# Patient Record
Sex: Female | Born: 1940
Health system: Southern US, Community
[De-identification: ages and names within clinical notes are randomized; demographics above are authoritative.]

## PROBLEM LIST (undated history)

## (undated) DIAGNOSIS — N183 Chronic kidney disease, stage 3 (moderate): Secondary | ICD-10-CM

## (undated) DIAGNOSIS — K5790 Diverticulosis of intestine, part unspecified, without perforation or abscess without bleeding: Secondary | ICD-10-CM

## (undated) DIAGNOSIS — Z9889 Other specified postprocedural states: Secondary | ICD-10-CM

## (undated) DIAGNOSIS — N189 Chronic kidney disease, unspecified: Secondary | ICD-10-CM

## (undated) DIAGNOSIS — K760 Fatty (change of) liver, not elsewhere classified: Secondary | ICD-10-CM

## (undated) DIAGNOSIS — T8149XA Infection following a procedure, other surgical site, initial encounter: Secondary | ICD-10-CM

## (undated) DIAGNOSIS — U071 COVID-19: Secondary | ICD-10-CM

## (undated) DIAGNOSIS — N179 Acute kidney failure, unspecified: Secondary | ICD-10-CM

## (undated) DIAGNOSIS — K5792 Diverticulitis of intestine, part unspecified, without perforation or abscess without bleeding: Secondary | ICD-10-CM

## (undated) DIAGNOSIS — I10 Essential (primary) hypertension: Secondary | ICD-10-CM

## (undated) DIAGNOSIS — M48061 Spinal stenosis, lumbar region without neurogenic claudication: Secondary | ICD-10-CM

## (undated) DIAGNOSIS — D62 Acute posthemorrhagic anemia: Secondary | ICD-10-CM

## (undated) DIAGNOSIS — E039 Hypothyroidism, unspecified: Secondary | ICD-10-CM

## (undated) DIAGNOSIS — D1779 Benign lipomatous neoplasm of other sites: Secondary | ICD-10-CM

## (undated) DIAGNOSIS — R5383 Other fatigue: Secondary | ICD-10-CM

## (undated) DIAGNOSIS — N185 Chronic kidney disease, stage 5: Secondary | ICD-10-CM

## (undated) DIAGNOSIS — M1990 Unspecified osteoarthritis, unspecified site: Secondary | ICD-10-CM

## (undated) DIAGNOSIS — E119 Type 2 diabetes mellitus without complications: Secondary | ICD-10-CM

## (undated) DIAGNOSIS — I700 Atherosclerosis of aorta: Secondary | ICD-10-CM

## (undated) DIAGNOSIS — R4182 Altered mental status, unspecified: Secondary | ICD-10-CM

## (undated) DIAGNOSIS — R001 Bradycardia, unspecified: Secondary | ICD-10-CM

## (undated) DIAGNOSIS — E1129 Type 2 diabetes mellitus with other diabetic kidney complication: Secondary | ICD-10-CM

## (undated) DIAGNOSIS — K869 Disease of pancreas, unspecified: Secondary | ICD-10-CM

## (undated) DIAGNOSIS — N200 Calculus of kidney: Secondary | ICD-10-CM

## (undated) DIAGNOSIS — D649 Anemia, unspecified: Secondary | ICD-10-CM

## (undated) DIAGNOSIS — T148XXD Other injury of unspecified body region, subsequent encounter: Secondary | ICD-10-CM

## (undated) DIAGNOSIS — D174 Benign lipomatous neoplasm of intrathoracic organs: Secondary | ICD-10-CM

## (undated) DIAGNOSIS — R7989 Other specified abnormal findings of blood chemistry: Secondary | ICD-10-CM

## (undated) DIAGNOSIS — F0390 Unspecified dementia without behavioral disturbance: Secondary | ICD-10-CM

## (undated) DIAGNOSIS — R600 Localized edema: Secondary | ICD-10-CM

## (undated) HISTORY — DX: COVID-19: U07.1

## (undated) HISTORY — DX: Unspecified dementia, unspecified severity, without behavioral disturbance, psychotic disturbance, mood disturbance, and anxiety: F03.90

## (undated) HISTORY — PX: DILATION AND CURETTAGE OF UTERUS: SHX78

## (undated) HISTORY — DX: Benign lipomatous neoplasm of other sites: D17.79

## (undated) HISTORY — DX: Benign lipomatous neoplasm of intrathoracic organs: D17.4

## (undated) HISTORY — DX: Acute posthemorrhagic anemia: D62

## (undated) HISTORY — DX: Calculus of kidney: N20.0

## (undated) HISTORY — DX: Bradycardia, unspecified: R00.1

## (undated) HISTORY — DX: Other fatigue: R53.83

## (undated) HISTORY — DX: Chronic kidney disease, stage 3 (moderate): N18.3

## (undated) HISTORY — DX: Infection following a procedure, other surgical site, initial encounter: T81.49XA

## (undated) HISTORY — PX: COLON SURGERY: SHX602

## (undated) HISTORY — DX: Diverticulosis of intestine, part unspecified, without perforation or abscess without bleeding: K57.90

## (undated) HISTORY — DX: Disease of pancreas, unspecified: K86.9

## (undated) HISTORY — DX: Altered mental status, unspecified: R41.82

## (undated) HISTORY — PX: HERNIA REPAIR: SHX51

## (undated) HISTORY — DX: Fatty (change of) liver, not elsewhere classified: K76.0

## (undated) HISTORY — DX: Acute kidney failure, unspecified: N17.9

## (undated) HISTORY — PX: CATARACT EXTRACTION W/ INTRAOCULAR LENS IMPLANT: SHX1309

## (undated) HISTORY — DX: Morbid (severe) obesity due to excess calories: E66.01

## (undated) HISTORY — DX: Atherosclerosis of aorta: I70.0

## (undated) HISTORY — DX: Diverticulitis of intestine, part unspecified, without perforation or abscess without bleeding: K57.92

## (undated) HISTORY — DX: Localized edema: R60.0

## (undated) HISTORY — PX: APPENDECTOMY: SHX54

## (undated) HISTORY — PX: TUBAL LIGATION: SHX77

## (undated) HISTORY — DX: Chronic kidney disease, stage 5: N18.5

## (undated) HISTORY — DX: Spinal stenosis, lumbar region without neurogenic claudication: M48.061

## (undated) HISTORY — DX: Other specified abnormal findings of blood chemistry: R79.89

## (undated) HISTORY — PX: PARTIAL COLECTOMY: SHX5273

## (undated) HISTORY — DX: Type 2 diabetes mellitus with other diabetic kidney complication: E11.29

## (undated) HISTORY — DX: Other specified postprocedural states: Z98.890

---

## 1980-08-04 HISTORY — PX: CHOLECYSTECTOMY OPEN: SUR202

## 1985-08-04 HISTORY — PX: ABDOMINAL HYSTERECTOMY: SHX81

## 1990-08-04 HISTORY — PX: REDUCTION MAMMAPLASTY: SUR839

## 2004-07-04 ENCOUNTER — Emergency Department: Payer: Self-pay | Admitting: Emergency Medicine

## 2005-03-11 ENCOUNTER — Ambulatory Visit: Payer: Self-pay | Admitting: Endocrinology

## 2006-04-13 ENCOUNTER — Ambulatory Visit: Payer: Self-pay | Admitting: Urology

## 2006-04-20 ENCOUNTER — Emergency Department: Payer: Self-pay | Admitting: Emergency Medicine

## 2008-10-12 ENCOUNTER — Ambulatory Visit: Payer: Self-pay | Admitting: General Surgery

## 2011-08-05 HISTORY — PX: BACK SURGERY: SHX140

## 2012-01-14 ENCOUNTER — Ambulatory Visit: Payer: Self-pay | Admitting: General Surgery

## 2012-01-15 ENCOUNTER — Ambulatory Visit: Payer: Self-pay | Admitting: General Surgery

## 2012-02-02 ENCOUNTER — Ambulatory Visit: Payer: Self-pay | Admitting: General Surgery

## 2012-07-08 ENCOUNTER — Other Ambulatory Visit: Payer: Self-pay | Admitting: Neurosurgery

## 2012-07-08 DIAGNOSIS — M5410 Radiculopathy, site unspecified: Secondary | ICD-10-CM

## 2012-07-21 DIAGNOSIS — K869 Disease of pancreas, unspecified: Secondary | ICD-10-CM | POA: Insufficient documentation

## 2012-07-26 ENCOUNTER — Ambulatory Visit
Admission: RE | Admit: 2012-07-26 | Discharge: 2012-07-26 | Disposition: A | Discharge status: Home / Self Care | Payer: Medicare Other | Source: Ambulatory Visit | Attending: Neurosurgery | Admitting: Neurosurgery

## 2012-07-26 DIAGNOSIS — M5410 Radiculopathy, site unspecified: Secondary | ICD-10-CM

## 2012-08-04 HISTORY — PX: VENTRAL HERNIA REPAIR: SHX424

## 2012-12-20 ENCOUNTER — Other Ambulatory Visit: Payer: Self-pay | Admitting: Neurosurgery

## 2012-12-20 DIAGNOSIS — M549 Dorsalgia, unspecified: Secondary | ICD-10-CM

## 2012-12-28 ENCOUNTER — Ambulatory Visit
Admission: RE | Admit: 2012-12-28 | Discharge: 2012-12-28 | Disposition: A | Discharge status: Home / Self Care | Payer: Medicare Other | Source: Ambulatory Visit | Attending: Neurosurgery | Admitting: Neurosurgery

## 2012-12-28 VITALS — BP 155/51 | HR 54 | Ht 62.0 in | Wt 215.0 lb

## 2012-12-28 DIAGNOSIS — M549 Dorsalgia, unspecified: Secondary | ICD-10-CM

## 2012-12-28 MED ORDER — ONDANSETRON HCL 4 MG/2ML IJ SOLN
4.0000 mg | Freq: Once | INTRAMUSCULAR | Status: AC
  Administered 2012-12-28: 4 mg via INTRAMUSCULAR

## 2012-12-28 MED ORDER — MEPERIDINE HCL 100 MG/ML IJ SOLN
100.0000 mg | Freq: Once | INTRAMUSCULAR | Status: AC
  Administered 2012-12-28: 100 mg via INTRAMUSCULAR

## 2012-12-28 MED ORDER — DIAZEPAM 5 MG PO TABS
5.0000 mg | ORAL_TABLET | Freq: Once | ORAL | Status: AC
  Administered 2012-12-28: 5 mg via ORAL

## 2012-12-28 MED ORDER — IOHEXOL 180 MG/ML  SOLN
15.0000 mL | Freq: Once | INTRAMUSCULAR | Status: AC | PRN
  Administered 2012-12-28: 15 mL via INTRATHECAL

## 2012-12-28 NOTE — Progress Notes (Signed)
Pt has been off tramadol for the past week.

## 2013-02-09 ENCOUNTER — Other Ambulatory Visit: Payer: Self-pay | Admitting: Neurosurgery

## 2013-02-17 ENCOUNTER — Encounter (HOSPITAL_COMMUNITY): Payer: Self-pay | Admitting: Pharmacy Technician

## 2013-02-22 ENCOUNTER — Encounter (HOSPITAL_COMMUNITY): Payer: Self-pay

## 2013-02-22 ENCOUNTER — Encounter (HOSPITAL_COMMUNITY)
Admission: RE | Admit: 2013-02-22 | Discharge: 2013-02-22 | Disposition: A | Discharge status: Home / Self Care | Payer: Medicare Other | Source: Ambulatory Visit | Attending: Neurosurgery | Admitting: Neurosurgery

## 2013-02-22 DIAGNOSIS — Z01812 Encounter for preprocedural laboratory examination: Secondary | ICD-10-CM | POA: Insufficient documentation

## 2013-02-22 DIAGNOSIS — Z01818 Encounter for other preprocedural examination: Secondary | ICD-10-CM | POA: Insufficient documentation

## 2013-02-22 HISTORY — DX: Type 2 diabetes mellitus without complications: E11.9

## 2013-02-22 HISTORY — DX: Essential (primary) hypertension: I10

## 2013-02-22 LAB — CBC
Hemoglobin: 11.8 g/dL — ABNORMAL LOW (ref 12.0–15.0)
MCH: 29.1 pg (ref 26.0–34.0)
MCV: 88.6 fL (ref 78.0–100.0)
RBC: 4.05 MIL/uL (ref 3.87–5.11)
WBC: 7.9 10*3/uL (ref 4.0–10.5)

## 2013-02-22 LAB — BASIC METABOLIC PANEL
CO2: 27 mEq/L (ref 19–32)
GFR calc non Af Amer: 48 mL/min — ABNORMAL LOW (ref >90)
Glucose, Bld: 96 mg/dL (ref 70–99)
Potassium: 4.6 mEq/L (ref 3.5–5.1)
Sodium: 138 mEq/L (ref 135–145)

## 2013-02-22 NOTE — Pre-Procedure Instructions (Signed)
Taylor Tyler  02/22/2013   Your procedure is scheduled on:  Tuesday, July 29th.  Report to [facility] at 8:00 AM.  Call this number if you have problems the morning of surgery: [phone]   Remember:   Do not eat food or drink liquids after midnight.   Take these medicines the morning of surgery with A SIP OF WATER: None. May take if needed:acetaminophen (TYLENOL) or traMADol [name]).  Stop taking Aspirin, Coumadin, Plavix, Effient and Herbal medications.  Do not take any NSAIDs ie: Ibuprofen,  Advil,Naproxen, nabumetone (RELAFEN) or any medication containing Aspirin.    Do not wear jewelry, make-up or nail polish.  Do not wear lotions, powders, or perfumes. You may wear deodorant.  Do not shave 48 hours prior to surgery.   Do not bring valuables to the hospital.  [facility] is not responsible for any belongings or valuables.  Contacts, dentures or bridgework may not be worn into surgery.  Leave suitcase in the car. After surgery it may be brought to your room.  For patients admitted to the hospital, checkout time is 11:00 AM the day of discharge.     Special Instructions: Shower using CHG 2 nights before surgery and the night before surgery.  If you shower the day of surgery use CHG.  Use special wash - you have one bottle of CHG for all showers.  You should use approximately 1/3 of the bottle for each shower.   Please read over the following fact sheets that you were given: Pain Booklet, Coughing and Deep Breathing and Surgical Site Infection Prevention

## 2013-02-23 ENCOUNTER — Encounter (HOSPITAL_COMMUNITY): Admission: RE | Admit: 2013-02-23 | Payer: Medicare Other | Source: Ambulatory Visit

## 2013-02-28 MED ORDER — CEFAZOLIN SODIUM-DEXTROSE 2-3 GM-% IV SOLR
2.0000 g | INTRAVENOUS | Status: AC
  Administered 2013-03-01: 2 g via INTRAVENOUS
  Filled 2013-02-28: qty 50

## 2013-03-01 ENCOUNTER — Inpatient Hospital Stay (HOSPITAL_COMMUNITY)
Admission: RE | Admit: 2013-03-01 | Discharge: 2013-03-02 | DRG: 460 | Disposition: A | Discharge status: Home / Self Care | Payer: Medicare Other | Source: Ambulatory Visit | Attending: Neurosurgery | Admitting: Neurosurgery

## 2013-03-01 ENCOUNTER — Inpatient Hospital Stay (HOSPITAL_COMMUNITY): Payer: Medicare Other

## 2013-03-01 ENCOUNTER — Encounter (HOSPITAL_COMMUNITY): Payer: Self-pay | Admitting: *Deleted

## 2013-03-01 ENCOUNTER — Encounter (HOSPITAL_COMMUNITY)
Admission: RE | Disposition: A | Discharge status: Home / Self Care | Payer: Self-pay | Source: Ambulatory Visit | Attending: Neurosurgery

## 2013-03-01 ENCOUNTER — Inpatient Hospital Stay (HOSPITAL_COMMUNITY): Payer: Medicare Other | Admitting: Anesthesiology

## 2013-03-01 ENCOUNTER — Encounter (HOSPITAL_COMMUNITY): Payer: Self-pay | Admitting: Anesthesiology

## 2013-03-01 DIAGNOSIS — I10 Essential (primary) hypertension: Secondary | ICD-10-CM | POA: Diagnosis present

## 2013-03-01 DIAGNOSIS — E119 Type 2 diabetes mellitus without complications: Secondary | ICD-10-CM | POA: Diagnosis present

## 2013-03-01 DIAGNOSIS — M48062 Spinal stenosis, lumbar region with neurogenic claudication: Principal | ICD-10-CM | POA: Diagnosis present

## 2013-03-01 HISTORY — PX: LUMBAR LAMINECTOMY WITH SPINOUS PROCESS PLATE 2 LEVEL: SHX5958

## 2013-03-01 LAB — GLUCOSE, CAPILLARY: Glucose-Capillary: 116 mg/dL — ABNORMAL HIGH (ref 70–99)

## 2013-03-01 SURGERY — LUMBAR LAMINECTOMY WITH SPINOUS PROCESS PLATE 2 LEVEL
Anesthesia: General | Site: Spine Lumbar | Wound class: Clean

## 2013-03-01 MED ORDER — DEXAMETHASONE SODIUM PHOSPHATE 4 MG/ML IJ SOLN
INTRAMUSCULAR | Status: DC | PRN
  Administered 2013-03-01: 10 mg via INTRAVENOUS

## 2013-03-01 MED ORDER — ONDANSETRON HCL 4 MG/2ML IJ SOLN
INTRAMUSCULAR | Status: DC | PRN
  Administered 2013-03-01: 4 mg via INTRAVENOUS

## 2013-03-01 MED ORDER — MIDAZOLAM HCL 5 MG/5ML IJ SOLN
INTRAMUSCULAR | Status: DC | PRN
  Administered 2013-03-01: 1 mg via INTRAVENOUS

## 2013-03-01 MED ORDER — FENTANYL CITRATE 0.05 MG/ML IJ SOLN
INTRAMUSCULAR | Status: DC | PRN
  Administered 2013-03-01 (×3): 50 ug via INTRAVENOUS

## 2013-03-01 MED ORDER — LISINOPRIL 20 MG PO TABS
20.0000 mg | ORAL_TABLET | Freq: Every day | ORAL | Status: DC
  Filled 2013-03-01: qty 1

## 2013-03-01 MED ORDER — HYDROMORPHONE HCL PF 1 MG/ML IJ SOLN: 0.2500 mg | INTRAMUSCULAR | Status: DC | PRN

## 2013-03-01 MED ORDER — ROCURONIUM BROMIDE 100 MG/10ML IV SOLN
INTRAVENOUS | Status: DC | PRN
  Administered 2013-03-01: 50 mg via INTRAVENOUS

## 2013-03-01 MED ORDER — CEFAZOLIN SODIUM-DEXTROSE 2-3 GM-% IV SOLR
2.0000 g | Freq: Three times a day (TID) | INTRAVENOUS | Status: AC
  Administered 2013-03-01 – 2013-03-02 (×2): 2 g via INTRAVENOUS
  Filled 2013-03-01 (×2): qty 50

## 2013-03-01 MED ORDER — HYDROCHLOROTHIAZIDE 12.5 MG PO CAPS
12.5000 mg | ORAL_CAPSULE | Freq: Every day | ORAL | Status: DC
  Filled 2013-03-01: qty 1

## 2013-03-01 MED ORDER — ONDANSETRON HCL 4 MG/2ML IJ SOLN: 4.0000 mg | INTRAMUSCULAR | Status: DC | PRN

## 2013-03-01 MED ORDER — GLYCOPYRROLATE 0.2 MG/ML IJ SOLN
INTRAMUSCULAR | Status: DC | PRN
  Administered 2013-03-01: .4 mg via INTRAVENOUS

## 2013-03-01 MED ORDER — SODIUM CHLORIDE 0.9 % IJ SOLN
3.0000 mL | Freq: Two times a day (BID) | INTRAMUSCULAR | Status: DC
  Administered 2013-03-01: 3 mL via INTRAVENOUS

## 2013-03-01 MED ORDER — FUROSEMIDE 20 MG PO TABS
20.0000 mg | ORAL_TABLET | Freq: Two times a day (BID) | ORAL | Status: DC
  Administered 2013-03-02: 20 mg via ORAL
  Filled 2013-03-01 (×4): qty 1

## 2013-03-01 MED ORDER — BACITRACIN 50000 UNITS IM SOLR
INTRAMUSCULAR | Status: AC
  Filled 2013-03-01: qty 1

## 2013-03-01 MED ORDER — SODIUM CHLORIDE 0.9 % IV SOLN
INTRAVENOUS | Status: AC
  Filled 2013-03-01: qty 500

## 2013-03-01 MED ORDER — LACTATED RINGERS IV SOLN
INTRAVENOUS | Status: DC
  Administered 2013-03-01: 09:00:00 via INTRAVENOUS

## 2013-03-01 MED ORDER — LIDOCAINE HCL 4 % MT SOLN
OROMUCOSAL | Status: DC | PRN
  Administered 2013-03-01: 4 mL via TOPICAL

## 2013-03-01 MED ORDER — PANTOPRAZOLE SODIUM 40 MG IV SOLR
40.0000 mg | Freq: Every day | INTRAVENOUS | Status: DC
  Administered 2013-03-01: 40 mg via INTRAVENOUS
  Filled 2013-03-01 (×2): qty 40

## 2013-03-01 MED ORDER — LISINOPRIL-HYDROCHLOROTHIAZIDE 20-12.5 MG PO TABS: 1.0000 | ORAL_TABLET | Freq: Every day | ORAL | Status: DC

## 2013-03-01 MED ORDER — PROPOFOL 10 MG/ML IV BOLUS
INTRAVENOUS | Status: DC | PRN
  Administered 2013-03-01: 160 mg via INTRAVENOUS

## 2013-03-01 MED ORDER — KETOROLAC TROMETHAMINE 30 MG/ML IJ SOLN
INTRAMUSCULAR | Status: AC
  Filled 2013-03-01: qty 1

## 2013-03-01 MED ORDER — BUPIVACAINE HCL (PF) 0.5 % IJ SOLN
INTRAMUSCULAR | Status: DC | PRN
  Administered 2013-03-01: 20 mL

## 2013-03-01 MED ORDER — MENTHOL 3 MG MT LOZG: 1.0000 | LOZENGE | OROMUCOSAL | Status: DC | PRN

## 2013-03-01 MED ORDER — ACETAMINOPHEN 325 MG PO TABS: 650.0000 mg | ORAL_TABLET | ORAL | Status: DC | PRN

## 2013-03-01 MED ORDER — HEMOSTATIC AGENTS (NO CHARGE) OPTIME
TOPICAL | Status: DC | PRN
  Administered 2013-03-01: 1 via TOPICAL

## 2013-03-01 MED ORDER — SODIUM CHLORIDE 0.9 % IV SOLN: 250.0000 mL | INTRAVENOUS | Status: DC

## 2013-03-01 MED ORDER — LIDOCAINE HCL (CARDIAC) 20 MG/ML IV SOLN
INTRAVENOUS | Status: DC | PRN
  Administered 2013-03-01: 20 mg via INTRAVENOUS

## 2013-03-01 MED ORDER — PHENOL 1.4 % MT LIQD: 1.0000 | OROMUCOSAL | Status: DC | PRN

## 2013-03-01 MED ORDER — METFORMIN HCL 500 MG PO TABS
500.0000 mg | ORAL_TABLET | Freq: Two times a day (BID) | ORAL | Status: DC
  Administered 2013-03-01 – 2013-03-02 (×2): 500 mg via ORAL
  Filled 2013-03-01 (×4): qty 1

## 2013-03-01 MED ORDER — THROMBIN 5000 UNITS EX SOLR
CUTANEOUS | Status: DC | PRN
  Administered 2013-03-01 (×2): 5000 [IU] via TOPICAL

## 2013-03-01 MED ORDER — NEOSTIGMINE METHYLSULFATE 1 MG/ML IJ SOLN
INTRAMUSCULAR | Status: DC | PRN
  Administered 2013-03-01: 3 mg via INTRAVENOUS

## 2013-03-01 MED ORDER — HYDROCODONE-ACETAMINOPHEN 5-325 MG PO TABS
1.0000 | ORAL_TABLET | ORAL | Status: DC | PRN
  Administered 2013-03-01 – 2013-03-02 (×2): 2 via ORAL
  Filled 2013-03-01 (×2): qty 2

## 2013-03-01 MED ORDER — SODIUM CHLORIDE 0.9 % IR SOLN
Status: DC | PRN
  Administered 2013-03-01: 13:00:00

## 2013-03-01 MED ORDER — PHENYLEPHRINE HCL 10 MG/ML IJ SOLN
INTRAMUSCULAR | Status: DC | PRN
  Administered 2013-03-01 (×2): 80 ug via INTRAVENOUS
  Administered 2013-03-01 (×2): 40 ug via INTRAVENOUS

## 2013-03-01 MED ORDER — SODIUM CHLORIDE 0.9 % IJ SOLN: 3.0000 mL | INTRAMUSCULAR | Status: DC | PRN

## 2013-03-01 MED ORDER — ACETAMINOPHEN 650 MG RE SUPP: 650.0000 mg | RECTAL | Status: DC | PRN

## 2013-03-01 MED ORDER — LACTATED RINGERS IV SOLN
INTRAVENOUS | Status: DC | PRN
  Administered 2013-03-01: 12:00:00 via INTRAVENOUS

## 2013-03-01 MED ORDER — POTASSIUM CHLORIDE CRYS ER 20 MEQ PO TBCR
20.0000 meq | EXTENDED_RELEASE_TABLET | Freq: Every day | ORAL | Status: DC
  Administered 2013-03-01: 20 meq via ORAL
  Filled 2013-03-01 (×2): qty 1

## 2013-03-01 MED ORDER — INSULIN ASPART 100 UNIT/ML ~~LOC~~ SOLN: 4.0000 [IU] | Freq: Three times a day (TID) | SUBCUTANEOUS | Status: DC

## 2013-03-01 MED ORDER — POTASSIUM CHLORIDE IN NACL 20-0.45 MEQ/L-% IV SOLN
INTRAVENOUS | Status: DC
  Administered 2013-03-01: 19:00:00 via INTRAVENOUS
  Filled 2013-03-01 (×3): qty 1000

## 2013-03-01 MED ORDER — 0.9 % SODIUM CHLORIDE (POUR BTL) OPTIME
TOPICAL | Status: DC | PRN
  Administered 2013-03-01: 1000 mL

## 2013-03-01 MED ORDER — CYCLOBENZAPRINE HCL 10 MG PO TABS
10.0000 mg | ORAL_TABLET | Freq: Three times a day (TID) | ORAL | Status: DC | PRN
  Administered 2013-03-01: 10 mg via ORAL
  Filled 2013-03-01: qty 1

## 2013-03-01 MED ORDER — THROMBIN 20000 UNITS EX SOLR
CUTANEOUS | Status: DC | PRN
  Administered 2013-03-01: 13:00:00 via TOPICAL

## 2013-03-01 MED ORDER — SIMVASTATIN 40 MG PO TABS
40.0000 mg | ORAL_TABLET | Freq: Every day | ORAL | Status: DC
Start: 2013-03-01 — End: 2013-03-02
  Administered 2013-03-01: 40 mg via ORAL
  Filled 2013-03-01 (×2): qty 1

## 2013-03-01 MED ORDER — KETOROLAC TROMETHAMINE 30 MG/ML IJ SOLN
30.0000 mg | Freq: Four times a day (QID) | INTRAMUSCULAR | Status: DC
  Administered 2013-03-01 – 2013-03-02 (×3): 30 mg via INTRAVENOUS
  Filled 2013-03-01 (×8): qty 1

## 2013-03-01 MED ORDER — INSULIN ASPART 100 UNIT/ML ~~LOC~~ SOLN: 0.0000 [IU] | Freq: Three times a day (TID) | SUBCUTANEOUS | Status: DC

## 2013-03-01 MED ORDER — HYDROMORPHONE HCL PF 1 MG/ML IJ SOLN: 1.0000 mg | INTRAMUSCULAR | Status: DC | PRN

## 2013-03-01 MED ORDER — INSULIN ASPART 100 UNIT/ML ~~LOC~~ SOLN: 0.0000 [IU] | Freq: Every day | SUBCUTANEOUS | Status: DC

## 2013-03-01 SURGICAL SUPPLY — 52 items
BAG DECANTER FOR FLEXI CONT (MISCELLANEOUS) ×2 IMPLANT
BENZOIN TINCTURE PRP APPL 2/3 (GAUZE/BANDAGES/DRESSINGS) ×2 IMPLANT
BLADE SURG ROTATE 9660 (MISCELLANEOUS) IMPLANT
BRUSH SCRUB EZ PLAIN DRY (MISCELLANEOUS) ×2 IMPLANT
BUR CUTTER 7.0 ROUND (BURR) ×2 IMPLANT
BUR MATCHSTICK NEURO 3.0 LAGG (BURR) ×2 IMPLANT
CANISTER SUCTION 2500CC (MISCELLANEOUS) ×2 IMPLANT
CLOTH BEACON ORANGE TIMEOUT ST (SAFETY) ×2 IMPLANT
CONT SPEC 4OZ CLIKSEAL STRL BL (MISCELLANEOUS) ×2 IMPLANT
DERMABOND ADHESIVE PROPEN (GAUZE/BANDAGES/DRESSINGS) ×1
DERMABOND ADVANCED (GAUZE/BANDAGES/DRESSINGS)
DERMABOND ADVANCED .7 DNX12 (GAUZE/BANDAGES/DRESSINGS) IMPLANT
DERMABOND ADVANCED .7 DNX6 (GAUZE/BANDAGES/DRESSINGS) ×1 IMPLANT
DEVICE COFLEX STABLIZATION 12M (Neuro Prosthesis/Implant) ×4 IMPLANT
DRAPE C-ARM 42X72 X-RAY (DRAPES) ×4 IMPLANT
DRAPE LAPAROTOMY 100X72X124 (DRAPES) ×2 IMPLANT
DRAPE MICROSCOPE LEICA (MISCELLANEOUS) ×2 IMPLANT
DRAPE MICROSCOPE ZEISS OPMI (DRAPES) IMPLANT
DRAPE SURG 17X23 STRL (DRAPES) ×4 IMPLANT
DRESSING TELFA 8X3 (GAUZE/BANDAGES/DRESSINGS) ×2 IMPLANT
ELECT REM PT RETURN 9FT ADLT (ELECTROSURGICAL) ×2
ELECTRODE REM PT RTRN 9FT ADLT (ELECTROSURGICAL) ×1 IMPLANT
GAUZE SPONGE 4X4 16PLY XRAY LF (GAUZE/BANDAGES/DRESSINGS) IMPLANT
GLOVE BIOGEL PI IND STRL 7.0 (GLOVE) ×2 IMPLANT
GLOVE BIOGEL PI IND STRL 7.5 (GLOVE) ×1 IMPLANT
GLOVE BIOGEL PI INDICATOR 7.0 (GLOVE) ×2
GLOVE BIOGEL PI INDICATOR 7.5 (GLOVE) ×1
GLOVE ECLIPSE 7.5 STRL STRAW (GLOVE) ×8 IMPLANT
GLOVE SS BIOGEL STRL SZ 6.5 (GLOVE) ×2 IMPLANT
GLOVE SUPERSENSE BIOGEL SZ 6.5 (GLOVE) ×2
GOWN BRE IMP SLV AUR LG STRL (GOWN DISPOSABLE) ×4 IMPLANT
GOWN BRE IMP SLV AUR XL STRL (GOWN DISPOSABLE) ×2 IMPLANT
GOWN STRL REIN 2XL LVL4 (GOWN DISPOSABLE) IMPLANT
KIT BASIN OR (CUSTOM PROCEDURE TRAY) ×2 IMPLANT
KIT ROOM TURNOVER OR (KITS) ×2 IMPLANT
NEEDLE HYPO 22GX1.5 SAFETY (NEEDLE) ×2 IMPLANT
NEEDLE SPNL 22GX3.5 QUINCKE BK (NEEDLE) ×2 IMPLANT
NS IRRIG 1000ML POUR BTL (IV SOLUTION) ×2 IMPLANT
PACK LAMINECTOMY NEURO (CUSTOM PROCEDURE TRAY) ×2 IMPLANT
PAD ARMBOARD 7.5X6 YLW CONV (MISCELLANEOUS) ×10 IMPLANT
PATTIES SURGICAL .75X.75 (GAUZE/BANDAGES/DRESSINGS) IMPLANT
RUBBERBAND STERILE (MISCELLANEOUS) ×4 IMPLANT
SPONGE GAUZE 4X4 12PLY (GAUZE/BANDAGES/DRESSINGS) ×2 IMPLANT
SPONGE SURGIFOAM ABS GEL 100C (HEMOSTASIS) ×2 IMPLANT
SPONGE SURGIFOAM ABS GEL SZ50 (HEMOSTASIS) ×2 IMPLANT
STRIP CLOSURE SKIN 1/2X4 (GAUZE/BANDAGES/DRESSINGS) ×4 IMPLANT
SUT VIC AB 2-0 OS6 18 (SUTURE) ×8 IMPLANT
SUT VIC AB 3-0 CP2 18 (SUTURE) ×2 IMPLANT
SYR 20ML ECCENTRIC (SYRINGE) ×2 IMPLANT
TOWEL OR 17X24 6PK STRL BLUE (TOWEL DISPOSABLE) ×2 IMPLANT
TOWEL OR 17X26 10 PK STRL BLUE (TOWEL DISPOSABLE) ×2 IMPLANT
WATER STERILE IRR 1000ML POUR (IV SOLUTION) ×2 IMPLANT

## 2013-03-01 NOTE — Preoperative (Signed)
Beta Blockers   Reason not to administer Beta Blockers:Not Applicable 

## 2013-03-01 NOTE — Anesthesia Procedure Notes (Addendum)
Procedure Name: Intubation Date/Time: 03/01/2013 11:48 AM Performed by: Mariea Clonts Pre-anesthesia Checklist: Patient identified, Emergency Drugs available, Suction available, Timeout performed and Patient being monitored Patient Re-evaluated:Patient Re-evaluated prior to inductionOxygen Delivery Method: Circle system utilized Preoxygenation: Pre-oxygenation with 100% oxygen Intubation Type: IV induction Ventilation: Mask ventilation without difficulty Laryngoscope Size: Miller and 2 Grade View: Grade II Tube type: Oral Tube size: 7.0 mm Number of attempts: 1 Airway Equipment and Method: Stylet and LTA kit utilized Placement Confirmation: ETT inserted through vocal cords under direct vision,  positive ETCO2,  CO2 detector and breath sounds checked- equal and bilateral Secured at: 23 cm Tube secured with: Tape Dental Injury: Teeth and Oropharynx as per pre-operative assessment

## 2013-03-01 NOTE — Anesthesia Postprocedure Evaluation (Signed)
  Anesthesia Post-op Note  Patient: Taylor Tyler  Procedure(s) Performed: Procedure(s) with comments: LUMBAR LAMINECTOMY/DECOMPRESSION MICRODISCECTOMY CoFlex (N/A) - Lumbar three-four,Lumbar Four-Five Laminectomy with Coflex  Patient Location: PACU  Anesthesia Type:General  Level of Consciousness: awake  Airway and Oxygen Therapy: Patient Spontanous Breathing  Post-op Pain: mild  Post-op Assessment: Post-op Vital signs reviewed  Post-op Vital Signs: Reviewed  Complications: No apparent anesthesia complications

## 2013-03-01 NOTE — H&P (Signed)
Taylor Tyler is an 72 y.o. female.   Chief Complaint: Back and bilateral leg pain HPI: The patient is a 72 year old female who followed for a number of months with back and bilateral leg pain. She's been tried on conservative therapy which has not given her any significant improvement. His x-ray scheduled for surgery the past but changed her mind. As the pain got progressively more severe and would not respond to conservative measures she underwent additional imaging studies which showed persistent severe stenosis at L3-4 and L4-5. After discussing the options the patient requested surgery now comes for a two-level decompression with Coflex intralaminar devices. I've had a long discussion with her regarding the risks and benefits of surgical intervention. The risks discussed include but are not limited to bleeding infection weakness numbness paralysis code instrumentation spinal fluid leakage coma and death. We have discussed alternative methods of therapy along with the risks and benefits of nonintervention. She's had the opportunity to ask numerous questions and appears to understand. With this information in hand she has requested we proceed with surgery.  Past Medical History  Diagnosis Date  . Hypertension   . Diabetes mellitus without complication     Past Surgical History  Procedure Laterality Date  . Breast surgery  1992    Breast Reduction  . Cholecystectomy  1982  . Abdominal hysterectomy  1987    History reviewed. No pertinent family history. Social History:  reports that she has never smoked. She has never used smokeless tobacco. She reports that she does not drink alcohol or use illicit drugs.  Allergies: No Known Allergies  Medications Prior to Admission  Medication Sig Dispense Refill  . acetaminophen (TYLENOL) 650 MG CR tablet Take 650 mg by mouth daily as needed for pain.      . cyclobenzaprine (FLEXERIL) 10 MG tablet Take 10 mg by mouth at bedtime.      . ergocalciferol  (VITAMIN D2) 50000 UNITS capsule Take 50,000 Units by mouth once a week. On Fridays      . furosemide (LASIX) 20 MG tablet Take 20 mg by mouth 2 (two) times daily.      Marland Kitchen lisinopril-hydrochlorothiazide (PRINZIDE,ZESTORETIC) 20-12.5 MG per tablet Take 1 tablet by mouth daily.      . metFORMIN (GLUCOPHAGE) 500 MG tablet Take 500 mg by mouth 2 (two) times daily with a meal.      . Multiple Vitamin (MULTIVITAMIN WITH MINERALS) TABS Take 1 tablet by mouth daily.      . nabumetone (RELAFEN) 500 MG tablet Take 500 mg by mouth 2 (two) times daily as needed for pain.      . naproxen sodium (ANAPROX) 220 MG tablet Take 440 mg by mouth daily as needed (pain).      . potassium chloride SA (K-DUR,KLOR-CON) 20 MEQ tablet Take 20 mEq by mouth daily.      . simvastatin (ZOCOR) 40 MG tablet Take 40 mg by mouth at bedtime.      . traMADol (ULTRAM) 50 MG tablet Take 50 mg by mouth 2 (two) times daily as needed for pain.        Results for orders placed during the hospital encounter of 03/01/13 (from the past 48 hour(s))  GLUCOSE, CAPILLARY     Status: None   Collection Time    03/01/13  8:15 AM      Result Value Range   Glucose-Capillary 75  70 - 99 mg/dL   Comment 1 Notify RN     No results found.  Review of systems not obtained due to patient factors.  Blood pressure 166/74, pulse 50, temperature 97.3 F (36.3 C), temperature source Oral, resp. rate 16, SpO2 100.00%.  The patient is awake or and oriented. She is no facial asymmetry. Her gait is slow mildly antalgic. Her reflexes are decreased but equal. She is normal strength and sensation. Assessment/Plan Impression is that of multi-factorial spinal stenosis at L3-4 and L4-5. The plan is for an L3 for an L4-5 decompression with Coflex intralaminar devices.  Faythe Ghee, MD 03/01/2013, 11:18 AM

## 2013-03-01 NOTE — Anesthesia Preprocedure Evaluation (Signed)
Anesthesia Evaluation  Patient identified by MRN, date of birth, ID band Patient awake    Reviewed: Allergy & Precautions, H&P , NPO status , Patient's Chart, lab work & pertinent test results  Airway Mallampati: II      Dental   Pulmonary neg pulmonary ROS,  breath sounds clear to auscultation        Cardiovascular hypertension, Pt. on medications Rhythm:Regular Rate:Normal     Neuro/Psych    GI/Hepatic negative GI ROS, Neg liver ROS,   Endo/Other  diabetes  Renal/GU negative Renal ROS     Musculoskeletal   Abdominal   Peds  Hematology   Anesthesia Other Findings   Reproductive/Obstetrics                           Anesthesia Physical Anesthesia Plan  ASA: III  Anesthesia Plan: General   Post-op Pain Management:    Induction: Intravenous  Airway Management Planned: Oral ETT  Additional Equipment:   Intra-op Plan:   Post-operative Plan: Extubation in OR  Informed Consent: I have reviewed the patients History and Physical, chart, labs and discussed the procedure including the risks, benefits and alternatives for the proposed anesthesia with the patient or authorized representative who has indicated his/her understanding and acceptance.   Dental advisory given  Plan Discussed with: CRNA, Anesthesiologist and Surgeon  Anesthesia Plan Comments:         Anesthesia Quick Evaluation

## 2013-03-01 NOTE — Op Note (Signed)
Preop diagnosis: Spinal stenosis L3-4 L4-5 Postop diagnosis: Same Procedure: Bilateral L3-4 and L4-5 decompressive laminectomy with L3-4 and L4-5 Coflex posterior instrumentation Surgeon: Buena Boehm Assistant: Luiz Ochoa  After being placed in the prone position the patient's back was prepped and draped in the usual sterile fashion. Localizing x-ray was taken prior to incision to identify the appropriate level. Midline incision was made above the spinous processes of L3-L4 and L5. Using Bovie cutting current the incision was carried on the spinous processes. Subperiosteal dissection was then carried out bilaterally on the spinous processes and lamina and self-retaining retractor was placed for exposure. X-ray showed approach the appropriate levels. Starting L3-4 on the left laminotomy was performed removing the inferior one half of the L3 lamina the medial one third of the facet joint the superior one third of the L4 lamina. Residual bone and hypertrophic flavum removed in a piecemeal fashion. Similar decompression was then carried on the right side. Midline structures underneath the spinous processes and between them were removed to complete the bilateral decompression while the same time we spared the spinous processes for the Coflex later in the case. We then turned our attention L4-5 were similar decompression was carried out. Once again under cut the spinous processes but did not injure them yet at the same time we did a complete decompression in the interspinous process space. At this time inspection was carried out of both levels for any evidence of residual compression and none could be identified. The disc was evaluated at both levels bilaterally and found not in need of removal. Irrigated copiously metabolic irrigation controlled any bleeding with upper coagulation and Gelfoam. We used the trials L3-4 and L4-5 for the Coflex device and chose to 12 mm devices. We first placed the 1 and L4-5 without  difficulty and crimped the spinous process weighing around the spinous process without difficulty. This was confirmed to be in good position AP lateral fluoroscopy. We then did the same thing at L3-4. We did final crimping at that level and final fluoroscopy AP lateral direction looked excellent. We irrigated once more and controlled any bleeding with upper coagulation Gelfoam. We did leave a drain in the epidural space is recommended for the Coflex. We then closed the wound in multiple layers of Vicryl on the muscle fascia subcutaneous and subcuticular tissues. Dermabond Steri-Strips were placed on the skin. A sterile dressing was then applied and the patient was extubated and taken to recovery room in stable condition.

## 2013-03-01 NOTE — Progress Notes (Signed)
Report given to Valentino Nose for care of patient

## 2013-03-01 NOTE — Transfer of Care (Signed)
Immediate Anesthesia Transfer of Care Note  Patient: Taylor Tyler  Procedure(s) Performed: Procedure(s) with comments: LUMBAR LAMINECTOMY/DECOMPRESSION MICRODISCECTOMY CoFlex (N/A) - Lumbar three-four,Lumbar Four-Five Laminectomy with Coflex  Patient Location: PACU  Anesthesia Type:General  Level of Consciousness: awake, alert  and oriented  Airway & Oxygen Therapy: Patient Spontanous Breathing and Patient connected to nasal cannula oxygen  Post-op Assessment: Report given to PACU RN and Post -op Vital signs reviewed and stable  Post vital signs: Reviewed and stable  Complications: No apparent anesthesia complications

## 2013-03-02 LAB — GLUCOSE, CAPILLARY
Glucose-Capillary: 148 mg/dL — ABNORMAL HIGH (ref 70–99)
Glucose-Capillary: 86 mg/dL (ref 70–99)

## 2013-03-02 MED ORDER — HYDROCODONE-ACETAMINOPHEN 5-325 MG PO TABS: 1.0000 | ORAL_TABLET | ORAL | Status: DC | PRN

## 2013-03-02 MED ORDER — PANTOPRAZOLE SODIUM 40 MG PO TBEC: 40.0000 mg | DELAYED_RELEASE_TABLET | Freq: Every day | ORAL | Status: DC

## 2013-03-02 NOTE — Discharge Summary (Signed)
  Physician Discharge Summary  [name] MRN: [id number] DOB/AGE: June 15, 1941 72 y.o.  Admit date: 03/01/2013 Discharge date: 03/02/2013  Admission Diagnoses:  Discharge Diagnoses:  Active Problems:   * No active hospital problems. *   Discharged Condition: good  Hospital Course: Surgery Tuesday for spinal stenosis. Did well. No pain post op. Ambulated well. Wound clean and dry. Home pod 1, specific instructions given.  Consults: None  Significant Diagnostic Studies: none  Treatments: surgery: L 34 L 45 decompression with coflex  Discharge Exam: Blood pressure 124/71, pulse 56, temperature 98.6 F (37 C), temperature source Oral, resp. rate 18, SpO2 97.00%. Incision/Wound:clean and dry  Disposition: Final discharge disposition not confirmed     Medication List    ASK your doctor about these medications       acetaminophen 650 MG CR tablet  Commonly known as:  TYLENOL  Take 650 mg by mouth daily as needed for pain.     cyclobenzaprine 10 MG tablet  Commonly known as:  FLEXERIL  Take 10 mg by mouth at bedtime.     ergocalciferol 50000 UNITS capsule  Commonly known as:  VITAMIN D2  Take 50,000 Units by mouth once a week. On Fridays     furosemide 20 MG tablet  Commonly known as:  LASIX  Take 20 mg by mouth 2 (two) times daily.     lisinopril-hydrochlorothiazide 20-12.5 MG per tablet  Commonly known as:  PRINZIDE,ZESTORETIC  Take 1 tablet by mouth daily.     metFORMIN 500 MG tablet  Commonly known as:  GLUCOPHAGE  Take 500 mg by mouth 2 (two) times daily with a meal.     multivitamin with minerals Tabs  Take 1 tablet by mouth daily.     nabumetone 500 MG tablet  Commonly known as:  RELAFEN  Take 500 mg by mouth 2 (two) times daily as needed for pain.     naproxen sodium 220 MG tablet  Commonly known as:  ANAPROX  Take 440 mg by mouth daily as needed (pain).     potassium chloride SA 20 MEQ tablet  Commonly known as:  K-DUR,KLOR-CON  Take  20 mEq by mouth daily.     simvastatin 40 MG tablet  Commonly known as:  ZOCOR  Take 40 mg by mouth at bedtime.     traMADol 50 MG tablet  Commonly known as:  ULTRAM  Take 50 mg by mouth 2 (two) times daily as needed for pain.         At home rest most of the time. Get up 9 or 10 times each day and take a 15 or 20 minute walk. No riding in the car and to your first postoperative appointment. If you have neck surgery you may shower from the chest down starting on the third postoperative day. If you had back surgery he may start showering on the third postoperative day with saran wrap wrapped around your incisional area 3 times. After the shower remove the saran wrap. Take pain medicine as needed and other medications as instructed. Call my office for an appointment.  SignedFaythe Ghee, MD 03/02/2013, 8:55 AM

## 2013-03-02 NOTE — Progress Notes (Signed)
Pt doing well. Pt given D/C instructions with Rx's, verbal understanding given. Pt D/C'd home with family @ 78 per MD order. Holli Humbles, RN

## 2013-03-04 ENCOUNTER — Encounter (HOSPITAL_COMMUNITY): Payer: Self-pay | Admitting: Neurosurgery

## 2013-07-12 ENCOUNTER — Emergency Department: Payer: Self-pay | Admitting: Emergency Medicine

## 2013-07-12 LAB — COMPREHENSIVE METABOLIC PANEL
Anion Gap: 3 — ABNORMAL LOW (ref 7–16)
Bilirubin,Total: 0.4 mg/dL (ref 0.2–1.0)
Creatinine: 1.01 mg/dL (ref 0.60–1.30)
EGFR (African American): >60
EGFR (Non-African Amer.): 56 — ABNORMAL LOW
Glucose: 91 mg/dL (ref 65–99)
Osmolality: 279 (ref 275–301)
Potassium: 4 mmol/L (ref 3.5–5.1)
SGOT(AST): 32 U/L (ref 15–37)
Sodium: 138 mmol/L (ref 136–145)

## 2013-07-12 LAB — CBC
HCT: 36.4 % (ref 35.0–47.0)
MCHC: 31.8 g/dL — ABNORMAL LOW (ref 32.0–36.0)
Platelet: 265 10*3/uL (ref 150–440)
RDW: 14.3 % (ref 11.5–14.5)

## 2013-07-12 LAB — PROTIME-INR
INR: 1.1
Prothrombin Time: 14 secs (ref 11.5–14.7)

## 2013-07-12 LAB — APTT: Activated PTT: 30.5 secs (ref 23.6–35.9)

## 2013-07-14 ENCOUNTER — Ambulatory Visit: Payer: Self-pay | Admitting: Endocrinology

## 2013-12-14 ENCOUNTER — Inpatient Hospital Stay: Payer: Self-pay | Admitting: Internal Medicine

## 2013-12-14 LAB — URINALYSIS, COMPLETE
BILIRUBIN, UR: NEGATIVE
GLUCOSE, UR: NEGATIVE mg/dL (ref 0–75)
KETONE: NEGATIVE
Nitrite: NEGATIVE
PH: 5 (ref 4.5–8.0)
PROTEIN: NEGATIVE
RBC,UR: 1071 /HPF (ref 0–5)
SQUAMOUS EPITHELIAL: NONE SEEN
Specific Gravity: 1.012 (ref 1.003–1.030)

## 2013-12-14 LAB — COMPREHENSIVE METABOLIC PANEL
ALBUMIN: 3.1 g/dL — AB (ref 3.4–5.0)
ALK PHOS: 66 U/L
AST: 46 U/L — AB (ref 15–37)
Anion Gap: 8 (ref 7–16)
BUN: 33 mg/dL — AB (ref 7–18)
Bilirubin,Total: 0.4 mg/dL (ref 0.2–1.0)
CHLORIDE: 107 mmol/L (ref 98–107)
CO2: 24 mmol/L (ref 21–32)
CREATININE: 0.96 mg/dL (ref 0.60–1.30)
Calcium, Total: 9.7 mg/dL (ref 8.5–10.1)
EGFR (Non-African Amer.): 60 — ABNORMAL LOW
GLUCOSE: 110 mg/dL — AB (ref 65–99)
Osmolality: 285 (ref 275–301)
Potassium: 4.2 mmol/L (ref 3.5–5.1)
SGPT (ALT): 47 U/L (ref 12–78)
SODIUM: 139 mmol/L (ref 136–145)
TOTAL PROTEIN: 7.6 g/dL (ref 6.4–8.2)

## 2013-12-14 LAB — CBC WITH DIFFERENTIAL/PLATELET
BASOS ABS: 0.1 10*3/uL (ref 0.0–0.1)
Basophil %: 0.4 %
Eosinophil #: 0 10*3/uL (ref 0.0–0.7)
Eosinophil %: 0.3 %
HCT: 31.1 % — AB (ref 35.0–47.0)
HGB: 9.9 g/dL — AB (ref 12.0–16.0)
LYMPHS PCT: 7.4 %
Lymphocyte #: 1 10*3/uL (ref 1.0–3.6)
MCH: 27.1 pg (ref 26.0–34.0)
MCHC: 32 g/dL (ref 32.0–36.0)
MCV: 85 fL (ref 80–100)
MONO ABS: 1.1 x10 3/mm — AB (ref 0.2–0.9)
MONOS PCT: 8.2 %
Neutrophil #: 10.9 10*3/uL — ABNORMAL HIGH (ref 1.4–6.5)
Neutrophil %: 83.7 %
Platelet: 223 10*3/uL (ref 150–440)
RBC: 3.66 10*6/uL — ABNORMAL LOW (ref 3.80–5.20)
RDW: 12.6 % (ref 11.5–14.5)
WBC: 13 10*3/uL — ABNORMAL HIGH (ref 3.6–11.0)

## 2013-12-14 LAB — TROPONIN I

## 2013-12-14 LAB — LIPASE, BLOOD: Lipase: 251 U/L (ref 73–393)

## 2013-12-15 LAB — BASIC METABOLIC PANEL
ANION GAP: 8 (ref 7–16)
BUN: 31 mg/dL — AB (ref 7–18)
CALCIUM: 8.8 mg/dL (ref 8.5–10.1)
CREATININE: 1.06 mg/dL (ref 0.60–1.30)
Chloride: 107 mmol/L (ref 98–107)
Co2: 24 mmol/L (ref 21–32)
EGFR (Non-African Amer.): 53 — ABNORMAL LOW
GLUCOSE: 94 mg/dL (ref 65–99)
Osmolality: 284 (ref 275–301)
POTASSIUM: 4.4 mmol/L (ref 3.5–5.1)
SODIUM: 139 mmol/L (ref 136–145)

## 2013-12-15 LAB — IRON AND TIBC
IRON SATURATION: 7 %
IRON: 12 ug/dL — AB (ref 50–170)
Iron Bind.Cap.(Total): 171 ug/dL — ABNORMAL LOW (ref 250–450)
Unbound Iron-Bind.Cap.: 159 ug/dL

## 2013-12-15 LAB — CBC WITH DIFFERENTIAL/PLATELET
BASOS ABS: 0 10*3/uL (ref 0.0–0.1)
BASOS PCT: 0.1 %
EOS PCT: 0 %
Eosinophil #: 0 10*3/uL (ref 0.0–0.7)
HCT: 26.9 % — AB (ref 35.0–47.0)
HGB: 8.7 g/dL — AB (ref 12.0–16.0)
LYMPHS PCT: 5.9 %
Lymphocyte #: 1 10*3/uL (ref 1.0–3.6)
MCH: 27.2 pg (ref 26.0–34.0)
MCHC: 32.4 g/dL (ref 32.0–36.0)
MCV: 84 fL (ref 80–100)
MONOS PCT: 8.1 %
Monocyte #: 1.4 x10 3/mm — ABNORMAL HIGH (ref 0.2–0.9)
NEUTROS ABS: 14.7 10*3/uL — AB (ref 1.4–6.5)
NEUTROS PCT: 85.9 %
PLATELETS: 191 10*3/uL (ref 150–440)
RBC: 3.2 10*6/uL — ABNORMAL LOW (ref 3.80–5.20)
RDW: 12.5 % (ref 11.5–14.5)
WBC: 17.1 10*3/uL — ABNORMAL HIGH (ref 3.6–11.0)

## 2013-12-15 LAB — FERRITIN: FERRITIN (ARMC): 228 ng/mL (ref 8–388)

## 2013-12-16 LAB — CBC WITH DIFFERENTIAL/PLATELET
Basophil #: 0 10*3/uL (ref 0.0–0.1)
Basophil %: 0.1 %
EOS ABS: 0 10*3/uL (ref 0.0–0.7)
Eosinophil %: 0 %
HCT: 26.6 % — ABNORMAL LOW (ref 35.0–47.0)
HGB: 8.5 g/dL — AB (ref 12.0–16.0)
LYMPHS PCT: 7.5 %
Lymphocyte #: 1.3 10*3/uL (ref 1.0–3.6)
MCH: 26.7 pg (ref 26.0–34.0)
MCHC: 31.9 g/dL — ABNORMAL LOW (ref 32.0–36.0)
MCV: 84 fL (ref 80–100)
MONO ABS: 1.2 x10 3/mm — AB (ref 0.2–0.9)
Monocyte %: 6.7 %
NEUTROS ABS: 14.7 10*3/uL — AB (ref 1.4–6.5)
Neutrophil %: 85.7 %
PLATELETS: 181 10*3/uL (ref 150–440)
RBC: 3.18 10*6/uL — ABNORMAL LOW (ref 3.80–5.20)
RDW: 12.5 % (ref 11.5–14.5)
WBC: 17.1 10*3/uL — AB (ref 3.6–11.0)

## 2013-12-17 LAB — CBC WITH DIFFERENTIAL/PLATELET
BASOS ABS: 0 10*3/uL (ref 0.0–0.1)
BASOS PCT: 0.1 %
Eosinophil #: 0 10*3/uL (ref 0.0–0.7)
Eosinophil %: 0.1 %
HCT: 26.4 % — ABNORMAL LOW (ref 35.0–47.0)
HGB: 8.5 g/dL — AB (ref 12.0–16.0)
Lymphocyte #: 1.6 10*3/uL (ref 1.0–3.6)
Lymphocyte %: 9.4 %
MCH: 26.6 pg (ref 26.0–34.0)
MCHC: 32 g/dL (ref 32.0–36.0)
MCV: 83 fL (ref 80–100)
Monocyte #: 1.2 x10 3/mm — ABNORMAL HIGH (ref 0.2–0.9)
Monocyte %: 6.8 %
NEUTROS PCT: 83.6 %
Neutrophil #: 14.4 10*3/uL — ABNORMAL HIGH (ref 1.4–6.5)
Platelet: 227 10*3/uL (ref 150–440)
RBC: 3.18 10*6/uL — AB (ref 3.80–5.20)
RDW: 12.8 % (ref 11.5–14.5)
WBC: 17.3 10*3/uL — ABNORMAL HIGH (ref 3.6–11.0)

## 2013-12-17 LAB — URINE CULTURE

## 2013-12-18 LAB — BASIC METABOLIC PANEL
ANION GAP: 5 — AB (ref 7–16)
BUN: 24 mg/dL — ABNORMAL HIGH (ref 7–18)
CHLORIDE: 109 mmol/L — AB (ref 98–107)
CO2: 24 mmol/L (ref 21–32)
Calcium, Total: 8.7 mg/dL (ref 8.5–10.1)
Creatinine: 0.97 mg/dL (ref 0.60–1.30)
EGFR (African American): >60
EGFR (Non-African Amer.): 59 — ABNORMAL LOW
Glucose: 120 mg/dL — ABNORMAL HIGH (ref 65–99)
Osmolality: 281 (ref 275–301)
POTASSIUM: 3.1 mmol/L — AB (ref 3.5–5.1)
Sodium: 138 mmol/L (ref 136–145)

## 2013-12-18 LAB — CBC WITH DIFFERENTIAL/PLATELET
BASOS ABS: 0.1 10*3/uL (ref 0.0–0.1)
BASOS PCT: 0.5 %
EOS PCT: 0 %
Eosinophil #: 0 10*3/uL (ref 0.0–0.7)
HCT: 26.5 % — ABNORMAL LOW (ref 35.0–47.0)
HGB: 8.4 g/dL — ABNORMAL LOW (ref 12.0–16.0)
Lymphocyte #: 1.1 10*3/uL (ref 1.0–3.6)
Lymphocyte %: 8.9 %
MCH: 26.2 pg (ref 26.0–34.0)
MCHC: 31.8 g/dL — AB (ref 32.0–36.0)
MCV: 82 fL (ref 80–100)
MONOS PCT: 7.4 %
Monocyte #: 0.9 x10 3/mm (ref 0.2–0.9)
NEUTROS ABS: 9.9 10*3/uL — AB (ref 1.4–6.5)
Neutrophil %: 83.2 %
PLATELETS: 220 10*3/uL (ref 150–440)
RBC: 3.21 10*6/uL — AB (ref 3.80–5.20)
RDW: 12.8 % (ref 11.5–14.5)
WBC: 11.9 10*3/uL — AB (ref 3.6–11.0)

## 2014-01-19 ENCOUNTER — Ambulatory Visit: Payer: Self-pay | Admitting: Endocrinology

## 2014-01-19 ENCOUNTER — Inpatient Hospital Stay: Payer: Self-pay | Admitting: Surgery

## 2014-01-19 LAB — COMPREHENSIVE METABOLIC PANEL
ALT: 32 U/L (ref 12–78)
ANION GAP: 8 (ref 7–16)
Albumin: 3.1 g/dL — ABNORMAL LOW (ref 3.4–5.0)
Alkaline Phosphatase: 45 U/L
BUN: 26 mg/dL — AB (ref 7–18)
Bilirubin,Total: 0.3 mg/dL (ref 0.2–1.0)
CALCIUM: 9.6 mg/dL (ref 8.5–10.1)
Chloride: 107 mmol/L (ref 98–107)
Co2: 25 mmol/L (ref 21–32)
Creatinine: 1.65 mg/dL — ABNORMAL HIGH (ref 0.60–1.30)
EGFR (African American): 36 — ABNORMAL LOW
GFR CALC NON AF AMER: 31 — AB
GLUCOSE: 80 mg/dL (ref 65–99)
OSMOLALITY: 283 (ref 275–301)
POTASSIUM: 4.5 mmol/L (ref 3.5–5.1)
SGOT(AST): 63 U/L — ABNORMAL HIGH (ref 15–37)
SODIUM: 140 mmol/L (ref 136–145)
Total Protein: 7.9 g/dL (ref 6.4–8.2)

## 2014-01-19 LAB — TSH: Thyroid Stimulating Horm: <0.01 u[IU]/mL — ABNORMAL LOW

## 2014-01-19 LAB — CBC
HCT: 31.8 % — AB (ref 35.0–47.0)
HGB: 10.1 g/dL — AB (ref 12.0–16.0)
MCH: 26 pg (ref 26.0–34.0)
MCHC: 31.8 g/dL — AB (ref 32.0–36.0)
MCV: 82 fL (ref 80–100)
Platelet: 309 10*3/uL (ref 150–440)
RBC: 3.89 10*6/uL (ref 3.80–5.20)
RDW: 14.4 % (ref 11.5–14.5)
WBC: 7 10*3/uL (ref 3.6–11.0)

## 2014-01-19 LAB — T4, FREE: FREE THYROXINE: 4.35 ng/dL — AB (ref 0.76–1.46)

## 2014-01-20 LAB — CBC WITH DIFFERENTIAL/PLATELET
Basophil #: 0.1 10*3/uL (ref 0.0–0.1)
Basophil %: 0.7 %
EOS PCT: 0.1 %
Eosinophil #: 0 10*3/uL (ref 0.0–0.7)
HCT: 28.9 % — ABNORMAL LOW (ref 35.0–47.0)
HGB: 9.3 g/dL — AB (ref 12.0–16.0)
LYMPHS PCT: 27.8 %
Lymphocyte #: 2 10*3/uL (ref 1.0–3.6)
MCH: 26.5 pg (ref 26.0–34.0)
MCHC: 32.3 g/dL (ref 32.0–36.0)
MCV: 82 fL (ref 80–100)
Monocyte #: 1 x10 3/mm — ABNORMAL HIGH (ref 0.2–0.9)
Monocyte %: 13.6 %
NEUTROS PCT: 57.8 %
Neutrophil #: 4.2 10*3/uL (ref 1.4–6.5)
PLATELETS: 278 10*3/uL (ref 150–440)
RBC: 3.52 10*6/uL — ABNORMAL LOW (ref 3.80–5.20)
RDW: 14.2 % (ref 11.5–14.5)
WBC: 7.2 10*3/uL (ref 3.6–11.0)

## 2014-01-20 LAB — COMPREHENSIVE METABOLIC PANEL
ALBUMIN: 2.8 g/dL — AB (ref 3.4–5.0)
ALT: 27 U/L (ref 12–78)
AST: 41 U/L — AB (ref 15–37)
Alkaline Phosphatase: 41 U/L — ABNORMAL LOW
Anion Gap: 8 (ref 7–16)
BUN: 23 mg/dL — AB (ref 7–18)
Bilirubin,Total: 0.3 mg/dL (ref 0.2–1.0)
CALCIUM: 9.3 mg/dL (ref 8.5–10.1)
CO2: 24 mmol/L (ref 21–32)
CREATININE: 1.4 mg/dL — AB (ref 0.60–1.30)
Chloride: 110 mmol/L — ABNORMAL HIGH (ref 98–107)
EGFR (African American): 44 — ABNORMAL LOW
GFR CALC NON AF AMER: 38 — AB
Glucose: 89 mg/dL (ref 65–99)
Osmolality: 286 (ref 275–301)
POTASSIUM: 3.7 mmol/L (ref 3.5–5.1)
SODIUM: 142 mmol/L (ref 136–145)
TOTAL PROTEIN: 7 g/dL (ref 6.4–8.2)

## 2014-01-22 LAB — T4, FREE: Free Thyroxine: 2.75 ng/dL — ABNORMAL HIGH (ref 0.76–1.46)

## 2014-01-22 LAB — TSH: Thyroid Stimulating Horm: <0.01 u[IU]/mL — ABNORMAL LOW

## 2014-01-23 LAB — CBC WITH DIFFERENTIAL/PLATELET
BASOS ABS: 0 10*3/uL (ref 0.0–0.1)
BASOS PCT: 0.2 %
EOS ABS: 0.1 10*3/uL (ref 0.0–0.7)
Eosinophil %: 1 %
HCT: 27 % — AB (ref 35.0–47.0)
HGB: 8.4 g/dL — AB (ref 12.0–16.0)
Lymphocyte #: 1.3 10*3/uL (ref 1.0–3.6)
Lymphocyte %: 26.3 %
MCH: 25.4 pg — ABNORMAL LOW (ref 26.0–34.0)
MCHC: 31 g/dL — AB (ref 32.0–36.0)
MCV: 82 fL (ref 80–100)
Monocyte #: 0.8 x10 3/mm (ref 0.2–0.9)
Monocyte %: 16.3 %
NEUTROS PCT: 56.2 %
Neutrophil #: 2.9 10*3/uL (ref 1.4–6.5)
Platelet: 199 10*3/uL (ref 150–440)
RBC: 3.3 10*6/uL — AB (ref 3.80–5.20)
RDW: 14.3 % (ref 11.5–14.5)
WBC: 5.1 10*3/uL (ref 3.6–11.0)

## 2014-01-23 LAB — BASIC METABOLIC PANEL
ANION GAP: 9 (ref 7–16)
BUN: 8 mg/dL (ref 7–18)
CHLORIDE: 111 mmol/L — AB (ref 98–107)
Calcium, Total: 9.3 mg/dL (ref 8.5–10.1)
Co2: 23 mmol/L (ref 21–32)
Creatinine: 0.82 mg/dL (ref 0.60–1.30)
EGFR (Non-African Amer.): >60
Glucose: 69 mg/dL (ref 65–99)
OSMOLALITY: 282 (ref 275–301)
Potassium: 3.4 mmol/L — ABNORMAL LOW (ref 3.5–5.1)
SODIUM: 143 mmol/L (ref 136–145)

## 2014-01-23 LAB — T4, FREE: FREE THYROXINE: 2.94 ng/dL — AB (ref 0.76–1.46)

## 2014-01-25 LAB — HEPATIC FUNCTION PANEL A (ARMC)
ALBUMIN: 2.6 g/dL — AB (ref 3.4–5.0)
ALT: 38 U/L (ref 12–78)
Alkaline Phosphatase: 57 U/L
Bilirubin, Direct: <0.1 mg/dL (ref 0.00–0.20)
Bilirubin,Total: 0.4 mg/dL (ref 0.2–1.0)
SGOT(AST): 77 U/L — ABNORMAL HIGH (ref 15–37)
TOTAL PROTEIN: 6.4 g/dL (ref 6.4–8.2)

## 2014-01-25 LAB — BASIC METABOLIC PANEL
Anion Gap: 10 (ref 7–16)
BUN: 5 mg/dL — AB (ref 7–18)
CHLORIDE: 106 mmol/L (ref 98–107)
CO2: 26 mmol/L (ref 21–32)
Calcium, Total: 9.1 mg/dL (ref 8.5–10.1)
Creatinine: 0.74 mg/dL (ref 0.60–1.30)
EGFR (African American): >60
Glucose: 78 mg/dL (ref 65–99)
OSMOLALITY: 279 (ref 275–301)
Potassium: 3.3 mmol/L — ABNORMAL LOW (ref 3.5–5.1)
Sodium: 142 mmol/L (ref 136–145)

## 2014-01-25 LAB — CBC WITH DIFFERENTIAL/PLATELET
BASOS PCT: 0.1 %
Basophil #: 0 10*3/uL (ref 0.0–0.1)
Eosinophil #: 0.1 10*3/uL (ref 0.0–0.7)
Eosinophil %: 1.2 %
HCT: 29.4 % — ABNORMAL LOW (ref 35.0–47.0)
HGB: 9.6 g/dL — ABNORMAL LOW (ref 12.0–16.0)
LYMPHS ABS: 1.7 10*3/uL (ref 1.0–3.6)
Lymphocyte %: 26.9 %
MCH: 26.5 pg (ref 26.0–34.0)
MCHC: 32.6 g/dL (ref 32.0–36.0)
MCV: 81 fL (ref 80–100)
MONOS PCT: 14 %
Monocyte #: 0.9 x10 3/mm (ref 0.2–0.9)
Neutrophil #: 3.7 10*3/uL (ref 1.4–6.5)
Neutrophil %: 57.8 %
Platelet: 217 10*3/uL (ref 150–440)
RBC: 3.62 10*6/uL — ABNORMAL LOW (ref 3.80–5.20)
RDW: 14.5 % (ref 11.5–14.5)
WBC: 6.4 10*3/uL (ref 3.6–11.0)

## 2014-01-25 LAB — T4, FREE: FREE THYROXINE: 2.25 ng/dL — AB (ref 0.76–1.46)

## 2014-01-26 HISTORY — PX: COLON SURGERY: SHX602

## 2014-01-26 LAB — T4, FREE: Free Thyroxine: 2.4 ng/dL — ABNORMAL HIGH (ref 0.76–1.46)

## 2014-01-27 LAB — COMPREHENSIVE METABOLIC PANEL
ALT: 43 U/L (ref 12–78)
AST: 71 U/L — AB (ref 15–37)
Albumin: 2.1 g/dL — ABNORMAL LOW (ref 3.4–5.0)
Alkaline Phosphatase: 41 U/L — ABNORMAL LOW
Anion Gap: 13 (ref 7–16)
BUN: 9 mg/dL (ref 7–18)
Bilirubin,Total: 0.4 mg/dL (ref 0.2–1.0)
CHLORIDE: 112 mmol/L — AB (ref 98–107)
Calcium, Total: 8.1 mg/dL — ABNORMAL LOW (ref 8.5–10.1)
Co2: 20 mmol/L — ABNORMAL LOW (ref 21–32)
Creatinine: 1.11 mg/dL (ref 0.60–1.30)
EGFR (African American): 58 — ABNORMAL LOW
GFR CALC NON AF AMER: 50 — AB
Glucose: 81 mg/dL (ref 65–99)
OSMOLALITY: 286 (ref 275–301)
Potassium: 3.8 mmol/L (ref 3.5–5.1)
Sodium: 145 mmol/L (ref 136–145)
TOTAL PROTEIN: 5.4 g/dL — AB (ref 6.4–8.2)

## 2014-01-27 LAB — CBC WITH DIFFERENTIAL/PLATELET
BASOS PCT: 0.1 %
Basophil #: 0 10*3/uL (ref 0.0–0.1)
EOS ABS: 0 10*3/uL (ref 0.0–0.7)
Eosinophil %: 0 %
HCT: 27.7 % — ABNORMAL LOW (ref 35.0–47.0)
HGB: 8.8 g/dL — ABNORMAL LOW (ref 12.0–16.0)
Lymphocyte #: 1.8 10*3/uL (ref 1.0–3.6)
Lymphocyte %: 13 %
MCH: 25.8 pg — AB (ref 26.0–34.0)
MCHC: 31.7 g/dL — ABNORMAL LOW (ref 32.0–36.0)
MCV: 81 fL (ref 80–100)
Monocyte #: 1.4 x10 3/mm — ABNORMAL HIGH (ref 0.2–0.9)
Monocyte %: 9.8 %
NEUTROS PCT: 77.1 %
Neutrophil #: 10.6 10*3/uL — ABNORMAL HIGH (ref 1.4–6.5)
Platelet: 230 10*3/uL (ref 150–440)
RBC: 3.42 10*6/uL — ABNORMAL LOW (ref 3.80–5.20)
RDW: 14.6 % — ABNORMAL HIGH (ref 11.5–14.5)
WBC: 13.7 10*3/uL — ABNORMAL HIGH (ref 3.6–11.0)

## 2014-01-27 LAB — MAGNESIUM: MAGNESIUM: 0.8 mg/dL — AB

## 2014-01-27 LAB — HM COLONOSCOPY

## 2014-01-28 LAB — BASIC METABOLIC PANEL
Anion Gap: 9 (ref 7–16)
BUN: 9 mg/dL (ref 7–18)
CO2: 24 mmol/L (ref 21–32)
Calcium, Total: 8.2 mg/dL — ABNORMAL LOW (ref 8.5–10.1)
Chloride: 109 mmol/L — ABNORMAL HIGH (ref 98–107)
Creatinine: 0.71 mg/dL (ref 0.60–1.30)
EGFR (Non-African Amer.): >60
GLUCOSE: 99 mg/dL (ref 65–99)
Osmolality: 282 (ref 275–301)
POTASSIUM: 3.9 mmol/L (ref 3.5–5.1)
Sodium: 142 mmol/L (ref 136–145)

## 2014-01-28 LAB — MAGNESIUM: Magnesium: 1.3 mg/dL — ABNORMAL LOW

## 2014-01-29 LAB — BASIC METABOLIC PANEL
Anion Gap: 8 (ref 7–16)
BUN: 9 mg/dL (ref 7–18)
CALCIUM: 8.5 mg/dL (ref 8.5–10.1)
CHLORIDE: 107 mmol/L (ref 98–107)
Co2: 26 mmol/L (ref 21–32)
Creatinine: 0.75 mg/dL (ref 0.60–1.30)
EGFR (African American): >60
GLUCOSE: 95 mg/dL (ref 65–99)
Osmolality: 280 (ref 275–301)
Potassium: 3.7 mmol/L (ref 3.5–5.1)
SODIUM: 141 mmol/L (ref 136–145)

## 2014-01-29 LAB — MAGNESIUM: MAGNESIUM: 1.8 mg/dL

## 2014-01-29 LAB — T4, FREE: Free Thyroxine: 1.89 ng/dL — ABNORMAL HIGH (ref 0.76–1.46)

## 2014-01-30 LAB — HEMOGLOBIN: HGB: 8 g/dL — ABNORMAL LOW (ref 12.0–16.0)

## 2014-01-30 LAB — MAGNESIUM: MAGNESIUM: 1.5 mg/dL — AB

## 2014-01-30 LAB — POTASSIUM: POTASSIUM: 3.6 mmol/L (ref 3.5–5.1)

## 2014-01-31 LAB — CBC WITH DIFFERENTIAL/PLATELET
BASOS PCT: 0.2 %
Basophil #: 0 10*3/uL (ref 0.0–0.1)
Eosinophil #: 0.1 10*3/uL (ref 0.0–0.7)
Eosinophil %: 1.1 %
HCT: 25 % — ABNORMAL LOW (ref 35.0–47.0)
HGB: 7.9 g/dL — ABNORMAL LOW (ref 12.0–16.0)
Lymphocyte #: 1.8 10*3/uL (ref 1.0–3.6)
Lymphocyte %: 29.1 %
MCH: 25.4 pg — ABNORMAL LOW (ref 26.0–34.0)
MCHC: 31.4 g/dL — ABNORMAL LOW (ref 32.0–36.0)
MCV: 81 fL (ref 80–100)
Monocyte #: 1 x10 3/mm — ABNORMAL HIGH (ref 0.2–0.9)
Monocyte %: 16.2 %
NEUTROS ABS: 3.3 10*3/uL (ref 1.4–6.5)
NEUTROS PCT: 53.4 %
Platelet: 275 10*3/uL (ref 150–440)
RBC: 3.1 10*6/uL — AB (ref 3.80–5.20)
RDW: 15.6 % — ABNORMAL HIGH (ref 11.5–14.5)
WBC: 6.2 10*3/uL (ref 3.6–11.0)

## 2014-01-31 LAB — POTASSIUM: Potassium: 3.6 mmol/L (ref 3.5–5.1)

## 2014-01-31 LAB — MAGNESIUM: MAGNESIUM: 1.5 mg/dL — AB

## 2014-01-31 LAB — PATHOLOGY REPORT

## 2014-04-20 ENCOUNTER — Encounter (INDEPENDENT_AMBULATORY_CARE_PROVIDER_SITE_OTHER): Payer: Medicare Other | Admitting: Ophthalmology

## 2014-04-20 DIAGNOSIS — E1139 Type 2 diabetes mellitus with other diabetic ophthalmic complication: Secondary | ICD-10-CM

## 2014-04-20 DIAGNOSIS — E1165 Type 2 diabetes mellitus with hyperglycemia: Secondary | ICD-10-CM

## 2014-04-20 DIAGNOSIS — H35039 Hypertensive retinopathy, unspecified eye: Secondary | ICD-10-CM | POA: Diagnosis not present

## 2014-04-20 DIAGNOSIS — H348192 Central retinal vein occlusion, unspecified eye, stable: Secondary | ICD-10-CM

## 2014-04-20 DIAGNOSIS — H43819 Vitreous degeneration, unspecified eye: Secondary | ICD-10-CM

## 2014-04-20 DIAGNOSIS — I10 Essential (primary) hypertension: Secondary | ICD-10-CM | POA: Diagnosis not present

## 2014-04-20 DIAGNOSIS — E11319 Type 2 diabetes mellitus with unspecified diabetic retinopathy without macular edema: Secondary | ICD-10-CM

## 2014-04-21 ENCOUNTER — Observation Stay: Payer: Self-pay | Admitting: Specialist

## 2014-04-21 LAB — CBC
HCT: 31.3 % — AB (ref 35.0–47.0)
HGB: 10 g/dL — AB (ref 12.0–16.0)
MCH: 28.9 pg (ref 26.0–34.0)
MCHC: 32 g/dL (ref 32.0–36.0)
MCV: 90 fL (ref 80–100)
PLATELETS: 245 10*3/uL (ref 150–440)
RBC: 3.47 10*6/uL — ABNORMAL LOW (ref 3.80–5.20)
RDW: 18.1 % — AB (ref 11.5–14.5)
WBC: 6.1 10*3/uL (ref 3.6–11.0)

## 2014-04-21 LAB — BASIC METABOLIC PANEL
ANION GAP: 9 (ref 7–16)
BUN: 25 mg/dL — ABNORMAL HIGH (ref 7–18)
CALCIUM: 8.6 mg/dL (ref 8.5–10.1)
CO2: 22 mmol/L (ref 21–32)
Chloride: 108 mmol/L — ABNORMAL HIGH (ref 98–107)
Creatinine: 1.13 mg/dL (ref 0.60–1.30)
EGFR (African American): 57 — ABNORMAL LOW
EGFR (Non-African Amer.): 49 — ABNORMAL LOW
GLUCOSE: 82 mg/dL (ref 65–99)
OSMOLALITY: 281 (ref 275–301)
POTASSIUM: 4.1 mmol/L (ref 3.5–5.1)
SODIUM: 139 mmol/L (ref 136–145)

## 2014-04-21 LAB — CK-MB: CK-MB: 1 ng/mL (ref 0.5–3.6)

## 2014-04-21 LAB — TROPONIN I: Troponin-I: <0.02 ng/mL

## 2014-04-21 LAB — SEDIMENTATION RATE: Erythrocyte Sed Rate: 39 mm/hr — ABNORMAL HIGH (ref 0–30)

## 2014-04-22 LAB — CK-MB: CK-MB: 1 ng/mL (ref 0.5–3.6)

## 2014-04-22 LAB — BASIC METABOLIC PANEL
Anion Gap: 8 (ref 7–16)
BUN: 27 mg/dL — AB (ref 7–18)
CO2: 24 mmol/L (ref 21–32)
Calcium, Total: 8.5 mg/dL (ref 8.5–10.1)
Chloride: 108 mmol/L — ABNORMAL HIGH (ref 98–107)
Creatinine: 1.09 mg/dL (ref 0.60–1.30)
GFR CALC AF AMER: 59 — AB
GFR CALC NON AF AMER: 51 — AB
GLUCOSE: 78 mg/dL (ref 65–99)
OSMOLALITY: 283 (ref 275–301)
POTASSIUM: 4 mmol/L (ref 3.5–5.1)
SODIUM: 140 mmol/L (ref 136–145)

## 2014-04-22 LAB — CBC WITH DIFFERENTIAL/PLATELET
BASOS PCT: 0.7 %
Basophil #: 0 10*3/uL (ref 0.0–0.1)
EOS ABS: 0 10*3/uL (ref 0.0–0.7)
Eosinophil %: 0.4 %
HCT: 29.7 % — ABNORMAL LOW (ref 35.0–47.0)
HGB: 9.6 g/dL — AB (ref 12.0–16.0)
LYMPHS PCT: 32.2 %
Lymphocyte #: 2 10*3/uL (ref 1.0–3.6)
MCH: 29.3 pg (ref 26.0–34.0)
MCHC: 32.3 g/dL (ref 32.0–36.0)
MCV: 90 fL (ref 80–100)
Monocyte #: 0.6 x10 3/mm (ref 0.2–0.9)
Monocyte %: 9.4 %
NEUTROS ABS: 3.5 10*3/uL (ref 1.4–6.5)
Neutrophil %: 57.3 %
Platelet: 230 10*3/uL (ref 150–440)
RBC: 3.29 10*6/uL — ABNORMAL LOW (ref 3.80–5.20)
RDW: 17.8 % — ABNORMAL HIGH (ref 11.5–14.5)
WBC: 6.1 10*3/uL (ref 3.6–11.0)

## 2014-04-22 LAB — TROPONIN I

## 2014-05-11 ENCOUNTER — Encounter (INDEPENDENT_AMBULATORY_CARE_PROVIDER_SITE_OTHER): Payer: Medicare Other | Admitting: Ophthalmology

## 2014-05-11 DIAGNOSIS — E11311 Type 2 diabetes mellitus with unspecified diabetic retinopathy with macular edema: Secondary | ICD-10-CM

## 2014-05-11 DIAGNOSIS — H2513 Age-related nuclear cataract, bilateral: Secondary | ICD-10-CM

## 2014-05-11 DIAGNOSIS — I10 Essential (primary) hypertension: Secondary | ICD-10-CM | POA: Diagnosis not present

## 2014-05-11 DIAGNOSIS — H34812 Central retinal vein occlusion, left eye: Secondary | ICD-10-CM

## 2014-05-11 DIAGNOSIS — E11329 Type 2 diabetes mellitus with mild nonproliferative diabetic retinopathy without macular edema: Secondary | ICD-10-CM

## 2014-05-11 DIAGNOSIS — H35033 Hypertensive retinopathy, bilateral: Secondary | ICD-10-CM | POA: Diagnosis not present

## 2014-05-11 DIAGNOSIS — E11331 Type 2 diabetes mellitus with moderate nonproliferative diabetic retinopathy with macular edema: Secondary | ICD-10-CM

## 2014-05-11 DIAGNOSIS — H43813 Vitreous degeneration, bilateral: Secondary | ICD-10-CM

## 2014-06-08 ENCOUNTER — Encounter (INDEPENDENT_AMBULATORY_CARE_PROVIDER_SITE_OTHER): Payer: Medicare Other | Admitting: Ophthalmology

## 2014-06-08 DIAGNOSIS — I10 Essential (primary) hypertension: Secondary | ICD-10-CM

## 2014-06-08 DIAGNOSIS — H35033 Hypertensive retinopathy, bilateral: Secondary | ICD-10-CM

## 2014-06-08 DIAGNOSIS — H34811 Central retinal vein occlusion, right eye: Secondary | ICD-10-CM

## 2014-06-08 DIAGNOSIS — H43813 Vitreous degeneration, bilateral: Secondary | ICD-10-CM

## 2014-07-06 ENCOUNTER — Encounter (INDEPENDENT_AMBULATORY_CARE_PROVIDER_SITE_OTHER): Payer: Medicare Other | Admitting: Ophthalmology

## 2014-07-06 DIAGNOSIS — H2513 Age-related nuclear cataract, bilateral: Secondary | ICD-10-CM

## 2014-07-06 DIAGNOSIS — H34811 Central retinal vein occlusion, right eye: Secondary | ICD-10-CM

## 2014-07-06 DIAGNOSIS — E11329 Type 2 diabetes mellitus with mild nonproliferative diabetic retinopathy without macular edema: Secondary | ICD-10-CM

## 2014-07-06 DIAGNOSIS — H35033 Hypertensive retinopathy, bilateral: Secondary | ICD-10-CM

## 2014-07-06 DIAGNOSIS — E11319 Type 2 diabetes mellitus with unspecified diabetic retinopathy without macular edema: Secondary | ICD-10-CM

## 2014-07-06 DIAGNOSIS — E11339 Type 2 diabetes mellitus with moderate nonproliferative diabetic retinopathy without macular edema: Secondary | ICD-10-CM

## 2014-07-06 DIAGNOSIS — I10 Essential (primary) hypertension: Secondary | ICD-10-CM

## 2014-07-06 DIAGNOSIS — H43813 Vitreous degeneration, bilateral: Secondary | ICD-10-CM

## 2014-08-07 DIAGNOSIS — E0501 Thyrotoxicosis with diffuse goiter with thyrotoxic crisis or storm: Secondary | ICD-10-CM | POA: Diagnosis not present

## 2014-08-10 ENCOUNTER — Encounter (INDEPENDENT_AMBULATORY_CARE_PROVIDER_SITE_OTHER): Payer: Medicare Other | Admitting: Ophthalmology

## 2014-08-11 DIAGNOSIS — K5781 Diverticulitis of intestine, part unspecified, with perforation and abscess with bleeding: Secondary | ICD-10-CM | POA: Diagnosis not present

## 2014-08-11 DIAGNOSIS — E118 Type 2 diabetes mellitus with unspecified complications: Secondary | ICD-10-CM | POA: Diagnosis not present

## 2014-08-11 DIAGNOSIS — E0501 Thyrotoxicosis with diffuse goiter with thyrotoxic crisis or storm: Secondary | ICD-10-CM | POA: Diagnosis not present

## 2014-08-11 DIAGNOSIS — I10 Essential (primary) hypertension: Secondary | ICD-10-CM | POA: Diagnosis not present

## 2014-08-11 DIAGNOSIS — E780 Pure hypercholesterolemia: Secondary | ICD-10-CM | POA: Diagnosis not present

## 2014-08-15 ENCOUNTER — Encounter (INDEPENDENT_AMBULATORY_CARE_PROVIDER_SITE_OTHER): Payer: Medicare Other | Admitting: Ophthalmology

## 2014-08-15 DIAGNOSIS — H34813 Central retinal vein occlusion, bilateral: Secondary | ICD-10-CM

## 2014-08-15 DIAGNOSIS — E11319 Type 2 diabetes mellitus with unspecified diabetic retinopathy without macular edema: Secondary | ICD-10-CM

## 2014-08-15 DIAGNOSIS — H34811 Central retinal vein occlusion, right eye: Secondary | ICD-10-CM | POA: Diagnosis not present

## 2014-08-15 DIAGNOSIS — H35033 Hypertensive retinopathy, bilateral: Secondary | ICD-10-CM | POA: Diagnosis not present

## 2014-08-15 DIAGNOSIS — I10 Essential (primary) hypertension: Secondary | ICD-10-CM

## 2014-08-15 DIAGNOSIS — E11329 Type 2 diabetes mellitus with mild nonproliferative diabetic retinopathy without macular edema: Secondary | ICD-10-CM

## 2014-08-15 DIAGNOSIS — H2513 Age-related nuclear cataract, bilateral: Secondary | ICD-10-CM

## 2014-08-15 DIAGNOSIS — E11339 Type 2 diabetes mellitus with moderate nonproliferative diabetic retinopathy without macular edema: Secondary | ICD-10-CM

## 2014-09-12 ENCOUNTER — Encounter (INDEPENDENT_AMBULATORY_CARE_PROVIDER_SITE_OTHER): Payer: Medicare Other | Admitting: Ophthalmology

## 2014-09-12 DIAGNOSIS — E11329 Type 2 diabetes mellitus with mild nonproliferative diabetic retinopathy without macular edema: Secondary | ICD-10-CM

## 2014-09-12 DIAGNOSIS — E11339 Type 2 diabetes mellitus with moderate nonproliferative diabetic retinopathy without macular edema: Secondary | ICD-10-CM

## 2014-09-12 DIAGNOSIS — E11319 Type 2 diabetes mellitus with unspecified diabetic retinopathy without macular edema: Secondary | ICD-10-CM

## 2014-09-12 DIAGNOSIS — H34811 Central retinal vein occlusion, right eye: Secondary | ICD-10-CM

## 2014-09-12 DIAGNOSIS — I10 Essential (primary) hypertension: Secondary | ICD-10-CM

## 2014-09-12 DIAGNOSIS — H35033 Hypertensive retinopathy, bilateral: Secondary | ICD-10-CM | POA: Diagnosis not present

## 2014-09-12 DIAGNOSIS — H43813 Vitreous degeneration, bilateral: Secondary | ICD-10-CM

## 2014-09-12 DIAGNOSIS — H2513 Age-related nuclear cataract, bilateral: Secondary | ICD-10-CM

## 2014-10-04 DIAGNOSIS — J45991 Cough variant asthma: Secondary | ICD-10-CM | POA: Diagnosis not present

## 2014-10-04 DIAGNOSIS — H6123 Impacted cerumen, bilateral: Secondary | ICD-10-CM | POA: Diagnosis not present

## 2014-10-04 DIAGNOSIS — J329 Chronic sinusitis, unspecified: Secondary | ICD-10-CM | POA: Diagnosis not present

## 2014-10-09 DIAGNOSIS — E0501 Thyrotoxicosis with diffuse goiter with thyrotoxic crisis or storm: Secondary | ICD-10-CM | POA: Diagnosis not present

## 2014-10-09 DIAGNOSIS — K5781 Diverticulitis of intestine, part unspecified, with perforation and abscess with bleeding: Secondary | ICD-10-CM | POA: Diagnosis not present

## 2014-10-10 ENCOUNTER — Encounter (INDEPENDENT_AMBULATORY_CARE_PROVIDER_SITE_OTHER): Payer: Medicare Other | Admitting: Ophthalmology

## 2014-10-10 DIAGNOSIS — E11339 Type 2 diabetes mellitus with moderate nonproliferative diabetic retinopathy without macular edema: Secondary | ICD-10-CM | POA: Diagnosis not present

## 2014-10-10 DIAGNOSIS — I10 Essential (primary) hypertension: Secondary | ICD-10-CM | POA: Diagnosis not present

## 2014-10-10 DIAGNOSIS — E11329 Type 2 diabetes mellitus with mild nonproliferative diabetic retinopathy without macular edema: Secondary | ICD-10-CM

## 2014-10-10 DIAGNOSIS — H43813 Vitreous degeneration, bilateral: Secondary | ICD-10-CM | POA: Diagnosis not present

## 2014-10-10 DIAGNOSIS — H35033 Hypertensive retinopathy, bilateral: Secondary | ICD-10-CM

## 2014-10-10 DIAGNOSIS — H2513 Age-related nuclear cataract, bilateral: Secondary | ICD-10-CM | POA: Diagnosis not present

## 2014-10-10 DIAGNOSIS — H34811 Central retinal vein occlusion, right eye: Secondary | ICD-10-CM | POA: Diagnosis not present

## 2014-10-10 DIAGNOSIS — E11319 Type 2 diabetes mellitus with unspecified diabetic retinopathy without macular edema: Secondary | ICD-10-CM | POA: Diagnosis not present

## 2014-10-12 DIAGNOSIS — E780 Pure hypercholesterolemia: Secondary | ICD-10-CM | POA: Diagnosis not present

## 2014-10-12 DIAGNOSIS — E0501 Thyrotoxicosis with diffuse goiter with thyrotoxic crisis or storm: Secondary | ICD-10-CM | POA: Diagnosis not present

## 2014-10-12 DIAGNOSIS — H6123 Impacted cerumen, bilateral: Secondary | ICD-10-CM | POA: Diagnosis not present

## 2014-10-12 DIAGNOSIS — E118 Type 2 diabetes mellitus with unspecified complications: Secondary | ICD-10-CM | POA: Diagnosis not present

## 2014-10-12 DIAGNOSIS — I10 Essential (primary) hypertension: Secondary | ICD-10-CM | POA: Diagnosis not present

## 2014-11-21 ENCOUNTER — Encounter (INDEPENDENT_AMBULATORY_CARE_PROVIDER_SITE_OTHER): Payer: Medicare Other | Admitting: Ophthalmology

## 2014-11-21 DIAGNOSIS — E11319 Type 2 diabetes mellitus with unspecified diabetic retinopathy without macular edema: Secondary | ICD-10-CM | POA: Diagnosis not present

## 2014-11-21 DIAGNOSIS — I10 Essential (primary) hypertension: Secondary | ICD-10-CM | POA: Diagnosis not present

## 2014-11-21 DIAGNOSIS — H43813 Vitreous degeneration, bilateral: Secondary | ICD-10-CM

## 2014-11-21 DIAGNOSIS — H34811 Central retinal vein occlusion, right eye: Secondary | ICD-10-CM | POA: Diagnosis not present

## 2014-11-21 DIAGNOSIS — E11339 Type 2 diabetes mellitus with moderate nonproliferative diabetic retinopathy without macular edema: Secondary | ICD-10-CM | POA: Diagnosis not present

## 2014-11-21 DIAGNOSIS — H35033 Hypertensive retinopathy, bilateral: Secondary | ICD-10-CM

## 2014-11-21 DIAGNOSIS — E11329 Type 2 diabetes mellitus with mild nonproliferative diabetic retinopathy without macular edema: Secondary | ICD-10-CM

## 2014-11-25 NOTE — H&P (Signed)
PATIENT NAME:  Taylor Tyler, Taylor Tyler MR#:  [id number] DATE OF BIRTH:  1941-02-12  DATE OF ADMISSION:  04/21/2014  PRIMARY CARE PHYSICIAN: Dr. Onalee Hua.   REFERRING PHYSICIAN: Dr. Delman Kitten.    CHIEF COMPLAINT: Right frontal side headache and uncontrolled blood pressure.   HISTORY OF PRESENT ILLNESS:  Ms. Stathis is a 74 year old female with a past medical history of hypertension, diabetes mellitus has been experiencing scotomata in front of the eye for the last 1 to 2 weeks. Concerning this she went to her regular ophthalmologist who referred her to a specialist.  The patient was seen by retinal specialist and was told that the patient had 2 hemorrhages.  The patient was given eyedrops.  The patient states improved vision since yesterday.  The patient states that since this afternoon she started to experience pain in the right frontal area with no worsening of the vision from the baseline. Concerning this came to the Emergency Department and workup in the Emergency Department, CT head without contrast no acute intracranial abnormality.  Complete metabolic profile and CBC are completely within normal limits.     PAST MEDICAL HISTORY:  1. Hypertension.  2. Hyperlipidemia. 3. Diabetes mellitus.   PAST SURGICAL HISTORY:  1. Breast reduction. 2. Cholecystectomy. 3. Hysterectomy. .   ALLERGIES: HYDRALAZINE.   HOME MEDICATIONS:  1. Simvastatin 40 mg daily.  2. Methimazole 10 mg daily. 3. Klor-Con 20 mEq daily. 4. Lasix 20 mg daily. 5. Ferrous sulfate 325 mg daily. 6. Centrum Silver multivitamin daily. 7. Coreg 12.5 mg 2 times a day. 8. Calcium with vitamin D daily.   SOCIAL HISTORY: No history of smoking, drinking alcohol or using illicit drugs. Lives by herself.     FAMILY HISTORY: Positive for hypertension and diabetes mellitus.   REVIEW OF SYSTEMS:  CONSTITUTIONAL: Experiencing generalized weakness. EYES: Has some change in vision in the last two weeks, but no worsening from  yesterday.  EARS, NOSE AND THROAT: No change in hearing.  RESPIRATORY: No cough or shortness of breath. CARDIOVASCULAR: No chest pain, palpitations.   GASTROINTESTINAL: No nausea, vomiting or abdominal pain.  GENITOURINARY: No dysuria or hematuria.  HEMATOLOGIC: No easy bruising or bleeding.   SKIN: No rash or lesions.  MUSCULOSKELETAL: No joint pains. NEUROLOGIC: No weakness or numbness in any part of the body.    PHYSICAL EXAMINATION:  GENERAL: Well-developed, well-nourished, age appropriate female laying down in the bed in no distress.   VITAL SIGNS: Temperature is 98, pulse 42, blood pressure 146/69, respiratory rate 14, oxygen saturation 98% on room air.  HEENT: Head normocephalic, atraumatic.  No scleral icterus.  Conjunctivae normal.  Pupils equal, round and reactive to light.  Mucous membranes moist.  No pharyngeal edema.  NECK: No lymphadenopathy.  No JVD. No carotid bruit.   CHEST: Has no focal tenderness.   LUNGS: Bilaterally clear to auscultation.  HEART:  S1 and S2.  No murmurs are heard.  ABDOMEN: Bowel sounds present.  Soft.  Nontender, nondistended.  EXTREMITIES: No pedal edema.  Pulses 2+. NEUROLOGIC: The patient is alert and oriented to place, person and time.  Cranial nerves II-XII are intact.  Motor is 5/5 in upper and lower extremities.    LABORATORIES: BMP is completely within normal limits.  CBC is completely within normal limits.  Sed rate is 39.  CT of head without contrast, no acute intracranial abnormality.  Troponin less than 0.02.    ASSESSMENT AND PLAN: 1. Right frontal headache.  Could be from the hypertension.  CT head without contrast does not show any acute intracranial abnormality.  Does not have any new neurological deficits.  Does not have any worsening of the headache.  Continue to follow-up controlling the blood pressure.   2. Retinal hemorrhages.  The patient will be followed up by ophthalmology.   3. Diabetes mellitus on metformin.   4.  Hypertension poorly controlled.  The patient is on Coreg 12.5 mg b.i.d. Discontinue the Coreg as the patient is having low heart rate in the 40s.  Start the patient on Norvasc and lisinopril considering the patient's history of diabetes mellitus and follow-up with a blood pressure.  The patient states has been having the blood pressure over 200 frequently, especially worse in 3 to 4 weeks.   5. Hypothyroidism.  Continue the methimazole.   6. Keep the patient on deep venous thrombosis prophylaxis with Lovenox.    TIME SPENT:  55 minutes.    ____________________________ Monica Becton, MD pv:JT D: 04/21/2014 21:49:50 ET T: 04/21/2014 23:09:57 ET JOB#: [id number]  cc: Monica Becton, MD, <Dictator> Monica Becton MD ELECTRONICALLY SIGNED 05/03/2014 21:21

## 2014-11-25 NOTE — Discharge Summary (Signed)
PATIENT NAME:  Taylor Tyler, Taylor Tyler MR#:  [id number] DATE OF BIRTH:  [birth year]  DATE OF ADMISSION:  01/19/2014 DATE OF DISCHARGE:  01/31/2014  PRINCIPAL DIAGNOSIS: Contained perforation of descending/sigmoid colon junction, diverticular.   OTHER DIAGNOSES: 1.  Ventral hernia.  2.  Postoperative delirium.  3.  Uncontrolled Graves disease.  4.  Hypertension.  5.  Type 2 diabetes mellitus.  6.  Obesity.  7.  Dyslipidemia.   PRINCIPAL PROCEDURE PERFORMED DURING THIS ADMISSION: On 01/26/2014, distal descending and sigmoid colectomy with splenic flexure mobilization and ventral hernia repair.   HOSPITAL COURSE: The patient was admitted to the hospital and given IV antibiotics and it was recognized that she had uncontrolled hyperthyroidism and therefore she was placed on methimazole and beta blockers and she remained n.p.o. or on a clear liquid diet and was given lactulose in addition to that to maintain her bowel prep. Her methimazole and beta blockers were increased appropriately, and she underwent a full mechanical and antibiotic bowel prep and then the above-mentioned procedure for the above-mentioned diagnosis. Postoperatively, she developed some postoperative delirium that was controlled by discontinuing all of her psychotropic medications as well as her IV opioid analgesics, and she then continued to improve until the day of discharge when she was tolerating a diet, and her wound was clean and her abdomen was soft and nontender. She was discharged home on methimazole 30 mg daily and propranolol 20 mg q. 6 hours in addition to her prehospital medication regimen, and she was asked to make an appointment to see me in 1 to 2 weeks for surgical follow-up and to call in the interim for any problems.  ____________________________ Consuela Mimes, MD wfm:sb D: 02/08/2014 19:43:49 ET T: 02/09/2014 07:56:34 ET JOB#: [id number]  cc: Consuela Mimes, MD, <Dictator> A. Lavone Orn, MD Lenard Simmer, MD Consuela Mimes MD ELECTRONICALLY SIGNED 02/17/2014 21:40

## 2014-11-25 NOTE — Op Note (Signed)
PATIENT NAME:  Taylor Tyler, Taylor Tyler MR#:  [id number] DATE OF BIRTH:  [birth year]  DATE OF PROCEDURE:  01/26/2014  PREOPERATIVE DIAGNOSES: Descending/sigmoid colon junction perforation, ventral hernia.   POSTOPERATIVE DIAGNOSES: Descending/sigmoid colon junction perforation, ventral hernia.   SURGEON: Consuela Mimes, M.D.   ANESTHESIA: General.   OPERATION PERFORMED:  1. Distal descending/sigmoid colectomy.  2. Ventral hernia repair.  3. Splenic flexure mobilization.   PROCEDURE IN DETAIL: The patient was placed supine on the operating room table and prepped and draped in the usual sterile fashion. An incision was made in the previous lower midline and this was carried superiorly above the umbilicus and through the extensive subcutaneous tissue down to the linea alba and hernia sac. The hernia sac was in the dependent portion of the incision and the hernia sac went well into a large panniculus. The hernia sac was completely dissected out from surrounding scar tissue and subcutaneous tissue and was excised and sent as a separate specimen. The peritoneum was entered in doing so very carefully and the omentum, which was adherent to the anterior abdominal wall, was dissected away from the anterior abdominal wall with the electrocautery. These were essentially all of the adhesions as there were no adhesions within the main peritoneal cavity. Once the omentum had been dissected off, the area of inflammation was obvious as it was right at the descending colon/sigmoid colon junction and the perforation appeared to have occurred laterally where the white line of Toldt would have been. The distal two-thirds of omentum was dissected off of the mid and distal transverse colon and left intact with an intact blood supply and then a splenic flexure mobilization was undertaken by taking the splenic flexure down and mobilizing the descending colon off of Gerota's fascia. The white line of Toldt was taken down for the  sigmoid colon coming up to the area of the inflammation as well and then a small portion of abdominal wall was resected with the colon as it was grossly inflamed. The ureter was identified distally and traced proximally and found to be a considerable distance away from the area of inflammation and abscess cavity. The abscess cavity was entered and it drained approximately 10 mL of creamy white pus that did not smell badly. As the entire splenic flexure was mobilized and area of perforation also was mobilized, it was apparent that I could leave the middle colic artery and vein intact and still have the mid-descending colon reach the pelvis. I then turned my attention to the pelvis where I dissected an adhesion from the distal sigmoid colon to the area of the previous hysterectomy and the pelvis that had created a kink or twist in the distal sigmoid colon right at the rectosigmoid junction. After I lysed this very dense adhesion, I was able to deliver about 6 more inches of distal sigmoid colon into the peritoneal cavity. I elected to leave about 1 or 2 cm of proximal rectum intact and reset with a staple line distally right at the rectosigmoid junction. This was done with a Contour stapling device and the mesorectum was divided above the descending branch of the inferior mesenteric artery such that the rectum had an excellent blood supply. Similarly, the colon between the mid and distal descending colon was divided with the GIA stapling device and the mesocolon was divided with the Harmonic scalpel with the exception of a few large branches that were coming off of the descending branch of the inferior mesenteric artery. These were ligated with  2-0 silk ligatures. Ultimately, the specimen was removed and the staple line in the descending colon was excised with the electrocautery and EEA sizers were used and a 25 mm EEA anvil was introduced through the colotomy and brought out through a teniae coli on the  anti-mesenteric side of the colon and then the colotomy was reclosed with another firing of the GIA stapling device. The EEA stapler was introduced transanally after dilating the anus and rectum and connected to the anvil, closed and fired creating an EEA anastomosis in a [employer] fashion. The EEA donuts were inspected and both seen to be completely intact with all layers of the colon and rectal walls respectively and then a rigid proctosigmoidoscopy was undertaken with the pelvis under warm sterile saline and the proximal colon manually clamped. With insufflation, there was no air leakage and the anastomosis appeared completely patent and straight and was non-bleeding; it was located 18 cm proximal to the anal verge. The peritoneum was irrigated with 3 liters of warm normal saline prior to the anastomosis (because of the small amount of pus) and then was irrigated with an additional 2 liters of warm normal saline after the anastomosis. The small intestine was replaced in its anatomic position and the omentum was draped over top of the small intestine such that it reached all the way into the pelvis to the anastomosis and the linea alba was closed with a running #1 PDS suture. Inferiorly where the fairly wide hernia defect was located, I performed a mass closure-type technique with the PDS and additionally placed 2 internal #2 Prolene retention sutures in order to bring the rectus abdominis muscles together without tension. The subcutaneous tissue was irrigated with copious amounts of warm normal saline again with a serial dilution-type technique and the skin was reapproximated with a skin stapling device and a sterile dressing was applied. After an abdominal binder was placed, the procedure was concluded. There were no complications.    ____________________________ Consuela Mimes, MD wfm:es D: 01/26/2014 14:27:06 ET T: 01/26/2014 15:30:52 ET JOB#: [id number]  cc: Consuela Mimes, MD,  <Dictator> Consuela Mimes MD ELECTRONICALLY SIGNED 01/27/2014 9:38

## 2014-11-25 NOTE — Consult Note (Signed)
Chief Complaint and History:  Referring Physician Dr Anselm Jungling   Allergies:  Hydralazine: Angioedema  CLAMS: Rash  Assessment/Plan:  Assessment/Plan 74 yo F admitted with abdominal pain found to have SBO, concern for perforated colon, diverticulosis and uncontrolled hyperthyroidism. She has been started on methiazole. Labs reviewed. From admission, free T4 has improved. Potentially to go for surgery on Thursday.  A/ Uncontrolled hyperthyroidism, likely due to Grave's Disease  P/ Will recheck a free T4 and consider increasing methimazole. Avoid iodinated contrast dye if possible, as this may worsen hyperthyroidism. Will follow along with you. Full consult will be dictated.   Electronic Signatures: Judi Cong (MD)  (Signed 22-Jun-15 20:49)  Authored: Chief Complaint and History, ALLERGIES, HOME MEDICATIONS, Assessment/Plan   Last Updated: 22-Jun-15 20:49 by Judi Cong (MD)

## 2014-11-25 NOTE — Discharge Summary (Signed)
PATIENT NAME:  Taylor Tyler, Taylor Tyler MR#:  [id number] DATE OF BIRTH:  [birth year]  DATE OF ADMISSION:  12/14/2013  DATE OF DISCHARGE:  12/18/2013  ADMISSION DIAGNOSES:  1.  Sepsis secondary to diverticulitis and urinary tract infection.  2.  E. coli urinary tract infection.  3.  Hypertension.  4.  Allergic reaction to HYDRALAZINE.  5.  Acute on chronic anemia.   CONSULTATIONS: None.   STUDIES:  Thyroid ultrasound showed a heterogeneous gland, without focal nodule. CT scan on admission showed diverticulitis.   PERTINENT LABORATORIES:  Discharge sodium 138, potassium 3.1, chloride 109, bicarb 24, BUN 24, creatinine 0.97, glucose 120. White blood cells 11.9, hemoglobin 8.4, hematocrit 27, platelets are 220.  HOSPITAL COURSE:  This is a 74 year old female who presented with abdominal pain. Had a CT scan, which showed acute diverticulitis. For further details, please refer to the H and P.   1. Sepsis on arrival. The patient presented with fevers, tachycardia, leukocytosis. CT scan was positive for diverticulitis. She also had a urinary tract infection. No blood cultures were sent on admission. Her white blood cell count has improved. She has improved. She has been afebrile. She is tolerating a regular diet. She was on ceftriaxone and Flagyl.  2.  E. coli urinary tract infection. Her urine culture did come back positive for E. coli, which was pansensitive.   3.  Diabetes. The patient will resume her metformin.   4.  Hypertension. The patient had some IV hydralazine, which caused an allergic reaction. She was continued on her outpatient medications, and will need close followup, as her blood pressures were not ideally controlled in the hospital.  5.  Acute on chronic anemia, likely secondary to dilutional component as well as chronic anemia.   6. Neck swelling, likely allergic reaction to IV hydralazine. She was placed on steroids, Benadryl, famotidine. Other than the neck swelling, she had no other  symptoms such as difficulty swallowing or talking.  DISCHARGE MEDICATIONS:  1.  Simvastatin 40 mg at bedtime.  2.  Lasix 40 mg b.i.d.   3.  K-Dur 20 mEq b.i.d.  4.  Metformin 500 b.i.d.  5.  Calcium 600 Plus D daily.  6.  HCTZ/lisinopril 12.5/20 daily.  7.  Prednisone 50 mg x 3 days.  8.  Flagyl 5000 mg q. 8 hours x 10 days.  9.  Ciprofloxacin 500 mg q. 12 hours x10 days.  10.  Diphenhydramine 25 mg q. 4 hours p.r.n. allergic reaction.   DISCHARGE DIET: Low sodium.   DISCHARGE ACTIVITY: As tolerated.   DISCHARGE FOLLOW UP:  The patient will follow up with [facility] in one week.   The patient is medically stable for discharge.   TIME SPENT: 35 minutes.   ____________________________ Donell Beers. Benjie Karvonen, MD spm:mr D: 12/18/2013 11:46:33 ET T: 12/18/2013 18:05:24 ET JOB#: [id number]  cc: Milderd Manocchio P. Benjie Karvonen, MD, <Dictator> Lenard Simmer, MD  Donell Beers Jodi Kappes MD ELECTRONICALLY SIGNED 12/19/2013 12:29

## 2014-11-25 NOTE — Discharge Summary (Signed)
PATIENT NAME:  Taylor Tyler, Taylor Tyler MR#:  [id number] DATE OF BIRTH:  Dec 08, 1940  DATE OF ADMISSION:  04/22/2014 DATE OF DISCHARGE:  04/22/2014  For a detailed note, please take a look at the history and physical done on admission initially on admission by Dr. Lunette Stands.   DIAGNOSES AT DISCHARGE: As follows:  1. Malignant hypertension.  2. Sinus bradycardia.  3. Hyperlipidemia.  4. Hyperthyroidism.   DIET: The patient is being discharged on a low-sodium, low-fat diet.   ACTIVITY: As tolerated.   FOLLOWUP: With Dr. Ronnald Collum in the next 1 to 2 weeks.   DISCHARGE MEDICATIONS: Are as follows: Simvastatin 40 mg at bedtime, calcium with vitamin D 1 tablet daily, potassium 20 mEq daily, Lasix 20 mg daily, methimazole 10 mg daily, iron sulfate 325 mg daily, Centrum multivitamin daily, Besivance 0.6% ophthalmic suspension 1 drop to the right eye 4 times daily, losartan 40 mg daily, amlodipine 10 mg daily.   PERTINENT STUDIES: Done during the hospital course are as follows: A CT scan of the head done without contrast showing no acute intracranial process.   ASSESSMENT AND PLAN: This is a 74 year old female who presented to the hospital with a headache and also was noted to have significantly elevated blood pressures over the 200s.  1. Malignant hypertension. This was likely the cause of the patient's headache. The patient apparently was on Coreg for her blood pressure prior to coming in, but her heart rates were significantly low in the mid 40s to high 30s. She was therefore taken off the Coreg, started on some p.r.n. hydralazine and also started on some oral Norvasc and losartan. Her blood pressures since then have significantly improved. Presently, since she is clinically feeling better and her blood pressure is improved, she is being discharged on oral losartan and Norvasc. As stated, she has been taken off the Coreg due to her significant bradycardia.  2. Headache. This was likely secondary to the  significantly elevated blood pressures. Since her blood pressures have come down, her headache has now resolved. The CAT scan of her head on admission was essentially normal.  3. Hyperlipidemia. The patient was maintained on her simvastatin. She will resume that.  4. Hyperthyroidism. The patient was maintained on methimazole. She will resume that.  5. Bradycardia. This is probably related to the use of Coreg, which has now been suspended. She is clinically asymptomatic and not noted to be orthostatic. If she continues to be bradycardic and has hemodynamic instability, she possibly may need a pacemaker. This can be further evaluated through her primary care physician.   CODE STATUS: The patient is a full code.   DISPOSITION: She is being discharged home.   Time Spent: 40 minutes.     ____________________________ Belia Heman. Verdell Carmine, MD vjs:TT D: 04/22/2014 14:39:36 ET T: 04/22/2014 19:13:05 ET JOB#: [id number]  cc: Belia Heman. Verdell Carmine, MD, <Dictator> Lenard Simmer, MD Henreitta Leber MD ELECTRONICALLY SIGNED 04/24/2014 9:45

## 2014-11-25 NOTE — Consult Note (Signed)
PATIENT NAME:  Taylor Tyler, Taylor Tyler MR#:  [id number] DATE OF BIRTH:  01-28-1941  DATE OF CONSULTATION:  01/23/2014  REQUESTING PROVIDER:  Vaughan Basta, MD  DICTATING CONSULTANT: Lavone Orn, MD  PRIMARY CARE PHYSICIAN: Dr. Ronnald Collum.   CHIEF COMPLAINT: Hyperthyroidism.   HISTORY OF PRESENT ILLNESS: This is a 74 year old female seen in consultation for hyperthyroidism. She was hospitalized on 06/18 with abdominal pain and partial small bowel obstruction. On the day of admission, she had had labs as an outpatient with her primary care provider, which were abnormal and consistent with hyperthyroidism and elevated TPO antibodies. Per patient, she had been for treated for hyperthyroidism by Dr. Ronnald Collum around 2-3 years ago. She recalls taking methimazole for a time. She believes her thyroid levels became regulated and methimazole was able to be stopped. Over the last month, she has had a 27-pound weight loss. She has had several weeks of a tremor. She denies heart racing or palpitations. She has had dysphagia. This is improved today than it has been in as prior. She denies heat intolerance. She denies overt neck pain. Methimazole was restarted after a discussion I had with the hospitalist, about 3 days ago.  Regarding her abdominal pain, she has been found to have a contained perforation at the distal descending colon with sigmoid diverticulosis and a ventral hernia. She has been followed by surgery. She may tentatively undergo surgery this Thursday.   PAST MEDICAL HISTORY:  1. Hyperthyroidism.  2. Hypertension.  3. Diverticulosis.  4. Diabetes mellitus, type 2.  5. Hyperlipidemia.   PAST SURGICAL HISTORY:  1. Breast reduction.  2. Cholecystectomy.  3. Hysterectomy.    CURRENT MEDICATIONS:  1. Amlodipine 5 mg daily.  2. Lactulose 30 mg b.i.d.  3. Methimazole 30 mg daily.  4. Zosyn 3.375 grams IV q. 8 hours.  5. Propranolol 20 mg b.i.d.  6. Heparin 5000 units subcutaneous q. 8 hours.  7.  NovoLog insulin sliding scale.  8. Normal saline 0.9% at 50 mL/hour.   FAMILY HISTORY: The patient reports sister has some sort of thyroid disorder, details not clear. Also positive for hypertension and diabetes.   SOCIAL HISTORY: The patient denies alcohol and tobacco use.   ALLERGIES: HYDRALAZINE.   REVIEW OF SYSTEMS:  GENERAL: Weight loss as per HPI. No fever.  HEENT: Denies blurred vision. Denies sore throat.  NECK: Denies odynophagia. She has had dysphagia as per HPI. CARDIAC: Denies chest pain or palpitation.  PULMONARY: Denies cough or shortness of breath.  ABDOMEN: She has abdominal pain. She is tolerating a liquid diet.  EXTREMITIES: Denies lower extremity swelling. Denies myalgias.  ENDOCRINE: Denies heat or cold intolerance.  GENITOURINARY: Denies dysuria or hematuria.   PHYSICAL EXAMINATION:  VITAL SIGNS: Height 62.9 inches, weight 208 pounds. Temperature 97.6, pulse 70, respirations 17, blood pressure 160-198 over 71-77, O2 at  98% on room air.  GENERAL: Obese African American female in no acute distress.  HEENT: No proptosis or lid lag is appreciated.  OROPHARYNX: Clear. Mucous membranes moist.  NECK: Supple. Thyroid is diffusely enlarged with an irregular thyroid texture. No thyroid bruit.  ABDOMEN: Mild tenderness diffusely, no guarding.  CARDIAC: Regular rate and rhythm. No murmur.  PULMONARY: Clear to auscultation bilaterally. No wheeze.  EXTREMITIES: No peripheral edema.  NEUROLOGIC: Fine tremor of the outstretched hands is noted. No dysarthria.   LABORATORY DATA: On 01/19/2014 TSH less than 0.010. Free T4 elevated at 4.35.   On 01/22/2014 TSH less than 0.010. Free T4 elevated at 2.75. On 01/23/2014,  glucose 69, BUN 8, creatinine 0.8, potassium 3.4, calcium 9.3. WBC 5.1, hematocrit 27%, platelets 199,000. On 01/20/2014 AST 41, ALT 27.   ASSESSMENT: A 74 year old female with hyperthyroidism and history of elevated thyroid antibodies, consistent with uncontrolled  Graves disease.   RECOMMENDATIONS:  1. Methimazole was added to her regimen 3 days ago. I plan to repeat a free T4 and if it has not continued to trend down,then I will increase her methimazole. Will make an aggressive attempt to control hyperthyroidism before planned surgery later this week.  2. The patient is on propranolol, could titrate propranolol if needed while monitoring pulse to be a pulse of greater than 60.   Thank you for the kind request for a consultation. I will follow along with you.    ____________________________ A. Lavone Orn, MD ams:lt D: 01/23/2014 20:44:32 ET T: 01/23/2014 20:58:21 ET JOB#: [id number]  cc: A. Lavone Orn, MD, <Dictator> Sherlon Handing MD ELECTRONICALLY SIGNED 02/01/2014 17:30

## 2014-11-25 NOTE — H&P (Signed)
PATIENT NAME:  Taylor Tyler, DOTEN MR#:  [id number] DATE OF BIRTH:  1940-12-31  DATE OF ADMISSION:  01/19/2014  PRIMARY CARE PHYSICIAN: Dr. Ronnald Collum.   CHIEF COMPLAINT:  Abdominal pain, small bowel obstruction secondary to diverticulitis and hyperthyroidism.   HISTORY OF PRESENT ILLNESS:  This is a 74 year old female with history of hypertension, hyperlipidemia and diabetes, who was admitted in May for diverticulitis. She was treated with antibiotics. She had another episode of diverticulitis and was treated with antibiotics recently, and then due to ongoing pain, had a CT scan which did show perforation of the diverticula. She was seen by Dr. Ronnald Collum today and was sent to the ER for further evaluation and admission to the hospital. She had some thyroid labs there, which were completely abnormal. It looks like she has hyperthyroidism with a very low TSH and T4 that is elevated. Her TPO antibodies are also high at 300. Dr. Pat Patrick did not feel comfortable admitting the patient, so the hospitalist service was asked to admit the patient.  The patient received Tapazole, as well as IV propranolol in the ER. Her heart rate is less than 100 and she is not complaining of any chest pain.     REVIEW OF SYSTEMS:   CONSTITUTIONAL: No fever or fatigue.  EYES:  Has no blurred or double vision.  ENT: No ear pain, hearing loss, seasonal allergies, postnasal drip.  RESPIRATORY:  No cough, wheezing, hemoptysis or dyspnea.  CARDIOVASCULAR: No chest pain, orthopnea, edema, arrhythmias , palpitations or syncope. GASTROINTESTINAL:  Positive nausea. No vomiting. No diarrhea. Positive abdominal pain in left lower quadrant. No melena or ulcers.  GENITOURINARY:  No dysuria or hematuria. ENDOCRINE:  No polyuria or polydipsia. HEMATOLOGIC AND LYMPHATICS:   No bleeding or swollen glands.  SKIN:  No rash or lesions.  MUSCULOSKELETAL:  No redness or gout.  NEUROLOGIC: No history of CVA, TIA or  seizures.  PSYCHIATRIC:  No history of  anxiety or depression.   PAST MEDICAL HISTORY: 1.  Hypertension.  2.  Hyperlipidemia.  3.  Diabetes.   MEDICATIONS: 1.  Simvastatin 40 mg daily.  2.  Metformin 500 b.i.d.  3.  Lasix 40 mg b.i.d.  4.  Potassium 20 mEq b.i.d. b.i.d.  5.  Lisinopril/hydrochlorothiazide 1 tablet daily.  6.  Calcium 1 tablet daily.  7.  The patient was on a prednisone taper. 8.  Flagyl 500 mg q.8 hours. 9.  Ciprofloxacin 500 mg q.12 hours. 10.  Calcium 1 tablet daily.  ALLERGIES: CLAMS AND HYDRALAZINE.   SOCIAL HISTORY: No tobacco, alcohol or drug use.   FAMILY HISTORY:  Positive for hypertension and diabetes.   PAST SURGICAL HISTORY:  1.  Breast reduction.  2.  Cholecystectomy.  3.  Hysterectomy.   PHYSICAL EXAMINATION: VITAL SIGNS: Temperature 98.4, pulse 95, respirations 18, blood pressure 191/76, 100% on room air.  GENERAL: The patient is alert and oriented, not in acute distress.  HEENT:  Head is atraumatic. Pupils are round and reactive.  Sclerae anicteric. Mucous membranes are moist.   Oropharynx is clear. NECK: Supple without JVD, carotid bruit or enlarged thyroid.  CARDIOVASCULAR:  Regular rate and rhythm. No murmurs, gallops or rubs. PMI is not displaced.  LUNGS: Clear to auscultation without crackles, rales, rhonchi or wheezing. Normal to percussion.  ABDOMEN: She has got left lower quadrant tenderness. No rebound or guarding with hypoactive bowel sounds.  BACK: No CVA or vertebral tenderness. SKIN:  Without rash or lesions.  EXTREMITIES:  No clubbing, cyanosis or edema.  NEUROLOGIC: Cranial nerves II through XII are intact. No focal deficits.   LABORATORY DATA: Sodium 140, potassium 4.5, chloride 107, bicarb 25, BUN 26, creatinine 1.65, glucose 80, bilirubin 0.3, calcium 9.6, alk phos 45, ALT 32, AST 63, total protein 7.9, albumin 3.1. TSH is less than 0.010, T4 is 4.35. CT of the abdomen and pelvis showed diverticulitis in the left lower colon. Edema in the colon is slightly  progressed and there has been interval perforation posteriorly.   ASSESSMENT AND PLAN: A 74 year old female who has diverticula perforation and hyperthyroidism.  1.  Hyperthyroidism.  The patient received some Tapazole and propranolol here the ER. I will go ahead and consult endocrinology for further evaluation and management. Dr. Ronnald Collum does not come to the hospital, so I will go ahead and consult Dr. Gabriel Carina.  2.  Diverticulitis with perforation. Dr. Pat Patrick has been consulted. Further management per Dr. Pat Patrick.  3.  Accelerated hypertension partly due to pain. The patient will be on hydralazine p.r.n. as she is n.p.o.  4.  Hyperlipidemia. For now, we will hold medications.   The patient is a FULL CODE STATUS.     TIME SPENT: Approximately 45 minutes.      ____________________________ Donell Beers. Benjie Karvonen, MD spm:dmm D: 01/19/2014 18:30:00 ET T: 01/19/2014 19:20:59 ET JOB#: [id number]  cc: Sital P. Benjie Karvonen, MD, <Dictator> Lenard Simmer, MD Donell Beers MODY MD ELECTRONICALLY SIGNED 01/19/2014 20:17

## 2014-11-25 NOTE — Consult Note (Signed)
PATIENT NAME:  Taylor Tyler, Taylor Tyler MR#:  [id number] DATE OF BIRTH:  1940/09/01  DATE OF CONSULTATION:  01/20/2014  REFERRING PHYSICIAN:   CONSULTING PHYSICIAN:  Micheline Maze, MD  PRIMARY CARE PHYSICIAN: Dr. Hipolito Bayley.  ADMITTING PHYSICIAN: Internal Medicine.   CHIEF COMPLAINT: Abdominal pain and persistent diverticulitis.   HISTORY OF PRESENT ILLNESS:  Taylor Tyler is a 74 year old woman admitted through the Emergency Room with abdominal pain, nausea, vomiting, and weight loss. Her symptoms date back a month where she had an episode of left lower quadrant abdominal pain and mild distention suggestive of obstruction. She presented to the Emergency Room where a CT scan demonstrated what appeared to be some moderate diverticulitis in the left lower quadrant. She was treated with IV antibiotics and she did well initially, but has continued to have discomfort, nausea, anorexia, and weight loss over the last month. She noted some gastrointestinal bleeding recently. Because of her symptoms had not improved, she presented back to her primary care doctor Dr. Hipolito Bayley, who recommended follow-up CT scan. CT scan now demonstrated a pericolonic abscess, small amount of free air and fluids, and increasing diverticulitis without any row resolution with her antibiotics. He referred her to the Emergency Room for further evaluation and possible surgical intervention. She had a similar episode over 2 years ago which did not require hospitalization. She was treated with antibiotics and resolved. He had a colonoscopy 5 years ago, which did not demonstrate any other significant abnormalities, with the exception of some polyps and some known diverticulosis.   She has history of hypertension, diabetes, and thyroid disease. She has history of hyperthyroidism and was seen in the Emergency Room with symptom complex felt to be consistent with possible thyroid storm. Medical service admitted the patient for control of her other  medical problems.   MEDICATIONS: Well outlined in her chart. They include simvastatin 40 mg daily, metformin 500 mg b.i.d., Lasix 40 mg b.i.d., potassium 20 mEq b.i.d., lisinopril hydrochlorothiazide 1 tablet daily, Flagyl 500 mg every eight hours, ciprofloxacin 500 mg 12 hours, and calcium tablets daily.   ALLERGIES: SHE HAS ALLERGIES TO HYDRALAZINE.   SOCIAL HISTORY: She is not a cigarette smoker. Does not drink any alcohol.   FAMILY HISTORY: Noncontributory.  PAST SURGICAL HISTORY: Significant for breast reduction, appendectomy, hysterectomy, and cholecystectomy.   PHYSICAL EXAMINATION:  GENERAL: She is an alert and pleasant woman, at rest in bed comfortably.  She does appear a bit pale and washed out. VITAL SIGNS: Her heart rate is 90 and regular, blood pressure is 179/78, oxygen saturation is normal. She is afebrile. HEENT: No scleral icterus. No pupillary abnormalities. No facial deformities.  NECK: Supple and nontender with no adenopathy. Midline trachea.  CHEST: Clear with no adventitious sounds and she has normal pulmonary excursion.  CARDIAC: No murmurs, rubs, or gallops to my ear. She seems to be in normal sinus rhythm.  ABDOMEN: Her abdomen has multiple lower midline scars. General abdominal tenderness. No point tenderness. No masses. No rebound. No significant guarding.  EXTREMITIES: Lower extremity exam reveals full range of motion. No deformities.  PSYCHIATRIC: Normal orientation. Normal affect.   IMPRESSION: I have independently reviewed both CT scans. She does appear to have increasing problems with her diverticulitis, now with a pericolonic abscess. Her symptoms have  been persistent and with that ongoing problem and her worsening CT scans, we talked with her about surgical resection. Outlined the possibility of Hartmann's procedure with diverting colostomy or primary anastomosis with ileostomy diversion.  She is going to make a decision in the near future and get back with [place]  about the option, but I anticipate that she will choose surgical intervention.    ____________________________ Rodena Goldmann III, MD rle:ts D: 01/20/2014 13:42:48 ET T: 01/20/2014 14:18:59 ET JOB#: [id number]  cc: Micheline Maze, MD, <Dictator> Lenard Simmer, MD Rodena Goldmann MD ELECTRONICALLY SIGNED 01/20/2014 18:19

## 2014-11-25 NOTE — H&P (Signed)
PATIENT NAME:  Taylor Tyler, Taylor Tyler MR#:  [id number] DATE OF BIRTH:  1941-01-09  DATE OF ADMISSION:  12/14/2013  PRIMARY CARE PHYSICIAN: Dr. Belinda Fisher.   REFERRING PHYSICIAN: Dr. Delman Kitten.   CHIEF COMPLAINT: Left lower quadrant pain.   HISTORY OF PRESENT ILLNESS: Taylor Tyler is a 74 year old female with history of hypertension, hyperlipidemia, diabetes mellitus. Comes to the Emergency Department with complaints of left lower quadrant pain for the last 2 days. The pain has been gradually getting worse. Concerning this, came to the Emergency Department. Workup in the Emergency Department with CT abdomen and pelvis consistent with diverticulitis. The patient received Cipro and Flagyl in the Emergency Department. The patient is also found to have a urinary tract infection. The patient has been experiencing dysuria.    PAST MEDICAL HISTORY:  1. Hypertension.  2. Hyperlipidemia.  3. Diabetes mellitus, on oral medication.   ALLERGIES: CLAMS.    HOME MEDICATIONS:  1. Simvastatin 40 mg once a day.  2. Metformin 500 mg 2 times a day.  3. Lasix 40 mg 2 times a day.  4. Klor-Con 20 mEq 2 times a day.  5. Lisinopril/hydrochlorothiazide 1 tablet once a day.  6. Calcium with vitamin D once a day.   SOCIAL HISTORY: Denies smoking, drinking alcohol or using illicit drugs. Lives by herself.   FAMILY HISTORY: Hypertension, diabetes mellitus.   REVIEW OF SYSTEMS:  CONSTITUTIONAL: Experiencing generalized weakness.  EYES: No change in vision.  ENT: No change in hearing.  RESPIRATORY: No cough, shortness of breath.  CARDIOVASCULAR: No chest pain, palpations. GASTROINTESTINAL: Had 3 episodes of vomiting in the Emergency Department.  GENITOURINARY: No or hematuria.  ENDOCRINE: Has diagnosis of diabetes mellitus.  HEMATOLOGIC: No easy bruising or bleeding.  SKIN: No rash or lesions.  MUSCULOSKELETAL: No joint pains and aches.  NEUROLOGIC:  numbness in any part of the body.   PHYSICAL EXAMINATION:   GENERAL: This is a well-built, well-nourished, age-appropriate female lying down in the bed, not in distress.  VITAL SIGNS: Temperature 99, pulse 105, blood pressure , respiratory rate of 16, oxygen saturation 100% on room air.  HEENT: Head normocephalic, atraumatic. There is no scleral icterus. Conjunctivae normal. Pupils equal and react to light. Extraocular movements are intact. Mucous membranes moist. No pharyngeal erythema.  NECK: Supple. No lymphadenopathy. No JVD. No carotid bruit. No thyromegaly.  CHEST: Has no focal tenderness.  LUNGS: Bilaterally clear to auscultation.  HEART: S1, S2 regular. No murmurs are heard. No pedal edema. Pulses 2+.  ABDOMEN: Bowel sounds present. Soft. Has tenderness in the left lower quadrant. No rebound or guarding. No hepatosplenomegaly.  SKIN: No rash or lesions.  MUSCULOSKELETAL: Good range of motion in all of the extremities.  NEUROLOGIC: The patient is alert, oriented to place, person, and time. Cranial nerves II through XII intact. Motor 5/5 in upper and lower extremities.   LABORATORIES: CT abdomen and pelvis shows positive for diverticulitis of the distal descending colon without abscess or perforation.   CMP is completely within normal limits.   CBC: WBC of 13, hemoglobin 9.9, platelet count of 223.   UA 3+ leukocyte esterase, WBCs of 659 and 1+ bacteria.   ASSESSMENT AND PLAN: Taylor Tyler is a 74 year old female who comes to the Emergency Department with complains of left lower quadrant pain and found to have diverticulitis.  1. Diverticulitis: Keep the patient on Rocephin and Flagyl. The patient will need colonoscopy in 3 to 4 weeks.  2. Urinary tract infection: Will obtain urine culture.  Continue with Rocephin.  3. Diabetes mellitus: Will hold the metformin for now. Keep the patient on sliding scale insulin.  4. Hypertension: Hold the hydrochlorothiazide. Continue with lisinopril.  5. Keep the patient on deep vein thrombosis prophylaxis with  Lovenox.   TIME SPENT: 50 minutes.    ____________________________ Monica Becton, MD pv:gb D: 12/15/2013 01:59:54 ET T: 12/15/2013 03:19:15 ET JOB#: [id number]  cc: Monica Becton, MD, <Dictator> Lenard Simmer, MD Monica Becton MD ELECTRONICALLY SIGNED 12/30/2013 0:40

## 2014-12-27 DIAGNOSIS — I10 Essential (primary) hypertension: Secondary | ICD-10-CM | POA: Diagnosis not present

## 2014-12-27 DIAGNOSIS — Z6838 Body mass index (BMI) 38.0-38.9, adult: Secondary | ICD-10-CM | POA: Diagnosis not present

## 2014-12-27 DIAGNOSIS — M5416 Radiculopathy, lumbar region: Secondary | ICD-10-CM | POA: Diagnosis not present

## 2015-01-02 ENCOUNTER — Encounter (INDEPENDENT_AMBULATORY_CARE_PROVIDER_SITE_OTHER): Payer: Medicare Other | Admitting: Ophthalmology

## 2015-01-02 DIAGNOSIS — H34811 Central retinal vein occlusion, right eye: Secondary | ICD-10-CM

## 2015-01-02 DIAGNOSIS — E11329 Type 2 diabetes mellitus with mild nonproliferative diabetic retinopathy without macular edema: Secondary | ICD-10-CM | POA: Diagnosis not present

## 2015-01-02 DIAGNOSIS — H35033 Hypertensive retinopathy, bilateral: Secondary | ICD-10-CM

## 2015-01-02 DIAGNOSIS — E11319 Type 2 diabetes mellitus with unspecified diabetic retinopathy without macular edema: Secondary | ICD-10-CM | POA: Diagnosis not present

## 2015-01-02 DIAGNOSIS — I10 Essential (primary) hypertension: Secondary | ICD-10-CM | POA: Diagnosis not present

## 2015-01-02 DIAGNOSIS — H43813 Vitreous degeneration, bilateral: Secondary | ICD-10-CM

## 2015-01-02 DIAGNOSIS — H35372 Puckering of macula, left eye: Secondary | ICD-10-CM

## 2015-01-02 DIAGNOSIS — E11339 Type 2 diabetes mellitus with moderate nonproliferative diabetic retinopathy without macular edema: Secondary | ICD-10-CM

## 2015-02-13 ENCOUNTER — Encounter (INDEPENDENT_AMBULATORY_CARE_PROVIDER_SITE_OTHER): Payer: Medicare Other | Admitting: Ophthalmology

## 2015-02-13 DIAGNOSIS — I10 Essential (primary) hypertension: Secondary | ICD-10-CM

## 2015-02-13 DIAGNOSIS — H35372 Puckering of macula, left eye: Secondary | ICD-10-CM | POA: Diagnosis not present

## 2015-02-13 DIAGNOSIS — E11319 Type 2 diabetes mellitus with unspecified diabetic retinopathy without macular edema: Secondary | ICD-10-CM

## 2015-02-13 DIAGNOSIS — H43813 Vitreous degeneration, bilateral: Secondary | ICD-10-CM | POA: Diagnosis not present

## 2015-02-13 DIAGNOSIS — H35033 Hypertensive retinopathy, bilateral: Secondary | ICD-10-CM

## 2015-02-13 DIAGNOSIS — H34811 Central retinal vein occlusion, right eye: Secondary | ICD-10-CM | POA: Diagnosis not present

## 2015-02-13 DIAGNOSIS — E11339 Type 2 diabetes mellitus with moderate nonproliferative diabetic retinopathy without macular edema: Secondary | ICD-10-CM | POA: Diagnosis not present

## 2015-03-28 ENCOUNTER — Encounter (INDEPENDENT_AMBULATORY_CARE_PROVIDER_SITE_OTHER): Payer: Medicare Other | Admitting: Ophthalmology

## 2015-03-28 DIAGNOSIS — H35033 Hypertensive retinopathy, bilateral: Secondary | ICD-10-CM | POA: Diagnosis not present

## 2015-03-28 DIAGNOSIS — E11319 Type 2 diabetes mellitus with unspecified diabetic retinopathy without macular edema: Secondary | ICD-10-CM

## 2015-03-28 DIAGNOSIS — E11329 Type 2 diabetes mellitus with mild nonproliferative diabetic retinopathy without macular edema: Secondary | ICD-10-CM | POA: Diagnosis not present

## 2015-03-28 DIAGNOSIS — H34811 Central retinal vein occlusion, right eye: Secondary | ICD-10-CM | POA: Diagnosis not present

## 2015-03-28 DIAGNOSIS — H43813 Vitreous degeneration, bilateral: Secondary | ICD-10-CM | POA: Diagnosis not present

## 2015-03-28 DIAGNOSIS — I10 Essential (primary) hypertension: Secondary | ICD-10-CM

## 2015-05-09 ENCOUNTER — Encounter (INDEPENDENT_AMBULATORY_CARE_PROVIDER_SITE_OTHER): Payer: Medicare Other | Admitting: Ophthalmology

## 2015-05-09 DIAGNOSIS — H2513 Age-related nuclear cataract, bilateral: Secondary | ICD-10-CM

## 2015-05-09 DIAGNOSIS — H35372 Puckering of macula, left eye: Secondary | ICD-10-CM

## 2015-05-09 DIAGNOSIS — H35033 Hypertensive retinopathy, bilateral: Secondary | ICD-10-CM

## 2015-05-09 DIAGNOSIS — E11319 Type 2 diabetes mellitus with unspecified diabetic retinopathy without macular edema: Secondary | ICD-10-CM

## 2015-05-09 DIAGNOSIS — I10 Essential (primary) hypertension: Secondary | ICD-10-CM

## 2015-05-09 DIAGNOSIS — H43813 Vitreous degeneration, bilateral: Secondary | ICD-10-CM | POA: Diagnosis not present

## 2015-05-09 DIAGNOSIS — H34811 Central retinal vein occlusion, right eye, with macular edema: Secondary | ICD-10-CM

## 2015-05-09 DIAGNOSIS — E113393 Type 2 diabetes mellitus with moderate nonproliferative diabetic retinopathy without macular edema, bilateral: Secondary | ICD-10-CM | POA: Diagnosis not present

## 2015-05-14 LAB — HM DEXA SCAN: HM DEXA SCAN: ABNORMAL

## 2015-06-20 ENCOUNTER — Encounter (INDEPENDENT_AMBULATORY_CARE_PROVIDER_SITE_OTHER): Payer: Self-pay | Admitting: Ophthalmology

## 2015-06-21 ENCOUNTER — Encounter (INDEPENDENT_AMBULATORY_CARE_PROVIDER_SITE_OTHER): Payer: Self-pay | Admitting: Ophthalmology

## 2015-06-25 ENCOUNTER — Encounter (INDEPENDENT_AMBULATORY_CARE_PROVIDER_SITE_OTHER): Payer: Medicare Other | Admitting: Ophthalmology

## 2015-06-25 DIAGNOSIS — H34811 Central retinal vein occlusion, right eye, with macular edema: Secondary | ICD-10-CM

## 2015-06-25 DIAGNOSIS — I10 Essential (primary) hypertension: Secondary | ICD-10-CM

## 2015-06-25 DIAGNOSIS — H43813 Vitreous degeneration, bilateral: Secondary | ICD-10-CM | POA: Diagnosis not present

## 2015-06-25 DIAGNOSIS — E113392 Type 2 diabetes mellitus with moderate nonproliferative diabetic retinopathy without macular edema, left eye: Secondary | ICD-10-CM

## 2015-06-25 DIAGNOSIS — E11319 Type 2 diabetes mellitus with unspecified diabetic retinopathy without macular edema: Secondary | ICD-10-CM

## 2015-06-25 DIAGNOSIS — E113311 Type 2 diabetes mellitus with moderate nonproliferative diabetic retinopathy with macular edema, right eye: Secondary | ICD-10-CM

## 2015-06-25 DIAGNOSIS — H35033 Hypertensive retinopathy, bilateral: Secondary | ICD-10-CM | POA: Diagnosis not present

## 2015-07-23 ENCOUNTER — Encounter (INDEPENDENT_AMBULATORY_CARE_PROVIDER_SITE_OTHER): Payer: Medicare Other | Admitting: Ophthalmology

## 2015-07-23 DIAGNOSIS — I10 Essential (primary) hypertension: Secondary | ICD-10-CM | POA: Diagnosis not present

## 2015-07-23 DIAGNOSIS — E11311 Type 2 diabetes mellitus with unspecified diabetic retinopathy with macular edema: Secondary | ICD-10-CM | POA: Diagnosis not present

## 2015-07-23 DIAGNOSIS — H2513 Age-related nuclear cataract, bilateral: Secondary | ICD-10-CM

## 2015-07-23 DIAGNOSIS — E113392 Type 2 diabetes mellitus with moderate nonproliferative diabetic retinopathy without macular edema, left eye: Secondary | ICD-10-CM

## 2015-07-23 DIAGNOSIS — E113311 Type 2 diabetes mellitus with moderate nonproliferative diabetic retinopathy with macular edema, right eye: Secondary | ICD-10-CM

## 2015-07-23 DIAGNOSIS — H43813 Vitreous degeneration, bilateral: Secondary | ICD-10-CM | POA: Diagnosis not present

## 2015-07-23 DIAGNOSIS — H35033 Hypertensive retinopathy, bilateral: Secondary | ICD-10-CM

## 2015-07-23 DIAGNOSIS — H34811 Central retinal vein occlusion, right eye, with macular edema: Secondary | ICD-10-CM

## 2015-09-03 ENCOUNTER — Encounter (INDEPENDENT_AMBULATORY_CARE_PROVIDER_SITE_OTHER): Payer: Medicare Other | Admitting: Ophthalmology

## 2015-09-03 DIAGNOSIS — H35033 Hypertensive retinopathy, bilateral: Secondary | ICD-10-CM

## 2015-09-03 DIAGNOSIS — E11311 Type 2 diabetes mellitus with unspecified diabetic retinopathy with macular edema: Secondary | ICD-10-CM

## 2015-09-03 DIAGNOSIS — E113392 Type 2 diabetes mellitus with moderate nonproliferative diabetic retinopathy without macular edema, left eye: Secondary | ICD-10-CM

## 2015-09-03 DIAGNOSIS — I10 Essential (primary) hypertension: Secondary | ICD-10-CM | POA: Diagnosis not present

## 2015-09-03 DIAGNOSIS — E113311 Type 2 diabetes mellitus with moderate nonproliferative diabetic retinopathy with macular edema, right eye: Secondary | ICD-10-CM | POA: Diagnosis not present

## 2015-09-03 DIAGNOSIS — H2513 Age-related nuclear cataract, bilateral: Secondary | ICD-10-CM

## 2015-09-03 DIAGNOSIS — H34811 Central retinal vein occlusion, right eye, with macular edema: Secondary | ICD-10-CM | POA: Diagnosis not present

## 2015-09-03 DIAGNOSIS — H43813 Vitreous degeneration, bilateral: Secondary | ICD-10-CM

## 2015-10-08 ENCOUNTER — Encounter (INDEPENDENT_AMBULATORY_CARE_PROVIDER_SITE_OTHER): Payer: Medicare Other | Admitting: Ophthalmology

## 2015-10-12 ENCOUNTER — Encounter (INDEPENDENT_AMBULATORY_CARE_PROVIDER_SITE_OTHER): Payer: Medicare Other | Admitting: Ophthalmology

## 2015-10-19 ENCOUNTER — Encounter (INDEPENDENT_AMBULATORY_CARE_PROVIDER_SITE_OTHER): Payer: Medicare Other | Admitting: Ophthalmology

## 2015-10-19 DIAGNOSIS — I10 Essential (primary) hypertension: Secondary | ICD-10-CM | POA: Diagnosis not present

## 2015-10-19 DIAGNOSIS — H35033 Hypertensive retinopathy, bilateral: Secondary | ICD-10-CM

## 2015-10-19 DIAGNOSIS — H43813 Vitreous degeneration, bilateral: Secondary | ICD-10-CM

## 2015-10-19 DIAGNOSIS — E113392 Type 2 diabetes mellitus with moderate nonproliferative diabetic retinopathy without macular edema, left eye: Secondary | ICD-10-CM | POA: Diagnosis not present

## 2015-10-19 DIAGNOSIS — H34811 Central retinal vein occlusion, right eye, with macular edema: Secondary | ICD-10-CM

## 2015-10-19 DIAGNOSIS — E113311 Type 2 diabetes mellitus with moderate nonproliferative diabetic retinopathy with macular edema, right eye: Secondary | ICD-10-CM

## 2015-10-19 DIAGNOSIS — E11311 Type 2 diabetes mellitus with unspecified diabetic retinopathy with macular edema: Secondary | ICD-10-CM | POA: Diagnosis not present

## 2015-11-01 ENCOUNTER — Telehealth: Payer: Self-pay | Admitting: Gastroenterology

## 2015-11-01 NOTE — Telephone Encounter (Signed)
Patient has called and stated that she would like to have a colonoscopy. She states that it has been 10 years  Since her last one. Please call patient with a triage.

## 2015-11-06 NOTE — Telephone Encounter (Signed)
triage

## 2015-11-23 ENCOUNTER — Encounter (INDEPENDENT_AMBULATORY_CARE_PROVIDER_SITE_OTHER): Payer: Medicare Other | Admitting: Ophthalmology

## 2015-11-23 DIAGNOSIS — I10 Essential (primary) hypertension: Secondary | ICD-10-CM

## 2015-11-23 DIAGNOSIS — E11311 Type 2 diabetes mellitus with unspecified diabetic retinopathy with macular edema: Secondary | ICD-10-CM

## 2015-11-23 DIAGNOSIS — E113292 Type 2 diabetes mellitus with mild nonproliferative diabetic retinopathy without macular edema, left eye: Secondary | ICD-10-CM | POA: Diagnosis not present

## 2015-11-23 DIAGNOSIS — H2513 Age-related nuclear cataract, bilateral: Secondary | ICD-10-CM

## 2015-11-23 DIAGNOSIS — H43813 Vitreous degeneration, bilateral: Secondary | ICD-10-CM

## 2015-11-23 DIAGNOSIS — H35033 Hypertensive retinopathy, bilateral: Secondary | ICD-10-CM

## 2015-11-23 DIAGNOSIS — H34811 Central retinal vein occlusion, right eye, with macular edema: Secondary | ICD-10-CM | POA: Diagnosis not present

## 2015-11-23 DIAGNOSIS — E113311 Type 2 diabetes mellitus with moderate nonproliferative diabetic retinopathy with macular edema, right eye: Secondary | ICD-10-CM

## 2015-11-28 ENCOUNTER — Other Ambulatory Visit: Payer: Self-pay

## 2015-11-28 NOTE — Telephone Encounter (Signed)
Pt stated she was having abdominal pain on her right side but had a colonoscopy in 2015. Pt has not had a workup with her PCP. Advised her to contact them and schedule an appt. No nausea, vomiting, diarrhea or bloody stools. Will call me back if she needs to schedule colonoscopy or PCP will refer is they feel she needs GI.

## 2015-12-21 ENCOUNTER — Encounter (INDEPENDENT_AMBULATORY_CARE_PROVIDER_SITE_OTHER): Payer: Medicare Other | Admitting: Ophthalmology

## 2015-12-21 DIAGNOSIS — H2513 Age-related nuclear cataract, bilateral: Secondary | ICD-10-CM

## 2015-12-21 DIAGNOSIS — H34811 Central retinal vein occlusion, right eye, with macular edema: Secondary | ICD-10-CM

## 2015-12-21 DIAGNOSIS — H43813 Vitreous degeneration, bilateral: Secondary | ICD-10-CM

## 2015-12-21 DIAGNOSIS — E11311 Type 2 diabetes mellitus with unspecified diabetic retinopathy with macular edema: Secondary | ICD-10-CM

## 2015-12-21 DIAGNOSIS — H35033 Hypertensive retinopathy, bilateral: Secondary | ICD-10-CM

## 2015-12-21 DIAGNOSIS — E113392 Type 2 diabetes mellitus with moderate nonproliferative diabetic retinopathy without macular edema, left eye: Secondary | ICD-10-CM

## 2015-12-21 DIAGNOSIS — E113311 Type 2 diabetes mellitus with moderate nonproliferative diabetic retinopathy with macular edema, right eye: Secondary | ICD-10-CM

## 2015-12-21 DIAGNOSIS — I10 Essential (primary) hypertension: Secondary | ICD-10-CM

## 2016-02-01 ENCOUNTER — Encounter (INDEPENDENT_AMBULATORY_CARE_PROVIDER_SITE_OTHER): Payer: Medicare Other | Admitting: Ophthalmology

## 2016-02-01 DIAGNOSIS — H34811 Central retinal vein occlusion, right eye, with macular edema: Secondary | ICD-10-CM

## 2016-02-01 DIAGNOSIS — H35033 Hypertensive retinopathy, bilateral: Secondary | ICD-10-CM | POA: Diagnosis not present

## 2016-02-01 DIAGNOSIS — I10 Essential (primary) hypertension: Secondary | ICD-10-CM

## 2016-02-01 DIAGNOSIS — E113311 Type 2 diabetes mellitus with moderate nonproliferative diabetic retinopathy with macular edema, right eye: Secondary | ICD-10-CM | POA: Diagnosis not present

## 2016-02-01 DIAGNOSIS — H43813 Vitreous degeneration, bilateral: Secondary | ICD-10-CM | POA: Diagnosis not present

## 2016-02-01 DIAGNOSIS — E11311 Type 2 diabetes mellitus with unspecified diabetic retinopathy with macular edema: Secondary | ICD-10-CM | POA: Diagnosis not present

## 2016-02-01 DIAGNOSIS — E113392 Type 2 diabetes mellitus with moderate nonproliferative diabetic retinopathy without macular edema, left eye: Secondary | ICD-10-CM

## 2016-03-14 ENCOUNTER — Encounter (INDEPENDENT_AMBULATORY_CARE_PROVIDER_SITE_OTHER): Payer: Medicare Other | Admitting: Ophthalmology

## 2016-03-14 DIAGNOSIS — H43813 Vitreous degeneration, bilateral: Secondary | ICD-10-CM | POA: Diagnosis not present

## 2016-03-14 DIAGNOSIS — E113311 Type 2 diabetes mellitus with moderate nonproliferative diabetic retinopathy with macular edema, right eye: Secondary | ICD-10-CM | POA: Diagnosis not present

## 2016-03-14 DIAGNOSIS — E113392 Type 2 diabetes mellitus with moderate nonproliferative diabetic retinopathy without macular edema, left eye: Secondary | ICD-10-CM

## 2016-03-14 DIAGNOSIS — I10 Essential (primary) hypertension: Secondary | ICD-10-CM

## 2016-03-14 DIAGNOSIS — E11311 Type 2 diabetes mellitus with unspecified diabetic retinopathy with macular edema: Secondary | ICD-10-CM | POA: Diagnosis not present

## 2016-03-14 DIAGNOSIS — H34811 Central retinal vein occlusion, right eye, with macular edema: Secondary | ICD-10-CM

## 2016-03-14 DIAGNOSIS — H35033 Hypertensive retinopathy, bilateral: Secondary | ICD-10-CM

## 2016-04-08 ENCOUNTER — Encounter: Payer: Self-pay | Admitting: Medical Oncology

## 2016-04-08 ENCOUNTER — Emergency Department: Payer: Medicare Other

## 2016-04-08 ENCOUNTER — Emergency Department
Admission: EM | Admit: 2016-04-08 | Discharge: 2016-04-08 | Disposition: A | Discharge status: Home / Self Care | Payer: Medicare Other | Attending: Emergency Medicine | Admitting: Emergency Medicine

## 2016-04-08 DIAGNOSIS — Z79899 Other long term (current) drug therapy: Secondary | ICD-10-CM | POA: Insufficient documentation

## 2016-04-08 DIAGNOSIS — R1011 Right upper quadrant pain: Secondary | ICD-10-CM | POA: Insufficient documentation

## 2016-04-08 DIAGNOSIS — R1031 Right lower quadrant pain: Secondary | ICD-10-CM | POA: Diagnosis not present

## 2016-04-08 DIAGNOSIS — Z7984 Long term (current) use of oral hypoglycemic drugs: Secondary | ICD-10-CM | POA: Insufficient documentation

## 2016-04-08 DIAGNOSIS — I10 Essential (primary) hypertension: Secondary | ICD-10-CM | POA: Diagnosis not present

## 2016-04-08 DIAGNOSIS — Z791 Long term (current) use of non-steroidal anti-inflammatories (NSAID): Secondary | ICD-10-CM | POA: Diagnosis not present

## 2016-04-08 DIAGNOSIS — E119 Type 2 diabetes mellitus without complications: Secondary | ICD-10-CM | POA: Diagnosis not present

## 2016-04-08 DIAGNOSIS — R109 Unspecified abdominal pain: Secondary | ICD-10-CM

## 2016-04-08 LAB — URINALYSIS COMPLETE WITH MICROSCOPIC (ARMC ONLY)
BACTERIA UA: NONE SEEN
Bilirubin Urine: NEGATIVE
Glucose, UA: NEGATIVE mg/dL
Hgb urine dipstick: NEGATIVE
Ketones, ur: NEGATIVE mg/dL
Leukocytes, UA: NEGATIVE
Nitrite: NEGATIVE
PH: 5 (ref 5.0–8.0)
PROTEIN: 100 mg/dL — AB
SPECIFIC GRAVITY, URINE: 1.024 (ref 1.005–1.030)

## 2016-04-08 LAB — CBC
HCT: 35 % (ref 35.0–47.0)
HEMOGLOBIN: 11.7 g/dL — AB (ref 12.0–16.0)
MCH: 29.4 pg (ref 26.0–34.0)
MCHC: 33.4 g/dL (ref 32.0–36.0)
MCV: 88 fL (ref 80.0–100.0)
Platelets: 217 10*3/uL (ref 150–440)
RBC: 3.98 MIL/uL (ref 3.80–5.20)
RDW: 13.5 % (ref 11.5–14.5)
WBC: 5.5 10*3/uL (ref 3.6–11.0)

## 2016-04-08 LAB — COMPREHENSIVE METABOLIC PANEL
ALBUMIN: 3.3 g/dL — AB (ref 3.5–5.0)
ALK PHOS: 39 U/L (ref 38–126)
ALT: 18 U/L (ref 14–54)
AST: 25 U/L (ref 15–41)
Anion gap: 5 (ref 5–15)
BUN: 30 mg/dL — ABNORMAL HIGH (ref 6–20)
CALCIUM: 9.4 mg/dL (ref 8.9–10.3)
CO2: 24 mmol/L (ref 22–32)
CREATININE: 1.08 mg/dL — AB (ref 0.44–1.00)
Chloride: 110 mmol/L (ref 101–111)
GFR calc Af Amer: 58 mL/min — ABNORMAL LOW (ref >60)
GFR calc non Af Amer: 50 mL/min — ABNORMAL LOW (ref >60)
GLUCOSE: 99 mg/dL (ref 65–99)
Potassium: 4.7 mmol/L (ref 3.5–5.1)
SODIUM: 139 mmol/L (ref 135–145)
Total Bilirubin: <0.1 mg/dL — ABNORMAL LOW (ref 0.3–1.2)
Total Protein: 7.2 g/dL (ref 6.5–8.1)

## 2016-04-08 LAB — LIPASE, BLOOD: Lipase: 40 U/L (ref 11–51)

## 2016-04-08 MED ORDER — MORPHINE SULFATE (PF) 4 MG/ML IV SOLN
4.0000 mg | Freq: Once | INTRAVENOUS | Status: AC
  Administered 2016-04-08: 4 mg via INTRAVENOUS
  Filled 2016-04-08: qty 1

## 2016-04-08 MED ORDER — ONDANSETRON HCL 4 MG/2ML IJ SOLN
4.0000 mg | Freq: Once | INTRAMUSCULAR | Status: AC
  Administered 2016-04-08: 4 mg via INTRAVENOUS
  Filled 2016-04-08: qty 2

## 2016-04-08 MED ORDER — IOPAMIDOL (ISOVUE-300) INJECTION 61%
30.0000 mL | Freq: Once | INTRAVENOUS | Status: AC | PRN
  Administered 2016-04-08: 30 mL via ORAL

## 2016-04-08 MED ORDER — ONDANSETRON HCL 4 MG/2ML IJ SOLN
INTRAMUSCULAR | Status: AC
  Administered 2016-04-08: 4 mg
  Filled 2016-04-08: qty 2

## 2016-04-08 MED ORDER — IOPAMIDOL (ISOVUE-300) INJECTION 61%
100.0000 mL | Freq: Once | INTRAVENOUS | Status: AC | PRN
  Administered 2016-04-08: 100 mL via INTRAVENOUS

## 2016-04-08 MED ORDER — SODIUM CHLORIDE 0.9 % IV SOLN
1000.0000 mL | Freq: Once | INTRAVENOUS | Status: AC
  Administered 2016-04-08: 1000 mL via INTRAVENOUS

## 2016-04-08 NOTE — ED Provider Notes (Signed)
[facility] Emergency Department Provider Note   ____________________________________________    I have reviewed the triage vital signs and the nursing notes.   HISTORY  Chief Complaint Flank Pain and Abdominal Pain     HPI Taylor Tyler is a 75 y.o. female who presents with complaints of right-sided flank pain which she says has been bothering her for approximately one month. She was told by her PCP that it was a kidney stone causing her pain and that she would pass it but today she reports her pain as a 7 out of 10 and sharp in nature. She denies dysuria. No hematuria. No fevers or chills. No nausea or vomiting. She had a CT scan done one month ago apparently   Past Medical History:  Diagnosis Date  . Diabetes mellitus without complication ([place])   . Hypertension     Patient Active Problem List   Diagnosis Date Noted  . Disease of pancreas 07/21/2012    Past Surgical History:  Procedure Laterality Date  . ABDOMINAL HYSTERECTOMY  1987  . BREAST SURGERY  1992   Breast Reduction  . CHOLECYSTECTOMY  1982  . LUMBAR LAMINECTOMY WITH SPINOUS PROCESS PLATE 2 LEVEL N/A [id number]   Procedure: LUMBAR LAMINECTOMY/DECOMPRESSION MICRODISCECTOMY CoFlex;  Surgeon: Faythe Ghee, MD;  Location: [facility];  Service: Neurosurgery;  Laterality: N/A;  Lumbar three-four,Lumbar Four-Five Laminectomy with Coflex    Prior to Admission medications   Medication Sig Start Date End Date Taking? Authorizing Provider  acetaminophen (TYLENOL) 650 MG CR tablet Take 650 mg by mouth daily as needed for pain.    Historical Provider, MD  carvedilol (COREG) 25 MG tablet  11/20/15   Historical Provider, MD  cyclobenzaprine (FLEXERIL) 10 MG tablet Take 10 mg by mouth at bedtime.    Historical Provider, MD  ergocalciferol (VITAMIN D2) 50000 UNITS capsule Take 50,000 Units by mouth once a week. On Fridays    Historical Provider, MD  furosemide (LASIX) 20 MG tablet Take 20 mg by  mouth 2 (two) times daily.    Historical Provider, MD  HYDROcodone-acetaminophen (NORCO/VICODIN) 5-325 MG per tablet Take 1-2 tablets by mouth every 4 (four) hours as needed. 03/02/13   Karie Chimera, MD  levothyroxine (SYNTHROID, LEVOTHROID) 112 MCG tablet  11/20/15   Historical Provider, MD  lisinopril-hydrochlorothiazide (PRINZIDE,ZESTORETIC) 20-12.5 MG per tablet Take 1 tablet by mouth daily.    Historical Provider, MD  losartan (COZAAR) 100 MG tablet  11/20/15   Historical Provider, MD  metFORMIN (GLUCOPHAGE) 500 MG tablet Take 500 mg by mouth 2 (two) times daily with a meal.    Historical Provider, MD  Multiple Vitamin (MULTIVITAMIN WITH MINERALS) TABS Take 1 tablet by mouth daily.    Historical Provider, MD  nabumetone (RELAFEN) 500 MG tablet  09/07/15   Historical Provider, MD  ONE TOUCH ULTRA TEST test strip  10/18/15   Historical Provider, MD  [place]  10/18/15   Historical Provider, MD  potassium chloride SA (K-DUR,KLOR-CON) 20 MEQ tablet Take 20 mEq by mouth daily.    Historical Provider, MD  simvastatin (ZOCOR) 40 MG tablet Take 40 mg by mouth at bedtime.    Historical Provider, MD     Allergies Hydralazine  No family history on file.  Social History Social History  Substance Use Topics  . Smoking status: Never Smoker  . Smokeless tobacco: Never Used  . Alcohol use No    Review of Systems  Constitutional: No fever/chills  Eyes: No visual changes.  ENT: No sore throat. Cardiovascular: Denies chest pain. Respiratory: Denies Cough Gastrointestinal:As above Genitourinary: Negative for dysuria. Musculoskeletal: Negative for back pain. Skin: Negative for rash. Neurological: Negative for headaches   10-point ROS otherwise negative.  ____________________________________________   PHYSICAL EXAM:  VITAL SIGNS: ED Triage Vitals  Enc Vitals Group     BP 04/08/16 0821 (!) 215/72     Pulse Rate 04/08/16 0821 (!) 54     Resp 04/08/16 0821 15      Temp 04/08/16 0821 99 F (37.2 C)     Temp Source 04/08/16 0821 Oral     SpO2 04/08/16 0821 96 %     Weight 04/08/16 0818 220 lb (99.8 kg)     Height 04/08/16 0818 5\' 2"  (1.575 m)     Head Circumference --      Peak Flow --      Pain Score 04/08/16 0818 9     Pain Loc --      Pain Edu? --      Excl. in [place]? --     Constitutional: Alert and oriented. Uncomfortable. Pleasant and interactive Eyes: Conjunctivae are normal.  Head: Atraumatic. Nose: No congestion/rhinnorhea. Mouth/Throat: Mucous membranes are moist.   Cardiovascular: Normal rate, regular rhythm. Grossly normal heart sounds.  Good peripheral circulation. Respiratory: Normal respiratory effort.  No retractions. Lungs CTAB. Gastrointestinal: Mild tenderness to palpation in the right upper and right lower quadrant. No distention.  No CVA tenderness. Genitourinary: deferred Musculoskeletal: No lower extremity tenderness nor edema.  Warm and well perfused Neurologic:  Normal speech and language. No gross focal neurologic deficits are appreciated.  Skin:  Skin is warm, dry and intact. No rash noted. Psychiatric: Mood and affect are normal. Speech and behavior are normal.  ____________________________________________   LABS (all labs ordered are listed, but only abnormal results are displayed)  Labs Reviewed  CBC - Abnormal; Notable for the following:       Result Value   Hemoglobin 11.7 (*)    All other components within normal limits  COMPREHENSIVE METABOLIC PANEL - Abnormal; Notable for the following:    BUN 30 (*)    Creatinine, Ser 1.08 (*)    Albumin 3.3 (*)    Total Bilirubin <0.1 (*)    GFR calc non Af Amer 50 (*)    GFR calc Af Amer 58 (*)    All other components within normal limits  URINALYSIS COMPLETEWITH MICROSCOPIC ([facility] ONLY) - Abnormal; Notable for the following:    Color, Urine STRAW (*)    APPearance CLEAR (*)    Protein, ur 100 (*)    Squamous Epithelial / LPF 0-5 (*)    All other components  within normal limits  LIPASE, BLOOD   ____________________________________________  EKG  None ____________________________________________  RADIOLOGY  CT scan performed ____________________________________________   PROCEDURES  Procedure(s) performed: No    Critical Care performed: No ____________________________________________   INITIAL IMPRESSION / ASSESSMENT AND PLAN / ED COURSE  Pertinent labs & imaging results that were available during my care of the patient were reviewed by me and considered in my medical decision making (see chart for details).  Patient overall well-appearing but complains of right flank pain which is moderate to severe. Review of CT scan from approximately one month ago does not show a right-sided kidney stone which is concerning. We will reimage, obtain labs and give morphine and Zofran for pain  Clinical Course  CT abdomen and pelvis does not show  any acute abnormalities. Patient is pain-free after treatment. Feel she is appropriate for discharge this point with close follow-up with her PCP ____________________________________________   FINAL CLINICAL IMPRESSION(S) / ED DIAGNOSES  Final diagnoses:  Right flank pain      NEW MEDICATIONS STARTED DURING THIS VISIT:  Discharge Medication List as of 04/08/2016 11:51 AM       Note:  This document was prepared using Dragon voice recognition software and may include unintentional dictation errors.    Lavonia Drafts, MD 04/08/16 [phone]

## 2016-04-08 NOTE — ED Notes (Signed)
Pt called out for vomiting. edp notified and orders for zofran given as ordered. See mar.

## 2016-04-08 NOTE — ED Triage Notes (Signed)
Pt reports rt flank pain that radiates into lower abd that began over a month ago.

## 2016-04-18 ENCOUNTER — Encounter (INDEPENDENT_AMBULATORY_CARE_PROVIDER_SITE_OTHER): Payer: Medicare Other | Admitting: Ophthalmology

## 2016-04-18 DIAGNOSIS — E113311 Type 2 diabetes mellitus with moderate nonproliferative diabetic retinopathy with macular edema, right eye: Secondary | ICD-10-CM | POA: Diagnosis not present

## 2016-04-18 DIAGNOSIS — H43813 Vitreous degeneration, bilateral: Secondary | ICD-10-CM | POA: Diagnosis not present

## 2016-04-18 DIAGNOSIS — H35033 Hypertensive retinopathy, bilateral: Secondary | ICD-10-CM

## 2016-04-18 DIAGNOSIS — I10 Essential (primary) hypertension: Secondary | ICD-10-CM | POA: Diagnosis not present

## 2016-04-18 DIAGNOSIS — H34811 Central retinal vein occlusion, right eye, with macular edema: Secondary | ICD-10-CM | POA: Diagnosis not present

## 2016-04-18 DIAGNOSIS — E11311 Type 2 diabetes mellitus with unspecified diabetic retinopathy with macular edema: Secondary | ICD-10-CM

## 2016-04-18 DIAGNOSIS — E113292 Type 2 diabetes mellitus with mild nonproliferative diabetic retinopathy without macular edema, left eye: Secondary | ICD-10-CM

## 2016-05-27 ENCOUNTER — Encounter (INDEPENDENT_AMBULATORY_CARE_PROVIDER_SITE_OTHER): Payer: Medicare Other | Admitting: Ophthalmology

## 2016-05-27 DIAGNOSIS — E11311 Type 2 diabetes mellitus with unspecified diabetic retinopathy with macular edema: Secondary | ICD-10-CM | POA: Diagnosis not present

## 2016-05-27 DIAGNOSIS — I10 Essential (primary) hypertension: Secondary | ICD-10-CM

## 2016-05-27 DIAGNOSIS — E113292 Type 2 diabetes mellitus with mild nonproliferative diabetic retinopathy without macular edema, left eye: Secondary | ICD-10-CM | POA: Diagnosis not present

## 2016-05-27 DIAGNOSIS — H35033 Hypertensive retinopathy, bilateral: Secondary | ICD-10-CM

## 2016-05-27 DIAGNOSIS — E113311 Type 2 diabetes mellitus with moderate nonproliferative diabetic retinopathy with macular edema, right eye: Secondary | ICD-10-CM | POA: Diagnosis not present

## 2016-05-27 DIAGNOSIS — H43813 Vitreous degeneration, bilateral: Secondary | ICD-10-CM

## 2016-05-27 DIAGNOSIS — H34811 Central retinal vein occlusion, right eye, with macular edema: Secondary | ICD-10-CM | POA: Diagnosis not present

## 2016-06-24 ENCOUNTER — Encounter (INDEPENDENT_AMBULATORY_CARE_PROVIDER_SITE_OTHER): Payer: Medicare Other | Admitting: Ophthalmology

## 2016-06-24 DIAGNOSIS — I10 Essential (primary) hypertension: Secondary | ICD-10-CM

## 2016-06-24 DIAGNOSIS — H43813 Vitreous degeneration, bilateral: Secondary | ICD-10-CM

## 2016-06-24 DIAGNOSIS — H34811 Central retinal vein occlusion, right eye, with macular edema: Secondary | ICD-10-CM

## 2016-06-24 DIAGNOSIS — E113392 Type 2 diabetes mellitus with moderate nonproliferative diabetic retinopathy without macular edema, left eye: Secondary | ICD-10-CM

## 2016-06-24 DIAGNOSIS — H35033 Hypertensive retinopathy, bilateral: Secondary | ICD-10-CM

## 2016-06-24 DIAGNOSIS — E11311 Type 2 diabetes mellitus with unspecified diabetic retinopathy with macular edema: Secondary | ICD-10-CM | POA: Diagnosis not present

## 2016-06-24 DIAGNOSIS — E113311 Type 2 diabetes mellitus with moderate nonproliferative diabetic retinopathy with macular edema, right eye: Secondary | ICD-10-CM

## 2016-07-06 ENCOUNTER — Emergency Department: Payer: Medicare Other

## 2016-07-06 ENCOUNTER — Inpatient Hospital Stay
Admission: EM | Admit: 2016-07-06 | Discharge: 2016-07-10 | DRG: 305 | Disposition: A | Discharge status: Home / Self Care | Payer: Medicare Other | Attending: Internal Medicine | Admitting: Internal Medicine

## 2016-07-06 DIAGNOSIS — Z9049 Acquired absence of other specified parts of digestive tract: Secondary | ICD-10-CM

## 2016-07-06 DIAGNOSIS — I10 Essential (primary) hypertension: Secondary | ICD-10-CM | POA: Diagnosis present

## 2016-07-06 DIAGNOSIS — E039 Hypothyroidism, unspecified: Secondary | ICD-10-CM | POA: Diagnosis present

## 2016-07-06 DIAGNOSIS — R1031 Right lower quadrant pain: Secondary | ICD-10-CM

## 2016-07-06 DIAGNOSIS — R079 Chest pain, unspecified: Secondary | ICD-10-CM

## 2016-07-06 DIAGNOSIS — N12 Tubulo-interstitial nephritis, not specified as acute or chronic: Secondary | ICD-10-CM | POA: Diagnosis present

## 2016-07-06 DIAGNOSIS — N390 Urinary tract infection, site not specified: Secondary | ICD-10-CM

## 2016-07-06 DIAGNOSIS — R001 Bradycardia, unspecified: Secondary | ICD-10-CM | POA: Diagnosis present

## 2016-07-06 DIAGNOSIS — Z90710 Acquired absence of both cervix and uterus: Secondary | ICD-10-CM | POA: Diagnosis not present

## 2016-07-06 DIAGNOSIS — Z7984 Long term (current) use of oral hypoglycemic drugs: Secondary | ICD-10-CM | POA: Diagnosis not present

## 2016-07-06 DIAGNOSIS — Z87442 Personal history of urinary calculi: Secondary | ICD-10-CM | POA: Diagnosis not present

## 2016-07-06 DIAGNOSIS — I160 Hypertensive urgency: Secondary | ICD-10-CM | POA: Diagnosis present

## 2016-07-06 DIAGNOSIS — I517 Cardiomegaly: Secondary | ICD-10-CM | POA: Diagnosis present

## 2016-07-06 DIAGNOSIS — Z79899 Other long term (current) drug therapy: Secondary | ICD-10-CM | POA: Diagnosis not present

## 2016-07-06 DIAGNOSIS — Z888 Allergy status to other drugs, medicaments and biological substances status: Secondary | ICD-10-CM

## 2016-07-06 DIAGNOSIS — Z9889 Other specified postprocedural states: Secondary | ICD-10-CM | POA: Diagnosis not present

## 2016-07-06 DIAGNOSIS — R262 Difficulty in walking, not elsewhere classified: Secondary | ICD-10-CM

## 2016-07-06 DIAGNOSIS — E119 Type 2 diabetes mellitus without complications: Secondary | ICD-10-CM | POA: Diagnosis present

## 2016-07-06 LAB — URINALYSIS COMPLETE WITH MICROSCOPIC (ARMC ONLY)
Bilirubin Urine: NEGATIVE
GLUCOSE, UA: NEGATIVE mg/dL
HGB URINE DIPSTICK: NEGATIVE
Ketones, ur: NEGATIVE mg/dL
Nitrite: POSITIVE — AB
PROTEIN: 100 mg/dL — AB
Specific Gravity, Urine: 1.019 (ref 1.005–1.030)
pH: 5 (ref 5.0–8.0)

## 2016-07-06 LAB — GLUCOSE, CAPILLARY
GLUCOSE-CAPILLARY: 99 mg/dL (ref 65–99)
Glucose-Capillary: 148 mg/dL — ABNORMAL HIGH (ref 65–99)

## 2016-07-06 LAB — CBC
HEMATOCRIT: 33.8 % — AB (ref 35.0–47.0)
Hemoglobin: 11.4 g/dL — ABNORMAL LOW (ref 12.0–16.0)
MCH: 30 pg (ref 26.0–34.0)
MCHC: 33.6 g/dL (ref 32.0–36.0)
MCV: 89.5 fL (ref 80.0–100.0)
PLATELETS: 224 10*3/uL (ref 150–440)
RBC: 3.78 MIL/uL — AB (ref 3.80–5.20)
RDW: 14.1 % (ref 11.5–14.5)
WBC: 6.1 10*3/uL (ref 3.6–11.0)

## 2016-07-06 LAB — TROPONIN I: Troponin I: <0.03 ng/mL (ref <0.03)

## 2016-07-06 LAB — BASIC METABOLIC PANEL
ANION GAP: 5 (ref 5–15)
BUN: 30 mg/dL — ABNORMAL HIGH (ref 6–20)
CALCIUM: 9.4 mg/dL (ref 8.9–10.3)
CO2: 24 mmol/L (ref 22–32)
Chloride: 112 mmol/L — ABNORMAL HIGH (ref 101–111)
Creatinine, Ser: 1.23 mg/dL — ABNORMAL HIGH (ref 0.44–1.00)
GFR, EST AFRICAN AMERICAN: 49 mL/min — AB (ref >60)
GFR, EST NON AFRICAN AMERICAN: 42 mL/min — AB (ref >60)
GLUCOSE: 96 mg/dL (ref 65–99)
POTASSIUM: 4.1 mmol/L (ref 3.5–5.1)
Sodium: 141 mmol/L (ref 135–145)

## 2016-07-06 LAB — MRSA PCR SCREENING: MRSA BY PCR: POSITIVE — AB

## 2016-07-06 MED ORDER — VITAMIN D (ERGOCALCIFEROL) 1.25 MG (50000 UNIT) PO CAPS: 50000.0000 [IU] | ORAL_CAPSULE | ORAL | Status: DC

## 2016-07-06 MED ORDER — ONDANSETRON HCL 4 MG PO TABS: 4.0000 mg | ORAL_TABLET | Freq: Four times a day (QID) | ORAL | Status: DC | PRN

## 2016-07-06 MED ORDER — DEXTROSE 5 % IV SOLN: 1.0000 g | Freq: Once | INTRAVENOUS | Status: DC

## 2016-07-06 MED ORDER — NITROGLYCERIN 0.4 MG SL SUBL: 0.4000 mg | SUBLINGUAL_TABLET | SUBLINGUAL | Status: DC | PRN

## 2016-07-06 MED ORDER — ENALAPRILAT 1.25 MG/ML IV SOLN
0.6250 mg | INTRAVENOUS | Status: AC
  Administered 2016-07-06: 0.625 mg via INTRAVENOUS
  Filled 2016-07-06: qty 2

## 2016-07-06 MED ORDER — SIMVASTATIN 20 MG PO TABS
40.0000 mg | ORAL_TABLET | Freq: Every day | ORAL | Status: DC
  Administered 2016-07-06 – 2016-07-07 (×2): 40 mg via ORAL
  Filled 2016-07-06 (×2): qty 2

## 2016-07-06 MED ORDER — CARVEDILOL 25 MG PO TABS: 25.0000 mg | ORAL_TABLET | Freq: Two times a day (BID) | ORAL | Status: DC

## 2016-07-06 MED ORDER — LOSARTAN POTASSIUM 50 MG PO TABS
100.0000 mg | ORAL_TABLET | Freq: Every day | ORAL | Status: DC
  Administered 2016-07-06: 100 mg via ORAL
  Filled 2016-07-06: qty 2

## 2016-07-06 MED ORDER — CARVEDILOL 25 MG PO TABS
25.0000 mg | ORAL_TABLET | Freq: Two times a day (BID) | ORAL | Status: DC
  Administered 2016-07-06: 25 mg via ORAL

## 2016-07-06 MED ORDER — CEFTRIAXONE SODIUM-DEXTROSE 1-3.74 GM-% IV SOLR
1.0000 g | INTRAVENOUS | Status: DC
  Administered 2016-07-07 – 2016-07-09 (×3): 1 g via INTRAVENOUS
  Filled 2016-07-06 (×3): qty 50

## 2016-07-06 MED ORDER — MORPHINE SULFATE (PF) 4 MG/ML IV SOLN
INTRAVENOUS | Status: AC
  Filled 2016-07-06: qty 1

## 2016-07-06 MED ORDER — SODIUM CHLORIDE 0.9% FLUSH
3.0000 mL | Freq: Two times a day (BID) | INTRAVENOUS | Status: DC
  Administered 2016-07-06 – 2016-07-10 (×8): 3 mL via INTRAVENOUS

## 2016-07-06 MED ORDER — ENALAPRILAT 1.25 MG/ML IV SOLN
0.6250 mg | Freq: Once | INTRAVENOUS | Status: AC
  Administered 2016-07-06: 0.625 mg via INTRAVENOUS
  Filled 2016-07-06: qty 2

## 2016-07-06 MED ORDER — NICARDIPINE HCL IN NACL 20-0.86 MG/200ML-% IV SOLN
3.0000 mg/h | INTRAVENOUS | Status: DC
  Administered 2016-07-06 (×2): 5 mg/h via INTRAVENOUS
  Administered 2016-07-07 (×3): 7.5 mg/h via INTRAVENOUS
  Administered 2016-07-07 – 2016-07-08 (×2): 5 mg/h via INTRAVENOUS
  Administered 2016-07-08: 7.5 mg/h via INTRAVENOUS
  Filled 2016-07-06 (×12): qty 200

## 2016-07-06 MED ORDER — INSULIN ASPART 100 UNIT/ML ~~LOC~~ SOLN: 0.0000 [IU] | Freq: Every day | SUBCUTANEOUS | Status: DC

## 2016-07-06 MED ORDER — SODIUM CHLORIDE 0.9 % IV BOLUS (SEPSIS)
1000.0000 mL | Freq: Once | INTRAVENOUS | Status: AC
  Administered 2016-07-06: 1000 mL via INTRAVENOUS

## 2016-07-06 MED ORDER — DEXTROSE 5 % IV SOLN: 1.0000 g | INTRAVENOUS | Status: DC

## 2016-07-06 MED ORDER — ONDANSETRON HCL 4 MG/2ML IJ SOLN: 4.0000 mg | Freq: Four times a day (QID) | INTRAMUSCULAR | Status: DC | PRN

## 2016-07-06 MED ORDER — MORPHINE SULFATE (PF) 4 MG/ML IV SOLN
4.0000 mg | Freq: Once | INTRAVENOUS | Status: AC
  Administered 2016-07-06: 4 mg via INTRAVENOUS
  Filled 2016-07-06: qty 1

## 2016-07-06 MED ORDER — ACETAMINOPHEN 325 MG PO TABS: 650.0000 mg | ORAL_TABLET | Freq: Every day | ORAL | Status: DC | PRN

## 2016-07-06 MED ORDER — METOPROLOL TARTRATE 5 MG/5ML IV SOLN: 10.0000 mg | INTRAVENOUS | Status: DC | PRN

## 2016-07-06 MED ORDER — CARVEDILOL 25 MG PO TABS
25.0000 mg | ORAL_TABLET | Freq: Two times a day (BID) | ORAL | Status: DC
  Filled 2016-07-06: qty 1

## 2016-07-06 MED ORDER — ENOXAPARIN SODIUM 40 MG/0.4ML ~~LOC~~ SOLN
30.0000 mg | SUBCUTANEOUS | Status: DC
  Administered 2016-07-06: 30 mg via SUBCUTANEOUS
  Filled 2016-07-06: qty 0.4

## 2016-07-06 MED ORDER — LEVOTHYROXINE SODIUM 112 MCG PO TABS
112.0000 ug | ORAL_TABLET | Freq: Every day | ORAL | Status: DC
  Administered 2016-07-07 – 2016-07-10 (×4): 112 ug via ORAL
  Filled 2016-07-06 (×4): qty 1

## 2016-07-06 MED ORDER — ONDANSETRON HCL 4 MG/2ML IJ SOLN
4.0000 mg | Freq: Once | INTRAMUSCULAR | Status: AC
  Administered 2016-07-06: 4 mg via INTRAVENOUS
  Filled 2016-07-06: qty 2

## 2016-07-06 MED ORDER — ASPIRIN 81 MG PO CHEW
324.0000 mg | CHEWABLE_TABLET | Freq: Once | ORAL | Status: AC
  Administered 2016-07-06: 324 mg via ORAL
  Filled 2016-07-06: qty 4

## 2016-07-06 MED ORDER — INSULIN ASPART 100 UNIT/ML ~~LOC~~ SOLN
0.0000 [IU] | Freq: Three times a day (TID) | SUBCUTANEOUS | Status: DC
  Administered 2016-07-08: 1 [IU] via SUBCUTANEOUS
  Filled 2016-07-06: qty 1

## 2016-07-06 MED ORDER — OXYCODONE HCL 5 MG PO TABS
5.0000 mg | ORAL_TABLET | ORAL | Status: DC | PRN
  Administered 2016-07-08 – 2016-07-09 (×3): 5 mg via ORAL
  Filled 2016-07-06 (×4): qty 1

## 2016-07-06 MED ORDER — CEFTRIAXONE SODIUM-DEXTROSE 1-3.74 GM-% IV SOLR
1.0000 g | Freq: Once | INTRAVENOUS | Status: AC
  Administered 2016-07-06: 1 g via INTRAVENOUS
  Filled 2016-07-06: qty 50

## 2016-07-06 MED ORDER — POTASSIUM CHLORIDE CRYS ER 20 MEQ PO TBCR
20.0000 meq | EXTENDED_RELEASE_TABLET | Freq: Every day | ORAL | Status: DC
  Administered 2016-07-07 – 2016-07-10 (×4): 20 meq via ORAL
  Filled 2016-07-06 (×4): qty 1

## 2016-07-06 NOTE — ED Notes (Signed)
Pt reports intermittent chest pain that came and went since she has been here.  Heart rate in the 40s with pulse ox, placed pt on heart monitor and ran EKG. Discussed with both Dr. Corky Downs who signed off on the EKG and Dr. Reita Cliche, the EDP for this patient.  Pt states the chest pain is not present at this time, but was a little bit ago. Described the pain as tightness around a 8/10.

## 2016-07-06 NOTE — ED Notes (Signed)
Dr. Reita Cliche aware of BP. New order given.

## 2016-07-06 NOTE — ED Provider Notes (Signed)
[facility] Emergency Department Provider Note ____________________________________________   I have reviewed the triage vital signs and the triage nursing note.  HISTORY  Chief Complaint Flank Pain and Abdominal Pain   Historian Patient  HPI Taylor Tyler is a 75 y.o. female who is presenting today with what sounds like several months of right-sided flank pain. She states that occasionally she feels it more in her right side abdomen, but the last couple days she feels it mostly in her right flank.  No chest pain or trouble breathing or cough. No fever. She states that she has had low back surgery by a physician in [place] several years ago. This morning she took Advil around 5 AM. She states that she typically takes ibuprofen or Advil for the right flank discomfort.  She was told in the past that had stones in her kidney, but she states she's never passed a kidney stone.  PCP is Dr. Ronnald Collum.    Past Medical History:  Diagnosis Date  . Diabetes mellitus without complication ([place])   . Hypertension     Patient Active Problem List   Diagnosis Date Noted  . Disease of pancreas 07/21/2012    Past Surgical History:  Procedure Laterality Date  . ABDOMINAL HYSTERECTOMY  1987  . BREAST SURGERY  1992   Breast Reduction  . CHOLECYSTECTOMY  1982  . LUMBAR LAMINECTOMY WITH SPINOUS PROCESS PLATE 2 LEVEL N/A [id number]/86/5784   Procedure: LUMBAR LAMINECTOMY/DECOMPRESSION MICRODISCECTOMY CoFlex;  Surgeon: Faythe Ghee, MD;  Location: [facility];  Service: Neurosurgery;  Laterality: N/A;  Lumbar three-four,Lumbar Four-Five Laminectomy with Coflex    Prior to Admission medications   Medication Sig Start Date End Date Taking? Authorizing Provider  acetaminophen (TYLENOL) 650 MG CR tablet Take 650 mg by mouth daily as needed for pain.   Yes Historical Provider, MD  carvedilol (COREG) 25 MG tablet 25 mg 2 (two) times daily.  11/20/15  Yes Historical Provider, MD   ergocalciferol (VITAMIN D2) 50000 UNITS capsule Take 50,000 Units by mouth once a week. On Fridays   Yes Historical Provider, MD  levothyroxine (SYNTHROID, LEVOTHROID) 112 MCG tablet Take 112 mcg by mouth daily.  11/20/15  Yes Historical Provider, MD  losartan (COZAAR) 100 MG tablet Take 100 mg by mouth daily.  11/20/15  Yes Historical Provider, MD  metFORMIN (GLUCOPHAGE) 500 MG tablet Take 500 mg by mouth 2 (two) times daily with a meal.   Yes Historical Provider, MD  potassium chloride SA (K-DUR,KLOR-CON) 20 MEQ tablet Take 20 mEq by mouth daily.   Yes Historical Provider, MD  simvastatin (ZOCOR) 40 MG tablet Take 40 mg by mouth at bedtime.   Yes Historical Provider, MD  HYDROcodone-acetaminophen (NORCO/VICODIN) 5-325 MG per tablet Take 1-2 tablets by mouth every 4 (four) hours as needed. 03/02/13   Karie Chimera, MD  lisinopril-hydrochlorothiazide (PRINZIDE,ZESTORETIC) 20-12.5 MG per tablet Take 1 tablet by mouth daily.    Historical Provider, MD  ONE TOUCH ULTRA TEST test strip  10/18/15   Historical Provider, MD  Jonetta Speak LANCETS FINE [place]  10/18/15   Historical Provider, MD    Allergies  Allergen Reactions  . Hydralazine Other (See Comments)    No family history on file.  Social History Social History  Substance Use Topics  . Smoking status: Never Smoker  . Smokeless tobacco: Never Used  . Alcohol use No    Review of Systems  Constitutional: Negative for fever. Eyes: Negative for visual changes. ENT: Negative for sore  throat. Cardiovascular: Negative for chest pain. Respiratory: Negative for shortness of breath. Gastrointestinal: Negative for  vomiting and diarrhea. Genitourinary: Negative for dysuria. Musculoskeletal: Positive for right flank pain. Skin: Negative for rash. Neurological: Negative for headache. 10 point Review of Systems otherwise negative ____________________________________________   PHYSICAL EXAM:  VITAL SIGNS: ED Triage Vitals  Enc Vitals  Group     BP 07/06/16 1147 (!) 235/65     Pulse Rate 07/06/16 1147 (!) 57     Resp 07/06/16 1147 18     Temp 07/06/16 1147 98.6 F (37 C)     Temp Source 07/06/16 1147 Oral     SpO2 07/06/16 1147 97 %     Weight 07/06/16 1149 215 lb (97.5 kg)     Height 07/06/16 1149 5\' 2"  (1.575 m)     Head Circumference --      Peak Flow --      Pain Score 07/06/16 1150 9     Pain Loc --      Pain Edu? --      Excl. in [place]? --      Constitutional: Alert and oriented. Well appearing and in no distress. HEENT   Head: Normocephalic and atraumatic.      Eyes: Conjunctivae are normal. PERRL. Normal extraocular movements.      Ears:         Nose: No congestion/rhinnorhea.   Mouth/Throat: Mucous membranes are moist.   Neck: No stridor. Cardiovascular/Chest: Normal rate, regular rhythm.  No murmurs, rubs, or gallops. Respiratory: Normal respiratory effort without tachypnea nor retractions. Breath sounds are clear and equal bilaterally. No wheezes/rales/rhonchi. Gastrointestinal: Soft. No distention, no guarding, no rebound. Obese. Some right flank discomfort.  Genitourinary/rectal:Deferred Musculoskeletal: No midline tenderness over the spine.  Nontender with normal range of motion in all extremities. No joint effusions.  No lower extremity tenderness.  No edema. Neurologic:  Normal speech and language. No gross or focal neurologic deficits are appreciated. Skin:  Skin is warm, dry and intact. No rash noted. Psychiatric: Mood and affect are normal. Speech and behavior are normal. Patient exhibits appropriate insight and judgment.   ____________________________________________  LABS (pertinent positives/negatives)  Labs Reviewed  URINALYSIS COMPLETEWITH MICROSCOPIC ([facility] ONLY) - Abnormal; Notable for the following:       Result Value   Color, Urine YELLOW (*)    APPearance HAZY (*)    Protein, ur 100 (*)    Nitrite POSITIVE (*)    Leukocytes, UA 2+ (*)    Bacteria, UA MANY (*)     Squamous Epithelial / LPF 0-5 (*)    All other components within normal limits  CBC - Abnormal; Notable for the following:    RBC 3.78 (*)    Hemoglobin 11.4 (*)    HCT 33.8 (*)    All other components within normal limits  BASIC METABOLIC PANEL - Abnormal; Notable for the following:    Chloride 112 (*)    BUN 30 (*)    Creatinine, Ser 1.23 (*)    GFR calc non Af Amer 42 (*)    GFR calc Af Amer 49 (*)    All other components within normal limits  URINE CULTURE  TROPONIN I    ____________________________________________    EKG I, Lisa Roca, MD, the attending physician have personally viewed and interpreted all ECGs.  47 bpm. Sinus bradycardia. Narrow QRS. Normal axis. LVH with T-wave inversions laterally ____________________________________________  RADIOLOGY All Xrays were viewed by me. Imaging interpreted by Radiologist.  Chest  Portable 1 view:  No acute findings __________________________________________  PROCEDURES  Procedure(s) performed: None  Critical Care performed: CRITICAL CARE Performed by: Lisa Roca   Total critical care time: 30 minutes  Critical care time was exclusive of separately billable procedures and treating other patients.  Critical care was necessary to treat or prevent imminent or life-threatening deterioration.  Critical care was time spent personally by me on the following activities: development of treatment plan with patient and/or surrogate as well as nursing, discussions with consultants, evaluation of patient's response to treatment, examination of patient, obtaining history from patient or surrogate, ordering and performing treatments and interventions, ordering and review of laboratory studies, ordering and review of radiographic studies, pulse oximetry and re-evaluation of patient's condition.   ____________________________________________   ED COURSE / ASSESSMENT AND PLAN  Pertinent labs & imaging results that were  available during my care of the patient were reviewed by me and considered in my medical decision making (see chart for details).   Ms. Kohlman is here for evaluation of right flank discomfort and occasionally extends out to the right abdomen, but currently no abdominal pain. Sounds like symptoms have actually been ongoing for several months now without a formal diagnosis.  I reviewed her previous Epic imaging, and she had a CT done in September which she states was due to the same pains, there is no acute findings to explain the discomfort.  Her symptoms seem potentially most likely due to pinched nerve, especially given her history of known prior low back pain with prior history of surgery. There is no neurologic complaints by history or on exam and I don't think that she has an acute neurologic emergency going on.  Today with reassuring neurologic exam, and laboratory studies, I'm not recommending any additional advanced imaging at this point in time.   She developed an episode of chest discomfort without associated symptoms, and ECG was obtained showing sinus bradycardia and LVH with reported changes.  Troponin was drawn. This came back negative.  Patient's blood pressure still significantly elevated despite extra by mouth dose of metoprolol. I chose not to order additional beta blocker due to low hr 50s.  I did order 1 dose of IV enalapril at due to allergy reported to hydralazine.  Currently she is asymptomatic, but again she did have the episode of chest discomfort with the foot elevated blood pressures.  I did pull up an old EKG from 2015 which shows that today's T-wave inversions and mild ST segment depression laterally which appeared to be associated with LVH, or somewhat worse or different from previous.  Given the persistently elevated blood pressures, with the episode of chest discomfort, and EKG changes, I am going to discuss hospital observation admission with the  patient.  Blood pressure map decreased after IV enalapril dose from 115 to 120 down to 104.  She is chest pain-free, and actually her right flank pain is also resolved. I'm not suspicious of intra-abdominal or vascular emergency.  CONSULTATIONS:   Hospitalist for admission.   Patient / Family / Caregiver informed of clinical course, medical decision-making process, and agree with plan.    ___________________________________________   FINAL CLINICAL IMPRESSION(S) / ED DIAGNOSES   Final diagnoses:  Urinary tract infection without hematuria, site unspecified  Essential hypertension  Chest pain, unspecified type  Hypertensive urgency              Note: This dictation was prepared with Dragon dictation. Any transcriptional errors that result from this  process are unintentional    Lisa Roca, MD 07/06/16 [phone]

## 2016-07-06 NOTE — ED Notes (Signed)
Pt reports she was seen in the past for kidney stone. Reports right flank pain 9/10.  Pt states she has had urinary frequency as well. Pt had CT scan done and was told she did have a stone. Pt urinated prior to arrival unable to go at this time.

## 2016-07-06 NOTE — H&P (Signed)
[facility] [place] NAME: Taylor Tyler    MR#:  [id number]  DATE OF BIRTH:  10-Jun-1941  DATE OF ADMISSION:  07/06/2016  PRIMARY CARE PHYSICIAN: Lenard Simmer, MD   REQUESTING/REFERRING PHYSICIAN: Lord  CHIEF COMPLAINT:  Abdominal pain, right flank pain and chest pain  HISTORY OF PRESENT ILLNESS:  Taylor Tyler  is a 75 y.o. female with a known history of Essential hypertension diabetes mellitus is presenting to the emergency department with a chief complaint of chronic right-sided flank pain. For the past 2 days the right flank pain is getting worse, this morning she took Advil at around 5 AM with no significant improvement. Also she started having intermittent episodes of chest pain which made her come to the emergency department. Initial troponin is negative. EKG has revealed the left ventricle hypertrophy subtle T-wave inversions which are nonspecific. UA is abnormal and patient's   Systolic blood pressure is at around 252-264 with map 130 .patient reports chronic floaters and has been following up with ophthalmologist as an outpatient. Denies any worsening of the floaters are headache or blurry vision .during my examination patient is chest pain-free   PAST MEDICAL HISTORY:   Past Medical History:  Diagnosis Date  . Diabetes mellitus without complication ([place])   . Hypertension     PAST SURGICAL HISTOIRY:   Past Surgical History:  Procedure Laterality Date  . ABDOMINAL HYSTERECTOMY  1987  . BREAST SURGERY  1992   Breast Reduction  . CHOLECYSTECTOMY  1982  . LUMBAR LAMINECTOMY WITH SPINOUS PROCESS PLATE 2 LEVEL N/A 5/63/8756   Procedure: LUMBAR LAMINECTOMY/DECOMPRESSION MICRODISCECTOMY CoFlex;  Surgeon: Faythe Ghee, MD;  Location: [facility];  Service: Neurosurgery;  Laterality: N/A;  Lumbar three-four,Lumbar Four-Five Laminectomy with Coflex    SOCIAL HISTORY:   Social History  Substance Use Topics  . Smoking status:  Never Smoker  . Smokeless tobacco: Never Used  . Alcohol use No    FAMILY HISTORY:  No family history on file.  DRUG ALLERGIES:   Allergies  Allergen Reactions  . Hydralazine Other (See Comments)    REVIEW OF SYSTEMS:  CONSTITUTIONAL: No fever, fatigue or weakness.  EYES: No blurred or double vision.  EARS, NOSE, AND THROAT: No tinnitus or ear pain.  RESPIRATORY: No cough, shortness of breath, wheezing or hemoptysis.  CARDIOVASCULAR: No chest pain, orthopnea, edema.  GASTROINTESTINAL: No nausea, vomiting, diarrhea  . Reporting lower abdominal pain and right-sided back pain GENITOURINARY: No dysuria, hematuria.  ENDOCRINE: No polyuria, nocturia,  HEMATOLOGY: No anemia, easy bruising or bleeding SKIN: No rash or lesion. MUSCULOSKELETAL: No joint pain or arthritis.   NEUROLOGIC: No tingling, numbness, weakness.  PSYCHIATRY: No anxiety or depression.   MEDICATIONS AT HOME:   Prior to Admission medications   Medication Sig Start Date End Date Taking? Authorizing Provider  acetaminophen (TYLENOL) 650 MG CR tablet Take 650 mg by mouth daily as needed for pain.   Yes Historical Provider, MD  carvedilol (COREG) 25 MG tablet 25 mg 2 (two) times daily.  11/20/15  Yes Historical Provider, MD  ergocalciferol (VITAMIN D2) 50000 UNITS capsule Take 50,000 Units by mouth once a week. On Fridays   Yes Historical Provider, MD  levothyroxine (SYNTHROID, LEVOTHROID) 112 MCG tablet Take 112 mcg by mouth daily.  11/20/15  Yes Historical Provider, MD  losartan (COZAAR) 100 MG tablet Take 100 mg by mouth daily.  11/20/15  Yes Historical Provider, MD  metFORMIN (GLUCOPHAGE) 500 MG tablet  Take 500 mg by mouth 2 (two) times daily with a meal.   Yes Historical Provider, MD  potassium chloride SA (K-DUR,KLOR-CON) 20 MEQ tablet Take 20 mEq by mouth daily.   Yes Historical Provider, MD  simvastatin (ZOCOR) 40 MG tablet Take 40 mg by mouth at bedtime.   Yes Historical Provider, MD  HYDROcodone-acetaminophen  (NORCO/VICODIN) 5-325 MG per tablet Take 1-2 tablets by mouth every 4 (four) hours as needed. 03/02/13   Karie Chimera, MD  lisinopril-hydrochlorothiazide (PRINZIDE,ZESTORETIC) 20-12.5 MG per tablet Take 1 tablet by mouth daily.    Historical Provider, MD  ONE TOUCH ULTRA TEST test strip  10/18/15   Historical Provider, MD  Jonetta Speak LANCETS FINE [place]  10/18/15   Historical Provider, MD      VITAL SIGNS:  Blood pressure (!) 234/79, pulse (!) 48, temperature 98.6 F (37 C), temperature source Oral, resp. rate 13, height 5\' 2"  (1.575 m), weight 97.5 kg (215 lb), SpO2 97 %.  PHYSICAL EXAMINATION:  GENERAL:  75 y.o.-year-old patient lying in the bed with no acute distress.  EYES: Pupils equal, round, reactive to light and accommodation. No scleral icterus. Extraocular muscles intact.  HEENT: Head atraumatic, normocephalic. Oropharynx and nasopharynx clear.  NECK:  Supple, no jugular venous distention. No thyroid enlargement, no tenderness.  LUNGS: Normal breath sounds bilaterally, no wheezing, rales,rhonchi or crepitation. No use of accessory muscles of respiration.  CARDIOVASCULAR: S1, S2 normal. No murmurs, rubs, or gallops.  No anterior chest wall tenderness on palpation ABDOMEN: Soft, nontender, nondistended. Bowel sounds present. No organomegaly or mass.  Right flank is tender EXTREMITIES: No pedal edema, cyanosis, or clubbing.  NEUROLOGIC: Cranial nerves II through XII are intact. Muscle strength 5/5 in all extremities. Sensation intact. Gait not checked.  PSYCHIATRIC: The patient is alert and oriented x 3.  SKIN: No obvious rash, lesion, or ulcer.   LABORATORY PANEL:   CBC  Recent Labs Lab 07/06/16 1208  WBC 6.1  HGB 11.4*  HCT 33.8*  PLT 224   ------------------------------------------------------------------------------------------------------------------  Chemistries   Recent Labs Lab 07/06/16 1208  NA 141  K 4.1  CL 112*  CO2 24  GLUCOSE 96  BUN 30*   CREATININE 1.23*  CALCIUM 9.4   ------------------------------------------------------------------------------------------------------------------  Cardiac Enzymes  Recent Labs Lab 07/06/16 1508  TROPONINI <0.03   ------------------------------------------------------------------------------------------------------------------  RADIOLOGY:  Dg Chest Port 1 View  Result Date: 07/06/2016 CLINICAL DATA:  Intermittent chest pain and tightness. EXAM: PORTABLE CHEST 1 VIEW COMPARISON:  01/27/2014. FINDINGS: Trachea is midline. Heart is enlarged. Thoracic aorta is calcified. Lungs are clear. No pleural fluid. IMPRESSION: No acute findings. Electronically Signed   By: Lorin Picket M.D.   On: 07/06/2016 17:29    EKG:   Orders placed or performed during the hospital encounter of 07/06/16  . EKG 12-Lead  . EKG 12-Lead    IMPRESSION AND PLAN:   Taylor Tyler  is a 75 y.o. female with a known history of Essential hypertension diabetes mellitus is presenting to the emergency department with a chief complaint of chronic right-sided flank pain. For the past 2 days the right flank pain is getting worse, this morning she took Advil at around 5 AM with no significant improvement. Also she started having intermittent episodes of chest pain which made her come to the emergency department. Initial troponin is negative. EKG has revealed the left ventricle hypertrophy subtle T-wave inversions which are nonspecific. UA is abnormal  # Malignant hypertension with chest pain -admit to stepdown unit and  monitor patient closely on telemetry Cycle troponins -Patient was given Vaseretic IV boluses with no significant improvement. Patient is allergic to hydralazine and bradycardic at this time, we will start the patient on nicardipine drip to titrate systolic blood pressures to 180-190 -Continue her home medication Cozaar 100 mg and Coreg 25 mg by mouth twice a day with holding parameters, start her on amlodipine  and titrate as needed -Nitroglycerin as needed -Pain control as needed -Patient is reporting chronic black floaters, recommended her to follow up with ophthalmology as an outpatient    #Chest pain secondary to malignant hypertension Monitor patient on telemetry and cycle cardiac biomarkers Continue aspirin, statin, beta blocker  #Acute cystitis with acute on chronic right flank pain possible pyelonephritis Urinalysis is abnormal. Urine cultures are pending. Started patient on IV Rocephin. Gentle hydration with IV fluids  #Diabetes mellitus Hold metformin as renal function is slightly off Sliding scale insulin and check hemoglobin A1c  #Hypothyroid and continue Synthroid   DVT prophylaxis with Lovenox subcutaneous  All the records are reviewed and case discussed with ED provider. Management plans discussed with the patient, family and they are in agreement.  CODE STATUS: Full code, two sons are heard healthcare power of attorney  TOTAL CRITICAL CARE TIME TAKING CARE OF THIS PATIENT: 43 minutes.   Note: This dictation was prepared with Dragon dictation along with smaller phrase technology. Any transcriptional errors that result from this process are unintentional.  Nicholes Mango M.D on 07/06/2016 at 5:41 PM  Between 7am to 6pm - Pager - [phone]  After 6pm go to [email or web address] - password [email or web address]  Office  [phone]  CC: Primary care physician; Lenard Simmer, MD

## 2016-07-07 ENCOUNTER — Inpatient Hospital Stay: Payer: Medicare Other

## 2016-07-07 LAB — GLUCOSE, CAPILLARY
GLUCOSE-CAPILLARY: 85 mg/dL (ref 65–99)
GLUCOSE-CAPILLARY: 103 mg/dL — AB (ref 65–99)
GLUCOSE-CAPILLARY: 88 mg/dL (ref 65–99)
Glucose-Capillary: 93 mg/dL (ref 65–99)

## 2016-07-07 LAB — TROPONIN I: Troponin I: <0.03 ng/mL (ref <0.03)

## 2016-07-07 MED ORDER — HYDRALAZINE HCL 20 MG/ML IJ SOLN
20.0000 mg | INTRAMUSCULAR | Status: DC | PRN
  Administered 2016-07-08 (×2): 20 mg via INTRAVENOUS
  Filled 2016-07-07 (×2): qty 1

## 2016-07-07 MED ORDER — LABETALOL HCL 5 MG/ML IV SOLN: 10.0000 mg | INTRAVENOUS | Status: DC | PRN

## 2016-07-07 MED ORDER — DIPHENHYDRAMINE HCL 50 MG/ML IJ SOLN
25.0000 mg | Freq: Once | INTRAMUSCULAR | Status: AC
  Administered 2016-07-07: 25 mg via INTRAVENOUS
  Filled 2016-07-07: qty 1

## 2016-07-07 MED ORDER — DIPHENHYDRAMINE HCL 25 MG PO CAPS
25.0000 mg | ORAL_CAPSULE | Freq: Three times a day (TID) | ORAL | Status: DC | PRN
  Administered 2016-07-08: 25 mg via ORAL
  Filled 2016-07-07: qty 1

## 2016-07-07 MED ORDER — LABETALOL HCL 5 MG/ML IV SOLN
20.0000 mg | INTRAVENOUS | Status: DC | PRN
  Administered 2016-07-08 – 2016-07-09 (×5): 20 mg via INTRAVENOUS
  Filled 2016-07-07 (×5): qty 4

## 2016-07-07 MED ORDER — HYDRALAZINE HCL 20 MG/ML IJ SOLN
10.0000 mg | Freq: Once | INTRAMUSCULAR | Status: AC
  Administered 2016-07-07: 10 mg via INTRAVENOUS
  Filled 2016-07-07: qty 1

## 2016-07-07 MED ORDER — LOSARTAN POTASSIUM 50 MG PO TABS
100.0000 mg | ORAL_TABLET | Freq: Every day | ORAL | Status: DC
  Administered 2016-07-07 – 2016-07-10 (×4): 100 mg via ORAL
  Filled 2016-07-07 (×4): qty 2

## 2016-07-07 MED ORDER — AMLODIPINE BESYLATE 10 MG PO TABS
10.0000 mg | ORAL_TABLET | Freq: Every day | ORAL | Status: DC
  Administered 2016-07-07: 10 mg via ORAL
  Filled 2016-07-07: qty 1

## 2016-07-07 MED ORDER — CARVEDILOL 25 MG PO TABS: 25.0000 mg | ORAL_TABLET | Freq: Two times a day (BID) | ORAL | Status: DC

## 2016-07-07 MED ORDER — CHLORHEXIDINE GLUCONATE CLOTH 2 % EX PADS
6.0000 | MEDICATED_PAD | Freq: Every day | CUTANEOUS | Status: DC
  Administered 2016-07-08 – 2016-07-10 (×3): 6 via TOPICAL

## 2016-07-07 MED ORDER — CARVEDILOL 25 MG PO TABS
25.0000 mg | ORAL_TABLET | Freq: Two times a day (BID) | ORAL | Status: DC
  Administered 2016-07-07 – 2016-07-10 (×6): 25 mg via ORAL
  Filled 2016-07-07 (×7): qty 1

## 2016-07-07 MED ORDER — ENOXAPARIN SODIUM 40 MG/0.4ML ~~LOC~~ SOLN
40.0000 mg | SUBCUTANEOUS | Status: DC
  Administered 2016-07-07 – 2016-07-09 (×3): 40 mg via SUBCUTANEOUS
  Filled 2016-07-07 (×3): qty 0.4

## 2016-07-07 MED ORDER — MUPIROCIN 2 % EX OINT
1.0000 "application " | TOPICAL_OINTMENT | Freq: Two times a day (BID) | CUTANEOUS | Status: DC
  Administered 2016-07-07 – 2016-07-10 (×8): 1 via NASAL
  Filled 2016-07-07: qty 22

## 2016-07-07 NOTE — Care Management (Signed)
Patient admitted to icu stepdown due to need for nicardipine drip to control blood pressure.  Systolic is still averaging over 200 and remains on Nicardipine infusion.  Prior to this episode of illness, she was Independent in all adls, denies issues accessing medical care, obtaining medications or with transportation.  Current with her PCP.

## 2016-07-07 NOTE — Progress Notes (Signed)
[facility] NAME: Taylor Tyler    MRN#:  [id number]  DATE OF BIRTH:  1941/02/13  SUBJECTIVE:  Hospital Day: 1 day Taylor Tyler is a 74 y.o. female presenting with Flank Pain and Abdominal Pain .   Overnight events: no overnight events Interval Events: no complaints  REVIEW OF SYSTEMS:  CONSTITUTIONAL: No fever, fatigue or weakness.  EYES: No blurred or double vision.  EARS, NOSE, AND THROAT: No tinnitus or ear pain.  RESPIRATORY: No cough, shortness of breath, wheezing or hemoptysis.  CARDIOVASCULAR: No chest pain, orthopnea, edema.  GASTROINTESTINAL: No nausea, vomiting, diarrhea or abdominal pain.  GENITOURINARY: No dysuria, hematuria.  ENDOCRINE: No polyuria, nocturia,  HEMATOLOGY: No anemia, easy bruising or bleeding SKIN: No rash or lesion. MUSCULOSKELETAL: No joint pain or arthritis.   NEUROLOGIC: No tingling, numbness, weakness.  PSYCHIATRY: No anxiety or depression.   DRUG ALLERGIES:   Allergies  Allergen Reactions  . Hydralazine Other (See Comments)    VITALS:  Blood pressure (!) 201/87, pulse 79, temperature 98.7 F (37.1 C), temperature source Axillary, resp. rate (!) 22, height 5\' 2"  (1.575 m), weight 97.5 kg (215 lb), SpO2 95 %.  PHYSICAL EXAMINATION:  VITAL SIGNS: Vitals:   07/07/16 1100 07/07/16 1200  BP: (!) 171/69 (!) 201/87  Pulse: (!) 59 79  Resp: 15 (!) 22  Temp:     GENERAL:75 y.o.female currently in no acute distress.  HEAD: Normocephalic, atraumatic.  EYES: Pupils equal, round, reactive to light. Extraocular muscles intact. No scleral icterus.  MOUTH: Moist mucosal membrane. Dentition intact. No abscess noted.  EAR, NOSE, THROAT: Clear without exudates. No external lesions.  NECK: Supple. No thyromegaly. No nodules. No JVD.  PULMONARY: Clear to ascultation, without wheeze rails or rhonci. No use of accessory muscles, Good respiratory effort. good air entry bilaterally CHEST:  Nontender to palpation.  CARDIOVASCULAR: S1 and S2. Regular rate and rhythm. No murmurs, rubs, or gallops. No edema. Pedal pulses 2+ bilaterally.  GASTROINTESTINAL: Soft, nontender, nondistended. No masses. Positive bowel sounds. No hepatosplenomegaly.  MUSCULOSKELETAL: No swelling, clubbing, or edema. Range of motion full in all extremities.  NEUROLOGIC: Cranial nerves II through XII are intact. No gross focal neurological deficits. Sensation intact. Reflexes intact.  SKIN: No ulceration, lesions, rashes, or cyanosis. Skin warm and dry. Turgor intact.  PSYCHIATRIC: Mood, affect within normal limits. The patient is awake, alert and oriented x 3. Insight, judgment intact.      LABORATORY PANEL:   CBC  Recent Labs Lab 07/06/16 1208  WBC 6.1  HGB 11.4*  HCT 33.8*  PLT 224   ------------------------------------------------------------------------------------------------------------------  Chemistries   Recent Labs Lab 07/06/16 1208  NA 141  K 4.1  CL 112*  CO2 24  GLUCOSE 96  BUN 30*  CREATININE 1.23*  CALCIUM 9.4   ------------------------------------------------------------------------------------------------------------------  Cardiac Enzymes  Recent Labs Lab 07/07/16 0616  TROPONINI <0.03   ------------------------------------------------------------------------------------------------------------------  RADIOLOGY:  Dg Chest Port 1 View  Result Date: 07/06/2016 CLINICAL DATA:  Intermittent chest pain and tightness. EXAM: PORTABLE CHEST 1 VIEW COMPARISON:  01/27/2014. FINDINGS: Trachea is midline. Heart is enlarged. Thoracic aorta is calcified. Lungs are clear. No pleural fluid. IMPRESSION: No acute findings. Electronically Signed   By: Lorin Picket M.D.   On: 07/06/2016 17:29    EKG:   Orders placed or performed during the hospital encounter of 07/06/16  . EKG 12-Lead  . EKG 12-Lead    ASSESSMENT AND PLAN:   Taylor Tyler  is a 75 y.o. female presenting  with Flank Pain and Abdominal Pain . Admitted 07/06/2016 : Day #: 1 day 1. Hypertensive urgency: Remains on nicardipine drip, add Norvasc with goal to wean off of the nicardipine 2. Pyelonephritis: Patient states history of kidney stone will check CAT scan if present will get urology involved otherwise continue current antibiotics ceftriaxone and follow culture data 3. Type 2 diabetes non-insulin-requiring hold oral agents and sliding scale coverage 4. Hypothyroidism unspecified: Synthroid   All the records are reviewed and case discussed with Care Management/Social Workerr. Management plans discussed with the patient, family and they are in agreement.  CODE STATUS: full TOTAL TIME TAKING CARE OF THIS PATIENT: 33 minutes.   POSSIBLE D/C IN 1-2DAYS, DEPENDING ON CLINICAL CONDITION.   Taylor Tyler,  Karenann Cai.D on 07/07/2016 at 1:24 PM  Between 7am to 6pm - Pager - [phone]  After 6pm: House Pager: - [facility]  Office  [phone]  CC: Primary care physician; Lenard Simmer, MD

## 2016-07-07 NOTE — Progress Notes (Signed)
Lovenox changed to 40 mg daily per protocol.

## 2016-07-07 NOTE — Progress Notes (Signed)
Pt has remained alert and oriented with c/o right lower flank pain 4/10. ABD CT today revealed a L non-obstructing stone and a wide based anterior ABD wall hernia containing bowel. Lung sounds remain clear on RA, sats >95%. RR even and unlabored. NSR/SB on cardiac monitor. Pt has remained hypertensive on cardene gtt at 5. Norvasc added today together with coreg to attempt to wean off cardene gtt. Pt asymptomatic with systolic 832N-191Y, HR 60A-00K.

## 2016-07-08 LAB — GLUCOSE, CAPILLARY
GLUCOSE-CAPILLARY: 149 mg/dL — AB (ref 65–99)
GLUCOSE-CAPILLARY: 153 mg/dL — AB (ref 65–99)
Glucose-Capillary: 118 mg/dL — ABNORMAL HIGH (ref 65–99)
Glucose-Capillary: 88 mg/dL (ref 65–99)

## 2016-07-08 LAB — HEMOGLOBIN A1C
HEMOGLOBIN A1C: 5.5 % (ref 4.8–5.6)
MEAN PLASMA GLUCOSE: 111 mg/dL

## 2016-07-08 MED ORDER — AMLODIPINE BESYLATE 10 MG PO TABS
10.0000 mg | ORAL_TABLET | Freq: Every day | ORAL | Status: DC
  Administered 2016-07-08 – 2016-07-10 (×3): 10 mg via ORAL
  Filled 2016-07-08 (×3): qty 1

## 2016-07-08 MED ORDER — ATORVASTATIN CALCIUM 20 MG PO TABS
20.0000 mg | ORAL_TABLET | Freq: Every day | ORAL | Status: DC
  Administered 2016-07-08 – 2016-07-09 (×2): 20 mg via ORAL
  Filled 2016-07-08 (×2): qty 1

## 2016-07-08 NOTE — Progress Notes (Signed)
[facility] [place] NAME: Taylor Tyler    MRN#:  [id number]  DATE OF BIRTH:  29-Jul-1941  SUBJECTIVE:  Hospital Day: 2 days Taylor Tyler is a 75 y.o. female presenting with Flank Pain and Abdominal Pain .   Overnight events: no overnight events Interval Events: Still some complaints right flank pain  REVIEW OF SYSTEMS:  CONSTITUTIONAL: No fever, fatigue or weakness.  EYES: No blurred or double vision.  EARS, NOSE, AND THROAT: No tinnitus or ear pain.  RESPIRATORY: No cough, shortness of breath, wheezing or hemoptysis.  CARDIOVASCULAR: No chest pain, orthopnea, edema.  GASTROINTESTINAL: No nausea, vomiting, diarrhea or abdominal pain.  GENITOURINARY: No dysuria, hematuria.  ENDOCRINE: No polyuria, nocturia,  HEMATOLOGY: No anemia, easy bruising or bleeding SKIN: No rash or lesion. MUSCULOSKELETAL: No joint pain or arthritis.   NEUROLOGIC: No tingling, numbness, weakness.  PSYCHIATRY: No anxiety or depression.   DRUG ALLERGIES:   Allergies  Allergen Reactions  . Hydralazine Other (See Comments)    VITALS:  Blood pressure (!) 202/82, pulse 71, temperature 98.7 F (37.1 C), temperature source Oral, resp. rate 18, height 5\' 2"  (1.575 m), weight 97.5 kg (215 lb), SpO2 97 %.  PHYSICAL EXAMINATION:  VITAL SIGNS: Vitals:   07/08/16 1200 07/08/16 1300  BP: (!) 181/61 (!) 202/82  Pulse: 69 71  Resp: 17 18  Temp:     GENERAL:75 y.o.female currently in no acute distress.  HEAD: Normocephalic, atraumatic.  EYES: Pupils equal, round, reactive to light. Extraocular muscles intact. No scleral icterus.  MOUTH: Moist mucosal membrane. Dentition intact. No abscess noted.  EAR, NOSE, THROAT: Clear without exudates. No external lesions.  NECK: Supple. No thyromegaly. No nodules. No JVD.  PULMONARY: Clear to ascultation, without wheeze rails or rhonci. No use of accessory muscles, Good respiratory effort. good air entry bilaterally CHEST:  Nontender to palpation.  CARDIOVASCULAR: S1 and S2. Regular rate and rhythm. No murmurs, rubs, or gallops. No edema. Pedal pulses 2+ bilaterally.  GASTROINTESTINAL: Soft, nontender, nondistended. No masses. Positive bowel sounds. No hepatosplenomegaly.  MUSCULOSKELETAL: No swelling, clubbing, or edema. Range of motion full in all extremities.  NEUROLOGIC: Cranial nerves II through XII are intact. No gross focal neurological deficits. Sensation intact. Reflexes intact.  SKIN: No ulceration, lesions, rashes, or cyanosis. Skin warm and dry. Turgor intact.  PSYCHIATRIC: Mood, affect within normal limits. The patient is awake, alert and oriented x 3. Insight, judgment intact.      LABORATORY PANEL:   CBC  Recent Labs Lab 07/06/16 1208  WBC 6.1  HGB 11.4*  HCT 33.8*  PLT 224   ------------------------------------------------------------------------------------------------------------------  Chemistries   Recent Labs Lab 07/06/16 1208  NA 141  K 4.1  CL 112*  CO2 24  GLUCOSE 96  BUN 30*  CREATININE 1.23*  CALCIUM 9.4   ------------------------------------------------------------------------------------------------------------------  Cardiac Enzymes  Recent Labs Lab 07/07/16 0616  TROPONINI <0.03   ------------------------------------------------------------------------------------------------------------------  RADIOLOGY:  Ct Abdomen Pelvis Wo Contrast  Result Date: 07/07/2016 CLINICAL DATA:  Pt complaints of right sided abdominal pain starting end of Sept and getting worse recently. Vomiting, nausea. Denies hx of stones EXAM: CT ABDOMEN AND PELVIS WITHOUT CONTRAST TECHNIQUE: Multidetector CT imaging of the abdomen and pelvis was performed following the standard protocol without IV contrast. 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 No acute findings in the lung bases. Heart is mildly enlarged.  Hepatobiliary: Normal liver. Gallbladder surgically absent.  Mild chronic dilation of the common bile duct with normal distal tapering, stable. Pancreas: Unremarkable. No pancreatic ductal dilatation or surrounding inflammatory changes. Spleen: Normal in size without focal abnormality. Adrenals/Urinary Tract: No adrenal masses. 6 mm nonobstructing stone lies in the lower pole of the left kidney. No other intrarenal stones. No renal masses. Mild bilateral renal cortical thinning. There is bilateral perinephric stranding, chronic. No hydronephrosis. Ureters are normal course and in caliber. No ureteral stones. Normal bladder. Stomach/Bowel: There has been a previous partial left colectomy. Anastomosis staple line lies along the mid sigmoid colon. There are several left colon diverticula. No diverticulitis. Colon is otherwise unremarkable. Normal stomach. No small bowel dilation or wall thickening. Wide-based midline, lower abdominal hernia, which contains loops of small bowel, without evidence of obstruction, incarceration or strangulation. No appendix visualized. Vascular/Lymphatic: Aortic atherosclerosis. No enlarged abdominal or pelvic lymph nodes. Reproductive: Status post hysterectomy. No adnexal masses. Other: No ascites. Musculoskeletal: No fracture or acute finding. Postsurgical and degenerative changes throughout the visualized spine. No osteoblastic or osteolytic lesions. IMPRESSION: 1. No acute findings within the abdomen or pelvis. No ureteral stones or obstructive uropathy. 2. Wide-based anterior abdominal wall hernia containing bowel, but without evidence of incarceration, strangulation or obstruction. This is stable from the prior study. 3. 6 mm nonobstructing stone in the lower pole the left kidney stable from the prior CT. 4. Status post cholecystectomy and hysterectomy and partial left colon resection. 5. Aortic atherosclerosis. Electronically Signed   By: Lajean Manes M.D.   On: 07/07/2016 13:26    Dg Chest Port 1 View  Result Date: 07/06/2016 CLINICAL DATA:  Intermittent chest pain and tightness. EXAM: PORTABLE CHEST 1 VIEW COMPARISON:  01/27/2014. FINDINGS: Trachea is midline. Heart is enlarged. Thoracic aorta is calcified. Lungs are clear. No pleural fluid. IMPRESSION: No acute findings. Electronically Signed   By: Lorin Picket M.D.   On: 07/06/2016 17:29    EKG:   Orders placed or performed during the hospital encounter of 07/06/16  . EKG 12-Lead  . EKG 12-Lead    ASSESSMENT AND PLAN:   Taylor Tyler is a 75 y.o. female presenting with Flank Pain and Abdominal Pain . Admitted 07/06/2016 : Day #: 2 days 1. Hypertensive urgency:Offe drip,continue with current medications have when necessary medications available though K transferred for  2. Pyelonephritis:CAT scan negative for nephrolithiasis, continue antibiotics follow culture data 3. Type 2 diabetes non-insulin-requiring hold oral agents and sliding scale coverage 4. Hypothyroidism unspecified: Synthroid   All the records are reviewed and case discussed with Care Management/Social Workerr. Management plans discussed with the patient, family and they are in agreement.  CODE STATUS: full TOTAL TIME TAKING CARE OF THIS PATIENT: 28 minutes.   POSSIBLE D/C IN 1-2DAYS, DEPENDING ON CLINICAL CONDITION.   Chucky Homes,  Karenann Cai.D on 07/08/2016 at 1:53 PM  Between 7am to 6pm - Pager - [phone]  After 6pm: House Pager: - [facility]  Office  [phone]  CC: Primary care physician; Lenard Simmer, MD

## 2016-07-08 NOTE — Progress Notes (Signed)
Pharmacist - Prescriber Communication  Simvastatin 40 mg po daily has been changed to atorvastatin 20 mg po daily to avoid drug-drug interaction with amlodipine.  Taylor Tyler [place], [place].D., BCPS Clinical Pharmacist 07/08/2106 0145

## 2016-07-08 NOTE — Progress Notes (Signed)
Pt has remained alert and oriented with no c/o pain. Lung sounds have remained clear to auscultation, sats <95% on RA . RR even and unlabored. NSR on cardiac monitor. Cardene gtt turned off at 0518 this am. BP has been controlled via PO meds with PRN labetalol x1 for BP >200. Pt with orders to transfer to the floor.

## 2016-07-09 ENCOUNTER — Encounter: Payer: Self-pay | Admitting: *Deleted

## 2016-07-09 LAB — URINE CULTURE: Culture: >=100000 — AB

## 2016-07-09 LAB — GLUCOSE, CAPILLARY
GLUCOSE-CAPILLARY: 116 mg/dL — AB (ref 65–99)
GLUCOSE-CAPILLARY: 124 mg/dL — AB (ref 65–99)
Glucose-Capillary: 90 mg/dL (ref 65–99)
Glucose-Capillary: 96 mg/dL (ref 65–99)

## 2016-07-09 MED ORDER — CIPROFLOXACIN HCL 500 MG PO TABS
750.0000 mg | ORAL_TABLET | Freq: Two times a day (BID) | ORAL | Status: DC
  Administered 2016-07-09 – 2016-07-10 (×3): 750 mg via ORAL
  Filled 2016-07-09 (×3): qty 2

## 2016-07-09 NOTE — Progress Notes (Signed)
[facility] NAME: Taylor Tyler    MRN#:  [id number]  DATE OF BIRTH:  1940-09-13  SUBJECTIVE:  Hospital Day: 3 days Taylor Tyler is a 75 y.o. female presenting with Flank Pain and Abdominal Pain .   Overnight events: no overnight events Interval Events: Still some complaints right flank pain  REVIEW OF SYSTEMS:  CONSTITUTIONAL: No fever, fatigue or weakness.  EYES: No blurred or double vision.  EARS, NOSE, AND THROAT: No tinnitus or ear pain.  RESPIRATORY: No cough, shortness of breath, wheezing or hemoptysis.  CARDIOVASCULAR: No chest pain, orthopnea, edema.  GASTROINTESTINAL: No nausea, vomiting, diarrhea or abdominal pain.  GENITOURINARY: No dysuria, hematuria.  ENDOCRINE: No polyuria, nocturia,  HEMATOLOGY: No anemia, easy bruising or bleeding SKIN: No rash or lesion. MUSCULOSKELETAL: No joint pain or arthritis.   NEUROLOGIC: No tingling, numbness, weakness.  PSYCHIATRY: No anxiety or depression.   DRUG ALLERGIES:   Allergies  Allergen Reactions  . Hydralazine Other (See Comments)    VITALS:  Blood pressure (!) 171/60, pulse 61, temperature 98 F (36.7 C), temperature source Oral, resp. rate 18, height 5\' 3"  (1.6 m), weight 98.9 kg (218 lb 1.6 oz), SpO2 93 %.  PHYSICAL EXAMINATION:  VITAL SIGNS: Vitals:   07/09/16 1253 07/09/16 1501  BP: (!) 182/57 (!) 171/60  Pulse:    Resp:    Temp:     GENERAL:75 y.o.female currently in no acute distress.  HEAD: Normocephalic, atraumatic.  EYES: Pupils equal, round, reactive to light. Extraocular muscles intact. No scleral icterus.  MOUTH: Moist mucosal membrane. Dentition intact. No abscess noted.  EAR, NOSE, THROAT: Clear without exudates. No external lesions.  NECK: Supple. No thyromegaly. No nodules. No JVD.  PULMONARY: Clear to ascultation, without wheeze rails or rhonci. No use of accessory muscles, Good respiratory effort. good air entry bilaterally CHEST:  Nontender to palpation.  CARDIOVASCULAR: S1 and S2. Regular rate and rhythm. No murmurs, rubs, or gallops. No edema. Pedal pulses 2+ bilaterally.  GASTROINTESTINAL: Soft, nontender, nondistended. No masses. Positive bowel sounds. No hepatosplenomegaly.  MUSCULOSKELETAL: No swelling, clubbing, or edema. Range of motion full in all extremities.  NEUROLOGIC: Cranial nerves II through XII are intact. No gross focal neurological deficits. Sensation intact. Reflexes intact.  SKIN: No ulceration, lesions, rashes, or cyanosis. Skin warm and dry. Turgor intact.  PSYCHIATRIC: Mood, affect within normal limits. The patient is awake, alert and oriented x 3. Insight, judgment intact.   Straight leg test negative    LABORATORY PANEL:   CBC  Recent Labs Lab 07/06/16 1208  WBC 6.1  HGB 11.4*  HCT 33.8*  PLT 224   ------------------------------------------------------------------------------------------------------------------  Chemistries   Recent Labs Lab 07/06/16 1208  NA 141  K 4.1  CL 112*  CO2 24  GLUCOSE 96  BUN 30*  CREATININE 1.23*  CALCIUM 9.4   ------------------------------------------------------------------------------------------------------------------  Cardiac Enzymes  Recent Labs Lab 07/07/16 0616  TROPONINI <0.03   ------------------------------------------------------------------------------------------------------------------  RADIOLOGY:  No results found.  EKG:   Orders placed or performed during the hospital encounter of 07/06/16  . EKG 12-Lead  . EKG 12-Lead    ASSESSMENT AND PLAN:   Taylor Tyler is a 75 y.o. female presenting with Flank Pain and Abdominal Pain . Admitted 07/06/2016 : Day #: 3 days 1. Hypertensive urgency:Offe drip,continue with current medications have when necessary medications available 2. Pyelonephritis:CAT scan negative for nephrolithiasis, continue antibiotics follow culture data 3. Type 2 diabetes non-insulin-requiring  hold oral agents  and sliding scale coverage 4. Hypothyroidism unspecified: Synthroid   All the records are reviewed and case discussed with Care Management/Social Workerr. Management plans discussed with the patient, family and they are in agreement.  CODE STATUS: full TOTAL TIME TAKING CARE OF THIS PATIENT: 28 minutes.   POSSIBLE D/C IN 1-2DAYS, DEPENDING ON CLINICAL CONDITION.   Malikye Reppond,  Karenann Cai.D on 07/09/2016 at 3:10 PM  Between 7am to 6pm - Pager - [phone]  After 6pm: House Pager: - [phone]  [facility]  Office  [phone]  CC: Primary care physician; Lenard Simmer, MD

## 2016-07-09 NOTE — Care Management (Signed)
Continues to require prn meds to control blood pressure but is improving

## 2016-07-10 LAB — GLUCOSE, CAPILLARY
Glucose-Capillary: 94 mg/dL (ref 65–99)
Glucose-Capillary: 161 mg/dL — ABNORMAL HIGH (ref 65–99)

## 2016-07-10 MED ORDER — CIPROFLOXACIN HCL 750 MG PO TABS: 750.0000 mg | ORAL_TABLET | Freq: Two times a day (BID) | ORAL | 0 refills | Status: AC

## 2016-07-10 MED ORDER — NITROGLYCERIN 2 % TD OINT
1.0000 [in_us] | TOPICAL_OINTMENT | Freq: Once | TRANSDERMAL | Status: AC
  Administered 2016-07-10: 1 [in_us] via TOPICAL
  Filled 2016-07-10: qty 1

## 2016-07-10 MED ORDER — CLONIDINE HCL 0.1 MG PO TABS
0.3000 mg | ORAL_TABLET | Freq: Two times a day (BID) | ORAL | Status: DC
  Administered 2016-07-10: 0.3 mg via ORAL
  Filled 2016-07-10: qty 3

## 2016-07-10 MED ORDER — ATORVASTATIN CALCIUM 20 MG PO TABS: 20.0000 mg | ORAL_TABLET | Freq: Every day | ORAL | 0 refills | Status: DC

## 2016-07-10 MED ORDER — CLONIDINE HCL 0.2 MG PO TABS: 0.2000 mg | ORAL_TABLET | Freq: Two times a day (BID) | ORAL | 11 refills | Status: DC

## 2016-07-10 MED ORDER — CLONIDINE HCL 0.1 MG PO TABS: 0.2000 mg | ORAL_TABLET | Freq: Two times a day (BID) | ORAL | Status: DC

## 2016-07-10 MED ORDER — OXYCODONE HCL 5 MG PO TABS: 5.0000 mg | ORAL_TABLET | ORAL | 0 refills | Status: DC | PRN

## 2016-07-10 MED ORDER — AMLODIPINE BESYLATE 10 MG PO TABS: 10.0000 mg | ORAL_TABLET | Freq: Every day | ORAL | 0 refills | Status: DC

## 2016-07-10 NOTE — Progress Notes (Signed)
Pt discharged to home via transport.  Discharge paper work and follow-up instructions re viewed with patient.  Pt verbalized understanding of d/c instructions and follow-up information.  PIV d/c'd.  Prescriptions provided to patient.

## 2016-07-10 NOTE — Evaluation (Signed)
Physical Therapy Evaluation Patient Details Name: Taylor Tyler MRN: [id number] DOB: 1940-11-13 Today's Date: 07/10/2016   History of Present Illness  Pt is a 75 y/o F who presented to the ED with c/o chronic R sided flank pain along with intermittent episodes of chest pain.  EKG has revealed the left ventricle hypertrophy subtle T-wave inversions which are nonspecific. UA is abnormal, CAT scan negative for nephrolithiasis, pt receiving antibiotics.  Systolic BP was around 350-093.  Pt's PMH includes lumbar laminectomy.     Clinical Impression  Pt admitted with above diagnosis. Pt currently with functional limitations due to the deficits listed below (see PT Problem List). [name] presents lethargic, suspect like due to medication to decrease BP (per RN).  She demonstrated unsteadiness while ambulating in hallway and reported 10/10 R hip pain that is chronic for the past ~2 months.  BP supine at start of session 136/53.  Standing at bedside 126/90.  Seated after ambulating 156/74.  PTA pt independent with all ADLs and ambulation and denies any falls in the past 6 months.  Pt will benefit from skilled PT to increase their independence and safety with mobility to allow discharge to the venue listed below.      Follow Up Recommendations Outpatient PT (to address balance impairments and R hip if appropriate)    Equipment Recommendations       Recommendations for Other Services       Precautions / Restrictions Precautions Precautions: Fall;Other (comment) Precaution Comments: monitor BP Restrictions Weight Bearing Restrictions: No      Mobility  Bed Mobility Overal bed mobility: Modified Independent                Transfers Overall transfer level: Needs assistance Equipment used: None Transfers: Sit to/from Stand Sit to Stand: Supervision         General transfer comment: Supervision for safety.  Pt denies dizziness.  No instability  noted.  Ambulation/Gait Ambulation/Gait assistance: Min guard Ambulation Distance (Feet): 250 Feet Assistive device: None Gait Pattern/deviations: Step-through pattern;Decreased stance time - right;Decreased weight shift to right;Antalgic;Wide base of support Gait velocity: decreased Gait velocity interpretation: Below normal speed for age/gender General Gait Details: Pt mildly unsteady but no LOB.  Last 80 ft pt reaching out for railing in hallway c/o 10/10 R hip pain which she said is why she came in and has been going on for ~2 months.    Stairs            Wheelchair Mobility    Modified Rankin (Stroke Patients Only)       Balance Overall balance assessment: Needs assistance Sitting-balance support: No upper extremity supported;Feet supported Sitting balance-Leahy Scale: Good     Standing balance support: No upper extremity supported;During functional activity Standing balance-Leahy Scale: Fair                               Pertinent Vitals/Pain Pain Assessment: No/denies pain    Home Living Family/patient expects to be discharged to:: Private residence Living Arrangements: Alone Available Help at Discharge: Family;Available 24 hours/day Type of Home: House Home Access: Stairs to enter Entrance Stairs-Rails: None Entrance Stairs-Number of Steps: 1 Home Layout: One level Home Equipment: Grab bars - tub/shower;Walker - 2 wheels;Cane - quad;Shower seat;Bedside commode      Prior Function Level of Independence: Independent         Comments: Was not using AD PTA.  Pt denies any  falls in the past 6 months.  Pt driving, bathing, dressing, cooking, cleaning independently.       Hand Dominance   Dominant Hand: Right    Extremity/Trunk Assessment   Upper Extremity Assessment: Overall WFL for tasks assessed           Lower Extremity Assessment: Overall WFL for tasks assessed      Cervical / Trunk Assessment: Normal  Communication    Communication: No difficulties  Cognition Arousal/Alertness: Lethargic;Suspect due to medications Behavior During Therapy: [facility] for tasks assessed/performed Overall Cognitive Status: Within Functional Limits for tasks assessed                      General Comments General comments (skin integrity, edema, etc.): BP supine at start of session 136/53.  Standing at bedside 126/90.  Seated after ambulating 156/74.    Exercises General Exercises - Lower Extremity Ankle Circles/Pumps: AROM;Both;10 reps;Seated Long Arc Quad: AROM;Both;10 reps;Seated Hip Flexion/Marching: Both;10 reps;Supine   Assessment/Plan    PT Assessment Patient needs continued PT services  PT Problem List Decreased activity tolerance;Decreased balance;Pain;Decreased knowledge of use of DME;Decreased safety awareness          PT Treatment Interventions DME instruction;Gait training;Stair training;Functional mobility training;Therapeutic activities;Therapeutic exercise;Balance training;Patient/family education;Modalities    PT Goals (Current goals can be found in the Care Plan section)  Acute Rehab PT Goals Patient Stated Goal: decreased pain PT Goal Formulation: With patient Time For Goal Achievement: 07/24/16 Potential to Achieve Goals: Good    Frequency Min 2X/week   Barriers to discharge        Co-evaluation               End of Session Equipment Utilized During Treatment: Gait belt Activity Tolerance: Patient limited by pain Patient left: in chair;with call bell/phone within reach;with chair alarm set Nurse Communication: Mobility status;Other (comment) (BP readings, severity of R hip pain)         Time: 1200-1230 PT Time Calculation (min) (ACUTE ONLY): 30 min   Charges:   PT Evaluation $PT Eval Low Complexity: 1 Procedure PT Treatments $Gait Training: 8-22 mins   PT G Codes:         Collie Siad PT, DPT 07/10/2016, 1:23 PM

## 2016-07-10 NOTE — Discharge Summary (Signed)
[facility] NAME: Taylor Tyler    MR#:  [id number]  DATE OF BIRTH:  1941-08-04  DATE OF ADMISSION:  07/06/2016 ADMITTING PHYSICIAN: Taylor Mango, MD  DATE OF DISCHARGE: 07/10/16  PRIMARY CARE PHYSICIAN: Lenard Simmer, MD    ADMISSION DIAGNOSIS:  Hypertensive urgency [I16.0] Essential hypertension [I10] Urinary tract infection without hematuria, site unspecified [N39.0] Chest pain, unspecified type [R07.9]  DISCHARGE DIAGNOSIS:  Hypertensive urgency UTI - Ecoli  SECONDARY DIAGNOSIS:   Past Medical History:  Diagnosis Date  . Diabetes mellitus without complication ([place])   . Hypertension     HOSPITAL COURSE:  Taylor Tyler  is a 75 y.o. female admitted 07/06/2016 with chief complaint Flank Pain and Abdominal Pain . Please see H&P performed by Taylor Mango, MD for further information. Patient presented with the above symptoms with noted elevated blood pressure. Requiring nicardipine drip on admission. Blood pressure gradually improved. She continued complaints of R flank pain, but no acute findings. Taking minimal pain meds Patient requests contact info of general surgeon  DISCHARGE CONDITIONS:   stable  CONSULTS OBTAINED:    DRUG ALLERGIES:   Allergies  Allergen Reactions  . Hydralazine Other (See Comments)    DISCHARGE MEDICATIONS:   Current Discharge Medication List    START taking these medications   Details  amLODipine (NORVASC) 10 MG tablet Take 1 tablet (10 mg total) by mouth daily. Qty: 30 tablet, Refills: 0    atorvastatin (LIPITOR) 20 MG tablet Take 1 tablet (20 mg total) by mouth daily at 6 PM. Qty: 30 tablet, Refills: 0    ciprofloxacin (CIPRO) 750 MG tablet Take 1 tablet (750 mg total) by mouth 2 (two) times daily. Qty: 10 tablet, Refills: 0    cloNIDine (CATAPRES) 0.2 MG tablet Take 1 tablet (0.2 mg total) by mouth 2 (two) times daily. Qty: 60 tablet, Refills: 11    oxyCODONE (OXY  IR/ROXICODONE) 5 MG immediate release tablet Take 1 tablet (5 mg total) by mouth every 4 (four) hours as needed for moderate pain. Qty: 30 tablet, Refills: 0      CONTINUE these medications which have NOT CHANGED   Details  acetaminophen (TYLENOL) 650 MG CR tablet Take 650 mg by mouth daily as needed for pain.    carvedilol (COREG) 25 MG tablet 25 mg 2 (two) times daily.     ergocalciferol (VITAMIN D2) 50000 UNITS capsule Take 50,000 Units by mouth once a week. On Fridays    levothyroxine (SYNTHROID, LEVOTHROID) 112 MCG tablet Take 112 mcg by mouth daily.     losartan (COZAAR) 100 MG tablet Take 100 mg by mouth daily.     metFORMIN (GLUCOPHAGE) 500 MG tablet Take 500 mg by mouth 2 (two) times daily with a meal.    HYDROcodone-acetaminophen (NORCO/VICODIN) 5-325 MG per tablet Take 1-2 tablets by mouth every 4 (four) hours as needed. Qty: 30 tablet, Refills: 0    ONE TOUCH ULTRA TEST test strip     ONETOUCH DELICA LANCETS FINE MISC       STOP taking these medications     potassium chloride SA (K-DUR,KLOR-CON) 20 MEQ tablet      simvastatin (ZOCOR) 40 MG tablet      lisinopril-hydrochlorothiazide (PRINZIDE,ZESTORETIC) 20-12.5 MG per tablet          DISCHARGE INSTRUCTIONS:   Can call Dr Adonis Huguenin to schedule appointment for Hernia [phone] [place] [address]  Wide-based anterior abdominal wall  hernia containing bowel, but without evidence of incarceration, strangulation or obstruction. This is stable from the prior study. DIET:  Cardiac diet  DISCHARGE CONDITION:  Stable  ACTIVITY:  Activity as tolerated  OXYGEN:  Home Oxygen: No.   Oxygen Delivery: room air  DISCHARGE LOCATION:  home   If you experience worsening of your admission symptoms, develop shortness of breath, life threatening emergency, suicidal or homicidal thoughts you must seek medical attention immediately by calling 911 or calling your MD immediately  if  symptoms less severe.  You Must read complete instructions/literature along with all the possible adverse reactions/side effects for all the Medicines you take and that have been prescribed to you. Take any new Medicines after you have completely understood and accpet all the possible adverse reactions/side effects.   Please note  You were cared for by a hospitalist during your hospital stay. If you have any questions about your discharge medications or the care you received while you were in the hospital after you are discharged, you can call the unit and asked to speak with the hospitalist on call if the hospitalist that took care of you is not available. Once you are discharged, your primary care physician will handle any further medical issues. Please note that NO REFILLS for any discharge medications will be authorized once you are discharged, as it is imperative that you return to your primary care physician (or establish a relationship with a primary care physician if you do not have one) for your aftercare needs so that they can reassess your need for medications and monitor your lab values.    On the day of Discharge:   VITAL SIGNS:  Blood pressure (!) 122/39, pulse (!) 44, temperature 98 F (36.7 C), temperature source Oral, resp. rate 20, height 5\' 3"  (1.6 m), weight 98.9 kg (218 lb 1.6 oz), SpO2 97 %.  I/O:   Intake/Output Summary (Last 24 hours) at 07/10/16 1254 Last data filed at 07/10/16 1004  Gross per 24 hour  Intake              293 ml  Output             1550 ml  Net            -1257 ml    PHYSICAL EXAMINATION:  GENERAL:  75 y.o.-year-old patient lying in the bed with no acute distress.  EYES: Pupils equal, round, reactive to light and accommodation. No scleral icterus. Extraocular muscles intact.  HEENT: Head atraumatic, normocephalic. Oropharynx and nasopharynx clear.  NECK:  Supple, no jugular venous distention. No thyroid enlargement, no tenderness.  LUNGS:  Normal breath sounds bilaterally, no wheezing, rales,rhonchi or crepitation. No use of accessory muscles of respiration.  CARDIOVASCULAR: S1, S2 normal. No murmurs, rubs, or gallops.  ABDOMEN: Soft, non-tender, non-distended. Bowel sounds present. No organomegaly or mass.  EXTREMITIES: No pedal edema, cyanosis, or clubbing.  NEUROLOGIC: Cranial nerves II through XII are intact. Muscle strength 5/5 in all extremities. Sensation intact. Gait not checked.  PSYCHIATRIC: The patient is alert and oriented x 3.  SKIN: No obvious rash, lesion, or ulcer.   DATA REVIEW:   CBC  Recent Labs Lab 07/06/16 1208  WBC 6.1  HGB 11.4*  HCT 33.8*  PLT 224    Chemistries   Recent Labs Lab 07/06/16 1208  NA 141  K 4.1  CL 112*  CO2 24  GLUCOSE 96  BUN 30*  CREATININE 1.23*  CALCIUM 9.4    Cardiac  Enzymes  Recent Labs Lab 07/07/16 0616  TROPONINI <0.03    Microbiology Results  Results for orders placed or performed during the hospital encounter of 07/06/16  Urine culture     Status: Abnormal   Collection Time: 07/06/16  2:14 PM  Result Value Ref Range Status   Specimen Description URINE, RANDOM  Final   Special Requests NONE  Final   Culture >=100,000 COLONIES/mL KLEBSIELLA PNEUMONIAE (A)  Final   Report Status 07/09/2016 FINAL  Final   Organism ID, Bacteria KLEBSIELLA PNEUMONIAE (A)  Final      Susceptibility   Klebsiella pneumoniae - MIC*    AMPICILLIN 16 RESISTANT Resistant     CEFAZOLIN <=4 SENSITIVE Sensitive     CEFTRIAXONE <=1 SENSITIVE Sensitive     CIPROFLOXACIN <=0.25 SENSITIVE Sensitive     GENTAMICIN <=1 SENSITIVE Sensitive     IMIPENEM <=0.25 SENSITIVE Sensitive     NITROFURANTOIN 32 SENSITIVE Sensitive     TRIMETH/SULFA <=20 SENSITIVE Sensitive     AMPICILLIN/SULBACTAM <=2 SENSITIVE Sensitive     PIP/TAZO <=4 SENSITIVE Sensitive     Extended ESBL NEGATIVE Sensitive     * >=100,000 COLONIES/mL KLEBSIELLA PNEUMONIAE  MRSA PCR Screening     Status: Abnormal    Collection Time: 07/06/16  7:25 PM  Result Value Ref Range Status   MRSA by PCR POSITIVE (A) NEGATIVE Final    Comment:        The GeneXpert MRSA Assay (FDA approved for NASAL specimens only), is one component of a comprehensive MRSA colonization surveillance program. It is not intended to diagnose MRSA infection nor to guide or monitor treatment for MRSA infections. RESULT CALLED TO, READ BACK BY AND VERIFIED WITH: BARBARA PHAL 07/06/16 AT 2051 BY HS     RADIOLOGY:  No results found.   Management plans discussed with the patient, family and they are in agreement.  CODE STATUS:     Code Status Orders        Start     Ordered   07/06/16 1909  Full code  Continuous     07/06/16 1908    Code Status History    Date Active Date Inactive Code Status Order ID Comments User Context   This patient has a current code status but no historical code status.    Advance Directive Documentation   [place] Most Recent Value  Type of Advance Directive  Living will  Pre-existing out of facility DNR order (yellow form or pink MOST form)  No data  "MOST" Form in Place?  No data      TOTAL TIME TAKING CARE OF THIS PATIENT: 33 minutes.    Hower,  Karenann Cai.D on 07/10/2016 at 12:54 PM  Between 7am to 6pm - Pager - [phone]  After 6pm go to [email or web address] - Proofreader  [facility]  Office  [phone]  CC: Primary care physician; Lenard Simmer, MD

## 2016-07-10 NOTE — Progress Notes (Signed)
Pt's BP continues to be elevated, vital signs are as follows. Pt does have PRN IV labealol, but cannot give due to HR being 57. Also has PRN IV hydralazine, but pt is allergic, however it was given in the ICU with benadryl, but pt refuses now. MD paged, Dr. Estanislado Pandy to put in one time order for 1inch of nitro paste. Will wait for those orders and give & recheck. Conley Simmonds, RN, BSN  Vitals:   07/10/16 0503 07/10/16 0657  BP: (!) 203/60 (!) 196/64  Pulse: (!) 57 (!) 58  Resp: 18   Temp: 98.2 F (36.8 C)

## 2016-07-23 ENCOUNTER — Encounter (INDEPENDENT_AMBULATORY_CARE_PROVIDER_SITE_OTHER): Payer: Medicare Other | Admitting: Ophthalmology

## 2016-07-23 DIAGNOSIS — H34811 Central retinal vein occlusion, right eye, with macular edema: Secondary | ICD-10-CM | POA: Diagnosis not present

## 2016-07-23 DIAGNOSIS — E113313 Type 2 diabetes mellitus with moderate nonproliferative diabetic retinopathy with macular edema, bilateral: Secondary | ICD-10-CM | POA: Diagnosis not present

## 2016-07-23 DIAGNOSIS — H35033 Hypertensive retinopathy, bilateral: Secondary | ICD-10-CM | POA: Diagnosis not present

## 2016-07-23 DIAGNOSIS — I10 Essential (primary) hypertension: Secondary | ICD-10-CM | POA: Diagnosis not present

## 2016-07-23 DIAGNOSIS — E11311 Type 2 diabetes mellitus with unspecified diabetic retinopathy with macular edema: Secondary | ICD-10-CM

## 2016-07-23 DIAGNOSIS — H43813 Vitreous degeneration, bilateral: Secondary | ICD-10-CM

## 2016-08-08 ENCOUNTER — Other Ambulatory Visit: Payer: Self-pay | Admitting: Neurological Surgery

## 2016-08-08 DIAGNOSIS — M48062 Spinal stenosis, lumbar region with neurogenic claudication: Secondary | ICD-10-CM

## 2016-08-13 ENCOUNTER — Ambulatory Visit
Admission: RE | Admit: 2016-08-13 | Discharge: 2016-08-13 | Disposition: A | Discharge status: Home / Self Care | Payer: Medicare Other | Source: Ambulatory Visit | Attending: Neurological Surgery | Admitting: Neurological Surgery

## 2016-08-13 DIAGNOSIS — M48062 Spinal stenosis, lumbar region with neurogenic claudication: Secondary | ICD-10-CM

## 2016-08-13 MED ORDER — GADOBENATE DIMEGLUMINE 529 MG/ML IV SOLN
20.0000 mL | Freq: Once | INTRAVENOUS | Status: AC | PRN
  Administered 2016-08-13: 20 mL via INTRAVENOUS

## 2016-09-02 ENCOUNTER — Other Ambulatory Visit: Payer: Self-pay

## 2016-09-03 ENCOUNTER — Encounter (INDEPENDENT_AMBULATORY_CARE_PROVIDER_SITE_OTHER): Payer: Medicare Other | Admitting: Ophthalmology

## 2016-09-03 DIAGNOSIS — E11311 Type 2 diabetes mellitus with unspecified diabetic retinopathy with macular edema: Secondary | ICD-10-CM

## 2016-09-03 DIAGNOSIS — E113313 Type 2 diabetes mellitus with moderate nonproliferative diabetic retinopathy with macular edema, bilateral: Secondary | ICD-10-CM | POA: Diagnosis not present

## 2016-09-03 DIAGNOSIS — H34812 Central retinal vein occlusion, left eye, with macular edema: Secondary | ICD-10-CM

## 2016-09-03 DIAGNOSIS — H35033 Hypertensive retinopathy, bilateral: Secondary | ICD-10-CM | POA: Diagnosis not present

## 2016-09-03 DIAGNOSIS — I10 Essential (primary) hypertension: Secondary | ICD-10-CM

## 2016-09-03 DIAGNOSIS — H43813 Vitreous degeneration, bilateral: Secondary | ICD-10-CM | POA: Diagnosis not present

## 2016-09-05 ENCOUNTER — Ambulatory Visit (INDEPENDENT_AMBULATORY_CARE_PROVIDER_SITE_OTHER): Payer: Medicare Other | Admitting: General Surgery

## 2016-09-05 ENCOUNTER — Ambulatory Visit
Admission: RE | Admit: 2016-09-05 | Discharge: 2016-09-05 | Disposition: A | Discharge status: Home / Self Care | Payer: Medicare Other | Source: Ambulatory Visit | Attending: General Surgery | Admitting: General Surgery

## 2016-09-05 ENCOUNTER — Telehealth: Payer: Self-pay

## 2016-09-05 ENCOUNTER — Encounter: Payer: Self-pay | Admitting: General Surgery

## 2016-09-05 VITALS — BP 156/81 | HR 44 | Temp 97.2°F | Ht 63.0 in | Wt 218.6 lb

## 2016-09-05 DIAGNOSIS — R079 Chest pain, unspecified: Secondary | ICD-10-CM | POA: Insufficient documentation

## 2016-09-05 DIAGNOSIS — R1084 Generalized abdominal pain: Secondary | ICD-10-CM

## 2016-09-05 NOTE — Telephone Encounter (Signed)
I spoke with Taylor Tyler at [facility]'s office and patient was seen 09/04/16 and the decision was made to proceed with her back surgery.  I let Taylor Tyler know the patient has an appointment with Dr.Callwood on 09-08-16 if they should need Cardiac Clearance.

## 2016-09-05 NOTE — Patient Instructions (Signed)
You will need to have an EKG done today. We would like for you to see Dr. Ronnald Collum as well for the chest pain you are having. We will call his office on Monday and make an appointment for you and call you to let you know when. We will also refer you back to Dr.Ditty. Someone from their office will contact you to make the appointment.   Directions to Medical Mall: When leaving our office, go right. Go all of the way down to the very end of the hallway. You will have a purple wall in front of you. You will now have a tunnel to the hospital on your left hand side. Go through this tunnel and the elevators will be on your left. Go down to the 1st floor and take a slight left. The very first desk on the right hand side is the registration desk.

## 2016-09-05 NOTE — Progress Notes (Signed)
Outpatient Surgical Follow Up  09/05/2016  Taylor Tyler is an 76 y.o. female.   Chief Complaint  Patient presents with  . Other    Established patient-Ed follow up-Abdominal wall Hernia-    HPI: 76 year old female who is known to the surgery department from surgery by her previous partners returns to clinic to discuss her abdominal pain. She states she has generalized abdominal pain that moved to her right lower quadrant especially with activity. However she then states that she has constant back pain that shoots down to her legs which stops her from being active and prevents her from walking. Patient has been evaluated numerous times as [place] for her hernia repair and been counseled that she is at a high risk for hernia repair until she loses weight down to 190 pounds. She does state she is slowly reaching that goal. She also been evaluated by neurosurgery for potential spinal fusion or some other spine surgery. Patient denies any fevers, chills, nausea, vomiting, chest pain, shortness breath, diarrhea, constipation.  Past Medical History:  Diagnosis Date  . Diabetes mellitus without complication ([place])   . Disease of pancreas   . Hypertension     Past Surgical History:  Procedure Laterality Date  . ABDOMINAL HYSTERECTOMY  1987  . BACK SURGERY  2013  . BREAST SURGERY  1992   Breast Reduction  . CHOLECYSTECTOMY  1982  . COLON SURGERY    . EYE SURGERY  2017  . [place]  . LUMBAR LAMINECTOMY WITH SPINOUS PROCESS PLATE 2 LEVEL N/A 7/41/6384   Procedure: LUMBAR LAMINECTOMY/DECOMPRESSION MICRODISCECTOMY CoFlex;  Surgeon: Faythe Ghee, MD;  Location: [facility];  Service: Neurosurgery;  Laterality: N/A;  Lumbar three-four,Lumbar Four-Five Laminectomy with Coflex    Family History  Problem Relation Age of Onset  . Diabetes Father     Social History:  reports that she has never smoked. She has never used smokeless tobacco. She reports that she  does not drink alcohol or use drugs.  Allergies:  Allergies  Allergen Reactions  . Hydralazine Other (See Comments)    Medications reviewed.    ROS A multipoint review of systems was completed, all pertinent positives and negatives are documented within the history of present illness and remainder are negative.   BP (!) 156/81   Pulse (!) 44   Temp 97.2 F (36.2 C) (Oral)   Ht 5\' 3"  (1.6 m)   Wt 99.2 kg (218 lb 9.6 oz)   BMI 38.72 kg/m   Physical Exam Gen.: No acute distress Neck: Supple and nontender Chest: Clear to auscultation Heart: Regular rhythm Abdomen: Large, soft, nondistended. Well healed midline and right upper quadrant incision sites. Difficult to palpate hernia from adipose. Extremities: Moves all extremities well.    No results found for this or any previous visit (from the past 48 hour(s)). No results found.  Assessment/Plan:  1. Generalized abdominal pain 76 year old female with generalized abdominal pain. Likely secondary to recurrent incisional hernia. Agree with evaluation by [facility] surgeons that she is at a very high risk for recurrence should she have an elective hernia repair prior to weight loss. Had long conversation with the patient about the importance of this and that she should continue to follow up with the surgeons at [facility]. Patient voiced understanding. She will continue follow-up with the neurosurgeon for spine surgery and continue to work on weight loss prior to follow-up with those surgeons.  2. Chest pain, unspecified type During the  course of this visit patient describes intermittent chest pains with activities. This sounds suspicious for angina. I will obtain an EKG today and arrange for the patient to follow-up with her primary care provider next week or the ER pending the result. - EKG 12-Lead; Future  A total of 25 minutes was used to this encounter. 50% of it used for counseling and coordination of care.   Clayburn Pert, MD  [facility] General Surgeon  09/05/2016,11:39 AM

## 2016-09-05 NOTE — Telephone Encounter (Signed)
Stacy-EKG technician alerted Dr.Callwood of patient's EKG. Dr.Callwood spoke with patient 10 minutes and patient was instructed to go home.   Patient has an appointment with Dr.Callwood on 09/08/16 @ 3pm.  Dr.Woodham notified of the above information.

## 2016-09-08 NOTE — Telephone Encounter (Signed)
Spoke with Taylor Tyler at [facility] office. Appointment is scheduled for 09/11/16 @ 10:45 am.  Left message for patient to return my call to let her know of the appointment.

## 2016-09-08 NOTE — Telephone Encounter (Signed)
Spoke with patient at this time regarding her scheduled appointment 09/11/16 @ 10:45 with Dr.Moriyati.  Also reminded patient of her Cardiology appointment with [facility] today at 3 pm.  Patient verbalized understanding.

## 2016-09-12 ENCOUNTER — Other Ambulatory Visit (INDEPENDENT_AMBULATORY_CARE_PROVIDER_SITE_OTHER): Payer: Self-pay | Admitting: Internal Medicine

## 2016-09-12 ENCOUNTER — Other Ambulatory Visit (INDEPENDENT_AMBULATORY_CARE_PROVIDER_SITE_OTHER): Payer: Medicare Other

## 2016-09-12 ENCOUNTER — Encounter (INDEPENDENT_AMBULATORY_CARE_PROVIDER_SITE_OTHER): Payer: Self-pay | Admitting: Vascular Surgery

## 2016-09-12 ENCOUNTER — Ambulatory Visit (INDEPENDENT_AMBULATORY_CARE_PROVIDER_SITE_OTHER): Payer: Medicare Other | Admitting: Vascular Surgery

## 2016-09-12 VITALS — BP 152/82 | HR 53 | Resp 16 | Ht 63.0 in | Wt 217.0 lb

## 2016-09-12 DIAGNOSIS — I701 Atherosclerosis of renal artery: Secondary | ICD-10-CM

## 2016-09-12 DIAGNOSIS — I10 Essential (primary) hypertension: Secondary | ICD-10-CM | POA: Diagnosis not present

## 2016-09-12 DIAGNOSIS — G8929 Other chronic pain: Secondary | ICD-10-CM | POA: Diagnosis not present

## 2016-09-12 DIAGNOSIS — M545 Low back pain, unspecified: Secondary | ICD-10-CM | POA: Insufficient documentation

## 2016-09-12 NOTE — Assessment & Plan Note (Signed)
We had a long discussion today about the pathophysiology and natural history of renal artery stenosis as a form of secondary hypertension. We discussed that most individuals with high blood pressure have essential hypertension without a causative factor. The most common cause of secondary hypertension his renal artery stenosis.Her renal artery duplex demonstrates normal velocities throughout both renal arteries with normal kidney sizes and normal aorta renal ratios bilaterally. Her duplex would demonstrate no hemodynamically significant renal artery stenosis bilaterally. Although renal artery duplex has a small error rate of about 3-5% and no test is 100% diagnostic, her study looks pretty normal without a suggestion of renal artery stenosis. It does not appear as if she has renal artery stenosis as a cause of secondary hypertension. I will defer any further workup to her primary care physician or cardiologist. I will see her back as needed

## 2016-09-12 NOTE — Progress Notes (Signed)
[name], female   DOB: Dec 05, 1940, 76 y.o.   MRN: [id number]  Chief Complaint  Patient presents with  . New Patient (Initial Visit)    HPI [name] is a 76 y.o. female.  I am asked to see the patient by Dr. Clayborn Bigness for evaluation of severe hypertension with renal artery duplex.  The patient reports blood pressures which often run in excess of 782 systolic. She has been dealing with high blood pressure for 20-25 years, but it has become much more difficult control over the past year or 2. There is no clear inciting event or causative factor that created the change. She reports her renal function to be okay as far she knows. She is now on 5 antihypertensives and we went through her medicine list today. She states that she has a lot of pain from back disease and is scheduled to have back surgery and wonders if this is why her blood pressure so high. For evaluation of her renal artery blood flow, duplex was done today. Her renal artery duplex demonstrates normal velocities throughout both renal arteries with normal kidney sizes and normal aorta renal ratios bilaterally. Her duplex would demonstrate no hemodynamically significant renal artery stenosis bilaterally.   Past Medical History:  Diagnosis Date  . Diabetes mellitus without complication (Taylor Tyler)   . Disease of pancreas   . Hypertension     Past Surgical History:  Procedure Laterality Date  . ABDOMINAL HYSTERECTOMY  1987  . BACK SURGERY  2013  . BREAST SURGERY  1992   Breast Reduction  . CHOLECYSTECTOMY  1982  . COLON SURGERY    . EYE SURGERY  2017  . [place]  . LUMBAR LAMINECTOMY WITH SPINOUS PROCESS PLATE 2 LEVEL N/A 9/56/2130   Procedure: LUMBAR LAMINECTOMY/DECOMPRESSION MICRODISCECTOMY CoFlex;  Surgeon: Faythe Ghee, MD;  Location: [facility];  Service: Neurosurgery;  Laterality: N/A;  Lumbar three-four,Lumbar Four-Five Laminectomy with Coflex    Family History  Problem Relation  Age of Onset  . Diabetes Father   . Heart attack Father   . Hypertension Father   . Diabetes Mother   . Hypertension Mother      Social History Social History  Substance Use Topics  . Smoking status: Never Smoker  . Smokeless tobacco: Never Used  . Alcohol use No     Allergies  Allergen Reactions  . Hydralazine Other (See Comments)    Current Outpatient Prescriptions  Medication Sig Dispense Refill  . acetaminophen (TYLENOL) 650 MG CR tablet Take 650 mg by mouth daily as needed for pain.    Marland Kitchen amLODipine (NORVASC) 10 MG tablet     . carvedilol (COREG) 25 MG tablet 25 mg 2 (two) times daily.     . ergocalciferol (VITAMIN D2) 50000 UNITS capsule Take 50,000 Units by mouth once a week. On Fridays    . hydrochlorothiazide (HYDRODIURIL) 25 MG tablet     . KLOR-CON M20 20 MEQ tablet     . levothyroxine (SYNTHROID, LEVOTHROID) 112 MCG tablet Take 112 mcg by mouth daily.     Marland Kitchen losartan (COZAAR) 100 MG tablet Take 100 mg by mouth daily.     . Magnesium Citrate 100 MG TABS Take by mouth.    . metFORMIN (GLUCOPHAGE) 500 MG tablet Take 500 mg by mouth 2 (two) times daily with a meal.    . naproxen sodium (ANAPROX) 220 MG tablet Take 220 mg by mouth 2 (two) times daily  with a meal.    . ONE TOUCH ULTRA TEST test strip     . ONETOUCH DELICA LANCETS FINE MISC     . atorvastatin (LIPITOR) 20 MG tablet Take 1 tablet (20 mg total) by mouth daily at 6 PM. 30 tablet 0  . cloNIDine (CATAPRES) 0.2 MG tablet Take 1 tablet (0.2 mg total) by mouth 2 (two) times daily. 60 tablet 11  . trimethoprim-polymyxin b (POLYTRIM) ophthalmic solution      No current facility-administered medications for this visit.       REVIEW OF SYSTEMS (Negative unless checked)  Constitutional: _0 Weight loss  _1 Fever  _2 Chills Cardiac: _3 Chest pain   _4 Chest pressure   _5 Palpitations   _6 Shortness of breath when laying flat   _7 Shortness of breath at rest   _8 Shortness of breath with exertion. Vascular:  _9 Pain in  legs with walking   _10 Pain in legs at rest   _11 Pain in legs when laying flat   _12 Claudication   _13 Pain in feet when walking  _14 Pain in feet at rest  _15 Pain in feet when laying flat   _16 History of DVT   _17 Phlebitis   _18 Swelling in legs   _19 Varicose veins   _20 Non-healing ulcers Pulmonary:   _21 Uses home oxygen   _22 Productive cough   _23 Hemoptysis   _24 Wheeze  _25 COPD   _26 Asthma Neurologic:  _27 Dizziness  _28 Blackouts   _29 Seizures   _30 History of stroke   _31 History of TIA  _32 Aphasia   _33 Temporary blindness   _34 Dysphagia   _35 Weakness or numbness in arms   _36 Weakness or numbness in legs Musculoskeletal:  _37 Arthritis   _38 Joint swelling   _39 Joint pain   _40 Low back pain Hematologic:  _41 Easy bruising  _42 Easy bleeding   _43 Hypercoagulable state   _44 Anemic  _45 Hepatitis Gastrointestinal:  _46 Blood in stool   _47 Vomiting blood  _48 Gastroesophageal reflux/heartburn   _49 Abdominal pain Genitourinary:  _50 Chronic kidney disease   _51 Difficult urination  _52 Frequent urination  _53 Burning with urination   _54 Hematuria Skin:  _55 Rashes   _56 Ulcers   _57 Wounds Psychological:  _58 History of anxiety   _59  History of major depression.    Physical Exam BP (!) 152/82   Pulse (!) 53   Resp 16   Ht _60  (1.6 m)   Wt 217 lb (98.4 kg)   BMI 38.44 kg/m  Gen:  WD/WN, NAD. Appears younger than stated age Head: [place]/AT, No temporalis wasting. Prominent temp pulse not noted. Ear/Nose/Throat: Hearing grossly intact, nares w/o erythema or drainage, oropharynx w/o Erythema/Exudate Eyes: Conjunctiva clear, sclera non-icteric  Neck: trachea midline.  No JVD.  Pulmonary:  Good air movement, respirations not labored Cardiac: RRR, normal S1, S2 Vascular:  Vessel Right Left  Radial Palpable Palpable                                   Gastrointestinal: soft, non-tender/non-distended.  Musculoskeletal: M/S 5/5 throughout.  Extremities without ischemic changes.  No deformity or atrophy.  Neurologic: Sensation grossly intact in extremities.   Symmetrical.  Speech is fluent. Motor exam as listed above. Psychiatric: Judgment intact, Mood & affect appropriate for pt's clinical situation. Dermatologic: No rashes or ulcers noted.  No cellulitis or open wounds. Lymph : No Cervical, Axillary, or Inguinal lymphadenopathy.   Radiology Mr Lumbar Spine W Wo Contrast  Result Date: 08/13/2016 CLINICAL DATA:  Low back pain radiating to the right hip. Groin pain. EXAM: MRI LUMBAR SPINE WITHOUT AND WITH CONTRAST TECHNIQUE: Multiplanar and  multiecho pulse sequences of the lumbar spine were obtained without and with intravenous contrast. CONTRAST:  77m MULTIHANCE GADOBENATE DIMEGLUMINE 529 MG/ML IV SOLN COMPARISON:  01/12/2015 FINDINGS: Segmentation:  Standard. Alignment:  Physiologic. Vertebrae:  No fracture, evidence of discitis, or bone lesion. Conus medullaris: Extends to the L1 level and appears normal. Paraspinal and other soft tissues: Postsurgical changes in the posterior paraspinal soft tissues. Disc levels: Disc spaces: Degenerative disc disease with disc height loss at L3-4, L4-5 and L5-S1. There are CoFlex devices between the L3-4 and L4-5 spinous processes. T12-L1: No significant disc bulge. No evidence of neural foraminal stenosis. No central canal stenosis. L1-L2: Mild broad-based disc bulge. No evidence of neural foraminal stenosis. Mild spinal stenosis. L2-L3: The mild broad-based disc bulge eccentric towards the left. No evidence of neural foraminal stenosis. No central canal stenosis. L3-L4: Broad-based disc bulge. Moderate bilateral facet arthropathy. Bilateral lateral recess stenosis and moderate spinal stenosis. Moderate bilateral foraminal stenosis. L4-L5: Broad-based disc bulge. Moderate bilateral facet arthropathy. No evidence of neural foraminal stenosis. No central canal stenosis. L5-S1: Broad-based disc bulge with a small central disc protrusion. Left lateral recess stenosis. Moderate bilateral facet arthropathy. No evidence of neural  foraminal stenosis. No central canal stenosis. IMPRESSION: 1. L5-S1 there is a broad-based disc bulge with a small central disc protrusion. Left lateral recess stenosis. Moderate bilateral facet arthropathy. 2. At L3-4 there is a broad-based disc bulge. Moderate bilateral facet arthropathy. Bilateral lateral recess stenosis and moderate spinal stenosis. Moderate bilateral foraminal stenosis. 3.  CoFlex devices between the L3-4 and L4-5 spinous processes. Electronically Signed   By: HKathreen Devoid  On: 08/13/2016 13:40    Labs Recent Results (from the past 2160 hour(s))  CBC     Status: Abnormal   Collection Time: 07/06/16 12:08 PM  Result Value Ref Range   WBC 6.1 3.6 - 11.0 K/uL   RBC 3.78 (L) 3.80 - 5.20 MIL/uL   Hemoglobin 11.4 (L) 12.0 - 16.0 g/dL   HCT 33.8 (L) 35.0 - 47.0 %   MCV 89.5 80.0 - 100.0 fL   MCH 30.0 26.0 - 34.0 pg   MCHC 33.6 32.0 - 36.0 g/dL   RDW 14.1 11.5 - 14.5 %   Platelets 224 150 - 440 K/uL  Basic metabolic panel     Status: Abnormal   Collection Time: 07/06/16 12:08 PM  Result Value Ref Range   Sodium 141 135 - 145 mmol/L   Potassium 4.1 3.5 - 5.1 mmol/L   Chloride 112 (H) 101 - 111 mmol/L   CO2 24 22 - 32 mmol/L   Glucose, Bld 96 65 - 99 mg/dL   BUN 30 (H) 6 - 20 mg/dL   Creatinine, Ser 1.23 (H) 0.44 - 1.00 mg/dL   Calcium 9.4 8.9 - 10.3 mg/dL   GFR calc non Af Amer 42 (L) >60 mL/min   GFR calc Af Amer 49 (L) >60 mL/min    Comment: (NOTE) The eGFR has been calculated using the CKD EPI equation. This calculation has not been validated in all clinical situations. eGFR's persistently <60 mL/min signify possible Chronic Kidney Disease.    Anion gap 5 5 - 15  Urinalysis complete, with microscopic- may I&O cath if menses     Status: Abnormal   Collection Time: 07/06/16  2:14 PM  Result Value Ref Range   Color, Urine YELLOW (A) YELLOW   APPearance HAZY (A) CLEAR   Glucose, UA NEGATIVE NEGATIVE mg/dL   Bilirubin Urine NEGATIVE NEGATIVE  Ketones, ur  NEGATIVE NEGATIVE mg/dL   Specific Gravity, Urine 1.019 1.005 - 1.030   Hgb urine dipstick NEGATIVE NEGATIVE   pH 5.0 5.0 - 8.0   Protein, ur 100 (A) NEGATIVE mg/dL   Nitrite POSITIVE (A) NEGATIVE   Leukocytes, UA 2+ (A) NEGATIVE   RBC / HPF 0-5 0 - 5 RBC/hpf   WBC, UA TOO NUMEROUS TO COUNT 0 - 5 WBC/hpf   Bacteria, UA MANY (A) NONE SEEN   Squamous Epithelial / LPF 0-5 (A) NONE SEEN   Mucous PRESENT    Hyaline Casts, UA PRESENT   Urine culture     Status: Abnormal   Collection Time: 07/06/16  2:14 PM  Result Value Ref Range   Specimen Description URINE, RANDOM    Special Requests NONE    Culture >=100,000 COLONIES/mL KLEBSIELLA PNEUMONIAE (A)    Report Status 07/09/2016 FINAL    Organism ID, Bacteria KLEBSIELLA PNEUMONIAE (A)       Susceptibility   Klebsiella pneumoniae - MIC*    AMPICILLIN 16 RESISTANT Resistant     CEFAZOLIN <=4 SENSITIVE Sensitive     CEFTRIAXONE <=1 SENSITIVE Sensitive     CIPROFLOXACIN <=0.25 SENSITIVE Sensitive     GENTAMICIN <=1 SENSITIVE Sensitive     IMIPENEM <=0.25 SENSITIVE Sensitive     NITROFURANTOIN 32 SENSITIVE Sensitive     TRIMETH/SULFA <=20 SENSITIVE Sensitive     AMPICILLIN/SULBACTAM <=2 SENSITIVE Sensitive     PIP/TAZO <=4 SENSITIVE Sensitive     Extended ESBL NEGATIVE Sensitive     * >=100,000 COLONIES/mL KLEBSIELLA PNEUMONIAE  Troponin I     Status: None   Collection Time: 07/06/16  3:08 PM  Result Value Ref Range   Troponin I <0.03 <0.03 ng/mL  Troponin I (q 6hr x 3)     Status: None   Collection Time: 07/06/16  6:01 PM  Result Value Ref Range   Troponin I <0.03 <0.03 ng/mL  Glucose, capillary     Status: None   Collection Time: 07/06/16  7:18 PM  Result Value Ref Range   Glucose-Capillary 99 65 - 99 mg/dL  MRSA PCR Screening     Status: Abnormal   Collection Time: 07/06/16  7:25 PM  Result Value Ref Range   MRSA by PCR POSITIVE (A) NEGATIVE    Comment:        The GeneXpert MRSA Assay (FDA approved for NASAL  specimens only), is one component of a comprehensive MRSA colonization surveillance program. It is not intended to diagnose MRSA infection nor to guide or monitor treatment for MRSA infections. RESULT CALLED TO, READ BACK BY AND VERIFIED WITH: BARBARA PHAL 07/06/16 AT 2051 BY HS   Glucose, capillary     Status: Abnormal   Collection Time: 07/06/16 10:20 PM  Result Value Ref Range   Glucose-Capillary 148 (H) 65 - 99 mg/dL  Troponin I (q 6hr x 3)     Status: None   Collection Time: 07/06/16 11:29 PM  Result Value Ref Range   Troponin I <0.03 <0.03 ng/mL  Hemoglobin A1c     Status: None   Collection Time: 07/07/16  6:16 AM  Result Value Ref Range   Hgb A1c MFr Bld 5.5 4.8 - 5.6 %    Comment: (NOTE)         Pre-diabetes: 5.7 - 6.4         Diabetes: >6.4         Glycemic control for adults with diabetes: <7.0    Mean  Plasma Glucose 111 mg/dL    Comment: (NOTE) Performed At: [facility] [place], [place] [id number] Lindon Romp MD [id number]   Troponin I (q 6hr x 3)     Status: None   Collection Time: 07/07/16  6:16 AM  Result Value Ref Range   Troponin I <0.03 <0.03 ng/mL  Glucose, capillary     Status: None   Collection Time: 07/07/16  7:26 AM  Result Value Ref Range   Glucose-Capillary 85 65 - 99 mg/dL  Glucose, capillary     Status: None   Collection Time: 07/07/16 11:30 AM  Result Value Ref Range   Glucose-Capillary 93 65 - 99 mg/dL  Glucose, capillary     Status: Abnormal   Collection Time: 07/07/16  4:24 PM  Result Value Ref Range   Glucose-Capillary 103 (H) 65 - 99 mg/dL  Glucose, capillary     Status: None   Collection Time: 07/07/16  9:56 PM  Result Value Ref Range   Glucose-Capillary 88 65 - 99 mg/dL  Glucose, capillary     Status: Abnormal   Collection Time: 07/08/16  7:12 AM  Result Value Ref Range   Glucose-Capillary 118 (H) 65 - 99 mg/dL  Glucose, capillary     Status: Abnormal   Collection Time: 07/08/16 11:39 AM   Result Value Ref Range   Glucose-Capillary 149 (H) 65 - 99 mg/dL  Glucose, capillary     Status: None   Collection Time: 07/08/16  4:36 PM  Result Value Ref Range   Glucose-Capillary 88 65 - 99 mg/dL  Glucose, capillary     Status: Abnormal   Collection Time: 07/08/16  9:16 PM  Result Value Ref Range   Glucose-Capillary 153 (H) 65 - 99 mg/dL   Comment 1 Notify RN    Comment 2 Document in Chart   Glucose, capillary     Status: None   Collection Time: 07/09/16  7:19 AM  Result Value Ref Range   Glucose-Capillary 90 65 - 99 mg/dL   Comment 1 Notify RN    Comment 2 Document in Chart   Glucose, capillary     Status: None   Collection Time: 07/09/16 10:59 AM  Result Value Ref Range   Glucose-Capillary 96 65 - 99 mg/dL   Comment 1 Notify RN    Comment 2 Document in Chart   Glucose, capillary     Status: Abnormal   Collection Time: 07/09/16  4:01 PM  Result Value Ref Range   Glucose-Capillary 116 (H) 65 - 99 mg/dL   Comment 1 Notify RN    Comment 2 Document in Chart   Glucose, capillary     Status: Abnormal   Collection Time: 07/09/16  8:45 PM  Result Value Ref Range   Glucose-Capillary 124 (H) 65 - 99 mg/dL  Glucose, capillary     Status: None   Collection Time: 07/10/16  7:31 AM  Result Value Ref Range   Glucose-Capillary 94 65 - 99 mg/dL  Glucose, capillary     Status: Abnormal   Collection Time: 07/10/16 11:15 AM  Result Value Ref Range   Glucose-Capillary 161 (H) 65 - 99 mg/dL    Assessment/Plan:  Low back pain Patient reports she is scheduled to have back surgery.  Accelerated hypertension We had a long discussion today about the pathophysiology and natural history of renal artery stenosis as a form of secondary hypertension. We discussed that most individuals with high blood pressure have essential hypertension without a causative  factor. The most common cause of secondary hypertension his renal artery stenosis.Her renal artery duplex demonstrates normal velocities  throughout both renal arteries with normal kidney sizes and normal aorta renal ratios bilaterally. Her duplex would demonstrate no hemodynamically significant renal artery stenosis bilaterally. Although renal artery duplex has a small error rate of about 3-5% and no test is 100% diagnostic, her study looks pretty normal without a suggestion of renal artery stenosis. It does not appear as if she has renal artery stenosis as a cause of secondary hypertension. I will defer any further workup to her primary care physician or cardiologist. I will see her back as needed      Leotis Pain 09/12/2016, 10:08 AM   This note was created with Dragon medical transcription system.  Any errors from dictation are unintentional.

## 2016-09-12 NOTE — Assessment & Plan Note (Signed)
Patient reports she is scheduled to have back surgery.

## 2016-09-23 ENCOUNTER — Other Ambulatory Visit: Payer: Self-pay | Admitting: Neurological Surgery

## 2016-09-23 DIAGNOSIS — M4727 Other spondylosis with radiculopathy, lumbosacral region: Secondary | ICD-10-CM

## 2016-09-25 ENCOUNTER — Other Ambulatory Visit: Payer: Self-pay | Admitting: Neurological Surgery

## 2016-09-30 ENCOUNTER — Ambulatory Visit
Admission: RE | Admit: 2016-09-30 | Discharge: 2016-09-30 | Disposition: A | Discharge status: Home / Self Care | Payer: Medicare Other | Source: Ambulatory Visit | Attending: Neurological Surgery | Admitting: Neurological Surgery

## 2016-09-30 DIAGNOSIS — M4727 Other spondylosis with radiculopathy, lumbosacral region: Secondary | ICD-10-CM | POA: Insufficient documentation

## 2016-09-30 DIAGNOSIS — X58XXXA Exposure to other specified factors, initial encounter: Secondary | ICD-10-CM | POA: Insufficient documentation

## 2016-09-30 DIAGNOSIS — M8958 Osteolysis, other site: Secondary | ICD-10-CM | POA: Insufficient documentation

## 2016-09-30 DIAGNOSIS — M48061 Spinal stenosis, lumbar region without neurogenic claudication: Secondary | ICD-10-CM | POA: Diagnosis not present

## 2016-09-30 DIAGNOSIS — S32009A Unspecified fracture of unspecified lumbar vertebra, initial encounter for closed fracture: Secondary | ICD-10-CM | POA: Insufficient documentation

## 2016-10-08 ENCOUNTER — Other Ambulatory Visit: Payer: Self-pay

## 2016-10-09 ENCOUNTER — Telehealth: Payer: Self-pay | Admitting: Vascular Surgery

## 2016-10-09 NOTE — Telephone Encounter (Signed)
-----   Message from Gregery Na, RN sent at 10/07/2016  3:17 PM EST ----- Regarding: OV with [facility] Ms. Evers is scheduled for ALIF L5-S1 with Drs. Scot Dock / Ditty on 10/28/16. Please schedule patient an office visit with Dr. Scot Dock prior to procedure. Also, please remind patient to bring LS spine films to appt.   Thanks, Colletta [place]

## 2016-10-09 NOTE — Telephone Encounter (Signed)
Pt states she will bring LS films and confirmed appt with Ebony Hail, Letter mailed

## 2016-10-15 ENCOUNTER — Encounter: Payer: Self-pay | Admitting: Vascular Surgery

## 2016-10-15 ENCOUNTER — Ambulatory Visit (INDEPENDENT_AMBULATORY_CARE_PROVIDER_SITE_OTHER): Payer: Medicare Other | Admitting: Vascular Surgery

## 2016-10-15 ENCOUNTER — Ambulatory Visit: Payer: Medicare Other | Admitting: Vascular Surgery

## 2016-10-15 ENCOUNTER — Encounter (INDEPENDENT_AMBULATORY_CARE_PROVIDER_SITE_OTHER): Payer: Medicare Other | Admitting: Ophthalmology

## 2016-10-15 VITALS — BP 181/78 | HR 50 | Temp 97.3°F | Resp 18 | Ht 63.0 in | Wt 221.0 lb

## 2016-10-15 DIAGNOSIS — E11311 Type 2 diabetes mellitus with unspecified diabetic retinopathy with macular edema: Secondary | ICD-10-CM

## 2016-10-15 DIAGNOSIS — H35033 Hypertensive retinopathy, bilateral: Secondary | ICD-10-CM | POA: Diagnosis not present

## 2016-10-15 DIAGNOSIS — E113311 Type 2 diabetes mellitus with moderate nonproliferative diabetic retinopathy with macular edema, right eye: Secondary | ICD-10-CM | POA: Diagnosis not present

## 2016-10-15 DIAGNOSIS — E113292 Type 2 diabetes mellitus with mild nonproliferative diabetic retinopathy without macular edema, left eye: Secondary | ICD-10-CM

## 2016-10-15 DIAGNOSIS — M5136 Other intervertebral disc degeneration, lumbar region: Secondary | ICD-10-CM

## 2016-10-15 DIAGNOSIS — H2512 Age-related nuclear cataract, left eye: Secondary | ICD-10-CM

## 2016-10-15 DIAGNOSIS — I10 Essential (primary) hypertension: Secondary | ICD-10-CM

## 2016-10-15 DIAGNOSIS — H43813 Vitreous degeneration, bilateral: Secondary | ICD-10-CM | POA: Diagnosis not present

## 2016-10-15 DIAGNOSIS — H34811 Central retinal vein occlusion, right eye, with macular edema: Secondary | ICD-10-CM

## 2016-10-15 DIAGNOSIS — M5137 Other intervertebral disc degeneration, lumbosacral region: Secondary | ICD-10-CM

## 2016-10-15 NOTE — Progress Notes (Signed)
Patient name: Taylor Tyler MRN: [id number] DOB: October 01, 1940 Sex: female  REASON FOR CONSULT: To evaluate for anterior retroperitoneal exposure of L5-S1. Referred by Dr. Marland Kitchen Ditty.  HPI: Taylor Tyler is a 76 y.o. female, with a long history of back pain. She has had previous back surgeries from a posterior approach at the L3-L4 and L4-L5 level. The patient has failed conservative treatment including epidural injections. She was felt to be a candidate for anterior lumbar interbody fusion at the L5-S1 level by Dr. Cyndy Freeze.  I have reviewed the records that were sent from [facility]. She has been followed with low back pain. This has been chronic for over a year. She has pain that radiates to her hips and thighs bilaterally. She has failed conservative treatment and is felt to be a candidate for anterior lumbar interbody fusion. We have been asked to provide anterior retroperitoneal exposure of L5-S1.  Her risk factors for peripheral vascular disease include diabetes and hypertension. She denies any history of hypercholesterolemia, family history of premature cardiovascular disease or tobacco use.  I do not get any history of claudication, rest pain, or nonhealing ulcers.  Past Medical History:  Diagnosis Date  . Diabetes mellitus without complication ([place])   . Disease of pancreas   . Hypertension     Family History  Problem Relation Age of Onset  . Diabetes Father   . Heart attack Father   . Hypertension Father   . Diabetes Mother   . Hypertension Mother     SOCIAL HISTORY: Social History   Social History  . Marital status: Widowed    Spouse name: N/A  . Number of children: N/A  . Years of education: N/A   Occupational History  . Not on file.   Social History Main Topics  . Smoking status: Never Smoker  . Smokeless tobacco: Never Used  . Alcohol use No  . Drug use: No  . Sexual activity: Not on file   Other Topics Concern  . Not on file    Social History Narrative  . No narrative on file    Allergies  Allergen Reactions  . Hydralazine Other (See Comments)    Current Outpatient Prescriptions  Medication Sig Dispense Refill  . amLODipine (NORVASC) 10 MG tablet     . carvedilol (COREG) 25 MG tablet 25 mg 2 (two) times daily.     . ergocalciferol (VITAMIN D2) 50000 UNITS capsule Take 50,000 Units by mouth once a week. On Fridays    . KLOR-CON M20 20 MEQ tablet     . levothyroxine (SYNTHROID, LEVOTHROID) 112 MCG tablet Take 112 mcg by mouth daily.     Marland Kitchen losartan (COZAAR) 100 MG tablet Take 100 mg by mouth daily.     . Magnesium Citrate 100 MG TABS Take by mouth.    . metFORMIN (GLUCOPHAGE) 500 MG tablet Take 500 mg by mouth 2 (two) times daily with a meal.    . naproxen sodium (ANAPROX) 220 MG tablet Take 220 mg by mouth 2 (two) times daily with a meal.    . ONE TOUCH ULTRA TEST test strip     . ONETOUCH DELICA LANCETS FINE MISC     . acetaminophen (TYLENOL) 650 MG CR tablet Take 650 mg by mouth daily as needed for pain.    Marland Kitchen atorvastatin (LIPITOR) 20 MG tablet Take 1 tablet (20 mg total) by mouth daily at 6 PM. 30 tablet 0  . cloNIDine (CATAPRES) 0.2 MG  tablet Take 1 tablet (0.2 mg total) by mouth 2 (two) times daily. 60 tablet 11  . hydrochlorothiazide (HYDRODIURIL) 25 MG tablet     . trimethoprim-polymyxin b (POLYTRIM) ophthalmic solution      No current facility-administered medications for this visit.     REVIEW OF SYSTEMS:  [X]  denotes positive finding, [ ]  denotes negative finding Cardiac  Comments:  Chest pain or chest pressure:    Shortness of breath upon exertion: X   Short of breath when lying flat:    Irregular heart rhythm:        Vascular    Pain in calf, thigh, or hip brought on by ambulation:    Pain in feet at night that wakes you up from your sleep:     Blood clot in your veins:    Leg swelling:         Pulmonary    Oxygen at home:    Productive cough:     Wheezing:         Neurologic     Sudden weakness in arms or legs:     Sudden numbness in arms or legs:     Sudden onset of difficulty speaking or slurred speech:    Temporary loss of vision in one eye:     Problems with dizziness:         Gastrointestinal    Blood in stool:     Vomited blood:         Genitourinary    Burning when urinating:     Blood in urine:        Psychiatric    Major depression:         Hematologic    Bleeding problems:    Problems with blood clotting too easily:        Skin    Rashes or ulcers:        Constitutional    Fever or chills:      PHYSICAL EXAM: Vitals:   10/15/16 1336 10/15/16 1342  BP: (!) 177/74 (!) 181/78  Pulse: (!) 50   Resp: 18   Temp: 97.3 F (36.3 C)   TempSrc: Oral   SpO2: 97%   Weight: 221 lb (100.2 kg)   Height: 5\' 3"  (1.6 m)   Body mass index is 39.15 kg/m.   GENERAL: The patient is a morbidly obese female, in no acute distress.   Her BMI is 39.  CARDIAC: There is a regular rate and rhythm.  VASCULAR: I do not detect carotid bruits. Because of her obesity, I cannot palpate femoral pulses. She does have palpable dorsalis pedis pulses. I was able to obtain biphasic Doppler signals in the dorsalis pedis and posterior tibial positions bilaterally. She has mild bilateral lower extremity swelling. PULMONARY: There is good air exchange bilaterally without wheezing or rales. ABDOMEN: She has a very large abdomen secondary to her morbid obesity. She has had multiple previous abdominal operations and has significant scar tissue just above the pubis. MUSCULOSKELETAL: There are no major deformities or cyanosis. NEUROLOGIC: No focal weakness or paresthesias are detected. SKIN: There are no ulcers or rashes noted. PSYCHIATRIC: The patient has a normal affect.  DATA:   CT SCAN LUMBAR SPINE: I have reviewed her CT scan of the lumbar spine was done on 09/30/2016. She does have some calcific disease in the infrarenal aorta and also bilateral common iliac  arteries.  MEDICAL ISSUES:  PATIENT IS SCHEDULED FOR ALIF AT THE L5 S1 LEVEL: I  have been asked to provide anterior retroperitoneal exposure of L5-S1 for the proposed ALIF. I have several concerns concerning this patient that put her at increased risk for surgery. First she is morbidly obese with a BMI of 39 and we may not have retractors that are deep enough to allow adequate exposure of L5-S1. In addition, she has significant scar tissue from her previous abdominal surgery at the lower aspect of her abdomen which could potentially interfere with mobilization of the retroperitoneum. She also has some calcium in the infrarenal aorta and bilateral common iliac arteries but I think we would be below that level so I'm less concerned about this. I did discuss these concerns over the phone today with Dr. Cyndy Freeze, hoping that there would be an alternative approach for her spinal surgery. However this is felt to be the best approach and therefore I think it is reasonable to attempt anterior retroperitoneal exposure at this level with the understanding that there is some risk that we would not be able to do this safely, in which case we would have to abort the procedure.   I have discussed the procedure and potential risks, including, but not limited to, bleeding, arterial or venous thrombosis or injury. Given her obesity clearly it she is at increased risk for bleeding complications and DVT. She understands these risks and is willing to proceed. Her surgery is scheduled for 10/28/2016.   [name] [facility] [phone]

## 2016-10-17 NOTE — Pre-Procedure Instructions (Signed)
    DENEAN PAVON  10/17/2016      [facility] [address], [place] - [place] [address] [place] [address] Phone: [phone] Fax: [phone]    Your procedure is scheduled on 10/28/16.  Report to [facility]mitting at 530 A.M.  Call this number if you have problems the morning of surgery:  [phone]   Remember:  Do not eat food or drink liquids after midnight.  Take these medicines the morning of surgery with A SIP OF WATER --norvasc,carvedilol,synthroid,   Do not wear jewelry, make-up or nail polish.  Do not wear lotions, powders, or perfumes, or deoderant.  Do not shave 48 hours prior to surgery.  Men may shave face and neck.  Do not bring valuables to the hospital.  [facility] is not responsible for any belongings or valuables.  Contacts, dentures or bridgework may not be worn into surgery.  Leave your suitcase in the car.  After surgery it may be brought to your room.  For patients admitted to the hospital, discharge time will be determined by your treatment team.  Patients discharged the day of surgery will not be allowed to drive home.   Name and phone number of your driver:   Special instructions:  Do not take any aspirin,anti-inflammatories,vitamins,or herbal supplements 5-7 days prior to surgery.  Please read over the following fact sheets that you were given. MRSA Information

## 2016-10-20 ENCOUNTER — Encounter (HOSPITAL_COMMUNITY)
Admission: RE | Admit: 2016-10-20 | Discharge: 2016-10-20 | Disposition: A | Discharge status: Home / Self Care | Payer: Medicare Other | Source: Ambulatory Visit | Attending: Neurological Surgery | Admitting: Neurological Surgery

## 2016-10-20 ENCOUNTER — Encounter (HOSPITAL_COMMUNITY): Payer: Self-pay

## 2016-10-20 DIAGNOSIS — I10 Essential (primary) hypertension: Secondary | ICD-10-CM | POA: Insufficient documentation

## 2016-10-20 DIAGNOSIS — Z79899 Other long term (current) drug therapy: Secondary | ICD-10-CM | POA: Insufficient documentation

## 2016-10-20 DIAGNOSIS — M545 Low back pain: Secondary | ICD-10-CM | POA: Insufficient documentation

## 2016-10-20 DIAGNOSIS — G8929 Other chronic pain: Secondary | ICD-10-CM | POA: Insufficient documentation

## 2016-10-20 DIAGNOSIS — E119 Type 2 diabetes mellitus without complications: Secondary | ICD-10-CM | POA: Diagnosis not present

## 2016-10-20 DIAGNOSIS — Z01812 Encounter for preprocedural laboratory examination: Secondary | ICD-10-CM | POA: Insufficient documentation

## 2016-10-20 DIAGNOSIS — K869 Disease of pancreas, unspecified: Secondary | ICD-10-CM | POA: Insufficient documentation

## 2016-10-20 HISTORY — DX: Unspecified osteoarthritis, unspecified site: M19.90

## 2016-10-20 LAB — TYPE AND SCREEN
ABO/RH(D): A POS
Antibody Screen: NEGATIVE

## 2016-10-20 LAB — BASIC METABOLIC PANEL
Anion gap: 8 (ref 5–15)
BUN: 46 mg/dL — AB (ref 6–20)
CALCIUM: 9.5 mg/dL (ref 8.9–10.3)
CO2: 23 mmol/L (ref 22–32)
CREATININE: 2.18 mg/dL — AB (ref 0.44–1.00)
Chloride: 104 mmol/L (ref 101–111)
GFR calc Af Amer: 25 mL/min — ABNORMAL LOW (ref >60)
GFR, EST NON AFRICAN AMERICAN: 21 mL/min — AB (ref >60)
GLUCOSE: 103 mg/dL — AB (ref 65–99)
Potassium: 4.4 mmol/L (ref 3.5–5.1)
Sodium: 135 mmol/L (ref 135–145)

## 2016-10-20 LAB — ABO/RH: ABO/RH(D): A POS

## 2016-10-20 LAB — CBC
HEMATOCRIT: 32.1 % — AB (ref 36.0–46.0)
Hemoglobin: 10.1 g/dL — ABNORMAL LOW (ref 12.0–15.0)
MCH: 28.1 pg (ref 26.0–34.0)
MCHC: 31.5 g/dL (ref 30.0–36.0)
MCV: 89.2 fL (ref 78.0–100.0)
PLATELETS: 248 10*3/uL (ref 150–400)
RBC: 3.6 MIL/uL — ABNORMAL LOW (ref 3.87–5.11)
RDW: 14.2 % (ref 11.5–15.5)
WBC: 7.2 10*3/uL (ref 4.0–10.5)

## 2016-10-20 LAB — GLUCOSE, CAPILLARY: GLUCOSE-CAPILLARY: 104 mg/dL — AB (ref 65–99)

## 2016-10-20 LAB — PROTIME-INR
INR: 1.15
Prothrombin Time: 14.8 seconds (ref 11.4–15.2)

## 2016-10-20 LAB — SURGICAL PCR SCREEN
MRSA, PCR: NEGATIVE
Staphylococcus aureus: NEGATIVE

## 2016-10-20 LAB — APTT: aPTT: 30 seconds (ref 24–36)

## 2016-10-20 MED ORDER — CHLORHEXIDINE GLUCONATE CLOTH 2 % EX PADS: 6.0000 | MEDICATED_PAD | Freq: Once | CUTANEOUS | Status: DC

## 2016-10-20 MED ORDER — CHLORHEXIDINE GLUCONATE 4 % EX LIQD: 60.0000 mL | Freq: Once | CUTANEOUS | Status: DC

## 2016-10-21 LAB — HEMOGLOBIN A1C
Hgb A1c MFr Bld: 5.8 % — ABNORMAL HIGH (ref 4.8–5.6)
Mean Plasma Glucose: 120 mg/dL

## 2016-10-23 ENCOUNTER — Encounter (HOSPITAL_COMMUNITY): Payer: Self-pay | Admitting: Emergency Medicine

## 2016-10-23 NOTE — Progress Notes (Signed)
Anesthesia Chart Review:  Pt is  a 76 year old female scheduled for L5-S1 ALIF; L3-5 Anterolateral lumbar interbody fusion with removal of coflex;L3-4, L5-S1 Laminectomies with facetectomies for decompression; L4-5 Laminectomy with Gill procedure; L3 to S1 dorsal internal fixation with Mazor Robot on 10/28/2016 with Cyndy Freeze, MD and [name], MD  PMH includes:  HTN, DM. Never smoker. BMI 42  Medications include: Amlodipine, Lipitor, carvedilol, clonidine, iron, Lasix, levothyroxine, losartan, metformin  BP (!) 168/56   Pulse (!) 56   Temp 36.8 C   Resp 18   Ht 5\' 1"  (1.549 m)   Wt 221 lb 9.6 oz (100.5 kg)   SpO2 97%   BMI 41.87 kg/m    Preoperative labs reviewed.   - HbA1c 5.8, glucose 103.  - Cr 2.18, Cr 46  1 view CXR 07/06/16: no acute findings.   EKG 09/05/16: Marked sinus bradycardia (41 bpm) with Premature supraventricular complexes. ST & T wave abnormality, consider inferolateral ischemia  - PCP is Hipolito Bayley, MD. I notified his office of poor renal function at PAT.  Jiles Garter in his office spoke with Dr. Ronnald Collum who will contact pt to stop metformin, losartan, and lasix, and have renal function rechecked in 1 week.  Per Dr. Ronnald Collum, pt may not proceed with surgery until renal function has improved.   - Cardiologist is Lujean Amel, MD. At first (and only) office visit, 09/08/16, pt was seen for bradycardia.  His note says to stop metoprolol (which pt was never on) and start amlodipine and HCTZ.  Pt has started amlodipine but not HCTZ, and continues to take carvedilol.  HR on EKG was 41.  I spoke with Toris in Dr. Etta Quill office.  They will call pt about medications, bradycardia, and HTN.   I notified Janett Billow in Dr. Hewitt Shorts office that pt will need medical clearance from Dr. Ronnald Collum regarding renal function prior to surgery. Given timeframe for surgery scheduled 10/28/16, surgery will need to be postponed.   Willeen Cass, FNP-BC [facility] Short Stay Surgical  Center/Anesthesiology Phone: [phone] 10/23/2016 4:36 PM

## 2016-10-28 ENCOUNTER — Encounter (HOSPITAL_COMMUNITY): Admission: RE | Payer: Self-pay | Source: Ambulatory Visit

## 2016-10-28 ENCOUNTER — Inpatient Hospital Stay (HOSPITAL_COMMUNITY): Admission: RE | Admit: 2016-10-28 | Payer: Medicare Other | Source: Ambulatory Visit | Admitting: Neurological Surgery

## 2016-10-28 SURGERY — ANTERIOR LUMBAR FUSION 1 LEVEL
Anesthesia: General

## 2016-11-26 ENCOUNTER — Encounter (INDEPENDENT_AMBULATORY_CARE_PROVIDER_SITE_OTHER): Payer: Medicare Other | Admitting: Ophthalmology

## 2016-11-26 DIAGNOSIS — H35033 Hypertensive retinopathy, bilateral: Secondary | ICD-10-CM

## 2016-11-26 DIAGNOSIS — E11311 Type 2 diabetes mellitus with unspecified diabetic retinopathy with macular edema: Secondary | ICD-10-CM | POA: Diagnosis not present

## 2016-11-26 DIAGNOSIS — E113392 Type 2 diabetes mellitus with moderate nonproliferative diabetic retinopathy without macular edema, left eye: Secondary | ICD-10-CM | POA: Diagnosis not present

## 2016-11-26 DIAGNOSIS — I10 Essential (primary) hypertension: Secondary | ICD-10-CM | POA: Diagnosis not present

## 2016-11-26 DIAGNOSIS — H2512 Age-related nuclear cataract, left eye: Secondary | ICD-10-CM | POA: Diagnosis not present

## 2016-11-26 DIAGNOSIS — E113311 Type 2 diabetes mellitus with moderate nonproliferative diabetic retinopathy with macular edema, right eye: Secondary | ICD-10-CM | POA: Diagnosis not present

## 2016-11-26 DIAGNOSIS — H43813 Vitreous degeneration, bilateral: Secondary | ICD-10-CM | POA: Diagnosis not present

## 2016-11-26 DIAGNOSIS — H34811 Central retinal vein occlusion, right eye, with macular edema: Secondary | ICD-10-CM | POA: Diagnosis not present

## 2016-11-28 ENCOUNTER — Other Ambulatory Visit: Payer: Self-pay | Admitting: Neurological Surgery

## 2016-11-28 ENCOUNTER — Telehealth: Payer: Self-pay | Admitting: Vascular Surgery

## 2016-11-28 NOTE — Telephone Encounter (Signed)
Scheduled 5/23 @ 1pm.

## 2016-11-28 NOTE — Telephone Encounter (Signed)
-----   Message from Gregery Na, RN sent at 11/28/2016 12:27 PM EDT ----- Regarding: OV with [facility] Ms. Couse is scheduled for ALIF L5-S1 with Drs. Scot Dock / Ditty on 01/09/17. Please schedule office visit with Dr. Scot Dock at least 2 weeks prior to surgery. Also, please remind patient to bring LS spine films to appt.  Thanks, Colletta [place]

## 2016-12-05 ENCOUNTER — Other Ambulatory Visit: Payer: Self-pay

## 2016-12-16 ENCOUNTER — Encounter: Payer: Self-pay | Admitting: Vascular Surgery

## 2016-12-24 ENCOUNTER — Encounter: Payer: Self-pay | Admitting: Vascular Surgery

## 2016-12-24 ENCOUNTER — Ambulatory Visit (INDEPENDENT_AMBULATORY_CARE_PROVIDER_SITE_OTHER): Payer: Medicare Other | Admitting: Vascular Surgery

## 2016-12-24 VITALS — BP 166/70 | HR 54 | Temp 97.0°F | Resp 18 | Ht 63.0 in | Wt 220.0 lb

## 2016-12-24 DIAGNOSIS — M5137 Other intervertebral disc degeneration, lumbosacral region: Secondary | ICD-10-CM

## 2016-12-24 DIAGNOSIS — M5136 Other intervertebral disc degeneration, lumbar region: Secondary | ICD-10-CM | POA: Diagnosis not present

## 2016-12-24 NOTE — Progress Notes (Signed)
Vitals:   12/24/16 1319  BP: (!) 184/73  Pulse: (!) 54  Resp: 18  Temp: 97 F (36.1 C)  SpO2: 99%  Weight: 220 lb (99.8 kg)  Height: 5\' 3"  (1.6 m)

## 2016-12-24 NOTE — Progress Notes (Signed)
Patient name: Taylor Tyler MRN: [id number] DOB: September 18, 1940 Sex: female   REASON FOR CONSULT:     Evaluate for anterior retroperitonea exposure of L5-S1. Referred by Dr. Cyndy Freeze.  HPI:   Taylor Tyler is a 76 y.o. female, Who I had previously seen in consultation in March of this year. We were considering anterior retroperitoneal exposure of L5-S1. My main concern was that based on her CT scan of the lumbar spine, she did have calcific disease in the infrarenal aorta and also bilateral common iliac arteries. I was also concerned given her morbid obesity with a BMI of 39. She also had significant scar tissue from previous abdominal surgery at the lower aspect of her abdomen which could potentially interfere with mobilization of her retroperitoneum. I discussed these concerns with Dr. Cyndy Freeze and he felt that this was the best approach to her problem and we agreed that it would be reasonable to attempt an anterior retroperitoneal exposure at this level with the understanding that there is some risk that we would not be able to do this safely in which case we would have to abort the procedure. Her surgery was postponed because of medical issues. She returns to discuss this further.  I have reviewed the records it was sent with the patient. The patient has had a long history of back pain. The pain is mostly on the right side and she has associated radiculopathy. This has been a chronic problem for over a year. The patient had a CT of the lumbar spine on 09/30/2016. This showed multifactorial spinal stenosis at L3-L4, L4-L5, and L5-S1.  Past Medical History:  Diagnosis Date  . Arthritis   . Diabetes mellitus without complication ([place])   . Disease of pancreas   . Hypertension     Family History  Problem Relation Age of Onset  . Diabetes Father   . Heart attack Father   . Hypertension Father   . Diabetes Mother   . Hypertension Mother     SOCIAL HISTORY: Social History   Social History  .  Marital status: Widowed    Spouse name: N/A  . Number of children: N/A  . Years of education: N/A   Occupational History  . Not on file.   Social History Main Topics  . Smoking status: Never Smoker  . Smokeless tobacco: Never Used  . Alcohol use No  . Drug use: No  . Sexual activity: Not on file   Other Topics Concern  . Not on file   Social History Narrative  . No narrative on file    Allergies  Allergen Reactions  . Hydralazine Other (See Comments)    Chest tightness     Current Outpatient Prescriptions  Medication Sig Dispense Refill  . atorvastatin (LIPITOR) 40 MG tablet Take 40 mg by mouth every evening.    . carvedilol (COREG) 25 MG tablet Take 25 mg by mouth 2 (two) times daily.     . ergocalciferol (VITAMIN D2) 50000 UNITS capsule Take 50,000 Units by mouth every Tuesday.     . ferrous sulfate 325 (65 FE) MG tablet Take 325 mg by mouth daily with breakfast.    . furosemide (LASIX) 20 MG tablet Take 20 mg by mouth daily.    . hydrochlorothiazide (HYDRODIURIL) 25 MG tablet     . levothyroxine (SYNTHROID, LEVOTHROID) 112 MCG tablet Take 112 mcg by mouth daily before breakfast.     . losartan (COZAAR) 100 MG tablet Take 100 mg by  mouth daily after breakfast.     . Magnesium 100 MG TABS Take 150 mg by mouth daily.    . metFORMIN (GLUCOPHAGE) 500 MG tablet Take 500 mg by mouth 2 (two) times daily.     . Naproxen Sod-Diphenhydramine (ALEVE PM) 220-25 MG TABS Take 1 tablet by mouth at bedtime.    . naproxen sodium (ANAPROX) 220 MG tablet Take 220 mg by mouth daily.     . ONE TOUCH ULTRA TEST test strip 1 each by Other route daily.     Glory Rosebush DELICA LANCETS FINE MISC 1 Stick by Other route daily.     Marland Kitchen Propylene Glycol-Glycerin (SOOTHE) 0.6-0.6 % SOLN Place 1 drop into both eyes 3 (three) times daily as needed (for dry/gritty eyes.).    Marland Kitchen amLODipine (NORVASC) 5 MG tablet Take 5 mg by mouth daily.    Marland Kitchen atorvastatin (LIPITOR) 20 MG tablet Take 1 tablet (20 mg total) by  mouth daily at 6 PM. (Patient not taking: Reported on 10/15/2016) 30 tablet 0  . cloNIDine (CATAPRES) 0.2 MG tablet Take 1 tablet (0.2 mg total) by mouth 2 (two) times daily. (Patient not taking: Reported on 12/24/2016) 60 tablet 11   No current facility-administered medications for this visit.     REVIEW OF SYSTEMS:  [X]  denotes positive finding, [ ]  denotes negative finding Cardiac  Comments:  Chest pain or chest pressure:    Shortness of breath upon exertion: X   Short of breath when lying flat:    Irregular heart rhythm:        Vascular    Pain in calf, thigh, or hip brought on by ambulation: X   Pain in feet at night that wakes you up from your sleep:     Blood clot in your veins:    Leg swelling:         Pulmonary    Oxygen at home:    Productive cough:     Wheezing:         Neurologic    Sudden weakness in arms or legs:     Sudden numbness in arms or legs:     Sudden onset of difficulty speaking or slurred speech:    Temporary loss of vision in one eye:     Problems with dizziness:         Gastrointestinal    Blood in stool:     Vomited blood:         Genitourinary    Burning when urinating:     Blood in urine:        Psychiatric    Major depression:         Hematologic    Bleeding problems:    Problems with blood clotting too easily:        Skin    Rashes or ulcers:        Constitutional    Fever or chills:     PHYSICAL EXAM:   Vitals:   12/24/16 1319 12/24/16 1327  BP: (!) 184/73 (!) 166/70  Pulse: (!) 54 (!) 54  Resp: 18   Temp: 97 F (36.1 C)   SpO2: 99%   Weight: 220 lb (99.8 kg)   Height: 5\' 3"  (1.6 m)   Body mass index is 38.97 kg/m.   GENERAL: The patient is a well-nourished female, in no acute distress. The vital signs are documented above. CARDIAC: There is a regular rate and rhythm.  VASCULAR: I do not detect carotid bruits.  She does have palpable dorsalis pedis pulses. She has biphasic Doppler signals in both feet. PULMONARY:  There is good air exchange bilaterally without wheezing or rales. ABDOMEN: She has a large pannus which hangs over her groin. She has multiple scars from previous abdominal surgery. MUSCULOSKELETAL: There are no major deformities or cyanosis. NEUROLOGIC: No focal weakness or paresthesias are detected. SKIN: There are no ulcers or rashes noted. PSYCHIATRIC: The patient has a normal affect.  DATA:    No new data.  MEDICAL ISSUES:   DEGENERATIVE DISC DISEASE L5-S1: The patient has been rescheduled for surgery on 01/15/2017. I will attempt to provide anterior retroperitoneal exposure of L5-S1. I have multiple concerns as noted in my previous note. In order to make the incision in the appropriate location I believe that we will have to tape her pannus superiorly. She certainly will be at high risk for wound infection given her diabetes and morbid obesity. Her BMI is 39. Certainly the depths of the wound will make the procedure technically challenging. Hopefully her multiple previous abdominal procedures will also not make the retroperitoneal dissection more difficult. She does have some calcium in her aorta and common iliac arteries however I think that we will below that level.  I have again discussed with her the indications for the procedure and the potential complications, including but not limited to bleeding, arterial or venous injury, thrombosis, and wound healing problems. All of her questions were answered and she is agreeable to proceed.  [name] [facility] [place] [phone]

## 2017-01-01 ENCOUNTER — Encounter (HOSPITAL_COMMUNITY): Payer: Self-pay | Admitting: Certified Registered"

## 2017-01-01 ENCOUNTER — Encounter (HOSPITAL_COMMUNITY): Payer: Self-pay

## 2017-01-01 ENCOUNTER — Encounter (HOSPITAL_COMMUNITY)
Admission: RE | Admit: 2017-01-01 | Discharge: 2017-01-01 | Disposition: A | Discharge status: Home / Self Care | Payer: Medicare Other | Source: Ambulatory Visit | Attending: Neurological Surgery | Admitting: Neurological Surgery

## 2017-01-01 ENCOUNTER — Encounter (HOSPITAL_COMMUNITY): Payer: Self-pay | Admitting: Emergency Medicine

## 2017-01-01 DIAGNOSIS — E1122 Type 2 diabetes mellitus with diabetic chronic kidney disease: Secondary | ICD-10-CM | POA: Insufficient documentation

## 2017-01-01 DIAGNOSIS — K869 Disease of pancreas, unspecified: Secondary | ICD-10-CM | POA: Diagnosis not present

## 2017-01-01 DIAGNOSIS — I129 Hypertensive chronic kidney disease with stage 1 through stage 4 chronic kidney disease, or unspecified chronic kidney disease: Secondary | ICD-10-CM | POA: Insufficient documentation

## 2017-01-01 DIAGNOSIS — N189 Chronic kidney disease, unspecified: Secondary | ICD-10-CM | POA: Diagnosis not present

## 2017-01-01 DIAGNOSIS — Z01812 Encounter for preprocedural laboratory examination: Secondary | ICD-10-CM | POA: Insufficient documentation

## 2017-01-01 DIAGNOSIS — Z7984 Long term (current) use of oral hypoglycemic drugs: Secondary | ICD-10-CM | POA: Insufficient documentation

## 2017-01-01 HISTORY — DX: Chronic kidney disease, unspecified: N18.9

## 2017-01-01 LAB — CBC
HEMATOCRIT: 31.6 % — AB (ref 36.0–46.0)
HEMOGLOBIN: 9.8 g/dL — AB (ref 12.0–15.0)
MCH: 27.6 pg (ref 26.0–34.0)
MCHC: 31 g/dL (ref 30.0–36.0)
MCV: 89 fL (ref 78.0–100.0)
Platelets: 246 10*3/uL (ref 150–400)
RBC: 3.55 MIL/uL — AB (ref 3.87–5.11)
RDW: 13.4 % (ref 11.5–15.5)
WBC: 6 10*3/uL (ref 4.0–10.5)

## 2017-01-01 LAB — BASIC METABOLIC PANEL
ANION GAP: 7 (ref 5–15)
BUN: 25 mg/dL — ABNORMAL HIGH (ref 6–20)
CHLORIDE: 112 mmol/L — AB (ref 101–111)
CO2: 22 mmol/L (ref 22–32)
CREATININE: 1.37 mg/dL — AB (ref 0.44–1.00)
Calcium: 9.1 mg/dL (ref 8.9–10.3)
GFR calc non Af Amer: 37 mL/min — ABNORMAL LOW (ref >60)
GFR, EST AFRICAN AMERICAN: 43 mL/min — AB (ref >60)
Glucose, Bld: 98 mg/dL (ref 65–99)
Potassium: 3.9 mmol/L (ref 3.5–5.1)
SODIUM: 141 mmol/L (ref 135–145)

## 2017-01-01 LAB — TYPE AND SCREEN
ABO/RH(D): A POS
ANTIBODY SCREEN: NEGATIVE

## 2017-01-01 LAB — SURGICAL PCR SCREEN
MRSA, PCR: POSITIVE — AB
STAPHYLOCOCCUS AUREUS: POSITIVE — AB

## 2017-01-01 LAB — GLUCOSE, CAPILLARY: Glucose-Capillary: 90 mg/dL (ref 65–99)

## 2017-01-01 MED ORDER — CHLORHEXIDINE GLUCONATE CLOTH 2 % EX PADS: 6.0000 | MEDICATED_PAD | Freq: Once | CUTANEOUS | Status: DC

## 2017-01-01 NOTE — Progress Notes (Signed)
PCP is Dr. Hipolito Bayley - surgical clearance note inside chart. Cardio is Dr. Lujean Amel  LOV 09/2016 Last A1C was obtained 10/18/2016  5.8 She states that AM sugars run 80 - 120. Initial blood pressure was 197/57, on second time 207/57 hr 44  Will call her PCP.

## 2017-01-01 NOTE — Pre-Procedure Instructions (Signed)
    ROENA SASSAMAN  01/01/2017      [place] [address] [place] - [place] [address] [place] [address] Phone: [phone] Fax: [phone]    Your procedure is scheduled on June 14th, Thursday   Report to [facility] Admitting at 5:30 AM             (posted surgery time 7:30am - 6:22pm) .  Call this number if you have problems the morning of surgery:  [phone], other questions call [phone] Mon-Fri from 8-4:30pm   Remember:  Do not eat food or drink liquids after midnight Wednesday.   Take these medicines the morning of surgery with A SIP OF WATER : Coreg, Levothyroxine.              4-5 days prior to surgery, STOP TAKING any Vitamins, Herbal Supplements, Anti-inflammatories.              DO NOT TAKE THE MORNING OF SURGERY, any Diabetes medication   Do not wear jewelry, make-up or nail polish.  Do not wear lotions, powders,  perfumes, or deoderant.  Do not shave 48 hours prior to surgery.     Do not bring valuables to the hospital.  [facility] is not responsible for any belongings or valuables.  Contacts, dentures or bridgework may not be worn into surgery.  Leave your suitcase in the car.  After surgery it may be brought to your room.  For patients admitted to the hospital, discharge time will be determined by your treatment team.   Please read over the following fact sheets that you were given. Pain Booklet, MRSA Information and Surgical Site Infection Prevention

## 2017-01-02 ENCOUNTER — Encounter (INDEPENDENT_AMBULATORY_CARE_PROVIDER_SITE_OTHER): Payer: Medicare Other | Admitting: Ophthalmology

## 2017-01-02 DIAGNOSIS — I10 Essential (primary) hypertension: Secondary | ICD-10-CM

## 2017-01-02 DIAGNOSIS — E113392 Type 2 diabetes mellitus with moderate nonproliferative diabetic retinopathy without macular edema, left eye: Secondary | ICD-10-CM | POA: Diagnosis not present

## 2017-01-02 DIAGNOSIS — H35033 Hypertensive retinopathy, bilateral: Secondary | ICD-10-CM

## 2017-01-02 DIAGNOSIS — H43813 Vitreous degeneration, bilateral: Secondary | ICD-10-CM | POA: Diagnosis not present

## 2017-01-02 DIAGNOSIS — E11311 Type 2 diabetes mellitus with unspecified diabetic retinopathy with macular edema: Secondary | ICD-10-CM | POA: Diagnosis not present

## 2017-01-02 DIAGNOSIS — H34811 Central retinal vein occlusion, right eye, with macular edema: Secondary | ICD-10-CM | POA: Diagnosis not present

## 2017-01-02 NOTE — Progress Notes (Addendum)
Anesthesia Chart Review:  Pt is  a 76 year old female scheduled for L5-S1 ALIF; L3-5 Anterolateral lumbar interbody fusion with removal of coflex;L3-4, L5-S1 Laminectomies with facetectomies for decompression; L4-5 Laminectomy with Gill procedure; L3 to S1 dorsal internal fixation with Mazor Robot on 01/15/2017 with Cyndy Freeze, MD and [name], MD  Surgery was originally scheduled for surgery 10/28/16, but it was postponed due to need for evaluation of poor renal function.   -  PCP is Hipolito Bayley, MD who has cleared pt for surgery.  - Cardiologist is Lujean Amel, MD  PMH includes:  HTN, DM, CKD, disease of pancreas. Never smoker. BMI 42  Medications include: Lipitor, carvedilol, iron, HCTZ, levothyroxine, losartan, metformin  BP 197/55 HR 51, on recheck BP was 207/57 HR 44.  Pt has f/u with PCP 01/05/17 about increased systolic BP and low HR.   Preoperative labs reviewed.   - Glucose 98. HbA1c was 5.8 on 10/20/16.  - Renal function improved from 10/20/16. Cr now 1.37 (was 2.18), BUN 25.   1 view CXR 07/06/16: no acute findings.   EKG 09/05/16: Marked sinus bradycardia (41 bpm) with Premature supraventricular complexes. ST & T wave abnormality, consider inferolateral ischemia. Appears stable when compared to EKGs dating back to 03/01/13.   Will revisit chart after pt sees PCP for increased systolic BP.   Willeen Cass, FNP-BC [facility] Short Stay Surgical Center/Anesthesiology Phone: [phone] 01/02/2017 12:16 PM  Addendum:   Pt saw PCP 01/05/17 and 01/08/17 for f/u HTN.  BP initially 208/50.  Pt had not been taking clonidine as prescribed as her insurance would not cover the brand name drug catapres.  Dr. Ronnald Collum put pt on generic clonidine 6/4, rechecked BP 6/7 and found it to to be 128/50.    If no changes, I anticipate pt can proceed with surgery as scheduled.   Willeen Cass, FNP-BC [facility] Short Stay Surgical Center/Anesthesiology Phone: [phone] 01/12/2017 3:50  PM

## 2017-01-08 NOTE — Progress Notes (Signed)
Spoke with Janett Billow at [facility] office to see if they will  Fax over last office visit 01-05-17. States that she will fax it over.

## 2017-01-09 ENCOUNTER — Encounter (INDEPENDENT_AMBULATORY_CARE_PROVIDER_SITE_OTHER): Payer: Self-pay | Admitting: Ophthalmology

## 2017-01-15 ENCOUNTER — Observation Stay (HOSPITAL_COMMUNITY)
Admission: RE | Admit: 2017-01-15 | Discharge: 2017-01-16 | Disposition: A | Discharge status: Home / Self Care | Payer: Medicare Other | Source: Ambulatory Visit | Attending: Internal Medicine | Admitting: Internal Medicine

## 2017-01-15 ENCOUNTER — Encounter (HOSPITAL_COMMUNITY)
Admission: RE | Disposition: A | Discharge status: Home / Self Care | Payer: Self-pay | Source: Ambulatory Visit | Attending: Family Medicine

## 2017-01-15 ENCOUNTER — Encounter (HOSPITAL_COMMUNITY): Payer: Self-pay | Admitting: *Deleted

## 2017-01-15 DIAGNOSIS — N183 Chronic kidney disease, stage 3 unspecified: Secondary | ICD-10-CM | POA: Diagnosis present

## 2017-01-15 DIAGNOSIS — Z888 Allergy status to other drugs, medicaments and biological substances status: Secondary | ICD-10-CM | POA: Diagnosis not present

## 2017-01-15 DIAGNOSIS — K5790 Diverticulosis of intestine, part unspecified, without perforation or abscess without bleeding: Secondary | ICD-10-CM | POA: Diagnosis present

## 2017-01-15 DIAGNOSIS — M48061 Spinal stenosis, lumbar region without neurogenic claudication: Secondary | ICD-10-CM

## 2017-01-15 DIAGNOSIS — Z7984 Long term (current) use of oral hypoglycemic drugs: Secondary | ICD-10-CM | POA: Diagnosis not present

## 2017-01-15 DIAGNOSIS — M4727 Other spondylosis with radiculopathy, lumbosacral region: Principal | ICD-10-CM | POA: Insufficient documentation

## 2017-01-15 DIAGNOSIS — E039 Hypothyroidism, unspecified: Secondary | ICD-10-CM | POA: Diagnosis present

## 2017-01-15 DIAGNOSIS — Z6841 Body Mass Index (BMI) 40.0 and over, adult: Secondary | ICD-10-CM | POA: Diagnosis not present

## 2017-01-15 DIAGNOSIS — E119 Type 2 diabetes mellitus without complications: Secondary | ICD-10-CM | POA: Diagnosis not present

## 2017-01-15 DIAGNOSIS — E785 Hyperlipidemia, unspecified: Secondary | ICD-10-CM | POA: Diagnosis not present

## 2017-01-15 DIAGNOSIS — I161 Hypertensive emergency: Secondary | ICD-10-CM | POA: Diagnosis not present

## 2017-01-15 DIAGNOSIS — Z5309 Procedure and treatment not carried out because of other contraindication: Secondary | ICD-10-CM | POA: Diagnosis not present

## 2017-01-15 DIAGNOSIS — E1122 Type 2 diabetes mellitus with diabetic chronic kidney disease: Secondary | ICD-10-CM | POA: Insufficient documentation

## 2017-01-15 DIAGNOSIS — Z791 Long term (current) use of non-steroidal anti-inflammatories (NSAID): Secondary | ICD-10-CM | POA: Insufficient documentation

## 2017-01-15 DIAGNOSIS — I129 Hypertensive chronic kidney disease with stage 1 through stage 4 chronic kidney disease, or unspecified chronic kidney disease: Secondary | ICD-10-CM | POA: Insufficient documentation

## 2017-01-15 HISTORY — DX: Chronic kidney disease, stage 3 unspecified: N18.30

## 2017-01-15 HISTORY — DX: Diverticulosis of intestine, part unspecified, without perforation or abscess without bleeding: K57.90

## 2017-01-15 HISTORY — DX: Spinal stenosis, lumbar region without neurogenic claudication: M48.061

## 2017-01-15 LAB — MRSA PCR SCREENING: MRSA by PCR: NEGATIVE

## 2017-01-15 LAB — GLUCOSE, CAPILLARY
Glucose-Capillary: 109 mg/dL — ABNORMAL HIGH (ref 65–99)
Glucose-Capillary: 145 mg/dL — ABNORMAL HIGH (ref 65–99)
Glucose-Capillary: 107 mg/dL — ABNORMAL HIGH (ref 65–99)

## 2017-01-15 LAB — MAGNESIUM: Magnesium: 1.6 mg/dL — ABNORMAL LOW (ref 1.7–2.4)

## 2017-01-15 LAB — PHOSPHORUS: Phosphorus: 3.4 mg/dL (ref 2.5–4.6)

## 2017-01-15 SURGERY — ANTERIOR LUMBAR FUSION 1 LEVEL
Anesthesia: General

## 2017-01-15 MED ORDER — LIDOCAINE 2% (20 MG/ML) 5 ML SYRINGE
INTRAMUSCULAR | Status: AC
  Filled 2017-01-15: qty 5

## 2017-01-15 MED ORDER — CLONIDINE HCL 0.2 MG PO TABS
0.2000 mg | ORAL_TABLET | Freq: Two times a day (BID) | ORAL | Status: DC
  Administered 2017-01-15 – 2017-01-16 (×2): 0.2 mg via ORAL
  Filled 2017-01-15 (×2): qty 1

## 2017-01-15 MED ORDER — HYDRALAZINE HCL 20 MG/ML IJ SOLN
5.0000 mg | INTRAMUSCULAR | Status: DC | PRN
  Administered 2017-01-15 (×2): 5 mg via INTRAVENOUS
  Administered 2017-01-16: 10 mg via INTRAVENOUS
  Filled 2017-01-15 (×3): qty 1

## 2017-01-15 MED ORDER — INSULIN ASPART 100 UNIT/ML ~~LOC~~ SOLN: 0.0000 [IU] | Freq: Every day | SUBCUTANEOUS | Status: DC

## 2017-01-15 MED ORDER — INSULIN ASPART 100 UNIT/ML ~~LOC~~ SOLN: 0.0000 [IU] | SUBCUTANEOUS | Status: DC

## 2017-01-15 MED ORDER — LOSARTAN POTASSIUM 50 MG PO TABS
100.0000 mg | ORAL_TABLET | Freq: Every day | ORAL | Status: DC
  Administered 2017-01-16: 100 mg via ORAL
  Filled 2017-01-15: qty 2

## 2017-01-15 MED ORDER — METHYLPREDNISOLONE ACETATE 80 MG/ML IJ SUSP
INTRAMUSCULAR | Status: AC
Start: 2017-01-15 — End: 2017-01-15
  Filled 2017-01-15: qty 1

## 2017-01-15 MED ORDER — PROPOFOL 10 MG/ML IV BOLUS
INTRAVENOUS | Status: AC
  Filled 2017-01-15: qty 20

## 2017-01-15 MED ORDER — HYDRALAZINE HCL 20 MG/ML IJ SOLN: 20.0000 mg | Freq: Once | INTRAMUSCULAR | Status: DC

## 2017-01-15 MED ORDER — SODIUM CHLORIDE 0.9 % IJ SOLN
INTRAMUSCULAR | Status: AC
  Filled 2017-01-15: qty 10

## 2017-01-15 MED ORDER — MORPHINE SULFATE (PF) 4 MG/ML IV SOLN: 2.0000 mg | INTRAVENOUS | Status: AC

## 2017-01-15 MED ORDER — ACETAMINOPHEN 650 MG RE SUPP
650.0000 mg | Freq: Four times a day (QID) | RECTAL | Status: DC | PRN
Start: 2017-01-15 — End: 2017-01-16

## 2017-01-15 MED ORDER — SUFENTANIL CITRATE 50 MCG/ML IV SOLN
INTRAVENOUS | Status: AC
  Filled 2017-01-15: qty 1

## 2017-01-15 MED ORDER — BUPIVACAINE HCL (PF) 0.25 % IJ SOLN
INTRAMUSCULAR | Status: AC
  Filled 2017-01-15: qty 30

## 2017-01-15 MED ORDER — ONDANSETRON HCL 4 MG/2ML IJ SOLN: 4.0000 mg | Freq: Four times a day (QID) | INTRAMUSCULAR | Status: DC | PRN

## 2017-01-15 MED ORDER — CLONIDINE HCL 0.1 MG PO TABS
0.2000 mg | ORAL_TABLET | ORAL | Status: AC
  Administered 2017-01-15: 0.2 mg via ORAL
  Filled 2017-01-15: qty 2

## 2017-01-15 MED ORDER — ROCURONIUM BROMIDE 10 MG/ML (PF) SYRINGE
PREFILLED_SYRINGE | INTRAVENOUS | Status: AC
  Filled 2017-01-15: qty 5

## 2017-01-15 MED ORDER — SODIUM CHLORIDE 0.9% FLUSH
3.0000 mL | Freq: Two times a day (BID) | INTRAVENOUS | Status: DC
  Administered 2017-01-15 – 2017-01-16 (×2): 3 mL via INTRAVENOUS

## 2017-01-15 MED ORDER — CHLORHEXIDINE GLUCONATE 4 % EX LIQD: 60.0000 mL | Freq: Once | CUTANEOUS | Status: DC

## 2017-01-15 MED ORDER — EPHEDRINE 5 MG/ML INJ
INTRAVENOUS | Status: AC
  Filled 2017-01-15: qty 10

## 2017-01-15 MED ORDER — CHLORHEXIDINE GLUCONATE 4 % EX LIQD
60.0000 mL | Freq: Once | CUTANEOUS | Status: AC
  Administered 2017-01-16: 4 via TOPICAL
  Filled 2017-01-15: qty 60

## 2017-01-15 MED ORDER — MIDAZOLAM HCL 2 MG/2ML IJ SOLN
INTRAMUSCULAR | Status: AC
  Filled 2017-01-15: qty 2

## 2017-01-15 MED ORDER — SUCCINYLCHOLINE CHLORIDE 200 MG/10ML IV SOSY
PREFILLED_SYRINGE | INTRAVENOUS | Status: AC
  Filled 2017-01-15: qty 10

## 2017-01-15 MED ORDER — BUPIVACAINE-EPINEPHRINE (PF) 0.5% -1:200000 IJ SOLN
INTRAMUSCULAR | Status: AC
  Filled 2017-01-15: qty 30

## 2017-01-15 MED ORDER — ONDANSETRON HCL 4 MG/2ML IJ SOLN
INTRAMUSCULAR | Status: AC
  Filled 2017-01-15: qty 2

## 2017-01-15 MED ORDER — LOSARTAN POTASSIUM 50 MG PO TABS
100.0000 mg | ORAL_TABLET | ORAL | Status: AC
  Administered 2017-01-15: 100 mg via ORAL
  Filled 2017-01-15 (×2): qty 2

## 2017-01-15 MED ORDER — LIDOCAINE-EPINEPHRINE 2 %-1:100000 IJ SOLN
INTRAMUSCULAR | Status: AC
  Filled 2017-01-15: qty 1

## 2017-01-15 MED ORDER — DEXAMETHASONE SODIUM PHOSPHATE 10 MG/ML IJ SOLN
INTRAMUSCULAR | Status: AC
  Filled 2017-01-15: qty 1

## 2017-01-15 MED ORDER — ACETAMINOPHEN 325 MG PO TABS: 650.0000 mg | ORAL_TABLET | Freq: Four times a day (QID) | ORAL | Status: DC | PRN

## 2017-01-15 MED ORDER — PHENYLEPHRINE 40 MCG/ML (10ML) SYRINGE FOR IV PUSH (FOR BLOOD PRESSURE SUPPORT)
PREFILLED_SYRINGE | INTRAVENOUS | Status: AC
  Filled 2017-01-15: qty 10

## 2017-01-15 MED ORDER — INSULIN ASPART 100 UNIT/ML ~~LOC~~ SOLN
0.0000 [IU] | Freq: Three times a day (TID) | SUBCUTANEOUS | Status: DC
  Administered 2017-01-15: 2 [IU] via SUBCUTANEOUS

## 2017-01-15 MED ORDER — LIDOCAINE-EPINEPHRINE 1 %-1:100000 IJ SOLN
INTRAMUSCULAR | Status: AC
  Filled 2017-01-15: qty 1

## 2017-01-15 MED ORDER — THROMBIN 20000 UNITS EX SOLR
CUTANEOUS | Status: AC
  Filled 2017-01-15: qty 20000

## 2017-01-15 MED ORDER — CLONIDINE HCL 0.2 MG PO TABS: 0.2000 mg | ORAL_TABLET | Freq: Two times a day (BID) | ORAL | Status: DC

## 2017-01-15 MED ORDER — ONDANSETRON HCL 4 MG PO TABS: 4.0000 mg | ORAL_TABLET | Freq: Four times a day (QID) | ORAL | Status: DC | PRN

## 2017-01-15 MED ORDER — HYDRALAZINE HCL 20 MG/ML IJ SOLN
20.0000 mg | INTRAMUSCULAR | Status: AC
  Administered 2017-01-15: 20 mg via INTRAVENOUS
  Filled 2017-01-15: qty 1

## 2017-01-15 MED ORDER — KETOROLAC TROMETHAMINE 30 MG/ML IJ SOLN
INTRAMUSCULAR | Status: AC
  Filled 2017-01-15: qty 1

## 2017-01-15 MED ORDER — CEFAZOLIN SODIUM-DEXTROSE 2-4 GM/100ML-% IV SOLN: 2.0000 g | INTRAVENOUS | Status: AC

## 2017-01-15 MED ORDER — THROMBIN 5000 UNITS EX SOLR
CUTANEOUS | Status: AC
  Filled 2017-01-15: qty 5000

## 2017-01-15 NOTE — Care Management Note (Signed)
Case Management Note  Patient Details  Name: Taylor Tyler MRN: [id number] Date of Birth: 07/26/1941  Subjective/Objective:   From home alone, pta indep, she drives herself to her [place] apt[place], she presents with  htn emergency from pre- op area after having spinal surgery canceled.  Patient's BP has returned to nomal level after taking home medications and should be ready for surgery on 6/15.  She states she has family that lives in the area.  She has had [place] services before but could not remember the name of agency.  She has [facility] Medicare.  Await pt/ot eval.   PCP Shamil Morayati             Action/Plan: NCM will follow for dc needs.  Expected Discharge Date:                  Expected Discharge Plan:  Home/Self Care  In-House Referral:     Discharge planning Services  CM Consult  Post Acute Care Choice:    Choice offered to:     DME Arranged:    DME Agency:     HH Arranged:    HH Agency:     Status of Service:  In process, will continue to follow  If discussed at Long Length of Stay Meetings, dates discussed:    Additional Comments:  Zenon Mayo, RN 01/15/2017, 3:16 PM

## 2017-01-15 NOTE — H&P (Signed)
History and Physical    Taylor Tyler KNL:[id number] DOB: 1941/01/17 DOA: 01/15/2017   PCP: Lenard Simmer, MD   Patient coming from/Resides with: Private residence  Admission status: Currently plan observation status and will discharge on 6/15 if unable to pursue originally planned surgical procedure on that date. If surgery can be rescheduled then patient will need to be changed to inpatient status based on previous evaluation by her insurance company and attending needs to be changed to Dr. Cyndy Freeze. If needed internal medicine can remain as consultant  Chief Complaint: Uncontrolled blood pressure  HPI: Taylor Tyler is a 76 y.o. female with medical history significant for hypertension on multiple medications, diabetes on oral agents, obesity with a BMI of 39, diverticulosis, dyslipidemia, degenerative joint disease of the knees as well as spinal stenosis of the lumbar spine. Patient was evaluated in the periop area due to reports of uncontrolled blood pressure-primarily systolic-with both surgeon and anesthesia verbalizing concerns are whether appropriate to proceed with planned elective lumbar surgery noting this procedure will take 11 hours. According to the patient, she was instructed to only take her carvedilol preoperatively and had not taken her Cozaar nor her newly prescribed clonidine. Her systolic blood pressures have been consistently greater than 215 and she's had isolated diastolic readings greater than 120. She did have a regular size cuff in place and this was changed to a larger cuff but systolic readings continue to remain greater than 790 but diastolic readings were consistently between 50 and 70. She denies chest pain, shortness of breath, visual disturbances, dizziness or headache at the present time.  Review of Systems:  In addition to the HPI above,  No Fever-chills, myalgias or other constitutional symptoms No Headache, changes with Vision or hearing, new weakness,  tingling, numbness in any extremity, dizziness, dysarthria or word finding difficulty, gait disturbance or imbalance, tremors or seizure activity No problems swallowing food or Liquids, indigestion/reflux, choking or coughing while eating, abdominal pain with or after eating No Chest pain, Cough or Shortness of Breath, palpitations, orthopnea or DOE No Abdominal pain, N/V, melena,hematochezia, dark tarry stools, constipation No dysuria, malodorous urine, hematuria or flank pain No new skin rashes, lesions, masses or bruises, No new joint pains, aches, swelling or redness No recent unintentional weight gain or loss No polyuria, polydypsia or polyphagia   Past Medical History:  Diagnosis Date  . Arthritis   . Chronic kidney disease    ?? renal insufficiency, which she thinks is coming from "all these medications"  . Diabetes mellitus without complication ([place])    diagnosed 4-5 yrs ago  . Disease of pancreas   . Hypertension     Past Surgical History:  Procedure Laterality Date  . ABDOMINAL HYSTERECTOMY  1987  . BACK SURGERY  2013  . BREAST SURGERY  1992   Breast Reduction  . CHOLECYSTECTOMY  1982  . COLON SURGERY    . COLON SURGERY    . EYE SURGERY  2017  . [place]  . LUMBAR LAMINECTOMY WITH SPINOUS PROCESS PLATE 2 LEVEL N/A 2/40/9735   Procedure: LUMBAR LAMINECTOMY/DECOMPRESSION MICRODISCECTOMY CoFlex;  Surgeon: Faythe Ghee, MD;  Location: [facility];  Service: Neurosurgery;  Laterality: N/A;  Lumbar three-four,Lumbar Four-Five Laminectomy with Coflex    Social History   Social History  . Marital status: Widowed    Spouse name: N/A  . Number of children: N/A  . Years of education: N/A   Occupational History  . Not on file.  Social History Main Topics  . Smoking status: Never Smoker  . Smokeless tobacco: Never Used  . Alcohol use No  . Drug use: No  . Sexual activity: Not on file   Other Topics Concern  . Not on file   Social History  Narrative  . No narrative on file    Mobility: Reports difficulty mobilizing secondary to low back pain for limits activity Work history: Not obtained   Allergies  Allergen Reactions  . Clams [Shellfish Allergy] Swelling and Other (See Comments)    Throat swells, and neck turns red  . Hydralazine Other (See Comments)    CHEST TIGHTNESS    Family History  Problem Relation Age of Onset  . Diabetes Father   . Heart attack Father   . Hypertension Father   . Diabetes Mother   . Hypertension Mother      Prior to Admission medications   Medication Sig Start Date End Date Taking? Authorizing Provider  atorvastatin (LIPITOR) 40 MG tablet Take 40 mg by mouth every evening.   Yes [provider]  carvedilol (COREG) 25 MG tablet Take 25 mg by mouth 2 (two) times daily.  11/20/15  Yes [provider]  cloNIDine (CATAPRES) 0.2 MG tablet Take 1 tablet (0.2 mg total) by mouth 2 (two) times daily. 07/10/16 10/15/17 Yes Hower, Aaron Mose, MD  ergocalciferol (VITAMIN D2) 50000 UNITS capsule Take 50,000 Units by mouth every Tuesday.    Yes [provider]  ferrous sulfate 325 (65 FE) MG tablet Take 325 mg by mouth daily with breakfast.   Yes [provider]  hydrochlorothiazide (HYDRODIURIL) 25 MG tablet Take 25 mg by mouth daily.  12/20/16  Yes [provider]  levothyroxine (SYNTHROID, LEVOTHROID) 100 MCG tablet Take 100 mcg by mouth daily before breakfast.  11/20/15  Yes [provider]  losartan (COZAAR) 100 MG tablet Take 100 mg by mouth daily after breakfast.  11/20/15  Yes [provider]  Magnesium Oxide 250 MG TABS Take 250 mg by mouth daily.   Yes [provider]  metFORMIN (GLUCOPHAGE) 500 MG tablet Take 500 mg by mouth 2 (two) times daily.    Yes [provider]  Multiple Vitamin (MULTIVITAMIN WITH MINERALS) TABS tablet Take 1 tablet by mouth daily. [facility]   Yes [provider]  Naproxen  Sod-Diphenhydramine (ALEVE PM) 220-25 MG TABS Take 1 tablet by mouth at bedtime as needed (sllep and pain).    Yes [provider]  naproxen sodium (ANAPROX) 220 MG tablet Take 220 mg by mouth daily as needed (pain).    Yes [provider]  Propylene Glycol-Glycerin (SOOTHE) 0.6-0.6 % SOLN Place 1 drop into both eyes 3 (three) times daily as needed (for dry/gritty eyes.).   Yes [provider]  atorvastatin (LIPITOR) 20 MG tablet Take 1 tablet (20 mg total) by mouth daily at 6 PM. Patient not taking: Reported on 01/15/2017 07/10/16 08/09/16  Lytle Butte, MD    Physical Exam: Vitals:   01/15/17 0612 01/15/17 0614 01/15/17 0627 01/15/17 0855  BP:  (!) 203/62 (!) 213/58 (!) 231/52  Pulse: (!) 54     Resp: 18     Temp: 97.7 F (36.5 C)     TempSrc: Oral     SpO2: 100%     Weight: 101.2 kg (223 lb)         Constitutional: NAD, calm, comfortable-Verbalizes frustration that surgical procedure canceled for today Eyes: PERRL, lids and conjunctivae normal ENMT:  Mucous membranes are moist. Posterior pharynx clear of any exudate or lesions.Normal dentition.  Neck: normal, supple, no masses, no thyromegaly Respiratory: clear to auscultation bilaterally, no wheezing, no crackles. Normal respiratory effort. No accessory muscle use.  Cardiovascular: Regular rate and rhythm, no murmurs / rubs / gallops. No extremity edema. 2+ pedal pulses. No carotid bruits.  Abdomen: no tenderness, no masses palpated. No hepatosplenomegaly. Bowel sounds positive.  Musculoskeletal: no clubbing / cyanosis. No joint deformity upper and lower extremities. Good ROM, no contractures. Normal muscle tone.  Skin: no rashes, lesions, ulcers. No induration Neurologic: CN 2-12 grossly intact. Sensation intact, DTR normal. Strength 5/5 x all 4 extremities.  Psychiatric: Normal judgment and insight. Alert and oriented x 3. Normal mood.    Labs on Admission: I have personally reviewed following labs and  imaging studies  CBC: No results for input(s): WBC, NEUTROABS, HGB, HCT, MCV, PLT in the last 168 hours. Basic Metabolic Panel: No results for input(s): NA, K, CL, CO2, GLUCOSE, BUN, CREATININE, CALCIUM, MG, PHOS in the last 168 hours. GFR: Estimated Creatinine Clearance: 40 mL/min (A) (by C-G formula based on SCr of 1.37 mg/dL (H)). Liver Function Tests: No results for input(s): AST, ALT, ALKPHOS, BILITOT, PROT, ALBUMIN in the last 168 hours. No results for input(s): LIPASE, AMYLASE in the last 168 hours. No results for input(s): AMMONIA in the last 168 hours. Coagulation Profile: No results for input(s): INR, PROTIME in the last 168 hours. Cardiac Enzymes: No results for input(s): CKTOTAL, CKMB, CKMBINDEX, TROPONINI in the last 168 hours. BNP (last 3 results) No results for input(s): PROBNP in the last 8760 hours. HbA1C: No results for input(s): HGBA1C in the last 72 hours. CBG: No results for input(s): GLUCAP in the last 168 hours. Lipid Profile: No results for input(s): CHOL, HDL, LDLCALC, TRIG, CHOLHDL, LDLDIRECT in the last 72 hours. Thyroid Function Tests: No results for input(s): TSH, T4TOTAL, FREET4, T3FREE, THYROIDAB in the last 72 hours. Anemia Panel: No results for input(s): VITAMINB12, FOLATE, FERRITIN, TIBC, IRON, RETICCTPCT in the last 72 hours. Urine analysis:    Component Value Date/Time   COLORURINE YELLOW (A) 07/06/2016 1414   APPEARANCEUR HAZY (A) 07/06/2016 1414   APPEARANCEUR Cloudy 12/14/2013 1727   LABSPEC 1.019 07/06/2016 1414   LABSPEC 1.012 12/14/2013 1727   PHURINE 5.0 07/06/2016 1414   GLUCOSEU NEGATIVE 07/06/2016 1414   GLUCOSEU Negative 12/14/2013 1727   HGBUR NEGATIVE 07/06/2016 1414   BILIRUBINUR NEGATIVE 07/06/2016 1414   BILIRUBINUR Negative 12/14/2013 1727   KETONESUR NEGATIVE 07/06/2016 1414   PROTEINUR 100 (A) 07/06/2016 1414   NITRITE POSITIVE (A) 07/06/2016 1414   LEUKOCYTESUR 2+ (A) 07/06/2016 1414   LEUKOCYTESUR 3+ 12/14/2013  1727   Sepsis Labs: @LABRCNTIP (procalcitonin:4,lacticidven:4) )No results found for this or any previous visit (from the past 240 hour(s)).   Radiological Exams on Admission: No results found.  EKG: (Independently reviewed) (Dated 09/05/2016)  sinus bradycardia with ventricular rate 41 bpm, QTC 409 ms, PACs, T-wave inversions leads 1 and aVL with an LV strain pattern in leads V4 through V6  Assessment/Plan Principal Problem:   Hypertensive emergency -Patient presents with asymptomatic systolic hypertension with infrequent elevations of diastolic blood pressure - suspected secondary to missed a.m. medications although documented an outpatient PCP notes recent persistent systolic hypertension requiring the addition of clonidine last week -Stat doses of home Cozaar (max dose) as well as clonidine -Hydralazine 20 mg IV 1 dose -Morphine 2 mg IV 1 -Given known EKG changes for completeness of exam will  obtain echocardiogram -If blood pressure unable to be controlled with above measures may require IV infusion of medication such as labetalol or nicardipine which would require ICU level of care  Active Problems:   Spinal stenosis of lumbar region -Plans were to proceed with surgical intervention today -Due to elevated blood pressure and concerns over increased stroke risk anesthesia has canceled today's case -Uncertain if can be rescheduled for tomorrow due to length of case (11 hrs plus) -PT evaluation in a.m. especially with reports of increased pain with mobilization -According to PCP note patient was medically cleared for surgery including undergoing a preoperative Myoview study    Diabetes mellitus without complication  -Hold her metformin acutely -SSI -HgbA1c in March was 5.8 -Monitor for hypoglycemia with utilization and metformin given CK D and GFR in the 40s    CKD (chronic kidney disease) stage 3, GFR 30-59 ml/min -Baseline renal function 25 and 1.37 -In March had elevated  creatinine of 2.18 with adjustments in diuretic medications by PCP    Hypothyroidism -Continue Synthroid -TSH was 1.89 in 2015    Diverticulosis -Currently asymptomatic    HLD (hyperlipidemia) -Continue preadmission statin      DVT prophylaxis: SCDs since surgical timing unclear Code Status: Full Family Communication: No family at bedside Disposition Plan: Home Consults called: None  -initial plan was for neurosurgery to be attending (see above)    ELLIS,ALLISON L. ANP-BC [facility] Pager [phone]   If 7PM-7AM, please contact night-coverage [email or web address] Password [email or web address]  01/15/2017, 9:12 AM

## 2017-01-15 NOTE — H&P (Signed)
CC: BACK PAIN  HPI: Taylor Tyler is a 76 y.o. female with a long standing history of low back pain with radiculopathy. She has failed conservative treatment. She is present today for elective surgery. Feels well. No concerns. Ready for surgery.  PMH: Past Medical History:  Diagnosis Date  . Arthritis   . Chronic kidney disease    ?? renal insufficiency, which she thinks is coming from "all these medications"  . Diabetes mellitus without complication ([place])    diagnosed 4-5 yrs ago  . Disease of pancreas   . Hypertension     PSH: Past Surgical History:  Procedure Laterality Date  . ABDOMINAL HYSTERECTOMY  1987  . BACK SURGERY  2013  . BREAST SURGERY  1992   Breast Reduction  . CHOLECYSTECTOMY  1982  . COLON SURGERY    . COLON SURGERY    . EYE SURGERY  2017  . [place]  . LUMBAR LAMINECTOMY WITH SPINOUS PROCESS PLATE 2 LEVEL N/A 7/56/4332   Procedure: LUMBAR LAMINECTOMY/DECOMPRESSION MICRODISCECTOMY CoFlex;  Surgeon: Faythe Ghee, MD;  Location: [facility];  Service: Neurosurgery;  Laterality: N/A;  Lumbar three-four,Lumbar Four-Five Laminectomy with Coflex    SH: Social History  Substance Use Topics  . Smoking status: Never Smoker  . Smokeless tobacco: Never Used  . Alcohol use No    MEDS: Prior to Admission medications   Medication Sig Start Date End Date Taking? Authorizing Provider  atorvastatin (LIPITOR) 40 MG tablet Take 40 mg by mouth every evening.   Yes [provider]  carvedilol (COREG) 25 MG tablet Take 25 mg by mouth 2 (two) times daily.  11/20/15  Yes [provider]  ergocalciferol (VITAMIN D2) 50000 UNITS capsule Take 50,000 Units by mouth once a week. On Tuesdays   Yes [provider]  ferrous sulfate 325 (65 FE) MG tablet Take 325 mg by mouth daily with breakfast.   Yes [provider]  hydrochlorothiazide (HYDRODIURIL) 25 MG tablet Take 25 mg by mouth daily.  12/20/16  Yes [provider]   levothyroxine (SYNTHROID, LEVOTHROID) 112 MCG tablet Take 112 mcg by mouth daily before breakfast.  11/20/15  Yes [provider]  losartan (COZAAR) 100 MG tablet Take 100 mg by mouth daily after breakfast.  11/20/15  Yes [provider]  Magnesium Oxide 250 MG TABS Take 250 mg by mouth daily.   Yes [provider]  metFORMIN (GLUCOPHAGE) 500 MG tablet Take 500 mg by mouth 2 (two) times daily.    Yes [provider]  Multiple Vitamin (MULTIVITAMIN WITH MINERALS) TABS tablet Take 1 tablet by mouth daily. [facility]   Yes [provider]  Naproxen Sod-Diphenhydramine (ALEVE PM) 220-25 MG TABS Take 1 tablet by mouth at bedtime as needed (sllep and pain).    Yes [provider]  naproxen sodium (ANAPROX) 220 MG tablet Take 220 mg by mouth daily as needed (pain).    Yes [provider]  Propylene Glycol-Glycerin (SOOTHE) 0.6-0.6 % SOLN Place 1 drop into both eyes 3 (three) times daily as needed (for dry/gritty eyes.).   Yes [provider]  atorvastatin (LIPITOR) 20 MG tablet Take 1 tablet (20 mg total) by mouth daily at 6 PM. Patient not taking: Reported on 10/15/2016 07/10/16 08/09/16  Hower, Aaron Mose, MD  cloNIDine (CATAPRES) 0.2 MG tablet Take 1 tablet (0.2 mg total) by mouth 2 (two) times daily. Patient not taking: Reported on 12/24/2016 07/10/16 10/15/17  Hower, Aaron Mose,  MD  ONE TOUCH ULTRA TEST test strip 1 each by Other route daily.  10/18/15   [provider]  Jonetta Speak LANCETS FINE MISC 1 Stick by Other route daily.  10/18/15   [provider]    ALLERGY: Allergies  Allergen Reactions  . Clams [Shellfish Allergy] Swelling and Other (See Comments)    Throat swells, and neck turns red  . Hydralazine Other (See Comments)    CHEST TIGHTNESS    ROS: ROS  Vitals:   01/15/17 0614 01/15/17 0627  BP: (!) 203/62 (!) 213/58  Pulse:    Resp:    Temp:     General appearance: WDWN, NAD Eyes: PERRL,  Fundoscopic: normal Cardiovascular: Regular rate and rhythm without murmurs, rubs, gallops. No edema or variciosities. Distal pulses normal. Pulmonary: Clear to auscultation Musculoskeletal:     Muscle tone upper extremities: Normal    Muscle tone lower extremities: Normal    Motor exam: Upper Extremities Deltoid Bicep Tricep Grip  Right 5/5 5/5 5/5 5/5  Left 5/5 5/5 5/5 5/5   Lower Extremity IP Quad PF DF EHL  Right 5/5 5/5 5/5 5/5 5/5  Left 5/5 5/5 5/5 5/5 5/5   Neurological Awake, alert, oriented Memory and concentration grossly intact Speech fluent, appropriate CNII: Visual fields normal CNIII/IV/VI: EOMI CNV: Facial sensation normal CNVII: Symmetric, normal strength CNVIII: Grossly normal CNIX: Normal palate movement CNXI: Trap and SCM strength normal CN XII: Tongue protrusion normal Sensation grossly intact to LT DTR: Normal Coordination (finger/nose & heel/shin): Normal  IMAGING: No new imaging  IMPRESSION/PLAN - 76 y.o. female with lumbosacral spondylosis with radiculopathy. In the office I recommended L5-S1 ALIF as well as L3-5 transpsoas lumbar interbody fusion and facetectomies at L3-4, L4-5 and L5-S1 as well as posterior fixation and fusion. I've reviewed her images again on several occasions and think that we can adequately accomplish the decompression required indirectly with appropriate cage position.  If we can get good indirect decompression with the interbodies we will not proceed with the posterior decompression portion.  This will avoid the risk to the nerves of operating through scar tissue.  She and I have discussed this as well as reviewed the risks, benefits, and alternatives to all aspects of the surgery.  She states understanding and wishes to proceed.

## 2017-01-15 NOTE — Interval H&P Note (Signed)
History and Physical Interval Note:  01/15/2017 7:17 AM  Taylor Tyler  has presented today for surgery, with the diagnosis of Lumbosacral spondylosis with radiculopathy  The various methods of treatment have been discussed with the patient and family. After consideration of risks, benefits and other options for treatment, the patient has consented to  Procedure(s) with comments: L5-S1 Anterior lumbar interbody fusion with Dr. [name] to assist (N/A) - L5-S1 Anterior lumbar interbody fusion with Dr. [name] to assist L3-5 Anterior lateral lumbar interbody fusion with removal of coflex at L3-4, L4-5. L3-4 L5-S1 Laminectomy with facetectomies for decompression/L4-5 laminectomy with gill procedure/L3-S1 Dorsal internal fixation/Mazor robot (N/A) - L3-5 Anterior lateral lumbar interbody fusion with removal of coflex at L3-4, L4-5. L3-4 L5-S1 Laminectomy with facetectomies for decompression/L4-5 laminectomy with gill procedure/L3-S1 Dorsal internal fixation/Mazor robot APPLICATION OF ROBOTIC ASSISTANCE FOR SPINAL PROCEDURE (N/A) POSTERIOR LUMBAR FUSION 3 LEVEL (N/A) ABDOMINAL EXPOSURE (N/A) as a surgical intervention .  The patient's history has been reviewed, patient examined, no change in status, stable for surgery.  I have reviewed the patient's chart and labs.  Questions were answered to the patient's satisfaction.     [name]

## 2017-01-15 NOTE — Progress Notes (Signed)
VASCULAR SURGERY  This patient presented for anterior retroperitoneal exposure of L5-S1. Her blood pressure was poorly controlled and she has been evaluated by medicine and anesthesia. Her surgery today was canceled. She will have to be rescheduled when her blood pressure is under better control.  Taylor Mayo, MD, [place] [phone] Office: [phone]

## 2017-01-15 NOTE — H&P (View-Only) (Signed)
Patient name: Taylor Tyler MRN: [id number] DOB: Apr 25, 1941 Sex: female   REASON FOR CONSULT:     Evaluate for anterior retroperitonea exposure of L5-S1. Referred by Dr. Cyndy Freeze.  HPI:   Taylor Tyler is a 76 y.o. female, Who I had previously seen in consultation in March of this year. We were considering anterior retroperitoneal exposure of L5-S1. My main concern was that based on her CT scan of the lumbar spine, she did have calcific disease in the infrarenal aorta and also bilateral common iliac arteries. I was also concerned given her morbid obesity with a BMI of 39. She also had significant scar tissue from previous abdominal surgery at the lower aspect of her abdomen which could potentially interfere with mobilization of her retroperitoneum. I discussed these concerns with Dr. Cyndy Freeze and he felt that this was the best approach to her problem and we agreed that it would be reasonable to attempt an anterior retroperitoneal exposure at this level with the understanding that there is some risk that we would not be able to do this safely in which case we would have to abort the procedure. Her surgery was postponed because of medical issues. She returns to discuss this further.  I have reviewed the records it was sent with the patient. The patient has had a long history of back pain. The pain is mostly on the right side and she has associated radiculopathy. This has been a chronic problem for over a year. The patient had a CT of the lumbar spine on 09/30/2016. This showed multifactorial spinal stenosis at L3-L4, L4-L5, and L5-S1.  Past Medical History:  Diagnosis Date  . Arthritis   . Diabetes mellitus without complication ([place])   . Disease of pancreas   . Hypertension     Family History  Problem Relation Age of Onset  . Diabetes Father   . Heart attack Father   . Hypertension Father   . Diabetes Mother   . Hypertension Mother     SOCIAL HISTORY: Social History   Social History  .  Marital status: Widowed    Spouse name: N/A  . Number of children: N/A  . Years of education: N/A   Occupational History  . Not on file.   Social History Main Topics  . Smoking status: Never Smoker  . Smokeless tobacco: Never Used  . Alcohol use No  . Drug use: No  . Sexual activity: Not on file   Other Topics Concern  . Not on file   Social History Narrative  . No narrative on file    Allergies  Allergen Reactions  . Hydralazine Other (See Comments)    Chest tightness     Current Outpatient Prescriptions  Medication Sig Dispense Refill  . atorvastatin (LIPITOR) 40 MG tablet Take 40 mg by mouth every evening.    . carvedilol (COREG) 25 MG tablet Take 25 mg by mouth 2 (two) times daily.     . ergocalciferol (VITAMIN D2) 50000 UNITS capsule Take 50,000 Units by mouth every Tuesday.     . ferrous sulfate 325 (65 FE) MG tablet Take 325 mg by mouth daily with breakfast.    . furosemide (LASIX) 20 MG tablet Take 20 mg by mouth daily.    . hydrochlorothiazide (HYDRODIURIL) 25 MG tablet     . levothyroxine (SYNTHROID, LEVOTHROID) 112 MCG tablet Take 112 mcg by mouth daily before breakfast.     . losartan (COZAAR) 100 MG tablet Take 100 mg by  mouth daily after breakfast.     . Magnesium 100 MG TABS Take 150 mg by mouth daily.    . metFORMIN (GLUCOPHAGE) 500 MG tablet Take 500 mg by mouth 2 (two) times daily.     . Naproxen Sod-Diphenhydramine (ALEVE PM) 220-25 MG TABS Take 1 tablet by mouth at bedtime.    . naproxen sodium (ANAPROX) 220 MG tablet Take 220 mg by mouth daily.     . ONE TOUCH ULTRA TEST test strip 1 each by Other route daily.     Glory Rosebush DELICA LANCETS FINE MISC 1 Stick by Other route daily.     Marland Kitchen Propylene Glycol-Glycerin (SOOTHE) 0.6-0.6 % SOLN Place 1 drop into both eyes 3 (three) times daily as needed (for dry/gritty eyes.).    Marland Kitchen amLODipine (NORVASC) 5 MG tablet Take 5 mg by mouth daily.    Marland Kitchen atorvastatin (LIPITOR) 20 MG tablet Take 1 tablet (20 mg total) by  mouth daily at 6 PM. (Patient not taking: Reported on 10/15/2016) 30 tablet 0  . cloNIDine (CATAPRES) 0.2 MG tablet Take 1 tablet (0.2 mg total) by mouth 2 (two) times daily. (Patient not taking: Reported on 12/24/2016) 60 tablet 11   No current facility-administered medications for this visit.     REVIEW OF SYSTEMS:  [X]  denotes positive finding, [ ]  denotes negative finding Cardiac  Comments:  Chest pain or chest pressure:    Shortness of breath upon exertion: X   Short of breath when lying flat:    Irregular heart rhythm:        Vascular    Pain in calf, thigh, or hip brought on by ambulation: X   Pain in feet at night that wakes you up from your sleep:     Blood clot in your veins:    Leg swelling:         Pulmonary    Oxygen at home:    Productive cough:     Wheezing:         Neurologic    Sudden weakness in arms or legs:     Sudden numbness in arms or legs:     Sudden onset of difficulty speaking or slurred speech:    Temporary loss of vision in one eye:     Problems with dizziness:         Gastrointestinal    Blood in stool:     Vomited blood:         Genitourinary    Burning when urinating:     Blood in urine:        Psychiatric    Major depression:         Hematologic    Bleeding problems:    Problems with blood clotting too easily:        Skin    Rashes or ulcers:        Constitutional    Fever or chills:     PHYSICAL EXAM:   Vitals:   12/24/16 1319 12/24/16 1327  BP: (!) 184/73 (!) 166/70  Pulse: (!) 54 (!) 54  Resp: 18   Temp: 97 F (36.1 C)   SpO2: 99%   Weight: 220 lb (99.8 kg)   Height: 5\' 3"  (1.6 m)   Body mass index is 38.97 kg/m.   GENERAL: The patient is a well-nourished female, in no acute distress. The vital signs are documented above. CARDIAC: There is a regular rate and rhythm.  VASCULAR: I do not detect carotid bruits.  She does have palpable dorsalis pedis pulses. She has biphasic Doppler signals in both feet. PULMONARY:  There is good air exchange bilaterally without wheezing or rales. ABDOMEN: She has a large pannus which hangs over her groin. She has multiple scars from previous abdominal surgery. MUSCULOSKELETAL: There are no major deformities or cyanosis. NEUROLOGIC: No focal weakness or paresthesias are detected. SKIN: There are no ulcers or rashes noted. PSYCHIATRIC: The patient has a normal affect.  DATA:    No new data.  MEDICAL ISSUES:   DEGENERATIVE DISC DISEASE L5-S1: The patient has been rescheduled for surgery on 01/15/2017. I will attempt to provide anterior retroperitoneal exposure of L5-S1. I have multiple concerns as noted in my previous note. In order to make the incision in the appropriate location I believe that we will have to tape her pannus superiorly. She certainly will be at high risk for wound infection given her diabetes and morbid obesity. Her BMI is 39. Certainly the depths of the wound will make the procedure technically challenging. Hopefully her multiple previous abdominal procedures will also not make the retroperitoneal dissection more difficult. She does have some calcium in her aorta and common iliac arteries however I think that we will below that level.  I have again discussed with her the indications for the procedure and the potential complications, including but not limited to bleeding, arterial or venous injury, thrombosis, and wound healing problems. All of her questions were answered and she is agreeable to proceed.  [name] [facility] [phone]

## 2017-01-16 ENCOUNTER — Observation Stay (HOSPITAL_BASED_OUTPATIENT_CLINIC_OR_DEPARTMENT_OTHER): Payer: Medicare Other

## 2017-01-16 ENCOUNTER — Other Ambulatory Visit: Payer: Self-pay | Admitting: Neurological Surgery

## 2017-01-16 DIAGNOSIS — R9431 Abnormal electrocardiogram [ECG] [EKG]: Secondary | ICD-10-CM | POA: Diagnosis not present

## 2017-01-16 DIAGNOSIS — M48061 Spinal stenosis, lumbar region without neurogenic claudication: Secondary | ICD-10-CM

## 2017-01-16 DIAGNOSIS — E119 Type 2 diabetes mellitus without complications: Secondary | ICD-10-CM

## 2017-01-16 DIAGNOSIS — I161 Hypertensive emergency: Secondary | ICD-10-CM

## 2017-01-16 DIAGNOSIS — N183 Chronic kidney disease, stage 3 (moderate): Secondary | ICD-10-CM

## 2017-01-16 DIAGNOSIS — E784 Other hyperlipidemia: Secondary | ICD-10-CM

## 2017-01-16 DIAGNOSIS — M4727 Other spondylosis with radiculopathy, lumbosacral region: Secondary | ICD-10-CM | POA: Diagnosis not present

## 2017-01-16 LAB — GLUCOSE, CAPILLARY
Glucose-Capillary: 108 mg/dL — ABNORMAL HIGH (ref 65–99)
Glucose-Capillary: 93 mg/dL (ref 65–99)
Glucose-Capillary: 122 mg/dL — ABNORMAL HIGH (ref 65–99)

## 2017-01-16 LAB — COMPREHENSIVE METABOLIC PANEL
ALT: 17 U/L (ref 14–54)
AST: 24 U/L (ref 15–41)
Albumin: 3.1 g/dL — ABNORMAL LOW (ref 3.5–5.0)
Alkaline Phosphatase: 40 U/L (ref 38–126)
Anion gap: 8 (ref 5–15)
BUN: 48 mg/dL — ABNORMAL HIGH (ref 6–20)
CO2: 23 mmol/L (ref 22–32)
CREATININE: 1.63 mg/dL — AB (ref 0.44–1.00)
Calcium: 9.2 mg/dL (ref 8.9–10.3)
Chloride: 106 mmol/L (ref 101–111)
GFR calc non Af Amer: 30 mL/min — ABNORMAL LOW (ref >60)
GFR, EST AFRICAN AMERICAN: 35 mL/min — AB (ref >60)
GLUCOSE: 112 mg/dL — AB (ref 65–99)
Potassium: 4.3 mmol/L (ref 3.5–5.1)
SODIUM: 137 mmol/L (ref 135–145)
Total Bilirubin: 0.6 mg/dL (ref 0.3–1.2)
Total Protein: 6.7 g/dL (ref 6.5–8.1)

## 2017-01-16 LAB — CBC
HCT: 30.8 % — ABNORMAL LOW (ref 36.0–46.0)
Hemoglobin: 9.9 g/dL — ABNORMAL LOW (ref 12.0–15.0)
MCH: 28.3 pg (ref 26.0–34.0)
MCHC: 32.1 g/dL (ref 30.0–36.0)
MCV: 88 fL (ref 78.0–100.0)
PLATELETS: 229 10*3/uL (ref 150–400)
RBC: 3.5 MIL/uL — AB (ref 3.87–5.11)
RDW: 13.4 % (ref 11.5–15.5)
WBC: 6.6 10*3/uL (ref 4.0–10.5)

## 2017-01-16 LAB — ECHOCARDIOGRAM COMPLETE
HEIGHTINCHES: 61 in
WEIGHTICAEL: 3688 [oz_av]

## 2017-01-16 NOTE — Care Management Obs Status (Signed)
[facility] NOTIFICATION   Patient Details  Name: Taylor Tyler MRN: [id number] Date of Birth: Mar 08, 1941   Medicare Observation Status Notification Given:  Yes    Zenon Mayo, RN 01/16/2017, 3:05 PM

## 2017-01-16 NOTE — Progress Notes (Signed)
Patient discharged home. Discharge instructions provided. IV removed. Son transported home via private vehicle. No s/sx of distress noted. Wheeled down by RN in w/c and safely assisted in car and buckled up.

## 2017-01-16 NOTE — Discharge Summary (Signed)
Physician Discharge Summary  Taylor Tyler RXV:[id number] DOB: 07-Feb-1941 DOA: 01/15/2017  PCP: Lenard Simmer, MD  Admit date: 01/15/2017 Discharge date: 01/16/2017  Time spent: 30 minutes  Recommendations for Outpatient Follow-up:  Spinal stenosis of lumbar region -Plans were to proceed with surgical intervention today. However surgery was canceled -Neurosurgery was contacted by [facility] Tomi Bamberger, and RN Cybill and was informed Dr. Cyndy Freeze or his PA would be up to discuss future surgery plan. -PA Ferne Reus, place note in chart stating that surgery was to be rescheduled however per patient nobody but myself evaluated her today. Per his note patient safe for discharge. -I contacted neurosurgery office and requested that Dr. Cyndy Freeze or PA Ferne Reus contact me for clarification on plan for patient. Call was never returned. -No follow-up date given for office visit. -No follow-up date for surgery given, although on surgery scheduling appears that they plan to perform surgery on 25 July. -Patient and son provided number to Neurosurgery [phone]. Request follow-up date in one week, to discuss surgery options.    Diabetes mellitus without complication  --PYKD9I in March was 5.8 -Restart home medication  CKD (chronic kidney disease) stage 3, GFR 30-59 ml/min -Baseline renal function 25 and 1.37 -In March had elevated creatinine of 2.18 with adjustments in diuretic medications by PCP Lab Results  Component Value Date   CREATININE 1.63 (H) 01/16/2017   CREATININE 1.37 (H) 01/01/2017   CREATININE 2.18 (H) 10/20/2016  -Near baseline PCP to monitor    Hypothyroidism -Continue Synthroid -TSH was 1.89 in 2015    Diverticulosis -Currently asymptomatic    HLD (hyperlipidemia) -Continue preadmission statin      Discharge Diagnoses:  Principal Problem:   Hypertensive emergency Active Problems:   Diabetes mellitus without complication ([facility])   CKD (chronic kidney  disease) stage 3, GFR 30-59 ml/min   Hypothyroidism   Diverticulosis   HLD (hyperlipidemia)   Spinal stenosis of lumbar region   Discharge Condition: Stable  Diet recommendation: American diabetic Association, heart healthy  Filed Weights   01/15/17 0612 01/15/17 1143 01/16/17 0500  Weight: 223 lb (101.2 kg) 223 lb 8 oz (101.4 kg) 230 lb 8 oz (104.6 kg)    History of present illness:  76 y.o. BF PMHx HTN,DM type II on PO agents, CKD, HLD, DJD, Spinal stenosis of the lumbar spine. Diverticulosis, obesity  Patient was evaluated in the periop area due to reports of uncontrolled blood pressure-primarily systolic-with both surgeon and anesthesia verbalizing concerns are whether appropriate to proceed with planned elective lumbar surgery noting this procedure will take 11 hours. According to the patient, she was instructed to only take her carvedilol preoperatively and had not taken her Cozaar nor her newly prescribed clonidine. Her systolic blood pressures have been consistently greater than 215 and she's had isolated diastolic readings greater than 120. She did have a regular size cuff in place and this was changed to a larger cuff but systolic readings continue to remain greater than 338 but diastolic readings were consistently between 50 and 70. She denies chest pain, shortness of breath, visual disturbances, dizziness or headache at the present time.   Procedures: None  Consultations: None  Antibiotics None   Discharge Exam: Vitals:   01/16/17 0757 01/16/17 1133 01/16/17 1606 01/16/17 1757  BP: (!) 158/57 (!) 143/53 (!) 180/78 (!) 161/61  Pulse: (!) 56 (!) 50 (!) 58 (!) 56  Resp: 18 15 17 18   Temp: 98.9 F (37.2 C) 98.1 F (36.7 C) 97.8 F (36.6  C)   TempSrc: Oral Oral Oral   SpO2: 99% 97% 97% 96%  Weight:      Height:        General: A/O 4, NAD, No acute respiratory distress Cardiovascular: Regular rhythm and rate, negative murmurs rubs or gallops, normal  S1/S2 Respiratory: Clear to auscultation bilaterally, negative wheezing or crackles    Discharge Instructions   Allergies as of 01/16/2017      Reactions   Clams [shellfish Allergy] Swelling, Other (See Comments)   Throat swells, and neck turns red   Hydralazine Other (See Comments)   CHEST TIGHTNESS      Medication List    TAKE these medications   ALEVE PM 220-25 MG Tabs Generic drug:  Naproxen Sod-Diphenhydramine Take 1 tablet by mouth at bedtime as needed (sllep and pain).   atorvastatin 40 MG tablet Commonly known as:  LIPITOR Take 40 mg by mouth every evening. What changed:  Another medication with the same name was removed. Continue taking this medication, and follow the directions you see here.   carvedilol 25 MG tablet Commonly known as:  COREG Take 25 mg by mouth 2 (two) times daily.   cloNIDine 0.2 MG tablet Commonly known as:  CATAPRES Take 1 tablet (0.2 mg total) by mouth 2 (two) times daily.   ergocalciferol 50000 units capsule Commonly known as:  VITAMIN D2 Take 50,000 Units by mouth every Tuesday.   ferrous sulfate 325 (65 FE) MG tablet Take 325 mg by mouth daily with breakfast.   hydrochlorothiazide 25 MG tablet Commonly known as:  HYDRODIURIL Take 25 mg by mouth daily.   levothyroxine 100 MCG tablet Commonly known as:  SYNTHROID, LEVOTHROID Take 100 mcg by mouth daily before breakfast.   losartan 100 MG tablet Commonly known as:  COZAAR Take 100 mg by mouth daily after breakfast.   Magnesium Oxide 250 MG Tabs Take 250 mg by mouth daily.   metFORMIN 500 MG tablet Commonly known as:  GLUCOPHAGE Take 500 mg by mouth 2 (two) times daily.   multivitamin with minerals Tabs tablet Take 1 tablet by mouth daily. [employer] [facility]   naproxen sodium 220 MG tablet Commonly known as:  ANAPROX Take 220 mg by mouth daily as needed (pain).   SOOTHE 0.6-0.6 % Soln Generic drug:  Propylene Glycol-Glycerin Place 1 drop into both eyes 3 (three) times daily  as needed (for dry/gritty eyes.).      Allergies  Allergen Reactions  . Clams [Shellfish Allergy] Swelling and Other (See Comments)    Throat swells, and neck turns red  . Hydralazine Other (See Comments)    CHEST TIGHTNESS      The results of significant diagnostics from this hospitalization (including imaging, microbiology, ancillary and laboratory) are listed below for reference.    Significant Diagnostic Studies: No results found.  Microbiology: Recent Results (from the past 240 hour(s))  MRSA PCR Screening     Status: None   Collection Time: 01/15/17 11:45 AM  Result Value Ref Range Status   MRSA by PCR NEGATIVE NEGATIVE Final    Comment:        The GeneXpert MRSA Assay (FDA approved for NASAL specimens only), is one component of a comprehensive MRSA colonization surveillance program. It is not intended to diagnose MRSA infection nor to guide or monitor treatment for MRSA infections.      Labs: Basic Metabolic Panel:  Recent Labs Lab 01/15/17 1530 01/16/17 0404  NA  --  137  K  --  4.3  CL  --  106  CO2  --  23  GLUCOSE  --  112*  BUN  --  48*  CREATININE  --  1.63*  CALCIUM  --  9.2  MG 1.6*  --   PHOS 3.4  --    Liver Function Tests:  Recent Labs Lab 01/16/17 0404  AST 24  ALT 17  ALKPHOS 40  BILITOT 0.6  PROT 6.7  ALBUMIN 3.1*   No results for input(s): LIPASE, AMYLASE in the last 168 hours. No results for input(s): AMMONIA in the last 168 hours. CBC:  Recent Labs Lab 01/16/17 0404  WBC 6.6  HGB 9.9*  HCT 30.8*  MCV 88.0  PLT 229   Cardiac Enzymes: No results for input(s): CKTOTAL, CKMB, CKMBINDEX, TROPONINI in the last 168 hours. BNP: BNP (last 3 results) No results for input(s): BNP in the last 8760 hours.  ProBNP (last 3 results) No results for input(s): PROBNP in the last 8760 hours.  CBG:  Recent Labs Lab 01/15/17 1557 01/15/17 2122 01/16/17 0753 01/16/17 1131 01/16/17 1615  GLUCAP 145* 107* 108* 93 122*        Signed:  Dia Crawford, MD [facility] [phone] pager

## 2017-01-16 NOTE — Progress Notes (Signed)
Pt is to be rescheduled for surgery at a later date Cleared for discharge from a NS perspective Reviewed with Dr Cyndy Freeze

## 2017-01-16 NOTE — Evaluation (Signed)
Physical Therapy Evaluation Patient Details Name: TERENCE GOOGE MRN: [id number] DOB: [birth year] Today's Date: 01/16/2017   History of Present Illness  76 y.o. female admitted on 01/15/17 due to found to have significantly high BP in pre-op and had her spinal surgery cx.  Pt with significant PMH of HTN, DM, CKD, lumbar surgery x2, and colon surgery.  Clinical Impression  Pt is at her functional baseline.  PT started education on back precautions so that she will be familiar with them post operatively.  PT to sign off and await post op re-order as pt is doing well with her mobility.    Follow Up Recommendations No PT follow up    Equipment Recommendations  None recommended by PT    Recommendations for Other Services   NA    Precautions / Restrictions Precautions Precautions: Back Precaution Booklet Issued: No Precaution Comments: We verbally reviewed back precautions and log roll technique in preparation for her to possibly have surgery later today.  Pt was familiar with these from previous surgeries.       Mobility  Bed Mobility Overal bed mobility: Modified Independent             General bed mobility comments: pt demonstrated log roll, used railing for leverage  Transfers Overall transfer level: Modified independent Equipment used: Rolling walker (2 wheeled)                Ambulation/Gait Ambulation/Gait assistance: Supervision Ambulation Distance (Feet): 100 Feet Assistive device: Rolling walker (2 wheeled) Gait Pattern/deviations: Step-through pattern;Trunk flexed Gait velocity: decreased Gait velocity interpretation: Below normal speed for age/gender General Gait Details: verbal cues for upright posture, slow speed          Balance Overall balance assessment: Needs assistance Sitting-balance support: Feet supported;Bilateral upper extremity supported;No upper extremity supported Sitting balance-Leahy Scale: Good     Standing balance support: Single  extremity supported;Bilateral upper extremity supported;No upper extremity supported Standing balance-Leahy Scale: Fair                               Pertinent Vitals/Pain Pain Assessment: 0-10 Pain Score: 5  Pain Location: low back Pain Descriptors / Indicators: Grimacing;Guarding Pain Intervention(s): Limited activity within patient's tolerance;Monitored during session;Repositioned    Home Living Family/patient expects to be discharged to:: Private residence Living Arrangements: Alone Available Help at Discharge: Family;Available PRN/intermittently Type of Home: House Home Access: Stairs to enter Entrance Stairs-Rails: None Entrance Stairs-Number of Steps: 1 Home Layout: One level Home Equipment: Grab bars - tub/shower;Walker - 2 wheels;Cane - quad;Shower seat;Bedside commode      Prior Function Level of Independence: Independent with assistive device(s)         Comments: Pt uses RW when her back is bothering her.       Hand Dominance   Dominant Hand: Right    Extremity/Trunk Assessment   Upper Extremity Assessment Upper Extremity Assessment: Overall WFL for tasks assessed    Lower Extremity Assessment Lower Extremity Assessment: Overall WFL for tasks assessed    Cervical / Trunk Assessment Cervical / Trunk Assessment: Other exceptions Cervical / Trunk Exceptions: h/o lumbar surgery x 2  Communication   Communication: No difficulties  Cognition Arousal/Alertness: Awake/alert Behavior During Therapy: WFL for tasks assessed/performed Overall Cognitive Status: Within Functional Limits for tasks assessed  Assessment/Plan    PT Assessment Patent does not need any further PT services (until after her surgery)         PT Goals (Current goals can be found in the Care Plan section)  Acute Rehab PT Goals PT Goal Formulation: All assessment and education complete, DC therapy      AM-PAC PT "6 Clicks" Daily Activity  Outcome Measure Difficulty turning over in bed (including adjusting bedclothes, sheets and blankets)?: None Difficulty moving from lying on back to sitting on the side of the bed? : None Difficulty sitting down on and standing up from a chair with arms (e.g., wheelchair, bedside commode, etc,.)?: None Help needed moving to and from a bed to chair (including a wheelchair)?: None Help needed walking in hospital room?: None Help needed climbing 3-5 steps with a railing? : None 6 Click Score: 24    End of Session   Activity Tolerance: Patient tolerated treatment well Patient left: in chair;with call bell/phone within reach;with chair alarm set Nurse Communication: Mobility status PT Visit Diagnosis: Pain;Other abnormalities of gait and mobility (R26.89) Pain - part of body:  (back)    Time: 1203-1223 PT Time Calculation (min) (ACUTE ONLY): 20 min   Charges:   PT Evaluation $PT Eval Moderate Complexity: 1 Procedure     PT G Codes:   PT G-Codes **NOT FOR INPATIENT CLASS** Functional Assessment Tool Used: AM-PAC 6 Clicks Basic Mobility Functional Limitation: Mobility: Walking and moving around Mobility: Walking and Moving Around Current Status (D3570): 0 percent impaired, limited or restricted Mobility: Walking and Moving Around Goal Status (V7793): 0 percent impaired, limited or restricted Mobility: Walking and Moving Around Discharge Status (J0300): 0 percent impaired, limited or restricted   Aaliyah Cancro B. Schleicher, [place], DPT [phone]   01/16/2017, 5:13 PM

## 2017-01-16 NOTE — Progress Notes (Signed)
  Echocardiogram 2D Echocardiogram has been performed.  Bobbye Charleston 01/16/2017, 11:48 AM

## 2017-01-16 NOTE — Progress Notes (Signed)
ABN  Given to patient , Dr. Sherral Hammers notified.

## 2017-01-26 ENCOUNTER — Other Ambulatory Visit: Payer: Self-pay

## 2017-02-13 ENCOUNTER — Encounter (INDEPENDENT_AMBULATORY_CARE_PROVIDER_SITE_OTHER): Payer: Medicare Other | Admitting: Ophthalmology

## 2017-02-13 DIAGNOSIS — E11311 Type 2 diabetes mellitus with unspecified diabetic retinopathy with macular edema: Secondary | ICD-10-CM | POA: Diagnosis not present

## 2017-02-13 DIAGNOSIS — H35033 Hypertensive retinopathy, bilateral: Secondary | ICD-10-CM | POA: Diagnosis not present

## 2017-02-13 DIAGNOSIS — E113392 Type 2 diabetes mellitus with moderate nonproliferative diabetic retinopathy without macular edema, left eye: Secondary | ICD-10-CM

## 2017-02-13 DIAGNOSIS — I10 Essential (primary) hypertension: Secondary | ICD-10-CM

## 2017-02-13 DIAGNOSIS — E113311 Type 2 diabetes mellitus with moderate nonproliferative diabetic retinopathy with macular edema, right eye: Secondary | ICD-10-CM

## 2017-02-13 DIAGNOSIS — H43813 Vitreous degeneration, bilateral: Secondary | ICD-10-CM

## 2017-02-13 DIAGNOSIS — H34811 Central retinal vein occlusion, right eye, with macular edema: Secondary | ICD-10-CM | POA: Diagnosis not present

## 2017-02-13 NOTE — Pre-Procedure Instructions (Signed)
Taylor Tyler  02/13/2017      [facility] [address], [place] - [place] [address] Phone: [phone] Fax: [phone]    Your procedure is scheduled on February 24, 2017.  Report to [facility] Admitting at 530 AM.  Call this number if you have problems the morning of surgery:  [phone]   Remember:  Do not eat food or drink liquids after midnight.  Take these medicines the morning of surgery with A SIP OF WATER carvedilol (coreg), clonidine (catapres), levothyroxine (synthroid), eye drops-if needed.  7 days prior to surgery STOP taking any Aspirin, Aleve, Naproxen, Ibuprofen, Motrin, Advil, Goody's, BC's, all herbal medications, fish oil, and all vitamins   Do not wear jewelry, make-up or nail polish.  Do not wear lotions, powders, or perfumes, or deoderant.  Do not shave 48 hours prior to surgery.  Men may shave face and neck.  Do not bring valuables to the hospital.  [facility] is not responsible for any belongings or valuables.  Contacts, dentures or bridgework may not be worn into surgery.  Leave your suitcase in the car.  After surgery it may be brought to your room.  For patients admitted to the hospital, discharge time will be determined by your treatment team.  Patients discharged the day of surgery will not be allowed to drive home.   Special instructions:  [place]- Preparing For Surgery  Before surgery, you can play an important role. Because skin is not sterile, your skin needs to be as free of germs as possible. You can reduce the number of germs on your skin by washing with CHG (chlorahexidine gluconate) Soap before surgery.  CHG is an antiseptic cleaner which kills germs and bonds with the skin to continue killing germs even after washing.  Please do not use if you have an allergy to CHG or antibacterial soaps. If your skin becomes reddened/irritated stop using the CHG.  Do not shave (including legs  and underarms) for at least 48 hours prior to first CHG shower. It is OK to shave your face.  Please follow these instructions carefully.   1. Shower the NIGHT BEFORE SURGERY and the MORNING OF SURGERY with CHG.   2. If you chose to wash your hair, wash your hair first as usual with your normal shampoo.  3. After you shampoo, rinse your hair and body thoroughly to remove the shampoo.  4. Use CHG as you would any other liquid soap. You can apply CHG directly to the skin and wash gently with a scrungie or a clean washcloth.   5. Apply the CHG Soap to your body ONLY FROM THE NECK DOWN.  Do not use on open wounds or open sores. Avoid contact with your eyes, ears, mouth and genitals (private parts). Wash genitals (private parts) with your normal soap.  6. Wash thoroughly, paying special attention to the area where your surgery will be performed.  7. Thoroughly rinse your body with warm water from the neck down.  8. DO NOT shower/wash with your normal soap after using and rinsing off the CHG Soap.  9. Pat yourself dry with a CLEAN TOWEL.   10. Wear CLEAN PAJAMAS   11. Place CLEAN SHEETS on your bed the night of your first shower and DO NOT SLEEP WITH PETS.    Day of Surgery: Do not apply any deodorants/lotions. Please wear clean clothes to the hospital/surgery center.  How to Manage Your Diabetes Before and After Surgery  Why is it important to control my blood sugar before and after surgery? . Improving blood sugar levels before and after surgery helps healing and can limit problems. . A way of improving blood sugar control is eating a healthy diet by: o  Eating less sugar and carbohydrates o  Increasing activity/exercise o  Talking with your doctor about reaching your blood sugar goals . High blood sugars (greater than 180 mg/dL) can raise your risk of infections and slow your recovery, so you will need to focus on controlling your diabetes during the weeks before  surgery. . Make sure that the doctor who takes care of your diabetes knows about your planned surgery including the date and location.  How do I manage my blood sugar before surgery? . Check your blood sugar at least 4 times a day, starting 2 days before surgery, to make sure that the level is not too high or low. o Check your blood sugar the morning of your surgery when you wake up and every 2 hours until you get to the Short Stay unit. . If your blood sugar is less than 70 mg/dL, you will need to treat for low blood sugar: o Do not take insulin. o Treat a low blood sugar (less than 70 mg/dL) with  cup of clear juice (cranberry or apple), 4 glucose tablets, OR glucose gel. o Recheck blood sugar in 15 minutes after treatment (to make sure it is greater than 70 mg/dL). If your blood sugar is not greater than 70 mg/dL on recheck, call [phone] for further instructions. . Report your blood sugar to the short stay nurse when you get to Short Stay.  . If you are admitted to the hospital after surgery: o Your blood sugar will be checked by the staff and you will probably be given insulin after surgery (instead of oral diabetes medicines) to make sure you have good blood sugar levels. o The goal for blood sugar control after surgery is 80-180 mg/dL.       WHAT DO I DO ABOUT MY DIABETES MEDICATION?   Marland Kitchen Do not take oral diabetes medicines (pills) the morning of surgery.  . The day of surgery, do not take other diabetes injectables, including Byetta (exenatide), Bydureon (exenatide ER), Victoza (liraglutide), or Trulicity (dulaglutide).   Reviewed and Endorsed by [facility] Patient Education Committee, August 2015 Please read over the following fact sheets that you were given. Pain Booklet, Coughing and Deep Breathing, MRSA Information and Surgical Site Infection Prevention

## 2017-02-16 ENCOUNTER — Encounter (HOSPITAL_COMMUNITY)
Admission: RE | Admit: 2017-02-16 | Discharge: 2017-02-16 | Disposition: A | Discharge status: Home / Self Care | Payer: Medicare Other | Source: Ambulatory Visit | Attending: Neurological Surgery | Admitting: Neurological Surgery

## 2017-02-16 ENCOUNTER — Encounter (HOSPITAL_COMMUNITY): Payer: Self-pay

## 2017-02-16 DIAGNOSIS — M4727 Other spondylosis with radiculopathy, lumbosacral region: Secondary | ICD-10-CM | POA: Insufficient documentation

## 2017-02-16 DIAGNOSIS — Z01812 Encounter for preprocedural laboratory examination: Secondary | ICD-10-CM | POA: Insufficient documentation

## 2017-02-16 HISTORY — DX: Anemia, unspecified: D64.9

## 2017-02-16 HISTORY — DX: Hypothyroidism, unspecified: E03.9

## 2017-02-16 LAB — CBC
HEMATOCRIT: 31.7 % — AB (ref 36.0–46.0)
HEMOGLOBIN: 10.2 g/dL — AB (ref 12.0–15.0)
MCH: 28.7 pg (ref 26.0–34.0)
MCHC: 32.2 g/dL (ref 30.0–36.0)
MCV: 89 fL (ref 78.0–100.0)
Platelets: 227 10*3/uL (ref 150–400)
RBC: 3.56 MIL/uL — ABNORMAL LOW (ref 3.87–5.11)
RDW: 13.3 % (ref 11.5–15.5)
WBC: 5.1 10*3/uL (ref 4.0–10.5)

## 2017-02-16 LAB — BASIC METABOLIC PANEL
ANION GAP: 6 (ref 5–15)
BUN: 36 mg/dL — ABNORMAL HIGH (ref 6–20)
CALCIUM: 9.4 mg/dL (ref 8.9–10.3)
CO2: 23 mmol/L (ref 22–32)
Chloride: 110 mmol/L (ref 101–111)
Creatinine, Ser: 1.65 mg/dL — ABNORMAL HIGH (ref 0.44–1.00)
GFR calc Af Amer: 34 mL/min — ABNORMAL LOW (ref >60)
GFR calc non Af Amer: 30 mL/min — ABNORMAL LOW (ref >60)
GLUCOSE: 113 mg/dL — AB (ref 65–99)
POTASSIUM: 4.6 mmol/L (ref 3.5–5.1)
Sodium: 139 mmol/L (ref 135–145)

## 2017-02-16 LAB — GLUCOSE, CAPILLARY: Glucose-Capillary: 115 mg/dL — ABNORMAL HIGH (ref 65–99)

## 2017-02-16 LAB — SURGICAL PCR SCREEN
MRSA, PCR: NEGATIVE
Staphylococcus aureus: NEGATIVE

## 2017-02-16 NOTE — Pre-Procedure Instructions (Addendum)
VESTER TITSWORTH  02/16/2017      [facility] [address], [place] - [place] [address] Phone: [phone] Fax: [phone]    Your procedure is scheduled on February 24, 2017.  Report to [facility] Admitting at 530 AM.  Call this number if you have problems the morning of surgery:  [phone]   Remember:  Do not eat food or drink liquids after midnight.  Take these medicines the morning of surgery with A SIP OF WATER carvedilol (coreg), clonidine (catapres), levothyroxine (synthroid), eye drops-if needed.  No metformin am of surgery  7 days prior to surgery 02/17/17 tomorrow STOP taking any Aspirin, Aleve, Naproxen, Ibuprofen, Motrin, Advil, Goody's, BC's, all herbal medications, fish oil, and all vitamins   Do not wear jewelry, make-up or nail polish.  Do not wear lotions, powders, or perfumes, or deoderant.  Do not shave 48 hours prior to surgery.  Men may shave face and neck.  Do not bring valuables to the hospital.  [facility] is not responsible for any belongings or valuables.  Contacts, dentures or bridgework may not be worn into surgery.  Leave your suitcase in the car.  After surgery it may be brought to your room.  For patients admitted to the hospital, discharge time will be determined by your treatment team.  Patients discharged the day of surgery will not be allowed to drive home.   Special instructions:  [place]- Preparing For Surgery  Before surgery, you can play an important role. Because skin is not sterile, your skin needs to be as free of germs as possible. You can reduce the number of germs on your skin by washing with CHG (chlorahexidine gluconate) Soap before surgery.  CHG is an antiseptic cleaner which kills germs and bonds with the skin to continue killing germs even after washing.  Please do not use if you have an allergy to CHG or antibacterial soaps. If your skin becomes reddened/irritated stop  using the CHG.  Do not shave (including legs and underarms) for at least 48 hours prior to first CHG shower. It is OK to shave your face.  Please follow these instructions carefully.   1. Shower the NIGHT BEFORE SURGERY and the MORNING OF SURGERY with CHG.   2. If you chose to wash your hair, wash your hair first as usual with your normal shampoo.  3. After you shampoo, rinse your hair and body thoroughly to remove the shampoo.  4. Use CHG as you would any other liquid soap. You can apply CHG directly to the skin and wash gently with a scrungie or a clean washcloth.   5. Apply the CHG Soap to your body ONLY FROM THE NECK DOWN.  Do not use on open wounds or open sores. Avoid contact with your eyes, ears, mouth and genitals (private parts). Wash genitals (private parts) with your normal soap.  6. Wash thoroughly, paying special attention to the area where your surgery will be performed.  7. Thoroughly rinse your body with warm water from the neck down.  8. DO NOT shower/wash with your normal soap after using and rinsing off the CHG Soap.  9. Pat yourself dry with a CLEAN TOWEL.   10. Wear CLEAN PAJAMAS   11. Place CLEAN SHEETS on your bed the night of your first shower and DO NOT SLEEP WITH PETS.    Day of Surgery: Do not apply any deodorants/lotions. Please wear clean clothes  to the hospital/surgery center.       How to Manage Your Diabetes Before and After Surgery  Why is it important to control my blood sugar before and after surgery? . Improving blood sugar levels before and after surgery helps healing and can limit problems. . A way of improving blood sugar control is eating a healthy diet by: o  Eating less sugar and carbohydrates o  Increasing activity/exercise o  Talking with your doctor about reaching your blood sugar goals . High blood sugars (greater than 180 mg/dL) can raise your risk of infections and slow your recovery, so you will need to focus on controlling  your diabetes during the weeks before surgery. . Make sure that the doctor who takes care of your diabetes knows about your planned surgery including the date and location.  How do I manage my blood sugar before surgery? . Check your blood sugar at least 4 times a day, starting 2 days before surgery, to make sure that the level is not too high or low. o Check your blood sugar the morning of your surgery when you wake up and every 2 hours until you get to the Short Stay unit. . If your blood sugar is less than 70 mg/dL, you will need to treat for low blood sugar: o Do not take insulin. o Treat a low blood sugar (less than 70 mg/dL) with  cup of clear juice (cranberry or apple), 4 glucose tablets, OR glucose gel. o Recheck blood sugar in 15 minutes after treatment (to make sure it is greater than 70 mg/dL). If your blood sugar is not greater than 70 mg/dL on recheck, call [phone] for further instructions. . Report your blood sugar to the short stay nurse when you get to Short Stay.  . If you are admitted to the hospital after surgery: o Your blood sugar will be checked by the staff and you will probably be given insulin after surgery (instead of oral diabetes medicines) to make sure you have good blood sugar levels. o The goal for blood sugar control after surgery is 80-180 mg/dL.       WHAT DO I DO ABOUT MY DIABETES MEDICATION?   Marland Kitchen Do not take oral diabetes medicines (pills) the morning of surgery.(metformin)  . The day of surgery, do not take other diabetes injectables, including Byetta (exenatide), Bydureon (exenatide ER), Victoza (liraglutide), or Trulicity (dulaglutide).   Reviewed and Endorsed by [facility] Patient Education Committee, August 2015 Please read over the following fact sheets that you were given. Pain Booklet, Coughing and Deep Breathing, MRSA Information and Surgical Site Infection Prevention

## 2017-02-17 NOTE — Progress Notes (Addendum)
Anesthesia Chart Review:  Pt is a 76 year old female scheduled for L5-S1 ALIF; L3-5 Anterolateral lumbar interbody fusion with removal of coflex;L3-4, L5-S1 Laminectomies with facetectomies for decompression; L4-5 Laminectomy with Gill procedure; L3 to S1 dorsal internal fixation with Mazor Robot on 01/15/2017 with Cyndy Freeze, MD and [name], MD   She is also scheduled for L5-S1 laminectomy with facetectomies for decompression, L4-5 laminectomy with Gill procedure, L3-S1 dorsal internal fixation with Mazor robot on 02/26/2017 with Cyndy Freeze, MD  Surgery was originally scheduled for surgery 10/28/16, but it was postponed due to need for evaluation of poor renal function. It was rescheduled for 01/15/17 but case was cancelled DOS because of uncontrolled HTN.   -  PCP is Hipolito Bayley, MD   - Cardiologist is Lujean Amel, MD  PMH includes: HTN, DM, CKD, disease of pancreas. Never smoker. BMI 41  Medications include: Lipitor, carvedilol, clonidine, iron, Lasix, HCTZ, levothyroxine, losartan, metformin  BP (!) 145/63   Pulse (!) 50   Temp 36.8 C   Resp 20   Ht 5\' 1"  (1.549 m)   Wt 217 lb 3.2 oz (98.5 kg)   SpO2 100%   BMI 41.04 kg/m    Preoperative labs reviewed.  - Glucose 113. HbA1c 5.7 - Cr 1.65, BUN 36; this is consistent with prior results.   1 view CXR 07/06/16: no acute findings.   EKG 09/05/16: Marked sinus bradycardia (41 bpm) with Premature supraventricular complexes. ST & T wave abnormality, consider inferolateral ischemia. Appears stable when compared to EKGs dating back to 03/01/13.   Echo 01/16/17:  - Left ventricle: The cavity size was normal. Wall thickness was increased in a pattern of moderate LVH. Systolic function was normal. The estimated ejection fraction was in the range of 55% to 60%. Wall motion was normal; there were no regional wall motion abnormalities. - Left atrium: The atrium was mildly dilated. - Atrial septum: No defect or  patent foramen ovale was identified.  If no changes, I anticipate pt can proceed with surgery as scheduled.   Willeen Cass, FNP-BC [facility] Short Stay Surgical Center/Anesthesiology Phone: [phone] 02/18/2017 2:43 PM

## 2017-02-18 LAB — HEMOGLOBIN A1C
HEMOGLOBIN A1C: 5.7 % — AB (ref 4.8–5.6)
MEAN PLASMA GLUCOSE: 117 mg/dL

## 2017-02-24 ENCOUNTER — Inpatient Hospital Stay (HOSPITAL_COMMUNITY): Payer: Medicare Other

## 2017-02-24 ENCOUNTER — Inpatient Hospital Stay (HOSPITAL_COMMUNITY): Payer: Medicare Other | Admitting: Anesthesiology

## 2017-02-24 ENCOUNTER — Encounter (HOSPITAL_COMMUNITY): Payer: Self-pay

## 2017-02-24 ENCOUNTER — Inpatient Hospital Stay (HOSPITAL_COMMUNITY): Payer: Medicare Other | Admitting: Emergency Medicine

## 2017-02-24 ENCOUNTER — Inpatient Hospital Stay (HOSPITAL_COMMUNITY)
Admission: RE | Disposition: A | Discharge status: Skilled Nursing Facility | Payer: Self-pay | Source: Ambulatory Visit | Attending: Neurological Surgery

## 2017-02-24 ENCOUNTER — Inpatient Hospital Stay (HOSPITAL_COMMUNITY)
Admission: RE | Admit: 2017-02-24 | Discharge: 2017-03-03 | DRG: 454 | Disposition: A | Discharge status: Skilled Nursing Facility | Payer: Medicare Other | Source: Ambulatory Visit | Attending: Neurological Surgery | Admitting: Neurological Surgery

## 2017-02-24 DIAGNOSIS — I10 Essential (primary) hypertension: Secondary | ICD-10-CM | POA: Diagnosis not present

## 2017-02-24 DIAGNOSIS — Z6841 Body Mass Index (BMI) 40.0 and over, adult: Secondary | ICD-10-CM

## 2017-02-24 DIAGNOSIS — I5030 Unspecified diastolic (congestive) heart failure: Secondary | ICD-10-CM

## 2017-02-24 DIAGNOSIS — Z791 Long term (current) use of non-steroidal anti-inflammatories (NSAID): Secondary | ICD-10-CM

## 2017-02-24 DIAGNOSIS — I129 Hypertensive chronic kidney disease with stage 1 through stage 4 chronic kidney disease, or unspecified chronic kidney disease: Secondary | ICD-10-CM | POA: Diagnosis present

## 2017-02-24 DIAGNOSIS — E119 Type 2 diabetes mellitus without complications: Secondary | ICD-10-CM | POA: Diagnosis not present

## 2017-02-24 DIAGNOSIS — E785 Hyperlipidemia, unspecified: Secondary | ICD-10-CM | POA: Diagnosis present

## 2017-02-24 DIAGNOSIS — Z79899 Other long term (current) drug therapy: Secondary | ICD-10-CM

## 2017-02-24 DIAGNOSIS — N183 Chronic kidney disease, stage 3 unspecified: Secondary | ICD-10-CM | POA: Diagnosis present

## 2017-02-24 DIAGNOSIS — Z9049 Acquired absence of other specified parts of digestive tract: Secondary | ICD-10-CM | POA: Diagnosis not present

## 2017-02-24 DIAGNOSIS — K5900 Constipation, unspecified: Secondary | ICD-10-CM | POA: Diagnosis not present

## 2017-02-24 DIAGNOSIS — Z419 Encounter for procedure for purposes other than remedying health state, unspecified: Secondary | ICD-10-CM | POA: Diagnosis not present

## 2017-02-24 DIAGNOSIS — M47897 Other spondylosis, lumbosacral region: Secondary | ICD-10-CM | POA: Diagnosis present

## 2017-02-24 DIAGNOSIS — Z7984 Long term (current) use of oral hypoglycemic drugs: Secondary | ICD-10-CM | POA: Diagnosis not present

## 2017-02-24 DIAGNOSIS — E039 Hypothyroidism, unspecified: Secondary | ICD-10-CM | POA: Diagnosis present

## 2017-02-24 DIAGNOSIS — M5117 Intervertebral disc disorders with radiculopathy, lumbosacral region: Secondary | ICD-10-CM | POA: Diagnosis present

## 2017-02-24 DIAGNOSIS — Z8249 Family history of ischemic heart disease and other diseases of the circulatory system: Secondary | ICD-10-CM

## 2017-02-24 DIAGNOSIS — D649 Anemia, unspecified: Secondary | ICD-10-CM | POA: Diagnosis present

## 2017-02-24 DIAGNOSIS — Z90710 Acquired absence of both cervix and uterus: Secondary | ICD-10-CM

## 2017-02-24 DIAGNOSIS — E1122 Type 2 diabetes mellitus with diabetic chronic kidney disease: Secondary | ICD-10-CM | POA: Diagnosis present

## 2017-02-24 DIAGNOSIS — N179 Acute kidney failure, unspecified: Secondary | ICD-10-CM | POA: Diagnosis not present

## 2017-02-24 DIAGNOSIS — M48061 Spinal stenosis, lumbar region without neurogenic claudication: Secondary | ICD-10-CM | POA: Diagnosis present

## 2017-02-24 DIAGNOSIS — M4727 Other spondylosis with radiculopathy, lumbosacral region: Secondary | ICD-10-CM | POA: Diagnosis present

## 2017-02-24 DIAGNOSIS — Z833 Family history of diabetes mellitus: Secondary | ICD-10-CM | POA: Diagnosis not present

## 2017-02-24 DIAGNOSIS — Z79890 Hormone replacement therapy (postmenopausal): Secondary | ICD-10-CM

## 2017-02-24 DIAGNOSIS — Z888 Allergy status to other drugs, medicaments and biological substances status: Secondary | ICD-10-CM

## 2017-02-24 DIAGNOSIS — Z9889 Other specified postprocedural states: Secondary | ICD-10-CM

## 2017-02-24 DIAGNOSIS — Z452 Encounter for adjustment and management of vascular access device: Secondary | ICD-10-CM

## 2017-02-24 DIAGNOSIS — E1129 Type 2 diabetes mellitus with other diabetic kidney complication: Secondary | ICD-10-CM | POA: Diagnosis present

## 2017-02-24 DIAGNOSIS — Z9841 Cataract extraction status, right eye: Secondary | ICD-10-CM

## 2017-02-24 DIAGNOSIS — Z91013 Allergy to seafood: Secondary | ICD-10-CM | POA: Diagnosis not present

## 2017-02-24 DIAGNOSIS — M4317 Spondylolisthesis, lumbosacral region: Principal | ICD-10-CM | POA: Diagnosis present

## 2017-02-24 DIAGNOSIS — M5136 Other intervertebral disc degeneration, lumbar region: Secondary | ICD-10-CM

## 2017-02-24 DIAGNOSIS — R5383 Other fatigue: Secondary | ICD-10-CM | POA: Diagnosis present

## 2017-02-24 DIAGNOSIS — M1990 Unspecified osteoarthritis, unspecified site: Secondary | ICD-10-CM | POA: Diagnosis present

## 2017-02-24 DIAGNOSIS — R7989 Other specified abnormal findings of blood chemistry: Secondary | ICD-10-CM | POA: Diagnosis not present

## 2017-02-24 HISTORY — PX: ABDOMINAL EXPOSURE: SHX5708

## 2017-02-24 HISTORY — PX: ANTERIOR LAT LUMBAR FUSION: SHX1168

## 2017-02-24 HISTORY — DX: Other specified postprocedural states: Z98.890

## 2017-02-24 HISTORY — DX: Unspecified osteoarthritis, unspecified site: M19.90

## 2017-02-24 HISTORY — PX: ANTERIOR LUMBAR FUSION: SHX1170

## 2017-02-24 LAB — GLUCOSE, CAPILLARY
GLUCOSE-CAPILLARY: 141 mg/dL — AB (ref 65–99)
GLUCOSE-CAPILLARY: 130 mg/dL — AB (ref 65–99)
GLUCOSE-CAPILLARY: 147 mg/dL — AB (ref 65–99)
Glucose-Capillary: 101 mg/dL — ABNORMAL HIGH (ref 65–99)

## 2017-02-24 LAB — BASIC METABOLIC PANEL
Anion gap: 7 (ref 5–15)
BUN: 31 mg/dL — ABNORMAL HIGH (ref 6–20)
CALCIUM: 9.4 mg/dL (ref 8.9–10.3)
CHLORIDE: 109 mmol/L (ref 101–111)
CO2: 23 mmol/L (ref 22–32)
CREATININE: 1.42 mg/dL — AB (ref 0.44–1.00)
GFR, EST AFRICAN AMERICAN: 41 mL/min — AB (ref >60)
GFR, EST NON AFRICAN AMERICAN: 36 mL/min — AB (ref >60)
Glucose, Bld: 153 mg/dL — ABNORMAL HIGH (ref 65–99)
Potassium: 4 mmol/L (ref 3.5–5.1)
SODIUM: 139 mmol/L (ref 135–145)

## 2017-02-24 LAB — TSH: TSH: 0.14 u[IU]/mL — AB (ref 0.350–4.500)

## 2017-02-24 SURGERY — ANTERIOR LUMBAR FUSION 1 LEVEL
Anesthesia: General

## 2017-02-24 MED ORDER — ONDANSETRON HCL 4 MG PO TABS: 4.0000 mg | ORAL_TABLET | Freq: Four times a day (QID) | ORAL | Status: DC | PRN

## 2017-02-24 MED ORDER — EPHEDRINE SULFATE 50 MG/ML IJ SOLN
INTRAMUSCULAR | Status: DC | PRN
  Administered 2017-02-24 (×2): 5 mg via INTRAVENOUS

## 2017-02-24 MED ORDER — OXYCODONE HCL 5 MG PO TABS: 5.0000 mg | ORAL_TABLET | ORAL | Status: DC | PRN

## 2017-02-24 MED ORDER — METHOCARBAMOL 1000 MG/10ML IJ SOLN: 500.0000 mg | Freq: Four times a day (QID) | INTRAMUSCULAR | Status: DC | PRN

## 2017-02-24 MED ORDER — GABAPENTIN 300 MG PO CAPS: 300.0000 mg | ORAL_CAPSULE | Freq: Three times a day (TID) | ORAL | Status: DC

## 2017-02-24 MED ORDER — SENNA 8.6 MG PO TABS: 1.0000 | ORAL_TABLET | Freq: Two times a day (BID) | ORAL | Status: DC

## 2017-02-24 MED ORDER — DOCUSATE SODIUM 100 MG PO CAPS: 100.0000 mg | ORAL_CAPSULE | Freq: Two times a day (BID) | ORAL | Status: DC

## 2017-02-24 MED ORDER — HYDROMORPHONE HCL 1 MG/ML IJ SOLN
1.0000 mg | INTRAMUSCULAR | Status: DC | PRN
  Administered 2017-02-24: 1 mg via INTRAVENOUS
  Filled 2017-02-24: qty 1

## 2017-02-24 MED ORDER — FLEET ENEMA 7-19 GM/118ML RE ENEM: 1.0000 | ENEMA | Freq: Once | RECTAL | Status: DC | PRN

## 2017-02-24 MED ORDER — OXYCODONE HCL ER 10 MG PO T12A: 10.0000 mg | EXTENDED_RELEASE_TABLET | Freq: Two times a day (BID) | ORAL | Status: DC

## 2017-02-24 MED ORDER — FENTANYL CITRATE (PF) 100 MCG/2ML IJ SOLN
INTRAMUSCULAR | Status: DC | PRN
  Administered 2017-02-24: 50 ug via INTRAVENOUS
  Administered 2017-02-24: 100 ug via INTRAVENOUS
  Administered 2017-02-24 (×7): 50 ug via INTRAVENOUS

## 2017-02-24 MED ORDER — THROMBIN 5000 UNITS EX SOLR
OROMUCOSAL | Status: DC | PRN
  Administered 2017-02-24: 09:00:00 via TOPICAL

## 2017-02-24 MED ORDER — CHLORHEXIDINE GLUCONATE 4 % EX LIQD: 60.0000 mL | Freq: Once | CUTANEOUS | Status: DC

## 2017-02-24 MED ORDER — BISACODYL 10 MG RE SUPP
10.0000 mg | Freq: Every day | RECTAL | Status: DC | PRN
Start: 2017-02-24 — End: 2017-03-03
  Administered 2017-03-01 – 2017-03-03 (×2): 10 mg via RECTAL
  Filled 2017-02-24 (×2): qty 1

## 2017-02-24 MED ORDER — SODIUM CHLORIDE 0.9% FLUSH: 3.0000 mL | Freq: Two times a day (BID) | INTRAVENOUS | Status: DC

## 2017-02-24 MED ORDER — HYDRALAZINE HCL 20 MG/ML IJ SOLN
INTRAMUSCULAR | Status: DC | PRN
  Administered 2017-02-24: 5 mg via INTRAVENOUS

## 2017-02-24 MED ORDER — HYDROMORPHONE HCL 1 MG/ML IJ SOLN: 1.0000 mg | INTRAMUSCULAR | Status: DC | PRN

## 2017-02-24 MED ORDER — ACETAMINOPHEN 325 MG PO TABS
650.0000 mg | ORAL_TABLET | ORAL | Status: DC | PRN
  Administered 2017-02-28: 650 mg via ORAL
  Filled 2017-02-24: qty 2

## 2017-02-24 MED ORDER — SODIUM CHLORIDE 0.9 % IR SOLN
Status: DC | PRN
  Administered 2017-02-24: 09:00:00

## 2017-02-24 MED ORDER — LIDOCAINE-EPINEPHRINE (PF) 2 %-1:200000 IJ SOLN
INTRAMUSCULAR | Status: AC
  Filled 2017-02-24: qty 20

## 2017-02-24 MED ORDER — METHOCARBAMOL 500 MG PO TABS
ORAL_TABLET | ORAL | Status: AC
  Filled 2017-02-24: qty 1

## 2017-02-24 MED ORDER — ONDANSETRON HCL 4 MG/2ML IJ SOLN: 4.0000 mg | Freq: Four times a day (QID) | INTRAMUSCULAR | Status: DC | PRN

## 2017-02-24 MED ORDER — SENNA 8.6 MG PO TABS
1.0000 | ORAL_TABLET | Freq: Two times a day (BID) | ORAL | Status: DC
  Administered 2017-02-24 – 2017-03-03 (×13): 8.6 mg via ORAL
  Filled 2017-02-24 (×13): qty 1

## 2017-02-24 MED ORDER — SENNOSIDES-DOCUSATE SODIUM 8.6-50 MG PO TABS: 1.0000 | ORAL_TABLET | Freq: Every evening | ORAL | Status: DC | PRN

## 2017-02-24 MED ORDER — CHLORHEXIDINE GLUCONATE CLOTH 2 % EX PADS: 6.0000 | MEDICATED_PAD | Freq: Once | CUTANEOUS | Status: DC

## 2017-02-24 MED ORDER — FENTANYL CITRATE (PF) 250 MCG/5ML IJ SOLN
INTRAMUSCULAR | Status: AC
  Filled 2017-02-24: qty 5

## 2017-02-24 MED ORDER — OXYCODONE HCL ER 10 MG PO T12A
10.0000 mg | EXTENDED_RELEASE_TABLET | Freq: Two times a day (BID) | ORAL | Status: DC
  Administered 2017-02-24 – 2017-02-28 (×7): 10 mg via ORAL
  Filled 2017-02-24 (×8): qty 1

## 2017-02-24 MED ORDER — PROPOFOL 10 MG/ML IV BOLUS
INTRAVENOUS | Status: AC
  Filled 2017-02-24: qty 40

## 2017-02-24 MED ORDER — METHOCARBAMOL 500 MG PO TABS
500.0000 mg | ORAL_TABLET | Freq: Four times a day (QID) | ORAL | Status: DC | PRN
  Administered 2017-02-24 – 2017-03-03 (×3): 500 mg via ORAL
  Filled 2017-02-24 (×2): qty 1

## 2017-02-24 MED ORDER — GLYCOPYRROLATE 0.2 MG/ML IJ SOLN
INTRAMUSCULAR | Status: DC | PRN
  Administered 2017-02-24 (×3): 0.2 mg via INTRAVENOUS

## 2017-02-24 MED ORDER — MIDAZOLAM HCL 2 MG/2ML IJ SOLN
INTRAMUSCULAR | Status: AC
  Filled 2017-02-24: qty 2

## 2017-02-24 MED ORDER — ACETAMINOPHEN 650 MG RE SUPP: 650.0000 mg | RECTAL | Status: DC | PRN

## 2017-02-24 MED ORDER — SODIUM CHLORIDE 0.9 % IV SOLN
INTRAVENOUS | Status: DC
  Administered 2017-02-26: 11:00:00 via INTRAVENOUS

## 2017-02-24 MED ORDER — DEXTROSE 5 % IV SOLN
500.0000 mg | Freq: Four times a day (QID) | INTRAVENOUS | Status: DC | PRN
  Filled 2017-02-24: qty 5

## 2017-02-24 MED ORDER — LACTATED RINGERS IV SOLN: INTRAVENOUS | Status: DC | PRN

## 2017-02-24 MED ORDER — ACETAMINOPHEN 325 MG PO TABS: 650.0000 mg | ORAL_TABLET | ORAL | Status: DC | PRN

## 2017-02-24 MED ORDER — LIDOCAINE-EPINEPHRINE 2 %-1:100000 IJ SOLN
INTRAMUSCULAR | Status: DC | PRN
  Administered 2017-02-24: 11 mL via INTRADERMAL

## 2017-02-24 MED ORDER — ROCURONIUM BROMIDE 10 MG/ML (PF) SYRINGE
PREFILLED_SYRINGE | INTRAVENOUS | Status: AC
  Filled 2017-02-24: qty 10

## 2017-02-24 MED ORDER — ONDANSETRON HCL 4 MG/2ML IJ SOLN
INTRAMUSCULAR | Status: DC | PRN
  Administered 2017-02-24: 4 mg via INTRAVENOUS

## 2017-02-24 MED ORDER — DEXAMETHASONE SODIUM PHOSPHATE 10 MG/ML IJ SOLN
INTRAMUSCULAR | Status: DC | PRN
  Administered 2017-02-24: 10 mg via INTRAVENOUS

## 2017-02-24 MED ORDER — LEVOTHYROXINE SODIUM 100 MCG PO TABS
100.0000 ug | ORAL_TABLET | Freq: Every day | ORAL | Status: DC
  Administered 2017-02-25 – 2017-02-27 (×2): 100 ug via ORAL
  Filled 2017-02-24 (×3): qty 1

## 2017-02-24 MED ORDER — OXYCODONE HCL 5 MG PO TABS
5.0000 mg | ORAL_TABLET | ORAL | Status: DC | PRN
  Administered 2017-02-24 (×2): 10 mg via ORAL
  Administered 2017-02-25 (×2): 5 mg via ORAL
  Administered 2017-02-27: 10 mg via ORAL
  Filled 2017-02-24 (×3): qty 2

## 2017-02-24 MED ORDER — PHENOL 1.4 % MT LIQD: 1.0000 | OROMUCOSAL | Status: DC | PRN

## 2017-02-24 MED ORDER — ACETAMINOPHEN 500 MG PO TABS: 1000.0000 mg | ORAL_TABLET | Freq: Four times a day (QID) | ORAL | Status: DC

## 2017-02-24 MED ORDER — LABETALOL HCL 5 MG/ML IV SOLN
5.0000 mg | INTRAVENOUS | Status: DC | PRN
  Filled 2017-02-24: qty 4

## 2017-02-24 MED ORDER — SENNOSIDES-DOCUSATE SODIUM 8.6-50 MG PO TABS
1.0000 | ORAL_TABLET | Freq: Every evening | ORAL | Status: DC | PRN
  Administered 2017-03-03: 1 via ORAL
  Filled 2017-02-24: qty 1

## 2017-02-24 MED ORDER — SODIUM CHLORIDE 0.9% FLUSH: 3.0000 mL | INTRAVENOUS | Status: DC | PRN

## 2017-02-24 MED ORDER — LACTATED RINGERS IV SOLN
INTRAVENOUS | Status: DC | PRN
  Administered 2017-02-24 (×2): via INTRAVENOUS

## 2017-02-24 MED ORDER — CARVEDILOL 25 MG PO TABS
25.0000 mg | ORAL_TABLET | Freq: Two times a day (BID) | ORAL | Status: DC
  Administered 2017-02-25 – 2017-03-03 (×12): 25 mg via ORAL
  Filled 2017-02-24 (×13): qty 1

## 2017-02-24 MED ORDER — ZOLPIDEM TARTRATE 5 MG PO TABS: 5.0000 mg | ORAL_TABLET | Freq: Every evening | ORAL | Status: DC | PRN

## 2017-02-24 MED ORDER — GABAPENTIN 300 MG PO CAPS
300.0000 mg | ORAL_CAPSULE | Freq: Three times a day (TID) | ORAL | Status: DC
  Administered 2017-02-24 – 2017-02-27 (×9): 300 mg via ORAL
  Filled 2017-02-24 (×9): qty 1

## 2017-02-24 MED ORDER — CEFAZOLIN SODIUM-DEXTROSE 2-4 GM/100ML-% IV SOLN
2.0000 g | Freq: Once | INTRAVENOUS | Status: AC
  Administered 2017-02-24: 2 g via INTRAVENOUS
  Filled 2017-02-24: qty 100

## 2017-02-24 MED ORDER — THROMBIN 5000 UNITS EX SOLR
CUTANEOUS | Status: AC
  Filled 2017-02-24: qty 10000

## 2017-02-24 MED ORDER — BUPIVACAINE-EPINEPHRINE (PF) 0.5% -1:200000 IJ SOLN
INTRAMUSCULAR | Status: AC
  Filled 2017-02-24: qty 30

## 2017-02-24 MED ORDER — LABETALOL HCL 5 MG/ML IV SOLN
INTRAVENOUS | Status: AC
  Filled 2017-02-24: qty 4

## 2017-02-24 MED ORDER — BUPIVACAINE HCL (PF) 0.5 % IJ SOLN
INTRAMUSCULAR | Status: AC
  Filled 2017-02-24: qty 30

## 2017-02-24 MED ORDER — BISACODYL 10 MG RE SUPP: 10.0000 mg | Freq: Every day | RECTAL | Status: DC | PRN

## 2017-02-24 MED ORDER — PROPOFOL 500 MG/50ML IV EMUL
INTRAVENOUS | Status: DC | PRN
  Administered 2017-02-24: 100 ug/kg/min via INTRAVENOUS

## 2017-02-24 MED ORDER — BUPIVACAINE-EPINEPHRINE 0.5% -1:200000 IJ SOLN
INTRAMUSCULAR | Status: DC | PRN
  Administered 2017-02-24: 11 mL

## 2017-02-24 MED ORDER — FUROSEMIDE 20 MG PO TABS
20.0000 mg | ORAL_TABLET | Freq: Every day | ORAL | Status: DC
  Administered 2017-02-25 – 2017-02-27 (×2): 20 mg via ORAL
  Filled 2017-02-24 (×2): qty 1

## 2017-02-24 MED ORDER — DOCUSATE SODIUM 100 MG PO CAPS
100.0000 mg | ORAL_CAPSULE | Freq: Two times a day (BID) | ORAL | Status: DC
  Administered 2017-02-24 – 2017-03-03 (×13): 100 mg via ORAL
  Filled 2017-02-24 (×13): qty 1

## 2017-02-24 MED ORDER — PROPOFOL 10 MG/ML IV BOLUS
INTRAVENOUS | Status: DC | PRN
  Administered 2017-02-24: 30 mg via INTRAVENOUS
  Administered 2017-02-24: 70 mg via INTRAVENOUS
  Administered 2017-02-24: 50 mg via INTRAVENOUS
  Administered 2017-02-24: 20 mg via INTRAVENOUS
  Administered 2017-02-24: 30 mg via INTRAVENOUS
  Administered 2017-02-24: 150 mg via INTRAVENOUS
  Administered 2017-02-24: 50 mg via INTRAVENOUS

## 2017-02-24 MED ORDER — SODIUM CHLORIDE 0.9 % IV SOLN
INTRAVENOUS | Status: DC
  Administered 2017-02-24 – 2017-03-01 (×3): via INTRAVENOUS

## 2017-02-24 MED ORDER — CEFAZOLIN SODIUM-DEXTROSE 2-4 GM/100ML-% IV SOLN
2.0000 g | INTRAVENOUS | Status: AC
  Administered 2017-02-24: 2 g via INTRAVENOUS
  Filled 2017-02-24: qty 100

## 2017-02-24 MED ORDER — INSULIN ASPART 100 UNIT/ML ~~LOC~~ SOLN
0.0000 [IU] | Freq: Three times a day (TID) | SUBCUTANEOUS | Status: DC
Start: 2017-02-24 — End: 2017-03-03
  Administered 2017-02-25 – 2017-03-01 (×6): 2 [IU] via SUBCUTANEOUS
  Administered 2017-03-01: 3 [IU] via SUBCUTANEOUS
  Administered 2017-03-01 – 2017-03-02 (×2): 2 [IU] via SUBCUTANEOUS

## 2017-02-24 MED ORDER — MENTHOL 3 MG MT LOZG: 1.0000 | LOZENGE | OROMUCOSAL | Status: DC | PRN

## 2017-02-24 MED ORDER — METHOCARBAMOL 500 MG PO TABS: 500.0000 mg | ORAL_TABLET | Freq: Four times a day (QID) | ORAL | Status: DC | PRN

## 2017-02-24 MED ORDER — LIDOCAINE HCL (CARDIAC) 20 MG/ML IV SOLN
INTRAVENOUS | Status: DC | PRN
  Administered 2017-02-24: 60 mg via INTRAVENOUS

## 2017-02-24 MED ORDER — OXYCODONE-ACETAMINOPHEN 7.5-325 MG PO TABS
1.0000 | ORAL_TABLET | ORAL | Status: DC | PRN
  Filled 2017-02-24: qty 1

## 2017-02-24 MED ORDER — ATORVASTATIN CALCIUM 40 MG PO TABS
40.0000 mg | ORAL_TABLET | Freq: Every evening | ORAL | Status: DC
  Administered 2017-02-24 – 2017-03-02 (×7): 40 mg via ORAL
  Filled 2017-02-24 (×7): qty 1

## 2017-02-24 MED ORDER — OXYCODONE HCL 5 MG PO TABS
ORAL_TABLET | ORAL | Status: AC
  Filled 2017-02-24: qty 2

## 2017-02-24 MED ORDER — PHENYLEPHRINE 40 MCG/ML (10ML) SYRINGE FOR IV PUSH (FOR BLOOD PRESSURE SUPPORT)
PREFILLED_SYRINGE | INTRAVENOUS | Status: AC
  Filled 2017-02-24: qty 10

## 2017-02-24 MED ORDER — CLONIDINE HCL 0.1 MG PO TABS
0.2000 mg | ORAL_TABLET | Freq: Two times a day (BID) | ORAL | Status: DC
  Administered 2017-02-25 – 2017-03-03 (×12): 0.2 mg via ORAL
  Filled 2017-02-24 (×13): qty 2

## 2017-02-24 MED ORDER — ACETAMINOPHEN 500 MG PO TABS
1000.0000 mg | ORAL_TABLET | Freq: Four times a day (QID) | ORAL | Status: AC
  Administered 2017-02-24 – 2017-02-25 (×3): 1000 mg via ORAL
  Filled 2017-02-24 (×3): qty 2

## 2017-02-24 MED ORDER — CEFAZOLIN SODIUM-DEXTROSE 2-4 GM/100ML-% IV SOLN: 2.0000 g | Freq: Three times a day (TID) | INTRAVENOUS | Status: DC

## 2017-02-24 MED ORDER — SODIUM CHLORIDE 0.9 % IV SOLN: 250.0000 mL | INTRAVENOUS | Status: DC

## 2017-02-24 MED ORDER — MENTHOL 3 MG MT LOZG
1.0000 | LOZENGE | OROMUCOSAL | Status: DC | PRN
  Administered 2017-02-25: 3 mg via ORAL
  Filled 2017-02-24 (×2): qty 9

## 2017-02-24 MED ORDER — HEMOSTATIC AGENTS (NO CHARGE) OPTIME
TOPICAL | Status: DC | PRN
  Administered 2017-02-24: 1 via TOPICAL

## 2017-02-24 MED ORDER — THROMBIN 5000 UNITS EX SOLR
CUTANEOUS | Status: DC | PRN
  Administered 2017-02-24: 10000 [IU] via TOPICAL

## 2017-02-24 MED ORDER — 0.9 % SODIUM CHLORIDE (POUR BTL) OPTIME
TOPICAL | Status: DC | PRN
  Administered 2017-02-24: 1000 mL

## 2017-02-24 MED ORDER — ONDANSETRON HCL 4 MG/2ML IJ SOLN
4.0000 mg | Freq: Four times a day (QID) | INTRAMUSCULAR | Status: DC | PRN
  Administered 2017-02-25: 4 mg via INTRAVENOUS
  Filled 2017-02-24: qty 2

## 2017-02-24 MED ORDER — BUPIVACAINE HCL 0.5 % IJ SOLN
INTRAMUSCULAR | Status: DC | PRN
  Administered 2017-02-24: 2 mL

## 2017-02-24 MED ORDER — SODIUM CHLORIDE 0.9% FLUSH
3.0000 mL | Freq: Two times a day (BID) | INTRAVENOUS | Status: DC
  Administered 2017-02-24 – 2017-02-27 (×6): 3 mL via INTRAVENOUS

## 2017-02-24 MED ORDER — INSULIN ASPART 100 UNIT/ML ~~LOC~~ SOLN
0.0000 [IU] | Freq: Every day | SUBCUTANEOUS | Status: DC
Start: 2017-02-24 — End: 2017-03-03

## 2017-02-24 MED ORDER — SUGAMMADEX SODIUM 200 MG/2ML IV SOLN
INTRAVENOUS | Status: DC | PRN
  Administered 2017-02-24: 196.8 mg via INTRAVENOUS

## 2017-02-24 MED ORDER — HYDRALAZINE HCL 20 MG/ML IJ SOLN
INTRAMUSCULAR | Status: AC
  Filled 2017-02-24: qty 1

## 2017-02-24 MED ORDER — HYDROCHLOROTHIAZIDE 25 MG PO TABS
25.0000 mg | ORAL_TABLET | Freq: Every day | ORAL | Status: DC
  Administered 2017-02-25 – 2017-02-27 (×2): 25 mg via ORAL
  Filled 2017-02-24 (×2): qty 1

## 2017-02-24 MED ORDER — DEXAMETHASONE SODIUM PHOSPHATE 10 MG/ML IJ SOLN
INTRAMUSCULAR | Status: AC
  Filled 2017-02-24: qty 1

## 2017-02-24 MED ORDER — LACTATED RINGERS IV SOLN
INTRAVENOUS | Status: DC | PRN
Start: 2017-02-24 — End: 2017-02-24
  Administered 2017-02-24 (×2): via INTRAVENOUS

## 2017-02-24 MED ORDER — LIDOCAINE 2% (20 MG/ML) 5 ML SYRINGE
INTRAMUSCULAR | Status: AC
  Filled 2017-02-24: qty 5

## 2017-02-24 MED ORDER — THROMBIN 5000 UNITS EX SOLR
CUTANEOUS | Status: AC
  Filled 2017-02-24: qty 15000

## 2017-02-24 MED ORDER — FENTANYL CITRATE (PF) 100 MCG/2ML IJ SOLN: 25.0000 ug | INTRAMUSCULAR | Status: DC | PRN

## 2017-02-24 MED ORDER — ROCURONIUM BROMIDE 100 MG/10ML IV SOLN
INTRAVENOUS | Status: DC | PRN
  Administered 2017-02-24: 20 mg via INTRAVENOUS
  Administered 2017-02-24: 50 mg via INTRAVENOUS

## 2017-02-24 MED ORDER — ONDANSETRON HCL 4 MG/2ML IJ SOLN
INTRAMUSCULAR | Status: AC
  Filled 2017-02-24: qty 2

## 2017-02-24 MED ORDER — LOSARTAN POTASSIUM 50 MG PO TABS
100.0000 mg | ORAL_TABLET | Freq: Every day | ORAL | Status: DC
  Administered 2017-02-25 – 2017-02-27 (×2): 100 mg via ORAL
  Filled 2017-02-24 (×3): qty 2

## 2017-02-24 MED FILL — Sodium Chloride IV Soln 0.9%: INTRAVENOUS | Qty: 1000 | Status: AC

## 2017-02-24 MED FILL — Heparin Sodium (Porcine) Inj 1000 Unit/ML: INTRAMUSCULAR | Qty: 30 | Status: AC

## 2017-02-24 SURGICAL SUPPLY — 138 items
APPLIER CLIP 11 MED OPEN (CLIP)
APPLIER CLIP 9.375 MED OPEN (MISCELLANEOUS) ×2
BATTALION LLIF ITRADISCAL SHIM (MISCELLANEOUS) ×2
BIT DRILL NEURO 2X3.1 SFT TUCH (MISCELLANEOUS) IMPLANT
BLADE CLIPPER SURG (BLADE) IMPLANT
BUR BARREL STRAIGHT FLUTE 4.0 (BURR) IMPLANT
BUR ROUND FLUTED 5 RND (BURR) IMPLANT
CABLE BATTALION LLIF LIGHT (MISCELLANEOUS) ×2 IMPLANT
CANISTER SUCT 3000ML PPV (MISCELLANEOUS) ×2 IMPLANT
CARTRIDGE OIL MAESTRO DRILL (MISCELLANEOUS) IMPLANT
CHLORAPREP W/TINT 26ML (MISCELLANEOUS) ×4 IMPLANT
CLIP APPLIE 11 MED OPEN (CLIP) IMPLANT
CLIP APPLIE 9.375 MED OPEN (MISCELLANEOUS) ×1 IMPLANT
COUNTER NEEDLE 20 DBL MAG RED (NEEDLE) ×2 IMPLANT
COVER BACK TABLE 24X17X13 BIG (DRAPES) IMPLANT
COVER BACK TABLE 60X90IN (DRAPES) ×2 IMPLANT
DECANTER SPIKE VIAL GLASS SM (MISCELLANEOUS) ×4 IMPLANT
DERMABOND ADVANCED (GAUZE/BANDAGES/DRESSINGS) ×2
DERMABOND ADVANCED .7 DNX12 (GAUZE/BANDAGES/DRESSINGS) ×2 IMPLANT
DIFFUSER DRILL AIR PNEUMATIC (MISCELLANEOUS) IMPLANT
DILATOR INSULATED XL 8X13 (MISCELLANEOUS) ×2 IMPLANT
DISSECTOR BLUNT TIP ENDO 5MM (MISCELLANEOUS) IMPLANT
DRAIN SNY WOU 7FLT (WOUND CARE) ×2 IMPLANT
DRAPE C-ARM 42X72 X-RAY (DRAPES) ×4 IMPLANT
DRAPE C-ARMOR (DRAPES) ×4 IMPLANT
DRAPE INCISE IOBAN 66X45 STRL (DRAPES) IMPLANT
DRAPE LAPAROTOMY 100X72X124 (DRAPES) ×2 IMPLANT
DRAPE POUCH INSTRU U-SHP 10X18 (DRAPES) ×4 IMPLANT
DRILL NEURO 2X3.1 SOFT TOUCH (MISCELLANEOUS)
DRSG OPSITE POSTOP 3X4 (GAUZE/BANDAGES/DRESSINGS) ×4 IMPLANT
DRSG OPSITE POSTOP 4X8 (GAUZE/BANDAGES/DRESSINGS) ×2 IMPLANT
ELECT BLADE 4.0 EZ CLEAN MEGAD (MISCELLANEOUS) ×2
ELECT BLADE 6.5 EXT (BLADE) ×2 IMPLANT
ELECT REM PT RETURN 9FT ADLT (ELECTROSURGICAL) ×4
ELECTRODE BLDE 4.0 EZ CLN MEGD (MISCELLANEOUS) ×1 IMPLANT
ELECTRODE REM PT RTRN 9FT ADLT (ELECTROSURGICAL) ×2 IMPLANT
EVACUATOR SILICONE 100CC (DRAIN) ×2 IMPLANT
FEE INTRAOP MONITOR IMPULS NCS (MISCELLANEOUS) ×1 IMPLANT
GAUZE SPONGE 4X4 12PLY STRL (GAUZE/BANDAGES/DRESSINGS) IMPLANT
GAUZE SPONGE 4X4 16PLY XRAY LF (GAUZE/BANDAGES/DRESSINGS) IMPLANT
GLOVE BIO SURGEON STRL SZ7 (GLOVE) ×2 IMPLANT
GLOVE BIO SURGEON STRL SZ7.5 (GLOVE) ×2 IMPLANT
GLOVE BIO SURGEON STRL SZ8 (GLOVE) ×2 IMPLANT
GLOVE BIOGEL PI IND STRL 7.0 (GLOVE) ×1 IMPLANT
GLOVE BIOGEL PI IND STRL 7.5 (GLOVE) ×2 IMPLANT
GLOVE BIOGEL PI IND STRL 8 (GLOVE) ×1 IMPLANT
GLOVE BIOGEL PI IND STRL 8.5 (GLOVE) ×1 IMPLANT
GLOVE BIOGEL PI INDICATOR 7.0 (GLOVE) ×1
GLOVE BIOGEL PI INDICATOR 7.5 (GLOVE) ×2
GLOVE BIOGEL PI INDICATOR 8 (GLOVE) ×1
GLOVE BIOGEL PI INDICATOR 8.5 (GLOVE) ×1
GLOVE ECLIPSE 7.5 STRL STRAW (GLOVE) ×2 IMPLANT
GLOVE EXAM NITRILE LRG STRL (GLOVE) IMPLANT
GLOVE EXAM NITRILE XL STR (GLOVE) IMPLANT
GLOVE EXAM NITRILE XS STR PU (GLOVE) IMPLANT
GLOVE INDICATOR 7.5 STRL GRN (GLOVE) ×2 IMPLANT
GLOVE SS BIOGEL STRL SZ 7.5 (GLOVE) ×3 IMPLANT
GLOVE SUPERSENSE BIOGEL SZ 7.5 (GLOVE) ×3
GOWN STRL REUS W/ TWL LRG LVL3 (GOWN DISPOSABLE) ×4 IMPLANT
GOWN STRL REUS W/ TWL XL LVL3 (GOWN DISPOSABLE) ×1 IMPLANT
GOWN STRL REUS W/TWL 2XL LVL3 (GOWN DISPOSABLE) IMPLANT
GOWN STRL REUS W/TWL LRG LVL3 (GOWN DISPOSABLE) ×4
GOWN STRL REUS W/TWL XL LVL3 (GOWN DISPOSABLE) ×1
GUIDEWIRE 320MM BLUNT TIP (WIRE) ×2 IMPLANT
HEMOSTAT POWDER KIT SURGIFOAM (HEMOSTASIS) ×2 IMPLANT
INSERT FOGARTY 61MM (MISCELLANEOUS) IMPLANT
INSERT FOGARTY SM (MISCELLANEOUS) IMPLANT
INTRAOP MONITOR FEE IMPULS NCS (MISCELLANEOUS) ×1
INTRAOP MONITOR FEE IMPULSE (MISCELLANEOUS) ×1
KIT BASIN OR (CUSTOM PROCEDURE TRAY) ×2 IMPLANT
KIT INFUSE X SMALL 1.4CC (Orthopedic Implant) ×2 IMPLANT
KIT INFUSE XX SMALL 0.7CC (Orthopedic Implant) ×2 IMPLANT
KIT ROOM TURNOVER OR (KITS) ×4 IMPLANT
LEAD WIRE MULTI STAGE DISP (NEUROSURGERY SUPPLIES) ×2 IMPLANT
LOOP VESSEL MAXI BLUE (MISCELLANEOUS) IMPLANT
LOOP VESSEL MINI RED (MISCELLANEOUS) IMPLANT
MARKER SKIN DUAL TIP RULER LAB (MISCELLANEOUS) ×2 IMPLANT
NEEDLE HYPO 21X1.5 SAFETY (NEEDLE) ×2 IMPLANT
NEEDLE HYPO 25X1 1.5 SAFETY (NEEDLE) ×2 IMPLANT
NEEDLE SPNL 18GX3.5 QUINCKE PK (NEEDLE) ×2 IMPLANT
NS IRRIG 1000ML POUR BTL (IV SOLUTION) ×2 IMPLANT
OIL CARTRIDGE MAESTRO DRILL (MISCELLANEOUS)
PACK LAMINECTOMY NEURO (CUSTOM PROCEDURE TRAY) ×4 IMPLANT
PACK UNIVERSAL I (CUSTOM PROCEDURE TRAY) ×2 IMPLANT
PAD ARMBOARD 7.5X6 YLW CONV (MISCELLANEOUS) ×4 IMPLANT
PLATE ALIF SPINAL 12MM (Plate) ×2 IMPLANT
PROBE BALL TIP STIM 200MM 2.3M (NEUROSURGERY SUPPLIES) ×2 IMPLANT
PUTTY DBM GRAFTON 5CC (Putty) ×4 IMPLANT
SCREW 25MM (Screw) ×4 IMPLANT
SCREW BN 25X6XNS SPNE ALIF (Screw) ×4 IMPLANT
SHIM ITRADISCAL BATTALION LLIF (MISCELLANEOUS) ×1 IMPLANT
SPACER BATTALION 6D 18X8X50MM (Spacer) ×2 IMPLANT
SPACER BATTALION 6D 18X8X55MM (Spacer) ×2 IMPLANT
SPACER LUMBAR NOVEL 10  SM 12D (Spacer) ×1 IMPLANT
SPACER LUMBAR NOVEL 10 SM 12D (Spacer) ×1 IMPLANT
SPONGE INTESTINAL PEANUT (DISPOSABLE) ×6 IMPLANT
SPONGE LAP 18X18 X RAY DECT (DISPOSABLE) ×2 IMPLANT
SPONGE LAP 4X18 X RAY DECT (DISPOSABLE) IMPLANT
SPONGE SURGIFOAM ABS GEL SZ50 (HEMOSTASIS) ×2 IMPLANT
STAPLER VISISTAT (STAPLE) IMPLANT
STAPLER VISISTAT 35W (STAPLE) ×2 IMPLANT
SUT PROLENE 4 0 RB 1 (SUTURE)
SUT PROLENE 4-0 RB1 .5 CRCL 36 (SUTURE) IMPLANT
SUT PROLENE 5 0 CC1 (SUTURE) IMPLANT
SUT PROLENE 6 0 C 1 30 (SUTURE) IMPLANT
SUT PROLENE 6 0 CC (SUTURE) IMPLANT
SUT SILK 0 TIES 10X30 (SUTURE) ×2 IMPLANT
SUT SILK 2 0 TIES 10X30 (SUTURE) ×2 IMPLANT
SUT SILK 2 0 TIES 17X18 (SUTURE)
SUT SILK 2 0SH CR/8 30 (SUTURE) IMPLANT
SUT SILK 2-0 18XBRD TIE BLK (SUTURE) IMPLANT
SUT SILK 3 0 TIES 10X30 (SUTURE) ×2 IMPLANT
SUT SILK 3 0SH CR/8 30 (SUTURE) IMPLANT
SUT STRATAFIX 1PDS 45CM VIOLET (SUTURE) ×2 IMPLANT
SUT STRATAFIX MNCRL+ 3-0 PS-2 (SUTURE) ×1
SUT STRATAFIX MONOCRYL 3-0 (SUTURE) ×1
SUT STRATAFIX SPIRAL + 2-0 (SUTURE) IMPLANT
SUT VIC AB 0 CT1 18XCR BRD8 (SUTURE) ×1 IMPLANT
SUT VIC AB 0 CT1 27 (SUTURE)
SUT VIC AB 0 CT1 27XBRD ANBCTR (SUTURE) IMPLANT
SUT VIC AB 0 CT1 8-18 (SUTURE) ×1
SUT VIC AB 1 CT1 18XBRD ANBCTR (SUTURE) IMPLANT
SUT VIC AB 1 CT1 8-18 (SUTURE)
SUT VIC AB 2-0 CT1 18 (SUTURE) ×4 IMPLANT
SUT VIC AB 2-0 CT1 27 (SUTURE)
SUT VIC AB 2-0 CT1 27XBRD (SUTURE) IMPLANT
SUT VIC AB 2-0 CTB1 (SUTURE) IMPLANT
SUT VIC AB 3-0 SH 27 (SUTURE) ×1
SUT VIC AB 3-0 SH 27X BRD (SUTURE) ×1 IMPLANT
SUT VIC AB 3-0 SH 8-18 (SUTURE) ×2 IMPLANT
SUT VIC AB 4-0 PS2 27 (SUTURE) IMPLANT
SUTURE STRATFX MNCRL+ 3-0 PS-2 (SUTURE) ×1 IMPLANT
SYR 30ML LL (SYRINGE) ×2 IMPLANT
TOWEL GREEN STERILE (TOWEL DISPOSABLE) ×2 IMPLANT
TOWEL GREEN STERILE FF (TOWEL DISPOSABLE) ×2 IMPLANT
TRAP SPECIMEN MUCOUS 40CC (MISCELLANEOUS) IMPLANT
TRAY FOLEY W/METER SILVER 16FR (SET/KITS/TRAYS/PACK) ×2 IMPLANT
WATER STERILE IRR 1000ML POUR (IV SOLUTION) ×2 IMPLANT

## 2017-02-24 NOTE — Anesthesia Preprocedure Evaluation (Addendum)
Anesthesia Evaluation  Patient identified by MRN, date of birth, ID band Patient awake    Reviewed: Allergy & Precautions, NPO status , Patient's Chart, lab work & pertinent test results  Airway Mallampati: II  TM Distance: >3 FB     Dental   Pulmonary neg pulmonary ROS,    breath sounds clear to auscultation       Cardiovascular hypertension,  Rhythm:Regular Rate:Normal     Neuro/Psych negative neurological ROS     GI/Hepatic negative GI ROS, Neg liver ROS,   Endo/Other  diabetesHypothyroidism   Renal/GU Renal disease     Musculoskeletal  (+) Arthritis ,   Abdominal   Peds  Hematology  (+) anemia ,   Anesthesia Other Findings   Reproductive/Obstetrics                            Anesthesia Physical Anesthesia Plan  ASA: III  Anesthesia Plan: General   Post-op Pain Management:    Induction: Intravenous  PONV Risk Score and Plan: 3 and Ondansetron, Dexamethasone, Propofol, Midazolam and Treatment may vary due to age or medical condition  Airway Management Planned:   Additional Equipment:   Intra-op Plan:   Post-operative Plan: Possible Post-op intubation/ventilation  Informed Consent: I have reviewed the patients History and Physical, chart, labs and discussed the procedure including the risks, benefits and alternatives for the proposed anesthesia with the patient or authorized representative who has indicated his/her understanding and acceptance.   Dental advisory given  Plan Discussed with: CRNA, Anesthesiologist and Surgeon  Anesthesia Plan Comments:         Anesthesia Quick Evaluation

## 2017-02-24 NOTE — Anesthesia Procedure Notes (Signed)
Procedure Name: Intubation Date/Time: 02/24/2017 8:03 AM Performed by: Scheryl Darter Pre-anesthesia Checklist: Patient identified, Emergency Drugs available, Suction available and Patient being monitored Patient Re-evaluated:Patient Re-evaluated prior to induction Oxygen Delivery Method: Circle System Utilized Preoxygenation: Pre-oxygenation with 100% oxygen Induction Type: IV induction Ventilation: Mask ventilation without difficulty Grade View: Grade I Tube type: Oral Tube size: 7.5 mm Number of attempts: 1 Airway Equipment and Method: Stylet and Oral airway Placement Confirmation: ETT inserted through vocal cords under direct vision,  positive ETCO2 and breath sounds checked- equal and bilateral Tube secured with: Tape Dental Injury: Teeth and Oropharynx as per pre-operative assessment

## 2017-02-24 NOTE — Progress Notes (Signed)
Dr. Gennie Alma from anesthesia made aware of the pt's bp 207/59, 188/52, and 196/57.

## 2017-02-24 NOTE — Op Note (Signed)
    NAME: Taylor Tyler    MRN: [id number] DOB: 1940/12/18    DATE OF OPERATION: 02/24/2017  PREOP DIAGNOSIS:    Degenerative disc disease L5-S1  POSTOP DIAGNOSIS:    Same  PROCEDURE:    ANTERIOR RETROPERITONEAL EXPOSURE OF L5-S1   EXPOSURE SURGEON: Judeth Cornfield. Scot Dock, MD  SPINE SURGEON: Cyndy Freeze, MD  ANESTHESIA: Gen.   EBL: Minimal  INDICATIONS:    Taylor Tyler is a 76 y.o. female with significant degenerative disc disease L5-S1. I was asked to provide anterior retroperitoneal exposure of L5-S1.  FINDINGS:    Because of multiple previous lower abdominal operations, it was significant scar tissue. The rectus abdominis muscle was poorly defined. The iliac artery on the left did not have significant atherosclerotic disease.  TECHNIQUE:    The patient was taken to the operating room and received general anesthetic. Monitoring lines had been placed by anesthesia. Her pannus was taped superiorly. The level of the L5-S1 disc was marked under fluoroscopy. The abdomen was then prepped and draped in the usual sterile fashion. A transverse incision was made to the left of the midline at the marked level. The dissection was carried down through the subcutaneous cutaneous tissue to the anterior rectus fascia. This was divided transversely. Of note there was significant scar tissue from her previous abdominal operations in the rectus abdominis muscle medially was not clearly defined. I did define the muscle laterally and was able to enter the retroperitoneal space laterally. Dissection was carried down into the retroperitoneum past the psoas to the left common iliac artery which was mobilized some. There was scar tissue in this area also. The dissection was carried up to the sacral promontory and the sacral promontory dissected free bluntly. The Thompson retractor system was then placed. A reverse lipped retractor was placed on the right side of the L5-S1 disc space and secured. Next a  second retractor was placed on the left side of the L5-S1 disc space. Retractors were then placed superiorly and inferiorly. A needle was placed and the position confirmed under fluoroscopy. We were at the L5-S1 disc space. The remainder of the dictation is as per Dr. Cyndy Freeze.   [name], MD, FACS [facility]  DATE OF DICTATION:   02/24/2017

## 2017-02-24 NOTE — Evaluation (Signed)
Physical Therapy Evaluation Patient Details Name: Taylor Tyler MRN: [id number] DOB: 20-Apr-1941 Today's Date: 02/24/2017   History of Present Illness  pt is a 76 y/o female with pmh of DM, Htn, CKD admitted for 2 stage elective lumbar surgery/fusion.  Clinical Impression  Pt admitted with/for elective lumbar fusion surgery.  Presently pt needs mod/max assist for basic mobility, but expect steady improvement.  Pt currently limited functionally due to the problems listed below.  (see problems list.)  Pt will benefit from PT to maximize function and safety to be able to get home safely with available assist of family.     Follow Up Recommendations Home health PT;Supervision/Assistance - 24 hour    Equipment Recommendations  None recommended by PT    Recommendations for Other Services       Precautions / Restrictions Precautions Precautions: Fall;Back Required Braces or Orthoses: Spinal Brace Spinal Brace: Lumbar corset;Applied in sitting position Restrictions Weight Bearing Restrictions: No      Mobility  Bed Mobility Overal bed mobility: Needs Assistance Bed Mobility: Rolling;Sidelying to Sit;Sit to Sidelying Rolling: Mod assist Sidelying to sit: Mod assist;Max assist     Sit to sidelying: Mod assist General bed mobility comments: instructed in log roll and side to /from sit, needed significant truncal support.  Transfers Overall transfer level: Needs assistance Equipment used: Rolling walker (2 wheeled) Transfers: Sit to/from Stand Sit to Stand: Mod assist         General transfer comment: cues for hand placement  Ambulation/Gait Ambulation/Gait assistance: Mod assist Ambulation Distance (Feet): 2 Feet (forward and back) Assistive device: Rolling walker (2 wheeled) Gait Pattern/deviations: Step-to pattern     General Gait Details: unsteady R LE and bil knees.  pt aborted further gait today.  Stairs            Wheelchair Mobility    Modified Rankin  (Stroke Patients Only)       Balance Overall balance assessment: No apparent balance deficits (not formally assessed)                                           Pertinent Vitals/Pain Pain Assessment: 0-10 Pain Score: 8  Pain Location: R hip/flank, back Pain Descriptors / Indicators: Aching;Grimacing;Guarding Pain Intervention(s): Limited activity within patient's tolerance;Monitored during session;Patient requesting pain meds-RN notified    Home Living Family/patient expects to be discharged to:: Private residence Living Arrangements: Alone Available Help at Discharge: Family;Available PRN/intermittently Type of Home: House Home Access: Stairs to enter Entrance Stairs-Rails: None Entrance Stairs-Number of Steps: 1 Home Layout: One level Home Equipment: Grab bars - tub/shower;Walker - 2 wheels;Cane - quad;Shower seat;Bedside commode      Prior Function Level of Independence: Independent with assistive device(s)         Comments: Pt uses RW when her back is bothering her.       Hand Dominance   Dominant Hand: Right    Extremity/Trunk Assessment   Upper Extremity Assessment Upper Extremity Assessment: Overall WFL for tasks assessed    Lower Extremity Assessment Lower Extremity Assessment: RLE deficits/detail RLE Deficits / Details: hip flexors3-, proximal mm/quads 4- RLE: Unable to fully assess due to pain RLE Coordination: decreased fine motor       Communication   Communication: No difficulties  Cognition Arousal/Alertness: Awake/alert Behavior During Therapy: WFL for tasks assessed/performed Overall Cognitive Status: Within Functional Limits for tasks assessed  General Comments General comments (skin integrity, edema, etc.): pt instructed in all back care/precautions, log roll, lifting restrictions, progression of activity.    Exercises     Assessment/Plan    PT Assessment  Patient needs continued PT services  PT Problem List Decreased strength;Decreased activity tolerance;Decreased mobility;Decreased knowledge of use of DME;Decreased safety awareness;Decreased knowledge of precautions;Pain       PT Treatment Interventions Gait training;Functional mobility training;Therapeutic activities;Patient/family education;DME instruction;Stair training    PT Goals (Current goals can be found in the Care Plan section)  Acute Rehab PT Goals Patient Stated Goal: I want to go home, not a rehab (facility) PT Goal Formulation: With patient Time For Goal Achievement: 03/10/17 Potential to Achieve Goals: Good    Frequency Min 5X/week   Barriers to discharge        Co-evaluation               AM-PAC PT "6 Clicks" Daily Activity  Outcome Measure Difficulty turning over in bed (including adjusting bedclothes, sheets and blankets)?: Total Difficulty moving from lying on back to sitting on the side of the bed? : Total Difficulty sitting down on and standing up from a chair with arms (e.g., wheelchair, bedside commode, etc,.)?: Total Help needed moving to and from a bed to chair (including a wheelchair)?: A Lot Help needed walking in hospital room?: A Lot Help needed climbing 3-5 steps with a railing? : A Lot 6 Click Score: 9    End of Session Equipment Utilized During Treatment: Back brace Activity Tolerance: Patient tolerated treatment well Patient left: in bed;with call bell/phone within reach;with bed alarm set;with family/visitor present Nurse Communication: Mobility status PT Visit Diagnosis: Unsteadiness on feet (R26.81);Muscle weakness (generalized) (M62.81);Pain Pain - part of body:  (r side/back)    Time: 1017-5102 PT Time Calculation (min) (ACUTE ONLY): 26 min   Charges:   PT Evaluation $PT Eval Moderate Complexity: 1 Procedure PT Treatments $Therapeutic Activity: 8-22 mins   PT G Codes:        03/26/17  Donnella Sham,  PT [phone]  (pager)  Tessie Fass Anai Lipson 2017-03-26, 5:51 PM

## 2017-02-24 NOTE — Anesthesia Postprocedure Evaluation (Signed)
Anesthesia Post Note  Patient: Taylor Tyler  Procedure(s) Performed: Procedure(s) (LRB): Stage 1: Lumbar five-Sacral one Anterior lumbar interbody fusion (N/A) Lumbar three- five Anterior lateral lumbar interbody fusion (N/A) ABDOMINAL EXPOSURE (N/A)     Patient location during evaluation: PACU Anesthesia Type: General Level of consciousness: awake Pain management: pain level controlled Vital Signs Assessment: post-procedure vital signs reviewed and stable Respiratory status: spontaneous breathing Cardiovascular status: stable Anesthetic complications: no    Last Vitals:  Vitals:   02/24/17 1405 02/24/17 1420  BP: (!) 171/64 (!) 166/70  Pulse: (!) 57 (!) 57  Resp: 16 (!) 22  Temp:  36.4 C    Last Pain:  Vitals:   02/24/17 1420  TempSrc:   PainSc: Asleep                 Alanii Ramer

## 2017-02-24 NOTE — Transfer of Care (Signed)
Immediate Anesthesia Transfer of Care Note  Patient: Taylor Tyler  Procedure(s) Performed: Procedure(s) with comments: Stage 1: Lumbar five-Sacral one Anterior lumbar interbody fusion (N/A) - Stage 1: L5-S1 Anterior lumbar interbody fusion Lumbar three- five Anterior lateral lumbar interbody fusion (N/A) - L3-5 Anterior lateral lumbar interbody fusion with removal of coflex at L3-4, L4-5 ABDOMINAL EXPOSURE (N/A)  Patient Location: PACU  Anesthesia Type:General  Level of Consciousness: awake, alert , oriented and sedated  Airway & Oxygen Therapy: Patient Spontanous Breathing and Patient connected to nasal cannula oxygen  Post-op Assessment: Report given to RN, Post -op Vital signs reviewed and stable and Patient moving all extremities  Post vital signs: Reviewed and stable  Last Vitals:  Vitals:   02/24/17 0639 02/24/17 1330  BP: (!) 188/52 (!) 157/66  Pulse:  64  Resp:  12  Temp:  (!) 36.3 C    Last Pain:  Vitals:   02/24/17 1330  TempSrc:   PainSc: (P) Asleep      Patients Stated Pain Goal: 3 (01/75/10 2585)  Complications: No apparent anesthesia complications

## 2017-02-24 NOTE — Brief Op Note (Signed)
02/24/2017  1:15 PM  PATIENT:  Taylor Tyler  76 y.o. female  PRE-OPERATIVE DIAGNOSIS:  Lumbosacral spondylosis with radiculopathy  POST-OPERATIVE DIAGNOSIS:  Lumbosacral spondylosis with radiculopathy  PROCEDURE:  Procedure(s) with comments: Stage 1: Lumbar five-Sacral one Anterior lumbar interbody fusion (N/A) - Stage 1: L5-S1 Anterior lumbar interbody fusion Lumbar three- five Anterior lateral lumbar interbody fusion (N/A) - L3-5 Anterior lateral lumbar interbody fusion with removal of coflex at L3-4, L4-5 ABDOMINAL EXPOSURE (N/A)  SURGEON:  Surgeon(s) and Role: Panel 1:    * Ditty, Kevan Ny, MD - Primary  Panel 2:    * Angelia Mould, MD - Primary  PHYSICIAN ASSISTANT: Ferne Reus, PA-C  ANESTHESIA:   general  EBL:  Total I/O In: 2800 [I.V.:2800] Out: 1300 [Urine:1150; Blood:150]  BLOOD ADMINISTERED:none  DRAINS: JP drain  LOCAL MEDICATIONS USED:  MARCAINE   , BUPIVICAINE  and LIDOCAINE   SPECIMEN:  No Specimen  DISPOSITION OF SPECIMEN:  N/A  COUNTS:  YES  TOURNIQUET:  * No tourniquets in log *  DICTATION: .Dragon Dictation  PLAN OF CARE: Admit to inpatient   PATIENT DISPOSITION:  PACU - hemodynamically stable.   Delay start of Pharmacological VTE agent (>24hrs) due to surgical blood loss or risk of bleeding: yes

## 2017-02-24 NOTE — Progress Notes (Signed)
Pt admitted to the unit from pacu; pt A&O x4; MAE x4; denies any numbness or tingling; pt oriented to the unit and room; fall/safety precaution and prevention education completed and pt voices understanding. Pt skin clean, dry and intact except for surgical incisions x2 on LLQ and right lateral side which had clean, dry and intact honeycomb dsg. JP to suction/charged to LLQ incision. Pt IV intact transfusing; SCD's on; VSS; pt sleeping in bed with call light within reach. Will closely monitor P. Angelica Pou RN

## 2017-02-24 NOTE — Anesthesia Procedure Notes (Signed)
Arterial Line Insertion Start/End7/24/2018 7:18 AM, 02/24/2017 7:22 AM Performed by: Belinda Block, CRNA, anesthesiologist  Patient location: Pre-op. Preanesthetic checklist: patient identified, IV checked, site marked, risks and benefits discussed, surgical consent, monitors and equipment checked, pre-op evaluation and timeout performed Lidocaine 1% used for infiltration radial was placed Catheter size: 20 G Hand hygiene performed  and maximum sterile barriers used  Allen's test indicative of satisfactory collateral circulation Attempts: 1 Procedure performed without using ultrasound guided technique. Following insertion, dressing applied. Post procedure assessment: normal  Patient tolerated the procedure well with no immediate complications.

## 2017-02-24 NOTE — H&P (Signed)
Chief Complaint   Back pain  HPI   HPI: Taylor Tyler is a 76 y.o. female who presents today for two stage elective lumbar surgery. She has a longstanding history of chronic lower back pain with radiculopathy. She has failed conservative treatment to date including po medications, PT and ESI. She is without concerns today. She is ready for surgery.   PMH: Past Medical History:  Diagnosis Date  . Anemia   . Arthritis   . Chronic kidney disease    ?? renal insufficiency,   . Diabetes mellitus without complication ([place])    diagnosed 4-5 yrs ago  . Disease of pancreas   . Hypertension   . Hypothyroidism     PSH: Past Surgical History:  Procedure Laterality Date  . ABDOMINAL HYSTERECTOMY  1987  . BACK SURGERY  2013  . BREAST SURGERY  1992   Breast Reduction  . CHOLECYSTECTOMY  1982  . COLON SURGERY  2015  . COLON SURGERY    . EYE SURGERY Right 2017   cataracts  . [place]  . LUMBAR LAMINECTOMY WITH SPINOUS PROCESS PLATE 2 LEVEL N/A 7/59/1638   Procedure: LUMBAR LAMINECTOMY/DECOMPRESSION MICRODISCECTOMY CoFlex;  Surgeon: Faythe Ghee, MD;  Location: [facility];  Service: Neurosurgery;  Laterality: N/A;  Lumbar three-four,Lumbar Four-Five Laminectomy with Coflex    SH: Social History  Substance Use Topics  . Smoking status: Never Smoker  . Smokeless tobacco: Never Used  . Alcohol use No    MEDS: Prior to Admission medications   Medication Sig Start Date End Date Taking? Authorizing Provider  atorvastatin (LIPITOR) 40 MG tablet Take 40 mg by mouth every evening.   Yes [provider]  carvedilol (COREG) 25 MG tablet Take 25 mg by mouth 2 (two) times daily.  11/20/15  Yes [provider]  cloNIDine (CATAPRES) 0.2 MG tablet Take 1 tablet (0.2 mg total) by mouth 2 (two) times daily. Patient taking differently: Take 0.2 mg by mouth daily.  07/10/16 10/15/17 Yes Hower, Aaron Mose, MD  ergocalciferol (VITAMIN D2) 50000 UNITS capsule Take  50,000 Units by mouth once a week. On Tuesdays   Yes [provider]  ferrous sulfate 325 (65 FE) MG tablet Take 325 mg by mouth daily with breakfast.   Yes [provider]  furosemide (LASIX) 20 MG tablet Take 20 mg by mouth daily.  12/30/16  Yes [provider]  hydrochlorothiazide (HYDRODIURIL) 25 MG tablet Take 25 mg by mouth daily.  12/20/16  Yes [provider]  levothyroxine (SYNTHROID, LEVOTHROID) 100 MCG tablet Take 100 mcg by mouth daily before breakfast.  11/20/15  Yes [provider]  losartan (COZAAR) 100 MG tablet Take 100 mg by mouth daily after breakfast.  11/20/15  Yes [provider]  Magnesium Oxide 250 MG TABS Take 250 mg by mouth daily.   Yes [provider]  metFORMIN (GLUCOPHAGE) 500 MG tablet Take 500 mg by mouth 2 (two) times daily.    Yes [provider]  Multiple Vitamin (MULTIVITAMIN WITH MINERALS) TABS tablet Take 1 tablet by mouth daily. [facility]   Yes [provider]  naproxen sodium (ANAPROX) 220 MG tablet Take 220 mg by mouth daily as needed (pain).    Yes [provider]  Propylene Glycol-Glycerin (SOOTHE) 0.6-0.6 % SOLN Place 1 drop into both eyes daily as needed (for dry/gritty eyes.).    Yes [provider]    ALLERGY: Allergies  Allergen Reactions  . Clams [employer]  Allergy] Swelling and Other (See Comments)    Throat swells, and neck turns red  . Hydralazine Other (See Comments)    CHEST TIGHTNESS    ROS: Review of Systems  Constitutional: Negative.   HENT: Negative.   Eyes: Negative.   Respiratory: Negative.   Cardiovascular: Negative.   Gastrointestinal: Negative.   Genitourinary: Negative.   Musculoskeletal: Positive for back pain and myalgias. Negative for neck pain.  Skin: Negative.   Neurological: Positive for tingling (RLE) and sensory change (RLE). Negative for dizziness, tremors, speech change, focal weakness, seizures, loss of  consciousness and headaches.    There were no vitals filed for this visit. General appearance: WDWN, NAD Eyes: PERRL, Fundoscopic: normal Cardiovascular: Regular rate and rhythm without murmurs, rubs, gallops. No edema or variciosities. Distal pulses normal. Pulmonary: Clear to auscultation Musculoskeletal:     Muscle tone upper extremities: Normal    Muscle tone lower extremities: Normal    Motor exam: Upper Extremities Deltoid Bicep Tricep Grip  Right 5/5 5/5 5/5 5/5  Left 5/5 5/5 5/5 5/5   Lower Extremity IP Quad PF DF EHL  Right 5/5 5/5 5/5 5/5 5/5  Left 5/5 5/5 5/5 5/5 5/5   Neurological Awake, alert, oriented Memory and concentration grossly intact Speech fluent, appropriate CNII: Visual fields normal CNIII/IV/VI: EOMI CNV: Facial sensation normal CNVII: Symmetric, normal strength CNVIII: Grossly normal CNIX: Normal palate movement CNXI: Trap and SCM strength normal CN XII: Tongue protrusion normal Sensation grossly intact to LT DTR: Normal Coordination (finger/nose & heel/shin): Normal  IMAGING: No new imaging  IMPRESSION/PLAN - 76 y.o. female with chronic lumbar radiculopathy due to profound foraminal stenosis.  She has not improved with conservative treatment and injections.  In order to treat this surgically I will be performing a two staged procedure. 1st procedure: L5-S1 ALIF/L3-5 XLIF 2nd procedure: L3-4 L5-S1 Laminectomy with facetectomies for decompression/L4-5 Laminectomy with Gill procedure, L3-S1 dorsal internal fixation with mazor robot. We have discussed risks, benefits and alternatives to surgery at length. In her own language, she states understanding and wishes to proceed. We will proceed with stage 1 today.

## 2017-02-24 NOTE — Consult Note (Signed)
Medical Consultation   Taylor Tyler  TMH:[id number]  DOB: 1940-11-20  DOA: 02/24/2017  PCP: Lenard Simmer, MD   Requesting physician: Dr. Cyndy Freeze  Reason for consultation:  Poorly controlled blood pressure   History of Present Illness: Taylor Tyler is an 76 y.o. female with a hx of HTN, DM, CKD, arthritis, hypothyroid who was admitted to the Neurosurgery service for two stage elective lumbar surgery. Patient reportedly failed conservative outpatient regimen and thus was scheduled for surgery on 7/24. On the day of surgery, patient was noted to have labile blood pressures with sbp peaking at 207/59. Pt reported taking her hypertensive meds at around 5-5:30am on the day of admission. Hospitalist service consulted for assistance with blood pressure/medical management. At time of consultation, patient noted to have bp of 148/53. Denies chest pain, sob.  Review of Systems:  Review of Systems  Constitutional: Negative for chills, fever and weight loss.  HENT: Negative for ear discharge, ear pain and nosebleeds.   Eyes: Negative for double vision, photophobia and pain.  Respiratory: Negative for hemoptysis, sputum production and shortness of breath.   Cardiovascular: Negative for chest pain, palpitations and claudication.  Gastrointestinal: Negative for abdominal pain, nausea and vomiting.  Genitourinary: Negative for dysuria, frequency and urgency.  Musculoskeletal: Negative for back pain, joint pain and neck pain.  Neurological: Negative for tingling, tremors, focal weakness, seizures and loss of consciousness.  Psychiatric/Behavioral: Negative for memory loss, substance abuse and suicidal ideas. The patient does not have insomnia.    As per HPI otherwise 10 point review of systems negative.   Past Medical History: Past Medical History:  Diagnosis Date  . Anemia   . Arthritis   . Chronic kidney disease    ?? renal insufficiency,   . Diabetes mellitus without  complication ([place])    diagnosed 4-5 yrs ago  . Disease of pancreas   . DJD (degenerative joint disease)   . Hypertension   . Hypothyroidism     Past Surgical History: Past Surgical History:  Procedure Laterality Date  . ABDOMINAL HYSTERECTOMY  1987  . BACK SURGERY  2013  . BREAST SURGERY  1992   Breast Reduction  . CHOLECYSTECTOMY  1982  . COLON SURGERY  2015  . COLON SURGERY    . EYE SURGERY Right 2017   cataracts  . [place]  . LUMBAR FUSION  02/24/2017  . LUMBAR LAMINECTOMY WITH SPINOUS PROCESS PLATE 2 LEVEL N/A 04/24/1940   Procedure: LUMBAR LAMINECTOMY/DECOMPRESSION MICRODISCECTOMY CoFlex;  Surgeon: Faythe Ghee, MD;  Location: [facility];  Service: Neurosurgery;  Laterality: N/A;  Lumbar three-four,Lumbar Four-Five Laminectomy with Coflex    Allergies:   Allergies  Allergen Reactions  . Clams [Shellfish Allergy] Swelling and Other (See Comments)    Throat swells, and neck turns red  . Hydralazine Other (See Comments)    CHEST TIGHTNESS    Social History:  reports that she has never smoked. She has never used smokeless tobacco. She reports that she does not drink alcohol or use drugs.  Family History: Family History  Problem Relation Age of Onset  . Diabetes Father   . Heart attack Father   . Hypertension Father   . Diabetes Mother   . Hypertension Mother     Physical Exam: Vitals:   02/24/17 1358 02/24/17 1405 02/24/17 1420 02/24/17 1442  BP: (!) 166/92 (!) 171/64 (!) 166/70 (!) 148/53  Pulse: 61 (!) 57 (!) 57 (!) 55  Resp: 11 16 (!) 22 19  Temp:   97.6 F (36.4 C)   TempSrc:      SpO2: 94% 95% 96% 95%  Weight:        Constitutional: Appearance,  Alert and awake, oriented x3, not in any acute distress. Eyes: PERLA, EOMI, irises appear normal, anicteric sclera,  ENMT: external ears and nose appear normal, normal hearing or hard of hearing            Lips appears normal, oropharynx mucosa, tongue, posterior pharynx appear  normal  Neck: neck appears normal, no masses, normal ROM, no thyromegaly, no JVD  CVS: S1-S2 clear, no murmur rubs or gallops, no LE edema, normal pedal pulses  Respiratory:  clear to auscultation bilaterally, no wheezing, rales or rhonchi. Respiratory effort normal. No accessory muscle use.  Abdomen: soft nontender, nondistended, normal bowel sounds, no hepatosplenomegaly, no hernias  Musculoskeletal: : no cyanosis, clubbing or edema noted bilaterally                       Joint/bones/muscle exam, strength, contractures or atrophy Neuro: Cranial nerves II-XII intact, strength, sensation, reflexes Psych: judgement and insight appear normal, stable mood and affect, mental status Skin: no rashes or lesions or ulcers, no induration or nodules   Data reviewed:  I have personally reviewed following labs and imaging studies Labs:  CBC: No results for input(s): WBC, NEUTROABS, HGB, HCT, MCV, PLT in the last 168 hours.  Basic Metabolic Panel: No results for input(s): NA, K, CL, CO2, GLUCOSE, BUN, CREATININE, CALCIUM, MG, PHOS in the last 168 hours. GFR Estimated Creatinine Clearance: 32.6 mL/min (A) (by C-G formula based on SCr of 1.65 mg/dL (H)). Liver Function Tests: No results for input(s): AST, ALT, ALKPHOS, BILITOT, PROT, ALBUMIN in the last 168 hours. No results for input(s): LIPASE, AMYLASE in the last 168 hours. No results for input(s): AMMONIA in the last 168 hours. Coagulation profile No results for input(s): INR, PROTIME in the last 168 hours.  Cardiac Enzymes: No results for input(s): CKTOTAL, CKMB, CKMBINDEX, TROPONINI in the last 168 hours. BNP: Invalid input(s): POCBNP CBG:  Recent Labs Lab 02/24/17 0635 02/24/17 1331  GLUCAP 101* 141*   D-Dimer No results for input(s): DDIMER in the last 72 hours. Hgb A1c No results for input(s): HGBA1C in the last 72 hours. Lipid Profile No results for input(s): CHOL, HDL, LDLCALC, TRIG, CHOLHDL, LDLDIRECT in the last 72  hours. Thyroid function studies No results for input(s): TSH, T4TOTAL, T3FREE, THYROIDAB in the last 72 hours.  Invalid input(s): FREET3 Anemia work up No results for input(s): VITAMINB12, FOLATE, FERRITIN, TIBC, IRON, RETICCTPCT in the last 72 hours. Urinalysis    Component Value Date/Time   COLORURINE YELLOW (A) 07/06/2016 1414   APPEARANCEUR HAZY (A) 07/06/2016 1414   APPEARANCEUR Cloudy 12/14/2013 1727   LABSPEC 1.019 07/06/2016 1414   LABSPEC 1.012 12/14/2013 1727   PHURINE 5.0 07/06/2016 1414   GLUCOSEU NEGATIVE 07/06/2016 1414   GLUCOSEU Negative 12/14/2013 1727   HGBUR NEGATIVE 07/06/2016 1414   BILIRUBINUR NEGATIVE 07/06/2016 1414   BILIRUBINUR Negative 12/14/2013 1727   KETONESUR NEGATIVE 07/06/2016 1414   PROTEINUR 100 (A) 07/06/2016 1414   NITRITE POSITIVE (A) 07/06/2016 1414   LEUKOCYTESUR 2+ (A) 07/06/2016 1414   LEUKOCYTESUR 3+ 12/14/2013 1727    Microbiology Recent Results (from the past 240 hour(s))  Surgical pcr screen     Status: None   Collection  Time: 02/16/17  9:26 AM  Result Value Ref Range Status   MRSA, PCR NEGATIVE NEGATIVE Final   Staphylococcus aureus NEGATIVE NEGATIVE Final    Comment:        The Xpert SA Assay (FDA approved for NASAL specimens in patients over 27 years of age), is one component of a comprehensive surveillance program.  Test performance has been validated by [facility] for patients greater than or equal to 50 year old. It is not intended to diagnose infection nor to guide or monitor treatment.     Inpatient Medications:   Scheduled Meds: . acetaminophen  1,000 mg Oral Q6H  . atorvastatin  40 mg Oral QPM  . carvedilol  25 mg Oral BID  . cloNIDine  0.2 mg Oral BID  . docusate sodium  100 mg Oral BID  . [START ON 02/25/2017] furosemide  20 mg Oral Daily  . gabapentin  300 mg Oral TID  . [START ON 02/25/2017] hydrochlorothiazide  25 mg Oral Daily  . insulin aspart  0-15 Units Subcutaneous TID WC  . insulin aspart   0-5 Units Subcutaneous QHS  . [START ON 02/25/2017] levothyroxine  100 mcg Oral QAC breakfast  . [START ON 02/25/2017] losartan  100 mg Oral QPC breakfast  . methocarbamol      . oxyCODONE      . oxyCODONE  10 mg Oral Q12H  . senna  1 tablet Oral BID  . sodium chloride flush  3 mL Intravenous Q12H   Continuous Infusions: . sodium chloride    . sodium chloride    . sodium chloride    .  ceFAZolin (ANCEF) IV    . methocarbamol (ROBAXIN)  IV       Radiological Exams on Admission: Dg Lumbar Spine 2-3 Views  Result Date: 02/24/2017 CLINICAL DATA:  L5-S1 anterior fusion EXAM: DG C-ARM GT 120 MIN; LUMBAR SPINE - 2-3 VIEW COMPARISON:  09/30/2016 FLUOROSCOPY TIME:  Fluoroscopy Time:  1 minutes 49 seconds Radiation Exposure Index (if provided by the fluoroscopic device): Unavailable Number of Acquired Spot Images: 2 FINDINGS: Interbody fusion is again noted at L3-4 and L4-5 with posterior interspinous fixation devices. New anterior fixation plate with interbody fusion is noted at L5-S1. No acute abnormality is seen. IMPRESSION: Status post L5-S1 fusion. Electronically Signed   By: Inez Catalina M.D.   On: 02/24/2017 13:25   Dg C-arm Gt 120 Min  Result Date: 02/24/2017 CLINICAL DATA:  L5-S1 anterior fusion EXAM: DG C-ARM GT 120 MIN; LUMBAR SPINE - 2-3 VIEW COMPARISON:  09/30/2016 FLUOROSCOPY TIME:  Fluoroscopy Time:  1 minutes 49 seconds Radiation Exposure Index (if provided by the fluoroscopic device): Unavailable Number of Acquired Spot Images: 2 FINDINGS: Interbody fusion is again noted at L3-4 and L4-5 with posterior interspinous fixation devices. New anterior fixation plate with interbody fusion is noted at L5-S1. No acute abnormality is seen. IMPRESSION: Status post L5-S1 fusion. Electronically Signed   By: Inez Catalina M.D.   On: 02/24/2017 13:25   Dg Or Local Abdomen  Result Date: 02/24/2017 CLINICAL DATA:  76 year old female with incorrect instrument count following L5-S1 anterior lumbar  interbody fusion EXAM: OR LOCAL ABDOMEN COMPARISON:  None. FINDINGS: Single intraoperative radiograph the abdomen demonstrates interval surgical changes of L5-S1 anterior lumbar interbody fusion with a plate and vertebral body screw construct coal flex device is are noted at L4-L5 and L5-S1. A soft tissues fracture projects over the left lower quadrant. Two staples also project over the anatomic pelvis, likely within  overlying talus. No evidence of radiopaque needle or sponge. IMPRESSION: Intraoperative findings as above. No unexpected radiopaque foreign body identified. Electronically Signed   By: Jacqulynn Cadet M.D.   On: 02/24/2017 10:26    Impression/Recommendations Active Problems:   Accelerated hypertension   Diabetes mellitus without complication (HCC)   CKD (chronic kidney disease) stage 3, GFR 30-59 ml/min   Status post lumbar surgery  HTN - BP poorly controlled - Will resume home BP meds - Will add PRN IV labetalol for sbp>160. Hydralazine is listed as having an adverse reaction  DM -Recent a1c of 5.7 indicates good control -Will hold metformin while inpatient -Continue patient on SSI coverage  CKD -most recent Cr of 1.65 on 7/16 -Will repeat bmet -Resume home bp meds per above -Follow renal function closely  Hypothyroid -Will check TSH -Resume home thyroid replacement  S/p lumbar surgery -s/p surgery -continue per primary service  Thank you for this consultation.  Our [facility] hospitalist team will follow the patient with you.   Marzelle Rutten, Orpah Melter M.D. [facility] 02/24/2017, 4:07 PM

## 2017-02-24 NOTE — Progress Notes (Signed)
Pt presents for ALIF at L5-S1. No changes from my previous note dated 12/24/16.  [name], MD, [place] [phone] Office: [phone]

## 2017-02-25 ENCOUNTER — Encounter (HOSPITAL_COMMUNITY): Payer: Self-pay | Admitting: Neurological Surgery

## 2017-02-25 LAB — BASIC METABOLIC PANEL
Anion gap: 8 (ref 5–15)
BUN: 37 mg/dL — ABNORMAL HIGH (ref 6–20)
CO2: 22 mmol/L (ref 22–32)
Calcium: 8.9 mg/dL (ref 8.9–10.3)
Chloride: 106 mmol/L (ref 101–111)
Creatinine, Ser: 1.81 mg/dL — ABNORMAL HIGH (ref 0.44–1.00)
GFR calc Af Amer: 31 mL/min — ABNORMAL LOW (ref >60)
GFR calc non Af Amer: 27 mL/min — ABNORMAL LOW (ref >60)
Glucose, Bld: 137 mg/dL — ABNORMAL HIGH (ref 65–99)
Potassium: 4.2 mmol/L (ref 3.5–5.1)
Sodium: 136 mmol/L (ref 135–145)

## 2017-02-25 LAB — APTT: aPTT: 30 s (ref 24–36)

## 2017-02-25 LAB — GLUCOSE, CAPILLARY
GLUCOSE-CAPILLARY: 127 mg/dL — AB (ref 65–99)
GLUCOSE-CAPILLARY: 107 mg/dL — AB (ref 65–99)
GLUCOSE-CAPILLARY: 126 mg/dL — AB (ref 65–99)
Glucose-Capillary: 141 mg/dL — ABNORMAL HIGH (ref 65–99)

## 2017-02-25 LAB — CBC
HCT: 27.9 % — ABNORMAL LOW (ref 36.0–46.0)
Hemoglobin: 8.8 g/dL — ABNORMAL LOW (ref 12.0–15.0)
MCH: 27.3 pg (ref 26.0–34.0)
MCHC: 31.5 g/dL (ref 30.0–36.0)
MCV: 86.6 fL (ref 78.0–100.0)
Platelets: 224 10*3/uL (ref 150–400)
RBC: 3.22 MIL/uL — ABNORMAL LOW (ref 3.87–5.11)
RDW: 13.3 % (ref 11.5–15.5)
WBC: 12.5 10*3/uL — ABNORMAL HIGH (ref 4.0–10.5)

## 2017-02-25 LAB — PROTIME-INR
INR: 1.27
Prothrombin Time: 15.9 seconds — ABNORMAL HIGH (ref 11.4–15.2)

## 2017-02-25 NOTE — Progress Notes (Signed)
Received clarification order for activity. Md medical assistant spoke with this nurse stated that pt is to be on bedrest today. After pt has surgery, she will return to activity as tolerated including PT/OT.

## 2017-02-25 NOTE — Progress Notes (Signed)
OT Cancellation Note  Patient Details Name: Taylor Tyler MRN: [id number] DOB: February 07, 1941   Cancelled Treatment:    Reason Eval/Treat Not Completed: Patient not medically ready. According to RN note, Pt is to be on bedrest today with evaluation to begin after second part of surgery tomorrow. OT will hold eval until that time.   Merri Ray Reed Dady 02/25/2017, 1:20 PM  Hulda Humphrey OTR/L [phone]

## 2017-02-25 NOTE — Anesthesia Preprocedure Evaluation (Addendum)
Anesthesia Evaluation  Patient identified by MRN, date of birth, ID band Patient awake    Reviewed: Allergy & Precautions, H&P , NPO status , Patient's Chart, lab work & pertinent test results  Airway Mallampati: III  TM Distance: >3 FB Neck ROM: Full    Dental no notable dental hx. (+) Teeth Intact, Dental Advisory Given   Pulmonary neg pulmonary ROS,    Pulmonary exam normal breath sounds clear to auscultation       Cardiovascular Exercise Tolerance: Good hypertension, Pt. on medications and Pt. on home beta blockers  Rhythm:Regular Rate:Normal     Neuro/Psych negative neurological ROS  negative psych ROS   GI/Hepatic negative GI ROS, Neg liver ROS,   Endo/Other  diabetes, Type 2, Oral Hypoglycemic AgentsHypothyroidism Morbid obesity  Renal/GU Renal InsufficiencyRenal diseasenegative Renal ROS  negative genitourinary   Musculoskeletal  (+) Arthritis , Osteoarthritis,    Abdominal   Peds  Hematology negative hematology ROS (+) anemia ,   Anesthesia Other Findings   Reproductive/Obstetrics negative OB ROS                            Anesthesia Physical Anesthesia Plan  ASA: III  Anesthesia Plan: General   Post-op Pain Management:    Induction: Intravenous  PONV Risk Score and Plan: 4 or greater and Ondansetron, Dexamethasone and Midazolam  Airway Management Planned: Oral ETT  Additional Equipment: Arterial line  Intra-op Plan:   Post-operative Plan: Extubation in OR  Informed Consent: I have reviewed the patients History and Physical, chart, labs and discussed the procedure including the risks, benefits and alternatives for the proposed anesthesia with the patient or authorized representative who has indicated his/her understanding and acceptance.   Dental advisory given  Plan Discussed with: CRNA  Anesthesia Plan Comments:        Anesthesia Quick Evaluation

## 2017-02-25 NOTE — Progress Notes (Signed)
No acute events Some right hip flexor weakness probably due to psoas trauma Good quad strength bilaterally Normal sensation Phase 2 tomorrow

## 2017-02-25 NOTE — Progress Notes (Signed)
   VASCULAR SURGERY ASSESSMENT & PLAN:   1 Day Post-Op s/p: Anterior RP exposure of L5-S1  Doing well. Vascular will be available as needed.    SUBJECTIVE:   Worried about "hernia" on right side of abdomen. No nausea.   PHYSICAL EXAM:   Vitals:   02/25/17 0116 02/25/17 0356 02/25/17 0611 02/25/17 0642  BP: (!) 160/52 (!) 172/49 (!) 204/52 (!) 181/54  Pulse: (!) 44 (!) 46 (!) 48 (!) 50  Resp: 18  16   Temp:   98.6 F (37 C)   TempSrc:   Oral   SpO2: 99%  98%   Weight:       Palpable Left DP pulse.   LABS:   Lab Results  Component Value Date   WBC 5.1 02/16/2017   HGB 10.2 (L) 02/16/2017   HCT 31.7 (L) 02/16/2017   MCV 89.0 02/16/2017   PLT 227 02/16/2017   Lab Results  Component Value Date   CREATININE 1.42 (H) 02/24/2017   Lab Results  Component Value Date   INR 1.15 10/20/2016   CBG (last 3)   Recent Labs  02/24/17 1802 02/24/17 2154 02/25/17 0626  GLUCAP 130* 147* 127*    PROBLEM LIST:    Active Problems:   Accelerated hypertension   Diabetes mellitus without complication (HCC)   CKD (chronic kidney disease) stage 3, GFR 30-59 ml/min   Status post lumbar surgery   CURRENT MEDS:   . acetaminophen  1,000 mg Oral Q6H  . atorvastatin  40 mg Oral QPM  . carvedilol  25 mg Oral BID  . cloNIDine  0.2 mg Oral BID  . docusate sodium  100 mg Oral BID  . furosemide  20 mg Oral Daily  . gabapentin  300 mg Oral TID  . hydrochlorothiazide  25 mg Oral Daily  . insulin aspart  0-15 Units Subcutaneous TID WC  . insulin aspart  0-5 Units Subcutaneous QHS  . levothyroxine  100 mcg Oral QAC breakfast  . losartan  100 mg Oral QPC breakfast  . oxyCODONE  10 mg Oral Q12H  . senna  1 tablet Oral BID  . sodium chloride flush  3 mL Intravenous Q12H    Gae Gallop Beeper: [phone] Office: [phone] 02/25/2017

## 2017-02-25 NOTE — Progress Notes (Signed)
PT Cancellation Note  Patient Details Name: Taylor Tyler MRN: [id number] DOB: 08-20-1940   Cancelled Treatment:    Reason Eval/Treat Not Completed: Medical issues which prohibited therapy, pt currently on bedrest until surgery tomorrow. Will follow after second part of surgery as ordered.    [place] 02/25/2017, 1:33 PM

## 2017-02-26 ENCOUNTER — Encounter (HOSPITAL_COMMUNITY): Payer: Self-pay | Admitting: *Deleted

## 2017-02-26 ENCOUNTER — Inpatient Hospital Stay (HOSPITAL_COMMUNITY): Payer: Medicare Other

## 2017-02-26 ENCOUNTER — Inpatient Hospital Stay (HOSPITAL_COMMUNITY): Payer: Medicare Other | Admitting: Certified Registered Nurse Anesthetist

## 2017-02-26 ENCOUNTER — Inpatient Hospital Stay (HOSPITAL_COMMUNITY)
Admission: RE | Disposition: A | Discharge status: Skilled Nursing Facility | Payer: Self-pay | Source: Ambulatory Visit | Attending: Neurological Surgery

## 2017-02-26 ENCOUNTER — Inpatient Hospital Stay (HOSPITAL_COMMUNITY): Admission: RE | Admit: 2017-02-26 | Payer: Medicare Other | Source: Ambulatory Visit | Admitting: Neurological Surgery

## 2017-02-26 HISTORY — PX: APPLICATION OF ROBOTIC ASSISTANCE FOR SPINAL PROCEDURE: SHX6753

## 2017-02-26 LAB — GLUCOSE, CAPILLARY
Glucose-Capillary: 100 mg/dL — ABNORMAL HIGH (ref 65–99)
Glucose-Capillary: 137 mg/dL — ABNORMAL HIGH (ref 65–99)
Glucose-Capillary: 151 mg/dL — ABNORMAL HIGH (ref 65–99)
Glucose-Capillary: 153 mg/dL — ABNORMAL HIGH (ref 65–99)

## 2017-02-26 LAB — PREPARE RBC (CROSSMATCH)

## 2017-02-26 SURGERY — POSTERIOR LUMBAR FUSION 3 LEVEL
Anesthesia: General

## 2017-02-26 MED ORDER — LACTATED RINGERS IV SOLN
INTRAVENOUS | Status: DC
Start: 2017-02-26 — End: 2017-03-03
  Administered 2017-02-26: 07:00:00 via INTRAVENOUS

## 2017-02-26 MED ORDER — ACETAMINOPHEN 10 MG/ML IV SOLN
INTRAVENOUS | Status: AC
  Filled 2017-02-26: qty 100

## 2017-02-26 MED ORDER — LACTATED RINGERS IV SOLN
INTRAVENOUS | Status: DC | PRN
  Administered 2017-02-26: 09:00:00 via INTRAVENOUS

## 2017-02-26 MED ORDER — BUPIVACAINE HCL (PF) 0.5 % IJ SOLN
INTRAMUSCULAR | Status: AC
  Filled 2017-02-26: qty 30

## 2017-02-26 MED ORDER — BUPIVACAINE LIPOSOME 1.3 % IJ SUSP
20.0000 mL | Freq: Once | INTRAMUSCULAR | Status: DC
  Filled 2017-02-26 (×2): qty 20

## 2017-02-26 MED ORDER — SUCCINYLCHOLINE CHLORIDE 200 MG/10ML IV SOSY
PREFILLED_SYRINGE | INTRAVENOUS | Status: AC
  Filled 2017-02-26: qty 10

## 2017-02-26 MED ORDER — BUPIVACAINE-EPINEPHRINE (PF) 0.5% -1:200000 IJ SOLN
INTRAMUSCULAR | Status: DC | PRN
  Administered 2017-02-26: 10 mL via PERINEURAL

## 2017-02-26 MED ORDER — SODIUM CHLORIDE 0.9 % IV SOLN: Freq: Once | INTRAVENOUS | Status: DC

## 2017-02-26 MED ORDER — FENTANYL CITRATE (PF) 250 MCG/5ML IJ SOLN
INTRAMUSCULAR | Status: AC
  Filled 2017-02-26: qty 5

## 2017-02-26 MED ORDER — LIDOCAINE-EPINEPHRINE 2 %-1:100000 IJ SOLN
INTRAMUSCULAR | Status: DC | PRN
  Administered 2017-02-26: 10 mL via INTRADERMAL

## 2017-02-26 MED ORDER — VANCOMYCIN HCL 1000 MG IV SOLR
INTRAVENOUS | Status: DC | PRN
  Administered 2017-02-26: 1000 mg via TOPICAL

## 2017-02-26 MED ORDER — ONDANSETRON HCL 4 MG/2ML IJ SOLN
INTRAMUSCULAR | Status: AC
  Filled 2017-02-26: qty 2

## 2017-02-26 MED ORDER — ACETAMINOPHEN 10 MG/ML IV SOLN
INTRAVENOUS | Status: DC | PRN
  Administered 2017-02-26: 1000 mg via INTRAVENOUS

## 2017-02-26 MED ORDER — THROMBIN 20000 UNITS EX SOLR
CUTANEOUS | Status: DC | PRN
  Administered 2017-02-26: 10:00:00 via TOPICAL

## 2017-02-26 MED ORDER — 0.9 % SODIUM CHLORIDE (POUR BTL) OPTIME
TOPICAL | Status: DC | PRN
  Administered 2017-02-26: 1000 mL

## 2017-02-26 MED ORDER — BUPIVACAINE LIPOSOME 1.3 % IJ SUSP
INTRAMUSCULAR | Status: DC | PRN
  Administered 2017-02-26: 20 mL

## 2017-02-26 MED ORDER — VANCOMYCIN HCL 1000 MG IV SOLR
INTRAVENOUS | Status: AC
  Filled 2017-02-26: qty 1000

## 2017-02-26 MED ORDER — FENTANYL CITRATE (PF) 100 MCG/2ML IJ SOLN
INTRAMUSCULAR | Status: DC | PRN
  Administered 2017-02-26: 100 ug via INTRAVENOUS
  Administered 2017-02-26: 50 ug via INTRAVENOUS
  Administered 2017-02-26: 100 ug via INTRAVENOUS

## 2017-02-26 MED ORDER — MIDAZOLAM HCL 2 MG/2ML IJ SOLN
INTRAMUSCULAR | Status: AC
  Filled 2017-02-26: qty 2

## 2017-02-26 MED ORDER — SODIUM CHLORIDE BACTERIOSTATIC 0.9 % IJ SOLN
INTRAMUSCULAR | Status: DC | PRN
  Administered 2017-02-26 (×2): 10 mL

## 2017-02-26 MED ORDER — THROMBIN 5000 UNITS EX SOLR
CUTANEOUS | Status: AC
  Filled 2017-02-26: qty 5000

## 2017-02-26 MED ORDER — LIDOCAINE-EPINEPHRINE 2 %-1:100000 IJ SOLN
INTRAMUSCULAR | Status: AC
  Filled 2017-02-26: qty 1

## 2017-02-26 MED ORDER — LACTATED RINGERS IV SOLN
INTRAVENOUS | Status: DC | PRN
Start: 2017-02-26 — End: 2017-02-26
  Administered 2017-02-26 (×2): via INTRAVENOUS

## 2017-02-26 MED ORDER — ROCURONIUM BROMIDE 100 MG/10ML IV SOLN
INTRAVENOUS | Status: DC | PRN
  Administered 2017-02-26: 50 mg via INTRAVENOUS
  Administered 2017-02-26: 10 mg via INTRAVENOUS
  Administered 2017-02-26: 20 mg via INTRAVENOUS

## 2017-02-26 MED ORDER — THROMBIN 5000 UNITS EX SOLR
OROMUCOSAL | Status: DC | PRN
  Administered 2017-02-26 (×3): via TOPICAL

## 2017-02-26 MED ORDER — PHENYLEPHRINE HCL 10 MG/ML IJ SOLN
INTRAVENOUS | Status: DC | PRN
  Administered 2017-02-26: 40 ug/min via INTRAVENOUS

## 2017-02-26 MED ORDER — DEXAMETHASONE SODIUM PHOSPHATE 10 MG/ML IJ SOLN
INTRAMUSCULAR | Status: DC | PRN
  Administered 2017-02-26: 5 mg via INTRAVENOUS

## 2017-02-26 MED ORDER — THROMBIN 20000 UNITS EX SOLR
CUTANEOUS | Status: AC
  Filled 2017-02-26: qty 20000

## 2017-02-26 MED ORDER — CEFAZOLIN SODIUM-DEXTROSE 2-3 GM-% IV SOLR
INTRAVENOUS | Status: DC | PRN
  Administered 2017-02-26 (×2): 2 g via INTRAVENOUS

## 2017-02-26 MED ORDER — PROPOFOL 10 MG/ML IV BOLUS
INTRAVENOUS | Status: AC
  Filled 2017-02-26: qty 20

## 2017-02-26 MED ORDER — KETOROLAC TROMETHAMINE 30 MG/ML IJ SOLN
INTRAMUSCULAR | Status: AC
  Filled 2017-02-26: qty 1

## 2017-02-26 MED ORDER — CEFAZOLIN SODIUM-DEXTROSE 2-4 GM/100ML-% IV SOLN
INTRAVENOUS | Status: AC
  Filled 2017-02-26: qty 100

## 2017-02-26 MED ORDER — SODIUM CHLORIDE 0.9 % IJ SOLN
INTRAMUSCULAR | Status: AC
  Filled 2017-02-26: qty 20

## 2017-02-26 MED ORDER — EPHEDRINE 5 MG/ML INJ
INTRAVENOUS | Status: AC
  Filled 2017-02-26: qty 10

## 2017-02-26 MED ORDER — SODIUM CHLORIDE 0.9 % IR SOLN
Status: DC | PRN
  Administered 2017-02-26: 500 mL
  Administered 2017-02-26: 10:00:00

## 2017-02-26 MED ORDER — BUPIVACAINE-EPINEPHRINE (PF) 0.5% -1:200000 IJ SOLN
INTRAMUSCULAR | Status: AC
  Filled 2017-02-26: qty 30

## 2017-02-26 MED ORDER — METHYLPREDNISOLONE ACETATE 80 MG/ML IJ SUSP
INTRAMUSCULAR | Status: AC
  Filled 2017-02-26: qty 1

## 2017-02-26 MED ORDER — MIDAZOLAM HCL 5 MG/5ML IJ SOLN
INTRAMUSCULAR | Status: DC | PRN
  Administered 2017-02-26: 1 mg via INTRAVENOUS

## 2017-02-26 MED ORDER — SUGAMMADEX SODIUM 200 MG/2ML IV SOLN
INTRAVENOUS | Status: DC | PRN
  Administered 2017-02-26: 200 mg via INTRAVENOUS

## 2017-02-26 MED ORDER — PROPOFOL 10 MG/ML IV BOLUS
INTRAVENOUS | Status: DC | PRN
Start: 2017-02-26 — End: 2017-02-26
  Administered 2017-02-26: 150 mg via INTRAVENOUS

## 2017-02-26 MED FILL — Heparin Sodium (Porcine) Inj 1000 Unit/ML: INTRAMUSCULAR | Qty: 30 | Status: AC

## 2017-02-26 MED FILL — Sodium Chloride IV Soln 0.9%: INTRAVENOUS | Qty: 1000 | Status: AC

## 2017-02-26 SURGICAL SUPPLY — 109 items
BAG DECANTER FOR FLEXI CONT (MISCELLANEOUS) ×6 IMPLANT
BENZOIN TINCTURE PRP APPL 2/3 (GAUZE/BANDAGES/DRESSINGS) ×3 IMPLANT
BIT DRILL LONG 3.0X30 (BIT) IMPLANT
BIT DRILL LONG 3.0X30MM (BIT)
BIT DRILL LONG 3X80 (BIT) IMPLANT
BIT DRILL LONG 3X80MM (BIT)
BIT DRILL LONG 4X80 (BIT) IMPLANT
BIT DRILL LONG 4X80MM (BIT)
BIT DRILL SHORT 3.0X30 (BIT) IMPLANT
BIT DRILL SHORT 3.0X30MM (BIT)
BIT DRILL SHORT 3X80 (BIT) IMPLANT
BIT DRILL SHORT 3X80MM (BIT)
BLADE CLIPPER SURG (BLADE) IMPLANT
BLADE SURG 11 STRL SS (BLADE) ×6 IMPLANT
BRIDGE VAR CL 35-42 ARSENAL (Connector) ×3 IMPLANT
BUR MATCHSTICK NEURO 3.0 LAGG (BURR) ×3 IMPLANT
BUR ROUND FLUTED 5 RND (BURR) ×2 IMPLANT
BUR ROUND FLUTED 5MM RND (BURR) ×1
CANISTER SUCT 3000ML PPV (MISCELLANEOUS) IMPLANT
CARTRIDGE OIL MAESTRO DRILL (MISCELLANEOUS) ×1 IMPLANT
CHLORAPREP W/TINT 26ML (MISCELLANEOUS) ×3 IMPLANT
CONT SPEC 4OZ CLIKSEAL STRL BL (MISCELLANEOUS) ×3 IMPLANT
DECANTER SPIKE VIAL GLASS SM (MISCELLANEOUS) ×3 IMPLANT
DERMABOND ADVANCED (GAUZE/BANDAGES/DRESSINGS) ×4
DERMABOND ADVANCED .7 DNX12 (GAUZE/BANDAGES/DRESSINGS) ×2 IMPLANT
DIFFUSER DRILL AIR PNEUMATIC (MISCELLANEOUS) ×3 IMPLANT
DRAPE C-ARM 42X72 X-RAY (DRAPES) ×3 IMPLANT
DRAPE MICROSCOPE LEICA (MISCELLANEOUS) IMPLANT
DRAPE POUCH INSTRU U-SHP 10X18 (DRAPES) ×3 IMPLANT
DRAPE SHEET LG 3/4 BI-LAMINATE (DRAPES) ×3 IMPLANT
DRAPE SURG 17X23 STRL (DRAPES) ×3 IMPLANT
DRSG OPSITE POSTOP 4X10 (GAUZE/BANDAGES/DRESSINGS) ×3 IMPLANT
ELECT BLADE 4.0 EZ CLEAN MEGAD (MISCELLANEOUS)
ELECT REM PT RETURN 9FT ADLT (ELECTROSURGICAL) ×3
ELECTRODE BLDE 4.0 EZ CLN MEGD (MISCELLANEOUS) IMPLANT
ELECTRODE REM PT RTRN 9FT ADLT (ELECTROSURGICAL) ×1 IMPLANT
GAUZE SPONGE 4X4 12PLY STRL (GAUZE/BANDAGES/DRESSINGS) IMPLANT
GAUZE SPONGE 4X4 16PLY XRAY LF (GAUZE/BANDAGES/DRESSINGS) IMPLANT
GLOVE BIO SURGEON STRL SZ7 (GLOVE) ×3 IMPLANT
GLOVE BIO SURGEON STRL SZ7.5 (GLOVE) ×3 IMPLANT
GLOVE BIOGEL PI IND STRL 7.0 (GLOVE) IMPLANT
GLOVE BIOGEL PI IND STRL 7.5 (GLOVE) ×4 IMPLANT
GLOVE BIOGEL PI INDICATOR 7.0 (GLOVE)
GLOVE BIOGEL PI INDICATOR 7.5 (GLOVE) ×8
GLOVE ECLIPSE 7.0 STRL STRAW (GLOVE) ×3 IMPLANT
GLOVE INDICATOR 7.5 STRL GRN (GLOVE) ×9 IMPLANT
GLOVE SS BIOGEL STRL SZ 7 (GLOVE) ×1 IMPLANT
GLOVE SS BIOGEL STRL SZ 7.5 (GLOVE) ×3 IMPLANT
GLOVE SUPERSENSE BIOGEL SZ 7 (GLOVE) ×2
GLOVE SUPERSENSE BIOGEL SZ 7.5 (GLOVE) ×6
GOWN STRL REUS W/ TWL LRG LVL3 (GOWN DISPOSABLE) ×2 IMPLANT
GOWN STRL REUS W/ TWL XL LVL3 (GOWN DISPOSABLE) IMPLANT
GOWN STRL REUS W/TWL LRG LVL3 (GOWN DISPOSABLE) ×4
GOWN STRL REUS W/TWL XL LVL3 (GOWN DISPOSABLE)
GRAFT BN 10X1XDBM MAGNIFUSE (Bone Implant) ×1 IMPLANT
GRAFT BONE MAGNIFUSE 1X10CM (Bone Implant) ×2 IMPLANT
HEMOSTAT POWDER KIT SURGIFOAM (HEMOSTASIS) ×9 IMPLANT
IV SOD CHL 0.9% 1000ML (IV SOLUTION) ×3 IMPLANT
KIT BASIN OR (CUSTOM PROCEDURE TRAY) ×3 IMPLANT
KIT INFUSE SMALL (Orthopedic Implant) ×3 IMPLANT
KIT ROOM TURNOVER OR (KITS) ×3 IMPLANT
KIT SPINE MAZOR X ROBO DISP (MISCELLANEOUS) ×3 IMPLANT
NEEDLE HYPO 18GX1.5 BLUNT FILL (NEEDLE) ×3 IMPLANT
NEEDLE HYPO 21X1.5 SAFETY (NEEDLE) ×9 IMPLANT
NEEDLE HYPO 25X1 1.5 SAFETY (NEEDLE) ×3 IMPLANT
NS IRRIG 1000ML POUR BTL (IV SOLUTION) ×3 IMPLANT
OIL CARTRIDGE MAESTRO DRILL (MISCELLANEOUS) ×3
PACK LAMINECTOMY NEURO (CUSTOM PROCEDURE TRAY) ×3 IMPLANT
PACK UNIVERSAL I (CUSTOM PROCEDURE TRAY) ×3 IMPLANT
PAD ARMBOARD 7.5X6 YLW CONV (MISCELLANEOUS) ×9 IMPLANT
PATTIES SURGICAL .5X1.5 (GAUZE/BANDAGES/DRESSINGS) IMPLANT
PIN HEAD 2.5X60MM (PIN) IMPLANT
ROD PC 5.5X80 TI ARSENAL (Rod) ×3 IMPLANT
ROD PC 5.5X90 TI ARSENAL (Rod) ×3 IMPLANT
RUBBERBAND STERILE (MISCELLANEOUS) IMPLANT
SCREW PA 6.5X40 ARSENAL (Screw) ×18 IMPLANT
SCREW PA 7.5X40 ARSENAL (Screw) ×6 IMPLANT
SCREW SCHANZ SA 4.0MM (MISCELLANEOUS) IMPLANT
SCREW SET SPINAL ARSENAL 47127 (Screw) ×24 IMPLANT
SEALER BIPOLAR AQUA 2.3 (INSTRUMENTS) ×3 IMPLANT
SPONGE NEURO XRAY DETECT 1X3 (DISPOSABLE) ×3 IMPLANT
SPONGE SURGIFOAM ABS GEL 100 (HEMOSTASIS) ×3 IMPLANT
STAPLER VISISTAT 35W (STAPLE) ×3 IMPLANT
STRIP SURGICAL 1 X 6 IN (GAUZE/BANDAGES/DRESSINGS) IMPLANT
STRIP SURGICAL 1/2 X 6 IN (GAUZE/BANDAGES/DRESSINGS) IMPLANT
STRIP SURGICAL 1/4 X 6 IN (GAUZE/BANDAGES/DRESSINGS) IMPLANT
STRIP SURGICAL 3/4 X 6 IN (GAUZE/BANDAGES/DRESSINGS) IMPLANT
SUT STRATAFIX 1PDS 45CM VIOLET (SUTURE) ×3 IMPLANT
SUT STRATAFIX MNCRL+ 3-0 PS-2 (SUTURE) ×1
SUT STRATAFIX MONOCRYL 3-0 (SUTURE) ×2
SUT STRATAFIX SPIRAL + 2-0 (SUTURE) ×3 IMPLANT
SUT VIC AB 0 CT1 18XCR BRD8 (SUTURE) IMPLANT
SUT VIC AB 0 CT1 8-18 (SUTURE)
SUT VIC AB 2-0 CT1 18 (SUTURE) IMPLANT
SUT VIC AB 3-0 SH 8-18 (SUTURE) ×3 IMPLANT
SUT VIC AB 4-0 PS2 27 (SUTURE) IMPLANT
SUTURE STRATFX MNCRL+ 3-0 PS-2 (SUTURE) ×1 IMPLANT
SYR 20CC LL (SYRINGE) ×3 IMPLANT
SYR 30ML LL (SYRINGE) ×6 IMPLANT
SYR 5ML LL (SYRINGE) ×3 IMPLANT
TIP TROCAR NITINOL ILLICO 20 (INSTRUMENTS) ×18 IMPLANT
TOWEL GREEN STERILE (TOWEL DISPOSABLE) ×3 IMPLANT
TOWEL GREEN STERILE FF (TOWEL DISPOSABLE) ×3 IMPLANT
TRAY FOLEY W/METER SILVER 16FR (SET/KITS/TRAYS/PACK) ×3 IMPLANT
TUBE CONNECTING 12'X1/4 (SUCTIONS)
TUBE CONNECTING 12X1/4 (SUCTIONS) IMPLANT
TUBE CONNECTING 20'X1/4 (TUBING) ×1
TUBE CONNECTING 20X1/4 (TUBING) ×2 IMPLANT
WATER STERILE IRR 1000ML POUR (IV SOLUTION) ×3 IMPLANT

## 2017-02-26 NOTE — Anesthesia Procedure Notes (Signed)
Central Venous Catheter Insertion Performed by: Roderic Palau, anesthesiologist Start/End7/26/2018 8:55 AM, 02/26/2017 9:05 AM Patient location: Pre-op. Preanesthetic checklist: patient identified, IV checked, site marked, risks and benefits discussed, surgical consent, monitors and equipment checked, pre-op evaluation, timeout performed and anesthesia consent Position: Trendelenburg Lidocaine 1% used for infiltration and patient sedated Hand hygiene performed , maximum sterile barriers used  and Seldinger technique used Catheter size: 8 Fr Total catheter length 16. Central line was placed.Double lumen Procedure performed using ultrasound guided technique. Ultrasound Notes:anatomy identified, needle tip was noted to be adjacent to the nerve/plexus identified, no ultrasound evidence of intravascular and/or intraneural injection and image(s) printed for medical record Attempts: 1 Following insertion, dressing applied, line sutured and Biopatch. Post procedure assessment: blood return through all ports  Patient tolerated the procedure well with no immediate complications.

## 2017-02-26 NOTE — Brief Op Note (Signed)
02/24/2017 - 02/26/2017  1:51 PM  PATIENT:  Taylor Tyler  76 y.o. female  PRE-OPERATIVE DIAGNOSIS:  Lumbosacral spondylosis with radiculopathy  POST-OPERATIVE DIAGNOSIS:  Lumbosacral spondylosis with radiculopathy  PROCEDURE:  Procedure(s) with comments: Stage 2: Lumbar five-Sacral one Laminectomy with facetectomies for decompression, Lumbar four - five Laminectomy with Gill procedure, Lumbar three-Sacral one Dorsal internal fixation with Mazor robot, removal of coflex (N/A) - Stage 2: L5-S1 Laminectomy with facetectomies for decompression, L4-5 Laminectomy with Gill procedure, L3-S1 Dorsal internal fixation with Mazor robot APPLICATION OF ROBOTIC ASSISTANCE FOR SPINAL PROCEDURE (N/A)  SURGEON:  Surgeon(s) and Role:    * Ditty, Kevan Ny, MD - Primary    * Consuella Lose, MD - Assisting  PHYSICIAN ASSISTANT:   ASSISTANTS: Consuella Lose, MD  ANESTHESIA:   general  EBL:  Total I/O In: 9675 [I.V.:2800; Blood:670] Out: 700 [Urine:350; Blood:350]  BLOOD ADMINISTERED:2 units prbc  DRAINS: none   LOCAL MEDICATIONS USED:  MARCAINE    and LIDOCAINE   SPECIMEN:  No Specimen  DISPOSITION OF SPECIMEN:  N/A  COUNTS:  YES  TOURNIQUET:  * No tourniquets in log *  DICTATION: .Dragon Dictation  PLAN OF CARE: Admit to inpatient   PATIENT DISPOSITION:  PACU - hemodynamically stable.   Delay start of Pharmacological VTE agent (>24hrs) due to surgical blood loss or risk of bleeding: yes

## 2017-02-26 NOTE — Progress Notes (Signed)
Patient not examined today. Has been in the OR all day. Will follow-up tomorrow.

## 2017-02-26 NOTE — Anesthesia Procedure Notes (Signed)
Arterial Line Insertion Start/End7/26/2018 8:00 AM, 02/26/2017 8:10 AM Performed by: Kerrie Pleasure P  Patient location: Pre-op. Preanesthetic checklist: patient identified, risks and benefits discussed, monitors and equipment checked, pre-op evaluation and timeout performed Right, radial was placed Catheter size: 20 G Hand hygiene performed  and maximum sterile barriers used   Attempts: 4 Procedure performed without using ultrasound guided technique. Following insertion, dressing applied and Biopatch. Post procedure assessment: normal  Patient tolerated the procedure well with no immediate complications. Additional procedure comments: 3 attempts to place A-line on left side, 1 attempt to place line on right side as above, pt tolerated well.

## 2017-02-26 NOTE — Progress Notes (Signed)
No acute events BP (!) 143/52 (BP Location: Right Arm)   Pulse (!) 59   Temp 98.5 F (36.9 C) (Oral)   Resp 18   Ht 5\' 1"  (1.549 m)   Wt 98.4 kg (217 lb)   SpO2 96%   BMI 41.00 kg/m  Moving legs well All questions answered Ready for surgery

## 2017-02-26 NOTE — Anesthesia Procedure Notes (Signed)
Arterial Line Insertion Start/End7/26/2018 8:40 AM, 02/26/2017 8:50 AM Performed by: Roderic Palau, anesthesiologist  Patient location: OR. Preanesthetic checklist: patient identified, IV checked, site marked, risks and benefits discussed, surgical consent, monitors and equipment checked, pre-op evaluation and timeout performed Lidocaine 1% used for infiltration and patient sedated Right, brachial was placed Catheter size: 18 G Hand hygiene performed , maximum sterile barriers used  and Seldinger technique used  Attempts: 1 Procedure performed using ultrasound guided technique. Ultrasound Notes:anatomy identified, needle tip was noted to be adjacent to the nerve/plexus identified, no ultrasound evidence of intravascular and/or intraneural injection and image(s) printed for medical record Following insertion, line sutured, dressing applied and Biopatch. Post procedure assessment: normal  Patient tolerated the procedure well with no immediate complications.

## 2017-02-26 NOTE — Progress Notes (Signed)
Pt returned from surgical procedure with no noted distress. Pt stable, neuro intact. Surgical dressing clean, dry and intact. JP drain emptied before arrival.  Pt resting at this time. Safety measures in place. Pt reoriented to room. Call bell within reach. No family at bedside.  Will continue to monitor.

## 2017-02-26 NOTE — Anesthesia Procedure Notes (Signed)
Procedure Name: Intubation Date/Time: 02/26/2017 9:16 AM Performed by: Carney Living Pre-anesthesia Checklist: Patient identified, Emergency Drugs available, Suction available, Patient being monitored and Timeout performed Patient Re-evaluated:Patient Re-evaluated prior to induction Oxygen Delivery Method: Circle system utilized Preoxygenation: Pre-oxygenation with 100% oxygen Induction Type: IV induction Ventilation: Mask ventilation without difficulty and Oral airway inserted - appropriate to patient size Laryngoscope Size: Mac and 4 Grade View: Grade II Tube type: Oral Tube size: 7.5 mm Number of attempts: 1 Airway Equipment and Method: Stylet Placement Confirmation: ETT inserted through vocal cords under direct vision,  positive ETCO2 and breath sounds checked- equal and bilateral Secured at: 22 cm Tube secured with: Tape Dental Injury: Teeth and Oropharynx as per pre-operative assessment

## 2017-02-26 NOTE — Transfer of Care (Signed)
Immediate Anesthesia Transfer of Care Note  Patient: Taylor Tyler  Procedure(s) Performed: Procedure(s) with comments: Stage 2: Lumbar five-Sacral one Laminectomy with facetectomies for decompression, Lumbar four - five Laminectomy with Gill procedure, Lumbar three-Sacral one Dorsal internal fixation with Mazor robot, removal of coflex (N/A) - Stage 2: L5-S1 Laminectomy with facetectomies for decompression, L4-5 Laminectomy with Gill procedure, L3-S1 Dorsal internal fixation with Mazor robot APPLICATION OF ROBOTIC ASSISTANCE FOR SPINAL PROCEDURE (N/A)  Patient Location: PACU  Anesthesia Type:General  Level of Consciousness: awake, alert , sedated and patient cooperative  Airway & Oxygen Therapy: Patient Spontanous Breathing and Patient connected to nasal cannula oxygen  Post-op Assessment: Report given to RN, Post -op Vital signs reviewed and stable and Patient moving all extremities X 4  Post vital signs: Reviewed and stable  Last Vitals:  Vitals:   02/26/17 0055 02/26/17 0523  BP: (!) 151/56 (!) 143/52  Pulse: (!) 55 (!) 59  Resp: 18 18  Temp: 37.1 C 36.9 C    Last Pain:  Vitals:   02/26/17 0523  TempSrc: Oral  PainSc:       Patients Stated Pain Goal: 4 (41/93/79 0240)  Complications: No apparent anesthesia complications

## 2017-02-27 ENCOUNTER — Encounter (HOSPITAL_COMMUNITY): Payer: Self-pay | Admitting: Neurological Surgery

## 2017-02-27 DIAGNOSIS — Z9889 Other specified postprocedural states: Secondary | ICD-10-CM

## 2017-02-27 DIAGNOSIS — E1129 Type 2 diabetes mellitus with other diabetic kidney complication: Secondary | ICD-10-CM | POA: Diagnosis present

## 2017-02-27 DIAGNOSIS — E66813 Obesity, class 3: Secondary | ICD-10-CM

## 2017-02-27 DIAGNOSIS — E1122 Type 2 diabetes mellitus with diabetic chronic kidney disease: Secondary | ICD-10-CM

## 2017-02-27 DIAGNOSIS — E039 Hypothyroidism, unspecified: Secondary | ICD-10-CM

## 2017-02-27 HISTORY — DX: Type 2 diabetes mellitus with other diabetic kidney complication: E11.29

## 2017-02-27 HISTORY — DX: Obesity, class 3: E66.813

## 2017-02-27 HISTORY — DX: Morbid (severe) obesity due to excess calories: E66.01

## 2017-02-27 LAB — TYPE AND SCREEN
ABO/RH(D): A POS
Antibody Screen: NEGATIVE
Unit division: 0
Unit division: 0

## 2017-02-27 LAB — BASIC METABOLIC PANEL
Anion gap: 7 (ref 5–15)
BUN: 53 mg/dL — AB (ref 6–20)
CO2: 22 mmol/L (ref 22–32)
CREATININE: 2.3 mg/dL — AB (ref 0.44–1.00)
Calcium: 8.1 mg/dL — ABNORMAL LOW (ref 8.9–10.3)
Chloride: 105 mmol/L (ref 101–111)
GFR calc Af Amer: 23 mL/min — ABNORMAL LOW (ref >60)
GFR, EST NON AFRICAN AMERICAN: 20 mL/min — AB (ref >60)
GLUCOSE: 138 mg/dL — AB (ref 65–99)
POTASSIUM: 4.6 mmol/L (ref 3.5–5.1)
Sodium: 134 mmol/L — ABNORMAL LOW (ref 135–145)

## 2017-02-27 LAB — GLUCOSE, CAPILLARY
GLUCOSE-CAPILLARY: 103 mg/dL — AB (ref 65–99)
GLUCOSE-CAPILLARY: 128 mg/dL — AB (ref 65–99)
Glucose-Capillary: 141 mg/dL — ABNORMAL HIGH (ref 65–99)
Glucose-Capillary: 131 mg/dL — ABNORMAL HIGH (ref 65–99)

## 2017-02-27 LAB — BPAM RBC
BLOOD PRODUCT EXPIRATION DATE: 201808132359
Blood Product Expiration Date: 201808142359
ISSUE DATE / TIME: 201807260906
ISSUE DATE / TIME: 201807260906
UNIT TYPE AND RH: 6200
Unit Type and Rh: 6200

## 2017-02-27 MED FILL — Gelatin Absorbable MT Powder: OROMUCOSAL | Qty: 2 | Status: AC

## 2017-02-27 NOTE — Evaluation (Signed)
Occupational Therapy Evaluation Patient Details Name: Taylor Tyler MRN: [id number] DOB: 1941/07/27 Today's Date: 02/27/2017    History of Present Illness Pt is a 76 y/o female s/p 2 part back surgery: Stage 1: L5-S1 Anterior lumbar interbody fusion; L3-5 Anterior lateral lumbar interbody fusion with removal of coflex Stage 2:  L5-S1 Laminectomy with facetectomies for decompression, L4-5 Laminectomy with Gill procedure, L3-S1 Dorsal internal fixation with Mazor robot. Pt has a past medical history of Anemia; Arthritis; Chronic kidney disease; Diabetes mellitus; Disease of pancreas; DJD; Hypertension; previous back surgeries and Hypothyroidism.   Clinical Impression   PTA Pt independent in ADL and used RW for mobility. Pt is currently max A for ADL and MAX A +2 for sit to stand (unsafe to transfer this time). Back handout provided and reviewed adls in detail. Pt educated on: clothing between brace, never sleep in brace, set an alarm at night for medication, avoid sitting for long periods of time, correct bed positioning for sleeping, correct sequence for bed mobility, avoiding lifting more than 5 pounds and never wash directly over incision. Pt with varying levels of verbalizing understanding this session. Pt will required skilled OT in the acute setting and after dc at SNF as Pt lives alone to maximize safety and independence in ADL and functional transfers.      Follow Up Recommendations  SNF;Supervision/Assistance - 24 hour    Equipment Recommendations  Other (comment) (defer to next venue)    Recommendations for Other Services       Precautions / Restrictions Precautions Precautions: Fall;Back Precaution Booklet Issued: Yes (comment) Precaution Comments: therapists reviewed 3/3 back precautions with pt throughout session Required Braces or Orthoses: Spinal Brace Spinal Brace: Lumbar corset;Applied in sitting position Restrictions Weight Bearing Restrictions: No      Mobility Bed  Mobility Overal bed mobility: Needs Assistance Bed Mobility: Rolling;Sidelying to Sit;Sit to Sidelying Rolling: Mod assist Sidelying to sit: Max assist;+2 for physical assistance     Sit to sidelying: Max assist;+2 for physical assistance General bed mobility comments: pt required increased time and effort, use of bed rails, multimodal cueing for technique/sequencing, significant assist at bilateral LEs and trunk  Transfers Overall transfer level: Needs assistance Equipment used: Rolling walker (2 wheeled) Transfers: Sit to/from Stand Sit to Stand: Max assist;+2 physical assistance         General transfer comment: increased time and effort, cueing, assist to rise from bed. pt attempted to achieve standing position x4 from EOB. Upon standing, pt with flexed posture and significant instability at bilateral knees    Balance Overall balance assessment: Needs assistance Sitting-balance support: Feet supported Sitting balance-Leahy Scale: Poor Sitting balance - Comments: progressing from max A to min A to maintain sitting balance Postural control: Posterior lean Standing balance support: Bilateral upper extremity supported Standing balance-Leahy Scale: Poor Standing balance comment: max A x2                           ADL either performed or assessed with clinical judgement   ADL Overall ADL's : Needs assistance/impaired Eating/Feeding: Set up;Bed level   Grooming: Minimal assistance;Bed level   Upper Body Bathing: Minimal assistance;Bed level   Lower Body Bathing: Total assistance   Upper Body Dressing : Maximal assistance;Sitting Upper Body Dressing Details (indicate cue type and reason): to don brace, +2 to maintain sitting  Lower Body Dressing: Total assistance   Toilet Transfer: Maximal assistance;+2 for physical assistance;+2 for safety/equipment Toilet Transfer Details (  indicate cue type and reason): unable to perform stand pivot to [facility] at this time, Pt  required max +2 for rolling to use bed pan Toileting- Clothing Manipulation and Hygiene: Total assistance;Bed level Toileting - Clothing Manipulation Details (indicate cue type and reason): rolling in the bed for peri care     Functional mobility during ADLs:  (not safe to attempt this session)       Vision Patient Visual Report: No change from baseline       Perception     Praxis      Pertinent Vitals/Pain Pain Assessment: Faces Faces Pain Scale: Hurts a little bit Pain Location: back Pain Descriptors / Indicators: Sore Pain Intervention(s): Monitored during session;Repositioned     Hand Dominance Right   Extremity/Trunk Assessment Upper Extremity Assessment Upper Extremity Assessment: Generalized weakness   Lower Extremity Assessment Lower Extremity Assessment: Defer to PT evaluation   Cervical / Trunk Assessment Cervical / Trunk Assessment: Other exceptions Cervical / Trunk Exceptions: s/p spinal surgery   Communication Communication Communication: No difficulties   Cognition Arousal/Alertness: Awake/alert Behavior During Therapy: Flat affect Overall Cognitive Status: Impaired/Different from baseline Area of Impairment: Attention;Memory;Following commands;Safety/judgement;Problem solving                   Current Attention Level: Sustained Memory: Decreased short-term memory;Decreased recall of precautions Following Commands: Follows one step commands with increased time Safety/Judgement: Decreased awareness of safety;Decreased awareness of deficits   Problem Solving: Slow processing;Decreased initiation;Difficulty sequencing;Requires verbal cues;Requires tactile cues General Comments: pt required multimodal cueing and review of back precautions throughout. When given verbal cueing to "use your arms" on RW to assist with stability, pt responded "what arms?"   General Comments       Exercises     Shoulder Instructions      Home Living  Family/patient expects to be discharged to:: Private residence Living Arrangements: Alone Available Help at Discharge: Family;Available PRN/intermittently Type of Home: House Home Access: Stairs to enter [employer] of Steps: 1 Entrance Stairs-Rails: None Home Layout: One level     Bathroom Shower/Tub: Occupational psychologist: Handicapped height Bathroom Accessibility: Yes   Home Equipment: Grab bars - tub/shower;Walker - 2 wheels;Cane - quad;Shower seat;Bedside commode          Prior Functioning/Environment Level of Independence: Independent with assistive device(s)        Comments: Pt uses RW when her back is bothering her.          OT Problem List: Decreased strength;Decreased range of motion;Decreased activity tolerance;Impaired balance (sitting and/or standing);Decreased safety awareness;Decreased knowledge of use of DME or AE;Decreased knowledge of precautions;Obesity;Pain      OT Treatment/Interventions: Self-care/ADL training;Energy conservation;DME and/or AE instruction;Therapeutic activities;Patient/family education;Balance training    OT Goals(Current goals can be found in the care plan section) Acute Rehab OT Goals Patient Stated Goal: to get stronger OT Goal Formulation: With patient Time For Goal Achievement: 03/13/17 Potential to Achieve Goals: Good ADL Goals Pt Will Perform Grooming: with set-up;sitting Pt Will Perform Upper Body Bathing: with supervision;with caregiver independent in assisting;with adaptive equipment;sitting Pt Will Perform Lower Body Bathing: with supervision;with adaptive equipment;sitting/lateral leans Pt Will Transfer to Toilet: with min assist;squat pivot transfer;bedside commode (with RW) Pt Will Perform Toileting - Clothing Manipulation and hygiene: with supervision;with adaptive equipment;sitting/lateral leans Additional ADL Goal #1: Pt will recall 3/3 back precautions and maintain during ADL task Additional  ADL Goal #2: Pt will perform bed mobility at supervision level and maintain back  precautions with no verbal cues  OT Frequency: Min 2X/week   Barriers to D/C: Decreased caregiver support  Pt lives alone       Co-evaluation PT/OT/SLP Co-Evaluation/Treatment: Yes Reason for Co-Treatment: For patient/therapist safety;To address functional/ADL transfers PT goals addressed during session: Mobility/safety with mobility;Balance;Proper use of DME;Strengthening/ROM OT goals addressed during session: ADL's and self-care      AM-PAC PT "6 Clicks" Daily Activity     Outcome Measure Help from another person eating meals?: A Little Help from another person taking care of personal grooming?: A Lot Help from another person toileting, which includes using toliet, bedpan, or urinal?: A Lot Help from another person bathing (including washing, rinsing, drying)?: A Lot Help from another person to put on and taking off regular upper body clothing?: A Lot Help from another person to put on and taking off regular lower body clothing?: Total 6 Click Score: 12   End of Session Equipment Utilized During Treatment: Gait belt;Rolling walker;Back brace Nurse Communication: Mobility status  Activity Tolerance: Patient tolerated treatment well Patient left: in bed;with call bell/phone within reach;with nursing/sitter in room;with bed alarm set  OT Visit Diagnosis: Unsteadiness on feet (R26.81);Other abnormalities of gait and mobility (R26.89);Muscle weakness (generalized) (M62.81);Pain Pain - Right/Left: Right Pain - part of body: Leg (back)                Time: 8264-1583 OT Time Calculation (min): 26 min Charges:  OT General Charges $OT Visit: 1 Procedure OT Evaluation $OT Eval Moderate Complexity: 1 Procedure G-Codes:     Hulda Humphrey OTR/L [place] 02/27/2017, 5:36 PM

## 2017-02-27 NOTE — Op Note (Signed)
02/24/2017  9:03 AM  PATIENT:  Taylor Tyler  76 y.o. female  PRE-OPERATIVE DIAGNOSIS:  Lumbosacral spondylosis with radiculopathy; lumbosacral spondylolisthesis  POST-OPERATIVE DIAGNOSIS:  Same  PROCEDURE:  L5-S1 anterior lumbar interbody fusion (exposure provided by Dr. Scot Dock) with anterior plating; L3-4, L4-5 transpsoas anterior lumbar interbody fusion; use of BMP  SURGEON:  Aldean Ast, MD  CO-SURGEON (for L5-S1 ALIF): [name], MD  ASSISTANTS (for L3-4 and L4-5 fusion): Ferne Reus, PA-C  ANESTHESIA:   General  DRAINS: JP   SPECIMEN:  None  INDICATION FOR PROCEDURE: 76 year old woman with medically intractable back and leg pain. Patient understood the risks, benefits, and alternatives and potential outcomes and wished to proceed.  PROCEDURE DETAILS: The patient was positioned supine on the operative table.  The abdomen was prepped and draped in the usual sterile fashion.  Dr. Scot Dock provided exposure for the L5-S1 level.  Please see his separately dictated operative note for the details of his portion of the procedure.  After retractors were in place I identified the midline of the L5-S1 disc.  I then marked this and incised the annulus.  I completed discectomy using endplate shavers, Kerrison rongeurs, and ring curettes.  I selected several trial spacers and used them to determine the appropriate sized cage.  A PEEK cage was then packed with morselized allograft and BMP.  This was positioned in the disc space and countersunk.  A plate was selected and positioned over the disc space.  Using an awl prior to placing the screws I inserted four screws, two each into L5 and S1.  I tightened them beyond the locking mechanism.  I irrigated with bacitracin saline.  I was pleased that there was hemostasis.  I then removed the retractors.  I closed the incision in routine anatomic layers.  I placed a JP drain in the subcutaneous fat layer. The skin was closed with a  running monocryl and dermabond.  The patient was then placed in the lateral position with the right side up and secured with tape.  Localizing views were taken with fluoroscopy.  The operative site was then prepped and draped in the usual sterile fashion.  The planned incisions were infiltrated with lidocaine with epinephrine.   The skin was sharply incised and soft tissue to the level of the oblique abdominal muscles was carried out with monopolar cautery.  I then bluntly dissected through the oblique muscles with care taken to preserve any nerves.  I encountered encountered transversalis fascia which was bluntly entered and this opened into an easily dissected fat plane consistent with the retroperitoneum.  I inserted the dilator and approached the posterior third of the L4-5 disc space.  I confirmed that I was not in contact with any nerves of the lumbar plexus. A K-wire was passed into the disc space.  The tract was sequentially dilated and then the retractor was introduced.  I incised the disc space and then passed a Cobb elevator along each endplate and penetrated the annulus on the contralateral side.  I then used box cutters, a paddle, and a rasp to complete the discectomy.  A trial spacer was used to determine appropriate graft size and then a PEEK lordotic interbody graft packed with BMP and allograft was inserted.  The retractor was removed after hemostasis was confirmed.   The retractor was then repositioned to L3-4.  Discectomy was again performed.  A second interbody graft packed with BMP and allograft was inserted.  Hemostasis was again confirmed  and the retractor was removed.     The incisions were closed in layers with interrupted vicryl sutures.  The skin was closed with running monocryl stratafix sutures and dermabond.  The patient was then turned supine on a stretcher.   PATIENT DISPOSITION:  PACU - hemodynamically stable.   Delay start of Pharmacological VTE agent (>24hrs) due to  surgical blood loss or risk of bleeding:  yes

## 2017-02-27 NOTE — Anesthesia Postprocedure Evaluation (Signed)
Anesthesia Post Note  Patient: Taylor Tyler  Procedure(s) Performed: Procedure(s) (LRB): Stage 2: Lumbar five-Sacral one Laminectomy with facetectomies for decompression, Lumbar four - five Laminectomy with Gill procedure, Lumbar three-Sacral one Dorsal internal fixation with Mazor robot, removal of coflex (N/A) APPLICATION OF ROBOTIC ASSISTANCE FOR SPINAL PROCEDURE (N/A)     Patient location during evaluation: PACU Anesthesia Type: General Level of consciousness: awake and alert Pain management: pain level controlled Vital Signs Assessment: post-procedure vital signs reviewed and stable Respiratory status: spontaneous breathing, nonlabored ventilation and respiratory function stable Cardiovascular status: blood pressure returned to baseline and stable Postop Assessment: no signs of nausea or vomiting Anesthetic complications: no    Last Vitals:  Vitals:   02/27/17 1020 02/27/17 1400  BP: 122/76 (!) 161/51  Pulse: (!) 54 63  Resp: 18 16  Temp: 36.9 C 36.8 C    Last Pain:  Vitals:   02/27/17 1600  TempSrc:   PainSc: 0-No pain                 Geraldina Parrott,W. EDMOND

## 2017-02-27 NOTE — Progress Notes (Signed)
Patient is still drowsy and not totally coherent, unable to sit up or stand, she has not had any pain dedication today. Will continue to monitor.

## 2017-02-27 NOTE — Progress Notes (Signed)
Physical Therapy Treatment Patient Details Name: Taylor Tyler MRN: [id number] DOB: [birth year] Today's Date: 02/27/2017    History of Present Illness Pt is a 76 y/o female s/p 2 part back surgery: Stage 1: L5-S1 Anterior lumbar interbody fusion; L3-5 Anterior lateral lumbar interbody fusion with removal of coflex Stage 2:  L5-S1 Laminectomy with facetectomies for decompression, L4-5 Laminectomy with Gill procedure, L3-S1 Dorsal internal fixation with Mazor robot. Pt has a past medical history of Anemia; Arthritis; Chronic kidney disease; Diabetes mellitus; Disease of pancreas; DJD; Hypertension; previous back surgeries and Hypothyroidism.    PT Comments    Pt seen again today at request of RN for assistance with transfer. Attempted to use the [facility] to transfer pt from bed to [facility]; however, pt very lethargic and not able to assist or clear buttock off of bed to achieve standing position. Pt would continue to benefit from skilled physical therapy services at this time while admitted and after d/c to address the below listed limitations in order to improve overall safety and independence with functional mobility.    Follow Up Recommendations  SNF;Supervision/Assistance - 24 hour     Equipment Recommendations  None recommended by PT    Recommendations for Other Services       Precautions / Restrictions Precautions Precautions: Fall;Back Precaution Comments: therapists reviewed 3/3 back precautions with pt throughout session Required Braces or Orthoses: Spinal Brace Spinal Brace: Lumbar corset;Applied in sitting position Restrictions Weight Bearing Restrictions: No    Mobility  Bed Mobility Overal bed mobility: Needs Assistance Bed Mobility: Rolling;Sidelying to Sit;Sit to Sidelying Rolling: Mod assist Sidelying to sit: Max assist;+2 for physical assistance     Sit to sidelying: Total assist;+2 for physical assistance General bed mobility comments: pt required increased time and  effort, use of bed rails, multimodal cueing for technique/sequencing, significant assist at bilateral LEs and trunk  Transfers Overall transfer level: Needs assistance Equipment used:  Charlaine Dalton) Transfers: Sit to/from Stand Sit to Stand: Max assist;+2 physical assistance;From elevated surface         General transfer comment: attempted sit<>stand from EOB with max A x2 (therapist and RN) multiple times; however, pt only minimally clearing buttocks from the bed  Ambulation/Gait             General Gait Details: unable to attempt   Stairs            Wheelchair Mobility    Modified Rankin (Stroke Patients Only)       Balance Overall balance assessment: Needs assistance Sitting-balance support: Feet supported Sitting balance-Leahy Scale: Poor Sitting balance - Comments: pt required max A at trunk throughout Postural control: Posterior lean;Left lateral lean Standing balance support: Bilateral upper extremity supported Standing balance-Leahy Scale: Poor Standing balance comment: max A x2                            Cognition Arousal/Alertness: Lethargic Behavior During Therapy: Flat affect Overall Cognitive Status: Impaired/Different from baseline Area of Impairment: Attention;Memory;Following commands;Safety/judgement;Problem solving                   Current Attention Level: Sustained Memory: Decreased short-term memory;Decreased recall of precautions Following Commands: Follows one step commands with increased time Safety/Judgement: Decreased awareness of safety;Decreased awareness of deficits   Problem Solving: Slow processing;Decreased initiation;Difficulty sequencing;Requires verbal cues;Requires tactile cues General Comments: pt required multimodal cueing and review of back precautions throughout. When given verbal cueing to "use your arms"  on RW to assist with stability, pt responded "what arms?"      Exercises      General  Comments        Pertinent Vitals/Pain Pain Assessment: Faces Faces Pain Scale: Hurts a little bit Pain Location: back Pain Descriptors / Indicators: Sore Pain Intervention(s): Monitored during session;Repositioned    Home Living                      Prior Function            PT Goals (current goals can now be found in the care plan section) Acute Rehab PT Goals PT Goal Formulation: With patient Time For Goal Achievement: 03/10/17 Potential to Achieve Goals: Fair Progress towards PT goals: Not progressing toward goals - comment (lethargic)    Frequency    Min 5X/week      PT Plan Current plan remains appropriate    Co-evaluation PT/OT/SLP Co-Evaluation/Treatment: Yes Reason for Co-Treatment: For patient/therapist safety;To address functional/ADL transfers PT goals addressed during session: Mobility/safety with mobility;Balance;Proper use of DME;Strengthening/ROM        AM-PAC PT "6 Clicks" Daily Activity  Outcome Measure  Difficulty turning over in bed (including adjusting bedclothes, sheets and blankets)?: Total Difficulty moving from lying on back to sitting on the side of the bed? : Total Difficulty sitting down on and standing up from a chair with arms (e.g., wheelchair, bedside commode, etc,.)?: Total Help needed moving to and from a bed to chair (including a wheelchair)?: Total Help needed walking in hospital room?: Total Help needed climbing 3-5 steps with a railing? : Total 6 Click Score: 6    End of Session Equipment Utilized During Treatment: Other (comment) Charlaine Dalton) Activity Tolerance: Patient limited by lethargy Patient left: in bed;with call bell/phone within reach;with bed alarm set Nurse Communication: Mobility status;Precautions PT Visit Diagnosis: Unsteadiness on feet (R26.81);Muscle weakness (generalized) (M62.81);Pain Pain - part of body:  (back)     Time: 0223-3612 PT Time Calculation (min) (ACUTE ONLY): 15 min  Charges:   $Therapeutic Activity: 8-22 mins                    G Codes:       [place], [place], [place] 02/27/2017, 3:54 PM

## 2017-02-27 NOTE — Progress Notes (Signed)
Physical Therapy Treatment Patient Details Name: Taylor Tyler MRN: [id number] DOB: 1941-07-11 Today's Date: 02/27/2017    History of Present Illness Pt is a 76 y/o female s/p 2 part back surgery: Stage 1: L5-S1 Anterior lumbar interbody fusion; L3-5 Anterior lateral lumbar interbody fusion with removal of coflex Stage 2:  L5-S1 Laminectomy with facetectomies for decompression, L4-5 Laminectomy with Gill procedure, L3-S1 Dorsal internal fixation with Mazor robot. Pt has a past medical history of Anemia; Arthritis; Chronic kidney disease; Diabetes mellitus; Disease of pancreas; DJD; Hypertension; previous back surgeries and Hypothyroidism.    PT Comments    Pt seen for re-evaluation following second part of her back procedure. Pt much more limited in her physical abilities this session and presenting with generalized weakness, poor balance and coordination, as well as cognitive deficits (see below). Pt required two person heavy physical assistance for bed mobility and sit<>stand transfers. PT updated d/c recommendations to SNF for further intensive therapy services prior to returning home. Pt would continue to benefit from skilled physical therapy services at this time while admitted and after d/c to address the below listed limitations in order to improve overall safety and independence with functional mobility.    Follow Up Recommendations  SNF;Supervision/Assistance - 24 hour     Equipment Recommendations  None recommended by PT    Recommendations for Other Services       Precautions / Restrictions Precautions Precautions: Fall;Back Precaution Comments: therapists reviewed 3/3 back precautions with pt throughout session Required Braces or Orthoses: Spinal Brace Spinal Brace: Lumbar corset;Applied in sitting position Restrictions Weight Bearing Restrictions: No    Mobility  Bed Mobility Overal bed mobility: Needs Assistance Bed Mobility: Rolling;Sidelying to Sit;Sit to  Sidelying Rolling: Mod assist Sidelying to sit: Max assist;+2 for physical assistance     Sit to sidelying: Max assist;+2 for physical assistance General bed mobility comments: pt required increased time and effort, use of bed rails, multimodal cueing for technique/sequencing, significant assist at bilateral LEs and trunk  Transfers Overall transfer level: Needs assistance Equipment used: Rolling walker (2 wheeled) Transfers: Sit to/from Stand Sit to Stand: Max assist;+2 physical assistance         General transfer comment: increased time and effort, cueing, assist to rise from bed. pt attempted to achieve standing position x4 from EOB. Upon standing, pt with flexed posture and significant instability at bilateral knees  Ambulation/Gait             General Gait Details: unable to attempt   Stairs            Wheelchair Mobility    Modified Rankin (Stroke Patients Only)       Balance Overall balance assessment: Needs assistance Sitting-balance support: Feet supported Sitting balance-Leahy Scale: Poor Sitting balance - Comments: progressing from max A to min A to maintain sitting balance Postural control: Posterior lean Standing balance support: Bilateral upper extremity supported Standing balance-Leahy Scale: Poor Standing balance comment: max A x2                            Cognition Arousal/Alertness: Awake/alert Behavior During Therapy: Flat affect Overall Cognitive Status: Impaired/Different from baseline Area of Impairment: Attention;Memory;Following commands;Safety/judgement;Problem solving                   Current Attention Level: Sustained Memory: Decreased short-term memory;Decreased recall of precautions Following Commands: Follows one step commands with increased time Safety/Judgement: Decreased awareness of safety;Decreased awareness of  deficits   Problem Solving: Slow processing;Decreased initiation;Difficulty  sequencing;Requires verbal cues;Requires tactile cues General Comments: pt required multimodal cueing and review of back precautions throughout. When given verbal cueing to "use your arms" on RW to assist with stability, pt responded "what arms?"      Exercises      General Comments        Pertinent Vitals/Pain Pain Assessment: Faces Faces Pain Scale: Hurts a little bit Pain Location: back Pain Descriptors / Indicators: Sore Pain Intervention(s): Monitored during session;Repositioned    Home Living                      Prior Function            PT Goals (current goals can now be found in the care plan section) Acute Rehab PT Goals PT Goal Formulation: With patient Time For Goal Achievement: 03/10/17 Potential to Achieve Goals: Fair Progress towards PT goals: Not progressing toward goals - comment    Frequency    Min 5X/week      PT Plan Discharge plan needs to be updated    Co-evaluation PT/OT/SLP Co-Evaluation/Treatment: Yes Reason for Co-Treatment: For patient/therapist safety;To address functional/ADL transfers PT goals addressed during session: Mobility/safety with mobility;Balance;Proper use of DME;Strengthening/ROM        AM-PAC PT "6 Clicks" Daily Activity  Outcome Measure  Difficulty turning over in bed (including adjusting bedclothes, sheets and blankets)?: Total Difficulty moving from lying on back to sitting on the side of the bed? : Total Difficulty sitting down on and standing up from a chair with arms (e.g., wheelchair, bedside commode, etc,.)?: Total Help needed moving to and from a bed to chair (including a wheelchair)?: Total Help needed walking in hospital room?: Total Help needed climbing 3-5 steps with a railing? : Total 6 Click Score: 6    End of Session Equipment Utilized During Treatment: Gait belt;Back brace Activity Tolerance: Patient limited by fatigue Patient left: in bed;with call bell/phone within reach;with bed  alarm set;Other (comment) (IV team present) Nurse Communication: Mobility status;Precautions PT Visit Diagnosis: Unsteadiness on feet (R26.81);Muscle weakness (generalized) (M62.81);Pain Pain - part of body:  (back)     Time: 7253-6644 PT Time Calculation (min) (ACUTE ONLY): 26 min  Charges:  $Therapeutic Activity: 8-22 mins                    G Codes:       [place], [place], [place] 02/27/2017, 2:13 PM

## 2017-02-27 NOTE — Progress Notes (Signed)
Pt seen and examined.  No issues overnight. Pain is manageable.  Tolerating po.   EXAM: Temp:  [97.2 F (36.2 C)-98.2 F (36.8 C)] 98.2 F (36.8 C) (07/27 0018) Pulse Rate:  [46-59] 59 (07/27 0018) Resp:  [9-18] 18 (07/27 0018) BP: (112-148)/(40-77) 115/67 (07/27 0018) SpO2:  [94 %-100 %] 100 % (07/27 0018) Arterial Line BP: (154-185)/(40-47) 171/44 (07/26 1605) Intake/Output      07/26 0701 - 07/27 0700 07/27 0701 - 07/28 0700   I.V. (mL/kg) 3881.2 (39.4)    Blood 670    IV Piggyback 0    Total Intake(mL/kg) 4551.2 (46.3)    Urine (mL/kg/hr) 500 (0.2)    Drains 6    Blood 350    Total Output 856     Net +3695.2           Awake and alert Seems over-medicated Follows commands throughout MAEW. Continued right Hip flexion weakness, likely secondary to trauma from surgery.   Plan Over medicated this morning. Will cut back on narcotics. Discussed with nursing. Otherwise, continue current care. Ambulate with PT/OT Incisions c/d/i

## 2017-02-27 NOTE — Progress Notes (Addendum)
Progress Note    KINSLEIGH LUDOLPH  SHF:[id number] DOB: 1940-09-24  DOA: 02/24/2017 PCP: Lenard Simmer, MD    Brief Narrative:   Reason for consultation: Blood pressure control/medical management  Medical records reviewed and are as summarized below:  Taylor Tyler is an 76 y.o. female with a PMH of hypertension, diabetes, CKD, hypothyroidism who was admitted by the neurosurgical service for a two-stage elective lumbar surgery. Internal medicine was consulted for elevated blood pressure and medical management of chronic medical conditions.  Assessment/Plan:   Active Problems:   Accelerated hypertension Blood pressure currently well controlled. Continue Catapres, Coreg, Lasix, HCTZ, Cozaar and when necessary labetalol.    Diabetes mellitus with renal complications (HCC) CBGs well-controlled, 100-153, on moderate scale SSI.    CKD (chronic kidney disease) stage 3, GFR 30-59 ml/min Check renal function.    Status post lumbar surgery Per neurosurgery.    Hyperlipidemia Continue Lipitor.    Obesity, Class III, BMI 40-49.9 (morbid obesity) (HCC) Body mass index is 41 kg/m.    Hypothyroidism TSH slightly low, but unsure how 20 per per this in the setting of recent surgery. Will check free T4. May need adjustment of her thyroid hormone replacement.   Subjective:   Reports back pain and some nausea.  Able to eat breakfast.  Had a small BM this morning.  No dyspnea.   Objective:    Vitals:   02/26/17 1630 02/26/17 1701 02/26/17 2109 02/27/17 0018  BP:  118/77 112/71 115/67  Pulse:  (!) 53 (!) 55 (!) 59  Resp:  16 18 18   Temp: (!) 97.3 F (36.3 C) 97.7 F (36.5 C) 98 F (36.7 C) 98.2 F (36.8 C)  TempSrc:  Oral Oral Oral  SpO2:  100% 100% 100%  Weight:      Height:        Intake/Output Summary (Last 24 hours) at 02/27/17 0849 Last data filed at 02/27/17 0300  Gross per 24 hour  Intake          4551.16 ml  Output              856 ml  Net          3695.16  ml   Filed Weights   02/24/17 0628 02/26/17 0725  Weight: 98.4 kg (217 lb) 98.4 kg (217 lb)    Exam: General exam: Chronically ill appearing female, mildly sedated appearing. Respiratory system: Lungs with occasional rhonchi. Cardiovascular system: Heart sounds are regular, S1, S2. No murmurs, rubs, or gallops. Gastrointestinal system: Abdomen is soft, nontender, nondistended with normal active bowel sounds. Central nervous system: Sleepy appearing, but oriented. Was all excised 4 with equal strength. Extremities: No edema.  SCDs on. Skin: No rashes, warm and dry. Psychiatry: Mood and affect flat. Fair insight and judgment.  Data Reviewed:   I have personally reviewed following labs and imaging studies:  Labs: Labs show the following:   Basic Metabolic Panel:  Recent Labs Lab 02/24/17 1543 02/25/17 0802  NA 139 136  K 4.0 4.2  CL 109 106  CO2 23 22  GLUCOSE 153* 137*  BUN 31* 37*  CREATININE 1.42* 1.81*  CALCIUM 9.4 8.9   GFR Estimated Creatinine Clearance: 29.7 mL/min (A) (by C-G formula based on SCr of 1.81 mg/dL (H)). Liver Function Tests: No results for input(s): AST, ALT, ALKPHOS, BILITOT, PROT, ALBUMIN in the last 168 hours. No results for input(s): LIPASE, AMYLASE in the last 168 hours. No results for input(s):  AMMONIA in the last 168 hours. Coagulation profile  Recent Labs Lab 02/25/17 0802  INR 1.27    CBC:  Recent Labs Lab 02/25/17 0802  WBC 12.5*  HGB 8.8*  HCT 27.9*  MCV 86.6  PLT 224   CBG:  Recent Labs Lab 02/26/17 0610 02/26/17 1429 02/26/17 1814 02/26/17 2121 02/27/17 0620  GLUCAP 100* 137* 151* 153* 141*   Thyroid function studies:  Recent Labs  02/24/17 1543  TSH 0.140*    Microbiology No results found for this or any previous visit (from the past 240 hour(s)).  Procedures and diagnostic studies:  Dg Lumbar Spine 2-3 Views  Result Date: 02/26/2017 CLINICAL DATA:  Lumbar spine surgery. EXAM: LUMBAR SPINE - 2-3  VIEW; DG C-ARM 61-120 MIN COMPARISON:  Lumbar spine CT 02/24/2017 FLUOROSCOPY TIME:  C-arm fluoroscopic images were obtained intraoperatively and submitted for post operative interpretation. Please see the performing provider's procedural report for the fluoroscopy time utilized. FINDINGS: Two intraoperative spot fluoroscopic images of the lumbar spine are provided. Numbering is as on the prior CT, with prior anterior fusion plate and screws again noted at L5-S1 and with interbody implants at L3-4 and L4-5. Coflex devices have been removed. Posterior pedicle screw and rod fusion has been performed from L3-S1. IMPRESSION: Intraoperative images during L3-S1 fusion. Electronically Signed   By: Logan Bores M.D.   On: 02/26/2017 14:04   Dg Chest Port 1 View  Result Date: 02/26/2017 CLINICAL DATA:  Central line placement. EXAM: PORTABLE CHEST 1 VIEW COMPARISON:  07/06/2016 . FINDINGS: Right IJ line noted with tip over the superior vena cava. Cardiomegaly. Mild pulmonary venous congestion. Mild bilateral interstitial prominence. Mild CHF cannot be excluded. No pleural effusion or pneumothorax. IMPRESSION: 1. Right IJ line noted with tip projected over superior vena cava. 2. Cardiomegaly with mild pulmonary venous congestion and mild bilateral interstitial prominence. Mild CHF cannot be excluded. Electronically Signed   By: Marcello Moores  Register   On: 02/26/2017 15:41   Dg C-arm 61-120 Min  Result Date: 02/26/2017 CLINICAL DATA:  Lumbar spine surgery. EXAM: LUMBAR SPINE - 2-3 VIEW; DG C-ARM 61-120 MIN COMPARISON:  Lumbar spine CT 02/24/2017 FLUOROSCOPY TIME:  C-arm fluoroscopic images were obtained intraoperatively and submitted for post operative interpretation. Please see the performing provider's procedural report for the fluoroscopy time utilized. FINDINGS: Two intraoperative spot fluoroscopic images of the lumbar spine are provided. Numbering is as on the prior CT, with prior anterior fusion plate and screws again  noted at L5-S1 and with interbody implants at L3-4 and L4-5. Coflex devices have been removed. Posterior pedicle screw and rod fusion has been performed from L3-S1. IMPRESSION: Intraoperative images during L3-S1 fusion. Electronically Signed   By: Logan Bores M.D.   On: 02/26/2017 14:04    Medications:   . atorvastatin  40 mg Oral QPM  . bupivacaine liposome  20 mL Infiltration Once  . carvedilol  25 mg Oral BID  . cloNIDine  0.2 mg Oral BID  . docusate sodium  100 mg Oral BID  . furosemide  20 mg Oral Daily  . gabapentin  300 mg Oral TID  . hydrochlorothiazide  25 mg Oral Daily  . insulin aspart  0-15 Units Subcutaneous TID WC  . insulin aspart  0-5 Units Subcutaneous QHS  . levothyroxine  100 mcg Oral QAC breakfast  . losartan  100 mg Oral QPC breakfast  . oxyCODONE  10 mg Oral Q12H  . senna  1 tablet Oral BID  . sodium chloride flush  3 mL Intravenous Q12H   Continuous Infusions: . sodium chloride    . sodium chloride    . sodium chloride Stopped (02/26/17 3475)  . sodium chloride    . lactated ringers 10 mL/hr at 02/26/17 0728  . methocarbamol (ROBAXIN)  IV      Consult level II visit  LOS: 3 days   RAMA,CHRISTINA  [facility] Pager [phone]. If unable to reach me by pager, please call my cell phone at [phone].  *Please refer to [email or web address], password [email or web address] to get updated schedule on who will round on this patient, as hospitalists switch teams weekly. If 7PM-7AM, please contact night-coverage at [email or web address], password [email or web address] for any overnight needs.  02/27/2017, 8:49 AM

## 2017-02-28 DIAGNOSIS — N179 Acute kidney failure, unspecified: Secondary | ICD-10-CM

## 2017-02-28 DIAGNOSIS — R5383 Other fatigue: Secondary | ICD-10-CM

## 2017-02-28 HISTORY — DX: Other fatigue: R53.83

## 2017-02-28 HISTORY — DX: Acute kidney failure, unspecified: N17.9

## 2017-02-28 LAB — GLUCOSE, CAPILLARY
Glucose-Capillary: 128 mg/dL — ABNORMAL HIGH (ref 65–99)
Glucose-Capillary: 114 mg/dL — ABNORMAL HIGH (ref 65–99)
Glucose-Capillary: 136 mg/dL — ABNORMAL HIGH (ref 65–99)
Glucose-Capillary: 120 mg/dL — ABNORMAL HIGH (ref 65–99)

## 2017-02-28 LAB — T4, FREE: FREE T4: 1.47 ng/dL — AB (ref 0.61–1.12)

## 2017-02-28 MED ORDER — HYDROMORPHONE HCL 1 MG/ML IJ SOLN: 0.2500 mg | INTRAMUSCULAR | Status: DC | PRN

## 2017-02-28 MED ORDER — LEVOTHYROXINE SODIUM 88 MCG PO TABS
88.0000 ug | ORAL_TABLET | Freq: Every day | ORAL | Status: DC
  Administered 2017-02-28 – 2017-03-03 (×4): 88 ug via ORAL
  Filled 2017-02-28 (×3): qty 1

## 2017-02-28 MED ORDER — OXYCODONE-ACETAMINOPHEN 5-325 MG PO TABS
1.0000 | ORAL_TABLET | Freq: Three times a day (TID) | ORAL | Status: DC | PRN
  Administered 2017-03-01 – 2017-03-03 (×3): 1 via ORAL
  Filled 2017-02-28 (×3): qty 1

## 2017-02-28 MED ORDER — HYDROMORPHONE HCL 1 MG/ML IJ SOLN: 0.5000 mg | INTRAMUSCULAR | Status: DC | PRN

## 2017-02-28 NOTE — Progress Notes (Signed)
[name], female   DOB: 1941/03/08, 76 y.o.   MRN: [id number] States she's felling better, ate well this am, back sore, legs better

## 2017-02-28 NOTE — Progress Notes (Signed)
Progress Note    Taylor Tyler  UMP:[id number] DOB: [birth year]  DOA: 02/24/2017 PCP: Lenard Simmer, MD    Brief Narrative:   Reason for consultation: Blood pressure control/medical management  Medical records reviewed and are as summarized below:  BREASIA KARGES is an 76 y.o. female with a PMH of hypertension, diabetes, CKD, hypothyroidism who was admitted by the neurosurgical service for a two-stage elective lumbar surgery. Internal medicine was consulted for elevated blood pressure and medical management of chronic medical conditions.  Assessment/Plan:   Active Problems:   Lethargy Likely from sedating medications and build up of metabolites in the setting of impaired renal function.  Stop Oxycontin and Neurontin. Decrease oxycodone to 5 mg every 8 hours PRN severe pain, and stop Dilaudid. Resume IVF.    Accelerated hypertension Blood pressure has been slightly high systolically. Continue Catapres, Coreg and when necessary labetalol. Will need to hold Cozaar, Lasix and HCTZ given rising creatinine.    Diabetes mellitus with renal complications (HCC) CBGs well-controlled, 103-141, on moderate scale SSI.    Acute kidney injury/CKD (chronic kidney disease) stage 3, GFR 30-59 ml/min Creatinine rising. Baseline creatinine 1.4-1.6. Current creatinine 2.3. Hold Cozaar, Lasix and HCTZ. Hold Neurontin as with a decrease in her GFR, this is likely contributing to her lethargy. Would also decrease Dilaudid dose. Start IV fluids at 100 mL/h.    Status post lumbar surgery Per neurosurgery.    Hyperlipidemia Continue Lipitor.    Obesity, Class III, BMI 40-49.9 (morbid obesity) (HCC) Body mass index is 41 kg/m.    Hypothyroidism TSH slightly low, free T4 elevated indicating that she is over replaced. We'll decrease Synthroid to 88 g daily.   Subjective:   Very lethargic.   Objective:    Vitals:   02/27/17 1913 02/27/17 2103 02/28/17 0051 02/28/17 0530  BP: (!) 167/54  (!) 161/58 (!) 149/51 (!) 150/46  Pulse: 73 71 64 60  Resp: 18 20 18 18   Temp: 99.2 F (37.3 C) 99 F (37.2 C) 98.6 F (37 C) (!) 100.4 F (38 C)  TempSrc: Oral Oral Oral Oral  SpO2: 99% 99% 97% 98%  Weight:      Height:        Intake/Output Summary (Last 24 hours) at 02/28/17 0803 Last data filed at 02/28/17 0531  Gross per 24 hour  Intake                3 ml  Output              900 ml  Net             -897 ml   Filed Weights   02/24/17 0628 02/26/17 0725  Weight: 98.4 kg (217 lb) 98.4 kg (217 lb)    Exam: Unchanged form 02/27/17 except as noted in red: General exam: Chronically ill appearing female, very lethargic. Respiratory system: Lungs diminished but clear, no rhonchi. Cardiovascular system: Heart sounds are regular, S1, S2. No murmurs, rubs, or gallops. Gastrointestinal system: Abdomen is soft, nontender, nondistended with normal active bowel sounds. Central nervous system: Lethargic/somnolent.  Extremities: No edema.  SCDs on. Skin: No rashes, warm and dry. Psychiatry: Unable to assess.  Data Reviewed:   I have personally reviewed following labs and imaging studies:  Labs: Labs show the following:   Basic Metabolic Panel:  Recent Labs Lab 02/24/17 1543 02/25/17 0802 02/27/17 1045  NA 139 136 134*  K 4.0 4.2 4.6  CL 109 106 105  CO2 23 22 22   GLUCOSE 153* 137* 138*  BUN 31* 37* 53*  CREATININE 1.42* 1.81* 2.30*  CALCIUM 9.4 8.9 8.1*   GFR Estimated Creatinine Clearance: 23.4 mL/min (A) (by C-G formula based on SCr of 2.3 mg/dL (H)). Liver Function Tests: No results for input(s): AST, ALT, ALKPHOS, BILITOT, PROT, ALBUMIN in the last 168 hours. No results for input(s): LIPASE, AMYLASE in the last 168 hours. No results for input(s): AMMONIA in the last 168 hours. Coagulation profile  Recent Labs Lab 02/25/17 0802  INR 1.27    CBC:  Recent Labs Lab 02/25/17 0802  WBC 12.5*  HGB 8.8*  HCT 27.9*  MCV 86.6  PLT 224   CBG:  Recent  Labs Lab 02/27/17 0620 02/27/17 1154 02/27/17 1638 02/27/17 2105 02/28/17 0611  GLUCAP 141* 131* 103* 128* 128*   Thyroid function studies: No results for input(s): TSH, T4TOTAL, T3FREE, THYROIDAB in the last 72 hours.  Invalid input(s): FREET3  Microbiology No results found for this or any previous visit (from the past 240 hour(s)).  Procedures and diagnostic studies:  Dg Lumbar Spine 2-3 Views  Result Date: 02/26/2017 CLINICAL DATA:  Lumbar spine surgery. EXAM: LUMBAR SPINE - 2-3 VIEW; DG C-ARM 61-120 MIN COMPARISON:  Lumbar spine CT 02/24/2017 FLUOROSCOPY TIME:  C-arm fluoroscopic images were obtained intraoperatively and submitted for post operative interpretation. Please see the performing provider's procedural report for the fluoroscopy time utilized. FINDINGS: Two intraoperative spot fluoroscopic images of the lumbar spine are provided. Numbering is as on the prior CT, with prior anterior fusion plate and screws again noted at L5-S1 and with interbody implants at L3-4 and L4-5. Coflex devices have been removed. Posterior pedicle screw and rod fusion has been performed from L3-S1. IMPRESSION: Intraoperative images during L3-S1 fusion. Electronically Signed   By: Logan Bores M.D.   On: 02/26/2017 14:04   Dg Chest Port 1 View  Result Date: 02/26/2017 CLINICAL DATA:  Central line placement. EXAM: PORTABLE CHEST 1 VIEW COMPARISON:  07/06/2016 . FINDINGS: Right IJ line noted with tip over the superior vena cava. Cardiomegaly. Mild pulmonary venous congestion. Mild bilateral interstitial prominence. Mild CHF cannot be excluded. No pleural effusion or pneumothorax. IMPRESSION: 1. Right IJ line noted with tip projected over superior vena cava. 2. Cardiomegaly with mild pulmonary venous congestion and mild bilateral interstitial prominence. Mild CHF cannot be excluded. Electronically Signed   By: Marcello Moores  Register   On: 02/26/2017 15:41   Dg C-arm 61-120 Min  Result Date:  02/26/2017 CLINICAL DATA:  Lumbar spine surgery. EXAM: LUMBAR SPINE - 2-3 VIEW; DG C-ARM 61-120 MIN COMPARISON:  Lumbar spine CT 02/24/2017 FLUOROSCOPY TIME:  C-arm fluoroscopic images were obtained intraoperatively and submitted for post operative interpretation. Please see the performing provider's procedural report for the fluoroscopy time utilized. FINDINGS: Two intraoperative spot fluoroscopic images of the lumbar spine are provided. Numbering is as on the prior CT, with prior anterior fusion plate and screws again noted at L5-S1 and with interbody implants at L3-4 and L4-5. Coflex devices have been removed. Posterior pedicle screw and rod fusion has been performed from L3-S1. IMPRESSION: Intraoperative images during L3-S1 fusion. Electronically Signed   By: Logan Bores M.D.   On: 02/26/2017 14:04    Medications:   . atorvastatin  40 mg Oral QPM  . carvedilol  25 mg Oral BID  . cloNIDine  0.2 mg Oral BID  . docusate sodium  100 mg Oral BID  . furosemide  20 mg Oral Daily  .  gabapentin  300 mg Oral TID  . hydrochlorothiazide  25 mg Oral Daily  . insulin aspart  0-15 Units Subcutaneous TID WC  . insulin aspart  0-5 Units Subcutaneous QHS  . levothyroxine  100 mcg Oral QAC breakfast  . losartan  100 mg Oral QPC breakfast  . oxyCODONE  10 mg Oral Q12H  . senna  1 tablet Oral BID  . sodium chloride flush  3 mL Intravenous Q12H   Continuous Infusions: . sodium chloride    . sodium chloride Stopped (02/26/17 5947)  . lactated ringers 10 mL/hr at 02/26/17 0728  . methocarbamol (ROBAXIN)  IV      Consult level II visit  LOS: 4 days   Saturnino Liew  [facility] Pager [phone]. If unable to reach me by pager, please call my cell phone at [phone].  *Please refer to [email or web address], password [email or web address] to get updated schedule on who will round on this patient, as hospitalists switch teams weekly. If 7PM-7AM, please contact night-coverage at [email or web address], password [email or web address] for any overnight  needs.  02/28/2017, 8:03 AM

## 2017-03-01 LAB — GLUCOSE, CAPILLARY
GLUCOSE-CAPILLARY: 142 mg/dL — AB (ref 65–99)
Glucose-Capillary: 126 mg/dL — ABNORMAL HIGH (ref 65–99)
Glucose-Capillary: 159 mg/dL — ABNORMAL HIGH (ref 65–99)
Glucose-Capillary: 93 mg/dL (ref 65–99)

## 2017-03-01 LAB — BASIC METABOLIC PANEL
Anion gap: 9 (ref 5–15)
BUN: 59 mg/dL — AB (ref 6–20)
CALCIUM: 7.9 mg/dL — AB (ref 8.9–10.3)
CO2: 20 mmol/L — ABNORMAL LOW (ref 22–32)
CREATININE: 1.91 mg/dL — AB (ref 0.44–1.00)
Chloride: 107 mmol/L (ref 101–111)
GFR calc Af Amer: 29 mL/min — ABNORMAL LOW (ref >60)
GFR, EST NON AFRICAN AMERICAN: 25 mL/min — AB (ref >60)
GLUCOSE: 173 mg/dL — AB (ref 65–99)
Potassium: 4.1 mmol/L (ref 3.5–5.1)
SODIUM: 136 mmol/L (ref 135–145)

## 2017-03-01 MED ORDER — POLYETHYLENE GLYCOL 3350 17 G PO PACK
17.0000 g | PACK | Freq: Every day | ORAL | Status: DC
  Administered 2017-03-01 – 2017-03-03 (×3): 17 g via ORAL
  Filled 2017-03-01 (×3): qty 1

## 2017-03-01 NOTE — Progress Notes (Signed)
[name], female   DOB: [birth year], 76 y.o.   MRN: [id number] Overall she feels she is slowly improving. Less pain today. She moves her legs fairly well in bed, they are at least antigravity. She is awake and alert and pleasant and interactive. We'll try to mobilize.

## 2017-03-01 NOTE — Op Note (Signed)
02/24/2017 - 02/26/2017  8:58 PM  PATIENT:  Taylor Tyler  76 y.o. female  PRE-OPERATIVE DIAGNOSIS:  Lumbosacral spondylolisthesis; lumbosacral spondylosis with radiculopathy; Prior L5-S1 ALIF and L3-5 transpsoas lumbar interbody fusion  POST-OPERATIVE DIAGNOSIS:  Same  PROCEDURE:  Planned return to OR for removal of L3-4 and L4-5 Coflex devices, re-operative bilateral L3-4 and L4-5 laminectomies with L4-5 Gill procedure, L5-S1 initial laminectomy with facetectomy, posterior segmental instrumented fusion L3-S1  SURGEON:  Aldean Ast, MD  ASSISTANTS: Consuella Lose, MD  ANESTHESIA:   General  DRAINS: None   SPECIMEN:  None  INDICATION FOR PROCEDURE: 76 year old woman with intractable low back and leg pain. Patient understood the risks, benefits, and alternatives and potential outcomes and wished to proceed.  PROCEDURE DETAILS: After smooth induction of general endotracheal anesthesia the patient was turned to the prone position on an open Haviland table, positioned appropriately, and prepped and draped in the usual sterile fashion.  The existing scar was sharply excised and then extended cranially and caudally. Dissection was carried down in the midline with monopolar cautery.  I then performed subperiosteal dissection over the L3-4, L4-5 and L5-S1 lamina and facet joints to expose the transverse processes bilaterally.  The Mazor X robot was brought into the field and registered to the patient.  Using it as a drill guide I cannulated pedicle trajectories at L3, L4, L5, and S1 bilaterally.  I palpated these to confirm patency and then tapped over a K-wire.  I again palpated to confirm competency and then applied Surgiflo to obtain hemostasis.  I used the Leksell rongeur to resect the L3, L4, and L5 spinous processes with both Coflex devices.  I then used a straight curette to define the border between the cut bone edges of the laminotomy defects and the thecal sac at L3-4 and  L4-5 bilaterally.  I used the high speed drill and Kerrison rongeurs to expand the laminotomies.  I then performed a laminectomy of L5.  I used the high speed drill and osteotomes to fracture the L3, L4, and L5 inferior articular processes.  These were removed with a Leksell rongeur.  I then identified the L4, L5, and S1 nerve roots bilaterally and decompressed them along their courses in the lateral recesses and through the foramina.  I then placed pedicle screws into the previously drilled holes.  Rods were selected, secured in the screw with set screws, and finally tightened.  I irrigated vigorously with bacitracin saline.  I then decorticated the L3, L4, and L5 transverse processes and the sacral ala bilaterally.  Magnifuse and locally harvested autograft were placed over the decorticated surfaces.  Vancomycin powder was placed in the depths of the wound.  There was excellent hemostasis.  The wound was closed in routine anatomic layers with PDS stratafix suture.  The skin was closed with a running monocryl stratafix suture.  It was sealed with dermabond.  PATIENT DISPOSITION:  PACU - hemodynamically stable.   Delay start of Pharmacological VTE agent (>24hrs) due to surgical blood loss or risk of bleeding:  yes

## 2017-03-01 NOTE — Progress Notes (Signed)
Progress Note    Taylor Tyler  JXB:[id number] DOB: [birth year]  DOA: 02/24/2017 PCP: Lenard Simmer, MD    Brief Narrative:   Reason for consultation: Blood pressure control/medical management  Medical records reviewed and are as summarized below:  Taylor Tyler is an 76 y.o. female with a PMH of hypertension, diabetes, CKD, hypothyroidism who was admitted by the neurosurgical service for a two-stage elective lumbar surgery. Internal medicine was consulted for elevated blood pressure and medical management of chronic medical conditions.  Assessment/Plan:   Active Problems:   Lethargy Likely from sedating medications and build up of metabolites in the setting of impaired renal function.  OxyContin and Neurontin was discontinued 02/28/17 and her other pain medications were reduced. More awake today.    Accelerated hypertension Blood pressure stable. Continue Catapres, Coreg and when necessary labetalol. Continue to hold Cozaar, Lasix and HCTZ given elevated creatinine.    Diabetes mellitus with renal complications (HCC) CBGs well-controlled, 114-142, on moderate scale SSI.    Acute kidney injury/CKD (chronic kidney disease) stage 3, GFR 30-59 ml/min Creatinine rising. Baseline creatinine 1.4-1.6. Current creatinine 1.9. Continue to hold Neurontin Cozaar, Lasix and HCTZ. Continue gentle IV fluids.    Status post lumbar surgery Per neurosurgery.    Hyperlipidemia Continue Lipitor.    Obesity, Class III, BMI 40-49.9 (morbid obesity) (HCC) Body mass index is 41 kg/m.    Hypothyroidism TSH slightly low, free T4 elevated indicating that she is over replaced. Synthroid decreased to 88 g daily 02/28/17.   Subjective:   More awake and alert, reports back pain.  Constipated.  Appetite good.   Objective:    Vitals:   02/28/17 2105 03/01/17 0023 03/01/17 0431 03/01/17 0900  BP: (!) 135/45 (!) 125/58 (!) 148/48 (!) 155/44  Pulse: 62 60 (!) 58 (!) 58  Resp: 18 20 18 18     Temp: 98 F (36.7 C) 98.6 F (37 C) (!) 97.5 F (36.4 C) 98.7 F (37.1 C)  TempSrc: Oral Oral Oral Oral  SpO2: 99% 97% 99% 99%  Weight:      Height:        Intake/Output Summary (Last 24 hours) at 03/01/17 1336 Last data filed at 03/01/17 0830  Gross per 24 hour  Intake                0 ml  Output              875 ml  Net             -875 ml   Filed Weights   02/24/17 0628 02/26/17 0725  Weight: 98.4 kg (217 lb) 98.4 kg (217 lb)    Exam: General: No acute distress. Awake and alert. Cardiovascular: Heart sounds show a regular rate, and rhythm. No gallops or rubs. 2/6 SEM. No JVD. Lungs: Clear to auscultation bilaterally with good air movement. No rales, rhonchi or wheezes. Abdomen: Soft, nontender, nondistended with normal active bowel sounds. No masses. No hepatosplenomegaly. Neurological: Alert and oriented 3. Moves all extremities 4 with equal strength. Cranial nerves II through XII grossly intact. Skin: Warm and dry. No rashes or lesions. Extremities: No clubbing or cyanosis. No edema. SCDs on. Pedal pulses 2+. Psychiatric: Mood and affect are normal. Insight and judgment are good.  Data Reviewed:   I have personally reviewed following labs and imaging studies:  Labs: Labs show the following:   Basic Metabolic Panel:  Recent Labs Lab 02/24/17 1543 02/25/17 0802 02/27/17 1045  03/01/17 0958  NA 139 136 134* 136  K 4.0 4.2 4.6 4.1  CL 109 106 105 107  CO2 23 22 22  20*  GLUCOSE 153* 137* 138* 173*  BUN 31* 37* 53* 59*  CREATININE 1.42* 1.81* 2.30* 1.91*  CALCIUM 9.4 8.9 8.1* 7.9*   GFR Estimated Creatinine Clearance: 28.2 mL/min (A) (by C-G formula based on SCr of 1.91 mg/dL (H)). Liver Function Tests: No results for input(s): AST, ALT, ALKPHOS, BILITOT, PROT, ALBUMIN in the last 168 hours. No results for input(s): LIPASE, AMYLASE in the last 168 hours. No results for input(s): AMMONIA in the last 168 hours. Coagulation profile  Recent Labs Lab  02/25/17 0802  INR 1.27    CBC:  Recent Labs Lab 02/25/17 0802  WBC 12.5*  HGB 8.8*  HCT 27.9*  MCV 86.6  PLT 224   CBG:  Recent Labs Lab 02/28/17 1147 02/28/17 1635 02/28/17 2114 03/01/17 0622 03/01/17 1125  GLUCAP 114* 136* 120* 142* 126*    Procedures and diagnostic studies:  No results found.  Medications:   . atorvastatin  40 mg Oral QPM  . carvedilol  25 mg Oral BID  . cloNIDine  0.2 mg Oral BID  . docusate sodium  100 mg Oral BID  . insulin aspart  0-15 Units Subcutaneous TID WC  . insulin aspart  0-5 Units Subcutaneous QHS  . levothyroxine  88 mcg Oral QAC breakfast  . polyethylene glycol  17 g Oral Daily  . senna  1 tablet Oral BID   Continuous Infusions: . sodium chloride 100 mL/hr at 03/01/17 0445  . lactated ringers 10 mL/hr at 02/26/17 0728  . methocarbamol (ROBAXIN)  IV      Consult level II visit  LOS: 5 days   RAMA,CHRISTINA  [facility] Pager [phone]. If unable to reach me by pager, please call my cell phone at [phone].  *Please refer to [email or web address], password [email or web address] to get updated schedule on who will round on this patient, as hospitalists switch teams weekly. If 7PM-7AM, please contact night-coverage at [email or web address], password [email or web address] for any overnight needs.  03/01/2017, 1:36 PM

## 2017-03-01 NOTE — NC FL2 (Signed)
[place] LEVEL OF CARE SCREENING TOOL     IDENTIFICATION  Patient Name: Taylor Tyler Birthdate: 12-02-1940 Sex: female Admission Date (Current Location): 02/24/2017  [facility] and [place] Number:  Herbalist and Address:  [facility] [place]. [facility], [place] [address] [place], [place] [address]      Provider Number: [id number]  Attending Physician Name and Address:  Ditty, Kevan Ny, MD  Relative Name and Phone Number:       Current Level of Care: Hospital Recommended Level of Care: [place] Prior Approval Number:    Date Approved/Denied:   PASRR Number: [id number]  Discharge Plan: SNF    Current Diagnoses: Patient Active Problem List   Diagnosis Date Noted  . AKI (acute kidney injury) ([place]) 02/28/2017  . Lethargy 02/28/2017  . Obesity, Class III, BMI 40-49.9 (morbid obesity) ([place]) 02/27/2017  . DM (diabetes mellitus), type 2 with renal complications ([place]) 71/01/2693  . Status post lumbar surgery 02/24/2017  . Hypertensive emergency 01/15/2017  . CKD (chronic kidney disease) stage 3, GFR 30-59 ml/min 01/15/2017  . Hypothyroidism 01/15/2017  . Diverticulosis 01/15/2017  . HLD (hyperlipidemia) 01/15/2017  . Spinal stenosis of lumbar region 01/15/2017  . Low back pain 09/12/2016  . Chest pain 09/05/2016  . Accelerated hypertension 07/06/2016  . Disease of pancreas 07/21/2012    Orientation RESPIRATION BLADDER Height & Weight     Self, Time, Situation, Place  Normal Continent Weight: 217 lb (98.4 kg) Height:  5\' 1"  (154.9 cm)  BEHAVIORAL SYMPTOMS/MOOD NEUROLOGICAL BOWEL NUTRITION STATUS      Continent Diet (Heart healthy / Carb Modified with Thin Liquids)  AMBULATORY STATUS COMMUNICATION OF NEEDS Skin   Extensive Assist Verbally Surgical wounds (Abdomen / Flank / Back Incisions)                       Personal Care Assistance Level of Assistance  Bathing, Feeding, Dressing Bathing Assistance: Limited  assistance Feeding assistance: Limited assistance Dressing Assistance: Limited assistance     Functional Limitations Info  Sight, Hearing, Speech Sight Info: Adequate Hearing Info: Adequate Speech Info: Adequate    SPECIAL CARE FACTORS FREQUENCY  PT (By licensed PT), OT (By licensed OT)     PT Frequency: 3x week OT Frequency: 3x week            Contractures Contractures Info: Not present    Additional Factors Info  Code Status, Allergies, Isolation Precautions, Insulin Sliding Scale Code Status Info: Full Code Allergies Info: Hyrdralazine / Shellfish   Insulin Sliding Scale Info: Novolog 3 times daily with meals and daily at bedtime Isolation Precautions Info: 5.31.18 Surgical pcr + (MRSA)     Current Medications (03/01/2017):  This is the current hospital active medication list Current Facility-Administered Medications  Medication Dose Route Frequency Provider Last Rate Last Dose  . 0.9 %  sodium chloride infusion   Intravenous Continuous Rama, Venetia Maxon, MD 100 mL/hr at 03/01/17 0445    . acetaminophen (TYLENOL) tablet 650 mg  650 mg Oral Q4H PRN Traci Sermon, PA-C   650 mg at 02/28/17 0542   Or  . acetaminophen (TYLENOL) suppository 650 mg  650 mg Rectal Q4H PRN Costella, Vista Mink, PA-C      . atorvastatin (LIPITOR) tablet 40 mg  40 mg Oral QPM Donne Hazel, MD   40 mg at 02/28/17 1836  . bisacodyl (DULCOLAX) suppository 10 mg  10 mg Rectal Daily PRN Ferne Reus  J, PA-C   10 mg at 03/01/17 0626  . carvedilol (COREG) tablet 25 mg  25 mg Oral BID Donne Hazel, MD   25 mg at 03/01/17 0913  . cloNIDine (CATAPRES) tablet 0.2 mg  0.2 mg Oral BID Donne Hazel, MD   0.2 mg at 03/01/17 0913  . docusate sodium (COLACE) capsule 100 mg  100 mg Oral BID Traci Sermon, PA-C   100 mg at 03/01/17 0913  . insulin aspart (novoLOG) injection 0-15 Units  0-15 Units Subcutaneous TID WC Donne Hazel, MD   2 Units at 03/01/17 1244  . insulin aspart (novoLOG)  injection 0-5 Units  0-5 Units Subcutaneous QHS Donne Hazel, MD      . labetalol (NORMODYNE,TRANDATE) injection 5 mg  5 mg Intravenous Q2H PRN Donne Hazel, MD      . lactated ringers infusion   Intravenous Continuous Roderic Palau, MD 10 mL/hr at 02/26/17 [phone]    . levothyroxine (SYNTHROID, LEVOTHROID) tablet 88 mcg  88 mcg Oral QAC breakfast Rama, Venetia Maxon, MD   88 mcg at 03/01/17 0800  . menthol-cetylpyridinium (CEPACOL) lozenge 3 mg  1 lozenge Oral PRN Traci Sermon, PA-C   3 mg at 02/25/17 0370   Or  . phenol (CHLORASEPTIC) mouth spray 1 spray  1 spray Mouth/Throat PRN Costella, Vista Mink, PA-C      . methocarbamol (ROBAXIN) tablet 500 mg  500 mg Oral Q6H PRN Costella, Vincent J, PA-C   500 mg at 02/24/17 1358   Or  . methocarbamol (ROBAXIN) 500 mg in dextrose 5 % 50 mL IVPB  500 mg Intravenous Q6H PRN Costella, Vincent J, PA-C      . ondansetron (ZOFRAN) tablet 4 mg  4 mg Oral Q6H PRN Costella, Vincent J, PA-C       Or  . ondansetron (ZOFRAN) injection 4 mg  4 mg Intravenous Q6H PRN Costella, Vincent J, PA-C   4 mg at 02/25/17 0847  . oxyCODONE-acetaminophen (PERCOCET/ROXICET) 5-325 MG per tablet 1 tablet  1 tablet Oral Q8H PRN Rama, Christina P, MD      . polyethylene glycol (MIRALAX / GLYCOLAX) packet 17 g  17 g Oral Daily Rama, Venetia Maxon, MD   17 g at 03/01/17 1136  . senna (SENOKOT) tablet 8.6 mg  1 tablet Oral BID Costella, Vista Mink, PA-C   8.6 mg at 03/01/17 0913  . senna-docusate (Senokot-S) tablet 1 tablet  1 tablet Oral QHS PRN Costella, Vista Mink, PA-C      . sodium phosphate (FLEET) 7-19 GM/118ML enema 1 enema  1 enema Rectal Once PRN Costella, Vista Mink, PA-C         Discharge Medications: Please see discharge summary for a list of discharge medications.  Relevant Imaging Results:  Relevant Lab Results:   Additional Information SSN [id number]   Barbette Or, [place]

## 2017-03-02 LAB — GLUCOSE, CAPILLARY
GLUCOSE-CAPILLARY: 143 mg/dL — AB (ref 65–99)
GLUCOSE-CAPILLARY: 143 mg/dL — AB (ref 65–99)
Glucose-Capillary: 118 mg/dL — ABNORMAL HIGH (ref 65–99)
Glucose-Capillary: 118 mg/dL — ABNORMAL HIGH (ref 65–99)

## 2017-03-02 LAB — BASIC METABOLIC PANEL
Anion gap: 6 (ref 5–15)
BUN: 55 mg/dL — AB (ref 6–20)
CALCIUM: 8.2 mg/dL — AB (ref 8.9–10.3)
CO2: 20 mmol/L — ABNORMAL LOW (ref 22–32)
CREATININE: 1.65 mg/dL — AB (ref 0.44–1.00)
Chloride: 110 mmol/L (ref 101–111)
GFR calc Af Amer: 34 mL/min — ABNORMAL LOW (ref >60)
GFR, EST NON AFRICAN AMERICAN: 30 mL/min — AB (ref >60)
Glucose, Bld: 114 mg/dL — ABNORMAL HIGH (ref 65–99)
Potassium: 4.2 mmol/L (ref 3.5–5.1)
SODIUM: 136 mmol/L (ref 135–145)

## 2017-03-02 LAB — POCT I-STAT 7, (LYTES, BLD GAS, ICA,H+H)
ACID-BASE DEFICIT: 8 mmol/L — AB (ref 0.0–2.0)
Bicarbonate: 17.8 mmol/L — ABNORMAL LOW (ref 20.0–28.0)
CALCIUM ION: 1.08 mmol/L — AB (ref 1.15–1.40)
HCT: 23 % — ABNORMAL LOW (ref 36.0–46.0)
HEMOGLOBIN: 7.8 g/dL — AB (ref 12.0–15.0)
O2 Saturation: 100 %
PH ART: 7.297 — AB (ref 7.350–7.450)
PO2 ART: 239 mmHg — AB (ref 83.0–108.0)
POTASSIUM: 4.5 mmol/L (ref 3.5–5.1)
SODIUM: 135 mmol/L (ref 135–145)
TCO2: 19 mmol/L (ref 0–100)
pCO2 arterial: 36.5 mmHg (ref 32.0–48.0)

## 2017-03-02 MED ORDER — LOSARTAN POTASSIUM 50 MG PO TABS
100.0000 mg | ORAL_TABLET | Freq: Every day | ORAL | Status: DC
  Administered 2017-03-03: 100 mg via ORAL
  Filled 2017-03-02: qty 2

## 2017-03-02 MED ORDER — ENOXAPARIN SODIUM 40 MG/0.4ML ~~LOC~~ SOLN
40.0000 mg | SUBCUTANEOUS | Status: DC
  Administered 2017-03-02 – 2017-03-03 (×2): 40 mg via SUBCUTANEOUS
  Filled 2017-03-02 (×2): qty 0.4

## 2017-03-02 NOTE — Progress Notes (Signed)
Physical Therapy Treatment Patient Details Name: Taylor Tyler MRN: [id number] DOB: 05/24/1941 Today's Date: 03/02/2017    History of Present Illness Pt is a 76 y/o female s/p 2 part back surgery: Stage 1: L5-S1 Anterior lumbar interbody fusion; L3-5 Anterior lateral lumbar interbody fusion with removal of coflex Stage 2:  L5-S1 Laminectomy with facetectomies for decompression, L4-5 Laminectomy with Gill procedure, L3-S1 Dorsal internal fixation with Mazor robot. Pt has a past medical history of Anemia; Arthritis; Chronic kidney disease; Diabetes mellitus; Disease of pancreas; DJD; Hypertension; previous back surgeries and Hypothyroidism.    PT Comments    Patient seen for mobility and activity progression. Tolerated supine there ex, EOB and OOB to chair with 2 persons maximal assist. At this time, patient unable to maintain upright positioning. Extremely weak. SNF remains appropriate.   Follow Up Recommendations  SNF;Supervision/Assistance - 24 hour     Equipment Recommendations  None recommended by PT    Recommendations for Other Services       Precautions / Restrictions Precautions Precautions: Fall;Back Precaution Booklet Issued: Yes (comment) Precaution Comments: therapists reviewed 3/3 back precautions with pt throughout session Required Braces or Orthoses: Spinal Brace Spinal Brace: Lumbar corset;Applied in sitting position Restrictions Weight Bearing Restrictions: No    Mobility  Bed Mobility Overal bed mobility: Needs Assistance Bed Mobility: Rolling;Sidelying to Sit;Sit to Sidelying Rolling: Mod assist Sidelying to sit: Max assist;+2 for physical assistance     Sit to sidelying: Max assist;+2 for physical assistance General bed mobility comments: significant reliance on physical assist, cues and side rails  Transfers Overall transfer level: Needs assistance Equipment used: 2 person hand held assist (face to face with gait belt and chuck pad) Transfers: Sit  to/from Stand Sit to Stand: Max assist;+2 physical assistance         General transfer comment: Assist to power up and take pivotal steps to chair, decreased activity tolerance and overall strength  Ambulation/Gait             General Gait Details: unable to perform at this time   Stairs            Wheelchair Mobility    Modified Rankin (Stroke Patients Only)       Balance Overall balance assessment: Needs assistance Sitting-balance support: Feet supported Sitting balance-Leahy Scale: Poor Sitting balance - Comments: required hands on assist for upright posture initially Postural control: Posterior lean Standing balance support: Bilateral upper extremity supported Standing balance-Leahy Scale: Poor Standing balance comment: max A x2                            Cognition Arousal/Alertness: Awake/alert Behavior During Therapy: Flat affect Overall Cognitive Status: Impaired/Different from baseline Area of Impairment: Attention;Memory;Following commands;Safety/judgement;Problem solving                   Current Attention Level: Sustained Memory: Decreased short-term memory;Decreased recall of precautions Following Commands: Follows one step commands with increased time Safety/Judgement: Decreased awareness of safety;Decreased awareness of deficits   Problem Solving: Slow processing;Decreased initiation;Difficulty sequencing;Requires verbal cues;Requires tactile cues General Comments: pt required multimodal cueing and review of back precautions throughout. When given verbal cueing to "use your arms" on RW to assist with stability, pt responded "what arms?"      Exercises      General Comments General comments (skin integrity, edema, etc.): assisted with hygiene and bed mobility, LEs exercises performed supine bilaterally  Pertinent Vitals/Pain Pain Assessment: Faces Faces Pain Scale: Hurts little more Pain Location: back Pain  Descriptors / Indicators: Sore Pain Intervention(s): Monitored during session    Home Living                      Prior Function            PT Goals (current goals can now be found in the care plan section) Acute Rehab PT Goals Patient Stated Goal: to get stronger PT Goal Formulation: With patient Time For Goal Achievement: 03/10/17 Potential to Achieve Goals: Fair Progress towards PT goals: Progressing toward goals (modest progression, tolerated OOB to chair and bed mobility)    Frequency    Min 5X/week      PT Plan Current plan remains appropriate    Co-evaluation              AM-PAC PT "6 Clicks" Daily Activity  Outcome Measure  Difficulty turning over in bed (including adjusting bedclothes, sheets and blankets)?: Total Difficulty moving from lying on back to sitting on the side of the bed? : Total Difficulty sitting down on and standing up from a chair with arms (e.g., wheelchair, bedside commode, etc,.)?: Total Help needed moving to and from a bed to chair (including a wheelchair)?: Total Help needed walking in hospital room?: Total Help needed climbing 3-5 steps with a railing? : Total 6 Click Score: 6    End of Session Equipment Utilized During Treatment: Other (comment) Charlaine Dalton) Activity Tolerance: Patient limited by lethargy Patient left: in chair;with call bell/phone within reach;with family/visitor present Nurse Communication: Mobility status;Precautions PT Visit Diagnosis: Unsteadiness on feet (R26.81);Muscle weakness (generalized) (M62.81);Pain Pain - part of body:  (back)     Time: 0940-1006 PT Time Calculation (min) (ACUTE ONLY): 26 min  Charges:  $Therapeutic Activity: 23-37 mins                    G Codes:       Alben Deeds, PT DPT  Board Certified Neurologic Specialist [place] 03/02/2017, 10:13 AM

## 2017-03-02 NOTE — Care Management Important Message (Signed)
Important Message  Patient Details  Name: Taylor Tyler MRN: [id number] Date of Birth: 1941/01/28   Medicare Important Message Given:  Yes    Orbie Pyo 03/02/2017, 2:47 PM

## 2017-03-02 NOTE — Progress Notes (Signed)
Return visit to assist patient with mobility and return to bed. Patient tolerated 2 hours OOB to chair but remains lethargic. Patient wishing to stay in chair, educated patient on importance of positional changes and need for pressure relief on buttocks. Assist patient to upright and pivot to bed. Patient continues to required significant assist. SNF remains plan.    03/02/17 1300  PT Visit Information  Last PT Received On 03/02/17  Assistance Needed +2 ((+3 helpful))  History of Present Illness Pt is a 76 y/o female s/p 2 part back surgery: Stage 1: L5-S1 Anterior lumbar interbody fusion; L3-5 Anterior lateral lumbar interbody fusion with removal of coflex Stage 2:  L5-S1 Laminectomy with facetectomies for decompression, L4-5 Laminectomy with Gill procedure, L3-S1 Dorsal internal fixation with Mazor robot. Pt has a past medical history of Anemia; Arthritis; Chronic kidney disease; Diabetes mellitus; Disease of pancreas; DJD; Hypertension; previous back surgeries and Hypothyroidism.  Subjective Data  Subjective agreeable to mobilize, eager to get OOB  Patient Stated Goal to get stronger  Precautions  Precautions Fall;Back  Precaution Booklet Issued Yes (comment)  Precaution Comments therapists reviewed 3/3 back precautions with pt throughout session  Required Braces or Orthoses Spinal Brace  Spinal Brace Lumbar corset;Applied in sitting position  Restrictions  Weight Bearing Restrictions No  Pain Assessment  Pain Assessment Faces  Faces Pain Scale 4  Pain Location back  Pain Descriptors / Indicators Sore  Pain Intervention(s) Repositioned  Cognition  Arousal/Alertness Lethargic  Behavior During Therapy Flat affect  Overall Cognitive Status Impaired/Different from baseline  Area of Impairment Attention;Memory;Following commands;Safety/judgement;Problem solving  Current Attention Level Sustained  Memory Decreased short-term memory;Decreased recall of precautions  Following Commands  Follows one step commands with increased time  Safety/Judgement Decreased awareness of safety;Decreased awareness of deficits  Problem Solving Slow processing;Decreased initiation;Difficulty sequencing;Requires verbal cues;Requires tactile cues  General Comments continues to have decreased initiation and slow processing throughout activity  Bed Mobility  Overal bed mobility Needs Assistance  Bed Mobility Rolling;Sit to Sidelying  Rolling Mod assist  Sit to sidelying Mod assist  General bed mobility comments moderate assist to elevate LEs back to bed and control trunk to bed.   Transfers  Overall transfer level Needs assistance  Equipment used Rolling walker (2 wheeled)  Transfers Sit to/from [employer] Transfers  Sit to Stand Max assist;+2 physical assistance  Stand pivot transfers Mod assist;+2 physical assistance  General transfer comment Max assist to power up to standing, manual faciliatation of hand placement on RW. Cues for upright positioning. +2 moderate assist for pivotal steps to bed with cues to not flex prematurely.  Ambulation/Gait  General Gait Details unable to perform at this time  Balance  Overall balance assessment Needs assistance  Sitting-balance support Feet supported  Sitting balance-Leahy Scale Poor  Sitting balance - Comments required hands on assist for upright posture initially  Postural control Posterior lean  Standing balance support Bilateral upper extremity supported  Standing balance-Leahy Scale Poor  Standing balance comment max A x2  Exercises  Exercises (Ankle ROM against resistance, SLR AAROM, Heel slides AAROM)  PT - End of Session  Equipment Utilized During Treatment Other (comment) Charlaine Dalton)  Activity Tolerance Patient limited by lethargy  Patient left in bed;with call bell/phone within reach;with bed alarm set  Nurse Communication Mobility status;Precautions  PT - Assessment/Plan  PT Plan Current plan remains appropriate  PT Visit  Diagnosis Unsteadiness on feet (R26.81);Muscle weakness (generalized) (M62.81);Pain  Pain - part of body (back)  PT Frequency (ACUTE  ONLY) Min 5X/week  Follow Up Recommendations SNF;Supervision/Assistance - 24 hour  PT equipment None recommended by PT  AM-PAC PT "6 Clicks" Daily Activity Outcome Measure  Difficulty turning over in bed (including adjusting bedclothes, sheets and blankets)? 1  Difficulty moving from lying on back to sitting on the side of the bed?  1  Difficulty sitting down on and standing up from a chair with arms (e.g., wheelchair, bedside commode, etc,.)? 1  Help needed moving to and from a bed to chair (including a wheelchair)? 1  Help needed walking in hospital room? 1  Help needed climbing 3-5 steps with a railing?  1  6 Click Score 6  Mobility G Code  CN  PT Goal Progression  Progress towards PT goals Progressing toward goals (modest)  Acute Rehab PT Goals  PT Goal Formulation With patient  Time For Goal Achievement 03/10/17  Potential to Achieve Goals Fair  PT Time Calculation  PT Start Time (ACUTE ONLY) 1216  PT Stop Time (ACUTE ONLY) 1231  PT Time Calculation (min) (ACUTE ONLY) 15 min  PT General Charges  $$ ACUTE PT VISIT 1 Visit  PT Treatments  $Therapeutic Activity 8-22 mins     Alben Deeds, PT DPT  Board Certified Neurologic Specialist [phone]

## 2017-03-02 NOTE — Progress Notes (Signed)
Pt seen and examined. No issues overnight.  EXAM: Temp:  [98.3 F (36.8 C)-99.2 F (37.3 C)] 99 F (37.2 C) (07/30 0945) Pulse Rate:  [55-62] 57 (07/30 0945) Resp:  [16-18] 18 (07/30 0945) BP: (145-178)/(46-61) 178/46 (07/30 0945) SpO2:  [98 %-100 %] 100 % (07/30 0945) Intake/Output      07/29 0701 - 07/30 0700 07/30 0701 - 07/31 0700   Urine (mL/kg/hr) 650 (0.3)    Drains 15    Total Output 665     Net -665          Urine Occurrence 1 x 1 x   Stool Occurrence 3 x 1 x    Awake and alert Follows commands throughout Full strength BLE Incisions c/d/i  Stable Appreciate Medicine recs Dispo planning

## 2017-03-02 NOTE — Clinical Social Work Note (Signed)
Clinical Social Work Assessment  Patient Details  Name: Taylor Tyler MRN: [id number] Date of Birth: [birth year]  Date of referral:  03/02/17               Reason for consult:  Facility Placement, Discharge Planning                Permission sought to share information with:  Facility Sport and exercise psychologist, Family Supports Permission granted to share information::  Yes, Verbal Permission Granted  Name::     Taylor Tyler  Agency::  SNF  Relationship::  Engineer, manufacturing systems Information:     Housing/Transportation Living arrangements for the past 2 months:  [place] of Information:  Patient, Adult Children Patient Interpreter Needed:  None Criminal Activity/Legal Involvement Pertinent to Current Situation/Hospitalization:  No - Comment as needed Significant Relationships:  Adult Children Lives with:  Self Do you feel safe going back to the place where you live?  Yes Need for family participation in patient care:  No (Coment)  Care giving concerns:  Patient lives at home alone and currently requires significant assistance to mobilize and meet needs. Patient would benefit from short term rehab stay prior to returning home in order to improve mobility and ability to independently care for self.   Social Worker assessment / plan:  CSW met with patient to discuss SNF recommendation. Patient indicated that she wanted to go home, and that her sons check on her. CSW explained concern about the level of assistance patient requires at this time and that she will need 24/7 care at discharge, and patient discussed that her sons could stay with her. CSW contacted patient's son, Taylor Tyler, by phone to discuss discharge planning concerns. CSW explained that patient is refusing SNF at this time but is not safe to return home. Patient's son indicated that they have been through this process before at previous surgeries, but they can't provide the care that the patient needs and she will need to go to  a facility for a while after she leaves the hospital. Patient's son indicated that he will be coming up to the hospital this afternoon/evening with his brother to convince her to go, and will check back in afterwards.   Employment status:  Retired Nurse, adult PT Recommendations:  [place] / Referral to community resources:  [place]  Patient/Family's Response to care:  Patient is currently not realistic about the level of care that she needs, and is wanting to go home. Patient doesn't seem to understand that it's not safe at this time, and that she will be unable to care for herself.   Patient/Family's Understanding of and Emotional Response to Diagnosis, Current Treatment, and Prognosis:  Patient is not understanding of her current diagnosis and need for additional rehabilitation after leaving the hospital. Patient's son is understanding of the need for SNF placement at this time and will try to convince the patient to be agreeable to go.   Emotional Assessment Appearance:  Appears stated age Attitude/Demeanor/Rapport:    Affect (typically observed):  Appropriate Orientation:  Oriented to Situation, Oriented to  Time, Oriented to Place, Oriented to Self Alcohol / Substance use:  Not Applicable Psych involvement (Current and /or in the community):  No (Comment)  Discharge Needs  Concerns to be addressed:  Care Coordination, Discharge Planning Concerns, Patient refuses services, Denies Needs/Concerns at this time Readmission within the last 30 days:    Current discharge risk:  Lives  alone, Physical Impairment Barriers to Discharge:  Continued Medical Work up   [employer], [place] 03/02/2017, 11:41 AM

## 2017-03-02 NOTE — Progress Notes (Signed)
Progress Note    Taylor Tyler  RSW:[id number] DOB: [birth year]  DOA: 02/24/2017 PCP: Lenard Simmer, MD    Brief Narrative:   Reason for consultation: Blood pressure control/medical management  Medical records reviewed and are as summarized below:  Taylor Tyler is an 76 y.o. female with a PMH of hypertension, diabetes, CKD, hypothyroidism who was admitted by the neurosurgical service for a two-stage elective lumbar surgery. Internal medicine was consulted for elevated blood pressure and medical management of chronic medical conditions.  Assessment/Plan:   Active Problems:   Lethargy Likely from sedating medications and build up of metabolites in the setting of impaired renal function.  OxyContin and Neurontin was discontinued 02/28/17 and her other pain medications were reduced. Sleepy this morning. Would continue to hold sedating medications as much as possible.    Accelerated hypertension Blood pressure stable. Continue Catapres, Coreg and when necessary labetalol. Continue to hold Lasix and HCTZ. Resume Cozaar given improvement in creatinine. Continue to hold diuretics.    Diabetes mellitus with renal complications (HCC) CBGs well-controlled, 114-142, on moderate scale SSI.    Acute kidney injury/CKD (chronic kidney disease) stage 3, GFR 30-59 ml/min Creatinine improving. Baseline creatinine 1.4-1.6. Current creatinine 1.65. Continue to hold Neurontin Lasix and HCTZ. Creatinine now back to baseline values. NSL IV.     Status post lumbar surgery Per neurosurgery.    Hyperlipidemia Continue Lipitor.    Obesity, Class III, BMI 40-49.9 (morbid obesity) (HCC) Body mass index is 41 kg/m.    Hypothyroidism TSH slightly low, free T4 elevated indicating that she is over replaced. Synthroid decreased to 88 g daily 02/28/17.   Subjective:   Sitting up, asleep in chair. Disoriented when awakened. Reports some improvement in back pain.  Objective:    Vitals:   03/01/17 2113 03/02/17 0117 03/02/17 0531 03/02/17 0945  BP: (!) 155/53 (!) 145/56 (!) 162/46 (!) 178/46  Pulse: (!) 59 62 (!) 55 (!) 57  Resp: 16 16 18 18   Temp: 99.2 F (37.3 C) 98.9 F (37.2 C) 99 F (37.2 C) 99 F (37.2 C)  TempSrc: Oral Oral Oral Oral  SpO2: 99% 98% 100% 100%  Weight:      Height:        Intake/Output Summary (Last 24 hours) at 03/02/17 1111 Last data filed at 03/01/17 1524  Gross per 24 hour  Intake                0 ml  Output              650 ml  Net             -650 ml   Filed Weights   02/24/17 0628 02/26/17 0725  Weight: 98.4 kg (217 lb) 98.4 kg (217 lb)    Exam: General: Asleep sitting up in chair. Cardiovascular: Heart sounds show a regular rate, and rhythm. No gallops or rubs. 2/6 systolic ejection murmur. No JVD. Lungs: Clear to auscultation bilaterally with fair air movement. No rales, rhonchi or wheezes. Abdomen: Soft, nontender, nondistended with normal active bowel sounds. No masses. No hepatosplenomegaly. Neurological: More lethargic today. Skin: Warm and dry. No rashes or lesions. Extremities: No clubbing or cyanosis. No edema. Pedal pulses 2+. .  Data Reviewed:   I have personally reviewed following labs and imaging studies:  Labs: Labs show the following:   Basic Metabolic Panel:  Recent Labs Lab 02/24/17 1543 02/25/17 0802 02/26/17 1324 02/27/17 1045 03/01/17 0938 03/02/17 1829  NA 139 136 135 134* 136 136  K 4.0 4.2 4.5 4.6 4.1 4.2  CL 109 106  --  105 107 110  CO2 23 22  --  22 20* 20*  GLUCOSE 153* 137*  --  138* 173* 114*  BUN 31* 37*  --  53* 59* 55*  CREATININE 1.42* 1.81*  --  2.30* 1.91* 1.65*  CALCIUM 9.4 8.9  --  8.1* 7.9* 8.2*   GFR Estimated Creatinine Clearance: 32.6 mL/min (A) (by C-G formula based on SCr of 1.65 mg/dL (H)). Liver Function Tests: No results for input(s): AST, ALT, ALKPHOS, BILITOT, PROT, ALBUMIN in the last 168 hours. No results for input(s): LIPASE, AMYLASE in the last 168  hours. No results for input(s): AMMONIA in the last 168 hours. Coagulation profile  Recent Labs Lab 02/25/17 0802  INR 1.27    CBC:  Recent Labs Lab 02/25/17 0802 02/26/17 1324  WBC 12.5*  --   HGB 8.8* 7.8*  HCT 27.9* 23.0*  MCV 86.6  --   PLT 224  --    CBG:  Recent Labs Lab 03/01/17 0622 03/01/17 1125 03/01/17 1602 03/01/17 2107 03/02/17 0611  GLUCAP 142* 126* 159* 93 118*    Procedures and diagnostic studies:  No results found.  Medications:   . atorvastatin  40 mg Oral QPM  . carvedilol  25 mg Oral BID  . cloNIDine  0.2 mg Oral BID  . docusate sodium  100 mg Oral BID  . enoxaparin (LOVENOX) injection  40 mg Subcutaneous Q24H  . insulin aspart  0-15 Units Subcutaneous TID WC  . insulin aspart  0-5 Units Subcutaneous QHS  . levothyroxine  88 mcg Oral QAC breakfast  . polyethylene glycol  17 g Oral Daily  . senna  1 tablet Oral BID   Continuous Infusions: . sodium chloride 100 mL/hr at 03/01/17 0445  . lactated ringers 10 mL/hr at 02/26/17 0728  . methocarbamol (ROBAXIN)  IV      Consult level II visit  LOS: 6 days   Pau Banh  [facility] Pager [phone]. If unable to reach me by pager, please call my cell phone at [phone].  *Please refer to [email or web address], password [email or web address] to get updated schedule on who will round on this patient, as hospitalists switch teams weekly. If 7PM-7AM, please contact night-coverage at [email or web address], password [email or web address] for any overnight needs.  03/02/2017, 11:11 AM

## 2017-03-03 LAB — BASIC METABOLIC PANEL
ANION GAP: 8 (ref 5–15)
BUN: 51 mg/dL — ABNORMAL HIGH (ref 6–20)
CHLORIDE: 108 mmol/L (ref 101–111)
CO2: 21 mmol/L — AB (ref 22–32)
Calcium: 8.3 mg/dL — ABNORMAL LOW (ref 8.9–10.3)
Creatinine, Ser: 1.53 mg/dL — ABNORMAL HIGH (ref 0.44–1.00)
GFR calc non Af Amer: 33 mL/min — ABNORMAL LOW (ref >60)
GFR, EST AFRICAN AMERICAN: 38 mL/min — AB (ref >60)
Glucose, Bld: 163 mg/dL — ABNORMAL HIGH (ref 65–99)
POTASSIUM: 4.4 mmol/L (ref 3.5–5.1)
Sodium: 137 mmol/L (ref 135–145)

## 2017-03-03 LAB — GLUCOSE, CAPILLARY
GLUCOSE-CAPILLARY: 111 mg/dL — AB (ref 65–99)
Glucose-Capillary: 144 mg/dL — ABNORMAL HIGH (ref 65–99)

## 2017-03-03 MED ORDER — CYCLOBENZAPRINE HCL 5 MG PO TABS: 5.0000 mg | ORAL_TABLET | Freq: Three times a day (TID) | ORAL | 0 refills | Status: DC | PRN

## 2017-03-03 MED ORDER — OXYCODONE-ACETAMINOPHEN 5-325 MG PO TABS: 1.0000 | ORAL_TABLET | Freq: Four times a day (QID) | ORAL | 0 refills | Status: DC | PRN

## 2017-03-03 NOTE — Progress Notes (Signed)
Progress Note    Taylor Tyler  GUY:[id number] DOB: 08-Jul-1941  DOA: 02/24/2017 PCP: Lenard Simmer, MD    Brief Narrative:   Reason for consultation: Blood pressure control/medical management  Medical records reviewed and are as summarized below:  Taylor Tyler is an 76 y.o. female with a PMH of hypertension, diabetes, CKD, hypothyroidism who was admitted by the neurosurgical service for a two-stage elective lumbar surgery. Internal medicine was consulted for elevated blood pressure and medical management of chronic medical conditions.  Assessment/Plan:   Active Problems:   Lethargy Likely from sedating medications and build up of metabolites in the setting of impaired renal function.  OxyContin and Neurontin was discontinued 02/28/17 and her other pain medications were reduced. Sleepy this morning. Would continue to hold sedating medications as much as possible.    Accelerated hypertension Blood pressure stable. Continue Catapres, Coreg and when necessary labetalol. Continue to hold Lasix and HCTZ. Resume Cozaar given improvement in creatinine. OK to resume diuretics at discharge.    Diabetes mellitus with renal complications (HCC) CBGs well-controlled, 114-142, on moderate scale SSI.    Acute kidney injury/CKD (chronic kidney disease) stage 3, GFR 30-59 ml/min Creatinine improving. Baseline creatinine 1.4-1.6. Current creatinine 1.65. OK to resume Lasix and HCTZ. Creatinine now back to baseline values.    Status post lumbar surgery Per neurosurgery.    Hyperlipidemia  Continue Lipitor.    Obesity, Class III, BMI 40-49.9 (morbid obesity) (HCC) Body mass index is 41 kg/m.    Hypothyroidism TSH slightly low, free T4 elevated indicating that she is over replaced. Synthroid decreased to 88 g daily 02/28/17.   Subjective:   Slowly improving.  Objective:    Vitals:   03/03/17 0144 03/03/17 0602 03/03/17 0848 03/03/17 1128  BP: (!) 179/49 (!) 166/55 (!) 194/54  139/64  Pulse: (!) 52 (!) 59 (!) 56 (!) 55  Resp: 16 18 18 18   Temp: 98 F (36.7 C) 98.2 F (36.8 C) 98.4 F (36.9 C) 99.5 F (37.5 C)  TempSrc: Oral Oral Oral Oral  SpO2: 99% 98% 100% 100%  Weight:      Height:        Intake/Output Summary (Last 24 hours) at 03/03/17 1523 Last data filed at 03/02/17 2100  Gross per 24 hour  Intake              180 ml  Output                0 ml  Net              180 ml   Filed Weights   02/24/17 0628 02/26/17 0725  Weight: 98.4 kg (217 lb) 98.4 kg (217 lb)    Exam:Unchanged from 03/02/17 except as noted General: No distress. Cardiovascular: Heart sounds show a regular rate, and rhythm. No gallops or rubs. 2/6 systolic ejection murmur. No JVD. Lungs: Clear to auscultation bilaterally with fair air movement. No rales, rhonchi or wheezes. Abdomen: Soft, nontender, nondistended with normal active bowel sounds. No masses. No hepatosplenomegaly. Neurological: More awake today. Skin: Warm and dry. No rashes or lesions. Extremities: No clubbing or cyanosis. No edema. Pedal pulses 2+. .  Data Reviewed:   I have personally reviewed following labs and imaging studies:  Labs: Labs show the following:   Basic Metabolic Panel:  Recent Labs Lab 02/25/17 0802 02/26/17 1324 02/27/17 1045 03/01/17 0958 03/02/17 0648 03/03/17 0846  NA 136 135 134* 136 136 137  K  4.2 4.5 4.6 4.1 4.2 4.4  CL 106  --  105 107 110 108  CO2 22  --  22 20* 20* 21*  GLUCOSE 137*  --  138* 173* 114* 163*  BUN 37*  --  53* 59* 55* 51*  CREATININE 1.81*  --  2.30* 1.91* 1.65* 1.53*  CALCIUM 8.9  --  8.1* 7.9* 8.2* 8.3*   GFR Estimated Creatinine Clearance: 35.2 mL/min (A) (by C-G formula based on SCr of 1.53 mg/dL (H)). Liver Function Tests: No results for input(s): AST, ALT, ALKPHOS, BILITOT, PROT, ALBUMIN in the last 168 hours. No results for input(s): LIPASE, AMYLASE in the last 168 hours. No results for input(s): AMMONIA in the last 168 hours. Coagulation  profile  Recent Labs Lab 02/25/17 0802  INR 1.27    CBC:  Recent Labs Lab 02/25/17 0802 02/26/17 1324  WBC 12.5*  --   HGB 8.8* 7.8*  HCT 27.9* 23.0*  MCV 86.6  --   PLT 224  --    CBG:  Recent Labs Lab 03/02/17 1137 03/02/17 1605 03/02/17 2120 03/03/17 0631 03/03/17 1117  GLUCAP 143* 143* 118* 111* 144*    Procedures and diagnostic studies:  No results found.  Medications:   . atorvastatin  40 mg Oral QPM  . carvedilol  25 mg Oral BID  . cloNIDine  0.2 mg Oral BID  . docusate sodium  100 mg Oral BID  . enoxaparin (LOVENOX) injection  40 mg Subcutaneous Q24H  . insulin aspart  0-15 Units Subcutaneous TID WC  . insulin aspart  0-5 Units Subcutaneous QHS  . levothyroxine  88 mcg Oral QAC breakfast  . losartan  100 mg Oral QPC breakfast  . polyethylene glycol  17 g Oral Daily  . senna  1 tablet Oral BID   Continuous Infusions: . lactated ringers 10 mL/hr at 02/26/17 0728  . methocarbamol (ROBAXIN)  IV      Consult level I visit  LOS: 7 days   Windie Marasco  [facility] Pager [phone]. If unable to reach me by pager, please call my cell phone at [phone].  *Please refer to [email or web address], password [email or web address] to get updated schedule on who will round on this patient, as hospitalists switch teams weekly. If 7PM-7AM, please contact night-coverage at [email or web address], password [email or web address] for any overnight needs.  03/03/2017, 3:23 PM

## 2017-03-03 NOTE — Discharge Summary (Signed)
Physician Discharge Summary  [name] MRN: [id number] DOB/AGE: April 22, 1941 76 y.o.  Admit date: 02/24/2017 Discharge date: 03/03/2017  Admission Diagnoses:  Lumbar stenosis  Discharge Diagnoses:  Same Active Problems:   Accelerated hypertension   CKD (chronic kidney disease) stage 3, GFR 30-59 ml/min   Hypothyroidism   Status post lumbar surgery   Obesity, Class III, BMI 40-49.9 (morbid obesity) ([place])   DM (diabetes mellitus), type 2 with renal complications (HCC)   AKI (acute kidney injury) ([place])   Lethargy  Discharged Condition: Stable  Hospital Course:  Taylor Tyler is a 75 y.o. female who was admitted for the below procedure. Preoperatively and intraoperatively was complicated by accelerated hypertension. Post op was complicated by AKI. TH was consulted for medical management. Appreciate recs. At time of discharge, pain was well controlled, ambulating with Pt/OT, tolerating po, voiding normal. Ready for discharge. PT/OT rec SNF. Agree with this decision.  Treatments: Surgery - Planned return to OR for removal of L3-4 and L4-5 Coflex devices, re-operative bilateral L3-4 and L4-5 laminectomies with L4-5 Gill procedure, L5-S1 initial laminectomy with facetectomy, posterior segmental instrumented fusion L3-S1  Discharge Exam: Blood pressure (!) 194/54, pulse (!) 56, temperature 98.4 F (36.9 C), temperature source Oral, resp. rate 18, height 5\' 1"  (1.549 m), weight 98.4 kg (217 lb), SpO2 100 %. Awake, alert, oriented Speech fluent, appropriate CN grossly intact 5/5 BUE/BLE Wound c/d/i  Disposition: SNF  Discharge Instructions    Call MD for:  difficulty breathing, headache or visual disturbances    Complete by:  As directed    Call MD for:  persistant dizziness or light-headedness    Complete by:  As directed    Call MD for:  redness, tenderness, or signs of infection (pain, swelling, redness, odor or green/yellow discharge around incision site)    Complete  by:  As directed    Call MD for:  severe uncontrolled pain    Complete by:  As directed    Call MD for:  temperature >100.4    Complete by:  As directed    Diet general    Complete by:  As directed    Driving Restrictions    Complete by:  As directed    Do not drive until given clearance.   Increase activity slowly    Complete by:  As directed    Lifting restrictions    Complete by:  As directed    Do not lift anything >10lbs. Avoid bending and twisting in awkward positions. Avoid bending at the back.   May shower / Bathe    Complete by:  As directed    In 24 hours. Okay to wash wound with warm soapy water. Avoid scrubbing the wound. Pat dry.   Remove dressing in 24 hours    Complete by:  As directed      Allergies as of 03/03/2017      Reactions   Clams [shellfish Allergy] Swelling, Other (See Comments)   THROAT SWELLS NECK TURNS RED   Hydralazine Other (See Comments)   CHEST TIGHTNESS      Medication List    TAKE these medications   atorvastatin 40 MG tablet Commonly known as:  LIPITOR Take 40 mg by mouth every evening.   carvedilol 25 MG tablet Commonly known as:  COREG Take 25 mg by mouth 2 (two) times daily.   cloNIDine 0.2 MG tablet Commonly known as:  CATAPRES Take 1 tablet (0.2 mg total) by mouth 2 (two) times  daily. What changed:  when to take this   cyclobenzaprine 5 MG tablet Commonly known as:  FLEXERIL Take 1 tablet (5 mg total) by mouth 3 (three) times daily as needed for muscle spasms.   ergocalciferol 50000 units capsule Commonly known as:  VITAMIN D2 Take 50,000 Units by mouth once a week. On Tuesdays   ferrous sulfate 325 (65 FE) MG tablet Take 325 mg by mouth daily with breakfast.   furosemide 20 MG tablet Commonly known as:  LASIX Take 20 mg by mouth daily.   hydrochlorothiazide 25 MG tablet Commonly known as:  HYDRODIURIL Take 25 mg by mouth daily.   levothyroxine 100 MCG tablet Commonly known as:  SYNTHROID, LEVOTHROID Take 100  mcg by mouth daily before breakfast.   losartan 100 MG tablet Commonly known as:  COZAAR Take 100 mg by mouth daily after breakfast.   Magnesium Oxide 250 MG Tabs Take 250 mg by mouth daily.   metFORMIN 500 MG tablet Commonly known as:  GLUCOPHAGE Take 500 mg by mouth 2 (two) times daily.   multivitamin with minerals Tabs tablet Take 1 tablet by mouth daily. [employer] [facility]   naproxen sodium 220 MG tablet Commonly known as:  ANAPROX Take 220 mg by mouth daily as needed (pain).   oxyCODONE-acetaminophen 5-325 MG tablet Commonly known as:  PERCOCET/ROXICET Take 1 tablet by mouth every 6 (six) hours as needed for severe pain.   SOOTHE 0.6-0.6 % Soln Generic drug:  Propylene Glycol-Glycerin Place 1 drop into both eyes daily as needed (for dry/gritty eyes.).      Follow-up Information    Ditty, Kevan Ny, MD. Schedule an appointment as soon as possible for a visit in 3 week(s).   Specialty:  Neurosurgery Contact information: [address] [place] [address] [phone]           Signed: Traci Sermon 03/03/2017, 9:42 AM

## 2017-03-03 NOTE — Care Management Note (Signed)
Case Management Note  Patient Details  Name: Taylor Tyler MRN: [id number] Date of Birth: Jul 15, 1941  Subjective/Objective:                    Action/Plan: Pt discharging to [facility] SNF today. No further needs per CM.   Expected Discharge Date:  03/03/17               Expected Discharge Plan:  Skilled Nursing Facility  In-House Referral:  Clinical Social Work  Discharge planning Services     Post Acute Care Choice:    Choice offered to:     DME Arranged:    DME Agency:     HH Arranged:    [place] Agency:     Status of Service:  Completed, signed off  If discussed at [employer], dates discussed:    Additional Comments:  Pollie Friar, RN 03/03/2017, 11:27 AM

## 2017-03-03 NOTE — Progress Notes (Signed)
Occupational Therapy Treatment Patient Details Name: Taylor Tyler MRN: [id number] DOB: [birth year] Today's Date: 03/03/2017    History of present illness Pt is a 76 y/o female s/p 2 part back surgery: Stage 1: L5-S1 Anterior lumbar interbody fusion; L3-5 Anterior lateral lumbar interbody fusion with removal of coflex Stage 2:  L5-S1 Laminectomy with facetectomies for decompression, L4-5 Laminectomy with Gill procedure, L3-S1 Dorsal internal fixation with Mazor robot. Pt has a past medical history of Anemia; Arthritis; Chronic kidney disease; Diabetes mellitus; Disease of pancreas; DJD; Hypertension; previous back surgeries and Hypothyroidism.   OT comments  Pt demonstrating improvement toward OT goals this session. She was able to complete stand-pivot toilet transfers with max assist +2 this session to and from [facility]. She continues to demonstrate slow processing and difficulty sequencing but demonstrated improved short-term memory. Continue to recommend short-term SNF post-acute D/C. Will continue to follow acutely.    Follow Up Recommendations  SNF;Supervision/Assistance - 24 hour    Equipment Recommendations  Other (comment) (defer to next venue)    Recommendations for Other Services      Precautions / Restrictions Precautions Precautions: Fall;Back Precaution Booklet Issued: Yes (comment) Precaution Comments: therapists reviewed 3/3 back precautions with pt throughout session Required Braces or Orthoses: Spinal Brace Spinal Brace: Lumbar corset;Applied in sitting position Restrictions Weight Bearing Restrictions: No       Mobility Bed Mobility Overal bed mobility: Needs Assistance Bed Mobility: Rolling;Sidelying to Sit;Sit to Sidelying Rolling: Mod assist Sidelying to sit: Max assist;+2 for physical assistance     Sit to sidelying: Max assist;+2 for physical assistance General bed mobility comments: Significant reliance on physical assist, cues and side  rails  Transfers Overall transfer level: Needs assistance Equipment used: 2 person hand held assist (face to face with gait belt and chuck pad) Transfers: Sit to/from Stand Sit to Stand: Max assist;+2 physical assistance Stand pivot transfers: Mod assist;+2 physical assistance       General transfer comment: Assist to power up and take pivotal steps to BSC, decreased activity tolerance and overall strength, required facilitation of weight shift    Balance Overall balance assessment: Needs assistance Sitting-balance support: Feet supported Sitting balance-Leahy Scale: Poor Sitting balance - Comments: Required hands on assist for upright posture initially Postural control: Posterior lean Standing balance support: Bilateral upper extremity supported Standing balance-Leahy Scale: Poor Standing balance comment: max A x2                           ADL either performed or assessed with clinical judgement   ADL Overall ADL's : Needs assistance/impaired                         Toilet Transfer: Maximal assistance;+2 for physical assistance;+2 for safety/equipment Toilet Transfer Details (indicate cue type and reason): Able to complete stand-pivot to [facility] taking a few pivotal steps this session.  Toileting- Clothing Manipulation and Hygiene: Total assistance;Sit to/from stand Toileting - Clothing Manipulation Details (indicate cue type and reason): Able to stand with max assist but total assist to complete toileting hygiene.      Functional mobility during ADLs: Maximal assistance;+2 for physical assistance;+2 for safety/equipment (a few steps only) General ADL Comments: Able to complete stand-pivot toilet transfer to [facility] this session to participate in toileting tasks.      Vision       Perception     Praxis      Cognition Arousal/Alertness: Awake/alert  Behavior During Therapy: Flat affect Overall Cognitive Status: Impaired/Different from baseline Area of  Impairment: Memory;Problem solving;Awareness;Following commands                   Current Attention Level: Sustained Memory: Decreased short-term memory Following Commands: Follows one step commands with increased time;Follows one step commands consistently Safety/Judgement: Decreased awareness of safety;Decreased awareness of deficits Awareness: Emergent Problem Solving: Slow processing;Decreased initiation;Difficulty sequencing General Comments: Decreased initiation and difficulty sequencing at times this session.         Exercises Exercises:  (Functional task performance, sitting balance on BSC)   Shoulder Instructions       General Comments      Pertinent Vitals/ Pain       Pain Assessment: Faces Faces Pain Scale: Hurts little more Pain Location: back Pain Descriptors / Indicators: Sore Pain Intervention(s): Monitored during session;Repositioned  Home Living                                          Prior Functioning/Environment              Frequency  Min 2X/week        Progress Toward Goals  OT Goals(current goals can now be found in the care plan section)  Progress towards OT goals: Progressing toward goals  Acute Rehab OT Goals Patient Stated Goal: to get stronger OT Goal Formulation: With patient Time For Goal Achievement: 03/13/17 Potential to Achieve Goals: Good  Plan Discharge plan remains appropriate    Co-evaluation    PT/OT/SLP Co-Evaluation/Treatment: Yes Reason for Co-Treatment: For patient/therapist safety PT goals addressed during session: Mobility/safety with mobility OT goals addressed during session: ADL's and self-care      AM-PAC PT "6 Clicks" Daily Activity     Outcome Measure   Help from another person eating meals?: A Little Help from another person taking care of personal grooming?: A Lot Help from another person toileting, which includes using toliet, bedpan, or urinal?: A Lot Help from  another person bathing (including washing, rinsing, drying)?: A Lot Help from another person to put on and taking off regular upper body clothing?: A Lot Help from another person to put on and taking off regular lower body clothing?: Total 6 Click Score: 12    End of Session Equipment Utilized During Treatment: Gait belt;Rolling walker;Back brace  OT Visit Diagnosis: Unsteadiness on feet (R26.81);Other abnormalities of gait and mobility (R26.89);Muscle weakness (generalized) (M62.81);Pain Pain - Right/Left: Right Pain - part of body: Leg (back)   Activity Tolerance Patient tolerated treatment well   Patient Left with call bell/phone within reach;in chair   Nurse Communication          Time: 5883-2549 OT Time Calculation (min): 24 min  Charges: OT General Charges $OT Visit: 1 Procedure OT Treatments $Self Care/Home Management : 8-22 mins  Norman Herrlich, MS OTR/L  Pager: [place] 03/03/2017, 10:50 AM

## 2017-03-03 NOTE — Progress Notes (Signed)
Pt discharging at this time via [facility] transport to [facility]. Surgical dressing sites changed dry and intact. IV removed to right hand, dry dressing applied. Pt denies pain or discomfort. Report called in nurse, Monette at facility. No noted distress.

## 2017-03-03 NOTE — Progress Notes (Signed)
Physical Therapy Treatment Patient Details Name: Taylor Tyler MRN: [id number] DOB: [birth year] Today's Date: 03/03/2017    History of Present Illness Pt is a 76 y/o female s/p 2 part back surgery: Stage 1: L5-S1 Anterior lumbar interbody fusion; L3-5 Anterior lateral lumbar interbody fusion with removal of coflex Stage 2:  L5-S1 Laminectomy with facetectomies for decompression, L4-5 Laminectomy with Gill procedure, L3-S1 Dorsal internal fixation with Mazor robot. Pt has a past medical history of Anemia; Arthritis; Chronic kidney disease; Diabetes mellitus; Disease of pancreas; DJD; Hypertension; previous back surgeries and Hypothyroidism.    PT Comments    Patient with some improvements in activity tolerance and mobility today. Was able to initiate some steps with +2 max assist. Increased time and effort to perform. Current POC remains appropriate.   Follow Up Recommendations  SNF;Supervision/Assistance - 24 hour     Equipment Recommendations  None recommended by PT    Recommendations for Other Services       Precautions / Restrictions Precautions Precautions: Fall;Back Precaution Booklet Issued: Yes (comment) Precaution Comments: therapists reviewed 3/3 back precautions with pt throughout session Required Braces or Orthoses: Spinal Brace Spinal Brace: Lumbar corset;Applied in sitting position Restrictions Weight Bearing Restrictions: No    Mobility  Bed Mobility Overal bed mobility: Needs Assistance Bed Mobility: Rolling;Sidelying to Sit;Sit to Sidelying Rolling: Mod assist Sidelying to sit: Max assist;+2 for physical assistance     Sit to sidelying: Max assist;+2 for physical assistance General bed mobility comments: significant reliance on physical assist, cues and side rails  Transfers Overall transfer level: Needs assistance Equipment used: 2 person hand held assist (face to face with gait belt and chuck pad) Transfers: Sit to/from Stand Sit to Stand: Max assist;+2  physical assistance         General transfer comment: Assist to power up and take pivotal steps to BSC, decreased activity tolerance and overall strength, required facilitation of weight shift  Ambulation/Gait Ambulation/Gait assistance: Max assist;+2 physical assistance Ambulation Distance (Feet): 8 Feet Assistive device: Rolling walker (2 wheeled) Gait Pattern/deviations: Step-to pattern Gait velocity: decreased Gait velocity interpretation: <1.8 ft/sec, indicative of risk for recurrent falls General Gait Details: patient required increased physical assist to maintain upright despite UE support, weight shifts to initiate steps   Stairs            Wheelchair Mobility    Modified Rankin (Stroke Patients Only)       Balance Overall balance assessment: Needs assistance Sitting-balance support: Feet supported Sitting balance-Leahy Scale: Poor Sitting balance - Comments: required hands on assist for upright posture initially Postural control: Posterior lean Standing balance support: Bilateral upper extremity supported Standing balance-Leahy Scale: Poor Standing balance comment: max A x2                            Cognition Arousal/Alertness: Awake/alert Behavior During Therapy: Flat affect Overall Cognitive Status: Impaired/Different from baseline Area of Impairment: Attention;Memory;Following commands;Safety/judgement;Problem solving                   Current Attention Level: Sustained Memory: Decreased short-term memory;Decreased recall of precautions Following Commands: Follows one step commands with increased time Safety/Judgement: Decreased awareness of safety;Decreased awareness of deficits Awareness: Emergent Problem Solving: Slow processing;Decreased initiation;Difficulty sequencing;Requires verbal cues;Requires tactile cues General Comments: Decreased initiation and difficulty sequencing at times this session.       Exercises       General Comments  Pertinent Vitals/Pain Pain Assessment: Faces Faces Pain Scale: Hurts little more Pain Location: back Pain Descriptors / Indicators: Sore Pain Intervention(s): Monitored during session;Repositioned    Home Living                      Prior Function            PT Goals (current goals can now be found in the care plan section) Acute Rehab PT Goals Patient Stated Goal: to get stronger PT Goal Formulation: With patient Time For Goal Achievement: 03/10/17 Potential to Achieve Goals: Fair Progress towards PT goals: Progressing toward goals (modest)    Frequency    Min 5X/week      PT Plan Current plan remains appropriate    Co-evaluation PT/OT/SLP Co-Evaluation/Treatment: Yes Reason for Co-Treatment: For patient/therapist safety PT goals addressed during session: Mobility/safety with mobility OT goals addressed during session: ADL's and self-care      AM-PAC PT "6 Clicks" Daily Activity  Outcome Measure  Difficulty turning over in bed (including adjusting bedclothes, sheets and blankets)?: Total Difficulty moving from lying on back to sitting on the side of the bed? : Total Difficulty sitting down on and standing up from a chair with arms (e.g., wheelchair, bedside commode, etc,.)?: Total Help needed moving to and from a bed to chair (including a wheelchair)?: Total Help needed walking in hospital room?: Total Help needed climbing 3-5 steps with a railing? : Total 6 Click Score: 6    End of Session Equipment Utilized During Treatment: Other (comment) Charlaine Dalton) Activity Tolerance: Patient limited by lethargy Patient left: in chair;with call bell/phone within reach;with family/visitor present Nurse Communication: Mobility status;Precautions PT Visit Diagnosis: Unsteadiness on feet (R26.81);Muscle weakness (generalized) (M62.81);Pain Pain - part of body:  (back)     Time: 8756-4332 PT Time Calculation (min) (ACUTE ONLY): 24  min  Charges:  $Therapeutic Activity: 8-22 mins                    G Codes:       Alben Deeds, PT DPT  Board Certified Neurologic Specialist [facility] 03/03/2017, 10:19 AM

## 2017-03-03 NOTE — Progress Notes (Signed)
Pt seen and examined.  No issues overnight.  EXAM: Temp:  [98 F (36.7 C)-100.3 F (37.9 C)] 98.4 F (36.9 C) (07/31 0848) Pulse Rate:  [52-59] 56 (07/31 0848) Resp:  [16-18] 18 (07/31 0848) BP: (166-194)/(46-69) 194/54 (07/31 0848) SpO2:  [97 %-100 %] 100 % (07/31 0848) Intake/Output      07/30 0701 - 07/31 0700 07/31 0701 - 08/01 0700   P.O. 180    Total Intake(mL/kg) 180 (1.8)    Net +180          Urine Occurrence 2 x    Stool Occurrence 1 x     Awake and alert Follows commands throughout Full strength BLE Incisions c/d/i  Plan Doing well from NS standpoint. Appreciate TH assistance with medical recs. Patient is cleared for discharge from NS perspective. Will sign orders for discharge if medicine approves and bed available.

## 2017-03-03 NOTE — Progress Notes (Signed)
Discharge to: [facility] Anticipated discharge date: 03/03/17 Family notified: Yes, by phone Transportation by: [facility]  Report #: [phone]  [place] signing off.  [name] LCSW [phone]

## 2017-03-04 ENCOUNTER — Non-Acute Institutional Stay (SKILLED_NURSING_FACILITY): Payer: Medicare Other | Admitting: Adult Health

## 2017-03-04 ENCOUNTER — Encounter: Payer: Self-pay | Admitting: Adult Health

## 2017-03-04 DIAGNOSIS — R600 Localized edema: Secondary | ICD-10-CM

## 2017-03-04 DIAGNOSIS — E1122 Type 2 diabetes mellitus with diabetic chronic kidney disease: Secondary | ICD-10-CM

## 2017-03-04 DIAGNOSIS — Z9889 Other specified postprocedural states: Secondary | ICD-10-CM | POA: Diagnosis not present

## 2017-03-04 DIAGNOSIS — N183 Chronic kidney disease, stage 3 unspecified: Secondary | ICD-10-CM

## 2017-03-04 DIAGNOSIS — M48061 Spinal stenosis, lumbar region without neurogenic claudication: Secondary | ICD-10-CM | POA: Diagnosis not present

## 2017-03-04 DIAGNOSIS — I10 Essential (primary) hypertension: Secondary | ICD-10-CM | POA: Diagnosis not present

## 2017-03-04 HISTORY — DX: Localized edema: R60.0

## 2017-03-04 NOTE — Progress Notes (Signed)
Location:   [place] Room Number: 601 Place of Service:  SNF ([id number])   CODE STATUS: Full code  Allergies  Allergen Reactions  . Clams [Shellfish Allergy] Swelling and Other (See Comments)    THROAT SWELLS NECK TURNS RED  . Hydralazine Other (See Comments)    CHEST TIGHTNESS    Chief Complaint  Patient presents with  . Hospitalization Follow-up    Hospital follow up    HPI:  She had been admitted to the hospitalized for a two stage elective lumbar surgery. She had complications including accelerated hypertension; acute on chronic stage III renal disease. She did require a short stay in ICU. Staff reports that last night she was paranoid and wanting to go home. She is better this morning; but is somewhat withdrawn. At this time her goal is to return home upon completion of her rehab. She is denying any pain at this time.    Past Medical History:  Diagnosis Date  . Anemia   . Arthritis   . Chronic kidney disease    ?? renal insufficiency,   . Diabetes mellitus without complication ([place])    diagnosed 4-5 yrs ago  . Disease of pancreas   . DJD (degenerative joint disease)   . Hypertension   . Hypothyroidism     Past Surgical History:  Procedure Laterality Date  . ABDOMINAL EXPOSURE N/A 02/24/2017   Procedure: ABDOMINAL EXPOSURE;  Surgeon: Angelia Mould, MD;  Location: [place];  Service: Vascular;  Laterality: N/A;  . ABDOMINAL HYSTERECTOMY  1987  . ANTERIOR LAT LUMBAR FUSION N/A 02/24/2017   Procedure: Lumbar three- five Anterior lateral lumbar interbody fusion;  Surgeon: Ditty, Kevan Ny, MD;  Location: [place];  Service: Neurosurgery;  Laterality: N/A;  L3-5 Anterior lateral lumbar interbody fusion with removal of coflex at L3-4, L4-5  . ANTERIOR LUMBAR FUSION N/A 02/24/2017   Procedure: Stage 1: Lumbar five-Sacral one Anterior lumbar interbody fusion;  Surgeon: Ditty, Kevan Ny, MD;  Location: [place];  Service: Neurosurgery;  Laterality: N/A;   Stage 1: L5-S1 Anterior lumbar interbody fusion  . APPLICATION OF ROBOTIC ASSISTANCE FOR SPINAL PROCEDURE N/A 02/26/2017   Procedure: APPLICATION OF ROBOTIC ASSISTANCE FOR SPINAL PROCEDURE;  Surgeon: Ditty, Kevan Ny, MD;  Location: [place];  Service: Neurosurgery;  Laterality: N/A;  . BACK SURGERY  2013  . BREAST SURGERY  1992   Breast Reduction  . CHOLECYSTECTOMY  1982  . COLON SURGERY  2015  . COLON SURGERY    . EYE SURGERY Right 2017   cataracts  . [place]  . LUMBAR FUSION  02/24/2017  . LUMBAR LAMINECTOMY WITH SPINOUS PROCESS PLATE 2 LEVEL N/A 0/93/2355   Procedure: LUMBAR LAMINECTOMY/DECOMPRESSION MICRODISCECTOMY CoFlex;  Surgeon: Faythe Ghee, MD;  Location: [facility];  Service: Neurosurgery;  Laterality: N/A;  Lumbar three-four,Lumbar Four-Five Laminectomy with Coflex    Social History   Social History  . Marital status: Widowed    Spouse name: N/A  . Number of children: N/A  . Years of education: N/A   Occupational History  . Not on file.   Social History Main Topics  . Smoking status: Never Smoker  . Smokeless tobacco: Never Used  . Alcohol use No  . Drug use: No  . Sexual activity: Not on file   Other Topics Concern  . Not on file   Social History Narrative  . No narrative on file   Family History  Problem Relation Age of Onset  .  Diabetes Father   . Heart attack Father   . Hypertension Father   . Diabetes Mother   . Hypertension Mother       VITAL SIGNS BP 130/78   Pulse 72   Temp (!) 97.2 F (36.2 C)   Resp 18   SpO2 95%   Patient's Medications  New Prescriptions   No medications on file  Previous Medications   ATORVASTATIN (LIPITOR) 40 MG TABLET    Take 40 mg by mouth every evening.   CARVEDILOL (COREG) 25 MG TABLET    Take 25 mg by mouth 2 (two) times daily.    CLONIDINE (CATAPRES) 0.2 MG TABLET    Take 0.2 mg by mouth 2 (two) times daily.   CYCLOBENZAPRINE (FLEXERIL) 5 MG TABLET    Take 1 tablet (5 mg total)  by mouth 3 (three) times daily as needed for muscle spasms.   ERGOCALCIFEROL (VITAMIN D2) 50000 UNITS CAPSULE    Take 50,000 Units by mouth once a week. On Tuesdays   FERROUS SULFATE 325 (65 FE) MG TABLET    Take 325 mg by mouth daily with breakfast.   FUROSEMIDE (LASIX) 20 MG TABLET    Take 20 mg by mouth daily.    HYDROCHLOROTHIAZIDE (HYDRODIURIL) 25 MG TABLET    Take 25 mg by mouth daily.    LEVOTHYROXINE (SYNTHROID, LEVOTHROID) 100 MCG TABLET    Take 100 mcg by mouth daily before breakfast.    LOSARTAN (COZAAR) 100 MG TABLET    Take 100 mg by mouth daily after breakfast.    MAGNESIUM OXIDE 250 MG TABS    Take 250 mg by mouth daily.   METFORMIN (GLUCOPHAGE) 500 MG TABLET    Take 500 mg by mouth 2 (two) times daily.    MULTIPLE VITAMIN (MULTIVITAMIN WITH MINERALS) TABS TABLET    Take 1 tablet by mouth daily. [employer] [facility]   NAPROXEN SODIUM (ANAPROX) 220 MG TABLET    Take 220 mg by mouth daily as needed (pain).    OXYCODONE-ACETAMINOPHEN (PERCOCET/ROXICET) 5-325 MG TABLET    Take 1 tablet by mouth every 6 (six) hours as needed for severe pain.   PROPYLENE GLYCOL-GLYCERIN (SOOTHE) 0.6-0.6 % SOLN    Place 1 drop into both eyes daily as needed (for dry/gritty eyes.).   Modified Medications   No medications on file  Discontinued Medications   CLONIDINE (CATAPRES) 0.2 MG TABLET    Take 1 tablet (0.2 mg total) by mouth 2 (two) times daily.     SIGNIFICANT DIAGNOSTIC EXAMS  REIVEWED: TODAY  02-24-17: ct of lumbar spine: IMPRESSION: Status post L5-S1 PLIF. Status post L3-4 and L4-5 XLIF. No adverse features. Coflex devices remain positioned between fragmented spinous processes.  02-26-17: chest x-ray: 1. Right IJ line noted with tip projected over superior vena cava. 2. Cardiomegaly with mild pulmonary venous congestion and mild bilateral interstitial prominence. Mild CHF cannot be excluded.  LABS REVIEWED: TODAY:   02-16-17; wbc 5.1; hgb 10.2; ht 31.7; mcv 89.0; plt 227; glucose 113; bun 36;  creat 1.65; k+ 4.6; na++ 139; ca 9.4; hgb s1c 5.7 7-24-8: tsh 0.140 02-28-17: free T 4: 1.47 03-03-17: glucose 51; creat 1.53; k+ 4.4; na++ 137; ca 8.3    Review of Systems  Constitutional: Negative for malaise/fatigue.  Respiratory: Negative for cough and shortness of breath.   Cardiovascular: Negative for chest pain, palpitations and leg swelling.  Gastrointestinal: Negative for abdominal pain, constipation and heartburn.  Musculoskeletal: Negative for back pain, joint pain and myalgias.  Her pain is being managed at this time   Skin: Negative.   Neurological: Negative for dizziness.  Psychiatric/Behavioral: The patient is not nervous/anxious.     Physical Exam  Constitutional: She is oriented to person, place, and time. She appears well-developed and well-nourished. No distress.  Obese   Eyes: Conjunctivae are normal.  Neck: Neck supple. No JVD present. No thyromegaly present.  Cardiovascular: Normal rate, regular rhythm and intact distal pulses.   Respiratory: Effort normal and breath sounds normal. No respiratory distress. She has no wheezes.  GI: Soft. Bowel sounds are normal. She exhibits no distension. There is no tenderness.  Musculoskeletal: She exhibits edema.  Able to move all extremities  Trace lower extremity edema   Lymphadenopathy:    She has no cervical adenopathy.  Neurological: She is alert and oriented to person, place, and time.  Skin: Skin is warm and dry. She is not diaphoretic.  No signs of infection to incision line   Psychiatric: She has a normal mood and affect.    ASSESSMENT/ PLAN:  REVIEWED TODAY:   1. Accelerated hypertension: b/p 130/78: is stable: will continue coreg 25 mg twice daily clonidine 0.2 mg twice daily hctz 25 mg daily cozaar 100 mg daily will not make changes will monitor  2. Dyslipidemia: stable will continue lipitor 40 mg daily   3. Anemia: hgb 10.2; is stable will continue iron daily   4.  Lower extremity edema: is  stable will continue lasix 20 mg daily will get her TED hose  5. Hypothyroidism: tsh 0.040: her medication was lowered on 02-28-17  To synthroid 100 mcg daily will monitor  6. Vit D deficiency: will continue vit d 50,000 units weekly    7. Diabetes: hgb a1c 5.7 is stable will continue metformin 500 mg twice daily and will check cbg daily   8. Lumbar stenosis: is status post lumbar fusion: no change in status: will continue therapy as directed and will continue to follow up with neuro. Has percocet 5/325 mg every 6 hours as needed has flexeril 5 mg three times daily as needed  9. ckd stage III: stable bun 51; creat 1.53   MD is aware of resident's narcotic use and is in agreement with current plan of care. We will attempt to wean resident as apropriate   Ok Edwards NP [facility]  Contact [phone] Monday through Friday 8am- 5pm  After hours call [phone]

## 2017-03-09 LAB — BASIC METABOLIC PANEL
BUN: 45 — AB (ref 4–21)
CREATININE: 1.9 — AB (ref 0.5–1.1)
Glucose: 112
POTASSIUM: 4.5 (ref 3.4–5.3)
SODIUM: 135 — AB (ref 137–147)

## 2017-03-09 LAB — CBC AND DIFFERENTIAL
HCT: 24 — AB (ref 36–46)
HEMOGLOBIN: 8 — AB (ref 12.0–16.0)
Platelets: 320 (ref 150–399)
WBC: 12.8

## 2017-03-10 ENCOUNTER — Encounter: Payer: Self-pay | Admitting: Internal Medicine

## 2017-03-10 ENCOUNTER — Non-Acute Institutional Stay (SKILLED_NURSING_FACILITY): Payer: Medicare Other | Admitting: Internal Medicine

## 2017-03-10 DIAGNOSIS — N183 Chronic kidney disease, stage 3 unspecified: Secondary | ICD-10-CM

## 2017-03-10 DIAGNOSIS — M48061 Spinal stenosis, lumbar region without neurogenic claudication: Secondary | ICD-10-CM | POA: Diagnosis not present

## 2017-03-10 DIAGNOSIS — D62 Acute posthemorrhagic anemia: Secondary | ICD-10-CM

## 2017-03-10 DIAGNOSIS — I10 Essential (primary) hypertension: Secondary | ICD-10-CM | POA: Diagnosis not present

## 2017-03-10 DIAGNOSIS — R5383 Other fatigue: Secondary | ICD-10-CM

## 2017-03-10 DIAGNOSIS — E7849 Other hyperlipidemia: Secondary | ICD-10-CM

## 2017-03-10 DIAGNOSIS — E784 Other hyperlipidemia: Secondary | ICD-10-CM | POA: Diagnosis not present

## 2017-03-10 DIAGNOSIS — Z9889 Other specified postprocedural states: Secondary | ICD-10-CM | POA: Diagnosis not present

## 2017-03-10 DIAGNOSIS — N179 Acute kidney failure, unspecified: Secondary | ICD-10-CM

## 2017-03-10 DIAGNOSIS — E034 Atrophy of thyroid (acquired): Secondary | ICD-10-CM | POA: Diagnosis not present

## 2017-03-10 DIAGNOSIS — E1122 Type 2 diabetes mellitus with diabetic chronic kidney disease: Secondary | ICD-10-CM

## 2017-03-10 NOTE — Progress Notes (Signed)
: Provider:  Noah Delaine. Sheppard Coil, MD Location:  [place] Room Number: 259 Place of Service:  SNF ([phone])  PCP: Lenard Simmer, MD Patient Care Team: Lenard Simmer, MD as PCP - General (Radiology) Ditty, Kevan Ny, MD as Consulting Physician (Neurosurgery)  Extended Emergency Contact Information Primary Emergency Contact: [place] Phone: [phone] Relation: Son Secondary Emergency Contact: Joelyn Oms Address: [id number]          [place], [place] [address] [place] Phone: [phone] Relation: Son     Allergies: Clams [shellfish allergy] and Hydralazine  Chief Complaint  Patient presents with  . New Admit To SNF    following hospitalization 02/24/17 to 03/03/17 lumbar stenosis    HPI: Patient is 76 y.o. female with hypertension, diabetes 2, chronic kidney disease stage III, arthritis, and hypothyroidism who was admitted to the neurosurgery service for stage II elective lumbar surgery. Patient was admitted to [facility] from 7/24-31 where she underwent surgery but her preop course was, located by labile blood pressure with blood pressures peaking at 207/59. Try hospitalist will call to manage the patient's medical problems. Home BP meds were resumed and when necessary IV labetalol was ordered for systolic blood pressure greater than 160. Surgery went well but posto operatively patient developed acute kidney injury on chronic kidney disease stage III. Creatinine rose to 2.3 which was treated with IV fluids and holding of Cozaar Lasix and hydrochlorothiazide. In addition patient was more lethargic than expected postop and oxycodone was decreased as well as IV Dilaudid was DC'd. Patient's diabetes continue to be well controlled with moderate sliding scale insulin. Patient is now stable to be admitted to skilled nursing facility for OT/PT. While at skilled nursing facility  patient will be followed for hyperlipidemia treated with Lipitor, anemia treated with iron and diabetes treated with metformin, losartan and statin.  Past Medical History:  Diagnosis Date  . Anemia   . Arthritis   . Chronic kidney disease    ?? renal insufficiency,   . Diabetes mellitus without complication ([place])    diagnosed 4-5 yrs ago  . Disease of pancreas   . DJD (degenerative joint disease)   . Hypertension   . Hypothyroidism     Past Surgical History:  Procedure Laterality Date  . ABDOMINAL EXPOSURE N/A 02/24/2017   Procedure: ABDOMINAL EXPOSURE;  Surgeon: Angelia Mould, MD;  Location: [place];  Service: Vascular;  Laterality: N/A;  . ABDOMINAL HYSTERECTOMY  1987  . ANTERIOR LAT LUMBAR FUSION N/A 02/24/2017   Procedure: Lumbar three- five Anterior lateral lumbar interbody fusion;  Surgeon: Ditty, Kevan Ny, MD;  Location: [place];  Service: Neurosurgery;  Laterality: N/A;  L3-5 Anterior lateral lumbar interbody fusion with removal of coflex at L3-4, L4-5  . ANTERIOR LUMBAR FUSION N/A 02/24/2017   Procedure: Stage 1: Lumbar five-Sacral one Anterior lumbar interbody fusion;  Surgeon: Ditty, Kevan Ny, MD;  Location: [place];  Service: Neurosurgery;  Laterality: N/A;  Stage 1: L5-S1 Anterior lumbar interbody fusion  . APPLICATION OF ROBOTIC ASSISTANCE FOR SPINAL PROCEDURE N/A 02/26/2017   Procedure: APPLICATION OF ROBOTIC ASSISTANCE FOR SPINAL PROCEDURE;  Surgeon: Ditty, Kevan Ny, MD;  Location: [place];  Service: Neurosurgery;  Laterality: N/A;  . BACK SURGERY  2013  . BREAST SURGERY  1992   Breast Reduction  . CHOLECYSTECTOMY  1982  . COLON SURGERY  2015  . COLON SURGERY    .  EYE SURGERY Right 2017   cataracts  . [place]  . LUMBAR FUSION  02/24/2017  . LUMBAR LAMINECTOMY WITH SPINOUS PROCESS PLATE 2 LEVEL N/A 8/36/6294   Procedure: LUMBAR LAMINECTOMY/DECOMPRESSION MICRODISCECTOMY CoFlex;  Surgeon: Faythe Ghee, MD;  Location: [facility];   Service: Neurosurgery;  Laterality: N/A;  Lumbar three-four,Lumbar Four-Five Laminectomy with Coflex    Allergies as of 03/10/2017      Reactions   Clams [shellfish Allergy] Swelling, Other (See Comments)   THROAT SWELLS NECK TURNS RED   Hydralazine Other (See Comments)   CHEST TIGHTNESS      Medication List       Accurate as of 03/10/17  4:31 PM. Always use your most recent med list.          atorvastatin 40 MG tablet Commonly known as:  LIPITOR Take 40 mg by mouth every evening.   carvedilol 25 MG tablet Commonly known as:  COREG Take 25 mg by mouth 2 (two) times daily.   cloNIDine 0.2 MG tablet Commonly known as:  CATAPRES Take 0.2 mg by mouth 2 (two) times daily.   cyclobenzaprine 5 MG tablet Commonly known as:  FLEXERIL Take 1 tablet (5 mg total) by mouth 3 (three) times daily as needed for muscle spasms.   ergocalciferol 50000 units capsule Commonly known as:  VITAMIN D2 Take 50,000 Units by mouth once a week. On Tuesdays   ferrous sulfate 325 (65 FE) MG tablet Take 325 mg by mouth daily with breakfast.   furosemide 20 MG tablet Commonly known as:  LASIX Take 20 mg by mouth daily.   hydrochlorothiazide 25 MG tablet Commonly known as:  HYDRODIURIL Take 25 mg by mouth daily.   levothyroxine 100 MCG tablet Commonly known as:  SYNTHROID, LEVOTHROID Take 100 mcg by mouth daily before breakfast.   losartan 100 MG tablet Commonly known as:  COZAAR Take 100 mg by mouth daily after breakfast.   Magnesium Oxide 250 MG Tabs Take 250 mg by mouth daily.   metFORMIN 500 MG tablet Commonly known as:  GLUCOPHAGE Take 500 mg by mouth 2 (two) times daily.   multivitamin with minerals Tabs tablet Take 1 tablet by mouth daily. [employer] [facility]   naproxen sodium 220 MG tablet Commonly known as:  ANAPROX Take 220 mg by mouth daily as needed (pain).   oxyCODONE-acetaminophen 5-325 MG tablet Commonly known as:  PERCOCET/ROXICET Take 1 tablet by mouth every 6 (six)  hours as needed for severe pain.   SOOTHE 0.6-0.6 % Soln Generic drug:  Propylene Glycol-Glycerin Place 1 drop into both eyes daily as needed (for dry/gritty eyes.).       No orders of the defined types were placed in this encounter.   Immunization History  Administered Date(s) Administered  . Influenza-Unspecified 05/26/2012, 04/20/2013    Social History  Substance Use Topics  . Smoking status: Never Smoker  . Smokeless tobacco: Never Used  . Alcohol use No    Family history is   Family History  Problem Relation Age of Onset  . Diabetes Father   . Heart attack Father   . Hypertension Father   . Diabetes Mother   . Hypertension Mother       Review of Systems  DATA OBTAINED: from patientAnd family member GENERAL:  no fevers, fatigue, appetite changes SKIN: No itching, or rash; Incision site without redness or unexpected heat or unexpected swelling EYES: No eye pain, redness, discharge EARS: No earache, tinnitus, change in hearing  NOSE: No congestion, drainage or bleeding  MOUTH/THROAT: No mouth or tooth pain, No sore throat RESPIRATORY: No cough, wheezing, SOB CARDIAC: No chest pain, palpitations, lower extremity edema  GI: No abdominal pain, No N/V/D or constipation, No heartburn or reflux  GU: No dysuria, frequency or urgency, or incontinence  MUSCULOSKELETAL: + unrelieved bone/joint pain NEUROLOGIC: No headache, dizziness or focal weakness PSYCHIATRIC: No c/o anxiety or sadness   Vitals:   03/10/17 1623  BP: (!) 160/59  Pulse: 60  Resp: (!) 22  Temp: 98.7 F (37.1 C)    SpO2 Readings from Last 1 Encounters:  03/10/17 94%   Body mass index is 41 kg/m.     Physical Exam  GENERAL APPEARANCE: Alert, conversant,  No acute distress.  SKIN: No diaphoresis rash HEAD: Normocephalic, atraumatic  EYES: Conjunctiva/lids clear. Pupils round, reactive. EOMs intact.  EARS: External exam WNL, canals clear. Hearing grossly normal.  NOSE: No deformity or  discharge.  MOUTH/THROAT: Lips w/o lesions  RESPIRATORY: Breathing is even, unlabored. Lung sounds are clear   CARDIOVASCULAR: Heart RRR no murmurs, rubs or gallops. Trace peripheral edema.   GASTROINTESTINAL: Abdomen is soft, non-tender, not distended w/ normal bowel sounds. GENITOURINARY: Bladder non tender, not distended  MUSCULOSKELETAL: No abnormal joints or musculature NEUROLOGIC:  Cranial nerves 2-12 grossly intact. Moves all extremities  PSYCHIATRIC: Mood and affect appropriate to situation, no behavioral issues  Patient Active Problem List   Diagnosis Date Noted  . Bilateral lower extremity edema 03/04/2017  . AKI (acute kidney injury) ([place]) 02/28/2017  . Lethargy 02/28/2017  . Obesity, Class III, BMI 40-49.9 (morbid obesity) ([place]) 02/27/2017  . DM (diabetes mellitus), type 2 with renal complications ([place]) 00/93/8182  . Status post lumbar surgery 02/24/2017  . Hypertensive emergency 01/15/2017  . CKD (chronic kidney disease) stage 3, GFR 30-59 ml/min 01/15/2017  . Hypothyroidism 01/15/2017  . Diverticulosis 01/15/2017  . HLD (hyperlipidemia) 01/15/2017  . Spinal stenosis of lumbar region 01/15/2017  . Low back pain 09/12/2016  . Chest pain 09/05/2016  . Accelerated hypertension 07/06/2016  . Disease of pancreas 07/21/2012      Labs reviewed: Basic Metabolic Panel:    Component Value Date/Time   NA 135 (A) 03/09/2017   NA 140 04/22/2014 0210   K 4.5 03/09/2017   K 4.0 04/22/2014 0210   CL 108 03/03/2017 0846   CL 108 (H) 04/22/2014 0210   CO2 21 (L) 03/03/2017 0846   CO2 24 04/22/2014 0210   GLUCOSE 163 (H) 03/03/2017 0846   GLUCOSE 78 04/22/2014 0210   BUN 45 (A) 03/09/2017   BUN 27 (H) 04/22/2014 0210   CREATININE 1.9 (A) 03/09/2017   CREATININE 1.53 (H) 03/03/2017 0846   CREATININE 1.09 04/22/2014 0210   CALCIUM 8.3 (L) 03/03/2017 0846   CALCIUM 8.5 04/22/2014 0210   PROT 6.7 01/16/2017 0404   PROT 5.4 (L) 01/27/2014 0503   ALBUMIN 3.1 (L) 01/16/2017  0404   ALBUMIN 2.1 (L) 01/27/2014 0503   AST 24 01/16/2017 0404   AST 71 (H) 01/27/2014 0503   ALT 17 01/16/2017 0404   ALT 43 01/27/2014 0503   ALKPHOS 40 01/16/2017 0404   ALKPHOS 41 (L) 01/27/2014 0503   BILITOT 0.6 01/16/2017 0404   BILITOT 0.4 01/27/2014 0503   GFRNONAA 33 (L) 03/03/2017 0846   GFRNONAA 51 (L) 04/22/2014 0210   GFRAA 38 (L) 03/03/2017 0846   GFRAA 59 (L) 04/22/2014 0210     Recent Labs  01/15/17 1530  03/01/17 9937 03/02/17 1696  03/03/17 0846 03/09/17  NA  --   < > 136 136 137 135*  K  --   < > 4.1 4.2 4.4 4.5  CL  --   < > 107 110 108  --   CO2  --   < > 20* 20* 21*  --   GLUCOSE  --   < > 173* 114* 163*  --   BUN  --   < > 59* 55* 51* 45*  CREATININE  --   < > 1.91* 1.65* 1.53* 1.9*  CALCIUM  --   < > 7.9* 8.2* 8.3*  --   MG 1.6*  --   --   --   --   --   PHOS 3.4  --   --   --   --   --   < > = values in this interval not displayed. Liver Function Tests:  Recent Labs  04/08/16 0847 01/16/17 0404  AST 25 24  ALT 18 17  ALKPHOS 39 40  BILITOT <0.1* 0.6  PROT 7.2 6.7  ALBUMIN 3.3* 3.1*    Recent Labs  04/08/16 0847  LIPASE 40   No results for input(s): AMMONIA in the last 8760 hours. CBC:  Recent Labs  01/16/17 0404 02/16/17 0926 02/25/17 0802 02/26/17 1324 03/09/17  WBC 6.6 5.1 12.5*  --  12.8  HGB 9.9* 10.2* 8.8* 7.8* 8.0*  HCT 30.8* 31.7* 27.9* 23.0* 24*  MCV 88.0 89.0 86.6  --   --   PLT 229 227 224  --  320   Lipid No results for input(s): CHOL, HDL, LDLCALC, TRIG in the last 8760 hours.  Cardiac Enzymes:  Recent Labs  07/06/16 1801 07/06/16 2329 07/07/16 0616  TROPONINI <0.03 <0.03 <0.03   BNP: No results for input(s): BNP in the last 8760 hours. No results found for: [facility] Lab Results  Component Value Date   HGBA1C 5.7 (H) 02/16/2017   Lab Results  Component Value Date   TSH 0.140 (L) 02/24/2017   No results found for: VITAMINB12 No results found for: [facility] Lab Results  Component Value  Date   IRON 12 (L) 12/15/2013   TIBC 171 (L) 12/15/2013   FERRITIN 228 12/15/2013    Imaging and Procedures obtained prior to SNF admission: No results found.   Not all labs, radiology exams or other studies done during hospitalization come through on my EPIC note; however they are reviewed by me.    Assessment and Plan  LUMBAR STENOSIS/status post laminectomy plus other procedures-the actual surgery went well without complications; there were medical consultations as outlined below SNF - patient is admitted to skilled nursing facility for OT/PT. Patient complain of inadequate pain control is interfering with her therapies; plan to increase her oxycodone from 5/325 one by mouth every 6 when necessary to 7.5 mg every 6 scheduled and will schedule Flexeril as well  ACUTE BLOOD LOSS ANEMIA POST OP-hemoglobin dropped from 10.2-8.8; patient's DC hemoglobin is 8.0 SNF -will follow-up CBC; will continue iron 325 mg by mouth daily  ACCELERATED HYPERTENSION - onset prior to surgery; regular medications of Catapres Coreg Lasix and hydrochlorothiazide, and Cozaar were continued with the addition of IV labetalol if needed; this problem was resolved but when patient developed acute kidney injury Cozaar and Lasix and hydrochlorothiazide was decreased SNF - patient's blood pressure is moderately elevated on admission will continue Coreg 25 mg twice a day clonidine 0.2 mg twice a day, Lasix 20 mg daily, hydrochlorothiazide 25 mg  daily, losartan 100 mg daily and titrate as needed for better control; will follow-up BMP as patient is on diuretics  ACUTE RENAL FAILURE ON CKD3-creatinine rose to a high of 2.3 with baseline creatinine being 1.4-1.6; patient was treated with IV fluids and the cessation of Lasix hydrochlorothiazide and losartan;\ SNF - discharge from hospital BUN was 45 and creatinine 1.9; will follow-up BMP; encourage by mouth fluid intake  LETHARGY POST OP-patient's gabapentin was decreased  and her IV Dilaudid was stopped and her pain medicine interval was increased with improvement of her lethargy SNF - patient is fully awake and alert; will monitor with increase in her pain medications; family is aware  DM 2 SNF - blood sugars were controlled in the hospital with sliding scale insulin; hemoglobin A1c 3 weeks ago was 5.7 which shows good control; patient was placed back on her usual home med of metformin 500 mg by mouth twice a day; patient is on statin and on losartan  HYPOTHYROIDISM SNF - from what I can glean from labs patient's TSH was low and her free T4 was high at 1.47; admitted the patient was probably on Synthroid 100 mg before she entered the hospital and based on these findings was reduced to 88 g while in the hospital; however patient was DC'd on Synthroid 100 g daily; will change this to Synthroid 88 g daily  HYPERLIPIDEMIA SNF - not stated as uncontrolled we'll plan to continue Lipitor 40 mg by mouth daily    Time spent greater than 45 minutes;> 50% of time with patient was spent reviewing records, labs, tests and studies, counseling and developing plan of care  Webb Silversmith D. Sheppard Coil, MD

## 2017-03-14 ENCOUNTER — Encounter: Payer: Self-pay | Admitting: Internal Medicine

## 2017-03-14 DIAGNOSIS — D638 Anemia in other chronic diseases classified elsewhere: Secondary | ICD-10-CM | POA: Insufficient documentation

## 2017-03-14 DIAGNOSIS — N179 Acute kidney failure, unspecified: Secondary | ICD-10-CM | POA: Insufficient documentation

## 2017-03-14 DIAGNOSIS — D62 Acute posthemorrhagic anemia: Secondary | ICD-10-CM | POA: Insufficient documentation

## 2017-03-14 DIAGNOSIS — N183 Chronic kidney disease, stage 3 unspecified: Secondary | ICD-10-CM

## 2017-03-14 DIAGNOSIS — D649 Anemia, unspecified: Secondary | ICD-10-CM | POA: Insufficient documentation

## 2017-03-14 HISTORY — DX: Acute kidney failure, unspecified: N17.9

## 2017-03-14 HISTORY — DX: Acute posthemorrhagic anemia: D62

## 2017-03-14 HISTORY — DX: Chronic kidney disease, stage 3 unspecified: N18.30

## 2017-03-25 ENCOUNTER — Other Ambulatory Visit: Payer: Self-pay | Admitting: Neurological Surgery

## 2017-03-26 ENCOUNTER — Encounter (INDEPENDENT_AMBULATORY_CARE_PROVIDER_SITE_OTHER): Payer: Self-pay | Admitting: Ophthalmology

## 2017-03-26 ENCOUNTER — Encounter (HOSPITAL_COMMUNITY): Payer: Self-pay | Admitting: *Deleted

## 2017-03-26 NOTE — Pre-Procedure Instructions (Signed)
Taylor Tyler  03/26/2017      [place] [address], [place] - [place] [address] [place] [address] Phone: [phone] Fax: [phone]    Your procedure is scheduled on Monday, March 30, 2017  Report to [facility] Admitting at 5:30 A.M.  Call this number if you have problems the morning of surgery:  [phone]   Remember:  Do not eat food or drink liquids after midnight Sunday, March 29, 2017  Take these medicines the morning of surgery with A SIP OF WATER :carvedilol (COREG),  levothyroxine (SYNTHROID, LEVOTHROID), cloNIDine (CATAPRES), if needed: oxyCODONE-acetaminophen (PERCOCET) for pain, Propylene Glycol-Glycerin (SOOTHE) eye drops for dry/ gritty eyes Stop taking Aspirin, vitamins, fish oil, and herbal medications. Do not take any NSAIDs ie: Ibuprofen, Advil, Naproxen (ANAPROX/Aleve), Motrin, BC and Goody Powder or any medication containing Aspirin; stop now.    How to Manage Your Diabetes Before and After Surgery  Why is it important to control my blood sugar before and after surgery? . Improving blood sugar levels before and after surgery helps healing and can limit problems. . A way of improving blood sugar control is eating a healthy diet by: o  Eating less sugar and carbohydrates o  Increasing activity/exercise o  Talking with your doctor about reaching your blood sugar goals . High blood sugars (greater than 180 mg/dL) can raise your risk of infections and slow your recovery, so you will need to focus on controlling your diabetes during the weeks before surgery. . Make sure that the doctor who takes care of your diabetes knows about your planned surgery including the date and location.  How do I manage my blood sugar before surgery? . Check your blood sugar at least 4 times a day, starting 2 days before surgery, to make sure that the level is not too high or low. o Check your blood sugar the morning of your surgery when  you wake up and every 2 hours until you get to the Short Stay unit. . If your blood sugar is less than 70 mg/dL, you will need to treat for low blood sugar: o Do not take insulin. o Treat a low blood sugar (less than 70 mg/dL) with  cup of clear juice (cranberry or apple), 4 glucose tablets, OR glucose gel. o Recheck blood sugar in 15 minutes after treatment (to make sure it is greater than 70 mg/dL). If your blood sugar is not greater than 70 mg/dL on recheck, call [phone] for further instructions. . Report your blood sugar to the short stay nurse when you get to Short Stay.  . If you are admitted to the hospital after surgery: o Your blood sugar will be checked by the staff and you will probably be given insulin after surgery (instead of oral diabetes medicines) to make sure you have good blood sugar levels. o The goal for blood sugar control after surgery is 80-180 mg/dL  WHAT DO I DO ABOUT MY DIABETES MEDICATION?   Marland Kitchen Do not take oral diabetes medicines (pills) the morning of surgery such as metFORMIN (GLUCOPHAGE)  Reviewed and Endorsed by [facility] Patient Education Committee, August 2015  Do not wear jewelry, make-up or nail polish.  Do not wear lotions, powders, or perfumes, or deoderant.  Do not shave 48 hours prior to surgery.    Do not bring valuables to the hospital.  [facility] is not responsible for any belongings or valuables.  Contacts, dentures or bridgework  may not be worn into surgery.  Leave your suitcase in the car.  After surgery it may be brought to your room. For patients admitted to the hospital, discharge time will be determined by your treatment team. Patients discharged the day of surgery will not be allowed to drive home.  Please read over the following fact sheets that you were given.

## 2017-03-26 NOTE — Progress Notes (Signed)
Pt SDW-Pre-op call completed by pt nurse, Lenward Chancellor, RN, of [facility]. Pt not available to complete anesthesia assessment and Sleep Apnea Screening; please complete DOS. Nurse denies that pt C/O Sob and chest pain. Nurse stated that pt has not been under the care of a cardiologist since admission. Nurse reviewed pt AM medications according to facility [facility]. Nurse confirmed receipt of and reviewed faxed pre-op instructions. Nurse verbalized understanding of all pre-op instructions.

## 2017-03-26 NOTE — Progress Notes (Signed)
Anesthesia Chart Review:  Pt is a same day work up.   Pt is a 76 year old female scheduled for lumbar wound exploration/debridement, placement of wound vac on 03/30/2017 with Cyndy Freeze, MD  - PCP is Hipolito Bayley, MD   - Cardiologist is Lujean Amel, MD  PMH includes: HTN, DM, CKD, disease of pancreas. Never smoker. S/p lumbar laminectomy, L3-S1 dorsal internal fixation 02/26/17. S/p ALIF 02/24/17.   Anesthesia history: BP was difficult to control during surgeries 02/24/17 and 02/26/17. Please review anesthesia records in Epic.   - Hospitalized 7/24-31/18 for planned elective lumbar surgeries.  Hospital course complicated by accelerated HTN and AKI. Pt was d/c to SNF.  - Hospitalized 6/14-15/18 for hypertensive emergency.  Pt presented for elective lumbar surgery, was found to have systolic BP consistently above 215; surgery was postponed and pt admitted for BP control.   Medications include: Lipitor, carvedilol, clonidine, iron, Lasix, HCTZ, levothyroxine, losartan, metformin   Labs will be obtained DOS.   1 view CXR 02/26/17: no acute findings.   EKG 09/05/16: Marked sinus bradycardia (41 bpm) with Premature supraventricular complexes. ST & T wave abnormality, consider inferolateral ischemia. Appears stable when compared to EKGs dating back to 03/01/13.   Echo 01/16/17:  - Left ventricle: The cavity size was normal. Wall thickness wasincreased in a pattern of moderate LVH. Systolic function wasnormal. The estimated ejection fraction was in the range of 55%to 60%. Wall motion was normal; there were no regional wallmotion abnormalities. - Left atrium: The atrium was mildly dilated. - Atrial septum: No defect or patent foramen ovale was identified.  If BP and labs acceptable DOS, I anticipate pt can proceed with surgery as scheduled.   Willeen Cass, FNP-BC [facility]/Anesthesiology Phone: [phone] 03/26/2017 12:19 PM

## 2017-03-29 ENCOUNTER — Other Ambulatory Visit: Payer: Self-pay

## 2017-03-29 ENCOUNTER — Encounter (HOSPITAL_COMMUNITY): Payer: Self-pay | Admitting: Emergency Medicine

## 2017-03-29 ENCOUNTER — Inpatient Hospital Stay (HOSPITAL_COMMUNITY): Payer: Medicare Other

## 2017-03-29 ENCOUNTER — Emergency Department (HOSPITAL_COMMUNITY): Payer: Medicare Other

## 2017-03-29 ENCOUNTER — Inpatient Hospital Stay (HOSPITAL_COMMUNITY)
Admission: EM | Admit: 2017-03-29 | Discharge: 2017-04-07 | DRG: 908 | Disposition: A | Discharge status: Skilled Nursing Facility | Payer: Medicare Other | Attending: Family Medicine | Admitting: Family Medicine

## 2017-03-29 DIAGNOSIS — R5383 Other fatigue: Secondary | ICD-10-CM | POA: Diagnosis present

## 2017-03-29 DIAGNOSIS — E1122 Type 2 diabetes mellitus with diabetic chronic kidney disease: Secondary | ICD-10-CM | POA: Diagnosis present

## 2017-03-29 DIAGNOSIS — E1129 Type 2 diabetes mellitus with other diabetic kidney complication: Secondary | ICD-10-CM | POA: Diagnosis present

## 2017-03-29 DIAGNOSIS — T8131XA Disruption of external operation (surgical) wound, not elsewhere classified, initial encounter: Principal | ICD-10-CM | POA: Diagnosis present

## 2017-03-29 DIAGNOSIS — M48061 Spinal stenosis, lumbar region without neurogenic claudication: Secondary | ICD-10-CM | POA: Diagnosis present

## 2017-03-29 DIAGNOSIS — B964 Proteus (mirabilis) (morganii) as the cause of diseases classified elsewhere: Secondary | ICD-10-CM | POA: Diagnosis present

## 2017-03-29 DIAGNOSIS — Z7984 Long term (current) use of oral hypoglycemic drugs: Secondary | ICD-10-CM

## 2017-03-29 DIAGNOSIS — T8130XA Disruption of wound, unspecified, initial encounter: Secondary | ICD-10-CM | POA: Diagnosis not present

## 2017-03-29 DIAGNOSIS — N183 Chronic kidney disease, stage 3 unspecified: Secondary | ICD-10-CM | POA: Diagnosis present

## 2017-03-29 DIAGNOSIS — E785 Hyperlipidemia, unspecified: Secondary | ICD-10-CM | POA: Diagnosis not present

## 2017-03-29 DIAGNOSIS — I10 Essential (primary) hypertension: Secondary | ICD-10-CM

## 2017-03-29 DIAGNOSIS — R197 Diarrhea, unspecified: Secondary | ICD-10-CM

## 2017-03-29 DIAGNOSIS — Z9889 Other specified postprocedural states: Secondary | ICD-10-CM

## 2017-03-29 DIAGNOSIS — R4182 Altered mental status, unspecified: Secondary | ICD-10-CM

## 2017-03-29 DIAGNOSIS — Y838 Other surgical procedures as the cause of abnormal reaction of the patient, or of later complication, without mention of misadventure at the time of the procedure: Secondary | ICD-10-CM | POA: Diagnosis present

## 2017-03-29 DIAGNOSIS — D62 Acute posthemorrhagic anemia: Secondary | ICD-10-CM | POA: Diagnosis present

## 2017-03-29 DIAGNOSIS — R404 Transient alteration of awareness: Secondary | ICD-10-CM

## 2017-03-29 DIAGNOSIS — Z981 Arthrodesis status: Secondary | ICD-10-CM

## 2017-03-29 DIAGNOSIS — E860 Dehydration: Secondary | ICD-10-CM | POA: Diagnosis present

## 2017-03-29 DIAGNOSIS — I129 Hypertensive chronic kidney disease with stage 1 through stage 4 chronic kidney disease, or unspecified chronic kidney disease: Secondary | ICD-10-CM | POA: Diagnosis present

## 2017-03-29 DIAGNOSIS — R339 Retention of urine, unspecified: Secondary | ICD-10-CM | POA: Diagnosis present

## 2017-03-29 DIAGNOSIS — N179 Acute kidney failure, unspecified: Secondary | ICD-10-CM | POA: Diagnosis not present

## 2017-03-29 DIAGNOSIS — Z79899 Other long term (current) drug therapy: Secondary | ICD-10-CM | POA: Diagnosis not present

## 2017-03-29 DIAGNOSIS — T814XXA Infection following a procedure, initial encounter: Secondary | ICD-10-CM | POA: Diagnosis present

## 2017-03-29 DIAGNOSIS — E034 Atrophy of thyroid (acquired): Secondary | ICD-10-CM | POA: Diagnosis not present

## 2017-03-29 DIAGNOSIS — L8990 Pressure ulcer of unspecified site, unspecified stage: Secondary | ICD-10-CM | POA: Insufficient documentation

## 2017-03-29 DIAGNOSIS — E784 Other hyperlipidemia: Secondary | ICD-10-CM | POA: Diagnosis not present

## 2017-03-29 DIAGNOSIS — Z888 Allergy status to other drugs, medicaments and biological substances status: Secondary | ICD-10-CM

## 2017-03-29 DIAGNOSIS — E039 Hypothyroidism, unspecified: Secondary | ICD-10-CM | POA: Diagnosis present

## 2017-03-29 DIAGNOSIS — T814XXD Infection following a procedure, subsequent encounter: Secondary | ICD-10-CM | POA: Diagnosis not present

## 2017-03-29 DIAGNOSIS — Z6841 Body Mass Index (BMI) 40.0 and over, adult: Secondary | ICD-10-CM | POA: Diagnosis not present

## 2017-03-29 DIAGNOSIS — T8149XA Infection following a procedure, other surgical site, initial encounter: Secondary | ICD-10-CM | POA: Diagnosis present

## 2017-03-29 HISTORY — DX: Altered mental status, unspecified: R41.82

## 2017-03-29 LAB — CBC WITH DIFFERENTIAL/PLATELET
Basophils Absolute: 0 10*3/uL (ref 0.0–0.1)
Basophils Relative: 0 %
EOS ABS: 0 10*3/uL (ref 0.0–0.7)
EOS PCT: 0 %
HCT: 24.8 % — ABNORMAL LOW (ref 36.0–46.0)
HEMOGLOBIN: 7.8 g/dL — AB (ref 12.0–15.0)
LYMPHS ABS: 1.1 10*3/uL (ref 0.7–4.0)
Lymphocytes Relative: 15 %
MCH: 26.9 pg (ref 26.0–34.0)
MCHC: 31.5 g/dL (ref 30.0–36.0)
MCV: 85.5 fL (ref 78.0–100.0)
MONOS PCT: 14 %
Monocytes Absolute: 1 10*3/uL (ref 0.1–1.0)
NEUTROS PCT: 71 %
Neutro Abs: 4.9 10*3/uL (ref 1.7–7.7)
Platelets: 302 10*3/uL (ref 150–400)
RBC: 2.9 MIL/uL — ABNORMAL LOW (ref 3.87–5.11)
RDW: 14.1 % (ref 11.5–15.5)
WBC: 6.9 10*3/uL (ref 4.0–10.5)

## 2017-03-29 LAB — PHOSPHORUS: Phosphorus: 3.1 mg/dL (ref 2.5–4.6)

## 2017-03-29 LAB — COMPREHENSIVE METABOLIC PANEL
ALBUMIN: 2.4 g/dL — AB (ref 3.5–5.0)
ALT: 18 U/L (ref 14–54)
AST: 28 U/L (ref 15–41)
Alkaline Phosphatase: 90 U/L (ref 38–126)
Anion gap: 11 (ref 5–15)
BUN: 87 mg/dL — AB (ref 6–20)
CHLORIDE: 105 mmol/L (ref 101–111)
CO2: 21 mmol/L — AB (ref 22–32)
CREATININE: 2.87 mg/dL — AB (ref 0.44–1.00)
Calcium: 8.7 mg/dL — ABNORMAL LOW (ref 8.9–10.3)
GFR calc Af Amer: 18 mL/min — ABNORMAL LOW (ref >60)
GFR calc non Af Amer: 15 mL/min — ABNORMAL LOW (ref >60)
Glucose, Bld: 118 mg/dL — ABNORMAL HIGH (ref 65–99)
Potassium: 4.3 mmol/L (ref 3.5–5.1)
SODIUM: 137 mmol/L (ref 135–145)
Total Bilirubin: 0.6 mg/dL (ref 0.3–1.2)
Total Protein: 6.5 g/dL (ref 6.5–8.1)

## 2017-03-29 LAB — C DIFFICILE QUICK SCREEN W PCR REFLEX
C Diff antigen: NEGATIVE
C Diff interpretation: NOT DETECTED
C Diff toxin: NEGATIVE

## 2017-03-29 LAB — URINALYSIS, ROUTINE W REFLEX MICROSCOPIC
BACTERIA UA: NONE SEEN
BILIRUBIN URINE: NEGATIVE
Glucose, UA: NEGATIVE mg/dL
Ketones, ur: NEGATIVE mg/dL
NITRITE: NEGATIVE
PH: 5 (ref 5.0–8.0)
Protein, ur: 30 mg/dL — AB
SPECIFIC GRAVITY, URINE: 1.012 (ref 1.005–1.030)

## 2017-03-29 LAB — CBG MONITORING, ED: Glucose-Capillary: 102 mg/dL — ABNORMAL HIGH (ref 65–99)

## 2017-03-29 LAB — I-STAT ARTERIAL BLOOD GAS, ED
Acid-base deficit: 2 mmol/L (ref 0.0–2.0)
Bicarbonate: 21.2 mmol/L (ref 20.0–28.0)
O2 Saturation: 96 %
PCO2 ART: 30.7 mmHg — AB (ref 32.0–48.0)
Patient temperature: 98.6
TCO2: 22 mmol/L (ref 22–32)
pH, Arterial: 7.448 (ref 7.350–7.450)
pO2, Arterial: 75 mmHg — ABNORMAL LOW (ref 83.0–108.0)

## 2017-03-29 LAB — I-STAT CG4 LACTIC ACID, ED: LACTIC ACID, VENOUS: 1.17 mmol/L (ref 0.5–1.9)

## 2017-03-29 LAB — PROTIME-INR
INR: 1.46
PROTHROMBIN TIME: 17.9 s — AB (ref 11.4–15.2)

## 2017-03-29 LAB — TSH
TSH: 1.167 u[IU]/mL (ref 0.350–4.500)
TSH: 0.984 u[IU]/mL (ref 0.350–4.500)

## 2017-03-29 LAB — LIPASE, BLOOD: LIPASE: 72 U/L — AB (ref 11–51)

## 2017-03-29 LAB — POC OCCULT BLOOD, ED: Fecal Occult Bld: NEGATIVE

## 2017-03-29 LAB — GLUCOSE, CAPILLARY: GLUCOSE-CAPILLARY: 97 mg/dL (ref 65–99)

## 2017-03-29 LAB — BRAIN NATRIURETIC PEPTIDE: B Natriuretic Peptide: 492.3 pg/mL — ABNORMAL HIGH (ref 0.0–100.0)

## 2017-03-29 LAB — MAGNESIUM: Magnesium: 2.1 mg/dL (ref 1.7–2.4)

## 2017-03-29 LAB — ETHANOL: Alcohol, Ethyl (B): <5 mg/dL (ref <5)

## 2017-03-29 MED ORDER — METOPROLOL TARTRATE 5 MG/5ML IV SOLN
5.0000 mg | Freq: Four times a day (QID) | INTRAVENOUS | Status: DC
  Administered 2017-03-29 – 2017-03-30 (×2): 5 mg via INTRAVENOUS
  Filled 2017-03-29 (×2): qty 5

## 2017-03-29 MED ORDER — VANCOMYCIN HCL IN DEXTROSE 1-5 GM/200ML-% IV SOLN: 1000.0000 mg | INTRAVENOUS | Status: DC

## 2017-03-29 MED ORDER — ATORVASTATIN CALCIUM 40 MG PO TABS
40.0000 mg | ORAL_TABLET | Freq: Every evening | ORAL | Status: DC
  Administered 2017-03-30 – 2017-04-07 (×9): 40 mg via ORAL
  Filled 2017-03-29 (×9): qty 1

## 2017-03-29 MED ORDER — ADULT MULTIVITAMIN W/MINERALS CH
1.0000 | ORAL_TABLET | Freq: Every day | ORAL | Status: DC
  Administered 2017-03-31 – 2017-04-07 (×8): 1 via ORAL
  Filled 2017-03-29 (×8): qty 1

## 2017-03-29 MED ORDER — ONDANSETRON HCL 4 MG/2ML IJ SOLN: 4.0000 mg | Freq: Four times a day (QID) | INTRAMUSCULAR | Status: DC | PRN

## 2017-03-29 MED ORDER — LEVOTHYROXINE SODIUM 100 MCG PO TABS: 100.0000 ug | ORAL_TABLET | Freq: Every day | ORAL | Status: DC

## 2017-03-29 MED ORDER — DEXTROSE-NACL 5-0.9 % IV SOLN
INTRAVENOUS | Status: DC
  Administered 2017-03-29: 21:00:00 via INTRAVENOUS

## 2017-03-29 MED ORDER — SODIUM CHLORIDE 0.9 % IV BOLUS (SEPSIS)
500.0000 mL | Freq: Once | INTRAVENOUS | Status: AC
  Administered 2017-03-29: 500 mL via INTRAVENOUS

## 2017-03-29 MED ORDER — POLYVINYL ALCOHOL 1.4 % OP SOLN
1.0000 [drp] | Freq: Every day | OPHTHALMIC | Status: DC | PRN
  Filled 2017-03-29: qty 15

## 2017-03-29 MED ORDER — ONDANSETRON HCL 4 MG PO TABS
4.0000 mg | ORAL_TABLET | Freq: Four times a day (QID) | ORAL | Status: DC | PRN
Start: 2017-03-29 — End: 2017-03-30

## 2017-03-29 MED ORDER — HEPARIN SODIUM (PORCINE) 5000 UNIT/ML IJ SOLN
5000.0000 [IU] | Freq: Three times a day (TID) | INTRAMUSCULAR | Status: DC
  Administered 2017-03-29 – 2017-04-07 (×25): 5000 [IU] via SUBCUTANEOUS
  Filled 2017-03-29 (×20): qty 1

## 2017-03-29 MED ORDER — PIPERACILLIN-TAZOBACTAM IN DEX 2-0.25 GM/50ML IV SOLN
2.2500 g | Freq: Four times a day (QID) | INTRAVENOUS | Status: DC
  Administered 2017-03-30 – 2017-03-31 (×5): 2.25 g via INTRAVENOUS
  Filled 2017-03-29 (×8): qty 50

## 2017-03-29 MED ORDER — INSULIN ASPART 100 UNIT/ML ~~LOC~~ SOLN
0.0000 [IU] | Freq: Three times a day (TID) | SUBCUTANEOUS | Status: DC
  Administered 2017-03-30: 2 [IU] via SUBCUTANEOUS
  Administered 2017-03-31 – 2017-04-02 (×4): 1 [IU] via SUBCUTANEOUS
  Administered 2017-04-02: 7 [IU] via SUBCUTANEOUS

## 2017-03-29 MED ORDER — SODIUM CHLORIDE 0.9 % IV SOLN
1750.0000 mg | Freq: Once | INTRAVENOUS | Status: AC
  Administered 2017-03-30: 1750 mg via INTRAVENOUS
  Filled 2017-03-29: qty 1750

## 2017-03-29 NOTE — Progress Notes (Signed)
Pharmacy Antibiotic Note  Taylor Tyler is a 76 y.o. female admitted on 03/29/2017 with AMS and concern for sepsis.  Pharmacy has been consulted for vancomycin and zosyn dosing.  Temperature and WBC normal. LA slightly elevated at 1.17. Notable AKI with SCr 2.87 for estimated CrCl ~ 15-20 mL/min.   Plan: Vancomycin 1.75g IV x1, then 1g IV q48hr  Zosyn 2.25g IV q6hr Vancomycin trough at [facility] and as needed (goal 15-20 mcg/mL) Monitor renal function, clinical picture, and culture data   Height: 5' (152.4 cm) Weight: 216 lb 11.2 oz (98.3 kg) IBW/kg (Calculated) : 45.5  Temp (24hrs), Avg:98.2 F (36.8 C), Min:97.6 F (36.4 C), Max:98.7 F (37.1 C)   Recent Labs Lab 03/29/17 1323 03/29/17 1329  WBC 6.9  --   CREATININE 2.87*  --   LATICACIDVEN  --  1.17    Estimated Creatinine Clearance: 18.1 mL/min (A) (by C-G formula based on SCr of 2.87 mg/dL (H)).    Allergies  Allergen Reactions  . Clams [Shellfish Allergy] Swelling and Other (See Comments)    THROAT SWELLS NECK TURNS RED  . Hydralazine Other (See Comments)    CHEST TIGHTNESS    Antimicrobials this admission: 8/26 Vanc >>  8/26 Zosyn >>   Microbiology results: 8/26 Urine cx: collected 8/26 cdiff: neg antigen and toxin  8/26 Blood cx: collected  Argie Ramming, PharmD Clinical Pharmacist 03/29/17 11:27 PM

## 2017-03-29 NOTE — ED Provider Notes (Signed)
[facility] DEPT Provider Note   CSN: [id number] Arrival date & time: 03/29/17  1215     History   Chief Complaint Chief Complaint  Patient presents with  . Weakness    HPI Taylor Tyler is a 76 y.o. female.  The history is provided by the patient and the EMS personnel. No language interpreter was used.  Weakness     Taylor Tyler is a 76 y.o. female who presents to the Emergency Department complaining of weakness, confusion.  Level V caveat due to confusion.  She presents from [facility] rehabilitation for evaluation of nausea, vomiting, diarrhea for the last week as well as weakness and confusion. She states she is having 2 or more episodes of vomiting and diarrhea daily with some abdominal discomfort. It is unclear she's having fevers. Per [facility] notes that she is more confused than baseline. She denies any chest pain, shortness of breath. She is currently recovering from lumbar surgery and has a wound to her lower back with dressing that was changed prior to ED arrival. She does have a follow-up appointment with neurosurgery tomorrow for wound evaluation.  Past Medical History:  Diagnosis Date  . Anemia   . Arthritis   . Chronic kidney disease    ?? renal insufficiency,   . Diabetes mellitus without complication ([place])    diagnosed 4-5 yrs ago  . Disease of pancreas   . DJD (degenerative joint disease)   . Hypertension   . Hypothyroidism   . Wound healing, delayed    back    Patient Active Problem List   Diagnosis Date Noted  . Anemia 03/14/2017  . Acute blood loss as cause of postoperative anemia 03/14/2017  . Acute renal failure superimposed on stage 3 chronic kidney disease ([place]) 03/14/2017  . Bilateral lower extremity edema 03/04/2017  . AKI (acute kidney injury) ([place]) 02/28/2017  . Lethargy 02/28/2017  . Obesity, Class III, BMI 40-49.9 (morbid obesity) ([place]) 02/27/2017  . DM (diabetes mellitus), type 2 with renal complications ([place]) 16/60/6301  .  Status post lumbar surgery 02/24/2017  . Hypertensive emergency 01/15/2017  . CKD (chronic kidney disease) stage 3, GFR 30-59 ml/min 01/15/2017  . Hypothyroidism 01/15/2017  . Diverticulosis 01/15/2017  . HLD (hyperlipidemia) 01/15/2017  . Spinal stenosis of lumbar region 01/15/2017  . Low back pain 09/12/2016  . Chest pain 09/05/2016  . Accelerated hypertension 07/06/2016  . Disease of pancreas 07/21/2012    Past Surgical History:  Procedure Laterality Date  . ABDOMINAL EXPOSURE N/A 02/24/2017   Procedure: ABDOMINAL EXPOSURE;  Surgeon: Angelia Mould, MD;  Location: [facility];  Service: Vascular;  Laterality: N/A;  . ABDOMINAL HYSTERECTOMY  1987  . ANTERIOR LAT LUMBAR FUSION N/A 02/24/2017   Procedure: Lumbar three- five Anterior lateral lumbar interbody fusion;  Surgeon: Ditty, Kevan Ny, MD;  Location: [place];  Service: Neurosurgery;  Laterality: N/A;  L3-5 Anterior lateral lumbar interbody fusion with removal of coflex at L3-4, L4-5  . ANTERIOR LUMBAR FUSION N/A 02/24/2017   Procedure: Stage 1: Lumbar five-Sacral one Anterior lumbar interbody fusion;  Surgeon: Ditty, Kevan Ny, MD;  Location: [facility];  Service: Neurosurgery;  Laterality: N/A;  Stage 1: L5-S1 Anterior lumbar interbody fusion  . APPLICATION OF ROBOTIC ASSISTANCE FOR SPINAL PROCEDURE N/A 02/26/2017   Procedure: APPLICATION OF ROBOTIC ASSISTANCE FOR SPINAL PROCEDURE;  Surgeon: Ditty, Kevan Ny, MD;  Location: [facility];  Service: Neurosurgery;  Laterality: N/A;  . BACK SURGERY  2013  . BREAST SURGERY  1992   Breast Reduction  . CHOLECYSTECTOMY  1982  . COLON SURGERY  2015  . COLON SURGERY    . EYE SURGERY Right 2017   cataracts  . [place]  . LUMBAR FUSION  02/24/2017  . LUMBAR LAMINECTOMY WITH SPINOUS PROCESS PLATE 2 LEVEL N/A 5/80/9983   Procedure: LUMBAR LAMINECTOMY/DECOMPRESSION MICRODISCECTOMY CoFlex;  Surgeon: Faythe Ghee, MD;  Location: [facility];  Service: Neurosurgery;   Laterality: N/A;  Lumbar three-four,Lumbar Four-Five Laminectomy with Coflex    OB History    No data available       Home Medications    Prior to Admission medications   Medication Sig Start Date End Date Taking? Authorizing Provider  acetaminophen (TYLENOL) 325 MG tablet Take 650 mg by mouth every 6 (six) hours as needed for mild pain.   Yes [provider]  atorvastatin (LIPITOR) 40 MG tablet Take 40 mg by mouth every evening.   Yes [provider]  carvedilol (COREG) 25 MG tablet Take 25 mg by mouth 2 (two) times daily.  11/20/15  Yes [provider]  cloNIDine (CATAPRES) 0.2 MG tablet Take 0.2 mg by mouth 2 (two) times daily.   Yes [provider]  cyclobenzaprine (FLEXERIL) 5 MG tablet Take 1 tablet (5 mg total) by mouth 3 (three) times daily as needed for muscle spasms. 03/03/17  Yes Costella, Vista Mink, PA-C  doxycycline (VIBRA-TABS) 100 MG tablet Take 100 mg by mouth 2 (two) times daily. indefinitely for surgical wound infection to back 03/27/17  Yes [provider]  ergocalciferol (VITAMIN D2) 50000 UNITS capsule Take 50,000 Units by mouth once a week. On Tuesdays   Yes [provider]  ferrous sulfate 325 (65 FE) MG tablet Take 325 mg by mouth daily with breakfast.   Yes [provider]  furosemide (LASIX) 20 MG tablet Take 20 mg by mouth daily.  12/30/16  Yes [provider]  hydrochlorothiazide (HYDRODIURIL) 25 MG tablet Take 25 mg by mouth daily.  12/20/16  Yes [provider]  levothyroxine (SYNTHROID, LEVOTHROID) 88 MCG tablet Take 88 mcg by mouth daily before breakfast.  11/20/15  Yes [provider]  losartan (COZAAR) 100 MG tablet Take 100 mg by mouth daily after breakfast.  11/20/15  Yes [provider]  Magnesium Oxide 250 MG TABS Take 250 mg by mouth daily.   Yes [provider]  metFORMIN (GLUCOPHAGE) 500 MG tablet Take 500 mg by mouth 2 (two) times daily.    Yes  [provider]  oxyCODONE-acetaminophen (PERCOCET) 7.5-325 MG tablet Take 1 tablet by mouth every 6 (six) hours as needed for severe pain.   Yes [provider]  Propylene Glycol-Glycerin (SOOTHE) 0.6-0.6 % SOLN Place 1 drop into both eyes daily as needed (for dry/gritty eyes.).    Yes [provider]  oxyCODONE-acetaminophen (PERCOCET/ROXICET) 5-325 MG tablet Take 1 tablet by mouth every 6 (six) hours as needed for severe pain. Patient not taking: Reported on 03/29/2017 03/03/17   Traci Sermon, PA-C    Family History Family History  Problem Relation Age of Onset  . Diabetes Father   . Heart attack Father   . Hypertension Father   . Diabetes Mother   . Hypertension Mother     Social History Social History  Substance Use Topics  . Smoking status: Never Smoker  . Smokeless tobacco: Never Used  . Alcohol use No     Allergies   Clams [shellfish allergy] and  Hydralazine   Review of Systems Review of Systems  Neurological: Positive for weakness.  All other systems reviewed and are negative.    Physical Exam Updated Vital Signs BP (!) 175/63   Pulse (!) 56   Temp 98.4 F (36.9 C) (Rectal)   Resp 18   Ht 5\' 3"  (1.6 m)   Wt 98.4 kg (217 lb)   SpO2 100%   BMI 38.44 kg/m   Physical Exam  Constitutional: She appears well-developed and well-nourished.  HENT:  Head: Normocephalic and atraumatic.  Cardiovascular: Normal rate and regular rhythm.   No murmur heard. Pulmonary/Chest: Effort normal and breath sounds normal. No respiratory distress.  Abdominal: There is no rebound and no guarding.  Mild diffuse abdominal tenderness. Abdominal bloating.  Musculoskeletal: She exhibits no tenderness.  2-3+ pitting edema to bilateral lower extremities  Neurological: She is alert.  Confused, oriented to time and place. The generalized weakness, greatest in bilateral lower extremities  Skin: Skin is warm and dry.  Psychiatric: She has a normal  mood and affect. Her behavior is normal.  Nursing note and vitals reviewed.    ED Treatments / Results  Labs (all labs ordered are listed, but only abnormal results are displayed) Labs Reviewed  COMPREHENSIVE METABOLIC PANEL - Abnormal; Notable for the following:       Result Value   CO2 21 (*)    Glucose, Bld 118 (*)    BUN 87 (*)    Creatinine, Ser 2.87 (*)    Calcium 8.7 (*)    Albumin 2.4 (*)    GFR calc non Af Amer 15 (*)    GFR calc Af Amer 18 (*)    All other components within normal limits  CBC WITH DIFFERENTIAL/PLATELET - Abnormal; Notable for the following:    RBC 2.90 (*)    Hemoglobin 7.8 (*)    HCT 24.8 (*)    All other components within normal limits  LIPASE, BLOOD - Abnormal; Notable for the following:    Lipase 72 (*)    All other components within normal limits  C DIFFICILE QUICK SCREEN W PCR REFLEX  URINALYSIS, ROUTINE W REFLEX MICROSCOPIC  I-STAT CG4 LACTIC ACID, ED  I-STAT CHEM 8, ED  POC OCCULT BLOOD, ED  CBG MONITORING, ED    EKG  EKG Interpretation None       Radiology Ct Abdomen Pelvis Wo Contrast  Result Date: 03/29/2017 CLINICAL DATA:  Nausea, vomiting and weakness for 1 week. Altered mental status. Back surgery 1 month ago. EXAM: CT ABDOMEN AND PELVIS WITHOUT CONTRAST TECHNIQUE: Multidetector CT imaging of the abdomen and pelvis was performed following the standard protocol without IV contrast. COMPARISON:  Abdominopelvic CT 07/07/2016. Intraoperative lumbar radiographs 02/26/2017. FINDINGS: Lower chest: There is stable subpleural lipoma at the right lung base on image 8. The lung bases are otherwise clear. No significant pleural or pericardial effusion. The heart is mildly enlarged. Hepatobiliary: Progressive diffuse hepatic steatosis. No focal abnormalities are seen on noncontrast imaging. No significant biliary dilatation status post cholecystectomy. Pancreas: Unremarkable. No pancreatic ductal dilatation or surrounding inflammatory  changes. Spleen: Normal in size without focal abnormality. Adrenals/Urinary Tract: Both adrenal glands appear normal. Stable small nonobstructing calculus in the lower pole of the left kidney. No other urinary tract calculi are demonstrated. There is stable mild renal cortical thinning bilaterally. Chronic perinephric soft tissue stranding has improved. There is no hydronephrosis. The bladder appears unremarkable. Stomach/Bowel: No evidence of bowel wall thickening, distention or surrounding inflammatory change. The distal  colonic anastomosis appears patent status post partial colectomy. There are diverticular changes throughout the colon. Broad-based ventral hernia containing transverse colon and multiple loops of small bowel is unchanged. No evidence of incarceration or obstruction. Vascular/Lymphatic: There are no enlarged abdominal or pelvic lymph nodes. Aortic and branch vessel atherosclerosis noted. Reproductive: Hysterectomy. No evidence of adnexal mass. Probable pelvic floor laxity. Other: As above, large broad-based ventral hernia, grossly stable. No ascites or free air. Musculoskeletal: No acute or significant osseous findings. Interval revision of lower lumbar fusion. The patient is now fused from L3 through S1. The hardware appears intact. There is an open midline wound superiorly with apparent packing material and surrounding soft tissue emphysema. There is no large paraspinal fluid collection. No bone destruction identified. IMPRESSION: 1. No acute abdominopelvic findings identified. 2. Stable large ventral hernia without evidence of incarceration or obstruction. 3. Interval revision of lumbar fusion. There is an open wound containing apparent packing material. 4. Diffuse hepatic steatosis. 5.  Aortic Atherosclerosis (ICD10-I70.0). Electronically Signed   By: Richardean Sale M.D.   On: 03/29/2017 16:13   Ct Head Wo Contrast  Result Date: 03/29/2017 CLINICAL DATA:  Nausea, vomiting, and weakness for  1 week. EXAM: CT HEAD WITHOUT CONTRAST TECHNIQUE: Contiguous axial images were obtained from the base of the skull through the vertex without intravenous contrast. COMPARISON:  April 21, 2014 FINDINGS: Brain: No subdural, epidural, or subarachnoid hemorrhage identified on today's study. Cerebellum, brainstem, and basal cisterns are within normal limits. No mass effect or midline shift. Ventricles and sulci are unremarkable. No acute cortical ischemia or infarct. Vascular: Calcified atherosclerosis is seen in the intracranial carotid arteries. Skull: Normal. Negative for fracture or focal lesion. Sinuses/Orbits: There is a small amount of fluid or mucus in the sphenoid sinuses. This is similar compared to the previous study. Paranasal sinuses, mastoid air cells, and middle ears are otherwise normal. Other: There is a stable subcutaneous nodule in the posterior scalp on series 3, image 9 of no acute significance. Extracranial soft tissues are otherwise normal. IMPRESSION: 1. No acute intracranial abnormality identified. 2. Mild sinus disease as above. Electronically Signed   By: Dorise Bullion III M.D   On: 03/29/2017 16:05    Procedures Procedures (including critical care time)  Medications Ordered in ED Medications  sodium chloride 0.9 % bolus 500 mL (500 mLs Intravenous New Bag/Given 03/29/17 1521)     Initial Impression / Assessment and Plan / ED Course  I have reviewed the triage vital signs and the nursing notes.  Pertinent labs & imaging results that were available during my care of the patient were reviewed by me and considered in my medical decision making (see chart for details).     Ms. Klahr presents for evaluation of vomiting and diarrhea which changes in mental status over the last week. She is mildly confused on examination with no focal deficits. Labs demonstrate mild worsening in her renal function, likely secondary to dehydration from her vomiting and diarrhea. She was provided  with some IV fluids. CT abdomen with no acute process. CT head obtained given her change in mental status with no acute abnormalities. Question if her confusion is secondary to acute illness as well as medications. Hospitalist consulted for admission for AKI, dehydration, altered mental status.  Final Clinical Impressions(s) / ED Diagnoses   Final diagnoses:  None    New Prescriptions New Prescriptions   No medications on file     Quintella Reichert, MD 03/29/17 1628

## 2017-03-29 NOTE — ED Triage Notes (Signed)
Per EMS: Pt coming from Rehab for back.  Pt had back surgery a month ago and has an appt tmrw for follow up. Pt c/o N/V and weakness x 1 week.  Pt is A&Ox4 but is slow to answer questions.  Pt appears to be intoxicated or overly medicated.

## 2017-03-29 NOTE — Progress Notes (Signed)
New Admission Note:  Arrival Method: Stretcher  Mental Orientation: Alert and oriented x 2-3  Telemetry: Box 04 Assessment: Completed Skin: Warm and dry. Incision site to lower back covered with dressing,CDI . Unstable to coccyx, Open blister to Rt inner thigh, Diaper abrasions to inner thighs, MSAD to groin and buttocks. BLE edematous 3-4 pitting  IV: NSL  Pain: Denies  Tubes: N/A Safety Measures: Safety Fall Prevention Plan was given, discussed and signed. Admission: Completed 2W Orientation: Patient has been orientated to the room, unit and the staff. Confusion noted.  Family: None   Orders have been reviewed and implemented. Will continue to monitor the patient. Call light has been placed within reach and bed alarm has been activated.   Sima Matas BSN, RN  Phone Number: [phone]

## 2017-03-29 NOTE — ED Notes (Signed)
Patient transported to CT 

## 2017-03-29 NOTE — Progress Notes (Signed)
Report received from [facility]. Room ready.

## 2017-03-29 NOTE — H&P (Signed)
[facility] Admission History and Physical Service Pager: [phone]  Patient name: Taylor Tyler record number: [id number] Date of birth: [birth year] Age: 76 y.o. Gender: female  Primary Care Provider: Lenard Simmer, MD Consultants: Neurosurgery Code Status: Full, unable to ask patient as she is altered  Chief Complaint:  Altered Mental Status  Assessment and Plan: MARCELL PFEIFER is a 76 y.o. female presenting with AMS, diarrhea and vomiting, also planning for spinal procedure in OR with neurosurgery 8/27. PMH is significant for T2DM, CKD 3, HTN, HLD, chronic anemia, hypothyroidism, HLD, and spinal stenosis s/p neurosurgery operations in 01/2017 and 0/8144   AMS - Uncertain etiology. Broad differential includes infection (no leukocytosis, VSS, afebrile but with diarrhea, vomiting and an open back wound), vs iatrogenic given multiple sedating meds on board, vs uremia (BUN 84, Cr 2.84). Intracranial process such as stroke considered (CT head negative for acute process, MRI brain pending, unable to complete neuro exam due to AMS). Hypoglycemia less likely, serum glucose 118 on admission bmp. Intoxication possible though seems very unlikely. Consider Hypercarbia given lethargy. Severe dehydration with diarrhea likely contributing. Low on the differential is thyroid dysfunction with last TSH low at 0.140 on 7/24, worth rechecking. - admit FPTS attending Dr. Nori Riis - monitor on telemetry - check ABG - MR brain WO contrast - Blood cultures x2, CXR, CSF (if possible with back wound?), Urine cultures - UDS, EtOH - check TSH - start IV vanc/zosyn dosed per pharm after cultures are collected - D5NS @100  ml/hr - NPO - limit sedating meds - q4H neuro checks - CBG monitoring TID/AC  Open back wound s/p neurosurgery - s/p L3-4 and L4-5 laminectomies with L4-5 Gill procedure, L5-S1 laminectomy with facetectomy, posterior segmental instrumented fusion L3-S1 on  02/26/2017. On admission, open lumbar back wound with packing with clean dressing, no external indications of local infection.  - Spoke with neurosurgery: patient for planned procedure with washout tomorrow 8/27 by Dr. Cyndy Freeze with Neurosurgery - keep NPO - will follow up neurosurg recs  Diarrhea/vomiting - uncertain severity because patient cannot give a history, however suspect many episodes given AKI and signs of dehydration on labs. C diff negative in ED. May be from doxycycline vs other GI pathogen. Lipase mildly elevated at 72, however not likely pancreatitis given pancreas normal on CT abdomen and no abdominal tenderness to palpation. CT abdomen without acute abnormality. - hold doxycycline (IV abx as above) - IVF as above - Zofran PRN nausea - enteric precautions  AKI on CKD III - Cr 2.87 with BUN 87, from baseline 1.4-1.8. Likely prerenal given recent diarrhea/vomiting/inability to tolerate PO - IVF as above - monitor AM creatinine - holding nephrotoxic agents (home Lasix, HCTZ)  HTN - BP elevated at 181/54 on admission. Home regimen includes coreg  25 mg BID, Clonidine 0.2 mg BID, lasix 20 mg daily, losartan 100 mg daily, hctz 25 mg daily - patient is allergic to hydralazine - hold losartan, lasix, and HCTZ given acute kidney injury - have to hold coreg and clonidine due to NPO for AMS - will initiate IV metoprolol 5 mg q6H, may need to titrate up or consider gtt for BP control while NPO/altered  LE edema, chronic - uncertain etiology. 3+ bil edema on admission. Patient had echo 01/16/2017 which showed EF 55-60%, moderate LVH, no regional wall motion abnormalities. Albumin low at 2.4 which may contribute. Takes PO Lasix 20 mg daily at home. - holding lasix for AKI and NPO - monitor  T2DM - well controlled; last A1c 5.7 on 02/16/2017. Home regimen is metformin 500 mg BID.  - hold home metformin with AKI and AMS - SSI  Anemia, chronic - Hgb on admission 7.8 from 9-10 baseline, though  has been low at 7.8-8.0 for the last month after spinal surgery. CKD likely contributes. Home regimen includes Ferrous sulfate 325 mg daily. - hold home iron in setting of possible acute infection - monitor AM CBC  Hypothyroidism - Last TSH low at 0.140 on 7/24 and so levothyroxine 88 mcg qdaily was increased to 100 mcg daily by 8/1 note. Not exhibiting signs of hyperthyroidism. - rechecking TSH given AMS - unless TSH is significantly different, plan to continue 100 mcg daily given this dose was adjusted <1 month ago  HLD - last lipid panel apparently not available in epic. Home regimen Atorva 40 mg QHS.  - continue statin  FEN/GI: NPO, D5NS @100cc /hr Prophylaxis: heparin given kidney injury and back procedure planned for 8/27  Disposition: Telemetry. Plan for multiple days of admission for workup and therapy for AMS.  History of Present Illness:  Taylor Tyler is a 76 y.o. female presenting with altered mental status. Son is at bedside and he gives most of the history. Patient was in her usual state of health until one month ago n 7/26 when she underwent spinal procedure. She was discharged to SNF and has had intermittent altered mental status over the last month. Her son notes that she has had altered mentation with opioid pain medication in the past, and he feels her altered mentation has been worse with opioids. At her baseline she is independent at home, completes all ADL's and drives. She is lethargic and oriented to only the year 2018 on admission, and her son says this has been going on for nearly a month with intermittent periods of improved lucidness.   She has been having vomiting since last night per her son. She has been having diarrhea but he is uncertain when this began. Patient is unable to verbalize whether she has any pain, urinary symptoms, difficulty breathing, fevers, or URI symptoms.   Her son at bedside notes that she had a Lumbar procedure in late July and she is meant to  have a wound vac placed tomorrow by neurosurgery.  Review Of Systems: Per HPI.  ROS  Patient Active Problem List   Diagnosis Date Noted  . Anemia 03/14/2017  . Acute blood loss as cause of postoperative anemia 03/14/2017  . Acute renal failure superimposed on stage 3 chronic kidney disease ([place]) 03/14/2017  . Bilateral lower extremity edema 03/04/2017  . AKI (acute kidney injury) ([place]) 02/28/2017  . Lethargy 02/28/2017  . Obesity, Class III, BMI 40-49.9 (morbid obesity) ([place]) 02/27/2017  . DM (diabetes mellitus), type 2 with renal complications ([place]) 50/27/7412  . Status post lumbar surgery 02/24/2017  . Hypertensive emergency 01/15/2017  . CKD (chronic kidney disease) stage 3, GFR 30-59 ml/min 01/15/2017  . Hypothyroidism 01/15/2017  . Diverticulosis 01/15/2017  . HLD (hyperlipidemia) 01/15/2017  . Spinal stenosis of lumbar region 01/15/2017  . Low back pain 09/12/2016  . Chest pain 09/05/2016  . Accelerated hypertension 07/06/2016  . Disease of pancreas 07/21/2012    Past Medical History: Past Medical History:  Diagnosis Date  . Anemia   . Arthritis   . Chronic kidney disease    ?? renal insufficiency,   . Diabetes mellitus without complication ([place])    diagnosed 4-5 yrs ago  . Disease of pancreas   .  DJD (degenerative joint disease)   . Hypertension   . Hypothyroidism   . Wound healing, delayed    back    Past Surgical History: Past Surgical History:  Procedure Laterality Date  . ABDOMINAL EXPOSURE N/A 02/24/2017   Procedure: ABDOMINAL EXPOSURE;  Surgeon: Angelia Mould, MD;  Location: [facility];  Service: Vascular;  Laterality: N/A;  . ABDOMINAL HYSTERECTOMY  1987  . ANTERIOR LAT LUMBAR FUSION N/A 02/24/2017   Procedure: Lumbar three- five Anterior lateral lumbar interbody fusion;  Surgeon: Ditty, Kevan Ny, MD;  Location: [facility];  Service: Neurosurgery;  Laterality: N/A;  L3-5 Anterior lateral lumbar interbody fusion with removal of coflex at L3-4, L4-5   . ANTERIOR LUMBAR FUSION N/A 02/24/2017   Procedure: Stage 1: Lumbar five-Sacral one Anterior lumbar interbody fusion;  Surgeon: Ditty, Kevan Ny, MD;  Location: [facility];  Service: Neurosurgery;  Laterality: N/A;  Stage 1: L5-S1 Anterior lumbar interbody fusion  . APPLICATION OF ROBOTIC ASSISTANCE FOR SPINAL PROCEDURE N/A 02/26/2017   Procedure: APPLICATION OF ROBOTIC ASSISTANCE FOR SPINAL PROCEDURE;  Surgeon: Ditty, Kevan Ny, MD;  Location: [facility];  Service: Neurosurgery;  Laterality: N/A;  . BACK SURGERY  2013  . BREAST SURGERY  1992   Breast Reduction  . CHOLECYSTECTOMY  1982  . COLON SURGERY  2015  . COLON SURGERY    . EYE SURGERY Right 2017   cataracts  . [place]  . LUMBAR FUSION  02/24/2017  . LUMBAR LAMINECTOMY WITH SPINOUS PROCESS PLATE 2 LEVEL N/A 2/37/6283   Procedure: LUMBAR LAMINECTOMY/DECOMPRESSION MICRODISCECTOMY CoFlex;  Surgeon: Faythe Ghee, MD;  Location: [facility];  Service: Neurosurgery;  Laterality: N/A;  Lumbar three-four,Lumbar Four-Five Laminectomy with Coflex    Social History: Social History  Substance Use Topics  . Smoking status: Never Smoker  . Smokeless tobacco: Never Used  . Alcohol use No    Please also refer to relevant sections of EMR.  Family History: Family History  Problem Relation Age of Onset  . Diabetes Father   . Heart attack Father   . Hypertension Father   . Diabetes Mother   . Hypertension Mother     Allergies and Medications: Allergies  Allergen Reactions  . Clams [Shellfish Allergy] Swelling and Other (See Comments)    THROAT SWELLS NECK TURNS RED  . Hydralazine Other (See Comments)    CHEST TIGHTNESS   No current facility-administered medications on file prior to encounter.    Current Outpatient Prescriptions on File Prior to Encounter  Medication Sig Dispense Refill  . atorvastatin (LIPITOR) 40 MG tablet Take 40 mg by mouth every evening.    . carvedilol (COREG) 25 MG tablet Take 25 mg  by mouth 2 (two) times daily.     . cloNIDine (CATAPRES) 0.2 MG tablet Take 0.2 mg by mouth 2 (two) times daily.    . cyclobenzaprine (FLEXERIL) 5 MG tablet Take 1 tablet (5 mg total) by mouth 3 (three) times daily as needed for muscle spasms. 30 tablet 0  . ergocalciferol (VITAMIN D2) 50000 UNITS capsule Take 50,000 Units by mouth once a week. On Tuesdays    . ferrous sulfate 325 (65 FE) MG tablet Take 325 mg by mouth daily with breakfast.    . furosemide (LASIX) 20 MG tablet Take 20 mg by mouth daily.     . hydrochlorothiazide (HYDRODIURIL) 25 MG tablet Take 25 mg by mouth daily.     Marland Kitchen levothyroxine (SYNTHROID, LEVOTHROID) 88 MCG tablet Take 88 mcg  by mouth daily before breakfast.     . losartan (COZAAR) 100 MG tablet Take 100 mg by mouth daily after breakfast.     . Magnesium Oxide 250 MG TABS Take 250 mg by mouth daily.    . metFORMIN (GLUCOPHAGE) 500 MG tablet Take 500 mg by mouth 2 (two) times daily.     Marland Kitchen Propylene Glycol-Glycerin (SOOTHE) 0.6-0.6 % SOLN Place 1 drop into both eyes daily as needed (for dry/gritty eyes.).     Marland Kitchen oxyCODONE-acetaminophen (PERCOCET/ROXICET) 5-325 MG tablet Take 1 tablet by mouth every 6 (six) hours as needed for severe pain. (Patient not taking: Reported on 03/29/2017) 45 tablet 0    Objective: BP (!) 181/54   Pulse (!) 59   Temp 98.4 F (36.9 C) (Rectal)   Resp 20   Ht 5\' 3"  (1.6 m)   Wt 217 lb (98.4 kg)   SpO2 100%   BMI 38.44 kg/m  Exam: General: Lethargic female opens eyes and mouth in response to command, otherwise unable to coherently answer questions or respond to commands. Not in acute distress. Keeps going back to sleep and mumbling to answer. Eyes: PERRL, EOMI, no scleral icterus or injection ENTM: MMM, no pharyngeal erythema or exudate Neck: no LAD Cardiovascular: RRR, no m/r/g, 3+ pitting edema in LE bilaterally Respiratory: poor effort but no W/R/R Gastrointestinal: soft, nontender, no rebound or guarding, no hepatosplenomegaly,  nondistended MSK: voluntary movement in 4 extremities Derm: no rashes or lesions  Neuro: CN II, III, IV, VI, VIII intact. Unable to fully assess CN V, VII, IX, X, XI, or XII due to poor patient cooparation/lethargy. She does move 4 extremities voluntarily. Psych: AAOx1. Alert to the year. Place: states we are at home. Self: re-states the year. President: restates the year. Disoriented to situation. Keeps falling back to sleep. Responds to some commands on exam.  Labs and Imaging: CBC BMET   Recent Labs Lab 03/29/17 1323  WBC 6.9  HGB 7.8*  HCT 24.8*  PLT 302    Recent Labs Lab 03/29/17 1323  NA 137  K 4.3  CL 105  CO2 21*  BUN 87*  CREATININE 2.87*  GLUCOSE 118*  CALCIUM 8.7*     Ct Abdomen Pelvis Wo Contrast 03/29/2017 FINDINGS:  Lower chest: There is stable subpleural lipoma at the right lung base on image 8. The lung bases are otherwise clear. No significant pleural or pericardial effusion. The heart is mildly enlarged.  Hepatobiliary: Progressive diffuse hepatic steatosis. No focal abnormalities are seen on noncontrast imaging. No significant biliary dilatation status post cholecystectomy.  Pancreas: Unremarkable. No pancreatic ductal dilatation or surrounding inflammatory changes.  Spleen: Normal in size without focal abnormality.  Adrenals/Urinary Tract: Both adrenal glands appear normal. Stable small nonobstructing calculus in the lower pole of the left kidney. No other urinary tract calculi are demonstrated. There is stable mild renal cortical thinning bilaterally. Chronic perinephric soft tissue stranding has improved. There is no hydronephrosis. The bladder appears unremarkable.  Stomach/Bowel: No evidence of bowel wall thickening, distention or surrounding inflammatory change. The distal colonic anastomosis appears patent status post partial colectomy. There are diverticular changes throughout the colon. Broad-based ventral hernia containing transverse colon and  multiple loops of small bowel is unchanged. No evidence of incarceration or obstruction.  Vascular/Lymphatic: There are no enlarged abdominal or pelvic lymph nodes. Aortic and branch vessel atherosclerosis noted. Reproductive: Hysterectomy. No evidence of adnexal mass. Probable pelvic floor laxity.  Other: As above, large broad-based ventral hernia, grossly stable. No ascites  or free air. Musculoskeletal: No acute or significant osseous findings. Interval revision of lower lumbar fusion. The patient is now fused from L3 through S1. The hardware appears intact. There is an open midline wound superiorly with apparent packing material and surrounding soft tissue emphysema. There is no large paraspinal fluid collection. No bone destruction identified.  IMPRESSION:  1. No acute abdominopelvic findings identified.  2. Stable large ventral hernia without evidence of incarceration or obstruction.  3. Interval revision of lumbar fusion. There is an open wound containing apparent packing material.  4. Diffuse hepatic steatosis.  5.  Aortic Atherosclerosis (ICD10-I70.0).  Ct Head Wo Contrast 03/29/2017 FINDINGS: Brain: No subdural, epidural, or subarachnoid hemorrhage identified on today's study. Cerebellum, brainstem, and basal cisterns are within normal limits. No mass effect or midline shift. Ventricles and sulci are unremarkable. No acute cortical ischemia or infarct. Vascular: Calcified atherosclerosis is seen in the intracranial carotid arteries. Skull: Normal. Negative for fracture or focal lesion. Sinuses/Orbits: There is a small amount of fluid or mucus in the sphenoid sinuses. This is similar compared to the previous study. Paranasal sinuses, mastoid air cells, and middle ears are otherwise normal. Other: There is a stable subcutaneous nodule in the posterior scalp on series 3, image 9 of no acute significance. Extracranial soft tissues are otherwise normal.  IMPRESSION:  1. No acute intracranial  abnormality identified.  2. Mild sinus disease as above.   Everrett Coombe, MD 03/29/2017, 5:09 PM PGY-2, [place] Intern pager: [phone], text pages welcome

## 2017-03-30 ENCOUNTER — Inpatient Hospital Stay (HOSPITAL_COMMUNITY): Admission: RE | Admit: 2017-03-30 | Payer: Medicare Other | Source: Ambulatory Visit | Admitting: Neurological Surgery

## 2017-03-30 ENCOUNTER — Encounter (HOSPITAL_COMMUNITY): Payer: Self-pay | Admitting: Certified Registered"

## 2017-03-30 ENCOUNTER — Other Ambulatory Visit: Payer: Self-pay

## 2017-03-30 ENCOUNTER — Inpatient Hospital Stay (HOSPITAL_COMMUNITY): Payer: Medicare Other | Admitting: Emergency Medicine

## 2017-03-30 ENCOUNTER — Encounter (HOSPITAL_COMMUNITY)
Admission: EM | Disposition: A | Discharge status: Skilled Nursing Facility | Payer: Self-pay | Source: Home / Self Care | Attending: Family Medicine

## 2017-03-30 ENCOUNTER — Inpatient Hospital Stay (HOSPITAL_COMMUNITY): Payer: Medicare Other

## 2017-03-30 DIAGNOSIS — L8990 Pressure ulcer of unspecified site, unspecified stage: Secondary | ICD-10-CM | POA: Insufficient documentation

## 2017-03-30 DIAGNOSIS — N179 Acute kidney failure, unspecified: Secondary | ICD-10-CM

## 2017-03-30 DIAGNOSIS — R197 Diarrhea, unspecified: Secondary | ICD-10-CM

## 2017-03-30 DIAGNOSIS — T8130XA Disruption of wound, unspecified, initial encounter: Secondary | ICD-10-CM | POA: Insufficient documentation

## 2017-03-30 HISTORY — DX: Other injury of unspecified body region, subsequent encounter: T14.8XXD

## 2017-03-30 HISTORY — PX: APPLICATION OF WOUND VAC: SHX5189

## 2017-03-30 HISTORY — PX: LUMBAR WOUND DEBRIDEMENT: SHX1988

## 2017-03-30 LAB — GLUCOSE, CAPILLARY
GLUCOSE-CAPILLARY: 105 mg/dL — AB (ref 65–99)
GLUCOSE-CAPILLARY: 108 mg/dL — AB (ref 65–99)
GLUCOSE-CAPILLARY: 156 mg/dL — AB (ref 65–99)
Glucose-Capillary: 132 mg/dL — ABNORMAL HIGH (ref 65–99)
Glucose-Capillary: 133 mg/dL — ABNORMAL HIGH (ref 65–99)
Glucose-Capillary: 136 mg/dL — ABNORMAL HIGH (ref 65–99)
Glucose-Capillary: 137 mg/dL — ABNORMAL HIGH (ref 65–99)
Glucose-Capillary: 145 mg/dL — ABNORMAL HIGH (ref 65–99)
Glucose-Capillary: 167 mg/dL — ABNORMAL HIGH (ref 65–99)

## 2017-03-30 LAB — COMPREHENSIVE METABOLIC PANEL
ALBUMIN: 2.1 g/dL — AB (ref 3.5–5.0)
ALT: 18 U/L (ref 14–54)
AST: 29 U/L (ref 15–41)
Alkaline Phosphatase: 82 U/L (ref 38–126)
Anion gap: 7 (ref 5–15)
BILIRUBIN TOTAL: 0.6 mg/dL (ref 0.3–1.2)
BUN: 63 mg/dL — AB (ref 6–20)
CHLORIDE: 115 mmol/L — AB (ref 101–111)
CO2: 22 mmol/L (ref 22–32)
CREATININE: 2.31 mg/dL — AB (ref 0.44–1.00)
Calcium: 8.3 mg/dL — ABNORMAL LOW (ref 8.9–10.3)
GFR calc Af Amer: 23 mL/min — ABNORMAL LOW (ref >60)
GFR, EST NON AFRICAN AMERICAN: 20 mL/min — AB (ref >60)
GLUCOSE: 155 mg/dL — AB (ref 65–99)
POTASSIUM: 3.8 mmol/L (ref 3.5–5.1)
Sodium: 144 mmol/L (ref 135–145)
Total Protein: 5.8 g/dL — ABNORMAL LOW (ref 6.5–8.1)

## 2017-03-30 LAB — CBC
HEMATOCRIT: 23.2 % — AB (ref 36.0–46.0)
Hemoglobin: 7.2 g/dL — ABNORMAL LOW (ref 12.0–15.0)
MCH: 26.9 pg (ref 26.0–34.0)
MCHC: 31 g/dL (ref 30.0–36.0)
MCV: 86.6 fL (ref 78.0–100.0)
PLATELETS: 278 10*3/uL (ref 150–400)
RBC: 2.68 MIL/uL — ABNORMAL LOW (ref 3.87–5.11)
RDW: 14.5 % (ref 11.5–15.5)
WBC: 9.4 10*3/uL (ref 4.0–10.5)

## 2017-03-30 LAB — SURGICAL PCR SCREEN
MRSA, PCR: NEGATIVE
Staphylococcus aureus: NEGATIVE

## 2017-03-30 LAB — RAPID URINE DRUG SCREEN, HOSP PERFORMED
AMPHETAMINES: NOT DETECTED
BARBITURATES: NOT DETECTED
Benzodiazepines: NOT DETECTED
Cocaine: NOT DETECTED
Opiates: NOT DETECTED
Tetrahydrocannabinol: NOT DETECTED

## 2017-03-30 SURGERY — LUMBAR WOUND DEBRIDEMENT
Anesthesia: General | Site: Back

## 2017-03-30 MED ORDER — SENNA 8.6 MG PO TABS
1.0000 | ORAL_TABLET | Freq: Two times a day (BID) | ORAL | Status: DC
  Administered 2017-03-30 – 2017-04-06 (×11): 8.6 mg via ORAL
  Filled 2017-03-30 (×14): qty 1

## 2017-03-30 MED ORDER — LIDOCAINE 2% (20 MG/ML) 5 ML SYRINGE
INTRAMUSCULAR | Status: DC | PRN
  Administered 2017-03-30: 60 mg via INTRAVENOUS

## 2017-03-30 MED ORDER — ONDANSETRON HCL 4 MG PO TABS: 4.0000 mg | ORAL_TABLET | Freq: Four times a day (QID) | ORAL | Status: DC | PRN

## 2017-03-30 MED ORDER — MIDAZOLAM HCL 2 MG/2ML IJ SOLN
INTRAMUSCULAR | Status: AC
  Filled 2017-03-30: qty 2

## 2017-03-30 MED ORDER — ROCURONIUM BROMIDE 10 MG/ML (PF) SYRINGE
PREFILLED_SYRINGE | INTRAVENOUS | Status: AC
  Filled 2017-03-30: qty 5

## 2017-03-30 MED ORDER — 0.9 % SODIUM CHLORIDE (POUR BTL) OPTIME
TOPICAL | Status: DC | PRN
  Administered 2017-03-30: 1000 mL

## 2017-03-30 MED ORDER — OXYCODONE HCL 5 MG/5ML PO SOLN: 5.0000 mg | Freq: Once | ORAL | Status: DC | PRN

## 2017-03-30 MED ORDER — DEXAMETHASONE SODIUM PHOSPHATE 10 MG/ML IJ SOLN
INTRAMUSCULAR | Status: DC | PRN
  Administered 2017-03-30: 4 mg via INTRAVENOUS

## 2017-03-30 MED ORDER — SUGAMMADEX SODIUM 200 MG/2ML IV SOLN
INTRAVENOUS | Status: AC
  Filled 2017-03-30: qty 2

## 2017-03-30 MED ORDER — MIDAZOLAM HCL 5 MG/5ML IJ SOLN
INTRAMUSCULAR | Status: DC | PRN
  Administered 2017-03-30: 1 mg via INTRAVENOUS

## 2017-03-30 MED ORDER — SODIUM CHLORIDE 0.9 % IJ SOLN
INTRAMUSCULAR | Status: AC
  Filled 2017-03-30: qty 10

## 2017-03-30 MED ORDER — CHLORHEXIDINE GLUCONATE CLOTH 2 % EX PADS
6.0000 | MEDICATED_PAD | Freq: Once | CUTANEOUS | Status: AC
  Administered 2017-03-30: 6 via TOPICAL

## 2017-03-30 MED ORDER — PROPOFOL 10 MG/ML IV BOLUS
INTRAVENOUS | Status: AC
  Filled 2017-03-30: qty 20

## 2017-03-30 MED ORDER — PROPOFOL 10 MG/ML IV BOLUS
INTRAVENOUS | Status: DC | PRN
  Administered 2017-03-30: 100 mg via INTRAVENOUS

## 2017-03-30 MED ORDER — VANCOMYCIN HCL 1000 MG IV SOLR
INTRAVENOUS | Status: DC | PRN
  Administered 2017-03-30: 1000 mg via TOPICAL

## 2017-03-30 MED ORDER — SODIUM CHLORIDE 0.9 % IV SOLN: 250.0000 mL | INTRAVENOUS | Status: DC

## 2017-03-30 MED ORDER — HYDRALAZINE HCL 20 MG/ML IJ SOLN
10.0000 mg | Freq: Once | INTRAMUSCULAR | Status: AC
  Administered 2017-03-30: 10 mg via INTRAVENOUS

## 2017-03-30 MED ORDER — COLLAGENASE 250 UNIT/GM EX OINT
TOPICAL_OINTMENT | Freq: Every day | CUTANEOUS | Status: DC
  Administered 2017-03-31 – 2017-04-06 (×6): via TOPICAL
  Filled 2017-03-30: qty 30

## 2017-03-30 MED ORDER — CHLORHEXIDINE GLUCONATE CLOTH 2 % EX PADS: 6.0000 | MEDICATED_PAD | Freq: Once | CUTANEOUS | Status: DC

## 2017-03-30 MED ORDER — PHENOL 1.4 % MT LIQD: 1.0000 | OROMUCOSAL | Status: DC | PRN

## 2017-03-30 MED ORDER — DIAZEPAM 5 MG PO TABS: 5.0000 mg | ORAL_TABLET | Freq: Four times a day (QID) | ORAL | Status: DC | PRN

## 2017-03-30 MED ORDER — CELECOXIB 200 MG PO CAPS
200.0000 mg | ORAL_CAPSULE | Freq: Two times a day (BID) | ORAL | Status: DC
  Administered 2017-03-30: 200 mg via ORAL
  Filled 2017-03-30: qty 1

## 2017-03-30 MED ORDER — THROMBIN 5000 UNITS EX SOLR
OROMUCOSAL | Status: DC | PRN
  Administered 2017-03-30: 5 mL via TOPICAL

## 2017-03-30 MED ORDER — METHYLPREDNISOLONE ACETATE 80 MG/ML IJ SUSP
INTRAMUSCULAR | Status: AC
  Filled 2017-03-30: qty 1

## 2017-03-30 MED ORDER — BUPIVACAINE HCL (PF) 0.5 % IJ SOLN
INTRAMUSCULAR | Status: AC
  Filled 2017-03-30: qty 30

## 2017-03-30 MED ORDER — METHOCARBAMOL 750 MG PO TABS
750.0000 mg | ORAL_TABLET | Freq: Four times a day (QID) | ORAL | Status: DC
  Administered 2017-03-30 – 2017-03-31 (×5): 750 mg via ORAL
  Filled 2017-03-30 (×8): qty 1

## 2017-03-30 MED ORDER — SUFENTANIL CITRATE 50 MCG/ML IV SOLN
INTRAVENOUS | Status: DC | PRN
  Administered 2017-03-30: 15 ug via INTRAVENOUS
  Administered 2017-03-30: 10 ug via INTRAVENOUS

## 2017-03-30 MED ORDER — THROMBIN 5000 UNITS EX SOLR
CUTANEOUS | Status: AC
  Filled 2017-03-30: qty 10000

## 2017-03-30 MED ORDER — ROCURONIUM BROMIDE 10 MG/ML (PF) SYRINGE
PREFILLED_SYRINGE | INTRAVENOUS | Status: DC | PRN
  Administered 2017-03-30: 60 mg via INTRAVENOUS

## 2017-03-30 MED ORDER — FENTANYL CITRATE (PF) 100 MCG/2ML IJ SOLN: 25.0000 ug | INTRAMUSCULAR | Status: DC | PRN

## 2017-03-30 MED ORDER — OXYCODONE HCL 5 MG PO TABS
5.0000 mg | ORAL_TABLET | Freq: Four times a day (QID) | ORAL | Status: DC | PRN
  Administered 2017-03-30: 5 mg via ORAL
  Filled 2017-03-30: qty 1

## 2017-03-30 MED ORDER — ALUM & MAG HYDROXIDE-SIMETH 200-200-20 MG/5ML PO SUSP: 30.0000 mL | Freq: Four times a day (QID) | ORAL | Status: DC | PRN

## 2017-03-30 MED ORDER — LEVOTHYROXINE SODIUM 88 MCG PO TABS
88.0000 ug | ORAL_TABLET | Freq: Every day | ORAL | Status: DC
  Administered 2017-03-31 – 2017-04-07 (×8): 88 ug via ORAL
  Filled 2017-03-30 (×9): qty 1

## 2017-03-30 MED ORDER — ONDANSETRON HCL 4 MG/2ML IJ SOLN: 4.0000 mg | Freq: Once | INTRAMUSCULAR | Status: DC | PRN

## 2017-03-30 MED ORDER — FLEET ENEMA 7-19 GM/118ML RE ENEM: 1.0000 | ENEMA | Freq: Once | RECTAL | Status: DC | PRN

## 2017-03-30 MED ORDER — ONDANSETRON HCL 4 MG/2ML IJ SOLN: 4.0000 mg | Freq: Four times a day (QID) | INTRAMUSCULAR | Status: DC | PRN

## 2017-03-30 MED ORDER — MENTHOL 3 MG MT LOZG: 1.0000 | LOZENGE | OROMUCOSAL | Status: DC | PRN

## 2017-03-30 MED ORDER — HYDRALAZINE HCL 20 MG/ML IJ SOLN
INTRAMUSCULAR | Status: AC
  Filled 2017-03-30: qty 1

## 2017-03-30 MED ORDER — LIDOCAINE 2% (20 MG/ML) 5 ML SYRINGE
INTRAMUSCULAR | Status: AC
  Filled 2017-03-30: qty 5

## 2017-03-30 MED ORDER — CLONIDINE HCL 0.2 MG PO TABS
0.2000 mg | ORAL_TABLET | Freq: Two times a day (BID) | ORAL | Status: DC
  Administered 2017-03-30 – 2017-03-31 (×4): 0.2 mg via ORAL
  Filled 2017-03-30 (×4): qty 1

## 2017-03-30 MED ORDER — SUGAMMADEX SODIUM 200 MG/2ML IV SOLN
INTRAVENOUS | Status: DC | PRN
  Administered 2017-03-30: 200 mg via INTRAVENOUS

## 2017-03-30 MED ORDER — SUFENTANIL CITRATE 50 MCG/ML IV SOLN
INTRAVENOUS | Status: AC
  Filled 2017-03-30: qty 1

## 2017-03-30 MED ORDER — ACETAMINOPHEN 500 MG PO TABS
1000.0000 mg | ORAL_TABLET | Freq: Four times a day (QID) | ORAL | Status: DC
  Administered 2017-03-30 – 2017-03-31 (×3): 1000 mg via ORAL
  Filled 2017-03-30 (×3): qty 2

## 2017-03-30 MED ORDER — POTASSIUM CHLORIDE IN NACL 20-0.9 MEQ/L-% IV SOLN
100.0000 mL/h | INTRAVENOUS | Status: DC
  Administered 2017-03-30: 100 mL/h via INTRAVENOUS
  Filled 2017-03-30 (×2): qty 1000

## 2017-03-30 MED ORDER — CEFAZOLIN SODIUM-DEXTROSE 2-4 GM/100ML-% IV SOLN
2.0000 g | INTRAVENOUS | Status: AC
  Administered 2017-03-30: 2 g via INTRAVENOUS
  Filled 2017-03-30: qty 100

## 2017-03-30 MED ORDER — LIDOCAINE-EPINEPHRINE 2 %-1:100000 IJ SOLN
INTRAMUSCULAR | Status: AC
  Filled 2017-03-30: qty 1

## 2017-03-30 MED ORDER — LACTATED RINGERS IV SOLN
INTRAVENOUS | Status: DC
  Administered 2017-03-30 – 2017-04-01 (×2): via INTRAVENOUS

## 2017-03-30 MED ORDER — ONDANSETRON HCL 4 MG/2ML IJ SOLN
INTRAMUSCULAR | Status: AC
  Filled 2017-03-30: qty 2

## 2017-03-30 MED ORDER — BUPIVACAINE LIPOSOME 1.3 % IJ SUSP
20.0000 mL | INTRAMUSCULAR | Status: DC
  Filled 2017-03-30: qty 20

## 2017-03-30 MED ORDER — DEXAMETHASONE SODIUM PHOSPHATE 10 MG/ML IJ SOLN
INTRAMUSCULAR | Status: AC
  Filled 2017-03-30: qty 1

## 2017-03-30 MED ORDER — SODIUM CHLORIDE 0.9 % IJ SOLN
INTRAMUSCULAR | Status: DC | PRN
  Administered 2017-03-30: 10 mL

## 2017-03-30 MED ORDER — VANCOMYCIN HCL 1000 MG IV SOLR
INTRAVENOUS | Status: AC
  Filled 2017-03-30: qty 1000

## 2017-03-30 MED ORDER — OXYCODONE HCL 5 MG PO TABS: 5.0000 mg | ORAL_TABLET | Freq: Once | ORAL | Status: DC | PRN

## 2017-03-30 MED ORDER — ONDANSETRON HCL 4 MG/2ML IJ SOLN
INTRAMUSCULAR | Status: DC | PRN
Start: 2017-03-30 — End: 2017-03-30
  Administered 2017-03-30: 4 mg via INTRAVENOUS

## 2017-03-30 MED ORDER — SODIUM CHLORIDE 0.9% FLUSH
3.0000 mL | Freq: Two times a day (BID) | INTRAVENOUS | Status: DC
  Administered 2017-03-30 – 2017-04-07 (×10): 3 mL via INTRAVENOUS

## 2017-03-30 MED ORDER — MUPIROCIN 2 % EX OINT
TOPICAL_OINTMENT | CUTANEOUS | Status: AC
  Filled 2017-03-30: qty 22

## 2017-03-30 MED ORDER — SODIUM CHLORIDE 0.9% FLUSH: 3.0000 mL | INTRAVENOUS | Status: DC | PRN

## 2017-03-30 MED ORDER — GABAPENTIN 300 MG PO CAPS
300.0000 mg | ORAL_CAPSULE | Freq: Three times a day (TID) | ORAL | Status: DC
  Administered 2017-03-30 – 2017-04-02 (×10): 300 mg via ORAL
  Filled 2017-03-30 (×11): qty 1

## 2017-03-30 MED ORDER — THROMBIN 5000 UNITS EX SOLR
CUTANEOUS | Status: AC
  Filled 2017-03-30: qty 5000

## 2017-03-30 MED ORDER — KETOROLAC TROMETHAMINE 30 MG/ML IJ SOLN
INTRAMUSCULAR | Status: AC
  Filled 2017-03-30: qty 1

## 2017-03-30 MED ORDER — DOCUSATE SODIUM 100 MG PO CAPS
100.0000 mg | ORAL_CAPSULE | Freq: Two times a day (BID) | ORAL | Status: DC
  Administered 2017-03-31 – 2017-04-06 (×12): 100 mg via ORAL
  Filled 2017-03-30 (×16): qty 1

## 2017-03-30 MED ORDER — PANTOPRAZOLE SODIUM 40 MG IV SOLR
40.0000 mg | Freq: Every day | INTRAVENOUS | Status: DC
  Administered 2017-03-30 – 2017-04-02 (×4): 40 mg via INTRAVENOUS
  Filled 2017-03-30 (×4): qty 40

## 2017-03-30 MED ORDER — BUPIVACAINE-EPINEPHRINE (PF) 0.5% -1:200000 IJ SOLN
INTRAMUSCULAR | Status: AC
  Filled 2017-03-30: qty 30

## 2017-03-30 MED ORDER — THROMBIN 5000 UNITS EX SOLR
CUTANEOUS | Status: DC | PRN
  Administered 2017-03-30: 10000 [IU] via TOPICAL

## 2017-03-30 MED ORDER — BISACODYL 10 MG RE SUPP: 10.0000 mg | Freq: Every day | RECTAL | Status: DC | PRN

## 2017-03-30 MED ORDER — SODIUM CHLORIDE 0.9 % IR SOLN
Status: DC | PRN
  Administered 2017-03-30: 500 mL

## 2017-03-30 MED ORDER — SODIUM CHLORIDE 0.9 % IR SOLN
Freq: Once | Status: AC
  Administered 2017-03-30: 500 mL
  Filled 2017-03-30: qty 2

## 2017-03-30 SURGICAL SUPPLY — 76 items
BAG DECANTER FOR FLEXI CONT (MISCELLANEOUS) ×3 IMPLANT
BENZOIN TINCTURE PRP APPL 2/3 (GAUZE/BANDAGES/DRESSINGS) IMPLANT
BIT DRILL NEURO 2X3.1 SFT TUCH (MISCELLANEOUS) IMPLANT
BLADE CLIPPER SURG (BLADE) IMPLANT
BLADE SURG 11 STRL SS (BLADE) IMPLANT
BUR ROUND FLUTED 5 RND (BURR) IMPLANT
BUR ROUND FLUTED 5MM RND (BURR)
CANISTER SUCT 3000ML PPV (MISCELLANEOUS) ×3 IMPLANT
CANISTER WOUND CARE 500ML ATS (WOUND CARE) ×3 IMPLANT
CARTRIDGE OIL MAESTRO DRILL (MISCELLANEOUS) IMPLANT
CHLORAPREP W/TINT 26ML (MISCELLANEOUS) IMPLANT
CLOSURE WOUND 1/2 X4 (GAUZE/BANDAGES/DRESSINGS)
CONT SPEC 4OZ CLIKSEAL STRL BL (MISCELLANEOUS) ×9 IMPLANT
DECANTER SPIKE VIAL GLASS SM (MISCELLANEOUS) ×3 IMPLANT
DERMABOND ADVANCED (GAUZE/BANDAGES/DRESSINGS)
DERMABOND ADVANCED .7 DNX12 (GAUZE/BANDAGES/DRESSINGS) IMPLANT
DIFFUSER DRILL AIR PNEUMATIC (MISCELLANEOUS) IMPLANT
DRAPE LAPAROTOMY T 102X78X121 (DRAPES) IMPLANT
DRAPE MICROSCOPE LEICA (MISCELLANEOUS) IMPLANT
DRAPE POUCH INSTRU U-SHP 10X18 (DRAPES) ×3 IMPLANT
DRAPE SURG 17X23 STRL (DRAPES) ×3 IMPLANT
DRILL NEURO 2X3.1 SOFT TOUCH (MISCELLANEOUS)
DRSG VAC ATS LRG SENSATRAC (GAUZE/BANDAGES/DRESSINGS) ×3 IMPLANT
DRSG VAC ATS MED SENSATRAC (GAUZE/BANDAGES/DRESSINGS) IMPLANT
ELECT BLADE INSULATED 6.5IN (ELECTROSURGICAL) ×3
ELECT REM PT RETURN 9FT ADLT (ELECTROSURGICAL) ×3
ELECTRODE BLDE INSULATED 6.5IN (ELECTROSURGICAL) ×1 IMPLANT
ELECTRODE REM PT RTRN 9FT ADLT (ELECTROSURGICAL) ×1 IMPLANT
GAUZE SPONGE 4X4 12PLY STRL (GAUZE/BANDAGES/DRESSINGS) IMPLANT
GAUZE SPONGE 4X4 16PLY XRAY LF (GAUZE/BANDAGES/DRESSINGS) ×6 IMPLANT
GLOVE BIO SURGEON STRL SZ7 (GLOVE) ×6 IMPLANT
GLOVE BIOGEL PI IND STRL 7.0 (GLOVE) ×1 IMPLANT
GLOVE BIOGEL PI IND STRL 7.5 (GLOVE) ×2 IMPLANT
GLOVE BIOGEL PI INDICATOR 7.0 (GLOVE) ×2
GLOVE BIOGEL PI INDICATOR 7.5 (GLOVE) ×4
GLOVE SS BIOGEL STRL SZ 7.5 (GLOVE) ×4 IMPLANT
GLOVE SUPERSENSE BIOGEL SZ 7.5 (GLOVE) ×8
GOWN STRL REUS W/ TWL LRG LVL3 (GOWN DISPOSABLE) ×2 IMPLANT
GOWN STRL REUS W/ TWL XL LVL3 (GOWN DISPOSABLE) IMPLANT
GOWN STRL REUS W/TWL LRG LVL3 (GOWN DISPOSABLE) ×4
GOWN STRL REUS W/TWL XL LVL3 (GOWN DISPOSABLE)
HANDPIECE INTERPULSE COAX TIP (DISPOSABLE) ×2
HEMOSTAT POWDER KIT SURGIFOAM (HEMOSTASIS) ×3 IMPLANT
KIT BASIN OR (CUSTOM PROCEDURE TRAY) ×3 IMPLANT
KIT ROOM TURNOVER OR (KITS) ×3 IMPLANT
NEEDLE HYPO 18GX1.5 BLUNT FILL (NEEDLE) ×3 IMPLANT
NEEDLE HYPO 21X1.5 SAFETY (NEEDLE) ×3 IMPLANT
NEEDLE HYPO 25X1 1.5 SAFETY (NEEDLE) ×3 IMPLANT
NS IRRIG 1000ML POUR BTL (IV SOLUTION) ×3 IMPLANT
OIL CARTRIDGE MAESTRO DRILL (MISCELLANEOUS)
PACK LAMINECTOMY NEURO (CUSTOM PROCEDURE TRAY) ×3 IMPLANT
PACK UNIVERSAL I (CUSTOM PROCEDURE TRAY) ×3 IMPLANT
PAD ARMBOARD 7.5X6 YLW CONV (MISCELLANEOUS) ×9 IMPLANT
PATTIES SURGICAL .5X1.5 (GAUZE/BANDAGES/DRESSINGS) IMPLANT
RUBBERBAND STERILE (MISCELLANEOUS) IMPLANT
SET HNDPC FAN SPRY TIP SCT (DISPOSABLE) ×1 IMPLANT
SPONGE NEURO XRAY DETECT 1X3 (DISPOSABLE) IMPLANT
SPONGE SURGIFOAM ABS GEL SZ50 (HEMOSTASIS) ×3 IMPLANT
STAPLER VISISTAT (STAPLE) ×3 IMPLANT
STRIP CLOSURE SKIN 1/2X4 (GAUZE/BANDAGES/DRESSINGS) IMPLANT
SUT STRATAFIX MNCRL+ 3-0 PS-2 (SUTURE)
SUT STRATAFIX MONOCRYL 3-0 (SUTURE)
SUT VIC AB 0 CT1 18XCR BRD8 (SUTURE) IMPLANT
SUT VIC AB 0 CT1 8-18 (SUTURE)
SUT VIC AB 1 CT1 18XBRD ANBCTR (SUTURE) ×2 IMPLANT
SUT VIC AB 1 CT1 8-18 (SUTURE) ×4
SUT VIC AB 2-0 CT1 18 (SUTURE) IMPLANT
SUT VIC AB 4-0 PS2 27 (SUTURE) IMPLANT
SUTURE STRATFX MNCRL+ 3-0 PS-2 (SUTURE) IMPLANT
SYR 30ML LL (SYRINGE) ×6 IMPLANT
SYR 5ML LL (SYRINGE) IMPLANT
TOWEL GREEN STERILE (TOWEL DISPOSABLE) ×3 IMPLANT
TOWEL GREEN STERILE FF (TOWEL DISPOSABLE) ×3 IMPLANT
TUBE CONNECTING 12'X1/4 (SUCTIONS)
TUBE CONNECTING 12X1/4 (SUCTIONS) IMPLANT
WATER STERILE IRR 1000ML POUR (IV SOLUTION) ×3 IMPLANT

## 2017-03-30 NOTE — H&P (Signed)
CC:  Chief Complaint  Patient presents with  . Weakness    HPI: Taylor Tyler is a 76 y.o. female with lumbar wound dehiscence.  She was admitted yesterday for altered mental status but she was already on the surgery schedule for today.  PMH: Past Medical History:  Diagnosis Date  . Anemia   . Arthritis   . Chronic kidney disease    ?? renal insufficiency,   . Diabetes mellitus without complication ([place])    diagnosed 4-5 yrs ago  . Disease of pancreas   . DJD (degenerative joint disease)   . Hypertension   . Hypothyroidism   . Wound healing, delayed    back    PSH: Past Surgical History:  Procedure Laterality Date  . ABDOMINAL EXPOSURE N/A 02/24/2017   Procedure: ABDOMINAL EXPOSURE;  Surgeon: Angelia Mould, MD;  Location: [facility];  Service: Vascular;  Laterality: N/A;  . ABDOMINAL HYSTERECTOMY  1987  . ANTERIOR LAT LUMBAR FUSION N/A 02/24/2017   Procedure: Lumbar three- five Anterior lateral lumbar interbody fusion;  Surgeon: Kortland Nichols, Kevan Ny, MD;  Location: [facility];  Service: Neurosurgery;  Laterality: N/A;  L3-5 Anterior lateral lumbar interbody fusion with removal of coflex at L3-4, L4-5  . ANTERIOR LUMBAR FUSION N/A 02/24/2017   Procedure: Stage 1: Lumbar five-Sacral one Anterior lumbar interbody fusion;  Surgeon: Xylina Rhoads, Kevan Ny, MD;  Location: [facility];  Service: Neurosurgery;  Laterality: N/A;  Stage 1: L5-S1 Anterior lumbar interbody fusion  . APPLICATION OF ROBOTIC ASSISTANCE FOR SPINAL PROCEDURE N/A 02/26/2017   Procedure: APPLICATION OF ROBOTIC ASSISTANCE FOR SPINAL PROCEDURE;  Surgeon: Neshia Mckenzie, Kevan Ny, MD;  Location[facility];  Service: Neurosurgery;  Laterality: N/A;  . BACK SURGERY  2013  . BREAST SURGERY  1992   Breast Reduction  . CHOLECYSTECTOMY  1982  . COLON SURGERY  2015  . COLON SURGERY    . EYE SURGERY Right 2017   cataracts  . [place]  . LUMBAR FUSION  02/24/2017  . LUMBAR LAMINECTOMY WITH SPINOUS PROCESS PLATE 2  LEVEL N/A 03/17/4817   Procedure: LUMBAR LAMINECTOMY/DECOMPRESSION MICRODISCECTOMY CoFlex;  Surgeon: Faythe Ghee, MD;  [facility]: [facility];  Service: Neurosurgery;  Laterality: N/A;  Lumbar three-four,Lumbar Four-Five Laminectomy with Coflex    SH: Social History  Substance Use Topics  . Smoking status: Never Smoker  . Smokeless tobacco: Never Used  . Alcohol use No    MEDS: Prior to Admission medications   Medication Sig Start Date End Date Taking? Authorizing Provider  acetaminophen (TYLENOL) 325 MG tablet Take 650 mg by mouth every 6 (six) hours as needed for mild pain.   Yes [provider]  atorvastatin (LIPITOR) 40 MG tablet Take 40 mg by mouth every evening.   Yes [provider]  carvedilol (COREG) 25 MG tablet Take 25 mg by mouth 2 (two) times daily.  11/20/15  Yes [provider]  cloNIDine (CATAPRES) 0.2 MG tablet Take 0.2 mg by mouth 2 (two) times daily.   Yes [provider]  cyclobenzaprine (FLEXERIL) 5 MG tablet Take 1 tablet (5 mg total) by mouth 3 (three) times daily as needed for muscle spasms. 03/03/17  Yes Costella, Vista Mink, PA-C  doxycycline (VIBRA-TABS) 100 MG tablet Take 100 mg by mouth 2 (two) times daily. indefinitely for surgical wound infection to back 03/27/17  Yes [provider]  ergocalciferol (VITAMIN D2) 50000 UNITS capsule Take 50,000 Units by mouth once a week. On Tuesdays  Yes [provider]  ferrous sulfate 325 (65 FE) MG tablet Take 325 mg by mouth daily with breakfast.   Yes [provider]  furosemide (LASIX) 20 MG tablet Take 20 mg by mouth daily.  12/30/16  Yes [provider]  hydrochlorothiazide (HYDRODIURIL) 25 MG tablet Take 25 mg by mouth daily.  12/20/16  Yes [provider]  levothyroxine (SYNTHROID, LEVOTHROID) 88 MCG tablet Take 88 mcg by mouth daily before breakfast.  11/20/15  Yes [provider]  losartan (COZAAR) 100 MG tablet Take 100 mg by  mouth daily after breakfast.  11/20/15  Yes [provider]  Magnesium Oxide 250 MG TABS Take 250 mg by mouth daily.   Yes [provider]  metFORMIN (GLUCOPHAGE) 500 MG tablet Take 500 mg by mouth 2 (two) times daily.    Yes [provider]  oxyCODONE-acetaminophen (PERCOCET) 7.5-325 MG tablet Take 1 tablet by mouth every 6 (six) hours as needed for severe pain.   Yes [provider]  Propylene Glycol-Glycerin (SOOTHE) 0.6-0.6 % SOLN Place 1 drop into both eyes daily as needed (for dry/gritty eyes.).    Yes [provider]  oxyCODONE-acetaminophen (PERCOCET/ROXICET) 5-325 MG tablet Take 1 tablet by mouth every 6 (six) hours as needed for severe pain. Patient not taking: Reported on 03/29/2017 03/03/17   Traci Sermon, PA-C    ALLERGY: Allergies  Allergen Reactions  . Clams [Shellfish Allergy] Swelling and Other (See Comments)    THROAT SWELLS NECK TURNS RED  . Hydralazine Other (See Comments)    CHEST TIGHTNESS    ROS: ROS  NEUROLOGIC EXAM: Awake, alert, oriented Memory and concentration grossly intact Speech fluent, appropriate CN grossly intact Motor exam: Upper Extremities Deltoid Bicep Tricep Grip  Right 5/5 5/5 5/5 5/5  Left 5/5 5/5 5/5 5/5   Lower Extremity IP Quad PF DF EHL  Right 5/5 5/5 5/5 5/5 5/5  Left 5/5 5/5 5/5 5/5 5/5   Sensation grossly intact to LT   IMPRESSION: - 76 y.o. female with lumbar wound dehiscence.  She does not appear to have an infection.  PLAN: - Wound exploration, irrigation and debridement, placement of wound vac.

## 2017-03-30 NOTE — Consult Note (Addendum)
[facility] Nurse wound consult note Reason for Consult: Consult requested for back Vac and sacrum/buttocks wounds. Pt just had surgery for application of a Vac dressing to middle back wound; intact with good seal at 12mm cont suction, small amt pink drainage in the cannister. WOC will plan to change dressing on Wed if requested by neurosurgical team. Wound type: Sacrum with unstageable pressure injury; 2X1cm, 100% tightly adhered yellow slough, scant amt yellow drainage, no odor. Bilat buttocks with patchy areas of pink moist partial thickness skin loss surrounding; appearance consistent with moisture associated skin damage. Pressure Injury POA: Yes Dressing procedure/placement/frequency: Santyl ointment to provide enzymatic debridement of nonviable tissue to sacrum.  Barrier cream to promote healing to bilat buttocks. Julien Girt MSN, RN, [place], [place], [place]

## 2017-03-30 NOTE — Care Management Note (Signed)
Case Management Note  Patient Details  Name: DHRITI FALES MRN: [id number] Date of Birth: 1941-01-23  Subjective/Objective:     CM following for progression and d/c planning.                Action/Plan: 03/30/2017 Noted CM consult for [facility] and DME, however this pt is a resident of [facility] and plans to return to that SNF upon d/c. Will notifiy [employer] of the need for a VAC and VAC care post d/c. Will continue to follow for d/c needs.   Expected Discharge Date:  04/03/17               Expected Discharge Plan:  Skilled Nursing Facility  In-House Referral:  Clinical Social Work  Discharge planning Services  CM Consult  Post Acute Care Choice:  NA Choice offered to:  NA  DME Arranged:  N/A DME Agency:  NA  HH Arranged:  NA HH Agency:  NA  Status of Service:  In process, will continue to follow  If discussed at Long Length of Stay Meetings, dates discussed:    Additional Comments:  Adron Bene, RN 03/30/2017, 4:27 PM

## 2017-03-30 NOTE — Progress Notes (Signed)
Family Medicine Teaching Service Daily Progress Note Intern Pager: [phone]  Patient name: Taylor Tyler number: [id number] Date of birth: [birth year] Age: 76 y.o. Gender: female  Primary Care Provider: Lenard Simmer, MD Consultants: Neurosurgery Code Status: Full, unable to ask patient as she is altered  Pt Overview and Major Events to Date:  Taylor Tyler is a 76 y.o. female presenting with AMS, diarrhea and vomiting, also planning for spinal procedure in OR with neurosurgery 8/27. PMH is significant for T2DM, CKD 3, HTN, HLD, chronic anemia, hypothyroidism, HLD, and spinal stenosis s/p neurosurgery operations in 01/2017 and 02/2017   Assessment and Plan: AMS - Mildly improved. Uncertain etiology. Broad differential includes infection (no leukocytosis, VSS, afebrile but with diarrhea, vomiting and an open back wound), vs iatrogenic given multiple sedating meds on board, vs uremia (BUN 84, Cr 2.84). Infxn less likely given no leukocytosis, afebrile, UA wnl, C diff neg.  Intracranial process such as stroke considered (CT/MRI head negative for acute process). Hypoglycemia less likely, serum glucose 118 on admission bmp. Intoxication possible though seems very unlikely. ABG nonacidotic, no hypercarbia. CXR no acute cardiopulmonary processes. Severe dehydration with diarrhea likely contributing. TSH nl.  - monitor on telemetry - Blood cultures x2, CXR, CSF (if possible with back wound?), - IV vanc/zosyn (08/26 -) dosed per pharm after cultures are collected - D5NS @50  ml/hr - NPO - limit sedating meds - q4H neuro checks - CBG monitoring TID/AC -Blood cultures, pending -Urine cultures, pending  Open back wound s/p neurosurgery - s/p L3-4 and L4-5 laminectomies with L4-5 Gill procedure, L5-S1 laminectomy with facetectomy, posterior segmental instrumented fusion L3-S1 on 02/26/2017. On admission, open lumbar back wound with packing with clean dressing, no external indications of  local infection.  - Neuro surgery today for washout tomorrow by Dr. Cyndy Freeze with Neurosurgery - keep NPO - will follow up neurosurg recs  Diarrhea/vomiting - No diarrhea, emesis past 24 hrs. Endorsing bilat upper quad ab pain. Keeps questioning "what did I eat?" uncertain severity because patient cannot give a history, however suspect many episodes given AKI and signs of dehydration on labs. C diff negative in ED. May be from doxycycline vs other GI pathogen. Lipase mildly elevated at 72, however not likely pancreatitis given pancreas normal on CT abdomen and no abdominal tenderness to palpation. CT abdomen without acute abnormality. - hold doxycycline (IV abx as above) - IVF as above - Zofran PRN nausea - consider GI panel if AMS does not improve, or pt develops worsening abdominal pain/diarrhea/emesis  AKI on CKD III - Cr 2.87 with BUN 87, from baseline 1.4-1.8. Likely prerenal given recent diarrhea/vomiting/inability to tolerate PO - IVF as above - monitor AM creatinine - holding nephrotoxic agents (home Lasix, HCTZ)  HTN - Last BP, 126/73. BP 190s/40s-50s o/n. HR 56-68. Home regimen includes coreg  25 mg BID, Clonidine 0.2 mg BID, lasix 20 mg daily, losartan 100 mg daily, hctz 25 mg daily, patient is allergic to hydralazine of "chest tightness". Difficult mgmt of bp due to concern to give po, holding nephrotoxic agents due to aki, and allergy to hydralazine - on home clonidine - in order of preference of restarting bp meds, pending improved neuro status  -1. if cr improves, consider adding home lasix due to lower extremity edema, cont to hold losartan/HCTZ in   setting of  given acute kidney injury  -2. if hr stable, trial home coreg, w/ close monitoring of HR as pt was bradycardic on metoprolol - hold  IV metoprolol due to bradycardia - hold hydralazine due to allergy  LE edema, chronic - stable. uncertain etiology. 2+ on exam today. Nl lung exam. satting well on RA. Patient had echo  01/16/2017 which showed EF 55-60%, moderate LVH, no regional wall motion abnormalities. Albumin low at 2.4 which may contribute. Takes PO Lasix 20 mg daily at home. BNP elevated at 492.  - holding lasix for AKI and NPO, consider adding back lasix if aki improves - cont to monitor  T2DM - well controlled; last A1c 5.7 on 02/16/2017. Home regimen is metformin 500 mg BID.  - hold home metformin with AKI and AMS - SSI  Anemia, chronic - Hgb on admission 7.8 from 9-10 baseline, though has been low at 7.8-8.0 for the last month after spinal surgery. CKD likely contributes. Home regimen includes Ferrous sulfate 325 mg daily. - hold home iron in setting of possible acute infection - monitor AM CBC  Hypothyroidism - TSH 0.984. On Synthyroid 100 mcg daily by 8/1 note.  - cont. Home dose  HLD - last lipid panel apparently not available in epic. Home regimen Atorva 40 mg QHS.  - continue statin  FEN/GI: NPO, D5NS @50cc /hr, if AMS improves, advance to soft and d/c fluids s/p surgery Prophylaxis: heparin given kidney injury and back procedure planned for 8/27  Disposition: Telemetry.   Subjective:  Pt does not endorse any pain, dizziness. Able to articulate in full sentences. Keeps questioning "what did I eat?"   Objective: Temp:  [97.6 F (36.4 C)-98.7 F (37.1 C)] 98.7 F (37.1 C) (08/26 2131) Pulse Rate:  [56-68] 66 (08/27 0228) Resp:  [18-21] 20 (08/27 0228) BP: (128-196)/(48-97) 187/51 (08/27 0228) SpO2:  [99 %-100 %] 100 % (08/26 2131) Weight:  [216 lb 11.2 oz (98.3 kg)-217 lb (98.4 kg)] 216 lb 11.2 oz (98.3 kg) (08/26 2131) Physical Exam: General: somnolent, but rousable, able to recognize son next to bed Cardiovascular: rrr, no mrg Respiratory: ctab, no increased work of breathing Abdomen: bilateral upper quadrant ttp, nondistended Extremities:2+ lower extremity edema, 2+ dp pulses  Laboratory:  Recent Labs Lab 03/29/17 1323  WBC 6.9  HGB 7.8*  HCT 24.8*  PLT 302     Recent Labs Lab 03/29/17 1323  NA 137  K 4.3  CL 105  CO2 21*  BUN 87*  CREATININE 2.87*  CALCIUM 8.7*  PROT 6.5  BILITOT 0.6  ALKPHOS 90  ALT 18  AST 28  GLUCOSE 118*      Imaging/Diagnostic Tests: Ct Abdomen Pelvis Wo Contrast  Result Date: 03/29/2017 CLINICAL DATA:  Nausea, vomiting and weakness for 1 week. Altered mental status. Back surgery 1 month ago. EXAM: CT ABDOMEN AND PELVIS WITHOUT CONTRAST TECHNIQUE: Multidetector CT imaging of the abdomen and pelvis was performed following the standard protocol without IV contrast. COMPARISON:  Abdominopelvic CT 07/07/2016. Intraoperative lumbar radiographs 02/26/2017. FINDINGS: Lower chest: There is stable subpleural lipoma at the right lung base on image 8. The lung bases are otherwise clear. No significant pleural or pericardial effusion. The heart is mildly enlarged. Hepatobiliary: Progressive diffuse hepatic steatosis. No focal abnormalities are seen on noncontrast imaging. No significant biliary dilatation status post cholecystectomy. Pancreas: Unremarkable. No pancreatic ductal dilatation or surrounding inflammatory changes. Spleen: Normal in size without focal abnormality. Adrenals/Urinary Tract: Both adrenal glands appear normal. Stable small nonobstructing calculus in the lower pole of the left kidney. No other urinary tract calculi are demonstrated. There is stable mild renal cortical thinning bilaterally. Chronic perinephric soft tissue stranding has  improved. There is no hydronephrosis. The bladder appears unremarkable. Stomach/Bowel: No evidence of bowel wall thickening, distention or surrounding inflammatory change. The distal colonic anastomosis appears patent status post partial colectomy. There are diverticular changes throughout the colon. Broad-based ventral hernia containing transverse colon and multiple loops of small bowel is unchanged. No evidence of incarceration or obstruction. Vascular/Lymphatic: There are no  enlarged abdominal or pelvic lymph nodes. Aortic and branch vessel atherosclerosis noted. Reproductive: Hysterectomy. No evidence of adnexal mass. Probable pelvic floor laxity. Other: As above, large broad-based ventral hernia, grossly stable. No ascites or free air. Musculoskeletal: No acute or significant osseous findings. Interval revision of lower lumbar fusion. The patient is now fused from L3 through S1. The hardware appears intact. There is an open midline wound superiorly with apparent packing material and surrounding soft tissue emphysema. There is no large paraspinal fluid collection. No bone destruction identified. IMPRESSION: 1. No acute abdominopelvic findings identified. 2. Stable large ventral hernia without evidence of incarceration or obstruction. 3. Interval revision of lumbar fusion. There is an open wound containing apparent packing material. 4. Diffuse hepatic steatosis. 5.  Aortic Atherosclerosis (ICD10-I70.0). Electronically Signed   By: Richardean Sale M.D.   On: 03/29/2017 16:13   Ct Head Wo Contrast  Result Date: 03/29/2017 CLINICAL DATA:  Nausea, vomiting, and weakness for 1 week. EXAM: CT HEAD WITHOUT CONTRAST TECHNIQUE: Contiguous axial images were obtained from the base of the skull through the vertex without intravenous contrast. COMPARISON:  April 21, 2014 FINDINGS: Brain: No subdural, epidural, or subarachnoid hemorrhage identified on today's study. Cerebellum, brainstem, and basal cisterns are within normal limits. No mass effect or midline shift. Ventricles and sulci are unremarkable. No acute cortical ischemia or infarct. Vascular: Calcified atherosclerosis is seen in the intracranial carotid arteries. Skull: Normal. Negative for fracture or focal lesion. Sinuses/Orbits: There is a small amount of fluid or mucus in the sphenoid sinuses. This is similar compared to the previous study. Paranasal sinuses, mastoid air cells, and middle ears are otherwise normal. Other: There  is a stable subcutaneous nodule in the posterior scalp on series 3, image 9 of no acute significance. Extracranial soft tissues are otherwise normal. IMPRESSION: 1. No acute intracranial abnormality identified. 2. Mild sinus disease as above. Electronically Signed   By: Dorise Bullion III M.D   On: 03/29/2017 16:05   Mr Brain Wo Contrast  Result Date: 03/30/2017 CLINICAL DATA:  Altered mental status EXAM: MRI HEAD WITHOUT CONTRAST TECHNIQUE: Multiplanar, multiecho pulse sequences of the brain and surrounding structures were obtained without intravenous contrast. COMPARISON:  Head CT 03/29/2017 FINDINGS: Brain: The midline structures are normal. There is no focal diffusion restriction to indicate acute infarct. There is mild multifocal leukoaraiosis, most commonly seen in the setting of chronic hypertensive microangiopathy. No intraparenchymal hematoma or chronic microhemorrhage. Brain volume is normal for age without lobar predominant atrophy. The dura is normal and there is no extra-axial collection. Vascular: Major intracranial arterial and venous sinus flow voids are preserved. Skull and upper cervical spine: The visualized skull base, calvarium, upper cervical spine and extracranial soft tissues are normal. Sinuses/Orbits: No fluid levels or advanced mucosal thickening. No mastoid or middle ear effusion. Normal orbits. IMPRESSION: Mild sequelae of chronic hypertensive microangiopathy. No acute abnormality. Electronically Signed   By: Ulyses Jarred M.D.   On: 03/30/2017 06:14   Dg Chest Port 1 View  Result Date: 03/29/2017 CLINICAL DATA:  76 year old female with altered mental status. Nausea vomiting and weakness for 1 week. EXAM: PORTABLE  CHEST 1 VIEW COMPARISON:  Portable chest 02/26/2017 and earlier. FINDINGS: Portable AP semi upright view at 2129 hours. Stable lung volumes. Mild cardiomegaly. Other mediastinal contours are within normal limits. Allowing for portable technique the lungs are clear. No  pneumothorax or pneumoperitoneum. Visible epigastric bowel gas pattern within normal limits. IMPRESSION: No acute cardiopulmonary abnormality. Electronically Signed   By: Genevie Ann M.D.   On: 03/29/2017 21:56    Bonnita Hollow, MD 03/30/2017, 7:03 AM PGY-1, [facility] Intern pager: [phone], text pages welcome

## 2017-03-30 NOTE — Progress Notes (Signed)
Pt is refusing all PO medications. Pt is confused and thinks she is in her home. This RN has reoriented pt various times but pt becomes agitated and begins to scream.  Pt became verbally abusive towards anyone who tries to touch her. Have reported to MD Nori Riis.  Will continue to monitor   Paulla Fore, RN

## 2017-03-30 NOTE — Progress Notes (Signed)
FPTS Interim Progress Note  S:Patient shaking and complaining of being cold. Says she would like to eat something.   O: BP (!) 124/111 (BP Location: Right Arm)   Pulse 78   Temp 97.8 F (36.6 C) (Oral)   Resp 20   Ht 5' (1.524 m)   Wt 216 lb 11.2 oz (98.3 kg)   SpO2 100%   BMI 42.32 kg/m     A/P: Give patient carb modified diet and d/c IV fluids if she is eating and drinking well.   Kirin Brandenburger, [place], DO 03/30/2017, 3:38 PM PGY-1, [facility] Medicine Service pager [phone]

## 2017-03-30 NOTE — Progress Notes (Signed)
Will proceed with wound exploration and wound vac placement today

## 2017-03-30 NOTE — Transfer of Care (Signed)
Immediate Anesthesia Transfer of Care Note  Patient: Taylor Tyler  Procedure(s) Performed: Procedure(s) with comments: Lumbar wound exploration/debridement, placement of wound vac (N/A) - Lumbar wound exploration/debridement, placement of wound vac APPLICATION OF WOUND VAC (N/A)  Patient Location: PACU  Anesthesia Type:General  Level of Consciousness: awake  Airway & Oxygen Therapy: Patient Spontanous Breathing and Patient connected to nasal cannula oxygen  Post-op Assessment: Report given to RN, Post -op Vital signs reviewed and stable and Patient moving all extremities  Post vital signs: Reviewed and stable  Last Vitals:  Vitals:   03/30/17 0228 03/30/17 0700  BP: (!) 187/51 (!) 126/43  Pulse: 66 (!) 57  Resp: 20 20  Temp:  36.7 C  SpO2:  100%    Last Pain:  Vitals:   03/30/17 0850  TempSrc:   PainSc: 0-No pain         Complications: No apparent anesthesia complications

## 2017-03-30 NOTE — Anesthesia Preprocedure Evaluation (Addendum)
Anesthesia Evaluation  Patient identified by MRN, date of birth, ID band Patient awake    Reviewed: Allergy & Precautions, H&P , NPO status , Patient's Chart, lab work & pertinent test results  History of Anesthesia Complications Negative for: history of anesthetic complications  Airway Mallampati: III  TM Distance: >3 FB Neck ROM: Full    Dental  (+) Teeth Intact, Dental Advisory Given   Pulmonary neg pulmonary ROS,    breath sounds clear to auscultation       Cardiovascular Exercise Tolerance: Good hypertension, Pt. on medications and Pt. on home beta blockers  Rhythm:Regular Rate:Normal  TTE 01/2017 - Left ventricle: The cavity size was normal. Wall thickness was increased in a pattern of moderate LVH. Systolic function was normal. The estimated ejection fraction was in the range of 55% to 60%. Wall motion was normal; there were no regional wall motion abnormalities. - Left atrium: The atrium was mildly dilated. - Atrial septum: No defect or patent foramen ovale was identified.   Neuro/Psych negative neurological ROS  negative psych ROS   GI/Hepatic negative GI ROS, Neg liver ROS,   Endo/Other  diabetes, Type 2, Oral Hypoglycemic AgentsHypothyroidism Morbid obesity  Renal/GU CRFRenal disease     Musculoskeletal  (+) Arthritis , Osteoarthritis,    Abdominal   Peds  Hematology  (+) anemia ,   Anesthesia Other Findings   Reproductive/Obstetrics                                                             Anesthesia Evaluation  Patient identified by MRN, date of birth, ID band Patient awake    Reviewed: Allergy & Precautions, H&P , NPO status , Patient's Chart, lab work & pertinent test results  Airway Mallampati: III  TM Distance: >3 FB Neck ROM: Full    Dental no notable dental hx. (+) Teeth Intact, Dental Advisory Given   Pulmonary neg pulmonary ROS,    Pulmonary exam  normal breath sounds clear to auscultation       Cardiovascular Exercise Tolerance: Good hypertension, Pt. on medications and Pt. on home beta blockers  Rhythm:Regular Rate:Normal  TTE 01/2017 - Left ventricle: The cavity size was normal. Wall thickness was increased in a pattern of moderate LVH. Systolic function was normal. The estimated ejection fraction was in the range of 55% to 60%. Wall motion was normal; there were no regional wall motion abnormalities. - Left atrium: The atrium was mildly dilated. - Atrial septum: No defect or patent foramen ovale was identified.   Neuro/Psych negative neurological ROS  negative psych ROS   GI/Hepatic negative GI ROS, Neg liver ROS,   Endo/Other  diabetes, Type 2, Oral Hypoglycemic AgentsHypothyroidism Morbid obesity  Renal/GU CRFRenal disease  negative genitourinary   Musculoskeletal  (+) Arthritis , Osteoarthritis,    Abdominal   Peds  Hematology  (+) anemia ,   Anesthesia Other Findings   Reproductive/Obstetrics negative OB ROS                             Anesthesia Physical  Anesthesia Plan  ASA: III  Anesthesia Plan: General   Post-op Pain Management:    Induction: Intravenous  PONV Risk Score and Plan: 4 or greater and Ondansetron, Dexamethasone, Midazolam and  Treatment may vary due to age or medical condition  Airway Management Planned: Oral ETT  Additional Equipment:   Intra-op Plan:   Post-operative Plan: Extubation in OR  Informed Consent: I have reviewed the patients History and Physical, chart, labs and discussed the procedure including the risks, benefits and alternatives for the proposed anesthesia with the patient or authorized representative who has indicated his/her understanding and acceptance.   Dental advisory given  Plan Discussed with: CRNA  Anesthesia Plan Comments:         Anesthesia Quick Evaluation  Anesthesia Physical  Anesthesia Plan  ASA:  III  Anesthesia Plan: General   Post-op Pain Management:    Induction: Intravenous  PONV Risk Score and Plan: 4 or greater and Ondansetron, Dexamethasone, Midazolam and Treatment may vary due to age or medical condition  Airway Management Planned: Oral ETT  Additional Equipment: None  Intra-op Plan:   Post-operative Plan: Extubation in OR  Informed Consent: I have reviewed the patients History and Physical, chart, labs and discussed the procedure including the risks, benefits and alternatives for the proposed anesthesia with the patient or authorized representative who has indicated his/her understanding and acceptance.   Dental advisory given  Plan Discussed with: CRNA and Surgeon  Anesthesia Plan Comments:        Anesthesia Quick Evaluation

## 2017-03-30 NOTE — Anesthesia Postprocedure Evaluation (Signed)
Anesthesia Post Note  Patient: Taylor Tyler  Procedure(s) Performed: Procedure(s) (LRB): Lumbar wound exploration/debridement, placement of wound vac (N/A) APPLICATION OF WOUND VAC (N/A)     Patient location during evaluation: PACU Anesthesia Type: General Level of consciousness: awake and alert Pain management: pain level controlled Vital Signs Assessment: post-procedure vital signs reviewed and stable Respiratory status: spontaneous breathing, nonlabored ventilation, respiratory function stable and patient connected to nasal cannula oxygen Cardiovascular status: blood pressure returned to baseline and stable Postop Assessment: no signs of nausea or vomiting Anesthetic complications: no    Last Vitals:  Vitals:   03/30/17 1451 03/30/17 1525  BP: (!) 172/99 (!) 124/111  Pulse:  78  Resp:  20  Temp:  36.6 C  SpO2:  100%    Last Pain:  Vitals:   03/30/17 1525  TempSrc: Oral  PainSc:                  Taylor Tyler

## 2017-03-30 NOTE — Anesthesia Procedure Notes (Signed)
Procedure Name: Intubation Date/Time: 03/30/2017 12:35 PM Performed by: Melina Copa, Alick Lecomte R Pre-anesthesia Checklist: Patient identified, Emergency Drugs available, Suction available and Patient being monitored Patient Re-evaluated:Patient Re-evaluated prior to induction Oxygen Delivery Method: Circle System Utilized Preoxygenation: Pre-oxygenation with 100% oxygen Induction Type: IV induction Ventilation: Mask ventilation without difficulty Laryngoscope Size: Mac and 3 Grade View: Grade I Tube type: Oral Number of attempts: 1 Airway Equipment and Method: Stylet Placement Confirmation: ETT inserted through vocal cords under direct vision,  positive ETCO2 and breath sounds checked- equal and bilateral Secured at: 20 cm Tube secured with: Tape Dental Injury: Teeth and Oropharynx as per pre-operative assessment

## 2017-03-30 NOTE — Brief Op Note (Signed)
03/29/2017 - 03/30/2017  2:25 PM  PATIENT:  Taylor Tyler  76 y.o. female  PRE-OPERATIVE DIAGNOSIS:  Postoperative wound dehiscence  POST-OPERATIVE DIAGNOSIS:  Postoperative wound dehiscence  PROCEDURE:  Procedure(s) with comments: Lumbar wound exploration/debridement, placement of wound vac (N/A) - Lumbar wound exploration/debridement, placement of wound vac APPLICATION OF WOUND VAC (N/A)  SURGEON:  Surgeon(s) and Role:    * Hurman Ketelsen, Kevan Ny, MD - Primary  PHYSICIAN ASSISTANT:   ASSISTANTS: none   ANESTHESIA:   general  EBL:  No intake/output data recorded.  BLOOD ADMINISTERED:none  DRAINS: Wound vac   LOCAL MEDICATIONS USED:  MARCAINE     SPECIMEN:  Aspirate  DISPOSITION OF SPECIMEN:  Microbiology  COUNTS:  YES  TOURNIQUET:  * No tourniquets in log *  DICTATION: .Dragon Dictation  PLAN OF CARE: Admit to inpatient   PATIENT DISPOSITION:  PACU - hemodynamically stable.   Delay start of Pharmacological VTE agent (>24hrs) due to surgical blood loss or risk of bleeding: yes

## 2017-03-31 ENCOUNTER — Encounter (HOSPITAL_COMMUNITY): Payer: Self-pay | Admitting: Neurological Surgery

## 2017-03-31 DIAGNOSIS — D62 Acute posthemorrhagic anemia: Secondary | ICD-10-CM

## 2017-03-31 LAB — CBC
HEMATOCRIT: 21.9 % — AB (ref 36.0–46.0)
Hemoglobin: 6.9 g/dL — CL (ref 12.0–15.0)
MCH: 27.3 pg (ref 26.0–34.0)
MCHC: 31.5 g/dL (ref 30.0–36.0)
MCV: 86.6 fL (ref 78.0–100.0)
PLATELETS: 252 10*3/uL (ref 150–400)
RBC: 2.53 MIL/uL — AB (ref 3.87–5.11)
RDW: 14.7 % (ref 11.5–15.5)
WBC: 10.4 10*3/uL (ref 4.0–10.5)

## 2017-03-31 LAB — PREPARE RBC (CROSSMATCH)

## 2017-03-31 LAB — GLUCOSE, CAPILLARY
GLUCOSE-CAPILLARY: 134 mg/dL — AB (ref 65–99)
Glucose-Capillary: 143 mg/dL — ABNORMAL HIGH (ref 65–99)
Glucose-Capillary: 104 mg/dL — ABNORMAL HIGH (ref 65–99)

## 2017-03-31 LAB — URINE CULTURE

## 2017-03-31 MED ORDER — SODIUM CHLORIDE 0.9 % IV SOLN: Freq: Once | INTRAVENOUS | Status: DC

## 2017-03-31 MED ORDER — ACETAMINOPHEN 500 MG PO TABS
1000.0000 mg | ORAL_TABLET | Freq: Three times a day (TID) | ORAL | Status: DC
  Administered 2017-03-31 – 2017-04-07 (×22): 1000 mg via ORAL
  Filled 2017-03-31 (×23): qty 2

## 2017-03-31 MED ORDER — PIPERACILLIN-TAZOBACTAM 3.375 G IVPB
3.3750 g | Freq: Three times a day (TID) | INTRAVENOUS | Status: DC
  Administered 2017-03-31 – 2017-04-01 (×2): 3.375 g via INTRAVENOUS
  Filled 2017-03-31 (×4): qty 50

## 2017-03-31 MED ORDER — BACLOFEN 10 MG PO TABS: 5.0000 mg | ORAL_TABLET | Freq: Two times a day (BID) | ORAL | Status: DC | PRN

## 2017-03-31 MED ORDER — FUROSEMIDE 10 MG/ML IJ SOLN
20.0000 mg | Freq: Once | INTRAMUSCULAR | Status: AC
  Administered 2017-03-31: 20 mg via INTRAVENOUS
  Filled 2017-03-31: qty 2

## 2017-03-31 MED ORDER — OXYCODONE HCL 5 MG PO TABS
5.0000 mg | ORAL_TABLET | Freq: Three times a day (TID) | ORAL | Status: DC | PRN
  Administered 2017-04-01 – 2017-04-06 (×3): 5 mg via ORAL
  Filled 2017-03-31 (×4): qty 1

## 2017-03-31 MED ORDER — VANCOMYCIN HCL IN DEXTROSE 1-5 GM/200ML-% IV SOLN
1000.0000 mg | INTRAVENOUS | Status: DC
  Administered 2017-03-31: 1000 mg via INTRAVENOUS
  Filled 2017-03-31 (×2): qty 200

## 2017-03-31 NOTE — Progress Notes (Signed)
Family Medicine Teaching Service Daily Progress Note Intern Pager: [phone]  Patient name: Taylor Tyler record number: [id number] Date of birth: [birth year] Age: 76 y.o. Gender: female  Primary Care Provider: Lenard Simmer, MD Consultants: Neurosurgery Code Status:Full, unable to ask patient as she is altered  Pt Overview and Major Events to Date:  Taylor Tyler a 76 y.o.femalepresenting with AMS, diarrhea and vomiting, also planning for spinal procedure in OR with neurosurgery 8/27. PMH is significant for T2DM, CKD 3, HTN, HLD, chronic anemia, hypothyroidism, HLD, and spinal stenosis s/p neurosurgery operations in 01/2017 and 02/2017   Assessment and Plan: AMS- Mildly improved. Uncertain etiology. Broad differential includes infection (no leukocytosis, VSS, afebrile but with diarrhea, vomiting and an open back wound), vs iatrogenic given multiple sedating meds on board, vs uremia (BUN 84, Cr 2.84). Infxn less likely given no leukocytosis, afebrile, UA wnl, C diff neg.  Intracranial process such as stroke considered (CT/MRI head negative for acute process). Hypoglycemia less likely, serum glucose 118 on admission bmp. Intoxication possible though seems very unlikely. ABG nonacidotic, no hypercarbia. CXR no acute cardiopulmonary processes. Severe dehydration with diarrhea likely contributing. TSH nl.  - monitor on telemetry - Blood cultures x2, CXR, CSF (if possible with back wound?), - IV vanc/zosyn (08/29 -) restarted after surgery....dosed per pharm - limit sedating meds - q4H neuro checks - CBG monitoring TID/AC -Blood cultures, pending -Urine cultures, pending  Open back wound s/p neurosurgery - s/p L3-4 and L4-5 laminectomies with L4-5 Gill procedure, L5-S1 laminectomy with facetectomy, posterior segmental instrumented fusion L3-S1 on 02/26/2017. On admission, open lumbar back wound with packing with clean dressing, no external indications of local infection.  - Neuro  surgery today for washout tomorrow by Dr. Cyndy Freeze with Neurosurgery - keep NPO - will follow up neurosurg recs  Diarrhea/vomiting- No diarrhea, emesis past 24 hrs. Endorsing bilat upper quad ab pain.  C diff negative in ED. May be fromdoxycycline vs other GI pathogen. Lipase mildly elevated at 72, however not likely pancreatitis given pancreas normal on CT abdomen and no abdominal tenderness to palpation. CT abdomen without acute abnormality. - hold doxycycline (IV abx as above) - Zofran PRN nausea - consider GI panel if AMS does not improve, or pt develops worsening abdominal pain/diarrhea/emesis  AKI on CKD III - Cr 2.31 with BUN 63, from baseline 1.4-1.8. Likely prerenal given recent diarrhea/vomiting/inability to tolerate PO - monitor AM creatinine - holding nephrotoxic agents (home Lasix, HCTZ)  HTN - Last BP, 175/55. HR 50s-60s. Home regimen includes coreg 25 mg BID, Clonidine 0.2 mg BID, lasix 20 mg daily, losartan 100 mg daily, hctz 25 mg daily, patient is allergic to hydralazine of "chest tightness". Difficult mgmt of bp due to concern to give po, holding nephrotoxic agents due to aki, and allergy to hydralazine -need to discuss next [facility] treatment - on home clonidine - in order of preference of restarting bp meds, pending improved neuro status             -1. if cr improves, consider adding home lasix due to lower extremity edema, cont to hold losartan/HCTZ in setting of  given acute kidney injury             -2. if hr stable, trial home coreg, w/ close monitoring of HR as pt was bradycardic on metoprolol - hold  IV metoprolol due to bradycardia - hold hydralazine due to allergy  LE edema, chronic - stable. uncertain etiology. 2+ on exam today. Nl lung  exam. satting well on RA. Patient had echo 01/16/2017 which showed EF 55-60%, moderate LVH, no regional wall motion abnormalities. Albumin low at 2.4 which may contribute. Takes PO Lasix 20 mg daily at home. BNP elevated at 492.  -  holding lasix for AKI and NPO, consider adding back lasix if aki improves - cont to monitor  T2DM- well controlled; last A1c 5.7 on 02/16/2017. Home regimen is metformin 500 mg BID.  - hold home metformin with AKI and AMS - SSI  Anemia, chronic- Hgb 7.2 from 9-10 baseline, though has been low at 7.8-8.0 for the last month after spinal surgery. CKD likely contributes. Home regimen includes Ferrous sulfate 325 mg daily. - hold home iron in setting of possible acute infection -discuss restarting homr iron and transfusion threshold - monitor AM CBC  Hypothyroidism - TSH 0.984. On Synthyroid 100 mcg daily by 8/1 note.  - cont. Home dose  HLD- last lipid panel apparently not available in epic. Home regimen Atorva 40 mg QHS.  - continue statin  FEN/GI: NPO, D5NS @50cc /hr, if AMS improves, advance to soft and d/c fluids s/p surgery Prophylaxis: heparin given kidney injury and back procedure planned for 8/27  Disposition:Telemetry.   Subjective:  Said she felt good but was a little disoriented to time/place.   Pleasant and fixated on eating but having trouble with fine motor control (asked NT to assist with eating)  Objective: Temp:  [97.6 F (36.4 C)-98 F (36.7 C)] 97.8 F (36.6 C) (08/28 0456) Pulse Rate:  [56-78] 56 (08/28 0456) Resp:  [11-21] 19 (08/28 0456) BP: (124-205)/(43-111) 175/55 (08/28 0456) SpO2:  [99 %-100 %] 100 % (08/28 0456) Weight:  [217 lb (98.4 kg)] 217 lb (98.4 kg) (08/27 2028) Physical Exam: General: alert, AOx1, trying to eat but having difficulty with fine motor control Cardiovascular: rrr, systolic murmur Respiratory: ctab, no increased work of breathing Abdomen: bilateral upper quadrant ttp, nondistended Extremities:2+ lower extremity edema, 2+ dp pulses Neuro: intention tremor in arms  Laboratory:  Recent Labs Lab 03/29/17 1323 03/30/17 1818  WBC 6.9 9.4  HGB 7.8* 7.2*  HCT 24.8* 23.2*  PLT 302 278    Recent Labs Lab 03/29/17 1323  03/30/17 1818  NA 137 144  K 4.3 3.8  CL 105 115*  CO2 21* 22  BUN 87* 63*  CREATININE 2.87* 2.31*  CALCIUM 8.7* 8.3*  PROT 6.5 5.8*  BILITOT 0.6 0.6  ALKPHOS 90 82  ALT 18 18  AST 28 29  GLUCOSE 118* 155*      Imaging/Diagnostic Tests: Ct Abdomen Pelvis Wo Contrast  Result Date: 03/29/2017 CLINICAL DATA:  Nausea, vomiting and weakness for 1 week. Altered mental status. Back surgery 1 month ago. EXAM: CT ABDOMEN AND PELVIS WITHOUT CONTRAST TECHNIQUE: Multidetector CT imaging of the abdomen and pelvis was performed following the standard protocol without IV contrast. COMPARISON:  Abdominopelvic CT 07/07/2016. Intraoperative lumbar radiographs 02/26/2017. FINDINGS: Lower chest: There is stable subpleural lipoma at the right lung base on image 8. The lung bases are otherwise clear. No significant pleural or pericardial effusion. The heart is mildly enlarged. Hepatobiliary: Progressive diffuse hepatic steatosis. No focal abnormalities are seen on noncontrast imaging. No significant biliary dilatation status post cholecystectomy. Pancreas: Unremarkable. No pancreatic ductal dilatation or surrounding inflammatory changes. Spleen: Normal in size without focal abnormality. Adrenals/Urinary Tract: Both adrenal glands appear normal. Stable small nonobstructing calculus in the lower pole of the left kidney. No other urinary tract calculi are demonstrated. There is stable mild renal cortical  thinning bilaterally. Chronic perinephric soft tissue stranding has improved. There is no hydronephrosis. The bladder appears unremarkable. Stomach/Bowel: No evidence of bowel wall thickening, distention or surrounding inflammatory change. The distal colonic anastomosis appears patent status post partial colectomy. There are diverticular changes throughout the colon. Broad-based ventral hernia containing transverse colon and multiple loops of small bowel is unchanged. No evidence of incarceration or obstruction.  Vascular/Lymphatic: There are no enlarged abdominal or pelvic lymph nodes. Aortic and branch vessel atherosclerosis noted. Reproductive: Hysterectomy. No evidence of adnexal mass. Probable pelvic floor laxity. Other: As above, large broad-based ventral hernia, grossly stable. No ascites or free air. Musculoskeletal: No acute or significant osseous findings. Interval revision of lower lumbar fusion. The patient is now fused from L3 through S1. The hardware appears intact. There is an open midline wound superiorly with apparent packing material and surrounding soft tissue emphysema. There is no large paraspinal fluid collection. No bone destruction identified. IMPRESSION: 1. No acute abdominopelvic findings identified. 2. Stable large ventral hernia without evidence of incarceration or obstruction. 3. Interval revision of lumbar fusion. There is an open wound containing apparent packing material. 4. Diffuse hepatic steatosis. 5.  Aortic Atherosclerosis (ICD10-I70.0). Electronically Signed   By: Richardean Sale M.D.   On: 03/29/2017 16:13   Ct Head Wo Contrast  Result Date: 03/29/2017 CLINICAL DATA:  Nausea, vomiting, and weakness for 1 week. EXAM: CT HEAD WITHOUT CONTRAST TECHNIQUE: Contiguous axial images were obtained from the base of the skull through the vertex without intravenous contrast. COMPARISON:  April 21, 2014 FINDINGS: Brain: No subdural, epidural, or subarachnoid hemorrhage identified on today's study. Cerebellum, brainstem, and basal cisterns are within normal limits. No mass effect or midline shift. Ventricles and sulci are unremarkable. No acute cortical ischemia or infarct. Vascular: Calcified atherosclerosis is seen in the intracranial carotid arteries. Skull: Normal. Negative for fracture or focal lesion. Sinuses/Orbits: There is a small amount of fluid or mucus in the sphenoid sinuses. This is similar compared to the previous study. Paranasal sinuses, mastoid air cells, and middle ears are  otherwise normal. Other: There is a stable subcutaneous nodule in the posterior scalp on series 3, image 9 of no acute significance. Extracranial soft tissues are otherwise normal. IMPRESSION: 1. No acute intracranial abnormality identified. 2. Mild sinus disease as above. Electronically Signed   By: Dorise Bullion III M.D   On: 03/29/2017 16:05   Mr Brain Wo Contrast  Result Date: 03/30/2017 CLINICAL DATA:  Altered mental status EXAM: MRI HEAD WITHOUT CONTRAST TECHNIQUE: Multiplanar, multiecho pulse sequences of the brain and surrounding structures were obtained without intravenous contrast. COMPARISON:  Head CT 03/29/2017 FINDINGS: Brain: The midline structures are normal. There is no focal diffusion restriction to indicate acute infarct. There is mild multifocal leukoaraiosis, most commonly seen in the setting of chronic hypertensive microangiopathy. No intraparenchymal hematoma or chronic microhemorrhage. Brain volume is normal for age without lobar predominant atrophy. The dura is normal and there is no extra-axial collection. Vascular: Major intracranial arterial and venous sinus flow voids are preserved. Skull and upper cervical spine: The visualized skull base, calvarium, upper cervical spine and extracranial soft tissues are normal. Sinuses/Orbits: No fluid levels or advanced mucosal thickening. No mastoid or middle ear effusion. Normal orbits. IMPRESSION: Mild sequelae of chronic hypertensive microangiopathy. No acute abnormality. Electronically Signed   By: Ulyses Jarred M.D.   On: 03/30/2017 06:14   Dg Chest Port 1 View  Result Date: 03/29/2017 CLINICAL DATA:  76 year old female with altered mental status. Nausea  vomiting and weakness for 1 week. EXAM: PORTABLE CHEST 1 VIEW COMPARISON:  Portable chest 02/26/2017 and earlier. FINDINGS: Portable AP semi upright view at 2129 hours. Stable lung volumes. Mild cardiomegaly. Other mediastinal contours are within normal limits. Allowing for portable  technique the lungs are clear. No pneumothorax or pneumoperitoneum. Visible epigastric bowel gas pattern within normal limits. IMPRESSION: No acute cardiopulmonary abnormality. Electronically Signed   By: Genevie Ann M.D.   On: 03/29/2017 21:56     Sherene Sires, DO 03/31/2017, 6:23 AM PGY-1, [facility] Intern pager: [phone], text pages welcome

## 2017-03-31 NOTE — Progress Notes (Signed)
Notified family Medicine of pt BP 169/51 no PRN medications for HTN.   Will continue to monitor   Paulla Fore, RN

## 2017-03-31 NOTE — Evaluation (Signed)
Physical Therapy Evaluation Patient Details Name: Taylor Tyler MRN: [id number] DOB: [birth year] Today's Date: 03/31/2017   History of Present Illness  Taylor Tyler is a 76 y.o. female presenting with AMS, diarrhea and vomiting, also s/p exploration of lumbar wound and application of wound VAC in OR on 8/27. PMH is significant for T2DM, CKD 3, HTN, HLD, chronic anemia, hypothyroidism, HLD, and spinal stenosis s/p neurosurgery operations in 01/2017 and 02/2017   Clinical Impression   Pt admitted with above diagnosis, and s/p exploration of lumbar wound and VAC placement. Had been at [facility] for rehab prior to this admission; Pt currently with functional limitations due to the deficits listed below (see PT Problem List).  Pt will benefit from skilled PT to increase their independence and safety with mobility to allow discharge to the venue listed below.       Follow Up Recommendations SNF    Equipment Recommendations  Rolling walker with 5" wheels;3in1 (PT)    Recommendations for Other Services       Precautions / Restrictions Precautions Precautions: Fall;Back Precaution Booklet Issued: No Precaution Comments: pt unable to recall back precautions, educated Required Braces or Orthoses: Spinal Brace Spinal Brace: Applied in sitting position;Other (comment) (may ambulate to bathroom without brace) Spinal Brace Comments: brace is likely still at SNF, asked pt to have family member transport to hospital, PA aware Restrictions Weight Bearing Restrictions: No      Mobility  Bed Mobility Overal bed mobility: Needs Assistance Bed Mobility: Rolling;Sidelying to Sit;Sit to Sidelying Rolling: Max assist;+2 for physical assistance Sidelying to sit: +2 for physical assistance;Max assist     Sit to sidelying: Max assist;+2 for physical assistance General bed mobility comments: Cues and close guard for back precautions; Max assist to toll and elevate trunk to sit  Transfers Overall transfer  level: Needs assistance Equipment used: Rolling walker (2 wheeled) Transfers: Sit to/from Stand Sit to Stand: Mod assist;Min assist         General transfer comment: mod assist to rise from bed, min from 3 in 1, multimodal cues for hand placement  Ambulation/Gait Ambulation/Gait assistance: Min assist;+2 safety/equipment Ambulation Distance (Feet): 20 Feet (to and from bathroom) Assistive device: Rolling walker (2 wheeled) Gait Pattern/deviations: Decreased step length - right;Decreased step length - left;Decreased stride length   Gait velocity interpretation: Below normal speed for age/gender General Gait Details: Cues to attend to task at hand; Walks smoother when given a goal; Eyes closed frequently; very close guard for safety  Stairs            Wheelchair Mobility    Modified Rankin (Stroke Patients Only)       Balance Overall balance assessment: Needs assistance   Sitting balance-Leahy Scale: Fair     Standing balance support: Bilateral upper extremity supported Standing balance-Leahy Scale: Poor Standing balance comment: reliant on B UE support in static standing                             Pertinent Vitals/Pain Pain Assessment: Faces Faces Pain Scale: Hurts even more Pain Location: back, especially when performing bed mobility Pain Descriptors / Indicators: Discomfort;Grimacing;Guarding;Moaning Pain Intervention(s): Monitored during session    Home Living Family/patient expects to be discharged to:: Skilled nursing facility                      Prior Function Level of Independence: Independent with assistive device(s)  Comments: Pt uses RW when her back is bothering her.       Hand Dominance   Dominant Hand: Right    Extremity/Trunk Assessment   Upper Extremity Assessment Upper Extremity Assessment: Defer to OT evaluation    Lower Extremity Assessment Lower Extremity Assessment: Generalized weakness     Cervical / Trunk Assessment Cervical / Trunk Assessment: Other exceptions Cervical / Trunk Exceptions: wound vac  Communication   Communication: No difficulties  Cognition Arousal/Alertness: Awake/alert (although closes her eyes frequently) Behavior During Therapy: Flat affect Overall Cognitive Status: Impaired/Different from baseline Area of Impairment: Orientation;Attention;Memory;Following commands;Safety/judgement;Problem solving                 Orientation Level: Disoriented to;Place;Time;Situation Current Attention Level: Sustained Memory: Decreased recall of precautions;Decreased short-term memory Following Commands: Follows one step commands with increased time Safety/Judgement: Decreased awareness of safety;Decreased awareness of deficits   Problem Solving: Slow processing;Decreased initiation;Difficulty sequencing;Requires verbal cues;Requires tactile cues        General Comments      Exercises     Assessment/Plan    PT Assessment Patient needs continued PT services  PT Problem List Decreased strength;Decreased range of motion;Decreased activity tolerance;Decreased balance;Decreased mobility;Decreased coordination;Decreased cognition;Decreased knowledge of use of DME;Decreased safety awareness;Decreased knowledge of precautions;Pain       PT Treatment Interventions DME instruction;Gait training;Functional mobility training;Therapeutic activities;Therapeutic exercise;Balance training;Patient/family education    PT Goals (Current goals can be found in the Care Plan section)  Acute Rehab PT Goals Patient Stated Goal: did not state PT Goal Formulation: With patient Time For Goal Achievement: 04/14/17 Potential to Achieve Goals: Fair    Frequency Min 3X/week   Barriers to discharge        Co-evaluation PT/OT/SLP Co-Evaluation/Treatment: Yes Reason for Co-Treatment: Necessary to address cognition/behavior during functional activity;For  patient/therapist safety PT goals addressed during session: Mobility/safety with mobility OT goals addressed during session: ADL's and self-care       AM-PAC PT "6 Clicks" Daily Activity  Outcome Measure Difficulty turning over in bed (including adjusting bedclothes, sheets and blankets)?: Unable Difficulty moving from lying on back to sitting on the side of the bed? : Unable Difficulty sitting down on and standing up from a chair with arms (e.g., wheelchair, bedside commode, etc,.)?: Unable Help needed moving to and from a bed to chair (including a wheelchair)?: Total Help needed walking in hospital room?: A Lot Help needed climbing 3-5 steps with a railing? : A Lot 6 Click Score: 8    End of Session Equipment Utilized During Treatment: Gait belt (at axillae) Activity Tolerance: Patient tolerated treatment well Patient left: in bed;with call bell/phone within reach Nurse Communication: Mobility status PT Visit Diagnosis: Other abnormalities of gait and mobility (R26.89);Pain Pain - part of body:  (Back)    Time: 2355-7322 PT Time Calculation (min) (ACUTE ONLY): 34 min   Charges:   PT Evaluation $PT Eval Moderate Complexity: 1 Mod     PT G Codes:        Roney Marion, PT  Acute Rehabilitation Services Pager [phone] Office [phone]   Colletta [place] 03/31/2017, 12:20 PM

## 2017-03-31 NOTE — Progress Notes (Signed)
Pharmacy Antibiotic Note  Taylor Tyler is a 76 y.o. female admitted on 03/29/2017 with AMS and concern for sepsis.  Pharmacy has been consulted for Vancomycin and Zosyn dosing.  The patient was noted to have AKI on admission - now improving. SCr down to 2.31 << 2.87, CrCl~20-25 ml/min. Will adjust antibiotic doses today.   Plan: 1. Adjust Vancomycin to 1g IV every 24 hours 2. Adjust Zosyn to 3.375g IV every 8 hours (infused over 4 hours) 3. Will continue to follow renal function, culture results, LOT, and antibiotic de-escalation plans   Height: 5' (152.4 cm) Weight: 217 lb (98.4 kg) IBW/kg (Calculated) : 45.5  Temp (24hrs), Avg:97.8 F (36.6 C), Min:97.6 F (36.4 C), Max:98 F (36.7 C)   Recent Labs Lab 03/29/17 1323 03/29/17 1329 03/30/17 1818 03/31/17 0757  WBC 6.9  --  9.4 10.4  CREATININE 2.87*  --  2.31*  --   LATICACIDVEN  --  1.17  --   --     Estimated Creatinine Clearance: 22.5 mL/min (A) (by C-G formula based on SCr of 2.31 mg/dL (H)).    Allergies  Allergen Reactions  . Clams [Shellfish Allergy] Swelling and Other (See Comments)    THROAT SWELLS NECK TURNS RED  . Hydralazine Other (See Comments)    CHEST TIGHTNESS    Antimicrobials this admission: Doxy PTA 8/26 Vanc >> 8/26 Zosyn >>  Microbiology results: 8/26 CDiff >> neg 8/26 BCx >> ngtd 8/26 UCx >> mult species 8/27 WCx >> ngtd  Thank you for allowing pharmacy to be a part of this patient's care.  Alycia Rossetti, PharmD, BCPS Clinical Pharmacist Pager: [phone] Clinical phone for 03/31/2017 from 7a-3:30p: [phone] If after 3:30p, please call main pharmacy at: [phone] 03/31/2017 11:53 AM

## 2017-03-31 NOTE — Progress Notes (Addendum)
CRITICAL VALUE STICKER  CRITICAL VALUE: hemoglobin 6.9  RECEIVER (on-site recipient of call): Emilio Math RN   DATE & TIME NOTIFIED: 03/31/17 0908  MESSENGER (representative from lab):  MD NOTIFIED: Verlon Au MD   TIME OF NOTIFICATION: 0908  RESPONSE: monitor Hgb   1100- Transfuse 2 units of PRBCs

## 2017-03-31 NOTE — Progress Notes (Signed)
Pt was independent with assistive devices prior to her back surgery. Pt presents with generalized weakness, impaired cognition and decreased standing balance. She requires set up to total assist for ADL and 2 person assist for bed mobility. She was able to ambulate to the bathroom with min assist and second person for safety. Recommending return to SNF for further rehab. Will follow.  03/31/17 1100  OT Visit Information  Last OT Received On 03/31/17  Assistance Needed +2  PT/OT/SLP Co-Evaluation/Treatment Yes  Reason for Co-Treatment Necessary to address cognition/behavior during functional activity;For patient/therapist safety  OT goals addressed during session ADL's and self-care  History of Present Illness Taylor Tyler is a 76 y.o. female presenting with AMS, diarrhea and vomiting, also s/p exploration of lumbar wound and application of wound VAC in OR on 8/27. PMH is significant for T2DM, CKD 3, HTN, HLD, chronic anemia, hypothyroidism, HLD, and spinal stenosis s/p neurosurgery operations in 01/2017 and 02/2017   Precautions  Precautions Fall;Back  Precaution Booklet Issued No  Precaution Comments pt unable to recall back precautions, educated  Required Braces or Orthoses Spinal Brace  Spinal Brace Applied in sitting position;Other (comment) (may ambulate to bathroom without brace)  Spinal Brace Comments brace is likely still at SNF, asked pt to have family member transport to hospital  Restrictions  Weight Bearing Restrictions No  Home Living  Family/patient expects to be discharged to: Skilled nursing facility  Prior Function  Level of Independence Independent with assistive device(s)  Comments Pt uses RW when her back is bothering her.    Communication  Communication No difficulties  Pain Assessment  Pain Assessment Faces  Faces Pain Scale 6  Pain Location back  Pain Descriptors / Indicators Discomfort;Grimacing;Guarding;Moaning  Pain Intervention(s) Monitored during  session;Repositioned  Cognition  Arousal/Alertness Awake/alert (although closes her eyes frequently)  Behavior During Therapy Flat affect  Overall Cognitive Status Impaired/Different from baseline  Area of Impairment Orientation;Attention;Memory;Following commands;Safety/judgement;Problem solving  Orientation Level Disoriented to;Place;Time;Situation  Current Attention Level Sustained  Memory Decreased recall of precautions;Decreased short-term memory  Following Commands Follows one step commands with increased time  Safety/Judgement Decreased awareness of safety;Decreased awareness of deficits  Problem Solving Slow processing;Decreased initiation;Difficulty sequencing;Requires verbal cues;Requires tactile cues  Upper Extremity Assessment  Upper Extremity Assessment Generalized weakness  Lower Extremity Assessment  Lower Extremity Assessment Defer to PT evaluation  Cervical / Trunk Assessment  Cervical / Trunk Assessment Other exceptions  Cervical / Trunk Exceptions wound vac  ADL  Overall ADL's  Needs assistance/impaired  Eating/Feeding Set up;Bed level  Grooming Wash/dry hands;Wash/dry face;Sitting;Supervision/safety  Upper Body Bathing Moderate assistance;Sitting  Lower Body Bathing Total assistance;Sit to/from stand  Upper Body Dressing  Moderate assistance;Sitting  Lower Body Dressing Total assistance;Sit to/from stand;Bed level  Toilet Transfer Minimal assistance;Ambulation;RW;BSC  Toileting- Water quality scientist and Hygiene Total assistance;Sit to/from stand  Functional mobility during ADLs Minimal assistance;Cueing for sequencing;Rolling walker;Cueing for safety;+2 for safety/equipment  Vision- History  Patient Visual Report No change from baseline  Bed Mobility  Overal bed mobility Needs Assistance  Bed Mobility Rolling;Sidelying to Sit;Sit to Sidelying  Rolling Max assist;+2 for physical assistance  Sidelying to sit +2 for physical assistance;Max assist  Sit to  sidelying Max assist;+2 for physical assistance  Transfers  Overall transfer level Needs assistance  Equipment used Rolling walker (2 wheeled)  Transfers Sit to/from Stand  Sit to Stand Mod assist;Min assist  General transfer comment mod assist to rise from bed, min from 3 in 1, multimodal cues for hand placement  Balance  Overall balance assessment Needs assistance  Sitting balance-Leahy Scale Fair  Standing balance support Bilateral upper extremity supported  Standing balance-Leahy Scale Poor  Standing balance comment reliant on B UE support in static standing  OT - End of Session  Equipment Utilized During Treatment Gait belt;Rolling walker  Activity Tolerance Patient tolerated treatment well  Patient left in bed;with call bell/phone within reach;with bed alarm set  OT Assessment  OT Recommendation/Assessment Patient needs continued OT Services  OT Visit Diagnosis Other abnormalities of gait and mobility (R26.89);Unsteadiness on feet (R26.81);Pain;Other symptoms and signs involving cognitive function;Muscle weakness (generalized) (M62.81)  OT Problem List Decreased strength;Decreased activity tolerance;Impaired balance (sitting and/or standing);Decreased cognition;Decreased knowledge of use of DME or AE;Decreased knowledge of precautions;Pain;Obesity  Barriers to Discharge Decreased caregiver support  OT Plan  OT Frequency (ACUTE ONLY) Min 2X/week  OT Treatment/Interventions (ACUTE ONLY) Self-care/ADL training;DME and/or AE instruction;Cognitive remediation/compensation;Patient/family education;Balance training;Therapeutic activities  AM-PAC OT "6 Clicks" Daily Activity Outcome Measure  Help from another person eating meals? 4  Help from another person taking care of personal grooming? 3  Help from another person toileting, which includes using toliet, bedpan, or urinal? 1  Help from another person bathing (including washing, rinsing, drying)? 2  Help from another person to put on  and taking off regular upper body clothing? 2  Help from another person to put on and taking off regular lower body clothing? 1  6 Click Score 13  ADL G Code Conversion CL  OT Recommendation  Follow Up Recommendations SNF;Supervision/Assistance - 24 hour  Individuals Consulted  Consulted and Agree with Results and Recommendations Patient  Acute Rehab OT Goals  Patient Stated Goal did not state  OT Goal Formulation With patient  Time For Goal Achievement 04/14/17  Potential to Achieve Goals Good  OT Time Calculation  OT Start Time (ACUTE ONLY) 1005  OT Stop Time (ACUTE ONLY) 1040  OT Time Calculation (min) 35 min  OT General Charges  $OT Visit 1 Visit  OT Evaluation  $OT Eval Moderate Complexity 1 Mod  Written Expression  Dominant Hand Right  03/31/2017 Nestor Lewandowsky, OTR/L Pager: [phone]

## 2017-03-31 NOTE — Progress Notes (Addendum)
Neurosurgery Progress Note  Continues to have issues with confusion Note hemoglobin 6.9, attending notified by nursing PT reports patient ambulated to restroom  EXAM:  BP (!) 165/48 (BP Location: Right Arm)   Pulse (!) 54   Temp 98 F (36.7 C) (Oral)   Resp 18   Ht 5' (1.524 m)   Wt 98.4 kg (217 lb)   SpO2 100%   BMI 42.38 kg/m   Drowsy but easily awakened Oriented to name, date of birth 2 year is Norman she is in [place] but knows shes in a hospital MAEW with good strength Wound vac in place  IMPRESSION/PLAN 76 y.o. female with AMS. S/P lumbar Wound vac placement due to wound dehiscence  Gram stain without identifiable organism. Cultures pending.  [place] nursing to change wound vac as needed No new NS recs

## 2017-04-01 DIAGNOSIS — R339 Retention of urine, unspecified: Secondary | ICD-10-CM

## 2017-04-01 LAB — BASIC METABOLIC PANEL
Anion gap: 6 (ref 5–15)
BUN: 70 mg/dL — ABNORMAL HIGH (ref 6–20)
CHLORIDE: 111 mmol/L (ref 101–111)
CO2: 22 mmol/L (ref 22–32)
Calcium: 8.2 mg/dL — ABNORMAL LOW (ref 8.9–10.3)
Creatinine, Ser: 2.67 mg/dL — ABNORMAL HIGH (ref 0.44–1.00)
GFR calc Af Amer: 19 mL/min — ABNORMAL LOW (ref >60)
GFR calc non Af Amer: 17 mL/min — ABNORMAL LOW (ref >60)
Glucose, Bld: 97 mg/dL (ref 65–99)
Potassium: 3.8 mmol/L (ref 3.5–5.1)
SODIUM: 139 mmol/L (ref 135–145)

## 2017-04-01 LAB — GLUCOSE, CAPILLARY
GLUCOSE-CAPILLARY: 90 mg/dL (ref 65–99)
Glucose-Capillary: 107 mg/dL — ABNORMAL HIGH (ref 65–99)
Glucose-Capillary: 113 mg/dL — ABNORMAL HIGH (ref 65–99)
Glucose-Capillary: 127 mg/dL — ABNORMAL HIGH (ref 65–99)
Glucose-Capillary: 96 mg/dL (ref 65–99)

## 2017-04-01 LAB — CBC
HEMATOCRIT: 23.7 % — AB (ref 36.0–46.0)
HEMOGLOBIN: 7.8 g/dL — AB (ref 12.0–15.0)
MCH: 27.9 pg (ref 26.0–34.0)
MCHC: 32.9 g/dL (ref 30.0–36.0)
MCV: 84.6 fL (ref 78.0–100.0)
Platelets: 214 10*3/uL (ref 150–400)
RBC: 2.8 MIL/uL — AB (ref 3.87–5.11)
RDW: 15 % (ref 11.5–15.5)
WBC: 10.2 10*3/uL (ref 4.0–10.5)

## 2017-04-01 MED ORDER — CLONIDINE HCL 0.3 MG PO TABS
0.3000 mg | ORAL_TABLET | Freq: Two times a day (BID) | ORAL | Status: DC
Start: 2017-04-01 — End: 2017-04-07
  Administered 2017-04-01 – 2017-04-07 (×12): 0.3 mg via ORAL
  Filled 2017-04-01 (×12): qty 1

## 2017-04-01 MED ORDER — VANCOMYCIN HCL IN DEXTROSE 1-5 GM/200ML-% IV SOLN
1000.0000 mg | INTRAVENOUS | Status: DC
  Filled 2017-04-01: qty 200

## 2017-04-01 MED ORDER — PIPERACILLIN-TAZOBACTAM IN DEX 2-0.25 GM/50ML IV SOLN
2.2500 g | Freq: Three times a day (TID) | INTRAVENOUS | Status: DC
  Administered 2017-04-01 – 2017-04-02 (×4): 2.25 g via INTRAVENOUS
  Filled 2017-04-01 (×5): qty 50

## 2017-04-01 MED ORDER — POLYETHYLENE GLYCOL 3350 17 G PO PACK
17.0000 g | PACK | Freq: Every day | ORAL | Status: DC
  Administered 2017-04-01 – 2017-04-06 (×2): 17 g via ORAL
  Filled 2017-04-01 (×5): qty 1

## 2017-04-01 NOTE — Progress Notes (Signed)
Family Medicine Teaching Service Daily Progress Note Intern Pager: [phone]  Patient name: [name] record number: [id number] Date of birth: Oct 05, 1940 Age: 76 y.o. Gender: female  Primary Care Provider: Lenard Simmer, MD Consultants: Neurosurgery Code Status:Full, unable to ask patient as she is altered  Pt Overview and Major Events to Date:  Taylor Tyler a 76 y.o.femalepresenting with AMS, diarrhea and vomiting, also planning for spinal procedure in OR with neurosurgery 8/27. PMH is significant for T2DM, CKD 3, HTN, HLD, chronic anemia, hypothyroidism, HLD, and spinal stenosis s/p neurosurgery operations in 01/2017 and 02/2017   Assessment and Plan: AMS- Mildly improved from admission. Uncertain etiology. Possible intracranial infections from open back wound Proteus mirabilis , but remains unlikely given no growth on blood cultures, afebrile/no leukocytosis. Iiatrogenic cause possible, but less likely given reduced sedating meds since admission. Possible uremia given elevated Cr or 2.7. CVA not likely given Neg CT/MRI head negative for acute process. Severe dehydration with diarrhea was thought to be contributing, but pt has not inproved with IV and parentaral nutrition. TSH nl. back wound culture, Back wound culture few Proteus mirabilis, pan sensitive - monitor on telemetry - IV vanc/zosyn (08/29 -) restarted after surgery....dosed per pharm - neurosurgery planning to consult ID - limit sedating meds - q4H neuro checks - CBG monitoring TID/AC - Blood cultures, ngtd @ 2 days - Urine cultures, contained multiple species, likely contaminated, will reorder  Open back wound s/p neurosurgery - s/p L3-4 and L4-5 laminectomies with L4-5 Gill procedure, L5-S1 laminectomy with facetectomy, posterior segmental instrumented fusion L3-S1 on 02/26/2017. Wound culture Proteus mirabilis, pan sensitive  - Wound care appreciated - will follow up neurosurg recs - neurosurgery  planning to consult ID for culture  Diarrhea/vomiting- No diarrhea, emesis past 24 hrs. Endorsing bilat upper quad ab pain.  C diff negative in ED. May be fromdoxycycline vs other GI pathogen. Lipase mildly elevated at 72, however not likely pancreatitis given pancreas normal on CT abdomen and no abdominal tenderness to palpation. CT abdomen without acute abnormality. - hold doxycycline (IV abx as above) - Zofran PRN nausea - consider GI panel if AMS/abdominal pain worsens, or pt develops worsening abdominal pain/diarrhea/emesis  AKI on CKD III - mildly worse BUN/Cr 70/2.67, from baseline 1.4-1.8. Likely prerenal given recent diarrhea/vomiting/inability to tolerate PO. minimal urine output charted in past 24 hrs, despite IV LR, Bladder scan showed ~840 - foley catheter - monitor AM creatinine - holding nephrotoxic agents (home Lasix, HCTZ) - void trial for tomorrow  HTN - BP 155-169/48-79. HR 49-64. Home regimen includes coreg 25 mg BID, Clonidine 0.2 mg BID, lasix 20 mg daily, losartan 100 mg daily, hctz 25 mg daily, patient is allergic to hydralazine of "chest tightness". Of note, pt received one dose of 10mg  IV in ED w/o obvious signs of allergy or sideeffect Difficult mgmt of bp due to concern to give po, holding nephrotoxic agents due to aki, and allergy to hydralazine -need to discuss next [facility] treatment - increased clonidine,  - in order of preference of restarting bp meds, pending improved neuro status             -1. if cr improves, consider adding home lasix due to lower extremity edema, cont to hold losartan/HCTZ in  setting of  given acute kidney injury             -2. if hr stable, trial home coreg, w/ close monitoring of HR as pt was bradycardic on metoprolol -  hold  IV metoprolol due to bradycardia - hold hydralazine due to allergy, will ask family if they know more about allergy  LE edema, chronic - stable. uncertain etiology. 2+ on exam today. Nl lung exam. satting well  on RA. Patient had echo 01/16/2017 which showed EF 55-60%, moderate LVH, no regional wall motion abnormalities. Albumin low at 2.4 which may contribute. Takes PO Lasix 20 mg daily at home. BNP elevated at 492.  - holding lasix for AKI and NPO, consider adding back lasix if aki improves - cont to monitor  T2DM- well controlled; last A1c 5.7 on 02/16/2017. Home regimen is metformin 500 mg BID.  - hold home metformin with AKI and AMS - SSI  Anemia, acute on chronic- Up to Hgb 7.8 from 6.9 after surgery . Drop likely ABLA from neurosurgery. 9-10 baseline, though has been low at 7.8-8.0 for the last month after spinal surgery. CKD likely contributes to chronic. Home regimen includes Ferrous sulfate 325 mg daily. - hold home iron in setting of possible acute infection - discuss restarting home iron and transfusion threshold - cont to monitor   Hypothyroidism - TSH 0.984. On Synthyroid 100 mcg daily by 8/1 note.  - cont. Home dose  HLD- last lipid panel apparently not available in epic. Home regimen Atorva 40 mg QHS.  - continue statin  FEN/GI: Carb Modified Prophylaxis: heparin given kidney injury and back procedure planned for 8/27  Disposition:Telemetry.   Subjective:  Asleep in bed, but rousable. Able to follow commands. Does not endorse any complaints, but complains of belly pain when pressing on abdomin  Objective: Temp:  [97.7 F (36.5 C)-98.4 F (36.9 C)] 97.9 F (36.6 C) (08/29 0540) Pulse Rate:  [49-64] 52 (08/29 0540) Resp:  [16-18] 18 (08/29 0522) BP: (155-169)/(48-79) 162/69 (08/29 0540) SpO2:  [100 %] 100 % (08/29 0540) Physical Exam: General: alseep, but rousable, cooperative Cardiovascular: rrr, systolic murmur Respiratory: ctab, no increased work of breathing Abdomen: bilateral upper quadrant ttp, nondistended Extremities:2+ lower extremity edema, 2+ dp pulses Neuro: intention tremor in arms  Laboratory:  Recent Labs Lab 03/30/17 1818 03/31/17 0757  04/01/17 0333  WBC 9.4 10.4 10.2  HGB 7.2* 6.9* 7.8*  HCT 23.2* 21.9* 23.7*  PLT 278 252 214    Recent Labs Lab 03/29/17 1323 03/30/17 1818 04/01/17 0333  NA 137 144 139  K 4.3 3.8 3.8  CL 105 115* 111  CO2 21* 22 22  BUN 87* 63* 70*  CREATININE 2.87* 2.31* 2.67*  CALCIUM 8.7* 8.3* 8.2*  PROT 6.5 5.8*  --   BILITOT 0.6 0.6  --   ALKPHOS 90 82  --   ALT 18 18  --   AST 28 29  --   GLUCOSE 118* 155* 97      Imaging/Diagnostic Tests: Ct Abdomen Pelvis Wo Contrast  Result Date: 03/29/2017 CLINICAL DATA:  Nausea, vomiting and weakness for 1 week. Altered mental status. Back surgery 1 month ago. EXAM: CT ABDOMEN AND PELVIS WITHOUT CONTRAST TECHNIQUE: Multidetector CT imaging of the abdomen and pelvis was performed following the standard protocol without IV contrast. COMPARISON:  Abdominopelvic CT 07/07/2016. Intraoperative lumbar radiographs 02/26/2017. FINDINGS: Lower chest: There is stable subpleural lipoma at the right lung base on image 8. The lung bases are otherwise clear. No significant pleural or pericardial effusion. The heart is mildly enlarged. Hepatobiliary: Progressive diffuse hepatic steatosis. No focal abnormalities are seen on noncontrast imaging. No significant biliary dilatation status post cholecystectomy. Pancreas: Unremarkable. No pancreatic ductal dilatation  or surrounding inflammatory changes. Spleen: Normal in size without focal abnormality. Adrenals/Urinary Tract: Both adrenal glands appear normal. Stable small nonobstructing calculus in the lower pole of the left kidney. No other urinary tract calculi are demonstrated. There is stable mild renal cortical thinning bilaterally. Chronic perinephric soft tissue stranding has improved. There is no hydronephrosis. The bladder appears unremarkable. Stomach/Bowel: No evidence of bowel wall thickening, distention or surrounding inflammatory change. The distal colonic anastomosis appears patent status post partial  colectomy. There are diverticular changes throughout the colon. Broad-based ventral hernia containing transverse colon and multiple loops of small bowel is unchanged. No evidence of incarceration or obstruction. Vascular/Lymphatic: There are no enlarged abdominal or pelvic lymph nodes. Aortic and branch vessel atherosclerosis noted. Reproductive: Hysterectomy. No evidence of adnexal mass. Probable pelvic floor laxity. Other: As above, large broad-based ventral hernia, grossly stable. No ascites or free air. Musculoskeletal: No acute or significant osseous findings. Interval revision of lower lumbar fusion. The patient is now fused from L3 through S1. The hardware appears intact. There is an open midline wound superiorly with apparent packing material and surrounding soft tissue emphysema. There is no large paraspinal fluid collection. No bone destruction identified. IMPRESSION: 1. No acute abdominopelvic findings identified. 2. Stable large ventral hernia without evidence of incarceration or obstruction. 3. Interval revision of lumbar fusion. There is an open wound containing apparent packing material. 4. Diffuse hepatic steatosis. 5.  Aortic Atherosclerosis (ICD10-I70.0). Electronically Signed   By: Richardean Sale M.D.   On: 03/29/2017 16:13   Ct Head Wo Contrast  Result Date: 03/29/2017 CLINICAL DATA:  Nausea, vomiting, and weakness for 1 week. EXAM: CT HEAD WITHOUT CONTRAST TECHNIQUE: Contiguous axial images were obtained from the base of the skull through the vertex without intravenous contrast. COMPARISON:  April 21, 2014 FINDINGS: Brain: No subdural, epidural, or subarachnoid hemorrhage identified on today's study. Cerebellum, brainstem, and basal cisterns are within normal limits. No mass effect or midline shift. Ventricles and sulci are unremarkable. No acute cortical ischemia or infarct. Vascular: Calcified atherosclerosis is seen in the intracranial carotid arteries. Skull: Normal. Negative for  fracture or focal lesion. Sinuses/Orbits: There is a small amount of fluid or mucus in the sphenoid sinuses. This is similar compared to the previous study. Paranasal sinuses, mastoid air cells, and middle ears are otherwise normal. Other: There is a stable subcutaneous nodule in the posterior scalp on series 3, image 9 of no acute significance. Extracranial soft tissues are otherwise normal. IMPRESSION: 1. No acute intracranial abnormality identified. 2. Mild sinus disease as above. Electronically Signed   By: Dorise Bullion III M.D   On: 03/29/2017 16:05   Mr Brain Wo Contrast  Result Date: 03/30/2017 CLINICAL DATA:  Altered mental status EXAM: MRI HEAD WITHOUT CONTRAST TECHNIQUE: Multiplanar, multiecho pulse sequences of the brain and surrounding structures were obtained without intravenous contrast. COMPARISON:  Head CT 03/29/2017 FINDINGS: Brain: The midline structures are normal. There is no focal diffusion restriction to indicate acute infarct. There is mild multifocal leukoaraiosis, most commonly seen in the setting of chronic hypertensive microangiopathy. No intraparenchymal hematoma or chronic microhemorrhage. Brain volume is normal for age without lobar predominant atrophy. The dura is normal and there is no extra-axial collection. Vascular: Major intracranial arterial and venous sinus flow voids are preserved. Skull and upper cervical spine: The visualized skull base, calvarium, upper cervical spine and extracranial soft tissues are normal. Sinuses/Orbits: No fluid levels or advanced mucosal thickening. No mastoid or middle ear effusion. Normal orbits. IMPRESSION: Mild  sequelae of chronic hypertensive microangiopathy. No acute abnormality. Electronically Signed   By: Ulyses Jarred M.D.   On: 03/30/2017 06:14   Dg Chest Port 1 View  Result Date: 03/29/2017 CLINICAL DATA:  76 year old female with altered mental status. Nausea vomiting and weakness for 1 week. EXAM: PORTABLE CHEST 1 VIEW  COMPARISON:  Portable chest 02/26/2017 and earlier. FINDINGS: Portable AP semi upright view at 2129 hours. Stable lung volumes. Mild cardiomegaly. Other mediastinal contours are within normal limits. Allowing for portable technique the lungs are clear. No pneumothorax or pneumoperitoneum. Visible epigastric bowel gas pattern within normal limits. IMPRESSION: No acute cardiopulmonary abnormality. Electronically Signed   By: Genevie Ann M.D.   On: 03/29/2017 21:56     Bonnita Hollow, MD 04/01/2017, 6:25 AM PGY-1, [facility] Intern pager: [phone], text pages welcome

## 2017-04-01 NOTE — Progress Notes (Signed)
Foley catheter placed per MD order.  950 mL clear, dark, yellow urine return.  Patient tolerated well.

## 2017-04-01 NOTE — Consult Note (Addendum)
[place] Nurse wound follow up Wound type: Back Vac dressing changed. Pt was in a large amt pain despite meds given earlier. Full thickness post-op wound to middle back Measurement: 18X8X4cm Wound bed: beefy red Drainage (amount, consistency, odor) small amt blood-tinged drainage Periwound: Intact skin surrounding Dressing procedure/placement/frequency: Applied nonadherent contact layer, then one piece black foam to 175mm cont suction.  Bridged track pad to flank to decrease pressure.  [place] changed. One hour spent attempting to obtain a seal to the Vac drape and dressing. Plan to change dressing on Fri if patient is still here at that time. Julien Girt MSN, RN, [place], [place], [place]

## 2017-04-01 NOTE — Progress Notes (Signed)
Pharmacy Antibiotic Note  Taylor Tyler is a 76 y.o. female admitted on 03/29/2017 with AMS and concern for sepsis.  Pharmacy has been consulted for Vancomycin and Zosyn dosing.  The patient was noted to have AKI on admission - with fluctuating SCr - back up today requiring antibiotic dose adjustments. SCr 2.67 << 2.31, CrCl~15-20 ml/min.    It is noted that the patient underwent an I&D for the potential source of a lumbar wound infection given the patient's open back wound. Cultures are now growing proteus and holding for anaerobe. Per neurosurgery note - plan is to consult ID. Antibiotics will likely be de-escalated and narrowed at that time, potentially to something like Unasyn.  Plan: 1. Adjust Vancomycin to 1g IV every 48 hours 2. Adjust Zosyn to 2.25g IV every 8 hours 3. Will follow-up plans when ID rounds for narrowing 4. Will continue to follow renal function, culture results, LOT, and antibiotic de-escalation plans   Height: 5' (152.4 cm) Weight: 217 lb (98.4 kg) IBW/kg (Calculated) : 45.5  Temp (24hrs), Avg:97.9 F (36.6 C), Min:97.7 F (36.5 C), Max:98.4 F (36.9 C)   Recent Labs Lab 03/29/17 1323 03/29/17 1329 03/30/17 1818 03/31/17 0757 04/01/17 0333  WBC 6.9  --  9.4 10.4 10.2  CREATININE 2.87*  --  2.31*  --  2.67*  LATICACIDVEN  --  1.17  --   --   --     Estimated Creatinine Clearance: 19.5 mL/min (A) (by C-G formula based on SCr of 2.67 mg/dL (H)).    Allergies  Allergen Reactions  . Clams [Shellfish Allergy] Swelling and Other (See Comments)    THROAT SWELLS NECK TURNS RED  . Hydralazine Other (See Comments)    CHEST TIGHTNESS    Antimicrobials this admission: Doxy PTA 8/26 Vanc >> 8/26 Zosyn >>  Microbiology results: 8/26 CDiff >> neg 8/26 BCx >> ngtd 8/26 UCx >> mult species 8/27 WCx >> ngtd  Thank you for allowing pharmacy to be a part of this patient's care.  Alycia Rossetti, PharmD, BCPS Clinical Pharmacist Pager: [phone] Clinical  phone for 04/01/2017 from 7a-3:30p: [phone] If after 3:30p, please call main pharmacy at: [phone] 04/01/2017 10:25 AM

## 2017-04-01 NOTE — Progress Notes (Signed)
Neurosurgery Progress Note  No NS issues over night Patient currently getting a catheter  EXAM:  BP (!) 196/97   Pulse (!) 49   Temp 98 F (36.7 C) (Oral)   Resp 16   Ht 5' (1.524 m)   Wt 98.4 kg (217 lb)   SpO2 100%   BMI 42.38 kg/m   Unable to perform exam due to currently undergoing cath placement  IMPRESSION/PLAN 76 y.o. female with AMS.  S/P lumbar Wound vac placement due to wound dehiscence  Gram stain without identifiable organism. Prelim cultures show proteus. Susceptibilities pending. Will consult ID. [place] nursing to change wound vac as needed

## 2017-04-02 DIAGNOSIS — E785 Hyperlipidemia, unspecified: Secondary | ICD-10-CM

## 2017-04-02 DIAGNOSIS — E1122 Type 2 diabetes mellitus with diabetic chronic kidney disease: Secondary | ICD-10-CM

## 2017-04-02 DIAGNOSIS — T8149XA Infection following a procedure, other surgical site, initial encounter: Secondary | ICD-10-CM

## 2017-04-02 DIAGNOSIS — I129 Hypertensive chronic kidney disease with stage 1 through stage 4 chronic kidney disease, or unspecified chronic kidney disease: Secondary | ICD-10-CM

## 2017-04-02 DIAGNOSIS — N183 Chronic kidney disease, stage 3 (moderate): Secondary | ICD-10-CM

## 2017-04-02 DIAGNOSIS — T814XXA Infection following a procedure, initial encounter: Secondary | ICD-10-CM

## 2017-04-02 DIAGNOSIS — E039 Hypothyroidism, unspecified: Secondary | ICD-10-CM

## 2017-04-02 DIAGNOSIS — Y838 Other surgical procedures as the cause of abnormal reaction of the patient, or of later complication, without mention of misadventure at the time of the procedure: Secondary | ICD-10-CM

## 2017-04-02 DIAGNOSIS — Z9889 Other specified postprocedural states: Secondary | ICD-10-CM

## 2017-04-02 HISTORY — DX: Infection following a procedure, other surgical site, initial encounter: T81.49XA

## 2017-04-02 LAB — BPAM RBC
BLOOD PRODUCT EXPIRATION DATE: 201809202359
Blood Product Expiration Date: 201809042359
Blood Product Expiration Date: 201809042359
ISSUE DATE / TIME: 201808290510
ISSUE DATE / TIME: 201808291534
ISSUE DATE / TIME: 201808281315
UNIT TYPE AND RH: 6200
UNIT TYPE AND RH: 6200
UNIT TYPE AND RH: 6200

## 2017-04-02 LAB — TYPE AND SCREEN
ABO/RH(D): A POS
Antibody Screen: NEGATIVE
UNIT DIVISION: 0
UNIT DIVISION: 0
Unit division: 0

## 2017-04-02 LAB — GLUCOSE, CAPILLARY
GLUCOSE-CAPILLARY: 125 mg/dL — AB (ref 65–99)
GLUCOSE-CAPILLARY: 320 mg/dL — AB (ref 65–99)
GLUCOSE-CAPILLARY: 65 mg/dL (ref 65–99)
Glucose-Capillary: 97 mg/dL (ref 65–99)
Glucose-Capillary: 84 mg/dL (ref 65–99)

## 2017-04-02 LAB — CBC
HCT: 32 % — ABNORMAL LOW (ref 36.0–46.0)
Hemoglobin: 10.4 g/dL — ABNORMAL LOW (ref 12.0–15.0)
MCH: 27.4 pg (ref 26.0–34.0)
MCHC: 32.5 g/dL (ref 30.0–36.0)
MCV: 84.4 fL (ref 78.0–100.0)
PLATELETS: 215 10*3/uL (ref 150–400)
RBC: 3.79 MIL/uL — ABNORMAL LOW (ref 3.87–5.11)
RDW: 15.5 % (ref 11.5–15.5)
WBC: 11.8 10*3/uL — ABNORMAL HIGH (ref 4.0–10.5)

## 2017-04-02 LAB — BASIC METABOLIC PANEL
Anion gap: 8 (ref 5–15)
BUN: 64 mg/dL — ABNORMAL HIGH (ref 6–20)
CALCIUM: 8.2 mg/dL — AB (ref 8.9–10.3)
CHLORIDE: 113 mmol/L — AB (ref 101–111)
CO2: 19 mmol/L — AB (ref 22–32)
CREATININE: 2.47 mg/dL — AB (ref 0.44–1.00)
GFR calc non Af Amer: 18 mL/min — ABNORMAL LOW (ref >60)
GFR, EST AFRICAN AMERICAN: 21 mL/min — AB (ref >60)
Glucose, Bld: 81 mg/dL (ref 65–99)
Potassium: 4 mmol/L (ref 3.5–5.1)
SODIUM: 140 mmol/L (ref 135–145)

## 2017-04-02 LAB — URINE CULTURE: Culture: NO GROWTH

## 2017-04-02 MED ORDER — DEXTROSE 5 % IV SOLN
2.0000 g | Freq: Two times a day (BID) | INTRAVENOUS | Status: DC
  Administered 2017-04-02 – 2017-04-03 (×2): 2 g via INTRAVENOUS
  Filled 2017-04-02 (×3): qty 2

## 2017-04-02 NOTE — Care Management Important Message (Signed)
Important Message  Patient Details  Name: Taylor Tyler MRN: [id number] Date of Birth: 08/13/1940   Medicare Important Message Given:  Yes    Nathen May 04/02/2017, 9:21 AM

## 2017-04-02 NOTE — Progress Notes (Signed)
Family Medicine Teaching Service Daily Progress Note Intern Pager: [phone]  Patient name: Taylor Tyler record number: [id number] Date of birth: Sep 11, 1940 Age: 76 y.o. Gender: female  Primary Care Provider: Lenard Simmer, MD Consultants: Neurosurgery Code Status:Full, unable to ask patient as she is altered  Pt Overview and Major Events to Date:  Taylor Tyler a 76 y.o.femalepresenting with AMS, diarrhea and vomiting, also planning for spinal procedure in OR with neurosurgery 8/27. PMH is significant for T2DM, CKD 3, HTN, HLD, chronic anemia, hypothyroidism, HLD, and spinal stenosis s/p neurosurgery operations in 01/2017 and 02/2017   Assessment and Plan: AMS- Mildly improved from admission. Uncertain etiology. Possible intracranial infections from open back wound Proteus mirabilis , but remains unlikely given no growth on blood cultures, afebrile/no leukocytosis. Iiatrogenic cause possible, but less likely given reduced sedating meds since admission. Possible uremia given elevated Cr or 2.7. CVA not likely given Neg CT/MRI head negative for acute process. Severe dehydration with diarrhea was thought to be contributing, but pt has not inproved with IV and parentaral nutrition. TSH nl. Back wound culture few Proteus mirabilis, pan sensitive - monitor on telemetry - IV vanc/zosyn (08/29 -) restarted after surgery....dosed per pharm - neurosurgery recs - ID consult appreciated - limit sedating meds - q4H neuro checks - CBG monitoring TID/AC - Blood cultures, ngtd @ 2 days - Urine cultures, contained multiple species, likely contaminated, will reorder  Open back wound s/p neurosurgery - s/p L3-4 and L4-5 laminectomies with L4-5 Gill procedure, L5-S1 laminectomy with facetectomy, posterior segmental instrumented fusion L3-S1 on 02/26/2017. Wound culture Proteus mirabilis, pan sensitive  - Wound care appreciated - will follow up neurosurg recs - ID consult  appreciated  Diarrhea/vomiting- No diarrhea, emesis past 24 hrs. Endorsing bilat upper quad ab pain.  C diff negative in ED. May be fromdoxycycline vs other GI pathogen. Lipase mildly elevated at 72, however not likely pancreatitis given pancreas normal on CT abdomen and no abdominal tenderness to palpation. CT abdomen without acute abnormality. - hold doxycycline (IV abx as above) - Zofran PRN nausea - consider GI panel if AMS/abdominal pain worsens, or pt develops worsening abdominal pain/diarrhea/emesis  AKI on CKD III - mildly improved BUN/Cr 64/2.47, from baseline 1.4-1.8. Likely prerenal given recent diarrhea/vomiting/inability to tolerate PO. UOP improved w/ foley, 1748mL past 24 hrs.  - d/c foley catheter, void trial today - monitor AM creatinine - holding nephrotoxic agents (home Lasix, HCTZ)  HTN - BP 159-198/44-97. HR 49-59. Home regimen includes coreg 25 mg BID, Clonidine 0.2 mg BID, lasix 20 mg daily, losartan 100 mg daily, hctz 25 mg daily, patient is allergic to hydralazine of "chest tightness". Of note, pt received one dose of 10mg  IV in ED w/o obvious signs of allergy or side effect.  Holding nephrotoxic agents due to aki, and allergy to hydralazine -need to discuss next [facility] treatment - taking 0.3 mg clonididine, will not increase clonidine, due to bradycardia - in order of preference of restarting bp meds, pending improved neuro status             -1. if cr improves, consider adding home lasix due to lower extremity edema, cont to hold losartan/HCTZ in  setting of  given acute kidney injury             -2. if hr stable, trial home coreg, w/ close monitoring of HR as pt was bradycardic on metoprolol - hold betablockers due to bradycardia - hold hydralazine due to allergy, will ask  family if they know more about allergy  LE edema, chronic - stable. uncertain etiology. 2+ on exam today. Nl lung exam. satting well on RA. Patient had echo 01/16/2017 which showed EF 55-60%,  moderate LVH, no regional wall motion abnormalities. Albumin low at 2.4 which may contribute. Takes PO Lasix 20 mg daily at home. BNP elevated at 492.  - holding lasix for AKI and NPO, consider adding back lasix if aki improves - cont to monitor  T2DM- well controlled; last A1c 5.7 on 02/16/2017. Home regimen is metformin 500 mg BID.  - hold home metformin with AKI and AMS - SSI  Anemia, acute on chronic- Improved 10.8 on 08/30.  9-10 baseline, though has been low at 7.8-8.0 for the last month after spinal surgery. CKD likely contributes to chronic. Home regimen includes Ferrous sulfate 325 mg daily. S/p 2 units rbc on 04/01/17. - hold home iron in setting of possible acute infection - discuss restarting home iron and transfusion threshold - cont to monitor   Hypothyroidism - TSH 0.984. On Synthyroid 100 mcg daily by 8/1 note.  - cont. Home dose  HLD- last lipid panel apparently not available in epic. Home regimen Atorva 40 mg QHS.  - continue statin  FEN/GI: Carb Modified Prophylaxis: heparin given kidney injury and back procedure planned for 8/27  Disposition:Telemetry.   Subjective:  Asleep in bed, but rousable. Able to follow commands. Does not endorse any complaints, but complains of belly pain when pressing on abdomin  Objective: Temp:  [98 F (36.7 C)-98.7 F (37.1 C)] 98 F (36.7 C) (08/30 0421) Pulse Rate:  [49-59] 49 (08/30 0421) Resp:  [15-20] 15 (08/30 0421) BP: (159-198)/(44-97) 172/44 (08/30 0421) SpO2:  [97 %-100 %] 97 % (08/30 0421) Weight:  [216 lb 14.9 oz (98.4 kg)] 216 lb 14.9 oz (98.4 kg) (08/29 2106) Physical Exam: General: alseep, but rousable, cooperative Cardiovascular: rrr, systolic murmur Respiratory: ctab, no increased work of breathing Abdomen: bilateral upper quadrant ttp, nondistended Extremities:2+ lower extremity edema, 2+ dp pulses Neuro: intention tremor in arms  Laboratory:  Recent Labs Lab 03/31/17 0757 04/01/17 0333  04/02/17 0405  WBC 10.4 10.2 11.8*  HGB 6.9* 7.8* 10.4*  HCT 21.9* 23.7* 32.0*  PLT 252 214 215    Recent Labs Lab 03/29/17 1323 03/30/17 1818 04/01/17 0333 04/02/17 0405  NA 137 144 139 140  K 4.3 3.8 3.8 4.0  CL 105 115* 111 113*  CO2 21* 22 22 19*  BUN 87* 63* 70* 64*  CREATININE 2.87* 2.31* 2.67* 2.47*  CALCIUM 8.7* 8.3* 8.2* 8.2*  PROT 6.5 5.8*  --   --   BILITOT 0.6 0.6  --   --   ALKPHOS 90 82  --   --   ALT 18 18  --   --   AST 28 29  --   --   GLUCOSE 118* 155* 97 81      Imaging/Diagnostic Tests: Ct Abdomen Pelvis Wo Contrast  Result Date: 03/29/2017 CLINICAL DATA:  Nausea, vomiting and weakness for 1 week. Altered mental status. Back surgery 1 month ago. EXAM: CT ABDOMEN AND PELVIS WITHOUT CONTRAST TECHNIQUE: Multidetector CT imaging of the abdomen and pelvis was performed following the standard protocol without IV contrast. COMPARISON:  Abdominopelvic CT 07/07/2016. Intraoperative lumbar radiographs 02/26/2017. FINDINGS: Lower chest: There is stable subpleural lipoma at the right lung base on image 8. The lung bases are otherwise clear. No significant pleural or pericardial effusion. The heart is mildly enlarged. Hepatobiliary: Progressive  diffuse hepatic steatosis. No focal abnormalities are seen on noncontrast imaging. No significant biliary dilatation status post cholecystectomy. Pancreas: Unremarkable. No pancreatic ductal dilatation or surrounding inflammatory changes. Spleen: Normal in size without focal abnormality. Adrenals/Urinary Tract: Both adrenal glands appear normal. Stable small nonobstructing calculus in the lower pole of the left kidney. No other urinary tract calculi are demonstrated. There is stable mild renal cortical thinning bilaterally. Chronic perinephric soft tissue stranding has improved. There is no hydronephrosis. The bladder appears unremarkable. Stomach/Bowel: No evidence of bowel wall thickening, distention or surrounding inflammatory  change. The distal colonic anastomosis appears patent status post partial colectomy. There are diverticular changes throughout the colon. Broad-based ventral hernia containing transverse colon and multiple loops of small bowel is unchanged. No evidence of incarceration or obstruction. Vascular/Lymphatic: There are no enlarged abdominal or pelvic lymph nodes. Aortic and branch vessel atherosclerosis noted. Reproductive: Hysterectomy. No evidence of adnexal mass. Probable pelvic floor laxity. Other: As above, large broad-based ventral hernia, grossly stable. No ascites or free air. Musculoskeletal: No acute or significant osseous findings. Interval revision of lower lumbar fusion. The patient is now fused from L3 through S1. The hardware appears intact. There is an open midline wound superiorly with apparent packing material and surrounding soft tissue emphysema. There is no large paraspinal fluid collection. No bone destruction identified. IMPRESSION: 1. No acute abdominopelvic findings identified. 2. Stable large ventral hernia without evidence of incarceration or obstruction. 3. Interval revision of lumbar fusion. There is an open wound containing apparent packing material. 4. Diffuse hepatic steatosis. 5.  Aortic Atherosclerosis (ICD10-I70.0). Electronically Signed   By: Richardean Sale M.D.   On: 03/29/2017 16:13   Ct Head Wo Contrast  Result Date: 03/29/2017 CLINICAL DATA:  Nausea, vomiting, and weakness for 1 week. EXAM: CT HEAD WITHOUT CONTRAST TECHNIQUE: Contiguous axial images were obtained from the base of the skull through the vertex without intravenous contrast. COMPARISON:  April 21, 2014 FINDINGS: Brain: No subdural, epidural, or subarachnoid hemorrhage identified on today's study. Cerebellum, brainstem, and basal cisterns are within normal limits. No mass effect or midline shift. Ventricles and sulci are unremarkable. No acute cortical ischemia or infarct. Vascular: Calcified atherosclerosis is  seen in the intracranial carotid arteries. Skull: Normal. Negative for fracture or focal lesion. Sinuses/Orbits: There is a small amount of fluid or mucus in the sphenoid sinuses. This is similar compared to the previous study. Paranasal sinuses, mastoid air cells, and middle ears are otherwise normal. Other: There is a stable subcutaneous nodule in the posterior scalp on series 3, image 9 of no acute significance. Extracranial soft tissues are otherwise normal. IMPRESSION: 1. No acute intracranial abnormality identified. 2. Mild sinus disease as above. Electronically Signed   By: Dorise Bullion III M.D   On: 03/29/2017 16:05   Mr Brain Wo Contrast  Result Date: 03/30/2017 CLINICAL DATA:  Altered mental status EXAM: MRI HEAD WITHOUT CONTRAST TECHNIQUE: Multiplanar, multiecho pulse sequences of the brain and surrounding structures were obtained without intravenous contrast. COMPARISON:  Head CT 03/29/2017 FINDINGS: Brain: The midline structures are normal. There is no focal diffusion restriction to indicate acute infarct. There is mild multifocal leukoaraiosis, most commonly seen in the setting of chronic hypertensive microangiopathy. No intraparenchymal hematoma or chronic microhemorrhage. Brain volume is normal for age without lobar predominant atrophy. The dura is normal and there is no extra-axial collection. Vascular: Major intracranial arterial and venous sinus flow voids are preserved. Skull and upper cervical spine: The visualized skull base, calvarium, upper cervical spine  and extracranial soft tissues are normal. Sinuses/Orbits: No fluid levels or advanced mucosal thickening. No mastoid or middle ear effusion. Normal orbits. IMPRESSION: Mild sequelae of chronic hypertensive microangiopathy. No acute abnormality. Electronically Signed   By: Ulyses Jarred M.D.   On: 03/30/2017 06:14   Dg Chest Port 1 View  Result Date: 03/29/2017 CLINICAL DATA:  76 year old female with altered mental status. Nausea  vomiting and weakness for 1 week. EXAM: PORTABLE CHEST 1 VIEW COMPARISON:  Portable chest 02/26/2017 and earlier. FINDINGS: Portable AP semi upright view at 2129 hours. Stable lung volumes. Mild cardiomegaly. Other mediastinal contours are within normal limits. Allowing for portable technique the lungs are clear. No pneumothorax or pneumoperitoneum. Visible epigastric bowel gas pattern within normal limits. IMPRESSION: No acute cardiopulmonary abnormality. Electronically Signed   By: Genevie Ann M.D.   On: 03/29/2017 21:56     Bonnita Hollow, MD 04/02/2017, 6:47 AM PGY-1, [place] Intern pager: [phone], text pages welcome

## 2017-04-02 NOTE — Progress Notes (Signed)
Patient is noticeably weaker and has tremors/jerky movements. Unable to hold cup or utensils to feed self. MD made aware.

## 2017-04-02 NOTE — Consult Note (Signed)
[place] Nurse wound follow up Wound type:see note below Measurement: Wound bed: Drainage (amount, consistency, odor)  Periwound: Dressing procedure/placement/frequency: Consulted for linear wound on right lower back.  I assessed patients back and found no wound.  I assessed left side also in case wrong side was charted.  I did not find a wound on either side.  Julien Girt, WOC will be seeing pt for wound vac change tomorrow am and I will have her reassess. We will continue to follow for wound vac changes MWF.   Fara Olden, RN-C, WTA-C, [facility] Wound Treatment Associate

## 2017-04-02 NOTE — Progress Notes (Signed)
PT Cancellation Note  Patient Details Name: Taylor Tyler MRN: [id number] DOB: 1940/11/16   Cancelled Treatment:    Reason Eval/Treat Not Completed: Fatigue/lethargy limiting ability to participate;Patient's level of consciousness.  Pt is unable to speak to PT and when PT talked with nursing, more information about elevated BS and ongoing sleepiness with tremors was provided.  OT had difficulty due to lethargy this AM, and note to MD was left.   Ramond Dial 04/02/2017, 5:04 PM   Mee Hives, PT MS Acute Rehab Dept. Number: [place] and [place]

## 2017-04-02 NOTE — NC FL2 (Signed)
[place] LEVEL OF CARE SCREENING TOOL     IDENTIFICATION  Patient Name: Taylor Tyler Birthdate: Jan 02, 1941 Sex: female Admission Date (Current Location): 03/29/2017  [facility] and [place] Number:  Herbalist and Address:  [facility] [place], [place] [address], [place], [place] [address]      Provider Number: [id number]  Attending Physician Name and Address:  Dickie La, MD  Relative Name and Phone Number:  Lucendia, Leard [phone]; Mellody Masri, son - [phone]    Current Level of Care: Hospital Recommended Level of Care: [place] (From [place]) Prior Approval Number:    Date Approved/Denied:   PASRR Number: [id number]  Discharge Plan: SNF    Current Diagnoses: Patient Active Problem List   Diagnosis Date Noted  . Postoperative wound infection 04/02/2017  . Urinary retention   . Pressure injury of skin 03/30/2017  . Wound dehiscence   . Diarrhea   . Altered mental status 03/29/2017  . Anemia 03/14/2017  . Acute blood loss anemia 03/14/2017  . Acute renal failure superimposed on stage 3 chronic kidney disease ([place]) 03/14/2017  . Bilateral lower extremity edema 03/04/2017  . AKI (acute kidney injury) ([place]) 02/28/2017  . Lethargy 02/28/2017  . Obesity, Class III, BMI 40-49.9 (morbid obesity) ([place]) 02/27/2017  . DM (diabetes mellitus), type 2 with renal complications ([place]) 70/35/0093  . Status post lumbar surgery 02/24/2017  . Hypertensive emergency 01/15/2017  . CKD (chronic kidney disease) stage 3, GFR 30-59 ml/min 01/15/2017  . Hypothyroidism 01/15/2017  . Diverticulosis 01/15/2017  . HLD (hyperlipidemia) 01/15/2017  . Spinal stenosis of lumbar region 01/15/2017  . Disease of pancreas 07/21/2012    Orientation RESPIRATION BLADDER Height & Weight     Self  Normal Indwelling catheter Weight: 216 lb 14.9 oz (98.4 kg) Height:  5' (152.4 cm)  BEHAVIORAL SYMPTOMS/MOOD NEUROLOGICAL  BOWEL NUTRITION STATUS      Continent Diet (Carb modified)  AMBULATORY STATUS COMMUNICATION OF NEEDS Skin   Limited Assist (Walked 20 feet with +2 assist)   Other (Comment) (MASD to buttocks & groin; Excoriated right/left groin; skin tear to right buttocks with thin film dressing; Unstageable pressure injury to medical buttocks; Back vac for middle back wounds requiring dressing changes)                       Personal Care Assistance Level of Assistance  Bathing, Feeding, Dressing Bathing Assistance: Maximum assistance (Upper body-mod and Lower body total assist) Feeding assistance: Limited assistance Dressing Assistance: Maximum assistance     Functional Limitations Info  Sight, Hearing, Speech Sight Info: Adequate Hearing Info: Adequate Speech Info: Adequate    SPECIAL CARE FACTORS FREQUENCY  PT (By licensed PT), OT (By licensed OT)     PT Frequency: Evaluated 8/28 and a minimum of 3X per week therapy recommended OT Frequency: Evaluated 8/28 and a minimum of 2X per week therapy recommended              Contractures Contractures Info: Not present    Additional Factors Info  Code Status, Allergies Code Status Info: Full Allergies Info: Clams, Hydralazine           Current Medications (04/02/2017):  This is the current hospital active medication list Current Facility-Administered Medications  Medication Dose Route Frequency Provider Last Rate Last Dose  . 0.9 %  sodium chloride infusion  250 mL Intravenous Continuous Ditty, Kevan Ny, MD      .  0.9 %  sodium chloride infusion   Intravenous Once Carlyle Dolly, MD      . acetaminophen (TYLENOL) tablet 1,000 mg  1,000 mg Oral TID Everrett Coombe, MD   1,000 mg at 04/02/17 1057  . alum & mag hydroxide-simeth (MAALOX/MYLANTA) 200-200-20 MG/5ML suspension 30 mL  30 mL Oral Q6H PRN Ditty, Kevan Ny, MD      . atorvastatin (LIPITOR) tablet 40 mg  40 mg Oral QPM Everrett Coombe, MD   40 mg at 04/01/17 1702  .  baclofen (LIORESAL) tablet 5 mg  5 mg Oral BID PRN Everrett Coombe, MD      . bisacodyl (DULCOLAX) suppository 10 mg  10 mg Rectal Daily PRN Ditty, Kevan Ny, MD      . Chlorhexidine Gluconate Cloth 2 % PADS 6 each  6 each Topical Once Ditty, Kevan Ny, MD      . cloNIDine (CATAPRES) tablet 0.3 mg  0.3 mg Oral BID Everrett Coombe, MD   0.3 mg at 04/02/17 1056  . collagenase (SANTYL) ointment   Topical Daily Dickie La, MD      . docusate sodium (COLACE) capsule 100 mg  100 mg Oral BID Ditty, Kevan Ny, MD   100 mg at 04/01/17 2117  . gabapentin (NEURONTIN) capsule 300 mg  300 mg Oral TID Ditty, Kevan Ny, MD   300 mg at 04/02/17 1056  . heparin injection 5,000 Units  5,000 Units Subcutaneous Q8H Everrett Coombe, MD   5,000 Units at 04/02/17 1320  . insulin aspart (novoLOG) injection 0-9 Units  0-9 Units Subcutaneous TID WC Everrett Coombe, MD   1 Units at 04/02/17 1304  . lactated ringers infusion   Intravenous Continuous Oleta Mouse, MD 50 mL/hr at 04/01/17 1255    . levothyroxine (SYNTHROID, LEVOTHROID) tablet 88 mcg  88 mcg Oral QAC breakfast Sherene Sires, DO   88 mcg at 04/02/17 0905  . menthol-cetylpyridinium (CEPACOL) lozenge 3 mg  1 lozenge Oral PRN Ditty, Kevan Ny, MD       Or  . phenol (CHLORASEPTIC) mouth spray 1 spray  1 spray Mouth/Throat PRN Ditty, Kevan Ny, MD      . multivitamin with minerals tablet 1 tablet  1 tablet Oral Daily Everrett Coombe, MD   1 tablet at 04/02/17 1056  . ondansetron (ZOFRAN) tablet 4 mg  4 mg Oral Q6H PRN Ditty, Kevan Ny, MD       Or  . ondansetron [facility]) injection 4 mg  4 mg Intravenous Q6H PRN Ditty, Kevan Ny, MD      . oxyCODONE (Oxy IR/ROXICODONE) immediate release tablet 5 mg  5 mg Oral Q8H PRN Everrett Coombe, MD   5 mg at 04/01/17 7035  . pantoprazole (PROTONIX) injection 40 mg  40 mg Intravenous QHS Ditty, Kevan Ny, MD   40 mg at 04/01/17 2154  . piperacillin-tazobactam (ZOSYN) IVPB 2.25 g  2.25 g  Intravenous Q8H Rolla Flatten, RPH 100 mL/hr at 04/02/17 1320 2.25 g at 04/02/17 1320  . polyethylene glycol (MIRALAX / GLYCOLAX) packet 17 g  17 g Oral Daily Carlyle Dolly, MD   17 g at 04/01/17 1126  . polyvinyl alcohol (LIQUIFILM TEARS) 1.4 % ophthalmic solution 1 drop  1 drop Both Eyes Daily PRN Everrett Coombe, MD      . senna [facility]) tablet 8.6 mg  1 tablet Oral BID Ditty, Kevan Ny, MD   8.6 mg at 04/01/17 2117  . sodium chloride flush (NS) 0.9 % injection 3  mL  3 mL Intravenous Q12H Ditty, Kevan Ny, MD   3 mL at 03/31/17 2241  . sodium chloride flush (NS) 0.9 % injection 3 mL  3 mL Intravenous PRN Ditty, Kevan Ny, MD      . sodium phosphate (FLEET) 7-19 GM/118ML enema 1 enema  1 enema Rectal Once PRN Ditty, Kevan Ny, MD      . vancomycin (VANCOCIN) IVPB 1000 mg/200 mL premix  1,000 mg Intravenous Q48H Rolla Flatten, [facility]         Discharge Medications: Please see discharge summary for a list of discharge medications.  Relevant Imaging Results:  Relevant Lab Results:   Additional Information ss#[id number]. Patient wears a back brace.  Sable Feil, LCSW

## 2017-04-02 NOTE — Progress Notes (Addendum)
Pt seen and examined.  No complaints this morning.  EXAM: Temp:  [98 F (36.7 C)-98.7 F (37.1 C)] 98 F (36.7 C) (08/30 0421) Pulse Rate:  [49-59] 49 (08/30 0421) Resp:  [15-20] 15 (08/30 0421) BP: (159-198)/(44-67) 172/44 (08/30 0421) SpO2:  [97 %-100 %] 97 % (08/30 0421) Weight:  [98.4 kg (216 lb 14.9 oz)] 98.4 kg (216 lb 14.9 oz) (08/29 2106) Intake/Output      08/29 0701 - 08/30 0700 08/30 0701 - 08/31 0700   P.O. 660    I.V. (mL/kg) 0 (0)    Blood 358    Other 0    IV Piggyback 150    Total Intake(mL/kg) 1168 (11.9)    Urine (mL/kg/hr) 1700 (0.7)    Emesis/NG output 0    Drains 270    Other 0    Stool 0    Blood 0    Total Output 1970     Net -802          Urine Occurrence 800 x    Stool Occurrence 4 x    Emesis Occurrence 0 x     Awake but confused Answers: Year: 50; DOB: 42; Location: 20 Does follow commands Wound vac in place.  Plan Confused. Pending ID consult. Page multiple times this am. Pending call back Continue with wound vac per [place] with Dr Cyndy Freeze

## 2017-04-02 NOTE — Consult Note (Signed)
[facility] Infect[facility] Disease    Date of Admission:  03/29/2017           Day 5 piperacillin tazobactam       Reason for Consult: Postoperative lumbar wound infection    Referring Provider: Dr. Josephine Igo  Assessment: Taylor Tyler has a Proteus postoperative lumbar wound infection that tracks deep to her recent fusion and hardware. I do not know the cause of her altered mental status. Although not terribly likely it is possible that she has meningoencephalitis complicating her lumbar wound infection. I will continue treatment for her infection with high-dose IV ceftriaxone for now. Once she is improved and we have the option of treating her with an oral regimen of levofloxacin which has excellent bioavailability and would obviate the need for long-term IV access.  Plan: 1. Narrow piperacillin tazobactam to ceftriaxone 2 g IV every 12   Principal Problem:   Postoperative wound infection Active Problems:   Status post lumbar surgery   Lethargy   Hypothyroidism   HLD (hyperlipidemia)   Spinal stenosis of lumbar region   Obesity, Class III, BMI 40-49.9 (morbid obesity) (HCC)   DM (diabetes mellitus), type 2 with renal complications (HCC)   Acute renal failure superimposed on stage 3 chronic kidney disease (HCC)   Altered mental status   Pressure injury of skin   Diarrhea   Urinary retention   . acetaminophen  1,000 mg Oral TID  . atorvastatin  40 mg Oral QPM  . Chlorhexidine Gluconate Cloth  6 each Topical Once  . cloNIDine  0.3 mg Oral BID  . collagenase   Topical Daily  . docusate sodium  100 mg Oral BID  . gabapentin  300 mg Oral TID  . heparin  5,000 Units Subcutaneous Q8H  . insulin aspart  0-9 Units Subcutaneous TID WC  . levothyroxine  88 mcg Oral QAC breakfast  . multivitamin with minerals  1 tablet Oral Daily  . pantoprazole (PROTONIX) IV  40 mg Intravenous QHS  . polyethylene glycol  17 g Oral Daily  . senna  1 tablet Oral BID  . sodium  chloride flush  3 mL Intravenous Q12H    HPI: Taylor Tyler is a 76 y.o. female who underwent redo spinal fusion from L3-S1 on 02/26/2017. She recently developed wound dehiscence and confusion and was readmitted on 03/29/2017. She has been afebrile. She was taken back to the OR on 03/30/2017. I do not have the full operative note for review but deeper tissue specimen submitted for culture have grown Proteus mirabilis. She has been on broad empiric antibiotic therapy. Her nurse tells me that she has had further neurologic decline overnight. At times she is alert enough to follow some very simple commands. At other times she is very lethargic and difficult to arouse. Apparently in the last 24 hours she has developed some diffuse jerking movements of her arms.   Review of Systems: Review of Systems  Unable to perform ROS: Mental acuity  Gastrointestinal: Positive for diarrhea.    Past Medical History:  Diagnosis Date  . Anemia   . Arthritis   . Chronic kidney disease    ?? renal insufficiency,   . Diabetes mellitus without complication ([place])    diagnosed 4-5 yrs ago  . Disease of pancreas   . DJD (degenerative joint disease)   . Hypertension   . Hypothyroidism   . Wound healing, delayed    back  Social History  Substance Use Topics  . Smoking status: Never Smoker  . Smokeless tobacco: Never Used  . Alcohol use No    Family History  Problem Relation Age of Onset  . Diabetes Father   . Heart attack Father   . Hypertension Father   . Diabetes Mother   . Hypertension Mother    Allergies  Allergen Reactions  . Clams [Shellfish Allergy] Swelling and Other (See Comments)    THROAT SWELLS NECK TURNS RED  . Hydralazine Other (See Comments)    CHEST TIGHTNESS    OBJECTIVE: Blood pressure (!) 150/49, pulse (!) 50, temperature 97.6 F (36.4 C), temperature source Oral, resp. rate 16, height 5' (1.524 m), weight 216 lb 14.9 oz (98.4 kg), SpO2 100 %.  Physical Exam    Constitutional:  She is sound asleep and snoring loudly. She does not arouse. She is obese.  Cardiovascular: Normal rate and regular rhythm.   No murmur heard. Distant heart sounds.  Pulmonary/Chest: Effort normal and breath sounds normal.  Abdominal: Soft. She exhibits no distension.  Musculoskeletal: She exhibits edema.  She has a wound VAC dressing on her lumbar wound.  Skin: No rash noted.    Lab Results Lab Results  Component Value Date   WBC 11.8 (H) 04/02/2017   HGB 10.4 (L) 04/02/2017   HCT 32.0 (L) 04/02/2017   MCV 84.4 04/02/2017   PLT 215 04/02/2017    Lab Results  Component Value Date   CREATININE 2.47 (H) 04/02/2017   BUN 64 (H) 04/02/2017   NA 140 04/02/2017   K 4.0 04/02/2017   CL 113 (H) 04/02/2017   CO2 19 (L) 04/02/2017    Lab Results  Component Value Date   ALT 18 03/30/2017   AST 29 03/30/2017   ALKPHOS 82 03/30/2017   BILITOT 0.6 03/30/2017     Microbiology: Recent Results (from the past 240 hour(s))  Culture, blood (routine x 2)     Status: None (Preliminary result)   Collection Time: 03/29/17  1:23 PM  Result Value Ref Range Status   Specimen Description BLOOD RIGHT ANTECUBITAL  Final   Special Requests IN PEDIATRIC BOTTLE Blood Culture adequate volume  Final   Culture NO GROWTH 4 DAYS  Final   Report Status PENDING  Incomplete  Culture, blood (routine x 2)     Status: None (Preliminary result)   Collection Time: 03/29/17  1:31 PM  Result Value Ref Range Status   Specimen Description BLOOD RIGHT ANTECUBITAL  Final   Special Requests   Final    BOTTLES DRAWN AEROBIC AND ANAEROBIC Blood Culture adequate volume   Culture NO GROWTH 4 DAYS  Final   Report Status PENDING  Incomplete  C difficile quick scan w PCR reflex     Status: None   Collection Time: 03/29/17  2:48 PM  Result Value Ref Range Status   C Diff antigen NEGATIVE NEGATIVE Final   C Diff toxin NEGATIVE NEGATIVE Final   C Diff interpretation No C. difficile detected.  Final   Urine culture     Status: Abnormal   Collection Time: 03/29/17 10:54 PM  Result Value Ref Range Status   Specimen Description URINE, CATHETERIZED  Final   Special Requests NONE  Final   Culture MULTIPLE SPECIES PRESENT, SUGGEST RECOLLECTION (A)  Final   Report Status 03/31/2017 FINAL  Final  Surgical PCR screen     Status: None   Collection Time: 03/30/17 11:20 AM  Result Value Ref Range Status  MRSA, PCR NEGATIVE NEGATIVE Final   Staphylococcus aureus NEGATIVE NEGATIVE Final    Comment:        The Xpert SA Assay (FDA approved for NASAL specimens in patients over 45 years of age), is one component of a comprehensive surveillance program.  Test performance has been validated by [facility] for patients greater than or equal to 36 year old. It is not intended to diagnose infection nor to guide or monitor treatment.   Aerobic/Anaerobic Culture (surgical/deep wound)     Status: None (Preliminary result)   Collection Time: 03/30/17  1:52 PM  Result Value Ref Range Status   Specimen Description WOUND LUMBAR  Final   Special Requests DEEP  Final   Gram Stain   Final    MODERATE WBC PRESENT, PREDOMINANTLY PMN NO ORGANISMS SEEN    Culture   Final    FEW PROTEUS MIRABILIS NO ANAEROBES ISOLATED; CULTURE IN PROGRESS FOR 5 DAYS    Report Status PENDING  Incomplete   Organism ID, Bacteria PROTEUS MIRABILIS  Final      Susceptibility   Proteus mirabilis - MIC*    AMPICILLIN <=2 SENSITIVE Sensitive     CEFAZOLIN <=4 SENSITIVE Sensitive     CEFEPIME <=1 SENSITIVE Sensitive     CEFTAZIDIME <=1 SENSITIVE Sensitive     CEFTRIAXONE <=1 SENSITIVE Sensitive     CIPROFLOXACIN <=0.25 SENSITIVE Sensitive     GENTAMICIN <=1 SENSITIVE Sensitive     IMIPENEM 8 INTERMEDIATE Intermediate     TRIMETH/SULFA <=20 SENSITIVE Sensitive     AMPICILLIN/SULBACTAM <=2 SENSITIVE Sensitive     PIP/TAZO <=4 SENSITIVE Sensitive     * FEW PROTEUS MIRABILIS  Culture, Urine     Status: None   Collection  Time: 04/01/17  9:10 AM  Result Value Ref Range Status   Specimen Description URINE, CATHETERIZED  Final   Special Requests NONE  Final   Culture NO GROWTH  Final   Report Status 04/02/2017 FINAL  Final    Michel Bickers, MD [facility] [phone] pager   [phone] cell 04/02/2017, 3:09 PM

## 2017-04-02 NOTE — Progress Notes (Signed)
Occupational Therapy Treatment Patient Details Name: Taylor Tyler MRN: [id number] DOB: [birth year] Today's Date: 04/02/2017    History of present illness SHAVONA GUNDERMAN is a 76 y.o. female presenting with AMS, diarrhea and vomiting, also s/p exploration of lumbar wound and application of wound VAC in OR on 8/27. PMH is significant for T2DM, CKD 3, HTN, HLD, chronic anemia, hypothyroidism, HLD, and spinal stenosis s/p neurosurgery operations in 01/2017 and 02/2017    OT comments  Pt significantly more lethargic and weaker than upon evaluation with jerking movements and knee buckling requiring +2 max assist for standing. Tolerated x 1 only and then becoming weak requiring return to side lying. RN in room and check vital signs. RN aware that pt has declined significantly since initial eval and RN contacted MD. Will continue efforts.  Follow Up Recommendations  SNF;Supervision/Assistance - 24 hour    Equipment Recommendations       Recommendations for Other Services      Precautions / Restrictions Precautions Precautions: Fall;Back Precaution Booklet Issued: No Precaution Comments: pt unable to recall back precautions, educated Required Braces or Orthoses: Spinal Brace Spinal Brace: Applied in sitting position;Other (comment) (may ambulate to room without brace) Spinal Brace Comments: brace likely still at SNF, family to bring it in Restrictions Weight Bearing Restrictions: No       Mobility Bed Mobility Overal bed mobility: Needs Assistance Bed Mobility: Rolling;Sidelying to Sit;Sit to Sidelying Rolling: Max assist;+2 for physical assistance Sidelying to sit: +2 for physical assistance;Max assist     Sit to sidelying: Max assist;+2 for physical assistance General bed mobility comments: used log roll technique, needed step by step cues for the process  Transfers Overall transfer level: Needs assistance Equipment used: Rolling walker (2 wheeled) Transfers: Sit to/from Stand Sit to  Stand: +2 physical assistance;Max assist         General transfer comment: pt requiring repeated, multimodal cues for hand placement, stood x 1    Balance Overall balance assessment: Needs assistance   Sitting balance-Leahy Scale: Poor Sitting balance - Comments: requiring min to max assist      Standing balance-Leahy Scale: Poor Standing balance comment: stood only momentarily at EOB, increased jerking movements and knees buckling                           ADL either performed or assessed with clinical judgement   ADL Overall ADL's : Needs assistance/impaired     Grooming: Wash/dry face;Wash/dry hands;Sitting;Minimal assistance   Upper Body Bathing: Maximal assistance;Sitting       Upper Body Dressing : Moderate assistance;Sitting           Toileting- Clothing Manipulation and Hygiene: Total assistance;+2 for physical assistance;Bed level         General ADL Comments: pt requiring multimodal cues to participate in bathing and dressing at EOB     Vision       Perception     Praxis      Cognition Arousal/Alertness: Lethargic Behavior During Therapy: Flat affect Overall Cognitive Status: Impaired/Different from baseline Area of Impairment: Orientation;Attention;Memory;Following commands;Safety/judgement;Problem solving                 Orientation Level: Disoriented to;Place;Time;Situation Current Attention Level: Sustained Memory: Decreased recall of precautions;Decreased short-term memory Following Commands: Follows one step commands with increased time Safety/Judgement: Decreased awareness of safety;Decreased awareness of deficits   Problem Solving: Slow processing;Decreased initiation;Difficulty sequencing;Requires verbal cues;Requires tactile cues  Exercises     Shoulder Instructions       General Comments      Pertinent Vitals/ Pain       Pain Assessment: Faces Faces Pain Scale: Hurts little more Pain  Location: L hip Pain Descriptors / Indicators: Discomfort Pain Intervention(s): Monitored during session;Repositioned  Home Living                                          Prior Functioning/Environment              Frequency  Min 2X/week        Progress Toward Goals  OT Goals(current goals can now be found in the care plan section)  Progress towards OT goals: Not progressing toward goals - comment (pt with increased weakness and new jerking movements)  Acute Rehab OT Goals Patient Stated Goal: did not state OT Goal Formulation: With patient Time For Goal Achievement: 04/14/17 Potential to Achieve Goals: Good  Plan Discharge plan remains appropriate    Co-evaluation                 AM-PAC PT "6 Clicks" Daily Activity     Outcome Measure   Help from another person eating meals?: A Lot Help from another person taking care of personal grooming?: A Lot Help from another person toileting, which includes using toliet, bedpan, or urinal?: Total Help from another person bathing (including washing, rinsing, drying)?: A Lot Help from another person to put on and taking off regular upper body clothing?: A Lot Help from another person to put on and taking off regular lower body clothing?: Total 6 Click Score: 10    End of Session Equipment Utilized During Treatment: Gait belt;Rolling walker  OT Visit Diagnosis: Other abnormalities of gait and mobility (R26.89);Unsteadiness on feet (R26.81);Pain;Other symptoms and signs involving cognitive function;Muscle weakness (generalized) (M62.81)   Activity Tolerance Patient limited by fatigue;Patient limited by lethargy   Patient Left in bed;with call bell/phone within reach;with nursing/sitter in room   Nurse Communication Mobility status;Other (comment) (significant change in status)        Time: 1594-5859 OT Time Calculation (min): 40 min  Charges: OT General Charges $OT Visit: 1 Visit OT  Treatments $Self Care/Home Management : 23-37 mins  04/02/2017 Nestor Lewandowsky, OTR/L Pager: [phone]   Werner Lean, Haze Boyden 04/02/2017, 2:04 PM

## 2017-04-03 LAB — BASIC METABOLIC PANEL
ANION GAP: 5 (ref 5–15)
BUN: 62 mg/dL — AB (ref 6–20)
CHLORIDE: 113 mmol/L — AB (ref 101–111)
CO2: 23 mmol/L (ref 22–32)
Calcium: 8.6 mg/dL — ABNORMAL LOW (ref 8.9–10.3)
Creatinine, Ser: 2.29 mg/dL — ABNORMAL HIGH (ref 0.44–1.00)
GFR calc Af Amer: 23 mL/min — ABNORMAL LOW (ref >60)
GFR calc non Af Amer: 20 mL/min — ABNORMAL LOW (ref >60)
GLUCOSE: 105 mg/dL — AB (ref 65–99)
POTASSIUM: 3.7 mmol/L (ref 3.5–5.1)
Sodium: 141 mmol/L (ref 135–145)

## 2017-04-03 LAB — CBC
HCT: 29.7 % — ABNORMAL LOW (ref 36.0–46.0)
Hemoglobin: 9.5 g/dL — ABNORMAL LOW (ref 12.0–15.0)
MCH: 27.1 pg (ref 26.0–34.0)
MCHC: 32 g/dL (ref 30.0–36.0)
MCV: 84.6 fL (ref 78.0–100.0)
Platelets: 228 10*3/uL (ref 150–400)
RBC: 3.51 MIL/uL — ABNORMAL LOW (ref 3.87–5.11)
RDW: 15.5 % (ref 11.5–15.5)
WBC: 10.7 10*3/uL — ABNORMAL HIGH (ref 4.0–10.5)

## 2017-04-03 LAB — GLUCOSE, CAPILLARY
GLUCOSE-CAPILLARY: 117 mg/dL — AB (ref 65–99)
GLUCOSE-CAPILLARY: 106 mg/dL — AB (ref 65–99)
Glucose-Capillary: 85 mg/dL (ref 65–99)
Glucose-Capillary: 100 mg/dL — ABNORMAL HIGH (ref 65–99)
Glucose-Capillary: 138 mg/dL — ABNORMAL HIGH (ref 65–99)

## 2017-04-03 LAB — CULTURE, BLOOD (ROUTINE X 2)
CULTURE: NO GROWTH
CULTURE: NO GROWTH
Special Requests: ADEQUATE
Special Requests: ADEQUATE

## 2017-04-03 MED ORDER — HYDROCHLOROTHIAZIDE 25 MG PO TABS
25.0000 mg | ORAL_TABLET | Freq: Every day | ORAL | Status: DC
  Administered 2017-04-03 – 2017-04-07 (×5): 25 mg via ORAL
  Filled 2017-04-03 (×6): qty 1

## 2017-04-03 MED ORDER — GABAPENTIN 300 MG PO CAPS: 300.0000 mg | ORAL_CAPSULE | Freq: Two times a day (BID) | ORAL | Status: DC

## 2017-04-03 MED ORDER — INSULIN ASPART 100 UNIT/ML ~~LOC~~ SOLN: 0.0000 [IU] | Freq: Three times a day (TID) | SUBCUTANEOUS | Status: DC

## 2017-04-03 MED ORDER — LEVOFLOXACIN 500 MG PO TABS
250.0000 mg | ORAL_TABLET | Freq: Every day | ORAL | Status: DC
  Administered 2017-04-04 – 2017-04-07 (×4): 250 mg via ORAL
  Filled 2017-04-03 (×4): qty 1

## 2017-04-03 MED ORDER — GABAPENTIN 100 MG PO CAPS
100.0000 mg | ORAL_CAPSULE | Freq: Two times a day (BID) | ORAL | Status: DC
  Administered 2017-04-03 – 2017-04-07 (×9): 100 mg via ORAL
  Filled 2017-04-03 (×9): qty 1

## 2017-04-03 MED ORDER — LEVOFLOXACIN 500 MG PO TABS
500.0000 mg | ORAL_TABLET | Freq: Every day | ORAL | Status: AC
  Administered 2017-04-03: 500 mg via ORAL
  Filled 2017-04-03: qty 1

## 2017-04-03 MED ORDER — PANTOPRAZOLE SODIUM 40 MG PO TBEC
40.0000 mg | DELAYED_RELEASE_TABLET | Freq: Every day | ORAL | Status: DC
  Administered 2017-04-03 – 2017-04-06 (×4): 40 mg via ORAL
  Filled 2017-04-03 (×3): qty 1

## 2017-04-03 NOTE — Progress Notes (Signed)
Pt seen and examined. Remains confused. ?improved Not complaining of anything Denies pain  EXAM: Temp:  [97.5 F (36.4 C)-97.8 F (36.6 C)] 97.6 F (36.4 C) (08/31 0820) Pulse Rate:  [50-54] 52 (08/31 0820) BP: (150-191)/(49-63) 191/63 (08/31 0820) SpO2:  [97 %-100 %] 100 % (08/31 0820) Weight:  [107.2 kg (236 lb 6.4 oz)] 107.2 kg (236 lb 6.4 oz) (08/30 2134) Intake/Output      08/30 0701 - 08/31 0700 08/31 0701 - 09/01 0700   P.O. 600 360   Other 240    IV Piggyback 100    Total Intake(mL/kg) 940 (8.8) 360 (3.4)   Urine (mL/kg/hr) 950 (0.4) 400 (0.7)   Emesis/NG output 0    Drains 60    Other 0    Stool 1 0   Blood 0    Total Output 1011 400   Net -71 -40        Urine Occurrence 0 x 0 x   Stool Occurrence 1 x 1 x   Emesis Occurrence 0 x     Awake but confused Answers: Year: 2000; DOB: 1998; Location: "I dont know" Does follow commands  Plan No new NS issues ID on board for abx Continue with wound vac per [place] Will follow from [facility].  F/U 3 weeks outpt Call for any concerns

## 2017-04-03 NOTE — Clinical Social Work Note (Signed)
Clinical Social Work Assessment  Patient Details  Name: Taylor Tyler MRM: [id number] Date of Birth: 09/17/1940  Date of referral:  04/02/17               Reason for consult:  Facility Placement (Patient from [facility] and [facility])                Permission sought to share information with:  Family Supports Permission granted to share information::  No (Patient oriented to person only)  Name::     [name] [place] and [place]::     Relationship::  Sons  Contact Information:  [name] [phone] and Ollen Gross - [phone]  Housing/Transportation Living arrangements for the past 2 months:  [place], [place] (Patient admitted to [facility] on 03/03/17) Source of Information:  Adult Children, Other (Comment Required), Facility [facility], SNF and chart) Patient Interpreter Needed:  None Criminal Activity/Legal Involvement Pertinent to Current Situation/Hospitalization:  No - Comment as needed Significant Relationships:  Adult Children Lives with:  Facility Resident Do you feel safe going back to the place where you live?  Yes Need for family participation in patient care:  Yes (Comment)  Care giving concerns:  Mr. Luretta Everly concerned about the SNF's ability to manage the wound vac. Mr. Gosdin indicated that he would be talking with staff at the facility regarding this concern.  Social Worker assessment / plan:  CSW talked with patient's son [name] by phone regarding discharge plan. Mr. Stanko reported that he is not paying to hold the bed at [facility], but would like his mother to return there if a bed is available at discharge. Mr. Kashani expressed concern regarding the facility being able to manage the wound vac patient has and will need at facility. CSW encouraged son to talk with someone at facility and assured him that this would also be mentioned to admissions director.   Employment status:  Retired Forensic scientist:  Research scientist (life sciences)) PT Recommendations:  [place] / Referral to community resources:  [place] (None needed or requested as patient from a skilled nursing facility)  Patient/Family's Response to care:  No concerns expressed regarding care during hospitalization. Son is concerned about patient's wound care once she returns to the skilled facility.  Patient/Family's Understanding of and Emotional Response to Diagnosis, Current Treatment, and Prognosis:  Son appears knowledgeable about patient's illness and treatment and wants to assure patient will receive the appropriate wound care at discharge.  Emotional Assessment Appearance:  Appears stated age (CSW looked into room but did not engage with patient as she appeared to be asleep) Attitude/Demeanor/Rapport:  Unable to Assess Affect (typically observed):  Unable to Assess Orientation:  Oriented to Self Alcohol / Substance use:  Tobacco Use, Alcohol Use, Illicit Drugs (Patient reported that she has never smoke and does not drink or use illicit drugs) Psych involvement (Current and /or in the community):  No (Comment)  Discharge Needs  Concerns to be addressed:  Discharge Planning Concerns Readmission within the last 30 days:  No Current discharge risk:  None Barriers to Discharge:  Continued Medical Work up   [employer] Mila Homer, [place] 04/03/2017, 3:24 PM

## 2017-04-03 NOTE — Progress Notes (Signed)
[name], female   DOB: 11-Jun-1941, 76 y.o.   MRN: [id number]          [facility] for Infectious Disease  Date of Admission:  03/29/2017   Total days of antibiotics 6        Day 2 ceftriaxone         ASSESSMENT: She has a Proteus lumbar wound infection. Her mental status is markedly improved today and she has no clinical findings to suggest meningitis. Her wound infection can be treated with a highly bioavailable oral agent such as levofloxacin. I will plan on at least 6 weeks of postoperative therapy.  PLAN: 1. Change ceftriaxone to renally dosed levofloxacin and treat for 6 weeks through 05/10/2017 2. I will arrange follow-up in my clinic in early October 3. I will sign off now  Principal Problem:   Postoperative wound infection Active Problems:   Status post lumbar surgery   Lethargy   Hypothyroidism   HLD (hyperlipidemia)   Spinal stenosis of lumbar region   Obesity, Class III, BMI 40-49.9 (morbid obesity) (HCC)   DM (diabetes mellitus), type 2 with renal complications (HCC)   Acute renal failure superimposed on stage 3 chronic kidney disease (HCC)   Altered mental status   Pressure injury of skin   Diarrhea   Urinary retention   . acetaminophen  1,000 mg Oral TID  . atorvastatin  40 mg Oral QPM  . Chlorhexidine Gluconate Cloth  6 each Topical Once  . cloNIDine  0.3 mg Oral BID  . collagenase   Topical Daily  . docusate sodium  100 mg Oral BID  . gabapentin  100 mg Oral BID  . heparin  5,000 Units Subcutaneous Q8H  . hydrochlorothiazide  25 mg Oral Daily  . insulin aspart  0-3 Units Subcutaneous TID WC  . levothyroxine  88 mcg Oral QAC breakfast  . multivitamin with minerals  1 tablet Oral Daily  . pantoprazole  40 mg Oral QHS  . polyethylene glycol  17 g Oral Daily  . senna  1 tablet Oral BID  . sodium chloride flush  3 mL Intravenous Q12H    SUBJECTIVE: She is feeling much better today. She denies headache. She has no recall of recent  events.  Review of Systems: Review of Systems  Constitutional: Negative for chills, diaphoresis and fever.  Gastrointestinal: Negative for abdominal pain, diarrhea, nausea and vomiting.  Musculoskeletal: Negative for back pain.  Neurological: Negative for headaches.    Allergies  Allergen Reactions  . Clams [Shellfish Allergy] Swelling and Other (See Comments)    THROAT SWELLS NECK TURNS RED  . Hydralazine Other (See Comments)    CHEST TIGHTNESS    OBJECTIVE: Vitals:   04/02/17 1706 04/02/17 2134 04/03/17 0458 04/03/17 0820  BP: (!) 170/60 (!) 183/52 (!) 160/60 (!) 191/63  Pulse: (!) 54 (!) 51 (!) 54 (!) 52  Resp:      Temp: 97.8 F (36.6 C) (!) 97.5 F (36.4 C) 97.6 F (36.4 C) 97.6 F (36.4 C)  TempSrc: Oral Oral Oral Oral  SpO2: 100% 97% 100% 100%  Weight:  236 lb 6.4 oz (107.2 kg)    Height:       Body mass index is 46.17 kg/m.  Physical Exam  Constitutional: She is oriented to person, place, and time.  She is resting quietly and comfortably in bed. She is much more alert and conversant today.  Neck: Neck supple.  Cardiovascular: Normal rate and regular rhythm.  No murmur heard. Very distant heart sounds.  Pulmonary/Chest: Effort normal and breath sounds normal.  Abdominal: Soft. There is no tenderness.  Musculoskeletal:  VAC wound dressing on lumbar wound.  Neurological: She is alert and oriented to person, place, and time.  Psychiatric: Mood and affect normal.    Lab Results Lab Results  Component Value Date   WBC 10.7 (H) 04/03/2017   HGB 9.5 (L) 04/03/2017   HCT 29.7 (L) 04/03/2017   MCV 84.6 04/03/2017   PLT 228 04/03/2017    Lab Results  Component Value Date   CREATININE 2.29 (H) 04/03/2017   BUN 62 (H) 04/03/2017   NA 141 04/03/2017   K 3.7 04/03/2017   CL 113 (H) 04/03/2017   CO2 23 04/03/2017    Lab Results  Component Value Date   ALT 18 03/30/2017   AST 29 03/30/2017   ALKPHOS 82 03/30/2017   BILITOT 0.6 03/30/2017      Microbiology: Recent Results (from the past 240 hour(s))  Culture, blood (routine x 2)     Status: None (Preliminary result)   Collection Time: 03/29/17  1:23 PM  Result Value Ref Range Status   Specimen Description BLOOD RIGHT ANTECUBITAL  Final   Special Requests IN PEDIATRIC BOTTLE Blood Culture adequate volume  Final   Culture NO GROWTH 4 DAYS  Final   Report Status PENDING  Incomplete  Culture, blood (routine x 2)     Status: None (Preliminary result)   Collection Time: 03/29/17  1:31 PM  Result Value Ref Range Status   Specimen Description BLOOD RIGHT ANTECUBITAL  Final   Special Requests   Final    BOTTLES DRAWN AEROBIC AND ANAEROBIC Blood Culture adequate volume   Culture NO GROWTH 4 DAYS  Final   Report Status PENDING  Incomplete  C difficile quick scan w PCR reflex     Status: None   Collection Time: 03/29/17  2:48 PM  Result Value Ref Range Status   C Diff antigen NEGATIVE NEGATIVE Final   C Diff toxin NEGATIVE NEGATIVE Final   C Diff interpretation No C. difficile detected.  Final  Urine culture     Status: Abnormal   Collection Time: 03/29/17 10:54 PM  Result Value Ref Range Status   Specimen Description URINE, CATHETERIZED  Final   Special Requests NONE  Final   Culture MULTIPLE SPECIES PRESENT, SUGGEST RECOLLECTION (A)  Final   Report Status 03/31/2017 FINAL  Final  Surgical PCR screen     Status: None   Collection Time: 03/30/17 11:20 AM  Result Value Ref Range Status   MRSA, PCR NEGATIVE NEGATIVE Final   Staphylococcus aureus NEGATIVE NEGATIVE Final    Comment:        The Xpert SA Assay (FDA approved for NASAL specimens in patients over 70 years of age), is one component of a comprehensive surveillance program.  Test performance has been validated by [facility] for patients greater than or equal to 26 year old. It is not intended to diagnose infection nor to guide or monitor treatment.   Aerobic/Anaerobic Culture (surgical/deep wound)     Status:  None (Preliminary result)   Collection Time: 03/30/17  1:52 PM  Result Value Ref Range Status   Specimen Description WOUND LUMBAR  Final   Special Requests DEEP  Final   Gram Stain   Final    MODERATE WBC PRESENT, PREDOMINANTLY PMN NO ORGANISMS SEEN    Culture   Final    FEW PROTEUS MIRABILIS  NO ANAEROBES ISOLATED; CULTURE IN PROGRESS FOR 5 DAYS    Report Status PENDING  Incomplete   Organism ID, Bacteria PROTEUS MIRABILIS  Final      Susceptibility   Proteus mirabilis - MIC*    AMPICILLIN <=2 SENSITIVE Sensitive     CEFAZOLIN <=4 SENSITIVE Sensitive     CEFEPIME <=1 SENSITIVE Sensitive     CEFTAZIDIME <=1 SENSITIVE Sensitive     CEFTRIAXONE <=1 SENSITIVE Sensitive     CIPROFLOXACIN <=0.25 SENSITIVE Sensitive     GENTAMICIN <=1 SENSITIVE Sensitive     IMIPENEM 8 INTERMEDIATE Intermediate     TRIMETH/SULFA <=20 SENSITIVE Sensitive     AMPICILLIN/SULBACTAM <=2 SENSITIVE Sensitive     PIP/TAZO <=4 SENSITIVE Sensitive     * FEW PROTEUS MIRABILIS  Culture, Urine     Status: None   Collection Time: 04/01/17  9:10 AM  Result Value Ref Range Status   Specimen Description URINE, CATHETERIZED  Final   Special Requests NONE  Final   Culture NO GROWTH  Final   Report Status 04/02/2017 FINAL  Final    Michel Bickers, MD [place] [facility] [place] [facility] [phone] pager   [phone] cell 04/03/2017, 10:49 AM

## 2017-04-03 NOTE — Progress Notes (Signed)
Bladder scan >260

## 2017-04-03 NOTE — Progress Notes (Signed)
Physical Therapy Treatment Patient Details Name: Taylor Tyler MRN: [id number] DOB: 08/17/1940 Today's Date: 04/03/2017    History of Present Illness KRYSTINE PABST is a 76 y.o. female presenting with AMS, diarrhea and vomiting, also s/p exploration of lumbar wound and application of wound VAC in OR on 8/27. PMH is significant for T2DM, CKD 3, HTN, HLD, chronic anemia, hypothyroidism, HLD, and spinal stenosis s/p neurosurgery operations in 01/2017 and 02/2017     PT Comments    Patient lethargic today during PT session.  Multiple cues to remain alert and on task.  Patient requiring increased assist for bed mobility, and declined further mobility.  Agreed to perform LE exercises.   Follow Up Recommendations  SNF     Equipment Recommendations  Rolling walker with 5" wheels;3in1 (PT)    Recommendations for Other Services       Precautions / Restrictions Precautions Precautions: Fall;Back (Wound VAC) Precaution Booklet Issued: No Precaution Comments: pt unable to recall back precautions, educated Required Braces or Orthoses: Spinal Brace Spinal Brace: Applied in sitting position (May ambulate to bathroom without brace) Spinal Brace Comments: brace likely still at SNF, family to bring it in Restrictions Weight Bearing Restrictions: No    Mobility  Bed Mobility Overal bed mobility: Needs Assistance Bed Mobility: Rolling Rolling: Max assist;+2 for physical assistance         General bed mobility comments: Patient lethargic.  Total assist to attempt to roll.  Transfers                 General transfer comment: Unable today  Ambulation/Gait                 Stairs            Wheelchair Mobility    Modified Rankin (Stroke Patients Only)       Balance                                            Cognition Arousal/Alertness: Lethargic Behavior During Therapy: Flat affect Overall Cognitive Status: Impaired/Different from  baseline Area of Impairment: Orientation;Attention;Memory;Following commands;Safety/judgement;Problem solving                 Orientation Level: Disoriented to;Place;Time;Situation Current Attention Level: Sustained Memory: Decreased recall of precautions;Decreased short-term memory Following Commands: Follows one step commands with increased time;Follows one step commands inconsistently Safety/Judgement: Decreased awareness of safety;Decreased awareness of deficits   Problem Solving: Slow processing;Decreased initiation;Difficulty sequencing;Requires verbal cues;Requires tactile cues        Exercises General Exercises - Lower Extremity Ankle Circles/Pumps: AROM;Both;5 reps;Supine Quad Sets: AROM;Both;5 reps;Supine Heel Slides: AROM;Both;5 reps;Supine Hip ABduction/ADduction: AROM;Both;5 reps;Supine    General Comments        Pertinent Vitals/Pain Pain Assessment: Faces Faces Pain Scale: Hurts little more Pain Location: L hip Pain Descriptors / Indicators: Discomfort Pain Intervention(s): Limited activity within patient's tolerance    Home Living                      Prior Function            PT Goals (current goals can now be found in the care plan section) Acute Rehab PT Goals Patient Stated Goal: did not state Progress towards PT goals: Not progressing toward goals - comment (lethargic)    Frequency    Min 3X/week  PT Plan Current plan remains appropriate    Co-evaluation              AM-PAC PT "6 Clicks" Daily Activity  Outcome Measure  Difficulty turning over in bed (including adjusting bedclothes, sheets and blankets)?: Unable Difficulty moving from lying on back to sitting on the side of the bed? : Unable Difficulty sitting down on and standing up from a chair with arms (e.g., wheelchair, bedside commode, etc,.)?: Unable Help needed moving to and from a bed to chair (including a wheelchair)?: Total Help needed walking in  hospital room?: Total Help needed climbing 3-5 steps with a railing? : Total 6 Click Score: 6    End of Session   Activity Tolerance: Patient limited by lethargy;Patient limited by fatigue Patient left: in bed;with call bell/phone within reach;with bed alarm set   PT Visit Diagnosis: Other abnormalities of gait and mobility (R26.89);Pain Pain - Right/Left: Left Pain - part of body: Hip     Time: 9622-2979 PT Time Calculation (min) (ACUTE ONLY): 10 min  Charges:  $Therapeutic Exercise: 8-22 mins                    G Codes:       Carita Pian. Sanjuana Kava, [facility] Acute Rehab Services Pager [phone]    Despina Pole 04/03/2017, 5:06 PM

## 2017-04-03 NOTE — Progress Notes (Signed)
Family Medicine Teaching Service Daily Progress Note Intern Pager: [phone]  Patient name: [place] record number: [id number] Date of birth: 07/22/1941 Age: 76 y.o. Gender: female  Primary Care Provider: Lenard Simmer, MD Consultants: Neurosurgery Code Status:Full, unable to ask patient as she is altered  Pt Overview and Major Events to Date:  CARRON MCMURRY a 76 y.o.femalepresenting with AMS, diarrhea and vomiting, also planning for spinal procedure in OR with neurosurgery 8/27. PMH is significant for T2DM, CKD 3, HTN, HLD, chronic anemia, hypothyroidism, HLD, and spinal stenosis s/p neurosurgery operations in 01/2017 and 02/2017   Assessment and Plan: AMS-Waxing and waning neuro status. Yesterday afternoon, AxO2, but increased weakness. This morning, only oriented to self w/ no perceived neuro deficits. Uncertain etiology. Possible intracranial infections from open back wound Proteus mirabilis , but remains unlikely given no growth on blood cultures, afebrile/no leukocytosis. Iiatrogenic cause possible, but less likely given reduced sedating meds since admission. Possible uremia given elevated Cr ~2.5. CVA not likely given Neg CT/MRI head negative for acute process. Severe dehydration with diarrhea was thought to be contributing, but pt has not inproved with IV and parentaral nutrition. TSH nl. Back wound culture few Proteus mirabilis, pan sensitive - monitor on telemetry - d/d IV vanc/zosyn (08/29 - 08/30), narrowed to Ceftriaxone (08/31). ID did not recommend getting CSF cultures - neurosurgery recs - ID consult appreciated - limit sedating meds - q4H neuro checks - CBG monitoring TID/AC - Blood cultures, ngtd @ 4 days - Urine culture no growth  Open back wound s/p neurosurgery - s/p L3-4 and L4-5 laminectomies with L4-5 Gill procedure, L5-S1 laminectomy with facetectomy, posterior segmental instrumented fusion L3-S1 on 02/26/2017. Wound culture Proteus mirabilis,  pan sensitive  - Wound care appreciated - will follow up neurosurg recs - d/d IV vanc/zosyn (08/29 - 08/30), narrowed to Ceftriaxone (08/31). ID did not recommend getting CSF cultures  Diarrhea/vomiting- improved 1 soft stool past 24 hrs. Abdominal pain has resolved. C diff negative in ED.  CT abdomen without acute abnormality. - Zofran PRN nausea - consider GI panel if AMS/abdominal pain worsens, or pt develops worsening abdominal pain/diarrhea/emesis  AKI on CKD III - improved BUN/Cr 62/2.29, from baseline 1.4-1.8. UOP improved w/ foley, 1749mL past 24 hrs.  - monitor AM creatinine - holding nephrotoxic agents (home Lasix, HCTZ) - void trial today - follow uop, if low, get bladder scan, low threshold to replace foley  HTN - BP 150-191/49-63. HR 49-59. Home regimen includes coreg 25 mg BID, Clonidine 0.2 mg BID, lasix 20 mg daily, losartan 100 mg daily, hctz 25 mg daily, patient is allergic to hydralazine of "chest tightness". Of note, pt received one dose of 10mg  IV in ED w/o obvious signs of allergy or side effect.  Holding nephrotoxic agents due to aki, and allergy to hydralazine -need to discuss next [facility] treatment - taking 0.3 mg clonididine, will not increase clonidine, due to bradycardia - add back home hctz given improved kidney fxn - hold betablockers due to bradycardia - hold hydralazine due to allergy, will ask family if they know more about allergy  LE edema, chronic - stable. uncertain etiology. 2+ on exam today. Nl lung exam. satting well on RA. Patient had echo 01/16/2017 which showed EF 55-60%, moderate LVH, no regional wall motion abnormalities. Albumin low at 2.4 which may contribute. Takes PO Lasix 20 mg daily at home. BNP elevated at 492.  - holding lasix for AKI and NPO, consider adding back lasix if aki  improves - cont to monitor  T2DM- CBGs 65-320. Most recent sugars 85. Lowered intensity of sliding scale.  last A1c 5.7 on 02/16/2017. Home regimen is metformin 500  mg BID.  - hold home metformin with AKI and AMS - SSI  Anemia, acute on chronic- Improved 10.7 on 08/31.  9-10 baseline, though has been low at 7.8-8.0 for the last month after spinal surgery. CKD likely contributes to chronic. Home regimen includes Ferrous sulfate 325 mg daily. S/p 2 units rbc on 04/01/17. - hold home iron in setting of possible acute infection - discuss restarting home iron and transfusion threshold - cont to monitor   Hypothyroidism - TSH 0.984. On Synthyroid 100 mcg daily by 8/1 note.  - cont. Home dose  HLD- last lipid panel apparently not available in epic. Home regimen Atorva 40 mg QHS.  - continue statin  FEN/GI: Carb Modified Prophylaxis: heparin given kidney injury  Disposition:Telemetry.   Subjective:  Asleep in bed, but rousable. Able to follow commands. Does not endorse any complaints.   Objective: Temp:  [97.5 F (36.4 C)-98.4 F (36.9 C)] 97.6 F (36.4 C) (08/31 0458) Pulse Rate:  [50-54] 54 (08/31 0458) Resp:  [16] 16 (08/30 0924) BP: (150-183)/(49-62) 160/60 (08/31 0458) SpO2:  [97 %-100 %] 100 % (08/31 0458) Weight:  [236 lb 6.4 oz (107.2 kg)] 236 lb 6.4 oz (107.2 kg) (08/30 2134) Physical Exam: General: alseep, but rousable, cooperative Cardiovascular: rrr, systolic murmur Respiratory: ctab, no increased work of breathing Abdomen: soft, nontender, nondistended Extremities: 5/5 upper extremity strength, 2+ lower extremity edema, 2+ dp pulses Neuro: PERRL, CN II-XII appear grossly intact, pt able to follow most commands, but pt would not turn head against hand or shrug shoulders, AxO to self only, no intentional tremor noted on exam  Laboratory:  Recent Labs Lab 04/01/17 0333 04/02/17 0405 04/03/17 0318  WBC 10.2 11.8* 10.7*  HGB 7.8* 10.4* 9.5*  HCT 23.7* 32.0* 29.7*  PLT 214 215 228    Recent Labs Lab 03/29/17 1323 03/30/17 1818 04/01/17 0333 04/02/17 0405 04/03/17 0318  NA 137 144 139 140 141  K 4.3 3.8 3.8 4.0  3.7  CL 105 115* 111 113* 113*  CO2 21* 22 22 19* 23  BUN 87* 63* 70* 64* 62*  CREATININE 2.87* 2.31* 2.67* 2.47* 2.29*  CALCIUM 8.7* 8.3* 8.2* 8.2* 8.6*  PROT 6.5 5.8*  --   --   --   BILITOT 0.6 0.6  --   --   --   ALKPHOS 90 82  --   --   --   ALT 18 18  --   --   --   AST 28 29  --   --   --   GLUCOSE 118* 155* 97 81 105*      Imaging/Diagnostic Tests: Ct Abdomen Pelvis Wo Contrast  Result Date: 03/29/2017 CLINICAL DATA:  Nausea, vomiting and weakness for 1 week. Altered mental status. Back surgery 1 month ago. EXAM: CT ABDOMEN AND PELVIS WITHOUT CONTRAST TECHNIQUE: Multidetector CT imaging of the abdomen and pelvis was performed following the standard protocol without IV contrast. COMPARISON:  Abdominopelvic CT 07/07/2016. Intraoperative lumbar radiographs 02/26/2017. FINDINGS: Lower chest: There is stable subpleural lipoma at the right lung base on image 8. The lung bases are otherwise clear. No significant pleural or pericardial effusion. The heart is mildly enlarged. Hepatobiliary: Progressive diffuse hepatic steatosis. No focal abnormalities are seen on noncontrast imaging. No significant biliary dilatation status post cholecystectomy. Pancreas: Unremarkable. No  pancreatic ductal dilatation or surrounding inflammatory changes. Spleen: Normal in size without focal abnormality. Adrenals/Urinary Tract: Both adrenal glands appear normal. Stable small nonobstructing calculus in the lower pole of the left kidney. No other urinary tract calculi are demonstrated. There is stable mild renal cortical thinning bilaterally. Chronic perinephric soft tissue stranding has improved. There is no hydronephrosis. The bladder appears unremarkable. Stomach/Bowel: No evidence of bowel wall thickening, distention or surrounding inflammatory change. The distal colonic anastomosis appears patent status post partial colectomy. There are diverticular changes throughout the colon. Broad-based ventral hernia  containing transverse colon and multiple loops of small bowel is unchanged. No evidence of incarceration or obstruction. Vascular/Lymphatic: There are no enlarged abdominal or pelvic lymph nodes. Aortic and branch vessel atherosclerosis noted. Reproductive: Hysterectomy. No evidence of adnexal mass. Probable pelvic floor laxity. Other: As above, large broad-based ventral hernia, grossly stable. No ascites or free air. Musculoskeletal: No acute or significant osseous findings. Interval revision of lower lumbar fusion. The patient is now fused from L3 through S1. The hardware appears intact. There is an open midline wound superiorly with apparent packing material and surrounding soft tissue emphysema. There is no large paraspinal fluid collection. No bone destruction identified. IMPRESSION: 1. No acute abdominopelvic findings identified. 2. Stable large ventral hernia without evidence of incarceration or obstruction. 3. Interval revision of lumbar fusion. There is an open wound containing apparent packing material. 4. Diffuse hepatic steatosis. 5.  Aortic Atherosclerosis (ICD10-I70.0). Electronically Signed   By: Richardean Sale M.D.   On: 03/29/2017 16:13   Ct Head Wo Contrast  Result Date: 03/29/2017 CLINICAL DATA:  Nausea, vomiting, and weakness for 1 week. EXAM: CT HEAD WITHOUT CONTRAST TECHNIQUE: Contiguous axial images were obtained from the base of the skull through the vertex without intravenous contrast. COMPARISON:  April 21, 2014 FINDINGS: Brain: No subdural, epidural, or subarachnoid hemorrhage identified on today's study. Cerebellum, brainstem, and basal cisterns are within normal limits. No mass effect or midline shift. Ventricles and sulci are unremarkable. No acute cortical ischemia or infarct. Vascular: Calcified atherosclerosis is seen in the intracranial carotid arteries. Skull: Normal. Negative for fracture or focal lesion. Sinuses/Orbits: There is a small amount of fluid or mucus in the  sphenoid sinuses. This is similar compared to the previous study. Paranasal sinuses, mastoid air cells, and middle ears are otherwise normal. Other: There is a stable subcutaneous nodule in the posterior scalp on series 3, image 9 of no acute significance. Extracranial soft tissues are otherwise normal. IMPRESSION: 1. No acute intracranial abnormality identified. 2. Mild sinus disease as above. Electronically Signed   By: Dorise Bullion III M.D   On: 03/29/2017 16:05   Mr Brain Wo Contrast  Result Date: 03/30/2017 CLINICAL DATA:  Altered mental status EXAM: MRI HEAD WITHOUT CONTRAST TECHNIQUE: Multiplanar, multiecho pulse sequences of the brain and surrounding structures were obtained without intravenous contrast. COMPARISON:  Head CT 03/29/2017 FINDINGS: Brain: The midline structures are normal. There is no focal diffusion restriction to indicate acute infarct. There is mild multifocal leukoaraiosis, most commonly seen in the setting of chronic hypertensive microangiopathy. No intraparenchymal hematoma or chronic microhemorrhage. Brain volume is normal for age without lobar predominant atrophy. The dura is normal and there is no extra-axial collection. Vascular: Major intracranial arterial and venous sinus flow voids are preserved. Skull and upper cervical spine: The visualized skull base, calvarium, upper cervical spine and extracranial soft tissues are normal. Sinuses/Orbits: No fluid levels or advanced mucosal thickening. No mastoid or middle ear effusion. Normal  orbits. IMPRESSION: Mild sequelae of chronic hypertensive microangiopathy. No acute abnormality. Electronically Signed   By: Ulyses Jarred M.D.   On: 03/30/2017 06:14   Dg Chest Port 1 View  Result Date: 03/29/2017 CLINICAL DATA:  76 year old female with altered mental status. Nausea vomiting and weakness for 1 week. EXAM: PORTABLE CHEST 1 VIEW COMPARISON:  Portable chest 02/26/2017 and earlier. FINDINGS: Portable AP semi upright view at 2129  hours. Stable lung volumes. Mild cardiomegaly. Other mediastinal contours are within normal limits. Allowing for portable technique the lungs are clear. No pneumothorax or pneumoperitoneum. Visible epigastric bowel gas pattern within normal limits. IMPRESSION: No acute cardiopulmonary abnormality. Electronically Signed   By: Genevie Ann M.D.   On: 03/29/2017 21:56     Bonnita Hollow, MD 04/03/2017, 6:03 AM PGY-1, [place] Intern pager: [phone], text pages welcome

## 2017-04-03 NOTE — Progress Notes (Signed)
Hypoglycemic Event  CBG: Results for Taylor Tyler, Taylor Tyler (MRN [id number]) as of 04/03/2017 02:12  Ref. Range 04/02/2017 21:39  Glucose-Capillary Latest Ref Range: 65 - 99 mg/dL 65    Treatment: 15 GM carbohydrate snack  Symptoms: None  Follow-up CBG: Time: CBG Result:  Results for Taylor Tyler, Taylor Tyler (MRN [id number]) as of 04/03/2017 02:14  Ref. Range 04/02/2017 22:45  Glucose-Capillary Latest Ref Range: 65 - 99 mg/dL 84   Possible Reasons for Event: Inadequate meal intake  Comments/MD notified:    [name]

## 2017-04-03 NOTE — Clinical Social Work Note (Signed)
CSW talked with [name], admissions Mudlogger at [facility] regarding patient and wound vac, which [name] was aware of. Per [name], they will not be able to get a wound vac if patient ready for discharge over the weekend. CSW will keep admissions director updated regarding patient's readiness for discharge. Weekend handoff completed.  Taylor Tyler, MSW, LCSW Licensed Clinical Social Worker [place] [phone]

## 2017-04-03 NOTE — Discharge Summary (Signed)
[facility] Discharge Summary  Patient name: Taylor Tyler record number: [id number] Date of birth: [birth year] Age: 76 y.o. Gender: female Date of Admission: 03/29/2017  Date of Discharge: 04/04/2017 Admitting Physician: Dickie La, MD  Primary Care Provider: Lenard Simmer, MD Consultants: Neurosurgery, Infectious Disease  Indication for Hospitalization: AMS   Discharge Diagnoses/Problem List:  Altered Mental Status Open back wound, s/p L3-4 and L4-5 laminectomies with L4-5 Gill procedure, L5-S1 laminectomy with facetectomy, posterior segmental instrumented fusion L3-S1 on 02/26/2017 AKI on CKD III  HTN Lower extremity edema, Chronic T2DM Hypothyroidism HLD  Disposition: back to SNF/ALF  Discharge Condition:  Stable.  Discharge Exam: Please see progress note from the day of discharge  Brief Hospital Course:  Patient was admitted for worsening AMS s/p recent back surgery. On admission, labs were significant for elevated BUN/Cr 87/2.87, elevated BNP 492.3. CT abdomen/pelvis was negative for acute abdominal findings. CT/MRI Head were negative for acute intracranial processes. Blood/Urine cultures were negative. Pt was started on broad spectrum antibiotics. Pt went for lumbar wound exploration and debridement. Wound back culture grew PROTEUS MIRABILIS. ID was consulted and antibiotics were narrowed based on culture sensitivities. Pt neuro status waxed and waned throughout hospital stay. During stay, pt was very hypertensive with wide pulse pressure averaging BP 160s-180s/50s-60s. It was difficult to control due to persistent bradycardia, need to avoid nephrotoxic agents due to AKI, and pt listed allergy to hydralazine. Patient was able to tolerate PO and AKI gradually improved.  Issues for Follow Up:  1. S/p neuro surgery Neurosurgery recommended keeping wound vac in place and follow up outpatient in 3 weeks. 2. HTN. Please cont to evaluate medical  management. Discharged with increased clonidine, HCTZ, and losartan but lasix was held due to AKI. Please restart as needed as creatinine improves. 3. AKI - will need Cr followed at follow up, restart above medications as creatinine improves 4. Mental status - waxing/waning, may be hospital delirium vs. New baseline. Please avoid sedating medications in this patient. Her mental status improved once narcotics, gabapentin and other sedating medications were stopped.   5. Pain control - continue acetaminophen standing TID for pain, can de-escalate as clinically indicated 6. Infection of lumbar back wound ID f/u. Continue renally-dosed levaquin x6 weeks (through 10/7). Dr. Carlos Levering said he would arrange follow-up in his clinic in early October.  7. Urinary retention Discharged with foley due to urinary retention, failed void trial. Can keep foley for two weeks, attempt void trial, and if fails to void consider outpatient urology consult.  Significant Procedures:  Patient underwent repeat washout of her lumbar spine by Neurosurgery 8/27  Significant Labs and Imaging:   Recent Labs Lab 04/02/17 0405 04/03/17 0318 04/04/17 0524  WBC 11.8* 10.7* 9.0  HGB 10.4* 9.5* 9.9*  HCT 32.0* 29.7* 30.9*  PLT 215 228 248    Recent Labs Lab 03/29/17 1323 03/29/17 2217 03/30/17 1818 04/01/17 0333 04/02/17 0405 04/03/17 0318 04/04/17 0524  NA 137  --  144 139 140 141 142  K 4.3  --  3.8 3.8 4.0 3.7 4.2  CL 105  --  115* 111 113* 113* 112*  CO2 21*  --  22 22 19* 23 23  GLUCOSE 118*  --  155* 97 81 105* 108*  BUN 87*  --  63* 70* 64* 62* 57*  CREATININE 2.87*  --  2.31* 2.67* 2.47* 2.29* 1.83*  CALCIUM 8.7*  --  8.3* 8.2* 8.2* 8.6* 8.8*  MG  --  2.1  --   --   --   --   --   PHOS  --  3.1  --   --   --   --   --   ALKPHOS 90  --  82  --   --   --   --   AST 28  --  29  --   --   --   --   ALT 18  --  18  --   --   --   --   ALBUMIN 2.4*  --  2.1*  --   --   --   --       Results/Tests Pending  at Time of Discharge: None  Discharge Medications:  Allergies as of 04/04/2017      Reactions   Clams [shellfish Allergy] Swelling, Other (See Comments)   THROAT SWELLS NECK TURNS RED   Hydralazine Other (See Comments)   CHEST TIGHTNESS      Medication List    STOP taking these medications   carvedilol 25 MG tablet Commonly known as:  COREG   cyclobenzaprine 5 MG tablet Commonly known as:  FLEXERIL   doxycycline 100 MG tablet Commonly known as:  VIBRA-TABS   furosemide 20 MG tablet Commonly known as:  LASIX   losartan 100 MG tablet Commonly known as:  COZAAR   metFORMIN 500 MG tablet Commonly known as:  GLUCOPHAGE   oxyCODONE-acetaminophen 5-325 MG tablet Commonly known as:  PERCOCET/ROXICET   oxyCODONE-acetaminophen 7.5-325 MG tablet Commonly known as:  PERCOCET     TAKE these medications   acetaminophen 500 MG tablet Commonly known as:  TYLENOL Take 2 tablets (1,000 mg total) by mouth 3 (three) times daily. What changed:  medication strength  how much to take  when to take this  reasons to take this   atorvastatin 40 MG tablet Commonly known as:  LIPITOR Take 40 mg by mouth every evening.   Baclofen 5 MG Tabs Take 5 mg by mouth 2 (two) times daily as needed for muscle spasms.   cloNIDine 0.3 MG tablet Commonly known as:  CATAPRES Take 1 tablet (0.3 mg total) by mouth 2 (two) times daily. What changed:  medication strength  how much to take   ergocalciferol 50000 units capsule Commonly known as:  VITAMIN D2 Take 50,000 Units by mouth once a week. On Tuesdays   ferrous sulfate 325 (65 FE) MG tablet Take 325 mg by mouth daily with breakfast.   hydrochlorothiazide 25 MG tablet Commonly known as:  HYDRODIURIL Take 25 mg by mouth daily.   levofloxacin 250 MG tablet Commonly known as:  LEVAQUIN Take 1 tablet (250 mg total) by mouth daily.   levothyroxine 88 MCG tablet Commonly known as:  SYNTHROID, LEVOTHROID Take 88 mcg by mouth daily  before breakfast.   Magnesium Oxide 250 MG Tabs Take 250 mg by mouth daily.   pantoprazole 40 MG tablet Commonly known as:  PROTONIX Take 1 tablet (40 mg total) by mouth at bedtime.   SOOTHE 0.6-0.6 % Soln Generic drug:  Propylene Glycol-Glycerin Place 1 drop into both eyes daily as needed (for dry/gritty eyes.).            Durable Medical Equipment        Start     Ordered   04/01/17 1014  For home use only DME Walker rolling  Once    Comments:  5"  Question:  Patient needs a walker to treat with  the following condition  Answer:  H/O spinal stenosis   04/01/17 1014   04/01/17 1013  For home use only DME 3 n 1  Once     04/01/17 1013       Discharge Care Instructions        Start     Ordered   04/05/17 0000  levofloxacin (LEVAQUIN) 250 MG tablet  Daily     04/04/17 0918   04/04/17 0000  acetaminophen (TYLENOL) 500 MG tablet  3 times daily     04/04/17 0918   04/04/17 0000  baclofen 5 MG TABS  2 times daily PRN     04/04/17 0918   04/04/17 0000  cloNIDine (CATAPRES) 0.3 MG tablet  2 times daily     04/04/17 0918   04/04/17 0000  pantoprazole (PROTONIX) 40 MG tablet  Daily at bedtime     04/04/17 2883      Discharge Instructions: Please refer to Patient Instructions section of EMR for full details.  Patient was counseled important signs and symptoms that should prompt return to medical care, changes in medications, dietary instructions, activity restrictions, and follow up appointments.   Follow-Up Appointments: DC to SNF  Everrett Coombe, MD 04/04/2017, 9:22 AM PGY-2, [place] [facility]

## 2017-04-03 NOTE — Consult Note (Addendum)
[facility] Nurse wound follow up Wound type: Back Vac dressing changed. Pt was in a large amt pain despite meds given earlier. Full thickness post-op wound to middle back Wound bed: beefy red Drainage (amount, consistency, odor) mod amt blood-tinged drainage Periwound: Intact skin surrounding Dressing procedure/placement/frequency: Requested to assess another wound to right lower abd; full thickness chronic wound 1.8X.3X.2cm, yellow dry wound bed.  Santyl for enzymatic debridement. Applied nonadherent contact layer, then one piece black foam to 130mm cont suction. Bridged track pad to flank to decrease pressure.  Applied piece of barrier ring to bottom of the wound to attempt to maintain a seal to the gluteal cleft  WOC team will plan to change dressing on Mon if patient is still here at that time. Julien Girt MSN, RN, [place], [place], [place]

## 2017-04-04 DIAGNOSIS — R404 Transient alteration of awareness: Secondary | ICD-10-CM

## 2017-04-04 DIAGNOSIS — E784 Other hyperlipidemia: Secondary | ICD-10-CM

## 2017-04-04 LAB — BASIC METABOLIC PANEL
ANION GAP: 7 (ref 5–15)
BUN: 57 mg/dL — ABNORMAL HIGH (ref 6–20)
CO2: 23 mmol/L (ref 22–32)
Calcium: 8.8 mg/dL — ABNORMAL LOW (ref 8.9–10.3)
Chloride: 112 mmol/L — ABNORMAL HIGH (ref 101–111)
Creatinine, Ser: 1.83 mg/dL — ABNORMAL HIGH (ref 0.44–1.00)
GFR calc Af Amer: 30 mL/min — ABNORMAL LOW (ref >60)
GFR calc non Af Amer: 26 mL/min — ABNORMAL LOW (ref >60)
GLUCOSE: 108 mg/dL — AB (ref 65–99)
POTASSIUM: 4.2 mmol/L (ref 3.5–5.1)
Sodium: 142 mmol/L (ref 135–145)

## 2017-04-04 LAB — GLUCOSE, CAPILLARY
GLUCOSE-CAPILLARY: 105 mg/dL — AB (ref 65–99)
GLUCOSE-CAPILLARY: 92 mg/dL (ref 65–99)
Glucose-Capillary: 101 mg/dL — ABNORMAL HIGH (ref 65–99)
Glucose-Capillary: 121 mg/dL — ABNORMAL HIGH (ref 65–99)

## 2017-04-04 LAB — CBC
HEMATOCRIT: 30.9 % — AB (ref 36.0–46.0)
Hemoglobin: 9.9 g/dL — ABNORMAL LOW (ref 12.0–15.0)
MCH: 27.1 pg (ref 26.0–34.0)
MCHC: 32 g/dL (ref 30.0–36.0)
MCV: 84.7 fL (ref 78.0–100.0)
Platelets: 248 10*3/uL (ref 150–400)
RBC: 3.65 MIL/uL — ABNORMAL LOW (ref 3.87–5.11)
RDW: 15.7 % — AB (ref 11.5–15.5)
WBC: 9 10*3/uL (ref 4.0–10.5)

## 2017-04-04 LAB — AEROBIC/ANAEROBIC CULTURE W GRAM STAIN (SURGICAL/DEEP WOUND)

## 2017-04-04 LAB — AEROBIC/ANAEROBIC CULTURE (SURGICAL/DEEP WOUND)

## 2017-04-04 MED ORDER — LEVOFLOXACIN 250 MG PO TABS: 250.0000 mg | ORAL_TABLET | Freq: Every day | ORAL | 0 refills | Status: AC

## 2017-04-04 MED ORDER — CLONIDINE HCL 0.3 MG PO TABS: 0.3000 mg | ORAL_TABLET | Freq: Two times a day (BID) | ORAL | 0 refills | Status: DC

## 2017-04-04 MED ORDER — PANTOPRAZOLE SODIUM 40 MG PO TBEC: 40.0000 mg | DELAYED_RELEASE_TABLET | Freq: Every day | ORAL | 0 refills | Status: DC

## 2017-04-04 MED ORDER — HYDRALAZINE HCL 20 MG/ML IJ SOLN
10.0000 mg | INTRAMUSCULAR | Status: DC | PRN
  Administered 2017-04-04 – 2017-04-06 (×4): 10 mg via INTRAVENOUS
  Filled 2017-04-04 (×5): qty 1

## 2017-04-04 MED ORDER — IBUPROFEN 400 MG PO TABS
400.0000 mg | ORAL_TABLET | Freq: Once | ORAL | Status: AC
  Administered 2017-04-04: 400 mg via ORAL
  Filled 2017-04-04: qty 1

## 2017-04-04 MED ORDER — BACLOFEN 5 MG PO TABS: 5.0000 mg | ORAL_TABLET | Freq: Two times a day (BID) | ORAL | 0 refills | Status: DC | PRN

## 2017-04-04 MED ORDER — ACETAMINOPHEN 500 MG PO TABS: 1000.0000 mg | ORAL_TABLET | Freq: Three times a day (TID) | ORAL | 0 refills | Status: DC

## 2017-04-04 MED ORDER — LOSARTAN POTASSIUM 50 MG PO TABS
100.0000 mg | ORAL_TABLET | Freq: Every day | ORAL | Status: DC
  Administered 2017-04-04 – 2017-04-07 (×4): 100 mg via ORAL
  Filled 2017-04-04 (×5): qty 2

## 2017-04-04 NOTE — Progress Notes (Signed)
CSW following to facilitate discharge to SNF. CSW checked in with facility this morning; facility indicated they would not be able to get wound vac until Monday, due to equipment company not being open on weekends. CSW alerted MD.   CSW checked in with RNCM to see if wound vac could be obtained for patient. Please see note from [facility] about supplies being available for discharge tomorrow.  CSW alerted facility that supplies would be available tomorrow for patient to transition back to facility. CSW will alert patient's sons.   CSW to follow to facilitate discharge tomorrow when facility can obtain supplies needed to care for patient.  [name], [place] Clinical Social Worker [phone]

## 2017-04-04 NOTE — Progress Notes (Signed)
CSW spoke with patient's son, Taylor Tyler, to inform him that patient will likely transition back to SNF tomorrow. Patient's son indicated understanding and expressed concern about making sure that her pain medication was prescribed correctly, that she not be on any Percoset whatsoever. Patient's son discussed how patient has been having serious mental capacity issues since the back surgery; she has been hallucinating, having difficulty remembering things, and not being oriented since she had the back surgery. Patient's son discussed how he is hopeful that it is from the pain medication; that the facility had been giving her pain medication around the clock, instead of as needed, and he did not want the patient to be receiving constant pain medication. Patient's son confirmed that patient had been fully independent prior to the previous surgery, and he was hopeful that she would be able to be independent again in the future, but that he was severely concerned about her current state.  CSW indicated to patient's son to follow up with medical team tomorrow to ensure that patient's discharge medications were correct to send to facility, and to contact MD or RN to discuss concerns. CSW to follow up tomorrow to facilitate discharge when medically ready and if supplies are available.  [name], [place] Clinical Social Worker [phone]

## 2017-04-04 NOTE — Progress Notes (Signed)
FPTS Interim Progress Note  S:Called by RN due to persistent HTN after HCTZ and losartan. Patient as baseline MS.   O: BP (!) 161/46 (BP Location: Left Arm)   Pulse (!) 51   Temp 98.6 F (37 C) (Oral)   Resp 16   Ht 5' (1.524 m)   Wt 237 lb 3.4 oz (107.6 kg)   SpO2 100%   BMI 46.33 kg/m     A/P: HTN: discussed with pharmacy given patient's allergy "chest tightness." Angina can be an adverse effect of hydralazine rather than an allergy. Patient received hydralazine already during this admission and tolerated well. Curious whether pain could be elevating BP as well.  -ibuprofen once for pain as limiting sedating meds -recheck BP in other extremity -hydralazine PRN SBP >180  Sela Hilding, MD 04/04/2017, 3:14 PM PGY-2, [place] [facility] Service pager [phone]

## 2017-04-04 NOTE — Progress Notes (Addendum)
[place] reps from [facility] and [facility], neither could provide interim VAC for use at SNF. Patient contraindicated to have W/D dressings at SNF until Monday. CM updated CSW.  14:30 Received call from Liliane Bade that portable VAC on it's way (ETA could be after 5:00) to portable equipment. Patient will be able to keep VAC at [facility] until [facility] comes in Monday at facility. CM updated Kathlee Nations CSW that [facility] will be available from portable equipment late today. Anticpate DC to SNF tomorrow. Please call portable equipment to get [facility] when ready.

## 2017-04-04 NOTE — Progress Notes (Signed)
Family Medicine Teaching Service Daily Progress Note Intern Pager: [phone]  Patient name: Taylor Tyler record number: [id number] Date of birth: 04/28/1941 Age: 76 y.o. Gender: female  Primary Care Provider: Lenard Simmer, MD Consultants: Neurosurgery Code Status:Full, unable to ask patient as she is altered  Pt Overview and Major Events to Date:  Taylor Tyler a 77 y.o.femalepresenting with AMS, diarrhea and vomiting, also planning for spinal procedure in OR with neurosurgery 8/27. PMH is significant for T2DM, CKD 3, HTN, HLD, chronic anemia, hypothyroidism, HLD, and spinal stenosis s/p neurosurgery operations in 01/2017 and 02/2017   Assessment and Plan: AMS-Waxing and waning neuro status. Etiology may be due to hospital delirium, infection given back wound, currently on antibiotics. Possible uremia given elevated Cr ~2.5. CVA not likely given Neg CT/MRI head negative for acute process. Severe dehydration with diarrhea was thought to be contributing, but pt has not inproved with IV and parentaral nutrition. TSH nl.  - s/p IV vanc/zosyn (08/29 - 08/30), narrowed to renally-dosed Levaquin to continue x 6W per ID recs. Patient to follow up with ID in early October - limit sedating meds - delirium precautions  Open back wound s/p neurosurgery - s/p L3-4 and L4-5 laminectomies with L4-5 Gill procedure, L5-S1 laminectomy with facetectomy, posterior segmental instrumented fusion L3-S1 on 02/26/2017. Wound culture Proteus mirabilis, pan sensitive  - Wound care appreciated - will follow up neurosurg recs - antibiotics as above - DC with wound vac, to follow up with neurosurg in 2 weeks  Urinary retention - patient failed void trial with >260 cc urine in bladder 4H S/p foley removal yesterday. Foley in place again - will need to be followed at SNF after DC  Diarrhea/vomiting- resolved. Abdominal pain has resolved. C diff negative in ED.  CT abdomen without acute abnormality. -  Zofran PRN nausea  AKI on CKD III - improved Cr at 1.83, from baseline 1.4-1.8. - recheck BMP at follow up  HTN - BP 150-191/49-63. HR 49-59. Home regimen includes coreg 25 mg BID, Clonidine 0.2 mg BID, lasix 20 mg daily, losartan 100 mg daily, hctz 25 mg daily, Holding nephrotoxic agents due to aki, and allergy to hydralazine - taking 0.3 mg clonididine, home HCTZ - hold betablockers due to bradycardia  LE edema, chronic - stable. uncertain etiology. 2+ on exam today. Nl lung exam. satting well on RA. Patient had echo 01/16/2017 which showed EF 55-60%, moderate LVH, no regional wall motion abnormalities. Albumin low at 2.4 which may contribute. Takes PO Lasix 20 mg daily at home. BNP elevated at 492.  - holding lasix for AKI and NPO, consider adding back lasix if aki improves - cont to monitor  T2DM- CBGs controlled overnight at ~100s, with no sliding scale required.  Patient is taking poor PO. last A1c 5.7 on 02/16/2017. Home regimen is metformin 500 mg BID.  - hold home metformin with AKI  - was on sliding scale, will DC - can add back metformin or sliding scale as outpatient as PO improves  Anemia, acute on chronic- stable at 9.9.  9-10 baseline. Home regimen includes Ferrous sulfate 325 mg daily. S/p 2 units rbc on 04/01/17. - restart home iron after acute infection has resolved  Hypothyroidism - TSH 0.984. On Synthyroid 100 mcg daily by 8/1 note.  - cont. Home dose  HLD- last lipid panel apparently not available in epic. Home regimen Atorva 40 mg QHS.  - continue statin  FEN/GI: Carb Modified Prophylaxis: heparin given kidney injury  Disposition:Plan for discharge back to [facility] Today  Subjective:  Patient's mentation is significantly improved for me this morning compared to when I admitted her. She is still slightly confused at times, but can tell me she is in a hospital, gives me the year, and recognizes her family at bedside. She becomes tearful when her family  enters the room, but converses normally with them and recognizes them.  Objective: Temp:  [97.6 F (36.4 C)-98.1 F (36.7 C)] 98.1 F (36.7 C) (08/31 2100) Pulse Rate:  [52-59] 59 (08/31 2100) BP: (190-191)/(63-91) 190/91 (08/31 2100) SpO2:  [100 %] 100 % (08/31 2100) Weight:  [237 lb 3.4 oz (107.6 kg)] 237 lb 3.4 oz (107.6 kg) (08/31 2100) Physical Exam: General: NAD, alert, pleasant, tearful at times Cardiovascular: RRR, no m/r/g Respiratory: CTA bil, no W/R/R Abdomen: soft, nontender, nondistended Psych: oriented to person, "hospital" but cant say which one, oriented to year, cannot state her birthday, recognizes family at bedside  Laboratory:  Recent Labs Lab 04/01/17 0333 04/02/17 0405 04/03/17 0318  WBC 10.2 11.8* 10.7*  HGB 7.8* 10.4* 9.5*  HCT 23.7* 32.0* 29.7*  PLT 214 215 228    Recent Labs Lab 03/29/17 1323 03/30/17 1818 04/01/17 0333 04/02/17 0405 04/03/17 0318  NA 137 144 139 140 141  K 4.3 3.8 3.8 4.0 3.7  CL 105 115* 111 113* 113*  CO2 21* 22 22 19* 23  BUN 87* 63* 70* 64* 62*  CREATININE 2.87* 2.31* 2.67* 2.47* 2.29*  CALCIUM 8.7* 8.3* 8.2* 8.2* 8.6*  PROT 6.5 5.8*  --   --   --   BILITOT 0.6 0.6  --   --   --   ALKPHOS 90 82  --   --   --   ALT 18 18  --   --   --   AST 28 29  --   --   --   GLUCOSE 118* 155* 97 81 105*    Imaging/Diagnostic Tests: Ct Abdomen Pelvis Wo Contrast: 03/29/2017 IMPRESSION: 1. No acute abdominopelvic findings identified. 2. Stable large ventral hernia without evidence of incarceration or obstruction. 3. Interval revision of lumbar fusion. There is an open wound containing apparent packing material. 4. Diffuse hepatic steatosis. 5.  Aortic Atherosclerosis (ICD10-I70.0).   Ct Head Wo Contrast 03/29/2017 FINDINGS: Brain: No subdural, epidural, or subarachnoid hemorrhage identified on today's study. Cerebellum, brainstem, and basal cisterns are within normal limits. No mass effect or midline shift. Ventricles and sulci  are unremarkable. No acute cortical ischemia or infarct. Vascular: Calcified atherosclerosis is seen in the intracranial carotid arteries. Skull: Normal. Negative for fracture or focal lesion. Sinuses/Orbits: There is a small amount of fluid or mucus in the sphenoid sinuses. This is similar compared to the previous study. Paranasal sinuses, mastoid air cells, and middle ears are otherwise normal. Other: There is a stable subcutaneous nodule in the posterior scalp on series 3, image 9 of no acute significance. Extracranial soft tissues are otherwise normal.  IMPRESSION:  1. No acute intracranial abnormality identified.  2. Mild sinus disease as above.   Mr Brain Wo Contrast 03/30/2017 FINDINGS: Brain: The midline structures are normal. There is no focal diffusion restriction to indicate acute infarct. There is mild multifocal leukoaraiosis, most commonly seen in the setting of chronic hypertensive microangiopathy. No intraparenchymal hematoma or chronic microhemorrhage. Brain volume is normal for age without lobar predominant atrophy. The dura is normal and there is no extra-axial collection. Vascular: Major intracranial arterial and venous sinus flow  voids are preserved. Skull and upper cervical spine: The visualized skull base, calvarium, upper cervical spine and extracranial soft tissues are normal. Sinuses/Orbits: No fluid levels or advanced mucosal thickening. No mastoid or middle ear effusion. Normal orbits.  IMPRESSION: Mild sequelae of chronic hypertensive microangiopathy. No acute abnormality.   Dg Chest Port 1 View 03/29/2017 FINDINGS: Portable AP semi upright view at 2129 hours. Stable lung volumes. Mild cardiomegaly. Other mediastinal contours are within normal limits. Allowing for portable technique the lungs are clear. No pneumothorax or pneumoperitoneum. Visible epigastric bowel gas pattern within normal limits.  IMPRESSION: No acute cardiopulmonary abnormality.    Everrett Coombe,  MD 04/04/2017, 6:23 AM PGY-2, [place] Intern pager: [phone], text pages welcome

## 2017-04-04 NOTE — Progress Notes (Signed)
Patient has been very tearful this morning, family is at the bedside

## 2017-04-05 ENCOUNTER — Telehealth: Payer: Self-pay | Admitting: Internal Medicine

## 2017-04-05 DIAGNOSIS — E034 Atrophy of thyroid (acquired): Secondary | ICD-10-CM

## 2017-04-05 LAB — GLUCOSE, CAPILLARY
GLUCOSE-CAPILLARY: 98 mg/dL (ref 65–99)
GLUCOSE-CAPILLARY: 87 mg/dL (ref 65–99)
Glucose-Capillary: 87 mg/dL (ref 65–99)
Glucose-Capillary: 83 mg/dL (ref 65–99)
Glucose-Capillary: 93 mg/dL (ref 65–99)

## 2017-04-05 NOTE — Telephone Encounter (Signed)
Opened in error

## 2017-04-05 NOTE — Progress Notes (Signed)
Family Medicine Teaching Service Daily Progress Note Intern Pager: [phone]  Patient name: Taylor Tyler record number: [id number] Date of birth: [birth year] Age: 76 y.o. Gender: female  Primary Care Provider: Lenard Simmer, MD Consultants: Neurosurgery Code Status:Full, unable to ask patient as she is altered  Pt Overview and Major Events to Date:  BRIONNE MERTZ a 76 y.o.femalepresenting with AMS, diarrhea and vomiting, also planning for spinal procedure in OR with neurosurgery 8/27. PMH is significant for T2DM, CKD 3, HTN, HLD, chronic anemia, hypothyroidism, HLD, and spinal stenosis s/p neurosurgery operations in 01/2017 and 02/2017   Assessment and Plan: AMS-Waxing and waning neuro status. Etiology likely multifactorial; hospital delirium, infection given back wound, possible uremia. CVA not likely given Neg CT/MRI head negative for acute process. Severe dehydration with diarrhea was thought to be contributing, but pt has not inproved with IV and parentaral nutrition. TSH nl. s/p IV vanc/zosyn (08/29 - 08/30), - Continue renally-dosed Levaquin to continue x 6W per ID recs. Patient to follow up with ID in early October - limit sedating meds - delirium precautions  Open back wound s/p neurosurgery - s/p L3-4 and L4-5 laminectomies with L4-5 Gill procedure, L5-S1 laminectomy with facetectomy, posterior segmental instrumented fusion L3-S1 on 02/26/2017. Wound culture Proteus mirabilis, pan sensitive . Neurosurgery and ID have been consulted throughout hospitalization - Wound care appreciated - antibiotics as above - Pain control: Tylenol 1000 mg TID, Oxy IR 5 mg q 8 PRN - DC with wound vac, to follow up with neurosurg in 2 weeks  Urinary retention - patient failed void trial with >260 cc urine in bladder 4H S/p foley removal on 8/31. Foley in place again - will need to be followed at SNF after DC  Diarrhea/vomiting- resolved. Abdominal pain has resolved. C diff negative in  ED.  CT abdomen without acute abnormality. - Zofran PRN nausea  AKI on CKD III - improved Cr at 1.83 yesterday, from baseline 1.4-1.8. - recheck BMP at follow up  HTN - Remains uncontrolled at times, has variable BPs. Home regimen includes coreg 25 mg BID, Clonidine 0.2 mg BID, lasix 20 mg daily, losartan 100 mg daily, hctz 25 mg daily, Holding Lasix due to aki, and allergy to hydralazine but has tolerated Hydralazine here - Continue 0.3 mg clonididine BID, HCTZ 25 mg daily, Losartan 100mg  daily - hold betablockers due to bradycardia  LE edema, chronic - stable. uncertain etiology. 1+ bilaterally in ankles. Nl lung exam. satting well on RA. Patient had echo 01/16/2017 which showed EF 55-60%, moderate LVH, no regional wall motion abnormalities. Albumin low at 2.4 which may contribute. Takes PO Lasix 20 mg daily at home. BNP elevated at 492.  - holding lasix for AKI, consider adding back lasix if aki improves - cont to monitor  T2DM- CBGs controlled, no insulin required.  Patient is taking poor PO. last A1c 5.7 on 02/16/2017. Home regimen is metformin 500 mg BID.  - hold home metformin with AKI  - can add back metformin or sliding scale as outpatient as PO improves  Anemia, acute on chronic- stable at 9.9.  9-10 baseline. Home regimen includes Ferrous sulfate 325 mg daily. S/p 2 units rbc on 04/01/17. - restart home iron after acute infection has resolved  Hypothyroidism - TSH 0.984. On Synthyroid 100 mcg daily by 8/1 note.  - cont. Home dose  HLD- last lipid panel apparently not available in epic. Home regimen Atorva 40 mg QHS.  - continue statin  FEN/GI: Carb Modified  Prophylaxis: heparin given kidney injury  Disposition:Plan for discharge back to [facility] Today  Subjective:  Patient remains altered but oriented to person and place, not time. Tearful when discussing missing her brother and sister.   Objective: Temp:  [98.2 F (36.8 C)-98.6 F (37 C)] 98.2 F (36.8  C) (09/02 0425) Pulse Rate:  [45-65] 54 (09/02 0425) Resp:  [14-18] 16 (09/02 0425) BP: (130-222)/(43-70) 196/60 (09/02 0425) SpO2:  [99 %-100 %] 100 % (09/02 0425) Weight:  [235 lb (106.6 kg)] 235 lb (106.6 kg) (09/01 2300) Physical Exam: General: NAD, alert, pleasant, tearful at times Cardiovascular: RRR, no m/r/g Respiratory: CTA bil, no W/R/R Abdomen: soft, nontender, nondistended Neuro: oriented to person, place, but not time.  Psych: emotionally labile, crying intermittently throughout exam but then smiling   Laboratory:  Recent Labs Lab 04/02/17 0405 04/03/17 0318 04/04/17 0524  WBC 11.8* 10.7* 9.0  HGB 10.4* 9.5* 9.9*  HCT 32.0* 29.7* 30.9*  PLT 215 228 248    Recent Labs Lab 03/29/17 1323 03/30/17 1818  04/02/17 0405 04/03/17 0318 04/04/17 0524  NA 137 144  < > 140 141 142  K 4.3 3.8  < > 4.0 3.7 4.2  CL 105 115*  < > 113* 113* 112*  CO2 21* 22  < > 19* 23 23  BUN 87* 63*  < > 64* 62* 57*  CREATININE 2.87* 2.31*  < > 2.47* 2.29* 1.83*  CALCIUM 8.7* 8.3*  < > 8.2* 8.6* 8.8*  PROT 6.5 5.8*  --   --   --   --   BILITOT 0.6 0.6  --   --   --   --   ALKPHOS 90 82  --   --   --   --   ALT 18 18  --   --   --   --   AST 28 29  --   --   --   --   GLUCOSE 118* 155*  < > 81 105* 108*  < > = values in this interval not displayed.  Imaging/Diagnostic Tests: Ct Abdomen Pelvis Wo Contrast: 03/29/2017 IMPRESSION: 1. No acute abdominopelvic findings identified. 2. Stable large ventral hernia without evidence of incarceration or obstruction. 3. Interval revision of lumbar fusion. There is an open wound containing apparent packing material. 4. Diffuse hepatic steatosis. 5.  Aortic Atherosclerosis (ICD10-I70.0).   Ct Head Wo Contrast 03/29/2017 FINDINGS: Brain: No subdural, epidural, or subarachnoid hemorrhage identified on today's study. Cerebellum, brainstem, and basal cisterns are within normal limits. No mass effect or midline shift. Ventricles and sulci are  unremarkable. No acute cortical ischemia or infarct. Vascular: Calcified atherosclerosis is seen in the intracranial carotid arteries. Skull: Normal. Negative for fracture or focal lesion. Sinuses/Orbits: There is a small amount of fluid or mucus in the sphenoid sinuses. This is similar compared to the previous study. Paranasal sinuses, mastoid air cells, and middle ears are otherwise normal. Other: There is a stable subcutaneous nodule in the posterior scalp on series 3, image 9 of no acute significance. Extracranial soft tissues are otherwise normal.  IMPRESSION:  1. No acute intracranial abnormality identified.  2. Mild sinus disease as above.   Mr Brain Wo Contrast 03/30/2017 FINDINGS: Brain: The midline structures are normal. There is no focal diffusion restriction to indicate acute infarct. There is mild multifocal leukoaraiosis, most commonly seen in the setting of chronic hypertensive microangiopathy. No intraparenchymal hematoma or chronic microhemorrhage. Brain volume is normal for age without lobar predominant atrophy.  The dura is normal and there is no extra-axial collection. Vascular: Major intracranial arterial and venous sinus flow voids are preserved. Skull and upper cervical spine: The visualized skull base, calvarium, upper cervical spine and extracranial soft tissues are normal. Sinuses/Orbits: No fluid levels or advanced mucosal thickening. No mastoid or middle ear effusion. Normal orbits.  IMPRESSION: Mild sequelae of chronic hypertensive microangiopathy. No acute abnormality.   Dg Chest Port 1 View 03/29/2017 FINDINGS: Portable AP semi upright view at 2129 hours. Stable lung volumes. Mild cardiomegaly. Other mediastinal contours are within normal limits. Allowing for portable technique the lungs are clear. No pneumothorax or pneumoperitoneum. Visible epigastric bowel gas pattern within normal limits.  IMPRESSION: No acute cardiopulmonary abnormality.    Carlyle Dolly,  MD 04/05/2017, 7:37 AM PGY-3, [place] Intern pager: [phone], text pages welcome

## 2017-04-05 NOTE — Discharge Summary (Signed)
[facility] Discharge Summary  Patient name: Taylor Tyler record number: [id number] Date of birth: January 21, 1941 Age: 76 y.o. Gender: female Date of Admission: 03/29/2017  Date of Discharge: 04/06/2017 Admitting Physician: Dickie La, MD  Primary Care Provider: Lenard Simmer, MD Consultants: Neurosurgery, Infectious Disease  Indication for Hospitalization: AMS   Discharge Diagnoses/Problem List:  Altered Mental Status Open back wound, s/p L3-4 and L4-5 laminectomies with L4-5 Gill procedure, L5-S1 laminectomy with facetectomy, posterior segmental instrumented fusion L3-S1 on 02/26/2017 AKI on CKD III  HTN Lower extremity edema, Chronic T2DM Hypothyroidism HLD  Disposition: back to SNF/ALF  Discharge Condition:  Stable.  Discharge Exam:  General: NAD, intermittently sleepy, answers questions Cardiovascular: RRR, no m/r/g Respiratory: CTA bil, no W/R/R Abdomen: soft, nontender, nondistended Ext: 1+ pitting edema B/l to shin Neuro: able to state full name, place, unable to state city or year. Psych: mood and affect nl   Brief Hospital Course:  Patient was admitted for worsening AMS s/p recent back surgery. On admission, labs were significant for elevated BUN/Cr 87/2.87, elevated BNP 492.3. CT abdomen/pelvis was negative for acute abdominal findings. CT/MRI Head were negative for acute intracranial processes. Blood/Urine cultures were negative. Pt was started on broad spectrum antibiotics. Pt went for lumbar wound exploration and debridement. Wound back culture grew PROTEUS MIRABILIS. ID was consulted and antibiotics were narrowed based on culture sensitivities. Pt neuro status waxed and waned throughout hospital stay. During stay, pt was very hypertensive with wide pulse pressure averaging BP 160s-180s/50s-60s. It was difficult to control due to persistent bradycardia, need to avoid nephrotoxic agents due to AKI, and pt listed allergy to  hydralazine.  Norvasc 5mg  was added prior to discharge with blood pressure stabilization. Patient was able to tolerate PO and AKI gradually improved.  Issues for Follow Up:  1. S/p neuro surgery Neurosurgery recommended keeping wound vac in place and follow up outpatient in 3 weeks. 2. HTN. Please cont to evaluate medical management. Discharged with increased clonidine, HCTZ, and losartan, but lasix was held due to AKI. Please restart as needed as creatinine improves. Patient was also started on Norvasc 5 mg.  3. AKI - will need Cr followed at follow up, restart above medications as creatinine improves 4. Mental status - waxing/waning, may be hospital delirium vs. New baseline. Please avoid sedating medications in this patient. Her mental status improved once narcotics, gabapentin and other sedating medications were stopped.   5. Pain control - continue acetaminophen standing TID for pain, can de-escalate as clinically indicated 6. Infection of lumbar back wound ID f/u. Continue renally-dosed levaquin x6 weeks (through 10/7). Dr. Carlos Levering said he would arrange follow-up in his clinic in early October.  7. Urinary retention Discharged with foley due to urinary retention, failed void trial. Can keep foley for two weeks, attempt void trial, and if fails to void consider outpatient urology consult.  Significant Procedures:  Patient underwent repeat washout of her lumbar spine by Neurosurgery 8/27  Significant Labs and Imaging:   Recent Labs Lab 04/02/17 0405 04/03/17 0318 04/04/17 0524  WBC 11.8* 10.7* 9.0  HGB 10.4* 9.5* 9.9*  HCT 32.0* 29.7* 30.9*  PLT 215 228 248    Recent Labs Lab 04/01/17 0333 04/02/17 0405 04/03/17 0318 04/04/17 0524 04/06/17 1109  NA 139 140 141 142 141  K 3.8 4.0 3.7 4.2 4.5  CL 111 113* 113* 112* 110  CO2 22 19* 23 23 25   GLUCOSE 97 81 105* 108* 137*  BUN  70* 64* 62* 57* 49*  CREATININE 2.67* 2.47* 2.29* 1.83* 1.97*  CALCIUM 8.2* 8.2* 8.6* 8.8* 9.0       Results/Tests Pending at Time of Discharge: None  Discharge Medications:  Allergies as of 04/07/2017      Reactions   Clams [shellfish Allergy] Swelling, Other (See Comments)   THROAT SWELLS NECK TURNS RED   Hydralazine Other (See Comments)   CHEST TIGHTNESS   Milk-related Compounds Diarrhea      Medication List    STOP taking these medications   carvedilol 25 MG tablet Commonly known as:  COREG   cyclobenzaprine 5 MG tablet Commonly known as:  FLEXERIL   doxycycline 100 MG tablet Commonly known as:  VIBRA-TABS   furosemide 20 MG tablet Commonly known as:  LASIX   metFORMIN 500 MG tablet Commonly known as:  GLUCOPHAGE   oxyCODONE-acetaminophen 5-325 MG tablet Commonly known as:  PERCOCET/ROXICET   oxyCODONE-acetaminophen 7.5-325 MG tablet Commonly known as:  PERCOCET     TAKE these medications   acetaminophen 500 MG tablet Commonly known as:  TYLENOL Take 2 tablets (1,000 mg total) by mouth 3 (three) times daily. What changed:  medication strength  how much to take  when to take this  reasons to take this   amLODipine 5 MG tablet Commonly known as:  NORVASC Take 1 tablet (5 mg total) by mouth daily.   atorvastatin 40 MG tablet Commonly known as:  LIPITOR Take 40 mg by mouth every evening.   Baclofen 5 MG Tabs Take 5 mg by mouth 2 (two) times daily as needed for muscle spasms.   cloNIDine 0.3 MG tablet Commonly known as:  CATAPRES Take 1 tablet (0.3 mg total) by mouth 2 (two) times daily. What changed:  medication strength  how much to take   ergocalciferol 50000 units capsule Commonly known as:  VITAMIN D2 Take 50,000 Units by mouth once a week. On Tuesdays   ferrous sulfate 325 (65 FE) MG tablet Take 325 mg by mouth daily with breakfast.   hydrochlorothiazide 25 MG tablet Commonly known as:  HYDRODIURIL Take 25 mg by mouth daily.   levofloxacin 250 MG tablet Commonly known as:  LEVAQUIN Take 1 tablet (250 mg total) by mouth  daily.   levothyroxine 88 MCG tablet Commonly known as:  SYNTHROID, LEVOTHROID Take 88 mcg by mouth daily before breakfast.   losartan 100 MG tablet Commonly known as:  COZAAR Take 100 mg by mouth daily after breakfast.   Magnesium Oxide 250 MG Tabs Take 250 mg by mouth daily.   pantoprazole 40 MG tablet Commonly known as:  PROTONIX Take 1 tablet (40 mg total) by mouth at bedtime.   SOOTHE 0.6-0.6 % Soln Generic drug:  Propylene Glycol-Glycerin Place 1 drop into both eyes daily as needed (for dry/gritty eyes.).            Durable Medical Equipment        Start     Ordered   04/01/17 1014  For home use only DME Walker rolling  Once    Comments:  5"  Question:  Patient needs a walker to treat with the following condition  Answer:  H/O spinal stenosis   04/01/17 1014   04/01/17 1013  For home use only DME 3 n 1  Once     04/01/17 1013       Discharge Care Instructions        Start     Ordered   04/07/17 0000  Diet - low sodium heart healthy     04/07/17 1329   04/06/17 0000  amLODipine (NORVASC) 5 MG tablet  Daily     04/06/17 1024   04/05/17 0000  levofloxacin (LEVAQUIN) 250 MG tablet  Daily     04/04/17 0918   04/04/17 0000  acetaminophen (TYLENOL) 500 MG tablet  3 times daily     04/04/17 0918   04/04/17 0000  baclofen 5 MG TABS  2 times daily PRN     04/04/17 0918   04/04/17 0000  cloNIDine (CATAPRES) 0.3 MG tablet  2 times daily     04/04/17 0918   04/04/17 0000  pantoprazole (PROTONIX) 40 MG tablet  Daily at bedtime     04/04/17 1287      Discharge Instructions: Please refer to Patient Instructions section of EMR for full details.  Patient was counseled important signs and symptoms that should prompt return to medical care, changes in medications, dietary instructions, activity restrictions, and follow up appointments.   Follow-Up Appointments: Contact information for after-discharge care    Destination    [facility] .    Specialty:  [place] information: [address] [place] to SNF  Rory Percy, DO 04/07/2017, 1:30 PM PGY-3, [place]

## 2017-04-06 DIAGNOSIS — T814XXD Infection following a procedure, subsequent encounter: Secondary | ICD-10-CM

## 2017-04-06 DIAGNOSIS — I10 Essential (primary) hypertension: Secondary | ICD-10-CM

## 2017-04-06 LAB — BASIC METABOLIC PANEL
Anion gap: 6 (ref 5–15)
BUN: 49 mg/dL — AB (ref 6–20)
CALCIUM: 9 mg/dL (ref 8.9–10.3)
CO2: 25 mmol/L (ref 22–32)
CREATININE: 1.97 mg/dL — AB (ref 0.44–1.00)
Chloride: 110 mmol/L (ref 101–111)
GFR calc non Af Amer: 24 mL/min — ABNORMAL LOW (ref >60)
GFR, EST AFRICAN AMERICAN: 28 mL/min — AB (ref >60)
GLUCOSE: 137 mg/dL — AB (ref 65–99)
Potassium: 4.5 mmol/L (ref 3.5–5.1)
Sodium: 141 mmol/L (ref 135–145)

## 2017-04-06 LAB — GLUCOSE, CAPILLARY
Glucose-Capillary: 103 mg/dL — ABNORMAL HIGH (ref 65–99)
Glucose-Capillary: 136 mg/dL — ABNORMAL HIGH (ref 65–99)
Glucose-Capillary: 107 mg/dL — ABNORMAL HIGH (ref 65–99)
Glucose-Capillary: 109 mg/dL — ABNORMAL HIGH (ref 65–99)

## 2017-04-06 MED ORDER — AMLODIPINE BESYLATE 5 MG PO TABS: 5.0000 mg | ORAL_TABLET | Freq: Every day | ORAL | Status: DC

## 2017-04-06 MED ORDER — AMLODIPINE BESYLATE 5 MG PO TABS
5.0000 mg | ORAL_TABLET | Freq: Every day | ORAL | Status: DC
  Administered 2017-04-06 – 2017-04-07 (×2): 5 mg via ORAL
  Filled 2017-04-06 (×2): qty 1

## 2017-04-06 NOTE — Consult Note (Signed)
[facility] Nurse wound follow up Wound type: Surgical. Routine change of NPWT dressing. Patient is premedicated prior to dressing change by bedside RN. Measurement:16cm x 6cm x 3cm  Wound bed: Red, moist and bleeding upon dressing removal at most distal portion of wound.  Coagulation achieved after holding pressure for 2-3 minutes. Drainage (amount, consistency, odor) serosanguinous Periwound: Three (3) new areas of periwound medical adhesive related skin injury (MARSI) noted in the periwound area related to V.A.C. drape.  Right flank with 12cm x 0.4cm area of maroon discoloration with no break in integument noted.  Small intact, serum-filled blister noted 4cm above the most proximal portion of wound measuring 1cm x 1.6cm.  Area of partial thickness tissue loss measuring 16cm x 0.4cm x 0.1cm along the left flank. Pink, moist wound bed with scant serous drainage.  All of these areas are covered with hydrocolloid (thin) dressing to enhance tissue recovery.  NPWT drape will be applied on top of the hydrocolloid dressings to prevent further tissue trauma. Dressing procedure/placement/frequency: I am assisted in today's dressing change by the patient's bedside RN Cardell Peach) and by my partner, K. Sanders. Both are appreciated. Wound contact layers removed (one piece of non-adherent contact layer (Mepitel) and one piece of black foam). Wound cleansed with NS and bleeding area held until homeostasis achieved. Two one-half hydrocolloid "rings" are placed at the most distal portion of the wound to enhance drape/dressing seal. Two pieces of Mepitel non adherent wound contact layer placed in wound bed, one piece of black foam used to obliterate dead space. Drape applied to secure foam and hydrocolloid pieces, then drape applied to right flank in preparation for foam bridge placement.  Foam placed on top of drape and then covered with additional drape. Aperture cut into foam and T.R.A.C. pad applied. Attached to 115mmHg continuous  negative pressure and an immediate seal is achieved. Dressing is labeled with date/time/number and type of wound contact layers used. Cannister is not changed today as patient is being discharged to [facility] facility. Bedside RN is instructed in the disconnection of the NPWT device at the dressing/pump connection including to clamp off the connection-to-patient tubing for time of discharge.  Gauze dressing to be applied to end of tubing and secured with tape.  Tubing is to be connected/negative pressure is to be reinitiated at facility within 2 hours of discharge to avoid the need for a new dressing change. Next dressing change is due on Wednesday, April 08, 2017. [facility] nursing team will not follow after discharge, but will remain available to this patient, the nursing and medical teams.   Thanks, Maudie Flakes, MSN, RN, [place], Arther Abbott  Pager# [phone]

## 2017-04-06 NOTE — Progress Notes (Signed)
CSW spoke to [name] at [facility] and SNF now has patient's wound vac at facility and are ready to take patient. Per MD, patient will not discharge today due to uncontrolled BP. CSW called patient's son, Taylor Tyler, and gave him an update. CSW to continue to follow and support with discharge to SNF.  Estanislado Emms, [place]

## 2017-04-06 NOTE — Progress Notes (Signed)
B/P is 175/40 and HR is 51. MD notified. No further orders. Will continue to monitor.

## 2017-04-06 NOTE — Progress Notes (Signed)
Family Medicine Teaching Service Daily Progress Note Intern Pager: [phone]  Patient name: Taylor Tyler record number: [id number] Date of birth: Nov 28, 1940 Age: 76 y.o. Gender: female  Primary Care Provider: Lenard Simmer, MD Consultants: Neurosurgery Code Status:Full, unable to ask patient as she is altered  Pt Overview and Major Events to Date:  Taylor Tyler a 76 y.o.femalepresenting with AMS, diarrhea and vomiting, also lumbar wound dehiscence with exploration, irrigation and debridement with wound vac placement in OR with neurosurgery 8/27. PMH is significant for T2DM, CKD 3, HTN, HLD, chronic anemia, hypothyroidism, HLD, and spinal stenosis s/p neurosurgery operations in 01/2017 and 02/2017   Assessment and Plan: AMS-Waxing and waning neuro status. Etiology likely multifactorial; hospital delirium, infection given back wound, possible uremia. CVA not likely given Neg CT/MRI head negative for acute process. Severe dehydration with diarrhea was thought to be contributing, but pt has not improved with IV and parentaral nutrition. TSH nl. s/p IV vanc/zosyn (08/29 - 08/30), - Continue renally-dosed Levaquin to continue x 6wks per ID recs. Patient to follow up with ID in early October - limit sedating meds - delirium precautions  Open back wound s/p neurosurgery - s/p L3-4 and L4-5 laminectomies with L4-5 Gill procedure, L5-S1 laminectomy with facetectomy, posterior segmental instrumented fusion L3-S1 on 02/26/2017. Wound culture Proteus mirabilis, pan sensitive. Neurosurgery and ID have been consulted throughout hospitalization - Wound care appreciated - antibiotics as above - Pain control: Tylenol 1000 mg TID, Oxy IR 5 mg q 8 PRN - DC with wound vac, to follow up with neurosurg in 2 weeks  Urinary retention - patient failed void trial with >260 cc urine in bladder 4H S/p foley removal on 8/31. Foley in place again. - will need to be followed at SNF after  DC  Diarrhea/vomiting- resolved. Abdominal pain has resolved. C diff negative in ED.  CT abdomen without acute abnormality. - Zofran PRN nausea  AKI on CKD III - improved Cr at 1.97 this am (down from 2.67 on admission), from baseline 1.4-1.8. - recheck BMP at follow up  HTN - Remains uncontrolled at times, has variable BPs. Home regimen includes coreg 25 mg BID, Clonidine 0.2 mg BID, lasix 20 mg daily, losartan 100 mg daily, hctz 25 mg daily, Holding Lasix due to aki, and allergy to hydralazine but has tolerated Hydralazine here.  Last BP 175/40 with pulse 51. Patient is at high risk for falls, so adjustments to lower BP must be done cautiously.  - Continue 0.3 mg clonididine BID, HCTZ 25 mg daily, Losartan 100mg  daily, Norvasc 5mg  daily - Consider increasing Norvasc to 10mg  if needed  - hold betablockers due to bradycardia  LE edema, chronic - stable. uncertain etiology. 2+ bilaterally up to knee. Nl lung exam. satting well on RA. Patient had echo 01/16/2017 which showed EF 55-60%, moderate LVH, no regional wall motion abnormalities. Albumin low at 2.4 which may contribute. Takes PO Lasix 20 mg daily at home. BNP elevated at 492.  - holding lasix for AKI, consider adding back lasix if aki improves - cont to monitor  T2DM- CBGs controlled, no insulin required.  Patient is taking poor PO. last A1c 5.7 on 02/16/2017. Home regimen is metformin 500 mg BID.  - hold home metformin with AKI  - can add back metformin or sliding scale as outpatient as PO improves  Anemia, acute on chronic- stable at 9.9. 9-10 baseline. Home regimen includes Ferrous sulfate 325 mg daily. S/p 2 units rbc on 04/01/17. - restart  home iron after acute infection has resolved  Hypothyroidism - TSH 0.984. On Synthyroid 100 mcg daily by 8/1 note.  - cont. Home dose  HLD- last lipid panel apparently not available in epic. Home regimen Atorva 40 mg QHS.  - continue statin  FEN/GI: Carb Modified Prophylaxis:  heparin given kidney injury  Disposition:Plan for discharge back to [facility] pending blood pressure readings.  Subjective:  Patient has no concerns at this time.  Understands she will follow up with orthopedics regarding her wound. Able to state name, hospital, city. Unable to state birthdate.  Objective: Temp:  [98.3 F (36.8 C)-99.7 F (37.6 C)] 98.7 F (37.1 C) (09/03 0734) Pulse Rate:  [55-65] 65 (09/03 0734) Resp:  [14-18] 16 (09/03 0734) BP: (152-195)/(40-45) 159/41 (09/03 0734) SpO2:  [96 %-100 %] 98 % (09/03 0734) Weight:  [106 kg (233 lb 11 oz)] 106 kg (233 lb 11 oz) (09/02 2015)   Physical Exam: General: NAD, alert, pleasant Cardiovascular: RRR, no m/r/g Respiratory: CTA bil, no W/R/R Abdomen: soft, nontender, nondistended Ext: 2+ pitting edema B/l to knee Neuro: able to state full name, place, city, unable to state birthdate. Psych: mood and affect nl   Laboratory:  Recent Labs Lab 04/02/17 0405 04/03/17 0318 04/04/17 0524  WBC 11.8* 10.7* 9.0  HGB 10.4* 9.5* 9.9*  HCT 32.0* 29.7* 30.9*  PLT 215 228 248    Recent Labs Lab 03/30/17 1818  04/02/17 0405 04/03/17 0318 04/04/17 0524  NA 144  < > 140 141 142  K 3.8  < > 4.0 3.7 4.2  CL 115*  < > 113* 113* 112*  CO2 22  < > 19* 23 23  BUN 63*  < > 64* 62* 57*  CREATININE 2.31*  < > 2.47* 2.29* 1.83*  CALCIUM 8.3*  < > 8.2* 8.6* 8.8*  PROT 5.8*  --   --   --   --   BILITOT 0.6  --   --   --   --   ALKPHOS 82  --   --   --   --   ALT 18  --   --   --   --   AST 29  --   --   --   --   GLUCOSE 155*  < > 81 105* 108*  < > = values in this interval not displayed.  Imaging/Diagnostic Tests: Ct Abdomen Pelvis Wo Contrast: 03/29/2017 IMPRESSION: 1. No acute abdominopelvic findings identified. 2. Stable large ventral hernia without evidence of incarceration or obstruction. 3. Interval revision of lumbar fusion. There is an open wound containing apparent packing material. 4. Diffuse hepatic  steatosis. 5.  Aortic Atherosclerosis (ICD10-I70.0).   Ct Head Wo Contrast 03/29/2017 FINDINGS: Brain: No subdural, epidural, or subarachnoid hemorrhage identified on today's study. Cerebellum, brainstem, and basal cisterns are within normal limits. No mass effect or midline shift. Ventricles and sulci are unremarkable. No acute cortical ischemia or infarct. Vascular: Calcified atherosclerosis is seen in the intracranial carotid arteries. Skull: Normal. Negative for fracture or focal lesion. Sinuses/Orbits: There is a small amount of fluid or mucus in the sphenoid sinuses. This is similar compared to the previous study. Paranasal sinuses, mastoid air cells, and middle ears are otherwise normal. Other: There is a stable subcutaneous nodule in the posterior scalp on series 3, image 9 of no acute significance. Extracranial soft tissues are otherwise normal.  IMPRESSION:  1. No acute intracranial abnormality identified.  2. Mild sinus disease as above.  Mr Brain Wo Contrast 03/30/2017 FINDINGS: Brain: The midline structures are normal. There is no focal diffusion restriction to indicate acute infarct. There is mild multifocal leukoaraiosis, most commonly seen in the setting of chronic hypertensive microangiopathy. No intraparenchymal hematoma or chronic microhemorrhage. Brain volume is normal for age without lobar predominant atrophy. The dura is normal and there is no extra-axial collection. Vascular: Major intracranial arterial and venous sinus flow voids are preserved. Skull and upper cervical spine: The visualized skull base, calvarium, upper cervical spine and extracranial soft tissues are normal. Sinuses/Orbits: No fluid levels or advanced mucosal thickening. No mastoid or middle ear effusion. Normal orbits.  IMPRESSION: Mild sequelae of chronic hypertensive microangiopathy. No acute abnormality.   Dg Chest Port 1 View 03/29/2017 FINDINGS: Portable AP semi upright view at 2129 hours. Stable lung  volumes. Mild cardiomegaly. Other mediastinal contours are within normal limits. Allowing for portable technique the lungs are clear. No pneumothorax or pneumoperitoneum. Visible epigastric bowel gas pattern within normal limits.  IMPRESSION: No acute cardiopulmonary abnormality.    Rory Percy, DO 04/06/2017, 8:58 AM PGY-1, [place] Intern pager: [phone], text pages welcome

## 2017-04-06 NOTE — Consult Note (Signed)
[place] Nurse wound follow up [place] RN here for NPWT dressing change and learns that patient is to be discharged today to [facility]. Two calls placed to that facility to determine what brand of NPWT they use; dressing change is due today and we must know what brand of NPWT they have there to determine what dressing to place here prior to discharge. Standing by for connection to staff at [facility].  Thanks, Maudie Flakes, MSN, RN, [place], Arther Abbott  Pager# [phone]

## 2017-04-06 NOTE — Progress Notes (Signed)
Patients BP was 188/68 and pulse was 67 at 0930. MD notified. Orders placed for Amlodipine. Orders followed. Will continue to monitor.

## 2017-04-07 LAB — GLUCOSE, CAPILLARY
GLUCOSE-CAPILLARY: 171 mg/dL — AB (ref 65–99)
Glucose-Capillary: 115 mg/dL — ABNORMAL HIGH (ref 65–99)
Glucose-Capillary: 113 mg/dL — ABNORMAL HIGH (ref 65–99)

## 2017-04-07 NOTE — Care Management Important Message (Signed)
Important Message  Patient Details  Name: Taylor Tyler MRN: [id number] Date of Birth: January 05, 1941   Medicare Important Message Given:  Yes    Nathen May 04/07/2017, 10:53 AM

## 2017-04-07 NOTE — Progress Notes (Signed)
Patient Discharge: Disposition: Patient discharged to [facility], given report to nurse [facility] and answered all her questions. IV: Discontinued IV before discharge. Telemetry: N/A SW called the EMS to pick the patient.  Will continue to monitor the patient.

## 2017-04-07 NOTE — Clinical Social Work Note (Signed)
Taylor Tyler is medically stable for discharge back to [facility] today per MD. Facility admissions director [name] contacted and informed and d/c clinicals transmitted to [facility] [employer]. Patient's son [name] ([phone]) contacted and informed of discharge by phone and CSW visited with patient to inform her of discharge.. Ms. Odonnel will be transported back to facility via ambulance. CSW signing off as no other intervention services needed.  Donzel Romack Givens, MSW, LCSW Licensed Clinical Social Worker [place] [phone]

## 2017-04-07 NOTE — Progress Notes (Signed)
Discontinued the wound vac tubing and clamped the other tubing as the facility has compatible wound vac which can be easily connected.  Patient took all her belongings with her,  EMS picked the patient.

## 2017-04-07 NOTE — Progress Notes (Signed)
Family Medicine Teaching Service Daily Progress Note Intern Pager: [phone]  Patient name: Taylor Tyler record number: [id number] Date of birth: 29-Aug-1940 Age: 76 y.o. Gender: female  Primary Care Provider: Lenard Simmer, MD Consultants: Neurosurgery Code Status:Full, unable to ask patient as she is altered  Pt Overview and Major Events to Date:  Taylor Tyler a 76 y.o.femalepresenting with AMS, diarrhea and vomiting, also lumbar wound dehiscence with exploration, irrigation and debridement with wound vac placement in OR with neurosurgery 8/27. PMH is significant for T2DM, CKD 3, HTN, HLD, chronic anemia, hypothyroidism, HLD, and spinal stenosis s/p neurosurgery operations in 01/2017 and 02/2017   Assessment and Plan: AMS-Waxing and waning neuro status. Etiology likely multifactorial; hospital delirium, infection given back wound, possible uremia. CVA not likely given Neg CT/MRI head negative for acute process. Severe dehydration with diarrhea was thought to be contributing, but pt has not improved with IV and parentaral nutrition. TSH nl. s/p IV vanc/zosyn (08/29 - 08/30), - Continue renally-dosed Levaquin to continue x 6wks per ID recs. Patient to follow up with ID in early October - limit sedating meds - delirium precautions  Open back wound s/p neurosurgery - s/p L3-4 and L4-5 laminectomies with L4-5 Gill procedure, L5-S1 laminectomy with facetectomy, posterior segmental instrumented fusion L3-S1 on 02/26/2017. Wound culture Proteus mirabilis, pan sensitive. Neurosurgery and ID have been consulted throughout hospitalization - Wound care appreciated - antibiotics as above - Pain control: Tylenol 1000 mg TID, Oxy IR 5 mg q 8 PRN - DC with wound vac, to follow up with neurosurg in 2 weeks - next dressing change is Wednesday 9/5.  Urinary retention - patient failed void trial with >260 cc urine in bladder 4H S/p foley removal on 8/31. Foley in place again. - will need to  be followed at SNF after DC  Diarrhea/vomiting- resolved. Abdominal pain has resolved. C diff negative in ED.  CT abdomen without acute abnormality. - Zofran PRN nausea  AKI on CKD III - improved Cr at 1.97 this am (down from 2.67 on admission), from baseline 1.4-1.8. - recheck BMP at follow up  HTN - Remains uncontrolled at times, has variable BPs. Has remained in 170s with highest  175 since yesterday at noon. Home regimen includes coreg 25 mg BID, Clonidine 0.2 mg BID, lasix 20mg , losartan 100 mg daily, hctz 25 mg daily, Holding Lasix due to aki, and allergy to hydralazine but has tolerated Hydralazine here (last given 8/27).  Added Norvasc 5mg  to regimen yesterday, seeing improvement. Last BP 155/59 with pulse 60 at 0835. Patient is at high risk for falls, so adjustments to lower BP must be done cautiously.  - Continue 0.3 mg clonididine BID, HCTZ 25 mg daily, Losartan 100mg  daily, Norvasc 5mg  daily - Consider increasing Norvasc to 10mg  if needed  - hold betablockers due to bradycardia  LE edema, chronic - stable. uncertain etiology. 1+ bilaterally up to shin. Nl lung exam. satting well on RA. Patient had echo 01/16/2017 which showed EF 55-60%, moderate LVH, no regional wall motion abnormalities. Albumin low at 2.4 which may contribute. Takes PO Lasix 20 mg daily at home. BNP elevated at 492.  - holding lasix for AKI, consider adding back lasix if aki improves - cont to monitor  T2DM- CBGs controlled, no insulin required.  Patient is taking poor PO. last A1c 5.7 on 02/16/2017. Home regimen is metformin 500 mg BID. Last BG 115 this am. - hold home metformin with AKI  - can add back metformin  or sliding scale as outpatient as PO improves  Anemia, acute on chronic- stable at 9.0. 9-10 baseline. Home regimen includes Ferrous sulfate 325 mg daily. S/p 2 units rbc on 03/31/17. - restart home iron after acute infection has resolved  Hypothyroidism - TSH 0.984. On Synthyroid 100 mcg daily by  8/1 note.  - cont. Home dose  HLD- last lipid panel apparently not available in epic. Home regimen Atorva 40 mg QHS.  - continue statin  FEN/GI: Carb Modified Prophylaxis: heparin given kidney injury  Disposition:Plan for discharge back to [facility] today pending blood pressure readings.  Subjective:  Patient has no concerns at this time.  Understands she will follow up with orthopedics regarding her wound and will be discharged to SNF. Able to state name, hospital. Unable to state city (says [place]) or year (says 1966).  Objective: Temp:  [97.6 F (36.4 C)-98.5 F (36.9 C)] 98.5 F (36.9 C) (09/04 0835) Pulse Rate:  [51-67] 60 (09/04 0835) Resp:  [18-20] 20 (09/04 0835) BP: (155-188)/(40-68) 155/59 (09/04 0835) SpO2:  [98 %-99 %] 98 % (09/04 0835) Weight:  [234 lb 5.6 oz (106.3 kg)] 234 lb 5.6 oz (106.3 kg) (09/03 2127)   Net -773ml yesterday.  Physical Exam: General: NAD, intermittently sleepy, answers questions Cardiovascular: RRR, no m/r/g Respiratory: CTA bil, no W/R/R Abdomen: soft, nontender, nondistended Ext: 1+ pitting edema B/l to shin Neuro: able to state full name, place, unable to state city or year. Psych: mood and affect nl   Laboratory:  Recent Labs Lab 04/02/17 0405 04/03/17 0318 04/04/17 0524  WBC 11.8* 10.7* 9.0  HGB 10.4* 9.5* 9.9*  HCT 32.0* 29.7* 30.9*  PLT 215 228 248    Recent Labs Lab 04/03/17 0318 04/04/17 0524 04/06/17 1109  NA 141 142 141  K 3.7 4.2 4.5  CL 113* 112* 110  CO2 23 23 25   BUN 62* 57* 49*  CREATININE 2.29* 1.83* 1.97*  CALCIUM 8.6* 8.8* 9.0  GLUCOSE 105* 108* 137*    Imaging/Diagnostic Tests: Ct Abdomen Pelvis Wo Contrast: 03/29/2017 IMPRESSION: 1. No acute abdominopelvic findings identified. 2. Stable large ventral hernia without evidence of incarceration or obstruction. 3. Interval revision of lumbar fusion. There is an open wound containing apparent packing material. 4. Diffuse hepatic steatosis. 5.   Aortic Atherosclerosis (ICD10-I70.0).   Ct Head Wo Contrast 03/29/2017 FINDINGS: Brain: No subdural, epidural, or subarachnoid hemorrhage identified on today's study. Cerebellum, brainstem, and basal cisterns are within normal limits. No mass effect or midline shift. Ventricles and sulci are unremarkable. No acute cortical ischemia or infarct. Vascular: Calcified atherosclerosis is seen in the intracranial carotid arteries. Skull: Normal. Negative for fracture or focal lesion. Sinuses/Orbits: There is a small amount of fluid or mucus in the sphenoid sinuses. This is similar compared to the previous study. Paranasal sinuses, mastoid air cells, and middle ears are otherwise normal. Other: There is a stable subcutaneous nodule in the posterior scalp on series 3, image 9 of no acute significance. Extracranial soft tissues are otherwise normal.  IMPRESSION:  1. No acute intracranial abnormality identified.  2. Mild sinus disease as above.   Mr Brain Wo Contrast 03/30/2017 FINDINGS: Brain: The midline structures are normal. There is no focal diffusion restriction to indicate acute infarct. There is mild multifocal leukoaraiosis, most commonly seen in the setting of chronic hypertensive microangiopathy. No intraparenchymal hematoma or chronic microhemorrhage. Brain volume is normal for age without lobar predominant atrophy. The dura is normal and there is no extra-axial collection. Vascular: Major  intracranial arterial and venous sinus flow voids are preserved. Skull and upper cervical spine: The visualized skull base, calvarium, upper cervical spine and extracranial soft tissues are normal. Sinuses/Orbits: No fluid levels or advanced mucosal thickening. No mastoid or middle ear effusion. Normal orbits.  IMPRESSION: Mild sequelae of chronic hypertensive microangiopathy. No acute abnormality.   Dg Chest Port 1 View 03/29/2017 FINDINGS: Portable AP semi upright view at 2129 hours. Stable lung volumes. Mild  cardiomegaly. Other mediastinal contours are within normal limits. Allowing for portable technique the lungs are clear. No pneumothorax or pneumoperitoneum. Visible epigastric bowel gas pattern within normal limits.  IMPRESSION: No acute cardiopulmonary abnormality.    Taylor Percy, DO 04/07/2017, 9:20 AM PGY-1, [place] Intern pager: [phone], text pages welcome

## 2017-04-07 NOTE — Progress Notes (Signed)
PT was transferring the patient to recliner, resulting assisted fall assist to eased to floor, lifted up with the help of maximove to chair, pt without injury.  Notified Dr. Nori Riis, no new orders given.  Notified Son [name], verbalized understanding, no questions raised and appreciated for calling him.  Will continue to monitor.

## 2017-04-07 NOTE — Progress Notes (Addendum)
Occupational Therapy Treatment Patient Details Name: Taylor Tyler MRN: [id number] DOB: [birth year] Today's Date: 04/07/2017    History of present illness Taylor Tyler is a 76 y.o. female presenting with AMS, diarrhea and vomiting, also s/p exploration of lumbar wound and application of wound VAC in OR on 8/27. PMH is significant for T2DM, CKD 3, HTN, HLD, chronic anemia, hypothyroidism, HLD, and spinal stenosis s/p neurosurgery operations in 01/2017 and 02/2017    OT comments  Pt much more alert and able to participate vs last visit. Continues to have significant cognitive deficits and to require 2 person assist for all mobility and to adhere to back precautions. Pt with more back pain this visit and increased weakness. Successful transfer to [facility]. Pt with poor tolerance of standing requiring multiple standing trials to complete pericare. Pt also with premature sitting as she transferred to recliner, resulting in assist to ease to floor and lift with maximove to chair, pt without injury. Nursing staff to use lift equipment with pt. Continue to be appropriate for SNF level rehab. Will continue to follow.  Follow Up Recommendations  SNF;Supervision/Assistance - 24 hour    Equipment Recommendations       Recommendations for Other Services      Precautions / Restrictions Precautions Precautions: Fall;Back;Other (comment) (wound vac) Precaution Comments: pt unable to recall back precautions, educated Required Braces or Orthoses: Spinal Brace Spinal Brace: Applied in sitting position;Lumbar corset Spinal Brace Comments: back brace now available       Mobility Bed Mobility Overal bed mobility: Needs Assistance Bed Mobility: Rolling;Sidelying to Sit Rolling: Max assist;+2 for physical assistance Sidelying to sit: +2 for physical assistance;Max assist       General bed mobility comments: multimodal cues for log roll technique, assist for all aspects  Transfers Overall transfer level: Needs  assistance Equipment used: Rolling walker (2 wheeled) Transfers: Sit to/from [facility] Sit to Stand: +2 physical assistance;Min assist;Mod assist Stand pivot transfers: +2 physical assistance;Mod assist;Max assist       General transfer comment: Pt requiring multiple verbal and tactile cues for hand placement, assist required to stand increased as pt fatigued and was dependent on whether she pushed up from the sitting surface, steadying and assist to move walker required, pt with premature sitting upon transferring from [facility] to recliner resulting in need to ease pt to floor and use of maximove to lift pt to chair, RN notified, pt without injury.    Balance Overall balance assessment: Needs assistance   Sitting balance-Leahy Scale: Fair Sitting balance - Comments: min guard assist     Standing balance-Leahy Scale: Poor Standing balance comment: stood x 4 total for transfers and pericare, poor tolerance and flexed posture resulting in pt returning to sit on the BSC x 2 before pericare completed and pt sitting prematurely with pivot to recliner                           ADL either performed or assessed with clinical judgement   ADL Overall ADL's : Needs assistance/impaired                 Upper Body Dressing : Total assistance;Sitting Upper Body Dressing Details (indicate cue type and reason): back brace     Toilet Transfer: +2 for physical assistance;Moderate assistance;Stand-pivot;RW;BSC   Toileting- Clothing Manipulation and Hygiene: Total assistance;+2 for physical assistance;Bed level       Functional mobility during ADLs:  (pt unable  to ambulate) General ADL Comments: Pt much more alert and talkative vs last visit. Significant cognitive impairment remains.     Vision       Perception     Praxis      Cognition Arousal/Alertness: Awake/alert Behavior During Therapy: WFL for tasks assessed/performed Overall Cognitive Status:  Impaired/Different from baseline Area of Impairment: Orientation;Safety/judgement;Following commands;Problem solving;Memory                 Orientation Level: Disoriented to;Place;Time   Memory: Decreased recall of precautions;Decreased short-term memory (poor immediate memory) Following Commands: Follows one step commands with increased time (and multimodal cues) Safety/Judgement: Decreased awareness of safety;Decreased awareness of deficits   Problem Solving: Slow processing;Decreased initiation;Difficulty sequencing;Requires verbal cues;Requires tactile cues          Exercises     Shoulder Instructions       General Comments      Pertinent Vitals/ Pain       Pain Assessment: Faces Faces Pain Scale: Hurts whole lot Pain Location: back Pain Descriptors / Indicators: Discomfort;Grimacing;Moaning Pain Intervention(s): Monitored during session;Premedicated before session;Repositioned  Home Living                                          Prior Functioning/Environment              Frequency  Min 2X/week        Progress Toward Goals  OT Goals(current goals can now be found in the care plan section)  Progress towards OT goals: Progressing toward goals  Acute Rehab OT Goals Patient Stated Goal: to eventually return home OT Goal Formulation: With patient Time For Goal Achievement: 04/14/17 Potential to Achieve Goals: Good  Plan Discharge plan remains appropriate    Co-evaluation    PT/OT/SLP Co-Evaluation/Treatment: Yes Reason for Co-Treatment: For patient/therapist safety;Necessary to address cognition/behavior during functional activity   OT goals addressed during session: ADL's and self-care      AM-PAC PT "6 Clicks" Daily Activity     Outcome Measure   Help from another person eating meals?: A Little Help from another person taking care of personal grooming?: A Little Help from another person toileting, which includes using  toliet, bedpan, or urinal?: Total Help from another person bathing (including washing, rinsing, drying)?: A Lot Help from another person to put on and taking off regular upper body clothing?: A Lot Help from another person to put on and taking off regular lower body clothing?: Total 6 Click Score: 12    End of Session Equipment Utilized During Treatment: Gait belt;Rolling walker;Back brace  OT Visit Diagnosis: Other abnormalities of gait and mobility (R26.89);Unsteadiness on feet (R26.81);Pain;Other symptoms and signs involving cognitive function;Muscle weakness (generalized) (M62.81)   Activity Tolerance Patient limited by fatigue;Patient limited by pain   Patient Left in chair;with call bell/phone within reach;with chair alarm set   Nurse Communication Need for lift equipment;Mobility status;Other (comment) (aware of near fall)        Time: 0911-0949 OT Time Calculation (min): 38 min  Charges: OT General Charges $OT Visit: 1 Visit OT Treatments $Self Care/Home Management : 8-22 mins  04/07/2017 Nestor Lewandowsky, OTR/L Pager: [phone]   Werner Lean, Haze Boyden 04/07/2017, 10:22 AM

## 2017-04-07 NOTE — Progress Notes (Signed)
Physical Therapy Treatment Patient Details Name: Taylor Tyler MRN: [id number] DOB: 04-08-1941 Today's Date: 04/07/2017    History of Present Illness Taylor Tyler is a 76 y.o. female presenting with AMS, diarrhea and vomiting, also s/p exploration of lumbar wound and application of wound VAC in OR on 8/27. PMH is significant for T2DM, CKD 3, HTN, HLD, chronic anemia, hypothyroidism, HLD, and spinal stenosis s/p neurosurgery operations in 01/2017 and 02/2017     PT Comments    Continuing work on functional mobility and activity tolerance;  Noting much more alert and able to participate than last PT sessions; Still requiring 2 person assist for safety due to weakness and cognitive deficits; Successful transfer to [facility];  Pt with poor tolerance of standing requiring multiple standing trials to complete pericare. Pt also with premature sitting as she transferred to recliner, resulting in assist to ease to floor and lift with maximove to chair, pt without injury; RN notified; she called MD attending and family; will continue to follow.  Follow Up Recommendations  SNF     Equipment Recommendations  Rolling walker with 5" wheels;3in1 (PT)    Recommendations for Other Services       Precautions / Restrictions Precautions Precautions: Fall;Back;Other (comment) (wound vac) Precaution Comments: pt unable to recall back precautions, educated Required Braces or Orthoses: Spinal Brace Spinal Brace: Applied in sitting position;Lumbar corset Spinal Brace Comments: back brace now available    Mobility  Bed Mobility Overal bed mobility: Needs Assistance Bed Mobility: Rolling;Sidelying to Sit Rolling: Max assist;+2 for physical assistance Sidelying to sit: +2 for physical assistance;Max assist       General bed mobility comments: multimodal cues for log roll technique, assist for all aspects  Transfers Overall transfer level: Needs assistance Equipment used: Rolling walker (2 wheeled) Transfers:  Sit to/from [facility] Sit to Stand: +2 physical assistance;Min assist;Mod assist Stand pivot transfers: +2 physical assistance;Mod assist;Max assist       General transfer comment: Pt requiring multiple verbal and tactile cues for hand placement, assist required to stand increased as pt fatigued and was dependent on whether she pushed up from the sitting surface, steadying and assist to move walker required, pt with premature sitting upon transferring from [facility] to recliner resulting in need to ease pt to floor and use of maximove to lift pt to chair,  RN notified, pt without injury  Ambulation/Gait                 Stairs            Wheelchair Mobility    Modified Rankin (Stroke Patients Only)       Balance Overall balance assessment: Needs assistance   Sitting balance-Leahy Scale: Fair Sitting balance - Comments: min guard assist     Standing balance-Leahy Scale: Poor Standing balance comment: stood x 4 total for transfers and pericare, poor tolerance and flexed posture resulting in pt returning to sit on the BSC x 2 before pericare completed and pt sitting prematurely with pivot to recliner                            Cognition Arousal/Alertness: Awake/alert Behavior During Therapy: WFL for tasks assessed/performed Overall Cognitive Status: Impaired/Different from baseline Area of Impairment: Orientation;Safety/judgement;Following commands;Problem solving;Memory                 Orientation Level: Disoriented to;Place;Time Current Attention Level: Sustained Memory: Decreased recall of precautions;Decreased short-term  memory (poor immediate memory) Following Commands: Follows one step commands with increased time (and multimodal cues) Safety/Judgement: Decreased awareness of safety;Decreased awareness of deficits   Problem Solving: Slow processing;Decreased initiation;Difficulty sequencing;Requires verbal cues;Requires tactile  cues        Exercises      General Comments        Pertinent Vitals/Pain Pain Assessment: Faces Faces Pain Scale: Hurts whole lot Pain Location: back Pain Descriptors / Indicators: Discomfort;Grimacing;Moaning Pain Intervention(s): Monitored during session;Repositioned    Home Living                      Prior Function            PT Goals (current goals can now be found in the care plan section) Acute Rehab PT Goals Patient Stated Goal: to eventually return home PT Goal Formulation: With patient Time For Goal Achievement: 04/14/17 Potential to Achieve Goals: Fair Progress towards PT goals: Progressing toward goals    Frequency    Min 3X/week      PT Plan Current plan remains appropriate    Co-evaluation PT/OT/SLP Co-Evaluation/Treatment: Yes Reason for Co-Treatment: For patient/therapist safety;Necessary to address cognition/behavior during functional activity PT goals addressed during session: Mobility/safety with mobility OT goals addressed during session: ADL's and self-care      AM-PAC PT "6 Clicks" Daily Activity  Outcome Measure  Difficulty turning over in bed (including adjusting bedclothes, sheets and blankets)?: Unable Difficulty moving from lying on back to sitting on the side of the bed? : Unable Difficulty sitting down on and standing up from a chair with arms (e.g., wheelchair, bedside commode, etc,.)?: Unable Help needed moving to and from a bed to chair (including a wheelchair)?: A Lot Help needed walking in hospital room?: A Lot Help needed climbing 3-5 steps with a railing? : Total 6 Click Score: 8    End of Session Equipment Utilized During Treatment: Gait belt;Back brace Activity Tolerance: Patient tolerated treatment well;Patient limited by fatigue Patient left: in chair;with call bell/phone within reach;with chair alarm set Nurse Communication: Mobility status PT Visit Diagnosis: Other abnormalities of gait and mobility  (R26.89);Pain Pain - Right/Left: Left Pain - part of body: Hip     Time: 0911-0949 PT Time Calculation (min) (ACUTE ONLY): 38 min  Charges:  $Therapeutic Activity: 8-22 mins                    G Codes:       Roney Marion, PT  Acute Rehabilitation Services Pager [phone] Office [place] 04/07/2017, 11:52 AM

## 2017-04-08 ENCOUNTER — Non-Acute Institutional Stay (SKILLED_NURSING_FACILITY): Payer: Medicare Other | Admitting: Internal Medicine

## 2017-04-08 ENCOUNTER — Encounter: Payer: Self-pay | Admitting: Internal Medicine

## 2017-04-08 DIAGNOSIS — D62 Acute posthemorrhagic anemia: Secondary | ICD-10-CM

## 2017-04-08 DIAGNOSIS — G9340 Encephalopathy, unspecified: Secondary | ICD-10-CM | POA: Diagnosis not present

## 2017-04-08 DIAGNOSIS — E784 Other hyperlipidemia: Secondary | ICD-10-CM | POA: Diagnosis not present

## 2017-04-08 DIAGNOSIS — E7849 Other hyperlipidemia: Secondary | ICD-10-CM

## 2017-04-08 DIAGNOSIS — N183 Chronic kidney disease, stage 3 unspecified: Secondary | ICD-10-CM

## 2017-04-08 DIAGNOSIS — I10 Essential (primary) hypertension: Secondary | ICD-10-CM

## 2017-04-08 DIAGNOSIS — Z9889 Other specified postprocedural states: Secondary | ICD-10-CM

## 2017-04-08 DIAGNOSIS — IMO0001 Reserved for inherently not codable concepts without codable children: Secondary | ICD-10-CM

## 2017-04-08 DIAGNOSIS — N179 Acute kidney failure, unspecified: Secondary | ICD-10-CM

## 2017-04-08 DIAGNOSIS — T8130XA Disruption of wound, unspecified, initial encounter: Secondary | ICD-10-CM

## 2017-04-08 DIAGNOSIS — E034 Atrophy of thyroid (acquired): Secondary | ICD-10-CM

## 2017-04-08 DIAGNOSIS — R339 Retention of urine, unspecified: Secondary | ICD-10-CM | POA: Diagnosis not present

## 2017-04-08 DIAGNOSIS — T814XXD Infection following a procedure, subsequent encounter: Secondary | ICD-10-CM

## 2017-04-08 DIAGNOSIS — E1122 Type 2 diabetes mellitus with diabetic chronic kidney disease: Secondary | ICD-10-CM | POA: Diagnosis not present

## 2017-04-08 NOTE — Progress Notes (Signed)
: Provider:  Noah Delaine. Sheppard Coil, MD Location:  [facility] Room Number: [place]:  SNF ([phone])  PCP: Lenard Simmer, MD Patient Care Team: Lenard Simmer, MD as PCP - General (Radiology) Ditty, Kevan Ny, MD as Consulting Physician (Neurosurgery)  Extended Emergency Contact Information Primary Emergency Contact: [place] of [place] Phone: [phone] Relation: Son Secondary Emergency Contact: Joelyn Oms Address: [id number]          [place], [place] [address] [place] Phone: [phone] Relation: Son     Allergies: Clams [shellfish allergy]; Hydralazine; and Milk-related compounds  Chief Complaint  Patient presents with  . Readmit To SNF    following hospitalization 03/29/17 to 04/08/15 altered mental status     HPI: Patient is 76 y.o. female with hypertension, chronic lower extremity edema diabetes mellitus type 2, hypothyroidism, hyperlipidemia, status post lumbar back surgery with dehiscence scheduled to go to surgery tomorrow and was admitted to [facility] from 8/26-9/34 altered mental status. On admission labs were significant for BUN/creatinine 87/2.87, elevated BNP of 492, CT abdomen pelvis negative CT/MRI head negative blood/urine cultures negative. Patient was started on broad-spectrum antibiotics and went to lumbar with an expiration and debridement on day 2 of hospitalization. An approximately narrowed down 1 patient's wound grew out Proteus mirabilis. Hospital stay was complicated by mental status which waxed and waned throughout the hospital stay and also by hypertension, acute kidney injury and persistent bradycardia. Gradually his problems improved and patient is admitted to skilled nursing facility for wound care and for OT/PT. While at skilled nursing facility patient will be followed for hyperlipidemia treated with Lipitor, diabetes mellitus treated with  diet and hypothyroidism treated with Synthroid.  Past Medical History:  Diagnosis Date  . Acute blood loss anemia 03/14/2017  . Acute renal failure superimposed on stage 3 chronic kidney disease ([place]) 03/14/2017  . AKI (acute kidney injury) ([place]) 02/28/2017  . Altered mental status 03/29/2017  . Anemia   . Arthritis   . Bilateral lower extremity edema 03/04/2017  . Chronic kidney disease    ?? renal insufficiency,   . CKD (chronic kidney disease) stage 3, GFR 30-59 ml/min 01/15/2017   ?? renal insufficiency, which she thinks is coming from "all these medications"  . Diabetes mellitus without complication ([place])    diagnosed 4-5 yrs ago  . Disease of pancreas   . Diverticulosis 01/15/2017  . DJD (degenerative joint disease)   . DM (diabetes mellitus), type 2 with renal complications ([place]) 3/97/6734  . Hypertension   . Hypothyroidism   . Lethargy 02/28/2017  . Obesity, Class III, BMI 40-49.9 (morbid obesity) ([place]) 02/27/2017  . Postoperative wound infection 04/02/2017  . Spinal stenosis of lumbar region 01/15/2017  . Status post lumbar surgery 02/24/2017  . Wound healing, delayed    back    Past Surgical History:  Procedure Laterality Date  . ABDOMINAL EXPOSURE N/A 02/24/2017   Procedure: ABDOMINAL EXPOSURE;  Surgeon: Angelia Mould, MD;  Location: [facility];  Service: Vascular;  Laterality: N/A;  . ABDOMINAL HYSTERECTOMY  1987  . ANTERIOR LAT LUMBAR FUSION N/A 02/24/2017   Procedure: Lumbar three- five Anterior lateral lumbar interbody fusion;  Surgeon: Ditty, Kevan Ny, MD;  Location: [facility];  Service: Neurosurgery;  Laterality: N/A;  L3-5 Anterior lateral lumbar interbody fusion with removal of coflex at L3-4, L4-5  . ANTERIOR LUMBAR FUSION N/A 02/24/2017   Procedure: Stage 1: Lumbar  five-Sacral one Anterior lumbar interbody fusion;  Surgeon: Ditty, Kevan Ny, MD;  Location: [facility];  Service: Neurosurgery;  Laterality: N/A;  Stage 1: L5-S1 Anterior lumbar interbody fusion  .  APPLICATION OF ROBOTIC ASSISTANCE FOR SPINAL PROCEDURE N/A 02/26/2017   Procedure: APPLICATION OF ROBOTIC ASSISTANCE FOR SPINAL PROCEDURE;  Surgeon: Ditty, Kevan Ny, MD;  Location: [facility];  Service: Neurosurgery;  Laterality: N/A;  . APPLICATION OF WOUND VAC N/A 03/30/2017   Procedure: APPLICATION OF WOUND VAC;  Surgeon: Ditty, Kevan Ny, MD;  Location: [facility];  Service: Neurosurgery;  Laterality: N/A;  . BACK SURGERY  2013  . BREAST SURGERY  1992   Breast Reduction  . CHOLECYSTECTOMY  1982  . COLON SURGERY  2015  . COLON SURGERY    . EYE SURGERY Right 2017   cataracts  . [place]  . LUMBAR FUSION  02/24/2017  . LUMBAR LAMINECTOMY WITH SPINOUS PROCESS PLATE 2 LEVEL N/A 9/38/1017   Procedure: LUMBAR LAMINECTOMY/DECOMPRESSION MICRODISCECTOMY CoFlex;  Surgeon: Faythe Ghee, MD;  Location: [facility];  Service: Neurosurgery;  Laterality: N/A;  Lumbar three-four,Lumbar Four-Five Laminectomy with Coflex  . LUMBAR WOUND DEBRIDEMENT N/A 03/30/2017   Procedure: Lumbar wound exploration/debridement, placement of wound vac;  Surgeon: Ditty, Kevan Ny, MD;  Location: [facility];  Service: Neurosurgery;  Laterality: N/A;  Lumbar wound exploration/debridement, placement of wound vac    Allergies as of 04/08/2017      Reactions   Clams [shellfish Allergy] Swelling, Other (See Comments)   THROAT SWELLS NECK TURNS RED   Hydralazine Other (See Comments)   CHEST TIGHTNESS   Milk-related Compounds Diarrhea      Medication List       Accurate as of 04/08/17 10:19 AM. Always use your most recent med list.          acetaminophen 500 MG tablet Commonly known as:  TYLENOL Take 2 tablets (1,000 mg total) by mouth 3 (three) times daily.   amLODipine 5 MG tablet Commonly known as:  NORVASC Take 1 tablet (5 mg total) by mouth daily.   atorvastatin 40 MG tablet Commonly known as:  LIPITOR Take 40 mg by mouth every evening.   Baclofen 5 MG Tabs Take 5 mg by mouth 2 (two)  times daily as needed for muscle spasms.   cloNIDine 0.3 MG tablet Commonly known as:  CATAPRES Take 1 tablet (0.3 mg total) by mouth 2 (two) times daily.   ergocalciferol 50000 units capsule Commonly known as:  VITAMIN D2 Take 50,000 Units by mouth once a week. On Tuesdays   ferrous sulfate 325 (65 FE) MG tablet Take 325 mg by mouth daily with breakfast.   hydrochlorothiazide 25 MG tablet Commonly known as:  HYDRODIURIL Take 25 mg by mouth daily.   levofloxacin 250 MG tablet Commonly known as:  LEVAQUIN Take 1 tablet (250 mg total) by mouth daily.   levothyroxine 88 MCG tablet Commonly known as:  SYNTHROID, LEVOTHROID Take 88 mcg by mouth daily before breakfast.   losartan 100 MG tablet Commonly known as:  COZAAR Take 100 mg by mouth daily after breakfast.   Magnesium Oxide 250 MG Tabs Take 250 mg by mouth daily.   pantoprazole 40 MG tablet Commonly known as:  PROTONIX Take 1 tablet (40 mg total) by mouth at bedtime.   SOOTHE 0.6-0.6 % Soln Generic drug:  Propylene Glycol-Glycerin Place 1 drop into both eyes daily as needed (for dry/gritty eyes.).       No orders of  the defined types were placed in this encounter.   Immunization History  Administered Date(s) Administered  . Influenza-Unspecified 05/26/2012, 04/20/2013    Social History  Substance Use Topics  . Smoking status: Never Smoker  . Smokeless tobacco: Never Used  . Alcohol use No    Family history is   Family History  Problem Relation Age of Onset  . Diabetes Father   . Heart attack Father   . Hypertension Father   . Diabetes Mother   . Hypertension Mother       Review of Systems  DATA OBTAINED: from patient, nurse GENERAL:  no fevers, fatigue, appetite changes SKIN: No itching, or rash EYES: No eye pain, redness, discharge EARS: No earache, tinnitus, change in hearing NOSE: No congestion, drainage or bleeding  MOUTH/THROAT: No mouth or tooth pain, No sore throat RESPIRATORY: No  cough, wheezing, SOB CARDIAC: No chest pain, palpitations, lower extremity edema  GI: No abdominal pain, No N/V/D or constipation, No heartburn or reflux  GU: No dysuria, frequency or urgency, or incontinence  MUSCULOSKELETAL: No unrelieved bone/joint pain NEUROLOGIC: No headache, dizziness or focal weakness PSYCHIATRIC: No c/o anxiety or sadness   Vitals:   04/08/17 1000  BP: (!) 145/62  Pulse: 64  Resp: 20  Temp: 99.1 F (37.3 C)  SpO2: 98%    SpO2 Readings from Last 1 Encounters:  04/08/17 98%   Body mass index is 42.18 kg/m.     Physical Exam  GENERAL APPEARANCE: Alert, conversant,  No acute distress.  SKIN: No diaphoresis rash; back wound dressed and not visualized this visit HEAD: Normocephalic, atraumatic  EYES: Conjunctiva/lids clear. Pupils round, reactive. EOMs intact.  EARS: External exam WNL, canals clear. Hearing grossly normal.  NOSE: No deformity or discharge.  MOUTH/THROAT: Lips w/o lesions  RESPIRATORY: Breathing is even, unlabored. Lung sounds are clear   CARDIOVASCULAR: Heart RRR no murmurs, rubs or gallops. 1+ peripheral edema.   GASTROINTESTINAL: Abdomen is soft, non-tender, not distended w/ normal bowel sounds. GENITOURINARY: Bladder non tender, not distended  MUSCULOSKELETAL: No abnormal joints or musculature NEUROLOGIC:  Cranial nerves 2-12 grossly intact. Moves all extremities  PSYCHIATRIC: Mood and affect appropriate to situation, no behavioral issues  Patient Active Problem List   Diagnosis Date Noted  . Essential hypertension, benign   . Altered consciousness   . Postoperative wound infection 04/02/2017  . Urinary retention   . Pressure injury of skin 03/30/2017  . Wound dehiscence   . Diarrhea   . Altered mental status 03/29/2017  . Anemia 03/14/2017  . Acute blood loss anemia 03/14/2017  . Acute renal failure superimposed on stage 3 chronic kidney disease ([place]) 03/14/2017  . Bilateral lower extremity edema 03/04/2017  . AKI (acute  kidney injury) ([place]) 02/28/2017  . Lethargy 02/28/2017  . Obesity, Class III, BMI 40-49.9 (morbid obesity) ([place]) 02/27/2017  . DM (diabetes mellitus), type 2 with renal complications ([place]) 98/92/1194  . Status post lumbar surgery 02/24/2017  . Hypertensive emergency 01/15/2017  . CKD (chronic kidney disease) stage 3, GFR 30-59 ml/min 01/15/2017  . Hypothyroidism 01/15/2017  . Diverticulosis 01/15/2017  . HLD (hyperlipidemia) 01/15/2017  . Spinal stenosis of lumbar region 01/15/2017  . Disease of pancreas 07/21/2012      Labs reviewed: Basic Metabolic Panel:    Component Value Date/Time   NA 141 04/06/2017 1109   NA 135 (A) 03/09/2017   NA 140 04/22/2014 0210   K 4.5 04/06/2017 1109   K 4.0 04/22/2014 0210   CL 110 04/06/2017  1109   CL 108 (H) 04/22/2014 0210   CO2 25 04/06/2017 1109   CO2 24 04/22/2014 0210   GLUCOSE 137 (H) 04/06/2017 1109   GLUCOSE 78 04/22/2014 0210   BUN 49 (H) 04/06/2017 1109   BUN 45 (A) 03/09/2017   BUN 27 (H) 04/22/2014 0210   CREATININE 1.97 (H) 04/06/2017 1109   CREATININE 1.09 04/22/2014 0210   CALCIUM 9.0 04/06/2017 1109   CALCIUM 8.5 04/22/2014 0210   PROT 5.8 (L) 03/30/2017 1818   PROT 5.4 (L) 01/27/2014 0503   ALBUMIN 2.1 (L) 03/30/2017 1818   ALBUMIN 2.1 (L) 01/27/2014 0503   AST 29 03/30/2017 1818   AST 71 (H) 01/27/2014 0503   ALT 18 03/30/2017 1818   ALT 43 01/27/2014 0503   ALKPHOS 82 03/30/2017 1818   ALKPHOS 41 (L) 01/27/2014 0503   BILITOT 0.6 03/30/2017 1818   BILITOT 0.4 01/27/2014 0503   GFRNONAA 24 (L) 04/06/2017 1109   GFRNONAA 51 (L) 04/22/2014 0210   GFRAA 28 (L) 04/06/2017 1109   GFRAA 59 (L) 04/22/2014 0210     Recent Labs  01/15/17 1530  03/29/17 2217  04/03/17 0318 04/04/17 0524 04/06/17 1109  NA  --   < >  --   < > 141 142 141  K  --   < >  --   < > 3.7 4.2 4.5  CL  --   < >  --   < > 113* 112* 110  CO2  --   < >  --   < > 23 23 25   GLUCOSE  --   < >  --   < > 105* 108* 137*  BUN  --   < >  --   <  > 62* 57* 49*  CREATININE  --   < >  --   < > 2.29* 1.83* 1.97*  CALCIUM  --   < >  --   < > 8.6* 8.8* 9.0  MG 1.6*  --  2.1  --   --   --   --   PHOS 3.4  --  3.1  --   --   --   --   < > = values in this interval not displayed. Liver Function Tests:  Recent Labs  01/16/17 0404 03/29/17 1323 03/30/17 1818  AST 24 28 29   ALT 17 18 18   ALKPHOS 40 90 82  BILITOT 0.6 0.6 0.6  PROT 6.7 6.5 5.8*  ALBUMIN 3.1* 2.4* 2.1*    Recent Labs  03/29/17 1323  LIPASE 72*   No results for input(s): AMMONIA in the last 8760 hours. CBC:  Recent Labs  03/29/17 1323  04/02/17 0405 04/03/17 0318 04/04/17 0524  WBC 6.9  < > 11.8* 10.7* 9.0  NEUTROABS 4.9  --   --   --   --   HGB 7.8*  < > 10.4* 9.5* 9.9*  HCT 24.8*  < > 32.0* 29.7* 30.9*  MCV 85.5  < > 84.4 84.6 84.7  PLT 302  < > 215 228 248  < > = values in this interval not displayed. Lipid No results for input(s): CHOL, HDL, LDLCALC, TRIG in the last 8760 hours.  Cardiac Enzymes:  Recent Labs  07/06/16 1801 07/06/16 2329 07/07/16 0616  TROPONINI <0.03 <0.03 <0.03   BNP:  Recent Labs  03/29/17 2217  BNP 492.3*   No results found for: [facility] Lab Results  Component Value Date   HGBA1C 5.7 (H)  02/16/2017   Lab Results  Component Value Date   TSH 0.984 03/29/2017   No results found for: VITAMINB12 No results found for: [facility] Lab Results  Component Value Date   IRON 12 (L) 12/15/2013   TIBC 171 (L) 12/15/2013   FERRITIN 228 12/15/2013    Imaging and Procedures obtained prior to SNF admission: Ct Abdomen Pelvis Wo Contrast  Result Date: 03/29/2017 CLINICAL DATA:  Nausea, vomiting and weakness for 1 week. Altered mental status. Back surgery 1 month ago. EXAM: CT ABDOMEN AND PELVIS WITHOUT CONTRAST TECHNIQUE: Multidetector CT imaging of the abdomen and pelvis was performed following the standard protocol without IV contrast. COMPARISON:  Abdominopelvic CT 07/07/2016. Intraoperative lumbar radiographs  02/26/2017. FINDINGS: Lower chest: There is stable subpleural lipoma at the right lung base on image 8. The lung bases are otherwise clear. No significant pleural or pericardial effusion. The heart is mildly enlarged. Hepatobiliary: Progressive diffuse hepatic steatosis. No focal abnormalities are seen on noncontrast imaging. No significant biliary dilatation status post cholecystectomy. Pancreas: Unremarkable. No pancreatic ductal dilatation or surrounding inflammatory changes. Spleen: Normal in size without focal abnormality. Adrenals/Urinary Tract: Both adrenal glands appear normal. Stable small nonobstructing calculus in the lower pole of the left kidney. No other urinary tract calculi are demonstrated. There is stable mild renal cortical thinning bilaterally. Chronic perinephric soft tissue stranding has improved. There is no hydronephrosis. The bladder appears unremarkable. Stomach/Bowel: No evidence of bowel wall thickening, distention or surrounding inflammatory change. The distal colonic anastomosis appears patent status post partial colectomy. There are diverticular changes throughout the colon. Broad-based ventral hernia containing transverse colon and multiple loops of small bowel is unchanged. No evidence of incarceration or obstruction. Vascular/Lymphatic: There are no enlarged abdominal or pelvic lymph nodes. Aortic and branch vessel atherosclerosis noted. Reproductive: Hysterectomy. No evidence of adnexal mass. Probable pelvic floor laxity. Other: As above, large broad-based ventral hernia, grossly stable. No ascites or free air. Musculoskeletal: No acute or significant osseous findings. Interval revision of lower lumbar fusion. The patient is now fused from L3 through S1. The hardware appears intact. There is an open midline wound superiorly with apparent packing material and surrounding soft tissue emphysema. There is no large paraspinal fluid collection. No bone destruction identified. IMPRESSION:  1. No acute abdominopelvic findings identified. 2. Stable large ventral hernia without evidence of incarceration or obstruction. 3. Interval revision of lumbar fusion. There is an open wound containing apparent packing material. 4. Diffuse hepatic steatosis. 5.  Aortic Atherosclerosis (ICD10-I70.0). Electronically Signed   By: Richardean Sale M.D.   On: 03/29/2017 16:13   Ct Head Wo Contrast  Result Date: 03/29/2017 CLINICAL DATA:  Nausea, vomiting, and weakness for 1 week. EXAM: CT HEAD WITHOUT CONTRAST TECHNIQUE: Contiguous axial images were obtained from the base of the skull through the vertex without intravenous contrast. COMPARISON:  April 21, 2014 FINDINGS: Brain: No subdural, epidural, or subarachnoid hemorrhage identified on today's study. Cerebellum, brainstem, and basal cisterns are within normal limits. No mass effect or midline shift. Ventricles and sulci are unremarkable. No acute cortical ischemia or infarct. Vascular: Calcified atherosclerosis is seen in the intracranial carotid arteries. Skull: Normal. Negative for fracture or focal lesion. Sinuses/Orbits: There is a small amount of fluid or mucus in the sphenoid sinuses. This is similar compared to the previous study. Paranasal sinuses, mastoid air cells, and middle ears are otherwise normal. Other: There is a stable subcutaneous nodule in the posterior scalp on series 3, image 9 of no acute significance. Extracranial soft  tissues are otherwise normal. IMPRESSION: 1. No acute intracranial abnormality identified. 2. Mild sinus disease as above. Electronically Signed   By: Dorise Bullion III M.D   On: 03/29/2017 16:05   Mr Brain Wo Contrast  Result Date: 03/30/2017 CLINICAL DATA:  Altered mental status EXAM: MRI HEAD WITHOUT CONTRAST TECHNIQUE: Multiplanar, multiecho pulse sequences of the brain and surrounding structures were obtained without intravenous contrast. COMPARISON:  Head CT 03/29/2017 FINDINGS: Brain: The midline structures  are normal. There is no focal diffusion restriction to indicate acute infarct. There is mild multifocal leukoaraiosis, most commonly seen in the setting of chronic hypertensive microangiopathy. No intraparenchymal hematoma or chronic microhemorrhage. Brain volume is normal for age without lobar predominant atrophy. The dura is normal and there is no extra-axial collection. Vascular: Major intracranial arterial and venous sinus flow voids are preserved. Skull and upper cervical spine: The visualized skull base, calvarium, upper cervical spine and extracranial soft tissues are normal. Sinuses/Orbits: No fluid levels or advanced mucosal thickening. No mastoid or middle ear effusion. Normal orbits. IMPRESSION: Mild sequelae of chronic hypertensive microangiopathy. No acute abnormality. Electronically Signed   By: Ulyses Jarred M.D.   On: 03/30/2017 06:14   Dg Chest Port 1 View  Result Date: 03/29/2017 CLINICAL DATA:  76 year old female with altered mental status. Nausea vomiting and weakness for 1 week. EXAM: PORTABLE CHEST 1 VIEW COMPARISON:  Portable chest 02/26/2017 and earlier. FINDINGS: Portable AP semi upright view at 2129 hours. Stable lung volumes. Mild cardiomegaly. Other mediastinal contours are within normal limits. Allowing for portable technique the lungs are clear. No pneumothorax or pneumoperitoneum. Visible epigastric bowel gas pattern within normal limits. IMPRESSION: No acute cardiopulmonary abnormality. Electronically Signed   By: Genevie Ann M.D.   On: 03/29/2017 21:56     Not all labs, radiology exams or other studies done during hospitalization come through on my EPIC note; however they are reviewed by me.    Assessment and Plan  WOUND DEHISCENCE LUMBAR SURGERY-patient underwent wound exploration and debridement; wound culture grew out Proteus mirabilis; wound VAC is placed with follow-up as outpatient in 3 weeks; patient will be on renally dosed Levaquin for 6 weeks-through 10/7 SNF -  admitted for wound care, by mouth antibiotics until 10/7, Levaquin 250 mg daily and for OT PT  ACUTE BLOOD LOSS ANEMIA-hemoglobin on 10/26 was 7.5, discharge hemoglobin 9.9 after transfusion SNF - will follow-up CBC; continue iron 325 mg by mouth daily  HYPERTENSION/AKI high-patient high in fluctuating blood pressure while in the hospital; multiple medications were changed/rearranged with improvement and stabilization of blood pressure; there were some difficulties because ACEs and ARB could not be used because of acute kidney injury and Lasix also could not be used for the same reason SNF - will follow-up BMP; will restart Lasix as needed; plan to continue Norvasc 5 mg daily clonidine 0.3 mg twice a day hydrochlorothiazide 25 mg daily and losartan 100 mg daily  ACUTE ENCEPHALOPATHY-wax and wane throughout the entire hospitalization; all workup were negative; mental status improved once narcotics gabapentin and other sedating medications were stopped; therefore pain control will be Tylenol only 3 times a day SNF - plan to start pain control with Tylenol 3 times a day; will only escalate it is absolutely necessary and if mental status improves  ACUTE URINARY RETENTION-Foley placed secondary to urinary retention patient failed voiding trial; suggest Foley for 2 weeks and repeat voiding trial SNF - will plan and suggested repeat containing Foley for 2 weeks and then then  a voiding trial; if not successful we'll refer to urology  DM 2 SNF -hemoglobin A1c 5.7; Glucophage was DC'd secondary to kidney injury; will treat for diet and add agents if needed  HYPERLIPIDEMIA SNF -plan to continue Lipitor 40 mg by mouth daily  HYPOTHYROIDISM SNF - TSH normal; plan to continue Synthroid 88 g daily   Time spent greater than 45 minutes;> 50% of time with patient was spent reviewing records, labs, tests and studies, counseling and developing plan of care  Webb Silversmith D. Sheppard Coil, MD

## 2017-04-10 LAB — CBC AND DIFFERENTIAL
HCT: 33 — AB (ref 36–46)
Hemoglobin: 10.3 — AB (ref 12.0–16.0)
PLATELETS: 235 (ref 150–399)
WBC: 7.3

## 2017-04-10 LAB — BASIC METABOLIC PANEL
BUN: 27 — AB (ref 4–21)
CREATININE: 1.4 — AB (ref 0.5–1.1)
GLUCOSE: 88
Potassium: 4.5 (ref 3.4–5.3)
Sodium: 140 (ref 137–147)

## 2017-04-11 ENCOUNTER — Encounter: Payer: Self-pay | Admitting: Internal Medicine

## 2017-04-11 DIAGNOSIS — G9340 Encephalopathy, unspecified: Secondary | ICD-10-CM | POA: Insufficient documentation

## 2017-04-23 NOTE — Op Note (Signed)
03/29/2017 - 03/30/2017  8:54 AM  PATIENT:  Taylor Tyler  76 y.o. female  PRE-OPERATIVE DIAGNOSIS:  Lumbar wound dehiscence  POST-OPERATIVE DIAGNOSIS:  Same  PROCEDURE:  Lumbar wound exploration, irrigation and debridement, placement of wound vac  SURGEON:  Aldean Ast, MD  ASSISTANTS: None  ANESTHESIA:   General  DRAINS: Wound Vac   SPECIMEN:  Aspirate  INDICATION FOR PROCEDURE: 76 year old woman with lumbar wound dehiscence and altered mental status.  I recommended the above procedure.  The patient could not provideconsent dueto altered mental status but her son understood the risks, benefits, and alternatives and potential outcomes and wished to proceed.  PROCEDURE DETAILS: After smooth induction of general endotracheal anesthsia the patient was positioned prone on a Wilson frame. The skin of the low back was prepped and draped in the usual sterile fashion.  The skin edges were excised. Suture material wasidentified and removed.  The fascia was opened.  There was some cloudy fluid consistent with a seroma from BMP usage, but no purulence.  I sampled this and sent it as specimen for culture.  I debrided the muscle edges.  I irrigated vigorously using a pulse irrigator with saline and gentamicin.  I then placed vancomycin powder in the subfascial space.  The fascia was then closed with interrupted vicryl sutures.  The wound was further irrigated.  A wound vac sponge was cut to fit the defect.  This was placed within the wound and the sealing film was placed over the wound vac sponge.  Suction was applied and there was a good seal.  The patient was returned to the supine position and awoke without difficulty.  PATIENT DISPOSITION:  PACU - hemodynamically stable.   Delay start of Pharmacological VTE agent (>24hrs) due to surgical blood loss or risk of bleeding:  yes

## 2017-05-04 ENCOUNTER — Encounter: Payer: Self-pay | Admitting: Internal Medicine

## 2017-05-04 ENCOUNTER — Non-Acute Institutional Stay (SKILLED_NURSING_FACILITY): Payer: Medicare Other | Admitting: Internal Medicine

## 2017-05-04 DIAGNOSIS — N183 Chronic kidney disease, stage 3 unspecified: Secondary | ICD-10-CM

## 2017-05-04 DIAGNOSIS — E034 Atrophy of thyroid (acquired): Secondary | ICD-10-CM

## 2017-05-04 DIAGNOSIS — E1122 Type 2 diabetes mellitus with diabetic chronic kidney disease: Secondary | ICD-10-CM

## 2017-05-04 DIAGNOSIS — I10 Essential (primary) hypertension: Secondary | ICD-10-CM | POA: Diagnosis not present

## 2017-05-04 NOTE — Progress Notes (Signed)
Location:  [facility] Room Number: [place]:  SNF [phone])  Provider: Noah Delaine. Sheppard Coil, MD  Lenard Simmer, MD  Patient Care Team: Lenard Simmer, MD as PCP - General (Radiology) Ditty, Kevan Ny, MD as Consulting Physician (Neurosurgery)  Extended Emergency Contact Information Primary Emergency Contact: [place] Phone: [phone] Relation: Son Secondary Emergency Contact: Joelyn Oms Address: [id number]          [place], [place] [address] [place] Phone: [phone] Relation: Son    Allergies: Clams [shellfish allergy]; Hydralazine; and Milk-related compounds  Chief Complaint  Patient presents with  . Medical Management of Chronic Issues    routine visit    HPI: Patient is 76 y.o. female who Is being seen for routine issues of hypertension diabetes type 2 and hypothyroidism.  Past Medical History:  Diagnosis Date  . Acute blood loss anemia 03/14/2017  . Acute renal failure superimposed on stage 3 chronic kidney disease ([place]) 03/14/2017  . AKI (acute kidney injury) ([place]) 02/28/2017  . Altered mental status 03/29/2017  . Anemia   . Arthritis   . Bilateral lower extremity edema 03/04/2017  . Chronic kidney disease    ?? renal insufficiency,   . CKD (chronic kidney disease) stage 3, GFR 30-59 ml/min (HCC) 01/15/2017   ?? renal insufficiency, which she thinks is coming from "all these medications"  . Diabetes mellitus without complication ([place])    diagnosed 4-5 yrs ago  . Disease of pancreas   . Diverticulosis 01/15/2017  . DJD (degenerative joint disease)   . DM (diabetes mellitus), type 2 with renal complications ([place]) 10/17/1759  . Hypertension   . Hypothyroidism   . Lethargy 02/28/2017  . Obesity, Class III, BMI 40-49.9 (morbid obesity) ([place]) 02/27/2017  . Postoperative wound infection 04/02/2017  . Spinal stenosis of lumbar region 01/15/2017  .  Status post lumbar surgery 02/24/2017  . Wound healing, delayed    back    Past Surgical History:  Procedure Laterality Date  . ABDOMINAL EXPOSURE N/A 02/24/2017   Procedure: ABDOMINAL EXPOSURE;  Surgeon: Angelia Mould, MD;  Location: [facility];  Service: Vascular;  Laterality: N/A;  . ABDOMINAL HYSTERECTOMY  1987  . ANTERIOR LAT LUMBAR FUSION N/A 02/24/2017   Procedure: Lumbar three- five Anterior lateral lumbar interbody fusion;  Surgeon: Ditty, Kevan Ny, MD;  Location: [facility];  Service: Neurosurgery;  Laterality: N/A;  L3-5 Anterior lateral lumbar interbody fusion with removal of coflex at L3-4, L4-5  . ANTERIOR LUMBAR FUSION N/A 02/24/2017   Procedure: Stage 1: Lumbar five-Sacral one Anterior lumbar interbody fusion;  Surgeon: Ditty, Kevan Ny, MD;  Location: [facility];  Service: Neurosurgery;  Laterality: N/A;  Stage 1: L5-S1 Anterior lumbar interbody fusion  . APPLICATION OF ROBOTIC ASSISTANCE FOR SPINAL PROCEDURE N/A 02/26/2017   Procedure: APPLICATION OF ROBOTIC ASSISTANCE FOR SPINAL PROCEDURE;  Surgeon: Ditty, Kevan Ny, MD;  Location: [facility];  Service: Neurosurgery;  Laterality: N/A;  . APPLICATION OF WOUND VAC N/A 03/30/2017   Procedure: APPLICATION OF WOUND VAC;  Surgeon: Ditty, Kevan Ny, MD;  Location: [facility];  Service: Neurosurgery;  Laterality: N/A;  . BACK SURGERY  2013  . BREAST SURGERY  1992   Breast Reduction  . CHOLECYSTECTOMY  1982  . COLON SURGERY  2015  . COLON SURGERY    . EYE SURGERY Right 2017   cataracts  . [place]  . LUMBAR  FUSION  02/24/2017  . LUMBAR LAMINECTOMY WITH SPINOUS PROCESS PLATE 2 LEVEL N/A 9/52/8413   Procedure: LUMBAR LAMINECTOMY/DECOMPRESSION MICRODISCECTOMY CoFlex;  Surgeon: Faythe Ghee, MD;  Location: [facility];  Service: Neurosurgery;  Laterality: N/A;  Lumbar three-four,Lumbar Four-Five Laminectomy with Coflex  . LUMBAR WOUND DEBRIDEMENT N/A 03/30/2017   Procedure: Lumbar wound exploration/debridement,  placement of wound vac;  Surgeon: Ditty, Kevan Ny, MD;  Location: [facility];  Service: Neurosurgery;  Laterality: N/A;  Lumbar wound exploration/debridement, placement of wound vac    Allergies as of 05/04/2017      Reactions   Clams [shellfish Allergy] Swelling, Other (See Comments)   THROAT SWELLS NECK TURNS RED   Hydralazine Other (See Comments)   CHEST TIGHTNESS   Milk-related Compounds Diarrhea      Medication List       Accurate as of 05/04/17 11:59 PM. Always use your most recent med list.          acetaminophen 500 MG tablet Commonly known as:  TYLENOL Take 2 tablets (1,000 mg total) by mouth 3 (three) times daily.   amLODipine 5 MG tablet Commonly known as:  NORVASC Take 1 tablet (5 mg total) by mouth daily.   atorvastatin 40 MG tablet Commonly known as:  LIPITOR Take 40 mg by mouth every evening.   Baclofen 5 MG Tabs Take 5 mg by mouth 2 (two) times daily as needed for muscle spasms.   cloNIDine 0.3 MG tablet Commonly known as:  CATAPRES Take 0.3 mg by mouth. Take one tablet three times daily   ergocalciferol 50000 units capsule Commonly known as:  VITAMIN D2 Take 50,000 Units by mouth once a week. On Tuesdays   ferrous sulfate 325 (65 FE) MG tablet Take 325 mg by mouth daily with breakfast.   hydrochlorothiazide 25 MG tablet Commonly known as:  HYDRODIURIL Take 25 mg by mouth daily.   levofloxacin 250 MG tablet Commonly known as:  LEVAQUIN Take 1 tablet (250 mg total) by mouth daily.   levothyroxine 88 MCG tablet Commonly known as:  SYNTHROID, LEVOTHROID Take 88 mcg by mouth daily before breakfast.   losartan 100 MG tablet Commonly known as:  COZAAR Take 100 mg by mouth daily after breakfast.   Magnesium Oxide 250 MG Tabs Take 250 mg by mouth daily.   pantoprazole 40 MG tablet Commonly known as:  PROTONIX Take 1 tablet (40 mg total) by mouth at bedtime.   PROSTATE PO Take by mouth. 30 ml twice daily to aid in wound healing   SOOTHE  0.6-0.6 % Soln Generic drug:  Propylene Glycol-Glycerin Place 1 drop into both eyes daily as needed (for dry/gritty eyes.).       Meds ordered this encounter  Medications  . Specialty Vitamins Products (PROSTATE PO)    Sig: Take by mouth. 30 ml twice daily to aid in wound healing  . cloNIDine (CATAPRES) 0.3 MG tablet    Sig: Take 0.3 mg by mouth. Take one tablet three times daily    Immunization History  Administered Date(s) Administered  . Influenza-Unspecified 05/26/2012, 04/20/2013    Social History  Substance Use Topics  . Smoking status: Never Smoker  . Smokeless tobacco: Never Used  . Alcohol use No    Review of Systems  DATA OBTAINED: from patient, nurse GENERAL:  no fevers, fatigue, appetite changes SKIN: No itching, rash HEENT: No complaint RESPIRATORY: No cough, wheezing, SOB CARDIAC: No chest pain, palpitations, lower extremity edema  GI: No abdominal pain, No N/V/D  or constipation, No heartburn or reflux  GU: No dysuria, frequency or urgency, or incontinence  MUSCULOSKELETAL: No unrelieved bone/joint pain NEUROLOGIC: No headache, dizziness  PSYCHIATRIC: No overt anxiety or sadness  Vitals:   05/04/17 1518  BP: 132/67  Pulse: 67  Resp: 20  Temp: 98.3 F (36.8 C)   Body mass index is 38.43 kg/m. Physical Exam  GENERAL APPEARANCE: Alert, conversant, No acute distress  SKIN: No diaphoresis rash HEENT: Unremarkable RESPIRATORY: Breathing is even, unlabored. Lung sounds are clear   CARDIOVASCULAR: Heart RRR no murmurs, rubs or gallops. No peripheral edema  GASTROINTESTINAL: Abdomen is soft, non-tender, not distended w/ normal bowel sounds.  GENITOURINARY: Bladder non tender, not distended  MUSCULOSKELETAL: No abnormal joints or musculature NEUROLOGIC: Cranial nerves 2-12 grossly intact. Moves all extremities PSYCHIATRIC: Mood and affect appropriate to situation, no behavioral issues  Patient Active Problem List   Diagnosis Date Noted  . Acute  encephalopathy 04/11/2017  . Essential hypertension, benign   . Altered consciousness   . Postoperative wound infection 04/02/2017  . Urinary retention   . Pressure injury of skin 03/30/2017  . Wound dehiscence   . Diarrhea   . Altered mental status 03/29/2017  . Anemia 03/14/2017  . Acute blood loss anemia 03/14/2017  . Acute renal failure superimposed on stage 3 chronic kidney disease ([place]) 03/14/2017  . Bilateral lower extremity edema 03/04/2017  . AKI (acute kidney injury) ([place]) 02/28/2017  . Lethargy 02/28/2017  . Obesity, Class III, BMI 40-49.9 (morbid obesity) ([place]) 02/27/2017  . DM (diabetes mellitus), type 2 with renal complications ([place]) 93/81/8299  . Status post lumbar surgery 02/24/2017  . Hypertensive emergency 01/15/2017  . CKD (chronic kidney disease) stage 3, GFR 30-59 ml/min (HCC) 01/15/2017  . Hypothyroidism 01/15/2017  . Diverticulosis 01/15/2017  . HLD (hyperlipidemia) 01/15/2017  . Spinal stenosis of lumbar region 01/15/2017  . Disease of pancreas 07/21/2012    CMP     Component Value Date/Time   NA 140 04/10/2017   NA 140 04/22/2014 0210   K 4.5 04/10/2017   K 4.0 04/22/2014 0210   CL 110 04/06/2017 1109   CL 108 (H) 04/22/2014 0210   CO2 25 04/06/2017 1109   CO2 24 04/22/2014 0210   GLUCOSE 137 (H) 04/06/2017 1109   GLUCOSE 78 04/22/2014 0210   BUN 27 (A) 04/10/2017   BUN 27 (H) 04/22/2014 0210   CREATININE 1.4 (A) 04/10/2017   CREATININE 1.97 (H) 04/06/2017 1109   CREATININE 1.09 04/22/2014 0210   CALCIUM 9.0 04/06/2017 1109   CALCIUM 8.5 04/22/2014 0210   PROT 5.8 (L) 03/30/2017 1818   PROT 5.4 (L) 01/27/2014 0503   ALBUMIN 2.1 (L) 03/30/2017 1818   ALBUMIN 2.1 (L) 01/27/2014 0503   AST 29 03/30/2017 1818   AST 71 (H) 01/27/2014 0503   ALT 18 03/30/2017 1818   ALT 43 01/27/2014 0503   ALKPHOS 82 03/30/2017 1818   ALKPHOS 41 (L) 01/27/2014 0503   BILITOT 0.6 03/30/2017 1818   BILITOT 0.4 01/27/2014 0503   GFRNONAA 24 (L) 04/06/2017  1109   GFRNONAA 51 (L) 04/22/2014 0210   GFRAA 28 (L) 04/06/2017 1109   GFRAA 59 (L) 04/22/2014 0210    Recent Labs  01/15/17 1530  03/29/17 2217  04/03/17 0318 04/04/17 0524 04/06/17 1109 04/10/17  NA  --   < >  --   < > 141 142 141 140  K  --   < >  --   < > 3.7 4.2 4.5  4.5  CL  --   < >  --   < > 113* 112* 110  --   CO2  --   < >  --   < > 23 23 25   --   GLUCOSE  --   < >  --   < > 105* 108* 137*  --   BUN  --   < >  --   < > 62* 57* 49* 27*  CREATININE  --   < >  --   < > 2.29* 1.83* 1.97* 1.4*  CALCIUM  --   < >  --   < > 8.6* 8.8* 9.0  --   MG 1.6*  --  2.1  --   --   --   --   --   PHOS 3.4  --  3.1  --   --   --   --   --   < > = values in this interval not displayed.  Recent Labs  01/16/17 0404 03/29/17 1323 03/30/17 1818  AST 24 28 29   ALT 17 18 18   ALKPHOS 40 90 82  BILITOT 0.6 0.6 0.6  PROT 6.7 6.5 5.8*  ALBUMIN 3.1* 2.4* 2.1*    Recent Labs  03/29/17 1323  04/02/17 0405 04/03/17 0318 04/04/17 0524 04/10/17  WBC 6.9  < > 11.8* 10.7* 9.0 7.3  NEUTROABS 4.9  --   --   --   --   --   HGB 7.8*  < > 10.4* 9.5* 9.9* 10.3*  HCT 24.8*  < > 32.0* 29.7* 30.9* 33*  MCV 85.5  < > 84.4 84.6 84.7  --   PLT 302  < > 215 228 248 235  < > = values in this interval not displayed. No results for input(s): CHOL, LDLCALC, TRIG in the last 8760 hours.  Invalid input(s): HCL No results found for: MICROALBUR Lab Results  Component Value Date   TSH 0.984 03/29/2017   Lab Results  Component Value Date   HGBA1C 5.7 (H) 02/16/2017   No results found for: CHOL, HDL, LDLCALC, LDLDIRECT, TRIG, CHOLHDL  Significant Diagnostic Results in last 30 days:  No results found.  Assessment and Plan  Essential hypertension, benign Controlled on multiple meds; plan to continue clonidine 0.3 mg 3 times a day, Cozaar 100 mg by mouth daily, hydrochlorothiazide 25 mg by mouth daily and Norvasc 5 mg by mouth daily  DM (diabetes mellitus), type 2 with renal complications  (HCC) X4D 5.7 on no medications; patient is on ARB and statins  Hypothyroidism TSH 0.986 with normal T4; plan to continue Synthroid 88 g daily     Jonella Redditt D. Sheppard Coil, MD

## 2017-05-07 ENCOUNTER — Ambulatory Visit (INDEPENDENT_AMBULATORY_CARE_PROVIDER_SITE_OTHER): Payer: Medicare Other | Admitting: Internal Medicine

## 2017-05-07 ENCOUNTER — Encounter: Payer: Self-pay | Admitting: Internal Medicine

## 2017-05-07 DIAGNOSIS — T8149XA Infection following a procedure, other surgical site, initial encounter: Secondary | ICD-10-CM | POA: Diagnosis not present

## 2017-05-07 NOTE — Assessment & Plan Note (Addendum)
She is improving. I assume she is on therapy for her lumbar wound infection. I will check her sedimentation rate and C-reactive protein and call [facility] home to determine if she is still on levofloxacin and to recommend a quit date. She will follow here in 6 weeks.  Addendum:  Sed Rate (mm/h)  Date Value  05/07/2017 63 (H)   Erythrocyte Sed Rate (mm/hr)  Date Value  04/21/2014 39 (H)   CRP (mg/L)  Date Value  05/07/2017 15.4 (H)   Her inflammatory markers remain elevated. I will extend her therapy with 2 more weeks of levofloxacin.

## 2017-05-07 NOTE — Progress Notes (Addendum)
[facility] for [facility]ious Disease  Patient Active Problem List   Diagnosis Date Noted  . Postoperative wound infection 04/02/2017    Priority: High  . Status post lumbar surgery 02/24/2017    Priority: High  . Lethargy 02/28/2017    Priority: Medium  . Acute encephalopathy 04/11/2017  . Essential hypertension, benign   . Altered consciousness   . Urinary retention   . Pressure injury of skin 03/30/2017  . Wound dehiscence   . Diarrhea   . Altered mental status 03/29/2017  . Anemia 03/14/2017  . Acute blood loss anemia 03/14/2017  . Acute renal failure superimposed on stage 3 chronic kidney disease ([facility]) 03/14/2017  . Bilateral lower extremity edema 03/04/2017  . AKI (acute kidney injury) ([facility]) 02/28/2017  . Obesity, Class III, BMI 40-49.9 (morbid obesity) ([facility]) 02/27/2017  . DM (diabetes mellitus), type 2 with renal complications ([facility]) 54/04/8118  . Hypertensive emergency 01/15/2017  . CKD (chronic kidney disease) stage 3, GFR 30-59 ml/min (HCC) 01/15/2017  . Hypothyroidism 01/15/2017  . Diverticulosis 01/15/2017  . HLD (hyperlipidemia) 01/15/2017  . Spinal stenosis of lumbar region 01/15/2017  . Disease of pancreas 07/21/2012    Patient's Medications  New Prescriptions   No medications on file  Previous Medications   ACETAMINOPHEN (TYLENOL) 500 MG TABLET    Take 2 tablets (1,000 mg total) by mouth 3 (three) times daily.   AMLODIPINE (NORVASC) 5 MG TABLET    Take 1 tablet (5 mg total) by mouth daily.   ATORVASTATIN (LIPITOR) 40 MG TABLET    Take 40 mg by mouth every evening.   BACLOFEN 5 MG TABS    Take 5 mg by mouth 2 (two) times daily as needed for muscle spasms.   CLONIDINE (CATAPRES) 0.3 MG TABLET    Take 0.3 mg by mouth. Take one tablet three times daily   ERGOCALCIFEROL (VITAMIN D2) 50000 UNITS CAPSULE    Take 50,000 Units by mouth once a week. On Tuesdays   FERROUS SULFATE 325 (65 FE) MG TABLET    Take 325 mg by mouth daily with breakfast.   HYDROCHLOROTHIAZIDE (HYDRODIURIL) 25 MG TABLET    Take 25 mg by mouth daily.    LEVOFLOXACIN (LEVAQUIN) 250 MG TABLET    Take 1 tablet (250 mg total) by mouth daily.   LEVOTHYROXINE (SYNTHROID, LEVOTHROID) 88 MCG TABLET    Take 88 mcg by mouth daily before breakfast.    LOSARTAN (COZAAR) 100 MG TABLET    Take 100 mg by mouth daily after breakfast.    MAGNESIUM OXIDE 250 MG TABS    Take 250 mg by mouth daily.   PANTOPRAZOLE (PROTONIX) 40 MG TABLET    Take 1 tablet (40 mg total) by mouth at bedtime.   PROPYLENE GLYCOL-GLYCERIN (SOOTHE) 0.6-0.6 % SOLN    Place 1 drop into both eyes daily as needed (for dry/gritty eyes.).    SPECIALTY VITAMINS PRODUCTS (PROSTATE PO)    Take by mouth. 30 ml twice daily to aid in wound healing  Modified Medications   No medications on file  Discontinued Medications   No medications on file    Subjective: Taylor Tyler is in for her hospital follow-up visit. She is a 76 y.o. female who underwent redo spinal fusion from L3-S1 on 02/26/2017. She developed wound dehiscence and confusion and was readmitted on 03/29/2017. She was taken back to the OR on 03/30/2017. Cloudy fluid was encountered beneath the fascia.  Deep tissue specimen submitted  for culture grew Proteus mirabilis. I recommended treatment with oral levofloxacin. I believe she has now completed 5-1/2 weeks of therapy but I do not have any records today from [facility] home to confirm this. She does not know what medication she is taking. She does not recall meeting me in the hospital. She says she is feeling better. She is having some pain in both knees but very little back pain. Her VAC wound lumbar dressing is being changed 3 times weekly. Her nurses have told her that the wound is looking better.  Review of Systems: Review of Systems  Constitutional: Negative for chills, diaphoresis and fever.  Respiratory: Negative for cough.   Cardiovascular: Negative for chest pain.  Gastrointestinal: Negative for  abdominal pain, diarrhea, nausea and vomiting.  Musculoskeletal: Positive for back pain and joint pain.  Skin: Negative for rash.  Neurological: Negative for headaches.    Past Medical History:  Diagnosis Date  . Acute blood loss anemia 03/14/2017  . Acute renal failure superimposed on stage 3 chronic kidney disease ([place]) 03/14/2017  . AKI (acute kidney injury) ([facility]) 02/28/2017  . Altered mental status 03/29/2017  . Anemia   . Arthritis   . Bilateral lower extremity edema 03/04/2017  . Chronic kidney disease    ?? renal insufficiency,   . CKD (chronic kidney disease) stage 3, GFR 30-59 ml/min (HCC) 01/15/2017   ?? renal insufficiency, which she thinks is coming from "all these medications"  . Diabetes mellitus without complication ([place])    diagnosed 4-5 yrs ago  . Disease of pancreas   . Diverticulosis 01/15/2017  . DJD (degenerative joint disease)   . DM (diabetes mellitus), type 2 with renal complications ([facility]) 03/21/5630  . Hypertension   . Hypothyroidism   . Lethargy 02/28/2017  . Obesity, Class III, BMI 40-49.9 (morbid obesity) ([place] [place]) 02/27/2017  . Postoperative wound infection 04/02/2017  . Spinal stenosis of lumbar region 01/15/2017  . Status post lumbar surgery 02/24/2017  . Wound healing, delayed    back    Social History  Substance Use Topics  . Smoking status: Never Smoker  . Smokeless tobacco: Never Used  . Alcohol use No    Family History  Problem Relation Age of Onset  . Diabetes Father   . Heart attack Father   . Hypertension Father   . Diabetes Mother   . Hypertension Mother     Allergies  Allergen Reactions  . Clams [Shellfish Allergy] Swelling and Other (See Comments)    THROAT SWELLS NECK TURNS RED  . Hydralazine Other (See Comments)    CHEST TIGHTNESS  . Milk-Related Compounds Diarrhea    Objective: Vitals:   05/07/17 0851  BP: 103/73  Pulse: 81  Temp: 97.6 F (36.4 C)  TempSrc: Oral   There is no height or weight on file to calculate  BMI.  Physical Exam  Constitutional: She is oriented to person, place, and time.  She is alert and in good spirits. She is seated in a wheelchair.  Cardiovascular: Normal rate and regular rhythm.   No murmur heard. Pulmonary/Chest: Effort normal and breath sounds normal.  Abdominal: Soft. There is no tenderness.  Musculoskeletal:  There is a VAC wound dressing on her lumbar wound.  Neurological: She is alert and oriented to person, place, and time.  Skin: No rash noted.  Psychiatric: Mood and affect normal.      Problem List Items Addressed This Visit      High   Postoperative wound infection  She is improving. I assume she is on therapy for her lumbar wound infection. I will check her sedimentation rate and C-reactive protein and call [facility] home to determine if she is still on levofloxacin and to recommend a quit date. She will follow here in 6 weeks.  Addendum:  Sed Rate (mm/h)  Date Value  05/07/2017 63 (H)   Erythrocyte Sed Rate (mm/hr)  Date Value  04/21/2014 39 (H)   CRP (mg/L)  Date Value  05/07/2017 15.4 (H)   Her inflammatory markers remain elevated. I will extend her therapy with 2 more weeks of levofloxacin.      Relevant Orders   C-reactive protein (Completed)   Sedimentation rate (Completed)       Michel Bickers, MD [facility] for [place] [phone] pager   [phone] cell 05/08/2017, 8:23 AM

## 2017-05-08 ENCOUNTER — Telehealth: Payer: Self-pay | Admitting: *Deleted

## 2017-05-08 LAB — SEDIMENTATION RATE: Sed Rate: 63 mm/h — ABNORMAL HIGH (ref 0–30)

## 2017-05-08 LAB — C-REACTIVE PROTEIN: CRP: 15.4 mg/L — AB (ref <8.0)

## 2017-05-08 NOTE — Telephone Encounter (Signed)
Spoke to [place], Therapist, sports at [facility] and patient has been receiving the levaquin 250 mg daily, with a stop date of 05/10/17. Per Dr. Megan Salon the Levaquin is to be extended for two more weeks with a crp and sed rate lab to be drawn in four weeks. Follow up appointment scheduled for 06/22/17 and lab results to be faxed to Dr. Megan Salon.

## 2017-05-10 ENCOUNTER — Encounter: Payer: Self-pay | Admitting: Internal Medicine

## 2017-05-10 NOTE — Assessment & Plan Note (Signed)
A1c 5.7 on no medications; patient is on ARB and statins

## 2017-05-10 NOTE — Assessment & Plan Note (Signed)
Controlled on multiple meds; plan to continue clonidine 0.3 mg 3 times a day, Cozaar 100 mg by mouth daily, hydrochlorothiazide 25 mg by mouth daily and Norvasc 5 mg by mouth daily

## 2017-05-10 NOTE — Assessment & Plan Note (Signed)
TSH 0.986 with normal T4; plan to continue Synthroid 88 g daily

## 2017-05-13 ENCOUNTER — Non-Acute Institutional Stay (SKILLED_NURSING_FACILITY): Payer: Medicare Other | Admitting: Internal Medicine

## 2017-05-13 ENCOUNTER — Encounter: Payer: Self-pay | Admitting: Internal Medicine

## 2017-05-13 DIAGNOSIS — E7849 Other hyperlipidemia: Secondary | ICD-10-CM | POA: Diagnosis not present

## 2017-05-13 DIAGNOSIS — E034 Atrophy of thyroid (acquired): Secondary | ICD-10-CM | POA: Diagnosis not present

## 2017-05-13 DIAGNOSIS — R339 Retention of urine, unspecified: Secondary | ICD-10-CM

## 2017-05-13 DIAGNOSIS — Z9889 Other specified postprocedural states: Secondary | ICD-10-CM

## 2017-05-13 DIAGNOSIS — I10 Essential (primary) hypertension: Secondary | ICD-10-CM | POA: Diagnosis not present

## 2017-05-13 DIAGNOSIS — N179 Acute kidney failure, unspecified: Secondary | ICD-10-CM | POA: Diagnosis not present

## 2017-05-13 DIAGNOSIS — T8149XA Infection following a procedure, other surgical site, initial encounter: Secondary | ICD-10-CM | POA: Diagnosis not present

## 2017-05-13 DIAGNOSIS — R600 Localized edema: Secondary | ICD-10-CM

## 2017-05-13 DIAGNOSIS — N183 Chronic kidney disease, stage 3 (moderate): Secondary | ICD-10-CM

## 2017-05-13 DIAGNOSIS — D62 Acute posthemorrhagic anemia: Secondary | ICD-10-CM

## 2017-05-13 DIAGNOSIS — E1122 Type 2 diabetes mellitus with diabetic chronic kidney disease: Secondary | ICD-10-CM

## 2017-05-13 DIAGNOSIS — T8130XA Disruption of wound, unspecified, initial encounter: Secondary | ICD-10-CM

## 2017-05-13 DIAGNOSIS — G9340 Encephalopathy, unspecified: Secondary | ICD-10-CM | POA: Diagnosis not present

## 2017-05-13 NOTE — Progress Notes (Addendum)
Location:  [facility] Room Number: [place]:  SNF [phone]) Hennie Duos, MD  PCP: Lenard Simmer, MD Patient Care Team: Lenard Simmer, MD as PCP - General (Radiology) Ditty, Kevan Ny, MD as Consulting Physician (Neurosurgery)  Extended Emergency Contact Information Primary Emergency Contact: [place] Phone: [phone] Relation: Son Secondary Emergency Contact: Joelyn Oms Address: [id number]          [place], [place] [address] [place] Phone: [phone] Relation: Son  Allergies  Allergen Reactions  . Clams [Shellfish Allergy] Swelling and Other (See Comments)    THROAT SWELLS NECK TURNS RED  . Hydralazine Other (See Comments)    CHEST TIGHTNESS  . Milk-Related Compounds Diarrhea    Chief Complaint  Patient presents with  . Discharge Note    discharge from SNF to home    HPI:  75 y.o. female with hypertension, chronic lower extremity edema, diabetes, hypothyroidism, hyperlipidemia, who was status post lumbar back surgery with a dehisced wound who was admitted to [facility] from 8/26-9/4 for an exploration and debridement of wound which subsequently occurred on day 2 of hospitalization. Patient also had altered mental status while in the hospital which waxed and waned along with hypertension, acute kidney injury, and persistent bradycardia. Patient's wound grew out Proteus mirabilis and this was covered with broad-spectrum IV antibiotics. Gradually patient's problems improved and patient was admitted to skilled nursing facility for wound care and OT/PT. Patient is now ready to be discharged to home.     Past Medical History:  Diagnosis Date  . Acute blood loss anemia 03/14/2017  . Acute renal failure superimposed on stage 3 chronic kidney disease ([place]) 03/14/2017  . AKI (acute kidney injury) ([place]) 02/28/2017  . Altered mental status  03/29/2017  . Anemia   . Arthritis   . Bilateral lower extremity edema 03/04/2017  . Chronic kidney disease    ?? renal insufficiency,   . CKD (chronic kidney disease) stage 3, GFR 30-59 ml/min (HCC) 01/15/2017   ?? renal insufficiency, which she thinks is coming from "all these medications"  . Diabetes mellitus without complication ([place])    diagnosed 4-5 yrs ago  . Disease of pancreas   . Diverticulosis 01/15/2017  . DJD (degenerative joint disease)   . DM (diabetes mellitus), type 2 with renal complications ([place]) 4/62/7035  . Hypertension   . Hypothyroidism   . Lethargy 02/28/2017  . Obesity, Class III, BMI 40-49.9 (morbid obesity) ([place]) 02/27/2017  . Postoperative wound infection 04/02/2017  . Spinal stenosis of lumbar region 01/15/2017  . Status post lumbar surgery 02/24/2017  . Wound healing, delayed    back    Past Surgical History:  Procedure Laterality Date  . ABDOMINAL EXPOSURE N/A 02/24/2017   Procedure: ABDOMINAL EXPOSURE;  Surgeon: Angelia Mould, MD;  Location: [facility];  Service: Vascular;  Laterality: N/A;  . ABDOMINAL HYSTERECTOMY  1987  . ANTERIOR LAT LUMBAR FUSION N/A 02/24/2017   Procedure: Lumbar three- five Anterior lateral lumbar interbody fusion;  Surgeon: Ditty, Kevan Ny, MD;  Location: [facility];  Service: Neurosurgery;  Laterality: N/A;  L3-5 Anterior lateral lumbar interbody fusion with removal of coflex at L3-4, L4-5  . ANTERIOR LUMBAR FUSION N/A 02/24/2017   Procedure: Stage 1: Lumbar five-Sacral one Anterior lumbar interbody fusion;  Surgeon: Ditty, Kevan Ny, MD;  Location: [facility];  Service: Neurosurgery;  Laterality: N/A;  Stage 1: L5-S1  Anterior lumbar interbody fusion  . APPLICATION OF ROBOTIC ASSISTANCE FOR SPINAL PROCEDURE N/A 02/26/2017   Procedure: APPLICATION OF ROBOTIC ASSISTANCE FOR SPINAL PROCEDURE;  Surgeon: Ditty, Kevan Ny, MD;  Location: [facility];  Service: Neurosurgery;  Laterality: N/A;  . APPLICATION OF WOUND VAC N/A 03/30/2017    Procedure: APPLICATION OF WOUND VAC;  Surgeon: Ditty, Kevan Ny, MD;  Location: [facility];  Service: Neurosurgery;  Laterality: N/A;  . BACK SURGERY  2013  . BREAST SURGERY  1992   Breast Reduction  . CHOLECYSTECTOMY  1982  . COLON SURGERY  2015  . COLON SURGERY    . EYE SURGERY Right 2017   cataracts  . [place]  . LUMBAR FUSION  02/24/2017  . LUMBAR LAMINECTOMY WITH SPINOUS PROCESS PLATE 2 LEVEL N/A 1/85/6314   Procedure: LUMBAR LAMINECTOMY/DECOMPRESSION MICRODISCECTOMY CoFlex;  Surgeon: Faythe Ghee, MD;  [facility]: [facility];  Service: Neurosurgery;  Laterality: N/A;  Lumbar three-four,Lumbar Four-Five Laminectomy with Coflex  . LUMBAR WOUND DEBRIDEMENT N/A 03/30/2017   Procedure: Lumbar wound exploration/debridement, placement of wound vac;  Surgeon: Ditty, Kevan Ny, MD;  Location: [facility];  Service: Neurosurgery;  Laterality: N/A;  Lumbar wound exploration/debridement, placement of wound vac     reports that she has never smoked. She has never used smokeless tobacco. She reports that she does not drink alcohol or use drugs. Social History   Social History  . Marital status: Widowed    Spouse name: N/A  . Number of children: N/A  . Years of education: N/A   Occupational History  . retired [employer] work    Social History Main Topics  . Smoking status: Never Smoker  . Smokeless tobacco: Never Used  . Alcohol use No  . Drug use: No  . Sexual activity: No   Other Topics Concern  . Not on file   Social History Narrative   Admitted to [facility] 03/03/17   Widowed   Never smoked   Alcohol none   Full Code    Pertinent  Health Maintenance Due  Topic Date Due  . INFLUENZA VACCINE  03/04/2018 (Originally 03/04/2017)  . FOOT EXAM  03/04/2018 (Originally 03/15/1951)  . MAMMOGRAM  03/04/2018 (Originally 03/15/1993)  . OPHTHALMOLOGY EXAM  03/04/2018 (Originally 03/15/1951)  . DEXA SCAN  03/04/2018 (Originally 03/15/2008)  . COLONOSCOPY   03/04/2018 (Originally 03/15/1993)  . PNA vac Low Risk Adult (1 of 2 - PCV13) 03/04/2018 (Originally 03/15/2008)  . HEMOGLOBIN A1C  08/19/2017    Medications: Allergies as of 05/13/2017      Reactions   Clams [shellfish Allergy] Swelling, Other (See Comments)   THROAT SWELLS NECK TURNS RED   Hydralazine Other (See Comments)   CHEST TIGHTNESS   Milk-related Compounds Diarrhea      Medication List       Accurate as of 05/13/17  3:09 PM. Always use your most recent med list.          acetaminophen 500 MG tablet Commonly known as:  TYLENOL Take 2 tablets (1,000 mg total) by mouth 3 (three) times daily.   amLODipine 5 MG tablet Commonly known as:  NORVASC Take 1 tablet (5 mg total) by mouth daily.   atorvastatin 40 MG tablet Commonly known as:  LIPITOR Take 40 mg by mouth every evening.   Baclofen 5 MG Tabs Take 5 mg by mouth 2 (two) times daily as needed for muscle spasms.   cloNIDine 0.3 MG tablet Commonly known as:  CATAPRES Take 0.3 mg by mouth. Take one tablet three times daily   ergocalciferol 50000 units capsule Commonly known as:  VITAMIN D2 Take 50,000 Units by mouth once a week. On Tuesdays   ferrous sulfate 325 (65 FE) MG tablet Take 325 mg by mouth daily with breakfast.   hydrochlorothiazide 25 MG tablet Commonly known as:  HYDRODIURIL Take 25 mg by mouth daily.   levothyroxine 88 MCG tablet Commonly known as:  SYNTHROID, LEVOTHROID Take 88 mcg by mouth daily before breakfast.   losartan 100 MG tablet Commonly known as:  COZAAR Take 100 mg by mouth daily after breakfast.   Magnesium Oxide 250 MG Tabs Take 250 mg by mouth daily.   pantoprazole 40 MG tablet Commonly known as:  PROTONIX Take 1 tablet (40 mg total) by mouth at bedtime.   PROSTATE PO Take by mouth. 30 ml twice daily to aid in wound healing        Vitals:   05/13/17 1503  BP: 133/80  Pulse: (!) 18  Resp: (!) 57  Temp: 98.1 F (36.7 C)   There is no height or weight on  file to calculate BMI.  Physical Exam  GENERAL APPEARANCE: Alert, conversant. No acute distress.  HEENT: Unremarkable. RESPIRATORY: Breathing is even, unlabored. Lung sounds are clear   CARDIOVASCULAR: Heart RRR no murmurs, rubs or gallops. No peripheral edema.  GASTROINTESTINAL: Abdomen is soft, non-tender, not distended w/ normal bowel sounds.  NEUROLOGIC: Cranial nerves 2-12 grossly intact. Moves all extremities   Labs reviewed: Basic Metabolic Panel:  Recent Labs  01/15/17 1530  03/29/17 2217  04/03/17 0318 04/04/17 0524 04/06/17 1109 04/10/17  NA  --   < >  --   < > 141 142 141 140  K  --   < >  --   < > 3.7 4.2 4.5 4.5  CL  --   < >  --   < > 113* 112* 110  --   CO2  --   < >  --   < > 23 23 25   --   GLUCOSE  --   < >  --   < > 105* 108* 137*  --   BUN  --   < >  --   < > 62* 57* 49* 27*  CREATININE  --   < >  --   < > 2.29* 1.83* 1.97* 1.4*  CALCIUM  --   < >  --   < > 8.6* 8.8* 9.0  --   MG 1.6*  --  2.1  --   --   --   --   --   PHOS 3.4  --  3.1  --   --   --   --   --   < > = values in this interval not displayed. No results found for: [facility] Liver Function Tests:  Recent Labs  01/16/17 0404 03/29/17 1323 03/30/17 1818  AST 24 28 29   ALT 17 18 18   ALKPHOS 40 90 82  BILITOT 0.6 0.6 0.6  PROT 6.7 6.5 5.8*  ALBUMIN 3.1* 2.4* 2.1*    Recent Labs  03/29/17 1323  LIPASE 72*   No results for input(s): AMMONIA in the last 8760 hours. CBC:  Recent Labs  03/29/17 1323  04/02/17 0405 04/03/17 0318 04/04/17 0524 04/10/17  WBC 6.9  < > 11.8* 10.7* 9.0 7.3  NEUTROABS 4.9  --   --   --   --   --  HGB 7.8*  < > 10.4* 9.5* 9.9* 10.3*  HCT 24.8*  < > 32.0* 29.7* 30.9* 33*  MCV 85.5  < > 84.4 84.6 84.7  --   PLT 302  < > 215 228 248 235  < > = values in this interval not displayed. Lipid No results for input(s): CHOL, HDL, LDLCALC, TRIG in the last 8760 hours. Cardiac Enzymes:  Recent Labs  07/06/16 1801 07/06/16 2329 07/07/16 0616    TROPONINI <0.03 <0.03 <0.03   BNP:  Recent Labs  03/29/17 2217  BNP 492.3*   CBG:  Recent Labs  04/07/17 0735 04/07/17 1140 04/07/17 1713  GLUCAP 115* 171* 113*    Procedures and Imaging Studies During Stay: No results found.  Assessment/Plan:   Postoperative wound infection  Wound dehiscence  Status post lumbar surgery  Acute blood loss anemia  Bilateral lower extremity edema  Acute renal failure superimposed on stage 3 chronic kidney disease, unspecified acute renal failure type ([place])  Acute encephalopathy  Accelerated hypertension  Hypothyroidism due to acquired atrophy of thyroid  Type 2 diabetes mellitus with stage 3 chronic kidney disease, without long-term current use of insulin ([place])  Urinary retention  Other hyperlipidemia   Patient is being discharged with the following home health services:  OT/PT/nursing  Patient is being discharged with the following durable medical equipment: Wheelchair   Patient has been advised to f/u with their PCP in 1-2 weeks to bring them up to date on their rehab stay.  Social services at facility was responsible for arranging this appointment.  Pt was provided with a 30 day supply of prescriptions for medications and refills must be obtained from their PCP.  For controlled substances, a more limited supply may be provided adequate until PCP appointment only.  Medications have been reconciled.    Time spent greater than 30 minutes;> 50% of time with patient was spent reviewing records, labs, tests and studies, counseling and developing plan of care  Inocencio Homes, MD  Later entry- pt went into appeals process and won so was not d/c this day

## 2017-05-22 ENCOUNTER — Encounter: Payer: Self-pay | Admitting: Internal Medicine

## 2017-05-22 ENCOUNTER — Non-Acute Institutional Stay (SKILLED_NURSING_FACILITY): Payer: Medicare Other | Admitting: Internal Medicine

## 2017-05-22 DIAGNOSIS — I10 Essential (primary) hypertension: Secondary | ICD-10-CM

## 2017-05-22 DIAGNOSIS — R600 Localized edema: Secondary | ICD-10-CM | POA: Diagnosis not present

## 2017-05-22 NOTE — Progress Notes (Signed)
Location:  [facility] Room Number: [place]:  SNF [phone])  Provider: Noah Delaine. Sheppard Coil, MD  Lenard Simmer, MD  Patient Care Team: Lenard Simmer, MD as PCP - General (Radiology) Ditty, Kevan Ny, MD as Consulting Physician (Neurosurgery)  Extended Emergency Contact Information Primary Emergency Contact: [place] of [place] Phone: [phone] Relation: Son Secondary Emergency Contact: Joelyn Oms Address: [id number]          [place], [place] [address] [place] Phone: [phone] Relation: Son    Allergies: Clams [shellfish allergy]; Hydralazine; and Milk-related compounds  Chief Complaint  Patient presents with  . Acute Visit    bilateral lower legs edema  . other    note: 05/13/17 discharge note-patient won the appeal and staying at [facility] longer    HPI: Patient is 76 y.o. female who Is being seen acutely for complaints of pedal and ankle edema for the past week. Per nursing patient has refused her hydrochlorothiazide because it makes her urinate. Patient has no shortness of breath dyspnea on exertion, PND. Per patient nothing makes it better, nothing makes it worse. Patient does not have a history of congestive heart failure.  Past Medical History:  Diagnosis Date  . Acute blood loss anemia 03/14/2017  . Acute renal failure superimposed on stage 3 chronic kidney disease ([place]) 03/14/2017  . AKI (acute kidney injury) ([place]) 02/28/2017  . Altered mental status 03/29/2017  . Anemia   . Arthritis   . Bilateral lower extremity edema 03/04/2017  . Chronic kidney disease    ?? renal insufficiency,   . CKD (chronic kidney disease) stage 3, GFR 30-59 ml/min (HCC) 01/15/2017   ?? renal insufficiency, which she thinks is coming from "all these medications"  . Diabetes mellitus without complication ([place])    diagnosed 4-5 yrs ago  . Disease of pancreas   .  Diverticulosis 01/15/2017  . DJD (degenerative joint disease)   . DM (diabetes mellitus), type 2 with renal complications ([place]) 4/70/9628  . Hypertension   . Hypothyroidism   . Lethargy 02/28/2017  . Obesity, Class III, BMI 40-49.9 (morbid obesity) ([place]) 02/27/2017  . Postoperative wound infection 04/02/2017  . Spinal stenosis of lumbar region 01/15/2017  . Status post lumbar surgery 02/24/2017  . Wound healing, delayed    back    Past Surgical History:  Procedure Laterality Date  . ABDOMINAL EXPOSURE N/A 02/24/2017   Procedure: ABDOMINAL EXPOSURE;  Surgeon: Angelia Mould, MD;  Location: [facility];  Service: Vascular;  Laterality: N/A;  . ABDOMINAL HYSTERECTOMY  1987  . ANTERIOR LAT LUMBAR FUSION N/A 02/24/2017   Procedure: Lumbar three- five Anterior lateral lumbar interbody fusion;  Surgeon: Ditty, Kevan Ny, MD;  Location: [facility];  Service: Neurosurgery;  Laterality: N/A;  L3-5 Anterior lateral lumbar interbody fusion with removal of coflex at L3-4, L4-5  . ANTERIOR LUMBAR FUSION N/A 02/24/2017   Procedure: Stage 1: Lumbar five-Sacral one Anterior lumbar interbody fusion;  Surgeon: Ditty, Kevan Ny, MD;  Location: [facility];  Service: Neurosurgery;  Laterality: N/A;  Stage 1: L5-S1 Anterior lumbar interbody fusion  . APPLICATION OF ROBOTIC ASSISTANCE FOR SPINAL PROCEDURE N/A 02/26/2017   Procedure: APPLICATION OF ROBOTIC ASSISTANCE FOR SPINAL PROCEDURE;  Surgeon: Ditty, Kevan Ny, MD;  Location: [facility];  Service: Neurosurgery;  Laterality: N/A;  . APPLICATION OF WOUND VAC N/A 03/30/2017   Procedure: APPLICATION OF WOUND VAC;  Surgeon: Tamala Fothergill,  MD;  Location: [facility];  Service: Neurosurgery;  Laterality: N/A;  . BACK SURGERY  2013  . BREAST SURGERY  1992   Breast Reduction  . CHOLECYSTECTOMY  1982  . COLON SURGERY  2015  . COLON SURGERY    . EYE SURGERY Right 2017   cataracts  . [place]  . LUMBAR FUSION  02/24/2017  . LUMBAR LAMINECTOMY WITH  SPINOUS PROCESS PLATE 2 LEVEL N/A 9/92/4268   Procedure: LUMBAR LAMINECTOMY/DECOMPRESSION MICRODISCECTOMY CoFlex;  Surgeon: Faythe Ghee, MD;  [facility]: [facility];  Service: Neurosurgery;  Laterality: N/A;  Lumbar three-four,Lumbar Four-Five Laminectomy with Coflex  . LUMBAR WOUND DEBRIDEMENT N/A 03/30/2017   Procedure: Lumbar wound exploration/debridement, placement of wound vac;  Surgeon: Ditty, Kevan Ny, MD;  Location: [facility];  Service: Neurosurgery;  Laterality: N/A;  Lumbar wound exploration/debridement, placement of wound vac    Allergies as of 05/22/2017      Reactions   Clams [shellfish Allergy] Swelling, Other (See Comments)   THROAT SWELLS NECK TURNS RED   Hydralazine Other (See Comments)   CHEST TIGHTNESS   Milk-related Compounds Diarrhea      Medication List       Accurate as of 05/22/17  5:04 PM. Always use your most recent med list.          acetaminophen 500 MG tablet Commonly known as:  TYLENOL Take 2 tablets (1,000 mg total) by mouth 3 (three) times daily.   amLODipine 5 MG tablet Commonly known as:  NORVASC Take 1 tablet (5 mg total) by mouth daily.   atorvastatin 40 MG tablet Commonly known as:  LIPITOR Take 40 mg by mouth every evening.   Baclofen 5 MG Tabs Take 5 mg by mouth 2 (two) times daily as needed for muscle spasms.   cloNIDine 0.3 MG tablet Commonly known as:  CATAPRES Take 0.3 mg by mouth. Take one tablet three times daily   ergocalciferol 50000 units capsule Commonly known as:  VITAMIN D2 Take 50,000 Units by mouth once a week. On Tuesdays   ferrous sulfate 325 (65 FE) MG tablet Take 325 mg by mouth daily with breakfast.   hydrochlorothiazide 25 MG tablet Commonly known as:  HYDRODIURIL Take 25 mg by mouth daily.   HYDROcodone-acetaminophen 5-325 MG tablet Commonly known as:  NORCO/VICODIN Take 1 tablet by mouth. Take one tablet 30 minutes before treatment   levothyroxine 88 MCG tablet Commonly known as:  SYNTHROID,  LEVOTHROID Take 88 mcg by mouth daily before breakfast.   losartan 100 MG tablet Commonly known as:  COZAAR Take 100 mg by mouth daily after breakfast.   Magnesium Oxide 250 MG Tabs Take 250 mg by mouth daily.   pantoprazole 40 MG tablet Commonly known as:  PROTONIX Take 1 tablet (40 mg total) by mouth at bedtime.   PROSTATE PO Take by mouth. 30 ml twice daily to aid in wound healing   SOOTHE XP Soln Apply to eye. Place one drop in both eyes daily as needed for dry eyes   vitamin C 500 MG tablet Commonly known as:  ASCORBIC ACID Take 500 mg by mouth. Take one tablet twice daily to aid wound healing       Meds ordered this encounter  Medications  . HYDROcodone-acetaminophen (NORCO/VICODIN) 5-325 MG tablet    Sig: Take 1 tablet by mouth. Take one tablet 30 minutes before treatment  . vitamin C (ASCORBIC ACID) 500 MG tablet    Sig: Take 500 mg by mouth.  Take one tablet twice daily to aid wound healing  . Artificial Tear Solution (SOOTHE XP) SOLN    Sig: Apply to eye. Place one drop in both eyes daily as needed for dry eyes    Immunization History  Administered Date(s) Administered  . Influenza-Unspecified 05/26/2012, 04/20/2013    Social History  Substance Use Topics  . Smoking status: Never Smoker  . Smokeless tobacco: Never Used  . Alcohol use No    Review of Systems  DATA OBTAINED: from patient GENERAL:  no fevers, fatigue, appetite changes SKIN: No itching, rash HEENT: No complaint RESPIRATORY: No cough, wheezing, SOB CARDIAC: No chest pain, palpitations, +lower extremity edemaAs per history of present illness  GI: No abdominal pain, No N/V/D or constipation, No heartburn or reflux  GU: No dysuria, frequency or urgency, or incontinence  MUSCULOSKELETAL: No unrelieved bone/joint pain NEUROLOGIC: No headache, dizziness  PSYCHIATRIC: No overt anxiety or sadness  Vitals:   05/22/17 1654  BP: (!) 139/56  Pulse: (!) 54  Resp: 20  Temp: (!) 97.1 F (36.2  C)   Body mass index is 37.41 kg/m. Physical Exam  GENERAL APPEARANCE: Alert, conversant, No acute distress  SKIN: No diaphoresis rash HEENT: Unremarkable RESPIRATORY: Breathing is even, unlabored. Lung sounds are clear   CARDIOVASCULAR: Heart RRR no murmurs, rubs or gallops. 3/4+ peripheral edema feet and ankles GASTROINTESTINAL: Abdomen is soft, non-tender, not distended w/ normal bowel sounds.  GENITOURINARY: Bladder non tender, not distended  MUSCULOSKELETAL: No abnormal joints or musculature NEUROLOGIC: Cranial nerves 2-12 grossly intact. Moves all extremities PSYCHIATRIC: Mood and affect appropriate to situation, no behavioral issues  Patient Active Problem List   Diagnosis Date Noted  . Acute encephalopathy 04/11/2017  . Essential hypertension, benign   . Altered consciousness   . Postoperative wound infection 04/02/2017  . Urinary retention   . Pressure injury of skin 03/30/2017  . Wound dehiscence   . Diarrhea   . Altered mental status 03/29/2017  . Anemia 03/14/2017  . Acute blood loss anemia 03/14/2017  . Acute renal failure superimposed on stage 3 chronic kidney disease ([place]) 03/14/2017  . Bilateral lower extremity edema 03/04/2017  . AKI (acute kidney injury) ([place]) 02/28/2017  . Lethargy 02/28/2017  . Obesity, Class III, BMI 40-49.9 (morbid obesity) ([place]) 02/27/2017  . DM (diabetes mellitus), type 2 with renal complications ([place]) 92/42/6834  . Status post lumbar surgery 02/24/2017  . Hypertensive emergency 01/15/2017  . CKD (chronic kidney disease) stage 3, GFR 30-59 ml/min (HCC) 01/15/2017  . Hypothyroidism 01/15/2017  . Diverticulosis 01/15/2017  . HLD (hyperlipidemia) 01/15/2017  . Spinal stenosis of lumbar region 01/15/2017  . Disease of pancreas 07/21/2012    CMP     Component Value Date/Time   NA 140 04/10/2017   NA 140 04/22/2014 0210   K 4.5 04/10/2017   K 4.0 04/22/2014 0210   CL 110 04/06/2017 1109   CL 108 (H) 04/22/2014 0210   CO2 25  04/06/2017 1109   CO2 24 04/22/2014 0210   GLUCOSE 137 (H) 04/06/2017 1109   GLUCOSE 78 04/22/2014 0210   BUN 27 (A) 04/10/2017   BUN 27 (H) 04/22/2014 0210   CREATININE 1.4 (A) 04/10/2017   CREATININE 1.97 (H) 04/06/2017 1109   CREATININE 1.09 04/22/2014 0210   CALCIUM 9.0 04/06/2017 1109   CALCIUM 8.5 04/22/2014 0210   PROT 5.8 (L) 03/30/2017 1818   PROT 5.4 (L) 01/27/2014 0503   ALBUMIN 2.1 (L) 03/30/2017 1818   ALBUMIN 2.1 (L) 01/27/2014 0503  AST 29 03/30/2017 1818   AST 71 (H) 01/27/2014 0503   ALT 18 03/30/2017 1818   ALT 43 01/27/2014 0503   ALKPHOS 82 03/30/2017 1818   ALKPHOS 41 (L) 01/27/2014 0503   BILITOT 0.6 03/30/2017 1818   BILITOT 0.4 01/27/2014 0503   GFRNONAA 24 (L) 04/06/2017 1109   GFRNONAA 51 (L) 04/22/2014 0210   GFRAA 28 (L) 04/06/2017 1109   GFRAA 59 (L) 04/22/2014 0210    Recent Labs  01/15/17 1530  03/29/17 2217  04/03/17 0318 04/04/17 0524 04/06/17 1109 04/10/17  NA  --   < >  --   < > 141 142 141 140  K  --   < >  --   < > 3.7 4.2 4.5 4.5  CL  --   < >  --   < > 113* 112* 110  --   CO2  --   < >  --   < > 23 23 25   --   GLUCOSE  --   < >  --   < > 105* 108* 137*  --   BUN  --   < >  --   < > 62* 57* 49* 27*  CREATININE  --   < >  --   < > 2.29* 1.83* 1.97* 1.4*  CALCIUM  --   < >  --   < > 8.6* 8.8* 9.0  --   MG 1.6*  --  2.1  --   --   --   --   --   PHOS 3.4  --  3.1  --   --   --   --   --   < > = values in this interval not displayed.  Recent Labs  01/16/17 0404 03/29/17 1323 03/30/17 1818  AST 24 28 29   ALT 17 18 18   ALKPHOS 40 90 82  BILITOT 0.6 0.6 0.6  PROT 6.7 6.5 5.8*  ALBUMIN 3.1* 2.4* 2.1*    Recent Labs  03/29/17 1323  04/02/17 0405 04/03/17 0318 04/04/17 0524 04/10/17  WBC 6.9  < > 11.8* 10.7* 9.0 7.3  NEUTROABS 4.9  --   --   --   --   --   HGB 7.8*  < > 10.4* 9.5* 9.9* 10.3*  HCT 24.8*  < > 32.0* 29.7* 30.9* 33*  MCV 85.5  < > 84.4 84.6 84.7  --   PLT 302  < > 215 228 248 235  < > = values in this  interval not displayed. No results for input(s): CHOL, LDLCALC, TRIG in the last 8760 hours.  Invalid input(s): HCL No results found for: MICROALBUR Lab Results  Component Value Date   TSH 0.984 03/29/2017   Lab Results  Component Value Date   HGBA1C 5.7 (H) 02/16/2017   No results found for: CHOL, HDL, LDLCALC, LDLDIRECT, TRIG, CHOLHDL  Significant Diagnostic Results in last 30 days:  No results found.  Assessment and Plan  BILATERAL ANKLE AND PEDAL EDEMA/HYPERTENSION-patient is on Norvasc 5 mg by mouth daily, will DC this as it causes lower extremity edema. Have DC'd hydrochlorothiazide since patient is not using it. Has started hydralazine 10 mg by mouth 3 times a day and have discussed with patient wearing TED hose and she says that she will; patient not earlier that she had ever been on Lasix but later in the day she came to me and said she realized that she had been on Lasix before she  was put in the hospital; and for lower extremity edema does not clear up we will put her back on a low-dose of Lasix    Taylor Soria D. Sheppard Coil, MD

## 2017-05-24 ENCOUNTER — Encounter: Payer: Self-pay | Admitting: Internal Medicine

## 2017-05-29 ENCOUNTER — Encounter: Payer: Self-pay | Admitting: Internal Medicine

## 2017-05-29 ENCOUNTER — Non-Acute Institutional Stay (SKILLED_NURSING_FACILITY): Payer: Medicare Other | Admitting: Internal Medicine

## 2017-05-29 DIAGNOSIS — R339 Retention of urine, unspecified: Secondary | ICD-10-CM | POA: Diagnosis not present

## 2017-05-29 DIAGNOSIS — G9340 Encephalopathy, unspecified: Secondary | ICD-10-CM

## 2017-05-29 DIAGNOSIS — N183 Chronic kidney disease, stage 3 (moderate): Secondary | ICD-10-CM | POA: Diagnosis not present

## 2017-05-29 DIAGNOSIS — D62 Acute posthemorrhagic anemia: Secondary | ICD-10-CM

## 2017-05-29 DIAGNOSIS — E1122 Type 2 diabetes mellitus with diabetic chronic kidney disease: Secondary | ICD-10-CM

## 2017-05-29 DIAGNOSIS — E7849 Other hyperlipidemia: Secondary | ICD-10-CM

## 2017-05-29 DIAGNOSIS — N179 Acute kidney failure, unspecified: Secondary | ICD-10-CM

## 2017-05-29 DIAGNOSIS — T8149XA Infection following a procedure, other surgical site, initial encounter: Secondary | ICD-10-CM | POA: Diagnosis not present

## 2017-05-29 DIAGNOSIS — E034 Atrophy of thyroid (acquired): Secondary | ICD-10-CM | POA: Diagnosis not present

## 2017-05-29 DIAGNOSIS — M48061 Spinal stenosis, lumbar region without neurogenic claudication: Secondary | ICD-10-CM | POA: Diagnosis not present

## 2017-05-29 DIAGNOSIS — T8130XA Disruption of wound, unspecified, initial encounter: Secondary | ICD-10-CM | POA: Diagnosis not present

## 2017-05-29 DIAGNOSIS — I10 Essential (primary) hypertension: Secondary | ICD-10-CM | POA: Diagnosis not present

## 2017-05-29 NOTE — Progress Notes (Signed)
Location:  [facility] Room Number: [place]:  SNF [phone])  Provider: Noah Delaine. Sheppard Coil, MD  PCP: Lenard Simmer, MD Patient Care Team: Lenard Simmer, MD as PCP - General (Radiology) Ditty, Kevan Ny, MD as Consulting Physician (Neurosurgery)  Extended Emergency Contact Information Primary Emergency Contact: [place] Phone: [phone] Relation: Son Secondary Emergency Contact: Joelyn Oms Address: [id number]          [place], [place] [address] [place] Phone: [phone] Relation: Son  Allergies  Allergen Reactions  . Clams [Shellfish Allergy] Swelling and Other (See Comments)    THROAT SWELLS NECK TURNS RED  . Hydralazine Other (See Comments)    CHEST TIGHTNESS  . Milk-Related Compounds Diarrhea    Chief Complaint  Patient presents with  . Discharge Note    discharge from SNF to home     HPI:  76 y.o. female  with hypertension, and chronic lower extremity edema, diabetes, hypothyroidism, hyperlipidemia, who is status post lumbar back surgery with dehisced wound was admitted to [facility] from 8/26-9/4 for an exploration and debridement of wound which subsequently occurred on day 2 of hospitalization. Patient had altered mental status and while in hospital which waxed and wane along with hypotension, acute kidney injury, and persistent bradycardia. Patient's mood out Proteus mirabilis and this was covered with broad-spectrum IV antibiotics. Gradually patient's problems improved and patient was admitted to skilled nursing facility for wound care and OT/PT. Patient was discharged on in the month admitted in appearance her insurance company so was able to stay longer. She hasn't feels process and patient at this minute however it is possible that she will have to be discharged tomorrow which is Saturday; we are doing a discharge summary today to be sure  that she has what she needs. Patient is to be discharged to home.    Past Medical History:  Diagnosis Date  . Acute blood loss anemia 03/14/2017  . Acute renal failure superimposed on stage 3 chronic kidney disease ([place]) 03/14/2017  . AKI (acute kidney injury) ([place]) 02/28/2017  . Altered mental status 03/29/2017  . Anemia   . Arthritis   . Bilateral lower extremity edema 03/04/2017  . Chronic kidney disease    ?? renal insufficiency,   . CKD (chronic kidney disease) stage 3, GFR 30-59 ml/min (HCC) 01/15/2017   ?? renal insufficiency, which she thinks is coming from "all these medications"  . Diabetes mellitus without complication ([place])    diagnosed 4-5 yrs ago  . Disease of pancreas   . Diverticulosis 01/15/2017  . DJD (degenerative joint disease)   . DM (diabetes mellitus), type 2 with renal complications ([place]) 7/61/9509  . Hypertension   . Hypothyroidism   . Lethargy 02/28/2017  . Obesity, Class III, BMI 40-49.9 (morbid obesity) ([place]) 02/27/2017  . Postoperative wound infection 04/02/2017  . Spinal stenosis of lumbar region 01/15/2017  . Status post lumbar surgery 02/24/2017  . Wound healing, delayed    back    Past Surgical History:  Procedure Laterality Date  . ABDOMINAL EXPOSURE N/A 02/24/2017   Procedure: ABDOMINAL EXPOSURE;  Surgeon: Angelia Mould, MD;  Location: [facility];  Service: Vascular;  Laterality: N/A;  . ABDOMINAL HYSTERECTOMY  1987  . ANTERIOR LAT LUMBAR FUSION N/A 02/24/2017   Procedure: Lumbar three- five Anterior lateral lumbar interbody fusion;  Surgeon: Ditty, Kevan Ny, MD;  Location: [facility];  Service: Neurosurgery;  Laterality: N/A;  L3-5 Anterior lateral lumbar interbody fusion with removal of coflex at L3-4, L4-5  . ANTERIOR LUMBAR FUSION N/A 02/24/2017   Procedure: Stage 1: Lumbar five-Sacral one Anterior lumbar interbody fusion;  Surgeon: Ditty, Kevan Ny, MD;  Location: [facility];  Service: Neurosurgery;  Laterality: N/A;  Stage 1: L5-S1 Anterior  lumbar interbody fusion  . APPLICATION OF ROBOTIC ASSISTANCE FOR SPINAL PROCEDURE N/A 02/26/2017   Procedure: APPLICATION OF ROBOTIC ASSISTANCE FOR SPINAL PROCEDURE;  Surgeon: Ditty, Kevan Ny, MD;  Location: [facility];  Service: Neurosurgery;  Laterality: N/A;  . APPLICATION OF WOUND VAC N/A 03/30/2017   Procedure: APPLICATION OF WOUND VAC;  Surgeon: Ditty, Kevan Ny, MD;  Location: [facility];  Service: Neurosurgery;  Laterality: N/A;  . BACK SURGERY  2013  . BREAST SURGERY  1992   Breast Reduction  . CHOLECYSTECTOMY  1982  . COLON SURGERY  2015  . COLON SURGERY    . EYE SURGERY Right 2017   cataracts  . [place]  . LUMBAR FUSION  02/24/2017  . LUMBAR LAMINECTOMY WITH SPINOUS PROCESS PLATE 2 LEVEL N/A 5/97/4163   Procedure: LUMBAR LAMINECTOMY/DECOMPRESSION MICRODISCECTOMY CoFlex;  Surgeon: Faythe Ghee, MD;  Location: [facility];  Service: Neurosurgery;  Laterality: N/A;  Lumbar three-four,Lumbar Four-Five Laminectomy with Coflex  . LUMBAR WOUND DEBRIDEMENT N/A 03/30/2017   Procedure: Lumbar wound exploration/debridement, placement of wound vac;  Surgeon: Ditty, Kevan Ny, MD;  Location: [facility];  Service: Neurosurgery;  Laterality: N/A;  Lumbar wound exploration/debridement, placement of wound vac     reports that she has never smoked. She has never used smokeless tobacco. She reports that she does not drink alcohol or use drugs. Social History   Social History  . Marital status: Widowed    Spouse name: N/A  . Number of children: N/A  . Years of education: N/A   Occupational History  . retired [employer] work    Social History Main Topics  . Smoking status: Never Smoker  . Smokeless tobacco: Never Used  . Alcohol use No  . Drug use: No  . Sexual activity: No   Other Topics Concern  . Not on file   Social History Narrative   Admitted to [facility] 03/03/17   Widowed   Never smoked   Alcohol none   Full Code    Pertinent  Health  Maintenance Due  Topic Date Due  . INFLUENZA VACCINE  03/04/2018 (Originally 03/04/2017)  . FOOT EXAM  03/04/2018 (Originally 03/15/1951)  . MAMMOGRAM  03/04/2018 (Originally 03/15/1993)  . OPHTHALMOLOGY EXAM  03/04/2018 (Originally 03/15/1951)  . DEXA SCAN  03/04/2018 (Originally 03/15/2008)  . COLONOSCOPY  03/04/2018 (Originally 03/15/1993)  . PNA vac Low Risk Adult (1 of 2 - PCV13) 03/04/2018 (Originally 03/15/2008)  . HEMOGLOBIN A1C  08/19/2017    Medications: Allergies as of 05/29/2017      Reactions   Clams [shellfish Allergy] Swelling, Other (See Comments)   THROAT SWELLS NECK TURNS RED   Hydralazine Other (See Comments)   CHEST TIGHTNESS   Milk-related Compounds Diarrhea      Medication List       Accurate as of 05/29/17  2:22 PM. Always use your most recent med list.          acetaminophen 500 MG tablet Commonly known as:  TYLENOL Take 2 tablets (1,000 mg total) by mouth 3 (three) times daily.   atorvastatin 40 MG tablet Commonly known as:  LIPITOR Take 40  mg by mouth every evening.   Baclofen 5 MG Tabs Take 5 mg by mouth 2 (two) times daily as needed for muscle spasms.   cloNIDine 0.3 MG tablet Commonly known as:  CATAPRES Take 0.3 mg by mouth. Take one tablet three times daily   ergocalciferol 50000 units capsule Commonly known as:  VITAMIN D2 Take 50,000 Units by mouth once a week. On Tuesdays   ferrous sulfate 325 (65 FE) MG tablet Take 325 mg by mouth daily with breakfast.   HYDROcodone-acetaminophen 5-325 MG tablet Commonly known as:  NORCO/VICODIN Take 1 tablet by mouth. Take one tablet 30 minutes before treatment   levothyroxine 88 MCG tablet Commonly known as:  SYNTHROID, LEVOTHROID Take 88 mcg by mouth daily before breakfast.   losartan 100 MG tablet Commonly known as:  COZAAR Take 100 mg by mouth daily after breakfast.   Magnesium Oxide 250 MG Tabs Take 250 mg by mouth daily.   pantoprazole 40 MG tablet Commonly known as:   PROTONIX Take 1 tablet (40 mg total) by mouth at bedtime.   PROSTATE PO Take by mouth. 30 ml twice daily to aid in wound healing   vitamin C 500 MG tablet Commonly known as:  ASCORBIC ACID Take 500 mg by mouth. Take one tablet twice daily to aid wound healing        Vitals:   05/29/17 1411  BP: (!) 143/60  Pulse: 78  Resp: 18  Temp: (!) 97.3 F (36.3 C)  Weight: 198 lb (89.8 kg)  Height: 5\' 1"  (1.549 m)   Body mass index is 37.41 kg/m.  Physical Exam  GENERAL APPEARANCE: Alert, conversant. No acute distress.  HEENT: Unremarkable. RESPIRATORY: Breathing is even, unlabored. Lung sounds are clear   CARDIOVASCULAR: Heart RRR no murmurs, rubs or gallops. No peripheral edema.  GASTROINTESTINAL: Abdomen is soft, non-tender, not distended w/ normal bowel sounds.  NEUROLOGIC: Cranial nerves 2-12 grossly intact. Moves all extremities   Labs reviewed: Basic Metabolic Panel:  Recent Labs  01/15/17 1530  03/29/17 2217  04/03/17 0318 04/04/17 0524 04/06/17 1109 04/10/17  NA  --   < >  --   < > 141 142 141 140  K  --   < >  --   < > 3.7 4.2 4.5 4.5  CL  --   < >  --   < > 113* 112* 110  --   CO2  --   < >  --   < > 23 23 25   --   GLUCOSE  --   < >  --   < > 105* 108* 137*  --   BUN  --   < >  --   < > 62* 57* 49* 27*  CREATININE  --   < >  --   < > 2.29* 1.83* 1.97* 1.4*  CALCIUM  --   < >  --   < > 8.6* 8.8* 9.0  --   MG 1.6*  --  2.1  --   --   --   --   --   PHOS 3.4  --  3.1  --   --   --   --   --   < > = values in this interval not displayed. No results found for: [facility] Liver Function Tests:  Recent Labs  01/16/17 0404 03/29/17 1323 03/30/17 1818  AST 24 28 29   ALT 17 18 18   ALKPHOS 40 90 82  BILITOT 0.6 0.6  0.6  PROT 6.7 6.5 5.8*  ALBUMIN 3.1* 2.4* 2.1*    Recent Labs  03/29/17 1323  LIPASE 72*   No results for input(s): AMMONIA in the last 8760 hours. CBC:  Recent Labs  03/29/17 1323  04/02/17 0405 04/03/17 0318 04/04/17 0524  04/10/17  WBC 6.9  < > 11.8* 10.7* 9.0 7.3  NEUTROABS 4.9  --   --   --   --   --   HGB 7.8*  < > 10.4* 9.5* 9.9* 10.3*  HCT 24.8*  < > 32.0* 29.7* 30.9* 33*  MCV 85.5  < > 84.4 84.6 84.7  --   PLT 302  < > 215 228 248 235  < > = values in this interval not displayed. Lipid No results for input(s): CHOL, HDL, LDLCALC, TRIG in the last 8760 hours. Cardiac Enzymes:  Recent Labs  07/06/16 1801 07/06/16 2329 07/07/16 0616  TROPONINI <0.03 <0.03 <0.03   BNP:  Recent Labs  03/29/17 2217  BNP 492.3*   CBG:  Recent Labs  04/07/17 0735 04/07/17 1140 04/07/17 1713  GLUCAP 115* 171* 113*    Procedures and Imaging Studies During Stay: No results found.  Assessment/Plan:    Wound dehiscence  Status post lumbar surgery  Acute blood loss anemia Bilateral lower extremity edema  Acute renal failure superimposed on chronic kidney disease stage III  Acute encephalopathy  Accelerated hypertension  Hypothyroidism due to acquired atrophy of thyroid Type 2 diabetes mellitus stage III chronic kidney disease  Urinary retention  Other hyperlipidemia   Patient is being discharged with the following home health services: OT/PT/nursing   Patient is being discharged with the following durable medical equipment:  Wheelchair   Patient has been advised to f/u with their PCP in 1-2 weeks to bring them up to date on their rehab stay.  Social services at facility was responsible for arranging this appointment.  Pt was provided with a 30 day supply of prescriptions for medications and refills must be obtained from their PCP.  For controlled substances, a more limited supply may be provided adequate until PCP appointment only.  Patient's medications have been reconciled.    Time spent greater than 30 minutes ;> 50% of time with patient was spent reviewing records, labs, tests and studies, counseling and developing plan of care  Noah Delaine. Sheppard Coil, MD

## 2017-05-29 NOTE — Addendum Note (Signed)
Addended by: Inocencio Homes D on: 05/29/2017 04:59 PM   Modules accepted: Level of Service

## 2017-05-29 NOTE — Addendum Note (Signed)
Addended by: Inocencio Homes D on: 05/29/2017 05:00 PM   Modules accepted: Level of Service

## 2017-06-22 ENCOUNTER — Ambulatory Visit: Payer: Self-pay | Admitting: Internal Medicine

## 2017-07-02 ENCOUNTER — Ambulatory Visit (INDEPENDENT_AMBULATORY_CARE_PROVIDER_SITE_OTHER): Payer: Medicare Other | Admitting: Internal Medicine

## 2017-07-02 ENCOUNTER — Encounter: Payer: Self-pay | Admitting: Internal Medicine

## 2017-07-02 DIAGNOSIS — T8149XA Infection following a procedure, other surgical site, initial encounter: Secondary | ICD-10-CM | POA: Diagnosis not present

## 2017-07-02 NOTE — Progress Notes (Signed)
[facility]ious [facility]  Patient Active Problem List   Diagnosis Date Noted  . Postoperative wound infection 04/02/2017    Priority: High  . Status post lumbar surgery 02/24/2017    Priority: High  . Lethargy 02/28/2017    Priority: Medium  . Acute encephalopathy 04/11/2017  . Essential hypertension, benign   . Altered consciousness   . Urinary retention   . Pressure injury of skin 03/30/2017  . Wound dehiscence   . Diarrhea   . Altered mental status 03/29/2017  . Anemia 03/14/2017  . Acute blood loss anemia 03/14/2017  . Acute renal failure superimposed on stage 3 chronic kidney disease ([place]) 03/14/2017  . Bilateral lower extremity edema 03/04/2017  . AKI (acute kidney injury) ([place]) 02/28/2017  . Obesity, Class III, BMI 40-49.9 (morbid obesity) ([place]) 02/27/2017  . DM (diabetes mellitus), type 2 with renal complications ([place]) 94/17/4081  . Hypertensive emergency 01/15/2017  . CKD (chronic kidney disease) stage 3, GFR 30-59 ml/min (HCC) 01/15/2017  . Hypothyroidism 01/15/2017  . Diverticulosis 01/15/2017  . HLD (hyperlipidemia) 01/15/2017  . Spinal stenosis of lumbar region 01/15/2017  . Disease of pancreas 07/21/2012      Medication List        Accurate as of 07/02/17  9:31 AM. Always use your most recent med list.          acetaminophen 500 MG tablet Commonly known as:  TYLENOL Take 2 tablets (1,000 mg total) by mouth 3 (three) times daily.   atorvastatin 40 MG tablet Commonly known as:  LIPITOR   Baclofen 5 MG Tabs Take 5 mg by mouth 2 (two) times daily as needed for muscle spasms.   cloNIDine 0.3 MG tablet Commonly known as:  CATAPRES   ergocalciferol 50000 units capsule Commonly known as:  VITAMIN D2   ferrous sulfate 325 (65 FE) MG tablet   HYDROcodone-acetaminophen 5-325 MG tablet Commonly known as:  NORCO/VICODIN   levothyroxine 88 MCG tablet Commonly known as:  SYNTHROID, LEVOTHROID   losartan 100 MG  tablet Commonly known as:  COZAAR   Magnesium Oxide 250 MG Tabs   pantoprazole 40 MG tablet Commonly known as:  PROTONIX Take 1 tablet (40 mg total) by mouth at bedtime.   PROSTATE PO   vitamin C 500 MG tablet Commonly known as:  ASCORBIC ACID       Subjective: [name] is in with her son for her routine follow-up visit. She says that she is improving. She is having very little back pain. She completed 6 weeks of oral levofloxacin for her Proteus lumbar wound infection on 05/21/2017. She still has a VAC wound dressing in place. It is being changed every Monday, Wednesday and Friday. Her nurse has told her that the wound is looking good and getting smaller.  Review of Systems: Review of Systems  Constitutional: Negative for chills, diaphoresis, fever, malaise/fatigue and weight loss.  HENT: Negative for sore throat.   Respiratory: Negative for cough, sputum production and shortness of breath.   Cardiovascular: Negative for chest pain.  Gastrointestinal: Negative for abdominal pain, diarrhea, heartburn, nausea and vomiting.  Musculoskeletal: Positive for back pain. Negative for joint pain and myalgias.  Skin: Negative for rash.  Neurological: Negative for dizziness and headaches.    Past Medical History:  Diagnosis Date  . Acute blood loss anemia 03/14/2017  . Acute renal failure superimposed on stage 3 chronic kidney disease ([place]) 03/14/2017  . AKI (acute kidney injury) ([place])  02/28/2017  . Altered mental status 03/29/2017  . Anemia   . Arthritis   . Bilateral lower extremity edema 03/04/2017  . Chronic kidney disease    ?? renal insufficiency,   . CKD (chronic kidney disease) stage 3, GFR 30-59 ml/min (HCC) 01/15/2017   ?? renal insufficiency, which she thinks is coming from "all these medications"  . Diabetes mellitus without complication ([place])    diagnosed 4-5 yrs ago  . Disease of pancreas   . Diverticulosis 01/15/2017  . DJD (degenerative joint disease)   . DM (diabetes  mellitus), type 2 with renal complications ([place]) 3/88/8280  . Hypertension   . Hypothyroidism   . Lethargy 02/28/2017  . Obesity, Class III, BMI 40-49.9 (morbid obesity) ([place]) 02/27/2017  . Postoperative wound infection 04/02/2017  . Spinal stenosis of lumbar region 01/15/2017  . Status post lumbar surgery 02/24/2017  . Wound healing, delayed    back    Social History   Tobacco Use  . Smoking status: Never Smoker  . Smokeless tobacco: Never Used  Substance Use Topics  . Alcohol use: No  . Drug use: No    Family History  Problem Relation Age of Onset  . Diabetes Father   . Heart attack Father   . Hypertension Father   . Diabetes Mother   . Hypertension Mother     Allergies  Allergen Reactions  . Clams [Shellfish Allergy] Swelling and Other (See Comments)    THROAT SWELLS NECK TURNS RED  . Hydralazine Other (See Comments)    CHEST TIGHTNESS  . Milk-Related Compounds Diarrhea    Objective: Vitals:   07/02/17 0906  BP: (!) 173/72  Pulse: 60  Temp: 97.8 F (36.6 C)  TempSrc: Oral  Weight: 185 lb (83.9 kg)  Height: 5\' 2"  (1.575 m)   Body mass index is 33.84 kg/m.  Physical Exam  Constitutional: She is oriented to person, place, and time. No distress.  Cardiovascular: Normal rate and regular rhythm.  No murmur heard. Pulmonary/Chest: Effort normal and breath sounds normal.  Abdominal: Soft. She exhibits no distension. There is no tenderness.  Musculoskeletal:  Her lumbar incision is about 2-1/2 inches in length top to bottom. There is a VAC wound dressing in place. There is very little drainage in the tube. There is no surrounding redness or fluctuance.  She has bilateral pedal edema left greater than right.  Neurological: She is alert and oriented to person, place, and time.  Skin: No rash noted.  Psychiatric: Mood and affect normal.    Lab Results    Problem List Items Addressed This Visit      High   Postoperative wound infection    I believe that  her postoperative lumbar wound infection has been cured. She can follow-up here as needed.          Michel Bickers, MD [facility] [phone] pager   [phone] cell 07/02/2017, 9:31 AM

## 2017-07-02 NOTE — Assessment & Plan Note (Signed)
I believe that her postoperative lumbar wound infection has been cured. She can follow-up here as needed.

## 2017-07-10 ENCOUNTER — Ambulatory Visit: Payer: Self-pay | Admitting: Internal Medicine

## 2017-07-21 ENCOUNTER — Ambulatory Visit: Payer: Medicare Other | Admitting: Internal Medicine

## 2017-07-21 ENCOUNTER — Encounter: Payer: Self-pay | Admitting: Internal Medicine

## 2017-07-21 VITALS — BP 158/78 | HR 56 | Temp 97.7°F | Wt 197.1 lb

## 2017-07-21 DIAGNOSIS — I10 Essential (primary) hypertension: Secondary | ICD-10-CM | POA: Diagnosis not present

## 2017-07-21 DIAGNOSIS — E1122 Type 2 diabetes mellitus with diabetic chronic kidney disease: Secondary | ICD-10-CM

## 2017-07-21 DIAGNOSIS — Z1159 Encounter for screening for other viral diseases: Secondary | ICD-10-CM | POA: Diagnosis not present

## 2017-07-21 DIAGNOSIS — E559 Vitamin D deficiency, unspecified: Secondary | ICD-10-CM

## 2017-07-21 DIAGNOSIS — R609 Edema, unspecified: Secondary | ICD-10-CM

## 2017-07-21 DIAGNOSIS — D649 Anemia, unspecified: Secondary | ICD-10-CM

## 2017-07-21 DIAGNOSIS — E7849 Other hyperlipidemia: Secondary | ICD-10-CM

## 2017-07-21 DIAGNOSIS — Z1231 Encounter for screening mammogram for malignant neoplasm of breast: Secondary | ICD-10-CM | POA: Diagnosis not present

## 2017-07-21 DIAGNOSIS — N184 Chronic kidney disease, stage 4 (severe): Secondary | ICD-10-CM | POA: Insufficient documentation

## 2017-07-21 DIAGNOSIS — Z9889 Other specified postprocedural states: Secondary | ICD-10-CM | POA: Diagnosis not present

## 2017-07-21 DIAGNOSIS — E039 Hypothyroidism, unspecified: Secondary | ICD-10-CM

## 2017-07-21 LAB — LIPID PANEL
CHOLESTEROL: 194 mg/dL (ref 0–200)
HDL: 51.2 mg/dL (ref >39.00)
LDL CALC: 109 mg/dL — AB (ref 0–99)
NonHDL: 142.94
TRIGLYCERIDES: 172 mg/dL — AB (ref 0.0–149.0)
Total CHOL/HDL Ratio: 4
VLDL: 34.4 mg/dL (ref 0.0–40.0)

## 2017-07-21 LAB — URINALYSIS, ROUTINE W REFLEX MICROSCOPIC
BILIRUBIN URINE: NEGATIVE
HGB URINE DIPSTICK: NEGATIVE
Ketones, ur: NEGATIVE
Leukocytes, UA: NEGATIVE
Nitrite: NEGATIVE
RBC / HPF: NONE SEEN (ref 0–?)
Specific Gravity, Urine: >=1.03 — AB (ref 1.000–1.030)
TOTAL PROTEIN, URINE-UPE24: 100 — AB
UROBILINOGEN UA: 0.2 (ref 0.0–1.0)
Urine Glucose: NEGATIVE
pH: 5.5 (ref 5.0–8.0)

## 2017-07-21 LAB — COMPREHENSIVE METABOLIC PANEL
ALK PHOS: 42 U/L (ref 39–117)
ALT: 10 U/L (ref 0–35)
AST: 19 U/L (ref 0–37)
Albumin: 3.5 g/dL (ref 3.5–5.2)
BILIRUBIN TOTAL: 0.4 mg/dL (ref 0.2–1.2)
BUN: 24 mg/dL — AB (ref 6–23)
CO2: 25 mEq/L (ref 19–32)
Calcium: 9.3 mg/dL (ref 8.4–10.5)
Chloride: 107 mEq/L (ref 96–112)
Creatinine, Ser: 1.46 mg/dL — ABNORMAL HIGH (ref 0.40–1.20)
GFR: 45 mL/min — AB (ref >60.00)
GLUCOSE: 104 mg/dL — AB (ref 70–99)
POTASSIUM: 4.2 meq/L (ref 3.5–5.1)
SODIUM: 138 meq/L (ref 135–145)
TOTAL PROTEIN: 7.6 g/dL (ref 6.0–8.3)

## 2017-07-21 LAB — CBC WITH DIFFERENTIAL/PLATELET
BASOS ABS: 0 10*3/uL (ref 0.0–0.1)
Basophils Relative: 0.5 % (ref 0.0–3.0)
EOS PCT: 1.5 % (ref 0.0–5.0)
Eosinophils Absolute: 0.1 10*3/uL (ref 0.0–0.7)
HCT: 32.7 % — ABNORMAL LOW (ref 36.0–46.0)
HEMOGLOBIN: 10.4 g/dL — AB (ref 12.0–15.0)
LYMPHS ABS: 1.4 10*3/uL (ref 0.7–4.0)
Lymphocytes Relative: 29.2 % (ref 12.0–46.0)
MCHC: 31.8 g/dL (ref 30.0–36.0)
MCV: 90.8 fl (ref 78.0–100.0)
MONO ABS: 0.5 10*3/uL (ref 0.1–1.0)
Monocytes Relative: 9.5 % (ref 3.0–12.0)
NEUTROS PCT: 59.3 % (ref 43.0–77.0)
Neutro Abs: 2.8 10*3/uL (ref 1.4–7.7)
Platelets: 295 10*3/uL (ref 150.0–400.0)
RBC: 3.6 Mil/uL — AB (ref 3.87–5.11)
RDW: 14.7 % (ref 11.5–15.5)
WBC: 4.7 10*3/uL (ref 4.0–10.5)

## 2017-07-21 LAB — MICROALBUMIN / CREATININE URINE RATIO
CREATININE, U: 217.2 mg/dL
MICROALB/CREAT RATIO: 40.1 mg/g — AB (ref 0.0–30.0)
Microalb, Ur: 87.2 mg/dL — ABNORMAL HIGH (ref 0.0–1.9)

## 2017-07-21 LAB — T4, FREE: Free T4: 0.81 ng/dL (ref 0.60–1.60)

## 2017-07-21 LAB — HEMOGLOBIN A1C: HEMOGLOBIN A1C: 6.1 % (ref 4.6–6.5)

## 2017-07-21 LAB — MAGNESIUM: Magnesium: 1.3 mg/dL — ABNORMAL LOW (ref 1.5–2.5)

## 2017-07-21 LAB — VITAMIN D 25 HYDROXY (VIT D DEFICIENCY, FRACTURES): VITD: 51.26 ng/mL (ref 30.00–100.00)

## 2017-07-21 LAB — TSH: TSH: 5.65 u[IU]/mL — ABNORMAL HIGH (ref 0.35–4.50)

## 2017-07-21 MED ORDER — AMLODIPINE BESYLATE 5 MG PO TABS: 5.0000 mg | ORAL_TABLET | Freq: Every day | ORAL | 1 refills | Status: DC

## 2017-07-21 MED ORDER — CLONIDINE HCL 0.2 MG PO TABS: 0.2000 mg | ORAL_TABLET | Freq: Three times a day (TID) | ORAL | 0 refills | Status: DC

## 2017-07-21 MED ORDER — FUROSEMIDE 40 MG PO TABS: 40.0000 mg | ORAL_TABLET | Freq: Two times a day (BID) | ORAL | 0 refills | Status: DC

## 2017-07-21 NOTE — Progress Notes (Addendum)
Chief Complaint  Patient presents with  . Establish Care   Establish care with son [name] 1. HTN uncontrolled on Clonidine 0.2 mg bid 0.3 tid made her drowsy, norvasc 5 mg bid, losartan 100 mg qd, hctz 25 mg qd, lasix 20 mg qd  2. Leg edema b/l worsening 3. She just had back surgery and was hospitalized in rehab from 02/24/17 to Novemeber 4163 with complications of wound dehis. And infection she was on IV abx and oral Abx and saw ID to follow up Dr. Cyndy Freeze did surgery in [place] and she recently had f/u and wound vac removed and also has a wound care RN coming to her home  4. DM 2 per son last A1C 6.5 on Metformin 500 mg bid will stop 2/2 CKD 4    Review of Systems  Constitutional: Positive for weight loss.       Down from 225 since long hosp stay and changed diet   HENT: Negative for hearing loss.   Respiratory: Negative for shortness of breath.   Cardiovascular: Positive for leg swelling. Negative for chest pain.  Gastrointestinal: Negative for abdominal pain.  Genitourinary: Positive for frequency.  Musculoskeletal: Positive for back pain. Negative for falls.  Skin:       +wound not healing lower back open  Neurological: Positive for sensory change. Negative for headaches.       +numbess in legs since sig. Leg edema  Walks with [facility] since back surgery   Psychiatric/Behavioral: Positive for memory loss.   Past Medical History:  Diagnosis Date  . Acute blood loss anemia 03/14/2017  . Acute renal failure superimposed on stage 3 chronic kidney disease ([place]) 03/14/2017  . AKI (acute kidney injury) ([place]) 02/28/2017  . Altered mental status 03/29/2017  . Anemia   . Arthritis   . Bilateral lower extremity edema 03/04/2017  . Chronic kidney disease    ?? renal insufficiency,   . CKD (chronic kidney disease) stage 3, GFR 30-59 ml/min ([facility]) 01/15/2017   ?? renal insufficiency, which she thinks is coming from "all these medications"  . Diabetes mellitus without complication ([place])    diagnosed 4-5  yrs ago  . Disease of pancreas   . Diverticulosis 01/15/2017  . DJD (degenerative joint disease)   . DM (diabetes mellitus), type 2 with renal complications ([place]) 8/45/3646  . Hypertension   . Hypothyroidism   . Hypothyroidism    ? etiology autoimmune or acquired   . Lethargy 02/28/2017  . Obesity, Class III, BMI 40-49.9 (morbid obesity) ([place]) 02/27/2017  . Postoperative wound infection 04/02/2017  . Spinal stenosis of lumbar region 01/15/2017  . Status post lumbar surgery 02/24/2017  . Wound healing, delayed    back   Past Surgical History:  Procedure Laterality Date  . ABDOMINAL EXPOSURE N/A 02/24/2017   Procedure: ABDOMINAL EXPOSURE;  Surgeon: Angelia Mould, MD;  Location: [facility];  Service: Vascular;  Laterality: N/A;  . ABDOMINAL HYSTERECTOMY  1987   no h/o abnormal paps   . ANTERIOR LAT LUMBAR FUSION N/A 02/24/2017   Procedure: Lumbar three- five Anterior lateral lumbar interbody fusion;  Surgeon: Ditty, Kevan Ny, MD;  Location: [facility];  Service: Neurosurgery;  Laterality: N/A;  L3-5 Anterior lateral lumbar interbody fusion with removal of coflex at L3-4, L4-5  . ANTERIOR LUMBAR FUSION N/A 02/24/2017   Procedure: Stage 1: Lumbar five-Sacral one Anterior lumbar interbody fusion;  Surgeon: Ditty, Kevan Ny, MD;  Location: [facility];  Service: Neurosurgery;  Laterality: N/A;  Stage 1:  L5-S1 Anterior lumbar interbody fusion  . APPLICATION OF ROBOTIC ASSISTANCE FOR SPINAL PROCEDURE N/A 02/26/2017   Procedure: APPLICATION OF ROBOTIC ASSISTANCE FOR SPINAL PROCEDURE;  Surgeon: Ditty, Kevan Ny, MD;  Location: [facility];  Service: Neurosurgery;  Laterality: N/A;  . APPLICATION OF WOUND VAC N/A 03/30/2017   Procedure: APPLICATION OF WOUND VAC;  Surgeon: Ditty, Kevan Ny, MD;  Location: [facility];  Service: Neurosurgery;  Laterality: N/A;  . BACK SURGERY  2013  . BREAST SURGERY  1992   Breast Reduction  . CHOLECYSTECTOMY  1982  . COLON SURGERY  2015  . COLON SURGERY    . EYE  SURGERY Right 2017   cataracts  . [place]  . LUMBAR FUSION  02/24/2017  . LUMBAR LAMINECTOMY WITH SPINOUS PROCESS PLATE 2 LEVEL N/A 1/61/0960   Procedure: LUMBAR LAMINECTOMY/DECOMPRESSION MICRODISCECTOMY CoFlex;  Surgeon: Faythe Ghee, MD;  Location: [facility];  Service: Neurosurgery;  Laterality: N/A;  Lumbar three-four,Lumbar Four-Five Laminectomy with Coflex  . LUMBAR WOUND DEBRIDEMENT N/A 03/30/2017   Procedure: Lumbar wound exploration/debridement, placement of wound vac;  Surgeon: Ditty, Kevan Ny, MD;  Location: [facility];  Service: Neurosurgery;  Laterality: N/A;  Lumbar wound exploration/debridement, placement of wound vac   Family History  Problem Relation Age of Onset  . Diabetes Father   . Heart attack Father   . Hypertension Father   . Heart disease Father   . Kidney disease Father   . Diabetes Mother   . Hypertension Mother   . Heart disease Mother   . Kidney disease Mother    Social History   Socioeconomic History  . Marital status: Widowed    Spouse name: Not on file  . Number of children: Not on file  . Years of education: Not on file  . Highest education level: Not on file  Social Needs  . Financial resource strain: Not on file  . Food insecurity - worry: Not on file  . Food insecurity - inability: Not on file  . Transportation needs - medical: Not on file  . Transportation needs - non-medical: Not on file  Occupational History  . Occupation: retired Economist work  Tobacco Use  . Smoking status: Never Smoker  . Smokeless tobacco: Never Used  Substance and Sexual Activity  . Alcohol use: No  . Drug use: No  . Sexual activity: No  Other Topics Concern  . Not on file  Social History Narrative   Admitted to [facility] 03/03/17   Widowed   Never smoked   Alcohol none   Full Code   12th grade education retired    Current Meds  Medication Sig  . acetaminophen (TYLENOL) 500 MG tablet Take 2 tablets (1,000 mg total) by  mouth 3 (three) times daily.  Marland Kitchen amLODipine (NORVASC) 5 MG tablet Take 1 tablet (5 mg total) by mouth daily.  Marland Kitchen atorvastatin (LIPITOR) 40 MG tablet Take 40 mg by mouth every evening.  . baclofen 5 MG TABS Take 5 mg by mouth 2 (two) times daily as needed for muscle spasms.  . cloNIDine (CATAPRES) 0.2 MG tablet Take 1 tablet (0.2 mg total) by mouth 3 (three) times daily. Take one tablet three times daily  . ergocalciferol (VITAMIN D2) 50000 UNITS capsule Take 50,000 Units by mouth once a week. On Tuesdays  . ferrous sulfate 325 (65 FE) MG tablet Take 325 mg by mouth daily with breakfast.  . furosemide (LASIX) 40 MG tablet Take 1 tablet (  40 mg total) by mouth 2 (two) times daily.  Marland Kitchen HYDROcodone-acetaminophen (NORCO/VICODIN) 5-325 MG tablet Take 1 tablet by mouth. Take one tablet 30 minutes before treatment  . levothyroxine (SYNTHROID, LEVOTHROID) 88 MCG tablet Take 88 mcg by mouth daily before breakfast.   . losartan (COZAAR) 100 MG tablet Take 100 mg by mouth daily after breakfast.   . Magnesium Oxide 250 MG TABS Take 250 mg by mouth daily.  . pantoprazole (PROTONIX) 40 MG tablet Take 1 tablet (40 mg total) by mouth at bedtime.  Marland Kitchen Specialty Vitamins Products (PROSTATE PO) Take by mouth. 30 ml twice daily to aid in wound healing  . vitamin C (ASCORBIC ACID) 500 MG tablet Take 500 mg by mouth. Take one tablet twice daily to aid wound healing  . [DISCONTINUED] amLODipine (NORVASC) 5 MG tablet Take 5 mg by mouth 2 (two) times daily.  . [DISCONTINUED] cloNIDine (CATAPRES) 0.3 MG tablet Take 0.2 mg by mouth 2 (two) times daily. Take one tablet three times daily   . [DISCONTINUED] furosemide (LASIX) 20 MG tablet Take 20 mg by mouth.  . [DISCONTINUED] hydrochlorothiazide (HYDRODIURIL) 25 MG tablet Take 25 mg by mouth daily.  . [DISCONTINUED] metFORMIN (GLUCOPHAGE) 500 MG tablet Take 500 mg by mouth 2 (two) times daily with a meal.   Allergies  Allergen Reactions  . Clams [Shellfish Allergy] Swelling and  Other (See Comments)    THROAT SWELLS NECK TURNS RED  . Hydralazine Other (See Comments)    CHEST TIGHTNESS  . Milk-Related Compounds Diarrhea   Recent Results (from the past 2160 hour(s))  C-reactive protein     Status: Abnormal   Collection Time: 05/07/17  9:17 AM  Result Value Ref Range   CRP 15.4 (H) <8.0 mg/L  Sedimentation rate     Status: Abnormal   Collection Time: 05/07/17  9:17 AM  Result Value Ref Range   Sed Rate 63 (H) 0 - 30 mm/h   Objective  Body mass index is 36.05 kg/m. Wt Readings from Last 3 Encounters:  07/21/17 197 lb 2 oz (89.4 kg)  07/02/17 185 lb (83.9 kg)  05/29/17 198 lb (89.8 kg)   Temp Readings from Last 3 Encounters:  07/21/17 97.7 F (36.5 C) (Oral)  07/02/17 97.8 F (36.6 C) (Oral)  05/29/17 (!) 97.3 F (36.3 C)   BP Readings from Last 3 Encounters:  07/21/17 (!) 158/78  07/02/17 (!) 173/72  05/29/17 (!) 143/60   Pulse Readings from Last 3 Encounters:  07/21/17 (!) 56  07/02/17 60  05/29/17 78   O2 sat room air 98%   Physical Exam  Constitutional: She is oriented to person, place, and time and well-developed, well-nourished, and in no distress.  HENT:  Head: Normocephalic and atraumatic.  Mouth/Throat: Oropharynx is clear and moist and mucous membranes are normal.  Eyes: Conjunctivae are normal. Pupils are equal, round, and reactive to light.  Cardiovascular: Regular rhythm and normal heart sounds. Bradycardia present.  No murmur heard. 2+ leg edema b/l   Pulmonary/Chest: Effort normal and breath sounds normal.  Abdominal: Soft. Bowel sounds are normal. There is no tenderness.  Neurological: She is alert and oriented to person, place, and time. Gait normal. Gait normal.  BL walks with cane   Skin: Skin is warm and dry.     Psychiatric: Mood, memory, affect and judgment normal.  Nursing note and vitals reviewed.   Assessment   1.HTN uncontrolled neg RAS 09/11/16  2. Leg edema  3. DM 2  4. CKD  4 last GFR 28 vs 3B 5.  Anemia  6. S/p lumbar back surgery with wound dehis. And infection slow to heal  7. Hypothyroidism  8. Hm   Plan  1.  Change clonidine 0.2 mg bid to tid  Reduce norvasc 5 mg bid to qd  D/c HCTZ 25 mg Lasix increase 40 mg bid x 1 week then 40 mg qd  Cont Losartan 100 mg qd  2. D/c hctz 25  Increase lasix as above  3. Labs today  Pt follows Dr. Marry Guan in [facility] and also sees specialist has not been lately 2/2 surgery  Get records eye doc she also sees another specialist in [place] cant think of name Foot exam in future  D/c metformin  May have to use insulin in future given CKD 4  On statin and ARB  4. Refer to Dr. Lynford Citizen renal [facility]  5. . Cbc and anemia panel  On iron 325 mg qd   6. Get records Dr. Cyndy Freeze or review in future  Wound is not healing likely needs more wound care  7.  Check thyroid labs  Clarified pt on 88 mcg of levothyroxine  Get records Dr. Ronnald Collum (vaccines, DEXA, labs, note was seeing him since 1992   8.  Pt unsure if wants flu shot today will disc at f/u  Also disc if had Tdap, pna 23, prevnar, shingrix   Check labs today CMET, CBC, A1C, lipid, Mag, vit D, protein, UA, TSH, T4, Hep B/C, anemia panel   S/p hysterectomy no h/o abnormal pap   Refer for 3D screening mammogram [facility]   Need to figure out colonoscopy   Need to figure out if had DEXA   More than 45 minutes spent with pt and son going over history and reviewing medications   Provider: Dr. Olivia Mackie [facility]

## 2017-07-21 NOTE — Patient Instructions (Addendum)
We will refer to Dr. Forest Becker Kid[facility] Doctor [place] Stop Metformin  Reduce Norvasc 5 mg qd  Stop Hydrochlorothiazide  Increase Lasix to 40 mg 2x per day x 1 week then 40 mg daily until follow up   Stay on current dose of Clonidine 0.2 but increase to 3x per day. Stop 0.3 dose Continue Losartan 100 mg daily  F/u in 1-2 weeks   Hypertension Hypertension, commonly called high blood pressure, is when the force of blood pumping through the arteries is too strong. The arteries are the blood vessels that carry blood from the heart throughout the body. Hypertension forces the heart to work harder to pump blood and may cause arteries to become narrow or stiff. Having untreated or uncontrolled hypertension can cause heart attacks, strokes, kidney disease, and other problems. A blood pressure reading consists of a higher number over a lower number. Ideally, your blood pressure should be below 120/80. The first ("top") number is called the systolic pressure. It is a measure of the pressure in your arteries as your heart beats. The second ("bottom") number is called the diastolic pressure. It is a measure of the pressure in your arteries as the heart relaxes. What are the causes? The cause of this condition is not known. What increases the risk? Some risk factors for high blood pressure are under your control. Others are not. Factors you can change  Smoking.  Having type 2 diabetes mellitus, high cholesterol, or both.  Not getting enough exercise or physical activity.  Being overweight.  Having too much fat, sugar, calories, or salt (sodium) in your diet.  Drinking too much alcohol. Factors that are difficult or impossible to change  Having chronic kidney disease.  Having a family history of high blood pressure.  Age. Risk increases with age.  Race. You may be at higher risk if you are African-American.  Gender. Men are at higher risk than women before age 49. After age 81,  women are at higher risk than men.  Having obstructive sleep apnea.  Stress. What are the signs or symptoms? Extremely high blood pressure (hypertensive crisis) may cause:  Headache.  Anxiety.  Shortness of breath.  Nosebleed.  Nausea and vomiting.  Severe chest pain.  Jerky movements you cannot control (seizures).  How is this diagnosed? This condition is diagnosed by measuring your blood pressure while you are seated, with your arm resting on a surface. The cuff of the blood pressure monitor will be placed directly against the skin of your upper arm at the level of your heart. It should be measured at least twice using the same arm. Certain conditions can cause a difference in blood pressure between your right and left arms. Certain factors can cause blood pressure readings to be lower or higher than normal (elevated) for a short period of time:  When your blood pressure is higher when you are in a health care provider's office than when you are at home, this is called white coat hypertension. Most people with this condition do not need medicines.  When your blood pressure is higher at home than when you are in a health care provider's office, this is called masked hypertension. Most people with this condition may need medicines to control blood pressure.  If you have a high blood pressure reading during one visit or you have normal blood pressure with other risk factors:  You may be asked to return on a different day to have your blood pressure checked again.  You may be asked to monitor your blood pressure at home for 1 week or longer.  If you are diagnosed with hypertension, you may have other blood or imaging tests to help your health care provider understand your overall risk for other conditions. How is this treated? This condition is treated by making healthy lifestyle changes, such as eating healthy foods, exercising more, and reducing your alcohol intake. Your health  care provider may prescribe medicine if lifestyle changes are not enough to get your blood pressure under control, and if:  Your systolic blood pressure is above 130.  Your diastolic blood pressure is above 80.  Your personal target blood pressure may vary depending on your medical conditions, your age, and other factors. Follow these instructions at home: Eating and drinking  Eat a diet that is high in fiber and potassium, and low in sodium, added sugar, and fat. An example eating plan is called the DASH (Dietary Approaches to Stop Hypertension) diet. To eat this way: ? Eat plenty of fresh fruits and vegetables. Try to fill half of your plate at each meal with fruits and vegetables. ? Eat whole grains, such as whole wheat pasta, brown rice, or whole grain bread. Fill about one quarter of your plate with whole grains. ? Eat or drink low-fat dairy products, such as skim milk or low-fat yogurt. ? Avoid fatty cuts of meat, processed or cured meats, and poultry with skin. Fill about one quarter of your plate with lean proteins, such as fish, chicken without skin, beans, eggs, and tofu. ? Avoid premade and processed foods. These tend to be higher in sodium, added sugar, and fat.  Reduce your daily sodium intake. Most people with hypertension should eat less than 1,500 mg of sodium a day.  Limit alcohol intake to no more than 1 drink a day for nonpregnant women and 2 drinks a day for men. One drink equals 12 oz of beer, 5 oz of wine, or 1 oz of hard liquor. Lifestyle  Work with your health care provider to maintain a healthy body weight or to lose weight. Ask what an ideal weight is for you.  Get at least 30 minutes of exercise that causes your heart to beat faster (aerobic exercise) most days of the week. Activities may include walking, swimming, or biking.  Include exercise to strengthen your muscles (resistance exercise), such as pilates or lifting weights, as part of your weekly exercise  routine. Try to do these types of exercises for 30 minutes at least 3 days a week.  Do not use any products that contain nicotine or tobacco, such as cigarettes and e-cigarettes. If you need help quitting, ask your health care provider.  Monitor your blood pressure at home as told by your health care provider.  Keep all follow-up visits as told by your health care provider. This is important. Medicines  Take over-the-counter and prescription medicines only as told by your health care provider. Follow directions carefully. Blood pressure medicines must be taken as prescribed.  Do not skip doses of blood pressure medicine. Doing this puts you at risk for problems and can make the medicine less effective.  Ask your health care provider about side effects or reactions to medicines that you should watch for. Contact a health care provider if:  You think you are having a reaction to a medicine you are taking.  You have headaches that keep coming back (recurring).  You feel dizzy.  You have swelling in your ankles.  You have trouble with your vision. Get help right away if:  You develop a severe headache or confusion.  You have unusual weakness or numbness.  You feel faint.  You have severe pain in your chest or abdomen.  You vomit repeatedly.  You have trouble breathing. Summary  Hypertension is when the force of blood pumping through your arteries is too strong. If this condition is not controlled, it may put you at risk for serious complications.  Your personal target blood pressure may vary depending on your medical conditions, your age, and other factors. For most people, a normal blood pressure is less than 120/80.  Hypertension is treated with lifestyle changes, medicines, or a combination of both. Lifestyle changes include weight loss, eating a healthy, low-sodium diet, exercising more, and limiting alcohol. This information is not intended to replace advice given to you  by your health care provider. Make sure you discuss any questions you have with your health care provider. Document Released: 07/21/2005 Document Revised: 06/18/2016 Document Reviewed: 06/18/2016 Elsevier Interactive Patient Education  2018 [employer].  Chronic Kidney Disease, Adult Chronic kidney disease (CKD) happens when the kidneys are damaged during a time of 3 or more months. The kidneys are two organs that do many important jobs in the body. These jobs include:  Removing wastes and extra fluids from the blood.  Making hormones that maintain the amount of fluid in your tissues and blood vessels.  Making sure that the body has the right amount of fluids and chemicals.  Most of the time, this condition does not go away, but it can usually be controlled. Steps must be taken to slow down the kidney damage or stop it from getting worse. Otherwise, the kidneys may stop working. Follow these instructions at home:  Follow your diet as told by your doctor. You may need to avoid alcohol, salty foods (sodium), and foods that are high in potassium, calcium, and protein.  Take over-the-counter and prescription medicines only as told by your doctor. Do not take any new medicines unless your doctor says you can do that. These include vitamins and minerals. ? Medicines and nutritional supplements can make kidney damage worse. ? Your doctor may need to change how much medicine you take.  Do not use any tobacco products. These include cigarettes, chewing tobacco, and e-cigarettes. If you need help quitting, ask your doctor.  Keep all follow-up visits as told by your doctor. This is important.  Check your blood pressure. Tell your doctor if there are changes to your blood pressure.  Get to a healthy weight. Stay at that weight. If you need help with this, ask your doctor.  Start or continue an exercise plan. Try to exercise at least 30 minutes a day, 5 days a week.  Stay up-to-date with your  shots (immunizations) as told by your doctor. Contact a doctor if:  Your symptoms get worse.  You have new symptoms. Get help right away if:  You have symptoms of end-stage kidney disease. These include: ? Headaches. ? Skin that is darker or lighter than normal. ? Numbness in your hands or feet. ? Easy bruising. ? Having hiccups often. ? Chest pain. ? Shortness of breath. ? Stopping of menstrual periods in women.  You have a fever.  You are making very little pee (urine).  You have pain or bleeding when you pee (urinate). This information is not intended to replace advice given to you by your health care provider. Make sure you discuss  any questions you have with your health care provider. Document Released: 10/15/2009 Document Revised: 12/27/2015 Document Reviewed: 03/19/2012 Elsevier Interactive Patient Education  2017 [employer].

## 2017-07-22 ENCOUNTER — Telehealth: Payer: Self-pay

## 2017-07-22 LAB — HEPATITIS B CORE ANTIBODY, TOTAL: Hep B Core Total Ab: NONREACTIVE

## 2017-07-22 LAB — HEPATITIS B SURFACE ANTIBODY, QUANTITATIVE

## 2017-07-22 LAB — IRON,TIBC AND FERRITIN PANEL
%SAT: 33 % (calc) (ref 11–50)
Ferritin: 270 ng/mL (ref 20–288)
IRON: 74 ug/dL (ref 45–160)
TIBC: 223 mcg/dL (calc) — ABNORMAL LOW (ref 250–450)

## 2017-07-22 LAB — HEPATITIS C ANTIBODY
Hepatitis C Ab: NONREACTIVE
SIGNAL TO CUT-OFF: 0.07 (ref <1.00)

## 2017-07-22 LAB — HEPATITIS B SURFACE ANTIGEN: Hepatitis B Surface Ag: NONREACTIVE

## 2017-07-22 NOTE — Telephone Encounter (Signed)
Copied from [place] [phone]. Topic: General - Other >> Jul 22, 2017 10:53 AM Carolyn Stare wrote:  Sharyn Lull from Dr Ronnald Collum office received a req for medical records .She would like to know what time frame you need. Can call her back [phone]

## 2017-07-23 ENCOUNTER — Other Ambulatory Visit: Payer: Self-pay | Admitting: Internal Medicine

## 2017-07-23 DIAGNOSIS — E039 Hypothyroidism, unspecified: Secondary | ICD-10-CM

## 2017-07-23 MED ORDER — LEVOTHYROXINE SODIUM 100 MCG PO TABS: 100.0000 ug | ORAL_TABLET | Freq: Every day | ORAL | 0 refills | Status: DC

## 2017-07-23 NOTE — Telephone Encounter (Signed)
W/in last 3 years   Thanks

## 2017-07-23 NOTE — Telephone Encounter (Signed)
Dr. Ronnald Collum office has been informed.

## 2017-08-06 ENCOUNTER — Encounter: Payer: Self-pay | Admitting: Internal Medicine

## 2017-08-06 ENCOUNTER — Ambulatory Visit (INDEPENDENT_AMBULATORY_CARE_PROVIDER_SITE_OTHER): Payer: Medicare Other | Admitting: Internal Medicine

## 2017-08-06 VITALS — BP 174/74 | HR 56 | Temp 97.4°F | Ht 62.0 in | Wt 199.0 lb

## 2017-08-06 DIAGNOSIS — N183 Chronic kidney disease, stage 3 unspecified: Secondary | ICD-10-CM

## 2017-08-06 DIAGNOSIS — Z1231 Encounter for screening mammogram for malignant neoplasm of breast: Secondary | ICD-10-CM | POA: Diagnosis not present

## 2017-08-06 DIAGNOSIS — E785 Hyperlipidemia, unspecified: Secondary | ICD-10-CM

## 2017-08-06 DIAGNOSIS — E1122 Type 2 diabetes mellitus with diabetic chronic kidney disease: Secondary | ICD-10-CM

## 2017-08-06 DIAGNOSIS — E559 Vitamin D deficiency, unspecified: Secondary | ICD-10-CM | POA: Diagnosis not present

## 2017-08-06 DIAGNOSIS — R609 Edema, unspecified: Secondary | ICD-10-CM | POA: Diagnosis not present

## 2017-08-06 DIAGNOSIS — I10 Essential (primary) hypertension: Secondary | ICD-10-CM

## 2017-08-06 DIAGNOSIS — E039 Hypothyroidism, unspecified: Secondary | ICD-10-CM

## 2017-08-06 DIAGNOSIS — R600 Localized edema: Secondary | ICD-10-CM

## 2017-08-06 DIAGNOSIS — D509 Iron deficiency anemia, unspecified: Secondary | ICD-10-CM

## 2017-08-06 DIAGNOSIS — K219 Gastro-esophageal reflux disease without esophagitis: Secondary | ICD-10-CM

## 2017-08-06 DIAGNOSIS — Z23 Encounter for immunization: Secondary | ICD-10-CM

## 2017-08-06 LAB — BASIC METABOLIC PANEL
BUN: 41 mg/dL — AB (ref 6–23)
CALCIUM: 9.6 mg/dL (ref 8.4–10.5)
CO2: 27 meq/L (ref 19–32)
Chloride: 104 mEq/L (ref 96–112)
Creatinine, Ser: 1.33 mg/dL — ABNORMAL HIGH (ref 0.40–1.20)
GFR: 50.1 mL/min — ABNORMAL LOW (ref >60.00)
GLUCOSE: 80 mg/dL (ref 70–99)
Potassium: 4.1 mEq/L (ref 3.5–5.1)
Sodium: 138 mEq/L (ref 135–145)

## 2017-08-06 MED ORDER — FERROUS SULFATE 325 (65 FE) MG PO TABS: 325.0000 mg | ORAL_TABLET | Freq: Every day | ORAL | 3 refills | Status: DC

## 2017-08-06 MED ORDER — CHOLECALCIFEROL 25 MCG (1000 UT) PO CAPS: 1000.0000 [IU] | ORAL_CAPSULE | Freq: Every day | ORAL | 3 refills | Status: DC

## 2017-08-06 MED ORDER — LOSARTAN POTASSIUM 100 MG PO TABS: 100.0000 mg | ORAL_TABLET | Freq: Every day | ORAL | 1 refills | Status: DC

## 2017-08-06 MED ORDER — ATORVASTATIN CALCIUM 40 MG PO TABS: 40.0000 mg | ORAL_TABLET | Freq: Every evening | ORAL | 1 refills | Status: DC

## 2017-08-06 MED ORDER — PANTOPRAZOLE SODIUM 40 MG PO TBEC: 40.0000 mg | DELAYED_RELEASE_TABLET | Freq: Every day | ORAL | 0 refills | Status: DC

## 2017-08-06 MED ORDER — MAGNESIUM OXIDE 400 MG PO CAPS: 400.0000 mg | ORAL_CAPSULE | Freq: Every day | ORAL | 1 refills | Status: DC

## 2017-08-06 MED ORDER — FUROSEMIDE 40 MG PO TABS: 40.0000 mg | ORAL_TABLET | Freq: Two times a day (BID) | ORAL | 1 refills | Status: DC

## 2017-08-06 MED ORDER — CLONIDINE HCL 0.2 MG PO TABS: 0.2000 mg | ORAL_TABLET | Freq: Three times a day (TID) | ORAL | 1 refills | Status: DC

## 2017-08-06 MED ORDER — NIFEDIPINE ER 30 MG PO TB24: 30.0000 mg | ORAL_TABLET | Freq: Every day | ORAL | 0 refills | Status: DC

## 2017-08-06 NOTE — Patient Instructions (Addendum)
Follow up in 3 weeks  Try to take blood pressure medications 2 hours before next visit  Log your blood pressure and bring in with me  Wear compression stockings  You had the flu shot today    Edema Edema is when you have too much fluid in your body or under your skin. Edema may make your legs, feet, and ankles swell up. Swelling is also common in looser tissues, like around your eyes. This is a common condition. It gets more common as you get older. There are many possible causes of edema. Eating too much salt (sodium) and being on your feet or sitting for a long time can cause edema in your legs, feet, and ankles. Hot weather may make edema worse. Edema is usually painless. Your skin may look swollen or shiny. Follow these instructions at home:  Keep the swollen body part raised (elevated) above the level of your heart when you are sitting or lying down.  Do not sit still or stand for a long time.  Do not wear tight clothes. Do not wear garters on your upper legs.  Exercise your legs. This can help the swelling go down.  Wear elastic bandages or support stockings as told by your doctor.  Eat a low-salt (low-sodium) diet to reduce fluid as told by your doctor.  Depending on the cause of your swelling, you may need to limit how much fluid you drink (fluid restriction).  Take over-the-counter and prescription medicines only as told by your doctor. Contact a doctor if:  Treatment is not working.  You have heart, liver, or kidney disease and have symptoms of edema.  You have sudden and unexplained weight gain. Get help right away if:  You have shortness of breath or chest pain.  You cannot breathe when you lie down.  You have pain, redness, or warmth in the swollen areas.  You have heart, liver, or kidney disease and get edema all of a sudden.  You have a fever and your symptoms get worse all of a sudden. Summary  Edema is when you have too much fluid in your body or under  your skin.  Edema may make your legs, feet, and ankles swell up. Swelling is also common in looser tissues, like around your eyes.  Raise (elevate) the swollen body part above the level of your heart when you are sitting or lying down.  Follow your doctor's instructions about diet and how much fluid you can drink (fluid restriction). This information is not intended to replace advice given to you by your health care provider. Make sure you discuss any questions you have with your health care provider. Document Released: 01/07/2008 Document Revised: 08/08/2016 Document Reviewed: 08/08/2016 Elsevier Interactive Patient Education  2017 [employer]  Paresthesia Paresthesia is a burning or prickling feeling. This feeling can happen in any part of the body. It often happens in the hands, arms, legs, or feet. Usually, it is not painful. In most cases, the feeling goes away in a short time and is not a sign of a serious problem. Follow these instructions at home:  Avoid drinking alcohol.  Try massage or needle therapy (acupuncture) to help with your problems.  Keep all follow-up visits as told by your doctor. This is important. Contact a doctor if:  You keep on having episodes of paresthesia.  Your burning or prickling feeling gets worse when you walk.  You have pain or cramps.  You feel dizzy.  You have a rash.  Get help right away if:  You feel weak.  You have trouble walking or moving.  You have problems speaking, understanding, or seeing.  You feel confused.  You cannot control when you pee (urinate) or poop (bowel movement).  You lose feeling (numbness) after an injury.  You pass out (faint). This information is not intended to replace advice given to you by your health care provider. Make sure you discuss any questions you have with your health care provider. Document Released: 07/03/2008 Document Revised: 12/27/2015 Document Reviewed: 07/17/2014 Elsevier Interactive  Patient Education  2018 [employer].  Hypertension Hypertension, commonly called high blood pressure, is when the force of blood pumping through the arteries is too strong. The arteries are the blood vessels that carry blood from the heart throughout the body. Hypertension forces the heart to work harder to pump blood and may cause arteries to become narrow or stiff. Having untreated or uncontrolled hypertension can cause heart attacks, strokes, kidney disease, and other problems. A blood pressure reading consists of a higher number over a lower number. Ideally, your blood pressure should be below 120/80. The first ("top") number is called the systolic pressure. It is a measure of the pressure in your arteries as your heart beats. The second ("bottom") number is called the diastolic pressure. It is a measure of the pressure in your arteries as the heart relaxes. What are the causes? The cause of this condition is not known. What increases the risk? Some risk factors for high blood pressure are under your control. Others are not. Factors you can change  Smoking.  Having type 2 diabetes mellitus, high cholesterol, or both.  Not getting enough exercise or physical activity.  Being overweight.  Having too much fat, sugar, calories, or salt (sodium) in your diet.  Drinking too much alcohol. Factors that are difficult or impossible to change  Having chronic kidney disease.  Having a family history of high blood pressure.  Age. Risk increases with age.  Race. You may be at higher risk if you are African-American.  Gender. Men are at higher risk than women before age 75. After age 82, women are at higher risk than men.  Having obstructive sleep apnea.  Stress. What are the signs or symptoms? Extremely high blood pressure (hypertensive crisis) may cause:  Headache.  Anxiety.  Shortness of breath.  Nosebleed.  Nausea and vomiting.  Severe chest pain.  Jerky movements you  cannot control (seizures).  How is this diagnosed? This condition is diagnosed by measuring your blood pressure while you are seated, with your arm resting on a surface. The cuff of the blood pressure monitor will be placed directly against the skin of your upper arm at the level of your heart. It should be measured at least twice using the same arm. Certain conditions can cause a difference in blood pressure between your right and left arms. Certain factors can cause blood pressure readings to be lower or higher than normal (elevated) for a short period of time:  When your blood pressure is higher when you are in a health care provider's office than when you are at home, this is called white coat hypertension. Most people with this condition do not need medicines.  When your blood pressure is higher at home than when you are in a health care provider's office, this is called masked hypertension. Most people with this condition may need medicines to control blood pressure.  If you have a high blood pressure reading during  one visit or you have normal blood pressure with other risk factors:  You may be asked to return on a different day to have your blood pressure checked again.  You may be asked to monitor your blood pressure at home for 1 week or longer.  If you are diagnosed with hypertension, you may have other blood or imaging tests to help your health care provider understand your overall risk for other conditions. How is this treated? This condition is treated by making healthy lifestyle changes, such as eating healthy foods, exercising more, and reducing your alcohol intake. Your health care provider may prescribe medicine if lifestyle changes are not enough to get your blood pressure under control, and if:  Your systolic blood pressure is above 130.  Your diastolic blood pressure is above 80.  Your personal target blood pressure may vary depending on your medical conditions, your age,  and other factors. Follow these instructions at home: Eating and drinking  Eat a diet that is high in fiber and potassium, and low in sodium, added sugar, and fat. An example eating plan is called the DASH (Dietary Approaches to Stop Hypertension) diet. To eat this way: ? Eat plenty of fresh fruits and vegetables. Try to fill half of your plate at each meal with fruits and vegetables. ? Eat whole grains, such as whole wheat pasta, brown rice, or whole grain bread. Fill about one quarter of your plate with whole grains. ? Eat or drink low-fat dairy products, such as skim milk or low-fat yogurt. ? Avoid fatty cuts of meat, processed or cured meats, and poultry with skin. Fill about one quarter of your plate with lean proteins, such as fish, chicken without skin, beans, eggs, and tofu. ? Avoid premade and processed foods. These tend to be higher in sodium, added sugar, and fat.  Reduce your daily sodium intake. Most people with hypertension should eat less than 1,500 mg of sodium a day.  Limit alcohol intake to no more than 1 drink a day for nonpregnant women and 2 drinks a day for men. One drink equals 12 oz of beer, 5 oz of wine, or 1 oz of hard liquor. Lifestyle  Work with your health care provider to maintain a healthy body weight or to lose weight. Ask what an ideal weight is for you.  Get at least 30 minutes of exercise that causes your heart to beat faster (aerobic exercise) most days of the week. Activities may include walking, swimming, or biking.  Include exercise to strengthen your muscles (resistance exercise), such as pilates or lifting weights, as part of your weekly exercise routine. Try to do these types of exercises for 30 minutes at least 3 days a week.  Do not use any products that contain nicotine or tobacco, such as cigarettes and e-cigarettes. If you need help quitting, ask your health care provider.  Monitor your blood pressure at home as told by your health care  provider.  Keep all follow-up visits as told by your health care provider. This is important. Medicines  Take over-the-counter and prescription medicines only as told by your health care provider. Follow directions carefully. Blood pressure medicines must be taken as prescribed.  Do not skip doses of blood pressure medicine. Doing this puts you at risk for problems and can make the medicine less effective.  Ask your health care provider about side effects or reactions to medicines that you should watch for. Contact a health care provider if:  You think you  are having a reaction to a medicine you are taking.  You have headaches that keep coming back (recurring).  You feel dizzy.  You have swelling in your ankles.  You have trouble with your vision. Get help right away if:  You develop a severe headache or confusion.  You have unusual weakness or numbness.  You feel faint.  You have severe pain in your chest or abdomen.  You vomit repeatedly.  You have trouble breathing. Summary  Hypertension is when the force of blood pumping through your arteries is too strong. If this condition is not controlled, it may put you at risk for serious complications.  Your personal target blood pressure may vary depending on your medical conditions, your age, and other factors. For most people, a normal blood pressure is less than 120/80.  Hypertension is treated with lifestyle changes, medicines, or a combination of both. Lifestyle changes include weight loss, eating a healthy, low-sodium diet, exercising more, and limiting alcohol. This information is not intended to replace advice given to you by your health care provider. Make sure you discuss any questions you have with your health care provider. Document Released: 07/21/2005 Document Revised: 06/18/2016 Document Reviewed: 06/18/2016 Elsevier Interactive Patient Education  [employer].

## 2017-08-07 ENCOUNTER — Telehealth: Payer: Self-pay | Admitting: *Deleted

## 2017-08-07 ENCOUNTER — Other Ambulatory Visit: Payer: Self-pay | Admitting: Internal Medicine

## 2017-08-07 NOTE — Telephone Encounter (Signed)
Copied from [place] [phone]. Topic: General - Other >> Aug 07, 2017 10:45 AM Yvette Rack wrote: Reason for CRM: patient calling stating that the pharmacy told her to call us to find out if she should take cloNIDine (CATAPRES) 0.2 MG tablet and NIFEdipine (PROCARDIA-XL/ADALAT CC) 30 MG 24 hr tablet together because the pharmacy states that it wasn't a good idea to take both at the same time

## 2017-08-07 NOTE — Telephone Encounter (Signed)
Called pharmacy and called pt  Will use with caution DDI clonidine and procardia is hypotension and bradycardia will monitor for sx's  Advised pt to dc norvasc again and reviewed all meds for BP again and how should be taking   Taylor Tyler

## 2017-08-09 ENCOUNTER — Encounter: Payer: Self-pay | Admitting: Internal Medicine

## 2017-08-09 NOTE — Progress Notes (Signed)
Chief Complaint  Patient presents with  . Follow-up   F/u  1. HTN uncontrolled she is getting 160s/50s-60s at home on Norvasc 5, Clonidine 0.2 tid, Lasix 40 bid, Losartan 100 mg qd  2. She c/o leg edema b/l she has compression stockings at home but not wearing.  3. S/p lumbar back surgery c/o numbness/tingling in both legs. Advised to disc with back surgeon appt 08/12/17 this could be related to back surgery. She also recently was cleared by ID after back surgery site became infected for some time and she was on antibiotics and having wound care at home. She shows a photo today and wound is healing appropriately.   4. CKD 3 and HTN appt with renal 08/27/17 to help manage CKD and help control HTN and edema.    Review of Systems  Constitutional: Negative for weight loss.  HENT: Negative for hearing loss.   Eyes:       No vision problems  Respiratory: Negative for shortness of breath.   Cardiovascular: Positive for leg swelling. Negative for chest pain.  Gastrointestinal: Negative for abdominal pain.  Musculoskeletal: Negative for back pain.  Skin:       Wound healing low back   Neurological: Positive for sensory change.  Psychiatric/Behavioral: Negative for depression.   Past Medical History:  Diagnosis Date  . Acute blood loss anemia 03/14/2017  . Acute renal failure superimposed on stage 3 chronic kidney disease ([place]) 03/14/2017  . AKI (acute kidney injury) ([place]) 02/28/2017  . Altered mental status 03/29/2017  . Anemia   . Aortic atherosclerosis ([place])   . Arthritis   . Bilateral lower extremity edema 03/04/2017  . Chronic kidney disease    ?? renal insufficiency,   . CKD (chronic kidney disease) stage 3, GFR 30-59 ml/min (H[facility]) 01/15/2017   ?? renal insufficiency, which she thinks is coming from "all these medications"  . Diabetes mellitus without complication ([place])    diagnosed 4-5 yrs ago  . Disease of pancreas   . Diverticulitis    s/p perforation and partial colectomy 01/27/14 with  3 benign lymph nodes   . Diverticulosis 01/15/2017  . DJD (degenerative joint disease)   . DM (diabetes mellitus), type 2 with renal complications ([place]) 04/01/5620  . Fatty liver   . Hypertension   . Hypothyroidism   . Hypothyroidism    ? etiology autoimmune or acquired   . Lethargy 02/28/2017  . Obesity, Class III, BMI 40-49.9 (morbid obesity) ([place]) 02/27/2017  . Pleural lipoma   . Postoperative wound infection 04/02/2017  . Spinal stenosis of lumbar region 01/15/2017  . Status post lumbar surgery 02/24/2017  . Ventral hernia   . Wound healing, delayed    back   Past Surgical History:  Procedure Laterality Date  . ABDOMINAL EXPOSURE N/A 02/24/2017   Procedure: ABDOMINAL EXPOSURE;  Surgeon: Angelia Mould, MD;  Location: [facility];  Service: Vascular;  Laterality: N/A;  . ABDOMINAL HYSTERECTOMY  1987   no h/o abnormal paps   . ANTERIOR LAT LUMBAR FUSION N/A 02/24/2017   Procedure: Lumbar three- five Anterior lateral lumbar interbody fusion;  Surgeon: Ditty, Kevan Ny, MD;  Location: [facility];  Service: Neurosurgery;  Laterality: N/A;  L3-5 Anterior lateral lumbar interbody fusion with removal of coflex at L3-4, L4-5  . ANTERIOR LUMBAR FUSION N/A 02/24/2017   Procedure: Stage 1: Lumbar five-Sacral one Anterior lumbar interbody fusion;  Surgeon: Ditty, Kevan Ny, MD;  Location: [facility];  Service: Neurosurgery;  Laterality: N/A;  Stage 1:  L5-S1 Anterior lumbar interbody fusion  . APPLICATION OF ROBOTIC ASSISTANCE FOR SPINAL PROCEDURE N/A 02/26/2017   Procedure: APPLICATION OF ROBOTIC ASSISTANCE FOR SPINAL PROCEDURE;  Surgeon: Ditty, Kevan Ny, MD;  Location: [employer];  Service: Neurosurgery;  Laterality: N/A;  . APPLICATION OF WOUND VAC N/A 03/30/2017   Procedure: APPLICATION OF WOUND VAC;  Surgeon: Ditty, Kevan Ny, MD;  Location: [facility];  Service: Neurosurgery;  Laterality: N/A;  . BACK SURGERY  2013  . BREAST SURGERY  1992   Breast Reduction  . CHOLECYSTECTOMY  1982  . COLON  SURGERY  2015  . COLON SURGERY    . EYE SURGERY Right 2017   cataracts  . [place]  . LUMBAR FUSION  02/24/2017  . LUMBAR LAMINECTOMY WITH SPINOUS PROCESS PLATE 2 LEVEL N/A 0/17/5102   Procedure: LUMBAR LAMINECTOMY/DECOMPRESSION MICRODISCECTOMY CoFlex;  Surgeon: Faythe Ghee, MD;  Location: [facility];  Service: Neurosurgery;  Laterality: N/A;  Lumbar three-four,Lumbar Four-Five Laminectomy with Coflex  . LUMBAR WOUND DEBRIDEMENT N/A 03/30/2017   Procedure: Lumbar wound exploration/debridement, placement of wound vac;  Surgeon: Ditty, Kevan Ny, MD;  Location: [facility];  Service: Neurosurgery;  Laterality: N/A;  Lumbar wound exploration/debridement, placement of wound vac  . PARTIAL COLECTOMY     01/27/14 diverticulitis and 3 benign lymph nodes [facility] Dr. Pat Patrick   . VENTRAL HERNIA REPAIR     Family History  Problem Relation Age of Onset  . Diabetes Father   . Heart attack Father   . Hypertension Father   . Heart disease Father   . Kidney disease Father   . Diabetes Mother   . Hypertension Mother   . Heart disease Mother   . Kidney disease Mother    Social History   Socioeconomic History  . Marital status: Widowed    Spouse name: Not on file  . Number of children: Not on file  . Years of education: Not on file  . Highest education level: Not on file  Social Needs  . Financial resource strain: Not on file  . Food insecurity - worry: Not on file  . Food insecurity - inability: Not on file  . Transportation needs - medical: Not on file  . Transportation needs - non-medical: Not on file  Occupational History  . Occupation: retired Economist work  Tobacco Use  . Smoking status: Never Smoker  . Smokeless tobacco: Never Used  Substance and Sexual Activity  . Alcohol use: No  . Drug use: No  . Sexual activity: No  Other Topics Concern  . Not on file  Social History Narrative   Admitted to [facility] 03/03/17   Widowed since 2012    2 sons [name]  comes to appts)    Never smoked   Alcohol none   Full Code   12th grade education retired    Current Meds  Medication Sig  . cloNIDine (CATAPRES) 0.2 MG tablet Take 1 tablet (0.2 mg total) by mouth 3 (three) times daily. Take one tablet three times daily  . furosemide (LASIX) 40 MG tablet Take 1 tablet (40 mg total) by mouth 2 (two) times daily.  Marland Kitchen HYDROcodone-acetaminophen (NORCO/VICODIN) 5-325 MG tablet Take 1 tablet by mouth. Take one tablet 30 minutes before treatment  . levothyroxine (SYNTHROID, LEVOTHROID) 100 MCG tablet Take 1 tablet (100 mcg total) by mouth daily before breakfast.  . losartan (COZAAR) 100 MG tablet Take 1 tablet (100 mg total) by mouth daily after breakfast.  .  pantoprazole (PROTONIX) 40 MG tablet Take 1 tablet (40 mg total) by mouth at bedtime.  . vitamin C (ASCORBIC ACID) 500 MG tablet Take 500 mg by mouth. Take one tablet twice daily to aid wound healing  . [DISCONTINUED] amLODipine (NORVASC) 5 MG tablet Take 1 tablet (5 mg total) by mouth daily.  . [DISCONTINUED] cloNIDine (CATAPRES) 0.2 MG tablet Take 1 tablet (0.2 mg total) by mouth 3 (three) times daily. Take one tablet three times daily  . [DISCONTINUED] furosemide (LASIX) 40 MG tablet Take 1 tablet (40 mg total) by mouth 2 (two) times daily.  . [DISCONTINUED] losartan (COZAAR) 100 MG tablet Take 100 mg by mouth daily after breakfast.   . [DISCONTINUED] Magnesium Oxide 250 MG TABS Take 250 mg by mouth daily.  . [DISCONTINUED] pantoprazole (PROTONIX) 40 MG tablet Take 1 tablet (40 mg total) by mouth at bedtime.   Allergies  Allergen Reactions  . Clams [Shellfish Allergy] Swelling and Other (See Comments)    THROAT SWELLS NECK TURNS RED  . Hydralazine Other (See Comments)    CHEST TIGHTNESS  . Milk-Related Compounds Diarrhea   Recent Results (from the past 2160 hour(s))  Comprehensive metabolic panel     Status: Abnormal   Collection Time: 07/21/17 10:01 AM  Result Value Ref Range   Sodium 138 135 -  145 mEq/L   Potassium 4.2 3.5 - 5.1 mEq/L   Chloride 107 96 - 112 mEq/L   CO2 25 19 - 32 mEq/L   Glucose, Bld 104 (H) 70 - 99 mg/dL   BUN 24 (H) 6 - 23 mg/dL   Creatinine, Ser 1.46 (H) 0.40 - 1.20 mg/dL   Total Bilirubin 0.4 0.2 - 1.2 mg/dL   Alkaline Phosphatase 42 39 - 117 U/L   AST 19 0 - 37 U/L   ALT 10 0 - 35 U/L   Total Protein 7.6 6.0 - 8.3 g/dL   Albumin 3.5 3.5 - 5.2 g/dL   Calcium 9.3 8.4 - 10.5 mg/dL   GFR 45.00 (L) >60.00 mL/min  CBC with Differential/Platelet     Status: Abnormal   Collection Time: 07/21/17 10:01 AM  Result Value Ref Range   WBC 4.7 4.0 - 10.5 K/uL   RBC 3.60 (L) 3.87 - 5.11 Mil/uL   Hemoglobin 10.4 (L) 12.0 - 15.0 g/dL   HCT 32.7 (L) 36.0 - 46.0 %   MCV 90.8 78.0 - 100.0 fl   MCHC 31.8 30.0 - 36.0 g/dL   RDW 14.7 11.5 - 15.5 %   Platelets 295.0 150.0 - 400.0 K/uL   Neutrophils Relative % 59.3 43.0 - 77.0 %   Lymphocytes Relative 29.2 12.0 - 46.0 %   Monocytes Relative 9.5 3.0 - 12.0 %   Eosinophils Relative 1.5 0.0 - 5.0 %   Basophils Relative 0.5 0.0 - 3.0 %   Neutro Abs 2.8 1.4 - 7.7 K/uL   Lymphs Abs 1.4 0.7 - 4.0 K/uL   Monocytes Absolute 0.5 0.1 - 1.0 K/uL   Eosinophils Absolute 0.1 0.0 - 0.7 K/uL   Basophils Absolute 0.0 0.0 - 0.1 K/uL  Lipid panel     Status: Abnormal   Collection Time: 07/21/17 10:01 AM  Result Value Ref Range   Cholesterol 194 0 - 200 mg/dL    Comment: ATP III Classification       Desirable:  < 200 mg/dL               Borderline High:  200 - 239 mg/dL  High:  > = 240 mg/dL   Triglycerides 172.0 (H) 0.0 - 149.0 mg/dL    Comment: Normal:  <150 mg/dLBorderline High:  150 - 199 mg/dL   HDL 51.20 >39.00 mg/dL   VLDL 34.4 0.0 - 40.0 mg/dL   LDL Cholesterol 109 (H) 0 - 99 mg/dL   Total CHOL/HDL Ratio 4     Comment:                Men          Women1/2 Average Risk     3.4          3.3Average Risk          5.0          4.42X Average Risk          9.6          7.13X Average Risk          15.0          11.0                        NonHDL 142.94     Comment: NOTE:  Non-HDL goal should be 30 mg/dL higher than patient's LDL goal (i.e. LDL goal of < 70 mg/dL, would have non-HDL goal of < 100 mg/dL)  T4, free     Status: None   Collection Time: 07/21/17 10:01 AM  Result Value Ref Range   Free T4 0.81 0.60 - 1.60 ng/dL    Comment: Specimens from patients who are undergoing biotin therapy and /or ingesting biotin supplements may contain high levels of biotin.  The higher biotin concentration in these specimens interferes with this Free T4 assay.  Specimens that contain high levels  of biotin may cause false high results for this Free T4 assay.  Please interpret results in light of the total clinical presentation of the patient.    TSH     Status: Abnormal   Collection Time: 07/21/17 10:01 AM  Result Value Ref Range   TSH 5.65 (H) 0.35 - 4.50 uIU/mL  Urinalysis, Routine w reflex microscopic     Status: Abnormal   Collection Time: 07/21/17 10:01 AM  Result Value Ref Range   Color, Urine YELLOW Yellow;Lt. Yellow   APPearance Sl Cloudy (A) Clear   Specific Gravity, Urine >=1.030 (A) 1.000 - 1.030   pH 5.5 5.0 - 8.0   Total Protein, Urine 100 (A) Negative   Urine Glucose NEGATIVE Negative   Ketones, ur NEGATIVE Negative   Bilirubin Urine NEGATIVE Negative   Hgb urine dipstick NEGATIVE Negative   Urobilinogen, UA 0.2 0.0 - 1.0   Leukocytes, UA NEGATIVE Negative   Nitrite NEGATIVE Negative   WBC, UA 21-50/hpf (A) 0-2/hpf   RBC / HPF none seen 0-2/hpf   Squamous Epithelial / LPF Few(5-10/hpf) (A) Rare(0-4/hpf)   Bacteria, UA Rare(<10/hpf) (A) None   Hyaline Casts, UA Presence of (A) None  Urine Microalbumin w/creat. ratio     Status: Abnormal   Collection Time: 07/21/17 10:01 AM  Result Value Ref Range   Microalb, Ur 87.2 (H) 0.0 - 1.9 mg/dL   Creatinine,U 217.2 mg/dL   Microalb Creat Ratio 40.1 (H) 0.0 - 30.0 mg/g  Hemoglobin A1c     Status: None   Collection Time: 07/21/17 10:01 AM  Result Value Ref  Range   Hgb A1c MFr Bld 6.1 4.6 - 6.5 %    Comment: Glycemic Control Guidelines for People with  Diabetes:Non Diabetic:  <6%Goal of Therapy: <7%Additional Action Suggested:  >8%   Vitamin D (25 hydroxy)     Status: None   Collection Time: 07/21/17 10:01 AM  Result Value Ref Range   VITD 51.26 30.00 - 100.00 ng/mL  Magnesium     Status: Abnormal   Collection Time: 07/21/17 10:01 AM  Result Value Ref Range   Magnesium 1.3 (L) 1.5 - 2.5 mg/dL  Hepatitis B surface antibody     Status: Abnormal   Collection Time: 07/21/17 10:01 AM  Result Value Ref Range   Hepatitis B-Post <5 (L) > OR = 10 mIU/mL    Comment: . Patient does not have immunity to hepatitis B virus. . For additional information, please refer to http://education.questdiagnostics.com/faq/FAQ105 (This link is being provided for informational/ educational purposes only).   Hepatitis C antibody     Status: None   Collection Time: 07/21/17 10:01 AM  Result Value Ref Range   Hepatitis C Ab NON-REACTIVE NON-REACTI   SIGNAL TO CUT-OFF 0.07 <1.00  Hepatitis B surface antigen     Status: None   Collection Time: 07/21/17 10:01 AM  Result Value Ref Range   Hepatitis B Surface Ag NON-REACTIVE NON-REACTI  Hepatitis B core antibody, total     Status: None   Collection Time: 07/21/17 10:01 AM  Result Value Ref Range   Hep B Core Total Ab NON-REACTIVE NON-REACTI  Iron, TIBC and Ferritin Panel     Status: Abnormal   Collection Time: 07/21/17 10:01 AM  Result Value Ref Range   Iron 74 45 - 160 mcg/dL   TIBC 223 (L) 250 - 450 mcg/dL (calc)   %SAT 33 11 - 50 % (calc)   Ferritin 270 20 - 288 ng/mL  Basic Metabolic Panel (BMET)     Status: Abnormal   Collection Time: 08/06/17 10:16 AM  Result Value Ref Range   Sodium 138 135 - 145 mEq/L   Potassium 4.1 3.5 - 5.1 mEq/L   Chloride 104 96 - 112 mEq/L   CO2 27 19 - 32 mEq/L   Glucose, Bld 80 70 - 99 mg/dL   BUN 41 (H) 6 - 23 mg/dL   Creatinine, Ser 1.33 (H) 0.40 - 1.20 mg/dL   Calcium  9.6 8.4 - 10.5 mg/dL   GFR 50.10 (L) >60.00 mL/min   Objective  Body mass index is 36.4 kg/m. Wt Readings from Last 3 Encounters:  08/06/17 199 lb (90.3 kg)  07/21/17 197 lb 2 oz (89.4 kg)  07/02/17 185 lb (83.9 kg)   Temp Readings from Last 3 Encounters:  08/06/17 (!) 97.4 F (36.3 C) (Oral)  07/21/17 97.7 F (36.5 C) (Oral)  07/02/17 97.8 F (36.6 C) (Oral)   BP Readings from Last 3 Encounters:  08/06/17 (!) 174/74  07/21/17 (!) 158/78  07/02/17 (!) 173/72   Pulse Readings from Last 3 Encounters:  08/06/17 (!) 56  07/21/17 (!) 56  07/02/17 60   O2 sat room air 97%  Physical Exam  Constitutional: She is oriented to person, place, and time and well-developed, well-nourished, and in no distress.  HENT:  Head: Normocephalic and atraumatic.  Mouth/Throat: Oropharynx is clear and moist and mucous membranes are normal.  Eyes: Conjunctivae are normal. Pupils are equal, round, and reactive to light.  Cardiovascular: Regular rhythm and normal heart sounds.  No murmur heard. 2+ leg edema b/l   Pulmonary/Chest: Effort normal and breath sounds normal.  Abdominal: Soft. Bowel sounds are normal. There is no tenderness.  Neurological: She  is alert and oriented to person, place, and time. Gait normal.  Slowed gait  Skin: Skin is warm and dry.  Healing back wound   Psychiatric: Mood, memory, affect and judgment normal.  Nursing note and vitals reviewed.   Assessment   1. HTN/HLD 2. CKD  With proteinuria  3. Leg edema  4. Parathesia (numbness/tingling b/l legs) s/p lumbar back surgery  5. Hypothyroidism  6. HM 7. H/o DM 2 last A1C 6.1 w/o meds 07/21/17  8. H/o hypomagnesemia  9. Vit D def   Plan  1.  D/c Norvasc 5 and change to Nifedipine XL 30 reviewed with pt and pharmacist so this is clear  Cont Clonidine 0.2 tid  Lasix 40 mg bid Losartan 100 mg qd  Cont statin   2.  Pending renal appt [facility] renal 08/27/17 need help with BP control, CKD management and edema  control  Consider w/u SPEP/UPEP, ANA, renal US in future as kidney function had rapid decline per review of Creatinine trend   3. Cont lasix 40 mg bid check BMET today  rec pt wear compression stockings she does not want Rx for new 2/2 cost   4. Will CC note to surgeon to see if he/she thinks could be postsurgical related   5. Cont inc. Dose levothyroxine from 88 to 100 need to repeat TSH in future   6.  Given flu shot today  Need to check status of Tdap, pna 23, prevnar, shingrix with Dr. Ronnald Collum records   mammo ordered will change [facility] to [facility] as [facility] only [employer] location does 3D mammos Colonoscopy had 2015 Dr. Pat Patrick [facility] need to get records Out of age window pap  DEXA no record in epic still pending Dr. Ronnald Collum records.   Neg hep C. rec hep B vaccine not immune   7.  Hold metformin for now if needed consider Januvia low dose in future monitor A1C  Will ask at f/u last eye exam  Will do foot exam in future   8. Increase magnesium form 250 to 400 mg qd  9. Reduce dose from 50K weekly to 1000 mg qd vit D normal   Provider: Dr. Olivia Mackie McLean-Scocuzza-[facility] Medicine

## 2017-08-20 ENCOUNTER — Ambulatory Visit: Payer: Self-pay | Admitting: Internal Medicine

## 2017-08-20 LAB — HM MAMMOGRAPHY

## 2017-08-25 ENCOUNTER — Telehealth: Payer: Self-pay | Admitting: Internal Medicine

## 2017-08-25 NOTE — Telephone Encounter (Signed)
Please advise 

## 2017-08-25 NOTE — Telephone Encounter (Signed)
Copied from [place]. Topic: Quick Communication - Rx Refill/Question >> Aug 25, 2017  1:51 PM Scherrie Gerlach wrote: Medication: new Blood glucose machine One touch ulta meter , lancets and test strips  [employer] called for pt to asked that a new Rx for this be sent to  Preferred Pharmacy (with phone number or street name): [id number] - [place], [place] [phone] (Phone) [phone] (Fax) Pt old machine broke and if pt can get a written Rx, it will be covered

## 2017-08-25 NOTE — Telephone Encounter (Signed)
Request for new glucose monitoring supplies.

## 2017-08-25 NOTE — Telephone Encounter (Signed)
Can I send in script ? Test once a day ?

## 2017-08-28 NOTE — Telephone Encounter (Signed)
Ok to send all supplies requested. Please order for me  She is no longer on meds for diabetes but has a h/o diabetes E11.9 last A1C prediabetic range  1x per day ok  I hope insurance will cover if she wants theses   TMs

## 2017-08-31 MED ORDER — BLOOD GLUCOSE MONITOR KIT: PACK | 0 refills | Status: DC

## 2017-08-31 NOTE — Telephone Encounter (Signed)
Printed for you to sign ,I will fax when printed. Thanks

## 2017-09-01 NOTE — Telephone Encounter (Signed)
Script faxed to [facility]

## 2017-09-01 NOTE — Telephone Encounter (Signed)
Pt called back and informed her it would be faxed over as soon as it was signed.

## 2017-09-07 ENCOUNTER — Ambulatory Visit (INDEPENDENT_AMBULATORY_CARE_PROVIDER_SITE_OTHER): Payer: Medicare Other | Admitting: Internal Medicine

## 2017-09-07 ENCOUNTER — Encounter: Payer: Self-pay | Admitting: Internal Medicine

## 2017-09-07 VITALS — BP 124/64 | HR 50 | Temp 98.1°F | Ht 62.0 in | Wt 196.4 lb

## 2017-09-07 DIAGNOSIS — N183 Chronic kidney disease, stage 3 unspecified: Secondary | ICD-10-CM

## 2017-09-07 DIAGNOSIS — E039 Hypothyroidism, unspecified: Secondary | ICD-10-CM

## 2017-09-07 DIAGNOSIS — R600 Localized edema: Secondary | ICD-10-CM

## 2017-09-07 DIAGNOSIS — N179 Acute kidney failure, unspecified: Secondary | ICD-10-CM

## 2017-09-07 DIAGNOSIS — D649 Anemia, unspecified: Secondary | ICD-10-CM

## 2017-09-07 DIAGNOSIS — R001 Bradycardia, unspecified: Secondary | ICD-10-CM | POA: Diagnosis not present

## 2017-09-07 DIAGNOSIS — I10 Essential (primary) hypertension: Secondary | ICD-10-CM

## 2017-09-07 LAB — TSH: TSH: 3.15 u[IU]/mL (ref 0.35–4.50)

## 2017-09-07 MED ORDER — CLONIDINE HCL 0.2 MG PO TABS: 0.1000 mg | ORAL_TABLET | Freq: Two times a day (BID) | ORAL | 1 refills | Status: DC

## 2017-09-07 NOTE — Progress Notes (Signed)
Pre visit review using our clinic review tool, if applicable. No additional management support is needed unless otherwise documented below in the visit note. 

## 2017-09-07 NOTE — Patient Instructions (Signed)
Please f/u in 1 month  Take Thyroid medication Levothyroxine 100 mg in am on empty stomach before food  Take Blood pressure medications in the am  -Losartan 100 mg daily in am  -Lasix 40 mg daily to 2x per day as needed at least daily  -cut clonidine 0.2 in 1/2 (half) to take 0.1 2x per day  -Take Nifedipine 30 mg daily in am    Write you blood pressure down for me  Make an eye doctors appt   Hypertension Hypertension is another name for high blood pressure. High blood pressure forces your heart to work harder to pump blood. This can cause problems over time. There are two numbers in a blood pressure reading. There is a top number (systolic) over a bottom number (diastolic). It is best to have a blood pressure below 120/80. Healthy choices can help lower your blood pressure. You may need medicine to help lower your blood pressure if:  Your blood pressure cannot be lowered with healthy choices.  Your blood pressure is higher than 130/80.  Follow these instructions at home: Eating and drinking  If directed, follow the DASH eating plan. This diet includes: ? Filling half of your plate at each meal with fruits and vegetables. ? Filling one quarter of your plate at each meal with whole grains. Whole grains include whole wheat pasta, brown rice, and whole grain bread. ? Eating or drinking low-fat dairy products, such as skim milk or low-fat yogurt. ? Filling one quarter of your plate at each meal with low-fat (lean) proteins. Low-fat proteins include fish, skinless chicken, eggs, beans, and tofu. ? Avoiding fatty meat, cured and processed meat, or chicken with skin. ? Avoiding premade or processed food.  Eat less than 1,500 mg of salt (sodium) a day.  Limit alcohol use to no more than 1 drink a day for nonpregnant women and 2 drinks a day for men. One drink equals 12 oz of beer, 5 oz of wine, or 1 oz of hard liquor. Lifestyle  Work with your doctor to stay at a healthy weight or to  lose weight. Ask your doctor what the best weight is for you.  Get at least 30 minutes of exercise that causes your heart to beat faster (aerobic exercise) most days of the week. This may include walking, swimming, or biking.  Get at least 30 minutes of exercise that strengthens your muscles (resistance exercise) at least 3 days a week. This may include lifting weights or pilates.  Do not use any products that contain nicotine or tobacco. This includes cigarettes and e-cigarettes. If you need help quitting, ask your doctor.  Check your blood pressure at home as told by your doctor.  Keep all follow-up visits as told by your doctor. This is important. Medicines  Take over-the-counter and prescription medicines only as told by your doctor. Follow directions carefully.  Do not skip doses of blood pressure medicine. The medicine does not work as well if you skip doses. Skipping doses also puts you at risk for problems.  Ask your doctor about side effects or reactions to medicines that you should watch for. Contact a doctor if:  You think you are having a reaction to the medicine you are taking.  You have headaches that keep coming back (recurring).  You feel dizzy.  You have swelling in your ankles.  You have trouble with your vision. Get help right away if:  You get a very bad headache.  You start to feel  confused.  You feel weak or numb.  You feel faint.  You get very bad pain in your: ? Chest. ? Belly (abdomen).  You throw up (vomit) more than once.  You have trouble breathing. Summary  Hypertension is another name for high blood pressure.  Making healthy choices can help lower blood pressure. If your blood pressure cannot be controlled with healthy choices, you may need to take medicine. This information is not intended to replace advice given to you by your health care provider. Make sure you discuss any questions you have with your health care provider. Document  Released: 01/07/2008 Document Revised: 06/18/2016 Document Reviewed: 06/18/2016 Elsevier Interactive Patient Education  [employer].

## 2017-09-07 NOTE — Progress Notes (Addendum)
Chief Complaint  Patient presents with  . Follow-up   Follow up  1. HTN improved pt is only taking Clonidine 0.2 bid not tid, Lasix 40 mg qd to bid, Nifedipine 30 XL qd Losartan 100 mg qd  2. Hypothyroidism she reports taking levothyroxine at night I reviewed when she should take in am on empty stomach today  3. C/o b/l leg numbness du eto f/u with surgeon Wednesday asked her to ask him if related to prior back surgery.   4. Asymptomatic bradycardia    Review of Systems  Constitutional: Negative for weight loss.  HENT: Negative for hearing loss.   Respiratory: Negative for shortness of breath.   Cardiovascular: Positive for leg swelling. Negative for chest pain.  Gastrointestinal: Negative for abdominal pain.  Musculoskeletal: Negative for back pain.  Skin: Negative for rash.  Neurological: Positive for sensory change. Negative for dizziness.       Numbness in legs b/l   Psychiatric/Behavioral: Negative for depression.   Past Medical History:  Diagnosis Date  . Acute blood loss anemia 03/14/2017  . Acute renal failure superimposed on stage 3 chronic kidney disease ([place]) 03/14/2017  . AKI (acute kidney injury) ([place]) 02/28/2017  . Altered mental status 03/29/2017  . Anemia   . Aortic atherosclerosis ([place])   . Arthritis   . Bilateral lower extremity edema 03/04/2017  . Bradycardia   . Chronic kidney disease    ?? renal insufficiency,   . CKD (chronic kidney disease) stage 3, GFR 30-59 ml/min (H[facility]) 01/15/2017   ?? renal insufficiency, which she thinks is coming from "all these medications"  . Diabetes mellitus without complication ([place])    diagnosed 4-5 yrs ago  . Disease of pancreas   . Diverticulitis    s/p perforation and partial colectomy 01/27/14 with 3 benign lymph nodes   . Diverticulosis 01/15/2017  . DJD (degenerative joint disease)   . DM (diabetes mellitus), type 2 with renal complications ([place]) 3/83/3383  . Fatty liver   . Hypertension   . Hypothyroidism   .  Hypothyroidism    ? etiology autoimmune or acquired   . Lethargy 02/28/2017  . Obesity, Class III, BMI 40-49.9 (morbid obesity) ([place]) 02/27/2017  . Pleural lipoma   . Postoperative wound infection 04/02/2017  . Spinal stenosis of lumbar region 01/15/2017  . Status post lumbar surgery 02/24/2017  . Ventral hernia   . Wound healing, delayed    back   Past Surgical History:  Procedure Laterality Date  . ABDOMINAL EXPOSURE N/A 02/24/2017   Procedure: ABDOMINAL EXPOSURE;  Surgeon: Angelia Mould, MD;  Location: [place];  Service: Vascular;  Laterality: N/A;  . ABDOMINAL HYSTERECTOMY  1987   no h/o abnormal paps   . ANTERIOR LAT LUMBAR FUSION N/A 02/24/2017   Procedure: Lumbar three- five Anterior lateral lumbar interbody fusion;  Surgeon: Ditty, Kevan Ny, MD;  Location: [place];  Service: Neurosurgery;  Laterality: N/A;  L3-5 Anterior lateral lumbar interbody fusion with removal of coflex at L3-4, L4-5  . ANTERIOR LUMBAR FUSION N/A 02/24/2017   Procedure: Stage 1: Lumbar five-Sacral one Anterior lumbar interbody fusion;  Surgeon: Ditty, Kevan Ny, MD;  Location: [facility];  Service: Neurosurgery;  Laterality: N/A;  Stage 1: L5-S1 Anterior lumbar interbody fusion  . APPLICATION OF ROBOTIC ASSISTANCE FOR SPINAL PROCEDURE N/A 02/26/2017   Procedure: APPLICATION OF ROBOTIC ASSISTANCE FOR SPINAL PROCEDURE;  Surgeon: Ditty, Kevan Ny, MD;  Location: [facility];  Service: Neurosurgery;  Laterality: N/A;  . APPLICATION OF WOUND  VAC N/A 03/30/2017   Procedure: APPLICATION OF WOUND VAC;  Surgeon: Ditty, Kevan Ny, MD;  Location: [facility];  Service: Neurosurgery;  Laterality: N/A;  . BACK SURGERY  2013  . BREAST SURGERY  1992   Breast Reduction  . CHOLECYSTECTOMY  1982  . COLON SURGERY  2015  . COLON SURGERY    . EYE SURGERY Right 2017   cataracts  . [place]  . LUMBAR FUSION  02/24/2017  . LUMBAR LAMINECTOMY WITH SPINOUS PROCESS PLATE 2 LEVEL N/A 7/32/2025   Procedure:  LUMBAR LAMINECTOMY/DECOMPRESSION MICRODISCECTOMY CoFlex;  Surgeon: Faythe Ghee, MD;  Location: [facility];  Service: Neurosurgery;  Laterality: N/A;  Lumbar three-four,Lumbar Four-Five Laminectomy with Coflex  . LUMBAR WOUND DEBRIDEMENT N/A 03/30/2017   Procedure: Lumbar wound exploration/debridement, placement of wound vac;  Surgeon: Ditty, Kevan Ny, MD;  Location: [facility];  Service: Neurosurgery;  Laterality: N/A;  Lumbar wound exploration/debridement, placement of wound vac  . PARTIAL COLECTOMY     01/27/14 diverticulitis and 3 benign lymph nodes [facility] Dr. Pat Patrick   . VENTRAL HERNIA REPAIR     Family History  Problem Relation Age of Onset  . Diabetes Father   . Heart attack Father   . Hypertension Father   . Heart disease Father   . Kidney disease Father   . Diabetes Mother   . Hypertension Mother   . Heart disease Mother   . Kidney disease Mother    Social History   Socioeconomic History  . Marital status: Widowed    Spouse name: Not on file  . Number of children: Not on file  . Years of education: Not on file  . Highest education level: Not on file  Social Needs  . Financial resource strain: Not on file  . Food insecurity - worry: Not on file  . Food insecurity - inability: Not on file  . Transportation needs - medical: Not on file  . Transportation needs - non-medical: Not on file  Occupational History  . Occupation: retired Economist work  Tobacco Use  . Smoking status: Never Smoker  . Smokeless tobacco: Never Used  Substance and Sexual Activity  . Alcohol use: No  . Drug use: No  . Sexual activity: No  Other Topics Concern  . Not on file  Social History Narrative   Admitted to [facility] 03/03/17   Widowed since 2012    2 sons [name] comes to appts)    Never smoked   Alcohol none   Full Code   12th grade education retired    Current Meds  Medication Sig  . acetaminophen (TYLENOL) 500 MG tablet Take 2 tablets (1,000 mg total) by mouth 3  (three) times daily.  Marland Kitchen atorvastatin (LIPITOR) 40 MG tablet Take 1 tablet (40 mg total) by mouth every evening.  . baclofen 5 MG TABS Take 5 mg by mouth 2 (two) times daily as needed for muscle spasms.  . blood glucose meter kit and supplies KIT Dispense based on patient and insurance preference. Use up to four times daily as directed. (FOR ICD-9 250.00, 250.01).  . Cholecalciferol 1000 units capsule Take 1 capsule (1,000 Units total) by mouth daily.  . cloNIDine (CATAPRES) 0.2 MG tablet Take 0.5 tablets (0.1 mg total) by mouth 2 (two) times daily. Take 1/2 tablet 2x per day  . ferrous sulfate 325 (65 FE) MG tablet Take 1 tablet (325 mg total) by mouth daily with lunch.  . furosemide (LASIX)  40 MG tablet Take 1 tablet (40 mg total) by mouth 2 (two) times daily.  Marland Kitchen HYDROcodone-acetaminophen (NORCO/VICODIN) 5-325 MG tablet Take 1 tablet by mouth. Take one tablet 30 minutes before treatment  . levothyroxine (SYNTHROID, LEVOTHROID) 100 MCG tablet Take 1 tablet (100 mcg total) by mouth daily before breakfast.  . losartan (COZAAR) 100 MG tablet Take 1 tablet (100 mg total) by mouth daily after breakfast.  . Magnesium Oxide 400 MG CAPS Take 1 capsule (400 mg total) by mouth daily.  Marland Kitchen NIFEdipine (PROCARDIA-XL/ADALAT CC) 30 MG 24 hr tablet Take 1 tablet (30 mg total) by mouth daily.  . pantoprazole (PROTONIX) 40 MG tablet Take 1 tablet (40 mg total) by mouth at bedtime.  . vitamin C (ASCORBIC ACID) 500 MG tablet Take 500 mg by mouth. Take one tablet twice daily to aid wound healing  . [DISCONTINUED] cloNIDine (CATAPRES) 0.2 MG tablet Take 1 tablet (0.2 mg total) by mouth 3 (three) times daily. Take one tablet three times daily   Allergies  Allergen Reactions  . Clams [Shellfish Allergy] Swelling and Other (See Comments)    THROAT SWELLS NECK TURNS RED  . Hydralazine Other (See Comments)    CHEST TIGHTNESS  . Milk-Related Compounds Diarrhea   Recent Results (from the past 2160 hour(s))   Comprehensive metabolic panel     Status: Abnormal   Collection Time: 07/21/17 10:01 AM  Result Value Ref Range   Sodium 138 135 - 145 mEq/L   Potassium 4.2 3.5 - 5.1 mEq/L   Chloride 107 96 - 112 mEq/L   CO2 25 19 - 32 mEq/L   Glucose, Bld 104 (H) 70 - 99 mg/dL   BUN 24 (H) 6 - 23 mg/dL   Creatinine, Ser 1.46 (H) 0.40 - 1.20 mg/dL   Total Bilirubin 0.4 0.2 - 1.2 mg/dL   Alkaline Phosphatase 42 39 - 117 U/L   AST 19 0 - 37 U/L   ALT 10 0 - 35 U/L   Total Protein 7.6 6.0 - 8.3 g/dL   Albumin 3.5 3.5 - 5.2 g/dL   Calcium 9.3 8.4 - 10.5 mg/dL   GFR 45.00 (L) >60.00 mL/min  CBC with Differential/Platelet     Status: Abnormal   Collection Time: 07/21/17 10:01 AM  Result Value Ref Range   WBC 4.7 4.0 - 10.5 K/uL   RBC 3.60 (L) 3.87 - 5.11 Mil/uL   Hemoglobin 10.4 (L) 12.0 - 15.0 g/dL   HCT 32.7 (L) 36.0 - 46.0 %   MCV 90.8 78.0 - 100.0 fl   MCHC 31.8 30.0 - 36.0 g/dL   RDW 14.7 11.5 - 15.5 %   Platelets 295.0 150.0 - 400.0 K/uL   Neutrophils Relative % 59.3 43.0 - 77.0 %   Lymphocytes Relative 29.2 12.0 - 46.0 %   Monocytes Relative 9.5 3.0 - 12.0 %   Eosinophils Relative 1.5 0.0 - 5.0 %   Basophils Relative 0.5 0.0 - 3.0 %   Neutro Abs 2.8 1.4 - 7.7 K/uL   Lymphs Abs 1.4 0.7 - 4.0 K/uL   Monocytes Absolute 0.5 0.1 - 1.0 K/uL   Eosinophils Absolute 0.1 0.0 - 0.7 K/uL   Basophils Absolute 0.0 0.0 - 0.1 K/uL  Lipid panel     Status: Abnormal   Collection Time: 07/21/17 10:01 AM  Result Value Ref Range   Cholesterol 194 0 - 200 mg/dL    Comment: ATP III Classification       Desirable:  < 200 mg/dL  Borderline High:  200 - 239 mg/dL          High:  > = 240 mg/dL   Triglycerides 172.0 (H) 0.0 - 149.0 mg/dL    Comment: Normal:  <150 mg/dLBorderline High:  150 - 199 mg/dL   HDL 51.20 >39.00 mg/dL   VLDL 34.4 0.0 - 40.0 mg/dL   LDL Cholesterol 109 (H) 0 - 99 mg/dL   Total CHOL/HDL Ratio 4     Comment:                Men          Women1/2 Average Risk     3.4           3.3Average Risk          5.0          4.42X Average Risk          9.6          7.13X Average Risk          15.0          11.0                       NonHDL 142.94     Comment: NOTE:  Non-HDL goal should be 30 mg/dL higher than patient's LDL goal (i.e. LDL goal of < 70 mg/dL, would have non-HDL goal of < 100 mg/dL)  T4, free     Status: None   Collection Time: 07/21/17 10:01 AM  Result Value Ref Range   Free T4 0.81 0.60 - 1.60 ng/dL    Comment: Specimens from patients who are undergoing biotin therapy and /or ingesting biotin supplements may contain high levels of biotin.  The higher biotin concentration in these specimens interferes with this Free T4 assay.  Specimens that contain high levels  of biotin may cause false high results for this Free T4 assay.  Please interpret results in light of the total clinical presentation of the patient.    TSH     Status: Abnormal   Collection Time: 07/21/17 10:01 AM  Result Value Ref Range   TSH 5.65 (H) 0.35 - 4.50 uIU/mL  Urinalysis, Routine w reflex microscopic     Status: Abnormal   Collection Time: 07/21/17 10:01 AM  Result Value Ref Range   Color, Urine YELLOW Yellow;Lt. Yellow   APPearance Sl Cloudy (A) Clear   Specific Gravity, Urine >=1.030 (A) 1.000 - 1.030   pH 5.5 5.0 - 8.0   Total Protein, Urine 100 (A) Negative   Urine Glucose NEGATIVE Negative   Ketones, ur NEGATIVE Negative   Bilirubin Urine NEGATIVE Negative   Hgb urine dipstick NEGATIVE Negative   Urobilinogen, UA 0.2 0.0 - 1.0   Leukocytes, UA NEGATIVE Negative   Nitrite NEGATIVE Negative   WBC, UA 21-50/hpf (A) 0-2/hpf   RBC / HPF none seen 0-2/hpf   Squamous Epithelial / LPF Few(5-10/hpf) (A) Rare(0-4/hpf)   Bacteria, UA Rare(<10/hpf) (A) None   Hyaline Casts, UA Presence of (A) None  Urine Microalbumin w/creat. ratio     Status: Abnormal   Collection Time: 07/21/17 10:01 AM  Result Value Ref Range   Microalb, Ur 87.2 (H) 0.0 - 1.9 mg/dL   Creatinine,U 217.2 mg/dL    Microalb Creat Ratio 40.1 (H) 0.0 - 30.0 mg/g  Hemoglobin A1c     Status: None   Collection Time: 07/21/17 10:01 AM  Result Value Ref Range   Hgb A1c MFr  Bld 6.1 4.6 - 6.5 %    Comment: Glycemic Control Guidelines for People with Diabetes:Non Diabetic:  <6%Goal of Therapy: <7%Additional Action Suggested:  >8%   Vitamin D (25 hydroxy)     Status: None   Collection Time: 07/21/17 10:01 AM  Result Value Ref Range   VITD 51.26 30.00 - 100.00 ng/mL  Magnesium     Status: Abnormal   Collection Time: 07/21/17 10:01 AM  Result Value Ref Range   Magnesium 1.3 (L) 1.5 - 2.5 mg/dL  Hepatitis B surface antibody     Status: Abnormal   Collection Time: 07/21/17 10:01 AM  Result Value Ref Range   Hepatitis B-Post <5 (L) > OR = 10 mIU/mL    Comment: . Patient does not have immunity to hepatitis B virus. . For additional information, please refer to http://education.questdiagnostics.com/faq/FAQ105 (This link is being provided for informational/ educational purposes only).   Hepatitis C antibody     Status: None   Collection Time: 07/21/17 10:01 AM  Result Value Ref Range   Hepatitis C Ab NON-REACTIVE NON-REACTI   SIGNAL TO CUT-OFF 0.07 <1.00  Hepatitis B surface antigen     Status: None   Collection Time: 07/21/17 10:01 AM  Result Value Ref Range   Hepatitis B Surface Ag NON-REACTIVE NON-REACTI  Hepatitis B core antibody, total     Status: None   Collection Time: 07/21/17 10:01 AM  Result Value Ref Range   Hep B Core Total Ab NON-REACTIVE NON-REACTI  Iron, TIBC and Ferritin Panel     Status: Abnormal   Collection Time: 07/21/17 10:01 AM  Result Value Ref Range   Iron 74 45 - 160 mcg/dL   TIBC 223 (L) 250 - 450 mcg/dL (calc)   %SAT 33 11 - 50 % (calc)   Ferritin 270 20 - 288 ng/mL  Basic Metabolic Panel (BMET)     Status: Abnormal   Collection Time: 08/06/17 10:16 AM  Result Value Ref Range   Sodium 138 135 - 145 mEq/L   Potassium 4.1 3.5 - 5.1 mEq/L   Chloride 104 96 - 112 mEq/L    CO2 27 19 - 32 mEq/L   Glucose, Bld 80 70 - 99 mg/dL   BUN 41 (H) 6 - 23 mg/dL   Creatinine, Ser 1.33 (H) 0.40 - 1.20 mg/dL   Calcium 9.6 8.4 - 10.5 mg/dL   GFR 50.10 (L) >60.00 mL/min  HM MAMMOGRAPHY     Status: None   Collection Time: 08/20/17 12:00 AM  Result Value Ref Range   HM Mammogram 0-4 Bi-Rad 0-4 Bi-Rad, Self Reported Normal    Comment: Category 1  TSH     Status: None   Collection Time: 09/07/17  8:38 AM  Result Value Ref Range   TSH 3.15 0.35 - 4.50 uIU/mL   Objective  Body mass index is 35.92 kg/m. Wt Readings from Last 3 Encounters:  09/07/17 196 lb 6.4 oz (89.1 kg)  08/06/17 199 lb (90.3 kg)  07/21/17 197 lb 2 oz (89.4 kg)   Temp Readings from Last 3 Encounters:  09/07/17 98.1 F (36.7 C) (Oral)  08/06/17 (!) 97.4 F (36.3 C) (Oral)  07/21/17 97.7 F (36.5 C) (Oral)   BP Readings from Last 3 Encounters:  09/07/17 124/64  08/06/17 (!) 174/74  07/21/17 (!) 158/78   Pulse Readings from Last 3 Encounters:  09/07/17 (!) 50  08/06/17 (!) 56  07/21/17 (!) 56   O2 sat room air 96%  Physical Exam  Constitutional: She  is oriented to person, place, and time and well-developed, well-nourished, and in no distress.  HENT:  Head: Normocephalic and atraumatic.  Mouth/Throat: Oropharynx is clear and moist and mucous membranes are normal.  Eyes: Conjunctivae are normal. Pupils are equal, round, and reactive to light.  Cardiovascular: Regular rhythm and normal heart sounds. Bradycardia present.  1+ leg edema b/l   Pulmonary/Chest: Effort normal and breath sounds normal.  Neurological: She is alert and oriented to person, place, and time. Gait normal. Gait normal.  BL walking with cane  Skin: Skin is warm, dry and intact.  Psychiatric: Mood, memory, affect and judgment normal.  Nursing note and vitals reviewed.   Assessment   1. HTN improved  2. Bradycardia w/o sx's  3. Hypothyroidism  4. Leg edema b/l  5.CKD 3  6. HM  Plan  1. Cont losartan 100 mg qd,  Clonidine she was only taking 0.2 bid will reduce to 0.1 (cut 1/2 of 0.2 bid), cont nifedepine 30 mg XL, lasix 40 mg qd to bid  2.  Try to see if reducing dose of Clonidine will help DDI with nifedipine  Will monitor  3. Check TSH today  4. Cont lasix 40 qd to bid prn  5. appt with [facility] renal this week  She did have acute AKI on CKD will check SPEP, UPEP, ANA to make sure not missing anything  Also consider renal US in future  Need help with renal in controlling BP and fluid status  6.  Had flu shot  Need to check status of Tdap had 08/08/08, pna 23 2004,08/08/08, prevnar, zostavax had 1/12/10shingrix with Dr. Ronnald Collum records pending records to review   mammo 08/20/17 neg  Colonoscopy had 2015 Dr. Pat Patrick [facility] need to get records or review [facility] records Dr. Bary Castilla 2013  Out of age window pap sp hysterectomy 1987 DEXA per Dr. Ronnald Collum 05/14/15 and 01/2017 unable to read requested mailed copy h/o osteopenia/osteoporosis  -saw copy 05/14/15 mild osteopenia left femoral neck   Neg hep C. rec hep B vaccine not immune   Pt needs to schedule eye MD appt in future   Of note lower ext numbness appt with surgeon this week asked pt to ask him if could be 2/2 prior back surgery. Consider EMG/NCS in future with neurology   Reviewed notes Dr. Ronnald Collum  H/o BBB H/o Graves/hyper+hypothyriodism with elevated TSIG  -s/p 131 tx hyperthyroidism 06/14/14  Vitamin D def  H/o DM 2  Stress test 08/2016 borderline chemical stress test per review of notes. 09/2016 SPECT cardiac study EF 50% fixed defect from breast soft tissue +,   H/[facility] exam [facility] need to get records had in 2015  Provider: Dr. Olivia Mackie McLean-Scocuzza-Internal Medicine

## 2017-09-09 LAB — ANA: Anti Nuclear Antibody(ANA): POSITIVE — AB

## 2017-09-09 LAB — PROTEIN ELECTROPHORESIS, SERUM
ALPHA 1: 0.3 g/dL (ref 0.2–0.3)
Albumin ELP: 3.7 g/dL — ABNORMAL LOW (ref 3.8–4.8)
Alpha 2: 0.9 g/dL (ref 0.5–0.9)
BETA GLOBULIN: 0.4 g/dL (ref 0.4–0.6)
Beta 2: 0.5 g/dL (ref 0.2–0.5)
GAMMA GLOBULIN: 2.1 g/dL — AB (ref 0.8–1.7)
Total Protein: 7.9 g/dL (ref 6.1–8.1)

## 2017-09-09 LAB — ANTI-NUCLEAR AB-TITER (ANA TITER)

## 2017-09-09 LAB — PROTEIN ELECTROPHORESIS, URINE REFLEX
Albumin ELP, Urine: 79.9 %
Alpha-1-Globulin, U: 0.7 %
Alpha-2-Globulin, U: 3 %
BETA GLOBULIN, U: 6.8 %
GAMMA GLOBULIN, U: 9.6 %
Protein, Ur: 17.5 mg/dL

## 2017-09-14 ENCOUNTER — Encounter: Payer: Self-pay | Admitting: Internal Medicine

## 2017-09-15 ENCOUNTER — Other Ambulatory Visit: Payer: Self-pay | Admitting: Internal Medicine

## 2017-09-15 DIAGNOSIS — R768 Other specified abnormal immunological findings in serum: Secondary | ICD-10-CM

## 2017-09-16 ENCOUNTER — Telehealth: Payer: Self-pay

## 2017-09-16 NOTE — Telephone Encounter (Signed)
-----   Message from Delorise Jackson, MD sent at 09/14/2017  6:14 PM EST ----- Please call Dr. Ronnald Collum office can they mail copy of DEXA 2016 I cant read faxed copy   Thanks [place]

## 2017-09-16 NOTE — Telephone Encounter (Signed)
-----   Message from Delorise Jackson, MD sent at 09/14/2017  6:17 PM EST ----- Need copies mailed bone density 2016 and 2018   Dr. Ronnald Collum call his office please   Thanks [place]

## 2017-09-16 NOTE — Telephone Encounter (Signed)
Request has been made once more this is the second time.

## 2017-09-16 NOTE — Telephone Encounter (Signed)
Office stated they can try and send a clearer picture of results.

## 2017-09-28 ENCOUNTER — Ambulatory Visit: Payer: Self-pay | Admitting: *Deleted

## 2017-09-28 NOTE — Telephone Encounter (Signed)
Make sure pt taking 1/2 pill of clonidine 0.2 so 0.1 mg bid Normally HR is 50-56 w/o sx's Will address at f/u   [place]

## 2017-09-28 NOTE — Telephone Encounter (Signed)
Please advise 

## 2017-09-28 NOTE — Telephone Encounter (Signed)
Routing to PCP for any further consideration. Noted her last 3 readings have been 50-57 bpm. Reason for Disposition . Heart beating very slowly (e.g., < 50 / minute)  (Exception: athlete)    47, up to 58 bpm and regular heart sounds reported by [name], RN during her visit. Patient is stating this is normal for her and is asymptomatic.  Answer Assessment - Initial Assessment Questions 1. DESCRIPTION: "Please describe your heart rate or heart beat that you are having" (e.g., fast/slow, regular/irregular, skipped or extra beats, "palpitations")    Feels and sounds regular, is with [name].  HR 47 at time of her visit 2. ONSET: "When did it start?" (Minutes, hours or days)      Today with [name], Therapist, sports. Heart rate 45-47.  3. DURATION: "How long does it last" (e.g., seconds, minutes, hours)   Stays at that. Patient states this is her normal for her.  4. PATTERN "Does it come and go, or has it been constant since it started?"  "Does it get worse with exertion?"   "Are you feeling it now?" Stays low. She has no symptoms reports the RN. 5. TAP: "Using your hand, can you tap out what you are feeling on a chair or table in front of you, so that I can hear?" (Note: not all patients can do this)    normal reports RN 6. HEART RATE: "Can you tell me your heart rate?" "How many beats in 15 seconds?"  (Note: not all patients can do this)     47-58 bpm 7. RECURRENT SYMPTOM: "Have you ever had this before?" If so, ask: "When was the last time?" and "What happened that time?"     Patient reports normal 8. CAUSE: "What do you think is causing the palpitations?"    unknown 9. CARDIAC HISTORY: "Do you have any history of heart disease?" (e.g., heart attack, angina, bypass surgery, angioplasty, arrhythmia)     no 10. OTHER SYMPTOMS: "Do you have any other symptoms?" (e.g., dizziness, chest pain, sweating, difficulty breathing)     no 11. PREGNANCY: "Is there any chance you are pregnant?" "When was  your last menstrual period?"     no  Protocols used: HEART RATE AND HEARTBEAT QUESTIONS-A-AH

## 2017-09-30 NOTE — Telephone Encounter (Signed)
Patient was informed and stated she will comply with the directions?

## 2017-10-05 ENCOUNTER — Other Ambulatory Visit: Payer: Self-pay | Admitting: Internal Medicine

## 2017-10-05 DIAGNOSIS — R1904 Left lower quadrant abdominal swelling, mass and lump: Secondary | ICD-10-CM

## 2017-10-07 ENCOUNTER — Ambulatory Visit: Payer: Self-pay | Admitting: Internal Medicine

## 2017-10-07 DIAGNOSIS — Z0289 Encounter for other administrative examinations: Secondary | ICD-10-CM

## 2017-10-09 ENCOUNTER — Ambulatory Visit: Payer: Medicare Other

## 2017-10-21 ENCOUNTER — Other Ambulatory Visit: Payer: Self-pay

## 2017-10-21 DIAGNOSIS — E039 Hypothyroidism, unspecified: Secondary | ICD-10-CM

## 2017-10-21 MED ORDER — LEVOTHYROXINE SODIUM 100 MCG PO TABS: 100.0000 ug | ORAL_TABLET | Freq: Every day | ORAL | 0 refills | Status: DC

## 2017-11-03 ENCOUNTER — Other Ambulatory Visit: Payer: Self-pay | Admitting: Internal Medicine

## 2017-11-03 DIAGNOSIS — I10 Essential (primary) hypertension: Secondary | ICD-10-CM

## 2017-11-03 MED ORDER — CLONIDINE HCL 0.1 MG PO TABS: 0.1000 mg | ORAL_TABLET | Freq: Two times a day (BID) | ORAL | 0 refills | Status: DC

## 2017-11-03 MED ORDER — CLONIDINE HCL 0.1 MG PO TABS: 0.1000 mg | ORAL_TABLET | Freq: Two times a day (BID) | ORAL | Status: DC

## 2017-11-03 MED ORDER — NIFEDIPINE ER 30 MG PO TB24: 30.0000 mg | ORAL_TABLET | Freq: Every day | ORAL | 0 refills | Status: DC

## 2017-11-23 ENCOUNTER — Other Ambulatory Visit: Payer: Self-pay | Admitting: Internal Medicine

## 2017-11-23 ENCOUNTER — Telehealth: Payer: Self-pay

## 2017-11-23 DIAGNOSIS — R609 Edema, unspecified: Secondary | ICD-10-CM

## 2017-11-23 DIAGNOSIS — I10 Essential (primary) hypertension: Secondary | ICD-10-CM

## 2017-11-23 MED ORDER — FUROSEMIDE 40 MG PO TABS: 40.0000 mg | ORAL_TABLET | Freq: Two times a day (BID) | ORAL | 2 refills | Status: DC

## 2017-11-23 NOTE — Telephone Encounter (Signed)
   Copied from [place] [id number]. Topic: Medicare AWV >> Nov 23, 2017 11:44 AM Percell Belt A wrote: Reason for CRM: [facility] called in and stated that pt needs a AWV in the month of may.  They would like [place] to contact pt and sch it

## 2017-11-30 NOTE — Telephone Encounter (Signed)
Scheduled 4.30.19 at 11am. Pt aware

## 2017-12-01 ENCOUNTER — Ambulatory Visit (INDEPENDENT_AMBULATORY_CARE_PROVIDER_SITE_OTHER): Payer: Medicare Other

## 2017-12-01 VITALS — BP 138/74 | HR 58 | Temp 98.3°F | Resp 15 | Ht 62.0 in | Wt 204.4 lb

## 2017-12-01 DIAGNOSIS — Z0000 Encounter for general adult medical examination without abnormal findings: Secondary | ICD-10-CM | POA: Diagnosis not present

## 2017-12-01 NOTE — Patient Instructions (Addendum)
  Taylor Tyler , Thank you for taking time to come for your Medicare Wellness Visit. I appreciate your ongoing commitment to your health goals. Please review the following plan we discussed and let me know if I can assist you in the future.   Follow up as needed.    Bring a copy of your [place] and/or Living Will to be scanned into chart.  Have a great day!  These are the goals we discussed: Goals    . Increase physical activity     Walk for exercise, as tolerated       This is a list of the screening recommended for you and due dates:  Health Maintenance  Topic Date Due  . Complete foot exam   03/04/2018*  . Eye exam for diabetics  03/04/2018*  . DEXA scan (bone density measurement)  03/04/2018*  . Colon Cancer Screening  03/04/2018*  . Tetanus Vaccine  03/04/2018*  . Pneumonia vaccines (1 of 2 - PCV13) 03/04/2018*  . Hemoglobin A1C  01/19/2018  . Flu Shot  03/04/2018  . Mammogram  08/21/2019  *Topic was postponed. The date shown is not the original due date.

## 2017-12-01 NOTE — Progress Notes (Signed)
Subjective:   Taylor Tyler is a 77 y.o. female who presents for an Initial Medicare Annual Wellness Visit.  Review of Systems    No ROS.  Medicare Wellness Visit. Additional risk factors are reflected in the social history.  Cardiac Risk Factors include: diabetes mellitus;advanced age (>72mn, >>35women);hypertension;obesity (BMI >30kg/m2)     Objective:    Today's Vitals   12/01/17 1103  BP: 138/74  Pulse: (!) 58  Resp: 15  Temp: 98.3 F (36.8 C)  TempSrc: Oral  SpO2: 96%  Weight: 204 lb 6.4 oz (92.7 kg)  Height: _0  (1.575 m)   Body mass index is 37.39 kg/m.  Advanced Directives 12/01/2017 05/29/2017 05/22/2017 05/13/2017 05/04/2017 04/08/2017 03/30/2017  Does Patient Have a Medical Advance Directive? Yes _1  No  Type of Advance Directive Living will;Healthcare Power of Attorney - - - - - -  Does patient want to make changes to medical advance directive? No - Patient declined - - - - - -  Copy of [place]? No - copy requested - - - - - -  Would patient like information on creating a medical advance directive? - - - - - - No - Patient declined  Pre-existing out of facility DNR order (yellow form or pink MOST form) - - - - - - -    Current Medications (verified) Outpatient Encounter Medications as of 12/01/2017  Medication Sig  . acetaminophen (TYLENOL) 500 MG tablet Take 2 tablets (1,000 mg total) by mouth 3 (three) times daily.  .Marland Kitchenatorvastatin (LIPITOR) 40 MG tablet Take 1 tablet (40 mg total) by mouth every evening.  . baclofen 5 MG TABS Take 5 mg by mouth 2 (two) times daily as needed for muscle spasms.  . blood glucose meter kit and supplies KIT Dispense based on patient and insurance preference. Use up to four times daily as directed. (FOR ICD-9 250.00, 250.01).  . Cholecalciferol 1000 units capsule Take 1 capsule (1,000 Units total) by mouth daily.  . cloNIDine (CATAPRES) 0.1 MG tablet Take 1 tablet (0.1 mg total) by mouth 2  (two) times daily.  . ferrous sulfate 325 (65 FE) MG tablet Take 1 tablet (325 mg total) by mouth daily with lunch.  . furosemide (LASIX) 40 MG tablet Take 1 tablet (40 mg total) by mouth 2 (two) times daily.  .Marland KitchenHYDROcodone-acetaminophen (NORCO/VICODIN) 5-325 MG tablet Take 1 tablet by mouth. Take one tablet 30 minutes before treatment  . levothyroxine (SYNTHROID, LEVOTHROID) 100 MCG tablet Take 1 tablet (100 mcg total) by mouth daily before breakfast.  . losartan (COZAAR) 100 MG tablet Take 1 tablet (100 mg total) by mouth daily after breakfast.  . Magnesium Oxide 400 MG CAPS Take 1 capsule (400 mg total) by mouth daily.  .Marland KitchenNIFEdipine (PROCARDIA-XL/ADALAT CC) 30 MG 24 hr tablet Take 1 tablet (30 mg total) by mouth daily.  . pantoprazole (PROTONIX) 40 MG tablet Take 1 tablet (40 mg total) by mouth at bedtime.  . vitamin C (ASCORBIC ACID) 500 MG tablet Take 500 mg by mouth. Take one tablet twice daily to aid wound healing   No facility-administered encounter medications on file as of 12/01/2017.     Allergies (verified) Clams [shellfish allergy]; Hydralazine; and Milk-related compounds   History: Past Medical History:  Diagnosis Date  . Acute blood loss anemia 03/14/2017  . Acute renal failure superimposed on stage 3 chronic kidney disease ([place] 03/14/2017  . AKI (acute kidney injury) (  [place]) 02/28/2017  . Altered mental status 03/29/2017  . Anemia   . Aortic atherosclerosis ([place])   . Arthritis   . Bilateral lower extremity edema 03/04/2017  . Bradycardia   . Cataract   . Chronic kidney disease    ?? renal insufficiency,   . CKD (chronic kidney disease) stage 3, GFR 30-59 ml/min ([facility]) 01/15/2017   ?? renal insufficiency, which she thinks is coming from "all these medications"  . Diabetes mellitus without complication ([place])    diagnosed 4-5 yrs ago  . Disease of pancreas   . Diverticulitis    s/p perforation and partial colectomy 01/27/14 with 3 benign lymph nodes   . Diverticulosis  01/15/2017  . DJD (degenerative joint disease)   . DM (diabetes mellitus), type 2 with renal complications ([place]) 01/08/3709  . Fatty liver   . Hypertension   . Hypothyroidism   . Hypothyroidism    ? etiology autoimmune or acquired   . Kidney stone   . Lethargy 02/28/2017  . Obesity, Class III, BMI 40-49.9 (morbid obesity) ([place]) 02/27/2017  . Pleural lipoma   . Postoperative wound infection 04/02/2017  . Spinal stenosis of lumbar region 01/15/2017  . Status post lumbar surgery 02/24/2017  . Ventral hernia   . Wound healing, delayed    back   Past Surgical History:  Procedure Laterality Date  . ABDOMINAL EXPOSURE N/A 02/24/2017   Procedure: ABDOMINAL EXPOSURE;  Surgeon: Angelia Mould, MD;  Location: [facility];  Service: Vascular;  Laterality: N/A;  . ABDOMINAL HYSTERECTOMY  1987   no h/o abnormal paps   . ANTERIOR LAT LUMBAR FUSION N/A 02/24/2017   Procedure: Lumbar three- five Anterior lateral lumbar interbody fusion;  Surgeon: Ditty, Kevan Ny, MD;  Location: [place] [facility];  Service: Neurosurgery;  Laterality: N/A;  L3-5 Anterior lateral lumbar interbody fusion with removal of coflex at L3-4, L4-5  . ANTERIOR LUMBAR FUSION N/A 02/24/2017   Procedure: Stage 1: Lumbar five-Sacral one Anterior lumbar interbody fusion;  Surgeon: Ditty, Kevan Ny, MD;  Location: [facility];  Service: Neurosurgery;  Laterality: N/A;  Stage 1: L5-S1 Anterior lumbar interbody fusion  . APPENDECTOMY    . APPLICATION OF ROBOTIC ASSISTANCE FOR SPINAL PROCEDURE N/A 02/26/2017   Procedure: APPLICATION OF ROBOTIC ASSISTANCE FOR SPINAL PROCEDURE;  Surgeon: Ditty, Kevan Ny, MD;  Location: [facility];  Service: Neurosurgery;  Laterality: N/A;  . APPLICATION OF WOUND VAC N/A 03/30/2017   Procedure: APPLICATION OF WOUND VAC;  Surgeon: Ditty, Kevan Ny, MD;  Location: [facility];  Service: Neurosurgery;  Laterality: N/A;  . BACK SURGERY  2013  . BREAST SURGERY  1992   Breast Reduction  . CHOLECYSTECTOMY  1982  . COLON  SURGERY  01/26/2014   desc.sigmoid colectomy and ventral hernia repair and splenic flexure mobilization  . COLON SURGERY    . EYE SURGERY Right 2017   cataracts  . [place]  . LUMBAR FUSION  02/24/2017  . LUMBAR LAMINECTOMY WITH SPINOUS PROCESS PLATE 2 LEVEL N/A 01/28/9484   Procedure: LUMBAR LAMINECTOMY/DECOMPRESSION MICRODISCECTOMY CoFlex;  Surgeon: Faythe Ghee, MD;  Location: [facility];  Service: Neurosurgery;  Laterality: N/A;  Lumbar three-four,Lumbar Four-Five Laminectomy with Coflex  . LUMBAR WOUND DEBRIDEMENT N/A 03/30/2017   Procedure: Lumbar wound exploration/debridement, placement of wound vac;  Surgeon: Ditty, Kevan Ny, MD;  Location: [facility];  Service: Neurosurgery;  Laterality: N/A;  Lumbar wound exploration/debridement, placement of wound vac  . PARTIAL COLECTOMY     01/27/14 diverticulitis and 3  benign lymph nodes [facility] Dr. Pat Patrick   . VENTRAL HERNIA REPAIR     Family History  Problem Relation Age of Onset  . Diabetes Father   . Heart attack Father   . Hypertension Father   . Heart disease Father   . Kidney disease Father   . Diabetes Mother   . Hypertension Mother   . Heart disease Mother   . Kidney disease Mother   . Lupus Sister   . Heart disease Brother   . Diabetes Brother   . Heart disease Brother   . Diabetes Brother   . Lupus Sister    Social History   Socioeconomic History  . Marital status: Widowed    Spouse name: Not on file  . Number of children: Not on file  . Years of education: Not on file  . Highest education level: Not on file  Occupational History  . Occupation: retired Economist work  Scientific laboratory technician  . Financial resource strain: Not hard at all  . Food insecurity:    Worry: Never true    Inability: Never true  . Transportation needs:    Medical: No    Non-medical: No  Tobacco Use  . Smoking status: Never Smoker  . Smokeless tobacco: Never Used  Substance and Sexual Activity  . Alcohol use: No  . Drug use: No  .  Sexual activity: Never  Lifestyle  . Physical activity:    Days per week: 0 days    Minutes per session: Not on file  . Stress: Not on file  Relationships  . Social connections:    Talks on phone: Not on file    Gets together: Not on file    Attends religious service: Not on file    Active member of club or organization: Not on file    Attends meetings of clubs or organizations: Not on file    Relationship status: Not on file  Other Topics Concern  . Not on file  Social History Narrative   Admitted to [facility] 03/03/17   Widowed since 2012    2 sons [name] comes to appts)    Never smoked   Alcohol none   Full Code   12th grade education retired     Tobacco Counseling Counseling given: Not Answered   Clinical Intake:  Pre-visit preparation completed: Yes  Pain : No/denies pain     Nutritional Status: BMI > 30  Obese Diabetes: Yes(Followed by PCP)  How often do you need to have someone help you when you read instructions, pamphlets, or other written materials from your doctor or pharmacy?: 1 - Never  Interpreter Needed?: No      Activities of Daily Living In your present state of health, do you have any difficulty performing the following activities: 12/01/2017 03/29/2017  Hearing? N N  Vision? N N  Difficulty concentrating or making decisions? N Y  Walking or climbing stairs? Y Y  Comment Unsteady gait when navigating stairs or walking long distances -  Dressing or bathing? N Y  Doing errands, shopping? N Y  Conservation officer, nature and eating ? N -  Using the Toilet? N -  In the past six months, have you accidently leaked urine? N -  Do you have problems with loss of bowel control? N -  Managing your Medications? N -  Managing your Finances? N -  Housekeeping or managing your Housekeeping? N -  Some recent data might be hidden  Immunizations and Health Maintenance Immunization History  Administered Date(s) Administered  . Influenza, High  Dose Seasonal PF 08/06/2017  . Influenza-Unspecified 05/26/2012, 04/20/2013   There are no preventive care reminders to display for this patient.  Patient Care Team: McLean-Scocuzza, Nino Glow, MD as PCP - General (Internal Medicine) Ditty, Kevan Ny, MD as Consulting Physician (Neurosurgery)  Indicate any recent Medical Services you may have received from other than [facility] providers in the past year (date may be approximate).     Assessment:   This is a routine wellness examination for [facility].  The goal of the wellness visit is to assist the patient how to close the gaps in care and create a preventative care plan for the patient.   The roster of all physicians providing medical care to patient is listed in the Snapshot section of the chart.  Taking calcium VIT D as appropriate/Osteoporosis risk reviewed.    Safety issues reviewed; Smoke and carbon monoxide detectors in the home. No firearms or firearms locked in a safe within the home. Wears seatbelts when driving or riding with others. No violence in the home.  They do not have excessive sun exposure.  Discussed the need for sun protection: hats, long sleeves and the use of sunscreen if there is significant sun exposure.  Patient is alert, normal appearance, oriented to person/place/and time.  Correctly identified the president of the [place] and recalls of 3/3 words. Performs simple calculations and can read correct time from watch face. Displays appropriate judgement.  No new identified risk were noted.  No failures at ADL's or IADL's.  Ambulates with walker as needed.   BMI- discussed the importance of a healthy diet, water intake and the benefits of aerobic exercise. Educational material provided.   24 hour diet recall: Lean meats, low carb foods   Dental- every 12 months.  Eye- Visual acuity not assessed per patient preference since they have regular follow up with the ophthalmologist.  Wears corrective lenses.  Sleep  patterns- Sleeps without issues.   Health maintenance gaps- closed.  Patient Concerns: None at this time. Follow up with PCP as needed.  Hearing/Vision screen Hearing Screening Comments: Patient is able to hear conversational tones without difficulty.  No issues reported.   Vision Screening Comments: Followed by Dr.Richardson Last OV 2019 Visual acuity not assessed per patient preference since they have regular follow up with the ophthalmologist  Dietary issues and exercise activities discussed: Current Exercise Habits: The patient does not participate in regular exercise at present  Goals    . Increase physical activity     Walk for exercise, as tolerated      Depression Screen PHQ 2/9 Scores 12/01/2017 09/07/2017 07/02/2017 05/07/2017  PHQ - 2 Score 0 0 0 0    Fall Risk Fall Risk  12/01/2017 09/07/2017 07/02/2017 05/07/2017  Falls in the past year? No No Yes No  Number falls in past yr: - - 1 -  Injury with Fall? - - No -   Cognitive Function: MMSE - Mini Mental State Exam 12/01/2017  Orientation to time 5  Orientation to Place 5  Registration 3  Attention/ Calculation 5  Recall 3  Language- name 2 objects 2  Language- repeat 1  Language- follow 3 step command 3  Language- read & follow direction 1  Write a sentence 1  Copy design 1  Total score 30        Screening Tests Health Maintenance  Topic Date Due  . FOOT EXAM  03/04/2018 (Originally 03/15/1951)  . OPHTHALMOLOGY EXAM  03/04/2018 (Originally 03/15/1951)  . DEXA SCAN  03/04/2018 (Originally 03/15/2008)  . COLONOSCOPY  03/04/2018 (Originally 03/15/1993)  . TETANUS/TDAP  03/04/2018 (Originally 03/15/1962)  . PNA vac Low Risk Adult (1 of 2 - PCV13) 03/04/2018 (Originally 03/15/2008)  . HEMOGLOBIN A1C  01/19/2018  . INFLUENZA VACCINE  03/04/2018  . MAMMOGRAM  08/21/2019     Plan:    End of life planning; Advance aging; Advanced directives discussed. Copy of current HCPOA/Living Will requested.    I have  personally reviewed and noted the following in the patient's chart:   . Medical and social history . Use of alcohol, tobacco or illicit drugs  . Current medications and supplements . Functional ability and status . Nutritional status . Physical activity . Advanced directives . List of other physicians . Hospitalizations, surgeries, and ER visits in previous 12 months . Vitals . Screenings to include cognitive, depression, and falls . Referrals and appointments  In addition, I have reviewed and discussed with patient certain preventive protocols, quality metrics, and best practice recommendations. A written personalized care plan for preventive services as well as general preventive health recommendations were provided to patient.     Varney Biles, LPN   4/60/0298

## 2017-12-08 DIAGNOSIS — M4727 Other spondylosis with radiculopathy, lumbosacral region: Secondary | ICD-10-CM | POA: Diagnosis not present

## 2017-12-08 DIAGNOSIS — M5440 Lumbago with sciatica, unspecified side: Secondary | ICD-10-CM | POA: Diagnosis not present

## 2018-01-05 ENCOUNTER — Ambulatory Visit (INDEPENDENT_AMBULATORY_CARE_PROVIDER_SITE_OTHER): Payer: Medicare Other

## 2018-01-05 ENCOUNTER — Ambulatory Visit (INDEPENDENT_AMBULATORY_CARE_PROVIDER_SITE_OTHER): Payer: Medicare Other | Admitting: Internal Medicine

## 2018-01-05 ENCOUNTER — Encounter: Payer: Self-pay | Admitting: Internal Medicine

## 2018-01-05 VITALS — BP 182/82 | HR 77 | Temp 98.0°F | Ht 62.0 in | Wt 208.2 lb

## 2018-01-05 DIAGNOSIS — D649 Anemia, unspecified: Secondary | ICD-10-CM

## 2018-01-05 DIAGNOSIS — M25561 Pain in right knee: Secondary | ICD-10-CM

## 2018-01-05 DIAGNOSIS — M25562 Pain in left knee: Secondary | ICD-10-CM | POA: Diagnosis not present

## 2018-01-05 DIAGNOSIS — M329 Systemic lupus erythematosus, unspecified: Secondary | ICD-10-CM

## 2018-01-05 DIAGNOSIS — G629 Polyneuropathy, unspecified: Secondary | ICD-10-CM

## 2018-01-05 DIAGNOSIS — M1711 Unilateral primary osteoarthritis, right knee: Secondary | ICD-10-CM | POA: Diagnosis not present

## 2018-01-05 DIAGNOSIS — I10 Essential (primary) hypertension: Secondary | ICD-10-CM | POA: Diagnosis not present

## 2018-01-05 DIAGNOSIS — M1712 Unilateral primary osteoarthritis, left knee: Secondary | ICD-10-CM | POA: Diagnosis not present

## 2018-01-05 DIAGNOSIS — K760 Fatty (change of) liver, not elsewhere classified: Secondary | ICD-10-CM | POA: Diagnosis not present

## 2018-01-05 DIAGNOSIS — G8929 Other chronic pain: Secondary | ICD-10-CM

## 2018-01-05 DIAGNOSIS — K439 Ventral hernia without obstruction or gangrene: Secondary | ICD-10-CM | POA: Diagnosis not present

## 2018-01-05 DIAGNOSIS — E876 Hypokalemia: Secondary | ICD-10-CM

## 2018-01-05 LAB — BASIC METABOLIC PANEL
BUN: 45 mg/dL — AB (ref 6–23)
CO2: 28 mEq/L (ref 19–32)
Calcium: 9.6 mg/dL (ref 8.4–10.5)
Chloride: 105 mEq/L (ref 96–112)
Creatinine, Ser: 1.5 mg/dL — ABNORMAL HIGH (ref 0.40–1.20)
GFR: 43.56 mL/min — AB (ref >60.00)
GLUCOSE: 76 mg/dL (ref 70–99)
POTASSIUM: 4.1 meq/L (ref 3.5–5.1)
SODIUM: 139 meq/L (ref 135–145)

## 2018-01-05 LAB — CBC WITH DIFFERENTIAL/PLATELET
BASOS PCT: 0.5 % (ref 0.0–3.0)
Basophils Absolute: 0 10*3/uL (ref 0.0–0.1)
EOS PCT: 0.7 % (ref 0.0–5.0)
Eosinophils Absolute: 0 10*3/uL (ref 0.0–0.7)
HCT: 33.5 % — ABNORMAL LOW (ref 36.0–46.0)
Hemoglobin: 11.2 g/dL — ABNORMAL LOW (ref 12.0–15.0)
LYMPHS ABS: 1.4 10*3/uL (ref 0.7–4.0)
Lymphocytes Relative: 22.6 % (ref 12.0–46.0)
MCHC: 33.5 g/dL (ref 30.0–36.0)
MCV: 89.4 fl (ref 78.0–100.0)
MONO ABS: 0.6 10*3/uL (ref 0.1–1.0)
MONOS PCT: 9.2 % (ref 3.0–12.0)
NEUTROS PCT: 67 % (ref 43.0–77.0)
Neutro Abs: 4.2 10*3/uL (ref 1.4–7.7)
PLATELETS: 312 10*3/uL (ref 150.0–400.0)
RBC: 3.75 Mil/uL — ABNORMAL LOW (ref 3.87–5.11)
RDW: 13.5 % (ref 11.5–15.5)
WBC: 6.3 10*3/uL (ref 4.0–10.5)

## 2018-01-05 LAB — IRON,TIBC AND FERRITIN PANEL
%SAT: 30 % (calc) (ref 11–50)
FERRITIN: 276 ng/mL (ref 20–288)
IRON: 82 ug/dL (ref 45–160)
TIBC: 271 mcg/dL (calc) (ref 250–450)

## 2018-01-05 LAB — MAGNESIUM: MAGNESIUM: 2.2 mg/dL (ref 1.5–2.5)

## 2018-01-05 MED ORDER — DICLOFENAC SODIUM 1 % TD GEL: 4.0000 g | Freq: Four times a day (QID) | TRANSDERMAL | 11 refills | Status: DC

## 2018-01-05 MED ORDER — GABAPENTIN 300 MG PO CAPS: 300.0000 mg | ORAL_CAPSULE | Freq: Every day | ORAL | 1 refills | Status: DC

## 2018-01-05 NOTE — Patient Instructions (Addendum)
Tylenol 500 mg x 2 up to 3x per day  Voltaren gel for your knees up to 4x per day Gabapentin 300 mg at night  Call Dr. Meda Coffee to make appt [phone] for appt now  Dr. Johnney Ou kidney [phone] kidney doctor due 02/2018 end to 04/2018 for appt  Take care    Fatty Liver Fatty liver, also called hepatic steatosis or steatohepatitis, is a condition in which too much fat has built up in your liver cells. The liver removes harmful substances from your bloodstream. It produces fluids your body needs. It also helps your body use and store energy from the food you eat. In many cases, fatty liver does not cause symptoms or problems. It is often diagnosed when tests are being done for other reasons. However, over time, fatty liver can cause inflammation that may lead to more serious liver problems, such as scarring of the liver (cirrhosis). What are the causes? Causes of fatty liver may include:  Drinking too much alcohol.  Poor nutrition.  Obesity.  Cushing syndrome.  Diabetes.  Hyperlipidemia.  Pregnancy.  Certain drugs.  Poisons.  Some viral infections.  What increases the risk? You may be more likely to develop fatty liver if you:  Abuse alcohol.  Are pregnant.  Are overweight.  Have diabetes.  Have hepatitis.  Have a high triglyceride level.  What are the signs or symptoms? Fatty liver often does not cause any symptoms. In cases where symptoms develop, they can include:  Fatigue.  Weakness.  Weight loss.  Confusion.  Abdominal pain.  Yellowing of your skin and the white parts of your eyes (jaundice).  Nausea and vomiting.  How is this diagnosed? Fatty liver may be diagnosed by:  Physical exam and medical history.  Blood tests.  Imaging tests, such as an ultrasound, CT scan, or MRI.  Liver biopsy. A small sample of liver tissue is removed using a needle. The sample is then looked at under a microscope.  How is this treated? Fatty liver is  often caused by other health conditions. Treatment for fatty liver may involve medicines and lifestyle changes to manage conditions such as:  Alcoholism.  High cholesterol.  Diabetes.  Being overweight or obese.  Follow these instructions at home:  Eat a healthy diet as directed by your health care provider.  Exercise regularly. This can help you lose weight and control your cholesterol and diabetes. Talk to your health care provider about an exercise plan and which activities are best for you.  Do not drink alcohol.  Take medicines only as directed by your health care provider. Contact a health care provider if: You have difficulty controlling your:  Blood sugar.  Cholesterol.  Alcohol consumption.  Get help right away if:  You have abdominal pain.  You have jaundice.  You have nausea and vomiting. This information is not intended to replace advice given to you by your health care provider. Make sure you discuss any questions you have with your health care provider. Document Released: 09/05/2005 Document Revised: 12/27/2015 Document Reviewed: 11/30/2013 Elsevier Interactive Patient Education  2018 [place].   Knee Pain, Adult Knee pain in adults is common. It can be caused by many things, including:  Arthritis.  A fluid-filled sac (cyst) or growth in your knee.  An infection in your knee.  An injury that will not heal.  Damage, swelling, or irritation of the tissues that support your knee.  Knee pain is usually not a sign of a serious  problem. The pain may go away on its own with time and rest. If it does not, a health care provider may order tests to find the cause of the pain. These may include:  Imaging tests, such as an X-ray, MRI, or ultrasound.  Joint aspiration. In this test, fluid is removed from the knee.  Arthroscopy. In this test, a lighted tube is inserted into knee and an image is projected onto a TV screen.  A biopsy. In this test, a  sample of tissue is removed from the body and studied under a microscope.  Follow these instructions at home: Pay attention to any changes in your symptoms. Take these actions to relieve your pain. Activity  Rest your knee.  Do not do things that cause pain or make pain worse.  Avoid high-impact activities or exercises, such as running, jumping rope, or doing jumping jacks. General instructions  Take over-the-counter and prescription medicines only as told by your health care provider.  Raise (elevate) your knee above the level of your heart when you are sitting or lying down.  Sleep with a pillow under your knee.  If directed, apply ice to the knee: ? Put ice in a plastic bag. ? Place a towel between your skin and the bag. ? Leave the ice on for 20 minutes, 2-3 times a day.  Ask your health care provider if you should wear an elastic knee support.  Lose weight if you are overweight. Extra weight can put pressure on your knee.  Do not use any products that contain nicotine or tobacco, such as cigarettes and e-cigarettes. Smoking may slow the healing of any bone and joint problems that you may have. If you need help quitting, ask your health care provider. Contact a health care provider if:  Your knee pain continues, changes, or gets worse.  You have a fever along with knee pain.  Your knee buckles or locks up.  Your knee swells, and the swelling becomes worse. Get help right away if:  Your knee feels warm to the touch.  You cannot move your knee.  You have severe pain in your knee.  You have chest pain.  You have trouble breathing. Summary  Knee pain in adults is common. It can be caused by many things, including, arthritis, infection, cysts, or injury.  Knee pain is usually not a sign of a serious problem, but if it does not go away, a health care provider may perform tests to know the cause of the pain.  Pay attention to any changes in your symptoms. Relieve  your pain with rest, medicines, light activity, and use of ice.  Get help if your pain continues or becomes very severe, or if your knee buckles or locks up, or if you have chest pain or trouble breathing. This information is not intended to replace advice given to you by your health care provider. Make sure you discuss any questions you have with your health care provider. Document Released: 05/18/2007 Document Revised: 07/11/2016 Document Reviewed: 07/11/2016 Elsevier Interactive Patient Education  [employer].

## 2018-01-05 NOTE — Progress Notes (Signed)
Pre visit review using our clinic review tool, if applicable. No additional management support is needed unless otherwise documented below in the visit note. 

## 2018-01-05 NOTE — Progress Notes (Signed)
Chief Complaint  Patient presents with  . Follow-up  . Diabetes   F/u  1. C/o b/l knee pain on the inside and upper thigh nothing tried  2. C/o numbness and tingling down both legs s/p back surgery  3. HTN elevated 182/82 repeat  178/80 she has not had all meds today and unable to tell me which ones she has had other than a "pink pill" 10/2017 outside visit BP 163/83 renal Dr. Johnney Ou and labs indicate she may have lupus so she saw Dr. Meda Coffee and due to f/u but  Has not sch titers were 1:1280 speckled and 1:160 nucleolar  -she had labs 10/2017 K 5.2 and BUN 46 and GFR 33 -she is on Clonidine 0.1 bid, lasix 40 mg qd, losartan 100 mg qd, procardia 30 mg XL We have not done BBs due to bradycardia HR 40s-50s at times and norvasc caused leg edema.  4. Large abdominal hernia to abdomen s/p repair no sx's.   Review of Systems  Constitutional: Negative for weight loss.  HENT: Negative for hearing loss.   Eyes: Negative for blurred vision.  Respiratory: Negative for shortness of breath.   Cardiovascular: Negative for chest pain.  Gastrointestinal: Negative for abdominal pain.  Musculoskeletal: Positive for joint pain.       B/l knee pain   Skin: Negative for rash.  Neurological: Positive for sensory change.  Psychiatric/Behavioral: Negative for depression.   Past Medical History:  Diagnosis Date  . Acute blood loss anemia 03/14/2017  . Acute renal failure superimposed on stage 3 chronic kidney disease ([place]) 03/14/2017  . AKI (acute kidney injury) ([place]) 02/28/2017  . Altered mental status 03/29/2017  . Anemia   . Aortic atherosclerosis ([place])   . Arthritis   . Bilateral lower extremity edema 03/04/2017  . Bradycardia   . Cataract   . Chronic kidney disease    ?? renal insufficiency,   . CKD (chronic kidney disease) stage 3, GFR 30-59 ml/min ([facility]) 01/15/2017   ?? renal insufficiency, which she thinks is coming from "all these medications"  . Diabetes mellitus without complication ([place])    diagnosed 4-5 yrs ago  . Disease of pancreas   . Diverticulitis    s/p perforation and partial colectomy 01/27/14 with 3 benign lymph nodes   . Diverticulosis 01/15/2017  . DJD (degenerative joint disease)   . DM (diabetes mellitus), type 2 with renal complications ([place]) 11/28/8339  . Fatty liver   . Hypertension   . Hypothyroidism   . Hypothyroidism    ? etiology autoimmune or acquired   . Kidney stone   . Lethargy 02/28/2017  . Obesity, Class III, BMI 40-49.9 (morbid obesity) ([place]) 02/27/2017  . Pleural lipoma   . Postoperative wound infection 04/02/2017  . Spinal stenosis of lumbar region 01/15/2017  . Status post lumbar surgery 02/24/2017  . Ventral hernia   . Wound healing, delayed    back   Past Surgical History:  Procedure Laterality Date  . ABDOMINAL EXPOSURE N/A 02/24/2017   Procedure: ABDOMINAL EXPOSURE;  Surgeon: Angelia Mould, MD;  Location: [facility];  Service: Vascular;  Laterality: N/A;  . ABDOMINAL HYSTERECTOMY  1987   no h/o abnormal paps   . ANTERIOR LAT LUMBAR FUSION N/A 02/24/2017   Procedure: Lumbar three- five Anterior lateral lumbar interbody fusion;  Surgeon: Ditty, Kevan Ny, MD;  Location: [facility];  Service: Neurosurgery;  Laterality: N/A;  L3-5 Anterior lateral lumbar interbody fusion with removal of coflex at L3-4, L4-5  . ANTERIOR  LUMBAR FUSION N/A 02/24/2017   Procedure: Stage 1: Lumbar five-Sacral one Anterior lumbar interbody fusion;  Surgeon: Ditty, Kevan Ny, MD;  Location: [facility];  Service: Neurosurgery;  Laterality: N/A;  Stage 1: L5-S1 Anterior lumbar interbody fusion  . APPENDECTOMY    . APPLICATION OF ROBOTIC ASSISTANCE FOR SPINAL PROCEDURE N/A 02/26/2017   Procedure: APPLICATION OF ROBOTIC ASSISTANCE FOR SPINAL PROCEDURE;  Surgeon: Ditty, Kevan Ny, MD;  Location: [facility];  Service: Neurosurgery;  Laterality: N/A;  . APPLICATION OF WOUND VAC N/A 03/30/2017   Procedure: APPLICATION OF WOUND VAC;  Surgeon: Ditty, Kevan Ny, MD;   Location: [facility];  Service: Neurosurgery;  Laterality: N/A;  . BACK SURGERY  2013  . BREAST SURGERY  1992   Breast Reduction  . CHOLECYSTECTOMY  1982  . COLON SURGERY  01/26/2014   desc.sigmoid colectomy and ventral hernia repair and splenic flexure mobilization  . COLON SURGERY    . EYE SURGERY Right 2017   cataracts  . [place]  . LUMBAR FUSION  02/24/2017  . LUMBAR LAMINECTOMY WITH SPINOUS PROCESS PLATE 2 LEVEL N/A 2/44/9753   Procedure: LUMBAR LAMINECTOMY/DECOMPRESSION MICRODISCECTOMY CoFlex;  Surgeon: Faythe Ghee, MD;  Location: [facility];  Service: Neurosurgery;  Laterality: N/A;  Lumbar three-four,Lumbar Four-Five Laminectomy with Coflex  . LUMBAR WOUND DEBRIDEMENT N/A 03/30/2017   Procedure: Lumbar wound exploration/debridement, placement of wound vac;  Surgeon: Ditty, Kevan Ny, MD;  Location: [facility];  Service: Neurosurgery;  Laterality: N/A;  Lumbar wound exploration/debridement, placement of wound vac  . PARTIAL COLECTOMY     01/27/14 diverticulitis and 3 benign lymph nodes [facility] Dr. Pat Patrick   . VENTRAL HERNIA REPAIR     Family History  Problem Relation Age of Onset  . Diabetes Father   . Heart attack Father   . Hypertension Father   . Heart disease Father   . Kidney disease Father   . Diabetes Mother   . Hypertension Mother   . Heart disease Mother   . Kidney disease Mother   . Lupus Sister   . Heart disease Brother   . Diabetes Brother   . Heart disease Brother   . Diabetes Brother   . Lupus Sister    Social History   Socioeconomic History  . Marital status: Widowed    Spouse name: Not on file  . Number of children: Not on file  . Years of education: Not on file  . Highest education level: Not on file  Occupational History  . Occupation: retired Economist work  Scientific laboratory technician  . Financial resource strain: Not hard at all  . Food insecurity:    Worry: Never true    Inability: Never true  . Transportation needs:    Medical: No     Non-medical: No  Tobacco Use  . Smoking status: Never Smoker  . Smokeless tobacco: Never Used  Substance and Sexual Activity  . Alcohol use: No  . Drug use: No  . Sexual activity: Never  Lifestyle  . Physical activity:    Days per week: 0 days    Minutes per session: Not on file  . Stress: Not on file  Relationships  . Social connections:    Talks on phone: Not on file    Gets together: Not on file    Attends religious service: Not on file    Active member of club or organization: Not on file    Attends meetings of clubs or organizations: Not on file  Relationship status: Not on file  . Intimate partner violence:    Fear of current or ex partner: Not on file    Emotionally abused: Not on file    Physically abused: Not on file    Forced sexual activity: Not on file  Other Topics Concern  . Not on file  Social History Narrative   Admitted to [facility] 03/03/17   Widowed since 2012    2 sons [name] comes to appts)    Never smoked   Alcohol none   Full Code   12th grade education retired    Current Meds  Medication Sig  . acetaminophen (TYLENOL) 500 MG tablet Take 2 tablets (1,000 mg total) by mouth 3 (three) times daily.  Marland Kitchen atorvastatin (LIPITOR) 40 MG tablet Take 1 tablet (40 mg total) by mouth every evening.  . baclofen 5 MG TABS Take 5 mg by mouth 2 (two) times daily as needed for muscle spasms.  . blood glucose meter kit and supplies KIT Dispense based on patient and insurance preference. Use up to four times daily as directed. (FOR ICD-9 250.00, 250.01).  . Cholecalciferol 1000 units capsule Take 1 capsule (1,000 Units total) by mouth daily.  . cloNIDine (CATAPRES) 0.1 MG tablet Take 1 tablet (0.1 mg total) by mouth 2 (two) times daily.  . ferrous sulfate 325 (65 FE) MG tablet Take 1 tablet (325 mg total) by mouth daily with lunch.  . furosemide (LASIX) 40 MG tablet Take 1 tablet (40 mg total) by mouth 2 (two) times daily.  Marland Kitchen  HYDROcodone-acetaminophen (NORCO/VICODIN) 5-325 MG tablet Take 1 tablet by mouth. Take one tablet 30 minutes before treatment  . levothyroxine (SYNTHROID, LEVOTHROID) 100 MCG tablet Take 1 tablet (100 mcg total) by mouth daily before breakfast.  . losartan (COZAAR) 100 MG tablet Take 1 tablet (100 mg total) by mouth daily after breakfast.  . Magnesium Oxide 400 MG CAPS Take 1 capsule (400 mg total) by mouth daily.  Marland Kitchen NIFEdipine (PROCARDIA-XL/ADALAT CC) 30 MG 24 hr tablet Take 1 tablet (30 mg total) by mouth daily.  . pantoprazole (PROTONIX) 40 MG tablet Take 1 tablet (40 mg total) by mouth at bedtime.  . vitamin C (ASCORBIC ACID) 500 MG tablet Take 500 mg by mouth. Take one tablet twice daily to aid wound healing   Allergies  Allergen Reactions  . Clams [Shellfish Allergy] Swelling and Other (See Comments)    THROAT SWELLS NECK TURNS RED  . Hydralazine Other (See Comments)    CHEST TIGHTNESS  . Milk-Related Compounds Diarrhea   No results found for this or any previous visit (from the past 2160 hour(s)). Objective  Body mass index is 38.08 kg/m. Wt Readings from Last 3 Encounters:  01/05/18 208 lb 3.2 oz (94.4 kg)  12/01/17 204 lb 6.4 oz (92.7 kg)  09/07/17 196 lb 6.4 oz (89.1 kg)   Temp Readings from Last 3 Encounters:  01/05/18 98 F (36.7 C) (Oral)  12/01/17 98.3 F (36.8 C) (Oral)  09/07/17 98.1 F (36.7 C) (Oral)   BP Readings from Last 3 Encounters:  01/05/18 (!) 182/82  12/01/17 138/74  09/07/17 124/64   Pulse Readings from Last 3 Encounters:  01/05/18 77  12/01/17 (!) 58  09/07/17 (!) 50    Physical Exam  Constitutional: She is oriented to person, place, and time. She appears well-developed and well-nourished. She is cooperative.  HENT:  Head: Normocephalic and atraumatic.  Mouth/Throat: Oropharynx is clear and moist and mucous membranes  are normal.  Eyes: Pupils are equal, round, and reactive to light. Conjunctivae are normal.  Cardiovascular: Normal rate,  regular rhythm and normal heart sounds.  Pulmonary/Chest: Effort normal and breath sounds normal.  Musculoskeletal: She exhibits tenderness.       Right shoulder: She exhibits tenderness and laceration.  Medial knee pain b/l   Neurological: She is alert and oriented to person, place, and time. Gait normal.  Skin: Skin is warm, dry and intact.  Psychiatric: She has a normal mood and affect. Her speech is normal and behavior is normal. Judgment and thought content normal. Cognition and memory are normal.  Nursing note and vitals reviewed.   Assessment   1. B/l knee pain  2. HTN with titers autoimmune I.e lupus elevated  3. Lumbar radiculopathy vs neuropathy s/p back surgery  4. Large ventral hernia  5. HM Plan   1. voltaren gel  Consider ortho in future  Prn Tylenol  Xray b/l knees today  2. Cont meds  Will send Dr. Johnney Ou my note see HPI have not done BB due to h/o bradycardia and norvasc caused leg edema in the past  Encouraged pt to sch f/u with rheumatology Pt needs to sch eye exam in future  3. Add gabapentin 300 mg qhs  4. montior no sx's for now  5.  Labs today  Had flu shot  Tdap had 08/08/08, pna 23 2004,08/08/08, prevnar, zostavax had 08/15/08  -shingrix consider in future   mammo 08/20/17 neg  Colonoscopy had 2015 Dr. Pat Patrick [facility] need to get records or review [facility] records Dr. Bary Castilla 2013  Out of age window pap sp hysterectomy 1987 DEXA per Dr. Ronnald Collum 05/14/15 and 01/2017 unable to read requested mailed copy h/o osteopenia/osteoporosis  -saw copy 05/14/15 mild osteopenia left femoral neck   Neg hep C. rec hep B vaccine not immune will rec twinrix in future h/o fatty liver    Provider: Dr. Olivia Mackie McLean-Scocuzza-Internal Medicine

## 2018-01-07 ENCOUNTER — Telehealth: Payer: Self-pay | Admitting: Internal Medicine

## 2018-01-07 NOTE — Telephone Encounter (Signed)
She would have to see ortho  Does she want referral?   [place]

## 2018-01-07 NOTE — Telephone Encounter (Signed)
Copied from [place] [id number]. Topic: Quick Communication - See Telephone Encounter >> Jan 07, 2018  9:55 AM Keene Breath wrote: CRM for notification. See Telephone encounter for: 01/07/18.  Patient called to see if doctor could have her get shots in her knee for pain instead of the medication she is currently taking.  It was suggested by one of her relatives to ask the doctor if that was an option for her.

## 2018-01-08 ENCOUNTER — Encounter: Payer: Self-pay | Admitting: Internal Medicine

## 2018-01-08 DIAGNOSIS — E876 Hypokalemia: Secondary | ICD-10-CM | POA: Insufficient documentation

## 2018-01-08 DIAGNOSIS — G629 Polyneuropathy, unspecified: Secondary | ICD-10-CM | POA: Insufficient documentation

## 2018-01-08 NOTE — Progress Notes (Signed)
Sent records electronically

## 2018-01-08 NOTE — Telephone Encounter (Signed)
She does want the referral to ortho.

## 2018-01-11 ENCOUNTER — Telehealth: Payer: Self-pay

## 2018-01-11 NOTE — Telephone Encounter (Signed)
Copied from [place] [phone]. Topic: Referral - Request >> Jan 11, 2018 10:01 AM Keene Breath wrote: Reason for CRM: Patient call to request an ortho doctor in [place], [place].  She was offered to go to the ortho doctors in [place], but preferred to go to someone closer to her home in [place].  CB [phone].

## 2018-01-12 ENCOUNTER — Other Ambulatory Visit: Payer: Self-pay | Admitting: Internal Medicine

## 2018-01-12 DIAGNOSIS — M1710 Unilateral primary osteoarthritis, unspecified knee: Secondary | ICD-10-CM

## 2018-01-12 NOTE — Telephone Encounter (Signed)
I do not have a referral for Ortho. Please advise.

## 2018-01-12 NOTE — Telephone Encounter (Signed)
FAX Xrays b/l knees refer to Dr. Marry Guan   Thanks [employer]

## 2018-01-25 ENCOUNTER — Other Ambulatory Visit: Payer: Self-pay | Admitting: Internal Medicine

## 2018-01-25 ENCOUNTER — Telehealth: Payer: Self-pay | Admitting: Internal Medicine

## 2018-01-25 DIAGNOSIS — I10 Essential (primary) hypertension: Secondary | ICD-10-CM

## 2018-01-25 DIAGNOSIS — R609 Edema, unspecified: Secondary | ICD-10-CM

## 2018-01-25 DIAGNOSIS — E039 Hypothyroidism, unspecified: Secondary | ICD-10-CM

## 2018-01-25 MED ORDER — LEVOTHYROXINE SODIUM 100 MCG PO TABS: 100.0000 ug | ORAL_TABLET | Freq: Every day | ORAL | 1 refills | Status: DC

## 2018-01-25 MED ORDER — FUROSEMIDE 40 MG PO TABS: 40.0000 mg | ORAL_TABLET | Freq: Every day | ORAL | 1 refills | Status: DC

## 2018-01-25 NOTE — Telephone Encounter (Signed)
Please call pt I want to reduce lasix 40 mg 2x per day to 1x per day sent refill in as that   Let me know if leg swelling worse or blood pressure worse we will make adjustments then but want to try to reduce amt of fluid pill for now   [place]

## 2018-01-25 NOTE — Telephone Encounter (Signed)
Also please sch f/u in the next 2 weeks to check on her blood pressure and make sure she takes all meds 2 hours before appt    [place]

## 2018-01-27 DIAGNOSIS — M1711 Unilateral primary osteoarthritis, right knee: Secondary | ICD-10-CM | POA: Diagnosis not present

## 2018-01-27 DIAGNOSIS — G8929 Other chronic pain: Secondary | ICD-10-CM | POA: Diagnosis not present

## 2018-01-27 DIAGNOSIS — M25561 Pain in right knee: Secondary | ICD-10-CM | POA: Diagnosis not present

## 2018-01-27 DIAGNOSIS — M1712 Unilateral primary osteoarthritis, left knee: Secondary | ICD-10-CM | POA: Diagnosis not present

## 2018-01-27 DIAGNOSIS — M25562 Pain in left knee: Secondary | ICD-10-CM | POA: Diagnosis not present

## 2018-01-28 ENCOUNTER — Ambulatory Visit (INDEPENDENT_AMBULATORY_CARE_PROVIDER_SITE_OTHER): Payer: Self-pay | Admitting: Orthopaedic Surgery

## 2018-01-28 NOTE — Telephone Encounter (Signed)
Sent Mychart message to inform patient.

## 2018-01-29 ENCOUNTER — Telehealth: Payer: Self-pay | Admitting: Internal Medicine

## 2018-01-29 ENCOUNTER — Other Ambulatory Visit: Payer: Self-pay | Admitting: Internal Medicine

## 2018-01-29 DIAGNOSIS — I10 Essential (primary) hypertension: Secondary | ICD-10-CM

## 2018-01-29 DIAGNOSIS — E039 Hypothyroidism, unspecified: Secondary | ICD-10-CM

## 2018-01-29 MED ORDER — NIFEDIPINE ER 30 MG PO TB24: 30.0000 mg | ORAL_TABLET | Freq: Every day | ORAL | 0 refills | Status: DC

## 2018-01-29 MED ORDER — LEVOTHYROXINE SODIUM 100 MCG PO TABS: 100.0000 ug | ORAL_TABLET | Freq: Every day | ORAL | 3 refills | Status: DC

## 2018-01-29 MED ORDER — CLONIDINE HCL 0.1 MG PO TABS: 0.1000 mg | ORAL_TABLET | Freq: Two times a day (BID) | ORAL | 0 refills | Status: DC

## 2018-01-29 NOTE — Telephone Encounter (Signed)
2nd notification to call patient to sch f/u with PCP before 04/2018 for blood pressure f/u  Please make sure takes medication 2 hours before appt   Fax note to [facility] renal [place] Dr. Johnney Ou attn or who ever involved in pt care there     [place]

## 2018-02-01 NOTE — Telephone Encounter (Signed)
Note has been faxed.

## 2018-02-02 NOTE — Telephone Encounter (Signed)
mychart message has been sent to patient and message was seen by patient.

## 2018-02-03 DIAGNOSIS — R208 Other disturbances of skin sensation: Secondary | ICD-10-CM | POA: Diagnosis not present

## 2018-02-03 DIAGNOSIS — R768 Other specified abnormal immunological findings in serum: Secondary | ICD-10-CM | POA: Diagnosis not present

## 2018-02-03 DIAGNOSIS — R8290 Unspecified abnormal findings in urine: Secondary | ICD-10-CM | POA: Diagnosis not present

## 2018-02-03 DIAGNOSIS — R7309 Other abnormal glucose: Secondary | ICD-10-CM | POA: Diagnosis not present

## 2018-02-08 ENCOUNTER — Other Ambulatory Visit: Payer: Self-pay | Admitting: Internal Medicine

## 2018-02-08 MED ORDER — MAGNESIUM OXIDE 400 MG PO CAPS: 400.0000 mg | ORAL_CAPSULE | Freq: Every day | ORAL | 3 refills | Status: DC

## 2018-02-11 ENCOUNTER — Other Ambulatory Visit: Payer: Self-pay | Admitting: Internal Medicine

## 2018-02-11 DIAGNOSIS — I10 Essential (primary) hypertension: Secondary | ICD-10-CM

## 2018-02-11 DIAGNOSIS — E785 Hyperlipidemia, unspecified: Secondary | ICD-10-CM

## 2018-02-11 MED ORDER — LOSARTAN POTASSIUM 100 MG PO TABS: 100.0000 mg | ORAL_TABLET | Freq: Every day | ORAL | 1 refills | Status: DC

## 2018-02-11 MED ORDER — ATORVASTATIN CALCIUM 40 MG PO TABS: 40.0000 mg | ORAL_TABLET | Freq: Every evening | ORAL | 1 refills | Status: DC

## 2018-02-17 DIAGNOSIS — I10 Essential (primary) hypertension: Secondary | ICD-10-CM | POA: Diagnosis not present

## 2018-02-17 DIAGNOSIS — R8290 Unspecified abnormal findings in urine: Secondary | ICD-10-CM | POA: Diagnosis not present

## 2018-02-17 DIAGNOSIS — E782 Mixed hyperlipidemia: Secondary | ICD-10-CM | POA: Diagnosis not present

## 2018-02-17 DIAGNOSIS — R001 Bradycardia, unspecified: Secondary | ICD-10-CM | POA: Diagnosis not present

## 2018-02-17 DIAGNOSIS — R768 Other specified abnormal immunological findings in serum: Secondary | ICD-10-CM | POA: Diagnosis not present

## 2018-02-23 ENCOUNTER — Ambulatory Visit (INDEPENDENT_AMBULATORY_CARE_PROVIDER_SITE_OTHER): Payer: Medicare Other | Admitting: Internal Medicine

## 2018-02-23 ENCOUNTER — Encounter: Payer: Self-pay | Admitting: Internal Medicine

## 2018-02-23 VITALS — BP 136/84 | HR 58 | Temp 97.9°F | Ht 62.0 in | Wt 218.8 lb

## 2018-02-23 DIAGNOSIS — E039 Hypothyroidism, unspecified: Secondary | ICD-10-CM | POA: Diagnosis not present

## 2018-02-23 DIAGNOSIS — E1122 Type 2 diabetes mellitus with diabetic chronic kidney disease: Secondary | ICD-10-CM | POA: Diagnosis not present

## 2018-02-23 DIAGNOSIS — N183 Chronic kidney disease, stage 3 unspecified: Secondary | ICD-10-CM

## 2018-02-23 DIAGNOSIS — I10 Essential (primary) hypertension: Secondary | ICD-10-CM

## 2018-02-23 DIAGNOSIS — R001 Bradycardia, unspecified: Secondary | ICD-10-CM | POA: Diagnosis not present

## 2018-02-23 LAB — POCT GLYCOSYLATED HEMOGLOBIN (HGB A1C): HbA1c, POC (prediabetic range): 5.7 % (ref 5.7–6.4)

## 2018-02-23 NOTE — Progress Notes (Addendum)
Chief Complaint  Patient presents with  . Follow-up  . Hypertension   F/u  HTN doing better cards Dr. Clayborn Bigness d/c nifedipine xl 30 and put on labetalol 100 mg bid HR is 59 today with h/o bradycardia w/o sx's. Doing well today   Weight gain 10 lbs since last visit disc exercise to lose would hold metformin due to GFR 43.56 last checked. H/o DM 2 last A1C 07/2017 6.1 recheck today   Review of Systems  Constitutional: Negative for weight loss.  HENT: Negative for hearing loss.   Eyes: Negative for blurred vision.  Respiratory: Negative for shortness of breath.   Cardiovascular: Negative for leg swelling.  Gastrointestinal: Negative for abdominal pain.  Skin: Negative for rash.  Neurological: Positive for sensory change.  Psychiatric/Behavioral: Negative for depression.   Past Medical History:  Diagnosis Date  . Acute blood loss anemia 03/14/2017  . Acute renal failure superimposed on stage 3 chronic kidney disease ([place]) 03/14/2017  . AKI (acute kidney injury) ([place]) 02/28/2017  . Altered mental status 03/29/2017  . Anemia   . Aortic atherosclerosis ([place])   . Arthritis   . Bilateral lower extremity edema 03/04/2017  . Bradycardia   . Cataract   . Chronic kidney disease    ?? renal insufficiency,   . CKD (chronic kidney disease) stage 3, GFR 30-59 ml/min ([facility]) 01/15/2017   ?? renal insufficiency, which she thinks is coming from "all these medications"  . Diabetes mellitus without complication ([place])    diagnosed 4-5 yrs ago  . Disease of pancreas   . Diverticulitis    s/p perforation and partial colectomy 01/27/14 with 3 benign lymph nodes   . Diverticulosis 01/15/2017  . DJD (degenerative joint disease)   . DM (diabetes mellitus), type 2 with renal complications ([place]) 1/93/7902  . Fatty liver   . Hypertension   . Hypothyroidism   . Hypothyroidism    ? etiology autoimmune or acquired   . Kidney stone   . Lethargy 02/28/2017  . Obesity, Class III, BMI 40-49.9 (morbid obesity) ([name])  02/27/2017  . Pleural lipoma   . Postoperative wound infection 04/02/2017  . Spinal stenosis of lumbar region 01/15/2017  . Status post lumbar surgery 02/24/2017  . Ventral hernia   . Wound healing, delayed    back   Past Surgical History:  Procedure Laterality Date  . ABDOMINAL EXPOSURE N/A 02/24/2017   Procedure: ABDOMINAL EXPOSURE;  Surgeon: Angelia Mould, MD;  Location: [place];  Service: Vascular;  Laterality: N/A;  . ABDOMINAL HYSTERECTOMY  1987   no h/o abnormal paps   . ANTERIOR LAT LUMBAR FUSION N/A 02/24/2017   Procedure: Lumbar three- five Anterior lateral lumbar interbody fusion;  Surgeon: Ditty, Kevan Ny, MD;  Location: [facility];  Service: Neurosurgery;  Laterality: N/A;  L3-5 Anterior lateral lumbar interbody fusion with removal of coflex at L3-4, L4-5  . ANTERIOR LUMBAR FUSION N/A 02/24/2017   Procedure: Stage 1: Lumbar five-Sacral one Anterior lumbar interbody fusion;  Surgeon: Ditty, Kevan Ny, MD;  Location: [place];  Service: Neurosurgery;  Laterality: N/A;  Stage 1: L5-S1 Anterior lumbar interbody fusion  . APPENDECTOMY    . APPLICATION OF ROBOTIC ASSISTANCE FOR SPINAL PROCEDURE N/A 02/26/2017   Procedure: APPLICATION OF ROBOTIC ASSISTANCE FOR SPINAL PROCEDURE;  Surgeon: Ditty, Kevan Ny, MD;  Location: [place];  Service: Neurosurgery;  Laterality: N/A;  . APPLICATION OF WOUND VAC N/A 03/30/2017   Procedure: APPLICATION OF WOUND VAC;  Surgeon: Ditty, Kevan Ny, MD;  Location:  [facility];  Service: Neurosurgery;  Laterality: N/A;  . BACK SURGERY  2013  . BREAST SURGERY  1992   Breast Reduction  . CHOLECYSTECTOMY  1982  . COLON SURGERY  01/26/2014   desc.sigmoid colectomy and ventral hernia repair and splenic flexure mobilization  . COLON SURGERY    . EYE SURGERY Right 2017   cataracts  . [place]  . LUMBAR FUSION  02/24/2017  . LUMBAR LAMINECTOMY WITH SPINOUS PROCESS PLATE 2 LEVEL N/A 08/28/5807   Procedure: LUMBAR  LAMINECTOMY/DECOMPRESSION MICRODISCECTOMY CoFlex;  Surgeon: Faythe Ghee, MD;  Location: [facility];  Service: Neurosurgery;  Laterality: N/A;  Lumbar three-four,Lumbar Four-Five Laminectomy with Coflex  . LUMBAR WOUND DEBRIDEMENT N/A 03/30/2017   Procedure: Lumbar wound exploration/debridement, placement of wound vac;  Surgeon: Ditty, Kevan Ny, MD;  Location: [place];  Service: Neurosurgery;  Laterality: N/A;  Lumbar wound exploration/debridement, placement of wound vac  . PARTIAL COLECTOMY     01/27/14 diverticulitis and 3 benign lymph nodes [facility] Dr. Pat Patrick   . VENTRAL HERNIA REPAIR     Family History  Problem Relation Age of Onset  . Diabetes Father   . Heart attack Father   . Hypertension Father   . Heart disease Father   . Kidney disease Father   . Diabetes Mother   . Hypertension Mother   . Heart disease Mother   . Kidney disease Mother   . Lupus Sister   . Heart disease Brother   . Diabetes Brother   . Heart disease Brother   . Diabetes Brother   . Lupus Sister    Social History   Socioeconomic History  . Marital status: Widowed    Spouse name: Not on file  . Number of children: Not on file  . Years of education: Not on file  . Highest education level: Not on file  Occupational History  . Occupation: retired Economist work  Scientific laboratory technician  . Financial resource strain: Not hard at all  . Food insecurity:    Worry: Never true    Inability: Never true  . Transportation needs:    Medical: No    Non-medical: No  Tobacco Use  . Smoking status: Never Smoker  . Smokeless tobacco: Never Used  Substance and Sexual Activity  . Alcohol use: No  . Drug use: No  . Sexual activity: Never  Lifestyle  . Physical activity:    Days per week: 0 days    Minutes per session: Not on file  . Stress: Not on file  Relationships  . Social connections:    Talks on phone: Not on file    Gets together: Not on file    Attends religious service: Not on file    Active member of club or  organization: Not on file    Attends meetings of clubs or organizations: Not on file    Relationship status: Not on file  . Intimate partner violence:    Fear of current or ex partner: Not on file    Emotionally abused: Not on file    Physically abused: Not on file    Forced sexual activity: Not on file  Other Topics Concern  . Not on file  Social History Narrative   Admitted to [facility] 03/03/17   Widowed since 2012    2 sons [name] comes to appts)    Never smoked   Alcohol none   Full Code   12th grade education retired  Current Meds  Medication Sig  . acetaminophen (TYLENOL) 500 MG tablet Take 2 tablets (1,000 mg total) by mouth 3 (three) times daily.  Marland Kitchen atorvastatin (LIPITOR) 40 MG tablet Take 1 tablet (40 mg total) by mouth every evening.  . blood glucose meter kit and supplies KIT Dispense based on patient and insurance preference. Use up to four times daily as directed. (FOR ICD-9 250.00, 250.01).  . Cholecalciferol 1000 units capsule Take 1 capsule (1,000 Units total) by mouth daily.  . cloNIDine (CATAPRES) 0.1 MG tablet Take 1 tablet (0.1 mg total) by mouth 2 (two) times daily.  . diclofenac sodium (VOLTAREN) 1 % GEL Apply 4 g topically 4 (four) times daily. Knees b/l  . ferrous sulfate 325 (65 FE) MG tablet Take 1 tablet (325 mg total) by mouth daily with lunch.  . furosemide (LASIX) 40 MG tablet Take 1 tablet (40 mg total) by mouth daily. In am  . gabapentin (NEURONTIN) 300 MG capsule Take 1 capsule (300 mg total) by mouth at bedtime.  Marland Kitchen labetalol (NORMODYNE) 100 MG tablet Take by mouth.  . levothyroxine (SYNTHROID, LEVOTHROID) 100 MCG tablet Take 1 tablet (100 mcg total) by mouth daily before breakfast.  . losartan (COZAAR) 100 MG tablet Take 1 tablet (100 mg total) by mouth daily after breakfast.  . Magnesium Oxide 400 MG CAPS Take 1 capsule (400 mg total) by mouth daily.  . vitamin C (ASCORBIC ACID) 500 MG tablet Take 500 mg by mouth. Take one tablet  twice daily to aid wound healing   Allergies  Allergen Reactions  . Clams [Shellfish Allergy] Swelling and Other (See Comments)    THROAT SWELLS NECK TURNS RED  . Hydralazine Other (See Comments)    CHEST TIGHTNESS  . Milk-Related Compounds Diarrhea   Recent Results (from the past 2160 hour(s))  Basic Metabolic Panel (BMET)     Status: Abnormal   Collection Time: 01/05/18 11:09 AM  Result Value Ref Range   Sodium 139 135 - 145 mEq/L   Potassium 4.1 3.5 - 5.1 mEq/L   Chloride 105 96 - 112 mEq/L   CO2 28 19 - 32 mEq/L   Glucose, Bld 76 70 - 99 mg/dL   BUN 45 (H) 6 - 23 mg/dL   Creatinine, Ser 1.50 (H) 0.40 - 1.20 mg/dL   Calcium 9.6 8.4 - 10.5 mg/dL   GFR 43.56 (L) >60.00 mL/min  CBC with Differential/Platelet     Status: Abnormal   Collection Time: 01/05/18 11:09 AM  Result Value Ref Range   WBC 6.3 4.0 - 10.5 K/uL   RBC 3.75 (L) 3.87 - 5.11 Mil/uL   Hemoglobin 11.2 (L) 12.0 - 15.0 g/dL   HCT 33.5 (L) 36.0 - 46.0 %   MCV 89.4 78.0 - 100.0 fl   MCHC 33.5 30.0 - 36.0 g/dL   RDW 13.5 11.5 - 15.5 %   Platelets 312.0 150.0 - 400.0 K/uL   Neutrophils Relative % 67.0 43.0 - 77.0 %   Lymphocytes Relative 22.6 12.0 - 46.0 %   Monocytes Relative 9.2 3.0 - 12.0 %   Eosinophils Relative 0.7 0.0 - 5.0 %   Basophils Relative 0.5 0.0 - 3.0 %   Neutro Abs 4.2 1.4 - 7.7 K/uL   Lymphs Abs 1.4 0.7 - 4.0 K/uL   Monocytes Absolute 0.6 0.1 - 1.0 K/uL   Eosinophils Absolute 0.0 0.0 - 0.7 K/uL   Basophils Absolute 0.0 0.0 - 0.1 K/uL  Iron, TIBC and Ferritin Panel  Status: None   Collection Time: 01/05/18 11:09 AM  Result Value Ref Range   Iron 82 45 - 160 mcg/dL   TIBC 271 250 - 450 mcg/dL (calc)   %SAT 30 11 - 50 % (calc)   Ferritin 276 20 - 288 ng/mL  Magnesium     Status: None   Collection Time: 01/05/18 11:09 AM  Result Value Ref Range   Magnesium 2.2 1.5 - 2.5 mg/dL   Objective  Body mass index is 40.02 kg/m. Wt Readings from Last 3 Encounters:  02/23/18 218 lb 12.8 oz  (99.2 kg)  01/05/18 208 lb 3.2 oz (94.4 kg)  12/01/17 204 lb 6.4 oz (92.7 kg)   Temp Readings from Last 3 Encounters:  02/23/18 97.9 F (36.6 C) (Oral)  01/05/18 98 F (36.7 C) (Oral)  12/01/17 98.3 F (36.8 C) (Oral)   BP Readings from Last 3 Encounters:  02/23/18 136/84  01/05/18 (!) 182/82  12/01/17 138/74   Pulse Readings from Last 3 Encounters:  02/23/18 (!) 58  01/05/18 77  12/01/17 (!) 58    Physical Exam  Constitutional: She is oriented to person, place, and time. Vital signs are normal. She appears well-developed and well-nourished. She is cooperative.  HENT:  Head: Normocephalic and atraumatic.  Mouth/Throat: Oropharynx is clear and moist and mucous membranes are normal.  Eyes: Pupils are equal, round, and reactive to light. Conjunctivae are normal.  Cardiovascular: Normal rate, regular rhythm and normal heart sounds.  Pulmonary/Chest: Effort normal and breath sounds normal.  Neurological: She is alert and oriented to person, place, and time. Gait normal.  Skin: Skin is warm, dry and intact.  Psychiatric: She has a normal mood and affect. Her speech is normal and behavior is normal. Judgment and thought content normal. Cognition and memory are normal.  Nursing note and vitals reviewed.   Assessment   1. HTN controlled  2. Bradycardia 3. H/o DM 2 last A1C 6.1 07/2017 A1C today 5.7 POC 4. CKD 3 ANA + but ds DNA neg with Dr. Meda Coffee  5. HM Plan  1. Cont losartan 100, clonidine 0.1 bid, lasix 40 mg qd, off procardia xl 30 cards Dr. Clayborn Bigness put on labetalol 100 mg bid monitor HR  2. See #1  3. POC A1C today  rec hold metformin due to GFR last 43.56  4. F/u rheumatology in 6 months saw 02/03/18  Renal appt [facility] [place] neck week  5.  Had flu shot Tdaphad 08/08/08, pna [id number],08/08/08,  prevnar,zostavax had 08/15/08  -shingrix and pna 23 consider in future   mammo1/17/19 neg Colonoscopy had 2015 Dr. Pat Patrick [facility] need to get recordsor  review [place] recordsDr. Bary Castilla 2013 -see report 01/27/14 diverticulitis consider repeat colonoscopy 2020 vs cologaurd in future  Out of age window papsp hysterectomy 1987 DEXAper Dr. Ronnald Collum 05/14/15 and 01/2017 unable to read requested mailed copy h/o osteopenia/osteoporosis  -saw copy 05/14/15 mild osteopenia left femoral neck -consider repeat DEXA in future   Neg hep C.  rec hep B vaccine notimmunewill rec twinrix in future h/o fatty liver    Reviewed renal note 04/07/18 they were unclear of med list so faxed updated list [facility] Renal Dr. Cecile Hearing. BP was elevated in visit, legs swollen per pt though not significant on exam per note CKD 3 thought 2/2 HTN nephrosclerosis [facility] Creatinine 1.2-1.5 with proteinuria nephrotic range renal bx not rec. SPEP/UPEP negative  -they rec clonidine patch if not taking meds reliably  -[facility] nifedipine and norvasc stopped  2/2 leg edema and consider changing lasix to chlorthalidone  F/u in 4-6 months    Provider: Dr. Olivia Mackie McLean-Scocuzza-[facility] Medicine

## 2018-02-23 NOTE — Patient Instructions (Addendum)
F/u as scheduled  A1C 5.7 today   Eating Healthy on a Budget There are many ways to save money at the grocery store and continue to eat healthy. You can be successful if you plan your meals according to your budget, purchase according to your budget and grocery list, and prepare food yourself. How can I buy more food on a limited budget? Plan  Plan meals and snacks according to a grocery list and budget you create.  Look for recipes where you can cook once and make enough food for two meals.  Include meals that will "stretch" more expensive foods such as stews, casseroles, and stir-fry dishes.  Make a grocery list and make sure to bring it with you to the store. If you have a smart phone, you could use your phone to create your shopping list. Purchase  When grocery shopping, buy only the items on your grocery list and go only to the areas of the store that have the items on your list. Prepare  Some meal items can be prepared in advance. Pre-cook on days when you have extra time.  Make extra food (such as by doubling recipes) and freeze the extras in meal-sized containers or in individual portions for fast meals and snacks.  Use leftovers in your meal plan for the week.  Try some meatless meals or try "no cook" meals like salads.  When you come home from the grocery store, wash and prepare your fruits and vegetables so they are ready to use and eat. This will help reduce food waste. How can I buy more food on a limited budget? Try these tips the next time you go shopping:  [place] store brands or generic brands.  Use coupons only for foods and brands you normally buy. Avoid buying items you wouldn't normally buy simply because they are on sale.  Check online and in newspapers for weekly deals.  Buy healthy items from the bulk bins when available, such as herbs, spices, flours, pastas, nuts, and dried fruit.  Buy fruits and vegetables that are in season. Prices are usually lower on  in-season produce.  Compare and contrast different items. You can do this by looking at the unit price on the price tag. Use it to compare different brands and sizes to find out which item is the best deal.  Choose naturally low-cost healthy items, such as carrots, potatoes, apples, bananas, and oranges. Dried or canned beans are a low-cost protein source.  Buy in bulk and freeze extra food. Items you can buy in bulk include meats, fish, poultry, frozen fruits, and frozen vegetables.  Limit the purchase of prepared or "ready-to-eat" foods, such as pre-cut fruits and vegetables and pre-made salads.  If possible, shop around to discover which grocery store offers the best prices. Some stores charge much more than other stores for the same items.  Do not shop when you are hungry. If you shop while hungry, It may be hard to stick to your list and budget.  Stick to your list and resist impulse buys. Treat your list as your official plan for the week.  Buy a variety of vegetables and fruit by purchasing fresh, frozen, and canned items.  Look beyond eye level. Foods at eye level (adult or child eye level) are more expensive. Look at the top and bottom shelves for deals.  Be efficient with your time when shopping. The more time you spend at the store, the more money you are likely to spend.  Consider other retailers such as dollar stores, larger [employer], local fruit and vegetable stands, and farmers markets.  What are some tips for less expensive food substitutions? When choosing more expensive foods like meats and dairy, try these tips to save money:  Choose cheaper cuts of meat, such as bone-in chicken thighs and drumsticks instead skinless and boneless chicken. When you are ready to prepare the chicken, you can remove the skin yourself to make it healthier.  Choose lean meats like chicken or [place]. When choosing ground beef, make sure it is lean ground beef (92% lean, 8% fat). If  you do buy a fattier ground beef, drain the fat before eating.  Buy dried beans and peas, such as lentils, split peas, or kidney beans.  For seafood, choose canned tuna, salmon, or sardines.  Eggs are a low-cost source of protein.  Buy the larger tubs of yogurt instead of individual-sized containers.  Choose water instead of sodas and other sweetened beverages.  Skip buying chips, cookies, and other "junk food". These items are usually expensive, high in calories, and low in nutritional value.  How can I prepare the foods I buy in the healthiest way? Practice these tips for cooking foods in the healthiest way to reduce excess fat and calorie intake:  Steam, saute, grill, or bake foods instead of frying them.  Make sure half your plate is filled with fruits or vegetables. Choose from fresh, frozen, or canned fruits and vegetables. If eating canned, remember to rinse them before eating. This will remove any excess salt added for packaging.  Trim all fat from meat before cooking. Remove the skin from chicken or [place].  Spoon off fat from meat dishes once they have been chilled in the refrigerator and the fat has hardened on the top.  Use skim milk, low-fat milk, or evaporated skim milk when making cream sauces, soups, or puddings.  Substitute low-fat yogurt, sour cream, or cottage cheese for sour cream and mayonnaise in dips and dressings.  Try lemon juice, herbs, or spices to season food instead of salt, butter, or margarine.  This information is not intended to replace advice given to you by your health care provider. Make sure you discuss any questions you have with your health care provider. Document Released: 03/24/2014 Document Revised: 02/08/2016 Document Reviewed: 02/21/2014 Elsevier Interactive Patient Education  2018 [employer].   Exercising to [employer] Exercising can help you to lose weight. In order to lose weight through exercise, you need to do  vigorous-intensity exercise. You can tell that you are exercising with vigorous intensity if you are breathing very hard and fast and cannot hold a conversation while exercising. Moderate-intensity exercise helps to maintain your current weight. You can tell that you are exercising at a moderate level if you have a higher heart rate and faster breathing, but you are still able to hold a conversation. How often should I exercise? Choose an activity that you enjoy and set realistic goals. Your health care provider can help you to make an activity plan that works for you. Exercise regularly as directed by your health care provider. This may include:  Doing resistance training twice each week, such as: ? Push-ups. ? Sit-ups. ? Lifting weights. ? Using resistance bands.  Doing a given intensity of exercise for a given amount of time. Choose from these options: ? 150 minutes of moderate-intensity exercise every week. ? 75 minutes of vigorous-intensity exercise every week. ? A mix of moderate-intensity and vigorous-intensity exercise  every week.  Children, pregnant women, people who are out of shape, people who are overweight, and older adults may need to consult a health care provider for individual recommendations. If you have any sort of medical condition, be sure to consult your health care provider before starting a new exercise program. What are some activities that can help me to lose weight?  Walking at a rate of at least 4.5 miles an hour.  Jogging or running at a rate of 5 miles per hour.  Biking at a rate of at least 10 miles per hour.  Lap swimming.  Roller-skating or in-line skating.  Cross-country skiing.  Vigorous competitive sports, such as football, basketball, and soccer.  Jumping rope.  Aerobic dancing. How can I be more active in my day-to-day activities?  Use the stairs instead of the elevator.  Take a walk during your lunch break.  If you drive, park your car  farther away from work or school.  If you take public transportation, get off one stop early and walk the rest of the way.  Make all of your phone calls while standing up and walking around.  Get up, stretch, and walk around every 30 minutes throughout the day. What guidelines should I follow while exercising?  Do not exercise so much that you hurt yourself, feel dizzy, or get very short of breath.  Consult your health care provider prior to starting a new exercise program.  Wear comfortable clothes and shoes with good support.  Drink plenty of water while you exercise to prevent dehydration or heat stroke. Body water is lost during exercise and must be replaced.  Work out until you breathe faster and your heart beats faster. This information is not intended to replace advice given to you by your health care provider. Make sure you discuss any questions you have with your health care provider. Document Released: 08/23/2010 Document Revised: 12/27/2015 Document Reviewed: 12/22/2013 Elsevier Interactive Patient Education  [employer].

## 2018-02-23 NOTE — Progress Notes (Signed)
Pre visit review using our clinic review tool, if applicable. No additional management support is needed unless otherwise documented below in the visit note. 

## 2018-03-09 DIAGNOSIS — M5440 Lumbago with sciatica, unspecified side: Secondary | ICD-10-CM | POA: Diagnosis not present

## 2018-03-17 ENCOUNTER — Other Ambulatory Visit: Payer: Self-pay | Admitting: Neurosurgery

## 2018-03-17 DIAGNOSIS — M5440 Lumbago with sciatica, unspecified side: Secondary | ICD-10-CM

## 2018-03-30 ENCOUNTER — Other Ambulatory Visit: Payer: Self-pay | Admitting: Neurosurgery

## 2018-03-30 ENCOUNTER — Ambulatory Visit
Admission: RE | Admit: 2018-03-30 | Discharge: 2018-03-30 | Disposition: A | Discharge status: Home / Self Care | Payer: Medicare Other | Source: Ambulatory Visit | Attending: Neurosurgery | Admitting: Neurosurgery

## 2018-03-30 DIAGNOSIS — M5440 Lumbago with sciatica, unspecified side: Secondary | ICD-10-CM

## 2018-03-30 DIAGNOSIS — M48061 Spinal stenosis, lumbar region without neurogenic claudication: Secondary | ICD-10-CM | POA: Diagnosis not present

## 2018-04-02 ENCOUNTER — Telehealth: Payer: Self-pay

## 2018-04-02 NOTE — Telephone Encounter (Signed)
Copied from [place] [id number]. Topic: Appointment Scheduling - Scheduling Inquiry for Clinic >> Apr 02, 2018 10:57 AM Mylinda Latina, NT wrote: Reason for CRM: Patient called and states that she needed to cancel her appt on Sept 4th to go to her [facility]. She states that Dr. Aundra Dubin wanted to know the outcome of that visit. She wants to see Dr. Aundra Dubin the week after the 4th . She has no open slots until Oct. Please call patient to schedule a soon appt CB# [phone]

## 2018-04-07 ENCOUNTER — Ambulatory Visit: Payer: Self-pay | Admitting: Internal Medicine

## 2018-04-07 DIAGNOSIS — I10 Essential (primary) hypertension: Secondary | ICD-10-CM | POA: Diagnosis not present

## 2018-04-07 DIAGNOSIS — N183 Chronic kidney disease, stage 3 (moderate): Secondary | ICD-10-CM | POA: Diagnosis not present

## 2018-04-07 DIAGNOSIS — Z1159 Encounter for screening for other viral diseases: Secondary | ICD-10-CM | POA: Diagnosis not present

## 2018-04-07 DIAGNOSIS — M7989 Other specified soft tissue disorders: Secondary | ICD-10-CM | POA: Diagnosis not present

## 2018-04-12 DIAGNOSIS — N183 Chronic kidney disease, stage 3 (moderate): Secondary | ICD-10-CM | POA: Diagnosis not present

## 2018-04-12 DIAGNOSIS — Z1159 Encounter for screening for other viral diseases: Secondary | ICD-10-CM | POA: Diagnosis not present

## 2018-04-12 DIAGNOSIS — Z7289 Other problems related to lifestyle: Secondary | ICD-10-CM | POA: Diagnosis not present

## 2018-04-19 ENCOUNTER — Encounter: Payer: Self-pay | Admitting: Internal Medicine

## 2018-04-19 ENCOUNTER — Ambulatory Visit (INDEPENDENT_AMBULATORY_CARE_PROVIDER_SITE_OTHER): Payer: Medicare Other | Admitting: Internal Medicine

## 2018-04-19 VITALS — BP 154/86 | HR 76 | Temp 98.3°F | Ht 62.0 in | Wt 220.2 lb

## 2018-04-19 DIAGNOSIS — D649 Anemia, unspecified: Secondary | ICD-10-CM

## 2018-04-19 DIAGNOSIS — N184 Chronic kidney disease, stage 4 (severe): Secondary | ICD-10-CM | POA: Diagnosis not present

## 2018-04-19 DIAGNOSIS — R5383 Other fatigue: Secondary | ICD-10-CM

## 2018-04-19 DIAGNOSIS — J069 Acute upper respiratory infection, unspecified: Secondary | ICD-10-CM | POA: Insufficient documentation

## 2018-04-19 DIAGNOSIS — I10 Essential (primary) hypertension: Secondary | ICD-10-CM | POA: Diagnosis not present

## 2018-04-19 DIAGNOSIS — R7303 Prediabetes: Secondary | ICD-10-CM | POA: Insufficient documentation

## 2018-04-19 DIAGNOSIS — E039 Hypothyroidism, unspecified: Secondary | ICD-10-CM | POA: Diagnosis not present

## 2018-04-19 LAB — BASIC METABOLIC PANEL
BUN: 27 mg/dL — AB (ref 6–23)
CHLORIDE: 101 meq/L (ref 96–112)
CO2: 30 mEq/L (ref 19–32)
Calcium: 8.6 mg/dL (ref 8.4–10.5)
Creatinine, Ser: 1.69 mg/dL — ABNORMAL HIGH (ref 0.40–1.20)
GFR: 37.93 mL/min — ABNORMAL LOW (ref >60.00)
GLUCOSE: 101 mg/dL — AB (ref 70–99)
POTASSIUM: 3.5 meq/L (ref 3.5–5.1)
Sodium: 136 mEq/L (ref 135–145)

## 2018-04-19 LAB — CBC WITH DIFFERENTIAL/PLATELET
BASOS ABS: 0 10*3/uL (ref 0.0–0.1)
Basophils Relative: 0.3 % (ref 0.0–3.0)
EOS ABS: 0 10*3/uL (ref 0.0–0.7)
Eosinophils Relative: 0.4 % (ref 0.0–5.0)
HEMATOCRIT: 26.8 % — AB (ref 36.0–46.0)
LYMPHS PCT: 10.8 % — AB (ref 12.0–46.0)
Lymphs Abs: 1.3 10*3/uL (ref 0.7–4.0)
MCHC: 32.1 g/dL (ref 30.0–36.0)
MCV: 86.4 fl (ref 78.0–100.0)
Monocytes Absolute: 0.7 10*3/uL (ref 0.1–1.0)
Monocytes Relative: 6.3 % (ref 3.0–12.0)
Neutro Abs: 9.5 10*3/uL — ABNORMAL HIGH (ref 1.4–7.7)
Neutrophils Relative %: 82.2 % — ABNORMAL HIGH (ref 43.0–77.0)
Platelets: 382 10*3/uL (ref 150.0–400.0)
RBC: 3.1 Mil/uL — ABNORMAL LOW (ref 3.87–5.11)
RDW: 13.9 % (ref 11.5–15.5)
WBC: 11.6 10*3/uL — AB (ref 4.0–10.5)

## 2018-04-19 LAB — TSH: TSH: 23.44 u[IU]/mL — AB (ref 0.35–4.50)

## 2018-04-19 MED ORDER — DOXYCYCLINE HYCLATE 100 MG PO TABS: 100.0000 mg | ORAL_TABLET | Freq: Two times a day (BID) | ORAL | 0 refills | Status: DC

## 2018-04-19 NOTE — Progress Notes (Signed)
Pre visit review using our clinic review tool, if applicable. No additional management support is needed unless otherwise documented below in the visit note. 

## 2018-04-19 NOTE — Patient Instructions (Addendum)
Continue Coricidan for cough  Cough drops sugar free  Robitussin DM for cough  Warm tea honey and lemon  If not better call and let me know  [facility] new kidney doctor  [place]  [phone]  You need a kidney biopsy   Cough, Adult Coughing is a reflex that clears your throat and your airways. Coughing helps to heal and protect your lungs. It is normal to cough occasionally, but a cough that happens with other symptoms or lasts a long time may be a sign of a condition that needs treatment. A cough may last only 2-3 weeks (acute), or it may last longer than 8 weeks (chronic). What are the causes? Coughing is commonly caused by:  Breathing in substances that irritate your lungs.  A viral or bacterial respiratory infection.  Allergies.  Asthma.  Postnasal drip.  Smoking.  Acid backing up from the stomach into the esophagus (gastroesophageal reflux).  Certain medicines.  Chronic lung problems, including COPD (or rarely, lung cancer).  Other medical conditions such as heart failure.  Follow these instructions at home: Pay attention to any changes in your symptoms. Take these actions to help with your discomfort:  Take medicines only as told by your health care provider. ? If you were prescribed an antibiotic medicine, take it as told by your health care provider. Do not stop taking the antibiotic even if you start to feel better. ? Talk with your health care provider before you take a cough suppressant medicine.  Drink enough fluid to keep your urine clear or pale yellow.  If the air is dry, use a cold steam vaporizer or humidifier in your bedroom or your home to help loosen secretions.  Avoid anything that causes you to cough at work or at home.  If your cough is worse at night, try sleeping in a semi-upright position.  Avoid cigarette smoke. If you smoke, quit smoking. If you need help quitting, ask your health care  provider.  Avoid caffeine.  Avoid alcohol.  Rest as needed.  Contact a health care provider if:  You have new symptoms.  You cough up pus.  Your cough does not get better after 2-3 weeks, or your cough gets worse.  You cannot control your cough with suppressant medicines and you are losing sleep.  You develop pain that is getting worse or pain that is not controlled with pain medicines.  You have a fever.  You have unexplained weight loss.  You have night sweats. Get help right away if:  You cough up blood.  You have difficulty breathing.  Your heartbeat is very fast. This information is not intended to replace advice given to you by your health care provider. Make sure you discuss any questions you have with your health care provider. Document Released: 01/17/2011 Document Revised: 12/27/2015 Document Reviewed: 09/27/2014 Elsevier Interactive Patient Education  2018 [place].  Chronic Kidney Disease, Adult Chronic kidney disease (CKD) occurs when the kidneys become damaged slowly over a long period of time. The kidneys are a pair of organs that do many important jobs in the body, including:  Removing waste and extra fluid from the blood to make urine.  Making hormones that maintain the amount of fluid in tissues and blood vessels.  Maintaining the right amount of fluids and chemicals in the body.  A small amount of kidney damage may not cause problems, but a large amount of damage may make it hard or impossible  for the kidneys to work the way they should. If steps are not taken to slow down kidney damage or to stop it from getting worse, the kidneys may stop working permanently (end-stage renal disease or ESRD). Most of the time, CKD does not go away, but it can often be controlled. People who have CKD are usually able to live normal lives. What are the causes? The most common causes of this condition are diabetes and high blood pressure (hypertension). Other  causes include:  Heart and blood vessel (cardiovascular) disease.  Kidney diseases, such as: ? Glomerulonephritis. ? Interstitial nephritis. ? Polycystic kidney disease. ? Renal vascular disease.  Diseases that affect the immune system.  Genetic diseases.  Medicines that damage the kidneys, such as anti-inflammatory medicines.  Being around or being in contact with poisonous (toxic) substances.  A kidney or urinary infection that occurs again and again (recurs).  Vasculitis. This is swelling or inflammation of the blood vessels.  A problem with urine flow that may be caused by: ? Cancer. ? Having kidney stones more than one time. ? An enlarged prostate, in males.  What increases the risk? You are more likely to develop this condition if you:  Are older than age 6.  Are female.  Are African-American, Hispanic, Asian, [place], or American [place].  Are a current or former smoker.  Are obese.  Have a family history of kidney disease or failure.  Often take medicines that are damaging to the kidneys.  What are the signs or symptoms? Symptoms of this condition include:  Swelling (edema) of the face, legs, ankles, or feet.  Tiredness (lethargy) and having less energy.  Nausea or vomiting.  Confusion or trouble concentrating.  Problems with urination, such as: ? Painful or burning feeling during urination. ? Decreased urine production. ? Frequent urination, especially at night. ? Bloody urine.  Muscle twitches and cramps, especially in the legs.  Shortness of breath.  Weakness.  Loss of appetite.  Metallic taste in the mouth.  Trouble sleeping.  Dry, itchy skin.  A low blood count (anemia).  Pale lining of the eyelids and surface of the eye (conjunctiva).  Symptoms develop slowly and may not be obvious until the kidney damage becomes severe. It is possible to have kidney disease for years without having any symptoms. How is this  diagnosed? This condition may be diagnosed based on:  Blood tests.  Urine tests.  Imaging tests, such as an ultrasound or CT scan.  A test in which a sample of tissue is removed from the kidneys to be examined under a microscope (kidney biopsy).  These test results will help your health care provider determine how serious the CKD is. How is this treated? There is no cure for most cases of this condition, but treatment usually relieves symptoms and prevents or slows the progression of the disease. Treatment may include:  Making diet changes, which may require you to avoid alcohol, salty foods (sodium), and foods that are high in potassium, calcium, and protein.  Medicines: ? To lower blood pressure. ? To control blood glucose. ? To relieve anemia. ? To relieve swelling. ? To protect your bones. ? To improve the balance of electrolytes in your blood.  Removing toxic waste from the body through types of dialysis, if the kidneys can no longer do their job (kidney failure).  Managing any other conditions that are causing your CKD or making it worse.  Follow these instructions at home: Medicines  Take over-the-counter and  prescription medicines only as told by your health care provider. The dose of some medicines that you take may need to be adjusted.  Do not take any new medicines unless approved by your health care provider. Many medicines can worsen your kidney damage.  Do not take any vitamin and mineral supplements unless approved by your health care provider. Many nutritional supplements can worsen your kidney damage. General instructions  Follow your prescribed diet as told by your health care provider.  Do not use any products that contain nicotine or tobacco, such as cigarettes and e-cigarettes. If you need help quitting, ask your health care provider.  Monitor and track your blood pressure at home. Report changes in your blood pressure as told by your health care  provider.  If you are being treated for diabetes, monitor and track your blood sugar (blood glucose) levels as told by your health care provider.  Maintain a healthy weight. If you need help with this, ask your health care provider.  Start or continue an exercise plan. Exercise at least 30 minutes a day, 5 days a week.  Keep your immunizations up to date as told by your health care provider.  Keep all follow-up visits as told by your health care provider. This is important. Where to find more information:  American Association of Kidney Patients: BombTimer.gl  National Kidney Foundation: www.kidney.[place]: https://mathis.com/  [facility]: www.lifeoptions.org and www.kidneyschool.org Contact a health care provider if:  Your symptoms get worse.  You develop new symptoms. Get help right away if:  You develop symptoms of ESRD, which include: ? Headaches. ? Numbness in the hands or feet. ? Easy bruising. ? Frequent hiccups. ? Chest pain. ? Shortness of breath. ? Lack of menstruation, in women.  You have a fever.  You have decreased urine production.  You have pain or bleeding when you urinate. Summary  Chronic kidney disease (CKD) occurs when the kidneys become damaged slowly over a long period of time.  The most common causes of this condition are diabetes and high blood pressure (hypertension).  There is no cure for most cases of this condition, but treatment usually relieves symptoms and prevents or slows the progression of the disease. Treatment may include a combination of medicines and lifestyle changes. This information is not intended to replace advice given to you by your health care provider. Make sure you discuss any questions you have with your health care provider. Document Released: 04/29/2008 Document Revised: 08/28/2016 Document Reviewed: 08/28/2016 Elsevier Interactive Patient Education  [employer].

## 2018-04-19 NOTE — Progress Notes (Addendum)
Chief Complaint  Patient presents with  . Follow-up   F/u  1. Fatigue and cough x 1-2 weeks last week she reports blowing nose and blood on tissue. Has tried Coricidan for cough otc not helping. Denies fever, no sinus pressure no sick contacts  2. CKD3-4 recently Cr 1.97 with GFR 28 04/12/18 [facility] renal they are rec. Renal bx based on labs at [facility] in [facility] pt wants to go to [place]. See labs in care everywhere -HCV neg 04/12/18, hep B not immune  Creat U 139.0  Protein, Ur 1,990.0  Protein/Creatinine Ratio, Urine 14.317   C3/C4 normal 04/12/18  ANA 10/2017   Antinuclear Antibodies, IFA - [facility] Positive (A)  Comment:  Negative <1:80  Borderline1:80  Positive >1:80  Please note: - [facility] Comment  Comment: ANA Multiplex methodology was designed to detect up to 11 antibodies of the 100+ antibodies that may be detected by ANA IFA methodology.  Specimen Collected on  Speckled Pattern - [facility] 1:1,280 (H)  Nuclear Dot Pattern - [facility] 1:160 (H)   Sjogren's Anti-SS-A - [facility] >8.0 (H) 0 - 0.9 AI  Anti-histone Abs - [facility] 1.1 (H)  Comment:  Negative<1.0  Weak Positive1.0 - 1.5  Moderate Positive1.6 - 2.5  Strong Positive >2.5 0    3. HTN-BP sl elevated today had BP meds clonidine 0.1 bid, lasix 40 qd to bid, losartan 100 mg qd, labetalol 100 mg bid  4. Hypothyroidism last TSH 09/2017 at goal 3.15  5. prediaebtes last A1c 04/12/18 6.0   Review of Systems  Constitutional: Positive for malaise/fatigue. Negative for weight loss.       Wt gain   HENT: Negative for hearing loss.   Eyes: Negative for blurred vision.  Respiratory: Positive for cough. Negative for sputum production and shortness of breath.   Cardiovascular: Negative for chest pain.   Gastrointestinal: Negative for abdominal pain.  Musculoskeletal: Positive for back pain.       +chronic back pain since back surgery x 2    Skin: Negative for rash.  Neurological: Negative for headaches.  Psychiatric/Behavioral: Negative for depression.   Past Medical History:  Diagnosis Date  . Acute blood loss anemia 03/14/2017  . Acute renal failure superimposed on stage 3 chronic kidney disease ([place]) 03/14/2017  . AKI (acute kidney injury) ([place]) 02/28/2017  . Altered mental status 03/29/2017  . Anemia   . Aortic atherosclerosis ([place])   . Arthritis   . Bilateral lower extremity edema 03/04/2017  . Bradycardia   . Cataract   . Chronic kidney disease    ?? renal insufficiency,   . CKD (chronic kidney disease) stage 3, GFR 30-59 ml/min ([facility]) 01/15/2017   ?? renal insufficiency, which she thinks is coming from "all these medications"  . Diabetes mellitus without complication ([place])    diagnosed 4-5 yrs ago  . Disease of pancreas   . Diverticulitis    s/p perforation and partial colectomy 01/27/14 with 3 benign lymph nodes   . Diverticulosis 01/15/2017  . DJD (degenerative joint disease)   . DM (diabetes mellitus), type 2 with renal complications ([place]) 1/66/0630  . Fatty liver   . Hypertension   . Hypothyroidism   . Hypothyroidism    ? etiology autoimmune or acquired   . Kidney stone   . Lethargy 02/28/2017  . Obesity, Class III, BMI 40-49.9 (morbid obesity) ([place]) 02/27/2017  . Pleural lipoma   . Postoperative wound infection 04/02/2017  . Spinal stenosis of lumbar region 01/15/2017  . Status post  lumbar surgery 02/24/2017  . Ventral hernia   . Wound healing, delayed    back   Past Surgical History:  Procedure Laterality Date  . ABDOMINAL EXPOSURE N/A 02/24/2017   Procedure: ABDOMINAL EXPOSURE;  Surgeon: Angelia Mould, MD;  Location: [place];  Service: Vascular;  Laterality: N/A;  . ABDOMINAL HYSTERECTOMY  1987   no h/o abnormal paps   . ANTERIOR LAT LUMBAR FUSION N/A  02/24/2017   Procedure: Lumbar three- five Anterior lateral lumbar interbody fusion;  Surgeon: Ditty, Kevan Ny, MD;  Location: [place];  Service: Neurosurgery;  Laterality: N/A;  L3-5 Anterior lateral lumbar interbody fusion with removal of coflex at L3-4, L4-5  . ANTERIOR LUMBAR FUSION N/A 02/24/2017   Procedure: Stage 1: Lumbar five-Sacral one Anterior lumbar interbody fusion;  Surgeon: Ditty, Kevan Ny, MD;  Location: [place];  Service: Neurosurgery;  Laterality: N/A;  Stage 1: L5-S1 Anterior lumbar interbody fusion  . APPENDECTOMY    . APPLICATION OF ROBOTIC ASSISTANCE FOR SPINAL PROCEDURE N/A 02/26/2017   Procedure: APPLICATION OF ROBOTIC ASSISTANCE FOR SPINAL PROCEDURE;  Surgeon: Ditty, Kevan Ny, MD;  Location: [place];  Service: Neurosurgery;  Laterality: N/A;  . APPLICATION OF WOUND VAC N/A 03/30/2017   Procedure: APPLICATION OF WOUND VAC;  Surgeon: Ditty, Kevan Ny, MD;  Location: [facility];  Service: Neurosurgery;  Laterality: N/A;  . BACK SURGERY  2013  . BREAST SURGERY  1992   Breast Reduction  . CHOLECYSTECTOMY  1982  . COLON SURGERY  01/26/2014   desc.sigmoid colectomy and ventral hernia repair and splenic flexure mobilization  . COLON SURGERY    . EYE SURGERY Right 2017   cataracts  . [place]  . LUMBAR FUSION  02/24/2017  . LUMBAR LAMINECTOMY WITH SPINOUS PROCESS PLATE 2 LEVEL N/A 2/69/4854   Procedure: LUMBAR LAMINECTOMY/DECOMPRESSION MICRODISCECTOMY CoFlex;  Surgeon: Faythe Ghee, MD;  Location: [facility];  Service: Neurosurgery;  Laterality: N/A;  Lumbar three-four,Lumbar Four-Five Laminectomy with Coflex  . LUMBAR WOUND DEBRIDEMENT N/A 03/30/2017   Procedure: Lumbar wound exploration/debridement, placement of wound vac;  Surgeon: Ditty, Kevan Ny, MD;  Location: [place];  Service: Neurosurgery;  Laterality: N/A;  Lumbar wound exploration/debridement, placement of wound vac  . PARTIAL COLECTOMY     01/27/14 diverticulitis and 3 benign lymph  nodes [facility] Dr. Pat Patrick   . VENTRAL HERNIA REPAIR     Family History  Problem Relation Age of Onset  . Diabetes Father   . Heart attack Father   . Hypertension Father   . Heart disease Father   . Kidney disease Father   . Diabetes Mother   . Hypertension Mother   . Heart disease Mother   . Kidney disease Mother   . Lupus Sister   . Heart disease Brother   . Diabetes Brother   . Heart disease Brother   . Diabetes Brother   . Lupus Sister    Social History   Socioeconomic History  . Marital status: Widowed    Spouse name: Not on file  . Number of children: Not on file  . Years of education: Not on file  . Highest education level: Not on file  Occupational History  . Occupation: retired Economist work  Scientific laboratory technician  . Financial resource strain: Not hard at all  . Food insecurity:    Worry: Never true    Inability: Never true  . Transportation needs:    Medical: No    Non-medical: No  Tobacco Use  .  Smoking status: Never Smoker  . Smokeless tobacco: Never Used  Substance and Sexual Activity  . Alcohol use: No  . Drug use: No  . Sexual activity: Never  Lifestyle  . Physical activity:    Days per week: 0 days    Minutes per session: Not on file  . Stress: Not on file  Relationships  . Social connections:    Talks on phone: Not on file    Gets together: Not on file    Attends religious service: Not on file    Active member of club or organization: Not on file    Attends meetings of clubs or organizations: Not on file    Relationship status: Not on file  . Intimate partner violence:    Fear of current or ex partner: Not on file    Emotionally abused: Not on file    Physically abused: Not on file    Forced sexual activity: Not on file  Other Topics Concern  . 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 acetaminophen (TYLENOL) 500 MG tablet Take 2 tablets (1,000 mg total) by mouth 3 (three) times daily.  Marland Kitchen atorvastatin (LIPITOR) 40 MG tablet Take 1 tablet (40 mg total) by mouth every evening.  . blood glucose meter kit and supplies KIT Dispense based on patient and insurance preference. Use up to four times daily as directed. (FOR ICD-9 250.00, 250.01).  . Cholecalciferol 1000 units capsule Take 1 capsule (1,000 Units total) by mouth daily.  . cloNIDine (CATAPRES) 0.1 MG tablet Take 1 tablet (0.1 mg total) by mouth 2 (two) times daily.  . diclofenac sodium (VOLTAREN) 1 % GEL Apply 4 g topically 4 (four) times daily. Knees b/l  . ferrous sulfate 325 (65 FE) MG tablet Take 1 tablet (325 mg total) by mouth daily with lunch.  . furosemide (LASIX) 40 MG tablet Take 1 tablet (40 mg total) by mouth daily. In am  . gabapentin (NEURONTIN) 300 MG capsule Take 1 capsule (300 mg total) by mouth at bedtime.  Marland Kitchen labetalol (NORMODYNE) 100 MG tablet Take 100 mg by mouth 2 (two) times daily.   Marland Kitchen levothyroxine (SYNTHROID, LEVOTHROID) 100 MCG tablet Take 1 tablet (100 mcg total) by mouth daily before breakfast.  . losartan (COZAAR) 100 MG tablet Take 1 tablet (100 mg total) by mouth daily after breakfast.  . Magnesium Oxide 400 MG CAPS Take 1 capsule (400 mg total) by mouth daily.  . vitamin C (ASCORBIC ACID) 500 MG tablet Take 500 mg by mouth. Take one tablet twice daily to aid wound healing   Allergies  Allergen Reactions  . Clams [Shellfish Allergy] Swelling and Other (See Comments)    THROAT SWELLS NECK TURNS RED  . Hydralazine Other (See Comments)    CHEST TIGHTNESS  . Norvasc [Amlodipine Besylate]     Leg edema   . Milk-Related Compounds Diarrhea   Recent Results (from the past 2160 hour(s))  POCT glycosylated hemoglobin (Hb A1C)     Status: Normal   Collection Time: 02/23/18  2:54 PM  Result Value Ref Range   Hemoglobin A1C  4.0 - 5.6  %   HbA1c POC (<>  result, manual entry)  4.0 - 5.6 %   HbA1c, POC (prediabetic range) 5.7 5.7 - 6.4 %   HbA1c, POC (controlled diabetic range)  0.0 - 7.0 %   Objective  Body mass index is 40.28 kg/m. Wt Readings from Last 3 Encounters:  04/19/18 220 lb 3.2 oz (99.9 kg)  02/23/18 218 lb 12.8 oz (99.2 kg)  01/05/18 208 lb 3.2 oz (94.4 kg)   Temp Readings from Last 3 Encounters:  04/19/18 98.3 F (36.8 C) (Oral)  02/23/18 97.9 F (36.6 C) (Oral)  01/05/18 98 F (36.7 C) (Oral)   BP Readings from Last 3 Encounters:  04/19/18 (!) 154/86  02/23/18 136/84  01/05/18 (!) 182/82   Pulse Readings from Last 3 Encounters:  04/19/18 76  02/23/18 (!) 58  01/05/18 77    Physical Exam  Constitutional: She is oriented to person, place, and time. She appears well-developed and well-nourished. She is cooperative.  HENT:  Head: Normocephalic and atraumatic.  Mouth/Throat: Oropharynx is clear and moist and mucous membranes are normal.  Eyes: Pupils are equal, round, and reactive to light. Conjunctivae are normal.  Cardiovascular: Normal rate, regular rhythm and normal heart sounds.  1 to 2 + leg edema b/l   Pulmonary/Chest: Effort normal and breath sounds normal.  Neurological: She is alert and oriented to person, place, and time. Gait normal.  Gait slowed today    Skin: Skin is warm, dry and intact.  Psychiatric: She has a normal mood and affect. Her speech is normal and behavior is normal. Judgment and thought content normal. Cognition and memory are normal.  Nursing note and vitals reviewed.   Assessment   1. Fatigue  2. Dry cough x 1-2 weeks could be 2/2 URI consider other if does not improved consider CXR  3. Worsening CKD Cr BL 1.2-1.5 with recent 1.97 ANA + titer 1:1280 in the past FH lupus  4. Uncontrolled HTN  5. Hypothyroidism  6. Prediabetes  7. HM Plan   1. Labs today bmet, cbc, tsh  2. Trial of doxy bid x 1 week if not better consider chest Xray  Robitussin dm vs  coricidan otc  3. Refer to [facility] see HPI  Needs renal biopsy per [facility] renal notes  4. Cont meds  Need help from renal to control 5. Cont levothyroxine  5. A1C was 6.0 04/12/18  7.  Hold on flu shot for now [place] 08/08/08, pna [id number],08/08/08,  prevnar,zostavax had 08/15/08 -shingrixand pna 23 consider in future  mammo1/17/19 neg Colonoscopy had 2015 Dr. Pat Patrick [facility] need to get recordsor review [place] recordsDr. Bary Castilla 2013 -see report 01/27/14 diverticulitis consider repeat colonoscopy 2020 vs cologaurd in future  Out of age window papsp hysterectomy 1987 DEXAper Dr. Ronnald Collum 05/14/15 and 01/2017 unable to read requested mailed copy h/o osteopenia/osteoporosis  -saw copy 05/14/15 mild osteopenia left femoral neck -consider repeat DEXA in future 3-5 years  Hep C and HIV neg 04/12/18   Hosp 06/05/18 to 06/12/18 for sob, uncontrolled HTN, c/o wound infection/spin abscess prior back surgery f/u NS needs BMET, CBC in 1 week, elevated Creatinine   Saw [facility] in [place] 05/10/18 CKD 4 with proteinuria and HTN they are considering renal bx M spike neg ANCA neg, PR3 neg, C ANCA neg and p aNCA neg, . rec med compliance rec clonidine patch 0.1 mg weekly checking iron Dr. Johnney Ou   Saw NS Narda Amber NS 08/16/2018 2nd post op s/p wound exploration and wound vac due to lumbar wound  infection and wound dehiscence 2/2 chronic proteus over the last 1.5 years with h/o wound 3x per week MWF BP was 200/86 at the visit pulse 81  Provider: Dr. Olivia Mackie McLean-Scocuzza-Internal Medicine

## 2018-04-28 ENCOUNTER — Other Ambulatory Visit: Payer: Self-pay | Admitting: Internal Medicine

## 2018-04-28 DIAGNOSIS — D649 Anemia, unspecified: Secondary | ICD-10-CM

## 2018-04-28 DIAGNOSIS — R58 Hemorrhage, not elsewhere classified: Secondary | ICD-10-CM

## 2018-04-28 DIAGNOSIS — E039 Hypothyroidism, unspecified: Secondary | ICD-10-CM

## 2018-04-28 MED ORDER — LEVOTHYROXINE SODIUM 112 MCG PO TABS: 112.0000 ug | ORAL_TABLET | Freq: Every day | ORAL | 3 refills | Status: DC

## 2018-05-01 DIAGNOSIS — R509 Fever, unspecified: Secondary | ICD-10-CM | POA: Diagnosis not present

## 2018-05-01 DIAGNOSIS — M791 Myalgia, unspecified site: Secondary | ICD-10-CM | POA: Diagnosis not present

## 2018-05-01 DIAGNOSIS — R5383 Other fatigue: Secondary | ICD-10-CM | POA: Diagnosis not present

## 2018-05-01 DIAGNOSIS — Z1159 Encounter for screening for other viral diseases: Secondary | ICD-10-CM | POA: Diagnosis not present

## 2018-05-04 ENCOUNTER — Telehealth: Payer: Self-pay | Admitting: Internal Medicine

## 2018-05-04 NOTE — Telephone Encounter (Signed)
BP 174/118 05/01/18  dx'ed flu and tx'ed   [place]

## 2018-05-10 DIAGNOSIS — N184 Chronic kidney disease, stage 4 (severe): Secondary | ICD-10-CM | POA: Diagnosis not present

## 2018-05-10 DIAGNOSIS — R809 Proteinuria, unspecified: Secondary | ICD-10-CM | POA: Diagnosis not present

## 2018-05-10 DIAGNOSIS — I129 Hypertensive chronic kidney disease with stage 1 through stage 4 chronic kidney disease, or unspecified chronic kidney disease: Secondary | ICD-10-CM | POA: Diagnosis not present

## 2018-05-10 DIAGNOSIS — D649 Anemia, unspecified: Secondary | ICD-10-CM | POA: Diagnosis not present

## 2018-06-03 ENCOUNTER — Other Ambulatory Visit: Payer: Self-pay

## 2018-06-03 ENCOUNTER — Encounter: Payer: Self-pay | Admitting: Emergency Medicine

## 2018-06-03 DIAGNOSIS — Z833 Family history of diabetes mellitus: Secondary | ICD-10-CM

## 2018-06-03 DIAGNOSIS — Z888 Allergy status to other drugs, medicaments and biological substances status: Secondary | ICD-10-CM | POA: Diagnosis not present

## 2018-06-03 DIAGNOSIS — G0491 Myelitis, unspecified: Secondary | ICD-10-CM | POA: Diagnosis not present

## 2018-06-03 DIAGNOSIS — R760 Raised antibody titer: Secondary | ICD-10-CM | POA: Diagnosis not present

## 2018-06-03 DIAGNOSIS — L02212 Cutaneous abscess of back [any part, except buttock]: Secondary | ICD-10-CM | POA: Diagnosis present

## 2018-06-03 DIAGNOSIS — Z9049 Acquired absence of other specified parts of digestive tract: Secondary | ICD-10-CM

## 2018-06-03 DIAGNOSIS — T39395A Adverse effect of other nonsteroidal anti-inflammatory drugs [NSAID], initial encounter: Secondary | ICD-10-CM | POA: Diagnosis not present

## 2018-06-03 DIAGNOSIS — M5126 Other intervertebral disc displacement, lumbar region: Secondary | ICD-10-CM | POA: Diagnosis not present

## 2018-06-03 DIAGNOSIS — Z6841 Body Mass Index (BMI) 40.0 and over, adult: Secondary | ICD-10-CM | POA: Diagnosis not present

## 2018-06-03 DIAGNOSIS — D72829 Elevated white blood cell count, unspecified: Secondary | ICD-10-CM | POA: Diagnosis not present

## 2018-06-03 DIAGNOSIS — S31001A Unspecified open wound of lower back and pelvis with penetration into retroperitoneum, initial encounter: Secondary | ICD-10-CM | POA: Diagnosis not present

## 2018-06-03 DIAGNOSIS — B964 Proteus (mirabilis) (morganii) as the cause of diseases classified elsewhere: Secondary | ICD-10-CM | POA: Diagnosis not present

## 2018-06-03 DIAGNOSIS — I700 Atherosclerosis of aorta: Secondary | ICD-10-CM | POA: Diagnosis present

## 2018-06-03 DIAGNOSIS — N179 Acute kidney failure, unspecified: Secondary | ICD-10-CM | POA: Diagnosis present

## 2018-06-03 DIAGNOSIS — E1122 Type 2 diabetes mellitus with diabetic chronic kidney disease: Secondary | ICD-10-CM | POA: Diagnosis present

## 2018-06-03 DIAGNOSIS — Z981 Arthrodesis status: Secondary | ICD-10-CM | POA: Diagnosis not present

## 2018-06-03 DIAGNOSIS — D509 Iron deficiency anemia, unspecified: Secondary | ICD-10-CM | POA: Diagnosis present

## 2018-06-03 DIAGNOSIS — Z8249 Family history of ischemic heart disease and other diseases of the circulatory system: Secondary | ICD-10-CM

## 2018-06-03 DIAGNOSIS — J9811 Atelectasis: Secondary | ICD-10-CM | POA: Diagnosis not present

## 2018-06-03 DIAGNOSIS — Z841 Family history of disorders of kidney and ureter: Secondary | ICD-10-CM

## 2018-06-03 DIAGNOSIS — Z8269 Family history of other diseases of the musculoskeletal system and connective tissue: Secondary | ICD-10-CM

## 2018-06-03 DIAGNOSIS — Z8619 Personal history of other infectious and parasitic diseases: Secondary | ICD-10-CM | POA: Diagnosis not present

## 2018-06-03 DIAGNOSIS — D631 Anemia in chronic kidney disease: Secondary | ICD-10-CM | POA: Diagnosis not present

## 2018-06-03 DIAGNOSIS — G061 Intraspinal abscess and granuloma: Secondary | ICD-10-CM | POA: Diagnosis not present

## 2018-06-03 DIAGNOSIS — K760 Fatty (change of) liver, not elsewhere classified: Secondary | ICD-10-CM | POA: Diagnosis present

## 2018-06-03 DIAGNOSIS — Z91013 Allergy to seafood: Secondary | ICD-10-CM

## 2018-06-03 DIAGNOSIS — A419 Sepsis, unspecified organism: Secondary | ICD-10-CM | POA: Diagnosis not present

## 2018-06-03 DIAGNOSIS — M48061 Spinal stenosis, lumbar region without neurogenic claudication: Secondary | ICD-10-CM | POA: Diagnosis not present

## 2018-06-03 DIAGNOSIS — R600 Localized edema: Secondary | ICD-10-CM | POA: Diagnosis not present

## 2018-06-03 DIAGNOSIS — E785 Hyperlipidemia, unspecified: Secondary | ICD-10-CM | POA: Diagnosis present

## 2018-06-03 DIAGNOSIS — T8149XA Infection following a procedure, other surgical site, initial encounter: Secondary | ICD-10-CM | POA: Diagnosis not present

## 2018-06-03 DIAGNOSIS — I2510 Atherosclerotic heart disease of native coronary artery without angina pectoris: Secondary | ICD-10-CM | POA: Diagnosis not present

## 2018-06-03 DIAGNOSIS — I130 Hypertensive heart and chronic kidney disease with heart failure and stage 1 through stage 4 chronic kidney disease, or unspecified chronic kidney disease: Secondary | ICD-10-CM | POA: Diagnosis not present

## 2018-06-03 DIAGNOSIS — I509 Heart failure, unspecified: Secondary | ICD-10-CM | POA: Diagnosis present

## 2018-06-03 DIAGNOSIS — J810 Acute pulmonary edema: Secondary | ICD-10-CM | POA: Diagnosis not present

## 2018-06-03 DIAGNOSIS — N183 Chronic kidney disease, stage 3 (moderate): Secondary | ICD-10-CM | POA: Diagnosis present

## 2018-06-03 DIAGNOSIS — T8149XD Infection following a procedure, other surgical site, subsequent encounter: Secondary | ICD-10-CM | POA: Diagnosis not present

## 2018-06-03 DIAGNOSIS — Z79899 Other long term (current) drug therapy: Secondary | ICD-10-CM

## 2018-06-03 DIAGNOSIS — Z8719 Personal history of other diseases of the digestive system: Secondary | ICD-10-CM

## 2018-06-03 DIAGNOSIS — E039 Hypothyroidism, unspecified: Secondary | ICD-10-CM | POA: Diagnosis not present

## 2018-06-03 DIAGNOSIS — Z87442 Personal history of urinary calculi: Secondary | ICD-10-CM

## 2018-06-03 DIAGNOSIS — I161 Hypertensive emergency: Secondary | ICD-10-CM | POA: Diagnosis not present

## 2018-06-03 DIAGNOSIS — R601 Generalized edema: Secondary | ICD-10-CM | POA: Diagnosis not present

## 2018-06-03 DIAGNOSIS — I5030 Unspecified diastolic (congestive) heart failure: Secondary | ICD-10-CM | POA: Diagnosis not present

## 2018-06-03 DIAGNOSIS — I129 Hypertensive chronic kidney disease with stage 1 through stage 4 chronic kidney disease, or unspecified chronic kidney disease: Secondary | ICD-10-CM | POA: Diagnosis not present

## 2018-06-03 DIAGNOSIS — Z79890 Hormone replacement therapy (postmenopausal): Secondary | ICD-10-CM

## 2018-06-03 DIAGNOSIS — R0602 Shortness of breath: Secondary | ICD-10-CM | POA: Diagnosis not present

## 2018-06-03 DIAGNOSIS — M609 Myositis, unspecified: Secondary | ICD-10-CM | POA: Diagnosis not present

## 2018-06-03 DIAGNOSIS — Z791 Long term (current) use of non-steroidal anti-inflammatories (NSAID): Secondary | ICD-10-CM

## 2018-06-03 DIAGNOSIS — I10 Essential (primary) hypertension: Secondary | ICD-10-CM | POA: Diagnosis not present

## 2018-06-03 DIAGNOSIS — Z91011 Allergy to milk products: Secondary | ICD-10-CM | POA: Diagnosis not present

## 2018-06-03 DIAGNOSIS — M545 Low back pain: Secondary | ICD-10-CM | POA: Diagnosis not present

## 2018-06-03 DIAGNOSIS — T8463XD Infection and inflammatory reaction due to internal fixation device of spine, subsequent encounter: Secondary | ICD-10-CM | POA: Diagnosis not present

## 2018-06-03 DIAGNOSIS — Z90710 Acquired absence of both cervix and uterus: Secondary | ICD-10-CM

## 2018-06-03 DIAGNOSIS — E8809 Other disorders of plasma-protein metabolism, not elsewhere classified: Secondary | ICD-10-CM | POA: Diagnosis not present

## 2018-06-03 DIAGNOSIS — I160 Hypertensive urgency: Secondary | ICD-10-CM | POA: Diagnosis not present

## 2018-06-03 DIAGNOSIS — L89159 Pressure ulcer of sacral region, unspecified stage: Secondary | ICD-10-CM | POA: Diagnosis not present

## 2018-06-03 NOTE — ED Triage Notes (Signed)
Pt to triage via w/c with no distress noted; reports hx back surgery; have been having lower back pain, nonradiating with no accomp symptoms; st her scar is "leaking"--lower midline back scar with edges well approximated--no redness/swelling or drainage noted

## 2018-06-04 ENCOUNTER — Inpatient Hospital Stay (HOSPITAL_COMMUNITY)
Admit: 2018-06-04 | Discharge: 2018-06-04 | Disposition: A | Discharge status: Home / Self Care | Payer: Medicare Other | Attending: Pulmonary Disease | Admitting: Pulmonary Disease

## 2018-06-04 ENCOUNTER — Inpatient Hospital Stay: Payer: Medicare Other

## 2018-06-04 ENCOUNTER — Emergency Department: Payer: Medicare Other

## 2018-06-04 ENCOUNTER — Inpatient Hospital Stay
Admission: EM | Admit: 2018-06-04 | Discharge: 2018-06-05 | DRG: 872 | Disposition: A | Discharge status: Other Acute Inpatient Hospital | Payer: Medicare Other | Attending: Internal Medicine | Admitting: Internal Medicine

## 2018-06-04 ENCOUNTER — Other Ambulatory Visit: Payer: Self-pay

## 2018-06-04 DIAGNOSIS — N183 Chronic kidney disease, stage 3 (moderate): Secondary | ICD-10-CM | POA: Diagnosis present

## 2018-06-04 DIAGNOSIS — B964 Proteus (mirabilis) (morganii) as the cause of diseases classified elsewhere: Secondary | ICD-10-CM | POA: Diagnosis not present

## 2018-06-04 DIAGNOSIS — L89159 Pressure ulcer of sacral region, unspecified stage: Secondary | ICD-10-CM

## 2018-06-04 DIAGNOSIS — Z8619 Personal history of other infectious and parasitic diseases: Secondary | ICD-10-CM | POA: Diagnosis not present

## 2018-06-04 DIAGNOSIS — J810 Acute pulmonary edema: Secondary | ICD-10-CM

## 2018-06-04 DIAGNOSIS — Z91011 Allergy to milk products: Secondary | ICD-10-CM

## 2018-06-04 DIAGNOSIS — Z91013 Allergy to seafood: Secondary | ICD-10-CM | POA: Diagnosis not present

## 2018-06-04 DIAGNOSIS — E039 Hypothyroidism, unspecified: Secondary | ICD-10-CM | POA: Diagnosis present

## 2018-06-04 DIAGNOSIS — I130 Hypertensive heart and chronic kidney disease with heart failure and stage 1 through stage 4 chronic kidney disease, or unspecified chronic kidney disease: Secondary | ICD-10-CM | POA: Diagnosis present

## 2018-06-04 DIAGNOSIS — A419 Sepsis, unspecified organism: Secondary | ICD-10-CM | POA: Diagnosis present

## 2018-06-04 DIAGNOSIS — I2510 Atherosclerotic heart disease of native coronary artery without angina pectoris: Secondary | ICD-10-CM | POA: Diagnosis present

## 2018-06-04 DIAGNOSIS — D72829 Elevated white blood cell count, unspecified: Secondary | ICD-10-CM | POA: Diagnosis not present

## 2018-06-04 DIAGNOSIS — G061 Intraspinal abscess and granuloma: Secondary | ICD-10-CM | POA: Diagnosis not present

## 2018-06-04 DIAGNOSIS — N179 Acute kidney failure, unspecified: Secondary | ICD-10-CM

## 2018-06-04 DIAGNOSIS — T8149XA Infection following a procedure, other surgical site, initial encounter: Secondary | ICD-10-CM

## 2018-06-04 DIAGNOSIS — Z9049 Acquired absence of other specified parts of digestive tract: Secondary | ICD-10-CM | POA: Diagnosis not present

## 2018-06-04 DIAGNOSIS — I161 Hypertensive emergency: Secondary | ICD-10-CM | POA: Diagnosis present

## 2018-06-04 DIAGNOSIS — I5030 Unspecified diastolic (congestive) heart failure: Secondary | ICD-10-CM

## 2018-06-04 DIAGNOSIS — L02212 Cutaneous abscess of back [any part, except buttock]: Secondary | ICD-10-CM | POA: Diagnosis present

## 2018-06-04 DIAGNOSIS — T8463XD Infection and inflammatory reaction due to internal fixation device of spine, subsequent encounter: Secondary | ICD-10-CM | POA: Diagnosis not present

## 2018-06-04 DIAGNOSIS — D509 Iron deficiency anemia, unspecified: Secondary | ICD-10-CM | POA: Diagnosis present

## 2018-06-04 DIAGNOSIS — J9811 Atelectasis: Secondary | ICD-10-CM

## 2018-06-04 DIAGNOSIS — Z981 Arthrodesis status: Secondary | ICD-10-CM | POA: Diagnosis not present

## 2018-06-04 DIAGNOSIS — I509 Heart failure, unspecified: Secondary | ICD-10-CM | POA: Diagnosis present

## 2018-06-04 DIAGNOSIS — Z888 Allergy status to other drugs, medicaments and biological substances status: Secondary | ICD-10-CM | POA: Diagnosis not present

## 2018-06-04 DIAGNOSIS — T8149XD Infection following a procedure, other surgical site, subsequent encounter: Secondary | ICD-10-CM | POA: Diagnosis not present

## 2018-06-04 DIAGNOSIS — R760 Raised antibody titer: Secondary | ICD-10-CM | POA: Diagnosis not present

## 2018-06-04 DIAGNOSIS — G0491 Myelitis, unspecified: Secondary | ICD-10-CM

## 2018-06-04 DIAGNOSIS — R601 Generalized edema: Secondary | ICD-10-CM

## 2018-06-04 DIAGNOSIS — I700 Atherosclerosis of aorta: Secondary | ICD-10-CM | POA: Diagnosis present

## 2018-06-04 DIAGNOSIS — M48061 Spinal stenosis, lumbar region without neurogenic claudication: Secondary | ICD-10-CM | POA: Diagnosis present

## 2018-06-04 DIAGNOSIS — I129 Hypertensive chronic kidney disease with stage 1 through stage 4 chronic kidney disease, or unspecified chronic kidney disease: Secondary | ICD-10-CM

## 2018-06-04 DIAGNOSIS — M609 Myositis, unspecified: Secondary | ICD-10-CM | POA: Diagnosis present

## 2018-06-04 DIAGNOSIS — R404 Transient alteration of awareness: Secondary | ICD-10-CM

## 2018-06-04 DIAGNOSIS — E785 Hyperlipidemia, unspecified: Secondary | ICD-10-CM | POA: Diagnosis present

## 2018-06-04 DIAGNOSIS — K760 Fatty (change of) liver, not elsewhere classified: Secondary | ICD-10-CM | POA: Diagnosis present

## 2018-06-04 DIAGNOSIS — T39395A Adverse effect of other nonsteroidal anti-inflammatory drugs [NSAID], initial encounter: Secondary | ICD-10-CM | POA: Diagnosis present

## 2018-06-04 DIAGNOSIS — Z79899 Other long term (current) drug therapy: Secondary | ICD-10-CM | POA: Diagnosis not present

## 2018-06-04 DIAGNOSIS — E8809 Other disorders of plasma-protein metabolism, not elsewhere classified: Secondary | ICD-10-CM

## 2018-06-04 DIAGNOSIS — Z6841 Body Mass Index (BMI) 40.0 and over, adult: Secondary | ICD-10-CM | POA: Diagnosis not present

## 2018-06-04 DIAGNOSIS — E1122 Type 2 diabetes mellitus with diabetic chronic kidney disease: Secondary | ICD-10-CM | POA: Diagnosis present

## 2018-06-04 DIAGNOSIS — Z90710 Acquired absence of both cervix and uterus: Secondary | ICD-10-CM | POA: Diagnosis not present

## 2018-06-04 DIAGNOSIS — D631 Anemia in chronic kidney disease: Secondary | ICD-10-CM | POA: Diagnosis present

## 2018-06-04 LAB — TROPONIN I
Troponin I: 0.03 ng/mL (ref <0.03)
Troponin I: 0.03 ng/mL (ref <0.03)
Troponin I: 0.07 ng/mL (ref <0.03)
Troponin I: 0.03 ng/mL (ref <0.03)

## 2018-06-04 LAB — CBC WITH DIFFERENTIAL/PLATELET
BASOS ABS: 0 10*3/uL (ref 0.0–0.1)
BASOS PCT: 0 %
Band Neutrophils: 7 %
EOS ABS: 0.1 10*3/uL (ref 0.0–0.5)
EOS PCT: 1 %
HEMATOCRIT: 24.8 % — AB (ref 36.0–46.0)
HEMOGLOBIN: 7.7 g/dL — AB (ref 12.0–15.0)
Lymphocytes Relative: 8 %
Lymphs Abs: 1.2 10*3/uL (ref 0.7–4.0)
MCH: 26.3 pg (ref 26.0–34.0)
MCHC: 31 g/dL (ref 30.0–36.0)
MCV: 84.6 fL (ref 80.0–100.0)
Monocytes Absolute: 0.6 10*3/uL (ref 0.1–1.0)
Monocytes Relative: 4 %
NEUTROS PCT: 80 %
NRBC: 0 % (ref 0.0–0.2)
Neutro Abs: 13.4 10*3/uL — ABNORMAL HIGH (ref 1.7–7.7)
Platelets: 456 10*3/uL — ABNORMAL HIGH (ref 150–400)
RBC: 2.93 MIL/uL — ABNORMAL LOW (ref 3.87–5.11)
RDW: 15.9 % — AB (ref 11.5–15.5)
Smear Review: ADEQUATE
WBC: 17.7 10*3/uL — ABNORMAL HIGH (ref 4.0–10.5)

## 2018-06-04 LAB — GLUCOSE, CAPILLARY
Glucose-Capillary: 105 mg/dL — ABNORMAL HIGH (ref 70–99)
Glucose-Capillary: 153 mg/dL — ABNORMAL HIGH (ref 70–99)

## 2018-06-04 LAB — CBC
HCT: 21.3 % — ABNORMAL LOW (ref 36.0–46.0)
Hemoglobin: 6.8 g/dL — ABNORMAL LOW (ref 12.0–15.0)
MCH: 26 pg (ref 26.0–34.0)
MCHC: 31.9 g/dL (ref 30.0–36.0)
MCV: 81.3 fL (ref 80.0–100.0)
NRBC: 0.1 % (ref 0.0–0.2)
Platelets: 355 10*3/uL (ref 150–400)
RBC: 2.62 MIL/uL — ABNORMAL LOW (ref 3.87–5.11)
RDW: 15.9 % — AB (ref 11.5–15.5)
WBC: 18.8 10*3/uL — ABNORMAL HIGH (ref 4.0–10.5)

## 2018-06-04 LAB — BASIC METABOLIC PANEL
ANION GAP: 9 (ref 5–15)
BUN: 51 mg/dL — ABNORMAL HIGH (ref 8–23)
CALCIUM: 8.3 mg/dL — AB (ref 8.9–10.3)
CO2: 24 mmol/L (ref 22–32)
Chloride: 104 mmol/L (ref 98–111)
Creatinine, Ser: 2.36 mg/dL — ABNORMAL HIGH (ref 0.44–1.00)
GFR calc Af Amer: 22 mL/min — ABNORMAL LOW (ref >60)
GFR calc non Af Amer: 19 mL/min — ABNORMAL LOW (ref >60)
Glucose, Bld: 199 mg/dL — ABNORMAL HIGH (ref 70–99)
Potassium: 4.3 mmol/L (ref 3.5–5.1)
Sodium: 137 mmol/L (ref 135–145)

## 2018-06-04 LAB — IRON AND TIBC
IRON: 18 ug/dL — AB (ref 28–170)
Saturation Ratios: 10 % — ABNORMAL LOW (ref 10.4–31.8)
TIBC: 177 ug/dL — AB (ref 250–450)
UIBC: 159 ug/dL

## 2018-06-04 LAB — PROTIME-INR
INR: 1.38
Prothrombin Time: 16.8 seconds — ABNORMAL HIGH (ref 11.4–15.2)

## 2018-06-04 LAB — COMPREHENSIVE METABOLIC PANEL
ALT: 12 U/L (ref 0–44)
AST: 17 U/L (ref 15–41)
Albumin: 2.3 g/dL — ABNORMAL LOW (ref 3.5–5.0)
Alkaline Phosphatase: 75 U/L (ref 38–126)
Anion gap: 9 (ref 5–15)
BILIRUBIN TOTAL: 0.6 mg/dL (ref 0.3–1.2)
BUN: 58 mg/dL — ABNORMAL HIGH (ref 8–23)
CALCIUM: 8.7 mg/dL — AB (ref 8.9–10.3)
CO2: 25 mmol/L (ref 22–32)
Chloride: 104 mmol/L (ref 98–111)
Creatinine, Ser: 2.45 mg/dL — ABNORMAL HIGH (ref 0.44–1.00)
GFR, EST AFRICAN AMERICAN: 21 mL/min — AB (ref >60)
GFR, EST NON AFRICAN AMERICAN: 18 mL/min — AB (ref >60)
Glucose, Bld: 114 mg/dL — ABNORMAL HIGH (ref 70–99)
POTASSIUM: 3.9 mmol/L (ref 3.5–5.1)
Sodium: 138 mmol/L (ref 135–145)
TOTAL PROTEIN: 7.3 g/dL (ref 6.5–8.1)

## 2018-06-04 LAB — T4, FREE: Free T4: 1.12 ng/dL (ref 0.82–1.77)

## 2018-06-04 LAB — FERRITIN: FERRITIN: 579 ng/mL — AB (ref 11–307)

## 2018-06-04 LAB — PREPARE RBC (CROSSMATCH)

## 2018-06-04 LAB — MRSA PCR SCREENING: MRSA by PCR: NEGATIVE

## 2018-06-04 LAB — BRAIN NATRIURETIC PEPTIDE: B NATRIURETIC PEPTIDE 5: 1059 pg/mL — AB (ref 0.0–100.0)

## 2018-06-04 LAB — TSH: TSH: 2.155 u[IU]/mL (ref 0.350–4.500)

## 2018-06-04 LAB — ABO/RH: ABO/RH(D): A POS

## 2018-06-04 MED ORDER — ISOSORBIDE MONONITRATE ER 30 MG PO TB24
15.0000 mg | ORAL_TABLET | Freq: Every day | ORAL | Status: DC
  Administered 2018-06-04 – 2018-06-05 (×2): 15 mg via ORAL
  Filled 2018-06-04 (×3): qty 1

## 2018-06-04 MED ORDER — SODIUM CHLORIDE 0.9 % IV SOLN
200.0000 mg | INTRAVENOUS | Status: DC
  Administered 2018-06-04: 200 mg via INTRAVENOUS
  Filled 2018-06-04 (×2): qty 10

## 2018-06-04 MED ORDER — SODIUM CHLORIDE 0.9 % IV SOLN
1.0000 g | INTRAVENOUS | Status: DC
  Administered 2018-06-04 – 2018-06-05 (×2): 1 g via INTRAVENOUS
  Filled 2018-06-04 (×2): qty 1

## 2018-06-04 MED ORDER — OCUVITE-LUTEIN PO CAPS
1.0000 | ORAL_CAPSULE | Freq: Every day | ORAL | Status: DC
  Administered 2018-06-04 – 2018-06-05 (×2): 1 via ORAL
  Filled 2018-06-04 (×2): qty 1

## 2018-06-04 MED ORDER — LABETALOL HCL 5 MG/ML IV SOLN
10.0000 mg | INTRAVENOUS | Status: DC | PRN
  Administered 2018-06-04 – 2018-06-05 (×3): 10 mg via INTRAVENOUS
  Filled 2018-06-04 (×3): qty 4

## 2018-06-04 MED ORDER — LABETALOL HCL 5 MG/ML IV SOLN
20.0000 mg | Freq: Once | INTRAVENOUS | Status: AC
  Administered 2018-06-04: 20 mg via INTRAVENOUS
  Filled 2018-06-04: qty 4

## 2018-06-04 MED ORDER — ONDANSETRON HCL 4 MG/2ML IJ SOLN: 4.0000 mg | Freq: Four times a day (QID) | INTRAMUSCULAR | Status: DC | PRN

## 2018-06-04 MED ORDER — ONDANSETRON HCL 4 MG PO TABS: 4.0000 mg | ORAL_TABLET | Freq: Four times a day (QID) | ORAL | Status: DC | PRN

## 2018-06-04 MED ORDER — INFLUENZA VAC SPLIT HIGH-DOSE 0.5 ML IM SUSY
0.5000 mL | PREFILLED_SYRINGE | INTRAMUSCULAR | Status: DC
  Filled 2018-06-04: qty 0.5

## 2018-06-04 MED ORDER — VANCOMYCIN HCL IN DEXTROSE 1-5 GM/200ML-% IV SOLN
1000.0000 mg | INTRAVENOUS | Status: DC
  Administered 2018-06-04: 1000 mg via INTRAVENOUS
  Filled 2018-06-04: qty 200

## 2018-06-04 MED ORDER — METRONIDAZOLE IN NACL 5-0.79 MG/ML-% IV SOLN
500.0000 mg | Freq: Three times a day (TID) | INTRAVENOUS | Status: DC
  Administered 2018-06-04 – 2018-06-05 (×3): 500 mg via INTRAVENOUS
  Filled 2018-06-04 (×4): qty 100

## 2018-06-04 MED ORDER — VANCOMYCIN HCL IN DEXTROSE 1-5 GM/200ML-% IV SOLN
1000.0000 mg | Freq: Once | INTRAVENOUS | Status: AC
  Administered 2018-06-04: 1000 mg via INTRAVENOUS
  Filled 2018-06-04: qty 200

## 2018-06-04 MED ORDER — ACETAMINOPHEN 325 MG PO TABS: 650.0000 mg | ORAL_TABLET | Freq: Four times a day (QID) | ORAL | Status: DC | PRN

## 2018-06-04 MED ORDER — SODIUM CHLORIDE 0.9% IV SOLUTION
Freq: Once | INTRAVENOUS | Status: AC
  Administered 2018-06-04: 17:00:00 via INTRAVENOUS

## 2018-06-04 MED ORDER — OXYCODONE-ACETAMINOPHEN 5-325 MG PO TABS
1.0000 | ORAL_TABLET | ORAL | Status: DC | PRN
  Administered 2018-06-04: 1 via ORAL
  Filled 2018-06-04 (×2): qty 1

## 2018-06-04 MED ORDER — HEPARIN SODIUM (PORCINE) 5000 UNIT/ML IJ SOLN
5000.0000 [IU] | Freq: Three times a day (TID) | INTRAMUSCULAR | Status: DC
  Administered 2018-06-04: 5000 [IU] via SUBCUTANEOUS
  Filled 2018-06-04: qty 1

## 2018-06-04 MED ORDER — CLONIDINE HCL 0.1 MG PO TABS
0.1000 mg | ORAL_TABLET | Freq: Two times a day (BID) | ORAL | Status: DC
  Administered 2018-06-04 – 2018-06-05 (×3): 0.1 mg via ORAL
  Filled 2018-06-04 (×3): qty 1

## 2018-06-04 MED ORDER — LABETALOL HCL 100 MG PO TABS
100.0000 mg | ORAL_TABLET | Freq: Two times a day (BID) | ORAL | Status: DC
  Administered 2018-06-04 – 2018-06-05 (×3): 100 mg via ORAL
  Filled 2018-06-04 (×4): qty 1

## 2018-06-04 MED ORDER — DOCUSATE SODIUM 100 MG PO CAPS
100.0000 mg | ORAL_CAPSULE | Freq: Two times a day (BID) | ORAL | Status: DC
  Administered 2018-06-05 (×2): 100 mg via ORAL
  Filled 2018-06-04 (×2): qty 1

## 2018-06-04 MED ORDER — NITROGLYCERIN 2 % TD OINT
1.0000 [in_us] | TOPICAL_OINTMENT | Freq: Four times a day (QID) | TRANSDERMAL | Status: DC
  Administered 2018-06-04 (×2): 1 [in_us] via TOPICAL
  Filled 2018-06-04 (×2): qty 1

## 2018-06-04 MED ORDER — ACETAMINOPHEN 650 MG RE SUPP: 650.0000 mg | Freq: Four times a day (QID) | RECTAL | Status: DC | PRN

## 2018-06-04 MED ORDER — LEVOTHYROXINE SODIUM 112 MCG PO TABS
112.0000 ug | ORAL_TABLET | Freq: Every day | ORAL | Status: DC
  Administered 2018-06-04 – 2018-06-05 (×2): 112 ug via ORAL
  Filled 2018-06-04 (×3): qty 1

## 2018-06-04 MED ORDER — MORPHINE SULFATE (PF) 2 MG/ML IV SOLN
1.0000 mg | INTRAVENOUS | Status: DC | PRN
  Administered 2018-06-04: 1 mg via INTRAVENOUS
  Administered 2018-06-05: 2 mg via INTRAVENOUS
  Filled 2018-06-04 (×2): qty 1

## 2018-06-04 MED ORDER — PREMIER PROTEIN SHAKE
11.0000 [oz_av] | Freq: Two times a day (BID) | ORAL | Status: DC
  Administered 2018-06-04: 11 [oz_av] via ORAL

## 2018-06-04 MED ORDER — VITAMIN C 500 MG PO TABS
250.0000 mg | ORAL_TABLET | Freq: Two times a day (BID) | ORAL | Status: DC
  Administered 2018-06-04 – 2018-06-05 (×3): 250 mg via ORAL
  Filled 2018-06-04 (×4): qty 0.5

## 2018-06-04 MED ORDER — NICARDIPINE HCL IN NACL 20-0.86 MG/200ML-% IV SOLN
3.0000 mg/h | INTRAVENOUS | Status: DC
  Administered 2018-06-04: 7.5 mg/h via INTRAVENOUS
  Administered 2018-06-04: 3 mg/h via INTRAVENOUS
  Filled 2018-06-04 (×2): qty 200

## 2018-06-04 MED ORDER — LOSARTAN POTASSIUM 50 MG PO TABS
100.0000 mg | ORAL_TABLET | Freq: Every day | ORAL | Status: DC
  Administered 2018-06-04 – 2018-06-05 (×2): 100 mg via ORAL
  Filled 2018-06-04 (×2): qty 2

## 2018-06-04 NOTE — Consult Note (Addendum)
Name: Taylor Tyler MRN: [id number] DOB: 1941/07/30    ADMISSION DATE:  06/04/2018 CONSULTATION DATE:  06/04/2018  REFERRING MD :  Dr. Marcille Tyler   CHIEF COMPLAINT:  Hypertensive Emergency  BRIEF PATIENT DESCRIPTION:  77 y.o. Female admitted with Hypertensive Emergency requiring Nicardipine drip,  AKI on CKD III, and tunneling wound/abcess to her back.  SIGNIFICANT EVENTS  06/04/18>> Admission to [place]:  DG Lumbar Spine 06/04/18>> No acute fracture or subluxation of the lumbar spine. Stable postoperative changes. A pocket of air over the hardware, likely superimposed bowel. CT Left Hip 06/04/18>> CT Abdomen / Pelvis 06/04/18>>   CULTURES: Wound culture from back 06/04/18>>  ANTIBIOTICS: Vancomycin 06/04/18>> Zosyn 06/04/18  HISTORY OF PRESENT ILLNESS:   [name] is a 77 y.o. Female with a PMH as listed below, who presents to [facility] ED on 06/04/18 with c/o left hip pain and bilateral Lower extremity edema.  Upon presentation to the ED she was afebrile, with BP 218/63.  Initial workup in the ED revealed Creatinine 2.45, BNP 1059, Troponin 0.03, and WBC 17.7.  CXR was concerning for moderate cardiomegaly with minimal vascular congestion, no edema or focal consolidation noted. Pt reports that she has been compliant with her antihypertensive medications. X-ray of the lumbar spine was negative for acute fracture or subluxation of the lumbar spine, however air was noted over her previous hardware. Pt reports that she had a Lumbar fusion performed in July 2018, of which got infected and required debridement in August 2018.  Upon arrival to ICU, it is noted that her left hip is reddened and painful, and she has a tunneling wound/abcess to her lower lumbar spine region.    She is being admitted to [facility] unit for treatment of Hypertensive Emergency requiring Nicardipine drip,  AKI on CKD III, and wound/absess to her back.  PCCM is consulted for further management.  PAST MEDICAL  HISTORY :   has a past medical history of Acute blood loss anemia (03/14/2017), Acute renal failure superimposed on stage 3 chronic kidney disease ([place]) (03/14/2017), AKI (acute kidney injury) ([place]) (02/28/2017), Altered mental status (03/29/2017), Anemia, Aortic atherosclerosis ([place]), Arthritis, Bilateral lower extremity edema (03/04/2017), Bradycardia, Cataract, Chronic kidney disease, CKD (chronic kidney disease) stage 3, GFR 30-59 ml/min ([place]) (01/15/2017), Diabetes mellitus without complication ([place]), Disease of pancreas, Diverticulitis, Diverticulosis (01/15/2017), DJD (degenerative joint disease), DM (diabetes mellitus), type 2 with renal complications ([place]) (01/31/4764), Fatty liver, Hypertension, Hypothyroidism, Hypothyroidism, Kidney stone, Lethargy (02/28/2017), Obesity, Class III, BMI 40-49.9 (morbid obesity) ([place]) (02/27/2017), Pleural lipoma, Postoperative wound infection (04/02/2017), Spinal stenosis of lumbar region (01/15/2017), Status post lumbar surgery (02/24/2017), Ventral hernia, and Wound healing, delayed.  has a past surgical history that includes Cholecystectomy (1982); Lumbar laminectomy with spinous process plate 2 level (N/A, 4/65/0354); Gallbladder surgery (1982); Back surgery (2013); Eye surgery (Right, 2017); Lumbar fusion (02/24/2017); Anterior lumbar fusion (N/A, 02/24/2017); Anterior lat lumbar fusion (N/A, 02/24/2017); Abdominal exposure (N/A, 02/24/2017); Application of robotic assistance for spinal procedure (N/A, 02/26/2017); Lumbar wound debridement (N/A, 03/30/2017); Application if wound vac (N/A, 03/30/2017); Abdominal hysterectomy (1987); Partial colectomy; Ventral hernia repair; Appendectomy; Breast surgery (1992); Colon surgery (01/26/2014); and Colon surgery. Prior to Admission medications   Medication Sig Start Date End Date Taking? Authorizing Provider  acetaminophen (TYLENOL) 500 MG tablet Take 2 tablets (1,000 mg total) by mouth 3 (three) times daily. 04/04/17   Taylor Coombe, MD    atorvastatin (LIPITOR) 40 MG tablet Take 1 tablet (40 mg total) by mouth every evening.  02/11/18   Tyler, Taylor Glow, MD  blood glucose meter kit and supplies KIT Dispense based on patient and insurance preference. Use up to four times daily as directed. (FOR ICD-9 250.00, 250.01). 08/31/17   Tyler, Taylor Glow, MD  Cholecalciferol 1000 units capsule Take 1 capsule (1,000 Units total) by mouth daily. 08/06/17   Tyler, Taylor Glow, MD  cloNIDine (CATAPRES) 0.1 MG tablet Take 1 tablet (0.1 mg total) by mouth 2 (two) times daily. 01/29/18   Tyler, Taylor Glow, MD  diclofenac sodium (VOLTAREN) 1 % GEL Apply 4 g topically 4 (four) times daily. Knees b/l 01/05/18   Tyler, Taylor Glow, MD  doxycycline (VIBRA-TABS) 100 MG tablet Take 1 tablet (100 mg total) by mouth 2 (two) times daily. With food 04/19/18   Tyler, Taylor Glow, MD  ferrous sulfate 325 (65 FE) MG tablet Take 1 tablet (325 mg total) by mouth daily with lunch. 08/06/17   Tyler, Taylor Glow, MD  furosemide (LASIX) 40 MG tablet Take 1 tablet (40 mg total) by mouth daily. In am 01/25/18   Tyler, Taylor Glow, MD  gabapentin (NEURONTIN) 300 MG capsule Take 1 capsule (300 mg total) by mouth at bedtime. 01/05/18   Tyler, Taylor Glow, MD  labetalol (NORMODYNE) 100 MG tablet Take 100 mg by mouth 2 (two) times daily.  02/17/18 02/17/19  [provider]  levothyroxine (SYNTHROID, LEVOTHROID) 112 MCG tablet Take 1 tablet (112 mcg total) by mouth daily before breakfast. 04/28/18   Tyler, Taylor Glow, MD  losartan (COZAAR) 100 MG tablet Take 1 tablet (100 mg total) by mouth daily after breakfast. 02/11/18   Tyler, Taylor Glow, MD  Magnesium Oxide 400 MG CAPS Take 1 capsule (400 mg total) by mouth daily. 02/08/18   Tyler, Taylor Glow, MD  vitamin C (ASCORBIC ACID) 500 MG tablet Take 500 mg by mouth. Take one tablet twice daily to aid wound healing    [provider]   Allergies  Allergen  Reactions  . Clams [Shellfish Allergy] Swelling and Other (See Comments)    THROAT SWELLS NECK TURNS RED  . Hydralazine Other (See Comments)    CHEST TIGHTNESS  . Norvasc [Amlodipine Besylate]     Leg edema   . Milk-Related Compounds Diarrhea    FAMILY HISTORY:  family history includes Diabetes in her brother, brother, father, and mother; Heart attack in her father; Heart disease in her brother, brother, father, and mother; Hypertension in her father and mother; Kidney disease in her father and mother; Lupus in her sister and sister. SOCIAL HISTORY:  reports that she has never smoked. She has never used smokeless tobacco. She reports that she does not drink alcohol or use drugs.  REVIEW OF SYSTEMS:  Positives in BOLD Constitutional: Negative for fever, chills, weight loss, malaise/fatigue and diaphoresis.  HENT: Negative for hearing loss, ear pain, nosebleeds, congestion, sore throat, neck pain, tinnitus and ear discharge.   Eyes: Negative for blurred vision, double vision, photophobia, pain, discharge and redness.  Respiratory: Negative for cough, hemoptysis, sputum production, shortness of breath, wheezing and stridor.   Cardiovascular: Negative for chest pain, palpitations, orthopnea, claudication, +leg swelling and PND.  Gastrointestinal: Negative for heartburn, nausea, vomiting, abdominal pain, diarrhea, constipation, blood in stool and melena.  Genitourinary: Negative for dysuria, urgency, frequency, hematuria and flank pain.  Musculoskeletal: Negative for myalgias, +back pain,+ left hip pain,  joint pain and falls.  Skin: Negative for itching and rash.  Neurological: Negative for dizziness, tingling, tremors, sensory change, speech change, focal  weakness, seizures, loss of consciousness, weakness and headaches.  Endo/Heme/Allergies: Negative for environmental allergies and polydipsia. Does not bruise/bleed easily.  SUBJECTIVE:  Pt reports pain in left hip and back Wound/abcess  noted to lumbar region of back that is oozing pus Denies chest pain, shortness of breath, cough, sputum, fever, or chills On room air Nicardipine gtt infusing  VITAL SIGNS: Temp:  [97.9 F (36.6 C)] 97.9 F (36.6 C) (10/31 2357) Pulse Rate:  [60-89] 60 (11/01 0420) Resp:  [14-21] 17 (11/01 0420) BP: (206-252)/(61-86) 206/86 (11/01 0420) SpO2:  [96 %-100 %] 100 % (11/01 0420) Weight:  [95.3 kg] 95.3 kg (10/31 2353)  PHYSICAL EXAMINATION: General:  Acutely ill appearing female, laying in bed, on room air, in NAD Neuro:  Awake, A&O x4, follows commands, no focal deficits HEENT:  Atraumatic, normocephalic, neck supple, no JVD Cardiovascular:  RRR, s1s2, no M/R/G, 2+ radial pusles, 1+ pedal pulses Lungs:  Clear bilaterally, even, nonlabored, normal effort, no assessory muscle use Abdomen:  Obese, soft, nontender, nondistended, BS+ x4 Musculoskeletal:  No deformities, Left hip is reddened and painful to touch and movement, 3+ pitting edema bilateral LE Skin:  Tunneling Wound to lower back oozing pus  Recent Labs  Lab 06/04/18 0245  NA 138  K 3.9  CL 104  CO2 25  BUN 58*  CREATININE 2.45*  GLUCOSE 114*   Recent Labs  Lab 06/04/18 0245  HGB 7.7*  HCT 24.8*  WBC 17.7*  PLT 456*   Dg Chest 2 View  Result Date: 06/04/2018 CLINICAL DATA:  77 year old female with shortness of breath. EXAM: CHEST - 2 VIEW COMPARISON:  Chest radiograph dated 03/29/2017 FINDINGS: There is moderate cardiomegaly. Minimal vascular congestion. No edema. No focal consolidation, pleural effusion, or pneumothorax. No acute osseous pathology. IMPRESSION: Moderate cardiomegaly with minimal vascular congestion. No edema or focal consolidation. Electronically Signed   By: Anner Crete M.D.   On: 06/04/2018 04:01   Dg Lumbar Spine Complete  Result Date: 06/04/2018 CLINICAL DATA:  77 year old female with chronic lower back pain. EXAM: LUMBAR SPINE - COMPLETE 4+ VIEW COMPARISON:  Lumbar spine MRI dated  03/30/2018 FINDINGS: There is no acute fracture or subluxation of the lumbar spine. There is osteopenia with multilevel degenerative changes. L3-S1 disc spacers and posterior fixation rod and screws as well as L5-S1 anterior fusion noted. Overall these findings are similar to the radiograph of 03/09/2018. Apparent large pocket of air along the posterior lower lumbar spine at L4-S1 and along the fixation hardware likely represents superimposed bowel. Clinical correlation is recommended. There is atherosclerotic calcification of the aorta. IMPRESSION: 1. No acute fracture or subluxation of the lumbar spine. Stable postoperative changes. 2. A pocket of air over the hardware, likely superimposed bowel. Electronically Signed   By: Anner Crete M.D.   On: 06/04/2018 04:09    ASSESSMENT / PLAN:  CARDIAC A: Hypertensive Emergency Elevated BNP, in setting of AKI vs CHF -CXR with cardiomegaly and minimal vascular congestion and no edema Mildly elevated Troponin, likely demand ischemia P: Cardiac monitoring Nicardipine drip to maintain SBP 140 - 160 Continue PO Clonidine and Labetalol Follow BNP Will hold off on Diuresis for now given AKI and no pulmonary edema or Respiratory Failure Obtain Echocardiogram Trend Troponin  RENAL A: AKI on CKD III P: Monitor I&O's / urinary output Follow BMP Ensure adequate renal perfusion Avoid nephrotoxic agents as able Replace electrolytes as indicated Obtain Renal Ultrasound Consider Nephrology consult   INFECTIOUS A: Leukocytosis, questionableTunneling wound/abcess to lumbar region of  back s/p hard ware vs. Superimposed gas shadow of colon. P: Monitor fever curve Trend WBC's Obtain Wound culture Start Vancomycin & Zosyn Obtain CT Abdomen/Pelvis Surgery consulted, appreciate input Wound care consulted, appreciate input Obtain UA Monitor Procalcitonin and lactic acid  HEME A: Anemia without signs of active bleeding P: Monitor for S/Sx of  bleeding Trend CBC Will avoid pharmacologic VTE prophylaxis for now given pt's anemia; use SCD's Transfuse for Hgb <7 Check for occult stool    DISPOSITION: Stepdown GOALS OF CARE: Full code  VTE PROPHYLAXIS: SCD's UPDATES: Updated pt at bedside 06/04/18    Darel Hong, AGACNP-BC [facility] Pager: [phone] Cell: [phone]  06/04/2018, 4:28 AM   I agree with the documented

## 2018-06-04 NOTE — Progress Notes (Addendum)
Pharmacy Antibiotic Note  Taylor Tyler is a 77 y.o. female admitted on 06/04/2018 with wound infection/hypertensive urgency. Patient had lumbar fusion in 02/2017, which became infected and received debridement in 03/2017. Currently, she has tunneling wound/abscess on lower lumbar spine region. Patiently is AKI on CKD III; baseline serum creatinine from 04/2018 ~1.7. Pharmacy has been consulted for vancomycin and cefepime dosing.  Plan: Per CCM discussion, will initiate cefepime 1 g q24h and continue Vancomycin 1000 mg q36h.  Vancomycin Kinetics DW 68kg  Vd 48L kei 0.022 hr-1  T1/2 32 hours Vancomycin 1 gram q 36 hours ordered with stacked dosing. Level before 4th dose on 11/4 @ 1700. Goal trough 15-20.  Height: 5\' 2"  (157.5 cm) Weight: 210 lb (95.3 kg) IBW/kg (Calculated) : 50.1  Temp (24hrs), Avg:98.4 F (36.9 C), Min:97.9 F (36.6 C), Max:98.8 F (37.1 C)  Recent Labs  Lab 06/04/18 0245  WBC 17.7*  CREATININE 2.45*    Estimated Creatinine Clearance: 21.4 mL/min (A) (by C-G formula based on SCr of 2.45 mg/dL (H)).    Allergies  Allergen Reactions  . Clams [Shellfish Allergy] Swelling and Other (See Comments)    THROAT SWELLS NECK TURNS RED  . Hydralazine Other (See Comments)    CHEST TIGHTNESS  . Norvasc [Amlodipine Besylate] Swelling    Leg edema   . Milk-Related Compounds Diarrhea    Antimicrobials this admission: 11/1 Vancomycin >> 11/1 Cefepime >>  Dose adjustments this admission: N/A  Microbiology results: 11/1 MRSA PCR: (-) 11/1 Wound Cx pending  Thank you for allowing pharmacy to be a part of this patient's care.  Paticia Stack, PharmD Pharmacy Resident  06/04/2018 11:16 AM

## 2018-06-04 NOTE — Progress Notes (Addendum)
[place] Progress Note Patient Name: Taylor Tyler DOB: 07-Apr-1941 MRN: [id number]   Date of Service  06/04/2018  HPI/Events of Note  Presented with edema and weight gain. Hypertensive emergency now on cardene drip.  Recent back surgery with leak and possible abscess formation. Renal insufficiency, anemic.  eICU Interventions   BP control, diurese, 2D echo  Work up for anemia and hypothyroidism, may need to adjust medications.  Antibiotics and surgery consult  Patient lives alone, unsure about compliance with medications.     Intervention Category Major Interventions: Hypertension - evaluation and management;Infection - evaluation and management Intermediate Interventions: Bleeding - evaluation and treatment with blood products Evaluation Type: New Patient Evaluation  Judd Lien 06/04/2018, 6:14 AM

## 2018-06-04 NOTE — ED Notes (Signed)
RN unavailable for report

## 2018-06-04 NOTE — Progress Notes (Signed)
Dr. Lysle Pearl at bedside to evaluate wound to lower back.

## 2018-06-04 NOTE — ED Notes (Signed)
Notified MD Rifenbark pts BP 252/82

## 2018-06-04 NOTE — Progress Notes (Signed)
Pt off nicardipine gtt sbp 140-150's other vss will transfer to telemetry unit.  Surgical team consulted for lumbar spine wound/abscess she had a lumbar fusion in July 7225 complicated by infection at incision site requiring debridement in August 2018. Pt has developed a tunneling wound/abscess at previous lumbar spine surgical site, Dr. Lysle Pearl from general surgery has examined the pt.  MRI of lumbar spine pending based on results pt may require transfer to another facility for neurosurgery intervention.  I spoke with Dr. Tressia Miners regarding pt transfer to telemetry unit and possible need for transfer to another facility based on MRI Lumbar Spine findings.  Will continue to monitor and assess pt while in ICU. Once bed becomes available PCCM will sign off if you need further assistance please call on call pager# listed in [place].    Marda Stalker, [place] Pager [phone] (please enter 7 digits) PCCM Consult Pager [phone] (please enter 7 digits)

## 2018-06-04 NOTE — H&P (Signed)
Taylor Tyler is an 77 y.o. female.   Chief Complaint: General malaise HPI: The patient with past medical history of chronic kidney disease, diabetes, hypertension and hypothyroidism presents to the emergency department with general malaise as well as back pain.  The patient states that she "knew it was time to come to the hospital" but denied specific complaints to me.  She reports that her lower extremities have been swelling 3 to 4 weeks.  She denies shortness of breath or chest pain.  She mentions that she is unable to get comfortable in bed due to some pain on her left side.  Lamination of her side and hip revealed no wounds.  The patient also denies falls.  Her vital signs were notable for severely elevated blood pressure with systolic readings greater than 200.  She was given labetalol IV which did not differently improve blood pressure.  Thus the emergency department staff called the hospitalist service for admission.  Past Medical History:  Diagnosis Date  . Acute blood loss anemia 03/14/2017  . Acute renal failure superimposed on stage 3 chronic kidney disease ([place]) 03/14/2017  . AKI (acute kidney injury) ([place]) 02/28/2017  . Altered mental status 03/29/2017  . Anemia   . Aortic atherosclerosis ([place])   . Arthritis   . Bilateral lower extremity edema 03/04/2017  . Bradycardia   . Cataract   . Chronic kidney disease    ?? renal insufficiency,   . CKD (chronic kidney disease) stage 3, GFR 30-59 ml/min (HCC) 01/15/2017   ?? renal insufficiency, which she thinks is coming from "all these medications"  . Diabetes mellitus without complication ([place])    diagnosed 4-5 yrs ago  . Disease of pancreas   . Diverticulitis    s/p perforation and partial colectomy 01/27/14 with 3 benign lymph nodes   . Diverticulosis 01/15/2017  . DJD (degenerative joint disease)   . DM (diabetes mellitus), type 2 with renal complications ([place]) 8/54/6270  . Fatty liver   . Hypertension   . Hypothyroidism   .  Hypothyroidism    ? etiology autoimmune or acquired   . Kidney stone   . Lethargy 02/28/2017  . Obesity, Class III, BMI 40-49.9 (morbid obesity) ([place]) 02/27/2017  . Pleural lipoma   . Postoperative wound infection 04/02/2017  . Spinal stenosis of lumbar region 01/15/2017  . Status post lumbar surgery 02/24/2017  . Ventral hernia   . Wound healing, delayed    back    Past Surgical History:  Procedure Laterality Date  . ABDOMINAL EXPOSURE N/A 02/24/2017   Procedure: ABDOMINAL EXPOSURE;  Surgeon: Angelia Mould, MD;  Location: [place];  Service: Vascular;  Laterality: N/A;  . ABDOMINAL HYSTERECTOMY  1987   no h/o abnormal paps   . ANTERIOR LAT LUMBAR FUSION N/A 02/24/2017   Procedure: Lumbar three- five Anterior lateral lumbar interbody fusion;  Surgeon: Ditty, Kevan Ny, MD;  Location: [place];  Service: Neurosurgery;  Laterality: N/A;  L3-5 Anterior lateral lumbar interbody fusion with removal of coflex at L3-4, L4-5  . ANTERIOR LUMBAR FUSION N/A 02/24/2017   Procedure: Stage 1: Lumbar five-Sacral one Anterior lumbar interbody fusion;  Surgeon: Ditty, Kevan Ny, MD;  Location: [place] [facility];  Service: Neurosurgery;  Laterality: N/A;  Stage 1: L5-S1 Anterior lumbar interbody fusion  . APPENDECTOMY    . APPLICATION OF ROBOTIC ASSISTANCE FOR SPINAL PROCEDURE N/A 02/26/2017   Procedure: APPLICATION OF ROBOTIC ASSISTANCE FOR SPINAL PROCEDURE;  Surgeon: Ditty, Kevan Ny, MD;  Location: [place];  Service: Neurosurgery;  Laterality: N/A;  . APPLICATION OF WOUND VAC N/A 03/30/2017   Procedure: APPLICATION OF WOUND VAC;  Surgeon: Ditty, Kevan Ny, MD;  Location: [place];  Service: Neurosurgery;  Laterality: N/A;  . BACK SURGERY  2013  . BREAST SURGERY  1992   Breast Reduction  . CHOLECYSTECTOMY  1982  . COLON SURGERY  01/26/2014   desc.sigmoid colectomy and ventral hernia repair and splenic flexure mobilization  . COLON SURGERY    . EYE SURGERY Right 2017   cataracts  . [place]  . LUMBAR FUSION  02/24/2017  . LUMBAR LAMINECTOMY WITH SPINOUS PROCESS PLATE 2 LEVEL N/A 1/69/6789   Procedure: LUMBAR LAMINECTOMY/DECOMPRESSION MICRODISCECTOMY CoFlex;  Surgeon: Faythe Ghee, MD;  Location: [facility];  Service: Neurosurgery;  Laterality: N/A;  Lumbar three-four,Lumbar Four-Five Laminectomy with Coflex  . LUMBAR WOUND DEBRIDEMENT N/A 03/30/2017   Procedure: Lumbar wound exploration/debridement, placement of wound vac;  Surgeon: Ditty, Kevan Ny, MD;  Location: [place];  Service: Neurosurgery;  Laterality: N/A;  Lumbar wound exploration/debridement, placement of wound vac  . PARTIAL COLECTOMY     01/27/14 diverticulitis and 3 benign lymph nodes [facility] Dr. Pat Patrick   . VENTRAL HERNIA REPAIR      Family History  Problem Relation Age of Onset  . Diabetes Father   . Heart attack Father   . Hypertension Father   . Heart disease Father   . Kidney disease Father   . Diabetes Mother   . Hypertension Mother   . Heart disease Mother   . Kidney disease Mother   . Lupus Sister   . Heart disease Brother   . Diabetes Brother   . Heart disease Brother   . Diabetes Brother   . Lupus Sister    Social History:  reports that she has never smoked. She has never used smokeless tobacco. She reports that she does not drink alcohol or use drugs.  Allergies:  Allergies  Allergen Reactions  . Clams [Shellfish Allergy] Swelling and Other (See Comments)    THROAT SWELLS NECK TURNS RED  . Hydralazine Other (See Comments)    CHEST TIGHTNESS  . Norvasc [Amlodipine Besylate] Swelling    Leg edema   . Milk-Related Compounds Diarrhea    Medications Prior to Admission  Medication Sig Dispense Refill  . acetaminophen (TYLENOL) 500 MG tablet Take 2 tablets (1,000 mg total) by mouth 3 (three) times daily. 30 tablet 0  . Cholecalciferol 1000 units capsule Take 1 capsule (1,000 Units total) by mouth daily. 90 capsule 3  . cloNIDine (CATAPRES) 0.1 MG tablet Take 1 tablet (0.1 mg  total) by mouth 2 (two) times daily. 180 tablet 0  . diclofenac sodium (VOLTAREN) 1 % GEL Apply 4 g topically 4 (four) times daily. Knees b/l (Patient taking differently: Apply 4 g topically 4 (four) times daily as needed (pain). Knees b/l) 100 g 11  . ferrous sulfate 325 (65 FE) MG tablet Take 1 tablet (325 mg total) by mouth daily with lunch. 90 tablet 3  . furosemide (LASIX) 40 MG tablet Take 1 tablet (40 mg total) by mouth daily. In am 90 tablet 1  . gabapentin (NEURONTIN) 300 MG capsule Take 1 capsule (300 mg total) by mouth at bedtime. (Patient taking differently: Take 300 mg by mouth at bedtime as needed (pain). ) 90 capsule 1  . labetalol (NORMODYNE) 100 MG tablet Take 100 mg by mouth 2 (two) times daily.     Marland Kitchen levothyroxine (SYNTHROID,  LEVOTHROID) 112 MCG tablet Take 1 tablet (112 mcg total) by mouth daily before breakfast. 90 tablet 3  . losartan (COZAAR) 100 MG tablet Take 1 tablet (100 mg total) by mouth daily after breakfast. 90 tablet 1  . Magnesium Oxide 400 MG CAPS Take 1 capsule (400 mg total) by mouth daily. 90 capsule 3  . vitamin C (ASCORBIC ACID) 500 MG tablet Take 500 mg by mouth daily.     Marland Kitchen atorvastatin (LIPITOR) 40 MG tablet Take 1 tablet (40 mg total) by mouth every evening. (Patient not taking: Reported on 06/04/2018) 90 tablet 1  . blood glucose meter kit and supplies KIT Dispense based on patient and insurance preference. Use up to four times daily as directed. (FOR ICD-9 250.00, 250.01). 1 each 0  . doxycycline (VIBRA-TABS) 100 MG tablet Take 1 tablet (100 mg total) by mouth 2 (two) times daily. With food (Patient not taking: Reported on 06/04/2018) 14 tablet 0    Results for orders placed or performed during the hospital encounter of 06/04/18 (from the past 48 hour(s))  Comprehensive metabolic panel     Status: Abnormal   Collection Time: 06/04/18  2:45 AM  Result Value Ref Range   Sodium 138 135 - 145 mmol/L   Potassium 3.9 3.5 - 5.1 mmol/L   Chloride 104 98 - 111  mmol/L   CO2 25 22 - 32 mmol/L   Glucose, Bld 114 (H) 70 - 99 mg/dL   BUN 58 (H) 8 - 23 mg/dL   Creatinine, Ser 2.45 (H) 0.44 - 1.00 mg/dL   Calcium 8.7 (L) 8.9 - 10.3 mg/dL   Total Protein 7.3 6.5 - 8.1 g/dL   Albumin 2.3 (L) 3.5 - 5.0 g/dL   AST 17 15 - 41 U/L   ALT 12 0 - 44 U/L   Alkaline Phosphatase 75 38 - 126 U/L   Total Bilirubin 0.6 0.3 - 1.2 mg/dL   GFR calc non Af Amer 18 (L) >60 mL/min   GFR calc Af Amer 21 (L) >60 mL/min    Comment: (NOTE) The eGFR has been calculated using the CKD EPI equation. This calculation has not been validated in all clinical situations. eGFR's persistently <60 mL/min signify possible Chronic Kidney Disease.    Anion gap 9 5 - 15    Comment: Performed at [facility], [address] [place], [place] [address]  Brain natriuretic peptide     Status: Abnormal   Collection Time: 06/04/18  2:45 AM  Result Value Ref Range   B Natriuretic Peptide 1,059.0 (H) 0.0 - 100.0 pg/mL    Comment: Performed at [facility], [address] [place], [place] [address]  Troponin I     Status: Abnormal   Collection Time: 06/04/18  2:45 AM  Result Value Ref Range   Troponin I 0.03 (HH) <0.03 ng/mL    Comment: CRITICAL RESULT CALLED TO, READ BACK BY AND VERIFIED WITH SHERIE ALLISON _0  06/04/18 [facility] Performed at [facility], [address] [place], [place] [address]   CBC with Differential     Status: Abnormal   Collection Time: 06/04/18  2:45 AM  Result Value Ref Range   WBC 17.7 (H) 4.0 - 10.5 K/uL   RBC 2.93 (L) 3.87 - 5.11 MIL/uL   Hemoglobin 7.7 (L) 12.0 - 15.0 g/dL   HCT 24.8 (L) 36.0 - 46.0 %   MCV 84.6 80.0 - 100.0 fL   MCH 26.3 26.0 - 34.0 pg   MCHC 31.0  30.0 - 36.0 g/dL   RDW 15.9 (H) 11.5 - 15.5 %   Platelets 456 (H) 150 - 400 K/uL   nRBC 0.0 0.0 - 0.2 %   Neutrophils Relative % 80 %    Comment: CORRECTED ON 11/01 AT 0424: PREVIOUSLY REPORTED AS 0   Neutro Abs 13.4 (H) 1.7 - 7.7 K/uL    Comment: CORRECTED ON  11/01 AT 0424: PREVIOUSLY REPORTED AS 0.0   Band Neutrophils 7 %   Lymphocytes Relative 8 %    Comment: CORRECTED ON 11/01 AT 0424: PREVIOUSLY REPORTED AS 0   Lymphs Abs 1.2 0.7 - 4.0 K/uL    Comment: CORRECTED ON 11/01 AT 0424: PREVIOUSLY REPORTED AS 0.0   Monocytes Relative 4 %    Comment: CORRECTED ON 11/01 AT 0424: PREVIOUSLY REPORTED AS 0   Monocytes Absolute 0.6 0.1 - 1.0 K/uL    Comment: CORRECTED ON 11/01 AT 0424: PREVIOUSLY REPORTED AS 0.0   Eosinophils Relative 1 %    Comment: CORRECTED ON 11/01 AT 0424: PREVIOUSLY REPORTED AS 0   Eosinophils Absolute 0.1 0.0 - 0.5 K/uL    Comment: CORRECTED ON 11/01 AT 0424: PREVIOUSLY REPORTED AS 0.0   Basophils Relative 0 %   Basophils Absolute 0.0 0.0 - 0.1 K/uL   WBC Morphology MORPHOLOGY UNREMARKABLE    Smear Review PLATELETS APPEAR ADEQUATE    Dimorphism PRESENT    Polychromasia PRESENT    Target Cells PRESENT     Comment: Performed at [facility], [address] [place], [place] [address]  Protime-INR     Status: Abnormal   Collection Time: 06/04/18  6:36 AM  Result Value Ref Range   Prothrombin Time 16.8 (H) 11.4 - 15.2 seconds   INR 1.38     Comment: Performed at [facility], [address] [place], [place] [address]   Dg Chest 2 View  Result Date: 06/04/2018 CLINICAL DATA:  77 year old female with shortness of breath. EXAM: CHEST - 2 VIEW COMPARISON:  Chest radiograph dated 03/29/2017 FINDINGS: There is moderate cardiomegaly. Minimal vascular congestion. No edema. No focal consolidation, pleural effusion, or pneumothorax. No acute osseous pathology. IMPRESSION: Moderate cardiomegaly with minimal vascular congestion. No edema or focal consolidation. Electronically Signed   By: Anner Crete M.D.   On: 06/04/2018 04:01   Dg Lumbar Spine Complete  Result Date: 06/04/2018 CLINICAL DATA:  77 year old female with chronic lower back pain. EXAM: LUMBAR SPINE - COMPLETE 4+ VIEW COMPARISON:  Lumbar spine MRI  dated 03/30/2018 FINDINGS: There is no acute fracture or subluxation of the lumbar spine. There is osteopenia with multilevel degenerative changes. L3-S1 disc spacers and posterior fixation rod and screws as well as L5-S1 anterior fusion noted. Overall these findings are similar to the radiograph of 03/09/2018. Apparent large pocket of air along the posterior lower lumbar spine at L4-S1 and along the fixation hardware likely represents superimposed bowel. Clinical correlation is recommended. There is atherosclerotic calcification of the aorta. IMPRESSION: 1. No acute fracture or subluxation of the lumbar spine. Stable postoperative changes. 2. A pocket of air over the hardware, likely superimposed bowel. Electronically Signed   By: Anner Crete M.D.   On: 06/04/2018 04:09    Review of Systems  Constitutional: Negative for chills and fever.  HENT: Negative for sore throat and tinnitus.   Eyes: Negative for blurred vision and redness.  Respiratory: Negative for cough and shortness of breath.   Cardiovascular: Positive for leg swelling. Negative for chest pain, palpitations, orthopnea and PND.  Gastrointestinal: Negative for abdominal pain, diarrhea, nausea and vomiting.  Genitourinary: Negative for dysuria, frequency and urgency.  Musculoskeletal: Positive for back pain. Negative for joint pain and myalgias.  Skin: Negative for rash.       No lesions  Neurological: Negative for speech change, focal weakness and weakness.  Endo/Heme/Allergies: Does not bruise/bleed easily.       No temperature intolerance  Psychiatric/Behavioral: Negative for depression and suicidal ideas.    Blood pressure (!) 186/66, pulse 78, temperature 97.9 F (36.6 C), temperature source Oral, resp. rate 18, height 5' 2" (1.575 m), weight 95.3 kg, SpO2 99 %. Physical Exam  Vitals reviewed. Constitutional: She is oriented to person, place, and time. She appears well-developed and well-nourished. No distress.  HENT:   Head: Normocephalic and atraumatic.  Mouth/Throat: Oropharynx is clear and moist.  Eyes: Pupils are equal, round, and reactive to light. Conjunctivae and EOM are normal. No scleral icterus.  Neck: Normal range of motion. Neck supple. No JVD present. No tracheal deviation present. No thyromegaly present.  Cardiovascular: Normal rate, regular rhythm and normal heart sounds. Exam reveals no gallop and no friction rub.  No murmur heard. Respiratory: Effort normal and breath sounds normal.  GI: Soft. Bowel sounds are normal. She exhibits no distension. There is no tenderness.  Genitourinary:  Genitourinary Comments: Deferred  Musculoskeletal: Normal range of motion. She exhibits no edema.  Lymphadenopathy:    She has no cervical adenopathy.  Neurological: She is alert and oriented to person, place, and time. No cranial nerve deficit. She exhibits normal muscle tone.  Skin: Skin is warm and dry. No rash noted. No erythema.  Psychiatric: She has a normal mood and affect. Her behavior is normal. Judgment and thought content normal.     Assessment/Plan This is a 77 year old female admitted for hypertensive emergency. 1.  Hypertensive emergency: Significant and worsening of kidney function.  The patient reports compliance with her clonidine thus this does not appear to be rebound hypertension.  I have started the patient on a nicardipine drip and applied Nitropaste to her chest.  Hold losartan. 2.  Back pain: Prior to transport the nursing staff was able to roll the patient to find a purulent wound in her sacral region.  Start antibiotics for test and cellulitis. 3.  Hypothyroidism: Check TSH; continue Synthroid 4.  Anasarca: BNP elevated indicating heart failure.  Likely secondary to hypertensive cardiomyopathy.  Obtain echo once pressure is improved.  Monitor for diuresis once blood pressure has decreased.  Dose Lasix accordingly. 5.  DVT prophylaxis: Heparin 6.  GI prophylaxis: None Exline the  patient is a full code.  Time spent on admission orders and patient care approximately 45 minutes  Harrie Foreman, MD 06/04/2018, 7:32 AM

## 2018-06-04 NOTE — Progress Notes (Signed)
Pharmacy Antibiotic Note  Taylor Tyler is a 77 y.o. female admitted on 06/04/2018 with wound infection.  Pharmacy has been consulted for vancomycin dosing.  Plan: DW 68kg  Vd 48L kei 0.022 hr-1  T1/2 32 hours Vancomycin 1 gram q 36 hours ordered with stacked dosing. Level before 4th dose. Goal trough 15-20.  Height: 5\' 2"  (157.5 cm) Weight: 210 lb (95.3 kg) IBW/kg (Calculated) : 50.1  Temp (24hrs), Avg:97.9 F (36.6 C), Min:97.9 F (36.6 C), Max:97.9 F (36.6 C)  Recent Labs  Lab 06/04/18 0245  WBC 17.7*  CREATININE 2.45*    Estimated Creatinine Clearance: 21.4 mL/min (A) (by C-G formula based on SCr of 2.45 mg/dL (H)).    Allergies  Allergen Reactions  . Clams [Shellfish Allergy] Swelling and Other (See Comments)    THROAT SWELLS NECK TURNS RED  . Hydralazine Other (See Comments)    CHEST TIGHTNESS  . Norvasc [Amlodipine Besylate] Swelling    Leg edema   . Milk-Related Compounds Diarrhea    Antimicrobials this admission: vancomycin 11/1 >>    >>   Dose adjustments this admission:   Microbiology results: 11/1 MRSA PCR: pending  Thank you for allowing pharmacy to be a part of this patient's care.  Deshonna Trnka S 06/04/2018 6:33 AM

## 2018-06-04 NOTE — Consult Note (Addendum)
Subjective:   CC: back wound  HPI:  Taylor Tyler is a 77 y.o. female who was consulted by [facility] for evaluation of  Back wound. First noted a few weeks ago, as a drainage on her bed sheets.  She was unable to figure out where the drainage was coming from.  Describes sharp pain in the surrounding area at that continues down bilateral lower extremities.  The pain may potentially be from increased swelling that she is also noted around the same time she started noticing the increase drainage.  Otherwise, she denies any fevers, loss of appetite, loss of sensation or motor strength in her bilateral lower extremities.  She was admitted for hypertensive urgency and currently in the ICU.  Surgery was consulted due to a wound noted in her lower lumbar area along her former incision site from previous lumbar surgery performed by Dr. Cyndy Freeze in [place].  She developed a postoperative wound infection that required debridement and then wound care with a wound VAC placement, managed by infectious diseases Dr. Megan Salon.  Patient states that afterwards she was asymptomatic and has not had any issues in the area until few weeks ago.   Past Medical History:  has a past medical history of Acute blood loss anemia (03/14/2017), Acute renal failure superimposed on stage 3 chronic kidney disease ([place]) (03/14/2017), AKI (acute kidney injury) ([place]) (02/28/2017), Altered mental status (03/29/2017), Anemia, Aortic atherosclerosis ([place]), Arthritis, Bilateral lower extremity edema (03/04/2017), Bradycardia, Cataract, Chronic kidney disease, CKD (chronic kidney disease) stage 3, GFR 30-59 ml/min ([place]) (01/15/2017), Diabetes mellitus without complication ([place]), Disease of pancreas, Diverticulitis, Diverticulosis (01/15/2017), DJD (degenerative joint disease), DM (diabetes mellitus), type 2 with renal complications ([place]) (1/61/0960), Fatty liver, Hypertension, Hypothyroidism, Hypothyroidism, Kidney stone, Lethargy (02/28/2017), Obesity, Class  III, BMI 40-49.9 (morbid obesity) ([place]) (02/27/2017), Pleural lipoma, Postoperative wound infection (04/02/2017), Spinal stenosis of lumbar region (01/15/2017), Status post lumbar surgery (02/24/2017), Ventral hernia, and Wound healing, delayed.  Past Surgical History:  has a past surgical history that includes Cholecystectomy (1982); Lumbar laminectomy with spinous process plate 2 level (N/A, 4/54/0981); Gallbladder surgery (1982); Back surgery (2013); Eye surgery (Right, 2017); Lumbar fusion (02/24/2017); Anterior lumbar fusion (N/A, 02/24/2017); Anterior lat lumbar fusion (N/A, 02/24/2017); Abdominal exposure (N/A, 02/24/2017); Application of robotic assistance for spinal procedure (N/A, 02/26/2017); Lumbar wound debridement (N/A, 03/30/2017); Application if wound vac (N/A, 03/30/2017); Abdominal hysterectomy (1987); Partial colectomy; Ventral hernia repair; Appendectomy; Breast surgery (1992); Colon surgery (01/26/2014); and Colon surgery.  Family History: family history includes Diabetes in her brother, brother, father, and mother; Heart attack in her father; Heart disease in her brother, brother, father, and mother; Hypertension in her father and mother; Kidney disease in her father and mother; Lupus in her sister and sister.  Social History:  reports that she has never smoked. She has never used smokeless tobacco. She reports that she does not drink alcohol or use drugs.  Current Medications:  Medications Prior to Admission  Medication Sig Dispense Refill  . acetaminophen (TYLENOL) 500 MG tablet Take 2 tablets (1,000 mg total) by mouth 3 (three) times daily. 30 tablet 0  . Cholecalciferol 1000 units capsule Take 1 capsule (1,000 Units total) by mouth daily. 90 capsule 3  . cloNIDine (CATAPRES) 0.1 MG tablet Take 1 tablet (0.1 mg total) by mouth 2 (two) times daily. 180 tablet 0  . diclofenac sodium (VOLTAREN) 1 % GEL Apply 4 g topically 4 (four) times daily. Knees b/l (Patient taking differently: Apply 4  g topically 4 (four) times  daily as needed (pain). Knees b/l) 100 g 11  . ferrous sulfate 325 (65 FE) MG tablet Take 1 tablet (325 mg total) by mouth daily with lunch. 90 tablet 3  . furosemide (LASIX) 40 MG tablet Take 1 tablet (40 mg total) by mouth daily. In am 90 tablet 1  . gabapentin (NEURONTIN) 300 MG capsule Take 1 capsule (300 mg total) by mouth at bedtime. (Patient taking differently: Take 300 mg by mouth at bedtime as needed (pain). ) 90 capsule 1  . labetalol (NORMODYNE) 100 MG tablet Take 100 mg by mouth 2 (two) times daily.     Marland Kitchen levothyroxine (SYNTHROID, LEVOTHROID) 112 MCG tablet Take 1 tablet (112 mcg total) by mouth daily before breakfast. 90 tablet 3  . losartan (COZAAR) 100 MG tablet Take 1 tablet (100 mg total) by mouth daily after breakfast. 90 tablet 1  . Magnesium Oxide 400 MG CAPS Take 1 capsule (400 mg total) by mouth daily. 90 capsule 3  . vitamin C (ASCORBIC ACID) 500 MG tablet Take 500 mg by mouth daily.     Marland Kitchen atorvastatin (LIPITOR) 40 MG tablet Take 1 tablet (40 mg total) by mouth every evening. (Patient not taking: Reported on 06/04/2018) 90 tablet 1  . blood glucose meter kit and supplies KIT Dispense based on patient and insurance preference. Use up to four times daily as directed. (FOR ICD-9 250.00, 250.01). 1 each 0  . doxycycline (VIBRA-TABS) 100 MG tablet Take 1 tablet (100 mg total) by mouth 2 (two) times daily. With food (Patient not taking: Reported on 06/04/2018) 14 tablet 0    Allergies:  Allergies  Allergen Reactions  . Clams [Shellfish Allergy] Swelling and Other (See Comments)    THROAT SWELLS NECK TURNS RED  . Hydralazine Other (See Comments)    CHEST TIGHTNESS  . Norvasc [Amlodipine Besylate] Swelling    Leg edema   . Milk-Related Compounds Diarrhea    ROS:  A 15 point review of systems was performed and pertinent positives and negatives noted in HPI   Objective:     BP (!) 144/77   Pulse 72   Temp 98.8 F (37.1 C)   Resp (!) 23   Ht  '5\' 2"'  (1.575 m)   Wt 95.3 kg   SpO2 92%   BMI 38.41 kg/m   Constitutional :  alert, cooperative, appears stated age and no distress  Lymphatics/Throat:  no asymmetry, masses, or scars  Respiratory:  clear to auscultation bilaterally  Cardiovascular:  regular rate and rhythm  Gastrointestinal: soft, non-tender; bowel sounds normal; no masses,  no organomegaly.  Musculoskeletal: Steady gait and movement  Skin: Cool and moist, diffuse lower extremity pitting edema that is tender to palpation throughout in her lower extremities, left greater than right, as well as the low lumbar area where there is noted to be a 2 cm x 1 cm open wound in the midline over former lumbar incision site.  Probing of the wound noted to be tracking straight deep approximately 6-1/2 to 7 cm until the Conty probe encountered a very hard surface.  Area was continuously draining what seemed like thinned out purulent drainage in [place] to the surrounding edema.  Psychiatric: Normal affect, non-agitated, not confused       LABS:  CMP Latest Ref Rng & Units 06/04/2018 06/04/2018 04/19/2018  Glucose 70 - 99 mg/dL 199(H) 114(H) 101(H)  BUN 8 - 23 mg/dL 51(H) 58(H) 27(H)  Creatinine 0.44 - 1.00 mg/dL 2.36(H) 2.45(H) 1.69(H)  Sodium 135 -  145 mmol/L 137 138 136  Potassium 3.5 - 5.1 mmol/L 4.3 3.9 3.5  Chloride 98 - 111 mmol/L 104 104 101  CO2 22 - 32 mmol/L '24 25 30  ' Calcium 8.9 - 10.3 mg/dL 8.3(L) 8.7(L) 8.6  Total Protein 6.5 - 8.1 g/dL - 7.3 -  Total Bilirubin 0.3 - 1.2 mg/dL - 0.6 -  Alkaline Phos 38 - 126 U/L - 75 -  AST 15 - 41 U/L - 17 -  ALT 0 - 44 U/L - 12 -   CBC Latest Ref Rng & Units 06/04/2018 06/04/2018 04/19/2018  WBC 4.0 - 10.5 K/uL 18.8(H) 17.7(H) 11.6(H)  Hemoglobin 12.0 - 15.0 g/dL 6.8(L) 7.7(L) 8.6 Repeated and verified X2.(L)  Hematocrit 36.0 - 46.0 % 21.3(L) 24.8(L) 26.8(L)  Platelets 150 - 400 K/uL 355 456(H) 382.0    RADS: Pending MRI  Assessment:     lower back wound   Plan:   Unsure if  this is a new infection or a persistent issue from her previous surgery back in 2018.  Patient is a relatively poor historian and also has chronic medical issues.  Regardless of the chronicity of this wound, recommend further imaging to delineate full extent of it.  She is currently pending an MRI per critical care team to see if there is osteomyelitis and involvement of her spine.  We will follow-up on MRI results, but likely will need to be transferred to the neurosurgery group for further management.  Agree with ID consult at this time.  Further medical management for her other medical issues per ICU team  ADDENDUM: Noted MRI results.  Due to paraspinal muscle involvement, I recommended to NP in charge of ICU that neurosurgery be called for further management of the wound.  She verbalized understanding and states she will get in touch with them tonight.  General Surgery will sign off once neurosurgery takes over care.

## 2018-06-04 NOTE — ED Notes (Signed)
ED TO INPATIENT HANDOFF REPORT  Name/Age/Gender Taylor Tyler 77 y.o. female  Code Status Code Status History    Date Active Date Inactive Code Status Order ID Comments User Context   03/29/2017 2104 04/07/2017 2125 Full Code [id number]  Everrett Coombe, MD Inpatient   02/24/2017 1450 03/03/2017 1532 Full Code [id number]  Elpidio Eric Inpatient   02/24/2017 1450 02/24/2017 1450 Full Code [id number]  Elpidio Eric Inpatient   01/15/2017 1145 01/16/2017 2314 Full Code [id number]  Samella Parr, NP Inpatient   07/06/2016 1908 07/10/2016 1736 Full Code [id number]  Nicholes Mango, MD Inpatient      Home/SNF/Other Home  Chief Complaint Back Pain  Level of Care/Admitting Diagnosis ED Disposition    ED Disposition Condition [place]: [place] [[id number]]  Level of Care: Stepdown [14]  Diagnosis: Hypertensive emergency [phone]  Admitting Physician: Harrie Foreman [[id number]]  Attending Physician: Harrie Foreman [[id number]]  Estimated length of stay: past midnight tomorrow  Certification:: I certify this patient will need inpatient services for at least 2 midnights  PT Class (Do Not Modify): Inpatient [101]  PT Acc Code (Do Not Modify): Private [1]       Medical History Past Medical History:  Diagnosis Date  . Acute blood loss anemia 03/14/2017  . Acute renal failure superimposed on stage 3 chronic kidney disease ([place]) 03/14/2017  . AKI (acute kidney injury) ([place]) 02/28/2017  . Altered mental status 03/29/2017  . Anemia   . Aortic atherosclerosis ([place])   . Arthritis   . Bilateral lower extremity edema 03/04/2017  . Bradycardia   . Cataract   . Chronic kidney disease    ?? renal insufficiency,   . CKD (chronic kidney disease) stage 3, GFR 30-59 ml/min ([facility]) 01/15/2017   ?? renal insufficiency, which she thinks is coming from "all these medications"  . Diabetes mellitus without complication ([place])    diagnosed 4-5 yrs ago  .  Disease of pancreas   . Diverticulitis    s/p perforation and partial colectomy 01/27/14 with 3 benign lymph nodes   . Diverticulosis 01/15/2017  . DJD (degenerative joint disease)   . DM (diabetes mellitus), type 2 with renal complications ([place]) 7/82/4235  . Fatty liver   . Hypertension   . Hypothyroidism   . Hypothyroidism    ? etiology autoimmune or acquired   . Kidney stone   . Lethargy 02/28/2017  . Obesity, Class III, BMI 40-49.9 (morbid obesity) ([place]) 02/27/2017  . Pleural lipoma   . Postoperative wound infection 04/02/2017  . Spinal stenosis of lumbar region 01/15/2017  . Status post lumbar surgery 02/24/2017  . Ventral hernia   . Wound healing, delayed    back    Allergies Allergies  Allergen Reactions  . Clams [Shellfish Allergy] Swelling and Other (See Comments)    THROAT SWELLS NECK TURNS RED  . Hydralazine Other (See Comments)    CHEST TIGHTNESS  . Norvasc [Amlodipine Besylate] Swelling    Leg edema   . Milk-Related Compounds Diarrhea    IV Location/Drains/Wounds Patient Lines/Drains/Airways Status   Active Line/Drains/Airways    Name:   Placement date:   Placement time:   Site:   Days:   Arterial Line 02/26/17 Radial   02/26/17    0825    Radial   463   Peripheral IV 06/04/18 Right Antecubital   06/04/18    0248    Antecubital   less than 1  Peripheral IV 06/04/18 Right Hand   06/04/18    0442    Hand   less than 1   Negative Pressure Wound Therapy Back   03/30/17    -    -   431   Urethral Catheter Caitlin Holdson  Latex 14 Fr.   04/03/17    1839    Latex   427   Incision (Closed) 02/24/17 Abdomen   02/24/17    1047     465   Incision (Closed) 02/24/17 Flank   02/24/17    1308     465   Incision (Closed) 02/26/17 Back   02/26/17    1343     463   Incision (Closed) 03/29/17 Back Medial;Lower   03/29/17    2352     432   Incision (Closed) 03/30/17 Back Other (Comment)   03/30/17    1417     431   Pressure Injury 03/29/17 Unstageable - Full thickness tissue  loss in which the base of the ulcer is covered by slough (yellow, tan, gray, green or brown) and/or eschar (tan, brown or black) in the wound bed. white/yellow escher    03/29/17    2351     432   Wound / Incision (Open or Dehisced) 04/01/17 Abdomen Right;Lower;Distal linear opening yellow eschar noted. Bruised area dehisced   04/01/17    2347    Abdomen   429          Labs/Imaging Results for orders placed or performed during the hospital encounter of 06/04/18 (from the past 48 hour(s))  Comprehensive metabolic panel     Status: Abnormal   Collection Time: 06/04/18  2:45 AM  Result Value Ref Range   Sodium 138 135 - 145 mmol/L   Potassium 3.9 3.5 - 5.1 mmol/L   Chloride 104 98 - 111 mmol/L   CO2 25 22 - 32 mmol/L   Glucose, Bld 114 (H) 70 - 99 mg/dL   BUN 58 (H) 8 - 23 mg/dL   Creatinine, Ser 2.45 (H) 0.44 - 1.00 mg/dL   Calcium 8.7 (L) 8.9 - 10.3 mg/dL   Total Protein 7.3 6.5 - 8.1 g/dL   Albumin 2.3 (L) 3.5 - 5.0 g/dL   AST 17 15 - 41 U/L   ALT 12 0 - 44 U/L   Alkaline Phosphatase 75 38 - 126 U/L   Total Bilirubin 0.6 0.3 - 1.2 mg/dL   GFR calc non Af Amer 18 (L) >60 mL/min   GFR calc Af Amer 21 (L) >60 mL/min    Comment: (NOTE) The eGFR has been calculated using the CKD EPI equation. This calculation has not been validated in all clinical situations. eGFR's persistently <60 mL/min signify possible Chronic Kidney Disease.    Anion gap 9 5 - 15    Comment: Performed at [facility], [address] [place], [place] [address]  Brain natriuretic peptide     Status: Abnormal   Collection Time: 06/04/18  2:45 AM  Result Value Ref Range   B Natriuretic Peptide 1,059.0 (H) 0.0 - 100.0 pg/mL    Comment: Performed at [facility], [address] [place], [place] [address]  Troponin I     Status: Abnormal   Collection Time: 06/04/18  2:45 AM  Result Value Ref Range   Troponin I 0.03 (HH) <0.03 ng/mL    Comment: CRITICAL RESULT CALLED TO, READ BACK BY AND  VERIFIED WITH Torrian Canion '@0312'  06/04/18 [facility]LC Performed at  [facility], [address] [place], [place] [address]   CBC with Differential     Status: Abnormal   Collection Time: 06/04/18  2:45 AM  Result Value Ref Range   WBC 17.7 (H) 4.0 - 10.5 K/uL   RBC 2.93 (L) 3.87 - 5.11 MIL/uL   Hemoglobin 7.7 (L) 12.0 - 15.0 g/dL   HCT 24.8 (L) 36.0 - 46.0 %   MCV 84.6 80.0 - 100.0 fL   MCH 26.3 26.0 - 34.0 pg   MCHC 31.0 30.0 - 36.0 g/dL   RDW 15.9 (H) 11.5 - 15.5 %   Platelets 456 (H) 150 - 400 K/uL   nRBC 0.0 0.0 - 0.2 %   Neutrophils Relative % 80 %    Comment: CORRECTED ON 11/01 AT 0424: PREVIOUSLY REPORTED AS 0   Neutro Abs 13.4 (H) 1.7 - 7.7 K/uL    Comment: CORRECTED ON 11/01 AT 0424: PREVIOUSLY REPORTED AS 0.0   Band Neutrophils 7 %   Lymphocytes Relative 8 %    Comment: CORRECTED ON 11/01 AT 0424: PREVIOUSLY REPORTED AS 0   Lymphs Abs 1.2 0.7 - 4.0 K/uL    Comment: CORRECTED ON 11/01 AT 0424: PREVIOUSLY REPORTED AS 0.0   Monocytes Relative 4 %    Comment: CORRECTED ON 11/01 AT 0424: PREVIOUSLY REPORTED AS 0   Monocytes Absolute 0.6 0.1 - 1.0 K/uL    Comment: CORRECTED ON 11/01 AT 0424: PREVIOUSLY REPORTED AS 0.0   Eosinophils Relative 1 %    Comment: CORRECTED ON 11/01 AT 0424: PREVIOUSLY REPORTED AS 0   Eosinophils Absolute 0.1 0.0 - 0.5 K/uL    Comment: CORRECTED ON 11/01 AT 0424: PREVIOUSLY REPORTED AS 0.0   Basophils Relative 0 %   Basophils Absolute 0.0 0.0 - 0.1 K/uL   WBC Morphology MORPHOLOGY UNREMARKABLE    Smear Review PLATELETS APPEAR ADEQUATE    Dimorphism PRESENT    Polychromasia PRESENT    Target Cells PRESENT     Comment: Performed at [facility], [address], [place], [place] [address]   Dg Chest 2 View  Result Date: 06/04/2018 CLINICAL DATA:  77 year old female with shortness of breath. EXAM: CHEST - 2 VIEW COMPARISON:  Chest radiograph dated 03/29/2017 FINDINGS: There is moderate cardiomegaly. Minimal vascular congestion. No  edema. No focal consolidation, pleural effusion, or pneumothorax. No acute osseous pathology. IMPRESSION: Moderate cardiomegaly with minimal vascular congestion. No edema or focal consolidation. Electronically Signed   By: Anner Crete M.D.   On: 06/04/2018 04:01   Dg Lumbar Spine Complete  Result Date: 06/04/2018 CLINICAL DATA:  77 year old female with chronic lower back pain. EXAM: LUMBAR SPINE - COMPLETE 4+ VIEW COMPARISON:  Lumbar spine MRI dated 03/30/2018 FINDINGS: There is no acute fracture or subluxation of the lumbar spine. There is osteopenia with multilevel degenerative changes. L3-S1 disc spacers and posterior fixation rod and screws as well as L5-S1 anterior fusion noted. Overall these findings are similar to the radiograph of 03/09/2018. Apparent large pocket of air along the posterior lower lumbar spine at L4-S1 and along the fixation hardware likely represents superimposed bowel. Clinical correlation is recommended. There is atherosclerotic calcification of the aorta. IMPRESSION: 1. No acute fracture or subluxation of the lumbar spine. Stable postoperative changes. 2. A pocket of air over the hardware, likely superimposed bowel. Electronically Signed   By: Anner Crete M.D.   On: 06/04/2018 04:09    Pending Labs Unresulted Labs (From admission, onward)    Start  Ordered   06/04/18 0255  Urinalysis, Complete w Microscopic  STAT,   STAT     06/04/18 0254   Signed and Held  TSH  Add-on,   R     Signed and Held   Signed and Held  Troponin I  Now then every 6 hours,   R     Signed and Held          Vitals/Pain Today's Vitals   06/04/18 0340 06/04/18 0410 06/04/18 0420 06/04/18 0430  BP: (!) 210/61 (!) 225/71 (!) 206/86 (!) 206/88  Pulse: 62  60 76  Resp: '19 14 17 ' (!) 22  Temp:      TempSrc:      SpO2: 96%  100% 98%  Weight:      Height:      PainSc:        Isolation Precautions No active isolations  Medications Medications  nitroGLYCERIN (NITROGLYN) 2 %  ointment 1 inch (has no administration in time range)  nicardipine (CARDENE) 67m in 0.86% saline 2027mIV infusion (0.1 mg/ml) (3 mg/hr Intravenous New Bag/Given 06/04/18 0430)  labetalol (NORMODYNE,TRANDATE) injection 20 mg (20 mg Intravenous Given 06/04/18 0326)  labetalol (NORMODYNE,TRANDATE) injection 20 mg (20 mg Intravenous Given 06/04/18 0412)    Mobility walks with device

## 2018-06-04 NOTE — Progress Notes (Addendum)
[place] [facility] [place] NAME: Yaire Kreher    MR#:  [id number]  DATE OF BIRTH:  01/14/1941  SUBJECTIVE:  CHIEF COMPLAINT:   Chief Complaint  Patient presents with  . Back Pain   -Patient came this morning secondary to worsening weakness and low back pain and was noted to have elevated blood pressure.  Also noted increased swelling to her lower extremities.  REVIEW OF SYSTEMS:  Review of Systems  Constitutional: Positive for malaise/fatigue. Negative for chills and fever.  HENT: Negative for congestion, hearing loss and nosebleeds.   Eyes: Negative for blurred vision and double vision.  Respiratory: Negative for cough, shortness of breath and wheezing.   Cardiovascular: Negative for chest pain and palpitations.  Gastrointestinal: Negative for abdominal pain, constipation, diarrhea, nausea and vomiting.  Genitourinary: Negative for dysuria.  Musculoskeletal: Positive for back pain and myalgias.  Neurological: Negative for dizziness, focal weakness, seizures, weakness and headaches.  Psychiatric/Behavioral: Negative for depression.    DRUG ALLERGIES:   Allergies  Allergen Reactions  . Clams [Shellfish Allergy] Swelling and Other (See Comments)    THROAT SWELLS NECK TURNS RED  . Hydralazine Other (See Comments)    CHEST TIGHTNESS  . Norvasc [Amlodipine Besylate] Swelling    Leg edema   . Milk-Related Compounds Diarrhea    VITALS:  Blood pressure (!) 144/77, pulse 72, temperature 98.8 F (37.1 C), resp. rate (!) 23, height 5\' 2"  (1.575 m), weight 95.3 kg, SpO2 92 %.  PHYSICAL EXAMINATION:  Physical Exam   GENERAL:  77 y.o.-year-old obese patient lying in the bed with no acute distress.  EYES: Pupils equal, round, reactive to light and accommodation. No scleral icterus. Extraocular muscles intact.  HEENT: Head atraumatic, normocephalic. Oropharynx and nasopharynx clear.  NECK:  Supple, no jugular venous distention. No thyroid  enlargement, no tenderness.  LUNGS: Normal breath sounds bilaterally, no wheezing, rales,rhonchi or crepitation. No use of accessory muscles of respiration. Decreased at the bases CARDIOVASCULAR: S1, S2 normal. No murmurs, rubs, or gallops.  ABDOMEN: Soft, nontender, nondistended. Bowel sounds present. No organomegaly or mass.  EXTREMITIES: Significant edema of her thighs especially worse in the left groin and hip region.  No pedal edema, cyanosis, or clubbing.  NEUROLOGIC: Cranial nerves II through XII are intact. Muscle strength 5/5 in all extremities. Sensation intact. Gait not checked.  Global weakness noted. PSYCHIATRIC: The patient is alert and oriented x 3.  SKIN: No obvious rash, lesion, or ulcer.    LABORATORY PANEL:   CBC Recent Labs  Lab 06/04/18 1315  WBC 18.8*  HGB 6.8*  HCT 21.3*  PLT 355   ------------------------------------------------------------------------------------------------------------------  Chemistries  Recent Labs  Lab 06/04/18 0245 06/04/18 1315  NA 138 137  K 3.9 4.3  CL 104 104  CO2 25 24  GLUCOSE 114* 199*  BUN 58* 51*  CREATININE 2.45* 2.36*  CALCIUM 8.7* 8.3*  AST 17  --   ALT 12  --   ALKPHOS 75  --   BILITOT 0.6  --    ------------------------------------------------------------------------------------------------------------------  Cardiac Enzymes Recent Labs  Lab 06/04/18 1315  TROPONINI 0.07*   ------------------------------------------------------------------------------------------------------------------  RADIOLOGY:  Dg Chest 2 View  Result Date: 06/04/2018 CLINICAL DATA:  77 year old female with shortness of breath. EXAM: CHEST - 2 VIEW COMPARISON:  Chest radiograph dated 03/29/2017 FINDINGS: There is moderate cardiomegaly. Minimal vascular congestion. No edema. No focal consolidation, pleural effusion, or pneumothorax. No acute osseous pathology. IMPRESSION: Moderate cardiomegaly with minimal vascular congestion.  No  edema or focal consolidation. Electronically Signed   By: Anner Crete M.D.   On: 06/04/2018 04:01   Dg Lumbar Spine Complete  Result Date: 06/04/2018 CLINICAL DATA:  77 year old female with chronic lower back pain. EXAM: LUMBAR SPINE - COMPLETE 4+ VIEW COMPARISON:  Lumbar spine MRI dated 03/30/2018 FINDINGS: There is no acute fracture or subluxation of the lumbar spine. There is osteopenia with multilevel degenerative changes. L3-S1 disc spacers and posterior fixation rod and screws as well as L5-S1 anterior fusion noted. Overall these findings are similar to the radiograph of 03/09/2018. Apparent large pocket of air along the posterior lower lumbar spine at L4-S1 and along the fixation hardware likely represents superimposed bowel. Clinical correlation is recommended. There is atherosclerotic calcification of the aorta. IMPRESSION: 1. No acute fracture or subluxation of the lumbar spine. Stable postoperative changes. 2. A pocket of air over the hardware, likely superimposed bowel. Electronically Signed   By: Anner Crete M.D.   On: 06/04/2018 04:09   US Renal  Result Date: 06/04/2018 CLINICAL DATA:  Acute renal failure EXAM: RENAL / URINARY TRACT ULTRASOUND COMPLETE COMPARISON:  CT 03/29/2017 FINDINGS: Right Kidney: Renal measurements: 9.6 x 5.7 x 5.2 cm = volume: 142 mL. Diffusely increased echotexture. No mass or hydronephrosis. Left Kidney: Renal measurements: 9.1 x 4.4 x 3.4 cm = volume: 71 mL. Diffusely increased echotexture. No mass or hydronephrosis. Bladder: Not visualized, decompressed with Foley catheter in place. IMPRESSION: Atrophic, echogenic kidneys compatible with chronic medical renal disease. No hydronephrosis. Electronically Signed   By: Rolm Baptise M.D.   On: 06/04/2018 09:48    EKG:   Orders placed or performed during the hospital encounter of 06/04/18  . ED EKG  . ED EKG  . EKG 12-Lead  . EKG 12-Lead    ASSESSMENT AND PLAN:   77 year old female with multiple  medical problems including hypertension, low back surgery, CKD, diabetes and hypertension was admitted to hospital secondary to worsening low back pain and weakness.  1.  Lower back wound-patient had L3-L4, L4-L5 laminectomies done in August 2018, postoperatively had a wound infection with a wound VAC in place. -She says she has been doing well and ambulating with a cane at home up until recently.  Noticed increased lower extremity edema for almost a month.  Noted to have a lower back wound with expressing pus-appreciate surgical consult. -MRI has been ordered.  If the wound is tracking back into the vertebral column, will need neurosurgery consult. -ID consult has been requested -For now continue vancomycin and cefepime.  Her previous cultures grew Proteus based on the discharge summary  2.  Anemia-acute on chronic.  Has significant iron deficiency anemia. -Start IV iron.  GI consult for outpatient once the acute issues are taken care of -No active bleeding noted -Transfuse if hemoglobin is less than 7  3.  Acute renal failure on CKD-known CKD stage III with baseline creatinine around 1.5.  Creatinine now at 2.4.  Appears fluid overloaded. -Renal ultrasound showing atrophy kidneys.  Nephrology consult requested -Lasix is on hold for now  4.  Hypertensive urgency-off nicardipine drip.  Blood pressure is improved.  Continue clonidine, labetalol, losartan at this time  5.  Hypothyroidism-Synthroid  6.  DVT prophylaxis-teds and SCDs for now due to anemia   All the records are reviewed and case discussed with Care Management/Social Workerr. Management plans discussed with the patient, family and they are in agreement.  CODE STATUS: Full code  TOTAL TIME TAKING CARE OF  THIS PATIENT: 38 minutes.   POSSIBLE D/C IN 2 DAYS, DEPENDING ON CLINICAL CONDITION.   Gladstone Lighter M.D on 06/04/2018 at 2:36 PM  Between 7am to 6pm - Pager - [phone]  After 6pm go to [email or web address] - password  [facility]  Office  [phone]  CC: Primary care physician; McLean-Scocuzza, Nino Glow, MD

## 2018-06-04 NOTE — ED Provider Notes (Addendum)
[facility] Emergency Department Provider Note  ____________________________________________   First MD Initiated Contact with Patient 06/04/18 [phone]     (approximate)  I have reviewed the triage vital signs and the nursing notes.   HISTORY  Chief Complaint Back Pain   HPI Taylor Tyler is a 77 y.o. female who comes to the emergency department with 2 issues.  First she notes significant amount of swelling in her bilateral lower extremities for the past several weeks.  The symptoms have been slowly progressive are now moderate to severe.  She has unintentionally gained a large amount of weight.  She sleeps on 1-2 pillows.  She is able to lie completely flat and does not wake at night short of breath.   She also reports fullness in her lower abdomen.  Her secondary concern is that she had spinal fusion several months ago and for the past 2 weeks or so she has had thin clear fluid "leaking" from her back ever since.  She has not sought medical care since and has not called her surgeon.  She denies radicular symptoms.  No numbness or weakness.  No perianal numbness.  No bowel or bladder incontinence or hesitance.  She does have chronic shortness of breath that is slowly progressive and is now acutely worsened for the past 24 to 48 hours or so.  Seems to be worse with exertion and seems to be improved with rest.   Past Medical History:  Diagnosis Date  . Acute blood loss anemia 03/14/2017  . Acute renal failure superimposed on stage 3 chronic kidney disease ([place]) 03/14/2017  . AKI (acute kidney injury) ([place]) 02/28/2017  . Altered mental status 03/29/2017  . Anemia   . Aortic atherosclerosis ([place])   . Arthritis   . Bilateral lower extremity edema 03/04/2017  . Bradycardia   . Cataract   . Chronic kidney disease    ?? renal insufficiency,   . CKD (chronic kidney disease) stage 3, GFR 30-59 ml/min (HCC) 01/15/2017   ?? renal insufficiency, which she thinks is coming from  "all these medications"  . Diabetes mellitus without complication ([place])    diagnosed 4-5 yrs ago  . Disease of pancreas   . Diverticulitis    s/p perforation and partial colectomy 01/27/14 with 3 benign lymph nodes   . Diverticulosis 01/15/2017  . DJD (degenerative joint disease)   . DM (diabetes mellitus), type 2 with renal complications ([place]) 3/89/3734  . Fatty liver   . Hypertension   . Hypothyroidism   . Hypothyroidism    ? etiology autoimmune or acquired   . Kidney stone   . Lethargy 02/28/2017  . Obesity, Class III, BMI 40-49.9 (morbid obesity) ([place]) 02/27/2017  . Pleural lipoma   . Postoperative wound infection 04/02/2017  . Spinal stenosis of lumbar region 01/15/2017  . Status post lumbar surgery 02/24/2017  . Ventral hernia   . Wound healing, delayed    back    Patient Active Problem List   Diagnosis Date Noted  . Hypertensive emergency 06/04/2018  . Upper respiratory tract infection 04/19/2018  . Prediabetes 04/19/2018  . Neuropathy 01/08/2018  . Fatty liver 01/05/2018  . Chronic pain of both knees 01/05/2018  . Ventral hernia 01/05/2018  . Bradycardia 09/07/2017  . Hypomagnesemia 08/09/2017  . CKD (chronic kidney disease) stage 4, GFR 15-29 ml/min (HCC) 07/21/2017  . Edema 07/21/2017  . Vitamin D deficiency 07/21/2017  . Essential hypertension   . Altered consciousness   . Postoperative  wound infection 04/02/2017  . Urinary retention   . Wound dehiscence   . Diarrhea   . Altered mental status 03/29/2017  . Anemia 03/14/2017  . Acute blood loss anemia 03/14/2017  . Acute renal failure superimposed on stage 3 chronic kidney disease ([place]) 03/14/2017  . Bilateral lower extremity edema 03/04/2017  . AKI (acute kidney injury) ([place]) 02/28/2017  . Obesity, Class III, BMI 40-49.9 (morbid obesity) ([place]) 02/27/2017  . DM (diabetes mellitus), type 2 with renal complications ([place]) 02/63/7858  . Status post lumbar surgery 02/24/2017  . CKD (chronic kidney disease) stage  3, GFR 30-59 ml/min (HCC) 01/15/2017  . Hypothyroidism 01/15/2017  . Diverticulosis 01/15/2017  . HLD (hyperlipidemia) 01/15/2017  . Spinal stenosis of lumbar region 01/15/2017  . Disease of pancreas 07/21/2012    Past Surgical History:  Procedure Laterality Date  . ABDOMINAL EXPOSURE N/A 02/24/2017   Procedure: ABDOMINAL EXPOSURE;  Surgeon: Angelia Mould, MD;  Location: [place];  Service: Vascular;  Laterality: N/A;  . ABDOMINAL HYSTERECTOMY  1987   no h/o abnormal paps   . ANTERIOR LAT LUMBAR FUSION N/A 02/24/2017   Procedure: Lumbar three- five Anterior lateral lumbar interbody fusion;  Surgeon: Ditty, Kevan Ny, MD;  Location: [place];  Service: Neurosurgery;  Laterality: N/A;  L3-5 Anterior lateral lumbar interbody fusion with removal of coflex at L3-4, L4-5  . ANTERIOR LUMBAR FUSION N/A 02/24/2017   Procedure: Stage 1: Lumbar five-Sacral one Anterior lumbar interbody fusion;  Surgeon: Ditty, Kevan Ny, MD;  Location: [place];  Service: Neurosurgery;  Laterality: N/A;  Stage 1: L5-S1 Anterior lumbar interbody fusion  . APPENDECTOMY    . APPLICATION OF ROBOTIC ASSISTANCE FOR SPINAL PROCEDURE N/A 02/26/2017   Procedure: APPLICATION OF ROBOTIC ASSISTANCE FOR SPINAL PROCEDURE;  Surgeon: Ditty, Kevan Ny, MD;  Location: [place];  Service: Neurosurgery;  Laterality: N/A;  . APPLICATION OF WOUND VAC N/A 03/30/2017   Procedure: APPLICATION OF WOUND VAC;  Surgeon: Ditty, Kevan Ny, MD;  Location: [place];  Service: Neurosurgery;  Laterality: N/A;  . BACK SURGERY  2013  . BREAST SURGERY  1992   Breast Reduction  . CHOLECYSTECTOMY  1982  . COLON SURGERY  01/26/2014   desc.sigmoid colectomy and ventral hernia repair and splenic flexure mobilization  . COLON SURGERY    . EYE SURGERY Right 2017   cataracts  . [place]  . LUMBAR FUSION  02/24/2017  . LUMBAR LAMINECTOMY WITH SPINOUS PROCESS PLATE 2 LEVEL N/A 8/50/2774   Procedure: LUMBAR  LAMINECTOMY/DECOMPRESSION MICRODISCECTOMY CoFlex;  Surgeon: Faythe Ghee, MD;  Location: [facility];  Service: Neurosurgery;  Laterality: N/A;  Lumbar three-four,Lumbar Four-Five Laminectomy with Coflex  . LUMBAR WOUND DEBRIDEMENT N/A 03/30/2017   Procedure: Lumbar wound exploration/debridement, placement of wound vac;  Surgeon: Ditty, Kevan Ny, MD;  Location: [place];  Service: Neurosurgery;  Laterality: N/A;  Lumbar wound exploration/debridement, placement of wound vac  . PARTIAL COLECTOMY     01/27/14 diverticulitis and 3 benign lymph nodes [facility] Dr. Pat Patrick   . VENTRAL HERNIA REPAIR      Prior to Admission medications   Medication Sig Start Date End Date Taking? Authorizing Provider  acetaminophen (TYLENOL) 500 MG tablet Take 2 tablets (1,000 mg total) by mouth 3 (three) times daily. 04/04/17   Everrett Coombe, MD  atorvastatin (LIPITOR) 40 MG tablet Take 1 tablet (40 mg total) by mouth every evening. 02/11/18   McLean-Scocuzza, Nino Glow, MD  blood glucose meter kit and supplies KIT Dispense  based on patient and insurance preference. Use up to four times daily as directed. (FOR ICD-9 250.00, 250.01). 08/31/17   McLean-Scocuzza, Nino Glow, MD  Cholecalciferol 1000 units capsule Take 1 capsule (1,000 Units total) by mouth daily. 08/06/17   McLean-Scocuzza, Nino Glow, MD  cloNIDine (CATAPRES) 0.1 MG tablet Take 1 tablet (0.1 mg total) by mouth 2 (two) times daily. 01/29/18   McLean-Scocuzza, Nino Glow, MD  diclofenac sodium (VOLTAREN) 1 % GEL Apply 4 g topically 4 (four) times daily. Knees b/l 01/05/18   McLean-Scocuzza, Nino Glow, MD  doxycycline (VIBRA-TABS) 100 MG tablet Take 1 tablet (100 mg total) by mouth 2 (two) times daily. With food 04/19/18   McLean-Scocuzza, Nino Glow, MD  ferrous sulfate 325 (65 FE) MG tablet Take 1 tablet (325 mg total) by mouth daily with lunch. 08/06/17   McLean-Scocuzza, Nino Glow, MD  furosemide (LASIX) 40 MG tablet Take 1 tablet (40 mg total) by mouth daily. In am 01/25/18    McLean-Scocuzza, Nino Glow, MD  gabapentin (NEURONTIN) 300 MG capsule Take 1 capsule (300 mg total) by mouth at bedtime. 01/05/18   McLean-Scocuzza, Nino Glow, MD  labetalol (NORMODYNE) 100 MG tablet Take 100 mg by mouth 2 (two) times daily.  02/17/18 02/17/19  [provider]  levothyroxine (SYNTHROID, LEVOTHROID) 112 MCG tablet Take 1 tablet (112 mcg total) by mouth daily before breakfast. 04/28/18   McLean-Scocuzza, Nino Glow, MD  losartan (COZAAR) 100 MG tablet Take 1 tablet (100 mg total) by mouth daily after breakfast. 02/11/18   McLean-Scocuzza, Nino Glow, MD  Magnesium Oxide 400 MG CAPS Take 1 capsule (400 mg total) by mouth daily. 02/08/18   McLean-Scocuzza, Nino Glow, MD  vitamin C (ASCORBIC ACID) 500 MG tablet Take 500 mg by mouth. Take one tablet twice daily to aid wound healing    [provider]    Allergies Clams [shellfish allergy]; Hydralazine; Norvasc [amlodipine besylate]; and Milk-related compounds  Family History  Problem Relation Age of Onset  . Diabetes Father   . Heart attack Father   . Hypertension Father   . Heart disease Father   . Kidney disease Father   . Diabetes Mother   . Hypertension Mother   . Heart disease Mother   . Kidney disease Mother   . Lupus Sister   . Heart disease Brother   . Diabetes Brother   . Heart disease Brother   . Diabetes Brother   . Lupus Sister     Social History Social History   Tobacco Use  . Smoking status: Never Smoker  . Smokeless tobacco: Never Used  Substance Use Topics  . Alcohol use: No  . Drug use: No    Review of Systems Constitutional: No fever/chills Eyes: No visual changes. ENT: No sore throat. Cardiovascular: Denies chest pain. Respiratory: Positive for shortness of breath. Gastrointestinal: Positive for abdominal pain.  No nausea, no vomiting.  No diarrhea.  No constipation. Genitourinary: Negative for dysuria. Musculoskeletal: Positive for back pain. Skin: Negative for rash. Neurological:  Negative for headaches, focal weakness or numbness.   ____________________________________________   PHYSICAL EXAM:  VITAL SIGNS: ED Triage Vitals  Enc Vitals Group     BP 06/03/18 2357 (!) 218/63     Pulse Rate 06/03/18 2357 75     Resp 06/03/18 2357 20     Temp 06/03/18 2357 97.9 F (36.6 C)     Temp Source 06/03/18 2357 Oral     SpO2 06/03/18 2357 98 %  Weight 06/03/18 2353 210 lb (95.3 kg)     Height 06/03/18 2353 _0  (1.575 m)     Head Circumference --      Peak Flow --      Pain Score 06/03/18 2352 9     Pain Loc --      Pain Edu? --      Excl. in [place]? --     Constitutional: Alert and oriented x4 appears obviously uncomfortable although nontoxic no diaphoresis and speaks in full clear sentences Eyes: PERRL EOMI. Head: Atraumatic. Nose: No congestion/rhinnorhea. Mouth/Throat: No trismus Neck: No stridor.  Able to lie completely flat although with quite large JVD Cardiovascular: Normal rate, regular rhythm. Grossly normal heart sounds.  Good peripheral circulation. Respiratory: Increased respiratory effort although no accessory muscle use.  Crackles at bilateral bases Gastrointestinal: Obese soft nontender Musculoskeletal: Anasarca up to her lower abdomen.  3+ pitting edema.  Legs equal in size Neurologic:  Normal speech and language. No gross focal neurologic deficits are appreciated. Skin:  Skin is warm, dry and intact. No rash noted. Psychiatric: Mood and affect are normal. Speech and behavior are normal.    ____________________________________________   DIFFERENTIAL includes but not limited to  Hypertensive emergency, seroma, CSF leak, acute kidney injury ____________________________________________   LABS (all labs ordered are listed, but only abnormal results are displayed)  Labs Reviewed  COMPREHENSIVE METABOLIC PANEL - Abnormal; Notable for the following components:      Result Value   Glucose, Bld 114 (*)    BUN 58 (*)    Creatinine, Ser  2.45 (*)    Calcium 8.7 (*)    Albumin 2.3 (*)    GFR calc non Af Amer 18 (*)    GFR calc Af Amer 21 (*)    All other components within normal limits  BRAIN NATRIURETIC PEPTIDE - Abnormal; Notable for the following components:   B Natriuretic Peptide 1,059.0 (*)    All other components within normal limits  TROPONIN I - Abnormal; Notable for the following components:   Troponin I 0.03 (*)    All other components within normal limits  CBC WITH DIFFERENTIAL/PLATELET - Abnormal; Notable for the following components:   WBC 17.7 (*)    RBC 2.93 (*)    Hemoglobin 7.7 (*)    HCT 24.8 (*)    RDW 15.9 (*)    Platelets 456 (*)    Neutro Abs 13.4 (*)    All other components within normal limits  URINALYSIS, COMPLETE (UACMP) WITH MICROSCOPIC    Lab work reviewed by me shows acute kidney injury along with a drop in her hemoglobin increase in her beta natruretic peptide and troponin all consistent with hypertensive emergency __________________________________________  EKG  ED ECG REPORT I, Darel Hong, the attending physician, personally viewed and interpreted this ECG.  Date: 06/04/2018 EKG Time:  Rate: 63 Rhythm: normal sinus rhythm QRS Axis: normal Intervals: normal ST/T Wave abnormalities: normal Narrative Interpretation: no evidence of acute ischemia  ____________________________________________  RADIOLOGY  Chest x-ray reviewed by me shows slight amount of vascular congestion Lumbar spine x-ray reviewed by me with no acute disease ____________________________________________   PROCEDURES  Procedure(s) performed: no  .Critical Care Performed by: Darel Hong, MD Authorized by: Darel Hong, MD   Critical care provider statement:    Critical care time (minutes):  30   Critical care time was exclusive of:  Separately billable procedures and treating other patients   Critical care was necessary to treat or prevent  imminent or life-threatening deterioration of  the following conditions:  Renal failure   Critical care was time spent personally by me on the following activities:  Development of treatment plan with patient or surrogate, discussions with consultants, evaluation of patient's response to treatment, examination of patient, obtaining history from patient or surrogate, ordering and performing treatments and interventions, ordering and review of laboratory studies, ordering and review of radiographic studies, pulse oximetry, re-evaluation of patient's condition and review of old charts    Critical Care performed: Yes  ____________________________________________   INITIAL IMPRESSION / ASSESSMENT AND PLAN / ED COURSE  Pertinent labs & imaging results that were available during my care of the patient were reviewed by me and considered in my medical decision making (see chart for details).   As part of my medical decision making, I reviewed the following data within the [place] History obtained from family if available, nursing notes, old chart and ekg, as well as notes from prior ED visits.  The patient comes to the emergency department with multiple issues.  She does obviously have some seepage from her lumbar spine as when she rolls over on the bed the bed is wet.  In reviewing her previous MRI from 2 months ago she had a postoperative seroma and is unclear if this is seroma versus CSF leak.  She does have no neuro deficits which suggest against acute neuro injury.  More concerning is her blood pressure of 252/94.  I have a high clinical suspicion for hypertensive emergency so lab work is pending however I will add on a chest x-ray and give her 20 mg of IV labetalol now.  Chest x-ray shows pulmonary edema.  Labetalol brought her blood pressure down from 2 52-20 so we will re-dose.  Lab work is back showing acute kidney injury as well as elevated BNP and troponin.  At this point she requires inpatient admission for nicardipine  infusion and management of her multiple comorbidities.  The patient verbalizes understanding and agreement with the plan.  I spoke with the hospitalist who has graciously agreed to admit the patient to his service.      ____________________________________________   FINAL CLINICAL IMPRESSION(S) / ED DIAGNOSES  Final diagnoses:  Hypertensive emergency  Anasarca  Acute kidney injury ([place])  Acute pulmonary edema ([place])  Hypoalbuminemia      NEW MEDICATIONS STARTED DURING THIS VISIT:  New Prescriptions   No medications on file     Note:  This document was prepared using Dragon voice recognition software and may include unintentional dictation errors.     Darel Hong, MD 06/04/18 [id number]    Darel Hong, MD 06/15/18 2007

## 2018-06-04 NOTE — Progress Notes (Signed)
Dressing to lower back completed.  When changing dressing pressure applied to left buttock to assist patient to roll over copious amounts of purulent drainage drained from wound. Patient left buttock warm to touch and painful.  Wound packed with recommended silicone dressing and cover with foam.  MRI to be completed to assess wound further.  Patient refused pain medication prior to dressing changed but accepted morphine post dressing change.  Nurse will continue to monitor patient. Marda Stalker, NP notified of wound drainage.

## 2018-06-04 NOTE — ED Notes (Addendum)
Pt c/o bilateral lower extremity edema for "weeks" states that she takes Lasix. C/o left upper leg pain and states that she feels more swollen on that side. Clear fluid leaking from surgical site on lower back.

## 2018-06-04 NOTE — Consult Note (Signed)
NAME: Taylor Tyler  DOB: 10/07/1940  MRN: [id number]  Date/Time: 06/04/2018 6:25 PM Subjective:  REASON FOR CONSULT:  ? Taylor Tyler is a 77 y.o. female with a history of HTN, CKD, multiple lumbar surgery with hardware, proteus infection of the surgical site in 2018 presents from home with severe pain left hip/buttock area and malaise. As per patient she had fever a few days ago and thought it was flu. For sometime she has had discharge from lower back which she mistook for diarrhea.. For couple of days she has had severe pain left hip and low back and buttock and she drove herself to the ED last night. In the ED she had a BP of 218/63 Temp 100.4, WBC 17.7 A draining sinus was noted in the sacrum area and cultures were sent and she was started on vanco/cefepime along with medication for HTN and she was admitted to ICU for nicardipine drip.  Medical history  In July 2014 she had  Bilateral L3-4 and L4-5 decompressive laminectomy with L3-4 and L4-5 Coflex posterior instrumentation  In July 2018 she underwent revision She had  removal of L3-4 and L4-5 Coflex devices, re-operative bilateral L3-4 and L4-5 laminectomies with L4-5 Gill procedure, L5-S1 initial laminectomy with facetectomy, posterior segmental instrumented fusion L3-S1  In Aug there was wound dehiscence and on 03/30/17 she underwent Lumbar wound exploration/debridement, placement of wound vac (N/A) - Lumbar wound exploration/debridement, placement of wound vac by Dr.Ditty Proteus in wound culture. initally treated with IV ceftriaxone and then on discharge changed to PO levaquin for 6 weeks- Was followed by Taylor Tyler. Last saw him on 07/02/17. Finally the wound vac was removed feb 2019.  Saw her PCP in Sept 2019 and the  note mentions positive ANA and SSA, SSB.   Past Medical History:  Diagnosis Date  . Acute blood loss anemia 03/14/2017  . Acute renal failure superimposed on stage 3 chronic kidney disease ([place]) 03/14/2017  . AKI  (acute kidney injury) ([place]) 02/28/2017  . Altered mental status 03/29/2017  . Anemia   . Aortic atherosclerosis ([place])   . Arthritis   . Bilateral lower extremity edema 03/04/2017  . Bradycardia   . Cataract   . Chronic kidney disease    ?? renal insufficiency,   . CKD (chronic kidney disease) stage 3, GFR 30-59 ml/min ([facility]) 01/15/2017   ?? renal insufficiency, which she thinks is coming from "all these medications"  . Diabetes mellitus without complication ([place])    diagnosed 4-5 yrs ago  . Disease of pancreas   . Diverticulitis    s/p perforation and partial colectomy 01/27/14 with 3 benign lymph nodes   . Diverticulosis 01/15/2017  . DJD (degenerative joint disease)   . DM (diabetes mellitus), type 2 with renal complications ([place]) 7/42/5956  . Fatty liver   . Hypertension   . Hypothyroidism   . Hypothyroidism    ? etiology autoimmune or acquired   . Kidney stone   . Lethargy 02/28/2017  . Obesity, Class III, BMI 40-49.9 (morbid obesity) ([place]) 02/27/2017  . Pleural lipoma   . Postoperative wound infection 04/02/2017  . Spinal stenosis of lumbar region 01/15/2017  . Status post lumbar surgery 02/24/2017  . Ventral hernia   . Wound healing, delayed    back    Past Surgical History:  Procedure Laterality Date  . ABDOMINAL EXPOSURE N/A 02/24/2017   Procedure: ABDOMINAL EXPOSURE;  Surgeon: Angelia Mould, MD;  Location: [facility];  Service: Vascular;  Laterality: N/A;  .  ABDOMINAL HYSTERECTOMY  1987   no h/o abnormal paps   . ANTERIOR LAT LUMBAR FUSION N/A 02/24/2017   Procedure: Lumbar three- five Anterior lateral lumbar interbody fusion;  Surgeon: Ditty, Kevan Ny, MD;  Location: [facility];  Service: Neurosurgery;  Laterality: N/A;  L3-5 Anterior lateral lumbar interbody fusion with removal of coflex at L3-4, L4-5  . ANTERIOR LUMBAR FUSION N/A 02/24/2017   Procedure: Stage 1: Lumbar five-Sacral one Anterior lumbar interbody fusion;  Surgeon: Ditty, Kevan Ny, MD;  Location: [facility];  Service: Neurosurgery;  Laterality: N/A;  Stage 1: L5-S1 Anterior lumbar interbody fusion  . APPENDECTOMY    . APPLICATION OF ROBOTIC ASSISTANCE FOR SPINAL PROCEDURE N/A 02/26/2017   Procedure: APPLICATION OF ROBOTIC ASSISTANCE FOR SPINAL PROCEDURE;  Surgeon: Ditty, Kevan Ny, MD;  Location: [facility];  Service: Neurosurgery;  Laterality: N/A;  . APPLICATION OF WOUND VAC N/A 03/30/2017   Procedure: APPLICATION OF WOUND VAC;  Surgeon: Ditty, Kevan Ny, MD;  Location: [facility];  Service: Neurosurgery;  Laterality: N/A;  . BACK SURGERY  2013  . BREAST SURGERY  1992   Breast Reduction  . CHOLECYSTECTOMY  1982  . COLON SURGERY  01/26/2014   desc.sigmoid colectomy and ventral hernia repair and splenic flexure mobilization  . COLON SURGERY    . EYE SURGERY Right 2017   cataracts  . [place]  . LUMBAR FUSION  02/24/2017  . LUMBAR LAMINECTOMY WITH SPINOUS PROCESS PLATE 2 LEVEL N/A 2/45/8099   Procedure: LUMBAR LAMINECTOMY/DECOMPRESSION MICRODISCECTOMY CoFlex;  Surgeon: Faythe Ghee, MD;  Location: [facility];  Service: Neurosurgery;  Laterality: N/A;  Lumbar three-four,Lumbar Four-Five Laminectomy with Coflex  . LUMBAR WOUND DEBRIDEMENT N/A 03/30/2017   Procedure: Lumbar wound exploration/debridement, placement of wound vac;  Surgeon: Ditty, Kevan Ny, MD;  Location: [facility];  Service: Neurosurgery;  Laterality: N/A;  Lumbar wound exploration/debridement, placement of wound vac  . PARTIAL COLECTOMY     01/27/14 diverticulitis and 3 benign lymph nodes [facility] Dr. Pat Patrick   . VENTRAL HERNIA REPAIR      SH Lives on her own Non smoker   Family History  Problem Relation Age of Onset  . Diabetes Father   . Heart attack Father   . Hypertension Father   . Heart disease Father   . Kidney disease Father   . Diabetes Mother   . Hypertension Mother   . Heart disease Mother   . Kidney disease Mother   . Lupus Sister   . Heart disease Brother   . Diabetes Brother   . Heart  disease Brother   . Diabetes Brother   . Lupus Sister    Allergies  Allergen Reactions  . Clams [Shellfish Allergy] Swelling and Other (See Comments)    THROAT SWELLS NECK TURNS RED  . Hydralazine Other (See Comments)    CHEST TIGHTNESS  . Norvasc [Amlodipine Besylate] Swelling    Leg edema   . Milk-Related Compounds Diarrhea   ? Current Facility-Administered Medications  Medication Dose Route Frequency Provider Last Rate Last Dose  . acetaminophen (TYLENOL) tablet 650 mg  650 mg Oral Q6H PRN Harrie Foreman, MD       Or  . acetaminophen (TYLENOL) suppository 650 mg  650 mg Rectal Q6H PRN Harrie Foreman, MD      . ceFEPIme (MAXIPIME) 1 g in sodium chloride 0.9 % 100 mL IVPB  1 g Intravenous Q24H Paticia Stack, [facility]   Stopped at 06/04/18 1235  .  cloNIDine (CATAPRES) tablet 0.1 mg  0.1 mg Oral BID Harrie Foreman, MD   0.1 mg at 06/04/18 0905  . docusate sodium (COLACE) capsule 100 mg  100 mg Oral BID Harrie Foreman, MD      . Derrill Memo ON 06/05/2018] Influenza vac split quadrivalent PF (FLUZONE HIGH-DOSE) injection 0.5 mL  0.5 mL Intramuscular Tomorrow-1000 Harrie Foreman, MD      . iron sucrose (VENOFER) 200 mg in sodium chloride 0.9 % 150 mL IVPB  200 mg Intravenous Q24H Gladstone Lighter, MD 160 mL/hr at 06/04/18 1641 200 mg at 06/04/18 1641  . isosorbide mononitrate (IMDUR) 24 hr tablet 15 mg  15 mg Oral Daily Samaan, Maged, MD   15 mg at 06/04/18 1731  . labetalol (NORMODYNE) tablet 100 mg  100 mg Oral BID Harrie Foreman, MD   100 mg at 06/04/18 0905  . labetalol (NORMODYNE,TRANDATE) injection 10 mg  10 mg Intravenous Q4H PRN Cassandria Santee, MD   10 mg at 06/04/18 1732  . levothyroxine (SYNTHROID, LEVOTHROID) tablet 112 mcg  112 mcg Oral QAC breakfast Harrie Foreman, MD   112 mcg at 06/04/18 0905  . losartan (COZAAR) tablet 100 mg  100 mg Oral Daily Samaan, Maged, MD   100 mg at 06/04/18 1204  . morphine 2 MG/ML injection 1-2 mg  1-2 mg Intravenous Q4H PRN  Darel Hong D, NP   1 mg at 06/04/18 1544  . multivitamin-lutein (OCUVITE-LUTEIN) capsule 1 capsule  1 capsule Oral Daily Cassandria Santee, MD   1 capsule at 06/04/18 1525  . ondansetron (ZOFRAN) tablet 4 mg  4 mg Oral Q6H PRN Harrie Foreman, MD       Or  . ondansetron [facility]) injection 4 mg  4 mg Intravenous Q6H PRN Harrie Foreman, MD      . oxyCODONE-acetaminophen (PERCOCET/ROXICET) 5-325 MG per tablet 1 tablet  1 tablet Oral Q4H PRN Bradly Bienenstock, NP   1 tablet at 06/04/18 [phone]  . protein supplement (PREMIER PROTEIN) liquid - approved for s/p bariatric surgery  11 oz Oral BID BM Samaan, Maged, MD   11 oz at 06/04/18 1526  . vancomycin (VANCOCIN) IVPB 1000 mg/200 mL premix  1,000 mg Intravenous Q36H Darel Hong D, NP 200 mL/hr at 06/04/18 1752 1,000 mg at 06/04/18 1752  . vitamin C (ASCORBIC ACID) tablet 250 mg  250 mg Oral BID Cassandria Santee, MD   250 mg at 06/04/18 1526     Abtx:  Anti-infectives (From admission, onward)   Start     Dose/Rate Route Frequency Ordered Stop   06/04/18 1800  vancomycin (VANCOCIN) IVPB 1000 mg/200 mL premix     1,000 mg 200 mL/hr over 60 Minutes Intravenous Every 36 hours 06/04/18 0633     06/04/18 0930  ceFEPIme (MAXIPIME) 1 g in sodium chloride 0.9 % 100 mL IVPB     1 g 200 mL/hr over 30 Minutes Intravenous Every 24 hours 06/04/18 0852     06/04/18 0645  vancomycin (VANCOCIN) IVPB 1000 mg/200 mL premix     1,000 mg 200 mL/hr over 60 Minutes Intravenous  Once 06/04/18 6213 06/04/18 1010      REVIEW OF SYSTEMS:  Const: subjective fever, negative chills, negative weight loss Eyes: negative diplopia or visual changes, negative eye pain ENT: negative coryza, negative sore throat Resp: negative cough, hemoptysis, dyspnea Cards: negative for chest pain, palpitations, lower extremity edema GU: negative for frequency, dysuria and hematuria GI: Negative for abdominal pain diarrhea,  bleeding, constipation Skin: negative for rash and  pruritus Heme: negative for easy bruising and gum/nose bleeding MS: + myalgias, arthralgias, back pain and muscle weakness Neurolo:negative for headaches, dizziness, vertigo, memory problems  Psych: negative for feelings of anxiety, depression  Endocrine: no polyuria  Allergy/Immunology-allergic to milk related compounds         Objective:  VITALS:  BP (!) 190/61   Pulse 87   Temp 100.1 F (37.8 C) (Oral)   Resp 18   Ht 5\' 2"  (1.575 m)   Wt 95.3 kg   SpO2 96%   BMI 38.41 kg/m  PHYSICAL EXAM:  General: Alert, cooperative, no distress, appears stated age.  Head: Normocephalic, without obvious abnormality, atraumatic. Eyes: Conjunctivae clear, anicteric sclerae. Pupils are equal ENT Nares normal. No drainage or sinus tenderness. Lips, mucosa, and tongue normal. No Thrush Neck: Supple, symmetrical, no adenopathy, thyroid: non tender no carotid bruit and no JVD. Back: draining wound sacrum ( more like a sinus) Lungs: Clear to auscultation bilaterally. No Wheezing or Rhonchi. No rales. Heart: Regular rate and rhythm, no murmur, rub or gallop. Abdomen: Soft, non-tender,not distended. Bowel sounds normal. No masses Extremities: atraumatic, no cyanosis. + edema. No clubbing Skin: No rashes or lesions. Or bruising Lymph: Cervical, supraclavicular normal. Neurologic: Grossly non-focal Pertinent Labs Lab Results CBC    Component Value Date/Time   WBC 18.8 (H) 06/04/2018 1315   RBC 2.62 (L) 06/04/2018 1315   HGB 6.8 (L) 06/04/2018 1315   HGB 9.6 (L) 04/22/2014 0210   HCT 21.3 (L) 06/04/2018 1315   HCT 29.7 (L) 04/22/2014 0210   PLT 355 06/04/2018 1315   PLT 230 04/22/2014 0210   MCV 81.3 06/04/2018 1315   MCV 90 04/22/2014 0210   MCH 26.0 06/04/2018 1315   MCHC 31.9 06/04/2018 1315   RDW 15.9 (H) 06/04/2018 1315   RDW 17.8 (H) 04/22/2014 0210   LYMPHSABS 1.2 06/04/2018 0245   LYMPHSABS 2.0 04/22/2014 0210   MONOABS 0.6 06/04/2018 0245   MONOABS 0.6 04/22/2014 0210    EOSABS 0.1 06/04/2018 0245   EOSABS 0.0 04/22/2014 0210   BASOSABS 0.0 06/04/2018 0245   BASOSABS 0.0 04/22/2014 0210    CMP Latest Ref Rng & Units 06/04/2018 06/04/2018 04/19/2018  Glucose 70 - 99 mg/dL 199(H) 114(H) 101(H)  BUN 8 - 23 mg/dL 51(H) 58(H) 27(H)  Creatinine 0.44 - 1.00 mg/dL 2.36(H) 2.45(H) 1.69(H)  Sodium 135 - 145 mmol/L 137 138 136  Potassium 3.5 - 5.1 mmol/L 4.3 3.9 3.5  Chloride 98 - 111 mmol/L 104 104 101  CO2 22 - 32 mmol/L 24 25 30   Calcium 8.9 - 10.3 mg/dL 8.3(L) 8.7(L) 8.6  Total Protein 6.5 - 8.1 g/dL - 7.3 -  Total Bilirubin 0.3 - 1.2 mg/dL - 0.6 -  Alkaline Phos 38 - 126 U/L - 75 -  AST 15 - 41 U/L - 17 -  ALT 0 - 44 U/L - 12 -      Microbiology: Recent Results (from the past 240 hour(s))  Aerobic/Anaerobic Culture (surgical/deep wound)     Status: None (Preliminary result)   Collection Time: 06/04/18  6:11 AM  Result Value Ref Range Status   Specimen Description   Final    BACK Performed at [facility], [address], [place], [place] [address]    Special Requests   Final    Normal Performed at [facility], [address] [place], [place] [address]    Gram Stain   Final  RARE WBC PRESENT, PREDOMINANTLY PMN RARE GRAM POSITIVE RODS RARE GRAM POSITIVE COCCI Performed at [facility], [place] [address] [place], [place] [address]    Culture PENDING  Incomplete   Report Status PENDING  Incomplete  MRSA PCR Screening     Status: None   Collection Time: 06/04/18  6:13 AM  Result Value Ref Range Status   MRSA by PCR NEGATIVE NEGATIVE Final    Comment:        The GeneXpert MRSA Assay (FDA approved for NASAL specimens only), is one component of a comprehensive MRSA colonization surveillance program. It is not intended to diagnose MRSA infection nor to guide or monitor treatment for MRSA infections. Performed at [facility], [address], [place], [place] [address]    IMAGING  RESULTS: ?     Impression/Recommendation ?77 y.o. female with a history of HTN, CKD, multiple lumbar surgery with hardware, proteus infection of the surgical site in 2018 presents from home with severe pain left hip/buttock area and malaise. As per patient she had fever a few days ago and thought it was flu. For sometime she has had discharge from lower back which she mistook for diarrhea.. For couple of days she has had severe pain left hip and low back and buttock and she drove herself to the ED last night. ? Sacral sinus with purulent discharge with left gluteal swelling- Xray show air in the gluteal area. Concern for spinal hardware infection and deep seated infection. . On vanco and cefepime-will add flagyl Seen by surgery.MRI pending. Will need surgical drainage  Malignant HTN- on labetolol, isosorbide, clonidine  H/o lumbar surgery with hardware. Last year has surgical wound dehiscence and proteus in the wound and treated for 6 weeks with antibiotic- levaquin  AKI on CKD  Positive ANA  ? ___________________________________________________ Discussed with patient,and her son

## 2018-06-04 NOTE — Consult Note (Signed)
[facility]  CONSULT NOTE    Date: 06/04/2018                  Patient Name:  Taylor Tyler  MRN: [id number]  DOB: 1940-12-21  Age / Sex: 77 y.o., female         PCP: McLean-Scocuzza, Nino Glow, MD                 Service Requesting Consult: Dr. Soyla Murphy                 Reason for Consult: Acute renal failure            History of Present Illness: [name] is a 77 y.o. black female with hypertension, diabetes mellitus type II, hyperlipidemia, coronary artery disease, who was admitted to [facility] on 06/04/2018 for Anasarca [R60.1] Acute pulmonary edema ([place]) [J81.0] Hypoalbuminemia [E88.09] Acute renal failure (ARF) ([place]) [N17.9] Acute kidney injury ([place]) [N17.9] Hypertensive emergency [I16.1]  Son at bedside.  Patient has seen [facility] in past.   Medications: Outpatient medications: Medications Prior to Admission  Medication Sig Dispense Refill Last Dose  . acetaminophen (TYLENOL) 500 MG tablet Take 2 tablets (1,000 mg total) by mouth 3 (three) times daily. 30 tablet 0 prn at prn  . Cholecalciferol 1000 units capsule Take 1 capsule (1,000 Units total) by mouth daily. 90 capsule 3 06/03/2018 at Unknown time  . cloNIDine (CATAPRES) 0.1 MG tablet Take 1 tablet (0.1 mg total) by mouth 2 (two) times daily. 180 tablet 0 06/03/2018 at Unknown time  . diclofenac sodium (VOLTAREN) 1 % GEL Apply 4 g topically 4 (four) times daily. Knees b/l (Patient taking differently: Apply 4 g topically 4 (four) times daily as needed (pain). Knees b/l) 100 g 11 prn at prn  . ferrous sulfate 325 (65 FE) MG tablet Take 1 tablet (325 mg total) by mouth daily with lunch. 90 tablet 3 06/03/2018 at Unknown time  . furosemide (LASIX) 40 MG tablet Take 1 tablet (40 mg total) by mouth daily. In am 90 tablet 1 06/03/2018 at Unknown time  . gabapentin (NEURONTIN) 300 MG capsule Take 1 capsule (300 mg total) by mouth at bedtime. (Patient taking differently: Take 300 mg by mouth at  bedtime as needed (pain). ) 90 capsule 1 prn at prn  . labetalol (NORMODYNE) 100 MG tablet Take 100 mg by mouth 2 (two) times daily.    06/03/2018 at Unknown time  . levothyroxine (SYNTHROID, LEVOTHROID) 112 MCG tablet Take 1 tablet (112 mcg total) by mouth daily before breakfast. 90 tablet 3 06/03/2018 at Unknown time  . losartan (COZAAR) 100 MG tablet Take 1 tablet (100 mg total) by mouth daily after breakfast. 90 tablet 1 06/03/2018 at Unknown time  . Magnesium Oxide 400 MG CAPS Take 1 capsule (400 mg total) by mouth daily. 90 capsule 3 06/03/2018 at Unknown time  . vitamin C (ASCORBIC ACID) 500 MG tablet Take 500 mg by mouth daily.    06/03/2018 at Unknown time  . atorvastatin (LIPITOR) 40 MG tablet Take 1 tablet (40 mg total) by mouth every evening. (Patient not taking: Reported on 06/04/2018) 90 tablet 1 Not Taking at Unknown time  . blood glucose meter kit and supplies KIT Dispense based on patient and insurance preference. Use up to four times daily as directed. (FOR ICD-9 250.00, 250.01). 1 each 0 Taking  . doxycycline (VIBRA-TABS) 100 MG tablet Take 1 tablet (100 mg total) by mouth 2 (two)  times daily. With food (Patient not taking: Reported on 06/04/2018) 14 tablet 0 Completed Course at Unknown time    Current medications: Current Facility-Administered Medications  Medication Dose Route Frequency Provider Last Rate Last Dose  . acetaminophen (TYLENOL) tablet 650 mg  650 mg Oral Q6H PRN Harrie Foreman, MD       Or  . acetaminophen (TYLENOL) suppository 650 mg  650 mg Rectal Q6H PRN Harrie Foreman, MD      . ceFEPIme (MAXIPIME) 1 g in sodium chloride 0.9 % 100 mL IVPB  1 g Intravenous Q24H Paticia Stack, [facility]   Stopped at 06/04/18 1235  . cloNIDine (CATAPRES) tablet 0.1 mg  0.1 mg Oral BID Harrie Foreman, MD   0.1 mg at 06/04/18 0905  . docusate sodium (COLACE) capsule 100 mg  100 mg Oral BID Harrie Foreman, MD      . Derrill Memo ON 06/05/2018] Influenza vac split quadrivalent  PF (FLUZONE HIGH-DOSE) injection 0.5 mL  0.5 mL Intramuscular Tomorrow-1000 Harrie Foreman, MD      . iron sucrose (VENOFER) 200 mg in sodium chloride 0.9 % 150 mL IVPB  200 mg Intravenous Q24H Gladstone Lighter, MD 160 mL/hr at 06/04/18 1641 200 mg at 06/04/18 1641  . isosorbide mononitrate (IMDUR) 24 hr tablet 15 mg  15 mg Oral Daily Samaan, Maged, MD   15 mg at 06/04/18 1731  . labetalol (NORMODYNE) tablet 100 mg  100 mg Oral BID Harrie Foreman, MD   100 mg at 06/04/18 0905  . labetalol (NORMODYNE,TRANDATE) injection 10 mg  10 mg Intravenous Q4H PRN Cassandria Santee, MD   10 mg at 06/04/18 1732  . levothyroxine (SYNTHROID, LEVOTHROID) tablet 112 mcg  112 mcg Oral QAC breakfast Harrie Foreman, MD   112 mcg at 06/04/18 0905  . losartan (COZAAR) tablet 100 mg  100 mg Oral Daily Samaan, Maged, MD   100 mg at 06/04/18 1204  . morphine 2 MG/ML injection 1-2 mg  1-2 mg Intravenous Q4H PRN Darel Hong D, NP   1 mg at 06/04/18 1544  . multivitamin-lutein (OCUVITE-LUTEIN) capsule 1 capsule  1 capsule Oral Daily Cassandria Santee, MD   1 capsule at 06/04/18 1525  . ondansetron (ZOFRAN) tablet 4 mg  4 mg Oral Q6H PRN Harrie Foreman, MD       Or  . ondansetron [facility]) injection 4 mg  4 mg Intravenous Q6H PRN Harrie Foreman, MD      . oxyCODONE-acetaminophen (PERCOCET/ROXICET) 5-325 MG per tablet 1 tablet  1 tablet Oral Q4H PRN Bradly Bienenstock, NP   1 tablet at 06/04/18 [phone]  . protein supplement (PREMIER PROTEIN) liquid - approved for s/p bariatric surgery  11 oz Oral BID BM Samaan, Maged, MD   11 oz at 06/04/18 1526  . vancomycin (VANCOCIN) IVPB 1000 mg/200 mL premix  1,000 mg Intravenous Q36H Darel Hong D, NP      . vitamin C (ASCORBIC ACID) tablet 250 mg  250 mg Oral BID Cassandria Santee, MD   250 mg at 06/04/18 1526      Allergies: Allergies  Allergen Reactions  . Clams [Shellfish Allergy] Swelling and Other (See Comments)    THROAT SWELLS NECK TURNS RED  . Hydralazine Other  (See Comments)    CHEST TIGHTNESS  . Norvasc [Amlodipine Besylate] Swelling    Leg edema   . Milk-Related Compounds Diarrhea      Past Medical History: Past Medical History:  Diagnosis Date  .  Acute blood loss anemia 03/14/2017  . Acute renal failure superimposed on stage 3 chronic kidney disease ([place]) 03/14/2017  . AKI (acute kidney injury) ([place]) 02/28/2017  . Altered mental status 03/29/2017  . Anemia   . Aortic atherosclerosis ([place])   . Arthritis   . Bilateral lower extremity edema 03/04/2017  . Bradycardia   . Cataract   . Chronic kidney disease    ?? renal insufficiency,   . CKD (chronic kidney disease) stage 3, GFR 30-59 ml/min (HCC) 01/15/2017   ?? renal insufficiency, which she thinks is coming from "all these medications"  . Diabetes mellitus without complication ([place])    diagnosed 4-5 yrs ago  . Disease of pancreas   . Diverticulitis    s/p perforation and partial colectomy 01/27/14 with 3 benign lymph nodes   . Diverticulosis 01/15/2017  . DJD (degenerative joint disease)   . DM (diabetes mellitus), type 2 with renal complications ([place]) 5/73/2202  . Fatty liver   . Hypertension   . Hypothyroidism   . Hypothyroidism    ? etiology autoimmune or acquired   . Kidney stone   . Lethargy 02/28/2017  . Obesity, Class III, BMI 40-49.9 (morbid obesity) ([place]) 02/27/2017  . Pleural lipoma   . Postoperative wound infection 04/02/2017  . Spinal stenosis of lumbar region 01/15/2017  . Status post lumbar surgery 02/24/2017  . Ventral hernia   . Wound healing, delayed    back     Past Surgical History: Past Surgical History:  Procedure Laterality Date  . ABDOMINAL EXPOSURE N/A 02/24/2017   Procedure: ABDOMINAL EXPOSURE;  Surgeon: Angelia Mould, MD;  Location: [place];  Service: Vascular;  Laterality: N/A;  . ABDOMINAL HYSTERECTOMY  1987   no h/o abnormal paps   . ANTERIOR LAT LUMBAR FUSION N/A 02/24/2017   Procedure: Lumbar three- five Anterior lateral lumbar interbody  fusion;  Surgeon: Ditty, Kevan Ny, MD;  Location: [place];  Service: Neurosurgery;  Laterality: N/A;  L3-5 Anterior lateral lumbar interbody fusion with removal of coflex at L3-4, L4-5  . ANTERIOR LUMBAR FUSION N/A 02/24/2017   Procedure: Stage 1: Lumbar five-Sacral one Anterior lumbar interbody fusion;  Surgeon: Ditty, Kevan Ny, MD;  Location: [place];  Service: Neurosurgery;  Laterality: N/A;  Stage 1: L5-S1 Anterior lumbar interbody fusion  . APPENDECTOMY    . APPLICATION OF ROBOTIC ASSISTANCE FOR SPINAL PROCEDURE N/A 02/26/2017   Procedure: APPLICATION OF ROBOTIC ASSISTANCE FOR SPINAL PROCEDURE;  Surgeon: Ditty, Kevan Ny, MD;  Location: [place];  Service: Neurosurgery;  Laterality: N/A;  . APPLICATION OF WOUND VAC N/A 03/30/2017   Procedure: APPLICATION OF WOUND VAC;  Surgeon: Ditty, Kevan Ny, MD;  Location: [place];  Service: Neurosurgery;  Laterality: N/A;  . BACK SURGERY  2013  . BREAST SURGERY  1992   Breast Reduction  . CHOLECYSTECTOMY  1982  . COLON SURGERY  01/26/2014   desc.sigmoid colectomy and ventral hernia repair and splenic flexure mobilization  . COLON SURGERY    . EYE SURGERY Right 2017   cataracts  . [place]  . LUMBAR FUSION  02/24/2017  . LUMBAR LAMINECTOMY WITH SPINOUS PROCESS PLATE 2 LEVEL N/A 5/42/7062   Procedure: LUMBAR LAMINECTOMY/DECOMPRESSION MICRODISCECTOMY CoFlex;  Surgeon: Faythe Ghee, MD;  [facility]: [facility];  Service: Neurosurgery;  Laterality: N/A;  Lumbar three-four,Lumbar Four-Five Laminectomy with Coflex  . LUMBAR WOUND DEBRIDEMENT N/A 03/30/2017   Procedure: Lumbar wound exploration/debridement, placement of wound vac;  Surgeon: Ditty, Kevan Ny, MD;  Location: [place] [facility];  Service: Neurosurgery;  Laterality: N/A;  Lumbar wound exploration/debridement, placement of wound vac  . PARTIAL COLECTOMY     01/27/14 diverticulitis and 3 benign lymph nodes [facility] Dr. Pat Patrick   . VENTRAL HERNIA REPAIR       Family  History: Family History  Problem Relation Age of Onset  . Diabetes Father   . Heart attack Father   . Hypertension Father   . Heart disease Father   . Kidney disease Father   . Diabetes Mother   . Hypertension Mother   . Heart disease Mother   . Kidney disease Mother   . Lupus Sister   . Heart disease Brother   . Diabetes Brother   . Heart disease Brother   . Diabetes Brother   . Lupus Sister      Social History: Social History   Socioeconomic History  . Marital status: Widowed    Spouse name: Not on file  . Number of children: Not on file  . Years of education: Not on file  . Highest education level: Not on file  Occupational History  . Occupation: retired Economist work  Scientific laboratory technician  . Financial resource strain: Not hard at all  . Food insecurity:    Worry: Never true    Inability: Never true  . Transportation needs:    Medical: No    Non-medical: No  Tobacco Use  . Smoking status: Never Smoker  . Smokeless tobacco: Never Used  Substance and Sexual Activity  . Alcohol use: No  . Drug use: No  . Sexual activity: Never  Lifestyle  . Physical activity:    Days per week: 0 days    Minutes per session: Not on file  . Stress: Not on file  Relationships  . Social connections:    Talks on phone: Not on file    Gets together: Not on file    Attends religious service: Not on file    Active member of club or organization: Not on file    Attends meetings of clubs or organizations: Not on file    Relationship status: Not on file  . Intimate partner violence:    Fear of current or ex partner: Not on file    Emotionally abused: Not on file    Physically abused: Not on file    Forced sexual activity: Not on file  Other Topics Concern  . Not on file  Social History Narrative   Admitted to [facility] 03/03/17   Widowed since 2012    2 sons [name] comes to appts)    Never smoked   Alcohol none   Full Code   12th grade education retired    Publishing copy      Review of Systems: Review of Systems  Constitutional: Positive for chills, fever and malaise/fatigue. Negative for diaphoresis and weight loss.  HENT: Negative for congestion, ear discharge, ear pain, hearing loss, nosebleeds, sinus pain, sore throat and tinnitus.   Eyes: Negative.  Negative for blurred vision, double vision, photophobia, pain, discharge and redness.  Respiratory: Positive for shortness of breath. Negative for cough, hemoptysis, sputum production, wheezing and stridor.   Cardiovascular: Positive for orthopnea, claudication, leg swelling and PND. Negative for chest pain and palpitations.  Gastrointestinal: Negative.  Negative for abdominal pain, blood in stool, constipation, diarrhea, heartburn, melena, nausea and vomiting.  Genitourinary: Negative.  Negative for dysuria, flank pain, frequency, hematuria and urgency.  Musculoskeletal: Negative.  Negative  for back pain, falls, joint pain, myalgias and neck pain.  Skin: Negative.  Negative for itching and rash.  Neurological: Negative.  Negative for dizziness, tingling, tremors, sensory change, speech change, focal weakness, seizures, loss of consciousness, weakness and headaches.  Endo/Heme/Allergies: Negative for environmental allergies and polydipsia. Does not bruise/bleed easily.  Psychiatric/Behavioral: Negative.  Negative for depression, hallucinations, memory loss, substance abuse and suicidal ideas. The patient is not nervous/anxious and does not have insomnia.     Vital Signs: Blood pressure (!) 190/61, pulse 87, temperature 100.1 F (37.8 C), temperature source Oral, resp. rate 18, height _0  (1.575 m), weight 95.3 kg, SpO2 96 %.  Weight trends: Filed Weights   06/03/18 2353  Weight: 95.3 kg    Physical Exam: General: NAD,   Head: Normocephalic, atraumatic. Moist oral mucosal membranes  Eyes: Anicteric, PERRL  Neck: Supple, trachea midline  Lungs:  Clear to auscultation  Heart: Regular rate and  rhythm  Abdomen:  Soft, nontender,   Extremities:  ++ peripheral edema.  Neurologic: Nonfocal, moving all four extremities  Skin: No lesions   Back Tender with weaping wound     Lab results: Basic Metabolic Panel: Recent Labs  Lab 06/04/18 0245 06/04/18 1315  NA 138 137  K 3.9 4.3  CL 104 104  CO2 25 24  GLUCOSE 114* 199*  BUN 58* 51*  CREATININE 2.45* 2.36*  CALCIUM 8.7* 8.3*    Liver Function Tests: Recent Labs  Lab 06/04/18 0245  AST 17  ALT 12  ALKPHOS 75  BILITOT 0.6  PROT 7.3  ALBUMIN 2.3*   No results for input(s): LIPASE, AMYLASE in the last 168 hours. No results for input(s): AMMONIA in the last 168 hours.  CBC: Recent Labs  Lab 06/04/18 0245 06/04/18 1315  WBC 17.7* 18.8*  NEUTROABS 13.4*  --   HGB 7.7* 6.8*  HCT 24.8* 21.3*  MCV 84.6 81.3  PLT 456* 355    Cardiac Enzymes: Recent Labs  Lab 06/04/18 0245 06/04/18 0636 06/04/18 1315  TROPONINI 0.03* 0.03* 0.07*    BNP: Invalid input(s): POCBNP  CBG: Recent Labs  Lab 06/04/18 0543  GLUCAP 105*    Microbiology: Results for orders placed or performed during the hospital encounter of 06/04/18  Aerobic/Anaerobic Culture (surgical/deep wound)     Status: None (Preliminary result)   Collection Time: 06/04/18  6:11 AM  Result Value Ref Range Status   Specimen Description   Final    BACK Performed at [facility], [address], [place], [place] [address]    Special Requests   Final    Normal Performed at [facility], ., [place], [place] [address]    Gram Stain   Final    RARE WBC PRESENT, PREDOMINANTLY PMN RARE GRAM POSITIVE RODS RARE GRAM POSITIVE COCCI Performed at [name], [place] [address], [place],  [address]    Culture PENDING  Incomplete   Report Status PENDING  Incomplete  MRSA PCR Screening     Status: None   Collection Time: 06/04/18  6:13 AM  Result Value Ref Range Status   MRSA by PCR NEGATIVE NEGATIVE Final     Comment:        The GeneXpert MRSA Assay (FDA approved for NASAL specimens only), is one component of a comprehensive MRSA colonization surveillance program. It is not intended to diagnose MRSA infection nor to guide or monitor treatment for MRSA infections. Performed at [facility], [place],  [place], [place] [address]     Coagulation Studies: Recent Labs    06/04/18 0636  LABPROT 16.8*  INR 1.38    Urinalysis: No results for input(s): COLORURINE, LABSPEC, PHURINE, GLUCOSEU, HGBUR, BILIRUBINUR, KETONESUR, PROTEINUR, UROBILINOGEN, NITRITE, LEUKOCYTESUR in the last 72 hours.  Invalid input(s): APPERANCEUR    Imaging: Dg Chest 2 View  Result Date: 06/04/2018 CLINICAL DATA:  77 year old female with shortness of breath. EXAM: CHEST - 2 VIEW COMPARISON:  Chest radiograph dated 03/29/2017 FINDINGS: There is moderate cardiomegaly. Minimal vascular congestion. No edema. No focal consolidation, pleural effusion, or pneumothorax. No acute osseous pathology. IMPRESSION: Moderate cardiomegaly with minimal vascular congestion. No edema or focal consolidation. Electronically Signed   By: Anner Crete M.D.   On: 06/04/2018 04:01   Dg Lumbar Spine Complete  Result Date: 06/04/2018 CLINICAL DATA:  77 year old female with chronic lower back pain. EXAM: LUMBAR SPINE - COMPLETE 4+ VIEW COMPARISON:  Lumbar spine MRI dated 03/30/2018 FINDINGS: There is no acute fracture or subluxation of the lumbar spine. There is osteopenia with multilevel degenerative changes. L3-S1 disc spacers and posterior fixation rod and screws as well as L5-S1 anterior fusion noted. Overall these findings are similar to the radiograph of 03/09/2018. Apparent large pocket of air along the posterior lower lumbar spine at L4-S1 and along the fixation hardware likely represents superimposed bowel. Clinical correlation is recommended. There is atherosclerotic calcification of the aorta. IMPRESSION: 1. No acute  fracture or subluxation of the lumbar spine. Stable postoperative changes. 2. A pocket of air over the hardware, likely superimposed bowel. Electronically Signed   By: Anner Crete M.D.   On: 06/04/2018 04:09   US Renal  Result Date: 06/04/2018 CLINICAL DATA:  Acute renal failure EXAM: RENAL / URINARY TRACT ULTRASOUND COMPLETE COMPARISON:  CT 03/29/2017 FINDINGS: Right Kidney: Renal measurements: 9.6 x 5.7 x 5.2 cm = volume: 142 mL. Diffusely increased echotexture. No mass or hydronephrosis. Left Kidney: Renal measurements: 9.1 x 4.4 x 3.4 cm = volume: 71 mL. Diffusely increased echotexture. No mass or hydronephrosis. Bladder: Not visualized, decompressed with Foley catheter in place. IMPRESSION: Atrophic, echogenic kidneys compatible with chronic medical renal disease. No hydronephrosis. Electronically Signed   By: Rolm Baptise M.D.   On: 06/04/2018 09:48      Assessment & Plan: [name] is a 78 y.o. black female with hypertension, diabetes mellitus type II, hyperlipidemia, coronary artery disease, who was admitted to [facility] on 06/04/2018 for Anasarca [R60.1] Acute pulmonary edema ([place]) [J81.0] Hypoalbuminemia [E88.09] Acute renal failure (ARF) ([place]) [N17.9] Acute kidney injury ([place]) [N17.9] Hypertensive emergency [I16.1]  1. Acute renal failure on chronic kidney disease stage III: with proteinuria Baseline creatinine 1.69, GFR of 37.  Chronic kidney disease secondary to hypertension and diabetes  2. Hypertension: urgency on admission. Placed on nicardipine gtt on admission  3. Anemia with chronic kidney disease:  PRBC transfusion and IV venofer  4. Sepsis: wound infection - broad spectrum antibiotics - Pending cultures - MRI for tonight.      LOS: 0 Detric Scalisi 11/1/20195:48 PM

## 2018-06-04 NOTE — ED Notes (Signed)
Patient transported to X-ray 

## 2018-06-04 NOTE — ED Notes (Signed)
External catheter placed on pt to allow her to urinate. 2nd dose of labetalol given. Pt is pain free unless attempting to move and then pt has pain to the left leg. Unable to remove panties. Pt gave the permission to cut.

## 2018-06-04 NOTE — Consult Note (Signed)
[place] nurse consulted, surgeon consulted as well. Provided local topical care until surgical evaluation can take place.    Re consult if needed, will not follow at this time. Thanks  Millan Legan [employer], RN,CWOCN, CNS, [place] [phone])

## 2018-06-05 ENCOUNTER — Inpatient Hospital Stay (HOSPITAL_COMMUNITY)
Admission: AD | Admit: 2018-06-05 | Discharge: 2018-06-12 | DRG: 857 | Disposition: A | Discharge status: Home Health | Payer: Medicare Other | Source: Other Acute Inpatient Hospital | Attending: Internal Medicine | Admitting: Internal Medicine

## 2018-06-05 ENCOUNTER — Inpatient Hospital Stay (HOSPITAL_COMMUNITY): Payer: Medicare Other

## 2018-06-05 ENCOUNTER — Other Ambulatory Visit (HOSPITAL_COMMUNITY): Payer: Self-pay

## 2018-06-05 DIAGNOSIS — B964 Proteus (mirabilis) (morganii) as the cause of diseases classified elsewhere: Secondary | ICD-10-CM | POA: Diagnosis not present

## 2018-06-05 DIAGNOSIS — T8149XA Infection following a procedure, other surgical site, initial encounter: Secondary | ICD-10-CM | POA: Diagnosis not present

## 2018-06-05 DIAGNOSIS — T8463XD Infection and inflammatory reaction due to internal fixation device of spine, subsequent encounter: Secondary | ICD-10-CM

## 2018-06-05 DIAGNOSIS — I509 Heart failure, unspecified: Secondary | ICD-10-CM | POA: Diagnosis present

## 2018-06-05 DIAGNOSIS — I130 Hypertensive heart and chronic kidney disease with heart failure and stage 1 through stage 4 chronic kidney disease, or unspecified chronic kidney disease: Secondary | ICD-10-CM | POA: Diagnosis present

## 2018-06-05 DIAGNOSIS — Z91013 Allergy to seafood: Secondary | ICD-10-CM

## 2018-06-05 DIAGNOSIS — Z978 Presence of other specified devices: Secondary | ICD-10-CM | POA: Diagnosis not present

## 2018-06-05 DIAGNOSIS — N183 Chronic kidney disease, stage 3 unspecified: Secondary | ICD-10-CM | POA: Diagnosis present

## 2018-06-05 DIAGNOSIS — Z90710 Acquired absence of both cervix and uterus: Secondary | ICD-10-CM

## 2018-06-05 DIAGNOSIS — T8141XA Infection following a procedure, superficial incisional surgical site, initial encounter: Principal | ICD-10-CM | POA: Diagnosis present

## 2018-06-05 DIAGNOSIS — I10 Essential (primary) hypertension: Secondary | ICD-10-CM | POA: Diagnosis not present

## 2018-06-05 DIAGNOSIS — R0602 Shortness of breath: Secondary | ICD-10-CM

## 2018-06-05 DIAGNOSIS — E1122 Type 2 diabetes mellitus with diabetic chronic kidney disease: Secondary | ICD-10-CM | POA: Diagnosis not present

## 2018-06-05 DIAGNOSIS — T8463XA Infection and inflammatory reaction due to internal fixation device of spine, initial encounter: Secondary | ICD-10-CM | POA: Diagnosis present

## 2018-06-05 DIAGNOSIS — N179 Acute kidney failure, unspecified: Secondary | ICD-10-CM | POA: Diagnosis not present

## 2018-06-05 DIAGNOSIS — E66813 Obesity, class 3: Secondary | ICD-10-CM | POA: Diagnosis present

## 2018-06-05 DIAGNOSIS — M1990 Unspecified osteoarthritis, unspecified site: Secondary | ICD-10-CM | POA: Diagnosis present

## 2018-06-05 DIAGNOSIS — G8929 Other chronic pain: Secondary | ICD-10-CM | POA: Diagnosis present

## 2018-06-05 DIAGNOSIS — Z1619 Resistance to other specified beta lactam antibiotics: Secondary | ICD-10-CM | POA: Diagnosis not present

## 2018-06-05 DIAGNOSIS — D631 Anemia in chronic kidney disease: Secondary | ICD-10-CM | POA: Diagnosis present

## 2018-06-05 DIAGNOSIS — E785 Hyperlipidemia, unspecified: Secondary | ICD-10-CM | POA: Diagnosis present

## 2018-06-05 DIAGNOSIS — Z841 Family history of disorders of kidney and ureter: Secondary | ICD-10-CM | POA: Diagnosis not present

## 2018-06-05 DIAGNOSIS — N184 Chronic kidney disease, stage 4 (severe): Secondary | ICD-10-CM | POA: Diagnosis present

## 2018-06-05 DIAGNOSIS — Z9889 Other specified postprocedural states: Secondary | ICD-10-CM | POA: Diagnosis not present

## 2018-06-05 DIAGNOSIS — D72829 Elevated white blood cell count, unspecified: Secondary | ICD-10-CM | POA: Diagnosis not present

## 2018-06-05 DIAGNOSIS — D638 Anemia in other chronic diseases classified elsewhere: Secondary | ICD-10-CM | POA: Diagnosis present

## 2018-06-05 DIAGNOSIS — K760 Fatty (change of) liver, not elsewhere classified: Secondary | ICD-10-CM | POA: Diagnosis not present

## 2018-06-05 DIAGNOSIS — Z9049 Acquired absence of other specified parts of digestive tract: Secondary | ICD-10-CM | POA: Diagnosis not present

## 2018-06-05 DIAGNOSIS — Z6841 Body Mass Index (BMI) 40.0 and over, adult: Secondary | ICD-10-CM

## 2018-06-05 DIAGNOSIS — L02212 Cutaneous abscess of back [any part, except buttock]: Secondary | ICD-10-CM | POA: Diagnosis not present

## 2018-06-05 DIAGNOSIS — Z87442 Personal history of urinary calculi: Secondary | ICD-10-CM | POA: Diagnosis not present

## 2018-06-05 DIAGNOSIS — E039 Hypothyroidism, unspecified: Secondary | ICD-10-CM | POA: Diagnosis present

## 2018-06-05 DIAGNOSIS — E7849 Other hyperlipidemia: Secondary | ICD-10-CM | POA: Diagnosis not present

## 2018-06-05 DIAGNOSIS — Z888 Allergy status to other drugs, medicaments and biological substances status: Secondary | ICD-10-CM | POA: Diagnosis not present

## 2018-06-05 DIAGNOSIS — I700 Atherosclerosis of aorta: Secondary | ICD-10-CM | POA: Diagnosis not present

## 2018-06-05 DIAGNOSIS — Z833 Family history of diabetes mellitus: Secondary | ICD-10-CM | POA: Diagnosis not present

## 2018-06-05 DIAGNOSIS — E1129 Type 2 diabetes mellitus with other diabetic kidney complication: Secondary | ICD-10-CM | POA: Diagnosis present

## 2018-06-05 DIAGNOSIS — Z8619 Personal history of other infectious and parasitic diseases: Secondary | ICD-10-CM | POA: Diagnosis not present

## 2018-06-05 DIAGNOSIS — G061 Intraspinal abscess and granuloma: Secondary | ICD-10-CM | POA: Diagnosis not present

## 2018-06-05 DIAGNOSIS — Z832 Family history of diseases of the blood and blood-forming organs and certain disorders involving the immune mechanism: Secondary | ICD-10-CM | POA: Diagnosis not present

## 2018-06-05 DIAGNOSIS — Z8249 Family history of ischemic heart disease and other diseases of the circulatory system: Secondary | ICD-10-CM | POA: Diagnosis not present

## 2018-06-05 DIAGNOSIS — R600 Localized edema: Secondary | ICD-10-CM | POA: Diagnosis not present

## 2018-06-05 DIAGNOSIS — Y838 Other surgical procedures as the cause of abnormal reaction of the patient, or of later complication, without mention of misadventure at the time of the procedure: Secondary | ICD-10-CM | POA: Diagnosis present

## 2018-06-05 DIAGNOSIS — Z981 Arthrodesis status: Secondary | ICD-10-CM | POA: Diagnosis not present

## 2018-06-05 DIAGNOSIS — T8149XD Infection following a procedure, other surgical site, subsequent encounter: Secondary | ICD-10-CM | POA: Diagnosis not present

## 2018-06-05 DIAGNOSIS — Z91011 Allergy to milk products: Secondary | ICD-10-CM

## 2018-06-05 DIAGNOSIS — D649 Anemia, unspecified: Secondary | ICD-10-CM | POA: Diagnosis not present

## 2018-06-05 DIAGNOSIS — R809 Proteinuria, unspecified: Secondary | ICD-10-CM | POA: Diagnosis not present

## 2018-06-05 DIAGNOSIS — I161 Hypertensive emergency: Secondary | ICD-10-CM

## 2018-06-05 DIAGNOSIS — Z91018 Allergy to other foods: Secondary | ICD-10-CM

## 2018-06-05 DIAGNOSIS — I129 Hypertensive chronic kidney disease with stage 1 through stage 4 chronic kidney disease, or unspecified chronic kidney disease: Secondary | ICD-10-CM | POA: Diagnosis not present

## 2018-06-05 DIAGNOSIS — Z79899 Other long term (current) drug therapy: Secondary | ICD-10-CM

## 2018-06-05 LAB — COMPREHENSIVE METABOLIC PANEL
ALT: 11 U/L (ref 0–44)
ANION GAP: 6 (ref 5–15)
AST: 15 U/L (ref 15–41)
Albumin: 1.6 g/dL — ABNORMAL LOW (ref 3.5–5.0)
Alkaline Phosphatase: 58 U/L (ref 38–126)
BUN: 46 mg/dL — ABNORMAL HIGH (ref 8–23)
CALCIUM: 8.3 mg/dL — AB (ref 8.9–10.3)
CO2: 25 mmol/L (ref 22–32)
Chloride: 105 mmol/L (ref 98–111)
Creatinine, Ser: 2.19 mg/dL — ABNORMAL HIGH (ref 0.44–1.00)
GFR, EST AFRICAN AMERICAN: 24 mL/min — AB (ref >60)
GFR, EST NON AFRICAN AMERICAN: 21 mL/min — AB (ref >60)
Glucose, Bld: 112 mg/dL — ABNORMAL HIGH (ref 70–99)
Potassium: 3.6 mmol/L (ref 3.5–5.1)
Sodium: 136 mmol/L (ref 135–145)
Total Bilirubin: 0.6 mg/dL (ref 0.3–1.2)
Total Protein: 6.2 g/dL — ABNORMAL LOW (ref 6.5–8.1)

## 2018-06-05 LAB — TYPE AND SCREEN
ABO/RH(D): A POS
ABO/RH(D): A POS
ANTIBODY SCREEN: NEGATIVE
Antibody Screen: NEGATIVE
UNIT DIVISION: 0
Unit division: 0
Unit division: 0
Unit division: 0

## 2018-06-05 LAB — RETICULOCYTES
Immature Retic Fract: 29.8 % — ABNORMAL HIGH (ref 2.3–15.9)
RBC.: 2.67 MIL/uL — AB (ref 3.87–5.11)
RETIC COUNT ABSOLUTE: 28 10*3/uL (ref 19.0–186.0)
RETIC CT PCT: 1.1 % (ref 0.4–3.1)

## 2018-06-05 LAB — PROTIME-INR
INR: 1.45
Prothrombin Time: 17.5 seconds — ABNORMAL HIGH (ref 11.4–15.2)

## 2018-06-05 LAB — BASIC METABOLIC PANEL
Anion gap: 9 (ref 5–15)
BUN: 52 mg/dL — AB (ref 8–23)
CHLORIDE: 104 mmol/L (ref 98–111)
CO2: 25 mmol/L (ref 22–32)
CREATININE: 2.23 mg/dL — AB (ref 0.44–1.00)
Calcium: 8.1 mg/dL — ABNORMAL LOW (ref 8.9–10.3)
GFR calc non Af Amer: 20 mL/min — ABNORMAL LOW (ref >60)
GFR, EST AFRICAN AMERICAN: 24 mL/min — AB (ref >60)
GLUCOSE: 115 mg/dL — AB (ref 70–99)
Potassium: 3.9 mmol/L (ref 3.5–5.1)
Sodium: 138 mmol/L (ref 135–145)

## 2018-06-05 LAB — VITAMIN B12: VITAMIN B 12: 1085 pg/mL — AB (ref 180–914)

## 2018-06-05 LAB — BPAM RBC
BLOOD PRODUCT EXPIRATION DATE: 201911082359
BLOOD PRODUCT EXPIRATION DATE: 201911282359
BLOOD PRODUCT EXPIRATION DATE: 201911292359
Blood Product Expiration Date: 201911292359
ISSUE DATE / TIME: 201911011705
UNIT TYPE AND RH: 6200
UNIT TYPE AND RH: 6200
Unit Type and Rh: 6200
Unit Type and Rh: 6200

## 2018-06-05 LAB — T3, FREE: T3, Free: 1.3 pg/mL — ABNORMAL LOW (ref 2.0–4.4)

## 2018-06-05 LAB — CBC
HCT: 22.3 % — ABNORMAL LOW (ref 36.0–46.0)
HEMATOCRIT: 21.9 % — AB (ref 36.0–46.0)
HEMOGLOBIN: 7 g/dL — AB (ref 12.0–15.0)
Hemoglobin: 7 g/dL — ABNORMAL LOW (ref 12.0–15.0)
MCH: 26.7 pg (ref 26.0–34.0)
MCH: 26.2 pg (ref 26.0–34.0)
MCHC: 32 g/dL (ref 30.0–36.0)
MCHC: 31.4 g/dL (ref 30.0–36.0)
MCV: 83.6 fL (ref 80.0–100.0)
MCV: 83.5 fL (ref 80.0–100.0)
PLATELETS: 383 10*3/uL (ref 150–400)
Platelets: 367 10*3/uL (ref 150–400)
RBC: 2.62 MIL/uL — ABNORMAL LOW (ref 3.87–5.11)
RBC: 2.67 MIL/uL — ABNORMAL LOW (ref 3.87–5.11)
RDW: 16.1 % — AB (ref 11.5–15.5)
RDW: 16 % — ABNORMAL HIGH (ref 11.5–15.5)
WBC: 15.6 10*3/uL — AB (ref 4.0–10.5)
WBC: 15.9 10*3/uL — ABNORMAL HIGH (ref 4.0–10.5)
nRBC: 0.1 % (ref 0.0–0.2)
nRBC: 0.1 % (ref 0.0–0.2)

## 2018-06-05 LAB — GLUCOSE, CAPILLARY
Glucose-Capillary: 92 mg/dL (ref 70–99)
Glucose-Capillary: 105 mg/dL — ABNORMAL HIGH (ref 70–99)

## 2018-06-05 LAB — HEMOGLOBIN A1C
HEMOGLOBIN A1C: 6.4 % — AB (ref 4.8–5.6)
MEAN PLASMA GLUCOSE: 136.98 mg/dL

## 2018-06-05 LAB — FERRITIN: Ferritin: 714 ng/mL — ABNORMAL HIGH (ref 11–307)

## 2018-06-05 LAB — PREPARE RBC (CROSSMATCH)

## 2018-06-05 LAB — IRON AND TIBC
Iron: 29 ug/dL (ref 28–170)
SATURATION RATIOS: 19 % (ref 10.4–31.8)
TIBC: 153 ug/dL — ABNORMAL LOW (ref 250–450)
UIBC: 124 ug/dL

## 2018-06-05 LAB — MRSA PCR SCREENING: MRSA BY PCR: NEGATIVE

## 2018-06-05 LAB — ECHOCARDIOGRAM COMPLETE

## 2018-06-05 LAB — FOLATE: FOLATE: 8.8 ng/mL (ref >5.9)

## 2018-06-05 MED ORDER — VANCOMYCIN HCL IN DEXTROSE 1-5 GM/200ML-% IV SOLN
1000.0000 mg | INTRAVENOUS | Status: DC
  Administered 2018-06-05 – 2018-06-07 (×3): 1000 mg via INTRAVENOUS
  Filled 2018-06-05 (×3): qty 200

## 2018-06-05 MED ORDER — AMLODIPINE BESYLATE 10 MG PO TABS
10.0000 mg | ORAL_TABLET | Freq: Every day | ORAL | Status: DC
  Administered 2018-06-05 – 2018-06-11 (×6): 10 mg via ORAL
  Filled 2018-06-05 (×7): qty 1

## 2018-06-05 MED ORDER — DIPHENHYDRAMINE HCL 50 MG/ML IJ SOLN
25.0000 mg | Freq: Four times a day (QID) | INTRAMUSCULAR | Status: DC | PRN
  Filled 2018-06-05: qty 1

## 2018-06-05 MED ORDER — SODIUM CHLORIDE 0.9 % IV SOLN
1.0000 g | Freq: Two times a day (BID) | INTRAVENOUS | Status: DC
  Administered 2018-06-06: 1 g via INTRAVENOUS
  Filled 2018-06-05 (×2): qty 1

## 2018-06-05 MED ORDER — POTASSIUM CHLORIDE IN NACL 20-0.9 MEQ/L-% IV SOLN: INTRAVENOUS | Status: DC

## 2018-06-05 MED ORDER — ISOSORBIDE MONONITRATE ER 60 MG PO TB24
60.0000 mg | ORAL_TABLET | Freq: Every day | ORAL | Status: DC
  Administered 2018-06-07 – 2018-06-11 (×5): 60 mg via ORAL
  Filled 2018-06-05 (×5): qty 1

## 2018-06-05 MED ORDER — CLONIDINE HCL 0.1 MG PO TABS: 0.1000 mg | ORAL_TABLET | Freq: Four times a day (QID) | ORAL | Status: DC | PRN

## 2018-06-05 MED ORDER — METOPROLOL TARTRATE 5 MG/5ML IV SOLN
5.0000 mg | INTRAVENOUS | Status: DC | PRN
  Administered 2018-06-06 – 2018-06-07 (×6): 5 mg via INTRAVENOUS
  Filled 2018-06-05 (×6): qty 5

## 2018-06-05 MED ORDER — ISOSORBIDE MONONITRATE ER 30 MG PO TB24
30.0000 mg | ORAL_TABLET | Freq: Once | ORAL | Status: AC
  Administered 2018-06-05: 30 mg via ORAL
  Filled 2018-06-05: qty 1

## 2018-06-05 MED ORDER — FUROSEMIDE 10 MG/ML IJ SOLN
60.0000 mg | Freq: Once | INTRAMUSCULAR | Status: AC
  Administered 2018-06-05: 60 mg via INTRAVENOUS
  Filled 2018-06-05: qty 6

## 2018-06-05 MED ORDER — CARVEDILOL 6.25 MG PO TABS: 6.2500 mg | ORAL_TABLET | Freq: Two times a day (BID) | ORAL | Status: DC

## 2018-06-05 MED ORDER — ISOSORBIDE MONONITRATE ER 30 MG PO TB24: 30.0000 mg | ORAL_TABLET | Freq: Every day | ORAL | 0 refills | Status: DC

## 2018-06-05 MED ORDER — HEPARIN SODIUM (PORCINE) 5000 UNIT/ML IJ SOLN
5000.0000 [IU] | Freq: Three times a day (TID) | INTRAMUSCULAR | Status: DC
  Administered 2018-06-05 – 2018-06-12 (×19): 5000 [IU] via SUBCUTANEOUS
  Filled 2018-06-05 (×20): qty 1

## 2018-06-05 MED ORDER — ISOSORBIDE MONONITRATE ER 30 MG PO TB24: 30.0000 mg | ORAL_TABLET | Freq: Every day | ORAL | Status: DC

## 2018-06-05 MED ORDER — CLONIDINE HCL 0.1 MG PO TABS
0.1000 mg | ORAL_TABLET | Freq: Two times a day (BID) | ORAL | Status: DC
  Administered 2018-06-05: 0.1 mg via ORAL
  Filled 2018-06-05: qty 1

## 2018-06-05 MED ORDER — MORPHINE SULFATE (PF) 2 MG/ML IV SOLN
2.0000 mg | INTRAVENOUS | Status: DC | PRN
  Administered 2018-06-06 – 2018-06-09 (×2): 2 mg via INTRAVENOUS
  Filled 2018-06-05 (×2): qty 1

## 2018-06-05 MED ORDER — ISOSORBIDE MONONITRATE ER 30 MG PO TB24
30.0000 mg | ORAL_TABLET | Freq: Every day | ORAL | Status: DC
  Administered 2018-06-05: 30 mg via ORAL
  Filled 2018-06-05 (×2): qty 1

## 2018-06-05 MED ORDER — SODIUM CHLORIDE 0.9% IV SOLUTION: Freq: Once | INTRAVENOUS | Status: DC

## 2018-06-05 MED ORDER — CARVEDILOL 12.5 MG PO TABS
12.5000 mg | ORAL_TABLET | Freq: Two times a day (BID) | ORAL | Status: DC
  Administered 2018-06-05: 12.5 mg via ORAL
  Filled 2018-06-05 (×2): qty 1

## 2018-06-05 MED ORDER — LEVOTHYROXINE SODIUM 112 MCG PO TABS
112.0000 ug | ORAL_TABLET | Freq: Every day | ORAL | Status: DC
  Administered 2018-06-06 – 2018-06-12 (×7): 112 ug via ORAL
  Filled 2018-06-05 (×7): qty 1

## 2018-06-05 MED ORDER — SODIUM CHLORIDE 0.9% FLUSH
3.0000 mL | Freq: Two times a day (BID) | INTRAVENOUS | Status: DC
  Administered 2018-06-05 – 2018-06-11 (×12): 3 mL via INTRAVENOUS

## 2018-06-05 MED ORDER — CARVEDILOL 3.125 MG PO TABS: 3.1250 mg | ORAL_TABLET | Freq: Two times a day (BID) | ORAL | Status: DC

## 2018-06-05 MED ORDER — HYDROCODONE-ACETAMINOPHEN 5-325 MG PO TABS
1.0000 | ORAL_TABLET | Freq: Four times a day (QID) | ORAL | Status: DC | PRN
  Administered 2018-06-05 – 2018-06-11 (×11): 1 via ORAL
  Filled 2018-06-05 (×12): qty 1

## 2018-06-05 MED ORDER — FUROSEMIDE 10 MG/ML IJ SOLN: 40.0000 mg | Freq: Once | INTRAMUSCULAR | Status: DC

## 2018-06-05 NOTE — Progress Notes (Signed)
[facility]  ROUNDING NOTE   Subjective:   Creatinine 2.23 (2.36)  MRI with concern of osteomyelitis - patient to be transferred to [facility].   Objective:  Vital signs in last 24 hours:  Temp:  [98.7 F (37.1 C)-100.4 F (38 C)] 98.7 F (37.1 C) (11/02 0200) Pulse Rate:  [65-91] 65 (11/02 0200) Resp:  [15-23] 15 (11/02 0200) BP: (143-205)/(57-85) 160/61 (11/02 0200) SpO2:  [92 %-100 %] 97 % (11/02 0200) Weight:  [106.3 kg] 106.3 kg (11/02 0500)  Weight change: 11 kg Filed Weights   06/03/18 2353 06/05/18 0500  Weight: 95.3 kg 106.3 kg    Intake/Output: I/O last 3 completed shifts: In: 2502.6 [P.O.:820; I.V.:396.7; Blood:870; IV Piggyback:415.9] Out: 1500 [Urine:1500]   Intake/Output this shift:  No intake/output data recorded.  Physical Exam: General: NAD,   Head: Normocephalic, atraumatic. Moist oral mucosal membranes  Eyes: Anicteric, PERRL  Neck: Supple, trachea midline  Lungs:  Clear to auscultation  Heart: Regular rate and rhythm  Abdomen:  Soft, nontender,   Extremities:  + peripheral edema. Left hip tenderness  Neurologic: Nonfocal, moving all four extremities  Skin: No lesions        Basic Metabolic Panel: Recent Labs  Lab 06/04/18 0245 06/04/18 1315 06/05/18 0522  NA 138 137 138  K 3.9 4.3 3.9  CL 104 104 104  CO2 25 24 25   GLUCOSE 114* 199* 115*  BUN 58* 51* 52*  CREATININE 2.45* 2.36* 2.23*  CALCIUM 8.7* 8.3* 8.1*    Liver Function Tests: Recent Labs  Lab 06/04/18 0245  AST 17  ALT 12  ALKPHOS 75  BILITOT 0.6  PROT 7.3  ALBUMIN 2.3*   No results for input(s): LIPASE, AMYLASE in the last 168 hours. No results for input(s): AMMONIA in the last 168 hours.  CBC: Recent Labs  Lab 06/04/18 0245 06/04/18 1315 06/05/18 0522  WBC 17.7* 18.8* 15.6*  NEUTROABS 13.4*  --   --   HGB 7.7* 6.8* 7.0*  HCT 24.8* 21.3* 21.9*  MCV 84.6 81.3 83.6  PLT 456* 355 367    Cardiac Enzymes: Recent Labs  Lab 06/04/18 0245  06/04/18 0636 06/04/18 1315 06/04/18 2316  TROPONINI 0.03* 0.03* 0.07* 0.03*    BNP: Invalid input(s): POCBNP  CBG: Recent Labs  Lab 06/04/18 0543 06/04/18 1956 06/05/18 0758  GLUCAP 105* 153* 6    Microbiology: Results for orders placed or performed during the hospital encounter of 06/04/18  Aerobic/Anaerobic Culture (surgical/deep wound)     Status: None (Preliminary result)   Collection Time: 06/04/18  6:11 AM  Result Value Ref Range Status   Specimen Description   Final    BACK Performed at [facility], [address] [place], [place] [address]    Special Requests   Final    Normal Performed at [facility], [address] [place], [place] [address]    Gram Stain   Final    RARE WBC PRESENT, PREDOMINANTLY PMN RARE GRAM POSITIVE RODS RARE GRAM POSITIVE COCCI Performed at [facility], [place] [address], [place], [place] [address]    Culture PENDING  Incomplete   Report Status PENDING  Incomplete  MRSA PCR Screening     Status: None   Collection Time: 06/04/18  6:13 AM  Result Value Ref Range Status   MRSA by PCR NEGATIVE NEGATIVE Final    Comment:        The GeneXpert MRSA Assay (FDA approved for NASAL specimens only), is  one component of a comprehensive MRSA colonization surveillance program. It is not intended to diagnose MRSA infection nor to guide or monitor treatment for MRSA infections. Performed at [facility], [address] [place], [place] [address]   Culture, blood (Routine X 2) w Reflex to ID Panel     Status: None (Preliminary result)   Collection Time: 06/04/18 11:15 PM  Result Value Ref Range Status   Specimen Description BLOOD RIGHT HAND  Final   Special Requests   Final    BOTTLES DRAWN AEROBIC AND ANAEROBIC Blood Culture adequate volume   Culture   Final    NO GROWTH < 12 HOURS Performed at [facility], [address], [place], [place] [address]    Report Status PENDING  Incomplete   Culture, blood (Routine X 2) w Reflex to ID Panel     Status: None (Preliminary result)   Collection Time: 06/04/18 11:17 PM  Result Value Ref Range Status   Specimen Description BLOOD LEFT ANTECUBITAL  Final   Special Requests   Final    BOTTLES DRAWN AEROBIC AND ANAEROBIC Blood Culture adequate volume   Culture   Final    NO GROWTH < 12 HOURS Performed at [facility], [address], [place], [place] [address]    Report Status PENDING  Incomplete    Coagulation Studies: Recent Labs    06/04/18 0636  LABPROT 16.8*  INR 1.38    Urinalysis: No results for input(s): COLORURINE, LABSPEC, PHURINE, GLUCOSEU, HGBUR, BILIRUBINUR, KETONESUR, PROTEINUR, UROBILINOGEN, NITRITE, LEUKOCYTESUR in the last 72 hours.  Invalid input(s): APPERANCEUR    Imaging: Dg Chest 2 View  Result Date: 06/04/2018 CLINICAL DATA:  77 year old female with shortness of breath. EXAM: CHEST - 2 VIEW COMPARISON:  Chest radiograph dated 03/29/2017 FINDINGS: There is moderate cardiomegaly. Minimal vascular congestion. No edema. No focal consolidation, pleural effusion, or pneumothorax. No acute osseous pathology. IMPRESSION: Moderate cardiomegaly with minimal vascular congestion. No edema or focal consolidation. Electronically Signed   By: Anner Crete M.D.   On: 06/04/2018 04:01   Dg Lumbar Spine Complete  Result Date: 06/04/2018 CLINICAL DATA:  77 year old female with chronic lower back pain. EXAM: LUMBAR SPINE - COMPLETE 4+ VIEW COMPARISON:  Lumbar spine MRI dated 03/30/2018 FINDINGS: There is no acute fracture or subluxation of the lumbar spine. There is osteopenia with multilevel degenerative changes. L3-S1 disc spacers and posterior fixation rod and screws as well as L5-S1 anterior fusion noted. Overall these findings are similar to the radiograph of 03/09/2018. Apparent large pocket of air along the posterior lower lumbar spine at L4-S1 and along the fixation hardware likely represents  superimposed bowel. Clinical correlation is recommended. There is atherosclerotic calcification of the aorta. IMPRESSION: 1. No acute fracture or subluxation of the lumbar spine. Stable postoperative changes. 2. A pocket of air over the hardware, likely superimposed bowel. Electronically Signed   By: Anner Crete M.D.   On: 06/04/2018 04:09   Mr Lumbar Spine Wo Contrast  Result Date: 06/04/2018 CLINICAL DATA:  Fluid leaking from spinal wound incision, history of multiple lumbar spine surgeries. Status post lumbar spine wound debridement March 30, 2017. EXAM: MRI LUMBAR SPINE WITHOUT CONTRAST TECHNIQUE: Multiplanar, multisequence MR imaging of the lumbar spine was performed. No intravenous contrast was administered. COMPARISON:  Lumbar spine radiographs June 04, 2018 and MRI lumbar spine March 30, 2018 FINDINGS: Moderately motion degraded examination. SEGMENTATION: For the purposes of this report, the last well-formed intervertebral disc is reported as L5-S1. ALIGNMENT: Maintained  lumbar lordosis. Minimal grade 1 L2-3 retrolisthesis. No spondylolysis. VERTEBRAE:Status post L3 through S1 PLIF, susceptibility artifact from hardware. Mild L1-2 disc height loss with mild disc desiccation. No suspicious or acute bone marrow signal the limited by hardware artifact and patient motion. CONUS MEDULLARIS AND CAUDA EQUINA: Conus medullaris terminates at L1-2 and demonstrates normal morphology and signal characteristics. Limited assessment of cauda equina due to hardware artifact common no nerve root clumping. PARASPINAL AND OTHER SOFT TISSUES: Multiple air-fluid levels within the paraspinal muscles and subcutaneous fat along surgical approach and within surgical bed. Postoperative paraspinal muscle denervation with superimposed RIGHT interstitial STIR signal. DISC LEVELS (limited assessment due to hardware artifact and motion): L1-2: Small broad-based disc bulge, mild facet arthropathy and ligamentum flavum  redundancy. No canal stenosis. Mild bilateral neural foraminal narrowing. L2-3: Retrolisthesis. Moderate broad-based disc bulge. Moderate canal stenosis. Mild neural foraminal narrowing may be overestimated by hardware artifact. L3-4: PLIF, posterior decompression. No canal stenosis or definite neural foraminal narrowing. L4-5: PLIF and posterior decompression. No canal stenosis. Moderate possible LEFT neural foraminal narrowing, potentially overestimated by hardware artifact. L5-S1: PLIF and posterior decompression. No canal stenosis. Small suspected central disc extrusion, stable from prior examination. No definite neural foraminal narrowing. IMPRESSION: 1. Air-fluid levels within paraspinal muscles and subcutaneous fat along surgical approach and within surgical bed, query recent intervention, alternatively this could reflect infection. Additional paraspinal muscle denervation versus myositis. 2. Status post L3 through S1 PLIF and posterior decompression. Minimal grade 1 L2-3 retrolisthesis. 3. Moderate canal stenosis L2-3. Multilevel suspected neural foraminal narrowing, potentially moderate on the LEFT at L4-5. 4. These results will be called to the ordering clinician or representative by the Radiologist Assistant, and communication documented in the PACS or zVision Dashboard. Electronically Signed   By: Elon Alas M.D.   On: 06/04/2018 23:14   US Renal  Result Date: 06/04/2018 CLINICAL DATA:  Acute renal failure EXAM: RENAL / URINARY TRACT ULTRASOUND COMPLETE COMPARISON:  CT 03/29/2017 FINDINGS: Right Kidney: Renal measurements: 9.6 x 5.7 x 5.2 cm = volume: 142 mL. Diffusely increased echotexture. No mass or hydronephrosis. Left Kidney: Renal measurements: 9.1 x 4.4 x 3.4 cm = volume: 71 mL. Diffusely increased echotexture. No mass or hydronephrosis. Bladder: Not visualized, decompressed with Foley catheter in place. IMPRESSION: Atrophic, echogenic kidneys compatible with chronic medical renal  disease. No hydronephrosis. Electronically Signed   By: Rolm Baptise M.D.   On: 06/04/2018 09:48     Medications:   . ceFEPime (MAXIPIME) IV Stopped (06/04/18 1235)  . iron sucrose Stopped (06/04/18 1746)  . metronidazole 500 mg (06/05/18 0532)  . vancomycin 200 mL/hr at 06/04/18 1819   . cloNIDine  0.1 mg Oral BID  . docusate sodium  100 mg Oral BID  . Influenza vac split quadrivalent PF  0.5 mL Intramuscular Tomorrow-1000  . isosorbide mononitrate  15 mg Oral Daily  . labetalol  100 mg Oral BID  . levothyroxine  112 mcg Oral QAC breakfast  . losartan  100 mg Oral Daily  . multivitamin-lutein  1 capsule Oral Daily  . protein supplement shake  11 oz Oral BID BM  . vitamin C  250 mg Oral BID   acetaminophen **OR** acetaminophen, labetalol, morphine injection, ondansetron **OR** ondansetron (ZOFRAN) IV, oxyCODONE-acetaminophen  Assessment/ Plan:  [name] is a 77 y.o. black female  with hypertension, diabetes mellitus type II, hyperlipidemia, coronary artery disease, who was admitted to [facility] on 06/04/2018 for lumbar abscess  1. Acute renal failure on chronic kidney  disease stage III: with proteinuria Baseline creatinine 1.69, GFR of 37.  Chronic kidney disease secondary to hypertension and diabetes Acute kidney failure secondary to sepsis and NSAIDs - No longer on IV fluids  2. Hypertension: urgency on admission. Placed on nicardipine gtt on admission Still mildly elevated.  - losartan, labetalol, imdur, and clonidine  3. Anemia with chronic kidney disease: hemoglobin 7  PRBC transfusion and IV venofer  4. Sepsis: wound infection - broad spectrum antibiotics: vancomycin, metronidazole and cefepime - Pending transfer to [facility] for neurosurgical intervention   LOS: 1 Tobey Schmelzle 11/2/20199:01 AM

## 2018-06-05 NOTE — Progress Notes (Addendum)
Name: Taylor Tyler MRN: [id number] DOB: 11-10-1940     CONSULTATION DATE: 06/04/2018  Subjective & objective: Tolerating room air and awaiting bed availability at [facility] for further management by neurosurgery  PAST MEDICAL HISTORY :   has a past medical history of Acute blood loss anemia (03/14/2017), Acute renal failure superimposed on stage 3 chronic kidney disease ([place]) (03/14/2017), AKI (acute kidney injury) ([place]) (02/28/2017), Altered mental status (03/29/2017), Anemia, Aortic atherosclerosis ([place]), Arthritis, Bilateral lower extremity edema (03/04/2017), Bradycardia, Cataract, Chronic kidney disease, CKD (chronic kidney disease) stage 3, GFR 30-59 ml/min ([place]) (01/15/2017), Diabetes mellitus without complication ([place]), Disease of pancreas, Diverticulitis, Diverticulosis (01/15/2017), DJD (degenerative joint disease), DM (diabetes mellitus), type 2 with renal complications ([place]) (04/26/3006), Fatty liver, Hypertension, Hypothyroidism, Hypothyroidism, Kidney stone, Lethargy (02/28/2017), Obesity, Class III, BMI 40-49.9 (morbid obesity) ([place]) (02/27/2017), Pleural lipoma, Postoperative wound infection (04/02/2017), Spinal stenosis of lumbar region (01/15/2017), Status post lumbar surgery (02/24/2017), Ventral hernia, and Wound healing, delayed.  has a past surgical history that includes Cholecystectomy (1982); Lumbar laminectomy with spinous process plate 2 level (N/A, 01/24/6332); Gallbladder surgery (1982); Back surgery (2013); Eye surgery (Right, 2017); Lumbar fusion (02/24/2017); Anterior lumbar fusion (N/A, 02/24/2017); Anterior lat lumbar fusion (N/A, 02/24/2017); Abdominal exposure (N/A, 02/24/2017); Application of robotic assistance for spinal procedure (N/A, 02/26/2017); Lumbar wound debridement (N/A, 03/30/2017); Application if wound vac (N/A, 03/30/2017); Abdominal hysterectomy (1987); Partial colectomy; Ventral hernia repair; Appendectomy; Breast surgery (1992); Colon surgery (01/26/2014); and Colon  surgery. Prior to Admission medications   Medication Sig Start Date End Date Taking? Authorizing Provider  acetaminophen (TYLENOL) 500 MG tablet Take 2 tablets (1,000 mg total) by mouth 3 (three) times daily. 04/04/17  Yes Everrett Coombe, MD  Cholecalciferol 1000 units capsule Take 1 capsule (1,000 Units total) by mouth daily. 08/06/17  Yes McLean-Scocuzza, Nino Glow, MD  cloNIDine (CATAPRES) 0.1 MG tablet Take 1 tablet (0.1 mg total) by mouth 2 (two) times daily. 01/29/18  Yes McLean-Scocuzza, Nino Glow, MD  diclofenac sodium (VOLTAREN) 1 % GEL Apply 4 g topically 4 (four) times daily. Knees b/l Patient taking differently: Apply 4 g topically 4 (four) times daily as needed (pain). Knees b/l 01/05/18  Yes McLean-Scocuzza, Nino Glow, MD  ferrous sulfate 325 (65 FE) MG tablet Take 1 tablet (325 mg total) by mouth daily with lunch. 08/06/17  Yes McLean-Scocuzza, Nino Glow, MD  furosemide (LASIX) 40 MG tablet Take 1 tablet (40 mg total) by mouth daily. In am 01/25/18  Yes McLean-Scocuzza, Nino Glow, MD  gabapentin (NEURONTIN) 300 MG capsule Take 1 capsule (300 mg total) by mouth at bedtime. Patient taking differently: Take 300 mg by mouth at bedtime as needed (pain).  01/05/18  Yes McLean-Scocuzza, Nino Glow, MD  labetalol (NORMODYNE) 100 MG tablet Take 100 mg by mouth 2 (two) times daily.  02/17/18 02/17/19 Yes [provider]  levothyroxine (SYNTHROID, LEVOTHROID) 112 MCG tablet Take 1 tablet (112 mcg total) by mouth daily before breakfast. 04/28/18  Yes McLean-Scocuzza, Nino Glow, MD  losartan (COZAAR) 100 MG tablet Take 1 tablet (100 mg total) by mouth daily after breakfast. 02/11/18  Yes McLean-Scocuzza, Nino Glow, MD  Magnesium Oxide 400 MG CAPS Take 1 capsule (400 mg total) by mouth daily. 02/08/18  Yes McLean-Scocuzza, Nino Glow, MD  vitamin C (ASCORBIC ACID) 500 MG tablet Take 500 mg by mouth daily.    Yes [provider]  atorvastatin (LIPITOR) 40 MG tablet Take 1 tablet (40 mg total) by mouth every  evening. Patient not taking:  Reported on 06/04/2018 02/11/18   McLean-Scocuzza, Nino Glow, MD  blood glucose meter kit and supplies KIT Dispense based on patient and insurance preference. Use up to four times daily as directed. (FOR ICD-9 250.00, 250.01). 08/31/17   McLean-Scocuzza, Nino Glow, MD  doxycycline (VIBRA-TABS) 100 MG tablet Take 1 tablet (100 mg total) by mouth 2 (two) times daily. With food Patient not taking: Reported on 06/04/2018 04/19/18   McLean-Scocuzza, Nino Glow, MD   Allergies  Allergen Reactions  . Clams [Shellfish Allergy] Swelling and Other (See Comments)    THROAT SWELLS NECK TURNS RED  . Hydralazine Other (See Comments)    CHEST TIGHTNESS  . Norvasc [Amlodipine Besylate] Swelling    Leg edema   . Milk-Related Compounds Diarrhea    FAMILY HISTORY:  family history includes Diabetes in her brother, brother, father, and mother; Heart attack in her father; Heart disease in her brother, brother, father, and mother; Hypertension in her father and mother; Kidney disease in her father and mother; Lupus in her sister and sister. SOCIAL HISTORY:  reports that she has never smoked. She has never used smokeless tobacco. She reports that she does not drink alcohol or use drugs.  REVIEW OF SYSTEMS:   Unable to obtain due to critical illness   VITAL SIGNS: Temp:  [98.7 F (37.1 C)-100.4 F (38 C)] 98.7 F (37.1 C) (11/02 0200) Pulse Rate:  [65-91] 65 (11/02 0200) Resp:  [15-23] 15 (11/02 0200) BP: (143-205)/(57-85) 160/61 (11/02 0200) SpO2:  [92 %-100 %] 97 % (11/02 0200) Weight:  [106.3 kg] 106.3 kg (11/02 0500)  Physical Examination:  Awake and oriented and no focal neurological deficits Tolerating room air, able to talk in full sentences, no distress, bilateral equal air entry and no adventitious sounds S1 & S2 are audible with no murmur Benign abdominal exam with normal peristalsis No leg edema Sinus lower back midline incision with significant amount of purulent  drainage and mild surrounding cellulitis ASSESSMENT / PLAN:  Deep wound infection lower back status post L3-S1 posterior decompression with internal fixation, air-fluid level within paraspinal muscle subcutaneous fat was concern for deep wound infection.  Management as per neurosurgery advised neuro check every 2 hours.  The case is awaiting transfer to [facility] for possible intervention, wound debridement hardware removal. -Vancomycin + cefepime + Flagyl.  Wound culture GPC follow final culture result.  Antimicrobial as per ID recommendation -Surgical consult advised transfer to neurosurgery  AKI (improved) on chronic kidney disease stage III -Optimize hydration, avoid nephrotoxins, monitor renal panel and urine output  Hypertensive emergency -Optimize antihypertensives and monitor hemodynamics  Atelectasis.  Bibasilar airspace disease -Incentive spirometer, monitor chest x-ray and white cell count  Hypothyroidism -Optimize levothyroxine and monitor free T4  Anemia with chronic kidney disease -Keep hemoglobin more than 7 g/dL  Full code  Supportive care  Critical care time 40 minutes  Case was discussed with neurosurgical service cones (Advanced Provider with Dr. Felicie Morn) they advised the patient could be managed in stepdown. Case was discussed with hospitalist provider at the [facility] ( Dr. Earnest Conroy) who kindly accepted the transfer.

## 2018-06-05 NOTE — H&P (Signed)
TRH H&P   Patient Demographics:    Taylor Tyler, is a 77 y.o. female  MRN: [id number]   DOB - [birth year]  Admit Date - 06/05/2018  Outpatient Primary MD for the patient is McLean-Scocuzza, Nino Glow, MD    Patient coming from:     Home >> [facility]  CC Spine Infection    HPI:    Taylor Tyler  is a 77 y.o. AA female with H/O Morbid Obesity, CKD 3, DM2, HTN, anemia of chronic disease, fatty liver, hypothyroidism, status post L spine surgery along with L5-S1 fusion done in July 2018 at [facility] by Dr. Cyndy Freeze, lives in [facility] by herself and presented to [facility] hospital few days ago with 1 month duration of low back pain mostly on the left side radiating down to her left leg and some discharge from her postop back scar, at the hospital she was diagnosed with postop site infection and abscess with pus drainage, poorly controlled blood pressure, she was admitted to the hospital seen by ID, her case was discussed by neurosurgery at [facility] and then she was transferred here for further care.  Patient currently is in bed, in no distress however complains of some low back pain, she denies any headache chest pain palpitations or shortness of breath, no abdominal pain, no bowel or bladder incontinence which is new, no focal weakness but states she has chronic left lower extremity weakness and attributes that to her acute on chronic low back pain, she uses a cane for several months.  No other subjective complaints.    Review of systems:    A full 10 point Review of Systems was done, except as stated above, all other Review of Systems were negative.  With Past History of the following :     Past Medical History:  Diagnosis Date  . Acute blood loss anemia 03/14/2017  . Acute renal failure superimposed on stage 3 chronic kidney disease ([place]) 03/14/2017  . AKI (acute kidney injury) ([place]) 02/28/2017  . Altered mental status 03/29/2017  . Anemia   . Aortic atherosclerosis ([place])   . Arthritis   . Bilateral lower extremity edema 03/04/2017  . Bradycardia   . Cataract   . Chronic kidney disease    ?? renal insufficiency,   . CKD (chronic kidney disease)  stage 3, GFR 30-59 ml/min (HCC) 01/15/2017   ?? renal insufficiency, which she thinks is coming from "all these medications"  . Diabetes mellitus without complication ([place])    diagnosed 4-5 yrs ago  . Disease of pancreas   . Diverticulitis    s/p perforation and partial colectomy 01/27/14 with 3 benign lymph nodes   . Diverticulosis 01/15/2017  . DJD (degenerative joint disease)   . DM (diabetes mellitus), type 2 with renal complications ([place]) 1/61/0960  . Fatty liver   . Hypertension   . Hypothyroidism   . Hypothyroidism    ? etiology autoimmune or acquired   . Kidney stone   . Lethargy 02/28/2017  . Obesity, Class III, BMI 40-49.9 (morbid obesity) ([place]) 02/27/2017  . Pleural lipoma   . Postoperative wound infection 04/02/2017  . Spinal stenosis of lumbar region 01/15/2017  . Status post lumbar surgery 02/24/2017  . Ventral hernia   . Wound healing, delayed    back      Past Surgical History:  Procedure Laterality Date  . ABDOMINAL EXPOSURE N/A 02/24/2017   Procedure: ABDOMINAL EXPOSURE;  Surgeon: Angelia Mould, MD;  Location: [place];  Service: Vascular;  Laterality: N/A;  . ABDOMINAL HYSTERECTOMY  1987   no h/o abnormal paps   . ANTERIOR LAT LUMBAR FUSION N/A 02/24/2017   Procedure: Lumbar three- five Anterior lateral lumbar interbody fusion;  Surgeon: Ditty, Kevan Ny, MD;  Location: [place];  Service: Neurosurgery;  Laterality: N/A;  L3-5 Anterior lateral lumbar interbody fusion with removal of coflex at  L3-4, L4-5  . ANTERIOR LUMBAR FUSION N/A 02/24/2017   Procedure: Stage 1: Lumbar five-Sacral one Anterior lumbar interbody fusion;  Surgeon: Ditty, Kevan Ny, MD;  Location: [place];  Service: Neurosurgery;  Laterality: N/A;  Stage 1: L5-S1 Anterior lumbar interbody fusion  . APPENDECTOMY    . APPLICATION OF ROBOTIC ASSISTANCE FOR SPINAL PROCEDURE N/A 02/26/2017   Procedure: APPLICATION OF ROBOTIC ASSISTANCE FOR SPINAL PROCEDURE;  Surgeon: Ditty, Kevan Ny, MD;  Location: [place];  Service: Neurosurgery;  Laterality: N/A;  . APPLICATION OF WOUND VAC N/A 03/30/2017   Procedure: APPLICATION OF WOUND VAC;  Surgeon: Ditty, Kevan Ny, MD;  Location: [place];  Service: Neurosurgery;  Laterality: N/A;  . BACK SURGERY  2013  . BREAST SURGERY  1992   Breast Reduction  . CHOLECYSTECTOMY  1982  . COLON SURGERY  01/26/2014   desc.sigmoid colectomy and ventral hernia repair and splenic flexure mobilization  . COLON SURGERY    . EYE SURGERY Right 2017   cataracts  . [place]  . LUMBAR FUSION  02/24/2017  . LUMBAR LAMINECTOMY WITH SPINOUS PROCESS PLATE 2 LEVEL N/A 4/54/0981   Procedure: LUMBAR LAMINECTOMY/DECOMPRESSION MICRODISCECTOMY CoFlex;  Surgeon: Faythe Ghee, MD;  Location: [facility];  Service: Neurosurgery;  Laterality: N/A;  Lumbar three-four,Lumbar Four-Five Laminectomy with Coflex  . LUMBAR WOUND DEBRIDEMENT N/A 03/30/2017   Procedure: Lumbar wound exploration/debridement, placement of wound vac;  Surgeon: Ditty, Kevan Ny, MD;  Location: [place];  Service: Neurosurgery;  Laterality: N/A;  Lumbar wound exploration/debridement, placement of wound vac  . PARTIAL COLECTOMY     01/27/14 diverticulitis and 3 benign lymph nodes [facility] Dr. Pat Patrick   . VENTRAL HERNIA REPAIR        Social History:     Social History   Tobacco Use  . Smoking status: Never Smoker  . Smokeless tobacco: Never Used  Substance Use Topics  . Alcohol use: No  Family History :      Family History  Problem Relation Age of Onset  . Diabetes Father   . Heart attack Father   . Hypertension Father   . Heart disease Father   . Kidney disease Father   . Diabetes Mother   . Hypertension Mother   . Heart disease Mother   . Kidney disease Mother   . Lupus Sister   . Heart disease Brother   . Diabetes Brother   . Heart disease Brother   . Diabetes Brother   . Lupus Sister        Home Medications:   Prior to Admission medications   Medication Sig Start Date End Date Taking? Authorizing Provider  isosorbide mononitrate (IMDUR) 30 MG 24 hr tablet Take 1 tablet (30 mg total) by mouth daily. 06/06/18   Cassandria Santee, MD     Allergies:     Allergies  Allergen Reactions  . Clams [Shellfish Allergy] Swelling and Other (See Comments)    THROAT SWELLS NECK TURNS RED  . Hydralazine Other (See Comments)    CHEST TIGHTNESS  . Norvasc [Amlodipine Besylate] Swelling    Leg edema   . Milk-Related Compounds Diarrhea     Physical Exam:   Vitals  Temperature 98.1 F (36.7 C), temperature source Oral, weight 106 kg.   1. General Pleasant morbidly obese African-American elderly female lying in hospital bed in no distress,  2. Normal affect and insight, Not Suicidal or Homicidal, Awake Alert, Oriented X 3.  3. No F.N deficits, ALL C.Nerves Intact, Strength 5/5 all 3 extremities, left lower extremity strength somewhat reduced as compared to the right side strength is around 4/5, sensation intact all 4 extremities, Plantars down going.  4. Ears and Eyes appear Normal, Conjunctivae clear, PERRLA. Moist Oral Mucosa.  5. Supple Neck, No JVD, No cervical lymphadenopathy appriciated, No Carotid Bruits.  6. Symmetrical Chest wall movement, Good air movement bilaterally, CTAB.  7. RRR, No Gallops, Rubs or Murmurs, No Parasternal Heave.  8. Positive Bowel Sounds, Abdomen Soft, No tenderness, No organomegaly appriciated,No rebound -guarding or rigidity.  9.  No Cyanosis,  Normal Skin Turgor, No Skin Rash or Bruise.  Open wound around the lower back left side, draining large amounts of pus.  No surrounding cellulitis.  10. Good muscle tone,  joints appear normal , no effusions, Normal ROM.  11. No Palpable Lymph Nodes in Neck or Axillae      Data Review:    CBC Recent Labs  Lab 06/04/18 0245 06/04/18 1315 06/05/18 0522  WBC 17.7* 18.8* 15.6*  HGB 7.7* 6.8* 7.0*  HCT 24.8* 21.3* 21.9*  PLT 456* 355 367  MCV 84.6 81.3 83.6  MCH 26.3 26.0 26.7  MCHC 31.0 31.9 32.0  RDW 15.9* 15.9* 16.1*  LYMPHSABS 1.2  --   --   MONOABS 0.6  --   --   EOSABS 0.1  --   --   BASOSABS 0.0  --   --    ------------------------------------------------------------------------------------------------------------------  Chemistries  Recent Labs  Lab 06/04/18 0245 06/04/18 1315 06/05/18 0522  NA 138 137 138  K 3.9 4.3 3.9  CL 104 104 104  CO2 25 24 25   GLUCOSE 114* 199* 115*  BUN 58* 51* 52*  CREATININE 2.45* 2.36* 2.23*  CALCIUM 8.7* 8.3* 8.1*  AST 17  --   --   ALT 12  --   --   ALKPHOS 75  --   --   BILITOT 0.6  --   --    ------------------------------------------------------------------------------------------------------------------  estimated creatinine clearance is 24.9 mL/min (A) (by C-G formula based on SCr of 2.23 mg/dL (H)). ------------------------------------------------------------------------------------------------------------------ Recent Labs    06/04/18 0636  TSH 2.155  T3FREE 1.3*    Coagulation profile Recent Labs  Lab 06/04/18 0636  INR 1.38   ------------------------------------------------------------------------------------------------------------------- No results for input(s): DDIMER in the last 72 hours. -------------------------------------------------------------------------------------------------------------------  Cardiac Enzymes Recent Labs  Lab 06/04/18 0636 06/04/18 1315 06/04/18 2316  TROPONINI 0.03*  0.07* 0.03*   ------------------------------------------------------------------------------------------------------------------    Component Value Date/Time   BNP 1,059.0 (H) 06/04/2018 0245     ---------------------------------------------------------------------------------------------------------------  Urinalysis    Component Value Date/Time   COLORURINE YELLOW 07/21/2017 1001   APPEARANCEUR Sl Cloudy (A) 07/21/2017 1001   APPEARANCEUR Cloudy 12/14/2013 1727   LABSPEC >=1.030 (A) 07/21/2017 1001   LABSPEC 1.012 12/14/2013 1727   PHURINE 5.5 07/21/2017 1001   GLUCOSEU NEGATIVE 07/21/2017 1001   HGBUR NEGATIVE 07/21/2017 1001   BILIRUBINUR NEGATIVE 07/21/2017 1001   BILIRUBINUR Negative 12/14/2013 1727   KETONESUR NEGATIVE 07/21/2017 1001   PROTEINUR 30 (A) 03/29/2017 2254   UROBILINOGEN 0.2 07/21/2017 1001   NITRITE NEGATIVE 07/21/2017 1001   LEUKOCYTESUR NEGATIVE 07/21/2017 1001   LEUKOCYTESUR 3+ 12/14/2013 1727    ----------------------------------------------------------------------------------------------------------------   Imaging Results:      Mr Lumbar Spine Wo Contrast  Result Date: 06/04/2018 CLINICAL DATA:  Fluid leaking from spinal wound incision, history of multiple lumbar spine surgeries. Status post lumbar spine wound debridement March 30, 2017. EXAM: MRI LUMBAR SPINE WITHOUT CONTRAST TECHNIQUE: Multiplanar, multisequence MR imaging of the lumbar spine was performed. No intravenous contrast was administered. COMPARISON:  Lumbar spine radiographs June 04, 2018 and MRI lumbar spine March 30, 2018 FINDINGS: Moderately motion degraded examination. SEGMENTATION: For the purposes of this report, the last well-formed intervertebral disc is reported as L5-S1. ALIGNMENT: Maintained lumbar lordosis. Minimal grade 1 L2-3 retrolisthesis. No spondylolysis. VERTEBRAE:Status post L3 through S1 PLIF, susceptibility artifact from hardware. Mild L1-2 disc height loss with  mild disc desiccation. No suspicious or acute bone marrow signal the limited by hardware artifact and patient motion. CONUS MEDULLARIS AND CAUDA EQUINA: Conus medullaris terminates at L1-2 and demonstrates normal morphology and signal characteristics. Limited assessment of cauda equina due to hardware artifact common no nerve root clumping. PARASPINAL AND OTHER SOFT TISSUES: Multiple air-fluid levels within the paraspinal muscles and subcutaneous fat along surgical approach and within surgical bed. Postoperative paraspinal muscle denervation with superimposed RIGHT interstitial STIR signal. DISC LEVELS (limited assessment due to hardware artifact and motion): L1-2: Small broad-based disc bulge, mild facet arthropathy and ligamentum flavum redundancy. No canal stenosis. Mild bilateral neural foraminal narrowing. L2-3: Retrolisthesis. Moderate broad-based disc bulge. Moderate canal stenosis. Mild neural foraminal narrowing may be overestimated by hardware artifact. L3-4: PLIF, posterior decompression. No canal stenosis or definite neural foraminal narrowing. L4-5: PLIF and posterior decompression. No canal stenosis. Moderate possible LEFT neural foraminal narrowing, potentially overestimated by hardware artifact. L5-S1: PLIF and posterior decompression. No canal stenosis. Small suspected central disc extrusion, stable from prior examination. No definite neural foraminal narrowing. IMPRESSION: 1. Air-fluid levels within paraspinal muscles and subcutaneous fat along surgical approach and within surgical bed, query recent intervention, alternatively this could reflect infection. Additional paraspinal muscle denervation versus myositis. 2. Status post L3 through S1 PLIF and posterior decompression. Minimal grade 1 L2-3 retrolisthesis. 3. Moderate canal stenosis L2-3. Multilevel suspected neural foraminal narrowing, potentially moderate on the LEFT at L4-5. 4. These results will be called to the ordering clinician or  representative by the  Psychologist, clinical, and communication documented in the PACS or zVision Dashboard. Electronically Signed   By: Elon Alas M.D.   On: 06/04/2018 23:14      Assessment & Plan:      1. Post op L.Spine Abscess - with MRI showing air-fluid levels within the paraspinal muscles and subcutaneous fat along the surgical approach and within the surgical bed, currently does have leukocytosis however no evidence of frank sepsis.  Appears nontoxic.  Will allow pending blood cultures from [place], wound cultures although unreliable so far growing gram-positive cocci and rods, will obtain nasal MRSA PCR.  For now we will place her on IV vancomycin and meropenem.  Have requested ID and neurosurgery to evaluate the patient.  We will continue PT.  Does have some left lower extremity weakness and will monitor that closely though this might be chronic.  2.  Initial hypertension.  In poor control.  Have placed her on Coreg, imdur, Norvasc and PRN Catapres.  Will monitor and adjust.  She has allergy to hydralazine.  Will monitor and adjust as needed.   3.  ARF on CKD 4.  Baseline creatinine is 1.6.  Check renal ultrasound, monitor bladder scans, does appear to have mild fluid overload, BNP is elevated, chest x-ray has evidence of mild fluid overload, gentle IV Lasix and monitor.  4.  Mild acute on chronic diastolic dysfunction grade 2.  EF 55 to 60% on echocardiogram done this month at [facility].  On Lasix at home, place her on IV Lasix along with Coreg and monitor.  5. Hypothyroidism.  Resume home dose Synthroid which appears to be 112 mcg.  6.  Anemia of chronic disease - check anemia panel, we will type screen and transfuse 1 unit in the light of acute renal failure.  No signs of active bleeding, monitor.  7.  DM II.  Check A1c, sliding scale for now.     DVT Prophylaxis Heparin    AM Labs Ordered, also please review Full Orders  Family Communication: Admission, patients  condition and plan of care including tests being ordered have been discussed with the patient  who indicates understanding and agree with the plan and Code Status.  Code Status Full  Likely DC to  SNF  Condition GUARDED    Consults called: ID, N Surg    Admission status: Inpt    Time spent in minutes : 35   Lala Lund M.D on 06/05/2018 at 2:10 PM  To page go to [email or web address] - password [facility]

## 2018-06-05 NOTE — Progress Notes (Signed)
[name], female   DOB: [birth year], 77 y.o.   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  I suspect that this is probably due to the same Proteus infection she had last year but Gram stain of superficial drainage shows gram-positive cocci and gram-positive rods so I agree with broad empiric therapy for now.  I also agree with incision and drainage with operative specimen sent for Gram stain, aerobic and anaerobic cultures.  PLAN: 1. Continue current antibiotics pending surgery and final culture results  Principal Problem:   Postoperative wound infection Active Problems:   Status post lumbar surgery   Infection and inflammatory reaction due to internal fixation device of spine, initial encounter ([place])   HLD (hyperlipidemia)   Obesity, Class III, BMI 40-49.9 (morbid obesity) ([place])   DM (diabetes mellitus), type 2 with renal complications (HCC)   Anemia   Acute renal failure superimposed on stage 3 chronic kidney disease (HCC)   Essential hypertension   Scheduled Meds: . sodium chloride   Intravenous Once  . amLODipine  10 mg Oral Daily  . carvedilol  12.5 mg Oral BID WC  . furosemide  60 mg Intravenous Once  . heparin  5,000 Units Subcutaneous Q8H  . isosorbide mononitrate  30 mg Oral Daily  . [START ON 06/06/2018] levothyroxine  112 mcg Oral Q0600  . sodium chloride flush  3 mL Intravenous Q12H   Continuous Infusions: . meropenem (MERREM) IV    . vancomycin     PRN Meds:.cloNIDine, diphenhydrAMINE, HYDROcodone-acetaminophen, metoprolol tartrate, morphine injection   SUBJECTIVE: Taylor Tyler is a 77 year old who underwent redo spinal fusion from L3-S1 on 02/26/2017. She developed wound dehiscence and confusion and was readmitted on 03/29/2017. She was taken back to the OR on  03/30/2017. Cloudy fluid was encountered beneath the fascia.  Deep tissue specimen submitted for culture grew Proteus mirabilis.  She received 6 weeks of oral levofloxacin completing therapy on 05/21/2017.  Her VAC wound dressing was removed in February.  She was admitted to [facility] yesterday with severe lower back, left hip and left buttock pain.  She has also been having drainage from a chronic sinus tract over her lumbar spine.  MRI revealed multiple abscesses overlying the operative site.  She was started on broad empiric antibiotic therapy and transferred here today for neurosurgical evaluation.  She has difficulty telling me when she started having more pain and when the incision opened up and started to drain again but it sounds like it is been at least a several months.  Review of Systems: Review of Systems  Constitutional: Positive for malaise/fatigue. Negative for chills, diaphoresis and fever.  Gastrointestinal: Negative for abdominal pain, diarrhea, nausea and vomiting.  Musculoskeletal: Positive for back pain and joint pain.    Allergies  Allergen Reactions  . Clams [Shellfish Allergy] Swelling and Other (See Comments)    THROAT SWELLS NECK TURNS RED  . Hydralazine Other (See Comments)    CHEST TIGHTNESS  . Norvasc [Amlodipine Besylate] Swelling    Leg edema   . Milk-Related Compounds Diarrhea    OBJECTIVE: Vitals:   06/05/18 1300 06/05/18 1346 06/05/18 1500 06/05/18 1621  BP:  (!) 189/96  (!) 187/93  Pulse:  77 65   Resp:    18  Temp: 98.1 F (36.7 C)   98 F (36.7 C)  TempSrc: Oral   Oral  SpO2:  99% 97%   Weight: 106 kg      Body mass index is 42.74 kg/m.  Physical Exam  Constitutional:  She is resting quietly in bed.  She appears moderately uncomfortable due to pain.  Abdominal:  She has marked swelling and tenderness of her left buttock and purulent drainage from a midline sinus.    Lab Results Lab Results  Component Value Date     WBC 15.9 (H) 06/05/2018   HGB 7.0 (L) 06/05/2018   HCT 22.3 (L) 06/05/2018   MCV 83.5 06/05/2018   PLT 383 06/05/2018    Lab Results  Component Value Date   CREATININE 2.19 (H) 06/05/2018   BUN 46 (H) 06/05/2018   NA 136 06/05/2018   K 3.6 06/05/2018   CL 105 06/05/2018   CO2 25 06/05/2018    Lab Results  Component Value Date   ALT 11 06/05/2018   AST 15 06/05/2018   ALKPHOS 58 06/05/2018   BILITOT 0.6 06/05/2018     Microbiology: Recent Results (from the past 240 hour(s))  Aerobic/Anaerobic Culture (surgical/deep wound)     Status: None (Preliminary result)   Collection Time: 06/04/18  6:11 AM  Result Value Ref Range Status   Specimen Description   Final    BACK Performed at [facility], [address] [place], [place] [address]    Special Requests   Final    Normal Performed at [facility], [address] [place], [place] [address]    Gram Stain   Final    RARE WBC PRESENT, PREDOMINANTLY PMN RARE GRAM POSITIVE RODS RARE GRAM POSITIVE COCCI    Culture   Final    CULTURE REINCUBATED FOR BETTER GROWTH Performed at [facility], [place] [address] [place], [place] [address]    Report Status PENDING  Incomplete  MRSA PCR Screening     Status: None   Collection Time: 06/04/18  6:13 AM  Result Value Ref Range Status   MRSA by PCR NEGATIVE NEGATIVE Final    Comment:        The GeneXpert MRSA Assay (FDA approved for NASAL specimens only), is one component of a comprehensive MRSA colonization surveillance program. It is not intended to diagnose MRSA infection nor to guide or monitor treatment for MRSA infections. Performed at [facility], [address] [place], [place] [address]   Culture, blood (Routine X 2) w Reflex to ID Panel     Status: None (Preliminary result)   Collection Time: 06/04/18 11:15 PM  Result Value Ref Range Status   Specimen Description BLOOD RIGHT HAND  Final   Special Requests   Final     BOTTLES DRAWN AEROBIC AND ANAEROBIC Blood Culture adequate volume   Culture   Final    NO GROWTH < 12 HOURS Performed at [facility], [address], [place],  [address]    Report Status PENDING  Incomplete  Culture, blood (Routine X 2) w Reflex to ID Panel     Status: None (Preliminary result)   Collection Time: 06/04/18 11:17 PM  Result Value Ref Range Status   Specimen Description BLOOD LEFT ANTECUBITAL  Final   Special Requests   Final    BOTTLES DRAWN AEROBIC AND ANAEROBIC Blood Culture adequate volume   Culture  Final    NO GROWTH < 12 HOURS Performed at [facility], [address] [place], [place] [address]    Report Status PENDING  Incomplete    Michel Bickers, MD [facility] [place] [phone] pager   [phone] cell 06/05/2018, 4:34 PM

## 2018-06-05 NOTE — Progress Notes (Signed)
[facility] here at this time to transport patient to [facility].  Report given to [place], RN on [place].  Patient vitals stable at this time.

## 2018-06-05 NOTE — Anesthesia Preprocedure Evaluation (Addendum)
Anesthesia Evaluation  Patient identified by MRN, date of birth, ID band Patient awake    Reviewed: Allergy & Precautions, H&P , NPO status , Patient's Chart, lab work & pertinent test results, reviewed documented beta blocker date and time   History of Anesthesia Complications Negative for: history of anesthetic complications  Airway Mallampati: II  TM Distance: >3 FB Neck ROM: Full    Dental no notable dental hx. (+) Dental Advisory Given   Pulmonary neg pulmonary ROS,    Pulmonary exam normal        Cardiovascular Exercise Tolerance: Good hypertension, Pt. on medications and Pt. on home beta blockers Normal cardiovascular exam  TTE 01/2017 - Left ventricle: The cavity size was normal. Wall thickness was increased in a pattern of moderate LVH. Systolic function was normal. The estimated ejection fraction was in the range of 55% to 60%. Wall motion was normal; there were no regional wall motion abnormalities. - Left atrium: The atrium was mildly dilated. - Atrial septum: No defect or patent foramen ovale was identified.   Neuro/Psych negative neurological ROS  negative psych ROS   GI/Hepatic negative GI ROS, Neg liver ROS,   Endo/Other  diabetes, Type 2, Oral Hypoglycemic AgentsHypothyroidism Morbid obesity  Renal/GU CRFRenal disease     Musculoskeletal  (+) Arthritis , Osteoarthritis,    Abdominal   Peds  Hematology  (+) anemia ,   Anesthesia Other Findings   Reproductive/Obstetrics                                                             Anesthesia Evaluation  Patient identified by MRN, date of birth, ID band Patient awake    Reviewed: Allergy & Precautions, H&P , NPO status , Patient's Chart, lab work & pertinent test results  Airway Mallampati: III  TM Distance: >3 FB Neck ROM: Full    Dental no notable dental hx. (+) Teeth Intact, Dental Advisory Given    Pulmonary neg pulmonary ROS,    Pulmonary exam normal breath sounds clear to auscultation       Cardiovascular Exercise Tolerance: Good hypertension, Pt. on medications and Pt. on home beta blockers  Rhythm:Regular Rate:Normal  TTE 01/2017 - Left ventricle: The cavity size was normal. Wall thickness was increased in a pattern of moderate LVH. Systolic function was normal. The estimated ejection fraction was in the range of 55% to 60%. Wall motion was normal; there were no regional wall motion abnormalities. - Left atrium: The atrium was mildly dilated. - Atrial septum: No defect or patent foramen ovale was identified.   Neuro/Psych negative neurological ROS  negative psych ROS   GI/Hepatic negative GI ROS, Neg liver ROS,   Endo/Other  diabetes, Type 2, Oral Hypoglycemic AgentsHypothyroidism Morbid obesity  Renal/GU CRFRenal disease  negative genitourinary   Musculoskeletal  (+) Arthritis , Osteoarthritis,    Abdominal   Peds  Hematology  (+) anemia ,   Anesthesia Other Findings   Reproductive/Obstetrics negative OB ROS                             Anesthesia Physical  Anesthesia Plan  ASA: III  Anesthesia Plan: General   Post-op Pain Management:    Induction: Intravenous  PONV Risk Score and Plan:  4 or greater and Ondansetron, Dexamethasone, Midazolam and Treatment may vary due to age or medical condition  Airway Management Planned: Oral ETT  Additional Equipment:   Intra-op Plan:   Post-operative Plan: Extubation in OR  Informed Consent: I have reviewed the patients History and Physical, chart, labs and discussed the procedure including the risks, benefits and alternatives for the proposed anesthesia with the patient or authorized representative who has indicated his/her understanding and acceptance.   Dental advisory given  Plan Discussed with: CRNA  Anesthesia Plan Comments:         Anesthesia Quick  Evaluation  Anesthesia Physical  Anesthesia Plan  ASA: III  Anesthesia Plan: General   Post-op Pain Management:    Induction: Intravenous  PONV Risk Score and Plan: 4 or greater and Ondansetron, Dexamethasone, Treatment may vary due to age or medical condition and Diphenhydramine  Airway Management Planned: Oral ETT  Additional Equipment: None  Intra-op Plan:   Post-operative Plan: Extubation in OR  Informed Consent: I have reviewed the patients History and Physical, chart, labs and discussed the procedure including the risks, benefits and alternatives for the proposed anesthesia with the patient or authorized representative who has indicated his/her understanding and acceptance.   Dental advisory given  Plan Discussed with: CRNA, Anesthesiologist and Surgeon  Anesthesia Plan Comments:        Anesthesia Quick Evaluation

## 2018-06-05 NOTE — Consult Note (Addendum)
Chief Complaint   Low back pain, wound infection  HPI   HPI: Taylor Tyler is a 77 y.o. female with long standing history of chronic lumbar radiculopathy. Briefly, She underwent a two stage procedure in July 2018 by Dr Marland Kitchen Ditty. Stage 1: 02/24/2017 L5-S1 ALIF, L3-5 XLIF followed by Stage 2: 02/26/2017 L3-4 L5-S1 laminectomy with facetectomies, L4-5 laminectomy with gill procedure, L3-S1 dorsal internal fixation. She has an uncomplicated post operative course and was discharged on 03/03/2017 to SNF. Unfortunately she returned to outpt clinic shortly after due to wound dehiscence and was placed on wet/dry dressing changes with packing. She was brought to ER on 03/29/17 due to altered mental status which was multifactorial (AKI, meds, ?wound injfectionetc). Due to persistent open wound and concern for infection, she underwent wound exploration/debridement and placement of wound vac on 03/30/17. While in the OR, cultures were obtained and grew Proteus mirabilis so patient was placed on levaquin for 6 weeks and d/c back to SNF on 04/07/17. She returned back to [facility] ER yesterday due to worsening back pain and persistent drainage from lumbar wound. Admitted to medicine due to HTN, AKI and wound infection  Taylor Tyler reports since back surgery, her pain has been gradually worsening, particularly over the last 3-4 months. Her affects midline lower back. No N/T in extremities. Has not noticed any LE weakness but states "I'm 77, I don't know". She has also been having drainage from her lumbar wound "since surgery". No associated fevers, chills, nausea, vomiting.   Patient Active Problem List   Diagnosis Date Noted  . Infection and inflammatory reaction due to internal fixation device of spine, initial encounter ([place]) 06/05/2018  . Hypertensive emergency 06/04/2018  . Upper respiratory tract infection 04/19/2018  . Prediabetes 04/19/2018  . Neuropathy 01/08/2018  . Fatty liver 01/05/2018  . Chronic pain of  both knees 01/05/2018  . Ventral hernia 01/05/2018  . Bradycardia 09/07/2017  . Hypomagnesemia 08/09/2017  . CKD (chronic kidney disease) stage 4, GFR 15-29 ml/min (HCC) 07/21/2017  . Edema 07/21/2017  . Vitamin D deficiency 07/21/2017  . Essential hypertension   . Altered consciousness   . Postoperative wound infection 04/02/2017  . Urinary retention   . Wound dehiscence   . Diarrhea   . Altered mental status 03/29/2017  . Anemia 03/14/2017  . Acute blood loss anemia 03/14/2017  . Acute renal failure superimposed on stage 3 chronic kidney disease ([place]) 03/14/2017  . Bilateral lower extremity edema 03/04/2017  . AKI (acute kidney injury) ([place]) 02/28/2017  . Obesity, Class III, BMI 40-49.9 (morbid obesity) ([place]) 02/27/2017  . DM (diabetes mellitus), type 2 with renal complications ([place]) 38/75/6433  . Status post lumbar surgery 02/24/2017  . CKD (chronic kidney disease) stage 3, GFR 30-59 ml/min (HCC) 01/15/2017  . Hypothyroidism 01/15/2017  . Diverticulosis 01/15/2017  . HLD (hyperlipidemia) 01/15/2017  . Spinal stenosis of lumbar region 01/15/2017  . Disease of pancreas 07/21/2012    PMH: Past Medical History:  Diagnosis Date  . Acute blood loss anemia 03/14/2017  . Acute renal failure superimposed on stage 3 chronic kidney disease ([place]) 03/14/2017  . AKI (acute kidney injury) ([place]) 02/28/2017  . Altered mental status 03/29/2017  . Anemia   . Aortic atherosclerosis ([place])   . Arthritis   . Bilateral lower extremity edema 03/04/2017  . Bradycardia   . Cataract   . Chronic kidney disease    ?? renal insufficiency,   . CKD (chronic kidney disease) stage 3, GFR 30-59 ml/min (  [place]) 01/15/2017   ?? renal insufficiency, which she thinks is coming from "all these medications"  . Diabetes mellitus without complication ([place])    diagnosed 4-5 yrs ago  . Disease of pancreas   . Diverticulitis    s/p perforation and partial colectomy 01/27/14 with 3 benign lymph nodes   . Diverticulosis  01/15/2017  . DJD (degenerative joint disease)   . DM (diabetes mellitus), type 2 with renal complications ([place]) 7/32/2025  . Fatty liver   . Hypertension   . Hypothyroidism   . Hypothyroidism    ? etiology autoimmune or acquired   . Kidney stone   . Lethargy 02/28/2017  . Obesity, Class III, BMI 40-49.9 (morbid obesity) ([place]) 02/27/2017  . Pleural lipoma   . Postoperative wound infection 04/02/2017  . Spinal stenosis of lumbar region 01/15/2017  . Status post lumbar surgery 02/24/2017  . Ventral hernia   . Wound healing, delayed    back    PSH: Past Surgical History:  Procedure Laterality Date  . ABDOMINAL EXPOSURE N/A 02/24/2017   Procedure: ABDOMINAL EXPOSURE;  Surgeon: Angelia Mould, MD;  Location: [place];  Service: Vascular;  Laterality: N/A;  . ABDOMINAL HYSTERECTOMY  1987   no h/o abnormal paps   . ANTERIOR LAT LUMBAR FUSION N/A 02/24/2017   Procedure: Lumbar three- five Anterior lateral lumbar interbody fusion;  Surgeon: Ditty, Kevan Ny, MD;  Location: [place];  Service: Neurosurgery;  Laterality: N/A;  L3-5 Anterior lateral lumbar interbody fusion with removal of coflex at L3-4, L4-5  . ANTERIOR LUMBAR FUSION N/A 02/24/2017   Procedure: Stage 1: Lumbar five-Sacral one Anterior lumbar interbody fusion;  Surgeon: Ditty, Kevan Ny, MD;  Location: [place];  Service: Neurosurgery;  Laterality: N/A;  Stage 1: L5-S1 Anterior lumbar interbody fusion  . APPENDECTOMY    . APPLICATION OF ROBOTIC ASSISTANCE FOR SPINAL PROCEDURE N/A 02/26/2017   Procedure: APPLICATION OF ROBOTIC ASSISTANCE FOR SPINAL PROCEDURE;  Surgeon: Ditty, Kevan Ny, MD;  Location: [place];  Service: Neurosurgery;  Laterality: N/A;  . APPLICATION OF WOUND VAC N/A 03/30/2017   Procedure: APPLICATION OF WOUND VAC;  Surgeon: Ditty, Kevan Ny, MD;  Location: [place];  Service: Neurosurgery;  Laterality: N/A;  . BACK SURGERY  2013  . BREAST SURGERY  1992   Breast Reduction  . CHOLECYSTECTOMY  1982  .  COLON SURGERY  01/26/2014   desc.sigmoid colectomy and ventral hernia repair and splenic flexure mobilization  . COLON SURGERY    . EYE SURGERY Right 2017   cataracts  . [name]  . LUMBAR FUSION  02/24/2017  . LUMBAR LAMINECTOMY WITH SPINOUS PROCESS PLATE 2 LEVEL N/A 11/28/621   Procedure: LUMBAR LAMINECTOMY/DECOMPRESSION MICRODISCECTOMY CoFlex;  Surgeon: Faythe Ghee, MD;  [facility];  Service: Neurosurgery;  Laterality: N/A;  Lumbar three-four,Lumbar Four-Five Laminectomy with Coflex  . LUMBAR WOUND DEBRIDEMENT N/A 03/30/2017   Procedure: Lumbar wound exploration/debridement, placement of wound vac;  Surgeon: Ditty, Kevan Ny, MD;  Location: [place];  Service: Neurosurgery;  Laterality: N/A;  Lumbar wound exploration/debridement, placement of wound vac  . PARTIAL COLECTOMY     01/27/14 diverticulitis and 3 benign lymph nodes [facility] Dr. Pat Patrick   . VENTRAL HERNIA REPAIR      Medications Prior to Admission  Medication Sig Dispense Refill Last Dose  . [START ON 06/06/2018] isosorbide mononitrate (IMDUR) 30 MG 24 hr tablet Take 1 tablet (30 mg total) by mouth daily. 1 tablet 0     SH:  Social History   Tobacco Use  . Smoking status: Never Smoker  . Smokeless tobacco: Never Used  Substance Use Topics  . Alcohol use: No  . Drug use: No    MEDS: Prior to Admission medications   Medication Sig Start Date End Date Taking? Authorizing Provider  isosorbide mononitrate (IMDUR) 30 MG 24 hr tablet Take 1 tablet (30 mg total) by mouth daily. 06/06/18   Cassandria Santee, MD    ALLERGY: Allergies  Allergen Reactions  . Clams [Shellfish Allergy] Swelling and Other (See Comments)    THROAT SWELLS NECK TURNS RED  . Hydralazine Other (See Comments)    CHEST TIGHTNESS  . Norvasc [Amlodipine Besylate] Swelling    Leg edema   . Milk-Related Compounds Diarrhea    Social History   Tobacco Use  . Smoking status: Never Smoker  . Smokeless tobacco: Never Used    Substance Use Topics  . Alcohol use: No     Family History  Problem Relation Age of Onset  . Diabetes Father   . Heart attack Father   . Hypertension Father   . Heart disease Father   . Kidney disease Father   . Diabetes Mother   . Hypertension Mother   . Heart disease Mother   . Kidney disease Mother   . Lupus Sister   . Heart disease Brother   . Diabetes Brother   . Heart disease Brother   . Diabetes Brother   . Lupus Sister      ROS   Review of Systems  Constitutional: Negative for chills and fever.  HENT: Negative.   Eyes: Negative for blurred vision, double vision and photophobia.  Respiratory: Negative.   Cardiovascular: Negative.   Gastrointestinal: Negative for nausea and vomiting.  Musculoskeletal: Positive for back pain and myalgias.  Skin:       + drainage from lumbar wound  Neurological: Negative for dizziness, tingling, tremors, sensory change, speech change, focal weakness, seizures, loss of consciousness, weakness and headaches.    Exam   Vitals:   06/05/18 1300  Temp: 98.1 F (36.7 C)   General appearance: elderly female, resting comfortably, NAD Eyes: No scleral injection Cardiovascular: Regular rate and rhythm without murmurs, rubs, gallops. No edema or variciosities. Distal pulses normal. Pulmonary: Effort normal, non-labored breathing Musculoskeletal:     Muscle tone upper extremities: Normal    Muscle tone lower extremities: Normal    Motor exam: Upper Extremities Deltoid Bicep Tricep Grip  Right 5/5 5/5 5/5 5/5  Left 5/5 5/5 5/5 5/5   Lower Extremity IP Quad PF DF EHL  Right 5/5 5/5 5/5 5/5 5/5  Left 4/5 4/5 4/5 4/5 4/5   Neurological Mental Status:    - Patient is awake, alert, oriented to person, place, month, year, and situation    - Patient is able to give a clear and coherent history.    - No signs of aphasia or neglect Cranial Nerves    - II: Visual Fields are full. PERRL    - III/IV/VI: EOMI without ptosis or  diploplia.     - V: Facial sensation is grossly normal    - VII: Facial movement is symmetric.     - VIII: hearing is intact to voice    - X: Uvula elevates symmetrically    - XI: Shoulder shrug is symmetric.    - XII: tongue is midline without atrophy or fasciculations.  Sensory: Sensation grossly intact to LT Deep Tendon Reflexes    - 2+  and symmetric in the biceps and patellae. Plantars   - Toes are downgoing bilaterally.  Cerebellar    - FNF and HKS are intact bilaterally  Skin Lumbar surgical incision on lower back. Lower aspect with packed opening. Purulent/blood discharge with light palpation to the sides of the wound. Mild warmth surrounding incision. No significant redness.   Results - Imaging/Labs   Results for orders placed or performed during the hospital encounter of 06/04/18 (from the past 48 hour(s))  Comprehensive metabolic panel     Status: Abnormal   Collection Time: 06/04/18  2:45 AM  Result Value Ref Range   Sodium 138 135 - 145 mmol/L   Potassium 3.9 3.5 - 5.1 mmol/L   Chloride 104 98 - 111 mmol/L   CO2 25 22 - 32 mmol/L   Glucose, Bld 114 (H) 70 - 99 mg/dL   BUN 58 (H) 8 - 23 mg/dL   Creatinine, Ser 2.45 (H) 0.44 - 1.00 mg/dL   Calcium 8.7 (L) 8.9 - 10.3 mg/dL   Total Protein 7.3 6.5 - 8.1 g/dL   Albumin 2.3 (L) 3.5 - 5.0 g/dL   AST 17 15 - 41 U/L   ALT 12 0 - 44 U/L   Alkaline Phosphatase 75 38 - 126 U/L   Total Bilirubin 0.6 0.3 - 1.2 mg/dL   GFR calc non Af Amer 18 (L) >60 mL/min   GFR calc Af Amer 21 (L) >60 mL/min    Comment: (NOTE) The eGFR has been calculated using the CKD EPI equation. This calculation has not been validated in all clinical situations. eGFR's persistently <60 mL/min signify possible Chronic Kidney Disease.    Anion gap 9 5 - 15    Comment: Performed at [facility], [address] [place], [place] [address]  Brain natriuretic peptide     Status: Abnormal   Collection Time: 06/04/18  2:45 AM  Result Value Ref  Range   B Natriuretic Peptide 1,059.0 (H) 0.0 - 100.0 pg/mL    Comment: Performed at [facility], [address] [place], [place] [address]  Troponin I     Status: Abnormal   Collection Time: 06/04/18  2:45 AM  Result Value Ref Range   Troponin I 0.03 (HH) <0.03 ng/mL    Comment: CRITICAL RESULT CALLED TO, READ BACK BY AND VERIFIED WITH SHERIE ALLISON _0  06/04/18 FLC Performed at [facility], [address] [place], [place] [address]   CBC with Differential     Status: Abnormal   Collection Time: 06/04/18  2:45 AM  Result Value Ref Range   WBC 17.7 (H) 4.0 - 10.5 K/uL   RBC 2.93 (L) 3.87 - 5.11 MIL/uL   Hemoglobin 7.7 (L) 12.0 - 15.0 g/dL   HCT 24.8 (L) 36.0 - 46.0 %   MCV 84.6 80.0 - 100.0 fL   MCH 26.3 26.0 - 34.0 pg   MCHC 31.0 30.0 - 36.0 g/dL   RDW 15.9 (H) 11.5 - 15.5 %   Platelets 456 (H) 150 - 400 K/uL   nRBC 0.0 0.0 - 0.2 %   Neutrophils Relative % 80 %    Comment: CORRECTED ON 11/01 AT 0424: PREVIOUSLY REPORTED AS 0   Neutro Abs 13.4 (H) 1.7 - 7.7 K/uL    Comment: CORRECTED ON 11/01 AT 0424: PREVIOUSLY REPORTED AS 0.0   Band Neutrophils 7 %   Lymphocytes Relative 8 %    Comment: CORRECTED ON 11/01 AT 0424: PREVIOUSLY REPORTED AS 0   Lymphs  Abs 1.2 0.7 - 4.0 K/uL    Comment: CORRECTED ON 11/01 AT 0424: PREVIOUSLY REPORTED AS 0.0   Monocytes Relative 4 %    Comment: CORRECTED ON 11/01 AT 0424: PREVIOUSLY REPORTED AS 0   Monocytes Absolute 0.6 0.1 - 1.0 K/uL    Comment: CORRECTED ON 11/01 AT 0424: PREVIOUSLY REPORTED AS 0.0   Eosinophils Relative 1 %    Comment: CORRECTED ON 11/01 AT 0424: PREVIOUSLY REPORTED AS 0   Eosinophils Absolute 0.1 0.0 - 0.5 K/uL    Comment: CORRECTED ON 11/01 AT 0424: PREVIOUSLY REPORTED AS 0.0   Basophils Relative 0 %   Basophils Absolute 0.0 0.0 - 0.1 K/uL   WBC Morphology MORPHOLOGY UNREMARKABLE    Smear Review PLATELETS APPEAR ADEQUATE    Dimorphism PRESENT    Polychromasia PRESENT    Target Cells PRESENT      Comment: Performed at [facility], [address] [place], [place] [address]  Glucose, capillary     Status: Abnormal   Collection Time: 06/04/18  5:43 AM  Result Value Ref Range   Glucose-Capillary 105 (H) 70 - 99 mg/dL  Aerobic/Anaerobic Culture (surgical/deep wound)     Status: None (Preliminary result)   Collection Time: 06/04/18  6:11 AM  Result Value Ref Range   Specimen Description      BACK Performed at [facility], [address] [place], [place] [address]    Special Requests      Normal Performed at [facility], [address] [place], [place] [address]    Gram Stain      RARE WBC PRESENT, PREDOMINANTLY PMN RARE GRAM POSITIVE RODS RARE GRAM POSITIVE COCCI Performed at [facility], [place] [address], [place], [place] [address]    Culture PENDING    Report Status PENDING   MRSA PCR Screening     Status: None   Collection Time: 06/04/18  6:13 AM  Result Value Ref Range   MRSA by PCR NEGATIVE NEGATIVE    Comment:        The GeneXpert MRSA Assay (FDA approved for NASAL specimens only), is one component of a comprehensive MRSA colonization surveillance program. It is not intended to diagnose MRSA infection nor to guide or monitor treatment for MRSA infections. Performed at [facility], [address] [place], [place] [address]   TSH     Status: None   Collection Time: 06/04/18  6:36 AM  Result Value Ref Range   TSH 2.155 0.350 - 4.500 uIU/mL    Comment: Performed by a 3rd Generation assay with a functional sensitivity of <=0.01 uIU/mL. Performed at [facility], [address] [place], [place] [address]   Troponin I     Status: Abnormal   Collection Time: 06/04/18  6:36 AM  Result Value Ref Range   Troponin I 0.03 (HH) <0.03 ng/mL    Comment: CRITICAL VALUE NOTED. VALUE IS CONSISTENT WITH PREVIOUSLY REPORTED/CALLED VALUE/HKP Performed at [facility], [address] [place],   [address]   T3, [place]     Status: Abnormal   Collection Time: 06/04/18  6:36 AM  Result Value Ref Range   T3, Free 1.3 (L) 2.0 - 4.4 pg/mL    Comment: (NOTE) Performed At: [facility] [address] [place], [place] [id number] Rush Farmer MD MI:[id number]   T4, free     Status: None   Collection Time: 06/04/18  6:36 AM  Result Value Ref Range   Free  T4 1.12 0.82 - 1.77 ng/dL    Comment: (NOTE) Biotin ingestion may interfere with free T4 tests. If the results are inconsistent with the TSH level, previous test results, or the clinical presentation, then consider biotin interference. If needed, order repeat testing after stopping biotin. Performed at [facility], [address] [place], [place] [address]   Iron and TIBC     Status: Abnormal   Collection Time: 06/04/18  6:36 AM  Result Value Ref Range   Iron 18 (L) 28 - 170 ug/dL   TIBC 177 (L) 250 - 450 ug/dL   Saturation Ratios 10 (L) 10.4 - 31.8 %   UIBC 159 ug/dL    Comment: Performed at [facility], [address] [place], [place] [address]  Ferritin     Status: Abnormal   Collection Time: 06/04/18  6:36 AM  Result Value Ref Range   Ferritin 579 (H) 11 - 307 ng/mL    Comment: Performed at [facility], [address] [place], [place] [address]  Protime-INR     Status: Abnormal   Collection Time: 06/04/18  6:36 AM  Result Value Ref Range   Prothrombin Time 16.8 (H) 11.4 - 15.2 seconds   INR 1.38     Comment: Performed at [facility], [address] [place], [place] [address]  Troponin I     Status: Abnormal   Collection Time: 06/04/18  1:15 PM  Result Value Ref Range   Troponin I 0.07 (HH) <0.03 ng/mL    Comment: CRITICAL VALUE NOTED. VALUE IS CONSISTENT WITH PREVIOUSLY REPORTED/CALLED VALUE / [place] Performed at [facility], [address] [place], [place] [address]   CBC     Status: Abnormal   Collection Time: 06/04/18  1:15 PM  Result Value Ref  Range   WBC 18.8 (H) 4.0 - 10.5 K/uL   RBC 2.62 (L) 3.87 - 5.11 MIL/uL   Hemoglobin 6.8 (L) 12.0 - 15.0 g/dL   HCT 21.3 (L) 36.0 - 46.0 %   MCV 81.3 80.0 - 100.0 fL   MCH 26.0 26.0 - 34.0 pg   MCHC 31.9 30.0 - 36.0 g/dL   RDW 15.9 (H) 11.5 - 15.5 %   Platelets 355 150 - 400 K/uL   nRBC 0.1 0.0 - 0.2 %    Comment: Performed at [facility], [address] [place], [place] [address]  Basic metabolic panel     Status: Abnormal   Collection Time: 06/04/18  1:15 PM  Result Value Ref Range   Sodium 137 135 - 145 mmol/L   Potassium 4.3 3.5 - 5.1 mmol/L   Chloride 104 98 - 111 mmol/L   CO2 24 22 - 32 mmol/L   Glucose, Bld 199 (H) 70 - 99 mg/dL   BUN 51 (H) 8 - 23 mg/dL   Creatinine, Ser 2.36 (H) 0.44 - 1.00 mg/dL   Calcium 8.3 (L) 8.9 - 10.3 mg/dL   GFR calc non Af Amer 19 (L) >60 mL/min   GFR calc Af Amer 22 (L) >60 mL/min    Comment: (NOTE) The eGFR has been calculated using the CKD EPI equation. This calculation has not been validated in all clinical situations. eGFR's persistently <60 mL/min signify possible Chronic Kidney Disease.    Anion gap 9 5 - 15    Comment: Performed at [facility], [address] [place], [place] [address]  ABO/Rh     Status: None   Collection Time: 06/04/18  1:15  PM  Result Value Ref Range   ABO/RH(D)      A POS Performed at [facility], [address] [place], [place] [address]   Type and screen [place]     Status: None   Collection Time: 06/04/18  2:56 PM  Result Value Ref Range   ABO/RH(D) A POS    Antibody Screen NEG    Sample Expiration 06/07/2018    Unit Number [id number]    Blood Component Type RBC, LR IRR    Unit division 00    Status of Unit ISSUED,FINAL    Transfusion Status OK TO TRANSFUSE    Crossmatch Result      Compatible Performed at [facility], [address] [place], [place] [address]   Prepare RBC     Status: None   Collection Time: 06/04/18   2:58 PM  Result Value Ref Range   Order Confirmation      ORDER PROCESSED BY BLOOD BANK Performed at [facility], [address] [place], [place] [address]   Glucose, capillary     Status: Abnormal   Collection Time: 06/04/18  7:56 PM  Result Value Ref Range   Glucose-Capillary 153 (H) 70 - 99 mg/dL  Culture, blood (Routine X 2) w Reflex to ID Panel     Status: None (Preliminary result)   Collection Time: 06/04/18 11:15 PM  Result Value Ref Range   Specimen Description BLOOD RIGHT HAND    Special Requests      BOTTLES DRAWN AEROBIC AND ANAEROBIC Blood Culture adequate volume   Culture      NO GROWTH < 12 HOURS Performed at [facility], [address] [place], [place] [address]    Report Status PENDING   Troponin I     Status: Abnormal   Collection Time: 06/04/18 11:16 PM  Result Value Ref Range   Troponin I 0.03 (HH) <0.03 ng/mL    Comment: CRITICAL VALUE NOTED. VALUE IS CONSISTENT WITH PREVIOUSLY REPORTED/CALLED VALUE / [place] Performed at [facility], [address] [place], [place] [address]   Culture, blood (Routine X 2) w Reflex to ID Panel     Status: None (Preliminary result)   Collection Time: 06/04/18 11:17 PM  Result Value Ref Range   Specimen Description BLOOD LEFT ANTECUBITAL    Special Requests      BOTTLES DRAWN AEROBIC AND ANAEROBIC Blood Culture adequate volume   Culture      NO GROWTH < 12 HOURS Performed at [facility], [address] [place],  [address]    Report Status PENDING   CBC     Status: Abnormal   Collection Time: 06/05/18  5:22 AM  Result Value Ref Range   WBC 15.6 (H) 4.0 - 10.5 K/uL   RBC 2.62 (L) 3.87 - 5.11 MIL/uL   Hemoglobin 7.0 (L) 12.0 - 15.0 g/dL   HCT 21.9 (L) 36.0 - 46.0 %   MCV 83.6 80.0 - 100.0 fL   MCH 26.7 26.0 - 34.0 pg   MCHC 32.0 30.0 - 36.0 g/dL   RDW 16.1 (H) 11.5 - 15.5 %   Platelets 367 150 - 400 K/uL   nRBC 0.1 0.0 - 0.2 %    Comment: Performed at [facility] [place], [address] [place],  [address]  Basic metabolic panel     Status: Abnormal   Collection Time: 06/05/18  5:22 AM  Result Value Ref Range   Sodium 138  135 - 145 mmol/L   Potassium 3.9 3.5 - 5.1 mmol/L   Chloride 104 98 - 111 mmol/L   CO2 25 22 - 32 mmol/L   Glucose, Bld 115 (H) 70 - 99 mg/dL   BUN 52 (H) 8 - 23 mg/dL   Creatinine, Ser 2.23 (H) 0.44 - 1.00 mg/dL   Calcium 8.1 (L) 8.9 - 10.3 mg/dL   GFR calc non Af Amer 20 (L) >60 mL/min   GFR calc Af Amer 24 (L) >60 mL/min    Comment: (NOTE) The eGFR has been calculated using the CKD EPI equation. This calculation has not been validated in all clinical situations. eGFR's persistently <60 mL/min signify possible Chronic Kidney Disease.    Anion gap 9 5 - 15    Comment: Performed at [facility], [address] [place], [place] [address]  Glucose, capillary     Status: None   Collection Time: 06/05/18  7:58 AM  Result Value Ref Range   Glucose-Capillary 92 70 - 99 mg/dL  Glucose, capillary     Status: Abnormal   Collection Time: 06/05/18 12:00 PM  Result Value Ref Range   Glucose-Capillary 105 (H) 70 - 99 mg/dL    Dg Chest 2 View  Result Date: 06/04/2018 CLINICAL DATA:  77 year old female with shortness of breath. EXAM: CHEST - 2 VIEW COMPARISON:  Chest radiograph dated 03/29/2017 FINDINGS: There is moderate cardiomegaly. Minimal vascular congestion. No edema. No focal consolidation, pleural effusion, or pneumothorax. No acute osseous pathology. IMPRESSION: Moderate cardiomegaly with minimal vascular congestion. No edema or focal consolidation. Electronically Signed   By: Anner Crete M.D.   On: 06/04/2018 04:01   Dg Lumbar Spine Complete  Result Date: 06/04/2018 CLINICAL DATA:  77 year old female with chronic lower back pain. EXAM: LUMBAR SPINE - COMPLETE 4+ VIEW COMPARISON:  Lumbar spine MRI dated 03/30/2018 FINDINGS: There is no acute fracture or subluxation of the lumbar spine. There is  osteopenia with multilevel degenerative changes. L3-S1 disc spacers and posterior fixation rod and screws as well as L5-S1 anterior fusion noted. Overall these findings are similar to the radiograph of 03/09/2018. Apparent large pocket of air along the posterior lower lumbar spine at L4-S1 and along the fixation hardware likely represents superimposed bowel. Clinical correlation is recommended. There is atherosclerotic calcification of the aorta. IMPRESSION: 1. No acute fracture or subluxation of the lumbar spine. Stable postoperative changes. 2. A pocket of air over the hardware, likely superimposed bowel. Electronically Signed   By: Anner Crete M.D.   On: 06/04/2018 04:09   Mr Lumbar Spine Wo Contrast  Result Date: 06/04/2018 CLINICAL DATA:  Fluid leaking from spinal wound incision, history of multiple lumbar spine surgeries. Status post lumbar spine wound debridement March 30, 2017. EXAM: MRI LUMBAR SPINE WITHOUT CONTRAST TECHNIQUE: Multiplanar, multisequence MR imaging of the lumbar spine was performed. No intravenous contrast was administered. COMPARISON:  Lumbar spine radiographs June 04, 2018 and MRI lumbar spine March 30, 2018 FINDINGS: Moderately motion degraded examination. SEGMENTATION: For the purposes of this report, the last well-formed intervertebral disc is reported as L5-S1. ALIGNMENT: Maintained lumbar lordosis. Minimal grade 1 L2-3 retrolisthesis. No spondylolysis. VERTEBRAE:Status post L3 through S1 PLIF, susceptibility artifact from hardware. Mild L1-2 disc height loss with mild disc desiccation. No suspicious or acute bone marrow signal the limited by hardware artifact and patient motion. CONUS MEDULLARIS AND CAUDA EQUINA: Conus medullaris terminates at L1-2 and demonstrates normal morphology and signal characteristics. Limited assessment of cauda equina due to hardware artifact  common no nerve root clumping. PARASPINAL AND OTHER SOFT TISSUES: Multiple air-fluid levels within the  paraspinal muscles and subcutaneous fat along surgical approach and within surgical bed. Postoperative paraspinal muscle denervation with superimposed RIGHT interstitial STIR signal. DISC LEVELS (limited assessment due to hardware artifact and motion): L1-2: Small broad-based disc bulge, mild facet arthropathy and ligamentum flavum redundancy. No canal stenosis. Mild bilateral neural foraminal narrowing. L2-3: Retrolisthesis. Moderate broad-based disc bulge. Moderate canal stenosis. Mild neural foraminal narrowing may be overestimated by hardware artifact. L3-4: PLIF, posterior decompression. No canal stenosis or definite neural foraminal narrowing. L4-5: PLIF and posterior decompression. No canal stenosis. Moderate possible LEFT neural foraminal narrowing, potentially overestimated by hardware artifact. L5-S1: PLIF and posterior decompression. No canal stenosis. Small suspected central disc extrusion, stable from prior examination. No definite neural foraminal narrowing. IMPRESSION: 1. Air-fluid levels within paraspinal muscles and subcutaneous fat along surgical approach and within surgical bed, query recent intervention, alternatively this could reflect infection. Additional paraspinal muscle denervation versus myositis. 2. Status post L3 through S1 PLIF and posterior decompression. Minimal grade 1 L2-3 retrolisthesis. 3. Moderate canal stenosis L2-3. Multilevel suspected neural foraminal narrowing, potentially moderate on the LEFT at L4-5. 4. These results will be called to the ordering clinician or representative by the Radiologist Assistant, and communication documented in the PACS or zVision Dashboard. Electronically Signed   By: Elon Alas M.D.   On: 06/04/2018 23:14   US Renal  Result Date: 06/04/2018 CLINICAL DATA:  Acute renal failure EXAM: RENAL / URINARY TRACT ULTRASOUND COMPLETE COMPARISON:  CT 03/29/2017 FINDINGS: Right Kidney: Renal measurements: 9.6 x 5.7 x 5.2 cm = volume: 142 mL.  Diffusely increased echotexture. No mass or hydronephrosis. Left Kidney: Renal measurements: 9.1 x 4.4 x 3.4 cm = volume: 71 mL. Diffusely increased echotexture. No mass or hydronephrosis. Bladder: Not visualized, decompressed with Foley catheter in place. IMPRESSION: Atrophic, echogenic kidneys compatible with chronic medical renal disease. No hydronephrosis. Electronically Signed   By: Rolm Baptise M.D.   On: 06/04/2018 09:48    Impression/Plan   77 y.o. 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 There is no evidence of epidural abscess/discitis/osteomyelitis. Cultures performed at [facility] positive for rare gram positive rods and cocci. She is currently being treated empirically with vanc and meropenem. ID consult pending.   She has mild LLE weakness on exam (difficult to know chronicity of this) but otherwise is neurologically intact.   She will need to undergo lumbar wound exploration for debridement. Risks benefits and alternatives to surgery were discussed. Patient states In own language understanding and wishes to proceed. Orders entered for surgery. Scheduled for tomorrow am. NPO at midnight. Okay to resume normal diet today from NS perspective.

## 2018-06-05 NOTE — Progress Notes (Signed)
Pharmacy Antibiotic Note  Taylor Tyler is a 77 y.o. female admitted on 06/05/2018 with wound infection of spinal surgery post op site.  Pharmacy has been consulted for vancomycin and meropenem dosing. Patient transferred from [facility] where she was receiving vancomycin, cefepime, and metronidazole.   WBCs 15.6 Tmax 100.4, Scr 2.23, CrCL ~61ml/min. Last dose of Vancomycin was 11/1 at 1800.  Plan: Vancomycin 1000mg  Q24H  Meropenem 1000mg  Q12H Monitor renal fxn, cultures, clinical status Monitor vancomycin troughs as necesssary  Weight: 233 lb 11 oz (106 kg)  Temp (24hrs), Avg:99.2 F (37.3 C), Min:98.1 F (36.7 C), Max:100.4 F (38 C)  Recent Labs  Lab 06/04/18 0245 06/04/18 1315 06/05/18 0522  WBC 17.7* 18.8* 15.6*  CREATININE 2.45* 2.36* 2.23*    Estimated Creatinine Clearance: 24.9 mL/min (A) (by C-G formula based on SCr of 2.23 mg/dL (H)).    Allergies  Allergen Reactions  . Clams [Shellfish Allergy] Swelling and Other (See Comments)    THROAT SWELLS NECK TURNS RED  . Hydralazine Other (See Comments)    CHEST TIGHTNESS  . Norvasc [Amlodipine Besylate] Swelling    Leg edema   . Milk-Related Compounds Diarrhea    Antimicrobials this admission: Vanc 11/2 >>  merrem 11/2 >>   Dose adjustments this admission:   Microbiology results: 11/1 BCx: ngtd 11/1 wound cx: pending  MRSA PCR: neg  Thank you for allowing pharmacy to be a part of this patient's care.  Harrietta Guardian, PharmD PGY1 Pharmacy Resident 06/05/2018    2:29 PM

## 2018-06-05 NOTE — Progress Notes (Signed)
Pharmacy Antibiotic Note  Taylor Tyler is a 77 y.o. female admitted on 06/04/2018 with wound infection/hypertensive urgency. Patient had lumbar fusion in 02/2017, which became infected and received debridement in 03/2017. Currently, she has tunneling wound/abscess on lower lumbar spine region. Patiently is AKI on CKD III; baseline serum creatinine from 04/2018 ~1.7. Pharmacy has been consulted for vancomycin and cefepime dosing.  Plan: Continue cefepime 1 g q24h Continue Vancomycin 1000 mg q36h. Level before 4th dose on 11/4 @ 1700. Goal trough 15-20.  Vancomycin Kinetics DW 68kg  Vd 48L kei 0.022 hr-1  T1/2 32 hours  Height: 5\' 2"  (157.5 cm) Weight: 234 lb 5.6 oz (106.3 kg) IBW/kg (Calculated) : 50.1  Temp (24hrs), Avg:99.5 F (37.5 C), Min:98.7 F (37.1 C), Max:100.4 F (38 C)  Recent Labs  Lab 06/04/18 0245 06/04/18 1315 06/05/18 0522  WBC 17.7* 18.8* 15.6*  CREATININE 2.45* 2.36* 2.23*    Estimated Creatinine Clearance: 25 mL/min (A) (by C-G formula based on SCr of 2.23 mg/dL (H)).    Allergies  Allergen Reactions  . Clams [Shellfish Allergy] Swelling and Other (See Comments)    THROAT SWELLS NECK TURNS RED  . Hydralazine Other (See Comments)    CHEST TIGHTNESS  . Norvasc [Amlodipine Besylate] Swelling    Leg edema   . Milk-Related Compounds Diarrhea    Antimicrobials this admission: 11/1 Vancomycin >> 11/1 Cefepime >>  Dose adjustments this admission: N/A  Microbiology results: 11/1 MRSA PCR: (-) 11/1 Wound Cx pending  Thank you for allowing pharmacy to be a part of this patient's care.  Pernell Dupre, PharmD, BCPS Clinical Pharmacist 06/05/2018 11:19 AM

## 2018-06-05 NOTE — Progress Notes (Signed)
Taylor Tyler at [place] NAME: Taylor Tyler    MR#:  [id number]  DATE OF BIRTH:  1941/02/22  SUBJECTIVE:  CHIEF COMPLAINT:   Chief Complaint  Patient presents with  . Back Pain   -Blood pressure remains elevated.  Significant swelling and tenderness of the left gluteal area. -MRI confirming myositis and possible tracking of infection into the spine -Awaiting transfer to [facility] for neurosurgery referral  REVIEW OF SYSTEMS:  Review of Systems  Constitutional: Positive for malaise/fatigue. Negative for chills and fever.  HENT: Negative for congestion, hearing loss and nosebleeds.   Eyes: Negative for blurred vision and double vision.  Respiratory: Negative for cough, shortness of breath and wheezing.   Cardiovascular: Negative for chest pain and palpitations.  Gastrointestinal: Negative for abdominal pain, constipation, diarrhea, nausea and vomiting.  Genitourinary: Negative for dysuria.  Musculoskeletal: Positive for back pain and myalgias.  Neurological: Negative for dizziness, focal weakness, seizures, weakness and headaches.  Psychiatric/Behavioral: Negative for depression.    DRUG ALLERGIES:   Allergies  Allergen Reactions  . Clams [Shellfish Allergy] Swelling and Other (See Comments)    THROAT SWELLS NECK TURNS RED  . Hydralazine Other (See Comments)    CHEST TIGHTNESS  . Norvasc [Amlodipine Besylate] Swelling    Leg edema   . Milk-Related Compounds Diarrhea    VITALS:  Blood pressure (!) 160/61, pulse 65, temperature 98.7 F (37.1 C), temperature source Oral, resp. rate 15, height 5\' 2"  (1.575 m), weight 106.3 kg, SpO2 97 %.  PHYSICAL EXAMINATION:  Physical Exam   GENERAL:  77 y.o.-year-old obese patient lying in the bed with no acute distress.  EYES: Pupils equal, round, reactive to light and accommodation. No scleral icterus. Extraocular muscles intact.  HEENT: Head atraumatic, normocephalic. Oropharynx and  nasopharynx clear.  NECK:  Supple, no jugular venous distention. No thyroid enlargement, no tenderness.  LUNGS: Normal breath sounds bilaterally, no wheezing, rales,rhonchi or crepitation. No use of accessory muscles of respiration. Decreased at the bases CARDIOVASCULAR: S1, S2 normal. No murmurs, rubs, or gallops.  ABDOMEN: Soft, nontender, nondistended. Bowel sounds present. No organomegaly or mass.  EXTREMITIES: Significant edema of her thighs especially worse in the left groin and hip region.  No pedal edema, cyanosis, or clubbing.  Oozing wound in lower back NEUROLOGIC: Cranial nerves II through XII are intact. Muscle strength 5/5 in all extremities. Sensation intact. Gait not checked.  Global weakness noted. PSYCHIATRIC: The patient is alert and oriented x 3.  SKIN: No obvious rash, lesion, or ulcer.    LABORATORY PANEL:   CBC Recent Labs  Lab 06/05/18 0522  WBC 15.6*  HGB 7.0*  HCT 21.9*  PLT 367   ------------------------------------------------------------------------------------------------------------------  Chemistries  Recent Labs  Lab 06/04/18 0245  06/05/18 0522  NA 138   < > 138  K 3.9   < > 3.9  CL 104   < > 104  CO2 25   < > 25  GLUCOSE 114*   < > 115*  BUN 58*   < > 52*  CREATININE 2.45*   < > 2.23*  CALCIUM 8.7*   < > 8.1*  AST 17  --   --   ALT 12  --   --   ALKPHOS 75  --   --   BILITOT 0.6  --   --    < > = values in this interval not displayed.   ------------------------------------------------------------------------------------------------------------------  Cardiac Enzymes Recent Labs  Lab  06/04/18 2316  TROPONINI 0.03*   ------------------------------------------------------------------------------------------------------------------  RADIOLOGY:  Dg Chest 2 View  Result Date: 06/04/2018 CLINICAL DATA:  77 year old female with shortness of breath. EXAM: CHEST - 2 VIEW COMPARISON:  Chest radiograph dated 03/29/2017 FINDINGS: There is  moderate cardiomegaly. Minimal vascular congestion. No edema. No focal consolidation, pleural effusion, or pneumothorax. No acute osseous pathology. IMPRESSION: Moderate cardiomegaly with minimal vascular congestion. No edema or focal consolidation. Electronically Signed   By: Anner Crete M.D.   On: 06/04/2018 04:01   Dg Lumbar Spine Complete  Result Date: 06/04/2018 CLINICAL DATA:  77 year old female with chronic lower back pain. EXAM: LUMBAR SPINE - COMPLETE 4+ VIEW COMPARISON:  Lumbar spine MRI dated 03/30/2018 FINDINGS: There is no acute fracture or subluxation of the lumbar spine. There is osteopenia with multilevel degenerative changes. L3-S1 disc spacers and posterior fixation rod and screws as well as L5-S1 anterior fusion noted. Overall these findings are similar to the radiograph of 03/09/2018. Apparent large pocket of air along the posterior lower lumbar spine at L4-S1 and along the fixation hardware likely represents superimposed bowel. Clinical correlation is recommended. There is atherosclerotic calcification of the aorta. IMPRESSION: 1. No acute fracture or subluxation of the lumbar spine. Stable postoperative changes. 2. A pocket of air over the hardware, likely superimposed bowel. Electronically Signed   By: Anner Crete M.D.   On: 06/04/2018 04:09   Mr Lumbar Spine Wo Contrast  Result Date: 06/04/2018 CLINICAL DATA:  Fluid leaking from spinal wound incision, history of multiple lumbar spine surgeries. Status post lumbar spine wound debridement March 30, 2017. EXAM: MRI LUMBAR SPINE WITHOUT CONTRAST TECHNIQUE: Multiplanar, multisequence MR imaging of the lumbar spine was performed. No intravenous contrast was administered. COMPARISON:  Lumbar spine radiographs June 04, 2018 and MRI lumbar spine March 30, 2018 FINDINGS: Moderately motion degraded examination. SEGMENTATION: For the purposes of this report, the last well-formed intervertebral disc is reported as L5-S1. ALIGNMENT:  Maintained lumbar lordosis. Minimal grade 1 L2-3 retrolisthesis. No spondylolysis. VERTEBRAE:Status post L3 through S1 PLIF, susceptibility artifact from hardware. Mild L1-2 disc height loss with mild disc desiccation. No suspicious or acute bone marrow signal the limited by hardware artifact and patient motion. CONUS MEDULLARIS AND CAUDA EQUINA: Conus medullaris terminates at L1-2 and demonstrates normal morphology and signal characteristics. Limited assessment of cauda equina due to hardware artifact common no nerve root clumping. PARASPINAL AND OTHER SOFT TISSUES: Multiple air-fluid levels within the paraspinal muscles and subcutaneous fat along surgical approach and within surgical bed. Postoperative paraspinal muscle denervation with superimposed RIGHT interstitial STIR signal. DISC LEVELS (limited assessment due to hardware artifact and motion): L1-2: Small broad-based disc bulge, mild facet arthropathy and ligamentum flavum redundancy. No canal stenosis. Mild bilateral neural foraminal narrowing. L2-3: Retrolisthesis. Moderate broad-based disc bulge. Moderate canal stenosis. Mild neural foraminal narrowing may be overestimated by hardware artifact. L3-4: PLIF, posterior decompression. No canal stenosis or definite neural foraminal narrowing. L4-5: PLIF and posterior decompression. No canal stenosis. Moderate possible LEFT neural foraminal narrowing, potentially overestimated by hardware artifact. L5-S1: PLIF and posterior decompression. No canal stenosis. Small suspected central disc extrusion, stable from prior examination. No definite neural foraminal narrowing. IMPRESSION: 1. Air-fluid levels within paraspinal muscles and subcutaneous fat along surgical approach and within surgical bed, query recent intervention, alternatively this could reflect infection. Additional paraspinal muscle denervation versus myositis. 2. Status post L3 through S1 PLIF and posterior decompression. Minimal grade 1 L2-3  retrolisthesis. 3. Moderate canal stenosis L2-3. Multilevel suspected neural foraminal narrowing, potentially  moderate on the LEFT at L4-5. 4. These results will be called to the ordering clinician or representative by the Radiologist Assistant, and communication documented in the PACS or zVision Dashboard. Electronically Signed   By: Elon Alas M.D.   On: 06/04/2018 23:14   US Renal  Result Date: 06/04/2018 CLINICAL DATA:  Acute renal failure EXAM: RENAL / URINARY TRACT ULTRASOUND COMPLETE COMPARISON:  CT 03/29/2017 FINDINGS: Right Kidney: Renal measurements: 9.6 x 5.7 x 5.2 cm = volume: 142 mL. Diffusely increased echotexture. No mass or hydronephrosis. Left Kidney: Renal measurements: 9.1 x 4.4 x 3.4 cm = volume: 71 mL. Diffusely increased echotexture. No mass or hydronephrosis. Bladder: Not visualized, decompressed with Foley catheter in place. IMPRESSION: Atrophic, echogenic kidneys compatible with chronic medical renal disease. No hydronephrosis. Electronically Signed   By: Rolm Baptise M.D.   On: 06/04/2018 09:48    EKG:   Orders placed or performed during the hospital encounter of 06/04/18  . ED EKG  . ED EKG  . EKG 12-Lead  . EKG 12-Lead    ASSESSMENT AND PLAN:   77 year old female with multiple medical problems including hypertension, low back surgery, CKD, diabetes and hypertension was admitted to hospital secondary to worsening low back pain and weakness.  1.  Lower back wound-patient had L3-L4, L4-L5 laminectomies done in August 2018, postoperatively had a wound infection with a wound VAC in place. -now  Noticed increased lower extremity edema for almost a month.  Noted to have a lower back wound with expressing pus-appreciate surgical consult. -MRI of the lumbar spine confirming air-fluid levels within paraspinal muscles and subcutaneous fat, possibly myositis versus muscle denervation.  There is moderate canal stenosis at L2-L3 level. -We will need further neurosurgical  expiration especially given her laminectomy and hardware in lower back done last year in August 2018. -ID consult is appreciated -For now continue vancomycin and cefepime.  Flagyl has been added by ID.  Her previous cultures grew Proteus based on the discharge summary  2.  Anemia-acute on chronic.  Has significant iron deficiency anemia. -Started IV iron for 3 days.  GI consult for outpatient once the acute issues are taken care of -No active bleeding noted -Since hemoglobin was at 6.8 yesterday, received 1 unit packed RBC transfusion.  3.  Acute renal failure on CKD-known CKD stage III with baseline creatinine around 1.6.  Creatinine now at 2.4.  Appears fluid overloaded. -Renal ultrasound showing atrophy kidneys.  Nephrology consult requested -Lasix is on hold for now  4.  Hypertensive urgency-off nicardipine drip.  Blood pressure is improved.  On clonidine, labetalol, losartan at this time  5.  Hypothyroidism-Synthroid  6.  DVT prophylaxis-teds and SCDs for now due to anemia  Patient will be [facility] today   All the records are reviewed and case discussed with Care Management/Social Workerr. Management plans discussed with the patient, family and they are in agreement.  CODE STATUS: Full code  TOTAL TIME TAKING CARE OF THIS PATIENT: 41 minutes.   POSSIBLE D/C IN 2 DAYS, DEPENDING ON CLINICAL CONDITION.   Gladstone Lighter M.D on 06/05/2018 at 8:36 AM  Between 7am to 6pm - Pager - [phone]  After 6pm go to [email or web address] - password [email or web address] [facility]  Office  [phone]  CC: Primary care physician; McLean-Scocuzza, Nino Glow, MD

## 2018-06-05 NOTE — Discharge Summary (Signed)
Name: Taylor Tyler MRN: [id number] DOB: February 01, 1941    ADMISSION DATE:  06/04/2018 CONSULTATION DATE:  06/04/2018  REFERRING MD :  Dr. Marcille Blanco   CHIEF COMPLAINT:  Hypertensive Emergency  HISTORY OF PRESENT ILLNESS:  77 y.o. Female admitted with Hypertensive Emergency requiring Nicardipine drip,  AKI on CKD III, and tunneling wound/abcess to her back. See H & P for rest of the HPI  SIGNIFICANT EVENTS  06/04/18>> Admission to [place] 11/02/219: MRI shows air-fluid levels within the  paraspinal muscles and subcutaneous fat along surgical approach and within surgical bed and paraspinal muscle denervation versus myositis.Transfer to [facility] for neurosurgical evaluation.   STUDIES:  DG Lumbar Spine 06/04/18>> No acute fracture or subluxation of the lumbar spine. Stable postoperative changes. A pocket of air over the hardware, likely superimposed bowel.  CT Left Hip 06/04/18>> CT Abdomen / Pelvis 06/04/18>>   CULTURES: Wound culture from back 06/04/18>>  ANTIBIOTICS: Vancomycin 06/04/18>> Zosyn 06/04/18 Flagyl 06/04/2018>  HOSPITAL COURSE:   Taylor Tyler is a 77 y.o. Female with a PMH as listed below, who presents to [facility] ED on 06/04/18 with c/o left hip pain and bilateral Lower extremity edema.  Upon presentation to the ED she was afebrile, with BP 218/63.  Initial workup in the ED revealed Creatinine 2.45, BNP 1059, Troponin 0.03, and WBC 17.7.  CXR was concerning for moderate cardiomegaly with minimal vascular congestion, no edema or focal consolidation noted. X-ray of the lumbar spine was negative for acute fracture or subluxation of the lumbar spine, however air was noted over her previous hardware. Pt reported that she had a Lumbar fusion performed in July 2018, that got infected and required debridement in August 2018.  Upon arrival to ICU, it was noted that her left hip was reddened and painful, and she had a tunneling wound/abcess to her lower lumbar spine region.    She was admitted to [facility] unit for treatment of Hypertensive Emergency and placed on a Nicardipine drip. Blood pressure has improved and she has been weaned off the gtte.  This evening,, she had an MRI that was concerning for an extensive spinal infection. Surgery reviewed MRI and recommended immediate neurosurgical evaluation. She is being transferred to [facility] so she can be evaluated by neurosurgery. She is on flagyl, cefepime and vancomycin. She has a fever during the day but is currently afebrile, fully alert and with no specific complaints.    PAST MEDICAL HISTORY :   has a past medical history of Acute blood loss anemia (03/14/2017), Acute renal failure superimposed on stage 3 chronic kidney disease ([place]) (03/14/2017), AKI (acute kidney injury) ([place]) (02/28/2017), Altered mental status (03/29/2017), Anemia, Aortic atherosclerosis ([place]), Arthritis, Bilateral lower extremity edema (03/04/2017), Bradycardia, Cataract, Chronic kidney disease, CKD (chronic kidney disease) stage 3, GFR 30-59 ml/min ([place]) (01/15/2017), Diabetes mellitus without complication ([place]), Disease of pancreas, Diverticulitis, Diverticulosis (01/15/2017), DJD (degenerative joint disease), DM (diabetes mellitus), type 2 with renal complications ([place]) (6/60/6301), Fatty liver, Hypertension, Hypothyroidism, Hypothyroidism, Kidney stone, Lethargy (02/28/2017), Obesity, Class III, BMI 40-49.9 (morbid obesity) ([place]) (02/27/2017), Pleural lipoma, Postoperative wound infection (04/02/2017), Spinal stenosis of lumbar region (01/15/2017), Status post lumbar surgery (02/24/2017), Ventral hernia, and Wound healing, delayed.  has a past surgical history that includes Cholecystectomy (1982); Lumbar laminectomy with spinous process plate 2 level (N/A, 01/03/931); Gallbladder surgery (1982); Back surgery (2013); Eye surgery (Right, 2017); Lumbar fusion (02/24/2017); Anterior lumbar fusion (N/A, 02/24/2017); Anterior lat lumbar fusion (N/A, 02/24/2017); Abdominal exposure (N/A,  02/24/2017); Application of robotic  assistance for spinal procedure (N/A, 02/26/2017); Lumbar wound debridement (N/A, 03/30/2017); Application if wound vac (N/A, 03/30/2017); Abdominal hysterectomy (1987); Partial colectomy; Ventral hernia repair; Appendectomy; Breast surgery (1992); Colon surgery (01/26/2014); and Colon surgery. Prior to Admission medications   Medication Sig Start Date End Date Taking? Authorizing Provider  acetaminophen (TYLENOL) 500 MG tablet Take 2 tablets (1,000 mg total) by mouth 3 (three) times daily. 04/04/17   Everrett Coombe, MD  atorvastatin (LIPITOR) 40 MG tablet Take 1 tablet (40 mg total) by mouth every evening. 02/11/18   McLean-Scocuzza, Nino Glow, MD  blood glucose meter kit and supplies KIT Dispense based on patient and insurance preference. Use up to four times daily as directed. (FOR ICD-9 250.00, 250.01). 08/31/17   McLean-Scocuzza, Nino Glow, MD  Cholecalciferol 1000 units capsule Take 1 capsule (1,000 Units total) by mouth daily. 08/06/17   McLean-Scocuzza, Nino Glow, MD  cloNIDine (CATAPRES) 0.1 MG tablet Take 1 tablet (0.1 mg total) by mouth 2 (two) times daily. 01/29/18   McLean-Scocuzza, Nino Glow, MD  diclofenac sodium (VOLTAREN) 1 % GEL Apply 4 g topically 4 (four) times daily. Knees b/l 01/05/18   McLean-Scocuzza, Nino Glow, MD  doxycycline (VIBRA-TABS) 100 MG tablet Take 1 tablet (100 mg total) by mouth 2 (two) times daily. With food 04/19/18   McLean-Scocuzza, Nino Glow, MD  ferrous sulfate 325 (65 FE) MG tablet Take 1 tablet (325 mg total) by mouth daily with lunch. 08/06/17   McLean-Scocuzza, Nino Glow, MD  furosemide (LASIX) 40 MG tablet Take 1 tablet (40 mg total) by mouth daily. In am 01/25/18   McLean-Scocuzza, Nino Glow, MD  gabapentin (NEURONTIN) 300 MG capsule Take 1 capsule (300 mg total) by mouth at bedtime. 01/05/18   McLean-Scocuzza, Nino Glow, MD  labetalol (NORMODYNE) 100 MG tablet Take 100 mg by mouth 2 (two) times daily.  02/17/18 02/17/19  [provider]    levothyroxine (SYNTHROID, LEVOTHROID) 112 MCG tablet Take 1 tablet (112 mcg total) by mouth daily before breakfast. 04/28/18   McLean-Scocuzza, Nino Glow, MD  losartan (COZAAR) 100 MG tablet Take 1 tablet (100 mg total) by mouth daily after breakfast. 02/11/18   McLean-Scocuzza, Nino Glow, MD  Magnesium Oxide 400 MG CAPS Take 1 capsule (400 mg total) by mouth daily. 02/08/18   McLean-Scocuzza, Nino Glow, MD  vitamin C (ASCORBIC ACID) 500 MG tablet Take 500 mg by mouth. Take one tablet twice daily to aid wound healing    [provider]   Allergies  Allergen Reactions  . Clams [Shellfish Allergy] Swelling and Other (See Comments)    THROAT SWELLS NECK TURNS RED  . Hydralazine Other (See Comments)    CHEST TIGHTNESS  . Norvasc [Amlodipine Besylate] Swelling    Leg edema   . Milk-Related Compounds Diarrhea    FAMILY HISTORY:  family history includes Diabetes in her brother, brother, father, and mother; Heart attack in her father; Heart disease in her brother, brother, father, and mother; Hypertension in her father and mother; Kidney disease in her father and mother; Lupus in her sister and sister. SOCIAL HISTORY:  reports that she has never smoked. She has never used smokeless tobacco. She reports that she does not drink alcohol or use drugs.  REVIEW OF SYSTEMS:  Positives in BOLD Constitutional: Negative for fever, chills, weight loss, malaise/fatigue and diaphoresis.  HENT: Negative for hearing loss, ear pain, nosebleeds, congestion, sore throat, neck pain, tinnitus and ear discharge.   Eyes: Negative for blurred vision, double vision, photophobia, pain, discharge  and redness.  Respiratory: Negative for cough, hemoptysis, sputum production, shortness of breath, wheezing and stridor.   Cardiovascular: Negative for chest pain, palpitations, orthopnea, claudication, +leg swelling and PND.  Gastrointestinal: Negative for heartburn, nausea, vomiting, abdominal pain, diarrhea, constipation,  blood in stool and melena.  Genitourinary: Negative for dysuria, urgency, frequency, hematuria and flank pain.  Musculoskeletal: Negative for myalgias, +back pain,+ left hip pain,  joint pain and falls.  Skin: Negative for itching and rash.  Neurological: Negative for dizziness, tingling, tremors, sensory change, speech change, focal weakness, seizures, loss of consciousness, weakness and headaches.  Endo/Heme/Allergies: Negative for environmental allergies and polydipsia. Does not bruise/bleed easily.  VITAL SIGNS: Temp:  [98.8 F (37.1 C)-100.4 F (38 C)] 100.4 F (38 C) (11/01 2040) Pulse Rate:  [60-91] 77 (11/01 2040) Resp:  [14-23] 20 (11/01 2040) BP: (136-252)/(57-88) 177/57 (11/01 2040) SpO2:  [92 %-100 %] 96 % (11/01 2040)  PHYSICAL EXAMINATION: General:  Acutely ill appearing female, laying in bed, on room air, in NAD Neuro:  Awake, A&O x4, follows commands, no focal deficits HEENT:  Atraumatic, normocephalic, neck supple, no JVD Cardiovascular:  RRR, s1s2, no M/R/G, 2+ radial pusles, 1+ pedal pulses Lungs:  Clear bilaterally, even, nonlabored, normal effort, no assessory muscle use Abdomen:  Obese, soft, nontender, nondistended, BS+ x4 Musculoskeletal:  No deformities, Left hip is reddened and painful to touch and movement, 3+ pitting edema bilateral LE Skin:  Tunneling Wound to lower back oozing pus  Recent Labs  Lab 06/04/18 0245 06/04/18 1315  NA 138 137  K 3.9 4.3  CL 104 104  CO2 25 24  BUN 58* 51*  CREATININE 2.45* 2.36*  GLUCOSE 114* 199*   Recent Labs  Lab 06/04/18 0245 06/04/18 1315  HGB 7.7* 6.8*  HCT 24.8* 21.3*  WBC 17.7* 18.8*  PLT 456* 355   Dg Chest 2 View  Result Date: 06/04/2018 CLINICAL DATA:  77 year old female with shortness of breath. EXAM: CHEST - 2 VIEW COMPARISON:  Chest radiograph dated 03/29/2017 FINDINGS: There is moderate cardiomegaly. Minimal vascular congestion. No edema. No focal consolidation, pleural effusion, or  pneumothorax. No acute osseous pathology. IMPRESSION: Moderate cardiomegaly with minimal vascular congestion. No edema or focal consolidation. Electronically Signed   By: Anner Crete M.D.   On: 06/04/2018 04:01   Dg Lumbar Spine Complete  Result Date: 06/04/2018 CLINICAL DATA:  77 year old female with chronic lower back pain. EXAM: LUMBAR SPINE - COMPLETE 4+ VIEW COMPARISON:  Lumbar spine MRI dated 03/30/2018 FINDINGS: There is no acute fracture or subluxation of the lumbar spine. There is osteopenia with multilevel degenerative changes. L3-S1 disc spacers and posterior fixation rod and screws as well as L5-S1 anterior fusion noted. Overall these findings are similar to the radiograph of 03/09/2018. Apparent large pocket of air along the posterior lower lumbar spine at L4-S1 and along the fixation hardware likely represents superimposed bowel. Clinical correlation is recommended. There is atherosclerotic calcification of the aorta. IMPRESSION: 1. No acute fracture or subluxation of the lumbar spine. Stable postoperative changes. 2. A pocket of air over the hardware, likely superimposed bowel. Electronically Signed   By: Anner Crete M.D.   On: 06/04/2018 04:09   Mr Lumbar Spine Wo Contrast  Result Date: 06/04/2018 CLINICAL DATA:  Fluid leaking from spinal wound incision, history of multiple lumbar spine surgeries. Status post lumbar spine wound debridement March 30, 2017. EXAM: MRI LUMBAR SPINE WITHOUT CONTRAST TECHNIQUE: Multiplanar, multisequence MR imaging of the lumbar spine was performed. No intravenous contrast was  administered. COMPARISON:  Lumbar spine radiographs June 04, 2018 and MRI lumbar spine March 30, 2018 FINDINGS: Moderately motion degraded examination. SEGMENTATION: For the purposes of this report, the last well-formed intervertebral disc is reported as L5-S1. ALIGNMENT: Maintained lumbar lordosis. Minimal grade 1 L2-3 retrolisthesis. No spondylolysis. VERTEBRAE:Status post L3  through S1 PLIF, susceptibility artifact from hardware. Mild L1-2 disc height loss with mild disc desiccation. No suspicious or acute bone marrow signal the limited by hardware artifact and patient motion. CONUS MEDULLARIS AND CAUDA EQUINA: Conus medullaris terminates at L1-2 and demonstrates normal morphology and signal characteristics. Limited assessment of cauda equina due to hardware artifact common no nerve root clumping. PARASPINAL AND OTHER SOFT TISSUES: Multiple air-fluid levels within the paraspinal muscles and subcutaneous fat along surgical approach and within surgical bed. Postoperative paraspinal muscle denervation with superimposed RIGHT interstitial STIR signal. DISC LEVELS (limited assessment due to hardware artifact and motion): L1-2: Small broad-based disc bulge, mild facet arthropathy and ligamentum flavum redundancy. No canal stenosis. Mild bilateral neural foraminal narrowing. L2-3: Retrolisthesis. Moderate broad-based disc bulge. Moderate canal stenosis. Mild neural foraminal narrowing may be overestimated by hardware artifact. L3-4: PLIF, posterior decompression. No canal stenosis or definite neural foraminal narrowing. L4-5: PLIF and posterior decompression. No canal stenosis. Moderate possible LEFT neural foraminal narrowing, potentially overestimated by hardware artifact. L5-S1: PLIF and posterior decompression. No canal stenosis. Small suspected central disc extrusion, stable from prior examination. No definite neural foraminal narrowing. IMPRESSION: 1. Air-fluid levels within paraspinal muscles and subcutaneous fat along surgical approach and within surgical bed, query recent intervention, alternatively this could reflect infection. Additional paraspinal muscle denervation versus myositis. 2. Status post L3 through S1 PLIF and posterior decompression. Minimal grade 1 L2-3 retrolisthesis. 3. Moderate canal stenosis L2-3. Multilevel suspected neural foraminal narrowing, potentially moderate  on the LEFT at L4-5. 4. These results will be called to the ordering clinician or representative by the Radiologist Assistant, and communication documented in the PACS or zVision Dashboard. Electronically Signed   By: Elon Alas M.D.   On: 06/04/2018 23:14   US Renal  Result Date: 06/04/2018 CLINICAL DATA:  Acute renal failure EXAM: RENAL / URINARY TRACT ULTRASOUND COMPLETE COMPARISON:  CT 03/29/2017 FINDINGS: Right Kidney: Renal measurements: 9.6 x 5.7 x 5.2 cm = volume: 142 mL. Diffusely increased echotexture. No mass or hydronephrosis. Left Kidney: Renal measurements: 9.1 x 4.4 x 3.4 cm = volume: 71 mL. Diffusely increased echotexture. No mass or hydronephrosis. Bladder: Not visualized, decompressed with Foley catheter in place. IMPRESSION: Atrophic, echogenic kidneys compatible with chronic medical renal disease. No hydronephrosis. Electronically Signed   By: Rolm Baptise M.D.   On: 06/04/2018 09:48   DISCHARGE PLAN  CARDIAC A: Hypertensive Emergency Elevated BNP, in setting of AKI vs CHF CXR with cardiomegaly and minimal vascular congestion and no edema Mildly elevated Troponin, likely demand ischemia P: Cardiac monitoring Nicardipine drip to maintain SBP 140 - 160 Continue PO Clonidine and Labetalol Hold off on Diuresis for now given AKI and no pulmonary edema or Respiratory Failure F/u  Echocardiogram Trend Troponin  RENAL A: AKI on CKD III P: Monitor I&O's / urinary output f/u BMP Ensure adequate renal perfusion Avoid nephrotoxic agents as able Replace electrolytes as indicated  Renal Ultrasound unremarkable Consider Nephrology consult   INFECTIOUS A: Leukocytosis, questionableTunneling wound/abcess to lumbar region of back s/p hard ware vs. Superimposed gas shadow of colon. P: Monitor fever curve Trend WBC's F/U Wound culture  Vancomycin, Flagyl and cefepime Surgery consulted, appreciate input Wound care consulted,  appreciate input F/u UA Monitor  Procalcitonin and lactic acid See MRI results  HEME A: Anemia without signs of active bleeding P: Monitor for S/Sx of bleeding Trend CBC Will avoid pharmacologic VTE prophylaxis for now given pt's anemia; use SCD's Transfuse for Hgb <7 Check for occult stool    DISPOSITION: Stepdown GOALS OF CARE: Full code  VTE PROPHYLAXIS: SCD's UPDATES: Updated pt at bedside 06/04/18 DISPOSITION: TRANSFER TO [facility]. [facility] ANP-BC Pulmonary and Critical Care Medicine [facility] Pager [phone] or [phone]  NB: This document was prepared using Dragon voice recognition software and may include unintentional dictation errors.    06/05/2018, 2:22 AM

## 2018-06-06 ENCOUNTER — Encounter (HOSPITAL_COMMUNITY)
Admission: AD | Disposition: A | Discharge status: Home Health | Payer: Self-pay | Source: Other Acute Inpatient Hospital | Attending: Internal Medicine

## 2018-06-06 ENCOUNTER — Encounter (HOSPITAL_COMMUNITY): Payer: Self-pay | Admitting: Anesthesiology

## 2018-06-06 ENCOUNTER — Inpatient Hospital Stay (HOSPITAL_COMMUNITY): Payer: Medicare Other | Admitting: Anesthesiology

## 2018-06-06 HISTORY — PX: LUMBAR WOUND DEBRIDEMENT: SHX1988

## 2018-06-06 LAB — COMPREHENSIVE METABOLIC PANEL
ALBUMIN: 1.6 g/dL — AB (ref 3.5–5.0)
ALT: 11 U/L (ref 0–44)
AST: 13 U/L — AB (ref 15–41)
Alkaline Phosphatase: 60 U/L (ref 38–126)
Anion gap: 6 (ref 5–15)
BUN: 45 mg/dL — AB (ref 8–23)
CHLORIDE: 107 mmol/L (ref 98–111)
CO2: 23 mmol/L (ref 22–32)
CREATININE: 2.1 mg/dL — AB (ref 0.44–1.00)
Calcium: 8.3 mg/dL — ABNORMAL LOW (ref 8.9–10.3)
GFR calc Af Amer: 25 mL/min — ABNORMAL LOW (ref >60)
GFR, EST NON AFRICAN AMERICAN: 22 mL/min — AB (ref >60)
GLUCOSE: 92 mg/dL (ref 70–99)
POTASSIUM: 4 mmol/L (ref 3.5–5.1)
Sodium: 136 mmol/L (ref 135–145)
Total Bilirubin: 0.6 mg/dL (ref 0.3–1.2)
Total Protein: 6 g/dL — ABNORMAL LOW (ref 6.5–8.1)

## 2018-06-06 LAB — CBC
HCT: 25.3 % — ABNORMAL LOW (ref 36.0–46.0)
Hemoglobin: 8.1 g/dL — ABNORMAL LOW (ref 12.0–15.0)
MCH: 26 pg (ref 26.0–34.0)
MCHC: 32 g/dL (ref 30.0–36.0)
MCV: 81.4 fL (ref 80.0–100.0)
PLATELETS: 352 10*3/uL (ref 150–400)
RBC: 3.11 MIL/uL — AB (ref 3.87–5.11)
RDW: 15.3 % (ref 11.5–15.5)
WBC: 15 10*3/uL — ABNORMAL HIGH (ref 4.0–10.5)
nRBC: 0 % (ref 0.0–0.2)

## 2018-06-06 LAB — MAGNESIUM
Magnesium: 1.8 mg/dL (ref 1.7–2.4)
Magnesium: 1.8 mg/dL (ref 1.7–2.4)

## 2018-06-06 LAB — GLUCOSE, CAPILLARY: GLUCOSE-CAPILLARY: 101 mg/dL — AB (ref 70–99)

## 2018-06-06 SURGERY — LUMBAR WOUND DEBRIDEMENT
Anesthesia: General | Site: Back

## 2018-06-06 MED ORDER — DEXAMETHASONE SODIUM PHOSPHATE 10 MG/ML IJ SOLN
INTRAMUSCULAR | Status: DC | PRN
  Administered 2018-06-06: 4 mg via INTRAVENOUS

## 2018-06-06 MED ORDER — LABETALOL HCL 5 MG/ML IV SOLN
5.0000 mg | INTRAVENOUS | Status: AC | PRN
  Administered 2018-06-06 (×4): 5 mg via INTRAVENOUS

## 2018-06-06 MED ORDER — HEMOSTATIC AGENTS (NO CHARGE) OPTIME
TOPICAL | Status: DC | PRN
  Administered 2018-06-06: 1 via TOPICAL

## 2018-06-06 MED ORDER — SODIUM CHLORIDE 0.9 % IV SOLN
500.0000 mg | Freq: Two times a day (BID) | INTRAVENOUS | Status: DC
  Administered 2018-06-06 – 2018-06-07 (×3): 500 mg via INTRAVENOUS
  Filled 2018-06-06 (×4): qty 0.5

## 2018-06-06 MED ORDER — DEXAMETHASONE SODIUM PHOSPHATE 10 MG/ML IJ SOLN
INTRAMUSCULAR | Status: AC
  Filled 2018-06-06: qty 1

## 2018-06-06 MED ORDER — HYDROMORPHONE HCL 1 MG/ML IJ SOLN
0.2500 mg | INTRAMUSCULAR | Status: DC | PRN
  Administered 2018-06-06 (×2): 0.5 mg via INTRAVENOUS

## 2018-06-06 MED ORDER — PROPOFOL 10 MG/ML IV BOLUS
INTRAVENOUS | Status: DC | PRN
  Administered 2018-06-06: 130 mg via INTRAVENOUS

## 2018-06-06 MED ORDER — FENTANYL CITRATE (PF) 250 MCG/5ML IJ SOLN
INTRAMUSCULAR | Status: AC
  Filled 2018-06-06: qty 5

## 2018-06-06 MED ORDER — 0.9 % SODIUM CHLORIDE (POUR BTL) OPTIME
TOPICAL | Status: DC | PRN
  Administered 2018-06-06: 1000 mL

## 2018-06-06 MED ORDER — LIDOCAINE 2% (20 MG/ML) 5 ML SYRINGE
INTRAMUSCULAR | Status: DC | PRN
  Administered 2018-06-06: 100 mg via INTRAVENOUS

## 2018-06-06 MED ORDER — LACTATED RINGERS IV SOLN
INTRAVENOUS | Status: DC | PRN
  Administered 2018-06-06: 07:00:00 via INTRAVENOUS

## 2018-06-06 MED ORDER — SODIUM CHLORIDE 0.9 % IV SOLN
INTRAVENOUS | Status: DC | PRN
  Administered 2018-06-06: 500 mL

## 2018-06-06 MED ORDER — PROPOFOL 10 MG/ML IV BOLUS
INTRAVENOUS | Status: AC
  Filled 2018-06-06: qty 20

## 2018-06-06 MED ORDER — LIDOCAINE 2% (20 MG/ML) 5 ML SYRINGE
INTRAMUSCULAR | Status: AC
  Filled 2018-06-06: qty 5

## 2018-06-06 MED ORDER — CARVEDILOL 25 MG PO TABS
25.0000 mg | ORAL_TABLET | Freq: Two times a day (BID) | ORAL | Status: DC
  Administered 2018-06-06 – 2018-06-12 (×12): 25 mg via ORAL
  Filled 2018-06-06 (×12): qty 1

## 2018-06-06 MED ORDER — THROMBIN 5000 UNITS EX SOLR
CUTANEOUS | Status: AC
  Filled 2018-06-06: qty 5000

## 2018-06-06 MED ORDER — THROMBIN (RECOMBINANT) 20000 UNITS EX SOLR
CUTANEOUS | Status: AC
  Filled 2018-06-06: qty 20000

## 2018-06-06 MED ORDER — ONDANSETRON HCL 4 MG/2ML IJ SOLN
INTRAMUSCULAR | Status: AC
  Filled 2018-06-06: qty 2

## 2018-06-06 MED ORDER — ONDANSETRON HCL 4 MG/2ML IJ SOLN
INTRAMUSCULAR | Status: DC | PRN
  Administered 2018-06-06: 4 mg via INTRAVENOUS

## 2018-06-06 MED ORDER — SCOPOLAMINE 1 MG/3DAYS TD PT72
MEDICATED_PATCH | TRANSDERMAL | Status: DC | PRN
  Administered 2018-06-06: 1 via TRANSDERMAL

## 2018-06-06 MED ORDER — SUGAMMADEX SODIUM 200 MG/2ML IV SOLN
INTRAVENOUS | Status: DC | PRN
  Administered 2018-06-06: 212 mg via INTRAVENOUS

## 2018-06-06 MED ORDER — ALBUMIN HUMAN 5 % IV SOLN
INTRAVENOUS | Status: DC | PRN
  Administered 2018-06-06: 08:00:00 via INTRAVENOUS

## 2018-06-06 MED ORDER — PROMETHAZINE HCL 25 MG/ML IJ SOLN: 6.2500 mg | INTRAMUSCULAR | Status: DC | PRN

## 2018-06-06 MED ORDER — ROCURONIUM BROMIDE 50 MG/5ML IV SOSY
PREFILLED_SYRINGE | INTRAVENOUS | Status: AC
  Filled 2018-06-06: qty 5

## 2018-06-06 MED ORDER — HYDROMORPHONE HCL 1 MG/ML IJ SOLN
INTRAMUSCULAR | Status: AC
  Administered 2018-06-06: 0.5 mg via INTRAVENOUS
  Filled 2018-06-06: qty 1

## 2018-06-06 MED ORDER — SCOPOLAMINE 1 MG/3DAYS TD PT72
MEDICATED_PATCH | TRANSDERMAL | Status: AC
  Filled 2018-06-06: qty 1

## 2018-06-06 MED ORDER — SODIUM CHLORIDE 0.9 % IR SOLN
Status: DC | PRN
  Administered 2018-06-06: 3000 mL

## 2018-06-06 MED ORDER — CLONIDINE HCL 0.1 MG PO TABS
0.1000 mg | ORAL_TABLET | Freq: Three times a day (TID) | ORAL | Status: DC
  Administered 2018-06-06 – 2018-06-11 (×17): 0.1 mg via ORAL
  Filled 2018-06-06 (×17): qty 1

## 2018-06-06 MED ORDER — THROMBIN 5000 UNITS EX SOLR
OROMUCOSAL | Status: DC | PRN
  Administered 2018-06-06: 5 mL via TOPICAL

## 2018-06-06 MED ORDER — FENTANYL CITRATE (PF) 250 MCG/5ML IJ SOLN
INTRAMUSCULAR | Status: DC | PRN
  Administered 2018-06-06 (×2): 50 ug via INTRAVENOUS

## 2018-06-06 MED ORDER — PHENYLEPHRINE 40 MCG/ML (10ML) SYRINGE FOR IV PUSH (FOR BLOOD PRESSURE SUPPORT)
PREFILLED_SYRINGE | INTRAVENOUS | Status: AC
  Filled 2018-06-06: qty 10

## 2018-06-06 MED ORDER — ROCURONIUM BROMIDE 50 MG/5ML IV SOSY
PREFILLED_SYRINGE | INTRAVENOUS | Status: DC | PRN
  Administered 2018-06-06: 50 mg via INTRAVENOUS

## 2018-06-06 MED ORDER — BACITRACIN ZINC 500 UNIT/GM EX OINT
TOPICAL_OINTMENT | CUTANEOUS | Status: AC
  Filled 2018-06-06: qty 28.35

## 2018-06-06 MED ORDER — LABETALOL HCL 5 MG/ML IV SOLN
INTRAVENOUS | Status: AC
  Administered 2018-06-06: 5 mg via INTRAVENOUS
  Filled 2018-06-06: qty 4

## 2018-06-06 MED ORDER — EPHEDRINE SULFATE-NACL 50-0.9 MG/10ML-% IV SOSY
PREFILLED_SYRINGE | INTRAVENOUS | Status: DC | PRN
  Administered 2018-06-06 (×5): 10 mg via INTRAVENOUS

## 2018-06-06 MED ORDER — BACITRACIN ZINC 500 UNIT/GM EX OINT
TOPICAL_OINTMENT | CUTANEOUS | Status: DC | PRN
  Administered 2018-06-06: 1 via TOPICAL

## 2018-06-06 SURGICAL SUPPLY — 42 items
BAG DECANTER FOR FLEXI CONT (MISCELLANEOUS) ×3 IMPLANT
CANISTER SUCT 3000ML PPV (MISCELLANEOUS) ×3 IMPLANT
CARTRIDGE OIL MAESTRO DRILL (MISCELLANEOUS) ×1 IMPLANT
COVER WAND RF STERILE (DRAPES) ×3 IMPLANT
DIFFUSER DRILL AIR PNEUMATIC (MISCELLANEOUS) ×3 IMPLANT
DRAPE LAPAROTOMY 100X72X124 (DRAPES) ×3 IMPLANT
DRAPE POUCH INSTRU U-SHP 10X18 (DRAPES) ×3 IMPLANT
DRSG OPSITE POSTOP 4X6 (GAUZE/BANDAGES/DRESSINGS) ×3 IMPLANT
ELECT REM PT RETURN 9FT ADLT (ELECTROSURGICAL) ×3
ELECTRODE REM PT RTRN 9FT ADLT (ELECTROSURGICAL) ×1 IMPLANT
GAUZE 4X4 16PLY RFD (DISPOSABLE) IMPLANT
GAUZE SPONGE 4X4 12PLY STRL (GAUZE/BANDAGES/DRESSINGS) IMPLANT
GLOVE BIO SURGEON STRL SZ7 (GLOVE) ×9 IMPLANT
GLOVE BIOGEL PI IND STRL 7.0 (GLOVE) IMPLANT
GLOVE BIOGEL PI IND STRL 7.5 (GLOVE) ×3 IMPLANT
GLOVE BIOGEL PI INDICATOR 7.0 (GLOVE)
GLOVE BIOGEL PI INDICATOR 7.5 (GLOVE) ×6
GLOVE ECLIPSE 7.0 STRL STRAW (GLOVE) ×3 IMPLANT
GLOVE EXAM NITRILE XL STR (GLOVE) IMPLANT
GOWN STRL REUS W/ TWL LRG LVL3 (GOWN DISPOSABLE) ×1 IMPLANT
GOWN STRL REUS W/ TWL XL LVL3 (GOWN DISPOSABLE) IMPLANT
GOWN STRL REUS W/TWL 2XL LVL3 (GOWN DISPOSABLE) IMPLANT
GOWN STRL REUS W/TWL LRG LVL3 (GOWN DISPOSABLE) ×2
GOWN STRL REUS W/TWL XL LVL3 (GOWN DISPOSABLE)
KIT BASIN OR (CUSTOM PROCEDURE TRAY) ×3 IMPLANT
KIT TURNOVER KIT B (KITS) ×3 IMPLANT
NEEDLE HYPO 22GX1.5 SAFETY (NEEDLE) ×3 IMPLANT
NS IRRIG 1000ML POUR BTL (IV SOLUTION) ×3 IMPLANT
OIL CARTRIDGE MAESTRO DRILL (MISCELLANEOUS) ×3
PACK LAMINECTOMY NEURO (CUSTOM PROCEDURE TRAY) ×3 IMPLANT
PAD ARMBOARD 7.5X6 YLW CONV (MISCELLANEOUS) ×9 IMPLANT
SET CYSTO W/LG BORE CLAMP LF (SET/KITS/TRAYS/PACK) ×3 IMPLANT
SPONGE SURGIFOAM ABS GEL SZ50 (HEMOSTASIS) IMPLANT
SUT PROLENE 0 CT 1 30 (SUTURE) ×3 IMPLANT
SUT VIC AB 0 CT1 18XCR BRD8 (SUTURE) ×2 IMPLANT
SUT VIC AB 0 CT1 8-18 (SUTURE) ×4
SUT VICRYL 3-0 RB1 18 ABS (SUTURE) IMPLANT
SWAB COLLECTION DEVICE MRSA (MISCELLANEOUS) IMPLANT
SWAB CULTURE ESWAB REG 1ML (MISCELLANEOUS) IMPLANT
TOWEL GREEN STERILE (TOWEL DISPOSABLE) ×3 IMPLANT
TOWEL GREEN STERILE FF (TOWEL DISPOSABLE) ×3 IMPLANT
WATER STERILE IRR 1000ML POUR (IV SOLUTION) ×3 IMPLANT

## 2018-06-06 NOTE — Progress Notes (Signed)
PT Cancellation Note  Patient Details Name: MASAYO FERA MRN: [id number] DOB: [birth year]   Cancelled Treatment:    Reason Eval/Treat Not Completed: Patient at procedure or test/unavailable(at OR)   Duncan Dull 06/06/2018, 8:25 AM Alben Deeds, PT DPT  Board Certified Neurologic Specialist [place] Pager [phone] Office [phone]

## 2018-06-06 NOTE — Progress Notes (Addendum)
PROGRESS NOTE                                                                                                                                                                                                             Patient Demographics:    Taylor Tyler, is a 77 y.o. female, DOB - 24-Jun-1941, PNT:[id number]  Admit date - 06/05/2018   Admitting Physician Guilford Shi, MD  Outpatient Primary MD for the patient is McLean-Scocuzza, Nino Glow, MD  LOS - 1  CC back pain     Brief Narrative    Taylor Tyler  is a 77 y.o. AA female with H/O Morbid Obesity, CKD 3, DM2, HTN, anemia of chronic disease, fatty liver, hypothyroidism, status post L spine surgery along with L5-S1 fusion done in July 2018 at [facility] by Dr. Cyndy Freeze, lives in [facility] by herself and presented to [facility] hospital few days ago with 1 month duration of low back pain mostly on the left side radiating down to her left leg and some discharge from her postop back scar, at the hospital she was diagnosed with postop site infection and abscess with pus drainage, poorly controlled blood pressure, she was admitted to the hospital seen by ID, her case was discussed by neurosurgery at [facility] and then she was transferred here for further care.   Subjective:    Taylor Tyler today has, No headache, No chest pain, No abdominal pain - No Nausea, No new weakness tingling or numbness, No Cough - SOB. Mild low back pain.   Assessment  & Plan :     1. Post op L.Spine Abscess - with MRI showing air-fluid levels within the paraspinal muscles and subcutaneous fat along the surgical approach and within the surgical bed, currently does have leukocytosis however no evidence of frank sepsis.    This likely is subacute to chronic infection, she has been placed on broad-spectrum IV vancomycin and meropenem, ID and neurosurgery are following the patient, she underwent incision and drainage by neurosurgery on  06/06/2018.  Drain in place.  Still has mild low back pain and continues to have some left leg weakness.  Commence PT will most likely require SNF and prolonged antibiotics.  May require PICC line as well.  Cultures thus far negative.  Continue to follow intraoperative cultures.  2.  Initial hypertension.  In poor control.  Increased Coreg and Catapres dose, continue Imdur and Norvasc, she had mild hydralazine allergy upon interviewing her and  her son, may need to add hydralazine for better control.  Of note her SBP's have been running between 190-200 for last several months according to the son hence we will bring down the blood pressure gradually.  3.  ARF on CKD 4.  Baseline creatinine is 1.6.  Renal ultrasound shows CKD, continue to monitor urine output and renal function.  Creatinine peaked around 2.4 and now gradually trending down.  4.  Mild acute on chronic diastolic dysfunction grade 2.  EF 55 to 60% on echocardiogram done this month at [facility].  Achieved IV Lasix upon admission on 06/05/2018 we will continue to monitor fluid and Lasix need.  5. Hypothyroidism.  Resume home dose Synthroid which appears to be 112 mcg.  6.  Anemia of chronic disease - check anemia panel, we will type screen and transfuse 1 unit in the light of acute renal failure.  No signs of active bleeding, monitor.  7.  DM II.  Check A1c, sliding scale for now.  CBG (last 3)  Recent Labs    06/05/18 0758 06/05/18 1200 06/06/18 0922  GLUCAP 92 105* 101*     Family Communication  :  Son bedside  Code Status :  Full  Disposition Plan  :  SNF  Consults  :  N.Surg, ID  Procedures  :    06/06/18 - Exploration, debridement, and closure of lumbar wound  DVT Prophylaxis  :    Heparin   Lab Results  Component Value Date   PLT 352 06/06/2018    Diet :  Diet Order            Diet NPO time specified  Diet effective now               Inpatient Medications Scheduled Meds: . sodium chloride    Intravenous Once  . amLODipine  10 mg Oral Daily  . carvedilol  25 mg Oral BID WC  . cloNIDine  0.1 mg Oral TID  . heparin  5,000 Units Subcutaneous Q8H  . isosorbide mononitrate  60 mg Oral Daily  . levothyroxine  112 mcg Oral Q0600  . sodium chloride flush  3 mL Intravenous Q12H   Continuous Infusions: . meropenem (MERREM) IV 1 g (06/06/18 0055)  . vancomycin 1,000 mg (06/05/18 1832)   PRN Meds:.diphenhydrAMINE, HYDROcodone-acetaminophen, metoprolol tartrate, morphine injection  Antibiotics  :   Anti-infectives (From admission, onward)   Start     Dose/Rate Route Frequency Ordered Stop   06/06/18 0837  bacitracin 50,000 Units in sodium chloride 0.9 % 500 mL irrigation  Status:  Discontinued       As needed 06/06/18 0838 06/06/18 0917   06/05/18 2200  meropenem (MERREM) 1 g in sodium chloride 0.9 % 100 mL IVPB     1 g 200 mL/hr over 30 Minutes Intravenous Every 12 hours 06/05/18 1442     06/05/18 1800  vancomycin (VANCOCIN) IVPB 1000 mg/200 mL premix     1,000 mg 200 mL/hr over 60 Minutes Intravenous Every 24 hours 06/05/18 1440            Objective:   Vitals:   06/06/18 1016 06/06/18 1020 06/06/18 1024 06/06/18 1038  BP: (!) 191/83   (!) 207/81  Pulse: 67  69 71  Resp: (!) 25  (!) 28   Temp:  (!) 97 F (36.1 C)  97.7 F (36.5 C)  TempSrc:    Oral  SpO2: 99%  100% 100%  Weight:  Wt Readings from Last 3 Encounters:  06/05/18 106 kg  06/05/18 106.3 kg  04/19/18 99.9 kg     Intake/Output Summary (Last 24 hours) at 06/06/2018 1142 Last data filed at 06/06/2018 0900 Gross per 24 hour  Intake 1423 ml  Output 1500 ml  Net -77 ml     Physical Exam  Awake Alert, Oriented X 3, No new F.N deficits, L.leg weaker than the right, Normal affect [place].AT,PERRAL Supple Neck,No JVD, No cervical lymphadenopathy appriciated.  Symmetrical Chest wall movement, Good air movement bilaterally, CTAB RRR,No Gallops,Rubs or new Murmurs, No Parasternal Heave +ve B.Sounds,  Abd Soft, No tenderness, No organomegaly appriciated, No rebound - guarding or rigidity. No Cyanosis, Back post op site stable, drain in place    Data Review:    CBC Recent Labs  Lab 06/04/18 0245 06/04/18 1315 06/05/18 0522 06/05/18 1455 06/06/18 0314  WBC 17.7* 18.8* 15.6* 15.9* 15.0*  HGB 7.7* 6.8* 7.0* 7.0* 8.1*  HCT 24.8* 21.3* 21.9* 22.3* 25.3*  PLT 456* 355 367 383 352  MCV 84.6 81.3 83.6 83.5 81.4  MCH 26.3 26.0 26.7 26.2 26.0  MCHC 31.0 31.9 32.0 31.4 32.0  RDW 15.9* 15.9* 16.1* 16.0* 15.3  LYMPHSABS 1.2  --   --   --   --   MONOABS 0.6  --   --   --   --   EOSABS 0.1  --   --   --   --   BASOSABS 0.0  --   --   --   --     Chemistries  Recent Labs  Lab 06/04/18 0245 06/04/18 1315 06/05/18 0522 06/05/18 1455 06/06/18 0314  NA 138 137 138 136 136  K 3.9 4.3 3.9 3.6 4.0  CL 104 104 104 105 107  CO2 25 24 25 25 23   GLUCOSE 114* 199* 115* 112* 92  BUN 58* 51* 52* 46* 45*  CREATININE 2.45* 2.36* 2.23* 2.19* 2.10*  CALCIUM 8.7* 8.3* 8.1* 8.3* 8.3*  MG  --   --   --   --  1.8  AST 17  --   --  15 13*  ALT 12  --   --  11 11  ALKPHOS 75  --   --  58 60  BILITOT 0.6  --   --  0.6 0.6   ------------------------------------------------------------------------------------------------------------------ No results for input(s): CHOL, HDL, LDLCALC, TRIG, CHOLHDL, LDLDIRECT in the last 72 hours.  Lab Results  Component Value Date   HGBA1C 6.4 (H) 06/05/2018   ------------------------------------------------------------------------------------------------------------------ Recent Labs    06/04/18 0636  TSH 2.155  T3FREE 1.3*   ------------------------------------------------------------------------------------------------------------------ Recent Labs    06/04/18 0636 06/05/18 1455  VITAMINB12  --  1,085*  FOLATE  --  8.8  FERRITIN 579* 714*  TIBC 177* 153*  IRON 18* 29  RETICCTPCT  --  1.1    Coagulation profile Recent Labs  Lab 06/04/18 0636  06/05/18 1455  INR 1.38 1.45    No results for input(s): DDIMER in the last 72 hours.  Cardiac Enzymes Recent Labs  Lab 06/04/18 0636 06/04/18 1315 06/04/18 2316  TROPONINI 0.03* 0.07* 0.03*   ------------------------------------------------------------------------------------------------------------------    Component Value Date/Time   BNP 1,059.0 (H) 06/04/2018 0245    Micro Results Recent Results (from the past 240 hour(s))  Aerobic/Anaerobic Culture (surgical/deep wound)     Status: None (Preliminary result)   Collection Time: 06/04/18  6:11 AM  Result Value Ref Range Status   Specimen Description   Final  BACK Performed at [facility], [address] [place], [place] [address]    Special Requests   Final    Normal Performed at [facility], [place], [place] [address]    Gram Stain   Final    RARE WBC PRESENT, PREDOMINANTLY PMN RARE GRAM POSITIVE RODS RARE GRAM POSITIVE COCCI    Culture   Final    CULTURE REINCUBATED FOR BETTER GROWTH Performed at [facility], [place] [address] [place], [place] [address]    Report Status PENDING  Incomplete  MRSA PCR Screening     Status: None   Collection Time: 06/04/18  6:13 AM  Result Value Ref Range Status   MRSA by PCR NEGATIVE NEGATIVE Final    Comment:        The GeneXpert MRSA Assay (FDA approved for NASAL specimens only), is one component of a comprehensive MRSA colonization surveillance program. It is not intended to diagnose MRSA infection nor to guide or monitor treatment for MRSA infections. Performed at [facility], [address] [place], [place] [address]   Culture, blood (Routine X 2) w Reflex to ID Panel     Status: None (Preliminary result)   Collection Time: 06/04/18 11:15 PM  Result Value Ref Range Status   Specimen Description BLOOD RIGHT HAND  Final   Special Requests   Final    BOTTLES DRAWN AEROBIC AND ANAEROBIC Blood Culture adequate  volume   Culture   Final    NO GROWTH 2 DAYS Performed at [facility], [address] [place], [place] [address]    Report Status PENDING  Incomplete  Culture, blood (Routine X 2) w Reflex to ID Panel     Status: None (Preliminary result)   Collection Time: 06/04/18 11:17 PM  Result Value Ref Range Status   Specimen Description BLOOD LEFT ANTECUBITAL  Final   Special Requests   Final    BOTTLES DRAWN AEROBIC AND ANAEROBIC Blood Culture adequate volume   Culture   Final    NO GROWTH 2 DAYS Performed at [facility], [address], [place],  [address]    Report Status PENDING  Incomplete  MRSA PCR Screening     Status: None   Collection Time: 06/05/18  4:37 PM  Result Value Ref Range Status   MRSA by PCR NEGATIVE NEGATIVE Final    Comment:        The GeneXpert MRSA Assay (FDA approved for NASAL specimens only), is one component of a comprehensive MRSA colonization surveillance program. It is not intended to diagnose MRSA infection nor to guide or monitor treatment for MRSA infections. Performed at [facility], [place] [address] [place],  [address]     Radiology Reports Dg Chest 2 View  Result Date: 06/04/2018 CLINICAL DATA:  77 year old female with shortness of breath. EXAM: CHEST - 2 VIEW COMPARISON:  Chest radiograph dated 03/29/2017 FINDINGS: There is moderate cardiomegaly. Minimal vascular congestion. No edema. No focal consolidation, pleural effusion, or pneumothorax. No acute osseous pathology. IMPRESSION: Moderate cardiomegaly with minimal vascular congestion. No edema or focal consolidation. Electronically Signed   By: Anner Crete M.D.   On: 06/04/2018 04:01   Dg Lumbar Spine Complete  Result Date: 06/04/2018 CLINICAL DATA:  77 year old female with chronic lower back pain. EXAM: LUMBAR SPINE - COMPLETE 4+ VIEW COMPARISON:  Lumbar spine MRI dated 03/30/2018 FINDINGS: There is no acute fracture or subluxation of the lumbar  spine. There is  osteopenia with multilevel degenerative changes. L3-S1 disc spacers and posterior fixation rod and screws as well as L5-S1 anterior fusion noted. Overall these findings are similar to the radiograph of 03/09/2018. Apparent large pocket of air along the posterior lower lumbar spine at L4-S1 and along the fixation hardware likely represents superimposed bowel. Clinical correlation is recommended. There is atherosclerotic calcification of the aorta. IMPRESSION: 1. No acute fracture or subluxation of the lumbar spine. Stable postoperative changes. 2. A pocket of air over the hardware, likely superimposed bowel. Electronically Signed   By: Anner Crete M.D.   On: 06/04/2018 04:09   Mr Lumbar Spine Wo Contrast  Result Date: 06/04/2018 CLINICAL DATA:  Fluid leaking from spinal wound incision, history of multiple lumbar spine surgeries. Status post lumbar spine wound debridement March 30, 2017. EXAM: MRI LUMBAR SPINE WITHOUT CONTRAST TECHNIQUE: Multiplanar, multisequence MR imaging of the lumbar spine was performed. No intravenous contrast was administered. COMPARISON:  Lumbar spine radiographs June 04, 2018 and MRI lumbar spine March 30, 2018 FINDINGS: Moderately motion degraded examination. SEGMENTATION: For the purposes of this report, the last well-formed intervertebral disc is reported as L5-S1. ALIGNMENT: Maintained lumbar lordosis. Minimal grade 1 L2-3 retrolisthesis. No spondylolysis. VERTEBRAE:Status post L3 through S1 PLIF, susceptibility artifact from hardware. Mild L1-2 disc height loss with mild disc desiccation. No suspicious or acute bone marrow signal the limited by hardware artifact and patient motion. CONUS MEDULLARIS AND CAUDA EQUINA: Conus medullaris terminates at L1-2 and demonstrates normal morphology and signal characteristics. Limited assessment of cauda equina due to hardware artifact common no nerve root clumping. PARASPINAL AND OTHER SOFT TISSUES: Multiple air-fluid  levels within the paraspinal muscles and subcutaneous fat along surgical approach and within surgical bed. Postoperative paraspinal muscle denervation with superimposed RIGHT interstitial STIR signal. DISC LEVELS (limited assessment due to hardware artifact and motion): L1-2: Small broad-based disc bulge, mild facet arthropathy and ligamentum flavum redundancy. No canal stenosis. Mild bilateral neural foraminal narrowing. L2-3: Retrolisthesis. Moderate broad-based disc bulge. Moderate canal stenosis. Mild neural foraminal narrowing may be overestimated by hardware artifact. L3-4: PLIF, posterior decompression. No canal stenosis or definite neural foraminal narrowing. L4-5: PLIF and posterior decompression. No canal stenosis. Moderate possible LEFT neural foraminal narrowing, potentially overestimated by hardware artifact. L5-S1: PLIF and posterior decompression. No canal stenosis. Small suspected central disc extrusion, stable from prior examination. No definite neural foraminal narrowing. IMPRESSION: 1. Air-fluid levels within paraspinal muscles and subcutaneous fat along surgical approach and within surgical bed, query recent intervention, alternatively this could reflect infection. Additional paraspinal muscle denervation versus myositis. 2. Status post L3 through S1 PLIF and posterior decompression. Minimal grade 1 L2-3 retrolisthesis. 3. Moderate canal stenosis L2-3. Multilevel suspected neural foraminal narrowing, potentially moderate on the LEFT at L4-5. 4. These results will be called to the ordering clinician or representative by the Radiologist Assistant, and communication documented in the PACS or zVision Dashboard. Electronically Signed   By: Elon Alas M.D.   On: 06/04/2018 23:14   US Renal  Result Date: 06/04/2018 CLINICAL DATA:  Acute renal failure EXAM: RENAL / URINARY TRACT ULTRASOUND COMPLETE COMPARISON:  CT 03/29/2017 FINDINGS: Right Kidney: Renal measurements: 9.6 x 5.7 x 5.2 cm =  volume: 142 mL. Diffusely increased echotexture. No mass or hydronephrosis. Left Kidney: Renal measurements: 9.1 x 4.4 x 3.4 cm = volume: 71 mL. Diffusely increased echotexture. No mass or hydronephrosis. Bladder: Not visualized, decompressed with Foley catheter in place. IMPRESSION: Atrophic, echogenic kidneys compatible with chronic medical renal disease. No hydronephrosis. Electronically  Signed   By: Rolm Baptise M.D.   On: 06/04/2018 09:48   Dg Chest Port 1 View  Result Date: 06/05/2018 CLINICAL DATA:  SOB. Best obtainable image EXAM: PORTABLE CHEST 1 VIEW COMPARISON:  Radiograph 06/04/2018 FINDINGS: Stable enlarged cardiac silhouette. Central venous pulmonary congestion. No effusion, infiltrate pneumothorax. IMPRESSION: Cardiomegaly and central venous congestion.  No interval change. Electronically Signed   By: Suzy Bouchard M.D.   On: 06/05/2018 14:54    Time Spent in minutes  30   Lala Lund M.D on 06/06/2018 at 11:42 AM  To page go to [email or web address] - password [facility]

## 2018-06-06 NOTE — Progress Notes (Signed)
Please see consult note by Ferne Reus on 06/05/2018

## 2018-06-06 NOTE — Progress Notes (Signed)
[name], female   DOB: 06-25-1941, 77 y.o.   MRN: [id number]          [place] [facility]    Date of Admission:  06/05/2018   Day 2 vancomycin        Day 2 meropenem  [name] underwent incision and drainage of a large subcutaneous abscess pocket.  There was a small opening noted in the fascia below.  Operative Gram stain is pending.  Cultures of superficial wound drainage are pending.  I will continue current antibiotics for now.         Michel Bickers, MD [facility] [place] [facility] [phone] pager   [phone] cell 06/06/2018, 12:08 PM

## 2018-06-06 NOTE — Op Note (Signed)
  NEUROSURGERY OPERATIVE NOTE   PREOP DIAGNOSIS:  1. Wound infection   POSTOP DIAGNOSIS: Same  PROCEDURE: 1. Exploration, debridement, and closure of lumbar wound  SURGEON: Dr. Consuella Lose, MD  ASSISTANT: None  ANESTHESIA: General Endotracheal  EBL: 50 cc  SPECIMENS: Subcutaneous culture swabs for aerobic and anaerobic culture  DRAINS: Subcutaneous Hemovac drain  COMPLICATIONS: None  CONDITION: Hemodynamically stable to postanesthesia care unit  HISTORY: ZO LOUDON is a 77 y.o. female with a history of prior lumbar decompression and fusion surgery about a year and a half ago.  Her initial postoperative course was complicated by infection for which she did undergo previous wound washout.  She was admitted to [facility] hospital with generalized malaise and drainage from her wound.  She was therefore transferred to [facility] for possible surgical treatment.  Risks and benefits of surgery for wound debridement and closure were reviewed in detail with the patient.  After all her questions were answered informed consent was obtained and witnessed.  PROCEDURE IN DETAIL: The patient was brought to the operating room. After induction of general anesthesia, the patient was positioned on the operative table in the prone position. All pressure points were meticulously padded.  Skin of the low back including the open portion of the incision were then prepped with Betadine scrub and paint, and the area was draped in usual sterile fashion.  After timeout was conducted, the open portion of the incision was extended superiorly and inferiorly with 15 blade scalpel.  Bovie electrocautery was used to dissected subcutaneous tissue.  Immediately subjacent to the opening, there was a large open subcutaneous space.  There was a small amount of purulent fluid, however no large pocket of pus was identified.  Using finger dissection, I was able to identify a fairly large subcutaneous pocket on  the patient's left side which tracked laterally almost to the level of the flank.  There was also a smaller subcutaneous pocket inferiorly in the midline.  Culture swabs were taken of the subcutaneous pocket and sent for aerobic and anaerobic culture.  At this point I did identify a small approximately 1 cm opening in the lumbodorsal fascia on the right side.  I extended this with the Bovie for length of approximately 4 cm.  Subfascial space was inspected and did not appear to contain any purulent material.  At this point, the wound was irrigated with approximately 3 L of normal saline irrigation.  The wound was sharply debrided using a knife, scissors, and the Bovie of any devitalized tissue.  The borders of the subcutaneous pocket actually appeared to have excellent granulation tissue.  After irrigation, I closed the fascial opening with a combination of interrupted 0 Vicryl stitches.  A large Hemovac drain was then placed in the subcutaneous pocket and tunneled subcutaneously away from the incision.  I then placed multiple 0 Vicryl stitches in the deep subcutaneous space.  The wound was then closed with an interrupted 0 Prolene vertical mattress stitch.  The drain was secured in place.  Bacitracin ointment and sterile dressing was applied.  At the end of the case all sponge, needle, instrument, and cottonoid counts were correct. The patient was then transferred to the stretcher, extubated, and taken to the post-anesthesia care unit in stable hemodynamic condition.

## 2018-06-06 NOTE — Anesthesia Procedure Notes (Signed)
Procedure Name: Intubation Date/Time: 06/06/2018 7:57 AM Performed by: Renato Shin, CRNA Pre-anesthesia Checklist: Patient identified, Emergency Drugs available, Suction available and Patient being monitored Patient Re-evaluated:Patient Re-evaluated prior to induction Oxygen Delivery Method: Circle system utilized Preoxygenation: Pre-oxygenation with 100% oxygen Induction Type: IV induction Ventilation: Mask ventilation without difficulty Laryngoscope Size: Mac and 3 Grade View: Grade II Tube type: Oral Tube size: 7.0 mm Number of attempts: 1 Airway Equipment and Method: Stylet Placement Confirmation: ETT inserted through vocal cords under direct vision,  positive ETCO2,  CO2 detector and breath sounds checked- equal and bilateral Secured at: 21 cm Tube secured with: Tape Dental Injury: Teeth and Oropharynx as per pre-operative assessment

## 2018-06-06 NOTE — Transfer of Care (Signed)
Immediate Anesthesia Transfer of Care Note  Patient: Taylor Tyler  Procedure(s) Performed: LUMBAR WOUND DEBRIDEMENT/EXPLORATION (N/A Back)  Patient Location: PACU  Anesthesia Type:General  Level of Consciousness: awake, alert  and patient cooperative  Airway & Oxygen Therapy: Patient Spontanous Breathing and Patient connected to nasal cannula oxygen  Post-op Assessment: Report given to RN and Post -op Vital signs reviewed and stable  Post vital signs: Reviewed and stable  Last Vitals:  Vitals Value Taken Time  BP 215/83 06/06/2018  9:22 AM  Temp    Pulse 75 06/06/2018  9:22 AM  Resp 22 06/06/2018  9:22 AM  SpO2 99 % 06/06/2018  9:22 AM  Vitals shown include unvalidated device data.  Last Pain:  Vitals:   06/06/18 0300  TempSrc: Oral  PainSc:       Patients Stated Pain Goal: 0 (26/71/24 5809)  Complications: No apparent anesthesia complications

## 2018-06-06 NOTE — Anesthesia Postprocedure Evaluation (Signed)
Anesthesia Post Note  Patient: Taylor Tyler  Procedure(s) Performed: LUMBAR WOUND DEBRIDEMENT/EXPLORATION (N/A Back)     Patient location during evaluation: PACU Anesthesia Type: General Level of consciousness: sedated Pain management: pain level controlled Vital Signs Assessment: post-procedure vital signs reviewed and stable Respiratory status: spontaneous breathing and respiratory function stable Cardiovascular status: stable Postop Assessment: no apparent nausea or vomiting Anesthetic complications: no    Last Vitals:  Vitals:   06/06/18 1024 06/06/18 1038  BP:  (!) 207/81  Pulse: 69 71  Resp: (!) 28   Temp:  36.5 C  SpO2: 100% 100%    Last Pain:  Vitals:   06/06/18 1038  TempSrc: Oral  PainSc: 6                  Joslyne Marshburn DANIEL

## 2018-06-07 ENCOUNTER — Encounter (HOSPITAL_COMMUNITY): Payer: Self-pay | Admitting: Neurosurgery

## 2018-06-07 ENCOUNTER — Inpatient Hospital Stay (HOSPITAL_COMMUNITY): Payer: Medicare Other

## 2018-06-07 ENCOUNTER — Ambulatory Visit: Payer: Self-pay | Admitting: Gastroenterology

## 2018-06-07 DIAGNOSIS — D631 Anemia in chronic kidney disease: Secondary | ICD-10-CM

## 2018-06-07 DIAGNOSIS — Z6841 Body Mass Index (BMI) 40.0 and over, adult: Secondary | ICD-10-CM

## 2018-06-07 DIAGNOSIS — I129 Hypertensive chronic kidney disease with stage 1 through stage 4 chronic kidney disease, or unspecified chronic kidney disease: Secondary | ICD-10-CM

## 2018-06-07 DIAGNOSIS — E1122 Type 2 diabetes mellitus with diabetic chronic kidney disease: Secondary | ICD-10-CM

## 2018-06-07 DIAGNOSIS — N183 Chronic kidney disease, stage 3 (moderate): Secondary | ICD-10-CM

## 2018-06-07 DIAGNOSIS — B964 Proteus (mirabilis) (morganii) as the cause of diseases classified elsewhere: Secondary | ICD-10-CM

## 2018-06-07 DIAGNOSIS — D72829 Elevated white blood cell count, unspecified: Secondary | ICD-10-CM

## 2018-06-07 DIAGNOSIS — T8149XD Infection following a procedure, other surgical site, subsequent encounter: Secondary | ICD-10-CM

## 2018-06-07 LAB — CBC
HCT: 25.7 % — ABNORMAL LOW (ref 36.0–46.0)
Hemoglobin: 7.9 g/dL — ABNORMAL LOW (ref 12.0–15.0)
MCH: 25.5 pg — ABNORMAL LOW (ref 26.0–34.0)
MCHC: 30.7 g/dL (ref 30.0–36.0)
MCV: 82.9 fL (ref 80.0–100.0)
PLATELETS: 386 10*3/uL (ref 150–400)
RBC: 3.1 MIL/uL — ABNORMAL LOW (ref 3.87–5.11)
RDW: 15.9 % — AB (ref 11.5–15.5)
WBC: 21.6 10*3/uL — AB (ref 4.0–10.5)
nRBC: 0 % (ref 0.0–0.2)

## 2018-06-07 LAB — COMPREHENSIVE METABOLIC PANEL
ALBUMIN: 1.8 g/dL — AB (ref 3.5–5.0)
ALT: 11 U/L (ref 0–44)
AST: 13 U/L — AB (ref 15–41)
Alkaline Phosphatase: 58 U/L (ref 38–126)
Anion gap: 5 (ref 5–15)
BUN: 47 mg/dL — ABNORMAL HIGH (ref 8–23)
CHLORIDE: 105 mmol/L (ref 98–111)
CO2: 25 mmol/L (ref 22–32)
Calcium: 8.7 mg/dL — ABNORMAL LOW (ref 8.9–10.3)
Creatinine, Ser: 1.94 mg/dL — ABNORMAL HIGH (ref 0.44–1.00)
GFR calc Af Amer: 28 mL/min — ABNORMAL LOW (ref >60)
GFR, EST NON AFRICAN AMERICAN: 24 mL/min — AB (ref >60)
Glucose, Bld: 136 mg/dL — ABNORMAL HIGH (ref 70–99)
POTASSIUM: 4.4 mmol/L (ref 3.5–5.1)
SODIUM: 135 mmol/L (ref 135–145)
Total Bilirubin: 0.7 mg/dL (ref 0.3–1.2)
Total Protein: 6.6 g/dL (ref 6.5–8.1)

## 2018-06-07 LAB — MAGNESIUM: Magnesium: 1.9 mg/dL (ref 1.7–2.4)

## 2018-06-07 LAB — GLUCOSE, CAPILLARY: GLUCOSE-CAPILLARY: 169 mg/dL — AB (ref 70–99)

## 2018-06-07 MED ORDER — HYDRALAZINE HCL 50 MG PO TABS
50.0000 mg | ORAL_TABLET | Freq: Three times a day (TID) | ORAL | Status: DC
  Administered 2018-06-07 – 2018-06-12 (×14): 50 mg via ORAL
  Filled 2018-06-07 (×14): qty 1

## 2018-06-07 MED ORDER — HYDRALAZINE HCL 50 MG PO TABS: 50.0000 mg | ORAL_TABLET | Freq: Three times a day (TID) | ORAL | Status: DC

## 2018-06-07 MED ORDER — LABETALOL HCL 5 MG/ML IV SOLN
10.0000 mg | Freq: Four times a day (QID) | INTRAVENOUS | Status: DC | PRN
  Administered 2018-06-07: 10 mg via INTRAVENOUS
  Filled 2018-06-07: qty 4

## 2018-06-07 NOTE — Progress Notes (Addendum)
  NEUROSURGERY PROGRESS NOTE   No issues overnight. Complains of appropriate back soreness. Pain well controlled.  EXAM:  BP (!) 193/85 (BP Location: Right Arm)   Pulse 70   Temp 97.6 F (36.4 C) (Oral)   Resp 20   Wt 106 kg   SpO2 100%   BMI 42.74 kg/m   Awake, alert, oriented  Speech fluent, appropriate  CN grossly intact  MAEW with good strength. Mild LLE weakness Minimal dried blood on bandage. No active drianage.   IMPRESSION/PLAN 77 y.o. female pod #1 s/p lumbar wound exploration, debridement and closure of open wound. Neurologically stable. No drainage from wound. - No new NS recs - Will cont hemovac drain for now - Continue current care per primary team and ID

## 2018-06-07 NOTE — Progress Notes (Signed)
Pt's BP remains high even with PRN BP meds. Saw note about pt being allergic to Hydralazine and that we are to gradually lower BP. Spoke w/ pt questioning her reaction to Hydralazine. She stated her chest gets tight and she felt like she was dying. Passed information to dayshift RN. Pt also stated she takes Lasix at home BID but has not been receiving during hospital stay. Pt is looking more swollen than previous. Info given to dayshift.

## 2018-06-07 NOTE — Social Work (Signed)
CSW acknowledging consult for SNF placement, per PT/OT notes pt appropriate for home health services. Has support at home, functioning at supervision level.  Westley Hummer, MSW, [place] [employer] [phone]

## 2018-06-07 NOTE — Plan of Care (Signed)

## 2018-06-07 NOTE — Progress Notes (Signed)
PROGRESS NOTE                                                                                                                                                                                                             Patient Demographics:    Taylor Tyler, is a 77 y.o. female, DOB - Aug 30, 1940, IOE:[id number]  Admit date - 06/05/2018   Admitting Physician Guilford Shi, MD  Outpatient Primary MD for the patient is McLean-Scocuzza, Nino Glow, MD  LOS - 2  CC back pain     Brief Narrative    Taylor Tyler  is a 77 y.o. AA female with H/O Morbid Obesity, CKD 3, DM2, HTN, anemia of chronic disease, fatty liver, hypothyroidism, status post L spine surgery along with L5-S1 fusion done in July 2018 at [facility] by Dr. Cyndy Freeze, lives in [facility] by herself and presented to [facility] hospital few days ago with 1 month duration of low back pain mostly on the left side radiating down to her left leg and some discharge from her postop back scar, at the hospital she was diagnosed with postop site infection and abscess with pus drainage, poorly controlled blood pressure, she was admitted to the hospital seen by ID, her case was discussed by neurosurgery at [facility] and then she was transferred here for further care.   Subjective:   Patient in bed, appears comfortable, denies any headache, no fever, no chest pain or pressure, no shortness of breath , no abdominal pain. No focal weakness. L sided low back pain is better.   Assessment  & Plan :     1. Post op L.Spine Abscess - with MRI showing air-fluid levels within the paraspinal muscles and subcutaneous fat along the surgical approach and within the surgical bed, currently does have leukocytosis however no evidence of frank sepsis.    This likely is subacute to chronic infection, she has been placed on broad-spectrum IV vancomycin and meropenem, ID and neurosurgery are following the patient, she underwent  incision and drainage by neurosurgery on 06/06/2018.  Drain in place.  Still has mild low back pain and continues to have some left leg weakness.  Commence PT will most likely require SNF and prolonged antibiotics.  May require PICC line as well.  Cultures thus far negative.  Continue to follow intraoperative cultures.  2.  Poorly controlled hypertension.  Already increased Coreg and Catapres dose, continue Imdur and Norvasc, hydralazine allergy reviewed by me and pharmacy, likely  no serious allergy, order hydralazine ordered with PRN Benadryl on board in case she has any adverse effects which seems less likely, informed the nurse twice on 06/07/2018, also mentioned allergy discussion in the note from 06/06/2018.  3.  ARF on CKD 4.  Baseline creatinine is 1.6.  Renal ultrasound shows CKD, continue to monitor urine output and renal function.  Creatinine peaked around 2.4 and now gradually trending down.  4.  Mild acute on chronic diastolic dysfunction grade 2.  EF 55 to 60% on echocardiogram done this month at [facility].  Achieved IV Lasix upon admission on 06/05/2018 we will continue to monitor fluid and Lasix need.  5. Hypothyroidism.  Resume home dose Synthroid which appears to be 112 mcg.  6.  Anemia of chronic disease - stable anemia panel, received 1 unit of packed RBC transfusion on 06/05/2018.  No signs of active bleeding, H&H remained stable will continue to monitor.  7.  Mild post op Leukocytosis - afebrile and nonspecific, is on good antibiotics for #1 above, chest x-ray remains stable postop and will add I-S.  Will monitor the trend.  8. DM II.  Check A1c, sliding scale for now.  CBG (last 3)  Recent Labs    06/05/18 1200 06/06/18 0922 06/07/18 0823  GLUCAP 105* 101* 169*     Family Communication  :  Son bedside  Code Status :  Full  Disposition Plan  :  SNF  Consults  :  N.Surg, ID  Procedures  :    06/06/18 - Exploration, debridement, and closure of lumbar wound  DVT  Prophylaxis  :    Heparin   Lab Results  Component Value Date   PLT 386 06/07/2018    Diet :  Diet Order            Diet Heart Room service appropriate? Yes; Fluid consistency: Thin  Diet effective now               Inpatient Medications Scheduled Meds: . sodium chloride   Intravenous Once  . amLODipine  10 mg Oral Daily  . carvedilol  25 mg Oral BID WC  . cloNIDine  0.1 mg Oral TID  . heparin  5,000 Units Subcutaneous Q8H  . isosorbide mononitrate  60 mg Oral Daily  . levothyroxine  112 mcg Oral Q0600  . sodium chloride flush  3 mL Intravenous Q12H   Continuous Infusions: . meropenem (MERREM) IV 500 mg (06/07/18 1028)  . vancomycin 1,000 mg (06/06/18 1819)   PRN Meds:.diphenhydrAMINE, HYDROcodone-acetaminophen, labetalol, morphine injection  Antibiotics  :   Anti-infectives (From admission, onward)   Start     Dose/Rate Route Frequency Ordered Stop   06/06/18 2200  meropenem (MERREM) 500 mg in sodium chloride 0.9 % 100 mL IVPB     500 mg 200 mL/hr over 30 Minutes Intravenous Every 12 hours 06/06/18 1349     06/06/18 0837  bacitracin 50,000 Units in sodium chloride 0.9 % 500 mL irrigation  Status:  Discontinued       As needed 06/06/18 0838 06/06/18 0917   06/05/18 2200  meropenem (MERREM) 1 g in sodium chloride 0.9 % 100 mL IVPB  Status:  Discontinued     1 g 200 mL/hr over 30 Minutes Intravenous Every 12 hours 06/05/18 1442 06/06/18 1349   06/05/18 1800  vancomycin (VANCOCIN) IVPB 1000 mg/200 mL premix     1,000 mg 200 mL/hr over 60 Minutes Intravenous Every 24 hours 06/05/18 1440  Objective:   Vitals:   06/07/18 0105 06/07/18 0300 06/07/18 0800 06/07/18 1014  BP: (!) 179/83 (!) 193/85  (!) 209/63  Pulse: (!) 47 70    Resp:      Temp:  97.6 F (36.4 C) 97.8 F (36.6 C)   TempSrc:  Oral    SpO2: 100% 100%    Weight:        Wt Readings from Last 3 Encounters:  06/05/18 106 kg  06/05/18 106.3 kg  04/19/18 99.9 kg     Intake/Output  Summary (Last 24 hours) at 06/07/2018 1037 Last data filed at 06/07/2018 0900 Gross per 24 hour  Intake 240 ml  Output 1400 ml  Net -1160 ml     Physical Exam  Awake Alert, Oriented X 3, No new F.N deficits, Left leg remains weaker than the right, Normal affect [place].AT,PERRAL Supple Neck,No JVD, No cervical lymphadenopathy appriciated.  Symmetrical Chest wall movement, Good air movement bilaterally, CTAB RRR,No Gallops, Rubs or new Murmurs, No Parasternal Heave +ve B.Sounds, Abd Soft, No tenderness, No organomegaly appriciated, No rebound - guarding or rigidity. No Cyanosis, Clubbing or edema,  Back post op site stable, drain in place    Data Review:    CBC  Recent Labs  Lab 06/04/18 0245 06/04/18 1315 06/05/18 0522 06/05/18 1455 06/06/18 0314 06/07/18 0432  WBC 17.7* 18.8* 15.6* 15.9* 15.0* 21.6*  HGB 7.7* 6.8* 7.0* 7.0* 8.1* 7.9*  HCT 24.8* 21.3* 21.9* 22.3* 25.3* 25.7*  PLT 456* 355 367 383 352 386  MCV 84.6 81.3 83.6 83.5 81.4 82.9  MCH 26.3 26.0 26.7 26.2 26.0 25.5*  MCHC 31.0 31.9 32.0 31.4 32.0 30.7  RDW 15.9* 15.9* 16.1* 16.0* 15.3 15.9*  LYMPHSABS 1.2  --   --   --   --   --   MONOABS 0.6  --   --   --   --   --   EOSABS 0.1  --   --   --   --   --   BASOSABS 0.0  --   --   --   --   --     Chemistries  Recent Labs  Lab 06/04/18 0245 06/04/18 1315 06/05/18 0522 06/05/18 1455 06/06/18 0314 06/06/18 1129 06/07/18 0432  NA 138 137 138 136 136  --  135  K 3.9 4.3 3.9 3.6 4.0  --  4.4  CL 104 104 104 105 107  --  105  CO2 25 24 25 25 23   --  25  GLUCOSE 114* 199* 115* 112* 92  --  136*  BUN 58* 51* 52* 46* 45*  --  47*  CREATININE 2.45* 2.36* 2.23* 2.19* 2.10*  --  1.94*  CALCIUM 8.7* 8.3* 8.1* 8.3* 8.3*  --  8.7*  MG  --   --   --   --  1.8 1.8 1.9  AST 17  --   --  15 13*  --  13*  ALT 12  --   --  11 11  --  11  ALKPHOS 75  --   --  58 60  --  58  BILITOT 0.6  --   --  0.6 0.6  --  0.7    ------------------------------------------------------------------------------------------------------------------ No results for input(s): CHOL, HDL, LDLCALC, TRIG, CHOLHDL, LDLDIRECT in the last 72 hours.  Lab Results  Component Value Date   HGBA1C 6.4 (H) 06/05/2018   ------------------------------------------------------------------------------------------------------------------ No results for input(s): TSH, T4TOTAL, T3FREE, THYROIDAB in the last 72  hours.  Invalid input(s): FREET3 ------------------------------------------------------------------------------------------------------------------ Recent Labs    06/05/18 1455  VITAMINB12 1,085*  FOLATE 8.8  FERRITIN 714*  TIBC 153*  IRON 29  RETICCTPCT 1.1    Coagulation profile Recent Labs  Lab 06/04/18 0636 06/05/18 1455  INR 1.38 1.45    No results for input(s): DDIMER in the last 72 hours.  Cardiac Enzymes Recent Labs  Lab 06/04/18 0636 06/04/18 1315 06/04/18 2316  TROPONINI 0.03* 0.07* 0.03*   ------------------------------------------------------------------------------------------------------------------    Component Value Date/Time   BNP 1,059.0 (H) 06/04/2018 0245    Micro Results Recent Results (from the past 240 hour(s))  Aerobic/Anaerobic Culture (surgical/deep wound)     Status: None (Preliminary result)   Collection Time: 06/04/18  6:11 AM  Result Value Ref Range Status   Specimen Description   Final    BACK Performed at [facility], [address], [place], [place] [address]    Special Requests   Final    Normal Performed at [facility], [address] [place], [place] [address]    Gram Stain   Final    RARE WBC PRESENT, PREDOMINANTLY PMN RARE GRAM POSITIVE RODS RARE GRAM POSITIVE COCCI Performed at [facility], [place] [address] [place], [place] [address]    Culture   Final    CULTURE REINCUBATED FOR BETTER GROWTH NO ANAEROBES ISOLATED; CULTURE IN  PROGRESS FOR 5 DAYS    Report Status PENDING  Incomplete  MRSA PCR Screening     Status: None   Collection Time: 06/04/18  6:13 AM  Result Value Ref Range Status   MRSA by PCR NEGATIVE NEGATIVE Final    Comment:        The GeneXpert MRSA Assay (FDA approved for NASAL specimens only), is one component of a comprehensive MRSA colonization surveillance program. It is not intended to diagnose MRSA infection nor to guide or monitor treatment for MRSA infections. Performed at [facility], [address] [place], [place] [address]   Culture, blood (Routine X 2) w Reflex to ID Panel     Status: None (Preliminary result)   Collection Time: 06/04/18 11:15 PM  Result Value Ref Range Status   Specimen Description BLOOD RIGHT HAND  Final   Special Requests   Final    BOTTLES DRAWN AEROBIC AND ANAEROBIC Blood Culture adequate volume   Culture   Final    NO GROWTH 3 DAYS Performed at [facility], [address], [place], [place] [address]    Report Status PENDING  Incomplete  Culture, blood (Routine X 2) w Reflex to ID Panel     Status: None (Preliminary result)   Collection Time: 06/04/18 11:17 PM  Result Value Ref Range Status   Specimen Description BLOOD LEFT ANTECUBITAL  Final   Special Requests   Final    BOTTLES DRAWN AEROBIC AND ANAEROBIC Blood Culture adequate volume   Culture   Final    NO GROWTH 3 DAYS Performed at [facility], [address] [place], [place] [address]    Report Status PENDING  Incomplete  MRSA PCR Screening     Status: None   Collection Time: 06/05/18  4:37 PM  Result Value Ref Range Status   MRSA by PCR NEGATIVE NEGATIVE Final    Comment:        The GeneXpert MRSA Assay (FDA approved for NASAL specimens only), is one component of a comprehensive MRSA colonization surveillance program. It is not intended to diagnose MRSA  infection nor to guide or monitor treatment for MRSA infections. Performed at [facility], [place] [address] [place], [place] [address]   Aerobic Culture (superficial specimen)     Status: None (Preliminary result)   Collection Time: 06/06/18  8:30 AM  Result Value Ref Range Status   Specimen Description WOUND LUMBAR  Final   Special Requests PATIENT ON FOLLOWING MEROPENEM  Final   Gram Stain   Final    RARE WBC PRESENT, PREDOMINANTLY MONONUCLEAR NO ORGANISMS SEEN Performed at [facility], [place] [address], [place], [place] [address]    Culture PENDING  Incomplete   Report Status PENDING  Incomplete    Radiology Reports Dg Chest 2 View  Result Date: 06/04/2018 CLINICAL DATA:  77 year old female with shortness of breath. EXAM: CHEST - 2 VIEW COMPARISON:  Chest radiograph dated 03/29/2017 FINDINGS: There is moderate cardiomegaly. Minimal vascular congestion. No edema. No focal consolidation, pleural effusion, or pneumothorax. No acute osseous pathology. IMPRESSION: Moderate cardiomegaly with minimal vascular congestion. No edema or focal consolidation. Electronically Signed   By: Anner Crete M.D.   On: 06/04/2018 04:01   Dg Lumbar Spine Complete  Result Date: 06/04/2018 CLINICAL DATA:  77 year old female with chronic lower back pain. EXAM: LUMBAR SPINE - COMPLETE 4+ VIEW COMPARISON:  Lumbar spine MRI dated 03/30/2018 FINDINGS: There is no acute fracture or subluxation of the lumbar spine. There is osteopenia with multilevel degenerative changes. L3-S1 disc spacers and posterior fixation rod and screws as well as L5-S1 anterior fusion noted. Overall these findings are similar to the radiograph of 03/09/2018. Apparent large pocket of air along the posterior lower lumbar spine at L4-S1 and along the fixation hardware likely represents superimposed bowel. Clinical correlation is recommended. There is atherosclerotic calcification of the aorta. IMPRESSION: 1. No acute fracture or subluxation of the lumbar spine. Stable postoperative changes. 2. A pocket of air over the  hardware, likely superimposed bowel. Electronically Signed   By: Anner Crete M.D.   On: 06/04/2018 04:09   Mr Lumbar Spine Wo Contrast  Result Date: 06/04/2018 CLINICAL DATA:  Fluid leaking from spinal wound incision, history of multiple lumbar spine surgeries. Status post lumbar spine wound debridement March 30, 2017. EXAM: MRI LUMBAR SPINE WITHOUT CONTRAST TECHNIQUE: Multiplanar, multisequence MR imaging of the lumbar spine was performed. No intravenous contrast was administered. COMPARISON:  Lumbar spine radiographs June 04, 2018 and MRI lumbar spine March 30, 2018 FINDINGS: Moderately motion degraded examination. SEGMENTATION: For the purposes of this report, the last well-formed intervertebral disc is reported as L5-S1. ALIGNMENT: Maintained lumbar lordosis. Minimal grade 1 L2-3 retrolisthesis. No spondylolysis. VERTEBRAE:Status post L3 through S1 PLIF, susceptibility artifact from hardware. Mild L1-2 disc height loss with mild disc desiccation. No suspicious or acute bone marrow signal the limited by hardware artifact and patient motion. CONUS MEDULLARIS AND CAUDA EQUINA: Conus medullaris terminates at L1-2 and demonstrates normal morphology and signal characteristics. Limited assessment of cauda equina due to hardware artifact common no nerve root clumping. PARASPINAL AND OTHER SOFT TISSUES: Multiple air-fluid levels within the paraspinal muscles and subcutaneous fat along surgical approach and within surgical bed. Postoperative paraspinal muscle denervation with superimposed RIGHT interstitial STIR signal. DISC LEVELS (limited assessment due to hardware artifact and motion): L1-2: Small broad-based disc bulge, mild facet arthropathy and ligamentum flavum redundancy. No canal stenosis. Mild bilateral neural foraminal narrowing. L2-3: Retrolisthesis. Moderate broad-based disc bulge. Moderate canal stenosis. Mild neural foraminal narrowing may be overestimated by hardware artifact. L3-4: PLIF,  posterior decompression. No canal stenosis or definite neural foraminal narrowing. L4-5: PLIF and posterior decompression. No canal stenosis. Moderate possible LEFT neural foraminal narrowing, potentially overestimated by hardware artifact. L5-S1: PLIF and posterior decompression. No canal stenosis. Small suspected central disc extrusion, stable from prior examination. No definite neural foraminal narrowing. IMPRESSION: 1. Air-fluid levels within paraspinal muscles and subcutaneous fat along surgical approach and within surgical bed, query recent intervention, alternatively this could reflect infection. Additional paraspinal muscle denervation versus myositis. 2. Status post L3 through S1 PLIF and posterior decompression. Minimal grade 1 L2-3 retrolisthesis. 3. Moderate canal stenosis L2-3. Multilevel suspected neural foraminal narrowing, potentially moderate on the LEFT at L4-5. 4. These results will be called to the ordering clinician or representative by the Radiologist Assistant, and communication documented in the PACS or zVision Dashboard. Electronically Signed   By: Elon Alas M.D.   On: 06/04/2018 23:14   US Renal  Result Date: 06/04/2018 CLINICAL DATA:  Acute renal failure EXAM: RENAL / URINARY TRACT ULTRASOUND COMPLETE COMPARISON:  CT 03/29/2017 FINDINGS: Right Kidney: Renal measurements: 9.6 x 5.7 x 5.2 cm = volume: 142 mL. Diffusely increased echotexture. No mass or hydronephrosis. Left Kidney: Renal measurements: 9.1 x 4.4 x 3.4 cm = volume: 71 mL. Diffusely increased echotexture. No mass or hydronephrosis. Bladder: Not visualized, decompressed with Foley catheter in place. IMPRESSION: Atrophic, echogenic kidneys compatible with chronic medical renal disease. No hydronephrosis. Electronically Signed   By: Rolm Baptise M.D.   On: 06/04/2018 09:48   Dg Chest Port 1 View  Result Date: 06/07/2018 CLINICAL DATA:  Shortness of breath. EXAM: PORTABLE CHEST 1 VIEW COMPARISON:  06/05/2018,  07/06/2016 and chest CT 03/29/2017 FINDINGS: Lungs are adequately inflated without focal airspace consolidation or effusion. Minimal prominence of the perihilar markings. Pleural based mass over the lateral right lower thorax which may be slightly larger, although previous CT suggests this represents a lipoma. Moderate stable cardiomegaly. Remainder of the exam is unchanged. IMPRESSION: Moderate stable cardiomegaly with suggestion mild vascular congestion. Pleural base mass over the lateral right lower thorax which may be slightly larger, although shown to represent a lipoma by previous CT. Electronically Signed   By: Marin Olp M.D.   On: 06/07/2018 07:23   Dg Chest Port 1 View  Result Date: 06/05/2018 CLINICAL DATA:  SOB. Best obtainable image EXAM: PORTABLE CHEST 1 VIEW COMPARISON:  Radiograph 06/04/2018 FINDINGS: Stable enlarged cardiac silhouette. Central venous pulmonary congestion. No effusion, infiltrate pneumothorax. IMPRESSION: Cardiomegaly and central venous congestion.  No interval change. Electronically Signed   By: Suzy Bouchard M.D.   On: 06/05/2018 14:54    Time Spent in minutes  30   Lala Lund M.D on 06/07/2018 at 10:37 AM  To page go to [email or web address] - password [facility]

## 2018-06-07 NOTE — Evaluation (Signed)
Physical Therapy Evaluation Patient Details Name: Taylor Tyler MRN: [id number] DOB: Nov 04, 1940 Today's Date: 06/07/2018   History of Present Illness  77 y.o. AA female with H/O Morbid Obesity, CKD 3, DM2, HTN, anemia of chronic disease, fatty liver, hypothyroidism, status post L spine surgery along with L5-S1 fusion done in July 2018 at [facility] by Dr. Cyndy Freeze. Presented to [facility] regional hospital a few days ago was diagnosed with postop site infection and abscess with pus drainage, poorly controlled blood pressure, she was admitted to the hospital seen by ID, her case was discussed by neurosurgery at [facility] and then she was transferred here for further care. On 11/3 pt underwent lumbar wound exploration, debridement and closure of open wound.  Clinical Impression  Pt with denies L LE pain and only a little soreness at surgical site. Pt functioning at supervision level and anticipate patient to progress quickly. Pt with good home set up and has people visiting her daily. Pt tolerated amb >180' with RW. Pt also has adaptive equip from previous back surgery to assist with ADLs safely without bending over. Acute PT to cont to follow.    Follow Up Recommendations Home health PT;Supervision - Intermittent    Equipment Recommendations  None recommended by PT(shower seat)    Recommendations for Other Services       Precautions / Restrictions Precautions Precautions: None Precaution Comments: followed back precautions for pain management but not necessary Restrictions Weight Bearing Restrictions: No      Mobility  Bed Mobility Overal bed mobility: Needs Assistance Bed Mobility: Rolling;Sidelying to Sit Rolling: Supervision Sidelying to sit: Min guard       General bed mobility comments: used bed rail, increased time, min guard for trunk elevation  Transfers Overall transfer level: Needs assistance Equipment used: Rolling walker (2 wheeled) Transfers: Sit  to/from Stand Sit to Stand: Min guard         General transfer comment: verbal cues to push up from the bed  Ambulation/Gait Ambulation/Gait assistance: Min guard Gait Distance (Feet): 180 Feet Assistive device: Rolling walker (2 wheeled) Gait Pattern/deviations: Step-through pattern;Decreased stride length Gait velocity: slow Gait velocity interpretation: <1.31 ft/sec, indicative of household ambulator General Gait Details: no episodes of LOB, good walker management, no physical assist needed  Stairs            Wheelchair Mobility    Modified Rankin (Stroke Patients Only)       Balance Overall balance assessment: Mild deficits observed, not formally tested                                           Pertinent Vitals/Pain Pain Assessment: 0-10 Pain Score: 3  Pain Location: surgical site Pain Descriptors / Indicators: Sore Pain Intervention(s): Monitored during session    Home Living Family/patient expects to be discharged to:: Private residence Living Arrangements: Alone Available Help at Discharge: Family;Available PRN/intermittently Type of Home: House Home Access: Stairs to enter Entrance Stairs-Rails: None Entrance Stairs-Number of Steps: 1 Home Layout: One level Home Equipment: Walker - 2 wheels;Cane - single point      Prior Function Level of Independence: Independent         Comments: pt was indep until 2 weeks ago when she started using a cane due to L LE pain     Hand Dominance   Dominant Hand: Right    Extremity/Trunk  Assessment   Upper Extremity Assessment Upper Extremity Assessment: Overall WFL for tasks assessed    Lower Extremity Assessment Lower Extremity Assessment: Overall WFL for tasks assessed    Cervical / Trunk Assessment Cervical / Trunk Assessment: Other exceptions Cervical / Trunk Exceptions: recent back surgery  Communication   Communication: No difficulties  Cognition Arousal/Alertness:  Awake/alert Behavior During Therapy: WFL for tasks assessed/performed Overall Cognitive Status: Within Functional Limits for tasks assessed                                        General Comments General comments (skin integrity, edema, etc.): discussed modifications with adaptive equip for ADls    Exercises     Assessment/Plan    PT Assessment Patient needs continued PT services  PT Problem List Decreased strength;Decreased range of motion;Decreased activity tolerance;Decreased balance;Decreased mobility;Decreased coordination;Decreased knowledge of use of DME;Decreased knowledge of precautions       PT Treatment Interventions DME instruction;Gait training;Stair training;Functional mobility training;Therapeutic activities;Therapeutic exercise;Balance training    PT Goals (Current goals can be found in the Care Plan section)  Acute Rehab PT Goals Patient Stated Goal: home PT Goal Formulation: With patient Time For Goal Achievement: 06/21/18 Potential to Achieve Goals: Good    Frequency Min 5X/week   Barriers to discharge Decreased caregiver support family stops in frequently t/o day    Co-evaluation               AM-PAC PT "6 Clicks" Daily Activity  Outcome Measure Difficulty turning over in bed (including adjusting bedclothes, sheets and blankets)?: Unable Difficulty moving from lying on back to sitting on the side of the bed? : Unable Difficulty sitting down on and standing up from a chair with arms (e.g., wheelchair, bedside commode, etc,.)?: A Little Help needed moving to and from a bed to chair (including a wheelchair)?: A Little Help needed walking in hospital room?: A Little Help needed climbing 3-5 steps with a railing? : A Little 6 Click Score: 14    End of Session   Activity Tolerance: Patient tolerated treatment well Patient left: in chair;with call bell/phone within reach;with family/visitor present Nurse Communication: Mobility  status PT Visit Diagnosis: Other abnormalities of gait and mobility (R26.89)    Time: 5176-1607 PT Time Calculation (min) (ACUTE ONLY): 23 min   Charges:   PT Evaluation $PT Eval Moderate Complexity: 1 Mod PT Treatments $Gait Training: 8-22 mins        Kittie Plater, PT, DPT Acute Rehabilitation Services Pager #: [phone] Office #: [phone]   Berline Lopes 06/07/2018, 9:22 AM

## 2018-06-07 NOTE — Progress Notes (Signed)
[facility] Infusion Coordinator will follow pt with ID team to support Home Infusion Pharmacy services if needed.  If patient discharges after hours, please call [phone].   Taylor Tyler 06/07/2018, 7:08 AM

## 2018-06-07 NOTE — Progress Notes (Signed)
Please see attending note of same day for complete details.     [facility] for Infectious Disease  Date of Admission:  06/05/2018   Total days of antibiotics 2         ASSESSMENT: [name] a77 y.o.AAfemalewith H/O Morbid Obesity, CKD 3, DM2, HTN,anemia of chronic disease, fatty liver, hypothyroidism, status post L spine surgery along with L5-S1 fusion done in July 2018 with worsening back pain found to have persistent drainage from the back site now s/p I&D on 11/3 with neurosurgery. Pt with previous postop-infection found to be growing proteus. Wound cultures pending, no organisms seen, rare WBCs. Op report w evidence of superficial infx. Pt remains afebrile but with worsening leukocytosis (21.6 today increased from 15 on 11/3). Pt remains on vanc and meropenem.  PLAN: 1. Continue vanc and meropenem 2. Await cultures 3. Continue with PT/OT 4. Tight control of blood sugars to facilitate proper wound healing.   Principal Problem:   Postoperative wound infection Active Problems:   HLD (hyperlipidemia)   Status post lumbar surgery   Obesity, Class III, BMI 40-49.9 (morbid obesity) (HCC)   DM (diabetes mellitus), type 2 with renal complications (HCC)   Anemia   Acute renal failure superimposed on stage 3 chronic kidney disease (HCC)   Essential hypertension   Infection and inflammatory reaction due to internal fixation device of spine, initial encounter ([place])   Scheduled Meds: . sodium chloride   Intravenous Once  . amLODipine  10 mg Oral Daily  . carvedilol  25 mg Oral BID WC  . cloNIDine  0.1 mg Oral TID  . heparin  5,000 Units Subcutaneous Q8H  . isosorbide mononitrate  60 mg Oral Daily  . levothyroxine  112 mcg Oral Q0600  . sodium chloride flush  3 mL Intravenous Q12H   Continuous Infusions: . meropenem (MERREM) IV 500 mg (06/06/18 2144)  . vancomycin 1,000 mg (06/06/18 1819)   PRN Meds:.diphenhydrAMINE, HYDROcodone-acetaminophen, labetalol, morphine  injection   SUBJECTIVE: Reports she feels better overall, back pain is stable not increased. She worked with PT this am and felt like she did well. Tolerating PO solids and liquids, denies fevers/chills. No n/v/d. Voiding appropriately.  Review of Systems: Review of Systems  Constitutional: Negative for chills and fever.  Cardiovascular: Negative for leg swelling.  Musculoskeletal: Positive for back pain.  Skin: Negative for rash.  All other systems reviewed and are negative.   Allergies  Allergen Reactions  . Clams [Shellfish Allergy] Swelling and Other (See Comments)    THROAT SWELLS NECK TURNS RED  . Hydralazine Other (See Comments)    CHEST TIGHTNESS  . Norvasc [Amlodipine Besylate] Swelling    Leg edema   . Milk-Related Compounds Diarrhea    OBJECTIVE: Vitals:   06/06/18 2300 06/07/18 0010 06/07/18 0105 06/07/18 0300  BP: (!) 183/83  (!) 179/83 (!) 193/85  Pulse: (!) 54 64 (!) 47 70  Resp:      Temp: 97.7 F (36.5 C)   97.6 F (36.4 C)  TempSrc: Oral   Oral  SpO2: 100% 100% 100% 100%  Weight:       Body mass index is 42.74 kg/m.  Physical Exam  Constitutional: She is oriented to person, place, and time. She appears well-developed and well-nourished.  HENT:  Head: Normocephalic and atraumatic.  Eyes: EOM are normal.  Neck: Normal range of motion.  Pulmonary/Chest: Effort normal. No respiratory distress.  Musculoskeletal:  Lumbar back dressing in place. C/d/i without erythema  Neurological: She  is alert and oriented to person, place, and time.  Skin: Skin is warm and dry.    Lab Results Lab Results  Component Value Date   WBC 21.6 (H) 06/07/2018   HGB 7.9 (L) 06/07/2018   HCT 25.7 (L) 06/07/2018   MCV 82.9 06/07/2018   PLT 386 06/07/2018    Lab Results  Component Value Date   CREATININE 1.94 (H) 06/07/2018   BUN 47 (H) 06/07/2018   NA 135 06/07/2018   K 4.4 06/07/2018   CL 105 06/07/2018   CO2 25 06/07/2018    Lab Results  Component Value  Date   ALT 11 06/07/2018   AST 13 (L) 06/07/2018   ALKPHOS 58 06/07/2018   BILITOT 0.7 06/07/2018     Microbiology: Recent Results (from the past 240 hour(s))  Aerobic/Anaerobic Culture (surgical/deep wound)     Status: None (Preliminary result)   Collection Time: 06/04/18  6:11 AM  Result Value Ref Range Status   Specimen Description   Final    BACK Performed at [facility], [address], [place], [place] [address]    Special Requests   Final    Normal Performed at [facility], [address] [place], [place] [address]    Gram Stain   Final    RARE WBC PRESENT, PREDOMINANTLY PMN RARE GRAM POSITIVE RODS RARE GRAM POSITIVE COCCI Performed at [facility], [place] [address], [place], [place] [address]    Culture   Final    CULTURE REINCUBATED FOR BETTER GROWTH NO ANAEROBES ISOLATED; CULTURE IN PROGRESS FOR 5 DAYS    Report Status PENDING  Incomplete  MRSA PCR Screening     Status: None   Collection Time: 06/04/18  6:13 AM  Result Value Ref Range Status   MRSA by PCR NEGATIVE NEGATIVE Final    Comment:        The GeneXpert MRSA Assay (FDA approved for NASAL specimens only), is one component of a comprehensive MRSA colonization surveillance program. It is not intended to diagnose MRSA infection nor to guide or monitor treatment for MRSA infections. Performed at [facility], [address] [place], [place] [address]   Culture, blood (Routine X 2) w Reflex to ID Panel     Status: None (Preliminary result)   Collection Time: 06/04/18 11:15 PM  Result Value Ref Range Status   Specimen Description BLOOD RIGHT HAND  Final   Special Requests   Final    BOTTLES DRAWN AEROBIC AND ANAEROBIC Blood Culture adequate volume   Culture   Final    NO GROWTH 3 DAYS Performed at [facility], [address] [place], [place] [address]    Report Status PENDING  Incomplete  Culture, blood (Routine X 2) w Reflex to ID Panel     Status:  None (Preliminary result)   Collection Time: 06/04/18 11:17 PM  Result Value Ref Range Status   Specimen Description BLOOD LEFT ANTECUBITAL  Final   Special Requests   Final    BOTTLES DRAWN AEROBIC AND ANAEROBIC Blood Culture adequate volume   Culture   Final    NO GROWTH 3 DAYS Performed at [facility], [address] [place],  [address]    Report Status PENDING  Incomplete  MRSA PCR Screening     Status: None   Collection Time: 06/05/18  4:37 PM  Result Value Ref Range Status   MRSA by PCR NEGATIVE NEGATIVE Final    Comment:  The GeneXpert MRSA Assay (FDA approved for NASAL specimens only), is one component of a comprehensive MRSA colonization surveillance program. It is not intended to diagnose MRSA infection nor to guide or monitor treatment for MRSA infections. Performed at [facility], [place] [address] [place], [place] [address]   Aerobic Culture (superficial specimen)     Status: None (Preliminary result)   Collection Time: 06/06/18  8:30 AM  Result Value Ref Range Status   Specimen Description WOUND LUMBAR  Final   Special Requests PATIENT ON FOLLOWING MEROPENEM  Final   Gram Stain   Final    RARE WBC PRESENT, PREDOMINANTLY MONONUCLEAR NO ORGANISMS SEEN Performed at [facility], [place] [address], [place], [place] [address]    Culture PENDING  Incomplete   Report Status PENDING  Incomplete    April  A Peterson, Candelaria Arenas for [facility] [phone] pager   [phone] cell 06/07/2018, 8:47 AM

## 2018-06-07 NOTE — Progress Notes (Signed)
[facility] for Infectious Disease   Reason for visit: Follow up on wound infection  Interval History: some growth of Proteus on initial swab from 11/1, OR cultures pending; afebrile, WBC 21.6.  No associated n/v, no rash.    Physical Exam: Constitutional:  Vitals:   06/07/18 1138 06/07/18 1417  BP: (!) 221/82 (!) 159/61  Pulse: 64 (!) 59  Resp:    Temp:    SpO2: 99% 100%   patient appears in NAD, up in chair Eyes: anicteric Respiratory: Normal respiratory effort; CTA B Cardiovascular: RRR GI: soft, nt, nd  Review of Systems: Constitutional: negative for fevers and chills Gastrointestinal: negative for nausea and diarrhea Integument/breast: negative for rash  Lab Results  Component Value Date   WBC 21.6 (H) 06/07/2018   HGB 7.9 (L) 06/07/2018   HCT 25.7 (L) 06/07/2018   MCV 82.9 06/07/2018   PLT 386 06/07/2018    Lab Results  Component Value Date   CREATININE 1.94 (H) 06/07/2018   BUN 47 (H) 06/07/2018   NA 135 06/07/2018   K 4.4 06/07/2018   CL 105 06/07/2018   CO2 25 06/07/2018    Lab Results  Component Value Date   ALT 11 06/07/2018   AST 13 (L) 06/07/2018   ALKPHOS 58 06/07/2018     Microbiology: Recent Results (from the past 240 hour(s))  Aerobic/Anaerobic Culture (surgical/deep wound)     Status: None (Preliminary result)   Collection Time: 06/04/18  6:11 AM  Result Value Ref Range Status   Specimen Description   Final    BACK Performed at [facility], [address] [place], [place] [address]    Special Requests   Final    Normal Performed at [facility], [address] [place], [place] [address]    Gram Stain   Final    RARE WBC PRESENT, PREDOMINANTLY PMN RARE GRAM POSITIVE RODS RARE GRAM POSITIVE COCCI Performed at [facility], [place] [address], [place], [place] [address]    Culture   Final    FEW PROTEUS MIRABILIS NO ANAEROBES ISOLATED; CULTURE IN PROGRESS FOR 5 DAYS    Report Status PENDING   Incomplete  MRSA PCR Screening     Status: None   Collection Time: 06/04/18  6:13 AM  Result Value Ref Range Status   MRSA by PCR NEGATIVE NEGATIVE Final    Comment:        The GeneXpert MRSA Assay (FDA approved for NASAL specimens only), is one component of a comprehensive MRSA colonization surveillance program. It is not intended to diagnose MRSA infection nor to guide or monitor treatment for MRSA infections. Performed at [facility], [place] [place],  [address]   Culture, blood (Routine X 2) w Reflex to ID Panel     Status: None (Preliminary result)   Collection Time: 06/04/18 11:15 PM  Result Value Ref Range Status   Specimen Description BLOOD RIGHT HAND  Final   Special Requests   Final    BOTTLES DRAWN AEROBIC AND ANAEROBIC Blood Culture adequate volume   Culture   Final    NO GROWTH 3 DAYS Performed at [facility], [address], [place],  [address]    Report Status PENDING  Incomplete  Culture, blood (Routine X 2) w Reflex to ID Panel     Status: None (Preliminary result)   Collection Time: 06/04/18 11:17 PM  Result Value Ref Range Status   Specimen Description BLOOD LEFT  ANTECUBITAL  Final   Special Requests   Final    BOTTLES DRAWN AEROBIC AND ANAEROBIC Blood Culture adequate volume   Culture   Final    NO GROWTH 3 DAYS Performed at [facility], [address] [place], [place] [address]    Report Status PENDING  Incomplete  MRSA PCR Screening     Status: None   Collection Time: 06/05/18  4:37 PM  Result Value Ref Range Status   MRSA by PCR NEGATIVE NEGATIVE Final    Comment:        The GeneXpert MRSA Assay (FDA approved for NASAL specimens only), is one component of a comprehensive MRSA colonization surveillance program. It is not intended to diagnose MRSA infection nor to guide or monitor treatment for MRSA infections. Performed at [facility], [place] [address], [place], [place] [address]     Aerobic Culture (superficial specimen)     Status: None (Preliminary result)   Collection Time: 06/06/18  8:30 AM  Result Value Ref Range Status   Specimen Description WOUND LUMBAR  Final   Special Requests PATIENT ON FOLLOWING MEROPENEM  Final   Gram Stain   Final    RARE WBC PRESENT, PREDOMINANTLY MONONUCLEAR NO ORGANISMS SEEN Performed at [facility], [place] [address], [place], [place] [address]    Culture PENDING  Incomplete   Report Status PENDING  Incomplete    Impression/Plan:  1. Wound infection - s/p debridement and appears superficial.  No subfacial purulence.  Growth of Proteus again from 11/1.  Will continue with vancomycin and meropenem for now and monitor OR cultures for anything different and wait for sensitivities of Proteus.    2.  Medication monitoring - creat improved, closer to her baseline.   3.  Leukocytosis - likely reactive with recent surgery.  Will continue to monitor.

## 2018-06-08 DIAGNOSIS — T8149XA Infection following a procedure, other surgical site, initial encounter: Secondary | ICD-10-CM

## 2018-06-08 LAB — CBC
HEMATOCRIT: 27.6 % — AB (ref 36.0–46.0)
Hemoglobin: 8.4 g/dL — ABNORMAL LOW (ref 12.0–15.0)
MCH: 25.5 pg — AB (ref 26.0–34.0)
MCHC: 30.4 g/dL (ref 30.0–36.0)
MCV: 83.9 fL (ref 80.0–100.0)
Platelets: 362 10*3/uL (ref 150–400)
RBC: 3.29 MIL/uL — ABNORMAL LOW (ref 3.87–5.11)
RDW: 15.9 % — AB (ref 11.5–15.5)
WBC: 18.4 10*3/uL — AB (ref 4.0–10.5)
nRBC: 0 % (ref 0.0–0.2)

## 2018-06-08 LAB — COMPREHENSIVE METABOLIC PANEL
ALT: 10 U/L (ref 0–44)
AST: 16 U/L (ref 15–41)
Albumin: 1.8 g/dL — ABNORMAL LOW (ref 3.5–5.0)
Alkaline Phosphatase: 63 U/L (ref 38–126)
Anion gap: 7 (ref 5–15)
BILIRUBIN TOTAL: 0.6 mg/dL (ref 0.3–1.2)
BUN: 58 mg/dL — ABNORMAL HIGH (ref 8–23)
CALCIUM: 8.3 mg/dL — AB (ref 8.9–10.3)
CO2: 22 mmol/L (ref 22–32)
Chloride: 105 mmol/L (ref 98–111)
Creatinine, Ser: 2.21 mg/dL — ABNORMAL HIGH (ref 0.44–1.00)
GFR, EST AFRICAN AMERICAN: 24 mL/min — AB (ref >60)
GFR, EST NON AFRICAN AMERICAN: 21 mL/min — AB (ref >60)
Glucose, Bld: 122 mg/dL — ABNORMAL HIGH (ref 70–99)
POTASSIUM: 4.3 mmol/L (ref 3.5–5.1)
Sodium: 134 mmol/L — ABNORMAL LOW (ref 135–145)
TOTAL PROTEIN: 5.8 g/dL — AB (ref 6.5–8.1)

## 2018-06-08 LAB — GLUCOSE, CAPILLARY: GLUCOSE-CAPILLARY: 136 mg/dL — AB (ref 70–99)

## 2018-06-08 MED ORDER — VITAMIN D 1000 UNITS PO TABS
1000.0000 [IU] | ORAL_TABLET | Freq: Every day | ORAL | Status: DC
  Administered 2018-06-08 – 2018-06-11 (×4): 1000 [IU] via ORAL
  Filled 2018-06-08 (×2): qty 1

## 2018-06-08 MED ORDER — SODIUM CHLORIDE 0.9 % IV SOLN: 1.0000 g | INTRAVENOUS | Status: DC

## 2018-06-08 MED ORDER — SODIUM CHLORIDE 0.9 % IV SOLN
2.0000 g | INTRAVENOUS | Status: DC
  Administered 2018-06-09 – 2018-06-12 (×4): 2 g via INTRAVENOUS
  Filled 2018-06-08 (×4): qty 20

## 2018-06-08 MED ORDER — SODIUM CHLORIDE 0.9 % IV SOLN
1.0000 g | Freq: Once | INTRAVENOUS | Status: DC
  Filled 2018-06-08: qty 10

## 2018-06-08 MED ORDER — SODIUM CHLORIDE 0.9 % IV SOLN
2.0000 g | Freq: Once | INTRAVENOUS | Status: AC
  Administered 2018-06-08: 2 g via INTRAVENOUS
  Filled 2018-06-08: qty 20

## 2018-06-08 MED ORDER — VITAMIN C 500 MG PO TABS
500.0000 mg | ORAL_TABLET | Freq: Every day | ORAL | Status: DC
  Administered 2018-06-08 – 2018-06-11 (×4): 500 mg via ORAL
  Filled 2018-06-08 (×4): qty 1

## 2018-06-08 MED ORDER — FUROSEMIDE 40 MG PO TABS
40.0000 mg | ORAL_TABLET | Freq: Two times a day (BID) | ORAL | Status: DC
  Administered 2018-06-08 – 2018-06-12 (×8): 40 mg via ORAL
  Filled 2018-06-08 (×8): qty 1

## 2018-06-08 NOTE — Progress Notes (Signed)
Physical Therapy Treatment Patient Details Name: Taylor Tyler MRN: [id number] DOB: 1941-01-07 Today's Date: 06/08/2018    History of Present Illness 77 y.o. AA female with H/O Morbid Obesity, CKD 3, DM2, HTN, anemia of chronic disease, fatty liver, hypothyroidism, status post L spine surgery along with L5-S1 fusion done in July 2018 at [facility] by Dr. Cyndy Freeze. Presented to [facility] hospital a few days ago was diagnosed with postop site infection and abscess with pus drainage, poorly controlled blood pressure, she was admitted to the hospital seen by ID, her case was discussed by neurosurgery at [facility] and then she was transferred here for further care. On 11/3 pt underwent lumbar wound exploration, debridement and closure of open wound.    PT Comments    Pt functioning at supervision level. Reports she will use her adaptive equipment for lower body dressing, ie. Reacher and sock aide. Pt with increased ambulation tolerance this date as well. Cont to recommend HHPT. Acute PT to cont to follow.    Follow Up Recommendations  Home health PT;Supervision - Intermittent     Equipment Recommendations  None recommended by PT    Recommendations for Other Services       Precautions / Restrictions Precautions Precautions: None Precaution Comments: followed back precautions for pain management but not necessary Restrictions Weight Bearing Restrictions: No    Mobility  Bed Mobility               General bed mobility comments: pt up in chair upon PT arrival  Transfers Overall transfer level: Needs assistance Equipment used: Rolling walker (2 wheeled) Transfers: Sit to/from Stand Sit to Stand: Supervision         General transfer comment: increased time but pushed up from arm rests and was able to achieve full trunk extension prior to transitioning hands to RW  Ambulation/Gait Ambulation/Gait assistance: Supervision Gait Distance (Feet): 225  Feet Assistive device: Rolling walker (2 wheeled) Gait Pattern/deviations: Step-through pattern;Decreased stride length Gait velocity: slow Gait velocity interpretation: 1.31 - 2.62 ft/sec, indicative of limited community ambulator General Gait Details: pt able to amb short distance without AD but more steady and experiences decreased pain with RW. Pt reports "I'll use mine at home until i feel comfortable" no epsiodes of LOB or standing rest breaks   Stairs             Wheelchair Mobility    Modified Rankin (Stroke Patients Only)       Balance Overall balance assessment: Mild deficits observed, not formally tested                                          Cognition Arousal/Alertness: Awake/alert Behavior During Therapy: WFL for tasks assessed/performed Overall Cognitive Status: Within Functional Limits for tasks assessed                                        Exercises      General Comments General comments (skin integrity, edema, etc.): pt reports "I dont want to go to a nursing home but they say I have to" Paged Dr. Candiss Norse, spoked about d/c recs and plans of HHPT, he agreed.      Pertinent Vitals/Pain Pain Assessment: 0-10 Pain Score: 5  Pain Location: surgical site Pain Descriptors /  Indicators: Sore Pain Intervention(s): Monitored during session    Home Living                      Prior Function            PT Goals (current goals can now be found in the care plan section) Acute Rehab PT Goals Patient Stated Goal: home Progress towards PT goals: Progressing toward goals    Frequency    Min 3X/week      PT Plan Current plan remains appropriate;Frequency needs to be updated    Co-evaluation              AM-PAC PT "6 Clicks" Daily Activity  Outcome Measure  Difficulty turning over in bed (including adjusting bedclothes, sheets and blankets)?: A Little Difficulty moving from lying on back to  sitting on the side of the bed? : A Little Difficulty sitting down on and standing up from a chair with arms (e.g., wheelchair, bedside commode, etc,.)?: A Little Help needed moving to and from a bed to chair (including a wheelchair)?: A Little Help needed walking in hospital room?: A Little Help needed climbing 3-5 steps with a railing? : A Little 6 Click Score: 18    End of Session   Activity Tolerance: Patient tolerated treatment well Patient left: in chair;with call bell/phone within reach;with family/visitor present Nurse Communication: Mobility status PT Visit Diagnosis: Other abnormalities of gait and mobility (R26.89)     Time: 4037-5436 PT Time Calculation (min) (ACUTE ONLY): 22 min  Charges:  $Gait Training: 8-22 mins                     Kittie Plater, PT, DPT Acute Rehabilitation Services Pager #: [phone] Office #: [phone]    Berline Lopes 06/08/2018, 9:24 AM

## 2018-06-08 NOTE — Progress Notes (Signed)
  NEUROSURGERY PROGRESS NOTE   No issues overnight.  Complains of back soreness Pain well controlled Voiding normal  EXAM:  BP (!) 179/78 (BP Location: Right Arm)   Pulse (!) 59   Temp (!) 97.5 F (36.4 C) (Axillary)   Resp 18   Wt 106 kg   SpO2 100%   BMI 42.74 kg/m   Awake, alert, oriented  Speech fluent, appropriate  CN grossly intact  MAEW, RLE>LLE Incision: c/d/i, no active drainage from surgical site Drain in place with purulent bloody discharge. 210cc drained ovr the past 24 hours.  IMPRESSION/PLAN 77 y.o. female pod #2 s/p lumbar wound exploration, debridement and closure of open wound. Neurologically stable. No drainage from wound. - Continue hemovac - No new NS recs

## 2018-06-08 NOTE — Care Management Important Message (Signed)
Important Message  Patient Details  Name: Taylor Tyler MRN: [id number] Date of Birth: 17-Feb-1941   Medicare Important Message Given:  Yes    Orbie Pyo 06/08/2018, 3:03 PM

## 2018-06-08 NOTE — Progress Notes (Signed)
Please see attending note of same day.    [facility]  Date of Admission:  06/05/2018   Total days of antibiotics 3          ASSESSMENT: 77 yo pt withhx of lumbar fusion in July 2018 with persistent drainage now s/p I&D on 11/3 with neurosurgery. Some growth of Proteus on initial swab from 11/1, OR cultures pending; afebrile, WBC 18.4 improved from 21.6.  No associated n/v, no rash. Previous proteus resistant to imipenem. Pt switched to ceftriaxone on 11/4.   PLAN: 1. Continue ceftriaxone 2. Monitor cultures  Principal Problem:   Postoperative wound infection Active Problems:   HLD (hyperlipidemia)   Status post lumbar surgery   Obesity, Class III, BMI 40-49.9 (morbid obesity) (HCC)   DM (diabetes mellitus), type 2 with renal complications (HCC)   Anemia   Acute renal failure superimposed on stage 3 chronic kidney disease (HCC)   Essential hypertension   Infection and inflammatory reaction due to internal fixation device of spine, initial encounter ([place])   Scheduled Meds: . sodium chloride   Intravenous Once  . amLODipine  10 mg Oral Daily  . carvedilol  25 mg Oral BID WC  . cloNIDine  0.1 mg Oral TID  . heparin  5,000 Units Subcutaneous Q8H  . hydrALAZINE  50 mg Oral Q8H  . isosorbide mononitrate  60 mg Oral Daily  . levothyroxine  112 mcg Oral Q0600  . sodium chloride flush  3 mL Intravenous Q12H   Continuous Infusions: . [START ON 06/09/2018] cefTRIAXone (ROCEPHIN)  IV    . cefTRIAXone (ROCEPHIN)  IV     PRN Meds:.diphenhydrAMINE, HYDROcodone-acetaminophen, labetalol, morphine injection   SUBJECTIVE: Reports she is doing well, no fevers chills. No n/v/d. Eating well.   Review of Systems: Review of Systems  Constitutional: Negative for chills, fever and malaise/fatigue.  HENT: Ear pain:                                                                                                                                                                                                                                                                       Allergies  Allergen Reactions  . Clams [Shellfish Allergy] Swelling and  Other (See Comments)    THROAT SWELLS NECK TURNS RED  . Hydralazine Other (See Comments)    CHEST TIGHTNESS Patient has tolerated multiple doses of hydralazine since allergy was listed   . Norvasc [Amlodipine Besylate] Swelling    Leg edema   . Milk-Related Compounds Diarrhea    OBJECTIVE: Vitals:   06/07/18 2331 06/08/18 0543 06/08/18 0745 06/08/18 0840  BP: (!) 153/63 (!) 179/78 (!) 165/73   Pulse:    60  Resp: 18     Temp: (!) 97.5 F (36.4 C) (!) 97.5 F (36.4 C) 98 F (36.7 C) 98 F (36.7 C)  TempSrc: Oral Axillary Oral   SpO2:  100%  100%  Weight:       Body mass index is 42.74 kg/m.  Physical Exam  Lab Results Lab Results  Component Value Date   WBC 18.4 (H) 06/08/2018   HGB 8.4 (L) 06/08/2018   HCT 27.6 (L) 06/08/2018   MCV 83.9 06/08/2018   PLT 362 06/08/2018    Lab Results  Component Value Date   CREATININE 2.21 (H) 06/08/2018   BUN 58 (H) 06/08/2018   NA 134 (L) 06/08/2018   K 4.3 06/08/2018   CL 105 06/08/2018   CO2 22 06/08/2018    Lab Results  Component Value Date   ALT 10 06/08/2018   AST 16 06/08/2018   ALKPHOS 63 06/08/2018   BILITOT 0.6 06/08/2018     Microbiology: Recent Results (from the past 240 hour(s))  Aerobic/Anaerobic Culture (surgical/deep wound)     Status: None (Preliminary result)   Collection Time: 06/04/18  6:11 AM  Result Value Ref Range Status   Specimen Description   Final    BACK Performed at [facility], [address], [place], [place] [address]    Special Requests   Final    Normal Performed at [facility], [address] [place], [place] [address]    Gram Stain   Final    RARE WBC PRESENT, PREDOMINANTLY PMN RARE GRAM POSITIVE RODS RARE GRAM POSITIVE COCCI Performed at [facility],  [place] [address], [place], [place] [address]    Culture   Final    FEW PROTEUS MIRABILIS NO ANAEROBES ISOLATED; CULTURE IN PROGRESS FOR 5 DAYS    Report Status PENDING  Incomplete  MRSA PCR Screening     Status: None   Collection Time: 06/04/18  6:13 AM  Result Value Ref Range Status   MRSA by PCR NEGATIVE NEGATIVE Final    Comment:        The GeneXpert MRSA Assay (FDA approved for NASAL specimens only), is one component of a comprehensive MRSA colonization surveillance program. It is not intended to diagnose MRSA infection nor to guide or monitor treatment for MRSA infections. Performed at [facility], [address] [place], [place] [address]   Culture, blood (Routine X 2) w Reflex to ID Panel     Status: None (Preliminary result)   Collection Time: 06/04/18 11:15 PM  Result Value Ref Range Status   Specimen Description BLOOD RIGHT HAND  Final   Special Requests   Final    BOTTLES DRAWN AEROBIC AND ANAEROBIC Blood Culture adequate volume   Culture   Final    NO GROWTH 4 DAYS Performed at [facility], [address] [place], [place] [address]    Report Status PENDING  Incomplete  Culture, blood (Routine X 2) w Reflex to ID Panel     Status:  None (Preliminary result)   Collection Time: 06/04/18 11:17 PM  Result Value Ref Range Status   Specimen Description BLOOD LEFT ANTECUBITAL  Final   Special Requests   Final    BOTTLES DRAWN AEROBIC AND ANAEROBIC Blood Culture adequate volume   Culture   Final    NO GROWTH 4 DAYS Performed at [facility], [address], [place], [place] [address]    Report Status PENDING  Incomplete  MRSA PCR Screening     Status: None   Collection Time: 06/05/18  4:37 PM  Result Value Ref Range Status   MRSA by PCR NEGATIVE NEGATIVE Final    Comment:        The GeneXpert MRSA Assay (FDA approved for NASAL specimens only), is one component of a comprehensive MRSA colonization surveillance program. It is  not intended to diagnose MRSA infection nor to guide or monitor treatment for MRSA infections. Performed at [facility],  [address], [place], [place] [address]   Aerobic Culture (superficial specimen)     Status: None (Preliminary result)   Collection Time: 06/06/18  8:30 AM  Result Value Ref Range Status   Specimen Description WOUND LUMBAR  Final   Special Requests PATIENT ON FOLLOWING MEROPENEM  Final   Gram Stain   Final    RARE WBC PRESENT, PREDOMINANTLY MONONUCLEAR NO ORGANISMS SEEN Performed at [facility], [place] [address], [place], [place] [address]    Culture PENDING  Incomplete   Report Status PENDING  Incomplete    April  A Peterson, [place] [facility] [phone] pager   [phone] cell 06/08/2018, 9:21 AM

## 2018-06-08 NOTE — Progress Notes (Signed)
[facility] for Infectious Disease   Reason for visit: Follow up on wound infection  Interval History: some growth of Proteus on initial swab from 11/1, OR cultures now also with Proteus; afebrile, WBC 18.4.  No associated n/v, no rash.    Physical Exam: Constitutional:  Vitals:   06/08/18 0840 06/08/18 1138  BP:    Pulse: 60   Resp:    Temp: 98 F (36.7 C) 97.7 F (36.5 C)  SpO2: 100%    patient appears in NAD, up in chair again today Eyes: anicteric Respiratory: Normal respiratory effort; CTA B Cardiovascular: RRR GI: soft, nt, nd  Review of Systems: Constitutional: negative for fevers and chills Gastrointestinal: negative for nausea and diarrhea Integument/breast: negative for rash  Lab Results  Component Value Date   WBC 18.4 (H) 06/08/2018   HGB 8.4 (L) 06/08/2018   HCT 27.6 (L) 06/08/2018   MCV 83.9 06/08/2018   PLT 362 06/08/2018    Lab Results  Component Value Date   CREATININE 2.21 (H) 06/08/2018   BUN 58 (H) 06/08/2018   NA 134 (L) 06/08/2018   K 4.3 06/08/2018   CL 105 06/08/2018   CO2 22 06/08/2018    Lab Results  Component Value Date   ALT 10 06/08/2018   AST 16 06/08/2018   ALKPHOS 63 06/08/2018     Microbiology: Recent Results (from the past 240 hour(s))  Aerobic/Anaerobic Culture (surgical/deep wound)     Status: None (Preliminary result)   Collection Time: 06/04/18  6:11 AM  Result Value Ref Range Status   Specimen Description   Final    BACK Performed at [facility], [address] [place], [place] [address]    Special Requests   Final    Normal Performed at [facility], [address] [place], [place] [address]    Gram Stain   Final    RARE WBC PRESENT, PREDOMINANTLY PMN RARE GRAM POSITIVE RODS RARE GRAM POSITIVE COCCI    Culture   Final    FEW PROTEUS MIRABILIS NO ANAEROBES ISOLATED; CULTURE IN PROGRESS FOR 5 DAYS SUSCEPTIBILITIES TO FOLLOW Performed at [facility], [place] [address], [place], [place] [address]    Report Status PENDING  Incomplete  MRSA PCR Screening     Status: None   Collection Time: 06/04/18  6:13 AM  Result Value Ref Range Status   MRSA by PCR NEGATIVE NEGATIVE Final    Comment:        The GeneXpert MRSA Assay (FDA approved for NASAL specimens only), is one component of a comprehensive MRSA colonization surveillance program. It is not intended to diagnose MRSA infection nor to guide or monitor treatment for MRSA infections. Performed at [facility], [address] [place], [place] [address]   Culture, blood (Routine X 2) w Reflex to ID Panel     Status: None (Preliminary result)   Collection Time: 06/04/18 11:15 PM  Result Value Ref Range Status   Specimen Description BLOOD RIGHT HAND  Final   Special Requests   Final    BOTTLES DRAWN AEROBIC AND ANAEROBIC Blood Culture adequate volume   Culture   Final    NO GROWTH 4 DAYS Performed at [facility], [address], [place], [place] [address]    Report Status PENDING  Incomplete  Culture, blood (Routine X 2) w Reflex to ID Panel     Status: None (Preliminary result)   Collection Time: 06/04/18 11:17 PM  Result Value Ref Range Status   Specimen Description BLOOD LEFT ANTECUBITAL  Final   Special Requests   Final    BOTTLES DRAWN AEROBIC AND ANAEROBIC Blood Culture adequate volume   Culture   Final    NO GROWTH 4 DAYS Performed at [facility], [address] [place], [place] [address]    Report Status PENDING  Incomplete  MRSA PCR Screening     Status: None   Collection Time: 06/05/18  4:37 PM  Result Value Ref Range Status   MRSA by PCR NEGATIVE NEGATIVE Final    Comment:        The GeneXpert MRSA Assay (FDA approved for NASAL specimens only), is one component of a comprehensive MRSA colonization surveillance program. It is not intended to diagnose MRSA infection nor to guide or monitor treatment for MRSA infections. Performed at [facility], [place] [address] [place], [place] [address]   Aerobic Culture (superficial specimen)     Status: None (Preliminary result)   Collection Time: 06/06/18  8:30 AM  Result Value Ref Range Status   Specimen Description WOUND LUMBAR  Final   Special Requests PATIENT ON FOLLOWING MEROPENEM  Final   Gram Stain   Final    RARE WBC PRESENT, PREDOMINANTLY MONONUCLEAR NO ORGANISMS SEEN    Culture   Final    FEW PROTEUS MIRABILIS SUSCEPTIBILITIES TO FOLLOW Performed at [facility], [place] [address] [place], [place] [address]    Report Status PENDING  Incomplete    Impression/Plan:  1. Superficial Wound infection - s/p debridement.  Growth of Proteus again from 11/1 and in operative cultures.  Previous imipenem resistance.  Changed to ceftriaxone.  Will continue for now, convert to oral therapy based on sensitivities, if possible.    2.  Medication monitoring - a bit more elevated today.  Will continue to monitor.   3.  Leukocytosis - likely reactive with recent surgery.  Some improvement today.  Will continue to monitor.

## 2018-06-08 NOTE — Progress Notes (Signed)
PROGRESS NOTE                                                                                                                                                                                                             Patient Demographics:    Taylor Tyler, is a 77 y.o. female, DOB - 05-05-1941, CHE:[id number]  Admit date - 06/05/2018   Admitting Physician Guilford Shi, MD  Outpatient Primary MD for the patient is McLean-Scocuzza, Nino Glow, MD  LOS - 3  CC back pain     Brief Narrative    Taylor Tyler  is a 77 y.o. AA female with H/O Morbid Obesity, CKD 3, DM2, HTN, anemia of chronic disease, fatty liver, hypothyroidism, status post L spine surgery along with L5-S1 fusion done in July 2018 at [facility] by Dr. Cyndy Freeze, lives in [facility] by herself and presented to [facility] hospital few days ago with 1 month duration of low back pain mostly on the left side radiating down to her left leg and some discharge from her postop back scar, at the hospital she was diagnosed with postop site infection and abscess with pus drainage, poorly controlled blood pressure, she was admitted to the hospital seen by ID, her case was discussed by neurosurgery at [facility] and then she was transferred here for further care.   Subjective:   Patient in bed, appears comfortable, denies any headache, no fever, no chest pain or pressure, no shortness of breath , no abdominal pain. No focal weakness. L sided low back pain is better.  She would like to be discharged home rather than SNF when better.   Assessment  & Plan :     1. Post op L.Spine Abscess - with MRI showing air-fluid levels within the paraspinal muscles and subcutaneous fat along the surgical approach and within the surgical bed, currently does have leukocytosis however no evidence of frank sepsis.    This likely is subacute to chronic infection, she has been placed on broad-spectrum IV vancomycin and meropenem,  ID and neurosurgery are following the patient, she underwent incision and drainage by neurosurgery on 06/06/2018.  Drain in place.  So far wound cultures growing Proteus, also reviewed cultures from 2018 which grew Proteus which was pansensitive except for imipenem, have stopped vancomycin and meropenem on 06/08/2018 and placed on Rocephin IV, discussed with Dr. Alton Revere.  Continue to monitor clinically.  Likely will require 6 weeks of antibiotics but will defer to ID.  New PT, may be discharged in the next 1 to 2 days depending on clinical course and ID input.  2.  Poorly controlled hypertension.  Now has been placed on oral Coreg, Catapres, Norvasc, Imdur and hydralazine combination with good effect.  Continue this combination upon discharge.  She has no significant hydralazine allergy has tolerated it well.  3.  ARF on CKD 4.  Baseline creatinine is 1.6.  Renal ultrasound shows CKD, continue to monitor urine output and renal function.  Creatinine peaked around 2.4 and now gradually trending down.  4.  Mild acute on chronic diastolic dysfunction grade 2.  EF 55 to 60% on echocardiogram done this month at [facility]  Resume home dose Lasix and monitor.  5. Hypothyroidism.  Resume home dose Synthroid which appears to be 112 mcg.  6.  Anemia of chronic disease - stable anemia panel, received 1 unit of packed RBC transfusion on 06/05/2018.  No signs of active bleeding, H&H remained stable will continue to monitor.  7.  Mild post op Leukocytosis - afebrile and nonspecific, is on good antibiotics for #1 above, chest x-ray remains stable postop and will add I-S.  Will monitor the trend.  8. DM II.  Check A1c, sliding scale for now.  CBG (last 3)  Recent Labs    06/06/18 0922 06/07/18 0823 06/08/18 0744  GLUCAP 101* 169* 136*     Family Communication  :  Son bedside  Code Status :  Full  Disposition Plan  :  SNF  Consults  :  N.Surg, ID  Procedures  :    06/06/18 - Exploration, debridement,  and closure of lumbar wound  DVT Prophylaxis  :    Heparin   Lab Results  Component Value Date   PLT 362 06/08/2018    Diet :  Diet Order            Diet Heart Room service appropriate? Yes; Fluid consistency: Thin  Diet effective now               Inpatient Medications Scheduled Meds: . sodium chloride   Intravenous Once  . amLODipine  10 mg Oral Daily  . carvedilol  25 mg Oral BID WC  . cloNIDine  0.1 mg Oral TID  . heparin  5,000 Units Subcutaneous Q8H  . hydrALAZINE  50 mg Oral Q8H  . isosorbide mononitrate  60 mg Oral Daily  . levothyroxine  112 mcg Oral Q0600  . sodium chloride flush  3 mL Intravenous Q12H   Continuous Infusions: . [START ON 06/09/2018] cefTRIAXone (ROCEPHIN)  IV    . cefTRIAXone (ROCEPHIN)  IV     PRN Meds:.diphenhydrAMINE, HYDROcodone-acetaminophen, labetalol, morphine injection  Antibiotics  :   Anti-infectives (From admission, onward)   Start     Dose/Rate Route Frequency Ordered Stop   06/09/18 0800  cefTRIAXone (ROCEPHIN) 2 g in sodium chloride 0.9 % 100 mL IVPB     2 g 200 mL/hr over 30 Minutes Intravenous Every 24 hours 06/08/18 0805     06/09/18 0700  cefTRIAXone (ROCEPHIN) 1 g in sodium chloride 0.9 % 100 mL IVPB  Status:  Discontinued     1 g 200 mL/hr over 30 Minutes Intravenous Every 24 hours 06/08/18 0757 06/08/18 0804   06/08/18 0845  cefTRIAXone (ROCEPHIN) 2 g in sodium chloride 0.9 % 100 mL IVPB     2 g 200 mL/hr over 30 Minutes Intravenous  Once 06/08/18 0805     06/08/18 0715  cefTRIAXone (ROCEPHIN)  1 g in sodium chloride 0.9 % 100 mL IVPB  Status:  Discontinued     1 g 200 mL/hr over 30 Minutes Intravenous  Once 06/08/18 0705 06/08/18 0805   06/06/18 2200  meropenem (MERREM) 500 mg in sodium chloride 0.9 % 100 mL IVPB  Status:  Discontinued     500 mg 200 mL/hr over 30 Minutes Intravenous Every 12 hours 06/06/18 1349 06/08/18 0756   06/06/18 0837  bacitracin 50,000 Units in sodium chloride 0.9 % 500 mL irrigation   Status:  Discontinued       As needed 06/06/18 0838 06/06/18 0917   06/05/18 2200  meropenem (MERREM) 1 g in sodium chloride 0.9 % 100 mL IVPB  Status:  Discontinued     1 g 200 mL/hr over 30 Minutes Intravenous Every 12 hours 06/05/18 1442 06/06/18 1349   06/05/18 1800  vancomycin (VANCOCIN) IVPB 1000 mg/200 mL premix  Status:  Discontinued     1,000 mg 200 mL/hr over 60 Minutes Intravenous Every 24 hours 06/05/18 1440 06/08/18 0756          Objective:   Vitals:   06/07/18 2331 06/08/18 0543 06/08/18 0745 06/08/18 0840  BP: (!) 153/63 (!) 179/78 (!) 165/73   Pulse:    60  Resp: 18     Temp: (!) 97.5 F (36.4 C) (!) 97.5 F (36.4 C) 98 F (36.7 C) 98 F (36.7 C)  TempSrc: Oral Axillary Oral   SpO2:  100%  100%  Weight:        Wt Readings from Last 3 Encounters:  06/05/18 106 kg  06/05/18 106.3 kg  04/19/18 99.9 kg     Intake/Output Summary (Last 24 hours) at 06/08/2018 1002 Last data filed at 06/08/2018 0300 Gross per 24 hour  Intake 3719.57 ml  Output -  Net 3719.57 ml     Physical Exam  Awake Alert, Oriented X 3, No new F.N deficits, continues to have mild left leg weakness, normal affect [place].AT,PERRAL Supple Neck,No JVD, No cervical lymphadenopathy appriciated.  Symmetrical Chest wall movement, Good air movement bilaterally, CTAB RRR,No Gallops, Rubs or new Murmurs, No Parasternal Heave +ve B.Sounds, Abd Soft, No tenderness, No organomegaly appriciated, No rebound - guarding or rigidity. No Cyanosis, Clubbing or edema, No new Rash or bruise,  Back post op site stable, drain in place    Data Review:    CBC  Recent Labs  Lab 06/04/18 0245  06/05/18 0522 06/05/18 1455 06/06/18 0314 06/07/18 0432 06/08/18 0457  WBC 17.7*   < > 15.6* 15.9* 15.0* 21.6* 18.4*  HGB 7.7*   < > 7.0* 7.0* 8.1* 7.9* 8.4*  HCT 24.8*   < > 21.9* 22.3* 25.3* 25.7* 27.6*  PLT 456*   < > 367 383 352 386 362  MCV 84.6   < > 83.6 83.5 81.4 82.9 83.9  MCH 26.3   < > 26.7 26.2  26.0 25.5* 25.5*  MCHC 31.0   < > 32.0 31.4 32.0 30.7 30.4  RDW 15.9*   < > 16.1* 16.0* 15.3 15.9* 15.9*  LYMPHSABS 1.2  --   --   --   --   --   --   MONOABS 0.6  --   --   --   --   --   --   EOSABS 0.1  --   --   --   --   --   --   BASOSABS 0.0  --   --   --   --   --   --    < > =  values in this interval not displayed.    Chemistries  Recent Labs  Lab 06/04/18 0245  06/05/18 0522 06/05/18 1455 06/06/18 0314 06/06/18 1129 06/07/18 0432 06/08/18 0457  NA 138   < > 138 136 136  --  135 134*  K 3.9   < > 3.9 3.6 4.0  --  4.4 4.3  CL 104   < > 104 105 107  --  105 105  CO2 25   < > 25 25 23   --  25 22  GLUCOSE 114*   < > 115* 112* 92  --  136* 122*  BUN 58*   < > 52* 46* 45*  --  47* 58*  CREATININE 2.45*   < > 2.23* 2.19* 2.10*  --  1.94* 2.21*  CALCIUM 8.7*   < > 8.1* 8.3* 8.3*  --  8.7* 8.3*  MG  --   --   --   --  1.8 1.8 1.9  --   AST 17  --   --  15 13*  --  13* 16  ALT 12  --   --  11 11  --  11 10  ALKPHOS 75  --   --  58 60  --  58 63  BILITOT 0.6  --   --  0.6 0.6  --  0.7 0.6   < > = values in this interval not displayed.   ------------------------------------------------------------------------------------------------------------------ No results for input(s): CHOL, HDL, LDLCALC, TRIG, CHOLHDL, LDLDIRECT in the last 72 hours.  Lab Results  Component Value Date   HGBA1C 6.4 (H) 06/05/2018   ------------------------------------------------------------------------------------------------------------------ No results for input(s): TSH, T4TOTAL, T3FREE, THYROIDAB in the last 72 hours.  Invalid input(s): FREET3 ------------------------------------------------------------------------------------------------------------------ Recent Labs    06/05/18 1455  VITAMINB12 1,085*  FOLATE 8.8  FERRITIN 714*  TIBC 153*  IRON 29  RETICCTPCT 1.1    Coagulation profile Recent Labs  Lab 06/04/18 0636 06/05/18 1455  INR 1.38 1.45    No results for input(s):  DDIMER in the last 72 hours.  Cardiac Enzymes Recent Labs  Lab 06/04/18 0636 06/04/18 1315 06/04/18 2316  TROPONINI 0.03* 0.07* 0.03*   ------------------------------------------------------------------------------------------------------------------    Component Value Date/Time   BNP 1,059.0 (H) 06/04/2018 0245    Micro Results Recent Results (from the past 240 hour(s))  Aerobic/Anaerobic Culture (surgical/deep wound)     Status: None (Preliminary result)   Collection Time: 06/04/18  6:11 AM  Result Value Ref Range Status   Specimen Description   Final    BACK Performed at [facility], [address], [place], [place] [address]    Special Requests   Final    Normal Performed at [facility], [address] [place], [place] [address]    Gram Stain   Final    RARE WBC PRESENT, PREDOMINANTLY PMN RARE GRAM POSITIVE RODS RARE GRAM POSITIVE COCCI Performed at [facility], [place] [address] [place], [place] [address]    Culture   Final    FEW PROTEUS MIRABILIS NO ANAEROBES ISOLATED; CULTURE IN PROGRESS FOR 5 DAYS    Report Status PENDING  Incomplete  MRSA PCR Screening     Status: None   Collection Time: 06/04/18  6:13 AM  Result Value Ref Range Status   MRSA by PCR NEGATIVE NEGATIVE Final    Comment:        The GeneXpert MRSA Assay (FDA approved for NASAL specimens only), is one component of a comprehensive MRSA  colonization surveillance program. It is not intended to diagnose MRSA infection nor to guide or monitor treatment for MRSA infections. Performed at [facility], [address] [place], [place] [address]   Culture, blood (Routine X 2) w Reflex to ID Panel     Status: None (Preliminary result)   Collection Time: 06/04/18 11:15 PM  Result Value Ref Range Status   Specimen Description BLOOD RIGHT HAND  Final   Special Requests   Final    BOTTLES DRAWN AEROBIC AND ANAEROBIC Blood Culture adequate volume   Culture    Final    NO GROWTH 4 DAYS Performed at [facility], [address], [place], [place] [address]    Report Status PENDING  Incomplete  Culture, blood (Routine X 2) w Reflex to ID Panel     Status: None (Preliminary result)   Collection Time: 06/04/18 11:17 PM  Result Value Ref Range Status   Specimen Description BLOOD LEFT ANTECUBITAL  Final   Special Requests   Final    BOTTLES DRAWN AEROBIC AND ANAEROBIC Blood Culture adequate volume   Culture   Final    NO GROWTH 4 DAYS Performed at [facility], [address], [place], [place] [address]    Report Status PENDING  Incomplete  MRSA PCR Screening     Status: None   Collection Time: 06/05/18  4:37 PM  Result Value Ref Range Status   MRSA by PCR NEGATIVE NEGATIVE Final    Comment:        The GeneXpert MRSA Assay (FDA approved for NASAL specimens only), is one component of a comprehensive MRSA colonization surveillance program. It is not intended to diagnose MRSA infection nor to guide or monitor treatment for MRSA infections. Performed at [facility], [place] [address] [place], [place] [address]   Aerobic Culture (superficial specimen)     Status: None (Preliminary result)   Collection Time: 06/06/18  8:30 AM  Result Value Ref Range Status   Specimen Description WOUND LUMBAR  Final   Special Requests PATIENT ON FOLLOWING MEROPENEM  Final   Gram Stain   Final    RARE WBC PRESENT, PREDOMINANTLY MONONUCLEAR NO ORGANISMS SEEN Performed at [facility], [place] [address], [place], [place] [address]    Culture PENDING  Incomplete   Report Status PENDING  Incomplete    Radiology Reports Dg Chest 2 View  Result Date: 06/04/2018 CLINICAL DATA:  77 year old female with shortness of breath. EXAM: CHEST - 2 VIEW COMPARISON:  Chest radiograph dated 03/29/2017 FINDINGS: There is moderate cardiomegaly. Minimal vascular congestion. No edema. No focal consolidation, pleural effusion, or pneumothorax. No  acute osseous pathology. IMPRESSION: Moderate cardiomegaly with minimal vascular congestion. No edema or focal consolidation. Electronically Signed   By: Anner Crete M.D.   On: 06/04/2018 04:01   Dg Lumbar Spine Complete  Result Date: 06/04/2018 CLINICAL DATA:  77 year old female with chronic lower back pain. EXAM: LUMBAR SPINE - COMPLETE 4+ VIEW COMPARISON:  Lumbar spine MRI dated 03/30/2018 FINDINGS: There is no acute fracture or subluxation of the lumbar spine. There is osteopenia with multilevel degenerative changes. L3-S1 disc spacers and posterior fixation rod and screws as well as L5-S1 anterior fusion noted. Overall these findings are similar to the radiograph of 03/09/2018. Apparent large pocket of air along the posterior lower lumbar spine at L4-S1 and along the fixation hardware likely represents superimposed bowel. Clinical correlation is recommended. There is atherosclerotic calcification of the aorta. IMPRESSION: 1. No  acute fracture or subluxation of the lumbar spine. Stable postoperative changes. 2. A pocket of air over the hardware, likely superimposed bowel. Electronically Signed   By: Anner Crete M.D.   On: 06/04/2018 04:09   Mr Lumbar Spine Wo Contrast  Result Date: 06/04/2018 CLINICAL DATA:  Fluid leaking from spinal wound incision, history of multiple lumbar spine surgeries. Status post lumbar spine wound debridement March 30, 2017. EXAM: MRI LUMBAR SPINE WITHOUT CONTRAST TECHNIQUE: Multiplanar, multisequence MR imaging of the lumbar spine was performed. No intravenous contrast was administered. COMPARISON:  Lumbar spine radiographs June 04, 2018 and MRI lumbar spine March 30, 2018 FINDINGS: Moderately motion degraded examination. SEGMENTATION: For the purposes of this report, the last well-formed intervertebral disc is reported as L5-S1. ALIGNMENT: Maintained lumbar lordosis. Minimal grade 1 L2-3 retrolisthesis. No spondylolysis. VERTEBRAE:Status post L3 through S1 PLIF,  susceptibility artifact from hardware. Mild L1-2 disc height loss with mild disc desiccation. No suspicious or acute bone marrow signal the limited by hardware artifact and patient motion. CONUS MEDULLARIS AND CAUDA EQUINA: Conus medullaris terminates at L1-2 and demonstrates normal morphology and signal characteristics. Limited assessment of cauda equina due to hardware artifact common no nerve root clumping. PARASPINAL AND OTHER SOFT TISSUES: Multiple air-fluid levels within the paraspinal muscles and subcutaneous fat along surgical approach and within surgical bed. Postoperative paraspinal muscle denervation with superimposed RIGHT interstitial STIR signal. DISC LEVELS (limited assessment due to hardware artifact and motion): L1-2: Small broad-based disc bulge, mild facet arthropathy and ligamentum flavum redundancy. No canal stenosis. Mild bilateral neural foraminal narrowing. L2-3: Retrolisthesis. Moderate broad-based disc bulge. Moderate canal stenosis. Mild neural foraminal narrowing may be overestimated by hardware artifact. L3-4: PLIF, posterior decompression. No canal stenosis or definite neural foraminal narrowing. L4-5: PLIF and posterior decompression. No canal stenosis. Moderate possible LEFT neural foraminal narrowing, potentially overestimated by hardware artifact. L5-S1: PLIF and posterior decompression. No canal stenosis. Small suspected central disc extrusion, stable from prior examination. No definite neural foraminal narrowing. IMPRESSION: 1. Air-fluid levels within paraspinal muscles and subcutaneous fat along surgical approach and within surgical bed, query recent intervention, alternatively this could reflect infection. Additional paraspinal muscle denervation versus myositis. 2. Status post L3 through S1 PLIF and posterior decompression. Minimal grade 1 L2-3 retrolisthesis. 3. Moderate canal stenosis L2-3. Multilevel suspected neural foraminal narrowing, potentially moderate on the LEFT at  L4-5. 4. These results will be called to the ordering clinician or representative by the Radiologist Assistant, and communication documented in the PACS or zVision Dashboard. Electronically Signed   By: Elon Alas M.D.   On: 06/04/2018 23:14   US Renal  Result Date: 06/04/2018 CLINICAL DATA:  Acute renal failure EXAM: RENAL / URINARY TRACT ULTRASOUND COMPLETE COMPARISON:  CT 03/29/2017 FINDINGS: Right Kidney: Renal measurements: 9.6 x 5.7 x 5.2 cm = volume: 142 mL. Diffusely increased echotexture. No mass or hydronephrosis. Left Kidney: Renal measurements: 9.1 x 4.4 x 3.4 cm = volume: 71 mL. Diffusely increased echotexture. No mass or hydronephrosis. Bladder: Not visualized, decompressed with Foley catheter in place. IMPRESSION: Atrophic, echogenic kidneys compatible with chronic medical renal disease. No hydronephrosis. Electronically Signed   By: Rolm Baptise M.D.   On: 06/04/2018 09:48   Dg Chest Port 1 View  Result Date: 06/07/2018 CLINICAL DATA:  Shortness of breath. EXAM: PORTABLE CHEST 1 VIEW COMPARISON:  06/05/2018, 07/06/2016 and chest CT 03/29/2017 FINDINGS: Lungs are adequately inflated without focal airspace consolidation or effusion. Minimal prominence of the perihilar markings. Pleural based mass over the lateral right  lower thorax which may be slightly larger, although previous CT suggests this represents a lipoma. Moderate stable cardiomegaly. Remainder of the exam is unchanged. IMPRESSION: Moderate stable cardiomegaly with suggestion mild vascular congestion. Pleural base mass over the lateral right lower thorax which may be slightly larger, although shown to represent a lipoma by previous CT. Electronically Signed   By: Marin Olp M.D.   On: 06/07/2018 07:23   Dg Chest Port 1 View  Result Date: 06/05/2018 CLINICAL DATA:  SOB. Best obtainable image EXAM: PORTABLE CHEST 1 VIEW COMPARISON:  Radiograph 06/04/2018 FINDINGS: Stable enlarged cardiac silhouette. Central venous  pulmonary congestion. No effusion, infiltrate pneumothorax. IMPRESSION: Cardiomegaly and central venous congestion.  No interval change. Electronically Signed   By: Suzy Bouchard M.D.   On: 06/05/2018 14:54    Time Spent in minutes  30   Lala Lund M.D on 06/08/2018 at 10:02 AM  To page go to [email or web address] - password [email or web address]

## 2018-06-09 DIAGNOSIS — Z9889 Other specified postprocedural states: Secondary | ICD-10-CM

## 2018-06-09 DIAGNOSIS — E7849 Other hyperlipidemia: Secondary | ICD-10-CM

## 2018-06-09 DIAGNOSIS — Z978 Presence of other specified devices: Secondary | ICD-10-CM

## 2018-06-09 DIAGNOSIS — T8463XA Infection and inflammatory reaction due to internal fixation device of spine, initial encounter: Secondary | ICD-10-CM

## 2018-06-09 DIAGNOSIS — N179 Acute kidney failure, unspecified: Secondary | ICD-10-CM

## 2018-06-09 DIAGNOSIS — I10 Essential (primary) hypertension: Secondary | ICD-10-CM

## 2018-06-09 DIAGNOSIS — D649 Anemia, unspecified: Secondary | ICD-10-CM

## 2018-06-09 LAB — COMPREHENSIVE METABOLIC PANEL
ALT: 13 U/L (ref 0–44)
AST: 18 U/L (ref 15–41)
Albumin: 2 g/dL — ABNORMAL LOW (ref 3.5–5.0)
Alkaline Phosphatase: 61 U/L (ref 38–126)
Anion gap: 8 (ref 5–15)
BILIRUBIN TOTAL: 0.6 mg/dL (ref 0.3–1.2)
BUN: 54 mg/dL — ABNORMAL HIGH (ref 8–23)
CO2: 26 mmol/L (ref 22–32)
CREATININE: 1.99 mg/dL — AB (ref 0.44–1.00)
Calcium: 8.8 mg/dL — ABNORMAL LOW (ref 8.9–10.3)
Chloride: 104 mmol/L (ref 98–111)
GFR calc Af Amer: 27 mL/min — ABNORMAL LOW (ref >60)
GFR, EST NON AFRICAN AMERICAN: 23 mL/min — AB (ref >60)
Glucose, Bld: 90 mg/dL (ref 70–99)
POTASSIUM: 5 mmol/L (ref 3.5–5.1)
Sodium: 138 mmol/L (ref 135–145)
TOTAL PROTEIN: 6.7 g/dL (ref 6.5–8.1)

## 2018-06-09 LAB — TYPE AND SCREEN
ABO/RH(D): A POS
Antibody Screen: NEGATIVE
UNIT DIVISION: 0
Unit division: 0
Unit division: 0

## 2018-06-09 LAB — BPAM RBC
BLOOD PRODUCT EXPIRATION DATE: 201911282359
BLOOD PRODUCT EXPIRATION DATE: 201911292359
Blood Product Expiration Date: 201911292359
ISSUE DATE / TIME: 201911022110
UNIT TYPE AND RH: 6200
Unit Type and Rh: 6200
Unit Type and Rh: 6200

## 2018-06-09 LAB — CBC
HCT: 31.4 % — ABNORMAL LOW (ref 36.0–46.0)
HEMOGLOBIN: 9.7 g/dL — AB (ref 12.0–15.0)
MCH: 25.9 pg — ABNORMAL LOW (ref 26.0–34.0)
MCHC: 30.9 g/dL (ref 30.0–36.0)
MCV: 84 fL (ref 80.0–100.0)
NRBC: 0 % (ref 0.0–0.2)
PLATELETS: 392 10*3/uL (ref 150–400)
RBC: 3.74 MIL/uL — ABNORMAL LOW (ref 3.87–5.11)
RDW: 15.9 % — ABNORMAL HIGH (ref 11.5–15.5)
WBC: 19 10*3/uL — ABNORMAL HIGH (ref 4.0–10.5)

## 2018-06-09 LAB — CULTURE, BLOOD (ROUTINE X 2)
CULTURE: NO GROWTH
Culture: NO GROWTH
SPECIAL REQUESTS: ADEQUATE
SPECIAL REQUESTS: ADEQUATE

## 2018-06-09 LAB — AEROBIC/ANAEROBIC CULTURE (SURGICAL/DEEP WOUND)

## 2018-06-09 LAB — AEROBIC CULTURE  (SUPERFICIAL SPECIMEN)

## 2018-06-09 LAB — AEROBIC CULTURE W GRAM STAIN (SUPERFICIAL SPECIMEN)

## 2018-06-09 LAB — MAGNESIUM: MAGNESIUM: 1.9 mg/dL (ref 1.7–2.4)

## 2018-06-09 LAB — AEROBIC/ANAEROBIC CULTURE W GRAM STAIN (SURGICAL/DEEP WOUND): Special Requests: NORMAL

## 2018-06-09 NOTE — Progress Notes (Signed)
[facility]   Reason for visit: Follow up on wound infection  Interval History: some growth of Proteus on initial swab from 11/1, OR cultures now also with Proteus and is pansensitive; afebrile, WBC still up at 19.0.  No associated n/v, no rash.    Physical Exam: Constitutional:  Vitals:   06/09/18 0802 06/09/18 1124  BP: (!) 190/74 (!) 158/58  Pulse: 62   Resp:    Temp: 97.7 F (36.5 C) (!) 97.3 F (36.3 C)  SpO2: 98%    patient appears in NAD Eyes: anicteric Respiratory: Normal respiratory effort; CTA B Cardiovascular: RRR GI: soft, nt, nd  Review of Systems: Constitutional: negative for fevers and chills Gastrointestinal: negative for nausea and diarrhea Integument/breast: negative for rash  Lab Results  Component Value Date   WBC 19.0 (H) 06/09/2018   HGB 9.7 (L) 06/09/2018   HCT 31.4 (L) 06/09/2018   MCV 84.0 06/09/2018   PLT 392 06/09/2018    Lab Results  Component Value Date   CREATININE 1.99 (H) 06/09/2018   BUN 54 (H) 06/09/2018   NA 138 06/09/2018   K 5.0 06/09/2018   CL 104 06/09/2018   CO2 26 06/09/2018    Lab Results  Component Value Date   ALT 13 06/09/2018   AST 18 06/09/2018   ALKPHOS 61 06/09/2018     Microbiology: Recent Results (from the past 240 hour(s))  Aerobic/Anaerobic Culture (surgical/deep wound)     Status: None   Collection Time: 06/04/18  6:11 AM  Result Value Ref Range Status   Specimen Description   Final    BACK Performed at [facility], [address], [place], [place] [address]    Special Requests   Final    Normal Performed at [facility], [address] [place], [place] [address]    Gram Stain   Final    RARE WBC PRESENT, PREDOMINANTLY PMN RARE GRAM POSITIVE RODS RARE GRAM POSITIVE COCCI    Culture   Final    FEW PROTEUS MIRABILIS NO ANAEROBES ISOLATED Performed at [facility], [place] [address] [place], [place] [address]    Report Status  06/09/2018 FINAL  Final   Organism ID, Bacteria PROTEUS MIRABILIS  Final      Susceptibility   Proteus mirabilis - MIC*    AMPICILLIN <=2 SENSITIVE Sensitive     CEFAZOLIN <=4 SENSITIVE Sensitive     CEFEPIME <=1 SENSITIVE Sensitive     CEFTAZIDIME <=1 SENSITIVE Sensitive     CEFTRIAXONE <=1 SENSITIVE Sensitive     CIPROFLOXACIN <=0.25 SENSITIVE Sensitive     GENTAMICIN <=1 SENSITIVE Sensitive     IMIPENEM 8 INTERMEDIATE Intermediate     TRIMETH/SULFA <=20 SENSITIVE Sensitive     AMPICILLIN/SULBACTAM <=2 SENSITIVE Sensitive     PIP/TAZO <=4 SENSITIVE Sensitive     * FEW PROTEUS MIRABILIS  MRSA PCR Screening     Status: None   Collection Time: 06/04/18  6:13 AM  Result Value Ref Range Status   MRSA by PCR NEGATIVE NEGATIVE Final    Comment:        The GeneXpert MRSA Assay (FDA approved for NASAL specimens only), is one component of a comprehensive MRSA colonization surveillance program. It is not intended to diagnose MRSA infection nor to guide or monitor treatment for MRSA infections. Performed at [facility], [address] [place], [place] [address]   Culture, blood (Routine X 2) w Reflex to ID Panel  Status: None   Collection Time: 06/04/18 11:15 PM  Result Value Ref Range Status   Specimen Description BLOOD RIGHT HAND  Final   Special Requests   Final    BOTTLES DRAWN AEROBIC AND ANAEROBIC Blood Culture adequate volume   Culture   Final    NO GROWTH 5 DAYS Performed at [facility], [address] [place], [place] [address]    Report Status 06/09/2018 FINAL  Final  Culture, blood (Routine X 2) w Reflex to ID Panel     Status: None   Collection Time: 06/04/18 11:17 PM  Result Value Ref Range Status   Specimen Description BLOOD LEFT ANTECUBITAL  Final   Special Requests   Final    BOTTLES DRAWN AEROBIC AND ANAEROBIC Blood Culture adequate volume   Culture   Final    NO GROWTH 5 DAYS Performed at [facility], [address] [place], [place] [address]    Report Status 06/09/2018 FINAL  Final  MRSA PCR Screening     Status: None   Collection Time: 06/05/18  4:37 PM  Result Value Ref Range Status   MRSA by PCR NEGATIVE NEGATIVE Final    Comment:        The GeneXpert MRSA Assay (FDA approved for NASAL specimens only), is one component of a comprehensive MRSA colonization surveillance program. It is not intended to diagnose MRSA infection nor to guide or monitor treatment for MRSA infections. Performed at [facility], [place] [address], [place], [place] [address]   Anaerobic culture     Status: None (Preliminary result)   Collection Time: 06/06/18  8:30 AM  Result Value Ref Range Status   Specimen Description WOUND LUMBAR  Final   Special Requests   Final    PATIENT ON FOLLOWING MEROPENEM Performed at [facility], [place] [address], [place], [place] [address]    Culture   Final    NO ANAEROBES ISOLATED; CULTURE IN PROGRESS FOR 5 DAYS   Report Status PENDING  Incomplete  Aerobic Culture (superficial specimen)     Status: None   Collection Time: 06/06/18  8:30 AM  Result Value Ref Range Status   Specimen Description WOUND LUMBAR  Final   Special Requests PATIENT ON FOLLOWING MEROPENEM  Final   Gram Stain   Final    RARE WBC PRESENT, PREDOMINANTLY MONONUCLEAR NO ORGANISMS SEEN Performed at [facility], [place] [address] [place], [place] [address]    Culture FEW PROTEUS MIRABILIS  Final   Report Status 06/09/2018 FINAL  Final   Organism ID, Bacteria PROTEUS MIRABILIS  Final      Susceptibility   Proteus mirabilis - MIC*    AMPICILLIN <=2 SENSITIVE Sensitive     CEFAZOLIN <=4 SENSITIVE Sensitive     CEFEPIME <=1 SENSITIVE Sensitive     CEFTAZIDIME <=1 SENSITIVE Sensitive     CEFTRIAXONE <=1 SENSITIVE Sensitive     CIPROFLOXACIN <=0.25 SENSITIVE Sensitive     GENTAMICIN <=1 SENSITIVE Sensitive     TRIMETH/SULFA <=20 SENSITIVE Sensitive     AMPICILLIN/SULBACTAM <=2 SENSITIVE  Sensitive     PIP/TAZO <=4 SENSITIVE Sensitive     * FEW PROTEUS MIRABILIS    Impression/Plan:  1. Superficial Wound infection - s/p debridement.  Growth of Proteus again from 11/1 and in operative cultures and no resistance.   Continue treatment with oral amoxicillin 500 mg three times a day for 2 more weeks through 11/20.    2.  Medication monitoring - creat a bit better today.    3.  Leukocytosis - likely reactive with recent surgery.  No other concerning signs.  Will continue to monitor.    I will sign off, call with questions.

## 2018-06-09 NOTE — Progress Notes (Signed)
Please seeing attending note of the same day    [facility]  Date of Admission:  06/05/2018   Total days of antibiotics 4        ASSESSMENT: 77 yo pt with hx of lumbar fusion in July 2018 with persistent drainage now s/p I&D on 11/3 with neurosurgery. OR cultures with proteus, sensitive to all abx. WBC stable at 19; afebrile. Previous proteus resistant to imipenem. Pt switched to ceftriaxone on 11/4. Drain in place with 260cc output past 24 hours. Fluid purulent/bloody. No rashes, n/v/d.   PLAN: 1. Continue ceftriaxone while IP; okay to switch to amoxicillin for oral regimen. 3 weeks total  Principal Problem:   Postoperative wound infection Active Problems:   HLD (hyperlipidemia)   Status post lumbar surgery   Obesity, Class III, BMI 40-49.9 (morbid obesity) (HCC)   DM (diabetes mellitus), type 2 with renal complications (HCC)   Anemia   Acute renal failure superimposed on stage 3 chronic kidney disease (HCC)   Essential hypertension   Infection and inflammatory reaction due to internal fixation device of spine, initial encounter ([place])   Scheduled Meds: . sodium chloride   Intravenous Once  . amLODipine  10 mg Oral Daily  . carvedilol  25 mg Oral BID WC  . cholecalciferol  1,000 Units Oral Daily  . cloNIDine  0.1 mg Oral TID  . furosemide  40 mg Oral BID  . heparin  5,000 Units Subcutaneous Q8H  . hydrALAZINE  50 mg Oral Q8H  . isosorbide mononitrate  60 mg Oral Daily  . levothyroxine  112 mcg Oral Q0600  . sodium chloride flush  3 mL Intravenous Q12H  . vitamin C  500 mg Oral Daily   Continuous Infusions: . cefTRIAXone (ROCEPHIN)  IV     PRN Meds:.diphenhydrAMINE, HYDROcodone-acetaminophen, labetalol, morphine injection   SUBJECTIVE: Pt reports she feels better, denies fever/chills. No n/v/d. Ambulating w/o difficulty and tolerating PO.   Review of Systems: Review of Systems  Constitutional: Negative for chills and fever.    Cardiovascular: Negative for chest pain and leg swelling.  Gastrointestinal: Negative for abdominal pain, diarrhea, nausea and vomiting.  Musculoskeletal: Positive for back pain.  Skin: Negative for itching and rash.    Allergies  Allergen Reactions  . Clams [Shellfish Allergy] Swelling and Other (See Comments)    THROAT SWELLS NECK TURNS RED  . Hydralazine Other (See Comments)    CHEST TIGHTNESS Patient has tolerated multiple doses of hydralazine since allergy was listed   . Norvasc [Amlodipine Besylate] Swelling    Leg edema   . Milk-Related Compounds Diarrhea    OBJECTIVE: Vitals:   06/08/18 1625 06/08/18 1900 06/09/18 0556 06/09/18 0802  BP:  (!) 168/93 (!) 191/72 (!) 190/74  Pulse:    62  Resp:  17    Temp: 97.9 F (36.6 C) 97.7 F (36.5 C)  97.7 F (36.5 C)  TempSrc: Oral Oral  Oral  SpO2:  100%  98%  Weight:       Body mass index is 42.74 kg/m.  Physical Exam  Constitutional: She is oriented to person, place, and time. She appears well-developed and well-nourished.  HENT:  Head: Normocephalic and atraumatic.  Eyes: EOM are normal.  Neck: Normal range of motion.  Cardiovascular: Normal rate and regular rhythm.  Pulmonary/Chest: Effort normal and breath sounds normal. No respiratory distress.  Musculoskeletal: Normal range of motion.  Neurological: She is alert and oriented to person, place, and time.  Skin: Skin is warm and dry.  Psychiatric: She has a normal mood and affect.    Lab Results Lab Results  Component Value Date   WBC 19.0 (H) 06/09/2018   HGB 9.7 (L) 06/09/2018   HCT 31.4 (L) 06/09/2018   MCV 84.0 06/09/2018   PLT 392 06/09/2018    Lab Results  Component Value Date   CREATININE 1.99 (H) 06/09/2018   BUN 54 (H) 06/09/2018   NA 138 06/09/2018   K 5.0 06/09/2018   CL 104 06/09/2018   CO2 26 06/09/2018    Lab Results  Component Value Date   ALT 13 06/09/2018   AST 18 06/09/2018   ALKPHOS 61 06/09/2018   BILITOT 0.6 06/09/2018      Microbiology: Recent Results (from the past 240 hour(s))  Aerobic/Anaerobic Culture (surgical/deep wound)     Status: None (Preliminary result)   Collection Time: 06/04/18  6:11 AM  Result Value Ref Range Status   Specimen Description   Final    BACK Performed at [facility], [address] [place], [place] [address]    Special Requests   Final    Normal Performed at [facility], [address] [place], [place] [address]    Gram Stain   Final    RARE WBC PRESENT, PREDOMINANTLY PMN RARE GRAM POSITIVE RODS RARE GRAM POSITIVE COCCI Performed at [facility], [place] [address], [place], [place] [address]    Culture   Final    FEW PROTEUS MIRABILIS NO ANAEROBES ISOLATED; CULTURE IN PROGRESS FOR 5 DAYS    Report Status PENDING  Incomplete   Organism ID, Bacteria PROTEUS MIRABILIS  Final      Susceptibility   Proteus mirabilis - MIC*    AMPICILLIN <=2 SENSITIVE Sensitive     CEFAZOLIN <=4 SENSITIVE Sensitive     CEFEPIME <=1 SENSITIVE Sensitive     CEFTAZIDIME <=1 SENSITIVE Sensitive     CEFTRIAXONE <=1 SENSITIVE Sensitive     CIPROFLOXACIN <=0.25 SENSITIVE Sensitive     GENTAMICIN <=1 SENSITIVE Sensitive     IMIPENEM 8 INTERMEDIATE Intermediate     TRIMETH/SULFA <=20 SENSITIVE Sensitive     AMPICILLIN/SULBACTAM <=2 SENSITIVE Sensitive     PIP/TAZO <=4 SENSITIVE Sensitive     * FEW PROTEUS MIRABILIS  MRSA PCR Screening     Status: None   Collection Time: 06/04/18  6:13 AM  Result Value Ref Range Status   MRSA by PCR NEGATIVE NEGATIVE Final    Comment:        The GeneXpert MRSA Assay (FDA approved for NASAL specimens only), is one component of a comprehensive MRSA colonization surveillance program. It is not intended to diagnose MRSA infection nor to guide or monitor treatment for MRSA infections. Performed at [facility], [address] [place], [place] [address]   Culture, blood (Routine X 2) w Reflex to ID Panel      Status: None   Collection Time: 06/04/18 11:15 PM  Result Value Ref Range Status   Specimen Description BLOOD RIGHT HAND  Final   Special Requests   Final    BOTTLES DRAWN AEROBIC AND ANAEROBIC Blood Culture adequate volume   Culture   Final    NO GROWTH 5 DAYS Performed at [facility], [address], [place], [place] [address]    Report Status 06/09/2018 FINAL  Final  Culture, blood (Routine X 2) w Reflex to ID Panel     Status: None   Collection  Time: 06/04/18 11:17 PM  Result Value Ref Range Status   Specimen Description BLOOD LEFT ANTECUBITAL  Final   Special Requests   Final    BOTTLES DRAWN AEROBIC AND ANAEROBIC Blood Culture adequate volume   Culture   Final    NO GROWTH 5 DAYS Performed at [facility], [address] [place], [place] [address]    Report Status 06/09/2018 FINAL  Final  MRSA PCR Screening     Status: None   Collection Time: 06/05/18  4:37 PM  Result Value Ref Range Status   MRSA by PCR NEGATIVE NEGATIVE Final    Comment:        The GeneXpert MRSA Assay (FDA approved for NASAL specimens only), is one component of a comprehensive MRSA colonization surveillance program. It is not intended to diagnose MRSA infection nor to guide or monitor treatment for MRSA infections. Performed at [facility], [place] [address], [place], [name] [address]   Anaerobic culture     Status: None (Preliminary result)   Collection Time: 06/06/18  8:30 AM  Result Value Ref Range Status   Specimen Description WOUND LUMBAR  Final   Special Requests   Final    PATIENT ON FOLLOWING MEROPENEM Performed at [facility], [place] [address] [place], [place] [address]    Culture   Final    NO ANAEROBES ISOLATED; CULTURE IN PROGRESS FOR 5 DAYS   Report Status PENDING  Incomplete  Aerobic Culture (superficial specimen)     Status: None   Collection Time: 06/06/18  8:30 AM  Result Value Ref Range Status   Specimen Description WOUND LUMBAR  Final    Special Requests PATIENT ON FOLLOWING MEROPENEM  Final   Gram Stain   Final    RARE WBC PRESENT, PREDOMINANTLY MONONUCLEAR NO ORGANISMS SEEN Performed at [facility], [place] [address] [place], [place] [address]    Culture FEW PROTEUS MIRABILIS  Final   Report Status 06/09/2018 FINAL  Final   Organism ID, Bacteria PROTEUS MIRABILIS  Final      Susceptibility   Proteus mirabilis - MIC*    AMPICILLIN <=2 SENSITIVE Sensitive     CEFAZOLIN <=4 SENSITIVE Sensitive     CEFEPIME <=1 SENSITIVE Sensitive     CEFTAZIDIME <=1 SENSITIVE Sensitive     CEFTRIAXONE <=1 SENSITIVE Sensitive     CIPROFLOXACIN <=0.25 SENSITIVE Sensitive     GENTAMICIN <=1 SENSITIVE Sensitive     TRIMETH/SULFA <=20 SENSITIVE Sensitive     AMPICILLIN/SULBACTAM <=2 SENSITIVE Sensitive     PIP/TAZO <=4 SENSITIVE Sensitive     * FEW PROTEUS MIRABILIS    April  A Peterson, [place] [facility] [phone] pager   [phone] cell 06/09/2018, 9:16 AM

## 2018-06-09 NOTE — Progress Notes (Signed)
  NEUROSURGERY PROGRESS NOTE   No issues overnight.  LBP gradually improving No concerns this am  EXAM:  BP (!) 190/74 (BP Location: Left Arm)   Pulse 62   Temp 97.7 F (36.5 C) (Oral)   Resp 17   Wt 106 kg   SpO2 98%   BMI 42.74 kg/m   Awake, alert, oriented  Speech fluent, appropriate  CN grossly intact  MAEW with good strength Incision no active drainage Drain in place with 260cc output past 24 hours. Fluid purulent/bloody  PLAN 77 y.o. female pod #3 s/p lumbar wound exploration, debridement and closure of open wound. Neurologically stable. No drainage from wound.  - Continue hemovac - No new NS recs

## 2018-06-09 NOTE — Progress Notes (Signed)
PROGRESS NOTE    Taylor Tyler  MOL:[id number] DOB: [birth year] DOA: 06/05/2018 PCP: McLean-Scocuzza, Nino Glow, MD   Brief Narrative:  modelle vollmer y.o.AAfemalewith H/O Morbid Obesity, CKD 3, DM2, HTN,anemia of chronic disease, fatty liver, hypothyroidism, status post L spine surgery along with L5-S1 fusion done in July 2018 at [facility] by Dr. Altamese Dilling in [facility] by herself and presented to [facility] few days ago with 1 month duration of low back pain mostly on the left side radiating down to her left leg and some discharge from her postop back scar, at the hospital she was diagnosed with postop site infection and abscess with pus drainage, poorly controlled blood pressure, she was admitted to the hospital seen by ID, her case was discussed by neurosurgery at [facility] and then she was transferred here for further care Assessment & Plan:   Principal Problem:   Postoperative wound infection Active Problems:   HLD (hyperlipidemia)   Status post lumbar surgery   Obesity, Class III, BMI 40-49.9 (morbid obesity) ([place])   DM (diabetes mellitus), type 2 with renal complications (HCC)   Anemia   Acute renal failure superimposed on stage 3 chronic kidney disease ([place])   Essential hypertension   Infection and inflammatory reaction due to internal fixation device of spine, initial encounter ([place])   Post op Lumbar spine abscess: Possibly a sub acute infection. Initially sshe was started on broad spectrum IV antibiotics , later on transitioned to IV rocephin as per ID recommendations.  Neurosurgery and ID on board. She underwent I&D by neuro surgery on 06/06/2018 and cultures grew proteus sensitive to rocephin.   Hypertension: Well controlled.    Acute on CKD stage 4: - baseline creatinine is around 1.6.  - improved with hydration.  - creatinine is at 1.9 today.   Hypothyroidism: resume synthroid.    Anemia of chronic disease:    Transfuse to keep hemoglobin greater than 7.   Persistent leukocytosis:  Suspect from the abscess infection.    Type 2 DM WITH renal complications: CBG (last 3)  Recent Labs    06/07/18 0823 06/08/18 0744  GLUCAP 169* 136*   Resume SSI.         DVT prophylaxis: heparin.  Code Status:  Full code.  Family Communication: none at bedside.  Disposition Plan: possibly d/c home in 1 to 2 days.    Consultants:   Neuro surgery and ID.   Procedures: I&D on 11/3  Antimicrobials:  IV rocephin.    Subjective: Pain controlled. No chest pain or sob , no nausea, or vomiting.   Objective: Vitals:   06/08/18 1140 06/08/18 1625 06/08/18 1900 06/09/18 0556  BP: (!) 189/71  (!) 168/93 (!) 191/72  Pulse:      Resp:   17   Temp:  97.9 F (36.6 C) 97.7 F (36.5 C)   TempSrc:  Oral Oral   SpO2:   100%   Weight:        Intake/Output Summary (Last 24 hours) at 06/09/2018 0805 Last data filed at 06/09/2018 0600 Gross per 24 hour  Intake 600 ml  Output 1135 ml  Net -535 ml   Filed Weights   06/05/18 1300  Weight: 106 kg    Examination:  General exam: Appears calm and comfortable  Respiratory system: Clear to auscultation. Respiratory effort normal. Cardiovascular system: S1 & S2 heard, RRR. Gastrointestinal system: Abdomen is nondistended, soft and nontender. No organomegaly or masses felt. Normal bowel sounds heard.  Central nervous system: Alert and oriented. No focal neurological deficits. Extremities: Symmetric 5 x 5 power. Skin: No rashes, lesions or ulcers Psychiatry: Judgement and insight appear normal. Mood & affect appropriate.     Data Reviewed: I have personally reviewed following labs and imaging studies  CBC: Recent Labs  Lab 06/04/18 0245  06/05/18 1455 06/06/18 0314 06/07/18 0432 06/08/18 0457 06/09/18 0506  WBC 17.7*   < > 15.9* 15.0* 21.6* 18.4* 19.0*  NEUTROABS 13.4*  --   --   --   --   --   --   HGB 7.7*   < > 7.0* 8.1* 7.9* 8.4* 9.7*   HCT 24.8*   < > 22.3* 25.3* 25.7* 27.6* 31.4*  MCV 84.6   < > 83.5 81.4 82.9 83.9 84.0  PLT 456*   < > 383 352 386 362 392   < > = values in this interval not displayed.   Basic Metabolic Panel: Recent Labs  Lab 06/05/18 1455 06/06/18 0314 06/06/18 1129 06/07/18 0432 06/08/18 0457 06/09/18 0506  NA 136 136  --  135 134* 138  K 3.6 4.0  --  4.4 4.3 5.0  CL 105 107  --  105 105 104  CO2 25 23  --  25 22 26   GLUCOSE 112* 92  --  136* 122* 90  BUN 46* 45*  --  47* 58* 54*  CREATININE 2.19* 2.10*  --  1.94* 2.21* 1.99*  CALCIUM 8.3* 8.3*  --  8.7* 8.3* 8.8*  MG  --  1.8 1.8 1.9  --  1.9   GFR: Estimated Creatinine Clearance: 28 mL/min (A) (by C-G formula based on SCr of 1.99 mg/dL (H)). Liver Function Tests: Recent Labs  Lab 06/05/18 1455 06/06/18 0314 06/07/18 0432 06/08/18 0457 06/09/18 0506  AST 15 13* 13* 16 18  ALT 11 11 11 10 13   ALKPHOS 58 60 58 63 61  BILITOT 0.6 0.6 0.7 0.6 0.6  PROT 6.2* 6.0* 6.6 5.8* 6.7  ALBUMIN 1.6* 1.6* 1.8* 1.8* 2.0*   No results for input(s): LIPASE, AMYLASE in the last 168 hours. No results for input(s): AMMONIA in the last 168 hours. Coagulation Profile: Recent Labs  Lab 06/04/18 0636 06/05/18 1455  INR 1.38 1.45   Cardiac Enzymes: Recent Labs  Lab 06/04/18 0245 06/04/18 0636 06/04/18 1315 06/04/18 2316  TROPONINI 0.03* 0.03* 0.07* 0.03*   BNP (last 3 results) No results for input(s): PROBNP in the last 8760 hours. HbA1C: No results for input(s): HGBA1C in the last 72 hours. CBG: Recent Labs  Lab 06/05/18 0758 06/05/18 1200 06/06/18 0922 06/07/18 0823 06/08/18 0744  GLUCAP 92 105* 101* 169* 136*   Lipid Profile: No results for input(s): CHOL, HDL, LDLCALC, TRIG, CHOLHDL, LDLDIRECT in the last 72 hours. Thyroid Function Tests: No results for input(s): TSH, T4TOTAL, FREET4, T3FREE, THYROIDAB in the last 72 hours. Anemia Panel: No results for input(s): VITAMINB12, FOLATE, FERRITIN, TIBC, IRON, RETICCTPCT in the  last 72 hours. Sepsis Labs: No results for input(s): PROCALCITON, LATICACIDVEN in the last 168 hours.  Recent Results (from the past 240 hour(s))  Aerobic/Anaerobic Culture (surgical/deep wound)     Status: None (Preliminary result)   Collection Time: 06/04/18  6:11 AM  Result Value Ref Range Status   Specimen Description   Final    BACK Performed at [facility], [address] [place], [place] [address]    Special Requests   Final    Normal Performed at [facility],  [place], [place] [address]    Gram Stain   Final    RARE WBC PRESENT, PREDOMINANTLY PMN RARE GRAM POSITIVE RODS RARE GRAM POSITIVE COCCI Performed at [facility], [place] [address] [place], [place] [address]    Culture   Final    FEW PROTEUS MIRABILIS NO ANAEROBES ISOLATED; CULTURE IN PROGRESS FOR 5 DAYS    Report Status PENDING  Incomplete   Organism ID, Bacteria PROTEUS MIRABILIS  Final      Susceptibility   Proteus mirabilis - MIC*    AMPICILLIN <=2 SENSITIVE Sensitive     CEFAZOLIN <=4 SENSITIVE Sensitive     CEFEPIME <=1 SENSITIVE Sensitive     CEFTAZIDIME <=1 SENSITIVE Sensitive     CEFTRIAXONE <=1 SENSITIVE Sensitive     CIPROFLOXACIN <=0.25 SENSITIVE Sensitive     GENTAMICIN <=1 SENSITIVE Sensitive     IMIPENEM 8 INTERMEDIATE Intermediate     TRIMETH/SULFA <=20 SENSITIVE Sensitive     AMPICILLIN/SULBACTAM <=2 SENSITIVE Sensitive     PIP/TAZO <=4 SENSITIVE Sensitive     * FEW PROTEUS MIRABILIS  MRSA PCR Screening     Status: None   Collection Time: 06/04/18  6:13 AM  Result Value Ref Range Status   MRSA by PCR NEGATIVE NEGATIVE Final    Comment:        The GeneXpert MRSA Assay (FDA approved for NASAL specimens only), is one component of a comprehensive MRSA colonization surveillance program. It is not intended to diagnose MRSA infection nor to guide or monitor treatment for MRSA infections. Performed at [facility], [address]  [place], [place] [address]   Culture, blood (Routine X 2) w Reflex to ID Panel     Status: None   Collection Time: 06/04/18 11:15 PM  Result Value Ref Range Status   Specimen Description BLOOD RIGHT HAND  Final   Special Requests   Final    BOTTLES DRAWN AEROBIC AND ANAEROBIC Blood Culture adequate volume   Culture   Final    NO GROWTH 5 DAYS Performed at [facility], [address], [place], [place] [address]    Report Status 06/09/2018 FINAL  Final  Culture, blood (Routine X 2) w Reflex to ID Panel     Status: None   Collection Time: 06/04/18 11:17 PM  Result Value Ref Range Status   Specimen Description BLOOD LEFT ANTECUBITAL  Final   Special Requests   Final    BOTTLES DRAWN AEROBIC AND ANAEROBIC Blood Culture adequate volume   Culture   Final    NO GROWTH 5 DAYS Performed at [facility], [address] [place], [place] [address]    Report Status 06/09/2018 FINAL  Final  MRSA PCR Screening     Status: None   Collection Time: 06/05/18  4:37 PM  Result Value Ref Range Status   MRSA by PCR NEGATIVE NEGATIVE Final    Comment:        The GeneXpert MRSA Assay (FDA approved for NASAL specimens only), is one component of a comprehensive MRSA colonization surveillance program. It is not intended to diagnose MRSA infection nor to guide or monitor treatment for MRSA infections. Performed at [facility], [place] [address] [place],  [address]   Anaerobic culture     Status: None (Preliminary result)   Collection Time: 06/06/18  8:30 AM  Result Value Ref Range Status   Specimen Description WOUND LUMBAR  Final   Special Requests  Final    PATIENT ON FOLLOWING MEROPENEM Performed at [facility], [place] [address] [place], [place] [address]    Culture   Final    NO ANAEROBES ISOLATED; CULTURE IN PROGRESS FOR 5 DAYS   Report Status PENDING  Incomplete  Aerobic Culture (superficial specimen)     Status: None   Collection Time: 06/06/18  8:30 AM   Result Value Ref Range Status   Specimen Description WOUND LUMBAR  Final   Special Requests PATIENT ON FOLLOWING MEROPENEM  Final   Gram Stain   Final    RARE WBC PRESENT, PREDOMINANTLY MONONUCLEAR NO ORGANISMS SEEN Performed at [facility], [place] [address] [place], [place] [address]    Culture FEW PROTEUS MIRABILIS  Final   Report Status 06/09/2018 FINAL  Final   Organism ID, Bacteria PROTEUS MIRABILIS  Final      Susceptibility   Proteus mirabilis - MIC*    AMPICILLIN <=2 SENSITIVE Sensitive     CEFAZOLIN <=4 SENSITIVE Sensitive     CEFEPIME <=1 SENSITIVE Sensitive     CEFTAZIDIME <=1 SENSITIVE Sensitive     CEFTRIAXONE <=1 SENSITIVE Sensitive     CIPROFLOXACIN <=0.25 SENSITIVE Sensitive     GENTAMICIN <=1 SENSITIVE Sensitive     TRIMETH/SULFA <=20 SENSITIVE Sensitive     AMPICILLIN/SULBACTAM <=2 SENSITIVE Sensitive     PIP/TAZO <=4 SENSITIVE Sensitive     * FEW PROTEUS MIRABILIS         Radiology Studies: No results found.      Scheduled Meds: . sodium chloride   Intravenous Once  . amLODipine  10 mg Oral Daily  . carvedilol  25 mg Oral BID WC  . cholecalciferol  1,000 Units Oral Daily  . cloNIDine  0.1 mg Oral TID  . furosemide  40 mg Oral BID  . heparin  5,000 Units Subcutaneous Q8H  . hydrALAZINE  50 mg Oral Q8H  . isosorbide mononitrate  60 mg Oral Daily  . levothyroxine  112 mcg Oral Q0600  . sodium chloride flush  3 mL Intravenous Q12H  . vitamin C  500 mg Oral Daily   Continuous Infusions: . cefTRIAXone (ROCEPHIN)  IV       LOS: 4 days    Time spent: 32 minutes.     Hosie Poisson, MD [facility] Pager [phone]  If 7PM-7AM, please contact night-coverage [email or web address] Password [email or web address] 06/09/2018, 8:05 AM

## 2018-06-09 NOTE — Plan of Care (Signed)
Patient able to get OOB to chair and ambulate with minimal assist to Bathroom throughout the day.  Purulent drainage remains coming from Hemovac drain.  Will continue to monitor

## 2018-06-10 ENCOUNTER — Encounter (HOSPITAL_COMMUNITY): Payer: Self-pay | Admitting: General Practice

## 2018-06-10 ENCOUNTER — Other Ambulatory Visit: Payer: Self-pay

## 2018-06-10 MED ORDER — DOCUSATE SODIUM 100 MG PO CAPS
100.0000 mg | ORAL_CAPSULE | Freq: Every day | ORAL | Status: DC
  Administered 2018-06-10 – 2018-06-11 (×2): 100 mg via ORAL
  Filled 2018-06-10 (×2): qty 1

## 2018-06-10 MED ORDER — SENNA 8.6 MG PO TABS
1.0000 | ORAL_TABLET | Freq: Every day | ORAL | Status: DC
  Administered 2018-06-10 – 2018-06-11 (×2): 8.6 mg via ORAL
  Filled 2018-06-10 (×2): qty 1

## 2018-06-10 NOTE — Progress Notes (Signed)
  NEUROSURGERY PROGRESS NOTE   No issues overnight. Pt reports some back soreness, otherwise no complaints.  EXAM:  BP (!) 171/55 (BP Location: Left Arm)   Pulse 64   Temp 98 F (36.7 C) (Oral)   Resp 17   Wt 106 kg   SpO2 100%   BMI 42.74 kg/m   Awake, alert, oriented  Speech fluent, appropriate  CN grossly intact  MAE well Wound dry, no drainage, Hemovac in place  IMPRESSION:  77 y.o. female s/p lumbar wound debridement and closure for proteus mirabilis infection.  PLAN: - Cont Hemovac - Cont abx

## 2018-06-10 NOTE — Progress Notes (Signed)
Physical Therapy Treatment Patient Details Name: Taylor Tyler MRN: [id number] DOB: 1941/04/22 Today's Date: 06/10/2018    History of Present Illness 77 y.o. AA female with H/O Morbid Obesity, CKD 3, DM2, HTN, anemia of chronic disease, fatty liver, hypothyroidism, status post L spine surgery along with L5-S1 fusion done in July 2018 at [facility] by Dr. Cyndy Freeze. Presented to [facility] a few days ago was diagnosed with postop site infection and abscess with pus drainage, poorly controlled blood pressure, she was admitted to the hospital seen by ID, her case was discussed by neurosurgery at [facility] and then she was transferred here for further care. On 11/3 pt underwent lumbar wound exploration, debridement and closure of open wound.    PT Comments    Pt was able to progress ambulation down the hallway with RW min to min guard assist overall for most mobility.  She is preoccupied by wanting/needing to have a BM and tried multiple times during our session without success.  RN aware and giving her po meds to assist. PT will continue to follow acutely for safe mobility progression  Follow Up Recommendations  Home health PT;Supervision - Intermittent     Equipment Recommendations  None recommended by PT    Recommendations for Other Services   NA     Precautions / Restrictions Precautions Precautions: None Precaution Comments: followed back precautions for pain management but not necessary Required Braces or Orthoses: Other Brace/Splint Other Brace/Splint: drain    Mobility  Bed Mobility               General bed mobility comments: Pt was seated EOB when PT entered the room  Transfers Overall transfer level: Needs assistance Equipment used: Rolling walker (2 wheeled) Transfers: Sit to/from Stand Sit to Stand: Min assist         General transfer comment: Min assist to power up to standing from low bed and low  toilet  Ambulation/Gait Ambulation/Gait assistance: Min guard Gait Distance (Feet): 130 Feet Assistive device: Rolling walker (2 wheeled) Gait Pattern/deviations: Step-through pattern;Shuffle     General Gait Details: Pt ambulated to the bathroom, attempted to have a BM and then we went for a walk.  Min guard assist for safety during ambulation, progressing to supervision by the end of gait.            Balance Overall balance assessment: Needs assistance Sitting-balance support: Feet supported;No upper extremity supported Sitting balance-Leahy Scale: Good     Standing balance support: Bilateral upper extremity supported;No upper extremity supported Standing balance-Leahy Scale: Fair Standing balance comment: pt able to wash hands at sink without RW support                            Cognition Arousal/Alertness: Awake/alert Behavior During Therapy: WFL for tasks assessed/performed Overall Cognitive Status: Within Functional Limits for tasks assessed                                           General Comments General comments (skin integrity, edema, etc.): Pt given prune juice to attempt to help with her BM.        Pertinent Vitals/Pain Pain Assessment: Faces Faces Pain Scale: Hurts even more Pain Location: abdomen due to no BM yet Pain Descriptors / Indicators: Aching Pain Intervention(s): Limited activity within patient's  tolerance;Monitored during session;Repositioned           PT Goals (current goals can now be found in the care plan section) Acute Rehab PT Goals Patient Stated Goal: home Progress towards PT goals: Progressing toward goals    Frequency    Min 3X/week      PT Plan Current plan remains appropriate;Frequency needs to be updated       AM-PAC PT "6 Clicks" Daily Activity  Outcome Measure  Difficulty turning over in bed (including adjusting bedclothes, sheets and blankets)?: A Little Difficulty moving from  lying on back to sitting on the side of the bed? : A Little Difficulty sitting down on and standing up from a chair with arms (e.g., wheelchair, bedside commode, etc,.)?: Unable Help needed moving to and from a bed to chair (including a wheelchair)?: A Little Help needed walking in hospital room?: A Little Help needed climbing 3-5 steps with a railing? : A Little 6 Click Score: 16    End of Session   Activity Tolerance: Patient tolerated treatment well Patient left: Other (comment)(in bathroom to try to have a BM) Nurse Communication: Mobility status PT Visit Diagnosis: Other abnormalities of gait and mobility (R26.89)     Time: 0488-8916 PT Time Calculation (min) (ACUTE ONLY): 32 min  Charges:  $Gait Training: 8-22 mins $Therapeutic Activity: 8-22 mins          Cailean Heacock B. Jasun Gasparini, PT, DPT  Acute Rehabilitation [phone] pager #[phone] office            06/10/2018, 3:36 PM

## 2018-06-10 NOTE — Progress Notes (Signed)
PROGRESS NOTE    Taylor Tyler  QHU:[id number] DOB: 08-17-1940 DOA: 06/05/2018 PCP: McLean-Scocuzza, Nino Glow, MD   Brief Narrative:  elleni mozing[age] y.o.AAfemalewith H/O Morbid Obesity, CKD 3, DM2, HTN,anemia of chronic disease, fatty liver, hypothyroidism, status post L spine surgery along with L5-S1 fusion done in July 2018 at [facility] by Dr. Altamese Dilling in [facility] by herself and presented to [facility] few days ago with 1 month duration of low back pain mostly on the left side radiating down to her left leg and some discharge from her postop back scar, at the hospital she was diagnosed with postop site infection and abscess with pus drainage, poorly controlled blood pressure, she was admitted to the hospital seen by ID, her case was discussed by neurosurgery at [facility] and then she was transferred here for further care Assessment & Plan:   Principal Problem:   Postoperative wound infection Active Problems:   HLD (hyperlipidemia)   Status post lumbar surgery   Obesity, Class III, BMI 40-49.9 (morbid obesity) ([place])   DM (diabetes mellitus), type 2 with renal complications (H[facility])   Anemia   Acute renal failure superimposed on stage 3 chronic kidney disease ([place])   Essential hypertension   Infection and inflammatory reaction due to internal fixation device of spine, initial encounter ([place])   Post op Lumbar spine abscess: Possibly a sub acute infection. Initially she was started on broad spectrum IV antibiotics , later on transitioned to IV rocephin as per ID recommendations.  Neurosurgery and ID on board. She underwent I&D by neuro surgery on 06/06/2018, hemovac placed and cultures grew proteus sensitive to rocephin. She continues to be afebrile and but with leukocytosis. She does not appear to be toxic at this time.  ID recommendations to continue with 2 weeks of amoxicillin on discharge through 11/20. Recheck labs tomorrow.    Hypertension: Well controlled.    Acute on CKD stage 4: - baseline creatinine is around 1.6.  - improved with hydration.  - creatinine is at 1.9 today.  Recheck creatinine tomorrow.   Hypothyroidism: resume synthroid.    Anemia of chronic disease:  Transfuse to keep hemoglobin greater than 7. hemogobin around 9 today.  Recheck cbc in am.   Persistent leukocytosis:  Suspect from the abscess infection.    Type 2 DM WITH renal complications: CBG (last 3)  Recent Labs    06/08/18 0744  GLUCAP 136*   Resume SSI.         DVT prophylaxis: heparin.  Code Status:  Full code.  Family Communication: none at bedside.  Disposition Plan: possibly d/c home in 1 to 2 days.    Consultants:   Neuro surgery and ID.   Procedures: I&D on 11/3  Antimicrobials:  IV rocephin.    Subjective: Pain controlled. No chest pain or sob , no nausea, or vomiting.   Objective: Vitals:   06/10/18 0400 06/10/18 0620 06/10/18 0747 06/10/18 1116  BP: (!) 108/92 (!) 169/62 (!) 165/65 (!) 171/55  Pulse:    64  Resp:      Temp: 97.9 F (36.6 C)  98.7 F (37.1 C) 98 F (36.7 C)  TempSrc: Oral  Axillary Oral  SpO2:   100%   Weight:        Intake/Output Summary (Last 24 hours) at 06/10/2018 1538 Last data filed at 06/10/2018 0600 Gross per 24 hour  Intake 480 ml  Output 1080 ml  Net -600 ml   [employer]  06/05/18 1300  Weight: 106 kg    Examination:  General exam: Appears calm and comfortable not in any kind of distress.  Respiratory system: Clear to auscultation. Respiratory effort normal. No wheezing or rhonchi.  Cardiovascular system: S1 & S2 heard, RRR. No JVD.  Gastrointestinal system: Abdomen is nondistended, soft and nontender.Normal bowel sounds heard. Central nervous system: Alert and oriented. Grossly non focal.  Extremities: Symmetric 5 x 5 power. No cyanosis or clubbing.  Skin: No rashes, lesions or ulcers Psychiatry:Mood & affect appropriate.      Data Reviewed: I have personally reviewed following labs and imaging studies  CBC: Recent Labs  Lab 06/04/18 0245  06/05/18 1455 06/06/18 0314 06/07/18 0432 06/08/18 0457 06/09/18 0506  WBC 17.7*   < > 15.9* 15.0* 21.6* 18.4* 19.0*  NEUTROABS 13.4*  --   --   --   --   --   --   HGB 7.7*   < > 7.0* 8.1* 7.9* 8.4* 9.7*  HCT 24.8*   < > 22.3* 25.3* 25.7* 27.6* 31.4*  MCV 84.6   < > 83.5 81.4 82.9 83.9 84.0  PLT 456*   < > 383 352 386 362 392   < > = values in this interval not displayed.   Basic Metabolic Panel: Recent Labs  Lab 06/05/18 1455 06/06/18 0314 06/06/18 1129 06/07/18 0432 06/08/18 0457 06/09/18 0506  NA 136 136  --  135 134* 138  K 3.6 4.0  --  4.4 4.3 5.0  CL 105 107  --  105 105 104  CO2 25 23  --  25 22 26   GLUCOSE 112* 92  --  136* 122* 90  BUN 46* 45*  --  47* 58* 54*  CREATININE 2.19* 2.10*  --  1.94* 2.21* 1.99*  CALCIUM 8.3* 8.3*  --  8.7* 8.3* 8.8*  MG  --  1.8 1.8 1.9  --  1.9   GFR: Estimated Creatinine Clearance: 28 mL/min (A) (by C-G formula based on SCr of 1.99 mg/dL (H)). Liver Function Tests: Recent Labs  Lab 06/05/18 1455 06/06/18 0314 06/07/18 0432 06/08/18 0457 06/09/18 0506  AST 15 13* 13* 16 18  ALT 11 11 11 10 13   ALKPHOS 58 60 58 63 61  BILITOT 0.6 0.6 0.7 0.6 0.6  PROT 6.2* 6.0* 6.6 5.8* 6.7  ALBUMIN 1.6* 1.6* 1.8* 1.8* 2.0*   No results for input(s): LIPASE, AMYLASE in the last 168 hours. No results for input(s): AMMONIA in the last 168 hours. Coagulation Profile: Recent Labs  Lab 06/04/18 0636 06/05/18 1455  INR 1.38 1.45   Cardiac Enzymes: Recent Labs  Lab 06/04/18 0245 06/04/18 0636 06/04/18 1315 06/04/18 2316  TROPONINI 0.03* 0.03* 0.07* 0.03*   BNP (last 3 results) No results for input(s): PROBNP in the last 8760 hours. HbA1C: No results for input(s): HGBA1C in the last 72 hours. CBG: Recent Labs  Lab 06/05/18 0758 06/05/18 1200 06/06/18 0922 06/07/18 0823 06/08/18 0744  GLUCAP 92 105*  101* 169* 136*   Lipid Profile: No results for input(s): CHOL, HDL, LDLCALC, TRIG, CHOLHDL, LDLDIRECT in the last 72 hours. Thyroid Function Tests: No results for input(s): TSH, T4TOTAL, FREET4, T3FREE, THYROIDAB in the last 72 hours. Anemia Panel: No results for input(s): VITAMINB12, FOLATE, FERRITIN, TIBC, IRON, RETICCTPCT in the last 72 hours. Sepsis Labs: No results for input(s): PROCALCITON, LATICACIDVEN in the last 168 hours.  Recent Results (from the past 240 hour(s))  Aerobic/Anaerobic Culture (surgical/deep wound)     Status:  None   Collection Time: 06/04/18  6:11 AM  Result Value Ref Range Status   Specimen Description   Final    BACK Performed at [facility], [address], [place], [place] [address]    Special Requests   Final    Normal Performed at [facility], [address] [place], [place] [address]    Gram Stain   Final    RARE WBC PRESENT, PREDOMINANTLY PMN RARE GRAM POSITIVE RODS RARE GRAM POSITIVE COCCI    Culture   Final    FEW PROTEUS MIRABILIS NO ANAEROBES ISOLATED Performed at [facility], [place] [address], [place], [place] [address]    Report Status 06/09/2018 FINAL  Final   Organism ID, Bacteria PROTEUS MIRABILIS  Final      Susceptibility   Proteus mirabilis - MIC*    AMPICILLIN <=2 SENSITIVE Sensitive     CEFAZOLIN <=4 SENSITIVE Sensitive     CEFEPIME <=1 SENSITIVE Sensitive     CEFTAZIDIME <=1 SENSITIVE Sensitive     CEFTRIAXONE <=1 SENSITIVE Sensitive     CIPROFLOXACIN <=0.25 SENSITIVE Sensitive     GENTAMICIN <=1 SENSITIVE Sensitive     IMIPENEM 8 INTERMEDIATE Intermediate     TRIMETH/SULFA <=20 SENSITIVE Sensitive     AMPICILLIN/SULBACTAM <=2 SENSITIVE Sensitive     PIP/TAZO <=4 SENSITIVE Sensitive     * FEW PROTEUS MIRABILIS  MRSA PCR Screening     Status: None   Collection Time: 06/04/18  6:13 AM  Result Value Ref Range Status   MRSA by PCR NEGATIVE NEGATIVE Final    Comment:        The GeneXpert MRSA  Assay (FDA approved for NASAL specimens only), is one component of a comprehensive MRSA colonization surveillance program. It is not intended to diagnose MRSA infection nor to guide or monitor treatment for MRSA infections. Performed at [facility], [address] [place], [place] [id number]   Culture, blood (Routine X 2) w Reflex to ID Panel     Status: None   Collection Time: 06/04/18 11:15 PM  Result Value Ref Range Status   Specimen Description BLOOD RIGHT HAND  Final   Special Requests   Final    BOTTLES DRAWN AEROBIC AND ANAEROBIC Blood Culture adequate volume   Culture   Final    NO GROWTH 5 DAYS Performed at [facility], [address] [place], [place] [address]    Report Status 06/09/2018 FINAL  Final  Culture, blood (Routine X 2) w Reflex to ID Panel     Status: None   Collection Time: 06/04/18 11:17 PM  Result Value Ref Range Status   Specimen Description BLOOD LEFT ANTECUBITAL  Final   Special Requests   Final    BOTTLES DRAWN AEROBIC AND ANAEROBIC Blood Culture adequate volume   Culture   Final    NO GROWTH 5 DAYS Performed at [facility], [address] [place], [place] [address]    Report Status 06/09/2018 FINAL  Final  MRSA PCR Screening     Status: None   Collection Time: 06/05/18  4:37 PM  Result Value Ref Range Status   MRSA by PCR NEGATIVE NEGATIVE Final    Comment:        The GeneXpert MRSA Assay (FDA approved for NASAL specimens only), is one component of a comprehensive MRSA colonization surveillance program. It is not intended to diagnose MRSA infection nor to guide or monitor treatment for MRSA infections. Performed at  [facility], [place] [address] [place], [place] [address]   Anaerobic culture     Status: None (Preliminary result)   Collection Time: 06/06/18  8:30 AM  Result Value Ref Range Status   Specimen Description WOUND LUMBAR  Final   Special Requests   Final    PATIENT ON FOLLOWING  MEROPENEM Performed at [facility], [place] [address] [name], [place] [address]    Culture   Final    NO ANAEROBES ISOLATED; CULTURE IN PROGRESS FOR 5 DAYS   Report Status PENDING  Incomplete  Aerobic Culture (superficial specimen)     Status: None   Collection Time: 06/06/18  8:30 AM  Result Value Ref Range Status   Specimen Description WOUND LUMBAR  Final   Special Requests PATIENT ON FOLLOWING MEROPENEM  Final   Gram Stain   Final    RARE WBC PRESENT, PREDOMINANTLY MONONUCLEAR NO ORGANISMS SEEN Performed at [facility], [place] [address], [place], [place] [address]    Culture FEW PROTEUS MIRABILIS  Final   Report Status 06/09/2018 FINAL  Final   Organism ID, Bacteria PROTEUS MIRABILIS  Final      Susceptibility   Proteus mirabilis - MIC*    AMPICILLIN <=2 SENSITIVE Sensitive     CEFAZOLIN <=4 SENSITIVE Sensitive     CEFEPIME <=1 SENSITIVE Sensitive     CEFTAZIDIME <=1 SENSITIVE Sensitive     CEFTRIAXONE <=1 SENSITIVE Sensitive     CIPROFLOXACIN <=0.25 SENSITIVE Sensitive     GENTAMICIN <=1 SENSITIVE Sensitive     TRIMETH/SULFA <=20 SENSITIVE Sensitive     AMPICILLIN/SULBACTAM <=2 SENSITIVE Sensitive     PIP/TAZO <=4 SENSITIVE Sensitive     * FEW PROTEUS MIRABILIS         Radiology Studies: No results found.      Scheduled Meds: . sodium chloride   Intravenous Once  . amLODipine  10 mg Oral Daily  . carvedilol  25 mg Oral BID WC  . cholecalciferol  1,000 Units Oral Daily  . cloNIDine  0.1 mg Oral TID  . docusate sodium  100 mg Oral Daily  . furosemide  40 mg Oral BID  . heparin  5,000 Units Subcutaneous Q8H  . hydrALAZINE  50 mg Oral Q8H  . isosorbide mononitrate  60 mg Oral Daily  . levothyroxine  112 mcg Oral Q0600  . senna  1 tablet Oral Daily  . sodium chloride flush  3 mL Intravenous Q12H  . vitamin C  500 mg Oral Daily   Continuous Infusions: . cefTRIAXone (ROCEPHIN)  IV 2 g (06/10/18 0752)     LOS: 5 days    Time spent: 28  minutes.     Hosie Poisson, MD [facility] Pager [phone]  If 7PM-7AM, please contact night-coverage [email or web address] Password [email or web address] 06/10/2018, 3:38 PM

## 2018-06-11 LAB — CBC
HEMATOCRIT: 29.1 % — AB (ref 36.0–46.0)
Hemoglobin: 8.7 g/dL — ABNORMAL LOW (ref 12.0–15.0)
MCH: 25.3 pg — ABNORMAL LOW (ref 26.0–34.0)
MCHC: 29.9 g/dL — ABNORMAL LOW (ref 30.0–36.0)
MCV: 84.6 fL (ref 80.0–100.0)
Platelets: 397 10*3/uL (ref 150–400)
RBC: 3.44 MIL/uL — AB (ref 3.87–5.11)
RDW: 16.4 % — AB (ref 11.5–15.5)
WBC: 13.3 10*3/uL — AB (ref 4.0–10.5)
nRBC: 0 % (ref 0.0–0.2)

## 2018-06-11 LAB — BASIC METABOLIC PANEL
ANION GAP: 6 (ref 5–15)
BUN: 48 mg/dL — ABNORMAL HIGH (ref 8–23)
CALCIUM: 8.7 mg/dL — AB (ref 8.9–10.3)
CHLORIDE: 105 mmol/L (ref 98–111)
CO2: 25 mmol/L (ref 22–32)
Creatinine, Ser: 2.04 mg/dL — ABNORMAL HIGH (ref 0.44–1.00)
GFR calc non Af Amer: 23 mL/min — ABNORMAL LOW (ref >60)
GFR, EST AFRICAN AMERICAN: 26 mL/min — AB (ref >60)
Glucose, Bld: 113 mg/dL — ABNORMAL HIGH (ref 70–99)
Potassium: 4.4 mmol/L (ref 3.5–5.1)
Sodium: 136 mmol/L (ref 135–145)

## 2018-06-11 LAB — ANAEROBIC CULTURE

## 2018-06-11 MED ORDER — DOCUSATE SODIUM 100 MG PO CAPS: 100.0000 mg | ORAL_CAPSULE | Freq: Every day | ORAL | 0 refills | Status: DC

## 2018-06-11 MED ORDER — SENNA 8.6 MG PO TABS: 1.0000 | ORAL_TABLET | Freq: Every day | ORAL | 0 refills | Status: DC

## 2018-06-11 MED ORDER — CARVEDILOL 25 MG PO TABS: 25.0000 mg | ORAL_TABLET | Freq: Two times a day (BID) | ORAL | 0 refills | Status: DC

## 2018-06-11 MED ORDER — GABAPENTIN 300 MG PO CAPS
300.0000 mg | ORAL_CAPSULE | Freq: Every evening | ORAL | Status: DC | PRN
  Administered 2018-06-11: 300 mg via ORAL
  Filled 2018-06-11: qty 1

## 2018-06-11 MED ORDER — HYDRALAZINE HCL 50 MG PO TABS: 50.0000 mg | ORAL_TABLET | Freq: Three times a day (TID) | ORAL | 0 refills | Status: DC

## 2018-06-11 NOTE — Discharge Summary (Addendum)
Physician Discharge Summary  Taylor Tyler WJX:[id number] DOB: 31-Dec-1940 DOA: 06/05/2018  PCP: McLean-Scocuzza, Nino Glow, MD  Admit date: 06/05/2018 Discharge date: 06/12/2018  Admitted From: Home.  Disposition:  Home.   Recommendations for Outpatient Follow-up:  1. Follow up with PCP in 1-2 weeks 2. Please obtain BMP/CBC in one week 3. Please follow up with neuro surgery as recommended.   Home Health:yes   Discharge Condition:stable.  CODE STATUS:full code.  Diet recommendation: Heart Healthy   Brief/Interim Summary:  [name] a77 y.o.AAfemalewith H/O Morbid Obesity, CKD 3, DM2, HTN,anemia of chronic disease, fatty liver, hypothyroidism, status post L spine surgery along with L5-S1 fusion done in July 2018 at [facility] by Dr. Altamese Dilling in [facility] by herself and presented to [facility] few days ago with 1 month duration of low back pain mostly on the left side radiating down to her left leg and some discharge from her postop back scar, at the hospital she was diagnosed with postop site infection and abscess with pus drainage, poorly controlled blood pressure, she was admitted to the hospital seen by ID, her case was discussed by neurosurgery at [facility] and then she was transferred here for further care.  Discharge Diagnoses:  Principal Problem:   Postoperative wound infection Active Problems:   HLD (hyperlipidemia)   Status post lumbar surgery   Obesity, Class III, BMI 40-49.9 (morbid obesity) (HCC)   DM (diabetes mellitus), type 2 with renal complications (HCC)   Anemia   Acute renal failure superimposed on stage 3 chronic kidney disease (HCC)   Essential hypertension   Infection and inflammatory reaction due to internal fixation device of spine, initial encounter ([place])  Post op Lumbar spine abscess: Possibly a sub acute infection. Initially she was started on broad spectrum IV antibiotics , later on transitioned to IV  rocephin as per ID recommendations.  Neurosurgery and ID on board. She underwent I&D by neuro surgery on 06/06/2018, hemovac placed and cultures grew proteus sensitive to rocephin. She continues to be afebrile and but with leukocytosis. She does not appear to be toxic at this time.  ID recommendations to continue with 2 weeks of amoxicillin on discharge through 11/20. Recheck labs tomorrow.   Hypertension: Well controlled.    Acute on CKD stage 4: - baseline creatinine is around 1.6.  - improved with hydration.  - creatinine is at 1.9 today.  Recheck creatinine tomorrow.   Hypothyroidism: resume synthroid.    Anemia of chronic disease:  Transfuse to keep hemoglobin greater than 7. hemogobin around 9 today.  Recheck cbc in am.   Persistent leukocytosis:  Suspect from the abscess infection. Recheck cbc in one week.    Type 2 DM WITH renal complications: CBG (last 3)  RecentLabs(last2labs)     Recent Labs    06/08/18 0744  GLUCAP 136*     Resume SSI.        Discharge Instructions  Discharge Instructions    Diet - low sodium heart healthy   Complete by:  As directed    Discharge instructions   Complete by:  As directed    PLEASE follow up with neuro surgery as recommended.  Home health RN ordered for hemo vac care.  Please follow up with PCP in one week and get lab work done to check cbc and bmp in one week.     Allergies as of 06/12/2018      Reactions   Clams [shellfish Allergy] Swelling, Other (See Comments)  THROAT SWELLS NECK TURNS RED   Hydralazine Other (See Comments)   CHEST TIGHTNESS Patient has tolerated multiple doses of hydralazine since allergy was listed    Norvasc [amlodipine Besylate] Swelling   Leg edema    Milk-related Compounds Diarrhea      Medication List    STOP taking these medications   labetalol 100 MG tablet Commonly known as:  NORMODYNE   losartan 100 MG tablet Commonly known as:  COZAAR     TAKE  these medications   acetaminophen 500 MG tablet Commonly known as:  TYLENOL Take 1,000 mg by mouth 3 (three) times daily as needed for mild pain.   amLODipine 10 MG tablet Commonly known as:  NORVASC Take 1 tablet (10 mg total) by mouth daily.   amoxicillin 500 MG capsule Commonly known as:  AMOXIL Take 1 capsule (500 mg total) by mouth 3 (three) times daily for 11 days.   carvedilol 25 MG tablet Commonly known as:  COREG Take 1 tablet (25 mg total) by mouth 2 (two) times daily with a meal.   cholecalciferol 1000 units tablet Commonly known as:  VITAMIN D Take 1,000 Units by mouth daily.   cloNIDine 0.1 MG tablet Commonly known as:  CATAPRES Take 0.1 mg by mouth 2 (two) times daily.   diclofenac sodium 1 % Gel Commonly known as:  VOLTAREN Apply 4 g topically 4 (four) times daily as needed (knees).   docusate sodium 100 MG capsule Commonly known as:  COLACE Take 1 capsule (100 mg total) by mouth daily.   ferrous sulfate 325 (65 FE) MG tablet Take 325 mg by mouth daily with breakfast.   furosemide 40 MG tablet Commonly known as:  LASIX Take 40 mg by mouth 2 (two) times daily.   gabapentin 300 MG capsule Commonly known as:  NEURONTIN Take 300 mg by mouth at bedtime as needed (pain).   hydrALAZINE 50 MG tablet Commonly known as:  APRESOLINE Take 1 tablet (50 mg total) by mouth every 8 (eight) hours.   isosorbide mononitrate 30 MG 24 hr tablet Commonly known as:  IMDUR Take 1 tablet (30 mg total) by mouth daily.   levothyroxine 112 MCG tablet Commonly known as:  SYNTHROID, LEVOTHROID Take 112 mcg by mouth daily before breakfast.   magnesium oxide 400 MG tablet Commonly known as:  MAG-OX Take 400 mg by mouth daily.   senna 8.6 MG Tabs tablet Commonly known as:  SENOKOT Take 1 tablet (8.6 mg total) by mouth daily.   vitamin C 500 MG tablet Commonly known as:  ASCORBIC ACID Take 500 mg by mouth daily.      Follow-up Information    McLean-Scocuzza, Nino Glow, MD. Schedule an appointment as soon as possible for a visit in 1 week(s).   Specialty:  Internal Medicine Contact information: [address] [place] [address] [phone]        Consuella Lose, MD. Schedule an appointment as soon as possible for a visit in 1 week(s).   Specialty:  Neurosurgery Contact information: [address] [place] [address] [phone]          Allergies  Allergen Reactions  . Clams [Shellfish Allergy] Swelling and Other (See Comments)    THROAT SWELLS NECK TURNS RED  . Hydralazine Other (See Comments)    CHEST TIGHTNESS Patient has tolerated multiple doses of hydralazine since allergy was listed   . Norvasc [Amlodipine Besylate] Swelling    Leg edema   . Milk-Related  Compounds Diarrhea    Consultations:  Neuro surgery.    Procedures/Studies: Dg Chest 2 View  Result Date: 06/04/2018 CLINICAL DATA:  77 year old female with shortness of breath. EXAM: CHEST - 2 VIEW COMPARISON:  Chest radiograph dated 03/29/2017 FINDINGS: There is moderate cardiomegaly. Minimal vascular congestion. No edema. No focal consolidation, pleural effusion, or pneumothorax. No acute osseous pathology. IMPRESSION: Moderate cardiomegaly with minimal vascular congestion. No edema or focal consolidation. Electronically Signed   By: Anner Crete M.D.   On: 06/04/2018 04:01   Dg Lumbar Spine Complete  Result Date: 06/04/2018 CLINICAL DATA:  77 year old female with chronic lower back pain. EXAM: LUMBAR SPINE - COMPLETE 4+ VIEW COMPARISON:  Lumbar spine MRI dated 03/30/2018 FINDINGS: There is no acute fracture or subluxation of the lumbar spine. There is osteopenia with multilevel degenerative changes. L3-S1 disc spacers and posterior fixation rod and screws as well as L5-S1 anterior fusion noted. Overall these findings are similar to the radiograph of 03/09/2018. Apparent large pocket of air along the posterior lower lumbar spine at L4-S1 and  along the fixation hardware likely represents superimposed bowel. Clinical correlation is recommended. There is atherosclerotic calcification of the aorta. IMPRESSION: 1. No acute fracture or subluxation of the lumbar spine. Stable postoperative changes. 2. A pocket of air over the hardware, likely superimposed bowel. Electronically Signed   By: Anner Crete M.D.   On: 06/04/2018 04:09   Mr Lumbar Spine Wo Contrast  Result Date: 06/04/2018 CLINICAL DATA:  Fluid leaking from spinal wound incision, history of multiple lumbar spine surgeries. Status post lumbar spine wound debridement March 30, 2017. EXAM: MRI LUMBAR SPINE WITHOUT CONTRAST TECHNIQUE: Multiplanar, multisequence MR imaging of the lumbar spine was performed. No intravenous contrast was administered. COMPARISON:  Lumbar spine radiographs June 04, 2018 and MRI lumbar spine March 30, 2018 FINDINGS: Moderately motion degraded examination. SEGMENTATION: For the purposes of this report, the last well-formed intervertebral disc is reported as L5-S1. ALIGNMENT: Maintained lumbar lordosis. Minimal grade 1 L2-3 retrolisthesis. No spondylolysis. VERTEBRAE:Status post L3 through S1 PLIF, susceptibility artifact from hardware. Mild L1-2 disc height loss with mild disc desiccation. No suspicious or acute bone marrow signal the limited by hardware artifact and patient motion. CONUS MEDULLARIS AND CAUDA EQUINA: Conus medullaris terminates at L1-2 and demonstrates normal morphology and signal characteristics. Limited assessment of cauda equina due to hardware artifact common no nerve root clumping. PARASPINAL AND OTHER SOFT TISSUES: Multiple air-fluid levels within the paraspinal muscles and subcutaneous fat along surgical approach and within surgical bed. Postoperative paraspinal muscle denervation with superimposed RIGHT interstitial STIR signal. DISC LEVELS (limited assessment due to hardware artifact and motion): L1-2: Small broad-based disc bulge, mild  facet arthropathy and ligamentum flavum redundancy. No canal stenosis. Mild bilateral neural foraminal narrowing. L2-3: Retrolisthesis. Moderate broad-based disc bulge. Moderate canal stenosis. Mild neural foraminal narrowing may be overestimated by hardware artifact. L3-4: PLIF, posterior decompression. No canal stenosis or definite neural foraminal narrowing. L4-5: PLIF and posterior decompression. No canal stenosis. Moderate possible LEFT neural foraminal narrowing, potentially overestimated by hardware artifact. L5-S1: PLIF and posterior decompression. No canal stenosis. Small suspected central disc extrusion, stable from prior examination. No definite neural foraminal narrowing. IMPRESSION: 1. Air-fluid levels within paraspinal muscles and subcutaneous fat along surgical approach and within surgical bed, query recent intervention, alternatively this could reflect infection. Additional paraspinal muscle denervation versus myositis. 2. Status post L3 through S1 PLIF and posterior decompression. Minimal grade 1 L2-3 retrolisthesis. 3. Moderate canal stenosis L2-3. Multilevel suspected neural foraminal narrowing,  potentially moderate on the LEFT at L4-5. 4. These results will be called to the ordering clinician or representative by the Radiologist Assistant, and communication documented in the PACS or zVision Dashboard. Electronically Signed   By: Elon Alas M.D.   On: 06/04/2018 23:14   US Renal  Result Date: 06/04/2018 CLINICAL DATA:  Acute renal failure EXAM: RENAL / URINARY TRACT ULTRASOUND COMPLETE COMPARISON:  CT 03/29/2017 FINDINGS: Right Kidney: Renal measurements: 9.6 x 5.7 x 5.2 cm = volume: 142 mL. Diffusely increased echotexture. No mass or hydronephrosis. Left Kidney: Renal measurements: 9.1 x 4.4 x 3.4 cm = volume: 71 mL. Diffusely increased echotexture. No mass or hydronephrosis. Bladder: Not visualized, decompressed with Foley catheter in place. IMPRESSION: Atrophic, echogenic kidneys  compatible with chronic medical renal disease. No hydronephrosis. Electronically Signed   By: Rolm Baptise M.D.   On: 06/04/2018 09:48   Dg Chest Port 1 View  Result Date: 06/07/2018 CLINICAL DATA:  Shortness of breath. EXAM: PORTABLE CHEST 1 VIEW COMPARISON:  06/05/2018, 07/06/2016 and chest CT 03/29/2017 FINDINGS: Lungs are adequately inflated without focal airspace consolidation or effusion. Minimal prominence of the perihilar markings. Pleural based mass over the lateral right lower thorax which may be slightly larger, although previous CT suggests this represents a lipoma. Moderate stable cardiomegaly. Remainder of the exam is unchanged. IMPRESSION: Moderate stable cardiomegaly with suggestion mild vascular congestion. Pleural base mass over the lateral right lower thorax which may be slightly larger, although shown to represent a lipoma by previous CT. Electronically Signed   By: Marin Olp M.D.   On: 06/07/2018 07:23   Dg Chest Port 1 View  Result Date: 06/05/2018 CLINICAL DATA:  SOB. Best obtainable image EXAM: PORTABLE CHEST 1 VIEW COMPARISON:  Radiograph 06/04/2018 FINDINGS: Stable enlarged cardiac silhouette. Central venous pulmonary congestion. No effusion, infiltrate pneumothorax. IMPRESSION: Cardiomegaly and central venous congestion.  No interval change. Electronically Signed   By: Suzy Bouchard M.D.   On: 06/05/2018 14:54     Subjective:  No chest pain or sob, no back pain or fevers.  Discharge Exam: Vitals:   06/12/18 0300 06/12/18 0801  BP: (!) 142/50   Pulse: 60 60  Resp:    Temp: 97.8 F (36.6 C)   SpO2: 100%    Vitals:   06/11/18 1900 06/11/18 2300 06/12/18 0300 06/12/18 0801  BP: (!) 163/54 (!) 145/53 (!) 142/50   Pulse: 62 (!) 57 60 60  Resp:      Temp: 98 F (36.7 C) 97.8 F (36.6 C) 97.8 F (36.6 C)   TempSrc: Oral Oral Oral   SpO2: 100% 100% 100%   Weight:        General: Pt is alert, awake, not in acute distress Cardiovascular: RRR, S1/S2 +, no  rubs, no gallops Respiratory: CTA bilaterally, no wheezing, no rhonchi Abdominal: Soft, NT, ND, bowel sounds + Extremities: no edema, no cyanosis    The results of significant diagnostics from this hospitalization (including imaging, microbiology, ancillary and laboratory) are listed below for reference.     Microbiology: Recent Results (from the past 240 hour(s))  Aerobic/Anaerobic Culture (surgical/deep wound)     Status: None   Collection Time: 06/04/18  6:11 AM  Result Value Ref Range Status   Specimen Description   Final    BACK Performed at [facility], [address] [place], [place] [address]    Special Requests   Final    Normal Performed at [facility], [place],  [place] [address]    Gram Stain   Final    RARE WBC PRESENT, PREDOMINANTLY PMN RARE GRAM POSITIVE RODS RARE GRAM POSITIVE COCCI    Culture   Final    FEW PROTEUS MIRABILIS NO ANAEROBES ISOLATED Performed at [facility], [address], [place], [place] [address]    Report Status 06/09/2018 FINAL  Final   Organism ID, Bacteria PROTEUS MIRABILIS  Final      Susceptibility   Proteus mirabilis - MIC*    AMPICILLIN <=2 SENSITIVE Sensitive     CEFAZOLIN <=4 SENSITIVE Sensitive     CEFEPIME <=1 SENSITIVE Sensitive     CEFTAZIDIME <=1 SENSITIVE Sensitive     CEFTRIAXONE <=1 SENSITIVE Sensitive     CIPROFLOXACIN <=0.25 SENSITIVE Sensitive     GENTAMICIN <=1 SENSITIVE Sensitive     IMIPENEM 8 INTERMEDIATE Intermediate     TRIMETH/SULFA <=20 SENSITIVE Sensitive     AMPICILLIN/SULBACTAM <=2 SENSITIVE Sensitive     PIP/TAZO <=4 SENSITIVE Sensitive     * FEW PROTEUS MIRABILIS  MRSA PCR Screening     Status: None   Collection Time: 06/04/18  6:13 AM  Result Value Ref Range Status   MRSA by PCR NEGATIVE NEGATIVE Final    Comment:        The GeneXpert MRSA Assay (FDA approved for NASAL specimens only), is one component of a comprehensive MRSA  colonization surveillance program. It is not intended to diagnose MRSA infection nor to guide or monitor treatment for MRSA infections. Performed at [facility], [address] [place], [place] [address]   Culture, blood (Routine X 2) w Reflex to ID Panel     Status: None   Collection Time: 06/04/18 11:15 PM  Result Value Ref Range Status   Specimen Description BLOOD RIGHT HAND  Final   Special Requests   Final    BOTTLES DRAWN AEROBIC AND ANAEROBIC Blood Culture adequate volume   Culture   Final    NO GROWTH 5 DAYS Performed at [facility], [address] [place], [place] [address]    Report Status 06/09/2018 FINAL  Final  Culture, blood (Routine X 2) w Reflex to ID Panel     Status: None   Collection Time: 06/04/18 11:17 PM  Result Value Ref Range Status   Specimen Description BLOOD LEFT ANTECUBITAL  Final   Special Requests   Final    BOTTLES DRAWN AEROBIC AND ANAEROBIC Blood Culture adequate volume   Culture   Final    NO GROWTH 5 DAYS Performed at [facility], [address] [place], [place] [address]    Report Status 06/09/2018 FINAL  Final  MRSA PCR Screening     Status: None   Collection Time: 06/05/18  4:37 PM  Result Value Ref Range Status   MRSA by PCR NEGATIVE NEGATIVE Final    Comment:        The GeneXpert MRSA Assay (FDA approved for NASAL specimens only), is one component of a comprehensive MRSA colonization surveillance program. It is not intended to diagnose MRSA infection nor to guide or monitor treatment for MRSA infections. Performed at [facility], [place] [address] [place], [place] [address]   Anaerobic culture     Status: None   Collection Time: 06/06/18  8:30 AM  Result Value Ref Range Status   Specimen Description WOUND LUMBAR  Final   Special Requests PATIENT ON FOLLOWING MEROPENEM  Final   Culture   Final  NO ANAEROBES ISOLATED Performed at [facility], [place] [address], [place],  [place] [address]    Report Status 06/11/2018 FINAL  Final  Aerobic Culture (superficial specimen)     Status: None   Collection Time: 06/06/18  8:30 AM  Result Value Ref Range Status   Specimen Description WOUND LUMBAR  Final   Special Requests PATIENT ON FOLLOWING MEROPENEM  Final   Gram Stain   Final    RARE WBC PRESENT, PREDOMINANTLY MONONUCLEAR NO ORGANISMS SEEN Performed at [facility], [place] [address] [place], [place] [address]    Culture FEW PROTEUS MIRABILIS  Final   Report Status 06/09/2018 FINAL  Final   Organism ID, Bacteria PROTEUS MIRABILIS  Final      Susceptibility   Proteus mirabilis - MIC*    AMPICILLIN <=2 SENSITIVE Sensitive     CEFAZOLIN <=4 SENSITIVE Sensitive     CEFEPIME <=1 SENSITIVE Sensitive     CEFTAZIDIME <=1 SENSITIVE Sensitive     CEFTRIAXONE <=1 SENSITIVE Sensitive     CIPROFLOXACIN <=0.25 SENSITIVE Sensitive     GENTAMICIN <=1 SENSITIVE Sensitive     TRIMETH/SULFA <=20 SENSITIVE Sensitive     AMPICILLIN/SULBACTAM <=2 SENSITIVE Sensitive     PIP/TAZO <=4 SENSITIVE Sensitive     * FEW PROTEUS MIRABILIS     Labs: BNP (last 3 results) Recent Labs    06/04/18 0245  BNP 1,779.3*   Basic Metabolic Panel: Recent Labs  Lab 06/06/18 0314 06/06/18 1129 06/07/18 0432 06/08/18 0457 06/09/18 0506 06/11/18 0357  NA 136  --  135 134* 138 136  K 4.0  --  4.4 4.3 5.0 4.4  CL 107  --  105 105 104 105  CO2 23  --  25 22 26 25   GLUCOSE 92  --  136* 122* 90 113*  BUN 45*  --  47* 58* 54* 48*  CREATININE 2.10*  --  1.94* 2.21* 1.99* 2.04*  CALCIUM 8.3*  --  8.7* 8.3* 8.8* 8.7*  MG 1.8 1.8 1.9  --  1.9  --    Liver Function Tests: Recent Labs  Lab 06/05/18 1455 06/06/18 0314 06/07/18 0432 06/08/18 0457 06/09/18 0506  AST 15 13* 13* 16 18  ALT 11 11 11 10 13   ALKPHOS 58 60 58 63 61  BILITOT 0.6 0.6 0.7 0.6 0.6  PROT 6.2* 6.0* 6.6 5.8* 6.7  ALBUMIN 1.6* 1.6* 1.8* 1.8* 2.0*   No results for input(s): LIPASE, AMYLASE in the last 168  hours. No results for input(s): AMMONIA in the last 168 hours. CBC: Recent Labs  Lab 06/06/18 0314 06/07/18 0432 06/08/18 0457 06/09/18 0506 06/11/18 0357  WBC 15.0* 21.6* 18.4* 19.0* 13.3*  HGB 8.1* 7.9* 8.4* 9.7* 8.7*  HCT 25.3* 25.7* 27.6* 31.4* 29.1*  MCV 81.4 82.9 83.9 84.0 84.6  PLT 352 386 362 392 397   Cardiac Enzymes: No results for input(s): CKTOTAL, CKMB, CKMBINDEX, TROPONINI in the last 168 hours. BNP: Invalid input(s): POCBNP CBG: Recent Labs  Lab 06/05/18 1200 06/06/18 0922 06/07/18 0823 06/08/18 0744  GLUCAP 105* 101* 169* 136*   D-Dimer No results for input(s): DDIMER in the last 72 hours. Hgb A1c No results for input(s): HGBA1C in the last 72 hours. Lipid Profile No results for input(s): CHOL, HDL, LDLCALC, TRIG, CHOLHDL, LDLDIRECT in the last 72 hours. Thyroid function studies No results for input(s): TSH, T4TOTAL, T3FREE, THYROIDAB in the last 72 hours.  Invalid input(s): FREET3 Anemia work up No results for  input(s): VITAMINB12, FOLATE, FERRITIN, TIBC, IRON, RETICCTPCT in the last 72 hours. Urinalysis    Component Value Date/Time   COLORURINE YELLOW 07/21/2017 1001   APPEARANCEUR Sl Cloudy (A) 07/21/2017 1001   APPEARANCEUR Cloudy 12/14/2013 1727   LABSPEC >=1.030 (A) 07/21/2017 1001   LABSPEC 1.012 12/14/2013 1727   PHURINE 5.5 07/21/2017 1001   GLUCOSEU NEGATIVE 07/21/2017 1001   HGBUR NEGATIVE 07/21/2017 1001   BILIRUBINUR NEGATIVE 07/21/2017 1001   BILIRUBINUR Negative 12/14/2013 1727   KETONESUR NEGATIVE 07/21/2017 1001   PROTEINUR 30 (A) 03/29/2017 2254   UROBILINOGEN 0.2 07/21/2017 1001   NITRITE NEGATIVE 07/21/2017 1001   LEUKOCYTESUR NEGATIVE 07/21/2017 1001   LEUKOCYTESUR 3+ 12/14/2013 1727   Sepsis Labs Invalid input(s): PROCALCITONIN,  WBC,  LACTICIDVEN Microbiology Recent Results (from the past 240 hour(s))  Aerobic/Anaerobic Culture (surgical/deep wound)     Status: None   Collection Time: 06/04/18  6:11 AM  Result  Value Ref Range Status   Specimen Description   Final    BACK Performed at [facility], [address] [place], [place] [address]    Special Requests   Final    Normal Performed at [facility], [address] [place], [place] [address]    Gram Stain   Final    RARE WBC PRESENT, PREDOMINANTLY PMN RARE GRAM POSITIVE RODS RARE GRAM POSITIVE COCCI    Culture   Final    FEW PROTEUS MIRABILIS NO ANAEROBES ISOLATED Performed at [facility], [place] [address], [place], [place] [address]    Report Status 06/09/2018 FINAL  Final   Organism ID, Bacteria PROTEUS MIRABILIS  Final      Susceptibility   Proteus mirabilis - MIC*    AMPICILLIN <=2 SENSITIVE Sensitive     CEFAZOLIN <=4 SENSITIVE Sensitive     CEFEPIME <=1 SENSITIVE Sensitive     CEFTAZIDIME <=1 SENSITIVE Sensitive     CEFTRIAXONE <=1 SENSITIVE Sensitive     CIPROFLOXACIN <=0.25 SENSITIVE Sensitive     GENTAMICIN <=1 SENSITIVE Sensitive     IMIPENEM 8 INTERMEDIATE Intermediate     TRIMETH/SULFA <=20 SENSITIVE Sensitive     AMPICILLIN/SULBACTAM <=2 SENSITIVE Sensitive     PIP/TAZO <=4 SENSITIVE Sensitive     * FEW PROTEUS MIRABILIS  MRSA PCR Screening     Status: None   Collection Time: 06/04/18  6:13 AM  Result Value Ref Range Status   MRSA by PCR NEGATIVE NEGATIVE Final    Comment:        The GeneXpert MRSA Assay (FDA approved for NASAL specimens only), is one component of a comprehensive MRSA colonization surveillance program. It is not intended to diagnose MRSA infection nor to guide or monitor treatment for MRSA infections. Performed at [facility], [address] [place], [place] [address]   Culture, blood (Routine X 2) w Reflex to ID Panel     Status: None   Collection Time: 06/04/18 11:15 PM  Result Value Ref Range Status   Specimen Description BLOOD RIGHT HAND  Final   Special Requests   Final    BOTTLES DRAWN AEROBIC AND ANAEROBIC Blood Culture adequate volume    Culture   Final    NO GROWTH 5 DAYS Performed at [facility] [address] [place], [place] [address]    Report Status 06/09/2018 FINAL  Final  Culture, blood (Routine X 2) w Reflex to ID Panel     Status: None   Collection Time: 06/04/18 11:17 PM  Result Value Ref Range Status   Specimen Description BLOOD LEFT ANTECUBITAL  Final   Special Requests   Final    BOTTLES DRAWN AEROBIC AND ANAEROBIC Blood Culture adequate volume   Culture   Final    NO GROWTH 5 DAYS Performed at [facility], [address] [place], [place] [address]    Report Status 06/09/2018 FINAL  Final  MRSA PCR Screening     Status: None   Collection Time: 06/05/18  4:37 PM  Result Value Ref Range Status   MRSA by PCR NEGATIVE NEGATIVE Final    Comment:        The GeneXpert MRSA Assay (FDA approved for NASAL specimens only), is one component of a comprehensive MRSA colonization surveillance program. It is not intended to diagnose MRSA infection nor to guide or monitor treatment for MRSA infections. Performed at [facility] [facility], [place] [address] [place], [place] [address]   Anaerobic culture     Status: None   Collection Time: 06/06/18  8:30 AM  Result Value Ref Range Status   Specimen Description WOUND LUMBAR  Final   Special Requests PATIENT ON FOLLOWING MEROPENEM  Final   Culture   Final    NO ANAEROBES ISOLATED Performed at [facility], [place] [address], [place], [place] [address]    Report Status 06/11/2018 FINAL  Final  Aerobic Culture (superficial specimen)     Status: None   Collection Time: 06/06/18  8:30 AM  Result Value Ref Range Status   Specimen Description WOUND LUMBAR  Final   Special Requests PATIENT ON FOLLOWING MEROPENEM  Final   Gram Stain   Final    RARE WBC PRESENT, PREDOMINANTLY MONONUCLEAR NO ORGANISMS SEEN Performed at [facility], [place] [address] [place],  [address]    Culture FEW PROTEUS MIRABILIS  Final   Report Status  06/09/2018 FINAL  Final   Organism ID, Bacteria PROTEUS MIRABILIS  Final      Susceptibility   Proteus mirabilis - MIC*    AMPICILLIN <=2 SENSITIVE Sensitive     CEFAZOLIN <=4 SENSITIVE Sensitive     CEFEPIME <=1 SENSITIVE Sensitive     CEFTAZIDIME <=1 SENSITIVE Sensitive     CEFTRIAXONE <=1 SENSITIVE Sensitive     CIPROFLOXACIN <=0.25 SENSITIVE Sensitive     GENTAMICIN <=1 SENSITIVE Sensitive     TRIMETH/SULFA <=20 SENSITIVE Sensitive     AMPICILLIN/SULBACTAM <=2 SENSITIVE Sensitive     PIP/TAZO <=4 SENSITIVE Sensitive     * FEW PROTEUS MIRABILIS     Time coordinating discharge: 34 minutes  SIGNED:   Hosie Poisson, MD  [facility] 06/12/2018, 8:14 AM Pager   If 7PM-7AM, please contact night-coverage [email or web address] Password [email or web address]

## 2018-06-11 NOTE — Care Management Note (Signed)
Case Management Note  Patient Details  Name: Taylor Tyler MRN: [id number] Date of Birth: Dec 08, 1940  Subjective/Objective:    Pt presented to [facility] on 06/05/18 and diagnosed with postop site infection and abscess with pus drainage, poorly controlled blood pressure; she was admitted to the hospital seen by ID, her case was discussed by neurosurgery at [facility] and then she was transferred here for further care. On 11/3 pt underwent lumbar wound exploration, debridement and closure of open wound.  PTA, pt independent with assistive device, lives alone.              Action/Plan: PT recommending HH follow up; pt states she will have intermittent supervision at home from multiple family members and friends.  She will also need HHRN for follow up of hemovac in back.  Pt agreeable to [facility] services; referral to [facility] for [facility] needs, per pt choice.  Start of care 24-48h post dc date.  No DME needs, per pt.    Expected Discharge Date:                  Expected Discharge Plan:  [place]  In-House Referral:     Discharge planning Services  CM Consult  Post Acute Care Choice:  Home Health Choice offered to:  Patient  DME Arranged:    DME Agency:     HH Arranged:  RN, PT [place] Agency:  [place]  Status of Service:  Completed, signed off  If discussed at [place] of Stay Meetings, dates discussed:    Additional Comments:  Reinaldo Raddle, RN, BSN  Trauma/Neuro ICU Case Manager [phone]

## 2018-06-11 NOTE — Progress Notes (Signed)
[name], female   DOB: Dec 05, 1940, 77 y.o.   MRN: [id number] Looks good, awake and movers legs well, pain controlled. Ok to d/c home from our standpoint with 2 weeks f/u with [employer]

## 2018-06-12 MED ORDER — AMOXICILLIN 500 MG PO CAPS: 500.0000 mg | ORAL_CAPSULE | Freq: Three times a day (TID) | ORAL | 0 refills | Status: AC

## 2018-06-12 MED ORDER — AMLODIPINE BESYLATE 10 MG PO TABS: 10.0000 mg | ORAL_TABLET | Freq: Every day | ORAL | 0 refills | Status: DC

## 2018-06-12 NOTE — Progress Notes (Addendum)
Physical Therapy Treatment Patient Details Name: Taylor Tyler MRN: [id number] DOB: 1941-03-15 Today's Date: 06/12/2018    History of Present Illness 77 y.o. AA female with H/O Morbid Obesity, CKD 3, DM2, HTN, anemia of chronic disease, fatty liver, hypothyroidism, status post L spine surgery along with L5-S1 fusion done in July 2018 at [facility] by Dr. Cyndy Freeze. Presented to [facility] a few days ago was diagnosed with postop site infection and abscess with pus drainage, poorly controlled blood pressure, she was admitted to the hospital seen by ID, her case was discussed by neurosurgery at [facility] and then she was transferred here for further care. On 11/3 pt underwent lumbar wound exploration, debridement and closure of open wound.    PT Comments    Pt is progressing well with gait and mobility, min guard assist to supervision overall for hallway ambulation with RW.  Pt is due to d/c home today.   Follow Up Recommendations  Home health PT;Supervision - Intermittent     Equipment Recommendations  None recommended by PT    Recommendations for Other Services   NA     Precautions / Restrictions Precautions Precautions: Back Precaution Comments: for comfort Other Brace/Splint: drain d/c by RN at beginning of session Restrictions Weight Bearing Restrictions: No    Mobility  Bed Mobility               General bed mobility comments: Pt was seated EOB  Transfers Overall transfer level: Needs assistance Equipment used: Rolling walker (2 wheeled) Transfers: Sit to/from Stand Sit to Stand: Min guard         General transfer comment: Min guard assist for safety and balance during transitions.   Ambulation/Gait Ambulation/Gait assistance: Supervision Gait Distance (Feet): 130 Feet Assistive device: Rolling walker (2 wheeled) Gait Pattern/deviations: Step-through pattern;Shuffle;Trunk flexed     General Gait Details: Verbal cues for  upright posture.  I asked pt if she was ready for transition back to her cane and she said, "no", so cane was not attempted today.  She also only has one small STE, so she did not feel the need to practice stairs today.          Balance Overall balance assessment: Needs assistance Sitting-balance support: Feet supported;No upper extremity supported;Single extremity supported Sitting balance-Leahy Scale: Good     Standing balance support: Single extremity supported Standing balance-Leahy Scale: Fair                              Cognition Arousal/Alertness: Awake/alert Behavior During Therapy: WFL for tasks assessed/performed Overall Cognitive Status: Within Functional Limits for tasks assessed                                           General Comments General comments (skin integrity, edema, etc.): Discussed walking in the house three times per day. Assisted pt in getting dressed in preparation for d/c today with min assist to help pull pants up and get pants over feet.       Pertinent Vitals/Pain Pain Assessment: Faces Faces Pain Scale: Hurts little more Pain Location: back  Pain Descriptors / Indicators: Sore Pain Intervention(s): Limited activity within patient's tolerance;Monitored during session;Repositioned           PT Goals (current goals can now be found in the care  plan section) Acute Rehab PT Goals Patient Stated Goal: home Progress towards PT goals: Progressing toward goals    Frequency    Min 3X/week      PT Plan Current plan remains appropriate       AM-PAC PT "6 Clicks" Daily Activity  Outcome Measure  Difficulty turning over in bed (including adjusting bedclothes, sheets and blankets)?: A Little Difficulty moving from lying on back to sitting on the side of the bed? : A Little Difficulty sitting down on and standing up from a chair with arms (e.g., wheelchair, bedside commode, etc,.)?: Unable Help needed moving  to and from a bed to chair (including a wheelchair)?: A Little Help needed walking in hospital room?: A Little Help needed climbing 3-5 steps with a railing? : A Little 6 Click Score: 16    End of Session   Activity Tolerance: Patient tolerated treatment well Patient left: in chair;with call bell/phone within reach Nurse Communication: Mobility status PT Visit Diagnosis: Other abnormalities of gait and mobility (R26.89)     Time: 9937-1696 PT Time Calculation (min) (ACUTE ONLY): 21 min  Charges:  $Gait Training: 8-22 mins                    Laiken Sandy B. Song Garris, PT, DPT  Acute Rehabilitation [phone] pager #[phone] office    06/12/2018, 10:19 AM

## 2018-06-12 NOTE — Progress Notes (Signed)
NEUROSURGERY PROGRESS NOTE  Doing well. No complaints of back pain. Ok to d/c from nsgy standpoint. F/u nundkumar in 2 weeks  Temp:  [97.5 F (36.4 C)-98.4 F (36.9 C)] 97.5 F (36.4 C) (11/09 0812) Pulse Rate:  [55-62] 57 (11/09 0812) BP: (142-171)/(50-67) 158/67 (11/09 0812) SpO2:  [94 %-100 %] 94 % (11/09 0812)    Eleonore Chiquito, NP 06/12/2018 8:21 AM

## 2018-06-14 ENCOUNTER — Telehealth: Payer: Self-pay | Admitting: Internal Medicine

## 2018-06-14 DIAGNOSIS — E785 Hyperlipidemia, unspecified: Secondary | ICD-10-CM | POA: Diagnosis not present

## 2018-06-14 DIAGNOSIS — Z981 Arthrodesis status: Secondary | ICD-10-CM | POA: Diagnosis not present

## 2018-06-14 DIAGNOSIS — I2510 Atherosclerotic heart disease of native coronary artery without angina pectoris: Secondary | ICD-10-CM | POA: Diagnosis not present

## 2018-06-14 DIAGNOSIS — E1122 Type 2 diabetes mellitus with diabetic chronic kidney disease: Secondary | ICD-10-CM | POA: Diagnosis not present

## 2018-06-14 DIAGNOSIS — N183 Chronic kidney disease, stage 3 (moderate): Secondary | ICD-10-CM | POA: Diagnosis not present

## 2018-06-14 DIAGNOSIS — D631 Anemia in chronic kidney disease: Secondary | ICD-10-CM | POA: Diagnosis not present

## 2018-06-14 DIAGNOSIS — M1990 Unspecified osteoarthritis, unspecified site: Secondary | ICD-10-CM | POA: Diagnosis not present

## 2018-06-14 DIAGNOSIS — I129 Hypertensive chronic kidney disease with stage 1 through stage 4 chronic kidney disease, or unspecified chronic kidney disease: Secondary | ICD-10-CM | POA: Diagnosis not present

## 2018-06-14 DIAGNOSIS — L02212 Cutaneous abscess of back [any part, except buttock]: Secondary | ICD-10-CM | POA: Diagnosis not present

## 2018-06-14 DIAGNOSIS — M48061 Spinal stenosis, lumbar region without neurogenic claudication: Secondary | ICD-10-CM | POA: Diagnosis not present

## 2018-06-14 DIAGNOSIS — Z792 Long term (current) use of antibiotics: Secondary | ICD-10-CM | POA: Diagnosis not present

## 2018-06-14 DIAGNOSIS — Z9181 History of falling: Secondary | ICD-10-CM | POA: Diagnosis not present

## 2018-06-14 DIAGNOSIS — K5730 Diverticulosis of large intestine without perforation or abscess without bleeding: Secondary | ICD-10-CM | POA: Diagnosis not present

## 2018-06-14 DIAGNOSIS — E039 Hypothyroidism, unspecified: Secondary | ICD-10-CM | POA: Diagnosis not present

## 2018-06-14 DIAGNOSIS — T8141XA Infection following a procedure, superficial incisional surgical site, initial encounter: Secondary | ICD-10-CM | POA: Diagnosis not present

## 2018-06-14 NOTE — Telephone Encounter (Signed)
Transition Care Management Follow-up Telephone Call  How have you been since you were released from the hospital? Patient says they are feeling well but concerned about swelling to bilateral legs patient is taking furosemide 40 mg twice daily. Advised patient to keep elevated when sitting or lying down. Patient voiced understanding HFU scheduled for  06/23/18, hospital requesting CBC and BMP in one week?   Do you understand why you were in the hospital? yes   Do you understand the discharge instrcutions? yes  Items Reviewed:  Medications reviewed: yes  Allergies reviewed: yes  Dietary changes reviewed: yes  Referrals reviewed: yes   Functional Questionnaire:   Activities of Daily Living (ADLs):   She states they are independent in the following: ambulation, bathing and hygiene, feeding, continence, grooming, toileting and dressing States they require assistance with the following: Patient staed no assistance required.   Any transportation issues/concerns?: no   Any patient concerns? no   Confirmed importance and date/time of follow-up visits scheduled: yes   Confirmed with patient if condition begins to worsen call PCP or go to the ER.  Patient was given the Call-a-Nurse line [phone]: yes

## 2018-06-14 NOTE — Telephone Encounter (Signed)
Copied from [place] [phone]. Topic: Quick Communication - Home Health Verbal Orders >> Jun 14, 2018 12:54 PM Blase Mess A wrote: Caller/Agency: [facility] to leave ok on her voice mail Requesting OT/PT/Skilled Nursing/Social Work: Skilled Nursing for Wound Care Frequency: 3 week 3, 2 week 2,

## 2018-06-14 NOTE — Telephone Encounter (Signed)
Verbal given for nursing.

## 2018-06-15 ENCOUNTER — Telehealth: Payer: Self-pay | Admitting: Internal Medicine

## 2018-06-15 DIAGNOSIS — D631 Anemia in chronic kidney disease: Secondary | ICD-10-CM | POA: Diagnosis not present

## 2018-06-15 DIAGNOSIS — Z792 Long term (current) use of antibiotics: Secondary | ICD-10-CM | POA: Diagnosis not present

## 2018-06-15 DIAGNOSIS — I129 Hypertensive chronic kidney disease with stage 1 through stage 4 chronic kidney disease, or unspecified chronic kidney disease: Secondary | ICD-10-CM | POA: Diagnosis not present

## 2018-06-15 DIAGNOSIS — E1122 Type 2 diabetes mellitus with diabetic chronic kidney disease: Secondary | ICD-10-CM | POA: Diagnosis not present

## 2018-06-15 DIAGNOSIS — M1990 Unspecified osteoarthritis, unspecified site: Secondary | ICD-10-CM | POA: Diagnosis not present

## 2018-06-15 DIAGNOSIS — K5730 Diverticulosis of large intestine without perforation or abscess without bleeding: Secondary | ICD-10-CM | POA: Diagnosis not present

## 2018-06-15 DIAGNOSIS — E039 Hypothyroidism, unspecified: Secondary | ICD-10-CM | POA: Diagnosis not present

## 2018-06-15 DIAGNOSIS — N183 Chronic kidney disease, stage 3 (moderate): Secondary | ICD-10-CM | POA: Diagnosis not present

## 2018-06-15 DIAGNOSIS — Z981 Arthrodesis status: Secondary | ICD-10-CM | POA: Diagnosis not present

## 2018-06-15 DIAGNOSIS — I2510 Atherosclerotic heart disease of native coronary artery without angina pectoris: Secondary | ICD-10-CM | POA: Diagnosis not present

## 2018-06-15 DIAGNOSIS — Z9181 History of falling: Secondary | ICD-10-CM | POA: Diagnosis not present

## 2018-06-15 DIAGNOSIS — L02212 Cutaneous abscess of back [any part, except buttock]: Secondary | ICD-10-CM | POA: Diagnosis not present

## 2018-06-15 DIAGNOSIS — E785 Hyperlipidemia, unspecified: Secondary | ICD-10-CM | POA: Diagnosis not present

## 2018-06-15 DIAGNOSIS — T8141XA Infection following a procedure, superficial incisional surgical site, initial encounter: Secondary | ICD-10-CM | POA: Diagnosis not present

## 2018-06-15 DIAGNOSIS — M48061 Spinal stenosis, lumbar region without neurogenic claudication: Secondary | ICD-10-CM | POA: Diagnosis not present

## 2018-06-15 NOTE — Telephone Encounter (Signed)
Copied from [place] [phone]. Topic: Quick Communication - See Telephone Encounter >> Jun 15, 2018  2:31 PM Antonieta Iba C wrote: CRM for notification. See Telephone encounter for: 06/15/18.  Marcelle Smiling PT w/ [facility]   Vo for pt 2 week 2 and 1 week 1   CB: [phone]

## 2018-06-15 NOTE — Telephone Encounter (Signed)
Verbal orders given to Marcelle Smiling with [facility].

## 2018-06-16 ENCOUNTER — Telehealth: Payer: Self-pay

## 2018-06-16 DIAGNOSIS — I2510 Atherosclerotic heart disease of native coronary artery without angina pectoris: Secondary | ICD-10-CM | POA: Diagnosis not present

## 2018-06-16 DIAGNOSIS — M48061 Spinal stenosis, lumbar region without neurogenic claudication: Secondary | ICD-10-CM | POA: Diagnosis not present

## 2018-06-16 DIAGNOSIS — D631 Anemia in chronic kidney disease: Secondary | ICD-10-CM | POA: Diagnosis not present

## 2018-06-16 DIAGNOSIS — T8141XA Infection following a procedure, superficial incisional surgical site, initial encounter: Secondary | ICD-10-CM | POA: Diagnosis not present

## 2018-06-16 DIAGNOSIS — Z792 Long term (current) use of antibiotics: Secondary | ICD-10-CM | POA: Diagnosis not present

## 2018-06-16 DIAGNOSIS — N183 Chronic kidney disease, stage 3 (moderate): Secondary | ICD-10-CM | POA: Diagnosis not present

## 2018-06-16 DIAGNOSIS — I129 Hypertensive chronic kidney disease with stage 1 through stage 4 chronic kidney disease, or unspecified chronic kidney disease: Secondary | ICD-10-CM | POA: Diagnosis not present

## 2018-06-16 DIAGNOSIS — K5730 Diverticulosis of large intestine without perforation or abscess without bleeding: Secondary | ICD-10-CM | POA: Diagnosis not present

## 2018-06-16 DIAGNOSIS — E785 Hyperlipidemia, unspecified: Secondary | ICD-10-CM | POA: Diagnosis not present

## 2018-06-16 DIAGNOSIS — Z9181 History of falling: Secondary | ICD-10-CM | POA: Diagnosis not present

## 2018-06-16 DIAGNOSIS — M1990 Unspecified osteoarthritis, unspecified site: Secondary | ICD-10-CM | POA: Diagnosis not present

## 2018-06-16 DIAGNOSIS — Z981 Arthrodesis status: Secondary | ICD-10-CM | POA: Diagnosis not present

## 2018-06-16 DIAGNOSIS — E1122 Type 2 diabetes mellitus with diabetic chronic kidney disease: Secondary | ICD-10-CM | POA: Diagnosis not present

## 2018-06-16 DIAGNOSIS — L02212 Cutaneous abscess of back [any part, except buttock]: Secondary | ICD-10-CM | POA: Diagnosis not present

## 2018-06-16 DIAGNOSIS — E039 Hypothyroidism, unspecified: Secondary | ICD-10-CM | POA: Diagnosis not present

## 2018-06-16 NOTE — Telephone Encounter (Signed)
Sent to PCP for the OK for verbal orders  

## 2018-06-16 NOTE — Telephone Encounter (Signed)
Copied from [place] [phone]. Topic: Quick Communication - Home Health Verbal Orders >> Jun 14, 2018 12:54 PM Blase Mess A wrote: Caller/Agency: [facility] to leave ok on her voice mail Requesting OT/PT/Skilled Nursing/Social Work: Skilled Nursing for Wound Care Frequency: 3 week 3, 2 week 2, >> Jun 16, 2018  3:25 PM Ivar Drape wrote: Sherlynn Stalls OT w/[facility] [phone] wanted the provider to know that the patient declined OT services.

## 2018-06-16 NOTE — Telephone Encounter (Signed)
Ok verbal orders   [facility]

## 2018-06-17 NOTE — Telephone Encounter (Signed)
Called the OK for verbal orders have been given.

## 2018-06-18 DIAGNOSIS — Z981 Arthrodesis status: Secondary | ICD-10-CM | POA: Diagnosis not present

## 2018-06-18 DIAGNOSIS — M48061 Spinal stenosis, lumbar region without neurogenic claudication: Secondary | ICD-10-CM | POA: Diagnosis not present

## 2018-06-18 DIAGNOSIS — D631 Anemia in chronic kidney disease: Secondary | ICD-10-CM | POA: Diagnosis not present

## 2018-06-18 DIAGNOSIS — T8141XA Infection following a procedure, superficial incisional surgical site, initial encounter: Secondary | ICD-10-CM | POA: Diagnosis not present

## 2018-06-18 DIAGNOSIS — Z9181 History of falling: Secondary | ICD-10-CM | POA: Diagnosis not present

## 2018-06-18 DIAGNOSIS — L02212 Cutaneous abscess of back [any part, except buttock]: Secondary | ICD-10-CM | POA: Diagnosis not present

## 2018-06-18 DIAGNOSIS — N183 Chronic kidney disease, stage 3 (moderate): Secondary | ICD-10-CM | POA: Diagnosis not present

## 2018-06-18 DIAGNOSIS — E785 Hyperlipidemia, unspecified: Secondary | ICD-10-CM | POA: Diagnosis not present

## 2018-06-18 DIAGNOSIS — I2510 Atherosclerotic heart disease of native coronary artery without angina pectoris: Secondary | ICD-10-CM | POA: Diagnosis not present

## 2018-06-18 DIAGNOSIS — E039 Hypothyroidism, unspecified: Secondary | ICD-10-CM | POA: Diagnosis not present

## 2018-06-18 DIAGNOSIS — E1122 Type 2 diabetes mellitus with diabetic chronic kidney disease: Secondary | ICD-10-CM | POA: Diagnosis not present

## 2018-06-18 DIAGNOSIS — M1990 Unspecified osteoarthritis, unspecified site: Secondary | ICD-10-CM | POA: Diagnosis not present

## 2018-06-18 DIAGNOSIS — K5730 Diverticulosis of large intestine without perforation or abscess without bleeding: Secondary | ICD-10-CM | POA: Diagnosis not present

## 2018-06-18 DIAGNOSIS — I129 Hypertensive chronic kidney disease with stage 1 through stage 4 chronic kidney disease, or unspecified chronic kidney disease: Secondary | ICD-10-CM | POA: Diagnosis not present

## 2018-06-18 DIAGNOSIS — Z792 Long term (current) use of antibiotics: Secondary | ICD-10-CM | POA: Diagnosis not present

## 2018-06-21 ENCOUNTER — Encounter (HOSPITAL_COMMUNITY): Payer: Self-pay | Admitting: *Deleted

## 2018-06-21 ENCOUNTER — Other Ambulatory Visit: Payer: Self-pay

## 2018-06-21 ENCOUNTER — Other Ambulatory Visit: Payer: Self-pay | Admitting: Neurosurgery

## 2018-06-21 NOTE — H&P (Signed)
Chief Complaint   Back pain  HPI   HPI: Taylor Tyler is a 77 y.o. female  With recent hospitalization from 11/2-11/9 due to  Acute on chronic lower back pain complicated by wound infection.  She underwent exploration, debridement and closure of lumbar wound on 06/06/2018.  There were no postoperative complications.  Cultures were positive for Proteus mirabilis.   She was discharged on 2 weeks of amoxicillin.  Last a tentatively 11/20.   unfortunately, she was seen in clinic on 06/21/2018   And was noted to have recurrent drainage from her lumbar wound. She denied fevers.  She presents today for wound clean out and placement of wound VAC.  She is without any concerns.  Patient Active Problem List   Diagnosis Date Noted  . Infection and inflammatory reaction due to internal fixation device of spine, initial encounter ([place]) 06/05/2018  . Neuropathy 01/08/2018  . Fatty liver 01/05/2018  . Chronic pain of both knees 01/05/2018  . Ventral hernia 01/05/2018  . CKD (chronic kidney disease) stage 4, GFR 15-29 ml/min (HCC) 07/21/2017  . Vitamin D deficiency 07/21/2017  . Essential hypertension   . Postoperative wound infection 04/02/2017  . Anemia 03/14/2017  . Acute blood loss anemia 03/14/2017  . Acute renal failure superimposed on stage 3 chronic kidney disease ([place]) 03/14/2017  . Obesity, Class III, BMI 40-49.9 (morbid obesity) ([place]) 02/27/2017  . DM (diabetes mellitus), type 2 with renal complications ([place]) 84/69/6295  . Status post lumbar surgery 02/24/2017  . CKD (chronic kidney disease) stage 3, GFR 30-59 ml/min (HCC) 01/15/2017  . Hypothyroidism 01/15/2017  . Diverticulosis 01/15/2017  . HLD (hyperlipidemia) 01/15/2017  . Spinal stenosis of lumbar region 01/15/2017  . Disease of pancreas 07/21/2012    PMH: Past Medical History:  Diagnosis Date  . Acute blood loss anemia 03/14/2017  . Acute renal failure superimposed on stage 3 chronic kidney disease ([place]) 03/14/2017  . AKI  (acute kidney injury) ([place]) 02/28/2017  . Altered mental status 03/29/2017  . Anemia   . Aortic atherosclerosis ([place])   . Arthritis    "joints might ache at times; not that bad" (06/10/2018)  . Bilateral lower extremity edema 03/04/2017  . Bradycardia   . Chronic kidney disease    ?? renal insufficiency,   . CKD (chronic kidney disease) stage 3, GFR 30-59 ml/min (HCC) 01/15/2017   ?? renal insufficiency, which she thinks is coming from "all these medications"  . Diabetes mellitus without complication ([place])    diagnosed 4-5 yrs ago  . Disease of pancreas   . Diverticulitis    s/p perforation and partial colectomy 01/27/14 with 3 benign lymph nodes   . Diverticulosis 01/15/2017  . DJD (degenerative joint disease)   . DM (diabetes mellitus), type 2 with renal complications ([place]) 2/84/1324  . Fatty liver   . Hypertension   . Hypothyroidism    "had radiation" (06/10/2018)  . Kidney stone   . Lethargy 02/28/2017  . Obesity, Class III, BMI 40-49.9 (morbid obesity) ([place]) 02/27/2017  . Pleural lipoma   . Postoperative wound infection 04/02/2017  . Spinal stenosis of lumbar region 01/15/2017  . Status post lumbar surgery   . Wound healing, delayed    back    PSH: Past Surgical History:  Procedure Laterality Date  . ABDOMINAL EXPOSURE N/A 02/24/2017   Procedure: ABDOMINAL EXPOSURE;  Surgeon: Angelia Mould, MD;  Location: [facility];  Service: Vascular;  Laterality: N/A;  . ABDOMINAL HYSTERECTOMY  1987   no  h/o abnormal paps   . ANTERIOR LAT LUMBAR FUSION N/A 02/24/2017   Procedure: Lumbar three- five Anterior lateral lumbar interbody fusion;  Surgeon: Ditty, Kevan Ny, MD;  Location: [facility];  Service: Neurosurgery;  Laterality: N/A;  L3-5 Anterior lateral lumbar interbody fusion with removal of coflex at L3-4, L4-5  . ANTERIOR LUMBAR FUSION N/A 02/24/2017   Procedure: Stage 1: Lumbar five-Sacral one Anterior lumbar interbody fusion;  Surgeon: Ditty, Kevan Ny, MD;  Location: [facility];   Service: Neurosurgery;  Laterality: N/A;  Stage 1: L5-S1 Anterior lumbar interbody fusion  . APPENDECTOMY    . APPLICATION OF ROBOTIC ASSISTANCE FOR SPINAL PROCEDURE N/A 02/26/2017   Procedure: APPLICATION OF ROBOTIC ASSISTANCE FOR SPINAL PROCEDURE;  Surgeon: Ditty, Kevan Ny, MD;  Location: [facility];  Service: Neurosurgery;  Laterality: N/A;  . APPLICATION OF WOUND VAC N/A 03/30/2017   Procedure: APPLICATION OF WOUND VAC;  Surgeon: Ditty, Kevan Ny, MD;  Location: [facility];  Service: Neurosurgery;  Laterality: N/A;  . BACK SURGERY  2013  . CATARACT EXTRACTION W/ INTRAOCULAR LENS IMPLANT Right   . CHOLECYSTECTOMY OPEN  1982  . COLON SURGERY  01/26/2014   desc.sigmoid colectomy and ventral hernia repair and splenic flexure mobilization  . COLON SURGERY    . DILATION AND CURETTAGE OF UTERUS    . HERNIA REPAIR    . LUMBAR LAMINECTOMY WITH SPINOUS PROCESS PLATE 2 LEVEL N/A 08/09/2692   Procedure: LUMBAR LAMINECTOMY/DECOMPRESSION MICRODISCECTOMY CoFlex;  Surgeon: Faythe Ghee, MD;  Location: [facility];  Service: Neurosurgery;  Laterality: N/A;  Lumbar three-four,Lumbar Four-Five Laminectomy with Coflex  . LUMBAR WOUND DEBRIDEMENT N/A 03/30/2017   Procedure: Lumbar wound exploration/debridement, placement of wound vac;  Surgeon: Ditty, Kevan Ny, MD;  Location: [facility];  Service: Neurosurgery;  Laterality: N/A;  Lumbar wound exploration/debridement, placement of wound vac  . LUMBAR WOUND DEBRIDEMENT N/A 06/06/2018   Procedure: LUMBAR WOUND DEBRIDEMENT/EXPLORATION;  Surgeon: Consuella Lose, MD;  Location: [facility];  Service: Neurosurgery;  Laterality: N/A;  . PARTIAL COLECTOMY     01/27/14 diverticulitis and 3 benign lymph nodes [facility] Dr. Pat Patrick   . REDUCTION MAMMAPLASTY Bilateral 1992  . TUBAL LIGATION    . VENTRAL HERNIA REPAIR  2014    No medications prior to admission.    SH: Social History   Tobacco Use  . Smoking status: Never Smoker  . Smokeless tobacco: Never Used  Substance  Use Topics  . Alcohol use: Not Currently    Frequency: Never    Comment: "nothing since age 73" (06/10/2018)  . Drug use: Never    MEDS: Prior to Admission medications   Medication Sig Start Date End Date Taking? Authorizing Provider  acetaminophen (TYLENOL) 500 MG tablet Take 1,000 mg by mouth 3 (three) times daily as needed for mild pain.    [provider]  amLODipine (NORVASC) 10 MG tablet Take 1 tablet (10 mg total) by mouth daily. 06/12/18   Hosie Poisson, MD  amoxicillin (AMOXIL) 500 MG capsule Take 1 capsule (500 mg total) by mouth 3 (three) times daily for 11 days. 06/12/18 06/23/18  Hosie Poisson, MD  carvedilol (COREG) 25 MG tablet Take 1 tablet (25 mg total) by mouth 2 (two) times daily with a meal. 06/12/18   Hosie Poisson, MD  cholecalciferol (VITAMIN D) 1000 units tablet Take 1,000 Units by mouth daily.    [provider]  cloNIDine (CATAPRES) 0.1 MG tablet Take 0.1 mg by mouth 2 (two) times daily.    [provider]  diclofenac sodium (VOLTAREN) 1 % GEL Apply 4 g topically 4 (four) times daily as needed (knees).    [provider]  docusate sodium (COLACE) 100 MG capsule Take 1 capsule (100 mg total) by mouth daily. 06/12/18   Hosie Poisson, MD  ferrous sulfate 325 (65 FE) MG tablet Take 325 mg by mouth daily with breakfast.    [provider]  furosemide (LASIX) 40 MG tablet Take 40 mg by mouth 2 (two) times daily.     [provider]  gabapentin (NEURONTIN) 300 MG capsule Take 300 mg by mouth at bedtime as needed (pain).    [provider]  hydrALAZINE (APRESOLINE) 50 MG tablet Take 1 tablet (50 mg total) by mouth every 8 (eight) hours. 06/11/18   Hosie Poisson, MD  isosorbide mononitrate (IMDUR) 30 MG 24 hr tablet Take 1 tablet (30 mg total) by mouth daily. Patient not taking: Reported on 06/05/2018 06/06/18   Cassandria Santee, MD  levothyroxine (SYNTHROID, LEVOTHROID) 112 MCG tablet Take 112 mcg by mouth daily before  breakfast.    [provider]  magnesium oxide (MAG-OX) 400 MG tablet Take 400 mg by mouth daily.    [provider]  senna (SENOKOT) 8.6 MG TABS tablet Take 1 tablet (8.6 mg total) by mouth daily. 06/12/18   Hosie Poisson, MD  vitamin C (ASCORBIC ACID) 500 MG tablet Take 500 mg by mouth daily.    [provider]    ALLERGY: Allergies  Allergen Reactions  . Clams [Shellfish Allergy] Swelling and Other (See Comments)    THROAT SWELLS NECK TURNS RED  . Hydralazine Other (See Comments)    CHEST TIGHTNESS Patient has tolerated multiple doses of hydralazine since allergy was listed   . Norvasc [Amlodipine Besylate] Swelling    Leg edema   . Milk-Related Compounds Diarrhea    Social History   Tobacco Use  . Smoking status: Never Smoker  . Smokeless tobacco: Never Used  Substance Use Topics  . Alcohol use: Not Currently    Frequency: Never    Comment: "nothing since age 56" (06/10/2018)     Family History  Problem Relation Age of Onset  . Diabetes Father   . Heart attack Father   . Hypertension Father   . Heart disease Father   . Kidney disease Father   . Diabetes Mother   . Hypertension Mother   . Heart disease Mother   . Kidney disease Mother   . Lupus Sister   . Heart disease Brother   . Diabetes Brother   . Heart disease Brother   . Diabetes Brother   . Lupus Sister      ROS   ROS  Exam   There were no vitals filed for this visit. General appearance: WDWN, NAD Eyes: No scleral injection Cardiovascular: Regular rate and rhythm without murmurs, rubs, gallops. No edema or variciosities. Distal pulses normal. Pulmonary: Effort normal, non-labored breathing Musculoskeletal:     Muscle tone upper extremities: Normal    Muscle tone lower extremities: Normal    Motor exam: Upper Extremities Deltoid Bicep Tricep Grip  Right 5/5 5/5 5/5 5/5  Left 5/5 5/5 5/5 5/5   Lower Extremity IP Quad PF DF EHL  Right 5/5 5/5 5/5 5/5 5/5  Left 5/5  5/5 5/5 5/5 5/5   Neurological Mental Status:    - Patient is awake, alert, oriented to person, place, month, year, and situation    - Patient is able to give a  clear and coherent history.    - No signs of aphasia or neglect Cranial Nerves    - II: Visual Fields are full. PERRL    - III/IV/VI: EOMI without ptosis or diploplia.     - V: Facial sensation is grossly normal    - VII: Facial movement is symmetric.     - VIII: hearing is intact to voice    - X: Uvula elevates symmetrically    - XI: Shoulder shrug is symmetric.    - XII: tongue is midline without atrophy or fasciculations.  Sensory: Sensation grossly intact to LT Deep Tendon Reflexes    - 2+ and symmetric in the biceps and patellae.  Plantars   - Toes are downgoing bilaterally.  Cerebellar    - FNF and HKS are intact bilaterally  wound Stitches in place, superior aspect of wound partially dehiscent. Wound dressing has purulent drainage.    Results - Imaging/Labs   No results found for this or any previous visit (from the past 48 hour(s)).  No results found.   Impression/Plan   77 y.o. female with chronic Proteus wound infection, with continued purulent drainage now approximately 2 weeks after wound debridement and primary closure.    She presents today for repeat wound debridement, washout and application of wound VAC for hopeful attempt at healing of the wound via secondary intention.  While in the office risks, benefits and alternatives to the procedure were discussed.  Patient stated in own language understanding & wished to proceed.

## 2018-06-22 ENCOUNTER — Encounter (HOSPITAL_COMMUNITY): Payer: Self-pay | Admitting: *Deleted

## 2018-06-22 ENCOUNTER — Ambulatory Visit (HOSPITAL_COMMUNITY)
Admission: RE | Admit: 2018-06-22 | Discharge: 2018-06-22 | Disposition: A | Discharge status: Home / Self Care | Payer: Medicare Other | Source: Ambulatory Visit | Attending: Neurosurgery | Admitting: Neurosurgery

## 2018-06-22 ENCOUNTER — Ambulatory Visit (HOSPITAL_COMMUNITY): Payer: Medicare Other | Admitting: Anesthesiology

## 2018-06-22 ENCOUNTER — Encounter (HOSPITAL_COMMUNITY)
Admission: RE | Disposition: A | Discharge status: Home / Self Care | Payer: Self-pay | Source: Ambulatory Visit | Attending: Neurosurgery

## 2018-06-22 DIAGNOSIS — I129 Hypertensive chronic kidney disease with stage 1 through stage 4 chronic kidney disease, or unspecified chronic kidney disease: Secondary | ICD-10-CM | POA: Diagnosis not present

## 2018-06-22 DIAGNOSIS — M25561 Pain in right knee: Secondary | ICD-10-CM | POA: Diagnosis not present

## 2018-06-22 DIAGNOSIS — D631 Anemia in chronic kidney disease: Secondary | ICD-10-CM | POA: Insufficient documentation

## 2018-06-22 DIAGNOSIS — B964 Proteus (mirabilis) (morganii) as the cause of diseases classified elsewhere: Secondary | ICD-10-CM | POA: Diagnosis not present

## 2018-06-22 DIAGNOSIS — Z9841 Cataract extraction status, right eye: Secondary | ICD-10-CM | POA: Insufficient documentation

## 2018-06-22 DIAGNOSIS — G8929 Other chronic pain: Secondary | ICD-10-CM | POA: Diagnosis not present

## 2018-06-22 DIAGNOSIS — Z91011 Allergy to milk products: Secondary | ICD-10-CM | POA: Insufficient documentation

## 2018-06-22 DIAGNOSIS — E1122 Type 2 diabetes mellitus with diabetic chronic kidney disease: Secondary | ICD-10-CM | POA: Insufficient documentation

## 2018-06-22 DIAGNOSIS — Z833 Family history of diabetes mellitus: Secondary | ICD-10-CM | POA: Insufficient documentation

## 2018-06-22 DIAGNOSIS — T8141XD Infection following a procedure, superficial incisional surgical site, subsequent encounter: Secondary | ICD-10-CM | POA: Insufficient documentation

## 2018-06-22 DIAGNOSIS — M48061 Spinal stenosis, lumbar region without neurogenic claudication: Secondary | ICD-10-CM | POA: Diagnosis not present

## 2018-06-22 DIAGNOSIS — Z8489 Family history of other specified conditions: Secondary | ICD-10-CM | POA: Insufficient documentation

## 2018-06-22 DIAGNOSIS — Z87442 Personal history of urinary calculi: Secondary | ICD-10-CM | POA: Insufficient documentation

## 2018-06-22 DIAGNOSIS — X58XXXD Exposure to other specified factors, subsequent encounter: Secondary | ICD-10-CM | POA: Insufficient documentation

## 2018-06-22 DIAGNOSIS — Z90710 Acquired absence of both cervix and uterus: Secondary | ICD-10-CM | POA: Insufficient documentation

## 2018-06-22 DIAGNOSIS — T8141XA Infection following a procedure, superficial incisional surgical site, initial encounter: Secondary | ICD-10-CM | POA: Insufficient documentation

## 2018-06-22 DIAGNOSIS — E1140 Type 2 diabetes mellitus with diabetic neuropathy, unspecified: Secondary | ICD-10-CM | POA: Insufficient documentation

## 2018-06-22 DIAGNOSIS — Z6838 Body mass index (BMI) 38.0-38.9, adult: Secondary | ICD-10-CM | POA: Insufficient documentation

## 2018-06-22 DIAGNOSIS — E559 Vitamin D deficiency, unspecified: Secondary | ICD-10-CM | POA: Insufficient documentation

## 2018-06-22 DIAGNOSIS — Z9049 Acquired absence of other specified parts of digestive tract: Secondary | ICD-10-CM | POA: Insufficient documentation

## 2018-06-22 DIAGNOSIS — Z9851 Tubal ligation status: Secondary | ICD-10-CM | POA: Insufficient documentation

## 2018-06-22 DIAGNOSIS — Z981 Arthrodesis status: Secondary | ICD-10-CM | POA: Diagnosis not present

## 2018-06-22 DIAGNOSIS — Z79899 Other long term (current) drug therapy: Secondary | ICD-10-CM | POA: Insufficient documentation

## 2018-06-22 DIAGNOSIS — T8140XA Infection following a procedure, unspecified, initial encounter: Secondary | ICD-10-CM | POA: Diagnosis not present

## 2018-06-22 DIAGNOSIS — M25562 Pain in left knee: Secondary | ICD-10-CM | POA: Diagnosis not present

## 2018-06-22 DIAGNOSIS — K760 Fatty (change of) liver, not elsewhere classified: Secondary | ICD-10-CM | POA: Diagnosis not present

## 2018-06-22 DIAGNOSIS — Z888 Allergy status to other drugs, medicaments and biological substances status: Secondary | ICD-10-CM | POA: Insufficient documentation

## 2018-06-22 DIAGNOSIS — Z23 Encounter for immunization: Secondary | ICD-10-CM | POA: Diagnosis not present

## 2018-06-22 DIAGNOSIS — N184 Chronic kidney disease, stage 4 (severe): Secondary | ICD-10-CM | POA: Diagnosis not present

## 2018-06-22 DIAGNOSIS — T8131XA Disruption of external operation (surgical) wound, not elsewhere classified, initial encounter: Secondary | ICD-10-CM | POA: Diagnosis present

## 2018-06-22 DIAGNOSIS — E785 Hyperlipidemia, unspecified: Secondary | ICD-10-CM | POA: Diagnosis not present

## 2018-06-22 DIAGNOSIS — E039 Hypothyroidism, unspecified: Secondary | ICD-10-CM | POA: Diagnosis not present

## 2018-06-22 DIAGNOSIS — Z8249 Family history of ischemic heart disease and other diseases of the circulatory system: Secondary | ICD-10-CM | POA: Insufficient documentation

## 2018-06-22 DIAGNOSIS — T8463XA Infection and inflammatory reaction due to internal fixation device of spine, initial encounter: Secondary | ICD-10-CM

## 2018-06-22 DIAGNOSIS — Z91013 Allergy to seafood: Secondary | ICD-10-CM | POA: Insufficient documentation

## 2018-06-22 HISTORY — PX: LUMBAR WOUND DEBRIDEMENT: SHX1988

## 2018-06-22 LAB — GLUCOSE, CAPILLARY
GLUCOSE-CAPILLARY: 96 mg/dL (ref 70–99)
Glucose-Capillary: 80 mg/dL (ref 70–99)

## 2018-06-22 LAB — CBC
HCT: 32.2 % — ABNORMAL LOW (ref 36.0–46.0)
Hemoglobin: 9.4 g/dL — ABNORMAL LOW (ref 12.0–15.0)
MCH: 25.1 pg — ABNORMAL LOW (ref 26.0–34.0)
MCHC: 29.2 g/dL — ABNORMAL LOW (ref 30.0–36.0)
MCV: 86.1 fL (ref 80.0–100.0)
Platelets: 539 10*3/uL — ABNORMAL HIGH (ref 150–400)
RBC: 3.74 MIL/uL — AB (ref 3.87–5.11)
RDW: 17.7 % — ABNORMAL HIGH (ref 11.5–15.5)
WBC: 7.2 10*3/uL (ref 4.0–10.5)
nRBC: 0 % (ref 0.0–0.2)

## 2018-06-22 LAB — BASIC METABOLIC PANEL WITH GFR
Anion gap: 7 (ref 5–15)
BUN: 25 mg/dL — ABNORMAL HIGH (ref 8–23)
CO2: 25 mmol/L (ref 22–32)
Calcium: 9.1 mg/dL (ref 8.9–10.3)
Chloride: 108 mmol/L (ref 98–111)
Creatinine, Ser: 1.66 mg/dL — ABNORMAL HIGH (ref 0.44–1.00)
GFR calc Af Amer: 34 mL/min — ABNORMAL LOW (ref >60)
GFR calc non Af Amer: 29 mL/min — ABNORMAL LOW (ref >60)
Glucose, Bld: 106 mg/dL — ABNORMAL HIGH (ref 70–99)
Potassium: 4 mmol/L (ref 3.5–5.1)
Sodium: 140 mmol/L (ref 135–145)

## 2018-06-22 SURGERY — LUMBAR WOUND DEBRIDEMENT
Anesthesia: General | Site: Back

## 2018-06-22 MED ORDER — LIDOCAINE 2% (20 MG/ML) 5 ML SYRINGE
INTRAMUSCULAR | Status: AC
  Filled 2018-06-22: qty 5

## 2018-06-22 MED ORDER — THROMBIN 5000 UNITS EX SOLR
OROMUCOSAL | Status: DC | PRN
  Administered 2018-06-22: 5 mL via TOPICAL

## 2018-06-22 MED ORDER — PROPOFOL 10 MG/ML IV BOLUS
INTRAVENOUS | Status: DC | PRN
  Administered 2018-06-22: 120 mg via INTRAVENOUS

## 2018-06-22 MED ORDER — THROMBIN 5000 UNITS EX SOLR
CUTANEOUS | Status: AC
  Filled 2018-06-22: qty 15000

## 2018-06-22 MED ORDER — ONDANSETRON HCL 4 MG/2ML IJ SOLN
INTRAMUSCULAR | Status: DC | PRN
  Administered 2018-06-22: 4 mg via INTRAVENOUS

## 2018-06-22 MED ORDER — ROCURONIUM BROMIDE 50 MG/5ML IV SOSY
PREFILLED_SYRINGE | INTRAVENOUS | Status: AC
  Filled 2018-06-22: qty 5

## 2018-06-22 MED ORDER — PROPOFOL 10 MG/ML IV BOLUS
INTRAVENOUS | Status: AC
  Filled 2018-06-22: qty 20

## 2018-06-22 MED ORDER — LACTATED RINGERS IV SOLN
INTRAVENOUS | Status: DC | PRN
  Administered 2018-06-22: 10:00:00 via INTRAVENOUS

## 2018-06-22 MED ORDER — FENTANYL CITRATE (PF) 100 MCG/2ML IJ SOLN
INTRAMUSCULAR | Status: DC | PRN
  Administered 2018-06-22: 50 ug via INTRAVENOUS

## 2018-06-22 MED ORDER — TRAMADOL HCL 50 MG PO TABS: 50.0000 mg | ORAL_TABLET | Freq: Two times a day (BID) | ORAL | 0 refills | Status: DC | PRN

## 2018-06-22 MED ORDER — SUCCINYLCHOLINE CHLORIDE 20 MG/ML IJ SOLN
INTRAMUSCULAR | Status: DC | PRN
  Administered 2018-06-22: 100 mg via INTRAVENOUS

## 2018-06-22 MED ORDER — ONDANSETRON HCL 4 MG/2ML IJ SOLN
INTRAMUSCULAR | Status: AC
  Filled 2018-06-22: qty 2

## 2018-06-22 MED ORDER — LIDOCAINE 2% (20 MG/ML) 5 ML SYRINGE
INTRAMUSCULAR | Status: DC | PRN
  Administered 2018-06-22: 30 mg via INTRAVENOUS

## 2018-06-22 MED ORDER — AMLODIPINE BESYLATE 10 MG PO TABS
10.0000 mg | ORAL_TABLET | Freq: Every day | ORAL | Status: DC
  Administered 2018-06-22: 10 mg via ORAL
  Filled 2018-06-22: qty 1

## 2018-06-22 MED ORDER — BUPIVACAINE HCL (PF) 0.5 % IJ SOLN
INTRAMUSCULAR | Status: AC
  Filled 2018-06-22: qty 30

## 2018-06-22 MED ORDER — MIDAZOLAM HCL 2 MG/2ML IJ SOLN
INTRAMUSCULAR | Status: AC
  Filled 2018-06-22: qty 2

## 2018-06-22 MED ORDER — PROPOFOL 10 MG/ML IV BOLUS
INTRAVENOUS | Status: AC
  Filled 2018-06-22: qty 40

## 2018-06-22 MED ORDER — AMLODIPINE BESYLATE 5 MG PO TABS: 5.0000 mg | ORAL_TABLET | Freq: Every day | ORAL | Status: DC

## 2018-06-22 MED ORDER — LIDOCAINE-EPINEPHRINE 1 %-1:100000 IJ SOLN
INTRAMUSCULAR | Status: AC
  Filled 2018-06-22: qty 1

## 2018-06-22 MED ORDER — FENTANYL CITRATE (PF) 250 MCG/5ML IJ SOLN
INTRAMUSCULAR | Status: AC
  Filled 2018-06-22: qty 5

## 2018-06-22 MED ORDER — SUGAMMADEX SODIUM 200 MG/2ML IV SOLN
INTRAVENOUS | Status: AC
  Filled 2018-06-22: qty 2

## 2018-06-22 MED ORDER — CARVEDILOL 25 MG PO TABS
25.0000 mg | ORAL_TABLET | Freq: Two times a day (BID) | ORAL | Status: DC
  Administered 2018-06-22: 25 mg via ORAL
  Filled 2018-06-22: qty 1

## 2018-06-22 MED ORDER — EPHEDRINE SULFATE 50 MG/ML IJ SOLN
INTRAMUSCULAR | Status: DC | PRN
  Administered 2018-06-22: 10 mg via INTRAVENOUS

## 2018-06-22 MED ORDER — PHENYLEPHRINE 40 MCG/ML (10ML) SYRINGE FOR IV PUSH (FOR BLOOD PRESSURE SUPPORT)
PREFILLED_SYRINGE | INTRAVENOUS | Status: AC
  Filled 2018-06-22: qty 10

## 2018-06-22 MED ORDER — SODIUM CHLORIDE 0.9 % IV SOLN
INTRAVENOUS | Status: DC | PRN
  Administered 2018-06-22: 500 mL

## 2018-06-22 MED ORDER — DEXAMETHASONE SODIUM PHOSPHATE 10 MG/ML IJ SOLN
INTRAMUSCULAR | Status: AC
  Filled 2018-06-22: qty 1

## 2018-06-22 MED ORDER — LACTATED RINGERS IV SOLN: INTRAVENOUS | Status: DC

## 2018-06-22 MED ORDER — 0.9 % SODIUM CHLORIDE (POUR BTL) OPTIME
TOPICAL | Status: DC | PRN
  Administered 2018-06-22: 1000 mL

## 2018-06-22 SURGICAL SUPPLY — 45 items
BAG DECANTER FOR FLEXI CONT (MISCELLANEOUS) ×3 IMPLANT
CANISTER SUCT 3000ML PPV (MISCELLANEOUS) ×3 IMPLANT
CARTRIDGE OIL MAESTRO DRILL (MISCELLANEOUS) ×1 IMPLANT
COVER WAND RF STERILE (DRAPES) ×3 IMPLANT
DIFFUSER DRILL AIR PNEUMATIC (MISCELLANEOUS) ×3 IMPLANT
DRAPE LAPAROTOMY 100X72X124 (DRAPES) ×3 IMPLANT
DRAPE POUCH INSTRU U-SHP 10X18 (DRAPES) ×3 IMPLANT
DRESSING PREVENA PLUS CUSTOM (GAUZE/BANDAGES/DRESSINGS) ×2 IMPLANT
DRSG PREVENA PLUS CUSTOM (GAUZE/BANDAGES/DRESSINGS) ×6
ELECT REM PT RETURN 9FT ADLT (ELECTROSURGICAL) ×3
ELECTRODE REM PT RTRN 9FT ADLT (ELECTROSURGICAL) ×1 IMPLANT
GAUZE 4X4 16PLY RFD (DISPOSABLE) IMPLANT
GAUZE SPONGE 4X4 12PLY STRL (GAUZE/BANDAGES/DRESSINGS) IMPLANT
GLOVE BIO SURGEON STRL SZ7 (GLOVE) IMPLANT
GLOVE BIO SURGEON STRL SZ7.5 (GLOVE) ×3 IMPLANT
GLOVE BIOGEL PI IND STRL 7.0 (GLOVE) IMPLANT
GLOVE BIOGEL PI IND STRL 7.5 (GLOVE) ×2 IMPLANT
GLOVE BIOGEL PI INDICATOR 7.0 (GLOVE)
GLOVE BIOGEL PI INDICATOR 7.5 (GLOVE) ×4
GLOVE ECLIPSE 7.0 STRL STRAW (GLOVE) ×3 IMPLANT
GLOVE EXAM NITRILE XL STR (GLOVE) IMPLANT
GLOVE SURG SS PI 7.5 STRL IVOR (GLOVE) ×12 IMPLANT
GOWN STRL REUS W/ TWL LRG LVL3 (GOWN DISPOSABLE) ×3 IMPLANT
GOWN STRL REUS W/ TWL XL LVL3 (GOWN DISPOSABLE) IMPLANT
GOWN STRL REUS W/TWL 2XL LVL3 (GOWN DISPOSABLE) IMPLANT
GOWN STRL REUS W/TWL LRG LVL3 (GOWN DISPOSABLE) ×6
GOWN STRL REUS W/TWL XL LVL3 (GOWN DISPOSABLE)
HEMOSTAT POWDER KIT SURGIFOAM (HEMOSTASIS) ×3 IMPLANT
KIT BASIN OR (CUSTOM PROCEDURE TRAY) ×3 IMPLANT
KIT DRSG PREVENA PLUS 7DAY 125 (MISCELLANEOUS) ×3 IMPLANT
KIT TURNOVER KIT B (KITS) ×3 IMPLANT
NEEDLE HYPO 22GX1.5 SAFETY (NEEDLE) ×3 IMPLANT
NS IRRIG 1000ML POUR BTL (IV SOLUTION) ×3 IMPLANT
OIL CARTRIDGE MAESTRO DRILL (MISCELLANEOUS) ×3
PACK LAMINECTOMY NEURO (CUSTOM PROCEDURE TRAY) ×3 IMPLANT
PAD ARMBOARD 7.5X6 YLW CONV (MISCELLANEOUS) ×9 IMPLANT
SPONGE SURGIFOAM ABS GEL SZ50 (HEMOSTASIS) IMPLANT
SUT VIC AB 0 CT1 18XCR BRD8 (SUTURE) ×1 IMPLANT
SUT VIC AB 0 CT1 8-18 (SUTURE) ×2
SUT VICRYL 3-0 RB1 18 ABS (SUTURE) ×3 IMPLANT
SWAB COLLECTION DEVICE MRSA (MISCELLANEOUS) IMPLANT
SWAB CULTURE ESWAB REG 1ML (MISCELLANEOUS) IMPLANT
TOWEL GREEN STERILE (TOWEL DISPOSABLE) ×3 IMPLANT
TOWEL GREEN STERILE FF (TOWEL DISPOSABLE) ×3 IMPLANT
WATER STERILE IRR 1000ML POUR (IV SOLUTION) ×3 IMPLANT

## 2018-06-22 NOTE — Discharge Summary (Signed)
Physician Discharge Summary  Patient ID: ERYCA BOLTE MRN: [id number] DOB/AGE: Jun 03, 1941 77 y.o.  Admit date: 06/22/2018 Discharge date: 06/22/2018  Admission Diagnoses:  Chronic lumbar wound infection  Discharge Diagnoses:  Same Active Problems:   * No active hospital problems. *   Discharged Condition: Stable  Hospital Course:  Taylor Tyler is a 77 y.o. female who was brought in electively for placement of a wound VAC after previous attempt at primary closure was unsuccessful.  She underwent surgery without complication and was at baseline postoperatively.  Her wound VAC was maintaining adequate suction postoperatively.  Case managers were consulted and home wound nurse was set up for dressing changes at home.  She was therefore discharged in stable condition with appropriate outpatient follow-up.  Treatments: Surgery -wound debridement and application of wound VAC  Discharge Exam: Blood pressure (!) 154/60, pulse (!) 57, temperature (!) 97.3 F (36.3 C), resp. rate 16, height 5\' 2"  (1.575 m), weight 95.3 kg, SpO2 97 %. Awake, alert, oriented Speech fluent, appropriate CN grossly intact 5/5 BUE/BLE Wound c/d/i  Disposition: Discharge disposition: 01-Home or Self Care       Discharge Instructions    Call MD for:  redness, tenderness, or signs of infection (pain, swelling, redness, odor or green/yellow discharge around incision site)   Complete by:  As directed    Call MD for:  temperature >100.4   Complete by:  As directed    Consult to [place] Management   Complete by:  As directed    Reason for consult:  Wound Vac dressing changes   Diagnoses of:  Other   Other Diagnosis:  wound infection   Expected date of contact:  1-3 days (reserved for hospital discharges)   Diet - low sodium heart healthy   Complete by:  As directed    Discharge instructions   Complete by:  As directed    Walk at home as much as possible, at least 4 times / day   Face-to-face  encounter (required for Medicare/Medicaid patients)   Complete by:  As directed    I Jairo Ben certify that this patient is under my care and that I, or a nurse practitioner or physician's assistant working with me, had a face-to-face encounter that meets the physician face-to-face encounter requirements with this patient on 06/22/2018. The encounter with the patient was in whole, or in part for the following medical condition(s) which is the primary reason for home health care (List medical condition): chronic wound infection requiring long-term wound-vac therapy   The encounter with the patient was in whole, or in part, for the following medical condition, which is the primary reason for home health care:  wound infection   I certify that, based on my findings, the following services are medically necessary home health services:  Nursing   Reason for Medically Necessary Home Health Services:  Skilled Nursing- Post-Surgical Wound Assessment and Care   My clinical findings support the need for the above services:  OTHER SEE COMMENTS   Further, I certify that my clinical findings support that this patient is homebound due to:  Open/draining pressure/stasis ulcer   Home Health   Complete by:  As directed    Vac dressing changes M-W-F Can remove Provena on first visit and replace with [facility] wound VAC   To provide the following care/treatments:  RN   Increase activity slowly   Complete by:  As directed    Lifting restrictions   Complete by:  As directed    No lifting > 10 lbs   May shower / Bathe   Complete by:  As directed    48 hours after surgery   May walk up steps   Complete by:  As directed    Other Restrictions   Complete by:  As directed    No bending/twisting at waist     Allergies as of 06/22/2018      Reactions   Clams [shellfish Allergy] Swelling, Other (See Comments)   THROAT SWELLS NECK TURNS RED   Hydralazine Other (See Comments)   CHEST TIGHTNESS Patient has  tolerated multiple doses of hydralazine since allergy was listed    Norvasc [amlodipine Besylate] Swelling   Leg edema    Milk-related Compounds Diarrhea      Medication List    TAKE these medications   acetaminophen 500 MG tablet Commonly known as:  TYLENOL Take 1,000 mg by mouth 3 (three) times daily as needed for mild pain.   amLODipine 10 MG tablet Commonly known as:  NORVASC Take 1 tablet (10 mg total) by mouth daily.   amoxicillin 500 MG capsule Commonly known as:  AMOXIL Take 1 capsule (500 mg total) by mouth 3 (three) times daily for 11 days.   carvedilol 25 MG tablet Commonly known as:  COREG Take 1 tablet (25 mg total) by mouth 2 (two) times daily with a meal.   cholecalciferol 1000 units tablet Commonly known as:  VITAMIN D Take 1,000 Units by mouth daily.   cloNIDine 0.1 MG tablet Commonly known as:  CATAPRES Take 0.1 mg by mouth 2 (two) times daily.   diclofenac sodium 1 % Gel Commonly known as:  VOLTAREN Apply 4 g topically 4 (four) times daily as needed (knees).   docusate sodium 100 MG capsule Commonly known as:  COLACE Take 1 capsule (100 mg total) by mouth daily.   ferrous sulfate 325 (65 FE) MG tablet Take 325 mg by mouth daily with breakfast.   furosemide 40 MG tablet Commonly known as:  LASIX Take 40 mg by mouth 2 (two) times daily.   gabapentin 300 MG capsule Commonly known as:  NEURONTIN Take 300 mg by mouth at bedtime as needed (pain).   hydrALAZINE 50 MG tablet Commonly known as:  APRESOLINE Take 1 tablet (50 mg total) by mouth every 8 (eight) hours.   isosorbide mononitrate 30 MG 24 hr tablet Commonly known as:  IMDUR Take 1 tablet (30 mg total) by mouth daily.   levothyroxine 112 MCG tablet Commonly known as:  SYNTHROID, LEVOTHROID Take 112 mcg by mouth daily before breakfast.   magnesium oxide 400 MG tablet Commonly known as:  MAG-OX Take 400 mg by mouth daily.   senna 8.6 MG Tabs tablet Commonly known as:  SENOKOT Take  1 tablet (8.6 mg total) by mouth daily.   traMADol 50 MG tablet Commonly known as:  ULTRAM Take 1 tablet (50 mg total) by mouth every 12 (twelve) hours as needed.   vitamin C 500 MG tablet Commonly known as:  ASCORBIC ACID Take 500 mg by mouth daily.        SignedJairo Ben 06/22/2018, 1:56 PM

## 2018-06-22 NOTE — Progress Notes (Signed)
Completed [facility] wound VAC insurance auth forms and had MD sign; faxed forms/referral to [facility] per protocol.  [facility] to follow up with pt at home to apply [facility] VAC once delivered to pt's home, and perform dressing changes MWF.  Pt will be discharged today with Provena VAC in the interim.    Reinaldo Raddle, RN, BSN  Trauma/Neuro ICU Case Manager [phone]

## 2018-06-22 NOTE — Transfer of Care (Signed)
Immediate Anesthesia Transfer of Care Note  Patient: Taylor Tyler  Procedure(s) Performed: SIMPLE INCISION AND DRAINAGE OF WOUND, APPLICATION OF WOUND VAC (N/A Back)  Patient Location: PACU  Anesthesia Type:General  Level of Consciousness: awake, alert  and oriented  Airway & Oxygen Therapy: Patient Spontanous Breathing and Patient connected to nasal cannula oxygen  Post-op Assessment: Report given to RN and Post -op Vital signs reviewed and stable  Post vital signs: Reviewed and stable  Last Vitals:  Vitals Value Taken Time  BP 141/68 06/22/2018 11:58 AM  Temp    Pulse 66 06/22/2018 11:59 AM  Resp    SpO2 93 % 06/22/2018 11:59 AM  Vitals shown include unvalidated device data.  Last Pain:  Vitals:   06/22/18 0911  TempSrc:   PainSc: 7          Complications: No apparent anesthesia complications

## 2018-06-22 NOTE — Op Note (Signed)
  NEUROSURGERY OPERATIVE NOTE   PREOP DIAGNOSIS:  1. Chronic lumbar wound infection/dehiscence   POSTOP DIAGNOSIS: Same  PROCEDURE: 1. Lumbar wound debridement, application of wound vac  SURGEON: Dr. Consuella Lose, MD  ASSISTANT: Ferne Reus, PA-C  ANESTHESIA: General Endotracheal  EBL: Minimal  SPECIMENS: None  DRAINS: Pravena wound vac  COMPLICATIONS: None  CONDITION: Hemodynamically stable to PACU  HISTORY: Taylor Tyler is a 77 y.o. female who previously underwent lumbar fusion.  She has had chronic wound infection essentially since that surgery.  More recently, she underwent attempted wound debridement and primary closure about 2 and half weeks ago, but was seen in the outpatient neurosurgery clinic yesterday with dehiscence of the superior aspect of her wound and continued purulent drainage.  We therefore elected to proceed with wound washout and placement of a wound VAC in order to allow healing by secondary intention.  The risks and benefits of the surgery were explained in detail to the patient.  After all questions were answered informed consent was obtained and witnessed.  PROCEDURE IN DETAIL: The patient was brought to the operating room. After induction of general anesthesia, the patient was positioned on the operative table in the prone position. All pressure points were meticulously padded.  Previous skin incision was then marked out and prepped and draped in the usual sterile fashion.  After timeout was conducted, the previously placed Prolene stitches were removed.  The wound was then opened from the dehiscent portion towards the inferior margin of the wound.  The deep dermal Vicryl stitches were also cut and removed.  Immediately underneath this, we did identify a fair bit of purulent material which was suctioned out.  Again identified was a subcutaneous pocket on the patient's left side although it did appear to be somewhat smaller than the previous  operation.  No defect in the fascia was identified.  4 x 4 was used to debride the wound edges.  There did appear to be actually good bleeding granulation tissue at the bed of the wound as well as the edges of the incision.  At this point the wound was irrigated with copious amounts of antibiotic saline.  The wound VAC was then placed and connected to suction.  No leaks in the dressing were identified in the wound VAC suction was maintained well.  The patient was then transferred to the stretcher, extubated, and taken to the postanesthesia care unit in stable hemodynamic condition.  At the end of the case all sponge, needle, instrument, and cottonoid counts were correct.

## 2018-06-22 NOTE — Anesthesia Procedure Notes (Signed)
Procedure Name: Intubation Date/Time: 06/22/2018 10:29 AM Performed by: Eligha Bridegroom, CRNA Pre-anesthesia Checklist: Patient identified, Emergency Drugs available, Suction available, Patient being monitored and Timeout performed Patient Re-evaluated:Patient Re-evaluated prior to induction Oxygen Delivery Method: Circle system utilized Preoxygenation: Pre-oxygenation with 100% oxygen Induction Type: IV induction Ventilation: Mask ventilation without difficulty Laryngoscope Size: 3 and Mac Tube type: Oral Tube size: 7.0 mm Number of attempts: 1 Airway Equipment and Method: Stylet Placement Confirmation: ETT inserted through vocal cords under direct vision,  positive ETCO2 and breath sounds checked- equal and bilateral Secured at: 21 cm Tube secured with: Tape Dental Injury: Teeth and Oropharynx as per pre-operative assessment

## 2018-06-22 NOTE — Anesthesia Preprocedure Evaluation (Signed)
Anesthesia Evaluation  Patient identified by MRN, date of birth, ID band Patient awake    Reviewed: Allergy & Precautions, NPO status , Patient's Chart, lab work & pertinent test results  Airway Mallampati: II  TM Distance: >3 FB Neck ROM: Full    Dental  (+) Teeth Intact, Dental Advisory Given   Pulmonary    breath sounds clear to auscultation       Cardiovascular hypertension,  Rhythm:Regular Rate:Normal     Neuro/Psych    GI/Hepatic   Endo/Other  diabetes  Renal/GU      Musculoskeletal   Abdominal   Peds  Hematology   Anesthesia Other Findings   Reproductive/Obstetrics                             Anesthesia Physical Anesthesia Plan  ASA: III  Anesthesia Plan: General   Post-op Pain Management:    Induction: Intravenous  PONV Risk Score and Plan: Ondansetron  Airway Management Planned: Oral ETT  Additional Equipment:   Intra-op Plan:   Post-operative Plan: Extubation in OR  Informed Consent: I have reviewed the patients History and Physical, chart, labs and discussed the procedure including the risks, benefits and alternatives for the proposed anesthesia with the patient or authorized representative who has indicated his/her understanding and acceptance.   Dental advisory given  Plan Discussed with: CRNA and Anesthesiologist  Anesthesia Plan Comments:         Anesthesia Quick Evaluation  

## 2018-06-22 NOTE — Anesthesia Postprocedure Evaluation (Signed)
Anesthesia Post Note  Patient: Taylor Tyler  Procedure(s) Performed: SIMPLE INCISION AND DRAINAGE OF WOUND, APPLICATION OF WOUND VAC (N/A Back)     Patient location during evaluation: PACU Anesthesia Type: General Level of consciousness: awake and alert Pain management: pain level controlled Vital Signs Assessment: post-procedure vital signs reviewed and stable Respiratory status: spontaneous breathing, nonlabored ventilation, respiratory function stable and patient connected to nasal cannula oxygen Cardiovascular status: blood pressure returned to baseline and stable Postop Assessment: no apparent nausea or vomiting Anesthetic complications: no    Last Vitals:  Vitals:   06/22/18 1400 06/22/18 1415  BP: (!) 157/72 (!) 160/65  Pulse: (!) 59 65  Resp: 15 16  Temp:  (!) 36.3 C  SpO2: 99% 95%    Last Pain:  Vitals:   06/22/18 1415  TempSrc:   PainSc: 0-No pain                 Reni Hausner COKER

## 2018-06-23 ENCOUNTER — Ambulatory Visit: Payer: Self-pay | Admitting: Internal Medicine

## 2018-06-23 ENCOUNTER — Encounter (HOSPITAL_COMMUNITY): Payer: Self-pay | Admitting: Neurosurgery

## 2018-06-23 DIAGNOSIS — D631 Anemia in chronic kidney disease: Secondary | ICD-10-CM | POA: Diagnosis not present

## 2018-06-23 DIAGNOSIS — E039 Hypothyroidism, unspecified: Secondary | ICD-10-CM | POA: Diagnosis not present

## 2018-06-23 DIAGNOSIS — N183 Chronic kidney disease, stage 3 (moderate): Secondary | ICD-10-CM | POA: Diagnosis not present

## 2018-06-23 DIAGNOSIS — L02212 Cutaneous abscess of back [any part, except buttock]: Secondary | ICD-10-CM | POA: Diagnosis not present

## 2018-06-23 DIAGNOSIS — E1122 Type 2 diabetes mellitus with diabetic chronic kidney disease: Secondary | ICD-10-CM | POA: Diagnosis not present

## 2018-06-23 DIAGNOSIS — M1990 Unspecified osteoarthritis, unspecified site: Secondary | ICD-10-CM | POA: Diagnosis not present

## 2018-06-23 DIAGNOSIS — M48061 Spinal stenosis, lumbar region without neurogenic claudication: Secondary | ICD-10-CM | POA: Diagnosis not present

## 2018-06-23 DIAGNOSIS — K5730 Diverticulosis of large intestine without perforation or abscess without bleeding: Secondary | ICD-10-CM | POA: Diagnosis not present

## 2018-06-23 DIAGNOSIS — Z981 Arthrodesis status: Secondary | ICD-10-CM | POA: Diagnosis not present

## 2018-06-23 DIAGNOSIS — Z9181 History of falling: Secondary | ICD-10-CM | POA: Diagnosis not present

## 2018-06-23 DIAGNOSIS — Z792 Long term (current) use of antibiotics: Secondary | ICD-10-CM | POA: Diagnosis not present

## 2018-06-23 DIAGNOSIS — E785 Hyperlipidemia, unspecified: Secondary | ICD-10-CM | POA: Diagnosis not present

## 2018-06-23 DIAGNOSIS — I2510 Atherosclerotic heart disease of native coronary artery without angina pectoris: Secondary | ICD-10-CM | POA: Diagnosis not present

## 2018-06-23 DIAGNOSIS — T8141XA Infection following a procedure, superficial incisional surgical site, initial encounter: Secondary | ICD-10-CM | POA: Diagnosis not present

## 2018-06-23 DIAGNOSIS — I129 Hypertensive chronic kidney disease with stage 1 through stage 4 chronic kidney disease, or unspecified chronic kidney disease: Secondary | ICD-10-CM | POA: Diagnosis not present

## 2018-06-24 ENCOUNTER — Encounter: Payer: Self-pay | Admitting: *Deleted

## 2018-06-24 ENCOUNTER — Other Ambulatory Visit: Payer: Self-pay | Admitting: *Deleted

## 2018-06-24 DIAGNOSIS — Z792 Long term (current) use of antibiotics: Secondary | ICD-10-CM | POA: Diagnosis not present

## 2018-06-24 DIAGNOSIS — E785 Hyperlipidemia, unspecified: Secondary | ICD-10-CM | POA: Diagnosis not present

## 2018-06-24 DIAGNOSIS — M48061 Spinal stenosis, lumbar region without neurogenic claudication: Secondary | ICD-10-CM | POA: Diagnosis not present

## 2018-06-24 DIAGNOSIS — T8141XA Infection following a procedure, superficial incisional surgical site, initial encounter: Secondary | ICD-10-CM | POA: Diagnosis not present

## 2018-06-24 DIAGNOSIS — E039 Hypothyroidism, unspecified: Secondary | ICD-10-CM | POA: Diagnosis not present

## 2018-06-24 DIAGNOSIS — M1990 Unspecified osteoarthritis, unspecified site: Secondary | ICD-10-CM | POA: Diagnosis not present

## 2018-06-24 DIAGNOSIS — D631 Anemia in chronic kidney disease: Secondary | ICD-10-CM | POA: Diagnosis not present

## 2018-06-24 DIAGNOSIS — I129 Hypertensive chronic kidney disease with stage 1 through stage 4 chronic kidney disease, or unspecified chronic kidney disease: Secondary | ICD-10-CM | POA: Diagnosis not present

## 2018-06-24 DIAGNOSIS — E1122 Type 2 diabetes mellitus with diabetic chronic kidney disease: Secondary | ICD-10-CM | POA: Diagnosis not present

## 2018-06-24 DIAGNOSIS — I2510 Atherosclerotic heart disease of native coronary artery without angina pectoris: Secondary | ICD-10-CM | POA: Diagnosis not present

## 2018-06-24 DIAGNOSIS — L02212 Cutaneous abscess of back [any part, except buttock]: Secondary | ICD-10-CM | POA: Diagnosis not present

## 2018-06-24 DIAGNOSIS — K5730 Diverticulosis of large intestine without perforation or abscess without bleeding: Secondary | ICD-10-CM | POA: Diagnosis not present

## 2018-06-24 DIAGNOSIS — T8189XA Other complications of procedures, not elsewhere classified, initial encounter: Secondary | ICD-10-CM | POA: Diagnosis not present

## 2018-06-24 DIAGNOSIS — Z981 Arthrodesis status: Secondary | ICD-10-CM | POA: Diagnosis not present

## 2018-06-24 DIAGNOSIS — Z9181 History of falling: Secondary | ICD-10-CM | POA: Diagnosis not present

## 2018-06-24 DIAGNOSIS — N183 Chronic kidney disease, stage 3 (moderate): Secondary | ICD-10-CM | POA: Diagnosis not present

## 2018-06-24 NOTE — Patient Outreach (Signed)
[facility]) [place] Telephone Outreach PCP office completes Transition of Care follow up post-hospital discharge Post-hospital discharge day # 2  06/24/2018  Taylor Tyler 1941-05-04 [id number]  Successful telephone outreach to Taylor Tyler, 77 y/o female referred to [place] by inpatient RN CM after recent hospitalization June 22, 2018 for elective surgery for wound vac placement, as patient has had multiple recent post-operative wound infections post L5-S1 lumbar fusion in July 2019.  Patient was discharged home to self-care with home health [facility]) services through [place] for wound care/ dressing changes.  Patient has history including, but not limited to, HTN/ HLD; CKD- stage III; DM- Type II; hypothyroidism; diverticulosis; and morbid obesity.  HIPAA/ identity verified with patient during phone call today, and [place] services were discussed with patient.  Patient provides verbal consent for [facility] CM involvement in her care post-hospital discharge.  Today, patient reports that she is "doing just fine," and she reports "usual" chronic pain at "6-7/10" in her lower back, "where they did the surgery.  Patient denies new/ recent falls, and states that she is using her cane for ambulation, 'without problems."  Patient reports that she is expecting home health RN "any moment now," and states that she does not have long to talk to me today.  Patient sounds to be in no apparent distress throughout phone call today.  Patient further reports:  Medications: -- Has all medicationsand takes as prescribed;denies questions/ concerns about current medications.  -- self-manage medications- takes directly out of prescription bottle -- denies issues with swallowing medications -- declines medication review today, stating that her medications are not near her and she does not want to get up to retrieve for review; discussed  importance of prompt medication review post-hospital discharge; patient stated that she would review with me at time of next scheduled call  Home health [facility]) services: -- [facility] services for wound care/ wound vac RN in place through [facility]) -- confirms that [facility] services have begun; expecting nurse at "any moment;" confirms that she has contact information for [facility] team  Provider appointments: -- All upcoming provider appointments were reviewed with patient today-- patient was encouraged to promptly schedule post-hospital discharge appointments with her PCP and neurosurgeon; patient reports she will do  Safety/ Mobility/ Falls: -- denies new/ recent falls -- assistive devices: uses cane regularly -- general fall risks/ prevention education discussed with patient today  Social/ Community Resource needs: -- currently denies community resource needs, stating supportive family members that assist with care needs as indicated- patient has 2 local sons that she states are very involved in her ongoing care if needed -- patient or her family provides transportation for patient to all provider appointments, errands, etc- patient currently denies driving restrictions and reports that she usually drives herself to provider appointments/ errands, etc  Advanced Directive (AD) Planning:   --reports does not currently have exisisting AD in place and declines desire to create/ discuss today  Patient denies further issues, concerns, or problems today.  I provided/ confirmed that patient has my direct phone number, the main [facility] CM office phone number, and the [facility] CM 24-hour nurse advice phone number should issues arise prior to next scheduled [place] outreach.  Encouraged patient to contact me directly if needs, questions, issues, or concerns arise prior to next scheduled outreach; patient agreed to do so.  Plan:  Patient will take medications as prescribed and will  attend all scheduled  provider appointments  Patient will promptly schedule post-hospital discharge office visit with PCP and neurosurgeon  Patient will promptly notify care providers for any new concerns/ issues/ problems that arise  Patient will actively participate in home health services as ordered post-hospital discharge  I will make patien't PCP aware of [place] RN CM involvement in patient's care-- will send barriers letter  [place] CM outreach to continue with scheduled phone call within 2 weeks  [facility] CM Care Plan Problem One     Most Recent Value  Care Plan Problem One  Risk for hospital re-admission related to/ evidenced by recent hospitalizations for post-operative wound infection    Role Documenting the Problem One  Care Management Coordinator  Care Plan for Problem One  Active  [facility] Long Term Goal   Over the next 31 days, patient will not experience unplanned hospital readmission, as evidenced by patient reporting and review of EMR during [place] outreach  [place] Term Goal Start Date  06/24/18  Interventions for Problem One Long Term Goal  Discussed with patient current clinical condition,  confirmed that patient does not have concerns around post-hospital discharge condition,  discussed need for prompt medication review,  initiated [place] program  [facility] CM Short Term Goal #1   Over the next 14 days, patient will schedule post-hospital PCP and neurosurgery office visits, as evidenced by patient reporting and collaboration with care providers as indicated during [place] outreach  [facility] CM Short Term Goal #1 Start Date  06/24/18  Interventions for Short Term Goal #1  Discussed with patient value of scheduling prompt post-hospital discharge provider appointments,  encouraged and advised patient to promptly schedule both  [facility] CM Short Term Goal #2   Over the next 30 days, patient will actively participate in home health services as ordered post-hospital discharge, as evidenced by  patient reporting and collaboration with home health team as indicated during [place] CCM outreach  [facility] CM Short Term Goal #2 Start Date  06/24/18  Interventions for Short Term Goal #2  Confirmed that patient has scheduled home health RN/ wound care home visit scheduled for today,  confirmed that patient has no concerns/ issues around wound vac in place,  confirmed that patient has contact information for home health agency     I appreciate the opportunity to participate in Ms. Kong's care,  Oneta Rack, RN, BSN, [employer] Coordinator [facility] Care Management  [phone]

## 2018-06-25 DIAGNOSIS — E1122 Type 2 diabetes mellitus with diabetic chronic kidney disease: Secondary | ICD-10-CM | POA: Diagnosis not present

## 2018-06-25 DIAGNOSIS — N183 Chronic kidney disease, stage 3 (moderate): Secondary | ICD-10-CM | POA: Diagnosis not present

## 2018-06-25 DIAGNOSIS — K5730 Diverticulosis of large intestine without perforation or abscess without bleeding: Secondary | ICD-10-CM | POA: Diagnosis not present

## 2018-06-25 DIAGNOSIS — E785 Hyperlipidemia, unspecified: Secondary | ICD-10-CM | POA: Diagnosis not present

## 2018-06-25 DIAGNOSIS — D631 Anemia in chronic kidney disease: Secondary | ICD-10-CM | POA: Diagnosis not present

## 2018-06-25 DIAGNOSIS — Z792 Long term (current) use of antibiotics: Secondary | ICD-10-CM | POA: Diagnosis not present

## 2018-06-25 DIAGNOSIS — Z9181 History of falling: Secondary | ICD-10-CM | POA: Diagnosis not present

## 2018-06-25 DIAGNOSIS — M1990 Unspecified osteoarthritis, unspecified site: Secondary | ICD-10-CM | POA: Diagnosis not present

## 2018-06-25 DIAGNOSIS — I129 Hypertensive chronic kidney disease with stage 1 through stage 4 chronic kidney disease, or unspecified chronic kidney disease: Secondary | ICD-10-CM | POA: Diagnosis not present

## 2018-06-25 DIAGNOSIS — E039 Hypothyroidism, unspecified: Secondary | ICD-10-CM | POA: Diagnosis not present

## 2018-06-25 DIAGNOSIS — I2510 Atherosclerotic heart disease of native coronary artery without angina pectoris: Secondary | ICD-10-CM | POA: Diagnosis not present

## 2018-06-25 DIAGNOSIS — Z981 Arthrodesis status: Secondary | ICD-10-CM | POA: Diagnosis not present

## 2018-06-25 DIAGNOSIS — M48061 Spinal stenosis, lumbar region without neurogenic claudication: Secondary | ICD-10-CM | POA: Diagnosis not present

## 2018-06-25 DIAGNOSIS — T8141XA Infection following a procedure, superficial incisional surgical site, initial encounter: Secondary | ICD-10-CM | POA: Diagnosis not present

## 2018-06-25 DIAGNOSIS — L02212 Cutaneous abscess of back [any part, except buttock]: Secondary | ICD-10-CM | POA: Diagnosis not present

## 2018-06-28 DIAGNOSIS — E785 Hyperlipidemia, unspecified: Secondary | ICD-10-CM | POA: Diagnosis not present

## 2018-06-28 DIAGNOSIS — M48061 Spinal stenosis, lumbar region without neurogenic claudication: Secondary | ICD-10-CM | POA: Diagnosis not present

## 2018-06-28 DIAGNOSIS — K5730 Diverticulosis of large intestine without perforation or abscess without bleeding: Secondary | ICD-10-CM | POA: Diagnosis not present

## 2018-06-28 DIAGNOSIS — T8141XA Infection following a procedure, superficial incisional surgical site, initial encounter: Secondary | ICD-10-CM | POA: Diagnosis not present

## 2018-06-28 DIAGNOSIS — I2510 Atherosclerotic heart disease of native coronary artery without angina pectoris: Secondary | ICD-10-CM | POA: Diagnosis not present

## 2018-06-28 DIAGNOSIS — D631 Anemia in chronic kidney disease: Secondary | ICD-10-CM | POA: Diagnosis not present

## 2018-06-28 DIAGNOSIS — L02212 Cutaneous abscess of back [any part, except buttock]: Secondary | ICD-10-CM | POA: Diagnosis not present

## 2018-06-28 DIAGNOSIS — Z792 Long term (current) use of antibiotics: Secondary | ICD-10-CM | POA: Diagnosis not present

## 2018-06-28 DIAGNOSIS — I129 Hypertensive chronic kidney disease with stage 1 through stage 4 chronic kidney disease, or unspecified chronic kidney disease: Secondary | ICD-10-CM | POA: Diagnosis not present

## 2018-06-28 DIAGNOSIS — E1122 Type 2 diabetes mellitus with diabetic chronic kidney disease: Secondary | ICD-10-CM | POA: Diagnosis not present

## 2018-06-28 DIAGNOSIS — E039 Hypothyroidism, unspecified: Secondary | ICD-10-CM | POA: Diagnosis not present

## 2018-06-28 DIAGNOSIS — N183 Chronic kidney disease, stage 3 (moderate): Secondary | ICD-10-CM | POA: Diagnosis not present

## 2018-06-28 DIAGNOSIS — M1990 Unspecified osteoarthritis, unspecified site: Secondary | ICD-10-CM | POA: Diagnosis not present

## 2018-06-28 DIAGNOSIS — Z9181 History of falling: Secondary | ICD-10-CM | POA: Diagnosis not present

## 2018-06-28 DIAGNOSIS — Z981 Arthrodesis status: Secondary | ICD-10-CM | POA: Diagnosis not present

## 2018-06-30 DIAGNOSIS — D631 Anemia in chronic kidney disease: Secondary | ICD-10-CM | POA: Diagnosis not present

## 2018-06-30 DIAGNOSIS — Z792 Long term (current) use of antibiotics: Secondary | ICD-10-CM | POA: Diagnosis not present

## 2018-06-30 DIAGNOSIS — Z9181 History of falling: Secondary | ICD-10-CM | POA: Diagnosis not present

## 2018-06-30 DIAGNOSIS — Z981 Arthrodesis status: Secondary | ICD-10-CM | POA: Diagnosis not present

## 2018-06-30 DIAGNOSIS — E039 Hypothyroidism, unspecified: Secondary | ICD-10-CM | POA: Diagnosis not present

## 2018-06-30 DIAGNOSIS — I2510 Atherosclerotic heart disease of native coronary artery without angina pectoris: Secondary | ICD-10-CM | POA: Diagnosis not present

## 2018-06-30 DIAGNOSIS — K5730 Diverticulosis of large intestine without perforation or abscess without bleeding: Secondary | ICD-10-CM | POA: Diagnosis not present

## 2018-06-30 DIAGNOSIS — E1122 Type 2 diabetes mellitus with diabetic chronic kidney disease: Secondary | ICD-10-CM | POA: Diagnosis not present

## 2018-06-30 DIAGNOSIS — T8141XA Infection following a procedure, superficial incisional surgical site, initial encounter: Secondary | ICD-10-CM | POA: Diagnosis not present

## 2018-06-30 DIAGNOSIS — I129 Hypertensive chronic kidney disease with stage 1 through stage 4 chronic kidney disease, or unspecified chronic kidney disease: Secondary | ICD-10-CM | POA: Diagnosis not present

## 2018-06-30 DIAGNOSIS — N183 Chronic kidney disease, stage 3 (moderate): Secondary | ICD-10-CM | POA: Diagnosis not present

## 2018-06-30 DIAGNOSIS — L02212 Cutaneous abscess of back [any part, except buttock]: Secondary | ICD-10-CM | POA: Diagnosis not present

## 2018-06-30 DIAGNOSIS — E785 Hyperlipidemia, unspecified: Secondary | ICD-10-CM | POA: Diagnosis not present

## 2018-06-30 DIAGNOSIS — M1990 Unspecified osteoarthritis, unspecified site: Secondary | ICD-10-CM | POA: Diagnosis not present

## 2018-06-30 DIAGNOSIS — M48061 Spinal stenosis, lumbar region without neurogenic claudication: Secondary | ICD-10-CM | POA: Diagnosis not present

## 2018-07-02 ENCOUNTER — Other Ambulatory Visit: Payer: Self-pay

## 2018-07-02 DIAGNOSIS — M1990 Unspecified osteoarthritis, unspecified site: Secondary | ICD-10-CM | POA: Diagnosis not present

## 2018-07-02 DIAGNOSIS — E785 Hyperlipidemia, unspecified: Secondary | ICD-10-CM | POA: Diagnosis not present

## 2018-07-02 DIAGNOSIS — D631 Anemia in chronic kidney disease: Secondary | ICD-10-CM | POA: Diagnosis not present

## 2018-07-02 DIAGNOSIS — Z981 Arthrodesis status: Secondary | ICD-10-CM | POA: Diagnosis not present

## 2018-07-02 DIAGNOSIS — E039 Hypothyroidism, unspecified: Secondary | ICD-10-CM | POA: Diagnosis not present

## 2018-07-02 DIAGNOSIS — N183 Chronic kidney disease, stage 3 (moderate): Secondary | ICD-10-CM | POA: Diagnosis not present

## 2018-07-02 DIAGNOSIS — K5730 Diverticulosis of large intestine without perforation or abscess without bleeding: Secondary | ICD-10-CM | POA: Diagnosis not present

## 2018-07-02 DIAGNOSIS — I129 Hypertensive chronic kidney disease with stage 1 through stage 4 chronic kidney disease, or unspecified chronic kidney disease: Secondary | ICD-10-CM | POA: Diagnosis not present

## 2018-07-02 DIAGNOSIS — Z792 Long term (current) use of antibiotics: Secondary | ICD-10-CM | POA: Diagnosis not present

## 2018-07-02 DIAGNOSIS — I2510 Atherosclerotic heart disease of native coronary artery without angina pectoris: Secondary | ICD-10-CM | POA: Diagnosis not present

## 2018-07-02 DIAGNOSIS — M48061 Spinal stenosis, lumbar region without neurogenic claudication: Secondary | ICD-10-CM | POA: Diagnosis not present

## 2018-07-02 DIAGNOSIS — E1122 Type 2 diabetes mellitus with diabetic chronic kidney disease: Secondary | ICD-10-CM | POA: Diagnosis not present

## 2018-07-02 DIAGNOSIS — T8141XA Infection following a procedure, superficial incisional surgical site, initial encounter: Secondary | ICD-10-CM | POA: Diagnosis not present

## 2018-07-02 DIAGNOSIS — Z9181 History of falling: Secondary | ICD-10-CM | POA: Diagnosis not present

## 2018-07-02 DIAGNOSIS — L02212 Cutaneous abscess of back [any part, except buttock]: Secondary | ICD-10-CM | POA: Diagnosis not present

## 2018-07-02 NOTE — Patient Outreach (Signed)
[place]) Care Management  07/02/2018  KORALEE WEDEKING 04-24-1941 [id number]   Medication Adherence call To Mrs. [name] spoke with patient she is no longer taking Atorvastatin 40 mg patient is due on this medication ,patient said doctor took her off. Mrs. Raigoza is showing past due under [place].   [place] Management Direct Dial [phone]  Fax [phone] [email or web address]

## 2018-07-04 DIAGNOSIS — T8189XA Other complications of procedures, not elsewhere classified, initial encounter: Secondary | ICD-10-CM | POA: Diagnosis not present

## 2018-07-05 ENCOUNTER — Encounter: Payer: Self-pay | Admitting: *Deleted

## 2018-07-05 ENCOUNTER — Other Ambulatory Visit: Payer: Self-pay | Admitting: *Deleted

## 2018-07-05 DIAGNOSIS — I2510 Atherosclerotic heart disease of native coronary artery without angina pectoris: Secondary | ICD-10-CM | POA: Diagnosis not present

## 2018-07-05 DIAGNOSIS — M48061 Spinal stenosis, lumbar region without neurogenic claudication: Secondary | ICD-10-CM | POA: Diagnosis not present

## 2018-07-05 DIAGNOSIS — E785 Hyperlipidemia, unspecified: Secondary | ICD-10-CM | POA: Diagnosis not present

## 2018-07-05 DIAGNOSIS — M1990 Unspecified osteoarthritis, unspecified site: Secondary | ICD-10-CM | POA: Diagnosis not present

## 2018-07-05 DIAGNOSIS — D631 Anemia in chronic kidney disease: Secondary | ICD-10-CM | POA: Diagnosis not present

## 2018-07-05 DIAGNOSIS — N183 Chronic kidney disease, stage 3 (moderate): Secondary | ICD-10-CM | POA: Diagnosis not present

## 2018-07-05 DIAGNOSIS — Z792 Long term (current) use of antibiotics: Secondary | ICD-10-CM | POA: Diagnosis not present

## 2018-07-05 DIAGNOSIS — T8141XA Infection following a procedure, superficial incisional surgical site, initial encounter: Secondary | ICD-10-CM | POA: Diagnosis not present

## 2018-07-05 DIAGNOSIS — E039 Hypothyroidism, unspecified: Secondary | ICD-10-CM | POA: Diagnosis not present

## 2018-07-05 DIAGNOSIS — E1122 Type 2 diabetes mellitus with diabetic chronic kidney disease: Secondary | ICD-10-CM | POA: Diagnosis not present

## 2018-07-05 DIAGNOSIS — Z9181 History of falling: Secondary | ICD-10-CM | POA: Diagnosis not present

## 2018-07-05 DIAGNOSIS — Z981 Arthrodesis status: Secondary | ICD-10-CM | POA: Diagnosis not present

## 2018-07-05 DIAGNOSIS — L02212 Cutaneous abscess of back [any part, except buttock]: Secondary | ICD-10-CM | POA: Diagnosis not present

## 2018-07-05 DIAGNOSIS — K5730 Diverticulosis of large intestine without perforation or abscess without bleeding: Secondary | ICD-10-CM | POA: Diagnosis not present

## 2018-07-05 DIAGNOSIS — I129 Hypertensive chronic kidney disease with stage 1 through stage 4 chronic kidney disease, or unspecified chronic kidney disease: Secondary | ICD-10-CM | POA: Diagnosis not present

## 2018-07-05 NOTE — Patient Outreach (Signed)
[name]) [place] Telephone Outreach PCP office completes Transition of Care follow up post-hospital discharge Post-hospital discharge day # 13  07/05/2018  Taylor Tyler 1941-04-06 [id number]  Successful telephone outreach to Taylor Tyler, 77 y/o female referred to [place] by inpatient RN CM after recent hospitalization June 22, 2018 for elective surgery for wound vac placement, as patient has had multiple recent post-operative wound infections post L5-S1 lumbar fusion in July 2019.  Patient was discharged home to self-care with home health [facility]) services through [place] for wound care/ dressing changes.  Patient has history including, but not limited to, HTN/ HLD; CKD- stage III; DM- Type II; hypothyroidism; diverticulosis; and morbid obesity.  HIPAA/ identity verified with patient during phone call today.  Today, patient reports that she is "doing fine, about the same as always" and she reports "usual" chronic pain at "3/10" in her lower back; states taking tylenol "helps."  Patient reports that she is currently visiting with home heath nurse at her home.  Patient sounds to be in no apparent distress throughout phone call today.  Patient further reports:  -- denies issues/ concerns/ problems around medications: states that she has all medications and is taking as prescribed; again declines medication review, as [place] team is currently visiting with her.  Discussed importance of prompt medication review post- hospital discharge- patient agrees today to complete this at time of next phone [place] phone call  -- continues to deny community resource needs- states her sons continue assisting with care needs as indicated  -- has not yet scheduled post-hospital office visits with PCP or neurosurgery- patient was again encouraged to schedule these visits promptly  Patient denies further issues, concerns, or problems today. I  confirmed that patient hasmy direct phone number, the main [facility] CM office phone number, and the [facility] CM 24-hour nurse advice phone number should issues arise prior to next scheduled [place] outreach.  Encouraged patient to contact me directly if needs, questions, issues, or concerns arise prior to next scheduled outreach; patient agreed to do so.  Plan:  Patient will take medications as prescribed and will attend all scheduled provider appointments  Patient will promptly schedule post-hospital discharge office visit with PCP and neurosurgeon  Patient will promptly notify care providers for any new concerns/ issues/ problems that arise  Patient will actively participate in home health services as ordered post-hospital discharge  [place] outreach to continue with scheduled phone call within 2 weeks  [facility] CM Care Plan Problem One     Most Recent Value  Care Plan Problem One  Risk for hospital re-admission related to/ evidenced by recent hospitalizations for post-operative wound infection    Role Documenting the Problem One  Care Management Coordinator  Care Plan for Problem One  Active  THN Long Term Goal   Over the next 31 days, patient will not experience unplanned hospital readmission, as evidenced by patient reporting and review of EMR during [place] outreach  [place] Term Goal Start Date  06/24/18  Interventions for Problem One Long Term Goal  Discussed current clinical condition with patient and confirmed that she has no clinical concerns  THN CM Short Term Goal #1   Over the next 14 days, patient will schedule post-hospital PCP and neurosurgery office visits, as evidenced by patient reporting and collaboration with care providers as indicated during [place] outreach [Goal re-established today]  [facility] CM Short Term  Goal #1 Start Date  07/05/18 [Goal re-established today]  Interventions for Short Term Goal #1  Noted that patient has not yet had PCP or post-surgeon  office visit:  again encouraged patient to promptly schedule these appointments,  goal re-established  [facility] CM Short Term Goal #2   Over the next 30 days, patient will actively participate in home health services as ordered post-hospital discharge, as evidenced by patient reporting and collaboration with home health team as indicated during [place] outreach  [facility] CM Short Term Goal #2 Start Date  06/24/18  Interventions for Short Term Goal #2  Discussed recent home health visits with patient, who confirms that [facility] team is currently visiting with her in her home,  positive reinforcement provided     Oneta Rack, RN, BSN, [place] Coordinator [facility] Care Management  [phone]

## 2018-07-07 DIAGNOSIS — E1122 Type 2 diabetes mellitus with diabetic chronic kidney disease: Secondary | ICD-10-CM | POA: Diagnosis not present

## 2018-07-07 DIAGNOSIS — I129 Hypertensive chronic kidney disease with stage 1 through stage 4 chronic kidney disease, or unspecified chronic kidney disease: Secondary | ICD-10-CM | POA: Diagnosis not present

## 2018-07-07 DIAGNOSIS — T8141XA Infection following a procedure, superficial incisional surgical site, initial encounter: Secondary | ICD-10-CM | POA: Diagnosis not present

## 2018-07-07 DIAGNOSIS — E039 Hypothyroidism, unspecified: Secondary | ICD-10-CM | POA: Diagnosis not present

## 2018-07-07 DIAGNOSIS — E785 Hyperlipidemia, unspecified: Secondary | ICD-10-CM | POA: Diagnosis not present

## 2018-07-07 DIAGNOSIS — D631 Anemia in chronic kidney disease: Secondary | ICD-10-CM | POA: Diagnosis not present

## 2018-07-07 DIAGNOSIS — Z792 Long term (current) use of antibiotics: Secondary | ICD-10-CM | POA: Diagnosis not present

## 2018-07-07 DIAGNOSIS — N183 Chronic kidney disease, stage 3 (moderate): Secondary | ICD-10-CM | POA: Diagnosis not present

## 2018-07-07 DIAGNOSIS — L02212 Cutaneous abscess of back [any part, except buttock]: Secondary | ICD-10-CM | POA: Diagnosis not present

## 2018-07-07 DIAGNOSIS — K5730 Diverticulosis of large intestine without perforation or abscess without bleeding: Secondary | ICD-10-CM | POA: Diagnosis not present

## 2018-07-07 DIAGNOSIS — Z9181 History of falling: Secondary | ICD-10-CM | POA: Diagnosis not present

## 2018-07-07 DIAGNOSIS — M48061 Spinal stenosis, lumbar region without neurogenic claudication: Secondary | ICD-10-CM | POA: Diagnosis not present

## 2018-07-07 DIAGNOSIS — M1990 Unspecified osteoarthritis, unspecified site: Secondary | ICD-10-CM | POA: Diagnosis not present

## 2018-07-07 DIAGNOSIS — I2510 Atherosclerotic heart disease of native coronary artery without angina pectoris: Secondary | ICD-10-CM | POA: Diagnosis not present

## 2018-07-07 DIAGNOSIS — Z981 Arthrodesis status: Secondary | ICD-10-CM | POA: Diagnosis not present

## 2018-07-09 ENCOUNTER — Telehealth: Payer: Self-pay | Admitting: Internal Medicine

## 2018-07-09 DIAGNOSIS — I2510 Atherosclerotic heart disease of native coronary artery without angina pectoris: Secondary | ICD-10-CM | POA: Diagnosis not present

## 2018-07-09 DIAGNOSIS — Z981 Arthrodesis status: Secondary | ICD-10-CM | POA: Diagnosis not present

## 2018-07-09 DIAGNOSIS — Z792 Long term (current) use of antibiotics: Secondary | ICD-10-CM | POA: Diagnosis not present

## 2018-07-09 DIAGNOSIS — L02212 Cutaneous abscess of back [any part, except buttock]: Secondary | ICD-10-CM | POA: Diagnosis not present

## 2018-07-09 DIAGNOSIS — K5730 Diverticulosis of large intestine without perforation or abscess without bleeding: Secondary | ICD-10-CM | POA: Diagnosis not present

## 2018-07-09 DIAGNOSIS — M1990 Unspecified osteoarthritis, unspecified site: Secondary | ICD-10-CM | POA: Diagnosis not present

## 2018-07-09 DIAGNOSIS — M48061 Spinal stenosis, lumbar region without neurogenic claudication: Secondary | ICD-10-CM | POA: Diagnosis not present

## 2018-07-09 DIAGNOSIS — E039 Hypothyroidism, unspecified: Secondary | ICD-10-CM | POA: Diagnosis not present

## 2018-07-09 DIAGNOSIS — D631 Anemia in chronic kidney disease: Secondary | ICD-10-CM | POA: Diagnosis not present

## 2018-07-09 DIAGNOSIS — N183 Chronic kidney disease, stage 3 (moderate): Secondary | ICD-10-CM | POA: Diagnosis not present

## 2018-07-09 DIAGNOSIS — I129 Hypertensive chronic kidney disease with stage 1 through stage 4 chronic kidney disease, or unspecified chronic kidney disease: Secondary | ICD-10-CM | POA: Diagnosis not present

## 2018-07-09 DIAGNOSIS — T8141XA Infection following a procedure, superficial incisional surgical site, initial encounter: Secondary | ICD-10-CM | POA: Diagnosis not present

## 2018-07-09 DIAGNOSIS — E1122 Type 2 diabetes mellitus with diabetic chronic kidney disease: Secondary | ICD-10-CM | POA: Diagnosis not present

## 2018-07-09 DIAGNOSIS — Z9181 History of falling: Secondary | ICD-10-CM | POA: Diagnosis not present

## 2018-07-09 DIAGNOSIS — E785 Hyperlipidemia, unspecified: Secondary | ICD-10-CM | POA: Diagnosis not present

## 2018-07-09 NOTE — Telephone Encounter (Signed)
Inform pt losartan 100 mg qd was stopped 06/12/18 hospital discharge  Kidney doctors should decide if this should be continued   [place] -fax this note and refill request to [facility] in [place] -fax to pharmacy refill request

## 2018-07-09 NOTE — Telephone Encounter (Signed)
Patient has been informed.

## 2018-07-12 DIAGNOSIS — E1122 Type 2 diabetes mellitus with diabetic chronic kidney disease: Secondary | ICD-10-CM | POA: Diagnosis not present

## 2018-07-12 DIAGNOSIS — K5730 Diverticulosis of large intestine without perforation or abscess without bleeding: Secondary | ICD-10-CM | POA: Diagnosis not present

## 2018-07-12 DIAGNOSIS — Z981 Arthrodesis status: Secondary | ICD-10-CM | POA: Diagnosis not present

## 2018-07-12 DIAGNOSIS — L02212 Cutaneous abscess of back [any part, except buttock]: Secondary | ICD-10-CM | POA: Diagnosis not present

## 2018-07-12 DIAGNOSIS — D631 Anemia in chronic kidney disease: Secondary | ICD-10-CM | POA: Diagnosis not present

## 2018-07-12 DIAGNOSIS — M1990 Unspecified osteoarthritis, unspecified site: Secondary | ICD-10-CM | POA: Diagnosis not present

## 2018-07-12 DIAGNOSIS — E785 Hyperlipidemia, unspecified: Secondary | ICD-10-CM | POA: Diagnosis not present

## 2018-07-12 DIAGNOSIS — I129 Hypertensive chronic kidney disease with stage 1 through stage 4 chronic kidney disease, or unspecified chronic kidney disease: Secondary | ICD-10-CM | POA: Diagnosis not present

## 2018-07-12 DIAGNOSIS — N183 Chronic kidney disease, stage 3 (moderate): Secondary | ICD-10-CM | POA: Diagnosis not present

## 2018-07-12 DIAGNOSIS — Z9181 History of falling: Secondary | ICD-10-CM | POA: Diagnosis not present

## 2018-07-12 DIAGNOSIS — M48061 Spinal stenosis, lumbar region without neurogenic claudication: Secondary | ICD-10-CM | POA: Diagnosis not present

## 2018-07-12 DIAGNOSIS — T8141XA Infection following a procedure, superficial incisional surgical site, initial encounter: Secondary | ICD-10-CM | POA: Diagnosis not present

## 2018-07-12 DIAGNOSIS — E039 Hypothyroidism, unspecified: Secondary | ICD-10-CM | POA: Diagnosis not present

## 2018-07-12 DIAGNOSIS — I2510 Atherosclerotic heart disease of native coronary artery without angina pectoris: Secondary | ICD-10-CM | POA: Diagnosis not present

## 2018-07-12 DIAGNOSIS — Z792 Long term (current) use of antibiotics: Secondary | ICD-10-CM | POA: Diagnosis not present

## 2018-07-14 ENCOUNTER — Encounter: Payer: Self-pay | Admitting: *Deleted

## 2018-07-14 ENCOUNTER — Other Ambulatory Visit: Payer: Self-pay | Admitting: *Deleted

## 2018-07-14 NOTE — Patient Outreach (Signed)
[place] [facility]) [place] Telephone Outreach PCP office completes Transition of Care follow up post-hospital discharge Post-hospital discharge day # 22  07/14/2018  Taylor Tyler [birth year] [id number]  Successful telephone outreachto Taylor Tyler, 77 y/o female referred to [place] by inpatient RN CM after recent hospitalization June 22, 2018 for elective surgery for wound vac placement, as patient has had multiple recent post-operative wound infections post L5-S1 lumbar fusion in July 2019. Patient was discharged home to self-care with home health [facility]) services through [place] for wound care/ dressing changes. Patient has history including, but not limited to, HTN/ HLD; CKD- stage III; DM- Type II; hypothyroidism; diverticulosis; and morbid obesity. HIPAA/ identity verified with patient during phone call today.  Today, patient reports that she is "doing very well" and today she denies pain and new/ recent falls.  Patient reports that she is currently visiting with family members at her home, and does not have time to talk to me for very long.  Patient sounds to be in no apparent distress throughout phone call today.  Patient further reports:  -- denies issues/ concerns/ problems around medications: states that she has all medications and is taking as prescribed; again declines medication review, as her family is currently visiting with her.  Discussed importance of prompt medication review post- hospital discharge- patient again agrees today to complete this at time of next phone [place] phone call  -- continues to deny community resource needs- states her sons continue assisting with care needs as indicated  -- has scheduled post-hospital office visit with PCP for "sometime in January," stating she can not remember the exact date of scheduled visit.  Patient reports that she attended post-hospital visit with neurosurgery  provider this morning.  States "got a good report," and denies that changes were made to overall plan of care post office visit today.    Patient denies further issues, concerns, or problems today. Iconfirmed that patient hasmy direct phone number, the main THN CM office phone number, and the [facility] CM 24-hour nurse advice phone number should issues arise prior to next scheduled [place] outreach in 2 weeks by phone. Encouraged patient to contact me directly if needs, questions, issues, or concerns arise prior to next scheduled outreach; patient agreed to do so.  Plan:  Patient will take medications as prescribed and will attend all scheduled provider appointments  Patient will promptly notify care providers for any new concerns/ issues/ problems that arise  Patient will actively participate in home health services as ordered post-hospital discharge  [place] outreach to continue with scheduled phone callwithin 2weeks  [facility] CM Care Plan Problem One     Most Recent Value  Care Plan Problem One  Risk for hospital re-admission related to/ evidenced by recent hospitalizations for post-operative wound infection    Role Documenting the Problem One  Care Management Coordinator  Care Plan for Problem One  Active  THN Long Term Goal   Over the next 31 days, patient will not experience unplanned hospital readmission, as evidenced by patient reporting and review of EMR during [place] outreach  [place] Term Goal Start Date  06/24/18  Interventions for Problem One Long Term Goal  Discussed with patient current clinical condition and confirmed that she does not have concerns aorund her current condition,  encouraged patient to complete post-hospital medication review with [facility] RN CCM at time of next outreach,  confirmed that  patient does not have concerns/ questions around her current medications  THN CM Short Term Goal #1   Over the next 14 days, patient will schedule  post-hospital PCP and neurosurgery office visits, as evidenced by patient reporting and collaboration with care providers as indicated during [facility] CCM outreach  [facility] CM Short Term Goal #1 Start Date  07/05/18  [facility] CM Short Term Goal #1 Met Date  07/14/18 - Goal Met  Interventions for Short Term Goal #1  Discussed today's neurosurgery provider office visit with patient and confirmed that she has also scheduled a PCP appointment  [facility] CM Short Term Goal #2   Over the next 30 days, patient will actively participate in home health services as ordered post-hospital discharge, as evidenced by patient reporting and collaboration with home health team as indicated during [place] outreach  [facility] CM Short Term Goal #2 Start Date  06/24/18  Interventions for Short Term Goal #2  Confirmed that home health services remain in place and that patient continues actively participating in home health services     Oneta Rack, RN, BSN, [employer] Coordinator [facility] Care Management  [phone]

## 2018-07-16 DIAGNOSIS — D631 Anemia in chronic kidney disease: Secondary | ICD-10-CM | POA: Diagnosis not present

## 2018-07-16 DIAGNOSIS — N183 Chronic kidney disease, stage 3 (moderate): Secondary | ICD-10-CM | POA: Diagnosis not present

## 2018-07-16 DIAGNOSIS — K5730 Diverticulosis of large intestine without perforation or abscess without bleeding: Secondary | ICD-10-CM | POA: Diagnosis not present

## 2018-07-16 DIAGNOSIS — E1122 Type 2 diabetes mellitus with diabetic chronic kidney disease: Secondary | ICD-10-CM | POA: Diagnosis not present

## 2018-07-16 DIAGNOSIS — T8141XA Infection following a procedure, superficial incisional surgical site, initial encounter: Secondary | ICD-10-CM | POA: Diagnosis not present

## 2018-07-16 DIAGNOSIS — E785 Hyperlipidemia, unspecified: Secondary | ICD-10-CM | POA: Diagnosis not present

## 2018-07-16 DIAGNOSIS — I129 Hypertensive chronic kidney disease with stage 1 through stage 4 chronic kidney disease, or unspecified chronic kidney disease: Secondary | ICD-10-CM | POA: Diagnosis not present

## 2018-07-16 DIAGNOSIS — M1990 Unspecified osteoarthritis, unspecified site: Secondary | ICD-10-CM | POA: Diagnosis not present

## 2018-07-16 DIAGNOSIS — L02212 Cutaneous abscess of back [any part, except buttock]: Secondary | ICD-10-CM | POA: Diagnosis not present

## 2018-07-16 DIAGNOSIS — Z792 Long term (current) use of antibiotics: Secondary | ICD-10-CM | POA: Diagnosis not present

## 2018-07-16 DIAGNOSIS — E039 Hypothyroidism, unspecified: Secondary | ICD-10-CM | POA: Diagnosis not present

## 2018-07-16 DIAGNOSIS — Z9181 History of falling: Secondary | ICD-10-CM | POA: Diagnosis not present

## 2018-07-16 DIAGNOSIS — I2510 Atherosclerotic heart disease of native coronary artery without angina pectoris: Secondary | ICD-10-CM | POA: Diagnosis not present

## 2018-07-16 DIAGNOSIS — M48061 Spinal stenosis, lumbar region without neurogenic claudication: Secondary | ICD-10-CM | POA: Diagnosis not present

## 2018-07-16 DIAGNOSIS — Z981 Arthrodesis status: Secondary | ICD-10-CM | POA: Diagnosis not present

## 2018-07-19 DIAGNOSIS — K5730 Diverticulosis of large intestine without perforation or abscess without bleeding: Secondary | ICD-10-CM | POA: Diagnosis not present

## 2018-07-19 DIAGNOSIS — E1122 Type 2 diabetes mellitus with diabetic chronic kidney disease: Secondary | ICD-10-CM | POA: Diagnosis not present

## 2018-07-19 DIAGNOSIS — N183 Chronic kidney disease, stage 3 (moderate): Secondary | ICD-10-CM | POA: Diagnosis not present

## 2018-07-19 DIAGNOSIS — D631 Anemia in chronic kidney disease: Secondary | ICD-10-CM | POA: Diagnosis not present

## 2018-07-19 DIAGNOSIS — M1990 Unspecified osteoarthritis, unspecified site: Secondary | ICD-10-CM | POA: Diagnosis not present

## 2018-07-19 DIAGNOSIS — Z9181 History of falling: Secondary | ICD-10-CM | POA: Diagnosis not present

## 2018-07-19 DIAGNOSIS — I129 Hypertensive chronic kidney disease with stage 1 through stage 4 chronic kidney disease, or unspecified chronic kidney disease: Secondary | ICD-10-CM | POA: Diagnosis not present

## 2018-07-19 DIAGNOSIS — E039 Hypothyroidism, unspecified: Secondary | ICD-10-CM | POA: Diagnosis not present

## 2018-07-19 DIAGNOSIS — T8141XA Infection following a procedure, superficial incisional surgical site, initial encounter: Secondary | ICD-10-CM | POA: Diagnosis not present

## 2018-07-19 DIAGNOSIS — Z981 Arthrodesis status: Secondary | ICD-10-CM | POA: Diagnosis not present

## 2018-07-19 DIAGNOSIS — M48061 Spinal stenosis, lumbar region without neurogenic claudication: Secondary | ICD-10-CM | POA: Diagnosis not present

## 2018-07-19 DIAGNOSIS — Z792 Long term (current) use of antibiotics: Secondary | ICD-10-CM | POA: Diagnosis not present

## 2018-07-19 DIAGNOSIS — L02212 Cutaneous abscess of back [any part, except buttock]: Secondary | ICD-10-CM | POA: Diagnosis not present

## 2018-07-19 DIAGNOSIS — E785 Hyperlipidemia, unspecified: Secondary | ICD-10-CM | POA: Diagnosis not present

## 2018-07-19 DIAGNOSIS — I2510 Atherosclerotic heart disease of native coronary artery without angina pectoris: Secondary | ICD-10-CM | POA: Diagnosis not present

## 2018-07-21 DIAGNOSIS — M1990 Unspecified osteoarthritis, unspecified site: Secondary | ICD-10-CM | POA: Diagnosis not present

## 2018-07-21 DIAGNOSIS — E039 Hypothyroidism, unspecified: Secondary | ICD-10-CM | POA: Diagnosis not present

## 2018-07-21 DIAGNOSIS — Z981 Arthrodesis status: Secondary | ICD-10-CM | POA: Diagnosis not present

## 2018-07-21 DIAGNOSIS — K5730 Diverticulosis of large intestine without perforation or abscess without bleeding: Secondary | ICD-10-CM | POA: Diagnosis not present

## 2018-07-21 DIAGNOSIS — Z9181 History of falling: Secondary | ICD-10-CM | POA: Diagnosis not present

## 2018-07-21 DIAGNOSIS — D631 Anemia in chronic kidney disease: Secondary | ICD-10-CM | POA: Diagnosis not present

## 2018-07-21 DIAGNOSIS — E1122 Type 2 diabetes mellitus with diabetic chronic kidney disease: Secondary | ICD-10-CM | POA: Diagnosis not present

## 2018-07-21 DIAGNOSIS — E785 Hyperlipidemia, unspecified: Secondary | ICD-10-CM | POA: Diagnosis not present

## 2018-07-21 DIAGNOSIS — I129 Hypertensive chronic kidney disease with stage 1 through stage 4 chronic kidney disease, or unspecified chronic kidney disease: Secondary | ICD-10-CM | POA: Diagnosis not present

## 2018-07-21 DIAGNOSIS — N183 Chronic kidney disease, stage 3 (moderate): Secondary | ICD-10-CM | POA: Diagnosis not present

## 2018-07-21 DIAGNOSIS — M48061 Spinal stenosis, lumbar region without neurogenic claudication: Secondary | ICD-10-CM | POA: Diagnosis not present

## 2018-07-21 DIAGNOSIS — L02212 Cutaneous abscess of back [any part, except buttock]: Secondary | ICD-10-CM | POA: Diagnosis not present

## 2018-07-21 DIAGNOSIS — Z792 Long term (current) use of antibiotics: Secondary | ICD-10-CM | POA: Diagnosis not present

## 2018-07-21 DIAGNOSIS — I2510 Atherosclerotic heart disease of native coronary artery without angina pectoris: Secondary | ICD-10-CM | POA: Diagnosis not present

## 2018-07-21 DIAGNOSIS — T8141XA Infection following a procedure, superficial incisional surgical site, initial encounter: Secondary | ICD-10-CM | POA: Diagnosis not present

## 2018-07-23 ENCOUNTER — Ambulatory Visit: Payer: Self-pay | Admitting: *Deleted

## 2018-07-23 DIAGNOSIS — N183 Chronic kidney disease, stage 3 (moderate): Secondary | ICD-10-CM | POA: Diagnosis not present

## 2018-07-23 DIAGNOSIS — E1122 Type 2 diabetes mellitus with diabetic chronic kidney disease: Secondary | ICD-10-CM | POA: Diagnosis not present

## 2018-07-23 DIAGNOSIS — I129 Hypertensive chronic kidney disease with stage 1 through stage 4 chronic kidney disease, or unspecified chronic kidney disease: Secondary | ICD-10-CM | POA: Diagnosis not present

## 2018-07-23 DIAGNOSIS — E039 Hypothyroidism, unspecified: Secondary | ICD-10-CM | POA: Diagnosis not present

## 2018-07-23 DIAGNOSIS — M1990 Unspecified osteoarthritis, unspecified site: Secondary | ICD-10-CM | POA: Diagnosis not present

## 2018-07-23 DIAGNOSIS — T8141XA Infection following a procedure, superficial incisional surgical site, initial encounter: Secondary | ICD-10-CM | POA: Diagnosis not present

## 2018-07-23 DIAGNOSIS — Z981 Arthrodesis status: Secondary | ICD-10-CM | POA: Diagnosis not present

## 2018-07-23 DIAGNOSIS — K5730 Diverticulosis of large intestine without perforation or abscess without bleeding: Secondary | ICD-10-CM | POA: Diagnosis not present

## 2018-07-23 DIAGNOSIS — I2510 Atherosclerotic heart disease of native coronary artery without angina pectoris: Secondary | ICD-10-CM | POA: Diagnosis not present

## 2018-07-23 DIAGNOSIS — Z9181 History of falling: Secondary | ICD-10-CM | POA: Diagnosis not present

## 2018-07-23 DIAGNOSIS — L02212 Cutaneous abscess of back [any part, except buttock]: Secondary | ICD-10-CM | POA: Diagnosis not present

## 2018-07-23 DIAGNOSIS — D631 Anemia in chronic kidney disease: Secondary | ICD-10-CM | POA: Diagnosis not present

## 2018-07-23 DIAGNOSIS — M48061 Spinal stenosis, lumbar region without neurogenic claudication: Secondary | ICD-10-CM | POA: Diagnosis not present

## 2018-07-23 DIAGNOSIS — E785 Hyperlipidemia, unspecified: Secondary | ICD-10-CM | POA: Diagnosis not present

## 2018-07-23 DIAGNOSIS — Z792 Long term (current) use of antibiotics: Secondary | ICD-10-CM | POA: Diagnosis not present

## 2018-07-24 DIAGNOSIS — T8189XA Other complications of procedures, not elsewhere classified, initial encounter: Secondary | ICD-10-CM | POA: Diagnosis not present

## 2018-07-26 ENCOUNTER — Encounter: Payer: Self-pay | Admitting: *Deleted

## 2018-07-26 ENCOUNTER — Other Ambulatory Visit: Payer: Self-pay | Admitting: *Deleted

## 2018-07-26 DIAGNOSIS — E785 Hyperlipidemia, unspecified: Secondary | ICD-10-CM | POA: Diagnosis not present

## 2018-07-26 DIAGNOSIS — E1122 Type 2 diabetes mellitus with diabetic chronic kidney disease: Secondary | ICD-10-CM | POA: Diagnosis not present

## 2018-07-26 DIAGNOSIS — E039 Hypothyroidism, unspecified: Secondary | ICD-10-CM | POA: Diagnosis not present

## 2018-07-26 DIAGNOSIS — L02212 Cutaneous abscess of back [any part, except buttock]: Secondary | ICD-10-CM | POA: Diagnosis not present

## 2018-07-26 DIAGNOSIS — I129 Hypertensive chronic kidney disease with stage 1 through stage 4 chronic kidney disease, or unspecified chronic kidney disease: Secondary | ICD-10-CM | POA: Diagnosis not present

## 2018-07-26 DIAGNOSIS — T8141XA Infection following a procedure, superficial incisional surgical site, initial encounter: Secondary | ICD-10-CM | POA: Diagnosis not present

## 2018-07-26 DIAGNOSIS — Z9181 History of falling: Secondary | ICD-10-CM | POA: Diagnosis not present

## 2018-07-26 DIAGNOSIS — K5730 Diverticulosis of large intestine without perforation or abscess without bleeding: Secondary | ICD-10-CM | POA: Diagnosis not present

## 2018-07-26 DIAGNOSIS — M48061 Spinal stenosis, lumbar region without neurogenic claudication: Secondary | ICD-10-CM | POA: Diagnosis not present

## 2018-07-26 DIAGNOSIS — I2510 Atherosclerotic heart disease of native coronary artery without angina pectoris: Secondary | ICD-10-CM | POA: Diagnosis not present

## 2018-07-26 DIAGNOSIS — Z981 Arthrodesis status: Secondary | ICD-10-CM | POA: Diagnosis not present

## 2018-07-26 DIAGNOSIS — D631 Anemia in chronic kidney disease: Secondary | ICD-10-CM | POA: Diagnosis not present

## 2018-07-26 DIAGNOSIS — Z792 Long term (current) use of antibiotics: Secondary | ICD-10-CM | POA: Diagnosis not present

## 2018-07-26 DIAGNOSIS — M1990 Unspecified osteoarthritis, unspecified site: Secondary | ICD-10-CM | POA: Diagnosis not present

## 2018-07-26 DIAGNOSIS — N183 Chronic kidney disease, stage 3 (moderate): Secondary | ICD-10-CM | POA: Diagnosis not present

## 2018-07-26 NOTE — Patient Outreach (Signed)
[facility] [facility]) [place] Telephone Outreach PCP office completes Transition of Care follow up post- hospital discharge Post-hospital discharge day # 34  07/26/2018  LORRETTA KERCE [birth year] [id number]  Successful telephone Ignacia Marvel, 77 y/o female referred to [place] by inpatient RN CM after recent hospitalization June 22, 2018 for elective surgery for wound vac placement, as patient has had multiple recent post-operative wound infections post L5-S1 lumbar fusion in July 2019. Patient was discharged home to self-care with home health [facility]) services through [place] for wound care/ dressing changes. Patient has history including, but not limited to, HTN/ HLD; CKD- stage III; DM- Type II; hypothyroidism; diverticulosis; and morbid obesity. HIPAA/ identity verified with patient during phone call today.  Today, patient reports that she is "doing fine; waiting on home health nurse to arrive."  States her wound vac started "making a funny noise" this morning, and that she contacted home health nurse, who she is expecting "any time now." She denies pain and new/ recent falls. Patient sounds to be in no apparent distress throughout phone call today.  Patient further reports:  -- denies issues/ concerns/ problems around medications: patient is finally agreeable to medication review today, and this was completed; Patient was recently discharged from hospital and all medications were reviewed with patient today by phone; medication list was updated to reflect patient's current medications, of which some are different from current list in EMR.  Encouraged patient to have prompt review of medications by PCP, and she stated she "will do" after Christmas holiday  -- has scheduled post-hospital office visit with PCP for "first week of January," stating she will transport herself to appointment.  Encouraged patient to take medications to  appointment with her  -- home health [facility]) services continue:  Patient reports active participation in [facility] services; states nurse is ecpected "any time now," and prior to our ending today's call, [facility] nurse has arrived to evaluate patient/ wound vac issues reported by patient  -- we discussed signs/ symptoms ongoing or new infection; patient was encouraged to promptly notify care providers for fever, increased pain/ redness at surgical site; changes to drainage in wound vac; and ongoing or persistent malaise; patient verbalizes understanding and agreement.   Patient denies further issues, concerns, or problems today. Iconfirmed that patient hasmy direct phone number, the main [facility] CM office phone number, and the [facility] CM 24-hour nurse advice phone number should issues arise prior to next scheduled [place] outreach next month by phone. Encouraged patient to contact me directly if needs, questions, issues, or concerns arise prior to next scheduled outreach; patient agreed to do so.  Plan:  Patient will take medications as prescribed and will attend all scheduled provider appointments  Patient will promptly notify care providers for any new concerns/ issues/ problems that arise  Patient will actively participate in home health services as ordered post-hospital discharge  I will share today's [place] CM notes and care plan/ medication review with patient's PCP as initial assessment  [place] outreach to continue with scheduled phone callnext month  [facility] CM Care Plan Problem One     Most Recent Value  Care Plan Problem One  Risk for hospital re-admission related to/ evidenced by recent hospitalizations for post-operative wound infection    Role Documenting the Problem One  Care Management Coordinator  Care Plan for Problem One  Not Active  THN Long Term Goal   Over the next 70  days, patient will not experience unplanned hospital readmission, as evidenced by patient  reporting and review of EMR during [place] outreach  [place] Term Goal Start Date  06/24/18  [facility] Long Term Goal Met Date  07/26/18 [facility] Met]  Interventions for Problem One Long Term Goal  Confirmed that patient has had no hospital re-admissions,  discussed current clinical condition with patient and confirmed that she has no current clinical concerns  THN CM Short Term Goal #2   Over the next 30 days, patient will actively participate in home health services as ordered post-hospital discharge, as evidenced by patient reporting and collaboration with home health team as indicated during [place] outreach  [facility] CM Short Term Goal #2 Start Date  06/24/18  [facility] CM Short Term Goal #2 Met Date  07/26/18 [Goal Met]  Interventions for Short Term Goal #2  Confirmed that patient continues participating in home health services and has contact information for [facility] agency/ team    [facility] CM Care Plan Problem Two     Most Recent Value  Care Plan Problem Two  Ongoing self-health management of frequent post-surgical wound infections, as evidenced by patient reporting  Role Documenting the Problem Two  Care Management [place] for Problem Two  Active  Interventions for Problem Two Long Term Goal   Discussed with patient signs/ symptoms infection and encouraged patient to promptly notify care providers should she experience any,  discussed upcoming PCP appointment and confirmed that patient has transportation to appointment, and plans to driver herself to appointment,  encouraged ongoing participation in home health services  [place] Term Goal  Over the next 31 days, patient will promptly report any new concerns/ signs and symptoms of infection to care providers, as evidenced by patient reporting and collaboration with care providers as indicated during [place] CM outreach  [place] Term Goal Start Date  07/26/18     Oneta Rack, RN, BSN, [place]  Coordinator [facility] Care Management  [phone]

## 2018-07-27 ENCOUNTER — Other Ambulatory Visit: Payer: Self-pay | Admitting: *Deleted

## 2018-07-27 NOTE — Patient Outreach (Signed)
[place] [facility]) Care Management [place] Telephone [facility] Coordination  07/27/2018  Taylor Tyler [birth year] [id number]  Unsuccessful telephone outreachfor care coordination Taylor Tyler, 77 y/o female referred to [place] by inpatient RN CM after recent hospitalization June 22, 2018 for elective surgery for wound vac placement, as patient has had multiple recent post-operative wound infections post L5-S1 lumbar fusion in July 2019. Patient was discharged home to self-care with home health [facility]) services through [place] for wound care/ dressing changes. Patient has history including, but not limited to, HTN/ HLD; CKD- stage III; DM- Type II; hypothyroidism; diverticulosis; and morbid obesity.   After speaking with patient yesterday and sending initial assessment to patient's PCP, I received secure message via EMR from patient's PCP, requesting that I contact patient to make sure that she had been seen by her renal providers prior to scheduled PCP office visit in January.  I left patient HIPAA compliant message indicating same, requesting call back if patient had further questions; I also encouraged patient to contact her PCP if she had further questions around PCP request.  Plan:  [place] outreach to continue as previously scheduled.  Oneta Rack, RN, BSN, [employer] [facility] Care Management  [phone]

## 2018-07-29 DIAGNOSIS — E1122 Type 2 diabetes mellitus with diabetic chronic kidney disease: Secondary | ICD-10-CM | POA: Diagnosis not present

## 2018-07-29 DIAGNOSIS — M48061 Spinal stenosis, lumbar region without neurogenic claudication: Secondary | ICD-10-CM | POA: Diagnosis not present

## 2018-07-29 DIAGNOSIS — Z9181 History of falling: Secondary | ICD-10-CM | POA: Diagnosis not present

## 2018-07-29 DIAGNOSIS — I2510 Atherosclerotic heart disease of native coronary artery without angina pectoris: Secondary | ICD-10-CM | POA: Diagnosis not present

## 2018-07-29 DIAGNOSIS — E785 Hyperlipidemia, unspecified: Secondary | ICD-10-CM | POA: Diagnosis not present

## 2018-07-29 DIAGNOSIS — D631 Anemia in chronic kidney disease: Secondary | ICD-10-CM | POA: Diagnosis not present

## 2018-07-29 DIAGNOSIS — T8141XA Infection following a procedure, superficial incisional surgical site, initial encounter: Secondary | ICD-10-CM | POA: Diagnosis not present

## 2018-07-29 DIAGNOSIS — E039 Hypothyroidism, unspecified: Secondary | ICD-10-CM | POA: Diagnosis not present

## 2018-07-29 DIAGNOSIS — N183 Chronic kidney disease, stage 3 (moderate): Secondary | ICD-10-CM | POA: Diagnosis not present

## 2018-07-29 DIAGNOSIS — Z981 Arthrodesis status: Secondary | ICD-10-CM | POA: Diagnosis not present

## 2018-07-29 DIAGNOSIS — Z792 Long term (current) use of antibiotics: Secondary | ICD-10-CM | POA: Diagnosis not present

## 2018-07-29 DIAGNOSIS — M1990 Unspecified osteoarthritis, unspecified site: Secondary | ICD-10-CM | POA: Diagnosis not present

## 2018-07-29 DIAGNOSIS — K5730 Diverticulosis of large intestine without perforation or abscess without bleeding: Secondary | ICD-10-CM | POA: Diagnosis not present

## 2018-07-29 DIAGNOSIS — I129 Hypertensive chronic kidney disease with stage 1 through stage 4 chronic kidney disease, or unspecified chronic kidney disease: Secondary | ICD-10-CM | POA: Diagnosis not present

## 2018-07-29 DIAGNOSIS — L02212 Cutaneous abscess of back [any part, except buttock]: Secondary | ICD-10-CM | POA: Diagnosis not present

## 2018-08-02 DIAGNOSIS — I129 Hypertensive chronic kidney disease with stage 1 through stage 4 chronic kidney disease, or unspecified chronic kidney disease: Secondary | ICD-10-CM | POA: Diagnosis not present

## 2018-08-02 DIAGNOSIS — N183 Chronic kidney disease, stage 3 (moderate): Secondary | ICD-10-CM | POA: Diagnosis not present

## 2018-08-02 DIAGNOSIS — T8141XA Infection following a procedure, superficial incisional surgical site, initial encounter: Secondary | ICD-10-CM | POA: Diagnosis not present

## 2018-08-02 DIAGNOSIS — K5730 Diverticulosis of large intestine without perforation or abscess without bleeding: Secondary | ICD-10-CM | POA: Diagnosis not present

## 2018-08-02 DIAGNOSIS — Z981 Arthrodesis status: Secondary | ICD-10-CM | POA: Diagnosis not present

## 2018-08-02 DIAGNOSIS — M48061 Spinal stenosis, lumbar region without neurogenic claudication: Secondary | ICD-10-CM | POA: Diagnosis not present

## 2018-08-02 DIAGNOSIS — I2510 Atherosclerotic heart disease of native coronary artery without angina pectoris: Secondary | ICD-10-CM | POA: Diagnosis not present

## 2018-08-02 DIAGNOSIS — M1990 Unspecified osteoarthritis, unspecified site: Secondary | ICD-10-CM | POA: Diagnosis not present

## 2018-08-02 DIAGNOSIS — Z792 Long term (current) use of antibiotics: Secondary | ICD-10-CM | POA: Diagnosis not present

## 2018-08-02 DIAGNOSIS — E785 Hyperlipidemia, unspecified: Secondary | ICD-10-CM | POA: Diagnosis not present

## 2018-08-02 DIAGNOSIS — Z9181 History of falling: Secondary | ICD-10-CM | POA: Diagnosis not present

## 2018-08-02 DIAGNOSIS — L02212 Cutaneous abscess of back [any part, except buttock]: Secondary | ICD-10-CM | POA: Diagnosis not present

## 2018-08-02 DIAGNOSIS — D631 Anemia in chronic kidney disease: Secondary | ICD-10-CM | POA: Diagnosis not present

## 2018-08-02 DIAGNOSIS — E1122 Type 2 diabetes mellitus with diabetic chronic kidney disease: Secondary | ICD-10-CM | POA: Diagnosis not present

## 2018-08-02 DIAGNOSIS — E039 Hypothyroidism, unspecified: Secondary | ICD-10-CM | POA: Diagnosis not present

## 2018-08-04 DIAGNOSIS — T8189XA Other complications of procedures, not elsewhere classified, initial encounter: Secondary | ICD-10-CM | POA: Diagnosis not present

## 2018-08-05 DIAGNOSIS — E1122 Type 2 diabetes mellitus with diabetic chronic kidney disease: Secondary | ICD-10-CM | POA: Diagnosis not present

## 2018-08-05 DIAGNOSIS — N183 Chronic kidney disease, stage 3 (moderate): Secondary | ICD-10-CM | POA: Diagnosis not present

## 2018-08-05 DIAGNOSIS — L02212 Cutaneous abscess of back [any part, except buttock]: Secondary | ICD-10-CM | POA: Diagnosis not present

## 2018-08-05 DIAGNOSIS — D631 Anemia in chronic kidney disease: Secondary | ICD-10-CM | POA: Diagnosis not present

## 2018-08-05 DIAGNOSIS — T8141XA Infection following a procedure, superficial incisional surgical site, initial encounter: Secondary | ICD-10-CM | POA: Diagnosis not present

## 2018-08-05 DIAGNOSIS — K5730 Diverticulosis of large intestine without perforation or abscess without bleeding: Secondary | ICD-10-CM | POA: Diagnosis not present

## 2018-08-05 DIAGNOSIS — E785 Hyperlipidemia, unspecified: Secondary | ICD-10-CM | POA: Diagnosis not present

## 2018-08-05 DIAGNOSIS — Z792 Long term (current) use of antibiotics: Secondary | ICD-10-CM | POA: Diagnosis not present

## 2018-08-05 DIAGNOSIS — M1990 Unspecified osteoarthritis, unspecified site: Secondary | ICD-10-CM | POA: Diagnosis not present

## 2018-08-05 DIAGNOSIS — Z981 Arthrodesis status: Secondary | ICD-10-CM | POA: Diagnosis not present

## 2018-08-05 DIAGNOSIS — M48061 Spinal stenosis, lumbar region without neurogenic claudication: Secondary | ICD-10-CM | POA: Diagnosis not present

## 2018-08-05 DIAGNOSIS — Z9181 History of falling: Secondary | ICD-10-CM | POA: Diagnosis not present

## 2018-08-05 DIAGNOSIS — I2510 Atherosclerotic heart disease of native coronary artery without angina pectoris: Secondary | ICD-10-CM | POA: Diagnosis not present

## 2018-08-05 DIAGNOSIS — E039 Hypothyroidism, unspecified: Secondary | ICD-10-CM | POA: Diagnosis not present

## 2018-08-05 DIAGNOSIS — I129 Hypertensive chronic kidney disease with stage 1 through stage 4 chronic kidney disease, or unspecified chronic kidney disease: Secondary | ICD-10-CM | POA: Diagnosis not present

## 2018-08-09 DIAGNOSIS — Z792 Long term (current) use of antibiotics: Secondary | ICD-10-CM | POA: Diagnosis not present

## 2018-08-09 DIAGNOSIS — K5730 Diverticulosis of large intestine without perforation or abscess without bleeding: Secondary | ICD-10-CM | POA: Diagnosis not present

## 2018-08-09 DIAGNOSIS — L02212 Cutaneous abscess of back [any part, except buttock]: Secondary | ICD-10-CM | POA: Diagnosis not present

## 2018-08-09 DIAGNOSIS — I129 Hypertensive chronic kidney disease with stage 1 through stage 4 chronic kidney disease, or unspecified chronic kidney disease: Secondary | ICD-10-CM | POA: Diagnosis not present

## 2018-08-09 DIAGNOSIS — E1122 Type 2 diabetes mellitus with diabetic chronic kidney disease: Secondary | ICD-10-CM | POA: Diagnosis not present

## 2018-08-09 DIAGNOSIS — N183 Chronic kidney disease, stage 3 (moderate): Secondary | ICD-10-CM | POA: Diagnosis not present

## 2018-08-09 DIAGNOSIS — D631 Anemia in chronic kidney disease: Secondary | ICD-10-CM | POA: Diagnosis not present

## 2018-08-09 DIAGNOSIS — I2510 Atherosclerotic heart disease of native coronary artery without angina pectoris: Secondary | ICD-10-CM | POA: Diagnosis not present

## 2018-08-09 DIAGNOSIS — Z9181 History of falling: Secondary | ICD-10-CM | POA: Diagnosis not present

## 2018-08-09 DIAGNOSIS — Z981 Arthrodesis status: Secondary | ICD-10-CM | POA: Diagnosis not present

## 2018-08-09 DIAGNOSIS — M1990 Unspecified osteoarthritis, unspecified site: Secondary | ICD-10-CM | POA: Diagnosis not present

## 2018-08-09 DIAGNOSIS — M48061 Spinal stenosis, lumbar region without neurogenic claudication: Secondary | ICD-10-CM | POA: Diagnosis not present

## 2018-08-09 DIAGNOSIS — E039 Hypothyroidism, unspecified: Secondary | ICD-10-CM | POA: Diagnosis not present

## 2018-08-09 DIAGNOSIS — T8141XA Infection following a procedure, superficial incisional surgical site, initial encounter: Secondary | ICD-10-CM | POA: Diagnosis not present

## 2018-08-09 DIAGNOSIS — E785 Hyperlipidemia, unspecified: Secondary | ICD-10-CM | POA: Diagnosis not present

## 2018-08-11 DIAGNOSIS — N184 Chronic kidney disease, stage 4 (severe): Secondary | ICD-10-CM | POA: Diagnosis not present

## 2018-08-11 DIAGNOSIS — I129 Hypertensive chronic kidney disease with stage 1 through stage 4 chronic kidney disease, or unspecified chronic kidney disease: Secondary | ICD-10-CM | POA: Diagnosis not present

## 2018-08-11 DIAGNOSIS — D649 Anemia, unspecified: Secondary | ICD-10-CM | POA: Diagnosis not present

## 2018-08-11 DIAGNOSIS — R809 Proteinuria, unspecified: Secondary | ICD-10-CM | POA: Diagnosis not present

## 2018-08-12 DIAGNOSIS — M1990 Unspecified osteoarthritis, unspecified site: Secondary | ICD-10-CM | POA: Diagnosis not present

## 2018-08-12 DIAGNOSIS — N183 Chronic kidney disease, stage 3 (moderate): Secondary | ICD-10-CM | POA: Diagnosis not present

## 2018-08-12 DIAGNOSIS — E039 Hypothyroidism, unspecified: Secondary | ICD-10-CM | POA: Diagnosis not present

## 2018-08-12 DIAGNOSIS — I2510 Atherosclerotic heart disease of native coronary artery without angina pectoris: Secondary | ICD-10-CM | POA: Diagnosis not present

## 2018-08-12 DIAGNOSIS — D631 Anemia in chronic kidney disease: Secondary | ICD-10-CM | POA: Diagnosis not present

## 2018-08-12 DIAGNOSIS — M48061 Spinal stenosis, lumbar region without neurogenic claudication: Secondary | ICD-10-CM | POA: Diagnosis not present

## 2018-08-12 DIAGNOSIS — Z9181 History of falling: Secondary | ICD-10-CM | POA: Diagnosis not present

## 2018-08-12 DIAGNOSIS — I129 Hypertensive chronic kidney disease with stage 1 through stage 4 chronic kidney disease, or unspecified chronic kidney disease: Secondary | ICD-10-CM | POA: Diagnosis not present

## 2018-08-12 DIAGNOSIS — L02212 Cutaneous abscess of back [any part, except buttock]: Secondary | ICD-10-CM | POA: Diagnosis not present

## 2018-08-12 DIAGNOSIS — T8141XA Infection following a procedure, superficial incisional surgical site, initial encounter: Secondary | ICD-10-CM | POA: Diagnosis not present

## 2018-08-12 DIAGNOSIS — K5730 Diverticulosis of large intestine without perforation or abscess without bleeding: Secondary | ICD-10-CM | POA: Diagnosis not present

## 2018-08-12 DIAGNOSIS — E785 Hyperlipidemia, unspecified: Secondary | ICD-10-CM | POA: Diagnosis not present

## 2018-08-12 DIAGNOSIS — Z981 Arthrodesis status: Secondary | ICD-10-CM | POA: Diagnosis not present

## 2018-08-12 DIAGNOSIS — Z792 Long term (current) use of antibiotics: Secondary | ICD-10-CM | POA: Diagnosis not present

## 2018-08-12 DIAGNOSIS — E1122 Type 2 diabetes mellitus with diabetic chronic kidney disease: Secondary | ICD-10-CM | POA: Diagnosis not present

## 2018-08-16 DIAGNOSIS — E785 Hyperlipidemia, unspecified: Secondary | ICD-10-CM | POA: Diagnosis not present

## 2018-08-16 DIAGNOSIS — L02212 Cutaneous abscess of back [any part, except buttock]: Secondary | ICD-10-CM | POA: Diagnosis not present

## 2018-08-16 DIAGNOSIS — E039 Hypothyroidism, unspecified: Secondary | ICD-10-CM | POA: Diagnosis not present

## 2018-08-16 DIAGNOSIS — Z981 Arthrodesis status: Secondary | ICD-10-CM | POA: Diagnosis not present

## 2018-08-16 DIAGNOSIS — E1122 Type 2 diabetes mellitus with diabetic chronic kidney disease: Secondary | ICD-10-CM | POA: Diagnosis not present

## 2018-08-16 DIAGNOSIS — Z792 Long term (current) use of antibiotics: Secondary | ICD-10-CM | POA: Diagnosis not present

## 2018-08-16 DIAGNOSIS — N183 Chronic kidney disease, stage 3 (moderate): Secondary | ICD-10-CM | POA: Diagnosis not present

## 2018-08-16 DIAGNOSIS — D631 Anemia in chronic kidney disease: Secondary | ICD-10-CM | POA: Diagnosis not present

## 2018-08-16 DIAGNOSIS — Z9181 History of falling: Secondary | ICD-10-CM | POA: Diagnosis not present

## 2018-08-16 DIAGNOSIS — T8141XA Infection following a procedure, superficial incisional surgical site, initial encounter: Secondary | ICD-10-CM | POA: Diagnosis not present

## 2018-08-16 DIAGNOSIS — M1990 Unspecified osteoarthritis, unspecified site: Secondary | ICD-10-CM | POA: Diagnosis not present

## 2018-08-16 DIAGNOSIS — I129 Hypertensive chronic kidney disease with stage 1 through stage 4 chronic kidney disease, or unspecified chronic kidney disease: Secondary | ICD-10-CM | POA: Diagnosis not present

## 2018-08-16 DIAGNOSIS — K5730 Diverticulosis of large intestine without perforation or abscess without bleeding: Secondary | ICD-10-CM | POA: Diagnosis not present

## 2018-08-16 DIAGNOSIS — M48061 Spinal stenosis, lumbar region without neurogenic claudication: Secondary | ICD-10-CM | POA: Diagnosis not present

## 2018-08-16 DIAGNOSIS — I2510 Atherosclerotic heart disease of native coronary artery without angina pectoris: Secondary | ICD-10-CM | POA: Diagnosis not present

## 2018-08-18 DIAGNOSIS — I2510 Atherosclerotic heart disease of native coronary artery without angina pectoris: Secondary | ICD-10-CM | POA: Diagnosis not present

## 2018-08-18 DIAGNOSIS — K5730 Diverticulosis of large intestine without perforation or abscess without bleeding: Secondary | ICD-10-CM | POA: Diagnosis not present

## 2018-08-18 DIAGNOSIS — N183 Chronic kidney disease, stage 3 (moderate): Secondary | ICD-10-CM | POA: Diagnosis not present

## 2018-08-18 DIAGNOSIS — M48061 Spinal stenosis, lumbar region without neurogenic claudication: Secondary | ICD-10-CM | POA: Diagnosis not present

## 2018-08-18 DIAGNOSIS — Z981 Arthrodesis status: Secondary | ICD-10-CM | POA: Diagnosis not present

## 2018-08-18 DIAGNOSIS — I129 Hypertensive chronic kidney disease with stage 1 through stage 4 chronic kidney disease, or unspecified chronic kidney disease: Secondary | ICD-10-CM | POA: Diagnosis not present

## 2018-08-18 DIAGNOSIS — D631 Anemia in chronic kidney disease: Secondary | ICD-10-CM | POA: Diagnosis not present

## 2018-08-18 DIAGNOSIS — E1122 Type 2 diabetes mellitus with diabetic chronic kidney disease: Secondary | ICD-10-CM | POA: Diagnosis not present

## 2018-08-18 DIAGNOSIS — Z792 Long term (current) use of antibiotics: Secondary | ICD-10-CM | POA: Diagnosis not present

## 2018-08-18 DIAGNOSIS — M1990 Unspecified osteoarthritis, unspecified site: Secondary | ICD-10-CM | POA: Diagnosis not present

## 2018-08-18 DIAGNOSIS — E039 Hypothyroidism, unspecified: Secondary | ICD-10-CM | POA: Diagnosis not present

## 2018-08-18 DIAGNOSIS — Z9181 History of falling: Secondary | ICD-10-CM | POA: Diagnosis not present

## 2018-08-18 DIAGNOSIS — E785 Hyperlipidemia, unspecified: Secondary | ICD-10-CM | POA: Diagnosis not present

## 2018-08-18 DIAGNOSIS — T8141XA Infection following a procedure, superficial incisional surgical site, initial encounter: Secondary | ICD-10-CM | POA: Diagnosis not present

## 2018-08-18 DIAGNOSIS — L02212 Cutaneous abscess of back [any part, except buttock]: Secondary | ICD-10-CM | POA: Diagnosis not present

## 2018-08-20 DIAGNOSIS — E1122 Type 2 diabetes mellitus with diabetic chronic kidney disease: Secondary | ICD-10-CM | POA: Diagnosis not present

## 2018-08-20 DIAGNOSIS — M1990 Unspecified osteoarthritis, unspecified site: Secondary | ICD-10-CM | POA: Diagnosis not present

## 2018-08-20 DIAGNOSIS — E039 Hypothyroidism, unspecified: Secondary | ICD-10-CM | POA: Diagnosis not present

## 2018-08-20 DIAGNOSIS — E785 Hyperlipidemia, unspecified: Secondary | ICD-10-CM | POA: Diagnosis not present

## 2018-08-20 DIAGNOSIS — I129 Hypertensive chronic kidney disease with stage 1 through stage 4 chronic kidney disease, or unspecified chronic kidney disease: Secondary | ICD-10-CM | POA: Diagnosis not present

## 2018-08-20 DIAGNOSIS — I2510 Atherosclerotic heart disease of native coronary artery without angina pectoris: Secondary | ICD-10-CM | POA: Diagnosis not present

## 2018-08-20 DIAGNOSIS — M48061 Spinal stenosis, lumbar region without neurogenic claudication: Secondary | ICD-10-CM | POA: Diagnosis not present

## 2018-08-20 DIAGNOSIS — Z981 Arthrodesis status: Secondary | ICD-10-CM | POA: Diagnosis not present

## 2018-08-20 DIAGNOSIS — N183 Chronic kidney disease, stage 3 (moderate): Secondary | ICD-10-CM | POA: Diagnosis not present

## 2018-08-20 DIAGNOSIS — Z9181 History of falling: Secondary | ICD-10-CM | POA: Diagnosis not present

## 2018-08-20 DIAGNOSIS — L02212 Cutaneous abscess of back [any part, except buttock]: Secondary | ICD-10-CM | POA: Diagnosis not present

## 2018-08-20 DIAGNOSIS — D631 Anemia in chronic kidney disease: Secondary | ICD-10-CM | POA: Diagnosis not present

## 2018-08-20 DIAGNOSIS — K5730 Diverticulosis of large intestine without perforation or abscess without bleeding: Secondary | ICD-10-CM | POA: Diagnosis not present

## 2018-08-20 DIAGNOSIS — Z792 Long term (current) use of antibiotics: Secondary | ICD-10-CM | POA: Diagnosis not present

## 2018-08-20 DIAGNOSIS — T8141XA Infection following a procedure, superficial incisional surgical site, initial encounter: Secondary | ICD-10-CM | POA: Diagnosis not present

## 2018-08-23 DIAGNOSIS — M48061 Spinal stenosis, lumbar region without neurogenic claudication: Secondary | ICD-10-CM | POA: Diagnosis not present

## 2018-08-23 DIAGNOSIS — Z981 Arthrodesis status: Secondary | ICD-10-CM | POA: Diagnosis not present

## 2018-08-23 DIAGNOSIS — E039 Hypothyroidism, unspecified: Secondary | ICD-10-CM | POA: Diagnosis not present

## 2018-08-23 DIAGNOSIS — E1122 Type 2 diabetes mellitus with diabetic chronic kidney disease: Secondary | ICD-10-CM | POA: Diagnosis not present

## 2018-08-23 DIAGNOSIS — K5730 Diverticulosis of large intestine without perforation or abscess without bleeding: Secondary | ICD-10-CM | POA: Diagnosis not present

## 2018-08-23 DIAGNOSIS — E785 Hyperlipidemia, unspecified: Secondary | ICD-10-CM | POA: Diagnosis not present

## 2018-08-23 DIAGNOSIS — L02212 Cutaneous abscess of back [any part, except buttock]: Secondary | ICD-10-CM | POA: Diagnosis not present

## 2018-08-23 DIAGNOSIS — I2510 Atherosclerotic heart disease of native coronary artery without angina pectoris: Secondary | ICD-10-CM | POA: Diagnosis not present

## 2018-08-23 DIAGNOSIS — N183 Chronic kidney disease, stage 3 (moderate): Secondary | ICD-10-CM | POA: Diagnosis not present

## 2018-08-23 DIAGNOSIS — M1990 Unspecified osteoarthritis, unspecified site: Secondary | ICD-10-CM | POA: Diagnosis not present

## 2018-08-23 DIAGNOSIS — I129 Hypertensive chronic kidney disease with stage 1 through stage 4 chronic kidney disease, or unspecified chronic kidney disease: Secondary | ICD-10-CM | POA: Diagnosis not present

## 2018-08-23 DIAGNOSIS — Z9181 History of falling: Secondary | ICD-10-CM | POA: Diagnosis not present

## 2018-08-23 DIAGNOSIS — T8141XA Infection following a procedure, superficial incisional surgical site, initial encounter: Secondary | ICD-10-CM | POA: Diagnosis not present

## 2018-08-23 DIAGNOSIS — D631 Anemia in chronic kidney disease: Secondary | ICD-10-CM | POA: Diagnosis not present

## 2018-08-23 DIAGNOSIS — Z792 Long term (current) use of antibiotics: Secondary | ICD-10-CM | POA: Diagnosis not present

## 2018-08-25 ENCOUNTER — Encounter: Payer: Self-pay | Admitting: *Deleted

## 2018-08-25 ENCOUNTER — Other Ambulatory Visit: Payer: Self-pay | Admitting: *Deleted

## 2018-08-25 DIAGNOSIS — E1122 Type 2 diabetes mellitus with diabetic chronic kidney disease: Secondary | ICD-10-CM | POA: Diagnosis not present

## 2018-08-25 DIAGNOSIS — I2510 Atherosclerotic heart disease of native coronary artery without angina pectoris: Secondary | ICD-10-CM | POA: Diagnosis not present

## 2018-08-25 DIAGNOSIS — E039 Hypothyroidism, unspecified: Secondary | ICD-10-CM | POA: Diagnosis not present

## 2018-08-25 DIAGNOSIS — Z9181 History of falling: Secondary | ICD-10-CM | POA: Diagnosis not present

## 2018-08-25 DIAGNOSIS — D631 Anemia in chronic kidney disease: Secondary | ICD-10-CM | POA: Diagnosis not present

## 2018-08-25 DIAGNOSIS — M1990 Unspecified osteoarthritis, unspecified site: Secondary | ICD-10-CM | POA: Diagnosis not present

## 2018-08-25 DIAGNOSIS — I129 Hypertensive chronic kidney disease with stage 1 through stage 4 chronic kidney disease, or unspecified chronic kidney disease: Secondary | ICD-10-CM | POA: Diagnosis not present

## 2018-08-25 DIAGNOSIS — E785 Hyperlipidemia, unspecified: Secondary | ICD-10-CM | POA: Diagnosis not present

## 2018-08-25 DIAGNOSIS — K5730 Diverticulosis of large intestine without perforation or abscess without bleeding: Secondary | ICD-10-CM | POA: Diagnosis not present

## 2018-08-25 DIAGNOSIS — T8141XA Infection following a procedure, superficial incisional surgical site, initial encounter: Secondary | ICD-10-CM | POA: Diagnosis not present

## 2018-08-25 DIAGNOSIS — N183 Chronic kidney disease, stage 3 (moderate): Secondary | ICD-10-CM | POA: Diagnosis not present

## 2018-08-25 DIAGNOSIS — Z981 Arthrodesis status: Secondary | ICD-10-CM | POA: Diagnosis not present

## 2018-08-25 DIAGNOSIS — M48061 Spinal stenosis, lumbar region without neurogenic claudication: Secondary | ICD-10-CM | POA: Diagnosis not present

## 2018-08-25 DIAGNOSIS — L02212 Cutaneous abscess of back [any part, except buttock]: Secondary | ICD-10-CM | POA: Diagnosis not present

## 2018-08-25 DIAGNOSIS — Z792 Long term (current) use of antibiotics: Secondary | ICD-10-CM | POA: Diagnosis not present

## 2018-08-25 NOTE — Patient Outreach (Signed)
[place] [facility]) [place] Telephone Outreach PCP office completes Transition of Care follow up post-hospital discharge Post-hospital discharge day # 64 Unsuccessful consecutive outreach attempt # 1- previously engaged patient  08/25/2018  [name] 23-Dec-1940 [id number]  Unsuccessful telephone Taylor Tyler, 78 y/o female referred to [place] by inpatient RN CM after recent hospitalization June 22, 2018 for elective surgery for wound vac placement, as patient has had multiple recent post-operative wound infections post L5-S1 lumbar fusion in July 2019. Patient was discharged home to self-care with home health [facility]) services through [place] for wound care/ dressing changes. Patient has history including, but not limited to, HTN/ HLD; CKD- stage III; DM- Type II; hypothyroidism; diverticulosis; and morbid obesity.  HIPAA compliant voice mail message left for patient, requesting return call back.  Plan:  Will re-attempt [place] telephone outreach again within 4 business days if I do not hear back from patient first.  Oneta Rack, RN, BSN, [place] Coordinator [facility] Care Management  [phone]

## 2018-08-27 DIAGNOSIS — Z792 Long term (current) use of antibiotics: Secondary | ICD-10-CM | POA: Diagnosis not present

## 2018-08-27 DIAGNOSIS — T8141XA Infection following a procedure, superficial incisional surgical site, initial encounter: Secondary | ICD-10-CM | POA: Diagnosis not present

## 2018-08-27 DIAGNOSIS — Z9181 History of falling: Secondary | ICD-10-CM | POA: Diagnosis not present

## 2018-08-27 DIAGNOSIS — L02212 Cutaneous abscess of back [any part, except buttock]: Secondary | ICD-10-CM | POA: Diagnosis not present

## 2018-08-27 DIAGNOSIS — D631 Anemia in chronic kidney disease: Secondary | ICD-10-CM | POA: Diagnosis not present

## 2018-08-27 DIAGNOSIS — E1122 Type 2 diabetes mellitus with diabetic chronic kidney disease: Secondary | ICD-10-CM | POA: Diagnosis not present

## 2018-08-27 DIAGNOSIS — I2510 Atherosclerotic heart disease of native coronary artery without angina pectoris: Secondary | ICD-10-CM | POA: Diagnosis not present

## 2018-08-27 DIAGNOSIS — K5730 Diverticulosis of large intestine without perforation or abscess without bleeding: Secondary | ICD-10-CM | POA: Diagnosis not present

## 2018-08-27 DIAGNOSIS — E039 Hypothyroidism, unspecified: Secondary | ICD-10-CM | POA: Diagnosis not present

## 2018-08-27 DIAGNOSIS — I129 Hypertensive chronic kidney disease with stage 1 through stage 4 chronic kidney disease, or unspecified chronic kidney disease: Secondary | ICD-10-CM | POA: Diagnosis not present

## 2018-08-27 DIAGNOSIS — Z981 Arthrodesis status: Secondary | ICD-10-CM | POA: Diagnosis not present

## 2018-08-27 DIAGNOSIS — E785 Hyperlipidemia, unspecified: Secondary | ICD-10-CM | POA: Diagnosis not present

## 2018-08-27 DIAGNOSIS — N183 Chronic kidney disease, stage 3 (moderate): Secondary | ICD-10-CM | POA: Diagnosis not present

## 2018-08-27 DIAGNOSIS — M48061 Spinal stenosis, lumbar region without neurogenic claudication: Secondary | ICD-10-CM | POA: Diagnosis not present

## 2018-08-27 DIAGNOSIS — M1990 Unspecified osteoarthritis, unspecified site: Secondary | ICD-10-CM | POA: Diagnosis not present

## 2018-08-30 ENCOUNTER — Encounter: Payer: Self-pay | Admitting: *Deleted

## 2018-08-30 ENCOUNTER — Other Ambulatory Visit: Payer: Self-pay | Admitting: *Deleted

## 2018-08-30 DIAGNOSIS — N183 Chronic kidney disease, stage 3 (moderate): Secondary | ICD-10-CM | POA: Diagnosis not present

## 2018-08-30 DIAGNOSIS — Z9181 History of falling: Secondary | ICD-10-CM | POA: Diagnosis not present

## 2018-08-30 DIAGNOSIS — M1990 Unspecified osteoarthritis, unspecified site: Secondary | ICD-10-CM | POA: Diagnosis not present

## 2018-08-30 DIAGNOSIS — E785 Hyperlipidemia, unspecified: Secondary | ICD-10-CM | POA: Diagnosis not present

## 2018-08-30 DIAGNOSIS — L02212 Cutaneous abscess of back [any part, except buttock]: Secondary | ICD-10-CM | POA: Diagnosis not present

## 2018-08-30 DIAGNOSIS — I129 Hypertensive chronic kidney disease with stage 1 through stage 4 chronic kidney disease, or unspecified chronic kidney disease: Secondary | ICD-10-CM | POA: Diagnosis not present

## 2018-08-30 DIAGNOSIS — D631 Anemia in chronic kidney disease: Secondary | ICD-10-CM | POA: Diagnosis not present

## 2018-08-30 DIAGNOSIS — E1122 Type 2 diabetes mellitus with diabetic chronic kidney disease: Secondary | ICD-10-CM | POA: Diagnosis not present

## 2018-08-30 DIAGNOSIS — Z792 Long term (current) use of antibiotics: Secondary | ICD-10-CM | POA: Diagnosis not present

## 2018-08-30 DIAGNOSIS — M48061 Spinal stenosis, lumbar region without neurogenic claudication: Secondary | ICD-10-CM | POA: Diagnosis not present

## 2018-08-30 DIAGNOSIS — Z981 Arthrodesis status: Secondary | ICD-10-CM | POA: Diagnosis not present

## 2018-08-30 DIAGNOSIS — E039 Hypothyroidism, unspecified: Secondary | ICD-10-CM | POA: Diagnosis not present

## 2018-08-30 DIAGNOSIS — I2510 Atherosclerotic heart disease of native coronary artery without angina pectoris: Secondary | ICD-10-CM | POA: Diagnosis not present

## 2018-08-30 DIAGNOSIS — T8141XA Infection following a procedure, superficial incisional surgical site, initial encounter: Secondary | ICD-10-CM | POA: Diagnosis not present

## 2018-08-30 DIAGNOSIS — K5730 Diverticulosis of large intestine without perforation or abscess without bleeding: Secondary | ICD-10-CM | POA: Diagnosis not present

## 2018-08-30 NOTE — Patient Outreach (Signed)
[place] [facility]) Care Management THN Community CM Telephone Outreach Post-hospital discharge day # [phone] [place] Case Closure  08/30/2018  LEATHER ESTIS 09/07/1940 [id number]  Successful telephone Ignacia Marvel, 78 y/o female referred to [place] by inpatient RN CM after recent hospitalization June 22, 2018 for elective surgery for wound vac placement, as patient has had multiple recent post-operative wound infections post L5-S1 lumbar fusion in July 2019. Patient was discharged home to self-care with home health [facility]) services through [place] for wound care/ dressing changes. Patient has history including, but not limited to, HTN/ HLD; CKD- stage III; DM- Type II; hypothyroidism; diverticulosis; and morbid obesity. HIPAA/ identity verified with patient during phone call today.  Today, patient reports that she is "doingfine; waiting on home health nurse to arrive."  States her wound vac is still in place and denies concerns around wound vac functioning; patient denies pain today, but states she has her 'normal aches and pains."  Denies new/ recent falls.  Patient sounds to be in no apparent distress throughout phone call today.  Patient further reports:  -- denies issues/ concerns/ problems around medications: continues self-managing medicaitions  -- unable to remember when she visited last with PCP; stated "it was awhile ago," and denies need to see PCP again prior to next scheduled office visit in spring of this year; confirms that she continues transporting herself to appointments/ errands.    -- home health [facility]) services continue:  Patient reports ongoing active participation in [facility] services; states nurse is excpected "any time now."  Reports home health team continues visiting "every Monday- Wednesday- Friday."  Confirms that she has the number to [facility] agency  -- reiterated/ reviewed with patient previously provided education around signs/  symptoms ongoing or new infection; patient was encouraged to promptly notify care providers for fever, increased pain/ redness at surgical site; changes to drainage in wound vac; and ongoing or persistent malaise; patient verbalizes understanding and agreement.   Patient denies further issues, concerns, or problems today. Discussed with patient that she has thus far met her previously established [place] CM goals/ case closure; patient agrees with [place] CM case closure today and she denies further care coordination needs. Iconfirmed that patient hasmy direct phone number, the main [facility] CM office phone number, and the [facility] CM 24-hour nurse advice phone number should issues arise in the future.  Plan:  Will close [place] patient case, as she has successfully met her previously established goals and denies further care coordination needs, and will make patient's PCP aware of same.  [facility] CM Care Plan Problem Two     Most Recent Value  Care Plan Problem Two  Ongoing self-health management of frequent post-surgical wound infections, as evidenced by patient reporting  Role Documenting the Problem Two  Care Management Coordinator  Care Plan for Problem Two  Not Active  Interventions for Problem Two Long Term Goal   Discussed with patient her current clinical condition and confirmed that she has had no recent concerns around her condition,  ocnfirmed that home health services remain in place for wound vac management and confirmed that patient is able to verbalize signs/ symptoms infection along with corresponding action plan  THN Long Term Goal  Over the next 31 days, patient will promptly report any new concerns/ signs and symptoms of infection to care providers, as evidenced by patient reporting and collaboration with care providers as indicated during [place] outreach  THN Long Term Goal Start Date  07/26/18  [facility] Long Term Goal Met Date  08/30/18 [Goal Met]     It  has been a pleasure caring for Irven Baltimore, RN, BSN, [place] Coordinator [facility] Care Management  [phone]

## 2018-09-01 DIAGNOSIS — E1122 Type 2 diabetes mellitus with diabetic chronic kidney disease: Secondary | ICD-10-CM | POA: Diagnosis not present

## 2018-09-01 DIAGNOSIS — K5730 Diverticulosis of large intestine without perforation or abscess without bleeding: Secondary | ICD-10-CM | POA: Diagnosis not present

## 2018-09-01 DIAGNOSIS — E039 Hypothyroidism, unspecified: Secondary | ICD-10-CM | POA: Diagnosis not present

## 2018-09-01 DIAGNOSIS — I2510 Atherosclerotic heart disease of native coronary artery without angina pectoris: Secondary | ICD-10-CM | POA: Diagnosis not present

## 2018-09-01 DIAGNOSIS — I129 Hypertensive chronic kidney disease with stage 1 through stage 4 chronic kidney disease, or unspecified chronic kidney disease: Secondary | ICD-10-CM | POA: Diagnosis not present

## 2018-09-01 DIAGNOSIS — T8141XA Infection following a procedure, superficial incisional surgical site, initial encounter: Secondary | ICD-10-CM | POA: Diagnosis not present

## 2018-09-01 DIAGNOSIS — D631 Anemia in chronic kidney disease: Secondary | ICD-10-CM | POA: Diagnosis not present

## 2018-09-01 DIAGNOSIS — M1990 Unspecified osteoarthritis, unspecified site: Secondary | ICD-10-CM | POA: Diagnosis not present

## 2018-09-01 DIAGNOSIS — N183 Chronic kidney disease, stage 3 (moderate): Secondary | ICD-10-CM | POA: Diagnosis not present

## 2018-09-01 DIAGNOSIS — Z981 Arthrodesis status: Secondary | ICD-10-CM | POA: Diagnosis not present

## 2018-09-01 DIAGNOSIS — L02212 Cutaneous abscess of back [any part, except buttock]: Secondary | ICD-10-CM | POA: Diagnosis not present

## 2018-09-01 DIAGNOSIS — M48061 Spinal stenosis, lumbar region without neurogenic claudication: Secondary | ICD-10-CM | POA: Diagnosis not present

## 2018-09-01 DIAGNOSIS — E785 Hyperlipidemia, unspecified: Secondary | ICD-10-CM | POA: Diagnosis not present

## 2018-09-01 DIAGNOSIS — Z792 Long term (current) use of antibiotics: Secondary | ICD-10-CM | POA: Diagnosis not present

## 2018-09-01 DIAGNOSIS — Z9181 History of falling: Secondary | ICD-10-CM | POA: Diagnosis not present

## 2018-09-03 ENCOUNTER — Other Ambulatory Visit: Payer: Self-pay | Admitting: Internal Medicine

## 2018-09-03 DIAGNOSIS — R600 Localized edema: Secondary | ICD-10-CM

## 2018-09-03 DIAGNOSIS — Z981 Arthrodesis status: Secondary | ICD-10-CM | POA: Diagnosis not present

## 2018-09-03 DIAGNOSIS — D631 Anemia in chronic kidney disease: Secondary | ICD-10-CM | POA: Diagnosis not present

## 2018-09-03 DIAGNOSIS — T8141XA Infection following a procedure, superficial incisional surgical site, initial encounter: Secondary | ICD-10-CM | POA: Diagnosis not present

## 2018-09-03 DIAGNOSIS — E785 Hyperlipidemia, unspecified: Secondary | ICD-10-CM | POA: Diagnosis not present

## 2018-09-03 DIAGNOSIS — M1990 Unspecified osteoarthritis, unspecified site: Secondary | ICD-10-CM | POA: Diagnosis not present

## 2018-09-03 DIAGNOSIS — N183 Chronic kidney disease, stage 3 (moderate): Secondary | ICD-10-CM | POA: Diagnosis not present

## 2018-09-03 DIAGNOSIS — K5730 Diverticulosis of large intestine without perforation or abscess without bleeding: Secondary | ICD-10-CM | POA: Diagnosis not present

## 2018-09-03 DIAGNOSIS — L02212 Cutaneous abscess of back [any part, except buttock]: Secondary | ICD-10-CM | POA: Diagnosis not present

## 2018-09-03 DIAGNOSIS — M48061 Spinal stenosis, lumbar region without neurogenic claudication: Secondary | ICD-10-CM | POA: Diagnosis not present

## 2018-09-03 DIAGNOSIS — T8189XA Other complications of procedures, not elsewhere classified, initial encounter: Secondary | ICD-10-CM | POA: Diagnosis not present

## 2018-09-03 DIAGNOSIS — I2510 Atherosclerotic heart disease of native coronary artery without angina pectoris: Secondary | ICD-10-CM | POA: Diagnosis not present

## 2018-09-03 DIAGNOSIS — E039 Hypothyroidism, unspecified: Secondary | ICD-10-CM | POA: Diagnosis not present

## 2018-09-03 DIAGNOSIS — Z9181 History of falling: Secondary | ICD-10-CM | POA: Diagnosis not present

## 2018-09-03 DIAGNOSIS — E1122 Type 2 diabetes mellitus with diabetic chronic kidney disease: Secondary | ICD-10-CM | POA: Diagnosis not present

## 2018-09-03 DIAGNOSIS — I129 Hypertensive chronic kidney disease with stage 1 through stage 4 chronic kidney disease, or unspecified chronic kidney disease: Secondary | ICD-10-CM | POA: Diagnosis not present

## 2018-09-03 DIAGNOSIS — Z792 Long term (current) use of antibiotics: Secondary | ICD-10-CM | POA: Diagnosis not present

## 2018-09-03 MED ORDER — ATORVASTATIN CALCIUM 40 MG PO TABS: 40.0000 mg | ORAL_TABLET | Freq: Every day | ORAL | 3 refills | Status: AC

## 2018-09-03 MED ORDER — FUROSEMIDE 40 MG PO TABS: 40.0000 mg | ORAL_TABLET | Freq: Every day | ORAL | 1 refills | Status: DC

## 2018-09-06 DIAGNOSIS — Z981 Arthrodesis status: Secondary | ICD-10-CM | POA: Diagnosis not present

## 2018-09-06 DIAGNOSIS — E1122 Type 2 diabetes mellitus with diabetic chronic kidney disease: Secondary | ICD-10-CM | POA: Diagnosis not present

## 2018-09-06 DIAGNOSIS — L02212 Cutaneous abscess of back [any part, except buttock]: Secondary | ICD-10-CM | POA: Diagnosis not present

## 2018-09-06 DIAGNOSIS — T8141XA Infection following a procedure, superficial incisional surgical site, initial encounter: Secondary | ICD-10-CM | POA: Diagnosis not present

## 2018-09-06 DIAGNOSIS — K5730 Diverticulosis of large intestine without perforation or abscess without bleeding: Secondary | ICD-10-CM | POA: Diagnosis not present

## 2018-09-06 DIAGNOSIS — Z792 Long term (current) use of antibiotics: Secondary | ICD-10-CM | POA: Diagnosis not present

## 2018-09-06 DIAGNOSIS — I2510 Atherosclerotic heart disease of native coronary artery without angina pectoris: Secondary | ICD-10-CM | POA: Diagnosis not present

## 2018-09-06 DIAGNOSIS — N183 Chronic kidney disease, stage 3 (moderate): Secondary | ICD-10-CM | POA: Diagnosis not present

## 2018-09-06 DIAGNOSIS — M1990 Unspecified osteoarthritis, unspecified site: Secondary | ICD-10-CM | POA: Diagnosis not present

## 2018-09-06 DIAGNOSIS — D631 Anemia in chronic kidney disease: Secondary | ICD-10-CM | POA: Diagnosis not present

## 2018-09-06 DIAGNOSIS — E039 Hypothyroidism, unspecified: Secondary | ICD-10-CM | POA: Diagnosis not present

## 2018-09-06 DIAGNOSIS — M48061 Spinal stenosis, lumbar region without neurogenic claudication: Secondary | ICD-10-CM | POA: Diagnosis not present

## 2018-09-06 DIAGNOSIS — T8149XA Infection following a procedure, other surgical site, initial encounter: Secondary | ICD-10-CM | POA: Diagnosis not present

## 2018-09-06 DIAGNOSIS — I10 Essential (primary) hypertension: Secondary | ICD-10-CM | POA: Diagnosis not present

## 2018-09-06 DIAGNOSIS — Z9181 History of falling: Secondary | ICD-10-CM | POA: Diagnosis not present

## 2018-09-06 DIAGNOSIS — E785 Hyperlipidemia, unspecified: Secondary | ICD-10-CM | POA: Diagnosis not present

## 2018-09-06 DIAGNOSIS — I129 Hypertensive chronic kidney disease with stage 1 through stage 4 chronic kidney disease, or unspecified chronic kidney disease: Secondary | ICD-10-CM | POA: Diagnosis not present

## 2018-09-07 ENCOUNTER — Ambulatory Visit (INDEPENDENT_AMBULATORY_CARE_PROVIDER_SITE_OTHER): Payer: Medicare Other | Admitting: Internal Medicine

## 2018-09-07 ENCOUNTER — Encounter: Payer: Self-pay | Admitting: Internal Medicine

## 2018-09-07 VITALS — BP 140/84 | HR 60 | Temp 98.3°F | Ht 62.0 in | Wt 203.6 lb

## 2018-09-07 DIAGNOSIS — N183 Chronic kidney disease, stage 3 unspecified: Secondary | ICD-10-CM

## 2018-09-07 DIAGNOSIS — M25561 Pain in right knee: Secondary | ICD-10-CM

## 2018-09-07 DIAGNOSIS — E559 Vitamin D deficiency, unspecified: Secondary | ICD-10-CM

## 2018-09-07 DIAGNOSIS — E039 Hypothyroidism, unspecified: Secondary | ICD-10-CM | POA: Diagnosis not present

## 2018-09-07 DIAGNOSIS — M8580 Other specified disorders of bone density and structure, unspecified site: Secondary | ICD-10-CM

## 2018-09-07 DIAGNOSIS — Z23 Encounter for immunization: Secondary | ICD-10-CM | POA: Diagnosis not present

## 2018-09-07 DIAGNOSIS — Z1231 Encounter for screening mammogram for malignant neoplasm of breast: Secondary | ICD-10-CM

## 2018-09-07 DIAGNOSIS — M25562 Pain in left knee: Secondary | ICD-10-CM

## 2018-09-07 DIAGNOSIS — G8929 Other chronic pain: Secondary | ICD-10-CM

## 2018-09-07 DIAGNOSIS — I10 Essential (primary) hypertension: Secondary | ICD-10-CM | POA: Diagnosis not present

## 2018-09-07 DIAGNOSIS — Z1389 Encounter for screening for other disorder: Secondary | ICD-10-CM

## 2018-09-07 DIAGNOSIS — Z9889 Other specified postprocedural states: Secondary | ICD-10-CM

## 2018-09-07 DIAGNOSIS — N184 Chronic kidney disease, stage 4 (severe): Secondary | ICD-10-CM

## 2018-09-07 DIAGNOSIS — D649 Anemia, unspecified: Secondary | ICD-10-CM

## 2018-09-07 DIAGNOSIS — E7849 Other hyperlipidemia: Secondary | ICD-10-CM

## 2018-09-07 DIAGNOSIS — R7303 Prediabetes: Secondary | ICD-10-CM

## 2018-09-07 NOTE — Patient Instructions (Addendum)
Tylenol or Voltaren gel for knee pain   Vasoline or Cetaphil or Cerave Creams  Zyrtec or allegra or claritin regular not D with runny nose at night  If this does not work call me back will do a nose spray    Osteoarthritis  Osteoarthritis is a type of arthritis that affects tissue that covers the ends of bones in joints (cartilage). Cartilage acts as a cushion between the bones and helps them move smoothly. Osteoarthritis results when cartilage in the joints gets worn down. Osteoarthritis is sometimes called "wear and tear" arthritis. Osteoarthritis is the most common form of arthritis. It often occurs in older people. It is a condition that gets worse over time (a progressive condition). Joints that are most often affected by this condition are in:  Fingers.  Toes.  Hips.  Knees.  Spine, including neck and lower back. What are the causes? This condition is caused by age-related wearing down of cartilage that covers the ends of bones. What increases the risk? The following factors may make you more likely to develop this condition:  Older age.  Being overweight or obese.  Overuse of joints, such as in athletes.  Past injury of a joint.  Past surgery on a joint.  Family history of osteoarthritis. What are the signs or symptoms? The main symptoms of this condition are pain, swelling, and stiffness in the joint. The joint may lose its shape over time. Small pieces of bone or cartilage may break off and float inside of the joint, which may cause more pain and damage to the joint. Small deposits of bone (osteophytes) may grow on the edges of the joint. Other symptoms may include:  A grating or scraping feeling inside the joint when you move it.  Popping or creaking sounds when you move. Symptoms may affect one or more joints. Osteoarthritis in a major joint, such as your knee or hip, can make it painful to walk or exercise. If you have osteoarthritis in your hands, you might  not be able to grip items, twist your hand, or control small movements of your hands and fingers (fine motor skills). How is this diagnosed? This condition may be diagnosed based on:  Your medical history.  A physical exam.  Your symptoms.  X-rays of the affected joint(s).  Blood tests to rule out other types of arthritis. How is this treated? There is no cure for this condition, but treatment can help to control pain and improve joint function. Treatment plans may include:  A prescribed exercise program that allows for rest and joint relief. You may work with a physical therapist.  A weight control plan.  Pain relief techniques, such as: ? Applying heat and cold to the joint. ? Electric pulses delivered to nerve endings under the skin (transcutaneous electrical nerve stimulation, or TENS). ? Massage. ? Certain nutritional supplements.  NSAIDs or prescription medicines to help relieve pain.  Medicine to help relieve pain and inflammation (corticosteroids). This can be given by mouth (orally) or as an injection.  Assistive devices, such as a brace, wrap, splint, specialized glove, or cane.  Surgery, such as: ? An osteotomy. This is done to reposition the bones and relieve pain or to remove loose pieces of bone and cartilage. ? Joint replacement surgery. You may need this surgery if you have very bad (advanced) osteoarthritis. Follow these instructions at home: Activity  Rest your affected joints as directed by your health care provider.  Do not drive or use heavy machinery  while taking prescription pain medicine.  Exercise as directed. Your health care provider or physical therapist may recommend specific types of exercise, such as: ? Strengthening exercises. These are done to strengthen the muscles that support joints that are affected by arthritis. They can be performed with weights or with exercise bands to add resistance. ? Aerobic activities. These are exercises, such  as brisk walking or water aerobics, that get your heart pumping. ? Range-of-motion activities. These keep your joints easy to move. ? Balance and agility exercises. Managing pain, stiffness, and swelling      If directed, apply heat to the affected area as often as told by your health care provider. Use the heat source that your health care provider recommends, such as a moist heat pack or a heating pad. ? If you have a removable assistive device, remove it as told by your health care provider. ? Place a towel between your skin and the heat source. If your health care provider tells you to keep the assistive device on while you apply heat, place a towel between the assistive device and the heat source. ? Leave the heat on for 20-30 minutes. ? Remove the heat if your skin turns bright red. This is especially important if you are unable to feel pain, heat, or cold. You may have a greater risk of getting burned.  If directed, put ice on the affected joint: ? If you have a removable assistive device, remove it as told by your health care provider. ? Put ice in a plastic bag. ? Place a towel between your skin and the bag. If your health care provider tells you to keep the assistive device on during icing, place a towel between the assistive device and the bag. ? Leave the ice on for 20 minutes, 2-3 times a day. General instructions  Take over-the-counter and prescription medicines only as told by your health care provider.  Maintain a healthy weight. Follow instructions from your health care provider for weight control. These may include dietary restrictions.  Do not use any products that contain nicotine or tobacco, such as cigarettes and e-cigarettes. These can delay bone healing. If you need help quitting, ask your health care provider.  Use assistive devices as directed by your health care provider.  Keep all follow-up visits as told by your health care provider. This is important. Where  to find more information  [name] of Arthritis and Musculoskeletal and Skin Diseases: www.niams.[employer].es  [name] on Aging: http://kim-miller.com/  [employer] Rheumat[employer]: www.rheumatology.org Contact a health care provider if:  Your skin turns red.  You develop a rash.  You have pain that gets worse.  You have a fever along with joint or muscle aches. Get help right away if:  You lose a lot of weight.  You suddenly lose your appetite.  You have night sweats. Summary  Osteoarthritis is a type of arthritis that affects tissue covering the ends of bones in joints (cartilage).  This condition is caused by age-related wearing down of cartilage that covers the ends of bones.  The main symptom of this condition is pain, swelling, and stiffness in the joint.  There is no cure for this condition, but treatment can help to control pain and improve joint function. This information is not intended to replace advice given to you by your health care provider. Make sure you discuss any questions you have with your health care provider. Document Released: 07/21/2005 Document Revised: 04/27/2017 Document Reviewed: 03/24/2016  Chartered certified accountant Patient Education  [employer].

## 2018-09-07 NOTE — Progress Notes (Addendum)
Chief Complaint  Patient presents with  . Follow-up   F/u  1. HFU in hospital 11/2-11/04/2018 for chronic L spine infection wound culture +proteus s/p L spine surgery 02/2017 by Dr. Cyndy Freeze and had abscess/infection, AKI on CK -today s/p spine surgery is healing with mild yellow drainage from the wound and she reports wound care 3x per week and no longer using wound vac.  2. HTN sl elevated called and spoke with renal Dr. Johnney Ou pt is on coreg 25 mg bid, norvasc 10 mg qd, clonidine 0.1 bid, lasix 40 mg bid, hydralazine 50 bid, Imdur 30 qd  3. Hypothyroidism on synthyroid 112 mcg  4. CKD had appt 08/11/2018 with [facility] in [place] Dr. Johnney Ou and has f/u 10/2018 disc with Dr. Johnney Ou who is considering kidney bx and wanted pt to have one ~6 +months ago  5. C/o arthritis in knees she has Voltaren gel at home   Review of Systems  Constitutional: Negative for weight loss.  HENT: Negative for hearing loss.   Eyes: Negative for blurred vision.  Respiratory: Negative for shortness of breath.   Cardiovascular: Positive for leg swelling. Negative for chest pain.  Gastrointestinal: Negative for abdominal pain.  Musculoskeletal: Positive for joint pain.  Skin: Negative for rash.  Neurological: Negative for headaches.  Psychiatric/Behavioral: Negative for depression.   Past Medical History:  Diagnosis Date  . Acute blood loss anemia 03/14/2017  . Acute renal failure superimposed on stage 3 chronic kidney disease ([place]) 03/14/2017  . AKI (acute kidney injury) ([place]) 02/28/2017  . Altered mental status 03/29/2017  . Anemia   . Aortic atherosclerosis ([place])   . Arthritis    "joints might ache at times; not that bad" (06/10/2018)  . Bilateral lower extremity edema 03/04/2017  . Bradycardia   . Chronic kidney disease    ?? renal insufficiency,   . CKD (chronic kidney disease) stage 3, GFR 30-59 ml/min (H[facility]) 01/15/2017   ?? renal insufficiency, which she thinks is coming from "all these medications"  .  Diabetes mellitus without complication ([place])    diagnosed 4-5 yrs ago, 06/21/18- "that was years ago"  . Disease of pancreas   . Diverticulitis    s/p perforation and partial colectomy 01/27/14 with 3 benign lymph nodes   . Diverticulosis 01/15/2017  . DJD (degenerative joint disease)   . DM (diabetes mellitus), type 2 with renal complications ([place]) 01/06/5408  . Fatty liver   . Hypertension   . Hypothyroidism    "had radiation" (06/10/2018)  . Kidney stone   . Lethargy 02/28/2017  . Obesity, Class III, BMI 40-49.9 (morbid obesity) ([place]) 02/27/2017  . Pleural lipoma   . Postoperative wound infection 04/02/2017  . Spinal stenosis of lumbar region 01/15/2017  . Status post lumbar surgery   . Wound healing, delayed    back   Past Surgical History:  Procedure Laterality Date  . ABDOMINAL EXPOSURE N/A 02/24/2017   Procedure: ABDOMINAL EXPOSURE;  Surgeon: Angelia Mould, MD;  Location: [facility];  Service: Vascular;  Laterality: N/A;  . ABDOMINAL HYSTERECTOMY  1987   no h/o abnormal paps   . ANTERIOR LAT LUMBAR FUSION N/A 02/24/2017   Procedure: Lumbar three- five Anterior lateral lumbar interbody fusion;  Surgeon: Ditty, Kevan Ny, MD;  Location: [facility];  Service: Neurosurgery;  Laterality: N/A;  L3-5 Anterior lateral lumbar interbody fusion with removal of coflex at L3-4, L4-5  . ANTERIOR LUMBAR FUSION N/A 02/24/2017   Procedure: Stage 1: Lumbar five-Sacral one Anterior lumbar interbody  fusion;  Surgeon: Ditty, Kevan Ny, MD;  Location: [facility];  Service: Neurosurgery;  Laterality: N/A;  Stage 1: L5-S1 Anterior lumbar interbody fusion  . APPENDECTOMY    . APPLICATION OF ROBOTIC ASSISTANCE FOR SPINAL PROCEDURE N/A 02/26/2017   Procedure: APPLICATION OF ROBOTIC ASSISTANCE FOR SPINAL PROCEDURE;  Surgeon: Ditty, Kevan Ny, MD;  Location: [facility];  Service: Neurosurgery;  Laterality: N/A;  . APPLICATION OF WOUND VAC N/A 03/30/2017   Procedure: APPLICATION OF WOUND VAC;  Surgeon: Ditty,  Kevan Ny, MD;  Location: [facility];  Service: Neurosurgery;  Laterality: N/A;  . BACK SURGERY  2013  . CATARACT EXTRACTION W/ INTRAOCULAR LENS IMPLANT Right   . CHOLECYSTECTOMY OPEN  1982  . COLON SURGERY  01/26/2014   desc.sigmoid colectomy and ventral hernia repair and splenic flexure mobilization  . COLON SURGERY    . DILATION AND CURETTAGE OF UTERUS    . HERNIA REPAIR    . LUMBAR LAMINECTOMY WITH SPINOUS PROCESS PLATE 2 LEVEL N/A 11/15/2438   Procedure: LUMBAR LAMINECTOMY/DECOMPRESSION MICRODISCECTOMY CoFlex;  Surgeon: Faythe Ghee, MD;  Location: [facility];  Service: Neurosurgery;  Laterality: N/A;  Lumbar three-four,Lumbar Four-Five Laminectomy with Coflex  . LUMBAR WOUND DEBRIDEMENT N/A 03/30/2017   Procedure: Lumbar wound exploration/debridement, placement of wound vac;  Surgeon: Ditty, Kevan Ny, MD;  Location: [facility];  Service: Neurosurgery;  Laterality: N/A;  Lumbar wound exploration/debridement, placement of wound vac  . LUMBAR WOUND DEBRIDEMENT N/A 06/06/2018   Procedure: LUMBAR WOUND DEBRIDEMENT/EXPLORATION;  Surgeon: Consuella Lose, MD;  Location: [facility];  Service: Neurosurgery;  Laterality: N/A;  . LUMBAR WOUND DEBRIDEMENT N/A 06/22/2018   Procedure: SIMPLE INCISION AND DRAINAGE OF WOUND, APPLICATION OF WOUND VAC;  Surgeon: Consuella Lose, MD;  Location: [facility];  Service: Neurosurgery;  Laterality: N/A;  . PARTIAL COLECTOMY     01/27/14 diverticulitis and 3 benign lymph nodes [facility] Dr. Pat Patrick   . REDUCTION MAMMAPLASTY Bilateral 1992  . TUBAL LIGATION    . VENTRAL HERNIA REPAIR  2014   Family History  Problem Relation Age of Onset  . Diabetes Father   . Heart attack Father   . Hypertension Father   . Heart disease Father   . Kidney disease Father   . Diabetes Mother   . Hypertension Mother   . Heart disease Mother   . Kidney disease Mother   . Lupus Sister   . Heart disease Brother   . Diabetes Brother   . Heart disease Brother   . Diabetes Brother   .  Lupus Sister    Social History   Socioeconomic History  . Marital status: Widowed    Spouse name: Not on file  . Number of children: Not on file  . Years of education: Not on file  . Highest education level: Not on file  Occupational History  . Occupation: retired Economist work  Scientific laboratory technician  . Financial resource strain: Not hard at all  . Food insecurity:    Worry: Never true    Inability: Never true  . Transportation needs:    Medical: No    Non-medical: No  Tobacco Use  . Smoking status: Never Smoker  . Smokeless tobacco: Never Used  Substance and Sexual Activity  . Alcohol use: Not Currently    Frequency: Never    Comment: "nothing since age 30" (06/10/2018)  . Drug use: Never  . Sexual activity: Not Currently  Lifestyle  . Physical activity:    Days per week: 0 days  Minutes per session: Not on file  . Stress: Not on file  Relationships  . Social connections:    Talks on phone: Not on file    Gets together: Not on file    Attends religious service: Not on file    Active member of club or organization: Not on file    Attends meetings of clubs or organizations: Not on file    Relationship status: Not on file  . Intimate partner violence:    Fear of current or ex partner: Not on file    Emotionally abused: Not on file    Physically abused: Not on file    Forced sexual activity: Not on file  Other Topics Concern  . Not on file  Social History Narrative   Admitted to [facility] 03/03/17   Widowed since 2012    2 sons [name] comes to appts)    Never smoked   Alcohol none   Full Code   12th grade education retired    Occupational hygienist    Current Meds  Medication Sig  . acetaminophen (TYLENOL) 500 MG tablet Take 1,000 mg by mouth 3 (three) times daily as needed for mild pain.  Marland Kitchen amLODipine (NORVASC) 10 MG tablet Take 1 tablet (10 mg total) by mouth daily.  Marland Kitchen atorvastatin (LIPITOR) 40 MG tablet Take 1 tablet (40 mg total) by mouth daily at 6 PM.  .  carvedilol (COREG) 25 MG tablet Take 1 tablet (25 mg total) by mouth 2 (two) times daily with a meal.  . cholecalciferol (VITAMIN D) 1000 units tablet Take 1,000 Units by mouth daily.  . cloNIDine (CATAPRES) 0.1 MG tablet Take 0.1 mg by mouth 2 (two) times daily.   . ferrous sulfate 325 (65 FE) MG tablet Take 325 mg by mouth daily with breakfast.  . furosemide (LASIX) 40 MG tablet Take 1 tablet (40 mg total) by mouth daily. In am  . gabapentin (NEURONTIN) 300 MG capsule Take 300 mg by mouth at bedtime as needed (pain).  . hydrALAZINE (APRESOLINE) 50 MG tablet Take 1 tablet (50 mg total) by mouth every 8 (eight) hours.  . isosorbide mononitrate (IMDUR) 30 MG 24 hr tablet Take 1 tablet (30 mg total) by mouth daily.  Marland Kitchen levothyroxine (SYNTHROID, LEVOTHROID) 112 MCG tablet Take 112 mcg by mouth daily before breakfast.  . magnesium oxide (MAG-OX) 400 MG tablet Take 400 mg by mouth daily.  . vitamin C (ASCORBIC ACID) 500 MG tablet Take 500 mg by mouth daily.  . [DISCONTINUED] diclofenac sodium (VOLTAREN) 1 % GEL Apply 4 g topically 4 (four) times daily as needed (knees).  . [DISCONTINUED] docusate sodium (COLACE) 100 MG capsule Take 1 capsule (100 mg total) by mouth daily.  . [DISCONTINUED] senna (SENOKOT) 8.6 MG TABS tablet Take 1 tablet (8.6 mg total) by mouth daily.  . [DISCONTINUED] traMADol (ULTRAM) 50 MG tablet Take 1 tablet (50 mg total) by mouth every 12 (twelve) hours as needed.   Allergies  Allergen Reactions  . Clams [Shellfish Allergy] Swelling and Other (See Comments)    THROAT SWELLS NECK TURNS RED  . Hydralazine Other (See Comments)    CHEST TIGHTNESS Patient has tolerated multiple doses of hydralazine since allergy was listed   . Norvasc [Amlodipine Besylate] Swelling    Leg edema   . Milk-Related Compounds Diarrhea   Recent Results (from the past 2160 hour(s))  CBC     Status: Abnormal   Collection Time: 06/11/18  3:57 AM  Result Value Ref Range   WBC 13.3 (H) 4.0 - 10.5  K/uL   RBC 3.44 (L) 3.87 - 5.11 MIL/uL   Hemoglobin 8.7 (L) 12.0 - 15.0 g/dL   HCT 29.1 (L) 36.0 - 46.0 %   MCV 84.6 80.0 - 100.0 fL   MCH 25.3 (L) 26.0 - 34.0 pg   MCHC 29.9 (L) 30.0 - 36.0 g/dL   RDW 16.4 (H) 11.5 - 15.5 %   Platelets 397 150 - 400 K/uL   nRBC 0.0 0.0 - 0.2 %    Comment: Performed at [facility] [address] [place], [place] [address]  Basic metabolic panel     Status: Abnormal   Collection Time: 06/11/18  3:57 AM  Result Value Ref Range   Sodium 136 135 - 145 mmol/L   Potassium 4.4 3.5 - 5.1 mmol/L   Chloride 105 98 - 111 mmol/L   CO2 25 22 - 32 mmol/L   Glucose, Bld 113 (H) 70 - 99 mg/dL   BUN 48 (H) 8 - 23 mg/dL   Creatinine, Ser 2.04 (H) 0.44 - 1.00 mg/dL   Calcium 8.7 (L) 8.9 - 10.3 mg/dL   GFR calc non Af Amer 23 (L) >60 mL/min   GFR calc Af Amer 26 (L) >60 mL/min    Comment: (NOTE) The eGFR has been calculated using the CKD EPI equation. This calculation has not been validated in all clinical situations. eGFR's persistently <60 mL/min signify possible Chronic Kidney Disease.    Anion gap 6 5 - 15    Comment: Performed at [facility] [address] [place], [place] [address]  Glucose, capillary     Status: None   Collection Time: 06/22/18  8:56 AM  Result Value Ref Range   Glucose-Capillary 96 70 - 99 mg/dL  CBC     Status: Abnormal   Collection Time: 06/22/18  9:11 AM  Result Value Ref Range   WBC 7.2 4.0 - 10.5 K/uL   RBC 3.74 (L) 3.87 - 5.11 MIL/uL   Hemoglobin 9.4 (L) 12.0 - 15.0 g/dL   HCT 32.2 (L) 36.0 - 46.0 %   MCV 86.1 80.0 - 100.0 fL   MCH 25.1 (L) 26.0 - 34.0 pg   MCHC 29.2 (L) 30.0 - 36.0 g/dL   RDW 17.7 (H) 11.5 - 15.5 %   Platelets 539 (H) 150 - 400 K/uL   nRBC 0.0 0.0 - 0.2 %    Comment: Performed at [facility], [place] [address], [place], [place] [address]  Basic metabolic panel     Status: Abnormal   Collection Time: 06/22/18  9:11 AM  Result Value Ref Range   Sodium 140 135 - 145 mmol/L    Potassium 4.0 3.5 - 5.1 mmol/L   Chloride 108 98 - 111 mmol/L   CO2 25 22 - 32 mmol/L   Glucose, Bld 106 (H) 70 - 99 mg/dL   BUN 25 (H) 8 - 23 mg/dL   Creatinine, Ser 1.66 (H) 0.44 - 1.00 mg/dL   Calcium 9.1 8.9 - 10.3 mg/dL   GFR calc non Af Amer 29 (L) >60 mL/min   GFR calc Af Amer 34 (L) >60 mL/min    Comment: (NOTE) The eGFR has been calculated using the CKD EPI equation. This calculation has not been validated in all clinical situations. eGFR's persistently <60 mL/min signify possible Chronic Kidney Disease.    Anion gap 7 5 - 15    Comment: Performed  at [facility], [place] [address], [place], [place] [address]  Glucose, capillary     Status: None   Collection Time: 06/22/18 12:08 PM  Result Value Ref Range   Glucose-Capillary 80 70 - 99 mg/dL   Comment 1 Notify RN    Objective  Body mass index is 37.24 kg/m. Wt Readings from Last 3 Encounters:  09/07/18 203 lb 9.6 oz (92.4 kg)  06/22/18 210 lb (95.3 kg)  06/05/18 233 lb 11 oz (106 kg)   Temp Readings from Last 3 Encounters:  09/07/18 98.3 F (36.8 C) (Oral)  06/22/18 (!) 97.3 F (36.3 C)  06/12/18 (!) 97.5 F (36.4 C) (Oral)   BP Readings from Last 3 Encounters:  09/07/18 140/84  06/22/18 (!) 160/65  06/12/18 (!) 158/67   Pulse Readings from Last 3 Encounters:  09/07/18 60  06/22/18 65  06/12/18 (!) 57    Physical Exam Vitals signs and nursing note reviewed.  Constitutional:      Appearance: Normal appearance. She is well-developed and well-groomed. She is obese.  HENT:     Head: Normocephalic and atraumatic.     Nose: Nose normal.     Mouth/Throat:     Mouth: Mucous membranes are moist.     Pharynx: Oropharynx is clear.  Eyes:     Conjunctiva/sclera: Conjunctivae normal.     Pupils: Pupils are equal, round, and reactive to light.  Cardiovascular:     Rate and Rhythm: Normal rate and regular rhythm.     Heart sounds: Normal heart sounds. No murmur.  Pulmonary:     Effort: Pulmonary effort  is normal.     Breath sounds: Normal breath sounds.  Musculoskeletal:     Right lower leg: 2+ Pitting Edema present.     Left lower leg: 2+ Pitting Edema present.  Skin:    General: Skin is warm and dry.     Comments: Low back wound with yellow drainage    Neurological:     General: No focal deficit present.     Mental Status: She is alert and oriented to person, place, and time. Mental status is at baseline.     Gait: Gait normal.     Comments: Using cane to walk today    Psychiatric:        Attention and Perception: Attention and perception normal.        Mood and Affect: Mood and affect normal.        Speech: Speech normal.        Behavior: Behavior normal. Behavior is cooperative.        Thought Content: Thought content normal.        Cognition and Memory: Cognition and memory normal.        Judgment: Judgment normal.     Assessment   1. Proteus infection s/p debridement 06/2018 2nd time back has been infected s/p surgery 06/2018 and 02/2017 with mild yellow drainage today  2. HTN 3. Hypothyroidism  4. CKD 3/4  Seem 09/20/18 renal appt BP 210/82 f/u in 2 months BUN 39, Cr 2.22 EGFR 24 Alb 3.1 will rec bx, change meds later this week to disc labs and bx hbg was 04/09/27/9 Dr. Johnney Ou   5. Knee arthritis  6. HM Plan   1. F/u [facility] in [place]  09/06/18 note [facility] pt on amoxicillin 500 mg #30 due to wound drainage BP was 223/73 will let Dr. Johnney Ou renal know as she is to help with control  of BP with CKD Wound care 3x per week at home  Changed dressing today  2. Per renal on norvasc 10 mg, coreg 25 mg bid, clonidine 0.1 bid, lasix 40 mg bid (if leg edema continues rec 60 bid x 3 days but I am c/w renal function), hyradralazine 50 mg bid, imdur 30 mg qd  3. Cont meds  Check labs upcoming 09/23/2018  4. F/u [facility] Dr. Johnney Ou considering bx or kidneys  5. voltaren gel and tylenol prn  6.  Flu shot [facility]  Tdaphad 08/08/08 (given Rx tdap today), pna [id number],08/08/08,  updated pna 23 today prevnar,zostavax had 08/15/08 -shingrix, hep A/B (h/o fatty liver)consider in future   mammo1/17/19 negordered for this year not sch due to #1  Colonoscopy had 2015 Dr. Pat Patrick [facility] need to get recordsor review [place] recordsDr. Bary Castilla 2013 -see report 01/27/14 diverticulitis consider repeat colonoscopy 2020 vs cologaurd in future Out of age window papsp hysterectomy 1987 DEXAper Dr. Ronnald Collum 05/14/15 and 01/2017 unable to read requested mailed copy h/o osteopenia/osteoporosis  -saw copy 05/14/15 mild osteopenia left femoral neck -consider repeat DEXA in future3-5 years  Hep C and HIV neg 04/12/18   Saw renal [facility] Dr. Johnney Ou 10/19/2018  On norvasc 10 mg qd, hyralazine 50 tid, lasix 40 mg qd, labetalol 100 mg bid BP was 188/76 HR 68  +ANA renal bx still pending  Saw renal 11/18/2018 changed to coreg 6.25 mg bid d/c labetalol rtc in 6 weeks hold renal bx no tx for +ANA until off Abx  hbg 9.9 11/18/2018   Renal 12/20/2018 BP was 156/68 HR 58 coreg 6.25 bid, hydralazine 100 tid, clonidine 0.1 bid, norvasc 10 mg qd  HTN CKD 4 with proteinuria  Anemia 2ndary hyperparathyroidism   02/16/2019 saw Dr. Kathyrn Sheriff and Dr. Earle Gell [facility] they are doing repeat imaging to see how big SQ pocket may be and if deeper infection she did see Dr. Prince Rome ID as well MRI spine with and w/o contrast ordered    03/09/19 Dr. Johnney Ou appt renal in [place] BP 130/64 HR 68 wt 226  CKD 4/5 Cr 3.28 GFR 15 phos 5.1 elevated albumin 3.0 low K 5.0  On lasix 80 mg bid x 3-5 days  norvasc 10 mg qd Hydralazine 100 mg tid  Clonidine 0.1 bid  Coreg 6.25 mg bid  Amoxicillin 500 mg bid  Levo 100 mcg qd  Gabapentin 300 mg qhs  Hold on kidney biopsy due to healing back wound and on Abx  Hemoglobin 7.2 hct 22.6 low ferritin 680 elevated tibc 208 low, iron 45 normal iron sat 22 low normal, PTH 115 elevated    04/05/2019 Dr. Johnney Ou No renal bx yet due to back wound   Anemia and 2ndary hperparathyroidism  Lasix 80 bid stopped and start torsemide 60 bid x 3 days then qd   HTN BP improved 130/46 clonidine 0.1 bid, coreg 6.25 bid, norvasc 10, hyralazine 100 tid, torsemide   CKD4 prep for HD f/u in 2 months   07/18/2019 1 month s/p lumbar debridement, removal lumbar hardware and wound vac in SNF currently [facility] vincent costella PAC  [facility] 09/19/19 chronic lumbar wound dehiscence and wound infection wound check refuses wound vac  Placement wound packed. BP 134/74 pain 7/10  10/17/19 chronic lumbar wound infection and wound dehiscence refuses wound vac no signs of infection heal via 2/2 infection hopefully f/u in 2 months [facility] Provider: Dr. Olivia Mackie Mc[facility]

## 2018-09-07 NOTE — Progress Notes (Signed)
Pre visit review using our clinic review tool, if applicable. No additional management support is needed unless otherwise documented below in the visit note. 

## 2018-09-08 DIAGNOSIS — N183 Chronic kidney disease, stage 3 (moderate): Secondary | ICD-10-CM | POA: Diagnosis not present

## 2018-09-08 DIAGNOSIS — Z792 Long term (current) use of antibiotics: Secondary | ICD-10-CM | POA: Diagnosis not present

## 2018-09-08 DIAGNOSIS — E785 Hyperlipidemia, unspecified: Secondary | ICD-10-CM | POA: Diagnosis not present

## 2018-09-08 DIAGNOSIS — I2510 Atherosclerotic heart disease of native coronary artery without angina pectoris: Secondary | ICD-10-CM | POA: Diagnosis not present

## 2018-09-08 DIAGNOSIS — E039 Hypothyroidism, unspecified: Secondary | ICD-10-CM | POA: Diagnosis not present

## 2018-09-08 DIAGNOSIS — L02212 Cutaneous abscess of back [any part, except buttock]: Secondary | ICD-10-CM | POA: Diagnosis not present

## 2018-09-08 DIAGNOSIS — T8141XA Infection following a procedure, superficial incisional surgical site, initial encounter: Secondary | ICD-10-CM | POA: Diagnosis not present

## 2018-09-08 DIAGNOSIS — D631 Anemia in chronic kidney disease: Secondary | ICD-10-CM | POA: Diagnosis not present

## 2018-09-08 DIAGNOSIS — E1122 Type 2 diabetes mellitus with diabetic chronic kidney disease: Secondary | ICD-10-CM | POA: Diagnosis not present

## 2018-09-08 DIAGNOSIS — I129 Hypertensive chronic kidney disease with stage 1 through stage 4 chronic kidney disease, or unspecified chronic kidney disease: Secondary | ICD-10-CM | POA: Diagnosis not present

## 2018-09-08 DIAGNOSIS — Z981 Arthrodesis status: Secondary | ICD-10-CM | POA: Diagnosis not present

## 2018-09-08 DIAGNOSIS — M48061 Spinal stenosis, lumbar region without neurogenic claudication: Secondary | ICD-10-CM | POA: Diagnosis not present

## 2018-09-08 DIAGNOSIS — K5730 Diverticulosis of large intestine without perforation or abscess without bleeding: Secondary | ICD-10-CM | POA: Diagnosis not present

## 2018-09-08 DIAGNOSIS — M1990 Unspecified osteoarthritis, unspecified site: Secondary | ICD-10-CM | POA: Diagnosis not present

## 2018-09-08 DIAGNOSIS — Z9181 History of falling: Secondary | ICD-10-CM | POA: Diagnosis not present

## 2018-09-10 DIAGNOSIS — T8141XA Infection following a procedure, superficial incisional surgical site, initial encounter: Secondary | ICD-10-CM | POA: Diagnosis not present

## 2018-09-10 DIAGNOSIS — M48061 Spinal stenosis, lumbar region without neurogenic claudication: Secondary | ICD-10-CM | POA: Diagnosis not present

## 2018-09-10 DIAGNOSIS — N183 Chronic kidney disease, stage 3 (moderate): Secondary | ICD-10-CM | POA: Diagnosis not present

## 2018-09-10 DIAGNOSIS — K5730 Diverticulosis of large intestine without perforation or abscess without bleeding: Secondary | ICD-10-CM | POA: Diagnosis not present

## 2018-09-10 DIAGNOSIS — E785 Hyperlipidemia, unspecified: Secondary | ICD-10-CM | POA: Diagnosis not present

## 2018-09-10 DIAGNOSIS — I129 Hypertensive chronic kidney disease with stage 1 through stage 4 chronic kidney disease, or unspecified chronic kidney disease: Secondary | ICD-10-CM | POA: Diagnosis not present

## 2018-09-10 DIAGNOSIS — Z9181 History of falling: Secondary | ICD-10-CM | POA: Diagnosis not present

## 2018-09-10 DIAGNOSIS — D631 Anemia in chronic kidney disease: Secondary | ICD-10-CM | POA: Diagnosis not present

## 2018-09-10 DIAGNOSIS — E1122 Type 2 diabetes mellitus with diabetic chronic kidney disease: Secondary | ICD-10-CM | POA: Diagnosis not present

## 2018-09-10 DIAGNOSIS — Z981 Arthrodesis status: Secondary | ICD-10-CM | POA: Diagnosis not present

## 2018-09-10 DIAGNOSIS — E039 Hypothyroidism, unspecified: Secondary | ICD-10-CM | POA: Diagnosis not present

## 2018-09-10 DIAGNOSIS — I2510 Atherosclerotic heart disease of native coronary artery without angina pectoris: Secondary | ICD-10-CM | POA: Diagnosis not present

## 2018-09-10 DIAGNOSIS — L02212 Cutaneous abscess of back [any part, except buttock]: Secondary | ICD-10-CM | POA: Diagnosis not present

## 2018-09-10 DIAGNOSIS — M1990 Unspecified osteoarthritis, unspecified site: Secondary | ICD-10-CM | POA: Diagnosis not present

## 2018-09-10 DIAGNOSIS — Z792 Long term (current) use of antibiotics: Secondary | ICD-10-CM | POA: Diagnosis not present

## 2018-09-13 ENCOUNTER — Encounter: Payer: Self-pay | Admitting: Internal Medicine

## 2018-09-13 DIAGNOSIS — T8141XA Infection following a procedure, superficial incisional surgical site, initial encounter: Secondary | ICD-10-CM | POA: Diagnosis not present

## 2018-09-14 DIAGNOSIS — Z981 Arthrodesis status: Secondary | ICD-10-CM | POA: Diagnosis not present

## 2018-09-14 DIAGNOSIS — Z9181 History of falling: Secondary | ICD-10-CM | POA: Diagnosis not present

## 2018-09-14 DIAGNOSIS — L02212 Cutaneous abscess of back [any part, except buttock]: Secondary | ICD-10-CM | POA: Diagnosis not present

## 2018-09-14 DIAGNOSIS — M1990 Unspecified osteoarthritis, unspecified site: Secondary | ICD-10-CM | POA: Diagnosis not present

## 2018-09-14 DIAGNOSIS — I129 Hypertensive chronic kidney disease with stage 1 through stage 4 chronic kidney disease, or unspecified chronic kidney disease: Secondary | ICD-10-CM | POA: Diagnosis not present

## 2018-09-14 DIAGNOSIS — M48061 Spinal stenosis, lumbar region without neurogenic claudication: Secondary | ICD-10-CM | POA: Diagnosis not present

## 2018-09-14 DIAGNOSIS — T8141XA Infection following a procedure, superficial incisional surgical site, initial encounter: Secondary | ICD-10-CM | POA: Diagnosis not present

## 2018-09-14 DIAGNOSIS — N183 Chronic kidney disease, stage 3 (moderate): Secondary | ICD-10-CM | POA: Diagnosis not present

## 2018-09-14 DIAGNOSIS — I2510 Atherosclerotic heart disease of native coronary artery without angina pectoris: Secondary | ICD-10-CM | POA: Diagnosis not present

## 2018-09-14 DIAGNOSIS — Z792 Long term (current) use of antibiotics: Secondary | ICD-10-CM | POA: Diagnosis not present

## 2018-09-14 DIAGNOSIS — D631 Anemia in chronic kidney disease: Secondary | ICD-10-CM | POA: Diagnosis not present

## 2018-09-14 DIAGNOSIS — E1122 Type 2 diabetes mellitus with diabetic chronic kidney disease: Secondary | ICD-10-CM | POA: Diagnosis not present

## 2018-09-14 DIAGNOSIS — E039 Hypothyroidism, unspecified: Secondary | ICD-10-CM | POA: Diagnosis not present

## 2018-09-14 DIAGNOSIS — K5730 Diverticulosis of large intestine without perforation or abscess without bleeding: Secondary | ICD-10-CM | POA: Diagnosis not present

## 2018-09-14 DIAGNOSIS — E785 Hyperlipidemia, unspecified: Secondary | ICD-10-CM | POA: Diagnosis not present

## 2018-09-15 DIAGNOSIS — E039 Hypothyroidism, unspecified: Secondary | ICD-10-CM | POA: Diagnosis not present

## 2018-09-15 DIAGNOSIS — E785 Hyperlipidemia, unspecified: Secondary | ICD-10-CM | POA: Diagnosis not present

## 2018-09-15 DIAGNOSIS — N183 Chronic kidney disease, stage 3 (moderate): Secondary | ICD-10-CM | POA: Diagnosis not present

## 2018-09-15 DIAGNOSIS — K5730 Diverticulosis of large intestine without perforation or abscess without bleeding: Secondary | ICD-10-CM | POA: Diagnosis not present

## 2018-09-15 DIAGNOSIS — I129 Hypertensive chronic kidney disease with stage 1 through stage 4 chronic kidney disease, or unspecified chronic kidney disease: Secondary | ICD-10-CM | POA: Diagnosis not present

## 2018-09-15 DIAGNOSIS — L02212 Cutaneous abscess of back [any part, except buttock]: Secondary | ICD-10-CM | POA: Diagnosis not present

## 2018-09-15 DIAGNOSIS — M48061 Spinal stenosis, lumbar region without neurogenic claudication: Secondary | ICD-10-CM | POA: Diagnosis not present

## 2018-09-15 DIAGNOSIS — M1990 Unspecified osteoarthritis, unspecified site: Secondary | ICD-10-CM | POA: Diagnosis not present

## 2018-09-15 DIAGNOSIS — Z9181 History of falling: Secondary | ICD-10-CM | POA: Diagnosis not present

## 2018-09-15 DIAGNOSIS — Z792 Long term (current) use of antibiotics: Secondary | ICD-10-CM | POA: Diagnosis not present

## 2018-09-15 DIAGNOSIS — T8141XA Infection following a procedure, superficial incisional surgical site, initial encounter: Secondary | ICD-10-CM | POA: Diagnosis not present

## 2018-09-15 DIAGNOSIS — E1122 Type 2 diabetes mellitus with diabetic chronic kidney disease: Secondary | ICD-10-CM | POA: Diagnosis not present

## 2018-09-15 DIAGNOSIS — D631 Anemia in chronic kidney disease: Secondary | ICD-10-CM | POA: Diagnosis not present

## 2018-09-15 DIAGNOSIS — I2510 Atherosclerotic heart disease of native coronary artery without angina pectoris: Secondary | ICD-10-CM | POA: Diagnosis not present

## 2018-09-15 DIAGNOSIS — Z981 Arthrodesis status: Secondary | ICD-10-CM | POA: Diagnosis not present

## 2018-09-17 DIAGNOSIS — N183 Chronic kidney disease, stage 3 (moderate): Secondary | ICD-10-CM | POA: Diagnosis not present

## 2018-09-17 DIAGNOSIS — Z981 Arthrodesis status: Secondary | ICD-10-CM | POA: Diagnosis not present

## 2018-09-17 DIAGNOSIS — E1122 Type 2 diabetes mellitus with diabetic chronic kidney disease: Secondary | ICD-10-CM | POA: Diagnosis not present

## 2018-09-17 DIAGNOSIS — I2510 Atherosclerotic heart disease of native coronary artery without angina pectoris: Secondary | ICD-10-CM | POA: Diagnosis not present

## 2018-09-17 DIAGNOSIS — L02212 Cutaneous abscess of back [any part, except buttock]: Secondary | ICD-10-CM | POA: Diagnosis not present

## 2018-09-17 DIAGNOSIS — E039 Hypothyroidism, unspecified: Secondary | ICD-10-CM | POA: Diagnosis not present

## 2018-09-17 DIAGNOSIS — T8141XA Infection following a procedure, superficial incisional surgical site, initial encounter: Secondary | ICD-10-CM | POA: Diagnosis not present

## 2018-09-17 DIAGNOSIS — I129 Hypertensive chronic kidney disease with stage 1 through stage 4 chronic kidney disease, or unspecified chronic kidney disease: Secondary | ICD-10-CM | POA: Diagnosis not present

## 2018-09-17 DIAGNOSIS — E785 Hyperlipidemia, unspecified: Secondary | ICD-10-CM | POA: Diagnosis not present

## 2018-09-17 DIAGNOSIS — Z9181 History of falling: Secondary | ICD-10-CM | POA: Diagnosis not present

## 2018-09-17 DIAGNOSIS — K5730 Diverticulosis of large intestine without perforation or abscess without bleeding: Secondary | ICD-10-CM | POA: Diagnosis not present

## 2018-09-17 DIAGNOSIS — Z792 Long term (current) use of antibiotics: Secondary | ICD-10-CM | POA: Diagnosis not present

## 2018-09-17 DIAGNOSIS — D631 Anemia in chronic kidney disease: Secondary | ICD-10-CM | POA: Diagnosis not present

## 2018-09-17 DIAGNOSIS — M1990 Unspecified osteoarthritis, unspecified site: Secondary | ICD-10-CM | POA: Diagnosis not present

## 2018-09-17 DIAGNOSIS — M48061 Spinal stenosis, lumbar region without neurogenic claudication: Secondary | ICD-10-CM | POA: Diagnosis not present

## 2018-09-20 DIAGNOSIS — I129 Hypertensive chronic kidney disease with stage 1 through stage 4 chronic kidney disease, or unspecified chronic kidney disease: Secondary | ICD-10-CM | POA: Diagnosis not present

## 2018-09-20 DIAGNOSIS — E785 Hyperlipidemia, unspecified: Secondary | ICD-10-CM | POA: Diagnosis not present

## 2018-09-20 DIAGNOSIS — E1122 Type 2 diabetes mellitus with diabetic chronic kidney disease: Secondary | ICD-10-CM | POA: Diagnosis not present

## 2018-09-20 DIAGNOSIS — L02212 Cutaneous abscess of back [any part, except buttock]: Secondary | ICD-10-CM | POA: Diagnosis not present

## 2018-09-20 DIAGNOSIS — N183 Chronic kidney disease, stage 3 (moderate): Secondary | ICD-10-CM | POA: Diagnosis not present

## 2018-09-20 DIAGNOSIS — T8141XA Infection following a procedure, superficial incisional surgical site, initial encounter: Secondary | ICD-10-CM | POA: Diagnosis not present

## 2018-09-20 DIAGNOSIS — M1990 Unspecified osteoarthritis, unspecified site: Secondary | ICD-10-CM | POA: Diagnosis not present

## 2018-09-20 DIAGNOSIS — Z792 Long term (current) use of antibiotics: Secondary | ICD-10-CM | POA: Diagnosis not present

## 2018-09-20 DIAGNOSIS — D631 Anemia in chronic kidney disease: Secondary | ICD-10-CM | POA: Diagnosis not present

## 2018-09-20 DIAGNOSIS — Z981 Arthrodesis status: Secondary | ICD-10-CM | POA: Diagnosis not present

## 2018-09-20 DIAGNOSIS — Z9181 History of falling: Secondary | ICD-10-CM | POA: Diagnosis not present

## 2018-09-20 DIAGNOSIS — M48061 Spinal stenosis, lumbar region without neurogenic claudication: Secondary | ICD-10-CM | POA: Diagnosis not present

## 2018-09-20 DIAGNOSIS — K5730 Diverticulosis of large intestine without perforation or abscess without bleeding: Secondary | ICD-10-CM | POA: Diagnosis not present

## 2018-09-20 DIAGNOSIS — I2510 Atherosclerotic heart disease of native coronary artery without angina pectoris: Secondary | ICD-10-CM | POA: Diagnosis not present

## 2018-09-20 DIAGNOSIS — D649 Anemia, unspecified: Secondary | ICD-10-CM | POA: Diagnosis not present

## 2018-09-20 DIAGNOSIS — N184 Chronic kidney disease, stage 4 (severe): Secondary | ICD-10-CM | POA: Diagnosis not present

## 2018-09-20 DIAGNOSIS — R809 Proteinuria, unspecified: Secondary | ICD-10-CM | POA: Diagnosis not present

## 2018-09-20 DIAGNOSIS — E039 Hypothyroidism, unspecified: Secondary | ICD-10-CM | POA: Diagnosis not present

## 2018-09-22 DIAGNOSIS — E785 Hyperlipidemia, unspecified: Secondary | ICD-10-CM | POA: Diagnosis not present

## 2018-09-22 DIAGNOSIS — Z9181 History of falling: Secondary | ICD-10-CM | POA: Diagnosis not present

## 2018-09-22 DIAGNOSIS — E039 Hypothyroidism, unspecified: Secondary | ICD-10-CM | POA: Diagnosis not present

## 2018-09-22 DIAGNOSIS — I2510 Atherosclerotic heart disease of native coronary artery without angina pectoris: Secondary | ICD-10-CM | POA: Diagnosis not present

## 2018-09-22 DIAGNOSIS — E1122 Type 2 diabetes mellitus with diabetic chronic kidney disease: Secondary | ICD-10-CM | POA: Diagnosis not present

## 2018-09-22 DIAGNOSIS — I129 Hypertensive chronic kidney disease with stage 1 through stage 4 chronic kidney disease, or unspecified chronic kidney disease: Secondary | ICD-10-CM | POA: Diagnosis not present

## 2018-09-22 DIAGNOSIS — M1990 Unspecified osteoarthritis, unspecified site: Secondary | ICD-10-CM | POA: Diagnosis not present

## 2018-09-22 DIAGNOSIS — D631 Anemia in chronic kidney disease: Secondary | ICD-10-CM | POA: Diagnosis not present

## 2018-09-22 DIAGNOSIS — T8141XA Infection following a procedure, superficial incisional surgical site, initial encounter: Secondary | ICD-10-CM | POA: Diagnosis not present

## 2018-09-22 DIAGNOSIS — K5730 Diverticulosis of large intestine without perforation or abscess without bleeding: Secondary | ICD-10-CM | POA: Diagnosis not present

## 2018-09-22 DIAGNOSIS — Z792 Long term (current) use of antibiotics: Secondary | ICD-10-CM | POA: Diagnosis not present

## 2018-09-22 DIAGNOSIS — M48061 Spinal stenosis, lumbar region without neurogenic claudication: Secondary | ICD-10-CM | POA: Diagnosis not present

## 2018-09-22 DIAGNOSIS — Z981 Arthrodesis status: Secondary | ICD-10-CM | POA: Diagnosis not present

## 2018-09-22 DIAGNOSIS — L02212 Cutaneous abscess of back [any part, except buttock]: Secondary | ICD-10-CM | POA: Diagnosis not present

## 2018-09-22 DIAGNOSIS — N183 Chronic kidney disease, stage 3 (moderate): Secondary | ICD-10-CM | POA: Diagnosis not present

## 2018-09-23 ENCOUNTER — Other Ambulatory Visit (HOSPITAL_COMMUNITY): Payer: Self-pay | Admitting: Internal Medicine

## 2018-09-23 ENCOUNTER — Other Ambulatory Visit: Payer: Self-pay

## 2018-09-23 DIAGNOSIS — I129 Hypertensive chronic kidney disease with stage 1 through stage 4 chronic kidney disease, or unspecified chronic kidney disease: Secondary | ICD-10-CM

## 2018-09-23 DIAGNOSIS — D649 Anemia, unspecified: Secondary | ICD-10-CM

## 2018-09-23 DIAGNOSIS — N184 Chronic kidney disease, stage 4 (severe): Secondary | ICD-10-CM

## 2018-09-23 DIAGNOSIS — R809 Proteinuria, unspecified: Secondary | ICD-10-CM

## 2018-09-24 DIAGNOSIS — D631 Anemia in chronic kidney disease: Secondary | ICD-10-CM | POA: Diagnosis not present

## 2018-09-24 DIAGNOSIS — I2510 Atherosclerotic heart disease of native coronary artery without angina pectoris: Secondary | ICD-10-CM | POA: Diagnosis not present

## 2018-09-24 DIAGNOSIS — E039 Hypothyroidism, unspecified: Secondary | ICD-10-CM | POA: Diagnosis not present

## 2018-09-24 DIAGNOSIS — N183 Chronic kidney disease, stage 3 (moderate): Secondary | ICD-10-CM | POA: Diagnosis not present

## 2018-09-24 DIAGNOSIS — M48061 Spinal stenosis, lumbar region without neurogenic claudication: Secondary | ICD-10-CM | POA: Diagnosis not present

## 2018-09-24 DIAGNOSIS — M1990 Unspecified osteoarthritis, unspecified site: Secondary | ICD-10-CM | POA: Diagnosis not present

## 2018-09-24 DIAGNOSIS — Z981 Arthrodesis status: Secondary | ICD-10-CM | POA: Diagnosis not present

## 2018-09-24 DIAGNOSIS — Z9181 History of falling: Secondary | ICD-10-CM | POA: Diagnosis not present

## 2018-09-24 DIAGNOSIS — E785 Hyperlipidemia, unspecified: Secondary | ICD-10-CM | POA: Diagnosis not present

## 2018-09-24 DIAGNOSIS — T8141XA Infection following a procedure, superficial incisional surgical site, initial encounter: Secondary | ICD-10-CM | POA: Diagnosis not present

## 2018-09-24 DIAGNOSIS — L02212 Cutaneous abscess of back [any part, except buttock]: Secondary | ICD-10-CM | POA: Diagnosis not present

## 2018-09-24 DIAGNOSIS — K5730 Diverticulosis of large intestine without perforation or abscess without bleeding: Secondary | ICD-10-CM | POA: Diagnosis not present

## 2018-09-24 DIAGNOSIS — E1122 Type 2 diabetes mellitus with diabetic chronic kidney disease: Secondary | ICD-10-CM | POA: Diagnosis not present

## 2018-09-24 DIAGNOSIS — Z792 Long term (current) use of antibiotics: Secondary | ICD-10-CM | POA: Diagnosis not present

## 2018-09-24 DIAGNOSIS — I129 Hypertensive chronic kidney disease with stage 1 through stage 4 chronic kidney disease, or unspecified chronic kidney disease: Secondary | ICD-10-CM | POA: Diagnosis not present

## 2018-09-27 DIAGNOSIS — Z9181 History of falling: Secondary | ICD-10-CM | POA: Diagnosis not present

## 2018-09-27 DIAGNOSIS — K5730 Diverticulosis of large intestine without perforation or abscess without bleeding: Secondary | ICD-10-CM | POA: Diagnosis not present

## 2018-09-27 DIAGNOSIS — D631 Anemia in chronic kidney disease: Secondary | ICD-10-CM | POA: Diagnosis not present

## 2018-09-27 DIAGNOSIS — Z792 Long term (current) use of antibiotics: Secondary | ICD-10-CM | POA: Diagnosis not present

## 2018-09-27 DIAGNOSIS — T8141XA Infection following a procedure, superficial incisional surgical site, initial encounter: Secondary | ICD-10-CM | POA: Diagnosis not present

## 2018-09-27 DIAGNOSIS — E785 Hyperlipidemia, unspecified: Secondary | ICD-10-CM | POA: Diagnosis not present

## 2018-09-27 DIAGNOSIS — M48061 Spinal stenosis, lumbar region without neurogenic claudication: Secondary | ICD-10-CM | POA: Diagnosis not present

## 2018-09-27 DIAGNOSIS — N183 Chronic kidney disease, stage 3 (moderate): Secondary | ICD-10-CM | POA: Diagnosis not present

## 2018-09-27 DIAGNOSIS — M1990 Unspecified osteoarthritis, unspecified site: Secondary | ICD-10-CM | POA: Diagnosis not present

## 2018-09-27 DIAGNOSIS — E039 Hypothyroidism, unspecified: Secondary | ICD-10-CM | POA: Diagnosis not present

## 2018-09-27 DIAGNOSIS — L02212 Cutaneous abscess of back [any part, except buttock]: Secondary | ICD-10-CM | POA: Diagnosis not present

## 2018-09-27 DIAGNOSIS — I129 Hypertensive chronic kidney disease with stage 1 through stage 4 chronic kidney disease, or unspecified chronic kidney disease: Secondary | ICD-10-CM | POA: Diagnosis not present

## 2018-09-27 DIAGNOSIS — I2510 Atherosclerotic heart disease of native coronary artery without angina pectoris: Secondary | ICD-10-CM | POA: Diagnosis not present

## 2018-09-27 DIAGNOSIS — E1122 Type 2 diabetes mellitus with diabetic chronic kidney disease: Secondary | ICD-10-CM | POA: Diagnosis not present

## 2018-09-27 DIAGNOSIS — Z981 Arthrodesis status: Secondary | ICD-10-CM | POA: Diagnosis not present

## 2018-10-01 DIAGNOSIS — M1990 Unspecified osteoarthritis, unspecified site: Secondary | ICD-10-CM | POA: Diagnosis not present

## 2018-10-01 DIAGNOSIS — I129 Hypertensive chronic kidney disease with stage 1 through stage 4 chronic kidney disease, or unspecified chronic kidney disease: Secondary | ICD-10-CM | POA: Diagnosis not present

## 2018-10-01 DIAGNOSIS — E1122 Type 2 diabetes mellitus with diabetic chronic kidney disease: Secondary | ICD-10-CM | POA: Diagnosis not present

## 2018-10-01 DIAGNOSIS — N183 Chronic kidney disease, stage 3 (moderate): Secondary | ICD-10-CM | POA: Diagnosis not present

## 2018-10-01 DIAGNOSIS — K5730 Diverticulosis of large intestine without perforation or abscess without bleeding: Secondary | ICD-10-CM | POA: Diagnosis not present

## 2018-10-01 DIAGNOSIS — E785 Hyperlipidemia, unspecified: Secondary | ICD-10-CM | POA: Diagnosis not present

## 2018-10-01 DIAGNOSIS — Z792 Long term (current) use of antibiotics: Secondary | ICD-10-CM | POA: Diagnosis not present

## 2018-10-01 DIAGNOSIS — D631 Anemia in chronic kidney disease: Secondary | ICD-10-CM | POA: Diagnosis not present

## 2018-10-01 DIAGNOSIS — M48061 Spinal stenosis, lumbar region without neurogenic claudication: Secondary | ICD-10-CM | POA: Diagnosis not present

## 2018-10-01 DIAGNOSIS — I2510 Atherosclerotic heart disease of native coronary artery without angina pectoris: Secondary | ICD-10-CM | POA: Diagnosis not present

## 2018-10-01 DIAGNOSIS — E039 Hypothyroidism, unspecified: Secondary | ICD-10-CM | POA: Diagnosis not present

## 2018-10-01 DIAGNOSIS — Z981 Arthrodesis status: Secondary | ICD-10-CM | POA: Diagnosis not present

## 2018-10-01 DIAGNOSIS — T8141XA Infection following a procedure, superficial incisional surgical site, initial encounter: Secondary | ICD-10-CM | POA: Diagnosis not present

## 2018-10-01 DIAGNOSIS — L02212 Cutaneous abscess of back [any part, except buttock]: Secondary | ICD-10-CM | POA: Diagnosis not present

## 2018-10-01 DIAGNOSIS — Z9181 History of falling: Secondary | ICD-10-CM | POA: Diagnosis not present

## 2018-10-06 ENCOUNTER — Other Ambulatory Visit: Payer: Self-pay | Admitting: Radiology

## 2018-10-06 ENCOUNTER — Other Ambulatory Visit: Payer: Self-pay | Admitting: Physician Assistant

## 2018-10-06 DIAGNOSIS — Z981 Arthrodesis status: Secondary | ICD-10-CM | POA: Diagnosis not present

## 2018-10-06 DIAGNOSIS — K5730 Diverticulosis of large intestine without perforation or abscess without bleeding: Secondary | ICD-10-CM | POA: Diagnosis not present

## 2018-10-06 DIAGNOSIS — N183 Chronic kidney disease, stage 3 (moderate): Secondary | ICD-10-CM | POA: Diagnosis not present

## 2018-10-06 DIAGNOSIS — E785 Hyperlipidemia, unspecified: Secondary | ICD-10-CM | POA: Diagnosis not present

## 2018-10-06 DIAGNOSIS — Z9181 History of falling: Secondary | ICD-10-CM | POA: Diagnosis not present

## 2018-10-06 DIAGNOSIS — M1990 Unspecified osteoarthritis, unspecified site: Secondary | ICD-10-CM | POA: Diagnosis not present

## 2018-10-06 DIAGNOSIS — I129 Hypertensive chronic kidney disease with stage 1 through stage 4 chronic kidney disease, or unspecified chronic kidney disease: Secondary | ICD-10-CM | POA: Diagnosis not present

## 2018-10-06 DIAGNOSIS — L02212 Cutaneous abscess of back [any part, except buttock]: Secondary | ICD-10-CM | POA: Diagnosis not present

## 2018-10-06 DIAGNOSIS — I2510 Atherosclerotic heart disease of native coronary artery without angina pectoris: Secondary | ICD-10-CM | POA: Diagnosis not present

## 2018-10-06 DIAGNOSIS — E039 Hypothyroidism, unspecified: Secondary | ICD-10-CM | POA: Diagnosis not present

## 2018-10-06 DIAGNOSIS — T8141XA Infection following a procedure, superficial incisional surgical site, initial encounter: Secondary | ICD-10-CM | POA: Diagnosis not present

## 2018-10-06 DIAGNOSIS — D631 Anemia in chronic kidney disease: Secondary | ICD-10-CM | POA: Diagnosis not present

## 2018-10-06 DIAGNOSIS — E1122 Type 2 diabetes mellitus with diabetic chronic kidney disease: Secondary | ICD-10-CM | POA: Diagnosis not present

## 2018-10-06 DIAGNOSIS — Z792 Long term (current) use of antibiotics: Secondary | ICD-10-CM | POA: Diagnosis not present

## 2018-10-06 DIAGNOSIS — M48061 Spinal stenosis, lumbar region without neurogenic claudication: Secondary | ICD-10-CM | POA: Diagnosis not present

## 2018-10-07 ENCOUNTER — Ambulatory Visit (HOSPITAL_COMMUNITY)
Admission: RE | Admit: 2018-10-07 | Discharge: 2018-10-07 | Disposition: A | Discharge status: Home / Self Care | Payer: Medicare Other | Source: Ambulatory Visit | Attending: Internal Medicine | Admitting: Internal Medicine

## 2018-10-07 ENCOUNTER — Encounter (HOSPITAL_COMMUNITY): Payer: Self-pay

## 2018-10-07 ENCOUNTER — Other Ambulatory Visit: Payer: Self-pay

## 2018-10-07 ENCOUNTER — Ambulatory Visit: Payer: Self-pay | Admitting: General Surgery

## 2018-10-07 DIAGNOSIS — N183 Chronic kidney disease, stage 3 (moderate): Secondary | ICD-10-CM | POA: Diagnosis not present

## 2018-10-07 DIAGNOSIS — D649 Anemia, unspecified: Secondary | ICD-10-CM

## 2018-10-07 DIAGNOSIS — R809 Proteinuria, unspecified: Secondary | ICD-10-CM

## 2018-10-07 DIAGNOSIS — I129 Hypertensive chronic kidney disease with stage 1 through stage 4 chronic kidney disease, or unspecified chronic kidney disease: Secondary | ICD-10-CM

## 2018-10-07 DIAGNOSIS — N184 Chronic kidney disease, stage 4 (severe): Secondary | ICD-10-CM

## 2018-10-07 LAB — CBC
HCT: 31.3 % — ABNORMAL LOW (ref 36.0–46.0)
Hemoglobin: 9.7 g/dL — ABNORMAL LOW (ref 12.0–15.0)
MCH: 28.4 pg (ref 26.0–34.0)
MCHC: 31 g/dL (ref 30.0–36.0)
MCV: 91.5 fL (ref 80.0–100.0)
Platelets: 267 10*3/uL (ref 150–400)
RBC: 3.42 MIL/uL — ABNORMAL LOW (ref 3.87–5.11)
RDW: 14.1 % (ref 11.5–15.5)
WBC: 6.2 10*3/uL (ref 4.0–10.5)
nRBC: 0 % (ref 0.0–0.2)

## 2018-10-07 LAB — PROTIME-INR
INR: 1.2 (ref 0.8–1.2)
Prothrombin Time: 15 seconds (ref 11.4–15.2)

## 2018-10-07 LAB — GLUCOSE, CAPILLARY: Glucose-Capillary: 85 mg/dL (ref 70–99)

## 2018-10-07 MED ORDER — SODIUM CHLORIDE 0.9 % IV SOLN: INTRAVENOUS | Status: DC

## 2018-10-07 NOTE — H&P (Signed)
Chief Complaint: Patient was seen in consultation today for random renal biopsy at the request of Taylor Tyler  Referring Physician(s): Taylor Tyler  Supervising Physician: Taylor Tyler  Patient Status: [facility] - Out-pt  History of Present Illness: Taylor Tyler is Tyler 78 y.o. female   Pt with Hx HTN; CKD and proteinuria Referred to Dr Taylor Tyler Scheduled for random renal biopsy  BP   Past Medical History:  Diagnosis Date  . Acute blood loss anemia 03/14/2017  . Acute renal failure superimposed on stage 3 chronic kidney disease ([place]) 03/14/2017  . AKI (acute kidney injury) ([place]) 02/28/2017  . Altered mental status 03/29/2017  . Anemia   . Aortic atherosclerosis ([place])   . Arthritis    "joints might ache at times; not that bad" (06/10/2018)  . Bilateral lower extremity edema 03/04/2017  . Bradycardia   . Chronic kidney disease    ?? renal insufficiency,   . CKD (chronic kidney disease) stage 3, GFR 30-59 ml/min (H[facility]) 01/15/2017   ?? renal insufficiency, which she thinks is coming from "all these medications"  . Diabetes mellitus without complication ([place])    diagnosed 4-5 yrs ago, 06/21/18- "that was years ago"  . Disease of pancreas   . Diverticulitis    s/p perforation and partial colectomy 01/27/14 with 3 benign lymph nodes   . Diverticulosis 01/15/2017  . DJD (degenerative joint disease)   . DM (diabetes mellitus), type 2 with renal complications ([place]) 4/74/2595  . Fatty liver   . Hypertension   . Hypothyroidism    "had radiation" (06/10/2018)  . Kidney stone   . Lethargy 02/28/2017  . Obesity, Class III, BMI 40-49.9 (morbid obesity) ([place]) 02/27/2017  . Pleural lipoma   . Postoperative wound infection 04/02/2017  . Spinal stenosis of lumbar region 01/15/2017  . Status post lumbar surgery   . Wound healing, delayed    back    Past Surgical History:  Procedure Laterality Date  . ABDOMINAL EXPOSURE N/Tyler 02/24/2017   Procedure: ABDOMINAL EXPOSURE;  Surgeon: Angelia Mould, MD;  Location: [place];  Service: Vascular;  Laterality: N/Tyler;  . ABDOMINAL HYSTERECTOMY  1987   no h/o abnormal paps   . ANTERIOR LAT LUMBAR FUSION N/Tyler 02/24/2017   Procedure: Lumbar three- five Anterior lateral lumbar interbody fusion;  Surgeon: Ditty, Kevan Ny, MD;  Location: [place];  Service: Neurosurgery;  Laterality: N/Tyler;  L3-5 Anterior lateral lumbar interbody fusion with removal of coflex at L3-4, L4-5  . ANTERIOR LUMBAR FUSION N/Tyler 02/24/2017   Procedure: Stage 1: Lumbar five-Sacral one Anterior lumbar interbody fusion;  Surgeon: Ditty, Kevan Ny, MD;  Location: [place];  Service: Neurosurgery;  Laterality: N/Tyler;  Stage 1: L5-S1 Anterior lumbar interbody fusion  . APPENDECTOMY    . APPLICATION OF ROBOTIC ASSISTANCE FOR SPINAL PROCEDURE N/Tyler 02/26/2017   Procedure: APPLICATION OF ROBOTIC ASSISTANCE FOR SPINAL PROCEDURE;  Surgeon: Ditty, Kevan Ny, MD;  Location: [place];  Service: Neurosurgery;  Laterality: N/Tyler;  . APPLICATION OF WOUND VAC N/Tyler 03/30/2017   Procedure: APPLICATION OF WOUND VAC;  Surgeon: Ditty, Kevan Ny, MD;  Location: [place];  Service: Neurosurgery;  Laterality: N/Tyler;  . BACK SURGERY  2013  . CATARACT EXTRACTION W/ INTRAOCULAR LENS IMPLANT Right   . CHOLECYSTECTOMY OPEN  1982  . COLON SURGERY  01/26/2014   desc.sigmoid colectomy and ventral hernia repair and splenic flexure mobilization  . COLON SURGERY    . DILATION AND CURETTAGE OF UTERUS    . HERNIA REPAIR    .  LUMBAR LAMINECTOMY WITH SPINOUS PROCESS PLATE 2 LEVEL N/Tyler 9/51/8841   Procedure: LUMBAR LAMINECTOMY/DECOMPRESSION MICRODISCECTOMY CoFlex;  Surgeon: Faythe Ghee, MD;  Location: [facility];  Service: Neurosurgery;  Laterality: N/Tyler;  Lumbar three-four,Lumbar Four-Five Laminectomy with Coflex  . LUMBAR WOUND DEBRIDEMENT N/Tyler 03/30/2017   Procedure: Lumbar wound exploration/debridement, placement of wound vac;  Surgeon: Ditty, Kevan Ny, MD;  Location: [place];  Service: Neurosurgery;   Laterality: N/Tyler;  Lumbar wound exploration/debridement, placement of wound vac  . LUMBAR WOUND DEBRIDEMENT N/Tyler 06/06/2018   Procedure: LUMBAR WOUND DEBRIDEMENT/EXPLORATION;  Surgeon: Consuella Lose, MD;  Location: [place];  Service: Neurosurgery;  Laterality: N/Tyler;  . LUMBAR WOUND DEBRIDEMENT N/Tyler 06/22/2018   Procedure: SIMPLE INCISION AND DRAINAGE OF WOUND, APPLICATION OF WOUND VAC;  Surgeon: Consuella Lose, MD;  Location: [place];  Service: Neurosurgery;  Laterality: N/Tyler;  . PARTIAL COLECTOMY     01/27/14 diverticulitis and 3 benign lymph nodes [facility] Dr. Pat Patrick   . REDUCTION MAMMAPLASTY Bilateral 1992  . TUBAL LIGATION    . VENTRAL HERNIA REPAIR  2014    Allergies: Clams [shellfish allergy]; Hydralazine; Norvasc [amlodipine besylate]; and Milk-related compounds  Medications: Prior to Admission medications   Medication Sig Start Date End Date Taking? Authorizing Provider  acetaminophen (TYLENOL) 500 MG tablet Take 500 mg by mouth 2 (two) times daily as needed for mild pain.    Yes [provider]  atorvastatin (LIPITOR) 40 MG tablet Take 1 tablet (40 mg total) by mouth daily at 6 PM. 09/03/18  Yes McLean-Scocuzza, Nino Glow, MD  ferrous sulfate 325 (65 FE) MG tablet Take 325 mg by mouth daily with breakfast.   Yes [provider]  furosemide (LASIX) 40 MG tablet Take 1 tablet (40 mg total) by mouth daily. In am 09/03/18  Yes McLean-Scocuzza, Nino Glow, MD  gabapentin (NEURONTIN) 300 MG capsule Take 300 mg by mouth daily as needed (nerve pain).   Yes [provider]  labetalol (NORMODYNE) 100 MG tablet Take 100 mg by mouth 2 (two) times daily.   Yes [provider]  levothyroxine (SYNTHROID, LEVOTHROID) 100 MCG tablet Take 100 mcg by mouth daily before breakfast.   Yes [provider]  amLODipine (NORVASC) 10 MG tablet Take 1 tablet (10 mg total) by mouth daily. Patient not taking: Reported on 10/01/2018 06/12/18   Hosie Poisson, MD  carvedilol (COREG) 25  MG tablet Take 1 tablet (25 mg total) by mouth 2 (two) times daily with Tyler meal. Patient not taking: Reported on 10/01/2018 06/12/18   Hosie Poisson, MD  hydrALAZINE (APRESOLINE) 50 MG tablet Take 1 tablet (50 mg total) by mouth every 8 (eight) hours. Patient not taking: Reported on 10/01/2018 06/11/18   Hosie Poisson, MD  isosorbide mononitrate (IMDUR) 30 MG 24 hr tablet Take 1 tablet (30 mg total) by mouth daily. Patient not taking: Reported on 10/01/2018 06/06/18   Cassandria Santee, MD     Family History  Problem Relation Age of Onset  . Diabetes Father   . Heart attack Father   . Hypertension Father   . Heart disease Father   . Kidney disease Father   . Diabetes Mother   . Hypertension Mother   . Heart disease Mother   . Kidney disease Mother   . Lupus Sister   . Heart disease Brother   . Diabetes Brother   . Heart disease Brother   . Diabetes Brother   . Lupus Sister     Social History   Socioeconomic  History  . Marital status: Widowed    Spouse name: Not on file  . Number of children: Not on file  . Years of education: Not on file  . Highest education level: Not on file  Occupational History  . Occupation: retired Economist work  Scientific laboratory technician  . Financial resource strain: Not hard at all  . Food insecurity:    Worry: Never true    Inability: Never true  . Transportation needs:    Medical: No    Non-medical: No  Tobacco Use  . Smoking status: Never Smoker  . Smokeless tobacco: Never Used  Substance and Sexual Activity  . Alcohol use: Not Currently    Frequency: Never    Comment: "nothing since age 62" (06/10/2018)  . Drug use: Never  . Sexual activity: Not Currently  Lifestyle  . Physical activity:    Days per week: 0 days    Minutes per session: Not on file  . Stress: Not on file  Relationships  . Social connections:    Talks on phone: Not on file    Gets together: Not on file    Attends religious service: Not on file    Active member of club or organization: Not  on file    Attends meetings of clubs or organizations: Not on file    Relationship status: Not on file  Other Topics Concern  . Not on file  Social History Narrative   Admitted to [facility] 03/03/17   Widowed since 2012    2 sons [name] comes to appts)    Never smoked   Alcohol none   Full Code   12th grade education retired    Occupational hygienist     Review of Systems: Tyler 12 point ROS discussed and pertinent positives are indicated in the HPI above.  All other systems are negative.  Review of Systems  Constitutional: Negative for fatigue.  Respiratory: Negative for cough and shortness of breath.   Neurological: Negative for weakness.  Psychiatric/Behavioral: Negative for behavioral problems and confusion.    Vital Signs: Temp (P) 98 F (36.7 C) (Oral)   Resp (P) 16   Ht (P) 5\' 2"  (1.575 m)   Wt (P) 190 lb (86.2 kg)   SpO2 (P) 98%   BMI (P) 34.75 kg/m   Physical Exam Vitals signs reviewed.  Cardiovascular:     Rate and Rhythm: Normal rate and regular rhythm.     Heart sounds: Normal heart sounds.  Pulmonary:     Effort: Pulmonary effort is normal.     Breath sounds: Normal breath sounds.  Abdominal:     General: Bowel sounds are normal.     Palpations: Abdomen is soft.  Musculoskeletal: Normal range of motion.  Skin:    General: Skin is warm and dry.  Neurological:     Mental Status: She is alert and oriented to person, place, and time.  Psychiatric:        Behavior: Behavior normal.        Thought Content: Thought content normal.        Judgment: Judgment normal.     Imaging: No results found.  Labs:  CBC: Recent Labs    06/08/18 0457 06/09/18 0506 06/11/18 0357 06/22/18 0911  WBC 18.4* 19.0* 13.3* 7.2  HGB 8.4* 9.7* 8.7* 9.4*  HCT 27.6* 31.4* 29.1* 32.2*  PLT 362 392 397 539*    COAGS: Recent Labs    06/04/18 0636 06/05/18 1455  INR  1.38 1.45    BMP: Recent Labs    06/08/18 0457 06/09/18 0506 06/11/18 0357  06/22/18 0911  NA 134* 138 136 140  K 4.3 5.0 4.4 4.0  CL 105 104 105 108  CO2 22 26 25 25   GLUCOSE 122* 90 113* 106*  BUN 58* 54* 48* 25*  CALCIUM 8.3* 8.8* 8.7* 9.1  CREATININE 2.21* 1.99* 2.04* 1.66*  GFRNONAA 21* 23* 23* 29*  GFRAA 24* 27* 26* 34*    LIVER FUNCTION TESTS: Recent Labs    06/06/18 0314 06/07/18 0432 06/08/18 0457 06/09/18 0506  BILITOT 0.6 0.7 0.6 0.6  AST 13* 13* 16 18  ALT 11 11 10 13   ALKPHOS 60 58 63 61  PROT 6.0* 6.6 5.8* 6.7  ALBUMIN 1.6* 1.8* 1.8* 2.0*    TUMOR MARKERS: No results for input(s): AFPTM, CEA, CA199, CHROMGRNA in the last 8760 hours.  Assessment and Plan:  HTN- not well controlled Proteinuria Scheduled for random renal biopsy per nephrologist BP is elevated Will discuss with Dr Barbie Banner  Risks and benefits of  Random renal biopsy was discussed with the patient and/or patient's family including, but not limited to bleeding, infection, damage to adjacent structures or low yield requiring additional tests.  All of the questions were answered and there is agreement to proceed. Consent signed and in chart.   Thank you for this interesting consult.  I greatly enjoyed meeting Taylor Tyler and look forward to participating in their care.  Tyler copy of this report was sent to the requesting provider on this date.  Electronically Signed: Lavonia Drafts, PA-C 10/07/2018, 8:03 AM   I spent Tyler total of  30 Minutes   in face to face in clinical consultation, greater than 50% of which was counseling/coordinating care for random renal biopsy

## 2018-10-07 NOTE — Progress Notes (Addendum)
[name], female   DOB: 01-03-1941, 78 y.o.   MRN: [id number]   Pts BP 219/90 and repeat at 243/92  Too high to proceed with renal biopsy  Pt only taking Labetolol for BP in am "Was taken off other meds few months ago"  See 1 mo ago by PCP "didn't mention my BP was abnormal"  Will NOT perform bx today Will need BP under better control I will call PCP Dr Aundra Dubin and Nephrology office Dr Johnney Ou  Pt is aware and agreeable   Addendum:  Spoke to Dr McLean-Scocuzza--- she is not treating pts BP any longer--- was referred to Dr Johnney Ou  Recent note from Dr McLean-Scocuzza: 09/07/18: HTN sl elevated called and spoke with renal Dr. Johnney Ou pt is on coreg 25 mg bid, norvasc 10 mg qd, clonidine 0.1 bid, lasix 40 mg bid, hydralazine 50 bid, Imdur 30 qd   Spoke with Dr Johnney Ou RN She will report to Dr Johnney Ou Office will call pt for evaluation and management of BP Must be wnl tp proceed with random renal biopsy RN aware and will call pt

## 2018-10-08 DIAGNOSIS — E785 Hyperlipidemia, unspecified: Secondary | ICD-10-CM | POA: Diagnosis not present

## 2018-10-08 DIAGNOSIS — Z9181 History of falling: Secondary | ICD-10-CM | POA: Diagnosis not present

## 2018-10-08 DIAGNOSIS — M48061 Spinal stenosis, lumbar region without neurogenic claudication: Secondary | ICD-10-CM | POA: Diagnosis not present

## 2018-10-08 DIAGNOSIS — I2510 Atherosclerotic heart disease of native coronary artery without angina pectoris: Secondary | ICD-10-CM | POA: Diagnosis not present

## 2018-10-08 DIAGNOSIS — Z981 Arthrodesis status: Secondary | ICD-10-CM | POA: Diagnosis not present

## 2018-10-08 DIAGNOSIS — T8141XA Infection following a procedure, superficial incisional surgical site, initial encounter: Secondary | ICD-10-CM | POA: Diagnosis not present

## 2018-10-08 DIAGNOSIS — I129 Hypertensive chronic kidney disease with stage 1 through stage 4 chronic kidney disease, or unspecified chronic kidney disease: Secondary | ICD-10-CM | POA: Diagnosis not present

## 2018-10-08 DIAGNOSIS — N183 Chronic kidney disease, stage 3 (moderate): Secondary | ICD-10-CM | POA: Diagnosis not present

## 2018-10-08 DIAGNOSIS — Z792 Long term (current) use of antibiotics: Secondary | ICD-10-CM | POA: Diagnosis not present

## 2018-10-08 DIAGNOSIS — M1990 Unspecified osteoarthritis, unspecified site: Secondary | ICD-10-CM | POA: Diagnosis not present

## 2018-10-08 DIAGNOSIS — D631 Anemia in chronic kidney disease: Secondary | ICD-10-CM | POA: Diagnosis not present

## 2018-10-08 DIAGNOSIS — E039 Hypothyroidism, unspecified: Secondary | ICD-10-CM | POA: Diagnosis not present

## 2018-10-08 DIAGNOSIS — L02212 Cutaneous abscess of back [any part, except buttock]: Secondary | ICD-10-CM | POA: Diagnosis not present

## 2018-10-08 DIAGNOSIS — E1122 Type 2 diabetes mellitus with diabetic chronic kidney disease: Secondary | ICD-10-CM | POA: Diagnosis not present

## 2018-10-08 DIAGNOSIS — K5730 Diverticulosis of large intestine without perforation or abscess without bleeding: Secondary | ICD-10-CM | POA: Diagnosis not present

## 2018-10-11 DIAGNOSIS — Z9181 History of falling: Secondary | ICD-10-CM | POA: Diagnosis not present

## 2018-10-11 DIAGNOSIS — Z981 Arthrodesis status: Secondary | ICD-10-CM | POA: Diagnosis not present

## 2018-10-11 DIAGNOSIS — E039 Hypothyroidism, unspecified: Secondary | ICD-10-CM | POA: Diagnosis not present

## 2018-10-11 DIAGNOSIS — D631 Anemia in chronic kidney disease: Secondary | ICD-10-CM | POA: Diagnosis not present

## 2018-10-11 DIAGNOSIS — M1990 Unspecified osteoarthritis, unspecified site: Secondary | ICD-10-CM | POA: Diagnosis not present

## 2018-10-11 DIAGNOSIS — E785 Hyperlipidemia, unspecified: Secondary | ICD-10-CM | POA: Diagnosis not present

## 2018-10-11 DIAGNOSIS — I2510 Atherosclerotic heart disease of native coronary artery without angina pectoris: Secondary | ICD-10-CM | POA: Diagnosis not present

## 2018-10-11 DIAGNOSIS — Z792 Long term (current) use of antibiotics: Secondary | ICD-10-CM | POA: Diagnosis not present

## 2018-10-11 DIAGNOSIS — T8141XA Infection following a procedure, superficial incisional surgical site, initial encounter: Secondary | ICD-10-CM | POA: Diagnosis not present

## 2018-10-11 DIAGNOSIS — N183 Chronic kidney disease, stage 3 (moderate): Secondary | ICD-10-CM | POA: Diagnosis not present

## 2018-10-11 DIAGNOSIS — K5730 Diverticulosis of large intestine without perforation or abscess without bleeding: Secondary | ICD-10-CM | POA: Diagnosis not present

## 2018-10-11 DIAGNOSIS — E1122 Type 2 diabetes mellitus with diabetic chronic kidney disease: Secondary | ICD-10-CM | POA: Diagnosis not present

## 2018-10-11 DIAGNOSIS — I129 Hypertensive chronic kidney disease with stage 1 through stage 4 chronic kidney disease, or unspecified chronic kidney disease: Secondary | ICD-10-CM | POA: Diagnosis not present

## 2018-10-11 DIAGNOSIS — L02212 Cutaneous abscess of back [any part, except buttock]: Secondary | ICD-10-CM | POA: Diagnosis not present

## 2018-10-11 DIAGNOSIS — M48061 Spinal stenosis, lumbar region without neurogenic claudication: Secondary | ICD-10-CM | POA: Diagnosis not present

## 2018-10-13 DIAGNOSIS — N183 Chronic kidney disease, stage 3 (moderate): Secondary | ICD-10-CM | POA: Diagnosis not present

## 2018-10-13 DIAGNOSIS — E785 Hyperlipidemia, unspecified: Secondary | ICD-10-CM | POA: Diagnosis not present

## 2018-10-13 DIAGNOSIS — Z792 Long term (current) use of antibiotics: Secondary | ICD-10-CM | POA: Diagnosis not present

## 2018-10-13 DIAGNOSIS — L02212 Cutaneous abscess of back [any part, except buttock]: Secondary | ICD-10-CM | POA: Diagnosis not present

## 2018-10-13 DIAGNOSIS — E039 Hypothyroidism, unspecified: Secondary | ICD-10-CM | POA: Diagnosis not present

## 2018-10-13 DIAGNOSIS — E1122 Type 2 diabetes mellitus with diabetic chronic kidney disease: Secondary | ICD-10-CM | POA: Diagnosis not present

## 2018-10-13 DIAGNOSIS — I2510 Atherosclerotic heart disease of native coronary artery without angina pectoris: Secondary | ICD-10-CM | POA: Diagnosis not present

## 2018-10-13 DIAGNOSIS — Z9181 History of falling: Secondary | ICD-10-CM | POA: Diagnosis not present

## 2018-10-13 DIAGNOSIS — T8141XA Infection following a procedure, superficial incisional surgical site, initial encounter: Secondary | ICD-10-CM | POA: Diagnosis not present

## 2018-10-13 DIAGNOSIS — K5730 Diverticulosis of large intestine without perforation or abscess without bleeding: Secondary | ICD-10-CM | POA: Diagnosis not present

## 2018-10-13 DIAGNOSIS — M48061 Spinal stenosis, lumbar region without neurogenic claudication: Secondary | ICD-10-CM | POA: Diagnosis not present

## 2018-10-13 DIAGNOSIS — I129 Hypertensive chronic kidney disease with stage 1 through stage 4 chronic kidney disease, or unspecified chronic kidney disease: Secondary | ICD-10-CM | POA: Diagnosis not present

## 2018-10-13 DIAGNOSIS — Z981 Arthrodesis status: Secondary | ICD-10-CM | POA: Diagnosis not present

## 2018-10-13 DIAGNOSIS — M1990 Unspecified osteoarthritis, unspecified site: Secondary | ICD-10-CM | POA: Diagnosis not present

## 2018-10-13 DIAGNOSIS — D631 Anemia in chronic kidney disease: Secondary | ICD-10-CM | POA: Diagnosis not present

## 2018-10-15 DIAGNOSIS — E1122 Type 2 diabetes mellitus with diabetic chronic kidney disease: Secondary | ICD-10-CM | POA: Diagnosis not present

## 2018-10-15 DIAGNOSIS — Z981 Arthrodesis status: Secondary | ICD-10-CM | POA: Diagnosis not present

## 2018-10-15 DIAGNOSIS — L02212 Cutaneous abscess of back [any part, except buttock]: Secondary | ICD-10-CM | POA: Diagnosis not present

## 2018-10-15 DIAGNOSIS — I129 Hypertensive chronic kidney disease with stage 1 through stage 4 chronic kidney disease, or unspecified chronic kidney disease: Secondary | ICD-10-CM | POA: Diagnosis not present

## 2018-10-15 DIAGNOSIS — I2510 Atherosclerotic heart disease of native coronary artery without angina pectoris: Secondary | ICD-10-CM | POA: Diagnosis not present

## 2018-10-15 DIAGNOSIS — Z9181 History of falling: Secondary | ICD-10-CM | POA: Diagnosis not present

## 2018-10-15 DIAGNOSIS — E039 Hypothyroidism, unspecified: Secondary | ICD-10-CM | POA: Diagnosis not present

## 2018-10-15 DIAGNOSIS — M1990 Unspecified osteoarthritis, unspecified site: Secondary | ICD-10-CM | POA: Diagnosis not present

## 2018-10-15 DIAGNOSIS — E785 Hyperlipidemia, unspecified: Secondary | ICD-10-CM | POA: Diagnosis not present

## 2018-10-15 DIAGNOSIS — D631 Anemia in chronic kidney disease: Secondary | ICD-10-CM | POA: Diagnosis not present

## 2018-10-15 DIAGNOSIS — M48061 Spinal stenosis, lumbar region without neurogenic claudication: Secondary | ICD-10-CM | POA: Diagnosis not present

## 2018-10-15 DIAGNOSIS — T8141XA Infection following a procedure, superficial incisional surgical site, initial encounter: Secondary | ICD-10-CM | POA: Diagnosis not present

## 2018-10-15 DIAGNOSIS — N183 Chronic kidney disease, stage 3 (moderate): Secondary | ICD-10-CM | POA: Diagnosis not present

## 2018-10-15 DIAGNOSIS — K5730 Diverticulosis of large intestine without perforation or abscess without bleeding: Secondary | ICD-10-CM | POA: Diagnosis not present

## 2018-10-15 DIAGNOSIS — Z792 Long term (current) use of antibiotics: Secondary | ICD-10-CM | POA: Diagnosis not present

## 2018-10-18 DIAGNOSIS — E1122 Type 2 diabetes mellitus with diabetic chronic kidney disease: Secondary | ICD-10-CM | POA: Diagnosis not present

## 2018-10-18 DIAGNOSIS — E039 Hypothyroidism, unspecified: Secondary | ICD-10-CM | POA: Diagnosis not present

## 2018-10-18 DIAGNOSIS — Z792 Long term (current) use of antibiotics: Secondary | ICD-10-CM | POA: Diagnosis not present

## 2018-10-18 DIAGNOSIS — E785 Hyperlipidemia, unspecified: Secondary | ICD-10-CM | POA: Diagnosis not present

## 2018-10-18 DIAGNOSIS — T8141XA Infection following a procedure, superficial incisional surgical site, initial encounter: Secondary | ICD-10-CM | POA: Diagnosis not present

## 2018-10-18 DIAGNOSIS — N183 Chronic kidney disease, stage 3 (moderate): Secondary | ICD-10-CM | POA: Diagnosis not present

## 2018-10-18 DIAGNOSIS — D631 Anemia in chronic kidney disease: Secondary | ICD-10-CM | POA: Diagnosis not present

## 2018-10-18 DIAGNOSIS — I129 Hypertensive chronic kidney disease with stage 1 through stage 4 chronic kidney disease, or unspecified chronic kidney disease: Secondary | ICD-10-CM | POA: Diagnosis not present

## 2018-10-18 DIAGNOSIS — Z981 Arthrodesis status: Secondary | ICD-10-CM | POA: Diagnosis not present

## 2018-10-18 DIAGNOSIS — M48061 Spinal stenosis, lumbar region without neurogenic claudication: Secondary | ICD-10-CM | POA: Diagnosis not present

## 2018-10-18 DIAGNOSIS — Z9181 History of falling: Secondary | ICD-10-CM | POA: Diagnosis not present

## 2018-10-18 DIAGNOSIS — I2510 Atherosclerotic heart disease of native coronary artery without angina pectoris: Secondary | ICD-10-CM | POA: Diagnosis not present

## 2018-10-18 DIAGNOSIS — K5730 Diverticulosis of large intestine without perforation or abscess without bleeding: Secondary | ICD-10-CM | POA: Diagnosis not present

## 2018-10-18 DIAGNOSIS — M1990 Unspecified osteoarthritis, unspecified site: Secondary | ICD-10-CM | POA: Diagnosis not present

## 2018-10-18 DIAGNOSIS — L02212 Cutaneous abscess of back [any part, except buttock]: Secondary | ICD-10-CM | POA: Diagnosis not present

## 2018-10-19 DIAGNOSIS — R809 Proteinuria, unspecified: Secondary | ICD-10-CM | POA: Diagnosis not present

## 2018-10-19 DIAGNOSIS — N184 Chronic kidney disease, stage 4 (severe): Secondary | ICD-10-CM | POA: Diagnosis not present

## 2018-10-19 DIAGNOSIS — D649 Anemia, unspecified: Secondary | ICD-10-CM | POA: Diagnosis not present

## 2018-10-19 DIAGNOSIS — I129 Hypertensive chronic kidney disease with stage 1 through stage 4 chronic kidney disease, or unspecified chronic kidney disease: Secondary | ICD-10-CM | POA: Diagnosis not present

## 2018-10-20 DIAGNOSIS — T8149XA Infection following a procedure, other surgical site, initial encounter: Secondary | ICD-10-CM | POA: Diagnosis not present

## 2018-10-21 ENCOUNTER — Ambulatory Visit: Payer: Self-pay | Admitting: General Surgery

## 2018-10-22 DIAGNOSIS — L02212 Cutaneous abscess of back [any part, except buttock]: Secondary | ICD-10-CM | POA: Diagnosis not present

## 2018-10-22 DIAGNOSIS — K5730 Diverticulosis of large intestine without perforation or abscess without bleeding: Secondary | ICD-10-CM | POA: Diagnosis not present

## 2018-10-22 DIAGNOSIS — E039 Hypothyroidism, unspecified: Secondary | ICD-10-CM | POA: Diagnosis not present

## 2018-10-22 DIAGNOSIS — I2510 Atherosclerotic heart disease of native coronary artery without angina pectoris: Secondary | ICD-10-CM | POA: Diagnosis not present

## 2018-10-22 DIAGNOSIS — I129 Hypertensive chronic kidney disease with stage 1 through stage 4 chronic kidney disease, or unspecified chronic kidney disease: Secondary | ICD-10-CM | POA: Diagnosis not present

## 2018-10-22 DIAGNOSIS — M48061 Spinal stenosis, lumbar region without neurogenic claudication: Secondary | ICD-10-CM | POA: Diagnosis not present

## 2018-10-22 DIAGNOSIS — T8141XA Infection following a procedure, superficial incisional surgical site, initial encounter: Secondary | ICD-10-CM | POA: Diagnosis not present

## 2018-10-22 DIAGNOSIS — Z9181 History of falling: Secondary | ICD-10-CM | POA: Diagnosis not present

## 2018-10-22 DIAGNOSIS — M1990 Unspecified osteoarthritis, unspecified site: Secondary | ICD-10-CM | POA: Diagnosis not present

## 2018-10-22 DIAGNOSIS — Z792 Long term (current) use of antibiotics: Secondary | ICD-10-CM | POA: Diagnosis not present

## 2018-10-22 DIAGNOSIS — E1122 Type 2 diabetes mellitus with diabetic chronic kidney disease: Secondary | ICD-10-CM | POA: Diagnosis not present

## 2018-10-22 DIAGNOSIS — Z981 Arthrodesis status: Secondary | ICD-10-CM | POA: Diagnosis not present

## 2018-10-22 DIAGNOSIS — N183 Chronic kidney disease, stage 3 (moderate): Secondary | ICD-10-CM | POA: Diagnosis not present

## 2018-10-22 DIAGNOSIS — E785 Hyperlipidemia, unspecified: Secondary | ICD-10-CM | POA: Diagnosis not present

## 2018-10-22 DIAGNOSIS — D631 Anemia in chronic kidney disease: Secondary | ICD-10-CM | POA: Diagnosis not present

## 2018-10-25 DIAGNOSIS — M48061 Spinal stenosis, lumbar region without neurogenic claudication: Secondary | ICD-10-CM | POA: Diagnosis not present

## 2018-10-25 DIAGNOSIS — L02212 Cutaneous abscess of back [any part, except buttock]: Secondary | ICD-10-CM | POA: Diagnosis not present

## 2018-10-25 DIAGNOSIS — D631 Anemia in chronic kidney disease: Secondary | ICD-10-CM | POA: Diagnosis not present

## 2018-10-25 DIAGNOSIS — N183 Chronic kidney disease, stage 3 (moderate): Secondary | ICD-10-CM | POA: Diagnosis not present

## 2018-10-25 DIAGNOSIS — Z9181 History of falling: Secondary | ICD-10-CM | POA: Diagnosis not present

## 2018-10-25 DIAGNOSIS — Z981 Arthrodesis status: Secondary | ICD-10-CM | POA: Diagnosis not present

## 2018-10-25 DIAGNOSIS — Z792 Long term (current) use of antibiotics: Secondary | ICD-10-CM | POA: Diagnosis not present

## 2018-10-25 DIAGNOSIS — I129 Hypertensive chronic kidney disease with stage 1 through stage 4 chronic kidney disease, or unspecified chronic kidney disease: Secondary | ICD-10-CM | POA: Diagnosis not present

## 2018-10-25 DIAGNOSIS — E039 Hypothyroidism, unspecified: Secondary | ICD-10-CM | POA: Diagnosis not present

## 2018-10-25 DIAGNOSIS — K5730 Diverticulosis of large intestine without perforation or abscess without bleeding: Secondary | ICD-10-CM | POA: Diagnosis not present

## 2018-10-25 DIAGNOSIS — I2510 Atherosclerotic heart disease of native coronary artery without angina pectoris: Secondary | ICD-10-CM | POA: Diagnosis not present

## 2018-10-25 DIAGNOSIS — E1122 Type 2 diabetes mellitus with diabetic chronic kidney disease: Secondary | ICD-10-CM | POA: Diagnosis not present

## 2018-10-25 DIAGNOSIS — E785 Hyperlipidemia, unspecified: Secondary | ICD-10-CM | POA: Diagnosis not present

## 2018-10-25 DIAGNOSIS — T8141XA Infection following a procedure, superficial incisional surgical site, initial encounter: Secondary | ICD-10-CM | POA: Diagnosis not present

## 2018-10-25 DIAGNOSIS — M1990 Unspecified osteoarthritis, unspecified site: Secondary | ICD-10-CM | POA: Diagnosis not present

## 2018-10-27 DIAGNOSIS — E785 Hyperlipidemia, unspecified: Secondary | ICD-10-CM | POA: Diagnosis not present

## 2018-10-27 DIAGNOSIS — K5730 Diverticulosis of large intestine without perforation or abscess without bleeding: Secondary | ICD-10-CM | POA: Diagnosis not present

## 2018-10-27 DIAGNOSIS — I2510 Atherosclerotic heart disease of native coronary artery without angina pectoris: Secondary | ICD-10-CM | POA: Diagnosis not present

## 2018-10-27 DIAGNOSIS — M1990 Unspecified osteoarthritis, unspecified site: Secondary | ICD-10-CM | POA: Diagnosis not present

## 2018-10-27 DIAGNOSIS — Z9181 History of falling: Secondary | ICD-10-CM | POA: Diagnosis not present

## 2018-10-27 DIAGNOSIS — L02212 Cutaneous abscess of back [any part, except buttock]: Secondary | ICD-10-CM | POA: Diagnosis not present

## 2018-10-27 DIAGNOSIS — N183 Chronic kidney disease, stage 3 (moderate): Secondary | ICD-10-CM | POA: Diagnosis not present

## 2018-10-27 DIAGNOSIS — I129 Hypertensive chronic kidney disease with stage 1 through stage 4 chronic kidney disease, or unspecified chronic kidney disease: Secondary | ICD-10-CM | POA: Diagnosis not present

## 2018-10-27 DIAGNOSIS — D631 Anemia in chronic kidney disease: Secondary | ICD-10-CM | POA: Diagnosis not present

## 2018-10-27 DIAGNOSIS — E039 Hypothyroidism, unspecified: Secondary | ICD-10-CM | POA: Diagnosis not present

## 2018-10-27 DIAGNOSIS — Z792 Long term (current) use of antibiotics: Secondary | ICD-10-CM | POA: Diagnosis not present

## 2018-10-27 DIAGNOSIS — Z981 Arthrodesis status: Secondary | ICD-10-CM | POA: Diagnosis not present

## 2018-10-27 DIAGNOSIS — M48061 Spinal stenosis, lumbar region without neurogenic claudication: Secondary | ICD-10-CM | POA: Diagnosis not present

## 2018-10-27 DIAGNOSIS — T8141XA Infection following a procedure, superficial incisional surgical site, initial encounter: Secondary | ICD-10-CM | POA: Diagnosis not present

## 2018-10-27 DIAGNOSIS — E1122 Type 2 diabetes mellitus with diabetic chronic kidney disease: Secondary | ICD-10-CM | POA: Diagnosis not present

## 2018-11-01 DIAGNOSIS — N183 Chronic kidney disease, stage 3 (moderate): Secondary | ICD-10-CM | POA: Diagnosis not present

## 2018-11-01 DIAGNOSIS — T8141XA Infection following a procedure, superficial incisional surgical site, initial encounter: Secondary | ICD-10-CM | POA: Diagnosis not present

## 2018-11-01 DIAGNOSIS — Z9181 History of falling: Secondary | ICD-10-CM | POA: Diagnosis not present

## 2018-11-01 DIAGNOSIS — I129 Hypertensive chronic kidney disease with stage 1 through stage 4 chronic kidney disease, or unspecified chronic kidney disease: Secondary | ICD-10-CM | POA: Diagnosis not present

## 2018-11-01 DIAGNOSIS — M1990 Unspecified osteoarthritis, unspecified site: Secondary | ICD-10-CM | POA: Diagnosis not present

## 2018-11-01 DIAGNOSIS — E785 Hyperlipidemia, unspecified: Secondary | ICD-10-CM | POA: Diagnosis not present

## 2018-11-01 DIAGNOSIS — E039 Hypothyroidism, unspecified: Secondary | ICD-10-CM | POA: Diagnosis not present

## 2018-11-01 DIAGNOSIS — K5730 Diverticulosis of large intestine without perforation or abscess without bleeding: Secondary | ICD-10-CM | POA: Diagnosis not present

## 2018-11-01 DIAGNOSIS — Z792 Long term (current) use of antibiotics: Secondary | ICD-10-CM | POA: Diagnosis not present

## 2018-11-01 DIAGNOSIS — Z981 Arthrodesis status: Secondary | ICD-10-CM | POA: Diagnosis not present

## 2018-11-01 DIAGNOSIS — I2510 Atherosclerotic heart disease of native coronary artery without angina pectoris: Secondary | ICD-10-CM | POA: Diagnosis not present

## 2018-11-01 DIAGNOSIS — L02212 Cutaneous abscess of back [any part, except buttock]: Secondary | ICD-10-CM | POA: Diagnosis not present

## 2018-11-01 DIAGNOSIS — M48061 Spinal stenosis, lumbar region without neurogenic claudication: Secondary | ICD-10-CM | POA: Diagnosis not present

## 2018-11-01 DIAGNOSIS — E1122 Type 2 diabetes mellitus with diabetic chronic kidney disease: Secondary | ICD-10-CM | POA: Diagnosis not present

## 2018-11-01 DIAGNOSIS — D631 Anemia in chronic kidney disease: Secondary | ICD-10-CM | POA: Diagnosis not present

## 2018-11-04 DIAGNOSIS — T8141XA Infection following a procedure, superficial incisional surgical site, initial encounter: Secondary | ICD-10-CM | POA: Diagnosis not present

## 2018-11-04 DIAGNOSIS — E1122 Type 2 diabetes mellitus with diabetic chronic kidney disease: Secondary | ICD-10-CM | POA: Diagnosis not present

## 2018-11-04 DIAGNOSIS — K5730 Diverticulosis of large intestine without perforation or abscess without bleeding: Secondary | ICD-10-CM | POA: Diagnosis not present

## 2018-11-04 DIAGNOSIS — Z981 Arthrodesis status: Secondary | ICD-10-CM | POA: Diagnosis not present

## 2018-11-04 DIAGNOSIS — N183 Chronic kidney disease, stage 3 (moderate): Secondary | ICD-10-CM | POA: Diagnosis not present

## 2018-11-04 DIAGNOSIS — Z792 Long term (current) use of antibiotics: Secondary | ICD-10-CM | POA: Diagnosis not present

## 2018-11-04 DIAGNOSIS — M1990 Unspecified osteoarthritis, unspecified site: Secondary | ICD-10-CM | POA: Diagnosis not present

## 2018-11-04 DIAGNOSIS — E785 Hyperlipidemia, unspecified: Secondary | ICD-10-CM | POA: Diagnosis not present

## 2018-11-04 DIAGNOSIS — I2510 Atherosclerotic heart disease of native coronary artery without angina pectoris: Secondary | ICD-10-CM | POA: Diagnosis not present

## 2018-11-04 DIAGNOSIS — E039 Hypothyroidism, unspecified: Secondary | ICD-10-CM | POA: Diagnosis not present

## 2018-11-04 DIAGNOSIS — D631 Anemia in chronic kidney disease: Secondary | ICD-10-CM | POA: Diagnosis not present

## 2018-11-04 DIAGNOSIS — Z9181 History of falling: Secondary | ICD-10-CM | POA: Diagnosis not present

## 2018-11-04 DIAGNOSIS — M48061 Spinal stenosis, lumbar region without neurogenic claudication: Secondary | ICD-10-CM | POA: Diagnosis not present

## 2018-11-04 DIAGNOSIS — I129 Hypertensive chronic kidney disease with stage 1 through stage 4 chronic kidney disease, or unspecified chronic kidney disease: Secondary | ICD-10-CM | POA: Diagnosis not present

## 2018-11-04 DIAGNOSIS — L02212 Cutaneous abscess of back [any part, except buttock]: Secondary | ICD-10-CM | POA: Diagnosis not present

## 2018-11-09 DIAGNOSIS — I2510 Atherosclerotic heart disease of native coronary artery without angina pectoris: Secondary | ICD-10-CM | POA: Diagnosis not present

## 2018-11-09 DIAGNOSIS — M48061 Spinal stenosis, lumbar region without neurogenic claudication: Secondary | ICD-10-CM | POA: Diagnosis not present

## 2018-11-09 DIAGNOSIS — E039 Hypothyroidism, unspecified: Secondary | ICD-10-CM | POA: Diagnosis not present

## 2018-11-09 DIAGNOSIS — I129 Hypertensive chronic kidney disease with stage 1 through stage 4 chronic kidney disease, or unspecified chronic kidney disease: Secondary | ICD-10-CM | POA: Diagnosis not present

## 2018-11-09 DIAGNOSIS — E1122 Type 2 diabetes mellitus with diabetic chronic kidney disease: Secondary | ICD-10-CM | POA: Diagnosis not present

## 2018-11-09 DIAGNOSIS — Z981 Arthrodesis status: Secondary | ICD-10-CM | POA: Diagnosis not present

## 2018-11-09 DIAGNOSIS — L02212 Cutaneous abscess of back [any part, except buttock]: Secondary | ICD-10-CM | POA: Diagnosis not present

## 2018-11-09 DIAGNOSIS — D631 Anemia in chronic kidney disease: Secondary | ICD-10-CM | POA: Diagnosis not present

## 2018-11-09 DIAGNOSIS — N183 Chronic kidney disease, stage 3 (moderate): Secondary | ICD-10-CM | POA: Diagnosis not present

## 2018-11-09 DIAGNOSIS — M1990 Unspecified osteoarthritis, unspecified site: Secondary | ICD-10-CM | POA: Diagnosis not present

## 2018-11-09 DIAGNOSIS — Z792 Long term (current) use of antibiotics: Secondary | ICD-10-CM | POA: Diagnosis not present

## 2018-11-09 DIAGNOSIS — T8141XA Infection following a procedure, superficial incisional surgical site, initial encounter: Secondary | ICD-10-CM | POA: Diagnosis not present

## 2018-11-09 DIAGNOSIS — Z9181 History of falling: Secondary | ICD-10-CM | POA: Diagnosis not present

## 2018-11-09 DIAGNOSIS — E785 Hyperlipidemia, unspecified: Secondary | ICD-10-CM | POA: Diagnosis not present

## 2018-11-09 DIAGNOSIS — K5730 Diverticulosis of large intestine without perforation or abscess without bleeding: Secondary | ICD-10-CM | POA: Diagnosis not present

## 2018-11-10 ENCOUNTER — Ambulatory Visit: Payer: Medicare Other | Admitting: Infectious Diseases

## 2018-11-12 DIAGNOSIS — E1122 Type 2 diabetes mellitus with diabetic chronic kidney disease: Secondary | ICD-10-CM | POA: Diagnosis not present

## 2018-11-12 DIAGNOSIS — E039 Hypothyroidism, unspecified: Secondary | ICD-10-CM | POA: Diagnosis not present

## 2018-11-12 DIAGNOSIS — T8141XA Infection following a procedure, superficial incisional surgical site, initial encounter: Secondary | ICD-10-CM | POA: Diagnosis not present

## 2018-11-12 DIAGNOSIS — L02212 Cutaneous abscess of back [any part, except buttock]: Secondary | ICD-10-CM | POA: Diagnosis not present

## 2018-11-12 DIAGNOSIS — I129 Hypertensive chronic kidney disease with stage 1 through stage 4 chronic kidney disease, or unspecified chronic kidney disease: Secondary | ICD-10-CM | POA: Diagnosis not present

## 2018-11-12 DIAGNOSIS — N183 Chronic kidney disease, stage 3 (moderate): Secondary | ICD-10-CM | POA: Diagnosis not present

## 2018-11-12 DIAGNOSIS — Z981 Arthrodesis status: Secondary | ICD-10-CM | POA: Diagnosis not present

## 2018-11-12 DIAGNOSIS — Z9181 History of falling: Secondary | ICD-10-CM | POA: Diagnosis not present

## 2018-11-12 DIAGNOSIS — I2510 Atherosclerotic heart disease of native coronary artery without angina pectoris: Secondary | ICD-10-CM | POA: Diagnosis not present

## 2018-11-12 DIAGNOSIS — M48061 Spinal stenosis, lumbar region without neurogenic claudication: Secondary | ICD-10-CM | POA: Diagnosis not present

## 2018-11-12 DIAGNOSIS — K5730 Diverticulosis of large intestine without perforation or abscess without bleeding: Secondary | ICD-10-CM | POA: Diagnosis not present

## 2018-11-12 DIAGNOSIS — E785 Hyperlipidemia, unspecified: Secondary | ICD-10-CM | POA: Diagnosis not present

## 2018-11-12 DIAGNOSIS — D631 Anemia in chronic kidney disease: Secondary | ICD-10-CM | POA: Diagnosis not present

## 2018-11-12 DIAGNOSIS — Z792 Long term (current) use of antibiotics: Secondary | ICD-10-CM | POA: Diagnosis not present

## 2018-11-12 DIAGNOSIS — M1990 Unspecified osteoarthritis, unspecified site: Secondary | ICD-10-CM | POA: Diagnosis not present

## 2018-11-15 ENCOUNTER — Ambulatory Visit: Payer: Medicare Other | Admitting: Infectious Diseases

## 2018-11-15 ENCOUNTER — Ambulatory Visit: Payer: Medicare Other | Admitting: Internal Medicine

## 2018-11-16 DIAGNOSIS — Z981 Arthrodesis status: Secondary | ICD-10-CM | POA: Diagnosis not present

## 2018-11-16 DIAGNOSIS — D631 Anemia in chronic kidney disease: Secondary | ICD-10-CM | POA: Diagnosis not present

## 2018-11-16 DIAGNOSIS — K5730 Diverticulosis of large intestine without perforation or abscess without bleeding: Secondary | ICD-10-CM | POA: Diagnosis not present

## 2018-11-16 DIAGNOSIS — L02212 Cutaneous abscess of back [any part, except buttock]: Secondary | ICD-10-CM | POA: Diagnosis not present

## 2018-11-16 DIAGNOSIS — Z792 Long term (current) use of antibiotics: Secondary | ICD-10-CM | POA: Diagnosis not present

## 2018-11-16 DIAGNOSIS — T8141XA Infection following a procedure, superficial incisional surgical site, initial encounter: Secondary | ICD-10-CM | POA: Diagnosis not present

## 2018-11-16 DIAGNOSIS — M1990 Unspecified osteoarthritis, unspecified site: Secondary | ICD-10-CM | POA: Diagnosis not present

## 2018-11-16 DIAGNOSIS — I129 Hypertensive chronic kidney disease with stage 1 through stage 4 chronic kidney disease, or unspecified chronic kidney disease: Secondary | ICD-10-CM | POA: Diagnosis not present

## 2018-11-16 DIAGNOSIS — E1122 Type 2 diabetes mellitus with diabetic chronic kidney disease: Secondary | ICD-10-CM | POA: Diagnosis not present

## 2018-11-16 DIAGNOSIS — N183 Chronic kidney disease, stage 3 (moderate): Secondary | ICD-10-CM | POA: Diagnosis not present

## 2018-11-16 DIAGNOSIS — Z9181 History of falling: Secondary | ICD-10-CM | POA: Diagnosis not present

## 2018-11-16 DIAGNOSIS — I2510 Atherosclerotic heart disease of native coronary artery without angina pectoris: Secondary | ICD-10-CM | POA: Diagnosis not present

## 2018-11-16 DIAGNOSIS — E785 Hyperlipidemia, unspecified: Secondary | ICD-10-CM | POA: Diagnosis not present

## 2018-11-16 DIAGNOSIS — E039 Hypothyroidism, unspecified: Secondary | ICD-10-CM | POA: Diagnosis not present

## 2018-11-16 DIAGNOSIS — M48061 Spinal stenosis, lumbar region without neurogenic claudication: Secondary | ICD-10-CM | POA: Diagnosis not present

## 2018-11-18 DIAGNOSIS — N184 Chronic kidney disease, stage 4 (severe): Secondary | ICD-10-CM | POA: Diagnosis not present

## 2018-11-18 DIAGNOSIS — R809 Proteinuria, unspecified: Secondary | ICD-10-CM | POA: Diagnosis not present

## 2018-11-18 DIAGNOSIS — D649 Anemia, unspecified: Secondary | ICD-10-CM | POA: Diagnosis not present

## 2018-11-18 DIAGNOSIS — I129 Hypertensive chronic kidney disease with stage 1 through stage 4 chronic kidney disease, or unspecified chronic kidney disease: Secondary | ICD-10-CM | POA: Diagnosis not present

## 2018-11-19 DIAGNOSIS — I2510 Atherosclerotic heart disease of native coronary artery without angina pectoris: Secondary | ICD-10-CM | POA: Diagnosis not present

## 2018-11-19 DIAGNOSIS — Z792 Long term (current) use of antibiotics: Secondary | ICD-10-CM | POA: Diagnosis not present

## 2018-11-19 DIAGNOSIS — E039 Hypothyroidism, unspecified: Secondary | ICD-10-CM | POA: Diagnosis not present

## 2018-11-19 DIAGNOSIS — N183 Chronic kidney disease, stage 3 (moderate): Secondary | ICD-10-CM | POA: Diagnosis not present

## 2018-11-19 DIAGNOSIS — Z9181 History of falling: Secondary | ICD-10-CM | POA: Diagnosis not present

## 2018-11-19 DIAGNOSIS — L02212 Cutaneous abscess of back [any part, except buttock]: Secondary | ICD-10-CM | POA: Diagnosis not present

## 2018-11-19 DIAGNOSIS — K5730 Diverticulosis of large intestine without perforation or abscess without bleeding: Secondary | ICD-10-CM | POA: Diagnosis not present

## 2018-11-19 DIAGNOSIS — T8141XA Infection following a procedure, superficial incisional surgical site, initial encounter: Secondary | ICD-10-CM | POA: Diagnosis not present

## 2018-11-19 DIAGNOSIS — I129 Hypertensive chronic kidney disease with stage 1 through stage 4 chronic kidney disease, or unspecified chronic kidney disease: Secondary | ICD-10-CM | POA: Diagnosis not present

## 2018-11-19 DIAGNOSIS — E785 Hyperlipidemia, unspecified: Secondary | ICD-10-CM | POA: Diagnosis not present

## 2018-11-19 DIAGNOSIS — M1990 Unspecified osteoarthritis, unspecified site: Secondary | ICD-10-CM | POA: Diagnosis not present

## 2018-11-19 DIAGNOSIS — M48061 Spinal stenosis, lumbar region without neurogenic claudication: Secondary | ICD-10-CM | POA: Diagnosis not present

## 2018-11-19 DIAGNOSIS — E1122 Type 2 diabetes mellitus with diabetic chronic kidney disease: Secondary | ICD-10-CM | POA: Diagnosis not present

## 2018-11-19 DIAGNOSIS — Z981 Arthrodesis status: Secondary | ICD-10-CM | POA: Diagnosis not present

## 2018-11-19 DIAGNOSIS — D631 Anemia in chronic kidney disease: Secondary | ICD-10-CM | POA: Diagnosis not present

## 2018-11-23 DIAGNOSIS — K5730 Diverticulosis of large intestine without perforation or abscess without bleeding: Secondary | ICD-10-CM | POA: Diagnosis not present

## 2018-11-23 DIAGNOSIS — Z9181 History of falling: Secondary | ICD-10-CM | POA: Diagnosis not present

## 2018-11-23 DIAGNOSIS — N183 Chronic kidney disease, stage 3 (moderate): Secondary | ICD-10-CM | POA: Diagnosis not present

## 2018-11-23 DIAGNOSIS — M1990 Unspecified osteoarthritis, unspecified site: Secondary | ICD-10-CM | POA: Diagnosis not present

## 2018-11-23 DIAGNOSIS — E039 Hypothyroidism, unspecified: Secondary | ICD-10-CM | POA: Diagnosis not present

## 2018-11-23 DIAGNOSIS — I2510 Atherosclerotic heart disease of native coronary artery without angina pectoris: Secondary | ICD-10-CM | POA: Diagnosis not present

## 2018-11-23 DIAGNOSIS — E1122 Type 2 diabetes mellitus with diabetic chronic kidney disease: Secondary | ICD-10-CM | POA: Diagnosis not present

## 2018-11-23 DIAGNOSIS — T8141XA Infection following a procedure, superficial incisional surgical site, initial encounter: Secondary | ICD-10-CM | POA: Diagnosis not present

## 2018-11-23 DIAGNOSIS — D631 Anemia in chronic kidney disease: Secondary | ICD-10-CM | POA: Diagnosis not present

## 2018-11-23 DIAGNOSIS — I129 Hypertensive chronic kidney disease with stage 1 through stage 4 chronic kidney disease, or unspecified chronic kidney disease: Secondary | ICD-10-CM | POA: Diagnosis not present

## 2018-11-23 DIAGNOSIS — E785 Hyperlipidemia, unspecified: Secondary | ICD-10-CM | POA: Diagnosis not present

## 2018-11-23 DIAGNOSIS — Z792 Long term (current) use of antibiotics: Secondary | ICD-10-CM | POA: Diagnosis not present

## 2018-11-23 DIAGNOSIS — L02212 Cutaneous abscess of back [any part, except buttock]: Secondary | ICD-10-CM | POA: Diagnosis not present

## 2018-11-23 DIAGNOSIS — Z981 Arthrodesis status: Secondary | ICD-10-CM | POA: Diagnosis not present

## 2018-11-23 DIAGNOSIS — M48061 Spinal stenosis, lumbar region without neurogenic claudication: Secondary | ICD-10-CM | POA: Diagnosis not present

## 2018-11-26 DIAGNOSIS — D631 Anemia in chronic kidney disease: Secondary | ICD-10-CM | POA: Diagnosis not present

## 2018-11-26 DIAGNOSIS — I129 Hypertensive chronic kidney disease with stage 1 through stage 4 chronic kidney disease, or unspecified chronic kidney disease: Secondary | ICD-10-CM | POA: Diagnosis not present

## 2018-11-26 DIAGNOSIS — E039 Hypothyroidism, unspecified: Secondary | ICD-10-CM | POA: Diagnosis not present

## 2018-11-26 DIAGNOSIS — I2510 Atherosclerotic heart disease of native coronary artery without angina pectoris: Secondary | ICD-10-CM | POA: Diagnosis not present

## 2018-11-26 DIAGNOSIS — T8141XA Infection following a procedure, superficial incisional surgical site, initial encounter: Secondary | ICD-10-CM | POA: Diagnosis not present

## 2018-11-26 DIAGNOSIS — Z9181 History of falling: Secondary | ICD-10-CM | POA: Diagnosis not present

## 2018-11-26 DIAGNOSIS — N183 Chronic kidney disease, stage 3 (moderate): Secondary | ICD-10-CM | POA: Diagnosis not present

## 2018-11-26 DIAGNOSIS — K5730 Diverticulosis of large intestine without perforation or abscess without bleeding: Secondary | ICD-10-CM | POA: Diagnosis not present

## 2018-11-26 DIAGNOSIS — E1122 Type 2 diabetes mellitus with diabetic chronic kidney disease: Secondary | ICD-10-CM | POA: Diagnosis not present

## 2018-11-26 DIAGNOSIS — Z981 Arthrodesis status: Secondary | ICD-10-CM | POA: Diagnosis not present

## 2018-11-26 DIAGNOSIS — L02212 Cutaneous abscess of back [any part, except buttock]: Secondary | ICD-10-CM | POA: Diagnosis not present

## 2018-11-26 DIAGNOSIS — Z792 Long term (current) use of antibiotics: Secondary | ICD-10-CM | POA: Diagnosis not present

## 2018-11-26 DIAGNOSIS — E785 Hyperlipidemia, unspecified: Secondary | ICD-10-CM | POA: Diagnosis not present

## 2018-11-26 DIAGNOSIS — M1990 Unspecified osteoarthritis, unspecified site: Secondary | ICD-10-CM | POA: Diagnosis not present

## 2018-11-26 DIAGNOSIS — M48061 Spinal stenosis, lumbar region without neurogenic claudication: Secondary | ICD-10-CM | POA: Diagnosis not present

## 2018-11-30 DIAGNOSIS — Z792 Long term (current) use of antibiotics: Secondary | ICD-10-CM | POA: Diagnosis not present

## 2018-11-30 DIAGNOSIS — M1990 Unspecified osteoarthritis, unspecified site: Secondary | ICD-10-CM | POA: Diagnosis not present

## 2018-11-30 DIAGNOSIS — E1122 Type 2 diabetes mellitus with diabetic chronic kidney disease: Secondary | ICD-10-CM | POA: Diagnosis not present

## 2018-11-30 DIAGNOSIS — Z9181 History of falling: Secondary | ICD-10-CM | POA: Diagnosis not present

## 2018-11-30 DIAGNOSIS — L02212 Cutaneous abscess of back [any part, except buttock]: Secondary | ICD-10-CM | POA: Diagnosis not present

## 2018-11-30 DIAGNOSIS — N183 Chronic kidney disease, stage 3 (moderate): Secondary | ICD-10-CM | POA: Diagnosis not present

## 2018-11-30 DIAGNOSIS — I2510 Atherosclerotic heart disease of native coronary artery without angina pectoris: Secondary | ICD-10-CM | POA: Diagnosis not present

## 2018-11-30 DIAGNOSIS — K5730 Diverticulosis of large intestine without perforation or abscess without bleeding: Secondary | ICD-10-CM | POA: Diagnosis not present

## 2018-11-30 DIAGNOSIS — D631 Anemia in chronic kidney disease: Secondary | ICD-10-CM | POA: Diagnosis not present

## 2018-11-30 DIAGNOSIS — Z981 Arthrodesis status: Secondary | ICD-10-CM | POA: Diagnosis not present

## 2018-11-30 DIAGNOSIS — M48061 Spinal stenosis, lumbar region without neurogenic claudication: Secondary | ICD-10-CM | POA: Diagnosis not present

## 2018-11-30 DIAGNOSIS — E785 Hyperlipidemia, unspecified: Secondary | ICD-10-CM | POA: Diagnosis not present

## 2018-11-30 DIAGNOSIS — E039 Hypothyroidism, unspecified: Secondary | ICD-10-CM | POA: Diagnosis not present

## 2018-11-30 DIAGNOSIS — I129 Hypertensive chronic kidney disease with stage 1 through stage 4 chronic kidney disease, or unspecified chronic kidney disease: Secondary | ICD-10-CM | POA: Diagnosis not present

## 2018-11-30 DIAGNOSIS — T8141XA Infection following a procedure, superficial incisional surgical site, initial encounter: Secondary | ICD-10-CM | POA: Diagnosis not present

## 2018-12-03 ENCOUNTER — Ambulatory Visit: Payer: Self-pay | Admitting: Infectious Diseases

## 2018-12-03 DIAGNOSIS — Z792 Long term (current) use of antibiotics: Secondary | ICD-10-CM | POA: Diagnosis not present

## 2018-12-03 DIAGNOSIS — K5730 Diverticulosis of large intestine without perforation or abscess without bleeding: Secondary | ICD-10-CM | POA: Diagnosis not present

## 2018-12-03 DIAGNOSIS — E039 Hypothyroidism, unspecified: Secondary | ICD-10-CM | POA: Diagnosis not present

## 2018-12-03 DIAGNOSIS — D631 Anemia in chronic kidney disease: Secondary | ICD-10-CM | POA: Diagnosis not present

## 2018-12-03 DIAGNOSIS — E785 Hyperlipidemia, unspecified: Secondary | ICD-10-CM | POA: Diagnosis not present

## 2018-12-03 DIAGNOSIS — Z9181 History of falling: Secondary | ICD-10-CM | POA: Diagnosis not present

## 2018-12-03 DIAGNOSIS — M1990 Unspecified osteoarthritis, unspecified site: Secondary | ICD-10-CM | POA: Diagnosis not present

## 2018-12-03 DIAGNOSIS — I2510 Atherosclerotic heart disease of native coronary artery without angina pectoris: Secondary | ICD-10-CM | POA: Diagnosis not present

## 2018-12-03 DIAGNOSIS — L02212 Cutaneous abscess of back [any part, except buttock]: Secondary | ICD-10-CM | POA: Diagnosis not present

## 2018-12-03 DIAGNOSIS — T8141XA Infection following a procedure, superficial incisional surgical site, initial encounter: Secondary | ICD-10-CM | POA: Diagnosis not present

## 2018-12-03 DIAGNOSIS — N183 Chronic kidney disease, stage 3 (moderate): Secondary | ICD-10-CM | POA: Diagnosis not present

## 2018-12-03 DIAGNOSIS — Z981 Arthrodesis status: Secondary | ICD-10-CM | POA: Diagnosis not present

## 2018-12-03 DIAGNOSIS — E1122 Type 2 diabetes mellitus with diabetic chronic kidney disease: Secondary | ICD-10-CM | POA: Diagnosis not present

## 2018-12-03 DIAGNOSIS — I129 Hypertensive chronic kidney disease with stage 1 through stage 4 chronic kidney disease, or unspecified chronic kidney disease: Secondary | ICD-10-CM | POA: Diagnosis not present

## 2018-12-03 DIAGNOSIS — M48061 Spinal stenosis, lumbar region without neurogenic claudication: Secondary | ICD-10-CM | POA: Diagnosis not present

## 2018-12-06 DIAGNOSIS — T8141XA Infection following a procedure, superficial incisional surgical site, initial encounter: Secondary | ICD-10-CM | POA: Diagnosis not present

## 2018-12-06 DIAGNOSIS — Z792 Long term (current) use of antibiotics: Secondary | ICD-10-CM | POA: Diagnosis not present

## 2018-12-06 DIAGNOSIS — M48061 Spinal stenosis, lumbar region without neurogenic claudication: Secondary | ICD-10-CM | POA: Diagnosis not present

## 2018-12-06 DIAGNOSIS — L02212 Cutaneous abscess of back [any part, except buttock]: Secondary | ICD-10-CM | POA: Diagnosis not present

## 2018-12-06 DIAGNOSIS — E785 Hyperlipidemia, unspecified: Secondary | ICD-10-CM | POA: Diagnosis not present

## 2018-12-06 DIAGNOSIS — I2510 Atherosclerotic heart disease of native coronary artery without angina pectoris: Secondary | ICD-10-CM | POA: Diagnosis not present

## 2018-12-06 DIAGNOSIS — Z9181 History of falling: Secondary | ICD-10-CM | POA: Diagnosis not present

## 2018-12-06 DIAGNOSIS — Z981 Arthrodesis status: Secondary | ICD-10-CM | POA: Diagnosis not present

## 2018-12-06 DIAGNOSIS — M1990 Unspecified osteoarthritis, unspecified site: Secondary | ICD-10-CM | POA: Diagnosis not present

## 2018-12-06 DIAGNOSIS — E039 Hypothyroidism, unspecified: Secondary | ICD-10-CM | POA: Diagnosis not present

## 2018-12-06 DIAGNOSIS — D631 Anemia in chronic kidney disease: Secondary | ICD-10-CM | POA: Diagnosis not present

## 2018-12-06 DIAGNOSIS — K5730 Diverticulosis of large intestine without perforation or abscess without bleeding: Secondary | ICD-10-CM | POA: Diagnosis not present

## 2018-12-06 DIAGNOSIS — N183 Chronic kidney disease, stage 3 (moderate): Secondary | ICD-10-CM | POA: Diagnosis not present

## 2018-12-06 DIAGNOSIS — E1122 Type 2 diabetes mellitus with diabetic chronic kidney disease: Secondary | ICD-10-CM | POA: Diagnosis not present

## 2018-12-06 DIAGNOSIS — I129 Hypertensive chronic kidney disease with stage 1 through stage 4 chronic kidney disease, or unspecified chronic kidney disease: Secondary | ICD-10-CM | POA: Diagnosis not present

## 2018-12-07 ENCOUNTER — Ambulatory Visit (INDEPENDENT_AMBULATORY_CARE_PROVIDER_SITE_OTHER): Payer: Medicare Other

## 2018-12-07 ENCOUNTER — Ambulatory Visit: Payer: Self-pay

## 2018-12-07 ENCOUNTER — Telehealth: Payer: Self-pay | Admitting: Infectious Diseases

## 2018-12-07 ENCOUNTER — Other Ambulatory Visit: Payer: Self-pay

## 2018-12-07 ENCOUNTER — Ambulatory Visit: Payer: Self-pay | Admitting: Internal Medicine

## 2018-12-07 DIAGNOSIS — Z0000 Encounter for general adult medical examination without abnormal findings: Secondary | ICD-10-CM | POA: Diagnosis not present

## 2018-12-07 NOTE — Progress Notes (Signed)
Subjective:   Taylor Tyler is a 78 y.o. female who presents for Medicare Annual (Subsequent) preventive examination.  Review of Systems:  No ROS.  Medicare Wellness Virtual Visit.  Visual/audio telehealth visit, UTA vital signs.   See social history for additional risk factors.  Cardiac Risk Factors include: advanced age (>34men, >32 women);diabetes mellitus;hypertension     Objective:     Vitals: There were no vitals taken for this visit.  There is no height or weight on file to calculate BMI.  Advanced Directives 12/07/2018 10/07/2018 06/24/2018 06/10/2018 06/04/2018 06/03/2018 12/01/2017  Does Patient Have a Medical Advance Directive? Yes Yes No No No No Yes  Type of Advance Directive Healthcare Power of [place]  Does patient want to make changes to medical advance directive? - No - Patient declined - - - - No - Patient declined  Copy of [place] in Chart? No - copy requested - - - - - No - copy requested  Would patient like information on creating a medical advance directive? - - No - Patient declined No - Patient declined No - Patient declined No - Patient declined -  Pre-existing out of facility DNR order (yellow form or pink MOST form) - - - - - - -    Tobacco Social History   Tobacco Use  Smoking Status Never Smoker  Smokeless Tobacco Never Used     Counseling given: Not Answered   Clinical Intake:  Pre-visit preparation completed: Yes        Diabetes: Yes(Followed by pcp)  How often do you need to have someone help you when you read instructions, pamphlets, or other written materials from your doctor or pharmacy?: 1 - Never  Interpreter Needed?: No     Past Medical History:  Diagnosis Date  . Acute blood loss anemia 03/14/2017  . Acute renal failure superimposed on stage 3 chronic kidney disease ([place]) 03/14/2017  . AKI (acute kidney injury) ([place]) 02/28/2017  .  Altered mental status 03/29/2017  . Anemia   . Aortic atherosclerosis ([place])   . Arthritis    "joints might ache at times; not that bad" (06/10/2018)  . Bilateral lower extremity edema 03/04/2017  . Bradycardia   . Chronic kidney disease    ?? renal insufficiency,   . CKD (chronic kidney disease) stage 3, GFR 30-59 ml/min ([facility]) 01/15/2017   ?? renal insufficiency, which she thinks is coming from "all these medications"  . Diabetes mellitus without complication ([place])    diagnosed 4-5 yrs ago, 06/21/18- "that was years ago"  . Disease of pancreas   . Diverticulitis    s/p perforation and partial colectomy 01/27/14 with 3 benign lymph nodes   . Diverticulosis 01/15/2017  . DJD (degenerative joint disease)   . DM (diabetes mellitus), type 2 with renal complications ([place]) 9/98/3382  . Fatty liver   . Hypertension   . Hypothyroidism    "had radiation" (06/10/2018)  . Kidney stone   . Lethargy 02/28/2017  . Obesity, Class III, BMI 40-49.9 (morbid obesity) ([place]) 02/27/2017  . Pleural lipoma   . Postoperative wound infection 04/02/2017  . Spinal stenosis of lumbar region 01/15/2017  . Status post lumbar surgery   . Wound healing, delayed    back   Past Surgical History:  Procedure Laterality Date  . ABDOMINAL EXPOSURE N/A 02/24/2017   Procedure: ABDOMINAL EXPOSURE;  Surgeon: Angelia Mould, MD;  Location:  [facility];  Service: Vascular;  Laterality: N/A;  . ABDOMINAL HYSTERECTOMY  1987   no h/o abnormal paps   . ANTERIOR LAT LUMBAR FUSION N/A 02/24/2017   Procedure: Lumbar three- five Anterior lateral lumbar interbody fusion;  Surgeon: Ditty, Kevan Ny, MD;  Location: [facility];  Service: Neurosurgery;  Laterality: N/A;  L3-5 Anterior lateral lumbar interbody fusion with removal of coflex at L3-4, L4-5  . ANTERIOR LUMBAR FUSION N/A 02/24/2017   Procedure: Stage 1: Lumbar five-Sacral one Anterior lumbar interbody fusion;  Surgeon: Ditty, Kevan Ny, MD;  Location: [facility];  Service:  Neurosurgery;  Laterality: N/A;  Stage 1: L5-S1 Anterior lumbar interbody fusion  . APPENDECTOMY    . APPLICATION OF ROBOTIC ASSISTANCE FOR SPINAL PROCEDURE N/A 02/26/2017   Procedure: APPLICATION OF ROBOTIC ASSISTANCE FOR SPINAL PROCEDURE;  Surgeon: Ditty, Kevan Ny, MD;  Location: [facility];  Service: Neurosurgery;  Laterality: N/A;  . APPLICATION OF WOUND VAC N/A 03/30/2017   Procedure: APPLICATION OF WOUND VAC;  Surgeon: Ditty, Kevan Ny, MD;  Location: [facility];  Service: Neurosurgery;  Laterality: N/A;  . BACK SURGERY  2013  . CATARACT EXTRACTION W/ INTRAOCULAR LENS IMPLANT Right   . CHOLECYSTECTOMY OPEN  1982  . COLON SURGERY  01/26/2014   desc.sigmoid colectomy and ventral hernia repair and splenic flexure mobilization  . COLON SURGERY    . DILATION AND CURETTAGE OF UTERUS    . HERNIA REPAIR    . LUMBAR LAMINECTOMY WITH SPINOUS PROCESS PLATE 2 LEVEL N/A 0/97/3532   Procedure: LUMBAR LAMINECTOMY/DECOMPRESSION MICRODISCECTOMY CoFlex;  Surgeon: Faythe Ghee, MD;  Location: [facility];  Service: Neurosurgery;  Laterality: N/A;  Lumbar three-four,Lumbar Four-Five Laminectomy with Coflex  . LUMBAR WOUND DEBRIDEMENT N/A 03/30/2017   Procedure: Lumbar wound exploration/debridement, placement of wound vac;  Surgeon: Ditty, Kevan Ny, MD;  Location: [facility];  Service: Neurosurgery;  Laterality: N/A;  Lumbar wound exploration/debridement, placement of wound vac  . LUMBAR WOUND DEBRIDEMENT N/A 06/06/2018   Procedure: LUMBAR WOUND DEBRIDEMENT/EXPLORATION;  Surgeon: Consuella Lose, MD;  Location: [facility];  Service: Neurosurgery;  Laterality: N/A;  . LUMBAR WOUND DEBRIDEMENT N/A 06/22/2018   Procedure: SIMPLE INCISION AND DRAINAGE OF WOUND, APPLICATION OF WOUND VAC;  Surgeon: Consuella Lose, MD;  Location: [facility];  Service: Neurosurgery;  Laterality: N/A;  . PARTIAL COLECTOMY     01/27/14 diverticulitis and 3 benign lymph nodes [facility] Dr. Pat Patrick   . REDUCTION MAMMAPLASTY Bilateral 1992  .  TUBAL LIGATION    . VENTRAL HERNIA REPAIR  2014   Family History  Problem Relation Age of Onset  . Diabetes Father   . Heart attack Father   . Hypertension Father   . Heart disease Father   . Kidney disease Father   . Diabetes Mother   . Hypertension Mother   . Heart disease Mother   . Kidney disease Mother   . Lupus Sister   . Heart disease Brother   . Diabetes Brother   . Heart disease Brother   . Diabetes Brother   . Lupus Sister    Social History   Socioeconomic History  . Marital status: Widowed    Spouse name: Not on file  . Number of children: Not on file  . Years of education: Not on file  . Highest education level: Not on file  Occupational History  . Occupation: retired Economist work  Scientific laboratory technician  . Financial resource strain: Not hard at all  . Food insecurity:    Worry:  Never true    Inability: Never true  . Transportation needs:    Medical: No    Non-medical: No  Tobacco Use  . Smoking status: Never Smoker  . Smokeless tobacco: Never Used  Substance and Sexual Activity  . Alcohol use: Not Currently    Frequency: Never    Comment: "nothing since age 59" (06/10/2018)  . Drug use: Never  . Sexual activity: Not Currently  Lifestyle  . Physical activity:    Days per week: 0 days    Minutes per session: Not on file  . Stress: Not on file  Relationships  . Social connections:    Talks on phone: Not on file    Gets together: Not on file    Attends religious service: Not on file    Active member of club or organization: Not on file    Attends meetings of clubs or organizations: Not on file    Relationship status: Not on file  Other Topics Concern  . 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 acetaminophen (TYLENOL) 500 MG tablet Take 500 mg by mouth 2 (two) times daily as needed for mild pain.   Marland Kitchen amLODipine (NORVASC) 10 MG tablet Take 1 tablet (10 mg total) by mouth daily. (Patient not taking: Reported on 10/01/2018)  . atorvastatin (LIPITOR) 40 MG tablet Take 1 tablet (40 mg total) by mouth daily at 6 PM.  . carvedilol (COREG) 25 MG tablet Take 1 tablet (25 mg total) by mouth 2 (two) times daily with a meal. (Patient not taking: Reported on 10/01/2018)  . ferrous sulfate 325 (65 FE) MG tablet Take 325 mg by mouth daily with breakfast.  . furosemide (LASIX) 40 MG tablet Take 1 tablet (40 mg total) by mouth daily. In am  . gabapentin (NEURONTIN) 300 MG capsule Take 300 mg by mouth daily as needed (nerve pain).  . hydrALAZINE (APRESOLINE) 50 MG tablet Take 1 tablet (50 mg total) by mouth every 8 (eight) hours. (Patient not taking: Reported on 10/01/2018)  . isosorbide mononitrate (IMDUR) 30 MG 24 hr tablet Take 1 tablet (30 mg total) by mouth daily. (Patient not taking: Reported on 10/01/2018)  . labetalol (NORMODYNE) 100 MG tablet Take 100 mg by mouth 2 (two) times daily.  Marland Kitchen levothyroxine (SYNTHROID, LEVOTHROID) 100 MCG tablet Take 100 mcg by mouth daily before breakfast.   No facility-administered encounter medications on file as of 12/07/2018.     Activities of Daily Living In your present state of health, do you have any difficulty performing the following activities: 12/07/2018 10/07/2018  Hearing? N N  Vision? N N  Difficulty concentrating or making decisions? N N  Walking or climbing stairs? N N  Dressing or bathing? N N  Doing errands, shopping? [place] and eating ? N -  Using the Toilet? N -  In the past six months, have you accidently leaked urine? N -  Do you have problems with loss of bowel control? N -  Managing your Medications? N -  Managing your Finances? N -  Housekeeping or managing your Housekeeping? N -  Some recent data might be hidden     Patient Care Team: McLean-Scocuzza, Nino Glow, MD as PCP - General (Internal Medicine) Ditty, Kevan Ny, MD as Consulting Physician (Neurosurgery)    Assessment:   This is a routine wellness examination for [facility].  I connected with patient 12/07/18 at 11:00 AM EDT by an audio enabled telemedicine application and verified that I am speaking with the correct person using two identifiers. Patient stated full name and DOB. Patient gave permission to continue with virtual visit. Patient's location was at home and Nurse's location was at [place] office.   Health Screenings  Mammogram - 08/2017 Colonoscopy - 01/2014 Glaucoma -none Hearing -demonstrates normal hearing during visit. Dental- [facility] [place] followed by pcp  Social  Alcohol intake - no     Smoking history- never    Smokers in home? none Illicit drug use? none Exercise - walking Diet - low carb Sexually Active -not currently BMI- discussed the importance of a healthy diet, water intake and the benefits of aerobic exercise.  Educational material provided.   Safety  Patient feels safe at home- yes Patient does have smoke detectors at home- yes Patient does wear sunscreen or protective clothing when in direct sunlight -yes Patient does wear seat belt when in a moving vehicle -yes  Activities of Daily Living Patient does not need assistance doing their own household chores. Denies needing assistance with: driving, feeding themselves, getting from bed to chair, getting to the toilet, bathing/showering, dressing, managing money, or preparing meals.  No new identified risk were noted.    Depression Screen Patient denies losing interest in daily life, feeling hopeless, or crying easily over simple problems.   Medication-taking as directed and without issues.   Fall Screen Patient denies being afraid of falling or falling in the last year.   Memory Screen Patient denies problems with memory, misplacing items.  Patient is alert. Correctly identified the president of the [place] and recall of 2/3 objects.  Immunizations The following Immunizations were discussed: Influenza, shingles, pneumonia, and tetanus.   Other Providers Patient Care Team: McLean-Scocuzza, Nino Glow, MD as PCP - General (Internal Medicine) Ditty, Kevan Ny, MD as Consulting Physician (Neurosurgery)  Exercise Activities and Dietary recommendations Current Exercise Habits: Home exercise routine, Intensity: Mild  Goals      Patient Stated   . Maintain Healthy Lifestyle (pt-stated)     Monitor diet Watch weight gain Drink plenty of fluids       Fall Risk Fall Risk  12/07/2018 07/26/2018 07/14/2018 04/19/2018 12/01/2017  Falls in the past year? 0 (No Data) 0 No No  Comment - Denies new/ recent falls - - -  Number falls in past yr: - - 0 - -  Injury with Fall? - - 0 - -  Comment - - N/A- reports no falls - -  Risk for fall due to : - Other (Comment) - - -  Risk for fall due to: Comment - wound vac placement - - -  Follow up - Falls prevention discussed - - -   Depression Screen PHQ 2/9 Scores 12/07/2018 07/14/2018 04/19/2018 12/01/2017  PHQ - 2 Score 0 0 0 0     Cognitive Function MMSE - Mini Mental State Exam 12/01/2017  Orientation to time 5  Orientation to Place 5  Registration 3  Attention/ Calculation 5  Recall 3  Language- name 2 objects 2  Language- repeat 1  Language- follow 3 step command 3  Language- read & follow direction  1  Write a sentence 1  Copy design 1  Total score 30     6CIT Screen 12/07/2018  What Year? 0 points  What month? 0 points  What time? 0 points  Count back from 20 0 points  Months in reverse 0 points  Repeat phrase 0 points  Total Score 0    Immunization History  Administered Date(s) Administered  . Influenza, High Dose Seasonal PF 08/06/2017  . Influenza-Unspecified 05/26/2012, 04/20/2013  . Pneumococcal Polysaccharide-23 09/07/2018   Screening Tests Health  Maintenance  Topic Date Due  . TETANUS/TDAP  03/15/1962  . HEMOGLOBIN A1C  12/04/2018  . INFLUENZA VACCINE  03/05/2019  . PNA vac Low Risk Adult (2 of 2 - PCV13) 09/08/2019  . COLONOSCOPY  01/28/2024  . DEXA SCAN  Completed  . FOOT EXAM  Discontinued  . OPHTHALMOLOGY EXAM  Discontinued      Plan:    End of life planning; Advance aging; Advanced directives discussed.  Copy of current HCPOA/Living Will requested.    I have personally reviewed and noted the following in the patient's chart:   . Medical and social history . Use of alcohol, tobacco or illicit drugs  . Current medications and supplements . Functional ability and status . Nutritional status . Physical activity . Advanced directives . List of other physicians . Hospitalizations, surgeries, and ER visits in previous 12 months . Vitals . Screenings to include cognitive, depression, and falls . Referrals and appointments  In addition, I have reviewed and discussed with patient certain preventive protocols, quality metrics, and best practice recommendations. A written personalized care plan for preventive services as well as general preventive health recommendations were provided to patient.     Varney Biles, LPN  01/09/7372

## 2018-12-07 NOTE — Patient Instructions (Addendum)
  Taylor Tyler , Thank you for taking time to come for your Medicare Wellness Visit. I appreciate your ongoing commitment to your health goals. Please review the following plan we discussed and let me know if I can assist you in the future.   These are the goals we discussed: Goals      Patient Stated   . Maintain Healthy Lifestyle (pt-stated)     Monitor diet Watch weight gain Drink plenty of fluids       This is a list of the screening recommended for you and due dates:  Health Maintenance  Topic Date Due  . Tetanus Vaccine  03/15/1962  . Hemoglobin A1C  12/04/2018  . Flu Shot  03/05/2019  . Pneumonia vaccines (2 of 2 - PCV13) 09/08/2019  . Colon Cancer Screening  01/28/2024  . DEXA scan (bone density measurement)  Completed  . Complete foot exam   Discontinued  . Eye exam for diabetics  Discontinued

## 2018-12-07 NOTE — Telephone Encounter (Signed)
COVID-19 Pre-Screening Questions:  Do you currently have a fever (>100 F), chills or unexplained body aches? No   Are you currently experiencing new cough, shortness of breath, sore throat, runny nose? *no    Have you recently travelled outside the state of [place] in the last 14 days? No    Have you been in contact with someone that is currently pending confirmation of Covid19 testing or has been confirmed to have the [place] virus?  No

## 2018-12-07 NOTE — Progress Notes (Signed)
Agree with below signed   [place]

## 2018-12-08 ENCOUNTER — Ambulatory Visit: Payer: Medicare Other | Admitting: Infectious Diseases

## 2018-12-08 ENCOUNTER — Other Ambulatory Visit: Payer: Self-pay

## 2018-12-08 VITALS — BP 176/72 | HR 73 | Temp 97.9°F | Wt 200.0 lb

## 2018-12-08 DIAGNOSIS — N1831 Chronic kidney disease, stage 3a: Secondary | ICD-10-CM

## 2018-12-08 DIAGNOSIS — N183 Chronic kidney disease, stage 3 (moderate): Secondary | ICD-10-CM

## 2018-12-08 DIAGNOSIS — E1169 Type 2 diabetes mellitus with other specified complication: Secondary | ICD-10-CM

## 2018-12-08 DIAGNOSIS — G061 Intraspinal abscess and granuloma: Secondary | ICD-10-CM

## 2018-12-08 NOTE — Patient Instructions (Signed)
Return to surgeon for I&D of spinal wound vs. Updated sterile wound cx (cannot treat based on cx that is 54 months old). Wound tunnels to bone with in office probe.

## 2018-12-08 NOTE — Progress Notes (Signed)
Subjective:    [name], female    DOB: Dec 04, 1940, 78 y.o.   MRN: [id number]  HPI patient is a pleasant 78 year old African-American female with stage III chronic kidney disease, obesity, and diabetes mellitus for reevaluation of a recurrent lumbar wound infection.  Patient has a complex surgical history that involved a lumbar fusion at L3-L5 with a decompressive laminectomy in 2014.  She did relatively well until July 2018 when she underwent a revision of her initial fusion.  By August of that year, she had developed a incisional lumbar wound that required debridement and placement of a wound VAC operative cultures at that time grew Proteus which was treated with IV Rocephin and then transitioned to complete treatment with oral Levaquin for 6 weeks.  She was followed by Dr. Megan Salon at that time.  By November 2018, she was determined "cured" from her infection and she had a wound VAC to remain intact.  Her wound VAC was removed in February 2019 but by November 2019 she re-presented to [facility] for another episode of lumbar infection again with Proteus.  Again her wound was debrided, but her infection was felt to be superficial on this occasion and she was ultimately transitioned to oral amoxicillin 3 times daily and completed approximate 3-week antibiotic treatment course in total ending June 23, 2018.  It does not appear that she followed up in our clinic after discharge from that admission.  Recently, her wound drainage increased prompting her surgeons to send the patient back to our office for further evaluation.  She has not had any updated wound cultures obtained nor has she been on any antibiotics in recent weeks.  Past Medical History:  Diagnosis Date  . Acute blood loss anemia 03/14/2017  . Acute renal failure superimposed on stage 3 chronic kidney disease ([place]) 03/14/2017  . AKI (acute kidney injury) ([place]) 02/28/2017  . Altered mental status 03/29/2017  .  Anemia   . Aortic atherosclerosis ([place])   . Arthritis    "joints might ache at times; not that bad" (06/10/2018)  . Bilateral lower extremity edema 03/04/2017  . Bradycardia   . Chronic kidney disease    ?? renal insufficiency,   . CKD (chronic kidney disease) stage 3, GFR 30-59 ml/min (HCC) 01/15/2017   ?? renal insufficiency, which she thinks is coming from "all these medications"  . Diabetes mellitus without complication ([place])    diagnosed 4-5 yrs ago, 06/21/18- "that was years ago"  . Disease of pancreas   . Diverticulitis    s/p perforation and partial colectomy 01/27/14 with 3 benign lymph nodes   . Diverticulosis 01/15/2017  . DJD (degenerative joint disease)   . DM (diabetes mellitus), type 2 with renal complications ([place]) 01/31/4764  . Fatty liver   . Hypertension   . Hypothyroidism    "had radiation" (06/10/2018)  . Kidney stone   . Lethargy 02/28/2017  . Obesity, Class III, BMI 40-49.9 (morbid obesity) ([place]) 02/27/2017  . Pleural lipoma   . Postoperative wound infection 04/02/2017  . Spinal stenosis of lumbar region 01/15/2017  . Status post lumbar surgery   . Wound healing, delayed    back   Family History  Problem Relation Age of Onset  . Diabetes Father   . Heart attack Father   . Hypertension Father   . Heart disease Father   . Kidney disease Father   . Diabetes Mother   . Hypertension Mother   . Heart disease Mother   .  Kidney disease Mother   . Lupus Sister   . Heart disease Brother   . Diabetes Brother   . Heart disease Brother   . Diabetes Brother   . Lupus Sister    Social History   Tobacco Use  . Smoking status: Never Smoker  . Smokeless tobacco: Never Used  Substance Use Topics  . Alcohol use: Not Currently    Frequency: Never    Comment: "nothing since age 40" (06/10/2018)  . Drug use: Never    Review of Systems  Constitutional: Positive for fatigue. Negative for chills and fever.  HENT: Negative for congestion, hearing loss and sinus  pressure.   Eyes: Negative for photophobia, discharge, redness and visual disturbance.  Respiratory: Negative for apnea, cough, shortness of breath and wheezing.   Cardiovascular: Negative for chest pain and leg swelling.  Gastrointestinal: Negative for abdominal distention, abdominal pain, constipation, diarrhea, nausea and vomiting.  Endocrine: Negative for cold intolerance, heat intolerance, polydipsia and polyuria.  Genitourinary: Negative for dysuria, flank pain, frequency, urgency, vaginal bleeding and vaginal discharge.  Musculoskeletal: Positive for back pain. Negative for arthralgias, joint swelling and neck pain.  Skin: Positive for wound. Negative for pallor and rash.       lumbar  Allergic/Immunologic: Negative for immunocompromised state.  Neurological: Negative for dizziness, seizures, speech difficulty, weakness and headaches.  Hematological: Does not bruise/bleed easily.  Psychiatric/Behavioral: Negative for agitation, confusion, hallucinations and sleep disturbance. The patient is nervous/anxious.        Vitals:   12/08/18 1027  BP: (!) 176/72  Pulse: 73  Temp: 97.9 F (36.6 C)   Objective:   Physical Exam Physical Exam Gen: pleasant, anxious, mild distress secondary to lumbar pain/wound, A&Ox 3 Head: NCAT, no temporal wasting evident EENT: PERRL, EOMI, MMM, adequate dentition Neck: supple, no JVD CV: NRRR, no murmurs evident Pulm: CTA bilaterally, no wheeze or retractions Abd: soft, obese, NTND, +BS MSK: well-demarcated open lumbar wound with heavy seropurulent drainage noted when packing was removed, no hardware or bone exposed at base of wound Extrems: 1+ non-pitting LE edema, 2+ pulses Skin: no rashes, adequate skin turgor Neuro: CN II-XII grossly intact, no focal neurologic deficits appreciated, gait was slowed and wide stanced, A&Ox 3     Assessment & Plan:  The patient is a 78 year old African-American obese female diabetic with stage III chronic  kidney disease and recurrent lumbar wound infection worrisome for vertebral osteomyelitis/prosthetic joint infection.  Lumbar wound infection/abscess -despite the patient's 2 recent episodes of infection to her lumbar spine and Proteus, there is no assurance that the same pathogen is the causative agent at this time.  She has not received any oral antibiotics prior to this visit she has an upcoming visit with her neurosurgeon.  At this time, I would advise a formal I&D to the patient's surgical wound and obtaining cultures and initiating antibiotics based on updated culture data.  If the patient is admitted to accomplish these tasks, please consult our infectious these services we will be happy to follow along with you.  I am concerned about adding oral antibiotics and appropriately back to the patient empirically without culture guidance given that she will have an increased risk to infect her bone and her hardware.  If indeed the patient has progressed to infection of her spinal hardware, not only which she need to be considered for potential removal of sections of her fusion apparatus, also she would require typically 8 weeks of parenteral antibiotic therapy to be followed by months  of oral consolidative therapy thereafter.  The only benefit she currently has to have an open wound is that drainage will evacuate through this opening rather than hopefully burrowing into deeper towards her spine and hardware.  Stage III CRI - Given her renal insufficiency, she is at higher risk for renal complications for prolonged antibiotic therapy, so empiric antibiotic therapy is not advised as this would deprive [place] of knowing what are other antibiotic options might be should she develop further problems.  Check the patient's BMP today in preparation/anticipation that she will likely require antibiotics in the near future.  Diabetes mellitus- patient was instructed to aim for a goal a.m. blood sugar of less than 150 and if  possible maintain her blood sugars below this level at all times to optimize her infectious outcome.  Will defer hypoglycemic medications to her primary physician.  Currently, it appears as though the patient is diet controlled.

## 2018-12-09 DIAGNOSIS — D631 Anemia in chronic kidney disease: Secondary | ICD-10-CM | POA: Diagnosis not present

## 2018-12-09 DIAGNOSIS — Z792 Long term (current) use of antibiotics: Secondary | ICD-10-CM | POA: Diagnosis not present

## 2018-12-09 DIAGNOSIS — E039 Hypothyroidism, unspecified: Secondary | ICD-10-CM | POA: Diagnosis not present

## 2018-12-09 DIAGNOSIS — Z9181 History of falling: Secondary | ICD-10-CM | POA: Diagnosis not present

## 2018-12-09 DIAGNOSIS — K5730 Diverticulosis of large intestine without perforation or abscess without bleeding: Secondary | ICD-10-CM | POA: Diagnosis not present

## 2018-12-09 DIAGNOSIS — I129 Hypertensive chronic kidney disease with stage 1 through stage 4 chronic kidney disease, or unspecified chronic kidney disease: Secondary | ICD-10-CM | POA: Diagnosis not present

## 2018-12-09 DIAGNOSIS — E1122 Type 2 diabetes mellitus with diabetic chronic kidney disease: Secondary | ICD-10-CM | POA: Diagnosis not present

## 2018-12-09 DIAGNOSIS — Z981 Arthrodesis status: Secondary | ICD-10-CM | POA: Diagnosis not present

## 2018-12-09 DIAGNOSIS — L02212 Cutaneous abscess of back [any part, except buttock]: Secondary | ICD-10-CM | POA: Diagnosis not present

## 2018-12-09 DIAGNOSIS — M48061 Spinal stenosis, lumbar region without neurogenic claudication: Secondary | ICD-10-CM | POA: Diagnosis not present

## 2018-12-09 DIAGNOSIS — N183 Chronic kidney disease, stage 3 (moderate): Secondary | ICD-10-CM | POA: Diagnosis not present

## 2018-12-09 DIAGNOSIS — T8141XA Infection following a procedure, superficial incisional surgical site, initial encounter: Secondary | ICD-10-CM | POA: Diagnosis not present

## 2018-12-09 DIAGNOSIS — I2510 Atherosclerotic heart disease of native coronary artery without angina pectoris: Secondary | ICD-10-CM | POA: Diagnosis not present

## 2018-12-09 DIAGNOSIS — E785 Hyperlipidemia, unspecified: Secondary | ICD-10-CM | POA: Diagnosis not present

## 2018-12-09 DIAGNOSIS — M1990 Unspecified osteoarthritis, unspecified site: Secondary | ICD-10-CM | POA: Diagnosis not present

## 2018-12-09 LAB — CBC WITH DIFFERENTIAL/PLATELET
Absolute Monocytes: 792 cells/uL (ref 200–950)
Basophils Absolute: 22 cells/uL (ref 0–200)
Basophils Relative: 0.3 %
Eosinophils Absolute: 72 cells/uL (ref 15–500)
Eosinophils Relative: 1 %
HCT: 30.4 % — ABNORMAL LOW (ref 35.0–45.0)
Hemoglobin: 9.9 g/dL — ABNORMAL LOW (ref 11.7–15.5)
Lymphs Abs: 1462 cells/uL (ref 850–3900)
MCH: 27.5 pg (ref 27.0–33.0)
MCHC: 32.6 g/dL (ref 32.0–36.0)
MCV: 84.4 fL (ref 80.0–100.0)
MPV: 9.2 fL (ref 7.5–12.5)
Monocytes Relative: 11 %
Neutro Abs: 4853 cells/uL (ref 1500–7800)
Neutrophils Relative %: 67.4 %
Platelets: 369 10*3/uL (ref 140–400)
RBC: 3.6 10*6/uL — ABNORMAL LOW (ref 3.80–5.10)
RDW: 13.7 % (ref 11.0–15.0)
Total Lymphocyte: 20.3 %
WBC: 7.2 10*3/uL (ref 3.8–10.8)

## 2018-12-09 LAB — BASIC METABOLIC PANEL
BUN/Creatinine Ratio: 15 (calc) (ref 6–22)
BUN: 34 mg/dL — ABNORMAL HIGH (ref 7–25)
CO2: 24 mmol/L (ref 20–32)
Calcium: 9 mg/dL (ref 8.6–10.4)
Chloride: 107 mmol/L (ref 98–110)
Creat: 2.24 mg/dL — ABNORMAL HIGH (ref 0.60–0.93)
Glucose, Bld: 83 mg/dL (ref 65–99)
Potassium: 4.5 mmol/L (ref 3.5–5.3)
Sodium: 137 mmol/L (ref 135–146)

## 2018-12-09 LAB — C-REACTIVE PROTEIN: CRP: 4.9 mg/L (ref <8.0)

## 2018-12-14 DIAGNOSIS — I2510 Atherosclerotic heart disease of native coronary artery without angina pectoris: Secondary | ICD-10-CM | POA: Diagnosis not present

## 2018-12-14 DIAGNOSIS — M48061 Spinal stenosis, lumbar region without neurogenic claudication: Secondary | ICD-10-CM | POA: Diagnosis not present

## 2018-12-14 DIAGNOSIS — D631 Anemia in chronic kidney disease: Secondary | ICD-10-CM | POA: Diagnosis not present

## 2018-12-14 DIAGNOSIS — Z792 Long term (current) use of antibiotics: Secondary | ICD-10-CM | POA: Diagnosis not present

## 2018-12-14 DIAGNOSIS — Z981 Arthrodesis status: Secondary | ICD-10-CM | POA: Diagnosis not present

## 2018-12-14 DIAGNOSIS — Z9181 History of falling: Secondary | ICD-10-CM | POA: Diagnosis not present

## 2018-12-14 DIAGNOSIS — E039 Hypothyroidism, unspecified: Secondary | ICD-10-CM | POA: Diagnosis not present

## 2018-12-14 DIAGNOSIS — E1122 Type 2 diabetes mellitus with diabetic chronic kidney disease: Secondary | ICD-10-CM | POA: Diagnosis not present

## 2018-12-14 DIAGNOSIS — T8141XA Infection following a procedure, superficial incisional surgical site, initial encounter: Secondary | ICD-10-CM | POA: Diagnosis not present

## 2018-12-14 DIAGNOSIS — K5730 Diverticulosis of large intestine without perforation or abscess without bleeding: Secondary | ICD-10-CM | POA: Diagnosis not present

## 2018-12-14 DIAGNOSIS — N183 Chronic kidney disease, stage 3 (moderate): Secondary | ICD-10-CM | POA: Diagnosis not present

## 2018-12-14 DIAGNOSIS — I129 Hypertensive chronic kidney disease with stage 1 through stage 4 chronic kidney disease, or unspecified chronic kidney disease: Secondary | ICD-10-CM | POA: Diagnosis not present

## 2018-12-14 DIAGNOSIS — M1990 Unspecified osteoarthritis, unspecified site: Secondary | ICD-10-CM | POA: Diagnosis not present

## 2018-12-14 DIAGNOSIS — E785 Hyperlipidemia, unspecified: Secondary | ICD-10-CM | POA: Diagnosis not present

## 2018-12-14 DIAGNOSIS — L02212 Cutaneous abscess of back [any part, except buttock]: Secondary | ICD-10-CM | POA: Diagnosis not present

## 2018-12-15 DIAGNOSIS — T8149XA Infection following a procedure, other surgical site, initial encounter: Secondary | ICD-10-CM | POA: Diagnosis not present

## 2018-12-15 DIAGNOSIS — I10 Essential (primary) hypertension: Secondary | ICD-10-CM | POA: Diagnosis not present

## 2018-12-17 DIAGNOSIS — M48061 Spinal stenosis, lumbar region without neurogenic claudication: Secondary | ICD-10-CM | POA: Diagnosis not present

## 2018-12-17 DIAGNOSIS — N183 Chronic kidney disease, stage 3 (moderate): Secondary | ICD-10-CM | POA: Diagnosis not present

## 2018-12-17 DIAGNOSIS — Z792 Long term (current) use of antibiotics: Secondary | ICD-10-CM | POA: Diagnosis not present

## 2018-12-17 DIAGNOSIS — I2510 Atherosclerotic heart disease of native coronary artery without angina pectoris: Secondary | ICD-10-CM | POA: Diagnosis not present

## 2018-12-17 DIAGNOSIS — M1990 Unspecified osteoarthritis, unspecified site: Secondary | ICD-10-CM | POA: Diagnosis not present

## 2018-12-17 DIAGNOSIS — E785 Hyperlipidemia, unspecified: Secondary | ICD-10-CM | POA: Diagnosis not present

## 2018-12-17 DIAGNOSIS — Z9181 History of falling: Secondary | ICD-10-CM | POA: Diagnosis not present

## 2018-12-17 DIAGNOSIS — K5730 Diverticulosis of large intestine without perforation or abscess without bleeding: Secondary | ICD-10-CM | POA: Diagnosis not present

## 2018-12-17 DIAGNOSIS — D631 Anemia in chronic kidney disease: Secondary | ICD-10-CM | POA: Diagnosis not present

## 2018-12-17 DIAGNOSIS — L02212 Cutaneous abscess of back [any part, except buttock]: Secondary | ICD-10-CM | POA: Diagnosis not present

## 2018-12-17 DIAGNOSIS — E1122 Type 2 diabetes mellitus with diabetic chronic kidney disease: Secondary | ICD-10-CM | POA: Diagnosis not present

## 2018-12-17 DIAGNOSIS — I129 Hypertensive chronic kidney disease with stage 1 through stage 4 chronic kidney disease, or unspecified chronic kidney disease: Secondary | ICD-10-CM | POA: Diagnosis not present

## 2018-12-17 DIAGNOSIS — E039 Hypothyroidism, unspecified: Secondary | ICD-10-CM | POA: Diagnosis not present

## 2018-12-17 DIAGNOSIS — Z981 Arthrodesis status: Secondary | ICD-10-CM | POA: Diagnosis not present

## 2018-12-17 DIAGNOSIS — T8141XA Infection following a procedure, superficial incisional surgical site, initial encounter: Secondary | ICD-10-CM | POA: Diagnosis not present

## 2018-12-20 DIAGNOSIS — I129 Hypertensive chronic kidney disease with stage 1 through stage 4 chronic kidney disease, or unspecified chronic kidney disease: Secondary | ICD-10-CM | POA: Diagnosis not present

## 2018-12-20 DIAGNOSIS — N2581 Secondary hyperparathyroidism of renal origin: Secondary | ICD-10-CM | POA: Diagnosis not present

## 2018-12-20 DIAGNOSIS — N184 Chronic kidney disease, stage 4 (severe): Secondary | ICD-10-CM | POA: Diagnosis not present

## 2018-12-20 DIAGNOSIS — R809 Proteinuria, unspecified: Secondary | ICD-10-CM | POA: Diagnosis not present

## 2018-12-20 DIAGNOSIS — D649 Anemia, unspecified: Secondary | ICD-10-CM | POA: Diagnosis not present

## 2018-12-21 DIAGNOSIS — E1122 Type 2 diabetes mellitus with diabetic chronic kidney disease: Secondary | ICD-10-CM | POA: Diagnosis not present

## 2018-12-21 DIAGNOSIS — N183 Chronic kidney disease, stage 3 (moderate): Secondary | ICD-10-CM | POA: Diagnosis not present

## 2018-12-21 DIAGNOSIS — M48061 Spinal stenosis, lumbar region without neurogenic claudication: Secondary | ICD-10-CM | POA: Diagnosis not present

## 2018-12-21 DIAGNOSIS — K5730 Diverticulosis of large intestine without perforation or abscess without bleeding: Secondary | ICD-10-CM | POA: Diagnosis not present

## 2018-12-21 DIAGNOSIS — D631 Anemia in chronic kidney disease: Secondary | ICD-10-CM | POA: Diagnosis not present

## 2018-12-21 DIAGNOSIS — E039 Hypothyroidism, unspecified: Secondary | ICD-10-CM | POA: Diagnosis not present

## 2018-12-21 DIAGNOSIS — I129 Hypertensive chronic kidney disease with stage 1 through stage 4 chronic kidney disease, or unspecified chronic kidney disease: Secondary | ICD-10-CM | POA: Diagnosis not present

## 2018-12-21 DIAGNOSIS — M1990 Unspecified osteoarthritis, unspecified site: Secondary | ICD-10-CM | POA: Diagnosis not present

## 2018-12-21 DIAGNOSIS — L02212 Cutaneous abscess of back [any part, except buttock]: Secondary | ICD-10-CM | POA: Diagnosis not present

## 2018-12-21 DIAGNOSIS — T8141XA Infection following a procedure, superficial incisional surgical site, initial encounter: Secondary | ICD-10-CM | POA: Diagnosis not present

## 2018-12-21 DIAGNOSIS — Z9181 History of falling: Secondary | ICD-10-CM | POA: Diagnosis not present

## 2018-12-21 DIAGNOSIS — I2510 Atherosclerotic heart disease of native coronary artery without angina pectoris: Secondary | ICD-10-CM | POA: Diagnosis not present

## 2018-12-21 DIAGNOSIS — Z981 Arthrodesis status: Secondary | ICD-10-CM | POA: Diagnosis not present

## 2018-12-21 DIAGNOSIS — Z792 Long term (current) use of antibiotics: Secondary | ICD-10-CM | POA: Diagnosis not present

## 2018-12-21 DIAGNOSIS — E785 Hyperlipidemia, unspecified: Secondary | ICD-10-CM | POA: Diagnosis not present

## 2018-12-24 DIAGNOSIS — D631 Anemia in chronic kidney disease: Secondary | ICD-10-CM | POA: Diagnosis not present

## 2018-12-24 DIAGNOSIS — E039 Hypothyroidism, unspecified: Secondary | ICD-10-CM | POA: Diagnosis not present

## 2018-12-24 DIAGNOSIS — I129 Hypertensive chronic kidney disease with stage 1 through stage 4 chronic kidney disease, or unspecified chronic kidney disease: Secondary | ICD-10-CM | POA: Diagnosis not present

## 2018-12-24 DIAGNOSIS — M1990 Unspecified osteoarthritis, unspecified site: Secondary | ICD-10-CM | POA: Diagnosis not present

## 2018-12-24 DIAGNOSIS — E1122 Type 2 diabetes mellitus with diabetic chronic kidney disease: Secondary | ICD-10-CM | POA: Diagnosis not present

## 2018-12-24 DIAGNOSIS — Z9181 History of falling: Secondary | ICD-10-CM | POA: Diagnosis not present

## 2018-12-24 DIAGNOSIS — L02212 Cutaneous abscess of back [any part, except buttock]: Secondary | ICD-10-CM | POA: Diagnosis not present

## 2018-12-24 DIAGNOSIS — Z981 Arthrodesis status: Secondary | ICD-10-CM | POA: Diagnosis not present

## 2018-12-24 DIAGNOSIS — K5730 Diverticulosis of large intestine without perforation or abscess without bleeding: Secondary | ICD-10-CM | POA: Diagnosis not present

## 2018-12-24 DIAGNOSIS — T8141XA Infection following a procedure, superficial incisional surgical site, initial encounter: Secondary | ICD-10-CM | POA: Diagnosis not present

## 2018-12-24 DIAGNOSIS — E785 Hyperlipidemia, unspecified: Secondary | ICD-10-CM | POA: Diagnosis not present

## 2018-12-24 DIAGNOSIS — Z792 Long term (current) use of antibiotics: Secondary | ICD-10-CM | POA: Diagnosis not present

## 2018-12-24 DIAGNOSIS — I2510 Atherosclerotic heart disease of native coronary artery without angina pectoris: Secondary | ICD-10-CM | POA: Diagnosis not present

## 2018-12-24 DIAGNOSIS — N183 Chronic kidney disease, stage 3 (moderate): Secondary | ICD-10-CM | POA: Diagnosis not present

## 2018-12-24 DIAGNOSIS — M48061 Spinal stenosis, lumbar region without neurogenic claudication: Secondary | ICD-10-CM | POA: Diagnosis not present

## 2018-12-28 DIAGNOSIS — E785 Hyperlipidemia, unspecified: Secondary | ICD-10-CM | POA: Diagnosis not present

## 2018-12-28 DIAGNOSIS — D631 Anemia in chronic kidney disease: Secondary | ICD-10-CM | POA: Diagnosis not present

## 2018-12-28 DIAGNOSIS — I2510 Atherosclerotic heart disease of native coronary artery without angina pectoris: Secondary | ICD-10-CM | POA: Diagnosis not present

## 2018-12-28 DIAGNOSIS — I129 Hypertensive chronic kidney disease with stage 1 through stage 4 chronic kidney disease, or unspecified chronic kidney disease: Secondary | ICD-10-CM | POA: Diagnosis not present

## 2018-12-28 DIAGNOSIS — Z9181 History of falling: Secondary | ICD-10-CM | POA: Diagnosis not present

## 2018-12-28 DIAGNOSIS — K5730 Diverticulosis of large intestine without perforation or abscess without bleeding: Secondary | ICD-10-CM | POA: Diagnosis not present

## 2018-12-28 DIAGNOSIS — N183 Chronic kidney disease, stage 3 (moderate): Secondary | ICD-10-CM | POA: Diagnosis not present

## 2018-12-28 DIAGNOSIS — M1990 Unspecified osteoarthritis, unspecified site: Secondary | ICD-10-CM | POA: Diagnosis not present

## 2018-12-28 DIAGNOSIS — Z981 Arthrodesis status: Secondary | ICD-10-CM | POA: Diagnosis not present

## 2018-12-28 DIAGNOSIS — E039 Hypothyroidism, unspecified: Secondary | ICD-10-CM | POA: Diagnosis not present

## 2018-12-28 DIAGNOSIS — Z792 Long term (current) use of antibiotics: Secondary | ICD-10-CM | POA: Diagnosis not present

## 2018-12-28 DIAGNOSIS — E1122 Type 2 diabetes mellitus with diabetic chronic kidney disease: Secondary | ICD-10-CM | POA: Diagnosis not present

## 2018-12-28 DIAGNOSIS — L02212 Cutaneous abscess of back [any part, except buttock]: Secondary | ICD-10-CM | POA: Diagnosis not present

## 2018-12-28 DIAGNOSIS — T8141XA Infection following a procedure, superficial incisional surgical site, initial encounter: Secondary | ICD-10-CM | POA: Diagnosis not present

## 2018-12-28 DIAGNOSIS — M48061 Spinal stenosis, lumbar region without neurogenic claudication: Secondary | ICD-10-CM | POA: Diagnosis not present

## 2018-12-31 DIAGNOSIS — Z792 Long term (current) use of antibiotics: Secondary | ICD-10-CM | POA: Diagnosis not present

## 2018-12-31 DIAGNOSIS — D631 Anemia in chronic kidney disease: Secondary | ICD-10-CM | POA: Diagnosis not present

## 2018-12-31 DIAGNOSIS — Z981 Arthrodesis status: Secondary | ICD-10-CM | POA: Diagnosis not present

## 2018-12-31 DIAGNOSIS — L02212 Cutaneous abscess of back [any part, except buttock]: Secondary | ICD-10-CM | POA: Diagnosis not present

## 2018-12-31 DIAGNOSIS — N183 Chronic kidney disease, stage 3 (moderate): Secondary | ICD-10-CM | POA: Diagnosis not present

## 2018-12-31 DIAGNOSIS — E785 Hyperlipidemia, unspecified: Secondary | ICD-10-CM | POA: Diagnosis not present

## 2018-12-31 DIAGNOSIS — I2510 Atherosclerotic heart disease of native coronary artery without angina pectoris: Secondary | ICD-10-CM | POA: Diagnosis not present

## 2018-12-31 DIAGNOSIS — Z9181 History of falling: Secondary | ICD-10-CM | POA: Diagnosis not present

## 2018-12-31 DIAGNOSIS — E1122 Type 2 diabetes mellitus with diabetic chronic kidney disease: Secondary | ICD-10-CM | POA: Diagnosis not present

## 2018-12-31 DIAGNOSIS — I129 Hypertensive chronic kidney disease with stage 1 through stage 4 chronic kidney disease, or unspecified chronic kidney disease: Secondary | ICD-10-CM | POA: Diagnosis not present

## 2018-12-31 DIAGNOSIS — K5730 Diverticulosis of large intestine without perforation or abscess without bleeding: Secondary | ICD-10-CM | POA: Diagnosis not present

## 2018-12-31 DIAGNOSIS — M1990 Unspecified osteoarthritis, unspecified site: Secondary | ICD-10-CM | POA: Diagnosis not present

## 2018-12-31 DIAGNOSIS — T8141XA Infection following a procedure, superficial incisional surgical site, initial encounter: Secondary | ICD-10-CM | POA: Diagnosis not present

## 2018-12-31 DIAGNOSIS — M48061 Spinal stenosis, lumbar region without neurogenic claudication: Secondary | ICD-10-CM | POA: Diagnosis not present

## 2018-12-31 DIAGNOSIS — E039 Hypothyroidism, unspecified: Secondary | ICD-10-CM | POA: Diagnosis not present

## 2019-01-03 DIAGNOSIS — I2510 Atherosclerotic heart disease of native coronary artery without angina pectoris: Secondary | ICD-10-CM | POA: Diagnosis not present

## 2019-01-03 DIAGNOSIS — L02212 Cutaneous abscess of back [any part, except buttock]: Secondary | ICD-10-CM | POA: Diagnosis not present

## 2019-01-03 DIAGNOSIS — I129 Hypertensive chronic kidney disease with stage 1 through stage 4 chronic kidney disease, or unspecified chronic kidney disease: Secondary | ICD-10-CM | POA: Diagnosis not present

## 2019-01-03 DIAGNOSIS — N183 Chronic kidney disease, stage 3 (moderate): Secondary | ICD-10-CM | POA: Diagnosis not present

## 2019-01-03 DIAGNOSIS — M48061 Spinal stenosis, lumbar region without neurogenic claudication: Secondary | ICD-10-CM | POA: Diagnosis not present

## 2019-01-03 DIAGNOSIS — K5730 Diverticulosis of large intestine without perforation or abscess without bleeding: Secondary | ICD-10-CM | POA: Diagnosis not present

## 2019-01-03 DIAGNOSIS — Z981 Arthrodesis status: Secondary | ICD-10-CM | POA: Diagnosis not present

## 2019-01-03 DIAGNOSIS — D631 Anemia in chronic kidney disease: Secondary | ICD-10-CM | POA: Diagnosis not present

## 2019-01-03 DIAGNOSIS — Z792 Long term (current) use of antibiotics: Secondary | ICD-10-CM | POA: Diagnosis not present

## 2019-01-03 DIAGNOSIS — T8141XA Infection following a procedure, superficial incisional surgical site, initial encounter: Secondary | ICD-10-CM | POA: Diagnosis not present

## 2019-01-03 DIAGNOSIS — Z9181 History of falling: Secondary | ICD-10-CM | POA: Diagnosis not present

## 2019-01-03 DIAGNOSIS — E785 Hyperlipidemia, unspecified: Secondary | ICD-10-CM | POA: Diagnosis not present

## 2019-01-03 DIAGNOSIS — E039 Hypothyroidism, unspecified: Secondary | ICD-10-CM | POA: Diagnosis not present

## 2019-01-03 DIAGNOSIS — M1990 Unspecified osteoarthritis, unspecified site: Secondary | ICD-10-CM | POA: Diagnosis not present

## 2019-01-03 DIAGNOSIS — E1122 Type 2 diabetes mellitus with diabetic chronic kidney disease: Secondary | ICD-10-CM | POA: Diagnosis not present

## 2019-01-04 ENCOUNTER — Ambulatory Visit: Payer: Self-pay | Admitting: General Surgery

## 2019-01-07 DIAGNOSIS — M48061 Spinal stenosis, lumbar region without neurogenic claudication: Secondary | ICD-10-CM | POA: Diagnosis not present

## 2019-01-07 DIAGNOSIS — D631 Anemia in chronic kidney disease: Secondary | ICD-10-CM | POA: Diagnosis not present

## 2019-01-07 DIAGNOSIS — I129 Hypertensive chronic kidney disease with stage 1 through stage 4 chronic kidney disease, or unspecified chronic kidney disease: Secondary | ICD-10-CM | POA: Diagnosis not present

## 2019-01-07 DIAGNOSIS — E039 Hypothyroidism, unspecified: Secondary | ICD-10-CM | POA: Diagnosis not present

## 2019-01-07 DIAGNOSIS — T8141XA Infection following a procedure, superficial incisional surgical site, initial encounter: Secondary | ICD-10-CM | POA: Diagnosis not present

## 2019-01-07 DIAGNOSIS — E1122 Type 2 diabetes mellitus with diabetic chronic kidney disease: Secondary | ICD-10-CM | POA: Diagnosis not present

## 2019-01-07 DIAGNOSIS — N183 Chronic kidney disease, stage 3 (moderate): Secondary | ICD-10-CM | POA: Diagnosis not present

## 2019-01-07 DIAGNOSIS — Z792 Long term (current) use of antibiotics: Secondary | ICD-10-CM | POA: Diagnosis not present

## 2019-01-07 DIAGNOSIS — L02212 Cutaneous abscess of back [any part, except buttock]: Secondary | ICD-10-CM | POA: Diagnosis not present

## 2019-01-07 DIAGNOSIS — E785 Hyperlipidemia, unspecified: Secondary | ICD-10-CM | POA: Diagnosis not present

## 2019-01-07 DIAGNOSIS — Z9181 History of falling: Secondary | ICD-10-CM | POA: Diagnosis not present

## 2019-01-07 DIAGNOSIS — Z981 Arthrodesis status: Secondary | ICD-10-CM | POA: Diagnosis not present

## 2019-01-07 DIAGNOSIS — M1990 Unspecified osteoarthritis, unspecified site: Secondary | ICD-10-CM | POA: Diagnosis not present

## 2019-01-07 DIAGNOSIS — I2510 Atherosclerotic heart disease of native coronary artery without angina pectoris: Secondary | ICD-10-CM | POA: Diagnosis not present

## 2019-01-07 DIAGNOSIS — K5730 Diverticulosis of large intestine without perforation or abscess without bleeding: Secondary | ICD-10-CM | POA: Diagnosis not present

## 2019-01-10 DIAGNOSIS — M48061 Spinal stenosis, lumbar region without neurogenic claudication: Secondary | ICD-10-CM | POA: Diagnosis not present

## 2019-01-10 DIAGNOSIS — K5730 Diverticulosis of large intestine without perforation or abscess without bleeding: Secondary | ICD-10-CM | POA: Diagnosis not present

## 2019-01-10 DIAGNOSIS — T8141XA Infection following a procedure, superficial incisional surgical site, initial encounter: Secondary | ICD-10-CM | POA: Diagnosis not present

## 2019-01-10 DIAGNOSIS — E1122 Type 2 diabetes mellitus with diabetic chronic kidney disease: Secondary | ICD-10-CM | POA: Diagnosis not present

## 2019-01-10 DIAGNOSIS — L02212 Cutaneous abscess of back [any part, except buttock]: Secondary | ICD-10-CM | POA: Diagnosis not present

## 2019-01-10 DIAGNOSIS — Z792 Long term (current) use of antibiotics: Secondary | ICD-10-CM | POA: Diagnosis not present

## 2019-01-10 DIAGNOSIS — E785 Hyperlipidemia, unspecified: Secondary | ICD-10-CM | POA: Diagnosis not present

## 2019-01-10 DIAGNOSIS — M1990 Unspecified osteoarthritis, unspecified site: Secondary | ICD-10-CM | POA: Diagnosis not present

## 2019-01-10 DIAGNOSIS — E039 Hypothyroidism, unspecified: Secondary | ICD-10-CM | POA: Diagnosis not present

## 2019-01-10 DIAGNOSIS — D631 Anemia in chronic kidney disease: Secondary | ICD-10-CM | POA: Diagnosis not present

## 2019-01-10 DIAGNOSIS — Z981 Arthrodesis status: Secondary | ICD-10-CM | POA: Diagnosis not present

## 2019-01-10 DIAGNOSIS — Z9181 History of falling: Secondary | ICD-10-CM | POA: Diagnosis not present

## 2019-01-10 DIAGNOSIS — N183 Chronic kidney disease, stage 3 (moderate): Secondary | ICD-10-CM | POA: Diagnosis not present

## 2019-01-10 DIAGNOSIS — I2510 Atherosclerotic heart disease of native coronary artery without angina pectoris: Secondary | ICD-10-CM | POA: Diagnosis not present

## 2019-01-10 DIAGNOSIS — I129 Hypertensive chronic kidney disease with stage 1 through stage 4 chronic kidney disease, or unspecified chronic kidney disease: Secondary | ICD-10-CM | POA: Diagnosis not present

## 2019-01-11 ENCOUNTER — Telehealth: Payer: Self-pay | Admitting: Infectious Diseases

## 2019-01-11 ENCOUNTER — Ambulatory Visit: Payer: Medicare Other | Admitting: Infectious Diseases

## 2019-01-11 DIAGNOSIS — T8141XA Infection following a procedure, superficial incisional surgical site, initial encounter: Secondary | ICD-10-CM | POA: Diagnosis not present

## 2019-01-11 DIAGNOSIS — L02212 Cutaneous abscess of back [any part, except buttock]: Secondary | ICD-10-CM | POA: Diagnosis not present

## 2019-01-11 NOTE — Telephone Encounter (Signed)
COVID-19 Pre-Screening Questions: ° °Do you currently have a fever (>100 °F), chills or unexplained body aches? No  ° °Are you currently experiencing new cough, shortness of breath, sore throat, runny nose? No  °•  °Have you recently travelled outside the state of [place] in the last 14 days? No  °•  °Have you been in contact with someone that is currently pending confirmation of Covid19 testing or has been confirmed to have the Covid19 virus?  No  °

## 2019-01-12 ENCOUNTER — Ambulatory Visit: Payer: Medicare Other | Admitting: Infectious Diseases

## 2019-01-12 ENCOUNTER — Other Ambulatory Visit: Payer: Self-pay

## 2019-01-12 ENCOUNTER — Encounter: Payer: Self-pay | Admitting: Infectious Diseases

## 2019-01-12 VITALS — BP 169/102 | HR 73 | Temp 98.0°F | Wt 200.0 lb

## 2019-01-12 DIAGNOSIS — G061 Intraspinal abscess and granuloma: Secondary | ICD-10-CM | POA: Diagnosis not present

## 2019-01-12 DIAGNOSIS — T8149XA Infection following a procedure, other surgical site, initial encounter: Secondary | ICD-10-CM | POA: Diagnosis not present

## 2019-01-12 DIAGNOSIS — N183 Chronic kidney disease, stage 3 (moderate): Secondary | ICD-10-CM | POA: Diagnosis not present

## 2019-01-12 DIAGNOSIS — N1831 Chronic kidney disease, stage 3a: Secondary | ICD-10-CM

## 2019-01-12 DIAGNOSIS — E1169 Type 2 diabetes mellitus with other specified complication: Secondary | ICD-10-CM | POA: Diagnosis not present

## 2019-01-12 NOTE — Patient Instructions (Signed)
Please have surgeon obtain new sterile cx of wound. Prefer that wound be debrided as we need to know how deep her infection truly is. Return for labs this afternoon after see neurosurgeon. Return to clinic in 3 weeks for re-evaluation.

## 2019-01-12 NOTE — Progress Notes (Signed)
Subjective:    [name], female    DOB: 1941-05-08, 78 y.o.   MRN: [id number]  HPI  The patient is a pleasant 78 year old African-American female with stage III chronic kidney disease, obesity, and diabetes mellitus for reevaluation of a recurrent lumbar wound infection.  She was last seen in our clinic on Dec 08, 2018 at which time it was recommended that she follow-up with her neurosurgeon and plan for possible debridement to her wound.  She has a complex surgical history that involved a lumbar fusion at L3-L5 with a decompressive laminectomy in 2014.  She did relatively well until July 2018 when she underwent a revision of her initial fusion.  By August of that year, she had developed a incisional lumbar wound that required debridement and placement of a wound VAC operative cultures at that time grew Proteus which was treated with IV Rocephin and then transitioned to complete treatment with oral Levaquin for 6 weeks.  She was followed by Dr. Megan Salon at that time.  By November 2018, she was determined "cured" from her infection and she had a wound VAC to remain intact.  Her wound VAC was removed in February 2019 but by November 2019 she re-presented to [facility] for another episode of lumbar infection again with Proteus.  Again her wound was debrided, but her infection was felt to be superficial on this occasion and she was ultimately transitioned to oral amoxicillin 3 times daily and completed approximate 3-week antibiotic treatment course in total ending June 23, 2018.  It does not appear that she followed up in our clinic after discharge from that admission.  Recently, her wound drainage increased prompting her surgeons to send the patient back to our office for further evaluation.  She has not had any updated wound cultures obtained nor has she been on any antibiotics in recent weeks. Unfortunately, she never saw neurosurgeon but has appointment today at 11:30. Still  need cx of wound vs. Debridement as wound probes to bone/hardware. Only cx made available to [place] is >7 months old and unlikely to represent current infection/pathogen.    Past Medical History:  Diagnosis Date   Acute blood loss anemia 03/14/2017   Acute renal failure superimposed on stage 3 chronic kidney disease ([place]) 03/14/2017   AKI (acute kidney injury) ([place]) 02/28/2017   Altered mental status 03/29/2017   Anemia    Aortic atherosclerosis (HCC)    Arthritis    "joints might ache at times; not that bad" (06/10/2018)   Bilateral lower extremity edema 03/04/2017   Bradycardia    Chronic kidney disease    ?? renal insufficiency,    CKD (chronic kidney disease) stage 3, GFR 30-59 ml/min (HCC) 01/15/2017   ?? renal insufficiency, which she thinks is coming from "all these medications"   Diabetes mellitus without complication ([place])    diagnosed 4-5 yrs ago, 06/21/18- "that was years ago"   Disease of pancreas    Diverticulitis    s/p perforation and partial colectomy 01/27/14 with 3 benign lymph nodes    Diverticulosis 01/15/2017   DJD (degenerative joint disease)    DM (diabetes mellitus), type 2 with renal complications ([place]) 3/53/6144   Fatty liver    Hypertension    Hypothyroidism    "had radiation" (06/10/2018)   Kidney stone    Lethargy 02/28/2017   Obesity, Class III, BMI 40-49.9 (morbid obesity) ([place]) 02/27/2017   Pleural lipoma    Postoperative wound infection 04/02/2017   Spinal  stenosis of lumbar region 01/15/2017   Status post lumbar surgery    Wound healing, delayed    back    Past Surgical History:  Procedure Laterality Date   ABDOMINAL EXPOSURE N/A 02/24/2017   Procedure: ABDOMINAL EXPOSURE;  Surgeon: Angelia Mould, MD;  Location: [place];  Service: Vascular;  Laterality: N/A;   [place]   no h/o abnormal paps    ANTERIOR LAT LUMBAR FUSION N/A 02/24/2017   Procedure: Lumbar three- five Anterior lateral lumbar  interbody fusion;  Surgeon: Ditty, Kevan Ny, MD;  Location: [place];  Service: Neurosurgery;  Laterality: N/A;  L3-5 Anterior lateral lumbar interbody fusion with removal of coflex at L3-4, L4-5   ANTERIOR LUMBAR FUSION N/A 02/24/2017   Procedure: Stage 1: Lumbar five-Sacral one Anterior lumbar interbody fusion;  Surgeon: Ditty, Kevan Ny, MD;  Location: [place];  Service: Neurosurgery;  Laterality: N/A;  Stage 1: L5-S1 Anterior lumbar interbody fusion   APPENDECTOMY     APPLICATION OF ROBOTIC ASSISTANCE FOR SPINAL PROCEDURE N/A 02/26/2017   Procedure: APPLICATION OF ROBOTIC ASSISTANCE FOR SPINAL PROCEDURE;  Surgeon: Ditty, Kevan Ny, MD;  Location: [place];  Service: Neurosurgery;  Laterality: N/A;   APPLICATION OF WOUND VAC N/A 03/30/2017   Procedure: APPLICATION OF WOUND VAC;  Surgeon: Ditty, Kevan Ny, MD;  Location: [place];  Service: Neurosurgery;  Laterality: N/A;   BACK SURGERY  2013   CATARACT EXTRACTION W/ INTRAOCULAR LENS IMPLANT Right    CHOLECYSTECTOMY OPEN  1982   COLON SURGERY  01/26/2014   desc.sigmoid colectomy and ventral hernia repair and splenic flexure mobilization   COLON SURGERY     DILATION AND CURETTAGE OF UTERUS     HERNIA REPAIR     LUMBAR LAMINECTOMY WITH SPINOUS PROCESS PLATE 2 LEVEL N/A 1/61/0960   Procedure: LUMBAR LAMINECTOMY/DECOMPRESSION MICRODISCECTOMY CoFlex;  Surgeon: Faythe Ghee, MD;  Location: [facility];  Service: Neurosurgery;  Laterality: N/A;  Lumbar three-four,Lumbar Four-Five Laminectomy with Coflex   LUMBAR WOUND DEBRIDEMENT N/A 03/30/2017   Procedure: Lumbar wound exploration/debridement, placement of wound vac;  Surgeon: Ditty, Kevan Ny, MD;  Location: [place];  Service: Neurosurgery;  Laterality: N/A;  Lumbar wound exploration/debridement, placement of wound vac   LUMBAR WOUND DEBRIDEMENT N/A 06/06/2018   Procedure: LUMBAR WOUND DEBRIDEMENT/EXPLORATION;  Surgeon: Consuella Lose, MD;  Location: [place];  Service:  Neurosurgery;  Laterality: N/A;   LUMBAR WOUND DEBRIDEMENT N/A 06/22/2018   Procedure: SIMPLE INCISION AND DRAINAGE OF WOUND, APPLICATION OF WOUND VAC;  Surgeon: Consuella Lose, MD;  Location: [place];  Service: Neurosurgery;  Laterality: N/A;   PARTIAL COLECTOMY     01/27/14 diverticulitis and 3 benign lymph nodes [facility] Dr. Pat Patrick    REDUCTION MAMMAPLASTY Bilateral [place]  2014     Family History  Problem Relation Age of Onset   Diabetes Father    Heart attack Father    Hypertension Father    Heart disease Father    Kidney disease Father    Diabetes Mother    Hypertension Mother    Heart disease Mother    Kidney disease Mother    Lupus Sister    Heart disease Brother    Diabetes Brother    Heart disease Brother    Diabetes Brother    Lupus Sister      Social History   Tobacco Use   Smoking status: Never Smoker   Smokeless tobacco: Never Used  Substance Use Topics   Alcohol use: Not Currently    Frequency: Never    Comment: "nothing since age 8" (06/10/2018)   Drug use: Never      reports previously being sexually active.   Outpatient Medications Prior to Visit  Medication Sig Dispense Refill   acetaminophen (TYLENOL) 500 MG tablet Take 500 mg by mouth 2 (two) times daily as needed for mild pain.      amLODipine (NORVASC) 10 MG tablet Take 1 tablet (10 mg total) by mouth daily. 30 tablet 0   atorvastatin (LIPITOR) 40 MG tablet Take 1 tablet (40 mg total) by mouth daily at 6 PM. 90 tablet 3   carvedilol (COREG) 25 MG tablet Take 1 tablet (25 mg total) by mouth 2 (two) times daily with a meal. 60 tablet 0   ferrous sulfate 325 (65 FE) MG tablet Take 325 mg by mouth daily with breakfast.     furosemide (LASIX) 40 MG tablet Take 1 tablet (40 mg total) by mouth daily. In am 90 tablet 1   gabapentin (NEURONTIN) 300 MG capsule Take 300 mg by mouth daily as needed (nerve pain).     hydrALAZINE  (APRESOLINE) 50 MG tablet Take 1 tablet (50 mg total) by mouth every 8 (eight) hours. 90 tablet 0   isosorbide mononitrate (IMDUR) 30 MG 24 hr tablet Take 1 tablet (30 mg total) by mouth daily. 1 tablet 0   labetalol (NORMODYNE) 100 MG tablet Take 100 mg by mouth 2 (two) times daily.     levothyroxine (SYNTHROID, LEVOTHROID) 100 MCG tablet Take 100 mcg by mouth daily before breakfast.     No facility-administered medications prior to visit.      Allergies  Allergen Reactions   Clams [Shellfish Allergy] Swelling and Other (See Comments)    THROAT SWELLS NECK TURNS RED   Hydralazine Other (See Comments)    CHEST TIGHTNESS Patient has tolerated multiple doses of hydralazine since allergy was listed    Norvasc [Amlodipine Besylate] Swelling    Leg edema    Milk-Related Compounds Diarrhea      Review of Systems     Objective:     Physical Exam Gen: pleasant, anxious, mild distress secondary to lumbar pain/wound, A&Ox 3 Head: NCAT, no temporal wasting evident EENT: PERRL, EOMI, MMM, adequate dentition Neck: supple, no JVD CV: NRRR, no murmurs evident Pulm: CTA bilaterally, no wheeze or retractions Abd: soft, obese, NTND, +BS MSK: well-demarcated open lumbar wound with heavy seropurulent drainage noted when packing was removed, no hardware or bone exposed but cx swab did seem to probe to bone+/- hardware during exam today Extrems: 1+ non-pitting LE edema, 2+ pulses Skin: no rashes, adequate skin turgor Neuro: CN II-XII grossly intact, no focal neurologic deficits appreciated, gait was slowed and wide stanced, A&Ox 3   Labs: Lab Results  Component Value Date   WBC 7.2 12/08/2018   HGB 9.9 (L) 12/08/2018   HCT 30.4 (L) 12/08/2018   MCV 84.4 12/08/2018   PLT 369 12/08/2018   Lab Results  Component Value Date   NA 137 12/08/2018   K 4.5 12/08/2018   CL 107 12/08/2018   CO2 24 12/08/2018   GLUCOSE 83 12/08/2018   BUN 34 (H) 12/08/2018   CREATININE 2.24 (H)  12/08/2018   CALCIUM 9.0 12/08/2018   MG 1.9 06/09/2018   PHOS 3.1 03/29/2017   Lab Results  Component Value Date   CRP 4.9 12/08/2018       Assessment & Plan:  The patient is a 78 year old African-American obese female diabetic with stage III chronic kidney disease and recurrent lumbar wound infection worrisome for vertebral osteomyelitis/prosthetic joint infection.  Lumbar wound infection/abscess -despite the patient's 2 recent episodes of infection to her lumbar spine and Proteus, there is no assurance that the same pathogen is the causative agent at this time.  She has not received any oral antibiotics prior to this visit she has an upcoming visit with her neurosurgeon today.  I reminded the patient to ask her neurosurgeon to call me to discuss her care as I have patient in the past and not received responses. At this time, I would advise a formal I&D to the patient's surgical wound and obtaining cultures and initiating antibiotics based on updated culture data.  If the patient is admitted to accomplish these tasks, please consult our infectious these services we will be happy to follow along with you.  I am concerned about adding oral antibiotics and appropriately back to the patient empirically without culture guidance given that she will have an increased risk to infect her bone and her hardware.  If indeed the patient has progressed to infection of her spinal hardware, not only which she need to be considered for potential removal of sections of her fusion apparatus, also she would require typically 8 weeks of parenteral antibiotic therapy to be followed by months of oral consolidative therapy thereafter.  The only benefit she currently has to have an open wound is that drainage will evacuate through this opening rather than hopefully burrowing into deeper towards her spine and hardware.  Stage III CRI - Given her renal insufficiency, she is at higher risk for renal complications for  prolonged antibiotic therapy, so empiric antibiotic therapy is not advised as this would deprive [place] of knowing what are other antibiotic options might be should she develop further problems.  BMP obtained at her last visit shows progression of her renal disease now with a creatinine of 2.24, so we will repeat the patient's BMP today to reassess.  Her renal function trend is also needed in preparation/anticipation that she will likely require antibiotics in the near future.  Diabetes mellitus- patient was instructed to aim for a goal a.m. blood sugar of less than 150 and if possible maintain her blood sugars below this level at all times to optimize her infectious outcome.  Will defer hypoglycemic medications to her primary physician.  Currently, it appears as though the patient is diet controlled.

## 2019-01-13 DIAGNOSIS — E1122 Type 2 diabetes mellitus with diabetic chronic kidney disease: Secondary | ICD-10-CM | POA: Diagnosis not present

## 2019-01-13 DIAGNOSIS — Z981 Arthrodesis status: Secondary | ICD-10-CM | POA: Diagnosis not present

## 2019-01-13 DIAGNOSIS — I129 Hypertensive chronic kidney disease with stage 1 through stage 4 chronic kidney disease, or unspecified chronic kidney disease: Secondary | ICD-10-CM | POA: Diagnosis not present

## 2019-01-13 DIAGNOSIS — K5730 Diverticulosis of large intestine without perforation or abscess without bleeding: Secondary | ICD-10-CM | POA: Diagnosis not present

## 2019-01-13 DIAGNOSIS — L02212 Cutaneous abscess of back [any part, except buttock]: Secondary | ICD-10-CM | POA: Diagnosis not present

## 2019-01-13 DIAGNOSIS — E785 Hyperlipidemia, unspecified: Secondary | ICD-10-CM | POA: Diagnosis not present

## 2019-01-13 DIAGNOSIS — T8141XA Infection following a procedure, superficial incisional surgical site, initial encounter: Secondary | ICD-10-CM | POA: Diagnosis not present

## 2019-01-13 DIAGNOSIS — D631 Anemia in chronic kidney disease: Secondary | ICD-10-CM | POA: Diagnosis not present

## 2019-01-13 DIAGNOSIS — M1990 Unspecified osteoarthritis, unspecified site: Secondary | ICD-10-CM | POA: Diagnosis not present

## 2019-01-13 DIAGNOSIS — N183 Chronic kidney disease, stage 3 (moderate): Secondary | ICD-10-CM | POA: Diagnosis not present

## 2019-01-13 DIAGNOSIS — Z9181 History of falling: Secondary | ICD-10-CM | POA: Diagnosis not present

## 2019-01-13 DIAGNOSIS — I2510 Atherosclerotic heart disease of native coronary artery without angina pectoris: Secondary | ICD-10-CM | POA: Diagnosis not present

## 2019-01-13 DIAGNOSIS — E039 Hypothyroidism, unspecified: Secondary | ICD-10-CM | POA: Diagnosis not present

## 2019-01-13 DIAGNOSIS — Z792 Long term (current) use of antibiotics: Secondary | ICD-10-CM | POA: Diagnosis not present

## 2019-01-13 DIAGNOSIS — M48061 Spinal stenosis, lumbar region without neurogenic claudication: Secondary | ICD-10-CM | POA: Diagnosis not present

## 2019-01-13 LAB — CBC WITH DIFFERENTIAL/PLATELET
Absolute Monocytes: 883 cells/uL (ref 200–950)
Basophils Absolute: 32 cells/uL (ref 0–200)
Basophils Relative: 0.4 %
Eosinophils Absolute: 81 cells/uL (ref 15–500)
Eosinophils Relative: 1 %
HCT: 29.7 % — ABNORMAL LOW (ref 35.0–45.0)
Hemoglobin: 9.6 g/dL — ABNORMAL LOW (ref 11.7–15.5)
Lymphs Abs: 1531 cells/uL (ref 850–3900)
MCH: 27.2 pg (ref 27.0–33.0)
MCHC: 32.3 g/dL (ref 32.0–36.0)
MCV: 84.1 fL (ref 80.0–100.0)
MPV: 8.9 fL (ref 7.5–12.5)
Monocytes Relative: 10.9 %
Neutro Abs: 5573 cells/uL (ref 1500–7800)
Neutrophils Relative %: 68.8 %
Platelets: 389 10*3/uL (ref 140–400)
RBC: 3.53 10*6/uL — ABNORMAL LOW (ref 3.80–5.10)
RDW: 14.5 % (ref 11.0–15.0)
Total Lymphocyte: 18.9 %
WBC: 8.1 10*3/uL (ref 3.8–10.8)

## 2019-01-13 LAB — COMPREHENSIVE METABOLIC PANEL
AG Ratio: 0.7 (calc) — ABNORMAL LOW (ref 1.0–2.5)
ALT: 11 U/L (ref 6–29)
AST: 20 U/L (ref 10–35)
Albumin: 2.7 g/dL — ABNORMAL LOW (ref 3.6–5.1)
Alkaline phosphatase (APISO): 76 U/L (ref 37–153)
BUN/Creatinine Ratio: 20 (calc) (ref 6–22)
BUN: 53 mg/dL — ABNORMAL HIGH (ref 7–25)
CO2: 22 mmol/L (ref 20–32)
Calcium: 8.7 mg/dL (ref 8.6–10.4)
Chloride: 108 mmol/L (ref 98–110)
Creat: 2.64 mg/dL — ABNORMAL HIGH (ref 0.60–0.93)
Globulin: 3.9 g/dL (calc) — ABNORMAL HIGH (ref 1.9–3.7)
Glucose, Bld: 75 mg/dL (ref 65–99)
Potassium: 4.7 mmol/L (ref 3.5–5.3)
Sodium: 138 mmol/L (ref 135–146)
Total Bilirubin: 0.2 mg/dL (ref 0.2–1.2)
Total Protein: 6.6 g/dL (ref 6.1–8.1)

## 2019-01-13 LAB — C-REACTIVE PROTEIN: CRP: 2.8 mg/L (ref <8.0)

## 2019-01-17 DIAGNOSIS — E785 Hyperlipidemia, unspecified: Secondary | ICD-10-CM | POA: Diagnosis not present

## 2019-01-17 DIAGNOSIS — N183 Chronic kidney disease, stage 3 (moderate): Secondary | ICD-10-CM | POA: Diagnosis not present

## 2019-01-17 DIAGNOSIS — M48061 Spinal stenosis, lumbar region without neurogenic claudication: Secondary | ICD-10-CM | POA: Diagnosis not present

## 2019-01-17 DIAGNOSIS — T8141XA Infection following a procedure, superficial incisional surgical site, initial encounter: Secondary | ICD-10-CM | POA: Diagnosis not present

## 2019-01-17 DIAGNOSIS — K5730 Diverticulosis of large intestine without perforation or abscess without bleeding: Secondary | ICD-10-CM | POA: Diagnosis not present

## 2019-01-17 DIAGNOSIS — M1990 Unspecified osteoarthritis, unspecified site: Secondary | ICD-10-CM | POA: Diagnosis not present

## 2019-01-17 DIAGNOSIS — E1122 Type 2 diabetes mellitus with diabetic chronic kidney disease: Secondary | ICD-10-CM | POA: Diagnosis not present

## 2019-01-17 DIAGNOSIS — I2510 Atherosclerotic heart disease of native coronary artery without angina pectoris: Secondary | ICD-10-CM | POA: Diagnosis not present

## 2019-01-17 DIAGNOSIS — E039 Hypothyroidism, unspecified: Secondary | ICD-10-CM | POA: Diagnosis not present

## 2019-01-17 DIAGNOSIS — I129 Hypertensive chronic kidney disease with stage 1 through stage 4 chronic kidney disease, or unspecified chronic kidney disease: Secondary | ICD-10-CM | POA: Diagnosis not present

## 2019-01-17 DIAGNOSIS — D631 Anemia in chronic kidney disease: Secondary | ICD-10-CM | POA: Diagnosis not present

## 2019-01-17 DIAGNOSIS — L02212 Cutaneous abscess of back [any part, except buttock]: Secondary | ICD-10-CM | POA: Diagnosis not present

## 2019-01-17 DIAGNOSIS — Z9181 History of falling: Secondary | ICD-10-CM | POA: Diagnosis not present

## 2019-01-17 DIAGNOSIS — Z792 Long term (current) use of antibiotics: Secondary | ICD-10-CM | POA: Diagnosis not present

## 2019-01-17 DIAGNOSIS — Z981 Arthrodesis status: Secondary | ICD-10-CM | POA: Diagnosis not present

## 2019-01-20 DIAGNOSIS — L02212 Cutaneous abscess of back [any part, except buttock]: Secondary | ICD-10-CM | POA: Diagnosis not present

## 2019-01-20 DIAGNOSIS — Z981 Arthrodesis status: Secondary | ICD-10-CM | POA: Diagnosis not present

## 2019-01-20 DIAGNOSIS — N183 Chronic kidney disease, stage 3 (moderate): Secondary | ICD-10-CM | POA: Diagnosis not present

## 2019-01-20 DIAGNOSIS — Z9181 History of falling: Secondary | ICD-10-CM | POA: Diagnosis not present

## 2019-01-20 DIAGNOSIS — E1122 Type 2 diabetes mellitus with diabetic chronic kidney disease: Secondary | ICD-10-CM | POA: Diagnosis not present

## 2019-01-20 DIAGNOSIS — I129 Hypertensive chronic kidney disease with stage 1 through stage 4 chronic kidney disease, or unspecified chronic kidney disease: Secondary | ICD-10-CM | POA: Diagnosis not present

## 2019-01-20 DIAGNOSIS — I2510 Atherosclerotic heart disease of native coronary artery without angina pectoris: Secondary | ICD-10-CM | POA: Diagnosis not present

## 2019-01-20 DIAGNOSIS — K5730 Diverticulosis of large intestine without perforation or abscess without bleeding: Secondary | ICD-10-CM | POA: Diagnosis not present

## 2019-01-20 DIAGNOSIS — Z792 Long term (current) use of antibiotics: Secondary | ICD-10-CM | POA: Diagnosis not present

## 2019-01-20 DIAGNOSIS — E785 Hyperlipidemia, unspecified: Secondary | ICD-10-CM | POA: Diagnosis not present

## 2019-01-20 DIAGNOSIS — E039 Hypothyroidism, unspecified: Secondary | ICD-10-CM | POA: Diagnosis not present

## 2019-01-20 DIAGNOSIS — M48061 Spinal stenosis, lumbar region without neurogenic claudication: Secondary | ICD-10-CM | POA: Diagnosis not present

## 2019-01-20 DIAGNOSIS — T8141XA Infection following a procedure, superficial incisional surgical site, initial encounter: Secondary | ICD-10-CM | POA: Diagnosis not present

## 2019-01-20 DIAGNOSIS — M1990 Unspecified osteoarthritis, unspecified site: Secondary | ICD-10-CM | POA: Diagnosis not present

## 2019-01-20 DIAGNOSIS — D631 Anemia in chronic kidney disease: Secondary | ICD-10-CM | POA: Diagnosis not present

## 2019-01-24 DIAGNOSIS — I129 Hypertensive chronic kidney disease with stage 1 through stage 4 chronic kidney disease, or unspecified chronic kidney disease: Secondary | ICD-10-CM | POA: Diagnosis not present

## 2019-01-24 DIAGNOSIS — Z981 Arthrodesis status: Secondary | ICD-10-CM | POA: Diagnosis not present

## 2019-01-24 DIAGNOSIS — T8141XA Infection following a procedure, superficial incisional surgical site, initial encounter: Secondary | ICD-10-CM | POA: Diagnosis not present

## 2019-01-24 DIAGNOSIS — Z792 Long term (current) use of antibiotics: Secondary | ICD-10-CM | POA: Diagnosis not present

## 2019-01-24 DIAGNOSIS — E039 Hypothyroidism, unspecified: Secondary | ICD-10-CM | POA: Diagnosis not present

## 2019-01-24 DIAGNOSIS — K5730 Diverticulosis of large intestine without perforation or abscess without bleeding: Secondary | ICD-10-CM | POA: Diagnosis not present

## 2019-01-24 DIAGNOSIS — E1122 Type 2 diabetes mellitus with diabetic chronic kidney disease: Secondary | ICD-10-CM | POA: Diagnosis not present

## 2019-01-24 DIAGNOSIS — Z9181 History of falling: Secondary | ICD-10-CM | POA: Diagnosis not present

## 2019-01-24 DIAGNOSIS — E785 Hyperlipidemia, unspecified: Secondary | ICD-10-CM | POA: Diagnosis not present

## 2019-01-24 DIAGNOSIS — D631 Anemia in chronic kidney disease: Secondary | ICD-10-CM | POA: Diagnosis not present

## 2019-01-24 DIAGNOSIS — I2510 Atherosclerotic heart disease of native coronary artery without angina pectoris: Secondary | ICD-10-CM | POA: Diagnosis not present

## 2019-01-24 DIAGNOSIS — M1990 Unspecified osteoarthritis, unspecified site: Secondary | ICD-10-CM | POA: Diagnosis not present

## 2019-01-24 DIAGNOSIS — L02212 Cutaneous abscess of back [any part, except buttock]: Secondary | ICD-10-CM | POA: Diagnosis not present

## 2019-01-24 DIAGNOSIS — M48061 Spinal stenosis, lumbar region without neurogenic claudication: Secondary | ICD-10-CM | POA: Diagnosis not present

## 2019-01-24 DIAGNOSIS — N183 Chronic kidney disease, stage 3 (moderate): Secondary | ICD-10-CM | POA: Diagnosis not present

## 2019-01-28 DIAGNOSIS — M48061 Spinal stenosis, lumbar region without neurogenic claudication: Secondary | ICD-10-CM | POA: Diagnosis not present

## 2019-01-28 DIAGNOSIS — M1990 Unspecified osteoarthritis, unspecified site: Secondary | ICD-10-CM | POA: Diagnosis not present

## 2019-01-28 DIAGNOSIS — D631 Anemia in chronic kidney disease: Secondary | ICD-10-CM | POA: Diagnosis not present

## 2019-01-28 DIAGNOSIS — I2510 Atherosclerotic heart disease of native coronary artery without angina pectoris: Secondary | ICD-10-CM | POA: Diagnosis not present

## 2019-01-28 DIAGNOSIS — Z9181 History of falling: Secondary | ICD-10-CM | POA: Diagnosis not present

## 2019-01-28 DIAGNOSIS — K5730 Diverticulosis of large intestine without perforation or abscess without bleeding: Secondary | ICD-10-CM | POA: Diagnosis not present

## 2019-01-28 DIAGNOSIS — Z981 Arthrodesis status: Secondary | ICD-10-CM | POA: Diagnosis not present

## 2019-01-28 DIAGNOSIS — N183 Chronic kidney disease, stage 3 (moderate): Secondary | ICD-10-CM | POA: Diagnosis not present

## 2019-01-28 DIAGNOSIS — Z792 Long term (current) use of antibiotics: Secondary | ICD-10-CM | POA: Diagnosis not present

## 2019-01-28 DIAGNOSIS — E1122 Type 2 diabetes mellitus with diabetic chronic kidney disease: Secondary | ICD-10-CM | POA: Diagnosis not present

## 2019-01-28 DIAGNOSIS — E039 Hypothyroidism, unspecified: Secondary | ICD-10-CM | POA: Diagnosis not present

## 2019-01-28 DIAGNOSIS — I129 Hypertensive chronic kidney disease with stage 1 through stage 4 chronic kidney disease, or unspecified chronic kidney disease: Secondary | ICD-10-CM | POA: Diagnosis not present

## 2019-01-28 DIAGNOSIS — T8141XA Infection following a procedure, superficial incisional surgical site, initial encounter: Secondary | ICD-10-CM | POA: Diagnosis not present

## 2019-01-28 DIAGNOSIS — L02212 Cutaneous abscess of back [any part, except buttock]: Secondary | ICD-10-CM | POA: Diagnosis not present

## 2019-01-28 DIAGNOSIS — E785 Hyperlipidemia, unspecified: Secondary | ICD-10-CM | POA: Diagnosis not present

## 2019-01-31 DIAGNOSIS — T8141XA Infection following a procedure, superficial incisional surgical site, initial encounter: Secondary | ICD-10-CM | POA: Diagnosis not present

## 2019-01-31 DIAGNOSIS — E039 Hypothyroidism, unspecified: Secondary | ICD-10-CM | POA: Diagnosis not present

## 2019-01-31 DIAGNOSIS — E1122 Type 2 diabetes mellitus with diabetic chronic kidney disease: Secondary | ICD-10-CM | POA: Diagnosis not present

## 2019-01-31 DIAGNOSIS — M1990 Unspecified osteoarthritis, unspecified site: Secondary | ICD-10-CM | POA: Diagnosis not present

## 2019-01-31 DIAGNOSIS — D631 Anemia in chronic kidney disease: Secondary | ICD-10-CM | POA: Diagnosis not present

## 2019-01-31 DIAGNOSIS — E785 Hyperlipidemia, unspecified: Secondary | ICD-10-CM | POA: Diagnosis not present

## 2019-01-31 DIAGNOSIS — Z9181 History of falling: Secondary | ICD-10-CM | POA: Diagnosis not present

## 2019-01-31 DIAGNOSIS — I129 Hypertensive chronic kidney disease with stage 1 through stage 4 chronic kidney disease, or unspecified chronic kidney disease: Secondary | ICD-10-CM | POA: Diagnosis not present

## 2019-01-31 DIAGNOSIS — K5730 Diverticulosis of large intestine without perforation or abscess without bleeding: Secondary | ICD-10-CM | POA: Diagnosis not present

## 2019-01-31 DIAGNOSIS — I2510 Atherosclerotic heart disease of native coronary artery without angina pectoris: Secondary | ICD-10-CM | POA: Diagnosis not present

## 2019-01-31 DIAGNOSIS — L02212 Cutaneous abscess of back [any part, except buttock]: Secondary | ICD-10-CM | POA: Diagnosis not present

## 2019-01-31 DIAGNOSIS — Z981 Arthrodesis status: Secondary | ICD-10-CM | POA: Diagnosis not present

## 2019-01-31 DIAGNOSIS — N183 Chronic kidney disease, stage 3 (moderate): Secondary | ICD-10-CM | POA: Diagnosis not present

## 2019-01-31 DIAGNOSIS — M48061 Spinal stenosis, lumbar region without neurogenic claudication: Secondary | ICD-10-CM | POA: Diagnosis not present

## 2019-01-31 DIAGNOSIS — Z792 Long term (current) use of antibiotics: Secondary | ICD-10-CM | POA: Diagnosis not present

## 2019-02-02 ENCOUNTER — Ambulatory Visit: Payer: Medicare Other | Admitting: Infectious Diseases

## 2019-02-02 ENCOUNTER — Other Ambulatory Visit: Payer: Self-pay

## 2019-02-02 ENCOUNTER — Encounter: Payer: Self-pay | Admitting: Infectious Diseases

## 2019-02-02 VITALS — BP 209/70 | HR 87 | Ht 62.0 in | Wt 208.0 lb

## 2019-02-02 DIAGNOSIS — N183 Chronic kidney disease, stage 3 (moderate): Secondary | ICD-10-CM

## 2019-02-02 DIAGNOSIS — G061 Intraspinal abscess and granuloma: Secondary | ICD-10-CM

## 2019-02-02 DIAGNOSIS — E1169 Type 2 diabetes mellitus with other specified complication: Secondary | ICD-10-CM | POA: Diagnosis not present

## 2019-02-02 DIAGNOSIS — N1831 Chronic kidney disease, stage 3a: Secondary | ICD-10-CM

## 2019-02-02 NOTE — Progress Notes (Signed)
Subjective:    [name], female    DOB: 10-24-1940, 78 y.o.   MRN: [id number]  [place] patient is a pleasant 78 year old African-American female with stage III chronic kidney disease, obesity, and diabetes mellitus for reevaluation of a recurrent lumbar wound infection. She was last seen in our clinic on January 12, 2019 at which time it was recommended that she follow-up with her neurosurgeon and plan for possible debridement to her wound.   Although she did see her neurosurgeon that day, I still have had no luck speaking with him about her care.  I also do not have a copy of his note from the clinic from the last visit she had with him. She has a complex surgical history that involved a lumbar fusion at L3-L5 with a decompressive laminectomy in 2014. She did relatively well until July 2018 when she underwent a revision of her initial fusion. By August of that year, she had developed a incisional lumbar wound that required debridement and placement of a wound VAC operative cultures at that time grew Proteus which was treated with IV Rocephin and then transitioned to complete treatment with oral Levaquin for 6 weeks. She was followed by Dr. Megan Salon at that time. By November 2018, she was determined "cured" from her infection and she had a wound VAC to remain intact. Her wound VAC was removed in February 2019 but by November 2019 she re-presented to [facility] for another episode of lumbar infection again with Proteus. Again her wound was debrided, but her infection was felt to be superficial on this occasion and she was ultimately transitioned to oral amoxicillin 3 times daily and completed approximate 3-week antibiotic treatment course in total ending June 23, 2018. It does not appear that she followed up in our clinic after discharge from that admission. Recently, her wound drainage increased prompting her surgeons to send the patient back to our office for further  evaluation. She has not had any updated wound cultures obtained nor has she been on any antibiotics in recent weeks. I still need cx of wound vs. Debridement as wound probes to bone/hardware. Only cx made available to [place] is >9 months old and unlikely to represent current infection/pathogen.     Past Medical History:  Diagnosis Date   Acute blood loss anemia 03/14/2017   Acute renal failure superimposed on stage 3 chronic kidney disease ([place]) 03/14/2017   AKI (acute kidney injury) ([place]) 02/28/2017   Altered mental status 03/29/2017   Anemia    Aortic atherosclerosis (HCC)    Arthritis    "joints might ache at times; not that bad" (06/10/2018)   Bilateral lower extremity edema 03/04/2017   Bradycardia    Chronic kidney disease    ?? renal insufficiency,    CKD (chronic kidney disease) stage 3, GFR 30-59 ml/min (HCC) 01/15/2017   ?? renal insufficiency, which she thinks is coming from "all these medications"   Diabetes mellitus without complication ([place])    diagnosed 4-5 yrs ago, 06/21/18- "that was years ago"   Disease of pancreas    Diverticulitis    s/p perforation and partial colectomy 01/27/14 with 3 benign lymph nodes    Diverticulosis 01/15/2017   DJD (degenerative joint disease)    DM (diabetes mellitus), type 2 with renal complications ([place]) 04/01/5620   Fatty liver    Hypertension    Hypothyroidism    "had radiation" (06/10/2018)   Kidney stone    Lethargy 02/28/2017   Obesity,  Class III, BMI 40-49.9 (morbid obesity) ([place]) 02/27/2017   Pleural lipoma    Postoperative wound infection 04/02/2017   Spinal stenosis of lumbar region 01/15/2017   Status post lumbar surgery    Wound healing, delayed    back    Past Surgical History:  Procedure Laterality Date   ABDOMINAL EXPOSURE N/A 02/24/2017   Procedure: ABDOMINAL EXPOSURE;  Surgeon: Angelia Mould, MD;  Location: [place];  Service: Vascular;  Laterality: N/A;   [place]   no  h/o abnormal paps    ANTERIOR LAT LUMBAR FUSION N/A 02/24/2017   Procedure: Lumbar three- five Anterior lateral lumbar interbody fusion;  Surgeon: Ditty, Kevan Ny, MD;  Location: [place];  Service: Neurosurgery;  Laterality: N/A;  L3-5 Anterior lateral lumbar interbody fusion with removal of coflex at L3-4, L4-5   ANTERIOR LUMBAR FUSION N/A 02/24/2017   Procedure: Stage 1: Lumbar five-Sacral one Anterior lumbar interbody fusion;  Surgeon: Ditty, Kevan Ny, MD;  Location: [place];  Service: Neurosurgery;  Laterality: N/A;  Stage 1: L5-S1 Anterior lumbar interbody fusion   APPENDECTOMY     APPLICATION OF ROBOTIC ASSISTANCE FOR SPINAL PROCEDURE N/A 02/26/2017   Procedure: APPLICATION OF ROBOTIC ASSISTANCE FOR SPINAL PROCEDURE;  Surgeon: Ditty, Kevan Ny, MD;  Location: [place];  Service: Neurosurgery;  Laterality: N/A;   APPLICATION OF WOUND VAC N/A 03/30/2017   Procedure: APPLICATION OF WOUND VAC;  Surgeon: Ditty, Kevan Ny, MD;  Location: [place];  Service: Neurosurgery;  Laterality: N/A;   BACK SURGERY  2013   CATARACT EXTRACTION W/ INTRAOCULAR LENS IMPLANT Right    CHOLECYSTECTOMY OPEN  1982   COLON SURGERY  01/26/2014   desc.sigmoid colectomy and ventral hernia repair and splenic flexure mobilization   COLON SURGERY     DILATION AND CURETTAGE OF UTERUS     HERNIA REPAIR     LUMBAR LAMINECTOMY WITH SPINOUS PROCESS PLATE 2 LEVEL N/A 0/27/7412   Procedure: LUMBAR LAMINECTOMY/DECOMPRESSION MICRODISCECTOMY CoFlex;  Surgeon: Faythe Ghee, MD;  Location: [facility];  Service: Neurosurgery;  Laterality: N/A;  Lumbar three-four,Lumbar Four-Five Laminectomy with Coflex   LUMBAR WOUND DEBRIDEMENT N/A 03/30/2017   Procedure: Lumbar wound exploration/debridement, placement of wound vac;  Surgeon: Ditty, Kevan Ny, MD;  Location: [place];  Service: Neurosurgery;  Laterality: N/A;  Lumbar wound exploration/debridement, placement of wound vac   LUMBAR WOUND DEBRIDEMENT N/A  06/06/2018   Procedure: LUMBAR WOUND DEBRIDEMENT/EXPLORATION;  Surgeon: Consuella Lose, MD;  Location: [place];  Service: Neurosurgery;  Laterality: N/A;   LUMBAR WOUND DEBRIDEMENT N/A 06/22/2018   Procedure: SIMPLE INCISION AND DRAINAGE OF WOUND, APPLICATION OF WOUND VAC;  Surgeon: Consuella Lose, MD;  Location: [place];  Service: Neurosurgery;  Laterality: N/A;   PARTIAL COLECTOMY     01/27/14 diverticulitis and 3 benign lymph nodes [facility] Dr. Pat Patrick    REDUCTION MAMMAPLASTY Bilateral [place]  2014     Family History  Problem Relation Age of Onset   Diabetes Father    Heart attack Father    Hypertension Father    Heart disease Father    Kidney disease Father    Diabetes Mother    Hypertension Mother    Heart disease Mother    Kidney disease Mother    Lupus Sister    Heart disease Brother    Diabetes Brother    Heart disease Brother    Diabetes Brother    Lupus Sister  Social History   Tobacco Use   Smoking status: Never Smoker   Smokeless tobacco: Never Used  Substance Use Topics   Alcohol use: Not Currently    Frequency: Never    Comment: "nothing since age 83" (06/10/2018)   Drug use: Never      reports previously being sexually active.   Outpatient Medications Prior to Visit  Medication Sig Dispense Refill   acetaminophen (TYLENOL) 500 MG tablet Take 500 mg by mouth 2 (two) times daily as needed for mild pain.      amLODipine (NORVASC) 10 MG tablet Take 1 tablet (10 mg total) by mouth daily. 30 tablet 0   atorvastatin (LIPITOR) 40 MG tablet Take 1 tablet (40 mg total) by mouth daily at 6 PM. 90 tablet 3   carvedilol (COREG) 25 MG tablet Take 1 tablet (25 mg total) by mouth 2 (two) times daily with a meal. 60 tablet 0   ferrous sulfate 325 (65 FE) MG tablet Take 325 mg by mouth daily with breakfast.     furosemide (LASIX) 40 MG tablet Take 1 tablet (40 mg total) by mouth daily. In am 90 tablet  1   gabapentin (NEURONTIN) 300 MG capsule Take 300 mg by mouth daily as needed (nerve pain).     hydrALAZINE (APRESOLINE) 50 MG tablet Take 1 tablet (50 mg total) by mouth every 8 (eight) hours. 90 tablet 0   isosorbide mononitrate (IMDUR) 30 MG 24 hr tablet Take 1 tablet (30 mg total) by mouth daily. 1 tablet 0   labetalol (NORMODYNE) 100 MG tablet Take 100 mg by mouth 2 (two) times daily.     levothyroxine (SYNTHROID, LEVOTHROID) 100 MCG tablet Take 100 mcg by mouth daily before breakfast.     No facility-administered medications prior to visit.      Allergies  Allergen Reactions   Clams [Shellfish Allergy] Swelling and Other (See Comments)    THROAT SWELLS NECK TURNS RED   Hydralazine Other (See Comments)    CHEST TIGHTNESS Patient has tolerated multiple doses of hydralazine since allergy was listed    Norvasc [Amlodipine Besylate] Swelling    Leg edema    Milk-Related Compounds Diarrhea      Review of Systems  Constitutional: Positive for fatigue. Negative for chills and fever.  HENT: Negative for congestion, hearing loss and sinus pressure.   Eyes: Negative for photophobia, discharge, redness and visual disturbance.  Respiratory: Negative for apnea, cough, shortness of breath and wheezing.   Cardiovascular: Negative for chest pain and leg swelling.  Gastrointestinal: Negative for abdominal distention, abdominal pain, constipation, diarrhea, nausea and vomiting.  Endocrine: Negative for cold intolerance, heat intolerance, polydipsia and polyuria.  Genitourinary: Negative for dysuria, flank pain, frequency, urgency, vaginal bleeding and vaginal discharge.  Musculoskeletal: Positive for back pain. Negative for arthralgias, joint swelling and neck pain.  Skin: Positive for wound. Negative for pallor and rash.  Allergic/Immunologic: Negative for immunocompromised state.  Neurological: Negative for dizziness, seizures, speech difficulty, weakness and headaches.    Hematological: Does not bruise/bleed easily.  Psychiatric/Behavioral: Negative for agitation, confusion, hallucinations and sleep disturbance. The patient is not nervous/anxious.        Objective:    Vitals:   02/02/19 1059  BP: (!) 209/70  Pulse: 87   Physical Exam Gen: pleasant,anxious, mild distress secondary to lumbar pain/wound, A&Ox 3 Head: NCAT, no temporal wasting evident EENT: PERRL, EOMI, MMM, adequate dentition Neck: supple, no JVD CV: NRRR, no murmurs evident Pulm: CTA bilaterally, no wheeze or  retractions Abd: soft,obese,NTND, +BS MSK: well-demarcated open lumbar wound with seropurulent drainage noted when packing was removed, no hardware or bone exposed but cx swab did seem to probe to bone+/- hardware during exam Extrems: 1+ non-pittingLE edema, 2+ pulses Skin: no rashes, adequate skin turgor Neuro: CN II-XII grossly intact, no focal neurologic deficits appreciated, gait wasslowed and wide stanced, A&Ox 3   Labs: Lab Results  Component Value Date   WBC 8.1 01/12/2019   HGB 9.6 (L) 01/12/2019   HCT 29.7 (L) 01/12/2019   MCV 84.1 01/12/2019   PLT 389 01/12/2019   Lab Results  Component Value Date   NA 138 01/12/2019   K 4.7 01/12/2019   CL 108 01/12/2019   CO2 22 01/12/2019   GLUCOSE 75 01/12/2019   BUN 53 (H) 01/12/2019   CREATININE 2.64 (H) 01/12/2019   CALCIUM 8.7 01/12/2019   MG 1.9 06/09/2018   PHOS 3.1 03/29/2017   Lab Results  Component Value Date   CRP 2.8 01/12/2019       Assessment & Plan:  The patient is a 78 year old African-American obese female diabetic with stage III chronic kidney disease and recurrent lumbar wound infection worrisome for vertebral osteomyelitis/prosthetic joint infection.  Lumbar wound infection/abscess-despite the patient's2 recent episodes of infection to her lumbar spine and Proteus, there is no assurance that the same pathogen is the causative agent at this time. She has not received any oral  antibiotics prior to this visit.  She did see her neurosurgeon Dr. Kathyrn Sheriff shortly after her last visit with me.  Unfortunately, I do not have his clinic note nor have I had any success in having a one-on-one discussion with him about the patient's care.  I reminded the patient to ask her neurosurgeon to call me to discuss her care, think she may receive a response. I continue to advise a formal I&D to the patient's surgical wound and obtaining cultures and initiating antibiotics based on updated culture data. If the patient is admitted to accomplish these tasks, please consult our infectious these services as we will be happy to follow along with you. I am concerned about adding oral antibiotics and appropriately back to the patient empirically without culture guidance given that she will have an increased risk to infect her bone and her hardware. If indeed the patient has progressed to infection of her spinal hardware, not only which she need to be considered for potential removal of sections of her fusion apparatus, also she would require typically 8 weeks of parenteral antibiotic therapy to be followed by months of oral consolidative therapy thereafter. The only benefit she currently has to have an open wound is that drainage will evacuate through this opening rather than hopefully burrowing into deeper towards her spine and hardware.  Stage III CRI -Given her renal insufficiency, she is at higher risk forrenalcomplications for prolonged antibiotic therapy,so empiric antibiotic therapy is not advised as this would deprive [place] of knowing what are other antibiotic options might be should she develop further problems.  BMP obtained at her last visit shows progression of her renal disease now with a creatinine of 2.64, so we will repeat the patient's BMP today to reassess.  Her renal function trend is also needed in preparation/anticipation that she will likely require antibiotics in the near  future.  Diabetes mellitus-patient was instructed to aim for a goal a.m. blood sugar of less than 150 and if possible maintain her blood sugars below this level at all times to optimize her  infectious outcome. Will defer hypoglycemic medications to her primary physician. Currently, it appears as though the patient is diet controlled.

## 2019-02-02 NOTE — Patient Instructions (Signed)
Check blood sugars at least once a day. Ask your neurosurgeon to obtain sterile wound cx of your lumbar spine wound vs. debride wound in OR to obtain sterile wound cxs to direct ABX treatment. Return to clinic in 3 weeks.

## 2019-02-03 DIAGNOSIS — Z792 Long term (current) use of antibiotics: Secondary | ICD-10-CM | POA: Diagnosis not present

## 2019-02-03 DIAGNOSIS — T8141XA Infection following a procedure, superficial incisional surgical site, initial encounter: Secondary | ICD-10-CM | POA: Diagnosis not present

## 2019-02-03 DIAGNOSIS — M48061 Spinal stenosis, lumbar region without neurogenic claudication: Secondary | ICD-10-CM | POA: Diagnosis not present

## 2019-02-03 DIAGNOSIS — N183 Chronic kidney disease, stage 3 (moderate): Secondary | ICD-10-CM | POA: Diagnosis not present

## 2019-02-03 DIAGNOSIS — E039 Hypothyroidism, unspecified: Secondary | ICD-10-CM | POA: Diagnosis not present

## 2019-02-03 DIAGNOSIS — M1990 Unspecified osteoarthritis, unspecified site: Secondary | ICD-10-CM | POA: Diagnosis not present

## 2019-02-03 DIAGNOSIS — Z981 Arthrodesis status: Secondary | ICD-10-CM | POA: Diagnosis not present

## 2019-02-03 DIAGNOSIS — Z9181 History of falling: Secondary | ICD-10-CM | POA: Diagnosis not present

## 2019-02-03 DIAGNOSIS — K5730 Diverticulosis of large intestine without perforation or abscess without bleeding: Secondary | ICD-10-CM | POA: Diagnosis not present

## 2019-02-03 DIAGNOSIS — L02212 Cutaneous abscess of back [any part, except buttock]: Secondary | ICD-10-CM | POA: Diagnosis not present

## 2019-02-03 DIAGNOSIS — E785 Hyperlipidemia, unspecified: Secondary | ICD-10-CM | POA: Diagnosis not present

## 2019-02-03 DIAGNOSIS — E1122 Type 2 diabetes mellitus with diabetic chronic kidney disease: Secondary | ICD-10-CM | POA: Diagnosis not present

## 2019-02-03 DIAGNOSIS — I129 Hypertensive chronic kidney disease with stage 1 through stage 4 chronic kidney disease, or unspecified chronic kidney disease: Secondary | ICD-10-CM | POA: Diagnosis not present

## 2019-02-03 DIAGNOSIS — D631 Anemia in chronic kidney disease: Secondary | ICD-10-CM | POA: Diagnosis not present

## 2019-02-03 DIAGNOSIS — I2510 Atherosclerotic heart disease of native coronary artery without angina pectoris: Secondary | ICD-10-CM | POA: Diagnosis not present

## 2019-02-08 DIAGNOSIS — M48061 Spinal stenosis, lumbar region without neurogenic claudication: Secondary | ICD-10-CM | POA: Diagnosis not present

## 2019-02-08 DIAGNOSIS — Z9181 History of falling: Secondary | ICD-10-CM | POA: Diagnosis not present

## 2019-02-08 DIAGNOSIS — K5730 Diverticulosis of large intestine without perforation or abscess without bleeding: Secondary | ICD-10-CM | POA: Diagnosis not present

## 2019-02-08 DIAGNOSIS — I129 Hypertensive chronic kidney disease with stage 1 through stage 4 chronic kidney disease, or unspecified chronic kidney disease: Secondary | ICD-10-CM | POA: Diagnosis not present

## 2019-02-08 DIAGNOSIS — D631 Anemia in chronic kidney disease: Secondary | ICD-10-CM | POA: Diagnosis not present

## 2019-02-08 DIAGNOSIS — Z981 Arthrodesis status: Secondary | ICD-10-CM | POA: Diagnosis not present

## 2019-02-08 DIAGNOSIS — N183 Chronic kidney disease, stage 3 (moderate): Secondary | ICD-10-CM | POA: Diagnosis not present

## 2019-02-08 DIAGNOSIS — M1990 Unspecified osteoarthritis, unspecified site: Secondary | ICD-10-CM | POA: Diagnosis not present

## 2019-02-08 DIAGNOSIS — E039 Hypothyroidism, unspecified: Secondary | ICD-10-CM | POA: Diagnosis not present

## 2019-02-08 DIAGNOSIS — I2510 Atherosclerotic heart disease of native coronary artery without angina pectoris: Secondary | ICD-10-CM | POA: Diagnosis not present

## 2019-02-08 DIAGNOSIS — L02212 Cutaneous abscess of back [any part, except buttock]: Secondary | ICD-10-CM | POA: Diagnosis not present

## 2019-02-08 DIAGNOSIS — E785 Hyperlipidemia, unspecified: Secondary | ICD-10-CM | POA: Diagnosis not present

## 2019-02-08 DIAGNOSIS — T8141XA Infection following a procedure, superficial incisional surgical site, initial encounter: Secondary | ICD-10-CM | POA: Diagnosis not present

## 2019-02-08 DIAGNOSIS — E1122 Type 2 diabetes mellitus with diabetic chronic kidney disease: Secondary | ICD-10-CM | POA: Diagnosis not present

## 2019-02-08 DIAGNOSIS — Z792 Long term (current) use of antibiotics: Secondary | ICD-10-CM | POA: Diagnosis not present

## 2019-02-14 DIAGNOSIS — D631 Anemia in chronic kidney disease: Secondary | ICD-10-CM | POA: Diagnosis not present

## 2019-02-14 DIAGNOSIS — Z792 Long term (current) use of antibiotics: Secondary | ICD-10-CM | POA: Diagnosis not present

## 2019-02-14 DIAGNOSIS — M1990 Unspecified osteoarthritis, unspecified site: Secondary | ICD-10-CM | POA: Diagnosis not present

## 2019-02-14 DIAGNOSIS — E785 Hyperlipidemia, unspecified: Secondary | ICD-10-CM | POA: Diagnosis not present

## 2019-02-14 DIAGNOSIS — T8141XA Infection following a procedure, superficial incisional surgical site, initial encounter: Secondary | ICD-10-CM | POA: Diagnosis not present

## 2019-02-14 DIAGNOSIS — N183 Chronic kidney disease, stage 3 (moderate): Secondary | ICD-10-CM | POA: Diagnosis not present

## 2019-02-14 DIAGNOSIS — M48061 Spinal stenosis, lumbar region without neurogenic claudication: Secondary | ICD-10-CM | POA: Diagnosis not present

## 2019-02-14 DIAGNOSIS — K5730 Diverticulosis of large intestine without perforation or abscess without bleeding: Secondary | ICD-10-CM | POA: Diagnosis not present

## 2019-02-14 DIAGNOSIS — Z9181 History of falling: Secondary | ICD-10-CM | POA: Diagnosis not present

## 2019-02-14 DIAGNOSIS — Z981 Arthrodesis status: Secondary | ICD-10-CM | POA: Diagnosis not present

## 2019-02-14 DIAGNOSIS — L02212 Cutaneous abscess of back [any part, except buttock]: Secondary | ICD-10-CM | POA: Diagnosis not present

## 2019-02-14 DIAGNOSIS — I2510 Atherosclerotic heart disease of native coronary artery without angina pectoris: Secondary | ICD-10-CM | POA: Diagnosis not present

## 2019-02-14 DIAGNOSIS — I129 Hypertensive chronic kidney disease with stage 1 through stage 4 chronic kidney disease, or unspecified chronic kidney disease: Secondary | ICD-10-CM | POA: Diagnosis not present

## 2019-02-14 DIAGNOSIS — E1122 Type 2 diabetes mellitus with diabetic chronic kidney disease: Secondary | ICD-10-CM | POA: Diagnosis not present

## 2019-02-14 DIAGNOSIS — E039 Hypothyroidism, unspecified: Secondary | ICD-10-CM | POA: Diagnosis not present

## 2019-02-16 DIAGNOSIS — T8149XA Infection following a procedure, other surgical site, initial encounter: Secondary | ICD-10-CM | POA: Diagnosis not present

## 2019-02-17 DIAGNOSIS — E1122 Type 2 diabetes mellitus with diabetic chronic kidney disease: Secondary | ICD-10-CM | POA: Diagnosis not present

## 2019-02-17 DIAGNOSIS — Z792 Long term (current) use of antibiotics: Secondary | ICD-10-CM | POA: Diagnosis not present

## 2019-02-17 DIAGNOSIS — L02212 Cutaneous abscess of back [any part, except buttock]: Secondary | ICD-10-CM | POA: Diagnosis not present

## 2019-02-17 DIAGNOSIS — I2510 Atherosclerotic heart disease of native coronary artery without angina pectoris: Secondary | ICD-10-CM | POA: Diagnosis not present

## 2019-02-17 DIAGNOSIS — K5730 Diverticulosis of large intestine without perforation or abscess without bleeding: Secondary | ICD-10-CM | POA: Diagnosis not present

## 2019-02-17 DIAGNOSIS — I129 Hypertensive chronic kidney disease with stage 1 through stage 4 chronic kidney disease, or unspecified chronic kidney disease: Secondary | ICD-10-CM | POA: Diagnosis not present

## 2019-02-17 DIAGNOSIS — M48061 Spinal stenosis, lumbar region without neurogenic claudication: Secondary | ICD-10-CM | POA: Diagnosis not present

## 2019-02-17 DIAGNOSIS — N183 Chronic kidney disease, stage 3 (moderate): Secondary | ICD-10-CM | POA: Diagnosis not present

## 2019-02-17 DIAGNOSIS — T8141XA Infection following a procedure, superficial incisional surgical site, initial encounter: Secondary | ICD-10-CM | POA: Diagnosis not present

## 2019-02-17 DIAGNOSIS — Z9181 History of falling: Secondary | ICD-10-CM | POA: Diagnosis not present

## 2019-02-17 DIAGNOSIS — Z981 Arthrodesis status: Secondary | ICD-10-CM | POA: Diagnosis not present

## 2019-02-17 DIAGNOSIS — E785 Hyperlipidemia, unspecified: Secondary | ICD-10-CM | POA: Diagnosis not present

## 2019-02-17 DIAGNOSIS — E039 Hypothyroidism, unspecified: Secondary | ICD-10-CM | POA: Diagnosis not present

## 2019-02-17 DIAGNOSIS — M1990 Unspecified osteoarthritis, unspecified site: Secondary | ICD-10-CM | POA: Diagnosis not present

## 2019-02-17 DIAGNOSIS — D631 Anemia in chronic kidney disease: Secondary | ICD-10-CM | POA: Diagnosis not present

## 2019-02-22 DIAGNOSIS — M1990 Unspecified osteoarthritis, unspecified site: Secondary | ICD-10-CM | POA: Diagnosis not present

## 2019-02-22 DIAGNOSIS — Z981 Arthrodesis status: Secondary | ICD-10-CM | POA: Diagnosis not present

## 2019-02-22 DIAGNOSIS — Z9181 History of falling: Secondary | ICD-10-CM | POA: Diagnosis not present

## 2019-02-22 DIAGNOSIS — K5730 Diverticulosis of large intestine without perforation or abscess without bleeding: Secondary | ICD-10-CM | POA: Diagnosis not present

## 2019-02-22 DIAGNOSIS — E1122 Type 2 diabetes mellitus with diabetic chronic kidney disease: Secondary | ICD-10-CM | POA: Diagnosis not present

## 2019-02-22 DIAGNOSIS — N183 Chronic kidney disease, stage 3 (moderate): Secondary | ICD-10-CM | POA: Diagnosis not present

## 2019-02-22 DIAGNOSIS — E039 Hypothyroidism, unspecified: Secondary | ICD-10-CM | POA: Diagnosis not present

## 2019-02-22 DIAGNOSIS — T8141XA Infection following a procedure, superficial incisional surgical site, initial encounter: Secondary | ICD-10-CM | POA: Diagnosis not present

## 2019-02-22 DIAGNOSIS — L02212 Cutaneous abscess of back [any part, except buttock]: Secondary | ICD-10-CM | POA: Diagnosis not present

## 2019-02-22 DIAGNOSIS — M48061 Spinal stenosis, lumbar region without neurogenic claudication: Secondary | ICD-10-CM | POA: Diagnosis not present

## 2019-02-22 DIAGNOSIS — E785 Hyperlipidemia, unspecified: Secondary | ICD-10-CM | POA: Diagnosis not present

## 2019-02-22 DIAGNOSIS — I129 Hypertensive chronic kidney disease with stage 1 through stage 4 chronic kidney disease, or unspecified chronic kidney disease: Secondary | ICD-10-CM | POA: Diagnosis not present

## 2019-02-22 DIAGNOSIS — Z792 Long term (current) use of antibiotics: Secondary | ICD-10-CM | POA: Diagnosis not present

## 2019-02-22 DIAGNOSIS — D631 Anemia in chronic kidney disease: Secondary | ICD-10-CM | POA: Diagnosis not present

## 2019-02-22 DIAGNOSIS — I2510 Atherosclerotic heart disease of native coronary artery without angina pectoris: Secondary | ICD-10-CM | POA: Diagnosis not present

## 2019-02-25 DIAGNOSIS — E785 Hyperlipidemia, unspecified: Secondary | ICD-10-CM | POA: Diagnosis not present

## 2019-02-25 DIAGNOSIS — E039 Hypothyroidism, unspecified: Secondary | ICD-10-CM | POA: Diagnosis not present

## 2019-02-25 DIAGNOSIS — M1990 Unspecified osteoarthritis, unspecified site: Secondary | ICD-10-CM | POA: Diagnosis not present

## 2019-02-25 DIAGNOSIS — K5730 Diverticulosis of large intestine without perforation or abscess without bleeding: Secondary | ICD-10-CM | POA: Diagnosis not present

## 2019-02-25 DIAGNOSIS — Z981 Arthrodesis status: Secondary | ICD-10-CM | POA: Diagnosis not present

## 2019-02-25 DIAGNOSIS — Z792 Long term (current) use of antibiotics: Secondary | ICD-10-CM | POA: Diagnosis not present

## 2019-02-25 DIAGNOSIS — I2510 Atherosclerotic heart disease of native coronary artery without angina pectoris: Secondary | ICD-10-CM | POA: Diagnosis not present

## 2019-02-25 DIAGNOSIS — I129 Hypertensive chronic kidney disease with stage 1 through stage 4 chronic kidney disease, or unspecified chronic kidney disease: Secondary | ICD-10-CM | POA: Diagnosis not present

## 2019-02-25 DIAGNOSIS — L02212 Cutaneous abscess of back [any part, except buttock]: Secondary | ICD-10-CM | POA: Diagnosis not present

## 2019-02-25 DIAGNOSIS — M48061 Spinal stenosis, lumbar region without neurogenic claudication: Secondary | ICD-10-CM | POA: Diagnosis not present

## 2019-02-25 DIAGNOSIS — Z9181 History of falling: Secondary | ICD-10-CM | POA: Diagnosis not present

## 2019-02-25 DIAGNOSIS — D631 Anemia in chronic kidney disease: Secondary | ICD-10-CM | POA: Diagnosis not present

## 2019-02-25 DIAGNOSIS — T8141XA Infection following a procedure, superficial incisional surgical site, initial encounter: Secondary | ICD-10-CM | POA: Diagnosis not present

## 2019-02-25 DIAGNOSIS — N183 Chronic kidney disease, stage 3 (moderate): Secondary | ICD-10-CM | POA: Diagnosis not present

## 2019-02-25 DIAGNOSIS — E1122 Type 2 diabetes mellitus with diabetic chronic kidney disease: Secondary | ICD-10-CM | POA: Diagnosis not present

## 2019-02-28 DIAGNOSIS — I129 Hypertensive chronic kidney disease with stage 1 through stage 4 chronic kidney disease, or unspecified chronic kidney disease: Secondary | ICD-10-CM | POA: Diagnosis not present

## 2019-02-28 DIAGNOSIS — N183 Chronic kidney disease, stage 3 (moderate): Secondary | ICD-10-CM | POA: Diagnosis not present

## 2019-02-28 DIAGNOSIS — M1990 Unspecified osteoarthritis, unspecified site: Secondary | ICD-10-CM | POA: Diagnosis not present

## 2019-02-28 DIAGNOSIS — D631 Anemia in chronic kidney disease: Secondary | ICD-10-CM | POA: Diagnosis not present

## 2019-02-28 DIAGNOSIS — Z981 Arthrodesis status: Secondary | ICD-10-CM | POA: Diagnosis not present

## 2019-02-28 DIAGNOSIS — E039 Hypothyroidism, unspecified: Secondary | ICD-10-CM | POA: Diagnosis not present

## 2019-02-28 DIAGNOSIS — Z792 Long term (current) use of antibiotics: Secondary | ICD-10-CM | POA: Diagnosis not present

## 2019-02-28 DIAGNOSIS — E1122 Type 2 diabetes mellitus with diabetic chronic kidney disease: Secondary | ICD-10-CM | POA: Diagnosis not present

## 2019-02-28 DIAGNOSIS — T8141XA Infection following a procedure, superficial incisional surgical site, initial encounter: Secondary | ICD-10-CM | POA: Diagnosis not present

## 2019-02-28 DIAGNOSIS — I2510 Atherosclerotic heart disease of native coronary artery without angina pectoris: Secondary | ICD-10-CM | POA: Diagnosis not present

## 2019-02-28 DIAGNOSIS — L02212 Cutaneous abscess of back [any part, except buttock]: Secondary | ICD-10-CM | POA: Diagnosis not present

## 2019-02-28 DIAGNOSIS — E785 Hyperlipidemia, unspecified: Secondary | ICD-10-CM | POA: Diagnosis not present

## 2019-02-28 DIAGNOSIS — K5730 Diverticulosis of large intestine without perforation or abscess without bleeding: Secondary | ICD-10-CM | POA: Diagnosis not present

## 2019-02-28 DIAGNOSIS — Z9181 History of falling: Secondary | ICD-10-CM | POA: Diagnosis not present

## 2019-02-28 DIAGNOSIS — M48061 Spinal stenosis, lumbar region without neurogenic claudication: Secondary | ICD-10-CM | POA: Diagnosis not present

## 2019-03-03 DIAGNOSIS — E785 Hyperlipidemia, unspecified: Secondary | ICD-10-CM | POA: Diagnosis not present

## 2019-03-03 DIAGNOSIS — K5730 Diverticulosis of large intestine without perforation or abscess without bleeding: Secondary | ICD-10-CM | POA: Diagnosis not present

## 2019-03-03 DIAGNOSIS — Z981 Arthrodesis status: Secondary | ICD-10-CM | POA: Diagnosis not present

## 2019-03-03 DIAGNOSIS — D631 Anemia in chronic kidney disease: Secondary | ICD-10-CM | POA: Diagnosis not present

## 2019-03-03 DIAGNOSIS — Z792 Long term (current) use of antibiotics: Secondary | ICD-10-CM | POA: Diagnosis not present

## 2019-03-03 DIAGNOSIS — Z9181 History of falling: Secondary | ICD-10-CM | POA: Diagnosis not present

## 2019-03-03 DIAGNOSIS — E039 Hypothyroidism, unspecified: Secondary | ICD-10-CM | POA: Diagnosis not present

## 2019-03-03 DIAGNOSIS — I129 Hypertensive chronic kidney disease with stage 1 through stage 4 chronic kidney disease, or unspecified chronic kidney disease: Secondary | ICD-10-CM | POA: Diagnosis not present

## 2019-03-03 DIAGNOSIS — I2510 Atherosclerotic heart disease of native coronary artery without angina pectoris: Secondary | ICD-10-CM | POA: Diagnosis not present

## 2019-03-03 DIAGNOSIS — M1990 Unspecified osteoarthritis, unspecified site: Secondary | ICD-10-CM | POA: Diagnosis not present

## 2019-03-03 DIAGNOSIS — N183 Chronic kidney disease, stage 3 (moderate): Secondary | ICD-10-CM | POA: Diagnosis not present

## 2019-03-03 DIAGNOSIS — L02212 Cutaneous abscess of back [any part, except buttock]: Secondary | ICD-10-CM | POA: Diagnosis not present

## 2019-03-03 DIAGNOSIS — M48061 Spinal stenosis, lumbar region without neurogenic claudication: Secondary | ICD-10-CM | POA: Diagnosis not present

## 2019-03-03 DIAGNOSIS — E1122 Type 2 diabetes mellitus with diabetic chronic kidney disease: Secondary | ICD-10-CM | POA: Diagnosis not present

## 2019-03-03 DIAGNOSIS — T8141XA Infection following a procedure, superficial incisional surgical site, initial encounter: Secondary | ICD-10-CM | POA: Diagnosis not present

## 2019-03-07 ENCOUNTER — Other Ambulatory Visit: Payer: Self-pay

## 2019-03-07 DIAGNOSIS — E785 Hyperlipidemia, unspecified: Secondary | ICD-10-CM | POA: Diagnosis not present

## 2019-03-07 DIAGNOSIS — I129 Hypertensive chronic kidney disease with stage 1 through stage 4 chronic kidney disease, or unspecified chronic kidney disease: Secondary | ICD-10-CM | POA: Diagnosis not present

## 2019-03-07 DIAGNOSIS — E1122 Type 2 diabetes mellitus with diabetic chronic kidney disease: Secondary | ICD-10-CM | POA: Diagnosis not present

## 2019-03-07 DIAGNOSIS — Z9181 History of falling: Secondary | ICD-10-CM | POA: Diagnosis not present

## 2019-03-07 DIAGNOSIS — T8141XA Infection following a procedure, superficial incisional surgical site, initial encounter: Secondary | ICD-10-CM | POA: Diagnosis not present

## 2019-03-07 DIAGNOSIS — Z981 Arthrodesis status: Secondary | ICD-10-CM | POA: Diagnosis not present

## 2019-03-07 DIAGNOSIS — I2510 Atherosclerotic heart disease of native coronary artery without angina pectoris: Secondary | ICD-10-CM | POA: Diagnosis not present

## 2019-03-07 DIAGNOSIS — Z792 Long term (current) use of antibiotics: Secondary | ICD-10-CM | POA: Diagnosis not present

## 2019-03-07 DIAGNOSIS — K5730 Diverticulosis of large intestine without perforation or abscess without bleeding: Secondary | ICD-10-CM | POA: Diagnosis not present

## 2019-03-07 DIAGNOSIS — L02212 Cutaneous abscess of back [any part, except buttock]: Secondary | ICD-10-CM | POA: Diagnosis not present

## 2019-03-07 DIAGNOSIS — E039 Hypothyroidism, unspecified: Secondary | ICD-10-CM | POA: Diagnosis not present

## 2019-03-07 DIAGNOSIS — M1990 Unspecified osteoarthritis, unspecified site: Secondary | ICD-10-CM | POA: Diagnosis not present

## 2019-03-07 DIAGNOSIS — D631 Anemia in chronic kidney disease: Secondary | ICD-10-CM | POA: Diagnosis not present

## 2019-03-07 DIAGNOSIS — R600 Localized edema: Secondary | ICD-10-CM

## 2019-03-07 DIAGNOSIS — M48061 Spinal stenosis, lumbar region without neurogenic claudication: Secondary | ICD-10-CM | POA: Diagnosis not present

## 2019-03-07 DIAGNOSIS — N183 Chronic kidney disease, stage 3 (moderate): Secondary | ICD-10-CM | POA: Diagnosis not present

## 2019-03-07 MED ORDER — FUROSEMIDE 40 MG PO TABS: 40.0000 mg | ORAL_TABLET | Freq: Two times a day (BID) | ORAL | 0 refills | Status: DC

## 2019-03-09 DIAGNOSIS — D649 Anemia, unspecified: Secondary | ICD-10-CM | POA: Diagnosis not present

## 2019-03-09 DIAGNOSIS — R809 Proteinuria, unspecified: Secondary | ICD-10-CM | POA: Diagnosis not present

## 2019-03-09 DIAGNOSIS — N2581 Secondary hyperparathyroidism of renal origin: Secondary | ICD-10-CM | POA: Diagnosis not present

## 2019-03-09 DIAGNOSIS — I129 Hypertensive chronic kidney disease with stage 1 through stage 4 chronic kidney disease, or unspecified chronic kidney disease: Secondary | ICD-10-CM | POA: Diagnosis not present

## 2019-03-09 DIAGNOSIS — N184 Chronic kidney disease, stage 4 (severe): Secondary | ICD-10-CM | POA: Diagnosis not present

## 2019-03-11 DIAGNOSIS — K5730 Diverticulosis of large intestine without perforation or abscess without bleeding: Secondary | ICD-10-CM | POA: Diagnosis not present

## 2019-03-11 DIAGNOSIS — M48061 Spinal stenosis, lumbar region without neurogenic claudication: Secondary | ICD-10-CM | POA: Diagnosis not present

## 2019-03-11 DIAGNOSIS — E039 Hypothyroidism, unspecified: Secondary | ICD-10-CM | POA: Diagnosis not present

## 2019-03-11 DIAGNOSIS — Z981 Arthrodesis status: Secondary | ICD-10-CM | POA: Diagnosis not present

## 2019-03-11 DIAGNOSIS — M1990 Unspecified osteoarthritis, unspecified site: Secondary | ICD-10-CM | POA: Diagnosis not present

## 2019-03-11 DIAGNOSIS — I129 Hypertensive chronic kidney disease with stage 1 through stage 4 chronic kidney disease, or unspecified chronic kidney disease: Secondary | ICD-10-CM | POA: Diagnosis not present

## 2019-03-11 DIAGNOSIS — I2510 Atherosclerotic heart disease of native coronary artery without angina pectoris: Secondary | ICD-10-CM | POA: Diagnosis not present

## 2019-03-11 DIAGNOSIS — N183 Chronic kidney disease, stage 3 (moderate): Secondary | ICD-10-CM | POA: Diagnosis not present

## 2019-03-11 DIAGNOSIS — D631 Anemia in chronic kidney disease: Secondary | ICD-10-CM | POA: Diagnosis not present

## 2019-03-11 DIAGNOSIS — L02212 Cutaneous abscess of back [any part, except buttock]: Secondary | ICD-10-CM | POA: Diagnosis not present

## 2019-03-11 DIAGNOSIS — E785 Hyperlipidemia, unspecified: Secondary | ICD-10-CM | POA: Diagnosis not present

## 2019-03-11 DIAGNOSIS — E1122 Type 2 diabetes mellitus with diabetic chronic kidney disease: Secondary | ICD-10-CM | POA: Diagnosis not present

## 2019-03-11 DIAGNOSIS — Z9181 History of falling: Secondary | ICD-10-CM | POA: Diagnosis not present

## 2019-03-11 DIAGNOSIS — Z792 Long term (current) use of antibiotics: Secondary | ICD-10-CM | POA: Diagnosis not present

## 2019-03-11 DIAGNOSIS — T8141XA Infection following a procedure, superficial incisional surgical site, initial encounter: Secondary | ICD-10-CM | POA: Diagnosis not present

## 2019-03-14 DIAGNOSIS — Z792 Long term (current) use of antibiotics: Secondary | ICD-10-CM | POA: Diagnosis not present

## 2019-03-14 DIAGNOSIS — L02212 Cutaneous abscess of back [any part, except buttock]: Secondary | ICD-10-CM | POA: Diagnosis not present

## 2019-03-14 DIAGNOSIS — I2510 Atherosclerotic heart disease of native coronary artery without angina pectoris: Secondary | ICD-10-CM | POA: Diagnosis not present

## 2019-03-14 DIAGNOSIS — T8141XA Infection following a procedure, superficial incisional surgical site, initial encounter: Secondary | ICD-10-CM | POA: Diagnosis not present

## 2019-03-14 DIAGNOSIS — Z9181 History of falling: Secondary | ICD-10-CM | POA: Diagnosis not present

## 2019-03-14 DIAGNOSIS — K5730 Diverticulosis of large intestine without perforation or abscess without bleeding: Secondary | ICD-10-CM | POA: Diagnosis not present

## 2019-03-14 DIAGNOSIS — Z981 Arthrodesis status: Secondary | ICD-10-CM | POA: Diagnosis not present

## 2019-03-14 DIAGNOSIS — D631 Anemia in chronic kidney disease: Secondary | ICD-10-CM | POA: Diagnosis not present

## 2019-03-14 DIAGNOSIS — E039 Hypothyroidism, unspecified: Secondary | ICD-10-CM | POA: Diagnosis not present

## 2019-03-14 DIAGNOSIS — M1990 Unspecified osteoarthritis, unspecified site: Secondary | ICD-10-CM | POA: Diagnosis not present

## 2019-03-14 DIAGNOSIS — E1122 Type 2 diabetes mellitus with diabetic chronic kidney disease: Secondary | ICD-10-CM | POA: Diagnosis not present

## 2019-03-14 DIAGNOSIS — N183 Chronic kidney disease, stage 3 (moderate): Secondary | ICD-10-CM | POA: Diagnosis not present

## 2019-03-14 DIAGNOSIS — E785 Hyperlipidemia, unspecified: Secondary | ICD-10-CM | POA: Diagnosis not present

## 2019-03-14 DIAGNOSIS — I129 Hypertensive chronic kidney disease with stage 1 through stage 4 chronic kidney disease, or unspecified chronic kidney disease: Secondary | ICD-10-CM | POA: Diagnosis not present

## 2019-03-14 DIAGNOSIS — M48061 Spinal stenosis, lumbar region without neurogenic claudication: Secondary | ICD-10-CM | POA: Diagnosis not present

## 2019-03-18 DIAGNOSIS — E039 Hypothyroidism, unspecified: Secondary | ICD-10-CM | POA: Diagnosis not present

## 2019-03-18 DIAGNOSIS — Z792 Long term (current) use of antibiotics: Secondary | ICD-10-CM | POA: Diagnosis not present

## 2019-03-18 DIAGNOSIS — N183 Chronic kidney disease, stage 3 (moderate): Secondary | ICD-10-CM | POA: Diagnosis not present

## 2019-03-18 DIAGNOSIS — I2510 Atherosclerotic heart disease of native coronary artery without angina pectoris: Secondary | ICD-10-CM | POA: Diagnosis not present

## 2019-03-18 DIAGNOSIS — D631 Anemia in chronic kidney disease: Secondary | ICD-10-CM | POA: Diagnosis not present

## 2019-03-18 DIAGNOSIS — Z981 Arthrodesis status: Secondary | ICD-10-CM | POA: Diagnosis not present

## 2019-03-18 DIAGNOSIS — M48061 Spinal stenosis, lumbar region without neurogenic claudication: Secondary | ICD-10-CM | POA: Diagnosis not present

## 2019-03-18 DIAGNOSIS — Z9181 History of falling: Secondary | ICD-10-CM | POA: Diagnosis not present

## 2019-03-18 DIAGNOSIS — M1990 Unspecified osteoarthritis, unspecified site: Secondary | ICD-10-CM | POA: Diagnosis not present

## 2019-03-18 DIAGNOSIS — I129 Hypertensive chronic kidney disease with stage 1 through stage 4 chronic kidney disease, or unspecified chronic kidney disease: Secondary | ICD-10-CM | POA: Diagnosis not present

## 2019-03-18 DIAGNOSIS — T8141XA Infection following a procedure, superficial incisional surgical site, initial encounter: Secondary | ICD-10-CM | POA: Diagnosis not present

## 2019-03-18 DIAGNOSIS — E785 Hyperlipidemia, unspecified: Secondary | ICD-10-CM | POA: Diagnosis not present

## 2019-03-18 DIAGNOSIS — E1122 Type 2 diabetes mellitus with diabetic chronic kidney disease: Secondary | ICD-10-CM | POA: Diagnosis not present

## 2019-03-18 DIAGNOSIS — L02212 Cutaneous abscess of back [any part, except buttock]: Secondary | ICD-10-CM | POA: Diagnosis not present

## 2019-03-18 DIAGNOSIS — K5730 Diverticulosis of large intestine without perforation or abscess without bleeding: Secondary | ICD-10-CM | POA: Diagnosis not present

## 2019-03-21 DIAGNOSIS — M1990 Unspecified osteoarthritis, unspecified site: Secondary | ICD-10-CM | POA: Diagnosis not present

## 2019-03-21 DIAGNOSIS — I2510 Atherosclerotic heart disease of native coronary artery without angina pectoris: Secondary | ICD-10-CM | POA: Diagnosis not present

## 2019-03-21 DIAGNOSIS — L02212 Cutaneous abscess of back [any part, except buttock]: Secondary | ICD-10-CM | POA: Diagnosis not present

## 2019-03-21 DIAGNOSIS — Z9181 History of falling: Secondary | ICD-10-CM | POA: Diagnosis not present

## 2019-03-21 DIAGNOSIS — T8141XA Infection following a procedure, superficial incisional surgical site, initial encounter: Secondary | ICD-10-CM | POA: Diagnosis not present

## 2019-03-21 DIAGNOSIS — N183 Chronic kidney disease, stage 3 (moderate): Secondary | ICD-10-CM | POA: Diagnosis not present

## 2019-03-21 DIAGNOSIS — Z792 Long term (current) use of antibiotics: Secondary | ICD-10-CM | POA: Diagnosis not present

## 2019-03-21 DIAGNOSIS — E1122 Type 2 diabetes mellitus with diabetic chronic kidney disease: Secondary | ICD-10-CM | POA: Diagnosis not present

## 2019-03-21 DIAGNOSIS — M48061 Spinal stenosis, lumbar region without neurogenic claudication: Secondary | ICD-10-CM | POA: Diagnosis not present

## 2019-03-21 DIAGNOSIS — E039 Hypothyroidism, unspecified: Secondary | ICD-10-CM | POA: Diagnosis not present

## 2019-03-21 DIAGNOSIS — E785 Hyperlipidemia, unspecified: Secondary | ICD-10-CM | POA: Diagnosis not present

## 2019-03-21 DIAGNOSIS — K5730 Diverticulosis of large intestine without perforation or abscess without bleeding: Secondary | ICD-10-CM | POA: Diagnosis not present

## 2019-03-21 DIAGNOSIS — D631 Anemia in chronic kidney disease: Secondary | ICD-10-CM | POA: Diagnosis not present

## 2019-03-21 DIAGNOSIS — Z981 Arthrodesis status: Secondary | ICD-10-CM | POA: Diagnosis not present

## 2019-03-21 DIAGNOSIS — I129 Hypertensive chronic kidney disease with stage 1 through stage 4 chronic kidney disease, or unspecified chronic kidney disease: Secondary | ICD-10-CM | POA: Diagnosis not present

## 2019-03-22 DIAGNOSIS — T8149XA Infection following a procedure, other surgical site, initial encounter: Secondary | ICD-10-CM | POA: Diagnosis not present

## 2019-03-24 ENCOUNTER — Other Ambulatory Visit (HOSPITAL_COMMUNITY): Payer: Self-pay | Admitting: *Deleted

## 2019-03-24 DIAGNOSIS — N183 Chronic kidney disease, stage 3 (moderate): Secondary | ICD-10-CM | POA: Diagnosis not present

## 2019-03-24 DIAGNOSIS — M48061 Spinal stenosis, lumbar region without neurogenic claudication: Secondary | ICD-10-CM | POA: Diagnosis not present

## 2019-03-24 DIAGNOSIS — T8141XA Infection following a procedure, superficial incisional surgical site, initial encounter: Secondary | ICD-10-CM | POA: Diagnosis not present

## 2019-03-24 DIAGNOSIS — E785 Hyperlipidemia, unspecified: Secondary | ICD-10-CM | POA: Diagnosis not present

## 2019-03-24 DIAGNOSIS — Z9181 History of falling: Secondary | ICD-10-CM | POA: Diagnosis not present

## 2019-03-24 DIAGNOSIS — Z981 Arthrodesis status: Secondary | ICD-10-CM | POA: Diagnosis not present

## 2019-03-24 DIAGNOSIS — I2510 Atherosclerotic heart disease of native coronary artery without angina pectoris: Secondary | ICD-10-CM | POA: Diagnosis not present

## 2019-03-24 DIAGNOSIS — Z792 Long term (current) use of antibiotics: Secondary | ICD-10-CM | POA: Diagnosis not present

## 2019-03-24 DIAGNOSIS — K5730 Diverticulosis of large intestine without perforation or abscess without bleeding: Secondary | ICD-10-CM | POA: Diagnosis not present

## 2019-03-24 DIAGNOSIS — I129 Hypertensive chronic kidney disease with stage 1 through stage 4 chronic kidney disease, or unspecified chronic kidney disease: Secondary | ICD-10-CM | POA: Diagnosis not present

## 2019-03-24 DIAGNOSIS — D631 Anemia in chronic kidney disease: Secondary | ICD-10-CM | POA: Diagnosis not present

## 2019-03-24 DIAGNOSIS — L02212 Cutaneous abscess of back [any part, except buttock]: Secondary | ICD-10-CM | POA: Diagnosis not present

## 2019-03-24 DIAGNOSIS — E039 Hypothyroidism, unspecified: Secondary | ICD-10-CM | POA: Diagnosis not present

## 2019-03-24 DIAGNOSIS — E1122 Type 2 diabetes mellitus with diabetic chronic kidney disease: Secondary | ICD-10-CM | POA: Diagnosis not present

## 2019-03-24 DIAGNOSIS — M1990 Unspecified osteoarthritis, unspecified site: Secondary | ICD-10-CM | POA: Diagnosis not present

## 2019-03-25 ENCOUNTER — Inpatient Hospital Stay (HOSPITAL_COMMUNITY)
Admission: RE | Admit: 2019-03-25 | Discharge: 2019-03-25 | Disposition: A | Discharge status: Home / Self Care | Payer: Medicare Other | Source: Ambulatory Visit | Attending: Internal Medicine | Admitting: Internal Medicine

## 2019-03-28 DIAGNOSIS — Z792 Long term (current) use of antibiotics: Secondary | ICD-10-CM | POA: Diagnosis not present

## 2019-03-28 DIAGNOSIS — I2510 Atherosclerotic heart disease of native coronary artery without angina pectoris: Secondary | ICD-10-CM | POA: Diagnosis not present

## 2019-03-28 DIAGNOSIS — E1122 Type 2 diabetes mellitus with diabetic chronic kidney disease: Secondary | ICD-10-CM | POA: Diagnosis not present

## 2019-03-28 DIAGNOSIS — Z981 Arthrodesis status: Secondary | ICD-10-CM | POA: Diagnosis not present

## 2019-03-28 DIAGNOSIS — N183 Chronic kidney disease, stage 3 (moderate): Secondary | ICD-10-CM | POA: Diagnosis not present

## 2019-03-28 DIAGNOSIS — T8141XA Infection following a procedure, superficial incisional surgical site, initial encounter: Secondary | ICD-10-CM | POA: Diagnosis not present

## 2019-03-28 DIAGNOSIS — M1990 Unspecified osteoarthritis, unspecified site: Secondary | ICD-10-CM | POA: Diagnosis not present

## 2019-03-28 DIAGNOSIS — M48061 Spinal stenosis, lumbar region without neurogenic claudication: Secondary | ICD-10-CM | POA: Diagnosis not present

## 2019-03-28 DIAGNOSIS — E039 Hypothyroidism, unspecified: Secondary | ICD-10-CM | POA: Diagnosis not present

## 2019-03-28 DIAGNOSIS — D631 Anemia in chronic kidney disease: Secondary | ICD-10-CM | POA: Diagnosis not present

## 2019-03-28 DIAGNOSIS — Z9181 History of falling: Secondary | ICD-10-CM | POA: Diagnosis not present

## 2019-03-28 DIAGNOSIS — I129 Hypertensive chronic kidney disease with stage 1 through stage 4 chronic kidney disease, or unspecified chronic kidney disease: Secondary | ICD-10-CM | POA: Diagnosis not present

## 2019-03-28 DIAGNOSIS — E785 Hyperlipidemia, unspecified: Secondary | ICD-10-CM | POA: Diagnosis not present

## 2019-03-28 DIAGNOSIS — K5730 Diverticulosis of large intestine without perforation or abscess without bleeding: Secondary | ICD-10-CM | POA: Diagnosis not present

## 2019-03-31 ENCOUNTER — Other Ambulatory Visit (HOSPITAL_COMMUNITY): Payer: Self-pay | Admitting: *Deleted

## 2019-03-31 DIAGNOSIS — Z792 Long term (current) use of antibiotics: Secondary | ICD-10-CM | POA: Diagnosis not present

## 2019-03-31 DIAGNOSIS — E785 Hyperlipidemia, unspecified: Secondary | ICD-10-CM | POA: Diagnosis not present

## 2019-03-31 DIAGNOSIS — T8141XA Infection following a procedure, superficial incisional surgical site, initial encounter: Secondary | ICD-10-CM | POA: Diagnosis not present

## 2019-03-31 DIAGNOSIS — D631 Anemia in chronic kidney disease: Secondary | ICD-10-CM | POA: Diagnosis not present

## 2019-03-31 DIAGNOSIS — E039 Hypothyroidism, unspecified: Secondary | ICD-10-CM | POA: Diagnosis not present

## 2019-03-31 DIAGNOSIS — E1122 Type 2 diabetes mellitus with diabetic chronic kidney disease: Secondary | ICD-10-CM | POA: Diagnosis not present

## 2019-03-31 DIAGNOSIS — I129 Hypertensive chronic kidney disease with stage 1 through stage 4 chronic kidney disease, or unspecified chronic kidney disease: Secondary | ICD-10-CM | POA: Diagnosis not present

## 2019-03-31 DIAGNOSIS — Z981 Arthrodesis status: Secondary | ICD-10-CM | POA: Diagnosis not present

## 2019-03-31 DIAGNOSIS — M1990 Unspecified osteoarthritis, unspecified site: Secondary | ICD-10-CM | POA: Diagnosis not present

## 2019-03-31 DIAGNOSIS — M48061 Spinal stenosis, lumbar region without neurogenic claudication: Secondary | ICD-10-CM | POA: Diagnosis not present

## 2019-03-31 DIAGNOSIS — I2510 Atherosclerotic heart disease of native coronary artery without angina pectoris: Secondary | ICD-10-CM | POA: Diagnosis not present

## 2019-03-31 DIAGNOSIS — K5730 Diverticulosis of large intestine without perforation or abscess without bleeding: Secondary | ICD-10-CM | POA: Diagnosis not present

## 2019-03-31 DIAGNOSIS — Z9181 History of falling: Secondary | ICD-10-CM | POA: Diagnosis not present

## 2019-03-31 DIAGNOSIS — N183 Chronic kidney disease, stage 3 (moderate): Secondary | ICD-10-CM | POA: Diagnosis not present

## 2019-04-01 ENCOUNTER — Ambulatory Visit (HOSPITAL_COMMUNITY)
Admission: RE | Admit: 2019-04-01 | Discharge: 2019-04-01 | Disposition: A | Discharge status: Home / Self Care | Payer: Medicare Other | Source: Ambulatory Visit | Attending: Internal Medicine | Admitting: Internal Medicine

## 2019-04-01 ENCOUNTER — Other Ambulatory Visit: Payer: Self-pay

## 2019-04-01 VITALS — BP 212/70 | HR 67 | Temp 97.3°F | Resp 20 | Ht 62.0 in | Wt 200.0 lb

## 2019-04-01 DIAGNOSIS — N184 Chronic kidney disease, stage 4 (severe): Secondary | ICD-10-CM

## 2019-04-01 MED ORDER — SODIUM CHLORIDE 0.9 % IV SOLN
510.0000 mg | Freq: Once | INTRAVENOUS | Status: DC
  Filled 2019-04-01: qty 17

## 2019-04-01 MED ORDER — EPOETIN ALFA-EPBX 10000 UNIT/ML IJ SOLN
20000.0000 [IU] | INTRAMUSCULAR | Status: DC
  Filled 2019-04-01: qty 2

## 2019-04-01 NOTE — Progress Notes (Addendum)
Left message with Taylor Tyler about BP 212/70. Patient did not take BP meds this Am.  Taylor Tyler returned call and stated per Dr. Johnney Ou we willl reschedule patient for next week and tell patient to be sure she take her BPmeds prior to visit.

## 2019-04-01 NOTE — Discharge Instructions (Signed)
  Epoetin Alfa injection What is this medicine? EPOETIN ALFA (e POE e tin AL fa) helps your body make more red blood cells. This medicine is used to treat anemia caused by chronic kidney disease, cancer chemotherapy, or HIV-therapy. It may also be used before surgery if you have anemia. This medicine may be used for other purposes; ask your health care provider or pharmacist if you have questions. COMMON BRAND NAME(S): Epogen, Procrit, Retacrit What should I tell my health care provider before I take this medicine? They need to know if you have any of these conditions:  cancer  heart disease  high blood pressure  history of blood clots  history of stroke  low levels of folate, iron, or vitamin B12 in the blood  seizures  an unusual or allergic reaction to erythropoietin, albumin, benzyl alcohol, hamster proteins, other medicines, foods, dyes, or preservatives  pregnant or trying to get pregnant  breast-feeding How should I use this medicine? This medicine is for injection into a vein or under the skin. It is usually given by a health care professional in a hospital or clinic setting. If you get this medicine at home, you will be taught how to prepare and give this medicine. Use exactly as directed. Take your medicine at regular intervals. Do not take your medicine more often than directed. It is important that you put your used needles and syringes in a special sharps container. Do not put them in a trash can. If you do not have a sharps container, call your pharmacist or healthcare provider to get one. A special MedGuide will be given to you by the pharmacist with each prescription and refill. Be sure to read this information carefully each time. Talk to your pediatrician regarding the use of this medicine in children. While this drug may be prescribed for selected conditions, precautions do apply. Overdosage: If you think you have taken too much of this medicine contact a  poison control center or emergency room at once. NOTE: This medicine is only for you. Do not share this medicine with others. What if I miss a dose? If you miss a dose, take it as soon as you can. If it is almost time for your next dose, take only that dose. Do not take double or extra doses. What may interact with this medicine? Interactions have not been studied. This list may not describe all possible interactions. Give your health care provider a list of all the medicines, herbs, non-prescription drugs, or dietary supplements you use. Also tell them if you smoke, drink alcohol, or use illegal drugs. Some items may interact with your medicine. What should I watch for while using this medicine? Your condition will be monitored carefully while you are receiving this medicine. You may need blood work done while you are taking this medicine. This medicine may cause a decrease in vitamin B6. You should make sure that you get enough vitamin B6 while you are taking this medicine. Discuss the foods you eat and the vitamins you take with your health care professional. What side effects may I notice from receiving this medicine? Side effects that you should report to your doctor or health care professional as soon as possible:  allergic reactions like skin rash, itching or hives, swelling of the face, lips, or tongue  seizures  signs and symptoms of a blood clot such as breathing problems; changes in vision; chest pain; severe, sudden headache; pain, swelling, warmth in the leg; trouble speaking; sudden   numbness or weakness of the face, arm or leg  signs and symptoms of a stroke like changes in vision; confusion; trouble speaking or understanding; severe headaches; sudden numbness or weakness of the face, arm or leg; trouble walking; dizziness; loss of balance or coordination Side effects that usually do not require medical attention (report to your doctor or health care professional if they continue  or are bothersome):  chills  cough  dizziness  fever  headaches  joint pain  muscle cramps  muscle pain  nausea, vomiting  pain, redness, or irritation at site where injected This list may not describe all possible side effects. Call your doctor for medical advice about side effects. You may report side effects to FDA at 1-800-FDA-1088. Where should I keep my medicine? Keep out of the reach of children. Store in a refrigerator between 2 and 8 degrees C (36 and 46 degrees F). Do not freeze or shake. Throw away any unused portion if using a single-dose vial. Multi-dose vials can be kept in the refrigerator for up to 21 days after the initial dose. Throw away unused medicine. NOTE: This sheet is a summary. It may not cover all possible information. If you have questions about this medicine, talk to your doctor, pharmacist, or health care provider.  2020 Elsevier/Gold Standard (2017-02-27 08:35:19) Ferumoxytol injection What is this medicine? FERUMOXYTOL is an iron complex. Iron is used to make healthy red blood cells, which carry oxygen and nutrients throughout the body. This medicine is used to treat iron deficiency anemia. This medicine may be used for other purposes; ask your health care provider or pharmacist if you have questions. COMMON BRAND NAME(S): Feraheme What should I tell my health care provider before I take this medicine? They need to know if you have any of these conditions:  anemia not caused by low iron levels  high levels of iron in the blood  magnetic resonance imaging (MRI) test scheduled  an unusual or allergic reaction to iron, other medicines, foods, dyes, or preservatives  pregnant or trying to get pregnant  breast-feeding How should I use this medicine? This medicine is for injection into a vein. It is given by a health care professional in a hospital or clinic setting. Talk to your pediatrician regarding the use of this medicine in children.  Special care may be needed. Overdosage: If you think you have taken too much of this medicine contact a poison control center or emergency room at once. NOTE: This medicine is only for you. Do not share this medicine with others. What if I miss a dose? It is important not to miss your dose. Call your doctor or health care professional if you are unable to keep an appointment. What may interact with this medicine? This medicine may interact with the following medications:  other iron products This list may not describe all possible interactions. Give your health care provider a list of all the medicines, herbs, non-prescription drugs, or dietary supplements you use. Also tell them if you smoke, drink alcohol, or use illegal drugs. Some items may interact with your medicine. What should I watch for while using this medicine? Visit your doctor or healthcare professional regularly. Tell your doctor or healthcare professional if your symptoms do not start to get better or if they get worse. You may need blood work done while you are taking this medicine. You may need to follow a special diet. Talk to your doctor. Foods that contain iron include: whole grains/cereals, dried fruits,   beans, or peas, leafy green vegetables, and organ meats (liver, kidney). What side effects may I notice from receiving this medicine? Side effects that you should report to your doctor or health care professional as soon as possible:  allergic reactions like skin rash, itching or hives, swelling of the face, lips, or tongue  breathing problems  changes in blood pressure  feeling faint or lightheaded, falls  fever or chills  flushing, sweating, or hot feelings  swelling of the ankles or feet Side effects that usually do not require medical attention (report to your doctor or health care professional if they continue or are bothersome):  diarrhea  headache  nausea, vomiting  stomach pain This list may not  describe all possible side effects. Call your doctor for medical advice about side effects. You may report side effects to FDA at 1-800-FDA-1088. Where should I keep my medicine? This drug is given in a hospital or clinic and will not be stored at home. NOTE: This sheet is a summary. It may not cover all possible information. If you have questions about this medicine, talk to your doctor, pharmacist, or health care provider.  2020 Elsevier/Gold Standard (2016-09-08 20:21:10)  

## 2019-04-04 DIAGNOSIS — Z981 Arthrodesis status: Secondary | ICD-10-CM | POA: Diagnosis not present

## 2019-04-04 DIAGNOSIS — E785 Hyperlipidemia, unspecified: Secondary | ICD-10-CM | POA: Diagnosis not present

## 2019-04-04 DIAGNOSIS — N183 Chronic kidney disease, stage 3 (moderate): Secondary | ICD-10-CM | POA: Diagnosis not present

## 2019-04-04 DIAGNOSIS — I129 Hypertensive chronic kidney disease with stage 1 through stage 4 chronic kidney disease, or unspecified chronic kidney disease: Secondary | ICD-10-CM | POA: Diagnosis not present

## 2019-04-04 DIAGNOSIS — Z792 Long term (current) use of antibiotics: Secondary | ICD-10-CM | POA: Diagnosis not present

## 2019-04-04 DIAGNOSIS — K5730 Diverticulosis of large intestine without perforation or abscess without bleeding: Secondary | ICD-10-CM | POA: Diagnosis not present

## 2019-04-04 DIAGNOSIS — Z9181 History of falling: Secondary | ICD-10-CM | POA: Diagnosis not present

## 2019-04-04 DIAGNOSIS — T8141XA Infection following a procedure, superficial incisional surgical site, initial encounter: Secondary | ICD-10-CM | POA: Diagnosis not present

## 2019-04-04 DIAGNOSIS — D631 Anemia in chronic kidney disease: Secondary | ICD-10-CM | POA: Diagnosis not present

## 2019-04-04 DIAGNOSIS — M48061 Spinal stenosis, lumbar region without neurogenic claudication: Secondary | ICD-10-CM | POA: Diagnosis not present

## 2019-04-04 DIAGNOSIS — M1990 Unspecified osteoarthritis, unspecified site: Secondary | ICD-10-CM | POA: Diagnosis not present

## 2019-04-04 DIAGNOSIS — I2510 Atherosclerotic heart disease of native coronary artery without angina pectoris: Secondary | ICD-10-CM | POA: Diagnosis not present

## 2019-04-04 DIAGNOSIS — E039 Hypothyroidism, unspecified: Secondary | ICD-10-CM | POA: Diagnosis not present

## 2019-04-04 DIAGNOSIS — E1122 Type 2 diabetes mellitus with diabetic chronic kidney disease: Secondary | ICD-10-CM | POA: Diagnosis not present

## 2019-04-05 DIAGNOSIS — D649 Anemia, unspecified: Secondary | ICD-10-CM | POA: Diagnosis not present

## 2019-04-05 DIAGNOSIS — R809 Proteinuria, unspecified: Secondary | ICD-10-CM | POA: Diagnosis not present

## 2019-04-05 DIAGNOSIS — N2581 Secondary hyperparathyroidism of renal origin: Secondary | ICD-10-CM | POA: Diagnosis not present

## 2019-04-05 DIAGNOSIS — I129 Hypertensive chronic kidney disease with stage 1 through stage 4 chronic kidney disease, or unspecified chronic kidney disease: Secondary | ICD-10-CM | POA: Diagnosis not present

## 2019-04-05 DIAGNOSIS — N184 Chronic kidney disease, stage 4 (severe): Secondary | ICD-10-CM | POA: Diagnosis not present

## 2019-04-06 ENCOUNTER — Other Ambulatory Visit: Payer: Self-pay

## 2019-04-06 ENCOUNTER — Ambulatory Visit (HOSPITAL_COMMUNITY)
Admission: RE | Admit: 2019-04-06 | Discharge: 2019-04-06 | Disposition: A | Discharge status: Home / Self Care | Payer: Medicare Other | Source: Ambulatory Visit | Attending: Internal Medicine | Admitting: Internal Medicine

## 2019-04-06 VITALS — BP 134/45 | HR 59 | Temp 97.5°F | Resp 20 | Ht 62.0 in | Wt 200.0 lb

## 2019-04-06 DIAGNOSIS — N184 Chronic kidney disease, stage 4 (severe): Secondary | ICD-10-CM | POA: Insufficient documentation

## 2019-04-06 MED ORDER — EPOETIN ALFA-EPBX 10000 UNIT/ML IJ SOLN
20000.0000 [IU] | INTRAMUSCULAR | Status: DC
  Administered 2019-04-06: 20000 [IU] via SUBCUTANEOUS
  Filled 2019-04-06: qty 2

## 2019-04-06 MED ORDER — SODIUM CHLORIDE 0.9 % IV SOLN
510.0000 mg | Freq: Once | INTRAVENOUS | Status: AC
  Administered 2019-04-06: 510 mg via INTRAVENOUS
  Filled 2019-04-06: qty 17

## 2019-04-06 NOTE — Progress Notes (Signed)
Pt here for Retacrit and Feraheme this morning.  HGB 7.1 via hemocue.  Pt states that she has no obvious bleeding, chest pain or shortness of breath.  She is just tired.  Office called at [facility] and they were to put in a red flag notification to [employer].  Office called again at 0900 due to no return call, and I was able to speak with Stacy.  She was able to notify Dr Johnney Ou and we were instructed to proceed with injection.  Pt instructed to call for any new symptoms, shortness of breath, chest pain or bleeding.

## 2019-04-06 NOTE — Discharge Instructions (Signed)
  Epoetin Alfa injection What is this medicine? EPOETIN ALFA (e POE e tin AL fa) helps your body make more red blood cells. This medicine is used to treat anemia caused by chronic kidney disease, cancer chemotherapy, or HIV-therapy. It may also be used before surgery if you have anemia. This medicine may be used for other purposes; ask your health care provider or pharmacist if you have questions. COMMON BRAND NAME(S): Epogen, Procrit, Retacrit What should I tell my health care provider before I take this medicine? They need to know if you have any of these conditions:  cancer  heart disease  high blood pressure  history of blood clots  history of stroke  low levels of folate, iron, or vitamin B12 in the blood  seizures  an unusual or allergic reaction to erythropoietin, albumin, benzyl alcohol, hamster proteins, other medicines, foods, dyes, or preservatives  pregnant or trying to get pregnant  breast-feeding How should I use this medicine? This medicine is for injection into a vein or under the skin. It is usually given by a health care professional in a hospital or clinic setting. If you get this medicine at home, you will be taught how to prepare and give this medicine. Use exactly as directed. Take your medicine at regular intervals. Do not take your medicine more often than directed. It is important that you put your used needles and syringes in a special sharps container. Do not put them in a trash can. If you do not have a sharps container, call your pharmacist or healthcare provider to get one. A special MedGuide will be given to you by the pharmacist with each prescription and refill. Be sure to read this information carefully each time. Talk to your pediatrician regarding the use of this medicine in children. While this drug may be prescribed for selected conditions, precautions do apply. Overdosage: If you think you have taken too much of this medicine contact a  poison control center or emergency room at once. NOTE: This medicine is only for you. Do not share this medicine with others. What if I miss a dose? If you miss a dose, take it as soon as you can. If it is almost time for your next dose, take only that dose. Do not take double or extra doses. What may interact with this medicine? Interactions have not been studied. This list may not describe all possible interactions. Give your health care provider a list of all the medicines, herbs, non-prescription drugs, or dietary supplements you use. Also tell them if you smoke, drink alcohol, or use illegal drugs. Some items may interact with your medicine. What should I watch for while using this medicine? Your condition will be monitored carefully while you are receiving this medicine. You may need blood work done while you are taking this medicine. This medicine may cause a decrease in vitamin B6. You should make sure that you get enough vitamin B6 while you are taking this medicine. Discuss the foods you eat and the vitamins you take with your health care professional. What side effects may I notice from receiving this medicine? Side effects that you should report to your doctor or health care professional as soon as possible:  allergic reactions like skin rash, itching or hives, swelling of the face, lips, or tongue  seizures  signs and symptoms of a blood clot such as breathing problems; changes in vision; chest pain; severe, sudden headache; pain, swelling, warmth in the leg; trouble speaking; sudden   numbness or weakness of the face, arm or leg  signs and symptoms of a stroke like changes in vision; confusion; trouble speaking or understanding; severe headaches; sudden numbness or weakness of the face, arm or leg; trouble walking; dizziness; loss of balance or coordination Side effects that usually do not require medical attention (report to your doctor or health care professional if they continue  or are bothersome):  chills  cough  dizziness  fever  headaches  joint pain  muscle cramps  muscle pain  nausea, vomiting  pain, redness, or irritation at site where injected This list may not describe all possible side effects. Call your doctor for medical advice about side effects. You may report side effects to FDA at 1-800-FDA-1088. Where should I keep my medicine? Keep out of the reach of children. Store in a refrigerator between 2 and 8 degrees C (36 and 46 degrees F). Do not freeze or shake. Throw away any unused portion if using a single-dose vial. Multi-dose vials can be kept in the refrigerator for up to 21 days after the initial dose. Throw away unused medicine. NOTE: This sheet is a summary. It may not cover all possible information. If you have questions about this medicine, talk to your doctor, pharmacist, or health care provider.  2020 Elsevier/Gold Standard (2017-02-27 08:35:19) Ferumoxytol injection What is this medicine? FERUMOXYTOL is an iron complex. Iron is used to make healthy red blood cells, which carry oxygen and nutrients throughout the body. This medicine is used to treat iron deficiency anemia. This medicine may be used for other purposes; ask your health care provider or pharmacist if you have questions. COMMON BRAND NAME(S): Feraheme What should I tell my health care provider before I take this medicine? They need to know if you have any of these conditions:  anemia not caused by low iron levels  high levels of iron in the blood  magnetic resonance imaging (MRI) test scheduled  an unusual or allergic reaction to iron, other medicines, foods, dyes, or preservatives  pregnant or trying to get pregnant  breast-feeding How should I use this medicine? This medicine is for injection into a vein. It is given by a health care professional in a hospital or clinic setting. Talk to your pediatrician regarding the use of this medicine in children.  Special care may be needed. Overdosage: If you think you have taken too much of this medicine contact a poison control center or emergency room at once. NOTE: This medicine is only for you. Do not share this medicine with others. What if I miss a dose? It is important not to miss your dose. Call your doctor or health care professional if you are unable to keep an appointment. What may interact with this medicine? This medicine may interact with the following medications:  other iron products This list may not describe all possible interactions. Give your health care provider a list of all the medicines, herbs, non-prescription drugs, or dietary supplements you use. Also tell them if you smoke, drink alcohol, or use illegal drugs. Some items may interact with your medicine. What should I watch for while using this medicine? Visit your doctor or healthcare professional regularly. Tell your doctor or healthcare professional if your symptoms do not start to get better or if they get worse. You may need blood work done while you are taking this medicine. You may need to follow a special diet. Talk to your doctor. Foods that contain iron include: whole grains/cereals, dried fruits,   beans, or peas, leafy green vegetables, and organ meats (liver, kidney). What side effects may I notice from receiving this medicine? Side effects that you should report to your doctor or health care professional as soon as possible:  allergic reactions like skin rash, itching or hives, swelling of the face, lips, or tongue  breathing problems  changes in blood pressure  feeling faint or lightheaded, falls  fever or chills  flushing, sweating, or hot feelings  swelling of the ankles or feet Side effects that usually do not require medical attention (report to your doctor or health care professional if they continue or are bothersome):  diarrhea  headache  nausea, vomiting  stomach pain This list may not  describe all possible side effects. Call your doctor for medical advice about side effects. You may report side effects to FDA at 1-800-FDA-1088. Where should I keep my medicine? This drug is given in a hospital or clinic and will not be stored at home. NOTE: This sheet is a summary. It may not cover all possible information. If you have questions about this medicine, talk to your doctor, pharmacist, or health care provider.  2020 Elsevier/Gold Standard (2016-09-08 20:21:10)  

## 2019-04-07 DIAGNOSIS — E039 Hypothyroidism, unspecified: Secondary | ICD-10-CM | POA: Diagnosis not present

## 2019-04-07 DIAGNOSIS — E785 Hyperlipidemia, unspecified: Secondary | ICD-10-CM | POA: Diagnosis not present

## 2019-04-07 DIAGNOSIS — T8141XA Infection following a procedure, superficial incisional surgical site, initial encounter: Secondary | ICD-10-CM | POA: Diagnosis not present

## 2019-04-07 DIAGNOSIS — K5730 Diverticulosis of large intestine without perforation or abscess without bleeding: Secondary | ICD-10-CM | POA: Diagnosis not present

## 2019-04-07 DIAGNOSIS — Z9181 History of falling: Secondary | ICD-10-CM | POA: Diagnosis not present

## 2019-04-07 DIAGNOSIS — D631 Anemia in chronic kidney disease: Secondary | ICD-10-CM | POA: Diagnosis not present

## 2019-04-07 DIAGNOSIS — E1122 Type 2 diabetes mellitus with diabetic chronic kidney disease: Secondary | ICD-10-CM | POA: Diagnosis not present

## 2019-04-07 DIAGNOSIS — N183 Chronic kidney disease, stage 3 (moderate): Secondary | ICD-10-CM | POA: Diagnosis not present

## 2019-04-07 DIAGNOSIS — M1990 Unspecified osteoarthritis, unspecified site: Secondary | ICD-10-CM | POA: Diagnosis not present

## 2019-04-07 DIAGNOSIS — I129 Hypertensive chronic kidney disease with stage 1 through stage 4 chronic kidney disease, or unspecified chronic kidney disease: Secondary | ICD-10-CM | POA: Diagnosis not present

## 2019-04-07 DIAGNOSIS — M48061 Spinal stenosis, lumbar region without neurogenic claudication: Secondary | ICD-10-CM | POA: Diagnosis not present

## 2019-04-07 DIAGNOSIS — I2510 Atherosclerotic heart disease of native coronary artery without angina pectoris: Secondary | ICD-10-CM | POA: Diagnosis not present

## 2019-04-07 DIAGNOSIS — Z792 Long term (current) use of antibiotics: Secondary | ICD-10-CM | POA: Diagnosis not present

## 2019-04-07 DIAGNOSIS — Z981 Arthrodesis status: Secondary | ICD-10-CM | POA: Diagnosis not present

## 2019-04-07 LAB — POCT HEMOGLOBIN-HEMACUE: Hemoglobin: 7.1 g/dL — ABNORMAL LOW (ref 12.0–15.0)

## 2019-04-08 ENCOUNTER — Encounter (HOSPITAL_COMMUNITY): Payer: Medicare Other

## 2019-04-12 DIAGNOSIS — N183 Chronic kidney disease, stage 3 (moderate): Secondary | ICD-10-CM | POA: Diagnosis not present

## 2019-04-12 DIAGNOSIS — Z9181 History of falling: Secondary | ICD-10-CM | POA: Diagnosis not present

## 2019-04-12 DIAGNOSIS — D631 Anemia in chronic kidney disease: Secondary | ICD-10-CM | POA: Diagnosis not present

## 2019-04-12 DIAGNOSIS — I2510 Atherosclerotic heart disease of native coronary artery without angina pectoris: Secondary | ICD-10-CM | POA: Diagnosis not present

## 2019-04-12 DIAGNOSIS — E039 Hypothyroidism, unspecified: Secondary | ICD-10-CM | POA: Diagnosis not present

## 2019-04-12 DIAGNOSIS — Z792 Long term (current) use of antibiotics: Secondary | ICD-10-CM | POA: Diagnosis not present

## 2019-04-12 DIAGNOSIS — M1990 Unspecified osteoarthritis, unspecified site: Secondary | ICD-10-CM | POA: Diagnosis not present

## 2019-04-12 DIAGNOSIS — M48061 Spinal stenosis, lumbar region without neurogenic claudication: Secondary | ICD-10-CM | POA: Diagnosis not present

## 2019-04-12 DIAGNOSIS — T8141XA Infection following a procedure, superficial incisional surgical site, initial encounter: Secondary | ICD-10-CM | POA: Diagnosis not present

## 2019-04-12 DIAGNOSIS — E785 Hyperlipidemia, unspecified: Secondary | ICD-10-CM | POA: Diagnosis not present

## 2019-04-12 DIAGNOSIS — Z981 Arthrodesis status: Secondary | ICD-10-CM | POA: Diagnosis not present

## 2019-04-12 DIAGNOSIS — I129 Hypertensive chronic kidney disease with stage 1 through stage 4 chronic kidney disease, or unspecified chronic kidney disease: Secondary | ICD-10-CM | POA: Diagnosis not present

## 2019-04-12 DIAGNOSIS — K5730 Diverticulosis of large intestine without perforation or abscess without bleeding: Secondary | ICD-10-CM | POA: Diagnosis not present

## 2019-04-12 DIAGNOSIS — E1122 Type 2 diabetes mellitus with diabetic chronic kidney disease: Secondary | ICD-10-CM | POA: Diagnosis not present

## 2019-04-15 DIAGNOSIS — N183 Chronic kidney disease, stage 3 (moderate): Secondary | ICD-10-CM | POA: Diagnosis not present

## 2019-04-15 DIAGNOSIS — K5730 Diverticulosis of large intestine without perforation or abscess without bleeding: Secondary | ICD-10-CM | POA: Diagnosis not present

## 2019-04-15 DIAGNOSIS — T8141XA Infection following a procedure, superficial incisional surgical site, initial encounter: Secondary | ICD-10-CM | POA: Diagnosis not present

## 2019-04-15 DIAGNOSIS — E039 Hypothyroidism, unspecified: Secondary | ICD-10-CM | POA: Diagnosis not present

## 2019-04-15 DIAGNOSIS — I2510 Atherosclerotic heart disease of native coronary artery without angina pectoris: Secondary | ICD-10-CM | POA: Diagnosis not present

## 2019-04-15 DIAGNOSIS — Z792 Long term (current) use of antibiotics: Secondary | ICD-10-CM | POA: Diagnosis not present

## 2019-04-15 DIAGNOSIS — Z9181 History of falling: Secondary | ICD-10-CM | POA: Diagnosis not present

## 2019-04-15 DIAGNOSIS — M48061 Spinal stenosis, lumbar region without neurogenic claudication: Secondary | ICD-10-CM | POA: Diagnosis not present

## 2019-04-15 DIAGNOSIS — E1122 Type 2 diabetes mellitus with diabetic chronic kidney disease: Secondary | ICD-10-CM | POA: Diagnosis not present

## 2019-04-15 DIAGNOSIS — I129 Hypertensive chronic kidney disease with stage 1 through stage 4 chronic kidney disease, or unspecified chronic kidney disease: Secondary | ICD-10-CM | POA: Diagnosis not present

## 2019-04-15 DIAGNOSIS — E785 Hyperlipidemia, unspecified: Secondary | ICD-10-CM | POA: Diagnosis not present

## 2019-04-15 DIAGNOSIS — M1990 Unspecified osteoarthritis, unspecified site: Secondary | ICD-10-CM | POA: Diagnosis not present

## 2019-04-15 DIAGNOSIS — Z981 Arthrodesis status: Secondary | ICD-10-CM | POA: Diagnosis not present

## 2019-04-15 DIAGNOSIS — D631 Anemia in chronic kidney disease: Secondary | ICD-10-CM | POA: Diagnosis not present

## 2019-04-18 DIAGNOSIS — E1122 Type 2 diabetes mellitus with diabetic chronic kidney disease: Secondary | ICD-10-CM | POA: Diagnosis not present

## 2019-04-18 DIAGNOSIS — D631 Anemia in chronic kidney disease: Secondary | ICD-10-CM | POA: Diagnosis not present

## 2019-04-18 DIAGNOSIS — N183 Chronic kidney disease, stage 3 (moderate): Secondary | ICD-10-CM | POA: Diagnosis not present

## 2019-04-18 DIAGNOSIS — E785 Hyperlipidemia, unspecified: Secondary | ICD-10-CM | POA: Diagnosis not present

## 2019-04-18 DIAGNOSIS — M1990 Unspecified osteoarthritis, unspecified site: Secondary | ICD-10-CM | POA: Diagnosis not present

## 2019-04-18 DIAGNOSIS — K5730 Diverticulosis of large intestine without perforation or abscess without bleeding: Secondary | ICD-10-CM | POA: Diagnosis not present

## 2019-04-18 DIAGNOSIS — I2510 Atherosclerotic heart disease of native coronary artery without angina pectoris: Secondary | ICD-10-CM | POA: Diagnosis not present

## 2019-04-18 DIAGNOSIS — E039 Hypothyroidism, unspecified: Secondary | ICD-10-CM | POA: Diagnosis not present

## 2019-04-18 DIAGNOSIS — Z792 Long term (current) use of antibiotics: Secondary | ICD-10-CM | POA: Diagnosis not present

## 2019-04-18 DIAGNOSIS — Z981 Arthrodesis status: Secondary | ICD-10-CM | POA: Diagnosis not present

## 2019-04-18 DIAGNOSIS — I129 Hypertensive chronic kidney disease with stage 1 through stage 4 chronic kidney disease, or unspecified chronic kidney disease: Secondary | ICD-10-CM | POA: Diagnosis not present

## 2019-04-18 DIAGNOSIS — T8141XA Infection following a procedure, superficial incisional surgical site, initial encounter: Secondary | ICD-10-CM | POA: Diagnosis not present

## 2019-04-18 DIAGNOSIS — M48061 Spinal stenosis, lumbar region without neurogenic claudication: Secondary | ICD-10-CM | POA: Diagnosis not present

## 2019-04-18 DIAGNOSIS — Z9181 History of falling: Secondary | ICD-10-CM | POA: Diagnosis not present

## 2019-04-20 ENCOUNTER — Other Ambulatory Visit: Payer: Self-pay

## 2019-04-20 ENCOUNTER — Ambulatory Visit (HOSPITAL_COMMUNITY)
Admission: RE | Admit: 2019-04-20 | Discharge: 2019-04-20 | Disposition: A | Discharge status: Home / Self Care | Payer: Medicare Other | Source: Ambulatory Visit | Attending: Internal Medicine | Admitting: Internal Medicine

## 2019-04-20 VITALS — BP 147/59 | HR 55 | Temp 97.2°F | Resp 20

## 2019-04-20 DIAGNOSIS — N184 Chronic kidney disease, stage 4 (severe): Secondary | ICD-10-CM

## 2019-04-20 LAB — POCT HEMOGLOBIN-HEMACUE: Hemoglobin: 8.1 g/dL — ABNORMAL LOW (ref 12.0–15.0)

## 2019-04-20 MED ORDER — EPOETIN ALFA-EPBX 10000 UNIT/ML IJ SOLN
20000.0000 [IU] | INTRAMUSCULAR | Status: DC
  Administered 2019-04-20: 20000 [IU] via SUBCUTANEOUS
  Filled 2019-04-20: qty 2

## 2019-04-21 DIAGNOSIS — I129 Hypertensive chronic kidney disease with stage 1 through stage 4 chronic kidney disease, or unspecified chronic kidney disease: Secondary | ICD-10-CM | POA: Diagnosis not present

## 2019-04-21 DIAGNOSIS — N183 Chronic kidney disease, stage 3 (moderate): Secondary | ICD-10-CM | POA: Diagnosis not present

## 2019-04-21 DIAGNOSIS — M1990 Unspecified osteoarthritis, unspecified site: Secondary | ICD-10-CM | POA: Diagnosis not present

## 2019-04-21 DIAGNOSIS — E1122 Type 2 diabetes mellitus with diabetic chronic kidney disease: Secondary | ICD-10-CM | POA: Diagnosis not present

## 2019-04-21 DIAGNOSIS — E039 Hypothyroidism, unspecified: Secondary | ICD-10-CM | POA: Diagnosis not present

## 2019-04-21 DIAGNOSIS — M48061 Spinal stenosis, lumbar region without neurogenic claudication: Secondary | ICD-10-CM | POA: Diagnosis not present

## 2019-04-21 DIAGNOSIS — E785 Hyperlipidemia, unspecified: Secondary | ICD-10-CM | POA: Diagnosis not present

## 2019-04-21 DIAGNOSIS — Z981 Arthrodesis status: Secondary | ICD-10-CM | POA: Diagnosis not present

## 2019-04-21 DIAGNOSIS — T8141XA Infection following a procedure, superficial incisional surgical site, initial encounter: Secondary | ICD-10-CM | POA: Diagnosis not present

## 2019-04-21 DIAGNOSIS — D631 Anemia in chronic kidney disease: Secondary | ICD-10-CM | POA: Diagnosis not present

## 2019-04-21 DIAGNOSIS — Z792 Long term (current) use of antibiotics: Secondary | ICD-10-CM | POA: Diagnosis not present

## 2019-04-21 DIAGNOSIS — Z9181 History of falling: Secondary | ICD-10-CM | POA: Diagnosis not present

## 2019-04-21 DIAGNOSIS — K5730 Diverticulosis of large intestine without perforation or abscess without bleeding: Secondary | ICD-10-CM | POA: Diagnosis not present

## 2019-04-21 DIAGNOSIS — I2510 Atherosclerotic heart disease of native coronary artery without angina pectoris: Secondary | ICD-10-CM | POA: Diagnosis not present

## 2019-04-25 ENCOUNTER — Other Ambulatory Visit: Payer: Self-pay | Admitting: Neurosurgery

## 2019-04-25 DIAGNOSIS — T8149XA Infection following a procedure, other surgical site, initial encounter: Secondary | ICD-10-CM

## 2019-04-26 DIAGNOSIS — I129 Hypertensive chronic kidney disease with stage 1 through stage 4 chronic kidney disease, or unspecified chronic kidney disease: Secondary | ICD-10-CM | POA: Diagnosis not present

## 2019-04-26 DIAGNOSIS — M1990 Unspecified osteoarthritis, unspecified site: Secondary | ICD-10-CM | POA: Diagnosis not present

## 2019-04-26 DIAGNOSIS — N183 Chronic kidney disease, stage 3 (moderate): Secondary | ICD-10-CM | POA: Diagnosis not present

## 2019-04-26 DIAGNOSIS — E785 Hyperlipidemia, unspecified: Secondary | ICD-10-CM | POA: Diagnosis not present

## 2019-04-26 DIAGNOSIS — Z9181 History of falling: Secondary | ICD-10-CM | POA: Diagnosis not present

## 2019-04-26 DIAGNOSIS — K5730 Diverticulosis of large intestine without perforation or abscess without bleeding: Secondary | ICD-10-CM | POA: Diagnosis not present

## 2019-04-26 DIAGNOSIS — M48061 Spinal stenosis, lumbar region without neurogenic claudication: Secondary | ICD-10-CM | POA: Diagnosis not present

## 2019-04-26 DIAGNOSIS — T8141XA Infection following a procedure, superficial incisional surgical site, initial encounter: Secondary | ICD-10-CM | POA: Diagnosis not present

## 2019-04-26 DIAGNOSIS — I2510 Atherosclerotic heart disease of native coronary artery without angina pectoris: Secondary | ICD-10-CM | POA: Diagnosis not present

## 2019-04-26 DIAGNOSIS — D631 Anemia in chronic kidney disease: Secondary | ICD-10-CM | POA: Diagnosis not present

## 2019-04-26 DIAGNOSIS — Z792 Long term (current) use of antibiotics: Secondary | ICD-10-CM | POA: Diagnosis not present

## 2019-04-26 DIAGNOSIS — Z981 Arthrodesis status: Secondary | ICD-10-CM | POA: Diagnosis not present

## 2019-04-26 DIAGNOSIS — E039 Hypothyroidism, unspecified: Secondary | ICD-10-CM | POA: Diagnosis not present

## 2019-04-26 DIAGNOSIS — E1122 Type 2 diabetes mellitus with diabetic chronic kidney disease: Secondary | ICD-10-CM | POA: Diagnosis not present

## 2019-04-29 ENCOUNTER — Other Ambulatory Visit: Payer: Self-pay | Admitting: Internal Medicine

## 2019-04-29 DIAGNOSIS — I129 Hypertensive chronic kidney disease with stage 1 through stage 4 chronic kidney disease, or unspecified chronic kidney disease: Secondary | ICD-10-CM | POA: Diagnosis not present

## 2019-04-29 DIAGNOSIS — N183 Chronic kidney disease, stage 3 (moderate): Secondary | ICD-10-CM | POA: Diagnosis not present

## 2019-04-29 DIAGNOSIS — M1990 Unspecified osteoarthritis, unspecified site: Secondary | ICD-10-CM | POA: Diagnosis not present

## 2019-04-29 DIAGNOSIS — E039 Hypothyroidism, unspecified: Secondary | ICD-10-CM | POA: Diagnosis not present

## 2019-04-29 DIAGNOSIS — Z792 Long term (current) use of antibiotics: Secondary | ICD-10-CM | POA: Diagnosis not present

## 2019-04-29 DIAGNOSIS — Z9181 History of falling: Secondary | ICD-10-CM | POA: Diagnosis not present

## 2019-04-29 DIAGNOSIS — Z981 Arthrodesis status: Secondary | ICD-10-CM | POA: Diagnosis not present

## 2019-04-29 DIAGNOSIS — T8141XA Infection following a procedure, superficial incisional surgical site, initial encounter: Secondary | ICD-10-CM | POA: Diagnosis not present

## 2019-04-29 DIAGNOSIS — E785 Hyperlipidemia, unspecified: Secondary | ICD-10-CM | POA: Diagnosis not present

## 2019-04-29 DIAGNOSIS — K5730 Diverticulosis of large intestine without perforation or abscess without bleeding: Secondary | ICD-10-CM | POA: Diagnosis not present

## 2019-04-29 DIAGNOSIS — G629 Polyneuropathy, unspecified: Secondary | ICD-10-CM

## 2019-04-29 DIAGNOSIS — E1122 Type 2 diabetes mellitus with diabetic chronic kidney disease: Secondary | ICD-10-CM | POA: Diagnosis not present

## 2019-04-29 DIAGNOSIS — I2510 Atherosclerotic heart disease of native coronary artery without angina pectoris: Secondary | ICD-10-CM | POA: Diagnosis not present

## 2019-04-29 DIAGNOSIS — M48061 Spinal stenosis, lumbar region without neurogenic claudication: Secondary | ICD-10-CM | POA: Diagnosis not present

## 2019-04-29 DIAGNOSIS — D631 Anemia in chronic kidney disease: Secondary | ICD-10-CM | POA: Diagnosis not present

## 2019-04-29 MED ORDER — GABAPENTIN 300 MG PO CAPS: 300.0000 mg | ORAL_CAPSULE | Freq: Every day | ORAL | 3 refills | Status: DC

## 2019-05-02 DIAGNOSIS — Z981 Arthrodesis status: Secondary | ICD-10-CM | POA: Diagnosis not present

## 2019-05-02 DIAGNOSIS — Z9181 History of falling: Secondary | ICD-10-CM | POA: Diagnosis not present

## 2019-05-02 DIAGNOSIS — E785 Hyperlipidemia, unspecified: Secondary | ICD-10-CM | POA: Diagnosis not present

## 2019-05-02 DIAGNOSIS — K5730 Diverticulosis of large intestine without perforation or abscess without bleeding: Secondary | ICD-10-CM | POA: Diagnosis not present

## 2019-05-02 DIAGNOSIS — E1122 Type 2 diabetes mellitus with diabetic chronic kidney disease: Secondary | ICD-10-CM | POA: Diagnosis not present

## 2019-05-02 DIAGNOSIS — I2510 Atherosclerotic heart disease of native coronary artery without angina pectoris: Secondary | ICD-10-CM | POA: Diagnosis not present

## 2019-05-02 DIAGNOSIS — D631 Anemia in chronic kidney disease: Secondary | ICD-10-CM | POA: Diagnosis not present

## 2019-05-02 DIAGNOSIS — M1990 Unspecified osteoarthritis, unspecified site: Secondary | ICD-10-CM | POA: Diagnosis not present

## 2019-05-02 DIAGNOSIS — Z792 Long term (current) use of antibiotics: Secondary | ICD-10-CM | POA: Diagnosis not present

## 2019-05-02 DIAGNOSIS — E039 Hypothyroidism, unspecified: Secondary | ICD-10-CM | POA: Diagnosis not present

## 2019-05-02 DIAGNOSIS — N183 Chronic kidney disease, stage 3 (moderate): Secondary | ICD-10-CM | POA: Diagnosis not present

## 2019-05-02 DIAGNOSIS — T8141XA Infection following a procedure, superficial incisional surgical site, initial encounter: Secondary | ICD-10-CM | POA: Diagnosis not present

## 2019-05-02 DIAGNOSIS — I129 Hypertensive chronic kidney disease with stage 1 through stage 4 chronic kidney disease, or unspecified chronic kidney disease: Secondary | ICD-10-CM | POA: Diagnosis not present

## 2019-05-02 DIAGNOSIS — M48061 Spinal stenosis, lumbar region without neurogenic claudication: Secondary | ICD-10-CM | POA: Diagnosis not present

## 2019-05-04 ENCOUNTER — Other Ambulatory Visit: Payer: Self-pay

## 2019-05-04 ENCOUNTER — Ambulatory Visit (HOSPITAL_COMMUNITY)
Admission: RE | Admit: 2019-05-04 | Discharge: 2019-05-04 | Disposition: A | Discharge status: Home / Self Care | Payer: Medicare Other | Source: Ambulatory Visit | Attending: Internal Medicine | Admitting: Internal Medicine

## 2019-05-04 VITALS — BP 162/59 | HR 58 | Temp 97.0°F | Resp 20

## 2019-05-04 DIAGNOSIS — N184 Chronic kidney disease, stage 4 (severe): Secondary | ICD-10-CM | POA: Diagnosis not present

## 2019-05-04 LAB — FERRITIN: Ferritin: 715 ng/mL — ABNORMAL HIGH (ref 11–307)

## 2019-05-04 LAB — IRON AND TIBC
Iron: 50 ug/dL (ref 28–170)
Saturation Ratios: 25 % (ref 10.4–31.8)
TIBC: 202 ug/dL — ABNORMAL LOW (ref 250–450)
UIBC: 152 ug/dL

## 2019-05-04 LAB — POCT HEMOGLOBIN-HEMACUE: Hemoglobin: 8.9 g/dL — ABNORMAL LOW (ref 12.0–15.0)

## 2019-05-04 MED ORDER — EPOETIN ALFA-EPBX 10000 UNIT/ML IJ SOLN
20000.0000 [IU] | INTRAMUSCULAR | Status: DC
  Administered 2019-05-04: 10:00:00 20000 [IU] via SUBCUTANEOUS
  Filled 2019-05-04: qty 2

## 2019-05-05 DIAGNOSIS — M1990 Unspecified osteoarthritis, unspecified site: Secondary | ICD-10-CM | POA: Diagnosis not present

## 2019-05-05 DIAGNOSIS — E039 Hypothyroidism, unspecified: Secondary | ICD-10-CM | POA: Diagnosis not present

## 2019-05-05 DIAGNOSIS — D631 Anemia in chronic kidney disease: Secondary | ICD-10-CM | POA: Diagnosis not present

## 2019-05-05 DIAGNOSIS — Z792 Long term (current) use of antibiotics: Secondary | ICD-10-CM | POA: Diagnosis not present

## 2019-05-05 DIAGNOSIS — M48061 Spinal stenosis, lumbar region without neurogenic claudication: Secondary | ICD-10-CM | POA: Diagnosis not present

## 2019-05-05 DIAGNOSIS — I129 Hypertensive chronic kidney disease with stage 1 through stage 4 chronic kidney disease, or unspecified chronic kidney disease: Secondary | ICD-10-CM | POA: Diagnosis not present

## 2019-05-05 DIAGNOSIS — E1122 Type 2 diabetes mellitus with diabetic chronic kidney disease: Secondary | ICD-10-CM | POA: Diagnosis not present

## 2019-05-05 DIAGNOSIS — Z981 Arthrodesis status: Secondary | ICD-10-CM | POA: Diagnosis not present

## 2019-05-05 DIAGNOSIS — K5730 Diverticulosis of large intestine without perforation or abscess without bleeding: Secondary | ICD-10-CM | POA: Diagnosis not present

## 2019-05-05 DIAGNOSIS — E785 Hyperlipidemia, unspecified: Secondary | ICD-10-CM | POA: Diagnosis not present

## 2019-05-05 DIAGNOSIS — Z9181 History of falling: Secondary | ICD-10-CM | POA: Diagnosis not present

## 2019-05-05 DIAGNOSIS — T8141XA Infection following a procedure, superficial incisional surgical site, initial encounter: Secondary | ICD-10-CM | POA: Diagnosis not present

## 2019-05-05 DIAGNOSIS — I2510 Atherosclerotic heart disease of native coronary artery without angina pectoris: Secondary | ICD-10-CM | POA: Diagnosis not present

## 2019-05-08 ENCOUNTER — Encounter: Payer: Self-pay | Admitting: Infectious Diseases

## 2019-05-08 NOTE — Progress Notes (Signed)
See note please BP still elevated not sure if had meds   Still dealing with abscess

## 2019-05-08 NOTE — Progress Notes (Signed)
See note please   Thanks

## 2019-05-09 DIAGNOSIS — E1122 Type 2 diabetes mellitus with diabetic chronic kidney disease: Secondary | ICD-10-CM | POA: Diagnosis not present

## 2019-05-09 DIAGNOSIS — N183 Chronic kidney disease, stage 3 unspecified: Secondary | ICD-10-CM | POA: Diagnosis not present

## 2019-05-09 DIAGNOSIS — E039 Hypothyroidism, unspecified: Secondary | ICD-10-CM | POA: Diagnosis not present

## 2019-05-09 DIAGNOSIS — Z792 Long term (current) use of antibiotics: Secondary | ICD-10-CM | POA: Diagnosis not present

## 2019-05-09 DIAGNOSIS — I2510 Atherosclerotic heart disease of native coronary artery without angina pectoris: Secondary | ICD-10-CM | POA: Diagnosis not present

## 2019-05-09 DIAGNOSIS — D631 Anemia in chronic kidney disease: Secondary | ICD-10-CM | POA: Diagnosis not present

## 2019-05-09 DIAGNOSIS — K5730 Diverticulosis of large intestine without perforation or abscess without bleeding: Secondary | ICD-10-CM | POA: Diagnosis not present

## 2019-05-09 DIAGNOSIS — M48061 Spinal stenosis, lumbar region without neurogenic claudication: Secondary | ICD-10-CM | POA: Diagnosis not present

## 2019-05-09 DIAGNOSIS — M1990 Unspecified osteoarthritis, unspecified site: Secondary | ICD-10-CM | POA: Diagnosis not present

## 2019-05-09 DIAGNOSIS — Z9181 History of falling: Secondary | ICD-10-CM | POA: Diagnosis not present

## 2019-05-09 DIAGNOSIS — I129 Hypertensive chronic kidney disease with stage 1 through stage 4 chronic kidney disease, or unspecified chronic kidney disease: Secondary | ICD-10-CM | POA: Diagnosis not present

## 2019-05-09 DIAGNOSIS — Z981 Arthrodesis status: Secondary | ICD-10-CM | POA: Diagnosis not present

## 2019-05-09 DIAGNOSIS — T8141XA Infection following a procedure, superficial incisional surgical site, initial encounter: Secondary | ICD-10-CM | POA: Diagnosis not present

## 2019-05-09 DIAGNOSIS — E785 Hyperlipidemia, unspecified: Secondary | ICD-10-CM | POA: Diagnosis not present

## 2019-05-12 DIAGNOSIS — M1990 Unspecified osteoarthritis, unspecified site: Secondary | ICD-10-CM | POA: Diagnosis not present

## 2019-05-12 DIAGNOSIS — T8141XA Infection following a procedure, superficial incisional surgical site, initial encounter: Secondary | ICD-10-CM | POA: Diagnosis not present

## 2019-05-12 DIAGNOSIS — E039 Hypothyroidism, unspecified: Secondary | ICD-10-CM | POA: Diagnosis not present

## 2019-05-12 DIAGNOSIS — K5730 Diverticulosis of large intestine without perforation or abscess without bleeding: Secondary | ICD-10-CM | POA: Diagnosis not present

## 2019-05-12 DIAGNOSIS — Z9181 History of falling: Secondary | ICD-10-CM | POA: Diagnosis not present

## 2019-05-12 DIAGNOSIS — E785 Hyperlipidemia, unspecified: Secondary | ICD-10-CM | POA: Diagnosis not present

## 2019-05-12 DIAGNOSIS — E1122 Type 2 diabetes mellitus with diabetic chronic kidney disease: Secondary | ICD-10-CM | POA: Diagnosis not present

## 2019-05-12 DIAGNOSIS — Z981 Arthrodesis status: Secondary | ICD-10-CM | POA: Diagnosis not present

## 2019-05-12 DIAGNOSIS — I129 Hypertensive chronic kidney disease with stage 1 through stage 4 chronic kidney disease, or unspecified chronic kidney disease: Secondary | ICD-10-CM | POA: Diagnosis not present

## 2019-05-12 DIAGNOSIS — D631 Anemia in chronic kidney disease: Secondary | ICD-10-CM | POA: Diagnosis not present

## 2019-05-12 DIAGNOSIS — M48061 Spinal stenosis, lumbar region without neurogenic claudication: Secondary | ICD-10-CM | POA: Diagnosis not present

## 2019-05-12 DIAGNOSIS — Z792 Long term (current) use of antibiotics: Secondary | ICD-10-CM | POA: Diagnosis not present

## 2019-05-12 DIAGNOSIS — I2510 Atherosclerotic heart disease of native coronary artery without angina pectoris: Secondary | ICD-10-CM | POA: Diagnosis not present

## 2019-05-16 DIAGNOSIS — M1990 Unspecified osteoarthritis, unspecified site: Secondary | ICD-10-CM | POA: Diagnosis not present

## 2019-05-16 DIAGNOSIS — M48061 Spinal stenosis, lumbar region without neurogenic claudication: Secondary | ICD-10-CM | POA: Diagnosis not present

## 2019-05-16 DIAGNOSIS — K5730 Diverticulosis of large intestine without perforation or abscess without bleeding: Secondary | ICD-10-CM | POA: Diagnosis not present

## 2019-05-16 DIAGNOSIS — Z981 Arthrodesis status: Secondary | ICD-10-CM | POA: Diagnosis not present

## 2019-05-16 DIAGNOSIS — D631 Anemia in chronic kidney disease: Secondary | ICD-10-CM | POA: Diagnosis not present

## 2019-05-16 DIAGNOSIS — Z9181 History of falling: Secondary | ICD-10-CM | POA: Diagnosis not present

## 2019-05-16 DIAGNOSIS — Z792 Long term (current) use of antibiotics: Secondary | ICD-10-CM | POA: Diagnosis not present

## 2019-05-16 DIAGNOSIS — E039 Hypothyroidism, unspecified: Secondary | ICD-10-CM | POA: Diagnosis not present

## 2019-05-16 DIAGNOSIS — E785 Hyperlipidemia, unspecified: Secondary | ICD-10-CM | POA: Diagnosis not present

## 2019-05-16 DIAGNOSIS — I129 Hypertensive chronic kidney disease with stage 1 through stage 4 chronic kidney disease, or unspecified chronic kidney disease: Secondary | ICD-10-CM | POA: Diagnosis not present

## 2019-05-16 DIAGNOSIS — E1122 Type 2 diabetes mellitus with diabetic chronic kidney disease: Secondary | ICD-10-CM | POA: Diagnosis not present

## 2019-05-16 DIAGNOSIS — I2510 Atherosclerotic heart disease of native coronary artery without angina pectoris: Secondary | ICD-10-CM | POA: Diagnosis not present

## 2019-05-16 DIAGNOSIS — N183 Chronic kidney disease, stage 3 unspecified: Secondary | ICD-10-CM | POA: Diagnosis not present

## 2019-05-16 DIAGNOSIS — T8141XA Infection following a procedure, superficial incisional surgical site, initial encounter: Secondary | ICD-10-CM | POA: Diagnosis not present

## 2019-05-18 ENCOUNTER — Encounter (HOSPITAL_COMMUNITY)
Admission: RE | Admit: 2019-05-18 | Discharge: 2019-05-18 | Disposition: A | Discharge status: Home / Self Care | Payer: Medicare Other | Source: Ambulatory Visit | Attending: Internal Medicine | Admitting: Internal Medicine

## 2019-05-18 ENCOUNTER — Other Ambulatory Visit: Payer: Self-pay

## 2019-05-18 VITALS — BP 116/56 | HR 58 | Temp 98.6°F

## 2019-05-18 DIAGNOSIS — N184 Chronic kidney disease, stage 4 (severe): Secondary | ICD-10-CM | POA: Insufficient documentation

## 2019-05-18 LAB — POCT HEMOGLOBIN-HEMACUE: Hemoglobin: 9.2 g/dL — ABNORMAL LOW (ref 12.0–15.0)

## 2019-05-18 MED ORDER — EPOETIN ALFA-EPBX 10000 UNIT/ML IJ SOLN
20000.0000 [IU] | INTRAMUSCULAR | Status: DC
  Administered 2019-05-18: 20000 [IU] via SUBCUTANEOUS
  Filled 2019-05-18: qty 2

## 2019-05-19 DIAGNOSIS — I129 Hypertensive chronic kidney disease with stage 1 through stage 4 chronic kidney disease, or unspecified chronic kidney disease: Secondary | ICD-10-CM | POA: Diagnosis not present

## 2019-05-19 DIAGNOSIS — M1990 Unspecified osteoarthritis, unspecified site: Secondary | ICD-10-CM | POA: Diagnosis not present

## 2019-05-19 DIAGNOSIS — I2510 Atherosclerotic heart disease of native coronary artery without angina pectoris: Secondary | ICD-10-CM | POA: Diagnosis not present

## 2019-05-19 DIAGNOSIS — M48061 Spinal stenosis, lumbar region without neurogenic claudication: Secondary | ICD-10-CM | POA: Diagnosis not present

## 2019-05-19 DIAGNOSIS — K5730 Diverticulosis of large intestine without perforation or abscess without bleeding: Secondary | ICD-10-CM | POA: Diagnosis not present

## 2019-05-19 DIAGNOSIS — E785 Hyperlipidemia, unspecified: Secondary | ICD-10-CM | POA: Diagnosis not present

## 2019-05-19 DIAGNOSIS — T8141XA Infection following a procedure, superficial incisional surgical site, initial encounter: Secondary | ICD-10-CM | POA: Diagnosis not present

## 2019-05-19 DIAGNOSIS — N183 Chronic kidney disease, stage 3 unspecified: Secondary | ICD-10-CM | POA: Diagnosis not present

## 2019-05-19 DIAGNOSIS — Z981 Arthrodesis status: Secondary | ICD-10-CM | POA: Diagnosis not present

## 2019-05-19 DIAGNOSIS — Z9181 History of falling: Secondary | ICD-10-CM | POA: Diagnosis not present

## 2019-05-19 DIAGNOSIS — E039 Hypothyroidism, unspecified: Secondary | ICD-10-CM | POA: Diagnosis not present

## 2019-05-19 DIAGNOSIS — D631 Anemia in chronic kidney disease: Secondary | ICD-10-CM | POA: Diagnosis not present

## 2019-05-19 DIAGNOSIS — E1122 Type 2 diabetes mellitus with diabetic chronic kidney disease: Secondary | ICD-10-CM | POA: Diagnosis not present

## 2019-05-19 DIAGNOSIS — Z792 Long term (current) use of antibiotics: Secondary | ICD-10-CM | POA: Diagnosis not present

## 2019-05-20 ENCOUNTER — Other Ambulatory Visit: Payer: Self-pay

## 2019-05-20 ENCOUNTER — Other Ambulatory Visit: Payer: Self-pay | Admitting: Neurosurgery

## 2019-05-20 ENCOUNTER — Ambulatory Visit
Admission: RE | Admit: 2019-05-20 | Discharge: 2019-05-20 | Disposition: A | Discharge status: Home / Self Care | Payer: Medicare Other | Source: Ambulatory Visit | Attending: Neurosurgery | Admitting: Neurosurgery

## 2019-05-20 DIAGNOSIS — T8149XA Infection following a procedure, other surgical site, initial encounter: Secondary | ICD-10-CM

## 2019-05-20 DIAGNOSIS — M5136 Other intervertebral disc degeneration, lumbar region: Secondary | ICD-10-CM | POA: Diagnosis not present

## 2019-05-23 DIAGNOSIS — T8141XA Infection following a procedure, superficial incisional surgical site, initial encounter: Secondary | ICD-10-CM | POA: Diagnosis not present

## 2019-05-23 DIAGNOSIS — L02212 Cutaneous abscess of back [any part, except buttock]: Secondary | ICD-10-CM | POA: Diagnosis not present

## 2019-05-25 DIAGNOSIS — E039 Hypothyroidism, unspecified: Secondary | ICD-10-CM | POA: Diagnosis not present

## 2019-05-25 DIAGNOSIS — D631 Anemia in chronic kidney disease: Secondary | ICD-10-CM | POA: Diagnosis not present

## 2019-05-25 DIAGNOSIS — Z981 Arthrodesis status: Secondary | ICD-10-CM | POA: Diagnosis not present

## 2019-05-25 DIAGNOSIS — E785 Hyperlipidemia, unspecified: Secondary | ICD-10-CM | POA: Diagnosis not present

## 2019-05-25 DIAGNOSIS — Z9181 History of falling: Secondary | ICD-10-CM | POA: Diagnosis not present

## 2019-05-25 DIAGNOSIS — I2510 Atherosclerotic heart disease of native coronary artery without angina pectoris: Secondary | ICD-10-CM | POA: Diagnosis not present

## 2019-05-25 DIAGNOSIS — M48061 Spinal stenosis, lumbar region without neurogenic claudication: Secondary | ICD-10-CM | POA: Diagnosis not present

## 2019-05-25 DIAGNOSIS — M1990 Unspecified osteoarthritis, unspecified site: Secondary | ICD-10-CM | POA: Diagnosis not present

## 2019-05-25 DIAGNOSIS — K5730 Diverticulosis of large intestine without perforation or abscess without bleeding: Secondary | ICD-10-CM | POA: Diagnosis not present

## 2019-05-25 DIAGNOSIS — I129 Hypertensive chronic kidney disease with stage 1 through stage 4 chronic kidney disease, or unspecified chronic kidney disease: Secondary | ICD-10-CM | POA: Diagnosis not present

## 2019-05-25 DIAGNOSIS — Z792 Long term (current) use of antibiotics: Secondary | ICD-10-CM | POA: Diagnosis not present

## 2019-05-25 DIAGNOSIS — T8141XA Infection following a procedure, superficial incisional surgical site, initial encounter: Secondary | ICD-10-CM | POA: Diagnosis not present

## 2019-05-25 DIAGNOSIS — E1122 Type 2 diabetes mellitus with diabetic chronic kidney disease: Secondary | ICD-10-CM | POA: Diagnosis not present

## 2019-05-25 DIAGNOSIS — N183 Chronic kidney disease, stage 3 unspecified: Secondary | ICD-10-CM | POA: Diagnosis not present

## 2019-05-28 DIAGNOSIS — T8141XA Infection following a procedure, superficial incisional surgical site, initial encounter: Secondary | ICD-10-CM | POA: Diagnosis not present

## 2019-05-28 DIAGNOSIS — Z981 Arthrodesis status: Secondary | ICD-10-CM | POA: Diagnosis not present

## 2019-05-28 DIAGNOSIS — I129 Hypertensive chronic kidney disease with stage 1 through stage 4 chronic kidney disease, or unspecified chronic kidney disease: Secondary | ICD-10-CM | POA: Diagnosis not present

## 2019-05-28 DIAGNOSIS — M1990 Unspecified osteoarthritis, unspecified site: Secondary | ICD-10-CM | POA: Diagnosis not present

## 2019-05-28 DIAGNOSIS — D631 Anemia in chronic kidney disease: Secondary | ICD-10-CM | POA: Diagnosis not present

## 2019-05-28 DIAGNOSIS — E785 Hyperlipidemia, unspecified: Secondary | ICD-10-CM | POA: Diagnosis not present

## 2019-05-28 DIAGNOSIS — E1122 Type 2 diabetes mellitus with diabetic chronic kidney disease: Secondary | ICD-10-CM | POA: Diagnosis not present

## 2019-05-28 DIAGNOSIS — Z9181 History of falling: Secondary | ICD-10-CM | POA: Diagnosis not present

## 2019-05-28 DIAGNOSIS — E039 Hypothyroidism, unspecified: Secondary | ICD-10-CM | POA: Diagnosis not present

## 2019-05-28 DIAGNOSIS — Z792 Long term (current) use of antibiotics: Secondary | ICD-10-CM | POA: Diagnosis not present

## 2019-05-28 DIAGNOSIS — K5730 Diverticulosis of large intestine without perforation or abscess without bleeding: Secondary | ICD-10-CM | POA: Diagnosis not present

## 2019-05-28 DIAGNOSIS — M48061 Spinal stenosis, lumbar region without neurogenic claudication: Secondary | ICD-10-CM | POA: Diagnosis not present

## 2019-05-28 DIAGNOSIS — N183 Chronic kidney disease, stage 3 unspecified: Secondary | ICD-10-CM | POA: Diagnosis not present

## 2019-05-28 DIAGNOSIS — I2510 Atherosclerotic heart disease of native coronary artery without angina pectoris: Secondary | ICD-10-CM | POA: Diagnosis not present

## 2019-05-30 ENCOUNTER — Other Ambulatory Visit: Payer: Self-pay

## 2019-05-30 ENCOUNTER — Inpatient Hospital Stay (HOSPITAL_COMMUNITY)
Admission: EM | Admit: 2019-05-30 | Discharge: 2019-07-08 | DRG: 264 | Disposition: A | Discharge status: Skilled Nursing Facility | Payer: Medicare Other | Attending: Internal Medicine | Admitting: Internal Medicine

## 2019-05-30 ENCOUNTER — Emergency Department (HOSPITAL_COMMUNITY): Payer: Medicare Other

## 2019-05-30 DIAGNOSIS — N179 Acute kidney failure, unspecified: Secondary | ICD-10-CM | POA: Diagnosis not present

## 2019-05-30 DIAGNOSIS — Z91011 Allergy to milk products: Secondary | ICD-10-CM | POA: Diagnosis not present

## 2019-05-30 DIAGNOSIS — Z419 Encounter for procedure for purposes other than remedying health state, unspecified: Secondary | ICD-10-CM

## 2019-05-30 DIAGNOSIS — E875 Hyperkalemia: Secondary | ICD-10-CM

## 2019-05-30 DIAGNOSIS — Z743 Need for continuous supervision: Secondary | ICD-10-CM | POA: Diagnosis not present

## 2019-05-30 DIAGNOSIS — K760 Fatty (change of) liver, not elsewhere classified: Secondary | ICD-10-CM | POA: Diagnosis not present

## 2019-05-30 DIAGNOSIS — I5033 Acute on chronic diastolic (congestive) heart failure: Secondary | ICD-10-CM | POA: Diagnosis not present

## 2019-05-30 DIAGNOSIS — Z841 Family history of disorders of kidney and ureter: Secondary | ICD-10-CM

## 2019-05-30 DIAGNOSIS — I132 Hypertensive heart and chronic kidney disease with heart failure and with stage 5 chronic kidney disease, or end stage renal disease: Secondary | ICD-10-CM | POA: Diagnosis not present

## 2019-05-30 DIAGNOSIS — W06XXXA Fall from bed, initial encounter: Secondary | ICD-10-CM

## 2019-05-30 DIAGNOSIS — I5043 Acute on chronic combined systolic (congestive) and diastolic (congestive) heart failure: Secondary | ICD-10-CM | POA: Diagnosis not present

## 2019-05-30 DIAGNOSIS — I700 Atherosclerosis of aorta: Secondary | ICD-10-CM | POA: Diagnosis not present

## 2019-05-30 DIAGNOSIS — J9621 Acute and chronic respiratory failure with hypoxia: Secondary | ICD-10-CM | POA: Diagnosis present

## 2019-05-30 DIAGNOSIS — Z8619 Personal history of other infectious and parasitic diseases: Secondary | ICD-10-CM | POA: Diagnosis not present

## 2019-05-30 DIAGNOSIS — Z992 Dependence on renal dialysis: Secondary | ICD-10-CM | POA: Diagnosis not present

## 2019-05-30 DIAGNOSIS — R2681 Unsteadiness on feet: Secondary | ICD-10-CM | POA: Diagnosis not present

## 2019-05-30 DIAGNOSIS — Z20828 Contact with and (suspected) exposure to other viral communicable diseases: Secondary | ICD-10-CM | POA: Diagnosis present

## 2019-05-30 DIAGNOSIS — R0902 Hypoxemia: Secondary | ICD-10-CM | POA: Diagnosis not present

## 2019-05-30 DIAGNOSIS — D638 Anemia in other chronic diseases classified elsewhere: Secondary | ICD-10-CM | POA: Diagnosis present

## 2019-05-30 DIAGNOSIS — R001 Bradycardia, unspecified: Secondary | ICD-10-CM | POA: Diagnosis not present

## 2019-05-30 DIAGNOSIS — E1122 Type 2 diabetes mellitus with diabetic chronic kidney disease: Secondary | ICD-10-CM | POA: Diagnosis not present

## 2019-05-30 DIAGNOSIS — Z833 Family history of diabetes mellitus: Secondary | ICD-10-CM | POA: Diagnosis not present

## 2019-05-30 DIAGNOSIS — T8463XA Infection and inflammatory reaction due to internal fixation device of spine, initial encounter: Secondary | ICD-10-CM | POA: Diagnosis not present

## 2019-05-30 DIAGNOSIS — Z8249 Family history of ischemic heart disease and other diseases of the circulatory system: Secondary | ICD-10-CM

## 2019-05-30 DIAGNOSIS — R600 Localized edema: Secondary | ICD-10-CM

## 2019-05-30 DIAGNOSIS — N186 End stage renal disease: Secondary | ICD-10-CM | POA: Diagnosis present

## 2019-05-30 DIAGNOSIS — E785 Hyperlipidemia, unspecified: Secondary | ICD-10-CM | POA: Diagnosis present

## 2019-05-30 DIAGNOSIS — R778 Other specified abnormalities of plasma proteins: Secondary | ICD-10-CM

## 2019-05-30 DIAGNOSIS — L089 Local infection of the skin and subcutaneous tissue, unspecified: Secondary | ICD-10-CM | POA: Diagnosis not present

## 2019-05-30 DIAGNOSIS — I509 Heart failure, unspecified: Secondary | ICD-10-CM | POA: Diagnosis not present

## 2019-05-30 DIAGNOSIS — A4150 Gram-negative sepsis, unspecified: Secondary | ICD-10-CM | POA: Diagnosis not present

## 2019-05-30 DIAGNOSIS — E1129 Type 2 diabetes mellitus with other diabetic kidney complication: Secondary | ICD-10-CM | POA: Diagnosis present

## 2019-05-30 DIAGNOSIS — Z79890 Hormone replacement therapy (postmenopausal): Secondary | ICD-10-CM

## 2019-05-30 DIAGNOSIS — E872 Acidosis: Secondary | ICD-10-CM | POA: Diagnosis not present

## 2019-05-30 DIAGNOSIS — Z888 Allergy status to other drugs, medicaments and biological substances status: Secondary | ICD-10-CM

## 2019-05-30 DIAGNOSIS — D631 Anemia in chronic kidney disease: Secondary | ICD-10-CM | POA: Diagnosis not present

## 2019-05-30 DIAGNOSIS — M4326 Fusion of spine, lumbar region: Secondary | ICD-10-CM | POA: Diagnosis not present

## 2019-05-30 DIAGNOSIS — Z91013 Allergy to seafood: Secondary | ICD-10-CM | POA: Diagnosis not present

## 2019-05-30 DIAGNOSIS — I10 Essential (primary) hypertension: Secondary | ICD-10-CM | POA: Diagnosis not present

## 2019-05-30 DIAGNOSIS — I129 Hypertensive chronic kidney disease with stage 1 through stage 4 chronic kidney disease, or unspecified chronic kidney disease: Secondary | ICD-10-CM | POA: Diagnosis not present

## 2019-05-30 DIAGNOSIS — E890 Postprocedural hypothyroidism: Secondary | ICD-10-CM | POA: Diagnosis not present

## 2019-05-30 DIAGNOSIS — A419 Sepsis, unspecified organism: Secondary | ICD-10-CM | POA: Diagnosis not present

## 2019-05-30 DIAGNOSIS — Z23 Encounter for immunization: Secondary | ICD-10-CM | POA: Diagnosis not present

## 2019-05-30 DIAGNOSIS — N170 Acute kidney failure with tubular necrosis: Secondary | ICD-10-CM | POA: Diagnosis present

## 2019-05-30 DIAGNOSIS — D649 Anemia, unspecified: Secondary | ICD-10-CM | POA: Diagnosis not present

## 2019-05-30 DIAGNOSIS — R0600 Dyspnea, unspecified: Secondary | ICD-10-CM | POA: Diagnosis not present

## 2019-05-30 DIAGNOSIS — Z981 Arthrodesis status: Secondary | ICD-10-CM

## 2019-05-30 DIAGNOSIS — Z832 Family history of diseases of the blood and blood-forming organs and certain disorders involving the immune mechanism: Secondary | ICD-10-CM

## 2019-05-30 DIAGNOSIS — N183 Chronic kidney disease, stage 3 unspecified: Secondary | ICD-10-CM | POA: Diagnosis not present

## 2019-05-30 DIAGNOSIS — T8463XD Infection and inflammatory reaction due to internal fixation device of spine, subsequent encounter: Secondary | ICD-10-CM | POA: Diagnosis not present

## 2019-05-30 DIAGNOSIS — I160 Hypertensive urgency: Secondary | ICD-10-CM | POA: Diagnosis present

## 2019-05-30 DIAGNOSIS — N2581 Secondary hyperparathyroidism of renal origin: Secondary | ICD-10-CM | POA: Diagnosis not present

## 2019-05-30 DIAGNOSIS — L89152 Pressure ulcer of sacral region, stage 2: Secondary | ICD-10-CM | POA: Diagnosis not present

## 2019-05-30 DIAGNOSIS — T8149XA Infection following a procedure, other surgical site, initial encounter: Secondary | ICD-10-CM | POA: Diagnosis not present

## 2019-05-30 DIAGNOSIS — U071 COVID-19: Secondary | ICD-10-CM | POA: Diagnosis not present

## 2019-05-30 DIAGNOSIS — L8990 Pressure ulcer of unspecified site, unspecified stage: Secondary | ICD-10-CM | POA: Insufficient documentation

## 2019-05-30 DIAGNOSIS — R509 Fever, unspecified: Secondary | ICD-10-CM | POA: Diagnosis not present

## 2019-05-30 DIAGNOSIS — N184 Chronic kidney disease, stage 4 (severe): Secondary | ICD-10-CM | POA: Diagnosis not present

## 2019-05-30 DIAGNOSIS — M6281 Muscle weakness (generalized): Secondary | ICD-10-CM | POA: Diagnosis not present

## 2019-05-30 DIAGNOSIS — L89322 Pressure ulcer of left buttock, stage 2: Secondary | ICD-10-CM | POA: Diagnosis not present

## 2019-05-30 DIAGNOSIS — R079 Chest pain, unspecified: Secondary | ICD-10-CM | POA: Diagnosis not present

## 2019-05-30 DIAGNOSIS — F0390 Unspecified dementia without behavioral disturbance: Secondary | ICD-10-CM | POA: Diagnosis not present

## 2019-05-30 DIAGNOSIS — N178 Other acute kidney failure: Secondary | ICD-10-CM | POA: Diagnosis not present

## 2019-05-30 DIAGNOSIS — E02 Subclinical iodine-deficiency hypothyroidism: Secondary | ICD-10-CM | POA: Diagnosis not present

## 2019-05-30 DIAGNOSIS — Z7401 Bed confinement status: Secondary | ICD-10-CM | POA: Diagnosis not present

## 2019-05-30 DIAGNOSIS — R0602 Shortness of breath: Secondary | ICD-10-CM

## 2019-05-30 DIAGNOSIS — G92 Toxic encephalopathy: Secondary | ICD-10-CM | POA: Diagnosis not present

## 2019-05-30 DIAGNOSIS — K5900 Constipation, unspecified: Secondary | ICD-10-CM | POA: Diagnosis not present

## 2019-05-30 DIAGNOSIS — R05 Cough: Secondary | ICD-10-CM | POA: Diagnosis not present

## 2019-05-30 DIAGNOSIS — I130 Hypertensive heart and chronic kidney disease with heart failure and stage 1 through stage 4 chronic kidney disease, or unspecified chronic kidney disease: Secondary | ICD-10-CM | POA: Diagnosis not present

## 2019-05-30 DIAGNOSIS — I5031 Acute diastolic (congestive) heart failure: Secondary | ICD-10-CM | POA: Diagnosis not present

## 2019-05-30 DIAGNOSIS — E039 Hypothyroidism, unspecified: Secondary | ICD-10-CM | POA: Diagnosis not present

## 2019-05-30 DIAGNOSIS — L89000 Pressure ulcer of unspecified elbow, unstageable: Secondary | ICD-10-CM | POA: Diagnosis not present

## 2019-05-30 DIAGNOSIS — M2550 Pain in unspecified joint: Secondary | ICD-10-CM | POA: Diagnosis not present

## 2019-05-30 DIAGNOSIS — I959 Hypotension, unspecified: Secondary | ICD-10-CM | POA: Diagnosis not present

## 2019-05-30 DIAGNOSIS — N1831 Chronic kidney disease, stage 3a: Secondary | ICD-10-CM | POA: Diagnosis not present

## 2019-05-30 DIAGNOSIS — I110 Hypertensive heart disease with heart failure: Secondary | ICD-10-CM | POA: Diagnosis not present

## 2019-05-30 DIAGNOSIS — N2889 Other specified disorders of kidney and ureter: Secondary | ICD-10-CM | POA: Diagnosis not present

## 2019-05-30 DIAGNOSIS — E1121 Type 2 diabetes mellitus with diabetic nephropathy: Secondary | ICD-10-CM | POA: Diagnosis not present

## 2019-05-30 LAB — CBC
HCT: 29.8 % — ABNORMAL LOW (ref 36.0–46.0)
Hemoglobin: 8.9 g/dL — ABNORMAL LOW (ref 12.0–15.0)
MCH: 26.1 pg (ref 26.0–34.0)
MCHC: 29.9 g/dL — ABNORMAL LOW (ref 30.0–36.0)
MCV: 87.4 fL (ref 80.0–100.0)
Platelets: 436 10*3/uL — ABNORMAL HIGH (ref 150–400)
RBC: 3.41 MIL/uL — ABNORMAL LOW (ref 3.87–5.11)
RDW: 17.1 % — ABNORMAL HIGH (ref 11.5–15.5)
WBC: 14.9 10*3/uL — ABNORMAL HIGH (ref 4.0–10.5)
nRBC: 0 % (ref 0.0–0.2)

## 2019-05-30 LAB — BASIC METABOLIC PANEL
Anion gap: 13 (ref 5–15)
BUN: 55 mg/dL — ABNORMAL HIGH (ref 8–23)
CO2: 16 mmol/L — ABNORMAL LOW (ref 22–32)
Calcium: 8.6 mg/dL — ABNORMAL LOW (ref 8.9–10.3)
Chloride: 107 mmol/L (ref 98–111)
Creatinine, Ser: 5.74 mg/dL — ABNORMAL HIGH (ref 0.44–1.00)
GFR calc Af Amer: 8 mL/min — ABNORMAL LOW (ref >60)
GFR calc non Af Amer: 7 mL/min — ABNORMAL LOW (ref >60)
Glucose, Bld: 103 mg/dL — ABNORMAL HIGH (ref 70–99)
Potassium: 5.3 mmol/L — ABNORMAL HIGH (ref 3.5–5.1)
Sodium: 136 mmol/L (ref 135–145)

## 2019-05-30 LAB — BRAIN NATRIURETIC PEPTIDE: B Natriuretic Peptide: 851.4 pg/mL — ABNORMAL HIGH (ref 0.0–100.0)

## 2019-05-30 LAB — TROPONIN I (HIGH SENSITIVITY)
Troponin I (High Sensitivity): 38 ng/L — ABNORMAL HIGH (ref <18)
Troponin I (High Sensitivity): 48 ng/L — ABNORMAL HIGH (ref <18)

## 2019-05-30 LAB — CK: Total CK: 364 U/L — ABNORMAL HIGH (ref 38–234)

## 2019-05-30 MED ORDER — FUROSEMIDE 10 MG/ML IJ SOLN
60.0000 mg | Freq: Once | INTRAMUSCULAR | Status: AC
  Administered 2019-05-30: 60 mg via INTRAVENOUS
  Filled 2019-05-30: qty 6

## 2019-05-30 MED ORDER — IPRATROPIUM BROMIDE HFA 17 MCG/ACT IN AERS
2.0000 | INHALATION_SPRAY | Freq: Once | RESPIRATORY_TRACT | Status: DC
  Filled 2019-05-30: qty 12.9

## 2019-05-30 MED ORDER — ALBUTEROL SULFATE HFA 108 (90 BASE) MCG/ACT IN AERS
4.0000 | INHALATION_SPRAY | Freq: Once | RESPIRATORY_TRACT | Status: AC
  Administered 2019-05-30: 4 via RESPIRATORY_TRACT
  Filled 2019-05-30: qty 6.7

## 2019-05-30 MED ORDER — SODIUM CHLORIDE 0.9% FLUSH
3.0000 mL | Freq: Once | INTRAVENOUS | Status: AC
  Administered 2019-05-30: 3 mL via INTRAVENOUS

## 2019-05-30 NOTE — ED Triage Notes (Signed)
Patient reports falling out of bed last night, states she tried to get up and felt weak - was on floor until her son helped her up this morning. She denies hitting her head or having any bodily injury or pain. She endorses shortness of breath and feeling "winded" for 2 days, slight cough, but denies chest pain or fevers/chills. Expiratory wheezing auscultated upper lobes, but diminished lower. States she has just felt generally weak lately.

## 2019-05-30 NOTE — ED Provider Notes (Addendum)
[facility] EMERGENCY DEPARTMENT Provider Note   CSN: [id number] Arrival date & time: 05/30/19  1009     History   Chief Complaint Chief Complaint  Patient presents with  . Shortness of Breath  . Fall    HPI Taylor Tyler is a 78 y.o. female.     Patient c/o progressive sob, bil leg swelling, orthopnea, for the past 2 days. Symptoms acute onset, moderate, persistent, slowly worse, worse this evening. This AM states rolled out of bed - family member had to assist to get up. Denies specific injury or pain post fall. States compliant w home meds. Indicates urinating normal amount. Denies dysuria. No headache. No neck or back pain. No chest pain. No abd pain.   The history is provided by the patient.  Shortness of Breath Associated symptoms: no abdominal pain, no chest pain, no cough, no fever, no headaches, no neck pain, no rash, no sore throat and no vomiting   Fall Associated symptoms include shortness of breath. Pertinent negatives include no chest pain, no abdominal pain and no headaches.    Past Medical History:  Diagnosis Date  . Acute blood loss anemia 03/14/2017  . Acute renal failure superimposed on stage 3 chronic kidney disease ([place]) 03/14/2017  . AKI (acute kidney injury) ([place]) 02/28/2017  . Altered mental status 03/29/2017  . Anemia   . Aortic atherosclerosis ([place])   . Arthritis    "joints might ache at times; not that bad" (06/10/2018)  . Bilateral lower extremity edema 03/04/2017  . Bradycardia   . Chronic kidney disease    ?? renal insufficiency,   . CKD (chronic kidney disease) stage 3, GFR 30-59 ml/min 01/15/2017   ?? renal insufficiency, which she thinks is coming from "all these medications"  . Diabetes mellitus without complication ([place])    diagnosed 4-5 yrs ago, 06/21/18- "that was years ago"  . Disease of pancreas   . Diverticulitis    s/p perforation and partial colectomy 01/27/14 with 3 benign lymph nodes   . Diverticulosis 01/15/2017   . DJD (degenerative joint disease)   . DM (diabetes mellitus), type 2 with renal complications ([place]) 4/66/5993  . Fatty liver   . Hypertension   . Hypothyroidism    "had radiation" (06/10/2018)  . Kidney stone   . Lethargy 02/28/2017  . Obesity, Class III, BMI 40-49.9 (morbid obesity) ([place]) 02/27/2017  . Pleural lipoma   . Postoperative wound infection 04/02/2017  . Spinal stenosis of lumbar region 01/15/2017  . Status post lumbar surgery   . Wound healing, delayed    back    Patient Active Problem List   Diagnosis Date Noted  . Infection and inflammatory reaction due to internal fixation device of spine, initial encounter ([place]) 06/05/2018  . Neuropathy 01/08/2018  . Fatty liver 01/05/2018  . Chronic pain of both knees 01/05/2018  . Ventral hernia 01/05/2018  . CKD (chronic kidney disease) stage 4, GFR 15-29 ml/min ([facility]) 07/21/2017  . Vitamin D deficiency 07/21/2017  . Essential hypertension   . Postoperative wound infection 04/02/2017  . Anemia 03/14/2017  . Acute blood loss anemia 03/14/2017  . Acute renal failure superimposed on stage 3 chronic kidney disease ([place]) 03/14/2017  . Obesity, Class III, BMI 40-49.9 (morbid obesity) ([place]) 02/27/2017  . DM (diabetes mellitus), type 2 with renal complications ([place]) 57/08/7791  . Status post lumbar surgery 02/24/2017  . CKD (chronic kidney disease) stage 3, GFR 30-59 ml/min 01/15/2017  . Hypothyroidism 01/15/2017  . Diverticulosis  01/15/2017  . HLD (hyperlipidemia) 01/15/2017  . Spinal stenosis of lumbar region 01/15/2017  . Disease of pancreas 07/21/2012    Past Surgical History:  Procedure Laterality Date  . ABDOMINAL EXPOSURE N/A 02/24/2017   Procedure: ABDOMINAL EXPOSURE;  Surgeon: Angelia Mould, MD;  Location: [facility];  Service: Vascular;  Laterality: N/A;  . ABDOMINAL HYSTERECTOMY  1987   no h/o abnormal paps   . ANTERIOR LAT LUMBAR FUSION N/A 02/24/2017   Procedure: Lumbar three- five Anterior lateral lumbar  interbody fusion;  Surgeon: Ditty, Kevan Ny, MD;  Location: [facility];  Service: Neurosurgery;  Laterality: N/A;  L3-5 Anterior lateral lumbar interbody fusion with removal of coflex at L3-4, L4-5  . ANTERIOR LUMBAR FUSION N/A 02/24/2017   Procedure: Stage 1: Lumbar five-Sacral one Anterior lumbar interbody fusion;  Surgeon: Ditty, Kevan Ny, MD;  Location: [facility];  Service: Neurosurgery;  Laterality: N/A;  Stage 1: L5-S1 Anterior lumbar interbody fusion  . APPENDECTOMY    . APPLICATION OF ROBOTIC ASSISTANCE FOR SPINAL PROCEDURE N/A 02/26/2017   Procedure: APPLICATION OF ROBOTIC ASSISTANCE FOR SPINAL PROCEDURE;  Surgeon: Ditty, Kevan Ny, MD;  Location: [facility];  Service: Neurosurgery;  Laterality: N/A;  . APPLICATION OF WOUND VAC N/A 03/30/2017   Procedure: APPLICATION OF WOUND VAC;  Surgeon: Ditty, Kevan Ny, MD;  Location: [facility];  Service: Neurosurgery;  Laterality: N/A;  . BACK SURGERY  2013  . CATARACT EXTRACTION W/ INTRAOCULAR LENS IMPLANT Right   . CHOLECYSTECTOMY OPEN  1982  . COLON SURGERY  01/26/2014   desc.sigmoid colectomy and ventral hernia repair and splenic flexure mobilization  . COLON SURGERY    . DILATION AND CURETTAGE OF UTERUS    . HERNIA REPAIR    . LUMBAR LAMINECTOMY WITH SPINOUS PROCESS PLATE 2 LEVEL N/A 0/25/8527   Procedure: LUMBAR LAMINECTOMY/DECOMPRESSION MICRODISCECTOMY CoFlex;  Surgeon: Faythe Ghee, MD;  Location: [facility];  Service: Neurosurgery;  Laterality: N/A;  Lumbar three-four,Lumbar Four-Five Laminectomy with Coflex  . LUMBAR WOUND DEBRIDEMENT N/A 03/30/2017   Procedure: Lumbar wound exploration/debridement, placement of wound vac;  Surgeon: Ditty, Kevan Ny, MD;  Location: [facility];  Service: Neurosurgery;  Laterality: N/A;  Lumbar wound exploration/debridement, placement of wound vac  . LUMBAR WOUND DEBRIDEMENT N/A 06/06/2018   Procedure: LUMBAR WOUND DEBRIDEMENT/EXPLORATION;  Surgeon: Consuella Lose, MD;  Location: [facility];  Service:  Neurosurgery;  Laterality: N/A;  . LUMBAR WOUND DEBRIDEMENT N/A 06/22/2018   Procedure: SIMPLE INCISION AND DRAINAGE OF WOUND, APPLICATION OF WOUND VAC;  Surgeon: Consuella Lose, MD;  Location: [facility];  Service: Neurosurgery;  Laterality: N/A;  . PARTIAL COLECTOMY     01/27/14 diverticulitis and 3 benign lymph nodes [facility] Dr. Pat Patrick   . REDUCTION MAMMAPLASTY Bilateral 1992  . TUBAL LIGATION    . VENTRAL HERNIA REPAIR  2014     OB History   No obstetric history on file.      Home Medications    Prior to Admission medications   Medication Sig Start Date End Date Taking? Authorizing Provider  acetaminophen (TYLENOL) 500 MG tablet Take 500 mg by mouth 2 (two) times daily as needed for mild pain.     [provider]  amLODipine (NORVASC) 10 MG tablet Take 1 tablet (10 mg total) by mouth daily. 06/12/18   Hosie Poisson, MD  atorvastatin (LIPITOR) 40 MG tablet Take 1 tablet (40 mg total) by mouth daily at 6 PM. 09/03/18   McLean-Scocuzza, Nino Glow, MD  carvedilol (COREG) 25 MG tablet  Take 1 tablet (25 mg total) by mouth 2 (two) times daily with a meal. 06/12/18   Hosie Poisson, MD  ferrous sulfate 325 (65 FE) MG tablet Take 325 mg by mouth daily with breakfast.    [provider]  furosemide (LASIX) 40 MG tablet Take 1 tablet (40 mg total) by mouth 2 (two) times daily. In am and with lunch as needed leg swelling. Further refills Dr. Johnney Ou (kidney doctor) 03/07/19   McLean-Scocuzza, Nino Glow, MD  gabapentin (NEURONTIN) 300 MG capsule Take 1 capsule (300 mg total) by mouth at bedtime. 04/29/19   McLean-Scocuzza, Nino Glow, MD  hydrALAZINE (APRESOLINE) 50 MG tablet Take 1 tablet (50 mg total) by mouth every 8 (eight) hours. 06/11/18   Hosie Poisson, MD  isosorbide mononitrate (IMDUR) 30 MG 24 hr tablet Take 1 tablet (30 mg total) by mouth daily. 06/06/18   Cassandria Santee, MD  labetalol (NORMODYNE) 100 MG tablet Take 100 mg by mouth 2 (two) times daily.    [provider]   levothyroxine (SYNTHROID, LEVOTHROID) 100 MCG tablet Take 100 mcg by mouth daily before breakfast.    [provider]    Family History Family History  Problem Relation Age of Onset  . Diabetes Father   . Heart attack Father   . Hypertension Father   . Heart disease Father   . Kidney disease Father   . Diabetes Mother   . Hypertension Mother   . Heart disease Mother   . Kidney disease Mother   . Lupus Sister   . Heart disease Brother   . Diabetes Brother   . Heart disease Brother   . Diabetes Brother   . Lupus Sister     Social History Social History   Tobacco Use  . Smoking status: Never Smoker  . Smokeless tobacco: Never Used  Substance Use Topics  . Alcohol use: Not Currently    Frequency: Never    Comment: "nothing since age 66" (06/10/2018)  . Drug use: Never     Allergies   Clams [shellfish allergy], Hydralazine, Norvasc [amlodipine besylate], and Milk-related compounds   Review of Systems Review of Systems  Constitutional: Negative for chills and fever.  HENT: Negative for sore throat.   Eyes: Negative for redness.  Respiratory: Positive for shortness of breath. Negative for cough.   Cardiovascular: Positive for leg swelling. Negative for chest pain.  Gastrointestinal: Negative for abdominal pain, diarrhea and vomiting.  Endocrine: Negative for polyuria.  Genitourinary: Negative for dysuria and flank pain.  Musculoskeletal: Negative for back pain and neck pain.  Skin: Negative for rash.  Neurological: Negative for headaches.  Hematological: Does not bruise/bleed easily.  Psychiatric/Behavioral: Negative for confusion.     Physical Exam Updated Vital Signs BP (!) 165/95 (BP Location: Right Arm)   Pulse 75   Temp 98.2 F (36.8 C) (Oral)   Resp 15   SpO2 98%   Physical Exam Vitals signs and nursing note reviewed.  Constitutional:      Appearance: Normal appearance. She is well-developed.  HENT:     Head: Atraumatic.     Comments:  No facial or scalp pain, tenderness, or sts.     Nose: Nose normal.     Mouth/Throat:     Mouth: Mucous membranes are moist.  Eyes:     General: No scleral icterus.    Conjunctiva/sclera: Conjunctivae normal.     Pupils: Pupils are equal, round, and reactive to light.  Neck:     Musculoskeletal: Normal  range of motion and neck supple. No neck rigidity or muscular tenderness.     Trachea: No tracheal deviation.  Cardiovascular:     Rate and Rhythm: Normal rate and regular rhythm.     Pulses: Normal pulses.     Heart sounds: Normal heart sounds. No murmur. No friction rub. No gallop.   Pulmonary:     Effort: Pulmonary effort is normal. No respiratory distress.     Breath sounds: Rales present.     Comments: Rales bases.  Chest:     Chest wall: No tenderness.  Abdominal:     General: Bowel sounds are normal. There is no distension.     Palpations: Abdomen is soft.     Tenderness: There is no abdominal tenderness. There is no guarding.  Genitourinary:    Comments: No cva tenderness.  Musculoskeletal:        General: Swelling present.     Right lower leg: Edema present.     Left lower leg: Edema present.     Comments: Symmetric bilateral leg edema, moderate.   Skin:    General: Skin is warm and dry.     Findings: No rash.  Neurological:     Mental Status: She is alert.     Comments: Alert, speech normal. Motor/sens grossly intact bil.   Psychiatric:        Mood and Affect: Mood normal.      ED Treatments / Results  Labs (all labs ordered are listed, but only abnormal results are displayed) Results for orders placed or performed during the hospital encounter of 66/44/03  Basic metabolic panel  Result Value Ref Range   Sodium 136 135 - 145 mmol/L   Potassium 5.3 (H) 3.5 - 5.1 mmol/L   Chloride 107 98 - 111 mmol/L   CO2 16 (L) 22 - 32 mmol/L   Glucose, Bld 103 (H) 70 - 99 mg/dL   BUN 55 (H) 8 - 23 mg/dL   Creatinine, Ser 5.74 (H) 0.44 - 1.00 mg/dL   Calcium 8.6 (L)  8.9 - 10.3 mg/dL   GFR calc non Af Amer 7 (L) >60 mL/min   GFR calc Af Amer 8 (L) >60 mL/min   Anion gap 13 5 - 15  CBC  Result Value Ref Range   WBC 14.9 (H) 4.0 - 10.5 K/uL   RBC 3.41 (L) 3.87 - 5.11 MIL/uL   Hemoglobin 8.9 (L) 12.0 - 15.0 g/dL   HCT 29.8 (L) 36.0 - 46.0 %   MCV 87.4 80.0 - 100.0 fL   MCH 26.1 26.0 - 34.0 pg   MCHC 29.9 (L) 30.0 - 36.0 g/dL   RDW 17.1 (H) 11.5 - 15.5 %   Platelets 436 (H) 150 - 400 K/uL   nRBC 0.0 0.0 - 0.2 %  Brain natriuretic peptide  Result Value Ref Range   B Natriuretic Peptide 851.4 (H) 0.0 - 100.0 pg/mL  Troponin I (High Sensitivity)  Result Value Ref Range   Troponin I (High Sensitivity) 38 (H) <18 ng/L  Troponin I (High Sensitivity)  Result Value Ref Range   Troponin I (High Sensitivity) 48 (H) <18 ng/L   Dg Chest 2 View  Result Date: 05/30/2019 CLINICAL DATA:  Patient reports fall from bed last night. Denies any pains. Reports sob and wheezing for 2 days. Denies any chest pains, cough, nvd, fever. EXAM: CHEST - 2 VIEW COMPARISON:  Chest radiograph 06/07/2018 FINDINGS: Stable cardiomediastinal contours with enlarged heart size. Aortic atherosclerotic calcification. Mild central vascular  congestion without overt edema. No focal pulmonary opacities to suggest infection. No pneumothorax or pleural effusion. No acute finding in the visualized skeleton. IMPRESSION: Cardiomegaly with mild central vascular congestion. No overt edema or evidence of infection. Electronically Signed   By: Audie Pinto M.D.   On: 05/30/2019 11:06   Mr Lumbar Spine Wo Contrast  Result Date: 05/20/2019 CLINICAL DATA:  Right buttock and hip pain. Surgery in November 2019 with nonhealing wound. EXAM: MRI LUMBAR SPINE WITHOUT CONTRAST TECHNIQUE: Multiplanar, multisequence MR imaging of the lumbar spine was performed. No intravenous contrast was administered. COMPARISON:  06/05/2018 FINDINGS: Segmentation: The lowest lumbar type non-rib-bearing vertebra is labeled as  L5. Alignment:  3 mm of grade 1 degenerative retrolisthesis at L2-3. Vertebrae: Considerable metal artifact from posterolateral rod and pedicle screw fixation at L3-L4-L5-S1 bilaterally. Suspected complex fluid collection along the posterior decompression site measuring about 5.0 by 3.8 by 3.0 cm (volume = 30 cm^3). There is previously a fluid collection with air-level in the site on the prior exam from 06/04/2018. On image 30/8 and image 4/4, possibility of the lower portion of this fluid collection connected back into the subcutaneous tissues and potentially to the cutaneous surface is raised, the appearance resembles a sinus tract. Correlate with any continued leakage. I do not observe continuity of the posterior fluid collection with the thecal sac itself. Conus medullaris and cauda equina: Conus extends to the L1 level. The conus appears normal. I do not see a definite abnormality of the cauda equina, but the signal loss due to the artifact as well as motion artifact somewhat obscure the cauda equina. Paraspinal and other soft tissues: Accentuated T2 signal in the paraspinal musculature as can be encountered in the postoperative setting. Subtle presacral edema. I do not see any significant degree of vertebral body edema to suggest osteomyelitis. Right common iliac node 0.9 cm in short axis on image 30/8. Disc levels: T11-12: No impingement.  Disc bulge noted. T12-L1: Unremarkable. L1-2: No impingement, diffuse disc bulge. L2-3: Moderate to prominent central narrowing of the thecal sac due to disc bulge and facet arthropathy, similar to prior. L3-4: No definite impingement.  Fused level. L4-5: Stable mild to moderate left foraminal stenosis due to spurring. L5-S1: No impingement. Fused level. Residual disc material or central spurring noted. IMPRESSION: 1. Suspected chronic fluid collection along the posterior decompression site in the lumbar spine, with appearance suggesting a potential midline draining sinus  tract from the inferior margin of this process extending back to the cutaneous surface, for example on image 4/12. 2. Spondylosis and degenerative disc disease lead to stable moderate to prominent central narrowing of the thecal sac at L2-3 and stable mild to moderate left foraminal impingement at L4-5. 3. Metal artifact and motion artifact reduced diagnostic sensitivity and specificity. 4. Electronically Signed   By: Van Clines M.D.   On: 05/20/2019 16:40    EKG EKG Interpretation  Date/Time:  Monday May 30 2019 20:47:10 EDT Ventricular Rate:  78 PR Interval:    QRS Duration: 86 QT Interval:  344 QTC Calculation: 392 R Axis:   75 Text Interpretation: Sinus rhythm Nonspecific T wave abnormality Confirmed by Lajean Saver [phone]) on 05/30/2019 10:17:39 PM   Radiology Dg Chest 2 View  Result Date: 05/30/2019 CLINICAL DATA:  Patient reports fall from bed last night. Denies any pains. Reports sob and wheezing for 2 days. Denies any chest pains, cough, nvd, fever. EXAM: CHEST - 2 VIEW COMPARISON:  Chest radiograph 06/07/2018 FINDINGS: Stable cardiomediastinal contours  with enlarged heart size. Aortic atherosclerotic calcification. Mild central vascular congestion without overt edema. No focal pulmonary opacities to suggest infection. No pneumothorax or pleural effusion. No acute finding in the visualized skeleton. IMPRESSION: Cardiomegaly with mild central vascular congestion. No overt edema or evidence of infection. Electronically Signed   By: Audie Pinto M.D.   On: 05/30/2019 11:06    Procedures Procedures (including critical care time)  Medications Ordered in ED Medications  ipratropium (ATROVENT HFA) inhaler 2 puff (2 puffs Inhalation Not Given 05/30/19 1351)  sodium chloride flush (NS) 0.9 % injection 3 mL (3 mLs Intravenous Given 05/30/19 2058)  albuterol (VENTOLIN HFA) 108 (90 Base) MCG/ACT inhaler 4 puff (4 puffs Inhalation Given 05/30/19 1049)  furosemide (LASIX)  injection 60 mg (60 mg Intravenous Given 05/30/19 2059)     Initial Impression / Assessment and Plan / ED Course  I have reviewed the triage vital signs and the nursing notes.  Pertinent labs & imaging results that were available during my care of the patient were reviewed by me and considered in my medical decision making (see chart for details).  Iv ns. o2 [place]. Stat labs. Pcxr. Continuous pulse ox and monitor. ecg.  Reviewed nursing notes and prior charts for additional history.   CXR reviewed/interpreted by me - vascular congestion/chf. Lasix 60 mg iv.   Labs reviewed/interpreted by me - bnp markedly elevated. Worsening renal fxn/aki. k elevated with aki. lokelma po.  Trop is mildly elev - no current cp or discomfort, will check delta.   Recheck pt, on o2, w meds, pt feels breathing mildly improved from prior. No current cp.   CRITICAL CARE RE: aucte on chronic combined chf with progressive dyspnea/chf, AKI, hyperkalemia, elevated troponin Performed by: Mirna Mires Total critical care time: 35 minutes Critical care time was exclusive of separately billable procedures and treating other patients. Critical care was necessary to treat or prevent imminent or life-threatening deterioration. Critical care was time spent personally by me on the following activities: development of treatment plan with patient and/or surrogate as well as nursing, discussions with consultants, evaluation of patient's response to treatment, examination of patient, obtaining history from patient or surrogate, ordering and performing treatments and interventions, ordering and review of laboratory studies, ordering and review of radiographic studies, pulse oximetry and re-evaluation of patient's condition.  Unassigned medicine consulted for admission.     Final Clinical Impressions(s) / ED Diagnoses   Final diagnoses:  None    ED Discharge Orders    None           Lajean Saver, MD 05/30/19 2223

## 2019-05-31 ENCOUNTER — Inpatient Hospital Stay (HOSPITAL_COMMUNITY): Payer: Medicare Other

## 2019-05-31 ENCOUNTER — Encounter (HOSPITAL_COMMUNITY): Payer: Self-pay | Admitting: Internal Medicine

## 2019-05-31 DIAGNOSIS — N1831 Chronic kidney disease, stage 3a: Secondary | ICD-10-CM

## 2019-05-31 DIAGNOSIS — N179 Acute kidney failure, unspecified: Secondary | ICD-10-CM

## 2019-05-31 DIAGNOSIS — N183 Chronic kidney disease, stage 3 unspecified: Secondary | ICD-10-CM

## 2019-05-31 DIAGNOSIS — E1121 Type 2 diabetes mellitus with diabetic nephropathy: Secondary | ICD-10-CM

## 2019-05-31 DIAGNOSIS — I5031 Acute diastolic (congestive) heart failure: Secondary | ICD-10-CM

## 2019-05-31 LAB — CBC
HCT: 28.2 % — ABNORMAL LOW (ref 36.0–46.0)
HCT: 27.7 % — ABNORMAL LOW (ref 36.0–46.0)
Hemoglobin: 8.7 g/dL — ABNORMAL LOW (ref 12.0–15.0)
Hemoglobin: 8.3 g/dL — ABNORMAL LOW (ref 12.0–15.0)
MCH: 26.4 pg (ref 26.0–34.0)
MCH: 26.3 pg (ref 26.0–34.0)
MCHC: 30.9 g/dL (ref 30.0–36.0)
MCHC: 30 g/dL (ref 30.0–36.0)
MCV: 85.7 fL (ref 80.0–100.0)
MCV: 87.9 fL (ref 80.0–100.0)
Platelets: 462 10*3/uL — ABNORMAL HIGH (ref 150–400)
Platelets: 468 10*3/uL — ABNORMAL HIGH (ref 150–400)
RBC: 3.29 MIL/uL — ABNORMAL LOW (ref 3.87–5.11)
RBC: 3.15 MIL/uL — ABNORMAL LOW (ref 3.87–5.11)
RDW: 16.7 % — ABNORMAL HIGH (ref 11.5–15.5)
RDW: 17 % — ABNORMAL HIGH (ref 11.5–15.5)
WBC: 14.7 10*3/uL — ABNORMAL HIGH (ref 4.0–10.5)
WBC: 14.7 10*3/uL — ABNORMAL HIGH (ref 4.0–10.5)
nRBC: 0 % (ref 0.0–0.2)
nRBC: 0 % (ref 0.0–0.2)

## 2019-05-31 LAB — URINALYSIS, MICROSCOPIC (REFLEX)

## 2019-05-31 LAB — BASIC METABOLIC PANEL
Anion gap: 12 (ref 5–15)
BUN: 55 mg/dL — ABNORMAL HIGH (ref 8–23)
CO2: 16 mmol/L — ABNORMAL LOW (ref 22–32)
Calcium: 8.7 mg/dL — ABNORMAL LOW (ref 8.9–10.3)
Chloride: 108 mmol/L (ref 98–111)
Creatinine, Ser: 5.79 mg/dL — ABNORMAL HIGH (ref 0.44–1.00)
GFR calc Af Amer: 8 mL/min — ABNORMAL LOW (ref >60)
GFR calc non Af Amer: 7 mL/min — ABNORMAL LOW (ref >60)
Glucose, Bld: 70 mg/dL (ref 70–99)
Potassium: 4.6 mmol/L (ref 3.5–5.1)
Sodium: 136 mmol/L (ref 135–145)

## 2019-05-31 LAB — CREATININE, SERUM
Creatinine, Ser: 6.13 mg/dL — ABNORMAL HIGH (ref 0.44–1.00)
GFR calc Af Amer: 7 mL/min — ABNORMAL LOW (ref >60)
GFR calc non Af Amer: 6 mL/min — ABNORMAL LOW (ref >60)

## 2019-05-31 LAB — HEPATIC FUNCTION PANEL
ALT: 16 U/L (ref 0–44)
AST: 27 U/L (ref 15–41)
Albumin: 2 g/dL — ABNORMAL LOW (ref 3.5–5.0)
Alkaline Phosphatase: 51 U/L (ref 38–126)
Bilirubin, Direct: <0.1 mg/dL (ref 0.0–0.2)
Total Bilirubin: 0.5 mg/dL (ref 0.3–1.2)
Total Protein: 7.2 g/dL (ref 6.5–8.1)

## 2019-05-31 LAB — SODIUM, URINE, RANDOM: Sodium, Ur: 88 mmol/L

## 2019-05-31 LAB — T4, FREE: Free T4: 0.84 ng/dL (ref 0.61–1.12)

## 2019-05-31 LAB — HEMOGLOBIN A1C
Hgb A1c MFr Bld: 5.2 % (ref 4.8–5.6)
Mean Plasma Glucose: 102.54 mg/dL

## 2019-05-31 LAB — CK: Total CK: 283 U/L — ABNORMAL HIGH (ref 38–234)

## 2019-05-31 LAB — URINALYSIS, ROUTINE W REFLEX MICROSCOPIC
Bilirubin Urine: NEGATIVE
Glucose, UA: NEGATIVE mg/dL
Ketones, ur: 15 mg/dL — AB
Leukocytes,Ua: NEGATIVE
Nitrite: NEGATIVE
Protein, ur: >300 mg/dL — AB
Specific Gravity, Urine: 1.02 (ref 1.005–1.030)
pH: 6 (ref 5.0–8.0)

## 2019-05-31 LAB — CBG MONITORING, ED
Glucose-Capillary: 70 mg/dL (ref 70–99)
Glucose-Capillary: 65 mg/dL — ABNORMAL LOW (ref 70–99)

## 2019-05-31 LAB — CREATININE, URINE, RANDOM: Creatinine, Urine: 49.05 mg/dL

## 2019-05-31 LAB — D-DIMER, QUANTITATIVE: D-Dimer, Quant: 2.94 ug/mL-FEU — ABNORMAL HIGH (ref 0.00–0.50)

## 2019-05-31 LAB — SARS CORONAVIRUS 2 (TAT 6-24 HRS): SARS Coronavirus 2: NEGATIVE

## 2019-05-31 LAB — GLUCOSE, CAPILLARY
Glucose-Capillary: 93 mg/dL (ref 70–99)
Glucose-Capillary: 101 mg/dL — ABNORMAL HIGH (ref 70–99)

## 2019-05-31 LAB — TSH: TSH: 5.569 u[IU]/mL — ABNORMAL HIGH (ref 0.350–4.500)

## 2019-05-31 MED ORDER — TECHNETIUM TO 99M ALBUMIN AGGREGATED
1.5000 | Freq: Once | INTRAVENOUS | Status: AC | PRN
  Administered 2019-05-31: 1.5 via INTRAVENOUS

## 2019-05-31 MED ORDER — LABETALOL HCL 100 MG PO TABS
100.0000 mg | ORAL_TABLET | Freq: Two times a day (BID) | ORAL | Status: DC
  Administered 2019-05-31 – 2019-06-04 (×9): 100 mg via ORAL
  Filled 2019-05-31 (×9): qty 1

## 2019-05-31 MED ORDER — FERROUS SULFATE 325 (65 FE) MG PO TABS
325.0000 mg | ORAL_TABLET | Freq: Every day | ORAL | Status: DC
  Administered 2019-05-31 – 2019-06-08 (×9): 325 mg via ORAL
  Filled 2019-05-31 (×10): qty 1

## 2019-05-31 MED ORDER — HYDRALAZINE HCL 20 MG/ML IJ SOLN
10.0000 mg | INTRAMUSCULAR | Status: DC | PRN
  Administered 2019-05-31 – 2019-06-15 (×3): 10 mg via INTRAVENOUS
  Filled 2019-05-31 (×3): qty 1

## 2019-05-31 MED ORDER — GABAPENTIN 300 MG PO CAPS
300.0000 mg | ORAL_CAPSULE | Freq: Every day | ORAL | Status: DC
  Administered 2019-05-31 – 2019-06-08 (×10): 300 mg via ORAL
  Filled 2019-05-31 (×10): qty 1

## 2019-05-31 MED ORDER — SODIUM CHLORIDE 0.9 % IV SOLN
INTRAVENOUS | Status: DC
  Administered 2019-05-31 – 2019-06-01 (×3): via INTRAVENOUS

## 2019-05-31 MED ORDER — AMLODIPINE BESYLATE 10 MG PO TABS
10.0000 mg | ORAL_TABLET | Freq: Every day | ORAL | Status: DC
  Administered 2019-05-31 – 2019-07-01 (×26): 10 mg via ORAL
  Filled 2019-05-31 (×3): qty 1
  Filled 2019-05-31: qty 2
  Filled 2019-05-31: qty 1
  Filled 2019-05-31: qty 2
  Filled 2019-05-31 (×23): qty 1

## 2019-05-31 MED ORDER — ISOSORBIDE MONONITRATE ER 30 MG PO TB24
30.0000 mg | ORAL_TABLET | Freq: Every day | ORAL | Status: DC
  Administered 2019-05-31: 30 mg via ORAL
  Filled 2019-05-31: qty 1

## 2019-05-31 MED ORDER — HYDRALAZINE HCL 50 MG PO TABS
50.0000 mg | ORAL_TABLET | Freq: Three times a day (TID) | ORAL | Status: DC
  Administered 2019-05-31 – 2019-06-12 (×37): 50 mg via ORAL
  Filled 2019-05-31 (×18): qty 1
  Filled 2019-05-31: qty 2
  Filled 2019-05-31 (×9): qty 1
  Filled 2019-05-31: qty 2
  Filled 2019-05-31 (×9): qty 1

## 2019-05-31 MED ORDER — LEVOTHYROXINE SODIUM 100 MCG PO TABS
100.0000 ug | ORAL_TABLET | Freq: Every day | ORAL | Status: DC
  Administered 2019-05-31 – 2019-07-08 (×36): 100 ug via ORAL
  Filled 2019-05-31 (×38): qty 1

## 2019-05-31 MED ORDER — ATORVASTATIN CALCIUM 40 MG PO TABS
40.0000 mg | ORAL_TABLET | Freq: Every day | ORAL | Status: DC
  Administered 2019-05-31 – 2019-07-07 (×33): 40 mg via ORAL
  Filled 2019-05-31 (×35): qty 1

## 2019-05-31 MED ORDER — CARVEDILOL 12.5 MG PO TABS: 25.0000 mg | ORAL_TABLET | Freq: Two times a day (BID) | ORAL | Status: DC

## 2019-05-31 MED ORDER — INFLUENZA VAC A&B SA ADJ QUAD 0.5 ML IM PRSY
0.5000 mL | PREFILLED_SYRINGE | INTRAMUSCULAR | Status: AC
  Administered 2019-06-01: 0.5 mL via INTRAMUSCULAR
  Filled 2019-05-31: qty 0.5

## 2019-05-31 MED ORDER — HEPARIN SODIUM (PORCINE) 5000 UNIT/ML IJ SOLN
5000.0000 [IU] | Freq: Three times a day (TID) | INTRAMUSCULAR | Status: DC
  Administered 2019-05-31 – 2019-06-18 (×54): 5000 [IU] via SUBCUTANEOUS
  Filled 2019-05-31 (×54): qty 1

## 2019-05-31 MED ORDER — CLONIDINE HCL 0.1 MG PO TABS
0.1000 mg | ORAL_TABLET | Freq: Two times a day (BID) | ORAL | Status: DC
  Administered 2019-05-31 – 2019-06-04 (×9): 0.1 mg via ORAL
  Filled 2019-05-31 (×9): qty 1

## 2019-05-31 MED ORDER — ONDANSETRON HCL 4 MG PO TABS: 4.0000 mg | ORAL_TABLET | Freq: Four times a day (QID) | ORAL | Status: DC | PRN

## 2019-05-31 MED ORDER — ONDANSETRON HCL 4 MG/2ML IJ SOLN
4.0000 mg | Freq: Four times a day (QID) | INTRAMUSCULAR | Status: DC | PRN
  Administered 2019-06-23 – 2019-06-30 (×2): 4 mg via INTRAVENOUS
  Filled 2019-05-31: qty 2

## 2019-05-31 MED ORDER — TECHNETIUM TC 99M DIETHYLENETRIAME-PENTAACETIC ACID
38.7000 | Freq: Once | INTRAVENOUS | Status: AC | PRN
  Administered 2019-05-31: 38.7 via INTRAVENOUS

## 2019-05-31 MED ORDER — ACETAMINOPHEN 650 MG RE SUPP
650.0000 mg | Freq: Four times a day (QID) | RECTAL | Status: DC | PRN
  Filled 2019-05-31: qty 1

## 2019-05-31 MED ORDER — ACETAMINOPHEN 325 MG PO TABS
650.0000 mg | ORAL_TABLET | Freq: Four times a day (QID) | ORAL | Status: DC | PRN
  Administered 2019-06-02 – 2019-07-05 (×9): 650 mg via ORAL
  Filled 2019-05-31 (×13): qty 2

## 2019-05-31 MED ORDER — INSULIN ASPART 100 UNIT/ML ~~LOC~~ SOLN
0.0000 [IU] | Freq: Three times a day (TID) | SUBCUTANEOUS | Status: DC
  Administered 2019-06-01: 1 [IU] via SUBCUTANEOUS

## 2019-05-31 NOTE — H&P (Addendum)
History and Physical    Taylor Tyler:[id number] DOB: 06-May-1941 DOA: 05/30/2019  PCP: McLean-Scocuzza, Nino Glow, MD  Patient coming from: Home.  Chief Complaint: Fall.  Shortness of breath.  HPI: Taylor Tyler is a 78 y.o. female with history of diabetes mellitus type 2 presently not on medication, hypertension, chronic kidney disease stage III, anemia, hypothyroidism, hyperlipidemia, nonhealing wound of the low back after surgery was brought to the ER the patient had a fall at home.  Patient states on Sunday evening 2 days ago patient was trying to get out of the bed when she slid off the bed and fell onto the floor following which patient was not able to get up.  She was lying on the floor for at least 8 hours following which patient son came and knocked on the door.  Patient had to crawl and open the door.  Patient denies hitting her head or losing consciousness.  Patient stated to the general fall.  Able to move all extremities without difficulty.  Over the last few weeks patient has been feeling increasingly short of breath.  Increasing lower extremity edema.  ED Course: In the ER EKG shows normal sinus rhythm.  Chest x-ray showed nothing acute.  Labs show patient's creatinine is significantly worsened from June 2020 of 2.6-5.7 with potassium of 5.3.  On exam patient has elevated JVD and mild edema of the lower extremity.  CBC shows hemoglobin of 8.9 platelets was 436 WBC 14.9.  COVID-19 test negative.  UA is unremarkable.  Patient was given 60 mg of IV Lasix and admitted for possible fluid overload with respiratory failure and worsening renal function.  BNP is a 51.4 CK was 364 high-sensitivity troponin was 38 and 48.  Review of Systems: As per HPI, rest all negative.   Past Medical History:  Diagnosis Date  . Acute blood loss anemia 03/14/2017  . Acute renal failure superimposed on stage 3 chronic kidney disease ([place]) 03/14/2017  . AKI (acute kidney injury) ([place]) 02/28/2017  . Altered  mental status 03/29/2017  . Anemia   . Aortic atherosclerosis ([place])   . Arthritis    "joints might ache at times; not that bad" (06/10/2018)  . Bilateral lower extremity edema 03/04/2017  . Bradycardia   . Chronic kidney disease    ?? renal insufficiency,   . CKD (chronic kidney disease) stage 3, GFR 30-59 ml/min 01/15/2017   ?? renal insufficiency, which she thinks is coming from "all these medications"  . Diabetes mellitus without complication ([place])    diagnosed 4-5 yrs ago, 06/21/18- "that was years ago"  . Disease of pancreas   . Diverticulitis    s/p perforation and partial colectomy 01/27/14 with 3 benign lymph nodes   . Diverticulosis 01/15/2017  . DJD (degenerative joint disease)   . DM (diabetes mellitus), type 2 with renal complications ([place]) 2/58/5277  . Fatty liver   . Hypertension   . Hypothyroidism    "had radiation" (06/10/2018)  . Kidney stone   . Lethargy 02/28/2017  . Obesity, Class III, BMI 40-49.9 (morbid obesity) ([place]) 02/27/2017  . Pleural lipoma   . Postoperative wound infection 04/02/2017  . Spinal stenosis of lumbar region 01/15/2017  . Status post lumbar surgery   . Wound healing, delayed    back    Past Surgical History:  Procedure Laterality Date  . ABDOMINAL EXPOSURE N/A 02/24/2017   Procedure: ABDOMINAL EXPOSURE;  Surgeon: Angelia Mould, MD;  Location: [facility];  Service: Vascular;  Laterality: N/A;  . ABDOMINAL HYSTERECTOMY  1987   no h/o abnormal paps   . ANTERIOR LAT LUMBAR FUSION N/A 02/24/2017   Procedure: Lumbar three- five Anterior lateral lumbar interbody fusion;  Surgeon: Ditty, Kevan Ny, MD;  Location: [facility];  Service: Neurosurgery;  Laterality: N/A;  L3-5 Anterior lateral lumbar interbody fusion with removal of coflex at L3-4, L4-5  . ANTERIOR LUMBAR FUSION N/A 02/24/2017   Procedure: Stage 1: Lumbar five-Sacral one Anterior lumbar interbody fusion;  Surgeon: Ditty, Kevan Ny, MD;  Location: [facility];  Service: Neurosurgery;   Laterality: N/A;  Stage 1: L5-S1 Anterior lumbar interbody fusion  . APPENDECTOMY    . APPLICATION OF ROBOTIC ASSISTANCE FOR SPINAL PROCEDURE N/A 02/26/2017   Procedure: APPLICATION OF ROBOTIC ASSISTANCE FOR SPINAL PROCEDURE;  Surgeon: Ditty, Kevan Ny, MD;  Location: [facility];  Service: Neurosurgery;  Laterality: N/A;  . APPLICATION OF WOUND VAC N/A 03/30/2017   Procedure: APPLICATION OF WOUND VAC;  Surgeon: Ditty, Kevan Ny, MD;  Location: [facility];  Service: Neurosurgery;  Laterality: N/A;  . BACK SURGERY  2013  . CATARACT EXTRACTION W/ INTRAOCULAR LENS IMPLANT Right   . CHOLECYSTECTOMY OPEN  1982  . COLON SURGERY  01/26/2014   desc.sigmoid colectomy and ventral hernia repair and splenic flexure mobilization  . COLON SURGERY    . DILATION AND CURETTAGE OF UTERUS    . HERNIA REPAIR    . LUMBAR LAMINECTOMY WITH SPINOUS PROCESS PLATE 2 LEVEL N/A 7/34/2876   Procedure: LUMBAR LAMINECTOMY/DECOMPRESSION MICRODISCECTOMY CoFlex;  Surgeon: Faythe Ghee, MD;  Location: [facility];  Service: Neurosurgery;  Laterality: N/A;  Lumbar three-four,Lumbar Four-Five Laminectomy with Coflex  . LUMBAR WOUND DEBRIDEMENT N/A 03/30/2017   Procedure: Lumbar wound exploration/debridement, placement of wound vac;  Surgeon: Ditty, Kevan Ny, MD;  Location: [name];  Service: Neurosurgery;  Laterality: N/A;  Lumbar wound exploration/debridement, placement of wound vac  . LUMBAR WOUND DEBRIDEMENT N/A 06/06/2018   Procedure: LUMBAR WOUND DEBRIDEMENT/EXPLORATION;  Surgeon: Consuella Lose, MD;  Location: [facility];  Service: Neurosurgery;  Laterality: N/A;  . LUMBAR WOUND DEBRIDEMENT N/A 06/22/2018   Procedure: SIMPLE INCISION AND DRAINAGE OF WOUND, APPLICATION OF WOUND VAC;  Surgeon: Consuella Lose, MD;  Location: [facility];  Service: Neurosurgery;  Laterality: N/A;  . PARTIAL COLECTOMY     01/27/14 diverticulitis and 3 benign lymph nodes [facility] Dr. Pat Patrick   . REDUCTION MAMMAPLASTY Bilateral 1992  . TUBAL LIGATION     . VENTRAL HERNIA REPAIR  2014     reports that she has never smoked. She has never used smokeless tobacco. She reports previous alcohol use. She reports that she does not use drugs.  Allergies  Allergen Reactions  . Clams [Shellfish Allergy] Swelling and Other (See Comments)    THROAT SWELLS NECK TURNS RED  . Hydralazine Other (See Comments)    CHEST TIGHTNESS Patient has tolerated multiple doses of hydralazine since allergy was listed   . Norvasc [Amlodipine Besylate] Swelling    Leg edema   . Milk-Related Compounds Diarrhea    Family History  Problem Relation Age of Onset  . Diabetes Father   . Heart attack Father   . Hypertension Father   . Heart disease Father   . Kidney disease Father   . Diabetes Mother   . Hypertension Mother   . Heart disease Mother   . Kidney disease Mother   . Lupus Sister   . Heart disease Brother   . Diabetes Brother   . Heart  disease Brother   . Diabetes Brother   . Lupus Sister     Prior to Admission medications   Medication Sig Start Date End Date Taking? Authorizing Provider  acetaminophen (TYLENOL) 500 MG tablet Take 500 mg by mouth 2 (two) times daily as needed for mild pain.     [provider]  amLODipine (NORVASC) 10 MG tablet Take 1 tablet (10 mg total) by mouth daily. 06/12/18   Hosie Poisson, MD  atorvastatin (LIPITOR) 40 MG tablet Take 1 tablet (40 mg total) by mouth daily at 6 PM. 09/03/18   McLean-Scocuzza, Nino Glow, MD  carvedilol (COREG) 25 MG tablet Take 1 tablet (25 mg total) by mouth 2 (two) times daily with a meal. 06/12/18   Hosie Poisson, MD  ferrous sulfate 325 (65 FE) MG tablet Take 325 mg by mouth daily with breakfast.    [provider]  furosemide (LASIX) 40 MG tablet Take 1 tablet (40 mg total) by mouth 2 (two) times daily. In am and with lunch as needed leg swelling. Further refills Dr. Johnney Ou (kidney doctor) 03/07/19   McLean-Scocuzza, Nino Glow, MD  gabapentin (NEURONTIN) 300 MG capsule Take 1 capsule  (300 mg total) by mouth at bedtime. 04/29/19   McLean-Scocuzza, Nino Glow, MD  hydrALAZINE (APRESOLINE) 50 MG tablet Take 1 tablet (50 mg total) by mouth every 8 (eight) hours. 06/11/18   Hosie Poisson, MD  isosorbide mononitrate (IMDUR) 30 MG 24 hr tablet Take 1 tablet (30 mg total) by mouth daily. 06/06/18   Cassandria Santee, MD  labetalol (NORMODYNE) 100 MG tablet Take 100 mg by mouth 2 (two) times daily.    [provider]  levothyroxine (SYNTHROID, LEVOTHROID) 100 MCG tablet Take 100 mcg by mouth daily before breakfast.    [provider]    Physical Exam: Constitutional: Moderately built and nourished. Vitals:   05/30/19 1018 05/30/19 1609 05/30/19 2030 05/30/19 2300  BP: (!) 165/95   (!) 177/61  Pulse: 81 74 75 67  Resp: 20 18 15 14   Temp: 98 F (36.7 C) 98.2 F (36.8 C)    TempSrc: Oral Oral    SpO2: 98% 98% 98% 97%   Eyes: Anicteric no pallor. ENMT: No discharge from the ears eyes nose or mouth. Neck: No mass felt.  No neck rigidity.  Mild elevation of JVD. Respiratory: No rhonchi or crepitations. Cardiovascular: S1-S2 heard. Abdomen: Soft nontender bowel sounds present. Musculoskeletal: Mild edema of the lower extremity. Skin: No rash. Neurologic: Alert awake oriented to time place and person.  Moves all extremities. Psychiatric: Appears normal.   Labs on Admission: I have personally reviewed following labs and imaging studies  CBC: Recent Labs  Lab 05/30/19 1046  WBC 14.9*  HGB 8.9*  HCT 29.8*  MCV 87.4  PLT 283*   Basic Metabolic Panel: Recent Labs  Lab 05/30/19 1046  NA 136  K 5.3*  CL 107  CO2 16*  GLUCOSE 103*  BUN 55*  CREATININE 5.74*  CALCIUM 8.6*   GFR: CrCl cannot be calculated (Unknown ideal weight.). Liver Function Tests: No results for input(s): AST, ALT, ALKPHOS, BILITOT, PROT, ALBUMIN in the last 168 hours. No results for input(s): LIPASE, AMYLASE in the last 168 hours. No results for input(s): AMMONIA in the last 168  hours. Coagulation Profile: No results for input(s): INR, PROTIME in the last 168 hours. Cardiac Enzymes: Recent Labs  Lab 05/30/19 2056  CKTOTAL 364*   BNP (last 3 results) No results for input(s): PROBNP in the  last 8760 hours. HbA1C: No results for input(s): HGBA1C in the last 72 hours. CBG: No results for input(s): GLUCAP in the last 168 hours. Lipid Profile: No results for input(s): CHOL, HDL, LDLCALC, TRIG, CHOLHDL, LDLDIRECT in the last 72 hours. Thyroid Function Tests: No results for input(s): TSH, T4TOTAL, FREET4, T3FREE, THYROIDAB in the last 72 hours. Anemia Panel: No results for input(s): VITAMINB12, FOLATE, FERRITIN, TIBC, IRON, RETICCTPCT in the last 72 hours. Urine analysis:    Component Value Date/Time   COLORURINE YELLOW 07/21/2017 1001   APPEARANCEUR Sl Cloudy (A) 07/21/2017 1001   APPEARANCEUR Cloudy 12/14/2013 1727   LABSPEC >=1.030 (A) 07/21/2017 1001   LABSPEC 1.012 12/14/2013 1727   PHURINE 5.5 07/21/2017 1001   GLUCOSEU NEGATIVE 07/21/2017 1001   HGBUR NEGATIVE 07/21/2017 1001   BILIRUBINUR NEGATIVE 07/21/2017 1001   BILIRUBINUR Negative 12/14/2013 1727   KETONESUR NEGATIVE 07/21/2017 1001   PROTEINUR 30 (A) 03/29/2017 2254   UROBILINOGEN 0.2 07/21/2017 1001   NITRITE NEGATIVE 07/21/2017 1001   LEUKOCYTESUR NEGATIVE 07/21/2017 1001   LEUKOCYTESUR 3+ 12/14/2013 1727   Sepsis Labs: @LABRCNTIP (procalcitonin:4,lacticidven:4) )No results found for this or any previous visit (from the past 240 hour(s)).   Radiological Exams on Admission: Dg Chest 2 View  Result Date: 05/30/2019 CLINICAL DATA:  Patient reports fall from bed last night. Denies any pains. Reports sob and wheezing for 2 days. Denies any chest pains, cough, nvd, fever. EXAM: CHEST - 2 VIEW COMPARISON:  Chest radiograph 06/07/2018 FINDINGS: Stable cardiomediastinal contours with enlarged heart size. Aortic atherosclerotic calcification. Mild central vascular congestion without overt edema.  No focal pulmonary opacities to suggest infection. No pneumothorax or pleural effusion. No acute finding in the visualized skeleton. IMPRESSION: Cardiomegaly with mild central vascular congestion. No overt edema or evidence of infection. Electronically Signed   By: Audie Pinto M.D.   On: 05/30/2019 11:06    EKG: Independently reviewed.  Normal sinus rhythm.  Assessment/Plan Principal Problem:   Acute CHF (congestive heart failure) (HCC) Active Problems:   Hypothyroidism   DM (diabetes mellitus), type 2 with renal complications (HCC)   Anemia   Acute renal failure superimposed on stage 3 chronic kidney disease ([place])   Essential hypertension    1. Acute respiratory failure with hypoxia likely from CHF exacerbation last EF measured was 55 to 60% with grade 2 diastolic dysfunction and normal 2019.  Patient received 60 mg IV Lasix in the ER.  Based on the response rule out further doses.  Follow intake output metabolic panel daily weights.  Will check D-dimer and if positive will get VQ scan and Doppler of the lower extremity. 2. Hypertensive urgency likely contributing to patient's symptoms.  We will keep patient as needed IV hydralazine.  Continue home dose of amlodipine labetalol hydralazine p.o. Imdur and carvedilol. 3. Acute on chronic kidney disease stage III -creatinine has significantly worsened.  Follow FENa.  Renal ultrasound if creatinine does not further improve.  Patient did receive IV Lasix.  4. Fall.  Likely from weakness will follow CK levels.  Physical therapy consult.  Patient denies hitting her head or losing consciousness. 5. Diabetes mellitus type 2 -patient states she does not take any medication.  Check hemoglobin A1c.  Sliding scale coverage. 6. Hypothyroidism on Synthroid. 7. Normocytic normochromic anemia appears to be chronic likely from renal disease.  On iron supplements. 8. Leukocytosis could be reactionary.  Patient is afebrile.  Closely monitor.   Patient  has multiple comorbidities with shortness of breath fall will need  more than 2 midnight stay and inpatient status.   DVT prophylaxis: Heparin. Code Status: Full code. Family Communication: Discussed with patient. Disposition Plan: Home. Consults called: None. Admission status: Inpatient.   Rise Patience MD [facility] Pager [phone].  If 7PM-7AM, please contact night-coverage [email or web address] Password [email or web address]  05/31/2019, 12:17 AM

## 2019-05-31 NOTE — Progress Notes (Signed)
  PROGRESS NOTE  Patient admitted earlier this morning. See H&P. Taylor Tyler is a 78 y.o. female with history of diabetes mellitus type 2 presently not on medication, hypertension, chronic kidney disease stage III, anemia, hypothyroidism, hyperlipidemia, nonhealing wound of the low back after surgery was brought to the ER the patient had a fall at home.  Patient states on Sunday evening 2 days ago patient was trying to get out of the bed when she slid off the bed and fell onto the floor following which patient was not able to get up.  She was lying on the floor for at least 8 hours following which patient son came and knocked on the door.  Patient had to crawl and open the door.  Patient denies hitting her head or losing consciousness.  Over the last few weeks patient has been feeling increasingly short of breath.  Increasing lower extremity edema.  Patient seen in the emergency department.  States that her breathing is a little bit better since admission, continues to have bilateral lower extremity edema.   Acute hypoxemic respiratory failure -D-dimer 2.94 -Check VQ scan to rule out PE   Acute on chronic diastolic heart failure -Previous echocardiogram with EF 55 to 09%, grade 2 diastolic dysfunction -BNP 851.4 -CXR: Cardiomegaly with mild central vascular congestion. No overt edema or evidence of infection. -Hold lasix in setting of AKI    Hypertensive urgency -Improved -Amlodipine, labetalol, hydralazine, Imdur, catapres   Acute kidney injury on CKD stage III -Baseline creatinine 2.24 -FENa 7.64, ATN  -Renal ultrasound no worrisome renal lesions or hydronephrosis -Trend BMP  -Gentle IVF   Fall and weakness -CK 283 -PT OT eval   Diabetes mellitus type 2, well controlled  -Hemoglobin A1c 5.2 -Sliding scale insulin  Hypothyroidism -TSH elevated 5.569, free T4 normal 0.84.  Recommend repeating as an outpatient -Continue Synthroid  Hyperlipidemia -Continue Lipitor     Dessa Phi, DO [facility] 05/31/2019, 1:35 PM  Available via Epic secure chat 7am-7pm After these hours, please refer to coverage provider listed on [email or web address]

## 2019-05-31 NOTE — ED Notes (Signed)
Pt given orange juice and [place] sandwich

## 2019-05-31 NOTE — ED Notes (Signed)
ED TO INPATIENT HANDOFF REPORT  ED Nurse Name and Phone #: [phone]  S Name/Age/Gender Taylor Tyler 78 y.o. female Room/Bed: 058C/058C  Code Status   Code Status: Full Code  Home/SNF/Other Home Patient oriented to: self, place, time and situation Is this baseline? Yes   Triage Complete: Triage complete  Chief Complaint sob fall fluid overload  Triage Note Patient reports falling out of bed last night, states she tried to get up and felt weak - was on floor until her son helped her up this morning. She denies hitting her head or having any bodily injury or pain. She endorses shortness of breath and feeling "winded" for 2 days, slight cough, but denies chest pain or fevers/chills. Expiratory wheezing auscultated upper lobes, but diminished lower. States she has just felt generally weak lately.    Allergies Allergies  Allergen Reactions  . Clams [Shellfish Allergy] Swelling and Other (See Comments)    THROAT SWELLS NECK TURNS RED  . Hydralazine Other (See Comments)    CHEST TIGHTNESS Patient has tolerated multiple doses of hydralazine since allergy was listed   . Norvasc [Amlodipine Besylate] Swelling    Leg edema   . Milk-Related Compounds Diarrhea    Level of Care/Admitting Diagnosis ED Disposition    ED Disposition Condition Comment   Admit  Hospital Area: [facility] [[id number]]  Level of Care: Telemetry Cardiac [103]  Covid Evaluation: Asymptomatic Screening Protocol (No Symptoms)  Diagnosis: Acute CHF (congestive heart failure) [facility]) [[id number]]  Admitting Physician: Rise Patience [phone]  Attending Physician: Rise Patience [phone]  Estimated length of stay: past midnight tomorrow  Certification:: I certify this patient will need inpatient services for at least 2 midnights  PT Class (Do Not Modify): Inpatient [101]  PT Acc Code (Do Not Modify): Private [1]       B Medical/Surgery History Past Medical History:  Diagnosis Date  .  Acute blood loss anemia 03/14/2017  . Acute renal failure superimposed on stage 3 chronic kidney disease ([place]) 03/14/2017  . AKI (acute kidney injury) ([place]) 02/28/2017  . Altered mental status 03/29/2017  . Anemia   . Aortic atherosclerosis ([place])   . Arthritis    "joints might ache at times; not that bad" (06/10/2018)  . Bilateral lower extremity edema 03/04/2017  . Bradycardia   . Chronic kidney disease    ?? renal insufficiency,   . CKD (chronic kidney disease) stage 3, GFR 30-59 ml/min 01/15/2017   ?? renal insufficiency, which she thinks is coming from "all these medications"  . Diabetes mellitus without complication ([place])    diagnosed 4-5 yrs ago, 06/21/18- "that was years ago"  . Disease of pancreas   . Diverticulitis    s/p perforation and partial colectomy 01/27/14 with 3 benign lymph nodes   . Diverticulosis 01/15/2017  . DJD (degenerative joint disease)   . DM (diabetes mellitus), type 2 with renal complications ([place]) 8/50/2774  . Fatty liver   . Hypertension   . Hypothyroidism    "had radiation" (06/10/2018)  . Kidney stone   . Lethargy 02/28/2017  . Obesity, Class III, BMI 40-49.9 (morbid obesity) ([place]) 02/27/2017  . Pleural lipoma   . Postoperative wound infection 04/02/2017  . Spinal stenosis of lumbar region 01/15/2017  . Status post lumbar surgery   . Wound healing, delayed    back   Past Surgical History:  Procedure Laterality Date  . ABDOMINAL EXPOSURE N/A 02/24/2017   Procedure: ABDOMINAL EXPOSURE;  Surgeon: [name]  S, MD;  Location: [facility];  Service: Vascular;  Laterality: N/A;  . [place]   no h/o abnormal paps   . ANTERIOR LAT LUMBAR FUSION N/A 02/24/2017   Procedure: Lumbar three- five Anterior lateral lumbar interbody fusion;  Surgeon: Ditty, Kevan Ny, MD;  Location: [facility];  Service: Neurosurgery;  Laterality: N/A;  L3-5 Anterior lateral lumbar interbody fusion with removal of coflex at L3-4, L4-5  . ANTERIOR LUMBAR FUSION N/A  02/24/2017   Procedure: Stage 1: Lumbar five-Sacral one Anterior lumbar interbody fusion;  Surgeon: Ditty, Kevan Ny, MD;  Location: [facility];  Service: Neurosurgery;  Laterality: N/A;  Stage 1: L5-S1 Anterior lumbar interbody fusion  . APPENDECTOMY    . APPLICATION OF ROBOTIC ASSISTANCE FOR SPINAL PROCEDURE N/A 02/26/2017   Procedure: APPLICATION OF ROBOTIC ASSISTANCE FOR SPINAL PROCEDURE;  Surgeon: Ditty, Kevan Ny, MD;  Location: [facility];  Service: Neurosurgery;  Laterality: N/A;  . APPLICATION OF WOUND VAC N/A 03/30/2017   Procedure: APPLICATION OF WOUND VAC;  Surgeon: Ditty, Kevan Ny, MD;  Location: [facility];  Service: Neurosurgery;  Laterality: N/A;  . BACK SURGERY  2013  . CATARACT EXTRACTION W/ INTRAOCULAR LENS IMPLANT Right   . CHOLECYSTECTOMY OPEN  1982  . COLON SURGERY  01/26/2014   desc.sigmoid colectomy and ventral hernia repair and splenic flexure mobilization  . COLON SURGERY    . DILATION AND CURETTAGE OF UTERUS    . HERNIA REPAIR    . LUMBAR LAMINECTOMY WITH SPINOUS PROCESS PLATE 2 LEVEL N/A 1/88/4166   Procedure: LUMBAR LAMINECTOMY/DECOMPRESSION MICRODISCECTOMY CoFlex;  Surgeon: Faythe Ghee, MD;  [facility];  Service: Neurosurgery;  Laterality: N/A;  Lumbar three-four,Lumbar Four-Five Laminectomy with Coflex  . LUMBAR WOUND DEBRIDEMENT N/A 03/30/2017   Procedure: Lumbar wound exploration/debridement, placement of wound vac;  Surgeon: Ditty, Kevan Ny, MD;  Location: [facility];  Service: Neurosurgery;  Laterality: N/A;  Lumbar wound exploration/debridement, placement of wound vac  . LUMBAR WOUND DEBRIDEMENT N/A 06/06/2018   Procedure: LUMBAR WOUND DEBRIDEMENT/EXPLORATION;  Surgeon: Consuella Lose, MD;  Location: [facility];  Service: Neurosurgery;  Laterality: N/A;  . LUMBAR WOUND DEBRIDEMENT N/A 06/22/2018   Procedure: SIMPLE INCISION AND DRAINAGE OF WOUND, APPLICATION OF WOUND VAC;  Surgeon: Consuella Lose, MD;  Location: [facility];  Service: Neurosurgery;   Laterality: N/A;  . PARTIAL COLECTOMY     01/27/14 diverticulitis and 3 benign lymph nodes [facility] Dr. Pat Patrick   . REDUCTION MAMMAPLASTY Bilateral 1992  . TUBAL LIGATION    . VENTRAL HERNIA REPAIR  2014     A IV Location/Drains/Wounds Patient Lines/Drains/Airways Status   Active Line/Drains/Airways    Name:   Placement date:   Placement time:   Site:   Days:   Peripheral IV 05/30/19 Left Antecubital   05/30/19    2057    Antecubital   1   Closed System Drain 1 Posterior Back Accordion (Hemovac) 15 Fr.   06/06/18    0839    Back   359   Negative Pressure Wound Therapy Back Posterior   06/22/18    1116    -   343   External Urinary Catheter   06/09/18    2000    -   356   Incision (Closed) 06/06/18 Back   06/06/18    0907     359   Incision (Closed) 06/22/18 Back   06/22/18    1149     343   Wound / Incision (Open  or Dehisced) 06/04/18 Non-pressure wound Back Lower;Medial open wound, abscess where back surgery scar is located   06/04/18    0616    Back   361          Intake/Output Last 24 hours  Intake/Output Summary (Last 24 hours) at 05/31/2019 1330 Last data filed at 05/31/2019 0428 Gross per 24 hour  Intake -  Output 700 ml  Net -700 ml    Labs/Imaging Results for orders placed or performed during the hospital encounter of 05/30/19 (from the past 48 hour(s))  Basic metabolic panel     Status: Abnormal   Collection Time: 05/30/19 10:46 AM  Result Value Ref Range   Sodium 136 135 - 145 mmol/L   Potassium 5.3 (H) 3.5 - 5.1 mmol/L   Chloride 107 98 - 111 mmol/L   CO2 16 (L) 22 - 32 mmol/L   Glucose, Bld 103 (H) 70 - 99 mg/dL   BUN 55 (H) 8 - 23 mg/dL   Creatinine, Ser 5.74 (H) 0.44 - 1.00 mg/dL   Calcium 8.6 (L) 8.9 - 10.3 mg/dL   GFR calc non Af Amer 7 (L) >60 mL/min   GFR calc Af Amer 8 (L) >60 mL/min   Anion gap 13 5 - 15    Comment: Performed at [facility], [address] [place], [place] [address]  CBC     Status: Abnormal   Collection Time: 05/30/19 10:46 AM   Result Value Ref Range   WBC 14.9 (H) 4.0 - 10.5 K/uL   RBC 3.41 (L) 3.87 - 5.11 MIL/uL   Hemoglobin 8.9 (L) 12.0 - 15.0 g/dL   HCT 29.8 (L) 36.0 - 46.0 %   MCV 87.4 80.0 - 100.0 fL   MCH 26.1 26.0 - 34.0 pg   MCHC 29.9 (L) 30.0 - 36.0 g/dL   RDW 17.1 (H) 11.5 - 15.5 %   Platelets 436 (H) 150 - 400 K/uL   nRBC 0.0 0.0 - 0.2 %    Comment: Performed at [facility] [address] [place], [place] [address]  Troponin I (High Sensitivity)     Status: Abnormal   Collection Time: 05/30/19 10:46 AM  Result Value Ref Range   Troponin I (High Sensitivity) 38 (H) <18 ng/L    Comment: (NOTE) Elevated high sensitivity troponin I (hsTnI) values and significant  changes across serial measurements may suggest ACS but many other  chronic and acute conditions are known to elevate hsTnI results.  Refer to the "Links" section for chest pain algorithms and additional  guidance. Performed at [facility], [place] [address], [place], [place] [address]   Troponin I (High Sensitivity)     Status: Abnormal   Collection Time: 05/30/19  8:56 PM  Result Value Ref Range   Troponin I (High Sensitivity) 48 (H) <18 ng/L    Comment: (NOTE) Elevated high sensitivity troponin I (hsTnI) values and significant  changes across serial measurements may suggest ACS but many other  chronic and acute conditions are known to elevate hsTnI results.  Refer to the "Links" section for chest pain algorithms and additional  guidance. Performed at [facility], [place] [address], [place], [place] [address]   Brain natriuretic peptide     Status: Abnormal   Collection Time: 05/30/19  8:56 PM  Result Value Ref Range   B Natriuretic Peptide 851.4 (H) 0.0 - 100.0 pg/mL    Comment: Performed at [facility]  [address] [place], [place] [address]  CK     Status: Abnormal   Collection Time: 05/30/19  8:56 PM  Result Value Ref Range   Total CK 364 (H) 38 - 234 U/L    Comment: Performed at [facility], [place] [address] [place], [place] [address]  SARS CORONAVIRUS 2 (TAT 6-24 HRS) Nasopharyngeal Nasopharyngeal Swab     Status: None   Collection Time: 05/30/19 10:02 PM   Specimen: Nasopharyngeal Swab  Result Value Ref Range   SARS Coronavirus 2 NEGATIVE NEGATIVE    Comment: (NOTE) SARS-CoV-2 target nucleic acids are NOT DETECTED. The SARS-CoV-2 RNA is generally detectable in upper and lower respiratory specimens during the acute phase of infection. Negative results do not preclude SARS-CoV-2 infection, do not rule out co-infections with other pathogens, and should not be used as the sole basis for treatment or other patient management decisions. Negative results must be combined with clinical observations, patient history, and epidemiological information. The expected result is Negative. Fact Sheet for Patients: SugarRoll.be Fact Sheet for Healthcare Providers: https://www.woods-mathews.com/ This test is not yet approved or cleared by the [place] FDA and  has been authorized for detection and/or diagnosis of SARS-CoV-2 by FDA under an Emergency Use Authorization (EUA). This EUA will remain  in effect (meaning this test can be used) for the duration of the COVID-19 declaration under Section 56 4(b)(1) of the Act, 21 U.S.C. section 360bbb-3(b)(1), unless the authorization is terminated or revoked sooner. Performed at [facility], [place] [address], [place], [place] [address]   Hemoglobin A1c     Status: None   Collection Time: 05/31/19 12:23 AM  Result Value Ref Range   Hgb A1c MFr Bld 5.2 4.8 - 5.6 %    Comment: (NOTE) Pre diabetes:          5.7%-6.4% Diabetes:              >6.4% Glycemic control for   <7.0% adults with diabetes    Mean Plasma Glucose 102.54 mg/dL    Comment: Performed at [facility] [address] [place], [place] [address]  Urinalysis, Routine w reflex microscopic     Status: Abnormal    Collection Time: 05/31/19 12:29 AM  Result Value Ref Range   Color, Urine YELLOW YELLOW   APPearance CLEAR CLEAR   Specific Gravity, Urine 1.020 1.005 - 1.030   pH 6.0 5.0 - 8.0   Glucose, UA NEGATIVE NEGATIVE mg/dL   Hgb urine dipstick SMALL (A) NEGATIVE   Bilirubin Urine NEGATIVE NEGATIVE   Ketones, ur 15 (A) NEGATIVE mg/dL   Protein, ur >300 (A) NEGATIVE mg/dL   Nitrite NEGATIVE NEGATIVE   Leukocytes,Ua NEGATIVE NEGATIVE    Comment: Performed at [facility] [address] [place], [place] [address]  Sodium, urine, random     Status: None   Collection Time: 05/31/19 12:29 AM  Result Value Ref Range   Sodium, Ur 88 mmol/L    Comment: Performed at [facility] [address] [place], [place] [address]  Creatinine, urine, random     Status: None   Collection Time: 05/31/19 12:29 AM  Result Value Ref Range   Creatinine, Urine 49.05 mg/dL    Comment: Performed at [facility] [address], [place],  [address]  Urinalysis, Microscopic (reflex)     Status: Abnormal   Collection Time: 05/31/19 12:29 AM  Result Value Ref Range   RBC / HPF  0-5 0 - 5 RBC/hpf   WBC, UA 6-10 0 - 5 WBC/hpf   Bacteria, UA RARE (A) NONE SEEN   Squamous Epithelial / LPF 0-5 0 - 5    Comment: Performed at [facility], [place] [address] [place], [place] [address]  CBC     Status: Abnormal   Collection Time: 05/31/19 12:45 AM  Result Value Ref Range   WBC 14.7 (H) 4.0 - 10.5 K/uL   RBC 3.29 (L) 3.87 - 5.11 MIL/uL   Hemoglobin 8.7 (L) 12.0 - 15.0 g/dL   HCT 28.2 (L) 36.0 - 46.0 %   MCV 85.7 80.0 - 100.0 fL   MCH 26.4 26.0 - 34.0 pg   MCHC 30.9 30.0 - 36.0 g/dL   RDW 16.7 (H) 11.5 - 15.5 %   Platelets 462 (H) 150 - 400 K/uL   nRBC 0.0 0.0 - 0.2 %    Comment: Performed at [facility], [place] [address] [place], [place] [address]  Creatinine, serum     Status: Abnormal   Collection Time: 05/31/19 12:45 AM  Result Value Ref Range   Creatinine, Ser 6.13 (H) 0.44 -  1.00 mg/dL   GFR calc non Af Amer 6 (L) >60 mL/min   GFR calc Af Amer 7 (L) >60 mL/min    Comment: Performed at [facility] [address] [place], [place] [address]  Basic metabolic panel     Status: Abnormal   Collection Time: 05/31/19  4:08 AM  Result Value Ref Range   Sodium 136 135 - 145 mmol/L   Potassium 4.6 3.5 - 5.1 mmol/L   Chloride 108 98 - 111 mmol/L   CO2 16 (L) 22 - 32 mmol/L   Glucose, Bld 70 70 - 99 mg/dL   BUN 55 (H) 8 - 23 mg/dL   Creatinine, Ser 5.79 (H) 0.44 - 1.00 mg/dL   Calcium 8.7 (L) 8.9 - 10.3 mg/dL   GFR calc non Af Amer 7 (L) >60 mL/min   GFR calc Af Amer 8 (L) >60 mL/min   Anion gap 12 5 - 15    Comment: Performed at [facility], [place] [address], [place], [place] [address]  CBC     Status: Abnormal   Collection Time: 05/31/19  4:08 AM  Result Value Ref Range   WBC 14.7 (H) 4.0 - 10.5 K/uL   RBC 3.15 (L) 3.87 - 5.11 MIL/uL   Hemoglobin 8.3 (L) 12.0 - 15.0 g/dL   HCT 27.7 (L) 36.0 - 46.0 %   MCV 87.9 80.0 - 100.0 fL   MCH 26.3 26.0 - 34.0 pg   MCHC 30.0 30.0 - 36.0 g/dL   RDW 17.0 (H) 11.5 - 15.5 %   Platelets 468 (H) 150 - 400 K/uL   nRBC 0.0 0.0 - 0.2 %    Comment: Performed at [facility], [place] [address] [place], [place] [address]  TSH     Status: Abnormal   Collection Time: 05/31/19  4:08 AM  Result Value Ref Range   TSH 5.569 (H) 0.350 - 4.500 uIU/mL    Comment: Performed by a 3rd Generation assay with a functional sensitivity of <=0.01 uIU/mL. Performed at [facility], [place] [address] [place], [place] [address]   CK     Status: Abnormal   Collection Time: 05/31/19  6:35 AM  Result Value Ref Range   Total CK 283 (H) 38 - 234 U/L  Comment: Performed at [facility], [place] [address], [place], [place] [address]  Hepatic function panel     Status: Abnormal   Collection Time: 05/31/19  6:35 AM  Result Value Ref Range   Total Protein 7.2 6.5 - 8.1 g/dL   Albumin 2.0 (L) 3.5 - 5.0 g/dL   AST 27 15 - 41 U/L    ALT 16 0 - 44 U/L   Alkaline Phosphatase 51 38 - 126 U/L   Total Bilirubin 0.5 0.3 - 1.2 mg/dL   Bilirubin, Direct <0.1 0.0 - 0.2 mg/dL   Indirect Bilirubin NOT CALCULATED 0.3 - 0.9 mg/dL    Comment: Performed at [facility] [address] [place], [place] [address]  D-dimer, quantitative (not at [facility])     Status: Abnormal   Collection Time: 05/31/19  6:35 AM  Result Value Ref Range   D-Dimer, Quant 2.94 (H) 0.00 - 0.50 ug/mL-FEU    Comment: (NOTE) At the manufacturer cut-off of 0.50 ug/mL FEU, this assay has been documented to exclude PE with a sensitivity and negative predictive value of 97 to 99%.  At this time, this assay has not been approved by the FDA to exclude DVT/VTE. Results should be correlated with clinical presentation. Performed at [facility], [place] [address] [place], [place] [address]   T4, free     Status: None   Collection Time: 05/31/19  8:04 AM  Result Value Ref Range   Free T4 0.84 0.61 - 1.12 ng/dL    Comment: (NOTE) Biotin ingestion may interfere with free T4 tests. If the results are inconsistent with the TSH level, previous test results, or the clinical presentation, then consider biotin interference. If needed, order repeat testing after stopping biotin. Performed at [facility], [place] [address] [place], [place] [address]   CBG monitoring, ED     Status: None   Collection Time: 05/31/19  8:15 AM  Result Value Ref Range   Glucose-Capillary 70 70 - 99 mg/dL  CBG monitoring, ED     Status: Abnormal   Collection Time: 05/31/19 12:06 PM  Result Value Ref Range   Glucose-Capillary 65 (L) 70 - 99 mg/dL   Dg Chest 2 View  Result Date: 05/30/2019 CLINICAL DATA:  Patient reports fall from bed last night. Denies any pains. Reports sob and wheezing for 2 days. Denies any chest pains, cough, nvd, fever. EXAM: CHEST - 2 VIEW COMPARISON:  Chest radiograph 06/07/2018 FINDINGS: Stable cardiomediastinal contours with enlarged heart size.  Aortic atherosclerotic calcification. Mild central vascular congestion without overt edema. No focal pulmonary opacities to suggest infection. No pneumothorax or pleural effusion. No acute finding in the visualized skeleton. IMPRESSION: Cardiomegaly with mild central vascular congestion. No overt edema or evidence of infection. Electronically Signed   By: Audie Pinto M.D.   On: 05/30/2019 11:06   US Renal  Result Date: 05/31/2019 CLINICAL DATA:  Renal insufficiency. EXAM: RENAL / URINARY TRACT ULTRASOUND COMPLETE COMPARISON:  CT scan 03/29/2017 and prior renal ultrasound 06/04/2018 FINDINGS: Right Kidney: Renal measurements: 10.9 x 5.7 x 5.1 cm = volume: 165.7 mL. Diffuse increased echogenicity and renal cortical thinning but no focal lesions or hydronephrosis. Left Kidney: Renal measurements: 12.5 x 6.0 x 5.3 cm = volume: 209.3 mL. Diffuse increased echogenicity and renal cortical thinning but no focal lesions or hydronephrosis. Bladder: Appears normal for degree of bladder distention. Other: None. IMPRESSION: 1. Stable echogenic and mildly atrophic kidneys consistent with medical renal disease. 2.  No worrisome renal lesions or hydronephrosis. Electronically Signed   By: Marijo Sanes M.D.   On: 05/31/2019 11:04    Pending Labs Unresulted Labs (From admission, onward)   None      Vitals/Pain Today's Vitals   05/31/19 1045 05/31/19 1115 05/31/19 1130 05/31/19 1200  BP: (!) 184/57 (!) 159/60 (!) 160/58 (!) 155/54  Pulse: 75 70 69 76  Resp: 18 13 15 14   Temp:      TempSrc:      SpO2: 99% 97% 99% 97%  PainSc:        Isolation Precautions No active isolations  Medications Medications  amLODipine (NORVASC) tablet 10 mg (10 mg Oral Given 05/31/19 1016)  atorvastatin (LIPITOR) tablet 40 mg (has no administration in time range)  hydrALAZINE (APRESOLINE) tablet 50 mg (50 mg Oral Given 05/31/19 0416)  isosorbide mononitrate (IMDUR) 24 hr tablet 30 mg (30 mg Oral Given 05/31/19 1016)   labetalol (NORMODYNE) tablet 100 mg (100 mg Oral Given 05/31/19 1016)  levothyroxine (SYNTHROID) tablet 100 mcg (100 mcg Oral Given 05/31/19 0807)  ferrous sulfate tablet 325 mg (325 mg Oral Given 05/31/19 0807)  gabapentin (NEURONTIN) capsule 300 mg (300 mg Oral Given 05/31/19 0039)  acetaminophen (TYLENOL) tablet 650 mg (has no administration in time range)    Or  acetaminophen (TYLENOL) suppository 650 mg (has no administration in time range)  ondansetron (ZOFRAN) tablet 4 mg (has no administration in time range)    Or  ondansetron (ZOFRAN) injection 4 mg (has no administration in time range)  heparin injection 5,000 Units (5,000 Units Subcutaneous Given 05/31/19 0600)  insulin aspart (novoLOG) injection 0-9 Units (0 Units Subcutaneous Not Given 05/31/19 1224)  hydrALAZINE (APRESOLINE) injection 10 mg (10 mg Intravenous Given 05/31/19 0651)  sodium chloride flush (NS) 0.9 % injection 3 mL (3 mLs Intravenous Given 05/30/19 2058)  albuterol (VENTOLIN HFA) 108 (90 Base) MCG/ACT inhaler 4 puff (4 puffs Inhalation Given 05/30/19 1049)  furosemide (LASIX) injection 60 mg (60 mg Intravenous Given 05/30/19 2059)    Mobility walks with device High fall risk   Focused Assessments Pulmonary Assessment Handoff:  Lung sounds:   O2 Device: Room Air        R Recommendations: See Admitting Provider Note  Report given to:   Additional Notes:

## 2019-05-31 NOTE — ED Notes (Signed)
Dr. Hal Hope notified about bp 195/70. Told to give 50mg  hydralazine early.

## 2019-05-31 NOTE — Progress Notes (Signed)
PT Cancellation Note  Patient Details Name: Taylor Tyler MRN: [id number] DOB: July 25, 1941   Cancelled Treatment:    Reason Eval/Treat Not Completed: Other (comment) currently awaiting VQ scan to rule out PE. Will attempt to return when patient is appropriate and time/schedule allow.    Deniece Ree PT, DPT, CBIS  Supplemental Physical Therapist [facility]    Pager [phone] Acute Rehab Office [phone]

## 2019-05-31 NOTE — ED Notes (Addendum)
Patient transported to Ultrasound 

## 2019-05-31 NOTE — ED Notes (Signed)
Pt assisted to restroom with wheelchair. Able to pivot from bed to chair, chair to commode with rail support.

## 2019-05-31 NOTE — ED Notes (Signed)
Pt transferred to hospital bed. Resting comfortably. Asked pt if she would like to change her underwear d/t it being wet she refused.

## 2019-05-31 NOTE — Consult Note (Signed)
[facility] Nurse wound consult note Reason for Consult:Nonhealing wound to lumbar spine.  Has been I & D in the past.   Wound type:surgical, infectious Pressure Injury POA: NA Measurement: 3 cm scar with nonintact center. Unable to appreciate depth with cotton tip applicator.  Will continue with dry dressing.  Wound bed:not visible Drainage (amount, consistency, odor) minimal purulence Periwound:scarring, intact Dressing procedure/placement/frequency: Cleanse spine wound with NS and pat dry. Apply foam dressing to cover.  Change every three days and PRN soilage.  Will not follow at this time.  Please re-consult if needed.  Domenic Moras MSN, RN, FNP-BC CWON Wound, Ostomy, Continence Nurse Pager [phone]

## 2019-05-31 NOTE — ED Notes (Signed)
Lunch tray at bedside. ?

## 2019-06-01 ENCOUNTER — Inpatient Hospital Stay (HOSPITAL_COMMUNITY)
Admission: RE | Admit: 2019-06-01 | Discharge: 2019-06-01 | Disposition: A | Discharge status: Home / Self Care | Payer: Medicare Other | Source: Ambulatory Visit | Attending: Internal Medicine | Admitting: Internal Medicine

## 2019-06-01 LAB — GLUCOSE, CAPILLARY
Glucose-Capillary: 94 mg/dL (ref 70–99)
Glucose-Capillary: 104 mg/dL — ABNORMAL HIGH (ref 70–99)
Glucose-Capillary: 132 mg/dL — ABNORMAL HIGH (ref 70–99)
Glucose-Capillary: 109 mg/dL — ABNORMAL HIGH (ref 70–99)

## 2019-06-01 LAB — CBC
HCT: 25.6 % — ABNORMAL LOW (ref 36.0–46.0)
Hemoglobin: 7.7 g/dL — ABNORMAL LOW (ref 12.0–15.0)
MCH: 25.8 pg — ABNORMAL LOW (ref 26.0–34.0)
MCHC: 30.1 g/dL (ref 30.0–36.0)
MCV: 85.9 fL (ref 80.0–100.0)
Platelets: 435 10*3/uL — ABNORMAL HIGH (ref 150–400)
RBC: 2.98 MIL/uL — ABNORMAL LOW (ref 3.87–5.11)
RDW: 16.5 % — ABNORMAL HIGH (ref 11.5–15.5)
WBC: 12.8 10*3/uL — ABNORMAL HIGH (ref 4.0–10.5)
nRBC: 0 % (ref 0.0–0.2)

## 2019-06-01 LAB — BASIC METABOLIC PANEL
Anion gap: 10 (ref 5–15)
BUN: 54 mg/dL — ABNORMAL HIGH (ref 8–23)
CO2: 18 mmol/L — ABNORMAL LOW (ref 22–32)
Calcium: 8.2 mg/dL — ABNORMAL LOW (ref 8.9–10.3)
Chloride: 108 mmol/L (ref 98–111)
Creatinine, Ser: 5.68 mg/dL — ABNORMAL HIGH (ref 0.44–1.00)
GFR calc Af Amer: 8 mL/min — ABNORMAL LOW (ref >60)
GFR calc non Af Amer: 7 mL/min — ABNORMAL LOW (ref >60)
Glucose, Bld: 91 mg/dL (ref 70–99)
Potassium: 4.3 mmol/L (ref 3.5–5.1)
Sodium: 136 mmol/L (ref 135–145)

## 2019-06-01 MED ORDER — FUROSEMIDE 10 MG/ML IJ SOLN
40.0000 mg | Freq: Every day | INTRAMUSCULAR | Status: DC
  Administered 2019-06-01 – 2019-06-02 (×2): 40 mg via INTRAVENOUS
  Filled 2019-06-01 (×2): qty 4

## 2019-06-01 NOTE — Progress Notes (Signed)
Patient's chronic back wound not healing, it was draining some serous fluid during shift change. Foam is changed and gauze is put in to absorb drainage.  Taylor Tyler will paged MD.

## 2019-06-01 NOTE — Progress Notes (Addendum)
PROGRESS NOTE    Taylor Tyler  XFG:[id number] DOB: February 03, 1941 DOA: 05/30/2019 PCP: McLean-Scocuzza, Nino Glow, MD     Brief Narrative:  Taylor Tyler is a 78 y.o. female with history of diabetes mellitus type 2 presently not on medication, hypertension, chronic kidney disease stage 4, anemia, hypothyroidism, hyperlipidemia, nonhealing wound of the low back after surgery was brought to the ER the patient had a fall at home.  Patient states on Sunday evening 2 days ago patient was trying to get out of the bed when she slid off the bed and fell onto the floor following which patient was not able to get up.  She was lying on the floor for at least 8 hours following which patient son came and knocked on the door.  Patient had to crawl and open the door.  Patient denies hitting her head or losing consciousness.  Over the last few weeks patient has been feeling increasingly short of breath with increasing lower extremity edema. She was admitted for acute on chronic diastolic heart failure.  New events last 24 hours / Subjective: Feeling well today, wants to go home.  Continues to have lower extremity edema bilaterally.  Assessment & Plan:   Principal Problem:   Acute CHF (congestive heart failure) (HCC) Active Problems:   Hypothyroidism   DM (diabetes mellitus), type 2 with renal complications (HCC)   Anemia   Acute renal failure superimposed on stage 3 chronic kidney disease (HCC)   Essential hypertension   Acute hypoxemic respiratory failure -D-dimer 2.94. VQ scan low risk for PE.  -On room air this morning    Acute on chronic diastolic heart failure -Previous echocardiogram with EF 55 to 96%, grade 2 diastolic dysfunction -BNP 851.4 -CXR: Cardiomegaly with mild central vascular congestion. No overt edema or evidence of infection. -Start IV Lasix and monitor BMP closely -Strict I's and O's, daily weight   Hypertensive urgency -Continue amlodipine, labetalol, hydralazine, Imdur,  catapres, lasix  -Improved    Acute kidney injury on CKD stage IV -Baseline creatinine 2.24, eGFR 20s  -FENa 7.64, ATN  -Renal ultrasound no worrisome renal lesions or hydronephrosis -Trend BMP while on IV lasix    Fall and weakness -CK 283 -PT OT eval, no PT follow up recommended    Diabetes mellitus type 2, well controlled  -Hemoglobin A1c 5.2 -Sliding scale insulin   Hypothyroidism -TSH elevated 5.569, free T4 normal 0.84.  Recommend repeating as an outpatient -Continue Synthroid   Hyperlipidemia -Continue Lipitor     DVT prophylaxis: Subq hep Code Status: Full Family Communication: None at bedside Disposition Plan: Pending improvement in LE edema, Cr   Consultants:  None  Procedures:  None   Antimicrobials:  Anti-infectives (From admission, onward)    None        Objective: Vitals:   05/31/19 2310 06/01/19 0458 06/01/19 0459 06/01/19 0902  BP: (!) 150/55  (!) 164/66 (!) 152/61  Pulse: 65  66 64  Resp: 17  18   Temp: 98.5 F (36.9 C)  98.1 F (36.7 C)   TempSrc: Oral  Oral   SpO2: 98%  100%   Weight:  97.9 kg    Height:        Intake/Output Summary (Last 24 hours) at 06/01/2019 1043 Last data filed at 06/01/2019 0300 Gross per 24 hour  Intake 508.07 ml  Output   Net 508.07 ml   Filed Weights   05/31/19 1434 06/01/19 0458  Weight: 98.8 kg 97.9 kg  Examination:  General exam: Appears calm and comfortable  Respiratory system: Clear to auscultation. Respiratory effort normal. No respiratory distress. No conversational dyspnea. On room air   Cardiovascular system: S1 & S2 heard, RRR. No murmurs. +Bilateral LE pedal edema. Gastrointestinal system: Abdomen is nondistended, soft and nontender. Normal bowel sounds heard. Central nervous system: Alert and oriented. No focal neurological deficits. Speech clear.  Extremities: Symmetric in appearance  Skin: No rashes, lesions or ulcers on exposed skin  Psychiatry: Judgement and insight appear  normal. Mood & affect appropriate.   Data Reviewed: I have personally reviewed following labs and imaging studies  CBC: Recent Labs  Lab 05/30/19 1046 05/31/19 0045 05/31/19 0408 06/01/19 0404  WBC 14.9* 14.7* 14.7* 12.8*  HGB 8.9* 8.7* 8.3* 7.7*  HCT 29.8* 28.2* 27.7* 25.6*  MCV 87.4 85.7 87.9 85.9  PLT 436* 462* 468* 782*   Basic Metabolic Panel: Recent Labs  Lab 05/30/19 1046 05/31/19 0045 05/31/19 0408 06/01/19 0404  NA 136  --  136 136  K 5.3*  --  4.6 4.3  CL 107  --  108 108  CO2 16*  --  16* 18*  GLUCOSE 103*  --  70 91  BUN 55*  --  55* 54*  CREATININE 5.74* 6.13* 5.79* 5.68*  CALCIUM 8.6*  --  8.7* 8.2*   GFR: Estimated Creatinine Clearance: 9.4 mL/min (A) (by C-G formula based on SCr of 5.68 mg/dL (H)). Liver Function Tests: Recent Labs  Lab 05/31/19 0635  AST 27  ALT 16  ALKPHOS 51  BILITOT 0.5  PROT 7.2  ALBUMIN 2.0*   No results for input(s): LIPASE, AMYLASE in the last 168 hours. No results for input(s): AMMONIA in the last 168 hours. Coagulation Profile: No results for input(s): INR, PROTIME in the last 168 hours. Cardiac Enzymes: Recent Labs  Lab 05/30/19 2056 05/31/19 0635  CKTOTAL 364* 283*   BNP (last 3 results) No results for input(s): PROBNP in the last 8760 hours. HbA1C: Recent Labs    05/31/19 0023  HGBA1C 5.2   CBG: Recent Labs  Lab 05/31/19 0815 05/31/19 1206 05/31/19 1806 05/31/19 2123 06/01/19 0506  GLUCAP 70 65* 93 101* 94   Lipid Profile: No results for input(s): CHOL, HDL, LDLCALC, TRIG, CHOLHDL, LDLDIRECT in the last 72 hours. Thyroid Function Tests: Recent Labs    05/31/19 0408 05/31/19 0804  TSH 5.569*  --   FREET4  --  0.84   Anemia Panel: No results for input(s): VITAMINB12, FOLATE, FERRITIN, TIBC, IRON, RETICCTPCT in the last 72 hours. Sepsis Labs: No results for input(s): PROCALCITON, LATICACIDVEN in the last 168 hours.  Recent Results (from the past 240 hour(s))  SARS CORONAVIRUS 2 (TAT  6-24 HRS) Nasopharyngeal Nasopharyngeal Swab     Status: None   Collection Time: 05/30/19 10:02 PM   Specimen: Nasopharyngeal Swab  Result Value Ref Range Status   SARS Coronavirus 2 NEGATIVE NEGATIVE Final    Comment: (NOTE) SARS-CoV-2 target nucleic acids are NOT DETECTED. The SARS-CoV-2 RNA is generally detectable in upper and lower respiratory specimens during the acute phase of infection. Negative results do not preclude SARS-CoV-2 infection, do not rule out co-infections with other pathogens, and should not be used as the sole basis for treatment or other patient management decisions. Negative results must be combined with clinical observations, patient history, and epidemiological information. The expected result is Negative. Fact Sheet for Patients: SugarRoll.be Fact Sheet for Healthcare Providers: https://www.woods-mathews.com/ This test is not yet approved or cleared by  the [employer] and  has been authorized for detection and/or diagnosis of SARS-CoV-2 by FDA under an Emergency Use Authorization (EUA). This EUA will remain  in effect (meaning this test can be used) for the duration of the COVID-19 declaration under Section 56 4(b)(1) of the Act, 21 U.S.C. section 360bbb-3(b)(1), unless the authorization is terminated or revoked sooner. Performed at [facility], [place] [address] [place], [place] [address]       Radiology Studies: Dg Chest 2 View  Result Date: 05/30/2019 CLINICAL DATA:  Patient reports fall from bed last night. Denies any pains. Reports sob and wheezing for 2 days. Denies any chest pains, cough, nvd, fever. EXAM: CHEST - 2 VIEW COMPARISON:  Chest radiograph 06/07/2018 FINDINGS: Stable cardiomediastinal contours with enlarged heart size. Aortic atherosclerotic calcification. Mild central vascular congestion without overt edema. No focal pulmonary opacities to suggest infection. No pneumothorax or  pleural effusion. No acute finding in the visualized skeleton. IMPRESSION: Cardiomegaly with mild central vascular congestion. No overt edema or evidence of infection. Electronically Signed   By: Audie Pinto M.D.   On: 05/30/2019 11:06   US Renal  Result Date: 05/31/2019 CLINICAL DATA:  Renal insufficiency. EXAM: RENAL / URINARY TRACT ULTRASOUND COMPLETE COMPARISON:  CT scan 03/29/2017 and prior renal ultrasound 06/04/2018 FINDINGS: Right Kidney: Renal measurements: 10.9 x 5.7 x 5.1 cm = volume: 165.7 mL. Diffuse increased echogenicity and renal cortical thinning but no focal lesions or hydronephrosis. Left Kidney: Renal measurements: 12.5 x 6.0 x 5.3 cm = volume: 209.3 mL. Diffuse increased echogenicity and renal cortical thinning but no focal lesions or hydronephrosis. Bladder: Appears normal for degree of bladder distention. Other: None. IMPRESSION: 1. Stable echogenic and mildly atrophic kidneys consistent with medical renal disease. 2. No worrisome renal lesions or hydronephrosis. Electronically Signed   By: Marijo Sanes M.D.   On: 05/31/2019 11:04   Nm Pulmonary Perf And Vent  Result Date: 05/31/2019 CLINICAL DATA:  Inpatient.  Elevated D-dimer.  Dyspnea.  Fall. EXAM: NUCLEAR MEDICINE VENTILATION - PERFUSION LUNG SCAN TECHNIQUE: Ventilation images were obtained in multiple projections using inhaled aerosol Tc-22mDTPA. Perfusion images were obtained in multiple projections after intravenous injection of Tc-939mAA. RADIOPHARMACEUTICALS:  38.7 mCi of Tc-9929mPA aerosol inhalation and 1.5 mCi Tc99m20m IV COMPARISON:  Chest radiograph from one day prior. FINDINGS: Ventilation: No focal right lung ventilation defect. Small matched posterior upper left lung ventilation defect. Perfusion: No focal right lung perfusion defect. Small matched posterior upper left lung perfusion defect. IMPRESSION: Low probability for acute pulmonary embolism (10-19%). Electronically Signed   By: JasoIlona Sorrel.    On: 05/31/2019 19:37      Scheduled Meds:  amLODipine  10 mg Oral Daily   atorvastatin  40 mg Oral q1800   cloNIDine  0.1 mg Oral BID   ferrous sulfate  325 mg Oral Q breakfast   gabapentin  300 mg Oral QHS   heparin  5,000 Units Subcutaneous Q8H   hydrALAZINE  50 mg Oral Q8H   insulin aspart  0-9 Units Subcutaneous TID WC   labetalol  100 mg Oral BID   levothyroxine  100 mcg Oral QAC breakfast   Continuous Infusions:    LOS: 2 days      Time spent: 25 minutes   JennDessa Phi [facility] 06/01/2019, 10:43 AM   Available via Epic secure chat 7am-7pm After these hours, please refer to coverage provider listed on [email or web address]

## 2019-06-01 NOTE — Consult Note (Signed)
   [facility] Inpatient Consult   06/01/2019  Taylor Tyler [birth year] [id number]   Patient screened for high risk score for unplanned readmission score  andfor hospitalizations to check if potential [facility] services are needed.  Patient has previously been outreached by a [facility].  Review of patient's medical record reveals patient is from brief summary notes which includes but not limited to:  Taylor Tyler a 78 y.o.femalewithhistory of diabetes mellitus type 2 presently not on medication, hypertension, chronic kidney disease stage 4, anemia, hypothyroidism, hyperlipidemia, nonhealing wound of the low back after surgery was brought to the ER the patient had a fall at home. Patient states on Sunday evening 2 days ago patient was trying to get out of the bed when she slid off the bed and fell onto the floor following which patient was not able to get up. She was lying on the floor for at least 8 hours following which patient son came and knocked on the door. Patient had to crawl and open the door. Patient denies hitting her head or losing consciousness. Over the last few weeks patient has been feeling increasingly short of breath with increasing lower extremity edema. She was admitted for acute on chronic diastolic heart failure.  Primary Care Provider is McLean-Scocuzza, Nino Glow, MD, [facility], this office provides the transition of care follow up.  Plan:  Will follow up with inpatient [facility] team for disposition and needs to assess for post hospital care management needs.     Please place a [facility] consult as appropriate and for questions contact:   Natividad Brood, RN BSN [facility] Liaison  [phone] business mobile phone Toll free office [phone]  Fax number: [phone] [name]

## 2019-06-01 NOTE — Evaluation (Signed)
Occupational Therapy Evaluation Patient Details Name: Taylor Tyler MRN: [id number] DOB: 07-16-1941 Today's Date: 06/01/2019    History of Present Illness Pt is a 78 y.o. female admitted 05/30/19 after fall at home, laying on the floor at least 8 hours until found by son, also with worsening SOB and LE edema. VQ scan with low probability for PE. Worked up for HF. Other PMH includes CKD III, DM2, DJD s/p lumbar sx (2018), arthritis.   Clinical Impression   PTA pt independent and driving. Admitted for above and limited by problem list below, including impaired balance, decreased activity tolerance, and impaired cognition.  She currently requires min guard to close supervision in room mobility using RW, supervision for transfers using RW, min-min guard assist for LB ADLs.  Short blessed test completed, pt scoring 10/28 revealing deficits in STM.  Recommend further assessment using pill box test, as pt completes her own med mgmt and IADls.  Reports family support intermittently, "in and out all day", but recommend 24/7 support initially. She will benefit from further OT services while admitted and after dc at [facility] level in order to maximize independence and safety with ADLs, mobility, and IADls.     Follow Up Recommendations  Home health OT;Supervision/Assistance - 24 hour    Equipment Recommendations  None recommended by OT    Recommendations for Other Services       Precautions / Restrictions Precautions Precautions: Fall Restrictions Weight Bearing Restrictions: No      Mobility Bed Mobility               General bed mobility comments: Received sitting in recliner  Transfers Overall transfer level: Needs assistance Equipment used: Rolling walker (2 wheeled);None Transfers: Sit to/from Stand Sit to Stand: Supervision         General transfer comment: supervision for safety, cueing for hand placement     Balance Overall balance assessment: Needs assistance   Sitting  balance-Leahy Scale: Good     Standing balance support: Bilateral upper extremity supported;No upper extremity supported;During functional activity Standing balance-Leahy Scale: Fair                             ADL either performed or assessed with clinical judgement   ADL Overall ADL's : Needs assistance/impaired     Grooming: Supervision/safety;Standing   Upper Body Bathing: Set up;Sitting   Lower Body Bathing: Min guard;Sit to/from stand Lower Body Bathing Details (indicate cue type and reason): dynamically min guard, cueing to recall need to wash LEs (from incontinent BM earlier -per pt report) Upper Body Dressing : Set up;Sitting   Lower Body Dressing: Minimal assistance;Sit to/from stand Lower Body Dressing Details (indicate cue type and reason): decreased reach and assist with socks, supervision sit to stand  Toilet Transfer: Supervision/safety;Ambulation;RW           Functional mobility during ADLs: Supervision/safety;Min guard;Rolling walker General ADL Comments: pt limited by impaired balance, decreased activity tolerance and impaired cognition      Vision   Vision Assessment?: No apparent visual deficits     Perception     Praxis      Pertinent Vitals/Pain Pain Assessment: No/denies pain     Hand Dominance     Extremity/Trunk Assessment Upper Extremity Assessment Upper Extremity Assessment: Generalized weakness   Lower Extremity Assessment Lower Extremity Assessment: Defer to PT evaluation       Communication Communication Communication: No difficulties   Cognition Arousal/Alertness: Awake/alert  Behavior During Therapy: WFL for tasks assessed/performed Overall Cognitive Status: Impaired/Different from baseline Area of Impairment: Memory;Attention;Problem solving                   Current Attention Level: Sustained Memory: Decreased short-term memory       Problem Solving: Slow processing;Requires verbal cues General  Comments: Short blessed test completed: 10/28 (impairment consistent with dementia) with deficits mainly noted in STM and recommend futher assessment as pt lives alone and manages her own meds   General Comments  Discussed fall risk reduction/safety strategies for home    Exercises     Shoulder Instructions      Home Living Family/patient expects to be discharged to:: Private residence Living Arrangements: Alone Available Help at Discharge: Family;Neighbor;Available PRN/intermittently Type of Home: House Home Access: Stairs to enter [employer] of Steps: 1 Entrance Stairs-Rails: None Home Layout: One level     Bathroom Shower/Tub: Teacher, early years/pre: Handicapped height     Home Equipment: Environmental consultant - 2 wheels;Cane - single point   Additional Comments: patient basin bathing only      Prior Functioning/Environment Level of Independence: Independent with assistive device(s)        Comments: Intermittent use of RW or SPC; indep ADls, IADls, med mgmt and drives. Reports fall leading to admission was first fall in a while, was able to crawl to phone to call son        OT Problem List: Decreased activity tolerance;Impaired balance (sitting and/or standing);Decreased safety awareness;Decreased cognition;Decreased knowledge of use of DME or AE;Decreased knowledge of precautions;Obesity      OT Treatment/Interventions: Self-care/ADL training;DME and/or AE instruction;Therapeutic activities;Cognitive remediation/compensation;Balance training;Patient/family education    OT Goals(Current goals can be found in the care plan section) Acute Rehab OT Goals Patient Stated Goal: Return home OT Goal Formulation: With patient Time For Goal Achievement: 06/15/19 Potential to Achieve Goals: Good  OT Frequency: Min 2X/week   Barriers to D/C:            Co-evaluation              AM-PAC OT "6 Clicks" Daily Activity     Outcome Measure Help from another  person eating meals?: None Help from another person taking care of personal grooming?: A Little Help from another person toileting, which includes using toliet, bedpan, or urinal?: A Little Help from another person bathing (including washing, rinsing, drying)?: A Little Help from another person to put on and taking off regular upper body clothing?: A Little Help from another person to put on and taking off regular lower body clothing?: A Little 6 Click Score: 19   End of Session Equipment Utilized During Treatment: Rolling walker Nurse Communication: Mobility status  Activity Tolerance: Patient tolerated treatment well Patient left: in chair;with call bell/phone within reach;with chair alarm set  OT Visit Diagnosis: Unsteadiness on feet (R26.81);Other symptoms and signs involving cognitive function                Time: 5188-4166 OT Time Calculation (min): 22 min Charges:  OT General Charges $OT Visit: 1 Visit OT Evaluation $OT Eval Moderate Complexity: [place], OT Acute Rehabilitation Services Pager [phone] Office [phone]   Delight Stare 06/01/2019, 1:08 PM

## 2019-06-01 NOTE — Evaluation (Signed)
Physical Therapy Evaluation Patient Details Name: Taylor Tyler MRN: [id number] DOB: [birth year] Today's Date: 06/01/2019   History of Present Illness  Pt is a 78 y.o. female admitted 05/30/19 after fall at home, laying on the floor at least 8 hours until found by son, also with worsening SOB and LE edema. VQ scan with low probability for PE. Worked up for HF. Other PMH includes CKD III, DM2, DJD s/p lumbar sx (2018), arthritis.    Clinical Impression  Pt presents with an overall decrease in functional mobility secondary to above. PTA, pt independent, lives alone, drives; multiple supportive family members nearby to assist as needed. Today, pt ambulating well, stability improved with RW; limited by decreased activity tolerance. SpO2 96% on RA, HR 73 post-walk. Educ on fall risk reduction strategies. Pt would benefit from continued acute PT services to maximize functional mobility and independence prior to d/c home.     Follow Up Recommendations No PT follow up;Supervision - Intermittent    Equipment Recommendations  None recommended by PT    Recommendations for Other Services       Precautions / Restrictions Precautions Precautions: Fall Restrictions Weight Bearing Restrictions: No      Mobility  Bed Mobility               General bed mobility comments: Received sitting in recliner  Transfers Overall transfer level: Needs assistance Equipment used: Rolling walker (2 wheeled);None Transfers: Sit to/from Stand Sit to Stand: Supervision         General transfer comment: Supervision to stand from recliner; required use of hand rail to pull up from low toilet seat  Ambulation/Gait Ambulation/Gait assistance: Min guard;Supervision Gait Distance (Feet): 120 Feet Assistive device: None;Rolling walker (2 wheeled) Gait Pattern/deviations: Step-through pattern;Decreased stride length Gait velocity: Decreased Gait velocity interpretation: 1.31 - 2.62 ft/sec, indicative of  limited community ambulator General Gait Details: Slow gait in room without DME, pt reaching to furniture then [place] for added support. Additional ambulation with RW, stability improved, supervision for safety  Stairs            Wheelchair Mobility    Modified Rankin (Stroke Patients Only)       Balance Overall balance assessment: Needs assistance   Sitting balance-Leahy Scale: Good       Standing balance-Leahy Scale: Fair                               Pertinent Vitals/Pain Pain Assessment: No/denies pain    Home Living Family/patient expects to be discharged to:: Private residence Living Arrangements: Alone Available Help at Discharge: Family;Neighbor;Available PRN/intermittently Type of Home: House Home Access: Stairs to enter Entrance Stairs-Rails: None Entrance Stairs-Number of Steps: 1 Home Layout: One level Home Equipment: Walker - 2 wheels;Cane - single point      Prior Function Level of Independence: Independent with assistive device(s)         Comments: Intermittent use of RW or SPC; drives. Reports fall leading to admission was first fall in a while, was able to crawl to phone to call son     Hand Dominance        Extremity/Trunk Assessment   Upper Extremity Assessment Upper Extremity Assessment: Overall WFL for tasks assessed    Lower Extremity Assessment Lower Extremity Assessment: Overall WFL for tasks assessed       Communication   Communication: No difficulties  Cognition Arousal/Alertness: Awake/alert Behavior During Therapy: [facility] for tasks  assessed/performed Overall Cognitive Status: Within Functional Limits for tasks assessed                                 General Comments: Some confusion regarding details of fall and ability to call son. Other than this, appears [facility] for simple tasks; not formally assessed      General Comments General comments (skin integrity, edema, etc.): Discussed fall risk  reduction/safety strategies for home    Exercises     Assessment/Plan    PT Assessment Patient needs continued PT services  PT Problem List Decreased activity tolerance;Decreased balance;Decreased mobility       PT Treatment Interventions DME instruction;Gait training;Stair training;Functional mobility training;Therapeutic activities;Therapeutic exercise;Balance training;Patient/family education    PT Goals (Current goals can be found in the Care Plan section)  Acute Rehab PT Goals Patient Stated Goal: Return home PT Goal Formulation: With patient Time For Goal Achievement: 06/15/19 Potential to Achieve Goals: Good    Frequency Min 3X/week   Barriers to discharge        Co-evaluation               AM-PAC PT "6 Clicks" Mobility  Outcome Measure Help needed turning from your back to your side while in a flat bed without using bedrails?: None Help needed moving from lying on your back to sitting on the side of a flat bed without using bedrails?: None Help needed moving to and from a bed to a chair (including a wheelchair)?: None Help needed standing up from a chair using your arms (e.g., wheelchair or bedside chair)?: A Little Help needed to walk in hospital room?: A Little Help needed climbing 3-5 steps with a railing? : A Little 6 Click Score: 21    End of Session   Activity Tolerance: Patient tolerated treatment well Patient left: in chair;with call bell/phone within reach;with chair alarm set Nurse Communication: Mobility status PT Visit Diagnosis: Other abnormalities of gait and mobility (R26.89)    Time: 3825-0539 PT Time Calculation (min) (ACUTE ONLY): 21 min   Charges:   PT Evaluation $PT Eval Moderate Complexity: Jackson, PT, DPT Acute Rehabilitation Services  Pager [phone] Office [place] 06/01/2019, 10:22 AM

## 2019-06-01 NOTE — Progress Notes (Signed)
Update pts son with pts permission.

## 2019-06-01 NOTE — TOC Initial Note (Addendum)
Transition of Care [facility]) - Initial/Assessment Note    Patient Details  Name: Taylor Tyler MRN: [id number] Date of Birth: March 08, 1941  Transition of Care [facility]) CM/SW Contact:    Zenon Mayo, RN Phone Number: 06/01/2019, 10:03 PM  Clinical Narrative:                 From home alone, had a fall at home. Per pt eval no pt  F/u needed or DME.  Patient has been seen by [facility] in the past.  TOC team will follow for TOC needs.  PT eval rec HHPT, HHOT.  NCM offered choice, she chose [facility], NCM made referral to [facility] , awaiting call back. Patient states if [facility] can not take referral she does not have a preference.  NCM made referral for HHPT to [facility] , he was able to take referral. Soc will begin Monday.   Expected Discharge Plan: Home/Self Care Barriers to Discharge: No Barriers Identified   Patient Goals and CMS Choice     Choice offered to / list presented to : NA  Expected Discharge Plan and Services Expected Discharge Plan: Home/Self Care In-house Referral: NA Discharge Planning Services: CM Consult Post Acute Care Choice: NA Living arrangements for the past 2 months: Single Family Home                 DME Arranged: (NA)         HH Arranged: NA          Prior Living Arrangements/Services Living arrangements for the past 2 months: Single Family Home Lives with:: Self                   Activities of Daily Living Home Assistive Devices/Equipment: Environmental consultant (specify type)(2 front wheel walker) ADL Screening (condition at time of admission) Patient's cognitive ability adequate to safely complete daily activities?: Yes Is the patient deaf or have difficulty hearing?: No Does the patient have difficulty seeing, even when wearing glasses/contacts?: No Does the patient have difficulty concentrating, remembering, or making decisions?: No Patient able to express need for assistance with ADLs?: Yes Does the patient have difficulty dressing or bathing?: Yes Independently  performs ADLs?: No Does the patient have difficulty walking or climbing stairs?: Yes Weakness of Legs: Both Weakness of Arms/Hands: Both  Permission Sought/Granted                  Emotional Assessment       Orientation: : Oriented to Self, Oriented to Place, Fluctuating Orientation (Suspected and/or reported Sundowners) Alcohol / Substance Use: Not Applicable Psych Involvement: No (comment)  Admission diagnosis:  Hyperkalemia [E87.5] Elevated troponin [R77.8] Bilateral leg edema [R60.0] AKI (acute kidney injury) ([place]) [N17.9] Acute on chronic combined systolic and diastolic CHF (congestive heart failure) ([place]) [I50.43] Fall from bed, initial encounter [W06.XXXA] Symptomatic anemia [D64.9] Acute CHF (congestive heart failure) ([place]) [I50.9] Patient Active Problem List   Diagnosis Date Noted  . Acute CHF (congestive heart failure) ([place]) 05/30/2019  . Infection and inflammatory reaction due to internal fixation device of spine, initial encounter ([place]) 06/05/2018  . Neuropathy 01/08/2018  . Fatty liver 01/05/2018  . Chronic pain of both knees 01/05/2018  . Ventral hernia 01/05/2018  . CKD (chronic kidney disease) stage 4, GFR 15-29 ml/min (HCC) 07/21/2017  . Vitamin D deficiency 07/21/2017  . Essential hypertension   . Postoperative wound infection 04/02/2017  . Anemia 03/14/2017  . Acute blood loss anemia 03/14/2017  . Acute renal failure superimposed on  stage 3 chronic kidney disease ([place]) 03/14/2017  . Obesity, Class III, BMI 40-49.9 (morbid obesity) ([place]) 02/27/2017  . DM (diabetes mellitus), type 2 with renal complications ([employer]) 51/83/4373  . Status post lumbar surgery 02/24/2017  . CKD (chronic kidney disease) stage 3, GFR 30-59 ml/min 01/15/2017  . Hypothyroidism 01/15/2017  . Diverticulosis 01/15/2017  . HLD (hyperlipidemia) 01/15/2017  . Spinal stenosis of lumbar region 01/15/2017  . Disease of pancreas 07/21/2012   PCP:  McLean-Scocuzza, Nino Glow,  MD Pharmacy:   [id number] [place], [address] [place] [address] Phone: [phone] Fax: [phone]     Social Determinants of Health (SDOH) Interventions    Readmission Risk Interventions No flowsheet data found.

## 2019-06-02 ENCOUNTER — Inpatient Hospital Stay (HOSPITAL_COMMUNITY): Payer: Medicare Other

## 2019-06-02 DIAGNOSIS — I10 Essential (primary) hypertension: Secondary | ICD-10-CM

## 2019-06-02 LAB — CBC
HCT: 24.2 % — ABNORMAL LOW (ref 36.0–46.0)
Hemoglobin: 7.3 g/dL — ABNORMAL LOW (ref 12.0–15.0)
MCH: 25.8 pg — ABNORMAL LOW (ref 26.0–34.0)
MCHC: 30.2 g/dL (ref 30.0–36.0)
MCV: 85.5 fL (ref 80.0–100.0)
Platelets: 401 10*3/uL — ABNORMAL HIGH (ref 150–400)
RBC: 2.83 MIL/uL — ABNORMAL LOW (ref 3.87–5.11)
RDW: 16.5 % — ABNORMAL HIGH (ref 11.5–15.5)
WBC: 12.3 10*3/uL — ABNORMAL HIGH (ref 4.0–10.5)
nRBC: 0 % (ref 0.0–0.2)

## 2019-06-02 LAB — BASIC METABOLIC PANEL
Anion gap: 10 (ref 5–15)
BUN: 55 mg/dL — ABNORMAL HIGH (ref 8–23)
CO2: 16 mmol/L — ABNORMAL LOW (ref 22–32)
Calcium: 8 mg/dL — ABNORMAL LOW (ref 8.9–10.3)
Chloride: 107 mmol/L (ref 98–111)
Creatinine, Ser: 5.46 mg/dL — ABNORMAL HIGH (ref 0.44–1.00)
GFR calc Af Amer: 8 mL/min — ABNORMAL LOW (ref >60)
GFR calc non Af Amer: 7 mL/min — ABNORMAL LOW (ref >60)
Glucose, Bld: 110 mg/dL — ABNORMAL HIGH (ref 70–99)
Potassium: 4.3 mmol/L (ref 3.5–5.1)
Sodium: 133 mmol/L — ABNORMAL LOW (ref 135–145)

## 2019-06-02 LAB — GLUCOSE, CAPILLARY
Glucose-Capillary: 92 mg/dL (ref 70–99)
Glucose-Capillary: 180 mg/dL — ABNORMAL HIGH (ref 70–99)
Glucose-Capillary: 100 mg/dL — ABNORMAL HIGH (ref 70–99)
Glucose-Capillary: 94 mg/dL (ref 70–99)

## 2019-06-02 MED ORDER — INSULIN ASPART 100 UNIT/ML ~~LOC~~ SOLN
0.0000 [IU] | Freq: Three times a day (TID) | SUBCUTANEOUS | Status: DC
  Administered 2019-06-02: 2 [IU] via SUBCUTANEOUS

## 2019-06-02 MED ORDER — FUROSEMIDE 10 MG/ML IJ SOLN
80.0000 mg | Freq: Three times a day (TID) | INTRAMUSCULAR | Status: DC
  Administered 2019-06-02 – 2019-06-05 (×9): 80 mg via INTRAVENOUS
  Filled 2019-06-02 (×10): qty 8

## 2019-06-02 NOTE — Progress Notes (Signed)
PROGRESS NOTE    Taylor Tyler  XTK:[id number] DOB: [birth year] DOA: 05/30/2019 PCP: McLean-Scocuzza, Nino Glow, MD    Brief Narrative:  Taylor Tyler a 78 y.o.femalewithhistory of diabetes mellitus type 2 presently not on medication, hypertension, chronic kidney disease stage 4, anemia, hypothyroidism, hyperlipidemia, nonhealing wound of the low back after surgery was brought to the ER the patient had a fall at home. Patient states on Sunday evening she was trying to get out of the bed when she slid off the bed and fell onto the floor following which patient was not able to get up. She was lying on the floor for at least 8 hours following which patient son came and knocked on the door. Patient had to crawl and open the door. Patient denies hitting her head or losing consciousness. Over the last few weeks patient has been feeling increasingly short of breath with increasing lower extremity edema. She was admitted for acute on chronic diastolic heart failure.  New events last 24 hours / Subjective: Continues to have lower extremity edema bilaterally. She says she has generalized weakness.   Assessment & Plan:   Principal Problem:   Acute CHF (congestive heart failure) (HCC) Active Problems:   Hypothyroidism   DM (diabetes mellitus), type 2 with renal complications (HCC)   Anemia   Acute renal failure superimposed on stage 3 chronic kidney disease ([place])   Essential hypertension  Acute hypoxemic respiratory failure -D-dimer2.94. VQ scan low risk for PE.  - C/o shortness of breath. O2 sat stable on room air.   Acute on chronic diastolic heart failure -Previous echocardiogram with EF 55 to 99%, grade 2 diastolic dysfunction -BNP 851.4 -MEQ:[id number] with mild central vascular congestion. No overt edema or evidence of infection. -On IV Lasix and monitor BMP closely 06/02/2019: Continues to have worsening lower extremity edema.  Continues to have shortness of breath.  -Consulted nephrology.  Per nephrology recommendations we will increase the Lasix dose to 80 mg IV 3 times daily.  We will also order repeat chest x-ray. - Continue strict I's and O's, daily weight  Hypertensive urgency -Continue amlodipine, labetalol, hydralazine, Imdur,catapres, lasix  -Continue to monitor blood pressure and adjust medications as needed.  Acute kidney injury on CKD stage IV -Baseline creatinine 2.24, eGFR 20s  -FENa 7.64, ATN  -Renal ultrasoundno worrisome renal lesions or hydronephrosis 06/02/2019: Creatinine worsening.  Consulted nephrology. -Trend BMP while on IV lasix   Fall and weakness -CK 283 -PT OTeval, no PT follow up recommended   Diabetes mellitus type 2, well controlled -Hemoglobin A1c5.2 -Sliding scale insulin  Hypothyroidism -TSH elevated 5.569, free T4 normal 0.84. Recommend repeating as an outpatient -Continue Synthroid  Hyperlipidemia -Continue Lipitor    DVT prophylaxis: Subq hep Code Status: Full Family Communication: None at bedside Disposition Plan: Pending improvement in LE edema, Cr   Consultants:   Consulted nephrology  Procedures:   None   Subjective: She is complaining of shortness of breath, worsening lower extremity edema.  She is also complaining of generalized weakness.  Objective: Vitals:   06/01/19 2026 06/02/19 0510 06/02/19 0510 06/02/19 0531  BP: (!) 172/61 (!) 163/59 (!) 163/59   Pulse: 69 73 73   Resp: '19 20 20   ' Temp: 98.7 F (37.1 C) 99.3 F (37.4 C) 99.3 F (37.4 C)   TempSrc: Oral Oral Oral   SpO2: 96% 96% 96%   Weight:    99.7 kg  Height:        Intake/Output Summary (Last 24  hours) at 06/02/2019 0852 Last data filed at 06/02/2019 0845 Gross per 24 hour  Intake 540 ml  Output 700 ml  Net -160 ml   Filed Weights   05/31/19 1434 06/01/19 0458 06/02/19 0531  Weight: 98.8 kg 97.9 kg 99.7 kg    Examination:  General exam: obese female, calm and comfortable   Respiratory system: Respiratory effort normal.  Decreased breath sounds lower lobes, few crackles, no rhonchi or wheezing. Cardiovascular system: S1 & S2 heard, RRR. No murmur. Bilateral pedal edema. Gastrointestinal system: Obese female, soft and nontender. Normal bowel sounds heard. Central nervous system: Alert and oriented. Generalized weakness, no focal deficits Extremities: Symmetric 5 x 5 power. Skin: No rashes Psychiatry: Judgement and insight appear normal. Mood & affect appropriate.     Data Reviewed: I have personally reviewed following labs and imaging studies  CBC: Recent Labs  Lab 05/30/19 1046 05/31/19 0045 05/31/19 0408 06/01/19 0404  WBC 14.9* 14.7* 14.7* 12.8*  HGB 8.9* 8.7* 8.3* 7.7*  HCT 29.8* 28.2* 27.7* 25.6*  MCV 87.4 85.7 87.9 85.9  PLT 436* 462* 468* 962*   Basic Metabolic Panel: Recent Labs  Lab 05/30/19 1046 05/31/19 0045 05/31/19 0408 06/01/19 0404  NA 136  --  136 136  K 5.3*  --  4.6 4.3  CL 107  --  108 108  CO2 16*  --  16* 18*  GLUCOSE 103*  --  70 91  BUN 55*  --  55* 54*  CREATININE 5.74* 6.13* 5.79* 5.68*  CALCIUM 8.6*  --  8.7* 8.2*   GFR: Estimated Creatinine Clearance: 9.5 mL/min (A) (by C-G formula based on SCr of 5.68 mg/dL (H)). Liver Function Tests: Recent Labs  Lab 05/31/19 0635  AST 27  ALT 16  ALKPHOS 51  BILITOT 0.5  PROT 7.2  ALBUMIN 2.0*   No results for input(s): LIPASE, AMYLASE in the last 168 hours. No results for input(s): AMMONIA in the last 168 hours. Coagulation Profile: No results for input(s): INR, PROTIME in the last 168 hours. Cardiac Enzymes: Recent Labs  Lab 05/30/19 2056 05/31/19 0635  CKTOTAL 364* 283*   BNP (last 3 results) No results for input(s): PROBNP in the last 8760 hours. HbA1C: Recent Labs    05/31/19 0023  HGBA1C 5.2   CBG: Recent Labs  Lab 06/01/19 0506 06/01/19 1141 06/01/19 1615 06/01/19 2151 06/02/19 0643  GLUCAP 94 104* 132* 109* 92   Lipid Profile: No  results for input(s): CHOL, HDL, LDLCALC, TRIG, CHOLHDL, LDLDIRECT in the last 72 hours. Thyroid Function Tests: Recent Labs    05/31/19 0408 05/31/19 0804  TSH 5.569*  --   FREET4  --  0.84   Anemia Panel: No results for input(s): VITAMINB12, FOLATE, FERRITIN, TIBC, IRON, RETICCTPCT in the last 72 hours. Sepsis Labs: No results for input(s): PROCALCITON, LATICACIDVEN in the last 168 hours.  Recent Results (from the past 240 hour(s))  SARS CORONAVIRUS 2 (TAT 6-24 HRS) Nasopharyngeal Nasopharyngeal Swab     Status: None   Collection Time: 05/30/19 10:02 PM   Specimen: Nasopharyngeal Swab  Result Value Ref Range Status   SARS Coronavirus 2 NEGATIVE NEGATIVE Final    Comment: (NOTE) SARS-CoV-2 target nucleic acids are NOT DETECTED. The SARS-CoV-2 RNA is generally detectable in upper and lower respiratory specimens during the acute phase of infection. Negative results do not preclude SARS-CoV-2 infection, do not rule out co-infections with other pathogens, and should not be used as the sole basis for treatment or other patient  management decisions. Negative results must be combined with clinical observations, patient history, and epidemiological information. The expected result is Negative. Fact Sheet for Patients: SugarRoll.be Fact Sheet for Healthcare Providers: https://www.woods-mathews.com/ This test is not yet approved or cleared by the [place] FDA and  has been authorized for detection and/or diagnosis of SARS-CoV-2 by FDA under an Emergency Use Authorization (EUA). This EUA will remain  in effect (meaning this test can be used) for the duration of the COVID-19 declaration under Section 56 4(b)(1) of the Act, 21 U.S.C. section 360bbb-3(b)(1), unless the authorization is terminated or revoked sooner. Performed at [facility], [place] [address], [place], [place] [address]          Radiology Studies: US Renal   Result Date: 19-Jun-2019 CLINICAL DATA:  Renal insufficiency. EXAM: RENAL / URINARY TRACT ULTRASOUND COMPLETE COMPARISON:  CT scan 03/29/2017 and prior renal ultrasound 06/04/2018 FINDINGS: Right Kidney: Renal measurements: 10.9 x 5.7 x 5.1 cm = volume: 165.7 mL. Diffuse increased echogenicity and renal cortical thinning but no focal lesions or hydronephrosis. Left Kidney: Renal measurements: 12.5 x 6.0 x 5.3 cm = volume: 209.3 mL. Diffuse increased echogenicity and renal cortical thinning but no focal lesions or hydronephrosis. Bladder: Appears normal for degree of bladder distention. Other: None. IMPRESSION: 1. Stable echogenic and mildly atrophic kidneys consistent with medical renal disease. 2. No worrisome renal lesions or hydronephrosis. Electronically Signed   By: Marijo Sanes M.D.   On: 06/19/2019 11:04   Nm Pulmonary Perf And Vent  Result Date: 06/19/2019 CLINICAL DATA:  Inpatient.  Elevated D-dimer.  Dyspnea.  Fall. EXAM: NUCLEAR MEDICINE VENTILATION - PERFUSION LUNG SCAN TECHNIQUE: Ventilation images were obtained in multiple projections using inhaled aerosol Tc-15mDTPA. Perfusion images were obtained in multiple projections after intravenous injection of Tc-971mAA. RADIOPHARMACEUTICALS:  38.7 mCi of Tc-9958mPA aerosol inhalation and 1.5 mCi Tc99m29m IV COMPARISON:  Chest radiograph from one day prior. FINDINGS: Ventilation: No focal right lung ventilation defect. Small matched posterior upper left lung ventilation defect. Perfusion: No focal right lung perfusion defect. Small matched posterior upper left lung perfusion defect. IMPRESSION: Low probability for acute pulmonary embolism (10-19%). Electronically Signed   By: JasoIlona Sorrel.   On: 10/2Nov 15, 202037        Scheduled Meds: . amLODipine  10 mg Oral Daily  . atorvastatin  40 mg Oral q1800  . cloNIDine  0.1 mg Oral BID  . ferrous sulfate  325 mg Oral Q breakfast  . furosemide  40 mg Intravenous Daily  . gabapentin  300 mg  Oral QHS  . heparin  5,000 Units Subcutaneous Q8H  . hydrALAZINE  50 mg Oral Q8H  . insulin aspart  0-9 Units Subcutaneous TID WC  . labetalol  100 mg Oral BID  . levothyroxine  100 mcg Oral QAC breakfast   Continuous Infusions:   LOS: 3 days    AnupYaakov Guthrie [facility] Pager on amion  If 7PM-7AM, please contact night-coverage [email or web address] Password [email or web address] 06/02/2019, 8:52 AM

## 2019-06-02 NOTE — TOC Transition Note (Signed)
Transition of Care [facility]) - CM/SW Discharge Note   Patient Details  Name: [name] MRN: [id number] Date of Birth: 11-23-1940  [facility] of Care [facility]) CM/SW Contact:  Zenon Mayo, RN Phone Number: 06/02/2019, 5:05 PM   Clinical Narrative:    Patient is set up with [facility] for HHPT, soc will be for Monday.     Final next level of care: [place] Barriers to Discharge: No Barriers Identified   Patient Goals and CMS Choice Patient states their goals for this hospitalization and ongoing recovery are:: lose weight CMS Medicare.gov Compare Post Acute Care list provided to:: Patient Choice offered to / list presented to : Patient  Discharge Placement                       Discharge Plan and Services In-house Referral: NA Discharge Planning Services: CM Consult Post Acute Care Choice: NA          DME Arranged: (NA)         HH Arranged: PT HH Agency: [place] Date [place]: 06/02/19 Time [place]: 1705 Representative spoke with at [place]: [place] Determinants of Health ([place]) Interventions     Readmission Risk Interventions No flowsheet data found.

## 2019-06-02 NOTE — Consult Note (Signed)
[place] Nurse wound follow up Wound type:nonhealing surgical wound. Staff report oozing drainage. Will add absorbent layer to topical therapy  Measurement:see previous note Wound WIO:MBTDHR to visualize Drainage (amount, consistency, odor) increased oozing Periwound:scarring from back surgery Dressing procedure/placement/frequency:Add Aquacel aG for antimicrobial absorption to silicone foam.  Will not follow at this time.  Please re-consult if needed.  Domenic Moras MSN, RN, FNP-BC CWON Wound, Ostomy, Continence Nurse Pager [phone]

## 2019-06-02 NOTE — Progress Notes (Signed)
Patient's wound on her back is oozing some serous fluid. Gauze and foam are put in and MD notified.

## 2019-06-02 NOTE — Consult Note (Signed)
[facility] Renal Consultation Note  Requesting MD: TRH Indication for Consultation: Acute on chronic kidney injury   HPI:  Taylor Tyler is a 78 y.o. female with history of Type II DM, HFpEF, HTN, CKD stage IV, anemia, hypothyroidism, HLD admitted on 10/26 after a fall at home. She was down for at least 8 hours before son came home to find her. Prior to this, she had been complaining of increased shortness of breath and lower extremity edema.  In the ED, work-up was negative for acute cardiac ischemia, initial BMP with Na 135, K 5.3, CO2 16, BUN 55, Crt, 5.74. BNP 851, CK 364. CBC showed white count of 14.9, Hgb 8.9. CXR showed mild vascular congestion without evidence of infiltrate. She was subsequently admitted for acute HFpEF exacerbation.   Since admission, she has diuresed approximately 900 ml total. Weight has trended up 2 kg. Creatinine has started to trend down, but not a significant amount.   On evaluation this afternoon, she still endorses dyspnea on exertion. Denies fevers, chills, chest pain, abdominal pain, n/v, changes in stools, dysuria.   Creatinine  Date/Time Value Ref Range Status  04/10/2017 1.4 (A) 0.5 - 1.1 Final  03/09/2017 1.9 (A) 0.5 - 1.1 Final  04/22/2014 02:10 AM 1.09 0.60 - 1.30 mg/dL Final  04/21/2014 03:17 PM 1.13 0.60 - 1.30 mg/dL Final  01/29/2014 04:26 AM 0.75 0.60 - 1.30 mg/dL Final  01/28/2014 04:36 AM 0.71 0.60 - 1.30 mg/dL Final  01/27/2014 05:03 AM 1.11 0.60 - 1.30 mg/dL Final  01/25/2014 05:44 AM 0.74 0.60 - 1.30 mg/dL Final  01/23/2014 04:22 AM 0.82 0.60 - 1.30 mg/dL Final  01/20/2014 05:07 AM 1.40 (H) 0.60 - 1.30 mg/dL Final  01/19/2014 05:28 PM 1.65 (H) 0.60 - 1.30 mg/dL Final  12/18/2013 04:38 AM 0.97 0.60 - 1.30 mg/dL Final  12/15/2013 05:32 AM 1.06 0.60 - 1.30 mg/dL Final  12/14/2013 05:27 PM 0.96 0.60 - 1.30 mg/dL Final  07/12/2013 10:10 AM 1.01 0.60 - 1.30 mg/dL Final   Creat  Date/Time Value Ref Range Status  01/12/2019  01:52 PM 2.64 (H) 0.60 - 0.93 mg/dL Final    Comment:    For patients >40 years of age, the reference limit for Creatinine is approximately 13% higher for people identified as African-American. Marland Kitchen   12/08/2018 11:35 AM 2.24 (H) 0.60 - 0.93 mg/dL Final    Comment:    For patients >56 years of age, the reference limit for Creatinine is approximately 13% higher for people identified as African-American. .    Creatinine, Ser  Date/Time Value Ref Range Status  06/02/2019 08:25 AM 5.46 (H) 0.44 - 1.00 mg/dL Final  06/01/2019 04:04 AM 5.68 (H) 0.44 - 1.00 mg/dL Final  05/31/2019 04:08 AM 5.79 (H) 0.44 - 1.00 mg/dL Final  05/31/2019 12:45 AM 6.13 (H) 0.44 - 1.00 mg/dL Final  05/30/2019 10:46 AM 5.74 (H) 0.44 - 1.00 mg/dL Final  06/22/2018 09:11 AM 1.66 (H) 0.44 - 1.00 mg/dL Final  06/11/2018 03:57 AM 2.04 (H) 0.44 - 1.00 mg/dL Final  06/09/2018 05:06 AM 1.99 (H) 0.44 - 1.00 mg/dL Final  06/08/2018 04:57 AM 2.21 (H) 0.44 - 1.00 mg/dL Final  06/07/2018 04:32 AM 1.94 (H) 0.44 - 1.00 mg/dL Final  06/06/2018 03:14 AM 2.10 (H) 0.44 - 1.00 mg/dL Final  06/05/2018 02:55 PM 2.19 (H) 0.44 - 1.00 mg/dL Final  06/05/2018 05:22 AM 2.23 (H) 0.44 - 1.00 mg/dL Final  06/04/2018 01:15 PM 2.36 (H) 0.44 - 1.00 mg/dL Final  06/04/2018 02:45 AM 2.45 (H) 0.44 - 1.00 mg/dL Final  04/19/2018 10:35 AM 1.69 (H) 0.40 - 1.20 mg/dL Final  01/05/2018 11:09 AM 1.50 (H) 0.40 - 1.20 mg/dL Final  08/06/2017 10:16 AM 1.33 (H) 0.40 - 1.20 mg/dL Final  07/21/2017 10:01 AM 1.46 (H) 0.40 - 1.20 mg/dL Final  04/06/2017 11:09 AM 1.97 (H) 0.44 - 1.00 mg/dL Final  04/04/2017 05:24 AM 1.83 (H) 0.44 - 1.00 mg/dL Final  04/03/2017 03:18 AM 2.29 (H) 0.44 - 1.00 mg/dL Final  04/02/2017 04:05 AM 2.47 (H) 0.44 - 1.00 mg/dL Final  04/01/2017 03:33 AM 2.67 (H) 0.44 - 1.00 mg/dL Final  03/30/2017 06:18 PM 2.31 (H) 0.44 - 1.00 mg/dL Final  03/29/2017 01:23 PM 2.87 (H) 0.44 - 1.00 mg/dL Final  03/03/2017 08:46 AM 1.53 (H) 0.44 - 1.00  mg/dL Final  03/02/2017 06:48 AM 1.65 (H) 0.44 - 1.00 mg/dL Final  03/01/2017 09:58 AM 1.91 (H) 0.44 - 1.00 mg/dL Final  02/27/2017 10:45 AM 2.30 (H) 0.44 - 1.00 mg/dL Final  02/25/2017 08:02 AM 1.81 (H) 0.44 - 1.00 mg/dL Final  02/24/2017 03:43 PM 1.42 (H) 0.44 - 1.00 mg/dL Final  02/16/2017 09:26 AM 1.65 (H) 0.44 - 1.00 mg/dL Final  01/16/2017 04:04 AM 1.63 (H) 0.44 - 1.00 mg/dL Final  01/01/2017 09:18 AM 1.37 (H) 0.44 - 1.00 mg/dL Final  10/20/2016 10:28 AM 2.18 (H) 0.44 - 1.00 mg/dL Final  07/06/2016 12:08 PM 1.23 (H) 0.44 - 1.00 mg/dL Final  04/08/2016 08:47 AM 1.08 (H) 0.44 - 1.00 mg/dL Final  02/22/2013 03:17 PM 1.14 (H) 0.50 - 1.10 mg/dL Final     PMHx:   Past Medical History:  Diagnosis Date  . Acute blood loss anemia 03/14/2017  . Acute renal failure superimposed on stage 3 chronic kidney disease ([place]) 03/14/2017  . AKI (acute kidney injury) ([place]) 02/28/2017  . Altered mental status 03/29/2017  . Anemia   . Aortic atherosclerosis ([place])   . Arthritis    "joints might ache at times; not that bad" (06/10/2018)  . Bilateral lower extremity edema 03/04/2017  . Bradycardia   . Chronic kidney disease    ?? renal insufficiency,   . CKD (chronic kidney disease) stage 3, GFR 30-59 ml/min 01/15/2017   ?? renal insufficiency, which she thinks is coming from "all these medications"  . Diabetes mellitus without complication ([place])    diagnosed 4-5 yrs ago, 06/21/18- "that was years ago"  . Disease of pancreas   . Diverticulitis    s/p perforation and partial colectomy 01/27/14 with 3 benign lymph nodes   . Diverticulosis 01/15/2017  . DJD (degenerative joint disease)   . DM (diabetes mellitus), type 2 with renal complications ([place]) 1/61/0960  . Fatty liver   . Hypertension   . Hypothyroidism    "had radiation" (06/10/2018)  . Kidney stone   . Lethargy 02/28/2017  . Obesity, Class III, BMI 40-49.9 (morbid obesity) ([place]) 02/27/2017  . Pleural lipoma   . Postoperative wound infection  04/02/2017  . Spinal stenosis of lumbar region 01/15/2017  . Status post lumbar surgery   . Wound healing, delayed    back    Past Surgical History:  Procedure Laterality Date  . ABDOMINAL EXPOSURE N/A 02/24/2017   Procedure: ABDOMINAL EXPOSURE;  Surgeon: Angelia Mould, MD;  Location: [place];  Service: Vascular;  Laterality: N/A;  . ABDOMINAL HYSTERECTOMY  1987   no h/o abnormal paps   . ANTERIOR LAT LUMBAR FUSION N/A 02/24/2017   Procedure: Lumbar three-  five Anterior lateral lumbar interbody fusion;  Surgeon: Ditty, Kevan Ny, MD;  Location: [place];  Service: Neurosurgery;  Laterality: N/A;  L3-5 Anterior lateral lumbar interbody fusion with removal of coflex at L3-4, L4-5  . ANTERIOR LUMBAR FUSION N/A 02/24/2017   Procedure: Stage 1: Lumbar five-Sacral one Anterior lumbar interbody fusion;  Surgeon: Ditty, Kevan Ny, MD;  Location: [place];  Service: Neurosurgery;  Laterality: N/A;  Stage 1: L5-S1 Anterior lumbar interbody fusion  . APPENDECTOMY    . APPLICATION OF ROBOTIC ASSISTANCE FOR SPINAL PROCEDURE N/A 02/26/2017   Procedure: APPLICATION OF ROBOTIC ASSISTANCE FOR SPINAL PROCEDURE;  Surgeon: Ditty, Kevan Ny, MD;  Location: [place];  Service: Neurosurgery;  Laterality: N/A;  . APPLICATION OF WOUND VAC N/A 03/30/2017   Procedure: APPLICATION OF WOUND VAC;  Surgeon: Ditty, Kevan Ny, MD;  Location: [place];  Service: Neurosurgery;  Laterality: N/A;  . BACK SURGERY  2013  . CATARACT EXTRACTION W/ INTRAOCULAR LENS IMPLANT Right   . CHOLECYSTECTOMY OPEN  1982  . COLON SURGERY  01/26/2014   desc.sigmoid colectomy and ventral hernia repair and splenic flexure mobilization  . COLON SURGERY    . DILATION AND CURETTAGE OF UTERUS    . HERNIA REPAIR    . LUMBAR LAMINECTOMY WITH SPINOUS PROCESS PLATE 2 LEVEL N/A 3/50/0938   Procedure: LUMBAR LAMINECTOMY/DECOMPRESSION MICRODISCECTOMY CoFlex;  Surgeon: Faythe Ghee, MD;  Location: [facility];  Service: Neurosurgery;   Laterality: N/A;  Lumbar three-four,Lumbar Four-Five Laminectomy with Coflex  . LUMBAR WOUND DEBRIDEMENT N/A 03/30/2017   Procedure: Lumbar wound exploration/debridement, placement of wound vac;  Surgeon: Ditty, Kevan Ny, MD;  Location: [place];  Service: Neurosurgery;  Laterality: N/A;  Lumbar wound exploration/debridement, placement of wound vac  . LUMBAR WOUND DEBRIDEMENT N/A 06/06/2018   Procedure: LUMBAR WOUND DEBRIDEMENT/EXPLORATION;  Surgeon: Consuella Lose, MD;  Location: [place];  Service: Neurosurgery;  Laterality: N/A;  . LUMBAR WOUND DEBRIDEMENT N/A 06/22/2018   Procedure: SIMPLE INCISION AND DRAINAGE OF WOUND, APPLICATION OF WOUND VAC;  Surgeon: Consuella Lose, MD;  Location: [place];  Service: Neurosurgery;  Laterality: N/A;  . PARTIAL COLECTOMY     01/27/14 diverticulitis and 3 benign lymph nodes [facility] Dr. Pat Patrick   . REDUCTION MAMMAPLASTY Bilateral 1992  . TUBAL LIGATION    . VENTRAL HERNIA REPAIR  2014    Family Hx:  Family History  Problem Relation Age of Onset  . Diabetes Father   . Heart attack Father   . Hypertension Father   . Heart disease Father   . Kidney disease Father   . Diabetes Mother   . Hypertension Mother   . Heart disease Mother   . Kidney disease Mother   . Lupus Sister   . Heart disease Brother   . Diabetes Brother   . Heart disease Brother   . Diabetes Brother   . Lupus Sister     Social History:  reports that she has never smoked. She has never used smokeless tobacco. She reports previous alcohol use. She reports that she does not use drugs.  Allergies:  Allergies  Allergen Reactions  . Clams [Shellfish Allergy] Swelling and Other (See Comments)    THROAT SWELLS NECK TURNS RED  . Hydralazine Other (See Comments)    CHEST TIGHTNESS Patient has tolerated multiple doses of hydralazine since allergy was listed   . Norvasc [Amlodipine Besylate] Swelling    Leg edema   . Milk-Related Compounds Diarrhea    Medications: Prior to  Admission medications  Medication Sig Start Date End Date Taking? Authorizing Provider  acetaminophen (TYLENOL) 500 MG tablet Take 500 mg by mouth 2 (two) times daily as needed for mild pain.    Yes [provider]  amLODipine (NORVASC) 10 MG tablet Take 1 tablet (10 mg total) by mouth daily. 06/12/18  Yes Hosie Poisson, MD  cloNIDine (CATAPRES) 0.1 MG tablet Take 0.1 mg by mouth 2 (two) times daily.   Yes [provider]  ferrous sulfate 325 (65 FE) MG tablet Take 325 mg by mouth daily with breakfast.   Yes [provider]  furosemide (LASIX) 40 MG tablet Take 1 tablet (40 mg total) by mouth 2 (two) times daily. In am and with lunch as needed leg swelling. Further refills Dr. Johnney Ou (kidney doctor) 03/07/19  Yes McLean-Scocuzza, Nino Glow, MD  gabapentin (NEURONTIN) 300 MG capsule Take 1 capsule (300 mg total) by mouth at bedtime. 04/29/19  Yes McLean-Scocuzza, Nino Glow, MD  hydrALAZINE (APRESOLINE) 50 MG tablet Take 1 tablet (50 mg total) by mouth every 8 (eight) hours. 06/11/18  Yes Hosie Poisson, MD  levothyroxine (SYNTHROID, LEVOTHROID) 100 MCG tablet Take 100 mcg by mouth daily before breakfast.   Yes [provider]  atorvastatin (LIPITOR) 40 MG tablet Take 1 tablet (40 mg total) by mouth daily at 6 PM. Patient not taking: Reported on 05/31/2019 09/03/18   McLean-Scocuzza, Nino Glow, MD  labetalol (NORMODYNE) 100 MG tablet Take 100 mg by mouth 2 (two) times daily.    [provider]    I have reviewed the patient's current medications.  Labs:  Results for orders placed or performed during the hospital encounter of 05/30/19 (from the past 48 hour(s))  Glucose, capillary     Status: None   Collection Time: 05/31/19  6:06 PM  Result Value Ref Range   Glucose-Capillary 93 70 - 99 mg/dL  Glucose, capillary     Status: Abnormal   Collection Time: 05/31/19  9:23 PM  Result Value Ref Range   Glucose-Capillary 101 (H) 70 - 99 mg/dL  CBC     Status: Abnormal    Collection Time: 06/01/19  4:04 AM  Result Value Ref Range   WBC 12.8 (H) 4.0 - 10.5 K/uL   RBC 2.98 (L) 3.87 - 5.11 MIL/uL   Hemoglobin 7.7 (L) 12.0 - 15.0 g/dL   HCT 25.6 (L) 36.0 - 46.0 %   MCV 85.9 80.0 - 100.0 fL   MCH 25.8 (L) 26.0 - 34.0 pg   MCHC 30.1 30.0 - 36.0 g/dL   RDW 16.5 (H) 11.5 - 15.5 %   Platelets 435 (H) 150 - 400 K/uL   nRBC 0.0 0.0 - 0.2 %    Comment: Performed at [facility], [address] [place], [place] [address]  Basic metabolic panel     Status: Abnormal   Collection Time: 06/01/19  4:04 AM  Result Value Ref Range   Sodium 136 135 - 145 mmol/L   Potassium 4.3 3.5 - 5.1 mmol/L   Chloride 108 98 - 111 mmol/L   CO2 18 (L) 22 - 32 mmol/L   Glucose, Bld 91 70 - 99 mg/dL   BUN 54 (H) 8 - 23 mg/dL   Creatinine, Ser 5.68 (H) 0.44 - 1.00 mg/dL   Calcium 8.2 (L) 8.9 - 10.3 mg/dL   GFR calc non Af Amer 7 (L) >60 mL/min   GFR calc Af Amer 8 (L) >60 mL/min   Anion gap 10 5 - 15  Comment: Performed at [facility], [place] [address] [place], [place] [address]  Glucose, capillary     Status: None   Collection Time: 06/01/19  5:06 AM  Result Value Ref Range   Glucose-Capillary 94 70 - 99 mg/dL  Glucose, capillary     Status: Abnormal   Collection Time: 06/01/19 11:41 AM  Result Value Ref Range   Glucose-Capillary 104 (H) 70 - 99 mg/dL   Comment 1 Notify RN    Comment 2 Document in Chart   Glucose, capillary     Status: Abnormal   Collection Time: 06/01/19  4:15 PM  Result Value Ref Range   Glucose-Capillary 132 (H) 70 - 99 mg/dL   Comment 1 Notify RN    Comment 2 Document in Chart   Glucose, capillary     Status: Abnormal   Collection Time: 06/01/19  9:51 PM  Result Value Ref Range   Glucose-Capillary 109 (H) 70 - 99 mg/dL  Glucose, capillary     Status: None   Collection Time: 06/02/19  6:43 AM  Result Value Ref Range   Glucose-Capillary 92 70 - 99 mg/dL  Basic metabolic panel     Status: Abnormal   Collection Time: 06/02/19  8:25 AM   Result Value Ref Range   Sodium 133 (L) 135 - 145 mmol/L   Potassium 4.3 3.5 - 5.1 mmol/L   Chloride 107 98 - 111 mmol/L   CO2 16 (L) 22 - 32 mmol/L   Glucose, Bld 110 (H) 70 - 99 mg/dL   BUN 55 (H) 8 - 23 mg/dL   Creatinine, Ser 5.46 (H) 0.44 - 1.00 mg/dL   Calcium 8.0 (L) 8.9 - 10.3 mg/dL   GFR calc non Af Amer 7 (L) >60 mL/min   GFR calc Af Amer 8 (L) >60 mL/min   Anion gap 10 5 - 15    Comment: Performed at [facility], [place] [address] [place], [place] [address]  CBC     Status: Abnormal   Collection Time: 06/02/19  8:25 AM  Result Value Ref Range   WBC 12.3 (H) 4.0 - 10.5 K/uL   RBC 2.83 (L) 3.87 - 5.11 MIL/uL   Hemoglobin 7.3 (L) 12.0 - 15.0 g/dL   HCT 24.2 (L) 36.0 - 46.0 %   MCV 85.5 80.0 - 100.0 fL   MCH 25.8 (L) 26.0 - 34.0 pg   MCHC 30.2 30.0 - 36.0 g/dL   RDW 16.5 (H) 11.5 - 15.5 %   Platelets 401 (H) 150 - 400 K/uL   nRBC 0.0 0.0 - 0.2 %    Comment: Performed at [facility] [address], [place], [place] [address]  Glucose, capillary     Status: Abnormal   Collection Time: 06/02/19 12:09 PM  Result Value Ref Range   Glucose-Capillary 180 (H) 70 - 99 mg/dL     ROS:  Pertinent items noted in HPI and remainder of comprehensive ROS otherwise negative.  Physical Exam: Vitals:   06/02/19 1208 06/02/19 1221  BP: (!) 130/54 (!) 146/61  Pulse: 64   Resp: 19   Temp: 99.1 F (37.3 C)   SpO2: 98%      General: chronically ill appearing, lying in bed in NAD HEENT: moist mucous membranes  Eyes: conjunctiva normal  Heart: RRR  Lungs: normal work of breathing; lungs decreased at bases, with fine crackles  Abdomen: BS+; abdomen is obese, soft, non-tender, non-distended  Extremities: 2+ edema to knees bilaterally  Skin: warm and dry  Neuro: A&Ox3; answers questions and follows commands appropriately   Assessment/Plan: 1.Renal - acute on chronic stage IV kidney disease with baseline ~2.2. Renal u/s did not show evidence of obstruction. No  contrast or nephrotoxic agents to that would cause acute worsening. U/A on admission with proteinuria, but do not suspect an acute GN. FENa 7.6. Etiology most likely ATN in the setting of poor perfusion from volume overload secondary to HFpEF exacerbation. Will try more aggressive diuresis at Lasix 80 mg TID. Continue strict I&Os. No urgent indication for HD. Will check UPC to further evaluate proteinuria.  2. Hypertension/volume  - patient came in with hypertensive urgency. Has improved with resumption of home meds. Diuresis should also improve blood pressure.  3. Anemia  - hgb around baseline. She is on iron supplementation and ESA q 2 weeks as outpatient.    Nila Nephew D [place] 06/02/2019, 1:12 PM

## 2019-06-02 NOTE — Progress Notes (Signed)
Physical Therapy Treatment Patient Details Name: Taylor Tyler MRN: [id number] DOB: 07/12/1941 Today's Date: 06/02/2019    History of Present Illness Pt is a 78 y.o. female admitted 05/30/19 after fall at home, laying on the floor at least 8 hours until found by son, also with worsening SOB and LE edema. VQ scan with low probability for PE. Worked up for HF. Other PMH includes CKD III, DM2, DJD s/p lumbar sx (2018), arthritis.   PT Comments    Pt with increased SOB and fatigue this session. Pt able to ambulate with RW and perform ADL tasks in room with min guard for balance; declining further activity or sitting in chair due to fatigue. Pt limited by decreased activity tolerance and generalized weakness. SpO2 96% on RA, HR 75. D/c recs updated to HHPT services; pt in agreement.    Follow Up Recommendations  Home health PT;Supervision for mobility/OOB     Equipment Recommendations  None recommended by PT    Recommendations for Other Services       Precautions / Restrictions Precautions Precautions: Fall Restrictions Weight Bearing Restrictions: No    Mobility  Bed Mobility Overal bed mobility: Needs Assistance Bed Mobility: Supine to Sit;Sit to Supine     Supine to sit: Modified independent (Device/Increase time);HOB elevated Sit to supine: Min assist   General bed mobility comments: Increased time and effort to sit EOB with DOE 3/4; minA to assist LE into supine, pt with difficulty lifting legs into bed due to swelling/fatigue  Transfers Overall transfer level: Needs assistance Equipment used: Rolling walker (2 wheeled) Transfers: Sit to/from Stand Sit to Stand: Min guard         General transfer comment: Pt with increased fatigue this session; min guard for standing to RW, reliant on BUE support of grab bars to stand from low toilet height  Ambulation/Gait Ambulation/Gait assistance: Min guard Gait Distance (Feet): 38 Feet Assistive device: Rolling walker (2  wheeled) Gait Pattern/deviations: Step-through pattern;Decreased stride length Gait velocity: Decreased   General Gait Details: Slow, fatigued gait with RW and min guard for balance; DOE 3/4 with standing rest break to lean on sink; SpO2 96% on RA, HR 75   Stairs             Wheelchair Mobility    Modified Rankin (Stroke Patients Only)       Balance Overall balance assessment: Needs assistance   Sitting balance-Leahy Scale: Fair       Standing balance-Leahy Scale: Fair Standing balance comment: Can static stand to pull up underwear without UE support                            Cognition Arousal/Alertness: Awake/alert Behavior During Therapy: WFL for tasks assessed/performed Overall Cognitive Status: Within Functional Limits for tasks assessed(not formally assessed)                                        Exercises      General Comments        Pertinent Vitals/Pain Pain Assessment: No/denies pain    Home Living                      Prior Function            PT Goals (current goals can now be found in the care plan section)  Progress towards PT goals: Not progressing toward goals - comment(Limited by fatigue/SOB)    Frequency    Min 3X/week      PT Plan Discharge plan needs to be updated    Co-evaluation              AM-PAC PT "6 Clicks" Mobility   Outcome Measure  Help needed turning from your back to your side while in a flat bed without using bedrails?: None Help needed moving from lying on your back to sitting on the side of a flat bed without using bedrails?: A Little Help needed moving to and from a bed to a chair (including a wheelchair)?: A Little Help needed standing up from a chair using your arms (e.g., wheelchair or bedside chair)?: A Little Help needed to walk in hospital room?: A Little Help needed climbing 3-5 steps with a railing? : A Little 6 Click Score: 19    End of Session    Activity Tolerance: Patient limited by fatigue Patient left: in bed;with call bell/phone within reach Nurse Communication: Mobility status PT Visit Diagnosis: Other abnormalities of gait and mobility (R26.89)     Time: 1643-5391 PT Time Calculation (min) (ACUTE ONLY): 15 min  Charges:  $Therapeutic Activity: 8-22 mins                    Mabeline Caras, PT, DPT Acute Rehabilitation Services  Pager [phone] Office [place] 06/02/2019, 2:49 PM

## 2019-06-03 LAB — CBC
HCT: 25.6 % — ABNORMAL LOW (ref 36.0–46.0)
Hemoglobin: 7.8 g/dL — ABNORMAL LOW (ref 12.0–15.0)
MCH: 26.3 pg (ref 26.0–34.0)
MCHC: 30.5 g/dL (ref 30.0–36.0)
MCV: 86.2 fL (ref 80.0–100.0)
Platelets: 396 10*3/uL (ref 150–400)
RBC: 2.97 MIL/uL — ABNORMAL LOW (ref 3.87–5.11)
RDW: 16.6 % — ABNORMAL HIGH (ref 11.5–15.5)
WBC: 11.1 10*3/uL — ABNORMAL HIGH (ref 4.0–10.5)
nRBC: 0 % (ref 0.0–0.2)

## 2019-06-03 LAB — BASIC METABOLIC PANEL
Anion gap: 13 (ref 5–15)
BUN: 61 mg/dL — ABNORMAL HIGH (ref 8–23)
CO2: 15 mmol/L — ABNORMAL LOW (ref 22–32)
Calcium: 8.1 mg/dL — ABNORMAL LOW (ref 8.9–10.3)
Chloride: 108 mmol/L (ref 98–111)
Creatinine, Ser: 5.59 mg/dL — ABNORMAL HIGH (ref 0.44–1.00)
GFR calc Af Amer: 8 mL/min — ABNORMAL LOW (ref >60)
GFR calc non Af Amer: 7 mL/min — ABNORMAL LOW (ref >60)
Glucose, Bld: 83 mg/dL (ref 70–99)
Potassium: 4.2 mmol/L (ref 3.5–5.1)
Sodium: 136 mmol/L (ref 135–145)

## 2019-06-03 LAB — MAGNESIUM: Magnesium: 1.8 mg/dL (ref 1.7–2.4)

## 2019-06-03 LAB — GLUCOSE, CAPILLARY
Glucose-Capillary: 116 mg/dL — ABNORMAL HIGH (ref 70–99)
Glucose-Capillary: 80 mg/dL (ref 70–99)
Glucose-Capillary: 125 mg/dL — ABNORMAL HIGH (ref 70–99)
Glucose-Capillary: 105 mg/dL — ABNORMAL HIGH (ref 70–99)
Glucose-Capillary: 85 mg/dL (ref 70–99)

## 2019-06-03 LAB — IRON AND TIBC
Iron: 23 ug/dL — ABNORMAL LOW (ref 28–170)
Saturation Ratios: 17 % (ref 10.4–31.8)
TIBC: 136 ug/dL — ABNORMAL LOW (ref 250–450)
UIBC: 113 ug/dL

## 2019-06-03 NOTE — Progress Notes (Addendum)
PROGRESS NOTE    Taylor Tyler  GBT:[id number] DOB: 14-Mar-1941 DOA: 05/30/2019 PCP: McLean-Scocuzza, Nino Glow, MD    Brief Narrative:  Taylor Tyler a 78 y.o.femalewithhistory of diabetes mellitus type 2 presently not on medication, hypertension, chronic kidney disease stage4, anemia, hypothyroidism, hyperlipidemia, nonhealing wound of the low back after surgery was brought to the ER the patient had a fall at home. Patient states on Sunday evening she was trying to get out of the bed when she slid off the bed and fell onto the floor following which patient was not able to get up. She was lying on the floor for at least 8 hours following which patient son came and knocked on the door. Patient had to crawl and open the door. Patient denies hitting her head or losing consciousness. Over the last few weeks patient has been feeling increasingly short of breathwith increasing lower extremity edema.She wasadmitted for acute on chronic diastolic heart failure.  New events last 24 hours / Subjective: Still has lower extremity edema bilaterally. C/o generalized weakness but says she feels somewhat better today.   Assessment & Plan:   Principal Problem:   Acute CHF (congestive heart failure) (HCC) Active Problems:   Hypothyroidism   DM (diabetes mellitus), type 2 with renal complications (HCC)   Anemia   Acute renal failure superimposed on stage 3 chronic kidney disease ([place])   Essential hypertension   Acute hypoxemic respiratory failure -D-dimer2.94. VQ scan low risk for PE.  - C/o shortness of breath. O2 sat stable on room air.  Acute on chronic diastolic heart failure -Previous echocardiogram with EF 55 to 71%, grade 2 diastolic dysfunction -BNP 851.4 -GGY:IRSWNIOEVOJJ with mild central vascular congestion. No overt edema or evidence of infection. -On IV Lasix and monitor BMP closely 06/02/2019: Continues to have worsening lower extremity edema.  Continues to have  shortness of breath. -Consulted nephrology.  Per nephrology recommendations we will increase the Lasix dose to 80 mg IV 3 times daily.  We will also order repeat chest x-ray. 06/03/2019: She says she feels somewhat better today. -Chest x-ray showing cardiomegaly and vascular congestion. - Continue diuresis per nephrology, strict I's and O's, daily weight  Hypertensive urgency -Continue amlodipine, labetalol, hydralazine, Imdur,catapres, lasix -Continue to monitor blood pressure and adjust medications as needed.  Acute kidney injury on CKD stageIV -Baseline creatinine 2.24, eGFR 20s -FENa 7.64, ATN  -Renal ultrasoundno worrisome renal lesions or hydronephrosis -Nephrology following.  On diuresis per nephrology.  Appreciate help. -Trend BMPwhile on IV lasix  Fall and weakness -CK 283 -PT OTeval, no PT follow up recommended  Diabetes mellitus type 2, well controlled -Hemoglobin A1c5.2 -Sliding scale insulin  Hypothyroidism -TSH elevated 5.569, free T4 normal 0.84. Recommend repeating as an outpatient -Continue Synthroid  Hyperlipidemia -Continue Lipitor  Non healing surgical wound of back - She has some drainage, no purulence or erythema. - Consulted wound care. - If any worsening, fever or worsening leukocytosis consider imaging and empiric Abx. Afebrile at this time. WBC today 11.1.    DVT prophylaxis:Subq hep Code Status:Full Family Communication:Attempted to reach family Disposition Plan:Pending improvement in LE edema, Cr   Consultants:  Nephrology  Procedures:  None  Objective: Vitals:   06/02/19 1947 06/03/19 0412 06/03/19 0655 06/03/19 0900  BP: (!) 170/63 (!) 145/62  (!) 152/51  Pulse: 69 (!) 56  62  Resp: 20 20    Temp: 99.5 F (37.5 C) 97.9 F (36.6 C)    TempSrc: Oral Oral  SpO2: 97% 99%    Weight:   98.9 kg   Height:        Intake/Output Summary (Last 24 hours) at 06/03/2019 0924 Last data filed at  06/03/2019 0853 Gross per 24 hour  Intake 924 ml  Output 850 ml  Net 74 ml   Filed Weights   06/01/19 0458 06/02/19 0531 06/03/19 0655  Weight: 97.9 kg 99.7 kg 98.9 kg    Examination:  General exam: Appears calm and comfortable, obese female Respiratory system: Decreased breath sound lower lobes, few crackles heard, no rhonchi or wheezing. Respiratory effort normal. Cardiovascular system: S1 & S2 heard, RRR. No murmur. Gastrointestinal system: Obese female, soft, nontender. Normal bowel sounds heard. Central nervous system: Alert and oriented. No focal neurological deficits. Extremities: Bilateral lower extremity edema, symmetric 5 x 5 power. Skin: No rashes Psychiatry: Judgement and insight appear normal. Mood & affect appropriate.     Data Reviewed: I have personally reviewed following labs and imaging studies  CBC: Recent Labs  Lab 05/31/19 0045 05/31/19 0408 06/01/19 0404 06/02/19 0825 06/03/19 0340  WBC 14.7* 14.7* 12.8* 12.3* 11.1*  HGB 8.7* 8.3* 7.7* 7.3* 7.8*  HCT 28.2* 27.7* 25.6* 24.2* 25.6*  MCV 85.7 87.9 85.9 85.5 86.2  PLT 462* 468* 435* 401* 502   Basic Metabolic Panel: Recent Labs  Lab 05/30/19 1046 05/31/19 0045 05/31/19 0408 06/01/19 0404 06/02/19 0825 06/03/19 0340  NA 136  --  136 136 133* 136  K 5.3*  --  4.6 4.3 4.3 4.2  CL 107  --  108 108 107 108  CO2 16*  --  16* 18* 16* 15*  GLUCOSE 103*  --  70 91 110* 83  BUN 55*  --  55* 54* 55* 61*  CREATININE 5.74* 6.13* 5.79* 5.68* 5.46* 5.59*  CALCIUM 8.6*  --  8.7* 8.2* 8.0* 8.1*  MG  --   --   --   --   --  1.8   GFR: Estimated Creatinine Clearance: 9.6 mL/min (A) (by C-G formula based on SCr of 5.59 mg/dL (H)). Liver Function Tests: Recent Labs  Lab 05/31/19 0635  AST 27  ALT 16  ALKPHOS 51  BILITOT 0.5  PROT 7.2  ALBUMIN 2.0*   No results for input(s): LIPASE, AMYLASE in the last 168 hours. No results for input(s): AMMONIA in the last 168 hours. Coagulation Profile: No  results for input(s): INR, PROTIME in the last 168 hours. Cardiac Enzymes: Recent Labs  Lab 05/30/19 2056 05/31/19 0635  CKTOTAL 364* 283*   BNP (last 3 results) No results for input(s): PROBNP in the last 8760 hours. HbA1C: No results for input(s): HGBA1C in the last 72 hours. CBG: Recent Labs  Lab 06/02/19 0643 06/02/19 1209 06/02/19 1607 06/02/19 2122 06/03/19 0701  GLUCAP 92 180* 100* 94 116*   Lipid Profile: No results for input(s): CHOL, HDL, LDLCALC, TRIG, CHOLHDL, LDLDIRECT in the last 72 hours. Thyroid Function Tests: No results for input(s): TSH, T4TOTAL, FREET4, T3FREE, THYROIDAB in the last 72 hours. Anemia Panel: No results for input(s): VITAMINB12, FOLATE, FERRITIN, TIBC, IRON, RETICCTPCT in the last 72 hours. Sepsis Labs: No results for input(s): PROCALCITON, LATICACIDVEN in the last 168 hours.  Recent Results (from the past 240 hour(s))  SARS CORONAVIRUS 2 (TAT 6-24 HRS) Nasopharyngeal Nasopharyngeal Swab     Status: None   Collection Time: 05/30/19 10:02 PM   Specimen: Nasopharyngeal Swab  Result Value Ref Range Status   SARS Coronavirus 2 NEGATIVE NEGATIVE Final  Comment: (NOTE) SARS-CoV-2 target nucleic acids are NOT DETECTED. The SARS-CoV-2 RNA is generally detectable in upper and lower respiratory specimens during the acute phase of infection. Negative results do not preclude SARS-CoV-2 infection, do not rule out co-infections with other pathogens, and should not be used as the sole basis for treatment or other patient management decisions. Negative results must be combined with clinical observations, patient history, and epidemiological information. The expected result is Negative. Fact Sheet for Patients: SugarRoll.be Fact Sheet for Healthcare Providers: https://www.woods-mathews.com/ This test is not yet approved or cleared by the [place] FDA and  has been authorized for detection and/or  diagnosis of SARS-CoV-2 by FDA under an Emergency Use Authorization (EUA). This EUA will remain  in effect (meaning this test can be used) for the duration of the COVID-19 declaration under Section 56 4(b)(1) of the Act, 21 U.S.C. section 360bbb-3(b)(1), unless the authorization is terminated or revoked sooner. Performed at [facility], [place] [address] [place], [place] [address]          Radiology Studies: Dg Chest Port 1 View  Result Date: 06/02/2019 CLINICAL DATA:  Shortness of breath.  CHF EXAM: PORTABLE CHEST 1 VIEW COMPARISON:  Three days ago FINDINGS: Cardiomegaly and vascular congestion. There is no edema, consolidation, effusion, or pneumothorax. IMPRESSION: Cardiomegaly and vascular congestion.  No edema or focal pneumonia. Electronically Signed   By: Monte Fantasia M.D.   On: 06/02/2019 10:35        Scheduled Meds: . amLODipine  10 mg Oral Daily  . atorvastatin  40 mg Oral q1800  . cloNIDine  0.1 mg Oral BID  . ferrous sulfate  325 mg Oral Q breakfast  . furosemide  80 mg Intravenous Q8H  . gabapentin  300 mg Oral QHS  . heparin  5,000 Units Subcutaneous Q8H  . hydrALAZINE  50 mg Oral Q8H  . insulin aspart  0-9 Units Subcutaneous TID WC  . labetalol  100 mg Oral BID  . levothyroxine  100 mcg Oral QAC breakfast   Continuous Infusions:   LOS: 4 days    Yaakov Guthrie, MD [facility] Pager on amion  If 7PM-7AM, please contact night-coverage [email or web address] Password [email or web address] 06/03/2019, 9:24 AM

## 2019-06-03 NOTE — Progress Notes (Signed)
Occupational Therapy Treatment Patient Details Name: Taylor Tyler MRN: [id number] DOB: May 25, 1941 Today's Date: 06/03/2019    History of present illness Pt is a 78 y.o. female admitted 05/30/19 after fall at home, laying on the floor at least 8 hours until found by son, also with worsening SOB and LE edema. VQ scan with low probability for PE. Worked up for HF. Other PMH includes CKD III, DM2, DJD s/p lumbar sx (2018), arthritis.   OT comments  Pt. Able to complete bed mobility, toileting tasks, and grooming tasks mostly S level.  Intermittent cues for sequencing and safety.  Notable cognitive impairments remain.    Follow Up Recommendations  Home health OT;Supervision/Assistance - 24 hour    Equipment Recommendations  None recommended by OT    Recommendations for Other Services      Precautions / Restrictions Precautions Precautions: Fall Restrictions Weight Bearing Restrictions: No       Mobility Bed Mobility Overal bed mobility: Needs Assistance Bed Mobility: Supine to Sit;Sit to Supine     Supine to sit: Modified independent (Device/Increase time);HOB elevated     General bed mobility comments: heavy reliance on hob being elevated  Transfers Overall transfer level: Needs assistance Equipment used: Rolling walker (2 wheeled) Transfers: Sit to/from Stand Sit to Stand: Min guard         General transfer comment: Pt with increased fatigue this session; min guard for standing to RW, reliant on BUE support of grab bars to stand from low toilet height    Balance                                           ADL either performed or assessed with clinical judgement   ADL Overall ADL's : Needs assistance/impaired     Grooming: Supervision/safety;Standing;Wash/dry hands                   Toilet Transfer: Supervision/safety;Ambulation;RW Toilet Transfer Details (indicate cue type and reason): instructed pt. to notify therapist asst. when she  was finished using the b.room, she verbalized understanding but then stood, performed care, and flushed without notification-when asked she said "oh my brain just told me to get up and do it myself" Toileting- Clothing Manipulation and Hygiene: Supervision/safety;Sit to/from stand       Functional mobility during ADLs: Supervision/safety;Min guard;Rolling walker General ADL Comments: pt limited by impaired balance, decreased activity tolerance and impaired cognition -verbalized understanding to call for asssitance but did not.  was also unable to identify the birthstones and who they were for on the ring on her left hand. thought it may be for her, her husband and 2 children but shook her head and said oh well i just cant remember.     Vision       Perception     Praxis      Cognition Arousal/Alertness: Awake/alert Behavior During Therapy: WFL for tasks assessed/performed Overall Cognitive Status: No family/caregiver present to determine baseline cognitive functioning Area of Impairment: Memory;Attention;Problem solving                               General Comments: vebalized understanding of instructions but did not follow during toileting task. unable to identify the 4 birthstones in her ring for which month they are for and then was unable to identify for which  family member they were for including her own. when asked about if she felt she ever got confused she said "oh yes honey" when asked for examples she said "im fine i dont leave things turned on if thats what youre talking about.  when asked about how she manages medications she states "i fill them in one of those things for the days"  i asked her how she knows how to do it.  she says "well you read the labels and do what it says".  states she fills her pill box every sun.        Exercises     Shoulder Instructions       General Comments      Pertinent Vitals/ Pain       Pain Assessment: No/denies  pain  Home Living                                          Prior Functioning/Environment              Frequency           Progress Toward Goals  OT Goals(current goals can now be found in the care plan section)  Progress towards OT goals: Progressing toward goals     Plan      Co-evaluation                 AM-PAC OT "6 Clicks" Daily Activity     Outcome Measure   Help from another person eating meals?: None Help from another person taking care of personal grooming?: A Little Help from another person toileting, which includes using toliet, bedpan, or urinal?: A Little Help from another person bathing (including washing, rinsing, drying)?: A Little Help from another person to put on and taking off regular upper body clothing?: A Little Help from another person to put on and taking off regular lower body clothing?: A Little 6 Click Score: 19    End of Session Equipment Utilized During Treatment: Rolling walker;Gait belt  OT Visit Diagnosis: Unsteadiness on feet (R26.81);Other symptoms and signs involving cognitive function   Activity Tolerance Patient tolerated treatment well   Patient Left in chair;with call bell/phone within reach;with chair alarm set   Nurse Communication          Time: [id number] OT Time Calculation (min): 25 min  Charges: OT General Charges $OT Visit: 1 Visit OT Treatments $Self Care/Home Management : 23-37 mins   Janice Coffin, COTA/L 06/03/2019, 8:41 AM

## 2019-06-03 NOTE — Progress Notes (Signed)
Patient's dressing was saturated. Dressing has been changed and patient is back in bed.

## 2019-06-03 NOTE — Care Management Important Message (Signed)
Important Message  Patient Details  Name: Taylor Tyler MRN: [id number] Date of Birth: 03-24-1941   Medicare Important Message Given:  Yes     [name] 06/03/2019, 12:53 PM

## 2019-06-03 NOTE — Progress Notes (Signed)
[facility] ROUNDING NOTE   Subjective:   Patient 78 year old lady type 2 diabetes diastolic dysfunction hypertension chronic disease stage IV anemia, hypothyroidism hyperlipidemia was admitted on 1026 after a fall at home.  She been complaining of increased dyspnea on exertion.  In the emergency room is found that she had acute cardiac ischemia with a BMP revealing a sodium 135 potassium 5.3 chloride 16 BUN 55 creatinine 5.74 CPK 364.  Blood pressure 141/87 pulse 59 temperature 98.5 O2 sats 100% room air  Sodium 136 potassium 4.2 chloride 108 CO2 15 BUN 61 creatinine 5.59 glucose 83 WBC 11.1 hemoglobin 7.8 platelets 396  Renal ultrasound was unremarkable.  No hydronephrosis  Home medications amlodipine 10 mg daily clonidine 0.1 mg twice daily iron sulfate 325 mg daily Lasix 40 mg twice daily Neurontin 300 mg nightly hydralazine 50 mg every 8 hours levothyroxine 100 mcg daily, Lipitor 40 mg daily labetalol 100 mg twice daily  Urine output 450 cc 06/02/2019   urinalysis showed greater than 300 mg/dL protein no RBCs    6-10 WBCs no M spike noted 09/07/2017    Objective:  Vital signs in last 24 hours:  Temp:  [97.9 F (36.6 C)-99.5 F (37.5 C)] 98.5 F (36.9 C) (10/30 1152) Pulse Rate:  [56-69] 59 (10/30 1152) Resp:  [18-20] 18 (10/30 1152) BP: (130-170)/(51-87) 141/87 (10/30 1152) SpO2:  [97 %-100 %] 100 % (10/30 1152) Weight:  [98.9 kg] 98.9 kg (10/30 0655)  Weight change: -0.862 kg Filed Weights   06/01/19 0458 06/02/19 0531 06/03/19 0655  Weight: 97.9 kg 99.7 kg 98.9 kg    Intake/Output: I/O last 3 completed shifts: In: 2376 [P.O.:1224] Out: 450 [Urine:450]   Intake/Output this shift:  Total I/O In: 240 [P.O.:240] Out: 600 [Urine:600]  General: chronically ill appearing, lying in bed in NAD HEENT: moist mucous membranes  Eyes: conjunctiva normal  Heart: RRR  Lungs: normal work of breathing; lungs decreased at bases, with fine crackles  Abdomen: BS+;  abdomen is obese, soft, non-tender, non-distended  Extremities: 2+ edema to knees bilaterally  Skin: warm and dry  Neuro: A&Ox3; answers questions and follows commands appropriately     Basic Metabolic Panel: Recent Labs  Lab 05/30/19 1046 05/31/19 0045 05/31/19 0408 06/01/19 0404 06/02/19 0825 06/03/19 0340  NA 136  --  136 136 133* 136  K 5.3*  --  4.6 4.3 4.3 4.2  CL 107  --  108 108 107 108  CO2 16*  --  16* 18* 16* 15*  GLUCOSE 103*  --  70 91 110* 83  BUN 55*  --  55* 54* 55* 61*  CREATININE 5.74* 6.13* 5.79* 5.68* 5.46* 5.59*  CALCIUM 8.6*  --  8.7* 8.2* 8.0* 8.1*  MG  --   --   --   --   --  1.8    Liver Function Tests: Recent Labs  Lab 05/31/19 0635  AST 27  ALT 16  ALKPHOS 51  BILITOT 0.5  PROT 7.2  ALBUMIN 2.0*   No results for input(s): LIPASE, AMYLASE in the last 168 hours. No results for input(s): AMMONIA in the last 168 hours.  CBC: Recent Labs  Lab 05/31/19 0045 05/31/19 0408 06/01/19 0404 06/02/19 0825 06/03/19 0340  WBC 14.7* 14.7* 12.8* 12.3* 11.1*  HGB 8.7* 8.3* 7.7* 7.3* 7.8*  HCT 28.2* 27.7* 25.6* 24.2* 25.6*  MCV 85.7 87.9 85.9 85.5 86.2  PLT 462* 468* 435* 401* 396    Cardiac Enzymes: Recent Labs  Lab 05/30/19  2056 05/31/19 0635  CKTOTAL 364* 283*    BNP: Invalid input(s): POCBNP  CBG: Recent Labs  Lab 06/02/19 0643 06/02/19 1209 06/02/19 1607 06/02/19 2122 06/03/19 0701  GLUCAP 92 180* 100* 94 116*    Microbiology: Results for orders placed or performed during the hospital encounter of 05/30/19  SARS CORONAVIRUS 2 (TAT 6-24 HRS) Nasopharyngeal Nasopharyngeal Swab     Status: None   Collection Time: 05/30/19 10:02 PM   Specimen: Nasopharyngeal Swab  Result Value Ref Range Status   SARS Coronavirus 2 NEGATIVE NEGATIVE Final    Comment: (NOTE) SARS-CoV-2 target nucleic acids are NOT DETECTED. The SARS-CoV-2 RNA is generally detectable in upper and lower respiratory specimens during the acute phase of  infection. Negative results do not preclude SARS-CoV-2 infection, do not rule out co-infections with other pathogens, and should not be used as the sole basis for treatment or other patient management decisions. Negative results must be combined with clinical observations, patient history, and epidemiological information. The expected result is Negative. Fact Sheet for Patients: SugarRoll.be Fact Sheet for Healthcare Providers: https://www.woods-mathews.com/ This test is not yet approved or cleared by the [place] FDA and  has been authorized for detection and/or diagnosis of SARS-CoV-2 by FDA under an Emergency Use Authorization (EUA). This EUA will remain  in effect (meaning this test can be used) for the duration of the COVID-19 declaration under Section 56 4(b)(1) of the Act, 21 U.S.C. section 360bbb-3(b)(1), unless the authorization is terminated or revoked sooner. Performed at [facility], [place] [address] [place], [place] [address]     Coagulation Studies: No results for input(s): LABPROT, INR in the last 72 hours.  Urinalysis: No results for input(s): COLORURINE, LABSPEC, PHURINE, GLUCOSEU, HGBUR, BILIRUBINUR, KETONESUR, PROTEINUR, UROBILINOGEN, NITRITE, LEUKOCYTESUR in the last 72 hours.  Invalid input(s): APPERANCEUR    Imaging: Dg Chest Port 1 View  Result Date: 06/02/2019 CLINICAL DATA:  Shortness of breath.  CHF EXAM: PORTABLE CHEST 1 VIEW COMPARISON:  Three days ago FINDINGS: Cardiomegaly and vascular congestion. There is no edema, consolidation, effusion, or pneumothorax. IMPRESSION: Cardiomegaly and vascular congestion.  No edema or focal pneumonia. Electronically Signed   By: Monte Fantasia M.D.   On: 06/02/2019 10:35     Medications:    . amLODipine  10 mg Oral Daily  . atorvastatin  40 mg Oral q1800  . cloNIDine  0.1 mg Oral BID  . ferrous sulfate  325 mg Oral Q breakfast  . furosemide  80 mg  Intravenous Q8H  . gabapentin  300 mg Oral QHS  . heparin  5,000 Units Subcutaneous Q8H  . hydrALAZINE  50 mg Oral Q8H  . insulin aspart  0-9 Units Subcutaneous TID WC  . labetalol  100 mg Oral BID  . levothyroxine  100 mcg Oral QAC breakfast   acetaminophen **OR** acetaminophen, hydrALAZINE, ondansetron **OR** ondansetron (ZOFRAN) IV  Assessment/ Plan:   Acute on chronic kidney disease baseline serum creatinine of 2.2.  Renal ultrasound no evidence of obstruction no evidence of active GN and urine sediment Fina 7.6.  Most likely acute tubular necrosis due to hypoperfusion from diastolic dysfunction and diastolic heart failure.  Does not appear that she is taking any ACE inhibitor's ARB use of nephrotoxins as an outpatient.  She is being aggressively diuresed with 80 mg of Lasix 3 times daily.  We will continue to follow  Hypertension/volume continue outpatient medications we will continue to follow with clonidine and amlodipine and hydralazine labetalol.  We will  continue to monitor urine output with 80 mg every 8 hours of furosemide.  Anemia continues on iron supplementation she does receive ESA as outpatient we will check iron studies.  Bones will check PTH although she is followed by Dr. Johnney Ou as an outpatient.  Lipidemia appears to be stable  Diabetes mellitus as per primary team   LOS: [place] @TODAY @11 :59 AM

## 2019-06-04 DIAGNOSIS — D649 Anemia, unspecified: Secondary | ICD-10-CM

## 2019-06-04 DIAGNOSIS — E039 Hypothyroidism, unspecified: Secondary | ICD-10-CM

## 2019-06-04 LAB — GLUCOSE, CAPILLARY
Glucose-Capillary: 88 mg/dL (ref 70–99)
Glucose-Capillary: 98 mg/dL (ref 70–99)
Glucose-Capillary: 103 mg/dL — ABNORMAL HIGH (ref 70–99)
Glucose-Capillary: 118 mg/dL — ABNORMAL HIGH (ref 70–99)

## 2019-06-04 LAB — FERRITIN: Ferritin: 608 ng/mL — ABNORMAL HIGH (ref 11–307)

## 2019-06-04 LAB — CBC
HCT: 24 % — ABNORMAL LOW (ref 36.0–46.0)
Hemoglobin: 7.4 g/dL — ABNORMAL LOW (ref 12.0–15.0)
MCH: 25.9 pg — ABNORMAL LOW (ref 26.0–34.0)
MCHC: 30.8 g/dL (ref 30.0–36.0)
MCV: 83.9 fL (ref 80.0–100.0)
Platelets: 363 10*3/uL (ref 150–400)
RBC: 2.86 MIL/uL — ABNORMAL LOW (ref 3.87–5.11)
RDW: 16.3 % — ABNORMAL HIGH (ref 11.5–15.5)
WBC: 12.7 10*3/uL — ABNORMAL HIGH (ref 4.0–10.5)
nRBC: 0 % (ref 0.0–0.2)

## 2019-06-04 LAB — BASIC METABOLIC PANEL
Anion gap: 11 (ref 5–15)
BUN: 66 mg/dL — ABNORMAL HIGH (ref 8–23)
CO2: 18 mmol/L — ABNORMAL LOW (ref 22–32)
Calcium: 8.2 mg/dL — ABNORMAL LOW (ref 8.9–10.3)
Chloride: 105 mmol/L (ref 98–111)
Creatinine, Ser: 5.66 mg/dL — ABNORMAL HIGH (ref 0.44–1.00)
GFR calc Af Amer: 8 mL/min — ABNORMAL LOW (ref >60)
GFR calc non Af Amer: 7 mL/min — ABNORMAL LOW (ref >60)
Glucose, Bld: 85 mg/dL (ref 70–99)
Potassium: 4.5 mmol/L (ref 3.5–5.1)
Sodium: 134 mmol/L — ABNORMAL LOW (ref 135–145)

## 2019-06-04 LAB — PARATHYROID HORMONE, INTACT (NO CA): PTH: 99 pg/mL — ABNORMAL HIGH (ref 15–65)

## 2019-06-04 MED ORDER — IPRATROPIUM BROMIDE 0.02 % IN SOLN: 0.5000 mg | Freq: Four times a day (QID) | RESPIRATORY_TRACT | Status: DC | PRN

## 2019-06-04 MED ORDER — CLONIDINE HCL 0.2 MG PO TABS
0.2000 mg | ORAL_TABLET | Freq: Three times a day (TID) | ORAL | Status: DC
  Administered 2019-06-04 – 2019-06-05 (×4): 0.2 mg via ORAL
  Filled 2019-06-04 (×6): qty 1

## 2019-06-04 MED ORDER — LEVALBUTEROL HCL 1.25 MG/0.5ML IN NEBU: 1.2500 mg | INHALATION_SOLUTION | Freq: Four times a day (QID) | RESPIRATORY_TRACT | Status: DC | PRN

## 2019-06-04 MED ORDER — SODIUM CHLORIDE 0.9 % IV SOLN
250.0000 mg | Freq: Every day | INTRAVENOUS | Status: AC
  Administered 2019-06-04 – 2019-06-07 (×4): 250 mg via INTRAVENOUS
  Filled 2019-06-04 (×4): qty 20

## 2019-06-04 MED ORDER — DARBEPOETIN ALFA 150 MCG/0.3ML IJ SOSY
150.0000 ug | PREFILLED_SYRINGE | INTRAMUSCULAR | Status: DC
  Administered 2019-06-04 – 2019-06-11 (×2): 150 ug via SUBCUTANEOUS
  Filled 2019-06-04 (×2): qty 0.3

## 2019-06-04 MED ORDER — LABETALOL HCL 200 MG PO TABS
200.0000 mg | ORAL_TABLET | Freq: Two times a day (BID) | ORAL | Status: DC
  Administered 2019-06-04 – 2019-07-07 (×47): 200 mg via ORAL
  Filled 2019-06-04 (×54): qty 1

## 2019-06-04 NOTE — Progress Notes (Signed)
[facility] ROUNDING NOTE   Subjective:   Patient 78 year old lady type 2 diabetes diastolic dysfunction hypertension chronic disease stage IV anemia, hypothyroidism hyperlipidemia was admitted on 1026 after a fall at home.  She been complaining of increased dyspnea on exertion.  In the emergency room is found that she had acute cardiac ischemia with a BMP revealing a sodium 135 potassium 5.3 chloride 16 BUN 55 creatinine 5.74 CPK 364.  She was being evaluated and treated for exacerbation of congestive heart failure.  Blood pressure 165/64 pulse 65 temperature 98.1 O2 sats 97% room air  Sodium 134 potassium 4.5 chloride 105 CO2 18 BUN 36 creatinine 5.66 glucose 85 calcium 8.2 WBC 12.7 hemoglobin 7.4 platelets 363  Renal ultrasound was unremarkable.  No hydronephrosis  Amlodipine 10 mg daily atorvastatin 40 mg daily clonidine 0 point milligram twice daily, Lasix 80 mg every 8 hours gabapentin 300 mg nightly hydralazine 50 mg every 8 hours, insulin sliding scale, labetalol 100 mg twice daily levothyroxine 100 mcg daily  Urine output 1.8 L 06/03/2019 urinalysis showed greater than 300 mg/dL protein no RBCs    6-10 WBCs no M spike noted 09/07/2017    Objective:  Vital signs in last 24 hours:  Temp:  [97.8 F (36.6 C)-100 F (37.8 C)] 98.1 F (36.7 C) (10/31 0948) Pulse Rate:  [59-75] 64 (10/31 0948) Resp:  [18] 18 (10/31 0550) BP: (141-190)/(63-87) 165/64 (10/31 0948) SpO2:  [97 %-100 %] 97 % (10/31 0550) Weight:  [108.9 kg] 108.9 kg (10/31 0541)  Weight change: 9.979 kg Filed Weights   06/02/19 0531 06/03/19 0655 06/04/19 0541  Weight: 99.7 kg 98.9 kg 108.9 kg    Intake/Output: I/O last 3 completed shifts: In: 1017 [P.O.:1017] Out: 2350 [Urine:2350]   Intake/Output this shift:  No intake/output data recorded.  General: chronically ill appearing, lying in bed in NAD HEENT: moist mucous membranes  Eyes: conjunctiva normal  Heart: RRR  Lungs: normal work of  breathing; lungs decreased at bases, with fine crackles  Abdomen: BS+; abdomen is obese, soft, non-tender, non-distended  Extremities: 2+ edema to knees bilaterally  Skin: warm and dry  Neuro: A&Ox3; answers questions and follows commands appropriately     Basic Metabolic Panel: Recent Labs  Lab 05/31/19 0408 06/01/19 0404 06/02/19 0825 06/03/19 0340 06/04/19 0645  NA 136 136 133* 136 134*  K 4.6 4.3 4.3 4.2 4.5  CL 108 108 107 108 105  CO2 16* 18* 16* 15* 18*  GLUCOSE 70 91 110* 83 85  BUN 55* 54* 55* 61* 66*  CREATININE 5.79* 5.68* 5.46* 5.59* 5.66*  CALCIUM 8.7* 8.2* 8.0* 8.1* 8.2*  MG  --   --   --  1.8  --     Liver Function Tests: Recent Labs  Lab 05/31/19 0635  AST 27  ALT 16  ALKPHOS 51  BILITOT 0.5  PROT 7.2  ALBUMIN 2.0*   No results for input(s): LIPASE, AMYLASE in the last 168 hours. No results for input(s): AMMONIA in the last 168 hours.  CBC: Recent Labs  Lab 05/31/19 0408 06/01/19 0404 06/02/19 0825 06/03/19 0340 06/04/19 0645  WBC 14.7* 12.8* 12.3* 11.1* 12.7*  HGB 8.3* 7.7* 7.3* 7.8* 7.4*  HCT 27.7* 25.6* 24.2* 25.6* 24.0*  MCV 87.9 85.9 85.5 86.2 83.9  PLT 468* 435* 401* 396 363    Cardiac Enzymes: Recent Labs  Lab 05/30/19 2056 05/31/19 0635  CKTOTAL 364* 283*    BNP: Invalid input(s): POCBNP  CBG: Recent Labs  Lab  06/03/19 1153 06/03/19 1559 06/03/19 1737 06/03/19 2104 06/04/19 0703  GLUCAP 80 125* 105* 85 88    Microbiology: Results for orders placed or performed during the hospital encounter of 05/30/19  SARS CORONAVIRUS 2 (TAT 6-24 HRS) Nasopharyngeal Nasopharyngeal Swab     Status: None   Collection Time: 05/30/19 10:02 PM   Specimen: Nasopharyngeal Swab  Result Value Ref Range Status   SARS Coronavirus 2 NEGATIVE NEGATIVE Final    Comment: (NOTE) SARS-CoV-2 target nucleic acids are NOT DETECTED. The SARS-CoV-2 RNA is generally detectable in upper and lower respiratory specimens during the acute phase of  infection. Negative results do not preclude SARS-CoV-2 infection, do not rule out co-infections with other pathogens, and should not be used as the sole basis for treatment or other patient management decisions. Negative results must be combined with clinical observations, patient history, and epidemiological information. The expected result is Negative. Fact Sheet for Patients: SugarRoll.be Fact Sheet for Healthcare Providers: https://www.woods-mathews.com/ This test is not yet approved or cleared by the [place] FDA and  has been authorized for detection and/or diagnosis of SARS-CoV-2 by FDA under an Emergency Use Authorization (EUA). This EUA will remain  in effect (meaning this test can be used) for the duration of the COVID-19 declaration under Section 56 4(b)(1) of the Act, 21 U.S.C. section 360bbb-3(b)(1), unless the authorization is terminated or revoked sooner. Performed at [facility], [place] [address], [place], [place] [address]     Coagulation Studies: No results for input(s): LABPROT, INR in the last 72 hours.  Urinalysis: No results for input(s): COLORURINE, LABSPEC, PHURINE, GLUCOSEU, HGBUR, BILIRUBINUR, KETONESUR, PROTEINUR, UROBILINOGEN, NITRITE, LEUKOCYTESUR in the last 72 hours.  Invalid input(s): APPERANCEUR    Imaging: Dg Chest Port 1 View  Result Date: 06/02/2019 CLINICAL DATA:  Shortness of breath.  CHF EXAM: PORTABLE CHEST 1 VIEW COMPARISON:  Three days ago FINDINGS: Cardiomegaly and vascular congestion. There is no edema, consolidation, effusion, or pneumothorax. IMPRESSION: Cardiomegaly and vascular congestion.  No edema or focal pneumonia. Electronically Signed   By: Monte Fantasia M.D.   On: 06/02/2019 10:35     Medications:    . amLODipine  10 mg Oral Daily  . atorvastatin  40 mg Oral q1800  . cloNIDine  0.1 mg Oral BID  . ferrous sulfate  325 mg Oral Q breakfast  . furosemide  80 mg  Intravenous Q8H  . gabapentin  300 mg Oral QHS  . heparin  5,000 Units Subcutaneous Q8H  . hydrALAZINE  50 mg Oral Q8H  . insulin aspart  0-9 Units Subcutaneous TID WC  . labetalol  100 mg Oral BID  . levothyroxine  100 mcg Oral QAC breakfast   acetaminophen **OR** acetaminophen, hydrALAZINE, ondansetron **OR** ondansetron (ZOFRAN) IV  Assessment/ Plan:   Acute on chronic kidney disease baseline serum creatinine of 2.2.  Renal ultrasound no evidence of obstruction no evidence of active GN and urine sediment Fina 7.6.  Most likely acute tubular necrosis due to hypoperfusion from diastolic dysfunction and diastolic heart failure.  Does not appear that she is taking any ACE inhibitor's ARB use of nephrotoxins as an outpatient.  She is being aggressively diuresed with 80 mg of Lasix every 8 hours.  We will continue to follow she does appear to be having a diuresis.  Although her creatinine appears to be unchanged with a GFR about 10 to 15 cc/min.  Hypertension/volume continue outpatient medications we will continue to follow with clonidine and amlodipine and hydralazine  labetalol.  Blood pressure does not appear to be well controlled we will discontinue clonidine increase labetalol to 200 mg twice daily.  We will continue to monitor urine output with 80 mg every 8 hours of furosemide.  Anemia continues on iron supplementation iron saturations 17% we will give IV iron  Bones will check PTH although she is followed by Dr. Johnney Ou as an outpatient.  Lipidemia appears to be stable  Diabetes mellitus as per primary team   LOS: [place] @TODAY @10 :22 AM

## 2019-06-04 NOTE — Progress Notes (Signed)
PROGRESS NOTE    Taylor Tyler  UKG:[id number] DOB: 04/26/1941 DOA: 05/30/2019 PCP: McLean-Scocuzza, Nino Glow, MD   Brief Narrative:  78 y.o.femalewithhistory of diabetes mellitus type 2 presently not on medication, hypertension, chronic kidney disease stage4, anemia, hypothyroidism, hyperlipidemia, nonhealing wound of the low back after surgery was brought to the ER the patient had a fall at home. Patient states on Sunday eveningshewas trying to get out of the bed when she slid off the bed and fell onto the floor following which patient was not able to get up. She was lying on the floor for at least 8 hours following which patient son came and knocked on the door. Patient had to crawl and open the door. Patient denies hitting her head or losing consciousness. Over the last few weeks patient has been feeling increasingly short of breathwith increasing lower extremity edema.She wasadmitted for acute on chronic diastolic heart failure.  Getting aggressively diuresed while monitoring her renal function.  Nephrology team following.   Assessment & Plan:   Principal Problem:   Acute CHF (congestive heart failure) (HCC) Active Problems:   Hypothyroidism   DM (diabetes mellitus), type 2 with renal complications (HCC)   Anemia   Acute renal failure superimposed on stage 3 chronic kidney disease (HCC)   Essential hypertension   Acute hypoxemic respiratory failure Acute on chronic congestive heart failure with preserved ejection fraction, 55 to 76%, grade 2 diastolic dysfunction, class III -[id number]-[id number]. VQ scan low risk for PE.  -C/o shortness of breath. O2 sat stable on room air. -Continue Lasix IV every 8 hours.  Monitor urine output.  Monitor renal function. -Chest x-ray from today shows cardiomegaly and vascular congestion -Added bronchodilators as needed.  Incentive spirometry.  Hypertensive urgency -Continue Lasix IV, Norvasc 10 mg, hydralazine 50 mg 3 times daily,  labetalol 200 mg twice daily. -Increase clonidine to 0.2mg  tid.   Acute kidney injury on CKD stageIV -Baseline creatinine 2.24, renal ultrasound negative.  Nephrology following -Creatinine today 5.66.  Diuresis with caution.  Fall and weakness -CK 283 -PT OTeval, no PT follow up recommended  Diabetes mellitus type 2, well controlled -Hemoglobin A1c5.2 -Sliding scale and Accu-Cheks.  Hypothyroidism -TSH elevated 5.569, free T4 normal 0.84. Recommend repeating as an outpatient -Continue Synthroid  Hyperlipidemia -Continue Lipitor  Non healing surgical wound of back -Seen by wound care team on 10/29.  1.    DVT prophylaxis: Heparin Code Status: Full code Family Communication: None at bedside Disposition Plan: Maintain hospital stay for aggressive diuresis while monitoring renal function.  Renal function still remains elevated.    Subjective: Has exertional shortness of breath.  Tells me she feels like she is wheezing.  Review of Systems Otherwise negative except as per HPI, including: General: Denies fever, chills, night sweats or unintended weight loss. Resp: Denies hemoptysis Cardiac: Denies chest pain, palpitations, orthopnea, paroxysmal nocturnal dyspnea. GI: Denies abdominal pain, nausea, vomiting, diarrhea or constipation GU: Denies dysuria, frequency, hesitancy or incontinence MS: Denies muscle aches, joint pain or swelling Neuro: Denies headache, neurologic deficits (focal weakness, numbness, tingling), abnormal gait Psych: Denies anxiety, depression, SI/HI/AVH Skin: Denies new rashes or lesions ID: Denies sick contacts, exotic exposures, travel  Objective: Vitals:   06/03/19 2022 06/04/19 0541 06/04/19 0550 06/04/19 0948  BP: (!) 190/67  (!) 167/63 (!) 165/64  Pulse: 75  66 64  Resp: 18  18   Temp: 100 F (37.8 C)  97.8 F (36.6 C) 98.1 F (36.7 C)  TempSrc: Oral  Oral Oral  SpO2: 100%  97%   Weight:  108.9 kg    Height:         Intake/Output Summary (Last 24 hours) at 06/04/2019 1130 Last data filed at 06/04/2019 0546 Gross per 24 hour  Intake 537 ml  Output 1750 ml  Net -1213 ml   Filed Weights   06/02/19 0531 06/03/19 0655 06/04/19 0541  Weight: 99.7 kg 98.9 kg 108.9 kg    Examination:  General exam: Appears calm and comfortable  Respiratory system: Bibasilar wheezing. Cardiovascular system: S1 & S2 heard, RRR. No JVD, murmurs, rubs, gallops or clicks.  1+ bilateral lower extremity pitting edema Gastrointestinal system: Abdomen is nondistended, soft and nontender. No organomegaly or masses felt. Normal bowel sounds heard. Central nervous system: Alert and oriented. No focal neurological deficits. Extremities: Symmetric 4 x 5 power. Skin: No rashes, lesions or ulcers Psychiatry: Judgement and insight appear normal. Mood & affect appropriate.   Female catheter in place. Unstageable wound on her lower back.  Data Reviewed:   CBC: Recent Labs  Lab 05/31/19 0408 06/01/19 0404 06/02/19 0825 06/03/19 0340 06/04/19 0645  WBC 14.7* 12.8* 12.3* 11.1* 12.7*  HGB 8.3* 7.7* 7.3* 7.8* 7.4*  HCT 27.7* 25.6* 24.2* 25.6* 24.0*  MCV 87.9 85.9 85.5 86.2 83.9  PLT 468* 435* 401* 396 027   Basic Metabolic Panel: Recent Labs  Lab 05/31/19 0408 06/01/19 0404 06/02/19 0825 06/03/19 0340 06/04/19 0645  NA 136 136 133* 136 134*  K 4.6 4.3 4.3 4.2 4.5  CL 108 108 107 108 105  CO2 16* 18* 16* 15* 18*  GLUCOSE 70 91 110* 83 85  BUN 55* 54* 55* 61* 66*  CREATININE 5.79* 5.68* 5.46* 5.59* 5.66*  CALCIUM 8.7* 8.2* 8.0* 8.1* 8.2*  MG  --   --   --  1.8  --    GFR: Estimated Creatinine Clearance: 10 mL/min (A) (by C-G formula based on SCr of 5.66 mg/dL (H)). Liver Function Tests: Recent Labs  Lab 05/31/19 0635  AST 27  ALT 16  ALKPHOS 51  BILITOT 0.5  PROT 7.2  ALBUMIN 2.0*   No results for input(s): LIPASE, AMYLASE in the last 168 hours. No results for input(s): AMMONIA in the last 168 hours.  Coagulation Profile: No results for input(s): INR, PROTIME in the last 168 hours. Cardiac Enzymes: Recent Labs  Lab 05/30/19 2056 05/31/19 0635  CKTOTAL 364* 283*   BNP (last 3 results) No results for input(s): PROBNP in the last 8760 hours. HbA1C: No results for input(s): HGBA1C in the last 72 hours. CBG: Recent Labs  Lab 06/03/19 1559 06/03/19 1737 06/03/19 2104 06/04/19 0703 06/04/19 1123  GLUCAP 125* 105* 85 88 98   Lipid Profile: No results for input(s): CHOL, HDL, LDLCALC, TRIG, CHOLHDL, LDLDIRECT in the last 72 hours. Thyroid Function Tests: No results for input(s): TSH, T4TOTAL, FREET4, T3FREE, THYROIDAB in the last 72 hours. Anemia Panel: Recent Labs    06/03/19 1236 06/04/19 0849  FERRITIN  --  608*  TIBC 136*  --   IRON 23*  --    Sepsis Labs: No results for input(s): PROCALCITON, LATICACIDVEN in the last 168 hours.  Recent Results (from the past 240 hour(s))  SARS CORONAVIRUS 2 (TAT 6-24 HRS) Nasopharyngeal Nasopharyngeal Swab     Status: None   Collection Time: 05/30/19 10:02 PM   Specimen: Nasopharyngeal Swab  Result Value Ref Range Status   SARS Coronavirus 2 NEGATIVE NEGATIVE Final    Comment: (NOTE) SARS-CoV-2 target nucleic acids  are NOT DETECTED. The SARS-CoV-2 RNA is generally detectable in upper and lower respiratory specimens during the acute phase of infection. Negative results do not preclude SARS-CoV-2 infection, do not rule out co-infections with other pathogens, and should not be used as the sole basis for treatment or other patient management decisions. Negative results must be combined with clinical observations, patient history, and epidemiological information. The expected result is Negative. Fact Sheet for Patients: SugarRoll.be Fact Sheet for Healthcare Providers: https://www.woods-mathews.com/ This test is not yet approved or cleared by the [place] FDA and  has been authorized  for detection and/or diagnosis of SARS-CoV-2 by FDA under an Emergency Use Authorization (EUA). This EUA will remain  in effect (meaning this test can be used) for the duration of the COVID-19 declaration under Section 56 4(b)(1) of the Act, 21 U.S.C. section 360bbb-3(b)(1), unless the authorization is terminated or revoked sooner. Performed at [facility], [place] [address] [place], [place] [address]          Radiology Studies: No results found.      Scheduled Meds: . amLODipine  10 mg Oral Daily  . atorvastatin  40 mg Oral q1800  . darbepoetin (ARANESP) injection - NON-DIALYSIS  150 mcg Subcutaneous Q Sat-1800  . ferrous sulfate  325 mg Oral Q breakfast  . furosemide  80 mg Intravenous Q8H  . gabapentin  300 mg Oral QHS  . heparin  5,000 Units Subcutaneous Q8H  . hydrALAZINE  50 mg Oral Q8H  . insulin aspart  0-9 Units Subcutaneous TID WC  . labetalol  200 mg Oral BID  . levothyroxine  100 mcg Oral QAC breakfast   Continuous Infusions: . ferric gluconate (FERRLECIT/NULECIT) IV       LOS: 5 days   Time spent= 35 mins    Dao Memmott Arsenio Loader, MD [facility]  If 7PM-7AM, please contact night-coverage  06/04/2019, 11:30 AM

## 2019-06-05 LAB — GLUCOSE, CAPILLARY
Glucose-Capillary: 98 mg/dL (ref 70–99)
Glucose-Capillary: 116 mg/dL — ABNORMAL HIGH (ref 70–99)
Glucose-Capillary: 116 mg/dL — ABNORMAL HIGH (ref 70–99)
Glucose-Capillary: 126 mg/dL — ABNORMAL HIGH (ref 70–99)

## 2019-06-05 LAB — CBC
HCT: 25.1 % — ABNORMAL LOW (ref 36.0–46.0)
Hemoglobin: 7.6 g/dL — ABNORMAL LOW (ref 12.0–15.0)
MCH: 25.6 pg — ABNORMAL LOW (ref 26.0–34.0)
MCHC: 30.3 g/dL (ref 30.0–36.0)
MCV: 84.5 fL (ref 80.0–100.0)
Platelets: 376 10*3/uL (ref 150–400)
RBC: 2.97 MIL/uL — ABNORMAL LOW (ref 3.87–5.11)
RDW: 16.2 % — ABNORMAL HIGH (ref 11.5–15.5)
WBC: 13.7 10*3/uL — ABNORMAL HIGH (ref 4.0–10.5)
nRBC: 0 % (ref 0.0–0.2)

## 2019-06-05 LAB — COMPREHENSIVE METABOLIC PANEL
ALT: 18 U/L (ref 0–44)
AST: 21 U/L (ref 15–41)
Albumin: 1.7 g/dL — ABNORMAL LOW (ref 3.5–5.0)
Alkaline Phosphatase: 41 U/L (ref 38–126)
Anion gap: 12 (ref 5–15)
BUN: 72 mg/dL — ABNORMAL HIGH (ref 8–23)
CO2: 17 mmol/L — ABNORMAL LOW (ref 22–32)
Calcium: 8.5 mg/dL — ABNORMAL LOW (ref 8.9–10.3)
Chloride: 105 mmol/L (ref 98–111)
Creatinine, Ser: 5.91 mg/dL — ABNORMAL HIGH (ref 0.44–1.00)
GFR calc Af Amer: 7 mL/min — ABNORMAL LOW (ref >60)
GFR calc non Af Amer: 6 mL/min — ABNORMAL LOW (ref >60)
Glucose, Bld: 93 mg/dL (ref 70–99)
Potassium: 4.6 mmol/L (ref 3.5–5.1)
Sodium: 134 mmol/L — ABNORMAL LOW (ref 135–145)
Total Bilirubin: 0.4 mg/dL (ref 0.3–1.2)
Total Protein: 6.5 g/dL (ref 6.5–8.1)

## 2019-06-05 LAB — MAGNESIUM: Magnesium: 1.7 mg/dL (ref 1.7–2.4)

## 2019-06-05 MED ORDER — FUROSEMIDE 10 MG/ML IJ SOLN
120.0000 mg | Freq: Three times a day (TID) | INTRAVENOUS | Status: DC
  Administered 2019-06-05 – 2019-06-06 (×3): 120 mg via INTRAVENOUS
  Filled 2019-06-05: qty 12
  Filled 2019-06-05 (×3): qty 10

## 2019-06-05 NOTE — Progress Notes (Signed)
[facility] ROUNDING NOTE   Subjective:   Patient 78 year old lady type 2 diabetes diastolic dysfunction hypertension chronic disease stage IV anemia, hypothyroidism hyperlipidemia was admitted on 1026 after a fall at home.  She been complaining of increased dyspnea on exertion.  In the emergency room is found that she had acute cardiac ischemia with a BMP revealing a sodium 135 potassium 5.3 chloride 16 BUN 55 creatinine 5.74 CPK 364.  She was being evaluated and treated for exacerbation of congestive heart failure.  Blood pressure 151/61 pulse 56 temperature 97.8 O2 sats 100% room air  Urine output 1000 cc 06/04/2019  Sodium 134 potassium 4.6 chloride 105 CO2 17 BUN 72 creatinine 5.91 glucose 93 calcium 8.5 magnesium 1.7 albumin 1.7 WBC 13.7 hemoglobin 7.6 platelets 376  Renal ultrasound was unremarkable.  No hydronephrosis  Amlodipine 10 mg daily atorvastatin 40 mg daily clonidine 0.2 3 times daily, Lasix 80 mg every 8 hours gabapentin 300 mg nightly hydralazine 50 mg every 8 hours, insulin sliding scale, labetalol 200 mg twice daily levothyroxine 100 mcg daily  Urine output 1.8 L 06/03/2019 urinalysis showed greater than 300 mg/dL protein no RBCs    6-10 WBCs no M spike noted 09/07/2017    Objective:  Vital signs in last 24 hours:  Temp:  [97.8 F (36.6 C)-99 F (37.2 C)] 97.8 F (36.6 C) (11/01 0177) Pulse Rate:  [54-61] 55 (11/01 0911) Resp:  [18-20] 20 (11/01 9390) BP: (142-161)/(54-77) 151/61 (11/01 0911) SpO2:  [99 %-100 %] 100 % (11/01 0911) Weight:  [99.2 kg] 99.2 kg (11/01 0309)  Weight change: -9.662 kg Filed Weights   06/03/19 0655 06/04/19 0541 06/05/19 0309  Weight: 98.9 kg 108.9 kg 99.2 kg    Intake/Output: I/O last 3 completed shifts: In: 720 [P.O.:720] Out: 1950 [Urine:1950]   Intake/Output this shift:  Total I/O In: 240 [P.O.:240] Out: -   General: chronically ill appearing, lying in bed in NAD HEENT: moist mucous membranes  Eyes:  conjunctiva normal  Heart: RRR  Lungs: normal work of breathing; lungs decreased at bases, with fine crackles  Abdomen: BS+; abdomen is obese, soft, non-tender, non-distended  Extremities: 2+ edema to knees bilaterally  Skin: warm and dry  Neuro: A&Ox3; answers questions and follows commands appropriately     Basic Metabolic Panel: Recent Labs  Lab 06/01/19 0404 06/02/19 0825 06/03/19 0340 06/04/19 0645 06/05/19 0527  NA 136 133* 136 134* 134*  K 4.3 4.3 4.2 4.5 4.6  CL 108 107 108 105 105  CO2 18* 16* 15* 18* 17*  GLUCOSE 91 110* 83 85 93  BUN 54* 55* 61* 66* 72*  CREATININE 5.68* 5.46* 5.59* 5.66* 5.91*  CALCIUM 8.2* 8.0* 8.1* 8.2* 8.5*  MG  --   --  1.8  --  1.7    Liver Function Tests: Recent Labs  Lab 05/31/19 0635 06/05/19 0527  AST 27 21  ALT 16 18  ALKPHOS 51 41  BILITOT 0.5 0.4  PROT 7.2 6.5  ALBUMIN 2.0* 1.7*   No results for input(s): LIPASE, AMYLASE in the last 168 hours. No results for input(s): AMMONIA in the last 168 hours.  CBC: Recent Labs  Lab 06/01/19 0404 06/02/19 0825 06/03/19 0340 06/04/19 0645 06/05/19 0527  WBC 12.8* 12.3* 11.1* 12.7* 13.7*  HGB 7.7* 7.3* 7.8* 7.4* 7.6*  HCT 25.6* 24.2* 25.6* 24.0* 25.1*  MCV 85.9 85.5 86.2 83.9 84.5  PLT 435* 401* 396 363 376    Cardiac Enzymes: Recent Labs  Lab 05/30/19 2056  05/31/19 0635  CKTOTAL 364* 283*    BNP: Invalid input(s): POCBNP  CBG: Recent Labs  Lab 06/04/19 0703 06/04/19 1123 06/04/19 1652 06/04/19 2134 06/05/19 0632  GLUCAP 88 98 103* 118* 98    Microbiology: Results for orders placed or performed during the hospital encounter of 05/30/19  SARS CORONAVIRUS 2 (TAT 6-24 HRS) Nasopharyngeal Nasopharyngeal Swab     Status: None   Collection Time: 05/30/19 10:02 PM   Specimen: Nasopharyngeal Swab  Result Value Ref Range Status   SARS Coronavirus 2 NEGATIVE NEGATIVE Final    Comment: (NOTE) SARS-CoV-2 target nucleic acids are NOT DETECTED. The SARS-CoV-2 RNA  is generally detectable in upper and lower respiratory specimens during the acute phase of infection. Negative results do not preclude SARS-CoV-2 infection, do not rule out co-infections with other pathogens, and should not be used as the sole basis for treatment or other patient management decisions. Negative results must be combined with clinical observations, patient history, and epidemiological information. The expected result is Negative. Fact Sheet for Patients: SugarRoll.be Fact Sheet for Healthcare Providers: https://www.woods-mathews.com/ This test is not yet approved or cleared by the [place] FDA and  has been authorized for detection and/or diagnosis of SARS-CoV-2 by FDA under an Emergency Use Authorization (EUA). This EUA will remain  in effect (meaning this test can be used) for the duration of the COVID-19 declaration under Section 56 4(b)(1) of the Act, 21 U.S.C. section 360bbb-3(b)(1), unless the authorization is terminated or revoked sooner. Performed at [facility], [place] [address] [place], [place] [address]     Coagulation Studies: No results for input(s): LABPROT, INR in the last 72 hours.  Urinalysis: No results for input(s): COLORURINE, LABSPEC, PHURINE, GLUCOSEU, HGBUR, BILIRUBINUR, KETONESUR, PROTEINUR, UROBILINOGEN, NITRITE, LEUKOCYTESUR in the last 72 hours.  Invalid input(s): APPERANCEUR    Imaging: No results found.   Medications:   . ferric gluconate (FERRLECIT/NULECIT) IV 250 mg (06/04/19 1213)   . amLODipine  10 mg Oral Daily  . atorvastatin  40 mg Oral q1800  . cloNIDine  0.2 mg Oral TID  . darbepoetin (ARANESP) injection - NON-DIALYSIS  150 mcg Subcutaneous Q Sat-1800  . ferrous sulfate  325 mg Oral Q breakfast  . furosemide  80 mg Intravenous Q8H  . gabapentin  300 mg Oral QHS  . heparin  5,000 Units Subcutaneous Q8H  . hydrALAZINE  50 mg Oral Q8H  . insulin aspart  0-9 Units  Subcutaneous TID WC  . labetalol  200 mg Oral BID  . levothyroxine  100 mcg Oral QAC breakfast   acetaminophen **OR** acetaminophen, hydrALAZINE, ipratropium, levalbuterol, ondansetron **OR** ondansetron (ZOFRAN) IV  Assessment/ Plan:   Acute on chronic kidney disease baseline serum creatinine of 2.2.  Renal ultrasound no evidence of obstruction no evidence of active GN and urine sediment Fina 7.6.  Most likely acute tubular necrosis due to hypoperfusion from diastolic dysfunction and diastolic heart failure.  Does not appear that she is taking any ACE inhibitor's ARB use of nephrotoxins as an outpatient.  She is being aggressively diuresed with 80 mg of Lasix every 8 hours.  We will continue to follow she does appear to be having a diuresis.  Although her creatinine appears to be unchanged with a GFR about 10 to 15 cc/min.  I anticipate that we will need to perform vein mapping and consult vein and vascular surgery 06/06/2019 for placement of permanent access.  She does not appear to be in need of dialysis at this  time  Hypertension/volume continue outpatient medications we will continue to follow with clonidine and amlodipine and hydralazine labetalol.  Blood pressure a little improved.  It appears that the clonidine is now increased to 0.2 mg 3 times daily labetalol 200 mg twice daily.  We will continue to monitor urine output with 80 mg every 8 hours of furosemide.  Anemia continues on iron supplementation iron saturations 17% we will give IV iron  Bones PTH 99.  Lipidemia appears to be stable  Diabetes mellitus as per primary team   LOS: [place] @TODAY @9 :30 AM

## 2019-06-05 NOTE — Progress Notes (Signed)
PROGRESS NOTE    Taylor Tyler  TKP:[id number] DOB: 1941/07/19 DOA: 05/30/2019 PCP: McLean-Scocuzza, Nino Glow, MD      Brief Narrative:  Taylor Tyler is a 78 y.o. F with DM, HTN, CKD IV baseline Cr 2.5, anemia, hypothyroidism, and nonhealing lower back wound after surgery who presented with fall and leg pain.  In the ER, Cr >5.  She was noted to have lower extremity edema, which she said has been accumulating for several weeks as well as progressive shortness of breath.         Assessment & Plan:  Acute on chronic renal failure Baseline creatinine 2.2-2.5.  Renal ultrasound unremarkable.  Nephrology consulted.  Creatinine slightly worse with diuresis -Daily BMP    Acute on chronic diastolic CHF Net -517 cc yesterday creatinine slightly worse, potassium stable. -Furosemide 80 mg IV TID -Strict I/Os, daily weights, telemetry  -Daily monitoring renal function    Hypothyroidism -Continue levothyroxine  Diabetes Glucose good -Continue sliding scale correction insulin -Continue atorvastatin, gabapentin  Anemia of chronic kidney disease Hemoglobin stable relative to baseline -Continue iron  Hypertension Hypertensive urgency -Continue Lasix, amlodipine, hydralazine, labetalol -Hold clonidine given worsening bradycardia      MDM and disposition: The below labs and imaging reports were reviewed and summarized above.  Medication management as above.  This is a severe exacerbation of a life-threatening illness.  The patient was admitted with acute on chronic renal failure.  Nephrology are following.  At present no indications for dialysis, although I suspect that she is failing diuresis, and this may need to be considered in the next few days.         DVT prophylaxis: Heparin Code Status: Full code Family Communication:     Consultants:   Nephrology  Procedures:     Antimicrobials:       Subjective: Patient still somewhat swollen, she still feels  tired, malaise.  No confusion, fever, sputum.  Objective: Vitals:   06/05/19 0309 06/05/19 0633 06/05/19 0911 06/05/19 1203  BP:  (!) 142/54 (!) 151/61 (!) 138/57  Pulse:  (!) 54 (!) 55 (!) 53  Resp:  20  20  Temp:  97.8 F (36.6 C)  97.6 F (36.4 C)  TempSrc:  Oral  Oral  SpO2:  99% 100% 98%  Weight: 99.2 kg     Height: 5\' 3"  (1.6 m)       Intake/Output Summary (Last 24 hours) at 06/05/2019 1708 Last data filed at 06/05/2019 0900 Gross per 24 hour  Intake 480 ml  Output 900 ml  Net -420 ml   Filed Weights   06/03/19 0655 06/04/19 0541 06/05/19 0309  Weight: 98.9 kg 108.9 kg 99.2 kg    Examination: General appearance:  adult female, alert and in no obvious distress.  Appears tired. HEENT: Anicteric, conjunctiva pink, lids and lashes normal. No nasal deformity, discharge, epistaxis.  Lips moist.   Skin: Warm and dry.  No jaundice.  No suspicious rashes or lesions. Cardiac: RRR, nl S1-S2, no murmurs appreciated.  Capillary refill is brisk.  JVP normal.  1+ LE edema.  Radia  pulses 2+ and symmetric. Respiratory: Normal respiratory rate and rhythm.  CTAB without rales or wheezes. Abdomen: Abdomen soft.  No TTP or guarding. No ascites, distension, hepatosplenomegaly.   MSK: No deformities or effusions. Neuro: Awake and alert  EOMI, moves all extremities. Speech fluent.    Psych: Sensorium intact and responding to questions, attention normal. Affect normal.  Judgment and insight appear normal.  Data Reviewed: I have personally reviewed following labs and imaging studies:  CBC: Recent Labs  Lab 06/01/19 0404 06/02/19 0825 06/03/19 0340 06/04/19 0645 06/05/19 0527  WBC 12.8* 12.3* 11.1* 12.7* 13.7*  HGB 7.7* 7.3* 7.8* 7.4* 7.6*  HCT 25.6* 24.2* 25.6* 24.0* 25.1*  MCV 85.9 85.5 86.2 83.9 84.5  PLT 435* 401* 396 363 841   Basic Metabolic Panel: Recent Labs  Lab 06/01/19 0404 06/02/19 0825 06/03/19 0340 06/04/19 0645 06/05/19 0527  NA 136 133* 136 134* 134*  K  4.3 4.3 4.2 4.5 4.6  CL 108 107 108 105 105  CO2 18* 16* 15* 18* 17*  GLUCOSE 91 110* 83 85 93  BUN 54* 55* 61* 66* 72*  CREATININE 5.68* 5.46* 5.59* 5.66* 5.91*  CALCIUM 8.2* 8.0* 8.1* 8.2* 8.5*  MG  --   --  1.8  --  1.7   GFR: Estimated Creatinine Clearance: 9.1 mL/min (A) (by C-G formula based on SCr of 5.91 mg/dL (H)). Liver Function Tests: Recent Labs  Lab 05/31/19 0635 06/05/19 0527  AST 27 21  ALT 16 18  ALKPHOS 51 41  BILITOT 0.5 0.4  PROT 7.2 6.5  ALBUMIN 2.0* 1.7*   No results for input(s): LIPASE, AMYLASE in the last 168 hours. No results for input(s): AMMONIA in the last 168 hours. Coagulation Profile: No results for input(s): INR, PROTIME in the last 168 hours. Cardiac Enzymes: Recent Labs  Lab 05/30/19 2056 05/31/19 0635  CKTOTAL 364* 283*   BNP (last 3 results) No results for input(s): PROBNP in the last 8760 hours. HbA1C: No results for input(s): HGBA1C in the last 72 hours. CBG: Recent Labs  Lab 06/04/19 1652 06/04/19 2134 06/05/19 0632 06/05/19 1201 06/05/19 1628  GLUCAP 103* 118* 98 116* 116*   Lipid Profile: No results for input(s): CHOL, HDL, LDLCALC, TRIG, CHOLHDL, LDLDIRECT in the last 72 hours. Thyroid Function Tests: No results for input(s): TSH, T4TOTAL, FREET4, T3FREE, THYROIDAB in the last 72 hours. Anemia Panel: Recent Labs    06/03/19 1236 06/04/19 0849  FERRITIN  --  608*  TIBC 136*  --   IRON 23*  --    Urine analysis:    Component Value Date/Time   COLORURINE YELLOW 05/31/2019 0029   APPEARANCEUR CLEAR 05/31/2019 0029   APPEARANCEUR Cloudy 12/14/2013 1727   LABSPEC 1.020 05/31/2019 0029   LABSPEC 1.012 12/14/2013 1727   PHURINE 6.0 05/31/2019 0029   GLUCOSEU NEGATIVE 05/31/2019 0029   GLUCOSEU NEGATIVE 07/21/2017 1001   HGBUR SMALL (A) 05/31/2019 0029   BILIRUBINUR NEGATIVE 05/31/2019 0029   BILIRUBINUR Negative 12/14/2013 1727   KETONESUR 15 (A) 05/31/2019 0029   PROTEINUR >300 (A) 05/31/2019 0029    UROBILINOGEN 0.2 07/21/2017 1001   NITRITE NEGATIVE 05/31/2019 0029   LEUKOCYTESUR NEGATIVE 05/31/2019 0029   LEUKOCYTESUR 3+ 12/14/2013 1727   Sepsis Labs: @LABRCNTIP (procalcitonin:4,lacticacidven:4)  ) Recent Results (from the past 240 hour(s))  SARS CORONAVIRUS 2 (TAT 6-24 HRS) Nasopharyngeal Nasopharyngeal Swab     Status: None   Collection Time: 05/30/19 10:02 PM   Specimen: Nasopharyngeal Swab  Result Value Ref Range Status   SARS Coronavirus 2 NEGATIVE NEGATIVE Final    Comment: (NOTE) SARS-CoV-2 target nucleic acids are NOT DETECTED. The SARS-CoV-2 RNA is generally detectable in upper and lower respiratory specimens during the acute phase of infection. Negative results do not preclude SARS-CoV-2 infection, do not rule out co-infections with other pathogens, and should not be used as the sole basis for treatment or other patient  management decisions. Negative results must be combined with clinical observations, patient history, and epidemiological information. The expected result is Negative. Fact Sheet for Patients: SugarRoll.be Fact Sheet for Healthcare Providers: https://www.woods-mathews.com/ This test is not yet approved or cleared by the [place] FDA and  has been authorized for detection and/or diagnosis of SARS-CoV-2 by FDA under an Emergency Use Authorization (EUA). This EUA will remain  in effect (meaning this test can be used) for the duration of the COVID-19 declaration under Section 56 4(b)(1) of the Act, 21 U.S.C. section 360bbb-3(b)(1), unless the authorization is terminated or revoked sooner. Performed at [facility], [place] [address] [place], [place] [address]          Radiology Studies: No results found.      Scheduled Meds: . amLODipine  10 mg Oral Daily  . atorvastatin  40 mg Oral q1800  . cloNIDine  0.2 mg Oral TID  . darbepoetin (ARANESP) injection - NON-DIALYSIS  150 mcg  Subcutaneous Q Sat-1800  . ferrous sulfate  325 mg Oral Q breakfast  . gabapentin  300 mg Oral QHS  . heparin  5,000 Units Subcutaneous Q8H  . hydrALAZINE  50 mg Oral Q8H  . insulin aspart  0-9 Units Subcutaneous TID WC  . labetalol  200 mg Oral BID  . levothyroxine  100 mcg Oral QAC breakfast   Continuous Infusions: . ferric gluconate (FERRLECIT/NULECIT) IV 250 mg (06/05/19 1042)  . furosemide       LOS: 6 days    Time spent: 35 minutes    Edwin Dada, MD [facility] 06/05/2019, 5:08 PM     Please page through AMION:  [email or web address] Password [email or web address] If 7PM-7AM, please contact night-coverage

## 2019-06-06 ENCOUNTER — Inpatient Hospital Stay (HOSPITAL_COMMUNITY): Payer: Medicare Other

## 2019-06-06 DIAGNOSIS — I5033 Acute on chronic diastolic (congestive) heart failure: Secondary | ICD-10-CM

## 2019-06-06 DIAGNOSIS — N186 End stage renal disease: Secondary | ICD-10-CM

## 2019-06-06 LAB — CBC
HCT: 24.6 % — ABNORMAL LOW (ref 36.0–46.0)
Hemoglobin: 7.7 g/dL — ABNORMAL LOW (ref 12.0–15.0)
MCH: 26 pg (ref 26.0–34.0)
MCHC: 31.3 g/dL (ref 30.0–36.0)
MCV: 83.1 fL (ref 80.0–100.0)
Platelets: 351 10*3/uL (ref 150–400)
RBC: 2.96 MIL/uL — ABNORMAL LOW (ref 3.87–5.11)
RDW: 16.2 % — ABNORMAL HIGH (ref 11.5–15.5)
WBC: 16.1 10*3/uL — ABNORMAL HIGH (ref 4.0–10.5)
nRBC: 0 % (ref 0.0–0.2)

## 2019-06-06 LAB — COMPREHENSIVE METABOLIC PANEL
ALT: 16 U/L (ref 0–44)
AST: 18 U/L (ref 15–41)
Albumin: 1.6 g/dL — ABNORMAL LOW (ref 3.5–5.0)
Alkaline Phosphatase: 45 U/L (ref 38–126)
Anion gap: 13 (ref 5–15)
BUN: 81 mg/dL — ABNORMAL HIGH (ref 8–23)
CO2: 17 mmol/L — ABNORMAL LOW (ref 22–32)
Calcium: 8.2 mg/dL — ABNORMAL LOW (ref 8.9–10.3)
Chloride: 103 mmol/L (ref 98–111)
Creatinine, Ser: 5.86 mg/dL — ABNORMAL HIGH (ref 0.44–1.00)
GFR calc Af Amer: 7 mL/min — ABNORMAL LOW (ref >60)
GFR calc non Af Amer: 6 mL/min — ABNORMAL LOW (ref >60)
Glucose, Bld: 94 mg/dL (ref 70–99)
Potassium: 4.5 mmol/L (ref 3.5–5.1)
Sodium: 133 mmol/L — ABNORMAL LOW (ref 135–145)
Total Bilirubin: 0.4 mg/dL (ref 0.3–1.2)
Total Protein: 6.3 g/dL — ABNORMAL LOW (ref 6.5–8.1)

## 2019-06-06 LAB — ECHOCARDIOGRAM COMPLETE
Height: 63 in
Weight: 4000 oz

## 2019-06-06 LAB — GLUCOSE, CAPILLARY
Glucose-Capillary: 90 mg/dL (ref 70–99)
Glucose-Capillary: 95 mg/dL (ref 70–99)
Glucose-Capillary: 119 mg/dL — ABNORMAL HIGH (ref 70–99)
Glucose-Capillary: 128 mg/dL — ABNORMAL HIGH (ref 70–99)

## 2019-06-06 LAB — MAGNESIUM: Magnesium: 1.7 mg/dL (ref 1.7–2.4)

## 2019-06-06 MED ORDER — FUROSEMIDE 10 MG/ML IJ SOLN
80.0000 mg | Freq: Two times a day (BID) | INTRAMUSCULAR | Status: DC
  Administered 2019-06-06 – 2019-06-08 (×4): 80 mg via INTRAVENOUS
  Filled 2019-06-06 (×4): qty 8

## 2019-06-06 NOTE — Progress Notes (Signed)
VASCULAR LAB PRELIMINARY  PRELIMINARY  PRELIMINARY  PRELIMINARY  Upper extremity vein mapping completed.    Preliminary report:  See CV proc for preliminary results.   Savalas Monje, RVT 06/06/2019, 7:31 PM

## 2019-06-06 NOTE — Progress Notes (Signed)
Physical Therapy Treatment Patient Details Name: Taylor Tyler MRN: [id number] DOB: Mar 13, 1941 Today's Date: 06/06/2019    History of Present Illness Pt is a 78 y.o. female admitted 05/30/19 after fall at home, laying on the floor at least 8 hours until found by son, also with worsening SOB and LE edema. VQ scan with low probability for PE. Worked up for HF. Other PMH includes CKD III, DM2, DJD s/p lumbar sx (2018), arthritis.    PT Comments    Pt with improved ambulation tolerance today to 70 feet with RW. Pt cont to have SOB however SpO2 >99% on RA. Pt requiring supervision for transfers and increased time. Pt reports he family check in on her frequently. Acute PT to cont to follow.    Follow Up Recommendations  Home health PT;Supervision for mobility/OOB     Equipment Recommendations  None recommended by PT    Recommendations for Other Services       Precautions / Restrictions Precautions Precautions: Fall Restrictions Weight Bearing Restrictions: No    Mobility  Bed Mobility Overal bed mobility: Needs Assistance Bed Mobility: Rolling;Sidelying to Sit Rolling: Supervision Sidelying to sit: Supervision       General bed mobility comments: definite use of bed rail, increased time, mildly labored but no physical assist needed  Transfers Overall transfer level: Needs assistance Equipment used: Rolling walker (2 wheeled) Transfers: Sit to/from Stand Sit to Stand: Min guard         General transfer comment: verbal cues for safe hand placement  Ambulation/Gait Ambulation/Gait assistance: Min guard Gait Distance (Feet): 70 Feet Assistive device: Rolling walker (2 wheeled) Gait Pattern/deviations: Step-through pattern;Decreased stride length Gait velocity: Decreased Gait velocity interpretation: 1.31 - 2.62 ft/sec, indicative of limited community ambulator General Gait Details: slow, 3 standing rest breaks, SpO2 >98% on RA despite DOE, reports 2/4.   Stairs              Wheelchair Mobility    Modified Rankin (Stroke Patients Only)       Balance Overall balance assessment: Needs assistance Sitting-balance support: Feet supported;No upper extremity supported Sitting balance-Leahy Scale: Fair     Standing balance support: Bilateral upper extremity supported;No upper extremity supported;During functional activity Standing balance-Leahy Scale: Fair Standing balance comment: pt able to statically stand without UE support but dependent on RW for safe ambulation                            Cognition Arousal/Alertness: Awake/alert(initially sleeping but easily awoken ) Behavior During Therapy: WFL for tasks assessed/performed Overall Cognitive Status: Within Functional Limits for tasks assessed                                 General Comments: pt with appropriate conversation       Exercises      General Comments General comments (skin integrity, edema, etc.): VSS      Pertinent Vitals/Pain Pain Assessment: No/denies pain    Home Living                      Prior Function            PT Goals (current goals can now be found in the care plan section) Acute Rehab PT Goals Patient Stated Goal: go home Progress towards PT goals: Progressing toward goals    Frequency    Min  3X/week      PT Plan Current plan remains appropriate    Co-evaluation              AM-PAC PT "6 Clicks" Mobility   Outcome Measure  Help needed turning from your back to your side while in a flat bed without using bedrails?: None Help needed moving from lying on your back to sitting on the side of a flat bed without using bedrails?: A Little Help needed moving to and from a bed to a chair (including a wheelchair)?: A Little Help needed standing up from a chair using your arms (e.g., wheelchair or bedside chair)?: A Little Help needed to walk in hospital room?: A Little Help needed climbing 3-5 steps with a  railing? : A Little 6 Click Score: 19    End of Session Equipment Utilized During Treatment: Gait belt Activity Tolerance: Patient limited by fatigue Patient left: in bed;with call bell/phone within reach Nurse Communication: Mobility status PT Visit Diagnosis: Other abnormalities of gait and mobility (R26.89)     Time: 6147-0929 PT Time Calculation (min) (ACUTE ONLY): 26 min  Charges:  $Gait Training: 8-22 mins $Therapeutic Activity: 8-22 mins                     Kittie Plater, PT, DPT Acute Rehabilitation Services Pager #: [phone] Office #: [phone]    Berline Lopes 06/06/2019, 9:09 AM

## 2019-06-06 NOTE — Progress Notes (Signed)
  Echocardiogram 2D Echocardiogram has been performed.  Taylor Tyler 06/06/2019, 4:39 PM

## 2019-06-06 NOTE — Progress Notes (Signed)
Occupational Therapy Treatment Patient Details Name: Taylor Tyler MRN: [id number] DOB: 1941-06-02 Today's Date: 06/06/2019    History of present illness Pt is a 78 y.o. female admitted 05/30/19 after fall at home, laying on the floor at least 8 hours until found by son, also with worsening SOB and LE edema. VQ scan with low probability for PE. Worked up for HF. Other PMH includes CKD III, DM2, DJD s/p lumbar sx (2018), arthritis.   OT comments  Pt unable to conform to requirements of pill box cognitive assessment, simulating setting up medications for one week. Pt repeating,"This is not anything like what I do at home." Pt has 2 individual boxes and fills them according to how they look. Recommended pt consider medical alert button be added to her current home alarm system and/or adding a walker basket so she may keep her phone nearby at all times in the event she were to fall. Pt receptive.   Follow Up Recommendations  Home health OT;Supervision/Assistance - 24 hour    Equipment Recommendations  None recommended by OT    Recommendations for Other Services      Precautions / Restrictions Precautions Precautions: Fall Restrictions Weight Bearing Restrictions: No       Mobility Bed Mobility Overal bed mobility: Modified Independent             General bed mobility comments: increased time, HOB up, use of rail  Transfers                      Balance     Sitting balance-Leahy Scale: Good                                     ADL either performed or assessed with clinical judgement   ADL                                         General ADL Comments: Recommended pt consider medical alert system added to her current alarm system     Vision       Perception     Praxis      Cognition Arousal/Alertness: Awake/alert Behavior During Therapy: WFL for tasks assessed/performed                                    General Comments: pt with appropriate conversation, attempted to administer pill box test to pt, pt repeating "this isn't anything like I do at home."        Exercises     Shoulder Instructions       General Comments      Pertinent Vitals/ Pain       Pain Assessment: Faces Faces Pain Scale: Hurts little more Pain Location: L knee Pain Descriptors / Indicators: Sore Pain Intervention(s): Monitored during session;Repositioned;Patient requesting pain meds-RN notified  Home Living                                          Prior Functioning/Environment              Frequency  Min 2X/week  Progress Toward Goals  OT Goals(current goals can now be found in the care plan section)  Progress towards OT goals: Progressing toward goals  Acute Rehab OT Goals Patient Stated Goal: go home OT Goal Formulation: With patient Time For Goal Achievement: 06/15/19 Potential to Achieve Goals: Good  Plan Discharge plan remains appropriate    Co-evaluation                 AM-PAC OT "6 Clicks" Daily Activity     Outcome Measure   Help from another person eating meals?: None Help from another person taking care of personal grooming?: A Little Help from another person toileting, which includes using toliet, bedpan, or urinal?: A Little Help from another person bathing (including washing, rinsing, drying)?: A Little Help from another person to put on and taking off regular upper body clothing?: None Help from another person to put on and taking off regular lower body clothing?: A Little 6 Click Score: 20    End of Session    OT Visit Diagnosis: Unsteadiness on feet (R26.81);Other symptoms and signs involving cognitive function   Activity Tolerance Patient tolerated treatment well   Patient Left in bed;with call bell/phone within reach   Nurse Communication Patient requests pain meds        Time: 3545-6256 OT Time Calculation (min): 23  min  Charges: OT General Charges $OT Visit: 1 Visit OT Treatments $Self Care/Home Management : 23-37 mins  Nestor Lewandowsky, OTR/L Acute Rehabilitation Services Pager: [phone] Office: [phone]   [name] 06/06/2019, 1:10 PM

## 2019-06-06 NOTE — Progress Notes (Signed)
PROGRESS NOTE    Taylor Tyler  KZS:[id number] DOB: 1941/07/12 DOA: 05/30/2019 PCP: McLean-Scocuzza, Nino Glow, MD      Brief Narrative:  Taylor Tyler is a 78 y.o. F with DM, HTN, CKD IV baseline Cr 2.5, anemia, hypothyroidism, and nonhealing lower back wound after surgery who presented with fall and leg pain.  In the ER, Cr >5.  She was noted to have lower extremity edema, which she said has been accumulating for several weeks as well as progressive shortness of breath.   She was admitted for AoCRF, fluid overload.      Assessment & Plan:  Acute on chronic renal failure Baseline creatinine 2.2-2.5.  Urinalysis bland.  Renal ultrasound unremarkable.  Nephrology consulted.    Urine output per day: 700 cc > 350 cc > 2350 cc  >700 cc > 2300 cc yesterday Creatinine trend with diuresis: 5.74 > 6.13 > 5.79 > 5.68 > 5.46 > 5.59 > 5.66 > 5.91 > 5.86 BUN up to 81, bicarb 17, K normal  -Consult Nephrology, appreciate expert cares -Strict I/Os, daily BMP   Acute on chronic diastolic CHF Maybe cardiorenal syndrome, although may be renal failure driving fluid overload primarily.   Urine output better yesterday, net -1.4L yesterday.  Cr stable, potassium stable. Still severe edema -Obtain echo -Continue IV Furosemide  -Strict I/Os, daily weights, telemetry  -Daily monitoring renal function    Hypothyroidism TSH slightly high, free T4 normal -Continue levothyroxine  Diabetes GLucose well controlled -Continue sliding scale correction insulin -Continue atorvastatin, gabapentin  Anemia of chronic kidney disease Hemoglobin stable relative to baseline -Continue iron  Hypertension Hypertensive urgency BP slightly high -Continue Lasix, amlodipine, hydralazine, labetalol -Stop clonidine given bradycardia worsened      MDM and disposition: The below labs and imaging reports reviewed and summarized above.  Medication management as above. This is a severe exacerbation of chronic  disease, not yet improving.  The patient was admitted with acute on chronic renal failure.  Nephrology are following.  At present no indications for dialysis.    Will obtain echo and continue diuresis and close electrolyte, renal function monitoring.         DVT prophylaxis: Heparin Code Status: Full code Family Communication:     Consultants:   Nephrology  Procedures:   11/2 echo pending  Antimicrobials:       Subjective: Still swollen, tired.  No confusion, fever, sputum, chest pain, palpitations.  No orthopnea or PND.       Objective: Vitals:   06/06/19 0457 06/06/19 0636 06/06/19 0914 06/06/19 1226  BP: (!) 147/60 (!) 160/60 138/65 (!) 157/58  Pulse: (!) 56 (!) 55 (!) 59 60  Resp: 16   18  Temp: 97.7 F (36.5 C)   98.3 F (36.8 C)  TempSrc: Oral   Oral  SpO2: 97%   99%  Weight: 113.4 kg     Height:        Intake/Output Summary (Last 24 hours) at 06/06/2019 1901 Last data filed at 06/06/2019 1700 Gross per 24 hour  Intake 903.67 ml  Output 1200 ml  Net -296.33 ml   Filed Weights   06/04/19 0541 06/05/19 0309 06/06/19 0457  Weight: 108.9 kg 99.2 kg 113.4 kg    Examination: General appearance: well nourished adult female, sleeping but easily rousable, and in no acute distress.   HEENT: Anicteric, conjunctiva pink, lids and lashes normal. No nasal deformity, discharge, epistaxis.  Lips moist, dentures in place. OP normal, no oral lesions.  Skin: Warm and dry.  No suspicious rashes or lesions. Cardiac: RRR, no murmurs appreciated.  3+ LE edema.    Respiratory: Normal respiratory rate and rhythm.  Lung sounds diminished.CTAB without rales or wheezes. Abdomen: Abdomen soft.  No ttp or guarding. No ascites, distension, hepatosplenomegaly.   MSK: No deformities or effusions of the large joints of the upper or lower extremities bilaterally. Neuro: Awake and alert. Naming is grossly intact, and the patient's recall, recent and remote, as well as  general fund of knowledge seem within normal limits.  Muscle tone normal, without fasciculations.  Moves all extremities equally and with normal coordination.  Marland Kitchen Speech fluent.    Psych: Sensorium intact and responding to questions, attention normal. Affect normal.  Judgment and insight appear normal.      Data Reviewed: I have personally reviewed following labs and imaging studies:  CBC: Recent Labs  Lab 06/02/19 0825 06/03/19 0340 06/04/19 0645 06/05/19 0527 06/06/19 0634  WBC 12.3* 11.1* 12.7* 13.7* 16.1*  HGB 7.3* 7.8* 7.4* 7.6* 7.7*  HCT 24.2* 25.6* 24.0* 25.1* 24.6*  MCV 85.5 86.2 83.9 84.5 83.1  PLT 401* 396 363 376 161   Basic Metabolic Panel: Recent Labs  Lab 06/02/19 0825 06/03/19 0340 06/04/19 0645 06/05/19 0527 06/06/19 0634  NA 133* 136 134* 134* 133*  K 4.3 4.2 4.5 4.6 4.5  CL 107 108 105 105 103  CO2 16* 15* 18* 17* 17*  GLUCOSE 110* 83 85 93 94  BUN 55* 61* 66* 72* 81*  CREATININE 5.46* 5.59* 5.66* 5.91* 5.86*  CALCIUM 8.0* 8.1* 8.2* 8.5* 8.2*  MG  --  1.8  --  1.7 1.7   GFR: Estimated Creatinine Clearance: 9.9 mL/min (A) (by C-G formula based on SCr of 5.86 mg/dL (H)). Liver Function Tests: Recent Labs  Lab 05/31/19 0635 06/05/19 0527 06/06/19 0634  AST 27 21 18   ALT 16 18 16   ALKPHOS 51 41 45  BILITOT 0.5 0.4 0.4  PROT 7.2 6.5 6.3*  ALBUMIN 2.0* 1.7* 1.6*   No results for input(s): LIPASE, AMYLASE in the last 168 hours. No results for input(s): AMMONIA in the last 168 hours. Coagulation Profile: No results for input(s): INR, PROTIME in the last 168 hours. Cardiac Enzymes: Recent Labs  Lab 05/30/19 2056 05/31/19 0635  CKTOTAL 364* 283*   BNP (last 3 results) No results for input(s): PROBNP in the last 8760 hours. HbA1C: No results for input(s): HGBA1C in the last 72 hours. CBG: Recent Labs  Lab 06/05/19 1628 06/05/19 2130 06/06/19 0604 06/06/19 1105 06/06/19 1614  GLUCAP 116* 126* 90 95 119*   Lipid Profile: No results  for input(s): CHOL, HDL, LDLCALC, TRIG, CHOLHDL, LDLDIRECT in the last 72 hours. Thyroid Function Tests: No results for input(s): TSH, T4TOTAL, FREET4, T3FREE, THYROIDAB in the last 72 hours. Anemia Panel: Recent Labs    06/04/19 0849  FERRITIN 608*   Urine analysis:    Component Value Date/Time   COLORURINE YELLOW 05/31/2019 0029   APPEARANCEUR CLEAR 05/31/2019 0029   APPEARANCEUR Cloudy 12/14/2013 1727   LABSPEC 1.020 05/31/2019 0029   LABSPEC 1.012 12/14/2013 1727   PHURINE 6.0 05/31/2019 0029   GLUCOSEU NEGATIVE 05/31/2019 0029   GLUCOSEU NEGATIVE 07/21/2017 1001   HGBUR SMALL (A) 05/31/2019 0029   BILIRUBINUR NEGATIVE 05/31/2019 0029   BILIRUBINUR Negative 12/14/2013 1727   KETONESUR 15 (A) 05/31/2019 0029   PROTEINUR >300 (A) 05/31/2019 0029   UROBILINOGEN 0.2 07/21/2017 1001   NITRITE NEGATIVE 05/31/2019 0029  LEUKOCYTESUR NEGATIVE 05/31/2019 0029   LEUKOCYTESUR 3+ 12/14/2013 1727   Sepsis Labs: @LABRCNTIP (procalcitonin:4,lacticacidven:4)  ) Recent Results (from the past 240 hour(s))  SARS CORONAVIRUS 2 (TAT 6-24 HRS) Nasopharyngeal Nasopharyngeal Swab     Status: None   Collection Time: 05/30/19 10:02 PM   Specimen: Nasopharyngeal Swab  Result Value Ref Range Status   SARS Coronavirus 2 NEGATIVE NEGATIVE Final    Comment: (NOTE) SARS-CoV-2 target nucleic acids are NOT DETECTED. The SARS-CoV-2 RNA is generally detectable in upper and lower respiratory specimens during the acute phase of infection. Negative results do not preclude SARS-CoV-2 infection, do not rule out co-infections with other pathogens, and should not be used as the sole basis for treatment or other patient management decisions. Negative results must be combined with clinical observations, patient history, and epidemiological information. The expected result is Negative. Fact Sheet for Patients: SugarRoll.be Fact Sheet for Healthcare Providers:  https://www.woods-mathews.com/ This test is not yet approved or cleared by the [place] FDA and  has been authorized for detection and/or diagnosis of SARS-CoV-2 by FDA under an Emergency Use Authorization (EUA). This EUA will remain  in effect (meaning this test can be used) for the duration of the COVID-19 declaration under Section 56 4(b)(1) of the Act, 21 U.S.C. section 360bbb-3(b)(1), unless the authorization is terminated or revoked sooner. Performed at [name], [place] [address], [place], [place] [address]          Radiology Studies: No results found.      Scheduled Meds: . amLODipine  10 mg Oral Daily  . atorvastatin  40 mg Oral q1800  . darbepoetin (ARANESP) injection - NON-DIALYSIS  150 mcg Subcutaneous Q Sat-1800  . ferrous sulfate  325 mg Oral Q breakfast  . furosemide  80 mg Intravenous Q12H  . gabapentin  300 mg Oral QHS  . heparin  5,000 Units Subcutaneous Q8H  . hydrALAZINE  50 mg Oral Q8H  . insulin aspart  0-9 Units Subcutaneous TID WC  . labetalol  200 mg Oral BID  . levothyroxine  100 mcg Oral QAC breakfast   Continuous Infusions: . ferric gluconate (FERRLECIT/NULECIT) IV 250 mg (06/06/19 1027)     LOS: 7 days    Time spent: 35 minutes   Edwin Dada, MD [facility] 06/06/2019, 7:01 PM     Please page through [place]:  [email or web address] Password [email or web address] If 7PM-7AM, please contact night-coverage

## 2019-06-06 NOTE — Progress Notes (Addendum)
[facility] NEPHROLOG[facility] PROGRESS NOTE  Assessment/ Plan: Pt is a 77 y.o. yo female with history of DM, HTN, CHF, anemia, HLD,CKD 4 admitted with CHF exacerbation, consulted for AKI on CKD.  #AKI on CKD stage IV, nonoliguric: Baseline creatinine seems to be around 2.2.  AKI likely due to CHF exacerbation.  UA with WBC and proteinuria, no RBC.  Kidney ultrasound with stable echogenic kidney and mildly atrophy consistent with CKD.  No hydronephrosis. The peak creatinine level was 6.1  Creatinine level slightly trending down to 5.8 today with IV Lasix.  Patient is nonoliguric with urine output around 2.3 L/24 hrs.  She has trace lower extremity edema otherwise looks comfortable and in room air today.  BUN elevated to 81.  I will lower the dose of Lasix to 80 mg twice daily today.  Patient reports that she follows with Dr. [facility]ney Ou at [facility].  I called the office to obtain the medical record.  Plan for vein mapping noted.  No urgent indication for dialysis today.  # Anemia due to CKD: Iron saturation 17%.  Currently on IV iron.  Continue ESA.  Monitor CBC.  # Secondary hyperparathyroidism: PTH level iron.  Check phosphorus level.  # HTN/volume: Volume status improving with IV diuretics.  Receiving Lasix 120 mg every 8 hours.  Lower Lasix to 80 mg twice a day today.  Continue current antihypertensive medications.  Subjective: Seen and examined at bedside.  Patient reported feeling much better.  Denied chest pain, shortness of breath, nausea vomiting. Objective Vital signs in last 24 hours: Vitals:   06/06/19 0457 06/06/19 0636 06/06/19 0914 06/06/19 1226  BP: (!) 147/60 (!) 160/60 138/65 (!) 157/58  Pulse: (!) 56 (!) 55 (!) 59 60  Resp: 16   18  Temp: 97.7 F (36.5 C)   98.3 F (36.8 C)  TempSrc: Oral   Oral  SpO2: 97%   99%  Weight: 113.4 kg     Height:       Weight change: 14.2 kg  Intake/Output Summary (Last 24 hours) at 06/06/2019 1342 Last data filed at  06/06/2019 0854 Gross per 24 hour  Intake 613.67 ml  Output 1800 ml  Net -1186.33 ml       Labs: Basic Metabolic Panel: Recent Labs  Lab 06/04/19 0645 06/05/19 0527 06/06/19 0634  NA 134* 134* 133*  K 4.5 4.6 4.5  CL 105 105 103  CO2 18* 17* 17*  GLUCOSE 85 93 94  BUN 66* 72* 81*  CREATININE 5.66* 5.91* 5.86*  CALCIUM 8.2* 8.5* 8.2*   Liver Function Tests: Recent Labs  Lab 05/31/19 0635 06/05/19 0527 06/06/19 0634  AST 27 21 18   ALT 16 18 16   ALKPHOS 51 41 45  BILITOT 0.5 0.4 0.4  PROT 7.2 6.5 6.3*  ALBUMIN 2.0* 1.7* 1.6*   No results for input(s): LIPASE, AMYLASE in the last 168 hours. No results for input(s): AMMONIA in the last 168 hours. CBC: Recent Labs  Lab 06/02/19 0825 06/03/19 0340 06/04/19 0645 06/05/19 0527 06/06/19 0634  WBC 12.3* 11.1* 12.7* 13.7* 16.1*  HGB 7.3* 7.8* 7.4* 7.6* 7.7*  HCT 24.2* 25.6* 24.0* 25.1* 24.6*  MCV 85.5 86.2 83.9 84.5 83.1  PLT 401* 396 363 376 351   Cardiac Enzymes: Recent Labs  Lab 05/30/19 2056 05/31/19 0635  CKTOTAL 364* 283*   CBG: Recent Labs  Lab 06/05/19 1201 06/05/19 1628 06/05/19 2130 06/06/19 0604 06/06/19 1105  GLUCAP 116* 116* 126* 90 95  Iron Studies:  Recent Labs    06/04/19 0849  FERRITIN 608*   Studies/Results: No results found.  Medications: Infusions: . ferric gluconate (FERRLECIT/NULECIT) IV 250 mg (06/06/19 1027)    Scheduled Medications: . amLODipine  10 mg Oral Daily  . atorvastatin  40 mg Oral q1800  . darbepoetin (ARANESP) injection - NON-DIALYSIS  150 mcg Subcutaneous Q Sat-1800  . ferrous sulfate  325 mg Oral Q breakfast  . furosemide  80 mg Intravenous Q12H  . gabapentin  300 mg Oral QHS  . heparin  5,000 Units Subcutaneous Q8H  . hydrALAZINE  50 mg Oral Q8H  . insulin aspart  0-9 Units Subcutaneous TID WC  . labetalol  200 mg Oral BID  . levothyroxine  100 mcg Oral QAC breakfast    have reviewed scheduled and prn medications.  Physical  Exam: General:NAD, comfortable Heart:RRR, s1s2 nl, no rub Lungs:clear b/l, no crackle Abdomen:soft, Non-tender, non-distended Extremities: Lower extremity minimal pitting edema Neurology: Alert, awake and following commands.  No asterixis  Jeremiyah Cullens Prasad Malone Admire 06/06/2019,1:42 PM  LOS: 7 days  Pager: [phone]

## 2019-06-07 DIAGNOSIS — R600 Localized edema: Secondary | ICD-10-CM

## 2019-06-07 DIAGNOSIS — I509 Heart failure, unspecified: Secondary | ICD-10-CM

## 2019-06-07 DIAGNOSIS — N178 Other acute kidney failure: Secondary | ICD-10-CM

## 2019-06-07 LAB — RENAL FUNCTION PANEL
Albumin: 1.6 g/dL — ABNORMAL LOW (ref 3.5–5.0)
Anion gap: 12 (ref 5–15)
BUN: 88 mg/dL — ABNORMAL HIGH (ref 8–23)
CO2: 17 mmol/L — ABNORMAL LOW (ref 22–32)
Calcium: 8.1 mg/dL — ABNORMAL LOW (ref 8.9–10.3)
Chloride: 104 mmol/L (ref 98–111)
Creatinine, Ser: 6.01 mg/dL — ABNORMAL HIGH (ref 0.44–1.00)
GFR calc Af Amer: 7 mL/min — ABNORMAL LOW (ref >60)
GFR calc non Af Amer: 6 mL/min — ABNORMAL LOW (ref >60)
Glucose, Bld: 96 mg/dL (ref 70–99)
Phosphorus: 6.2 mg/dL — ABNORMAL HIGH (ref 2.5–4.6)
Potassium: 4.5 mmol/L (ref 3.5–5.1)
Sodium: 133 mmol/L — ABNORMAL LOW (ref 135–145)

## 2019-06-07 LAB — CBC
HCT: 25.4 % — ABNORMAL LOW (ref 36.0–46.0)
Hemoglobin: 7.7 g/dL — ABNORMAL LOW (ref 12.0–15.0)
MCH: 25.8 pg — ABNORMAL LOW (ref 26.0–34.0)
MCHC: 30.3 g/dL (ref 30.0–36.0)
MCV: 84.9 fL (ref 80.0–100.0)
Platelets: 385 10*3/uL (ref 150–400)
RBC: 2.99 MIL/uL — ABNORMAL LOW (ref 3.87–5.11)
RDW: 16.4 % — ABNORMAL HIGH (ref 11.5–15.5)
WBC: 18.1 10*3/uL — ABNORMAL HIGH (ref 4.0–10.5)
nRBC: 0 % (ref 0.0–0.2)

## 2019-06-07 LAB — MAGNESIUM: Magnesium: 1.6 mg/dL — ABNORMAL LOW (ref 1.7–2.4)

## 2019-06-07 LAB — GLUCOSE, CAPILLARY
Glucose-Capillary: 92 mg/dL (ref 70–99)
Glucose-Capillary: 89 mg/dL (ref 70–99)
Glucose-Capillary: 79 mg/dL (ref 70–99)
Glucose-Capillary: 101 mg/dL — ABNORMAL HIGH (ref 70–99)

## 2019-06-07 MED ORDER — SODIUM CHLORIDE 0.9 % IV SOLN
1.5000 g | INTRAVENOUS | Status: AC
  Administered 2019-06-08: 1.5 g via INTRAVENOUS
  Filled 2019-06-07: qty 1.5

## 2019-06-07 MED ORDER — SODIUM BICARBONATE 650 MG PO TABS
1300.0000 mg | ORAL_TABLET | Freq: Two times a day (BID) | ORAL | Status: DC
  Administered 2019-06-07 – 2019-06-08 (×3): 1300 mg via ORAL
  Filled 2019-06-07 (×3): qty 2

## 2019-06-07 MED ORDER — METOLAZONE 5 MG PO TABS
5.0000 mg | ORAL_TABLET | Freq: Once | ORAL | Status: AC
  Administered 2019-06-07: 5 mg via ORAL
  Filled 2019-06-07: qty 1

## 2019-06-07 NOTE — Progress Notes (Addendum)
[facility] PROGRESS NOTE  Assessment/ Plan: Pt is a 78 y.o. yo female with history of DM, HTN, CHF, anemia, HLD,CKD 4 admitted with CHF exacerbation, consulted for AKI on CKD.  #AKI on CKD stage IV, nonoliguric: Baseline creatinine seems to be around 3.  AKI likely due to CHF exacerbation.  UA with WBC and proteinuria, no RBC.  Kidney ultrasound with stable echogenic kidney and mildly atrophy consistent with CKD.  No hydronephrosis.  The patient was followed by Dr. Johnney Ou at [facility] when discussion was  started about possibly starting dialysis soon. -She is nonoliguric, urine output 1100 cc in 24 hours however BUN and serum creatinine level worsening today.  I will add a dose of metolazone today in addition to current IV diuretics.  She has generalized weakness but no overt uremic features to warrant urgent dialysis.  I will repeat lab tomorrow morning.  If her renal function continues to decline then we will initiate dialysis.  Patient agreed to start dialysis if needed.  I will keep her npo midnight. Will consult [facility] for perm access, may also need TDC depending on am lab.  #Metabolic acidosis: Start sodium bicarbonate.  # Anemia due to CKD: Iron saturation 17%.  Currently on IV iron.  Continue ESA.  Monitor CBC.  # Secondary hyperparathyroidism: PTH level 99.  Monitor phosphorus level.  # HTN/volume: Volume status improving with IV diuretics.  Diuretics as above.  Continue current antihypertensive medications.  Subjective: Seen and examined at bedside.  Reports generalized weakness however denied nausea vomiting chest pain shortness of breath.  No headache.  Nonoliguric. Objective Vital signs in last 24 hours: Vitals:   06/06/19 1226 06/06/19 2053 06/07/19 0605 06/07/19 1214  BP: (!) 157/58 (!) 169/51 (!) 145/53 (!) 148/59  Pulse: 60 65 66 60  Resp: 18 18 16 20   Temp: 98.3 F (36.8 C) 98.9 F (37.2 C) 98.9 F (37.2 C) 98.3 F (36.8 C)  TempSrc: Oral Oral Oral  Oral  SpO2: 99% 94% 97% 100%  Weight:   94.3 kg   Height:       Weight change: -5.652 kg  Intake/Output Summary (Last 24 hours) at 06/07/2019 1417 Last data filed at 06/07/2019 1007 Gross per 24 hour  Intake 920 ml  Output 800 ml  Net 120 ml       Labs: Basic Metabolic Panel: Recent Labs  Lab 06/05/19 0527 06/06/19 0634 06/07/19 0345  NA 134* 133* 133*  K 4.6 4.5 4.5  CL 105 103 104  CO2 17* 17* 17*  GLUCOSE 93 94 96  BUN 72* 81* 88*  CREATININE 5.91* 5.86* 6.01*  CALCIUM 8.5* 8.2* 8.1*  PHOS  --   --  6.2*   Liver Function Tests: Recent Labs  Lab 06/05/19 0527 06/06/19 0634 06/07/19 0345  AST 21 18  --   ALT 18 16  --   ALKPHOS 41 45  --   BILITOT 0.4 0.4  --   PROT 6.5 6.3*  --   ALBUMIN 1.7* 1.6* 1.6*   No results for input(s): LIPASE, AMYLASE in the last 168 hours. No results for input(s): AMMONIA in the last 168 hours. CBC: Recent Labs  Lab 06/03/19 0340 06/04/19 0645 06/05/19 0527 06/06/19 0634 06/07/19 0345  WBC 11.1* 12.7* 13.7* 16.1* 18.1*  HGB 7.8* 7.4* 7.6* 7.7* 7.7*  HCT 25.6* 24.0* 25.1* 24.6* 25.4*  MCV 86.2 83.9 84.5 83.1 84.9  PLT 396 363 376 351 385   Cardiac Enzymes: No results for  input(s): CKTOTAL, CKMB, CKMBINDEX, TROPONINI in the last 168 hours. CBG: Recent Labs  Lab 06/06/19 1105 06/06/19 1614 06/06/19 2139 06/07/19 0602 06/07/19 1130  GLUCAP 95 119* 128* 92 89    Iron Studies:  No results for input(s): IRON, TIBC, TRANSFERRIN, FERRITIN in the last 72 hours. Studies/Results: Vas [place] Upper Ext Vein Mapping (pre-op Avf)  Result Date: 06/07/2019 UPPER EXTREMITY VEIN MAPPING  Indications: Pre-access. Comparison Study: No prior Performing Technologist: Sharion Dove RVS  Examination Guidelines: A complete evaluation includes B-mode imaging, spectral Doppler, color Doppler, and power Doppler as needed of all accessible portions of each vessel. Bilateral testing is considered an integral part of a complete examination.  Limited examinations for reoccurring indications may be performed as noted. +-----------------+-------------+----------+---------+ Right Cephalic   Diameter (cm)Depth (cm)Findings  +-----------------+-------------+----------+---------+ Prox upper arm       0.28        0.87             +-----------------+-------------+----------+---------+ Mid upper arm        0.27        0.43             +-----------------+-------------+----------+---------+ Dist upper arm       0.28        0.46   branching +-----------------+-------------+----------+---------+ Antecubital fossa    0.47        0.75   branching +-----------------+-------------+----------+---------+ Prox forearm         0.42        0.48             +-----------------+-------------+----------+---------+ Mid forearm          0.31        0.61   branching +-----------------+-------------+----------+---------+ Wrist                0.14        0.49             +-----------------+-------------+----------+---------+ +-----------------+-------------+----------+---------+ Left Cephalic    Diameter (cm)Depth (cm)Findings  +-----------------+-------------+----------+---------+ Prox upper arm       0.43        1.46             +-----------------+-------------+----------+---------+ Mid upper arm        0.40        1.00             +-----------------+-------------+----------+---------+ Dist upper arm       0.42        0.74             +-----------------+-------------+----------+---------+ Antecubital fossa    0.51        0.27             +-----------------+-------------+----------+---------+ Prox forearm         0.35        0.36   branching +-----------------+-------------+----------+---------+ Mid forearm          0.38        0.39   branching +-----------------+-------------+----------+---------+ Wrist                0.31        0.34             +-----------------+-------------+----------+---------+  *See table(s) above for measurements and observations.  Diagnosing physician: Ruta Hinds MD Electronically signed by Ruta Hinds MD on 06/07/2019 at 9:32:51 AM.    Final     Medications: Infusions:   Scheduled Medications: . amLODipine  10 mg Oral Daily  . atorvastatin  40 mg Oral q1800  . darbepoetin (ARANESP) injection - NON-DIALYSIS  150 mcg Subcutaneous Q Sat-1800  . ferrous sulfate  325 mg Oral Q breakfast  . furosemide  80 mg Intravenous Q12H  . gabapentin  300 mg Oral QHS  . heparin  5,000 Units Subcutaneous Q8H  . hydrALAZINE  50 mg Oral Q8H  . insulin aspart  0-9 Units Subcutaneous TID WC  . labetalol  200 mg Oral BID  . levothyroxine  100 mcg Oral QAC breakfast  . metolazone  5 mg Oral Once  . sodium bicarbonate  1,300 mg Oral BID    have reviewed scheduled and prn medications.  Physical Exam: General:NAD, comfortable Heart:RRR, s1s2 nl, no rub Lungs: Clear b/l, no crackle Abdomen:soft, Non-tender, non-distended Extremities: Lower extremity minimal pitting edema Neurology: Alert, awake and following commands.  No asterixis  Dron Prasad Bhandari 06/07/2019,2:17 PM  LOS: 8 days  Pager: [phone]

## 2019-06-07 NOTE — Consult Note (Addendum)
[facility] [place] NOTE   MRN : [id number]  Reason for Consult: AKI on CKD Referring Physician: Dr. Carolin Sicks  History of Present Illness: 78 y/o female with AKI on CKD now in need of HD we have been asked to provide [name] and permanent access.   She denise weakness loss of sensation or pain in B UE.  She denise chest implants.    Past medical history includes:DM, HTN, CHF, anemia, HLD with CHF exacerbation.       Current Facility-Administered Medications  Medication Dose Route Frequency Provider Last Rate Last Dose  . acetaminophen (TYLENOL) tablet 650 mg  650 mg Oral Q6H PRN Rise Patience, MD   650 mg at 06/06/19 1309   Or  . acetaminophen (TYLENOL) suppository 650 mg  650 mg Rectal Q6H PRN Rise Patience, MD      . amLODipine (NORVASC) tablet 10 mg  10 mg Oral Daily Rise Patience, MD   10 mg at 06/07/19 0814  . atorvastatin (LIPITOR) tablet 40 mg  40 mg Oral q1800 Rise Patience, MD   40 mg at 06/06/19 1724  . Darbepoetin Alfa (ARANESP) injection 150 mcg  150 mcg Subcutaneous Q Sat-1800 Edrick Oh, MD   150 mcg at 06/04/19 1735  . ferrous sulfate tablet 325 mg  325 mg Oral Q breakfast Rise Patience, MD   325 mg at 06/07/19 0814  . furosemide (LASIX) injection 80 mg  80 mg Intravenous Q12H Rosita Fire, MD   80 mg at 06/07/19 0814  . gabapentin (NEURONTIN) capsule 300 mg  300 mg Oral QHS Rise Patience, MD   300 mg at 06/06/19 2138  . heparin injection 5,000 Units  5,000 Units Subcutaneous Q8H Rise Patience, MD   5,000 Units at 06/07/19 [phone]  . hydrALAZINE (APRESOLINE) injection 10 mg  10 mg Intravenous Q4H PRN Rise Patience, MD   10 mg at 05/31/19 0651  . hydrALAZINE (APRESOLINE) tablet 50 mg  50 mg Oral Q8H Rise Patience, MD   50 mg at 06/07/19 [phone]  . insulin aspart (novoLOG) injection 0-9 Units  0-9 Units Subcutaneous TID WC Dessa Phi, DO   2 Units at 06/02/19 1221  . ipratropium  (ATROVENT) nebulizer solution 0.5 mg  0.5 mg Nebulization Q6H PRN Amin, Ankit Chirag, MD      . labetalol (NORMODYNE) tablet 200 mg  200 mg Oral BID Edrick Oh, MD   200 mg at 06/07/19 0814  . levalbuterol (XOPENEX) nebulizer solution 1.25 mg  1.25 mg Nebulization Q6H PRN Amin, Ankit Chirag, MD      . levothyroxine (SYNTHROID) tablet 100 mcg  100 mcg Oral QAC breakfast Rise Patience, MD   100 mcg at 06/07/19 [phone]  . metolazone (ZAROXOLYN) tablet 5 mg  5 mg Oral Once Rosita Fire, MD      . ondansetron [facility]) tablet 4 mg  4 mg Oral Q6H PRN Rise Patience, MD       Or  . ondansetron [facility]) injection 4 mg  4 mg Intravenous Q6H PRN Rise Patience, MD      . sodium bicarbonate tablet 1,300 mg  1,300 mg Oral BID Rosita Fire, MD        Pt meds include: Statin :Yes Betablocker: yes ASA: No Other anticoagulants/antiplatelets: None  Past Medical History:  Diagnosis Date  . Acute blood loss anemia 03/14/2017  . Acute renal failure superimposed on stage 3 chronic kidney disease (  [place]) 03/14/2017  . AKI (acute kidney injury) ([place]) 02/28/2017  . Altered mental status 03/29/2017  . Anemia   . Aortic atherosclerosis ([place])   . Arthritis    "joints might ache at times; not that bad" (06/10/2018)  . Bilateral lower extremity edema 03/04/2017  . Bradycardia   . Chronic kidney disease    ?? renal insufficiency,   . CKD (chronic kidney disease) stage 3, GFR 30-59 ml/min 01/15/2017   ?? renal insufficiency, which she thinks is coming from "all these medications"  . Diabetes mellitus without complication ([place])    diagnosed 4-5 yrs ago, 06/21/18- "that was years ago"  . Disease of pancreas   . Diverticulitis    s/p perforation and partial colectomy 01/27/14 with 3 benign lymph nodes   . Diverticulosis 01/15/2017  . DJD (degenerative joint disease)   . DM (diabetes mellitus), type 2 with renal complications ([place]) 5/32/9924  . Fatty liver   . Hypertension   .  Hypothyroidism    "had radiation" (06/10/2018)  . Kidney stone   . Lethargy 02/28/2017  . Obesity, Class III, BMI 40-49.9 (morbid obesity) ([place]) 02/27/2017  . Pleural lipoma   . Postoperative wound infection 04/02/2017  . Spinal stenosis of lumbar region 01/15/2017  . Status post lumbar surgery   . Wound healing, delayed    back    Past Surgical History:  Procedure Laterality Date  . ABDOMINAL EXPOSURE N/A 02/24/2017   Procedure: ABDOMINAL EXPOSURE;  Surgeon: Angelia Mould, MD;  Location: [place];  Service: Vascular;  Laterality: N/A;  . ABDOMINAL HYSTERECTOMY  1987   no h/o abnormal paps   . ANTERIOR LAT LUMBAR FUSION N/A 02/24/2017   Procedure: Lumbar three- five Anterior lateral lumbar interbody fusion;  Surgeon: Ditty, Kevan Ny, MD;  Location: [place];  Service: Neurosurgery;  Laterality: N/A;  L3-5 Anterior lateral lumbar interbody fusion with removal of coflex at L3-4, L4-5  . ANTERIOR LUMBAR FUSION N/A 02/24/2017   Procedure: Stage 1: Lumbar five-Sacral one Anterior lumbar interbody fusion;  Surgeon: Ditty, Kevan Ny, MD;  Location: [place];  Service: Neurosurgery;  Laterality: N/A;  Stage 1: L5-S1 Anterior lumbar interbody fusion  . APPENDECTOMY    . APPLICATION OF ROBOTIC ASSISTANCE FOR SPINAL PROCEDURE N/A 02/26/2017   Procedure: APPLICATION OF ROBOTIC ASSISTANCE FOR SPINAL PROCEDURE;  Surgeon: Ditty, Kevan Ny, MD;  Location: [place];  Service: Neurosurgery;  Laterality: N/A;  . APPLICATION OF WOUND VAC N/A 03/30/2017   Procedure: APPLICATION OF WOUND VAC;  Surgeon: Ditty, Kevan Ny, MD;  Location: [place];  Service: Neurosurgery;  Laterality: N/A;  . BACK SURGERY  2013  . CATARACT EXTRACTION W/ INTRAOCULAR LENS IMPLANT Right   . CHOLECYSTECTOMY OPEN  1982  . COLON SURGERY  01/26/2014   desc.sigmoid colectomy and ventral hernia repair and splenic flexure mobilization  . COLON SURGERY    . DILATION AND CURETTAGE OF UTERUS    . HERNIA REPAIR    . LUMBAR  LAMINECTOMY WITH SPINOUS PROCESS PLATE 2 LEVEL N/A 2/68/3419   Procedure: LUMBAR LAMINECTOMY/DECOMPRESSION MICRODISCECTOMY CoFlex;  Surgeon: Faythe Ghee, MD;  Location: [facility];  Service: Neurosurgery;  Laterality: N/A;  Lumbar three-four,Lumbar Four-Five Laminectomy with Coflex  . LUMBAR WOUND DEBRIDEMENT N/A 03/30/2017   Procedure: Lumbar wound exploration/debridement, placement of wound vac;  Surgeon: Ditty, Kevan Ny, MD;  Location: [place];  Service: Neurosurgery;  Laterality: N/A;  Lumbar wound exploration/debridement, placement of wound vac  . LUMBAR WOUND DEBRIDEMENT N/A 06/06/2018  Procedure: LUMBAR WOUND DEBRIDEMENT/EXPLORATION;  Surgeon: Consuella Lose, MD;  Location: [place];  Service: Neurosurgery;  Laterality: N/A;  . LUMBAR WOUND DEBRIDEMENT N/A 06/22/2018   Procedure: SIMPLE INCISION AND DRAINAGE OF WOUND, APPLICATION OF WOUND VAC;  Surgeon: Consuella Lose, MD;  Location: [place];  Service: Neurosurgery;  Laterality: N/A;  . PARTIAL COLECTOMY     01/27/14 diverticulitis and 3 benign lymph nodes [facility] Dr. Pat Patrick   . REDUCTION MAMMAPLASTY Bilateral 1992  . TUBAL LIGATION    . VENTRAL HERNIA REPAIR  2014    Social History Social History   Tobacco Use  . Smoking status: Never Smoker  . Smokeless tobacco: Never Used  Substance Use Topics  . Alcohol use: Not Currently    Frequency: Never    Comment: "nothing since age 29" (06/10/2018)  . Drug use: Never    Family History Family History  Problem Relation Age of Onset  . Diabetes Father   . Heart attack Father   . Hypertension Father   . Heart disease Father   . Kidney disease Father   . Diabetes Mother   . Hypertension Mother   . Heart disease Mother   . Kidney disease Mother   . Lupus Sister   . Heart disease Brother   . Diabetes Brother   . Heart disease Brother   . Diabetes Brother   . Lupus Sister     Allergies  Allergen Reactions  . Clams [Shellfish Allergy] Swelling and Other (See Comments)     THROAT SWELLS NECK TURNS RED  . Hydralazine Other (See Comments)    CHEST TIGHTNESS Patient has tolerated multiple doses of hydralazine since allergy was listed   . Norvasc [Amlodipine Besylate] Swelling    Leg edema   . Milk-Related Compounds Diarrhea     REVIEW OF SYSTEMS  General: [ ]  Weight loss, [ ]  Fever, [ ]  chills Neurologic: [ ]  Dizziness, [ ]  Blackouts, [ ]  Seizure [ ]  Stroke, [ ]  "Mini stroke", [ ]  Slurred speech, [ ]  Temporary blindness; [ ]  weakness in arms or legs, [ ]  Hoarseness [ ]  Dysphagia Cardiac: [ ]  Chest pain/pressure, [x ] Shortness of breath at rest [ ]  Shortness of breath with exertion, [ ]  Atrial fibrillation or irregular heartbeat  Vascular: [ ]  Pain in legs with walking, [ ]  Pain in legs at rest, [ ]  Pain in legs at night,  [ ]  Non-healing ulcer, [ ]  Blood clot in vein/DVT,   Pulmonary: [ ]  Home oxygen, [ ]  Productive cough, [ ]  Coughing up blood, [ ]  Asthma,  [ ]  Wheezing [ ]  COPD Musculoskeletal:  [ ]  Arthritis, [ ]  Low back pain, [ ]  Joint pain Hematologic: [ ]  Easy Bruising, [ ]  Anemia; [ ]  Hepatitis Gastrointestinal: [ ]  Blood in stool, [ ]  Gastroesophageal Reflux/heartburn, Urinary: [x ] chronic Kidney disease, [ ]  on HD - [ ]  MWF or [ ]  TTHS, [ ]  Burning with urination, [ ]  Difficulty urinating Skin: [ ]  Rashes, [ ]  Wounds Psychological: [ ]  Anxiety, [ ]  Depression  Physical Examination Vitals:   06/06/19 1226 06/06/19 2053 06/07/19 0605 06/07/19 1214  BP: (!) 157/58 (!) 169/51 (!) 145/53 (!) 148/59  Pulse: 60 65 66 60  Resp: 18 18 16 20   Temp: 98.3 F (36.8 C) 98.9 F (37.2 C) 98.9 F (37.2 C) 98.3 F (36.8 C)  TempSrc: Oral Oral Oral Oral  SpO2: 99% 94% 97% 100%  Weight:   94.3 kg   Height:  Body mass index is 36.85 kg/m.  General:  WDWN in NAD HENT: WNL Eyes: Pupils equal Pulmonary: normal non-labored breathing , without Rales, rhonchi,  wheezing Cardiac: RRR, without  Murmurs, rubs or gallops; No carotid bruits Abdomen:  soft, NT, no masses Skin: no rashes, ulcers noted;  no Gangrene , no cellulitis; no open wounds;   Vascular Exam/Pulses:Palpable radial pulse B, grip 5/5 B UE, and sensation intact.   Musculoskeletal: no muscle wasting or atrophy; no edema  Neurologic: A&O X 3; Appropriate Affect ;  SENSATION: normal; MOTOR FUNCTION: 5/5 Symmetric Speech is fluent/normal   Significant Diagnostic Studies: CBC Lab Results  Component Value Date   WBC 18.1 (H) 06/07/2019   HGB 7.7 (L) 06/07/2019   HCT 25.4 (L) 06/07/2019   MCV 84.9 06/07/2019   PLT 385 06/07/2019    BMET    Component Value Date/Time   NA 133 (L) 06/07/2019 0345   NA 140 04/10/2017   NA 140 04/22/2014 0210   K 4.5 06/07/2019 0345   K 4.0 04/22/2014 0210   CL 104 06/07/2019 0345   CL 108 (H) 04/22/2014 0210   CO2 17 (L) 06/07/2019 0345   CO2 24 04/22/2014 0210   GLUCOSE 96 06/07/2019 0345   GLUCOSE 78 04/22/2014 0210   BUN 88 (H) 06/07/2019 0345   BUN 27 (A) 04/10/2017   BUN 27 (H) 04/22/2014 0210   CREATININE 6.01 (H) 06/07/2019 0345   CREATININE 2.64 (H) 01/12/2019 1352   CALCIUM 8.1 (L) 06/07/2019 0345   CALCIUM 8.5 04/22/2014 0210   GFRNONAA 6 (L) 06/07/2019 0345   GFRNONAA 51 (L) 04/22/2014 0210   GFRAA 7 (L) 06/07/2019 0345   GFRAA 59 (L) 04/22/2014 0210   Estimated Creatinine Clearance: 8.7 mL/min (A) (by C-G formula based on SCr of 6.01 mg/dL (H)).  COAG Lab Results  Component Value Date   INR 1.2 10/07/2018   INR 1.45 06/05/2018   INR 1.38 06/04/2018     Non-Invasive Vascular Imaging:  +-----------------+-------------+----------+---------+ Right Cephalic   Diameter (cm)Depth (cm)Findings  +-----------------+-------------+----------+---------+ Prox upper arm       0.28        0.87             +-----------------+-------------+----------+---------+ Mid upper arm        0.27        0.43             +-----------------+-------------+----------+---------+ Dist upper arm       0.28         0.46   branching +-----------------+-------------+----------+---------+ Antecubital fossa    0.47        0.75   branching +-----------------+-------------+----------+---------+ Prox forearm         0.42        0.48             +-----------------+-------------+----------+---------+ Mid forearm          0.31        0.61   branching +-----------------+-------------+----------+---------+ Wrist                0.14        0.49             +-----------------+-------------+----------+---------+  +-----------------+-------------+----------+---------+ Left Cephalic    Diameter (cm)Depth (cm)Findings  +-----------------+-------------+----------+---------+ Prox upper arm       0.43        1.46             +-----------------+-------------+----------+---------+ Mid upper arm  0.40        1.00             +-----------------+-------------+----------+---------+ Dist upper arm       0.42        0.74             +-----------------+-------------+----------+---------+ Antecubital fossa    0.51        0.27             +-----------------+-------------+----------+---------+ Prox forearm         0.35        0.36   branching +-----------------+-------------+----------+---------+ Mid forearm          0.38        0.39   branching +-----------------+-------------+----------+---------+ Wrist                0.31        0.34             +-----------------+-------------+----------+---------+    ASSESSMENT/PLAN:  AKI on CKD in CHF with Cr > 6 She is right hand dominant.  We will plan left BC fistula and TDC placement.  Based on the vein mapping she has an acceptable left Cephalic vein.  I have asked th RN to have the peripheral IV moved from the left [facility].    NPO past MN   Roxy Horseman 06/07/2019 2:36 PM   I agree with the above.  Have seen and evaluated the patient.  This is a 78 year old right-handed female with acute on chronic  renal insufficiency secondary to diabetes and hypertension who we have been asked to place permanent access.  I have reviewed her vein mapping.  She appears to be a candidate for a left cephalic vein fistula.  She has a palpable left radial and brachial pulse.  There is currently an IV in her left arm which we have asked to be replaced.  I discussed the details of the procedure including the risk of steal, nonmaturity, and the need for future interventions.  All of her questions were answered.  She may require a tunneled dialysis catheter.  Nephrology will let us know whether or not to place this at the time of her fistula, based on her morning labs.  She will be n.p.o. after midnight.  Annamarie Major

## 2019-06-07 NOTE — H&P (View-Only) (Signed)
[facility] NOTE   MRN : [id number]  Reason for Consult: AKI on CKD Referring Physician: Dr. Carolin Sicks  History of Present Illness: 78 y/o female with AKI on CKD now in need of HD we have been asked to provide [facility] and permanent access.   She denise weakness loss of sensation or pain in B UE.  She denise chest implants.    Past medical history includes:DM, HTN, CHF, anemia, HLD with CHF exacerbation.       Current Facility-Administered Medications  Medication Dose Route Frequency Provider Last Rate Last Dose  . acetaminophen (TYLENOL) tablet 650 mg  650 mg Oral Q6H PRN Rise Patience, MD   650 mg at 06/06/19 1309   Or  . acetaminophen (TYLENOL) suppository 650 mg  650 mg Rectal Q6H PRN Rise Patience, MD      . amLODipine (NORVASC) tablet 10 mg  10 mg Oral Daily Rise Patience, MD   10 mg at 06/07/19 0814  . atorvastatin (LIPITOR) tablet 40 mg  40 mg Oral q1800 Rise Patience, MD   40 mg at 06/06/19 1724  . Darbepoetin Alfa (ARANESP) injection 150 mcg  150 mcg Subcutaneous Q Sat-1800 Edrick Oh, MD   150 mcg at 06/04/19 1735  . ferrous sulfate tablet 325 mg  325 mg Oral Q breakfast Rise Patience, MD   325 mg at 06/07/19 0814  . furosemide (LASIX) injection 80 mg  80 mg Intravenous Q12H Rosita Fire, MD   80 mg at 06/07/19 0814  . gabapentin (NEURONTIN) capsule 300 mg  300 mg Oral QHS Rise Patience, MD   300 mg at 06/06/19 2138  . heparin injection 5,000 Units  5,000 Units Subcutaneous Q8H Rise Patience, MD   5,000 Units at 06/07/19 [phone]  . hydrALAZINE (APRESOLINE) injection 10 mg  10 mg Intravenous Q4H PRN Rise Patience, MD   10 mg at 05/31/19 0651  . hydrALAZINE (APRESOLINE) tablet 50 mg  50 mg Oral Q8H Rise Patience, MD   50 mg at 06/07/19 [phone]  . insulin aspart (novoLOG) injection 0-9 Units  0-9 Units Subcutaneous TID WC Dessa Phi, DO   2 Units at 06/02/19 1221  . ipratropium  (ATROVENT) nebulizer solution 0.5 mg  0.5 mg Nebulization Q6H PRN Amin, Ankit Chirag, MD      . labetalol (NORMODYNE) tablet 200 mg  200 mg Oral BID Edrick Oh, MD   200 mg at 06/07/19 0814  . levalbuterol (XOPENEX) nebulizer solution 1.25 mg  1.25 mg Nebulization Q6H PRN Amin, Ankit Chirag, MD      . levothyroxine (SYNTHROID) tablet 100 mcg  100 mcg Oral QAC breakfast Rise Patience, MD   100 mcg at 06/07/19 [phone]  . metolazone (ZAROXOLYN) tablet 5 mg  5 mg Oral Once Rosita Fire, MD      . ondansetron [facility]) tablet 4 mg  4 mg Oral Q6H PRN Rise Patience, MD       Or  . ondansetron [facility]) injection 4 mg  4 mg Intravenous Q6H PRN Rise Patience, MD      . sodium bicarbonate tablet 1,300 mg  1,300 mg Oral BID Rosita Fire, MD        Pt meds include: Statin :Yes Betablocker: yes ASA: No Other anticoagulants/antiplatelets: None  Past Medical History:  Diagnosis Date  . Acute blood loss anemia 03/14/2017  . Acute renal failure superimposed on stage 3 chronic kidney disease (  [place]) 03/14/2017  . AKI (acute kidney injury) ([place]) 02/28/2017  . Altered mental status 03/29/2017  . Anemia   . Aortic atherosclerosis ([place])   . Arthritis    "joints might ache at times; not that bad" (06/10/2018)  . Bilateral lower extremity edema 03/04/2017  . Bradycardia   . Chronic kidney disease    ?? renal insufficiency,   . CKD (chronic kidney disease) stage 3, GFR 30-59 ml/min 01/15/2017   ?? renal insufficiency, which she thinks is coming from "all these medications"  . Diabetes mellitus without complication ([place])    diagnosed 4-5 yrs ago, 06/21/18- "that was years ago"  . Disease of pancreas   . Diverticulitis    s/p perforation and partial colectomy 01/27/14 with 3 benign lymph nodes   . Diverticulosis 01/15/2017  . DJD (degenerative joint disease)   . DM (diabetes mellitus), type 2 with renal complications ([place]) 9/73/5329  . Fatty liver   . Hypertension   .  Hypothyroidism    "had radiation" (06/10/2018)  . Kidney stone   . Lethargy 02/28/2017  . Obesity, Class III, BMI 40-49.9 (morbid obesity) ([place]) 02/27/2017  . Pleural lipoma   . Postoperative wound infection 04/02/2017  . Spinal stenosis of lumbar region 01/15/2017  . Status post lumbar surgery   . Wound healing, delayed    back    Past Surgical History:  Procedure Laterality Date  . ABDOMINAL EXPOSURE N/A 02/24/2017   Procedure: ABDOMINAL EXPOSURE;  Surgeon: Angelia Mould, MD;  Location: [place];  Service: Vascular;  Laterality: N/A;  . ABDOMINAL HYSTERECTOMY  1987   no h/o abnormal paps   . ANTERIOR LAT LUMBAR FUSION N/A 02/24/2017   Procedure: Lumbar three- five Anterior lateral lumbar interbody fusion;  Surgeon: Ditty, Kevan Ny, MD;  Location: [place];  Service: Neurosurgery;  Laterality: N/A;  L3-5 Anterior lateral lumbar interbody fusion with removal of coflex at L3-4, L4-5  . ANTERIOR LUMBAR FUSION N/A 02/24/2017   Procedure: Stage 1: Lumbar five-Sacral one Anterior lumbar interbody fusion;  Surgeon: Ditty, Kevan Ny, MD;  Location: [place];  Service: Neurosurgery;  Laterality: N/A;  Stage 1: L5-S1 Anterior lumbar interbody fusion  . APPENDECTOMY    . APPLICATION OF ROBOTIC ASSISTANCE FOR SPINAL PROCEDURE N/A 02/26/2017   Procedure: APPLICATION OF ROBOTIC ASSISTANCE FOR SPINAL PROCEDURE;  Surgeon: Ditty, Kevan Ny, MD;  Location: [place];  Service: Neurosurgery;  Laterality: N/A;  . APPLICATION OF WOUND VAC N/A 03/30/2017   Procedure: APPLICATION OF WOUND VAC;  Surgeon: Ditty, Kevan Ny, MD;  Location: [place];  Service: Neurosurgery;  Laterality: N/A;  . BACK SURGERY  2013  . CATARACT EXTRACTION W/ INTRAOCULAR LENS IMPLANT Right   . CHOLECYSTECTOMY OPEN  1982  . COLON SURGERY  01/26/2014   desc.sigmoid colectomy and ventral hernia repair and splenic flexure mobilization  . COLON SURGERY    . DILATION AND CURETTAGE OF UTERUS    . HERNIA REPAIR    . LUMBAR  LAMINECTOMY WITH SPINOUS PROCESS PLATE 2 LEVEL N/A 04/27/2682   Procedure: LUMBAR LAMINECTOMY/DECOMPRESSION MICRODISCECTOMY CoFlex;  Surgeon: Faythe Ghee, MD;  [facility]: [facility];  Service: Neurosurgery;  Laterality: N/A;  Lumbar three-four,Lumbar Four-Five Laminectomy with Coflex  . LUMBAR WOUND DEBRIDEMENT N/A 03/30/2017   Procedure: Lumbar wound exploration/debridement, placement of wound vac;  Surgeon: Ditty, Kevan Ny, MD;  Location: [place];  Service: Neurosurgery;  Laterality: N/A;  Lumbar wound exploration/debridement, placement of wound vac  . LUMBAR WOUND DEBRIDEMENT N/A 06/06/2018  Procedure: LUMBAR WOUND DEBRIDEMENT/EXPLORATION;  Surgeon: Consuella Lose, MD;  Location: [place];  Service: Neurosurgery;  Laterality: N/A;  . LUMBAR WOUND DEBRIDEMENT N/A 06/22/2018   Procedure: SIMPLE INCISION AND DRAINAGE OF WOUND, APPLICATION OF WOUND VAC;  Surgeon: Consuella Lose, MD;  Location: [place];  Service: Neurosurgery;  Laterality: N/A;  . PARTIAL COLECTOMY     01/27/14 diverticulitis and 3 benign lymph nodes [facility] Dr. Pat Patrick   . REDUCTION MAMMAPLASTY Bilateral 1992  . TUBAL LIGATION    . VENTRAL HERNIA REPAIR  2014    Social History Social History   Tobacco Use  . Smoking status: Never Smoker  . Smokeless tobacco: Never Used  Substance Use Topics  . Alcohol use: Not Currently    Frequency: Never    Comment: "nothing since age 84" (06/10/2018)  . Drug use: Never    Family History Family History  Problem Relation Age of Onset  . Diabetes Father   . Heart attack Father   . Hypertension Father   . Heart disease Father   . Kidney disease Father   . Diabetes Mother   . Hypertension Mother   . Heart disease Mother   . Kidney disease Mother   . Lupus Sister   . Heart disease Brother   . Diabetes Brother   . Heart disease Brother   . Diabetes Brother   . Lupus Sister     Allergies  Allergen Reactions  . Clams [Shellfish Allergy] Swelling and Other (See Comments)     THROAT SWELLS NECK TURNS RED  . Hydralazine Other (See Comments)    CHEST TIGHTNESS Patient has tolerated multiple doses of hydralazine since allergy was listed   . Norvasc [Amlodipine Besylate] Swelling    Leg edema   . Milk-Related Compounds Diarrhea     REVIEW OF SYSTEMS  General: [ ]  Weight loss, [ ]  Fever, [ ]  chills Neurologic: [ ]  Dizziness, [ ]  Blackouts, [ ]  Seizure [ ]  Stroke, [ ]  "Mini stroke", [ ]  Slurred speech, [ ]  Temporary blindness; [ ]  weakness in arms or legs, [ ]  Hoarseness [ ]  Dysphagia Cardiac: [ ]  Chest pain/pressure, [x ] Shortness of breath at rest [ ]  Shortness of breath with exertion, [ ]  Atrial fibrillation or irregular heartbeat  Vascular: [ ]  Pain in legs with walking, [ ]  Pain in legs at rest, [ ]  Pain in legs at night,  [ ]  Non-healing ulcer, [ ]  Blood clot in vein/DVT,   Pulmonary: [ ]  Home oxygen, [ ]  Productive cough, [ ]  Coughing up blood, [ ]  Asthma,  [ ]  Wheezing [ ]  COPD Musculoskeletal:  [ ]  Arthritis, [ ]  Low back pain, [ ]  Joint pain Hematologic: [ ]  Easy Bruising, [ ]  Anemia; [ ]  Hepatitis Gastrointestinal: [ ]  Blood in stool, [ ]  Gastroesophageal Reflux/heartburn, Urinary: [x ] chronic Kidney disease, [ ]  on HD - [ ]  MWF or [ ]  TTHS, [ ]  Burning with urination, [ ]  Difficulty urinating Skin: [ ]  Rashes, [ ]  Wounds Psychological: [ ]  Anxiety, [ ]  Depression  Physical Examination Vitals:   06/06/19 1226 06/06/19 2053 06/07/19 0605 06/07/19 1214  BP: (!) 157/58 (!) 169/51 (!) 145/53 (!) 148/59  Pulse: 60 65 66 60  Resp: 18 18 16 20   Temp: 98.3 F (36.8 C) 98.9 F (37.2 C) 98.9 F (37.2 C) 98.3 F (36.8 C)  TempSrc: Oral Oral Oral Oral  SpO2: 99% 94% 97% 100%  Weight:   94.3 kg   Height:  Body mass index is 36.85 kg/m.  General:  WDWN in NAD HENT: WNL Eyes: Pupils equal Pulmonary: normal non-labored breathing , without Rales, rhonchi,  wheezing Cardiac: RRR, without  Murmurs, rubs or gallops; No carotid bruits Abdomen:  soft, NT, no masses Skin: no rashes, ulcers noted;  no Gangrene , no cellulitis; no open wounds;   Vascular Exam/Pulses:Palpable radial pulse B, grip 5/5 B UE, and sensation intact.   Musculoskeletal: no muscle wasting or atrophy; no edema  Neurologic: A&O X 3; Appropriate Affect ;  SENSATION: normal; MOTOR FUNCTION: 5/5 Symmetric Speech is fluent/normal   Significant Diagnostic Studies: CBC Lab Results  Component Value Date   WBC 18.1 (H) 06/07/2019   HGB 7.7 (L) 06/07/2019   HCT 25.4 (L) 06/07/2019   MCV 84.9 06/07/2019   PLT 385 06/07/2019    BMET    Component Value Date/Time   NA 133 (L) 06/07/2019 0345   NA 140 04/10/2017   NA 140 04/22/2014 0210   K 4.5 06/07/2019 0345   K 4.0 04/22/2014 0210   CL 104 06/07/2019 0345   CL 108 (H) 04/22/2014 0210   CO2 17 (L) 06/07/2019 0345   CO2 24 04/22/2014 0210   GLUCOSE 96 06/07/2019 0345   GLUCOSE 78 04/22/2014 0210   BUN 88 (H) 06/07/2019 0345   BUN 27 (A) 04/10/2017   BUN 27 (H) 04/22/2014 0210   CREATININE 6.01 (H) 06/07/2019 0345   CREATININE 2.64 (H) 01/12/2019 1352   CALCIUM 8.1 (L) 06/07/2019 0345   CALCIUM 8.5 04/22/2014 0210   GFRNONAA 6 (L) 06/07/2019 0345   GFRNONAA 51 (L) 04/22/2014 0210   GFRAA 7 (L) 06/07/2019 0345   GFRAA 59 (L) 04/22/2014 0210   Estimated Creatinine Clearance: 8.7 mL/min (A) (by C-G formula based on SCr of 6.01 mg/dL (H)).  COAG Lab Results  Component Value Date   INR 1.2 10/07/2018   INR 1.45 06/05/2018   INR 1.38 06/04/2018     Non-Invasive Vascular Imaging:  +-----------------+-------------+----------+---------+ Right Cephalic   Diameter (cm)Depth (cm)Findings  +-----------------+-------------+----------+---------+ Prox upper arm       0.28        0.87             +-----------------+-------------+----------+---------+ Mid upper arm        0.27        0.43             +-----------------+-------------+----------+---------+ Dist upper arm       0.28         0.46   branching +-----------------+-------------+----------+---------+ Antecubital fossa    0.47        0.75   branching +-----------------+-------------+----------+---------+ Prox forearm         0.42        0.48             +-----------------+-------------+----------+---------+ Mid forearm          0.31        0.61   branching +-----------------+-------------+----------+---------+ Wrist                0.14        0.49             +-----------------+-------------+----------+---------+  +-----------------+-------------+----------+---------+ Left Cephalic    Diameter (cm)Depth (cm)Findings  +-----------------+-------------+----------+---------+ Prox upper arm       0.43        1.46             +-----------------+-------------+----------+---------+ Mid upper arm  0.40        1.00             +-----------------+-------------+----------+---------+ Dist upper arm       0.42        0.74             +-----------------+-------------+----------+---------+ Antecubital fossa    0.51        0.27             +-----------------+-------------+----------+---------+ Prox forearm         0.35        0.36   branching +-----------------+-------------+----------+---------+ Mid forearm          0.38        0.39   branching +-----------------+-------------+----------+---------+ Wrist                0.31        0.34             +-----------------+-------------+----------+---------+    ASSESSMENT/PLAN:  AKI on CKD in CHF with Cr > 6 She is right hand dominant.  We will plan left BC fistula and TDC placement.  Based on the vein mapping she has an acceptable left Cephalic vein.  I have asked th RN to have the peripheral IV moved from the left [facility].    NPO past MN   Roxy Horseman 06/07/2019 2:36 PM   I agree with the above.  Have seen and evaluated the patient.  This is a 78 year old right-handed female with acute on chronic  renal insufficiency secondary to diabetes and hypertension who we have been asked to place permanent access.  I have reviewed her vein mapping.  She appears to be a candidate for a left cephalic vein fistula.  She has a palpable left radial and brachial pulse.  There is currently an IV in her left arm which we have asked to be replaced.  I discussed the details of the procedure including the risk of steal, nonmaturity, and the need for future interventions.  All of her questions were answered.  She may require a tunneled dialysis catheter.  Nephrology will let us know whether or not to place this at the time of her fistula, based on her morning labs.  She will be n.p.o. after midnight.  Annamarie Major

## 2019-06-07 NOTE — Progress Notes (Signed)
PROGRESS NOTE    [facility]  SKA:[id number] DOB: 19-Dec-1940 DOA: 05/30/2019 PCP: McLean-Scocuzza, Nino Glow, MD      Brief Narrative:  Taylor Tyler is a 78 y.o. F with DM, HTN, CKD IV baseline Cr 2.5, anemia, hypothyroidism, and nonhealing lower back wound after surgery who presented with fall and leg pain.  In the ER, Cr >5.  She was noted to have lower extremity edema, which she said has been accumulating for several weeks as well as progressive shortness of breath.   She was admitted for AoCRF, fluid overload.   Assessment & Plan:  Acute on chronic renal failure Baseline creatinine 2.2-2.5.  Urinalysis bland.  Renal ultrasound unremarkable.  Nephrology consulted.    Urine output per day: 700 cc > 350 cc > 2350 cc  >700 cc > 2300 cc > 1100cc yesterday Creatinine trend with diuresis: 5.74 > 6.13 > 5.79 > 5.68 > 5.46 > 5.59 > 5.66 > 5.91 > 5.86>6.01 BUN up to 88, bicarb 17, K normal  -Consult Nephrology, appreciate expert cares -Strict I/Os, daily BMP --Vein mapping study completed but no urgent need for dialysis  Acute on chronic diastolic CHF Maybe cardiorenal syndrome, although may be renal failure driving fluid overload primarily.   Urine output better yesterday, net -1.4L yesterday.  Cr stable, potassium stable. Still mild edema -Echocardiogram with grade 2 diastolic dysfunction but normal ejection fraction -Continue IV Furosemide decreased to twice daily scheduled per nephrology -Strict I/Os, daily weights, telemetry  -Daily monitoring renal function   Hypothyroidism TSH slightly high, free T4 normal -Continue levothyroxine  Diabetes GLucose well controlled -Continue sliding scale correction insulin -Continue atorvastatin, gabapentin  Anemia of chronic kidney disease Hemoglobin stable relative to baseline -Continue iron  Hypertension Hypertensive urgency BP slightly high -Continue Lasix, amlodipine, hydralazine, labetalol -Stop clonidine given bradycardia  worsened     MDM and disposition: The below labs and imaging reports reviewed and summarized above.  Medication management as above. This is a severe exacerbation of chronic disease, not yet improving.  The patient was admitted with acute on chronic renal failure.  Nephrology are following.  At present no indications for dialysis.     DVT prophylaxis: Heparin Code Status: Full code Family Communication:     Consultants:   Nephrology  Procedures:   11/2 echo  Antimicrobials:       Subjective: Endorses improvement in swelling and denies shortness of breath orthopnea or cough  Objective: Vitals:   06/06/19 0914 06/06/19 1226 06/06/19 2053 06/07/19 0605  BP: 138/65 (!) 157/58 (!) 169/51 (!) 145/53  Pulse: (!) 59 60 65 66  Resp:  18 18 16   Temp:  98.3 F (36.8 C) 98.9 F (37.2 C) 98.9 F (37.2 C)  TempSrc:  Oral Oral Oral  SpO2:  99% 94% 97%  Weight:    94.3 kg  Height:        Intake/Output Summary (Last 24 hours) at 06/07/2019 1103 Last data filed at 06/07/2019 1007 Gross per 24 hour  Intake 920 ml  Output 800 ml  Net 120 ml   Filed Weights   06/05/19 0309 06/06/19 0457 06/07/19 0605  Weight: 99.2 kg 100 kg 94.3 kg    Examination: General appearance: well nourished adult female, in no acute distress.   HEENT: Anicteric, conjunctiva pink, lids and lashes normal. No nasal deformity, discharge, epistaxis.  Lips moist, dentures in place. OP normal, no oral lesions.   Skin: Warm and dry.  No suspicious rashes or lesions. Cardiac: RRR,  no murmurs appreciated.  1+ LE edema.    Respiratory: Normal respiratory rate and rhythm. CTAB without rales or wheezes. Abdomen: Abdomen soft.  No ttp or guarding. No ascites, distension, hepatosplenomegaly.   MSK: No deformities or effusions of the large joints of the upper or lower extremities bilaterally. Neuro: Awake and alert. Moves all extremities equally and with normal coordination.  Marland Kitchen Speech fluent.    Psych: Sensorium  intact and responding to questions, attention normal. Affect normal.  Judgment and insight appear     LOS: 8 days    Time spent: 35 minutes   Charolotte Capuchin, MD [facility] 06/07/2019, 11:03 AM     Please page through [place]:  [email or web address] Password [email or web address] If 7PM-7AM, please contact night-coverage

## 2019-06-08 ENCOUNTER — Inpatient Hospital Stay (HOSPITAL_COMMUNITY): Payer: Medicare Other

## 2019-06-08 ENCOUNTER — Inpatient Hospital Stay (HOSPITAL_COMMUNITY): Payer: Medicare Other | Admitting: Certified Registered"

## 2019-06-08 ENCOUNTER — Encounter (HOSPITAL_COMMUNITY)
Admission: EM | Disposition: A | Discharge status: Skilled Nursing Facility | Payer: Self-pay | Source: Home / Self Care | Attending: Internal Medicine

## 2019-06-08 ENCOUNTER — Encounter (HOSPITAL_COMMUNITY): Payer: Self-pay | Admitting: *Deleted

## 2019-06-08 DIAGNOSIS — N178 Other acute kidney failure: Secondary | ICD-10-CM

## 2019-06-08 DIAGNOSIS — N179 Acute kidney failure, unspecified: Secondary | ICD-10-CM

## 2019-06-08 DIAGNOSIS — L8990 Pressure ulcer of unspecified site, unspecified stage: Secondary | ICD-10-CM | POA: Insufficient documentation

## 2019-06-08 HISTORY — PX: AV FISTULA PLACEMENT: SHX1204

## 2019-06-08 HISTORY — PX: THROMBECTOMY BRACHIAL ARTERY: SHX6649

## 2019-06-08 HISTORY — PX: INSERTION OF DIALYSIS CATHETER: SHX1324

## 2019-06-08 LAB — CBC
HCT: 24.2 % — ABNORMAL LOW (ref 36.0–46.0)
HCT: 26.3 % — ABNORMAL LOW (ref 36.0–46.0)
Hemoglobin: 7.5 g/dL — ABNORMAL LOW (ref 12.0–15.0)
Hemoglobin: 8.1 g/dL — ABNORMAL LOW (ref 12.0–15.0)
MCH: 26.1 pg (ref 26.0–34.0)
MCH: 26 pg (ref 26.0–34.0)
MCHC: 31 g/dL (ref 30.0–36.0)
MCHC: 30.8 g/dL (ref 30.0–36.0)
MCV: 84.3 fL (ref 80.0–100.0)
MCV: 84.6 fL (ref 80.0–100.0)
Platelets: 389 10*3/uL (ref 150–400)
Platelets: 370 10*3/uL (ref 150–400)
RBC: 2.87 MIL/uL — ABNORMAL LOW (ref 3.87–5.11)
RBC: 3.11 MIL/uL — ABNORMAL LOW (ref 3.87–5.11)
RDW: 16.1 % — ABNORMAL HIGH (ref 11.5–15.5)
RDW: 16.4 % — ABNORMAL HIGH (ref 11.5–15.5)
WBC: 19.3 10*3/uL — ABNORMAL HIGH (ref 4.0–10.5)
WBC: 18.6 10*3/uL — ABNORMAL HIGH (ref 4.0–10.5)
nRBC: 0.1 % (ref 0.0–0.2)
nRBC: 0.1 % (ref 0.0–0.2)

## 2019-06-08 LAB — PROTIME-INR
INR: 1.3 — ABNORMAL HIGH (ref 0.8–1.2)
Prothrombin Time: 16.2 seconds — ABNORMAL HIGH (ref 11.4–15.2)

## 2019-06-08 LAB — RENAL FUNCTION PANEL
Albumin: 1.6 g/dL — ABNORMAL LOW (ref 3.5–5.0)
Albumin: 1.7 g/dL — ABNORMAL LOW (ref 3.5–5.0)
Anion gap: 14 (ref 5–15)
Anion gap: 15 (ref 5–15)
BUN: 93 mg/dL — ABNORMAL HIGH (ref 8–23)
BUN: 97 mg/dL — ABNORMAL HIGH (ref 8–23)
CO2: 18 mmol/L — ABNORMAL LOW (ref 22–32)
CO2: 17 mmol/L — ABNORMAL LOW (ref 22–32)
Calcium: 8.4 mg/dL — ABNORMAL LOW (ref 8.9–10.3)
Calcium: 8.5 mg/dL — ABNORMAL LOW (ref 8.9–10.3)
Chloride: 104 mmol/L (ref 98–111)
Chloride: 104 mmol/L (ref 98–111)
Creatinine, Ser: 6.21 mg/dL — ABNORMAL HIGH (ref 0.44–1.00)
Creatinine, Ser: 6.25 mg/dL — ABNORMAL HIGH (ref 0.44–1.00)
GFR calc Af Amer: 7 mL/min — ABNORMAL LOW (ref >60)
GFR calc Af Amer: 7 mL/min — ABNORMAL LOW (ref >60)
GFR calc non Af Amer: 6 mL/min — ABNORMAL LOW (ref >60)
GFR calc non Af Amer: 6 mL/min — ABNORMAL LOW (ref >60)
Glucose, Bld: 89 mg/dL (ref 70–99)
Glucose, Bld: 154 mg/dL — ABNORMAL HIGH (ref 70–99)
Phosphorus: 6.3 mg/dL — ABNORMAL HIGH (ref 2.5–4.6)
Phosphorus: 7.3 mg/dL — ABNORMAL HIGH (ref 2.5–4.6)
Potassium: 4.4 mmol/L (ref 3.5–5.1)
Potassium: 4.2 mmol/L (ref 3.5–5.1)
Sodium: 136 mmol/L (ref 135–145)
Sodium: 136 mmol/L (ref 135–145)

## 2019-06-08 LAB — GLUCOSE, CAPILLARY
Glucose-Capillary: 96 mg/dL (ref 70–99)
Glucose-Capillary: 100 mg/dL — ABNORMAL HIGH (ref 70–99)
Glucose-Capillary: 90 mg/dL (ref 70–99)
Glucose-Capillary: 89 mg/dL (ref 70–99)
Glucose-Capillary: 99 mg/dL (ref 70–99)

## 2019-06-08 LAB — SARS CORONAVIRUS 2 (TAT 6-24 HRS): SARS Coronavirus 2: NEGATIVE

## 2019-06-08 LAB — SURGICAL PCR SCREEN
MRSA, PCR: NEGATIVE
Staphylococcus aureus: NEGATIVE

## 2019-06-08 LAB — MAGNESIUM: Magnesium: 1.6 mg/dL — ABNORMAL LOW (ref 1.7–2.4)

## 2019-06-08 SURGERY — INSERTION OF DIALYSIS CATHETER
Anesthesia: Choice

## 2019-06-08 SURGERY — INSERTION OF DIALYSIS CATHETER
Anesthesia: General | Site: Neck | Laterality: Right

## 2019-06-08 MED ORDER — FENTANYL CITRATE (PF) 100 MCG/2ML IJ SOLN
INTRAMUSCULAR | Status: DC | PRN
  Administered 2019-06-08: 50 ug via INTRAVENOUS

## 2019-06-08 MED ORDER — LIDOCAINE-PRILOCAINE 2.5-2.5 % EX CREA: 1.0000 "application " | TOPICAL_CREAM | CUTANEOUS | Status: DC | PRN

## 2019-06-08 MED ORDER — HEPARIN SODIUM (PORCINE) 1000 UNIT/ML IJ SOLN
INTRAMUSCULAR | Status: AC
  Filled 2019-06-08: qty 4

## 2019-06-08 MED ORDER — SODIUM CHLORIDE 0.9 % IV SOLN: 100.0000 mL | INTRAVENOUS | Status: DC | PRN

## 2019-06-08 MED ORDER — MIDAZOLAM HCL 5 MG/5ML IJ SOLN
INTRAMUSCULAR | Status: DC | PRN
  Administered 2019-06-08: 1 mg via INTRAVENOUS

## 2019-06-08 MED ORDER — HYDROCODONE-ACETAMINOPHEN 5-325 MG PO TABS
1.0000 | ORAL_TABLET | ORAL | Status: DC | PRN
  Administered 2019-06-08 – 2019-07-01 (×4): 1 via ORAL
  Filled 2019-06-08 (×5): qty 1

## 2019-06-08 MED ORDER — HEPARIN SODIUM (PORCINE) 1000 UNIT/ML IJ SOLN
INTRAMUSCULAR | Status: DC | PRN
  Administered 2019-06-08: 3400 [IU]

## 2019-06-08 MED ORDER — LIDOCAINE-EPINEPHRINE 0.5 %-1:200000 IJ SOLN
INTRAMUSCULAR | Status: DC | PRN
  Administered 2019-06-08: 50 mL

## 2019-06-08 MED ORDER — ONDANSETRON HCL 4 MG/2ML IJ SOLN
INTRAMUSCULAR | Status: DC | PRN
  Administered 2019-06-08: 4 mg via INTRAVENOUS

## 2019-06-08 MED ORDER — PROPOFOL 10 MG/ML IV BOLUS
INTRAVENOUS | Status: DC | PRN
  Administered 2019-06-08: 20 mg via INTRAVENOUS

## 2019-06-08 MED ORDER — LIDOCAINE HCL (PF) 1 % IJ SOLN: 5.0000 mL | INTRAMUSCULAR | Status: DC | PRN

## 2019-06-08 MED ORDER — SODIUM CHLORIDE 0.9 % IV SOLN
INTRAVENOUS | Status: DC | PRN
  Administered 2019-06-08: 500 mL

## 2019-06-08 MED ORDER — MIDAZOLAM HCL 2 MG/2ML IJ SOLN
INTRAMUSCULAR | Status: AC
  Filled 2019-06-08: qty 2

## 2019-06-08 MED ORDER — EPHEDRINE 5 MG/ML INJ
INTRAVENOUS | Status: AC
  Filled 2019-06-08: qty 10

## 2019-06-08 MED ORDER — HYDROMORPHONE HCL 1 MG/ML IJ SOLN: 0.2500 mg | INTRAMUSCULAR | Status: DC | PRN

## 2019-06-08 MED ORDER — HEPARIN SODIUM (PORCINE) 1000 UNIT/ML DIALYSIS
1000.0000 [IU] | INTRAMUSCULAR | Status: DC | PRN
  Filled 2019-06-08: qty 1

## 2019-06-08 MED ORDER — PROMETHAZINE HCL 25 MG/ML IJ SOLN: 6.2500 mg | INTRAMUSCULAR | Status: DC | PRN

## 2019-06-08 MED ORDER — PROPOFOL 500 MG/50ML IV EMUL
INTRAVENOUS | Status: DC | PRN
  Administered 2019-06-08: 75 ug/kg/min via INTRAVENOUS

## 2019-06-08 MED ORDER — ALTEPLASE 2 MG IJ SOLR: 2.0000 mg | Freq: Once | INTRAMUSCULAR | Status: DC | PRN

## 2019-06-08 MED ORDER — PENTAFLUOROPROP-TETRAFLUOROETH EX AERO: 1.0000 "application " | INHALATION_SPRAY | CUTANEOUS | Status: DC | PRN

## 2019-06-08 MED ORDER — LIDOCAINE-EPINEPHRINE 0.5 %-1:200000 IJ SOLN
INTRAMUSCULAR | Status: AC
  Filled 2019-06-08: qty 1

## 2019-06-08 MED ORDER — SODIUM CHLORIDE 0.9 % IV SOLN
INTRAVENOUS | Status: AC
  Filled 2019-06-08: qty 1.2

## 2019-06-08 MED ORDER — MUPIROCIN 2 % EX OINT: 1.0000 "application " | TOPICAL_OINTMENT | Freq: Two times a day (BID) | CUTANEOUS | Status: DC

## 2019-06-08 MED ORDER — OXYCODONE HCL 5 MG/5ML PO SOLN: 5.0000 mg | Freq: Once | ORAL | Status: DC | PRN

## 2019-06-08 MED ORDER — FENTANYL CITRATE (PF) 250 MCG/5ML IJ SOLN
INTRAMUSCULAR | Status: AC
  Filled 2019-06-08: qty 5

## 2019-06-08 MED ORDER — SODIUM CHLORIDE 0.9 % IV SOLN
125.0000 mg | Freq: Every day | INTRAVENOUS | Status: AC
  Administered 2019-06-08 – 2019-06-10 (×3): 125 mg via INTRAVENOUS
  Filled 2019-06-08 (×5): qty 10

## 2019-06-08 MED ORDER — OXYCODONE HCL 5 MG PO TABS: 5.0000 mg | ORAL_TABLET | Freq: Once | ORAL | Status: DC | PRN

## 2019-06-08 MED ORDER — ONDANSETRON HCL 4 MG/2ML IJ SOLN
INTRAMUSCULAR | Status: AC
  Filled 2019-06-08: qty 2

## 2019-06-08 MED ORDER — HEPARIN SODIUM (PORCINE) 1000 UNIT/ML IJ SOLN
INTRAMUSCULAR | Status: AC
  Filled 2019-06-08: qty 1

## 2019-06-08 MED ORDER — SODIUM CHLORIDE 0.9 % IV SOLN
INTRAVENOUS | Status: DC
  Administered 2019-06-08 – 2019-06-18 (×4): via INTRAVENOUS

## 2019-06-08 MED ORDER — 0.9 % SODIUM CHLORIDE (POUR BTL) OPTIME
TOPICAL | Status: DC | PRN
  Administered 2019-06-08: 1000 mL

## 2019-06-08 MED ORDER — CHLORHEXIDINE GLUCONATE CLOTH 2 % EX PADS
6.0000 | MEDICATED_PAD | Freq: Every day | CUTANEOUS | Status: DC
  Administered 2019-06-09 – 2019-07-01 (×10): 6 via TOPICAL

## 2019-06-08 SURGICAL SUPPLY — 62 items
ARMBAND PINK RESTRICT EXTREMIT (MISCELLANEOUS) ×6 IMPLANT
BAG DECANTER FOR FLEXI CONT (MISCELLANEOUS) ×2 IMPLANT
BIOPATCH RED 1 DISK 7.0 (GAUZE/BANDAGES/DRESSINGS) ×3 IMPLANT
BIOPATCH RED 1IN DISK 7.0MM (GAUZE/BANDAGES/DRESSINGS) ×1
CANISTER SUCT 3000ML PPV (MISCELLANEOUS) ×4 IMPLANT
CANNULA VESSEL 3MM 2 BLNT TIP (CANNULA) ×4 IMPLANT
CATH EMB 4FR 40CM (CATHETERS) ×2 IMPLANT
CATH PALINDROME RT-P 15FX19CM (CATHETERS) IMPLANT
CATH PALINDROME RT-P 15FX23CM (CATHETERS) ×2 IMPLANT
CATH PALINDROME RT-P 15FX28CM (CATHETERS) IMPLANT
CATH PALINDROME RT-P 15FX55CM (CATHETERS) IMPLANT
CLIP LIGATING EXTRA MED SLVR (CLIP) ×4 IMPLANT
CLIP LIGATING EXTRA SM BLUE (MISCELLANEOUS) ×4 IMPLANT
COVER PROBE W GEL 5X96 (DRAPES) ×6 IMPLANT
COVER SURGICAL LIGHT HANDLE (MISCELLANEOUS) ×4 IMPLANT
COVER WAND RF STERILE (DRAPES) ×2 IMPLANT
DECANTER SPIKE VIAL GLASS SM (MISCELLANEOUS) ×4 IMPLANT
DERMABOND ADVANCED (GAUZE/BANDAGES/DRESSINGS) ×4
DERMABOND ADVANCED .7 DNX12 (GAUZE/BANDAGES/DRESSINGS) ×2 IMPLANT
DRAPE C-ARM 42X72 X-RAY (DRAPES) ×4 IMPLANT
DRAPE CHEST BREAST 15X10 FENES (DRAPES) ×4 IMPLANT
ELECT REM PT RETURN 9FT ADLT (ELECTROSURGICAL) ×4
ELECTRODE REM PT RTRN 9FT ADLT (ELECTROSURGICAL) ×2 IMPLANT
GAUZE 4X4 16PLY RFD (DISPOSABLE) ×4 IMPLANT
GAUZE SPONGE 4X4 12PLY STRL LF (GAUZE/BANDAGES/DRESSINGS) ×2 IMPLANT
GLOVE BIOGEL PI IND STRL 7.0 (GLOVE) IMPLANT
GLOVE BIOGEL PI IND STRL 7.5 (GLOVE) IMPLANT
GLOVE BIOGEL PI INDICATOR 7.0 (GLOVE) ×6
GLOVE BIOGEL PI INDICATOR 7.5 (GLOVE) ×2
GLOVE ECLIPSE 7.0 STRL STRAW (GLOVE) ×2 IMPLANT
GLOVE SS BIOGEL STRL SZ 7.5 (GLOVE) ×2 IMPLANT
GLOVE SUPERSENSE BIOGEL SZ 7.5 (GLOVE) ×4
GLOVE SURG SS PI 6.5 STRL IVOR (GLOVE) ×4 IMPLANT
GOWN STRL REUS W/ TWL LRG LVL3 (GOWN DISPOSABLE) ×6 IMPLANT
GOWN STRL REUS W/TWL LRG LVL3 (GOWN DISPOSABLE) ×8
KIT BASIN OR (CUSTOM PROCEDURE TRAY) ×4 IMPLANT
KIT TURNOVER KIT B (KITS) ×4 IMPLANT
NDL 18GX1X1/2 (RX/OR ONLY) (NEEDLE) ×2 IMPLANT
NDL HYPO 25GX1X1/2 BEV (NEEDLE) ×2 IMPLANT
NEEDLE 18GX1X1/2 (RX/OR ONLY) (NEEDLE) ×4 IMPLANT
NEEDLE 22X1 1/2 (OR ONLY) (NEEDLE) IMPLANT
NEEDLE HYPO 25GX1X1/2 BEV (NEEDLE) ×4 IMPLANT
NS IRRIG 1000ML POUR BTL (IV SOLUTION) ×4 IMPLANT
PACK CV ACCESS (CUSTOM PROCEDURE TRAY) ×4 IMPLANT
PACK SURGICAL SETUP 50X90 (CUSTOM PROCEDURE TRAY) ×4 IMPLANT
PAD ARMBOARD 7.5X6 YLW CONV (MISCELLANEOUS) ×8 IMPLANT
SOAP 2 % CHG 4 OZ (WOUND CARE) ×4 IMPLANT
SPONGE LAP 18X18 RF (DISPOSABLE) ×2 IMPLANT
SUT ETHILON 3 0 PS 1 (SUTURE) ×4 IMPLANT
SUT PROLENE 6 0 CC (SUTURE) ×6 IMPLANT
SUT VIC AB 3-0 SH 27 (SUTURE) ×4
SUT VIC AB 3-0 SH 27X BRD (SUTURE) ×2 IMPLANT
SUT VICRYL 4-0 PS2 18IN ABS (SUTURE) ×4 IMPLANT
SYR 10ML LL (SYRINGE) ×4 IMPLANT
SYR 20ML LL LF (SYRINGE) ×4 IMPLANT
SYR 5ML LL (SYRINGE) ×6 IMPLANT
SYR CONTROL 10ML LL (SYRINGE) ×2 IMPLANT
TAPE CLOTH SURG 4X10 WHT LF (GAUZE/BANDAGES/DRESSINGS) ×2 IMPLANT
TOWEL GREEN STERILE (TOWEL DISPOSABLE) ×6 IMPLANT
TOWEL GREEN STERILE FF (TOWEL DISPOSABLE) ×4 IMPLANT
UNDERPAD 30X30 (UNDERPADS AND DIAPERS) ×4 IMPLANT
WATER STERILE IRR 1000ML POUR (IV SOLUTION) ×4 IMPLANT

## 2019-06-08 NOTE — Progress Notes (Signed)
Physical Therapy Cancellation Note   06/08/19 1216  PT Visit Information  Last PT Received On 06/08/19  Reason Eval/Treat Not Completed Patient at procedure or test/unavailable. Pt off unit. PT will continue to follow acutely.    Earney Navy, PTA Acute Rehabilitation Services Pager: [phone] Office: [phone]

## 2019-06-08 NOTE — OR Nursing (Signed)
Patient's ring was returned to her and placed on her left ring finger at end of procedure prior to leaving the OR. Witnessed by CRNA, scrub and circulator in room.

## 2019-06-08 NOTE — Interval H&P Note (Signed)
History and Physical Interval Note:  06/08/2019 1:04 PM  Taylor Tyler  has presented today for surgery, with the diagnosis of end stage renal disease.  The various methods of treatment have been discussed with the patient and family. After consideration of risks, benefits and other options for treatment, the patient has consented to  Procedure(s): ARTERIOVENOUS (AV) FISTULA CREATION VERSUS GRAFT (Left) INSERTION OF DIALYSIS CATHETER (N/A) as a surgical intervention.  The patient's history has been reviewed, patient examined, no change in status, stable for surgery.  I have reviewed the patient's chart and labs.  Questions were answered to the patient's satisfaction.     Curt Jews

## 2019-06-08 NOTE — Progress Notes (Signed)
PROGRESS NOTE    Taylor Tyler  CWC:[id number] DOB: 1940-09-02 DOA: 05/30/2019 PCP: McLean-Scocuzza, Nino Glow, MD      Brief Narrative:  [name] is a 78 y.o. F with DM, HTN, CKD IV baseline Cr 2.5, anemia, hypothyroidism, and nonhealing lower back wound after surgery who presented with fall and leg pain.  In the ER, Cr >5.  She was noted to have lower extremity edema, which she said has been accumulating for several weeks as well as progressive shortness of breath.   She was admitted for AoCRF, fluid overload.   Assessment & Plan:  Acute on chronic renal failure Baseline creatinine 2.2-2.5.  Urinalysis bland.  Renal ultrasound unremarkable.  Nephrology consulted.    Urine output per day: 700 cc > 350 cc > 2350 cc  >700 cc > 2300 cc > 1100cc yesterday Creatinine trend with diuresis: 5.74 > 6.13 > 5.79 > 5.68 > 5.46 > 5.59 > 5.66 > 5.91 > 5.86>6.01 BUN up to 88, bicarb 17, K normal  -Consult Nephrology, appreciate expert cares -Strict I/Os, daily BMP --Vein mapping study completed vascular surgery planning for left AV graft vs fistula and TDC placement today  Acute on chronic diastolic CHF -Echocardiogram with grade 2 diastolic dysfunction but normal ejection fraction -Continue IV Furosemide decreased to twice daily scheduled per nephrology -Strict I/Os, daily weights, telemetry  -Daily monitoring renal function   Hypothyroidism TSH slightly high, free T4 normal -Continue levothyroxine  Diabetes GLucose well controlled -Continue sliding scale correction insulin -Continue atorvastatin, gabapentin  Anemia of chronic kidney disease Hemoglobin stable relative to baseline -Continue iron  Hypertension Hypertensive urgency -Continue Lasix, amlodipine, hydralazine, labetalol -Stop clonidine given bradycardia worsened     MDM and disposition: The below labs and imaging reports reviewed and summarized above.  Medication management as above. This is a severe exacerbation  of chronic disease, not yet improving.  The patient was admitted with acute on chronic renal failure.     DVT prophylaxis: Heparin Code Status: Full code Family Communication:     Consultants:   Nephrology  Procedures:   11/2 echo  Antimicrobials:       Subjective: Patient denies any acute complaints today including shortness of breath or cough.  She believes her bilateral lower extremity swelling is slightly improved from yesterday.  Objective: Vitals:   06/08/19 0600 06/08/19 0911 06/08/19 1133 06/08/19 1222  BP:  (!) 167/57 (!) 158/63   Pulse:  64 61   Resp:  18    Temp:  98.2 F (36.8 C) 98 F (36.7 C)   TempSrc:  Oral Oral   SpO2:  97%    Weight: 93.8 kg   93.8 kg  Height:    5\' 3"  (1.6 m)    Intake/Output Summary (Last 24 hours) at 06/08/2019 1428 Last data filed at 06/07/2019 2137 Gross per 24 hour  Intake 240 ml  Output 700 ml  Net -460 ml   Filed Weights   06/07/19 0605 06/08/19 0600 06/08/19 1222  Weight: 94.3 kg 93.8 kg 93.8 kg    Examination: General appearance: well nourished adult female, in no acute distress.   HEENT: Anicteric, conjunctiva pink, lids and lashes normal. No nasal deformity, discharge, epistaxis.  Lips moist, dentures in place. OP normal, no oral lesions.   Skin: Warm and dry.  No suspicious rashes or lesions. Cardiac: RRR, no murmurs appreciated.  1+ LE edema.    Respiratory: Normal respiratory rate and rhythm. CTAB without rales or wheezes. Abdomen: Abdomen soft.  No ttp or guarding. No ascites, distension, hepatosplenomegaly.   MSK: No deformities or effusions of the large joints of the upper or lower extremities bilaterally. Neuro: Awake and alert. Moves all extremities equally and with normal coordination.  Marland Kitchen Speech fluent.    Psych: Sensorium intact and responding to questions, attention normal. Affect normal.  Judgment and insight appear    LOS: 9 days    Time spent: 25 minutes   Taylor Capuchin, MD [facility] 06/08/2019, 2:28 PM     Please page through [place]:  [email or web address] Password [email or web address] If 7PM-7AM, please contact night-coverage

## 2019-06-08 NOTE — Progress Notes (Signed)
   06/08/19 1200  OT Visit Information  Last OT Received On 06/08/19  Assistance Needed +1  Reason Eval/Treat Not Completed Patient at procedure or test/ unavailable;Other (comment) (Pt is currently off unit.)  History of Present Illness Pt is a 78 y.o. female admitted 05/30/19 after fall at home, laying on the floor at least 8 hours until found by son, also with worsening SOB and LE edema. VQ scan with low probability for PE. Worked up for HF. Other PMH includes CKD III, DM2, DJD s/p lumbar sx (2018), arthritis.   Plan to reattempt at a later time/date.   Tyrone Schimke, OT Acute Rehabilitation Services Pager: [phone] Office: [phone]

## 2019-06-08 NOTE — Progress Notes (Signed)
[facility] NEPHROLOGY PROGRESS NOTE  Assessment/ Plan: Pt is a 78 y.o. yo female  W/ hx of DM, HTN, CHF, anemia, HLD, CKD4, admitted for CHF exacerbation, nephrology consulted for AKI on CKD.   # AKI on CKD 4, non oliguric - Baseline cr~3. AKI likely 2/2 CHF exacerbation. Renal u/s consistent with CKD, no hydronephrosis. Metolazone 50mg  TID added yesterday to IV lasix 80mg  BID. Cr continues to worsen.  initiating HD and consulting vascular for tunneled HD cath. .  # metabolic acidosis: sodium bicarbonate 1300mg  BID started yesterday. Still acidotic with slight improvement - continue sodium bicarb.  # anemia 2/2 CKD: iron saturatoin 17%, currently on IV iron. Continue ESA. Monitor CBC # Secondary hyperparathyroidism: PTH level 99.  Monitor phosphorus level. 6.3 today.  - Consider starting phosphate binder.   # HTN/volume - diuretics as above. Continue current antihypertensive medications  Subjective:  Patient states she has no complaints today.  No SOB, no CP.  Pt does not have any questions on dialysis as she has had two relatives with it and she discussed it with Dr. Carolin Sicks yesterday.   Objective General:NAD, comfortable. Laying in bed.  Heart:RRR, s1s2 nl  Lungs:clear b/l, no crackle Abdomen:soft, mildly TTP RLQ Extremities:some TTP above the ankles bilaterally with 1+ edema b/l   Vital signs in last 24 hours: Vitals:   06/07/19 1214 06/07/19 2039 06/08/19 0543 06/08/19 0600  BP: (!) 148/59 (!) 147/46 (!) 158/52   Pulse: 60 68 62   Resp: 20 18 18    Temp: 98.3 F (36.8 C) 98.7 F (37.1 C) 98.4 F (36.9 C)   TempSrc: Oral Oral Oral   SpO2: 100% 98% 97%   Weight:    93.8 kg  Height:       Weight change: -0.544 kg  Intake/Output Summary (Last 24 hours) at 06/08/2019 6010 Last data filed at 06/07/2019 2137 Gross per 24 hour  Intake 480 ml  Output 700 ml  Net -220 ml    Labs: Basic Metabolic Panel: Recent Labs  Lab 06/06/19 0634 06/07/19 0345  06/08/19 0327  NA 133* 133* 136  K 4.5 4.5 4.4  CL 103 104 104  CO2 17* 17* 18*  GLUCOSE 94 96 89  BUN 81* 88* 93*  CREATININE 5.86* 6.01* 6.21*  CALCIUM 8.2* 8.1* 8.4*  PHOS  --  6.2* 6.3*   Liver Function Tests: Recent Labs  Lab 06/05/19 0527 06/06/19 0634 06/07/19 0345 06/08/19 0327  AST 21 18  --   --   ALT 18 16  --   --   ALKPHOS 41 45  --   --   BILITOT 0.4 0.4  --   --   PROT 6.5 6.3*  --   --   ALBUMIN 1.7* 1.6* 1.6* 1.6*   No results for input(s): LIPASE, AMYLASE in the last 168 hours. No results for input(s): AMMONIA in the last 168 hours. CBC: Recent Labs  Lab 06/04/19 0645 06/05/19 0527 06/06/19 0634 06/07/19 0345 06/08/19 0327  WBC 12.7* 13.7* 16.1* 18.1* 19.3*  HGB 7.4* 7.6* 7.7* 7.7* 7.5*  HCT 24.0* 25.1* 24.6* 25.4* 24.2*  MCV 83.9 84.5 83.1 84.9 84.3  PLT 363 376 351 385 389   Cardiac Enzymes: No results for input(s): CKTOTAL, CKMB, CKMBINDEX, TROPONINI in the last 168 hours. CBG: Recent Labs  Lab 06/06/19 2139 06/07/19 0602 06/07/19 1130 06/07/19 1659 06/07/19 2112  GLUCAP 128* 92 89 79 101*    Iron Studies: No results for input(s): IRON, TIBC, TRANSFERRIN,  FERRITIN in the last 72 hours. Studies/Results: Vas [place] Upper Ext Vein Mapping (pre-op Avf)  Result Date: 06/07/2019 UPPER EXTREMITY VEIN MAPPING  Indications: Pre-access. Comparison Study: No prior Performing Technologist: Sharion Dove RVS  Examination Guidelines: A complete evaluation includes B-mode imaging, spectral Doppler, color Doppler, and power Doppler as needed of all accessible portions of each vessel. Bilateral testing is considered an integral part of a complete examination. Limited examinations for reoccurring indications may be performed as noted. +-----------------+-------------+----------+---------+ Right Cephalic   Diameter (cm)Depth (cm)Findings  +-----------------+-------------+----------+---------+ Prox upper arm       0.28        0.87              +-----------------+-------------+----------+---------+ Mid upper arm        0.27        0.43             +-----------------+-------------+----------+---------+ Dist upper arm       0.28        0.46   branching +-----------------+-------------+----------+---------+ Antecubital fossa    0.47        0.75   branching +-----------------+-------------+----------+---------+ Prox forearm         0.42        0.48             +-----------------+-------------+----------+---------+ Mid forearm          0.31        0.61   branching +-----------------+-------------+----------+---------+ Wrist                0.14        0.49             +-----------------+-------------+----------+---------+ +-----------------+-------------+----------+---------+ Left Cephalic    Diameter (cm)Depth (cm)Findings  +-----------------+-------------+----------+---------+ Prox upper arm       0.43        1.46             +-----------------+-------------+----------+---------+ Mid upper arm        0.40        1.00             +-----------------+-------------+----------+---------+ Dist upper arm       0.42        0.74             +-----------------+-------------+----------+---------+ Antecubital fossa    0.51        0.27             +-----------------+-------------+----------+---------+ Prox forearm         0.35        0.36   branching +-----------------+-------------+----------+---------+ Mid forearm          0.38        0.39   branching +-----------------+-------------+----------+---------+ Wrist                0.31        0.34             +-----------------+-------------+----------+---------+ *See table(s) above for measurements and observations.  Diagnosing physician: Ruta Hinds MD Electronically signed by Ruta Hinds MD on 06/07/2019 at 9:32:51 AM.    Final     Medications: Infusions: . cefUROXime (ZINACEF)  IV      Scheduled Medications: . amLODipine  10 mg Oral Daily   . atorvastatin  40 mg Oral q1800  . darbepoetin (ARANESP) injection - NON-DIALYSIS  150 mcg Subcutaneous Q Sat-1800  . ferrous sulfate  325 mg Oral Q breakfast  . furosemide  80 mg Intravenous Q12H  .  gabapentin  300 mg Oral QHS  . heparin  5,000 Units Subcutaneous Q8H  . hydrALAZINE  50 mg Oral Q8H  . insulin aspart  0-9 Units Subcutaneous TID WC  . labetalol  200 mg Oral BID  . levothyroxine  100 mcg Oral QAC breakfast  . mupirocin ointment  1 application Nasal BID  . sodium bicarbonate  1,300 mg Oral BID    have reviewed scheduled and prn medications.   [name] 06/08/2019,7:22 AM  LOS: 9 days  Pager: [phone]

## 2019-06-08 NOTE — Anesthesia Preprocedure Evaluation (Addendum)
Anesthesia Evaluation  Patient identified by MRN, date of birth, ID band Patient awake    Reviewed: Allergy & Precautions, NPO status , Patient's Chart, lab work & pertinent test results  Airway Mallampati: II  TM Distance: >3 FB Neck ROM: Full    Dental  (+) Teeth Intact, Dental Advisory Given   Pulmonary    breath sounds clear to auscultation       Cardiovascular hypertension, +CHF   Rhythm:Regular Rate:Normal     Neuro/Psych    GI/Hepatic   Endo/Other  diabetesHypothyroidism   Renal/GU ARF, ESRF and DialysisRenal disease     Musculoskeletal  (+) Arthritis , Osteoarthritis,    Abdominal (+) + obese,   Peds  Hematology  (+) anemia ,   Anesthesia Other Findings   Reproductive/Obstetrics                             Anesthesia Physical  Anesthesia Plan  ASA: III  Anesthesia Plan: MAC   Post-op Pain Management:    Induction: Intravenous  PONV Risk Score and Plan: 2 and Ondansetron, Midazolam and Treatment may vary due to age or medical condition  Airway Management Planned: Simple Face Mask  Additional Equipment:   Intra-op Plan:   Post-operative Plan:   Informed Consent: I have reviewed the patients History and Physical, chart, labs and discussed the procedure including the risks, benefits and alternatives for the proposed anesthesia with the patient or authorized representative who has indicated his/her understanding and acceptance.     Dental advisory given  Plan Discussed with: CRNA and Anesthesiologist  Anesthesia Plan Comments:        Anesthesia Quick Evaluation

## 2019-06-08 NOTE — Care Management Important Message (Signed)
Important Message  Patient Details  Name: Taylor Tyler MRN: [id number] Date of Birth: 1941/03/09   Medicare Important Message Given:  Yes     [name] 06/08/2019, 1:20 PM

## 2019-06-08 NOTE — Op Note (Signed)
    OPERATIVE REPORT  DATE OF SURGERY: 06/08/2019  PATIENT: Taylor Tyler, 78 y.o. female MRN: [id number]  DOB: 1941/06/20  PRE-OPERATIVE DIAGNOSIS: End-stage renal disease  POST-OPERATIVE DIAGNOSIS:  Same  PROCEDURE: #1 right IJ tunneled hemodialysis catheter with SonoSite visualization, #2 left brachiocephalic AV fistula creation  SURGEON:  Curt Jews, M.D.  PHYSICIAN ASSISTANT: Gerri Lins, PA-C  ANESTHESIA: Local with sedation  EBL: per anesthesia record  Total I/O In: 300 [I.V.:300] Out: 10 [Blood:10]  BLOOD ADMINISTERED: none  DRAINS: none  SPECIMEN: none  COUNTS CORRECT:  YES  PATIENT DISPOSITION:  PACU - hemodynamically stable  PROCEDURE DETAILS: Patient was taken operating place evaluation where the area of the right and left neck and chest prepped draped in sterile fashion.  Patient was placed in Trendelenburg position.  Using SonoSite for visualization the right internal jugular vein was accessed using local anesthesia with an 18-gauge needle.  A guidewire was passed down the level the right atrium this was confirmed with fluoroscopy.  Dilator and peel-away sheath was passed over the guidewire and the dilator and guidewire removed.  A 23 cm tunnel catheter was brought through the peel-away sheath and this was removed.  The catheter was brought through the subcutaneous tunnel through a separate stab incision.  The 2 lm were attached and both lumens flushed and aspirated easily and were locked with 1000/cc heparin.  The catheter was secured to the skin with a 3-0 nylon stitch and the other site was closed with 4 subcuticular Vicryl stitch.  Sterile dressing was applied.  Attention was then turned to the left arm.  The vein was imaged with SonoSite.  Patient did have a good size cephalic vein as seen in preoperative imaging.  She had a prior IV in the antecubital space and there was clot in the vein at the antecubital space.  Patient had a strong brachial pulse.   Using local anesthesia incision was made over the antecubital space and carried down to isolate the vein and the brachial artery.  Tributary branches were ligated with 3-0 and 4 silk ties and divided.  The vein was ligated distally and divided.  4 Fogarty catheter was used to remove clot from the antecubital vein.  The vein above this was without thrombus.  The vein caliber was good.  The vein was flushed with heparinized saline and reoccluded.  The brachial artery was occluded proximally distally and was opened with 11 blade and sent longstanding with Potts scissors.  The vein was brought into approximation was cut the appropriate length and was spatulated.  The vein was sewn end-to-side to the artery with a running 6-0 Prolene suture.  Clamps removed and excellent thrill was noted.  Wounds irrigated with saline.  Hemostasis electrocautery.  Wounds were closed with 3-0 Vicryl in the subcutaneous septic tissue.  Sterile dressing was applied and the patient was transferred to the recovery in stable condition   Rosetta Posner, M.D., [facility] 06/08/2019 3:43 PM

## 2019-06-08 NOTE — Transfer of Care (Signed)
Immediate Anesthesia Transfer of Care Note  Patient: Taylor Tyler  Procedure(s) Performed: INSERTION OF DIALYSIS CATHETER RIGHT INTERNAL JUGULAR (Right Neck) Arteriovenous (Av) Fistula Creation (Left ) Thrombectomy Brachial Artery (Left )  Patient Location: PACU  Anesthesia Type:MAC  Level of Consciousness: awake and alert   Airway & Oxygen Therapy: Patient Spontanous Breathing and Patient connected to nasal cannula oxygen  Post-op Assessment: Report given to RN and Post -op Vital signs reviewed and stable  Post vital signs: Reviewed and stable  Last Vitals:  Vitals Value Taken Time  BP 152/66 06/08/19 1534  Temp    Pulse 61 06/08/19 1536  Resp 12 06/08/19 1536  SpO2 97 % 06/08/19 1536  Vitals shown include unvalidated device data.  Last Pain:  Vitals:   06/08/19 1133  TempSrc: Oral  PainSc:       Patients Stated Pain Goal: 0 (75/43/60 6770)  Complications: No apparent anesthesia complications

## 2019-06-09 ENCOUNTER — Encounter (HOSPITAL_COMMUNITY): Payer: Self-pay | Admitting: Vascular Surgery

## 2019-06-09 LAB — HEPATITIS B CORE ANTIBODY, TOTAL: Hep B Core Total Ab: NONREACTIVE

## 2019-06-09 LAB — RENAL FUNCTION PANEL
Albumin: 1.8 g/dL — ABNORMAL LOW (ref 3.5–5.0)
Anion gap: 14 (ref 5–15)
BUN: 47 mg/dL — ABNORMAL HIGH (ref 8–23)
CO2: 22 mmol/L (ref 22–32)
Calcium: 8.3 mg/dL — ABNORMAL LOW (ref 8.9–10.3)
Chloride: 101 mmol/L (ref 98–111)
Creatinine, Ser: 3.84 mg/dL — ABNORMAL HIGH (ref 0.44–1.00)
GFR calc Af Amer: 12 mL/min — ABNORMAL LOW (ref >60)
GFR calc non Af Amer: 11 mL/min — ABNORMAL LOW (ref >60)
Glucose, Bld: 97 mg/dL (ref 70–99)
Phosphorus: 4.7 mg/dL — ABNORMAL HIGH (ref 2.5–4.6)
Potassium: 3.8 mmol/L (ref 3.5–5.1)
Sodium: 137 mmol/L (ref 135–145)

## 2019-06-09 LAB — HEPATITIS PANEL, ACUTE
HCV Ab: NONREACTIVE
Hep A IgM: NONREACTIVE
Hep B C IgM: NONREACTIVE
Hepatitis B Surface Ag: NONREACTIVE

## 2019-06-09 LAB — GLUCOSE, CAPILLARY
Glucose-Capillary: 111 mg/dL — ABNORMAL HIGH (ref 70–99)
Glucose-Capillary: 152 mg/dL — ABNORMAL HIGH (ref 70–99)
Glucose-Capillary: 83 mg/dL (ref 70–99)
Glucose-Capillary: 90 mg/dL (ref 70–99)
Glucose-Capillary: 97 mg/dL (ref 70–99)

## 2019-06-09 LAB — CBC
HCT: 26.4 % — ABNORMAL LOW (ref 36.0–46.0)
Hemoglobin: 8.1 g/dL — ABNORMAL LOW (ref 12.0–15.0)
MCH: 25.8 pg — ABNORMAL LOW (ref 26.0–34.0)
MCHC: 30.7 g/dL (ref 30.0–36.0)
MCV: 84.1 fL (ref 80.0–100.0)
Platelets: 363 10*3/uL (ref 150–400)
RBC: 3.14 MIL/uL — ABNORMAL LOW (ref 3.87–5.11)
RDW: 16.5 % — ABNORMAL HIGH (ref 11.5–15.5)
WBC: 16.9 10*3/uL — ABNORMAL HIGH (ref 4.0–10.5)
nRBC: 0.2 % (ref 0.0–0.2)

## 2019-06-09 LAB — MAGNESIUM: Magnesium: 1.7 mg/dL (ref 1.7–2.4)

## 2019-06-09 MED ORDER — ALTEPLASE 2 MG IJ SOLR: 2.0000 mg | Freq: Once | INTRAMUSCULAR | Status: DC | PRN

## 2019-06-09 MED ORDER — PENTAFLUOROPROP-TETRAFLUOROETH EX AERO: 1.0000 "application " | INHALATION_SPRAY | CUTANEOUS | Status: DC | PRN

## 2019-06-09 MED ORDER — CHLORHEXIDINE GLUCONATE CLOTH 2 % EX PADS
6.0000 | MEDICATED_PAD | Freq: Every day | CUTANEOUS | Status: DC
  Administered 2019-06-09: 6 via TOPICAL

## 2019-06-09 MED ORDER — HEPARIN SODIUM (PORCINE) 1000 UNIT/ML DIALYSIS: 20.0000 [IU]/kg | INTRAMUSCULAR | Status: DC | PRN

## 2019-06-09 MED ORDER — LIDOCAINE HCL (PF) 1 % IJ SOLN: 5.0000 mL | INTRAMUSCULAR | Status: DC | PRN

## 2019-06-09 MED ORDER — SODIUM CHLORIDE 0.9 % IV SOLN: 100.0000 mL | INTRAVENOUS | Status: DC | PRN

## 2019-06-09 MED ORDER — GABAPENTIN 100 MG PO CAPS
200.0000 mg | ORAL_CAPSULE | Freq: Every day | ORAL | Status: DC
  Administered 2019-06-09 – 2019-07-07 (×23): 200 mg via ORAL
  Filled 2019-06-09 (×26): qty 2

## 2019-06-09 MED ORDER — HEPARIN SODIUM (PORCINE) 1000 UNIT/ML IJ SOLN
INTRAMUSCULAR | Status: AC
  Filled 2019-06-09: qty 4

## 2019-06-09 MED ORDER — HEPARIN SODIUM (PORCINE) 1000 UNIT/ML DIALYSIS
1000.0000 [IU] | INTRAMUSCULAR | Status: DC | PRN
  Filled 2019-06-09: qty 1

## 2019-06-09 MED ORDER — CHLORHEXIDINE GLUCONATE CLOTH 2 % EX PADS: 6.0000 | MEDICATED_PAD | Freq: Every day | CUTANEOUS | Status: DC

## 2019-06-09 MED ORDER — LIDOCAINE-PRILOCAINE 2.5-2.5 % EX CREA
1.0000 "application " | TOPICAL_CREAM | CUTANEOUS | Status: DC | PRN
  Filled 2019-06-09: qty 5

## 2019-06-09 NOTE — Progress Notes (Signed)
Renal Navigator notified by Nephrologist/Dr. Carolin Sicks that patient needs OP HD referral submitted. Renal Navigator attempted to meet with patient at HD bedside when treatment was completed, but she did not appear capable of discussing this at this time. She was confused and not fully alert.  Renal Navigator will attempt to follow up with patient tomorrow.  Alphonzo Cruise, [place] Renal Navigator [phone]

## 2019-06-09 NOTE — Progress Notes (Signed)
Physical Therapy Cancellation Note   06/09/19 1255  PT Visit Information  Last PT Received On 06/09/19  Reason Eval/Treat Not Completed Patient at procedure or test/unavailable. Pt in HD. PT will continue to follow acutely.   Earney Navy, PTA Acute Rehabilitation Services Pager: [phone] Office: [phone]

## 2019-06-09 NOTE — Anesthesia Postprocedure Evaluation (Signed)
Anesthesia Post Note  Patient: Bo Merino  Procedure(s) Performed: INSERTION OF DIALYSIS CATHETER RIGHT INTERNAL JUGULAR (Right Neck) Arteriovenous (Av) Fistula Creation (Left ) Thrombectomy Brachial Artery (Left )     Patient location during evaluation: PACU Anesthesia Type: General Level of consciousness: awake and alert Pain management: pain level controlled Vital Signs Assessment: post-procedure vital signs reviewed and stable Respiratory status: spontaneous breathing, nonlabored ventilation and respiratory function stable Cardiovascular status: blood pressure returned to baseline and stable Postop Assessment: no apparent nausea or vomiting Anesthetic complications: no    Last Vitals:  Vitals:   06/09/19 1430 06/09/19 1500  BP: (!) 156/74 (!) 164/78  Pulse: 68 73  Resp: 15 20  Temp:  36.8 C  SpO2:  100%    Last Pain:  Vitals:   06/09/19 1500  TempSrc: Oral  PainSc: 0-No pain   Pain Goal: Patients Stated Pain Goal: 0 (06/08/19 1930)                 Lynda Rainwater

## 2019-06-09 NOTE — Progress Notes (Signed)
[facility] NEPHROLOGY PROGRESS NOTE  Assessment/ Plan: Pt is a 78 y.o. yo female  W/ hx of DM, HTN, CHF, anemia, HLD, CKD4, admitted for CHF exacerbation, nephrology consulted for AKI on CKD.   # AKI on CKD 4, non oliguric - Baseline cr~3. AKI likely 2/2 CHF exacerbation. Renal u/s consistent with CKD, no hydronephrosis. UA with WBC and proteinuria, no RBC. Tunneled HD cath placed and AV fistula created yesterday. Pt received first HD treatment last night. Plan for serial HD treatment today and tomorrow.  - need arrangement for outpatient HD   # metabolic acidosis: improved s/p HD last night. Continue to monitor.   # anemia 2/2 CKD: iron saturation 17%, currently on IV iron. Continue ESA. Monitor CBC  # Secondary hyperparathyroidism: PTH level 99.  Monitor phosphorus level.  Ph 7.3 yesterday evening, 4.7 this am.    # HTN/volume - diuretics as above. Continue current antihypertensive medications  Subjective:  Patient feels a little tired today. She does not remember going to HD last night.  She thinks she went to 'a class' but states she slept through it.    Objective General:NAD, comfortable. Laying in bed. Pt seems confused about HD session last night but is otherwise oriented to situation.  Heart:RRR, s1s2 nl  Lungs:clear b/l, no crackles Abdomen: nontender to palpation. Normal bowel sounds.  Extremities:some TTP above the ankles bilaterally with trace edema b/l   Vital signs in last 24 hours: Vitals:   06/08/19 2200 06/08/19 2220 06/08/19 2309 06/09/19 0436  BP: (!) 159/69 (!) 155/69 (!) 183/51 (!) 147/52  Pulse: 65 63 69 67  Resp:  16 20 17   Temp:  98.4 F (36.9 C) 98.6 F (37 C) 98.4 F (36.9 C)  TempSrc:  Oral Oral Oral  SpO2:  98% 100% 96%  Weight:  93 kg    Height:       Weight change: -0.004 kg  Intake/Output Summary (Last 24 hours) at 06/09/2019 0750 Last data filed at 06/09/2019 0443 Gross per 24 hour  Intake 770 ml  Output 1910 ml  Net -1140  ml    Labs: Basic Metabolic Panel: Recent Labs  Lab 06/08/19 0327 06/08/19 1903 06/09/19 0419  NA 136 136 137  K 4.4 4.2 3.8  CL 104 104 101  CO2 18* 17* 22  GLUCOSE 89 154* 97  BUN 93* 97* 47*  CREATININE 6.21* 6.25* 3.84*  CALCIUM 8.4* 8.5* 8.3*  PHOS 6.3* 7.3* 4.7*   Liver Function Tests: Recent Labs  Lab 06/05/19 0527 06/06/19 0634  06/08/19 0327 06/08/19 1903 06/09/19 0419  AST 21 18  --   --   --   --   ALT 18 16  --   --   --   --   ALKPHOS 41 45  --   --   --   --   BILITOT 0.4 0.4  --   --   --   --   PROT 6.5 6.3*  --   --   --   --   ALBUMIN 1.7* 1.6*   < > 1.6* 1.7* 1.8*   < > = values in this interval not displayed.   No results for input(s): LIPASE, AMYLASE in the last 168 hours. No results for input(s): AMMONIA in the last 168 hours. CBC: Recent Labs  Lab 06/06/19 0634 06/07/19 0345 06/08/19 0327 06/08/19 1903 06/09/19 0419  WBC 16.1* 18.1* 19.3* 18.6* 16.9*  HGB 7.7* 7.7* 7.5* 8.1* 8.1*  HCT 24.6*  25.4* 24.2* 26.3* 26.4*  MCV 83.1 84.9 84.3 84.6 84.1  PLT 351 385 389 370 363   Cardiac Enzymes: No results for input(s): CKTOTAL, CKMB, CKMBINDEX, TROPONINI in the last 168 hours. CBG: Recent Labs  Lab 06/08/19 0934 06/08/19 1056 06/08/19 1535 06/08/19 1648 06/09/19 0037  GLUCAP 100* 90 89 99 83    Iron Studies: No results for input(s): IRON, TIBC, TRANSFERRIN, FERRITIN in the last 72 hours. Studies/Results: Dg Fluoro Guide Cv Line-no Report  Result Date: 06/08/2019 Fluoroscopy was utilized by the requesting physician.  No radiographic interpretation.    Medications: Infusions: . sodium chloride 10 mL/hr at 06/08/19 1235  . ferric gluconate (FERRLECIT/NULECIT) IV 125 mg (06/08/19 1754)    Scheduled Medications: . amLODipine  10 mg Oral Daily  . atorvastatin  40 mg Oral q1800  . Chlorhexidine Gluconate Cloth  6 each Topical Q0600  . Chlorhexidine Gluconate Cloth  6 each Topical Q0600  . darbepoetin (ARANESP) injection -  NON-DIALYSIS  150 mcg Subcutaneous Q Sat-1800  . ferrous sulfate  325 mg Oral Q breakfast  . gabapentin  300 mg Oral QHS  . heparin      . heparin  5,000 Units Subcutaneous Q8H  . hydrALAZINE  50 mg Oral Q8H  . insulin aspart  0-9 Units Subcutaneous TID WC  . labetalol  200 mg Oral BID  . levothyroxine  100 mcg Oral QAC breakfast    have reviewed scheduled and prn medications.   [name] 06/09/2019,7:50 AM  LOS: 10 days

## 2019-06-09 NOTE — Progress Notes (Signed)
OT Cancellation Note  Patient Details Name: Taylor Tyler MRN: [id number] DOB: Oct 29, 1940   Cancelled Treatment:    Reason Eval/Treat Not Completed: Patient at procedure or test/ unavailable(HD). Will follow.  [name] 06/09/2019, 1:00 PM  Nestor Lewandowsky, OTR/L Acute Rehabilitation Services Pager: [phone] Office: [phone]

## 2019-06-09 NOTE — Progress Notes (Signed)
[name], female   DOB: 1940-11-18, 78 y.o.   MRN: [id number] No complaints.  Excellent thrill in her left upper arm AV fistula.  2-3+ left radial pulse with no steal symptoms.  Right IJ catheter in place.  Will not follow actively.  We will arrange office follow-up 1 month for fistula check

## 2019-06-09 NOTE — Progress Notes (Addendum)
PROGRESS NOTE    Taylor Tyler  NFA:[id number] DOB: [birth year] DOA: 05/30/2019 PCP: McLean-Scocuzza, Nino Glow, MD      Brief Narrative:  Taylor Tyler is a 78 y.o. F with DM, HTN, CKD IV baseline Cr 2.5, anemia, hypothyroidism, and nonhealing lower back wound after surgery who presented with fall and leg pain.  In the ER, Cr >5.  She was noted to have lower extremity edema, which she said has been accumulating for several weeks as well as progressive shortness of breath.   She was admitted for AoCRF, fluid overload and originally diuresed but then started on hemodialysis on 06/08/2019.   Assessment & Plan:  Acute on chronic renal failure Baseline creatinine 2.2-2.5.  Urinalysis bland.  Renal ultrasound unremarkable.  Nephrology consulted.   -Consult Nephrology, appreciate expert cares -Strict I/Os, daily BMP --Tunneled dialysis catheter placed in IJ and AV fistula placed in left arm on 06/08/2019; patient received her first round of hemodialysis on 06/08/2019 and will receive 2 more rounds including today and tomorrow -Case management and social work to help arrange outpatient hemodialysis  Acute on chronic diastolic CHF -Echocardiogram with grade 2 diastolic dysfunction but normal ejection fraction -Continue IV Furosemide decreased to twice daily scheduled per nephrology -Strict I/Os, daily weights, telemetry  -Daily monitoring renal function   Hypothyroidism TSH slightly high, free T4 normal -Continue levothyroxine  Diabetes GLucose well controlled -Continue sliding scale correction insulin -Continue atorvastatin, decrease gabapentin dose as patient is now on HD  Anemia of chronic kidney disease Hemoglobin stable relative to baseline -Continue iron  Hypertension Hypertensive urgency -Continue amlodipine, hydralazine, labetalol -Stopped clonidine given bradycardia worsened     MDM and disposition: The below labs and imaging reports reviewed and summarized above.   Medication management as above. This is a severe exacerbation of chronic disease that has resulted in the patient being started on hemodialysis.  The patient was admitted with acute on chronic renal failure.  The patient may be discharged once deemed stable by nephrology and outpatient hemodialysis is arranged.   DVT prophylaxis: Heparin Code Status: Full code Family Communication:     Consultants:   Nephrology  Procedures:   11/2 echo  Antimicrobials:       Subjective: No acute complaints today.  Patient received her first round of hemodialysis yesterday and is currently undergoing her second round today.  She denies feeling short of breath.  Objective: Vitals:   06/09/19 1330 06/09/19 1400 06/09/19 1430 06/09/19 1500  BP: (!) 160/76 (!) 173/76 (!) 156/74 (!) 164/78  Pulse: 70 72 68 73  Resp: 16 16 15 20   Temp:      TempSrc:      SpO2:    100%  Weight:      Height:        Intake/Output Summary (Last 24 hours) at 06/09/2019 1513 Last data filed at 06/09/2019 1500 Gross per 24 hour  Intake 950 ml  Output 3410 ml  Net -2460 ml   Filed Weights   06/08/19 2220 06/09/19 0808 06/09/19 1218  Weight: 93 kg 98.9 kg 104.3 kg    Examination: General appearance: well nourished adult female, in no acute distress.   HEENT: Anicteric, conjunctiva pink, lids and lashes normal. No nasal deformity, discharge, epistaxis.  Lips moist, dentures in place. OP normal, no oral lesions.   Skin: Warm and dry.  No suspicious rashes or lesions.  IJ tunneled catheter in place and +thrill in left upper extremity AV fistula Cardiac: RRR, no murmurs appreciated.  1+ LE edema.    Respiratory: Normal respiratory rate and rhythm. CTAB without rales or wheezes. Abdomen: Abdomen soft.  No ttp or guarding. No ascites, distension, hepatosplenomegaly.   MSK: No deformities or effusions of the large joints of the upper or lower extremities bilaterally. Neuro: Awake and alert. Moves all extremities  equally and with normal coordination.  Marland Kitchen Speech fluent.    Psych: Sensorium intact and responding to questions, attention normal. Affect normal.  Judgment and insight appear    LOS: 10 days    Time spent: 25 minutes   Charolotte Capuchin, MD [facility] 06/09/2019, 3:13 PM   Please page through [place]:  [email or web address] Password [email or web address] If 7PM-7AM, please contact night-coverage

## 2019-06-10 LAB — RENAL FUNCTION PANEL
Albumin: 1.8 g/dL — ABNORMAL LOW (ref 3.5–5.0)
Anion gap: 13 (ref 5–15)
BUN: 31 mg/dL — ABNORMAL HIGH (ref 8–23)
CO2: 24 mmol/L (ref 22–32)
Calcium: 8.3 mg/dL — ABNORMAL LOW (ref 8.9–10.3)
Chloride: 99 mmol/L (ref 98–111)
Creatinine, Ser: 3.66 mg/dL — ABNORMAL HIGH (ref 0.44–1.00)
GFR calc Af Amer: 13 mL/min — ABNORMAL LOW (ref >60)
GFR calc non Af Amer: 11 mL/min — ABNORMAL LOW (ref >60)
Glucose, Bld: 96 mg/dL (ref 70–99)
Phosphorus: 3.8 mg/dL (ref 2.5–4.6)
Potassium: 3.8 mmol/L (ref 3.5–5.1)
Sodium: 136 mmol/L (ref 135–145)

## 2019-06-10 LAB — GLUCOSE, CAPILLARY
Glucose-Capillary: 87 mg/dL (ref 70–99)
Glucose-Capillary: 87 mg/dL (ref 70–99)
Glucose-Capillary: 100 mg/dL — ABNORMAL HIGH (ref 70–99)
Glucose-Capillary: 92 mg/dL (ref 70–99)

## 2019-06-10 LAB — CBC
HCT: 26.4 % — ABNORMAL LOW (ref 36.0–46.0)
Hemoglobin: 7.7 g/dL — ABNORMAL LOW (ref 12.0–15.0)
MCH: 25.8 pg — ABNORMAL LOW (ref 26.0–34.0)
MCHC: 29.2 g/dL — ABNORMAL LOW (ref 30.0–36.0)
MCV: 88.6 fL (ref 80.0–100.0)
Platelets: 320 10*3/uL (ref 150–400)
RBC: 2.98 MIL/uL — ABNORMAL LOW (ref 3.87–5.11)
RDW: 16.6 % — ABNORMAL HIGH (ref 11.5–15.5)
WBC: 20.2 10*3/uL — ABNORMAL HIGH (ref 4.0–10.5)
nRBC: 0.2 % (ref 0.0–0.2)

## 2019-06-10 LAB — HEPATITIS B E ANTIGEN: Hep B E Ag: NEGATIVE

## 2019-06-10 LAB — HEPATITIS B SURFACE ANTIBODY, QUANTITATIVE: Hep B S AB Quant (Post): <3.1 m[IU]/mL — ABNORMAL LOW (ref >9.9)

## 2019-06-10 MED ORDER — PENTAFLUOROPROP-TETRAFLUOROETH EX AERO: 1.0000 "application " | INHALATION_SPRAY | CUTANEOUS | Status: DC | PRN

## 2019-06-10 MED ORDER — ALTEPLASE 2 MG IJ SOLR: 2.0000 mg | Freq: Once | INTRAMUSCULAR | Status: DC | PRN

## 2019-06-10 MED ORDER — HEPARIN SODIUM (PORCINE) 1000 UNIT/ML IJ SOLN
INTRAMUSCULAR | Status: AC
  Administered 2019-06-10: 1900 [IU] via INTRAVENOUS_CENTRAL
  Filled 2019-06-10: qty 2

## 2019-06-10 MED ORDER — LIDOCAINE HCL (PF) 1 % IJ SOLN: 5.0000 mL | INTRAMUSCULAR | Status: DC | PRN

## 2019-06-10 MED ORDER — HEPARIN SODIUM (PORCINE) 1000 UNIT/ML DIALYSIS
1000.0000 [IU] | INTRAMUSCULAR | Status: DC | PRN
  Administered 2019-06-10: 11:00:00 3400 [IU] via INTRAVENOUS_CENTRAL

## 2019-06-10 MED ORDER — SODIUM CHLORIDE 0.9 % IV SOLN: 100.0000 mL | INTRAVENOUS | Status: DC | PRN

## 2019-06-10 MED ORDER — HEPARIN SODIUM (PORCINE) 1000 UNIT/ML DIALYSIS
20.0000 [IU]/kg | INTRAMUSCULAR | Status: DC | PRN
  Administered 2019-06-10: 08:00:00 1900 [IU] via INTRAVENOUS_CENTRAL

## 2019-06-10 MED ORDER — HEPARIN SODIUM (PORCINE) 1000 UNIT/ML IJ SOLN
INTRAMUSCULAR | Status: AC
  Administered 2019-06-10: 3400 [IU] via INTRAVENOUS_CENTRAL
  Filled 2019-06-10: qty 4

## 2019-06-10 MED ORDER — LIDOCAINE-PRILOCAINE 2.5-2.5 % EX CREA: 1.0000 "application " | TOPICAL_CREAM | CUTANEOUS | Status: DC | PRN

## 2019-06-10 NOTE — Progress Notes (Signed)
PROGRESS NOTE    Taylor Tyler  TMH:[id number] DOB: 01-18-1941 DOA: 05/30/2019 PCP: McLean-Scocuzza, Nino Glow, MD      Brief Narrative:  Taylor Tyler is a 78 y.o. F with DM, HTN, CKD IV baseline Cr 2.5, anemia, hypothyroidism, and nonhealing lower back wound after surgery who presented with fall and leg pain.  In the ER, Cr >5.  She was noted to have lower extremity edema, which she said has been accumulating for several weeks as well as progressive shortness of breath.   She was admitted for AoCRF, fluid overload and originally diuresed but then started on hemodialysis on 06/08/2019.   Assessment & Plan:  Acute on chronic renal failure Baseline creatinine 2.2-2.5.  Urinalysis bland.  Renal ultrasound unremarkable.  Nephrology consulted.   -Consult Nephrology, appreciate expert cares -Strict I/Os, daily BMP --Tunneled dialysis catheter placed in IJ and AV fistula placed in left arm on 06/08/2019; patient will receive her third round of hemodialysis later today -Case management and social work to help arrange outpatient hemodialysis  Acute on chronic diastolic CHF -Echocardiogram with grade 2 diastolic dysfunction but normal ejection fraction -HD to assist with volume management -Strict I/Os, daily weights, telemetry  -Daily monitoring renal function   Hypothyroidism TSH slightly high, free T4 normal -Continue levothyroxine  Diabetes GLucose well controlled -Continue sliding scale correction insulin -Continue atorvastatin, decrease gabapentin dose as patient is now on HD  Anemia of chronic kidney disease Hemoglobin stable relative to baseline -Continue iron  Hypertension Hypertensive urgency -Continue amlodipine, hydralazine, labetalol -Stopped clonidine given bradycardia worsened     MDM and disposition: The below labs and imaging reports reviewed and summarized above.  Medication management as above. This is a severe exacerbation of chronic disease that has resulted in  the patient being started on hemodialysis.  The patient was admitted with acute on chronic renal failure.  The patient may be discharged once deemed stable by nephrology and outpatient hemodialysis is arranged.   DVT prophylaxis: Heparin Code Status: Full code Family Communication:     Consultants:   Nephrology  Procedures:   11/2 echo  Antimicrobials:       Subjective: No acute complaints today.  She is awaiting her third round of hemodialysis today and denies any acute complaints including shortness of breath or worsening leg swelling.  Objective: Vitals:   06/10/19 1000 06/10/19 1030 06/10/19 1038 06/10/19 1210  BP: (!) 172/68 (!) 156/77 (!) 165/70 (!) 169/54  Pulse: 68 72 70 75  Resp:   16 17  Temp:   98.2 F (36.8 C) 99.9 F (37.7 C)  TempSrc:   Oral Oral  SpO2:   97% 96%  Weight:   90.8 kg   Height:        Intake/Output Summary (Last 24 hours) at 06/10/2019 1557 Last data filed at 06/10/2019 1039 Gross per 24 hour  Intake -  Output 2000 ml  Net -2000 ml   Filed Weights   06/10/19 0628 06/10/19 0730 06/10/19 1038  Weight: 92.6 kg 92.9 kg 90.8 kg    Examination: General appearance: well nourished adult female, in no acute distress.   HEENT: Anicteric, conjunctiva pink, lids and lashes normal. No nasal deformity, discharge, epistaxis.  Lips moist, dentures in place. OP normal, no oral lesions.   Skin: Warm and dry.  No suspicious rashes or lesions.  IJ tunneled catheter in place and +thrill in left upper extremity AV fistula Cardiac: RRR, no murmurs appreciated.  1+ LE edema.    Respiratory:  Normal respiratory rate and rhythm. CTAB without rales or wheezes. Abdomen: Abdomen soft.  No ttp or guarding. No ascites, distension, hepatosplenomegaly.   MSK: No deformities or effusions of the large joints of the upper or lower extremities bilaterally. Neuro: Awake and alert. Moves all extremities equally and with normal coordination.  Marland Kitchen Speech fluent.    Psych:  Sensorium intact and responding to questions, attention normal. Affect normal.  Judgment and insight appear    LOS: 11 days    Time spent: 25 minutes   Charolotte Capuchin, MD [facility] 06/10/2019, 3:57 PM   Please page through [place]:  [email or web address] Password [email or web address] If 7PM-7AM, please contact night-coverage

## 2019-06-10 NOTE — TOC Transition Note (Addendum)
Transition of Care [facility]) - CM/SW Discharge Note   Patient Details  Name: Taylor Tyler MRN: [id number] Date of Birth: 05/29/1941  Transition of Care [facility]) CM/SW Contact:  Zenon Mayo, RN Phone Number: 06/10/2019, 3:53 PM   Clinical Narrative:    Patient is set up with [facility] for [facility], [place] states he can add the [facility]   HHRN is not able to do soc until Monday.   Final next level of care: [place] Barriers to Discharge: No Barriers Identified   Patient Goals and CMS Choice Patient states their goals for this hospitalization and ongoing recovery are:: get better CMS Medicare.gov Compare Post Acute Care list provided to:: Patient Choice offered to / list presented to : Patient, Adult Children  Discharge Placement                       Discharge Plan and Services In-house Referral: NA Discharge Planning Services: CM Consult Post Acute Care Choice: NA          DME Arranged: (NA)         HH Arranged: RN, PT [place] Agency: [place] Date [facility] Agency Contacted: 06/10/19 Time [place]: [id number] Representative spoke with at [place]: cory  Social Determinants of Health ([place]) Interventions     Readmission Risk Interventions No flowsheet data found.

## 2019-06-10 NOTE — Progress Notes (Signed)
PT Cancellation Note  Patient Details Name: Taylor Tyler MRN: [id number] DOB: September 23, 1940   Cancelled Treatment:    Reason Eval/Treat Not Completed: Patient at procedure or test/unavailable Pt off floor at HD. Will follow.   Marguarite Arbour A Timeka Goette 06/10/2019, 11:04 AM Marisa Severin, PT, DPT Acute Rehabilitation Services Pager [phone] Office [phone]

## 2019-06-10 NOTE — Progress Notes (Signed)
Pt. In HD now, will check back for skilled OT session as schedule allows in conjunction with pt. Availability.    Romana Juniper, COTA/L

## 2019-06-10 NOTE — Progress Notes (Signed)
[facility] PROGRESS NOTE  Assessment/ Plan: Pt is a 78 y.o. yo female  W/ hx of DM, HTN, CHF, anemia, HLD, CKD4, admitted for CHF exacerbation, nephrology consulted for AKI on CKD.   # AKI on CKD 4, non oliguric - Baseline cr~3. AKI likely 2/2 CHF exacerbation. Renal u/s consistent with CKD, no hydronephrosis. UA with WBC and proteinuria, no RBC.Cr trending towards baseline.  Tunneled HD cath placed and AV fistula created, pt has received 2 sessions HD so far, third session planned for today.   - need arrangement for outpatient HD   # metabolic acidosis: improved s/p HD. Continue to monitor.   # anemia 2/2 CKD: iron saturation 17%, currently on IV iron. Continue ESA. Monitor CBC  # Secondary hyperparathyroidism: PTH level 99. Ph levels have improved with HD.  Monitor phosphorus level.     # HTN/volume -Poorly controlled, currently on 3 medications. Managed per primary team.    Subjective:  Patient seen while getting dialysis.  She does not have any complaints today.  Still endorses pain above her ankles only when palpated.   Objective General:NAD, comfortable. Currently receiving dialysis.   Heart:RRR, s1s2 nl  Lungs:clear b/l, no crackles Abdomen: nontender to palpation. Normal bowel sounds.  Extremities:some TTP above the ankles bilaterally with trace edema b/l   Vital signs in last 24 hours: Vitals:   06/09/19 1500 06/09/19 2036 06/10/19 0545 06/10/19 0628  BP: (!) 164/78 (!) 185/60 (!) 184/54   Pulse: 73 74 75   Resp: 20 20 18    Temp: 98.3 F (36.8 C) 99.6 F (37.6 C) 98.6 F (37 C)   TempSrc: Oral Oral Oral   SpO2: 100% 97% 97%   Weight: 102.8 kg   92.6 kg  Height:       Weight change: 5.084 kg  Intake/Output Summary (Last 24 hours) at 06/10/2019 0721 Last data filed at 06/09/2019 1500 Gross per 24 hour  Intake -  Output 1500 ml  Net -1500 ml    Labs: Basic Metabolic Panel: Recent Labs  Lab 06/08/19 0327 06/08/19 1903  06/09/19 0419  NA 136 136 137  K 4.4 4.2 3.8  CL 104 104 101  CO2 18* 17* 22  GLUCOSE 89 154* 97  BUN 93* 97* 47*  CREATININE 6.21* 6.25* 3.84*  CALCIUM 8.4* 8.5* 8.3*  PHOS 6.3* 7.3* 4.7*   Liver Function Tests: Recent Labs  Lab 06/05/19 0527 06/06/19 0634  06/08/19 0327 06/08/19 1903 06/09/19 0419  AST 21 18  --   --   --   --   ALT 18 16  --   --   --   --   ALKPHOS 41 45  --   --   --   --   BILITOT 0.4 0.4  --   --   --   --   PROT 6.5 6.3*  --   --   --   --   ALBUMIN 1.7* 1.6*   < > 1.6* 1.7* 1.8*   < > = values in this interval not displayed.   No results for input(s): LIPASE, AMYLASE in the last 168 hours. No results for input(s): AMMONIA in the last 168 hours. CBC: Recent Labs  Lab 06/06/19 0634 06/07/19 0345 06/08/19 0327 06/08/19 1903 06/09/19 0419  WBC 16.1* 18.1* 19.3* 18.6* 16.9*  HGB 7.7* 7.7* 7.5* 8.1* 8.1*  HCT 24.6* 25.4* 24.2* 26.3* 26.4*  MCV 83.1 84.9 84.3 84.6 84.1  PLT 351 385 389 370 363  Cardiac Enzymes: No results for input(s): CKTOTAL, CKMB, CKMBINDEX, TROPONINI in the last 168 hours. CBG: Recent Labs  Lab 06/09/19 0808 06/09/19 1145 06/09/19 1616 06/09/19 2115 06/10/19 0631  GLUCAP 97 152* 111* 90 87    Iron Studies: No results for input(s): IRON, TIBC, TRANSFERRIN, FERRITIN in the last 72 hours. Studies/Results: Dg Fluoro Guide Cv Line-no Report  Result Date: 06/08/2019 Fluoroscopy was utilized by the requesting physician.  No radiographic interpretation.    Medications: Infusions: . sodium chloride 10 mL/hr at 06/08/19 1235  . sodium chloride    . sodium chloride    . ferric gluconate (FERRLECIT/NULECIT) IV 125 mg (06/09/19 1445)    Scheduled Medications: . amLODipine  10 mg Oral Daily  . atorvastatin  40 mg Oral q1800  . Chlorhexidine Gluconate Cloth  6 each Topical Q0600  . darbepoetin (ARANESP) injection - NON-DIALYSIS  150 mcg Subcutaneous Q Sat-1800  . ferrous sulfate  325 mg Oral Q breakfast  .  gabapentin  200 mg Oral QHS  . heparin      . heparin  5,000 Units Subcutaneous Q8H  . hydrALAZINE  50 mg Oral Q8H  . insulin aspart  0-9 Units Subcutaneous TID WC  . labetalol  200 mg Oral BID  . levothyroxine  100 mcg Oral QAC breakfast    have reviewed scheduled and prn medications.   [name] 06/10/2019,7:21 AM  LOS: 11 days

## 2019-06-10 NOTE — Progress Notes (Signed)
Renal Navigator met with patient in her room to complete assessment for OP HD referral. Patient seemed much more alert today than yesterday, however, still appeared confused by everything Navigator discussed related to OP HD treatment. She was overwhelmed by information that her seat time could be early in the morning, that her treatments would be 4 hours long and that she would need to go 3 times per week. She replied, "Oh no, I'd rather just stop." Renal Navigator asked patient how she felt about this comment. She states she has, "a lot to think about." We discussed the fact that she does not have to pursue HD if she does not want to and that it is a big undertaking. She agrees that she has lived a full life and that this may not be how she wants to end it. She concluded that she wants Renal Navigator to continue with OP HD referral, but that she needs to think more about this decision. She declined recommendation to meet with PMT. Renal Navigator notified Dr. Carolin Sicks of the conversation and asked him to follow up with patient over the weekend to answer any questions she may have, as she initially agreed to HD, but may possibly be having second thoughts.  Alphonzo Cruise,  Renal Navigator [phone]

## 2019-06-10 NOTE — Progress Notes (Signed)
Physical Therapy Treatment Patient Details Name: Taylor Tyler MRN: [id number] DOB: 1940-11-02 Today's Date: 06/10/2019    History of Present Illness Pt is a 78 y.o. female admitted 05/30/19 after fall at home, laying on the floor at least 8 hours until found by son, also with worsening SOB and LE edema. VQ scan with low probability for PE. Worked up for HF. Other PMH includes CKD III, DM2, DJD s/p lumbar sx (2018), arthritis.    PT Comments    Patient recently returned from dialysis, in good spirits. Pt noted to have some confusion asking when she was going to dialysis despite having just returned. Requires Mod A for standing from EOB with cues for hand placement/technique. Tolerated short distance ambulation with Min guard assist for balance/safety. Pt with no SOB or dizziness today but does report being hungry and tired. Have some concerns about this pt returning home alone due to some cognitive deficits relating to attention, memory, awareness, safety. Recommend initial 24/7 supervision for safety. Will follow.    Follow Up Recommendations  Home health PT;Supervision for mobility/OOB     Equipment Recommendations  None recommended by PT    Recommendations for Other Services       Precautions / Restrictions Precautions Precautions: Fall Restrictions Weight Bearing Restrictions: No    Mobility  Bed Mobility Overal bed mobility: Needs Assistance Bed Mobility: Rolling;Sidelying to Sit Rolling: Supervision Sidelying to sit: Min guard;HOB elevated       General bed mobility comments: increased time, HOB up, use of rail  Transfers Overall transfer level: Needs assistance Equipment used: Rolling walker (2 wheeled) Transfers: Sit to/from Stand Sit to Stand: Mod assist;From elevated surface         General transfer comment: Assist to power to standing, cues for hand placement.  Ambulation/Gait Ambulation/Gait assistance: Min guard Gait Distance (Feet): 24 Feet Assistive  device: Rolling walker (2 wheeled) Gait Pattern/deviations: Step-through pattern;Decreased stride length Gait velocity: Decreased   General Gait Details: Slow, mildly unsteady gait with RW for support. No DOE noted. No dizziness. Reports being hungry.   Stairs             Wheelchair Mobility    Modified Rankin (Stroke Patients Only)       Balance Overall balance assessment: Needs assistance Sitting-balance support: Feet supported;No upper extremity supported Sitting balance-Leahy Scale: Good Sitting balance - Comments: Some tremoring noted in RUE with weight bearing.   Standing balance support: During functional activity Standing balance-Leahy Scale: Poor Standing balance comment: Requires BUE support in standing.                            Cognition Arousal/Alertness: Awake/alert Behavior During Therapy: WFL for tasks assessed/performed Overall Cognitive Status: No family/caregiver present to determine baseline cognitive functioning Area of Impairment: Memory;Attention;Problem solving;Orientation                 Orientation Level: Disoriented to;Time;Situation Current Attention Level: Sustained Memory: Decreased short-term memory       Problem Solving: Slow processing;Requires verbal cues General Comments: "I guess I am going home now." Pt asking about going to dialysis even though she just got back. Not able to state Month or year. Difficulty communicating needs today, when ordering her lunch and asking what she wants to drink, pt states "peaches. oh wait that is not to drink." asked again and stated "peaches"      Exercises General Exercises - Lower Extremity Ankle Circles/Pumps: Both;10  reps;Seated Long Arc Quad: Both;15 reps;Seated;Strengthening Hip ABduction/ADduction: Both;15 reps;Seated;Strengthening    General Comments General comments (skin integrity, edema, etc.): VSS.      Pertinent Vitals/Pain Pain Assessment: Faces Faces  Pain Scale: No hurt    Home Living                      Prior Function            PT Goals (current goals can now be found in the care plan section) Progress towards PT goals: Progressing toward goals    Frequency    Min 3X/week      PT Plan Current plan remains appropriate    Co-evaluation              AM-PAC PT "6 Clicks" Mobility   Outcome Measure  Help needed turning from your back to your side while in a flat bed without using bedrails?: A Little Help needed moving from lying on your back to sitting on the side of a flat bed without using bedrails?: A Little Help needed moving to and from a bed to a chair (including a wheelchair)?: A Lot Help needed standing up from a chair using your arms (e.g., wheelchair or bedside chair)?: A Lot Help needed to walk in hospital room?: A Little Help needed climbing 3-5 steps with a railing? : A Little 6 Click Score: 16    End of Session Equipment Utilized During Treatment: Gait belt Activity Tolerance: Patient tolerated treatment well Patient left: in chair;with call bell/phone within reach;with chair alarm set Nurse Communication: Mobility status PT Visit Diagnosis: Other abnormalities of gait and mobility (R26.89)     Time: 1140-1200 PT Time Calculation (min) (ACUTE ONLY): 20 min  Charges:  $Therapeutic Activity: 8-22 mins                     Taylor Tyler, PT, DPT Acute Rehabilitation Services Pager [phone] Office [phone]       Taylor Tyler 06/10/2019, 12:22 PM

## 2019-06-11 DIAGNOSIS — N170 Acute kidney failure with tubular necrosis: Secondary | ICD-10-CM

## 2019-06-11 LAB — GLUCOSE, CAPILLARY
Glucose-Capillary: 83 mg/dL (ref 70–99)
Glucose-Capillary: 88 mg/dL (ref 70–99)
Glucose-Capillary: 90 mg/dL (ref 70–99)
Glucose-Capillary: 103 mg/dL — ABNORMAL HIGH (ref 70–99)

## 2019-06-11 NOTE — Progress Notes (Signed)
[name] PROGRESS NOTE  Assessment/ Plan: Pt is a 78 y.o. yo female  with history of DM, HTN, CHF, anemia, HLD,CKD 4 admitted with CHF exacerbation, consulted for AKI on CKD.  #AKI on CKD stage IV, nonoliguric now progressed to ESRD:  Baseline creatinine seems to be around3. AKI likely due to CHF exacerbation. UA with WBC and proteinuria, no RBC. Kidney ultrasound with stable echogenic kidney and mildly atrophy consistent with CKD. No hydronephrosis.  -Started dialysis on 06/08/2019.  Placement of right IJ TDC and left brachiocephalic AV fistula creation by Dr. Donnetta Hutching on 11/4. -Received 3 dialysis treatment so far and tolerating well.  Uremic symptoms improving.  I have discussed with the patient about continuing dialysis 3 times a week and arrangement of outpatient HD center.  She understood clearly and agreed with the plan.  Social worker evaluation ongoing for outpatient HD set up.  Next HD on Monday.  #Metabolic acidosis: dc sodium bicarbonate, on HD  # Anemia due to CKD: Iron saturation 17%. Continue IV iron. Continue ESA. Monitor CBC.  # Secondary hyperparathyroidism: PTH level99. Phosphorus level improved with HD.    # HTN/volume: Lower extremity edema. Ultrafiltration during HD. Discontinue Lasix.  Subjective: Seen and examined bedside.  She denied nausea vomiting chest pain shortness of breath headache or dizziness.  Discussed about dialysis in outpatient center.  She verbalized understanding and appreciative. Objective Vital signs in last 24 hours: Vitals:   06/10/19 1210 06/10/19 2050 06/11/19 0628 06/11/19 0631  BP: (!) 169/54 (!) 168/57  (!) 186/52  Pulse: 75 74  74  Resp: 17 18  18   Temp: 99.9 F (37.7 C) 99.1 F (37.3 C)  99.4 F (37.4 C)  TempSrc: Oral Oral  Oral  SpO2: 96% 99%  96%  Weight:   90 kg   Height:       Weight change: -5.984 kg  Intake/Output Summary (Last 24 hours) at 06/11/2019 0856 Last data filed at  06/11/2019 0631 Gross per 24 hour  Intake -  Output 2100 ml  Net -2100 ml       Labs: Basic Metabolic Panel: Recent Labs  Lab 06/08/19 1903 06/09/19 0419 06/10/19 0809  NA 136 137 136  K 4.2 3.8 3.8  CL 104 101 99  CO2 17* 22 24  GLUCOSE 154* 97 96  BUN 97* 47* 31*  CREATININE 6.25* 3.84* 3.66*  CALCIUM 8.5* 8.3* 8.3*  PHOS 7.3* 4.7* 3.8   Liver Function Tests: Recent Labs  Lab 06/05/19 0527 06/06/19 0634  06/08/19 1903 06/09/19 0419 06/10/19 0809  AST 21 18  --   --   --   --   ALT 18 16  --   --   --   --   ALKPHOS 41 45  --   --   --   --   BILITOT 0.4 0.4  --   --   --   --   PROT 6.5 6.3*  --   --   --   --   ALBUMIN 1.7* 1.6*   < > 1.7* 1.8* 1.8*   < > = values in this interval not displayed.   No results for input(s): LIPASE, AMYLASE in the last 168 hours. No results for input(s): AMMONIA in the last 168 hours. CBC: Recent Labs  Lab 06/07/19 0345 06/08/19 0327 06/08/19 1903 06/09/19 0419 06/10/19 0809  WBC 18.1* 19.3* 18.6* 16.9* 20.2*  HGB 7.7* 7.5* 8.1* 8.1* 7.7*  HCT 25.4* 24.2* 26.3* 26.4*  26.4*  MCV 84.9 84.3 84.6 84.1 88.6  PLT 385 389 370 363 320   Cardiac Enzymes: No results for input(s): CKTOTAL, CKMB, CKMBINDEX, TROPONINI in the last 168 hours. CBG: Recent Labs  Lab 06/10/19 0631 06/10/19 1129 06/10/19 1636 06/10/19 2122 06/11/19 0623  GLUCAP 87 87 100* 92 83    Iron Studies: No results for input(s): IRON, TIBC, TRANSFERRIN, FERRITIN in the last 72 hours. Studies/Results: No results found.  Medications: Infusions: . sodium chloride 10 mL/hr at 06/08/19 1235    Scheduled Medications: . amLODipine  10 mg Oral Daily  . atorvastatin  40 mg Oral q1800  . Chlorhexidine Gluconate Cloth  6 each Topical Q0600  . darbepoetin (ARANESP) injection - NON-DIALYSIS  150 mcg Subcutaneous Q Sat-1800  . gabapentin  200 mg Oral QHS  . heparin  5,000 Units Subcutaneous Q8H  . hydrALAZINE  50 mg Oral Q8H  . insulin aspart  0-9 Units  Subcutaneous TID WC  . labetalol  200 mg Oral BID  . levothyroxine  100 mcg Oral QAC breakfast    have reviewed scheduled and prn medications.  Physical Exam: General:NAD, comfortable Heart:RRR, s1s2 nl Lungs:clear b/l, no crackle Abdomen:soft, Non-tender, non-distended Extremities: Trace LE edema Dialysis Access: Right IJ TDC and left aVF has good thrill and bruit.  Taylor Tyler 06/11/2019,8:56 AM  LOS: 12 days  Pager: [phone]

## 2019-06-11 NOTE — Progress Notes (Addendum)
PROGRESS NOTE  Taylor Tyler VXB:[id number] DOB: 1940-09-19 DOA: 05/30/2019 PCP: McLean-Scocuzza, Nino Glow, MD  HPI/Recap of past 24 hours: Patient seen and examined at bedside reports no complaints or adverse event overnight  Assessment/Plan: Principal Problem:   Acute CHF (congestive heart failure) ([place]) Active Problems:   Hypothyroidism   DM (diabetes mellitus), type 2 with renal complications (HCC)   Anemia   Acute renal failure superimposed on stage 3 chronic kidney disease ([place])   Essential hypertension   Pressure injury of skin  #1 acute on chronic renal failure.  She is status post tunneled dialysis catheter Case management and social worker helping to arrange outpatient hemodialysis she had her third hemodialysis yesterday which was June 10, 2019.  2.  Acute on chronic diastolic CHF continue hemodialysis which will help with fluid management strict INO  3.  Hypothyroidism continue levothyroxine labs are normal  4 type 2 diabetes mellitus continue sliding scale  5 anemia of chronic disease likely secondary to chronic kidney disease hemoglobin is stable and at baseline continue iron therapy  6 hypertension uncontrolled continue amlodipine hydralazine and labetalol  7 hyperlipidemia continue atorvastatin   Code Status: Full  Severity of Illness: The appropriate patient status for this patient is INPATIENT. Inpatient status is judged to be reasonable and necessary in order to provide the required intensity of service to ensure the patient's safety. The patient's presenting symptoms, physical exam findings, and initial radiographic and laboratory data in the context of their chronic comorbidities is felt to place them at high risk for further clinical deterioration. Furthermore, it is not anticipated that the patient will be medically stable for discharge from the hospital within 2 midnights of admission. The following factors support the patient status of inpatient.     " The worrisome physical exam findings include hemodialysis. " The initial radiographic and laboratory data are worrisome because of hemodialysis. " The chronic co-morbidities include congestive heart failure chronic kidney disease end-stage renal disease diabetes hypothyroidism anemia of chronic disease.   * I certify that at the point of admission it is my clinical judgment that the patient will require inpatient hospital care spanning beyond 2 midnights from the point of admission due to high intensity of service, high risk for further deterioration and high frequency of surveillance required.*    Family Communication: None at bedside  Disposition Plan: Home when stable patient is waiting for hemodialysis chair   Consultants:  Nephrology  Procedures:  Hemodialysis  Antimicrobials:  None  DVT prophylaxis: Heparin   Objective: Vitals:   06/10/19 1210 06/10/19 2050 06/11/19 0628 06/11/19 0631  BP: (!) 169/54 (!) 168/57  (!) 186/52  Pulse: 75 74  74  Resp: 17 18  18   Temp: 99.9 F (37.7 C) 99.1 F (37.3 C)  99.4 F (37.4 C)  TempSrc: Oral Oral  Oral  SpO2: 96% 99%  96%  Weight:   90 kg   Height:        Intake/Output Summary (Last 24 hours) at 06/11/2019 0857 Last data filed at 06/11/2019 0631 Gross per 24 hour  Intake --  Output 2100 ml  Net -2100 ml   Filed Weights   06/10/19 0730 06/10/19 1038 06/11/19 0628  Weight: 92.9 kg 90.8 kg 90 kg   Body mass index is 35.15 kg/m.  Exam:   General: 78 y.o. year-old female well developed well nourished in no acute distress.  Alert and oriented x3.  Cardiovascular: Regular rate and rhythm with no rubs or gallops.  No thyromegaly or JVD noted.    Respiratory: Clear to auscultation with no wheezes or rales. Good inspiratory effort.  Abdomen: Soft nontender nondistended with normal bowel sounds x4 quadrants.  Musculoskeletal: Positive lower extremity edema. 2/4 pulses in all 4 extremities.  Skin: No  ulcerative lesions noted or rashes,  Psychiatry: Mood is appropriate for condition and setting    Data Reviewed: CBC: Recent Labs  Lab 06/07/19 0345 06/08/19 0327 06/08/19 1903 06/09/19 0419 06/10/19 0809  WBC 18.1* 19.3* 18.6* 16.9* 20.2*  HGB 7.7* 7.5* 8.1* 8.1* 7.7*  HCT 25.4* 24.2* 26.3* 26.4* 26.4*  MCV 84.9 84.3 84.6 84.1 88.6  PLT 385 389 370 363 546   Basic Metabolic Panel: Recent Labs  Lab 06/05/19 0527 06/06/19 0634 06/07/19 0345 06/08/19 0327 06/08/19 1903 06/09/19 0419 06/10/19 0809  NA 134* 133* 133* 136 136 137 136  K 4.6 4.5 4.5 4.4 4.2 3.8 3.8  CL 105 103 104 104 104 101 99  CO2 17* 17* 17* 18* 17* 22 24  GLUCOSE 93 94 96 89 154* 97 96  BUN 72* 81* 88* 93* 97* 47* 31*  CREATININE 5.91* 5.86* 6.01* 6.21* 6.25* 3.84* 3.66*  CALCIUM 8.5* 8.2* 8.1* 8.4* 8.5* 8.3* 8.3*  MG 1.7 1.7 1.6* 1.6*  --  1.7  --   PHOS  --   --  6.2* 6.3* 7.3* 4.7* 3.8   GFR: Estimated Creatinine Clearance: 13.9 mL/min (A) (by C-G formula based on SCr of 3.66 mg/dL (H)). Liver Function Tests: Recent Labs  Lab 06/05/19 0527 06/06/19 0634 06/07/19 0345 06/08/19 0327 06/08/19 1903 06/09/19 0419 06/10/19 0809  AST 21 18  --   --   --   --   --   ALT 18 16  --   --   --   --   --   ALKPHOS 41 45  --   --   --   --   --   BILITOT 0.4 0.4  --   --   --   --   --   PROT 6.5 6.3*  --   --   --   --   --   ALBUMIN 1.7* 1.6* 1.6* 1.6* 1.7* 1.8* 1.8*   No results for input(s): LIPASE, AMYLASE in the last 168 hours. No results for input(s): AMMONIA in the last 168 hours. Coagulation Profile: Recent Labs  Lab 06/08/19 0327  INR 1.3*   Cardiac Enzymes: No results for input(s): CKTOTAL, CKMB, CKMBINDEX, TROPONINI in the last 168 hours. BNP (last 3 results) No results for input(s): PROBNP in the last 8760 hours. HbA1C: No results for input(s): HGBA1C in the last 72 hours. CBG: Recent Labs  Lab 06/10/19 0631 06/10/19 1129 06/10/19 1636 06/10/19 2122 06/11/19 0623   GLUCAP 87 87 100* 92 83   Lipid Profile: No results for input(s): CHOL, HDL, LDLCALC, TRIG, CHOLHDL, LDLDIRECT in the last 72 hours. Thyroid Function Tests: No results for input(s): TSH, T4TOTAL, FREET4, T3FREE, THYROIDAB in the last 72 hours. Anemia Panel: No results for input(s): VITAMINB12, FOLATE, FERRITIN, TIBC, IRON, RETICCTPCT in the last 72 hours. Urine analysis:    Component Value Date/Time   COLORURINE YELLOW 05/31/2019 0029   APPEARANCEUR CLEAR 05/31/2019 0029   APPEARANCEUR Cloudy 12/14/2013 1727   LABSPEC 1.020 05/31/2019 0029   LABSPEC 1.012 12/14/2013 1727   PHURINE 6.0 05/31/2019 0029   GLUCOSEU NEGATIVE 05/31/2019 0029   GLUCOSEU NEGATIVE 07/21/2017 1001   HGBUR SMALL (A) 05/31/2019 0029   BILIRUBINUR NEGATIVE  05/31/2019 0029   BILIRUBINUR Negative 12/14/2013 1727   KETONESUR 15 (A) 05/31/2019 0029   PROTEINUR >300 (A) 05/31/2019 0029   UROBILINOGEN 0.2 07/21/2017 1001   NITRITE NEGATIVE 05/31/2019 0029   LEUKOCYTESUR NEGATIVE 05/31/2019 0029   LEUKOCYTESUR 3+ 12/14/2013 1727   Sepsis Labs: @LABRCNTIP (procalcitonin:4,lacticidven:4)  ) Recent Results (from the past 240 hour(s))  SARS CORONAVIRUS 2 (TAT 6-24 HRS) Nasopharyngeal Nasopharyngeal Swab     Status: None   Collection Time: 06/08/19  2:06 AM   Specimen: Nasopharyngeal Swab  Result Value Ref Range Status   SARS Coronavirus 2 NEGATIVE NEGATIVE Final    Comment: (NOTE) SARS-CoV-2 target nucleic acids are NOT DETECTED. The SARS-CoV-2 RNA is generally detectable in upper and lower respiratory specimens during the acute phase of infection. Negative results do not preclude SARS-CoV-2 infection, do not rule out co-infections with other pathogens, and should not be used as the sole basis for treatment or other patient management decisions. Negative results must be combined with clinical observations, patient history, and epidemiological information. The expected result is Negative. Fact Sheet for  Patients: SugarRoll.be Fact Sheet for Healthcare Providers: https://www.woods-mathews.com/ This test is not yet approved or cleared by the [place] FDA and  has been authorized for detection and/or diagnosis of SARS-CoV-2 by FDA under an Emergency Use Authorization (EUA). This EUA will remain  in effect (meaning this test can be used) for the duration of the COVID-19 declaration under Section 56 4(b)(1) of the Act, 21 U.S.C. section 360bbb-3(b)(1), unless the authorization is terminated or revoked sooner. Performed at [facility], [place] [address] [place], [place] [address]   Surgical PCR screen     Status: None   Collection Time: 06/08/19  2:10 AM   Specimen: Nasal Mucosa; Nasal Swab  Result Value Ref Range Status   MRSA, PCR NEGATIVE NEGATIVE Final   Staphylococcus aureus NEGATIVE NEGATIVE Final    Comment: (NOTE) The Xpert SA Assay (FDA approved for NASAL specimens in patients 77 years of age and older), is one component of a comprehensive surveillance program. It is not intended to diagnose infection nor to guide or monitor treatment. Performed at [facility], [place] [address] [place], [place] [address]       Studies: No results found.  Scheduled Meds:  amLODipine  10 mg Oral Daily   atorvastatin  40 mg Oral q1800   Chlorhexidine Gluconate Cloth  6 each Topical Q0600   darbepoetin (ARANESP) injection - NON-DIALYSIS  150 mcg Subcutaneous Q Sat-1800   gabapentin  200 mg Oral QHS   heparin  5,000 Units Subcutaneous Q8H   hydrALAZINE  50 mg Oral Q8H   insulin aspart  0-9 Units Subcutaneous TID WC   labetalol  200 mg Oral BID   levothyroxine  100 mcg Oral QAC breakfast    Continuous Infusions:  sodium chloride 10 mL/hr at 06/08/19 1235     LOS: 12 days     Cristal Deer, MD [facility]  To reach me or the doctor on call, go to: [email or web address] Password [facility]  06/11/2019, 8:57 AM

## 2019-06-12 DIAGNOSIS — E02 Subclinical iodine-deficiency hypothyroidism: Secondary | ICD-10-CM

## 2019-06-12 DIAGNOSIS — L89000 Pressure ulcer of unspecified elbow, unstageable: Secondary | ICD-10-CM

## 2019-06-12 LAB — GLUCOSE, CAPILLARY
Glucose-Capillary: 100 mg/dL — ABNORMAL HIGH (ref 70–99)
Glucose-Capillary: 114 mg/dL — ABNORMAL HIGH (ref 70–99)
Glucose-Capillary: 114 mg/dL — ABNORMAL HIGH (ref 70–99)
Glucose-Capillary: 105 mg/dL — ABNORMAL HIGH (ref 70–99)

## 2019-06-12 MED ORDER — HYDRALAZINE HCL 50 MG PO TABS
100.0000 mg | ORAL_TABLET | Freq: Three times a day (TID) | ORAL | Status: DC
  Administered 2019-06-12 – 2019-07-02 (×34): 100 mg via ORAL
  Filled 2019-06-12 (×42): qty 2

## 2019-06-12 MED ORDER — CHLORHEXIDINE GLUCONATE CLOTH 2 % EX PADS
6.0000 | MEDICATED_PAD | Freq: Every day | CUTANEOUS | Status: DC
  Administered 2019-06-13 – 2019-06-30 (×15): 6 via TOPICAL

## 2019-06-12 NOTE — Progress Notes (Signed)
PROGRESS NOTE  Taylor Tyler OZD:[id number] DOB: [birth year] DOA: 05/30/2019 PCP: McLean-Scocuzza, Nino Glow, MD  HPI/Recap of past 24 hours: Patient seen and examined at bedside reports no complaints or adverse event overnight.  Subjective June 12, 2019:   Patient seen and examined at bedside denies any new complaints.  She is aware that she is waiting for dialysis chair for hemodialysis as outpatient   Assessment/Plan: Principal Problem:   Acute CHF (congestive heart failure) (HCC) Active Problems:   Hypothyroidism   DM (diabetes mellitus), type 2 with renal complications (HCC)   Anemia   Acute renal failure superimposed on stage 3 chronic kidney disease (HCC)   Essential hypertension   Pressure injury of skin  #1 acute on chronic renal failure.  She is status post tunneled dialysis catheter Case management and social worker helping to arrange outpatient hemodialysis she had her third hemodialysis yesterday which was June 10, 2019.  Her next hemodialysis on Monday  2.  Acute on chronic diastolic CHF continue hemodialysis which will help with fluid management.  Strict I/O  3.  Hypothyroidism continue levothyroxine labs are normal  4 type 2 diabetes mellitus continue sliding scale  5 anemia of chronic disease likely secondary to chronic kidney disease hemoglobin is stable and at baseline continue iron therapy  6 hypertension uncontrolled continue amlodipine hydralazine and labetalol  7 hyperlipidemia continue atorvastatin   Code Status: Full  Severity of Illness: The appropriate patient status for this patient is INPATIENT. Inpatient status is judged to be reasonable and necessary in order to provide the required intensity of service to ensure the patient's safety. The patient's presenting symptoms, physical exam findings, and initial radiographic and laboratory data in the context of their chronic comorbidities is felt to place them at high risk for further clinical  deterioration. Furthermore, it is not anticipated that the patient will be medically stable for discharge from the hospital within 2 midnights of admission. The following factors support the patient status of inpatient.    " The worrisome physical exam findings include hemodialysis. " The initial radiographic and laboratory data are worrisome because of hemodialysis. " The chronic co-morbidities include congestive heart failure chronic kidney disease end-stage renal disease diabetes hypothyroidism anemia of chronic disease.   * I certify that at the point of admission it is my clinical judgment that the patient will require inpatient hospital care spanning beyond 2 midnights from the point of admission due to high intensity of service, high risk for further deterioration and high frequency of surveillance required.*    Family Communication: None at bedside  Disposition Plan: Home when stable patient is waiting for hemodialysis chair   Consultants:  Nephrology  Procedures:  Hemodialysis  Antimicrobials:  None  DVT prophylaxis: Heparin   Objective: Vitals:   06/12/19 0636 06/12/19 0958 06/12/19 1416 06/12/19 1504  BP: (!) 162/51 (!) 179/49 (!) 157/45 (!) 158/54  Pulse: 73 72 72 70  Resp: 18 14 20    Temp: 98.8 F (37.1 C)  98.6 F (37 C)   TempSrc: Oral  Oral   SpO2: 98% 99% 97%   Weight: 90.3 kg     Height:        Intake/Output Summary (Last 24 hours) at 06/12/2019 1734 Last data filed at 06/12/2019 1608 Gross per 24 hour  Intake 840 ml  Output 175 ml  Net 665 ml   Filed Weights   06/10/19 1038 06/11/19 0628 06/12/19 0636  Weight: 90.8 kg 90 kg 90.3 kg  Body mass index is 35.27 kg/m.  Exam:  . General: 78 y.o. year-old female well developed well nourished in no acute distress.  Alert and oriented x3. . Cardiovascular: Regular rate and rhythm with no rubs or gallops.  No thyromegaly or JVD noted.   Marland Kitchen Respiratory: Clear to auscultation with no wheezes or  rales. Good inspiratory effort. . Abdomen: Soft nontender nondistended with normal bowel sounds x4 quadrants. . Musculoskeletal: Positive lower extremity edema. 2/4 pulses in all 4 extremities. . Skin: No ulcerative lesions noted or rashes, . Psychiatry: Mood is appropriate for condition and setting    Data Reviewed: CBC: Recent Labs  Lab 06/07/19 0345 06/08/19 0327 06/08/19 1903 06/09/19 0419 06/10/19 0809  WBC 18.1* 19.3* 18.6* 16.9* 20.2*  HGB 7.7* 7.5* 8.1* 8.1* 7.7*  HCT 25.4* 24.2* 26.3* 26.4* 26.4*  MCV 84.9 84.3 84.6 84.1 88.6  PLT 385 389 370 363 580   Basic Metabolic Panel: Recent Labs  Lab 06/06/19 0634 06/07/19 0345 06/08/19 0327 06/08/19 1903 06/09/19 0419 06/10/19 0809  NA 133* 133* 136 136 137 136  K 4.5 4.5 4.4 4.2 3.8 3.8  CL 103 104 104 104 101 99  CO2 17* 17* 18* 17* 22 24  GLUCOSE 94 96 89 154* 97 96  BUN 81* 88* 93* 97* 47* 31*  CREATININE 5.86* 6.01* 6.21* 6.25* 3.84* 3.66*  CALCIUM 8.2* 8.1* 8.4* 8.5* 8.3* 8.3*  MG 1.7 1.6* 1.6*  --  1.7  --   PHOS  --  6.2* 6.3* 7.3* 4.7* 3.8   GFR: Estimated Creatinine Clearance: 14 mL/min (A) (by C-G formula based on SCr of 3.66 mg/dL (H)). Liver Function Tests: Recent Labs  Lab 06/06/19 0634 06/07/19 0345 06/08/19 0327 06/08/19 1903 06/09/19 0419 06/10/19 0809  AST 18  --   --   --   --   --   ALT 16  --   --   --   --   --   ALKPHOS 45  --   --   --   --   --   BILITOT 0.4  --   --   --   --   --   PROT 6.3*  --   --   --   --   --   ALBUMIN 1.6* 1.6* 1.6* 1.7* 1.8* 1.8*   No results for input(s): LIPASE, AMYLASE in the last 168 hours. No results for input(s): AMMONIA in the last 168 hours. Coagulation Profile: Recent Labs  Lab 06/08/19 0327  INR 1.3*   Cardiac Enzymes: No results for input(s): CKTOTAL, CKMB, CKMBINDEX, TROPONINI in the last 168 hours. BNP (last 3 results) No results for input(s): PROBNP in the last 8760 hours. HbA1C: No results for input(s): HGBA1C in the last 72  hours. CBG: Recent Labs  Lab 06/11/19 1555 06/11/19 2100 06/12/19 0634 06/12/19 1119 06/12/19 1606  GLUCAP 90 103* 100* 114* 114*   Lipid Profile: No results for input(s): CHOL, HDL, LDLCALC, TRIG, CHOLHDL, LDLDIRECT in the last 72 hours. Thyroid Function Tests: No results for input(s): TSH, T4TOTAL, FREET4, T3FREE, THYROIDAB in the last 72 hours. Anemia Panel: No results for input(s): VITAMINB12, FOLATE, FERRITIN, TIBC, IRON, RETICCTPCT in the last 72 hours. Urine analysis:    Component Value Date/Time   COLORURINE YELLOW 05/31/2019 0029   APPEARANCEUR CLEAR 05/31/2019 0029   APPEARANCEUR Cloudy 12/14/2013 1727   LABSPEC 1.020 05/31/2019 0029   LABSPEC 1.012 12/14/2013 1727   PHURINE 6.0 05/31/2019 0029   GLUCOSEU NEGATIVE 05/31/2019  [place] 07/21/2017 1001   HGBUR SMALL (A) 05/31/2019 0029   BILIRUBINUR NEGATIVE 05/31/2019 0029   BILIRUBINUR Negative 12/14/2013 1727   KETONESUR 15 (A) 05/31/2019 0029   PROTEINUR >300 (A) 05/31/2019 0029   UROBILINOGEN 0.2 07/21/2017 1001   NITRITE NEGATIVE 05/31/2019 0029   LEUKOCYTESUR NEGATIVE 05/31/2019 0029   LEUKOCYTESUR 3+ 12/14/2013 1727   Sepsis Labs: @LABRCNTIP (procalcitonin:4,lacticidven:4)  ) Recent Results (from the past 240 hour(s))  SARS CORONAVIRUS 2 (TAT 6-24 HRS) Nasopharyngeal Nasopharyngeal Swab     Status: None   Collection Time: 06/08/19  2:06 AM   Specimen: Nasopharyngeal Swab  Result Value Ref Range Status   SARS Coronavirus 2 NEGATIVE NEGATIVE Final    Comment: (NOTE) SARS-CoV-2 target nucleic acids are NOT DETECTED. The SARS-CoV-2 RNA is generally detectable in upper and lower respiratory specimens during the acute phase of infection. Negative results do not preclude SARS-CoV-2 infection, do not rule out co-infections with other pathogens, and should not be used as the sole basis for treatment or other patient management decisions. Negative results must be combined with clinical  observations, patient history, and epidemiological information. The expected result is Negative. Fact Sheet for Patients: SugarRoll.be Fact Sheet for Healthcare Providers: https://www.woods-mathews.com/ This test is not yet approved or cleared by the [place] FDA and  has been authorized for detection and/or diagnosis of SARS-CoV-2 by FDA under an Emergency Use Authorization (EUA). This EUA will remain  in effect (meaning this test can be used) for the duration of the COVID-19 declaration under Section 56 4(b)(1) of the Act, 21 U.S.C. section 360bbb-3(b)(1), unless the authorization is terminated or revoked sooner. Performed at [facility], [place] [address], [place], [place] [address]   Surgical PCR screen     Status: None   Collection Time: 06/08/19  2:10 AM   Specimen: Nasal Mucosa; Nasal Swab  Result Value Ref Range Status   MRSA, PCR NEGATIVE NEGATIVE Final   Staphylococcus aureus NEGATIVE NEGATIVE Final    Comment: (NOTE) The Xpert SA Assay (FDA approved for NASAL specimens in patients 87 years of age and older), is one component of a comprehensive surveillance program. It is not intended to diagnose infection nor to guide or monitor treatment. Performed at [facility], [place] [address], [place], [place] [address]       Studies: No results found.  Scheduled Meds: . amLODipine  10 mg Oral Daily  . atorvastatin  40 mg Oral q1800  . Chlorhexidine Gluconate Cloth  6 each Topical Q0600  . Chlorhexidine Gluconate Cloth  6 each Topical Q0600  . darbepoetin (ARANESP) injection - NON-DIALYSIS  150 mcg Subcutaneous Q Sat-1800  . gabapentin  200 mg Oral QHS  . heparin  5,000 Units Subcutaneous Q8H  . hydrALAZINE  100 mg Oral Q8H  . insulin aspart  0-9 Units Subcutaneous TID WC  . labetalol  200 mg Oral BID  . levothyroxine  100 mcg Oral QAC breakfast    Continuous Infusions: . sodium chloride 10 mL/hr at 06/08/19  1235     LOS: 13 days     Cristal Deer, MD [facility]  To reach me or the doctor on call, go to: [email or web address] Password [facility]  06/12/2019, 5:34 PM

## 2019-06-12 NOTE — Progress Notes (Signed)
[facility] NEPHROLOGY PROGRESS NOTE  Assessment/ Plan: Pt is a 78 y.o. yo female  with history of DM, HTN, CHF, anemia, HLD,CKD 4 admitted with CHF exacerbation, consulted for AKI on CKD.  #AKI on CKD stage IV, nonoliguric now progressed to ESRD:  Baseline creatinine seems to be around3. AKI likely due to CHF exacerbation. UA with WBC and proteinuria, no RBC. Kidney ultrasound with stable echogenic kidney and mildly atrophy consistent with CKD. No hydronephrosis.  -Started dialysis on 06/08/2019.  Placement of right IJ TDC and left brachiocephalic AV fistula creation by Dr. Donnetta Hutching on 11/4. -Received 3 dialysis treatment so far and tolerated well.  Uremic symptoms improved.  I have discussed with the patient about continuing dialysis 3 times a week and arrangement of outpatient HD center.  She understood clearly and agreed with the plan.  Social worker evaluation ongoing for outpatient HD set up.  Next HD on Monday.  #Metabolic acidosis: dc sodium bicarbonate, on HD  # Anemia due to CKD: Iron saturation 17%. Continue IV iron. Continue ESA. Monitor CBC.  # Secondary hyperparathyroidism: PTH level99. Phosphorus level improved with HD.    # HTN/volume: Discontinue Lasix.  BP elevated therefore increase hydralazine to 100 3 times daily.  Continue amlodipine.  UF during HD.  Subjective: Seen and examined bedside.  Denied nausea, vomiting, chest pain, shortness of breath.  Feels much better.    Objective Vital signs in last 24 hours: Vitals:   06/11/19 1607 06/11/19 2103 06/12/19 0636 06/12/19 0958  BP: (!) 164/55 (!) 174/46 (!) 162/51 (!) 179/49  Pulse: 69 72 73 72  Resp: 20 18 18 14   Temp: 98 F (36.7 C) 98 F (36.7 C) 98.8 F (37.1 C)   TempSrc: Oral  Oral   SpO2: 97% 97% 98% 99%  Weight:   90.3 kg   Height:       Weight change: -2.589 kg  Intake/Output Summary (Last 24 hours) at 06/12/2019 1102 Last data filed at 06/12/2019 0900 Gross per 24 hour   Intake 720 ml  Output 100 ml  Net 620 ml       Labs: Basic Metabolic Panel: Recent Labs  Lab 06/08/19 1903 06/09/19 0419 06/10/19 0809  NA 136 137 136  K 4.2 3.8 3.8  CL 104 101 99  CO2 17* 22 24  GLUCOSE 154* 97 96  BUN 97* 47* 31*  CREATININE 6.25* 3.84* 3.66*  CALCIUM 8.5* 8.3* 8.3*  PHOS 7.3* 4.7* 3.8   Liver Function Tests: Recent Labs  Lab 06/06/19 0634  06/08/19 1903 06/09/19 0419 06/10/19 0809  AST 18  --   --   --   --   ALT 16  --   --   --   --   ALKPHOS 45  --   --   --   --   BILITOT 0.4  --   --   --   --   PROT 6.3*  --   --   --   --   ALBUMIN 1.6*   < > 1.7* 1.8* 1.8*   < > = values in this interval not displayed.   No results for input(s): LIPASE, AMYLASE in the last 168 hours. No results for input(s): AMMONIA in the last 168 hours. CBC: Recent Labs  Lab 06/07/19 0345 06/08/19 0327 06/08/19 1903 06/09/19 0419 06/10/19 0809  WBC 18.1* 19.3* 18.6* 16.9* 20.2*  HGB 7.7* 7.5* 8.1* 8.1* 7.7*  HCT 25.4* 24.2* 26.3* 26.4* 26.4*  MCV 84.9  84.3 84.6 84.1 88.6  PLT 385 389 370 363 320   Cardiac Enzymes: No results for input(s): CKTOTAL, CKMB, CKMBINDEX, TROPONINI in the last 168 hours. CBG: Recent Labs  Lab 06/11/19 0623 06/11/19 1134 06/11/19 1555 06/11/19 2100 06/12/19 0634  GLUCAP 83 88 90 103* 100*    Iron Studies: No results for input(s): IRON, TIBC, TRANSFERRIN, FERRITIN in the last 72 hours. Studies/Results: No results found.  Medications: Infusions: . sodium chloride 10 mL/hr at 06/08/19 1235    Scheduled Medications: . amLODipine  10 mg Oral Daily  . atorvastatin  40 mg Oral q1800  . Chlorhexidine Gluconate Cloth  6 each Topical Q0600  . darbepoetin (ARANESP) injection - NON-DIALYSIS  150 mcg Subcutaneous Q Sat-1800  . gabapentin  200 mg Oral QHS  . heparin  5,000 Units Subcutaneous Q8H  . hydrALAZINE  50 mg Oral Q8H  . insulin aspart  0-9 Units Subcutaneous TID WC  . labetalol  200 mg Oral BID  . levothyroxine   100 mcg Oral QAC breakfast    have reviewed scheduled and prn medications.  Physical Exam: General:NAD, comfortable Heart:RRR, s1s2 nl Lungs: Clear b/l, no crackle Abdomen:soft, Non-tender, non-distended Extremities: LE edema + Dialysis Access: Right IJ TDC and left aVF has good thrill and bruit.   Prasad  06/12/2019,11:02 AM  LOS: 13 days  Pager: [phone]

## 2019-06-13 LAB — BASIC METABOLIC PANEL
Anion gap: 13 (ref 5–15)
BUN: 34 mg/dL — ABNORMAL HIGH (ref 8–23)
CO2: 23 mmol/L (ref 22–32)
Calcium: 8.4 mg/dL — ABNORMAL LOW (ref 8.9–10.3)
Chloride: 96 mmol/L — ABNORMAL LOW (ref 98–111)
Creatinine, Ser: 4.5 mg/dL — ABNORMAL HIGH (ref 0.44–1.00)
GFR calc Af Amer: 10 mL/min — ABNORMAL LOW (ref >60)
GFR calc non Af Amer: 9 mL/min — ABNORMAL LOW (ref >60)
Glucose, Bld: 95 mg/dL (ref 70–99)
Potassium: 3.5 mmol/L (ref 3.5–5.1)
Sodium: 132 mmol/L — ABNORMAL LOW (ref 135–145)

## 2019-06-13 LAB — CBC
HCT: 26.1 % — ABNORMAL LOW (ref 36.0–46.0)
Hemoglobin: 7.7 g/dL — ABNORMAL LOW (ref 12.0–15.0)
MCH: 25.7 pg — ABNORMAL LOW (ref 26.0–34.0)
MCHC: 29.5 g/dL — ABNORMAL LOW (ref 30.0–36.0)
MCV: 87 fL (ref 80.0–100.0)
Platelets: 242 10*3/uL (ref 150–400)
RBC: 3 MIL/uL — ABNORMAL LOW (ref 3.87–5.11)
RDW: 16.7 % — ABNORMAL HIGH (ref 11.5–15.5)
WBC: 20.8 10*3/uL — ABNORMAL HIGH (ref 4.0–10.5)
nRBC: 0.1 % (ref 0.0–0.2)

## 2019-06-13 LAB — GLUCOSE, CAPILLARY
Glucose-Capillary: 91 mg/dL (ref 70–99)
Glucose-Capillary: 111 mg/dL — ABNORMAL HIGH (ref 70–99)
Glucose-Capillary: 96 mg/dL (ref 70–99)
Glucose-Capillary: 97 mg/dL (ref 70–99)

## 2019-06-13 LAB — ALBUMIN: Albumin: 1.7 g/dL — ABNORMAL LOW (ref 3.5–5.0)

## 2019-06-13 LAB — PHOSPHORUS: Phosphorus: 3.8 mg/dL (ref 2.5–4.6)

## 2019-06-13 MED ORDER — LIDOCAINE HCL (PF) 1 % IJ SOLN: 5.0000 mL | INTRAMUSCULAR | Status: DC | PRN

## 2019-06-13 MED ORDER — PENTAFLUOROPROP-TETRAFLUOROETH EX AERO: 1.0000 "application " | INHALATION_SPRAY | CUTANEOUS | Status: DC | PRN

## 2019-06-13 MED ORDER — HEPARIN SODIUM (PORCINE) 1000 UNIT/ML IJ SOLN
INTRAMUSCULAR | Status: AC
  Filled 2019-06-13: qty 4

## 2019-06-13 MED ORDER — HEPARIN SODIUM (PORCINE) 1000 UNIT/ML DIALYSIS
20.0000 [IU]/kg | INTRAMUSCULAR | Status: DC | PRN
  Administered 2019-06-13: 3400 [IU] via INTRAVENOUS_CENTRAL
  Administered 2019-06-16: 07:00:00 1800 [IU] via INTRAVENOUS_CENTRAL
  Filled 2019-06-13 (×4): qty 2

## 2019-06-13 MED ORDER — SODIUM CHLORIDE 0.9 % IV SOLN: 100.0000 mL | INTRAVENOUS | Status: DC | PRN

## 2019-06-13 MED ORDER — ALTEPLASE 2 MG IJ SOLR: 2.0000 mg | Freq: Once | INTRAMUSCULAR | Status: DC | PRN

## 2019-06-13 MED ORDER — HEPARIN SODIUM (PORCINE) 1000 UNIT/ML DIALYSIS
1000.0000 [IU] | INTRAMUSCULAR | Status: DC | PRN
  Administered 2019-06-16: 11:00:00 3400 [IU] via INTRAVENOUS_CENTRAL
  Filled 2019-06-13 (×3): qty 1

## 2019-06-13 MED ORDER — LIDOCAINE-PRILOCAINE 2.5-2.5 % EX CREA
1.0000 "application " | TOPICAL_CREAM | CUTANEOUS | Status: DC | PRN
  Filled 2019-06-13: qty 5

## 2019-06-13 NOTE — Care Management Important Message (Signed)
Important Message  Patient Details  Name: Taylor Tyler MRN: [id number] Date of Birth: October 27, 1940   Medicare Important Message Given:  Yes     [name] 06/13/2019, 12:18 PM

## 2019-06-13 NOTE — TOC Progression Note (Signed)
Transition of Care [facility]) - Progression Note    Patient Details  Name: Taylor Tyler MRN: [id number] Date of Birth: 1941-07-14  Transition of Care [facility]) CM/SW [place], [place] Phone Number: 06/13/2019, 5:00 PM  Clinical Narrative:     PT changed recommendation to SNF. CSW called and spoke with the patient's son, Adelai Achey. CSW introduced herself and explained her role. CSW shared change in therapy recommendation. He is agreeable to rehab. The patient has been to [facility] in the past and they liked the care she received. Mr. Dezeeuw declined CMS SNF List. He stated that is his only choice for her. CSW shared that they might not have a bed available, he stated that they could discuss about that if a bed wasn't available.   CSW obtained permission to fax the patient out. CSW will provide bed offer to patient son.   CSW will continue to follow and assist with disposition planning.   Expected Discharge Plan: Skilled Nursing Facility Barriers to Discharge: Ship broker, Continued Medical Work up  Expected Discharge Plan and Services Expected Discharge Plan: [place] In-house Referral: NA Discharge Planning Services: CM Consult Post Acute Care Choice: [place] Living arrangements for the past 2 months: Single Family Home                 DME Arranged: (NA)         HH Arranged: RN, PT [place] Agency: [place] Date [facility] Agency Contacted: 06/10/19 Time [place]: [id number] Representative spoke with at [place]: cory   Social Determinants of Health ([place]) Interventions    Readmission Risk Interventions No flowsheet data found.

## 2019-06-13 NOTE — Progress Notes (Signed)
PROGRESS NOTE  Taylor Tyler LOV:[id number] DOB: [birth year] DOA: 05/30/2019 PCP: McLean-Scocuzza, Nino Glow, MD  HPI/Recap of past 24 hours: Patient seen and examined at bedside reports no complaints or adverse event overnight.  Subjective June 12, 2019:   Patient seen and examined at bedside denies any new complaints.  She is aware that she is waiting for dialysis chair for hemodialysis as outpatient   Subjective June 13, 2019: Patient seen and examined at bedside denies any new complaints.  She is aware that she is waiting for dialysis chair for hemodialysis as outpatient.  Patient had hemodialysis today but she did not remember that she had she seemed a little confused.     Assessment/Plan: Principal Problem:   Acute CHF (congestive heart failure) (HCC) Active Problems:   Hypothyroidism   DM (diabetes mellitus), type 2 with renal complications (HCC)   Anemia   Acute renal failure superimposed on stage 3 chronic kidney disease (HCC)   Essential hypertension   Pressure injury of skin  #1 acute on chronic renal failure.  She is status post tunneled dialysis catheter Case management and social worker helping to arrange outpatient hemodialysis she had her third hemodialysis yesterday which was June 10, 2019.  Her next hemodialysis on Monday  2.  Acute on chronic diastolic CHF continue hemodialysis which will help with fluid management.  Strict I/O  3.  Hypothyroidism continue levothyroxine labs are normal  4 type 2 diabetes mellitus continue sliding scale  5 anemia of chronic disease likely secondary to chronic kidney disease hemoglobin is stable and at baseline continue iron therapy  6 hypertension uncontrolled continue amlodipine hydralazine and labetalol  7 hyperlipidemia continue atorvastatin   Code Status: Full  Severity of Illness: The appropriate patient status for this patient is INPATIENT. Inpatient status is judged to be reasonable and necessary in order  to provide the required intensity of service to ensure the patient's safety. The patient's presenting symptoms, physical exam findings, and initial radiographic and laboratory data in the context of their chronic comorbidities is felt to place them at high risk for further clinical deterioration. Furthermore, it is not anticipated that the patient will be medically stable for discharge from the hospital within 2 midnights of admission. The following factors support the patient status of inpatient.    " The worrisome physical exam findings include hemodialysis. " The initial radiographic and laboratory data are worrisome because of hemodialysis. " The chronic co-morbidities include congestive heart failure chronic kidney disease end-stage renal disease diabetes hypothyroidism anemia of chronic disease.   * I certify that at the point of admission it is my clinical judgment that the patient will require inpatient hospital care spanning beyond 2 midnights from the point of admission due to high intensity of service, high risk for further deterioration and high frequency of surveillance required.*    Family Communication: None at bedside  Disposition Plan: Home when stable patient is waiting for hemodialysis chair   Consultants:  Nephrology  Procedures:  Hemodialysis  Antimicrobials:  None  DVT prophylaxis: Heparin   Objective: Vitals:   06/13/19 1025 06/13/19 1055 06/13/19 1125 06/13/19 1129  BP: (!) 160/68 (!) 160/68 (!) 168/65 (!) 148/65  Pulse: 78 78 79 79  Resp: 20 (!) 22 18 20   Temp:    (!) 97.4 F (36.3 C)  TempSrc:    Oral  SpO2:    98%  Weight:      Height:        Intake/Output Summary (Last  24 hours) at 06/13/2019 1747 Last data filed at 06/13/2019 1305 Gross per 24 hour  Intake 120 ml  Output 3151 ml  Net -3031 ml   Filed Weights   06/12/19 0636 06/13/19 0345 06/13/19 0715  Weight: 90.3 kg 90.9 kg 104.5 kg   Body mass index is 40.81 kg/m.  Exam:  .  General: 78 y.o. year-old female well developed well nourished in no acute distress.  Alert and oriented x3. . Cardiovascular: Regular rate and rhythm with no rubs or gallops.  No thyromegaly or JVD noted.   Marland Kitchen Respiratory: Clear to auscultation with no wheezes or rales. Good inspiratory effort. . Abdomen: Soft nontender nondistended with normal bowel sounds x4 quadrants. . Musculoskeletal: Positive lower extremity edema. 2/4 pulses in all 4 extremities. . Skin: No ulcerative lesions noted or rashes, . Psychiatry: Mood is appropriate for condition and setting    Data Reviewed: CBC: Recent Labs  Lab 06/08/19 0327 06/08/19 1903 06/09/19 0419 06/10/19 0809 06/13/19 0458  WBC 19.3* 18.6* 16.9* 20.2* 20.8*  HGB 7.5* 8.1* 8.1* 7.7* 7.7*  HCT 24.2* 26.3* 26.4* 26.4* 26.1*  MCV 84.3 84.6 84.1 88.6 87.0  PLT 389 370 363 320 740   Basic Metabolic Panel: Recent Labs  Lab 06/07/19 0345 06/08/19 0327 06/08/19 1903 06/09/19 0419 06/10/19 0809 06/13/19 0458  NA 133* 136 136 137 136 132*  K 4.5 4.4 4.2 3.8 3.8 3.5  CL 104 104 104 101 99 96*  CO2 17* 18* 17* 22 24 23   GLUCOSE 96 89 154* 97 96 95  BUN 88* 93* 97* 47* 31* 34*  CREATININE 6.01* 6.21* 6.25* 3.84* 3.66* 4.50*  CALCIUM 8.1* 8.4* 8.5* 8.3* 8.3* 8.4*  MG 1.6* 1.6*  --  1.7  --   --   PHOS 6.2* 6.3* 7.3* 4.7* 3.8 3.8   GFR: Estimated Creatinine Clearance: 12.3 mL/min (A) (by C-G formula based on SCr of 4.5 mg/dL (H)). Liver Function Tests: Recent Labs  Lab 06/08/19 0327 06/08/19 1903 06/09/19 0419 06/10/19 0809 06/13/19 0458  ALBUMIN 1.6* 1.7* 1.8* 1.8* 1.7*   No results for input(s): LIPASE, AMYLASE in the last 168 hours. No results for input(s): AMMONIA in the last 168 hours. Coagulation Profile: Recent Labs  Lab 06/08/19 0327  INR 1.3*   Cardiac Enzymes: No results for input(s): CKTOTAL, CKMB, CKMBINDEX, TROPONINI in the last 168 hours. BNP (last 3 results) No results for input(s): PROBNP in the last 8760  hours. HbA1C: No results for input(s): HGBA1C in the last 72 hours. CBG: Recent Labs  Lab 06/12/19 1606 06/12/19 2101 06/13/19 0555 06/13/19 1130 06/13/19 1631  GLUCAP 114* 105* 91 111* 96   Lipid Profile: No results for input(s): CHOL, HDL, LDLCALC, TRIG, CHOLHDL, LDLDIRECT in the last 72 hours. Thyroid Function Tests: No results for input(s): TSH, T4TOTAL, FREET4, T3FREE, THYROIDAB in the last 72 hours. Anemia Panel: No results for input(s): VITAMINB12, FOLATE, FERRITIN, TIBC, IRON, RETICCTPCT in the last 72 hours. Urine analysis:    Component Value Date/Time   COLORURINE YELLOW 05/31/2019 0029   APPEARANCEUR CLEAR 05/31/2019 0029   APPEARANCEUR Cloudy 12/14/2013 1727   LABSPEC 1.020 05/31/2019 0029   LABSPEC 1.012 12/14/2013 1727   PHURINE 6.0 05/31/2019 0029   GLUCOSEU NEGATIVE 05/31/2019 0029   GLUCOSEU NEGATIVE 07/21/2017 1001   HGBUR SMALL (A) 05/31/2019 0029   BILIRUBINUR NEGATIVE 05/31/2019 0029   BILIRUBINUR Negative 12/14/2013 1727   KETONESUR 15 (A) 05/31/2019 0029   PROTEINUR >300 (A) 05/31/2019 0029   UROBILINOGEN 0.2  07/21/2017 1001   NITRITE NEGATIVE 05/31/2019 0029   LEUKOCYTESUR NEGATIVE 05/31/2019 0029   LEUKOCYTESUR 3+ 12/14/2013 1727   Sepsis Labs: @LABRCNTIP (procalcitonin:4,lacticidven:4)  ) Recent Results (from the past 240 hour(s))  SARS CORONAVIRUS 2 (TAT 6-24 HRS) Nasopharyngeal Nasopharyngeal Swab     Status: None   Collection Time: 06/08/19  2:06 AM   Specimen: Nasopharyngeal Swab  Result Value Ref Range Status   SARS Coronavirus 2 NEGATIVE NEGATIVE Final    Comment: (NOTE) SARS-CoV-2 target nucleic acids are NOT DETECTED. The SARS-CoV-2 RNA is generally detectable in upper and lower respiratory specimens during the acute phase of infection. Negative results do not preclude SARS-CoV-2 infection, do not rule out co-infections with other pathogens, and should not be used as the sole basis for treatment or other patient management  decisions. Negative results must be combined with clinical observations, patient history, and epidemiological information. The expected result is Negative. Fact Sheet for Patients: SugarRoll.be Fact Sheet for Healthcare Providers: https://www.woods-mathews.com/ This test is not yet approved or cleared by the [place] FDA and  has been authorized for detection and/or diagnosis of SARS-CoV-2 by FDA under an Emergency Use Authorization (EUA). This EUA will remain  in effect (meaning this test can be used) for the duration of the COVID-19 declaration under Section 56 4(b)(1) of the Act, 21 U.S.C. section 360bbb-3(b)(1), unless the authorization is terminated or revoked sooner. Performed at [facility], [place] [address] [place], [place] [address]   Surgical PCR screen     Status: None   Collection Time: 06/08/19  2:10 AM   Specimen: Nasal Mucosa; Nasal Swab  Result Value Ref Range Status   MRSA, PCR NEGATIVE NEGATIVE Final   Staphylococcus aureus NEGATIVE NEGATIVE Final    Comment: (NOTE) The Xpert SA Assay (FDA approved for NASAL specimens in patients 4 years of age and older), is one component of a comprehensive surveillance program. It is not intended to diagnose infection nor to guide or monitor treatment. Performed at [facility], [place] [address] [place], [place] [address]       Studies: No results found.  Scheduled Meds: . amLODipine  10 mg Oral Daily  . atorvastatin  40 mg Oral q1800  . Chlorhexidine Gluconate Cloth  6 each Topical Q0600  . Chlorhexidine Gluconate Cloth  6 each Topical Q0600  . darbepoetin (ARANESP) injection - NON-DIALYSIS  150 mcg Subcutaneous Q Sat-1800  . gabapentin  200 mg Oral QHS  . heparin      . heparin  5,000 Units Subcutaneous Q8H  . hydrALAZINE  100 mg Oral Q8H  . insulin aspart  0-9 Units Subcutaneous TID WC  . labetalol  200 mg Oral BID  . levothyroxine  100 mcg Oral QAC  breakfast    Continuous Infusions: . sodium chloride 10 mL/hr at 06/08/19 1235  . sodium chloride    . sodium chloride       LOS: 14 days     Cristal Deer, MD [facility]  To reach me or the doctor on call, go to: [email or web address] Password [facility]  06/13/2019, 5:47 PM

## 2019-06-13 NOTE — Progress Notes (Signed)
[facility] NEPHROLOGY PROGRESS NOTE  Assessment/ Plan: Pt is a 78 y.o. yo female  with history of DM, HTN, CHF, anemia, HLD,CKD 4 admitted with CHF exacerbation, consulted for AKI on CKD.  #AKI on CKD stage IV, nonoliguric now progressed to ESRD: Baseline creatinine seems to be around3. AKI likely due to CHF exacerbation. UA with WBC and proteinuria, no RBC. Kidney ultrasound with stable echogenic kidney and mildly atrophy consistent with CKD. No hydronephrosis. Scheduled for HD today.  -Started dialysis on 06/08/2019. Placement of right IJ TDC and left brachiocephalic AV fistula creation by Dr. Donnetta Hutching on 11/4. -Pt agreeable to outpatient HD.   Social worker evaluation ongoing for outpatient HD set up.    #Metabolic acidosis: resolved. dc sodium bicarbonate, on HD  # Anemia due to CKD: Iron saturation 17%. Continue IV iron. Continue ESA. Monitor CBC.  # Secondary hyperparathyroidism: PTH level99. Phosphorus level improved with HD.    # HTN/volume: Discontinue Lasix.  BP elevated therefore increase hydralazine to 100 3 times daily.  Continue amlodipine.  UF during HD.  Subjective:  Patient has no new complaints today.  She is not happy about the thought of starting dialysis outpatient but states 'she'll do it if she has to'.    Objective Physical Exam: General:NAD, comfortable.  Laying in bed, currently getting HD.   Heart:RRR, s1s2 nl Lungs:clear b/l, no crackle Extremities:no edema.  Legs directly proximal to ankles are TTP bilaterally.  Dialysis Access: L AV fistula, right tunneled HD cath  Vital signs in last 24 hours: Vitals:   06/12/19 2110 06/12/19 2136 06/13/19 0345 06/13/19 0550  BP: (!) 170/67   (!) 166/58  Pulse: 80   73  Resp: 18   18  Temp: 100 F (37.8 C) 98.9 F (37.2 C)  99.3 F (37.4 C)  TempSrc: Oral Oral  Oral  SpO2:    95%  Weight:   90.9 kg   Height:       Weight change: 0.59 kg  Intake/Output Summary (Last 24 hours) at  06/13/2019 0804 Last data filed at 06/13/2019 0708 Gross per 24 hour  Intake 360 ml  Output 225 ml  Net 135 ml       Labs: Basic Metabolic Panel: Recent Labs  Lab 06/08/19 1903 06/09/19 0419 06/10/19 0809 06/13/19 0458  NA 136 137 136 132*  K 4.2 3.8 3.8 3.5  CL 104 101 99 96*  CO2 17* 22 24 23   GLUCOSE 154* 97 96 95  BUN 97* 47* 31* 34*  CREATININE 6.25* 3.84* 3.66* 4.50*  CALCIUM 8.5* 8.3* 8.3* 8.4*  PHOS 7.3* 4.7* 3.8  --    Liver Function Tests: Recent Labs  Lab 06/08/19 1903 06/09/19 0419 06/10/19 0809  ALBUMIN 1.7* 1.8* 1.8*   No results for input(s): LIPASE, AMYLASE in the last 168 hours. No results for input(s): AMMONIA in the last 168 hours. CBC: Recent Labs  Lab 06/08/19 0327 06/08/19 1903 06/09/19 0419 06/10/19 0809 06/13/19 0458  WBC 19.3* 18.6* 16.9* 20.2* 20.8*  HGB 7.5* 8.1* 8.1* 7.7* 7.7*  HCT 24.2* 26.3* 26.4* 26.4* 26.1*  MCV 84.3 84.6 84.1 88.6 87.0  PLT 389 370 363 320 242   Cardiac Enzymes: No results for input(s): CKTOTAL, CKMB, CKMBINDEX, TROPONINI in the last 168 hours. CBG: Recent Labs  Lab 06/12/19 0634 06/12/19 1119 06/12/19 1606 06/12/19 2101 06/13/19 0555  GLUCAP 100* 114* 114* 105* 91    Iron Studies: No results for input(s): IRON, TIBC, TRANSFERRIN, FERRITIN in the last  72 hours. Studies/Results: No results found.  Medications: Infusions: . sodium chloride 10 mL/hr at 06/08/19 1235  . sodium chloride    . sodium chloride      Scheduled Medications: . amLODipine  10 mg Oral Daily  . atorvastatin  40 mg Oral q1800  . Chlorhexidine Gluconate Cloth  6 each Topical Q0600  . Chlorhexidine Gluconate Cloth  6 each Topical Q0600  . darbepoetin (ARANESP) injection - NON-DIALYSIS  150 mcg Subcutaneous Q Sat-1800  . gabapentin  200 mg Oral QHS  . heparin  5,000 Units Subcutaneous Q8H  . hydrALAZINE  100 mg Oral Q8H  . insulin aspart  0-9 Units Subcutaneous TID WC  . labetalol  200 mg Oral BID  . levothyroxine   100 mcg Oral QAC breakfast    have reviewed scheduled and prn medications.   [name] 06/13/2019,8:04 AM  LOS: 14 days  Pager: [phone]

## 2019-06-13 NOTE — Progress Notes (Signed)
Physical Therapy Treatment Patient Details Name: TAMBRA MULLER MRN: [id number] DOB: 1941-01-06 Today's Date: 06/13/2019    History of Present Illness 78 y.o. female admitted 05/30/19 after fall at home, laying on the floor at least 8 hours until found by son, also with worsening SOB and LE edema. VQ scan with low probability for PE. Worked up for HF. Other PMH includes CKD III, DM2, DJD s/p lumbar sx (2018), arthritis.    PT Comments    Pt was seen for mobility of gait and transfers, and was noted to have trouble with sequencing activity including self care.  Arrived with OT to see her and pt was sitting with no gown on and removing telemetry, could not say why.  Her plan is to ask for SNF placement due to cognitive issues and her safety with gait and transfers making trip directly home unsafe.  Follow acutely to recover her balance, endurance and safety with walker in room and on hallway.  Family may wish to take her directly home but will need to know that 24/7 help is best.   Follow Up Recommendations  SNF     Equipment Recommendations  None recommended by PT    Recommendations for Other Services       Precautions / Restrictions Precautions Precautions: Fall Precaution Comments: pulling off leads to telemetry Restrictions Weight Bearing Restrictions: No Other Position/Activity Restrictions: monitor for safety with RW    Mobility  Bed Mobility Overal bed mobility: Needs Assistance Bed Mobility: Sit to Supine       Sit to supine: Supervision   General bed mobility comments: legs up to bed without PT helping  Transfers Overall transfer level: Needs assistance Equipment used: Rolling walker (2 wheeled) Transfers: Sit to/from Stand Sit to Stand: Min guard         General transfer comment: min guard for safety and cues for hand placemetn  Ambulation/Gait Ambulation/Gait assistance: Min guard Gait Distance (Feet): 30 Feet(10 + 20) Assistive device: Rolling walker (2  wheeled) Gait Pattern/deviations: Step-through pattern;Shuffle;Wide base of support;Trunk flexed Gait velocity: Decreased Gait velocity interpretation: <1.31 ft/sec, indicative of household ambulator General Gait Details: pt is maneuvering RW in room with some help needed for obstacles and set up at bed   Stairs             Wheelchair Mobility    Modified Rankin (Stroke Patients Only)       Balance Overall balance assessment: Needs assistance Sitting-balance support: Feet supported Sitting balance-Leahy Scale: Good     Standing balance support: Bilateral upper extremity supported Standing balance-Leahy Scale: Poor                              Cognition Arousal/Alertness: Awake/alert Behavior During Therapy: Flat affect Overall Cognitive Status: Impaired/Different from baseline Area of Impairment: Orientation;Memory;Following commands;Safety/judgement;Awareness;Problem solving                 Orientation Level: Situation;Time Current Attention Level: Sustained Memory: Decreased recall of precautions;Decreased short-term memory Following Commands: Follows one step commands inconsistently;Follows one step commands with increased time Safety/Judgement: Decreased awareness of safety;Decreased awareness of deficits Awareness: Intellectual Problem Solving: Slow processing;Requires verbal cues        Exercises      General Comments General comments (skin integrity, edema, etc.): pt is notably struggling with how to move in room and at one point was just going to remain in straight back chair while fatigued.  Has not been able to make a plan to move back to bed without cues from PT and OT      Pertinent Vitals/Pain Pain Assessment: No/denies pain    Home Living                      Prior Function            PT Goals (current goals can now be found in the care plan section) Acute Rehab PT Goals Patient Stated Goal: back to  bed Progress towards PT goals: Not progressing toward goals - comment(still requires signficant help to manage safely on walker)    Frequency    Min 3X/week      PT Plan Discharge plan needs to be updated    Co-evaluation              AM-PAC PT "6 Clicks" Mobility   Outcome Measure  Help needed turning from your back to your side while in a flat bed without using bedrails?: A Little Help needed moving from lying on your back to sitting on the side of a flat bed without using bedrails?: A Little Help needed moving to and from a bed to a chair (including a wheelchair)?: A Lot Help needed standing up from a chair using your arms (e.g., wheelchair or bedside chair)?: A Lot Help needed to walk in hospital room?: A Little Help needed climbing 3-5 steps with a railing? : Total 6 Click Score: 14    End of Session Equipment Utilized During Treatment: Gait belt Activity Tolerance: Patient tolerated treatment well Patient left: with call bell/phone within reach;in bed;with bed alarm set Nurse Communication: Mobility status PT Visit Diagnosis: Other abnormalities of gait and mobility (R26.89)     Time: 3825-0539 PT Time Calculation (min) (ACUTE ONLY): 23 min  Charges:  $Gait Training: 8-22 mins                Ramond Dial 06/13/2019, 7:33 PM   Mee Hives, PT MS Acute Rehab Dept. Number: [place] [facility] [place]

## 2019-06-13 NOTE — Progress Notes (Signed)
Occupational Therapy Treatment Patient Details Name: Taylor Tyler MRN: [id number] DOB: [birth year] Today's Date: 06/13/2019    History of present illness Pt is a 78 y.o. female admitted 05/30/19 after fall at home, laying on the floor at least 8 hours until found by son, also with worsening SOB and LE edema. VQ scan with low probability for PE. Worked up for HF. Other PMH includes CKD III, DM2, DJD s/p lumbar sx (2018), arthritis.   OT comments  Pt found seated in straight chair across from sink with gown off and had pulled off her telemetry. Pt stating she was trying to change her gown, but confused, Stann Ore is this" with regard to telemetry although she has had the portable box since admission. Assisted pt in replacing leads and gown. Pt aware she was at "[facility]," but not oriented to situation. Requesting to return to bed with pt demonstrating decreased balance and need for use of RW (unclear how she got to the chair as RW was not nearby). Given pt's impaired cognition and her reported lack of 24 hour care, recommending ST rehab in SNF prior to returning home. CM notified. Will continue to follow.   Follow Up Recommendations  SNF;Supervision/Assistance - 24 hour    Equipment Recommendations  None recommended by OT    Recommendations for Other Services      Precautions / Restrictions Precautions Precautions: Fall       Mobility Bed Mobility Overal bed mobility: Needs Assistance Bed Mobility: Sit to Supine       Sit to supine: Supervision   General bed mobility comments: increased time, but able to lift LEs back into bed  Transfers Overall transfer level: Needs assistance Equipment used: Rolling walker (2 wheeled) Transfers: Sit to/from Stand Sit to Stand: Min guard         General transfer comment: min guard for safety, decreased initiation    Balance Overall balance assessment: Needs assistance   Sitting balance-Leahy Scale: Good     Standing balance  support: During functional activity Standing balance-Leahy Scale: Poor Standing balance comment: Requires BUE support in standing.                           ADL either performed or assessed with clinical judgement   ADL Overall ADL's : Needs assistance/impaired                 Upper Body Dressing : Moderate assistance;Sitting                   Functional mobility during ADLs: Min guard;Rolling walker       Vision       Perception     Praxis      Cognition Arousal/Alertness: Awake/alert Behavior During Therapy: Flat affect Overall Cognitive Status: Impaired/Different from baseline Area of Impairment: Orientation;Memory;Following commands;Safety/judgement;Awareness;Problem solving;Attention                 Orientation Level: Disoriented to;Time;Situation;Place;Person Current Attention Level: Sustained Memory: Decreased short-term memory Following Commands: Follows one step commands with increased time;Follows multi-step commands inconsistently Safety/Judgement: Decreased awareness of safety;Decreased awareness of deficits Awareness: Intellectual Problem Solving: Slow processing;Decreased initiation;Difficulty sequencing;Requires verbal cues;Requires tactile cues          Exercises     Shoulder Instructions       General Comments      Pertinent Vitals/ Pain       Pain Assessment: No/denies pain  Home Living  Prior Functioning/Environment              Frequency  Min 2X/week        Progress Toward Goals  OT Goals(current goals can now be found in the care plan section)  Progress towards OT goals: Not progressing toward goals - comment(pt with increased confusion and weakness)  Acute Rehab OT Goals Patient Stated Goal: to lay back down OT Goal Formulation: With patient Time For Goal Achievement: 06/27/19 Potential to Achieve Goals: Good  Plan Discharge plan  needs to be updated    Co-evaluation                 AM-PAC OT "6 Clicks" Daily Activity     Outcome Measure   Help from another person eating meals?: None Help from another person taking care of personal grooming?: A Little Help from another person toileting, which includes using toliet, bedpan, or urinal?: A Little Help from another person bathing (including washing, rinsing, drying)?: A Little Help from another person to put on and taking off regular upper body clothing?: A Lot Help from another person to put on and taking off regular lower body clothing?: A Little 6 Click Score: 18    End of Session Equipment Utilized During Treatment: Rolling walker  OT Visit Diagnosis: Unsteadiness on feet (R26.81);Other symptoms and signs involving cognitive function;Other abnormalities of gait and mobility (R26.89);Muscle weakness (generalized) (M62.81)   Activity Tolerance Patient limited by fatigue   Patient Left in bed;with call bell/phone within reach;with bed alarm set   Nurse Communication          Time: 1194-1740 OT Time Calculation (min): 23 min  Charges: OT General Charges $OT Visit: 1 Visit OT Treatments $Self Care/Home Management : 8-22 mins  Nestor Lewandowsky, OTR/L Acute Rehabilitation Services Pager: [phone] Office: [phone]   [name] 06/13/2019, 2:30 PM

## 2019-06-13 NOTE — Progress Notes (Signed)
Renal Navigator left message for [facility] Admissions to request update on referral for OP HD seat. Navigator to continue to follow closely.  Alphonzo Cruise, [place] Renal Navigator [phone]

## 2019-06-13 NOTE — NC FL2 (Signed)
[place] LEVEL OF CARE SCREENING TOOL     IDENTIFICATION  Patient Name: Taylor Tyler Birthdate: 05/21/1941 Sex: female Admission Date (Current Location): 05/30/2019  [facility] and [place] Number:  Herbalist and Address:  [facility] [place] [place] [address] [place], [place] [address]      Provider Number: [id number]  Attending Physician Name and Address:  Cristal Deer, MD  Relative Name and Phone Number:  Irania, Durell, [phone]    Current Level of Care: Hospital Recommended Level of Care: [place] Prior Approval Number:    Date Approved/Denied:   PASRR Number: [id number]  Discharge Plan: SNF    Current Diagnoses: Patient Active Problem List   Diagnosis Date Noted  . Pressure injury of skin 06/08/2019  . Acute CHF (congestive heart failure) ([place]) 05/30/2019  . Infection and inflammatory reaction due to internal fixation device of spine, initial encounter ([place]) 06/05/2018  . Neuropathy 01/08/2018  . Fatty liver 01/05/2018  . Chronic pain of both knees 01/05/2018  . Ventral hernia 01/05/2018  . CKD (chronic kidney disease) stage 4, GFR 15-29 ml/min (HCC) 07/21/2017  . Vitamin D deficiency 07/21/2017  . Essential hypertension   . Postoperative wound infection 04/02/2017  . Anemia 03/14/2017  . Acute blood loss anemia 03/14/2017  . Acute renal failure superimposed on stage 3 chronic kidney disease ([place]) 03/14/2017  . Obesity, Class III, BMI 40-49.9 (morbid obesity) ([place]) 02/27/2017  . DM (diabetes mellitus), type 2 with renal complications ([place]) 81/08/7508  . Status post lumbar surgery 02/24/2017  . CKD (chronic kidney disease) stage 3, GFR 30-59 ml/min 01/15/2017  . Hypothyroidism 01/15/2017  . Diverticulosis 01/15/2017  . HLD (hyperlipidemia) 01/15/2017  . Spinal stenosis of lumbar region 01/15/2017  . Disease of pancreas 07/21/2012    Orientation RESPIRATION BLADDER Height & Weight      Self, Time, Situation, Place  Normal Continent, External catheter Weight: 230 lb 6.1 oz (104.5 kg) Height:  5\' 3"  (160 cm)  BEHAVIORAL SYMPTOMS/MOOD NEUROLOGICAL BOWEL NUTRITION STATUS      Continent Diet(Renal/Carb Modified diet, no snacks, thin liquids, fluid restriction 1215ml)  AMBULATORY STATUS COMMUNICATION OF NEEDS Skin   Limited Assist Verbally Surgical wounds, Skin abrasions, Other (Comment)(pressure injury on sacrum (dressing in place); incision on right neck and right arm (dressing in place); abrasion on buttocks; MASD on sacrum/coccyx; skin tear on buttocks; open wound on back (dressing in place change every 3 days))                       Personal Care Assistance Level of Assistance  Bathing, Dressing, Total care, Feeding Bathing Assistance: Maximum assistance Feeding assistance: Limited assistance Dressing Assistance: Maximum assistance Total Care Assistance: Maximum assistance   Functional Limitations Info  Sight, Hearing, Speech Sight Info: Adequate Hearing Info: Adequate Speech Info: Adequate    SPECIAL CARE FACTORS FREQUENCY  PT (By licensed PT), OT (By licensed OT)     PT Frequency: 5x/wk OT Frequency: 5x/wk            Contractures Contractures Info: Not present    Additional Factors Info  Code Status, Allergies, Insulin Sliding Scale Code Status Info: Full Code Allergies Info: Clams (Shellfish Allergy), Hydralazine, Norvasc (Amlodipine Besylate),  Milk-related Compounds   Insulin Sliding Scale Info: insulin aspart novolog 0-9 units 3x daily w/meals       Current Medications (06/13/2019):  This is the current hospital active medication list Current Facility-Administered Medications  Medication Dose Route Frequency Provider Last Rate Last Dose  . 0.9 %  sodium chloride infusion   Intravenous Continuous Ulyses Amor, PA-C 10 mL/hr at 06/08/19 1235    . 0.9 %  sodium chloride infusion  100 mL Intravenous PRN Rosita Fire, MD      . 0.9  %  sodium chloride infusion  100 mL Intravenous PRN Rosita Fire, MD      . acetaminophen (TYLENOL) tablet 650 mg  650 mg Oral Q6H PRN Ulyses Amor, PA-C   650 mg at 06/08/19 1934   Or  . acetaminophen (TYLENOL) suppository 650 mg  650 mg Rectal Q6H PRN Ulyses Amor, PA-C      . alteplase (CATHFLO ACTIVASE) injection 2 mg  2 mg Intracatheter Once PRN Rosita Fire, MD      . amLODipine (NORVASC) tablet 10 mg  10 mg Oral Daily Laurence Slate M, PA-C   10 mg at 06/13/19 1148  . atorvastatin (LIPITOR) tablet 40 mg  40 mg Oral q1800 Ulyses Amor, PA-C   40 mg at 06/12/19 1741  . Chlorhexidine Gluconate Cloth 2 % PADS 6 each  6 each Topical Q0600 Ulyses Amor, PA-C   6 each at 06/12/19 1124  . Chlorhexidine Gluconate Cloth 2 % PADS 6 each  6 each Topical Q0600 Rosita Fire, MD   6 each at 06/13/19 0346  . Darbepoetin Alfa (ARANESP) injection 150 mcg  150 mcg Subcutaneous Q Sat-1800 Laurence Slate M, PA-C   150 mcg at 06/11/19 1751  . gabapentin (NEURONTIN) capsule 200 mg  200 mg Oral QHS Theresa Duty, Anurag, MD   200 mg at 06/12/19 2115  . heparin 1000 UNIT/ML injection           . heparin injection 1,000 Units  1,000 Units Dialysis PRN Rosita Fire, MD      . heparin injection 1,800 Units  20 Units/kg Dialysis PRN Rosita Fire, MD   3,400 Units at 06/13/19 1112  . heparin injection 5,000 Units  5,000 Units Subcutaneous Q8H Ulyses Amor, [place]   5,000 Units at 06/13/19 1508  . hydrALAZINE (APRESOLINE) injection 10 mg  10 mg Intravenous Q4H PRN Laurence Slate M, PA-C   10 mg at 06/11/19 1232  . hydrALAZINE (APRESOLINE) tablet 100 mg  100 mg Oral Q8H Rosita Fire, MD   100 mg at 06/13/19 1506  . HYDROcodone-acetaminophen (NORCO/VICODIN) 5-325 MG per tablet 1-2 tablet  1-2 tablet Oral Q4H PRN Ulyses Amor, PA-C   1 tablet at 06/09/19 2237  . insulin aspart (novoLOG) injection 0-9 Units  0-9 Units Subcutaneous TID WC Ulyses Amor, PA-C   2  Units at 06/02/19 1221  . ipratropium (ATROVENT) nebulizer solution 0.5 mg  0.5 mg Nebulization Q6H PRN Laurence Slate M, PA-C      . labetalol (NORMODYNE) tablet 200 mg  200 mg Oral BID Laurence Slate M, PA-C   200 mg at 06/13/19 1149  . levalbuterol (XOPENEX) nebulizer solution 1.25 mg  1.25 mg Nebulization Q6H PRN Laurence Slate M, PA-C      . levothyroxine (SYNTHROID) tablet 100 mcg  100 mcg Oral QAC breakfast Laurence Slate M, PA-C   100 mcg at 06/13/19 0551  . lidocaine (PF) (XYLOCAINE) 1 % injection 5 mL  5 mL Intradermal PRN Rosita Fire, MD      . lidocaine-prilocaine (EMLA) cream 1 application  1 application Topical PRN Rosita Fire, MD      .  ondansetron (ZOFRAN) tablet 4 mg  4 mg Oral Q6H PRN Laurence Slate M, PA-C       Or  . ondansetron [facility]) injection 4 mg  4 mg Intravenous Q6H PRN Laurence Slate M, PA-C      . pentafluoroprop-tetrafluoroeth (GEBAUERS) aerosol 1 application  1 application Topical PRN Rosita Fire, MD         Discharge Medications: Please see discharge summary for a list of discharge medications.  Relevant Imaging Results:  Relevant Lab Results:   Additional Information SSN: [id number]; Will be new HD dialysis, will need to be clipped; COVID negative on 10/26 & 11/04  Kazuo Durnil B Alisse Tuite, LCSWA

## 2019-06-13 NOTE — Consult Note (Addendum)
[place] Nurse wound consult note  Reason for Consult: Stage 2 on buttock Wound type: ruptured blister with loss of blister roof; located on the upper left buttock; very distinct area with no surrounding evidence of pressure Not consistent with area of pressure  Pressure Injury POA: NA Measurement:1.0cm x 0.7cm x 0.1cm  Wound YOK:HTXHFSF thickness skin loss; clean and moist Drainage (amount, consistency, odor) none Periwound:intact  Dressing procedure/placement/frequency: Continue silicone foam implemented per the skin care order set.   Discussed POC with patient and bedside nurse.  Re consult if needed, will not follow at this time. Thanks  Tecla Mailloux [employer], RN,CWOCN, CNS, [place] [phone])

## 2019-06-14 LAB — GLUCOSE, CAPILLARY
Glucose-Capillary: 93 mg/dL (ref 70–99)
Glucose-Capillary: 101 mg/dL — ABNORMAL HIGH (ref 70–99)
Glucose-Capillary: 114 mg/dL — ABNORMAL HIGH (ref 70–99)
Glucose-Capillary: 90 mg/dL (ref 70–99)

## 2019-06-14 NOTE — Progress Notes (Signed)
[facility] NEPHROLOGY PROGRESS NOTE  Assessment/ Plan: Pt is a 78 y.o. yo female  with history of DM, HTN, CHF, anemia, HLD,CKD 4 admitted with CHF exacerbation, consulted for AKI on CKD.  #AKI on CKD stage IV, nonoliguric now progressed to ESRD: Baseline creatinine seems to be around3. AKI likely due to CHF exacerbation. HD yesterday.  Pt has been accepted to [facility] [place] [facility]. Pt currently awaiting SNF placement.  -Started dialysis on 06/08/2019. Placement of right IJ TDC and left brachiocephalic AV fistula creation by Dr. Donnetta Hutching on 11/4.  # Anemia due to CKD: Iron saturation 17%. Continue IV iron. Continue ESA. Monitor CBC.  # Secondary hyperparathyroidism: PTH level99. Phosphorus level improved with HD.    # HTN/volume: continues to be elevated.  hydralazine to 100 3 times daily.  Continue amlodipine and labetalol.   Subjective:  Pt states she feels much better today.  States she feels 'ready to go home'.   Objective Physical Exam: General:NAD, comfortable. Sitting in chair.    Heart:RRR, s1s2 nl Lungs:clear b/l, no crackle Extremities:no edema.  Legs directly proximal to ankles are TTP bilaterally.  Dialysis Access: L AV fistula, right tunneled HD cath  Vital signs in last 24 hours: Vitals:   06/14/19 0036 06/14/19 0545 06/14/19 0550 06/14/19 0827  BP: 134/90  (!) 160/58 (!) 156/54  Pulse: 70  74 74  Resp: 16  17 20   Temp: 98.6 F (37 C)  98.3 F (36.8 C) (!) 97.4 F (36.3 C)  TempSrc: Oral  Oral Oral  SpO2: 94%  99% 98%  Weight:  87.3 kg    Height:       Weight change: 13.6 kg  Intake/Output Summary (Last 24 hours) at 06/14/2019 0921 Last data filed at 06/14/2019 0830 Gross per 24 hour  Intake 600 ml  Output 3001 ml  Net -2401 ml       Labs: Basic Metabolic Panel: Recent Labs  Lab 06/09/19 0419 06/10/19 0809 06/13/19 0458  NA 137 136 132*  K 3.8 3.8 3.5  CL 101 99 96*  CO2 22 24 23   GLUCOSE 97 96  95  BUN 47* 31* 34*  CREATININE 3.84* 3.66* 4.50*  CALCIUM 8.3* 8.3* 8.4*  PHOS 4.7* 3.8 3.8   Liver Function Tests: Recent Labs  Lab 06/09/19 0419 06/10/19 0809 06/13/19 0458  ALBUMIN 1.8* 1.8* 1.7*   No results for input(s): LIPASE, AMYLASE in the last 168 hours. No results for input(s): AMMONIA in the last 168 hours. CBC: Recent Labs  Lab 06/08/19 0327 06/08/19 1903 06/09/19 0419 06/10/19 0809 06/13/19 0458  WBC 19.3* 18.6* 16.9* 20.2* 20.8*  HGB 7.5* 8.1* 8.1* 7.7* 7.7*  HCT 24.2* 26.3* 26.4* 26.4* 26.1*  MCV 84.3 84.6 84.1 88.6 87.0  PLT 389 370 363 320 242   Cardiac Enzymes: No results for input(s): CKTOTAL, CKMB, CKMBINDEX, TROPONINI in the last 168 hours. CBG: Recent Labs  Lab 06/13/19 0555 06/13/19 1130 06/13/19 1631 06/13/19 2107 06/14/19 0601  GLUCAP 91 111* 96 97 93    Iron Studies: No results for input(s): IRON, TIBC, TRANSFERRIN, FERRITIN in the last 72 hours. Studies/Results: No results found.  Medications: Infusions: . sodium chloride 10 mL/hr at 06/08/19 1235  . sodium chloride    . sodium chloride      Scheduled Medications: . amLODipine  10 mg Oral Daily  . atorvastatin  40 mg Oral q1800  . Chlorhexidine Gluconate Cloth  6 each Topical Q0600  . Chlorhexidine Gluconate Cloth  6 each Topical Q0600  . darbepoetin (ARANESP) injection - NON-DIALYSIS  150 mcg Subcutaneous Q Sat-1800  . gabapentin  200 mg Oral QHS  . heparin  5,000 Units Subcutaneous Q8H  . hydrALAZINE  100 mg Oral Q8H  . insulin aspart  0-9 Units Subcutaneous TID WC  . labetalol  200 mg Oral BID  . levothyroxine  100 mcg Oral QAC breakfast    have reviewed scheduled and prn medications.   Taylor Tyler 06/14/2019,9:21 AM  LOS: 15 days  Pager: [phone]

## 2019-06-14 NOTE — TOC Progression Note (Addendum)
Transition of Care [facility]) - Progression Note    Patient Details  Name: LAMIKA CONNOLLY MRN: [id number] Date of Birth: 05/23/1941  Transition of [facility]) CM/SW [place], [place] Phone Number: [phone] 06/14/2019, 9:11 AM  Clinical Narrative:     Update: CSW spoke with son [name], he is in agreement with CSW faxing out more referrals to identify who may be able to accept patient and transport to dialysis. He reports him and his brother both work and could not accommodate the 24/7 supervision at home that is recommended if they decided to take patient home. CSW faxing out referrals, will email [name] bed offers once identified.   CSW has lvm with patient son [name].   CSW attempting to inform [name] that [facility] is not currently accepting HD patients, family will need to choose a different SNF facility.   Expected Discharge Plan: Skilled Nursing Facility Barriers to Discharge: Ship broker, Continued Medical Work up  Expected Discharge Plan and Services Expected Discharge Plan: [place] In-house Referral: NA Discharge Planning Services: CM Consult Post Acute Care Choice: [place] Living arrangements for the past 2 months: Single Family Home                 DME Arranged: (NA)         HH Arranged: RN, PT [place] Agency: [place] Date [facility] Agency Contacted: 06/10/19 Time [place]: [id number] Representative spoke with at [place]: cory   Social Determinants of Health ([place]) Interventions    Readmission Risk Interventions No flowsheet data found.

## 2019-06-14 NOTE — Progress Notes (Signed)
PROGRESS NOTE  Taylor Tyler AVW:[id number] DOB: 09-28-1940 DOA: 05/30/2019 PCP: McLean-Scocuzza, Nino Glow, MD  HPI/Recap of past 24 hours: Patient seen and examined at bedside reports no complaints or adverse event overnight.  Subjective June 12, 2019:   Patient seen and examined at bedside denies any new complaints.  She is aware that she is waiting for dialysis chair for hemodialysis as outpatient   Subjective June 13, 2019: Patient seen and examined at bedside denies any new complaints.  She is aware that she is waiting for dialysis chair for hemodialysis as outpatient.  Patient had hemodialysis today but she did not remember that she had she seemed a little confused.  06/14/2019 SUBJECTIVE: Patient seen and examined at bedside denies any new complaints.  Patient was up by the sink.  He had just gone to the bathroom using her walker.  Assessment/Plan: Principal Problem:   Acute CHF (congestive heart failure) (HCC) Active Problems:   Hypothyroidism   DM (diabetes mellitus), type 2 with renal complications (HCC)   Anemia   Acute renal failure superimposed on stage 3 chronic kidney disease (HCC)   Essential hypertension   Pressure injury of skin  #1 acute on chronic renal failure.  She is status post tunneled dialysis catheter Case management and social worker helping to arrange outpatient hemodialysis she had her third hemodialysis yesterday which was June 10, 2019.  Her next hemodialysis on Monday patient just got her hemodialysis chair today but family requesting for her to be admitted to SNF.  Social worker is working on getting her into SNF  2.  Acute on chronic diastolic CHF continue hemodialysis which will help with fluid management.  Strict I/O  3.  Hypothyroidism continue levothyroxine labs are normal  4 type 2 diabetes mellitus continue sliding scale  5 anemia of chronic disease likely secondary to chronic kidney disease hemoglobin is stable and at baseline  continue iron therapy  6 hypertension uncontrolled continue amlodipine hydralazine and labetalol  7 hyperlipidemia continue atorvastatin   Code Status: Full  Severity of Illness: The appropriate patient status for this patient is INPATIENT. Inpatient status is judged to be reasonable and necessary in order to provide the required intensity of service to ensure the patient's safety. The patient's presenting symptoms, physical exam findings, and initial radiographic and laboratory data in the context of their chronic comorbidities is felt to place them at high risk for further clinical deterioration. Furthermore, it is not anticipated that the patient will be medically stable for discharge from the hospital within 2 midnights of admission. The following factors support the patient status of inpatient.    " The worrisome physical exam findings include hemodialysis. " The initial radiographic and laboratory data are worrisome because of hemodialysis. " The chronic co-morbidities include congestive heart failure chronic kidney disease end-stage renal disease diabetes hypothyroidism anemia of chronic disease. Expected Discharge Plan: Skilled Nursing Facility Barriers to Discharge: Ship broker, Continued Medical Work up   * I certify that at the point of admission it is my clinical judgment that the patient will require inpatient hospital care spanning beyond 2 midnights from the point of admission due to high intensity of service, high risk for further deterioration and high frequency of surveillance required.*    Family Communication: None at bedside  Disposition Plan: patient just got her hemodialysis chair now waiting for SNF Expected Discharge Plan: [place] Barriers to Discharge: Ship broker, Continued Medical Work up   Consultants:  Nephrology  Procedures:  Hemodialysis  Antimicrobials:  None  DVT prophylaxis: Heparin    Objective: Vitals:   06/14/19 0036 06/14/19 0545 06/14/19 0550 06/14/19 0827  BP: 134/90  (!) 160/58 (!) 156/54  Pulse: 70  74 74  Resp: 16  17 20   Temp: 98.6 F (37 C)  98.3 F (36.8 C) (!) 97.4 F (36.3 C)  TempSrc: Oral  Oral Oral  SpO2: 94%  99% 98%  Weight:  87.3 kg    Height:        Intake/Output Summary (Last 24 hours) at 06/14/2019 0917 Last data filed at 06/14/2019 0830 Gross per 24 hour  Intake 600 ml  Output 3001 ml  Net -2401 ml   Filed Weights   06/13/19 0345 06/13/19 0715 06/14/19 0545  Weight: 90.9 kg 104.5 kg 87.3 kg   Body mass index is 34.1 kg/m.  Exam:  . General: 78 y.o. year-old female well developed well nourished in no acute distress.  Alert and oriented x3. . Cardiovascular: Regular rate and rhythm with no rubs or gallops.  No thyromegaly or JVD noted.   Marland Kitchen Respiratory: Clear to auscultation with no wheezes or rales. Good inspiratory effort. . Abdomen: Soft nontender nondistended with normal bowel sounds x4 quadrants. . Musculoskeletal: Positive lower extremity edema. 2/4 pulses in all 4 extremities. . Skin: No ulcerative lesions noted or rashes, . Psychiatry: Mood is appropriate for condition and setting    Data Reviewed: CBC: Recent Labs  Lab 06/08/19 0327 06/08/19 1903 06/09/19 0419 06/10/19 0809 06/13/19 0458  WBC 19.3* 18.6* 16.9* 20.2* 20.8*  HGB 7.5* 8.1* 8.1* 7.7* 7.7*  HCT 24.2* 26.3* 26.4* 26.4* 26.1*  MCV 84.3 84.6 84.1 88.6 87.0  PLT 389 370 363 320 378   Basic Metabolic Panel: Recent Labs  Lab 06/08/19 0327 06/08/19 1903 06/09/19 0419 06/10/19 0809 06/13/19 0458  NA 136 136 137 136 132*  K 4.4 4.2 3.8 3.8 3.5  CL 104 104 101 99 96*  CO2 18* 17* 22 24 23   GLUCOSE 89 154* 97 96 95  BUN 93* 97* 47* 31* 34*  CREATININE 6.21* 6.25* 3.84* 3.66* 4.50*  CALCIUM 8.4* 8.5* 8.3* 8.3* 8.4*  MG 1.6*  --  1.7  --   --   PHOS 6.3* 7.3* 4.7* 3.8 3.8   GFR: Estimated Creatinine Clearance: 11.1 mL/min (A) (by C-G formula  based on SCr of 4.5 mg/dL (H)). Liver Function Tests: Recent Labs  Lab 06/08/19 0327 06/08/19 1903 06/09/19 0419 06/10/19 0809 06/13/19 0458  ALBUMIN 1.6* 1.7* 1.8* 1.8* 1.7*   No results for input(s): LIPASE, AMYLASE in the last 168 hours. No results for input(s): AMMONIA in the last 168 hours. Coagulation Profile: Recent Labs  Lab 06/08/19 0327  INR 1.3*   Cardiac Enzymes: No results for input(s): CKTOTAL, CKMB, CKMBINDEX, TROPONINI in the last 168 hours. BNP (last 3 results) No results for input(s): PROBNP in the last 8760 hours. HbA1C: No results for input(s): HGBA1C in the last 72 hours. CBG: Recent Labs  Lab 06/13/19 0555 06/13/19 1130 06/13/19 1631 06/13/19 2107 06/14/19 0601  GLUCAP 91 111* 96 97 93   Lipid Profile: No results for input(s): CHOL, HDL, LDLCALC, TRIG, CHOLHDL, LDLDIRECT in the last 72 hours. Thyroid Function Tests: No results for input(s): TSH, T4TOTAL, FREET4, T3FREE, THYROIDAB in the last 72 hours. Anemia Panel: No results for input(s): VITAMINB12, FOLATE, FERRITIN, TIBC, IRON, RETICCTPCT in the last 72 hours. Urine analysis:    Component Value Date/Time   COLORURINE YELLOW 05/31/2019 0029  APPEARANCEUR CLEAR 05/31/2019 0029   APPEARANCEUR Cloudy 12/14/2013 1727   LABSPEC 1.020 05/31/2019 0029   LABSPEC 1.012 12/14/2013 1727   PHURINE 6.0 05/31/2019 0029   GLUCOSEU NEGATIVE 05/31/2019 0029   GLUCOSEU NEGATIVE 07/21/2017 1001   HGBUR SMALL (A) 05/31/2019 0029   BILIRUBINUR NEGATIVE 05/31/2019 0029   BILIRUBINUR Negative 12/14/2013 1727   KETONESUR 15 (A) 05/31/2019 0029   PROTEINUR >300 (A) 05/31/2019 0029   UROBILINOGEN 0.2 07/21/2017 1001   NITRITE NEGATIVE 05/31/2019 0029   LEUKOCYTESUR NEGATIVE 05/31/2019 0029   LEUKOCYTESUR 3+ 12/14/2013 1727   Sepsis Labs: @LABRCNTIP (procalcitonin:4,lacticidven:4)  ) Recent Results (from the past 240 hour(s))  SARS CORONAVIRUS 2 (TAT 6-24 HRS) Nasopharyngeal Nasopharyngeal Swab      Status: None   Collection Time: 06/08/19  2:06 AM   Specimen: Nasopharyngeal Swab  Result Value Ref Range Status   SARS Coronavirus 2 NEGATIVE NEGATIVE Final    Comment: (NOTE) SARS-CoV-2 target nucleic acids are NOT DETECTED. The SARS-CoV-2 RNA is generally detectable in upper and lower respiratory specimens during the acute phase of infection. Negative results do not preclude SARS-CoV-2 infection, do not rule out co-infections with other pathogens, and should not be used as the sole basis for treatment or other patient management decisions. Negative results must be combined with clinical observations, patient history, and epidemiological information. The expected result is Negative. Fact Sheet for Patients: SugarRoll.be Fact Sheet for Healthcare Providers: https://www.woods-mathews.com/ This test is not yet approved or cleared by the [place] FDA and  has been authorized for detection and/or diagnosis of SARS-CoV-2 by FDA under an Emergency Use Authorization (EUA). This EUA will remain  in effect (meaning this test can be used) for the duration of the COVID-19 declaration under Section 56 4(b)(1) of the Act, 21 U.S.C. section 360bbb-3(b)(1), unless the authorization is terminated or revoked sooner. Performed at [facility], [place] [address] [place], [place] [address]   Surgical PCR screen     Status: None   Collection Time: 06/08/19  2:10 AM   Specimen: Nasal Mucosa; Nasal Swab  Result Value Ref Range Status   MRSA, PCR NEGATIVE NEGATIVE Final   Staphylococcus aureus NEGATIVE NEGATIVE Final    Comment: (NOTE) The Xpert SA Assay (FDA approved for NASAL specimens in patients 5 years of age and older), is one component of a comprehensive surveillance program. It is not intended to diagnose infection nor to guide or monitor treatment. Performed at [facility], [place] [address], [place], [place] [address]        Studies: No results found.  Scheduled Meds: . amLODipine  10 mg Oral Daily  . atorvastatin  40 mg Oral q1800  . Chlorhexidine Gluconate Cloth  6 each Topical Q0600  . Chlorhexidine Gluconate Cloth  6 each Topical Q0600  . darbepoetin (ARANESP) injection - NON-DIALYSIS  150 mcg Subcutaneous Q Sat-1800  . gabapentin  200 mg Oral QHS  . heparin  5,000 Units Subcutaneous Q8H  . hydrALAZINE  100 mg Oral Q8H  . insulin aspart  0-9 Units Subcutaneous TID WC  . labetalol  200 mg Oral BID  . levothyroxine  100 mcg Oral QAC breakfast    Continuous Infusions: . sodium chloride 10 mL/hr at 06/08/19 1235  . sodium chloride    . sodium chloride       LOS: 15 days     Cristal Deer, MD [facility]  To reach me or the doctor on call, go to: [email or web address] Password [email or web address]  06/14/2019, 9:17 AM

## 2019-06-14 NOTE — Progress Notes (Signed)
Patient has been accepted at [facility] in [place] on a TTS schedule with a second shift seat time. Renal Navigator notified by CSW that plan has been changed to SNF. Renal Navigator will follow up with CSW.  Alphonzo Cruise, [place] Renal Navigator [phone]

## 2019-06-15 ENCOUNTER — Encounter (HOSPITAL_COMMUNITY): Payer: Medicare Other

## 2019-06-15 LAB — URINALYSIS, ROUTINE W REFLEX MICROSCOPIC
Bilirubin Urine: NEGATIVE
Glucose, UA: 50 mg/dL — AB
Hgb urine dipstick: NEGATIVE
Ketones, ur: NEGATIVE mg/dL
Nitrite: NEGATIVE
Protein, ur: >=300 mg/dL — AB
Specific Gravity, Urine: 1.026 (ref 1.005–1.030)
WBC, UA: >50 WBC/hpf — ABNORMAL HIGH (ref 0–5)
pH: 5 (ref 5.0–8.0)

## 2019-06-15 LAB — CBC
HCT: 26.7 % — ABNORMAL LOW (ref 36.0–46.0)
Hemoglobin: 7.9 g/dL — ABNORMAL LOW (ref 12.0–15.0)
MCH: 26 pg (ref 26.0–34.0)
MCHC: 29.6 g/dL — ABNORMAL LOW (ref 30.0–36.0)
MCV: 87.8 fL (ref 80.0–100.0)
Platelets: 234 10*3/uL (ref 150–400)
RBC: 3.04 MIL/uL — ABNORMAL LOW (ref 3.87–5.11)
RDW: 16.9 % — ABNORMAL HIGH (ref 11.5–15.5)
WBC: 17.4 10*3/uL — ABNORMAL HIGH (ref 4.0–10.5)
nRBC: 0.3 % — ABNORMAL HIGH (ref 0.0–0.2)

## 2019-06-15 LAB — LACTIC ACID, PLASMA: Lactic Acid, Venous: 0.8 mmol/L (ref 0.5–1.9)

## 2019-06-15 LAB — GLUCOSE, CAPILLARY
Glucose-Capillary: 103 mg/dL — ABNORMAL HIGH (ref 70–99)
Glucose-Capillary: 120 mg/dL — ABNORMAL HIGH (ref 70–99)
Glucose-Capillary: 104 mg/dL — ABNORMAL HIGH (ref 70–99)
Glucose-Capillary: 96 mg/dL (ref 70–99)

## 2019-06-15 LAB — SARS CORONAVIRUS 2 (TAT 6-24 HRS): SARS Coronavirus 2: NEGATIVE

## 2019-06-15 MED ORDER — VANCOMYCIN HCL 10 G IV SOLR
2000.0000 mg | Freq: Once | INTRAVENOUS | Status: AC
  Administered 2019-06-15: 2000 mg via INTRAVENOUS
  Filled 2019-06-15: qty 2000

## 2019-06-15 MED ORDER — VANCOMYCIN HCL IN DEXTROSE 1-5 GM/200ML-% IV SOLN
1000.0000 mg | INTRAVENOUS | Status: DC
  Administered 2019-06-16 – 2019-06-23 (×4): 1000 mg via INTRAVENOUS
  Filled 2019-06-15 (×4): qty 200

## 2019-06-15 MED ORDER — SODIUM CHLORIDE 0.9 % IV SOLN
2.0000 g | Freq: Once | INTRAVENOUS | Status: AC
  Administered 2019-06-15: 2 g via INTRAVENOUS
  Filled 2019-06-15: qty 2

## 2019-06-15 MED ORDER — SODIUM CHLORIDE 0.9 % IV SOLN
2.0000 g | INTRAVENOUS | Status: DC
  Administered 2019-06-16 – 2019-06-23 (×4): 2 g via INTRAVENOUS
  Filled 2019-06-15 (×6): qty 2

## 2019-06-15 NOTE — Progress Notes (Addendum)
Patient COVID test  completed  Low grade temp with new orders for blood culture and urinalysis with  no urine or urge to urinate.

## 2019-06-15 NOTE — Progress Notes (Signed)
Occupational Therapy Treatment Patient Details Name: Taylor Tyler MRN: [id number] DOB: [birth year] Today's Date: 06/15/2019    History of present illness 78 y.o. female admitted 05/30/19 after fall at home, laying on the floor at least 8 hours until found by son, also with worsening SOB and LE edema. VQ scan with low probability for PE. Worked up for HF. Other PMH includes CKD III, DM2, DJD s/p lumbar sx (2018), arthritis.   OT comments  Pt with much improved cognition compared to 11/9. Performed UB dressing and grooming at sink with supervision and ambulated in room with RW and min guard assist. Pt willing to remain up in chair at end of session. Continue to recommend ST rehab in SNF as cognition is not completely back to baseline, pt is a fall risk and has decreased activity tolerance from prolonged hospitalization.   Follow Up Recommendations  SNF;Supervision/Assistance - 24 hour    Equipment Recommendations  None recommended by OT    Recommendations for Other Services      Precautions / Restrictions Precautions Precautions: Fall Restrictions Weight Bearing Restrictions: No       Mobility Bed Mobility Overal bed mobility: Needs Assistance Bed Mobility: Rolling;Sidelying to Sit Rolling: Modified independent (Device/Increase time) Sidelying to sit: Supervision       General bed mobility comments: heavy reliance on rail, increased time  Transfers Overall transfer level: Needs assistance Equipment used: Rolling walker (2 wheeled) Transfers: Sit to/from Stand Sit to Stand: Supervision         General transfer comment: verbal cues for hand placement, uses momentum    Balance Overall balance assessment: Needs assistance   Sitting balance-Leahy Scale: Good     Standing balance support: No upper extremity supported Standing balance-Leahy Scale: Fair Standing balance comment: standing at sink                           ADL either performed or assessed  with clinical judgement   ADL Overall ADL's : Needs assistance/impaired     Grooming: Wash/dry hands;Standing;Supervision/safety           Upper Body Dressing : Set up;Sitting Upper Body Dressing Details (indicate cue type and reason): front opening gown     Toilet Transfer: Ambulation;RW;Min guard           Functional mobility during ADLs: Min guard;Rolling walker       Vision       Perception     Praxis      Cognition Arousal/Alertness: Awake/alert Behavior During Therapy: WFL for tasks assessed/performed Overall Cognitive Status: Impaired/Different from baseline Area of Impairment: Memory;Problem solving                     Memory: Decreased short-term memory       Problem Solving: Slow processing;Requires verbal cues General Comments: pt aware she will be going to SNF for further rehab prior to going home, seems to now appreciate that HD is long term        Exercises     Shoulder Instructions       General Comments      Pertinent Vitals/ Pain       Pain Assessment: Faces Faces Pain Scale: No hurt  Home Living  Prior Functioning/Environment              Frequency  Min 2X/week        Progress Toward Goals  OT Goals(current goals can now be found in the care plan section)  Progress towards OT goals: Progressing toward goals  Acute Rehab OT Goals Patient Stated Goal: get stronger and return home OT Goal Formulation: With patient Time For Goal Achievement: 06/27/19 Potential to Achieve Goals: Good  Plan Discharge plan remains appropriate    Co-evaluation                 AM-PAC OT "6 Clicks" Daily Activity     Outcome Measure   Help from another person eating meals?: None Help from another person taking care of personal grooming?: A Little Help from another person toileting, which includes using toliet, bedpan, or urinal?: A Little Help from another  person bathing (including washing, rinsing, drying)?: A Little Help from another person to put on and taking off regular upper body clothing?: A Little Help from another person to put on and taking off regular lower body clothing?: A Little 6 Click Score: 19    End of Session Equipment Utilized During Treatment: Rolling walker  OT Visit Diagnosis: Unsteadiness on feet (R26.81);Other symptoms and signs involving cognitive function;Other abnormalities of gait and mobility (R26.89);Muscle weakness (generalized) (M62.81)   Activity Tolerance Patient tolerated treatment well   Patient Left in chair;with call bell/phone within reach;with chair alarm set   Nurse Communication          Time: 0240-9735 OT Time Calculation (min): 30 min  Charges: OT General Charges $OT Visit: 1 Visit OT Treatments $Self Care/Home Management : 23-37 mins  Nestor Lewandowsky, OTR/L Acute Rehabilitation Services Pager: [phone] Office: [phone]   [name] 06/15/2019, 12:02 PM

## 2019-06-15 NOTE — TOC Progression Note (Signed)
Transition of Care [facility]) - Progression Note    Patient Details  Name: Taylor Tyler MRN: [id number] Date of Birth: 06/13/1941  Transition of Care [facility]) CM/SW [place], [place] Phone Number: [phone] 06/15/2019, 1:53 PM  Clinical Narrative:     CSW updated patient's son Taylor Tyler on bed offer of [facility] and informed him this is the only bed offer at this time and facility can transport patient to dialysis. CSW informed him that CSW and renal navigator Terri Piedra have been working on ensuring facility could transport patient and informed him of her dialysis time of 9am and sit time of 9:20 am TTS at [facility] in [place] off of [address]. CSW provided Taylor Tyler with [employer] address and phone number  and answered any questions at this time.   [name] has started Ship broker with [employer], pending auth at this time.    Expected Discharge Plan: Skilled Nursing Facility Barriers to Discharge: Ship broker, Continued Medical Work up  Expected Discharge Plan and Services Expected Discharge Plan: [place] In-house Referral: NA Discharge Planning Services: CM Consult Post Acute Care Choice: [place] Living arrangements for the past 2 months: Single Family Home                 DME Arranged: (NA)         HH Arranged: RN, PT [place] Agency: [place] Date [facility] Agency Contacted: 06/10/19 Time [place]: [id number] Representative spoke with at [place]: cory   Social Determinants of Health ([place]) Interventions    Readmission Risk Interventions No flowsheet data found.

## 2019-06-15 NOTE — Progress Notes (Signed)
Urine culture collected

## 2019-06-15 NOTE — Progress Notes (Signed)
[facility] NEPHROLOGY PROGRESS NOTE  Assessment/ Plan: Pt is a 78 y.o. yo female  with history of DM, HTN, CHF, anemia, HLD,CKD 4 admitted with CHF exacerbation, consulted for AKI on CKD.  #AKI on CKD stage IV, nonoliguric now progressed to ESRD: Baseline creatinine seems to be around3. AKI likely due to CHF exacerbation.  Pt has been accepted to [facility] in [place] TTS. Pt currently awaiting SNF placement. Will get next HD treatment on 11/12 to get her on o/p schedule. hsa not had renal labs since 11/8, values were largely normal and stable prior to that.  -Started dialysis on 06/08/2019. Placement of right IJ TDC and left brachiocephalic AV fistula creation by Dr. Donnetta Hutching on 11/4.  # Anemia due to CKD: Iron saturation 17%. Continue IV iron. Continue ESA. Monitor CBC.  # Secondary hyperparathyroidism: PTH level99. Phosphorus level improved with HD.    # HTN/volume: continues to be elevated.  hydralazine to 100 3 times daily.  Continue amlodipine and labetalol.   Subjective:  Patient feels good today again. Only complaint was some 'aching' in her legs last night in the spot where she is consistently tender to palpation above her ankles.   Objective Physical Exam: General:NAD, comfortable. Laying in bed.   Heart:RRR, s1s2 nl Lungs:clear b/l, no crackle Extremities:trace pitting edema in LE b/l.   Legs directly proximal to ankles are TTP bilaterally.  Dialysis Access: L AV fistula, right tunneled HD cath  Vital signs in last 24 hours: Vitals:   06/14/19 1801 06/14/19 2005 06/15/19 0513 06/15/19 0521  BP: (!) 154/56 (!) 162/54  (!) 153/55  Pulse: 70 70  73  Resp: 18 18  18   Temp: 97.8 F (36.6 C) 98.3 F (36.8 C)  99.9 F (37.7 C)  TempSrc: Oral Oral  Oral  SpO2: 100% 98%  98%  Weight:   87.7 kg   Height:       Weight change: -16.8 kg  Intake/Output Summary (Last 24 hours) at 06/15/2019 0709 Last data filed at 06/14/2019 2135 Gross  per 24 hour  Intake 600 ml  Output 175 ml  Net 425 ml       Labs: Basic Metabolic Panel: Recent Labs  Lab 06/09/19 0419 06/10/19 0809 06/13/19 0458  NA 137 136 132*  K 3.8 3.8 3.5  CL 101 99 96*  CO2 22 24 23   GLUCOSE 97 96 95  BUN 47* 31* 34*  CREATININE 3.84* 3.66* 4.50*  CALCIUM 8.3* 8.3* 8.4*  PHOS 4.7* 3.8 3.8   Liver Function Tests: Recent Labs  Lab 06/09/19 0419 06/10/19 0809 06/13/19 0458  ALBUMIN 1.8* 1.8* 1.7*   No results for input(s): LIPASE, AMYLASE in the last 168 hours. No results for input(s): AMMONIA in the last 168 hours. CBC: Recent Labs  Lab 06/08/19 1903 06/09/19 0419 06/10/19 0809 06/13/19 0458  WBC 18.6* 16.9* 20.2* 20.8*  HGB 8.1* 8.1* 7.7* 7.7*  HCT 26.3* 26.4* 26.4* 26.1*  MCV 84.6 84.1 88.6 87.0  PLT 370 363 320 242   Cardiac Enzymes: No results for input(s): CKTOTAL, CKMB, CKMBINDEX, TROPONINI in the last 168 hours. CBG: Recent Labs  Lab 06/14/19 0601 06/14/19 1152 06/14/19 1642 06/14/19 2122 06/15/19 0637  GLUCAP 93 101* 114* 90 103*    Iron Studies: No results for input(s): IRON, TIBC, TRANSFERRIN, FERRITIN in the last 72 hours. Studies/Results: No results found.  Medications: Infusions: . sodium chloride 10 mL/hr at 06/08/19 1235  . sodium chloride    . sodium chloride  Scheduled Medications: . amLODipine  10 mg Oral Daily  . atorvastatin  40 mg Oral q1800  . Chlorhexidine Gluconate Cloth  6 each Topical Q0600  . Chlorhexidine Gluconate Cloth  6 each Topical Q0600  . darbepoetin (ARANESP) injection - NON-DIALYSIS  150 mcg Subcutaneous Q Sat-1800  . gabapentin  200 mg Oral QHS  . heparin  5,000 Units Subcutaneous Q8H  . hydrALAZINE  100 mg Oral Q8H  . insulin aspart  0-9 Units Subcutaneous TID WC  . labetalol  200 mg Oral BID  . levothyroxine  100 mcg Oral QAC breakfast    have reviewed scheduled and prn medications.   [name] 06/15/2019,7:09 AM  LOS: 16 days  Pager: [phone]

## 2019-06-15 NOTE — Progress Notes (Signed)
Pharmacy Antibiotic Note  Taylor Tyler is a 78 y.o. female admitted on 05/30/2019 due to  a fall at home and shortness of breath. Tonight 06/15/19  Pharmacy has been consulted for vancomycin and cefepime for fever in new dialysis patient.  WBC 17.4 <20.8,  Tc 101.3, Tm 103.1 Last HD done 11/9, next HD planned 11/12  to get her on o/p schedule  11/11 Blood Cx x2: sent 11/11 COVID: negative  11/4 srugical PCR: MRSA PCR negative, SA PCR negative  Plan:  Cefepime 2gm IV x1 tonight then 2g qHD TTS Vancomycin 2 g IV x1 tonight then 1 g qHD TTS Monitor clinical status, renal function and culture results daily.    Height: 5\' 3"  (160 cm) Weight: 193 lb 6.4 oz (87.7 kg) IBW/kg (Calculated) : 52.4  Temp (24hrs), Avg:100.8 F (38.2 C), Min:98.3 F (36.8 C), Max:103.1 F (39.5 C)  Recent Labs  Lab 06/09/19 0419 06/10/19 0809 06/13/19 0458 06/15/19 1827  WBC 16.9* 20.2* 20.8* 17.4*  CREATININE 3.84* 3.66* 4.50*  --   LATICACIDVEN  --   --   --  0.8    Estimated Creatinine Clearance: 11.2 mL/min (A) (by C-G formula based on SCr of 4.5 mg/dL (H)).    Allergies  Allergen Reactions  . Clams [Shellfish Allergy] Swelling and Other (See Comments)    THROAT SWELLS NECK TURNS RED  . Hydralazine Other (See Comments)    CHEST TIGHTNESS Patient has tolerated multiple doses of hydralazine since allergy was listed   . Norvasc [Amlodipine Besylate] Swelling    Leg edema   . Milk-Related Compounds Diarrhea   Thank you for allowing pharmacy to be a part of this patient's care. Nicole Cella, RPh Clinical Pharmacist Please check AMION for all [place] phone numbers After 10:00 PM, call [place] [phone] 06/15/2019 7:05 PM

## 2019-06-15 NOTE — Progress Notes (Signed)
Patient is drowsy with a temp 103.1 trending down 102.6 after given tylenol rectally,  MD aware Septic bundle initiated per protocol.

## 2019-06-15 NOTE — Progress Notes (Signed)
PT Cancellation Note  Patient Details Name: Taylor Tyler MRN: [id number] DOB: 12-11-1940   Cancelled Treatment:    Reason Eval/Treat Not Completed: Patient declined, no reason specified Patient refused to participate in therapy at this time and reports "I just got back to bed". Pt encouraged to participate. PT will follow acutely.    Earney Navy, PTA Acute Rehabilitation Services Pager: [phone] Office: [phone]   06/15/2019, 2:53 PM

## 2019-06-15 NOTE — Plan of Care (Signed)

## 2019-06-15 NOTE — Progress Notes (Addendum)
PROGRESS NOTE    Taylor Tyler   GYI:[id number]  DOB: 1941-01-02  DOA: 05/30/2019 PCP: McLean-Scocuzza, Nino Glow, MD   Brief Narrative:  Taylor Tyler is a 78 yo with diabetes mellitus type 2 presently not on medication, chronic diastolic hypertension, chronic kidney disease stage III, anemia, hypothyroidism, hyperlipidemia.   She presented to the ED on 10/26 for dyspnea after a fall out of bed the night before and being on the floor for about 8 hrs. Her son found her in the AM. She was short of breath for at least 2 days.   In ED, noted to be wheezing. Suspected to have acute diastolic CHF and started on Lasix.  Also noted to have AKI with Cr of 5.74 and K of 5.3.  Past Cr 2.64.   Subjective: She has no complaints    Assessment & Plan:   Principal Problem:  Acute diastolic CHF (congestive heart failure)   Acute on chronic kidney disease stage 4 >> ESRD Metabolic acidosis - nephrology consulted - did not diurese well - 11/4 - tunneled dialysis cath placed and underwent diuresis - dialysis spot has been found- her schedule will be Tu/Th/Sat  Active Problems: Leukocytosis, fever - temp was 100 this AM - last WBC on 11/9 was 20- will recheck CBC and obtain differential - will order blood cultures and a UA - dialysis cath appears clean and non-tender Addendum: notified of temp of 103 at around  6 pm. Urine culture ordered. Blood cultures sent earlier today. Start Vanc and Cefepime as she is a dialysis patient and high risk for bacteremia.   Hypothyroidism  - cont Synthroid  DM? - last A1c was 5.2- not on medication at home - sugars have been normal - stop fingerstick  H/o lumbar wound infection s/p surgery - managed by ID in June, 2020- wound healed   Pressure ulcer- stage 2 left buttock - cont wound care   HTN - on Norvasc, Labetalol, Hydralazine  HLD - Lipitor    Time spent in minutes: 45 min in reviewing prior chart- spoke with nephrology resident DVT  prophylaxis:Heparin Code Status: Full code Family Communication:   Disposition Plan: SNF Consultants:   Nephrology  Vascular surgery Procedures:    11/4 tunneled cath Antimicrobials:  Anti-infectives (From admission, onward)   Start     Dose/Rate Route Frequency Ordered Stop   06/08/19 0600  cefUROXime (ZINACEF) 1.5 g in sodium chloride 0.9 % 100 mL IVPB     1.5 g 200 mL/hr over 30 Minutes Intravenous On call to O.R. 06/07/19 1519 06/08/19 1328       Objective: Vitals:   06/14/19 2005 06/15/19 0513 06/15/19 0521 06/15/19 0744  BP: (!) 162/54  (!) 153/55 (!) 164/54  Pulse: 70  73 74  Resp: 18  18 20   Temp: 98.3 F (36.8 C)  99.9 F (37.7 C) 100 F (37.8 C)  TempSrc: Oral  Oral Oral  SpO2: 98%  98% 97%  Weight:  87.7 kg    Height:        Intake/Output Summary (Last 24 hours) at 06/15/2019 1205 Last data filed at 06/15/2019 1023 Gross per 24 hour  Intake 720 ml  Output 175 ml  Net 545 ml   Filed Weights   06/13/19 0715 06/14/19 0545 06/15/19 0513  Weight: 104.5 kg 87.3 kg 87.7 kg    Examination: General exam: Appears comfortable  HEENT: PERRLA, oral mucosa moist, no sclera icterus or thrush Respiratory system: Clear to auscultation. Respiratory effort  normal. Cardiovascular system: S1 & S2 heard, RRR.   Gastrointestinal system: Abdomen soft, non-tender, nondistended. Normal bowel sounds. Central nervous system: Alert and oriented. No focal neurological deficits. Extremities: No cyanosis, clubbing or edema Psychiatry:  Mood & affect appropriate.     Data Reviewed: I have personally reviewed following labs and imaging studies  CBC: Recent Labs  Lab 06/08/19 1903 06/09/19 0419 06/10/19 0809 06/13/19 0458  WBC 18.6* 16.9* 20.2* 20.8*  HGB 8.1* 8.1* 7.7* 7.7*  HCT 26.3* 26.4* 26.4* 26.1*  MCV 84.6 84.1 88.6 87.0  PLT 370 363 320 469   Basic Metabolic Panel: Recent Labs  Lab 06/08/19 1903 06/09/19 0419 06/10/19 0809 06/13/19 0458  NA 136 137  136 132*  K 4.2 3.8 3.8 3.5  CL 104 101 99 96*  CO2 17* 22 24 23   GLUCOSE 154* 97 96 95  BUN 97* 47* 31* 34*  CREATININE 6.25* 3.84* 3.66* 4.50*  CALCIUM 8.5* 8.3* 8.3* 8.4*  MG  --  1.7  --   --   PHOS 7.3* 4.7* 3.8 3.8   GFR: Estimated Creatinine Clearance: 11.2 mL/min (A) (by C-G formula based on SCr of 4.5 mg/dL (H)). Liver Function Tests: Recent Labs  Lab 06/08/19 1903 06/09/19 0419 06/10/19 0809 06/13/19 0458  ALBUMIN 1.7* 1.8* 1.8* 1.7*   No results for input(s): LIPASE, AMYLASE in the last 168 hours. No results for input(s): AMMONIA in the last 168 hours. Coagulation Profile: No results for input(s): INR, PROTIME in the last 168 hours. Cardiac Enzymes: No results for input(s): CKTOTAL, CKMB, CKMBINDEX, TROPONINI in the last 168 hours. BNP (last 3 results) No results for input(s): PROBNP in the last 8760 hours. HbA1C: No results for input(s): HGBA1C in the last 72 hours. CBG: Recent Labs  Lab 06/14/19 1152 06/14/19 1642 06/14/19 2122 06/15/19 0637 06/15/19 1125  GLUCAP 101* 114* 90 103* 120*   Lipid Profile: No results for input(s): CHOL, HDL, LDLCALC, TRIG, CHOLHDL, LDLDIRECT in the last 72 hours. Thyroid Function Tests: No results for input(s): TSH, T4TOTAL, FREET4, T3FREE, THYROIDAB in the last 72 hours. Anemia Panel: No results for input(s): VITAMINB12, FOLATE, FERRITIN, TIBC, IRON, RETICCTPCT in the last 72 hours. Urine analysis:    Component Value Date/Time   COLORURINE YELLOW 05/31/2019 0029   APPEARANCEUR CLEAR 05/31/2019 0029   APPEARANCEUR Cloudy 12/14/2013 1727   LABSPEC 1.020 05/31/2019 0029   LABSPEC 1.012 12/14/2013 1727   PHURINE 6.0 05/31/2019 0029   GLUCOSEU NEGATIVE 05/31/2019 0029   GLUCOSEU NEGATIVE 07/21/2017 1001   HGBUR SMALL (A) 05/31/2019 0029   BILIRUBINUR NEGATIVE 05/31/2019 0029   BILIRUBINUR Negative 12/14/2013 1727   KETONESUR 15 (A) 05/31/2019 0029   PROTEINUR >300 (A) 05/31/2019 0029   UROBILINOGEN 0.2 07/21/2017  1001   NITRITE NEGATIVE 05/31/2019 0029   LEUKOCYTESUR NEGATIVE 05/31/2019 0029   LEUKOCYTESUR 3+ 12/14/2013 1727   Sepsis Labs: @LABRCNTIP (procalcitonin:4,lacticidven:4) ) Recent Results (from the past 240 hour(s))  SARS CORONAVIRUS 2 (TAT 6-24 HRS) Nasopharyngeal Nasopharyngeal Swab     Status: None   Collection Time: 06/08/19  2:06 AM   Specimen: Nasopharyngeal Swab  Result Value Ref Range Status   SARS Coronavirus 2 NEGATIVE NEGATIVE Final    Comment: (NOTE) SARS-CoV-2 target nucleic acids are NOT DETECTED. The SARS-CoV-2 RNA is generally detectable in upper and lower respiratory specimens during the acute phase of infection. Negative results do not preclude SARS-CoV-2 infection, do not rule out co-infections with other pathogens, and should not be used as the sole basis for  treatment or other patient management decisions. Negative results must be combined with clinical observations, patient history, and epidemiological information. The expected result is Negative. Fact Sheet for Patients: SugarRoll.be Fact Sheet for Healthcare Providers: https://www.woods-mathews.com/ This test is not yet approved or cleared by the [place] FDA and  has been authorized for detection and/or diagnosis of SARS-CoV-2 by FDA under an Emergency Use Authorization (EUA). This EUA will remain  in effect (meaning this test can be used) for the duration of the COVID-19 declaration under Section 56 4(b)(1) of the Act, 21 U.S.C. section 360bbb-3(b)(1), unless the authorization is terminated or revoked sooner. Performed at [facility], [place] [address], [name], [place] [address]   Surgical PCR screen     Status: None   Collection Time: 06/08/19  2:10 AM   Specimen: Nasal Mucosa; Nasal Swab  Result Value Ref Range Status   MRSA, PCR NEGATIVE NEGATIVE Final   Staphylococcus aureus NEGATIVE NEGATIVE Final    Comment: (NOTE) The Xpert SA Assay (FDA  approved for NASAL specimens in patients 45 years of age and older), is one component of a comprehensive surveillance program. It is not intended to diagnose infection nor to guide or monitor treatment. Performed at [facility], [place] [address] [place], [place] [address]          Radiology Studies: No results found.    Scheduled Meds: . amLODipine  10 mg Oral Daily  . atorvastatin  40 mg Oral q1800  . Chlorhexidine Gluconate Cloth  6 each Topical Q0600  . Chlorhexidine Gluconate Cloth  6 each Topical Q0600  . darbepoetin (ARANESP) injection - NON-DIALYSIS  150 mcg Subcutaneous Q Sat-1800  . gabapentin  200 mg Oral QHS  . heparin  5,000 Units Subcutaneous Q8H  . hydrALAZINE  100 mg Oral Q8H  . insulin aspart  0-9 Units Subcutaneous TID WC  . labetalol  200 mg Oral BID  . levothyroxine  100 mcg Oral QAC breakfast   Continuous Infusions: . sodium chloride 10 mL/hr at 06/08/19 1235  . sodium chloride    . sodium chloride       LOS: 16 days      Debbe Odea, MD [facility] Pager: [email or web address] Password [email or web address] 06/15/2019, 12:05 PM

## 2019-06-16 DIAGNOSIS — Z981 Arthrodesis status: Secondary | ICD-10-CM

## 2019-06-16 DIAGNOSIS — Z91011 Allergy to milk products: Secondary | ICD-10-CM

## 2019-06-16 DIAGNOSIS — A419 Sepsis, unspecified organism: Secondary | ICD-10-CM

## 2019-06-16 DIAGNOSIS — Z91013 Allergy to seafood: Secondary | ICD-10-CM

## 2019-06-16 DIAGNOSIS — Z888 Allergy status to other drugs, medicaments and biological substances status: Secondary | ICD-10-CM

## 2019-06-16 DIAGNOSIS — I509 Heart failure, unspecified: Secondary | ICD-10-CM

## 2019-06-16 DIAGNOSIS — T8463XA Infection and inflammatory reaction due to internal fixation device of spine, initial encounter: Secondary | ICD-10-CM

## 2019-06-16 LAB — CBC
HCT: 25.1 % — ABNORMAL LOW (ref 36.0–46.0)
Hemoglobin: 7.3 g/dL — ABNORMAL LOW (ref 12.0–15.0)
MCH: 25.5 pg — ABNORMAL LOW (ref 26.0–34.0)
MCHC: 29.1 g/dL — ABNORMAL LOW (ref 30.0–36.0)
MCV: 87.8 fL (ref 80.0–100.0)
Platelets: 239 10*3/uL (ref 150–400)
RBC: 2.86 MIL/uL — ABNORMAL LOW (ref 3.87–5.11)
RDW: 16.6 % — ABNORMAL HIGH (ref 11.5–15.5)
WBC: 16.2 10*3/uL — ABNORMAL HIGH (ref 4.0–10.5)
nRBC: 0.2 % (ref 0.0–0.2)

## 2019-06-16 LAB — RENAL FUNCTION PANEL
Albumin: 1.7 g/dL — ABNORMAL LOW (ref 3.5–5.0)
Anion gap: 13 (ref 5–15)
BUN: 40 mg/dL — ABNORMAL HIGH (ref 8–23)
CO2: 23 mmol/L (ref 22–32)
Calcium: 8.2 mg/dL — ABNORMAL LOW (ref 8.9–10.3)
Chloride: 95 mmol/L — ABNORMAL LOW (ref 98–111)
Creatinine, Ser: 5.82 mg/dL — ABNORMAL HIGH (ref 0.44–1.00)
GFR calc Af Amer: 8 mL/min — ABNORMAL LOW (ref >60)
GFR calc non Af Amer: 7 mL/min — ABNORMAL LOW (ref >60)
Glucose, Bld: 95 mg/dL (ref 70–99)
Phosphorus: 4.2 mg/dL (ref 2.5–4.6)
Potassium: 3.8 mmol/L (ref 3.5–5.1)
Sodium: 131 mmol/L — ABNORMAL LOW (ref 135–145)

## 2019-06-16 LAB — GLUCOSE, CAPILLARY
Glucose-Capillary: 92 mg/dL (ref 70–99)
Glucose-Capillary: 107 mg/dL — ABNORMAL HIGH (ref 70–99)
Glucose-Capillary: 90 mg/dL (ref 70–99)
Glucose-Capillary: 105 mg/dL — ABNORMAL HIGH (ref 70–99)

## 2019-06-16 MED ORDER — HEPARIN SODIUM (PORCINE) 1000 UNIT/ML IJ SOLN
INTRAMUSCULAR | Status: AC
  Administered 2019-06-16: 1800 [IU] via INTRAVENOUS_CENTRAL
  Filled 2019-06-16: qty 2

## 2019-06-16 MED ORDER — VANCOMYCIN HCL IN DEXTROSE 1-5 GM/200ML-% IV SOLN
INTRAVENOUS | Status: AC
  Administered 2019-06-16: 1000 mg via INTRAVENOUS
  Filled 2019-06-16: qty 200

## 2019-06-16 MED ORDER — HEPARIN SODIUM (PORCINE) 1000 UNIT/ML DIALYSIS: 20.0000 [IU]/kg | INTRAMUSCULAR | Status: DC | PRN

## 2019-06-16 MED ORDER — HEPARIN SODIUM (PORCINE) 1000 UNIT/ML IJ SOLN
INTRAMUSCULAR | Status: AC
  Administered 2019-06-16: 3400 [IU] via INTRAVENOUS_CENTRAL
  Filled 2019-06-16: qty 2

## 2019-06-16 NOTE — TOC Progression Note (Signed)
Transition of Care [facility]) - Progression Note    Patient Details  Name: Taylor Tyler MRN: [id number] Date of Birth: 10/18/1940  Transition of Care [facility]) CM/SW [place], [place] Phone Number: [phone] 06/16/2019, 1:31 PM  Clinical Narrative:     CSW spoke with [facility] reporting that [place] continues to pend at this time.   Expected Discharge Plan: Skilled Nursing Facility Barriers to Discharge: Ship broker, Continued Medical Work up  Expected Discharge Plan and Services Expected Discharge Plan: [place] In-house Referral: NA Discharge Planning Services: CM Consult Post Acute Care Choice: [place] Living arrangements for the past 2 months: Single Family Home                 DME Arranged: (NA)         HH Arranged: RN, PT [place] Agency: [place] Date [facility] Agency Contacted: 06/10/19 Time [place]: [id number] Representative spoke with at [place]: cory   Social Determinants of Health ([place]) Interventions    Readmission Risk Interventions No flowsheet data found.

## 2019-06-16 NOTE — Consult Note (Signed)
Chief Complaint   Chief Complaint  Patient presents with  . Shortness of Breath  . Fall    HPI   Consult requested by: Dr Wynelle Cleveland Reason for consult: Chronic lumbar wound infection - Proteus, nonhealing wound  HPI: Taylor Tyler is a 78 y.o. 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 Per nursing, for the last several days she has been noted to be febrile with Tmax 103.1 with noted purulent discharge from known nonhealing wound. Briefly, she underwent lumbar fusion by Dr Cyndy Freeze in 2018 and since has been in/out of the hospital on intermittent prolonged courses of antibiotics for chronic proteus infection with nonhealing wound. She has undergone at least four times lumbar wound wash outs by both Dr Kathyrn Sheriff and Dr Cyndy Freeze and placement of wound vac on two separate occasions. Unfortunately, despite these efforts, she continues to have an open wound. She had been followed by Dr Prince Rome with ID who recommended repeat I&D. A repeat MRI was obtained to assess the extent of the infection in October 2020 prior to deciding on any repeat surgery, however patient has no showed her appt in clinic multiple times to discuss the results. Due to her current fevers and known lumbar wound infection with discharge, we were called for consultation.  She appears slightly encephalopathic compared to normal baseline. She complains of moderate back pain. Denies LE symptoms.  Patient Active Problem List   Diagnosis Date Noted  . Pressure injury of skin 06/08/2019  . Acute CHF (congestive heart failure) ([place]) 05/30/2019  . Infection and inflammatory reaction due to internal fixation device of spine, initial encounter ([place]) 06/05/2018  . Neuropathy 01/08/2018  . Fatty liver 01/05/2018  . Chronic pain of both knees 01/05/2018  . Ventral hernia 01/05/2018  . CKD (chronic kidney disease) stage  4, GFR 15-29 ml/min (HCC) 07/21/2017  . Vitamin D deficiency 07/21/2017  . Essential hypertension   . Postoperative wound infection 04/02/2017  . Anemia 03/14/2017  . Acute blood loss anemia 03/14/2017  . Acute renal failure superimposed on stage 3 chronic kidney disease ([place]) 03/14/2017  . Obesity, Class III, BMI 40-49.9 (morbid obesity) ([place]) 02/27/2017  . DM (diabetes mellitus), type 2 with renal complications ([place]) 11/94/1740  . Status post lumbar surgery 02/24/2017  . CKD (chronic kidney disease) stage 3, GFR 30-59 ml/min 01/15/2017  . Hypothyroidism 01/15/2017  . Diverticulosis 01/15/2017  . HLD (hyperlipidemia) 01/15/2017  . Spinal stenosis of lumbar region 01/15/2017  . Disease of pancreas 07/21/2012    PMH: Past Medical History:  Diagnosis Date  . Acute blood loss anemia 03/14/2017  . Acute renal failure superimposed on stage 3 chronic kidney disease ([place]) 03/14/2017  . AKI (acute kidney injury) ([place]) 02/28/2017  . Altered mental status 03/29/2017  . Anemia   . Aortic atherosclerosis ([place])   . Arthritis    "joints might ache at times; not that bad" (06/10/2018)  . Bilateral lower extremity edema 03/04/2017  . Bradycardia   . Chronic kidney disease    ?? renal insufficiency,   . CKD (chronic kidney disease) stage 3, GFR 30-59 ml/min 01/15/2017   ?? renal insufficiency, which she thinks is coming from "all these medications"  . Diabetes mellitus without complication ([place])    diagnosed 4-5 yrs ago, 06/21/18- "that was years ago"  . Disease of pancreas   . Diverticulitis    s/p perforation and partial colectomy  01/27/14 with 3 benign lymph nodes   . Diverticulosis 01/15/2017  . DJD (degenerative joint disease)   . DM (diabetes mellitus), type 2 with renal complications ([place]) 5/80/9983  . Fatty liver   . Hypertension   . Hypothyroidism    "had radiation" (06/10/2018)  . Kidney stone   . Lethargy 02/28/2017  . Obesity, Class III, BMI 40-49.9 (morbid obesity) ([place]) 02/27/2017   . Pleural lipoma   . Postoperative wound infection 04/02/2017  . Spinal stenosis of lumbar region 01/15/2017  . Status post lumbar surgery   . Wound healing, delayed    back    PSH: Past Surgical History:  Procedure Laterality Date  . ABDOMINAL EXPOSURE N/A 02/24/2017   Procedure: ABDOMINAL EXPOSURE;  Surgeon: Angelia Mould, MD;  Location: [facility];  Service: Vascular;  Laterality: N/A;  . ABDOMINAL HYSTERECTOMY  1987   no h/o abnormal paps   . ANTERIOR LAT LUMBAR FUSION N/A 02/24/2017   Procedure: Lumbar three- five Anterior lateral lumbar interbody fusion;  Surgeon: Ditty, Kevan Ny, MD;  Location: [facility];  Service: Neurosurgery;  Laterality: N/A;  L3-5 Anterior lateral lumbar interbody fusion with removal of coflex at L3-4, L4-5  . ANTERIOR LUMBAR FUSION N/A 02/24/2017   Procedure: Stage 1: Lumbar five-Sacral one Anterior lumbar interbody fusion;  Surgeon: Ditty, Kevan Ny, MD;  Location: [facility];  Service: Neurosurgery;  Laterality: N/A;  Stage 1: L5-S1 Anterior lumbar interbody fusion  . APPENDECTOMY    . APPLICATION OF ROBOTIC ASSISTANCE FOR SPINAL PROCEDURE N/A 02/26/2017   Procedure: APPLICATION OF ROBOTIC ASSISTANCE FOR SPINAL PROCEDURE;  Surgeon: Ditty, Kevan Ny, MD;  Location: [facility];  Service: Neurosurgery;  Laterality: N/A;  . APPLICATION OF WOUND VAC N/A 03/30/2017   Procedure: APPLICATION OF WOUND VAC;  Surgeon: Ditty, Kevan Ny, MD;  Location: [facility];  Service: Neurosurgery;  Laterality: N/A;  . AV FISTULA PLACEMENT Left 06/08/2019   Procedure: Arteriovenous (Av) Fistula Creation;  Surgeon: Rosetta Posner, MD;  Location: [facility];  Service: Vascular;  Laterality: Left;  . BACK SURGERY  2013  . CATARACT EXTRACTION W/ INTRAOCULAR LENS IMPLANT Right   . CHOLECYSTECTOMY OPEN  1982  . COLON SURGERY  01/26/2014   desc.sigmoid colectomy and ventral hernia repair and splenic flexure mobilization  . COLON SURGERY    . DILATION AND CURETTAGE OF UTERUS    . HERNIA  REPAIR    . INSERTION OF DIALYSIS CATHETER Right 06/08/2019   Procedure: INSERTION OF DIALYSIS CATHETER RIGHT INTERNAL JUGULAR;  Surgeon: Rosetta Posner, MD;  Location: [facility];  Service: Vascular;  Laterality: Right;  . LUMBAR LAMINECTOMY WITH SPINOUS PROCESS PLATE 2 LEVEL N/A 3/82/5053   Procedure: LUMBAR LAMINECTOMY/DECOMPRESSION MICRODISCECTOMY CoFlex;  Surgeon: Faythe Ghee, MD;  Location: [facility];  Service: Neurosurgery;  Laterality: N/A;  Lumbar three-four,Lumbar Four-Five Laminectomy with Coflex  . LUMBAR WOUND DEBRIDEMENT N/A 03/30/2017   Procedure: Lumbar wound exploration/debridement, placement of wound vac;  Surgeon: Ditty, Kevan Ny, MD;  Location: [facility];  Service: Neurosurgery;  Laterality: N/A;  Lumbar wound exploration/debridement, placement of wound vac  . LUMBAR WOUND DEBRIDEMENT N/A 06/06/2018   Procedure: LUMBAR WOUND DEBRIDEMENT/EXPLORATION;  Surgeon: Consuella Lose, MD;  Location: [facility];  Service: Neurosurgery;  Laterality: N/A;  . LUMBAR WOUND DEBRIDEMENT N/A 06/22/2018   Procedure: SIMPLE INCISION AND DRAINAGE OF WOUND, APPLICATION OF WOUND VAC;  Surgeon: Consuella Lose, MD;  Location: [facility];  Service: Neurosurgery;  Laterality: N/A;  . PARTIAL COLECTOMY  01/27/14 diverticulitis and 3 benign lymph nodes [facility] Dr. Pat Patrick   . REDUCTION MAMMAPLASTY Bilateral 1992  . THROMBECTOMY BRACHIAL ARTERY Left 06/08/2019   Procedure: Thrombectomy Brachial Artery;  Surgeon: Rosetta Posner, MD;  Location: [facility];  Service: Vascular;  Laterality: Left;  . TUBAL LIGATION    . VENTRAL HERNIA REPAIR  2014    Medications Prior to Admission  Medication Sig Dispense Refill Last Dose  . acetaminophen (TYLENOL) 500 MG tablet Take 500 mg by mouth 2 (two) times daily as needed for mild pain.    Past Month at Unknown time  . amLODipine (NORVASC) 10 MG tablet Take 1 tablet (10 mg total) by mouth daily. 30 tablet 0 05/29/2019 at Unknown time  . cloNIDine (CATAPRES) 0.1 MG tablet Take 0.1  mg by mouth 2 (two) times daily.   05/29/2019  . ferrous sulfate 325 (65 FE) MG tablet Take 325 mg by mouth daily with breakfast.   05/29/2019  . furosemide (LASIX) 40 MG tablet Take 1 tablet (40 mg total) by mouth 2 (two) times daily. In am and with lunch as needed leg swelling. Further refills Dr. Johnney Ou (kidney doctor) 180 tablet 0 05/29/2019  . gabapentin (NEURONTIN) 300 MG capsule Take 1 capsule (300 mg total) by mouth at bedtime. 90 capsule 3 05/29/2019  . hydrALAZINE (APRESOLINE) 50 MG tablet Take 1 tablet (50 mg total) by mouth every 8 (eight) hours. 90 tablet 0 05/29/2019  . levothyroxine (SYNTHROID, LEVOTHROID) 100 MCG tablet Take 100 mcg by mouth daily before breakfast.   05/29/2019  . atorvastatin (LIPITOR) 40 MG tablet Take 1 tablet (40 mg total) by mouth daily at 6 PM. (Patient not taking: Reported on 05/31/2019) 90 tablet 3 Not Taking at Unknown time  . labetalol (NORMODYNE) 100 MG tablet Take 100 mg by mouth 2 (two) times daily.   05/29/2019 at 2000    SH: Social History   Tobacco Use  . Smoking status: Never Smoker  . Smokeless tobacco: Never Used  Substance Use Topics  . Alcohol use: Not Currently    Frequency: Never    Comment: "nothing since age 17" (06/10/2018)  . Drug use: Never    MEDS: Prior to Admission medications   Medication Sig Start Date End Date Taking? Authorizing Provider  acetaminophen (TYLENOL) 500 MG tablet Take 500 mg by mouth 2 (two) times daily as needed for mild pain.    Yes [provider]  amLODipine (NORVASC) 10 MG tablet Take 1 tablet (10 mg total) by mouth daily. 06/12/18  Yes Hosie Poisson, MD  cloNIDine (CATAPRES) 0.1 MG tablet Take 0.1 mg by mouth 2 (two) times daily.   Yes [provider]  ferrous sulfate 325 (65 FE) MG tablet Take 325 mg by mouth daily with breakfast.   Yes [provider]  furosemide (LASIX) 40 MG tablet Take 1 tablet (40 mg total) by mouth 2 (two) times daily. In am and with lunch as needed leg  swelling. Further refills Dr. Johnney Ou (kidney doctor) 03/07/19  Yes McLean-Scocuzza, Nino Glow, MD  gabapentin (NEURONTIN) 300 MG capsule Take 1 capsule (300 mg total) by mouth at bedtime. 04/29/19  Yes McLean-Scocuzza, Nino Glow, MD  hydrALAZINE (APRESOLINE) 50 MG tablet Take 1 tablet (50 mg total) by mouth every 8 (eight) hours. 06/11/18  Yes Hosie Poisson, MD  levothyroxine (SYNTHROID, LEVOTHROID) 100 MCG tablet Take 100 mcg by mouth daily before breakfast.   Yes [provider]  atorvastatin (LIPITOR) 40 MG tablet Take 1 tablet (40  mg total) by mouth daily at 6 PM. Patient not taking: Reported on 05/31/2019 09/03/18   McLean-Scocuzza, Nino Glow, MD  labetalol (NORMODYNE) 100 MG tablet Take 100 mg by mouth 2 (two) times daily.    [provider]    ALLERGY: Allergies  Allergen Reactions  . Clams [Shellfish Allergy] Swelling and Other (See Comments)    THROAT SWELLS NECK TURNS RED  . Hydralazine Other (See Comments)    CHEST TIGHTNESS Patient has tolerated multiple doses of hydralazine since allergy was listed   . Norvasc [Amlodipine Besylate] Swelling    Leg edema   . Milk-Related Compounds Diarrhea    Social History   Tobacco Use  . Smoking status: Never Smoker  . Smokeless tobacco: Never Used  Substance Use Topics  . Alcohol use: Not Currently    Frequency: Never    Comment: "nothing since age 89" (06/10/2018)     Family History  Problem Relation Age of Onset  . Diabetes Father   . Heart attack Father   . Hypertension Father   . Heart disease Father   . Kidney disease Father   . Diabetes Mother   . Hypertension Mother   . Heart disease Mother   . Kidney disease Mother   . Lupus Sister   . Heart disease Brother   . Diabetes Brother   . Heart disease Brother   . Diabetes Brother   . Lupus Sister      ROS   Review of Systems  Constitutional: Negative.   HENT: Negative.   Eyes: Negative.   Respiratory: Negative.   Cardiovascular: Negative.    Gastrointestinal: Negative for nausea and vomiting.  Musculoskeletal: Positive for back pain and myalgias.  Skin: Negative.   Neurological: Negative for dizziness, tingling, tremors, sensory change, speech change, focal weakness, seizures, loss of consciousness, weakness and headaches.     Exam   Vitals:   06/16/19 1100 06/16/19 1211  BP: (!) 168/70 (!) 179/55  Pulse: 77 76  Resp:  18  Temp:  (!) 100.6 F (38.1 C)  SpO2:  98%   elderly female, laying flat appears slightly confused from baseline No scleral injection non-labored breathing Motor exam: MAEW, nonfocal Lumbar wound: opening inferior aspect of wound. Minimal drainage on bandage (changed two hours ago). No active discharge. Exquisite tenderness to palpation along wound. No redness, + warmth, no streaking. Unable to express discharge with palpation  Results - Imaging/Labs   Results for orders placed or performed during the hospital encounter of 05/30/19 (from the past 48 hour(s))  Glucose, capillary     Status: Abnormal   Collection Time: 06/14/19  4:42 PM  Result Value Ref Range   Glucose-Capillary 114 (H) 70 - 99 mg/dL  Glucose, capillary     Status: None   Collection Time: 06/14/19  9:22 PM  Result Value Ref Range   Glucose-Capillary 90 70 - 99 mg/dL  Glucose, capillary     Status: Abnormal   Collection Time: 06/15/19  6:37 AM  Result Value Ref Range   Glucose-Capillary 103 (H) 70 - 99 mg/dL  SARS CORONAVIRUS 2 (TAT 6-24 HRS) Nasopharyngeal Nasopharyngeal Swab     Status: None   Collection Time: 06/15/19 11:20 AM   Specimen: Nasopharyngeal Swab  Result Value Ref Range   SARS Coronavirus 2 NEGATIVE NEGATIVE    Comment: (NOTE) SARS-CoV-2 target nucleic acids are NOT DETECTED. The SARS-CoV-2 RNA is generally detectable in upper and lower respiratory specimens during the acute phase of infection. Negative results  do not preclude SARS-CoV-2 infection, do not rule out co-infections with other pathogens, and  should not be used as the sole basis for treatment or other patient management decisions. Negative results must be combined with clinical observations, patient history, and epidemiological information. The expected result is Negative. Fact Sheet for Patients: SugarRoll.be Fact Sheet for Healthcare Providers: https://www.woods-mathews.com/ This test is not yet approved or cleared by the [place] FDA and  has been authorized for detection and/or diagnosis of SARS-CoV-2 by FDA under an Emergency Use Authorization (EUA). This EUA will remain  in effect (meaning this test can be used) for the duration of the COVID-19 declaration under Section 56 4(b)(1) of the Act, 21 U.S.C. section 360bbb-3(b)(1), unless the authorization is terminated or revoked sooner. Performed at [facility], [place] [address], [place], [place] [address]   Glucose, capillary     Status: Abnormal   Collection Time: 06/15/19 11:25 AM  Result Value Ref Range   Glucose-Capillary 120 (H) 70 - 99 mg/dL  Culture, blood (routine x 2)     Status: None (Preliminary result)   Collection Time: 06/15/19 12:45 PM   Specimen: BLOOD  Result Value Ref Range   Specimen Description BLOOD RIGHT ANTECUBITAL    Special Requests      BOTTLES DRAWN AEROBIC ONLY Blood Culture results may not be optimal due to an inadequate volume of blood received in culture bottles   Culture      NO GROWTH < 24 HOURS Performed at [facility] [address], [place], [place] [address]    Report Status PENDING   Culture, blood (routine x 2)     Status: None (Preliminary result)   Collection Time: 06/15/19 12:49 PM   Specimen: BLOOD  Result Value Ref Range   Specimen Description BLOOD RIGHT ANTECUBITAL    Special Requests      BOTTLES DRAWN AEROBIC ONLY Blood Culture results may not be optimal due to an inadequate volume of blood received in culture bottles   Culture      NO GROWTH < 24 HOURS  Performed at [facility] [address] [place], [place] [address]    Report Status PENDING   Glucose, capillary     Status: Abnormal   Collection Time: 06/15/19  4:29 PM  Result Value Ref Range   Glucose-Capillary 104 (H) 70 - 99 mg/dL  Urinalysis, Routine w reflex microscopic     Status: Abnormal   Collection Time: 06/15/19  6:00 PM  Result Value Ref Range   Color, Urine AMBER (A) YELLOW    Comment: BIOCHEMICALS MAY BE AFFECTED BY COLOR   APPearance CLOUDY (A) CLEAR   Specific Gravity, Urine 1.026 1.005 - 1.030   pH 5.0 5.0 - 8.0   Glucose, UA 50 (A) NEGATIVE mg/dL   Hgb urine dipstick NEGATIVE NEGATIVE   Bilirubin Urine NEGATIVE NEGATIVE   Ketones, ur NEGATIVE NEGATIVE mg/dL   Protein, ur >=300 (A) NEGATIVE mg/dL   Nitrite NEGATIVE NEGATIVE   Leukocytes,Ua MODERATE (A) NEGATIVE   RBC / HPF 0-5 0 - 5 RBC/hpf   WBC, UA >50 (H) 0 - 5 WBC/hpf   Bacteria, UA RARE (A) NONE SEEN   Squamous Epithelial / LPF 21-50 0 - 5   Budding Yeast PRESENT    Non Squamous Epithelial 0-5 (A) NONE SEEN    Comment: Performed at [facility], [place] [address] [place], [place] [address]  CBC     Status: Abnormal  Collection Time: 06/15/19  6:27 PM  Result Value Ref Range   WBC 17.4 (H) 4.0 - 10.5 K/uL   RBC 3.04 (L) 3.87 - 5.11 MIL/uL   Hemoglobin 7.9 (L) 12.0 - 15.0 g/dL   HCT 26.7 (L) 36.0 - 46.0 %   MCV 87.8 80.0 - 100.0 fL   MCH 26.0 26.0 - 34.0 pg   MCHC 29.6 (L) 30.0 - 36.0 g/dL   RDW 16.9 (H) 11.5 - 15.5 %   Platelets 234 150 - 400 K/uL   nRBC 0.3 (H) 0.0 - 0.2 %    Comment: Performed at [facility] [address] [place], [place] [address]  Lactic acid, plasma     Status: None   Collection Time: 06/15/19  6:27 PM  Result Value Ref Range   Lactic Acid, Venous 0.8 0.5 - 1.9 mmol/L    Comment: Performed at [facility] [address], [place], [place] [address]  Glucose, capillary     Status: None   Collection Time: 06/15/19 10:07 PM  Result Value Ref  Range   Glucose-Capillary 96 70 - 99 mg/dL  Glucose, capillary     Status: None   Collection Time: 06/16/19  6:23 AM  Result Value Ref Range   Glucose-Capillary 92 70 - 99 mg/dL  CBC     Status: Abnormal   Collection Time: 06/16/19  7:11 AM  Result Value Ref Range   WBC 16.2 (H) 4.0 - 10.5 K/uL   RBC 2.86 (L) 3.87 - 5.11 MIL/uL   Hemoglobin 7.3 (L) 12.0 - 15.0 g/dL   HCT 25.1 (L) 36.0 - 46.0 %   MCV 87.8 80.0 - 100.0 fL   MCH 25.5 (L) 26.0 - 34.0 pg   MCHC 29.1 (L) 30.0 - 36.0 g/dL   RDW 16.6 (H) 11.5 - 15.5 %   Platelets 239 150 - 400 K/uL   nRBC 0.2 0.0 - 0.2 %    Comment: Performed at [facility], [place] [address] [place], [place] [address]  Renal function panel     Status: Abnormal   Collection Time: 06/16/19  7:12 AM  Result Value Ref Range   Sodium 131 (L) 135 - 145 mmol/L   Potassium 3.8 3.5 - 5.1 mmol/L   Chloride 95 (L) 98 - 111 mmol/L   CO2 23 22 - 32 mmol/L   Glucose, Bld 95 70 - 99 mg/dL   BUN 40 (H) 8 - 23 mg/dL   Creatinine, Ser 5.82 (H) 0.44 - 1.00 mg/dL   Calcium 8.2 (L) 8.9 - 10.3 mg/dL   Phosphorus 4.2 2.5 - 4.6 mg/dL   Albumin 1.7 (L) 3.5 - 5.0 g/dL   GFR calc non Af Amer 7 (L) >60 mL/min   GFR calc Af Amer 8 (L) >60 mL/min   Anion gap 13 5 - 15    Comment: Performed at [facility] [address], [place], [place] [address]  Glucose, capillary     Status: Abnormal   Collection Time: 06/16/19 12:08 PM  Result Value Ref Range   Glucose-Capillary 107 (H) 70 - 99 mg/dL    No results found.  Impression/Plan   78 y.o. female with known chronic lumbar proteus wound infection and nonhealing wound stemming back >2 years. On exam, wound looks largely unchanged when compared over the last year. Unclear this would cause a systemic infection given chronicity.   Certainly, this wound can be washed out with subsequent wound vac placement, however,  the concern is in this chronic nonhealing wound of 2 years, will we end up in the same situation?  Dr  Kathyrn Sheriff will discuss with ID and plastics regarding possible plan for treatment including surgery and closure. Will make NPO at midnight in preparation for POSSIBLE wound wash out tomorrow.  Ferne Reus, PA-C [facility] [employer]

## 2019-06-16 NOTE — Consult Note (Signed)
[facility]       Reason for Consult: wound infection   Referring Physician: Dr. Wynelle Cleveland  Principal Problem:   Acute CHF (congestive heart failure) ([place]) Active Problems:   Hypothyroidism   DM (diabetes mellitus), type 2 with renal complications ([place])   Anemia   Acute renal failure superimposed on stage 3 chronic kidney disease ([place])   Essential hypertension   Pressure injury of skin   . amLODipine  10 mg Oral Daily  . atorvastatin  40 mg Oral q1800  . Chlorhexidine Gluconate Cloth  6 each Topical Q0600  . Chlorhexidine Gluconate Cloth  6 each Topical Q0600  . darbepoetin (ARANESP) injection - NON-DIALYSIS  150 mcg Subcutaneous Q Sat-1800  . gabapentin  200 mg Oral QHS  . heparin  5,000 Units Subcutaneous Q8H  . hydrALAZINE  100 mg Oral Q8H  . labetalol  200 mg Oral BID  . levothyroxine  100 mcg Oral QAC breakfast    Recommendations: Continue vancomycin and cefepime Neurosurgery evaluation imaging   Assessment: She has a history of recurrent lumbar infection with a previous lumbar fusion L3-5 and decompressive laminectomy done in 2014.  She now has drainage from the area concerning for a wound infection again.    Antibiotics: Vancomycin and cefepime  HPI: Taylor Tyler is a 78 y.o. female with a history of lumbar fusion, laminectomy done in 2014 with a revision in 2018 by Dr. Cyndy Freeze and lumbar wound infection which grew Proteus and took levaquin for about 8 weeks.  She returned in November of 2019 with concern for infection and again grew Proteus which was pansensitive.  Wound was superficial and she was put on oral amoxicilliln for 3 weeks.  She did not return until she saw may partner Dr. Prince Rome in May 2020, June and July with concern for infection again and recommended I and D. She did have an MRI last mont and noted a chronic fluid collection with a draining sinus tract.  She comes in now with symptoms of CHF but also has had persistent  luekocytosis and on exam today noted the draining tract in her back with concern for infection.  History from the patient and in review in Epic.   Review of Systems:  Constitutional: negative for fevers and chills Gastrointestinal: negative for nausea and diarrhea Integument/breast: negative for rash All other systems reviewed and are negative    Past Medical History:  Diagnosis Date  . Acute blood loss anemia 03/14/2017  . Acute renal failure superimposed on stage 3 chronic kidney disease ([place]) 03/14/2017  . AKI (acute kidney injury) ([place]) 02/28/2017  . Altered mental status 03/29/2017  . Anemia   . Aortic atherosclerosis ([place])   . Arthritis    "joints might ache at times; not that bad" (06/10/2018)  . Bilateral lower extremity edema 03/04/2017  . Bradycardia   . Chronic kidney disease    ?? renal insufficiency,   . CKD (chronic kidney disease) stage 3, GFR 30-59 ml/min 01/15/2017   ?? renal insufficiency, which she thinks is coming from "all these medications"  . Diabetes mellitus without complication ([place])    diagnosed 4-5 yrs ago, 06/21/18- "that was years ago"  . Disease of pancreas   . Diverticulitis    s/p perforation and partial colectomy 01/27/14 with 3 benign lymph nodes   . Diverticulosis 01/15/2017  . DJD (degenerative joint disease)   . DM (diabetes mellitus), type 2 with renal complications ([place]) 3/47/4259  . Fatty  liver   . Hypertension   . Hypothyroidism    "had radiation" (06/10/2018)  . Kidney stone   . Lethargy 02/28/2017  . Obesity, Class III, BMI 40-49.9 (morbid obesity) ([place]) 02/27/2017  . Pleural lipoma   . Postoperative wound infection 04/02/2017  . Spinal stenosis of lumbar region 01/15/2017  . Status post lumbar surgery   . Wound healing, delayed    back    Social History   Tobacco Use  . Smoking status: Never Smoker  . Smokeless tobacco: Never Used  Substance Use Topics  . Alcohol use: Not Currently    Frequency: Never    Comment: "nothing since age  64" (06/10/2018)  . Drug use: Never    Family History  Problem Relation Age of Onset  . Diabetes Father   . Heart attack Father   . Hypertension Father   . Heart disease Father   . Kidney disease Father   . Diabetes Mother   . Hypertension Mother   . Heart disease Mother   . Kidney disease Mother   . Lupus Sister   . Heart disease Brother   . Diabetes Brother   . Heart disease Brother   . Diabetes Brother   . Lupus Sister     Allergies  Allergen Reactions  . Clams [Shellfish Allergy] Swelling and Other (See Comments)    THROAT SWELLS NECK TURNS RED  . Hydralazine Other (See Comments)    CHEST TIGHTNESS Patient has tolerated multiple doses of hydralazine since allergy was listed   . Norvasc [Amlodipine Besylate] Swelling    Leg edema   . Milk-Related Compounds Diarrhea    Physical Exam: Constitutional: in no apparent distress  Vitals:   06/16/19 1100 06/16/19 1211  BP: (!) 168/70 (!) 179/55  Pulse: 77 76  Resp:  18  Temp:  (!) 100.6 F (38.1 C)  SpO2:  98%   EYES: anicteric ENMT: no thrush Cardiovascular: Cor RRR Respiratory: CTA B; normal respiratory effort GI: Bowel sounds are normal, liver is not enlarged, spleen is not enlarged Musculoskeletal: no edema Skin: negatives: no rash Neuro: non-focal Back: open area with drainage noted.   Lab Results  Component Value Date   WBC 16.2 (H) 06/16/2019   HGB 7.3 (L) 06/16/2019   HCT 25.1 (L) 06/16/2019   MCV 87.8 06/16/2019   PLT 239 06/16/2019    Lab Results  Component Value Date   CREATININE 5.82 (H) 06/16/2019   BUN 40 (H) 06/16/2019   NA 131 (L) 06/16/2019   K 3.8 06/16/2019   CL 95 (L) 06/16/2019   CO2 23 06/16/2019    Lab Results  Component Value Date   ALT 16 06/06/2019   AST 18 06/06/2019   ALKPHOS 45 06/06/2019     Microbiology: Recent Results (from the past 240 hour(s))  SARS CORONAVIRUS 2 (TAT 6-24 HRS) Nasopharyngeal Nasopharyngeal Swab     Status: None   Collection Time: 06/08/19   2:06 AM   Specimen: Nasopharyngeal Swab  Result Value Ref Range Status   SARS Coronavirus 2 NEGATIVE NEGATIVE Final    Comment: (NOTE) SARS-CoV-2 target nucleic acids are NOT DETECTED. The SARS-CoV-2 RNA is generally detectable in upper and lower respiratory specimens during the acute phase of infection. Negative results do not preclude SARS-CoV-2 infection, do not rule out co-infections with other pathogens, and should not be used as the sole basis for treatment or other patient management decisions. Negative results must be combined with clinical observations, patient history, and epidemiological information.  The expected result is Negative. Fact Sheet for Patients: SugarRoll.be Fact Sheet for Healthcare Providers: https://www.woods-mathews.com/ This test is not yet approved or cleared by the [place] FDA and  has been authorized for detection and/or diagnosis of SARS-CoV-2 by FDA under an Emergency Use Authorization (EUA). This EUA will remain  in effect (meaning this test can be used) for the duration of the COVID-19 declaration under Section 56 4(b)(1) of the Act, 21 U.S.C. section 360bbb-3(b)(1), unless the authorization is terminated or revoked sooner. Performed at [facility], [place] [address] [place], [place] [address]   Surgical PCR screen     Status: None   Collection Time: 06/08/19  2:10 AM   Specimen: Nasal Mucosa; Nasal Swab  Result Value Ref Range Status   MRSA, PCR NEGATIVE NEGATIVE Final   Staphylococcus aureus NEGATIVE NEGATIVE Final    Comment: (NOTE) The Xpert SA Assay (FDA approved for NASAL specimens in patients 49 years of age and older), is one component of a comprehensive surveillance program. It is not intended to diagnose infection nor to guide or monitor treatment. Performed at [facility], [place] [address] [place], [place] [address]   SARS CORONAVIRUS 2 (TAT 6-24 HRS) Nasopharyngeal  Nasopharyngeal Swab     Status: None   Collection Time: 06/15/19 11:20 AM   Specimen: Nasopharyngeal Swab  Result Value Ref Range Status   SARS Coronavirus 2 NEGATIVE NEGATIVE Final    Comment: (NOTE) SARS-CoV-2 target nucleic acids are NOT DETECTED. The SARS-CoV-2 RNA is generally detectable in upper and lower respiratory specimens during the acute phase of infection. Negative results do not preclude SARS-CoV-2 infection, do not rule out co-infections with other pathogens, and should not be used as the sole basis for treatment or other patient management decisions. Negative results must be combined with clinical observations, patient history, and epidemiological information. The expected result is Negative. Fact Sheet for Patients: SugarRoll.be Fact Sheet for Healthcare Providers: https://www.woods-mathews.com/ This test is not yet approved or cleared by the [place] FDA and  has been authorized for detection and/or diagnosis of SARS-CoV-2 by FDA under an Emergency Use Authorization (EUA). This EUA will remain  in effect (meaning this test can be used) for the duration of the COVID-19 declaration under Section 56 4(b)(1) of the Act, 21 U.S.C. section 360bbb-3(b)(1), unless the authorization is terminated or revoked sooner. Performed at [facility], [place] [address] [place], [place] [address]   Culture, blood (routine x 2)     Status: None (Preliminary result)   Collection Time: 06/15/19 12:45 PM   Specimen: BLOOD  Result Value Ref Range Status   Specimen Description BLOOD RIGHT ANTECUBITAL  Final   Special Requests   Final    BOTTLES DRAWN AEROBIC ONLY Blood Culture results may not be optimal due to an inadequate volume of blood received in culture bottles   Culture   Final    NO GROWTH < 24 HOURS Performed at [facility], [place] [address], [place], [place] [address]    Report Status PENDING  Incomplete  Culture, blood  (routine x 2)     Status: None (Preliminary result)   Collection Time: 06/15/19 12:49 PM   Specimen: BLOOD  Result Value Ref Range Status   Specimen Description BLOOD RIGHT ANTECUBITAL  Final   Special Requests   Final    BOTTLES DRAWN AEROBIC ONLY Blood Culture results may not be optimal due to an inadequate volume of blood received in culture  bottles   Culture   Final    NO GROWTH < 24 HOURS Performed at [facility], [place] [address], [place], [place] [address]    Report Status PENDING  Incomplete    Thayer Headings, [facility] [place] [email or web address] 06/16/2019, 3:35 PM

## 2019-06-16 NOTE — Progress Notes (Signed)
[facility] NEPHROLOG[facility] PROGRESS NOTE  Assessment/ Plan: Pt is a 78 y.o. yo female  with history of DM, HTN, CHF, anemia, HLD,CKD 4 admitted with CHF exacerbation, consulted for AKI on CKD.  #AKI on CKD stage IV, nonoliguric, now progressed to ESRD: Baseline creatinine seems to be around3. AKI likely due to CHF exacerbation.  Pt has been accepted to [facility] [place] [facility]. Pt currently awaiting SNF placement. Will get next HD treatment today to get her on o/p schedule. Has not had renal labs since 11/8, values were largely normal and stable prior to that. Anuric yesterday, oliguria < 242ml/day prior to that.  -Started dialysis on 06/08/2019. Placement of right IJ TDC and left brachiocephalic AV fistula creation by Dr. Donnetta Hutching on 11/4.  # Anemia due to CKD: stable ~8.  Iron saturation 17%. Continue IV iron. Continue ESA. Monitor CBC.  # Secondary hyperparathyroidism: PTH level99. Phosphorus level improved with HD.    # HTN/volume: continues to be elevated.  hydralazine to 100 3 times daily.  Continue amlodipine and labetalol.   Subjective:  Patient feels better today.  Still having some pain in her legs above the ankles.   Objective Physical Exam: General:NAD, comfortable. Laying in bed, receiving dialysis. Marland Kitchen   Heart:RRR, s1s2 nl. No murmurs.  Lungs:clear b/l, no crackle Extremities:trace pitting edema in LE b/l.   Legs directly proximal to ankles are TTP bilaterally.  Dialysis Access: L AV fistula, right tunneled HD cath  Vital signs in last 24 hours: Vitals:   06/15/19 2205 06/16/19 0030 06/16/19 0621 06/16/19 0700  BP: (!) 167/45 (!) 128/43 (!) 179/58 (!) 173/65  Pulse: 74 72 73 75  Resp: 20 20 18 18   Temp: 99.7 F (37.6 C) 99 F (37.2 C) 100.3 F (37.9 C) 99.7 F (37.6 C)  TempSrc: Oral Oral Oral Oral  SpO2: 97% 95% 95% 95%  Weight:   99.8 kg   Height:       Weight change: 12.1 kg  Intake/Output Summary (Last 24 hours) at  06/16/2019 0729 Last data filed at 06/16/2019 0600 Gross per 24 hour  Intake 1516.08 ml  Output 0 ml  Net 1516.08 ml       Labs: Basic Metabolic Panel: Recent Labs  Lab 06/10/19 0809 06/13/19 0458  NA 136 132*  K 3.8 3.5  CL 99 96*  CO2 24 23  GLUCOSE 96 95  BUN 31* 34*  CREATININE 3.66* 4.50*  CALCIUM 8.3* 8.4*  PHOS 3.8 3.8   Liver Function Tests: Recent Labs  Lab 06/10/19 0809 06/13/19 0458  ALBUMIN 1.8* 1.7*   No results for input(s): LIPASE, AMYLASE in the last 168 hours. No results for input(s): AMMONIA in the last 168 hours. CBC: Recent Labs  Lab 06/10/19 0809 06/13/19 0458 06/15/19 1827  WBC 20.2* 20.8* 17.4*  HGB 7.7* 7.7* 7.9*  HCT 26.4* 26.1* 26.7*  MCV 88.6 87.0 87.8  PLT 320 242 234   Cardiac Enzymes: No results for input(s): CKTOTAL, CKMB, CKMBINDEX, TROPONINI in the last 168 hours. CBG: Recent Labs  Lab 06/15/19 0637 06/15/19 1125 06/15/19 1629 06/15/19 2207 06/16/19 0623  GLUCAP 103* 120* 104* 96 92    Iron Studies: No results for input(s): IRON, TIBC, TRANSFERRIN, FERRITIN in the last 72 hours. Studies/Results: No results found.  Medications: Infusions: . sodium chloride 10 mL/hr at 06/15/19 1958  . sodium chloride    . sodium chloride    . ceFEPime (MAXIPIME) IV    . vancomycin  Scheduled Medications: . amLODipine  10 mg Oral Daily  . atorvastatin  40 mg Oral q1800  . Chlorhexidine Gluconate Cloth  6 each Topical Q0600  . Chlorhexidine Gluconate Cloth  6 each Topical Q0600  . darbepoetin (ARANESP) injection - NON-DIALYSIS  150 mcg Subcutaneous Q Sat-1800  . gabapentin  200 mg Oral QHS  . heparin  5,000 Units Subcutaneous Q8H  . hydrALAZINE  100 mg Oral Q8H  . labetalol  200 mg Oral BID  . levothyroxine  100 mcg Oral QAC breakfast    have reviewed scheduled and prn medications.   [name] 06/16/2019,7:29 AM  LOS: 17 days  Pager: [phone]

## 2019-06-16 NOTE — Consult Note (Signed)
   [facility] CM Inpatient Consult   06/16/2019  Taylor Tyler Jan 12, 1941 [id number]   Follow up:  [facility]  Chart reviewed for disposition and potential post hospital follow up needs with [facility] under [facility] Medicare.  Patient is currently awaiting insurance authorization for a skilled facility stay.  They are no current [facility] Care Management follow up needs at a SNF.  For questions, please contact:  Natividad Brood, RN BSN [facility] Liaison  [phone] business mobile phone Toll free office [phone]  Fax number: [phone] [name]

## 2019-06-16 NOTE — Progress Notes (Signed)
Patient with no urine output during shift.  Bladder scan shows 0. Patient not previously anuric but is CKD and new HD patient.  [facility] notified just as FYI.

## 2019-06-16 NOTE — Procedures (Signed)
I was present at this dialysis session. I have reviewed the session itself and made appropriate changes.   Tol Tx well. 3K bath, 3L UF goal.  TDC.    Started Vanc/Cefepime overnight for high fevers.  Cx pending.    Filed Weights   06/15/19 0513 06/16/19 0621 06/16/19 0700  Weight: 87.7 kg 99.8 kg 99.8 kg    Recent Labs  Lab 06/16/19 0712  NA 131*  K 3.8  CL 95*  CO2 23  GLUCOSE 95  BUN 40*  CREATININE 5.82*  CALCIUM 8.2*  PHOS 4.2    Recent Labs  Lab 06/13/19 0458 06/15/19 1827 06/16/19 0711  WBC 20.8* 17.4* 16.2*  HGB 7.7* 7.9* 7.3*  HCT 26.1* 26.7* 25.1*  MCV 87.0 87.8 87.8  PLT 242 234 239    Scheduled Meds: . amLODipine  10 mg Oral Daily  . atorvastatin  40 mg Oral q1800  . Chlorhexidine Gluconate Cloth  6 each Topical Q0600  . Chlorhexidine Gluconate Cloth  6 each Topical Q0600  . darbepoetin (ARANESP) injection - NON-DIALYSIS  150 mcg Subcutaneous Q Sat-1800  . gabapentin  200 mg Oral QHS  . heparin  5,000 Units Subcutaneous Q8H  . hydrALAZINE  100 mg Oral Q8H  . labetalol  200 mg Oral BID  . levothyroxine  100 mcg Oral QAC breakfast   Continuous Infusions: . sodium chloride 10 mL/hr at 06/15/19 1958  . sodium chloride    . sodium chloride    . ceFEPime (MAXIPIME) IV    . vancomycin     PRN Meds:.sodium chloride, sodium chloride, acetaminophen **OR** acetaminophen, alteplase, heparin, heparin, heparin, hydrALAZINE, HYDROcodone-acetaminophen, ipratropium, levalbuterol, lidocaine (PF), lidocaine-prilocaine, ondansetron **OR** ondansetron (ZOFRAN) IV, pentafluoroprop-tetrafluoroeth   Pearson Grippe  MD 06/16/2019, 9:33 AM

## 2019-06-16 NOTE — Progress Notes (Signed)
Renal Navigator spoke with CSW/A. Hill who confirms that patient will be ready to discharge to SNF as soon as she receives insurance authorization. Renal Navigator spoke with patient's son/[name] Ammirati to request that he report to the clinic tomorrow to sign patient's consents prior to her projected start on Saturday, 06/18/19. Renal Navigator will continue to follow.  Alphonzo Cruise, [place] Renal Navigator [phone]

## 2019-06-16 NOTE — Progress Notes (Addendum)
PROGRESS NOTE    Taylor Tyler   HBZ:[id number]  DOB: 03-31-1941  DOA: 05/30/2019 PCP: McLean-Scocuzza, Nino Glow, MD   Brief Narrative:  Taylor Tyler is a 78 yo with diabetes mellitus type 2 presently not on medication, chronic diastolic hypertension, chronic kidney disease stage III, anemia, hypothyroidism, hyperlipidemia.   She presented to the ED on 10/26 for dyspnea after a fall out of bed the night before and being on the floor for about 8 hrs. Her son found her in the AM. She was short of breath for at least 2 days.   In ED, noted to be wheezing. Suspected to have acute diastolic CHF and started on Lasix.  Also noted to have AKI with Cr of 5.74 and K of 5.3.  Past Cr 2.64.   Subjective: No complaints today.     Assessment & Plan:   Principal Problem:  Acute diastolic CHF (congestive heart failure)   Acute on chronic kidney disease stage 4 >> ESRD Metabolic acidosis - nephrology consulted - she did not diurese well - 11/4 - tunneled dialysis cath placed and underwent dialysis - has been determined to need permanent dialysis -an outpt dialysis spot has been found- her schedule will be Tu/Th/Sat  Active Problems: Leukocytosis, fever > Sepsis from infected back wound - temp spike 103 on 11/11 - dialysis cath appears clean and non-tender - no respiratory symptoms - COVID test done for admission to SNF was negative - UA shows rare bacteria and > 50 wbc with yeast - Urine culture and blood cultures ordered. - started Vanc and Cefepime as she is a dialysis patient and high risk for bacteremia - fever resolved- WBC still elevated- f/u cultures  - RN mentioned that she has a stage 2 ulcer on her back. I; have looked at it today to see if it is the source of her fever. This is actually a wound from a prior back surgery which has appears to be draining. Currently draining a large amount of foul smelling discharge. The patient has no pain in her back- will culture this. I have  consulted NS and spoken with Dr Ellene Route for his team to consider an I and D. Per prior notes this wound has been infected in the past and the last I and D was in 11/19 by Dr Kathyrn Sheriff.   Hypothyroidism  - cont Synthroid  DM? - last A1c was 5.2- not on medication at home - sugars have been normal - stopped fingersticks  AOCD - appreciate management per renal    Pressure ulcer- stage 2 left buttock - cont wound care   HTN - on Norvasc, Labetalol, Hydralazine  HLD - Lipitor    Time spent in minutes: 35 min   DVT prophylaxis:Heparin Code Status: Full code Family Communication:   Disposition Plan: SNF Consultants:   Nephrology  Vascular surgery Procedures:    11/4 tunneled cath Antimicrobials:  Anti-infectives (From admission, onward)   Start     Dose/Rate Route Frequency Ordered Stop   06/16/19 1800  ceFEPIme (MAXIPIME) 2 g in sodium chloride 0.9 % 100 mL IVPB     2 g 200 mL/hr over 30 Minutes Intravenous Once per day on Tue Thu Sat 06/15/19 1904     06/16/19 1200  vancomycin (VANCOCIN) IVPB 1000 mg/200 mL premix     1,000 mg 200 mL/hr over 60 Minutes Intravenous Every T-Th-Sa (Hemodialysis) 06/15/19 1904     06/15/19 2030  vancomycin (VANCOCIN) 2,000 mg in sodium chloride 0.9 %  500 mL IVPB     2,000 mg 250 mL/hr over 120 Minutes Intravenous  Once 06/15/19 1902 06/16/19 0010   06/15/19 2000  ceFEPIme (MAXIPIME) 2 g in sodium chloride 0.9 % 100 mL IVPB     2 g 200 mL/hr over 30 Minutes Intravenous  Once 06/15/19 1902 06/15/19 2028   06/08/19 0600  cefUROXime (ZINACEF) 1.5 g in sodium chloride 0.9 % 100 mL IVPB     1.5 g 200 mL/hr over 30 Minutes Intravenous On call to O.R. 06/07/19 1519 06/08/19 1328       Objective: Vitals:   06/16/19 0930 06/16/19 1000 06/16/19 1030 06/16/19 1100  BP: (!) 172/66 (!) 158/72 (!) 173/59 (!) 168/70  Pulse: 79 76 75 77  Resp:      Temp:      TempSrc:      SpO2:      Weight:      Height:        Intake/Output Summary (Last  24 hours) at 06/16/2019 1155 Last data filed at 06/16/2019 0600 Gross per 24 hour  Intake 1276.08 ml  Output 0 ml  Net 1276.08 ml   Filed Weights   06/15/19 0513 06/16/19 0621 06/16/19 0700  Weight: 87.7 kg 99.8 kg 99.8 kg    Examination: General exam: Appears comfortable  HEENT: PERRLA, oral mucosa moist, no sclera icterus or thrush Respiratory system: Clear to auscultation. Respiratory effort normal. Cardiovascular system: S1 & S2 heard,  No murmurs  Gastrointestinal system: Abdomen soft, non-tender, nondistended. Normal bowel sounds   Central nervous system: Alert and oriented. No focal neurological deficits. Extremities: No cyanosis, clubbing or edema Skin:  Wound on mid back    Psychiatry:  Mood & affect appropriate.      Data Reviewed: I have personally reviewed following labs and imaging studies  CBC: Recent Labs  Lab 06/10/19 0809 06/13/19 0458 06/15/19 1827 06/16/19 0711  WBC 20.2* 20.8* 17.4* 16.2*  HGB 7.7* 7.7* 7.9* 7.3*  HCT 26.4* 26.1* 26.7* 25.1*  MCV 88.6 87.0 87.8 87.8  PLT 320 242 234 875   Basic Metabolic Panel: Recent Labs  Lab 06/10/19 0809 06/13/19 0458 06/16/19 0712  NA 136 132* 131*  K 3.8 3.5 3.8  CL 99 96* 95*  CO2 24 23 23   GLUCOSE 96 95 95  BUN 31* 34* 40*  CREATININE 3.66* 4.50* 5.82*  CALCIUM 8.3* 8.4* 8.2*  PHOS 3.8 3.8 4.2   GFR: Estimated Creatinine Clearance: 9.3 mL/min (A) (by C-G formula based on SCr of 5.82 mg/dL (H)). Liver Function Tests: Recent Labs  Lab 06/10/19 0809 06/13/19 0458 06/16/19 0712  ALBUMIN 1.8* 1.7* 1.7*   No results for input(s): LIPASE, AMYLASE in the last 168 hours. No results for input(s): AMMONIA in the last 168 hours. Coagulation Profile: No results for input(s): INR, PROTIME in the last 168 hours. Cardiac Enzymes: No results for input(s): CKTOTAL, CKMB, CKMBINDEX, TROPONINI in the last 168 hours. BNP (last 3 results) No results for input(s): PROBNP in the last 8760 hours. HbA1C:  No results for input(s): HGBA1C in the last 72 hours. CBG: Recent Labs  Lab 06/15/19 0637 06/15/19 1125 06/15/19 1629 06/15/19 2207 06/16/19 0623  GLUCAP 103* 120* 104* 96 92   Lipid Profile: No results for input(s): CHOL, HDL, LDLCALC, TRIG, CHOLHDL, LDLDIRECT in the last 72 hours. Thyroid Function Tests: No results for input(s): TSH, T4TOTAL, FREET4, T3FREE, THYROIDAB in the last 72 hours. Anemia Panel: No results for input(s): VITAMINB12, FOLATE, FERRITIN, TIBC, IRON, RETICCTPCT  in the last 72 hours. Urine analysis:    Component Value Date/Time   COLORURINE AMBER (A) 06/15/2019 1800   APPEARANCEUR CLOUDY (A) 06/15/2019 1800   APPEARANCEUR Cloudy 12/14/2013 1727   LABSPEC 1.026 06/15/2019 1800   LABSPEC 1.012 12/14/2013 1727   PHURINE 5.0 06/15/2019 1800   GLUCOSEU 50 (A) 06/15/2019 1800   GLUCOSEU NEGATIVE 07/21/2017 1001   HGBUR NEGATIVE 06/15/2019 1800   BILIRUBINUR NEGATIVE 06/15/2019 1800   BILIRUBINUR Negative 12/14/2013 1727   KETONESUR NEGATIVE 06/15/2019 1800   PROTEINUR >=300 (A) 06/15/2019 1800   UROBILINOGEN 0.2 07/21/2017 1001   NITRITE NEGATIVE 06/15/2019 1800   LEUKOCYTESUR MODERATE (A) 06/15/2019 1800   LEUKOCYTESUR 3+ 12/14/2013 1727   Sepsis Labs: @LABRCNTIP (procalcitonin:4,lacticidven:4) ) Recent Results (from the past 240 hour(s))  SARS CORONAVIRUS 2 (TAT 6-24 HRS) Nasopharyngeal Nasopharyngeal Swab     Status: None   Collection Time: 06/08/19  2:06 AM   Specimen: Nasopharyngeal Swab  Result Value Ref Range Status   SARS Coronavirus 2 NEGATIVE NEGATIVE Final    Comment: (NOTE) SARS-CoV-2 target nucleic acids are NOT DETECTED. The SARS-CoV-2 RNA is generally detectable in upper and lower respiratory specimens during the acute phase of infection. Negative results do not preclude SARS-CoV-2 infection, do not rule out co-infections with other pathogens, and should not be used as the sole basis for treatment or other patient management decisions.  Negative results must be combined with clinical observations, patient history, and epidemiological information. The expected result is Negative. Fact Sheet for Patients: SugarRoll.be Fact Sheet for Healthcare Providers: https://www.woods-mathews.com/ This test is not yet approved or cleared by the [place] FDA and  has been authorized for detection and/or diagnosis of SARS-CoV-2 by FDA under an Emergency Use Authorization (EUA). This EUA will remain  in effect (meaning this test can be used) for the duration of the COVID-19 declaration under Section 56 4(b)(1) of the Act, 21 U.S.C. section 360bbb-3(b)(1), unless the authorization is terminated or revoked sooner. Performed at [facility], [place] [address] [place], [place] [address]   Surgical PCR screen     Status: None   Collection Time: 06/08/19  2:10 AM   Specimen: Nasal Mucosa; Nasal Swab  Result Value Ref Range Status   MRSA, PCR NEGATIVE NEGATIVE Final   Staphylococcus aureus NEGATIVE NEGATIVE Final    Comment: (NOTE) The Xpert SA Assay (FDA approved for NASAL specimens in patients 11 years of age and older), is one component of a comprehensive surveillance program. It is not intended to diagnose infection nor to guide or monitor treatment. Performed at [facility], [place] [address] [place], [place] [address]   SARS CORONAVIRUS 2 (TAT 6-24 HRS) Nasopharyngeal Nasopharyngeal Swab     Status: None   Collection Time: 06/15/19 11:20 AM   Specimen: Nasopharyngeal Swab  Result Value Ref Range Status   SARS Coronavirus 2 NEGATIVE NEGATIVE Final    Comment: (NOTE) SARS-CoV-2 target nucleic acids are NOT DETECTED. The SARS-CoV-2 RNA is generally detectable in upper and lower respiratory specimens during the acute phase of infection. Negative results do not preclude SARS-CoV-2 infection, do not rule out co-infections with other pathogens, and should not be used as the  sole basis for treatment or other patient management decisions. Negative results must be combined with clinical observations, patient history, and epidemiological information. The expected result is Negative. Fact Sheet for Patients: SugarRoll.be Fact Sheet for Healthcare Providers: https://www.woods-mathews.com/ This test is not yet approved or cleared by the [place] FDA  and  has been authorized for detection and/or diagnosis of SARS-CoV-2 by FDA under an Emergency Use Authorization (EUA). This EUA will remain  in effect (meaning this test can be used) for the duration of the COVID-19 declaration under Section 56 4(b)(1) of the Act, 21 U.S.C. section 360bbb-3(b)(1), unless the authorization is terminated or revoked sooner. Performed at [facility], [place] [address], [place], [place] [address]   Culture, blood (routine x 2)     Status: None (Preliminary result)   Collection Time: 06/15/19 12:45 PM   Specimen: BLOOD  Result Value Ref Range Status   Specimen Description BLOOD RIGHT ANTECUBITAL  Final   Special Requests   Final    BOTTLES DRAWN AEROBIC ONLY Blood Culture results may not be optimal due to an inadequate volume of blood received in culture bottles   Culture   Final    NO GROWTH < 24 HOURS Performed at [facility], [place] [address] [place], [place] [address]    Report Status PENDING  Incomplete  Culture, blood (routine x 2)     Status: None (Preliminary result)   Collection Time: 06/15/19 12:49 PM   Specimen: BLOOD  Result Value Ref Range Status   Specimen Description BLOOD RIGHT ANTECUBITAL  Final   Special Requests   Final    BOTTLES DRAWN AEROBIC ONLY Blood Culture results may not be optimal due to an inadequate volume of blood received in culture bottles   Culture   Final    NO GROWTH < 24 HOURS Performed at [facility], [place] [address], [place], [place] [address]    Report Status PENDING   Incomplete         Radiology Studies: No results found.    Scheduled Meds: . amLODipine  10 mg Oral Daily  . atorvastatin  40 mg Oral q1800  . Chlorhexidine Gluconate Cloth  6 each Topical Q0600  . Chlorhexidine Gluconate Cloth  6 each Topical Q0600  . darbepoetin (ARANESP) injection - NON-DIALYSIS  150 mcg Subcutaneous Q Sat-1800  . gabapentin  200 mg Oral QHS  . heparin  5,000 Units Subcutaneous Q8H  . hydrALAZINE  100 mg Oral Q8H  . labetalol  200 mg Oral BID  . levothyroxine  100 mcg Oral QAC breakfast   Continuous Infusions: . sodium chloride 10 mL/hr at 06/15/19 1958  . sodium chloride    . sodium chloride    . ceFEPime (MAXIPIME) IV    . vancomycin Stopped (06/16/19 1126)     LOS: 17 days      Debbe Odea, MD [facility] Pager: [email or web address] Password [facility] 06/16/2019, 11:55 AM

## 2019-06-16 NOTE — Plan of Care (Signed)
  Problem: Clinical Measurements: Goal: Ability to maintain clinical measurements within normal limits will improve Outcome: Progressing   Problem: Clinical Measurements: Goal: Respiratory complications will improve Outcome: Progressing   Problem: Clinical Measurements: Goal: Cardiovascular complication will be avoided Outcome: Progressing   

## 2019-06-17 ENCOUNTER — Inpatient Hospital Stay (HOSPITAL_COMMUNITY): Payer: Medicare Other

## 2019-06-17 DIAGNOSIS — R509 Fever, unspecified: Secondary | ICD-10-CM

## 2019-06-17 DIAGNOSIS — Z8619 Personal history of other infectious and parasitic diseases: Secondary | ICD-10-CM

## 2019-06-17 DIAGNOSIS — Z992 Dependence on renal dialysis: Secondary | ICD-10-CM

## 2019-06-17 DIAGNOSIS — L089 Local infection of the skin and subcutaneous tissue, unspecified: Secondary | ICD-10-CM

## 2019-06-17 DIAGNOSIS — N186 End stage renal disease: Secondary | ICD-10-CM

## 2019-06-17 LAB — URINE CULTURE: Culture: >=100000 — AB

## 2019-06-17 LAB — CBC
HCT: 27.2 % — ABNORMAL LOW (ref 36.0–46.0)
Hemoglobin: 8 g/dL — ABNORMAL LOW (ref 12.0–15.0)
MCH: 26.1 pg (ref 26.0–34.0)
MCHC: 29.4 g/dL — ABNORMAL LOW (ref 30.0–36.0)
MCV: 88.9 fL (ref 80.0–100.0)
Platelets: 239 10*3/uL (ref 150–400)
RBC: 3.06 MIL/uL — ABNORMAL LOW (ref 3.87–5.11)
RDW: 16.9 % — ABNORMAL HIGH (ref 11.5–15.5)
WBC: 16 10*3/uL — ABNORMAL HIGH (ref 4.0–10.5)
nRBC: 0.3 % — ABNORMAL HIGH (ref 0.0–0.2)

## 2019-06-17 LAB — GLUCOSE, CAPILLARY
Glucose-Capillary: 111 mg/dL — ABNORMAL HIGH (ref 70–99)
Glucose-Capillary: 107 mg/dL — ABNORMAL HIGH (ref 70–99)
Glucose-Capillary: 88 mg/dL (ref 70–99)
Glucose-Capillary: 73 mg/dL (ref 70–99)

## 2019-06-17 MED ORDER — DARBEPOETIN ALFA 150 MCG/0.3ML IJ SOSY
150.0000 ug | PREFILLED_SYRINGE | INTRAMUSCULAR | Status: DC
  Administered 2019-06-18 – 2019-07-02 (×3): 150 ug via INTRAVENOUS
  Filled 2019-06-17 (×3): qty 0.3

## 2019-06-17 NOTE — Progress Notes (Signed)
[facility] NEPHROLOGY PROGRESS NOTE  Assessment/ Plan: Pt is a 78 y.o. yo female  with history of DM, HTN, CHF, anemia, HLD,CKD 4 admitted with CHF exacerbation, consulted for AKI on CKD.  #AKI on CKD stage IV, nonoliguric, now progressed to ESRD: Baseline creatinine seems to be around3. AKI likely due to CHF exacerbation.  Pt has been accepted to [facility] in [place] [facility]. Pt currently awaiting SNF placement.  Currently anuric.  -Started dialysis on 06/08/2019. Placement of right IJ TDC and left brachiocephalic AV fistula creation by Dr. Donnetta Hutching on 11/4. - now on TTS schedule, consistent with outpatient schedule  #fever, leukocytosis - has had elevated wbc count over several days, had a Tmax of 103.1 yesterday. Pt states she feels well though. Blood and urine cultures pending, per primary. ID and neurosurgery consulted, pt currently NPO for possible wound wash out of a previous lumbar surgery wound. On vanc and cefepime.  Managed per primary team.   # Anemia due to CKD: stable ~8.  Iron saturation 17%. Continue IV iron. Continue ESA. Monitor CBC.  # Secondary hyperparathyroidism: PTH level99. Phosphorus level improved with HD.    # HTN/volume: continues to be elevated.  hydralazine to 100 3 times daily.  Continue amlodipine and labetalol.   Subjective:  Pt states she feels slightly better today.  Doesn't remember getting dialysis yesterday, but seems to always have trouble remembering when she gets dialysis.  Does not feel feverish, no cough, no chills.    Objective Physical Exam: General:NAD, sitting on edge of bed getting ready to start PT   Heart:RRR, s1s2 nl. No murmurs.  Lungs:clear b/l, no crackle Extremities:no pitting edema in LE b/l.   Legs directly proximal to ankles are TTP bilaterally.  Dialysis Access: L AV fistula, right tunneled HD cath. No sign of infection  Vital signs in last 24 hours: Vitals:   06/16/19 1629 06/16/19 1943  06/16/19 2229 06/17/19 0523  BP: (!) 149/36 (!) 171/54 107/67 (!) 159/37  Pulse: 76 77  74  Resp: 20 20  (!) 22  Temp: 99.4 F (37.4 C) (!) 101 F (38.3 C)  100.1 F (37.8 C)  TempSrc: Oral Oral  Oral  SpO2: 91% 92%  95%  Weight:    98 kg  Height:       Weight change: -0.091 kg  Intake/Output Summary (Last 24 hours) at 06/17/2019 0749 Last data filed at 06/16/2019 2230 Gross per 24 hour  Intake 2647 ml  Output 3000 ml  Net -353 ml       Labs: Basic Metabolic Panel: Recent Labs  Lab 06/10/19 0809 06/13/19 0458 06/16/19 0712  NA 136 132* 131*  K 3.8 3.5 3.8  CL 99 96* 95*  CO2 24 23 23   GLUCOSE 96 95 95  BUN 31* 34* 40*  CREATININE 3.66* 4.50* 5.82*  CALCIUM 8.3* 8.4* 8.2*  PHOS 3.8 3.8 4.2   Liver Function Tests: Recent Labs  Lab 06/10/19 0809 06/13/19 0458 06/16/19 0712  ALBUMIN 1.8* 1.7* 1.7*   No results for input(s): LIPASE, AMYLASE in the last 168 hours. No results for input(s): AMMONIA in the last 168 hours. CBC: Recent Labs  Lab 06/10/19 0809 06/13/19 0458 06/15/19 1827 06/16/19 0711 06/17/19 0502  WBC 20.2* 20.8* 17.4* 16.2* 16.0*  HGB 7.7* 7.7* 7.9* 7.3* 8.0*  HCT 26.4* 26.1* 26.7* 25.1* 27.2*  MCV 88.6 87.0 87.8 87.8 88.9  PLT 320 242 234 239 239   Cardiac Enzymes: No results for input(s): CKTOTAL,  CKMB, CKMBINDEX, TROPONINI in the last 168 hours. CBG: Recent Labs  Lab 06/16/19 0623 06/16/19 1208 06/16/19 1623 06/16/19 2229 06/17/19 0545  GLUCAP 92 107* 90 105* 111*    Iron Studies: No results for input(s): IRON, TIBC, TRANSFERRIN, FERRITIN in the last 72 hours. Studies/Results: No results found.  Medications: Infusions: . sodium chloride 10 mL/hr at 06/15/19 1958  . ceFEPime (MAXIPIME) IV 2 g (06/16/19 1743)  . vancomycin Stopped (06/16/19 1126)    Scheduled Medications: . amLODipine  10 mg Oral Daily  . atorvastatin  40 mg Oral q1800  . Chlorhexidine Gluconate Cloth  6 each Topical Q0600  . Chlorhexidine  Gluconate Cloth  6 each Topical Q0600  . darbepoetin (ARANESP) injection - NON-DIALYSIS  150 mcg Subcutaneous Q Sat-1800  . gabapentin  200 mg Oral QHS  . heparin  5,000 Units Subcutaneous Q8H  . hydrALAZINE  100 mg Oral Q8H  . labetalol  200 mg Oral BID  . levothyroxine  100 mcg Oral QAC breakfast    have reviewed scheduled and prn medications.   [name] 06/17/2019,7:49 AM  LOS: 18 days  Pager: [phone]

## 2019-06-17 NOTE — Progress Notes (Addendum)
Physical Therapy Treatment Patient Details Name: Taylor Tyler MRN: [id number] DOB: 08-30-1940 Today's Date: 06/17/2019    History of Present Illness 78 y.o. female admitted 05/30/19 after fall at home, laying on the floor at least 8 hours until found by son, also with worsening SOB and LE edema. VQ scan with low probability for PE. Worked up for HF. Other PMH includes CKD III, DM2, DJD s/p lumbar sx (2018), arthritis.    PT Comments    Patient progressing slowly towards PT goals Continues to be confused and demonstrates cognitive deficits relating to orientation, attention, problem solving, awareness and safety. Increased time and cues to perform all tasks with repetition. Requires Mod A for bed mobility and standing transfers. Noted to have bil knee instability and tremoring/shaking noted in trunk/BUEs. Reports this is new since hospitalization? Fatigues quickly and declined further walking due to weakness. VSS. Will follow.    Follow Up Recommendations  SNF     Equipment Recommendations  None recommended by PT    Recommendations for Other Services       Precautions / Restrictions Precautions Precautions: Fall Restrictions Weight Bearing Restrictions: No    Mobility  Bed Mobility Overal bed mobility: Needs Assistance Bed Mobility: Rolling;Sidelying to Sit Rolling: Min assist Sidelying to sit: Min assist;HOB elevated       General bed mobility comments: Increased time and cues to roll onto left side as pt hugging rail on right side; step by step cues, assist with trunk to get upright.  Transfers Overall transfer level: Needs assistance Equipment used: Rolling walker (2 wheeled) Transfers: Sit to/from Stand Sit to Stand: Mod assist         General transfer comment: Mod A to power to standing with cues for hand placement/technique and use of momentum. Stood from elevated bed height x1, from chair x1.  Ambulation/Gait Ambulation/Gait assistance: Min assist Gait  Distance (Feet): 4 Feet Assistive device: Rolling walker (2 wheeled) Gait Pattern/deviations: Trunk flexed;Step-to pattern;Shuffle Gait velocity: Decreased   General Gait Details: Able to take a few steps to get to chair but declined further ambulation due to weakness, noted to have trembling BUEs/trunk and bil knee instability.   Stairs             Wheelchair Mobility    Modified Rankin (Stroke Patients Only)       Balance Overall balance assessment: Needs assistance Sitting-balance support: Feet supported;No upper extremity supported Sitting balance-Leahy Scale: Fair Sitting balance - Comments: Tremoring noted to BUEs.   Standing balance support: During functional activity Standing balance-Leahy Scale: Poor Standing balance comment: Requires UE support in standing and phyiscal A today.                            Cognition Arousal/Alertness: Awake/alert Behavior During Therapy: WFL for tasks assessed/performed Overall Cognitive Status: Impaired/Different from baseline Area of Impairment: Memory;Problem solving;Attention                 Orientation Level: Disoriented to;Situation;Time Current Attention Level: Sustained Memory: Decreased short-term memory Following Commands: Follows one step commands inconsistently;Follows one step commands with increased time Safety/Judgement: Decreased awareness of safety;Decreased awareness of deficits Awareness: Intellectual Problem Solving: Slow processing;Requires verbal cues General Comments: Seems confused today. Requires frequent repetition within session.      Exercises Other Exercises Other Exercises: Sit to stand from chair x2 for strengthening    General Comments General comments (skin integrity, edema, etc.): Not aware she had  dialysis yesterday.      Pertinent Vitals/Pain Pain Assessment: Faces Faces Pain Scale: No hurt    Home Living                      Prior Function             PT Goals (current goals can now be found in the care plan section) Progress towards PT goals: Progressing toward goals(slowly)    Frequency    Min 2X/week      PT Plan Current plan remains appropriate;Frequency needs to be updated    Co-evaluation              AM-PAC PT "6 Clicks" Mobility   Outcome Measure  Help needed turning from your back to your side while in a flat bed without using bedrails?: A Little Help needed moving from lying on your back to sitting on the side of a flat bed without using bedrails?: A Little Help needed moving to and from a bed to a chair (including a wheelchair)?: A Lot Help needed standing up from a chair using your arms (e.g., wheelchair or bedside chair)?: A Lot Help needed to walk in hospital room?: A Little Help needed climbing 3-5 steps with a railing? : Total 6 Click Score: 14    End of Session Equipment Utilized During Treatment: Gait belt Activity Tolerance: Patient limited by fatigue Patient left: in chair;with call bell/phone within reach;with chair alarm set Nurse Communication: Mobility status PT Visit Diagnosis: Other abnormalities of gait and mobility (R26.89)     Time: 0174-9449 PT Time Calculation (min) (ACUTE ONLY): 19 min  Charges:  $Therapeutic Activity: 8-22 mins                     Marisa Severin, PT, DPT Acute Rehabilitation Services Pager [phone] Office [phone]       Taylor Tyler 06/17/2019, 8:42 AM

## 2019-06-17 NOTE — Progress Notes (Signed)
Pt known to me with previous lumbar fusion surgery by previous partner in 2018, has had chronic non-healing lumbar wound and has undergone 4 subsequent wound washouts including 2 previous woundvac placements. Now admitted with fever, leukocytosis of unclear etiology. Her wound looks similar today as it has over the last 12 months, review of her labs suggest possible fungal UTI. I have spoken to Dr. Linus Salmons who agrees that further wound washouts are unlikely to improve likelihood of wound healing as this has not been the case over the last several surgeries. However, there is the possibility that her lumbar hardware is infected and is the reason for her chronic non-healing lumbar wound. We will therefore plan on CT lumbar spine to assess for fusion. If she is in fact fused, could then consider another wound washout with hardware removal.

## 2019-06-17 NOTE — Progress Notes (Signed)
Renal Navigator spoke with Clinic Manager/B. Morris at [facility] who states patient's son did not come to sign patient's consents for treatment as arranged yesterday.  Renal Navigator spoke with Attending/Dr. Wynelle Cleveland for medical update. She states patient is having CT scan today to determine course of treatment and that there is no chance of discharge today. Navigator informed MD that since patient's son has not signed consents, patient would not be able to start in the clinic tomorrow anyway. Renal Navigator spoke with patient's son/[name] who states he thought he could wait based on his conversation with CSW because we were still awaiting insurance authorization this morning. Renal Navigator informed him that these consents need to be completed before his mother will be able to be treated in the OP HD clinic and that the consents are good for 30 days, requesting that he go at his next opportunity. He states he will go Monday. Renal Navigator to continue to follow closely.  Alphonzo Cruise, [place] Renal Navigator [phone]

## 2019-06-17 NOTE — Progress Notes (Signed)
PROGRESS NOTE    Taylor Tyler   RUE:[id number]  DOB: 1940/10/10  DOA: 05/30/2019 PCP: McLean-Scocuzza, Nino Glow, MD   Brief Narrative:  Taylor Tyler is a 78 yo with diabetes mellitus type 2 presently not on medication, chronic diastolic hypertension, chronic kidney disease stage III, anemia, hypothyroidism, hyperlipidemia.   She presented to the ED on 10/26 for dyspnea after a fall out of bed the night before and being on the floor for about 8 hrs. Her son found her in the AM. She was short of breath for at least 2 days.   In ED, noted to be wheezing. Suspected to have acute diastolic CHF and started on Lasix.  Also noted to have AKI with Cr of 5.74 and K of 5.3.  Past Cr 2.64.   Subjective: No complaints. Appears confused to situation today.     Assessment & Plan:   Principal Problem:  Acute diastolic CHF (congestive heart failure)   Acute on chronic kidney disease stage 4 >> ESRD Metabolic acidosis - nephrology consulted - she did not diurese well - 11/4 - tunneled dialysis cath placed and underwent dialysis - has been determined to need permanent dialysis -an outpt dialysis spot has been found- her schedule will be Tu/Th/Sat  Active Problems: Leukocytosis, fever > Sepsis from infected back wound - temp spike 103 on 11/11 - started Vanc and Cefepime    - draining wound on lumbar spine related to lumbar fusion surgery in 2018- NS consulted on 11/12- plan for CT L spine to check hardware in spine - ID consulted 11/12 - COVID test done for admission to SNF on 11/11 was negative - UA shows rare bacteria and > 50 wbc with yeast - Urine culture shows > 100 K yeast  Possible acute encephalopathy - may be due to sepsis- follow   Hypothyroidism  - cont Synthroid  DM? - last A1c was 5.2- not on medication at home - sugars have been normal - stopped fingersticks  AOCD - appreciate management per renal    Pressure ulcer- stage 2 left buttock - cont wound care   HTN -  on Norvasc, Labetalol, Hydralazine  HLD - Lipitor  Anemia of chronic disease - following   Time spent in minutes: 35 min   DVT prophylaxis:Heparin Code Status: Full code Family Communication:   Disposition Plan: SNF Consultants:   Nephrology  Vascular surgery Procedures:    11/4 tunneled cath Antimicrobials:  Anti-infectives (From admission, onward)   Start     Dose/Rate Route Frequency Ordered Stop   06/16/19 1800  ceFEPIme (MAXIPIME) 2 g in sodium chloride 0.9 % 100 mL IVPB     2 g 200 mL/hr over 30 Minutes Intravenous Once per day on Tue Thu Sat 06/15/19 1904     06/16/19 1200  vancomycin (VANCOCIN) IVPB 1000 mg/200 mL premix     1,000 mg 200 mL/hr over 60 Minutes Intravenous Every T-Th-Sa (Hemodialysis) 06/15/19 1904     06/15/19 2030  vancomycin (VANCOCIN) 2,000 mg in sodium chloride 0.9 % 500 mL IVPB     2,000 mg 250 mL/hr over 120 Minutes Intravenous  Once 06/15/19 1902 06/16/19 0010   06/15/19 2000  ceFEPIme (MAXIPIME) 2 g in sodium chloride 0.9 % 100 mL IVPB     2 g 200 mL/hr over 30 Minutes Intravenous  Once 06/15/19 1902 06/15/19 2028   06/08/19 0600  cefUROXime (ZINACEF) 1.5 g in sodium chloride 0.9 % 100 mL IVPB     1.5 g  200 mL/hr over 30 Minutes Intravenous On call to O.R. 06/07/19 1519 06/08/19 1328       Objective: Vitals:   06/16/19 2229 06/17/19 0523 06/17/19 0841 06/17/19 1155  BP: 107/67 (!) 159/37 (!) 159/63 (!) 145/45  Pulse:  74 76 73  Resp:  (!) 22 16 17   Temp:  100.1 F (37.8 C) 99.1 F (37.3 C) 98.6 F (37 C)  TempSrc:  Oral Oral Oral  SpO2:  95% 98% 97%  Weight:  98 kg    Height:        Intake/Output Summary (Last 24 hours) at 06/17/2019 1226 Last data filed at 06/16/2019 2230 Gross per 24 hour  Intake 2647 ml  Output -  Net 2647 ml   Filed Weights   06/16/19 0700 06/16/19 1106 06/17/19 0523  Weight: 99.8 kg 99.7 kg 98 kg    Examination: General exam: Appears comfortable  HEENT: PERRLA, oral mucosa moist, no sclera  icterus or thrush Respiratory system: Clear to auscultation. Respiratory effort normal. Cardiovascular system: S1 & S2 heard,  No murmurs  Gastrointestinal system: Abdomen soft, non-tender, nondistended. Normal bowel sounds   Central nervous system: Alert and oriented. No focal neurological deficits. Extremities: No cyanosis, clubbing or edema Skin: No rashes or ulcers Psychiatry:  Mood & affect appropriate.  Skin:  Wound on mid back    Psychiatry:  Mood & affect appropriate.      Data Reviewed: I have personally reviewed following labs and imaging studies  CBC: Recent Labs  Lab 06/13/19 0458 06/15/19 1827 06/16/19 0711 06/17/19 0502  WBC 20.8* 17.4* 16.2* 16.0*  HGB 7.7* 7.9* 7.3* 8.0*  HCT 26.1* 26.7* 25.1* 27.2*  MCV 87.0 87.8 87.8 88.9  PLT 242 234 239 144   Basic Metabolic Panel: Recent Labs  Lab 06/13/19 0458 06/16/19 0712  NA 132* 131*  K 3.5 3.8  CL 96* 95*  CO2 23 23  GLUCOSE 95 95  BUN 34* 40*  CREATININE 4.50* 5.82*  CALCIUM 8.4* 8.2*  PHOS 3.8 4.2   GFR: Estimated Creatinine Clearance: 9.2 mL/min (A) (by C-G formula based on SCr of 5.82 mg/dL (H)). Liver Function Tests: Recent Labs  Lab 06/13/19 0458 06/16/19 0712  ALBUMIN 1.7* 1.7*   No results for input(s): LIPASE, AMYLASE in the last 168 hours. No results for input(s): AMMONIA in the last 168 hours. Coagulation Profile: No results for input(s): INR, PROTIME in the last 168 hours. Cardiac Enzymes: No results for input(s): CKTOTAL, CKMB, CKMBINDEX, TROPONINI in the last 168 hours. BNP (last 3 results) No results for input(s): PROBNP in the last 8760 hours. HbA1C: No results for input(s): HGBA1C in the last 72 hours. CBG: Recent Labs  Lab 06/16/19 0623 06/16/19 1208 06/16/19 1623 06/16/19 2229 06/17/19 0545  GLUCAP 92 107* 90 105* 111*   Lipid Profile: No results for input(s): CHOL, HDL, LDLCALC, TRIG, CHOLHDL, LDLDIRECT in the last 72 hours. Thyroid Function Tests: No results  for input(s): TSH, T4TOTAL, FREET4, T3FREE, THYROIDAB in the last 72 hours. Anemia Panel: No results for input(s): VITAMINB12, FOLATE, FERRITIN, TIBC, IRON, RETICCTPCT in the last 72 hours. Urine analysis:    Component Value Date/Time   COLORURINE AMBER (A) 06/15/2019 1800   APPEARANCEUR CLOUDY (A) 06/15/2019 1800   APPEARANCEUR Cloudy 12/14/2013 1727   LABSPEC 1.026 06/15/2019 1800   LABSPEC 1.012 12/14/2013 1727   PHURINE 5.0 06/15/2019 1800   GLUCOSEU 50 (A) 06/15/2019 1800   GLUCOSEU NEGATIVE 07/21/2017 1001   HGBUR NEGATIVE 06/15/2019 1800  BILIRUBINUR NEGATIVE 06/15/2019 1800   BILIRUBINUR Negative 12/14/2013 1727   KETONESUR NEGATIVE 06/15/2019 1800   PROTEINUR >=300 (A) 06/15/2019 1800   UROBILINOGEN 0.2 07/21/2017 1001   NITRITE NEGATIVE 06/15/2019 1800   LEUKOCYTESUR MODERATE (A) 06/15/2019 1800   LEUKOCYTESUR 3+ 12/14/2013 1727   Sepsis Labs: @LABRCNTIP (procalcitonin:4,lacticidven:4) ) Recent Results (from the past 240 hour(s))  SARS CORONAVIRUS 2 (TAT 6-24 HRS) Nasopharyngeal Nasopharyngeal Swab     Status: None   Collection Time: 06/08/19  2:06 AM   Specimen: Nasopharyngeal Swab  Result Value Ref Range Status   SARS Coronavirus 2 NEGATIVE NEGATIVE Final    Comment: (NOTE) SARS-CoV-2 target nucleic acids are NOT DETECTED. The SARS-CoV-2 RNA is generally detectable in upper and lower respiratory specimens during the acute phase of infection. Negative results do not preclude SARS-CoV-2 infection, do not rule out co-infections with other pathogens, and should not be used as the sole basis for treatment or other patient management decisions. Negative results must be combined with clinical observations, patient history, and epidemiological information. The expected result is Negative. Fact Sheet for Patients: SugarRoll.be Fact Sheet for Healthcare Providers: https://www.woods-mathews.com/ This test is not yet approved or  cleared by the [place] FDA and  has been authorized for detection and/or diagnosis of SARS-CoV-2 by FDA under an Emergency Use Authorization (EUA). This EUA will remain  in effect (meaning this test can be used) for the duration of the COVID-19 declaration under Section 56 4(b)(1) of the Act, 21 U.S.C. section 360bbb-3(b)(1), unless the authorization is terminated or revoked sooner. Performed at [facility], [place] [address], [place], [place] [address]   Surgical PCR screen     Status: None   Collection Time: 06/08/19  2:10 AM   Specimen: Nasal Mucosa; Nasal Swab  Result Value Ref Range Status   MRSA, PCR NEGATIVE NEGATIVE Final   Staphylococcus aureus NEGATIVE NEGATIVE Final    Comment: (NOTE) The Xpert SA Assay (FDA approved for NASAL specimens in patients 55 years of age and older), is one component of a comprehensive surveillance program. It is not intended to diagnose infection nor to guide or monitor treatment. Performed at [facility], [place] [address], [place], [place] [address]   SARS CORONAVIRUS 2 (TAT 6-24 HRS) Nasopharyngeal Nasopharyngeal Swab     Status: None   Collection Time: 06/15/19 11:20 AM   Specimen: Nasopharyngeal Swab  Result Value Ref Range Status   SARS Coronavirus 2 NEGATIVE NEGATIVE Final    Comment: (NOTE) SARS-CoV-2 target nucleic acids are NOT DETECTED. The SARS-CoV-2 RNA is generally detectable in upper and lower respiratory specimens during the acute phase of infection. Negative results do not preclude SARS-CoV-2 infection, do not rule out co-infections with other pathogens, and should not be used as the sole basis for treatment or other patient management decisions. Negative results must be combined with clinical observations, patient history, and epidemiological information. The expected result is Negative. Fact Sheet for Patients: SugarRoll.be Fact Sheet for Healthcare Providers:  https://www.woods-mathews.com/ This test is not yet approved or cleared by the [place] FDA and  has been authorized for detection and/or diagnosis of SARS-CoV-2 by FDA under an Emergency Use Authorization (EUA). This EUA will remain  in effect (meaning this test can be used) for the duration of the COVID-19 declaration under Section 56 4(b)(1) of the Act, 21 U.S.C. section 360bbb-3(b)(1), unless the authorization is terminated or revoked sooner. Performed at [facility], [place] [address] [place], [place] [address]  Culture, blood (routine x 2)     Status: None (Preliminary result)   Collection Time: 06/15/19 12:45 PM   Specimen: BLOOD  Result Value Ref Range Status   Specimen Description BLOOD RIGHT ANTECUBITAL  Final   Special Requests   Final    BOTTLES DRAWN AEROBIC ONLY Blood Culture results may not be optimal due to an inadequate volume of blood received in culture bottles   Culture   Final    NO GROWTH 2 DAYS Performed at [facility], [place] [address], [place], [place] [address]    Report Status PENDING  Incomplete  Culture, blood (routine x 2)     Status: None (Preliminary result)   Collection Time: 06/15/19 12:49 PM   Specimen: BLOOD  Result Value Ref Range Status   Specimen Description BLOOD RIGHT ANTECUBITAL  Final   Special Requests   Final    BOTTLES DRAWN AEROBIC ONLY Blood Culture results may not be optimal due to an inadequate volume of blood received in culture bottles   Culture   Final    NO GROWTH 2 DAYS Performed at [facility], [place] [address], [place], [place] [address]    Report Status PENDING  Incomplete  Culture, Urine     Status: Abnormal   Collection Time: 06/15/19  6:49 PM   Specimen: Urine, Random  Result Value Ref Range Status   Specimen Description URINE, RANDOM  Final   Special Requests   Final    NONE Performed at [facility], [place] [address] [place], [place] [address]    Culture >=100,000  COLONIES/mL YEAST (A)  Final   Report Status 06/17/2019 FINAL  Final         Radiology Studies: No results found.    Scheduled Meds: . amLODipine  10 mg Oral Daily  . atorvastatin  40 mg Oral q1800  . Chlorhexidine Gluconate Cloth  6 each Topical Q0600  . Chlorhexidine Gluconate Cloth  6 each Topical Q0600  . [START ON 06/18/2019] darbepoetin (ARANESP) injection - DIALYSIS  150 mcg Intravenous Q Sat-HD  . gabapentin  200 mg Oral QHS  . heparin  5,000 Units Subcutaneous Q8H  . hydrALAZINE  100 mg Oral Q8H  . labetalol  200 mg Oral BID  . levothyroxine  100 mcg Oral QAC breakfast   Continuous Infusions: . sodium chloride 10 mL/hr at 06/15/19 1958  . ceFEPime (MAXIPIME) IV 2 g (06/16/19 1743)  . vancomycin Stopped (06/16/19 1126)     LOS: 18 days      Debbe Odea, MD [facility] Pager: [email or web address] Password [facility] 06/17/2019, 12:26 PM

## 2019-06-17 NOTE — Plan of Care (Signed)
  Problem: Education: Goal: Knowledge of General Education information will improve Description Including pain rating scale, medication(s)/side effects and non-pharmacologic comfort measures Outcome: Progressing   

## 2019-06-17 NOTE — Progress Notes (Signed)
[facility] for Infectious Disease   Reason for visit: Follow up on wound drainage  Interval History: continued drainage in back.  WBC improved at 16, fever curve declining.  No acute events.  No associated rash or diarrhea.    Physical Exam: Constitutional:  Vitals:   06/17/19 0841 06/17/19 1155  BP: (!) 159/63 (!) 145/45  Pulse: 76 73  Resp: 16 17  Temp: 99.1 F (37.3 C) 98.6 F (37 C)  SpO2: 98% 97%   patient appears in NAD Respiratory: Normal respiratory effort; CTA B Cardiovascular: RRR GI: soft, nt, nd Back: wrapped  Review of Systems: Constitutional: negative for chills Gastrointestinal: negative for diarrhea  Lab Results  Component Value Date   WBC 16.0 (H) 06/17/2019   HGB 8.0 (L) 06/17/2019   HCT 27.2 (L) 06/17/2019   MCV 88.9 06/17/2019   PLT 239 06/17/2019    Lab Results  Component Value Date   CREATININE 5.82 (H) 06/16/2019   BUN 40 (H) 06/16/2019   NA 131 (L) 06/16/2019   K 3.8 06/16/2019   CL 95 (L) 06/16/2019   CO2 23 06/16/2019    Lab Results  Component Value Date   ALT 16 06/06/2019   AST 18 06/06/2019   ALKPHOS 45 06/06/2019     Microbiology: Recent Results (from the past 240 hour(s))  SARS CORONAVIRUS 2 (TAT 6-24 HRS) Nasopharyngeal Nasopharyngeal Swab     Status: None   Collection Time: 06/08/19  2:06 AM   Specimen: Nasopharyngeal Swab  Result Value Ref Range Status   SARS Coronavirus 2 NEGATIVE NEGATIVE Final    Comment: (NOTE) SARS-CoV-2 target nucleic acids are NOT DETECTED. The SARS-CoV-2 RNA is generally detectable in upper and lower respiratory specimens during the acute phase of infection. Negative results do not preclude SARS-CoV-2 infection, do not rule out co-infections with other pathogens, and should not be used as the sole basis for treatment or other patient management decisions. Negative results must be combined with clinical observations, patient history, and epidemiological information. The expected  result is Negative. Fact Sheet for Patients: SugarRoll.be Fact Sheet for Healthcare Providers: https://www.woods-mathews.com/ This test is not yet approved or cleared by the [place] FDA and  has been authorized for detection and/or diagnosis of SARS-CoV-2 by FDA under an Emergency Use Authorization (EUA). This EUA will remain  in effect (meaning this test can be used) for the duration of the COVID-19 declaration under Section 56 4(b)(1) of the Act, 21 U.S.C. section 360bbb-3(b)(1), unless the authorization is terminated or revoked sooner. Performed at [facility], [place] [address] [place], [place] [address]   Surgical PCR screen     Status: None   Collection Time: 06/08/19  2:10 AM   Specimen: Nasal Mucosa; Nasal Swab  Result Value Ref Range Status   MRSA, PCR NEGATIVE NEGATIVE Final   Staphylococcus aureus NEGATIVE NEGATIVE Final    Comment: (NOTE) The Xpert SA Assay (FDA approved for NASAL specimens in patients 41 years of age and older), is one component of a comprehensive surveillance program. It is not intended to diagnose infection nor to guide or monitor treatment. Performed at [facility], [place] [address] [place] [address], [place], [place] [address]   SARS CORONAVIRUS 2 (TAT 6-24 HRS) Nasopharyngeal Nasopharyngeal Swab     Status: None   Collection Time: 06/15/19 11:20 AM   Specimen: Nasopharyngeal Swab  Result Value Ref Range Status   SARS Coronavirus 2 NEGATIVE NEGATIVE Final    Comment: (NOTE) SARS-CoV-2 target  nucleic acids are NOT DETECTED. The SARS-CoV-2 RNA is generally detectable in upper and lower respiratory specimens during the acute phase of infection. Negative results do not preclude SARS-CoV-2 infection, do not rule out co-infections with other pathogens, and should not be used as the sole basis for treatment or other patient management decisions. Negative results must be combined with clinical observations,  patient history, and epidemiological information. The expected result is Negative. Fact Sheet for Patients: SugarRoll.be Fact Sheet for Healthcare Providers: https://www.woods-mathews.com/ This test is not yet approved or cleared by the [place] FDA and  has been authorized for detection and/or diagnosis of SARS-CoV-2 by FDA under an Emergency Use Authorization (EUA). This EUA will remain  in effect (meaning this test can be used) for the duration of the COVID-19 declaration under Section 56 4(b)(1) of the Act, 21 U.S.C. section 360bbb-3(b)(1), unless the authorization is terminated or revoked sooner. Performed at [facility], [place] [address], [place], [place] [address]   Culture, blood (routine x 2)     Status: None (Preliminary result)   Collection Time: 06/15/19 12:45 PM   Specimen: BLOOD  Result Value Ref Range Status   Specimen Description BLOOD RIGHT ANTECUBITAL  Final   Special Requests   Final    BOTTLES DRAWN AEROBIC ONLY Blood Culture results may not be optimal due to an inadequate volume of blood received in culture bottles   Culture   Final    NO GROWTH 2 DAYS Performed at [facility], [place] [address] [place], [place] [address]    Report Status PENDING  Incomplete  Culture, blood (routine x 2)     Status: None (Preliminary result)   Collection Time: 06/15/19 12:49 PM   Specimen: BLOOD  Result Value Ref Range Status   Specimen Description BLOOD RIGHT ANTECUBITAL  Final   Special Requests   Final    BOTTLES DRAWN AEROBIC ONLY Blood Culture results may not be optimal due to an inadequate volume of blood received in culture bottles   Culture   Final    NO GROWTH 2 DAYS Performed at [facility], [place] [address] [place], [place] [address]    Report Status PENDING  Incomplete  Culture, Urine     Status: Abnormal   Collection Time: 06/15/19  6:49 PM   Specimen: Urine, Random  Result Value Ref Range  Status   Specimen Description URINE, RANDOM  Final   Special Requests   Final    NONE Performed at [facility], [place] [address], [place],  [address]    Culture >=100,000 COLONIES/mL YEAST (A)  Final   Report Status 06/17/2019 FINAL  Final    Impression/Plan:  1. Chronic, non-healing draining wound - This has been an ongoing issue and she has undergone multiple debridements.  Her cultures have revealed Proteus in the past on multiple occassions.  I am most concerned about colonized bacteria on hardware and I have discussed this with Dr. Kathyrn Sheriff.  This would require good fusion and a CT scan has been done for this.  Will wait for Dr. Cleotilde Neer assessment of the CT for consideration of surgery, if possible.   Will continue with broad spectrum antibiotics  2.  ESRD - getting antibiotics with dialysis.    3.  Fever - improving and may be back vs other source.  Urine noted and has WBCs but also protein and is growing yeast.  Not c/w infection.

## 2019-06-18 LAB — BASIC METABOLIC PANEL
Anion gap: 16 — ABNORMAL HIGH (ref 5–15)
BUN: 31 mg/dL — ABNORMAL HIGH (ref 8–23)
CO2: 23 mmol/L (ref 22–32)
Calcium: 8.5 mg/dL — ABNORMAL LOW (ref 8.9–10.3)
Chloride: 93 mmol/L — ABNORMAL LOW (ref 98–111)
Creatinine, Ser: 4.9 mg/dL — ABNORMAL HIGH (ref 0.44–1.00)
GFR calc Af Amer: 9 mL/min — ABNORMAL LOW (ref >60)
GFR calc non Af Amer: 8 mL/min — ABNORMAL LOW (ref >60)
Glucose, Bld: 87 mg/dL (ref 70–99)
Potassium: 4 mmol/L (ref 3.5–5.1)
Sodium: 132 mmol/L — ABNORMAL LOW (ref 135–145)

## 2019-06-18 LAB — CBC
HCT: 27.6 % — ABNORMAL LOW (ref 36.0–46.0)
Hemoglobin: 8.2 g/dL — ABNORMAL LOW (ref 12.0–15.0)
MCH: 25.9 pg — ABNORMAL LOW (ref 26.0–34.0)
MCHC: 29.7 g/dL — ABNORMAL LOW (ref 30.0–36.0)
MCV: 87.1 fL (ref 80.0–100.0)
Platelets: 244 10*3/uL (ref 150–400)
RBC: 3.17 MIL/uL — ABNORMAL LOW (ref 3.87–5.11)
RDW: 16.8 % — ABNORMAL HIGH (ref 11.5–15.5)
WBC: 13.2 10*3/uL — ABNORMAL HIGH (ref 4.0–10.5)
nRBC: 0 % (ref 0.0–0.2)

## 2019-06-18 LAB — GLUCOSE, CAPILLARY
Glucose-Capillary: 85 mg/dL (ref 70–99)
Glucose-Capillary: 84 mg/dL (ref 70–99)
Glucose-Capillary: 83 mg/dL (ref 70–99)

## 2019-06-18 MED ORDER — HEPARIN SODIUM (PORCINE) 1000 UNIT/ML IJ SOLN
INTRAMUSCULAR | Status: AC
  Administered 2019-06-18: 3400 [IU]
  Filled 2019-06-18: qty 4

## 2019-06-18 MED ORDER — HEPARIN SODIUM (PORCINE) 1000 UNIT/ML IJ SOLN
INTRAMUSCULAR | Status: AC
  Administered 2019-06-18: 1000 [IU]
  Filled 2019-06-18: qty 2

## 2019-06-18 MED ORDER — DARBEPOETIN ALFA 150 MCG/0.3ML IJ SOSY
PREFILLED_SYRINGE | INTRAMUSCULAR | Status: AC
  Administered 2019-06-18: 150 ug via INTRAVENOUS
  Filled 2019-06-18: qty 0.3

## 2019-06-18 NOTE — Progress Notes (Signed)
Pt refused blood sugar check. Will continue to monitor.

## 2019-06-18 NOTE — Progress Notes (Signed)
Pt is refusing  VS signs check and night time medications, very irritable and upset. Provider on call has been notified Via Amion. Will monitor patient closely.

## 2019-06-18 NOTE — Progress Notes (Signed)
PROGRESS NOTE    Taylor Tyler   WLN:[id number]  DOB: July 09, 1941  DOA: 05/30/2019 PCP: McLean-Scocuzza, Nino Glow, MD   Brief Narrative:  Taylor Tyler is a 78 yo with diabetes mellitus type 2 presently not on medication, chronic diastolic hypertension, chronic kidney disease stage III, anemia, hypothyroidism, hyperlipidemia.   She presented to the ED on 10/26 for dyspnea after a fall out of bed the night before and being on the floor for about 8 hrs. Her son found her in the AM. She was short of breath for at least 2 days.   In ED, noted to be wheezing. Suspected to have acute diastolic CHF and started on Lasix.  Also noted to have AKI with Cr of 5.74 and K of 5.3.  Past Cr 2.64.   Subjective: Taylor Tyler has no complaints today.    Assessment & Plan:   Principal Problem:  Acute diastolic CHF (congestive heart failure)   Acute on chronic kidney disease stage 4 >> ESRD Metabolic acidosis - nephrology consulted - she did not diurese well - 11/4 - tunneled dialysis cath placed and underwent dialysis - has been determined to need permanent dialysis -an outpt dialysis spot has been found- her schedule will be Tu/Th/Sat  Active Problems: Leukocytosis, fever > Sepsis from infected back wound - temp spike 103 on 11/11  - draining wound on lumbar spine related to lumbar fusion surgery in 2018- NS consulted on 11/12- plan for CT L spine to check hardware in spine- Dr Ellene Route feels she need and I and D and removal of hardware - ID consulted 11/12 and assisting with antibiotics - COVID test done for admission to SNF on 11/11 was negative - UA shows rare bacteria and > 50 wbc with yeast - Urine culture shows > 100 K yeast  Possible acute encephalopathy with underlying dementia - may be due to sepsis- following   Hypothyroidism  - cont Synthroid  DM? - last A1c was 5.2- not on medication at home - sugars have been normal - stopped fingersticks  AOCD - appreciate management per renal      Pressure ulcer- stage 2 left buttock - cont wound care   HTN - on Norvasc, Labetalol, Hydralazine  HLD - Lipitor  Anemia of chronic disease - following   Time spent in minutes: 35 min   DVT prophylaxis:Heparin Code Status: Full code Family Communication:  Son Brae Schaafsma Disposition Plan: SNF Consultants:   Nephrology  Vascular surgery  NS Procedures:    11/4 tunneled cath Antimicrobials:  Anti-infectives (From admission, onward)   Start     Dose/Rate Route Frequency Ordered Stop   06/16/19 1800  ceFEPIme (MAXIPIME) 2 g in sodium chloride 0.9 % 100 mL IVPB     2 g 200 mL/hr over 30 Minutes Intravenous Once per day on Tue Thu Sat 06/15/19 1904     06/16/19 1200  vancomycin (VANCOCIN) IVPB 1000 mg/200 mL premix     1,000 mg 200 mL/hr over 60 Minutes Intravenous Every T-Th-Sa (Hemodialysis) 06/15/19 1904     06/15/19 2030  vancomycin (VANCOCIN) 2,000 mg in sodium chloride 0.9 % 500 mL IVPB     2,000 mg 250 mL/hr over 120 Minutes Intravenous  Once 06/15/19 1902 06/16/19 0010   06/15/19 2000  ceFEPIme (MAXIPIME) 2 g in sodium chloride 0.9 % 100 mL IVPB     2 g 200 mL/hr over 30 Minutes Intravenous  Once 06/15/19 1902 06/15/19 2028   06/08/19 0600  cefUROXime (ZINACEF)  1.5 g in sodium chloride 0.9 % 100 mL IVPB     1.5 g 200 mL/hr over 30 Minutes Intravenous On call to O.R. 06/07/19 1519 06/08/19 1328       Objective: Vitals:   06/17/19 1655 06/17/19 2108 06/18/19 0641 06/18/19 0642  BP: (!) 160/51 (!) 145/51  (!) 151/45  Pulse: 73 66  64  Resp: 20 18  20   Temp: (!) 100.5 F (38.1 C) 97.8 F (36.6 C)  (!) 97.5 F (36.4 C)  TempSrc: Oral Oral  Oral  SpO2: 95% 98%  100%  Weight:   96.2 kg   Height:        Intake/Output Summary (Last 24 hours) at 06/18/2019 1134 Last data filed at 06/18/2019 0644 Gross per 24 hour  Intake 223.1 ml  Output 100 ml  Net 123.1 ml   Filed Weights   06/16/19 1106 06/17/19 0523 06/18/19 0641  Weight: 99.7 kg 98 kg 96.2  kg    Examination: General exam: Appears comfortable  HEENT: PERRLA, oral mucosa moist, no sclera icterus or thrush Respiratory system: Clear to auscultation. Respiratory effort normal. Cardiovascular system: S1 & S2 heard,  No murmurs  Gastrointestinal system: Abdomen soft, non-tender, nondistended. Normal bowel sounds   Central nervous system: Alert and oriented to person only. No focal neurological deficits. Extremities: No cyanosis, clubbing or edema Skin: No rashes or ulcers Psychiatry:  Mood & affect appropriate.     Psychiatry:  Mood & affect appropriate.      Data Reviewed: I have personally reviewed following labs and imaging studies  CBC: Recent Labs  Lab 06/13/19 0458 06/15/19 1827 06/16/19 0711 06/17/19 0502 06/18/19 0541  WBC 20.8* 17.4* 16.2* 16.0* 13.2*  HGB 7.7* 7.9* 7.3* 8.0* 8.2*  HCT 26.1* 26.7* 25.1* 27.2* 27.6*  MCV 87.0 87.8 87.8 88.9 87.1  PLT 242 234 239 239 657   Basic Metabolic Panel: Recent Labs  Lab 06/13/19 0458 06/16/19 0712 06/18/19 0541  NA 132* 131* 132*  K 3.5 3.8 4.0  CL 96* 95* 93*  CO2 23 23 23   GLUCOSE 95 95 87  BUN 34* 40* 31*  CREATININE 4.50* 5.82* 4.90*  CALCIUM 8.4* 8.2* 8.5*  PHOS 3.8 4.2  --    GFR: Estimated Creatinine Clearance: 10.8 mL/min (A) (by C-G formula based on SCr of 4.9 mg/dL (H)). Liver Function Tests: Recent Labs  Lab 06/13/19 0458 06/16/19 0712  ALBUMIN 1.7* 1.7*   No results for input(s): LIPASE, AMYLASE in the last 168 hours. No results for input(s): AMMONIA in the last 168 hours. Coagulation Profile: No results for input(s): INR, PROTIME in the last 168 hours. Cardiac Enzymes: No results for input(s): CKTOTAL, CKMB, CKMBINDEX, TROPONINI in the last 168 hours. BNP (last 3 results) No results for input(s): PROBNP in the last 8760 hours. HbA1C: No results for input(s): HGBA1C in the last 72 hours. CBG: Recent Labs  Lab 06/17/19 0545 06/17/19 1159 06/17/19 1652 06/17/19 2109  06/18/19 0645  GLUCAP 111* 107* 88 73 85   Lipid Profile: No results for input(s): CHOL, HDL, LDLCALC, TRIG, CHOLHDL, LDLDIRECT in the last 72 hours. Thyroid Function Tests: No results for input(s): TSH, T4TOTAL, FREET4, T3FREE, THYROIDAB in the last 72 hours. Anemia Panel: No results for input(s): VITAMINB12, FOLATE, FERRITIN, TIBC, IRON, RETICCTPCT in the last 72 hours. Urine analysis:    Component Value Date/Time   COLORURINE AMBER (A) 06/15/2019 1800   APPEARANCEUR CLOUDY (A) 06/15/2019 1800   APPEARANCEUR Cloudy 12/14/2013 1727   LABSPEC 1.026  06/15/2019 1800   LABSPEC 1.012 12/14/2013 1727   PHURINE 5.0 06/15/2019 1800   GLUCOSEU 50 (A) 06/15/2019 1800   GLUCOSEU NEGATIVE 07/21/2017 1001   HGBUR NEGATIVE 06/15/2019 1800   BILIRUBINUR NEGATIVE 06/15/2019 1800   BILIRUBINUR Negative 12/14/2013 1727   KETONESUR NEGATIVE 06/15/2019 1800   PROTEINUR >=300 (A) 06/15/2019 1800   UROBILINOGEN 0.2 07/21/2017 1001   NITRITE NEGATIVE 06/15/2019 1800   LEUKOCYTESUR MODERATE (A) 06/15/2019 1800   LEUKOCYTESUR 3+ 12/14/2013 1727   Sepsis Labs: @LABRCNTIP (procalcitonin:4,lacticidven:4) ) Recent Results (from the past 240 hour(s))  SARS CORONAVIRUS 2 (TAT 6-24 HRS) Nasopharyngeal Nasopharyngeal Swab     Status: None   Collection Time: 06/15/19 11:20 AM   Specimen: Nasopharyngeal Swab  Result Value Ref Range Status   SARS Coronavirus 2 NEGATIVE NEGATIVE Final    Comment: (NOTE) SARS-CoV-2 target nucleic acids are NOT DETECTED. The SARS-CoV-2 RNA is generally detectable in upper and lower respiratory specimens during the acute phase of infection. Negative results do not preclude SARS-CoV-2 infection, do not rule out co-infections with other pathogens, and should not be used as the sole basis for treatment or other patient management decisions. Negative results must be combined with clinical observations, patient history, and epidemiological information. The expected result is  Negative. Fact Sheet for Patients: SugarRoll.be Fact Sheet for Healthcare Providers: https://www.woods-mathews.com/ This test is not yet approved or cleared by the [place] FDA and  has been authorized for detection and/or diagnosis of SARS-CoV-2 by FDA under an Emergency Use Authorization (EUA). This EUA will remain  in effect (meaning this test can be used) for the duration of the COVID-19 declaration under Section 56 4(b)(1) of the Act, 21 U.S.C. section 360bbb-3(b)(1), unless the authorization is terminated or revoked sooner. Performed at [facility], [place] [address], [place], [place] [address]   Culture, blood (routine x 2)     Status: None (Preliminary result)   Collection Time: 06/15/19 12:45 PM   Specimen: BLOOD  Result Value Ref Range Status   Specimen Description BLOOD RIGHT ANTECUBITAL  Final   Special Requests   Final    BOTTLES DRAWN AEROBIC ONLY Blood Culture results may not be optimal due to an inadequate volume of blood received in culture bottles   Culture   Final    NO GROWTH 2 DAYS Performed at [facility], [place] [address], [place], [place] [address]    Report Status PENDING  Incomplete  Culture, blood (routine x 2)     Status: None (Preliminary result)   Collection Time: 06/15/19 12:49 PM   Specimen: BLOOD  Result Value Ref Range Status   Specimen Description BLOOD RIGHT ANTECUBITAL  Final   Special Requests   Final    BOTTLES DRAWN AEROBIC ONLY Blood Culture results may not be optimal due to an inadequate volume of blood received in culture bottles   Culture   Final    NO GROWTH 2 DAYS Performed at [facility], [place] [address], [place], [place] [address]    Report Status PENDING  Incomplete  Culture, Urine     Status: Abnormal   Collection Time: 06/15/19  6:49 PM   Specimen: Urine, Random  Result Value Ref Range Status   Specimen Description URINE, RANDOM  Final   Special Requests   Final      NONE Performed at [facility], [place] [address], [place], [place] [address]    Culture >=100,000 COLONIES/mL YEAST (A)  Final  Report Status 06/17/2019 FINAL  Final         Radiology Studies: Ct Lumbar Spine Wo Contrast  Result Date: 06/17/2019 CLINICAL DATA:  Follow-up lumbar fusion. EXAM: CT LUMBAR SPINE WITHOUT CONTRAST TECHNIQUE: Multidetector CT imaging of the lumbar spine was performed without intravenous contrast administration. Multiplanar CT image reconstructions were also generated. COMPARISON:  MRI 05/20/2019.  CT 02/24/2017. FINDINGS: Segmentation: 5 lumbar type vertebral bodies as numbered previously. Alignment: Normal Vertebrae: No regional fracture. Previous pedicle screws and posterior rods from L3 through S1. Discectomy and fusion material at L3-4, L4-5 and L5-S1. Anterior plate and screws at U2-P5. Paraspinal and other soft tissues: There is fluid extending as better shown at MRI, posteriorly from the soft tissues posterior to the surgical region extending to the skin surface. This is not well characterized using ordinary CT. Disc levels: T11-12 and T12-L1: Normal. L1-2: Disc degeneration with vacuum phenomenon. Mild bulging of the disc. Mild facet osteoarthritis. No compressive stenosis. L2-3: Disc degeneration with bulging of the disc. Bilateral facet hypertrophy. Stenosis of the lateral recesses and foramina, left more than right. Neural compression could occur at this level, more likely on the left. L3 to sacrum: There appears to be solid fusion across the disc space at L3-4. There is lucency around the L3 screws, which is surprising given the appearance of solid union. No loosening of the L4 screws. At L4-5, there is no sign of motion. I cannot definitely establish solid bone union across the disc space, but absence of signs of motion suggest union. At L5-S1, there is no screw loosening. There is solid union across the disc space. IMPRESSION: Satisfactory appearance in  the fusion segment from L3 to the sacrum. Definite solid union at L3-4 and L5-S1. Lucency around the screws at L3 is difficult to explain given the appearance of solid union across the disc space in the absence of other signs of motion. At L4-5, there is no screw loosening and presumably there is solid union. Definite solid union at L5-S1. Satisfactory patency of the canal and foramina throughout that segment. Adjacent segment degenerative changes at L2-3 with bulging of the disc and facet and ligamentous hypertrophy resulting in narrowing of the lateral recesses and foramina left more than right that could be symptomatic. Electronically Signed   By: Nelson Chimes M.D.   On: 06/17/2019 14:28      Scheduled Meds:  amLODipine  10 mg Oral Daily   atorvastatin  40 mg Oral q1800   Chlorhexidine Gluconate Cloth  6 each Topical Q0600   Chlorhexidine Gluconate Cloth  6 each Topical Q0600   darbepoetin (ARANESP) injection - DIALYSIS  150 mcg Intravenous Q Sat-HD   gabapentin  200 mg Oral QHS   heparin  5,000 Units Subcutaneous Q8H   hydrALAZINE  100 mg Oral Q8H   labetalol  200 mg Oral BID   levothyroxine  100 mcg Oral QAC breakfast   Continuous Infusions:  sodium chloride 10 mL/hr at 06/15/19 1958   ceFEPime (MAXIPIME) IV 2 g (06/16/19 1743)   vancomycin Stopped (06/16/19 1126)     LOS: 19 days      Debbe Odea, MD [facility] Pager: [email or web address] Password [facility] 06/18/2019, 11:34 AM

## 2019-06-18 NOTE — Procedures (Signed)
I was present at this dialysis session. I have reviewed the session itself and made appropriate changes.   1.5L UF goal. 3K bath. Tol well.    Filed Weights   06/16/19 1106 06/17/19 0523 06/18/19 0641  Weight: 99.7 kg 98 kg 96.2 kg    Recent Labs  Lab 06/16/19 0712 06/18/19 0541  NA 131* 132*  K 3.8 4.0  CL 95* 93*  CO2 23 23  GLUCOSE 95 87  BUN 40* 31*  CREATININE 5.82* 4.90*  CALCIUM 8.2* 8.5*  PHOS 4.2  --     Recent Labs  Lab 06/16/19 0711 06/17/19 0502 06/18/19 0541  WBC 16.2* 16.0* 13.2*  HGB 7.3* 8.0* 8.2*  HCT 25.1* 27.2* 27.6*  MCV 87.8 88.9 87.1  PLT 239 239 244    Scheduled Meds: . amLODipine  10 mg Oral Daily  . atorvastatin  40 mg Oral q1800  . Chlorhexidine Gluconate Cloth  6 each Topical Q0600  . Chlorhexidine Gluconate Cloth  6 each Topical Q0600  . darbepoetin (ARANESP) injection - DIALYSIS  150 mcg Intravenous Q Sat-HD  . gabapentin  200 mg Oral QHS  . heparin  5,000 Units Subcutaneous Q8H  . hydrALAZINE  100 mg Oral Q8H  . labetalol  200 mg Oral BID  . levothyroxine  100 mcg Oral QAC breakfast   Continuous Infusions: . sodium chloride 10 mL/hr at 06/15/19 1958  . ceFEPime (MAXIPIME) IV 2 g (06/16/19 1743)  . vancomycin Stopped (06/16/19 1126)   PRN Meds:.acetaminophen **OR** acetaminophen, hydrALAZINE, HYDROcodone-acetaminophen, ipratropium, levalbuterol, ondansetron **OR** ondansetron (ZOFRAN) IV   Pearson Grippe  MD 06/18/2019, 8:05 AM

## 2019-06-18 NOTE — Progress Notes (Signed)
[name], female   DOB: Jul 06, 1941, 78 y.o.   MRN: [id number] Patient in hemodialysis currently. Review of CT scan demonstrates that fusion at L3 445 and 5 1 appear to be solid and stable though there is evidence of halo formation around the screws suggesting significant loosening of hardware.  In light of persistent wound drainage I suspect that revision and debridement with removal of hardware is an appropriate consideration.  Will leave that decision to Dr. Kathyrn Sheriff in the beginning of the week.  Continue supportive care.

## 2019-06-18 NOTE — Progress Notes (Signed)
Pharmacy Antibiotic Note  Taylor Tyler is a 78 y.o. female admitted on 05/30/2019 due to a fall at home and shortness of breath. 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  WBC trending down at 13.2, afebrile now with Tm 100.5. Currently receiving HD on TTS, last HD session today 11/14. Continuing broad spectrum abx for now per ID while awaiting assessment for possible surgery.  Plan: Cefepime 2g IV qHD TTS Vancomycin 1g IV qHD TTS Monitor clinical status, renal function and culture results daily.   Height: 5\' 3"  (160 cm) Weight: 212 lb (96.2 kg) IBW/kg (Calculated) : 52.4  Temp (24hrs), Avg:98.6 F (37 C), Min:97.5 F (36.4 C), Max:100.5 F (38.1 C)  Recent Labs  Lab 06/13/19 0458 06/15/19 1827 06/16/19 0711 06/16/19 0712 06/17/19 0502 06/18/19 0541  WBC 20.8* 17.4* 16.2*  --  16.0* 13.2*  CREATININE 4.50*  --   --  5.82*  --  4.90*  LATICACIDVEN  --  0.8  --   --   --   --     Estimated Creatinine Clearance: 10.8 mL/min (A) (by C-G formula based on SCr of 4.9 mg/dL (H)).    Allergies  Allergen Reactions  . Clams [Shellfish Allergy] Swelling and Other (See Comments)    THROAT SWELLS NECK TURNS RED  . Hydralazine Other (See Comments)    CHEST TIGHTNESS Patient has tolerated multiple doses of hydralazine since allergy was listed   . Norvasc [Amlodipine Besylate] Swelling    Leg edema   . Milk-Related Compounds Diarrhea    11/11 Blood Cx x2: ngtd 11/11 COVID: neg 11/4 surgical PCR: MRSA PCR negative, SA PCR negative  Richardine Service, PharmD PGY1 Pharmacy Resident Phone: [phone] 06/18/2019  10:42 AM  Please check [email or web address] for unit-specific pharmacy phone numbers.

## 2019-06-19 NOTE — Progress Notes (Signed)
The nurse tried to reach patient's family but not successful, left a message for Ollen Gross to call back in order relate the patient's condition.

## 2019-06-19 NOTE — Progress Notes (Addendum)
Patient is alert and is in no pain. Attempted to retrieve vitals signs and perform shift assessment, however unable to do so because patient did not want me to touch her and took the blood pressure cuff off of her arm. Declined AM medications. Older son visited her for about 10 mins but has left. MD paged.

## 2019-06-19 NOTE — Progress Notes (Signed)
Spoke to the younger son Ollen Gross about his mother's current condition in the hospital. Both sons are aware of her current orientation. Younger son relayed to me that the older son will be visiting his mother today.

## 2019-06-19 NOTE — Plan of Care (Signed)
  Problem: Education: Goal: Knowledge of General Education information will improve Description: Including pain rating scale, medication(s)/side effects and non-pharmacologic comfort measures Outcome: Not Progressing   Problem: Health Behavior/Discharge Planning: Goal: Ability to manage health-related needs will improve Outcome: Not Progressing   Problem: Clinical Measurements: Goal: Ability to maintain clinical measurements within normal limits will improve Outcome: Not Progressing Goal: Will remain free from infection Outcome: Not Progressing Goal: Diagnostic test results will improve Outcome: Not Progressing Goal: Respiratory complications will improve Outcome: Not Progressing Goal: Cardiovascular complication will be avoided Outcome: Not Progressing   Problem: Activity: Goal: Risk for activity intolerance will decrease Outcome: Not Progressing   Problem: Nutrition: Goal: Adequate nutrition will be maintained Outcome: Not Progressing   Problem: Coping: Goal: Level of anxiety will decrease Outcome: Not Progressing Note: Patient has refused care, medications and labs.   Problem: Elimination: Goal: Will not experience complications related to bowel motility Outcome: Not Progressing Goal: Will not experience complications related to urinary retention Outcome: Not Progressing   Problem: Pain Managment: Goal: General experience of comfort will improve Outcome: Not Progressing   Problem: Safety: Goal: Ability to remain free from injury will improve Outcome: Not Progressing   Problem: Skin Integrity: Goal: Risk for impaired skin integrity will decrease Outcome: Not Progressing

## 2019-06-19 NOTE — Progress Notes (Signed)
Pt refused all her bedtime medications, very irritable and agitated. Pt has been reoriented but there is no evidence of learning.

## 2019-06-19 NOTE — Progress Notes (Signed)
Attempted to give morning medication in hope that maybe different RN will be successful however, patient resistant and refusing care. Would not allow me to take her blood pressure and take morning medications. She is not willling to communicate eather. Primary nurse aware and MD Rizwan notified as well.

## 2019-06-19 NOTE — TOC Progression Note (Addendum)
Transition of Care [facility]) - Progression Note    Patient Details  Name: Taylor Tyler MRN: [id number] Date of Birth: 1940/10/02  Transition of [facility]) CM/SW [place], [place] Phone Number: 06/19/2019, 10:36 AM  Clinical Narrative:    Continue to await authorization for SNF placement.  Requested new COVID as last result was 11/11. Per MD pt still not medically stable, will need new test 24-48 hrs prior to dc.    Expected Discharge Plan: Skilled Nursing Facility Barriers to Discharge: Ship broker, Continued Medical Work up  Expected Discharge Plan and Services Expected Discharge Plan: [place] In-house Referral: NA Discharge Planning Services: CM Consult Post Acute Care Choice: [place] Living arrangements for the past 2 months: Single Family Home            DME Arranged: (NA) HH Arranged: RN, PT [place] Agency: [place] Date [facility] Agency Contacted: 06/10/19 Time [place]: [id number] Representative spoke with at [place]: cory   Social Determinants of Health ([place]) Interventions    Readmission Risk Interventions No flowsheet data found.

## 2019-06-19 NOTE — Progress Notes (Signed)
Pt's cardiac monitor leads came off and she is refusing the monitor to be put back. On call provider is aware.

## 2019-06-19 NOTE — Progress Notes (Signed)
Admit: 05/30/2019 LOS: 20  71F new ESRD, F/inc WBC ? Spinal hardware infected  Subjective:  . HD yesterday, uneventful . RCID and NSG following . CLIP pending her disposition, undetermined . No c/o from pt . AFebrile . BCx x2 NGTD > 3d   11/14 0701 - 11/15 0700 In: 95.4 [P.O.:50; I.V.:0; IV Piggyback:45.4] Out: -   Filed Weights   06/16/19 1106 06/17/19 0523 06/18/19 0641  Weight: 99.7 kg 98 kg 96.2 kg    Scheduled Meds: . amLODipine  10 mg Oral Daily  . atorvastatin  40 mg Oral q1800  . Chlorhexidine Gluconate Cloth  6 each Topical Q0600  . Chlorhexidine Gluconate Cloth  6 each Topical Q0600  . darbepoetin (ARANESP) injection - DIALYSIS  150 mcg Intravenous Q Sat-HD  . gabapentin  200 mg Oral QHS  . heparin  5,000 Units Subcutaneous Q8H  . hydrALAZINE  100 mg Oral Q8H  . labetalol  200 mg Oral BID  . levothyroxine  100 mcg Oral QAC breakfast   Continuous Infusions: . sodium chloride 10 mL/hr at 06/18/19 1746  . ceFEPime (MAXIPIME) IV 2 g (06/18/19 1747)  . vancomycin 1,000 mg (06/18/19 1242)   PRN Meds:.acetaminophen **OR** acetaminophen, hydrALAZINE, HYDROcodone-acetaminophen, ipratropium, levalbuterol, ondansetron **OR** ondansetron (ZOFRAN) IV  Current Labs: reviewed    Physical Exam:  Blood pressure (!) 146/61, pulse 65, temperature 98.2 F (36.8 C), temperature source Oral, resp. rate 16, height 5\' 3"  (1.6 m), weight 96.2 kg, SpO2 99 %. NAD lying flat in bed nl wob CTAB RRR No sig LEE AVF +B/T TDC c/d/i  A 1. ESRD, new this admission.  On THS schedule currently but not sure outpt CLIP, pending dispositoion,  Maturing AVF. Next HD 11/17 2. Fever/ inc WBC: NSG and ID following, might need spinal washout 3. ANemia: IV Fe and ESA, CTM, transfuse prn 4. 2HPTH< stable 5. HTN: stable   P . As above   Pearson Grippe MD 06/19/2019, 10:53 AM  Recent Labs  Lab 06/13/19 0458 06/16/19 0712 06/18/19 0541  NA 132* 131* 132*  K 3.5 3.8 4.0  CL 96* 95* 93*   CO2 23 23 23   GLUCOSE 95 95 87  BUN 34* 40* 31*  CREATININE 4.50* 5.82* 4.90*  CALCIUM 8.4* 8.2* 8.5*  PHOS 3.8 4.2  --    Recent Labs  Lab 06/16/19 0711 06/17/19 0502 06/18/19 0541  WBC 16.2* 16.0* 13.2*  HGB 7.3* 8.0* 8.2*  HCT 25.1* 27.2* 27.6*  MCV 87.8 88.9 87.1  PLT 239 239 244

## 2019-06-19 NOTE — Progress Notes (Addendum)
PROGRESS NOTE    [place]   OJJ:[id number]  DOB: 11/07/1940  DOA: 05/30/2019 PCP: McLean-Scocuzza, Nino Glow, MD   Brief Narrative:  [place] is a 78 yo with diabetes mellitus type 2 presently not on medication, chronic diastolic hypertension, chronic kidney disease stage III, anemia, hypothyroidism, hyperlipidemia.   She presented to the ED on 10/26 for dyspnea after a fall out of bed the night before and being on the floor for about 8 hrs. Her son found her in the AM. She was short of breath for at least 2 days.   In ED, noted to be wheezing. Suspected to have acute diastolic CHF and started on Lasix.  Also noted to have AKI with Cr of 5.74 and K of 5.3.  Past Cr 2.64.   Subjective: [place] has no complaints today. She is not very communicative and will not allow an exam. Ovenight she was resistant to receiving treatments as well.     Assessment & Plan:   Principal Problem:  Acute diastolic CHF (congestive heart failure)   Acute on chronic kidney disease stage 4 >> ESRD Metabolic acidosis - nephrology consulted but unfortunately, she did not diurese well and her Cr continued to rise - 11/4 - tunneled dialysis cath placed and underwent dialysis - she has been determined to need permanent dialysis -an outpt dialysis spot has been found- her schedule will be Tu/Th/Sat  Active Problems: Leukocytosis, fever > Sepsis from infected back wound - temp spike 103 on evening of 11/11 - COVID test done for a planned d/c to SNF on 11/11 was negative - blood cultures negative  - UA shows rare bacteria and > 50 wbc with yeast - Urine culture shows > 100 K yeast  - source likely a draining wound on lumbar spine related to lumbar fusion surgery in 2018 which has never healed- infection in the past s/p I and D-  now infected again - NS consulted on 11/12 and ordered a CT L spine to check hardware in spine- Dr Ellene Route feels she need and I and D and removal of hardware - ID consulted  11/12 and assisting with antibiotics- Vanc and Cefepime are being given with dialysis  Possible acute encephalopathy with underlying dementia - she does not have capacity to make medical decisions - son [place] feels she has had memory loss for about 4-5 yrs- she has had delulsions and paranoia for about 2 years - acute confusion may be due to sepsis or hospital stay- overall she has not had any behavioral disturbances but, as noted, began to decline some care yesterday- family aware and will be visiting her today  - today she is refusing medication, exam and vital signs- I have spoken with her son, [place] again today- he states she has done this in the past- He and his brother are trying to talk to her- he will call her sister to come see her tomorrow   Hypothyroidism  - cont Synthroid  DM? - last A1c was 5.2- not on medication at home - sugars have been normal - stopped fingersticks  AOCD - appreciate management per renal   HTN - on Norvasc, Labetalol, Hydralazine  HLD - Lipitor  Anemia of chronic disease - following   Time spent in minutes: 35 min   DVT prophylaxis:Heparin Code Status: Full code Family Communication:  Son [place] Disposition Plan: SNF after back infection addressed Consultants:   Nephrology  Vascular surgery  NS  ID Procedures:  11/4 tunneled cath Antimicrobials:  Anti-infectives (From admission, onward)   Start     Dose/Rate Route Frequency Ordered Stop   06/16/19 1800  ceFEPIme (MAXIPIME) 2 g in sodium chloride 0.9 % 100 mL IVPB     2 g 200 mL/hr over 30 Minutes Intravenous Once per day on Tue Thu Sat 06/15/19 1904     06/16/19 1200  vancomycin (VANCOCIN) IVPB 1000 mg/200 mL premix     1,000 mg 200 mL/hr over 60 Minutes Intravenous Every T-Th-Sa (Hemodialysis) 06/15/19 1904     06/15/19 2030  vancomycin (VANCOCIN) 2,000 mg in sodium chloride 0.9 % 500 mL IVPB     2,000 mg 250 mL/hr over 120 Minutes Intravenous  Once 06/15/19 1902  06/16/19 0010   06/15/19 2000  ceFEPIme (MAXIPIME) 2 g in sodium chloride 0.9 % 100 mL IVPB     2 g 200 mL/hr over 30 Minutes Intravenous  Once 06/15/19 1902 06/15/19 2028   06/08/19 0600  cefUROXime (ZINACEF) 1.5 g in sodium chloride 0.9 % 100 mL IVPB     1.5 g 200 mL/hr over 30 Minutes Intravenous On call to O.R. 06/07/19 1519 06/08/19 1328       Objective: Vitals:   06/18/19 0641 06/18/19 0642 06/18/19 1139 06/18/19 1525  BP:  (!) 151/45 (!) 152/94 (!) 146/61  Pulse:  64 65 65  Resp:  20 16   Temp:  (!) 97.5 F (36.4 C) 98.2 F (36.8 C)   TempSrc:  Oral Oral   SpO2:  100% 100% 99%  Weight: 96.2 kg     Height:        Intake/Output Summary (Last 24 hours) at 06/19/2019 0855 Last data filed at 06/18/2019 1257 Gross per 24 hour  Intake 95.37 ml  Output --  Net 95.37 ml   Filed Weights   06/16/19 1106 06/17/19 0523 06/18/19 0641  Weight: 99.7 kg 98 kg 96.2 kg    Examination: will not allow exam    Data Reviewed: I have personally reviewed following labs and imaging studies  CBC: Recent Labs  Lab 06/13/19 0458 06/15/19 1827 06/16/19 0711 06/17/19 0502 06/18/19 0541  WBC 20.8* 17.4* 16.2* 16.0* 13.2*  HGB 7.7* 7.9* 7.3* 8.0* 8.2*  HCT 26.1* 26.7* 25.1* 27.2* 27.6*  MCV 87.0 87.8 87.8 88.9 87.1  PLT 242 234 239 239 338   Basic Metabolic Panel: Recent Labs  Lab 06/13/19 0458 06/16/19 0712 06/18/19 0541  NA 132* 131* 132*  K 3.5 3.8 4.0  CL 96* 95* 93*  CO2 23 23 23   GLUCOSE 95 95 87  BUN 34* 40* 31*  CREATININE 4.50* 5.82* 4.90*  CALCIUM 8.4* 8.2* 8.5*  PHOS 3.8 4.2  --    GFR: Estimated Creatinine Clearance: 10.8 mL/min (A) (by C-G formula based on SCr of 4.9 mg/dL (H)). Liver Function Tests: Recent Labs  Lab 06/13/19 0458 06/16/19 0712  ALBUMIN 1.7* 1.7*   No results for input(s): LIPASE, AMYLASE in the last 168 hours. No results for input(s): AMMONIA in the last 168 hours. Coagulation Profile: No results for input(s): INR, PROTIME in  the last 168 hours. Cardiac Enzymes: No results for input(s): CKTOTAL, CKMB, CKMBINDEX, TROPONINI in the last 168 hours. BNP (last 3 results) No results for input(s): PROBNP in the last 8760 hours. HbA1C: No results for input(s): HGBA1C in the last 72 hours. CBG: Recent Labs  Lab 06/17/19 1652 06/17/19 2109 06/18/19 0645 06/18/19 1129 06/18/19 1729  GLUCAP 88 73 85 84 83   Lipid  Profile: No results for input(s): CHOL, HDL, LDLCALC, TRIG, CHOLHDL, LDLDIRECT in the last 72 hours. Thyroid Function Tests: No results for input(s): TSH, T4TOTAL, FREET4, T3FREE, THYROIDAB in the last 72 hours. Anemia Panel: No results for input(s): VITAMINB12, FOLATE, FERRITIN, TIBC, IRON, RETICCTPCT in the last 72 hours. Urine analysis:    Component Value Date/Time   COLORURINE AMBER (A) 06/15/2019 1800   APPEARANCEUR CLOUDY (A) 06/15/2019 1800   APPEARANCEUR Cloudy 12/14/2013 1727   LABSPEC 1.026 06/15/2019 1800   LABSPEC 1.012 12/14/2013 1727   PHURINE 5.0 06/15/2019 1800   GLUCOSEU 50 (A) 06/15/2019 1800   GLUCOSEU NEGATIVE 07/21/2017 1001   HGBUR NEGATIVE 06/15/2019 1800   BILIRUBINUR NEGATIVE 06/15/2019 1800   BILIRUBINUR Negative 12/14/2013 1727   KETONESUR NEGATIVE 06/15/2019 1800   PROTEINUR >=300 (A) 06/15/2019 1800   UROBILINOGEN 0.2 07/21/2017 1001   NITRITE NEGATIVE 06/15/2019 1800   LEUKOCYTESUR MODERATE (A) 06/15/2019 1800   LEUKOCYTESUR 3+ 12/14/2013 1727   Sepsis Labs: @LABRCNTIP (procalcitonin:4,lacticidven:4) ) Recent Results (from the past 240 hour(s))  SARS CORONAVIRUS 2 (TAT 6-24 HRS) Nasopharyngeal Nasopharyngeal Swab     Status: None   Collection Time: 06/15/19 11:20 AM   Specimen: Nasopharyngeal Swab  Result Value Ref Range Status   SARS Coronavirus 2 NEGATIVE NEGATIVE Final    Comment: (NOTE) SARS-CoV-2 target nucleic acids are NOT DETECTED. The SARS-CoV-2 RNA is generally detectable in upper and lower respiratory specimens during the acute phase of infection.  Negative results do not preclude SARS-CoV-2 infection, do not rule out co-infections with other pathogens, and should not be used as the sole basis for treatment or other patient management decisions. Negative results must be combined with clinical observations, patient history, and epidemiological information. The expected result is Negative. Fact Sheet for Patients: SugarRoll.be Fact Sheet for Healthcare Providers: https://www.woods-mathews.com/ This test is not yet approved or cleared by the [place] FDA and  has been authorized for detection and/or diagnosis of SARS-CoV-2 by FDA under an Emergency Use Authorization (EUA). This EUA will remain  in effect (meaning this test can be used) for the duration of the COVID-19 declaration under Section 56 4(b)(1) of the Act, 21 U.S.C. section 360bbb-3(b)(1), unless the authorization is terminated or revoked sooner. Performed at [facility], [place] [address] [place], [place] [address]   Culture, blood (routine x 2)     Status: None (Preliminary result)   Collection Time: 06/15/19 12:45 PM   Specimen: BLOOD  Result Value Ref Range Status   Specimen Description BLOOD RIGHT ANTECUBITAL  Final   Special Requests   Final    BOTTLES DRAWN AEROBIC ONLY Blood Culture results may not be optimal due to an inadequate volume of blood received in culture bottles   Culture   Final    NO GROWTH 3 DAYS Performed at [facility], [place] [address] [place], [place] [address]    Report Status PENDING  Incomplete  Culture, blood (routine x 2)     Status: None (Preliminary result)   Collection Time: 06/15/19 12:49 PM   Specimen: BLOOD  Result Value Ref Range Status   Specimen Description BLOOD RIGHT ANTECUBITAL  Final   Special Requests   Final    BOTTLES DRAWN AEROBIC ONLY Blood Culture results may not be optimal due to an inadequate volume of blood received in culture bottles   Culture   Final     NO GROWTH 3 DAYS Performed at [facility], [place] [address] [place], [place]  [address]    Report Status PENDING  Incomplete  Culture, Urine     Status: Abnormal   Collection Time: 06/15/19  6:49 PM   Specimen: Urine, Random  Result Value Ref Range Status   Specimen Description URINE, RANDOM  Final   Special Requests   Final    NONE Performed at [facility], [place] [address], [place], [place] [address]    Culture >=100,000 COLONIES/mL YEAST (A)  Final   Report Status 06/17/2019 FINAL  Final         Radiology Studies: Ct Lumbar Spine Wo Contrast  Result Date: 06/17/2019 CLINICAL DATA:  Follow-up lumbar fusion. EXAM: CT LUMBAR SPINE WITHOUT CONTRAST TECHNIQUE: Multidetector CT imaging of the lumbar spine was performed without intravenous contrast administration. Multiplanar CT image reconstructions were also generated. COMPARISON:  MRI 05/20/2019.  CT 02/24/2017. FINDINGS: Segmentation: 5 lumbar type vertebral bodies as numbered previously. Alignment: Normal Vertebrae: No regional fracture. Previous pedicle screws and posterior rods from L3 through S1. Discectomy and fusion material at L3-4, L4-5 and L5-S1. Anterior plate and screws at F8-H8. Paraspinal and other soft tissues: There is fluid extending as better shown at MRI, posteriorly from the soft tissues posterior to the surgical region extending to the skin surface. This is not well characterized using ordinary CT. Disc levels: T11-12 and T12-L1: Normal. L1-2: Disc degeneration with vacuum phenomenon. Mild bulging of the disc. Mild facet osteoarthritis. No compressive stenosis. L2-3: Disc degeneration with bulging of the disc. Bilateral facet hypertrophy. Stenosis of the lateral recesses and foramina, left more than right. Neural compression could occur at this level, more likely on the left. L3 to sacrum: There appears to be solid fusion across the disc space at L3-4. There is lucency around the L3 screws, which is surprising  given the appearance of solid union. No loosening of the L4 screws. At L4-5, there is no sign of motion. I cannot definitely establish solid bone union across the disc space, but absence of signs of motion suggest union. At L5-S1, there is no screw loosening. There is solid union across the disc space. IMPRESSION: Satisfactory appearance in the fusion segment from L3 to the sacrum. Definite solid union at L3-4 and L5-S1. Lucency around the screws at L3 is difficult to explain given the appearance of solid union across the disc space in the absence of other signs of motion. At L4-5, there is no screw loosening and presumably there is solid union. Definite solid union at L5-S1. Satisfactory patency of the canal and foramina throughout that segment. Adjacent segment degenerative changes at L2-3 with bulging of the disc and facet and ligamentous hypertrophy resulting in narrowing of the lateral recesses and foramina left more than right that could be symptomatic. Electronically Signed   By: Nelson Chimes M.D.   On: 06/17/2019 14:28      Scheduled Meds:  amLODipine  10 mg Oral Daily   atorvastatin  40 mg Oral q1800   Chlorhexidine Gluconate Cloth  6 each Topical Q0600   Chlorhexidine Gluconate Cloth  6 each Topical Q0600   darbepoetin (ARANESP) injection - DIALYSIS  150 mcg Intravenous Q Sat-HD   gabapentin  200 mg Oral QHS   heparin  5,000 Units Subcutaneous Q8H   hydrALAZINE  100 mg Oral Q8H   labetalol  200 mg Oral BID   levothyroxine  100 mcg Oral QAC breakfast   Continuous Infusions:  sodium chloride 10 mL/hr at 06/18/19 1746   ceFEPime (MAXIPIME) IV 2 g (06/18/19 1747)  vancomycin 1,000 mg (06/18/19 1242)     LOS: 20 days      Debbe Odea, MD [facility] Pager: [email or web address] Password [email or web address] 06/19/2019, 8:55 AM

## 2019-06-20 LAB — GLUCOSE, CAPILLARY
Glucose-Capillary: 122 mg/dL — ABNORMAL HIGH (ref 70–99)
Glucose-Capillary: 103 mg/dL — ABNORMAL HIGH (ref 70–99)
Glucose-Capillary: 106 mg/dL — ABNORMAL HIGH (ref 70–99)

## 2019-06-20 LAB — CULTURE, BLOOD (ROUTINE X 2)
Culture: NO GROWTH
Culture: NO GROWTH

## 2019-06-20 MED ORDER — HEPARIN SODIUM (PORCINE) 5000 UNIT/ML IJ SOLN: 5000.0000 [IU] | Freq: Three times a day (TID) | INTRAMUSCULAR | Status: DC

## 2019-06-20 MED ORDER — HEPARIN SODIUM (PORCINE) 5000 UNIT/ML IJ SOLN: 5000.0000 [IU] | Freq: Once | INTRAMUSCULAR | Status: DC

## 2019-06-20 NOTE — Progress Notes (Signed)
Pt refused all her morning medications. Able to get CHG done.

## 2019-06-20 NOTE — Care Management Important Message (Signed)
Important Message  Patient Details  Name: [place] MRN: [id number] Date of Birth: 06-28-1941   Medicare Important Message Given:  Yes     [name] 06/20/2019, 1:10 PM

## 2019-06-20 NOTE — Progress Notes (Signed)
Patient back in bed with assistance.

## 2019-06-20 NOTE — Progress Notes (Signed)
    [facility] for Infectious Disease    Date of Admission:  05/30/2019   Total days of antibiotics 6/ vanco-cefepime           ID: [place] is a 78 y.o. female with lumbar HW wound dehiscence Principal Problem:   Acute CHF (congestive heart failure) (HCC) Active Problems:   Hypothyroidism   DM (diabetes mellitus), type 2 with renal complications (HCC)   Anemia   Acute renal failure superimposed on stage 3 chronic kidney disease (HCC)   Essential hypertension   Pressure injury of skin   Fever    Subjective: Afebrile. Some drainage still noted on bandage. Denies chills/nightsweats/rigors/ abdominal cramping/diarrhea  Medications:  . amLODipine  10 mg Oral Daily  . atorvastatin  40 mg Oral q1800  . Chlorhexidine Gluconate Cloth  6 each Topical Q0600  . Chlorhexidine Gluconate Cloth  6 each Topical Q0600  . darbepoetin (ARANESP) injection - DIALYSIS  150 mcg Intravenous Q Sat-HD  . gabapentin  200 mg Oral QHS  . hydrALAZINE  100 mg Oral Q8H  . labetalol  200 mg Oral BID  . levothyroxine  100 mcg Oral QAC breakfast    Objective: Vital signs in last 24 hours: Temp:  [98.2 F (36.8 C)-98.3 F (36.8 C)] 98.3 F (36.8 C) (11/16 1710) Pulse Rate:  [74-80] 76 (11/16 1710) Resp:  [16] 16 (11/16 1710) BP: (136-161)/(49-141) 160/141 (11/16 1710) SpO2:  [95 %-100 %] 100 % (11/16 1710) Weight:  [95.3 kg] 95.3 kg (11/16 0500) Physical Exam  Constitutional:  oriented to person, place, and time. appears well-developed and well-nourished. No distress.  HENT: [place]/AT, PERRLA, no scleral icterus Mouth/Throat: Oropharynx is clear and moist. No oropharyngeal exudate.  Cardiovascular: Normal rate, regular rhythm and normal heart sounds. Exam reveals no gallop and no friction rub.  No murmur heard.  Pulmonary/Chest: Effort normal and breath sounds normal. No respiratory distress.  has no wheezes.  Chest wall = right hd catheter Abdominal: Soft. Bowel sounds are normal.  exhibits  no distension. There is no tenderness.  Lymphadenopathy: no cervical adenopathy. No axillary adenopathy Neurological: alert and oriented to person, place, and time.  Skin: Skin is warm and dry. No rash noted. No erythema.  Psychiatric: a normal mood and affect.  behavior is normal.    Lab Results Recent Labs    06/18/19 0541  WBC 13.2*  HGB 8.2*  HCT 27.6*  NA 132*  K 4.0  CL 93*  CO2 23  BUN 31*  CREATININE 4.90*    Microbiology: 11/11 blood cx ngtd 11/11 urine cx yeast Studies/Results: No results found.   Assessment/Plan: HW lumbar wound infection = continue on broad specturm abtx, will need wash out and hw removal if possible. Defer to dr Kathyrn Sheriff for timing  Will check sed rate and crp  [facility] Cell: [phone] Pager: [phone]  06/20/2019, 8:04 PM

## 2019-06-20 NOTE — Progress Notes (Signed)
Admit: 05/30/2019 LOS: 21  37F new ESRD, F/inc WBC ? Spinal hardware infected  Subjective:  . Next HD tomorrow.  Reports needing to use the restroom.  No intake/output data recorded.  Filed Weights   06/17/19 0523 06/18/19 0641 06/20/19 0500  Weight: 98 kg 96.2 kg 95.3 kg    Scheduled Meds: . amLODipine  10 mg Oral Daily  . atorvastatin  40 mg Oral q1800  . Chlorhexidine Gluconate Cloth  6 each Topical Q0600  . Chlorhexidine Gluconate Cloth  6 each Topical Q0600  . darbepoetin (ARANESP) injection - DIALYSIS  150 mcg Intravenous Q Sat-HD  . gabapentin  200 mg Oral QHS  . hydrALAZINE  100 mg Oral Q8H  . labetalol  200 mg Oral BID  . levothyroxine  100 mcg Oral QAC breakfast   Continuous Infusions: . sodium chloride 10 mL/hr at 06/18/19 1746  . ceFEPime (MAXIPIME) IV 2 g (06/18/19 1747)  . vancomycin 1,000 mg (06/18/19 1242)   PRN Meds:.acetaminophen **OR** acetaminophen, hydrALAZINE, HYDROcodone-acetaminophen, ipratropium, levalbuterol, ondansetron **OR** ondansetron (ZOFRAN) IV  Current Labs: reviewed    Physical Exam:  Blood pressure (!) 136/53, pulse 74, temperature 98.2 F (36.8 C), temperature source Axillary, resp. rate 16, height 5\' 3"  (1.6 m), weight 95.3 kg, SpO2 95 %. GEN: NAD lying flat in bed nl wob PULM: CTAB CV: RRR EXT; 1+ Le edema ACCESS: AVF +B/ TDC c/d/i  A 1. ESRD, new this admission.  On [facility] schedule currently--> has TTS spot [place],  Maturing AVF. Next HD 11/17 2. Fever/ inc WBC: NSG and ID following, might need spinal washout 3. ANemia: IV Fe and ESA, CTM, transfuse prn 4. 2HPTH< stable 5. HTN: stable     Madelon Lips MD 06/20/2019, 1:01 PM  Recent Labs  Lab 06/16/19 0712 06/18/19 0541  NA 131* 132*  K 3.8 4.0  CL 95* 93*  CO2 23 23  GLUCOSE 95 87  BUN 40* 31*  CREATININE 5.82* 4.90*  CALCIUM 8.2* 8.5*  PHOS 4.2  --    Recent Labs  Lab 06/16/19 0711 06/17/19 0502 06/18/19 0541  WBC 16.2* 16.0* 13.2*  HGB 7.3*  8.0* 8.2*  HCT 25.1* 27.2* 27.6*  MCV 87.8 88.9 87.1  PLT 239 239 244

## 2019-06-20 NOTE — Progress Notes (Signed)
Able to get patient BP and pulse. She didn't allow temp to be collected.

## 2019-06-20 NOTE — Progress Notes (Signed)
PROGRESS NOTE    BRYNDLE CORREDOR   JAS:[id number]  DOB: Feb 12, 1941  DOA: 05/30/2019 PCP: McLean-Scocuzza, Nino Glow, MD   Brief Narrative:  Taylor Tyler is a 78 yo with diabetes mellitus type 2 presently not on medication, chronic diastolic hypertension, chronic kidney disease stage III, anemia, hypothyroidism, hyperlipidemia.   She presented to the ED on 10/26 for dyspnea after a fall out of bed the night before and being on the floor for about 8 hrs. Her son found her in the AM. She was short of breath for at least 2 days.   In ED, noted to be wheezing. Suspected to have acute diastolic CHF and started on Lasix.  Also noted to have AKI with Cr of 5.74 and K of 5.3.  Past Cr 2.64.   Subjective: Taylor Tyler has no complaints today. She continues to refuse many treatment and is not wanting breakfast this AM.    Assessment & Plan:   Principal Problem:  Acute diastolic CHF (congestive heart failure)   Acute on chronic kidney disease stage 4 >> ESRD Metabolic acidosis - nephrology consulted but unfortunately, she did not diurese well and her Cr continued to rise - 11/4 - tunneled dialysis cath placed and underwent dialysis - she has been determined to need permanent dialysis -an outpt dialysis spot has been found- her schedule will be Tu/Th/Sat  Active Problems: Leukocytosis, fever > Sepsis from infected back wound - temp spike 103 on evening of 11/11 - COVID test done for a planned d/c to SNF on 11/11 was negative - blood cultures negative  - UA shows rare bacteria and > 50 wbc with yeast - Urine culture shows > 100 K yeast  - source likely a draining wound on lumbar spine related to lumbar fusion surgery in 2018 which has never healed- infection in the past s/p I and D-  now infected again - NS consulted on 11/12 and ordered a CT L spine to check hardware in spine- Dr Ellene Route feels she need an I and D and removal of hardware- awaiting input from Dr Kathyrn Sheriff - ID consulted 11/12 and  assisting with antibiotics- Vanc and Cefepime are being given with dialysis  Acute encephalopathy with underlying dementia - she does not have capacity to make medical decisions - son Taylor Tyler feels she has had memory loss for about 4-5 yrs- she has had delulsions and paranoia for about 2 years - acute confusion may be due to sepsis or hospital stay- overall she has not had any behavioral disturbances but, as noted, began to decline most care on 11/15 - I have spoken with her son, Taylor Tyler again - he states she has done this in the past- He and his brother are trying to talk to her- he will call her sister to come see her tomorrow- if she continues to decline treatment, I will need to discuss comfort care   Hypothyroidism  - cont Synthroid  DM? - last A1c was 5.2- not on medication at home - sugars have been normal - stopped fingersticks  AOCD - appreciate management per renal   HTN - on Norvasc, Labetalol, Hydralazine which she has been declining- BP moderately elevated  HLD - Lipitor  Anemia of chronic disease - following   Time spent in minutes: 35 min   DVT prophylaxis:Heparin Code Status: Full code Family Communication:  Son Obera Stauch Disposition Plan: SNF   Consultants:   Nephrology  Vascular surgery  NS  ID Procedures:    11/4  tunneled cath Antimicrobials:  Anti-infectives (From admission, onward)   Start     Dose/Rate Route Frequency Ordered Stop   06/16/19 1800  ceFEPIme (MAXIPIME) 2 g in sodium chloride 0.9 % 100 mL IVPB     2 g 200 mL/hr over 30 Minutes Intravenous Once per day on Tue Thu Sat 06/15/19 1904     06/16/19 1200  vancomycin (VANCOCIN) IVPB 1000 mg/200 mL premix     1,000 mg 200 mL/hr over 60 Minutes Intravenous Every T-Th-Sa (Hemodialysis) 06/15/19 1904     06/15/19 2030  vancomycin (VANCOCIN) 2,000 mg in sodium chloride 0.9 % 500 mL IVPB     2,000 mg 250 mL/hr over 120 Minutes Intravenous  Once 06/15/19 1902 06/16/19 0010   06/15/19 2000   ceFEPIme (MAXIPIME) 2 g in sodium chloride 0.9 % 100 mL IVPB     2 g 200 mL/hr over 30 Minutes Intravenous  Once 06/15/19 1902 06/15/19 2028   06/08/19 0600  cefUROXime (ZINACEF) 1.5 g in sodium chloride 0.9 % 100 mL IVPB     1.5 g 200 mL/hr over 30 Minutes Intravenous On call to O.R. 06/07/19 1519 06/08/19 1328       Objective: Vitals:   06/18/19 1525 06/19/19 2233 06/20/19 0500 06/20/19 0800  BP: (!) 146/61 (!) 152/49  (!) 161/59  Pulse: 65 79  80  Resp:    16  Temp:    98.2 F (36.8 C)  TempSrc:    Axillary  SpO2: 99%   99%  Weight:   95.3 kg   Height:        Intake/Output Summary (Last 24 hours) at 06/20/2019 1013 Last data filed at 06/19/2019 1700 Gross per 24 hour  Intake 0 ml  Output -  Net 0 ml   Filed Weights   06/17/19 0523 06/18/19 0641 06/20/19 0500  Weight: 98 kg 96.2 kg 95.3 kg    Examination: will not allow exam    Data Reviewed: I have personally reviewed following labs and imaging studies  CBC: Recent Labs  Lab 06/15/19 1827 06/16/19 0711 06/17/19 0502 06/18/19 0541  WBC 17.4* 16.2* 16.0* 13.2*  HGB 7.9* 7.3* 8.0* 8.2*  HCT 26.7* 25.1* 27.2* 27.6*  MCV 87.8 87.8 88.9 87.1  PLT 234 239 239 449   Basic Metabolic Panel: Recent Labs  Lab 06/16/19 0712 06/18/19 0541  NA 131* 132*  K 3.8 4.0  CL 95* 93*  CO2 23 23  GLUCOSE 95 87  BUN 40* 31*  CREATININE 5.82* 4.90*  CALCIUM 8.2* 8.5*  PHOS 4.2  --    GFR: Estimated Creatinine Clearance: 10.7 mL/min (A) (by C-G formula based on SCr of 4.9 mg/dL (H)). Liver Function Tests: Recent Labs  Lab 06/16/19 0712  ALBUMIN 1.7*   No results for input(s): LIPASE, AMYLASE in the last 168 hours. No results for input(s): AMMONIA in the last 168 hours. Coagulation Profile: No results for input(s): INR, PROTIME in the last 168 hours. Cardiac Enzymes: No results for input(s): CKTOTAL, CKMB, CKMBINDEX, TROPONINI in the last 168 hours. BNP (last 3 results) No results for input(s): PROBNP in  the last 8760 hours. HbA1C: No results for input(s): HGBA1C in the last 72 hours. CBG: Recent Labs  Lab 06/17/19 1652 06/17/19 2109 06/18/19 0645 06/18/19 1129 06/18/19 1729  GLUCAP 88 73 85 84 83   Lipid Profile: No results for input(s): CHOL, HDL, LDLCALC, TRIG, CHOLHDL, LDLDIRECT in the last 72 hours. Thyroid Function Tests: No results for input(s): TSH, T4TOTAL, FREET4,  T3FREE, THYROIDAB in the last 72 hours. Anemia Panel: No results for input(s): VITAMINB12, FOLATE, FERRITIN, TIBC, IRON, RETICCTPCT in the last 72 hours. Urine analysis:    Component Value Date/Time   COLORURINE AMBER (A) 06/15/2019 1800   APPEARANCEUR CLOUDY (A) 06/15/2019 1800   APPEARANCEUR Cloudy 12/14/2013 1727   LABSPEC 1.026 06/15/2019 1800   LABSPEC 1.012 12/14/2013 1727   PHURINE 5.0 06/15/2019 1800   GLUCOSEU 50 (A) 06/15/2019 1800   GLUCOSEU NEGATIVE 07/21/2017 1001   HGBUR NEGATIVE 06/15/2019 1800   BILIRUBINUR NEGATIVE 06/15/2019 1800   BILIRUBINUR Negative 12/14/2013 1727   KETONESUR NEGATIVE 06/15/2019 1800   PROTEINUR >=300 (A) 06/15/2019 1800   UROBILINOGEN 0.2 07/21/2017 1001   NITRITE NEGATIVE 06/15/2019 1800   LEUKOCYTESUR MODERATE (A) 06/15/2019 1800   LEUKOCYTESUR 3+ 12/14/2013 1727   Sepsis Labs: @LABRCNTIP (procalcitonin:4,lacticidven:4) ) Recent Results (from the past 240 hour(s))  SARS CORONAVIRUS 2 (TAT 6-24 HRS) Nasopharyngeal Nasopharyngeal Swab     Status: None   Collection Time: 06/15/19 11:20 AM   Specimen: Nasopharyngeal Swab  Result Value Ref Range Status   SARS Coronavirus 2 NEGATIVE NEGATIVE Final    Comment: (NOTE) SARS-CoV-2 target nucleic acids are NOT DETECTED. The SARS-CoV-2 RNA is generally detectable in upper and lower respiratory specimens during the acute phase of infection. Negative results do not preclude SARS-CoV-2 infection, do not rule out co-infections with other pathogens, and should not be used as the sole basis for treatment or other  patient management decisions. Negative results must be combined with clinical observations, patient history, and epidemiological information. The expected result is Negative. Fact Sheet for Patients: SugarRoll.be Fact Sheet for Healthcare Providers: https://www.woods-mathews.com/ This test is not yet approved or cleared by the [place] FDA and  has been authorized for detection and/or diagnosis of SARS-CoV-2 by FDA under an Emergency Use Authorization (EUA). This EUA will remain  in effect (meaning this test can be used) for the duration of the COVID-19 declaration under Section 56 4(b)(1) of the Act, 21 U.S.C. section 360bbb-3(b)(1), unless the authorization is terminated or revoked sooner. Performed at [facility], [place] [address], [place], [place] [address]   Culture, blood (routine x 2)     Status: None (Preliminary result)   Collection Time: 06/15/19 12:45 PM   Specimen: BLOOD  Result Value Ref Range Status   Specimen Description BLOOD RIGHT ANTECUBITAL  Final   Special Requests   Final    BOTTLES DRAWN AEROBIC ONLY Blood Culture results may not be optimal due to an inadequate volume of blood received in culture bottles   Culture   Final    NO GROWTH 4 DAYS Performed at [facility], [place] [address] [place], [place] [address]    Report Status PENDING  Incomplete  Culture, blood (routine x 2)     Status: None (Preliminary result)   Collection Time: 06/15/19 12:49 PM   Specimen: BLOOD  Result Value Ref Range Status   Specimen Description BLOOD RIGHT ANTECUBITAL  Final   Special Requests   Final    BOTTLES DRAWN AEROBIC ONLY Blood Culture results may not be optimal due to an inadequate volume of blood received in culture bottles   Culture   Final    NO GROWTH 4 DAYS Performed at [facility], [place] [address], [place], [place] [address]    Report Status PENDING  Incomplete  Culture, Urine     Status: Abnormal    Collection Time: 06/15/19  6:49 PM  Specimen: Urine, Random  Result Value Ref Range Status   Specimen Description URINE, RANDOM  Final   Special Requests   Final    NONE Performed at [facility], [address], [place], [place] [address]    Culture >=100,000 COLONIES/mL YEAST (A)  Final   Report Status 06/17/2019 FINAL  Final         Radiology Studies: No results found.    Scheduled Meds: . amLODipine  10 mg Oral Daily  . atorvastatin  40 mg Oral q1800  . Chlorhexidine Gluconate Cloth  6 each Topical Q0600  . Chlorhexidine Gluconate Cloth  6 each Topical Q0600  . darbepoetin (ARANESP) injection - DIALYSIS  150 mcg Intravenous Q Sat-HD  . gabapentin  200 mg Oral QHS  . heparin  5,000 Units Subcutaneous Q8H  . hydrALAZINE  100 mg Oral Q8H  . labetalol  200 mg Oral BID  . levothyroxine  100 mcg Oral QAC breakfast   Continuous Infusions: . sodium chloride 10 mL/hr at 06/18/19 1746  . ceFEPime (MAXIPIME) IV 2 g (06/18/19 1747)  . vancomycin 1,000 mg (06/18/19 1242)     LOS: 21 days      Debbe Odea, MD [facility] Pager: [email or web address] Password [email or web address] 06/20/2019, 10:13 AM

## 2019-06-20 NOTE — Progress Notes (Signed)
Physical Therapy Treatment Patient Details Name: Taylor Tyler MRN: [id number] DOB: 05/27/1941 Today's Date: 06/20/2019    History of Present Illness 78 y.o. female admitted 05/30/19 after fall at home, laying on the floor at least 8 hours until found by son, also with worsening SOB and LE edema. VQ scan with low probability for PE. Worked up for HF. Other PMH includes CKD III, DM2, DJD s/p lumbar sx (2018), arthritis.    PT Comments    Pt limited by fatigue during session. Participation is improved during session, sister present and encouraging patient. Pt continues to require physical assistance during all functional mobility at this time. Pt with limited awareness of deficits compounding her current falls risk. Pt will continue to benefit from acute PT POC to reduce falls risk and caregiver burden.   Follow Up Recommendations  SNF     Equipment Recommendations  None recommended by PT    Recommendations for Other Services       Precautions / Restrictions Precautions Precautions: Fall Restrictions Weight Bearing Restrictions: No    Mobility  Bed Mobility Overal bed mobility: Needs Assistance Bed Mobility: Supine to Sit     Supine to sit: Min assist;HOB elevated        Transfers Overall transfer level: Needs assistance Equipment used: Rolling walker (2 wheeled) Transfers: Sit to/from [facility] Sit to Stand: Mod assist Stand pivot transfers: Min assist       General transfer comment: pt requiring modA for stand pivot transfer without use of RW  Ambulation/Gait Ambulation/Gait assistance: Min assist Gait Distance (Feet): 6 Feet Assistive device: Rolling walker (2 wheeled) Gait Pattern/deviations: Step-to pattern;Shuffle Gait velocity: Decreased Gait velocity interpretation: <1.31 ft/sec, indicative of household ambulator General Gait Details: short shuffling steps, fatigues rapidly requesting to sit   Stairs             Wheelchair  Mobility    Modified Rankin (Stroke Patients Only)       Balance Overall balance assessment: Needs assistance Sitting-balance support: Bilateral upper extremity supported;Feet unsupported Sitting balance-Leahy Scale: Fair Sitting balance - Comments: minG   Standing balance support: Bilateral upper extremity supported Standing balance-Leahy Scale: Poor Standing balance comment: minA for static standing balance with BUE support of RW                            Cognition Arousal/Alertness: Awake/alert Behavior During Therapy: WFL for tasks assessed/performed Overall Cognitive Status: Impaired/Different from baseline Area of Impairment: Attention;Memory;Following commands;Safety/judgement;Awareness;Problem solving                   Current Attention Level: Alternating Memory: Decreased recall of precautions;Decreased short-term memory Following Commands: Follows multi-step commands inconsistently Safety/Judgement: Decreased awareness of safety;Decreased awareness of deficits   Problem Solving: Slow processing;Difficulty sequencing        Exercises      General Comments General comments (skin integrity, edema, etc.): Pt requires frequent cueing to maintain attention to task at hand      Pertinent Vitals/Pain Pain Assessment: No/denies pain    Home Living                      Prior Function            PT Goals (current goals can now be found in the care plan section) Acute Rehab PT Goals Patient Stated Goal: To get home Progress towards PT goals: Progressing toward goals  Frequency    Min 2X/week      PT Plan Current plan remains appropriate    Co-evaluation              AM-PAC PT "6 Clicks" Mobility   Outcome Measure  Help needed turning from your back to your side while in a flat bed without using bedrails?: A Little Help needed moving from lying on your back to sitting on the side of a flat bed without using  bedrails?: A Little Help needed moving to and from a bed to a chair (including a wheelchair)?: A Lot Help needed standing up from a chair using your arms (e.g., wheelchair or bedside chair)?: A Lot Help needed to walk in hospital room?: A Little Help needed climbing 3-5 steps with a railing? : Total 6 Click Score: 14    End of Session Equipment Utilized During Treatment: Gait belt Activity Tolerance: Patient limited by fatigue Patient left: in chair;with call bell/phone within reach;with chair alarm set;with family/visitor present Nurse Communication: Mobility status PT Visit Diagnosis: Other abnormalities of gait and mobility (R26.89)     Time: 6734-1937 PT Time Calculation (min) (ACUTE ONLY): 30 min  Charges:  $Gait Training: 8-22 mins $Therapeutic Activity: 8-22 mins                     Zenaida Niece, PT, DPT Acute Rehabilitation Pager: [phone]    Zenaida Niece 06/20/2019, 12:18 PM

## 2019-06-20 NOTE — Progress Notes (Signed)
  NEUROSURGERY PROGRESS NOTE   Agitation and irritability per nursing notes. Pleasant with me this am Complains of stable back pain No concerns  EXAM:  BP (!) 161/59 (BP Location: Left Arm)   Pulse 80   Temp 98.2 F (36.8 C) (Axillary)   Resp 16   Ht 5\' 3"  (1.6 m)   Wt 95.3 kg Comment: Bed  SpO2 99%   BMI 37.20 kg/m   Awake, alert Speech fluent, appropriate  CN grossly intact  Grossly normal BLE strength Wound appearance stable   IMPRESSION/PLAN 78 y.o. female with chronic lumbar wound infection and nonhealing wound. CT scan demonstrates solid fusion from L3-S1. Appears stable overall. - will proceed with hardware removal tomorrow, wound washout - Discussed risks, benefits and alternatives. States understanding and wishes to proceed. - NPO at midnight - Hold all blood thinning medications

## 2019-06-20 NOTE — Progress Notes (Signed)
Patient continues to be guarded with care and confused, Sister at the bedside and she responds better to sister.

## 2019-06-20 NOTE — Progress Notes (Signed)
Occupational Therapy Treatment Patient Details Name: Taylor Tyler MRN: [id number] DOB: 1941-05-21 Today's Date: 06/20/2019    History of present illness 78 y.o. female admitted 05/30/19 after fall at home, laying on the floor at least 8 hours until found by son, also with worsening SOB and LE edema. VQ scan with low probability for PE. Worked up for HF. Other PMH includes CKD III, DM2, DJD s/p lumbar sx (2018), arthritis.   OT comments  With maximum encouragement, assisted pt to don her gown and sit at EOB. Pt drank 4 oz of apple juice and then returned to supine. Disoriented and confused. Refusing OOB and to eat breakfast. Pt for back sx tomorrow per neurosurgery note.   Follow Up Recommendations  SNF;Supervision/Assistance - 24 hour    Equipment Recommendations  None recommended by OT    Recommendations for Other Services      Precautions / Restrictions Precautions Precautions: Fall       Mobility Bed Mobility   Bed Mobility: Supine to Sit;Sit to Supine     Supine to sit: Mod assist Sit to supine: Supervision   General bed mobility comments: pulled up on therapist's hand to raise trunk, used bed pad to position hips at EOB  Transfers                 General transfer comment: refusing OOB    Balance Overall balance assessment: Needs assistance   Sitting balance-Leahy Scale: Fair                                     ADL either performed or assessed with clinical judgement   ADL Overall ADL's : Needs assistance/impaired Eating/Feeding: Supervision/ safety;Set up;Sitting Eating/Feeding Details (indicate cue type and reason): drank 4 oz apple juice             Upper Body Dressing : Moderate assistance;Bed level Upper Body Dressing Details (indicate cue type and reason): to don gown                         Vision       Perception     Praxis      Cognition Arousal/Alertness: Awake/alert Behavior During Therapy: WFL for  tasks assessed/performed Overall Cognitive Status: Impaired/Different from baseline Area of Impairment: Orientation;Attention;Memory;Following commands;Safety/judgement;Awareness;Problem solving                 Orientation Level: Disoriented to;Place;Time;Situation Current Attention Level: Focused Memory: Decreased short-term memory Following Commands: Follows one step commands inconsistently;Follows one step commands with increased time Safety/Judgement: Decreased awareness of safety;Decreased awareness of deficits Awareness: Intellectual Problem Solving: Slow processing;Requires verbal cues General Comments: Confused, had doffed her gown, would not agree to eating breakfast.        Exercises     Shoulder Instructions       General Comments      Pertinent Vitals/ Pain       Pain Assessment: Faces Faces Pain Scale: No hurt  Home Living                                          Prior Functioning/Environment              Frequency  Min 2X/week        Progress Toward  Goals  OT Goals(current goals can now be found in the care plan section)  Progress towards OT goals: Not progressing toward goals - comment(pt confused)  Acute Rehab OT Goals Patient Stated Goal: to watch tv Time For Goal Achievement: 06/27/19 Potential to Achieve Goals: [place] Discharge plan remains appropriate    Co-evaluation                 AM-PAC OT "6 Clicks" Daily Activity     Outcome Measure   Help from another person eating meals?: A Little Help from another person taking care of personal grooming?: A Lot Help from another person toileting, which includes using toliet, bedpan, or urinal?: A Lot Help from another person bathing (including washing, rinsing, drying)?: A Lot Help from another person to put on and taking off regular upper body clothing?: A Lot Help from another person to put on and taking off regular lower body clothing?: A Lot 6 Click  Score: 13    End of Session    OT Visit Diagnosis: Unsteadiness on feet (R26.81);Other symptoms and signs involving cognitive function;Other abnormalities of gait and mobility (R26.89);Muscle weakness (generalized) (M62.81)   Activity Tolerance Other (comment)(limited by confusion)   Patient Left in bed;with call bell/phone within reach;with bed alarm set   Nurse Communication          Time: 1324-4010 OT Time Calculation (min): 18 min  Charges: OT General Charges $OT Visit: 1 Visit OT Treatments $Self Care/Home Management : 8-22 mins  Nestor Lewandowsky, OTR/L Acute Rehabilitation Services Pager: [phone] Office: [phone]   [name] 06/20/2019, 8:45 AM

## 2019-06-21 ENCOUNTER — Encounter (HOSPITAL_COMMUNITY): Payer: Self-pay | Admitting: Certified Registered Nurse Anesthetist

## 2019-06-21 ENCOUNTER — Encounter (HOSPITAL_COMMUNITY): Payer: Self-pay | Admitting: Orthopedic Surgery

## 2019-06-21 LAB — GLUCOSE, CAPILLARY
Glucose-Capillary: 96 mg/dL (ref 70–99)
Glucose-Capillary: 110 mg/dL — ABNORMAL HIGH (ref 70–99)
Glucose-Capillary: 85 mg/dL (ref 70–99)
Glucose-Capillary: 102 mg/dL — ABNORMAL HIGH (ref 70–99)
Glucose-Capillary: 123 mg/dL — ABNORMAL HIGH (ref 70–99)

## 2019-06-21 LAB — RENAL FUNCTION PANEL
Albumin: 1.7 g/dL — ABNORMAL LOW (ref 3.5–5.0)
Anion gap: 13 (ref 5–15)
BUN: 44 mg/dL — ABNORMAL HIGH (ref 8–23)
CO2: 23 mmol/L (ref 22–32)
Calcium: 8.2 mg/dL — ABNORMAL LOW (ref 8.9–10.3)
Chloride: 96 mmol/L — ABNORMAL LOW (ref 98–111)
Creatinine, Ser: 6.12 mg/dL — ABNORMAL HIGH (ref 0.44–1.00)
GFR calc Af Amer: 7 mL/min — ABNORMAL LOW (ref >60)
GFR calc non Af Amer: 6 mL/min — ABNORMAL LOW (ref >60)
Glucose, Bld: 103 mg/dL — ABNORMAL HIGH (ref 70–99)
Phosphorus: 3.8 mg/dL (ref 2.5–4.6)
Potassium: 3.6 mmol/L (ref 3.5–5.1)
Sodium: 132 mmol/L — ABNORMAL LOW (ref 135–145)

## 2019-06-21 LAB — CBC
HCT: 26 % — ABNORMAL LOW (ref 36.0–46.0)
Hemoglobin: 7.6 g/dL — ABNORMAL LOW (ref 12.0–15.0)
MCH: 25.8 pg — ABNORMAL LOW (ref 26.0–34.0)
MCHC: 29.2 g/dL — ABNORMAL LOW (ref 30.0–36.0)
MCV: 88.1 fL (ref 80.0–100.0)
Platelets: 239 10*3/uL (ref 150–400)
RBC: 2.95 MIL/uL — ABNORMAL LOW (ref 3.87–5.11)
RDW: 16.6 % — ABNORMAL HIGH (ref 11.5–15.5)
WBC: 14.7 10*3/uL — ABNORMAL HIGH (ref 4.0–10.5)
nRBC: 0.5 % — ABNORMAL HIGH (ref 0.0–0.2)

## 2019-06-21 MED ORDER — FENTANYL CITRATE (PF) 250 MCG/5ML IJ SOLN
INTRAMUSCULAR | Status: AC
  Filled 2019-06-21: qty 5

## 2019-06-21 MED ORDER — ROCURONIUM BROMIDE 10 MG/ML (PF) SYRINGE
PREFILLED_SYRINGE | INTRAVENOUS | Status: AC
  Filled 2019-06-21: qty 10

## 2019-06-21 MED ORDER — PROPOFOL 10 MG/ML IV BOLUS
INTRAVENOUS | Status: AC
  Filled 2019-06-21: qty 20

## 2019-06-21 MED ORDER — BISACODYL 5 MG PO TBEC
10.0000 mg | DELAYED_RELEASE_TABLET | Freq: Every day | ORAL | Status: DC | PRN
  Administered 2019-06-22: 10 mg via ORAL
  Filled 2019-06-21: qty 2

## 2019-06-21 MED ORDER — DEXAMETHASONE SODIUM PHOSPHATE 10 MG/ML IJ SOLN
INTRAMUSCULAR | Status: AC
  Filled 2019-06-21: qty 1

## 2019-06-21 MED ORDER — THROMBIN 5000 UNITS EX SOLR
CUTANEOUS | Status: AC
  Filled 2019-06-21: qty 5000

## 2019-06-21 MED ORDER — HEPARIN SODIUM (PORCINE) 1000 UNIT/ML DIALYSIS
20.0000 [IU]/kg | INTRAMUSCULAR | Status: DC | PRN
  Filled 2019-06-21: qty 2

## 2019-06-21 MED ORDER — LIDOCAINE-EPINEPHRINE 1 %-1:100000 IJ SOLN
INTRAMUSCULAR | Status: AC
  Filled 2019-06-21: qty 1

## 2019-06-21 MED ORDER — LIDOCAINE 2% (20 MG/ML) 5 ML SYRINGE
INTRAMUSCULAR | Status: AC
  Filled 2019-06-21: qty 5

## 2019-06-21 MED ORDER — HEPARIN SODIUM (PORCINE) 1000 UNIT/ML IJ SOLN
INTRAMUSCULAR | Status: AC
  Administered 2019-06-22: 02:00:00
  Filled 2019-06-21: qty 4

## 2019-06-21 MED ORDER — BUPIVACAINE HCL (PF) 0.5 % IJ SOLN
INTRAMUSCULAR | Status: AC
  Filled 2019-06-21: qty 30

## 2019-06-21 MED ORDER — POLYETHYLENE GLYCOL 3350 17 G PO PACK
17.0000 g | PACK | Freq: Two times a day (BID) | ORAL | Status: DC | PRN
  Administered 2019-06-22: 17 g via ORAL
  Filled 2019-06-21: qty 1

## 2019-06-21 MED ORDER — ONDANSETRON HCL 4 MG/2ML IJ SOLN
INTRAMUSCULAR | Status: AC
  Filled 2019-06-21: qty 2

## 2019-06-21 NOTE — Progress Notes (Signed)
Pharmacy Antibiotic Note  Taylor Tyler is a 78 y.o. female admitted on 05/30/2019 due to a fall at home and shortness of breath. 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  WBC trending down at 13, afebrile overnight.   Currently receiving HD on TTS, HD session planned for today. Continuing broad spectrum abx for now per ID while awaiting intra-op cultures from surgery and hardware removal today.   Plan: Cefepime 2g IV qHD TTS Vancomycin 1g IV qHD TTS If Vancomycin to continue will check trough level with dialysis 11/19  Height: 5\' 3"  (160 cm) Weight: 209 lb (94.8 kg) IBW/kg (Calculated) : 52.4  Temp (24hrs), Avg:98.1 F (36.7 C), Min:97.9 F (36.6 C), Max:98.3 F (36.8 C)  Recent Labs  Lab 06/15/19 1827 06/16/19 0711 06/16/19 0712 06/17/19 0502 06/18/19 0541  WBC 17.4* 16.2*  --  16.0* 13.2*  CREATININE  --   --  5.82*  --  4.90*  LATICACIDVEN 0.8  --   --   --   --     Estimated Creatinine Clearance: 10.7 mL/min (A) (by C-G formula based on SCr of 4.9 mg/dL (H)).    Allergies  Allergen Reactions  . Clams [Shellfish Allergy] Swelling and Other (See Comments)    THROAT SWELLS NECK TURNS RED  . Hydralazine Other (See Comments)    CHEST TIGHTNESS Patient has tolerated multiple doses of hydralazine since allergy was listed   . Norvasc [Amlodipine Besylate] Swelling    Leg edema   . Milk-Related Compounds Diarrhea    11/11 Blood Cx x2: ng 11/11 COVID: neg 11/4 surgical PCR: MRSA PCR negative, SA PCR negative  Erin Hearing PharmD., BCPS Clinical Pharmacist 06/21/2019 10:41 AM

## 2019-06-21 NOTE — Progress Notes (Signed)
  NEUROSURGERY PROGRESS NOTE   No issues overnight. Pt seen in preop area, has eaten some tuna around 11a. No complaints.  EXAM:  BP (!) 142/50 (BP Location: Right Arm)   Pulse 63   Temp 98.3 F (36.8 C) (Oral)   Resp 16   Ht 5' 2.99" (1.6 m)   Wt 94.8 kg   SpO2 95%   BMI 37.03 kg/m   Awake, alert, oriented  Speech fluent CN grossly intact  Moving all extremities well  IMPRESSION:  78 y.o. female with chronic lumbar wound infection/dehiscense. Implanted hardware may be the source of persistent infection. CT has demonstrated clear fusion at L3-4 and L5-S1 with likely fusion at L4-5.   PLAN: - Unable to proceed today as planned due to PO intake. Case rescheduled to Thursday.  I have reviewed the indication for surgery as well as the details of the procedure with the patient and her family. Risks of the procedure were discussed. All their questions were answered. They understand surgery will need to be rescheduled.

## 2019-06-21 NOTE — Progress Notes (Signed)
Patient's case has been moved to Thursday. Gretta Cool, patient's RN has been notified. Awaiting transport back to patient's room.

## 2019-06-21 NOTE — Progress Notes (Signed)
Rec'd report from [facility], patient's floor nurse. Patient had a "teaspoon of tuna" around 1100. Called Dr. Sabra Heck and made him aware. Asked me to called Dr. Kathyrn Sheriff to see if he wanted to deem this an emergency case. Called Nundkumar's cell phone and was told they couldn't address the issue right now. Patient is having moments of confusion and sister is at the bedside with her here in [place]. Will continue to monitor while waiting for a decision.

## 2019-06-21 NOTE — Progress Notes (Addendum)
PROGRESS NOTE    Taylor Tyler   XAJ:[id number]  DOB: Oct 15, 1940  DOA: 05/30/2019 PCP: McLean-Scocuzza, Nino Glow, MD   Brief Narrative:  Taylor Tyler is a 78 yo with diabetes mellitus type 2 presently not on medication, chronic diastolic hypertension, chronic kidney disease stage III, anemia, hypothyroidism, hyperlipidemia.   She presented to the ED on 10/26 for dyspnea after a fall out of bed the night before and being on the floor for about 8 hrs. Her son found her in the AM. She was short of breath for at least 2 days.   In ED, noted to be wheezing. Suspected to have acute diastolic CHF and started on Lasix.  Also noted to have AKI with Cr of 5.74 and K of 5.3.  Past Cr 2.64.  As it was felt that her renal failure was permanent, she was eventually started on dialysis. It was planned to d/c her to SNF however, course complicated by leukocytosis and fevers on 11/11. See below  She was transferred to my care on 11/11.  Subjective: [name] has no complaints today. No new changes per RN. The patient had a good night.    Assessment & Plan:   Principal Problem:  Acute diastolic CHF (congestive heart failure)   Acute on chronic kidney disease stage 4 >> ESRD Metabolic acidosis - nephrology consulted but unfortunately, she did not diurese well and her Cr continued to rise - 11/4 - tunneled dialysis cath placed and underwent dialysis - she has been determined to need permanent dialysis -an outpt dialysis spot has been found- her schedule will be Tu/Th/Sat  Active Problems: Leukocytosis, fever > Sepsis from infected back wound - temp spike 103 on evening of 11/11 - COVID test done for a planned d/c to SNF on 11/11 was negative (prior to temp spike) - blood cultures negative  - UA shows rare bacteria and > 50 wbc with yeast - Urine culture shows > 100 K yeast (Not UTI per ID)  - source likely a very small draining wound on lumbar spine related to lumbar fusion surgery in 2018 which  has never healed- she has had an infection in the past s/p I and D and wound vac 11/19-  now infected again- per RN, it was not draining much when she first arrived to the hospital but on my eval she had profuse dark foul smelling drainage - NS consulted on 11/12 and ordered a CT L spine to check hardware in spine-   - ID consulted 11/12 and assisting with antibiotics- Vanc and Cefepime are being given with dialysis - NS plans to take patient to OR today for hardware removal - ID requesting NS to obtain intra-op cultures- I have passed on the message to NS  Acute encephalopathy with underlying dementia - she does not have capacity to make medical decisions - son [name] feels she has had memory loss for about 4-5 yrs- she has had delulsions and paranoia for about 2 years - acute confusion may be due to sepsis or hospital stay- overall she has not had any behavioral disturbances but, began to decline most care on 11/15 - I have spoken with her son, [name] again - he states she has done this in the past- He and his brother are trying to talk to her- he will call her sister to come see her tomorrow- if she continues to decline treatment, I will need to discuss comfort care - 11/16> began to allow care again  Hypothyroidism  - cont Synthroid  DM? - last A1c was 5.2- not on medication at home - sugars have been normal - stopped fingersticks  AOCD - appreciate management per renal   HTN - on Norvasc, Labetalol, Hydralazine    HLD - Lipitor  Anemia of chronic disease - following  Decubitus ulcers Pressure Injury 06/06/19 Sacrum Left Stage I -  Intact skin with non-blanchable redness of a localized area usually over a bony prominence. (Active)  06/06/19 1010  Location: Sacrum  Location Orientation: Left  Staging: Stage I -  Intact skin with non-blanchable redness of a localized area usually over a bony prominence.  Wound Description (Comments):   Present on Admission: No     Pressure  Injury 06/12/19 Sacrum Left Stage II -  Partial thickness loss of dermis presenting as a shallow open ulcer with a red, pink wound bed without slough. Pink (Active)  06/12/19 2115  Location: Sacrum  Location Orientation: Left  Staging: Stage II -  Partial thickness loss of dermis presenting as a shallow open ulcer with a red, pink wound bed without slough.  Wound Description (Comments): Pink  Present on Admission:      Pressure Injury 06/15/19 Buttocks Left Stage II -  Partial thickness loss of dermis presenting as a shallow open ulcer with a red, pink wound bed without slough. 100 % pinkk (Active)  06/15/19 1102  Location: Buttocks  Location Orientation: Left  Staging: Stage II -  Partial thickness loss of dermis presenting as a shallow open ulcer with a red, pink wound bed without slough.  Wound Description (Comments): 100 % pinkk  Present on Admission: No         Time spent in minutes: 35 min   DVT prophylaxis:Heparin Code Status: Full code Family Communication:  Have been updating son, Kseniya Grunden Disposition Plan: SNF eventually Consultants:   Nephrology  Vascular surgery  NS  ID Procedures:    11/4 tunneled cath Antimicrobials:  Anti-infectives (From admission, onward)   Start     Dose/Rate Route Frequency Ordered Stop   06/16/19 1800  ceFEPIme (MAXIPIME) 2 g in sodium chloride 0.9 % 100 mL IVPB     2 g 200 mL/hr over 30 Minutes Intravenous Once per day on Tue Thu Sat 06/15/19 1904     06/16/19 1200  vancomycin (VANCOCIN) IVPB 1000 mg/200 mL premix     1,000 mg 200 mL/hr over 60 Minutes Intravenous Every T-Th-Sa (Hemodialysis) 06/15/19 1904     06/15/19 2030  vancomycin (VANCOCIN) 2,000 mg in sodium chloride 0.9 % 500 mL IVPB     2,000 mg 250 mL/hr over 120 Minutes Intravenous  Once 06/15/19 1902 06/16/19 0010   06/15/19 2000  ceFEPIme (MAXIPIME) 2 g in sodium chloride 0.9 % 100 mL IVPB     2 g 200 mL/hr over 30 Minutes Intravenous  Once 06/15/19 1902  06/15/19 2028   06/08/19 0600  cefUROXime (ZINACEF) 1.5 g in sodium chloride 0.9 % 100 mL IVPB     1.5 g 200 mL/hr over 30 Minutes Intravenous On call to O.R. 06/07/19 1519 06/08/19 1328       Objective: Vitals:   06/20/19 1710 06/20/19 2015 06/21/19 0244 06/21/19 0453  BP: (!) 160/141 (!) 149/54  (!) 134/97  Pulse: 76 80  63  Resp: 16 18  18   Temp: 98.3 F (36.8 C) 97.9 F (36.6 C)  98 F (36.7 C)  TempSrc: Oral Oral  Oral  SpO2: 100% 97%  98%  Weight:   94.8 kg   Height:        Intake/Output Summary (Last 24 hours) at 06/21/2019 1000 Last data filed at 06/21/2019 0856 Gross per 24 hour  Intake 637.94 ml  Output -  Net 637.94 ml   Filed Weights   06/18/19 0641 06/20/19 0500 06/21/19 0244  Weight: 96.2 kg 95.3 kg 94.8 kg    Examination:  General exam: Appears comfortable  HEENT: PERRLA, oral mucosa moist, no sclera icterus or thrush Respiratory system: Clear to auscultation. Respiratory effort normal. Cardiovascular system: S1 & S2 heard,  No murmurs  Gastrointestinal system: Abdomen soft, non-tender, nondistended. Normal bowel sounds   Central nervous system: Alert and oriented to person and place today but not time or situation. No focal neurological deficits. Extremities: No cyanosis, clubbing or edema Skin: wounds on back not evaluated today Psychiatry:  flat affect    Data Reviewed: I have personally reviewed following labs and imaging studies  CBC: Recent Labs  Lab 06/15/19 1827 06/16/19 0711 06/17/19 0502 06/18/19 0541  WBC 17.4* 16.2* 16.0* 13.2*  HGB 7.9* 7.3* 8.0* 8.2*  HCT 26.7* 25.1* 27.2* 27.6*  MCV 87.8 87.8 88.9 87.1  PLT 234 239 239 195   Basic Metabolic Panel: Recent Labs  Lab 06/16/19 0712 06/18/19 0541  NA 131* 132*  K 3.8 4.0  CL 95* 93*  CO2 23 23  GLUCOSE 95 87  BUN 40* 31*  CREATININE 5.82* 4.90*  CALCIUM 8.2* 8.5*  PHOS 4.2  --    GFR: Estimated Creatinine Clearance: 10.7 mL/min (A) (by C-G formula based on SCr of  4.9 mg/dL (H)). Liver Function Tests: Recent Labs  Lab 06/16/19 0712  ALBUMIN 1.7*   No results for input(s): LIPASE, AMYLASE in the last 168 hours. No results for input(s): AMMONIA in the last 168 hours. Coagulation Profile: No results for input(s): INR, PROTIME in the last 168 hours. Cardiac Enzymes: No results for input(s): CKTOTAL, CKMB, CKMBINDEX, TROPONINI in the last 168 hours. BNP (last 3 results) No results for input(s): PROBNP in the last 8760 hours. HbA1C: No results for input(s): HGBA1C in the last 72 hours. CBG: Recent Labs  Lab 06/18/19 1729 06/20/19 1151 06/20/19 1701 06/20/19 2112 06/21/19 0555  GLUCAP 83 122* 103* 106* 110*   Lipid Profile: No results for input(s): CHOL, HDL, LDLCALC, TRIG, CHOLHDL, LDLDIRECT in the last 72 hours. Thyroid Function Tests: No results for input(s): TSH, T4TOTAL, FREET4, T3FREE, THYROIDAB in the last 72 hours. Anemia Panel: No results for input(s): VITAMINB12, FOLATE, FERRITIN, TIBC, IRON, RETICCTPCT in the last 72 hours. Urine analysis:    Component Value Date/Time   COLORURINE AMBER (A) 06/15/2019 1800   APPEARANCEUR CLOUDY (A) 06/15/2019 1800   APPEARANCEUR Cloudy 12/14/2013 1727   LABSPEC 1.026 06/15/2019 1800   LABSPEC 1.012 12/14/2013 1727   PHURINE 5.0 06/15/2019 1800   GLUCOSEU 50 (A) 06/15/2019 1800   GLUCOSEU NEGATIVE 07/21/2017 1001   HGBUR NEGATIVE 06/15/2019 1800   BILIRUBINUR NEGATIVE 06/15/2019 1800   BILIRUBINUR Negative 12/14/2013 1727   KETONESUR NEGATIVE 06/15/2019 1800   PROTEINUR >=300 (A) 06/15/2019 1800   UROBILINOGEN 0.2 07/21/2017 1001   NITRITE NEGATIVE 06/15/2019 1800   LEUKOCYTESUR MODERATE (A) 06/15/2019 1800   LEUKOCYTESUR 3+ 12/14/2013 1727   Sepsis Labs: @LABRCNTIP (procalcitonin:4,lacticidven:4) ) Recent Results (from the past 240 hour(s))  SARS CORONAVIRUS 2 (TAT 6-24 HRS) Nasopharyngeal Nasopharyngeal Swab     Status: None   Collection Time: 06/15/19 11:20 AM   Specimen:  Nasopharyngeal Swab  Result Value Ref Range Status   SARS Coronavirus 2 NEGATIVE NEGATIVE Final    Comment: (NOTE) SARS-CoV-2 target nucleic acids are NOT DETECTED. The SARS-CoV-2 RNA is generally detectable in upper and lower respiratory specimens during the acute phase of infection. Negative results do not preclude SARS-CoV-2 infection, do not rule out co-infections with other pathogens, and should not be used as the sole basis for treatment or other patient management decisions. Negative results must be combined with clinical observations, patient history, and epidemiological information. The expected result is Negative. Fact Sheet for Patients: SugarRoll.be Fact Sheet for Healthcare Providers: https://www.woods-mathews.com/ This test is not yet approved or cleared by the [place] FDA and  has been authorized for detection and/or diagnosis of SARS-CoV-2 by FDA under an Emergency Use Authorization (EUA). This EUA will remain  in effect (meaning this test can be used) for the duration of the COVID-19 declaration under Section 56 4(b)(1) of the Act, 21 U.S.C. section 360bbb-3(b)(1), unless the authorization is terminated or revoked sooner. Performed at [facility], [place] [address], [place], [place] [address]   Culture, blood (routine x 2)     Status: None   Collection Time: 06/15/19 12:45 PM   Specimen: BLOOD  Result Value Ref Range Status   Specimen Description BLOOD RIGHT ANTECUBITAL  Final   Special Requests   Final    BOTTLES DRAWN AEROBIC ONLY Blood Culture results may not be optimal due to an inadequate volume of blood received in culture bottles   Culture   Final    NO GROWTH 5 DAYS Performed at [facility], [place] [address], [place], [place] [address]    Report Status 06/20/2019 FINAL  Final  Culture, blood (routine x 2)     Status: None   Collection Time: 06/15/19 12:49 PM   Specimen: BLOOD  Result Value Ref  Range Status   Specimen Description BLOOD RIGHT ANTECUBITAL  Final   Special Requests   Final    BOTTLES DRAWN AEROBIC ONLY Blood Culture results may not be optimal due to an inadequate volume of blood received in culture bottles   Culture   Final    NO GROWTH 5 DAYS Performed at [facility], [place] [address], [place], [place] [address]    Report Status 06/20/2019 FINAL  Final  Culture, Urine     Status: Abnormal   Collection Time: 06/15/19  6:49 PM   Specimen: Urine, Random  Result Value Ref Range Status   Specimen Description URINE, RANDOM  Final   Special Requests   Final    NONE Performed at [facility], [place] [address], [place], [place] [address]    Culture >=100,000 COLONIES/mL YEAST (A)  Final   Report Status 06/17/2019 FINAL  Final         Radiology Studies: No results found.    Scheduled Meds: . amLODipine  10 mg Oral Daily  . atorvastatin  40 mg Oral q1800  . Chlorhexidine Gluconate Cloth  6 each Topical Q0600  . Chlorhexidine Gluconate Cloth  6 each Topical Q0600  . darbepoetin (ARANESP) injection - DIALYSIS  150 mcg Intravenous Q Sat-HD  . gabapentin  200 mg Oral QHS  . hydrALAZINE  100 mg Oral Q8H  . labetalol  200 mg Oral BID  . levothyroxine  100 mcg Oral QAC breakfast   Continuous Infusions: . sodium chloride 10 mL/hr at 06/18/19 1746  . ceFEPime (MAXIPIME) IV 2 g (06/18/19 1747)  . vancomycin  1,000 mg (06/18/19 1242)     LOS: 22 days      Debbe Odea, MD [facility] Pager: [email or web address] Password [email or web address] 06/21/2019, 10:00 AM

## 2019-06-21 NOTE — Plan of Care (Signed)
  Problem: Activity: Goal: Risk for activity intolerance will decrease Outcome: Progressing   Problem: Safety: Goal: Ability to remain free from injury will improve Outcome: Progressing   

## 2019-06-21 NOTE — Progress Notes (Signed)
    Taylor Tyler for Infectious Disease    Date of Admission:  05/30/2019   Total days of antibiotics held           ID: Taylor Tyler is a 78 y.o. female with chronic lumbar HW infection Principal Problem:   Acute CHF (congestive heart failure) ([place]) Active Problems:   Hypothyroidism   DM (diabetes mellitus), type 2 with renal complications (HCC)   Anemia   Acute renal failure superimposed on stage 3 chronic kidney disease (HCC)   Essential hypertension   Pressure injury of skin   Fever    Subjective: Npo for upcoming surgery with HW removal. Denies fever, chills, nightsweats.she was unaware that she was receiving surgery today  Medications:  . amLODipine  10 mg Oral Daily  . atorvastatin  40 mg Oral q1800  . Chlorhexidine Gluconate Cloth  6 each Topical Q0600  . Chlorhexidine Gluconate Cloth  6 each Topical Q0600  . darbepoetin (ARANESP) injection - DIALYSIS  150 mcg Intravenous Q Sat-HD  . gabapentin  200 mg Oral QHS  . heparin      . hydrALAZINE  100 mg Oral Q8H  . labetalol  200 mg Oral BID  . levothyroxine  100 mcg Oral QAC breakfast    Objective: Vital signs in last 24 hours: Temp:  [98 F (36.7 C)-98.8 F (37.1 C)] 98.1 F (36.7 C) (11/17 2130) Pulse Rate:  [63-88] 68 (11/17 2130) Resp:  [16-20] 17 (11/17 2130) BP: (105-156)/(47-97) 117/57 (11/17 2130) SpO2:  [95 %-100 %] 98 % (11/17 2130) Weight:  [92.9 kg-94.8 kg] 92.9 kg (11/17 2130) Physical Exam  Constitutional:  oriented to person, only. appears well-developed and well-nourished. No distress.  HENT: [place]/AT, PERRLA, no scleral icterus Mouth/Throat: Oropharynx is clear and moist. No oropharyngeal exudate.  Cardiovascular: Normal rate, regular rhythm and normal heart sounds. Exam reveals no gallop and no friction rub.  No murmur heard.  Pulmonary/Chest: Effort normal and breath sounds normal. No respiratory distress.  has no wheezes.  Neck = supple, no nuchal rigidity Abdominal: Soft. Bowel sounds  are normal.  exhibits no distension. There is no tenderness.  Lymphadenopathy: no cervical adenopathy. No axillary adenopathy Neurological: alert and oriented to person, place, and time.  Skin: Skin is warm and dry. No rash noted. No erythema.    Lab Results Recent Labs    06/21/19 2056  WBC 14.7*  HGB 7.6*  HCT 26.0*  NA 132*  K 3.6  CL 96*  CO2 23  BUN 44*  CREATININE 6.12*   Liver Panel Recent Labs    06/21/19 2056  ALBUMIN 1.7*    Microbiology: reviewed Studies/Results: No results found.   Assessment/Plan: Lumbar HW infection = please send aerobic cultures from OR. abtx held yesterday.clinically stable. Will likely need extended course of 6-8 wk of IV abtx. Hopefully can isolate pathogen so that can direct therapy.  [facility] Cell: [phone] Pager: [phone]  06/21/2019, 10:10 PM

## 2019-06-21 NOTE — Progress Notes (Signed)
  [facility] Progress Note    Assessment/ Plan:   1. ESRD, new this admission.  On THS schedule currently--> has TTS spot [place],  Maturing AVF. Next HD 11/17 (today) 2. Fever/ inc WBC: NSG and ID following, will need spinal washout 3. Anemia: IV Fe and ESA, CTM, transfuse prn 4. 2HPTH< stable 5. HTN: stable  6. Dispo: pending spinal washout and HW removal  Subjective:    Seen in room.  Some asterixis.  For HD today.     Objective:   BP (!) 134/97 (BP Location: Right Arm)   Pulse 63   Temp 98 F (36.7 C) (Oral)   Resp 18   Ht 5\' 3"  (1.6 m)   Wt 94.8 kg   SpO2 98%   BMI 37.02 kg/m   Intake/Output Summary (Last 24 hours) at 06/21/2019 1121 Last data filed at 06/21/2019 0856 Gross per 24 hour  Intake 637.94 ml  Output -  Net 637.94 ml   Weight change: -0.454 kg  Physical Exam: Gen: NAD CVS:RRR no m/r/g Resp: clear bilaterally no c/w/r Abd: soft Ext: trace LE edema NEURO: a little cognitive slowing, mild asterixis ACCESS: R IJ TDC clean/dry/ intact, LUE AVF + T/B Imaging: No results found.  Labs: BMET Recent Labs  Lab 06/16/19 0712 06/18/19 0541  NA 131* 132*  K 3.8 4.0  CL 95* 93*  CO2 23 23  GLUCOSE 95 87  BUN 40* 31*  CREATININE 5.82* 4.90*  CALCIUM 8.2* 8.5*  PHOS 4.2  --    CBC Recent Labs  Lab 06/15/19 1827 06/16/19 0711 06/17/19 0502 06/18/19 0541  WBC 17.4* 16.2* 16.0* 13.2*  HGB 7.9* 7.3* 8.0* 8.2*  HCT 26.7* 25.1* 27.2* 27.6*  MCV 87.8 87.8 88.9 87.1  PLT 234 239 239 244    Medications:    . amLODipine  10 mg Oral Daily  . atorvastatin  40 mg Oral q1800  . Chlorhexidine Gluconate Cloth  6 each Topical Q0600  . Chlorhexidine Gluconate Cloth  6 each Topical Q0600  . darbepoetin (ARANESP) injection - DIALYSIS  150 mcg Intravenous Q Sat-HD  . gabapentin  200 mg Oral QHS  . hydrALAZINE  100 mg Oral Q8H  . labetalol  200 mg Oral BID  . levothyroxine  100 mcg Oral QAC breakfast      Madelon Lips,  MD 06/21/2019, 11:21 AM

## 2019-06-21 NOTE — TOC Progression Note (Signed)
Transition of Care [facility]) - Progression Note    Patient Details  Name: AVILENE MARRIN MRN: [id number] Date of Birth: [birth year]  Transition of Care [facility]) CM/SW [place], [place] Phone Number: [phone] 06/21/2019, 9:17 AM  Clinical Narrative:     CSW received call from [facility] they have insurance authorization with [facility], [place] informed them we continue to treat patient's wound infection at this time. Will continue to provide [facility] updates as patient nears medical readiness for discharge.   Expected Discharge Plan: Skilled Nursing Facility Barriers to Discharge: Ship broker, Continued Medical Work up  Expected Discharge Plan and Services Expected Discharge Plan: [place] In-house Referral: NA Discharge Planning Services: CM Consult Post Acute Care Choice: [place] Living arrangements for the past 2 months: Single Family Home                 DME Arranged: (NA)         HH Arranged: RN, PT [place] Agency: [place] Date [facility] Agency Contacted: 06/10/19 Time [place]: [id number] Representative spoke with at [place]: cory   Social Determinants of Health ([place]) Interventions    Readmission Risk Interventions No flowsheet data found.

## 2019-06-22 DIAGNOSIS — I5043 Acute on chronic combined systolic (congestive) and diastolic (congestive) heart failure: Secondary | ICD-10-CM

## 2019-06-22 LAB — GLUCOSE, CAPILLARY
Glucose-Capillary: 102 mg/dL — ABNORMAL HIGH (ref 70–99)
Glucose-Capillary: 104 mg/dL — ABNORMAL HIGH (ref 70–99)
Glucose-Capillary: 96 mg/dL (ref 70–99)
Glucose-Capillary: 99 mg/dL (ref 70–99)

## 2019-06-22 NOTE — Plan of Care (Signed)
  Problem: Clinical Measurements: Goal: Will remain free from infection Outcome: Progressing Goal: Respiratory complications will improve Outcome: Progressing   Problem: Safety: Goal: Ability to remain free from injury will improve Outcome: Progressing   Problem: Clinical Measurements: Goal: Will remain free from infection Outcome: Progressing Goal: Respiratory complications will improve Outcome: Progressing   Problem: Safety: Goal: Ability to remain free from injury will improve Outcome: Progressing

## 2019-06-22 NOTE — Progress Notes (Signed)
  NEUROSURGERY PROGRESS NOTE   No issues overnight.  No concerns this am  EXAM:  BP (!) 142/58 (BP Location: Right Arm)   Pulse 72   Temp 97.7 F (36.5 C) (Oral)   Resp 18   Ht 5' 2.99" (1.6 m)   Wt 82.7 kg   SpO2 100%   BMI 32.30 kg/m   Awake, alert Speech fluent, appropriate  MAEW, nonfocal  IMPRESSION/PLAN 78 y.o. female with chronic lumbar wound infection and nonhealing wound. Unable to undergo surgery yesterday due to po intake. Rescheduled for tomorrow for wound wash out, removal of hardware, placement of wound vac. - stressed importance of NPO at midnight - Will obtain intra op cultures

## 2019-06-22 NOTE — Progress Notes (Signed)
PROGRESS NOTE    Taylor Tyler  MPN:[id number] DOB: 1941/07/19 DOA: 05/30/2019 PCP: McLean-Scocuzza, Nino Glow, MD    Brief Narrative:  78 yo with diabetes mellitus type 2 presently not on medication, chronic diastolic hypertension, chronic kidney disease stage III, anemia, hypothyroidism, hyperlipidemia.   She presented to the ED on 10/26 for dyspnea after a fall out of bed the night before and being on the floor for about 8 hrs. Her son found her in the AM. She was short of breath for at least 2 days.   In ED, noted to be wheezing. Suspected to have acute diastolic CHF and started on Lasix.  Also noted to have AKI with Cr of 5.74 and K of 5.3.  Past Cr 2.64.  As it was felt that her renal failure was permanent, she was eventually started on dialysis. It was planned to d/c her to SNF however, course complicated by leukocytosis and fevers on 11/11. See below  She was transferred to [facility] on 11/11.  Assessment & Plan:   Principal Problem:   Acute CHF (congestive heart failure) (HCC) Active Problems:   Hypothyroidism   DM (diabetes mellitus), type 2 with renal complications (HCC)   Anemia   Acute renal failure superimposed on stage 3 chronic kidney disease (HCC)   Essential hypertension   Pressure injury of skin   Fever  Principal Problem:  Acute diastolic CHF (congestive heart failure)   Acute on chronic kidney disease stage 4 >> ESRD Metabolic acidosis - nephrology consulted but unfortunately, she did not diurese well and her Cr continued to rise - 11/4 - tunneled dialysis cath placed and underwent dialysis - Pt is anticipated to be arranged for long-term HD - an outpt dialysis spot has been found- her schedule will be Tu/Th/Sat  Active Problems: Leukocytosis, fever > Sepsis from infected back wound - temp spike 103 on evening of 11/11 - COVID test done for a planned d/c to SNF on 11/11 was negative (prior to temp spike) - blood cultures negative  - UA shows rare bacteria  and > 50 wbc with yeast - Urine culture shows > 100 K yeast (Not UTI per ID)  - source likely a very small draining wound on lumbar spine related to lumbar fusion surgery in 2018 which has never healed- she has had an infection in the past s/p I and D and wound vac 11/19. Wound appears re-infected - Neurosurgery consulted on 11/12 - ID consulted 11/12 and assisting with antibiotics - Continue on Vanc and Cefepime are being given with dialysis - Neurosurgery is planning on surgery 11/19  Acute toxic metabolic encephalopathy with underlying dementia - Family has noted memory loss for about 4-5 yrs- she has had delulsions and paranoia for about 2 years - This AM, mentation appears improved. Pt seems to be conversing appropriately   Hypothyroidism  - cont Synthroid as tolerated  DM ruled out - last A1c was 5.2- not on medication at home - sugars have been normal  AOCD - appreciate management per renal  - Seems stable at this time  HTN - on Norvasc, Labetalol, Hydralazine - BP seems stable at this time  HLD - Continued on Lipitor - Stable  Anemia of chronic disease - following  Decubitus ulcers  Pressure Injury 06/06/19 Sacrum Left Stage I -  Intact skin with non-blanchable redness of a localized area usually over a bony prominence. (Active)  06/06/19 1010  Location: Sacrum  Location Orientation: Left  Staging: Stage I -  Intact skin with non-blanchable redness of a localized area usually over a bony prominence.  Wound Description (Comments):   Present on Admission: No     Pressure Injury 06/12/19 Sacrum Left Stage II -  Partial thickness loss of dermis presenting as a shallow open ulcer with a red, pink wound bed without slough. Pink (Active)  06/12/19 2115  Location: Sacrum  Location Orientation: Left  Staging: Stage II -  Partial thickness loss of dermis presenting as a shallow open ulcer with a red, pink wound bed without slough.  Wound Description (Comments):  Pink  Present on Admission:      Pressure Injury 06/15/19 Buttocks Left Stage II -  Partial thickness loss of dermis presenting as a shallow open ulcer with a red, pink wound bed without slough. 100 % pinkk (Active)  06/15/19 1102  Location: Buttocks  Location Orientation: Left  Staging: Stage II -  Partial thickness loss of dermis presenting as a shallow open ulcer with a red, pink wound bed without slough.  Wound Description (Comments): 100 % pinkk  Present on Admission: No   DVT prophylaxis: SCD's Code Status: Full Family Communication: Pt in room, family not at bedside Disposition Plan: Uncertain at this time  Consultants:   Nephrology  Vascular surgery  NS  ID  Procedures:   11/4 tunneled cath  Antimicrobials: Anti-infectives (From admission, onward)   Start     Dose/Rate Route Frequency Ordered Stop   06/16/19 1800  ceFEPIme (MAXIPIME) 2 g in sodium chloride 0.9 % 100 mL IVPB     2 g 200 mL/hr over 30 Minutes Intravenous Once per day on Tue Thu Sat 06/15/19 1904     06/16/19 1200  vancomycin (VANCOCIN) IVPB 1000 mg/200 mL premix     1,000 mg 200 mL/hr over 60 Minutes Intravenous Every T-Th-Sa (Hemodialysis) 06/15/19 1904     06/15/19 2030  vancomycin (VANCOCIN) 2,000 mg in sodium chloride 0.9 % 500 mL IVPB     2,000 mg 250 mL/hr over 120 Minutes Intravenous  Once 06/15/19 1902 06/16/19 0010   06/15/19 2000  ceFEPIme (MAXIPIME) 2 g in sodium chloride 0.9 % 100 mL IVPB     2 g 200 mL/hr over 30 Minutes Intravenous  Once 06/15/19 1902 06/15/19 2028   06/08/19 0600  cefUROXime (ZINACEF) 1.5 g in sodium chloride 0.9 % 100 mL IVPB     1.5 g 200 mL/hr over 30 Minutes Intravenous On call to O.R. 06/07/19 1519 06/08/19 1328       Subjective: Without complaints, reports feeling better  Objective: Vitals:   06/22/19 0629 06/22/19 0633 06/22/19 0830 06/22/19 1113  BP: (!) 142/58  (!) 158/57 (!) 131/48  Pulse: 72  64 61  Resp: 18  18 18   Temp: 97.7 F (36.5 C)   97.6 F (36.4 C) 98.1 F (36.7 C)  TempSrc: Oral  Oral Oral  SpO2: 100%  100% 95%  Weight:  82.7 kg    Height:        Intake/Output Summary (Last 24 hours) at 06/22/2019 1315 Last data filed at 06/22/2019 0835 Gross per 24 hour  Intake 760 ml  Output 2000 ml  Net -1240 ml   Filed Weights   06/21/19 1830 06/21/19 2130 06/22/19 0633  Weight: 94.7 kg 92.9 kg 82.7 kg    Examination:  General exam: Appears calm and comfortable  Respiratory system: Clear to auscultation. Respiratory effort normal. Cardiovascular system: S1 & S2 heard, RRR Gastrointestinal system: Abdomen is nondistended, soft and nontender.  No organomegaly or masses felt. Normal bowel sounds heard. Central nervous system: Alert and oriented. No focal neurological deficits. Extremities: Symmetric 5 x 5 power. Skin: No rashes, lesions  Psychiatry: Judgement and insight appear normal. Mood & affect appropriate.   Data Reviewed: I have personally reviewed following labs and imaging studies  CBC: Recent Labs  Lab 06/15/19 1827 06/16/19 0711 06/17/19 0502 06/18/19 0541 06/21/19 2056  WBC 17.4* 16.2* 16.0* 13.2* 14.7*  HGB 7.9* 7.3* 8.0* 8.2* 7.6*  HCT 26.7* 25.1* 27.2* 27.6* 26.0*  MCV 87.8 87.8 88.9 87.1 88.1  PLT 234 239 239 244 443   Basic Metabolic Panel: Recent Labs  Lab 06/16/19 0712 06/18/19 0541 06/21/19 2056  NA 131* 132* 132*  K 3.8 4.0 3.6  CL 95* 93* 96*  CO2 23 23 23   GLUCOSE 95 87 103*  BUN 40* 31* 44*  CREATININE 5.82* 4.90* 6.12*  CALCIUM 8.2* 8.5* 8.2*  PHOS 4.2  --  3.8   GFR: Estimated Creatinine Clearance: 8 mL/min (A) (by C-G formula based on SCr of 6.12 mg/dL (H)). Liver Function Tests: Recent Labs  Lab 06/16/19 0712 06/21/19 2056  ALBUMIN 1.7* 1.7*   No results for input(s): LIPASE, AMYLASE in the last 168 hours. No results for input(s): AMMONIA in the last 168 hours. Coagulation Profile: No results for input(s): INR, PROTIME in the last 168 hours. Cardiac  Enzymes: No results for input(s): CKTOTAL, CKMB, CKMBINDEX, TROPONINI in the last 168 hours. BNP (last 3 results) No results for input(s): PROBNP in the last 8760 hours. HbA1C: No results for input(s): HGBA1C in the last 72 hours. CBG: Recent Labs  Lab 06/21/19 1130 06/21/19 1631 06/21/19 2230 06/22/19 0621 06/22/19 1113  GLUCAP 85 102* 123* 102* 104*   Lipid Profile: No results for input(s): CHOL, HDL, LDLCALC, TRIG, CHOLHDL, LDLDIRECT in the last 72 hours. Thyroid Function Tests: No results for input(s): TSH, T4TOTAL, FREET4, T3FREE, THYROIDAB in the last 72 hours. Anemia Panel: No results for input(s): VITAMINB12, FOLATE, FERRITIN, TIBC, IRON, RETICCTPCT in the last 72 hours. Sepsis Labs: Recent Labs  Lab 06/15/19 1827  LATICACIDVEN 0.8    Recent Results (from the past 240 hour(s))  SARS CORONAVIRUS 2 (TAT 6-24 HRS) Nasopharyngeal Nasopharyngeal Swab     Status: None   Collection Time: 06/15/19 11:20 AM   Specimen: Nasopharyngeal Swab  Result Value Ref Range Status   SARS Coronavirus 2 NEGATIVE NEGATIVE Final    Comment: (NOTE) SARS-CoV-2 target nucleic acids are NOT DETECTED. The SARS-CoV-2 RNA is generally detectable in upper and lower respiratory specimens during the acute phase of infection. Negative results do not preclude SARS-CoV-2 infection, do not rule out co-infections with other pathogens, and should not be used as the sole basis for treatment or other patient management decisions. Negative results must be combined with clinical observations, patient history, and epidemiological information. The expected result is Negative. Fact Sheet for Patients: SugarRoll.be Fact Sheet for Healthcare Providers: https://www.woods-mathews.com/ This test is not yet approved or cleared by the [place] FDA and  has been authorized for detection and/or diagnosis of SARS-CoV-2 by FDA under an Emergency Use Authorization (EUA).  This EUA will remain  in effect (meaning this test can be used) for the duration of the COVID-19 declaration under Section 56 4(b)(1) of the Act, 21 U.S.C. section 360bbb-3(b)(1), unless the authorization is terminated or revoked sooner. Performed at [facility], [place] [address] [place],  [address]   Culture, blood (routine x 2)  Status: None   Collection Time: 06/15/19 12:45 PM   Specimen: BLOOD  Result Value Ref Range Status   Specimen Description BLOOD RIGHT ANTECUBITAL  Final   Special Requests   Final    BOTTLES DRAWN AEROBIC ONLY Blood Culture results may not be optimal due to an inadequate volume of blood received in culture bottles   Culture   Final    NO GROWTH 5 DAYS Performed at [facility], [place] [address] [place], [place] [address]    Report Status 06/20/2019 FINAL  Final  Culture, blood (routine x 2)     Status: None   Collection Time: 06/15/19 12:49 PM   Specimen: BLOOD  Result Value Ref Range Status   Specimen Description BLOOD RIGHT ANTECUBITAL  Final   Special Requests   Final    BOTTLES DRAWN AEROBIC ONLY Blood Culture results may not be optimal due to an inadequate volume of blood received in culture bottles   Culture   Final    NO GROWTH 5 DAYS Performed at [facility], [place] [address], [place], [place] [address]    Report Status 06/20/2019 FINAL  Final  Culture, Urine     Status: Abnormal   Collection Time: 06/15/19  6:49 PM   Specimen: Urine, Random  Result Value Ref Range Status   Specimen Description URINE, RANDOM  Final   Special Requests   Final    NONE Performed at [facility], [place] [address], [place], [place] [address]    Culture >=100,000 COLONIES/mL YEAST (A)  Final   Report Status 06/17/2019 FINAL  Final     Radiology Studies: No results found.  Scheduled Meds: . amLODipine  10 mg Oral Daily  . atorvastatin  40 mg Oral q1800  . Chlorhexidine Gluconate Cloth  6 each Topical Q0600  . Chlorhexidine  Gluconate Cloth  6 each Topical Q0600  . darbepoetin (ARANESP) injection - DIALYSIS  150 mcg Intravenous Q Sat-HD  . gabapentin  200 mg Oral QHS  . hydrALAZINE  100 mg Oral Q8H  . labetalol  200 mg Oral BID  . levothyroxine  100 mcg Oral QAC breakfast   Continuous Infusions: . sodium chloride 10 mL/hr at 06/18/19 1746  . ceFEPime (MAXIPIME) IV 2 g (06/22/19 0129)  . vancomycin 200 mL/hr at 06/22/19 0500     LOS: 23 days   Marylu Lund, MD [facility] Pager On Amion  If 7PM-7AM, please contact night-coverage 06/22/2019, 1:15 PM

## 2019-06-22 NOTE — Progress Notes (Signed)
  [facility] Progress Note    Assessment/ Plan:   1. ESRD, new this admission.  On THS schedule currently--> has TTS spot [place],  Maturing AVF. Next HD 11/19 2. Fever/ inc WBC: NSG and ID following, will need spinal washout-- rescheduled for tomorrow 11/19 3. Anemia: IV Fe and ESA, CTM, transfuse prn 4. 2HPTH< stable 5. HTN: stable  6. Dispo: pending spinal washout and HW removal  Subjective:    Ate yesterday so washout rescheduled to tomorrow.  HD yesterday with 2L off.     Objective:   BP (!) 131/48 (BP Location: Right Arm)   Pulse 61   Temp 98.1 F (36.7 C) (Oral)   Resp 18   Ht 5' 2.99" (1.6 m)   Wt 82.7 kg   SpO2 95%   BMI 32.30 kg/m   Intake/Output Summary (Last 24 hours) at 06/22/2019 1149 Last data filed at 06/22/2019 5686 Gross per 24 hour  Intake 760 ml  Output 2000 ml  Net -1240 ml   Weight change: 0 kg  Physical Exam: Gen: NAD, lying flat in bed CVS:RRR no m/r/g Resp: clear bilaterally no c/w/r Abd: soft Ext: no LE edema NEURO: AAO x 3 nonfocal no astericis today ACCESS: R IJ TDC clean/dry/ intact, LUE AVF + T/B Imaging: No results found.  Labs: BMET Recent Labs  Lab 06/16/19 0712 06/18/19 0541 06/21/19 2056  NA 131* 132* 132*  K 3.8 4.0 3.6  CL 95* 93* 96*  CO2 23 23 23   GLUCOSE 95 87 103*  BUN 40* 31* 44*  CREATININE 5.82* 4.90* 6.12*  CALCIUM 8.2* 8.5* 8.2*  PHOS 4.2  --  3.8   CBC Recent Labs  Lab 06/16/19 0711 06/17/19 0502 06/18/19 0541 06/21/19 2056  WBC 16.2* 16.0* 13.2* 14.7*  HGB 7.3* 8.0* 8.2* 7.6*  HCT 25.1* 27.2* 27.6* 26.0*  MCV 87.8 88.9 87.1 88.1  PLT 239 239 244 239    Medications:    . amLODipine  10 mg Oral Daily  . atorvastatin  40 mg Oral q1800  . Chlorhexidine Gluconate Cloth  6 each Topical Q0600  . Chlorhexidine Gluconate Cloth  6 each Topical Q0600  . darbepoetin (ARANESP) injection - DIALYSIS  150 mcg Intravenous Q Sat-HD  . gabapentin  200 mg Oral QHS  . hydrALAZINE   100 mg Oral Q8H  . labetalol  200 mg Oral BID  . levothyroxine  100 mcg Oral QAC breakfast      Madelon Lips, MD 06/22/2019, 11:49 AM

## 2019-06-22 NOTE — Progress Notes (Signed)
Physical Therapy Treatment Patient Details Name: Taylor Tyler MRN: [id number] DOB: 1941/02/25 Today's Date: 06/22/2019    History of Present Illness 78 y.o. female admitted 05/30/19 after fall at home, laying on the floor at least 8 hours until found by son, also with worsening SOB and LE edema. VQ scan with low probability for PE. Worked up for HF, AKI. Course complicated by leukocytosis and fevers on 11/11. Pt with chronic lumbar wound infection/dehiscense with fusion from L3-S1; plan for hardward removal 11/19. Other PMH includes CKD III, DM2, DJD s/p lumbar sx (2018), arthritis.   PT Comments    Pt with increased fatigue this session, requiring modA for limited by mobility with RW due to inconsistent weakness and bilateral knee buckling. Pt at times appropriately conversant, but then at times with intermittent confusion and poor attention. Continue to recommend SNF-level therapies to maximize functional mobility and independence. Awaiting lumbar sx tomorrow. Will follow-up post-op.   Follow Up Recommendations  SNF;Supervision for mobility/OOB     Equipment Recommendations  None recommended by PT    Recommendations for Other Services       Precautions / Restrictions Precautions Precautions: Fall Restrictions Weight Bearing Restrictions: No    Mobility  Bed Mobility Overal bed mobility: Needs Assistance Bed Mobility: Rolling;Sidelying to Sit;Sit to Sidelying Rolling: Min guard Sidelying to sit: Mod assist;HOB elevated     Sit to sidelying: Min assist General bed mobility comments: Cues for log roll technique due to lower back pain; rolled with use of bed rail, modA for trunk elevation from sidelying. MinA to assist LEs into bed  Transfers Overall transfer level: Needs assistance Equipment used: Rolling walker (2 wheeled) Transfers: Sit to/from Stand Sit to Stand: Mod assist         General transfer comment: Initial standing from bed with modA, knees buckling pt  requiring assist for controlled sit EOB; 3x more sit<>stands from bed and BSC to RW with modA, pt with inconsistent knee buckling versus able to maintain standing  Ambulation/Gait Ambulation/Gait assistance: Min assist Gait Distance (Feet): 4 Feet Assistive device: Rolling walker (2 wheeled) Gait Pattern/deviations: Step-to pattern;Shuffle;Trunk flexed   Gait velocity interpretation: <1.31 ft/sec, indicative of household ambulator General Gait Details: Short, shuffling steps from bed>BSC>bed with RW, requiring intermittent minA to maintain standing due to inconsistent knee buckling and quick to fatigue; pt adamantly declining transfer to recliner despite max encouragement/education   Stairs             Wheelchair Mobility    Modified Rankin (Stroke Patients Only)       Balance Overall balance assessment: Needs assistance   Sitting balance-Leahy Scale: Fair     Standing balance support: Bilateral upper extremity supported Standing balance-Leahy Scale: Poor Standing balance comment: Reliant on BUE support and external assist, dependent for posterior pericare                            Cognition Arousal/Alertness: Awake/alert Behavior During Therapy: Flat affect Overall Cognitive Status: Impaired/Different from baseline Area of Impairment: Attention;Memory;Following commands;Safety/judgement;Awareness;Problem solving;Orientation                 Orientation Level: Disoriented to;Time;Situation Current Attention Level: Sustained Memory: Decreased recall of precautions;Decreased short-term memory Following Commands: Follows one step commands with increased time Safety/Judgement: Decreased awareness of safety;Decreased awareness of deficits Awareness: Intellectual Problem Solving: Slow processing;Difficulty sequencing;Requires verbal cues General Comments: Pt inconsistently confused and interactive, at times carrying conversation appropriately, but then  difficult to reason with/not carrying conversation when it comes to moving; increased time to follow commands, difficult to determine what is true cognitive impairment versus fatigue versus due to motivation/feeling poorly      Exercises      General Comments        Pertinent Vitals/Pain Pain Assessment: Faces Faces Pain Scale: Hurts a little bit Pain Location: Lower back Pain Descriptors / Indicators: Sore;Guarding;Moaning Pain Intervention(s): Monitored during session;Limited activity within patient's tolerance    Home Living                      Prior Function            PT Goals (current goals can now be found in the care plan section) Progress towards PT goals: Not progressing toward goals - comment(Limited by fatigue)    Frequency    Min 2X/week      PT Plan Current plan remains appropriate    Co-evaluation              AM-PAC PT "6 Clicks" Mobility   Outcome Measure  Help needed turning from your back to your side while in a flat bed without using bedrails?: A Little Help needed moving from lying on your back to sitting on the side of a flat bed without using bedrails?: A Lot Help needed moving to and from a bed to a chair (including a wheelchair)?: A Lot Help needed standing up from a chair using your arms (e.g., wheelchair or bedside chair)?: A Lot Help needed to walk in hospital room?: A Lot Help needed climbing 3-5 steps with a railing? : Total 6 Click Score: 12    End of Session Equipment Utilized During Treatment: Gait belt Activity Tolerance: Patient limited by fatigue Patient left: in bed;with call bell/phone within reach Nurse Communication: Mobility status PT Visit Diagnosis: Other abnormalities of gait and mobility (R26.89)     Time: 4982-6415 PT Time Calculation (min) (ACUTE ONLY): 27 min  Charges:  $Therapeutic Activity: 23-37 mins                     Mabeline Caras, PT, DPT Acute Rehabilitation Services  Pager  [phone] Office [place] 06/22/2019, 11:44 AM

## 2019-06-22 NOTE — Plan of Care (Signed)
  Problem: Activity: Goal: Risk for activity intolerance will decrease Outcome: Progressing   Problem: Pain Managment: Goal: General experience of comfort will improve Outcome: Progressing   Problem: Safety: Goal: Ability to remain free from injury will improve Outcome: Progressing   

## 2019-06-22 NOTE — Plan of Care (Signed)

## 2019-06-23 ENCOUNTER — Encounter (HOSPITAL_COMMUNITY)
Admission: EM | Disposition: A | Discharge status: Skilled Nursing Facility | Payer: Self-pay | Source: Home / Self Care | Attending: Internal Medicine

## 2019-06-23 ENCOUNTER — Inpatient Hospital Stay (HOSPITAL_COMMUNITY): Payer: Medicare Other | Admitting: Anesthesiology

## 2019-06-23 ENCOUNTER — Encounter (HOSPITAL_COMMUNITY): Payer: Self-pay | Admitting: Certified Registered Nurse Anesthetist

## 2019-06-23 HISTORY — PX: APPLICATION OF WOUND VAC: SHX5189

## 2019-06-23 HISTORY — PX: LUMBAR WOUND DEBRIDEMENT: SHX1988

## 2019-06-23 LAB — POCT I-STAT, CHEM 8
BUN: 14 mg/dL (ref 8–23)
Calcium, Ion: 1.11 mmol/L — ABNORMAL LOW (ref 1.15–1.40)
Chloride: 97 mmol/L — ABNORMAL LOW (ref 98–111)
Creatinine, Ser: 2.6 mg/dL — ABNORMAL HIGH (ref 0.44–1.00)
Glucose, Bld: 128 mg/dL — ABNORMAL HIGH (ref 70–99)
HCT: 32 % — ABNORMAL LOW (ref 36.0–46.0)
Hemoglobin: 10.9 g/dL — ABNORMAL LOW (ref 12.0–15.0)
Potassium: 3.5 mmol/L (ref 3.5–5.1)
Sodium: 135 mmol/L (ref 135–145)
TCO2: 26 mmol/L (ref 22–32)

## 2019-06-23 LAB — GLUCOSE, CAPILLARY
Glucose-Capillary: 95 mg/dL (ref 70–99)
Glucose-Capillary: 117 mg/dL — ABNORMAL HIGH (ref 70–99)
Glucose-Capillary: 144 mg/dL — ABNORMAL HIGH (ref 70–99)
Glucose-Capillary: 140 mg/dL — ABNORMAL HIGH (ref 70–99)

## 2019-06-23 LAB — CBC
HCT: 27 % — ABNORMAL LOW (ref 36.0–46.0)
Hemoglobin: 8 g/dL — ABNORMAL LOW (ref 12.0–15.0)
MCH: 26 pg (ref 26.0–34.0)
MCHC: 29.6 g/dL — ABNORMAL LOW (ref 30.0–36.0)
MCV: 87.7 fL (ref 80.0–100.0)
Platelets: 180 10*3/uL (ref 150–400)
RBC: 3.08 MIL/uL — ABNORMAL LOW (ref 3.87–5.11)
RDW: 16.8 % — ABNORMAL HIGH (ref 11.5–15.5)
WBC: 14.7 10*3/uL — ABNORMAL HIGH (ref 4.0–10.5)
nRBC: 0.7 % — ABNORMAL HIGH (ref 0.0–0.2)

## 2019-06-23 LAB — SARS CORONAVIRUS 2 (TAT 6-24 HRS): SARS Coronavirus 2: NEGATIVE

## 2019-06-23 LAB — BASIC METABOLIC PANEL
Anion gap: 13 (ref 5–15)
BUN: 39 mg/dL — ABNORMAL HIGH (ref 8–23)
CO2: 22 mmol/L (ref 22–32)
Calcium: 8.5 mg/dL — ABNORMAL LOW (ref 8.9–10.3)
Chloride: 98 mmol/L (ref 98–111)
Creatinine, Ser: 5.8 mg/dL — ABNORMAL HIGH (ref 0.44–1.00)
GFR calc Af Amer: 8 mL/min — ABNORMAL LOW (ref >60)
GFR calc non Af Amer: 7 mL/min — ABNORMAL LOW (ref >60)
Glucose, Bld: 99 mg/dL (ref 70–99)
Potassium: 3.9 mmol/L (ref 3.5–5.1)
Sodium: 133 mmol/L — ABNORMAL LOW (ref 135–145)

## 2019-06-23 SURGERY — LUMBAR WOUND DEBRIDEMENT
Anesthesia: General

## 2019-06-23 MED ORDER — HEPARIN SODIUM (PORCINE) 1000 UNIT/ML IJ SOLN
INTRAMUSCULAR | Status: AC
  Administered 2019-06-23: 3400 [IU]
  Filled 2019-06-23: qty 4

## 2019-06-23 MED ORDER — ALBUMIN HUMAN 5 % IV SOLN
INTRAVENOUS | Status: DC | PRN
  Administered 2019-06-23: 14:00:00 via INTRAVENOUS

## 2019-06-23 MED ORDER — NEOSTIGMINE METHYLSULFATE 10 MG/10ML IV SOLN
INTRAVENOUS | Status: DC | PRN
  Administered 2019-06-23: 3 mg via INTRAVENOUS

## 2019-06-23 MED ORDER — ONDANSETRON HCL 4 MG/2ML IJ SOLN: 4.0000 mg | Freq: Once | INTRAMUSCULAR | Status: DC | PRN

## 2019-06-23 MED ORDER — VANCOMYCIN HCL IN DEXTROSE 1-5 GM/200ML-% IV SOLN
INTRAVENOUS | Status: AC
  Administered 2019-06-23: 1000 mg via INTRAVENOUS
  Filled 2019-06-23: qty 200

## 2019-06-23 MED ORDER — THROMBIN 5000 UNITS EX SOLR
CUTANEOUS | Status: AC
  Filled 2019-06-23: qty 5000

## 2019-06-23 MED ORDER — BUPIVACAINE HCL (PF) 0.5 % IJ SOLN
INTRAMUSCULAR | Status: AC
  Filled 2019-06-23: qty 30

## 2019-06-23 MED ORDER — 0.9 % SODIUM CHLORIDE (POUR BTL) OPTIME
TOPICAL | Status: DC | PRN
  Administered 2019-06-23: 1000 mL

## 2019-06-23 MED ORDER — PROPOFOL 10 MG/ML IV BOLUS
INTRAVENOUS | Status: AC
  Filled 2019-06-23: qty 20

## 2019-06-23 MED ORDER — SODIUM CHLORIDE 0.9 % IV SOLN
2.0000 g | INTRAVENOUS | Status: DC
  Filled 2019-06-23: qty 2

## 2019-06-23 MED ORDER — DEXAMETHASONE SODIUM PHOSPHATE 10 MG/ML IJ SOLN
INTRAMUSCULAR | Status: DC | PRN
  Administered 2019-06-23: 4 mg via INTRAVENOUS

## 2019-06-23 MED ORDER — SODIUM CHLORIDE 0.9 % IR SOLN
Status: DC | PRN
  Administered 2019-06-23: 3000 mL

## 2019-06-23 MED ORDER — ROCURONIUM BROMIDE 10 MG/ML (PF) SYRINGE
PREFILLED_SYRINGE | INTRAVENOUS | Status: DC | PRN
  Administered 2019-06-23: 50 mg via INTRAVENOUS
  Administered 2019-06-23: 20 mg via INTRAVENOUS

## 2019-06-23 MED ORDER — ONDANSETRON HCL 4 MG/2ML IJ SOLN
INTRAMUSCULAR | Status: AC
  Filled 2019-06-23: qty 2

## 2019-06-23 MED ORDER — EPHEDRINE SULFATE-NACL 50-0.9 MG/10ML-% IV SOSY
PREFILLED_SYRINGE | INTRAVENOUS | Status: DC | PRN
  Administered 2019-06-23: 10 mg via INTRAVENOUS
  Administered 2019-06-23 (×3): 5 mg via INTRAVENOUS

## 2019-06-23 MED ORDER — OXYCODONE HCL 5 MG PO TABS: 5.0000 mg | ORAL_TABLET | Freq: Once | ORAL | Status: DC | PRN

## 2019-06-23 MED ORDER — SODIUM CHLORIDE 0.9 % IV SOLN
INTRAVENOUS | Status: DC | PRN
  Administered 2019-06-23: 500 mL

## 2019-06-23 MED ORDER — VASOPRESSIN 20 UNIT/ML IV SOLN
INTRAVENOUS | Status: AC
  Filled 2019-06-23: qty 1

## 2019-06-23 MED ORDER — GLYCOPYRROLATE 0.2 MG/ML IJ SOLN
INTRAMUSCULAR | Status: DC | PRN
  Administered 2019-06-23: 0.2 mg via INTRAVENOUS

## 2019-06-23 MED ORDER — VASOPRESSIN 20 UNIT/ML IV SOLN
INTRAVENOUS | Status: DC | PRN
  Administered 2019-06-23 (×2): 1 [IU] via INTRAVENOUS

## 2019-06-23 MED ORDER — ROCURONIUM BROMIDE 10 MG/ML (PF) SYRINGE
PREFILLED_SYRINGE | INTRAVENOUS | Status: AC
  Filled 2019-06-23: qty 10

## 2019-06-23 MED ORDER — FENTANYL CITRATE (PF) 250 MCG/5ML IJ SOLN
INTRAMUSCULAR | Status: AC
  Filled 2019-06-23: qty 5

## 2019-06-23 MED ORDER — FENTANYL CITRATE (PF) 100 MCG/2ML IJ SOLN: 25.0000 ug | INTRAMUSCULAR | Status: DC | PRN

## 2019-06-23 MED ORDER — LIDOCAINE-EPINEPHRINE 1 %-1:100000 IJ SOLN
INTRAMUSCULAR | Status: AC
  Filled 2019-06-23: qty 1

## 2019-06-23 MED ORDER — SODIUM CHLORIDE 0.9 % IV SOLN
INTRAVENOUS | Status: DC
  Administered 2019-06-23 – 2019-06-29 (×3): via INTRAVENOUS

## 2019-06-23 MED ORDER — PROPOFOL 10 MG/ML IV BOLUS
INTRAVENOUS | Status: DC | PRN
  Administered 2019-06-23: 80 mg via INTRAVENOUS

## 2019-06-23 MED ORDER — LIDOCAINE 20MG/ML (2%) 15 ML SYRINGE OPTIME
INTRAMUSCULAR | Status: DC | PRN
  Administered 2019-06-23: 40 mg via INTRAVENOUS

## 2019-06-23 MED ORDER — OXYCODONE HCL 5 MG/5ML PO SOLN: 5.0000 mg | Freq: Once | ORAL | Status: DC | PRN

## 2019-06-23 MED ORDER — DEXAMETHASONE SODIUM PHOSPHATE 10 MG/ML IJ SOLN
INTRAMUSCULAR | Status: AC
  Filled 2019-06-23: qty 1

## 2019-06-23 MED ORDER — LIDOCAINE 2% (20 MG/ML) 5 ML SYRINGE
INTRAMUSCULAR | Status: AC
  Filled 2019-06-23: qty 5

## 2019-06-23 MED ORDER — PHENYLEPHRINE HCL-NACL 10-0.9 MG/250ML-% IV SOLN
INTRAVENOUS | Status: DC | PRN
  Administered 2019-06-23: 25 ug/min via INTRAVENOUS

## 2019-06-23 MED ORDER — THROMBIN 5000 UNITS EX SOLR
OROMUCOSAL | Status: DC | PRN
  Administered 2019-06-23: 5 mL via TOPICAL

## 2019-06-23 MED ORDER — FENTANYL CITRATE (PF) 100 MCG/2ML IJ SOLN
INTRAMUSCULAR | Status: DC | PRN
  Administered 2019-06-23: 50 ug via INTRAVENOUS

## 2019-06-23 SURGICAL SUPPLY — 42 items
BAG DECANTER FOR FLEXI CONT (MISCELLANEOUS) ×3 IMPLANT
CANISTER SUCT 3000ML PPV (MISCELLANEOUS) ×6 IMPLANT
CANISTER WOUNDNEG PRESSURE 500 (CANNISTER) ×3 IMPLANT
CARTRIDGE OIL MAESTRO DRILL (MISCELLANEOUS) IMPLANT
COVER WAND RF STERILE (DRAPES) IMPLANT
DIFFUSER DRILL AIR PNEUMATIC (MISCELLANEOUS) IMPLANT
DRAPE LAPAROTOMY 100X72X124 (DRAPES) ×3 IMPLANT
DRSG VAC ATS MED SENSATRAC (GAUZE/BANDAGES/DRESSINGS) ×3 IMPLANT
ELECT REM PT RETURN 9FT ADLT (ELECTROSURGICAL) ×3
ELECTRODE REM PT RTRN 9FT ADLT (ELECTROSURGICAL) ×1 IMPLANT
GAUZE 4X4 16PLY RFD (DISPOSABLE) ×3 IMPLANT
GAUZE SPONGE 4X4 12PLY STRL (GAUZE/BANDAGES/DRESSINGS) IMPLANT
GLOVE BIO SURGEON STRL SZ7.5 (GLOVE) IMPLANT
GLOVE BIOGEL PI IND STRL 7.5 (GLOVE) ×2 IMPLANT
GLOVE BIOGEL PI INDICATOR 7.5 (GLOVE) ×4
GLOVE ECLIPSE 7.0 STRL STRAW (GLOVE) ×3 IMPLANT
GLOVE EXAM NITRILE XL STR (GLOVE) IMPLANT
GOWN STRL REUS W/ TWL LRG LVL3 (GOWN DISPOSABLE) ×1 IMPLANT
GOWN STRL REUS W/ TWL XL LVL3 (GOWN DISPOSABLE) IMPLANT
GOWN STRL REUS W/TWL 2XL LVL3 (GOWN DISPOSABLE) IMPLANT
GOWN STRL REUS W/TWL LRG LVL3 (GOWN DISPOSABLE) ×2
GOWN STRL REUS W/TWL XL LVL3 (GOWN DISPOSABLE)
IV NS IRRIG 3000ML ARTHROMATIC (IV SOLUTION) ×3 IMPLANT
KIT BASIN OR (CUSTOM PROCEDURE TRAY) ×3 IMPLANT
KIT TURNOVER KIT B (KITS) ×3 IMPLANT
NEEDLE HYPO 22GX1.5 SAFETY (NEEDLE) ×3 IMPLANT
NS IRRIG 1000ML POUR BTL (IV SOLUTION) ×3 IMPLANT
OIL CARTRIDGE MAESTRO DRILL (MISCELLANEOUS)
PACK LAMINECTOMY NEURO (CUSTOM PROCEDURE TRAY) ×3 IMPLANT
PAD ARMBOARD 7.5X6 YLW CONV (MISCELLANEOUS) ×9 IMPLANT
SET CYSTO W/LG BORE CLAMP LF (SET/KITS/TRAYS/PACK) ×3 IMPLANT
SPONGE SURGIFOAM ABS GEL SZ50 (HEMOSTASIS) IMPLANT
SUT VIC AB 0 CT1 18XCR BRD8 (SUTURE) IMPLANT
SUT VIC AB 0 CT1 8-18 (SUTURE)
SUT VIC AB 1 CT1 18XBRD ANBCTR (SUTURE) ×1 IMPLANT
SUT VIC AB 1 CT1 8-18 (SUTURE) ×2
SUT VICRYL 3-0 RB1 18 ABS (SUTURE) IMPLANT
SWAB COLLECTION DEVICE MRSA (MISCELLANEOUS) IMPLANT
SWAB CULTURE ESWAB REG 1ML (MISCELLANEOUS) IMPLANT
TOWEL GREEN STERILE (TOWEL DISPOSABLE) ×3 IMPLANT
TOWEL GREEN STERILE FF (TOWEL DISPOSABLE) ×3 IMPLANT
WATER STERILE IRR 1000ML POUR (IV SOLUTION) ×3 IMPLANT

## 2019-06-23 NOTE — Transfer of Care (Signed)
Immediate Anesthesia Transfer of Care Note  Patient: Taylor Tyler  Procedure(s) Performed: LUMBAR WOUND DEBRIDEMENT WITH HARDWARE REMOVAL (N/A ) APPLICATION OF WOUND VAC (N/A )  Patient Location: PACU  Anesthesia Type:General  Level of Consciousness: awake and patient cooperative  Airway & Oxygen Therapy: Patient Spontanous Breathing  Post-op Assessment: Report given to RN, Post -op Vital signs reviewed and stable and Patient moving all extremities  Post vital signs: Reviewed and stable  Last Vitals:  Vitals Value Taken Time  BP 118/46 06/23/19 1405  Temp    Pulse 68 06/23/19 1404  Resp 18 06/23/19 1406  SpO2 98 % 06/23/19 1404  Vitals shown include unvalidated device data.  Last Pain:  Vitals:   06/23/19 0700  TempSrc: Oral  PainSc: 0-No pain      Patients Stated Pain Goal: 0 (83/09/40 7680)  Complications: No apparent anesthesia complications

## 2019-06-23 NOTE — Anesthesia Procedure Notes (Signed)
Procedure Name: Intubation Date/Time: 06/23/2019 12:36 PM Performed by: Lowella Dell, CRNA Pre-anesthesia Checklist: Patient identified, Emergency Drugs available, Suction available and Patient being monitored Patient Re-evaluated:Patient Re-evaluated prior to induction Oxygen Delivery Method: Circle System Utilized Preoxygenation: Pre-oxygenation with 100% oxygen Induction Type: IV induction Ventilation: Mask ventilation without difficulty Laryngoscope Size: Mac and 3 Grade View: Grade II Tube type: Oral Tube size: 7.0 mm Number of attempts: 1 Airway Equipment and Method: Stylet Placement Confirmation: ETT inserted through vocal cords under direct vision,  positive ETCO2 and breath sounds checked- equal and bilateral Secured at: 21 cm Tube secured with: Tape Dental Injury: Teeth and Oropharynx as per pre-operative assessment

## 2019-06-23 NOTE — Progress Notes (Signed)
  NEUROSURGERY PROGRESS NOTE   No issues overnight. Had HD this am.  EXAM:  BP (!) 108/52   Pulse 68   Temp 98 F (36.7 C) (Oral)   Resp 13   Ht 5\' 3"  (1.6 m)   Wt 83.7 kg   SpO2 97%   BMI 32.70 kg/m   Awake, alert, oriented  Speech fluent, appropriate  CN grossly intact  MAE well  IMPRESSION:  78 y.o. female with chronic lumbar wound infection, possible from seeded hardware with CT suggesting solid fusion L3-S1.  PLAN: - Will proceed with wound exploration, debridement, hardware removal, and application of woundvac for closure  I have reviewed the indications for surgery as well as the risks and benefits with the patient. All questions today were answered.

## 2019-06-23 NOTE — Progress Notes (Signed)
PROGRESS NOTE    Taylor Tyler  SHF:[id number] DOB: 1941-04-17 DOA: 05/30/2019 PCP: McLean-Scocuzza, Nino Glow, MD    Brief Narrative:  78 yo with diabetes mellitus type 2 presently not on medication, chronic diastolic hypertension, chronic kidney disease stage III, anemia, hypothyroidism, hyperlipidemia.   She presented to the ED on 10/26 for dyspnea after a fall out of bed the night before and being on the floor for about 8 hrs. Her son found her in the AM. She was short of breath for at least 2 days.   In ED, noted to be wheezing. Suspected to have acute diastolic CHF and started on Lasix.  Also noted to have AKI with Cr of 5.74 and K of 5.3.  Past Cr 2.64.  As it was felt that her renal failure was permanent, she was eventually started on dialysis. It was planned to d/c her to SNF however, course complicated by leukocytosis and fevers on 11/11. See below  She was transferred to [facility] on 11/11.  Assessment & Plan:   Principal Problem:   Acute CHF (congestive heart failure) (HCC) Active Problems:   Hypothyroidism   DM (diabetes mellitus), type 2 with renal complications (HCC)   Anemia   Acute renal failure superimposed on stage 3 chronic kidney disease (HCC)   Essential hypertension   Pressure injury of skin   Fever  Principal Problem:  Acute diastolic CHF (congestive heart failure)   Acute on chronic kidney disease stage 4 >> ESRD Metabolic acidosis - nephrology consulted but unfortunately, she did not diurese well and her Cr continued to rise - 11/4 - tunneled dialysis cath placed and underwent dialysis - Pt is anticipated to be arranged for long-term HD - an outpt dialysis spot has been found- her schedule will be Tu/Th/Sat  Active Problems: Leukocytosis, fever > Sepsis from infected back wound - temp spike 103 on evening of 11/11 - COVID test done for a planned d/c to SNF on 11/11 was negative (prior to temp spike) - blood cultures negative  - UA shows rare bacteria  and > 50 wbc with yeast - Urine culture shows > 100 K yeast (Not UTI per ID)  - source likely a very small draining wound on lumbar spine related to lumbar fusion surgery in 2018 which has never healed- she has had an infection in the past s/p I and D and wound vac 11/19. Wound appears re-infected - Neurosurgery consulted on 11/12 - ID consulted 11/12 and assisting with antibiotics - Continue on Vanc and Cefepime are being given with dialysis - Neurosurgery following and pt now s/p lumbar wound debridement with hardware removal  Acute toxic metabolic encephalopathy with underlying dementia - Family has noted memory loss for about 4-5 yrs- she has had delulsions and paranoia for about 2 years - Stable this AM. Conversing appropriately   Hypothyroidism  - cont Synthroid as tolerated  DM ruled out - last A1c was 5.2- not on medication at home - sugars stable and controlled  AOCD - appreciate management per renal  - Stable at this time  HTN - on Norvasc, Labetalol, Hydralazine - BP stable and controlled  HLD - Continued on Lipitor - Stable at this time  Anemia of chronic disease - following  Decubitus ulcers  Pressure Injury 06/06/19 Sacrum Left Stage I -  Intact skin with non-blanchable redness of a localized area usually over a bony prominence. (Active)  06/06/19 1010  Location: Sacrum  Location Orientation: Left  Staging: Stage I -  Intact skin with non-blanchable redness of a localized area usually over a bony prominence.  Wound Description (Comments):   Present on Admission: No     Pressure Injury 06/12/19 Sacrum Left Stage II -  Partial thickness loss of dermis presenting as a shallow open ulcer with a red, pink wound bed without slough. Pink (Active)  06/12/19 2115  Location: Sacrum  Location Orientation: Left  Staging: Stage II -  Partial thickness loss of dermis presenting as a shallow open ulcer with a red, pink wound bed without slough.  Wound Description  (Comments): Pink  Present on Admission:      Pressure Injury 06/15/19 Buttocks Left Stage II -  Partial thickness loss of dermis presenting as a shallow open ulcer with a red, pink wound bed without slough. 100 % pinkk (Active)  06/15/19 1102  Location: Buttocks  Location Orientation: Left  Staging: Stage II -  Partial thickness loss of dermis presenting as a shallow open ulcer with a red, pink wound bed without slough.  Wound Description (Comments): 100 % pinkk  Present on Admission: No   DVT prophylaxis: SCD's Code Status: Full Family Communication: Pt in room, family not at bedside Disposition Plan: Uncertain at this time  Consultants:   Nephrology  Vascular surgery  NS  ID  Procedures:   11/4 tunneled cath  11/19 Lumbar wound debridement and hardware removal  Antimicrobials: Anti-infectives (From admission, onward)   Start     Dose/Rate Route Frequency Ordered Stop   06/23/19 1315  ceFEPIme (MAXIPIME) 2 g in sodium chloride 0.9 % 100 mL IVPB  Status:  Discontinued     2 g 200 mL/hr over 30 Minutes Intravenous To Surgery 06/23/19 1301 06/23/19 1624   06/23/19 1200  bacitracin 50,000 Units in sodium chloride 0.9 % 500 mL irrigation  Status:  Discontinued       As needed 06/23/19 1203 06/23/19 1359   06/16/19 1800  ceFEPIme (MAXIPIME) 2 g in sodium chloride 0.9 % 100 mL IVPB     2 g 200 mL/hr over 30 Minutes Intravenous Once per day on Tue Thu Sat 06/15/19 1904     06/16/19 1200  vancomycin (VANCOCIN) IVPB 1000 mg/200 mL premix     1,000 mg 200 mL/hr over 60 Minutes Intravenous Every T-Th-Sa (Hemodialysis) 06/15/19 1904     06/15/19 2030  vancomycin (VANCOCIN) 2,000 mg in sodium chloride 0.9 % 500 mL IVPB     2,000 mg 250 mL/hr over 120 Minutes Intravenous  Once 06/15/19 1902 06/16/19 0010   06/15/19 2000  ceFEPIme (MAXIPIME) 2 g in sodium chloride 0.9 % 100 mL IVPB     2 g 200 mL/hr over 30 Minutes Intravenous  Once 06/15/19 1902 06/15/19 2028   06/08/19 0600   cefUROXime (ZINACEF) 1.5 g in sodium chloride 0.9 % 100 mL IVPB     1.5 g 200 mL/hr over 30 Minutes Intravenous On call to O.R. 06/07/19 1519 06/08/19 1328      Subjective: No complaints when seen on HD this AM  Objective: Vitals:   06/23/19 1605 06/23/19 1615 06/23/19 1616 06/23/19 1645  BP: (!) 126/44   (!) 123/45  Pulse: 63 65 64 65  Resp: 19 17 17 17   Temp:  97.6 F (36.4 C)  97.6 F (36.4 C)  TempSrc:    Oral  SpO2: 98% 97% 97% 98%  Weight:      Height:        Intake/Output Summary (Last 24 hours) at 06/23/2019 1728 Last  data filed at 06/23/2019 1616 Gross per 24 hour  Intake 1240 ml  Output 2449 ml  Net -1209 ml   Filed Weights   06/23/19 0655 06/23/19 0700 06/23/19 1109  Weight: 83.7 kg 83.7 kg 83.7 kg    Examination: General exam: Conversant, in no acute distress Respiratory system: normal chest rise, clear, no audible wheezing Cardiovascular system: regular rhythm, s1-s2 Gastrointestinal system: Nondistended, nontender, pos BS Central nervous system: No seizures, no tremors Extremities: No cyanosis, no joint deformities Skin: No rashes, no pallor Psychiatry: Affect normal // no auditory hallucinations   Data Reviewed: I have personally reviewed following labs and imaging studies  CBC: Recent Labs  Lab 06/17/19 0502 06/18/19 0541 06/21/19 2056 06/23/19 0442 06/23/19 1201  WBC 16.0* 13.2* 14.7* 14.7*  --   HGB 8.0* 8.2* 7.6* 8.0* 10.9*  HCT 27.2* 27.6* 26.0* 27.0* 32.0*  MCV 88.9 87.1 88.1 87.7  --   PLT 239 244 239 180  --    Basic Metabolic Panel: Recent Labs  Lab 06/18/19 0541 06/21/19 2056 06/23/19 0442 06/23/19 1201  NA 132* 132* 133* 135  K 4.0 3.6 3.9 3.5  CL 93* 96* 98 97*  CO2 23 23 22   --   GLUCOSE 87 103* 99 128*  BUN 31* 44* 39* 14  CREATININE 4.90* 6.12* 5.80* 2.60*  CALCIUM 8.5* 8.2* 8.5*  --   PHOS  --  3.8  --   --    GFR: Estimated Creatinine Clearance: 18.9 mL/min (A) (by C-G formula based on SCr of 2.6 mg/dL  (H)). Liver Function Tests: Recent Labs  Lab 06/21/19 2056  ALBUMIN 1.7*   No results for input(s): LIPASE, AMYLASE in the last 168 hours. No results for input(s): AMMONIA in the last 168 hours. Coagulation Profile: No results for input(s): INR, PROTIME in the last 168 hours. Cardiac Enzymes: No results for input(s): CKTOTAL, CKMB, CKMBINDEX, TROPONINI in the last 168 hours. BNP (last 3 results) No results for input(s): PROBNP in the last 8760 hours. HbA1C: No results for input(s): HGBA1C in the last 72 hours. CBG: Recent Labs  Lab 06/22/19 1113 06/22/19 1633 06/22/19 2137 06/23/19 0631 06/23/19 1405  GLUCAP 104* 96 99 95 117*   Lipid Profile: No results for input(s): CHOL, HDL, LDLCALC, TRIG, CHOLHDL, LDLDIRECT in the last 72 hours. Thyroid Function Tests: No results for input(s): TSH, T4TOTAL, FREET4, T3FREE, THYROIDAB in the last 72 hours. Anemia Panel: No results for input(s): VITAMINB12, FOLATE, FERRITIN, TIBC, IRON, RETICCTPCT in the last 72 hours. Sepsis Labs: No results for input(s): PROCALCITON, LATICACIDVEN in the last 168 hours.  Recent Results (from the past 240 hour(s))  SARS CORONAVIRUS 2 (TAT 6-24 HRS) Nasopharyngeal Nasopharyngeal Swab     Status: None   Collection Time: 06/15/19 11:20 AM   Specimen: Nasopharyngeal Swab  Result Value Ref Range Status   SARS Coronavirus 2 NEGATIVE NEGATIVE Final    Comment: (NOTE) SARS-CoV-2 target nucleic acids are NOT DETECTED. The SARS-CoV-2 RNA is generally detectable in upper and lower respiratory specimens during the acute phase of infection. Negative results do not preclude SARS-CoV-2 infection, do not rule out co-infections with other pathogens, and should not be used as the sole basis for treatment or other patient management decisions. Negative results must be combined with clinical observations, patient history, and epidemiological information. The expected result is Negative. Fact Sheet for Patients:  SugarRoll.be Fact Sheet for Healthcare Providers: https://www.woods-mathews.com/ This test is not yet approved or cleared by the [place] FDA and  has been authorized for detection and/or diagnosis of SARS-CoV-2 by FDA under an Emergency Use Authorization (EUA). This EUA will remain  in effect (meaning this test can be used) for the duration of the COVID-19 declaration under Section 56 4(b)(1) of the Act, 21 U.S.C. section 360bbb-3(b)(1), unless the authorization is terminated or revoked sooner. Performed at [facility], [place] [address] [place], [place] [address]   Culture, blood (routine x 2)     Status: None   Collection Time: 06/15/19 12:45 PM   Specimen: BLOOD  Result Value Ref Range Status   Specimen Description BLOOD RIGHT ANTECUBITAL  Final   Special Requests   Final    BOTTLES DRAWN AEROBIC ONLY Blood Culture results may not be optimal due to an inadequate volume of blood received in culture bottles   Culture   Final    NO GROWTH 5 DAYS Performed at [facility], [place] [address], [place], [place] [address]    Report Status 06/20/2019 FINAL  Final  Culture, blood (routine x 2)     Status: None   Collection Time: 06/15/19 12:49 PM   Specimen: BLOOD  Result Value Ref Range Status   Specimen Description BLOOD RIGHT ANTECUBITAL  Final   Special Requests   Final    BOTTLES DRAWN AEROBIC ONLY Blood Culture results may not be optimal due to an inadequate volume of blood received in culture bottles   Culture   Final    NO GROWTH 5 DAYS Performed at [facility], [place] [address], [place], [place] [address]    Report Status 06/20/2019 FINAL  Final  Culture, Urine     Status: Abnormal   Collection Time: 06/15/19  6:49 PM   Specimen: Urine, Random  Result Value Ref Range Status   Specimen Description URINE, RANDOM  Final   Special Requests   Final    NONE Performed at [facility], [place] [address]  [place], [place] [address]    Culture >=100,000 COLONIES/mL YEAST (A)  Final   Report Status 06/17/2019 FINAL  Final  SARS CORONAVIRUS 2 (TAT 6-24 HRS)     Status: None   Collection Time: 06/23/19  5:40 AM  Result Value Ref Range Status   SARS Coronavirus 2 NEGATIVE NEGATIVE Final    Comment: (NOTE) SARS-CoV-2 target nucleic acids are NOT DETECTED. The SARS-CoV-2 RNA is generally detectable in upper and lower respiratory specimens during the acute phase of infection. Negative results do not preclude SARS-CoV-2 infection, do not rule out co-infections with other pathogens, and should not be used as the sole basis for treatment or other patient management decisions. Negative results must be combined with clinical observations, patient history, and epidemiological information. The expected result is Negative. Fact Sheet for Patients: SugarRoll.be Fact Sheet for Healthcare Providers: https://www.woods-mathews.com/ This test is not yet approved or cleared by the [place] FDA and  has been authorized for detection and/or diagnosis of SARS-CoV-2 by FDA under an Emergency Use Authorization (EUA). This EUA will remain  in effect (meaning this test can be used) for the duration of the COVID-19 declaration under Section 56 4(b)(1) of the Act, 21 U.S.C. section 360bbb-3(b)(1), unless the authorization is terminated or revoked sooner. Performed at [facility], [place] [address], [place], [place] [address]   Aerobic/Anaerobic Culture (surgical/deep wound)     Status: None (Preliminary result)   Collection Time: 06/23/19  1:02 PM   Specimen: Soft Tissue, Other  Result Value Ref  Range Status   Specimen Description TISSUE LUMBAR  Final   Special Requests PATIENT ON FOLLOWING VANC  Final   Gram Stain   Final    MODERATE WBC PRESENT,BOTH PMN AND MONONUCLEAR NO ORGANISMS SEEN Performed at [facility], [address] [place], [place] [address]     Culture PENDING  Incomplete   Report Status PENDING  Incomplete     Radiology Studies: No results found.  Scheduled Meds: . amLODipine  10 mg Oral Daily  . atorvastatin  40 mg Oral q1800  . Chlorhexidine Gluconate Cloth  6 each Topical Q0600  . Chlorhexidine Gluconate Cloth  6 each Topical Q0600  . darbepoetin (ARANESP) injection - DIALYSIS  150 mcg Intravenous Q Sat-HD  . gabapentin  200 mg Oral QHS  . hydrALAZINE  100 mg Oral Q8H  . labetalol  200 mg Oral BID  . levothyroxine  100 mcg Oral QAC breakfast   Continuous Infusions: . sodium chloride 10 mL/hr at 06/18/19 1746  . sodium chloride 10 mL/hr at 06/23/19 1205  . ceFEPime (MAXIPIME) IV 2 g (06/22/19 0129)  . vancomycin Stopped (06/23/19 1027)     LOS: 24 days   Marylu Lund, MD [facility] Pager On Amion  If 7PM-7AM, please contact night-coverage 06/23/2019, 5:28 PM

## 2019-06-23 NOTE — Progress Notes (Signed)
Patient's gold ring obtained from short stay and place in a clear bag with her name on it. Ring then placed in a patient belongings bag with her name on it. Belongings will be sent to her new room after her procedure.

## 2019-06-23 NOTE — Anesthesia Preprocedure Evaluation (Addendum)
Anesthesia Evaluation  Patient identified by MRN, date of birth, ID band Patient confused    Reviewed: Allergy & Precautions, NPO status , Patient's Chart, lab work & pertinent test results  History of Anesthesia Complications Negative for: history of anesthetic complications  Airway Mallampati: II  TM Distance: >3 FB Neck ROM: Full    Dental  (+) Dental Advisory Given   Pulmonary    Pulmonary exam normal        Cardiovascular hypertension, Pt. on medications and Pt. on home beta blockers +CHF (diastolic)  Normal cardiovascular exam+ Valvular Problems/Murmurs    '20 TTE - EF 55 to 60%. Moderately increased left ventricular hypertrophy. Grade II diastolic dysfunction (pseudonormalization). LA and RA were mildly dilated. Trace MR and TR. Mild aortic valve sclerosis without stenosis. Moderate PR.    Neuro/Psych  Altered mental status     GI/Hepatic   Endo/Other  diabetes, Type 2Hypothyroidism  Hyponatremia Obesity   Renal/GU ESRF and DialysisRenal disease     Musculoskeletal  (+) Arthritis , Osteoarthritis,    Abdominal (+) + obese,   Peds  Hematology  (+) anemia ,   Anesthesia Other Findings   Reproductive/Obstetrics                            Anesthesia Physical  Anesthesia Plan  ASA: III  Anesthesia Plan: General   Post-op Pain Management:    Induction: Intravenous  PONV Risk Score and Plan: 3 and Ondansetron, Treatment may vary due to age or medical condition and Dexamethasone  Airway Management Planned: Oral ETT  Additional Equipment: None  Intra-op Plan:   Post-operative Plan: Extubation in OR  Informed Consent: I have reviewed the patients History and Physical, chart, labs and discussed the procedure including the risks, benefits and alternatives for the proposed anesthesia with the patient or authorized representative who has indicated his/her understanding and  acceptance.     Dental advisory given and Consent reviewed with POA  Plan Discussed with: CRNA and Anesthesiologist  Anesthesia Plan Comments: (Consent obtained from son, [name], for anesthesia over telephone)       Anesthesia Quick Evaluation

## 2019-06-23 NOTE — Progress Notes (Signed)
Pt arrived on floor, transferred from PACU. Patient is A&O to self only. Pt states pain is 0/10, and denies nausea. PERRLA, Mucous membranes pink, moist with no signs of breakdown noted. No edema noted in lower extremities. PIV and HD cath site is clean, dry, intact. L arm AV fistula + bruit and thrill. Lumbar area with wound vac set at 125 mmHg, draining serosanguinous, healing pressure injuries to sacral area.   SCD's placed on patient lower extremieties & turned on. Patient orienated to room and unit. Bedside table and personal belongings within reach of patient. Patient educated on usage of nurse call bell, placed within reach of patient. Patient instructed to call for assistance. Patient in bed. Will continue to monitor.   Pt admitted with no belongings at bedside at this time.  Report received from Arnette Norris, PACU RN.

## 2019-06-23 NOTE — Anesthesia Postprocedure Evaluation (Signed)
Anesthesia Post Note  Patient: Bo Merino  Procedure(s) Performed: LUMBAR WOUND DEBRIDEMENT WITH HARDWARE REMOVAL (N/A ) APPLICATION OF WOUND VAC (N/A )     Patient location during evaluation: PACU Anesthesia Type: General Level of consciousness: awake Pain management: pain level controlled Vital Signs Assessment: post-procedure vital signs reviewed and stable Respiratory status: spontaneous breathing, nonlabored ventilation, respiratory function stable and patient connected to nasal cannula oxygen Cardiovascular status: blood pressure returned to baseline and stable Postop Assessment: no apparent nausea or vomiting Anesthetic complications: no    Last Vitals:  Vitals:   06/23/19 1616 06/23/19 1645  BP:  (!) 123/45  Pulse: 64 65  Resp: 17 17  Temp:  36.4 C  SpO2: 97% 98%    Last Pain:  Vitals:   06/23/19 1645  TempSrc: Oral  PainSc: 0-No pain                 Ryan P Ellender

## 2019-06-23 NOTE — Progress Notes (Signed)
  [facility] Progress Note    Assessment/ Plan:   1. ESRD, new this admission.  On THS schedule currently--> has TTS spot [place],  Maturing AVF. Next HD 11/19 2. Fever/ inc WBC: NSG and ID following, will need spinal washout-- rescheduled for  11/19 3. Anemia: IV Fe and ESA, CTM, transfuse prn 4. 2HPTH< stable 5. HTN: stable  6. Dispo: pending spinal washout and HW removal  Subjective:    On HD today, no heparin in preparation for surgery today.  Discussed NPO status with her today.  No complaints, says she feels a lot better.   Objective:   BP (!) 111/59   Pulse 64   Temp 98 F (36.7 C) (Oral)   Resp 13   Ht 5' 2.99" (1.6 m)   Wt 83.7 kg   SpO2 97%   BMI 32.71 kg/m   Intake/Output Summary (Last 24 hours) at 06/23/2019 0858 Last data filed at 06/22/2019 2050 Gross per 24 hour  Intake 600 ml  Output -  Net 600 ml   Weight change: -11.1 kg  Physical Exam: Gen: NAD, lying flat in bed CVS:RRR no m/r/g Resp: clear bilaterally no c/w/r Abd: soft Ext: no LE edema NEURO: AAO x 3 nonfocal no asterixis today ACCESS: R IJ TDC clean/dry/ intact, accessed LUE AVF + T/B Imaging: No results found.  Labs: BMET Recent Labs  Lab 06/18/19 0541 06/21/19 2056 06/23/19 0442  NA 132* 132* 133*  K 4.0 3.6 3.9  CL 93* 96* 98  CO2 23 23 22   GLUCOSE 87 103* 99  BUN 31* 44* 39*  CREATININE 4.90* 6.12* 5.80*  CALCIUM 8.5* 8.2* 8.5*  PHOS  --  3.8  --    CBC Recent Labs  Lab 06/17/19 0502 06/18/19 0541 06/21/19 2056 06/23/19 0442  WBC 16.0* 13.2* 14.7* 14.7*  HGB 8.0* 8.2* 7.6* 8.0*  HCT 27.2* 27.6* 26.0* 27.0*  MCV 88.9 87.1 88.1 87.7  PLT 239 244 239 180    Medications:    . amLODipine  10 mg Oral Daily  . atorvastatin  40 mg Oral q1800  . Chlorhexidine Gluconate Cloth  6 each Topical Q0600  . Chlorhexidine Gluconate Cloth  6 each Topical Q0600  . darbepoetin (ARANESP) injection - DIALYSIS  150 mcg Intravenous Q Sat-HD  . gabapentin   200 mg Oral QHS  . hydrALAZINE  100 mg Oral Q8H  . labetalol  200 mg Oral BID  . levothyroxine  100 mcg Oral QAC breakfast      Madelon Lips, MD 06/23/2019, 8:58 AM

## 2019-06-23 NOTE — Procedures (Signed)
Patient seen and examined on Hemodialysis. BP (!) 111/59   Pulse 64   Temp 98 F (36.7 C) (Oral)   Resp 13   Ht 5' 2.99" (1.6 m)   Wt 83.7 kg   SpO2 97%   BMI 32.71 kg/m    Qb 400 mL/ min via R IJ TDC UF goal 3L.     No complaints.  No heparin in preparation for surg today.  Madelon Lips MD [facility] 8:59 AM

## 2019-06-23 NOTE — Brief Op Note (Signed)
06/23/2019  1:51 PM  PATIENT:  Taylor Tyler  78 y.o. female  PRE-OPERATIVE DIAGNOSIS:  Wound Infection  POST-OPERATIVE DIAGNOSIS:  Wound Infection  PROCEDURE:  Procedure(s): LUMBAR WOUND DEBRIDEMENT WITH HARDWARE REMOVAL (N/A) APPLICATION OF WOUND VAC (N/A)  SURGEON:  Surgeon(s) and Role:    * Consuella Lose, MD - Primary  PHYSICIAN ASSISTANT:   ASSISTANTS: Lavora Brisbon PA-C   ANESTHESIA:   general  EBL:  75 mL   BLOOD ADMINISTERED:none  DRAINS: wound cav   LOCAL MEDICATIONS USED:  NONE  SPECIMEN:  Source of Specimen:  lumbar spine, hardware  DISPOSITION OF SPECIMEN:  gram stain, cultures  COUNTS:  YES  TOURNIQUET:  * No tourniquets in log *  DICTATION: .Note written in [place]: has active order for inpatient  PATIENT DISPOSITION:  PACU - hemodynamically stable.   Delay start of Pharmacological VTE agent (>24hrs) due to surgical blood loss or risk of bleeding: yes

## 2019-06-24 ENCOUNTER — Encounter (HOSPITAL_COMMUNITY): Payer: Self-pay | Admitting: Neurosurgery

## 2019-06-24 LAB — CBC
HCT: 23.5 % — ABNORMAL LOW (ref 36.0–46.0)
Hemoglobin: 6.8 g/dL — CL (ref 12.0–15.0)
MCH: 25.8 pg — ABNORMAL LOW (ref 26.0–34.0)
MCHC: 28.9 g/dL — ABNORMAL LOW (ref 30.0–36.0)
MCV: 89 fL (ref 80.0–100.0)
Platelets: 150 10*3/uL (ref 150–400)
RBC: 2.64 MIL/uL — ABNORMAL LOW (ref 3.87–5.11)
RDW: 16.8 % — ABNORMAL HIGH (ref 11.5–15.5)
WBC: 19 10*3/uL — ABNORMAL HIGH (ref 4.0–10.5)
nRBC: 0.7 % — ABNORMAL HIGH (ref 0.0–0.2)

## 2019-06-24 LAB — PREPARE RBC (CROSSMATCH)

## 2019-06-24 LAB — COMPREHENSIVE METABOLIC PANEL
ALT: 15 U/L (ref 0–44)
AST: 18 U/L (ref 15–41)
Albumin: 2.3 g/dL — ABNORMAL LOW (ref 3.5–5.0)
Alkaline Phosphatase: 65 U/L (ref 38–126)
Anion gap: 16 — ABNORMAL HIGH (ref 5–15)
BUN: 27 mg/dL — ABNORMAL HIGH (ref 8–23)
CO2: 20 mmol/L — ABNORMAL LOW (ref 22–32)
Calcium: 8.5 mg/dL — ABNORMAL LOW (ref 8.9–10.3)
Chloride: 98 mmol/L (ref 98–111)
Creatinine, Ser: 4.3 mg/dL — ABNORMAL HIGH (ref 0.44–1.00)
GFR calc Af Amer: 11 mL/min — ABNORMAL LOW (ref >60)
GFR calc non Af Amer: 9 mL/min — ABNORMAL LOW (ref >60)
Glucose, Bld: 129 mg/dL — ABNORMAL HIGH (ref 70–99)
Potassium: 4 mmol/L (ref 3.5–5.1)
Sodium: 134 mmol/L — ABNORMAL LOW (ref 135–145)
Total Bilirubin: 0.6 mg/dL (ref 0.3–1.2)
Total Protein: 6.9 g/dL (ref 6.5–8.1)

## 2019-06-24 LAB — GLUCOSE, CAPILLARY
Glucose-Capillary: 135 mg/dL — ABNORMAL HIGH (ref 70–99)
Glucose-Capillary: 120 mg/dL — ABNORMAL HIGH (ref 70–99)
Glucose-Capillary: 115 mg/dL — ABNORMAL HIGH (ref 70–99)
Glucose-Capillary: 117 mg/dL — ABNORMAL HIGH (ref 70–99)

## 2019-06-24 LAB — HEMOGLOBIN AND HEMATOCRIT, BLOOD
HCT: 28.2 % — ABNORMAL LOW (ref 36.0–46.0)
Hemoglobin: 8.7 g/dL — ABNORMAL LOW (ref 12.0–15.0)

## 2019-06-24 MED ORDER — SODIUM CHLORIDE 0.9% IV SOLUTION
Freq: Once | INTRAVENOUS | Status: AC
  Administered 2019-06-24: 08:00:00 via INTRAVENOUS

## 2019-06-24 NOTE — Op Note (Signed)
  NEUROSURGERY OPERATIVE NOTE   PREOP DIAGNOSIS:  1. 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 Exploration of lumbar wound 2. Sharp debridement of lumbar wound 3. Removal of previously implanted lumbar hardware 4. Application of wound VAC for closure  SURGEON: Dr. Consuella Lose, MD  ASSISTANT: Primus Bravo, PA-C  ANESTHESIA: General Endotracheal  EBL: 100 cc  SPECIMENS: Deep wound culture swabs for aerobic and anaerobic culture  DRAINS: Wound VAC  COMPLICATIONS: None immediate  CONDITION: Hemodynamically stable to postanesthesia care unit  HISTORY: Taylor Tyler is a 78 y.o. female who initially underwent lumbar decompression and fusion approximately 2 and half years ago.  She has had a chronic nonhealing lumbar wound with some drainage since this time.  She has undergone multiple prior wound debridement and 2 prior wound VAC placements with continued nonhealing wound and chronic infection.  She is recently admitted for CHF exacerbation and was found to have continued drainage from her wound.  Discussion with infectious disease suggested the possibility of infected hardware as the underlying source of infection.  CT scan suggested good fusion at the previously operated levels.  Further lumbar wound debridement and removal of hardware with wound VAC application was therefore suggested.  The risks and benefits of the surgery were reviewed in detail with the patient.  After all her questions were answered informed consent was obtained and witnessed.  PROCEDURE IN DETAIL: The patient was brought to the operating room. After induction of general anesthesia, the patient was positioned on the operative table in the prone position. All pressure points were meticulously padded.  Previous skin incision was then marked out and prepped and draped in the usual sterile fashion.  After timeout was conducted, the previous incision was  opened.  The superficial tissue appeared to be healthy, with a very small sinus tract identified from the skin leading through the subcutaneous tissue and down into the deeper portion of the wound.  The wound was therefore further opened until a large pocket was identified with copious amount of purulent material.  Culture swabs were taken of this material and sent to pathology.  At this point, the previous hardware was identified including a cross-link, bilateral pedicle screws at L3, L4, L5, and S1, and the bilateral rods.  These were sequentially removed.  Of note, the bilateral L3 screws were noted to be very loose, without any real bony purchase.  The L4 and L5 screws appear to be well secured.  When I remove the left S1 screw we immediately encountered a copious amount of purulent material emanating from the sacrum suggesting chronic infection around this screw.  At this point, the wound was irrigated with approximately 3 L of normal saline irrigation.  The wound was debrided of any clearly devitalized tissue using Bovie electrocautery and sharp rongeurs.  The deep layer was then reapproximated using 1 Vicryl stitches.  Foam for the wound VAC was then cut to the appropriate size and placed in the wound.  Overlying adherent dressing was then placed and the VAC was connected.  A good seal was achieved.  The patient was then transferred to the stretcher, extubated, and taken to the postanesthesia care unit in stable hemodynamic condition.  At the end of the case all sponge, needle, instrument, and cottonoid counts were correct.

## 2019-06-24 NOTE — Progress Notes (Addendum)
  [name] Progress Note    Assessment/ Plan:   1. ESRD, new this admission.  On THS schedule currently--> has TTS spot [place],  Maturing AVF. Next HD 11/21 2. Fever/ inc WBC: NSG and ID following, will need spinal washout-- had it 11/19, wound vac in place, on cefepime and cultures pending 3. Anemia: IV Fe and ESA, CTM, transfuse prn 4. 2HPTH< stable 5. HTN: stable  6. Dispo: pending.  From a renal perspective can go anytime.    Subjective:     Seen in room.  S/p vertebral washout yesterday.  Hgb 6.8 this AM.   Objective:   BP (!) 169/49 (BP Location: Right Arm)   Pulse 76   Temp 98.3 F (36.8 C) (Oral)   Resp 19   Ht 5\' 3"  (1.6 m)   Wt 95 kg   SpO2 100%   BMI 37.10 kg/m   Intake/Output Summary (Last 24 hours) at 06/24/2019 1354 Last data filed at 06/24/2019 1135 Gross per 24 hour  Intake 475 ml  Output 175 ml  Net 300 ml   Weight change: -0.004 kg  Physical Exam: Gen: NAD, lying flat in bed CVS:RRR no m/r/g Resp: clear bilaterally no c/w/r Abd: soft Ext: no LE edema NEURO: AAO x 3 nonfocal no asterixis today ACCESS: R IJ TDC clean/dry/ intact, accessed LUE AVF + T/B Imaging: No results found.  Labs: BMET Recent Labs  Lab 06/18/19 0541 06/21/19 2056 06/23/19 0442 06/23/19 1201 06/24/19 0619  NA 132* 132* 133* 135 134*  K 4.0 3.6 3.9 3.5 4.0  CL 93* 96* 98 97* 98  CO2 23 23 22   --  20*  GLUCOSE 87 103* 99 128* 129*  BUN 31* 44* 39* 14 27*  CREATININE 4.90* 6.12* 5.80* 2.60* 4.30*  CALCIUM 8.5* 8.2* 8.5*  --  8.5*  PHOS  --  3.8  --   --   --    CBC Recent Labs  Lab 06/18/19 0541 06/21/19 2056 06/23/19 0442 06/23/19 1201 06/24/19 0619  WBC 13.2* 14.7* 14.7*  --  19.0*  HGB 8.2* 7.6* 8.0* 10.9* 6.8*  HCT 27.6* 26.0* 27.0* 32.0* 23.5*  MCV 87.1 88.1 87.7  --  89.0  PLT 244 239 180  --  150    Medications:    . amLODipine  10 mg Oral Daily  . atorvastatin  40 mg Oral q1800  . Chlorhexidine Gluconate Cloth  6  each Topical Q0600  . Chlorhexidine Gluconate Cloth  6 each Topical Q0600  . darbepoetin (ARANESP) injection - DIALYSIS  150 mcg Intravenous Q Sat-HD  . gabapentin  200 mg Oral QHS  . hydrALAZINE  100 mg Oral Q8H  . labetalol  200 mg Oral BID  . levothyroxine  100 mcg Oral QAC breakfast      Madelon Lips, MD 06/24/2019, 1:54 PM

## 2019-06-24 NOTE — Progress Notes (Signed)
Physical Therapy Treatment Patient Details Name: Taylor Tyler MRN: [id number] DOB: 10/26/1940 Today's Date: 06/24/2019    History of Present Illness Pt is a 78 y/o female presenting after fall at home. Vented and found "Low probability for acute pulmonary embolism (10-19%)" and acute on chronic diastolic heart failure. Pt s/p hardware removal lumbar spine 06/23/19 with wound vac placement. PMH of DM2, HTN, CKD stage 3, anemia, non healing wound of low back after sx    PT Comments    Patient s/p lumbar spine debridement, hardware removal and application of wound vac on 11/19. Noted to have increased confusion today and cognitive deficits relating to orientation, awareness, attention, problem solving and following commands. Pt requires Mod A for standing from chair x2 with bil knee instability. Difficulty following multi step commands without repetition and increased time. Perseverating on hearing her doctor knock on the door. Oriented to self only. Ambulation limited due to fatigue, weakness and cognition. POC updated and goals continue to be appropriate. Continue to recommend SNF to maximize independence and mobilty prior to return home.   Follow Up Recommendations  SNF;Supervision for mobility/OOB     Equipment Recommendations  None recommended by PT    Recommendations for Other Services       Precautions / Restrictions Precautions Precautions: Fall Precaution Comments: wound vac Restrictions Weight Bearing Restrictions: No    Mobility  Bed Mobility Overal bed mobility: Needs Assistance Bed Mobility: Sit to Sidelying Rolling: Mod assist Sidelying to sit: Max assist   Sit to supine: Min guard   General bed mobility comments: Able to bring LEs into bed to return to sidelying with HOB flat.  Transfers Overall transfer level: Needs assistance Equipment used: Rolling walker (2 wheeled) Transfers: Sit to/from Stand Sit to Stand: Mod assist Stand pivot transfers: Min assist       General transfer comment: Assist to power to standing from chair x2; bil knee instability in standing with pt lowering self down onto chair.  Ambulation/Gait Ambulation/Gait assistance: Min assist Gait Distance (Feet): 4 Feet Assistive device: Rolling walker (2 wheeled) Gait Pattern/deviations: Step-to pattern;Shuffle;Trunk flexed Gait velocity: Decreased   General Gait Details: Short, shuffling steps from chair to bed with max directional cues, requiring intermittent minA to maintain standing due to knee instability and poor cognition.   Stairs             Wheelchair Mobility    Modified Rankin (Stroke Patients Only)       Balance Overall balance assessment: Needs assistance Sitting-balance support: Feet supported;No upper extremity supported Sitting balance-Leahy Scale: Fair Sitting balance - Comments: Limited sitting tolerance without back support   Standing balance support: During functional activity;Bilateral upper extremity supported Standing balance-Leahy Scale: Poor Standing balance comment: BUE support with RW and physical A for dynamic activities.                            Cognition Arousal/Alertness: Lethargic Behavior During Therapy: Flat affect Overall Cognitive Status: Impaired/Different from baseline Area of Impairment: Orientation;Attention;Memory;Following commands;Safety/judgement;Awareness;Problem solving                 Orientation Level: Disoriented to;Place;Time;Situation Current Attention Level: Sustained Memory: Decreased recall of precautions;Decreased short-term memory Following Commands: Follows one step commands with increased time;Follows one step commands inconsistently Safety/Judgement: Decreased awareness of safety;Decreased awareness of deficits Awareness: Intellectual Problem Solving: Slow processing;Difficulty sequencing;Requires verbal cues General Comments: Following simple commands inconsistently with  repetition and increased time; easily  distracted. Perseverating on if her doctor was knocking on the door; Mumbling at times, incoherent words.      Exercises      General Comments General comments (skin integrity, edema, etc.): RN aware of "loopy" behavior and pt not acting like herself.      Pertinent Vitals/Pain Pain Assessment: Faces Faces Pain Scale: Hurts little more Pain Location: Lower back Pain Descriptors / Indicators: Discomfort;Grimacing Pain Intervention(s): Monitored during session    Home Living Family/patient expects to be discharged to:: Private residence Living Arrangements: Alone Available Help at Discharge: Family;Neighbor;Available PRN/intermittently Type of Home: House Home Access: Stairs to enter Entrance Stairs-Rails: None Home Layout: One level Home Equipment: Environmental consultant - 2 wheels;Cane - single point Additional Comments: patient basin bathing only; info taken from previous eval as pt is not oriented.    Prior Function Level of Independence: Needs assistance  Gait / Transfers Assistance Needed: ambulatory ADL's / Homemaking Assistance Needed: Performing own ADL     PT Goals (current goals can now be found in the care plan section) Acute Rehab PT Goals Patient Stated Goal: pt not able to state PT Goal Formulation: With patient Time For Goal Achievement: 07/08/19 Potential to Achieve Goals: Good Progress towards PT goals: Not progressing toward goals - comment(cognition/fatigue)    Frequency    Min 2X/week      PT Plan Current plan remains appropriate    Co-evaluation              AM-PAC PT "6 Clicks" Mobility   Outcome Measure  Help needed turning from your back to your side while in a flat bed without using bedrails?: A Little Help needed moving from lying on your back to sitting on the side of a flat bed without using bedrails?: A Lot Help needed moving to and from a bed to a chair (including a wheelchair)?: A Lot Help needed  standing up from a chair using your arms (e.g., wheelchair or bedside chair)?: A Lot Help needed to walk in hospital room?: A Lot Help needed climbing 3-5 steps with a railing? : Total 6 Click Score: 12    End of Session Equipment Utilized During Treatment: Gait belt Activity Tolerance: Patient limited by fatigue;Patient limited by lethargy(cognition) Patient left: in bed;with call bell/phone within reach;with bed alarm set Nurse Communication: Mobility status PT Visit Diagnosis: Other abnormalities of gait and mobility (R26.89)     Time: 1410-1430 PT Time Calculation (min) (ACUTE ONLY): 20 min  Charges:  1 PT Re-evaluation                      Zettie Cooley, DPT Acute Rehabilitation Services Pager [phone] Office [place] 06/24/2019, 3:18 PM

## 2019-06-24 NOTE — Progress Notes (Signed)
PT Cancellation Note  Patient Details Name: RAI SINAGRA MRN: [id number] DOB: [birth year]   Cancelled Treatment:    Reason Eval/Treat Not Completed: Medical issues which prohibited therapy. 6.8 hgb. Pt scheduled to receive 1 unit PRBC. PT to re-attempt as time allows.    Lorriane Shire 06/24/2019, 9:13 AM   Lorrin Goodell, PT  Office # [phone] Pager [phone]

## 2019-06-24 NOTE — Evaluation (Signed)
Occupational Therapy Evaluation Patient Details Name: Taylor Tyler MRN: [id number] DOB: 10-26-1940 Today's Date: 06/24/2019    History of Present Illness Pt is a 78 y/o female presenting after fall at home.  Vented and found"Low probability for acute pulmonary embolism (10-19%)" and acute on chronic diastolic heart failure. Pt s/p harware removal lumbar spine 06/23/19 with wound vac placement. with PMH of DM2, HTN, CKD stage 3, anemia, non healing wound of low back after sx   Clinical Impression   Pt PTA: Pt living alone per chart. Pt currently limited by poor mentation, decreased ability to care for self, decreased strength and increased pain. Pt follows simple commands, but unable to stay oriented or sustain attention for any cognitive assessment. Pt MinA for stand pivot to recliner taking only a few steps. Pt minA for power up and close guard for stability with RW to recliner. ModA for bed mobility for log roll. Pt remains minA for UB ADL and maxA for LB ADL.  Pt would benefit from continued OT skilled services for ADL, mobility and safety in SNF setting. OT following acutely.     Follow Up Recommendations  SNF;Supervision/Assistance - 24 hour    Equipment Recommendations  None recommended by OT    Recommendations for Other Services       Precautions / Restrictions Precautions Precautions: Fall;Other (comment) Precaution Comments: wound vac Restrictions Weight Bearing Restrictions: No      Mobility Bed Mobility Overal bed mobility: Needs Assistance Bed Mobility: Rolling;Sidelying to Sit Rolling: Mod assist Sidelying to sit: Max assist       General bed mobility comments: Cues for log roll to bend knees  Transfers Overall transfer level: Needs assistance Equipment used: Rolling walker (2 wheeled) Transfers: Sit to/from Stand Sit to Stand: Mod assist Stand pivot transfers: Min assist       General transfer comment: Pt limited by poor cognition and decreased safety  awareness. pt automatically using RW for stability. No knee buckling noted for transfer.    Balance Overall balance assessment: Needs assistance Sitting-balance support: Bilateral upper extremity supported;Feet unsupported Sitting balance-Leahy Scale: Fair     Standing balance support: Bilateral upper extremity supported Standing balance-Leahy Scale: Poor Standing balance comment: BUE support with RW; cues for standing upright for stand pivot                           ADL either performed or assessed with clinical judgement   ADL Overall ADL's : Needs assistance/impaired Eating/Feeding: Supervision/ safety;Set up;Sitting   Grooming: Minimal assistance;Standing;Cueing for safety;Cueing for sequencing   Upper Body Bathing: Minimal assistance;Standing;Cueing for safety   Lower Body Bathing: Maximal assistance;Sitting/lateral leans;Sit to/from stand;Cueing for safety   Upper Body Dressing : Minimal assistance;Sitting   Lower Body Dressing: Maximal assistance;Cueing for safety;Cueing for sequencing;Sitting/lateral leans;Sit to/from stand   Toilet Transfer: Minimal assistance;Stand-pivot;BSC   Toileting- Clothing Manipulation and Hygiene: Maximal assistance;Cueing for safety;Cueing for sequencing;Sitting/lateral lean;Sit to/from stand       Functional mobility during ADLs: Moderate assistance;Rolling walker;Cueing for safety;Cueing for sequencing General ADL Comments: Pt limited by poor mentation, decreased ability to care for self, decreased strength and increased pain.     Vision Baseline Vision/History: No visual deficits Vision Assessment?: No apparent visual deficits     Perception     Praxis      Pertinent Vitals/Pain Pain Assessment: Faces Faces Pain Scale: Hurts little more Pain Location: Lower back Pain Descriptors / Indicators: Discomfort Pain Intervention(s): Monitored  during session     Hand Dominance Right   Extremity/Trunk Assessment Upper  Extremity Assessment Upper Extremity Assessment: Generalized weakness   Lower Extremity Assessment Lower Extremity Assessment: Generalized weakness;Defer to PT evaluation   Cervical / Trunk Assessment Cervical / Trunk Assessment: Other exceptions Cervical / Trunk Exceptions: s/p lumbar hardware removal   Communication Communication Communication: No difficulties   Cognition Arousal/Alertness: Lethargic Behavior During Therapy: Flat affect Overall Cognitive Status: Impaired/Different from baseline Area of Impairment: Orientation;Attention;Memory;Following commands;Safety/judgement;Awareness;Problem solving                 Orientation Level: Disoriented to;Place;Time;Situation Current Attention Level: Sustained Memory: Decreased recall of precautions;Decreased short-term memory Following Commands: Follows one step commands with increased time Safety/Judgement: Decreased awareness of safety;Decreased awareness of deficits Awareness: Intellectual Problem Solving: Slow processing;Difficulty sequencing;Requires verbal cues General Comments: Pt following simple commands; unable to state birthday year and not able to state location. Pt mumbling about nonsense and wanting to looking out of the window.   General Comments  RN aware of "loopy" behavior and pt not acting like herself.    Exercises     Shoulder Instructions      Home Living Family/patient expects to be discharged to:: Private residence Living Arrangements: Alone Available Help at Discharge: Family;Neighbor;Available PRN/intermittently Type of Home: House Home Access: Stairs to enter [employer] of Steps: 1 Entrance Stairs-Rails: None Home Layout: One level     Bathroom Shower/Tub: Teacher, early years/pre: Handicapped height     Home Equipment: Environmental consultant - 2 wheels;Cane - single point   Additional Comments: patient basin bathing only; info taken from previous eval as pt is not oriented.       Prior Functioning/Environment Level of Independence: Needs assistance  Gait / Transfers Assistance Needed: ambulatory ADL's / Homemaking Assistance Needed: Performing own ADL            OT Problem List: Decreased strength;Decreased activity tolerance;Impaired balance (sitting and/or standing);Decreased safety awareness;Decreased knowledge of use of DME or AE;Pain      OT Treatment/Interventions: Self-care/ADL training;Therapeutic exercise;Energy conservation;DME and/or AE instruction;Therapeutic activities;Patient/family education;Balance training    OT Goals(Current goals can be found in the care plan section) Acute Rehab OT Goals Patient Stated Goal: To get home OT Goal Formulation: With patient Time For Goal Achievement: 07/08/19 Potential to Achieve Goals: Fair  OT Frequency: Min 2X/week   Barriers to D/C: Decreased caregiver support  pt lives alone       Co-evaluation              AM-PAC OT "6 Clicks" Daily Activity     Outcome Measure Help from another person eating meals?: A Little Help from another person taking care of personal grooming?: A Lot Help from another person toileting, which includes using toliet, bedpan, or urinal?: A Lot Help from another person bathing (including washing, rinsing, drying)?: A Lot Help from another person to put on and taking off regular upper body clothing?: A Little Help from another person to put on and taking off regular lower body clothing?: A Lot 6 Click Score: 14   End of Session Equipment Utilized During Treatment: Gait belt;Rolling walker Nurse Communication: Mobility status  Activity Tolerance: Patient limited by lethargy;Patient tolerated treatment well Patient left: in chair;with call bell/phone within reach;with chair alarm set  OT Visit Diagnosis: Unsteadiness on feet (R26.81);Other symptoms and signs involving cognitive function;Muscle weakness (generalized) (M62.81)                Time:  4465-2076 OT Time  Calculation (min): 26 min Charges:  OT General Charges $OT Visit: 1 Visit OT Evaluation $OT Eval Moderate Complexity: 1 Mod OT Treatments $Self Care/Home Management : 8-22 mins  Darryl Nestle) Marsa Aris OTR/L Acute Rehabilitation Services Pager: [phone] Office: [place] 06/24/2019, 1:42 PM

## 2019-06-24 NOTE — Progress Notes (Signed)
PROGRESS NOTE    Taylor Tyler  KAJ:[id number] DOB: May 04, 1941 DOA: 05/30/2019 PCP: McLean-Scocuzza, Nino Glow, MD    Brief Narrative:  78 yo with diabetes mellitus type 2 presently not on medication, chronic diastolic hypertension, chronic kidney disease stage III, anemia, hypothyroidism, hyperlipidemia.   She presented to the ED on 10/26 for dyspnea after a fall out of bed the night before and being on the floor for about 8 hrs. Her son found her in the AM. She was short of breath for at least 2 days.   In ED, noted to be wheezing. Suspected to have acute diastolic CHF and started on Lasix.  Also noted to have AKI with Cr of 5.74 and K of 5.3.  Past Cr 2.64.  As it was felt that her renal failure was permanent, she was eventually started on dialysis. It was planned to d/c her to SNF however, course complicated by leukocytosis and fevers on 11/11. See below  She was transferred to [facility] on 11/11.  Assessment & Plan:   Principal Problem:   Acute CHF (congestive heart failure) (HCC) Active Problems:   Hypothyroidism   DM (diabetes mellitus), type 2 with renal complications (HCC)   Anemia   Acute renal failure superimposed on stage 3 chronic kidney disease (HCC)   Essential hypertension   Pressure injury of skin   Fever  Principal Problem:  Acute diastolic CHF (congestive heart failure)   Acute on chronic kidney disease stage 4 >> ESRD Metabolic acidosis - despite medical management, she did not diurese well and her Cr continued to rise - 11/4 - tunneled dialysis cath placed and underwent dialysis - Pt is anticipated to be arranged for long-term HD - an outpt dialysis spot has been found- her schedule will be Tu/Th/Sat -Nephrology following. Per Nephrology can be d/c anytime from Nephrology standpoint  Active Problems: Leukocytosis, fever > Sepsis from infected back wound - temp spike 103 on evening of 11/11 - COVID test done for a planned d/c to SNF on 11/11 was negative  (prior to temp spike) - blood cultures negative  - UA shows rare bacteria and > 50 wbc with yeast - Urine culture shows > 100 K yeast (Not UTI per ID)  - source likely a very small draining wound on lumbar spine related to lumbar fusion surgery in 2018 which has never healed- she has had an infection in the past s/p I and D and wound vac 11/19. Wound appears re-infected - Neurosurgery consulted on 11/12 - ID consulted 11/12 and assisting with antibiotics - Initially continued on Vanc and Cefepime being given with dialysis - Neurosurgery following and pt now s/p lumbar wound debridement with hardware removal -Wound culture pos for gm neg rods. ID recommending cefepime monotherapy for at least 6 weeks therapy  Acute toxic metabolic encephalopathy with underlying dementia - Family has noted memory loss for about 4-5 yrs- she has had delulsions and paranoia for about 2 years - Remains stable this AM, although seems somewhat confused   Hypothyroidism  - cont Synthroid as pt tolerates  DM ruled out - last A1c was 5.2- not on medication at home - sugars stable and controlled  AOCD - appreciate management per renal  - Remains stable at this time  HTN - on Norvasc, Labetalol, Hydralazine - BP remains stable  HLD - Continued on Lipitor - Remains stable  Anemia of chronic disease - pt with hgb down to 6.8  Decubitus ulcers  Pressure Injury 06/06/19 Sacrum Left Stage  I -  Intact skin with non-blanchable redness of a localized area usually over a bony prominence. (Active)  06/06/19 1010  Location: Sacrum  Location Orientation: Left  Staging: Stage I -  Intact skin with non-blanchable redness of a localized area usually over a bony prominence.  Wound Description (Comments):   Present on Admission: No     Pressure Injury 06/12/19 Sacrum Left Stage II -  Partial thickness loss of dermis presenting as a shallow open ulcer with a red, pink wound bed without slough. Pink (Active)   06/12/19 2115  Location: Sacrum  Location Orientation: Left  Staging: Stage II -  Partial thickness loss of dermis presenting as a shallow open ulcer with a red, pink wound bed without slough.  Wound Description (Comments): Pink  Present on Admission:      Pressure Injury 06/15/19 Buttocks Left Stage II -  Partial thickness loss of dermis presenting as a shallow open ulcer with a red, pink wound bed without slough. 100 % pinkk (Active)  06/15/19 1102  Location: Buttocks  Location Orientation: Left  Staging: Stage II -  Partial thickness loss of dermis presenting as a shallow open ulcer with a red, pink wound bed without slough.  Wound Description (Comments): 100 % pinkk  Present on Admission: No   DVT prophylaxis: SCD's Code Status: Full Family Communication: Pt in room, family not at bedside Disposition Plan: Uncertain at this time  Consultants:   Nephrology  Vascular surgery  NS  ID  Procedures:   11/4 tunneled cath  11/19 Lumbar wound debridement and hardware removal  Antimicrobials: Anti-infectives (From admission, onward)   Start     Dose/Rate Route Frequency Ordered Stop   06/23/19 1315  ceFEPIme (MAXIPIME) 2 g in sodium chloride 0.9 % 100 mL IVPB  Status:  Discontinued     2 g 200 mL/hr over 30 Minutes Intravenous To Surgery 06/23/19 1301 06/23/19 1624   06/23/19 1200  bacitracin 50,000 Units in sodium chloride 0.9 % 500 mL irrigation  Status:  Discontinued       As needed 06/23/19 1203 06/23/19 1359   06/16/19 1800  ceFEPIme (MAXIPIME) 2 g in sodium chloride 0.9 % 100 mL IVPB     2 g 200 mL/hr over 30 Minutes Intravenous Once per day on Tue Thu Sat 06/15/19 1904     06/16/19 1200  vancomycin (VANCOCIN) IVPB 1000 mg/200 mL premix  Status:  Discontinued     1,000 mg 200 mL/hr over 60 Minutes Intravenous Every T-Th-Sa (Hemodialysis) 06/15/19 1904 06/24/19 1209   06/15/19 2030  vancomycin (VANCOCIN) 2,000 mg in sodium chloride 0.9 % 500 mL IVPB     2,000 mg  250 mL/hr over 120 Minutes Intravenous  Once 06/15/19 1902 06/16/19 0010   06/15/19 2000  ceFEPIme (MAXIPIME) 2 g in sodium chloride 0.9 % 100 mL IVPB     2 g 200 mL/hr over 30 Minutes Intravenous  Once 06/15/19 1902 06/15/19 2028   06/08/19 0600  cefUROXime (ZINACEF) 1.5 g in sodium chloride 0.9 % 100 mL IVPB     1.5 g 200 mL/hr over 30 Minutes Intravenous On call to O.R. 06/07/19 1519 06/08/19 1328      Subjective: Without complaints at this time  Objective: Vitals:   06/24/19 0859 06/24/19 1140 06/24/19 1420 06/24/19 1559  BP: (!) 163/53 (!) 169/49 (!) 151/56 (!) 146/55  Pulse: 71 76  73  Resp: 17 19  17   Temp: 98 F (36.7 C) 98.3 F (36.8 C)  98.2 F (36.8 C)  TempSrc: Oral Oral  Oral  SpO2: 100% 100%  100%  Weight:      Height:        Intake/Output Summary (Last 24 hours) at 06/24/2019 1627 Last data filed at 06/24/2019 1230 Gross per 24 hour  Intake 555 ml  Output 75 ml  Net 480 ml   Filed Weights   06/23/19 0700 06/23/19 1109 06/24/19 0500  Weight: 83.7 kg 83.7 kg 95 kg    Examination: General exam: Awake, laying in bed, in nad Respiratory system: Normal respiratory effort, no wheezing Cardiovascular system: regular rate, s1, s2 Gastrointestinal system: Soft, nondistended, positive BS Central nervous system: CN2-12 grossly intact, strength intact Extremities: Perfused, no clubbing Skin: Normal skin turgor, no notable skin lesions seen Psychiatry: Mood normal // no visual hallucinations   Data Reviewed: I have personally reviewed following labs and imaging studies  CBC: Recent Labs  Lab 06/18/19 0541 06/21/19 2056 06/23/19 0442 06/23/19 1201 06/24/19 0619 06/24/19 1453  WBC 13.2* 14.7* 14.7*  --  19.0*  --   HGB 8.2* 7.6* 8.0* 10.9* 6.8* 8.7*  HCT 27.6* 26.0* 27.0* 32.0* 23.5* 28.2*  MCV 87.1 88.1 87.7  --  89.0  --   PLT 244 239 180  --  150  --    Basic Metabolic Panel: Recent Labs  Lab 06/18/19 0541 06/21/19 2056 06/23/19 0442  06/23/19 1201 06/24/19 0619  NA 132* 132* 133* 135 134*  K 4.0 3.6 3.9 3.5 4.0  CL 93* 96* 98 97* 98  CO2 23 23 22   --  20*  GLUCOSE 87 103* 99 128* 129*  BUN 31* 44* 39* 14 27*  CREATININE 4.90* 6.12* 5.80* 2.60* 4.30*  CALCIUM 8.5* 8.2* 8.5*  --  8.5*  PHOS  --  3.8  --   --   --    GFR: Estimated Creatinine Clearance: 12.2 mL/min (A) (by C-G formula based on SCr of 4.3 mg/dL (H)). Liver Function Tests: Recent Labs  Lab 06/21/19 2056 06/24/19 0619  AST  --  18  ALT  --  15  ALKPHOS  --  65  BILITOT  --  0.6  PROT  --  6.9  ALBUMIN 1.7* 2.3*   No results for input(s): LIPASE, AMYLASE in the last 168 hours. No results for input(s): AMMONIA in the last 168 hours. Coagulation Profile: No results for input(s): INR, PROTIME in the last 168 hours. Cardiac Enzymes: No results for input(s): CKTOTAL, CKMB, CKMBINDEX, TROPONINI in the last 168 hours. BNP (last 3 results) No results for input(s): PROBNP in the last 8760 hours. HbA1C: No results for input(s): HGBA1C in the last 72 hours. CBG: Recent Labs  Lab 06/23/19 1802 06/23/19 2108 06/24/19 0718 06/24/19 1136 06/24/19 1557  GLUCAP 144* 140* 135* 120* 115*   Lipid Profile: No results for input(s): CHOL, HDL, LDLCALC, TRIG, CHOLHDL, LDLDIRECT in the last 72 hours. Thyroid Function Tests: No results for input(s): TSH, T4TOTAL, FREET4, T3FREE, THYROIDAB in the last 72 hours. Anemia Panel: No results for input(s): VITAMINB12, FOLATE, FERRITIN, TIBC, IRON, RETICCTPCT in the last 72 hours. Sepsis Labs: No results for input(s): PROCALCITON, LATICACIDVEN in the last 168 hours.  Recent Results (from the past 240 hour(s))  SARS CORONAVIRUS 2 (TAT 6-24 HRS) Nasopharyngeal Nasopharyngeal Swab     Status: None   Collection Time: 06/15/19 11:20 AM   Specimen: Nasopharyngeal Swab  Result Value Ref Range Status   SARS Coronavirus 2 NEGATIVE NEGATIVE Final    Comment: (  NOTE) SARS-CoV-2 target nucleic acids are NOT DETECTED.  The SARS-CoV-2 RNA is generally detectable in upper and lower respiratory specimens during the acute phase of infection. Negative results do not preclude SARS-CoV-2 infection, do not rule out co-infections with other pathogens, and should not be used as the sole basis for treatment or other patient management decisions. Negative results must be combined with clinical observations, patient history, and epidemiological information. The expected result is Negative. Fact Sheet for Patients: SugarRoll.be Fact Sheet for Healthcare Providers: https://www.woods-mathews.com/ This test is not yet approved or cleared by the [place] FDA and  has been authorized for detection and/or diagnosis of SARS-CoV-2 by FDA under an Emergency Use Authorization (EUA). This EUA will remain  in effect (meaning this test can be used) for the duration of the COVID-19 declaration under Section 56 4(b)(1) of the Act, 21 U.S.C. section 360bbb-3(b)(1), unless the authorization is terminated or revoked sooner. Performed at [facility], [place] [address] [place], [place] [address]   Culture, blood (routine x 2)     Status: None   Collection Time: 06/15/19 12:45 PM   Specimen: BLOOD  Result Value Ref Range Status   Specimen Description BLOOD RIGHT ANTECUBITAL  Final   Special Requests   Final    BOTTLES DRAWN AEROBIC ONLY Blood Culture results may not be optimal due to an inadequate volume of blood received in culture bottles   Culture   Final    NO GROWTH 5 DAYS Performed at [facility], [place] [address], [place], [place] [address]    Report Status 06/20/2019 FINAL  Final  Culture, blood (routine x 2)     Status: None   Collection Time: 06/15/19 12:49 PM   Specimen: BLOOD  Result Value Ref Range Status   Specimen Description BLOOD RIGHT ANTECUBITAL  Final   Special Requests   Final    BOTTLES DRAWN AEROBIC ONLY Blood Culture results may not be optimal  due to an inadequate volume of blood received in culture bottles   Culture   Final    NO GROWTH 5 DAYS Performed at [facility], [place] [address] [place], [place] [address]    Report Status 06/20/2019 FINAL  Final  Culture, Urine     Status: Abnormal   Collection Time: 06/15/19  6:49 PM   Specimen: Urine, Random  Result Value Ref Range Status   Specimen Description URINE, RANDOM  Final   Special Requests   Final    NONE Performed at [facility], [place] [address] [place], [place] [address]    Culture >=100,000 COLONIES/mL YEAST (A)  Final   Report Status 06/17/2019 FINAL  Final  SARS CORONAVIRUS 2 (TAT 6-24 HRS)     Status: None   Collection Time: 06/23/19  5:40 AM  Result Value Ref Range Status   SARS Coronavirus 2 NEGATIVE NEGATIVE Final    Comment: (NOTE) SARS-CoV-2 target nucleic acids are NOT DETECTED. The SARS-CoV-2 RNA is generally detectable in upper and lower respiratory specimens during the acute phase of infection. Negative results do not preclude SARS-CoV-2 infection, do not rule out co-infections with other pathogens, and should not be used as the sole basis for treatment or other patient management decisions. Negative results must be combined with clinical observations, patient history, and epidemiological information. The expected result is Negative. Fact Sheet for Patients: SugarRoll.be Fact Sheet for Healthcare Providers: https://www.woods-mathews.com/ This test is not yet approved or cleared by the [place] FDA and  has been authorized for detection and/or diagnosis of SARS-CoV-2 by FDA under an Emergency Use Authorization (EUA). This EUA will remain  in effect (meaning this test can be used) for the duration of the COVID-19 declaration under Section 56 4(b)(1) of the Act, 21 U.S.C. section 360bbb-3(b)(1), unless the authorization is terminated or revoked sooner. Performed at [facility],  [place] [address], [place], [place] [address]   Aerobic/Anaerobic Culture (surgical/deep wound)     Status: None (Preliminary result)   Collection Time: 06/23/19  1:02 PM   Specimen: Soft Tissue, Other  Result Value Ref Range Status   Specimen Description TISSUE LUMBAR  Final   Special Requests PATIENT ON FOLLOWING VANC  Final   Gram Stain   Final    MODERATE WBC PRESENT,BOTH PMN AND MONONUCLEAR NO ORGANISMS SEEN    Culture   Final    RARE PROTEUS MIRABILIS CRITICAL RESULT CALLED TO, READ BACK BY AND VERIFIED WITH: H. STONE, RN AT 1055 ON 06/24/19 BY C. JESSUP, MT. SUSCEPTIBILITIES TO FOLLOW Performed at [facility], [place] [address], [place], [place] [address]    Report Status PENDING  Incomplete     Radiology Studies: No results found.  Scheduled Meds: . amLODipine  10 mg Oral Daily  . atorvastatin  40 mg Oral q1800  . Chlorhexidine Gluconate Cloth  6 each Topical Q0600  . Chlorhexidine Gluconate Cloth  6 each Topical Q0600  . darbepoetin (ARANESP) injection - DIALYSIS  150 mcg Intravenous Q Sat-HD  . gabapentin  200 mg Oral QHS  . hydrALAZINE  100 mg Oral Q8H  . labetalol  200 mg Oral BID  . levothyroxine  100 mcg Oral QAC breakfast   Continuous Infusions: . sodium chloride 10 mL/hr at 06/18/19 1746  . sodium chloride 10 mL/hr at 06/23/19 1205  . ceFEPime (MAXIPIME) IV 2 g (06/22/19 0129)     LOS: 25 days   Marylu Lund, MD [facility] Pager On Amion  If 7PM-7AM, please contact night-coverage 06/24/2019, 4:27 PM

## 2019-06-24 NOTE — Progress Notes (Signed)
Pharmacy Antibiotic Note  Taylor Tyler is a 78 y.o. female admitted on 05/30/2019 due to a fall at home and shortness of breath. 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  Now s/p hardware removal/debridement, Cx obtained 11/19.  Last HD yesterday, on schedule.    Plan: Cefepime 2g IV qHD TTS Vancomycin 1g IV qHD TTS F/u Cx, ID recs and ability to narrow Vancomycin random level tomorrow before HD  Height: 5\' 3"  (160 cm) Weight: 209 lb 7 oz (95 kg) IBW/kg (Calculated) : 52.4  Temp (24hrs), Avg:97.8 F (36.6 C), Min:97.3 F (36.3 C), Max:98.4 F (36.9 C)  Recent Labs  Lab 06/18/19 0541 06/21/19 2056 06/23/19 0442 06/23/19 1201 06/24/19 0619  WBC 13.2* 14.7* 14.7*  --  19.0*  CREATININE 4.90* 6.12* 5.80* 2.60* 4.30*    Estimated Creatinine Clearance: 12.2 mL/min (A) (by C-G formula based on SCr of 4.3 mg/dL (H)).    Allergies  Allergen Reactions  . Clams [Shellfish Allergy] Swelling and Other (See Comments)    THROAT SWELLS NECK TURNS RED  . Hydralazine Other (See Comments)    CHEST TIGHTNESS Patient has tolerated multiple doses of hydralazine since allergy was listed   . Norvasc [Amlodipine Besylate] Swelling    Leg edema   . Milk-Related Compounds Diarrhea    11/4 MRSA PCR: neg 11/11 BCx : ngF 11/11 urine: yeast 11/19 wound cx intra-op: pend  11/11 Vancomycin >> 11/11 Cefepime >>  Bertis Ruddy, PharmD Clinical Pharmacist Please check AMION for all [place] numbers 06/24/2019 10:17 AM

## 2019-06-24 NOTE — Progress Notes (Signed)
CRITICAL VALUE ALERT  Critical Value:  Hg: 6.8  Date & Time Notied:  0723 06/24/19  Provider Notified:  Dr. Wyline Copas   Orders Received/Actions taken: 1 unit RBC

## 2019-06-24 NOTE — Progress Notes (Signed)
PROGRESS NOTE    Taylor Tyler  ZHY:[id number] DOB: 20-Oct-1940 DOA: 05/30/2019 PCP: McLean-Scocuzza, Nino Glow, MD    Brief Narrative:  78 yo with diabetes mellitus type 2 presently not on medication, chronic diastolic hypertension, chronic kidney disease stage III, anemia, hypothyroidism, hyperlipidemia.   She presented to the ED on 10/26 for dyspnea after a fall out of bed the night before and being on the floor for about 8 hrs. Her son found her in the AM. She was short of breath for at least 2 days.   In ED, noted to be wheezing. Suspected to have acute diastolic CHF and started on Lasix.  Also noted to have AKI with Cr of 5.74 and K of 5.3.  Past Cr 2.64.  As it was felt that her renal failure was permanent, she was eventually started on dialysis. It was planned to d/c her to SNF however, course complicated by leukocytosis and fevers on 11/11. See below  She was transferred to [facility] on 11/11.  Assessment & Plan:   Principal Problem:   Acute CHF (congestive heart failure) (HCC) Active Problems:   Hypothyroidism   DM (diabetes mellitus), type 2 with renal complications (HCC)   Anemia   Acute renal failure superimposed on stage 3 chronic kidney disease (HCC)   Essential hypertension   Pressure injury of skin   Fever  Principal Problem:  Acute diastolic CHF (congestive heart failure)   Acute on chronic kidney disease stage 4 >> ESRD Metabolic acidosis - despite medical management, she did not diurese well and her Cr continued to rise - 11/4 - tunneled dialysis cath placed and underwent dialysis - Pt is anticipated to be arranged for long-term HD - an outpt dialysis spot has been found- her schedule will be Tu/Th/Sat -Nephrology following. Per Nephrology can be d/c anytime from Nephrology standpoint  Active Problems: Leukocytosis, fever > Sepsis from infected back wound - temp spike 103 on evening of 11/11 - COVID test done for a planned d/c to SNF on 11/11 was negative  (prior to temp spike) - blood cultures negative  - UA shows rare bacteria and > 50 wbc with yeast - Urine culture shows > 100 K yeast (Not UTI per ID)  - source likely a very small draining wound on lumbar spine related to lumbar fusion surgery in 2018 which has never healed- she has had an infection in the past s/p I and D and wound vac 11/19. Wound appears re-infected - Neurosurgery consulted on 11/12 - ID consulted 11/12 and assisting with antibiotics - Initially continued on Vanc and Cefepime being given with dialysis - Neurosurgery following and pt now s/p lumbar wound debridement with hardware removal -Wound culture pos for gm neg rods. ID recommending cefepime monotherapy for at least 6 weeks therapy  Acute toxic metabolic encephalopathy with underlying dementia - Family has noted memory loss for about 4-5 yrs- she has had delulsions and paranoia for about 2 years - Remains stable this AM, although seems somewhat confused   Hypothyroidism  - cont Synthroid as pt tolerates  DM ruled out - last A1c was 5.2- not on medication at home - sugars stable and controlled  AOCD - appreciate management per renal  - Remains stable at this time  HTN - on Norvasc, Labetalol, Hydralazine - BP remains stable  HLD - Continued on Lipitor - Remains stable  Anemia of chronic disease - pt with hgb down to 6.8 - Had ordered 1 unit PRBC with post-transfusion hgb of  8.7 - Repeat cbc in AM and cont to transfuse as needed  Decubitus ulcers  Pressure Injury 06/06/19 Sacrum Left Stage I -  Intact skin with non-blanchable redness of a localized area usually over a bony prominence. (Active)  06/06/19 1010  Location: Sacrum  Location Orientation: Left  Staging: Stage I -  Intact skin with non-blanchable redness of a localized area usually over a bony prominence.  Wound Description (Comments):   Present on Admission: No     Pressure Injury 06/12/19 Sacrum Left Stage II -  Partial  thickness loss of dermis presenting as a shallow open ulcer with a red, pink wound bed without slough. Pink (Active)  06/12/19 2115  Location: Sacrum  Location Orientation: Left  Staging: Stage II -  Partial thickness loss of dermis presenting as a shallow open ulcer with a red, pink wound bed without slough.  Wound Description (Comments): Pink  Present on Admission:      Pressure Injury 06/15/19 Buttocks Left Stage II -  Partial thickness loss of dermis presenting as a shallow open ulcer with a red, pink wound bed without slough. 100 % pinkk (Active)  06/15/19 1102  Location: Buttocks  Location Orientation: Left  Staging: Stage II -  Partial thickness loss of dermis presenting as a shallow open ulcer with a red, pink wound bed without slough.  Wound Description (Comments): 100 % pinkk  Present on Admission: No   DVT prophylaxis: SCD's Code Status: Full Family Communication: Pt in room, family not at bedside Disposition Plan: Uncertain at this time  Consultants:   Nephrology  Vascular surgery  NS  ID  Procedures:   11/4 tunneled cath  11/19 Lumbar wound debridement and hardware removal  Antimicrobials: Anti-infectives (From admission, onward)   Start     Dose/Rate Route Frequency Ordered Stop   06/23/19 1315  ceFEPIme (MAXIPIME) 2 g in sodium chloride 0.9 % 100 mL IVPB  Status:  Discontinued     2 g 200 mL/hr over 30 Minutes Intravenous To Surgery 06/23/19 1301 06/23/19 1624   06/23/19 1200  bacitracin 50,000 Units in sodium chloride 0.9 % 500 mL irrigation  Status:  Discontinued       As needed 06/23/19 1203 06/23/19 1359   06/16/19 1800  ceFEPIme (MAXIPIME) 2 g in sodium chloride 0.9 % 100 mL IVPB     2 g 200 mL/hr over 30 Minutes Intravenous Once per day on Tue Thu Sat 06/15/19 1904     06/16/19 1200  vancomycin (VANCOCIN) IVPB 1000 mg/200 mL premix  Status:  Discontinued     1,000 mg 200 mL/hr over 60 Minutes Intravenous Every T-Th-Sa (Hemodialysis) 06/15/19 1904  06/24/19 1209   06/15/19 2030  vancomycin (VANCOCIN) 2,000 mg in sodium chloride 0.9 % 500 mL IVPB     2,000 mg 250 mL/hr over 120 Minutes Intravenous  Once 06/15/19 1902 06/16/19 0010   06/15/19 2000  ceFEPIme (MAXIPIME) 2 g in sodium chloride 0.9 % 100 mL IVPB     2 g 200 mL/hr over 30 Minutes Intravenous  Once 06/15/19 1902 06/15/19 2028   06/08/19 0600  cefUROXime (ZINACEF) 1.5 g in sodium chloride 0.9 % 100 mL IVPB     1.5 g 200 mL/hr over 30 Minutes Intravenous On call to O.R. 06/07/19 1519 06/08/19 1328      Subjective: Without complaints at this time  Objective: Vitals:   06/24/19 0859 06/24/19 1140 06/24/19 1420 06/24/19 1559  BP: (!) 163/53 (!) 169/49 (!) 151/56 (!) 146/55  Pulse: 71 76  73  Resp: 17 19  17   Temp: 98 F (36.7 C) 98.3 F (36.8 C)  98.2 F (36.8 C)  TempSrc: Oral Oral  Oral  SpO2: 100% 100%  100%  Weight:      Height:        Intake/Output Summary (Last 24 hours) at 06/24/2019 1635 Last data filed at 06/24/2019 1230 Gross per 24 hour  Intake 555 ml  Output 75 ml  Net 480 ml   Filed Weights   06/23/19 0700 06/23/19 1109 06/24/19 0500  Weight: 83.7 kg 83.7 kg 95 kg    Examination: General exam: Awake, laying in bed, in nad Respiratory system: Normal respiratory effort, no wheezing Cardiovascular system: regular rate, s1, s2 Gastrointestinal system: Soft, nondistended, positive BS Central nervous system: CN2-12 grossly intact, strength intact Extremities: Perfused, no clubbing Skin: Normal skin turgor, no notable skin lesions seen Psychiatry: Mood normal // no visual hallucinations   Data Reviewed: I have personally reviewed following labs and imaging studies  CBC: Recent Labs  Lab 06/18/19 0541 06/21/19 2056 06/23/19 0442 06/23/19 1201 06/24/19 0619 06/24/19 1453  WBC 13.2* 14.7* 14.7*  --  19.0*  --   HGB 8.2* 7.6* 8.0* 10.9* 6.8* 8.7*  HCT 27.6* 26.0* 27.0* 32.0* 23.5* 28.2*  MCV 87.1 88.1 87.7  --  89.0  --   PLT 244 239  180  --  150  --    Basic Metabolic Panel: Recent Labs  Lab 06/18/19 0541 06/21/19 2056 06/23/19 0442 06/23/19 1201 06/24/19 0619  NA 132* 132* 133* 135 134*  K 4.0 3.6 3.9 3.5 4.0  CL 93* 96* 98 97* 98  CO2 23 23 22   --  20*  GLUCOSE 87 103* 99 128* 129*  BUN 31* 44* 39* 14 27*  CREATININE 4.90* 6.12* 5.80* 2.60* 4.30*  CALCIUM 8.5* 8.2* 8.5*  --  8.5*  PHOS  --  3.8  --   --   --    GFR: Estimated Creatinine Clearance: 12.2 mL/min (A) (by C-G formula based on SCr of 4.3 mg/dL (H)). Liver Function Tests: Recent Labs  Lab 06/21/19 2056 06/24/19 0619  AST  --  18  ALT  --  15  ALKPHOS  --  65  BILITOT  --  0.6  PROT  --  6.9  ALBUMIN 1.7* 2.3*   No results for input(s): LIPASE, AMYLASE in the last 168 hours. No results for input(s): AMMONIA in the last 168 hours. Coagulation Profile: No results for input(s): INR, PROTIME in the last 168 hours. Cardiac Enzymes: No results for input(s): CKTOTAL, CKMB, CKMBINDEX, TROPONINI in the last 168 hours. BNP (last 3 results) No results for input(s): PROBNP in the last 8760 hours. HbA1C: No results for input(s): HGBA1C in the last 72 hours. CBG: Recent Labs  Lab 06/23/19 1802 06/23/19 2108 06/24/19 0718 06/24/19 1136 06/24/19 1557  GLUCAP 144* 140* 135* 120* 115*   Lipid Profile: No results for input(s): CHOL, HDL, LDLCALC, TRIG, CHOLHDL, LDLDIRECT in the last 72 hours. Thyroid Function Tests: No results for input(s): TSH, T4TOTAL, FREET4, T3FREE, THYROIDAB in the last 72 hours. Anemia Panel: No results for input(s): VITAMINB12, FOLATE, FERRITIN, TIBC, IRON, RETICCTPCT in the last 72 hours. Sepsis Labs: No results for input(s): PROCALCITON, LATICACIDVEN in the last 168 hours.  Recent Results (from the past 240 hour(s))  SARS CORONAVIRUS 2 (TAT 6-24 HRS) Nasopharyngeal Nasopharyngeal Swab     Status: None   Collection Time: 06/15/19 11:20 AM  Specimen: Nasopharyngeal Swab  Result Value Ref Range Status   SARS  Coronavirus 2 NEGATIVE NEGATIVE Final    Comment: (NOTE) SARS-CoV-2 target nucleic acids are NOT DETECTED. The SARS-CoV-2 RNA is generally detectable in upper and lower respiratory specimens during the acute phase of infection. Negative results do not preclude SARS-CoV-2 infection, do not rule out co-infections with other pathogens, and should not be used as the sole basis for treatment or other patient management decisions. Negative results must be combined with clinical observations, patient history, and epidemiological information. The expected result is Negative. Fact Sheet for Patients: SugarRoll.be Fact Sheet for Healthcare Providers: https://www.woods-mathews.com/ This test is not yet approved or cleared by the [place] FDA and  has been authorized for detection and/or diagnosis of SARS-CoV-2 by FDA under an Emergency Use Authorization (EUA). This EUA will remain  in effect (meaning this test can be used) for the duration of the COVID-19 declaration under Section 56 4(b)(1) of the Act, 21 U.S.C. section 360bbb-3(b)(1), unless the authorization is terminated or revoked sooner. Performed at [facility], [place] [address], [place], [place] [address]   Culture, blood (routine x 2)     Status: None   Collection Time: 06/15/19 12:45 PM   Specimen: BLOOD  Result Value Ref Range Status   Specimen Description BLOOD RIGHT ANTECUBITAL  Final   Special Requests   Final    BOTTLES DRAWN AEROBIC ONLY Blood Culture results may not be optimal due to an inadequate volume of blood received in culture bottles   Culture   Final    NO GROWTH 5 DAYS Performed at [facility], [place] [address], [place], [place] [address]    Report Status 06/20/2019 FINAL  Final  Culture, blood (routine x 2)     Status: None   Collection Time: 06/15/19 12:49 PM   Specimen: BLOOD  Result Value Ref Range Status   Specimen Description BLOOD RIGHT ANTECUBITAL   Final   Special Requests   Final    BOTTLES DRAWN AEROBIC ONLY Blood Culture results may not be optimal due to an inadequate volume of blood received in culture bottles   Culture   Final    NO GROWTH 5 DAYS Performed at [facility], [place] [address] [place], [place] [address]    Report Status 06/20/2019 FINAL  Final  Culture, Urine     Status: Abnormal   Collection Time: 06/15/19  6:49 PM   Specimen: Urine, Random  Result Value Ref Range Status   Specimen Description URINE, RANDOM  Final   Special Requests   Final    NONE Performed at [facility], [place] [address] [place], [place] [address]    Culture >=100,000 COLONIES/mL YEAST (A)  Final   Report Status 06/17/2019 FINAL  Final  SARS CORONAVIRUS 2 (TAT 6-24 HRS)     Status: None   Collection Time: 06/23/19  5:40 AM  Result Value Ref Range Status   SARS Coronavirus 2 NEGATIVE NEGATIVE Final    Comment: (NOTE) SARS-CoV-2 target nucleic acids are NOT DETECTED. The SARS-CoV-2 RNA is generally detectable in upper and lower respiratory specimens during the acute phase of infection. Negative results do not preclude SARS-CoV-2 infection, do not rule out co-infections with other pathogens, and should not be used as the sole basis for treatment or other patient management decisions. Negative results must be combined with clinical observations, patient history, and epidemiological information. The expected result is Negative. Fact Sheet for Patients:  SugarRoll.be Fact Sheet for Healthcare Providers: https://www.woods-mathews.com/ This test is not yet approved or cleared by the [place] FDA and  has been authorized for detection and/or diagnosis of SARS-CoV-2 by FDA under an Emergency Use Authorization (EUA). This EUA will remain  in effect (meaning this test can be used) for the duration of the COVID-19 declaration under Section 56 4(b)(1) of the Act, 21 U.S.C. section  360bbb-3(b)(1), unless the authorization is terminated or revoked sooner. Performed at [facility], [place] [address], [place], [place] [address]   Aerobic/Anaerobic Culture (surgical/deep wound)     Status: None (Preliminary result)   Collection Time: 06/23/19  1:02 PM   Specimen: Soft Tissue, Other  Result Value Ref Range Status   Specimen Description TISSUE LUMBAR  Final   Special Requests PATIENT ON FOLLOWING VANC  Final   Gram Stain   Final    MODERATE WBC PRESENT,BOTH PMN AND MONONUCLEAR NO ORGANISMS SEEN    Culture   Final    RARE PROTEUS MIRABILIS CRITICAL RESULT CALLED TO, READ BACK BY AND VERIFIED WITH: H. STONE, RN AT 1055 ON 06/24/19 BY C. JESSUP, MT. SUSCEPTIBILITIES TO FOLLOW Performed at [facility], [place] [address] [place],  [address]    Report Status PENDING  Incomplete     Radiology Studies: No results found.  Scheduled Meds: . amLODipine  10 mg Oral Daily  . atorvastatin  40 mg Oral q1800  . Chlorhexidine Gluconate Cloth  6 each Topical Q0600  . Chlorhexidine Gluconate Cloth  6 each Topical Q0600  . darbepoetin (ARANESP) injection - DIALYSIS  150 mcg Intravenous Q Sat-HD  . gabapentin  200 mg Oral QHS  . hydrALAZINE  100 mg Oral Q8H  . labetalol  200 mg Oral BID  . levothyroxine  100 mcg Oral QAC breakfast   Continuous Infusions: . sodium chloride 10 mL/hr at 06/18/19 1746  . sodium chloride 10 mL/hr at 06/23/19 1205  . ceFEPime (MAXIPIME) IV 2 g (06/22/19 0129)     LOS: 25 days   Marylu Lund, MD [facility] Pager On Amion  If 7PM-7AM, please contact night-coverage 06/24/2019, 4:35 PM

## 2019-06-24 NOTE — Progress Notes (Signed)
  NEUROSURGERY PROGRESS NOTE   No issues overnight.  Receiving 1 unit PRBC due to anemia Slightly confused this am  EXAM:  BP (!) 163/53   Pulse 71   Temp 98 F (36.7 C) (Oral)   Resp 17   Ht 5\' 3"  (1.6 m)   Wt 95 kg   SpO2 100%   BMI 37.10 kg/m   Awake, alert Speech slow Difficulties following commands but does move BLE, L>R Incision: wound vac in place  IMPRESSION/PLAN 78 y.o. female pod #1 lumbar wound debridement, removal hardware, placement wound vac. Appears confused, although she has been waxing/waning this hospitalization. - continue supportive care, abx per ID - No new NS recs - cleared for d/c from NS perspective

## 2019-06-24 NOTE — Progress Notes (Addendum)
[facility]  Date of Admission:  05/30/2019     Total days of antibiotics 9         ASSESSMENT:  Ms. Bernabe is POD 1 from lumbar wound debridement, hardware removal and placement of wound vac. Surgical notes confirmed infection with purulent material down to the hardware and around the left S1 screw. No acute events overnight. Surgical specimens obtained without organisms and culture with gram negative rods. 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 Will need prolonged therapy of at least 6 weeks of IV therapy going forward.     Patient was seen, examined,treatment plan was discussed with the  Advance Practice Provider.  I have personally reviewed the clinical findings, labs, imaging studies and management of this patient in detail.  I agree with the documentation, as recorded by the Advance Practice Provider.    PLAN:  1. Discontinue vancomycin. 2. Continue Cefepime , patient will need picc line to treat for 6 wks , home health orders listed below. But please place opat order 3. Wound care per neurosurgery 4. Will arrange follow up in the id clinic in 4-6 wk ----------- Diagnosis: HW lumbar infection  Culture Result: proteus  Allergies  Allergen Reactions  . Clams [Shellfish Allergy] Swelling and Other (See Comments)    THROAT SWELLS NECK TURNS RED  . Hydralazine Other (See Comments)    CHEST TIGHTNESS Patient has tolerated multiple doses of hydralazine since allergy was listed   . Norvasc [Amlodipine Besylate] Swelling    Leg edema   . Milk-Related Compounds Diarrhea    OPAT Orders Discharge antibiotics: Per pharmacy protocol  6 wks with cefepime  Duration: 6 wk End Date: Jan 1st  [place] Per Protocol:  Labs weekly while on IV antibiotics: _x_ CBC with differential _x_ BMP _x_ CRP _x_ ESR  _x_ Please pull PIC at  completion of IV antibiotics   Fax weekly labs to [phone]  Clinic Follow Up Appt: 4-6 wk  '@[facility]ID'  .   Principal Problem:   Acute CHF (congestive heart failure) (HCC) Active Problems:   Hypothyroidism   DM (diabetes mellitus), type 2 with renal complications (HCC)   Anemia   Acute renal failure superimposed on stage 3 chronic kidney disease (HCC)   Essential hypertension   Pressure injury of skin   Fever   . amLODipine  10 mg Oral Daily  . atorvastatin  40 mg Oral q1800  . Chlorhexidine Gluconate Cloth  6 each Topical Q0600  . Chlorhexidine Gluconate Cloth  6 each Topical Q0600  . darbepoetin (ARANESP) injection - DIALYSIS  150 mcg Intravenous Q Sat-HD  . gabapentin  200 mg Oral QHS  . hydrALAZINE  100 mg Oral Q8H  . labetalol  200 mg Oral BID  . levothyroxine  100 mcg Oral QAC breakfast    SUBJECTIVE:  Afebrile overnight with no acute events or concerns. Feeling okay today.   Allergies  Allergen Reactions  . Clams [Shellfish Allergy] Swelling and Other (See Comments)    THROAT SWELLS NECK TURNS RED  . Hydralazine Other (See Comments)    CHEST TIGHTNESS Patient has tolerated multiple doses of hydralazine since allergy was listed   . Norvasc [Amlodipine Besylate] Swelling    Leg edema   . Milk-Related Compounds Diarrhea     Review of Systems: Review of Systems  Constitutional: Negative for chills, fever and weight loss.  Respiratory:  Negative for cough, shortness of breath and wheezing.   Cardiovascular: Negative for chest pain and leg swelling.  Gastrointestinal: Negative for abdominal pain, constipation, diarrhea, nausea and vomiting.  Skin: Negative for rash.      OBJECTIVE: Vitals:   06/24/19 0715 06/24/19 0837 06/24/19 0859 06/24/19 1140  BP: (!) 156/55 (!) 143/54 (!) 163/53 (!) 169/49  Pulse: 71 69 71 76  Resp: '17 15 17 19  ' Temp: 97.8 F (36.6 C) 97.9 F (36.6 C) 98 F (36.7 C) 98.3 F (36.8 C)  TempSrc: Oral Oral Oral Oral  SpO2:  100% 100% 100% 100%  Weight:      Height:       Body mass index is 37.1 kg/m.  Physical Exam Constitutional:      General: She is not in acute distress.    Appearance: She is well-developed.     Comments: Lying in bed with head of bed elevated; pleasant.   Cardiovascular:     Rate and Rhythm: Normal rate and regular rhythm.     Heart sounds: Normal heart sounds.  Pulmonary:     Effort: Pulmonary effort is normal.     Breath sounds: Normal breath sounds.  Musculoskeletal:     Comments: VAC functioning appropriately with no drainage present.   Skin:    General: Skin is warm and dry.  Neurological:     Mental Status: She is alert.  Psychiatric:        Mood and Affect: Mood normal.        Thought Content: Thought content normal.        Judgment: Judgment normal.     Lab Results Lab Results  Component Value Date   WBC 19.0 (H) 06/24/2019   HGB 6.8 (LL) 06/24/2019   HCT 23.5 (L) 06/24/2019   MCV 89.0 06/24/2019   PLT 150 06/24/2019    Lab Results  Component Value Date   CREATININE 4.30 (H) 06/24/2019   BUN 27 (H) 06/24/2019   NA 134 (L) 06/24/2019   K 4.0 06/24/2019   CL 98 06/24/2019   CO2 20 (L) 06/24/2019    Lab Results  Component Value Date   ALT 15 06/24/2019   AST 18 06/24/2019   ALKPHOS 65 06/24/2019   BILITOT 0.6 06/24/2019     Microbiology: Recent Results (from the past 240 hour(s))  SARS CORONAVIRUS 2 (TAT 6-24 HRS) Nasopharyngeal Nasopharyngeal Swab     Status: None   Collection Time: 06/15/19 11:20 AM   Specimen: Nasopharyngeal Swab  Result Value Ref Range Status   SARS Coronavirus 2 NEGATIVE NEGATIVE Final    Comment: (NOTE) SARS-CoV-2 target nucleic acids are NOT DETECTED. The SARS-CoV-2 RNA is generally detectable in upper and lower respiratory specimens during the acute phase of infection. Negative results do not preclude SARS-CoV-2 infection, do not rule out co-infections with other pathogens, and should not be used as the sole basis  for treatment or other patient management decisions. Negative results must be combined with clinical observations, patient history, and epidemiological information. The expected result is Negative. Fact Sheet for Patients: SugarRoll.be Fact Sheet for Healthcare Providers: https://www.woods-mathews.com/ This test is not yet approved or cleared by the [place] FDA and  has been authorized for detection and/or diagnosis of SARS-CoV-2 by FDA under an Emergency Use Authorization (EUA). This EUA will remain  in effect (meaning this test can be used) for the duration of the COVID-19 declaration under Section 56 4(b)(1) of the Act, 21 U.S.C. section 360bbb-3(b)(1), unless the  authorization is terminated or revoked sooner. Performed at [facility], [place] [address], [place], [place] [address]   Culture, blood (routine x 2)     Status: None   Collection Time: 06/15/19 12:45 PM   Specimen: BLOOD  Result Value Ref Range Status   Specimen Description BLOOD RIGHT ANTECUBITAL  Final   Special Requests   Final    BOTTLES DRAWN AEROBIC ONLY Blood Culture results may not be optimal due to an inadequate volume of blood received in culture bottles   Culture   Final    NO GROWTH 5 DAYS Performed at [facility], [place] [address] [place], [place] [address]    Report Status 06/20/2019 FINAL  Final  Culture, blood (routine x 2)     Status: None   Collection Time: 06/15/19 12:49 PM   Specimen: BLOOD  Result Value Ref Range Status   Specimen Description BLOOD RIGHT ANTECUBITAL  Final   Special Requests   Final    BOTTLES DRAWN AEROBIC ONLY Blood Culture results may not be optimal due to an inadequate volume of blood received in culture bottles   Culture   Final    NO GROWTH 5 DAYS Performed at [facility], [place] [address], [place], [place] [address]    Report Status 06/20/2019 FINAL  Final  Culture, Urine     Status: Abnormal   Collection  Time: 06/15/19  6:49 PM   Specimen: Urine, Random  Result Value Ref Range Status   Specimen Description URINE, RANDOM  Final   Special Requests   Final    NONE Performed at [facility], [place] [address], [place], [place] [address]    Culture >=100,000 COLONIES/mL YEAST (A)  Final   Report Status 06/17/2019 FINAL  Final  SARS CORONAVIRUS 2 (TAT 6-24 HRS)     Status: None   Collection Time: 06/23/19  5:40 AM  Result Value Ref Range Status   SARS Coronavirus 2 NEGATIVE NEGATIVE Final    Comment: (NOTE) SARS-CoV-2 target nucleic acids are NOT DETECTED. The SARS-CoV-2 RNA is generally detectable in upper and lower respiratory specimens during the acute phase of infection. Negative results do not preclude SARS-CoV-2 infection, do not rule out co-infections with other pathogens, and should not be used as the sole basis for treatment or other patient management decisions. Negative results must be combined with clinical observations, patient history, and epidemiological information. The expected result is Negative. Fact Sheet for Patients: SugarRoll.be Fact Sheet for Healthcare Providers: https://www.woods-mathews.com/ This test is not yet approved or cleared by the [place] FDA and  has been authorized for detection and/or diagnosis of SARS-CoV-2 by FDA under an Emergency Use Authorization (EUA). This EUA will remain  in effect (meaning this test can be used) for the duration of the COVID-19 declaration under Section 56 4(b)(1) of the Act, 21 U.S.C. section 360bbb-3(b)(1), unless the authorization is terminated or revoked sooner. Performed at [facility], [place] [address] [place], [place] [address]   Aerobic/Anaerobic Culture (surgical/deep wound)     Status: None (Preliminary result)   Collection Time: 06/23/19  1:02 PM   Specimen: Soft Tissue, Other  Result Value Ref Range Status   Specimen Description TISSUE LUMBAR  Final    Special Requests PATIENT ON FOLLOWING VANC  Final   Gram Stain   Final    MODERATE WBC PRESENT,BOTH PMN AND MONONUCLEAR NO ORGANISMS SEEN    Culture   Final  RARE GRAM NEGATIVE RODS CRITICAL RESULT CALLED TO, READ BACK BY AND VERIFIED WITH: H. STONE, RN AT 1055 ON 06/24/19 BY C. JESSUP, MT. Performed at [facility], [place] [address] [place], [place] [address]    Report Status PENDING  Incomplete     Terri Piedra, [place] for [place] [phone] Pager  06/24/2019  12:06 PM

## 2019-06-25 LAB — BPAM RBC
Blood Product Expiration Date: 202012082359
ISSUE DATE / TIME: 202011200829
Unit Type and Rh: 6200

## 2019-06-25 LAB — RENAL FUNCTION PANEL
Albumin: 2.3 g/dL — ABNORMAL LOW (ref 3.5–5.0)
Anion gap: 15 (ref 5–15)
BUN: 41 mg/dL — ABNORMAL HIGH (ref 8–23)
CO2: 21 mmol/L — ABNORMAL LOW (ref 22–32)
Calcium: 8.7 mg/dL — ABNORMAL LOW (ref 8.9–10.3)
Chloride: 97 mmol/L — ABNORMAL LOW (ref 98–111)
Creatinine, Ser: 5.9 mg/dL — ABNORMAL HIGH (ref 0.44–1.00)
GFR calc Af Amer: 7 mL/min — ABNORMAL LOW (ref >60)
GFR calc non Af Amer: 6 mL/min — ABNORMAL LOW (ref >60)
Glucose, Bld: 162 mg/dL — ABNORMAL HIGH (ref 70–99)
Phosphorus: 4.4 mg/dL (ref 2.5–4.6)
Potassium: 3.9 mmol/L (ref 3.5–5.1)
Sodium: 133 mmol/L — ABNORMAL LOW (ref 135–145)

## 2019-06-25 LAB — TYPE AND SCREEN
ABO/RH(D): A POS
Antibody Screen: NEGATIVE
Unit division: 0

## 2019-06-25 LAB — GLUCOSE, CAPILLARY
Glucose-Capillary: 97 mg/dL (ref 70–99)
Glucose-Capillary: 121 mg/dL — ABNORMAL HIGH (ref 70–99)
Glucose-Capillary: 110 mg/dL — ABNORMAL HIGH (ref 70–99)

## 2019-06-25 LAB — CBC
HCT: 28.8 % — ABNORMAL LOW (ref 36.0–46.0)
Hemoglobin: 8.7 g/dL — ABNORMAL LOW (ref 12.0–15.0)
MCH: 26.5 pg (ref 26.0–34.0)
MCHC: 30.2 g/dL (ref 30.0–36.0)
MCV: 87.8 fL (ref 80.0–100.0)
Platelets: 163 10*3/uL (ref 150–400)
RBC: 3.28 MIL/uL — ABNORMAL LOW (ref 3.87–5.11)
RDW: 17.2 % — ABNORMAL HIGH (ref 11.5–15.5)
WBC: 18.5 10*3/uL — ABNORMAL HIGH (ref 4.0–10.5)
nRBC: 0.9 % — ABNORMAL HIGH (ref 0.0–0.2)

## 2019-06-25 MED ORDER — SODIUM CHLORIDE 0.9 % IV SOLN
2.0000 g | INTRAVENOUS | Status: DC
  Administered 2019-06-25 – 2019-06-26 (×2): 2 g via INTRAVENOUS
  Filled 2019-06-25: qty 2
  Filled 2019-06-25 (×2): qty 20

## 2019-06-25 MED ORDER — DARBEPOETIN ALFA 150 MCG/0.3ML IJ SOSY
PREFILLED_SYRINGE | INTRAMUSCULAR | Status: AC
  Filled 2019-06-25: qty 0.3

## 2019-06-25 MED ORDER — HEPARIN SODIUM (PORCINE) 1000 UNIT/ML IJ SOLN
INTRAMUSCULAR | Status: AC
  Administered 2019-06-25: 3400 [IU]
  Filled 2019-06-25: qty 4

## 2019-06-25 NOTE — Progress Notes (Signed)
PROGRESS NOTE    Taylor Tyler  OBS:[id number] DOB: August 31, 1940 DOA: 05/30/2019 PCP: McLean-Scocuzza, Nino Glow, MD    Brief Narrative:  78 yo with diabetes mellitus type 2 presently not on medication, chronic diastolic hypertension, chronic kidney disease stage III, anemia, hypothyroidism, hyperlipidemia.   She presented to the ED on 10/26 for dyspnea after a fall out of bed the night before and being on the floor for about 8 hrs. Her son found her in the AM. She was short of breath for at least 2 days.   In ED, noted to be wheezing. Suspected to have acute diastolic CHF and started on Lasix.  Also noted to have AKI with Cr of 5.74 and K of 5.3.  Past Cr 2.64.  As it was felt that her renal failure was permanent, she was eventually started on dialysis. It was planned to d/c her to SNF however, course complicated by leukocytosis and fevers on 11/11. See below  She was transferred to [facility] on 11/11.  Assessment & Plan:   Principal Problem:   Acute CHF (congestive heart failure) (HCC) Active Problems:   Hypothyroidism   DM (diabetes mellitus), type 2 with renal complications (HCC)   Anemia   Acute renal failure superimposed on stage 3 chronic kidney disease (HCC)   Essential hypertension   Pressure injury of skin   Fever  Principal Problem:  Acute diastolic CHF (congestive heart failure)   Acute on chronic kidney disease stage 4 >> ESRD Metabolic acidosis - despite medical management, she did not diurese well and her Cr continued to rise - 11/4 - tunneled dialysis cath placed and underwent dialysis - Pt is anticipated to be arranged for long-term HD - an outpt dialysis spot has been found- her schedule will be Tu/Th/Sat -Nephrology following. Per Nephrology can be d/c anytime from Nephrology standpoint -Stable thus far. Pt undergoing HD this AM  Active Problems: Leukocytosis, fever > Sepsis from infected back wound - temp spike 103 on evening of 11/11 - COVID test done for a  planned d/c to SNF on 11/11 was negative (prior to temp spike) - blood cultures negative  - UA shows rare bacteria and > 50 wbc with yeast - Urine culture shows > 100 K yeast (Not UTI per ID)  - source likely a very small draining wound on lumbar spine related to lumbar fusion surgery in 2018 which has never healed- she has had an infection in the past s/p I and D and wound vac 11/19. Wound appears re-infected - Neurosurgery consulted on 11/12 - ID consulted 11/12 and assisting with antibiotics - Initially continued on Vanc and Cefepime being given with dialysis - Neurosurgery following and pt now s/p lumbar wound debridement with hardware removal -Wound culture pos Proteus. ID initially recommended cefepime monotherapy for at least 6 weeks therapy. Discussed with pharmacy. Will narrow to rocephin  Acute toxic metabolic encephalopathy with underlying dementia - Family has noted memory loss for about 4-5 yrs- she has had delulsions and paranoia for about 2 years - Remains stable this AM. Conversing appropriately   Hypothyroidism  - cont Synthroid as pt tolerates  DM ruled out - last A1c was 5.2- not on medication at home - sugars stable and controlled  AOCD - appreciate management per renal  - Remains stable currently  HTN - on Norvasc, Labetalol, Hydralazine - BP stable at this time  HLD - Continued on Lipitor - Currently stable  Anemia of chronic disease - pt with hgb down to  6.8 - Had ordered 1 unit PRBC with post-transfusion hgb of 8.7 - Hgb remains stable, reviewed. Cont to follow CBC and transfuse as needed  Decubitus ulcers  Pressure Injury 06/06/19 Sacrum Left Stage I -  Intact skin with non-blanchable redness of a localized area usually over a bony prominence. (Active)  06/06/19 1010  Location: Sacrum  Location Orientation: Left  Staging: Stage I -  Intact skin with non-blanchable redness of a localized area usually over a bony prominence.  Wound  Description (Comments):   Present on Admission: No     Pressure Injury 06/12/19 Sacrum Left Stage II -  Partial thickness loss of dermis presenting as a shallow open ulcer with a red, pink wound bed without slough. Pink (Active)  06/12/19 2115  Location: Sacrum  Location Orientation: Left  Staging: Stage II -  Partial thickness loss of dermis presenting as a shallow open ulcer with a red, pink wound bed without slough.  Wound Description (Comments): Pink  Present on Admission:      Pressure Injury 06/15/19 Buttocks Left Stage II -  Partial thickness loss of dermis presenting as a shallow open ulcer with a red, pink wound bed without slough. 100 % pinkk (Active)  06/15/19 1102  Location: Buttocks  Location Orientation: Left  Staging: Stage II -  Partial thickness loss of dermis presenting as a shallow open ulcer with a red, pink wound bed without slough.  Wound Description (Comments): 100 % pinkk  Present on Admission: No   DVT prophylaxis: SCD's Code Status: Full Family Communication: Pt in room, family not at bedside Disposition Plan: Uncertain at this time  Consultants:   Nephrology  Vascular surgery  NS  ID  Procedures:   11/4 tunneled cath  11/19 Lumbar wound debridement and hardware removal  Antimicrobials: Anti-infectives (From admission, onward)   Start     Dose/Rate Route Frequency Ordered Stop   06/25/19 1800  cefTRIAXone (ROCEPHIN) 2 g in sodium chloride 0.9 % 100 mL IVPB     2 g 200 mL/hr over 30 Minutes Intravenous Every 24 hours 06/25/19 1446     06/23/19 1315  ceFEPIme (MAXIPIME) 2 g in sodium chloride 0.9 % 100 mL IVPB  Status:  Discontinued     2 g 200 mL/hr over 30 Minutes Intravenous To Surgery 06/23/19 1301 06/23/19 1624   06/23/19 1200  bacitracin 50,000 Units in sodium chloride 0.9 % 500 mL irrigation  Status:  Discontinued       As needed 06/23/19 1203 06/23/19 1359   06/16/19 1800  ceFEPIme (MAXIPIME) 2 g in sodium chloride 0.9 % 100 mL IVPB   Status:  Discontinued     2 g 200 mL/hr over 30 Minutes Intravenous Once per day on Tue Thu Sat 06/15/19 1904 06/25/19 1446   06/16/19 1200  vancomycin (VANCOCIN) IVPB 1000 mg/200 mL premix  Status:  Discontinued     1,000 mg 200 mL/hr over 60 Minutes Intravenous Every T-Th-Sa (Hemodialysis) 06/15/19 1904 06/24/19 1209   06/15/19 2030  vancomycin (VANCOCIN) 2,000 mg in sodium chloride 0.9 % 500 mL IVPB     2,000 mg 250 mL/hr over 120 Minutes Intravenous  Once 06/15/19 1902 06/16/19 0010   06/15/19 2000  ceFEPIme (MAXIPIME) 2 g in sodium chloride 0.9 % 100 mL IVPB     2 g 200 mL/hr over 30 Minutes Intravenous  Once 06/15/19 1902 06/15/19 2028   06/08/19 0600  cefUROXime (ZINACEF) 1.5 g in sodium chloride 0.9 % 100 mL IVPB  1.5 g 200 mL/hr over 30 Minutes Intravenous On call to O.R. 06/07/19 1519 06/08/19 1328      Subjective: Eager to be discharged soon. In good spirits  Objective: Vitals:   06/25/19 1200 06/25/19 1230 06/25/19 1305 06/25/19 1540  BP: (!) 97/54 (!) 145/63 (!) 135/57 (!) 147/69  Pulse: 94 82 82 80  Resp: (!) 23 (!) 25 (!) 22 (!) 24  Temp:  98.4 F (36.9 C) 97.9 F (36.6 C) 98.8 F (37.1 C)  TempSrc:  Axillary Axillary Axillary  SpO2:  98% 99% 100%  Weight:  91 kg    Height:        Intake/Output Summary (Last 24 hours) at 06/25/2019 1600 Last data filed at 06/25/2019 1230 Gross per 24 hour  Intake 480 ml  Output 2114 ml  Net -1634 ml   Filed Weights   06/24/19 0500 06/25/19 0915 06/25/19 1230  Weight: 95 kg 93 kg 91 kg    Examination: General exam: Conversant, in no acute distress Respiratory system: normal chest rise, clear, no audible wheezing Cardiovascular system: regular rhythm, s1-s2 Gastrointestinal system: Nondistended, nontender, pos BS Central nervous system: No seizures, no tremors Extremities: No cyanosis, no joint deformities Skin: No rashes, no pallor Psychiatry: Affect normal // no auditory hallucinations   Data Reviewed: I  have personally reviewed following labs and imaging studies  CBC: Recent Labs  Lab 06/21/19 2056 06/23/19 0442 06/23/19 1201 06/24/19 0619 06/24/19 1453 06/25/19 0627  WBC 14.7* 14.7*  --  19.0*  --  18.5*  HGB 7.6* 8.0* 10.9* 6.8* 8.7* 8.7*  HCT 26.0* 27.0* 32.0* 23.5* 28.2* 28.8*  MCV 88.1 87.7  --  89.0  --  87.8  PLT 239 180  --  150  --  536   Basic Metabolic Panel: Recent Labs  Lab 06/21/19 2056 06/23/19 0442 06/23/19 1201 06/24/19 0619 06/25/19 0947  NA 132* 133* 135 134* 133*  K 3.6 3.9 3.5 4.0 3.9  CL 96* 98 97* 98 97*  CO2 23 22  --  20* 21*  GLUCOSE 103* 99 128* 129* 162*  BUN 44* 39* 14 27* 41*  CREATININE 6.12* 5.80* 2.60* 4.30* 5.90*  CALCIUM 8.2* 8.5*  --  8.5* 8.7*  PHOS 3.8  --   --   --  4.4   GFR: Estimated Creatinine Clearance: 8.7 mL/min (A) (by C-G formula based on SCr of 5.9 mg/dL (H)). Liver Function Tests: Recent Labs  Lab 06/21/19 2056 06/24/19 0619 06/25/19 0947  AST  --  18  --   ALT  --  15  --   ALKPHOS  --  65  --   BILITOT  --  0.6  --   PROT  --  6.9  --   ALBUMIN 1.7* 2.3* 2.3*   No results for input(s): LIPASE, AMYLASE in the last 168 hours. No results for input(s): AMMONIA in the last 168 hours. Coagulation Profile: No results for input(s): INR, PROTIME in the last 168 hours. Cardiac Enzymes: No results for input(s): CKTOTAL, CKMB, CKMBINDEX, TROPONINI in the last 168 hours. BNP (last 3 results) No results for input(s): PROBNP in the last 8760 hours. HbA1C: No results for input(s): HGBA1C in the last 72 hours. CBG: Recent Labs  Lab 06/24/19 0718 06/24/19 1136 06/24/19 1557 06/24/19 2318 06/25/19 0733  GLUCAP 135* 120* 115* 117* 97   Lipid Profile: No results for input(s): CHOL, HDL, LDLCALC, TRIG, CHOLHDL, LDLDIRECT in the last 72 hours. Thyroid Function Tests: No results for input(s):  TSH, T4TOTAL, FREET4, T3FREE, THYROIDAB in the last 72 hours. Anemia Panel: No results for input(s): VITAMINB12, FOLATE,  FERRITIN, TIBC, IRON, RETICCTPCT in the last 72 hours. Sepsis Labs: No results for input(s): PROCALCITON, LATICACIDVEN in the last 168 hours.  Recent Results (from the past 240 hour(s))  Culture, Urine     Status: Abnormal   Collection Time: 06/15/19  6:49 PM   Specimen: Urine, Random  Result Value Ref Range Status   Specimen Description URINE, RANDOM  Final   Special Requests   Final    NONE Performed at [facility], [address] [place], [place] [address]    Culture >=100,000 COLONIES/mL YEAST (A)  Final   Report Status 06/17/2019 FINAL  Final  SARS CORONAVIRUS 2 (TAT 6-24 HRS)     Status: None   Collection Time: 06/23/19  5:40 AM  Result Value Ref Range Status   SARS Coronavirus 2 NEGATIVE NEGATIVE Final    Comment: (NOTE) SARS-CoV-2 target nucleic acids are NOT DETECTED. The SARS-CoV-2 RNA is generally detectable in upper and lower respiratory specimens during the acute phase of infection. Negative results do not preclude SARS-CoV-2 infection, do not rule out co-infections with other pathogens, and should not be used as the sole basis for treatment or other patient management decisions. Negative results must be combined with clinical observations, patient history, and epidemiological information. The expected result is Negative. Fact Sheet for Patients: SugarRoll.be Fact Sheet for Healthcare Providers: https://www.woods-mathews.com/ This test is not yet approved or cleared by the [place] FDA and  has been authorized for detection and/or diagnosis of SARS-CoV-2 by FDA under an Emergency Use Authorization (EUA). This EUA will remain  in effect (meaning this test can be used) for the duration of the COVID-19 declaration under Section 56 4(b)(1) of the Act, 21 U.S.C. section 360bbb-3(b)(1), unless the authorization is terminated or revoked sooner. Performed at [facility], [place] [address] [place], [place]  [address]   Aerobic/Anaerobic Culture (surgical/deep wound)     Status: None (Preliminary result)   Collection Time: 06/23/19  1:02 PM   Specimen: Soft Tissue, Other  Result Value Ref Range Status   Specimen Description TISSUE LUMBAR  Final   Special Requests PATIENT ON FOLLOWING VANC  Final   Gram Stain   Final    MODERATE WBC PRESENT,BOTH PMN AND MONONUCLEAR NO ORGANISMS SEEN Performed at [facility], [address] [place], [place] [address]    Culture   Final    RARE PROTEUS MIRABILIS CRITICAL RESULT CALLED TO, READ BACK BY AND VERIFIED WITH: H. STONE, RN AT 1055 ON 06/24/19 BY C. JESSUP, MT. NO ANAEROBES ISOLATED; CULTURE IN PROGRESS FOR 5 DAYS    Report Status PENDING  Incomplete   Organism ID, Bacteria PROTEUS MIRABILIS  Final      Susceptibility   Proteus mirabilis - MIC*    AMPICILLIN <=2 SENSITIVE Sensitive     CEFAZOLIN <=4 SENSITIVE Sensitive     CEFEPIME <=1 SENSITIVE Sensitive     CEFTAZIDIME <=1 SENSITIVE Sensitive     CEFTRIAXONE <=1 SENSITIVE Sensitive     CIPROFLOXACIN <=0.25 SENSITIVE Sensitive     GENTAMICIN <=1 SENSITIVE Sensitive     IMIPENEM 8 INTERMEDIATE Intermediate     TRIMETH/SULFA <=20 SENSITIVE Sensitive     AMPICILLIN/SULBACTAM <=2 SENSITIVE Sensitive     PIP/TAZO <=4 SENSITIVE Sensitive     * RARE PROTEUS MIRABILIS     Radiology Studies: No results found.  Scheduled Meds: .  amLODipine  10 mg Oral Daily  . atorvastatin  40 mg Oral q1800  . Chlorhexidine Gluconate Cloth  6 each Topical Q0600  . Chlorhexidine Gluconate Cloth  6 each Topical Q0600  . darbepoetin (ARANESP) injection - DIALYSIS  150 mcg Intravenous Q Sat-HD  . gabapentin  200 mg Oral QHS  . hydrALAZINE  100 mg Oral Q8H  . labetalol  200 mg Oral BID  . levothyroxine  100 mcg Oral QAC breakfast   Continuous Infusions: . sodium chloride 10 mL/hr at 06/18/19 1746  . sodium chloride 10 mL/hr at 06/23/19 1205  . cefTRIAXone (ROCEPHIN)  IV       LOS: 26 days   Taylor Lund, MD [facility] Pager On Amion  If 7PM-7AM, please contact night-coverage 06/25/2019, 4:00 PM

## 2019-06-25 NOTE — Progress Notes (Signed)
Postop day 2 from lumbar wound debridement and removal of hardware.  Patient looks pretty good overall.  Mild pain.  No radicular symptoms.  Mental status improved by report.  [place] working well.  Overall progressing well.  Proceed with discharge plan.

## 2019-06-25 NOTE — Progress Notes (Signed)
  [facility] Progress Note    Assessment/ Plan:   1. ESRD, new this admission.  On THS schedule currently--> has TTS spot [place],  Maturing AVF. Next HD is Monday 11/23 d/t holiday schedule 2. Fever/ inc WBC: NSG and ID following, will need spinal washout-- had it 11/19, wound vac in place, cultures growing proteus, on cefepime for 6 weeks--> we can give this in dialysis and PICC contraindicated in ESRD 3. Anemia: IV Fe and ESA, CTM, transfuse prn 4. 2HPTH< stable 5. HTN: stable  6. Dispo: pending.  From a renal perspective can go anytime.    Subjective:    Seen in room.  Had HD this AM with 2L off.  Did well.  Having some pain.   Objective:   BP (!) 147/69 (BP Location: Right Arm)   Pulse 80   Temp 98.8 F (37.1 C) (Axillary)   Resp (!) 24   Ht 5\' 3"  (1.6 m)   Wt 91 kg   SpO2 100%   BMI 35.54 kg/m   Intake/Output Summary (Last 24 hours) at 06/25/2019 1603 Last data filed at 06/25/2019 1230 Gross per 24 hour  Intake 480 ml  Output 2114 ml  Net -1634 ml   Weight change:   Physical Exam: Gen: NAD, lying curled up in bed CVS:RRR no m/r/g Resp: clear bilaterally no c/w/r Abd: soft Ext: no LE edema NEURO: responds to questions but won't open her eyes ACCESS: R IJ TDC clean/dry/ intact, accessed LUE AVF + T/B Imaging: No results found.  Labs: BMET Recent Labs  Lab 06/21/19 2056 06/23/19 0442 06/23/19 1201 06/24/19 0619 06/25/19 0947  NA 132* 133* 135 134* 133*  K 3.6 3.9 3.5 4.0 3.9  CL 96* 98 97* 98 97*  CO2 23 22  --  20* 21*  GLUCOSE 103* 99 128* 129* 162*  BUN 44* 39* 14 27* 41*  CREATININE 6.12* 5.80* 2.60* 4.30* 5.90*  CALCIUM 8.2* 8.5*  --  8.5* 8.7*  PHOS 3.8  --   --   --  4.4   CBC Recent Labs  Lab 06/21/19 2056 06/23/19 0442 06/23/19 1201 06/24/19 0619 06/24/19 1453 06/25/19 0627  WBC 14.7* 14.7*  --  19.0*  --  18.5*  HGB 7.6* 8.0* 10.9* 6.8* 8.7* 8.7*  HCT 26.0* 27.0* 32.0* 23.5* 28.2* 28.8*  MCV 88.1 87.7   --  89.0  --  87.8  PLT 239 180  --  150  --  163    Medications:    . amLODipine  10 mg Oral Daily  . atorvastatin  40 mg Oral q1800  . Chlorhexidine Gluconate Cloth  6 each Topical Q0600  . Chlorhexidine Gluconate Cloth  6 each Topical Q0600  . darbepoetin (ARANESP) injection - DIALYSIS  150 mcg Intravenous Q Sat-HD  . gabapentin  200 mg Oral QHS  . hydrALAZINE  100 mg Oral Q8H  . labetalol  200 mg Oral BID  . levothyroxine  100 mcg Oral QAC breakfast      Madelon Lips, MD 06/25/2019, 4:03 PM

## 2019-06-26 LAB — GLUCOSE, CAPILLARY
Glucose-Capillary: 102 mg/dL — ABNORMAL HIGH (ref 70–99)
Glucose-Capillary: 130 mg/dL — ABNORMAL HIGH (ref 70–99)
Glucose-Capillary: 114 mg/dL — ABNORMAL HIGH (ref 70–99)
Glucose-Capillary: 116 mg/dL — ABNORMAL HIGH (ref 70–99)

## 2019-06-26 NOTE — Progress Notes (Signed)
[name], female   DOB: [birth year], 78 y.o.   MRN: [id number] BP (!) 147/57   Pulse 80   Temp 97.7 F (36.5 C)   Resp 18   Ht 5\' 3"  (1.6 m)   Wt 91 kg   SpO2 99%   BMI 35.54 kg/m  Alert and oriented Following commands Wound vac in place, afebrile abx per ID service. Doing well

## 2019-06-26 NOTE — Progress Notes (Signed)
  [facility] Progress Note    Assessment/ Plan:   1. ESRD, new this admission.  On THS schedule currently--> has TTS spot [place],  Maturing AVF. Next HD is Monday 11/23 d/t holiday schedule, will plan for second shift 2. Fever/ inc WBC: NSG and ID following, will need spinal washout-- had it 11/19, wound vac in place, cultures growing proteus, on cefepime for 6 weeks--> we can give this in dialysis and PICC contraindicated in ESRD 3. Anemia: IV Fe and ESA, CTM, transfuse prn 4. 2HPTH< stable 5. HTN: stable  6. Dispo: pending.  From a renal perspective can go anytime.    Subjective:    HD yesterday with 2L off.  No complaints this AM, listening to music   Objective:   BP (!) 138/57 (BP Location: Right Arm)   Pulse 85   Temp (!) 97.2 F (36.2 C)   Resp 20   Ht 5\' 3"  (1.6 m)   Wt 91 kg   SpO2 100%   BMI 35.54 kg/m   Intake/Output Summary (Last 24 hours) at 06/26/2019 1313 Last data filed at 06/26/2019 0300 Gross per 24 hour  Intake 830.33 ml  Output 125 ml  Net 705.33 ml   Weight change:   Physical Exam: Gen: NAD, lying flat in bed CVS:RRR no m/r/g Resp: clear bilaterally no c/w/r Abd: soft Ext: no LE edema NEURO: rAAO x 3 nonfocal ACCESS: R IJ TDC clean/dry/ intact, accessed LUE AVF + T/B Imaging: No results found.  Labs: BMET Recent Labs  Lab 06/21/19 2056 06/23/19 0442 06/23/19 1201 06/24/19 0619 06/25/19 0947  NA 132* 133* 135 134* 133*  K 3.6 3.9 3.5 4.0 3.9  CL 96* 98 97* 98 97*  CO2 23 22  --  20* 21*  GLUCOSE 103* 99 128* 129* 162*  BUN 44* 39* 14 27* 41*  CREATININE 6.12* 5.80* 2.60* 4.30* 5.90*  CALCIUM 8.2* 8.5*  --  8.5* 8.7*  PHOS 3.8  --   --   --  4.4   CBC Recent Labs  Lab 06/21/19 2056 06/23/19 0442 06/23/19 1201 06/24/19 0619 06/24/19 1453 06/25/19 0627  WBC 14.7* 14.7*  --  19.0*  --  18.5*  HGB 7.6* 8.0* 10.9* 6.8* 8.7* 8.7*  HCT 26.0* 27.0* 32.0* 23.5* 28.2* 28.8*  MCV 88.1 87.7  --  89.0  --   87.8  PLT 239 180  --  150  --  163    Medications:    . amLODipine  10 mg Oral Daily  . atorvastatin  40 mg Oral q1800  . Chlorhexidine Gluconate Cloth  6 each Topical Q0600  . Chlorhexidine Gluconate Cloth  6 each Topical Q0600  . darbepoetin (ARANESP) injection - DIALYSIS  150 mcg Intravenous Q Sat-HD  . gabapentin  200 mg Oral QHS  . hydrALAZINE  100 mg Oral Q8H  . labetalol  200 mg Oral BID  . levothyroxine  100 mcg Oral QAC breakfast      Taylor Lips, MD 06/26/2019, 1:13 PM

## 2019-06-26 NOTE — Progress Notes (Signed)
PROGRESS NOTE    Taylor Tyler  PPI:[id number] DOB: [birth year] DOA: 05/30/2019 PCP: McLean-Scocuzza, Nino Glow, MD    Brief Narrative:  78 yo with diabetes mellitus type 2 presently not on medication, chronic diastolic hypertension, chronic kidney disease stage III, anemia, hypothyroidism, hyperlipidemia.   She presented to the ED on 10/26 for dyspnea after a fall out of bed the night before and being on the floor for about 8 hrs. Her son found her in the AM. She was short of breath for at least 2 days.   In ED, noted to be wheezing. Suspected to have acute diastolic CHF and started on Lasix.  Also noted to have AKI with Cr of 5.74 and K of 5.3.  Past Cr 2.64.  As it was felt that her renal failure was permanent, she was eventually started on dialysis. It was planned to d/c her to SNF however, course complicated by leukocytosis and fevers on 11/11. See below  She was [facility] to [facility] on 11[facility]11.  Assessment & Plan:   Principal Problem:   Acute CHF (congestive heart failure) (HCC) Active Problems:   Hypothyroidism   DM (diabetes mellitus), type 2 with renal complications (HCC)   Anemia   Acute renal failure superimposed on stage 3 chronic kidney disease (HCC)   Essential hypertension   Pressure injury of skin   Fever  Principal Problem:  Acute diastolic CHF (congestive heart failure)   Acute on chronic kidney disease stage 4 >> ESRD Metabolic acidosis - despite medical management, she did not diurese well and her Cr continued to rise - 11/4 - tunneled dialysis cath placed and underwent dialysis - Pt is anticipated to be arranged for long-term HD - an outpt dialysis spot has been found- her schedule will be Tu/Th/Sat -Nephrology following. Per Nephrology can be d/c anytime from Nephrology standpoint -Remains stable. Planning for next HD on 11/23 for holiday schedule  Active Problems: Leukocytosis, fever > Sepsis from infected back wound - temp spike 103 on evening of 11/11  - blood cultures negative  - UA shows rare bacteria and > 50 wbc with yeast - Urine culture shows > 100 K yeast (Not UTI per ID)  - source likely a very small draining wound on lumbar spine related to lumbar fusion surgery in 2018 which has never healed- she has had an infection in the past s/p I and D and wound vac 11/19. Wound appeared re-infected - Neurosurgery consulted on 11/12 - ID consulted 11/12 - Initially continued on Vanc and Cefepime being given with dialysis - Neurosurgery following and pt now s/p lumbar wound debridement with hardware removal -Wound culture pos Proteus. ID initially recommended cefepime monotherapy for at least 6 weeks therapy. Discussed with pharmacy. Have narrowed to rocephin which can be continued on HD after discharge  Acute toxic metabolic encephalopathy with underlying dementia - Family has noted memory loss for about 4-5 yrs- she has had delulsions and paranoia for about 2 years - Remains stable this AM. Conversing appropriately this AM   Hypothyroidism  - cont Synthroid as pt tolerates  DM ruled out - last A1c was 5.2- not on medication at home - sugars remain stable  AOCD - appreciate management per renal  - Remains stable currently  HTN - on Norvasc, Labetalol, Hydralazine - BP stable at this time  HLD - Continued on Lipitor - Currently stable  Anemia of chronic disease - pt with hgb down to 6.8 - Had ordered 1 unit PRBC with post-transfusion hgb  of 8.7 - Hgb remains stable, reviewed. - Continue to follow CBC trends and transfuse as needed  Decubitus ulcers  Pressure Injury 06/06/19 Sacrum Left Stage I -  Intact skin with non-blanchable redness of a localized area usually over a bony prominence. (Active)  06/06/19 1010  Location: Sacrum  Location Orientation: Left  Staging: Stage I -  Intact skin with non-blanchable redness of a localized area usually over a bony prominence.  Wound Description (Comments):   Present on  Admission: No     Pressure Injury 06/12/19 Sacrum Left Stage II -  Partial thickness loss of dermis presenting as a shallow open ulcer with a red, pink wound bed without slough. Pink (Active)  06/12/19 2115  Location: Sacrum  Location Orientation: Left  Staging: Stage II -  Partial thickness loss of dermis presenting as a shallow open ulcer with a red, pink wound bed without slough.  Wound Description (Comments): Pink  Present on Admission:      Pressure Injury 06/15/19 Buttocks Left Stage II -  Partial thickness loss of dermis presenting as a shallow open ulcer with a red, pink wound bed without slough. 100 % pinkk (Active)  06/15/19 1102  Location: Buttocks  Location Orientation: Left  Staging: Stage II -  Partial thickness loss of dermis presenting as a shallow open ulcer with a red, pink wound bed without slough.  Wound Description (Comments): 100 % pinkk  Present on Admission: No   DVT prophylaxis: SCD's Code Status: Full Family Communication: Pt in room, family not at bedside Disposition Plan: SNF when bed available and repeat covid test confirmed neg  Consultants:   Nephrology  Vascular surgery  NS  ID  Procedures:   11/4 tunneled cath  11/19 Lumbar wound debridement and hardware removal  Antimicrobials: Anti-infectives (From admission, onward)   Start     Dose/Rate Route Frequency Ordered Stop   06/25/19 1800  cefTRIAXone (ROCEPHIN) 2 g in sodium chloride 0.9 % 100 mL IVPB     2 g 200 mL/hr over 30 Minutes Intravenous Every 24 hours 06/25/19 1446     06/23/19 1315  ceFEPIme (MAXIPIME) 2 g in sodium chloride 0.9 % 100 mL IVPB  Status:  Discontinued     2 g 200 mL/hr over 30 Minutes Intravenous To Surgery 06/23/19 1301 06/23/19 1624   06/23/19 1200  bacitracin 50,000 Units in sodium chloride 0.9 % 500 mL irrigation  Status:  Discontinued       As needed 06/23/19 1203 06/23/19 1359   06/16/19 1800  ceFEPIme (MAXIPIME) 2 g in sodium chloride 0.9 % 100 mL IVPB   Status:  Discontinued     2 g 200 mL/hr over 30 Minutes Intravenous Once per day on Tue Thu Sat 06/15/19 1904 06/25/19 1446   06/16/19 1200  vancomycin (VANCOCIN) IVPB 1000 mg/200 mL premix  Status:  Discontinued     1,000 mg 200 mL/hr over 60 Minutes Intravenous Every T-Th-Sa (Hemodialysis) 06/15/19 1904 06/24/19 1209   06/15/19 2030  vancomycin (VANCOCIN) 2,000 mg in sodium chloride 0.9 % 500 mL IVPB     2,000 mg 250 mL/hr over 120 Minutes Intravenous  Once 06/15/19 1902 06/16/19 0010   06/15/19 2000  ceFEPIme (MAXIPIME) 2 g in sodium chloride 0.9 % 100 mL IVPB     2 g 200 mL/hr over 30 Minutes Intravenous  Once 06/15/19 1902 06/15/19 2028   06/08/19 0600  cefUROXime (ZINACEF) 1.5 g in sodium chloride 0.9 % 100 mL IVPB  1.5 g 200 mL/hr over 30 Minutes Intravenous On call to O.R. 06/07/19 1519 06/08/19 1328      Subjective: No complaints this AM. Eager to go to SNF  Objective: Vitals:   06/25/19 2105 06/25/19 2326 06/26/19 0705 06/26/19 1253  BP: (!) 140/49 (!) 159/59 (!) 147/57 (!) 138/57  Pulse: 80 79 80 85  Resp:   18 20  Temp:  97.8 F (36.6 C) 97.7 F (36.5 C) (!) 97.2 F (36.2 C)  TempSrc:  Oral    SpO2:  99%  100%  Weight:      Height:        Intake/Output Summary (Last 24 hours) at 06/26/2019 1500 Last data filed at 06/26/2019 1300 Gross per 24 hour  Intake 1190.33 ml  Output 275 ml  Net 915.33 ml   Filed Weights   06/24/19 0500 06/25/19 0915 06/25/19 1230  Weight: 95 kg 93 kg 91 kg    Examination: General exam: Awake, laying in bed, in nad Respiratory system: Normal respiratory effort, no wheezing Cardiovascular system: regular rate, s1, s2 Gastrointestinal system: Soft, nondistended, positive BS Central nervous system: CN2-12 grossly intact, strength intact Extremities: Perfused, no clubbing Skin: Normal skin turgor, no notable skin lesions seen Psychiatry: Mood normal // no visual hallucinations   Data Reviewed: I have personally reviewed  following labs and imaging studies  CBC: Recent Labs  Lab 06/21/19 2056 06/23/19 0442 06/23/19 1201 06/24/19 0619 06/24/19 1453 06/25/19 0627  WBC 14.7* 14.7*  --  19.0*  --  18.5*  HGB 7.6* 8.0* 10.9* 6.8* 8.7* 8.7*  HCT 26.0* 27.0* 32.0* 23.5* 28.2* 28.8*  MCV 88.1 87.7  --  89.0  --  87.8  PLT 239 180  --  150  --  347   Basic Metabolic Panel: Recent Labs  Lab 06/21/19 2056 06/23/19 0442 06/23/19 1201 06/24/19 0619 06/25/19 0947  NA 132* 133* 135 134* 133*  K 3.6 3.9 3.5 4.0 3.9  CL 96* 98 97* 98 97*  CO2 23 22  --  20* 21*  GLUCOSE 103* 99 128* 129* 162*  BUN 44* 39* 14 27* 41*  CREATININE 6.12* 5.80* 2.60* 4.30* 5.90*  CALCIUM 8.2* 8.5*  --  8.5* 8.7*  PHOS 3.8  --   --   --  4.4   GFR: Estimated Creatinine Clearance: 8.7 mL/min (A) (by C-G formula based on SCr of 5.9 mg/dL (H)). Liver Function Tests: Recent Labs  Lab 06/21/19 2056 06/24/19 0619 06/25/19 0947  AST  --  18  --   ALT  --  15  --   ALKPHOS  --  65  --   BILITOT  --  0.6  --   PROT  --  6.9  --   ALBUMIN 1.7* 2.3* 2.3*   No results for input(s): LIPASE, AMYLASE in the last 168 hours. No results for input(s): AMMONIA in the last 168 hours. Coagulation Profile: No results for input(s): INR, PROTIME in the last 168 hours. Cardiac Enzymes: No results for input(s): CKTOTAL, CKMB, CKMBINDEX, TROPONINI in the last 168 hours. BNP (last 3 results) No results for input(s): PROBNP in the last 8760 hours. HbA1C: No results for input(s): HGBA1C in the last 72 hours. CBG: Recent Labs  Lab 06/25/19 0733 06/25/19 1610 06/25/19 2115 06/26/19 0738 06/26/19 1241  GLUCAP 97 121* 110* 102* 130*   Lipid Profile: No results for input(s): CHOL, HDL, LDLCALC, TRIG, CHOLHDL, LDLDIRECT in the last 72 hours. Thyroid Function Tests: No results for input(s):  TSH, T4TOTAL, FREET4, T3FREE, THYROIDAB in the last 72 hours. Anemia Panel: No results for input(s): VITAMINB12, FOLATE, FERRITIN, TIBC, IRON,  RETICCTPCT in the last 72 hours. Sepsis Labs: No results for input(s): PROCALCITON, LATICACIDVEN in the last 168 hours.  Recent Results (from the past 240 hour(s))  SARS CORONAVIRUS 2 (TAT 6-24 HRS)     Status: None   Collection Time: 06/23/19  5:40 AM  Result Value Ref Range Status   SARS Coronavirus 2 NEGATIVE NEGATIVE Final    Comment: (NOTE) SARS-CoV-2 target nucleic acids are NOT DETECTED. The SARS-CoV-2 RNA is generally detectable in upper and lower respiratory specimens during the acute phase of infection. Negative results do not preclude SARS-CoV-2 infection, do not rule out co-infections with other pathogens, and should not be used as the sole basis for treatment or other patient management decisions. Negative results must be combined with clinical observations, patient history, and epidemiological information. The expected result is Negative. Fact Sheet for Patients: SugarRoll.be Fact Sheet for Healthcare Providers: https://www.woods-mathews.com/ This test is not yet approved or cleared by the [place] FDA and  has been authorized for detection and/or diagnosis of SARS-CoV-2 by FDA under an Emergency Use Authorization (EUA). This EUA will remain  in effect (meaning this test can be used) for the duration of the COVID-19 declaration under Section 56 4(b)(1) of the Act, 21 U.S.C. section 360bbb-3(b)(1), unless the authorization is terminated or revoked sooner. Performed at [facility], [place] [address], [place], [place] [address]   Aerobic/Anaerobic Culture (surgical/deep wound)     Status: None (Preliminary result)   Collection Time: 06/23/19  1:02 PM   Specimen: Soft Tissue, Other  Result Value Ref Range Status   Specimen Description TISSUE LUMBAR  Final   Special Requests PATIENT ON FOLLOWING VANC  Final   Gram Stain   Final    MODERATE WBC PRESENT,BOTH PMN AND MONONUCLEAR NO ORGANISMS SEEN Performed at [facility], [address], [place], [place] [address]    Culture   Final    RARE PROTEUS MIRABILIS CRITICAL RESULT CALLED TO, READ BACK BY AND VERIFIED WITH: H. STONE, RN AT 1055 ON 06/24/19 BY C. JESSUP, MT. NO ANAEROBES ISOLATED; CULTURE IN PROGRESS FOR 5 DAYS    Report Status PENDING  Incomplete   Organism ID, Bacteria PROTEUS MIRABILIS  Final      Susceptibility   Proteus mirabilis - MIC*    AMPICILLIN <=2 SENSITIVE Sensitive     CEFAZOLIN <=4 SENSITIVE Sensitive     CEFEPIME <=1 SENSITIVE Sensitive     CEFTAZIDIME <=1 SENSITIVE Sensitive     CEFTRIAXONE <=1 SENSITIVE Sensitive     CIPROFLOXACIN <=0.25 SENSITIVE Sensitive     GENTAMICIN <=1 SENSITIVE Sensitive     IMIPENEM 8 INTERMEDIATE Intermediate     TRIMETH/SULFA <=20 SENSITIVE Sensitive     AMPICILLIN/SULBACTAM <=2 SENSITIVE Sensitive     PIP/TAZO <=4 SENSITIVE Sensitive     * RARE PROTEUS MIRABILIS     Radiology Studies: No results found.  Scheduled Meds: . amLODipine  10 mg Oral Daily  . atorvastatin  40 mg Oral q1800  . Chlorhexidine Gluconate Cloth  6 each Topical Q0600  . Chlorhexidine Gluconate Cloth  6 each Topical Q0600  . darbepoetin (ARANESP) injection - DIALYSIS  150 mcg Intravenous Q Sat-HD  . gabapentin  200 mg Oral QHS  . hydrALAZINE  100 mg Oral Q8H  . labetalol  200 mg Oral BID  . levothyroxine  100 mcg  Oral QAC breakfast   Continuous Infusions: . sodium chloride 10 mL/hr at 06/18/19 1746  . sodium chloride 10 mL/hr at 06/23/19 1205  . cefTRIAXone (ROCEPHIN)  IV 2 g (06/25/19 1709)     LOS: 27 days   Marylu Lund, MD [facility] Pager On Amion  If 7PM-7AM, please contact night-coverage 06/26/2019, 3:00 PM

## 2019-06-26 NOTE — TOC Progression Note (Signed)
Transition of Care [facility]) - Progression Note    Patient Details  Name: Taylor Tyler MRN: [id number] Date of Birth: 1941-04-20  Transition of Care [facility]) CM/SW [place], LCSW Phone Number: 06/26/2019, 8:17 AM  Clinical Narrative:  CSW received phone call from attending regarding patient being placed in a SNF. Per attending the specialty doctors have signed off and patient has been ready to transfer to a SNF. CSW reviewed chart and noted patient has been accepted to [facility] and an authorization has been attained. As of 06/21/2019 it appears facility has been ready to accept patient pending wound care needs being met. CSW reached out to [facility] and was informed their admissions person is off and will be back in the office Monday. CSW will need to follow up with their admissions Monday to begin transfer process.  Expected Discharge Plan: Skilled Nursing Facility Barriers to Discharge: Ship broker, Continued Medical Work up  Expected Discharge Plan and Services Expected Discharge Plan: [place] In-house Referral: NA Discharge Planning Services: CM Consult Post Acute Care Choice: [place] Living arrangements for the past 2 months: Single Family Home                 DME Arranged: (NA)         HH Arranged: RN, PT [place] Agency: [place] Date [facility] Agency Contacted: 06/10/19 Time [place]: 9470 Representative spoke with at [place]: cory   Social Determinants of Health ([place]) Interventions    Readmission Risk Interventions No flowsheet data found.

## 2019-06-27 LAB — RENAL FUNCTION PANEL
Albumin: 2.2 g/dL — ABNORMAL LOW (ref 3.5–5.0)
Anion gap: 14 (ref 5–15)
BUN: 40 mg/dL — ABNORMAL HIGH (ref 8–23)
CO2: 24 mmol/L (ref 22–32)
Calcium: 8.4 mg/dL — ABNORMAL LOW (ref 8.9–10.3)
Chloride: 93 mmol/L — ABNORMAL LOW (ref 98–111)
Creatinine, Ser: 5.96 mg/dL — ABNORMAL HIGH (ref 0.44–1.00)
GFR calc Af Amer: 7 mL/min — ABNORMAL LOW (ref >60)
GFR calc non Af Amer: 6 mL/min — ABNORMAL LOW (ref >60)
Glucose, Bld: 112 mg/dL — ABNORMAL HIGH (ref 70–99)
Phosphorus: 6.1 mg/dL — ABNORMAL HIGH (ref 2.5–4.6)
Potassium: 3.6 mmol/L (ref 3.5–5.1)
Sodium: 131 mmol/L — ABNORMAL LOW (ref 135–145)

## 2019-06-27 LAB — CBC
HCT: 30.3 % — ABNORMAL LOW (ref 36.0–46.0)
Hemoglobin: 9 g/dL — ABNORMAL LOW (ref 12.0–15.0)
MCH: 26.3 pg (ref 26.0–34.0)
MCHC: 29.7 g/dL — ABNORMAL LOW (ref 30.0–36.0)
MCV: 88.6 fL (ref 80.0–100.0)
Platelets: 123 10*3/uL — ABNORMAL LOW (ref 150–400)
RBC: 3.42 MIL/uL — ABNORMAL LOW (ref 3.87–5.11)
RDW: 17.2 % — ABNORMAL HIGH (ref 11.5–15.5)
WBC: 16.7 10*3/uL — ABNORMAL HIGH (ref 4.0–10.5)
nRBC: 1.2 % — ABNORMAL HIGH (ref 0.0–0.2)

## 2019-06-27 LAB — GLUCOSE, CAPILLARY
Glucose-Capillary: 94 mg/dL (ref 70–99)
Glucose-Capillary: 126 mg/dL — ABNORMAL HIGH (ref 70–99)
Glucose-Capillary: 106 mg/dL — ABNORMAL HIGH (ref 70–99)
Glucose-Capillary: 76 mg/dL (ref 70–99)

## 2019-06-27 LAB — SARS CORONAVIRUS 2 (TAT 6-24 HRS): SARS Coronavirus 2: NEGATIVE

## 2019-06-27 MED ORDER — HEPARIN SODIUM (PORCINE) 1000 UNIT/ML IJ SOLN
INTRAMUSCULAR | Status: AC
  Filled 2019-06-27: qty 4

## 2019-06-27 MED ORDER — CEFAZOLIN SODIUM-DEXTROSE 2-4 GM/100ML-% IV SOLN
2.0000 g | INTRAVENOUS | Status: DC
  Filled 2019-06-27: qty 100

## 2019-06-27 MED ORDER — CEFAZOLIN SODIUM-DEXTROSE 2-4 GM/100ML-% IV SOLN
2.0000 g | INTRAVENOUS | Status: DC
  Administered 2019-07-02 – 2019-07-07 (×3): 2 g via INTRAVENOUS
  Filled 2019-06-27 (×7): qty 100

## 2019-06-27 MED ORDER — HEPARIN SODIUM (PORCINE) 1000 UNIT/ML IJ SOLN
INTRAMUSCULAR | Status: AC
  Administered 2019-06-27: 3400 [IU]
  Filled 2019-06-27: qty 3

## 2019-06-27 MED ORDER — CEFAZOLIN SODIUM-DEXTROSE 2-4 GM/100ML-% IV SOLN
2.0000 g | INTRAVENOUS | Status: AC
  Administered 2019-06-27 – 2019-06-29 (×2): 2 g via INTRAVENOUS
  Filled 2019-06-27 (×2): qty 100

## 2019-06-27 NOTE — Progress Notes (Addendum)
  [facility] Progress Note    Assessment/ Plan:   1. ESRD, new this admission.  On THS schedule currently--> has TTS spot [place],  Maturing AVF.  - HD today 11/23 d/t holiday schedule - labs drawn and will follow-up  2. Fever/ inc WBC: NSG and ID following, will need spinal washout-- had it 11/19, wound vac in place, cultures growing proteus, s/p cefepime and now on ceftriaxone. --> note that cefepime can be given in outpatient dialysis and PICC contraindicated in ESRD.  Ceftriaxone not available with outpatient HD.  Per primary team - plan is for ancef 2 gram with each HD through 08/05/2019  3. Anemia: IV Fe and ESA, CTM, transfuse prn  4. 2HPTH< stable  5. HTN: controlled    6. Dispo: pending.  From a renal perspective can go anytime.    Subjective:    Had labs with HD this morning.  Seen and examined on HD and procedure supervised.  9:04 am with BP 144/64 and HR 71.  Tunneled catheter RIJ in use.  Tolerating well.   Review of systems: denies shortness of breath or chest pain; no n/v    Objective:   BP (!) 130/57 (BP Location: Right Arm)   Pulse 66   Temp 97.8 F (36.6 C) (Oral)   Resp 16   Ht 5\' 3"  (1.6 m)   Wt 91 kg   SpO2 100%   BMI 35.54 kg/m   Intake/Output Summary (Last 24 hours) at 06/27/2019 0853 Last data filed at 06/27/2019 0800 Gross per 24 hour  Intake 965 ml  Output 325 ml  Net 640 ml   Weight change:   Physical Exam: Gen: NAD, lying flat in bed in NAD CVS:RRR no m/r/g Resp: clear bilaterally no c/w/r Abd: soft NT Ext: no LE edema NEURO: awake and pleasant; follows commands ACCESS: R IJ TDC clean/dry/ intact; LUE AVF + T/B  Imaging: No results found.  Labs: BMET Recent Labs  Lab 06/21/19 2056 06/23/19 0442 06/23/19 1201 06/24/19 0619 06/25/19 0947  NA 132* 133* 135 134* 133*  K 3.6 3.9 3.5 4.0 3.9  CL 96* 98 97* 98 97*  CO2 23 22  --  20* 21*  GLUCOSE 103* 99 128* 129* 162*  BUN 44* 39* 14 27* 41*   CREATININE 6.12* 5.80* 2.60* 4.30* 5.90*  CALCIUM 8.2* 8.5*  --  8.5* 8.7*  PHOS 3.8  --   --   --  4.4   CBC Recent Labs  Lab 06/21/19 2056 06/23/19 0442 06/23/19 1201 06/24/19 0619 06/24/19 1453 06/25/19 0627  WBC 14.7* 14.7*  --  19.0*  --  18.5*  HGB 7.6* 8.0* 10.9* 6.8* 8.7* 8.7*  HCT 26.0* 27.0* 32.0* 23.5* 28.2* 28.8*  MCV 88.1 87.7  --  89.0  --  87.8  PLT 239 180  --  150  --  163    Medications:    . amLODipine  10 mg Oral Daily  . atorvastatin  40 mg Oral q1800  . Chlorhexidine Gluconate Cloth  6 each Topical Q0600  . Chlorhexidine Gluconate Cloth  6 each Topical Q0600  . darbepoetin (ARANESP) injection - DIALYSIS  150 mcg Intravenous Q Sat-HD  . gabapentin  200 mg Oral QHS  . hydrALAZINE  100 mg Oral Q8H  . labetalol  200 mg Oral BID  . levothyroxine  100 mcg Oral QAC breakfast    [name]  06/27/2019, 8:53 AM

## 2019-06-27 NOTE — TOC Progression Note (Signed)
Transition of Care [facility]) - Progression Note    Patient Details  Name: Taylor Tyler MRN: [id number] Date of Birth: 10-Nov-1940  Transition of Care [facility]) CM/SW [place], [place] Phone Number: 06/27/2019, 9:05 AM  Clinical Narrative:     CSW called [facility], Left voice message for Admissions coordinator return call.   Thurmond Butts, MSW, [facility] Clinical Social Worker [phone]   Expected Discharge Plan: Skilled Nursing Facility Barriers to Discharge: Insurance Authorization, Continued Medical Work up  Expected Discharge Plan and Services Expected Discharge Plan: [place] In-house Referral: NA Discharge Planning Services: CM Consult Post Acute Care Choice: [place] Living arrangements for the past 2 months: Single Family Home                 DME Arranged: (NA)         HH Arranged: RN, PT [place] Agency: [place] Date [facility] Agency Contacted: 06/10/19 Time [place]: 7680 Representative spoke with at [place]: cory   Social Determinants of Health ([place]) Interventions    Readmission Risk Interventions No flowsheet data found.

## 2019-06-27 NOTE — TOC Progression Note (Signed)
Transition of Care [facility]) - Progression Note    Patient Details  Name: JEILY GUTHRIDGE MRN: [id number] Date of Birth: 11/01/1940  Transition of Care [facility]) CM/SW [place], [place] Phone Number: 06/27/2019, 12:01 PM  Clinical Narrative:     CSW informed SNF, per HD coordinator, MWF schedule change was not available. [facility] confirmed they will still accept patient.   [place] has started Comptroller.   Thurmond Butts, MSW, [facility] Clinical Social Worker [phone]   Expected Discharge Plan: Skilled Nursing Facility Barriers to Discharge: Insurance Authorization, Continued Medical Work up  Expected Discharge Plan and Services Expected Discharge Plan: [place] In-house Referral: NA Discharge Planning Services: CM Consult Post Acute Care Choice: [place] Living arrangements for the past 2 months: Single Family Home                 DME Arranged: (NA)         HH Arranged: RN, PT [place] Agency: [place] Date [facility] Agency Contacted: 06/10/19 Time [place]: 1448 Representative spoke with at [place]: cory   Social Determinants of Health ([place]) Interventions    Readmission Risk Interventions No flowsheet data found.

## 2019-06-27 NOTE — Progress Notes (Signed)
Renal Navigator received call from [place] stating that SNF has requested a change in patient's HD schedule to MWF. Renal Navigator let CSW know that this is the second time the SNF has requested a change and that the OP HD clinic was able to accommodate their request the last time, but may not be able to change her schedule again. Renal Navigator spoke with Clinic Manager (CM)/B. Morris regarding request and let CSW know that clinic is not able to put patient on a MWF schedule per SNF's request because there is not one available.  Per Clinic CM, patient's son has still not gone to the clinic to sign patient's consents for treatment, which must happen before she can start treatment there. Renal Navigator spoke with patient's son who states he "hasn't made it a priority given everything else that has transpired." Renal Navigator stated understanding and again informed him that she will not be able to discharge when ready if these papers are not completed. Navigator reminded patient's son that the paperwork is good for 30 days. Patient's son states no one has notified him that his mother is ready for discharge. Renal Navigator explained that it appears that she is doing well and nearing discharge now. He reports that he will go sign papers.  Alphonzo Cruise, [place] Renal Navigator [phone]

## 2019-06-27 NOTE — Progress Notes (Signed)
  NEUROSURGERY PROGRESS NOTE   No issues overnight.  More clear this am No concerns  EXAM:  BP (!) 130/57 (BP Location: Right Arm)   Pulse 66   Temp 97.8 F (36.6 C) (Oral)   Resp 16   Ht 5\' 3"  (1.6 m)   Wt 91 kg   SpO2 100%   BMI 35.54 kg/m   Awake, alert MAEW. Generalized weakness, nonfocal Wound vac in place  IMPRESSION/PLAN 78 y.o. female pod #4 lumbar wound debridement, removal hardware, placement wound vac.   - No new NS recs. Cleared for d/c from NS perspective.  Will sign off. Patient can follow up in 3 weeks for monitoring  Please call for any concerns

## 2019-06-27 NOTE — Progress Notes (Signed)
ID Progress Note:   Taylor Tyler is a 78 y.o. 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   Wound vac in place.   Proteus from intraoperative cultures revealing sensitive to everything but Imipenem; will treat with Cefazolin 2 gm after HD x 6 weeks through 08/04/2019.  Will see her back in ID clinic around that time to ensure wound is closing up; may need consideration for oral antibiotic therapy. Will check ESR/CRP for treatment baseline so we can follow this through her therapy. If possible, would be helpful to manage infection if nephrology team can draw Q1-2 week CRP/ESR and fax to ID clinic @ [phone]. Thank you.   Disposition at D/C looks like will be SNF as recommended by therapy team.   Follow up appointment made with Terri Piedra, NP on 12/30 @ 1:45 p.m.. This visit may need to be done via telemedicine with SNF.    Janene Madeira, MSN, NP-C [facility] [email or web address] Pager: [phone] Office: [phone] [place]: [phone]

## 2019-06-27 NOTE — Progress Notes (Signed)
Physical Therapy Treatment Patient Details Name: Taylor Tyler MRN: [id number] DOB: 1941-06-15 Today's Date: 06/27/2019    History of Present Illness Pt is a 78 y/o female presenting after fall at home. Vented and found "Low probability for acute pulmonary embolism (10-19%)" and acute on chronic diastolic heart failure. Pt s/p hardware removal lumbar spine 06/23/19 with wound vac placement. PMH of DM2, HTN, CKD stage 3, anemia, non healing wound of low back after sx    PT Comments    Pt very lethargic after HD this morning limiting ability to participate. Pt requiring mod/maxA for sit to stand and was unable to complete std pvt transfer to [facility] or chair today. Pt remains appropriate for SNF upon d/c.    Follow Up Recommendations  SNF;Supervision for mobility/OOB     Equipment Recommendations  None recommended by PT    Recommendations for Other Services       Precautions / Restrictions Precautions Precautions: Fall Precaution Comments: wound vac, very lethargic Restrictions Weight Bearing Restrictions: No    Mobility  Bed Mobility Overal bed mobility: Needs Assistance Bed Mobility: Sidelying to Sit   Sidelying to sit: Mod assist   Sit to supine: Mod assist;+2 for physical assistance   General bed mobility comments: max directional verbal cues  Transfers Overall transfer level: Needs assistance Equipment used: Rolling walker (2 wheeled) Transfers: Sit to/from Stand Sit to Stand: Mod assist         General transfer comment: attempted to std pvt to [facility] however pt stated "I can't do it" and sat back down ont he bed. pt stood again for hygiene however pt unable to achieve full upright standing or maintain >5 sec  Ambulation/Gait         Gait velocity: dec   General Gait Details: unable to amb this date due to lethargy   Stairs             Wheelchair Mobility    Modified Rankin (Stroke Patients Only)       Balance Overall balance assessment: Needs  assistance Sitting-balance support: Feet supported;No upper extremity supported Sitting balance-Leahy Scale: Fair Sitting balance - Comments: pt sat EOB x 5 min with close min guard due to trying to return to supine while PT and RN were trying to change the bed   Standing balance support: During functional activity;Bilateral upper extremity supported Standing balance-Leahy Scale: Poor Standing balance comment: BUE support with RW and physical A for dynamic activities.                            Cognition Arousal/Alertness: Lethargic Behavior During Therapy: Flat affect Overall Cognitive Status: Impaired/Different from baseline Area of Impairment: Orientation;Attention;Memory;Following commands;Safety/judgement;Awareness;Problem solving                 Orientation Level: Disoriented to;Place;Time;Situation Current Attention Level: Sustained Memory: Decreased recall of precautions;Decreased short-term memory Following Commands: Follows one step commands with increased time;Follows one step commands inconsistently Safety/Judgement: Decreased awareness of safety;Decreased awareness of deficits Awareness: Intellectual Problem Solving: Slow processing;Difficulty sequencing;Requires verbal cues General Comments: pt very lethargic, had dialysis this morning, pt confused, stating she has to go to the bathroom "really bad" but then doesn't want to move., pt unaware she had bowel movement      Exercises      General Comments General comments (skin integrity, edema, etc.): pt with soft, liquid stool      Pertinent Vitals/Pain Pain Assessment: Faces Faces Pain  Scale: Hurts little more Pain Location: lower back Pain Descriptors / Indicators: Discomfort;Grimacing Pain Intervention(s): Monitored during session    Home Living                      Prior Function            PT Goals (current goals can now be found in the care plan section) Progress towards PT  goals: Not progressing toward goals - comment    Frequency    Min 2X/week      PT Plan Current plan remains appropriate    Co-evaluation              AM-PAC PT "6 Clicks" Mobility   Outcome Measure  Help needed turning from your back to your side while in a flat bed without using bedrails?: A Little Help needed moving from lying on your back to sitting on the side of a flat bed without using bedrails?: A Lot Help needed moving to and from a bed to a chair (including a wheelchair)?: A Lot Help needed standing up from a chair using your arms (e.g., wheelchair or bedside chair)?: A Lot Help needed to walk in hospital room?: A Lot Help needed climbing 3-5 steps with a railing? : Total 6 Click Score: 12    End of Session Equipment Utilized During Treatment: Gait belt Activity Tolerance: Patient limited by fatigue;Patient limited by lethargy Patient left: in bed(with 2 RNs to perform hygiene) Nurse Communication: Mobility status PT Visit Diagnosis: Other symptoms and signs involving the nervous system (R29.898)     Time: 1310-1330 PT Time Calculation (min) (ACUTE ONLY): 20 min  Charges:  $Therapeutic Activity: 8-22 mins                     Kittie Plater, PT, DPT Acute Rehabilitation Services Pager #: [phone] Office #: [phone]    [place] Lopes 06/27/2019, 2:29 PM

## 2019-06-27 NOTE — Progress Notes (Signed)
PT Cancellation Note  Patient Details Name: Taylor Tyler MRN: [id number] DOB: October 11, 1940   Cancelled Treatment:    Reason Eval/Treat Not Completed: Patient at procedure or test/unavailable. Pt at HD. PT to return as able to complete PT treatment.  Kittie Plater, PT, DPT Acute Rehabilitation Services Pager #: [phone] Office #: [phone]    [place] Lopes 06/27/2019, 8:59 AM

## 2019-06-27 NOTE — TOC Progression Note (Signed)
Transition of Care [facility]) - Progression Note    Patient Details  Name: Taylor Tyler MRN: [id number] Date of Birth: [birth year]  Transition of Care [name]) CM/SW [place], [place] Phone Number: 06/27/2019, 10:06 AM  Clinical Narrative:     CSW was informed by Neoma Laming with [facility], patient will need insurance reauthorization and she inquired if the patient HD days can be switched to MWF- because they no longer have any slots for T, Th, Sat. CSW called and left voice message with Jaclyn Shaggy, Renal Navigator, regarding SNF inquiry.  Thurmond Butts, MSW, [facility] Clinical Social Worker [phone]   Expected Discharge Plan: Skilled Nursing Facility Barriers to Discharge: Insurance Authorization, Continued Medical Work up  Expected Discharge Plan and Services Expected Discharge Plan: [place] In-house Referral: NA Discharge Planning Services: CM Consult Post Acute Care Choice: [place] Living arrangements for the past 2 months: Single Family Home                 DME Arranged: (NA)         HH Arranged: RN, PT [place] Agency: [place] Date [facility] Agency Contacted: 06/10/19 Time [place] 7: 1886 Representative spoke with at [place]: cory   Social Determinants of Health ([place]) Interventions    Readmission Risk Interventions No flowsheet data found.

## 2019-06-27 NOTE — Progress Notes (Signed)
PROGRESS NOTE    Taylor Tyler  MPN:[id number] DOB: [birth year] DOA: 05/30/2019 PCP: McLean-Scocuzza, Nino Glow, MD    Brief Narrative:  78 yo with diabetes mellitus type 2 presently not on medication, chronic diastolic hypertension, chronic kidney disease stage III, anemia, hypothyroidism, hyperlipidemia.   She presented to the ED on 10/26 for dyspnea after a fall out of bed the night before and being on the floor for about 8 hrs. Her son found her in the AM. She was short of breath for at least 2 days.   In ED, noted to be wheezing. Suspected to have acute diastolic CHF and started on Lasix.  Also noted to have AKI with Cr of 5.74 and K of 5.3.  Past Cr 2.64.  As it was felt that her renal failure was permanent, she was eventually started on dialysis. It was planned to d/c her to SNF however, course complicated by leukocytosis and fevers on 11/11. See below  She was transferred to [facility] on 11/11.  Assessment & Plan:   Principal Problem:   Acute CHF (congestive heart failure) (HCC) Active Problems:   Hypothyroidism   DM (diabetes mellitus), type 2 with renal complications (HCC)   Anemia   Acute renal failure superimposed on stage 3 chronic kidney disease (HCC)   Essential hypertension   Pressure injury of skin   Fever  Principal Problem:  Acute diastolic CHF (congestive heart failure)   Acute on chronic kidney disease stage 4 >> ESRD Metabolic acidosis - despite medical management, she did not diurese well and her Cr continued to rise - 11/4 - tunneled dialysis cath placed and underwent dialysis - Pt is anticipated to be arranged for long-term HD - an outpt dialysis spot has been found- her schedule will be Tu/Th/Sat -Nephrology following. Per Nephrology can be d/c anytime from Nephrology standpoint -Remains stable. Planning for next HD on 11/23 for holiday schedule. Stable at present  Active Problems: Leukocytosis, fever > Sepsis from infected back wound - temp spike 103  on evening of 11/11 - blood cultures negative  - UA shows rare bacteria and > 50 wbc with yeast - Urine culture shows > 100 K yeast (Not UTI per ID)  - source likely a very small draining wound on lumbar spine related to lumbar fusion surgery in 2018 which has never healed- she has had an infection in the past s/p I and D and wound vac 11/19. Wound appeared re-infected - Neurosurgery consulted on 11/12 - ID consulted 11/12 - Initially continued on Vanc and Cefepime being given with dialysis - Neurosurgery following and pt now s/p lumbar wound debridement with hardware removal -Wound culture pos Proteus. ID recommendations for cefazolin 2gm with HD through 12/31 - Pending SNF placement. Awaiting insurance authorization  Acute toxic metabolic encephalopathy with underlying dementia - Family has noted memory loss for about 4-5 yrs- she has had delulsions and paranoia for about 2 years - Much improved. Conversing very appropriately    Hypothyroidism  - cont Synthroid as pt tolerates - Stable at this time  DM ruled out - last A1c was 5.2- not on medication at home - sugars remain stable at this time  AOCD - appreciate management per renal  - Currently stable  HTN - on Norvasc, Labetalol, Hydralazine - BP stable at this time  HLD - Continued on Lipitor - Presently stable  Anemia of chronic disease - pt with hgb down to 6.8 - Had ordered 1 unit PRBC with post-transfusion hgb of 8.7 -  Hgb remains stable, reviewed. - Continue to follow CBC trends and transfuse as needed  Decubitus ulcers  Pressure Injury 06/06/19 Sacrum Left Stage I -  Intact skin with non-blanchable redness of a localized area usually over a bony prominence. (Active)  06/06/19 1010  Location: Sacrum  Location Orientation: Left  Staging: Stage I -  Intact skin with non-blanchable redness of a localized area usually over a bony prominence.  Wound Description (Comments):   Present on Admission: No      Pressure Injury 06/12/19 Sacrum Left Stage II -  Partial thickness loss of dermis presenting as a shallow open ulcer with a red, pink wound bed without slough. Pink (Active)  06/12/19 2115  Location: Sacrum  Location Orientation: Left  Staging: Stage II -  Partial thickness loss of dermis presenting as a shallow open ulcer with a red, pink wound bed without slough.  Wound Description (Comments): Pink  Present on Admission:      Pressure Injury 06/15/19 Buttocks Left Stage II -  Partial thickness loss of dermis presenting as a shallow open ulcer with a red, pink wound bed without slough. 100 % pinkk (Active)  06/15/19 1102  Location: Buttocks  Location Orientation: Left  Staging: Stage II -  Partial thickness loss of dermis presenting as a shallow open ulcer with a red, pink wound bed without slough.  Wound Description (Comments): 100 % pinkk  Present on Admission: No   DVT prophylaxis: SCD's Code Status: Full Family Communication: Pt in room, family not at bedside Disposition Plan: SNF when bed available and repeat covid test confirmed neg  Consultants:   Nephrology  Vascular surgery  NS  ID  Procedures:   11/4 tunneled cath  11/19 Lumbar wound debridement and hardware removal  Antimicrobials: Anti-infectives (From admission, onward)   Start     Dose/Rate Route Frequency Ordered Stop   07/02/19 1200  ceFAZolin (ANCEF) IVPB 2g/100 mL premix  Status:  Discontinued     2 g 200 mL/hr over 30 Minutes Intravenous Every T-Th-Sa (Hemodialysis) 06/27/19 0953 06/27/19 1001   07/02/19 1200  ceFAZolin (ANCEF) IVPB 2g/100 mL premix     2 g 200 mL/hr over 30 Minutes Intravenous Once per day on Tue Thu Sat 06/27/19 1001     06/27/19 1200  ceFAZolin (ANCEF) IVPB 2g/100 mL premix  Status:  Discontinued     2 g 200 mL/hr over 30 Minutes Intravenous Every M-W-F (Hemodialysis) 06/27/19 0953 06/27/19 1001   06/27/19 1200  ceFAZolin (ANCEF) IVPB 2g/100 mL premix     2 g 200 mL/hr over  30 Minutes Intravenous Once per day on Mon Wed Fri 06/27/19 1001 07/01/19 1159   06/25/19 1800  cefTRIAXone (ROCEPHIN) 2 g in sodium chloride 0.9 % 100 mL IVPB  Status:  Discontinued     2 g 200 mL/hr over 30 Minutes Intravenous Every 24 hours 06/25/19 1446 06/27/19 0953   06/23/19 1315  ceFEPIme (MAXIPIME) 2 g in sodium chloride 0.9 % 100 mL IVPB  Status:  Discontinued     2 g 200 mL/hr over 30 Minutes Intravenous To Surgery 06/23/19 1301 06/23/19 1624   06/23/19 1200  bacitracin 50,000 Units in sodium chloride 0.9 % 500 mL irrigation  Status:  Discontinued       As needed 06/23/19 1203 06/23/19 1359   06/16/19 1800  ceFEPIme (MAXIPIME) 2 g in sodium chloride 0.9 % 100 mL IVPB  Status:  Discontinued     2 g 200 mL/hr over 30 Minutes Intravenous  Once per day on Tue Thu Sat 06/15/19 1904 06/25/19 1446   06/16/19 1200  vancomycin (VANCOCIN) IVPB 1000 mg/200 mL premix  Status:  Discontinued     1,000 mg 200 mL/hr over 60 Minutes Intravenous Every T-Th-Sa (Hemodialysis) 06/15/19 1904 06/24/19 1209   06/15/19 2030  vancomycin (VANCOCIN) 2,000 mg in sodium chloride 0.9 % 500 mL IVPB     2,000 mg 250 mL/hr over 120 Minutes Intravenous  Once 06/15/19 1902 06/16/19 0010   06/15/19 2000  ceFEPIme (MAXIPIME) 2 g in sodium chloride 0.9 % 100 mL IVPB     2 g 200 mL/hr over 30 Minutes Intravenous  Once 06/15/19 1902 06/15/19 2028   06/08/19 0600  cefUROXime (ZINACEF) 1.5 g in sodium chloride 0.9 % 100 mL IVPB     1.5 g 200 mL/hr over 30 Minutes Intravenous On call to O.R. 06/07/19 1519 06/08/19 1328      Subjective: Very eager to go to SNF  Objective: Vitals:   06/27/19 1130 06/27/19 1157 06/27/19 1309 06/27/19 1551  BP: (!) 97/49 (!) 147/60  (!) 126/45  Pulse: 85 81  82  Resp:  19  20  Temp:  (!) 97.1 F (36.2 C) 97.9 F (36.6 C) 97.9 F (36.6 C)  TempSrc:  Oral Oral Oral  SpO2:  100%  99%  Weight:  92 kg    Height:        Intake/Output Summary (Last 24 hours) at 06/27/2019 1704  Last data filed at 06/27/2019 1157 Gross per 24 hour  Intake 605 ml  Output 775 ml  Net -170 ml   Filed Weights   06/25/19 1230 06/27/19 0845 06/27/19 1157  Weight: 91 kg 93 kg 92 kg    Examination: General exam: Conversant, in no acute distress Respiratory system: normal chest rise, clear, no audible wheezing Cardiovascular system: regular rhythm, s1-s2 Gastrointestinal system: Nondistended, nontender, pos BS Central nervous system: No seizures, no tremors Extremities: No cyanosis, no joint deformities Skin: No rashes, no pallor Psychiatry: Affect normal // no auditory hallucinations   Data Reviewed: I have personally reviewed following labs and imaging studies  CBC: Recent Labs  Lab 06/21/19 2056 06/23/19 0442 06/23/19 1201 06/24/19 0619 06/24/19 1453 06/25/19 0627 06/27/19 1015  WBC 14.7* 14.7*  --  19.0*  --  18.5* 16.7*  HGB 7.6* 8.0* 10.9* 6.8* 8.7* 8.7* 9.0*  HCT 26.0* 27.0* 32.0* 23.5* 28.2* 28.8* 30.3*  MCV 88.1 87.7  --  89.0  --  87.8 88.6  PLT 239 180  --  150  --  163 654*   Basic Metabolic Panel: Recent Labs  Lab 06/21/19 2056 06/23/19 0442 06/23/19 1201 06/24/19 0619 06/25/19 0947 06/27/19 1015  NA 132* 133* 135 134* 133* 131*  K 3.6 3.9 3.5 4.0 3.9 3.6  CL 96* 98 97* 98 97* 93*  CO2 23 22  --  20* 21* 24  GLUCOSE 103* 99 128* 129* 162* 112*  BUN 44* 39* 14 27* 41* 40*  CREATININE 6.12* 5.80* 2.60* 4.30* 5.90* 5.96*  CALCIUM 8.2* 8.5*  --  8.5* 8.7* 8.4*  PHOS 3.8  --   --   --  4.4 6.1*   GFR: Estimated Creatinine Clearance: 8.6 mL/min (A) (by C-G formula based on SCr of 5.96 mg/dL (H)). Liver Function Tests: Recent Labs  Lab 06/21/19 2056 06/24/19 0619 06/25/19 0947 06/27/19 1015  AST  --  18  --   --   ALT  --  15  --   --  ALKPHOS  --  65  --   --   BILITOT  --  0.6  --   --   PROT  --  6.9  --   --   ALBUMIN 1.7* 2.3* 2.3* 2.2*   No results for input(s): LIPASE, AMYLASE in the last 168 hours. No results for input(s):  AMMONIA in the last 168 hours. Coagulation Profile: No results for input(s): INR, PROTIME in the last 168 hours. Cardiac Enzymes: No results for input(s): CKTOTAL, CKMB, CKMBINDEX, TROPONINI in the last 168 hours. BNP (last 3 results) No results for input(s): PROBNP in the last 8760 hours. HbA1C: No results for input(s): HGBA1C in the last 72 hours. CBG: Recent Labs  Lab 06/26/19 1608 06/26/19 2109 06/27/19 0712 06/27/19 1301 06/27/19 1549  GLUCAP 114* 116* 94 126* 106*   Lipid Profile: No results for input(s): CHOL, HDL, LDLCALC, TRIG, CHOLHDL, LDLDIRECT in the last 72 hours. Thyroid Function Tests: No results for input(s): TSH, T4TOTAL, FREET4, T3FREE, THYROIDAB in the last 72 hours. Anemia Panel: No results for input(s): VITAMINB12, FOLATE, FERRITIN, TIBC, IRON, RETICCTPCT in the last 72 hours. Sepsis Labs: No results for input(s): PROCALCITON, LATICACIDVEN in the last 168 hours.  Recent Results (from the past 240 hour(s))  SARS CORONAVIRUS 2 (TAT 6-24 HRS)     Status: None   Collection Time: 06/23/19  5:40 AM  Result Value Ref Range Status   SARS Coronavirus 2 NEGATIVE NEGATIVE Final    Comment: (NOTE) SARS-CoV-2 target nucleic acids are NOT DETECTED. The SARS-CoV-2 RNA is generally detectable in upper and lower respiratory specimens during the acute phase of infection. Negative results do not preclude SARS-CoV-2 infection, do not rule out co-infections with other pathogens, and should not be used as the sole basis for treatment or other patient management decisions. Negative results must be combined with clinical observations, patient history, and epidemiological information. The expected result is Negative. Fact Sheet for Patients: SugarRoll.be Fact Sheet for Healthcare Providers: https://www.woods-mathews.com/ This test is not yet approved or cleared by the [place] FDA and  has been authorized for detection and/or  diagnosis of SARS-CoV-2 by FDA under an Emergency Use Authorization (EUA). This EUA will remain  in effect (meaning this test can be used) for the duration of the COVID-19 declaration under Section 56 4(b)(1) of the Act, 21 U.S.C. section 360bbb-3(b)(1), unless the authorization is terminated or revoked sooner. Performed at [facility], [place] [address], [place], [place] [address]   Aerobic/Anaerobic Culture (surgical/deep wound)     Status: None (Preliminary result)   Collection Time: 06/23/19  1:02 PM   Specimen: Soft Tissue, Other  Result Value Ref Range Status   Specimen Description TISSUE LUMBAR  Final   Special Requests PATIENT ON FOLLOWING VANC  Final   Gram Stain   Final    MODERATE WBC PRESENT,BOTH PMN AND MONONUCLEAR NO ORGANISMS SEEN Performed at [facility], [address] [place], [place] [address]    Culture   Final    RARE PROTEUS MIRABILIS CRITICAL RESULT CALLED TO, READ BACK BY AND VERIFIED WITH: H. STONE, RN AT 1055 ON 06/24/19 BY C. JESSUP, MT. NO ANAEROBES ISOLATED; CULTURE IN PROGRESS FOR 5 DAYS    Report Status PENDING  Incomplete   Organism ID, Bacteria PROTEUS MIRABILIS  Final      Susceptibility   Proteus mirabilis - MIC*    AMPICILLIN <=2 SENSITIVE Sensitive     CEFAZOLIN <=4 SENSITIVE Sensitive     CEFEPIME <=  1 SENSITIVE Sensitive     CEFTAZIDIME <=1 SENSITIVE Sensitive     CEFTRIAXONE <=1 SENSITIVE Sensitive     CIPROFLOXACIN <=0.25 SENSITIVE Sensitive     GENTAMICIN <=1 SENSITIVE Sensitive     IMIPENEM 8 INTERMEDIATE Intermediate     TRIMETH/SULFA <=20 SENSITIVE Sensitive     AMPICILLIN/SULBACTAM <=2 SENSITIVE Sensitive     PIP/TAZO <=4 SENSITIVE Sensitive     * RARE PROTEUS MIRABILIS  SARS CORONAVIRUS 2 (TAT 6-24 HRS) Nasopharyngeal Nasopharyngeal Swab     Status: None   Collection Time: 06/26/19  8:42 PM   Specimen: Nasopharyngeal Swab  Result Value Ref Range Status   SARS Coronavirus 2 NEGATIVE NEGATIVE Final    Comment:  (NOTE) SARS-CoV-2 target nucleic acids are NOT DETECTED. The SARS-CoV-2 RNA is generally detectable in upper and lower respiratory specimens during the acute phase of infection. Negative results do not preclude SARS-CoV-2 infection, do not rule out co-infections with other pathogens, and should not be used as the sole basis for treatment or other patient management decisions. Negative results must be combined with clinical observations, patient history, and epidemiological information. The expected result is Negative. Fact Sheet for Patients: SugarRoll.be Fact Sheet for Healthcare Providers: https://www.woods-mathews.com/ This test is not yet approved or cleared by the [place] FDA and  has been authorized for detection and/or diagnosis of SARS-CoV-2 by FDA under an Emergency Use Authorization (EUA). This EUA will remain  in effect (meaning this test can be used) for the duration of the COVID-19 declaration under Section 56 4(b)(1) of the Act, 21 U.S.C. section 360bbb-3(b)(1), unless the authorization is terminated or revoked sooner. Performed at [facility], [place] [address], [place], [place] [address]      Radiology Studies: No results found.  Scheduled Meds: . amLODipine  10 mg Oral Daily  . atorvastatin  40 mg Oral q1800  . Chlorhexidine Gluconate Cloth  6 each Topical Q0600  . Chlorhexidine Gluconate Cloth  6 each Topical Q0600  . darbepoetin (ARANESP) injection - DIALYSIS  150 mcg Intravenous Q Sat-HD  . gabapentin  200 mg Oral QHS  . hydrALAZINE  100 mg Oral Q8H  . labetalol  200 mg Oral BID  . levothyroxine  100 mcg Oral QAC breakfast   Continuous Infusions: . sodium chloride 10 mL/hr at 06/18/19 1746  . sodium chloride 10 mL/hr at 06/23/19 1205  .  ceFAZolin (ANCEF) IV 2 g (06/27/19 1346)  . [START ON 07/02/2019]  ceFAZolin (ANCEF) IV       LOS: 28 days   Marylu Lund, MD [facility] Pager On Amion   If 7PM-7AM, please contact night-coverage 06/27/2019, 5:04 PM

## 2019-06-28 LAB — BASIC METABOLIC PANEL
Anion gap: 15 (ref 5–15)
BUN: 24 mg/dL — ABNORMAL HIGH (ref 8–23)
CO2: 23 mmol/L (ref 22–32)
Calcium: 8.2 mg/dL — ABNORMAL LOW (ref 8.9–10.3)
Chloride: 97 mmol/L — ABNORMAL LOW (ref 98–111)
Creatinine, Ser: 3.92 mg/dL — ABNORMAL HIGH (ref 0.44–1.00)
GFR calc Af Amer: 12 mL/min — ABNORMAL LOW (ref >60)
GFR calc non Af Amer: 10 mL/min — ABNORMAL LOW (ref >60)
Glucose, Bld: 101 mg/dL — ABNORMAL HIGH (ref 70–99)
Potassium: 3.3 mmol/L — ABNORMAL LOW (ref 3.5–5.1)
Sodium: 135 mmol/L (ref 135–145)

## 2019-06-28 LAB — SEDIMENTATION RATE: Sed Rate: 65 mm/hr — ABNORMAL HIGH (ref 0–22)

## 2019-06-28 LAB — GLUCOSE, CAPILLARY
Glucose-Capillary: 109 mg/dL — ABNORMAL HIGH (ref 70–99)
Glucose-Capillary: 113 mg/dL — ABNORMAL HIGH (ref 70–99)
Glucose-Capillary: 165 mg/dL — ABNORMAL HIGH (ref 70–99)
Glucose-Capillary: 75 mg/dL (ref 70–99)
Glucose-Capillary: 87 mg/dL (ref 70–99)
Glucose-Capillary: 99 mg/dL (ref 70–99)

## 2019-06-28 LAB — AEROBIC/ANAEROBIC CULTURE W GRAM STAIN (SURGICAL/DEEP WOUND)

## 2019-06-28 LAB — C-REACTIVE PROTEIN: CRP: 11.3 mg/dL — ABNORMAL HIGH (ref <1.0)

## 2019-06-28 MED ORDER — POTASSIUM CHLORIDE CRYS ER 20 MEQ PO TBCR
40.0000 meq | EXTENDED_RELEASE_TABLET | Freq: Once | ORAL | Status: AC
  Administered 2019-06-28: 40 meq via ORAL
  Filled 2019-06-28: qty 2

## 2019-06-28 MED ORDER — CHLORHEXIDINE GLUCONATE CLOTH 2 % EX PADS
6.0000 | MEDICATED_PAD | Freq: Every day | CUTANEOUS | Status: DC
  Administered 2019-06-29 – 2019-06-30 (×2): 6 via TOPICAL

## 2019-06-28 MED ORDER — DOCUSATE SODIUM 100 MG PO CAPS
100.0000 mg | ORAL_CAPSULE | Freq: Two times a day (BID) | ORAL | Status: DC
  Administered 2019-06-28 – 2019-07-08 (×16): 100 mg via ORAL
  Filled 2019-06-28 (×17): qty 1

## 2019-06-28 MED ORDER — LACTULOSE 10 GM/15ML PO SOLN
20.0000 g | ORAL | Status: AC
  Administered 2019-06-28: 20 g via ORAL
  Filled 2019-06-28: qty 30

## 2019-06-28 MED ORDER — SEVELAMER CARBONATE 800 MG PO TABS
800.0000 mg | ORAL_TABLET | Freq: Three times a day (TID) | ORAL | Status: DC
  Administered 2019-06-28 – 2019-07-08 (×25): 800 mg via ORAL
  Filled 2019-06-28 (×22): qty 1

## 2019-06-28 MED ORDER — POTASSIUM CHLORIDE CRYS ER 10 MEQ PO TBCR: 10.0000 meq | EXTENDED_RELEASE_TABLET | Freq: Once | ORAL | Status: DC

## 2019-06-28 NOTE — TOC Progression Note (Signed)
Transition of Care [facility]) - Progression Note    Patient Details  Name: Taylor Tyler MRN: [id number] Date of Birth: 1940/11/09  Transition of Care [facility]) CM/SW [place], [place] Phone Number: 06/28/2019, 12:01 PM  Clinical Narrative:     Per SNF- insurance authorization remains pending.  Thurmond Butts, MSW, [place] [facility] Clinical Social Worker [phone]   Expected Discharge Plan: Skilled Nursing Facility Barriers to Discharge: Insurance Authorization  Expected Discharge Plan and Services Expected Discharge Plan: [place] In-house Referral: NA Discharge Planning Services: CM Consult Post Acute Care Choice: [place] Living arrangements for the past 2 months: Single Family Home                 DME Arranged: (NA)         HH Arranged: RN, PT [place] Agency: [place] [facility] Agency Contacted: 06/10/19 Time [place]: [id number] Representative spoke with at [place]: cory   Social Determinants of Health ([place]) Interventions    Readmission Risk Interventions No flowsheet data found.

## 2019-06-28 NOTE — Progress Notes (Signed)
Occupational Therapy Treatment Patient Details Name: Taylor Tyler MRN: [id number] DOB: 1940/08/27 Today's Date: 06/28/2019    History of present illness Pt is a 78 y/o female presenting after fall at home. Vented and found "Low probability for acute pulmonary embolism (10-19%)" and acute on chronic diastolic heart failure. Pt s/p hardware removal lumbar spine 06/23/19 with wound vac placement. PMH of DM2, HTN, CKD stage 3, anemia, non healing wound of low back after sx   OT comments  Pt making minimal progress towards OT goals this session. Session limited d/t pt reporting she had a BM in bed. Pt required MOD- MAX A for bed mobility to assist with posterior pericare. Attempted sitting EOB with pt needing max multimodal cues to sequence log roll sequence. Once pt positioned in side lying pt noted to be having another BM. Assisted pt back to supine in prep for clean up.  DC plan remains appropriate, will continue to follow acutely per POC.   Follow Up Recommendations  SNF;Supervision/Assistance - 24 hour    Equipment Recommendations  None recommended by OT    Recommendations for Other Services      Precautions / Restrictions Precautions Precautions: Fall Precaution Comments: wound vac, continous BM Restrictions Weight Bearing Restrictions: No       Mobility Bed Mobility Overal bed mobility: Needs Assistance Bed Mobility: Rolling;Sidelying to Sit Rolling: Mod assist;+2 for physical assistance Sidelying to sit: Max assist       General bed mobility comments: pt required MOD A to roll during pericare using manual assist from therapist/ RN rather than using rail. MOD A to maintan sidelying position for clean up. Attempted sitting EUB; pt required MAX for sequencing sidelying>sit as pt bring one LE back to bed once positioned correctly. After positoning pt in prep to elevate trunk pt noted to be having another BM  Transfers                 General transfer comment: unable to  complete    Balance                                           ADL either performed or assessed with clinical judgement   ADL Overall ADL's : Needs assistance/impaired             Lower Body Bathing: Total assistance;Bed level Lower Body Bathing Details (indicate cue type and reason): total A for LB bathing after incontinent BM           Toilet Transfer Details (indicate cue type and reason): unable to perfrom as pt continously having BM Toileting- Clothing Manipulation and Hygiene: Maximal assistance;Moderate assistance;+2 for physical assistance Toileting - Clothing Manipulation Details (indicate cue type and reason): total - MAX A for hygiene after incontinent BM; MOD A +2 to roll and maintain sidelying for clean up; MAX A for posterior pericare       General ADL Comments: session limited to bed mobility d/t pt continously having BM; MOD- MAX A +1 for bed mobility and pericare     Vision Baseline Vision/History: No visual deficits Vision Assessment?: No apparent visual deficits   Perception     Praxis      Cognition Arousal/Alertness: Awake/alert Behavior During Therapy: Flat affect Overall Cognitive Status: Impaired/Different from baseline Area of Impairment: Orientation;Attention;Memory;Following commands;Safety/judgement;Awareness;Problem solving  Orientation Level: Disoriented to;Time Current Attention Level: Sustained Memory: Decreased short-term memory;Decreased recall of precautions Following Commands: Follows one step commands with increased time;Follows one step commands inconsistently Safety/Judgement: Decreased awareness of safety;Decreased awareness of deficits Awareness: Intellectual Problem Solving: Slow processing;Difficulty sequencing;Requires verbal cues General Comments: attempted sidelying >sit however pt unable to sequence advancing feet to EOB depsite HOH assist, once positioned correctly with MAX A pt  noted to again be having BM. Pt disoriented to year and date. Pts judgement/ safety seem to be impaired as pt reports to COTA that she had a BM upon arrival but did not want to get cleaned up        Exercises     Shoulder Instructions       General Comments      Pertinent Vitals/ Pain       Pain Assessment: No/denies pain  Home Living                                          Prior Functioning/Environment              Frequency           Progress Toward Goals  OT Goals(current goals can now be found in the care plan section)  Progress towards OT goals: Progressing toward goals  Acute Rehab OT Goals Patient Stated Goal: pt not able to state OT Goal Formulation: With patient Time For Goal Achievement: 07/08/19 Potential to Achieve Goals: [place] Discharge plan remains appropriate    Co-evaluation                 AM-PAC OT "6 Clicks" Daily Activity     Outcome Measure   Help from another person eating meals?: A Little Help from another person taking care of personal grooming?: A Lot Help from another person toileting, which includes using toliet, bedpan, or urinal?: A Lot Help from another person bathing (including washing, rinsing, drying)?: A Lot Help from another person to put on and taking off regular upper body clothing?: A Little Help from another person to put on and taking off regular lower body clothing?: A Lot 6 Click Score: 14    End of Session    OT Visit Diagnosis: Unsteadiness on feet (R26.81);Other symptoms and signs involving cognitive function;Muscle weakness (generalized) (M62.81)   Activity Tolerance Other (comment)(limited by incontinent BM)   Patient Left in bed;with call bell/phone within reach   Nurse Communication Mobility status        Time: 8270-7867 OT Time Calculation (min): 24 min  Charges: OT General Charges $OT Visit: 1 Visit OT Treatments $Self Care/Home Management : 23-37 mins  Lanier Clam., COTA/L Acute Rehabilitation Services [phone]    Taylor Tyler 06/28/2019, 3:40 PM

## 2019-06-28 NOTE — Progress Notes (Signed)
[facility] Progress Note    Assessment/ Plan:   1. ESRD, new this admission.  On THS schedule currently--> has TTS spot [place],  Maturing AVF.  Had HD on 11/23 with holiday schedule  - next HD on 11/24 per holiday schedule then resume TTS schedule after that  2. Sepsis from infected back wound - NSG and ID following, will need spinal washout-- had it 11/19, wound vac in place, cultures growing proteus, s/p cefepime and now on ancef. --> Per primary team - plan is for ancef 2 gram with each HD through 08/05/2019  3. Anemia secondary to ESRD - on ESA, transfuse prn   4. 2HPTH< hyperphos - start binder - renvela    5. HTN: controlled    6. Dispo: pending.  From a renal perspective can go anytime.    Subjective:    awaiting insurance authorization for SNF - had HD yesterday and states no dizziness or cramping.  She got potassium this morning.   Review of systems: denies shortness of breath or chest pain; no n/v     Objective:   BP 134/64 (BP Location: Right Arm)   Pulse 69   Temp 98.2 F (36.8 C) (Oral)   Resp 18   Ht 5\' 3"  (1.6 m)   Wt 92 kg   SpO2 100%   BMI 35.93 kg/m   Intake/Output Summary (Last 24 hours) at 06/28/2019 1333 Last data filed at 06/28/2019 0932 Gross per 24 hour  Intake 340 ml  Output 110 ml  Net 230 ml   Weight change:   Physical Exam:  Gen: NAD, lying flat in bed in NAD CVS:RRR no m/r/g Resp: clear bilaterally no c/w/r Abd: soft NT Ext: no LE edema NEURO: awake and pleasant; follows commands ACCESS: R IJ TDC clean/dry/ intact; LUE AVF + T/B  Imaging: No results found.  Labs: BMET Recent Labs  Lab 06/21/19 2056 06/23/19 0442 06/23/19 1201 06/24/19 0619 06/25/19 0947 06/27/19 1015 06/28/19 0315  NA 132* 133* 135 134* 133* 131* 135  K 3.6 3.9 3.5 4.0 3.9 3.6 3.3*  CL 96* 98 97* 98 97* 93* 97*  CO2 23 22  --  20* 21* 24 23  GLUCOSE 103* 99 128* 129* 162* 112* 101*  BUN 44* 39* 14 27* 41* 40* 24*  CREATININE  6.12* 5.80* 2.60* 4.30* 5.90* 5.96* 3.92*  CALCIUM 8.2* 8.5*  --  8.5* 8.7* 8.4* 8.2*  PHOS 3.8  --   --   --  4.4 6.1*  --    CBC Recent Labs  Lab 06/23/19 0442  06/24/19 0619 06/24/19 1453 06/25/19 0627 06/27/19 1015  WBC 14.7*  --  19.0*  --  18.5* 16.7*  HGB 8.0*   < > 6.8* 8.7* 8.7* 9.0*  HCT 27.0*   < > 23.5* 28.2* 28.8* 30.3*  MCV 87.7  --  89.0  --  87.8 88.6  PLT 180  --  150  --  163 123*   < > = values in this interval not displayed.    Medications:    . amLODipine  10 mg Oral Daily  . atorvastatin  40 mg Oral q1800  . Chlorhexidine Gluconate Cloth  6 each Topical Q0600  . Chlorhexidine Gluconate Cloth  6 each Topical Q0600  . darbepoetin (ARANESP) injection - DIALYSIS  150 mcg Intravenous Q Sat-HD  . docusate sodium  100 mg Oral BID  . gabapentin  200 mg Oral QHS  . hydrALAZINE  100 mg Oral  Q8H  . labetalol  200 mg Oral BID  . levothyroxine  100 mcg Oral QAC breakfast    [name]  06/28/2019, 1:33 PM

## 2019-06-28 NOTE — Progress Notes (Signed)
PROGRESS NOTE    Taylor Tyler  KWI:[id number] DOB: September 19, 1940 DOA: 05/30/2019 PCP: McLean-Scocuzza, Nino Glow, MD    Brief Narrative:  78 yo with diabetes mellitus type 2 presently not on medication, chronic diastolic hypertension, chronic kidney disease stage III, anemia, hypothyroidism, hyperlipidemia.   She presented to the ED on 10/26 for dyspnea after a fall out of bed the night before and being on the floor for about 8 hrs. Her son found her in the AM. She was short of breath for at least 2 days.   In ED, noted to be wheezing. Suspected to have acute diastolic CHF and started on Lasix.  Also noted to have AKI with Cr of 5.74 and K of 5.3.  Past Cr 2.64.  As it was felt that her renal failure was permanent, she was eventually started on dialysis. It was planned to d/c her to SNF however, course complicated by leukocytosis and fevers on 11/11. See below  She was transferred to [facility] on 11/11.  Assessment & Plan:   Principal Problem:   Acute CHF (congestive heart failure) (HCC) Active Problems:   Hypothyroidism   DM (diabetes mellitus), type 2 with renal complications (HCC)   Anemia   Acute renal failure superimposed on stage 3 chronic kidney disease (HCC)   Essential hypertension   Pressure injury of skin   Fever  Principal Problem:  Acute diastolic CHF (congestive heart failure)   Acute on chronic kidney disease stage 4 >> ESRD Metabolic acidosis - despite medical management, she did not diurese well and her Cr continued to rise - 11/4 - tunneled dialysis cath placed and underwent dialysis - Pt is anticipated to be arranged for long-term HD - an outpt dialysis spot has been found- her schedule will be Tu/Th/Sat -Nephrology following. Per Nephrology can be d/c anytime from Nephrology standpoint -Remains stable. Continuing with HD per Nephrology  Active Problems: Leukocytosis, fever > Sepsis from infected back wound - temp spike 103 on evening of 11/11 - blood cultures  negative  - UA shows rare bacteria and > 50 wbc with yeast - Urine culture shows > 100 K yeast (Not UTI per ID)  - source likely a very small draining wound on lumbar spine related to lumbar fusion surgery in 2018 which has never healed- she has had an infection in the past s/p I and D and wound vac 11/19. Wound appeared re-infected - Neurosurgery consulted on 11/12 - ID consulted 11/12 - Initially continued on Vanc and Cefepime being given with dialysis - Neurosurgery following and pt now s/p lumbar wound debridement with hardware removal -Wound culture pos Proteus. ID recommendations for cefazolin 2gm with HD through 12/31 - Pending SNF placement. Still awaiting insurance authorization  Acute toxic metabolic encephalopathy with underlying dementia - Family has noted memory loss for about 4-5 yrs- she has had delulsions and paranoia for about 2 years - Much improved. Conversing very appropriately    Hypothyroidism  - cont Synthroid as pt tolerates - Stable at this time  DM ruled out - last A1c was 5.2- not on medication at home - sugars remain stable at this time  AOCD - appreciate management per renal  - Currently stable  HTN - on Norvasc, Labetalol, Hydralazine - BP stable at this time  HLD - Continued on Lipitor - Presently stable  Anemia of chronic disease - pt with hgb down to 6.8 - Had ordered 1 unit PRBC with post-transfusion hgb of 8.7 - Hgb remains stable, reviewed. -  Continue to follow CBC trends and transfuse as needed  Decubitus ulcers  Pressure Injury 06/06/19 Sacrum Left Stage I -  Intact skin with non-blanchable redness of a localized area usually over a bony prominence. (Active)  06/06/19 1010  Location: Sacrum  Location Orientation: Left  Staging: Stage I -  Intact skin with non-blanchable redness of a localized area usually over a bony prominence.  Wound Description (Comments):   Present on Admission: No     Pressure Injury 06/12/19 Sacrum  Left Stage II -  Partial thickness loss of dermis presenting as a shallow open ulcer with a red, pink wound bed without slough. Pink (Active)  06/12/19 2115  Location: Sacrum  Location Orientation: Left  Staging: Stage II -  Partial thickness loss of dermis presenting as a shallow open ulcer with a red, pink wound bed without slough.  Wound Description (Comments): Pink  Present on Admission:      Pressure Injury 06/15/19 Buttocks Left Stage II -  Partial thickness loss of dermis presenting as a shallow open ulcer with a red, pink wound bed without slough. 100 % pinkk (Active)  06/15/19 1102  Location: Buttocks  Location Orientation: Left  Staging: Stage II -  Partial thickness loss of dermis presenting as a shallow open ulcer with a red, pink wound bed without slough.  Wound Description (Comments): 100 % pinkk  Present on Admission: No   DVT prophylaxis: SCD's Code Status: Full Family Communication: Pt in room, family not at bedside Disposition Plan: SNF when bed available and repeat covid test confirmed neg  Consultants:   Nephrology  Vascular surgery  NS  ID  Procedures:   11/4 tunneled cath  11/19 Lumbar wound debridement and hardware removal  Antimicrobials: Anti-infectives (From admission, onward)   Start     Dose/Rate Route Frequency Ordered Stop   07/02/19 1200  ceFAZolin (ANCEF) IVPB 2g/100 mL premix  Status:  Discontinued     2 g 200 mL/hr over 30 Minutes Intravenous Every T-Th-Sa (Hemodialysis) 06/27/19 0953 06/27/19 1001   07/02/19 1200  ceFAZolin (ANCEF) IVPB 2g/100 mL premix     2 g 200 mL/hr over 30 Minutes Intravenous Once per day on Tue Thu Sat 06/27/19 1001     06/27/19 1200  ceFAZolin (ANCEF) IVPB 2g/100 mL premix  Status:  Discontinued     2 g 200 mL/hr over 30 Minutes Intravenous Every M-W-F (Hemodialysis) 06/27/19 0953 06/27/19 1001   06/27/19 1200  ceFAZolin (ANCEF) IVPB 2g/100 mL premix     2 g 200 mL/hr over 30 Minutes Intravenous Once per  day on Mon Wed Fri 06/27/19 1001 07/01/19 1159   06/25/19 1800  cefTRIAXone (ROCEPHIN) 2 g in sodium chloride 0.9 % 100 mL IVPB  Status:  Discontinued     2 g 200 mL/hr over 30 Minutes Intravenous Every 24 hours 06/25/19 1446 06/27/19 0953   06/23/19 1315  ceFEPIme (MAXIPIME) 2 g in sodium chloride 0.9 % 100 mL IVPB  Status:  Discontinued     2 g 200 mL/hr over 30 Minutes Intravenous To Surgery 06/23/19 1301 06/23/19 1624   06/23/19 1200  bacitracin 50,000 Units in sodium chloride 0.9 % 500 mL irrigation  Status:  Discontinued       As needed 06/23/19 1203 06/23/19 1359   06/16/19 1800  ceFEPIme (MAXIPIME) 2 g in sodium chloride 0.9 % 100 mL IVPB  Status:  Discontinued     2 g 200 mL/hr over 30 Minutes Intravenous Once per day on Tue  Thu Sat 06/15/19 1904 06/25/19 1446   06/16/19 1200  vancomycin (VANCOCIN) IVPB 1000 mg/200 mL premix  Status:  Discontinued     1,000 mg 200 mL/hr over 60 Minutes Intravenous Every T-Th-Sa (Hemodialysis) 06/15/19 1904 06/24/19 1209   06/15/19 2030  vancomycin (VANCOCIN) 2,000 mg in sodium chloride 0.9 % 500 mL IVPB     2,000 mg 250 mL/hr over 120 Minutes Intravenous  Once 06/15/19 1902 06/16/19 0010   06/15/19 2000  ceFEPIme (MAXIPIME) 2 g in sodium chloride 0.9 % 100 mL IVPB     2 g 200 mL/hr over 30 Minutes Intravenous  Once 06/15/19 1902 06/15/19 2028   06/08/19 0600  cefUROXime (ZINACEF) 1.5 g in sodium chloride 0.9 % 100 mL IVPB     1.5 g 200 mL/hr over 30 Minutes Intravenous On call to O.R. 06/07/19 1519 06/08/19 1328      Subjective: Looking forward to SNF placement  Objective: Vitals:   06/27/19 2302 06/28/19 0340 06/28/19 0728 06/28/19 1115  BP: (!) 176/54 (!) 168/53 (!) 175/55 134/64  Pulse: 77 74 74 69  Resp: 16 17 16 18   Temp: (!) 97.5 F (36.4 C) 98.4 F (36.9 C) 98 F (36.7 C) 98.2 F (36.8 C)  TempSrc: Oral Oral Oral Oral  SpO2: 100% 100% 100% 100%  Weight:      Height:        Intake/Output Summary (Last 24 hours) at  06/28/2019 1525 Last data filed at 06/28/2019 0932 Gross per 24 hour  Intake 340 ml  Output 110 ml  Net 230 ml   Filed Weights   06/25/19 1230 06/27/19 0845 06/27/19 1157  Weight: 91 kg 93 kg 92 kg    Examination: General exam: Awake, laying in bed, in nad Respiratory system: Normal respiratory effort, no wheezing  Data Reviewed: I have personally reviewed following labs and imaging studies  CBC: Recent Labs  Lab 06/21/19 2056 06/23/19 0442 06/23/19 1201 06/24/19 0619 06/24/19 1453 06/25/19 0627 06/27/19 1015  WBC 14.7* 14.7*  --  19.0*  --  18.5* 16.7*  HGB 7.6* 8.0* 10.9* 6.8* 8.7* 8.7* 9.0*  HCT 26.0* 27.0* 32.0* 23.5* 28.2* 28.8* 30.3*  MCV 88.1 87.7  --  89.0  --  87.8 88.6  PLT 239 180  --  150  --  163 784*   Basic Metabolic Panel: Recent Labs  Lab 06/21/19 2056 06/23/19 0442 06/23/19 1201 06/24/19 0619 06/25/19 0947 06/27/19 1015 06/28/19 0315  NA 132* 133* 135 134* 133* 131* 135  K 3.6 3.9 3.5 4.0 3.9 3.6 3.3*  CL 96* 98 97* 98 97* 93* 97*  CO2 23 22  --  20* 21* 24 23  GLUCOSE 103* 99 128* 129* 162* 112* 101*  BUN 44* 39* 14 27* 41* 40* 24*  CREATININE 6.12* 5.80* 2.60* 4.30* 5.90* 5.96* 3.92*  CALCIUM 8.2* 8.5*  --  8.5* 8.7* 8.4* 8.2*  PHOS 3.8  --   --   --  4.4 6.1*  --    GFR: Estimated Creatinine Clearance: 13.1 mL/min (A) (by C-G formula based on SCr of 3.92 mg/dL (H)). Liver Function Tests: Recent Labs  Lab 06/21/19 2056 06/24/19 0619 06/25/19 0947 06/27/19 1015  AST  --  18  --   --   ALT  --  15  --   --   ALKPHOS  --  65  --   --   BILITOT  --  0.6  --   --   PROT  --  6.9  --   --   ALBUMIN 1.7* 2.3* 2.3* 2.2*   No results for input(s): LIPASE, AMYLASE in the last 168 hours. No results for input(s): AMMONIA in the last 168 hours. Coagulation Profile: No results for input(s): INR, PROTIME in the last 168 hours. Cardiac Enzymes: No results for input(s): CKTOTAL, CKMB, CKMBINDEX, TROPONINI in the last 168 hours. BNP (last  3 results) No results for input(s): PROBNP in the last 8760 hours. HbA1C: No results for input(s): HGBA1C in the last 72 hours. CBG: Recent Labs  Lab 06/27/19 2259 06/28/19 0026 06/28/19 0338 06/28/19 0727 06/28/19 1106  GLUCAP 76 75 99 87 165*   Lipid Profile: No results for input(s): CHOL, HDL, LDLCALC, TRIG, CHOLHDL, LDLDIRECT in the last 72 hours. Thyroid Function Tests: No results for input(s): TSH, T4TOTAL, FREET4, T3FREE, THYROIDAB in the last 72 hours. Anemia Panel: No results for input(s): VITAMINB12, FOLATE, FERRITIN, TIBC, IRON, RETICCTPCT in the last 72 hours. Sepsis Labs: No results for input(s): PROCALCITON, LATICACIDVEN in the last 168 hours.  Recent Results (from the past 240 hour(s))  SARS CORONAVIRUS 2 (TAT 6-24 HRS)     Status: None   Collection Time: 06/23/19  5:40 AM  Result Value Ref Range Status   SARS Coronavirus 2 NEGATIVE NEGATIVE Final    Comment: (NOTE) SARS-CoV-2 target nucleic acids are NOT DETECTED. The SARS-CoV-2 RNA is generally detectable in upper and lower respiratory specimens during the acute phase of infection. Negative results do not preclude SARS-CoV-2 infection, do not rule out co-infections with other pathogens, and should not be used as the sole basis for treatment or other patient management decisions. Negative results must be combined with clinical observations, patient history, and epidemiological information. The expected result is Negative. Fact Sheet for Patients: SugarRoll.be Fact Sheet for Healthcare Providers: https://www.woods-mathews.com/ This test is not yet approved or cleared by the [place] FDA and  has been authorized for detection and/or diagnosis of SARS-CoV-2 by FDA under an Emergency Use Authorization (EUA). This EUA will remain  in effect (meaning this test can be used) for the duration of the COVID-19 declaration under Section 56 4(b)(1) of the Act, 21  U.S.C. section 360bbb-3(b)(1), unless the authorization is terminated or revoked sooner. Performed at [facility], [place] [address] [place], [place] [address]   Aerobic/Anaerobic Culture (surgical/deep wound)     Status: None   Collection Time: 06/23/19  1:02 PM   Specimen: Soft Tissue, Other  Result Value Ref Range Status   Specimen Description TISSUE LUMBAR  Final   Special Requests PATIENT ON FOLLOWING VANC  Final   Gram Stain   Final    MODERATE WBC PRESENT,BOTH PMN AND MONONUCLEAR NO ORGANISMS SEEN    Culture   Final    RARE PROTEUS MIRABILIS CRITICAL RESULT CALLED TO, READ BACK BY AND VERIFIED WITH: H. STONE, RN AT 1055 ON 06/24/19 BY C. JESSUP, MT. NO ANAEROBES ISOLATED Performed at [facility], [place] [address], [place], [place] [address]    Report Status 06/28/2019 FINAL  Final   Organism ID, Bacteria PROTEUS MIRABILIS  Final      Susceptibility   Proteus mirabilis - MIC*    AMPICILLIN <=2 SENSITIVE Sensitive     CEFAZOLIN <=4 SENSITIVE Sensitive     CEFEPIME <=1 SENSITIVE Sensitive     CEFTAZIDIME <=1 SENSITIVE Sensitive     CEFTRIAXONE <=1 SENSITIVE Sensitive     CIPROFLOXACIN <=0.25 SENSITIVE Sensitive     GENTAMICIN <=1 SENSITIVE Sensitive  IMIPENEM 8 INTERMEDIATE Intermediate     TRIMETH/SULFA <=20 SENSITIVE Sensitive     AMPICILLIN/SULBACTAM <=2 SENSITIVE Sensitive     PIP/TAZO <=4 SENSITIVE Sensitive     * RARE PROTEUS MIRABILIS  SARS CORONAVIRUS 2 (TAT 6-24 HRS) Nasopharyngeal Nasopharyngeal Swab     Status: None   Collection Time: 06/26/19  8:42 PM   Specimen: Nasopharyngeal Swab  Result Value Ref Range Status   SARS Coronavirus 2 NEGATIVE NEGATIVE Final    Comment: (NOTE) SARS-CoV-2 target nucleic acids are NOT DETECTED. The SARS-CoV-2 RNA is generally detectable in upper and lower respiratory specimens during the acute phase of infection. Negative results do not preclude SARS-CoV-2 infection, do not rule out co-infections with other  pathogens, and should not be used as the sole basis for treatment or other patient management decisions. Negative results must be combined with clinical observations, patient history, and epidemiological information. The expected result is Negative. Fact Sheet for Patients: SugarRoll.be Fact Sheet for Healthcare Providers: https://www.woods-mathews.com/ This test is not yet approved or cleared by the [place] FDA and  has been authorized for detection and/or diagnosis of SARS-CoV-2 by FDA under an Emergency Use Authorization (EUA). This EUA will remain  in effect (meaning this test can be used) for the duration of the COVID-19 declaration under Section 56 4(b)(1) of the Act, 21 U.S.C. section 360bbb-3(b)(1), unless the authorization is terminated or revoked sooner. Performed at [facility], [place] [address], [place], [place] [address]      Radiology Studies: No results found.  Scheduled Meds:  amLODipine  10 mg Oral Daily   atorvastatin  40 mg Oral q1800   Chlorhexidine Gluconate Cloth  6 each Topical Q0600   Chlorhexidine Gluconate Cloth  6 each Topical Q0600   darbepoetin (ARANESP) injection - DIALYSIS  150 mcg Intravenous Q Sat-HD   docusate sodium  100 mg Oral BID   gabapentin  200 mg Oral QHS   hydrALAZINE  100 mg Oral Q8H   labetalol  200 mg Oral BID   levothyroxine  100 mcg Oral QAC breakfast   sevelamer carbonate  800 mg Oral TID WC   Continuous Infusions:  sodium chloride 10 mL/hr at 06/18/19 1746   sodium chloride 10 mL/hr at 06/23/19 1205    ceFAZolin (ANCEF) IV 2 g (06/27/19 1346)   [START ON 07/02/2019]  ceFAZolin (ANCEF) IV       LOS: 29 days   Marylu Lund, MD [facility] Pager On Amion  If 7PM-7AM, please contact night-coverage 06/28/2019, 3:25 PM

## 2019-06-29 LAB — RENAL FUNCTION PANEL
Albumin: 2.5 g/dL — ABNORMAL LOW (ref 3.5–5.0)
Anion gap: 16 — ABNORMAL HIGH (ref 5–15)
BUN: 36 mg/dL — ABNORMAL HIGH (ref 8–23)
CO2: 21 mmol/L — ABNORMAL LOW (ref 22–32)
Calcium: 8.5 mg/dL — ABNORMAL LOW (ref 8.9–10.3)
Chloride: 97 mmol/L — ABNORMAL LOW (ref 98–111)
Creatinine, Ser: 6.16 mg/dL — ABNORMAL HIGH (ref 0.44–1.00)
GFR calc Af Amer: 7 mL/min — ABNORMAL LOW (ref >60)
GFR calc non Af Amer: 6 mL/min — ABNORMAL LOW (ref >60)
Glucose, Bld: 105 mg/dL — ABNORMAL HIGH (ref 70–99)
Phosphorus: 4.8 mg/dL — ABNORMAL HIGH (ref 2.5–4.6)
Potassium: 4.4 mmol/L (ref 3.5–5.1)
Sodium: 134 mmol/L — ABNORMAL LOW (ref 135–145)

## 2019-06-29 LAB — CBC
HCT: 31.8 % — ABNORMAL LOW (ref 36.0–46.0)
Hemoglobin: 9.4 g/dL — ABNORMAL LOW (ref 12.0–15.0)
MCH: 26.6 pg (ref 26.0–34.0)
MCHC: 29.6 g/dL — ABNORMAL LOW (ref 30.0–36.0)
MCV: 89.8 fL (ref 80.0–100.0)
Platelets: 125 10*3/uL — ABNORMAL LOW (ref 150–400)
RBC: 3.54 MIL/uL — ABNORMAL LOW (ref 3.87–5.11)
RDW: 18 % — ABNORMAL HIGH (ref 11.5–15.5)
WBC: 13.4 10*3/uL — ABNORMAL HIGH (ref 4.0–10.5)
nRBC: 0.8 % — ABNORMAL HIGH (ref 0.0–0.2)

## 2019-06-29 LAB — GLUCOSE, CAPILLARY
Glucose-Capillary: 111 mg/dL — ABNORMAL HIGH (ref 70–99)
Glucose-Capillary: 108 mg/dL — ABNORMAL HIGH (ref 70–99)
Glucose-Capillary: 97 mg/dL (ref 70–99)
Glucose-Capillary: 95 mg/dL (ref 70–99)

## 2019-06-29 MED ORDER — CHLORHEXIDINE GLUCONATE CLOTH 2 % EX PADS
6.0000 | MEDICATED_PAD | Freq: Every day | CUTANEOUS | Status: DC
  Administered 2019-06-30: 6 via TOPICAL

## 2019-06-29 NOTE — Progress Notes (Signed)
[facility] Progress Note    Assessment/ Plan:   1. ESRD, new this admission.  On THS schedule currently--> has TTS spot [place],  Maturing AVF.  Had HD on 11/23 with holiday schedule  - HD today, 11/25 per holiday schedule then resume TTS schedule after that  2. Sepsis from infected back wound - NSG and ID following, will need spinal washout-- had it 11/19, wound vac in place, cultures growing proteus, s/p cefepime and now on ancef. --> Per primary team - plan is for ancef 2 gram with each HD through 08/05/2019  3. Anemia secondary to ESRD - on ESA, transfuse prn   4. 2HPTH< hyperphos on renvela    5. HTN: continue current regimen   6. Dispo: pending.  From a renal perspective acceptable for discharge    Subjective:    Still awaiting placement.  Hasn't yet had HD today.  Feels ok   Review of systems: denies shortness of breath or chest pain; no n/v     Objective:   BP (!) 147/54 (BP Location: Right Arm)   Pulse 74   Temp 98.2 F (36.8 C) (Oral)   Resp 18   Ht 5\' 3"  (1.6 m)   Wt 92 kg   SpO2 100%   BMI 35.93 kg/m   Intake/Output Summary (Last 24 hours) at 06/29/2019 1547 Last data filed at 06/29/2019 1230 Gross per 24 hour  Intake 240 ml  Output 30 ml  Net 210 ml   Weight change:   Physical Exam:  Gen: NAD, lying flat in bed in NAD  CVS:RRR no m/r/g Resp: clear bilaterally no c/w/r Abd: soft NT Ext: no LE edema NEURO: awake and pleasant; follows commands ACCESS: R IJ TDC clean/dry/ intact; LUE AVF + T/B  Imaging: No results found.  Labs: BMET Recent Labs  Lab 06/23/19 0442 06/23/19 1201 06/24/19 0619 06/25/19 0947 06/27/19 1015 06/28/19 0315 06/29/19 1250  NA 133* 135 134* 133* 131* 135 134*  K 3.9 3.5 4.0 3.9 3.6 3.3* 4.4  CL 98 97* 98 97* 93* 97* 97*  CO2 22  --  20* 21* 24 23 21*  GLUCOSE 99 128* 129* 162* 112* 101* 105*  BUN 39* 14 27* 41* 40* 24* 36*  CREATININE 5.80* 2.60* 4.30* 5.90* 5.96* 3.92* 6.16*  CALCIUM  8.5*  --  8.5* 8.7* 8.4* 8.2* 8.5*  PHOS  --   --   --  4.4 6.1*  --  4.8*   CBC Recent Labs  Lab 06/24/19 0619 06/24/19 1453 06/25/19 0627 06/27/19 1015 06/29/19 1250  WBC 19.0*  --  18.5* 16.7* 13.4*  HGB 6.8* 8.7* 8.7* 9.0* 9.4*  HCT 23.5* 28.2* 28.8* 30.3* 31.8*  MCV 89.0  --  87.8 88.6 89.8  PLT 150  --  163 123* 125*    Medications:    . amLODipine  10 mg Oral Daily  . atorvastatin  40 mg Oral q1800  . Chlorhexidine Gluconate Cloth  6 each Topical Q0600  . Chlorhexidine Gluconate Cloth  6 each Topical Q0600  . Chlorhexidine Gluconate Cloth  6 each Topical Q0600  . darbepoetin (ARANESP) injection - DIALYSIS  150 mcg Intravenous Q Sat-HD  . docusate sodium  100 mg Oral BID  . gabapentin  200 mg Oral QHS  . hydrALAZINE  100 mg Oral Q8H  . labetalol  200 mg Oral BID  . levothyroxine  100 mcg Oral QAC breakfast  . sevelamer carbonate  800 mg Oral TID WC  [name]  06/29/2019, 3:47 PM

## 2019-06-29 NOTE — Plan of Care (Signed)
Treatment moved to 11/26 due to volumes.  HD unit Nursing is notifying the patient's floor and I am placing new order for tomorrow.   Taylor Tyler

## 2019-06-29 NOTE — Progress Notes (Signed)
Informed Floor Rn Nelly Rout that HD has been rescheduled for 06/30/2019 per Dr. Royce Macadamia.

## 2019-06-29 NOTE — Progress Notes (Signed)
PROGRESS NOTE    Taylor Tyler  JAS:[id number] DOB: [birth year] DOA: 05/30/2019 PCP: McLean-Scocuzza, Nino Glow, MD    Brief Narrative:  78 yo with diabetes mellitus type 2 presently not on medication, chronic diastolic hypertension, chronic kidney disease stage III, anemia, hypothyroidism, hyperlipidemia.   She presented to the ED on 10/26 for dyspnea after a fall out of bed the night before and being on the floor for about 8 hrs. Her son found her in the AM. She was short of breath for at least 2 days.   In ED, noted to be wheezing. Suspected to have acute diastolic CHF and started on Lasix.  Also noted to have AKI with Cr of 5.74 and K of 5.3.  Past Cr 2.64.  As it was felt that her renal failure was permanent, she was eventually started on dialysis. It was planned to d/c her to SNF however, course complicated by leukocytosis and fevers on 11/11. See below  She was transferred to [facility] on 11/11.  Assessment & Plan:   Principal Problem:   Acute CHF (congestive heart failure) (HCC) Active Problems:   Hypothyroidism   DM (diabetes mellitus), type 2 with renal complications (HCC)   Anemia   Acute renal failure superimposed on stage 3 chronic kidney disease (HCC)   Essential hypertension   Pressure injury of skin   Fever  Principal Problem:  Acute diastolic CHF (congestive heart failure)   Acute on chronic kidney disease stage 4 >> ESRD Metabolic acidosis - despite medical management, she did not diurese well and her Cr continued to rise - 11/4 - tunneled dialysis cath placed and underwent dialysis - Pt is anticipated to be arranged for long-term HD - an outpt dialysis spot has been found- her schedule will be Tu/Th/Sat -Nephrology following. Per Nephrology can be d/c anytime from Nephrology standpoint -Remains stable. Continuing with HD per Nephrology. HD rescheduled for 11/26  Active Problems: Leukocytosis, fever > Sepsis from infected back wound - temp spike 103 on evening  of 11/11 - blood cultures negative  - UA shows rare bacteria and > 50 wbc with yeast - Urine culture shows > 100 K yeast (Not UTI per ID)  - source likely a very small draining wound on lumbar spine related to lumbar fusion surgery in 2018 which has never healed- she has had an infection in the past s/p I and D and wound vac 11/19. Wound appeared re-infected - Neurosurgery consulted on 11/12 - ID consulted 11/12 - Initially continued on Vanc and Cefepime being given with dialysis - Neurosurgery following and pt now s/p lumbar wound debridement with hardware removal -Wound culture pos Proteus. ID recommendations for cefazolin 2gm with HD through 12/31 - Pending SNF placement. Still awaiting insurance authorization  Acute toxic metabolic encephalopathy with underlying dementia - Family has noted memory loss for about 4-5 yrs- she has had delulsions and paranoia for about 2 years - Much improved. Conversing very appropriately    Hypothyroidism  - cont Synthroid as pt tolerates - Stable at this time  DM ruled out - last A1c was 5.2- not on medication at home - sugars remain stable at this time  AOCD - appreciate management per renal  - Currently stable  HTN - on Norvasc, Labetalol, Hydralazine - BP stable at this time  HLD - Continued on Lipitor - Presently stable  Anemia of chronic disease - pt with hgb down to 6.8 - Had ordered 1 unit PRBC with post-transfusion hgb of 8.7 - Hgb  remains stable, reviewed. - Continue to follow CBC trends and transfuse as needed  Decubitus ulcers  Pressure Injury 06/06/19 Sacrum Left Stage I -  Intact skin with non-blanchable redness of a localized area usually over a bony prominence. (Active)  06/06/19 1010  Location: Sacrum  Location Orientation: Left  Staging: Stage I -  Intact skin with non-blanchable redness of a localized area usually over a bony prominence.  Wound Description (Comments):   Present on Admission: No      Pressure Injury 06/12/19 Sacrum Left Stage II -  Partial thickness loss of dermis presenting as a shallow open ulcer with a red, pink wound bed without slough. Pink (Active)  06/12/19 2115  Location: Sacrum  Location Orientation: Left  Staging: Stage II -  Partial thickness loss of dermis presenting as a shallow open ulcer with a red, pink wound bed without slough.  Wound Description (Comments): Pink  Present on Admission:      Pressure Injury 06/15/19 Buttocks Left Stage II -  Partial thickness loss of dermis presenting as a shallow open ulcer with a red, pink wound bed without slough. 100 % pinkk (Active)  06/15/19 1102  Location: Buttocks  Location Orientation: Left  Staging: Stage II -  Partial thickness loss of dermis presenting as a shallow open ulcer with a red, pink wound bed without slough.  Wound Description (Comments): 100 % pinkk  Present on Admission: No   DVT prophylaxis: SCD's Code Status: Full Family Communication: Pt in room, family not at bedside Disposition Plan: SNF pending insurance authorization  Consultants:   Nephrology  Vascular surgery  NS  ID  Procedures:   11/4 tunneled cath  11/19 Lumbar wound debridement and hardware removal  Antimicrobials: Anti-infectives (From admission, onward)   Start     Dose/Rate Route Frequency Ordered Stop   07/02/19 1200  ceFAZolin (ANCEF) IVPB 2g/100 mL premix  Status:  Discontinued     2 g 200 mL/hr over 30 Minutes Intravenous Every T-Th-Sa (Hemodialysis) 06/27/19 0953 06/27/19 1001   07/02/19 1200  ceFAZolin (ANCEF) IVPB 2g/100 mL premix     2 g 200 mL/hr over 30 Minutes Intravenous Once per day on Tue Thu Sat 06/27/19 1001     06/27/19 1200  ceFAZolin (ANCEF) IVPB 2g/100 mL premix  Status:  Discontinued     2 g 200 mL/hr over 30 Minutes Intravenous Every M-W-F (Hemodialysis) 06/27/19 0953 06/27/19 1001   06/27/19 1200  ceFAZolin (ANCEF) IVPB 2g/100 mL premix     2 g 200 mL/hr over 30 Minutes Intravenous  Once per day on Mon Wed Fri 06/27/19 1001 06/29/19 1313   06/25/19 1800  cefTRIAXone (ROCEPHIN) 2 g in sodium chloride 0.9 % 100 mL IVPB  Status:  Discontinued     2 g 200 mL/hr over 30 Minutes Intravenous Every 24 hours 06/25/19 1446 06/27/19 0953   06/23/19 1315  ceFEPIme (MAXIPIME) 2 g in sodium chloride 0.9 % 100 mL IVPB  Status:  Discontinued     2 g 200 mL/hr over 30 Minutes Intravenous To Surgery 06/23/19 1301 06/23/19 1624   06/23/19 1200  bacitracin 50,000 Units in sodium chloride 0.9 % 500 mL irrigation  Status:  Discontinued       As needed 06/23/19 1203 06/23/19 1359   06/16/19 1800  ceFEPIme (MAXIPIME) 2 g in sodium chloride 0.9 % 100 mL IVPB  Status:  Discontinued     2 g 200 mL/hr over 30 Minutes Intravenous Once per day on Tue Thu Sat  06/15/19 1904 06/25/19 1446   06/16/19 1200  vancomycin (VANCOCIN) IVPB 1000 mg/200 mL premix  Status:  Discontinued     1,000 mg 200 mL/hr over 60 Minutes Intravenous Every T-Th-Sa (Hemodialysis) 06/15/19 1904 06/24/19 1209   06/15/19 2030  vancomycin (VANCOCIN) 2,000 mg in sodium chloride 0.9 % 500 mL IVPB     2,000 mg 250 mL/hr over 120 Minutes Intravenous  Once 06/15/19 1902 06/16/19 0010   06/15/19 2000  ceFEPIme (MAXIPIME) 2 g in sodium chloride 0.9 % 100 mL IVPB     2 g 200 mL/hr over 30 Minutes Intravenous  Once 06/15/19 1902 06/15/19 2028   06/08/19 0600  cefUROXime (ZINACEF) 1.5 g in sodium chloride 0.9 % 100 mL IVPB     1.5 g 200 mL/hr over 30 Minutes Intravenous On call to O.R. 06/07/19 1519 06/08/19 1328      Subjective: Eager to go to rehab  Objective: Vitals:   06/29/19 1126 06/29/19 1507 06/29/19 1516 06/29/19 1540  BP: (!) 153/57  (!) 155/52 (!) 147/54  Pulse: 72 83  74  Resp: 17   18  Temp: 97.6 F (36.4 C) 98.5 F (36.9 C)  98.2 F (36.8 C)  TempSrc: Oral Oral  Oral  SpO2: 99%   100%  Weight:      Height:        Intake/Output Summary (Last 24 hours) at 06/29/2019 1855 Last data filed at 06/29/2019 1744  Gross per 24 hour  Intake 390 ml  Output -  Net 390 ml   Filed Weights   06/25/19 1230 06/27/19 0845 06/27/19 1157  Weight: 91 kg 93 kg 92 kg    Examination: General exam: Conversant, in no acute distress Respiratory system: normal chest rise, clear, no audible wheezing  Data Reviewed: I have personally reviewed following labs and imaging studies  CBC: Recent Labs  Lab 06/23/19 0442  06/24/19 0619 06/24/19 1453 06/25/19 0627 06/27/19 1015 06/29/19 1250  WBC 14.7*  --  19.0*  --  18.5* 16.7* 13.4*  HGB 8.0*   < > 6.8* 8.7* 8.7* 9.0* 9.4*  HCT 27.0*   < > 23.5* 28.2* 28.8* 30.3* 31.8*  MCV 87.7  --  89.0  --  87.8 88.6 89.8  PLT 180  --  150  --  163 123* 125*   < > = values in this interval not displayed.   Basic Metabolic Panel: Recent Labs  Lab 06/24/19 0619 06/25/19 0947 06/27/19 1015 06/28/19 0315 06/29/19 1250  NA 134* 133* 131* 135 134*  K 4.0 3.9 3.6 3.3* 4.4  CL 98 97* 93* 97* 97*  CO2 20* 21* 24 23 21*  GLUCOSE 129* 162* 112* 101* 105*  BUN 27* 41* 40* 24* 36*  CREATININE 4.30* 5.90* 5.96* 3.92* 6.16*  CALCIUM 8.5* 8.7* 8.4* 8.2* 8.5*  PHOS  --  4.4 6.1*  --  4.8*   GFR: Estimated Creatinine Clearance: 8.4 mL/min (A) (by C-G formula based on SCr of 6.16 mg/dL (H)). Liver Function Tests: Recent Labs  Lab 06/24/19 0932 06/25/19 0947 06/27/19 1015 06/29/19 1250  AST 18  --   --   --   ALT 15  --   --   --   ALKPHOS 65  --   --   --   BILITOT 0.6  --   --   --   PROT 6.9  --   --   --   ALBUMIN 2.3* 2.3* 2.2* 2.5*   No results for input(s):  LIPASE, AMYLASE in the last 168 hours. No results for input(s): AMMONIA in the last 168 hours. Coagulation Profile: No results for input(s): INR, PROTIME in the last 168 hours. Cardiac Enzymes: No results for input(s): CKTOTAL, CKMB, CKMBINDEX, TROPONINI in the last 168 hours. BNP (last 3 results) No results for input(s): PROBNP in the last 8760 hours. HbA1C: No results for input(s): HGBA1C in the  last 72 hours. CBG: Recent Labs  Lab 06/28/19 1601 06/28/19 2243 06/29/19 0731 06/29/19 1125 06/29/19 1541  GLUCAP 113* 109* 111* 108* 97   Lipid Profile: No results for input(s): CHOL, HDL, LDLCALC, TRIG, CHOLHDL, LDLDIRECT in the last 72 hours. Thyroid Function Tests: No results for input(s): TSH, T4TOTAL, FREET4, T3FREE, THYROIDAB in the last 72 hours. Anemia Panel: No results for input(s): VITAMINB12, FOLATE, FERRITIN, TIBC, IRON, RETICCTPCT in the last 72 hours. Sepsis Labs: No results for input(s): PROCALCITON, LATICACIDVEN in the last 168 hours.  Recent Results (from the past 240 hour(s))  SARS CORONAVIRUS 2 (TAT 6-24 HRS)     Status: None   Collection Time: 06/23/19  5:40 AM  Result Value Ref Range Status   SARS Coronavirus 2 NEGATIVE NEGATIVE Final    Comment: (NOTE) SARS-CoV-2 target nucleic acids are NOT DETECTED. The SARS-CoV-2 RNA is generally detectable in upper and lower respiratory specimens during the acute phase of infection. Negative results do not preclude SARS-CoV-2 infection, do not rule out co-infections with other pathogens, and should not be used as the sole basis for treatment or other patient management decisions. Negative results must be combined with clinical observations, patient history, and epidemiological information. The expected result is Negative. Fact Sheet for Patients: SugarRoll.be Fact Sheet for Healthcare Providers: https://www.woods-mathews.com/ This test is not yet approved or cleared by the [place] FDA and  has been authorized for detection and/or diagnosis of SARS-CoV-2 by FDA under an Emergency Use Authorization (EUA). This EUA will remain  in effect (meaning this test can be used) for the duration of the COVID-19 declaration under Section 56 4(b)(1) of the Act, 21 U.S.C. section 360bbb-3(b)(1), unless the authorization is terminated or revoked sooner. Performed at [facility], [place] [address], [place], [place] [address]   Aerobic/Anaerobic Culture (surgical/deep wound)     Status: None   Collection Time: 06/23/19  1:02 PM   Specimen: Soft Tissue, Other  Result Value Ref Range Status   Specimen Description TISSUE LUMBAR  Final   Special Requests PATIENT ON FOLLOWING VANC  Final   Gram Stain   Final    MODERATE WBC PRESENT,BOTH PMN AND MONONUCLEAR NO ORGANISMS SEEN    Culture   Final    RARE PROTEUS MIRABILIS CRITICAL RESULT CALLED TO, READ BACK BY AND VERIFIED WITH: H. STONE, RN AT 1055 ON 06/24/19 BY C. JESSUP, MT. NO ANAEROBES ISOLATED Performed at [facility], [place] [address] [place], [place] [address]    Report Status 06/28/2019 FINAL  Final   Organism ID, Bacteria PROTEUS MIRABILIS  Final      Susceptibility   Proteus mirabilis - MIC*    AMPICILLIN <=2 SENSITIVE Sensitive     CEFAZOLIN <=4 SENSITIVE Sensitive     CEFEPIME <=1 SENSITIVE Sensitive     CEFTAZIDIME <=1 SENSITIVE Sensitive     CEFTRIAXONE <=1 SENSITIVE Sensitive     CIPROFLOXACIN <=0.25 SENSITIVE Sensitive     GENTAMICIN <=1 SENSITIVE Sensitive     IMIPENEM 8 INTERMEDIATE Intermediate     TRIMETH/SULFA <=20 SENSITIVE Sensitive  AMPICILLIN/SULBACTAM <=2 SENSITIVE Sensitive     PIP/TAZO <=4 SENSITIVE Sensitive     * RARE PROTEUS MIRABILIS  SARS CORONAVIRUS 2 (TAT 6-24 HRS) Nasopharyngeal Nasopharyngeal Swab     Status: None   Collection Time: 06/26/19  8:42 PM   Specimen: Nasopharyngeal Swab  Result Value Ref Range Status   SARS Coronavirus 2 NEGATIVE NEGATIVE Final    Comment: (NOTE) SARS-CoV-2 target nucleic acids are NOT DETECTED. The SARS-CoV-2 RNA is generally detectable in upper and lower respiratory specimens during the acute phase of infection. Negative results do not preclude SARS-CoV-2 infection, do not rule out co-infections with other pathogens, and should not be used as the sole basis for treatment or other patient management decisions. Negative  results must be combined with clinical observations, patient history, and epidemiological information. The expected result is Negative. Fact Sheet for Patients: SugarRoll.be Fact Sheet for Healthcare Providers: https://www.woods-mathews.com/ This test is not yet approved or cleared by the [place] FDA and  has been authorized for detection and/or diagnosis of SARS-CoV-2 by FDA under an Emergency Use Authorization (EUA). This EUA will remain  in effect (meaning this test can be used) for the duration of the COVID-19 declaration under Section 56 4(b)(1) of the Act, 21 U.S.C. section 360bbb-3(b)(1), unless the authorization is terminated or revoked sooner. Performed at [facility], [place] [address], [place], [place] [address]      Radiology Studies: No results found.  Scheduled Meds: . amLODipine  10 mg Oral Daily  . atorvastatin  40 mg Oral q1800  . Chlorhexidine Gluconate Cloth  6 each Topical Q0600  . Chlorhexidine Gluconate Cloth  6 each Topical Q0600  . Chlorhexidine Gluconate Cloth  6 each Topical Q0600  . [START ON 06/30/2019] Chlorhexidine Gluconate Cloth  6 each Topical Q0600  . darbepoetin (ARANESP) injection - DIALYSIS  150 mcg Intravenous Q Sat-HD  . docusate sodium  100 mg Oral BID  . gabapentin  200 mg Oral QHS  . hydrALAZINE  100 mg Oral Q8H  . labetalol  200 mg Oral BID  . levothyroxine  100 mcg Oral QAC breakfast  . sevelamer carbonate  800 mg Oral TID WC   Continuous Infusions: . sodium chloride 10 mL/hr at 06/18/19 1746  . sodium chloride 10 mL/hr at 06/29/19 1231  . [START ON 07/02/2019]  ceFAZolin (ANCEF) IV       LOS: 30 days   Marylu Lund, MD [facility] Pager On Amion  If 7PM-7AM, please contact night-coverage 06/29/2019, 6:55 PM

## 2019-06-29 NOTE — TOC Progression Note (Signed)
Transition of Care [facility]) - Progression Note    Patient Details  Name: Taylor Tyler MRN: [id number] Date of Birth: 07-03-1941  Transition of Care [facility]) CM/SW [place], [place] Phone Number: 06/29/2019, 10:33 AM  Clinical Narrative:     Patient's insurance remains pending.   Thurmond Butts, MSW, [facility] Clinical Social Worker [phone]   Expected Discharge Plan: Skilled Nursing Facility Barriers to Discharge: Insurance Authorization  Expected Discharge Plan and Services Expected Discharge Plan: [place] In-house Referral: NA Discharge Planning Services: CM Consult Post Acute Care Choice: [place] Living arrangements for the past 2 months: Single Family Home                 DME Arranged: (NA)         HH Arranged: RN, PT [place] Agency: [place] Date [facility] Agency Contacted: 06/10/19 Time [place]: [phone] Representative spoke with at [place]: cory   Social Determinants of Health ([place]) Interventions    Readmission Risk Interventions No flowsheet data found.

## 2019-06-29 NOTE — TOC Progression Note (Signed)
Transition of Care [facility]) - Progression Note    Patient Details  Name: Taylor Tyler MRN: [id number] Date of Birth: June 17, 1941  Transition of Care [facility]) CM/SW [place], [place] Phone Number: 06/29/2019, 2:37 PM  Clinical Narrative:     CSW contacted the patient's son [name]- CSW advised patient will be discharged today if insurance approval is received. CSW explained she will transported by [facility]. CSW advised paperwork needs to be completed today at HD outpatient clinic today or Friday but it must be done.   CSW called [facility]- left voice message to return call.  Thurmond Butts, MSW, [facility] Clinical Social Worker [phone]   Expected Discharge Plan: Skilled Nursing Facility Barriers to Discharge: Insurance Authorization  Expected Discharge Plan and Services Expected Discharge Plan: [place] In-house Referral: NA Discharge Planning Services: CM Consult Post Acute Care Choice: [place] Living arrangements for the past 2 months: Single Family Home                 DME Arranged: (NA)         HH Arranged: RN, PT [place] Agency: [place] Date [facility] Agency Contacted: 06/10/19 Time [place]: [phone] Representative spoke with at [place]: cory   Social Determinants of Health ([place]) Interventions    Readmission Risk Interventions No flowsheet data found.

## 2019-06-29 NOTE — Progress Notes (Signed)
Physical Therapy Treatment Patient Details Name: Taylor Tyler MRN: [id number] DOB: 1941/06/27 Today's Date: 06/29/2019    History of Present Illness Pt is a 78 y.o. female admitted 05/30/19 after fall at home, laying on the floor at least 8 hours until found by son, also with worsening SOB and LE edema. VQ scan with low probability for PE. Worked up for HF, AKI. Course complicated by leukocytosis and fevers on 11/11. Pt with chronic lumbar wound infection/dehiscense with fusion from L3-S1; now s/p hardward removal on 11/19 with wound VAC placement. Other PMH includes CKD III, DM2, DJD s/p lumbar sx (2018), arthritis.    PT Comments    Pt making fair progress with functional mobility, requiring less physical assistance and able to participate in transfers as well as pre-gait activities this session. Pt would continue to benefit from skilled physical therapy services at this time while admitted and after d/c to address the below listed limitations in order to improve overall safety and independence with functional mobility.    Follow Up Recommendations  SNF     Equipment Recommendations  None recommended by PT    Recommendations for Other Services       Precautions / Restrictions Precautions Precautions: Fall Precaution Comments: wound VAC to back Restrictions Weight Bearing Restrictions: No    Mobility  Bed Mobility Overal bed mobility: Needs Assistance Bed Mobility: Rolling;Sidelying to Sit;Sit to Sidelying Rolling: Supervision Sidelying to sit: Min assist     Sit to sidelying: Min assist General bed mobility comments: increased time and effort, use of bed rails, cueing for log roll technique for comfort, assistance needed for trunk elevation to achieve upright sitting at EOB, min A to return bilateral LEs onto bed  Transfers Overall transfer level: Needs assistance Equipment used: Rolling walker (2 wheeled) Transfers: Sit to/from Stand Sit to Stand: From elevated  surface;Mod assist         General transfer comment: multiple attempts required, use of momentum, good technique, assistance needed to power into standing from sitting EOB and for stability with transitional movement  Ambulation/Gait Ambulation/Gait assistance: Min assist   Assistive device: Rolling walker (2 wheeled)       General Gait Details: pt able to take very small side steps (3-4) at EOB towards HOB with min A and use of RW; pt also with bilateral LEs bracing against EOB for support   Stairs             Wheelchair Mobility    Modified Rankin (Stroke Patients Only)       Balance Overall balance assessment: Needs assistance Sitting-balance support: Feet supported;No upper extremity supported Sitting balance-Leahy Scale: Fair     Standing balance support: During functional activity;Bilateral upper extremity supported Standing balance-Leahy Scale: Poor                              Cognition Arousal/Alertness: Awake/alert Behavior During Therapy: Flat affect Overall Cognitive Status: Impaired/Different from baseline Area of Impairment: Memory;Safety/judgement;Problem solving                     Memory: Decreased short-term memory;Decreased recall of precautions   Safety/Judgement: Decreased awareness of safety;Decreased awareness of deficits   Problem Solving: Slow processing;Decreased initiation;Difficulty sequencing;Requires verbal cues        Exercises General Exercises - Lower Extremity Ankle Circles/Pumps: Both;10 reps;Seated Long Arc Quad: AROM;Strengthening;Both;10 reps;Seated Hip Flexion/Marching: Seated;AROM;Strengthening;Both;10 reps    General Comments  Pertinent Vitals/Pain Pain Assessment: Faces Faces Pain Scale: Hurts little more Pain Location: lower back Pain Descriptors / Indicators: Sore Pain Intervention(s): Monitored during session;Repositioned    Home Living                      Prior  Function            PT Goals (current goals can now be found in the care plan section) Acute Rehab PT Goals PT Goal Formulation: With patient Time For Goal Achievement: 07/08/19 Potential to Achieve Goals: Good Progress towards PT goals: Progressing toward goals    Frequency    Min 2X/week      PT Plan Current plan remains appropriate    Co-evaluation              AM-PAC PT "6 Clicks" Mobility   Outcome Measure  Help needed turning from your back to your side while in a flat bed without using bedrails?: A Little Help needed moving from lying on your back to sitting on the side of a flat bed without using bedrails?: A Little Help needed moving to and from a bed to a chair (including a wheelchair)?: A Lot Help needed standing up from a chair using your arms (e.g., wheelchair or bedside chair)?: A Lot Help needed to walk in hospital room?: A Lot Help needed climbing 3-5 steps with a railing? : Total 6 Click Score: 13    End of Session Equipment Utilized During Treatment: Gait belt Activity Tolerance: Patient limited by fatigue Patient left: in bed;with call bell/phone within reach Nurse Communication: Mobility status PT Visit Diagnosis: Muscle weakness (generalized) (M62.81)     Time: 5277-8242 PT Time Calculation (min) (ACUTE ONLY): 16 min  Charges:  $Therapeutic Activity: 8-22 mins                     Anastasio Champion, DPT  Acute Rehabilitation Services Pager [phone] Office [place] 06/29/2019, 10:35 AM

## 2019-06-29 NOTE — Progress Notes (Signed)
Renal Navigator updated OP HD clinic/[facility] regarding continued wait for SNF re-auth and then discharge. Per Clinic Manager, patient's son has still not come to sign consents for patient's treatment in the OP HD clinic. Renal Navigator has discussed this need with patient's son Nitika Jackowski 3 times over the course of patient's hospitalization and does not know how else to tell him that patient will not be able to start in the OP HD clinic until these papers are completed by him. It has already been determined and documented that patient is confused at times and cannot sign her own consents for treatment. He will absolutely need to report to the clinic this Friday, 07/01/19 if patient is going to discharge and start in the clinic on Saturday, 07/02/19. Renal Navigator spoke with CSW/C. Johnson to request that she speak with patient's son about this so that he is aware that patient is nearing discharge and knows that this needs to be completed in order for discharge to take place. CSW agreed. Renal Navigator will leave report for Renal PA to send orders to OP HD clinic should patient discharge in the next few days and start in the clinic on Saturday.  Alphonzo Cruise, [facility] Renal Navigator [phone]

## 2019-06-30 LAB — RENAL FUNCTION PANEL
Albumin: 2.3 g/dL — ABNORMAL LOW (ref 3.5–5.0)
Anion gap: 16 — ABNORMAL HIGH (ref 5–15)
BUN: 38 mg/dL — ABNORMAL HIGH (ref 8–23)
CO2: 22 mmol/L (ref 22–32)
Calcium: 8.5 mg/dL — ABNORMAL LOW (ref 8.9–10.3)
Chloride: 97 mmol/L — ABNORMAL LOW (ref 98–111)
Creatinine, Ser: 6.92 mg/dL — ABNORMAL HIGH (ref 0.44–1.00)
GFR calc Af Amer: 6 mL/min — ABNORMAL LOW (ref >60)
GFR calc non Af Amer: 5 mL/min — ABNORMAL LOW (ref >60)
Glucose, Bld: 89 mg/dL (ref 70–99)
Phosphorus: 5 mg/dL — ABNORMAL HIGH (ref 2.5–4.6)
Potassium: 4.3 mmol/L (ref 3.5–5.1)
Sodium: 135 mmol/L (ref 135–145)

## 2019-06-30 LAB — GLUCOSE, CAPILLARY
Glucose-Capillary: 89 mg/dL (ref 70–99)
Glucose-Capillary: 128 mg/dL — ABNORMAL HIGH (ref 70–99)
Glucose-Capillary: 125 mg/dL — ABNORMAL HIGH (ref 70–99)
Glucose-Capillary: 98 mg/dL (ref 70–99)
Glucose-Capillary: 77 mg/dL (ref 70–99)

## 2019-06-30 NOTE — Progress Notes (Signed)
PROGRESS NOTE    Taylor Tyler  XBJ:[id number] DOB: 05/15/1941 DOA: 05/30/2019 PCP: McLean-Scocuzza, Nino Glow, MD    Brief Narrative:  78 yo with diabetes mellitus type 2 presently not on medication, chronic diastolic hypertension, chronic kidney disease stage III, anemia, hypothyroidism, hyperlipidemia.   She presented to the ED on 10/26 for dyspnea after a fall out of bed the night before and being on the floor for about 8 hrs. Her son found her in the AM. She was short of breath for at least 2 days.   In ED, noted to be wheezing. Suspected to have acute diastolic CHF and started on Lasix.  Also noted to have AKI with Cr of 5.74 and K of 5.3.  Past Cr 2.64.  As it was felt that her renal failure was permanent, she was eventually started on dialysis. It was planned to d/c her to SNF however, course complicated by leukocytosis and fevers on 11/11. See below  She was transferred to [facility] on 11/11.  Assessment & Plan:   Principal Problem:   Acute CHF (congestive heart failure) (HCC) Active Problems:   Hypothyroidism   DM (diabetes mellitus), type 2 with renal complications (HCC)   Anemia   Acute renal failure superimposed on stage 3 chronic kidney disease (HCC)   Essential hypertension   Pressure injury of skin   Fever  Principal Problem:  Acute diastolic CHF (congestive heart failure)   Acute on chronic kidney disease stage 4 >> ESRD Metabolic acidosis - despite medical management, she did not diurese well and her Cr continued to rise - 11/4 - tunneled dialysis cath placed and underwent dialysis - Pt is anticipated to be arranged for long-term HD - an outpt dialysis spot has been found- her schedule will be Tu/Th/Sat -Nephrology following. Per Nephrology can be d/c anytime from Nephrology standpoint -Remains stable. Continuing with HD per Nephrology.  Tolerated hemodialysis today  Active Problems: Leukocytosis, fever > Sepsis from infected back wound - temp spike 103 on  evening of 11/11 - blood cultures negative  - UA shows rare bacteria and > 50 wbc with yeast - Urine culture shows > 100 K yeast (Not UTI per ID)  - source likely a very small draining wound on lumbar spine related to lumbar fusion surgery in 2018 which has never healed- she has had an infection in the past s/p I and D and wound vac 11/19. Wound appeared re-infected - Neurosurgery consulted on 11/12 - ID consulted 11/12 - Initially continued on Vanc and Cefepime being given with dialysis - Neurosurgery following and pt now s/p lumbar wound debridement with hardware removal -Wound culture pos Proteus. ID recommendations for cefazolin 2gm with HD through 12/31 -Continue to await SNF placement, awaiting insurance authorization.  We will repeat Covid test as most recent test was 4 days ago  Acute toxic metabolic encephalopathy with underlying dementia - Family has noted memory loss for about 4-5 yrs- she has had delulsions and paranoia for about 2 years - Much improved. Conversing very appropriately    Hypothyroidism  - cont Synthroid as pt tolerates - Stable at this time  DM ruled out - last A1c was 5.2- not on medication at home - sugars remain stable at this time  AOCD - appreciate management per renal  - Currently stable  HTN - on Norvasc, Labetalol, Hydralazine - BP stable at this time  HLD - Continued on Lipitor - Presently stable  Anemia of chronic disease - pt with hgb down to  6.8 - Had ordered 1 unit PRBC with post-transfusion hgb of 8.7 - Hgb remains stable, reviewed. - Continue to follow CBC trends and transfuse as needed  Decubitus ulcers  Pressure Injury 06/06/19 Sacrum Left Stage I -  Intact skin with non-blanchable redness of a localized area usually over a bony prominence. (Active)  06/06/19 1010  Location: Sacrum  Location Orientation: Left  Staging: Stage I -  Intact skin with non-blanchable redness of a localized area usually over a bony  prominence.  Wound Description (Comments):   Present on Admission: No     Pressure Injury 06/12/19 Sacrum Left Stage II -  Partial thickness loss of dermis presenting as a shallow open ulcer with a red, pink wound bed without slough. Pink (Active)  06/12/19 2115  Location: Sacrum  Location Orientation: Left  Staging: Stage II -  Partial thickness loss of dermis presenting as a shallow open ulcer with a red, pink wound bed without slough.  Wound Description (Comments): Pink  Present on Admission:      Pressure Injury 06/15/19 Buttocks Left Stage II -  Partial thickness loss of dermis presenting as a shallow open ulcer with a red, pink wound bed without slough. 100 % pinkk (Active)  06/15/19 1102  Location: Buttocks  Location Orientation: Left  Staging: Stage II -  Partial thickness loss of dermis presenting as a shallow open ulcer with a red, pink wound bed without slough.  Wound Description (Comments): 100 % pinkk  Present on Admission: No   DVT prophylaxis: SCD's Code Status: Full Family Communication: Pt in room, family not at bedside Disposition Plan: SNF pending insurance authorization  Consultants:   Nephrology  Vascular surgery  NS  ID  Procedures:   11/4 tunneled cath  11/19 Lumbar wound debridement and hardware removal  Antimicrobials: Anti-infectives (From admission, onward)   Start     Dose/Rate Route Frequency Ordered Stop   07/02/19 1200  ceFAZolin (ANCEF) IVPB 2g/100 mL premix  Status:  Discontinued     2 g 200 mL/hr over 30 Minutes Intravenous Every T-Th-Sa (Hemodialysis) 06/27/19 0953 06/27/19 1001   07/02/19 1200  ceFAZolin (ANCEF) IVPB 2g/100 mL premix     2 g 200 mL/hr over 30 Minutes Intravenous Once per day on Tue Thu Sat 06/27/19 1001     06/27/19 1200  ceFAZolin (ANCEF) IVPB 2g/100 mL premix  Status:  Discontinued     2 g 200 mL/hr over 30 Minutes Intravenous Every M-W-F (Hemodialysis) 06/27/19 0953 06/27/19 1001   06/27/19 1200  ceFAZolin  (ANCEF) IVPB 2g/100 mL premix     2 g 200 mL/hr over 30 Minutes Intravenous Once per day on Mon Wed Fri 06/27/19 1001 06/29/19 1313   06/25/19 1800  cefTRIAXone (ROCEPHIN) 2 g in sodium chloride 0.9 % 100 mL IVPB  Status:  Discontinued     2 g 200 mL/hr over 30 Minutes Intravenous Every 24 hours 06/25/19 1446 06/27/19 0953   06/23/19 1315  ceFEPIme (MAXIPIME) 2 g in sodium chloride 0.9 % 100 mL IVPB  Status:  Discontinued     2 g 200 mL/hr over 30 Minutes Intravenous To Surgery 06/23/19 1301 06/23/19 1624   06/23/19 1200  bacitracin 50,000 Units in sodium chloride 0.9 % 500 mL irrigation  Status:  Discontinued       As needed 06/23/19 1203 06/23/19 1359   06/16/19 1800  ceFEPIme (MAXIPIME) 2 g in sodium chloride 0.9 % 100 mL IVPB  Status:  Discontinued     2  g 200 mL/hr over 30 Minutes Intravenous Once per day on Tue Thu Sat 06/15/19 1904 06/25/19 1446   06/16/19 1200  vancomycin (VANCOCIN) IVPB 1000 mg/200 mL premix  Status:  Discontinued     1,000 mg 200 mL/hr over 60 Minutes Intravenous Every T-Th-Sa (Hemodialysis) 06/15/19 1904 06/24/19 1209   06/15/19 2030  vancomycin (VANCOCIN) 2,000 mg in sodium chloride 0.9 % 500 mL IVPB     2,000 mg 250 mL/hr over 120 Minutes Intravenous  Once 06/15/19 1902 06/16/19 0010   06/15/19 2000  ceFEPIme (MAXIPIME) 2 g in sodium chloride 0.9 % 100 mL IVPB     2 g 200 mL/hr over 30 Minutes Intravenous  Once 06/15/19 1902 06/15/19 2028   06/08/19 0600  cefUROXime (ZINACEF) 1.5 g in sodium chloride 0.9 % 100 mL IVPB     1.5 g 200 mL/hr over 30 Minutes Intravenous On call to O.R. 06/07/19 1519 06/08/19 1328      Subjective: Seen on dialysis, without complaints  Objective: Vitals:   06/30/19 1000 06/30/19 1030 06/30/19 1100 06/30/19 1200  BP: (!) 112/53 (!) 98/52 (!) 96/50 (!) 134/57  Pulse: 75 73 68 65  Resp:    17  Temp:    (!) 97.5 F (36.4 C)  TempSrc:    Oral  SpO2:    100%  Weight:    91.8 kg  Height:        Intake/Output Summary (Last  24 hours) at 06/30/2019 1717 Last data filed at 06/30/2019 1125 Gross per 24 hour  Intake 270 ml  Output 1173 ml  Net -903 ml   Filed Weights   06/30/19 0500 06/30/19 0745 06/30/19 1200  Weight: 98 kg 93 kg 91.8 kg    Examination: General exam: Awake, laying in bed, in nad Respiratory system: Normal respiratory effort, no wheezing  Data Reviewed: I have personally reviewed following labs and imaging studies  CBC: Recent Labs  Lab 06/24/19 0619 06/24/19 1453 06/25/19 0627 06/27/19 1015 06/29/19 1250  WBC 19.0*  --  18.5* 16.7* 13.4*  HGB 6.8* 8.7* 8.7* 9.0* 9.4*  HCT 23.5* 28.2* 28.8* 30.3* 31.8*  MCV 89.0  --  87.8 88.6 89.8  PLT 150  --  163 123* 161*   Basic Metabolic Panel: Recent Labs  Lab 06/25/19 0947 06/27/19 1015 06/28/19 0315 06/29/19 1250 06/30/19 0323  NA 133* 131* 135 134* 135  K 3.9 3.6 3.3* 4.4 4.3  CL 97* 93* 97* 97* 97*  CO2 21* 24 23 21* 22  GLUCOSE 162* 112* 101* 105* 89  BUN 41* 40* 24* 36* 38*  CREATININE 5.90* 5.96* 3.92* 6.16* 6.92*  CALCIUM 8.7* 8.4* 8.2* 8.5* 8.5*  PHOS 4.4 6.1*  --  4.8* 5.0*   GFR: Estimated Creatinine Clearance: 7.4 mL/min (A) (by C-G formula based on SCr of 6.92 mg/dL (H)). Liver Function Tests: Recent Labs  Lab 06/24/19 0960 06/25/19 0947 06/27/19 1015 06/29/19 1250 06/30/19 0323  AST 18  --   --   --   --   ALT 15  --   --   --   --   ALKPHOS 65  --   --   --   --   BILITOT 0.6  --   --   --   --   PROT 6.9  --   --   --   --   ALBUMIN 2.3* 2.3* 2.2* 2.5* 2.3*   No results for input(s): LIPASE, AMYLASE in the last 168 hours. No results  for input(s): AMMONIA in the last 168 hours. Coagulation Profile: No results for input(s): INR, PROTIME in the last 168 hours. Cardiac Enzymes: No results for input(s): CKTOTAL, CKMB, CKMBINDEX, TROPONINI in the last 168 hours. BNP (last 3 results) No results for input(s): PROBNP in the last 8760 hours. HbA1C: No results for input(s): HGBA1C in the last 72 hours.  CBG: Recent Labs  Lab 06/29/19 2220 06/30/19 0624 06/30/19 0721 06/30/19 1335 06/30/19 1650  GLUCAP 95 89 128* 125* 98   Lipid Profile: No results for input(s): CHOL, HDL, LDLCALC, TRIG, CHOLHDL, LDLDIRECT in the last 72 hours. Thyroid Function Tests: No results for input(s): TSH, T4TOTAL, FREET4, T3FREE, THYROIDAB in the last 72 hours. Anemia Panel: No results for input(s): VITAMINB12, FOLATE, FERRITIN, TIBC, IRON, RETICCTPCT in the last 72 hours. Sepsis Labs: No results for input(s): PROCALCITON, LATICACIDVEN in the last 168 hours.  Recent Results (from the past 240 hour(s))  SARS CORONAVIRUS 2 (TAT 6-24 HRS)     Status: None   Collection Time: 06/23/19  5:40 AM  Result Value Ref Range Status   SARS Coronavirus 2 NEGATIVE NEGATIVE Final    Comment: (NOTE) SARS-CoV-2 target nucleic acids are NOT DETECTED. The SARS-CoV-2 RNA is generally detectable in upper and lower respiratory specimens during the acute phase of infection. Negative results do not preclude SARS-CoV-2 infection, do not rule out co-infections with other pathogens, and should not be used as the sole basis for treatment or other patient management decisions. Negative results must be combined with clinical observations, patient history, and epidemiological information. The expected result is Negative. Fact Sheet for Patients: SugarRoll.be Fact Sheet for Healthcare Providers: https://www.woods-mathews.com/ This test is not yet approved or cleared by the [place] FDA and  has been authorized for detection and/or diagnosis of SARS-CoV-2 by FDA under an Emergency Use Authorization (EUA). This EUA will remain  in effect (meaning this test can be used) for the duration of the COVID-19 declaration under Section 56 4(b)(1) of the Act, 21 U.S.C. section 360bbb-3(b)(1), unless the authorization is terminated or revoked sooner. Performed at [facility], [place]  [address] [place], [place] [address]   Aerobic/Anaerobic Culture (surgical/deep wound)     Status: None   Collection Time: 06/23/19  1:02 PM   Specimen: Soft Tissue, Other  Result Value Ref Range Status   Specimen Description TISSUE LUMBAR  Final   Special Requests PATIENT ON FOLLOWING VANC  Final   Gram Stain   Final    MODERATE WBC PRESENT,BOTH PMN AND MONONUCLEAR NO ORGANISMS SEEN    Culture   Final    RARE PROTEUS MIRABILIS CRITICAL RESULT CALLED TO, READ BACK BY AND VERIFIED WITH: H. STONE, RN AT 1055 ON 06/24/19 BY C. JESSUP, MT. NO ANAEROBES ISOLATED Performed at [facility], [place] [address] [place], [place] [address]    Report Status 06/28/2019 FINAL  Final   Organism ID, Bacteria PROTEUS MIRABILIS  Final      Susceptibility   Proteus mirabilis - MIC*    AMPICILLIN <=2 SENSITIVE Sensitive     CEFAZOLIN <=4 SENSITIVE Sensitive     CEFEPIME <=1 SENSITIVE Sensitive     CEFTAZIDIME <=1 SENSITIVE Sensitive     CEFTRIAXONE <=1 SENSITIVE Sensitive     CIPROFLOXACIN <=0.25 SENSITIVE Sensitive     GENTAMICIN <=1 SENSITIVE Sensitive     IMIPENEM 8 INTERMEDIATE Intermediate     TRIMETH/SULFA <=20 SENSITIVE Sensitive     AMPICILLIN/SULBACTAM <=2 SENSITIVE Sensitive  PIP/TAZO <=4 SENSITIVE Sensitive     * RARE PROTEUS MIRABILIS  SARS CORONAVIRUS 2 (TAT 6-24 HRS) Nasopharyngeal Nasopharyngeal Swab     Status: None   Collection Time: 06/26/19  8:42 PM   Specimen: Nasopharyngeal Swab  Result Value Ref Range Status   SARS Coronavirus 2 NEGATIVE NEGATIVE Final    Comment: (NOTE) SARS-CoV-2 target nucleic acids are NOT DETECTED. The SARS-CoV-2 RNA is generally detectable in upper and lower respiratory specimens during the acute phase of infection. Negative results do not preclude SARS-CoV-2 infection, do not rule out co-infections with other pathogens, and should not be used as the sole basis for treatment or other patient management decisions. Negative results must be combined  with clinical observations, patient history, and epidemiological information. The expected result is Negative. Fact Sheet for Patients: SugarRoll.be Fact Sheet for Healthcare Providers: https://www.woods-mathews.com/ This test is not yet approved or cleared by the [place] FDA and  has been authorized for detection and/or diagnosis of SARS-CoV-2 by FDA under an Emergency Use Authorization (EUA). This EUA will remain  in effect (meaning this test can be used) for the duration of the COVID-19 declaration under Section 56 4(b)(1) of the Act, 21 U.S.C. section 360bbb-3(b)(1), unless the authorization is terminated or revoked sooner. Performed at [facility], [place] [address] [place], [place] [address]      Radiology Studies: No results found.  Scheduled Meds: . amLODipine  10 mg Oral Daily  . atorvastatin  40 mg Oral q1800  . Chlorhexidine Gluconate Cloth  6 each Topical Q0600  . Chlorhexidine Gluconate Cloth  6 each Topical Q0600  . Chlorhexidine Gluconate Cloth  6 each Topical Q0600  . Chlorhexidine Gluconate Cloth  6 each Topical Q0600  . darbepoetin (ARANESP) injection - DIALYSIS  150 mcg Intravenous Q Sat-HD  . docusate sodium  100 mg Oral BID  . gabapentin  200 mg Oral QHS  . hydrALAZINE  100 mg Oral Q8H  . labetalol  200 mg Oral BID  . levothyroxine  100 mcg Oral QAC breakfast  . sevelamer carbonate  800 mg Oral TID WC   Continuous Infusions: . sodium chloride 10 mL/hr at 06/18/19 1746  . sodium chloride 10 mL/hr at 06/29/19 1231  . [START ON 07/02/2019]  ceFAZolin (ANCEF) IV       LOS: 31 days   Marylu Lund, MD [facility] Pager On Amion  If 7PM-7AM, please contact night-coverage 06/30/2019, 5:17 PM

## 2019-06-30 NOTE — Progress Notes (Signed)
[facility] Progress Note    Assessment/ Plan:   1. ESRD, new this admission.  On THS schedule currently--> has TTS spot [place],  Maturing AVF.  Had HD on 11/23 with holiday schedule.  Note weights quite variable. - HD today, 11/26 as her tx 11/25 was postponed until today  - Continue with TTS schedule   2. Sepsis from infected back wound - NSG and ID following, will need spinal washout-- had it 11/19, wound vac in place, cultures growing proteus, s/p cefepime and now on ancef. --> Per primary team - plan is for ancef 2 gram with each HD through 08/05/2019  3. Anemia secondary to ESRD - on ESA, transfuse prn   4. 2HPTH< hyperphos on renvela    5. HTN: continue current regimen. May be able to decrease amlodipine to 5 mg daily   6. Dispo: pending.  From a renal perspective acceptable for discharge    Subjective:    Seen on HD at 10:18 am procedure supervised.  110/56 and HR 75. RIJ tunneled catheter in use.  BP Her treatment was postponed due to patient volumes yesterday.  She hasn't had any dizziness or cramping.   Review of systems: denies shortness of breath or chest pain; no n/v     Objective:   BP (!) 112/53   Pulse 75   Temp 97.8 F (36.6 C) (Oral)   Resp 18   Ht 5\' 3"  (1.6 m)   Wt 98 kg   SpO2 100%   BMI 38.27 kg/m   Intake/Output Summary (Last 24 hours) at 06/30/2019 1023 Last data filed at 06/30/2019 0700 Gross per 24 hour  Intake 510 ml  Output 50 ml  Net 460 ml   Weight change:   Physical Exam:  Gen: NAD, lying flat in bed in NAD  CVS:RRR no m/r/g Resp: clear bilaterally and unlabored Abd: soft NT Ext: she has no LE edema NEURO: awake and pleasant; follows commands ACCESS: R IJ TDC clean/dry/ intact; LUE AVF + T/B  Imaging: No results found.  Labs: BMET Recent Labs  Lab 06/23/19 1201 06/24/19 0619 06/25/19 0947 06/27/19 1015 06/28/19 0315 06/29/19 1250 06/30/19 0323  NA 135 134* 133* 131* 135 134* 135  K 3.5 4.0 3.9  3.6 3.3* 4.4 4.3  CL 97* 98 97* 93* 97* 97* 97*  CO2  --  20* 21* 24 23 21* 22  GLUCOSE 128* 129* 162* 112* 101* 105* 89  BUN 14 27* 41* 40* 24* 36* 38*  CREATININE 2.60* 4.30* 5.90* 5.96* 3.92* 6.16* 6.92*  CALCIUM  --  8.5* 8.7* 8.4* 8.2* 8.5* 8.5*  PHOS  --   --  4.4 6.1*  --  4.8* 5.0*   CBC Recent Labs  Lab 06/24/19 0619 06/24/19 1453 06/25/19 0627 06/27/19 1015 06/29/19 1250  WBC 19.0*  --  18.5* 16.7* 13.4*  HGB 6.8* 8.7* 8.7* 9.0* 9.4*  HCT 23.5* 28.2* 28.8* 30.3* 31.8*  MCV 89.0  --  87.8 88.6 89.8  PLT 150  --  163 123* 125*    Medications:    . amLODipine  10 mg Oral Daily  . atorvastatin  40 mg Oral q1800  . Chlorhexidine Gluconate Cloth  6 each Topical Q0600  . Chlorhexidine Gluconate Cloth  6 each Topical Q0600  . Chlorhexidine Gluconate Cloth  6 each Topical Q0600  . Chlorhexidine Gluconate Cloth  6 each Topical Q0600  . darbepoetin (ARANESP) injection - DIALYSIS  150 mcg Intravenous Q Sat-HD  .  docusate sodium  100 mg Oral BID  . gabapentin  200 mg Oral QHS  . hydrALAZINE  100 mg Oral Q8H  . labetalol  200 mg Oral BID  . levothyroxine  100 mcg Oral QAC breakfast  . sevelamer carbonate  800 mg Oral TID WC    [name]  06/30/2019, 10:23 AM

## 2019-07-01 LAB — GLUCOSE, CAPILLARY
Glucose-Capillary: 120 mg/dL — ABNORMAL HIGH (ref 70–99)
Glucose-Capillary: 98 mg/dL (ref 70–99)
Glucose-Capillary: 71 mg/dL (ref 70–99)
Glucose-Capillary: 78 mg/dL (ref 70–99)

## 2019-07-01 LAB — RENAL FUNCTION PANEL
Albumin: 2.2 g/dL — ABNORMAL LOW (ref 3.5–5.0)
Anion gap: 16 — ABNORMAL HIGH (ref 5–15)
BUN: 25 mg/dL — ABNORMAL HIGH (ref 8–23)
CO2: 24 mmol/L (ref 22–32)
Calcium: 8.5 mg/dL — ABNORMAL LOW (ref 8.9–10.3)
Chloride: 97 mmol/L — ABNORMAL LOW (ref 98–111)
Creatinine, Ser: 4.84 mg/dL — ABNORMAL HIGH (ref 0.44–1.00)
GFR calc Af Amer: 9 mL/min — ABNORMAL LOW (ref >60)
GFR calc non Af Amer: 8 mL/min — ABNORMAL LOW (ref >60)
Glucose, Bld: 98 mg/dL (ref 70–99)
Phosphorus: 5.4 mg/dL — ABNORMAL HIGH (ref 2.5–4.6)
Potassium: 4.4 mmol/L (ref 3.5–5.1)
Sodium: 137 mmol/L (ref 135–145)

## 2019-07-01 LAB — SARS CORONAVIRUS 2 (TAT 6-24 HRS): SARS Coronavirus 2: NEGATIVE

## 2019-07-01 MED ORDER — AMLODIPINE BESYLATE 5 MG PO TABS
5.0000 mg | ORAL_TABLET | Freq: Every day | ORAL | Status: DC
  Administered 2019-07-03 – 2019-07-07 (×2): 5 mg via ORAL
  Filled 2019-07-01 (×3): qty 1

## 2019-07-01 MED ORDER — CHLORHEXIDINE GLUCONATE CLOTH 2 % EX PADS
6.0000 | MEDICATED_PAD | Freq: Every day | CUTANEOUS | Status: DC
  Administered 2019-07-03 – 2019-07-08 (×6): 6 via TOPICAL

## 2019-07-01 NOTE — Progress Notes (Signed)
[facility] Progress Note    Assessment/ Plan:   1. ESRD, new this admission.  On THS schedule currently--> has TTS spot [place],  Maturing AVF.  Had HD on 11/23 with holiday schedule.  Note weights quite variable. - Continue with TTS schedule next tx on 11/28   2. Sepsis from infected back wound - NSG and ID following, will need spinal washout-- had it 11/19, wound vac in place, cultures growing proteus, s/p cefepime and now on ancef. --> Per primary team - plan is for ancef 2 gram with each HD through 08/05/2019  3. Anemia secondary to ESRD - on ESA, transfuse prn   4. 2HPTH< hyperphos on renvela    5. HTN: continue current regimen. Decrease amlodipine to 5 mg daily    6. Dispo: pending.  From a renal perspective acceptable for discharge    Subjective:    Still here awaiting insurance authorization for SNF per nursing report.  Last HD on 11/26 with 1.1 kg UF.  Her hydralazine was held yesterday midday due to hypotension per nursing.  Denies dizziness or cramping.  Review of systems: no shortness of breath or chest pain; no n/v; not making much urine at all - she will save if does   Objective:   BP 140/60 (BP Location: Right Arm)   Pulse 62   Temp 97.9 F (36.6 C) (Oral)   Resp 18   Ht 5\' 3"  (1.6 m)   Wt 91.8 kg   SpO2 94%   BMI 35.85 kg/m   Intake/Output Summary (Last 24 hours) at 07/01/2019 1225 Last data filed at 07/01/2019 0626 Gross per 24 hour  Intake 790 ml  Output 75 ml  Net 715 ml   Weight change: -5 kg  Physical Exam:  Gen: NAD, lying flat in bed in NAD CVS:RRR no m/r/g Resp: clear bilaterally and unlabored Abd: soft NT Ext: she has no LE edema NEURO: awake and pleasant; follows commands ACCESS: R IJ TDC clean/dry/ intact; LUE AVF + T/B  Imaging: No results found.  Labs: BMET Recent Labs  Lab 06/25/19 0947 06/27/19 1015 06/28/19 0315 06/29/19 1250 06/30/19 0323 07/01/19 0544  NA 133* 131* 135 134* 135 137  K 3.9 3.6  3.3* 4.4 4.3 4.4  CL 97* 93* 97* 97* 97* 97*  CO2 21* 24 23 21* 22 24  GLUCOSE 162* 112* 101* 105* 89 98  BUN 41* 40* 24* 36* 38* 25*  CREATININE 5.90* 5.96* 3.92* 6.16* 6.92* 4.84*  CALCIUM 8.7* 8.4* 8.2* 8.5* 8.5* 8.5*  PHOS 4.4 6.1*  --  4.8* 5.0* 5.4*   CBC Recent Labs  Lab 06/24/19 1453 06/25/19 0627 06/27/19 1015 06/29/19 1250  WBC  --  18.5* 16.7* 13.4*  HGB 8.7* 8.7* 9.0* 9.4*  HCT 28.2* 28.8* 30.3* 31.8*  MCV  --  87.8 88.6 89.8  PLT  --  163 123* 125*    Medications:    . amLODipine  10 mg Oral Daily  . atorvastatin  40 mg Oral q1800  . Chlorhexidine Gluconate Cloth  6 each Topical Q0600  . darbepoetin (ARANESP) injection - DIALYSIS  150 mcg Intravenous Q Sat-HD  . docusate sodium  100 mg Oral BID  . gabapentin  200 mg Oral QHS  . hydrALAZINE  100 mg Oral Q8H  . labetalol  200 mg Oral BID  . levothyroxine  100 mcg Oral QAC breakfast  . sevelamer carbonate  800 mg Oral TID WC    [name]  07/01/2019, 12:25 PM

## 2019-07-01 NOTE — Progress Notes (Addendum)
PROGRESS NOTE  Taylor Tyler QJF:[id number] DOB: 1941-08-01 DOA: 05/30/2019 PCP: McLean-Scocuzza, Nino Glow, MD  Brief History   78 yo with diabetes mellitus type 2 presently not on medication, chronic diastolic hypertension, chronic kidney disease stage III, anemia, hypothyroidism, hyperlipidemia.   She presented to the ED on 10/26 for dyspnea after a fall out of bed the night before and being on the floor for about 8 hrs. Her son found her in the AM. She was short of breath for at least 2 days.  In ED, noted to be wheezing. Suspected to have acute diastolic CHF and started on Lasix. Also noted to have AKI with Cr of5.74 and K of 5.3. Past Cr 2.64.  As it was felt that her renal failure was permanent, she was eventually started on dialysis. It was planned to d/c her to SNF however, course complicated by leukocytosis and fevers on 11/11. See below  She was transferred to [facility] on 11/11 to a telemetry bed. She is receiving HD with nephyrology. Infectious disease has been consulted for antibiotics guidance for her sepsis due to infected back wound. Neurosurgery has also been consulted for management of this wound. She has undergone debridement of the lumbar wound with removal of hardware from fusion in 2018 which has never healed. Initially the patient was treated with IV Vancomycin and cefepime to be given with HD. However wound cultures have now grown out Proteus and she has been changed to cefazolin 2 gm to be continued through 08/04/2019. She is awaiting SNF placement.   Consultants  . Infectous disease . Neurosurgery . Nephrology  Procedures  . Debridement of infected back wound with removal of hardware. [facility] HD  Antibiotics   Anti-infectives (From admission, onward)   Start     Dose/Rate Route Frequency Ordered Stop   07/02/19 1200  ceFAZolin (ANCEF) IVPB 2g/100 mL premix  Status:  Discontinued     2 g 200 mL/hr over 30 Minutes Intravenous Every T-Th-Sa (Hemodialysis) 06/27/19 0953  06/27/19 1001   07/02/19 1200  ceFAZolin (ANCEF) IVPB 2g/100 mL premix     2 g 200 mL/hr over 30 Minutes Intravenous Once per day on Tue Thu Sat 06/27/19 1001     06/27/19 1200  ceFAZolin (ANCEF) IVPB 2g/100 mL premix  Status:  Discontinued     2 g 200 mL/hr over 30 Minutes Intravenous Every M-W-F (Hemodialysis) 06/27/19 0953 06/27/19 1001   06/27/19 1200  ceFAZolin (ANCEF) IVPB 2g/100 mL premix     2 g 200 mL/hr over 30 Minutes Intravenous Once per day on Mon Wed Fri 06/27/19 1001 06/29/19 1313   06/25/19 1800  cefTRIAXone (ROCEPHIN) 2 g in sodium chloride 0.9 % 100 mL IVPB  Status:  Discontinued     2 g 200 mL/hr over 30 Minutes Intravenous Every 24 hours 06/25/19 1446 06/27/19 0953   06/23/19 1315  ceFEPIme (MAXIPIME) 2 g in sodium chloride 0.9 % 100 mL IVPB  Status:  Discontinued     2 g 200 mL/hr over 30 Minutes Intravenous To Surgery 06/23/19 1301 06/23/19 1624   06/23/19 1200  bacitracin 50,000 Units in sodium chloride 0.9 % 500 mL irrigation  Status:  Discontinued       As needed 06/23/19 1203 06/23/19 1359   06/16/19 1800  ceFEPIme (MAXIPIME) 2 g in sodium chloride 0.9 % 100 mL IVPB  Status:  Discontinued     2 g 200 mL/hr over 30 Minutes Intravenous Once per day on Tue Thu Sat 06/15/19 1904 06/25/19  1446   06/16/19 1200  vancomycin (VANCOCIN) IVPB 1000 mg/200 mL premix  Status:  Discontinued     1,000 mg 200 mL/hr over 60 Minutes Intravenous Every T-Th-Sa (Hemodialysis) 06/15/19 1904 06/24/19 1209   06/15/19 2030  vancomycin (VANCOCIN) 2,000 mg in sodium chloride 0.9 % 500 mL IVPB     2,000 mg 250 mL/hr over 120 Minutes Intravenous  Once 06/15/19 1902 06/16/19 0010   06/15/19 2000  ceFEPIme (MAXIPIME) 2 g in sodium chloride 0.9 % 100 mL IVPB     2 g 200 mL/hr over 30 Minutes Intravenous  Once 06/15/19 1902 06/15/19 2028   06/08/19 0600  cefUROXime (ZINACEF) 1.5 g in sodium chloride 0.9 % 100 mL IVPB     1.5 g 200 mL/hr over 30 Minutes Intravenous On call to O.R. 06/07/19  1519 06/08/19 1328    .  Subjective  The patient is complaining of back pain. No new complaints.  Objective   Vitals:  Vitals:   07/01/19 1127 07/01/19 1548  BP: 140/60 (!) 121/56  Pulse:  72  Resp: 18 16  Temp: 97.9 F (36.6 C) 98 F (36.7 C)  SpO2: 94% 98%   Exam:  Constitutional:  . The patient is awake, alert, and oriented x 3. No acute distress. Respiratory:  . No increased work of breathing. . No wheezes, rales, or rhonchi . No tactile fremitus Cardiovascular:  . Regular rate and rhythm . No murmurs, ectopy, or gallups. . No lateral PMI. No thrills. Abdomen:  . Abdomen is soft, non-tender, non-distended . No hernias, masses, or organomegaly . Normoactive bowel sounds.  Musculoskeletal:  . No cyanosis, clubbing, or edema Skin:  . No rashes, lesions, ulcers . palpation of skin: no induration or nodules Neurologic:  . CN 2-12 intact . Sensation all 4 extremities intact Psychiatric:  . Mental status o Mood, affect appropriate o Orientation to person, place, time  . judgment and insight appear intact .  I have personally reviewed the following:   Today's Data  . Vitals, BMP  Micro Data  . Wound cultures  Scheduled Meds: . [START ON 07/02/2019] amLODipine  5 mg Oral Daily  . atorvastatin  40 mg Oral q1800  . [START ON 07/02/2019] Chlorhexidine Gluconate Cloth  6 each Topical Q0600  . darbepoetin (ARANESP) injection - DIALYSIS  150 mcg Intravenous Q Sat-HD  . docusate sodium  100 mg Oral BID  . gabapentin  200 mg Oral QHS  . hydrALAZINE  100 mg Oral Q8H  . labetalol  200 mg Oral BID  . levothyroxine  100 mcg Oral QAC breakfast  . sevelamer carbonate  800 mg Oral TID WC   Continuous Infusions: . sodium chloride 10 mL/hr at 06/18/19 1746  . sodium chloride 10 mL/hr at 06/29/19 1231  . [START ON 07/02/2019]  ceFAZolin (ANCEF) IV      Principal Problem:   Acute CHF (congestive heart failure) (HCC) Active Problems:   Hypothyroidism   DM  (diabetes mellitus), type 2 with renal complications (HCC)   Anemia   Acute renal failure superimposed on stage 3 chronic kidney disease (HCC)   Essential hypertension   Pressure injury of skin   Fever   LOS: 32 days   A & P  Acute diastolic CHF (congestive heart failure): Most recent echocardiogram from 06/06/2019 demonstrated LVEF 55-60%. Grade II diastolic dysfunction. There are no regional wall motion abnormalities. LA and RA are mildly dilated. Mild AV sclerosis without stenosis. Diuresis has not been effective in managing  her volume overload.   Acute on chronic kidney disease stage 4 >>ESRD. Metabolic acidosis despite medical management, she did not diurese well and her Cr continued to rise. Diuresis was stopped. Nephrology has been consulted. She had a tunneled dialysis cath placed and underwent dialysis on 06/08/2019. She will be set up with an outpatient dialysis chair for Tu, Thu, Sat. Okay for dc from nephrology standpoint.  Sepsis from infected back wound with fever and leukocytosis as well as metabolic acidosis. Blood cultures x 2 have been obtained and have had no growth. Pt had small draining wound on her lumbar spine related to lumbar fusion from 2018. Neurosurgery has been consulted. The patient underwent I&D on 06/16/2019. Infectious disease has been consulted. She initially received IV Vancomycin and cefepime. However, wound has grown out proteus. She has been changed to IV cefazolin with HD to continue through 08/04/2019. Awaiting SNF placement.   Acute toxic metabolic encephalopathy with underlying dementia: Family has noted memory loss for about 4-5 yrs- she has had delulsions and paranoia for about 2 years. Much improved. Conversing very appropriately   Hypothyroidism: Pt has been continued on synthroid as at home.  DM ruled out: Patient's last A1c was 5.2. She is not on medication at home. Sugars remain stable at this time.  Anemia of Chronic Disease: Monitor  hemoglobin. Due to renal disease. She has received 1 unit of PRBC's in transfusion. Hemoglobin 8.5 today. Monitor and transfuse for hemoglobin less than 7.0.  Hypertension: Blood pressures are well controlled currently on Norvasc, labetalol, and hydralazine.  Hyperlipidemia: Continue lipitor.   Decubitus ulcers  Pressure Injury 06/06/19 Sacrum Left Stage I - Intact skin with non-blanchable redness of a localized area usually over a bony prominence. (Active)  06/06/19 1010  Location: Sacrum  Location Orientation: Left  Staging: Stage I - Intact skin with non-blanchable redness of a localized area usually over a bony prominence.  Wound Description (Comments):   Present on Admission: No    Pressure Injury 06/12/19 Sacrum Left Stage II - Partial thickness loss of dermis presenting as a shallow open ulcer with a red, pink wound bed without slough. Pink (Active)  06/12/19 2115  Location: Sacrum  Location Orientation: Left  Staging: Stage II - Partial thickness loss of dermis presenting as a shallow open ulcer with a red, pink wound bed without slough.  Wound Description (Comments): Pink  Present on Admission:     Pressure Injury 06/15/19 Buttocks Left Stage II - Partial thickness loss of dermis presenting as a shallow open ulcer with a red, pink wound bed without slough. 100 % pinkk (Active)  06/15/19 1102  Location: Buttocks  Location Orientation: Left  Staging: Stage II - Partial thickness loss of dermis presenting as a shallow open ulcer with a red, pink wound bed without slough.  Wound Description (Comments): 100 % pinkk  Present on Admission: No   I have seen and examined this patient myself. I have spent 35 minute in her evaluation and care.   DVT prophylaxis: SCD's Code Status: Full Family Communication: Pt in room, family not at bedside Disposition Plan: SNF pending insurance authorization  Colden Samaras, DO [facility] Direct contact: see [email or web address]   7PM-7AM contact night coverage as above 07/01/2019, 6:10 PM  LOS: 32 days

## 2019-07-01 NOTE — Progress Notes (Signed)
Patient unable to dc to SNF due to no COVID test within 48 hours. COVID test has been collected and results are pending.   Still waiting on insurance authorization. CSW will leave handoff for weekend CSW to follow up on status of authorization.

## 2019-07-02 LAB — RENAL FUNCTION PANEL
Albumin: 2.2 g/dL — ABNORMAL LOW (ref 3.5–5.0)
Anion gap: 14 (ref 5–15)
BUN: 39 mg/dL — ABNORMAL HIGH (ref 8–23)
CO2: 23 mmol/L (ref 22–32)
Calcium: 8.3 mg/dL — ABNORMAL LOW (ref 8.9–10.3)
Chloride: 98 mmol/L (ref 98–111)
Creatinine, Ser: 6.58 mg/dL — ABNORMAL HIGH (ref 0.44–1.00)
GFR calc Af Amer: 6 mL/min — ABNORMAL LOW (ref >60)
GFR calc non Af Amer: 6 mL/min — ABNORMAL LOW (ref >60)
Glucose, Bld: 90 mg/dL (ref 70–99)
Phosphorus: 6.1 mg/dL — ABNORMAL HIGH (ref 2.5–4.6)
Potassium: 4.8 mmol/L (ref 3.5–5.1)
Sodium: 135 mmol/L (ref 135–145)

## 2019-07-02 LAB — CBC WITH DIFFERENTIAL/PLATELET
Abs Immature Granulocytes: 0.14 10*3/uL — ABNORMAL HIGH (ref 0.00–0.07)
Basophils Absolute: 0.1 10*3/uL (ref 0.0–0.1)
Basophils Relative: 1 %
Eosinophils Absolute: 0.1 10*3/uL (ref 0.0–0.5)
Eosinophils Relative: 1 %
HCT: 28.6 % — ABNORMAL LOW (ref 36.0–46.0)
Hemoglobin: 8.5 g/dL — ABNORMAL LOW (ref 12.0–15.0)
Immature Granulocytes: 1 %
Lymphocytes Relative: 12 %
Lymphs Abs: 1.4 10*3/uL (ref 0.7–4.0)
MCH: 26.6 pg (ref 26.0–34.0)
MCHC: 29.7 g/dL — ABNORMAL LOW (ref 30.0–36.0)
MCV: 89.7 fL (ref 80.0–100.0)
Monocytes Absolute: 0.9 10*3/uL (ref 0.1–1.0)
Monocytes Relative: 8 %
Neutro Abs: 9.1 10*3/uL — ABNORMAL HIGH (ref 1.7–7.7)
Neutrophils Relative %: 77 %
Platelets: 125 10*3/uL — ABNORMAL LOW (ref 150–400)
RBC: 3.19 MIL/uL — ABNORMAL LOW (ref 3.87–5.11)
RDW: 18.1 % — ABNORMAL HIGH (ref 11.5–15.5)
WBC: 11.7 10*3/uL — ABNORMAL HIGH (ref 4.0–10.5)
nRBC: 0 % (ref 0.0–0.2)

## 2019-07-02 LAB — GLUCOSE, CAPILLARY
Glucose-Capillary: 114 mg/dL — ABNORMAL HIGH (ref 70–99)
Glucose-Capillary: 109 mg/dL — ABNORMAL HIGH (ref 70–99)
Glucose-Capillary: 103 mg/dL — ABNORMAL HIGH (ref 70–99)
Glucose-Capillary: 91 mg/dL (ref 70–99)
Glucose-Capillary: 93 mg/dL (ref 70–99)

## 2019-07-02 MED ORDER — ALUM & MAG HYDROXIDE-SIMETH 200-200-20 MG/5ML PO SUSP: 15.0000 mL | ORAL | Status: DC | PRN

## 2019-07-02 MED ORDER — LACTULOSE 10 GM/15ML PO SOLN
20.0000 g | Freq: Every day | ORAL | Status: DC
  Administered 2019-07-04 – 2019-07-05 (×2): 20 g via ORAL
  Administered 2019-07-06: 09:00:00 10 g via ORAL
  Administered 2019-07-07 – 2019-07-08 (×2): 20 g via ORAL
  Filled 2019-07-02 (×6): qty 30

## 2019-07-02 MED ORDER — HYDRALAZINE HCL 50 MG PO TABS
50.0000 mg | ORAL_TABLET | Freq: Three times a day (TID) | ORAL | Status: DC
  Administered 2019-07-02 – 2019-07-07 (×13): 50 mg via ORAL
  Filled 2019-07-02 (×15): qty 1

## 2019-07-02 MED ORDER — DARBEPOETIN ALFA 150 MCG/0.3ML IJ SOSY
PREFILLED_SYRINGE | INTRAMUSCULAR | Status: AC
  Administered 2019-07-02: 150 ug via INTRAVENOUS
  Filled 2019-07-02: qty 0.3

## 2019-07-02 MED ORDER — HEPARIN SODIUM (PORCINE) 1000 UNIT/ML IJ SOLN
INTRAMUSCULAR | Status: AC
  Administered 2019-07-02: 3400 [IU]
  Filled 2019-07-02: qty 4

## 2019-07-02 NOTE — Progress Notes (Signed)
PROGRESS NOTE  Taylor Tyler [id number]:[id number] DOB: 11-25-1940 DOA: 05/30/2019 PCP: McLean-Scocuzza, Nino Glow, MD  Brief History   78 yo with diabetes mellitus type 2 presently not on medication, chronic diastolic hypertension, chronic kidney disease stage III, anemia, hypothyroidism, hyperlipidemia.   She presented to the ED on 10/26 for dyspnea after a fall out of bed the night before and being on the floor for about 8 hrs. Her son found her in the AM. She was short of breath for at least 2 days.  In ED, noted to be wheezing. Suspected to have acute diastolic CHF and started on Lasix. Also noted to have AKI with Cr of5.74 and K of 5.3. Past Cr 2.64. As it was felt that her renal failure was permanent, she was eventually started on dialysis. It was planned to d/c her to SNF however, course complicated by leukocytosis and fevers on 11/11.  She was transferred to [facility] on 11/11 to a telemetry bed. She is receiving HD with nephyrology. Infectious disease has been consulted for antibiotics guidance for her sepsis due to infected back wound. Neurosurgery has also been consulted for management of this wound. She has undergone debridement of the lumbar wound with removal of hardware from fusion in 2018 which has never healed. Initially the patient was treated with IV Vancomycin and cefepime to be given with HD. However wound cultures have now grown out Proteus and she has been changed to cefazolin 2 gm to be continued through 08/04/2019. She is awaiting SNF placement.   Consultants  . Infectous disease . Neurosurgery . Nephrology  Procedures  . Debridement of infected back wound with removal of hardware. [facility] HD  Antibiotics   Anti-infectives (From admission, onward)   Start     Dose/Rate Route Frequency Ordered Stop   07/02/19 1200  ceFAZolin (ANCEF) IVPB 2g/100 mL premix  Status:  Discontinued     2 g 200 mL/hr over 30 Minutes Intravenous Every T-Th-Sa (Hemodialysis) 06/27/19 0953 06/27/19 1001    07/02/19 1200  ceFAZolin (ANCEF) IVPB 2g/100 mL premix     2 g 200 mL/hr over 30 Minutes Intravenous Once per day on Tue Thu Sat 06/27/19 1001     06/27/19 1200  ceFAZolin (ANCEF) IVPB 2g/100 mL premix  Status:  Discontinued     2 g 200 mL/hr over 30 Minutes Intravenous Every M-W-F (Hemodialysis) 06/27/19 0953 06/27/19 1001   06/27/19 1200  ceFAZolin (ANCEF) IVPB 2g/100 mL premix     2 g 200 mL/hr over 30 Minutes Intravenous Once per day on Mon Wed Fri 06/27/19 1001 06/29/19 1313   06/25/19 1800  cefTRIAXone (ROCEPHIN) 2 g in sodium chloride 0.9 % 100 mL IVPB  Status:  Discontinued     2 g 200 mL/hr over 30 Minutes Intravenous Every 24 hours 06/25/19 1446 06/27/19 0953   06/23/19 1315  ceFEPIme (MAXIPIME) 2 g in sodium chloride 0.9 % 100 mL IVPB  Status:  Discontinued     2 g 200 mL/hr over 30 Minutes Intravenous To Surgery 06/23/19 1301 06/23/19 1624   06/23/19 1200  bacitracin 50,000 Units in sodium chloride 0.9 % 500 mL irrigation  Status:  Discontinued       As needed 06/23/19 1203 06/23/19 1359   06/16/19 1800  ceFEPIme (MAXIPIME) 2 g in sodium chloride 0.9 % 100 mL IVPB  Status:  Discontinued     2 g 200 mL/hr over 30 Minutes Intravenous Once per day on Tue Thu Sat 06/15/19 1904 06/25/19 1446  06/16/19 1200  vancomycin (VANCOCIN) IVPB 1000 mg/200 mL premix  Status:  Discontinued     1,000 mg 200 mL/hr over 60 Minutes Intravenous Every T-Th-Sa (Hemodialysis) 06/15/19 1904 06/24/19 1209   06/15/19 2030  vancomycin (VANCOCIN) 2,000 mg in sodium chloride 0.9 % 500 mL IVPB     2,000 mg 250 mL/hr over 120 Minutes Intravenous  Once 06/15/19 1902 06/16/19 0010   06/15/19 2000  ceFEPIme (MAXIPIME) 2 g in sodium chloride 0.9 % 100 mL IVPB     2 g 200 mL/hr over 30 Minutes Intravenous  Once 06/15/19 1902 06/15/19 2028   06/08/19 0600  cefUROXime (ZINACEF) 1.5 g in sodium chloride 0.9 % 100 mL IVPB     1.5 g 200 mL/hr over 30 Minutes Intravenous On call to O.R. 06/07/19 1519 06/08/19 1328      Subjective  The patient is complaining of "upset stomach". Per nursing she has not had BM for 3 days. The patient wants medicine.  Objective   Vitals:  Vitals:   07/02/19 1124 07/02/19 1217  BP: (!) 130/51 (!) 140/51  Pulse: 72 78  Resp: 16 18  Temp:  98.1 F (36.7 C)  SpO2:  96%   Exam:  Constitutional:  . The patient is awake, alert, and oriented x 3. No acute distress. Respiratory:  . No increased work of breathing. . No wheezes, rales, or rhonchi . No tactile fremitus Cardiovascular:  . Regular rate and rhythm . No murmurs, ectopy, or gallups. . No lateral PMI. No thrills. Abdomen:  . Abdomen is soft, non-tender, non-distended . No hernias, masses, or organomegaly . Normoactive bowel sounds.  Musculoskeletal:  . No cyanosis, clubbing, or edema Skin:  . No rashes, lesions, ulcers . palpation of skin: no induration or nodules Neurologic:  . CN 2-12 intact . Sensation all 4 extremities intact Psychiatric:  . Mental status o Mood, affect appropriate o Orientation to person, place, time  . judgment and insight appear intact .  I have personally reviewed the following:   Today's Data  . Vitals, BMP, CBC  Micro Data  . Wound cultures  Scheduled Meds: . amLODipine  5 mg Oral Daily  . atorvastatin  40 mg Oral q1800  . Chlorhexidine Gluconate Cloth  6 each Topical Q0600  . darbepoetin (ARANESP) injection - DIALYSIS  150 mcg Intravenous Q Sat-HD  . docusate sodium  100 mg Oral BID  . gabapentin  200 mg Oral QHS  . hydrALAZINE  50 mg Oral Q8H  . labetalol  200 mg Oral BID  . lactulose  20 g Oral Daily  . levothyroxine  100 mcg Oral QAC breakfast  . sevelamer carbonate  800 mg Oral TID WC   Continuous Infusions: . sodium chloride 10 mL/hr at 06/18/19 1746  . sodium chloride 10 mL/hr at 06/29/19 1231  .  ceFAZolin (ANCEF) IV 2 g (07/02/19 1555)    Principal Problem:   Acute CHF (congestive heart failure) (HCC) Active Problems:   Hypothyroidism    DM (diabetes mellitus), type 2 with renal complications (HCC)   Anemia   Acute renal failure superimposed on stage 3 chronic kidney disease (HCC)   Essential hypertension   Pressure injury of skin   Fever   LOS: 33 days   A & P  Acute diastolic CHF (congestive heart failure): Most recent echocardiogram from 06/06/2019 demonstrated LVEF 55-60%. Grade II diastolic dysfunction. There are no regional wall motion abnormalities. LA and RA are mildly dilated. Mild AV sclerosis without stenosis.  Diuresis has not been effective in managing her volume overload.   Acute on chronic kidney disease stage 4 >>ESRD. Metabolic acidosis despite medical management, she did not diurese well and her Cr continued to rise. Diuresis was stopped. Nephrology has been consulted. She had a tunneled dialysis cath placed and underwent dialysis on 06/08/2019. She will be set up with an outpatient dialysis chair for Tu, Thu, Sat. Okay for dc from nephrology standpoint.  Constipation: Causing abdominal discomfort. Start daily lactulose.  Sepsis from infected back wound with fever and leukocytosis as well as metabolic acidosis. Blood cultures x 2 have been obtained and have had no growth. Pt had small draining wound on her lumbar spine related to lumbar fusion from 2018. Neurosurgery has been consulted. The patient underwent I&D on 06/16/2019. Infectious disease has been consulted. She initially received IV Vancomycin and cefepime. However, wound has grown out proteus. She has been changed to IV cefazolin with HD to continue through 08/04/2019. Awaiting SNF placement.   Acute toxic metabolic encephalopathy with underlying dementia: Family has noted memory loss for about 4-5 yrs- she has had delulsions and paranoia for about 2 years. Much improved. Conversing very appropriately .  Hypothyroidism: Pt has been continued on synthroid as at home.  DM ruled out: Patient's last A1c was 5.2. She is not on medication at home.  Sugars remain stable at this time.  Anemia of Chronic Disease: Monitor hemoglobin. Due to renal disease. She has received 1 unit of PRBC's in transfusion. Hemoglobin 8.5 today. Monitor and transfuse for hemoglobin less than 7.0.  Hypertension: Blood pressures are well controlled currently on Norvasc, labetalol, and hydralazine.  Hyperlipidemia: Continue lipitor.   Decubitus ulcers  Pressure Injury 06/06/19 Sacrum Left Stage I - Intact skin with non-blanchable redness of a localized area usually over a bony prominence. (Active)  06/06/19 1010  Location: Sacrum  Location Orientation: Left  Staging: Stage I - Intact skin with non-blanchable redness of a localized area usually over a bony prominence.  Wound Description (Comments):   Present on Admission: No    Pressure Injury 06/12/19 Sacrum Left Stage II - Partial thickness loss of dermis presenting as a shallow open ulcer with a red, pink wound bed without slough. Pink (Active)  06/12/19 2115  Location: Sacrum  Location Orientation: Left  Staging: Stage II - Partial thickness loss of dermis presenting as a shallow open ulcer with a red, pink wound bed without slough.  Wound Description (Comments): Pink  Present on Admission:     Pressure Injury 06/15/19 Buttocks Left Stage II - Partial thickness loss of dermis presenting as a shallow open ulcer with a red, pink wound bed without slough. 100 % pinkk (Active)  06/15/19 1102  Location: Buttocks  Location Orientation: Left  Staging: Stage II - Partial thickness loss of dermis presenting as a shallow open ulcer with a red, pink wound bed without slough.  Wound Description (Comments): 100 % pinkk  Present on Admission: No   I have seen and examined this patient myself. I have spent 32 minute in her evaluation and care.   DVT prophylaxis: SCD's Code Status: Full Family Communication: Pt in room, family not at bedside Disposition Plan: SNF pending insurance authorization   Shagun Wordell, DO [facility] Direct contact: see [email or web address]  7PM-7AM contact night coverage as above 07/02/2019, 4:44 PM  LOS: 32 days

## 2019-07-02 NOTE — Progress Notes (Signed)
Pt appetite is very poor and intake for all meals was 0. She is refusing to eat, but take sips of fluid throughout the day.

## 2019-07-02 NOTE — TOC Progression Note (Addendum)
Transition of Care [facility]) - Progression Note    Patient Details  Name: Taylor Tyler MRN: [id number] Date of Birth: 07-30-1941  Transition of Care [employer]) CM/SW [place], LCSW Phone Number: 07/02/2019, 10:15 AM  Clinical Narrative:   CSW called [facility] for update on insurance authorization. Per [facility] they have no weekend staff that are able to provide an update. CSW left a message with [employer] for an update on patient's insurance authorization. CSW spoke with patient's son and provided him an update that CSW is trying to see status of insurance authorization. CSW was informed that per patient's son he signed the dialysis paperwork Wednesday.   10:50AM: CSW received auth number [id number] from [facility])  Expected Discharge Plan: [place] Barriers to Discharge: Insurance Authorization  Expected Discharge Plan and Services Expected Discharge Plan: [place] In-house Referral: NA Discharge Planning Services: CM Consult Post Acute Care Choice: [place] Living arrangements for the past 2 months: Single Family Home                 DME Arranged: (NA)         HH Arranged: RN, PT [place] Agency: [place] Date [facility] Agency Contacted: 06/10/19 Time [place]: [id number] Representative spoke with at [place]: cory   Social Determinants of Health ([place]) Interventions    Readmission Risk Interventions No flowsheet data found.

## 2019-07-02 NOTE — Progress Notes (Signed)
[facility] Progress Note    Assessment/ Plan:   1. ESRD, new this admission.  On THS schedule currently--> has TTS spot [place],  Maturing AVF.  Had HD on 11/23 with holiday schedule.  Note weights quite variable. - HD today  - Continue with TTS schedule   2. Sepsis from infected back wound - NSG and ID following, will need spinal washout-- had it 11/19, wound vac in place, cultures growing proteus, s/p cefepime and now on ancef. --> Per primary team - plan is for ancef 2 gram with each HD through 08/05/2019  3. Anemia secondary to ESRD - on ESA - aranesp 150 mcg weekly, transfuse prn  4. 2HPTH< hyperphos on renvela    5. HTN: continue current regimen. Decreased amlodipine to 5 mg daily. Will decrease hydralazine and follow  6. Dispo: pending.  From a renal perspective acceptable for discharge    Subjective:    Still here awaiting insurance authorization for SNF per nursing report - pt hasn't heard any updates.  Seen and examined on HD at 9:24 am with BP 123/54 and HR 76; procedure supervised - tolerating tx.  Tunneled catheter in use.  Review of systems: no shortness of breath or chest pain; no n/v   Objective:   BP (!) 123/54   Pulse 70   Temp 97.9 F (36.6 C) (Oral)   Resp 16   Ht 5\' 3"  (1.6 m)   Wt 99 kg   SpO2 95%   BMI 38.66 kg/m   Intake/Output Summary (Last 24 hours) at 07/02/2019 8119 Last data filed at 07/02/2019 0700 Gross per 24 hour  Intake 0 ml  Output 25 ml  Net -25 ml   Weight change: 6 kg  Physical Exam:  Gen: NAD, lying flat in bed in NAD CVS:RRR no m/r/g Resp: clear bilaterally and unlabored Abd: soft NT Ext: no LE edema appreciated NEURO: awake and pleasant; follows commands ACCESS: R IJ TDC clean/dry/ intact; LUE AVF + T/B  Imaging: No results found.  Labs: BMET Recent Labs  Lab 06/25/19 0947 06/27/19 1015 06/28/19 0315 06/29/19 1250 06/30/19 0323 07/01/19 0544 07/02/19 0235  NA 133* 131* 135 134* 135 137  135  K 3.9 3.6 3.3* 4.4 4.3 4.4 4.8  CL 97* 93* 97* 97* 97* 97* 98  CO2 21* 24 23 21* 22 24 23   GLUCOSE 162* 112* 101* 105* 89 98 90  BUN 41* 40* 24* 36* 38* 25* 39*  CREATININE 5.90* 5.96* 3.92* 6.16* 6.92* 4.84* 6.58*  CALCIUM 8.7* 8.4* 8.2* 8.5* 8.5* 8.5* 8.3*  PHOS 4.4 6.1*  --  4.8* 5.0* 5.4* 6.1*   CBC Recent Labs  Lab 06/27/19 1015 06/29/19 1250 07/02/19 0235  WBC 16.7* 13.4* 11.7*  NEUTROABS  --   --  9.1*  HGB 9.0* 9.4* 8.5*  HCT 30.3* 31.8* 28.6*  MCV 88.6 89.8 89.7  PLT 123* 125* 125*    Medications:    . amLODipine  5 mg Oral Daily  . atorvastatin  40 mg Oral q1800  . Chlorhexidine Gluconate Cloth  6 each Topical Q0600  . darbepoetin (ARANESP) injection - DIALYSIS  150 mcg Intravenous Q Sat-HD  . docusate sodium  100 mg Oral BID  . gabapentin  200 mg Oral QHS  . hydrALAZINE  100 mg Oral Q8H  . labetalol  200 mg Oral BID  . levothyroxine  100 mcg Oral QAC breakfast  . sevelamer carbonate  800 mg Oral TID WC    Cecille Rubin  [place]  07/02/2019, 9:28 AM

## 2019-07-03 LAB — CBC WITH DIFFERENTIAL/PLATELET
Abs Immature Granulocytes: 0.13 10*3/uL — ABNORMAL HIGH (ref 0.00–0.07)
Basophils Absolute: 0.1 10*3/uL (ref 0.0–0.1)
Basophils Relative: 1 %
Eosinophils Absolute: 0.1 10*3/uL (ref 0.0–0.5)
Eosinophils Relative: 1 %
HCT: 29.6 % — ABNORMAL LOW (ref 36.0–46.0)
Hemoglobin: 8.8 g/dL — ABNORMAL LOW (ref 12.0–15.0)
Immature Granulocytes: 1 %
Lymphocytes Relative: 15 %
Lymphs Abs: 1.8 10*3/uL (ref 0.7–4.0)
MCH: 27 pg (ref 26.0–34.0)
MCHC: 29.7 g/dL — ABNORMAL LOW (ref 30.0–36.0)
MCV: 90.8 fL (ref 80.0–100.0)
Monocytes Absolute: 0.9 10*3/uL (ref 0.1–1.0)
Monocytes Relative: 7 %
Neutro Abs: 9.2 10*3/uL — ABNORMAL HIGH (ref 1.7–7.7)
Neutrophils Relative %: 75 %
Platelets: 124 10*3/uL — ABNORMAL LOW (ref 150–400)
RBC: 3.26 MIL/uL — ABNORMAL LOW (ref 3.87–5.11)
RDW: 18.4 % — ABNORMAL HIGH (ref 11.5–15.5)
WBC: 12.2 10*3/uL — ABNORMAL HIGH (ref 4.0–10.5)
nRBC: 0 % (ref 0.0–0.2)

## 2019-07-03 LAB — GLUCOSE, CAPILLARY
Glucose-Capillary: 94 mg/dL (ref 70–99)
Glucose-Capillary: 106 mg/dL — ABNORMAL HIGH (ref 70–99)
Glucose-Capillary: 79 mg/dL (ref 70–99)
Glucose-Capillary: 84 mg/dL (ref 70–99)

## 2019-07-03 LAB — RENAL FUNCTION PANEL
Albumin: 2.3 g/dL — ABNORMAL LOW (ref 3.5–5.0)
Anion gap: 17 — ABNORMAL HIGH (ref 5–15)
BUN: 22 mg/dL (ref 8–23)
CO2: 24 mmol/L (ref 22–32)
Calcium: 8.6 mg/dL — ABNORMAL LOW (ref 8.9–10.3)
Chloride: 95 mmol/L — ABNORMAL LOW (ref 98–111)
Creatinine, Ser: 4.41 mg/dL — ABNORMAL HIGH (ref 0.44–1.00)
GFR calc Af Amer: 11 mL/min — ABNORMAL LOW (ref >60)
GFR calc non Af Amer: 9 mL/min — ABNORMAL LOW (ref >60)
Glucose, Bld: 94 mg/dL (ref 70–99)
Phosphorus: 3.8 mg/dL (ref 2.5–4.6)
Potassium: 3.9 mmol/L (ref 3.5–5.1)
Sodium: 136 mmol/L (ref 135–145)

## 2019-07-03 NOTE — Progress Notes (Signed)
[facility] Progress Note    Assessment/ Plan:   1. ESRD, new this admission.  On THS schedule currently--> has TTS spot [place],  Maturing AVF.  Had HD on 11/23 with holiday schedule.  Note weights quite variable. - Continue with TTS schedule   2. Sepsis from infected back wound - NSG and ID following, will need spinal washout-- had it 11/19, wound vac in place, cultures growing proteus, s/p cefepime and now on ancef. --> Per primary team - plan is for ancef 2 gram with each HD through 08/05/2019  3. Anemia secondary to ESRD - on ESA - aranesp 150 mcg weekly, transfuse prn  4. 2HPTH< hyperphos on renvela    5. HTN:  Decreased amlodipine to 5 mg daily. Have also just decreased hydralazine.  Follow for now   6. Dispo: pending.  From a renal perspective acceptable for discharge    Subjective:    Still waiting on SNF.  Had HD yesterday with 1.3 kg UF.  she missed amlodipine and AM labetalol on 11/28  Review of systems: no shortness of breath or chest pain; no n/v; no dizziness or cramping.   Objective:   BP (!) 130/58 (BP Location: Right Arm)   Pulse 69   Temp 97.6 F (36.4 C)   Resp 19   Ht 5\' 3"  (1.6 m)   Wt 96 kg   SpO2 99%   BMI 37.49 kg/m   Intake/Output Summary (Last 24 hours) at 07/03/2019 1443 Last data filed at 07/03/2019 0500 Gross per 24 hour  Intake -  Output 100 ml  Net -100 ml   Weight change: -3 kg  Physical Exam:  Gen: NAD, lying flat in bed in NAD  CVS:RRR no m/r/g Resp: clear bilaterally and unlabored Abd: soft NT Ext: no LE edema on exam NEURO: awake and pleasant; follows commands ACCESS: R IJ TDC clean/dry/ intact; LUE AVF with bruit and thrill   Imaging: No results found.  Labs: BMET Recent Labs  Lab 06/27/19 1015 06/28/19 0315 06/29/19 1250 06/30/19 0323 07/01/19 0544 07/02/19 0235 07/03/19 0335  NA 131* 135 134* 135 137 135 136  K 3.6 3.3* 4.4 4.3 4.4 4.8 3.9  CL 93* 97* 97* 97* 97* 98 95*  CO2 24 23 21*  22 24 23 24   GLUCOSE 112* 101* 105* 89 98 90 94  BUN 40* 24* 36* 38* 25* 39* 22  CREATININE 5.96* 3.92* 6.16* 6.92* 4.84* 6.58* 4.41*  CALCIUM 8.4* 8.2* 8.5* 8.5* 8.5* 8.3* 8.6*  PHOS 6.1*  --  4.8* 5.0* 5.4* 6.1* 3.8   CBC Recent Labs  Lab 06/27/19 1015 06/29/19 1250 07/02/19 0235 07/03/19 0335  WBC 16.7* 13.4* 11.7* 12.2*  NEUTROABS  --   --  9.1* 9.2*  HGB 9.0* 9.4* 8.5* 8.8*  HCT 30.3* 31.8* 28.6* 29.6*  MCV 88.6 89.8 89.7 90.8  PLT 123* 125* 125* 124*    Medications:    . amLODipine  5 mg Oral Daily  . atorvastatin  40 mg Oral q1800  . Chlorhexidine Gluconate Cloth  6 each Topical Q0600  . darbepoetin (ARANESP) injection - DIALYSIS  150 mcg Intravenous Q Sat-HD  . docusate sodium  100 mg Oral BID  . gabapentin  200 mg Oral QHS  . hydrALAZINE  50 mg Oral Q8H  . labetalol  200 mg Oral BID  . lactulose  20 g Oral Daily  . levothyroxine  100 mcg Oral QAC breakfast  . sevelamer carbonate  800 mg Oral  TID WC    [name]  07/03/2019, 2:43 PM

## 2019-07-03 NOTE — Progress Notes (Signed)
PROGRESS NOTE  Taylor Tyler BHA:[id number] DOB: 10-08-1940 DOA: 05/30/2019 PCP: McLean-Scocuzza, Nino Glow, MD  Brief History   78 yo with diabetes mellitus type 2 presently not on medication, chronic diastolic hypertension, chronic kidney disease stage III, anemia, hypothyroidism, hyperlipidemia.   She presented to the ED on 10/26 for dyspnea after a fall out of bed the night before and being on the floor for about 8 hrs. Her son found her in the AM. She was short of breath for at least 2 days.  In ED, noted to be wheezing. Suspected to have acute diastolic CHF and started on Lasix. Also noted to have AKI with Cr of5.74 and K of 5.3. Past Cr 2.64. As it was felt that her renal failure was permanent, she was eventually started on dialysis. It was planned to d/c her to SNF however, course complicated by leukocytosis and fevers on 11/11.  She was transferred to [facility] on 11/11 to a telemetry bed. She is receiving HD with nephyrology. Infectious disease has been consulted for antibiotics guidance for her sepsis due to infected back wound. Neurosurgery has also been consulted for management of this wound. She has undergone debridement of the lumbar wound with removal of hardware from fusion in 2018 which has never healed. Initially the patient was treated with IV Vancomycin and cefepime to be given with HD. However wound cultures have now grown out Proteus and she has been changed to cefazolin 2 gm to be continued through 08/04/2019. She is awaiting SNF placement.   Consultants  . Infectous disease . Neurosurgery . Nephrology  Procedures  . Debridement of infected back wound with removal of hardware. [facility] HD  Antibiotics   Anti-infectives (From admission, onward)   Start     Dose/Rate Route Frequency Ordered Stop   07/02/19 1200  ceFAZolin (ANCEF) IVPB 2g/100 mL premix  Status:  Discontinued     2 g 200 mL/hr over 30 Minutes Intravenous Every T-Th-Sa (Hemodialysis) 06/27/19 0953 06/27/19 1001    07/02/19 1200  ceFAZolin (ANCEF) IVPB 2g/100 mL premix     2 g 200 mL/hr over 30 Minutes Intravenous Once per day on Tue Thu Sat 06/27/19 1001     06/27/19 1200  ceFAZolin (ANCEF) IVPB 2g/100 mL premix  Status:  Discontinued     2 g 200 mL/hr over 30 Minutes Intravenous Every M-W-F (Hemodialysis) 06/27/19 0953 06/27/19 1001   06/27/19 1200  ceFAZolin (ANCEF) IVPB 2g/100 mL premix     2 g 200 mL/hr over 30 Minutes Intravenous Once per day on Mon Wed Fri 06/27/19 1001 06/29/19 1313   06/25/19 1800  cefTRIAXone (ROCEPHIN) 2 g in sodium chloride 0.9 % 100 mL IVPB  Status:  Discontinued     2 g 200 mL/hr over 30 Minutes Intravenous Every 24 hours 06/25/19 1446 06/27/19 0953   06/23/19 1315  ceFEPIme (MAXIPIME) 2 g in sodium chloride 0.9 % 100 mL IVPB  Status:  Discontinued     2 g 200 mL/hr over 30 Minutes Intravenous To Surgery 06/23/19 1301 06/23/19 1624   06/23/19 1200  bacitracin 50,000 Units in sodium chloride 0.9 % 500 mL irrigation  Status:  Discontinued       As needed 06/23/19 1203 06/23/19 1359   06/16/19 1800  ceFEPIme (MAXIPIME) 2 g in sodium chloride 0.9 % 100 mL IVPB  Status:  Discontinued     2 g 200 mL/hr over 30 Minutes Intravenous Once per day on Tue Thu Sat 06/15/19 1904 06/25/19 1446  06/16/19 1200  vancomycin (VANCOCIN) IVPB 1000 mg/200 mL premix  Status:  Discontinued     1,000 mg 200 mL/hr over 60 Minutes Intravenous Every T-Th-Sa (Hemodialysis) 06/15/19 1904 06/24/19 1209   06/15/19 2030  vancomycin (VANCOCIN) 2,000 mg in sodium chloride 0.9 % 500 mL IVPB     2,000 mg 250 mL/hr over 120 Minutes Intravenous  Once 06/15/19 1902 06/16/19 0010   06/15/19 2000  ceFEPIme (MAXIPIME) 2 g in sodium chloride 0.9 % 100 mL IVPB     2 g 200 mL/hr over 30 Minutes Intravenous  Once 06/15/19 1902 06/15/19 2028   06/08/19 0600  cefUROXime (ZINACEF) 1.5 g in sodium chloride 0.9 % 100 mL IVPB     1.5 g 200 mL/hr over 30 Minutes Intravenous On call to O.R. 06/07/19 1519 06/08/19 1328      Subjective  The patient is resting comfortably. No new complaints.  Objective   Vitals:  Vitals:   07/03/19 1247 07/03/19 1650  BP: (!) 130/58 (!) 120/45  Pulse: 69 67  Resp: 19 20  Temp: 97.6 F (36.4 C) 98.3 F (36.8 C)  SpO2: 99% 97%   Exam:  Constitutional:  . The patient is awake, alert, and oriented x 3. No acute distress. Respiratory:  . No increased work of breathing. . No wheezes, rales, or rhonchi . No tactile fremitus Cardiovascular:  . Regular rate and rhythm . No murmurs, ectopy, or gallups. . No lateral PMI. No thrills. Abdomen:  . Abdomen is soft, non-tender, non-distended . No hernias, masses, or organomegaly . Normoactive bowel sounds.  Musculoskeletal:  . No cyanosis, clubbing, or edema Skin:  . No rashes, lesions, ulcers . palpation of skin: no induration or nodules Neurologic:  . CN 2-12 intact . Sensation all 4 extremities intact Psychiatric:  . Mental status o Mood, affect appropriate o Orientation to person, place, time  . judgment and insight appear intact .  I have personally reviewed the following:   Today's Data  . Vitals, BMP, CBC, Glucoses  Micro Data  . Wound cultures  Scheduled Meds: . amLODipine  5 mg Oral Daily  . atorvastatin  40 mg Oral q1800  . Chlorhexidine Gluconate Cloth  6 each Topical Q0600  . darbepoetin (ARANESP) injection - DIALYSIS  150 mcg Intravenous Q Sat-HD  . docusate sodium  100 mg Oral BID  . gabapentin  200 mg Oral QHS  . hydrALAZINE  50 mg Oral Q8H  . labetalol  200 mg Oral BID  . lactulose  20 g Oral Daily  . levothyroxine  100 mcg Oral QAC breakfast  . sevelamer carbonate  800 mg Oral TID WC   Continuous Infusions: . sodium chloride 10 mL/hr at 06/18/19 1746  . sodium chloride 10 mL/hr at 06/29/19 1231  .  ceFAZolin (ANCEF) IV 2 g (07/02/19 1555)    Principal Problem:   Acute CHF (congestive heart failure) (HCC) Active Problems:   Hypothyroidism   DM (diabetes mellitus), type 2  with renal complications (HCC)   Anemia   Acute renal failure superimposed on stage 3 chronic kidney disease (HCC)   Essential hypertension   Pressure injury of skin   Fever   LOS: 34 days   A & P  Acute diastolic CHF (congestive heart failure): Most recent echocardiogram from 06/06/2019 demonstrated LVEF 55-60%. Grade II diastolic dysfunction. There are no regional wall motion abnormalities. LA and RA are mildly dilated. Mild AV sclerosis without stenosis. Diuresis has not been effective in managing her volume  overload. HD is being used for volume control.  Acute on chronic kidney disease stage 4 >>ESRD. Metabolic acidosis despite medical management, she did not diurese well and her Cr continued to rise. Diuresis was stopped. Nephrology has been consulted. She had a tunneled dialysis cath placed and underwent dialysis on 06/08/2019. She will be set up with an outpatient dialysis chair for Tu, Thu, Sat. Okay for dc from nephrology standpoint.  Constipation: Pt is refusing lactulose. Last BM 07/01/2019. Will start daily docusate.  Sepsis from infected back wound with fever and leukocytosis as well as metabolic acidosis. Blood cultures x 2 have been obtained and have had no growth. Pt had small draining wound on her lumbar spine related to lumbar fusion from 2018. Neurosurgery has been consulted. The patient underwent I&D on 06/16/2019. Infectious disease has been consulted. She initially received IV Vancomycin and cefepime. However, wound has grown out proteus. She has been changed to IV cefazolin with HD to continue through 08/04/2019. Awaiting SNF placement.   Acute toxic metabolic encephalopathy with underlying dementia: Family has noted memory loss for about 4-5 yrs- she has had delulsions and paranoia for about 2 years. Much improved. Conversing very appropriately .  Hypothyroidism: Pt has been continued on synthroid as at home.  DM ruled out: Patient's last A1c was 5.2. She is not on  medication at home. Blood sugars are running 79 - 106 over the past 24 hours.  Anemia of Chronic Disease: Monitor hemoglobin. Due to renal disease. She has received 1 unit of PRBC's in transfusion. Hemoglobin 8.8 today. Monitor and transfuse for hemoglobin less than 7.0.  Hypertension: Blood pressures are well controlled currently on Norvasc, labetalol, and hydralazine.  Hyperlipidemia: Continue lipitor.   Decubitus ulcers  Pressure Injury 06/06/19 Sacrum Left Stage I - Intact skin with non-blanchable redness of a localized area usually over a bony prominence. (Active)  06/06/19 1010  Location: Sacrum  Location Orientation: Left  Staging: Stage I - Intact skin with non-blanchable redness of a localized area usually over a bony prominence.  Wound Description (Comments):   Present on Admission: No    Pressure Injury 06/12/19 Sacrum Left Stage II - Partial thickness loss of dermis presenting as a shallow open ulcer with a red, pink wound bed without slough. Pink (Active)  06/12/19 2115  Location: Sacrum  Location Orientation: Left  Staging: Stage II - Partial thickness loss of dermis presenting as a shallow open ulcer with a red, pink wound bed without slough.  Wound Description (Comments): Pink  Present on Admission:     Pressure Injury 06/15/19 Buttocks Left Stage II - Partial thickness loss of dermis presenting as a shallow open ulcer with a red, pink wound bed without slough. 100 % pinkk (Active)  06/15/19 1102  Location: Buttocks  Location Orientation: Left  Staging: Stage II - Partial thickness loss of dermis presenting as a shallow open ulcer with a red, pink wound bed without slough.  Wound Description (Comments): 100 % pinkk  Present on Admission: No   I have seen and examined this patient myself. I have spent 30 minute in her evaluation and care.   DVT prophylaxis: SCD's Code Status: Full Family Communication: Pt in room, family not at bedside Disposition  Plan: SNF pending insurance authorization  Mischelle Reeg, DO [facility] Direct contact: see [email or web address]  7PM-7AM contact night coverage as above 07/03/2019, 5:35 PM  LOS: 32 days

## 2019-07-04 LAB — GLUCOSE, CAPILLARY
Glucose-Capillary: 75 mg/dL (ref 70–99)
Glucose-Capillary: 97 mg/dL (ref 70–99)
Glucose-Capillary: 98 mg/dL (ref 70–99)
Glucose-Capillary: 75 mg/dL (ref 70–99)

## 2019-07-04 LAB — CBC WITH DIFFERENTIAL/PLATELET
Abs Immature Granulocytes: 0.13 10*3/uL — ABNORMAL HIGH (ref 0.00–0.07)
Basophils Absolute: 0.1 10*3/uL (ref 0.0–0.1)
Basophils Relative: 1 %
Eosinophils Absolute: 0.2 10*3/uL (ref 0.0–0.5)
Eosinophils Relative: 2 %
HCT: 29.9 % — ABNORMAL LOW (ref 36.0–46.0)
Hemoglobin: 8.9 g/dL — ABNORMAL LOW (ref 12.0–15.0)
Immature Granulocytes: 2 %
Lymphocytes Relative: 18 %
Lymphs Abs: 1.5 10*3/uL (ref 0.7–4.0)
MCH: 27.1 pg (ref 26.0–34.0)
MCHC: 29.8 g/dL — ABNORMAL LOW (ref 30.0–36.0)
MCV: 91.2 fL (ref 80.0–100.0)
Monocytes Absolute: 0.6 10*3/uL (ref 0.1–1.0)
Monocytes Relative: 8 %
Neutro Abs: 5.7 10*3/uL (ref 1.7–7.7)
Neutrophils Relative %: 69 %
Platelets: 148 10*3/uL — ABNORMAL LOW (ref 150–400)
RBC: 3.28 MIL/uL — ABNORMAL LOW (ref 3.87–5.11)
RDW: 18.4 % — ABNORMAL HIGH (ref 11.5–15.5)
WBC: 8.2 10*3/uL (ref 4.0–10.5)
nRBC: 0.2 % (ref 0.0–0.2)

## 2019-07-04 LAB — RENAL FUNCTION PANEL
Albumin: 2.3 g/dL — ABNORMAL LOW (ref 3.5–5.0)
Anion gap: 16 — ABNORMAL HIGH (ref 5–15)
BUN: 36 mg/dL — ABNORMAL HIGH (ref 8–23)
CO2: 24 mmol/L (ref 22–32)
Calcium: 8.5 mg/dL — ABNORMAL LOW (ref 8.9–10.3)
Chloride: 92 mmol/L — ABNORMAL LOW (ref 98–111)
Creatinine, Ser: 6.39 mg/dL — ABNORMAL HIGH (ref 0.44–1.00)
GFR calc Af Amer: 7 mL/min — ABNORMAL LOW (ref >60)
GFR calc non Af Amer: 6 mL/min — ABNORMAL LOW (ref >60)
Glucose, Bld: 82 mg/dL (ref 70–99)
Phosphorus: 5.2 mg/dL — ABNORMAL HIGH (ref 2.5–4.6)
Potassium: 3.9 mmol/L (ref 3.5–5.1)
Sodium: 132 mmol/L — ABNORMAL LOW (ref 135–145)

## 2019-07-04 MED ORDER — LACTULOSE 10 GM/15ML PO SOLN: 20.0000 g | Freq: Every day | ORAL | 0 refills | Status: DC

## 2019-07-04 MED ORDER — LABETALOL HCL 200 MG PO TABS: 200.0000 mg | ORAL_TABLET | Freq: Two times a day (BID) | ORAL | 0 refills | Status: DC

## 2019-07-04 MED ORDER — SEVELAMER CARBONATE 800 MG PO TABS: 800.0000 mg | ORAL_TABLET | Freq: Three times a day (TID) | ORAL | 0 refills | Status: DC

## 2019-07-04 MED ORDER — LEVALBUTEROL HCL 1.25 MG/0.5ML IN NEBU: 1.2500 mg | INHALATION_SOLUTION | Freq: Four times a day (QID) | RESPIRATORY_TRACT | 12 refills | Status: DC | PRN

## 2019-07-04 MED ORDER — CEFAZOLIN SODIUM-DEXTROSE 2-4 GM/100ML-% IV SOLN: 2.0000 g | INTRAVENOUS | 0 refills | Status: AC

## 2019-07-04 MED ORDER — GABAPENTIN 100 MG PO CAPS: 200.0000 mg | ORAL_CAPSULE | Freq: Every day | ORAL | 0 refills | Status: AC

## 2019-07-04 NOTE — TOC Progression Note (Signed)
Transition of Care [facility]) - Progression Note    Patient Details  Name: Taylor Tyler MRN: [id number] Date of Birth: 1941/05/19  Transition of Care [facility]) CM/SW [place], [place] Phone Number: 07/04/2019, 11:57 AM  Clinical Narrative:     CSW spoke with patient's son, [name]- advised his mother will be discharged today. Spoke with Neoma Laming, Admission Coordinator for SNF- she informed the CSW, patient's son needs to come and complete admissions paperwork.  Patient's son states he is not sure how soon he can go and sign patient's admissions paperwork. [place] states they can not email admission paperwork, it must be completed in person. SNF attempted to call patient's son and requested CSW inform him to contact the SNF. CSW sent text message to  [place] requesting her contact [facility] as soon as possible.   CSW waiting on call back from [facility], MSW, [place] Social Worker [phone]   Expected Discharge Plan: [place] Barriers to Discharge: Insurance Authorization  Expected Discharge Plan and Services Expected Discharge Plan: [place] In-house Referral: NA Discharge Planning Services: CM Consult Post Acute Care Choice: [place] Living arrangements for the past 2 months: Single Family Home                 DME Arranged: (NA)         HH Arranged: RN, PT [place] Agency: [place] Date [facility] Agency Contacted: 06/10/19 Time [place]: [id number] Representative spoke with at [place]: cory   Social Determinants of Health ([place]) Interventions    Readmission Risk Interventions No flowsheet data found.

## 2019-07-04 NOTE — Progress Notes (Signed)
Woundvac removed per order.  Measurement of wound: 12cm long x 5cm wide x 4cm deep Wound cleansed with NS and NS moist kerlix packed into wound, covered with ABD pad.

## 2019-07-04 NOTE — TOC Progression Note (Signed)
Transition of Care [facility]) - Progression Note    Patient Details  Name: Taylor Tyler MRN: [id number] Date of Birth: 1941-05-14  Transition of Care [facility]) CM/SW [place], [place] Phone Number: 07/04/2019, 3:43 PM  Clinical Narrative:     [name]. SNF needs wound measurements and order wound vac  CSW called and cancelled PTAR.   CSW called and updated patient's son, Taylor Tyler, Therapist, sports and HD coordinator.   CSW will continue to follow and assist with discharge planning.  Thurmond Butts, MSW, [facility] Clinical Social Worker [phone]    Expected Discharge Plan: Skilled Nursing Facility Barriers to Discharge: Barriers Resolved  Expected Discharge Plan and Services Expected Discharge Plan: [place] In-house Referral: NA Discharge Planning Services: CM Consult Post Acute Care Choice: [place] Living arrangements for the past 2 months: Single Family Home Expected Discharge Date: 07/04/19               DME Arranged: (NA)         HH Arranged: RN, PT HH Agency: [place] Date [facility] Agency Contacted: 06/10/19 Time [place]: [id number] Representative spoke with at [place]: cory   Social Determinants of Health ([place]) Interventions    Readmission Risk Interventions No flowsheet data found.

## 2019-07-04 NOTE — Discharge Summary (Signed)
Physician Discharge Summary  Taylor Tyler FFM:[id number] DOB: 30-May-1941 DOA: 05/30/2019  PCP: McLean-Scocuzza, Nino Glow, MD  Admit date: 05/30/2019 Discharge date: 07/04/2019  Admitted From: Home  Disposition: SNF  Recommendations for Outpatient Follow-up:  1. Follow up with PCP in 1-2 weeks 2. Please obtain BMP/CBC in one week 3. Needs HD T, Thus, Saturday.  4. Needs IV antibiotics with HD.  5. Needs to follow up with Dr Kathyrn Sheriff post sx.    Discharge Condition; Stable.  CODE STATUS: full code Diet recommendation: Heart Healthy   Brief/Interim Summary: 78 yo with diabetes mellitus type 2 presently not on medication, chronic diastolic hypertension, chronic kidney disease stage III, anemia, hypothyroidism, hyperlipidemia.   She presented to the ED on 10/26 for dyspnea after a fall out of bed the night before and being on the floor for about 8 hrs. Her son found her in the AM. She was short of breath for at least 2 days.  In ED, noted to be wheezing. Suspected to have acute diastolic CHF and started on Lasix. Also noted to have AKI with Cr of5.74 and K of 5.3. Past Cr 2.64. As it was felt that her renal failure was permanent, she was eventually started on dialysis. It was planned to d/c her to SNF however, course complicated by leukocytosis and fevers on 11/11.  She was transferred to [facility] on 11/11 to a telemetry bed. She is receiving HD with nephyrology. Infectious disease has been consulted for antibiotics guidance for her sepsis due to infected back wound. Neurosurgery has also been consulted for management of this wound. She has undergone debridement of the lumbar wound with removal of hardware from fusion in 2018 which has never healed. Initially the patient was treated with IV Vancomycin and cefepime to be given with HD. However wound cultures have now grown out Proteus and she has been changed to cefazolin 2 gm to be continued through 08/04/2019. She is awaiting SNF  placement.  1-Acute on chronic diastolic heart failure exacerbation: Most recent echocardiogram 06/06/2019 demonstrated left ventricular ejection fraction 55 to 60%.  Grade 2 diastolic dysfunction.  Diuresis has not been effective in managing her volume overload.  Patient was a started on hemodialysis for volume control.  2-Acute on chronic kidney disease a stage IV: End-stage renal disease, metabolic acidosis.  Despite medical management, she did not diurese well and her creatinine continued to rise.  Nephrology has been consulted.  Patient underwent tunneled dialysis catheter placement and he started hemodialysis on 11//2020.  She was set up for outpatient hemodialysis for Tuesday, Thursday and Saturday.  3-Constipation: Started on docusate and senna.  4-Sepsis from infected back wound with fever and leukocytosis, metabolic acidosis.  Blood cultures x2 -.  Patient had a small draining wound on her lumbar spine related to lumbar fusion from 2018.  Neurosurgery has been consulted.  The patient underwent I and D on 06/16/2019.  Infectious disease has been consulted.  Patient was initially receiving IV vancomycin and cefepime.  Wound has grown Proteus.  She has been changed to IV cefazolin with hemodialysis to continue through 12/31st/2020. She will need to follow-up with Dr. Kathyrn Sheriff for further care of back wound. She currently has a wound VAC in place.  5-Acute toxic metabolic encephalopathy, underlying Dementia.  Family has noted memory loss for about 76 to 5 years.  She has had delusion and paranoia for about 76 years.  Much improved.  6-Hypothyroidism: Continue with Synthroid. 7-Hypertension: Continue with labetalol and hydralazine.  Hold Norvasc,  systolic blood pressure soft.  Hyperlipidemia: Continue with Lipitor.  Decubitus ulcer:    Pressure Injury 06/06/19 Sacrum Left Stage I - Intact skin with non-blanchable redness of a localized area usually over a bony prominence. (Active)   06/06/19 1010  Location: Sacrum  Location Orientation: Left  Staging: Stage I - Intact skin with non-blanchable redness of a localized area usually over a bony prominence.  Wound Description (Comments):   Present on Admission: No    Pressure Injury 06/12/19 Sacrum Left Stage II - Partial thickness loss of dermis presenting as a shallow open ulcer with a red, pink wound bed without slough. Pink (Active)  06/12/19 2115  Location: Sacrum  Location Orientation: Left  Staging: Stage II - Partial thickness loss of dermis presenting as a shallow open ulcer with a red, pink wound bed without slough.  Wound Description (Comments): Pink  Present on Admission:     Pressure Injury 06/15/19 Buttocks Left Stage II - Partial thickness loss of dermis presenting as a shallow open ulcer with a red, pink wound bed without slough. 100 % pinkk (Active)  06/15/19 1102  Location: Buttocks  Location Orientation: Left  Staging: Stage II - Partial thickness loss of dermis presenting as a shallow open ulcer with a red, pink wound bed without slough.  Wound Description (Comments): 100 % pinkk  Present on Admission: No       Discharge Diagnoses:  Principal Problem:   Acute CHF (congestive heart failure) (HCC) Active Problems:   Hypothyroidism   DM (diabetes mellitus), type 2 with renal complications (HCC)   Anemia   Acute renal failure superimposed on stage 3 chronic kidney disease (HCC)   Essential hypertension   Pressure injury of skin   Fever    Discharge Instructions  Discharge Instructions    Diet - low sodium heart healthy   Complete by: As directed    Increase activity slowly   Complete by: As directed      Allergies as of 07/04/2019      Reactions   Clams [shellfish Allergy] Swelling, Other (See Comments)   THROAT SWELLS NECK TURNS RED   Hydralazine Other (See Comments)   CHEST TIGHTNESS Patient has tolerated multiple doses of hydralazine since allergy was listed     Norvasc [amlodipine Besylate] Swelling   Leg edema    Milk-related Compounds Diarrhea      Medication List    STOP taking these medications   amLODipine 10 MG tablet Commonly known as: NORVASC   cloNIDine 0.1 MG tablet Commonly known as: CATAPRES   ferrous sulfate 325 (65 FE) MG tablet   furosemide 40 MG tablet Commonly known as: LASIX     TAKE these medications   acetaminophen 500 MG tablet Commonly known as: TYLENOL Take 500 mg by mouth 2 (two) times daily as needed for mild pain.   atorvastatin 40 MG tablet Commonly known as: LIPITOR Take 1 tablet (40 mg total) by mouth daily at 6 PM.   ceFAZolin 2-4 GM/100ML-% IVPB Commonly known as: ANCEF Inject 100 mLs (2 g total) into the vein every Tuesday, Thursday, and Saturday at 6 PM. Start taking on: July 05, 2019   gabapentin 100 MG capsule Commonly known as: NEURONTIN Take 2 capsules (200 mg total) by mouth at bedtime. What changed:   medication strength  how much to take   hydrALAZINE 50 MG tablet Commonly known as: APRESOLINE Take 1 tablet (50 mg total) by mouth every 8 (eight) hours.   labetalol 200 MG  tablet Commonly known as: NORMODYNE Take 1 tablet (200 mg total) by mouth 2 (two) times daily. What changed:   medication strength  how much to take   lactulose 10 GM/15ML solution Commonly known as: CHRONULAC Take 30 mLs (20 g total) by mouth daily. Start taking on: July 05, 2019   levalbuterol 1.25 MG/0.5ML nebulizer solution Commonly known as: XOPENEX Take 1.25 mg by nebulization every 6 (six) hours as needed for wheezing or shortness of breath.   levothyroxine 100 MCG tablet Commonly known as: SYNTHROID Take 100 mcg by mouth daily before breakfast.   sevelamer carbonate 800 MG tablet Commonly known as: RENVELA Take 1 tablet (800 mg total) by mouth 3 (three) times daily with meals.      Follow-up Information    [facility], [facility] Follow up.   Specialty: Home Health  Services Why: HHPT, [place] information: [place] [address] [phone]        Rosetta Posner, MD In 6 weeks.   Specialties: Vascular Surgery, Cardiology Why: Office will call you to arrange your appt (sent) Contact information: [place] [address] [phone]        Consuella Lose, MD Follow up in 1 week(s).   Specialty: Neurosurgery Contact information: [address] [place] [address] [phone]          Allergies  Allergen Reactions  . Clams [Shellfish Allergy] Swelling and Other (See Comments)    THROAT SWELLS NECK TURNS RED  . Hydralazine Other (See Comments)    CHEST TIGHTNESS Patient has tolerated multiple doses of hydralazine since allergy was listed   . Norvasc [Amlodipine Besylate] Swelling    Leg edema   . Milk-Related Compounds Diarrhea    Consultations:  nephrology   Procedures/Studies: Ct Lumbar Spine Wo Contrast  Result Date: 06/17/2019 CLINICAL DATA:  Follow-up lumbar fusion. EXAM: CT LUMBAR SPINE WITHOUT CONTRAST TECHNIQUE: Multidetector CT imaging of the lumbar spine was performed without intravenous contrast administration. Multiplanar CT image reconstructions were also generated. COMPARISON:  MRI 05/20/2019.  CT 02/24/2017. FINDINGS: Segmentation: 5 lumbar type vertebral bodies as numbered previously. Alignment: Normal Vertebrae: No regional fracture. Previous pedicle screws and posterior rods from L3 through S1. Discectomy and fusion material at L3-4, L4-5 and L5-S1. Anterior plate and screws at B5-Z0. Paraspinal and other soft tissues: There is fluid extending as better shown at MRI, posteriorly from the soft tissues posterior to the surgical region extending to the skin surface. This is not well characterized using ordinary CT. Disc levels: T11-12 and T12-L1: Normal. L1-2: Disc degeneration with vacuum phenomenon. Mild bulging of the disc. Mild facet osteoarthritis.  No compressive stenosis. L2-3: Disc degeneration with bulging of the disc. Bilateral facet hypertrophy. Stenosis of the lateral recesses and foramina, left more than right. Neural compression could occur at this level, more likely on the left. L3 to sacrum: There appears to be solid fusion across the disc space at L3-4. There is lucency around the L3 screws, which is surprising given the appearance of solid union. No loosening of the L4 screws. At L4-5, there is no sign of motion. I cannot definitely establish solid bone union across the disc space, but absence of signs of motion suggest union. At L5-S1, there is no screw loosening. There is solid union across the disc space. IMPRESSION: Satisfactory appearance in the fusion segment from L3 to the sacrum. Definite solid union at L3-4 and L5-S1. Lucency around the screws at L3  is difficult to explain given the appearance of solid union across the disc space in the absence of other signs of motion. At L4-5, there is no screw loosening and presumably there is solid union. Definite solid union at L5-S1. Satisfactory patency of the canal and foramina throughout that segment. Adjacent segment degenerative changes at L2-3 with bulging of the disc and facet and ligamentous hypertrophy resulting in narrowing of the lateral recesses and foramina left more than right that could be symptomatic. Electronically Signed   By: Nelson Chimes M.D.   On: 06/17/2019 14:28   Dg Fluoro Guide Cv Line-no Report  Result Date: 06/08/2019 Fluoroscopy was utilized by the requesting physician.  No radiographic interpretation.   Vas [place] Upper Ext Vein Mapping (pre-op Avf)  Result Date: 06/07/2019 UPPER EXTREMITY VEIN MAPPING  Indications: Pre-access. Comparison Study: No prior Performing Technologist: Sharion Dove RVS  Examination Guidelines: A complete evaluation includes B-mode imaging, spectral Doppler, color Doppler, and power Doppler as needed of all accessible portions of each  vessel. Bilateral testing is considered an integral part of a complete examination. Limited examinations for reoccurring indications may be performed as noted. +-----------------+-------------+----------+---------+ Right Cephalic   Diameter (cm)Depth (cm)Findings  +-----------------+-------------+----------+---------+ Prox upper arm       0.28        0.87             +-----------------+-------------+----------+---------+ Mid upper arm        0.27        0.43             +-----------------+-------------+----------+---------+ Dist upper arm       0.28        0.46   branching +-----------------+-------------+----------+---------+ Antecubital fossa    0.47        0.75   branching +-----------------+-------------+----------+---------+ Prox forearm         0.42        0.48             +-----------------+-------------+----------+---------+ Mid forearm          0.31        0.61   branching +-----------------+-------------+----------+---------+ Wrist                0.14        0.49             +-----------------+-------------+----------+---------+ +-----------------+-------------+----------+---------+ Left Cephalic    Diameter (cm)Depth (cm)Findings  +-----------------+-------------+----------+---------+ Prox upper arm       0.43        1.46             +-----------------+-------------+----------+---------+ Mid upper arm        0.40        1.00             +-----------------+-------------+----------+---------+ Dist upper arm       0.42        0.74             +-----------------+-------------+----------+---------+ Antecubital fossa    0.51        0.27             +-----------------+-------------+----------+---------+ Prox forearm         0.35        0.36   branching +-----------------+-------------+----------+---------+ Mid forearm          0.38        0.39   branching +-----------------+-------------+----------+---------+ Wrist                0.31  0.34             +-----------------+-------------+----------+---------+ *See table(s) above for measurements and observations.  Diagnosing physician: Ruta Hinds MD Electronically signed by Ruta Hinds MD on 06/07/2019 at 9:32:51 AM.    Final      Subjective: No new complaints, feels ok, wound vac in place.   Discharge Exam: Vitals:   07/04/19 1102 07/04/19 1335  BP: 131/65 137/66  Pulse: 64 67  Resp: 17 16  Temp: 97.9 F (36.6 C)   SpO2: 100% 100%     General: Pt is alert, awake, not in acute distress Cardiovascular: RRR, S1/S2 +, no rubs, no gallops Respiratory: CTA bilaterally, no wheezing, no rhonchi Abdominal: Soft, NT, ND, bowel sounds + Extremities: no edema, no cyanosis    The results of significant diagnostics from this hospitalization (including imaging, microbiology, ancillary and laboratory) are listed below for reference.     Microbiology: Recent Results (from the past 240 hour(s))  SARS CORONAVIRUS 2 (TAT 6-24 HRS) Nasopharyngeal Nasopharyngeal Swab     Status: None   Collection Time: 06/26/19  8:42 PM   Specimen: Nasopharyngeal Swab  Result Value Ref Range Status   SARS Coronavirus 2 NEGATIVE NEGATIVE Final    Comment: (NOTE) SARS-CoV-2 target nucleic acids are NOT DETECTED. The SARS-CoV-2 RNA is generally detectable in upper and lower respiratory specimens during the acute phase of infection. Negative results do not preclude SARS-CoV-2 infection, do not rule out co-infections with other pathogens, and should not be used as the sole basis for treatment or other patient management decisions. Negative results must be combined with clinical observations, patient history, and epidemiological information. The expected result is Negative. Fact Sheet for Patients: SugarRoll.be Fact Sheet for Healthcare Providers: https://www.woods-mathews.com/ This test is not yet approved or cleared by the [place]  FDA and  has been authorized for detection and/or diagnosis of SARS-CoV-2 by FDA under an Emergency Use Authorization (EUA). This EUA will remain  in effect (meaning this test can be used) for the duration of the COVID-19 declaration under Section 56 4(b)(1) of the Act, 21 U.S.C. section 360bbb-3(b)(1), unless the authorization is terminated or revoked sooner. Performed at [facility], [place] [address] [place], [place] [address]   SARS CORONAVIRUS 2 (TAT 6-24 HRS) Nasopharyngeal Nasopharyngeal Swab     Status: None   Collection Time: 07/01/19 12:12 PM   Specimen: Nasopharyngeal Swab  Result Value Ref Range Status   SARS Coronavirus 2 NEGATIVE NEGATIVE Final    Comment: (NOTE) SARS-CoV-2 target nucleic acids are NOT DETECTED. The SARS-CoV-2 RNA is generally detectable in upper and lower respiratory specimens during the acute phase of infection. Negative results do not preclude SARS-CoV-2 infection, do not rule out co-infections with other pathogens, and should not be used as the sole basis for treatment or other patient management decisions. Negative results must be combined with clinical observations, patient history, and epidemiological information. The expected result is Negative. Fact Sheet for Patients: SugarRoll.be Fact Sheet for Healthcare Providers: https://www.woods-mathews.com/ This test is not yet approved or cleared by the [place] FDA and  has been authorized for detection and/or diagnosis of SARS-CoV-2 by FDA under an Emergency Use Authorization (EUA). This EUA will remain  in effect (meaning this test can be used) for the duration of the COVID-19 declaration under Section 56 4(b)(1) of the Act, 21 U.S.C. section 360bbb-3(b)(1), unless the authorization is terminated or revoked sooner. Performed at [facility], [place] [address] [place],  [address]  Labs: BNP (last 3 results) Recent Labs     05/30/19 2056  BNP 338.2*   Basic Metabolic Panel: Recent Labs  Lab 06/30/19 0323 07/01/19 0544 07/02/19 0235 07/03/19 0335 07/04/19 0440  NA 135 137 135 136 132*  K 4.3 4.4 4.8 3.9 3.9  CL 97* 97* 98 95* 92*  CO2 22 24 23 24 24   GLUCOSE 89 98 90 94 82  BUN 38* 25* 39* 22 36*  CREATININE 6.92* 4.84* 6.58* 4.41* 6.39*  CALCIUM 8.5* 8.5* 8.3* 8.6* 8.5*  PHOS 5.0* 5.4* 6.1* 3.8 5.2*   Liver Function Tests: Recent Labs  Lab 06/30/19 0323 07/01/19 0544 07/02/19 0235 07/03/19 0335 07/04/19 0440  ALBUMIN 2.3* 2.2* 2.2* 2.3* 2.3*   No results for input(s): LIPASE, AMYLASE in the last 168 hours. No results for input(s): AMMONIA in the last 168 hours. CBC: Recent Labs  Lab 06/29/19 1250 07/02/19 0235 07/03/19 0335 07/04/19 0440  WBC 13.4* 11.7* 12.2* 8.2  NEUTROABS  --  9.1* 9.2* 5.7  HGB 9.4* 8.5* 8.8* 8.9*  HCT 31.8* 28.6* 29.6* 29.9*  MCV 89.8 89.7 90.8 91.2  PLT 125* 125* 124* 148*   Cardiac Enzymes: No results for input(s): CKTOTAL, CKMB, CKMBINDEX, TROPONINI in the last 168 hours. BNP: Invalid input(s): POCBNP CBG: Recent Labs  Lab 07/03/19 1246 07/03/19 1620 07/03/19 2102 07/04/19 0721 07/04/19 1114  GLUCAP 106* 79 84 75 97   D-Dimer No results for input(s): DDIMER in the last 72 hours. Hgb A1c No results for input(s): HGBA1C in the last 72 hours. Lipid Profile No results for input(s): CHOL, HDL, LDLCALC, TRIG, CHOLHDL, LDLDIRECT in the last 72 hours. Thyroid function studies No results for input(s): TSH, T4TOTAL, T3FREE, THYROIDAB in the last 72 hours.  Invalid input(s): FREET3 Anemia work up No results for input(s): VITAMINB12, FOLATE, FERRITIN, TIBC, IRON, RETICCTPCT in the last 72 hours. Urinalysis Sepsis Labs Invalid input(s): PROCALCITONIN,  WBC,  LACTICIDVEN Microbiology Recent Results (from the past 240 hour(s))  SARS CORONAVIRUS 2 (TAT 6-24 HRS) Nasopharyngeal Nasopharyngeal Swab     Status: None   Collection Time: 06/26/19  8:42  PM   Specimen: Nasopharyngeal Swab  Result Value Ref Range Status   SARS Coronavirus 2 NEGATIVE NEGATIVE Final    Comment: (NOTE) SARS-CoV-2 target nucleic acids are NOT DETECTED. The SARS-CoV-2 RNA is generally detectable in upper and lower respiratory specimens during the acute phase of infection. Negative results do not preclude SARS-CoV-2 infection, do not rule out co-infections with other pathogens, and should not be used as the sole basis for treatment or other patient management decisions. Negative results must be combined with clinical observations, patient history, and epidemiological information. The expected result is Negative. Fact Sheet for Patients: SugarRoll.be Fact Sheet for Healthcare Providers: https://www.woods-mathews.com/ This test is not yet approved or cleared by the [place] FDA and  has been authorized for detection and/or diagnosis of SARS-CoV-2 by FDA under an Emergency Use Authorization (EUA). This EUA will remain  in effect (meaning this test can be used) for the duration of the COVID-19 declaration under Section 56 4(b)(1) of the Act, 21 U.S.C. section 360bbb-3(b)(1), unless the authorization is terminated or revoked sooner. Performed at [facility], [place] [address], [place], [place] [address]   SARS CORONAVIRUS 2 (TAT 6-24 HRS) Nasopharyngeal Nasopharyngeal Swab     Status: None   Collection Time: 07/01/19 12:12 PM   Specimen: Nasopharyngeal Swab  Result Value Ref Range Status   SARS Coronavirus 2 NEGATIVE NEGATIVE Final  Comment: (NOTE) SARS-CoV-2 target nucleic acids are NOT DETECTED. The SARS-CoV-2 RNA is generally detectable in upper and lower respiratory specimens during the acute phase of infection. Negative results do not preclude SARS-CoV-2 infection, do not rule out co-infections with other pathogens, and should not be used as the sole basis for treatment or other patient management  decisions. Negative results must be combined with clinical observations, patient history, and epidemiological information. The expected result is Negative. Fact Sheet for Patients: SugarRoll.be Fact Sheet for Healthcare Providers: https://www.woods-mathews.com/ This test is not yet approved or cleared by the [place] FDA and  has been authorized for detection and/or diagnosis of SARS-CoV-2 by FDA under an Emergency Use Authorization (EUA). This EUA will remain  in effect (meaning this test can be used) for the duration of the COVID-19 declaration under Section 56 4(b)(1) of the Act, 21 U.S.C. section 360bbb-3(b)(1), unless the authorization is terminated or revoked sooner. Performed at [facility], [place] [address] [place], [place] [address]      Time coordinating discharge: 40 minutes  SIGNED:   Elmarie Shiley, MD  [facility]

## 2019-07-04 NOTE — TOC Transition Note (Signed)
Transition of Care [facility]) - CM/SW Discharge Note   Patient Details  Name: Taylor Tyler MRN: [id number] Date of Birth: Nov 07, 1940  Transition of Care [facility]) CM/SW Contact:  Vinie Sill, [place] Phone Number: 07/04/2019, 2:32 PM   Clinical Narrative:     Patient will DC to: [facility] Date: 07/04/2019 Family Notified: [name], son Transport BM:WUXL  RN, patient, and facility notified of DC. Discharge Summary sent to facility. RN given number for [phone]. Ambulance transport requested for patient.   Clinical Social Worker signing off. Thurmond Butts, MSW, [facility] Clinical Social Worker [phone]    Final next level of care: Skilled Nursing Facility Barriers to Discharge: Barriers Resolved   Patient Goals and CMS Choice Patient states their goals for this hospitalization and ongoing recovery are:: Pt son wants her to feel better CMS Medicare.gov Compare Post Acute Care list provided to:: Other (Comment Required)(Declined) Choice offered to / list presented to : NA  Discharge Placement PASRR number recieved: 06/13/19            Patient chooses bed at: [facility] Patient to be transferred to facility by: [place] Name of family member notified: [name], son Patient and family notified of of transfer: 07/04/19  Discharge Plan and Services In-house Referral: NA Discharge Planning Services: CM Consult Post Acute Care Choice: [place]          DME Arranged: (NA)         HH Arranged: RN, PT [facility] Agency: [place] Date [facility] Agency Contacted: 06/10/19 Time [place]: [id number] Representative spoke with at [place]: cory  Social Determinants of Health ([place]) Interventions     Readmission Risk Interventions No flowsheet data found.

## 2019-07-04 NOTE — Consult Note (Signed)
   [facility] Inpatient Consult   07/04/2019  Taylor Tyler 1940/10/11 [id number]   Follow up:  Disposition [name]  No current [name] Management needs at this time [employer] member].   For questions, please contact:  Natividad Brood, RN BSN [facility] Liaison  [phone] business mobile phone Toll free office [phone]  Fax number: [phone] [name]   Will sign off

## 2019-07-04 NOTE — Progress Notes (Signed)
[name], female   DOB: [birth year], 78 y.o.   MRN: [id number] S: No complaints and no events overnight. O:BP 137/66   Pulse 70   Temp 98.2 F (36.8 C) (Oral)   Resp 19   Ht 5\' 3"  (1.6 m)   Wt 96 kg   SpO2 100%   BMI 37.49 kg/m   Intake/Output Summary (Last 24 hours) at 07/04/2019 1553 Last data filed at 07/04/2019 1300 Gross per 24 hour  Intake 960 ml  Output 75 ml  Net 885 ml   Intake/Output: I/O last 3 completed shifts: In: 1080 [P.O.:1080] Out: 175 [Drains:175]  Intake/Output this shift:  Total I/O In: 240 [P.O.:240] Out: 0  Weight change:  Gen: NAD CVS: no rub Resp: cta Abd: +BS, soft, NT/ND Ext: no edema, LUE AVF +T/B  Recent Labs  Lab 06/28/19 0315 06/29/19 1250 06/30/19 0323 07/01/19 0544 07/02/19 0235 07/03/19 0335 07/04/19 0440  NA 135 134* 135 137 135 136 132*  K 3.3* 4.4 4.3 4.4 4.8 3.9 3.9  CL 97* 97* 97* 97* 98 95* 92*  CO2 23 21* 22 24 23 24 24   GLUCOSE 101* 105* 89 98 90 94 82  BUN 24* 36* 38* 25* 39* 22 36*  CREATININE 3.92* 6.16* 6.92* 4.84* 6.58* 4.41* 6.39*  ALBUMIN  --  2.5* 2.3* 2.2* 2.2* 2.3* 2.3*  CALCIUM 8.2* 8.5* 8.5* 8.5* 8.3* 8.6* 8.5*  PHOS  --  4.8* 5.0* 5.4* 6.1* 3.8 5.2*   Liver Function Tests: Recent Labs  Lab 07/02/19 0235 07/03/19 0335 07/04/19 0440  ALBUMIN 2.2* 2.3* 2.3*   No results for input(s): LIPASE, AMYLASE in the last 168 hours. No results for input(s): AMMONIA in the last 168 hours. CBC: Recent Labs  Lab 06/29/19 1250 07/02/19 0235 07/03/19 0335 07/04/19 0440  WBC 13.4* 11.7* 12.2* 8.2  NEUTROABS  --  9.1* 9.2* 5.7  HGB 9.4* 8.5* 8.8* 8.9*  HCT 31.8* 28.6* 29.6* 29.9*  MCV 89.8 89.7 90.8 91.2  PLT 125* 125* 124* 148*   Cardiac Enzymes: No results for input(s): CKTOTAL, CKMB, CKMBINDEX, TROPONINI in the last 168 hours. CBG: Recent Labs  Lab 07/03/19 1246 07/03/19 1620 07/03/19 2102 07/04/19 0721 07/04/19 1114  GLUCAP 106* 79 84 75 97    Iron Studies: No results for  input(s): IRON, TIBC, TRANSFERRIN, FERRITIN in the last 72 hours. Studies/Results: No results found. Marland Kitchen amLODipine  5 mg Oral Daily  . atorvastatin  40 mg Oral q1800  . Chlorhexidine Gluconate Cloth  6 each Topical Q0600  . darbepoetin (ARANESP) injection - DIALYSIS  150 mcg Intravenous Q Sat-HD  . docusate sodium  100 mg Oral BID  . gabapentin  200 mg Oral QHS  . hydrALAZINE  50 mg Oral Q8H  . labetalol  200 mg Oral BID  . lactulose  20 g Oral Daily  . levothyroxine  100 mcg Oral QAC breakfast  . sevelamer carbonate  800 mg Oral TID WC    BMET    Component Value Date/Time   NA 132 (L) 07/04/2019 0440   NA 140 04/10/2017   NA 140 04/22/2014 0210   K 3.9 07/04/2019 0440   K 4.0 04/22/2014 0210   CL 92 (L) 07/04/2019 0440   CL 108 (H) 04/22/2014 0210   CO2 24 07/04/2019 0440   CO2 24 04/22/2014 0210   GLUCOSE 82 07/04/2019 0440   GLUCOSE 78 04/22/2014 0210   BUN 36 (H) 07/04/2019 0440   BUN 27 (A) 04/10/2017  BUN 27 (H) 04/22/2014 0210   CREATININE 6.39 (H) 07/04/2019 0440   CREATININE 2.64 (H) 01/12/2019 1352   CALCIUM 8.5 (L) 07/04/2019 0440   CALCIUM 8.5 04/22/2014 0210   GFRNONAA 6 (L) 07/04/2019 0440   GFRNONAA 51 (L) 04/22/2014 0210   GFRAA 7 (L) 07/04/2019 0440   GFRAA 59 (L) 04/22/2014 0210   CBC    Component Value Date/Time   WBC 8.2 07/04/2019 0440   RBC 3.28 (L) 07/04/2019 0440   HGB 8.9 (L) 07/04/2019 0440   HGB 9.6 (L) 04/22/2014 0210   HCT 29.9 (L) 07/04/2019 0440   HCT 29.7 (L) 04/22/2014 0210   PLT 148 (L) 07/04/2019 0440   PLT 230 04/22/2014 0210   MCV 91.2 07/04/2019 0440   MCV 90 04/22/2014 0210   MCH 27.1 07/04/2019 0440   MCHC 29.8 (L) 07/04/2019 0440   RDW 18.4 (H) 07/04/2019 0440   RDW 17.8 (H) 04/22/2014 0210   LYMPHSABS 1.5 07/04/2019 0440   LYMPHSABS 2.0 04/22/2014 0210   MONOABS 0.6 07/04/2019 0440   MONOABS 0.6 04/22/2014 0210   EOSABS 0.2 07/04/2019 0440   EOSABS 0.0 04/22/2014 0210   BASOSABS 0.1 07/04/2019 0440   BASOSABS  0.0 04/22/2014 0210     Assessment/Plan:  1. New ESRD- currently doing well on TTS schedule and is set up for outpatient HD at [facility] tomorrow. 2. Sepsis from infected back wound- she is to stay on ancef through 08/05/2019.  Wound vac in place and will need wound care as an outpatient. 3. Anemia of ESRD- Aranesp 150 mcg IV weekly 4. SHPTH- on renvela 5. HTN- amlodipine decreased to 5 mg and lower hydralazine.  Do not take bp meds before HD sessions. 6. Disposition- she was accepted at [facility] and will be discharged today.  Donetta Potts, MD [employer] [phone]

## 2019-07-04 NOTE — Care Management Important Message (Signed)
Important Message  Patient Details  Name: Taylor Tyler MRN: [id number] Date of Birth: 03/17/1941   Medicare Important Message Given:  Yes     Memory Argue 07/04/2019, 1:20 PM

## 2019-07-04 NOTE — Consult Note (Signed)
WOC Nurse Consult Note: Asked to measure neck wound prior to discharge for wound VAC.  [place] team was not consulted when washout was done last and VAC applied.  Surgery has been managing this wound. Spoke with bedside RN who agrees to remove wound vac and measure wound and implement twice daily NS gauze dressing prior to leaving.   Reason for Consult: Chronic nonhealing neck wound.  Wound type:dehisced surgical wound. Washout 06/23/19 and wound Vac applied and managed by surgical team.  Pressure Injury POA: NO Measurement:to be obtained by bedside RN  Wound bed:VAC In place Drainage (amount, consistency, odor) serosanguinous in canister Periwound:not assessed.  Dressing procedure/placement/frequency:Remove NPWT dressing and cleanse with NS.  ApPly NS moist gauze dressing to neck wound.  Pack depth with NS moist kerlix, leaving a tail to retrieve packing. Cover with ABD pad .  Change twice daily.  TO discharge to SNF where wound will be assessed there and VAC re-applied.  Will not follow at this time.  Please re-consult if needed.  Domenic Moras MSN, RN, FNP-BC CWON Wound, Ostomy, Continence Nurse Pager [phone]

## 2019-07-05 LAB — RENAL FUNCTION PANEL
Albumin: 2.2 g/dL — ABNORMAL LOW (ref 3.5–5.0)
Albumin: 2.2 g/dL — ABNORMAL LOW (ref 3.5–5.0)
Anion gap: 19 — ABNORMAL HIGH (ref 5–15)
Anion gap: 15 (ref 5–15)
BUN: 46 mg/dL — ABNORMAL HIGH (ref 8–23)
BUN: 47 mg/dL — ABNORMAL HIGH (ref 8–23)
CO2: 22 mmol/L (ref 22–32)
CO2: 24 mmol/L (ref 22–32)
Calcium: 8.7 mg/dL — ABNORMAL LOW (ref 8.9–10.3)
Calcium: 8.5 mg/dL — ABNORMAL LOW (ref 8.9–10.3)
Chloride: 93 mmol/L — ABNORMAL LOW (ref 98–111)
Chloride: 93 mmol/L — ABNORMAL LOW (ref 98–111)
Creatinine, Ser: 7.89 mg/dL — ABNORMAL HIGH (ref 0.44–1.00)
Creatinine, Ser: 8.09 mg/dL — ABNORMAL HIGH (ref 0.44–1.00)
GFR calc Af Amer: 5 mL/min — ABNORMAL LOW (ref >60)
GFR calc Af Amer: 5 mL/min — ABNORMAL LOW (ref >60)
GFR calc non Af Amer: 5 mL/min — ABNORMAL LOW (ref >60)
GFR calc non Af Amer: 4 mL/min — ABNORMAL LOW (ref >60)
Glucose, Bld: 77 mg/dL (ref 70–99)
Glucose, Bld: 99 mg/dL (ref 70–99)
Phosphorus: 5.1 mg/dL — ABNORMAL HIGH (ref 2.5–4.6)
Phosphorus: 5.4 mg/dL — ABNORMAL HIGH (ref 2.5–4.6)
Potassium: 4.4 mmol/L (ref 3.5–5.1)
Potassium: 4.4 mmol/L (ref 3.5–5.1)
Sodium: 134 mmol/L — ABNORMAL LOW (ref 135–145)
Sodium: 132 mmol/L — ABNORMAL LOW (ref 135–145)

## 2019-07-05 LAB — CBC
HCT: 29.2 % — ABNORMAL LOW (ref 36.0–46.0)
Hemoglobin: 8.7 g/dL — ABNORMAL LOW (ref 12.0–15.0)
MCH: 27.4 pg (ref 26.0–34.0)
MCHC: 29.8 g/dL — ABNORMAL LOW (ref 30.0–36.0)
MCV: 92.1 fL (ref 80.0–100.0)
Platelets: 174 10*3/uL (ref 150–400)
RBC: 3.17 MIL/uL — ABNORMAL LOW (ref 3.87–5.11)
RDW: 18.7 % — ABNORMAL HIGH (ref 11.5–15.5)
WBC: 8.5 10*3/uL (ref 4.0–10.5)
nRBC: 0.2 % (ref 0.0–0.2)

## 2019-07-05 LAB — GLUCOSE, CAPILLARY
Glucose-Capillary: 78 mg/dL (ref 70–99)
Glucose-Capillary: 86 mg/dL (ref 70–99)
Glucose-Capillary: 144 mg/dL — ABNORMAL HIGH (ref 70–99)
Glucose-Capillary: 79 mg/dL (ref 70–99)

## 2019-07-05 LAB — SARS CORONAVIRUS 2 (TAT 6-24 HRS): SARS Coronavirus 2: NEGATIVE

## 2019-07-05 MED ORDER — HEPARIN SODIUM (PORCINE) 1000 UNIT/ML IJ SOLN
INTRAMUSCULAR | Status: AC
  Administered 2019-07-05: 13:00:00
  Filled 2019-07-05: qty 4

## 2019-07-05 NOTE — TOC Progression Note (Signed)
Transition of Care [facility]) - Progression Note    Patient Details  Name: Taylor Tyler MRN: [id number] Date of Birth: 12-13-1940  Transition of Care [facility]) CM/SW [place], [place] Phone Number: 07/05/2019, 11:23 AM  Clinical Narrative:     CSW informed by Neoma Laming, Admission Director at [facility], patient's insurance called this morning and requested reauthorization. SNF faxed requested clinicals.   CSW updated RN and requested another Covid test as required by SNF.  CSW will continue to follow and assist with discharge planning.   Thurmond Butts, MSW, [facility] Clinical Social Worker [phone]   Expected Discharge Plan: Skilled Nursing Facility Barriers to Discharge: Barriers Resolved  Expected Discharge Plan and Services Expected Discharge Plan: [place] In-house Referral: NA Discharge Planning Services: CM Consult Post Acute Care Choice: [place] Living arrangements for the past 2 months: Single Family Home Expected Discharge Date: 07/04/19               DME Arranged: (NA)         HH Arranged: RN, PT HH Agency: [place] Date [facility] Agency Contacted: 06/10/19 Time [place]: 1607 Representative spoke with at [place]: cory   Social Determinants of Health ([place]) Interventions    Readmission Risk Interventions No flowsheet data found.

## 2019-07-05 NOTE — Progress Notes (Signed)
Patient's discharge was delayed yesterday due to unknown/unmeasured wound/wound vac.  Wound on neck after wound vac removed" Measurement: 12cm long x 5cm wide x 4cm deep Wound cleansed with NS and NS moist kerlix packed into wound, covered with ABD pad- will need this changed 2x/day -TO discharge to SNF where wound will be assessed there and VAC re-applied.   Today will get routine HD Will need another insurance authorization and a STAT COVID 19 test has been ordered Please see d/c summary from 11/30 by Dr. Tyrell Antonio.  Eulogio Bear DO

## 2019-07-05 NOTE — Progress Notes (Signed)
PT Cancellation Note  Patient Details Name: ALEZANDRA EGLI MRN: [id number] DOB: Nov 12, 1940   Cancelled Treatment:    Reason Eval/Treat Not Completed: Fatigue/lethargy limiting ability to participate  Patient recently returned from HD and adamantly refusing to do anything at this time. Will attempt to see later today as schedule permits.    Barry Brunner, PT Pager [phone]   Rexanne Mano 07/05/2019, 4:06 PM

## 2019-07-05 NOTE — Procedures (Signed)
I was present at this dialysis session. I have reviewed the session itself and made appropriate changes.   Vital signs in last 24 hours:  Temp:  [97.9 F (36.6 C)-98.6 F (37 C)] 97.9 F (36.6 C) (12/01 0747) Pulse Rate:  [62-70] 62 (12/01 0747) Resp:  [14-20] 16 (12/01 0747) BP: (114-137)/(44-66) 125/51 (12/01 0747) SpO2:  [95 %-100 %] 99 % (12/01 0747) Weight:  [97 kg] 97 kg (12/01 0454) Weight change:  Filed Weights   07/02/19 0405 07/03/19 0500 07/05/19 0454  Weight: 99 kg 96 kg 97 kg    Recent Labs  Lab 07/05/19 0458  NA 134*  K 4.4  CL 93*  CO2 22  GLUCOSE 77  BUN 46*  CREATININE 7.89*  CALCIUM 8.7*  PHOS 5.1*    Recent Labs  Lab 07/02/19 0235 07/03/19 0335 07/04/19 0440  WBC 11.7* 12.2* 8.2  NEUTROABS 9.1* 9.2* 5.7  HGB 8.5* 8.8* 8.9*  HCT 28.6* 29.6* 29.9*  MCV 89.7 90.8 91.2  PLT 125* 124* 148*    Scheduled Meds: . amLODipine  5 mg Oral Daily  . atorvastatin  40 mg Oral q1800  . Chlorhexidine Gluconate Cloth  6 each Topical Q0600  . darbepoetin (ARANESP) injection - DIALYSIS  150 mcg Intravenous Q Sat-HD  . docusate sodium  100 mg Oral BID  . gabapentin  200 mg Oral QHS  . hydrALAZINE  50 mg Oral Q8H  . labetalol  200 mg Oral BID  . lactulose  20 g Oral Daily  . levothyroxine  100 mcg Oral QAC breakfast  . sevelamer carbonate  800 mg Oral TID WC   Continuous Infusions: . sodium chloride 10 mL/hr at 06/18/19 1746  . sodium chloride 10 mL/hr at 06/29/19 1231  .  ceFAZolin (ANCEF) IV 2 g (07/02/19 1555)   PRN Meds:.acetaminophen **OR** acetaminophen, bisacodyl, hydrALAZINE, HYDROcodone-acetaminophen, ipratropium, levalbuterol, ondansetron **OR** ondansetron (ZOFRAN) IV, polyethylene glycol    Assessment/Plan:  1. New ESRD- currently doing well on TTS schedule and is set up for outpatient HD at [facility] 07/07/19. 2. Sepsis from infected back wound- she is to stay on ancef through 08/05/2019.  Wound vac removed 07/04/19 and will need wound  care as an outpatient.  F/u with neurosurgery in 3 weeks. 3. Anemia of ESRD- Aranesp 150 mcg IV weekly 4. SHPTH- on renvela 5. HTN- amlodipine decreased to 5 mg and lower hydralazine.  Do not take bp meds before HD sessions. 6. Nonhealing back wound- wound vac removed and dressings applied by woc team.  To be re-eavaluated at SNF. 7. Disposition- she was accepted at [facility] and will be discharged today after HD  Taylor Potts,  MD 07/05/2019, 8:16 AM

## 2019-07-06 LAB — RENAL FUNCTION PANEL
Albumin: 2.4 g/dL — ABNORMAL LOW (ref 3.5–5.0)
Anion gap: 14 (ref 5–15)
BUN: 17 mg/dL (ref 8–23)
CO2: 28 mmol/L (ref 22–32)
Calcium: 9 mg/dL (ref 8.9–10.3)
Chloride: 95 mmol/L — ABNORMAL LOW (ref 98–111)
Creatinine, Ser: 4.66 mg/dL — ABNORMAL HIGH (ref 0.44–1.00)
GFR calc Af Amer: 10 mL/min — ABNORMAL LOW (ref >60)
GFR calc non Af Amer: 9 mL/min — ABNORMAL LOW (ref >60)
Glucose, Bld: 115 mg/dL — ABNORMAL HIGH (ref 70–99)
Phosphorus: 4.3 mg/dL (ref 2.5–4.6)
Potassium: 3.6 mmol/L (ref 3.5–5.1)
Sodium: 137 mmol/L (ref 135–145)

## 2019-07-06 LAB — GLUCOSE, CAPILLARY
Glucose-Capillary: 101 mg/dL — ABNORMAL HIGH (ref 70–99)
Glucose-Capillary: 86 mg/dL (ref 70–99)
Glucose-Capillary: 72 mg/dL (ref 70–99)
Glucose-Capillary: 80 mg/dL (ref 70–99)

## 2019-07-06 LAB — SURGICAL PATHOLOGY

## 2019-07-06 NOTE — TOC Progression Note (Signed)
Transition of Care [facility]) - Progression Note    Patient Details  Name: TUWANNA KRAUSZ MRN: [id number] Date of Birth: 09/20/1940  Transition of Care [facility]) CM/SW [place], [place] Phone Number: 07/06/2019, 2:58 PM  Clinical Narrative:     CSW called [place] to inquire about insurance auth- CSW was informed authorization remains pending.  Thurmond Butts, MSW, [facility] Clinical Social Worker [phone]   Expected Discharge Plan: Skilled Nursing Facility Barriers to Discharge: Barriers Resolved  Expected Discharge Plan and Services Expected Discharge Plan: [place] In-house Referral: NA Discharge Planning Services: CM Consult Post Acute Care Choice: [place] Living arrangements for the past 2 months: Single Family Home Expected Discharge Date: 07/06/19               DME Arranged: (NA)         HH Arranged: RN, PT HH Agency: [place] Date [facility] Agency Contacted: 06/10/19 Time [place]: 9169 Representative spoke with at [place]: cory   Social Determinants of Health ([place]) Interventions    Readmission Risk Interventions No flowsheet data found.

## 2019-07-06 NOTE — Progress Notes (Signed)
[name], female   DOB: April 14, 1941, 78 y.o.   MRN: [id number] S: Discharge was delayed due to pending covid test.  It is negative and should be discharged today. O:BP (!) 126/51 (BP Location: Right Arm)   Pulse 68   Temp 98.2 F (36.8 C) (Oral)   Resp 18   Ht 5\' 3"  (1.6 m)   Wt 97 kg   SpO2 92%   BMI 37.88 kg/m   Intake/Output Summary (Last 24 hours) at 07/06/2019 1342 Last data filed at 07/05/2019 1500 Gross per 24 hour  Intake 200 ml  Output -  Net 200 ml   Intake/Output: I/O last 3 completed shifts: In: 200 [P.O.:200] Out: 2000 [Other:2000]  Intake/Output this shift:  No intake/output data recorded. Weight change: 0.4 kg Gen: NAD CVS: no rub Resp: cta Abd: +BS, soft, NT/ND Ext: no edema, LUE AVF +T/B  Recent Labs  Lab 07/01/19 0544 07/02/19 0235 07/03/19 0335 07/04/19 0440 07/05/19 0458 07/05/19 0828 07/06/19 0632  NA 137 135 136 132* 134* 132* 137  K 4.4 4.8 3.9 3.9 4.4 4.4 3.6  CL 97* 98 95* 92* 93* 93* 95*  CO2 24 23 24 24 22 24 28   GLUCOSE 98 90 94 82 77 99 115*  BUN 25* 39* 22 36* 46* 47* 17  CREATININE 4.84* 6.58* 4.41* 6.39* 7.89* 8.09* 4.66*  ALBUMIN 2.2* 2.2* 2.3* 2.3* 2.2* 2.2* 2.4*  CALCIUM 8.5* 8.3* 8.6* 8.5* 8.7* 8.5* 9.0  PHOS 5.4* 6.1* 3.8 5.2* 5.1* 5.4* 4.3   Liver Function Tests: Recent Labs  Lab 07/05/19 0458 07/05/19 0828 07/06/19 0632  ALBUMIN 2.2* 2.2* 2.4*   No results for input(s): LIPASE, AMYLASE in the last 168 hours. No results for input(s): AMMONIA in the last 168 hours. CBC: Recent Labs  Lab 07/02/19 0235 07/03/19 0335 07/04/19 0440 07/05/19 0829  WBC 11.7* 12.2* 8.2 8.5  NEUTROABS 9.1* 9.2* 5.7  --   HGB 8.5* 8.8* 8.9* 8.7*  HCT 28.6* 29.6* 29.9* 29.2*  MCV 89.7 90.8 91.2 92.1  PLT 125* 124* 148* 174   Cardiac Enzymes: No results for input(s): CKTOTAL, CKMB, CKMBINDEX, TROPONINI in the last 168 hours. CBG: Recent Labs  Lab 07/05/19 1253 07/05/19 1643 07/05/19 2120 07/06/19 0737  07/06/19 1110  GLUCAP 86 144* 79 101* 86    Iron Studies: No results for input(s): IRON, TIBC, TRANSFERRIN, FERRITIN in the last 72 hours. Studies/Results: No results found. Marland Kitchen amLODipine  5 mg Oral Daily  . atorvastatin  40 mg Oral q1800  . Chlorhexidine Gluconate Cloth  6 each Topical Q0600  . darbepoetin (ARANESP) injection - DIALYSIS  150 mcg Intravenous Q Sat-HD  . docusate sodium  100 mg Oral BID  . gabapentin  200 mg Oral QHS  . hydrALAZINE  50 mg Oral Q8H  . labetalol  200 mg Oral BID  . lactulose  20 g Oral Daily  . levothyroxine  100 mcg Oral QAC breakfast  . sevelamer carbonate  800 mg Oral TID WC    BMET    Component Value Date/Time   NA 137 07/06/2019 0632   NA 140 04/10/2017   NA 140 04/22/2014 0210   K 3.6 07/06/2019 0632   K 4.0 04/22/2014 0210   CL 95 (L) 07/06/2019 0632   CL 108 (H) 04/22/2014 0210   CO2 28 07/06/2019 0632   CO2 24 04/22/2014 0210   GLUCOSE 115 (H) 07/06/2019 0632   GLUCOSE 78 04/22/2014 0210   BUN 17 07/06/2019 0034  BUN 27 (A) 04/10/2017   BUN 27 (H) 04/22/2014 0210   CREATININE 4.66 (H) 07/06/2019 0632   CREATININE 2.64 (H) 01/12/2019 1352   CALCIUM 9.0 07/06/2019 0632   CALCIUM 8.5 04/22/2014 0210   GFRNONAA 9 (L) 07/06/2019 0632   GFRNONAA 51 (L) 04/22/2014 0210   GFRAA 10 (L) 07/06/2019 0632   GFRAA 59 (L) 04/22/2014 0210   CBC    Component Value Date/Time   WBC 8.5 07/05/2019 0829   RBC 3.17 (L) 07/05/2019 0829   HGB 8.7 (L) 07/05/2019 0829   HGB 9.6 (L) 04/22/2014 0210   HCT 29.2 (L) 07/05/2019 0829   HCT 29.7 (L) 04/22/2014 0210   PLT 174 07/05/2019 0829   PLT 230 04/22/2014 0210   MCV 92.1 07/05/2019 0829   MCV 90 04/22/2014 0210   MCH 27.4 07/05/2019 0829   MCHC 29.8 (L) 07/05/2019 0829   RDW 18.7 (H) 07/05/2019 0829   RDW 17.8 (H) 04/22/2014 0210   LYMPHSABS 1.5 07/04/2019 0440   LYMPHSABS 2.0 04/22/2014 0210   MONOABS 0.6 07/04/2019 0440   MONOABS 0.6 04/22/2014 0210   EOSABS 0.2 07/04/2019 0440    EOSABS 0.0 04/22/2014 0210   BASOSABS 0.1 07/04/2019 0440   BASOSABS 0.0 04/22/2014 0210    Assessment/Plan:  1. New ESRD- currently doing well on TTS schedule and is set up for outpatient HD at [facility] 07/07/19. 2. Sepsis from infected back wound- she is to stay on ancef through 08/05/2019. Wound vac removed 07/04/19 and will need wound care as an outpatient.  F/u with neurosurgery in 3 weeks. 3. Anemia of ESRD- Aranesp 150 mcg IV weekly 4. SHPTH- on renvela 5. HTN- amlodipine decreased to 5 mg and lower hydralazine. Do not take bp meds before HD sessions. 6. Nonhealing back wound- wound vac removed and dressings applied by woc team.  To be re-eavaluated at SNF. 7. Vascular access- has RIJ TDC and LUE AVF placed 06/08/19 by Dr. Donnetta Hutching 8. Disposition- she was accepted at [facility] and will be discharged today after HD  Donetta Potts, MD [facility] [phone]

## 2019-07-06 NOTE — Progress Notes (Signed)
Seen and examined at bedside, no complaints.  Awaiting placement at this time.  Covid test is negative for placement.  Skilled nursing facility required further wound information which was documented by yesterday's provider.  Awaiting authorization.  Discharge summary-11/30.  Med rec: Completed.  Vital signs are stable.   Gerlean Ren MD

## 2019-07-06 NOTE — Progress Notes (Signed)
Physical Therapy Treatment Patient Details Name: Taylor Tyler MRN: [id number] DOB: 06-Oct-1940 Today's Date: 07/06/2019    History of Present Illness Pt is a 78 y.o. female admitted 05/30/19 after fall at home, laying on the floor at least 8 hours until found by son, also with worsening SOB and LE edema. VQ scan with low probability for PE. Worked up for HF, AKI. Course complicated by leukocytosis and fevers on 11/11. Pt with chronic lumbar wound infection/dehiscense with fusion from L3-S1; now s/p hardward removal on 11/19 with wound VAC placement. Other PMH includes CKD III, DM2, DJD s/p lumbar sx (2018), arthritis.    PT Comments    Pt required min assist bed mobility and mod assist transfers. Pt fatigues quickly. Pt declined sitting in recliner. Returned to bed at end of session. Current POC remains appropriate.    Follow Up Recommendations  SNF     Equipment Recommendations  None recommended by PT    Recommendations for Other Services       Precautions / Restrictions Precautions Precautions: Fall    Mobility  Bed Mobility Overal bed mobility: Needs Assistance Bed Mobility: Sidelying to Sit;Sit to Sidelying   Sidelying to sit: Min assist;HOB elevated Supine to sit: Min assist   Sit to sidelying: HOB elevated General bed mobility comments: +rail, cues for sequencing, assist to elevate trunk  Transfers Overall transfer level: Needs assistance Equipment used: Rolling walker (2 wheeled) Transfers: Sit to/from [facility] Sit to Stand: Mod assist Stand pivot transfers: Mod assist       General transfer comment: cues for hand placement, assist to power up. Pivot steps with RW bed<>BSC  Ambulation/Gait                 Stairs             Wheelchair Mobility    Modified Rankin (Stroke Patients Only)       Balance Overall balance assessment: Needs assistance Sitting-balance support: Feet supported;No upper extremity  supported Sitting balance-Leahy Scale: Fair     Standing balance support: During functional activity;Bilateral upper extremity supported Standing balance-Leahy Scale: Poor Standing balance comment: reliant on external support                            Cognition Arousal/Alertness: Awake/alert Behavior During Therapy: Flat affect Overall Cognitive Status: Impaired/Different from baseline Area of Impairment: Memory;Safety/judgement;Problem solving;Orientation;Attention;Following commands;Awareness                 Orientation Level: Disoriented to;Place;Time Current Attention Level: Sustained Memory: Decreased short-term memory;Decreased recall of precautions Following Commands: Follows one step commands with increased time Safety/Judgement: Decreased awareness of safety;Decreased awareness of deficits Awareness: Intellectual Problem Solving: Slow processing;Decreased initiation;Difficulty sequencing;Requires verbal cues        Exercises      General Comments        Pertinent Vitals/Pain Pain Assessment: Faces Faces Pain Scale: Hurts little more Pain Location: lower back Pain Descriptors / Indicators: Grimacing;Sore Pain Intervention(s): Monitored during session;Repositioned    Home Living                      Prior Function            PT Goals (current goals can now be found in the care plan section) Acute Rehab PT Goals Patient Stated Goal: not stated Progress towards PT goals: Progressing toward goals    Frequency    Min  2X/week      PT Plan Current plan remains appropriate    Co-evaluation              AM-PAC PT "6 Clicks" Mobility   Outcome Measure  Help needed turning from your back to your side while in a flat bed without using bedrails?: A Little Help needed moving from lying on your back to sitting on the side of a flat bed without using bedrails?: A Little Help needed moving to and from a bed to a chair  (including a wheelchair)?: A Lot Help needed standing up from a chair using your arms (e.g., wheelchair or bedside chair)?: A Lot Help needed to walk in hospital room?: A Lot Help needed climbing 3-5 steps with a railing? : Total 6 Click Score: 13    End of Session Equipment Utilized During Treatment: Gait belt Activity Tolerance: Patient limited by fatigue Patient left: in bed;with call bell/phone within reach Nurse Communication: Mobility status PT Visit Diagnosis: Muscle weakness (generalized) (M62.81)     Time: 1610-9604 PT Time Calculation (min) (ACUTE ONLY): 14 min  Charges:  $Therapeutic Activity: 8-22 mins                     Lorrin Goodell, PT  Office # [phone] Pager [phone]    Lorriane Shire 07/06/2019, 1:26 PM

## 2019-07-07 LAB — CBC
HCT: 29.4 % — ABNORMAL LOW (ref 36.0–46.0)
Hemoglobin: 8.8 g/dL — ABNORMAL LOW (ref 12.0–15.0)
MCH: 27.7 pg (ref 26.0–34.0)
MCHC: 29.9 g/dL — ABNORMAL LOW (ref 30.0–36.0)
MCV: 92.5 fL (ref 80.0–100.0)
Platelets: 145 10*3/uL — ABNORMAL LOW (ref 150–400)
RBC: 3.18 MIL/uL — ABNORMAL LOW (ref 3.87–5.11)
RDW: 18.9 % — ABNORMAL HIGH (ref 11.5–15.5)
WBC: 9.4 10*3/uL (ref 4.0–10.5)
nRBC: 0.2 % (ref 0.0–0.2)

## 2019-07-07 LAB — RENAL FUNCTION PANEL
Albumin: 2.5 g/dL — ABNORMAL LOW (ref 3.5–5.0)
Anion gap: 14 (ref 5–15)
BUN: 27 mg/dL — ABNORMAL HIGH (ref 8–23)
CO2: 25 mmol/L (ref 22–32)
Calcium: 8.9 mg/dL (ref 8.9–10.3)
Chloride: 92 mmol/L — ABNORMAL LOW (ref 98–111)
Creatinine, Ser: 6.58 mg/dL — ABNORMAL HIGH (ref 0.44–1.00)
GFR calc Af Amer: 6 mL/min — ABNORMAL LOW (ref >60)
GFR calc non Af Amer: 6 mL/min — ABNORMAL LOW (ref >60)
Glucose, Bld: 80 mg/dL (ref 70–99)
Phosphorus: 4.7 mg/dL — ABNORMAL HIGH (ref 2.5–4.6)
Potassium: 4.4 mmol/L (ref 3.5–5.1)
Sodium: 131 mmol/L — ABNORMAL LOW (ref 135–145)

## 2019-07-07 LAB — GLUCOSE, CAPILLARY
Glucose-Capillary: 77 mg/dL (ref 70–99)
Glucose-Capillary: 87 mg/dL (ref 70–99)
Glucose-Capillary: 88 mg/dL (ref 70–99)

## 2019-07-07 MED ORDER — HEPARIN SODIUM (PORCINE) 1000 UNIT/ML IJ SOLN
INTRAMUSCULAR | Status: AC
  Administered 2019-07-07: 3400 [IU]
  Filled 2019-07-07: qty 4

## 2019-07-07 NOTE — Procedures (Signed)
I was present at this dialysis session. I have reviewed the session itself and made appropriate changes.   Vital signs in last 24 hours:  Temp:  [97.5 F (36.4 C)-98.8 F (37.1 C)] 97.7 F (36.5 C) (12/03 1415) Pulse Rate:  [64-74] 69 (12/03 1500) Resp:  [13-18] 16 (12/03 1415) BP: (87-144)/(47-70) 98/49 (12/03 1500) SpO2:  [96 %-100 %] 99 % (12/03 1415) Weight:  [97.2 kg] 97.2 kg (12/03 1415) Weight change: -0.2 kg Filed Weights   07/06/19 0500 07/07/19 0500 07/07/19 1415  Weight: 97 kg 97.2 kg 97.2 kg    Recent Labs  Lab 07/07/19 0816  NA 131*  K 4.4  CL 92*  CO2 25  GLUCOSE 80  BUN 27*  CREATININE 6.58*  CALCIUM 8.9  PHOS 4.7*    Recent Labs  Lab 07/02/19 0235 07/03/19 0335 07/04/19 0440 07/05/19 0829 07/07/19 1424  WBC 11.7* 12.2* 8.2 8.5 9.4  NEUTROABS 9.1* 9.2* 5.7  --   --   HGB 8.5* 8.8* 8.9* 8.7* 8.8*  HCT 28.6* 29.6* 29.9* 29.2* 29.4*  MCV 89.7 90.8 91.2 92.1 92.5  PLT 125* 124* 148* 174 145*    Scheduled Meds: . amLODipine  5 mg Oral Daily  . atorvastatin  40 mg Oral q1800  . Chlorhexidine Gluconate Cloth  6 each Topical Q0600  . darbepoetin (ARANESP) injection - DIALYSIS  150 mcg Intravenous Q Sat-HD  . docusate sodium  100 mg Oral BID  . gabapentin  200 mg Oral QHS  . hydrALAZINE  50 mg Oral Q8H  . labetalol  200 mg Oral BID  . lactulose  20 g Oral Daily  . levothyroxine  100 mcg Oral QAC breakfast  . sevelamer carbonate  800 mg Oral TID WC   Continuous Infusions: . sodium chloride 10 mL/hr at 06/18/19 1746  . sodium chloride 10 mL/hr at 06/29/19 1231  .  ceFAZolin (ANCEF) IV Stopped (07/05/19 1251)   PRN Meds:.acetaminophen **OR** acetaminophen, bisacodyl, hydrALAZINE, HYDROcodone-acetaminophen, ipratropium, levalbuterol, ondansetron **OR** ondansetron (ZOFRAN) IV, polyethylene glycol    Assessment/Plan:  1. New ESRD- currently doing well on TTS schedule and is set up for outpatient HD at [facility]12/5/20. 2. Sepsis from  infected back wound- she is to stay on ancef through 08/05/2019. Wound vacremoved 07/04/19 and will need wound care as an outpatient.F/u with neurosurgery in 3 weeks. 3. Anemia of ESRD- Aranesp 150 mcg IV weekly 4. SHPTH- on renvela 5. HTN- amlodipine decreased to 5 mg and lower hydralazine. Do not take bp meds before HD sessions. 6. Nonhealing back wound- wound vac removed and dressings applied by woc team. To be re-eavaluated at SNF. 7. Vascular access- has RIJ TDC and LUE AVF placed 06/08/19 by Dr. Donnetta Hutching 8. Disposition- she was accepted at [facility] and hopefully will be discharged todayafter HD  Donetta Potts,  MD 07/07/2019, 3:19 PM

## 2019-07-07 NOTE — TOC Progression Note (Signed)
Transition of Care [facility]) - Progression Note    Patient Details  Name: DELMI FULFER MRN: [id number] Date of Birth: 12/18/1940  Transition of Care [facility]) CM/SW [place], [place] Phone Number: 07/07/2019, 1:44 PM  Clinical Narrative:     CSW spoke with the patient's son, [name] and advised the patient remains in the hospital , we are waiting  on insurance authorization.  Thurmond Butts, MSW, [facility] Clinical Social Worker [phone]   Expected Discharge Plan: Skilled Nursing Facility Barriers to Discharge: Barriers Resolved  Expected Discharge Plan and Services Expected Discharge Plan: [place] In-house Referral: NA Discharge Planning Services: CM Consult Post Acute Care Choice: [place] Living arrangements for the past 2 months: Single Family Home Expected Discharge Date: 07/07/19               DME Arranged: (NA)         HH Arranged: RN, PT HH Agency: [place] Date [facility] Agency Contacted: 06/10/19 Time [place]: [id number] Representative spoke with at [place]: cory   Social Determinants of Health ([place]) Interventions    Readmission Risk Interventions No flowsheet data found.

## 2019-07-07 NOTE — Progress Notes (Signed)
OT Cancellation Note  Patient Details Name: Taylor Tyler MRN: [id number] DOB: 10-Apr-1941   Cancelled Treatment:    Reason Eval/Treat Not Completed: Fatigue/lethargy limiting ability to participate;Other (comment) Pt asleep upon arrival. Pt easily aroused but reports that she does not want to get OOB today. Pt reports painful constipation and would rather not move right now. Will check back as time allows.  Lanier Clam., COTA/L Acute Rehabilitation Services [phone] [place] 07/07/2019, 1:45 PM

## 2019-07-07 NOTE — TOC Progression Note (Signed)
Transition of Care [facility]) - Progression Note    Patient Details  Name: Taylor Tyler MRN: [id number] Date of Birth: 11/28/1940  Transition of Care [facility]) CM/SW [place], [place] Phone Number: 07/07/2019, 10:18 AM  Clinical Narrative:     CSW patient's insurance still pending- Per [facility] it is under review.  CSW has updated leadership.  Thurmond Butts, MSW, [facility] Clinical Social Worker [phone]   Expected Discharge Plan: Skilled Nursing Facility Barriers to Discharge: Barriers Resolved  Expected Discharge Plan and Services Expected Discharge Plan: [place] In-house Referral: NA Discharge Planning Services: CM Consult Post Acute Care Choice: [place] Living arrangements for the past 2 months: Single Family Home Expected Discharge Date: 07/06/19               DME Arranged: (NA)         HH Arranged: RN, PT HH Agency: [place] Date [facility] Agency Contacted: 06/10/19 Time [place]: [phone] Representative spoke with at [place]: cory   Social Determinants of Health ([place]) Interventions    Readmission Risk Interventions No flowsheet data found.

## 2019-07-07 NOTE — TOC Progression Note (Signed)
Transition of Care [facility]) - Progression Note    Patient Details  Name: ROXSANA RIDING MRN: [id number] Date of Birth: 1941-07-11  Transition of Care [facility]) CM/SW [place], [place] Phone Number: 07/07/2019, 4:54 PM  Clinical Narrative:     Patient's insurance authorization remains pending.   CSW has informed leadership of insurance authorization delay.  Thurmond Butts, MSW, [facility] Clinical Social Worker [phone]   Expected Discharge Plan: Skilled Nursing Facility Barriers to Discharge: Barriers Resolved  Expected Discharge Plan and Services Expected Discharge Plan: [place] In-house Referral: NA Discharge Planning Services: CM Consult Post Acute Care Choice: [place] Living arrangements for the past 2 months: Single Family Home Expected Discharge Date: 07/07/19               DME Arranged: (NA)         HH Arranged: RN, PT HH Agency: [place] Date [facility] Agency Contacted: 06/10/19 Time [facility] [employer] [facility]: [id number] Representative spoke with at [place]: cory   Social Determinants of Health ([place]) Interventions    Readmission Risk Interventions No flowsheet data found.

## 2019-07-07 NOTE — Discharge Summary (Signed)
Discharge Summary  Taylor Tyler UTM:[id number] DOB: [birth year]  PCP: McLean-Scocuzza, Nino Glow, MD  Admit date: 05/30/2019 Discharge date: 07/07/2019  Time spent: 25 minutes  Recommendations for Outpatient Follow-up:  1. Follow up with PCP in 1-2 weeks 2. Please obtain BMP/CBC in one week 3. Needs HD T, Thus, Saturday.  4. Needs IV antibiotics with HD.  5. Needs to follow up with Dr Kathyrn Sheriff post sx.  Discharge Diagnoses:  Active Hospital Problems   Diagnosis Date Noted  . Acute CHF (congestive heart failure) ([place]) 05/30/2019  . Fever   . Pressure injury of skin 06/08/2019  . Essential hypertension   . Acute renal failure superimposed on stage 3 chronic kidney disease ([place]) 03/14/2017  . Anemia 03/14/2017  . DM (diabetes mellitus), type 2 with renal complications ([place]) 56/81/2751  . Hypothyroidism 01/15/2017    Resolved Hospital Problems  No resolved problems to display.    Vitals:   07/07/19 0805 07/07/19 1105  BP: 128/68 (!) 116/53  Pulse: 68 64  Resp: 16 14  Temp: 97.8 F (36.6 C) 98 F (36.7 C)  SpO2: 97% 100%    Discharge Condition; Stable.  CODE STATUS: full code Diet recommendation: Heart Healthy   Brief/Interim Summary: 78 yo with diabetes mellitus type 2 presently not on medication, chronic diastolic hypertension, chronic kidney disease stage III, anemia, hypothyroidism, hyperlipidemia.   She presented to the ED on 10/26 for dyspnea after a fall out of bed the night before and being on the floor for about 8 hrs. Her son found her in the AM. She was short of breath for at least 2 days.  In ED, noted to be wheezing. Suspected to have acute diastolic CHF and started on Lasix. Also noted to have AKI with Cr of5.74 and K of 5.3. Past Cr 2.64. As it was felt that her renal failure was permanent, she was eventually started on dialysis. It was planned to d/c her to SNF however, course complicated by leukocytosis and fevers on 11/11.  She was transferred  to [facility] on 11/11 to a telemetry bed. She is receiving HD with nephyrology. Infectious disease has been consulted for antibiotics guidance for her sepsis due to infected back wound. Neurosurgery has also been consulted for management of this wound. She has undergone debridement of the lumbar wound with removal of hardware from fusion in 2018 which has never healed. Initially the patient was treated with IV Vancomycin and cefepime to be given with HD. However wound cultures have now grown out Proteus and she has been changed to cefazolin 2 gm to be continued through 08/04/2019. She is awaiting SNF placement.  1-Acute on chronic diastolic heart failure exacerbation: Most recent echocardiogram 06/06/2019 demonstrated left ventricular ejection fraction 55 to 60%.  Grade 2 diastolic dysfunction.  Diuresis has not been effective in managing her volume overload.  Patient was a started on hemodialysis for volume control.  2-new ESRD with acute on chronic kidney disease a stage IV: End-stage renal disease, metabolic acidosis.  Despite medical management, she did not diurese well and her creatinine continued to rise.    Seen by nephrology.  Patient underwent tunneled dialysis catheter placement and he started hemodialysis on 11//2020.  She is set up for outpatient hemodialysis for Tuesday, Thursday and Saturday.  3-Constipation: Started on docusate and senna.  4-Sepsis from infected back wound with fever and leukocytosis, metabolic acidosis.  Blood cultures x2 -.  Patient had a small draining wound on her lumbar spine related to lumbar fusion  from 2018.  Neurosurgery has been consulted.  The patient underwent I and D on 06/16/2019.  Infectious disease has been consulted.  Patient was initially receiving IV vancomycin and cefepime.  Wound has grown Proteus.  She has been changed to IV cefazolin with hemodialysis to continue through 12/31st/2020. She will need to follow-up with Dr. Kathyrn Sheriff for further care of back  wound. She currently has a wound VAC in place.  5-Acute toxic metabolic encephalopathy, underlying Dementia.  Family has noted memory loss for about 4 to 5 years.  She has had delusion and paranoia for about 2 years.  Much improved.  6-Hypothyroidism: Continue with Synthroid. 7-Hypertension: Continue with labetalol and hydralazine.  Hold Norvasc, systolic blood pressure soft.  Hyperlipidemia: Continue with Lipitor.  Physical debility: PT OT assessment recommended SNF.  CSW assisting with placement.  Continue physical therapy with assistance and fall precautions.  Decubitus ulcer:    Pressure Injury 06/06/19 Sacrum Left Stage I - Intact skin with non-blanchable redness of a localized area usually over a bony prominence. (Active)  06/06/19 1010  Location: Sacrum  Location Orientation: Left  Staging: Stage I - Intact skin with non-blanchable redness of a localized area usually over a bony prominence.  Wound Description (Comments):   Present on Admission: No    Pressure Injury 06/12/19 Sacrum Left Stage II - Partial thickness loss of dermis presenting as a shallow open ulcer with a red, pink wound bed without slough. Pink (Active)  06/12/19 2115  Location: Sacrum  Location Orientation: Left  Staging: Stage II - Partial thickness loss of dermis presenting as a shallow open ulcer with a red, pink wound bed without slough.  Wound Description (Comments): Pink  Present on Admission:     Pressure Injury 06/15/19 Buttocks Left Stage II - Partial thickness loss of dermis presenting as a shallow open ulcer with a red, pink wound bed without slough. 100 % pinkk (Active)  06/15/19 1102  Location: Buttocks  Location Orientation: Left  Staging: Stage II - Partial thickness loss of dermis presenting as a shallow open ulcer with a red, pink wound bed without slough.  Wound Description (Comments): 100 % pinkk  Present on Admission: No         Hospital Course:  Principal  Problem:   Acute CHF (congestive heart failure) ([facility]) Active Problems:   Hypothyroidism   DM (diabetes mellitus), type 2 with renal complications ([facility])   Anemia   Acute renal failure superimposed on stage 3 chronic kidney disease ([facility])   Essential hypertension   Pressure injury of skin   Fever   Consultations:  Nephrology  Discharge Exam: BP (!) 116/53 (BP Location: Right Arm)   Pulse 64   Temp 98 F (36.7 C) (Oral)   Resp 14   Ht 5\' 3"  (1.6 m)   Wt 97.2 kg   SpO2 100%   BMI 37.96 kg/m  . General: 78 y.o. year-old female well developed well nourished in no acute distress.  Alert and interactive. . Cardiovascular: Regular rate and rhythm with no rubs or gallops.  No thyromegaly or JVD noted.   Marland Kitchen Respiratory: Clear to auscultation with no wheezes or rales. Good inspiratory effort. . Abdomen: Soft nontender nondistended with normal bowel sounds x4 quadrants. . Musculoskeletal: Trace lower extremity edema. 2/4 pulses in all 4 extremities. Marland Kitchen Psychiatry: Mood is appropriate for condition and setting  Discharge Instructions You were cared for by a hospitalist during your hospital stay. If you have any questions about your discharge medications  or the care you received while you were in the hospital after you are discharged, you can call the unit and asked to speak with the hospitalist on call if the hospitalist that took care of you is not available. Once you are discharged, your primary care physician will handle any further medical issues. Please note that NO REFILLS for any discharge medications will be authorized once you are discharged, as it is imperative that you return to your primary care physician (or establish a relationship with a primary care physician if you do not have one) for your aftercare needs so that they can reassess your need for medications and monitor your lab values.  Discharge Instructions    Diet - low sodium heart healthy   Complete by: As directed     Increase activity slowly   Complete by: As directed      Allergies as of 07/07/2019      Reactions   Clams [shellfish Allergy] Swelling, Other (See Comments)   THROAT SWELLS NECK TURNS RED   Hydralazine Other (See Comments)   CHEST TIGHTNESS Patient has tolerated multiple doses of hydralazine since allergy was listed    Norvasc [amlodipine Besylate] Swelling   Leg edema    Milk-related Compounds Diarrhea      Medication List    STOP taking these medications   amLODipine 10 MG tablet Commonly known as: NORVASC   cloNIDine 0.1 MG tablet Commonly known as: CATAPRES   ferrous sulfate 325 (65 FE) MG tablet   furosemide 40 MG tablet Commonly known as: LASIX     TAKE these medications   acetaminophen 500 MG tablet Commonly known as: TYLENOL Take 500 mg by mouth 2 (two) times daily as needed for mild pain.   atorvastatin 40 MG tablet Commonly known as: LIPITOR Take 1 tablet (40 mg total) by mouth daily at 6 PM.   ceFAZolin 2-4 GM/100ML-% IVPB Commonly known as: ANCEF Inject 100 mLs (2 g total) into the vein every Tuesday, Thursday, and Saturday at 6 PM.   gabapentin 100 MG capsule Commonly known as: NEURONTIN Take 2 capsules (200 mg total) by mouth at bedtime. What changed:   medication strength  how much to take   hydrALAZINE 50 MG tablet Commonly known as: APRESOLINE Take 1 tablet (50 mg total) by mouth every 8 (eight) hours.   labetalol 200 MG tablet Commonly known as: NORMODYNE Take 1 tablet (200 mg total) by mouth 2 (two) times daily. What changed:   medication strength  how much to take   lactulose 10 GM/15ML solution Commonly known as: CHRONULAC Take 30 mLs (20 g total) by mouth daily.   levalbuterol 1.25 MG/0.5ML nebulizer solution Commonly known as: XOPENEX Take 1.25 mg by nebulization every 6 (six) hours as needed for wheezing or shortness of breath.   levothyroxine 100 MCG tablet Commonly known as: SYNTHROID Take 100 mcg by mouth daily  before breakfast.   sevelamer carbonate 800 MG tablet Commonly known as: RENVELA Take 1 tablet (800 mg total) by mouth 3 (three) times daily with meals.      Allergies  Allergen Reactions  . Clams [Shellfish Allergy] Swelling and Other (See Comments)    THROAT SWELLS NECK TURNS RED  . Hydralazine Other (See Comments)    CHEST TIGHTNESS Patient has tolerated multiple doses of hydralazine since allergy was listed   . Norvasc [Amlodipine Besylate] Swelling    Leg edema   . Milk-Related Compounds Diarrhea   Follow-up Information    [facility],  [facility] Follow up.   Specialty: Home Health Services Why: HHPT, [place] information: [place] [address] [phone]        Rosetta Posner, MD In 6 weeks.   Specialties: Vascular Surgery, Cardiology Why: Office will call you to arrange your appt (sent) Contact information: [place] [address] [phone]        Consuella Lose, MD Follow up in 1 week(s).   Specialty: Neurosurgery Contact information: [address] [place] [address] [phone]            The results of significant diagnostics from this hospitalization (including imaging, microbiology, ancillary and laboratory) are listed below for reference.    Significant Diagnostic Studies: Ct Lumbar Spine Wo Contrast  Result Date: 06/17/2019 CLINICAL DATA:  Follow-up lumbar fusion. EXAM: CT LUMBAR SPINE WITHOUT CONTRAST TECHNIQUE: Multidetector CT imaging of the lumbar spine was performed without intravenous contrast administration. Multiplanar CT image reconstructions were also generated. COMPARISON:  MRI 05/20/2019.  CT 02/24/2017. FINDINGS: Segmentation: 5 lumbar type vertebral bodies as numbered previously. Alignment: Normal Vertebrae: No regional fracture. Previous pedicle screws and posterior rods from L3 through S1. Discectomy and fusion material at L3-4, L4-5 and L5-S1. Anterior  plate and screws at Y7-C6. Paraspinal and other soft tissues: There is fluid extending as better shown at MRI, posteriorly from the soft tissues posterior to the surgical region extending to the skin surface. This is not well characterized using ordinary CT. Disc levels: T11-12 and T12-L1: Normal. L1-2: Disc degeneration with vacuum phenomenon. Mild bulging of the disc. Mild facet osteoarthritis. No compressive stenosis. L2-3: Disc degeneration with bulging of the disc. Bilateral facet hypertrophy. Stenosis of the lateral recesses and foramina, left more than right. Neural compression could occur at this level, more likely on the left. L3 to sacrum: There appears to be solid fusion across the disc space at L3-4. There is lucency around the L3 screws, which is surprising given the appearance of solid union. No loosening of the L4 screws. At L4-5, there is no sign of motion. I cannot definitely establish solid bone union across the disc space, but absence of signs of motion suggest union. At L5-S1, there is no screw loosening. There is solid union across the disc space. IMPRESSION: Satisfactory appearance in the fusion segment from L3 to the sacrum. Definite solid union at L3-4 and L5-S1. Lucency around the screws at L3 is difficult to explain given the appearance of solid union across the disc space in the absence of other signs of motion. At L4-5, there is no screw loosening and presumably there is solid union. Definite solid union at L5-S1. Satisfactory patency of the canal and foramina throughout that segment. Adjacent segment degenerative changes at L2-3 with bulging of the disc and facet and ligamentous hypertrophy resulting in narrowing of the lateral recesses and foramina left more than right that could be symptomatic. Electronically Signed   By: Nelson Chimes M.D.   On: 06/17/2019 14:28   Dg Fluoro Guide Cv Line-no Report  Result Date: 06/08/2019 Fluoroscopy was utilized by the requesting physician.  No  radiographic interpretation.    Microbiology: Recent Results (from the past 240 hour(s))  SARS CORONAVIRUS 2 (TAT 6-24 HRS) Nasopharyngeal Nasopharyngeal Swab     Status: None   Collection Time: 07/01/19 12:12 PM   Specimen: Nasopharyngeal Swab  Result Value Ref Range Status   SARS Coronavirus 2 NEGATIVE NEGATIVE Final    Comment: (NOTE) SARS-CoV-2  target nucleic acids are NOT DETECTED. The SARS-CoV-2 RNA is generally detectable in upper and lower respiratory specimens during the acute phase of infection. Negative results do not preclude SARS-CoV-2 infection, do not rule out co-infections with other pathogens, and should not be used as the sole basis for treatment or other patient management decisions. Negative results must be combined with clinical observations, patient history, and epidemiological information. The expected result is Negative. Fact Sheet for Patients: SugarRoll.be Fact Sheet for Healthcare Providers: https://www.woods-mathews.com/ This test is not yet approved or cleared by the [place] FDA and  has been authorized for detection and/or diagnosis of SARS-CoV-2 by FDA under an Emergency Use Authorization (EUA). This EUA will remain  in effect (meaning this test can be used) for the duration of the COVID-19 declaration under Section 56 4(b)(1) of the Act, 21 U.S.C. section 360bbb-3(b)(1), unless the authorization is terminated or revoked sooner. Performed at [facility], [place] [address], [place], [place] [address]   SARS CORONAVIRUS 2 (TAT 6-24 HRS) Nasopharyngeal Nasopharyngeal Swab     Status: None   Collection Time: 07/05/19 12:32 PM   Specimen: Nasopharyngeal Swab  Result Value Ref Range Status   SARS Coronavirus 2 NEGATIVE NEGATIVE Final    Comment: (NOTE) SARS-CoV-2 target nucleic acids are NOT DETECTED. The SARS-CoV-2 RNA is generally detectable in upper and lower respiratory specimens during the acute  phase of infection. Negative results do not preclude SARS-CoV-2 infection, do not rule out co-infections with other pathogens, and should not be used as the sole basis for treatment or other patient management decisions. Negative results must be combined with clinical observations, patient history, and epidemiological information. The expected result is Negative. Fact Sheet for Patients: SugarRoll.be Fact Sheet for Healthcare Providers: https://www.woods-mathews.com/ This test is not yet approved or cleared by the [place] FDA and  has been authorized for detection and/or diagnosis of SARS-CoV-2 by FDA under an Emergency Use Authorization (EUA). This EUA will remain  in effect (meaning this test can be used) for the duration of the COVID-19 declaration under Section 56 4(b)(1) of the Act, 21 U.S.C. section 360bbb-3(b)(1), unless the authorization is terminated or revoked sooner. Performed at [facility], [place] [address], [place], [place] [address]      Labs: Basic Metabolic Panel: Recent Labs  Lab 07/04/19 0440 07/05/19 0458 07/05/19 0828 07/06/19 0632 07/07/19 0816  NA 132* 134* 132* 137 131*  K 3.9 4.4 4.4 3.6 4.4  CL 92* 93* 93* 95* 92*  CO2 24 22 24 28 25   GLUCOSE 82 77 99 115* 80  BUN 36* 46* 47* 17 27*  CREATININE 6.39* 7.89* 8.09* 4.66* 6.58*  CALCIUM 8.5* 8.7* 8.5* 9.0 8.9  PHOS 5.2* 5.1* 5.4* 4.3 4.7*   Liver Function Tests: Recent Labs  Lab 07/04/19 0440 07/05/19 0458 07/05/19 0828 07/06/19 0632 07/07/19 0816  ALBUMIN 2.3* 2.2* 2.2* 2.4* 2.5*   No results for input(s): LIPASE, AMYLASE in the last 168 hours. No results for input(s): AMMONIA in the last 168 hours. CBC: Recent Labs  Lab 07/02/19 0235 07/03/19 0335 07/04/19 0440 07/05/19 0829  WBC 11.7* 12.2* 8.2 8.5  NEUTROABS 9.1* 9.2* 5.7  --   HGB 8.5* 8.8* 8.9* 8.7*  HCT 28.6* 29.6* 29.9* 29.2*  MCV 89.7 90.8 91.2 92.1  PLT 125* 124* 148*  174   Cardiac Enzymes: No results for input(s): CKTOTAL, CKMB, CKMBINDEX, TROPONINI in the last 168 hours. BNP: BNP (last 3 results) Recent Labs  05/30/19 2056  BNP 851.4*    ProBNP (last 3 results) No results for input(s): PROBNP in the last 8760 hours.  CBG: Recent Labs  Lab 07/06/19 1110 07/06/19 1605 07/06/19 2125 07/07/19 0811 07/07/19 1107  GLUCAP 86 72 80 77 87       Signed:  Kayleen Memos, MD [facility] 07/07/2019, 11:59 AM

## 2019-07-08 DIAGNOSIS — R110 Nausea: Secondary | ICD-10-CM | POA: Diagnosis not present

## 2019-07-08 DIAGNOSIS — I130 Hypertensive heart and chronic kidney disease with heart failure and stage 1 through stage 4 chronic kidney disease, or unspecified chronic kidney disease: Secondary | ICD-10-CM | POA: Diagnosis not present

## 2019-07-08 DIAGNOSIS — L8990 Pressure ulcer of unspecified site, unspecified stage: Secondary | ICD-10-CM | POA: Diagnosis not present

## 2019-07-08 DIAGNOSIS — A4189 Other specified sepsis: Secondary | ICD-10-CM | POA: Diagnosis not present

## 2019-07-08 DIAGNOSIS — J811 Chronic pulmonary edema: Secondary | ICD-10-CM | POA: Diagnosis not present

## 2019-07-08 DIAGNOSIS — I10 Essential (primary) hypertension: Secondary | ICD-10-CM | POA: Diagnosis not present

## 2019-07-08 DIAGNOSIS — Z7401 Bed confinement status: Secondary | ICD-10-CM | POA: Diagnosis not present

## 2019-07-08 DIAGNOSIS — D631 Anemia in chronic kidney disease: Secondary | ICD-10-CM | POA: Diagnosis not present

## 2019-07-08 DIAGNOSIS — N184 Chronic kidney disease, stage 4 (severe): Secondary | ICD-10-CM | POA: Diagnosis not present

## 2019-07-08 DIAGNOSIS — R509 Fever, unspecified: Secondary | ICD-10-CM | POA: Diagnosis not present

## 2019-07-08 DIAGNOSIS — D519 Vitamin B12 deficiency anemia, unspecified: Secondary | ICD-10-CM | POA: Diagnosis not present

## 2019-07-08 DIAGNOSIS — N186 End stage renal disease: Secondary | ICD-10-CM | POA: Diagnosis not present

## 2019-07-08 DIAGNOSIS — E785 Hyperlipidemia, unspecified: Secondary | ICD-10-CM | POA: Diagnosis not present

## 2019-07-08 DIAGNOSIS — R279 Unspecified lack of coordination: Secondary | ICD-10-CM | POA: Diagnosis not present

## 2019-07-08 DIAGNOSIS — Z23 Encounter for immunization: Secondary | ICD-10-CM | POA: Diagnosis not present

## 2019-07-08 DIAGNOSIS — I959 Hypotension, unspecified: Secondary | ICD-10-CM | POA: Diagnosis not present

## 2019-07-08 DIAGNOSIS — R05 Cough: Secondary | ICD-10-CM | POA: Diagnosis not present

## 2019-07-08 DIAGNOSIS — R2681 Unsteadiness on feet: Secondary | ICD-10-CM | POA: Diagnosis not present

## 2019-07-08 DIAGNOSIS — N183 Chronic kidney disease, stage 3 unspecified: Secondary | ICD-10-CM | POA: Diagnosis not present

## 2019-07-08 DIAGNOSIS — R531 Weakness: Secondary | ICD-10-CM | POA: Diagnosis not present

## 2019-07-08 DIAGNOSIS — R5381 Other malaise: Secondary | ICD-10-CM | POA: Diagnosis not present

## 2019-07-08 DIAGNOSIS — D689 Coagulation defect, unspecified: Secondary | ICD-10-CM | POA: Diagnosis not present

## 2019-07-08 DIAGNOSIS — R519 Headache, unspecified: Secondary | ICD-10-CM | POA: Diagnosis not present

## 2019-07-08 DIAGNOSIS — M2550 Pain in unspecified joint: Secondary | ICD-10-CM | POA: Diagnosis not present

## 2019-07-08 DIAGNOSIS — G3184 Mild cognitive impairment, so stated: Secondary | ICD-10-CM | POA: Diagnosis not present

## 2019-07-08 DIAGNOSIS — I509 Heart failure, unspecified: Secondary | ICD-10-CM | POA: Diagnosis not present

## 2019-07-08 DIAGNOSIS — D649 Anemia, unspecified: Secondary | ICD-10-CM | POA: Diagnosis not present

## 2019-07-08 DIAGNOSIS — R0602 Shortness of breath: Secondary | ICD-10-CM | POA: Diagnosis not present

## 2019-07-08 DIAGNOSIS — T8463XD Infection and inflammatory reaction due to internal fixation device of spine, subsequent encounter: Secondary | ICD-10-CM | POA: Diagnosis not present

## 2019-07-08 DIAGNOSIS — D509 Iron deficiency anemia, unspecified: Secondary | ICD-10-CM | POA: Diagnosis not present

## 2019-07-08 DIAGNOSIS — G301 Alzheimer's disease with late onset: Secondary | ICD-10-CM | POA: Diagnosis not present

## 2019-07-08 DIAGNOSIS — R0902 Hypoxemia: Secondary | ICD-10-CM | POA: Diagnosis not present

## 2019-07-08 DIAGNOSIS — I129 Hypertensive chronic kidney disease with stage 1 through stage 4 chronic kidney disease, or unspecified chronic kidney disease: Secondary | ICD-10-CM | POA: Diagnosis not present

## 2019-07-08 DIAGNOSIS — A499 Bacterial infection, unspecified: Secondary | ICD-10-CM | POA: Diagnosis not present

## 2019-07-08 DIAGNOSIS — M6281 Muscle weakness (generalized): Secondary | ICD-10-CM | POA: Diagnosis not present

## 2019-07-08 DIAGNOSIS — I5033 Acute on chronic diastolic (congestive) heart failure: Secondary | ICD-10-CM | POA: Diagnosis not present

## 2019-07-08 DIAGNOSIS — R52 Pain, unspecified: Secondary | ICD-10-CM | POA: Diagnosis not present

## 2019-07-08 DIAGNOSIS — I5031 Acute diastolic (congestive) heart failure: Secondary | ICD-10-CM | POA: Diagnosis not present

## 2019-07-08 DIAGNOSIS — Z743 Need for continuous supervision: Secondary | ICD-10-CM | POA: Diagnosis not present

## 2019-07-08 DIAGNOSIS — Z79899 Other long term (current) drug therapy: Secondary | ICD-10-CM | POA: Diagnosis not present

## 2019-07-08 DIAGNOSIS — Z03818 Encounter for observation for suspected exposure to other biological agents ruled out: Secondary | ICD-10-CM | POA: Diagnosis not present

## 2019-07-08 DIAGNOSIS — E1122 Type 2 diabetes mellitus with diabetic chronic kidney disease: Secondary | ICD-10-CM | POA: Diagnosis not present

## 2019-07-08 DIAGNOSIS — U071 COVID-19: Secondary | ICD-10-CM | POA: Diagnosis not present

## 2019-07-08 DIAGNOSIS — E039 Hypothyroidism, unspecified: Secondary | ICD-10-CM | POA: Diagnosis not present

## 2019-07-08 DIAGNOSIS — N179 Acute kidney failure, unspecified: Secondary | ICD-10-CM | POA: Diagnosis not present

## 2019-07-08 DIAGNOSIS — A419 Sepsis, unspecified organism: Secondary | ICD-10-CM | POA: Diagnosis not present

## 2019-07-08 DIAGNOSIS — I5032 Chronic diastolic (congestive) heart failure: Secondary | ICD-10-CM | POA: Diagnosis not present

## 2019-07-08 DIAGNOSIS — Z992 Dependence on renal dialysis: Secondary | ICD-10-CM | POA: Diagnosis not present

## 2019-07-08 LAB — RENAL FUNCTION PANEL
Albumin: 2.3 g/dL — ABNORMAL LOW (ref 3.5–5.0)
Anion gap: 14 (ref 5–15)
BUN: 10 mg/dL (ref 8–23)
CO2: 23 mmol/L (ref 22–32)
Calcium: 8.3 mg/dL — ABNORMAL LOW (ref 8.9–10.3)
Chloride: 96 mmol/L — ABNORMAL LOW (ref 98–111)
Creatinine, Ser: 3.52 mg/dL — ABNORMAL HIGH (ref 0.44–1.00)
GFR calc Af Amer: 14 mL/min — ABNORMAL LOW (ref >60)
GFR calc non Af Amer: 12 mL/min — ABNORMAL LOW (ref >60)
Glucose, Bld: 78 mg/dL (ref 70–99)
Phosphorus: 3.5 mg/dL (ref 2.5–4.6)
Potassium: 3.5 mmol/L (ref 3.5–5.1)
Sodium: 133 mmol/L — ABNORMAL LOW (ref 135–145)

## 2019-07-08 LAB — GLUCOSE, CAPILLARY
Glucose-Capillary: 80 mg/dL (ref 70–99)
Glucose-Capillary: 138 mg/dL — ABNORMAL HIGH (ref 70–99)

## 2019-07-08 MED ORDER — LABETALOL HCL 100 MG PO TABS
100.0000 mg | ORAL_TABLET | Freq: Two times a day (BID) | ORAL | Status: DC
  Filled 2019-07-08: qty 1

## 2019-07-08 MED ORDER — LABETALOL HCL 100 MG PO TABS: 100.0000 mg | ORAL_TABLET | Freq: Two times a day (BID) | ORAL | 0 refills | Status: DC

## 2019-07-08 NOTE — Progress Notes (Signed)
Physical Therapy Treatment Patient Details Name: Taylor Tyler MRN: [id number] DOB: Jun 23, 1941 Today's Date: 07/08/2019    History of Present Illness Pt is a 78 y.o. female admitted 05/30/19 after fall at home, laying on the floor at least 8 hours until found by son, also with worsening SOB and LE edema. VQ scan with low probability for PE. Worked up for HF, AKI. Course complicated by leukocytosis and fevers on 11/11. Pt with chronic lumbar wound infection/dehiscense with fusion from L3-S1; now s/p hardward removal on 11/19 with wound VAC placement. Other PMH includes CKD III, DM2, DJD s/p lumbar sx (2018), arthritis.    PT Comments    PT and OT saw pt together to attempt mobility progression. Pt limited by hypotension this session, but participated well in LE strengthening exercises, and was agreeable to gait training but limited by symptomatic hypotension. Pt plans to d/c to SNF today, if not PT to continue to follow acutely.    Follow Up Recommendations  SNF     Equipment Recommendations  None recommended by PT    Recommendations for Other Services       Precautions / Restrictions Precautions Precautions: Fall Restrictions Weight Bearing Restrictions: No    Mobility  Bed Mobility Overal bed mobility: Needs Assistance Bed Mobility: Supine to Sit;Sit to Supine     Supine to sit: +2 for safety/equipment;Min assist;HOB elevated;Mod assist Sit to supine: Min assist;HOB elevated;+2 for safety/equipment   General bed mobility comments: mod assist for first supine to sit for trunk elevation and LE management, required min for second attempt. supine<>sit x2 due to pt dizziness upon first sitting EOB. Pt with use of HOB elevation to perform.  Transfers Overall transfer level: Needs assistance Equipment used: Rolling walker (2 wheeled) Transfers: Sit to/from Stand Sit to Stand: Mod assist;Min assist;+2 physical assistance;From elevated surface         General transfer comment:  min-mod assist +2 for power up and steadying while pt righting self. Pt with very increased time to perform hip extension to neutral to bring chest upright. Pt reporting dizziness upon standing for ~10 seconds, requesting to sit down. BP at rest 112/48, sitting EOB 107/55, BP standing 90/49  Ambulation/Gait             General Gait Details: unable   Stairs             Wheelchair Mobility    Modified Rankin (Stroke Patients Only)       Balance Overall balance assessment: Needs assistance Sitting-balance support: Feet supported;No upper extremity supported Sitting balance-Leahy Scale: Fair Sitting balance - Comments: pt sat EOB ~ 6 minute with close min guard for safety   Standing balance support: Bilateral upper extremity supported Standing balance-Leahy Scale: Poor Standing balance comment: reliant on external support and BUE support                            Cognition Arousal/Alertness: Awake/alert Behavior During Therapy: WFL for tasks assessed/performed Overall Cognitive Status: Impaired/Different from baseline Area of Impairment: Orientation;Attention;Memory;Following commands;Safety/judgement;Awareness;Problem solving                 Orientation Level: Disoriented to;Time;Situation Current Attention Level: Sustained Memory: Decreased short-term memory Following Commands: Follows one step commands with increased time Safety/Judgement: Decreased awareness of safety;Decreased awareness of deficits Awareness: Intellectual Problem Solving: Slow processing;Decreased initiation;Difficulty sequencing;Requires verbal cues General Comments: pt pleasant and more participatory during session. Pt disoriented to time asking therapist  if she had missed her cousins birthday which was in April.      Exercises General Exercises - Lower Extremity Long Arc Quad: AROM;Strengthening;Both;10 reps;Seated Heel Slides: AROM;Both;10 reps;Supine Hip  ABduction/ADduction: Both;AROM;10 reps;Supine    General Comments General comments (skin integrity, edema, etc.): pt hypotensive during session. supine: 107/55, EOB after standing: 90/49.      Pertinent Vitals/Pain Pain Assessment: Faces Faces Pain Scale: Hurts little more Pain Location: low back Pain Descriptors / Indicators: Grimacing;Sore;Discomfort Pain Intervention(s): Limited activity within patient's tolerance;Monitored during session;Repositioned    Home Living                      Prior Function            PT Goals (current goals can now be found in the care plan section) Acute Rehab PT Goals Patient Stated Goal: not stated PT Goal Formulation: With patient Time For Goal Achievement: 07/08/19 Potential to Achieve Goals: Good Progress towards PT goals: Progressing toward goals    Frequency    Min 2X/week      PT Plan Current plan remains appropriate    Co-evaluation PT/OT/SLP Co-Evaluation/Treatment: Yes Reason for Co-Treatment: For patient/therapist safety;To address functional/ADL transfers PT goals addressed during session: Mobility/safety with mobility;Strengthening/ROM OT goals addressed during session: ADL's and self-care      AM-PAC PT "6 Clicks" Mobility   Outcome Measure  Help needed turning from your back to your side while in a flat bed without using bedrails?: A Little Help needed moving from lying on your back to sitting on the side of a flat bed without using bedrails?: A Lot Help needed moving to and from a bed to a chair (including a wheelchair)?: A Lot Help needed standing up from a chair using your arms (e.g., wheelchair or bedside chair)?: A Lot Help needed to walk in hospital room?: A Lot Help needed climbing 3-5 steps with a railing? : Total 6 Click Score: 12    End of Session Equipment Utilized During Treatment: Gait belt Activity Tolerance: Patient limited by fatigue;Treatment limited secondary to medical complications  (Comment)(dizziness, hypotension) Patient left: in bed;with call bell/phone within reach Nurse Communication: Mobility status PT Visit Diagnosis: Muscle weakness (generalized) (M62.81)     Time: 2119-4174 PT Time Calculation (min) (ACUTE ONLY): 28 min  Charges:  $Therapeutic Activity: 8-22 mins                    Ethelbert Thain E, PT Acute Rehabilitation Services Pager [phone]  Office [phone]   Syair Fricker D Laelia Angelo 07/08/2019, 2:04 PM

## 2019-07-08 NOTE — Discharge Summary (Signed)
Discharge Summary  Taylor Tyler AUQ:[id number] DOB: 1940-12-01  PCP: McLean-Scocuzza, Nino Glow, MD  Admit date: 05/30/2019 Discharge date: 07/08/2019  Time spent: 25 minutes  Recommendations for Outpatient Follow-up:  1. Follow up with PCP in 1-2 weeks 2. Please obtain BMP/CBC in one week 3. Needs HD T, Thus, Saturday.  4. Needs IV antibiotics with HD.  5. Needs to follow up with Dr Kathyrn Sheriff post sx.  Discharge Diagnoses:  Active Hospital Problems   Diagnosis Date Noted   Acute CHF (congestive heart failure) ([place]) 05/30/2019   Fever    Pressure injury of skin 06/08/2019   Essential hypertension    Acute renal failure superimposed on stage 3 chronic kidney disease ([place]) 03/14/2017   Anemia 03/14/2017   DM (diabetes mellitus), type 2 with renal complications ([place]) 63/89/3734   Hypothyroidism 01/15/2017    Resolved Hospital Problems  No resolved problems to display.    Vitals:   07/08/19 0309 07/08/19 0742  BP: (!) 106/44 (!) 127/48  Pulse: 67 69  Resp: 18 18  Temp: 98 F (36.7 C) (!) 97.5 F (36.4 C)  SpO2: 99% 99%    Discharge Condition; Stable.  CODE STATUS: full code Diet recommendation: Heart Healthy   Brief/Interim Summary: 78 yo with diabetes mellitus type 2 presently not on medication, chronic diastolic hypertension, chronic kidney disease stage III, anemia, hypothyroidism, hyperlipidemia.   She presented to the ED on 10/26 for dyspnea after a fall out of bed the night before and being on the floor for about 8 hrs. Her son found her in the AM. She was short of breath for at least 2 days.  In ED, noted to be wheezing. Suspected to have acute diastolic CHF and started on Lasix. Also noted to have AKI with Cr of5.74 and K of 5.3. Past Cr 2.64. As it was felt that her renal failure was permanent, she was eventually started on dialysis. It was planned to d/c her to SNF however, course complicated by leukocytosis and fevers on 11/11.  She was  transferred to [facility] on 11/11 to a telemetry bed. She is receiving HD with nephyrology. Infectious disease has been consulted for antibiotics guidance for her sepsis due to infected back wound. Neurosurgery has also been consulted for management of this wound. She has undergone debridement of the lumbar wound with removal of hardware from fusion in 2018 which has never healed. Initially the patient was treated with IV Vancomycin and cefepime to be given with HD. However wound cultures have now grown out Proteus and she has been changed to cefazolin 2 gm to be continued through 08/04/2019. She is awaiting SNF placement.  1-Acute on chronic diastolic heart failure exacerbation: Most recent echocardiogram 06/06/2019 demonstrated left ventricular ejection fraction 55 to 60%.  Grade 2 diastolic dysfunction.  Diuresis has not been effective in managing her volume overload.  Patient was started on hemodialysis for volume control.  2-new ESRD with acute on chronic kidney disease a stage IV: End-stage renal disease, metabolic acidosis.  Despite medical management, she did not diurese well and her creatinine continued to rise.    Seen by nephrology.  Patient underwent tunneled dialysis catheter placement and started hemodialysis on 06/08/2019.  She is set up for outpatient hemodialysis for Tuesday, Thursday and Saturday.  Updated her son on 07/08/2019 and informed of importance of keeping hemodialysis appointments.  Patient and son understand and agree to plan.  3-Constipation: Started on docusate and senna.  4-Sepsis from infected back wound with fever and  leukocytosis, metabolic acidosis.  Blood cultures x2 -.  Patient had a small draining wound on her lumbar spine related to lumbar fusion from 2018.  Neurosurgery has been consulted.  The patient underwent I and D on 06/16/2019.  Infectious disease has been consulted.  Patient was initially receiving IV vancomycin and cefepime.  Wound has grown Proteus.  She has been  changed to IV cefazolin with hemodialysis to continue through 08/04/2019. She will need to follow-up with Dr. Kathyrn Sheriff for further care of back wound. She currently has a wound VAC in place.  5-Acute toxic metabolic encephalopathy, underlying Dementia.  Family has noted memory loss for about 4 to 5 years.  She has had delusion and paranoia for about 2 years.  Much improved.  6-Hypothyroidism: Continue with Synthroid. 7-Hypertension: Continue with p.o. labetalol, decrease dose to 100 mg twice daily. Continue to hold off hydralazine, Norvasc and clonidine to avoid hypotension. Maintain MAP greater than 65.  Hyperlipidemia: Continue with Lipitor.  Physical debility: PT OT assessment recommended SNF.  CSW assisting with placement.  Continue physical therapy with assistance and fall precautions.  Decubitus ulcer:  Present on admission.   Pressure Injury 06/06/19 Sacrum Left Stage I - Intact skin with non-blanchable redness of a localized area usually over a bony prominence. (Active)  06/06/19 1010  Location: Sacrum  Location Orientation: Left  Staging: Stage I - Intact skin with non-blanchable redness of a localized area usually over a bony prominence.  Wound Description (Comments):   Present on Admission: No    Pressure Injury 06/12/19 Sacrum Left Stage II - Partial thickness loss of dermis presenting as a shallow open ulcer with a red, pink wound bed without slough. Pink (Active)  06/12/19 2115  Location: Sacrum  Location Orientation: Left  Staging: Stage II - Partial thickness loss of dermis presenting as a shallow open ulcer with a red, pink wound bed without slough.  Wound Description (Comments): Pink  Present on Admission:     Pressure Injury 06/15/19 Buttocks Left Stage II - Partial thickness loss of dermis presenting as a shallow open ulcer with a red, pink wound bed without slough. 100 % pinkk (Active)  06/15/19 1102  Location: Buttocks  Location Orientation:  Left  Staging: Stage II - Partial thickness loss of dermis presenting as a shallow open ulcer with a red, pink wound bed without slough.  Wound Description (Comments): 100 % pinkk  Present on Admission: No    Hospital Course:  Principal Problem:   Acute CHF (congestive heart failure) (H[facility]) Active Problems:   Hypothyroidism   DM (diabetes mellitus), type 2 with renal complications ([facility])   Anemia   Acute renal failure superimposed on stage 3 chronic kidney disease ([facility])   Essential hypertension   Pressure injury of skin   Fever   Consultations:  Nephrology  Discharge Exam: BP (!) 127/48 (BP Location: Right Arm)    Pulse 69    Temp (!) 97.5 F (36.4 C) (Oral)    Resp 18    Ht 5\' 3"  (1.6 m)    Wt 97 kg    SpO2 99%    BMI 37.88 kg/m   General: 78 y.o. year-old female well developed well nourished in no acute distress. Alert and interactive.  Cardiovascular: Regular rate and rhythm no rubs or gallops no JVD or thyromegaly noted.    Respiratory: Clear to auscultation with no wheezes or rales.  Good inspiratory effort.  Abdomen: Soft nontender nondistended normal bowel sounds present.    Musculoskeletal:  Trace lower extremity edema.  2 out of 4 pulses in all four extremities.    Psychiatry: Mood is appropriate for condition and setting.  Discharge Instructions You were cared for by a hospitalist during your hospital stay. If you have any questions about your discharge medications or the care you received while you were in the hospital after you are discharged, you can call the unit and asked to speak with the hospitalist on call if the hospitalist that took care of you is not available. Once you are discharged, your primary care physician will handle any further medical issues. Please note that NO REFILLS for any discharge medications will be authorized once you are discharged, as it is imperative that you return to your primary care physician (or establish a relationship with a  primary care physician if you do not have one) for your aftercare needs so that they can reassess your need for medications and monitor your lab values.  Discharge Instructions    Diet - low sodium heart healthy   Complete by: As directed    Increase activity slowly   Complete by: As directed      Allergies as of 07/08/2019      Reactions   Clams [shellfish Allergy] Swelling, Other (See Comments)   THROAT SWELLS NECK TURNS RED   Hydralazine Other (See Comments)   CHEST TIGHTNESS Patient has tolerated multiple doses of hydralazine since allergy was listed    Norvasc [amlodipine Besylate] Swelling   Leg edema    Milk-related Compounds Diarrhea      Medication List    STOP taking these medications   amLODipine 10 MG tablet Commonly known as: NORVASC   cloNIDine 0.1 MG tablet Commonly known as: CATAPRES   ferrous sulfate 325 (65 FE) MG tablet   furosemide 40 MG tablet Commonly known as: LASIX   hydrALAZINE 50 MG tablet Commonly known as: APRESOLINE     TAKE these medications   acetaminophen 500 MG tablet Commonly known as: TYLENOL Take 500 mg by mouth 2 (two) times daily as needed for mild pain.   atorvastatin 40 MG tablet Commonly known as: LIPITOR Take 1 tablet (40 mg total) by mouth daily at 6 PM.   ceFAZolin 2-4 GM/100ML-% IVPB Commonly known as: ANCEF Inject 100 mLs (2 g total) into the vein every Tuesday, Thursday, and Saturday at 6 PM.   gabapentin 100 MG capsule Commonly known as: NEURONTIN Take 2 capsules (200 mg total) by mouth at bedtime. What changed:   medication strength  how much to take   labetalol 100 MG tablet Commonly known as: NORMODYNE Take 1 tablet (100 mg total) by mouth 2 (two) times daily.   lactulose 10 GM/15ML solution Commonly known as: CHRONULAC Take 30 mLs (20 g total) by mouth daily.   levalbuterol 1.25 MG/0.5ML nebulizer solution Commonly known as: XOPENEX Take 1.25 mg by nebulization every 6 (six) hours as needed for  wheezing or shortness of breath.   levothyroxine 100 MCG tablet Commonly known as: SYNTHROID Take 100 mcg by mouth daily before breakfast.   sevelamer carbonate 800 MG tablet Commonly known as: RENVELA Take 1 tablet (800 mg total) by mouth 3 (three) times daily with meals.      Allergies  Allergen Reactions   Clams [Shellfish Allergy] Swelling and Other (See Comments)    THROAT SWELLS NECK TURNS RED   Hydralazine Other (See Comments)    CHEST TIGHTNESS Patient has tolerated multiple doses of hydralazine since allergy was listed  Norvasc [Amlodipine Besylate] Swelling    Leg edema    Milk-Related Compounds Diarrhea   Follow-up Information    [facility], [facility] Follow up.   Specialty: Home Health Services Why: HHPT, [place] information: [place] [address] [phone]        Rosetta Posner, MD In 6 weeks.   Specialties: Vascular Surgery, Cardiology Why: Office will call you to arrange your appt (sent) Contact information: [place] [address] [phone]        Consuella Lose, MD Follow up in 1 week(s).   Specialty: Neurosurgery Contact information: [address] [place] [address] [phone]            The results of significant diagnostics from this hospitalization (including imaging, microbiology, ancillary and laboratory) are listed below for reference.    Significant Diagnostic Studies: Ct Lumbar Spine Wo Contrast  Result Date: 06/17/2019 CLINICAL DATA:  Follow-up lumbar fusion. EXAM: CT LUMBAR SPINE WITHOUT CONTRAST TECHNIQUE: Multidetector CT imaging of the lumbar spine was performed without intravenous contrast administration. Multiplanar CT image reconstructions were also generated. COMPARISON:  MRI 05/20/2019.  CT 02/24/2017. FINDINGS: Segmentation: 5 lumbar type vertebral bodies as numbered previously. Alignment: Normal Vertebrae: No regional fracture.  Previous pedicle screws and posterior rods from L3 through S1. Discectomy and fusion material at L3-4, L4-5 and L5-S1. Anterior plate and screws at N6-E9. Paraspinal and other soft tissues: There is fluid extending as better shown at MRI, posteriorly from the soft tissues posterior to the surgical region extending to the skin surface. This is not well characterized using ordinary CT. Disc levels: T11-12 and T12-L1: Normal. L1-2: Disc degeneration with vacuum phenomenon. Mild bulging of the disc. Mild facet osteoarthritis. No compressive stenosis. L2-3: Disc degeneration with bulging of the disc. Bilateral facet hypertrophy. Stenosis of the lateral recesses and foramina, left more than right. Neural compression could occur at this level, more likely on the left. L3 to sacrum: There appears to be solid fusion across the disc space at L3-4. There is lucency around the L3 screws, which is surprising given the appearance of solid union. No loosening of the L4 screws. At L4-5, there is no sign of motion. I cannot definitely establish solid bone union across the disc space, but absence of signs of motion suggest union. At L5-S1, there is no screw loosening. There is solid union across the disc space. IMPRESSION: Satisfactory appearance in the fusion segment from L3 to the sacrum. Definite solid union at L3-4 and L5-S1. Lucency around the screws at L3 is difficult to explain given the appearance of solid union across the disc space in the absence of other signs of motion. At L4-5, there is no screw loosening and presumably there is solid union. Definite solid union at L5-S1. Satisfactory patency of the canal and foramina throughout that segment. Adjacent segment degenerative changes at L2-3 with bulging of the disc and facet and ligamentous hypertrophy resulting in narrowing of the lateral recesses and foramina left more than right that could be symptomatic. Electronically Signed   By: Nelson Chimes M.D.   On: 06/17/2019  14:28   Dg Fluoro Guide Cv Line-no Report  Result Date: 06/08/2019 Fluoroscopy was utilized by the requesting physician.  No radiographic interpretation.    Microbiology: Recent Results (from the past 240 hour(s))  SARS CORONAVIRUS 2 (TAT 6-24 HRS) Nasopharyngeal Nasopharyngeal Swab     Status: None   Collection Time: 07/01/19 12:12 PM  Specimen: Nasopharyngeal Swab  Result Value Ref Range Status   SARS Coronavirus 2 NEGATIVE NEGATIVE Final    Comment: (NOTE) SARS-CoV-2 target nucleic acids are NOT DETECTED. The SARS-CoV-2 RNA is generally detectable in upper and lower respiratory specimens during the acute phase of infection. Negative results do not preclude SARS-CoV-2 infection, do not rule out co-infections with other pathogens, and should not be used as the sole basis for treatment or other patient management decisions. Negative results must be combined with clinical observations, patient history, and epidemiological information. The expected result is Negative. Fact Sheet for Patients: SugarRoll.be Fact Sheet for Healthcare Providers: https://www.woods-mathews.com/ This test is not yet approved or cleared by the [place] FDA and  has been authorized for detection and/or diagnosis of SARS-CoV-2 by FDA under an Emergency Use Authorization (EUA). This EUA will remain  in effect (meaning this test can be used) for the duration of the COVID-19 declaration under Section 56 4(b)(1) of the Act, 21 U.S.C. section 360bbb-3(b)(1), unless the authorization is terminated or revoked sooner. Performed at [facility], [place] [address], [place], [place] [address]   SARS CORONAVIRUS 2 (TAT 6-24 HRS) Nasopharyngeal Nasopharyngeal Swab     Status: None   Collection Time: 07/05/19 12:32 PM   Specimen: Nasopharyngeal Swab  Result Value Ref Range Status   SARS Coronavirus 2 NEGATIVE NEGATIVE Final    Comment: (NOTE) SARS-CoV-2 target  nucleic acids are NOT DETECTED. The SARS-CoV-2 RNA is generally detectable in upper and lower respiratory specimens during the acute phase of infection. Negative results do not preclude SARS-CoV-2 infection, do not rule out co-infections with other pathogens, and should not be used as the sole basis for treatment or other patient management decisions. Negative results must be combined with clinical observations, patient history, and epidemiological information. The expected result is Negative. Fact Sheet for Patients: SugarRoll.be Fact Sheet for Healthcare Providers: https://www.woods-mathews.com/ This test is not yet approved or cleared by the [place] FDA and  has been authorized for detection and/or diagnosis of SARS-CoV-2 by FDA under an Emergency Use Authorization (EUA). This EUA will remain  in effect (meaning this test can be used) for the duration of the COVID-19 declaration under Section 56 4(b)(1) of the Act, 21 U.S.C. section 360bbb-3(b)(1), unless the authorization is terminated or revoked sooner. Performed at [facility], [place] [address], [place],  [address]      Labs: Basic Metabolic Panel: Recent Labs  Lab 07/05/19 0458 07/05/19 0828 07/06/19 0632 07/07/19 0816 07/08/19 0433  NA 134* 132* 137 131* 133*  K 4.4 4.4 3.6 4.4 3.5  CL 93* 93* 95* 92* 96*  CO2 22 24 28 25 23   GLUCOSE 77 99 115* 80 78  BUN 46* 47* 17 27* 10  CREATININE 7.89* 8.09* 4.66* 6.58* 3.52*  CALCIUM 8.7* 8.5* 9.0 8.9 8.3*  PHOS 5.1* 5.4* 4.3 4.7* 3.5   Liver Function Tests: Recent Labs  Lab 07/05/19 0458 07/05/19 0828 07/06/19 0632 07/07/19 0816 07/08/19 0433  ALBUMIN 2.2* 2.2* 2.4* 2.5* 2.3*   No results for input(s): LIPASE, AMYLASE in the last 168 hours. No results for input(s): AMMONIA in the last 168 hours. CBC: Recent Labs  Lab 07/02/19 0235 07/03/19 0335 07/04/19 0440 07/05/19 0829 07/07/19 1424  WBC 11.7*  12.2* 8.2 8.5 9.4  NEUTROABS 9.1* 9.2* 5.7  --   --   HGB 8.5* 8.8* 8.9* 8.7* 8.8*  HCT 28.6* 29.6* 29.9* 29.2* 29.4*  MCV 89.7 90.8 91.2 92.1 92.5  PLT 125* 124* 148* 174 145*   Cardiac Enzymes: No results for input(s): CKTOTAL, CKMB, CKMBINDEX, TROPONINI in the last 168 hours. BNP: BNP (last 3 results) Recent Labs    05/30/19 2056  BNP 851.4*    ProBNP (last 3 results) No results for input(s): PROBNP in the last 8760 hours.  CBG: Recent Labs  Lab 07/06/19 2125 07/07/19 0811 07/07/19 1107 07/07/19 2059 07/08/19 0740  GLUCAP 80 77 87 88 80       Signed:  Kayleen Memos, MD [facility] 07/08/2019, 9:42 AM

## 2019-07-08 NOTE — Progress Notes (Signed)
[name], female   DOB: 12/09/1940, 78 y.o.   MRN: [id number]  [facility] Progress Note   Assessment/ Plan:   1.  Sepsis secondary to infected wound over back: On intravenous antibiotics (cefazolin with end date of 08/04/2019) and wound VAC in place with outpatient follow-up with neurosurgery. 2. ESRD: New start to dialysis, continue TTS schedule with anticipated discharge today to begin dialysis as an outpatient in [place] tomorrow. 3. Anemia: Low hemoglobin/hematocrit likely compounded by inflammatory complex with wound.  Continue ESA/iron supplementation as permitted. 4. CKD-MBD: Continue renal diet and calcium/phosphorus monitoring.  Phosphorus currently at goal on sevelamer. 5. Nutrition: Continue renal diet with protein supplements. 6. Hypertension: Blood pressure under good control with recent down titration of antihypertensive therapy.  We will continue to follow with HD/UF.  Subjective:   Reports that she is tired of being in the hospital and inquires of disposition.   Objective:   BP (!) 112/48 (BP Location: Right Arm)   Pulse 63   Temp 98 F (36.7 C) (Oral)   Resp 15   Ht 5\' 3"  (1.6 m)   Wt 97 kg   SpO2 98%   BMI 37.88 kg/m   Physical Exam: Gen: Comfortably resting in bed, watching television CVS: Pulse regular rhythm, normal rate, S1 and S2 normal Resp: Clear to auscultation, no rales/rhonchi.  Right IJ TDC in situ. Abd: Soft, obese, nontender Ext: Trace lower extremity edema, left upper arm aVF  Labs: BMET Recent Labs  Lab 07/03/19 0335 07/04/19 0440 07/05/19 0458 07/05/19 0828 07/06/19 0632 07/07/19 0816 07/08/19 0433  NA 136 132* 134* 132* 137 131* 133*  K 3.9 3.9 4.4 4.4 3.6 4.4 3.5  CL 95* 92* 93* 93* 95* 92* 96*  CO2 24 24 22 24 28 25 23   GLUCOSE 94 82 77 99 115* 80 78  BUN 22 36* 46* 47* 17 27* 10  CREATININE 4.41* 6.39* 7.89* 8.09* 4.66* 6.58* 3.52*  CALCIUM 8.6* 8.5* 8.7* 8.5* 9.0 8.9 8.3*  PHOS 3.8 5.2* 5.1*  5.4* 4.3 4.7* 3.5   CBC Recent Labs  Lab 07/02/19 0235 07/03/19 0335 07/04/19 0440 07/05/19 0829 07/07/19 1424  WBC 11.7* 12.2* 8.2 8.5 9.4  NEUTROABS 9.1* 9.2* 5.7  --   --   HGB 8.5* 8.8* 8.9* 8.7* 8.8*  HCT 28.6* 29.6* 29.9* 29.2* 29.4*  MCV 89.7 90.8 91.2 92.1 92.5  PLT 125* 124* 148* 174 145*   Medications:    . atorvastatin  40 mg Oral q1800  . Chlorhexidine Gluconate Cloth  6 each Topical Q0600  . darbepoetin (ARANESP) injection - DIALYSIS  150 mcg Intravenous Q Sat-HD  . docusate sodium  100 mg Oral BID  . gabapentin  200 mg Oral QHS  . labetalol  100 mg Oral BID  . lactulose  20 g Oral Daily  . levothyroxine  100 mcg Oral QAC breakfast  . sevelamer carbonate  800 mg Oral TID WC   Elmarie Shiley, MD 07/08/2019, 12:35 PM

## 2019-07-08 NOTE — Progress Notes (Signed)
Renal Navigator was notified by CSW/C. Wynetta Emery that patient has been re-authorized for admission to SNF and will discharged today. Navigator notified Nephrologist and OP HD clinic. Patient will start in the OP HD clinic/[facility] on [address] tomorrow, Saturday 12/5. Paperwork has been signed by her son and therefore she needs to arrive to the clinic at 9:00am for her 9:20am seat time. CSW aware and remind SNF of this information.   Alphonzo Cruise, [place] Renal Navigator [phone]

## 2019-07-08 NOTE — Progress Notes (Signed)
Occupational Therapy Treatment Patient Details Name: Taylor Tyler MRN: [id number] DOB: [birth year] Today's Date: 07/08/2019    History of present illness Pt is a 78 y.o. female admitted 05/30/19 after fall at home, laying on the floor at least 8 hours until found by son, also with worsening SOB and LE edema. VQ scan with low probability for PE. Worked up for HF, AKI. Course complicated by leukocytosis and fevers on 11/11. Pt with chronic lumbar wound infection/dehiscense with fusion from L3-S1; now s/p hardward removal on 11/19 with wound VAC placement. Other PMH includes CKD III, DM2, DJD s/p lumbar sx (2018), arthritis.   OT comments  Pt making slow but steady progress towards OT goals this session. Session focus on functional mobility as precursor to higher level ADLs. Pt agreeable to OOB activity with MAX encouragement. Pt complete sit>stand with min- mod A +2 with RW. Upon standing, pt reports dizziness needing to lay back down ( see comments section for vitals). D/t dizziness pt completed bed level grooming tasks wit set- up assist. DC plan remains appropriate, will continue to follow acutely per POC.    Follow Up Recommendations  SNF;Supervision/Assistance - 24 hour    Equipment Recommendations  None recommended by OT    Recommendations for Other Services      Precautions / Restrictions Precautions Precautions: Fall Restrictions Weight Bearing Restrictions: No       Mobility Bed Mobility Overal bed mobility: Needs Assistance Bed Mobility: Supine to Sit;Sit to Supine     Supine to sit: +2 for safety/equipment;Min assist;HOB elevated Sit to supine: Min assist;HOB elevated;+2 for safety/equipment   General bed mobility comments: pt complete supine>sit x2 d/t feeling dizzy, use of bed rails and elevated HOB  Transfers Overall transfer level: Needs assistance Equipment used: Rolling walker (2 wheeled) Transfers: Sit to/from Stand Sit to Stand: Mod assist;Min assist;+2  physical assistance;From elevated surface         General transfer comment: cues for hand placement, assist to power up. increased time and effort    Balance Overall balance assessment: Needs assistance Sitting-balance support: Feet supported;No upper extremity supported Sitting balance-Leahy Scale: Fair Sitting balance - Comments: pt sat EOB ~ 6 minute with close min guard for safety   Standing balance support: Bilateral upper extremity supported Standing balance-Leahy Scale: Poor Standing balance comment: reliant on external support and BUE support                           ADL either performed or assessed with clinical judgement   ADL Overall ADL's : Needs assistance/impaired     Grooming: Oral care;Bed level;Wash/dry face;Set up                   Toilet Transfer: Minimal assistance;Moderate assistance;+2 for physical assistance;RW Toilet Transfer Details (indicate cue type and reason): attempted simulated toilet transfer with pt needing MIN - MOD A +2 for sit>stand, pt reported feeling dizzy needing to sit back down upon stand         Functional mobility during ADLs: Moderate assistance;Rolling walker;Cueing for safety;Cueing for sequencing;Minimal assistance(sit>stand only) General ADL Comments: pt agreeable to OOB activity but reports dizziness upon initial stand needing to sit back down     Vision Baseline Vision/History: No visual deficits     Perception     Praxis      Cognition Arousal/Alertness: Awake/alert Behavior During Therapy: WFL for tasks assessed/performed Overall Cognitive Status: Impaired/Different from baseline Area of Impairment:  Orientation;Attention;Memory;Following commands;Safety/judgement;Awareness;Problem solving                 Orientation Level: Disoriented to;Time Current Attention Level: Sustained Memory: Decreased short-term memory Following Commands: Follows one step commands with increased  time Safety/Judgement: Decreased awareness of safety;Decreased awareness of deficits Awareness: Intellectual Problem Solving: Slow processing;Decreased initiation;Difficulty sequencing;Requires verbal cues General Comments: pt pleasant and more participatory during session. Pt disorineted to time asking therapist if she had missed her cousins birthday which was in April.        Exercises     Shoulder Instructions       General Comments pt hypotensive during session. supine: 107/55, EOB after standing: 90/49.    Pertinent Vitals/ Pain       Pain Assessment: Faces Faces Pain Scale: Hurts a little bit Pain Location: lower back, general discomfort Pain Descriptors / Indicators: Grimacing;Sore;Discomfort Pain Intervention(s): Monitored during session;Repositioned  Home Living                                          Prior Functioning/Environment              Frequency  Min 2X/week        Progress Toward Goals  OT Goals(current goals can now be found in the care plan section)  Progress towards OT goals: Progressing toward goals  Acute Rehab OT Goals Patient Stated Goal: not stated OT Goal Formulation: With patient Time For Goal Achievement: 07/08/19 Potential to Achieve Goals: [place] Discharge plan remains appropriate    Co-evaluation    PT/OT/SLP Co-Evaluation/Treatment: Yes Reason for Co-Treatment: For patient/therapist safety;To address functional/ADL transfers   OT goals addressed during session: ADL's and self-care      AM-PAC OT "6 Clicks" Daily Activity     Outcome Measure   Help from another person eating meals?: A Little Help from another person taking care of personal grooming?: A Lot Help from another person toileting, which includes using toliet, bedpan, or urinal?: A Lot Help from another person bathing (including washing, rinsing, drying)?: A Lot Help from another person to put on and taking off regular upper body  clothing?: A Little Help from another person to put on and taking off regular lower body clothing?: A Lot 6 Click Score: 14    End of Session Equipment Utilized During Treatment: Gait belt;Rolling walker  OT Visit Diagnosis: Unsteadiness on feet (R26.81);Other symptoms and signs involving cognitive function;Muscle weakness (generalized) (M62.81)   Activity Tolerance Patient tolerated treatment well   Patient Left in bed;with call bell/phone within reach   Nurse Communication Mobility status        Time: 1916-6060 OT Time Calculation (min): 28 min  Charges: OT General Charges $OT Visit: 1 Visit OT Treatments $Self Care/Home Management : 8-22 mins  Lanier Clam., COTA/L Acute Rehabilitation Services [phone]    JAZZY PARMER 07/08/2019, 12:54 PM

## 2019-07-08 NOTE — TOC Transition Note (Signed)
Transition of Care [facility]) - CM/SW Discharge Note   Patient Details  Name: Taylor Tyler MRN: [id number] Date of Birth: 06-06-1941  [facility] CM/SW Contact:  Vinie Sill, [place] Phone Number: 07/08/2019, 1:08 PM   Clinical Narrative:     Patient will DC to: [place] Date: 07/08/2019 Family Notified: [name], son Transport [id number] @ 2pm  RN, patient, and facility notified of DC. Discharge Summary sent to facility. RN given number for [phone]. Ambulance transport requested for patient.   Clinical Social Worker signing off. Thurmond Butts, MSW, [facility] Clinical Social Worker [phone]    Final next level of care: Skilled Nursing Facility Barriers to Discharge: Insurance Authorization   Patient Goals and CMS Choice Patient states their goals for this hospitalization and ongoing recovery are:: Pt son wants her to feel better CMS Medicare.gov Compare Post Acute Care list provided to:: Other (Comment Required)(Declined) Choice offered to / list presented to : NA  Discharge Placement PASRR number recieved: 06/13/19            Patient chooses bed at: [facility] Patient to be transferred to facility by: [place] Name of family member notified: [name], son Patient and family notified of of transfer: 07/04/19  Discharge Plan and Services In-house Referral: NA Discharge Planning Services: CM Consult Post Acute Care Choice: [place]          DME Arranged: (NA)         HH Arranged: RN, PT [facility] Agency: [place] Date [facility] Agency Contacted: 06/10/19 Time [facility]: [id number] Representative spoke with at [place]: cory  Social Determinants of Health ([place]) Interventions     Readmission Risk Interventions No flowsheet data found.

## 2019-07-09 DIAGNOSIS — N186 End stage renal disease: Secondary | ICD-10-CM | POA: Diagnosis not present

## 2019-07-09 DIAGNOSIS — R509 Fever, unspecified: Secondary | ICD-10-CM | POA: Diagnosis not present

## 2019-07-09 DIAGNOSIS — Z992 Dependence on renal dialysis: Secondary | ICD-10-CM | POA: Diagnosis not present

## 2019-07-09 DIAGNOSIS — E1122 Type 2 diabetes mellitus with diabetic chronic kidney disease: Secondary | ICD-10-CM | POA: Diagnosis not present

## 2019-07-09 DIAGNOSIS — D689 Coagulation defect, unspecified: Secondary | ICD-10-CM | POA: Diagnosis not present

## 2019-07-09 DIAGNOSIS — R52 Pain, unspecified: Secondary | ICD-10-CM | POA: Diagnosis not present

## 2019-07-09 DIAGNOSIS — A499 Bacterial infection, unspecified: Secondary | ICD-10-CM | POA: Diagnosis not present

## 2019-07-09 DIAGNOSIS — D631 Anemia in chronic kidney disease: Secondary | ICD-10-CM | POA: Diagnosis not present

## 2019-07-09 DIAGNOSIS — D509 Iron deficiency anemia, unspecified: Secondary | ICD-10-CM | POA: Diagnosis not present

## 2019-07-09 DIAGNOSIS — R519 Headache, unspecified: Secondary | ICD-10-CM | POA: Diagnosis not present

## 2019-07-12 DIAGNOSIS — Z992 Dependence on renal dialysis: Secondary | ICD-10-CM | POA: Diagnosis not present

## 2019-07-12 DIAGNOSIS — R509 Fever, unspecified: Secondary | ICD-10-CM | POA: Diagnosis not present

## 2019-07-12 DIAGNOSIS — R110 Nausea: Secondary | ICD-10-CM | POA: Diagnosis not present

## 2019-07-12 DIAGNOSIS — D689 Coagulation defect, unspecified: Secondary | ICD-10-CM | POA: Diagnosis not present

## 2019-07-12 DIAGNOSIS — D509 Iron deficiency anemia, unspecified: Secondary | ICD-10-CM | POA: Diagnosis not present

## 2019-07-12 DIAGNOSIS — E1122 Type 2 diabetes mellitus with diabetic chronic kidney disease: Secondary | ICD-10-CM | POA: Diagnosis not present

## 2019-07-12 DIAGNOSIS — D631 Anemia in chronic kidney disease: Secondary | ICD-10-CM | POA: Diagnosis not present

## 2019-07-12 DIAGNOSIS — R519 Headache, unspecified: Secondary | ICD-10-CM | POA: Diagnosis not present

## 2019-07-12 DIAGNOSIS — R52 Pain, unspecified: Secondary | ICD-10-CM | POA: Diagnosis not present

## 2019-07-12 DIAGNOSIS — A499 Bacterial infection, unspecified: Secondary | ICD-10-CM | POA: Diagnosis not present

## 2019-07-12 DIAGNOSIS — N186 End stage renal disease: Secondary | ICD-10-CM | POA: Diagnosis not present

## 2019-07-13 DIAGNOSIS — A419 Sepsis, unspecified organism: Secondary | ICD-10-CM | POA: Diagnosis not present

## 2019-07-13 DIAGNOSIS — I5033 Acute on chronic diastolic (congestive) heart failure: Secondary | ICD-10-CM | POA: Diagnosis not present

## 2019-07-13 DIAGNOSIS — G301 Alzheimer's disease with late onset: Secondary | ICD-10-CM | POA: Diagnosis not present

## 2019-07-13 DIAGNOSIS — L8990 Pressure ulcer of unspecified site, unspecified stage: Secondary | ICD-10-CM | POA: Diagnosis not present

## 2019-07-14 DIAGNOSIS — D509 Iron deficiency anemia, unspecified: Secondary | ICD-10-CM | POA: Diagnosis not present

## 2019-07-14 DIAGNOSIS — E1122 Type 2 diabetes mellitus with diabetic chronic kidney disease: Secondary | ICD-10-CM | POA: Diagnosis not present

## 2019-07-14 DIAGNOSIS — N186 End stage renal disease: Secondary | ICD-10-CM | POA: Diagnosis not present

## 2019-07-14 DIAGNOSIS — D689 Coagulation defect, unspecified: Secondary | ICD-10-CM | POA: Diagnosis not present

## 2019-07-14 DIAGNOSIS — R52 Pain, unspecified: Secondary | ICD-10-CM | POA: Diagnosis not present

## 2019-07-14 DIAGNOSIS — R519 Headache, unspecified: Secondary | ICD-10-CM | POA: Diagnosis not present

## 2019-07-14 DIAGNOSIS — D631 Anemia in chronic kidney disease: Secondary | ICD-10-CM | POA: Diagnosis not present

## 2019-07-14 DIAGNOSIS — A499 Bacterial infection, unspecified: Secondary | ICD-10-CM | POA: Diagnosis not present

## 2019-07-14 DIAGNOSIS — R509 Fever, unspecified: Secondary | ICD-10-CM | POA: Diagnosis not present

## 2019-07-14 DIAGNOSIS — Z992 Dependence on renal dialysis: Secondary | ICD-10-CM | POA: Diagnosis not present

## 2019-07-16 DIAGNOSIS — N186 End stage renal disease: Secondary | ICD-10-CM | POA: Diagnosis not present

## 2019-07-16 DIAGNOSIS — R509 Fever, unspecified: Secondary | ICD-10-CM | POA: Diagnosis not present

## 2019-07-16 DIAGNOSIS — D509 Iron deficiency anemia, unspecified: Secondary | ICD-10-CM | POA: Diagnosis not present

## 2019-07-16 DIAGNOSIS — A499 Bacterial infection, unspecified: Secondary | ICD-10-CM | POA: Diagnosis not present

## 2019-07-16 DIAGNOSIS — D689 Coagulation defect, unspecified: Secondary | ICD-10-CM | POA: Diagnosis not present

## 2019-07-16 DIAGNOSIS — Z992 Dependence on renal dialysis: Secondary | ICD-10-CM | POA: Diagnosis not present

## 2019-07-16 DIAGNOSIS — R52 Pain, unspecified: Secondary | ICD-10-CM | POA: Diagnosis not present

## 2019-07-16 DIAGNOSIS — R519 Headache, unspecified: Secondary | ICD-10-CM | POA: Diagnosis not present

## 2019-07-16 DIAGNOSIS — D631 Anemia in chronic kidney disease: Secondary | ICD-10-CM | POA: Diagnosis not present

## 2019-07-16 DIAGNOSIS — E1122 Type 2 diabetes mellitus with diabetic chronic kidney disease: Secondary | ICD-10-CM | POA: Diagnosis not present

## 2019-07-18 ENCOUNTER — Telehealth: Payer: Self-pay | Admitting: *Deleted

## 2019-07-18 NOTE — Telephone Encounter (Signed)
Tried to call patient and scheduled appointment to close care gaps needs to be scheduled before 08/04/19 . Please schedule and advise when scheduled.

## 2019-07-19 ENCOUNTER — Encounter (HOSPITAL_COMMUNITY): Payer: Medicare Other

## 2019-07-19 DIAGNOSIS — D631 Anemia in chronic kidney disease: Secondary | ICD-10-CM | POA: Diagnosis not present

## 2019-07-19 DIAGNOSIS — N186 End stage renal disease: Secondary | ICD-10-CM | POA: Diagnosis not present

## 2019-07-19 DIAGNOSIS — R509 Fever, unspecified: Secondary | ICD-10-CM | POA: Diagnosis not present

## 2019-07-19 DIAGNOSIS — E1122 Type 2 diabetes mellitus with diabetic chronic kidney disease: Secondary | ICD-10-CM | POA: Diagnosis not present

## 2019-07-19 DIAGNOSIS — D689 Coagulation defect, unspecified: Secondary | ICD-10-CM | POA: Diagnosis not present

## 2019-07-19 DIAGNOSIS — D509 Iron deficiency anemia, unspecified: Secondary | ICD-10-CM | POA: Diagnosis not present

## 2019-07-19 DIAGNOSIS — R519 Headache, unspecified: Secondary | ICD-10-CM | POA: Diagnosis not present

## 2019-07-19 DIAGNOSIS — A499 Bacterial infection, unspecified: Secondary | ICD-10-CM | POA: Diagnosis not present

## 2019-07-19 DIAGNOSIS — R52 Pain, unspecified: Secondary | ICD-10-CM | POA: Diagnosis not present

## 2019-07-19 DIAGNOSIS — Z992 Dependence on renal dialysis: Secondary | ICD-10-CM | POA: Diagnosis not present

## 2019-07-20 NOTE — Telephone Encounter (Signed)
I called pt and I spoke son [name] he states she's at [facility] rehab for the next 7 days days.

## 2019-07-21 ENCOUNTER — Other Ambulatory Visit: Payer: Self-pay

## 2019-07-21 DIAGNOSIS — A499 Bacterial infection, unspecified: Secondary | ICD-10-CM | POA: Diagnosis not present

## 2019-07-21 DIAGNOSIS — D689 Coagulation defect, unspecified: Secondary | ICD-10-CM | POA: Diagnosis not present

## 2019-07-21 DIAGNOSIS — Z992 Dependence on renal dialysis: Secondary | ICD-10-CM | POA: Diagnosis not present

## 2019-07-21 DIAGNOSIS — D631 Anemia in chronic kidney disease: Secondary | ICD-10-CM | POA: Diagnosis not present

## 2019-07-21 DIAGNOSIS — N186 End stage renal disease: Secondary | ICD-10-CM | POA: Diagnosis not present

## 2019-07-21 DIAGNOSIS — N184 Chronic kidney disease, stage 4 (severe): Secondary | ICD-10-CM

## 2019-07-21 DIAGNOSIS — R509 Fever, unspecified: Secondary | ICD-10-CM | POA: Diagnosis not present

## 2019-07-21 DIAGNOSIS — R519 Headache, unspecified: Secondary | ICD-10-CM | POA: Diagnosis not present

## 2019-07-21 DIAGNOSIS — R52 Pain, unspecified: Secondary | ICD-10-CM | POA: Diagnosis not present

## 2019-07-21 DIAGNOSIS — E1122 Type 2 diabetes mellitus with diabetic chronic kidney disease: Secondary | ICD-10-CM | POA: Diagnosis not present

## 2019-07-21 DIAGNOSIS — D509 Iron deficiency anemia, unspecified: Secondary | ICD-10-CM | POA: Diagnosis not present

## 2019-07-22 ENCOUNTER — Telehealth: Payer: Self-pay | Admitting: *Deleted

## 2019-07-22 ENCOUNTER — Ambulatory Visit (HOSPITAL_COMMUNITY)
Admission: RE | Admit: 2019-07-22 | Discharge: 2019-07-22 | Disposition: A | Discharge status: Home / Self Care | Payer: No Typology Code available for payment source | Source: Ambulatory Visit | Attending: Family | Admitting: Family

## 2019-07-22 ENCOUNTER — Ambulatory Visit (INDEPENDENT_AMBULATORY_CARE_PROVIDER_SITE_OTHER): Payer: Self-pay | Admitting: Physician Assistant

## 2019-07-22 ENCOUNTER — Encounter: Payer: Self-pay | Admitting: *Deleted

## 2019-07-22 ENCOUNTER — Other Ambulatory Visit: Payer: Self-pay

## 2019-07-22 DIAGNOSIS — N184 Chronic kidney disease, stage 4 (severe): Secondary | ICD-10-CM | POA: Insufficient documentation

## 2019-07-22 DIAGNOSIS — Z992 Dependence on renal dialysis: Secondary | ICD-10-CM

## 2019-07-22 DIAGNOSIS — N186 End stage renal disease: Secondary | ICD-10-CM

## 2019-07-22 NOTE — Progress Notes (Signed)
Spoke with Marney Doctor at [facility] to schedule procedure. Reviewed all procedure and nasal swab instructions with her for this patient. Verbalized understanding. Letter faxed to [facility] as requested.

## 2019-07-22 NOTE — Progress Notes (Signed)
    Postoperative Access Visit   History of Present Illness   PAOLA ALESHIRE is a 78 y.o. year old female who presents for postoperative follow-up for: left brachiocephalic arteriovenous fistula by Dr. Donnetta Hutching (Date: 06/08/19).  The patient's wounds are healed.  The patient denies steal symptoms.  The patient currently resides in a nursing facility.  She is dialyzing via [facility] on a MWF schedule.   Physical Examination   Vitals:   07/22/19 1118  BP: (!) 132/58  Pulse: 66  Temp: (!) 95.8 F (35.4 C)  TempSrc: Temporal  SpO2: 98%   There is no height or weight on file to calculate BMI.  left arm Incision is healed, palpable radial pulse, hand grip is 5/5, sensation in digits is intact, palpable thrill up to mid upper arm    Medical Decision Making   SHAMONE WINZER is a 78 y.o. year old female who presents s/p left brachiocephalic arteriovenous fistula   Patent brachiocephalic fistula without signs or symptoms of steal syndrome  Palpable thrill up to the mid arm which correlates with a stenosis and high velocity noted on fistula duplex in the mid arm  Plan is for L arm fistulogram with possible intervention  I also discussed the possible need for superficialization however patient is very much against any further surgery.  She is however agreeable to fistulogram.  This will be scheduled with the next available surgeon on a non-dialysis day   Dagoberto Ligas PA-C [facility] Office: [facility] MD: Donzetta Matters

## 2019-07-22 NOTE — Telephone Encounter (Signed)
I spoke with patient's son [name] made him aware he will need to consent for procedure on 08/12/2019 at [facility] He states he can give consent over the phone as he has before. Jetta in [facility] lab made aware of this at booking. Reviewed all pre-procedure/nasal swab  Instructions with Marney Doctor at [facility]. (see letter) as well as  faxed instruction to facility. Hilda Blades confirmed received fax. To call this office if questions.

## 2019-07-23 DIAGNOSIS — D689 Coagulation defect, unspecified: Secondary | ICD-10-CM | POA: Diagnosis not present

## 2019-07-23 DIAGNOSIS — R509 Fever, unspecified: Secondary | ICD-10-CM | POA: Diagnosis not present

## 2019-07-23 DIAGNOSIS — A499 Bacterial infection, unspecified: Secondary | ICD-10-CM | POA: Diagnosis not present

## 2019-07-23 DIAGNOSIS — R519 Headache, unspecified: Secondary | ICD-10-CM | POA: Diagnosis not present

## 2019-07-23 DIAGNOSIS — E1122 Type 2 diabetes mellitus with diabetic chronic kidney disease: Secondary | ICD-10-CM | POA: Diagnosis not present

## 2019-07-23 DIAGNOSIS — D631 Anemia in chronic kidney disease: Secondary | ICD-10-CM | POA: Diagnosis not present

## 2019-07-23 DIAGNOSIS — D509 Iron deficiency anemia, unspecified: Secondary | ICD-10-CM | POA: Diagnosis not present

## 2019-07-23 DIAGNOSIS — R52 Pain, unspecified: Secondary | ICD-10-CM | POA: Diagnosis not present

## 2019-07-23 DIAGNOSIS — N186 End stage renal disease: Secondary | ICD-10-CM | POA: Diagnosis not present

## 2019-07-23 DIAGNOSIS — Z992 Dependence on renal dialysis: Secondary | ICD-10-CM | POA: Diagnosis not present

## 2019-07-26 ENCOUNTER — Ambulatory Visit: Payer: Medicare Other | Admitting: Internal Medicine

## 2019-07-26 DIAGNOSIS — D631 Anemia in chronic kidney disease: Secondary | ICD-10-CM | POA: Diagnosis not present

## 2019-07-26 DIAGNOSIS — D509 Iron deficiency anemia, unspecified: Secondary | ICD-10-CM | POA: Diagnosis not present

## 2019-07-26 DIAGNOSIS — R52 Pain, unspecified: Secondary | ICD-10-CM | POA: Diagnosis not present

## 2019-07-26 DIAGNOSIS — N186 End stage renal disease: Secondary | ICD-10-CM | POA: Diagnosis not present

## 2019-07-26 DIAGNOSIS — A499 Bacterial infection, unspecified: Secondary | ICD-10-CM | POA: Diagnosis not present

## 2019-07-26 DIAGNOSIS — E1122 Type 2 diabetes mellitus with diabetic chronic kidney disease: Secondary | ICD-10-CM | POA: Diagnosis not present

## 2019-07-26 DIAGNOSIS — R519 Headache, unspecified: Secondary | ICD-10-CM | POA: Diagnosis not present

## 2019-07-26 DIAGNOSIS — D689 Coagulation defect, unspecified: Secondary | ICD-10-CM | POA: Diagnosis not present

## 2019-07-26 DIAGNOSIS — R509 Fever, unspecified: Secondary | ICD-10-CM | POA: Diagnosis not present

## 2019-07-26 DIAGNOSIS — Z992 Dependence on renal dialysis: Secondary | ICD-10-CM | POA: Diagnosis not present

## 2019-07-28 DIAGNOSIS — D631 Anemia in chronic kidney disease: Secondary | ICD-10-CM | POA: Diagnosis not present

## 2019-07-28 DIAGNOSIS — N186 End stage renal disease: Secondary | ICD-10-CM | POA: Diagnosis not present

## 2019-07-28 DIAGNOSIS — R509 Fever, unspecified: Secondary | ICD-10-CM | POA: Diagnosis not present

## 2019-07-28 DIAGNOSIS — D689 Coagulation defect, unspecified: Secondary | ICD-10-CM | POA: Diagnosis not present

## 2019-07-28 DIAGNOSIS — A499 Bacterial infection, unspecified: Secondary | ICD-10-CM | POA: Diagnosis not present

## 2019-07-28 DIAGNOSIS — Z03818 Encounter for observation for suspected exposure to other biological agents ruled out: Secondary | ICD-10-CM | POA: Diagnosis not present

## 2019-07-28 DIAGNOSIS — R519 Headache, unspecified: Secondary | ICD-10-CM | POA: Diagnosis not present

## 2019-07-28 DIAGNOSIS — R52 Pain, unspecified: Secondary | ICD-10-CM | POA: Diagnosis not present

## 2019-07-28 DIAGNOSIS — D509 Iron deficiency anemia, unspecified: Secondary | ICD-10-CM | POA: Diagnosis not present

## 2019-07-28 DIAGNOSIS — Z992 Dependence on renal dialysis: Secondary | ICD-10-CM | POA: Diagnosis not present

## 2019-07-28 DIAGNOSIS — E1122 Type 2 diabetes mellitus with diabetic chronic kidney disease: Secondary | ICD-10-CM | POA: Diagnosis not present

## 2019-07-31 DIAGNOSIS — D509 Iron deficiency anemia, unspecified: Secondary | ICD-10-CM | POA: Diagnosis not present

## 2019-07-31 DIAGNOSIS — D689 Coagulation defect, unspecified: Secondary | ICD-10-CM | POA: Diagnosis not present

## 2019-07-31 DIAGNOSIS — Z992 Dependence on renal dialysis: Secondary | ICD-10-CM | POA: Diagnosis not present

## 2019-07-31 DIAGNOSIS — E1122 Type 2 diabetes mellitus with diabetic chronic kidney disease: Secondary | ICD-10-CM | POA: Diagnosis not present

## 2019-07-31 DIAGNOSIS — R519 Headache, unspecified: Secondary | ICD-10-CM | POA: Diagnosis not present

## 2019-07-31 DIAGNOSIS — D631 Anemia in chronic kidney disease: Secondary | ICD-10-CM | POA: Diagnosis not present

## 2019-07-31 DIAGNOSIS — R509 Fever, unspecified: Secondary | ICD-10-CM | POA: Diagnosis not present

## 2019-07-31 DIAGNOSIS — N186 End stage renal disease: Secondary | ICD-10-CM | POA: Diagnosis not present

## 2019-07-31 DIAGNOSIS — A499 Bacterial infection, unspecified: Secondary | ICD-10-CM | POA: Diagnosis not present

## 2019-07-31 DIAGNOSIS — R52 Pain, unspecified: Secondary | ICD-10-CM | POA: Diagnosis not present

## 2019-08-01 DIAGNOSIS — Z03818 Encounter for observation for suspected exposure to other biological agents ruled out: Secondary | ICD-10-CM | POA: Diagnosis not present

## 2019-08-02 DIAGNOSIS — D689 Coagulation defect, unspecified: Secondary | ICD-10-CM | POA: Diagnosis not present

## 2019-08-02 DIAGNOSIS — D509 Iron deficiency anemia, unspecified: Secondary | ICD-10-CM | POA: Diagnosis not present

## 2019-08-02 DIAGNOSIS — U071 COVID-19: Secondary | ICD-10-CM | POA: Diagnosis not present

## 2019-08-02 DIAGNOSIS — D631 Anemia in chronic kidney disease: Secondary | ICD-10-CM | POA: Diagnosis not present

## 2019-08-02 DIAGNOSIS — R5381 Other malaise: Secondary | ICD-10-CM | POA: Diagnosis not present

## 2019-08-02 DIAGNOSIS — I5033 Acute on chronic diastolic (congestive) heart failure: Secondary | ICD-10-CM | POA: Diagnosis not present

## 2019-08-02 DIAGNOSIS — R52 Pain, unspecified: Secondary | ICD-10-CM | POA: Diagnosis not present

## 2019-08-02 DIAGNOSIS — Z992 Dependence on renal dialysis: Secondary | ICD-10-CM | POA: Diagnosis not present

## 2019-08-02 DIAGNOSIS — A4189 Other specified sepsis: Secondary | ICD-10-CM | POA: Diagnosis not present

## 2019-08-02 DIAGNOSIS — R509 Fever, unspecified: Secondary | ICD-10-CM | POA: Diagnosis not present

## 2019-08-02 DIAGNOSIS — N186 End stage renal disease: Secondary | ICD-10-CM | POA: Diagnosis not present

## 2019-08-02 DIAGNOSIS — A499 Bacterial infection, unspecified: Secondary | ICD-10-CM | POA: Diagnosis not present

## 2019-08-02 DIAGNOSIS — R519 Headache, unspecified: Secondary | ICD-10-CM | POA: Diagnosis not present

## 2019-08-02 DIAGNOSIS — E1122 Type 2 diabetes mellitus with diabetic chronic kidney disease: Secondary | ICD-10-CM | POA: Diagnosis not present

## 2019-08-03 ENCOUNTER — Telehealth: Payer: Self-pay | Admitting: *Deleted

## 2019-08-03 ENCOUNTER — Encounter: Payer: Medicare Other | Admitting: Family

## 2019-08-03 ENCOUNTER — Other Ambulatory Visit: Payer: Self-pay

## 2019-08-03 DIAGNOSIS — G3184 Mild cognitive impairment, so stated: Secondary | ICD-10-CM | POA: Diagnosis not present

## 2019-08-03 DIAGNOSIS — E039 Hypothyroidism, unspecified: Secondary | ICD-10-CM | POA: Diagnosis not present

## 2019-08-03 DIAGNOSIS — D649 Anemia, unspecified: Secondary | ICD-10-CM | POA: Diagnosis not present

## 2019-08-03 DIAGNOSIS — D519 Vitamin B12 deficiency anemia, unspecified: Secondary | ICD-10-CM | POA: Diagnosis not present

## 2019-08-03 DIAGNOSIS — N186 End stage renal disease: Secondary | ICD-10-CM | POA: Diagnosis not present

## 2019-08-03 NOTE — Telephone Encounter (Signed)
Taylor Tyler at [facility] patient tested Covid Positive 08/01/2019. Facility will call this office to re-schedule according to Covid re-scheduling guidelines.

## 2019-08-03 NOTE — Progress Notes (Signed)
Error

## 2019-08-03 NOTE — Progress Notes (Signed)
[facility] [phone] est. [phone]  NP unable to connect via phone with SNF staff for telephone visit.  NP requesting labs to be fax to [facility]. LPN. Unable to to leave voicemail with [facility]. Will attempt to call again

## 2019-08-04 DIAGNOSIS — D509 Iron deficiency anemia, unspecified: Secondary | ICD-10-CM | POA: Diagnosis not present

## 2019-08-04 DIAGNOSIS — R519 Headache, unspecified: Secondary | ICD-10-CM | POA: Diagnosis not present

## 2019-08-04 DIAGNOSIS — D631 Anemia in chronic kidney disease: Secondary | ICD-10-CM | POA: Diagnosis not present

## 2019-08-04 DIAGNOSIS — A499 Bacterial infection, unspecified: Secondary | ICD-10-CM | POA: Diagnosis not present

## 2019-08-04 DIAGNOSIS — E1122 Type 2 diabetes mellitus with diabetic chronic kidney disease: Secondary | ICD-10-CM | POA: Diagnosis not present

## 2019-08-04 DIAGNOSIS — R509 Fever, unspecified: Secondary | ICD-10-CM | POA: Diagnosis not present

## 2019-08-04 DIAGNOSIS — R52 Pain, unspecified: Secondary | ICD-10-CM | POA: Diagnosis not present

## 2019-08-04 DIAGNOSIS — D689 Coagulation defect, unspecified: Secondary | ICD-10-CM | POA: Diagnosis not present

## 2019-08-04 DIAGNOSIS — Z992 Dependence on renal dialysis: Secondary | ICD-10-CM | POA: Diagnosis not present

## 2019-08-04 DIAGNOSIS — N186 End stage renal disease: Secondary | ICD-10-CM | POA: Diagnosis not present

## 2019-08-05 DIAGNOSIS — E785 Hyperlipidemia, unspecified: Secondary | ICD-10-CM | POA: Diagnosis not present

## 2019-08-05 DIAGNOSIS — G3184 Mild cognitive impairment, so stated: Secondary | ICD-10-CM | POA: Diagnosis not present

## 2019-08-05 DIAGNOSIS — Z79899 Other long term (current) drug therapy: Secondary | ICD-10-CM | POA: Diagnosis not present

## 2019-08-07 ENCOUNTER — Encounter: Payer: Self-pay | Admitting: Emergency Medicine

## 2019-08-07 ENCOUNTER — Other Ambulatory Visit
Admission: RE | Admit: 2019-08-07 | Discharge: 2019-08-07 | Disposition: A | Discharge status: Home / Self Care | Payer: Medicare Other | Source: Ambulatory Visit | Attending: *Deleted | Admitting: *Deleted

## 2019-08-07 ENCOUNTER — Emergency Department: Payer: Medicare Other

## 2019-08-07 ENCOUNTER — Other Ambulatory Visit: Payer: Self-pay

## 2019-08-07 ENCOUNTER — Emergency Department
Admission: EM | Admit: 2019-08-07 | Discharge: 2019-08-08 | Disposition: A | Discharge status: Skilled Nursing Facility | Payer: Medicare Other | Attending: Emergency Medicine | Admitting: Emergency Medicine

## 2019-08-07 DIAGNOSIS — U071 COVID-19: Secondary | ICD-10-CM | POA: Insufficient documentation

## 2019-08-07 DIAGNOSIS — E1122 Type 2 diabetes mellitus with diabetic chronic kidney disease: Secondary | ICD-10-CM | POA: Diagnosis not present

## 2019-08-07 DIAGNOSIS — J811 Chronic pulmonary edema: Secondary | ICD-10-CM | POA: Diagnosis not present

## 2019-08-07 DIAGNOSIS — Z992 Dependence on renal dialysis: Secondary | ICD-10-CM | POA: Diagnosis not present

## 2019-08-07 DIAGNOSIS — N183 Chronic kidney disease, stage 3 unspecified: Secondary | ICD-10-CM | POA: Insufficient documentation

## 2019-08-07 DIAGNOSIS — I509 Heart failure, unspecified: Secondary | ICD-10-CM | POA: Diagnosis not present

## 2019-08-07 DIAGNOSIS — E039 Hypothyroidism, unspecified: Secondary | ICD-10-CM | POA: Diagnosis not present

## 2019-08-07 DIAGNOSIS — D631 Anemia in chronic kidney disease: Secondary | ICD-10-CM | POA: Diagnosis not present

## 2019-08-07 DIAGNOSIS — R05 Cough: Secondary | ICD-10-CM | POA: Diagnosis present

## 2019-08-07 DIAGNOSIS — N186 End stage renal disease: Secondary | ICD-10-CM | POA: Diagnosis not present

## 2019-08-07 DIAGNOSIS — D689 Coagulation defect, unspecified: Secondary | ICD-10-CM | POA: Diagnosis not present

## 2019-08-07 DIAGNOSIS — D509 Iron deficiency anemia, unspecified: Secondary | ICD-10-CM | POA: Diagnosis not present

## 2019-08-07 LAB — CBC WITH DIFFERENTIAL/PLATELET
Abs Immature Granulocytes: 0.03 K/uL (ref 0.00–0.07)
Basophils Absolute: 0 K/uL (ref 0.0–0.1)
Basophils Relative: 0 %
Eosinophils Absolute: 0 K/uL (ref 0.0–0.5)
Eosinophils Relative: 1 %
HCT: 23.6 % — ABNORMAL LOW (ref 36.0–46.0)
Hemoglobin: 7.5 g/dL — ABNORMAL LOW (ref 12.0–15.0)
Immature Granulocytes: 1 %
Lymphocytes Relative: 25 %
Lymphs Abs: 1.1 K/uL (ref 0.7–4.0)
MCH: 26.6 pg (ref 26.0–34.0)
MCHC: 31.8 g/dL (ref 30.0–36.0)
MCV: 83.7 fL (ref 80.0–100.0)
Monocytes Absolute: 0.6 K/uL (ref 0.1–1.0)
Monocytes Relative: 13 %
Neutro Abs: 2.6 K/uL (ref 1.7–7.7)
Neutrophils Relative %: 60 %
Platelets: 157 K/uL (ref 150–400)
RBC: 2.82 MIL/uL — ABNORMAL LOW (ref 3.87–5.11)
RDW: 18.6 % — ABNORMAL HIGH (ref 11.5–15.5)
WBC: 4.3 K/uL (ref 4.0–10.5)
nRBC: 0 % (ref 0.0–0.2)

## 2019-08-07 LAB — COMPREHENSIVE METABOLIC PANEL WITH GFR
ALT: <5 U/L (ref 0–44)
AST: 29 U/L (ref 15–41)
Albumin: 2.3 g/dL — ABNORMAL LOW (ref 3.5–5.0)
Alkaline Phosphatase: 59 U/L (ref 38–126)
Anion gap: 11 (ref 5–15)
BUN: 10 mg/dL (ref 8–23)
CO2: 24 mmol/L (ref 22–32)
Calcium: 8 mg/dL — ABNORMAL LOW (ref 8.9–10.3)
Chloride: 100 mmol/L (ref 98–111)
Creatinine, Ser: 2.96 mg/dL — ABNORMAL HIGH (ref 0.44–1.00)
GFR calc Af Amer: 17 mL/min — ABNORMAL LOW
GFR calc non Af Amer: 15 mL/min — ABNORMAL LOW
Glucose, Bld: 80 mg/dL (ref 70–99)
Potassium: 4.1 mmol/L (ref 3.5–5.1)
Sodium: 135 mmol/L (ref 135–145)
Total Bilirubin: 0.7 mg/dL (ref 0.3–1.2)
Total Protein: 6.3 g/dL — ABNORMAL LOW (ref 6.5–8.1)

## 2019-08-07 MED ORDER — IVERMECTIN 3 MG PO TABS
200.0000 ug/kg | ORAL_TABLET | Freq: Once | ORAL | Status: AC
  Administered 2019-08-07: 23:00:00 16500 ug via ORAL
  Filled 2019-08-07: qty 6

## 2019-08-07 MED ORDER — BENZONATATE 100 MG PO CAPS
200.0000 mg | ORAL_CAPSULE | Freq: Once | ORAL | Status: AC
  Administered 2019-08-07: 200 mg via ORAL
  Filled 2019-08-07: qty 2

## 2019-08-07 MED ORDER — PREDNISONE 20 MG PO TABS
50.0000 mg | ORAL_TABLET | Freq: Once | ORAL | Status: AC
  Administered 2019-08-07: 50 mg via ORAL
  Filled 2019-08-07: qty 2

## 2019-08-07 MED ORDER — PREDNISONE 50 MG PO TABS: ORAL_TABLET | ORAL | 0 refills | Status: DC

## 2019-08-07 MED ORDER — IVERMECTIN 3 MG PO TABS: 200.0000 ug/kg | ORAL_TABLET | Freq: Once | ORAL | 0 refills | Status: AC

## 2019-08-07 MED ORDER — BENZONATATE 100 MG PO CAPS: 100.0000 mg | ORAL_CAPSULE | Freq: Three times a day (TID) | ORAL | 0 refills | Status: DC | PRN

## 2019-08-07 NOTE — ED Provider Notes (Addendum)
[facility] Emergency Department Provider Note       Time seen: ----------------------------------------- 10:15 PM on 08/07/2019 ----------------------------------------- I have reviewed the triage vital signs and the nursing notes.  HISTORY   Chief Complaint No chief complaint on file.    HPI Taylor Tyler is a 79 y.o. female with a history of anemia, renal failure, arthritis, chronic kidney disease, diabetes, hypertension who presents to the ED for coronavirus symptoms.  Reportedly she tested positive for Covid last Tuesday.  There was concerns about hypoxemia at her facility but she has been normoxic with EMS and here.  She is complaining for cough and weakness but no other complaints.  Past Medical History:  Diagnosis Date  . Acute blood loss anemia 03/14/2017  . Acute renal failure superimposed on stage 3 chronic kidney disease ([place]) 03/14/2017  . AKI (acute kidney injury) ([place]) 02/28/2017  . Altered mental status 03/29/2017  . Anemia   . Aortic atherosclerosis ([place])   . Arthritis    "joints might ache at times; not that bad" (06/10/2018)  . Bilateral lower extremity edema 03/04/2017  . Bradycardia   . Chronic kidney disease    ?? renal insufficiency,   . CKD (chronic kidney disease) stage 3, GFR 30-59 ml/min 01/15/2017   ?? renal insufficiency, which she thinks is coming from "all these medications"  . Diabetes mellitus without complication ([place])    diagnosed 4-5 yrs ago, 06/21/18- "that was years ago"  . Disease of pancreas   . Diverticulitis    s/p perforation and partial colectomy 01/27/14 with 3 benign lymph nodes   . Diverticulosis 01/15/2017  . DJD (degenerative joint disease)   . DM (diabetes mellitus), type 2 with renal complications ([place]) 11/17/6061  . Fatty liver   . Hypertension   . Hypothyroidism    "had radiation" (06/10/2018)  . Kidney stone   . Lethargy 02/28/2017  . Obesity, Class III, BMI 40-49.9 (morbid obesity) ([place]) 02/27/2017  .  Pleural lipoma   . Postoperative wound infection 04/02/2017  . Spinal stenosis of lumbar region 01/15/2017  . Status post lumbar surgery   . Wound healing, delayed    back    Patient Active Problem List   Diagnosis Date Noted  . ESRD on dialysis ([place]) 07/22/2019  . Fever   . Pressure injury of skin 06/08/2019  . Acute CHF (congestive heart failure) ([place]) 05/30/2019  . Infection and inflammatory reaction due to internal fixation device of spine, initial encounter ([place]) 06/05/2018  . Neuropathy 01/08/2018  . Fatty liver 01/05/2018  . Chronic pain of both knees 01/05/2018  . Ventral hernia 01/05/2018  . CKD (chronic kidney disease) stage 4, GFR 15-29 ml/min (H[facility]) 07/21/2017  . Vitamin D deficiency 07/21/2017  . Essential hypertension   . Postoperative wound infection 04/02/2017  . Anemia 03/14/2017  . Acute blood loss anemia 03/14/2017  . Acute renal failure superimposed on stage 3 chronic kidney disease ([place]) 03/14/2017  . Bilateral leg edema 03/04/2017  . Obesity, Class III, BMI 40-49.9 (morbid obesity) ([place]) 02/27/2017  . DM (diabetes mellitus), type 2 with renal complications ([place]) 01/60/1093  . Status post lumbar surgery 02/24/2017  . CKD (chronic kidney disease) stage 3, GFR 30-59 ml/min 01/15/2017  . Hypothyroidism 01/15/2017  . Diverticulosis 01/15/2017  . HLD (hyperlipidemia) 01/15/2017  . Spinal stenosis of lumbar region 01/15/2017  . Disease of pancreas 07/21/2012    Past Surgical History:  Procedure Laterality Date  . ABDOMINAL EXPOSURE N/A 02/24/2017   Procedure: ABDOMINAL  EXPOSURE;  Surgeon: Angelia Mould, MD;  Location: [place];  Service: Vascular;  Laterality: N/A;  . ABDOMINAL HYSTERECTOMY  1987   no h/o abnormal paps   . ANTERIOR LAT LUMBAR FUSION N/A 02/24/2017   Procedure: Lumbar three- five Anterior lateral lumbar interbody fusion;  Surgeon: Ditty, Kevan Ny, MD;  Location: [place];  Service: Neurosurgery;  Laterality: N/A;  L3-5 Anterior lateral  lumbar interbody fusion with removal of coflex at L3-4, L4-5  . ANTERIOR LUMBAR FUSION N/A 02/24/2017   Procedure: Stage 1: Lumbar five-Sacral one Anterior lumbar interbody fusion;  Surgeon: Ditty, Kevan Ny, MD;  Location: [place];  Service: Neurosurgery;  Laterality: N/A;  Stage 1: L5-S1 Anterior lumbar interbody fusion  . APPENDECTOMY    . APPLICATION OF ROBOTIC ASSISTANCE FOR SPINAL PROCEDURE N/A 02/26/2017   Procedure: APPLICATION OF ROBOTIC ASSISTANCE FOR SPINAL PROCEDURE;  Surgeon: Ditty, Kevan Ny, MD;  Location: [place];  Service: Neurosurgery;  Laterality: N/A;  . APPLICATION OF WOUND VAC N/A 03/30/2017   Procedure: APPLICATION OF WOUND VAC;  Surgeon: Ditty, Kevan Ny, MD;  Location: [place];  Service: Neurosurgery;  Laterality: N/A;  . APPLICATION OF WOUND VAC N/A 06/23/2019   Procedure: APPLICATION OF WOUND VAC;  Surgeon: Consuella Lose, MD;  Location: [place];  Service: Neurosurgery;  Laterality: N/A;  . AV FISTULA PLACEMENT Left 06/08/2019   Procedure: Arteriovenous (Av) Fistula Creation;  Surgeon: Rosetta Posner, MD;  Location: [place];  Service: Vascular;  Laterality: Left;  . BACK SURGERY  2013  . CATARACT EXTRACTION W/ INTRAOCULAR LENS IMPLANT Right   . CHOLECYSTECTOMY OPEN  1982  . COLON SURGERY  01/26/2014   desc.sigmoid colectomy and ventral hernia repair and splenic flexure mobilization  . COLON SURGERY    . DILATION AND CURETTAGE OF UTERUS    . HERNIA REPAIR    . INSERTION OF DIALYSIS CATHETER Right 06/08/2019   Procedure: INSERTION OF DIALYSIS CATHETER RIGHT INTERNAL JUGULAR;  Surgeon: Rosetta Posner, MD;  Location: [place];  Service: Vascular;  Laterality: Right;  . LUMBAR LAMINECTOMY WITH SPINOUS PROCESS PLATE 2 LEVEL N/A 2/37/6283   Procedure: LUMBAR LAMINECTOMY/DECOMPRESSION MICRODISCECTOMY CoFlex;  Surgeon: Faythe Ghee, MD;  Location: [facility];  Service: Neurosurgery;  Laterality: N/A;  Lumbar three-four,Lumbar Four-Five Laminectomy with Coflex  . LUMBAR  WOUND DEBRIDEMENT N/A 03/30/2017   Procedure: Lumbar wound exploration/debridement, placement of wound vac;  Surgeon: Ditty, Kevan Ny, MD;  Location: [place];  Service: Neurosurgery;  Laterality: N/A;  Lumbar wound exploration/debridement, placement of wound vac  . LUMBAR WOUND DEBRIDEMENT N/A 06/06/2018   Procedure: LUMBAR WOUND DEBRIDEMENT/EXPLORATION;  Surgeon: Consuella Lose, MD;  Location: [place];  Service: Neurosurgery;  Laterality: N/A;  . LUMBAR WOUND DEBRIDEMENT N/A 06/22/2018   Procedure: SIMPLE INCISION AND DRAINAGE OF WOUND, APPLICATION OF WOUND VAC;  Surgeon: Consuella Lose, MD;  Location: [place];  Service: Neurosurgery;  Laterality: N/A;  . LUMBAR WOUND DEBRIDEMENT N/A 06/23/2019   Procedure: LUMBAR WOUND DEBRIDEMENT WITH HARDWARE REMOVAL;  Surgeon: Consuella Lose, MD;  Location: [place];  Service: Neurosurgery;  Laterality: N/A;  . PARTIAL COLECTOMY     01/27/14 diverticulitis and 3 benign lymph nodes [facility] Dr. Pat Patrick   . REDUCTION MAMMAPLASTY Bilateral 1992  . THROMBECTOMY BRACHIAL ARTERY Left 06/08/2019   Procedure: Thrombectomy Brachial Artery;  Surgeon: Rosetta Posner, MD;  Location: [place];  Service: Vascular;  Laterality: Left;  . TUBAL LIGATION    . VENTRAL HERNIA REPAIR  2014  Allergies Clams [shellfish allergy], Hydralazine, Norvasc [amlodipine besylate], and Milk-related compounds  Social History Social History   Tobacco Use  . Smoking status: Never Smoker  . Smokeless tobacco: Never Used  Substance Use Topics  . Alcohol use: Not Currently    Comment: "nothing since age 32" (06/10/2018)  . Drug use: Never    Review of Systems Constitutional: Positive for fever Cardiovascular: Negative for chest pain. Respiratory: Positive for cough Gastrointestinal: Negative for abdominal pain, vomiting and diarrhea. Musculoskeletal: Negative for back pain. Skin: Negative for rash. Neurological: Negative for headaches, focal weakness or numbness.  All systems  negative/normal/unremarkable except as stated in the HPI  ____________________________________________   PHYSICAL EXAM:  VITAL SIGNS: ED Triage Vitals  Enc Vitals Group     BP      Pulse      Resp      Temp      Temp src      SpO2      Weight      Height      Head Circumference      Peak Flow      Pain Score      Pain Loc      Pain Edu?      Excl. in [place]?    Constitutional: Alert and oriented. Well appearing and in no distress. Eyes: Conjunctivae are normal. Normal extraocular movements. Cardiovascular: Normal rate, regular rhythm. No murmurs, rubs, or gallops. Respiratory: Normal respiratory effort without tachypnea nor retractions. Breath sounds are clear and equal bilaterally. No wheezes/rales/rhonchi. Gastrointestinal: Soft and nontender. Normal bowel sounds Musculoskeletal: Nontender with normal range of motion in extremities. No lower extremity tenderness nor edema. Neurologic:  Normal speech and language. No gross focal neurologic deficits are appreciated.  Skin:  Skin is warm, dry and intact. No rash noted. Psychiatric: Mood and affect are normal. Speech and behavior are normal.  ____________________________________________  ED COURSE:  As part of my medical decision making, I reviewed the following data within the [place] History obtained from family if available, nursing notes, old chart and ekg, as well as notes from prior ED visits. Patient presented for COVID-19 symptoms, we will assess with labs and imaging as indicated at this time.   Procedures  SHARIFA BUCHOLZ was evaluated in Emergency Department on 08/07/2019 for the symptoms described in the history of present illness. She was evaluated in the context of the global COVID-19 pandemic, which necessitated consideration that the patient might be at risk for infection with the SARS-CoV-2 virus that causes COVID-19. Institutional protocols and algorithms that pertain to the evaluation of patients  at risk for COVID-19 are in a state of rapid change based on information released by regulatory bodies including the CDC and federal and state organizations. These policies and algorithms were followed during the patient's care in the ED.  ____________________________________________   LABS (pertinent positives/negatives)  Labs Reviewed  COMPREHENSIVE METABOLIC PANEL - Abnormal; Notable for the following components:      Result Value   Creatinine, Ser 2.96 (*)    Calcium 8.0 (*)    Total Protein 6.3 (*)    Albumin 2.3 (*)    GFR calc non Af Amer 15 (*)    GFR calc Af Amer 17 (*)    All other components within normal limits  CBC WITH DIFFERENTIAL/PLATELET  CBC WITH DIFFERENTIAL/PLATELET    RADIOLOGY  Chest x-ray Is still pending at this time ____________________________________________   DIFFERENTIAL DIAGNOSIS   COVID-19, pneumonia,  PE, dehydration, electrolyte abnormality  FINAL ASSESSMENT AND PLAN  COVID-19   Plan: The patient had presented for COVID-19 symptoms. Patient's labs and imaging are grossly unremarkable with known end-stage renal disease on dialysis.  She is in no distress, we will supply a short course of steroids, cough medicine.   Laurence Aly, MD    Note: This note was generated in part or whole with voice recognition software. Voice recognition is usually quite accurate but there are transcription errors that can and very often do occur. I apologize for any typographical errors that were not detected and corrected.     Earleen Newport, MD 08/07/19 2225    Earleen Newport, MD 08/07/19 [phone]

## 2019-08-07 NOTE — ED Triage Notes (Signed)
Pt via EMS from [facility]. Pt states she has no complaints other than a cough. Pt is COVID positive.

## 2019-08-08 DIAGNOSIS — U071 COVID-19: Secondary | ICD-10-CM | POA: Diagnosis not present

## 2019-08-08 DIAGNOSIS — R0902 Hypoxemia: Secondary | ICD-10-CM | POA: Diagnosis not present

## 2019-08-08 DIAGNOSIS — N186 End stage renal disease: Secondary | ICD-10-CM | POA: Diagnosis not present

## 2019-08-08 NOTE — ED Notes (Signed)
EMS transported patient back to facility via stretcher. Discharge paperwork given to EMS personnel.

## 2019-08-08 NOTE — ED Notes (Signed)
RN attempted to call report to [facility]. Placed on hold for 12 minutes-Will attempt to call back.

## 2019-08-08 NOTE — ED Notes (Signed)
[name] Melick-patient's son updated on transport back to facility as well as therapies and medications given.

## 2019-08-09 ENCOUNTER — Other Ambulatory Visit: Payer: Medicare Other

## 2019-08-09 DIAGNOSIS — D509 Iron deficiency anemia, unspecified: Secondary | ICD-10-CM | POA: Diagnosis not present

## 2019-08-09 DIAGNOSIS — D689 Coagulation defect, unspecified: Secondary | ICD-10-CM | POA: Diagnosis not present

## 2019-08-09 DIAGNOSIS — U071 COVID-19: Secondary | ICD-10-CM | POA: Diagnosis not present

## 2019-08-09 DIAGNOSIS — Z992 Dependence on renal dialysis: Secondary | ICD-10-CM | POA: Diagnosis not present

## 2019-08-09 DIAGNOSIS — I10 Essential (primary) hypertension: Secondary | ICD-10-CM | POA: Diagnosis not present

## 2019-08-09 DIAGNOSIS — I5032 Chronic diastolic (congestive) heart failure: Secondary | ICD-10-CM | POA: Diagnosis not present

## 2019-08-09 DIAGNOSIS — D631 Anemia in chronic kidney disease: Secondary | ICD-10-CM | POA: Diagnosis not present

## 2019-08-09 DIAGNOSIS — N186 End stage renal disease: Secondary | ICD-10-CM | POA: Diagnosis not present

## 2019-08-11 DIAGNOSIS — D509 Iron deficiency anemia, unspecified: Secondary | ICD-10-CM | POA: Diagnosis not present

## 2019-08-11 DIAGNOSIS — D631 Anemia in chronic kidney disease: Secondary | ICD-10-CM | POA: Diagnosis not present

## 2019-08-11 DIAGNOSIS — D689 Coagulation defect, unspecified: Secondary | ICD-10-CM | POA: Diagnosis not present

## 2019-08-11 DIAGNOSIS — N186 End stage renal disease: Secondary | ICD-10-CM | POA: Diagnosis not present

## 2019-08-11 DIAGNOSIS — Z992 Dependence on renal dialysis: Secondary | ICD-10-CM | POA: Diagnosis not present

## 2019-08-12 ENCOUNTER — Encounter (HOSPITAL_COMMUNITY): Payer: Self-pay

## 2019-08-12 ENCOUNTER — Ambulatory Visit (HOSPITAL_COMMUNITY): Admit: 2019-08-12 | Payer: Medicare Other | Admitting: Vascular Surgery

## 2019-08-12 SURGERY — A/V FISTULAGRAM
Anesthesia: LOCAL | Laterality: Left

## 2019-08-13 DIAGNOSIS — N186 End stage renal disease: Secondary | ICD-10-CM | POA: Diagnosis not present

## 2019-08-13 DIAGNOSIS — D689 Coagulation defect, unspecified: Secondary | ICD-10-CM | POA: Diagnosis not present

## 2019-08-13 DIAGNOSIS — D509 Iron deficiency anemia, unspecified: Secondary | ICD-10-CM | POA: Diagnosis not present

## 2019-08-13 DIAGNOSIS — D631 Anemia in chronic kidney disease: Secondary | ICD-10-CM | POA: Diagnosis not present

## 2019-08-13 DIAGNOSIS — Z992 Dependence on renal dialysis: Secondary | ICD-10-CM | POA: Diagnosis not present

## 2019-08-15 DIAGNOSIS — I10 Essential (primary) hypertension: Secondary | ICD-10-CM | POA: Diagnosis not present

## 2019-08-15 DIAGNOSIS — U071 COVID-19: Secondary | ICD-10-CM | POA: Diagnosis not present

## 2019-08-15 DIAGNOSIS — E039 Hypothyroidism, unspecified: Secondary | ICD-10-CM | POA: Diagnosis not present

## 2019-08-16 DIAGNOSIS — N186 End stage renal disease: Secondary | ICD-10-CM | POA: Diagnosis not present

## 2019-08-16 DIAGNOSIS — D509 Iron deficiency anemia, unspecified: Secondary | ICD-10-CM | POA: Diagnosis not present

## 2019-08-16 DIAGNOSIS — D689 Coagulation defect, unspecified: Secondary | ICD-10-CM | POA: Diagnosis not present

## 2019-08-16 DIAGNOSIS — D631 Anemia in chronic kidney disease: Secondary | ICD-10-CM | POA: Diagnosis not present

## 2019-08-16 DIAGNOSIS — Z992 Dependence on renal dialysis: Secondary | ICD-10-CM | POA: Diagnosis not present

## 2019-08-18 DIAGNOSIS — Z992 Dependence on renal dialysis: Secondary | ICD-10-CM | POA: Diagnosis not present

## 2019-08-18 DIAGNOSIS — D689 Coagulation defect, unspecified: Secondary | ICD-10-CM | POA: Diagnosis not present

## 2019-08-18 DIAGNOSIS — D631 Anemia in chronic kidney disease: Secondary | ICD-10-CM | POA: Diagnosis not present

## 2019-08-18 DIAGNOSIS — N186 End stage renal disease: Secondary | ICD-10-CM | POA: Diagnosis not present

## 2019-08-18 DIAGNOSIS — D509 Iron deficiency anemia, unspecified: Secondary | ICD-10-CM | POA: Diagnosis not present

## 2019-08-20 DIAGNOSIS — D631 Anemia in chronic kidney disease: Secondary | ICD-10-CM | POA: Diagnosis not present

## 2019-08-20 DIAGNOSIS — Z992 Dependence on renal dialysis: Secondary | ICD-10-CM | POA: Diagnosis not present

## 2019-08-20 DIAGNOSIS — D689 Coagulation defect, unspecified: Secondary | ICD-10-CM | POA: Diagnosis not present

## 2019-08-20 DIAGNOSIS — N186 End stage renal disease: Secondary | ICD-10-CM | POA: Diagnosis not present

## 2019-08-20 DIAGNOSIS — D509 Iron deficiency anemia, unspecified: Secondary | ICD-10-CM | POA: Diagnosis not present

## 2019-08-23 DIAGNOSIS — M869 Osteomyelitis, unspecified: Secondary | ICD-10-CM | POA: Diagnosis not present

## 2019-08-23 DIAGNOSIS — Z992 Dependence on renal dialysis: Secondary | ICD-10-CM | POA: Diagnosis not present

## 2019-08-23 DIAGNOSIS — L89302 Pressure ulcer of unspecified buttock, stage 2: Secondary | ICD-10-CM | POA: Diagnosis not present

## 2019-08-23 DIAGNOSIS — D689 Coagulation defect, unspecified: Secondary | ICD-10-CM | POA: Diagnosis not present

## 2019-08-23 DIAGNOSIS — R0602 Shortness of breath: Secondary | ICD-10-CM | POA: Diagnosis not present

## 2019-08-23 DIAGNOSIS — N186 End stage renal disease: Secondary | ICD-10-CM | POA: Diagnosis not present

## 2019-08-23 DIAGNOSIS — D509 Iron deficiency anemia, unspecified: Secondary | ICD-10-CM | POA: Diagnosis not present

## 2019-08-23 DIAGNOSIS — D631 Anemia in chronic kidney disease: Secondary | ICD-10-CM | POA: Diagnosis not present

## 2019-08-23 DIAGNOSIS — S70311A Abrasion, right thigh, initial encounter: Secondary | ICD-10-CM | POA: Diagnosis not present

## 2019-08-24 DIAGNOSIS — R6889 Other general symptoms and signs: Secondary | ICD-10-CM | POA: Diagnosis not present

## 2019-08-25 DIAGNOSIS — D509 Iron deficiency anemia, unspecified: Secondary | ICD-10-CM | POA: Diagnosis not present

## 2019-08-25 DIAGNOSIS — R0602 Shortness of breath: Secondary | ICD-10-CM | POA: Diagnosis not present

## 2019-08-25 DIAGNOSIS — D689 Coagulation defect, unspecified: Secondary | ICD-10-CM | POA: Diagnosis not present

## 2019-08-25 DIAGNOSIS — D631 Anemia in chronic kidney disease: Secondary | ICD-10-CM | POA: Diagnosis not present

## 2019-08-25 DIAGNOSIS — N186 End stage renal disease: Secondary | ICD-10-CM | POA: Diagnosis not present

## 2019-08-25 DIAGNOSIS — Z992 Dependence on renal dialysis: Secondary | ICD-10-CM | POA: Diagnosis not present

## 2019-08-27 DIAGNOSIS — D509 Iron deficiency anemia, unspecified: Secondary | ICD-10-CM | POA: Diagnosis not present

## 2019-08-27 DIAGNOSIS — D631 Anemia in chronic kidney disease: Secondary | ICD-10-CM | POA: Diagnosis not present

## 2019-08-27 DIAGNOSIS — D689 Coagulation defect, unspecified: Secondary | ICD-10-CM | POA: Diagnosis not present

## 2019-08-27 DIAGNOSIS — R0602 Shortness of breath: Secondary | ICD-10-CM | POA: Diagnosis not present

## 2019-08-27 DIAGNOSIS — Z992 Dependence on renal dialysis: Secondary | ICD-10-CM | POA: Diagnosis not present

## 2019-08-27 DIAGNOSIS — N186 End stage renal disease: Secondary | ICD-10-CM | POA: Diagnosis not present

## 2019-08-30 DIAGNOSIS — Z992 Dependence on renal dialysis: Secondary | ICD-10-CM | POA: Diagnosis not present

## 2019-08-30 DIAGNOSIS — N186 End stage renal disease: Secondary | ICD-10-CM | POA: Diagnosis not present

## 2019-08-30 DIAGNOSIS — R0602 Shortness of breath: Secondary | ICD-10-CM | POA: Diagnosis not present

## 2019-08-30 DIAGNOSIS — D509 Iron deficiency anemia, unspecified: Secondary | ICD-10-CM | POA: Diagnosis not present

## 2019-08-30 DIAGNOSIS — D689 Coagulation defect, unspecified: Secondary | ICD-10-CM | POA: Diagnosis not present

## 2019-08-30 DIAGNOSIS — D631 Anemia in chronic kidney disease: Secondary | ICD-10-CM | POA: Diagnosis not present

## 2019-08-31 DIAGNOSIS — E611 Iron deficiency: Secondary | ICD-10-CM | POA: Diagnosis not present

## 2019-08-31 DIAGNOSIS — Z131 Encounter for screening for diabetes mellitus: Secondary | ICD-10-CM | POA: Diagnosis not present

## 2019-08-31 DIAGNOSIS — E559 Vitamin D deficiency, unspecified: Secondary | ICD-10-CM | POA: Diagnosis not present

## 2019-08-31 DIAGNOSIS — E538 Deficiency of other specified B group vitamins: Secondary | ICD-10-CM | POA: Diagnosis not present

## 2019-08-31 DIAGNOSIS — R413 Other amnesia: Secondary | ICD-10-CM | POA: Diagnosis not present

## 2019-08-31 DIAGNOSIS — E519 Thiamine deficiency, unspecified: Secondary | ICD-10-CM | POA: Diagnosis not present

## 2019-09-01 ENCOUNTER — Other Ambulatory Visit: Payer: Self-pay | Admitting: Acute Care

## 2019-09-01 DIAGNOSIS — R413 Other amnesia: Secondary | ICD-10-CM

## 2019-09-01 DIAGNOSIS — D631 Anemia in chronic kidney disease: Secondary | ICD-10-CM | POA: Diagnosis not present

## 2019-09-01 DIAGNOSIS — Z992 Dependence on renal dialysis: Secondary | ICD-10-CM | POA: Diagnosis not present

## 2019-09-01 DIAGNOSIS — D689 Coagulation defect, unspecified: Secondary | ICD-10-CM | POA: Diagnosis not present

## 2019-09-01 DIAGNOSIS — N186 End stage renal disease: Secondary | ICD-10-CM | POA: Diagnosis not present

## 2019-09-01 DIAGNOSIS — R0602 Shortness of breath: Secondary | ICD-10-CM | POA: Diagnosis not present

## 2019-09-01 DIAGNOSIS — D509 Iron deficiency anemia, unspecified: Secondary | ICD-10-CM | POA: Diagnosis not present

## 2019-09-03 DIAGNOSIS — D631 Anemia in chronic kidney disease: Secondary | ICD-10-CM | POA: Diagnosis not present

## 2019-09-03 DIAGNOSIS — D689 Coagulation defect, unspecified: Secondary | ICD-10-CM | POA: Diagnosis not present

## 2019-09-03 DIAGNOSIS — D509 Iron deficiency anemia, unspecified: Secondary | ICD-10-CM | POA: Diagnosis not present

## 2019-09-03 DIAGNOSIS — Z992 Dependence on renal dialysis: Secondary | ICD-10-CM | POA: Diagnosis not present

## 2019-09-03 DIAGNOSIS — R509 Fever, unspecified: Secondary | ICD-10-CM | POA: Diagnosis not present

## 2019-09-03 DIAGNOSIS — N186 End stage renal disease: Secondary | ICD-10-CM | POA: Diagnosis not present

## 2019-09-04 DIAGNOSIS — N186 End stage renal disease: Secondary | ICD-10-CM | POA: Diagnosis not present

## 2019-09-04 DIAGNOSIS — E1122 Type 2 diabetes mellitus with diabetic chronic kidney disease: Secondary | ICD-10-CM | POA: Diagnosis not present

## 2019-09-04 DIAGNOSIS — Z992 Dependence on renal dialysis: Secondary | ICD-10-CM | POA: Diagnosis not present

## 2019-09-05 ENCOUNTER — Other Ambulatory Visit: Payer: Self-pay

## 2019-09-05 ENCOUNTER — Inpatient Hospital Stay: Payer: Medicare Other | Attending: Oncology | Admitting: Oncology

## 2019-09-05 ENCOUNTER — Inpatient Hospital Stay: Payer: Medicare Other

## 2019-09-05 ENCOUNTER — Encounter: Payer: Self-pay | Admitting: Oncology

## 2019-09-05 VITALS — BP 142/50 | HR 63 | Temp 98.6°F | Resp 18 | Wt 204.9 lb

## 2019-09-05 DIAGNOSIS — D649 Anemia, unspecified: Secondary | ICD-10-CM | POA: Diagnosis not present

## 2019-09-05 DIAGNOSIS — F0390 Unspecified dementia without behavioral disturbance: Secondary | ICD-10-CM | POA: Diagnosis not present

## 2019-09-05 DIAGNOSIS — Z992 Dependence on renal dialysis: Secondary | ICD-10-CM | POA: Diagnosis not present

## 2019-09-05 DIAGNOSIS — I120 Hypertensive chronic kidney disease with stage 5 chronic kidney disease or end stage renal disease: Secondary | ICD-10-CM

## 2019-09-05 DIAGNOSIS — R7989 Other specified abnormal findings of blood chemistry: Secondary | ICD-10-CM

## 2019-09-05 DIAGNOSIS — N186 End stage renal disease: Secondary | ICD-10-CM | POA: Diagnosis not present

## 2019-09-05 DIAGNOSIS — E1122 Type 2 diabetes mellitus with diabetic chronic kidney disease: Secondary | ICD-10-CM | POA: Diagnosis not present

## 2019-09-05 DIAGNOSIS — Z993 Dependence on wheelchair: Secondary | ICD-10-CM | POA: Diagnosis not present

## 2019-09-05 LAB — VITAMIN B12: Vitamin B-12: 536 pg/mL (ref 180–914)

## 2019-09-06 ENCOUNTER — Encounter: Payer: Self-pay | Admitting: Oncology

## 2019-09-06 LAB — HAPTOGLOBIN: Haptoglobin: 149 mg/dL (ref 42–346)

## 2019-09-06 LAB — KAPPA/LAMBDA LIGHT CHAINS
Kappa free light chain: 260.5 mg/L — ABNORMAL HIGH (ref 3.3–19.4)
Kappa, lambda light chain ratio: 2.18 — ABNORMAL HIGH (ref 0.26–1.65)
Lambda free light chains: 119.4 mg/L — ABNORMAL HIGH (ref 5.7–26.3)

## 2019-09-06 NOTE — Progress Notes (Signed)
Hematology/Oncology Consult note [facility] Telephone:([phone] [phone]  Patient Care Team: McLean-Scocuzza, Nino Glow, MD as PCP - General (Internal Medicine) Ditty, Kevan Ny, MD as Consulting Physician (Neurosurgery)   Name of the patient: Taylor Tyler  [id number]  July 27, 1941    Reason for referral-elevated ferritin   Referring physician- Dr. Kristine Linea  Date of visit: 09/06/19   History of presenting illness- With a past medical history significant for hypertension, chronic kidney disease, hypertension and diabetes as well as ongoing memory loss.  She is here alone and reports that she is at home and can cook her own food when in fact she is a resident of [facility].  I did speak to her son over the phone today since patient cannot give any history at this time.  She has been referred to me for elevated ferritin.  Her most recent ferritin on 08/31/2019 was greater than 1500.  Patient also was recently admitted to the hospital in December 2020 and was found to have a white count of 9.4, H&H of 8.8/29.4 and a platelet count of 145.  Patient son reports that she sees [facility] she has been getting 10,000 units of EPO with dialysis 3 times a week.  Patient reports some fatigue but denies other complaints  ECOG PS- 2  Pain scale- 0   Review of systems- Review of Systems  Constitutional: Positive for malaise/fatigue. Negative for chills, fever and weight loss.  HENT: Negative for congestion, ear discharge and nosebleeds.   Eyes: Negative for blurred vision.  Respiratory: Negative for cough, hemoptysis, sputum production, shortness of breath and wheezing.   Cardiovascular: Negative for chest pain, palpitations, orthopnea and claudication.  Gastrointestinal: Negative for abdominal pain, blood in stool, constipation, diarrhea, heartburn, melena, nausea and vomiting.  Genitourinary: Negative for dysuria, flank pain,  frequency, hematuria and urgency.  Musculoskeletal: Negative for back pain, joint pain and myalgias.  Skin: Negative for rash.  Neurological: Negative for dizziness, tingling, focal weakness, seizures, weakness and headaches.  Endo/Heme/Allergies: Does not bruise/bleed easily.  Psychiatric/Behavioral: Negative for depression and suicidal ideas. The patient does not have insomnia.     Allergies  Allergen Reactions  . Clams [Shellfish Allergy] Swelling and Other (See Comments)    THROAT SWELLS NECK TURNS RED  . Hydralazine Other (See Comments)    CHEST TIGHTNESS Patient has tolerated multiple doses of hydralazine since allergy was listed   . Norvasc [Amlodipine Besylate] Swelling    Leg edema   . Milk-Related Compounds Diarrhea    Patient Active Problem List   Diagnosis Date Noted  . ESRD on dialysis ([place]) 07/22/2019  . Fever   . Pressure injury of skin 06/08/2019  . Acute CHF (congestive heart failure) ([place]) 05/30/2019  . Infection and inflammatory reaction due to internal fixation device of spine, initial encounter ([place]) 06/05/2018  . Neuropathy 01/08/2018  . Fatty liver 01/05/2018  . Chronic pain of both knees 01/05/2018  . Ventral hernia 01/05/2018  . CKD (chronic kidney disease) stage 4, GFR 15-29 ml/min (HCC) 07/21/2017  . Vitamin D deficiency 07/21/2017  . Essential hypertension   . Postoperative wound infection 04/02/2017  . Anemia 03/14/2017  . Acute blood loss anemia 03/14/2017  . Acute renal failure superimposed on stage 3 chronic kidney disease ([place]) 03/14/2017  . Bilateral leg edema 03/04/2017  . Obesity, Class III, BMI 40-49.9 (morbid obesity) ([place]) 02/27/2017  . DM (diabetes mellitus), type 2 with renal complications ([place]) 85/09/7739  . Status post  lumbar surgery 02/24/2017  . CKD (chronic kidney disease) stage 3, GFR 30-59 ml/min 01/15/2017  . Hypothyroidism 01/15/2017  . Diverticulosis 01/15/2017  . HLD (hyperlipidemia) 01/15/2017  . Spinal stenosis of  lumbar region 01/15/2017  . Disease of pancreas 07/21/2012     Past Medical History:  Diagnosis Date  . Acute blood loss anemia 03/14/2017  . Acute renal failure superimposed on stage 3 chronic kidney disease ([place]) 03/14/2017  . AKI (acute kidney injury) ([place]) 02/28/2017  . Altered mental status 03/29/2017  . Anemia   . Aortic atherosclerosis ([place])   . Arthritis    "joints might ache at times; not that bad" (06/10/2018)  . Bilateral lower extremity edema 03/04/2017  . Bradycardia   . Chronic kidney disease    ?? renal insufficiency,   . CKD (chronic kidney disease) stage 3, GFR 30-59 ml/min 01/15/2017   ?? renal insufficiency, which she thinks is coming from "all these medications"  . Dementia ([place])   . Diabetes mellitus without complication ([place])    diagnosed 4-5 yrs ago, 06/21/18- "that was years ago"  . Disease of pancreas   . Diverticulitis    s/p perforation and partial colectomy 01/27/14 with 3 benign lymph nodes   . Diverticulosis 01/15/2017  . DJD (degenerative joint disease)   . DM (diabetes mellitus), type 2 with renal complications ([place]) 1/77/1165  . Elevated ferritin level   . Fatty liver   . Hypertension   . Hypothyroidism    "had radiation" (06/10/2018)  . Kidney disease, chronic, stage V (GFR under 15 ml/min) ([place])    per notes from nursing home-she is on dialysis  . Kidney stone   . Lethargy 02/28/2017  . Obesity, Class III, BMI 40-49.9 (morbid obesity) ([place]) 02/27/2017  . Pleural lipoma   . Postoperative wound infection 04/02/2017  . Spinal stenosis of lumbar region 01/15/2017  . Status post lumbar surgery   . Wound healing, delayed    back     Past Surgical History:  Procedure Laterality Date  . ABDOMINAL EXPOSURE N/A 02/24/2017   Procedure: ABDOMINAL EXPOSURE;  Surgeon: Angelia Mould, MD;  Location: [place];  Service: Vascular;  Laterality: N/A;  . ABDOMINAL HYSTERECTOMY  1987   no h/o abnormal paps   . ANTERIOR LAT LUMBAR FUSION N/A 02/24/2017    Procedure: Lumbar three- five Anterior lateral lumbar interbody fusion;  Surgeon: Ditty, Kevan Ny, MD;  Location: [place];  Service: Neurosurgery;  Laterality: N/A;  L3-5 Anterior lateral lumbar interbody fusion with removal of coflex at L3-4, L4-5  . ANTERIOR LUMBAR FUSION N/A 02/24/2017   Procedure: Stage 1: Lumbar five-Sacral one Anterior lumbar interbody fusion;  Surgeon: Ditty, Kevan Ny, MD;  Location: [place];  Service: Neurosurgery;  Laterality: N/A;  Stage 1: L5-S1 Anterior lumbar interbody fusion  . APPENDECTOMY    . APPLICATION OF ROBOTIC ASSISTANCE FOR SPINAL PROCEDURE N/A 02/26/2017   Procedure: APPLICATION OF ROBOTIC ASSISTANCE FOR SPINAL PROCEDURE;  Surgeon: Ditty, Kevan Ny, MD;  Location: [place];  Service: Neurosurgery;  Laterality: N/A;  . APPLICATION OF WOUND VAC N/A 03/30/2017   Procedure: APPLICATION OF WOUND VAC;  Surgeon: Ditty, Kevan Ny, MD;  Location: [place];  Service: Neurosurgery;  Laterality: N/A;  . APPLICATION OF WOUND VAC N/A 06/23/2019   Procedure: APPLICATION OF WOUND VAC;  Surgeon: Consuella Lose, MD;  Location: [place];  Service: Neurosurgery;  Laterality: N/A;  . AV FISTULA PLACEMENT Left 06/08/2019   Procedure: Arteriovenous (Av) Fistula Creation;  Surgeon: Curt Jews  F, MD;  Location: [place];  Service: Vascular;  Laterality: Left;  . BACK SURGERY  2013  . CATARACT EXTRACTION W/ INTRAOCULAR LENS IMPLANT Right   . CHOLECYSTECTOMY OPEN  1982  . COLON SURGERY  01/26/2014   desc.sigmoid colectomy and ventral hernia repair and splenic flexure mobilization  . COLON SURGERY    . DILATION AND CURETTAGE OF UTERUS    . HERNIA REPAIR    . INSERTION OF DIALYSIS CATHETER Right 06/08/2019   Procedure: INSERTION OF DIALYSIS CATHETER RIGHT INTERNAL JUGULAR;  Surgeon: Rosetta Posner, MD;  Location: [place];  Service: Vascular;  Laterality: Right;  . LUMBAR LAMINECTOMY WITH SPINOUS PROCESS PLATE 2 LEVEL N/A 01/08/3015   Procedure: LUMBAR LAMINECTOMY/DECOMPRESSION  MICRODISCECTOMY CoFlex;  Surgeon: Faythe Ghee, MD;  Location: [facility];  Service: Neurosurgery;  Laterality: N/A;  Lumbar three-four,Lumbar Four-Five Laminectomy with Coflex  . LUMBAR WOUND DEBRIDEMENT N/A 03/30/2017   Procedure: Lumbar wound exploration/debridement, placement of wound vac;  Surgeon: Ditty, Kevan Ny, MD;  Location: [place];  Service: Neurosurgery;  Laterality: N/A;  Lumbar wound exploration/debridement, placement of wound vac  . LUMBAR WOUND DEBRIDEMENT N/A 06/06/2018   Procedure: LUMBAR WOUND DEBRIDEMENT/EXPLORATION;  Surgeon: Consuella Lose, MD;  Location: [place];  Service: Neurosurgery;  Laterality: N/A;  . LUMBAR WOUND DEBRIDEMENT N/A 06/22/2018   Procedure: SIMPLE INCISION AND DRAINAGE OF WOUND, APPLICATION OF WOUND VAC;  Surgeon: Consuella Lose, MD;  Location: [place];  Service: Neurosurgery;  Laterality: N/A;  . LUMBAR WOUND DEBRIDEMENT N/A 06/23/2019   Procedure: LUMBAR WOUND DEBRIDEMENT WITH HARDWARE REMOVAL;  Surgeon: Consuella Lose, MD;  Location: [place];  Service: Neurosurgery;  Laterality: N/A;  . PARTIAL COLECTOMY     01/27/14 diverticulitis and 3 benign lymph nodes [facility] Dr. Pat Patrick   . REDUCTION MAMMAPLASTY Bilateral 1992  . THROMBECTOMY BRACHIAL ARTERY Left 06/08/2019   Procedure: Thrombectomy Brachial Artery;  Surgeon: Rosetta Posner, MD;  Location: [place];  Service: Vascular;  Laterality: Left;  . TUBAL LIGATION    . VENTRAL HERNIA REPAIR  2014    Social History   Socioeconomic History  . Marital status: Widowed    Spouse name: Not on file  . Number of children: Not on file  . Years of education: Not on file  . Highest education level: Not on file  Occupational History  . Occupation: retired Economist work  Tobacco Use  . Smoking status: Never Smoker  . Smokeless tobacco: Never Used  Substance and Sexual Activity  . Alcohol use: Not Currently    Comment: "nothing since age 55" (06/10/2018)  . Drug use: Never  . Sexual activity: Not Currently    Other Topics Concern  . Not on file  Social History Narrative   Admitted to [facility] 03/03/17   Widowed since 2012    2 sons [name] comes to appts)    Never smoked   Alcohol none   Full Code   12th grade education retired    Occupational hygienist    Social Determinants of Radio broadcast assistant Strain:   . Difficulty of Paying Living Expenses: Not on file  Food Insecurity:   . Worried About Charity fundraiser in the Last Year: Not on file  . Ran Out of Food in the Last Year: Not on file  Transportation Needs:   . Lack of Transportation (Medical): Not on file  . Lack of Transportation (Non-Medical): Not on file  Physical Activity:   .  Days of Exercise per Week: Not on file  . Minutes of Exercise per Session: Not on file  Stress:   . Feeling of Stress : Not on file  Social Connections:   . Frequency of Communication with Friends and Family: Not on file  . Frequency of Social Gatherings with Friends and Family: Not on file  . Attends Religious Services: Not on file  . Active Member of Clubs or Organizations: Not on file  . Attends Archivist Meetings: Not on file  . Marital Status: Not on file  Intimate Partner Violence:   . Fear of Current or Ex-Partner: Not on file  . Emotionally Abused: Not on file  . Physically Abused: Not on file  . Sexually Abused: Not on file     Family History  Problem Relation Age of Onset  . Diabetes Father   . Heart attack Father   . Hypertension Father   . Heart disease Father   . Kidney disease Father   . Diabetes Mother   . Hypertension Mother   . Heart disease Mother   . Kidney disease Mother   . Lupus Sister   . Heart disease Brother   . Diabetes Brother   . Heart disease Brother   . Diabetes Brother   . Lupus Sister      Current Outpatient Medications:  .  acetaminophen (TYLENOL) 500 MG tablet, Take 500 mg by mouth daily as needed for mild pain. , Disp: , Rfl:  .  atorvastatin (LIPITOR) 40 MG  tablet, Take 1 tablet (40 mg total) by mouth daily at 6 PM., Disp: 90 tablet, Rfl: 3 .  benzonatate (TESSALON PERLES) 100 MG capsule, Take 1 capsule (100 mg total) by mouth 3 (three) times daily as needed for cough., Disp: 30 capsule, Rfl: 0 .  citalopram (CELEXA) 10 MG tablet, Take 10 mg by mouth daily., Disp: , Rfl:  .  divalproex (DEPAKOTE SPRINKLE) 125 MG capsule, Take 125 mg by mouth daily. , Disp: , Rfl:  .  docusate sodium (COLACE) 100 MG capsule, Take 100 mg by mouth 2 (two) times daily., Disp: , Rfl:  .  donepezil (ARICEPT) 5 MG tablet, Take 5 mg by mouth at bedtime., Disp: , Rfl:  .  folic acid (FOLVITE) 384 MCG tablet, Take 400 mcg by mouth daily., Disp: , Rfl:  .  gabapentin (NEURONTIN) 100 MG capsule, Take 2 capsules (200 mg total) by mouth at bedtime., Disp: 30 capsule, Rfl: 0 .  labetalol (NORMODYNE) 100 MG tablet, Take 1 tablet (100 mg total) by mouth 2 (two) times daily., Disp: 60 tablet, Rfl: 0 .  Lactobacillus (ACIDOPHILUS PO), Take 175 mg by mouth 2 (two) times daily., Disp: , Rfl:  .  lactulose (CHRONULAC) 10 GM/15ML solution, Take 30 mLs (20 g total) by mouth daily., Disp: 236 mL, Rfl: 0 .  levalbuterol (XOPENEX) 1.25 MG/0.5ML nebulizer solution, Take 1.25 mg by nebulization every 6 (six) hours as needed for wheezing or shortness of breath., Disp: 1 each, Rfl: 12 .  levothyroxine (SYNTHROID, LEVOTHROID) 100 MCG tablet, Take 100 mcg by mouth daily before breakfast., Disp: , Rfl:  .  meclizine (ANTIVERT) 12.5 MG tablet, Take 12.5 mg by mouth daily., Disp: , Rfl:  .  ondansetron (ZOFRAN-ODT) 4 MG disintegrating tablet, Take 4 mg by mouth every 8 (eight) hours as needed for nausea or vomiting., Disp: , Rfl:  .  risperiDONE (RISPERDAL) 0.25 MG tablet, Take 0.25 mg by mouth 2 (two) times daily., Disp: ,  Rfl:  .  senna-docusate (SENOKOT S) 8.6-50 MG tablet, Take 1 tablet by mouth at bedtime as needed. , Disp: , Rfl:  .  sevelamer carbonate (RENVELA) 800 MG tablet, Take 1 tablet (800  mg total) by mouth 3 (three) times daily with meals., Disp: 90 tablet, Rfl: 0 .  traMADol (ULTRAM) 50 MG tablet, Take 25 mg by mouth every 6 (six) hours as needed. , Disp: , Rfl:  .  memantine (NAMENDA) 5 MG tablet, Take 5 mg by mouth 2 (two) times daily., Disp: , Rfl:    Physical exam:  Vitals:   09/05/19 1343  BP: (!) 142/50  Pulse: 63  Resp: 18  Temp: 98.6 F (37 C)  TempSrc: Tympanic  SpO2: 98%  Weight: 204 lb 14.4 oz (92.9 kg)   Physical Exam Constitutional:      Comments: Sitting in a wheelchair appears in no acute distress  HENT:     Head: Normocephalic and atraumatic.  Eyes:     Pupils: Pupils are equal, round, and reactive to light.  Cardiovascular:     Rate and Rhythm: Normal rate and regular rhythm.     Heart sounds: Normal heart sounds.  Pulmonary:     Effort: Pulmonary effort is normal.     Breath sounds: Normal breath sounds.  Abdominal:     General: Bowel sounds are normal.     Palpations: Abdomen is soft.  Musculoskeletal:     Cervical back: Normal range of motion.  Skin:    General: Skin is warm and dry.  Neurological:     Mental Status: She is alert.     Comments: Oriented to self only        CMP Latest Ref Rng & Units 08/07/2019  Glucose 70 - 99 mg/dL 80  BUN 8 - 23 mg/dL 10  Creatinine 0.44 - 1.00 mg/dL 2.96(H)  Sodium 135 - 145 mmol/L 135  Potassium 3.5 - 5.1 mmol/L 4.1  Chloride 98 - 111 mmol/L 100  CO2 22 - 32 mmol/L 24  Calcium 8.9 - 10.3 mg/dL 8.0(L)  Total Protein 6.5 - 8.1 g/dL 6.3(L)  Total Bilirubin 0.3 - 1.2 mg/dL 0.7  Alkaline Phos 38 - 126 U/L 59  AST 15 - 41 U/L 29  ALT 0 - 44 U/L <5   CBC Latest Ref Rng & Units 08/07/2019  WBC 4.0 - 10.5 K/uL 4.3  Hemoglobin 12.0 - 15.0 g/dL 7.5(L)  Hematocrit 36.0 - 46.0 % 23.6(L)  Platelets 150 - 400 K/uL 157    No images are attached to the encounter.  DG Chest 1 View  Result Date: 08/07/2019 CLINICAL DATA:  Weakness EXAM: CHEST  1 VIEW COMPARISON:  06/02/2019 FINDINGS: There is a  well-positioned tunneled dialysis catheter on the right. There is no pneumothorax. No significant pleural effusion. The heart size is mildly enlarged. There is no acute osseous abnormality. Degenerative changes are noted of both glenohumeral joints. There is no focal infiltrate. There are few Kerley B lines. IMPRESSION: 1. Mild interstitial edema. 2. No significant pleural effusion or pneumothorax. 3. Well-positioned tunneled dialysis catheter on the right. Electronically Signed   By: Constance Holster M.D.   On: 08/07/2019 22:54    Assessment and plan- Patient is a 78 y.o. female referred for elevated ferritin  I suspect her elevated ferritin is an acute phase reactant especially given her history of multiple comorbidities including hypertension diabetes and end-stage renal disease on dialysis.  I wanted to get a comprehensive anemia evaluation including CBC  with differential, CMP, repeat ferritin folate iron studies, haptoglobin, myeloma panel serum free light chains and B12 levels.  However she was stuck multiple times and we were not able to obtain any blood work for ferritin folate ferritin and iron studies reticulocyte count ESR and TSH at this time.  We will see if we can get her labs done with dialysis.  I will plan for a video visit with one of her caregivers at [facility] and also have her son involved to discuss the results of her blood work.  Given her multiple comorbidities dementia and ESRD I am inclined to monitor her ferritin conservatively at this time and repeat her levels in 3 months and decide where to go from there   Thank you for this kind referral and the opportunity to participate in the care of this patient   Visit Diagnosis 1. Normocytic anemia   2. Elevated ferritin     Dr. Randa Evens, MD, MPH [facility] [phone] 09/06/2019  1:44 PM

## 2019-09-07 ENCOUNTER — Telehealth: Payer: Self-pay

## 2019-09-07 LAB — MULTIPLE MYELOMA PANEL, SERUM
Albumin SerPl Elph-Mcnc: 2.3 g/dL — ABNORMAL LOW (ref 2.9–4.4)
Albumin/Glob SerPl: 0.8 (ref 0.7–1.7)
Alpha 1: 0.3 g/dL (ref 0.0–0.4)
Alpha2 Glob SerPl Elph-Mcnc: 0.7 g/dL (ref 0.4–1.0)
B-Globulin SerPl Elph-Mcnc: 0.7 g/dL (ref 0.7–1.3)
Gamma Glob SerPl Elph-Mcnc: 1.4 g/dL (ref 0.4–1.8)
Globulin, Total: 3.1 g/dL (ref 2.2–3.9)
IgA: 384 mg/dL (ref 64–422)
IgG (Immunoglobin G), Serum: 1142 mg/dL (ref 586–1602)
IgM (Immunoglobulin M), Srm: 417 mg/dL — ABNORMAL HIGH (ref 26–217)
Total Protein ELP: 5.4 g/dL — ABNORMAL LOW (ref 6.0–8.5)

## 2019-09-07 NOTE — Telephone Encounter (Signed)
Called son regarding rescheduling patient for Fistulogram and he states that he knows nothing about it and due to Covid I need to call the facility.   [facility] and they state that the patient no longer needs procedure. Left message with dialysis as well in case they disagree.   York Cerise, CMA

## 2019-09-08 ENCOUNTER — Ambulatory Visit
Admission: RE | Admit: 2019-09-08 | Discharge: 2019-09-08 | Disposition: A | Discharge status: Home / Self Care | Payer: Medicare Other | Source: Ambulatory Visit | Attending: Acute Care | Admitting: Acute Care

## 2019-09-08 ENCOUNTER — Other Ambulatory Visit: Payer: Self-pay

## 2019-09-08 DIAGNOSIS — N186 End stage renal disease: Secondary | ICD-10-CM | POA: Diagnosis not present

## 2019-09-08 DIAGNOSIS — R413 Other amnesia: Secondary | ICD-10-CM | POA: Diagnosis not present

## 2019-09-08 DIAGNOSIS — Z992 Dependence on renal dialysis: Secondary | ICD-10-CM | POA: Diagnosis not present

## 2019-09-08 DIAGNOSIS — D631 Anemia in chronic kidney disease: Secondary | ICD-10-CM | POA: Diagnosis not present

## 2019-09-08 DIAGNOSIS — D509 Iron deficiency anemia, unspecified: Secondary | ICD-10-CM | POA: Diagnosis not present

## 2019-09-08 DIAGNOSIS — R509 Fever, unspecified: Secondary | ICD-10-CM | POA: Diagnosis not present

## 2019-09-08 DIAGNOSIS — D689 Coagulation defect, unspecified: Secondary | ICD-10-CM | POA: Diagnosis not present

## 2019-09-08 DIAGNOSIS — N2581 Secondary hyperparathyroidism of renal origin: Secondary | ICD-10-CM | POA: Diagnosis not present

## 2019-09-09 DIAGNOSIS — N186 End stage renal disease: Secondary | ICD-10-CM | POA: Diagnosis not present

## 2019-09-09 DIAGNOSIS — D519 Vitamin B12 deficiency anemia, unspecified: Secondary | ICD-10-CM | POA: Diagnosis not present

## 2019-09-09 DIAGNOSIS — R6889 Other general symptoms and signs: Secondary | ICD-10-CM | POA: Diagnosis not present

## 2019-09-09 DIAGNOSIS — D649 Anemia, unspecified: Secondary | ICD-10-CM | POA: Diagnosis not present

## 2019-09-09 DIAGNOSIS — M2550 Pain in unspecified joint: Secondary | ICD-10-CM | POA: Diagnosis not present

## 2019-09-09 DIAGNOSIS — E039 Hypothyroidism, unspecified: Secondary | ICD-10-CM | POA: Diagnosis not present

## 2019-09-10 DIAGNOSIS — Z992 Dependence on renal dialysis: Secondary | ICD-10-CM | POA: Diagnosis not present

## 2019-09-10 DIAGNOSIS — D689 Coagulation defect, unspecified: Secondary | ICD-10-CM | POA: Diagnosis not present

## 2019-09-10 DIAGNOSIS — R509 Fever, unspecified: Secondary | ICD-10-CM | POA: Diagnosis not present

## 2019-09-10 DIAGNOSIS — D631 Anemia in chronic kidney disease: Secondary | ICD-10-CM | POA: Diagnosis not present

## 2019-09-10 DIAGNOSIS — N186 End stage renal disease: Secondary | ICD-10-CM | POA: Diagnosis not present

## 2019-09-10 DIAGNOSIS — D509 Iron deficiency anemia, unspecified: Secondary | ICD-10-CM | POA: Diagnosis not present

## 2019-09-10 DIAGNOSIS — N2581 Secondary hyperparathyroidism of renal origin: Secondary | ICD-10-CM | POA: Diagnosis not present

## 2019-09-12 DIAGNOSIS — M6281 Muscle weakness (generalized): Secondary | ICD-10-CM | POA: Diagnosis not present

## 2019-09-12 DIAGNOSIS — R2681 Unsteadiness on feet: Secondary | ICD-10-CM | POA: Diagnosis not present

## 2019-09-13 DIAGNOSIS — Z992 Dependence on renal dialysis: Secondary | ICD-10-CM | POA: Diagnosis not present

## 2019-09-13 DIAGNOSIS — M6281 Muscle weakness (generalized): Secondary | ICD-10-CM | POA: Diagnosis not present

## 2019-09-13 DIAGNOSIS — I10 Essential (primary) hypertension: Secondary | ICD-10-CM | POA: Diagnosis not present

## 2019-09-13 DIAGNOSIS — N2581 Secondary hyperparathyroidism of renal origin: Secondary | ICD-10-CM | POA: Diagnosis not present

## 2019-09-13 DIAGNOSIS — D631 Anemia in chronic kidney disease: Secondary | ICD-10-CM | POA: Diagnosis not present

## 2019-09-13 DIAGNOSIS — D509 Iron deficiency anemia, unspecified: Secondary | ICD-10-CM | POA: Diagnosis not present

## 2019-09-13 DIAGNOSIS — N186 End stage renal disease: Secondary | ICD-10-CM | POA: Diagnosis not present

## 2019-09-13 DIAGNOSIS — R509 Fever, unspecified: Secondary | ICD-10-CM | POA: Diagnosis not present

## 2019-09-13 DIAGNOSIS — D689 Coagulation defect, unspecified: Secondary | ICD-10-CM | POA: Diagnosis not present

## 2019-09-13 DIAGNOSIS — R2681 Unsteadiness on feet: Secondary | ICD-10-CM | POA: Diagnosis not present

## 2019-09-14 DIAGNOSIS — R2681 Unsteadiness on feet: Secondary | ICD-10-CM | POA: Diagnosis not present

## 2019-09-14 DIAGNOSIS — M6281 Muscle weakness (generalized): Secondary | ICD-10-CM | POA: Diagnosis not present

## 2019-09-15 DIAGNOSIS — D689 Coagulation defect, unspecified: Secondary | ICD-10-CM | POA: Diagnosis not present

## 2019-09-15 DIAGNOSIS — N186 End stage renal disease: Secondary | ICD-10-CM | POA: Diagnosis not present

## 2019-09-15 DIAGNOSIS — D509 Iron deficiency anemia, unspecified: Secondary | ICD-10-CM | POA: Diagnosis not present

## 2019-09-15 DIAGNOSIS — R2681 Unsteadiness on feet: Secondary | ICD-10-CM | POA: Diagnosis not present

## 2019-09-15 DIAGNOSIS — D631 Anemia in chronic kidney disease: Secondary | ICD-10-CM | POA: Diagnosis not present

## 2019-09-15 DIAGNOSIS — Z992 Dependence on renal dialysis: Secondary | ICD-10-CM | POA: Diagnosis not present

## 2019-09-15 DIAGNOSIS — M6281 Muscle weakness (generalized): Secondary | ICD-10-CM | POA: Diagnosis not present

## 2019-09-15 DIAGNOSIS — N2581 Secondary hyperparathyroidism of renal origin: Secondary | ICD-10-CM | POA: Diagnosis not present

## 2019-09-16 DIAGNOSIS — S31811D Laceration without foreign body of right buttock, subsequent encounter: Secondary | ICD-10-CM | POA: Diagnosis not present

## 2019-09-16 DIAGNOSIS — R2681 Unsteadiness on feet: Secondary | ICD-10-CM | POA: Diagnosis not present

## 2019-09-16 DIAGNOSIS — S70311D Abrasion, right thigh, subsequent encounter: Secondary | ICD-10-CM | POA: Diagnosis not present

## 2019-09-16 DIAGNOSIS — M6281 Muscle weakness (generalized): Secondary | ICD-10-CM | POA: Diagnosis not present

## 2019-09-16 DIAGNOSIS — L0889 Other specified local infections of the skin and subcutaneous tissue: Secondary | ICD-10-CM | POA: Diagnosis not present

## 2019-09-16 DIAGNOSIS — T8189XD Other complications of procedures, not elsewhere classified, subsequent encounter: Secondary | ICD-10-CM | POA: Diagnosis not present

## 2019-09-17 DIAGNOSIS — D509 Iron deficiency anemia, unspecified: Secondary | ICD-10-CM | POA: Diagnosis not present

## 2019-09-17 DIAGNOSIS — N2581 Secondary hyperparathyroidism of renal origin: Secondary | ICD-10-CM | POA: Diagnosis not present

## 2019-09-17 DIAGNOSIS — D689 Coagulation defect, unspecified: Secondary | ICD-10-CM | POA: Diagnosis not present

## 2019-09-17 DIAGNOSIS — Z992 Dependence on renal dialysis: Secondary | ICD-10-CM | POA: Diagnosis not present

## 2019-09-17 DIAGNOSIS — N186 End stage renal disease: Secondary | ICD-10-CM | POA: Diagnosis not present

## 2019-09-17 DIAGNOSIS — D631 Anemia in chronic kidney disease: Secondary | ICD-10-CM | POA: Diagnosis not present

## 2019-09-19 DIAGNOSIS — M6281 Muscle weakness (generalized): Secondary | ICD-10-CM | POA: Diagnosis not present

## 2019-09-19 DIAGNOSIS — R2681 Unsteadiness on feet: Secondary | ICD-10-CM | POA: Diagnosis not present

## 2019-09-19 DIAGNOSIS — T8131XD Disruption of external operation (surgical) wound, not elsewhere classified, subsequent encounter: Secondary | ICD-10-CM | POA: Diagnosis not present

## 2019-09-19 DIAGNOSIS — T8149XA Infection following a procedure, other surgical site, initial encounter: Secondary | ICD-10-CM | POA: Diagnosis not present

## 2019-09-19 DIAGNOSIS — I10 Essential (primary) hypertension: Secondary | ICD-10-CM | POA: Diagnosis not present

## 2019-09-20 ENCOUNTER — Other Ambulatory Visit: Payer: Self-pay

## 2019-09-20 ENCOUNTER — Inpatient Hospital Stay: Payer: Medicare Other | Admitting: Oncology

## 2019-09-20 ENCOUNTER — Encounter: Payer: Self-pay | Admitting: Oncology

## 2019-09-20 DIAGNOSIS — Z992 Dependence on renal dialysis: Secondary | ICD-10-CM | POA: Diagnosis not present

## 2019-09-20 DIAGNOSIS — D689 Coagulation defect, unspecified: Secondary | ICD-10-CM | POA: Diagnosis not present

## 2019-09-20 DIAGNOSIS — N2581 Secondary hyperparathyroidism of renal origin: Secondary | ICD-10-CM | POA: Diagnosis not present

## 2019-09-20 DIAGNOSIS — D509 Iron deficiency anemia, unspecified: Secondary | ICD-10-CM | POA: Diagnosis not present

## 2019-09-20 DIAGNOSIS — N186 End stage renal disease: Secondary | ICD-10-CM | POA: Diagnosis not present

## 2019-09-20 DIAGNOSIS — D631 Anemia in chronic kidney disease: Secondary | ICD-10-CM | POA: Diagnosis not present

## 2019-09-20 DIAGNOSIS — R2681 Unsteadiness on feet: Secondary | ICD-10-CM | POA: Diagnosis not present

## 2019-09-20 DIAGNOSIS — M6281 Muscle weakness (generalized): Secondary | ICD-10-CM | POA: Diagnosis not present

## 2019-09-21 DIAGNOSIS — R2681 Unsteadiness on feet: Secondary | ICD-10-CM | POA: Diagnosis not present

## 2019-09-21 DIAGNOSIS — M6281 Muscle weakness (generalized): Secondary | ICD-10-CM | POA: Diagnosis not present

## 2019-09-22 DIAGNOSIS — N186 End stage renal disease: Secondary | ICD-10-CM | POA: Diagnosis not present

## 2019-09-22 DIAGNOSIS — Z992 Dependence on renal dialysis: Secondary | ICD-10-CM | POA: Diagnosis not present

## 2019-09-22 DIAGNOSIS — R2681 Unsteadiness on feet: Secondary | ICD-10-CM | POA: Diagnosis not present

## 2019-09-22 DIAGNOSIS — D689 Coagulation defect, unspecified: Secondary | ICD-10-CM | POA: Diagnosis not present

## 2019-09-22 DIAGNOSIS — N2581 Secondary hyperparathyroidism of renal origin: Secondary | ICD-10-CM | POA: Diagnosis not present

## 2019-09-22 DIAGNOSIS — D631 Anemia in chronic kidney disease: Secondary | ICD-10-CM | POA: Diagnosis not present

## 2019-09-22 DIAGNOSIS — D509 Iron deficiency anemia, unspecified: Secondary | ICD-10-CM | POA: Diagnosis not present

## 2019-09-22 DIAGNOSIS — M6281 Muscle weakness (generalized): Secondary | ICD-10-CM | POA: Diagnosis not present

## 2019-09-23 ENCOUNTER — Encounter: Payer: Self-pay | Admitting: Oncology

## 2019-09-23 ENCOUNTER — Inpatient Hospital Stay (HOSPITAL_BASED_OUTPATIENT_CLINIC_OR_DEPARTMENT_OTHER): Payer: Medicare Other | Admitting: Oncology

## 2019-09-23 ENCOUNTER — Inpatient Hospital Stay: Payer: Medicare Other | Admitting: Oncology

## 2019-09-23 ENCOUNTER — Other Ambulatory Visit: Payer: Self-pay

## 2019-09-23 DIAGNOSIS — N189 Chronic kidney disease, unspecified: Secondary | ICD-10-CM

## 2019-09-23 DIAGNOSIS — R7989 Other specified abnormal findings of blood chemistry: Secondary | ICD-10-CM | POA: Diagnosis not present

## 2019-09-23 DIAGNOSIS — R2681 Unsteadiness on feet: Secondary | ICD-10-CM | POA: Diagnosis not present

## 2019-09-23 DIAGNOSIS — Z862 Personal history of diseases of the blood and blood-forming organs and certain disorders involving the immune mechanism: Secondary | ICD-10-CM

## 2019-09-23 DIAGNOSIS — M6281 Muscle weakness (generalized): Secondary | ICD-10-CM | POA: Diagnosis not present

## 2019-09-23 NOTE — Progress Notes (Unsigned)
Nurse will be with pt for the video visit. Pt has dementia and sometimes is accurate with her thoughts and sometimes she is not.

## 2019-09-24 DIAGNOSIS — Z992 Dependence on renal dialysis: Secondary | ICD-10-CM | POA: Diagnosis not present

## 2019-09-24 DIAGNOSIS — N186 End stage renal disease: Secondary | ICD-10-CM | POA: Diagnosis not present

## 2019-09-24 DIAGNOSIS — D509 Iron deficiency anemia, unspecified: Secondary | ICD-10-CM | POA: Diagnosis not present

## 2019-09-24 DIAGNOSIS — N2581 Secondary hyperparathyroidism of renal origin: Secondary | ICD-10-CM | POA: Diagnosis not present

## 2019-09-24 DIAGNOSIS — D631 Anemia in chronic kidney disease: Secondary | ICD-10-CM | POA: Diagnosis not present

## 2019-09-24 DIAGNOSIS — D689 Coagulation defect, unspecified: Secondary | ICD-10-CM | POA: Diagnosis not present

## 2019-09-26 ENCOUNTER — Other Ambulatory Visit: Payer: Self-pay

## 2019-09-26 ENCOUNTER — Telehealth: Payer: Self-pay | Admitting: Oncology

## 2019-09-26 DIAGNOSIS — M6281 Muscle weakness (generalized): Secondary | ICD-10-CM | POA: Diagnosis not present

## 2019-09-26 DIAGNOSIS — R2681 Unsteadiness on feet: Secondary | ICD-10-CM | POA: Diagnosis not present

## 2019-09-26 NOTE — Progress Notes (Signed)
I connected with Taylor Tyler on 09/26/19 at  2:15 PM EST by video enabled telemedicine visit and verified that I am speaking with the correct person using two identifiers.   I discussed the limitations, risks, security and privacy concerns of performing an evaluation and management service by telemedicine and the availability of in-person appointments. I also discussed with the patient that there may be a patient responsible charge related to this service. The patient expressed understanding and agreed to proceed.  Other persons participating in the visit and their role in the encounter:  Patients caregiver from [facility]  Patient's location:  Database administrator Provider's location:  work  Risk analyst Complaint:  Discuss results of bloodwork  History of present illness: Patient is a 79 year old female With a past medical history significant for hypertension, chronic kidney disease, hypertension and diabetes as well as ongoing memory loss.  She is here alone and reports that she is at home and can cook her own food when in fact she is a resident of [facility].  I did speak to her son over the phone today since patient cannot give any history at this time.  She has been referred to me for elevated ferritin.  Her most recent ferritin on 08/31/2019 was greater than 1500.  Patient also was recently admitted to the hospital in December 2020 and was found to have a white count of 9.4, H&H of 8.8/29.4 and a platelet count of 145.  Patient son reports that she sees [facility] she has been getting 10,000 units of EPO with dialysis 3 times a week.   Interval history patient has recovered from Covid and her recent Covid tests x2 have been negative at dialysis.  She has issues with dementia and does not recollect any of her medical history accurately.  I mainly spoke to her caregiver from [facility].  We also tried to get her son involved in today's visit but he did not want to join the  conversation.   Review of Systems  Unable to perform ROS: Dementia    Allergies  Allergen Reactions  . Clams [Shellfish Allergy] Swelling and Other (See Comments)    THROAT SWELLS NECK TURNS RED  . Hydralazine Other (See Comments)    CHEST TIGHTNESS Patient has tolerated multiple doses of hydralazine since allergy was listed   . Norvasc [Amlodipine Besylate] Swelling    Leg edema   . Milk-Related Compounds Diarrhea    Past Medical History:  Diagnosis Date  . Acute blood loss anemia 03/14/2017  . Acute renal failure superimposed on stage 3 chronic kidney disease ([place]) 03/14/2017  . AKI (acute kidney injury) ([place]) 02/28/2017  . Altered mental status 03/29/2017  . Anemia   . Aortic atherosclerosis ([place])   . Arthritis    "joints might ache at times; not that bad" (06/10/2018)  . Bilateral lower extremity edema 03/04/2017  . Bradycardia   . Chronic kidney disease    ?? renal insufficiency,   . CKD (chronic kidney disease) stage 3, GFR 30-59 ml/min 01/15/2017   ?? renal insufficiency, which she thinks is coming from "all these medications"  . Dementia ([place])   . Diabetes mellitus without complication ([place])    diagnosed 4-5 yrs ago, 06/21/18- "that was years ago"  . Disease of pancreas   . Diverticulitis    s/p perforation and partial colectomy 01/27/14 with 3 benign lymph nodes   . Diverticulosis 01/15/2017  . DJD (degenerative joint disease)   .  DM (diabetes mellitus), type 2 with renal complications ([place]) 9/38/1829  . Elevated ferritin level   . Fatty liver   . Hypertension   . Hypothyroidism    "had radiation" (06/10/2018)  . Kidney disease, chronic, stage V (GFR under 15 ml/min) ([place])    per notes from nursing home-she is on dialysis  . Kidney stone   . Lethargy 02/28/2017  . Obesity, Class III, BMI 40-49.9 (morbid obesity) ([place]) 02/27/2017  . Pleural lipoma   . Postoperative wound infection 04/02/2017  . Spinal stenosis of lumbar region 01/15/2017  . Status post lumbar  surgery   . Wound healing, delayed    back    Past Surgical History:  Procedure Laterality Date  . ABDOMINAL EXPOSURE N/A 02/24/2017   Procedure: ABDOMINAL EXPOSURE;  Surgeon: Angelia Mould, MD;  Location: [facility];  Service: Vascular;  Laterality: N/A;  . ABDOMINAL HYSTERECTOMY  1987   no h/o abnormal paps   . ANTERIOR LAT LUMBAR FUSION N/A 02/24/2017   Procedure: Lumbar three- five Anterior lateral lumbar interbody fusion;  Surgeon: Ditty, Kevan Ny, MD;  Location: [facility];  Service: Neurosurgery;  Laterality: N/A;  L3-5 Anterior lateral lumbar interbody fusion with removal of coflex at L3-4, L4-5  . ANTERIOR LUMBAR FUSION N/A 02/24/2017   Procedure: Stage 1: Lumbar five-Sacral one Anterior lumbar interbody fusion;  Surgeon: Ditty, Kevan Ny, MD;  Location: [facility];  Service: Neurosurgery;  Laterality: N/A;  Stage 1: L5-S1 Anterior lumbar interbody fusion  . APPENDECTOMY    . APPLICATION OF ROBOTIC ASSISTANCE FOR SPINAL PROCEDURE N/A 02/26/2017   Procedure: APPLICATION OF ROBOTIC ASSISTANCE FOR SPINAL PROCEDURE;  Surgeon: Ditty, Kevan Ny, MD;  Location: [place];  Service: Neurosurgery;  Laterality: N/A;  . APPLICATION OF WOUND VAC N/A 03/30/2017   Procedure: APPLICATION OF WOUND VAC;  Surgeon: Ditty, Kevan Ny, MD;  Location: [facility];  Service: Neurosurgery;  Laterality: N/A;  . APPLICATION OF WOUND VAC N/A 06/23/2019   Procedure: APPLICATION OF WOUND VAC;  Surgeon: Consuella Lose, MD;  Location: [place];  Service: Neurosurgery;  Laterality: N/A;  . AV FISTULA PLACEMENT Left 06/08/2019   Procedure: Arteriovenous (Av) Fistula Creation;  Surgeon: Rosetta Posner, MD;  Location: [facility];  Service: Vascular;  Laterality: Left;  . BACK SURGERY  2013  . CATARACT EXTRACTION W/ INTRAOCULAR LENS IMPLANT Right   . CHOLECYSTECTOMY OPEN  1982  . COLON SURGERY  01/26/2014   desc.sigmoid colectomy and ventral hernia repair and splenic flexure mobilization  . COLON SURGERY    . DILATION  AND CURETTAGE OF UTERUS    . HERNIA REPAIR    . INSERTION OF DIALYSIS CATHETER Right 06/08/2019   Procedure: INSERTION OF DIALYSIS CATHETER RIGHT INTERNAL JUGULAR;  Surgeon: Rosetta Posner, MD;  Location: [facility];  Service: Vascular;  Laterality: Right;  . LUMBAR LAMINECTOMY WITH SPINOUS PROCESS PLATE 2 LEVEL N/A 9/37/1696   Procedure: LUMBAR LAMINECTOMY/DECOMPRESSION MICRODISCECTOMY CoFlex;  Surgeon: Faythe Ghee, MD;  [facility]: [facility];  Service: Neurosurgery;  Laterality: N/A;  Lumbar three-four,Lumbar Four-Five Laminectomy with Coflex  . LUMBAR WOUND DEBRIDEMENT N/A 03/30/2017   Procedure: Lumbar wound exploration/debridement, placement of wound vac;  Surgeon: Ditty, Kevan Ny, MD;  Location: [facility];  Service: Neurosurgery;  Laterality: N/A;  Lumbar wound exploration/debridement, placement of wound vac  . LUMBAR WOUND DEBRIDEMENT N/A 06/06/2018   Procedure: LUMBAR WOUND DEBRIDEMENT/EXPLORATION;  Surgeon: Consuella Lose, MD;  Location: [place];  Service: Neurosurgery;  Laterality: N/A;  . LUMBAR WOUND  DEBRIDEMENT N/A 06/22/2018   Procedure: SIMPLE INCISION AND DRAINAGE OF WOUND, APPLICATION OF WOUND VAC;  Surgeon: Consuella Lose, MD;  Location: [facility];  Service: Neurosurgery;  Laterality: N/A;  . LUMBAR WOUND DEBRIDEMENT N/A 06/23/2019   Procedure: LUMBAR WOUND DEBRIDEMENT WITH HARDWARE REMOVAL;  Surgeon: Consuella Lose, MD;  Location: [facility];  Service: Neurosurgery;  Laterality: N/A;  . PARTIAL COLECTOMY     01/27/14 diverticulitis and 3 benign lymph nodes [facility] Dr. Pat Patrick   . REDUCTION MAMMAPLASTY Bilateral 1992  . THROMBECTOMY BRACHIAL ARTERY Left 06/08/2019   Procedure: Thrombectomy Brachial Artery;  Surgeon: Rosetta Posner, MD;  Location: [facility];  Service: Vascular;  Laterality: Left;  . TUBAL LIGATION    . VENTRAL HERNIA REPAIR  2014    Social History   Socioeconomic History  . Marital status: Widowed    Spouse name: Not on file  . Number of children: Not on file  . Years  of education: Not on file  . Highest education level: Not on file  Occupational History  . Occupation: retired Economist work  Tobacco Use  . Smoking status: Never Smoker  . Smokeless tobacco: Never Used  Substance and Sexual Activity  . Alcohol use: Not Currently    Comment: "nothing since age 28" (06/10/2018)  . Drug use: Never  . Sexual activity: Not Currently  Other Topics Concern  . Not on file  Social History Narrative   Admitted to [facility] 03/03/17   Widowed since 2012    2 sons [name] comes to appts)    Never smoked   Alcohol none   Full Code   12th grade education retired    Occupational hygienist    Social Determinants of Radio broadcast assistant Strain:   . Difficulty of Paying Living Expenses: Not on file  Food Insecurity:   . Worried About Charity fundraiser in the Last Year: Not on file  . Ran Out of Food in the Last Year: Not on file  Transportation Needs:   . Lack of Transportation (Medical): Not on file  . Lack of Transportation (Non-Medical): Not on file  Physical Activity:   . Days of Exercise per Week: Not on file  . Minutes of Exercise per Session: Not on file  Stress:   . Feeling of Stress : Not on file  Social Connections:   . Frequency of Communication with Friends and Family: Not on file  . Frequency of Social Gatherings with Friends and Family: Not on file  . Attends Religious Services: Not on file  . Active Member of Clubs or Organizations: Not on file  . Attends Archivist Meetings: Not on file  . Marital Status: Not on file  Intimate Partner Violence:   . Fear of Current or Ex-Partner: Not on file  . Emotionally Abused: Not on file  . Physically Abused: Not on file  . Sexually Abused: Not on file    Family History  Problem Relation Age of Onset  . Diabetes Father   . Heart attack Father   . Hypertension Father   . Heart disease Father   . Kidney disease Father   . Diabetes Mother   . Hypertension Mother   .  Heart disease Mother   . Kidney disease Mother   . Lupus Sister   . Heart disease Brother   . Diabetes Brother   . Heart disease Brother   . Diabetes Brother   . Lupus Sister  Current Outpatient Medications:  .  acetaminophen (TYLENOL) 500 MG tablet, Take 500 mg by mouth daily as needed for mild pain. , Disp: , Rfl:  .  atorvastatin (LIPITOR) 40 MG tablet, Take 1 tablet (40 mg total) by mouth daily at 6 PM., Disp: 90 tablet, Rfl: 3 .  benzonatate (TESSALON PERLES) 100 MG capsule, Take 1 capsule (100 mg total) by mouth 3 (three) times daily as needed for cough., Disp: 30 capsule, Rfl: 0 .  citalopram (CELEXA) 10 MG tablet, Take 10 mg by mouth daily., Disp: , Rfl:  .  divalproex (DEPAKOTE SPRINKLE) 125 MG capsule, Take 125 mg by mouth daily. , Disp: , Rfl:  .  docusate sodium (COLACE) 100 MG capsule, Take 100 mg by mouth 2 (two) times daily., Disp: , Rfl:  .  donepezil (ARICEPT) 5 MG tablet, Take 5 mg by mouth at bedtime., Disp: , Rfl:  .  folic acid (FOLVITE) 916 MCG tablet, Take 400 mcg by mouth daily., Disp: , Rfl:  .  gabapentin (NEURONTIN) 100 MG capsule, Take 2 capsules (200 mg total) by mouth at bedtime., Disp: 30 capsule, Rfl: 0 .  labetalol (NORMODYNE) 100 MG tablet, Take 1 tablet (100 mg total) by mouth 2 (two) times daily., Disp: 60 tablet, Rfl: 0 .  Lactobacillus (ACIDOPHILUS PO), Take 175 mg by mouth 2 (two) times daily., Disp: , Rfl:  .  lactulose (CHRONULAC) 10 GM/15ML solution, Take 30 mLs (20 g total) by mouth daily., Disp: 236 mL, Rfl: 0 .  levalbuterol (XOPENEX) 1.25 MG/0.5ML nebulizer solution, Take 1.25 mg by nebulization every 6 (six) hours as needed for wheezing or shortness of breath., Disp: 1 each, Rfl: 12 .  levothyroxine (SYNTHROID, LEVOTHROID) 100 MCG tablet, Take 100 mcg by mouth daily before breakfast., Disp: , Rfl:  .  meclizine (ANTIVERT) 12.5 MG tablet, Take 12.5 mg by mouth daily., Disp: , Rfl:  .  memantine (NAMENDA) 5 MG tablet, Take 5 mg by mouth 2  (two) times daily., Disp: , Rfl:  .  ondansetron (ZOFRAN-ODT) 4 MG disintegrating tablet, Take 4 mg by mouth every 8 (eight) hours as needed for nausea or vomiting., Disp: , Rfl:  .  risperiDONE (RISPERDAL) 0.25 MG tablet, Take 0.25 mg by mouth 2 (two) times daily., Disp: , Rfl:  .  senna-docusate (SENOKOT S) 8.6-50 MG tablet, Take 1 tablet by mouth at bedtime as needed. , Disp: , Rfl:  .  sevelamer carbonate (RENVELA) 800 MG tablet, Take 1 tablet (800 mg total) by mouth 3 (three) times daily with meals., Disp: 90 tablet, Rfl: 0 .  traMADol (ULTRAM) 50 MG tablet, Take 25 mg by mouth every 6 (six) hours as needed. , Disp: , Rfl:   MR BRAIN WO CONTRAST  Result Date: 09/08/2019 CLINICAL DATA:  Memory loss EXAM: MRI HEAD WITHOUT CONTRAST TECHNIQUE: Multiplanar, multiecho pulse sequences of the brain and surrounding structures were obtained without intravenous contrast. COMPARISON:  None. FINDINGS: BRAIN: No acute infarct, acute hemorrhage or extra-axial collection. Multifocal white matter hyperintensity, most commonly due to chronic ischemic microangiopathy. There is generalized atrophy without lobar predilection. Midline structures are normal. VASCULAR: Major flow voids are preserved. Single focus of chronic microhemorrhage in the right occipital lobe. SKULL AND UPPER CERVICAL SPINE: Normal calvarium and skull base. Visualized upper cervical spine and soft tissues are normal. SINUSES/ORBITS: Fluid levels in both maxillary sinuses. Normal orbits. IMPRESSION: 1. No acute intracranial abnormality. 2. Generalized atrophy and findings of chronic small vessel disease. Quantitative, volumetric MRI of the  brain may be helpful for more specific evaluation for characteristic atrophy patterns associated with dementia. 3. Bilateral maxillary sinus fluid levels. Correlate for symptoms of acute sinusitis. Electronically Signed   By: Ulyses Jarred M.D.   On: 09/08/2019 21:15    No images are attached to the  encounter.   CMP Latest Ref Rng & Units 08/07/2019  Glucose 70 - 99 mg/dL 80  BUN 8 - 23 mg/dL 10  Creatinine 0.44 - 1.00 mg/dL 2.96(H)  Sodium 135 - 145 mmol/L 135  Potassium 3.5 - 5.1 mmol/L 4.1  Chloride 98 - 111 mmol/L 100  CO2 22 - 32 mmol/L 24  Calcium 8.9 - 10.3 mg/dL 8.0(L)  Total Protein 6.5 - 8.1 g/dL 6.3(L)  Total Bilirubin 0.3 - 1.2 mg/dL 0.7  Alkaline Phos 38 - 126 U/L 59  AST 15 - 41 U/L 29  ALT 0 - 44 U/L <5   CBC Latest Ref Rng & Units 08/07/2019  WBC 4.0 - 10.5 K/uL 4.3  Hemoglobin 12.0 - 15.0 g/dL 7.5(L)  Hematocrit 36.0 - 46.0 % 23.6(L)  Platelets 150 - 400 K/uL 157    Assessment and plan: Patient is a 79 year old female referred for high ferritin  I reviewed the results of the blood work with patient's caregiver at the nursing home.  She was referred to me for high ferritin.  She does have significant anemia and her hemoglobin is typically around 8 and she has been receiving EPO through her dialysis.  Most recent H&H was 7.5/23.6.  B12 levels were normal.  Myeloma panel showed polyclonal increase in immunoglobulins but no monoclonal protein.  Haptoglobin was normal.  Serum free light chain ratio mildly abnormal at 2.18 likely secondary to CKD.  She also had further labs done at [facility] which showed iron saturation of 19%.  TIBC was low at 152.  ESR was elevated at 45.  Ferritin levels were greater than thousand.  Folic acid and TSH were normal.  Suspect high ferritin secondary to underlying multiple comorbidities and acute phase reactant.  Her iron saturation is normal and therefore this argues against iron overload.  Also patient is presently anemic with a hemoglobin of 7.5 and as such phlebotomy would not be recommended for high ferritin and not think that she would benefit from any chelating agents in the absence of iron overload either.  Follow-up instructions: I am inclined to monitor her blood work conservatively and we will plan to get repeat CBC in 1 month and  2 months and I will see her back in 2 months for a video visit.  If she still has [facility] we will try to get the blood work done through them instead of having the patient to come here for blood work  I discussed the assessment and treatment plan with the patient. The patient was provided an opportunity to ask questions and all were answered. The patient agreed with the plan and demonstrated an understanding of the instructions.   The patient was advised to call back or seek an in-person evaluation if the symptoms worsen or if the condition fails to improve as anticipated.    Visit Diagnosis: 1. History of anemia due to chronic kidney disease   2. Elevated ferritin     Dr. Randa Evens, MD, MPH [facility] Tel- [phone] 09/26/2019 2:25 PM

## 2019-09-26 NOTE — Telephone Encounter (Signed)
Follow up appts have been scheduled. Writer phoned transportation driver at [facility] and left voice mail with appt dates/times.

## 2019-09-27 DIAGNOSIS — D689 Coagulation defect, unspecified: Secondary | ICD-10-CM | POA: Diagnosis not present

## 2019-09-27 DIAGNOSIS — N2581 Secondary hyperparathyroidism of renal origin: Secondary | ICD-10-CM | POA: Diagnosis not present

## 2019-09-27 DIAGNOSIS — D631 Anemia in chronic kidney disease: Secondary | ICD-10-CM | POA: Diagnosis not present

## 2019-09-27 DIAGNOSIS — D509 Iron deficiency anemia, unspecified: Secondary | ICD-10-CM | POA: Diagnosis not present

## 2019-09-27 DIAGNOSIS — R2681 Unsteadiness on feet: Secondary | ICD-10-CM | POA: Diagnosis not present

## 2019-09-27 DIAGNOSIS — Z992 Dependence on renal dialysis: Secondary | ICD-10-CM | POA: Diagnosis not present

## 2019-09-27 DIAGNOSIS — M6281 Muscle weakness (generalized): Secondary | ICD-10-CM | POA: Diagnosis not present

## 2019-09-27 DIAGNOSIS — N186 End stage renal disease: Secondary | ICD-10-CM | POA: Diagnosis not present

## 2019-09-28 DIAGNOSIS — R2681 Unsteadiness on feet: Secondary | ICD-10-CM | POA: Diagnosis not present

## 2019-09-28 DIAGNOSIS — M6281 Muscle weakness (generalized): Secondary | ICD-10-CM | POA: Diagnosis not present

## 2019-09-29 DIAGNOSIS — D509 Iron deficiency anemia, unspecified: Secondary | ICD-10-CM | POA: Diagnosis not present

## 2019-09-29 DIAGNOSIS — D631 Anemia in chronic kidney disease: Secondary | ICD-10-CM | POA: Diagnosis not present

## 2019-09-29 DIAGNOSIS — N186 End stage renal disease: Secondary | ICD-10-CM | POA: Diagnosis not present

## 2019-09-29 DIAGNOSIS — D689 Coagulation defect, unspecified: Secondary | ICD-10-CM | POA: Diagnosis not present

## 2019-09-29 DIAGNOSIS — M6281 Muscle weakness (generalized): Secondary | ICD-10-CM | POA: Diagnosis not present

## 2019-09-29 DIAGNOSIS — N2581 Secondary hyperparathyroidism of renal origin: Secondary | ICD-10-CM | POA: Diagnosis not present

## 2019-09-29 DIAGNOSIS — R2681 Unsteadiness on feet: Secondary | ICD-10-CM | POA: Diagnosis not present

## 2019-09-29 DIAGNOSIS — Z992 Dependence on renal dialysis: Secondary | ICD-10-CM | POA: Diagnosis not present

## 2019-09-30 DIAGNOSIS — M6281 Muscle weakness (generalized): Secondary | ICD-10-CM | POA: Diagnosis not present

## 2019-09-30 DIAGNOSIS — R2681 Unsteadiness on feet: Secondary | ICD-10-CM | POA: Diagnosis not present

## 2019-10-01 DIAGNOSIS — N2581 Secondary hyperparathyroidism of renal origin: Secondary | ICD-10-CM | POA: Diagnosis not present

## 2019-10-01 DIAGNOSIS — D509 Iron deficiency anemia, unspecified: Secondary | ICD-10-CM | POA: Diagnosis not present

## 2019-10-01 DIAGNOSIS — D689 Coagulation defect, unspecified: Secondary | ICD-10-CM | POA: Diagnosis not present

## 2019-10-01 DIAGNOSIS — D631 Anemia in chronic kidney disease: Secondary | ICD-10-CM | POA: Diagnosis not present

## 2019-10-01 DIAGNOSIS — N186 End stage renal disease: Secondary | ICD-10-CM | POA: Diagnosis not present

## 2019-10-01 DIAGNOSIS — Z992 Dependence on renal dialysis: Secondary | ICD-10-CM | POA: Diagnosis not present

## 2019-10-02 DIAGNOSIS — N186 End stage renal disease: Secondary | ICD-10-CM | POA: Diagnosis not present

## 2019-10-02 DIAGNOSIS — Z992 Dependence on renal dialysis: Secondary | ICD-10-CM | POA: Diagnosis not present

## 2019-10-02 DIAGNOSIS — E1122 Type 2 diabetes mellitus with diabetic chronic kidney disease: Secondary | ICD-10-CM | POA: Diagnosis not present

## 2019-10-03 DIAGNOSIS — M6281 Muscle weakness (generalized): Secondary | ICD-10-CM | POA: Diagnosis not present

## 2019-10-03 DIAGNOSIS — R2681 Unsteadiness on feet: Secondary | ICD-10-CM | POA: Diagnosis not present

## 2019-10-03 DIAGNOSIS — R1311 Dysphagia, oral phase: Secondary | ICD-10-CM | POA: Diagnosis not present

## 2019-10-04 DIAGNOSIS — D631 Anemia in chronic kidney disease: Secondary | ICD-10-CM | POA: Diagnosis not present

## 2019-10-04 DIAGNOSIS — D689 Coagulation defect, unspecified: Secondary | ICD-10-CM | POA: Diagnosis not present

## 2019-10-04 DIAGNOSIS — R2681 Unsteadiness on feet: Secondary | ICD-10-CM | POA: Diagnosis not present

## 2019-10-04 DIAGNOSIS — R1311 Dysphagia, oral phase: Secondary | ICD-10-CM | POA: Diagnosis not present

## 2019-10-04 DIAGNOSIS — Z992 Dependence on renal dialysis: Secondary | ICD-10-CM | POA: Diagnosis not present

## 2019-10-04 DIAGNOSIS — E1122 Type 2 diabetes mellitus with diabetic chronic kidney disease: Secondary | ICD-10-CM | POA: Diagnosis not present

## 2019-10-04 DIAGNOSIS — D509 Iron deficiency anemia, unspecified: Secondary | ICD-10-CM | POA: Diagnosis not present

## 2019-10-04 DIAGNOSIS — M6281 Muscle weakness (generalized): Secondary | ICD-10-CM | POA: Diagnosis not present

## 2019-10-04 DIAGNOSIS — N2581 Secondary hyperparathyroidism of renal origin: Secondary | ICD-10-CM | POA: Diagnosis not present

## 2019-10-04 DIAGNOSIS — N186 End stage renal disease: Secondary | ICD-10-CM | POA: Diagnosis not present

## 2019-10-05 DIAGNOSIS — T50B95A Adverse effect of other viral vaccines, initial encounter: Secondary | ICD-10-CM | POA: Diagnosis not present

## 2019-10-05 DIAGNOSIS — R6883 Chills (without fever): Secondary | ICD-10-CM | POA: Diagnosis not present

## 2019-10-05 DIAGNOSIS — M6281 Muscle weakness (generalized): Secondary | ICD-10-CM | POA: Diagnosis not present

## 2019-10-05 DIAGNOSIS — R2681 Unsteadiness on feet: Secondary | ICD-10-CM | POA: Diagnosis not present

## 2019-10-05 DIAGNOSIS — Z23 Encounter for immunization: Secondary | ICD-10-CM | POA: Diagnosis not present

## 2019-10-05 DIAGNOSIS — R1311 Dysphagia, oral phase: Secondary | ICD-10-CM | POA: Diagnosis not present

## 2019-10-05 DIAGNOSIS — R5083 Postvaccination fever: Secondary | ICD-10-CM | POA: Diagnosis not present

## 2019-10-06 DIAGNOSIS — D631 Anemia in chronic kidney disease: Secondary | ICD-10-CM | POA: Diagnosis not present

## 2019-10-06 DIAGNOSIS — R1311 Dysphagia, oral phase: Secondary | ICD-10-CM | POA: Diagnosis not present

## 2019-10-06 DIAGNOSIS — Z992 Dependence on renal dialysis: Secondary | ICD-10-CM | POA: Diagnosis not present

## 2019-10-06 DIAGNOSIS — E1122 Type 2 diabetes mellitus with diabetic chronic kidney disease: Secondary | ICD-10-CM | POA: Diagnosis not present

## 2019-10-06 DIAGNOSIS — N2581 Secondary hyperparathyroidism of renal origin: Secondary | ICD-10-CM | POA: Diagnosis not present

## 2019-10-06 DIAGNOSIS — R2681 Unsteadiness on feet: Secondary | ICD-10-CM | POA: Diagnosis not present

## 2019-10-06 DIAGNOSIS — D509 Iron deficiency anemia, unspecified: Secondary | ICD-10-CM | POA: Diagnosis not present

## 2019-10-06 DIAGNOSIS — N186 End stage renal disease: Secondary | ICD-10-CM | POA: Diagnosis not present

## 2019-10-06 DIAGNOSIS — M6281 Muscle weakness (generalized): Secondary | ICD-10-CM | POA: Diagnosis not present

## 2019-10-06 DIAGNOSIS — D689 Coagulation defect, unspecified: Secondary | ICD-10-CM | POA: Diagnosis not present

## 2019-10-07 ENCOUNTER — Encounter (INDEPENDENT_AMBULATORY_CARE_PROVIDER_SITE_OTHER): Payer: Self-pay

## 2019-10-07 DIAGNOSIS — R2681 Unsteadiness on feet: Secondary | ICD-10-CM | POA: Diagnosis not present

## 2019-10-07 DIAGNOSIS — M6281 Muscle weakness (generalized): Secondary | ICD-10-CM | POA: Diagnosis not present

## 2019-10-07 DIAGNOSIS — R1311 Dysphagia, oral phase: Secondary | ICD-10-CM | POA: Diagnosis not present

## 2019-10-08 DIAGNOSIS — G3184 Mild cognitive impairment, so stated: Secondary | ICD-10-CM | POA: Diagnosis not present

## 2019-10-08 DIAGNOSIS — Z992 Dependence on renal dialysis: Secondary | ICD-10-CM | POA: Diagnosis not present

## 2019-10-08 DIAGNOSIS — E1122 Type 2 diabetes mellitus with diabetic chronic kidney disease: Secondary | ICD-10-CM | POA: Diagnosis not present

## 2019-10-08 DIAGNOSIS — D631 Anemia in chronic kidney disease: Secondary | ICD-10-CM | POA: Diagnosis not present

## 2019-10-08 DIAGNOSIS — E119 Type 2 diabetes mellitus without complications: Secondary | ICD-10-CM | POA: Diagnosis not present

## 2019-10-08 DIAGNOSIS — D509 Iron deficiency anemia, unspecified: Secondary | ICD-10-CM | POA: Diagnosis not present

## 2019-10-08 DIAGNOSIS — E559 Vitamin D deficiency, unspecified: Secondary | ICD-10-CM | POA: Diagnosis not present

## 2019-10-08 DIAGNOSIS — N186 End stage renal disease: Secondary | ICD-10-CM | POA: Diagnosis not present

## 2019-10-08 DIAGNOSIS — D689 Coagulation defect, unspecified: Secondary | ICD-10-CM | POA: Diagnosis not present

## 2019-10-08 DIAGNOSIS — D649 Anemia, unspecified: Secondary | ICD-10-CM | POA: Diagnosis not present

## 2019-10-08 DIAGNOSIS — N2581 Secondary hyperparathyroidism of renal origin: Secondary | ICD-10-CM | POA: Diagnosis not present

## 2019-10-10 DIAGNOSIS — M6281 Muscle weakness (generalized): Secondary | ICD-10-CM | POA: Diagnosis not present

## 2019-10-10 DIAGNOSIS — R1311 Dysphagia, oral phase: Secondary | ICD-10-CM | POA: Diagnosis not present

## 2019-10-10 DIAGNOSIS — R2681 Unsteadiness on feet: Secondary | ICD-10-CM | POA: Diagnosis not present

## 2019-10-11 ENCOUNTER — Other Ambulatory Visit (INDEPENDENT_AMBULATORY_CARE_PROVIDER_SITE_OTHER): Payer: Self-pay | Admitting: Nurse Practitioner

## 2019-10-11 ENCOUNTER — Telehealth (INDEPENDENT_AMBULATORY_CARE_PROVIDER_SITE_OTHER): Payer: Self-pay

## 2019-10-11 DIAGNOSIS — D631 Anemia in chronic kidney disease: Secondary | ICD-10-CM | POA: Diagnosis not present

## 2019-10-11 DIAGNOSIS — D689 Coagulation defect, unspecified: Secondary | ICD-10-CM | POA: Diagnosis not present

## 2019-10-11 DIAGNOSIS — M6281 Muscle weakness (generalized): Secondary | ICD-10-CM | POA: Diagnosis not present

## 2019-10-11 DIAGNOSIS — N2581 Secondary hyperparathyroidism of renal origin: Secondary | ICD-10-CM | POA: Diagnosis not present

## 2019-10-11 DIAGNOSIS — D509 Iron deficiency anemia, unspecified: Secondary | ICD-10-CM | POA: Diagnosis not present

## 2019-10-11 DIAGNOSIS — R2681 Unsteadiness on feet: Secondary | ICD-10-CM | POA: Diagnosis not present

## 2019-10-11 DIAGNOSIS — N186 End stage renal disease: Secondary | ICD-10-CM | POA: Diagnosis not present

## 2019-10-11 DIAGNOSIS — Z992 Dependence on renal dialysis: Secondary | ICD-10-CM | POA: Diagnosis not present

## 2019-10-11 DIAGNOSIS — R1311 Dysphagia, oral phase: Secondary | ICD-10-CM | POA: Diagnosis not present

## 2019-10-11 DIAGNOSIS — E1122 Type 2 diabetes mellitus with diabetic chronic kidney disease: Secondary | ICD-10-CM | POA: Diagnosis not present

## 2019-10-11 NOTE — Telephone Encounter (Signed)
Dwayne from [facility] called to reschedule the patient's left arm fistulagram and covid testing with Dr. Lucky Cowboy. Patient is now scheduled for 10/19/19 with a 9:30 am arrival time to the [facility]. Patient will do covid testing on 10/17/19 between 12:30-2:30 pm at the [facility]. Pre-procedure instructions will be faxed to [facility] per request.

## 2019-10-12 DIAGNOSIS — R2681 Unsteadiness on feet: Secondary | ICD-10-CM | POA: Diagnosis not present

## 2019-10-12 DIAGNOSIS — R1311 Dysphagia, oral phase: Secondary | ICD-10-CM | POA: Diagnosis not present

## 2019-10-12 DIAGNOSIS — M6281 Muscle weakness (generalized): Secondary | ICD-10-CM | POA: Diagnosis not present

## 2019-10-13 DIAGNOSIS — R1311 Dysphagia, oral phase: Secondary | ICD-10-CM | POA: Diagnosis not present

## 2019-10-13 DIAGNOSIS — E1122 Type 2 diabetes mellitus with diabetic chronic kidney disease: Secondary | ICD-10-CM | POA: Diagnosis not present

## 2019-10-13 DIAGNOSIS — D689 Coagulation defect, unspecified: Secondary | ICD-10-CM | POA: Diagnosis not present

## 2019-10-13 DIAGNOSIS — S31811D Laceration without foreign body of right buttock, subsequent encounter: Secondary | ICD-10-CM | POA: Diagnosis not present

## 2019-10-13 DIAGNOSIS — D509 Iron deficiency anemia, unspecified: Secondary | ICD-10-CM | POA: Diagnosis not present

## 2019-10-13 DIAGNOSIS — N186 End stage renal disease: Secondary | ICD-10-CM | POA: Diagnosis not present

## 2019-10-13 DIAGNOSIS — Z992 Dependence on renal dialysis: Secondary | ICD-10-CM | POA: Diagnosis not present

## 2019-10-13 DIAGNOSIS — N2581 Secondary hyperparathyroidism of renal origin: Secondary | ICD-10-CM | POA: Diagnosis not present

## 2019-10-13 DIAGNOSIS — M6281 Muscle weakness (generalized): Secondary | ICD-10-CM | POA: Diagnosis not present

## 2019-10-13 DIAGNOSIS — R2681 Unsteadiness on feet: Secondary | ICD-10-CM | POA: Diagnosis not present

## 2019-10-13 DIAGNOSIS — T8189XD Other complications of procedures, not elsewhere classified, subsequent encounter: Secondary | ICD-10-CM | POA: Diagnosis not present

## 2019-10-13 DIAGNOSIS — D631 Anemia in chronic kidney disease: Secondary | ICD-10-CM | POA: Diagnosis not present

## 2019-10-14 DIAGNOSIS — R2681 Unsteadiness on feet: Secondary | ICD-10-CM | POA: Diagnosis not present

## 2019-10-14 DIAGNOSIS — R1311 Dysphagia, oral phase: Secondary | ICD-10-CM | POA: Diagnosis not present

## 2019-10-14 DIAGNOSIS — M6281 Muscle weakness (generalized): Secondary | ICD-10-CM | POA: Diagnosis not present

## 2019-10-15 DIAGNOSIS — D689 Coagulation defect, unspecified: Secondary | ICD-10-CM | POA: Diagnosis not present

## 2019-10-15 DIAGNOSIS — N2581 Secondary hyperparathyroidism of renal origin: Secondary | ICD-10-CM | POA: Diagnosis not present

## 2019-10-15 DIAGNOSIS — D509 Iron deficiency anemia, unspecified: Secondary | ICD-10-CM | POA: Diagnosis not present

## 2019-10-15 DIAGNOSIS — N186 End stage renal disease: Secondary | ICD-10-CM | POA: Diagnosis not present

## 2019-10-15 DIAGNOSIS — D631 Anemia in chronic kidney disease: Secondary | ICD-10-CM | POA: Diagnosis not present

## 2019-10-15 DIAGNOSIS — Z992 Dependence on renal dialysis: Secondary | ICD-10-CM | POA: Diagnosis not present

## 2019-10-15 DIAGNOSIS — E1122 Type 2 diabetes mellitus with diabetic chronic kidney disease: Secondary | ICD-10-CM | POA: Diagnosis not present

## 2019-10-17 ENCOUNTER — Other Ambulatory Visit: Payer: Self-pay

## 2019-10-17 ENCOUNTER — Other Ambulatory Visit
Admission: RE | Admit: 2019-10-17 | Discharge: 2019-10-17 | Disposition: A | Discharge status: Home / Self Care | Payer: Medicare Other | Source: Ambulatory Visit | Attending: Vascular Surgery | Admitting: Vascular Surgery

## 2019-10-17 DIAGNOSIS — U071 COVID-19: Secondary | ICD-10-CM | POA: Insufficient documentation

## 2019-10-17 DIAGNOSIS — R2681 Unsteadiness on feet: Secondary | ICD-10-CM | POA: Diagnosis not present

## 2019-10-17 DIAGNOSIS — Z01812 Encounter for preprocedural laboratory examination: Secondary | ICD-10-CM | POA: Diagnosis not present

## 2019-10-17 DIAGNOSIS — R1311 Dysphagia, oral phase: Secondary | ICD-10-CM | POA: Diagnosis not present

## 2019-10-17 DIAGNOSIS — T8131XD Disruption of external operation (surgical) wound, not elsewhere classified, subsequent encounter: Secondary | ICD-10-CM | POA: Diagnosis not present

## 2019-10-17 DIAGNOSIS — M6281 Muscle weakness (generalized): Secondary | ICD-10-CM | POA: Diagnosis not present

## 2019-10-18 ENCOUNTER — Encounter: Payer: Self-pay | Admitting: Internal Medicine

## 2019-10-18 ENCOUNTER — Other Ambulatory Visit (INDEPENDENT_AMBULATORY_CARE_PROVIDER_SITE_OTHER): Payer: Self-pay | Admitting: Nurse Practitioner

## 2019-10-18 DIAGNOSIS — N186 End stage renal disease: Secondary | ICD-10-CM | POA: Diagnosis not present

## 2019-10-18 DIAGNOSIS — E1122 Type 2 diabetes mellitus with diabetic chronic kidney disease: Secondary | ICD-10-CM | POA: Diagnosis not present

## 2019-10-18 DIAGNOSIS — D631 Anemia in chronic kidney disease: Secondary | ICD-10-CM | POA: Diagnosis not present

## 2019-10-18 DIAGNOSIS — Z992 Dependence on renal dialysis: Secondary | ICD-10-CM | POA: Diagnosis not present

## 2019-10-18 DIAGNOSIS — N2581 Secondary hyperparathyroidism of renal origin: Secondary | ICD-10-CM | POA: Diagnosis not present

## 2019-10-18 DIAGNOSIS — D689 Coagulation defect, unspecified: Secondary | ICD-10-CM | POA: Diagnosis not present

## 2019-10-18 DIAGNOSIS — D509 Iron deficiency anemia, unspecified: Secondary | ICD-10-CM | POA: Diagnosis not present

## 2019-10-18 LAB — SARS CORONAVIRUS 2 (TAT 6-24 HRS): SARS Coronavirus 2: POSITIVE — AB

## 2019-10-18 NOTE — Progress Notes (Signed)
Received record of last office visit from [facility]. Placed on Dr Audrie Gallus desk.

## 2019-10-19 ENCOUNTER — Encounter: Admission: RE | Payer: Self-pay | Source: Home / Self Care

## 2019-10-19 ENCOUNTER — Ambulatory Visit: Admission: RE | Admit: 2019-10-19 | Payer: Medicare Other | Source: Home / Self Care | Admitting: Vascular Surgery

## 2019-10-19 ENCOUNTER — Telehealth: Payer: Self-pay | Admitting: Unknown Physician Specialty

## 2019-10-19 DIAGNOSIS — R1311 Dysphagia, oral phase: Secondary | ICD-10-CM | POA: Diagnosis not present

## 2019-10-19 DIAGNOSIS — R2681 Unsteadiness on feet: Secondary | ICD-10-CM | POA: Diagnosis not present

## 2019-10-19 DIAGNOSIS — M6281 Muscle weakness (generalized): Secondary | ICD-10-CM | POA: Diagnosis not present

## 2019-10-19 SURGERY — A/V FISTULAGRAM
Anesthesia: Moderate Sedation | Laterality: Left

## 2019-10-19 NOTE — Telephone Encounter (Signed)
Called to discuss with patient about Covid symptoms and the use of bamlanivimab, a monoclonal antibody infusion for those with mild to moderate Covid symptoms and at a high risk of hospitalization.  Pt is qualified for this infusion at the [facility] infusion center due to Age > 60   Unable to leave phone message.  Will leave mychart message

## 2019-10-20 DIAGNOSIS — Z992 Dependence on renal dialysis: Secondary | ICD-10-CM | POA: Diagnosis not present

## 2019-10-20 DIAGNOSIS — R1311 Dysphagia, oral phase: Secondary | ICD-10-CM | POA: Diagnosis not present

## 2019-10-20 DIAGNOSIS — N2581 Secondary hyperparathyroidism of renal origin: Secondary | ICD-10-CM | POA: Diagnosis not present

## 2019-10-20 DIAGNOSIS — R2681 Unsteadiness on feet: Secondary | ICD-10-CM | POA: Diagnosis not present

## 2019-10-20 DIAGNOSIS — D509 Iron deficiency anemia, unspecified: Secondary | ICD-10-CM | POA: Diagnosis not present

## 2019-10-20 DIAGNOSIS — D689 Coagulation defect, unspecified: Secondary | ICD-10-CM | POA: Diagnosis not present

## 2019-10-20 DIAGNOSIS — N186 End stage renal disease: Secondary | ICD-10-CM | POA: Diagnosis not present

## 2019-10-20 DIAGNOSIS — M6281 Muscle weakness (generalized): Secondary | ICD-10-CM | POA: Diagnosis not present

## 2019-10-20 DIAGNOSIS — E1122 Type 2 diabetes mellitus with diabetic chronic kidney disease: Secondary | ICD-10-CM | POA: Diagnosis not present

## 2019-10-20 DIAGNOSIS — D631 Anemia in chronic kidney disease: Secondary | ICD-10-CM | POA: Diagnosis not present

## 2019-10-21 DIAGNOSIS — R1311 Dysphagia, oral phase: Secondary | ICD-10-CM | POA: Diagnosis not present

## 2019-10-21 DIAGNOSIS — R2681 Unsteadiness on feet: Secondary | ICD-10-CM | POA: Diagnosis not present

## 2019-10-21 DIAGNOSIS — M6281 Muscle weakness (generalized): Secondary | ICD-10-CM | POA: Diagnosis not present

## 2019-10-22 DIAGNOSIS — N2581 Secondary hyperparathyroidism of renal origin: Secondary | ICD-10-CM | POA: Diagnosis not present

## 2019-10-22 DIAGNOSIS — D509 Iron deficiency anemia, unspecified: Secondary | ICD-10-CM | POA: Diagnosis not present

## 2019-10-22 DIAGNOSIS — D631 Anemia in chronic kidney disease: Secondary | ICD-10-CM | POA: Diagnosis not present

## 2019-10-22 DIAGNOSIS — R509 Fever, unspecified: Secondary | ICD-10-CM | POA: Diagnosis not present

## 2019-10-22 DIAGNOSIS — E1122 Type 2 diabetes mellitus with diabetic chronic kidney disease: Secondary | ICD-10-CM | POA: Diagnosis not present

## 2019-10-22 DIAGNOSIS — N186 End stage renal disease: Secondary | ICD-10-CM | POA: Diagnosis not present

## 2019-10-22 DIAGNOSIS — D689 Coagulation defect, unspecified: Secondary | ICD-10-CM | POA: Diagnosis not present

## 2019-10-22 DIAGNOSIS — Z992 Dependence on renal dialysis: Secondary | ICD-10-CM | POA: Diagnosis not present

## 2019-10-24 DIAGNOSIS — R1311 Dysphagia, oral phase: Secondary | ICD-10-CM | POA: Diagnosis not present

## 2019-10-24 DIAGNOSIS — R2681 Unsteadiness on feet: Secondary | ICD-10-CM | POA: Diagnosis not present

## 2019-10-24 DIAGNOSIS — M6281 Muscle weakness (generalized): Secondary | ICD-10-CM | POA: Diagnosis not present

## 2019-10-25 DIAGNOSIS — R509 Fever, unspecified: Secondary | ICD-10-CM | POA: Diagnosis not present

## 2019-10-25 DIAGNOSIS — N2581 Secondary hyperparathyroidism of renal origin: Secondary | ICD-10-CM | POA: Diagnosis not present

## 2019-10-25 DIAGNOSIS — Z992 Dependence on renal dialysis: Secondary | ICD-10-CM | POA: Diagnosis not present

## 2019-10-25 DIAGNOSIS — D509 Iron deficiency anemia, unspecified: Secondary | ICD-10-CM | POA: Diagnosis not present

## 2019-10-25 DIAGNOSIS — N186 End stage renal disease: Secondary | ICD-10-CM | POA: Diagnosis not present

## 2019-10-25 DIAGNOSIS — R2681 Unsteadiness on feet: Secondary | ICD-10-CM | POA: Diagnosis not present

## 2019-10-25 DIAGNOSIS — R1311 Dysphagia, oral phase: Secondary | ICD-10-CM | POA: Diagnosis not present

## 2019-10-25 DIAGNOSIS — D631 Anemia in chronic kidney disease: Secondary | ICD-10-CM | POA: Diagnosis not present

## 2019-10-25 DIAGNOSIS — M6281 Muscle weakness (generalized): Secondary | ICD-10-CM | POA: Diagnosis not present

## 2019-10-25 DIAGNOSIS — D689 Coagulation defect, unspecified: Secondary | ICD-10-CM | POA: Diagnosis not present

## 2019-10-25 DIAGNOSIS — G252 Other specified forms of tremor: Secondary | ICD-10-CM | POA: Diagnosis not present

## 2019-10-25 DIAGNOSIS — E1122 Type 2 diabetes mellitus with diabetic chronic kidney disease: Secondary | ICD-10-CM | POA: Diagnosis not present

## 2019-10-26 DIAGNOSIS — R1311 Dysphagia, oral phase: Secondary | ICD-10-CM | POA: Diagnosis not present

## 2019-10-26 DIAGNOSIS — M6281 Muscle weakness (generalized): Secondary | ICD-10-CM | POA: Diagnosis not present

## 2019-10-26 DIAGNOSIS — R2681 Unsteadiness on feet: Secondary | ICD-10-CM | POA: Diagnosis not present

## 2019-10-27 DIAGNOSIS — N2581 Secondary hyperparathyroidism of renal origin: Secondary | ICD-10-CM | POA: Diagnosis not present

## 2019-10-27 DIAGNOSIS — M6281 Muscle weakness (generalized): Secondary | ICD-10-CM | POA: Diagnosis not present

## 2019-10-27 DIAGNOSIS — L0889 Other specified local infections of the skin and subcutaneous tissue: Secondary | ICD-10-CM | POA: Diagnosis not present

## 2019-10-27 DIAGNOSIS — D631 Anemia in chronic kidney disease: Secondary | ICD-10-CM | POA: Diagnosis not present

## 2019-10-27 DIAGNOSIS — N186 End stage renal disease: Secondary | ICD-10-CM | POA: Diagnosis not present

## 2019-10-27 DIAGNOSIS — R1311 Dysphagia, oral phase: Secondary | ICD-10-CM | POA: Diagnosis not present

## 2019-10-27 DIAGNOSIS — S21209A Unspecified open wound of unspecified back wall of thorax without penetration into thoracic cavity, initial encounter: Secondary | ICD-10-CM | POA: Diagnosis not present

## 2019-10-27 DIAGNOSIS — R509 Fever, unspecified: Secondary | ICD-10-CM | POA: Diagnosis not present

## 2019-10-27 DIAGNOSIS — D509 Iron deficiency anemia, unspecified: Secondary | ICD-10-CM | POA: Diagnosis not present

## 2019-10-27 DIAGNOSIS — D689 Coagulation defect, unspecified: Secondary | ICD-10-CM | POA: Diagnosis not present

## 2019-10-27 DIAGNOSIS — Z992 Dependence on renal dialysis: Secondary | ICD-10-CM | POA: Diagnosis not present

## 2019-10-27 DIAGNOSIS — E1122 Type 2 diabetes mellitus with diabetic chronic kidney disease: Secondary | ICD-10-CM | POA: Diagnosis not present

## 2019-10-27 DIAGNOSIS — R2681 Unsteadiness on feet: Secondary | ICD-10-CM | POA: Diagnosis not present

## 2019-10-28 DIAGNOSIS — R1311 Dysphagia, oral phase: Secondary | ICD-10-CM | POA: Diagnosis not present

## 2019-10-28 DIAGNOSIS — M6281 Muscle weakness (generalized): Secondary | ICD-10-CM | POA: Diagnosis not present

## 2019-10-28 DIAGNOSIS — R2681 Unsteadiness on feet: Secondary | ICD-10-CM | POA: Diagnosis not present

## 2019-10-29 DIAGNOSIS — E1122 Type 2 diabetes mellitus with diabetic chronic kidney disease: Secondary | ICD-10-CM | POA: Diagnosis not present

## 2019-10-29 DIAGNOSIS — D689 Coagulation defect, unspecified: Secondary | ICD-10-CM | POA: Diagnosis not present

## 2019-10-29 DIAGNOSIS — D631 Anemia in chronic kidney disease: Secondary | ICD-10-CM | POA: Diagnosis not present

## 2019-10-29 DIAGNOSIS — D509 Iron deficiency anemia, unspecified: Secondary | ICD-10-CM | POA: Diagnosis not present

## 2019-10-29 DIAGNOSIS — Z992 Dependence on renal dialysis: Secondary | ICD-10-CM | POA: Diagnosis not present

## 2019-10-29 DIAGNOSIS — N2581 Secondary hyperparathyroidism of renal origin: Secondary | ICD-10-CM | POA: Diagnosis not present

## 2019-10-29 DIAGNOSIS — N186 End stage renal disease: Secondary | ICD-10-CM | POA: Diagnosis not present

## 2019-10-31 DIAGNOSIS — R1311 Dysphagia, oral phase: Secondary | ICD-10-CM | POA: Diagnosis not present

## 2019-10-31 DIAGNOSIS — M6281 Muscle weakness (generalized): Secondary | ICD-10-CM | POA: Diagnosis not present

## 2019-10-31 DIAGNOSIS — R2681 Unsteadiness on feet: Secondary | ICD-10-CM | POA: Diagnosis not present

## 2019-11-01 DIAGNOSIS — E1122 Type 2 diabetes mellitus with diabetic chronic kidney disease: Secondary | ICD-10-CM | POA: Diagnosis not present

## 2019-11-01 DIAGNOSIS — N186 End stage renal disease: Secondary | ICD-10-CM | POA: Diagnosis not present

## 2019-11-01 DIAGNOSIS — D689 Coagulation defect, unspecified: Secondary | ICD-10-CM | POA: Diagnosis not present

## 2019-11-01 DIAGNOSIS — R2681 Unsteadiness on feet: Secondary | ICD-10-CM | POA: Diagnosis not present

## 2019-11-01 DIAGNOSIS — R1311 Dysphagia, oral phase: Secondary | ICD-10-CM | POA: Diagnosis not present

## 2019-11-01 DIAGNOSIS — D509 Iron deficiency anemia, unspecified: Secondary | ICD-10-CM | POA: Diagnosis not present

## 2019-11-01 DIAGNOSIS — N2581 Secondary hyperparathyroidism of renal origin: Secondary | ICD-10-CM | POA: Diagnosis not present

## 2019-11-01 DIAGNOSIS — Z992 Dependence on renal dialysis: Secondary | ICD-10-CM | POA: Diagnosis not present

## 2019-11-01 DIAGNOSIS — D631 Anemia in chronic kidney disease: Secondary | ICD-10-CM | POA: Diagnosis not present

## 2019-11-01 DIAGNOSIS — M6281 Muscle weakness (generalized): Secondary | ICD-10-CM | POA: Diagnosis not present

## 2019-11-02 ENCOUNTER — Telehealth: Payer: Self-pay | Admitting: Internal Medicine

## 2019-11-02 ENCOUNTER — Encounter: Payer: Self-pay | Admitting: Internal Medicine

## 2019-11-02 DIAGNOSIS — R1311 Dysphagia, oral phase: Secondary | ICD-10-CM | POA: Diagnosis not present

## 2019-11-02 DIAGNOSIS — E1122 Type 2 diabetes mellitus with diabetic chronic kidney disease: Secondary | ICD-10-CM | POA: Diagnosis not present

## 2019-11-02 DIAGNOSIS — N186 End stage renal disease: Secondary | ICD-10-CM | POA: Diagnosis not present

## 2019-11-02 DIAGNOSIS — Z992 Dependence on renal dialysis: Secondary | ICD-10-CM | POA: Diagnosis not present

## 2019-11-02 DIAGNOSIS — R2681 Unsteadiness on feet: Secondary | ICD-10-CM | POA: Diagnosis not present

## 2019-11-02 DIAGNOSIS — M6281 Muscle weakness (generalized): Secondary | ICD-10-CM | POA: Diagnosis not present

## 2019-11-02 NOTE — Telephone Encounter (Signed)
Pt + covid 19 10/17/19 how is she feeling?

## 2019-11-03 ENCOUNTER — Other Ambulatory Visit: Payer: Self-pay

## 2019-11-03 ENCOUNTER — Emergency Department: Payer: Medicare Other

## 2019-11-03 ENCOUNTER — Inpatient Hospital Stay
Admission: EM | Admit: 2019-11-03 | Discharge: 2019-11-09 | DRG: 864 | Disposition: A | Discharge status: Skilled Nursing Facility | Payer: Medicare Other | Source: Skilled Nursing Facility | Attending: Hospitalist | Admitting: Hospitalist

## 2019-11-03 DIAGNOSIS — Z862 Personal history of diseases of the blood and blood-forming organs and certain disorders involving the immune mechanism: Secondary | ICD-10-CM

## 2019-11-03 DIAGNOSIS — Y838 Other surgical procedures as the cause of abnormal reaction of the patient, or of later complication, without mention of misadventure at the time of the procedure: Secondary | ICD-10-CM | POA: Diagnosis present

## 2019-11-03 DIAGNOSIS — E039 Hypothyroidism, unspecified: Secondary | ICD-10-CM

## 2019-11-03 DIAGNOSIS — Z833 Family history of diabetes mellitus: Secondary | ICD-10-CM | POA: Diagnosis not present

## 2019-11-03 DIAGNOSIS — N186 End stage renal disease: Secondary | ICD-10-CM | POA: Diagnosis present

## 2019-11-03 DIAGNOSIS — Z8719 Personal history of other diseases of the digestive system: Secondary | ICD-10-CM

## 2019-11-03 DIAGNOSIS — Z79890 Hormone replacement therapy (postmenopausal): Secondary | ICD-10-CM

## 2019-11-03 DIAGNOSIS — R509 Fever, unspecified: Principal | ICD-10-CM | POA: Diagnosis present

## 2019-11-03 DIAGNOSIS — Z7401 Bed confinement status: Secondary | ICD-10-CM | POA: Diagnosis not present

## 2019-11-03 DIAGNOSIS — Z79899 Other long term (current) drug therapy: Secondary | ICD-10-CM

## 2019-11-03 DIAGNOSIS — T8189XA Other complications of procedures, not elsewhere classified, initial encounter: Secondary | ICD-10-CM | POA: Diagnosis present

## 2019-11-03 DIAGNOSIS — Z9049 Acquired absence of other specified parts of digestive tract: Secondary | ICD-10-CM | POA: Diagnosis not present

## 2019-11-03 DIAGNOSIS — Z841 Family history of disorders of kidney and ureter: Secondary | ICD-10-CM

## 2019-11-03 DIAGNOSIS — N2581 Secondary hyperparathyroidism of renal origin: Secondary | ICD-10-CM | POA: Diagnosis not present

## 2019-11-03 DIAGNOSIS — Z743 Need for continuous supervision: Secondary | ICD-10-CM | POA: Diagnosis not present

## 2019-11-03 DIAGNOSIS — E1122 Type 2 diabetes mellitus with diabetic chronic kidney disease: Secondary | ICD-10-CM | POA: Diagnosis present

## 2019-11-03 DIAGNOSIS — R410 Disorientation, unspecified: Secondary | ICD-10-CM | POA: Diagnosis not present

## 2019-11-03 DIAGNOSIS — Z95828 Presence of other vascular implants and grafts: Secondary | ICD-10-CM | POA: Diagnosis not present

## 2019-11-03 DIAGNOSIS — I120 Hypertensive chronic kidney disease with stage 5 chronic kidney disease or end stage renal disease: Secondary | ICD-10-CM | POA: Diagnosis not present

## 2019-11-03 DIAGNOSIS — Z981 Arthrodesis status: Secondary | ICD-10-CM

## 2019-11-03 DIAGNOSIS — M2550 Pain in unspecified joint: Secondary | ICD-10-CM | POA: Diagnosis not present

## 2019-11-03 DIAGNOSIS — Z832 Family history of diseases of the blood and blood-forming organs and certain disorders involving the immune mechanism: Secondary | ICD-10-CM | POA: Diagnosis not present

## 2019-11-03 DIAGNOSIS — Z20822 Contact with and (suspected) exposure to covid-19: Secondary | ICD-10-CM | POA: Diagnosis present

## 2019-11-03 DIAGNOSIS — Z992 Dependence on renal dialysis: Secondary | ICD-10-CM | POA: Diagnosis not present

## 2019-11-03 DIAGNOSIS — R404 Transient alteration of awareness: Secondary | ICD-10-CM | POA: Diagnosis not present

## 2019-11-03 DIAGNOSIS — Z90710 Acquired absence of both cervix and uterus: Secondary | ICD-10-CM

## 2019-11-03 DIAGNOSIS — I10 Essential (primary) hypertension: Secondary | ICD-10-CM | POA: Diagnosis not present

## 2019-11-03 DIAGNOSIS — N189 Chronic kidney disease, unspecified: Secondary | ICD-10-CM

## 2019-11-03 DIAGNOSIS — Z8616 Personal history of COVID-19: Secondary | ICD-10-CM

## 2019-11-03 DIAGNOSIS — G9341 Metabolic encephalopathy: Secondary | ICD-10-CM | POA: Diagnosis not present

## 2019-11-03 DIAGNOSIS — Z8249 Family history of ischemic heart disease and other diseases of the circulatory system: Secondary | ICD-10-CM | POA: Diagnosis not present

## 2019-11-03 DIAGNOSIS — Z888 Allergy status to other drugs, medicaments and biological substances status: Secondary | ICD-10-CM

## 2019-11-03 DIAGNOSIS — Z8619 Personal history of other infectious and parasitic diseases: Secondary | ICD-10-CM

## 2019-11-03 DIAGNOSIS — F0390 Unspecified dementia without behavioral disturbance: Secondary | ICD-10-CM | POA: Diagnosis present

## 2019-11-03 DIAGNOSIS — R4182 Altered mental status, unspecified: Secondary | ICD-10-CM | POA: Diagnosis not present

## 2019-11-03 DIAGNOSIS — D631 Anemia in chronic kidney disease: Secondary | ICD-10-CM | POA: Diagnosis not present

## 2019-11-03 DIAGNOSIS — D72829 Elevated white blood cell count, unspecified: Secondary | ICD-10-CM | POA: Diagnosis present

## 2019-11-03 DIAGNOSIS — R0689 Other abnormalities of breathing: Secondary | ICD-10-CM | POA: Diagnosis not present

## 2019-11-03 DIAGNOSIS — I959 Hypotension, unspecified: Secondary | ICD-10-CM | POA: Diagnosis not present

## 2019-11-03 DIAGNOSIS — Z91011 Allergy to milk products: Secondary | ICD-10-CM | POA: Diagnosis not present

## 2019-11-03 LAB — COMPREHENSIVE METABOLIC PANEL
ALT: 11 U/L (ref 0–44)
AST: 30 U/L (ref 15–41)
Albumin: 2 g/dL — ABNORMAL LOW (ref 3.5–5.0)
Alkaline Phosphatase: 79 U/L (ref 38–126)
Anion gap: 12 (ref 5–15)
BUN: 60 mg/dL — ABNORMAL HIGH (ref 8–23)
CO2: 23 mmol/L (ref 22–32)
Calcium: 8.2 mg/dL — ABNORMAL LOW (ref 8.9–10.3)
Chloride: 97 mmol/L — ABNORMAL LOW (ref 98–111)
Creatinine, Ser: 4.57 mg/dL — ABNORMAL HIGH (ref 0.44–1.00)
GFR calc Af Amer: 10 mL/min — ABNORMAL LOW (ref >60)
GFR calc non Af Amer: 9 mL/min — ABNORMAL LOW (ref >60)
Glucose, Bld: 94 mg/dL (ref 70–99)
Potassium: 4.8 mmol/L (ref 3.5–5.1)
Sodium: 132 mmol/L — ABNORMAL LOW (ref 135–145)
Total Bilirubin: 0.7 mg/dL (ref 0.3–1.2)
Total Protein: 6.4 g/dL — ABNORMAL LOW (ref 6.5–8.1)

## 2019-11-03 LAB — PROCALCITONIN: Procalcitonin: 1.5 ng/mL

## 2019-11-03 LAB — CBC WITH DIFFERENTIAL/PLATELET
Abs Immature Granulocytes: 0.24 10*3/uL — ABNORMAL HIGH (ref 0.00–0.07)
Basophils Absolute: 0 10*3/uL (ref 0.0–0.1)
Basophils Relative: 0 %
Eosinophils Absolute: 0 10*3/uL (ref 0.0–0.5)
Eosinophils Relative: 0 %
HCT: 28 % — ABNORMAL LOW (ref 36.0–46.0)
Hemoglobin: 8.5 g/dL — ABNORMAL LOW (ref 12.0–15.0)
Immature Granulocytes: 2 %
Lymphocytes Relative: 6 %
Lymphs Abs: 0.9 10*3/uL (ref 0.7–4.0)
MCH: 27.7 pg (ref 26.0–34.0)
MCHC: 30.4 g/dL (ref 30.0–36.0)
MCV: 91.2 fL (ref 80.0–100.0)
Monocytes Absolute: 0.7 10*3/uL (ref 0.1–1.0)
Monocytes Relative: 5 %
Neutro Abs: 14.5 10*3/uL — ABNORMAL HIGH (ref 1.7–7.7)
Neutrophils Relative %: 87 %
Platelets: 183 10*3/uL (ref 150–400)
RBC: 3.07 MIL/uL — ABNORMAL LOW (ref 3.87–5.11)
RDW: 18 % — ABNORMAL HIGH (ref 11.5–15.5)
WBC: 16.5 10*3/uL — ABNORMAL HIGH (ref 4.0–10.5)
nRBC: 0.2 % (ref 0.0–0.2)

## 2019-11-03 LAB — PHOSPHORUS: Phosphorus: 3.7 mg/dL (ref 2.5–4.6)

## 2019-11-03 LAB — RESPIRATORY PANEL BY RT PCR (FLU A&B, COVID)
Influenza A by PCR: NEGATIVE
Influenza B by PCR: NEGATIVE
SARS Coronavirus 2 by RT PCR: NEGATIVE

## 2019-11-03 LAB — LACTIC ACID, PLASMA: Lactic Acid, Venous: 1.1 mmol/L (ref 0.5–1.9)

## 2019-11-03 MED ORDER — RISAQUAD PO CAPS
1.0000 | ORAL_CAPSULE | Freq: Two times a day (BID) | ORAL | Status: DC
  Administered 2019-11-03 – 2019-11-09 (×12): 1 via ORAL
  Filled 2019-11-03 (×12): qty 1

## 2019-11-03 MED ORDER — DOCUSATE SODIUM 100 MG PO CAPS
100.0000 mg | ORAL_CAPSULE | Freq: Two times a day (BID) | ORAL | Status: DC
  Administered 2019-11-03 – 2019-11-09 (×9): 100 mg via ORAL
  Filled 2019-11-03 (×10): qty 1

## 2019-11-03 MED ORDER — HEPARIN SODIUM (PORCINE) 5000 UNIT/ML IJ SOLN
5000.0000 [IU] | Freq: Three times a day (TID) | INTRAMUSCULAR | Status: DC
  Administered 2019-11-03 – 2019-11-09 (×17): 5000 [IU] via SUBCUTANEOUS
  Filled 2019-11-03 (×18): qty 1

## 2019-11-03 MED ORDER — SODIUM CHLORIDE 0.9 % IV SOLN: Freq: Once | INTRAVENOUS | Status: AC

## 2019-11-03 MED ORDER — ONDANSETRON HCL 4 MG PO TABS: 4.0000 mg | ORAL_TABLET | Freq: Four times a day (QID) | ORAL | Status: DC | PRN

## 2019-11-03 MED ORDER — MECLIZINE HCL 12.5 MG PO TABS
12.5000 mg | ORAL_TABLET | Freq: Every day | ORAL | Status: DC
  Administered 2019-11-04 – 2019-11-09 (×6): 12.5 mg via ORAL
  Filled 2019-11-03: qty 1
  Filled 2019-11-03: qty 0.5
  Filled 2019-11-03: qty 1
  Filled 2019-11-03: qty 0.5
  Filled 2019-11-03 (×2): qty 1

## 2019-11-03 MED ORDER — ACETAMINOPHEN 500 MG PO TABS
500.0000 mg | ORAL_TABLET | Freq: Every day | ORAL | Status: DC | PRN
  Administered 2019-11-03: 500 mg via ORAL
  Filled 2019-11-03: qty 1

## 2019-11-03 MED ORDER — ZINC OXIDE 40 % EX OINT
1.0000 "application " | TOPICAL_OINTMENT | Freq: Three times a day (TID) | CUTANEOUS | Status: DC
  Administered 2019-11-03 – 2019-11-09 (×16): 1 via TOPICAL
  Filled 2019-11-03: qty 113

## 2019-11-03 MED ORDER — DIVALPROEX SODIUM 125 MG PO CSDR
125.0000 mg | DELAYED_RELEASE_CAPSULE | Freq: Two times a day (BID) | ORAL | Status: DC
  Administered 2019-11-03 – 2019-11-09 (×12): 125 mg via ORAL
  Filled 2019-11-03 (×13): qty 1

## 2019-11-03 MED ORDER — FOLIC ACID 1 MG PO TABS
1000.0000 ug | ORAL_TABLET | Freq: Every day | ORAL | Status: DC
  Administered 2019-11-04 – 2019-11-09 (×6): 1 mg via ORAL
  Filled 2019-11-03 (×6): qty 1

## 2019-11-03 MED ORDER — SODIUM CHLORIDE 0.9 % IV SOLN
2.0000 g | Freq: Once | INTRAVENOUS | Status: AC
  Administered 2019-11-03: 2 g via INTRAVENOUS
  Filled 2019-11-03: qty 2

## 2019-11-03 MED ORDER — NEPRO/CARBSTEADY PO LIQD
237.0000 mL | ORAL | Status: DC
  Administered 2019-11-04 – 2019-11-09 (×6): 237 mL via ORAL

## 2019-11-03 MED ORDER — SEVELAMER CARBONATE 800 MG PO TABS
800.0000 mg | ORAL_TABLET | Freq: Three times a day (TID) | ORAL | Status: DC
  Administered 2019-11-04 – 2019-11-09 (×14): 800 mg via ORAL
  Filled 2019-11-03 (×14): qty 1

## 2019-11-03 MED ORDER — LEVOTHYROXINE SODIUM 100 MCG PO TABS
100.0000 ug | ORAL_TABLET | Freq: Every day | ORAL | Status: DC
  Administered 2019-11-04 – 2019-11-09 (×6): 100 ug via ORAL
  Filled 2019-11-03: qty 1
  Filled 2019-11-03: qty 2
  Filled 2019-11-03: qty 1
  Filled 2019-11-03: qty 2
  Filled 2019-11-03 (×2): qty 1

## 2019-11-03 MED ORDER — SODIUM CHLORIDE 0.9 % IV SOLN
1.0000 g | INTRAVENOUS | Status: DC
  Administered 2019-11-04: 1 g via INTRAVENOUS
  Filled 2019-11-03 (×2): qty 1

## 2019-11-03 MED ORDER — VANCOMYCIN HCL IN DEXTROSE 1-5 GM/200ML-% IV SOLN
1000.0000 mg | INTRAVENOUS | Status: DC
  Filled 2019-11-03: qty 200

## 2019-11-03 MED ORDER — VANCOMYCIN HCL 750 MG/150ML IV SOLN
750.0000 mg | Freq: Once | INTRAVENOUS | Status: AC
  Administered 2019-11-03: 750 mg via INTRAVENOUS
  Filled 2019-11-03: qty 150

## 2019-11-03 MED ORDER — LABETALOL HCL 100 MG PO TABS
100.0000 mg | ORAL_TABLET | Freq: Two times a day (BID) | ORAL | Status: DC
  Administered 2019-11-03 – 2019-11-09 (×9): 100 mg via ORAL
  Filled 2019-11-03 (×13): qty 1

## 2019-11-03 MED ORDER — ATORVASTATIN CALCIUM 20 MG PO TABS
40.0000 mg | ORAL_TABLET | Freq: Every day | ORAL | Status: DC
  Administered 2019-11-04 – 2019-11-07 (×4): 40 mg via ORAL
  Filled 2019-11-03 (×4): qty 2

## 2019-11-03 MED ORDER — TRAMADOL HCL 50 MG PO TABS
25.0000 mg | ORAL_TABLET | Freq: Four times a day (QID) | ORAL | Status: DC | PRN
  Administered 2019-11-06 (×2): 25 mg via ORAL
  Filled 2019-11-03 (×2): qty 1

## 2019-11-03 MED ORDER — VANCOMYCIN HCL IN DEXTROSE 1-5 GM/200ML-% IV SOLN
1000.0000 mg | Freq: Once | INTRAVENOUS | Status: AC
  Administered 2019-11-03: 1000 mg via INTRAVENOUS
  Filled 2019-11-03: qty 200

## 2019-11-03 MED ORDER — ONDANSETRON HCL 4 MG/2ML IJ SOLN: 4.0000 mg | Freq: Four times a day (QID) | INTRAMUSCULAR | Status: DC | PRN

## 2019-11-03 NOTE — Consult Note (Signed)
PHARMACY -  BRIEF ANTIBIOTIC NOTE   Pharmacy has received consult(s) for Vancomycin/Cefepime from an ED provider.  The patient's profile has been reviewed for ht/wt/allergies/indication/available labs.    One time order(s) placed for Cefepime 2g IV x 1 and Vancomycin 1000mg  IV x 1 by ED provider   - will finish Vancomycin loading dose with 750mg  IV x 1 for a total load of 1750mg .  Further antibiotics/pharmacy consults should be ordered by admitting physician if indicated.                       Thank you,  Lu Duffel, PharmD, BCPS Clinical Pharmacist 11/03/2019 9:22 AM

## 2019-11-03 NOTE — H&P (Signed)
History and Physical    Taylor Tyler:[id number] DOB: 11/16/1940 DOA: 11/03/2019  PCP: McLean-Scocuzza, Nino Glow, MD   Patient coming from: Skilled nursing facility  I have personally briefly reviewed patient's old medical records in [place]  Chief Complaint: Fever  HPI: Taylor Tyler is a 79 y.o. female with medical history significant for end-stage renal disease on dialysis (T/TH/S), Covid + in December 2020, and on repeat testing done on 10/17/2019 for a procedure which was positive but technically within 90 days of her prior positive test), history of dementia and diabetes mellitus who was sent to the emergency room for evaluation of fever and confusion above her baseline.  Patient received her second COVID-19 vaccine on 11/02/19 and this morning was noted to be febrile with a temp of 102.48F.  She was said to be less interactive than normal.  At baseline she is normally awake, alert and oriented x 2 but this morning she is lethargic and less responsive.  Her repeat temperature in the emergency room was 100.77F. Unable to do a review of systems due to patient's underlying dementia and lethargy  ED Course: 79 y.o. female who presents to the ED for fever and  increased confusion above her baseline dementia status post her second COVID-19 vaccine.  She was febrile on arrival to the ER but did not have tachycardia and was not hypotensive.  White cell count was elevated at 16K.  Lactate level was within normal limits and procalcitonin was 1.5.  She received a dose of vancomycin and cefepime in the ER  Review of Systems: As per HPI otherwise 10 point review of systems negative.    Past Medical History:  Diagnosis Date  . Acute blood loss anemia 03/14/2017  . Acute renal failure superimposed on stage 3 chronic kidney disease ([place]) 03/14/2017  . AKI (acute kidney injury) ([place]) 02/28/2017  . Altered mental status 03/29/2017  . Anemia   . Aortic atherosclerosis ([place])   . Arthritis    "joints  might ache at times; not that bad" (06/10/2018)  . Bilateral lower extremity edema 03/04/2017  . Bradycardia   . Chronic kidney disease    ?? renal insufficiency,   . CKD (chronic kidney disease) stage 3, GFR 30-59 ml/min 01/15/2017   ?? renal insufficiency, which she thinks is coming from "all these medications"  . COVID-19    10/17/19  . Dementia ([place])   . Diabetes mellitus without complication ([place])    diagnosed 4-5 yrs ago, 06/21/18- "that was years ago"  . Disease of pancreas   . Diverticulitis    s/p perforation and partial colectomy 01/27/14 with 3 benign lymph nodes   . Diverticulosis 01/15/2017  . DJD (degenerative joint disease)   . DM (diabetes mellitus), type 2 with renal complications ([place]) 2/59/5638  . Elevated ferritin level   . Fatty liver   . Hypertension   . Hypothyroidism    "had radiation" (06/10/2018)  . Kidney disease, chronic, stage V (GFR under 15 ml/min) ([place])    per notes from nursing home-she is on dialysis  . Kidney stone   . Lethargy 02/28/2017  . Obesity, Class III, BMI 40-49.9 (morbid obesity) ([place]) 02/27/2017  . Pleural lipoma   . Postoperative wound infection 04/02/2017  . Spinal stenosis of lumbar region 01/15/2017  . Status post lumbar surgery   . Wound healing, delayed    back    Past Surgical History:  Procedure Laterality Date  . ABDOMINAL EXPOSURE N/A 02/24/2017  Procedure: ABDOMINAL EXPOSURE;  Surgeon: Angelia Mould, MD;  Location: [facility];  Service: Vascular;  Laterality: N/A;  . ABDOMINAL HYSTERECTOMY  1987   no h/o abnormal paps   . ANTERIOR LAT LUMBAR FUSION N/A 02/24/2017   Procedure: Lumbar three- five Anterior lateral lumbar interbody fusion;  Surgeon: Ditty, Kevan Ny, MD;  Location: [facility];  Service: Neurosurgery;  Laterality: N/A;  L3-5 Anterior lateral lumbar interbody fusion with removal of coflex at L3-4, L4-5  . ANTERIOR LUMBAR FUSION N/A 02/24/2017   Procedure: Stage 1: Lumbar five-Sacral one Anterior lumbar interbody  fusion;  Surgeon: Ditty, Kevan Ny, MD;  Location: [facility];  Service: Neurosurgery;  Laterality: N/A;  Stage 1: L5-S1 Anterior lumbar interbody fusion  . APPENDECTOMY    . APPLICATION OF ROBOTIC ASSISTANCE FOR SPINAL PROCEDURE N/A 02/26/2017   Procedure: APPLICATION OF ROBOTIC ASSISTANCE FOR SPINAL PROCEDURE;  Surgeon: Ditty, Kevan Ny, MD;  Location: [facility];  Service: Neurosurgery;  Laterality: N/A;  . APPLICATION OF WOUND VAC N/A 03/30/2017   Procedure: APPLICATION OF WOUND VAC;  Surgeon: Ditty, Kevan Ny, MD;  Location: [facility];  Service: Neurosurgery;  Laterality: N/A;  . APPLICATION OF WOUND VAC N/A 06/23/2019   Procedure: APPLICATION OF WOUND VAC;  Surgeon: Consuella Lose, MD;  Location: [facility];  Service: Neurosurgery;  Laterality: N/A;  . AV FISTULA PLACEMENT Left 06/08/2019   Procedure: Arteriovenous (Av) Fistula Creation;  Surgeon: Rosetta Posner, MD;  Location: [facility];  Service: Vascular;  Laterality: Left;  . BACK SURGERY  2013  . CATARACT EXTRACTION W/ INTRAOCULAR LENS IMPLANT Right   . CHOLECYSTECTOMY OPEN  1982  . COLON SURGERY  01/26/2014   desc.sigmoid colectomy and ventral hernia repair and splenic flexure mobilization  . COLON SURGERY    . DILATION AND CURETTAGE OF UTERUS    . HERNIA REPAIR    . INSERTION OF DIALYSIS CATHETER Right 06/08/2019   Procedure: INSERTION OF DIALYSIS CATHETER RIGHT INTERNAL JUGULAR;  Surgeon: Rosetta Posner, MD;  Location: [facility];  Service: Vascular;  Laterality: Right;  . LUMBAR LAMINECTOMY WITH SPINOUS PROCESS PLATE 2 LEVEL N/A 2/70/6237   Procedure: LUMBAR LAMINECTOMY/DECOMPRESSION MICRODISCECTOMY CoFlex;  Surgeon: Faythe Ghee, MD;  Location: [facility];  Service: Neurosurgery;  Laterality: N/A;  Lumbar three-four,Lumbar Four-Five Laminectomy with Coflex  . LUMBAR WOUND DEBRIDEMENT N/A 03/30/2017   Procedure: Lumbar wound exploration/debridement, placement of wound vac;  Surgeon: Ditty, Kevan Ny, MD;  Location: [facility];  Service:  Neurosurgery;  Laterality: N/A;  Lumbar wound exploration/debridement, placement of wound vac  . LUMBAR WOUND DEBRIDEMENT N/A 06/06/2018   Procedure: LUMBAR WOUND DEBRIDEMENT/EXPLORATION;  Surgeon: Consuella Lose, MD;  Location: [facility];  Service: Neurosurgery;  Laterality: N/A;  . LUMBAR WOUND DEBRIDEMENT N/A 06/22/2018   Procedure: SIMPLE INCISION AND DRAINAGE OF WOUND, APPLICATION OF WOUND VAC;  Surgeon: Consuella Lose, MD;  Location: [facility];  Service: Neurosurgery;  Laterality: N/A;  . LUMBAR WOUND DEBRIDEMENT N/A 06/23/2019   Procedure: LUMBAR WOUND DEBRIDEMENT WITH HARDWARE REMOVAL;  Surgeon: Consuella Lose, MD;  Location: [facility];  Service: Neurosurgery;  Laterality: N/A;  . PARTIAL COLECTOMY     01/27/14 diverticulitis and 3 benign lymph nodes [facility] Dr. Pat Patrick   . REDUCTION MAMMAPLASTY Bilateral 1992  . THROMBECTOMY BRACHIAL ARTERY Left 06/08/2019   Procedure: Thrombectomy Brachial Artery;  Surgeon: Rosetta Posner, MD;  Location: [facility];  Service: Vascular;  Laterality: Left;  . TUBAL LIGATION    . VENTRAL HERNIA REPAIR  2014  reports that she has never smoked. She has never used smokeless tobacco. She reports previous alcohol use. She reports that she does not use drugs.  Allergies  Allergen Reactions  . Clams [Shellfish Allergy] Swelling and Other (See Comments)    THROAT SWELLS NECK TURNS RED  . Hydralazine Other (See Comments)    CHEST TIGHTNESS Patient has tolerated multiple doses of hydralazine since allergy was listed   . Norvasc [Amlodipine Besylate] Swelling    Leg edema   . Milk-Related Compounds Diarrhea    Family History  Problem Relation Age of Onset  . Diabetes Father   . Heart attack Father   . Hypertension Father   . Heart disease Father   . Kidney disease Father   . Diabetes Mother   . Hypertension Mother   . Heart disease Mother   . Kidney disease Mother   . Lupus Sister   . Heart disease Brother   . Diabetes Brother   . Heart disease Brother     . Diabetes Brother   . Lupus Sister      Prior to Admission medications   Medication Sig Start Date End Date Taking? Authorizing Provider  acetaminophen (TYLENOL) 500 MG tablet Take 500 mg by mouth daily as needed for mild pain.    Yes [provider]  atorvastatin (LIPITOR) 40 MG tablet Take 1 tablet (40 mg total) by mouth daily at 6 PM. 09/03/18  Yes McLean-Scocuzza, Nino Glow, MD  benzonatate (TESSALON PERLES) 100 MG capsule Take 1 capsule (100 mg total) by mouth 3 (three) times daily as needed for cough. 08/07/19 08/06/20 Yes Earleen Newport, MD  citalopram (CELEXA) 10 MG tablet Take 10 mg by mouth daily.   Yes [provider]  divalproex (DEPAKOTE SPRINKLE) 125 MG capsule Take 125 mg by mouth 2 (two) times daily.    Yes [provider]  docusate sodium (COLACE) 100 MG capsule Take 100 mg by mouth 2 (two) times daily.   Yes [provider]  donepezil (ARICEPT) 5 MG tablet Take 5 mg by mouth at bedtime.   Yes [provider]  doxycycline (VIBRAMYCIN) 100 MG capsule Take 100 mg by mouth 2 (two) times daily. 10/27/19  Yes [provider]  folic acid (FOLVITE) 597 MCG tablet Take 400 mcg by mouth daily.   Yes [provider]  gabapentin (NEURONTIN) 100 MG capsule Take 2 capsules (200 mg total) by mouth at bedtime. 07/04/19  Yes Regalado, Belkys A, MD  labetalol (NORMODYNE) 100 MG tablet Take 1 tablet (100 mg total) by mouth 2 (two) times daily. 07/08/19  Yes Irene Pap N, DO  Lactobacillus (ACIDOPHILUS PO) Take 175 mg by mouth 2 (two) times daily.   Yes [provider]  lactulose, encephalopathy, (GENERLAC) 10 GM/15ML SOLN Take 10 g by mouth daily.   Yes [provider]  levothyroxine (SYNTHROID, LEVOTHROID) 100 MCG tablet Take 100 mcg by mouth daily before breakfast.   Yes [provider]  LORazepam (ATIVAN PO) Apply 0.5 mg/mL topically 2 (two) times daily as needed. 1 ml to skin   Yes [provider]  meclizine (ANTIVERT) 12.5 MG tablet Take 12.5 mg by mouth daily.   Yes [provider]  memantine (NAMENDA) 5 MG tablet Take 5 mg by mouth 2 (two) times daily.   Yes [provider]  Nutritional Supplements (FEEDING SUPPLEMENT, NEPRO CARB STEADY,) LIQD Take 237 mLs by mouth daily.   Yes [provider]  risperiDONE (RISPERDAL) 0.25 MG  tablet Take 0.25 mg by mouth 2 (two) times daily.   Yes [provider]  sevelamer carbonate (RENVELA) 800 MG tablet Take 1 tablet (800 mg total) by mouth 3 (three) times daily with meals. 07/04/19  Yes Regalado, Belkys A, MD  traMADol (ULTRAM) 50 MG tablet Take 25 mg by mouth every 6 (six) hours as needed.    Yes [provider]  zinc oxide 20 % ointment Apply 1 application topically in the morning, at noon, and at bedtime.   Yes [provider]    Physical Exam: Vitals:   11/03/19 1000 11/03/19 1030 11/03/19 1045 11/03/19 1100  BP: (!) 134/39 (!) 123/35  (!) 107/35  Pulse: 69 66 67   Resp: (!) 23 (!) 22 (!) 23   Temp:      TempSrc:      SpO2: 91% 95% 96%   Weight:      Height:         Vitals:   11/03/19 1000 11/03/19 1030 11/03/19 1045 11/03/19 1100  BP: (!) 134/39 (!) 123/35  (!) 107/35  Pulse: 69 66 67   Resp: (!) 23 (!) 22 (!) 23   Temp:      TempSrc:      SpO2: 91% 95% 96%   Weight:      Height:        Constitutional: NAD, lethargic but arouses to verbal stimuli Eyes: PERRL, lids and conjunctivae normal ENMT: Mucous membranes are moist.  Neck: normal, supple, no masses, no thyromegaly Respiratory: clear to auscultation bilaterally, no wheezing, no crackles. Normal respiratory effort. No accessory muscle use. Cardiovascular: Regular rate and rhythm, no murmurs / rubs / gallops. No extremity edema. 2+ pedal pulses. No carotid bruits. Permacath in the right anterior chest wall Abdomen: no tenderness, no masses palpated. No hepatosplenomegaly. Bowel sounds positive.   Musculoskeletal: no clubbing / cyanosis. No joint deformity upper and lower extremities.  Skin: no rashes, lesions, ulcers.  Neurologic: No gross focal neurologic deficit. Psychiatric: Normal mood and affect.   Labs on Admission: I have personally reviewed following labs and imaging studies  CBC: Recent Labs  Lab 11/03/19 0846  WBC 16.5*  NEUTROABS 14.5*  HGB 8.5*  HCT 28.0*  MCV 91.2  PLT 876   Basic Metabolic Panel: Recent Labs  Lab 11/03/19 0846  NA 132*  K 4.8  CL 97*  CO2 23  GLUCOSE 94  BUN 60*  CREATININE 4.57*  CALCIUM 8.2*   GFR: Estimated Creatinine Clearance: 11.4 mL/min (A) (by C-G formula based on SCr of 4.57 mg/dL (H)). Liver Function Tests: Recent Labs  Lab 11/03/19 0846  AST 30  ALT 11  ALKPHOS 79  BILITOT 0.7  PROT 6.4*  ALBUMIN 2.0*   No results for input(s): LIPASE, AMYLASE in the last 168 hours. No results for input(s): AMMONIA in the last 168 hours. Coagulation Profile: No results for input(s): INR, PROTIME in the last 168 hours. Cardiac Enzymes: No results for input(s): CKTOTAL, CKMB, CKMBINDEX, TROPONINI in the last 168 hours. BNP (last 3 results) No results for input(s): PROBNP in the last 8760 hours. HbA1C: No results for input(s): HGBA1C in the last 72 hours. CBG: No results for input(s): GLUCAP in the last 168 hours. Lipid Profile: No results for input(s): CHOL, HDL, LDLCALC, TRIG, CHOLHDL, LDLDIRECT in the last 72 hours. Thyroid Function Tests: No results for input(s): TSH, T4TOTAL, FREET4, T3FREE, THYROIDAB in the last 72 hours. Anemia Panel: No results for input(s): VITAMINB12, FOLATE, FERRITIN, TIBC, IRON,  RETICCTPCT in the last 72 hours. Urine analysis:    Component Value Date/Time   COLORURINE AMBER (A) 06/15/2019 1800   APPEARANCEUR CLOUDY (A) 06/15/2019 1800   APPEARANCEUR Cloudy 12/14/2013 1727   LABSPEC 1.026 06/15/2019 1800   LABSPEC 1.012 12/14/2013 1727   PHURINE 5.0 06/15/2019 1800   GLUCOSEU 50 (A)  06/15/2019 1800   GLUCOSEU NEGATIVE 07/21/2017 1001   HGBUR NEGATIVE 06/15/2019 1800   BILIRUBINUR NEGATIVE 06/15/2019 1800   BILIRUBINUR Negative 12/14/2013 1727   KETONESUR NEGATIVE 06/15/2019 1800   PROTEINUR >=300 (A) 06/15/2019 1800   UROBILINOGEN 0.2 07/21/2017 1001   NITRITE NEGATIVE 06/15/2019 1800   LEUKOCYTESUR MODERATE (A) 06/15/2019 1800   LEUKOCYTESUR 3+ 12/14/2013 1727    Radiological Exams on Admission: DG Chest Port 1 View  Result Date: 11/03/2019 CLINICAL DATA:  Fever. Altered mental status. EXAM: PORTABLE CHEST 1 VIEW COMPARISON:  08/07/2019 FINDINGS: Double lumen central venous catheter in place with the tips in the right atrium, unchanged. Heart size and vascularity are within normal limits considering the AP portable technique. Lungs are clear. Aortic atherosclerosis. No effusions. No acute bone abnormality. IMPRESSION: No acute disease. Electronically Signed   By: Lorriane Shire M.D.   On: 11/03/2019 09:19    EKG: Independently reviewed.  Sinus rhythm  Assessment/Plan Principal Problem:   Fever in adult Active Problems:   Hypothyroidism   ESRD (end stage renal disease) (HCC)   Acute metabolic encephalopathy   Dementia without behavioral disturbance (HCC)   History of anemia due to chronic kidney disease     Fever in adult Patient resides in a skilled nursing facility and was sent to the emergency room for evaluation of fever. Patient was Covid positive on 08/01/19 and had a repeat test which was also positive in 10/17/19 ( though within 90 days of her initial positive test) Repeat COVID test is negative Fever may be post vaccination related but in a dialysis patient with a PermCath will want to rule out bacteremia, especially since patient has a high white count Will obtain blood cultures from PermCath Place patient on broad-spectrum antibiotics for possible bacteremia until blood culture results become available    End-stage renal disease on  hemodialysis Dialysis days are T/TH/S Will request nephrology consult for renal replacement therapy during this hospitalization   Acute metabolic encephalopathy Most likely related to acute febrile process. She has an underlying history of dementia but is less responsive than her baseline Expect improvement in patient's mental status following resolution of fever   Hypothyroidism Continue Synthroid   Hypertension Continue labetalol    Dementia Hold antidepressants/antipsychotics due to patient's mental status changes   DVT prophylaxis: Heparin Code Status: Full  Family Communication: Plan of care was discussed with patient's son over the phone.  All questions and concerns were addressed Disposition Plan: Back to previous home environment Consults called: Nephrology    Collier Bullock MD [facility]     11/03/2019, 12:18 PM

## 2019-11-03 NOTE — Telephone Encounter (Signed)
Spoke with pt's son and she states the patient has been sent back to the hospital today as she had a fever of 103 this morning. The pt no longer lives at home and her number is no longer in service. Changed patient's home number th her son. Patient now lives at [facility] in [place]. Changed pt's address also.   Pt's son states she is now on dialysis as per her kidney doctor.

## 2019-11-03 NOTE — Progress Notes (Signed)
Hemodialysis patient known at [facility] TTS 9:20am, patient is a resident at [facility] per clinic and never misses dialysis. Per clinic, patient has been COVID positive twice and was just recently discharged from the cohort shift. Please contact me with any dialysis placement concerns.

## 2019-11-03 NOTE — Progress Notes (Signed)
Pharmacy Antibiotic Note  Taylor Tyler is a 79 y.o. female admitted on 11/03/2019. Pharmacy has been consulted for vancomycin and cefepime dosing. Patient with ESRD on HD TTS schedule.  Plan:  Patient has received loading dose of 1750 mg. Will plan to follow with 1000 mg IV TTS with dialysis. Will continue to follow nephrology's plan for HD and adjust dose as necessary. Tentative plan for level prior to 3rd HD session if vancomycin continues.  Cefepime 1 g IV q24h to be given in the evening so that patient receives after HD session on dialysis days.  Height: 5\' 6"  (167.6 cm) Weight: 83.9 kg (185 lb) IBW/kg (Calculated) : 59.3  Temp (24hrs), Avg:100.9 F (38.3 C), Min:100.9 F (38.3 C), Max:100.9 F (38.3 C)  Recent Labs  Lab 11/03/19 0846  WBC 16.5*  CREATININE 4.57*  LATICACIDVEN 1.1    Estimated Creatinine Clearance: 11.4 mL/min (A) (by C-G formula based on SCr of 4.57 mg/dL (H)).    Allergies  Allergen Reactions  . Clams [Shellfish Allergy] Swelling and Other (See Comments)    THROAT SWELLS NECK TURNS RED  . Hydralazine Other (See Comments)    CHEST TIGHTNESS Patient has tolerated multiple doses of hydralazine since allergy was listed   . Norvasc [Amlodipine Besylate] Swelling    Leg edema   . Milk-Related Compounds Diarrhea    Antimicrobials this admission: Vancomycin 4/1 >> Cefepime 4/1 >>  Dose adjustments this admission: NA  Microbiology results: 4/1 BCx: pending   Thank you for allowing pharmacy to be a part of this patient's care.  Tawnya Crook, PharmD 11/03/2019 2:27 PM

## 2019-11-03 NOTE — Progress Notes (Signed)
B/P decreased. Normal saline given.

## 2019-11-03 NOTE — Progress Notes (Signed)
Patient to undergo hemodialysis treatment today.  Orders have been prepared.

## 2019-11-03 NOTE — ED Notes (Signed)
Pt woken easily. Responsive to this RN's voice.

## 2019-11-03 NOTE — Progress Notes (Signed)
Hd tx started 

## 2019-11-03 NOTE — ED Provider Notes (Addendum)
[facility] Emergency Department Provider Note  ____________________________________________   None    (approximate)  I have reviewed the triage vital signs and the nursing notes.  History  Chief Complaint Altered Mental Status    HPI Taylor Tyler is a 79 y.o. female with history of ESRD on dialysis, COVID + on 08/01/19 (repeat tested on 10/17/2019 for pre-procedural testing, which was positive but technically within 90 days of her prior positive), dementia, DM who presents from her living facility for fever and confusion above baseline.  Patient received her second COVID vaccine yesterday.  Today she was noted to be febrile to 102.3 and less interactive than normal.  Normally AO x 2.  Reportedly more lethargic and less responsive than her baseline.  Received Tylenol suppository prior to arrival with improvement in temperature to 100.9 orally.  Dialyzes Tuesday, Thursday, Saturday, has not dialyzed yet today.  Caveat: History limited due to patient's clinical status and history of dementia, primarily provided by EMS and representative from her living facility.   Past Medical Hx Past Medical History:  Diagnosis Date  . Acute blood loss anemia 03/14/2017  . Acute renal failure superimposed on stage 3 chronic kidney disease ([place]) 03/14/2017  . AKI (acute kidney injury) ([place]) 02/28/2017  . Altered mental status 03/29/2017  . Anemia   . Aortic atherosclerosis ([place])   . Arthritis    "joints might ache at times; not that bad" (06/10/2018)  . Bilateral lower extremity edema 03/04/2017  . Bradycardia   . Chronic kidney disease    ?? renal insufficiency,   . CKD (chronic kidney disease) stage 3, GFR 30-59 ml/min 01/15/2017   ?? renal insufficiency, which she thinks is coming from "all these medications"  . COVID-19    10/17/19  . Dementia ([place])   . Diabetes mellitus without complication ([place])    diagnosed 4-5 yrs ago, 06/21/18- "that was years ago"  . Disease of  pancreas   . Diverticulitis    s/p perforation and partial colectomy 01/27/14 with 3 benign lymph nodes   . Diverticulosis 01/15/2017  . DJD (degenerative joint disease)   . DM (diabetes mellitus), type 2 with renal complications ([place]) 5/36/1443  . Elevated ferritin level   . Fatty liver   . Hypertension   . Hypothyroidism    "had radiation" (06/10/2018)  . Kidney disease, chronic, stage V (GFR under 15 ml/min) ([place])    per notes from nursing home-she is on dialysis  . Kidney stone   . Lethargy 02/28/2017  . Obesity, Class III, BMI 40-49.9 (morbid obesity) ([place]) 02/27/2017  . Pleural lipoma   . Postoperative wound infection 04/02/2017  . Spinal stenosis of lumbar region 01/15/2017  . Status post lumbar surgery   . Wound healing, delayed    back    Problem List Patient Active Problem List   Diagnosis Date Noted  . ESRD on dialysis ([place]) 07/22/2019  . Fever   . Pressure injury of skin 06/08/2019  . Acute CHF (congestive heart failure) ([place]) 05/30/2019  . Infection and inflammatory reaction due to internal fixation device of spine, initial encounter ([place]) 06/05/2018  . Neuropathy 01/08/2018  . Fatty liver 01/05/2018  . Chronic pain of both knees 01/05/2018  . Ventral hernia 01/05/2018  . CKD (chronic kidney disease) stage 4, GFR 15-29 ml/min (H[facility]) 07/21/2017  . Vitamin D deficiency 07/21/2017  . Essential hypertension   . Postoperative wound infection 04/02/2017  . Anemia 03/14/2017  . Acute blood loss anemia 03/14/2017  .  Acute renal failure superimposed on stage 3 chronic kidney disease ([place]) 03/14/2017  . Bilateral leg edema 03/04/2017  . Obesity, Class III, BMI 40-49.9 (morbid obesity) ([place]) 02/27/2017  . DM (diabetes mellitus), type 2 with renal complications ([place]) 54/65/0354  . Status post lumbar surgery 02/24/2017  . CKD (chronic kidney disease) stage 3, GFR 30-59 ml/min 01/15/2017  . Hypothyroidism 01/15/2017  . Diverticulosis 01/15/2017  . HLD (hyperlipidemia)  01/15/2017  . Spinal stenosis of lumbar region 01/15/2017  . Disease of pancreas 07/21/2012    Past Surgical Hx Past Surgical History:  Procedure Laterality Date  . ABDOMINAL EXPOSURE N/A 02/24/2017   Procedure: ABDOMINAL EXPOSURE;  Surgeon: Angelia Mould, MD;  Location: [place];  Service: Vascular;  Laterality: N/A;  . ABDOMINAL HYSTERECTOMY  1987   no h/o abnormal paps   . ANTERIOR LAT LUMBAR FUSION N/A 02/24/2017   Procedure: Lumbar three- five Anterior lateral lumbar interbody fusion;  Surgeon: Ditty, Kevan Ny, MD;  Location: [place];  Service: Neurosurgery;  Laterality: N/A;  L3-5 Anterior lateral lumbar interbody fusion with removal of coflex at L3-4, L4-5  . ANTERIOR LUMBAR FUSION N/A 02/24/2017   Procedure: Stage 1: Lumbar five-Sacral one Anterior lumbar interbody fusion;  Surgeon: Ditty, Kevan Ny, MD;  Location: [place];  Service: Neurosurgery;  Laterality: N/A;  Stage 1: L5-S1 Anterior lumbar interbody fusion  . APPENDECTOMY    . APPLICATION OF ROBOTIC ASSISTANCE FOR SPINAL PROCEDURE N/A 02/26/2017   Procedure: APPLICATION OF ROBOTIC ASSISTANCE FOR SPINAL PROCEDURE;  Surgeon: Ditty, Kevan Ny, MD;  Location: [place];  Service: Neurosurgery;  Laterality: N/A;  . APPLICATION OF WOUND VAC N/A 03/30/2017   Procedure: APPLICATION OF WOUND VAC;  Surgeon: Ditty, Kevan Ny, MD;  Location: [place];  Service: Neurosurgery;  Laterality: N/A;  . APPLICATION OF WOUND VAC N/A 06/23/2019   Procedure: APPLICATION OF WOUND VAC;  Surgeon: Consuella Lose, MD;  Location: [place];  Service: Neurosurgery;  Laterality: N/A;  . AV FISTULA PLACEMENT Left 06/08/2019   Procedure: Arteriovenous (Av) Fistula Creation;  Surgeon: Rosetta Posner, MD;  Location: [place];  Service: Vascular;  Laterality: Left;  . BACK SURGERY  2013  . CATARACT EXTRACTION W/ INTRAOCULAR LENS IMPLANT Right   . CHOLECYSTECTOMY OPEN  1982  . COLON SURGERY  01/26/2014   desc.sigmoid colectomy and ventral hernia repair  and splenic flexure mobilization  . COLON SURGERY    . DILATION AND CURETTAGE OF UTERUS    . HERNIA REPAIR    . INSERTION OF DIALYSIS CATHETER Right 06/08/2019   Procedure: INSERTION OF DIALYSIS CATHETER RIGHT INTERNAL JUGULAR;  Surgeon: Rosetta Posner, MD;  Location: [place];  Service: Vascular;  Laterality: Right;  . LUMBAR LAMINECTOMY WITH SPINOUS PROCESS PLATE 2 LEVEL N/A 6/56/8127   Procedure: LUMBAR LAMINECTOMY/DECOMPRESSION MICRODISCECTOMY CoFlex;  Surgeon: Faythe Ghee, MD;  Location: [facility];  Service: Neurosurgery;  Laterality: N/A;  Lumbar three-four,Lumbar Four-Five Laminectomy with Coflex  . LUMBAR WOUND DEBRIDEMENT N/A 03/30/2017   Procedure: Lumbar wound exploration/debridement, placement of wound vac;  Surgeon: Ditty, Kevan Ny, MD;  Location: [place];  Service: Neurosurgery;  Laterality: N/A;  Lumbar wound exploration/debridement, placement of wound vac  . LUMBAR WOUND DEBRIDEMENT N/A 06/06/2018   Procedure: LUMBAR WOUND DEBRIDEMENT/EXPLORATION;  Surgeon: Consuella Lose, MD;  Location: [place];  Service: Neurosurgery;  Laterality: N/A;  . LUMBAR WOUND DEBRIDEMENT N/A 06/22/2018   Procedure: SIMPLE INCISION AND DRAINAGE OF WOUND, APPLICATION OF WOUND VAC;  Surgeon: Consuella Lose, MD;  Location: [place] [facility];  Service: Neurosurgery;  Laterality: N/A;  . LUMBAR WOUND DEBRIDEMENT N/A 06/23/2019   Procedure: LUMBAR WOUND DEBRIDEMENT WITH HARDWARE REMOVAL;  Surgeon: Consuella Lose, MD;  Location: [place];  Service: Neurosurgery;  Laterality: N/A;  . PARTIAL COLECTOMY     01/27/14 diverticulitis and 3 benign lymph nodes [facility] Dr. Pat Patrick   . REDUCTION MAMMAPLASTY Bilateral 1992  . THROMBECTOMY BRACHIAL ARTERY Left 06/08/2019   Procedure: Thrombectomy Brachial Artery;  Surgeon: Rosetta Posner, MD;  Location: [place];  Service: Vascular;  Laterality: Left;  . TUBAL LIGATION    . VENTRAL HERNIA REPAIR  2014    Medications Prior to Admission medications   Medication Sig Start Date End  Date Taking? Authorizing Provider  acetaminophen (TYLENOL) 500 MG tablet Take 500 mg by mouth daily as needed for mild pain.     [provider]  atorvastatin (LIPITOR) 40 MG tablet Take 1 tablet (40 mg total) by mouth daily at 6 PM. 09/03/18   McLean-Scocuzza, Nino Glow, MD  benzonatate (TESSALON PERLES) 100 MG capsule Take 1 capsule (100 mg total) by mouth 3 (three) times daily as needed for cough. 08/07/19 08/06/20  Earleen Newport, MD  citalopram (CELEXA) 10 MG tablet Take 10 mg by mouth daily.    [provider]  divalproex (DEPAKOTE SPRINKLE) 125 MG capsule Take 125 mg by mouth daily.     [provider]  docusate sodium (COLACE) 100 MG capsule Take 100 mg by mouth 2 (two) times daily.    [provider]  donepezil (ARICEPT) 5 MG tablet Take 5 mg by mouth at bedtime.    [provider]  folic acid (FOLVITE) 326 MCG tablet Take 400 mcg by mouth daily.    [provider]  gabapentin (NEURONTIN) 100 MG capsule Take 2 capsules (200 mg total) by mouth at bedtime. 07/04/19   Regalado, Belkys A, MD  labetalol (NORMODYNE) 100 MG tablet Take 1 tablet (100 mg total) by mouth 2 (two) times daily. 07/08/19   Kayleen Memos, DO  Lactobacillus (ACIDOPHILUS PO) Take 175 mg by mouth 2 (two) times daily.    [provider]  lactulose (CHRONULAC) 10 GM/15ML solution Take 30 mLs (20 g total) by mouth daily. 07/05/19   Regalado, Belkys A, MD  levalbuterol (XOPENEX) 1.25 MG/0.5ML nebulizer solution Take 1.25 mg by nebulization every 6 (six) hours as needed for wheezing or shortness of breath. 07/04/19   Regalado, Belkys A, MD  levothyroxine (SYNTHROID, LEVOTHROID) 100 MCG tablet Take 100 mcg by mouth daily before breakfast.    [provider]  meclizine (ANTIVERT) 12.5 MG tablet Take 12.5 mg by mouth daily.    [provider]  memantine (NAMENDA) 5 MG tablet Take 5 mg by mouth 2 (two) times daily.    [provider]  ondansetron  (ZOFRAN-ODT) 4 MG disintegrating tablet Take 4 mg by mouth every 8 (eight) hours as needed for nausea or vomiting.    [provider]  risperiDONE (RISPERDAL) 0.25 MG tablet Take 0.25 mg by mouth 2 (two) times daily.    [provider]  senna-docusate (SENOKOT S) 8.6-50 MG tablet Take 1 tablet by mouth at bedtime as needed.     [provider]  sevelamer carbonate (RENVELA) 800 MG tablet Take 1 tablet (800 mg total) by mouth 3 (three) times daily with meals. 07/04/19   Regalado, Belkys A, MD  traMADol (ULTRAM) 50 MG tablet Take 25 mg by mouth every 6 (six) hours  as needed.     [provider]    Allergies Clams [shellfish allergy], Hydralazine, Norvasc [amlodipine besylate], and Milk-related compounds  Family Hx Family History  Problem Relation Age of Onset  . Diabetes Father   . Heart attack Father   . Hypertension Father   . Heart disease Father   . Kidney disease Father   . Diabetes Mother   . Hypertension Mother   . Heart disease Mother   . Kidney disease Mother   . Lupus Sister   . Heart disease Brother   . Diabetes Brother   . Heart disease Brother   . Diabetes Brother   . Lupus Sister     Social Hx Social History   Tobacco Use  . Smoking status: Never Smoker  . Smokeless tobacco: Never Used  Substance Use Topics  . Alcohol use: Not Currently    Comment: "nothing since age 46" (06/10/2018)  . Drug use: Never     Review of Systems Unable to accurately obtain due to patient's history of dementia and superimposed confusion.   Physical Exam  Vital Signs: ED Triage Vitals  Enc Vitals Group     BP 11/03/19 0850 (!) 121/40     Pulse Rate 11/03/19 0850 69     Resp 11/03/19 0850 18     Temp 11/03/19 0850 (!) 100.9 F (38.3 C)     Temp Source 11/03/19 0850 Oral     SpO2 11/03/19 0850 94 %     Weight 11/03/19 0844 185 lb (83.9 kg)     Height 11/03/19 0844 5\' 6"  (1.676 m)     Head Circumference --      Peak Flow --      Pain  Score --      Pain Loc --      Pain Edu? --      Excl. in [place]? --     Constitutional: Sleeping, awakens to loud voice.  Able to state her full name, follows commands.  Appears fatigued. Head: Normocephalic. Atraumatic. Eyes: Conjunctivae clear. Sclera anicteric.  Nose: No masses or lesions. No congestion or rhinorrhea. Mouth/Throat: Wearing mask.  Neck: No stridor. Trachea midline.  Cardiovascular: Normal rate, regular rhythm.  Dialysis catheter in right upper chest. Respiratory: Normal respiratory effort.  Lungs CTAB. Gastrointestinal: Soft. Non-distended. Non-tender.  Large palpable incisonal hernia, soft and nontender.  No overlying skin changes. Genitourinary: Deferred. Musculoskeletal: No lower extremity edema. No deformities. Neurologic: History of dementia.  Oriented to self.  States her full name, follows commands.  No gross focal or lateralizing neurologic deficits are appreciated.  Skin: Skin is warm to touch. 7-8 cm diameter pressure ulcer/wound to lower back, well appearing w/o drainage, no surrounding erythema or e/o infection.  Psychiatric: Mood and affect are appropriate for situation.  EKG  Personally reviewed and interpreted by myself.   Date: 11/03/2019 Time: 8:51 AM Rate: 69 Rhythm: Sinus Axis: Normal Intervals: Within normal limits No acute ischemic changes No peak T waves No STEMI    Radiology  CXR  IMPRESSION:  No acute disease.     Procedures  Procedure(s) performed (including critical care):  .Critical Care Performed by: Lilia Pro., MD Authorized by: Lilia Pro., MD   Critical care provider statement:    Critical care time (minutes):  35   Critical care was necessary to treat or prevent imminent or life-threatening deterioration of the following conditions:  Sepsis   Critical care was time spent personally by me on the following  activities:  Discussions with consultants, evaluation of patient's response to treatment, examination of  patient, ordering and performing treatments and interventions, ordering and review of laboratory studies, ordering and review of radiographic studies, pulse oximetry, re-evaluation of patient's condition, obtaining history from patient or surrogate and review of old charts     Initial Impression / Assessment and Plan / MDM / ED Course  79 y.o. female who presents to the ED for fever, increased confusion above baseline dementia  Ddx: fever secondary to recent COVID vaccination with resultant fever encephalopathy, infectious encephalopathy such as pneumonia, line infection. She did swab positive for COVID on 3/15 on a pre-procedural screening, however this was within the 90-day window of her original infection on 08/01/19, so cannot confidently state that this is a reinfection.  Will discuss with ID.  On arrival, patient is febrile to 100.9, no hypotension or tachycardia.  However CBC with leukocytosis to 16.5, therefore meeting potential sepsis criteria.  Will initiate broad-spectrum antibiotics.  Lactate reassuringly normal.  Will hold on large-volume fluid resuscitation given her ESRD status, especially as she has yet to dialyze today.  Potassium within normal limits.   Clinical Course as of Nov 02 1053  Thu Nov 03, 2019  1004 Creatinine 4.57, consistent with ESRD history.  Potassium 4.8.  Normal bicarb.  Lactate within normal limits.  Leukocytosis 16.5.  Anemia at baseline.  Will discuss COVID testing with ID given her unique situation as noted above.   [SM]  1036 Discussed case with Dr. Delaine Lame of ID. As it has been >90 days from the original infection, it is reasonable to retest here. Though likely her presentation is related to her 2nd vaccine she received yesterday. If the CT value of the swab is >35 this likely represents "noise" and old infection, rather than new/acute. Her swab done 3/15 was a 6-24 hour swab and therefore does not have CT value associated.   [SM]  1050 Procalcitonin  1.5.   [SM]  1051 Discussed results thus far and plan of care, including admission, with patient's son via phone, who is in agreement. Dr. Holley Raring of nephrology also aware, given her need for HD while hospitalized.   [SM]    Clinical Course User Index [SM] Lilia Pro., MD     _______________________________   As part of my medical decision making I have reviewed available labs, radiology tests, reviewed old records, obtained additional history from family, and discussed with consultants (Dr. Holley Raring, nephrology, Dr. Delaine Lame, ID).   Final Clinical Impression(s) / ED Diagnosis  Final diagnoses:  Fever in adult  Confusion       Note:  This document was prepared using Dragon voice recognition software and may include unintentional dictation errors.     Lilia Pro., MD 11/03/19 1556

## 2019-11-03 NOTE — ED Notes (Signed)
Per 2 A Charge Nurse, this patient will not be able to move until after 1900 due to staffing.

## 2019-11-03 NOTE — ED Triage Notes (Signed)
Pt arrived via EMS from [facility] for report of decreased alertness - Pt is usually A&O x2 - pt had second covid vaccine yesterday and since has not been herself - Temp 102.3 and tylenol supp given - repeat temp here 100.9 orally

## 2019-11-03 NOTE — Progress Notes (Signed)
Decreased O2, placed on 2 liters

## 2019-11-03 NOTE — ED Notes (Signed)
Covid swabs not yet received from lab. Will swab once tubes available.

## 2019-11-03 NOTE — ED Notes (Signed)
D/c serial lactic d/t 1st lactic WNL

## 2019-11-03 NOTE — Telephone Encounter (Signed)
Noted  TMS 

## 2019-11-03 NOTE — Progress Notes (Signed)
HD tx ended 

## 2019-11-03 NOTE — Progress Notes (Signed)
Pre Hd 

## 2019-11-04 ENCOUNTER — Encounter: Payer: Self-pay | Admitting: Internal Medicine

## 2019-11-04 ENCOUNTER — Other Ambulatory Visit: Payer: Self-pay

## 2019-11-04 LAB — BASIC METABOLIC PANEL
Anion gap: 12 (ref 5–15)
BUN: 41 mg/dL — ABNORMAL HIGH (ref 8–23)
CO2: 27 mmol/L (ref 22–32)
Calcium: 8.2 mg/dL — ABNORMAL LOW (ref 8.9–10.3)
Chloride: 98 mmol/L (ref 98–111)
Creatinine, Ser: 3.47 mg/dL — ABNORMAL HIGH (ref 0.44–1.00)
GFR calc Af Amer: 14 mL/min — ABNORMAL LOW (ref >60)
GFR calc non Af Amer: 12 mL/min — ABNORMAL LOW (ref >60)
Glucose, Bld: 73 mg/dL (ref 70–99)
Potassium: 3.8 mmol/L (ref 3.5–5.1)
Sodium: 137 mmol/L (ref 135–145)

## 2019-11-04 LAB — VANCOMYCIN, TROUGH: Vancomycin Tr: 21 ug/mL (ref 15–20)

## 2019-11-04 LAB — CBC
HCT: 29.5 % — ABNORMAL LOW (ref 36.0–46.0)
Hemoglobin: 8.8 g/dL — ABNORMAL LOW (ref 12.0–15.0)
MCH: 27.5 pg (ref 26.0–34.0)
MCHC: 29.8 g/dL — ABNORMAL LOW (ref 30.0–36.0)
MCV: 92.2 fL (ref 80.0–100.0)
Platelets: 156 10*3/uL (ref 150–400)
RBC: 3.2 MIL/uL — ABNORMAL LOW (ref 3.87–5.11)
RDW: 18.1 % — ABNORMAL HIGH (ref 11.5–15.5)
WBC: 11.3 10*3/uL — ABNORMAL HIGH (ref 4.0–10.5)
nRBC: 0 % (ref 0.0–0.2)

## 2019-11-04 LAB — MRSA PCR SCREENING: MRSA by PCR: NEGATIVE

## 2019-11-04 MED ORDER — EPOETIN ALFA 10000 UNIT/ML IJ SOLN: 4000.0000 [IU] | INTRAMUSCULAR | Status: DC

## 2019-11-04 MED ORDER — CHLORHEXIDINE GLUCONATE CLOTH 2 % EX PADS
6.0000 | MEDICATED_PAD | Freq: Every day | CUTANEOUS | Status: DC
  Administered 2019-11-04 – 2019-11-09 (×5): 6 via TOPICAL

## 2019-11-04 NOTE — Progress Notes (Addendum)
Pharmacy Antibiotic Note  Taylor Tyler is a 79 y.o. female admitted on 11/03/2019. Pharmacy has been consulted for vancomycin and cefepime dosing. Patient with ESRD on HD TTS schedule.  Patient went for dialysis yesterday after receiving vancomycin 1750 mg LD but did not receive supplemental vancomycin dose. Random vancomycin level today = 21. No need for supplemental dose.   Plan:  Vancomycin 1000 mg IV TTS with dialysis. Will continue to follow nephrology's plan for HD and adjust dose as necessary. Tentative plan for level prior to 3rd HD session (4/6) if vancomycin continues.  Cefepime 1 g IV q24h to be given in the evening so that patient receives after HD session on dialysis days.  Height: 5\' 6"  (167.6 cm) Weight: 83.9 kg (184 lb 15.5 oz) IBW/kg (Calculated) : 59.3  Temp (24hrs), Avg:99.1 F (37.3 C), Min:97.6 F (36.4 C), Max:100.3 F (37.9 C)  Recent Labs  Lab 11/03/19 0846 11/04/19 0501  WBC 16.5* 11.3*  CREATININE 4.57* 3.47*  LATICACIDVEN 1.1  --     Estimated Creatinine Clearance: 15 mL/min (A) (by C-G formula based on SCr of 3.47 mg/dL (H)).    Allergies  Allergen Reactions  . Clams [Shellfish Allergy] Swelling and Other (See Comments)    THROAT SWELLS NECK TURNS RED  . Hydralazine Other (See Comments)    CHEST TIGHTNESS Patient has tolerated multiple doses of hydralazine since allergy was listed   . Norvasc [Amlodipine Besylate] Swelling    Leg edema   . Milk-Related Compounds Diarrhea    Antimicrobials this admission: Vancomycin 4/1 >> Cefepime 4/1 >>  Dose adjustments this admission: NA  Microbiology results: 4/1 BCx: NG < 24h   Thank you for allowing pharmacy to be a part of this patient's care.  [place] Resident 11/04/2019 1:24 PM

## 2019-11-04 NOTE — Consult Note (Signed)
I have placed a request via Secure Chat to Dr. Billie Ruddy requesting photos of the wound areas of concern to be placed in the EMR.    [place], [place], [place]

## 2019-11-04 NOTE — Consult Note (Signed)
[facility] CONSULT NOTE    Date: 11/04/2019                  Patient Name:  Taylor Tyler  MRN: [id number]  DOB: 26-Dec-1940  Age / Sex: 79 y.o., female         PCP: McLean-Scocuzza, Nino Glow, MD                 Service Requesting Consult:  Hospitalist                 Reason for Consult:  Evaluation management of ESRD            History of Present Illness: Patient is a 79 y.o. female with a PMHx of ESRD on HD TTS [facility] [facility], COVID-19 infection 12/20, dementia, anemia of chronic kidney disease, diabetes mellitus type 2, diverticulosis, hypertension, hypothyroidism, nephrolithiasis, lumbar spinal stenosis, who was admitted to [facility] on 11/03/2019 for evaluation of fever and confusion worse than her normal baseline.  Patient is a poor historian and cannot offer much history in regards to her present illness.  She developed the fever after her second COVID-19 vaccination.  We are asked to see her for evaluation management of ESRD.  Patient recently started dialysis in December 2020.  She underwent dialysis yesterday.  She tolerated the procedure well.  Her son is at the bedside today.   Medications: Outpatient medications: Medications Prior to Admission  Medication Sig Dispense Refill Last Dose  . acetaminophen (TYLENOL) 500 MG tablet Take 500 mg by mouth daily as needed for mild pain.    11/02/2019 at 1700  . atorvastatin (LIPITOR) 40 MG tablet Take 1 tablet (40 mg total) by mouth daily at 6 PM. 90 tablet 3 11/02/2019 at 1723  . benzonatate (TESSALON PERLES) 100 MG capsule Take 1 capsule (100 mg total) by mouth 3 (three) times daily as needed for cough. 30 capsule 0 prn at prn  . citalopram (CELEXA) 10 MG tablet Take 10 mg by mouth daily.   11/02/2019 at 2225  . divalproex (DEPAKOTE SPRINKLE) 125 MG capsule Take 125 mg by mouth 2 (two) times daily.    11/02/2019 at 1700  . docusate sodium (COLACE) 100 MG capsule Take 100 mg by mouth 2 (two) times daily.    11/02/2019 at 1700  . donepezil (ARICEPT) 5 MG tablet Take 5 mg by mouth at bedtime.   11/02/2019 at 2012  . doxycycline (VIBRAMYCIN) 100 MG capsule Take 100 mg by mouth 2 (two) times daily.   11/02/2019 at 1700  . folic acid (FOLVITE) 782 MCG tablet Take 400 mcg by mouth daily.   11/02/2019 at 2225  . gabapentin (NEURONTIN) 100 MG capsule Take 2 capsules (200 mg total) by mouth at bedtime. 30 capsule 0 11/02/2019 at 1902  . labetalol (NORMODYNE) 100 MG tablet Take 1 tablet (100 mg total) by mouth 2 (two) times daily. 60 tablet 0 11/02/2019 at 1723  . Lactobacillus (ACIDOPHILUS PO) Take 175 mg by mouth 2 (two) times daily.   11/02/2019 at 1700  . lactulose, encephalopathy, (GENERLAC) 10 GM/15ML SOLN Take 10 g by mouth daily.   11/02/2019 at 2225  . levothyroxine (SYNTHROID, LEVOTHROID) 100 MCG tablet Take 100 mcg by mouth daily before breakfast.   11/03/2019 at 0504  . LORazepam (ATIVAN PO) Apply 0.5 mg/mL topically 2 (two) times daily as needed. 1 ml to skin   PRN at PRN  . meclizine (ANTIVERT) 12.5 MG tablet Take 12.5 mg  by mouth daily.   11/02/2019 at 2225  . memantine (NAMENDA) 5 MG tablet Take 5 mg by mouth 2 (two) times daily.   11/02/2019 at 1700  . Nutritional Supplements (FEEDING SUPPLEMENT, NEPRO CARB STEADY,) LIQD Take 237 mLs by mouth daily.   11/02/2019 at 1723  . risperiDONE (RISPERDAL) 0.25 MG tablet Take 0.25 mg by mouth 2 (two) times daily.   11/02/2019 at 1700  . sevelamer carbonate (RENVELA) 800 MG tablet Take 1 tablet (800 mg total) by mouth 3 (three) times daily with meals. 90 tablet 0 11/02/2019 at 1902  . traMADol (ULTRAM) 50 MG tablet Take 25 mg by mouth every 6 (six) hours as needed.    Past Week at [phone]  . zinc oxide 20 % ointment Apply 1 application topically in the morning, at noon, and at bedtime.   11/02/2019 at 2343    Current medications: Current Facility-Administered Medications  Medication Dose Route Frequency Provider Last Rate Last Admin  . acetaminophen (TYLENOL) tablet 500  mg  500 mg Oral Daily PRN Agbata, Tochukwu, MD   500 mg at 11/03/19 2156  . acidophilus (RISAQUAD) capsule 1 capsule  1 capsule Oral BID Agbata, Tochukwu, MD   1 capsule at 11/04/19 0904  . atorvastatin (LIPITOR) tablet 40 mg  40 mg Oral q1800 Agbata, Tochukwu, MD      . ceFEPIme (MAXIPIME) 1 g in sodium chloride 0.9 % 100 mL IVPB  1 g Intravenous Q24H Agbata, Tochukwu, MD      . Chlorhexidine Gluconate Cloth 2 % PADS 6 each  6 each Topical Q0600 Enzo Bi, MD   6 each at 11/04/19 [phone]  . divalproex (DEPAKOTE SPRINKLE) capsule 125 mg  125 mg Oral BID Agbata, Tochukwu, MD   125 mg at 11/04/19 0904  . docusate sodium (COLACE) capsule 100 mg  100 mg Oral BID Agbata, Tochukwu, MD   100 mg at 11/04/19 0904  . feeding supplement (NEPRO CARB STEADY) liquid 237 mL  237 mL Oral Q24H Agbata, Tochukwu, MD   237 mL at 11/04/19 1127  . folic acid (FOLVITE) tablet 1 mg  1,000 mcg Oral Daily Agbata, Tochukwu, MD   1 mg at 11/04/19 0905  . heparin injection 5,000 Units  5,000 Units Subcutaneous Q8H Agbata, Tochukwu, MD   5,000 Units at 11/04/19 6761  . labetalol (NORMODYNE) tablet 100 mg  100 mg Oral BID Agbata, Tochukwu, MD   100 mg at 11/03/19 2156  . levothyroxine (SYNTHROID) tablet 100 mcg  100 mcg Oral Q0600 Agbata, Tochukwu, MD   100 mcg at 11/04/19 0636  . liver oil-zinc oxide (DESITIN) 40 % ointment 1 application  1 application Topical TID Agbata, Tochukwu, MD   1 application at 95/09/32 0905  . meclizine (ANTIVERT) tablet 12.5 mg  12.5 mg Oral Daily Agbata, Tochukwu, MD   12.5 mg at 11/04/19 0904  . ondansetron (ZOFRAN) tablet 4 mg  4 mg Oral Q6H PRN Agbata, Tochukwu, MD       Or  . ondansetron (ZOFRAN) injection 4 mg  4 mg Intravenous Q6H PRN Agbata, Tochukwu, MD      . sevelamer carbonate (RENVELA) tablet 800 mg  800 mg Oral TID WC Agbata, Tochukwu, MD   800 mg at 11/04/19 1127  . traMADol (ULTRAM) tablet 25 mg  25 mg Oral Q6H PRN Agbata, Tochukwu, MD      . Derrill Memo ON 11/05/2019] vancomycin (VANCOCIN)  IVPB 1000 mg/200 mL premix  1,000 mg Intravenous Q T,Th,Sa-HD Collier Bullock, MD  Allergies: Allergies  Allergen Reactions  . Clams [Shellfish Allergy] Swelling and Other (See Comments)    THROAT SWELLS NECK TURNS RED  . Hydralazine Other (See Comments)    CHEST TIGHTNESS Patient has tolerated multiple doses of hydralazine since allergy was listed   . Norvasc [Amlodipine Besylate] Swelling    Leg edema   . Milk-Related Compounds Diarrhea      Past Medical History: Past Medical History:  Diagnosis Date  . Acute blood loss anemia 03/14/2017  . Acute renal failure superimposed on stage 3 chronic kidney disease ([place]) 03/14/2017  . AKI (acute kidney injury) ([place]) 02/28/2017  . Altered mental status 03/29/2017  . Anemia   . Aortic atherosclerosis ([place])   . Arthritis    "joints might ache at times; not that bad" (06/10/2018)  . Bilateral lower extremity edema 03/04/2017  . Bradycardia   . Chronic kidney disease    ?? renal insufficiency,   . CKD (chronic kidney disease) stage 3, GFR 30-59 ml/min 01/15/2017   ?? renal insufficiency, which she thinks is coming from "all these medications"  . COVID-19    10/17/19  . Dementia ([place])   . Diabetes mellitus without complication ([place])    diagnosed 4-5 yrs ago, 06/21/18- "that was years ago"  . Disease of pancreas   . Diverticulitis    s/p perforation and partial colectomy 01/27/14 with 3 benign lymph nodes   . Diverticulosis 01/15/2017  . DJD (degenerative joint disease)   . DM (diabetes mellitus), type 2 with renal complications ([place]) 02/04/5008  . Elevated ferritin level   . Fatty liver   . Hypertension   . Hypothyroidism    "had radiation" (06/10/2018)  . Kidney disease, chronic, stage V (GFR under 15 ml/min) ([place])    per notes from nursing home-she is on dialysis  . Kidney stone   . Lethargy 02/28/2017  . Obesity, Class III, BMI 40-49.9 (morbid obesity) ([place]) 02/27/2017  . Pleural lipoma   . Postoperative wound infection  04/02/2017  . Spinal stenosis of lumbar region 01/15/2017  . Status post lumbar surgery   . Wound healing, delayed    back     Past Surgical History: Past Surgical History:  Procedure Laterality Date  . ABDOMINAL EXPOSURE N/A 02/24/2017   Procedure: ABDOMINAL EXPOSURE;  Surgeon: Angelia Mould, MD;  Location: [place];  Service: Vascular;  Laterality: N/A;  . ABDOMINAL HYSTERECTOMY  1987   no h/o abnormal paps   . ANTERIOR LAT LUMBAR FUSION N/A 02/24/2017   Procedure: Lumbar three- five Anterior lateral lumbar interbody fusion;  Surgeon: Ditty, Kevan Ny, MD;  Location: [place];  Service: Neurosurgery;  Laterality: N/A;  L3-5 Anterior lateral lumbar interbody fusion with removal of coflex at L3-4, L4-5  . ANTERIOR LUMBAR FUSION N/A 02/24/2017   Procedure: Stage 1: Lumbar five-Sacral one Anterior lumbar interbody fusion;  Surgeon: Ditty, Kevan Ny, MD;  Location: [place];  Service: Neurosurgery;  Laterality: N/A;  Stage 1: L5-S1 Anterior lumbar interbody fusion  . APPENDECTOMY    . APPLICATION OF ROBOTIC ASSISTANCE FOR SPINAL PROCEDURE N/A 02/26/2017   Procedure: APPLICATION OF ROBOTIC ASSISTANCE FOR SPINAL PROCEDURE;  Surgeon: Ditty, Kevan Ny, MD;  Location: [place];  Service: Neurosurgery;  Laterality: N/A;  . APPLICATION OF WOUND VAC N/A 03/30/2017   Procedure: APPLICATION OF WOUND VAC;  Surgeon: Ditty, Kevan Ny, MD;  Location: [place];  Service: Neurosurgery;  Laterality: N/A;  . APPLICATION OF WOUND VAC N/A 06/23/2019   Procedure: APPLICATION OF WOUND  VAC;  Surgeon: Consuella Lose, MD;  Location: [place];  Service: Neurosurgery;  Laterality: N/A;  . AV FISTULA PLACEMENT Left 06/08/2019   Procedure: Arteriovenous (Av) Fistula Creation;  Surgeon: Rosetta Posner, MD;  Location: [place];  Service: Vascular;  Laterality: Left;  . BACK SURGERY  2013  . CATARACT EXTRACTION W/ INTRAOCULAR LENS IMPLANT Right   . CHOLECYSTECTOMY OPEN  1982  . COLON SURGERY  01/26/2014    desc.sigmoid colectomy and ventral hernia repair and splenic flexure mobilization  . COLON SURGERY    . DILATION AND CURETTAGE OF UTERUS    . HERNIA REPAIR    . INSERTION OF DIALYSIS CATHETER Right 06/08/2019   Procedure: INSERTION OF DIALYSIS CATHETER RIGHT INTERNAL JUGULAR;  Surgeon: Rosetta Posner, MD;  Location: [place];  Service: Vascular;  Laterality: Right;  . LUMBAR LAMINECTOMY WITH SPINOUS PROCESS PLATE 2 LEVEL N/A 0/17/5102   Procedure: LUMBAR LAMINECTOMY/DECOMPRESSION MICRODISCECTOMY CoFlex;  Surgeon: Faythe Ghee, MD;  Location: [facility];  Service: Neurosurgery;  Laterality: N/A;  Lumbar three-four,Lumbar Four-Five Laminectomy with Coflex  . LUMBAR WOUND DEBRIDEMENT N/A 03/30/2017   Procedure: Lumbar wound exploration/debridement, placement of wound vac;  Surgeon: Ditty, Kevan Ny, MD;  Location: [place];  Service: Neurosurgery;  Laterality: N/A;  Lumbar wound exploration/debridement, placement of wound vac  . LUMBAR WOUND DEBRIDEMENT N/A 06/06/2018   Procedure: LUMBAR WOUND DEBRIDEMENT/EXPLORATION;  Surgeon: Consuella Lose, MD;  Location: [place];  Service: Neurosurgery;  Laterality: N/A;  . LUMBAR WOUND DEBRIDEMENT N/A 06/22/2018   Procedure: SIMPLE INCISION AND DRAINAGE OF WOUND, APPLICATION OF WOUND VAC;  Surgeon: Consuella Lose, MD;  Location: [place];  Service: Neurosurgery;  Laterality: N/A;  . LUMBAR WOUND DEBRIDEMENT N/A 06/23/2019   Procedure: LUMBAR WOUND DEBRIDEMENT WITH HARDWARE REMOVAL;  Surgeon: Consuella Lose, MD;  Location: [place];  Service: Neurosurgery;  Laterality: N/A;  . PARTIAL COLECTOMY     01/27/14 diverticulitis and 3 benign lymph nodes [facility] Dr. Pat Patrick   . REDUCTION MAMMAPLASTY Bilateral 1992  . THROMBECTOMY BRACHIAL ARTERY Left 06/08/2019   Procedure: Thrombectomy Brachial Artery;  Surgeon: Rosetta Posner, MD;  Location: [place];  Service: Vascular;  Laterality: Left;  . TUBAL LIGATION    . VENTRAL HERNIA REPAIR  2014     Family History: Family  History  Problem Relation Age of Onset  . Diabetes Father   . Heart attack Father   . Hypertension Father   . Heart disease Father   . Kidney disease Father   . Diabetes Mother   . Hypertension Mother   . Heart disease Mother   . Kidney disease Mother   . Lupus Sister   . Heart disease Brother   . Diabetes Brother   . Heart disease Brother   . Diabetes Brother   . Lupus Sister      Social History: Social History   Socioeconomic History  . Marital status: Widowed    Spouse name: Not on file  . Number of children: Not on file  . Years of education: Not on file  . Highest education level: Not on file  Occupational History  . Occupation: retired Economist work  Tobacco Use  . Smoking status: Never Smoker  . Smokeless tobacco: Never Used  Substance and Sexual Activity  . Alcohol use: Not Currently    Comment: "nothing since age 59" (06/10/2018)  . Drug use: Never  . Sexual activity: Not Currently  Other Topics Concern  . Not on file  Social History  Narrative   Admitted to [facility] 03/03/17   Widowed since 2012    2 sons [name] comes to appts)    Never smoked   Alcohol none   Full Code   12th grade education retired    Occupational hygienist    Social Determinants of Radio broadcast assistant Strain:   . Difficulty of Paying Living Expenses:   Food Insecurity:   . Worried About Charity fundraiser in the Last Year:   . Arboriculturist in the Last Year:   Transportation Needs:   . Film/video editor (Medical):   Marland Kitchen Lack of Transportation (Non-Medical):   Physical Activity:   . Days of Exercise per Week:   . Minutes of Exercise per Session:   Stress:   . Feeling of Stress :   Social Connections:   . Frequency of Communication with Friends and Family:   . Frequency of Social Gatherings with Friends and Family:   . Attends Religious Services:   . Active Member of Clubs or Organizations:   . Attends Archivist Meetings:   Marland Kitchen Marital Status:    Intimate Partner Violence:   . Fear of Current or Ex-Partner:   . Emotionally Abused:   Marland Kitchen Physically Abused:   . Sexually Abused:      Review of Systems: Patient is a poor historian and unable to offer accurate review of systems  Vital Signs: Blood pressure (!) 161/57, pulse (!) 55, temperature 97.7 F (36.5 C), temperature source Oral, resp. rate 18, height 5\' 6"  (1.676 m), weight 83.9 kg, SpO2 100 %.  Weight trends: Filed Weights   11/03/19 0844 11/03/19 1400  Weight: 83.9 kg 83.9 kg    Physical Exam: General: NAD, laying in bed comfortably  Head: Normocephalic, atraumatic.  Eyes: Anicteric, EOMI  Nose: Mucous membranes moist, not inflammed, nonerythematous.  Throat: Oropharynx nonerythematous, no exudate appreciated.   Neck: Supple, trachea midline.  Lungs:  Normal respiratory effort. Clear to auscultation BL without crackles or wheezes.  Heart: S1S2 no rubs  Abdomen:  BS normoactive. Soft, Nondistended, non-tender.  No masses or organomegaly.  Extremities: No pretibial edema.  Neurologic: Awake, alert, confused  Skin: No visible rashes, scars.    Lab results: Basic Metabolic Panel: Recent Labs  Lab 11/03/19 0846 11/03/19 1429 11/04/19 0501  NA 132*  --  137  K 4.8  --  3.8  CL 97*  --  98  CO2 23  --  27  GLUCOSE 94  --  73  BUN 60*  --  41*  CREATININE 4.57*  --  3.47*  CALCIUM 8.2*  --  8.2*  PHOS  --  3.7  --     Liver Function Tests: Recent Labs  Lab 11/03/19 0846  AST 30  ALT 11  ALKPHOS 79  BILITOT 0.7  PROT 6.4*  ALBUMIN 2.0*   No results for input(s): LIPASE, AMYLASE in the last 168 hours. No results for input(s): AMMONIA in the last 168 hours.  CBC: Recent Labs  Lab 11/03/19 0846 11/04/19 0501  WBC 16.5* 11.3*  NEUTROABS 14.5*  --   HGB 8.5* 8.8*  HCT 28.0* 29.5*  MCV 91.2 92.2  PLT 183 156    Cardiac Enzymes: No results for input(s): CKTOTAL, CKMB, CKMBINDEX, TROPONINI in the last 168 hours.  BNP: Invalid input(s):  POCBNP  CBG: No results for input(s): GLUCAP in the last 168 hours.  Microbiology: Results for orders placed or  performed during the hospital encounter of 11/03/19  Blood Culture (routine x 2)     Status: None (Preliminary result)   Collection Time: 11/03/19  8:46 AM   Specimen: BLOOD  Result Value Ref Range Status   Specimen Description BLOOD BLOOD RIGHT FOREARM  Final   Special Requests   Final    BOTTLES DRAWN AEROBIC AND ANAEROBIC Blood Culture adequate volume   Culture   Final    NO GROWTH < 24 HOURS Performed at [facility], [address] [place], [place] [address]    Report Status PENDING  Incomplete  Respiratory Panel by RT PCR (Flu A&B, Covid) - Nasopharyngeal Swab     Status: None   Collection Time: 11/03/19 12:06 PM   Specimen: Nasopharyngeal Swab  Result Value Ref Range Status   SARS Coronavirus 2 by RT PCR NEGATIVE NEGATIVE Final    Comment: (NOTE) SARS-CoV-2 target nucleic acids are NOT DETECTED. The SARS-CoV-2 RNA is generally detectable in upper respiratoy specimens during the acute phase of infection. The lowest concentration of SARS-CoV-2 viral copies this assay can detect is 131 copies/mL. A negative result does not preclude SARS-Cov-2 infection and should not be used as the sole basis for treatment or other patient management decisions. A negative result may occur with  improper specimen collection/handling, submission of specimen other than nasopharyngeal swab, presence of viral mutation(s) within the areas targeted by this assay, and inadequate number of viral copies (<131 copies/mL). A negative result must be combined with clinical observations, patient history, and epidemiological information. The expected result is Negative. Fact Sheet for Patients:  PinkCheek.be Fact Sheet for Healthcare Providers:  GravelBags.it This test is not yet ap proved or cleared by the [place] FDA  and  has been authorized for detection and/or diagnosis of SARS-CoV-2 by FDA under an Emergency Use Authorization (EUA). This EUA will remain  in effect (meaning this test can be used) for the duration of the COVID-19 declaration under Section 564(b)(1) of the Act, 21 U.S.C. section 360bbb-3(b)(1), unless the authorization is terminated or revoked sooner.    Influenza A by PCR NEGATIVE NEGATIVE Final   Influenza B by PCR NEGATIVE NEGATIVE Final    Comment: (NOTE) The Xpert Xpress SARS-CoV-2/FLU/RSV assay is intended as an aid in  the diagnosis of influenza from Nasopharyngeal swab specimens and  should not be used as a sole basis for treatment. Nasal washings and  aspirates are unacceptable for Xpert Xpress SARS-CoV-2/FLU/RSV  testing. Fact Sheet for Patients: PinkCheek.be Fact Sheet for Healthcare Providers: GravelBags.it This test is not yet approved or cleared by the [place] FDA and  has been authorized for detection and/or diagnosis of SARS-CoV-2 by  FDA under an Emergency Use Authorization (EUA). This EUA will remain  in effect (meaning this test can be used) for the duration of the  Covid-19 declaration under Section 564(b)(1) of the Act, 21  U.S.C. section 360bbb-3(b)(1), unless the authorization is  terminated or revoked. Performed at [facility], [address] [place], [place] [address]   Blood Culture (routine x 2)     Status: None (Preliminary result)   Collection Time: 11/03/19  2:28 PM   Specimen: BLOOD  Result Value Ref Range Status   Specimen Description BLOOD A-LINE  Final   Special Requests   Final    BOTTLES DRAWN AEROBIC AND ANAEROBIC Blood Culture adequate volume   Culture   Final    NO GROWTH < 24 HOURS Performed at [facility], [address] [place], [place] [address]    Report Status PENDING  Incomplete    Coagulation Studies: No results for input(s): LABPROT,  INR in the last 72 hours.  Urinalysis: No results for input(s): COLORURINE, LABSPEC, PHURINE, GLUCOSEU, HGBUR, BILIRUBINUR, KETONESUR, PROTEINUR, UROBILINOGEN, NITRITE, LEUKOCYTESUR in the last 72 hours.  Invalid input(s): APPERANCEUR    Imaging: DG Chest Port 1 View  Result Date: 11/03/2019 CLINICAL DATA:  Fever. Altered mental status. EXAM: PORTABLE CHEST 1 VIEW COMPARISON:  08/07/2019 FINDINGS: Double lumen central venous catheter in place with the tips in the right atrium, unchanged. Heart size and vascularity are within normal limits considering the AP portable technique. Lungs are clear. Aortic atherosclerosis. No effusions. No acute bone abnormality. IMPRESSION: No acute disease. Electronically Signed   By: Lorriane Shire M.D.   On: 11/03/2019 09:19      Assessment & Plan: Pt is a 79 y.o. female with a PMHx of ESRD on HD TTS [facility], COVID-19 infection 12/20, dementia, anemia of chronic kidney disease, diabetes mellitus type 2, diverticulosis, hypertension, hypothyroidism, nephrolithiasis, lumbar spinal stenosis, who was admitted to [facility] on 11/03/2019 for evaluation of fever and confusion worse than her normal baseline.  [facility]  1.  ESRD on HD TTS.  Patient did undergo hemodialysis yesterday.  No urgent indication for dialysis today.  We will schedule another dialysis treatment tomorrow for the patient.  2.  Anemia of chronic kidney disease.  Hemoglobin low at 8.8.  Start the patient on Epogen 4000 units IV with dialysis treatments.  3.  Secondary hyperparathyroidism.  Phosphorus currently at target at 3.7. Maintain the patient on Renvela 1 tablet p.o. 3 times daily with meals.  4.  Fever/altered mental status.  Temperature as high as 100.9 over the preceding 24 hours.  Additional work-up as per hospitalist.  Blood cultures thus far negative.

## 2019-11-04 NOTE — Progress Notes (Signed)
Dressing changed to lower back. Cleansed open wound with NS and packed with saline gauze and applied dry gauze.

## 2019-11-04 NOTE — Progress Notes (Signed)
PROGRESS NOTE    Taylor Tyler  GYK:[id number] DOB: July 01, 1941 DOA: 11/03/2019 PCP: McLean-Scocuzza, Nino Glow, MD    Assessment & Plan:   Principal Problem:   Fever in adult Active Problems:   Hypothyroidism   ESRD (end stage renal disease) ([place])   Acute metabolic encephalopathy   Dementia without behavioral disturbance ([place])   History of anemia due to chronic kidney disease    Taylor Tyler is a 79 y.o. AA female with medical history significant for end-stage renal disease on dialysis (T/TH/S), Covid + in December 2020, and on repeat testing done on 10/17/2019 for a procedure which was positive but technically within 90 days of her prior positive test), history of dementia and diabetes mellitus who was sent from SNF to the emergency room for evaluation of fever and confusion above her baseline.  Patient received her second COVID-19 vaccine on 11/02/19 and this morning was noted to be febrile with a temp of 102.20F.  She was said to be less interactive than normal.  At baseline she is normally awake, alert and oriented x 2 but this morning she is lethargic and less responsive.  Her repeat temperature in the emergency room was 100.38F.   Fever and leukocytosis of unclear etiology Patient was Covid positive on 08/01/19 and had a repeat test which was also positive in 10/17/19 (though within 90 days of her initial positive test) Repeat COVID test is negative. Fever may be post vaccination related but in a dialysis patient with a PermCath will want to rule out bacteremia, especially since patient has a high white count, and procal 1.5. --Wounds on the back do not appear infected. PLAN: --f/u blood cultures from PermCath --continue vanc/cefe for now.  End-stage renal disease on hemodialysis T/TH/S --nephrology consult for renal replacement therapy during this hospitalization  Acute metabolic encephalopathy, improved Underlying dementia Most likely related to acute febrile process.   Hypothyroidism Continue Synthroid  Hypertension Continue labetalol  Incisional wound over lumbar spine, POA Pressure ulcer stage 1, POA --None look infected. --Wound care consult and wound care per nursing   DVT prophylaxis: Heparin SQ Code Status: Full code  Family Communication: son updated at bedside today Disposition Plan: SNF in 2-3 days, if cx all neg and fever and mental status resolve.   Subjective and Interval History:  Pt reported not much back pain.  No dyspnea, chest pain, abdominal pain, N/V, dysuria.   Objective: Vitals:   11/03/19 1930 11/03/19 2034 11/04/19 0900 11/04/19 1132  BP: (!) 128/43 (!) 125/32 (!) 171/53 (!) 161/57  Pulse: 65 65  (!) 55  Resp: 17 16 14 18   Temp: 98.7 F (37.1 C) 100.3 F (37.9 C) 97.6 F (36.4 C) 97.7 F (36.5 C)  TempSrc: Oral Oral Oral Oral  SpO2: 100% 96% 100% 100%  Weight:      Height:        Intake/Output Summary (Last 24 hours) at 11/04/2019 1833 Last data filed at 11/04/2019 1330 Gross per 24 hour  Intake 240 ml  Output -  Net 240 ml   Filed Weights   11/03/19 0844 11/03/19 1400  Weight: 83.9 kg 83.9 kg    Examination:   Constitutional: NAD, AAOx3, responding to questions appropriately, son at bedside. HEENT: conjunctivae and lids normal, EOMI CV: RRR no M,R,G. Distal pulses +2.  No cyanosis.   RESP: CTA B/L, normal respiratory effort, on 2L GI: +BS, NTND, incontinent of yellow mushy stool Extremities: No effusions, edema, or tenderness in BLE SKIN: warm,  dry. Neuro: II - XII grossly intact.  Sensation intact Psych: Normal mood and affect.    Incisional wound from prior back surgery, 2 cm narrow tunneling at the distal end of the wound.    Superficial pressure ulcers, no fluctuance underneath sacral wound     Data Reviewed: I have personally reviewed following labs and imaging studies  CBC: Recent Labs  Lab 11/03/19 0846 11/04/19 0501  WBC 16.5* 11.3*  NEUTROABS 14.5*  --   HGB 8.5* 8.8*   HCT 28.0* 29.5*  MCV 91.2 92.2  PLT 183 546   Basic Metabolic Panel: Recent Labs  Lab 11/03/19 0846 11/03/19 1429 11/04/19 0501  NA 132*  --  137  K 4.8  --  3.8  CL 97*  --  98  CO2 23  --  27  GLUCOSE 94  --  73  BUN 60*  --  41*  CREATININE 4.57*  --  3.47*  CALCIUM 8.2*  --  8.2*  PHOS  --  3.7  --    GFR: Estimated Creatinine Clearance: 15 mL/min (A) (by C-G formula based on SCr of 3.47 mg/dL (H)). Liver Function Tests: Recent Labs  Lab 11/03/19 0846  AST 30  ALT 11  ALKPHOS 79  BILITOT 0.7  PROT 6.4*  ALBUMIN 2.0*   No results for input(s): LIPASE, AMYLASE in the last 168 hours. No results for input(s): AMMONIA in the last 168 hours. Coagulation Profile: No results for input(s): INR, PROTIME in the last 168 hours. Cardiac Enzymes: No results for input(s): CKTOTAL, CKMB, CKMBINDEX, TROPONINI in the last 168 hours. BNP (last 3 results) No results for input(s): PROBNP in the last 8760 hours. HbA1C: No results for input(s): HGBA1C in the last 72 hours. CBG: No results for input(s): GLUCAP in the last 168 hours. Lipid Profile: No results for input(s): CHOL, HDL, LDLCALC, TRIG, CHOLHDL, LDLDIRECT in the last 72 hours. Thyroid Function Tests: No results for input(s): TSH, T4TOTAL, FREET4, T3FREE, THYROIDAB in the last 72 hours. Anemia Panel: No results for input(s): VITAMINB12, FOLATE, FERRITIN, TIBC, IRON, RETICCTPCT in the last 72 hours. Sepsis Labs: Recent Labs  Lab 11/03/19 0846  PROCALCITON 1.50  LATICACIDVEN 1.1    Recent Results (from the past 240 hour(s))  Blood Culture (routine x 2)     Status: None (Preliminary result)   Collection Time: 11/03/19  8:46 AM   Specimen: BLOOD  Result Value Ref Range Status   Specimen Description BLOOD BLOOD RIGHT FOREARM  Final   Special Requests   Final    BOTTLES DRAWN AEROBIC AND ANAEROBIC Blood Culture adequate volume   Culture   Final    NO GROWTH < 24 HOURS Performed at [facility], [address], [place], [place] [address]    Report Status PENDING  Incomplete  Respiratory Panel by RT PCR (Flu A&B, Covid) - Nasopharyngeal Swab     Status: None   Collection Time: 11/03/19 12:06 PM   Specimen: Nasopharyngeal Swab  Result Value Ref Range Status   SARS Coronavirus 2 by RT PCR NEGATIVE NEGATIVE Final    Comment: (NOTE) SARS-CoV-2 target nucleic acids are NOT DETECTED. The SARS-CoV-2 RNA is generally detectable in upper respiratoy specimens during the acute phase of infection. The lowest concentration of SARS-CoV-2 viral copies this assay can detect is 131 copies/mL. A negative result does not preclude SARS-Cov-2 infection and should not be used as the sole basis for treatment or other patient management decisions. A negative result may occur  with  improper specimen collection/handling, submission of specimen other than nasopharyngeal swab, presence of viral mutation(s) within the areas targeted by this assay, and inadequate number of viral copies (<131 copies/mL). A negative result must be combined with clinical observations, patient history, and epidemiological information. The expected result is Negative. Fact Sheet for Patients:  PinkCheek.be Fact Sheet for Healthcare Providers:  GravelBags.it This test is not yet ap proved or cleared by the [place] FDA and  has been authorized for detection and/or diagnosis of SARS-CoV-2 by FDA under an Emergency Use Authorization (EUA). This EUA will remain  in effect (meaning this test can be used) for the duration of the COVID-19 declaration under Section 564(b)(1) of the Act, 21 U.S.C. section 360bbb-3(b)(1), unless the authorization is terminated or revoked sooner.    Influenza A by PCR NEGATIVE NEGATIVE Final   Influenza B by PCR NEGATIVE NEGATIVE Final    Comment: (NOTE) The Xpert Xpress SARS-CoV-2/FLU/RSV assay is intended as an aid in  the diagnosis of  influenza from Nasopharyngeal swab specimens and  should not be used as a sole basis for treatment. Nasal washings and  aspirates are unacceptable for Xpert Xpress SARS-CoV-2/FLU/RSV  testing. Fact Sheet for Patients: PinkCheek.be Fact Sheet for Healthcare Providers: GravelBags.it This test is not yet approved or cleared by the [place] FDA and  has been authorized for detection and/or diagnosis of SARS-CoV-2 by  FDA under an Emergency Use Authorization (EUA). This EUA will remain  in effect (meaning this test can be used) for the duration of the  Covid-19 declaration under Section 564(b)(1) of the Act, 21  U.S.C. section 360bbb-3(b)(1), unless the authorization is  terminated or revoked. Performed at [facility], [address] [place], [place] [address]   Blood Culture (routine x 2)     Status: None (Preliminary result)   Collection Time: 11/03/19  2:28 PM   Specimen: BLOOD  Result Value Ref Range Status   Specimen Description BLOOD A-LINE  Final   Special Requests   Final    BOTTLES DRAWN AEROBIC AND ANAEROBIC Blood Culture adequate volume   Culture   Final    NO GROWTH < 24 HOURS Performed at [facility], [address] [place], [place] [address]    Report Status PENDING  Incomplete      Radiology Studies: DG Chest Port 1 View  Result Date: 11/03/2019 CLINICAL DATA:  Fever. Altered mental status. EXAM: PORTABLE CHEST 1 VIEW COMPARISON:  08/07/2019 FINDINGS: Double lumen central venous catheter in place with the tips in the right atrium, unchanged. Heart size and vascularity are within normal limits considering the AP portable technique. Lungs are clear. Aortic atherosclerosis. No effusions. No acute bone abnormality. IMPRESSION: No acute disease. Electronically Signed   By: Lorriane Shire M.D.   On: 11/03/2019 09:19     Scheduled Meds: . acidophilus  1 capsule Oral BID  . atorvastatin   40 mg Oral q1800  . Chlorhexidine Gluconate Cloth  6 each Topical Q0600  . divalproex  125 mg Oral BID  . docusate sodium  100 mg Oral BID  . [START ON 11/05/2019] epoetin (EPOGEN/PROCRIT) injection  4,000 Units Intravenous Q T,Th,Sa-HD  . feeding supplement (NEPRO CARB STEADY)  237 mL Oral Q24H  . folic acid  9,326 mcg Oral Daily  . heparin  5,000 Units Subcutaneous Q8H  . labetalol  100 mg Oral BID  . levothyroxine  100 mcg Oral Q0600  . liver oil-zinc oxide  1 application Topical  TID  . meclizine  12.5 mg Oral Daily  . sevelamer carbonate  800 mg Oral TID WC   Continuous Infusions: . ceFEPime (MAXIPIME) IV    . [START ON 11/05/2019] vancomycin       LOS: 1 day     Enzo Bi, MD [facility] If 7PM-7AM, please contact night-coverage 11/04/2019, 6:33 PM

## 2019-11-04 NOTE — Consult Note (Signed)
WOC Nurse Consult Note: Reason for Consult: lumbar wound Chronic non healing  Wound type: surgical   initial lumbar surgery 03/01/13, multiple explorations and debridements since. Last surgical debridement 06/23/19 Pressure Injury POA: NA Measurement: see nursing flowsheet; requested today Wound bed: 1. Proximal 100% clean, pink, non granular  2. Distal 100% clean,pink, non granular Drainage (amount, consistency, odor) unable to assess, dressings removed; nursing FS indicates none Periwound:intact; some evidence of re-epithelialization at distal site  Dressing procedure/placement/frequency: Silver hydrofiber for recalcitrant non healing wounds for potential bioburdan/biofilm. Dry dressing topper. Change every other day.   Follow up with wound care MD or center of choice.   Discussed POC with patient and bedside nurse.  Re consult if needed, will not follow at this time. Thanks  Seraj Dunnam [employer], RN,CWOCN, CNS, Garron Eline [phone])

## 2019-11-05 LAB — BASIC METABOLIC PANEL
Anion gap: 10 (ref 5–15)
BUN: 57 mg/dL — ABNORMAL HIGH (ref 8–23)
CO2: 27 mmol/L (ref 22–32)
Calcium: 8.1 mg/dL — ABNORMAL LOW (ref 8.9–10.3)
Chloride: 97 mmol/L — ABNORMAL LOW (ref 98–111)
Creatinine, Ser: 4.28 mg/dL — ABNORMAL HIGH (ref 0.44–1.00)
GFR calc Af Amer: 11 mL/min — ABNORMAL LOW (ref >60)
GFR calc non Af Amer: 9 mL/min — ABNORMAL LOW (ref >60)
Glucose, Bld: 76 mg/dL (ref 70–99)
Potassium: 4.3 mmol/L (ref 3.5–5.1)
Sodium: 134 mmol/L — ABNORMAL LOW (ref 135–145)

## 2019-11-05 LAB — CBC
HCT: 26.6 % — ABNORMAL LOW (ref 36.0–46.0)
Hemoglobin: 8.2 g/dL — ABNORMAL LOW (ref 12.0–15.0)
MCH: 27.8 pg (ref 26.0–34.0)
MCHC: 30.8 g/dL (ref 30.0–36.0)
MCV: 90.2 fL (ref 80.0–100.0)
Platelets: 167 10*3/uL (ref 150–400)
RBC: 2.95 MIL/uL — ABNORMAL LOW (ref 3.87–5.11)
RDW: 18 % — ABNORMAL HIGH (ref 11.5–15.5)
WBC: 16.1 10*3/uL — ABNORMAL HIGH (ref 4.0–10.5)
nRBC: 0 % (ref 0.0–0.2)

## 2019-11-05 LAB — MAGNESIUM: Magnesium: 1.6 mg/dL — ABNORMAL LOW (ref 1.7–2.4)

## 2019-11-05 LAB — PHOSPHORUS: Phosphorus: 5.8 mg/dL — ABNORMAL HIGH (ref 2.5–4.6)

## 2019-11-05 MED ORDER — EPOETIN ALFA 4000 UNIT/ML IJ SOLN
4000.0000 [IU] | INTRAMUSCULAR | Status: DC
  Administered 2019-11-05: 4000 [IU] via SUBCUTANEOUS
  Filled 2019-11-05 (×2): qty 1

## 2019-11-05 NOTE — Progress Notes (Signed)
Pt dressing was changed. Will continue to monitor.

## 2019-11-05 NOTE — Progress Notes (Signed)
PROGRESS NOTE    Taylor Tyler  RCV:[id number] DOB: 11-01-1940 DOA: 11/03/2019 PCP: McLean-Scocuzza, Nino Glow, MD    Assessment & Plan:   Principal Problem:   Fever in adult Active Problems:   Hypothyroidism   ESRD (end stage renal disease) ([place])   Acute metabolic encephalopathy   Dementia without behavioral disturbance ([place])   History of anemia due to chronic kidney disease    Taylor Tyler is a 79 y.o. AA female with medical history significant for end-stage renal disease on dialysis (T/TH/S), Covid + in December 2020, and on repeat testing done on 10/17/2019 for a procedure which was positive but technically within 90 days of her prior positive test), history of dementia and diabetes mellitus who was sent from SNF to the emergency room for evaluation of fever and confusion above her baseline.  Patient received her second COVID-19 vaccine on 11/02/19 and this morning was noted to be febrile with a temp of 102.38F.  She was said to be less interactive than normal.  At baseline she is normally awake, alert and oriented x 2 but this morning she is lethargic and less responsive.  Her repeat temperature in the emergency room was 100.43F.   Fever and leukocytosis of unclear etiology Patient was Covid positive on 08/01/19 and had a repeat test which was also positive in 10/17/19 (though within 90 days of her initial positive test) Repeat COVID test is negative. Fever may be post vaccination related but in a dialysis patient with a PermCath will want to rule out bacteremia, especially since patient has a high white count, and procal 1.5. --Wounds on the back do not appear infected. PLAN: --f/u blood cultures from PermCath --d/c vanc/cefe and monitor for fever  End-stage renal disease on hemodialysis T/TH/S  Hyperphos --nephrology consult for renal replacement therapy during this hospitalization --continue home sevelamer  Acute metabolic encephalopathy, improved Underlying dementia Most  likely related to acute febrile process.  Hypothyroidism Continue Synthroid  Hypertension --BP fluctuating during dialysis --Continue labetalol with hold parameters  Incisional wound over lumbar spine, POA Pressure ulcer stage 1, POA --None look infected. --Wound care consult and wound care per nursing   DVT prophylaxis: Heparin SQ Code Status: Full code  Family Communication: son updated at bedside today Disposition Plan: Back to SNF in 2 days, if cx all neg and fever and mental status resolve.   Subjective and Interval History:  Pt reported feeling kind of cold while getting dialysis.  Ate ok.  No fever, dyspnea, chest pain, abdominal pain, N/V/D.   Objective: Vitals:   11/05/19 1115 11/05/19 1130 11/05/19 1145 11/05/19 1155  BP: (!) 152/58 (!) 141/54 (!) 104/50 (!) 106/47  Pulse: (!) 59 (!) 57 62 (!) 58  Resp: 15 16 14 12   Temp:      TempSrc:      SpO2:      Weight:      Height:        Intake/Output Summary (Last 24 hours) at 11/05/2019 1233 Last data filed at 11/05/2019 1155 Gross per 24 hour  Intake 480 ml  Output 1500 ml  Net -1020 ml   Filed Weights   11/03/19 0844 11/03/19 1400 11/05/19 0518  Weight: 83.9 kg 83.9 kg 77 kg    Examination:   Constitutional: NAD, AAOx3, responding to questions appropriately HEENT: conjunctivae and lids normal, EOMI CV: RRR no M,R,G. Distal pulses +2.  No cyanosis.   RESP: CTA B/L, normal respiratory effort, on 2L GI: +BS, NTND, incontinent  of yellow mushy stool Extremities: No effusions, edema, or tenderness in BLE SKIN: warm, dry. Neuro: II - XII grossly intact.  Sensation intact  Photos taken on 11/04/19 Incisional wound from prior back surgery, 2 cm narrow tunneling at the distal end of the wound.    Superficial pressure ulcers, no fluctuance underneath sacral wound     Data Reviewed: I have personally reviewed following labs and imaging studies  CBC: Recent Labs  Lab 11/03/19 0846 11/04/19 0501  11/05/19 0447  WBC 16.5* 11.3* 16.1*  NEUTROABS 14.5*  --   --   HGB 8.5* 8.8* 8.2*  HCT 28.0* 29.5* 26.6*  MCV 91.2 92.2 90.2  PLT 183 156 622   Basic Metabolic Panel: Recent Labs  Lab 11/03/19 0846 11/03/19 1429 11/04/19 0501 11/05/19 0447  NA 132*  --  137 134*  K 4.8  --  3.8 4.3  CL 97*  --  98 97*  CO2 23  --  27 27  GLUCOSE 94  --  73 76  BUN 60*  --  41* 57*  CREATININE 4.57*  --  3.47* 4.28*  CALCIUM 8.2*  --  8.2* 8.1*  MG  --   --   --  1.6*  PHOS  --  3.7  --  5.8*   GFR: Estimated Creatinine Clearance: 11.7 mL/min (A) (by C-G formula based on SCr of 4.28 mg/dL (H)). Liver Function Tests: Recent Labs  Lab 11/03/19 0846  AST 30  ALT 11  ALKPHOS 79  BILITOT 0.7  PROT 6.4*  ALBUMIN 2.0*   No results for input(s): LIPASE, AMYLASE in the last 168 hours. No results for input(s): AMMONIA in the last 168 hours. Coagulation Profile: No results for input(s): INR, PROTIME in the last 168 hours. Cardiac Enzymes: No results for input(s): CKTOTAL, CKMB, CKMBINDEX, TROPONINI in the last 168 hours. BNP (last 3 results) No results for input(s): PROBNP in the last 8760 hours. HbA1C: No results for input(s): HGBA1C in the last 72 hours. CBG: No results for input(s): GLUCAP in the last 168 hours. Lipid Profile: No results for input(s): CHOL, HDL, LDLCALC, TRIG, CHOLHDL, LDLDIRECT in the last 72 hours. Thyroid Function Tests: No results for input(s): TSH, T4TOTAL, FREET4, T3FREE, THYROIDAB in the last 72 hours. Anemia Panel: No results for input(s): VITAMINB12, FOLATE, FERRITIN, TIBC, IRON, RETICCTPCT in the last 72 hours. Sepsis Labs: Recent Labs  Lab 11/03/19 0846  PROCALCITON 1.50  LATICACIDVEN 1.1    Recent Results (from the past 240 hour(s))  Blood Culture (routine x 2)     Status: None (Preliminary result)   Collection Time: 11/03/19  8:46 AM   Specimen: BLOOD  Result Value Ref Range Status   Specimen Description BLOOD BLOOD RIGHT FOREARM  Final    Special Requests   Final    BOTTLES DRAWN AEROBIC AND ANAEROBIC Blood Culture adequate volume   Culture   Final    NO GROWTH 2 DAYS Performed at [facility], [address] [place], [place] [address]    Report Status PENDING  Incomplete  Respiratory Panel by RT PCR (Flu A&B, Covid) - Nasopharyngeal Swab     Status: None   Collection Time: 11/03/19 12:06 PM   Specimen: Nasopharyngeal Swab  Result Value Ref Range Status   SARS Coronavirus 2 by RT PCR NEGATIVE NEGATIVE Final    Comment: (NOTE) SARS-CoV-2 target nucleic acids are NOT DETECTED. The SARS-CoV-2 RNA is generally detectable in upper respiratoy specimens during the acute phase of infection.  The lowest concentration of SARS-CoV-2 viral copies this assay can detect is 131 copies/mL. A negative result does not preclude SARS-Cov-2 infection and should not be used as the sole basis for treatment or other patient management decisions. A negative result may occur with  improper specimen collection/handling, submission of specimen other than nasopharyngeal swab, presence of viral mutation(s) within the areas targeted by this assay, and inadequate number of viral copies (<131 copies/mL). A negative result must be combined with clinical observations, patient history, and epidemiological information. The expected result is Negative. Fact Sheet for Patients:  PinkCheek.be Fact Sheet for Healthcare Providers:  GravelBags.it This test is not yet ap proved or cleared by the [place] FDA and  has been authorized for detection and/or diagnosis of SARS-CoV-2 by FDA under an Emergency Use Authorization (EUA). This EUA will remain  in effect (meaning this test can be used) for the duration of the COVID-19 declaration under Section 564(b)(1) of the Act, 21 U.S.C. section 360bbb-3(b)(1), unless the authorization is terminated or revoked sooner.    Influenza A by PCR  NEGATIVE NEGATIVE Final   Influenza B by PCR NEGATIVE NEGATIVE Final    Comment: (NOTE) The Xpert Xpress SARS-CoV-2/FLU/RSV assay is intended as an aid in  the diagnosis of influenza from Nasopharyngeal swab specimens and  should not be used as a sole basis for treatment. Nasal washings and  aspirates are unacceptable for Xpert Xpress SARS-CoV-2/FLU/RSV  testing. Fact Sheet for Patients: PinkCheek.be Fact Sheet for Healthcare Providers: GravelBags.it This test is not yet approved or cleared by the [place] FDA and  has been authorized for detection and/or diagnosis of SARS-CoV-2 by  FDA under an Emergency Use Authorization (EUA). This EUA will remain  in effect (meaning this test can be used) for the duration of the  Covid-19 declaration under Section 564(b)(1) of the Act, 21  U.S.C. section 360bbb-3(b)(1), unless the authorization is  terminated or revoked. Performed at [facility], [address] [place], [place] [address]   Blood Culture (routine x 2)     Status: None (Preliminary result)   Collection Time: 11/03/19  2:28 PM   Specimen: BLOOD  Result Value Ref Range Status   Specimen Description BLOOD A-LINE  Final   Special Requests   Final    BOTTLES DRAWN AEROBIC AND ANAEROBIC Blood Culture adequate volume   Culture   Final    NO GROWTH 2 DAYS Performed at [facility], [address] [place], [place] [address]    Report Status PENDING  Incomplete  MRSA PCR Screening     Status: Abnormal   Collection Time: 11/04/19  6:37 PM   Specimen: Nasal Mucosa; Nasopharyngeal  Result Value Ref Range Status   MRSA by PCR (A) NEGATIVE Final    INVALID, UNABLE TO DETERMINE THE PRESENCE OF TARGET DUE TO SPECIMEN INTEGRITY. RECOLLECTION REQUESTED.    Comment: C/ MARICAR KIMREY ON 11/04/19 AT [address] [facility] Performed at [facility], [address] [place], [place] [address]   MRSA PCR Screening      Status: None   Collection Time: 11/04/19 10:33 PM  Result Value Ref Range Status   MRSA by PCR NEGATIVE NEGATIVE Final    Comment:        The GeneXpert MRSA Assay (FDA approved for NASAL specimens only), is one component of a comprehensive MRSA colonization surveillance program. It is not intended to diagnose MRSA infection nor to guide or monitor treatment for MRSA infections. Performed at [facility], [address] [place], [place] [address]       Radiology Studies: No results found.   Scheduled Meds: . acidophilus  1 capsule Oral BID  . atorvastatin  40 mg Oral q1800  . Chlorhexidine Gluconate Cloth  6 each Topical Q0600  . divalproex  125 mg Oral BID  . docusate sodium  100 mg Oral BID  . epoetin (EPOGEN/PROCRIT) injection  4,000 Units Subcutaneous Q T,Th,Sa-HD  . feeding supplement (NEPRO CARB STEADY)  237 mL Oral Q24H  . folic acid  7,703 mcg Oral Daily  . heparin  5,000 Units Subcutaneous Q8H  . labetalol  100 mg Oral BID  . levothyroxine  100 mcg Oral Q0600  . liver oil-zinc oxide  1 application Topical TID  . meclizine  12.5 mg Oral Daily  . sevelamer carbonate  800 mg Oral TID WC   Continuous Infusions:    LOS: 2 days     Enzo Bi, MD [facility] If 7PM-7AM, please contact night-coverage 11/05/2019, 12:33 PM

## 2019-11-05 NOTE — Progress Notes (Signed)
[facility]  ROUNDING NOTE   Subjective:   Seen and examined on hemodialysis treatment. Tolerating treatment well.  Afebrile last 24 hours.     HEMODIALYSIS FLOWSHEET:  Blood Flow Rate (mL/min): 400 mL/min Arterial Pressure (mmHg): -180 mmHg Venous Pressure (mmHg): 140 mmHg Transmembrane Pressure (mmHg): 60 mmHg Ultrafiltration Rate (mL/min): 650 mL/min Dialysate Flow Rate (mL/min): 800 ml/min Conductivity: Machine : 13.9 Conductivity: Machine : 13.9 Dialysis Fluid Bolus: Normal Saline Bolus Amount (mL): 250 mL    Objective:  Vital signs in last 24 hours:  Temp:  [97.7 F (36.5 C)-98.6 F (37 C)] 98.5 F (36.9 C) (04/03 0825) Pulse Rate:  [55-65] 57 (04/03 0900) Resp:  [15-18] 16 (04/03 0900) BP: (140-161)/(48-67) 142/67 (04/03 0900) SpO2:  [99 %-100 %] 99 % (04/03 0518) Weight:  [77 kg] 77 kg (04/03 0518)  Weight change: -6.94 kg Filed Weights   11/03/19 0844 11/03/19 1400 11/05/19 0518  Weight: 83.9 kg 83.9 kg 77 kg    Intake/Output: I/O last 3 completed shifts: In: 480 [P.O.:480] Out: 0    Intake/Output this shift:  No intake/output data recorded.  Physical Exam: General: NAD, laying in bed  Head: Normocephalic, atraumatic. Moist oral mucosal membranes  Eyes: Anicteric, PERRL  Neck: Supple, trachea midline  Lungs:  Clear to auscultation  Heart: Regular rate and rhythm  Abdomen:  Soft, nontender,   Extremities: no peripheral edema.  Neurologic: Alert to self and place only.   Skin: +decubitus ulcers  Access: RIJ permcath    Basic Metabolic Panel: Recent Labs  Lab 11/03/19 0846 11/03/19 1429 11/04/19 0501 11/05/19 0447  NA 132*  --  137 134*  K 4.8  --  3.8 4.3  CL 97*  --  98 97*  CO2 23  --  27 27  GLUCOSE 94  --  73 76  BUN 60*  --  41* 57*  CREATININE 4.57*  --  3.47* 4.28*  CALCIUM 8.2*  --  8.2* 8.1*  MG  --   --   --  1.6*  PHOS  --  3.7  --  5.8*    Liver Function Tests: Recent Labs  Lab 11/03/19 0846  AST 30   ALT 11  ALKPHOS 79  BILITOT 0.7  PROT 6.4*  ALBUMIN 2.0*   No results for input(s): LIPASE, AMYLASE in the last 168 hours. No results for input(s): AMMONIA in the last 168 hours.  CBC: Recent Labs  Lab 11/03/19 0846 11/04/19 0501 11/05/19 0447  WBC 16.5* 11.3* 16.1*  NEUTROABS 14.5*  --   --   HGB 8.5* 8.8* 8.2*  HCT 28.0* 29.5* 26.6*  MCV 91.2 92.2 90.2  PLT 183 156 167    Cardiac Enzymes: No results for input(s): CKTOTAL, CKMB, CKMBINDEX, TROPONINI in the last 168 hours.  BNP: Invalid input(s): POCBNP  CBG: No results for input(s): GLUCAP in the last 168 hours.  Microbiology: Results for orders placed or performed during the hospital encounter of 11/03/19  Blood Culture (routine x 2)     Status: None (Preliminary result)   Collection Time: 11/03/19  8:46 AM   Specimen: BLOOD  Result Value Ref Range Status   Specimen Description BLOOD BLOOD RIGHT FOREARM  Final   Special Requests   Final    BOTTLES DRAWN AEROBIC AND ANAEROBIC Blood Culture adequate volume   Culture   Final    NO GROWTH 2 DAYS Performed at [facility], [address], [place], [place] [address]    Report  Status PENDING  Incomplete  Respiratory Panel by RT PCR (Flu A&B, Covid) - Nasopharyngeal Swab     Status: None   Collection Time: 11/03/19 12:06 PM   Specimen: Nasopharyngeal Swab  Result Value Ref Range Status   SARS Coronavirus 2 by RT PCR NEGATIVE NEGATIVE Final    Comment: (NOTE) SARS-CoV-2 target nucleic acids are NOT DETECTED. The SARS-CoV-2 RNA is generally detectable in upper respiratoy specimens during the acute phase of infection. The lowest concentration of SARS-CoV-2 viral copies this assay can detect is 131 copies/mL. A negative result does not preclude SARS-Cov-2 infection and should not be used as the sole basis for treatment or other patient management decisions. A negative result may occur with  improper specimen collection/handling, submission of specimen  other than nasopharyngeal swab, presence of viral mutation(s) within the areas targeted by this assay, and inadequate number of viral copies (<131 copies/mL). A negative result must be combined with clinical observations, patient history, and epidemiological information. The expected result is Negative. Fact Sheet for Patients:  PinkCheek.be Fact Sheet for Healthcare Providers:  GravelBags.it This test is not yet ap proved or cleared by the [place] FDA and  has been authorized for detection and/or diagnosis of SARS-CoV-2 by FDA under an Emergency Use Authorization (EUA). This EUA will remain  in effect (meaning this test can be used) for the duration of the COVID-19 declaration under Section 564(b)(1) of the Act, 21 U.S.C. section 360bbb-3(b)(1), unless the authorization is terminated or revoked sooner.    Influenza A by PCR NEGATIVE NEGATIVE Final   Influenza B by PCR NEGATIVE NEGATIVE Final    Comment: (NOTE) The Xpert Xpress SARS-CoV-2/FLU/RSV assay is intended as an aid in  the diagnosis of influenza from Nasopharyngeal swab specimens and  should not be used as a sole basis for treatment. Nasal washings and  aspirates are unacceptable for Xpert Xpress SARS-CoV-2/FLU/RSV  testing. Fact Sheet for Patients: PinkCheek.be Fact Sheet for Healthcare Providers: GravelBags.it This test is not yet approved or cleared by the [place] FDA and  has been authorized for detection and/or diagnosis of SARS-CoV-2 by  FDA under an Emergency Use Authorization (EUA). This EUA will remain  in effect (meaning this test can be used) for the duration of the  Covid-19 declaration under Section 564(b)(1) of the Act, 21  U.S.C. section 360bbb-3(b)(1), unless the authorization is  terminated or revoked. Performed at [facility], [address] [place], [place]  [address]   Blood Culture (routine x 2)     Status: None (Preliminary result)   Collection Time: 11/03/19  2:28 PM   Specimen: BLOOD  Result Value Ref Range Status   Specimen Description BLOOD A-LINE  Final   Special Requests   Final    BOTTLES DRAWN AEROBIC AND ANAEROBIC Blood Culture adequate volume   Culture   Final    NO GROWTH 2 DAYS Performed at [facility], [address] [place], [place] [address]    Report Status PENDING  Incomplete  MRSA PCR Screening     Status: Abnormal   Collection Time: 11/04/19  6:37 PM   Specimen: Nasal Mucosa; Nasopharyngeal  Result Value Ref Range Status   MRSA by PCR (A) NEGATIVE Final    INVALID, UNABLE TO DETERMINE THE PRESENCE OF TARGET DUE TO SPECIMEN INTEGRITY. RECOLLECTION REQUESTED.    Comment: C/ MARICAR KIMREY ON 11/04/19 AT 2132 [facility] Performed at [facility], [address], [place], [place] [address]   MRSA PCR Screening  Status: None   Collection Time: 11/04/19 10:33 PM  Result Value Ref Range Status   MRSA by PCR NEGATIVE NEGATIVE Final    Comment:        The GeneXpert MRSA Assay (FDA approved for NASAL specimens only), is one component of a comprehensive MRSA colonization surveillance program. It is not intended to diagnose MRSA infection nor to guide or monitor treatment for MRSA infections. Performed at [facility], [address] [place], [place] [address]     Coagulation Studies: No results for input(s): LABPROT, INR in the last 72 hours.  Urinalysis: No results for input(s): COLORURINE, LABSPEC, PHURINE, GLUCOSEU, HGBUR, BILIRUBINUR, KETONESUR, PROTEINUR, UROBILINOGEN, NITRITE, LEUKOCYTESUR in the last 72 hours.  Invalid input(s): APPERANCEUR    Imaging: No results found.   Medications:    . acidophilus  1 capsule Oral BID  . atorvastatin  40 mg Oral q1800  . Chlorhexidine Gluconate Cloth  6 each Topical Q0600  . divalproex  125 mg Oral BID  . docusate sodium  100 mg Oral  BID  . epoetin (EPOGEN/PROCRIT) injection  4,000 Units Intravenous Q T,Th,Sa-HD  . feeding supplement (NEPRO CARB STEADY)  237 mL Oral Q24H  . folic acid  5,956 mcg Oral Daily  . heparin  5,000 Units Subcutaneous Q8H  . labetalol  100 mg Oral BID  . levothyroxine  100 mcg Oral Q0600  . liver oil-zinc oxide  1 application Topical TID  . meclizine  12.5 mg Oral Daily  . sevelamer carbonate  800 mg Oral TID WC   acetaminophen, ondansetron **OR** ondansetron (ZOFRAN) IV, traMADol  Assessment/ Plan:  Ms. ZHANA JEANGILLES is a 79 y.o. black female with end stage renal disease on hemodialysis, dementia, diabetes mellitus type II, hypertension, hypothyroidism, osteomyelitis who is admitted to [name] on 11/03/2019 for Confusion [R41.0] Fever in adult [L87.5] Acute metabolic encephalopathy [I43.32]  [facility] [address] TTS RIJ 79kg.   1. End Stage Renal Disease: seen and examined on hemodialysis treatment. Tolerating treatment well.   2. Hypertension: 142/67 on treatment Home regimen of labetalol  3. Anemia of chronic kidney disease: hemoglobin 8.2 - epo with HD treatment.   4. Secondary Hyperparathyroidism with hyperphophatemia: phosphorus 5.8. outpatient labs from 3/4 show PTH 168, phos 6.3 and calcium 8.1 - Sevelamer with meals.    LOS: 2 Haizel Gatchell 4/3/20219:19 AM

## 2019-11-05 NOTE — Plan of Care (Addendum)
Patient received NO meds prior to going to dialysis this morning.  However, BPs have resembled EXTREME "Yo-yo"/up-down effect while there.  Hosp and Dialysis Dr. Informed and asked to assess   Verbal orders to hold AM Labetolol dose.

## 2019-11-06 LAB — BASIC METABOLIC PANEL
Anion gap: 11 (ref 5–15)
BUN: 28 mg/dL — ABNORMAL HIGH (ref 8–23)
CO2: 26 mmol/L (ref 22–32)
Calcium: 8.1 mg/dL — ABNORMAL LOW (ref 8.9–10.3)
Chloride: 98 mmol/L (ref 98–111)
Creatinine, Ser: 2.83 mg/dL — ABNORMAL HIGH (ref 0.44–1.00)
GFR calc Af Amer: 18 mL/min — ABNORMAL LOW (ref >60)
GFR calc non Af Amer: 16 mL/min — ABNORMAL LOW (ref >60)
Glucose, Bld: 73 mg/dL (ref 70–99)
Potassium: 3.4 mmol/L — ABNORMAL LOW (ref 3.5–5.1)
Sodium: 135 mmol/L (ref 135–145)

## 2019-11-06 LAB — CBC
HCT: 29 % — ABNORMAL LOW (ref 36.0–46.0)
Hemoglobin: 8.5 g/dL — ABNORMAL LOW (ref 12.0–15.0)
MCH: 27.2 pg (ref 26.0–34.0)
MCHC: 29.3 g/dL — ABNORMAL LOW (ref 30.0–36.0)
MCV: 92.7 fL (ref 80.0–100.0)
Platelets: 176 10*3/uL (ref 150–400)
RBC: 3.13 MIL/uL — ABNORMAL LOW (ref 3.87–5.11)
RDW: 17.7 % — ABNORMAL HIGH (ref 11.5–15.5)
WBC: 15.7 10*3/uL — ABNORMAL HIGH (ref 4.0–10.5)
nRBC: 0 % (ref 0.0–0.2)

## 2019-11-06 LAB — MAGNESIUM: Magnesium: 1.6 mg/dL — ABNORMAL LOW (ref 1.7–2.4)

## 2019-11-06 MED ORDER — MAGNESIUM SULFATE 2 GM/50ML IV SOLN
2.0000 g | Freq: Once | INTRAVENOUS | Status: AC
  Administered 2019-11-06: 2 g via INTRAVENOUS
  Filled 2019-11-06: qty 50

## 2019-11-06 NOTE — Plan of Care (Signed)
  Problem: Safety: Goal: Ability to remain free from injury will improve Outcome: Progressing   Problem: Clinical Measurements: Goal: Will remain free from infection 11/06/2019 0347 by Liliane Channel, RN Outcome: Not Progressing Note: WBC from 11.3 to 16.1 data was from 11/05/2019 11/06/2019 0345 by Liliane Channel, RN Outcome: Not Progressing

## 2019-11-06 NOTE — Progress Notes (Signed)
PROGRESS NOTE    Taylor Tyler  VZS:[id number] DOB: July 20, 1941 DOA: 11/03/2019 PCP: McLean-Scocuzza, Nino Glow, MD    Assessment & Plan:   Principal Problem:   Fever in adult Active Problems:   Hypothyroidism   ESRD (end stage renal disease) ([place])   Acute metabolic encephalopathy   Dementia without behavioral disturbance ([place])   History of anemia due to chronic kidney disease    Taylor Tyler is a 79 y.o. AA female with medical history significant for end-stage renal disease on dialysis (T/TH/S), Covid + in December 2020, and on repeat testing done on 10/17/2019 for a procedure which was positive but technically within 90 days of her prior positive test), history of dementia and diabetes mellitus who was sent from SNF to the emergency room for evaluation of fever and confusion above her baseline.  Patient received her second COVID-19 vaccine on 11/02/19 and this morning was noted to be febrile with a temp of 102.8F.  She was said to be less interactive than normal.  At baseline she is normally awake, alert and oriented x 2 but this morning she is lethargic and less responsive.  Her repeat temperature in the emergency room was 100.39F.   Fever and leukocytosis of unclear etiology Patient was Covid positive on 08/01/19 and had a repeat test which was also positive in 10/17/19 (though within 90 days of her initial positive test) Repeat COVID test is negative. Fever may be post vaccination related but in a dialysis patient with a PermCath will want to rule out bacteremia, especially since patient has a high white count, and procal 1.5. --Wounds on the back do not appear infected. --vanc/cefe d/c'ed on 4/3  PLAN: --f/u blood cultures from PermCath --Monitor for fever off of abx.  End-stage renal disease on hemodialysis T/TH/S  Secondary Hyperparathyroidism with hyperphophatemia --nephrology consult for renal replacement therapy during this hospitalization --continue home sevelamer  Acute  metabolic encephalopathy, improved Underlying dementia Most likely related to acute febrile process.  Hypothyroidism Continue Synthroid  Hypertension --BP fluctuating during dialysis --Continue labetalol with hold parameters  Anemia of chronic kidney disease - epo with HD treatment.   Incisional wound over lumbar spine, POA Pressure ulcer stage 1, POA --None look infected. --Wound care consult and wound care per nursing   DVT prophylaxis: Heparin SQ Code Status: Full code  Family Communication:  Disposition Plan: Back to SNF tomorrow, if cx all neg and fever and mental status resolve.   Subjective and Interval History:  Pt had no complaints.  No fever, dyspnea, chest pain, abdominal pain, N/V/D.  Still makes urine and no dysuria.  No pain from her wounds on the back.  Ate well.   Objective: Vitals:   11/05/19 2113 11/06/19 0522 11/06/19 0738 11/06/19 1144  BP:  (!) 127/45 (!) 157/41 (!) 157/45  Pulse: 63 61 60 62  Resp:  18 17 17   Temp:  98.9 F (37.2 C) 98 F (36.7 C) 97.8 F (36.6 C)  TempSrc:  Oral Oral   SpO2:  95% 100% 100%  Weight:  74.7 kg    Height:        Intake/Output Summary (Last 24 hours) at 11/06/2019 1452 Last data filed at 11/06/2019 1407 Gross per 24 hour  Intake --  Output 0 ml  Net 0 ml   Filed Weights   11/03/19 1400 11/05/19 0518 11/06/19 0522  Weight: 83.9 kg 77 kg 74.7 kg    Examination:    Constitutional: NAD, AAOx3, responding to questions  appropriately HEENT: conjunctivae and lids normal, EOMI CV: RRR no M,R,G. Distal pulses +2.  No cyanosis.   RESP: CTA B/L, normal respiratory effort, on 1L GI: +BS, NTND Extremities: No effusions, edema, or tenderness in BLE SKIN: warm, dry. Neuro: II - XII grossly intact.  Sensation intact  Photos taken on 11/04/19 Incisional wound from prior back surgery, 2 cm narrow tunneling at the distal end of the wound.    Superficial pressure ulcers, no fluctuance underneath      Data  Reviewed: I have personally reviewed following labs and imaging studies  CBC: Recent Labs  Lab 11/03/19 0846 11/04/19 0501 11/05/19 0447 11/06/19 0440  WBC 16.5* 11.3* 16.1* 15.7*  NEUTROABS 14.5*  --   --   --   HGB 8.5* 8.8* 8.2* 8.5*  HCT 28.0* 29.5* 26.6* 29.0*  MCV 91.2 92.2 90.2 92.7  PLT 183 156 167 242   Basic Metabolic Panel: Recent Labs  Lab 11/03/19 0846 11/03/19 1429 11/04/19 0501 11/05/19 0447 11/06/19 0440  NA 132*  --  137 134* 135  K 4.8  --  3.8 4.3 3.4*  CL 97*  --  98 97* 98  CO2 23  --  27 27 26   GLUCOSE 94  --  73 76 73  BUN 60*  --  41* 57* 28*  CREATININE 4.57*  --  3.47* 4.28* 2.83*  CALCIUM 8.2*  --  8.2* 8.1* 8.1*  MG  --   --   --  1.6* 1.6*  PHOS  --  3.7  --  5.8*  --    GFR: Estimated Creatinine Clearance: 17.5 mL/min (A) (by C-G formula based on SCr of 2.83 mg/dL (H)). Liver Function Tests: Recent Labs  Lab 11/03/19 0846  AST 30  ALT 11  ALKPHOS 79  BILITOT 0.7  PROT 6.4*  ALBUMIN 2.0*   No results for input(s): LIPASE, AMYLASE in the last 168 hours. No results for input(s): AMMONIA in the last 168 hours. Coagulation Profile: No results for input(s): INR, PROTIME in the last 168 hours. Cardiac Enzymes: No results for input(s): CKTOTAL, CKMB, CKMBINDEX, TROPONINI in the last 168 hours. BNP (last 3 results) No results for input(s): PROBNP in the last 8760 hours. HbA1C: No results for input(s): HGBA1C in the last 72 hours. CBG: No results for input(s): GLUCAP in the last 168 hours. Lipid Profile: No results for input(s): CHOL, HDL, LDLCALC, TRIG, CHOLHDL, LDLDIRECT in the last 72 hours. Thyroid Function Tests: No results for input(s): TSH, T4TOTAL, FREET4, T3FREE, THYROIDAB in the last 72 hours. Anemia Panel: No results for input(s): VITAMINB12, FOLATE, FERRITIN, TIBC, IRON, RETICCTPCT in the last 72 hours. Sepsis Labs: Recent Labs  Lab 11/03/19 0846  PROCALCITON 1.50  LATICACIDVEN 1.1    Recent Results (from the  past 240 hour(s))  Blood Culture (routine x 2)     Status: None (Preliminary result)   Collection Time: 11/03/19  8:46 AM   Specimen: BLOOD  Result Value Ref Range Status   Specimen Description BLOOD BLOOD RIGHT FOREARM  Final   Special Requests   Final    BOTTLES DRAWN AEROBIC AND ANAEROBIC Blood Culture adequate volume   Culture   Final    NO GROWTH 3 DAYS Performed at [facility], [address] [place], [place] [address]    Report Status PENDING  Incomplete  Respiratory Panel by RT PCR (Flu A&B, Covid) - Nasopharyngeal Swab     Status: None   Collection Time: 11/03/19 12:06 PM  Specimen: Nasopharyngeal Swab  Result Value Ref Range Status   SARS Coronavirus 2 by RT PCR NEGATIVE NEGATIVE Final    Comment: (NOTE) SARS-CoV-2 target nucleic acids are NOT DETECTED. The SARS-CoV-2 RNA is generally detectable in upper respiratoy specimens during the acute phase of infection. The lowest concentration of SARS-CoV-2 viral copies this assay can detect is 131 copies/mL. A negative result does not preclude SARS-Cov-2 infection and should not be used as the sole basis for treatment or other patient management decisions. A negative result may occur with  improper specimen collection/handling, submission of specimen other than nasopharyngeal swab, presence of viral mutation(s) within the areas targeted by this assay, and inadequate number of viral copies (<131 copies/mL). A negative result must be combined with clinical observations, patient history, and epidemiological information. The expected result is Negative. Fact Sheet for Patients:  PinkCheek.be Fact Sheet for Healthcare Providers:  GravelBags.it This test is not yet ap proved or cleared by the [place] FDA and  has been authorized for detection and/or diagnosis of SARS-CoV-2 by FDA under an Emergency Use Authorization (EUA). This EUA will remain  in effect  (meaning this test can be used) for the duration of the COVID-19 declaration under Section 564(b)(1) of the Act, 21 U.S.C. section 360bbb-3(b)(1), unless the authorization is terminated or revoked sooner.    Influenza A by PCR NEGATIVE NEGATIVE Final   Influenza B by PCR NEGATIVE NEGATIVE Final    Comment: (NOTE) The Xpert Xpress SARS-CoV-2/FLU/RSV assay is intended as an aid in  the diagnosis of influenza from Nasopharyngeal swab specimens and  should not be used as a sole basis for treatment. Nasal washings and  aspirates are unacceptable for Xpert Xpress SARS-CoV-2/FLU/RSV  testing. Fact Sheet for Patients: PinkCheek.be Fact Sheet for Healthcare Providers: GravelBags.it This test is not yet approved or cleared by the [place] FDA and  has been authorized for detection and/or diagnosis of SARS-CoV-2 by  FDA under an Emergency Use Authorization (EUA). This EUA will remain  in effect (meaning this test can be used) for the duration of the  Covid-19 declaration under Section 564(b)(1) of the Act, 21  U.S.C. section 360bbb-3(b)(1), unless the authorization is  terminated or revoked. Performed at [facility], [address] [place], [place] [address]   Blood Culture (routine x 2)     Status: None (Preliminary result)   Collection Time: 11/03/19  2:28 PM   Specimen: BLOOD  Result Value Ref Range Status   Specimen Description BLOOD A-LINE  Final   Special Requests   Final    BOTTLES DRAWN AEROBIC AND ANAEROBIC Blood Culture adequate volume   Culture   Final    NO GROWTH 3 DAYS Performed at [facility], [address] [place], [place] [address]    Report Status PENDING  Incomplete  MRSA PCR Screening     Status: Abnormal   Collection Time: 11/04/19  6:37 PM   Specimen: Nasal Mucosa; Nasopharyngeal  Result Value Ref Range Status   MRSA by PCR (A) NEGATIVE Final    INVALID, UNABLE TO DETERMINE  THE PRESENCE OF TARGET DUE TO SPECIMEN INTEGRITY. RECOLLECTION REQUESTED.    Comment: C/ MARICAR KIMREY ON 11/04/19 AT 2132 [facility] Performed at [facility], [address] [place], [place] [address]   MRSA PCR Screening     Status: None   Collection Time: 11/04/19 10:33 PM  Result Value Ref Range Status   MRSA by PCR NEGATIVE NEGATIVE Final    Comment:  The GeneXpert MRSA Assay (FDA approved for NASAL specimens only), is one component of a comprehensive MRSA colonization surveillance program. It is not intended to diagnose MRSA infection nor to guide or monitor treatment for MRSA infections. Performed at [facility], [address], [place], [place] [address]       Radiology Studies: No results found.   Scheduled Meds: . acidophilus  1 capsule Oral BID  . atorvastatin  40 mg Oral q1800  . Chlorhexidine Gluconate Cloth  6 each Topical Q0600  . divalproex  125 mg Oral BID  . docusate sodium  100 mg Oral BID  . epoetin (EPOGEN/PROCRIT) injection  4,000 Units Subcutaneous Q T,Th,Sa-HD  . feeding supplement (NEPRO CARB STEADY)  237 mL Oral Q24H  . folic acid  0,761 mcg Oral Daily  . heparin  5,000 Units Subcutaneous Q8H  . labetalol  100 mg Oral BID  . levothyroxine  100 mcg Oral Q0600  . liver oil-zinc oxide  1 application Topical TID  . meclizine  12.5 mg Oral Daily  . sevelamer carbonate  800 mg Oral TID WC   Continuous Infusions:    LOS: 3 days     Enzo Bi, MD [facility] If 7PM-7AM, please contact night-coverage 11/06/2019, 2:52 PM

## 2019-11-06 NOTE — Progress Notes (Signed)
[facility]  ROUNDING NOTE   Subjective:   Pleasantly confused.   Hemodialysis treatment yesterday. Tolerated treatment well. UF of 1.5liters.   Blood pressures are much more stable.   Objective:  Vital signs in last 24 hours:  Temp:  [98 F (36.7 C)-98.9 F (37.2 C)] 98 F (36.7 C) (04/04 0738) Pulse Rate:  [55-64] 60 (04/04 0738) Resp:  [12-21] 17 (04/04 0738) BP: (104-234)/(34-220) 157/41 (04/04 0738) SpO2:  [95 %-100 %] 100 % (04/04 0738) Weight:  [74.7 kg] 74.7 kg (04/04 0522)  Weight change: -2.268 kg Filed Weights   11/03/19 1400 11/05/19 0518 11/06/19 0522  Weight: 83.9 kg 77 kg 74.7 kg    Intake/Output: I/O last 3 completed shifts: In: 240 [P.O.:240] Out: 1500 [Other:1500]   Intake/Output this shift:  No intake/output data recorded.  Physical Exam: General: NAD, laying in bed  Head: Normocephalic, atraumatic. Moist oral mucosal membranes  Eyes: Anicteric, PERRL  Neck: Supple, trachea midline  Lungs:  Clear to auscultation  Heart: Regular rate and rhythm  Abdomen:  Soft, nontender,   Extremities: no peripheral edema.  Neurologic: Alert to self and place only.   Skin: +decubitus ulcers  Access: RIJ permcath    Basic Metabolic Panel: Recent Labs  Lab 11/03/19 0846 11/03/19 0846 11/03/19 1429 11/04/19 0501 11/05/19 0447 11/06/19 0440  NA 132*  --   --  137 134* 135  K 4.8  --   --  3.8 4.3 3.4*  CL 97*  --   --  98 97* 98  CO2 23  --   --  27 27 26   GLUCOSE 94  --   --  73 76 73  BUN 60*  --   --  41* 57* 28*  CREATININE 4.57*  --   --  3.47* 4.28* 2.83*  CALCIUM 8.2*   < >  --  8.2* 8.1* 8.1*  MG  --   --   --   --  1.6* 1.6*  PHOS  --   --  3.7  --  5.8*  --    < > = values in this interval not displayed.    Liver Function Tests: Recent Labs  Lab 11/03/19 0846  AST 30  ALT 11  ALKPHOS 79  BILITOT 0.7  PROT 6.4*  ALBUMIN 2.0*   No results for input(s): LIPASE, AMYLASE in the last 168 hours. No results for  input(s): AMMONIA in the last 168 hours.  CBC: Recent Labs  Lab 11/03/19 0846 11/04/19 0501 11/05/19 0447 11/06/19 0440  WBC 16.5* 11.3* 16.1* 15.7*  NEUTROABS 14.5*  --   --   --   HGB 8.5* 8.8* 8.2* 8.5*  HCT 28.0* 29.5* 26.6* 29.0*  MCV 91.2 92.2 90.2 92.7  PLT 183 156 167 176    Cardiac Enzymes: No results for input(s): CKTOTAL, CKMB, CKMBINDEX, TROPONINI in the last 168 hours.  BNP: Invalid input(s): POCBNP  CBG: No results for input(s): GLUCAP in the last 168 hours.  Microbiology: Results for orders placed or performed during the hospital encounter of 11/03/19  Blood Culture (routine x 2)     Status: None (Preliminary result)   Collection Time: 11/03/19  8:46 AM   Specimen: BLOOD  Result Value Ref Range Status   Specimen Description BLOOD BLOOD RIGHT FOREARM  Final   Special Requests   Final    BOTTLES DRAWN AEROBIC AND ANAEROBIC Blood Culture adequate volume   Culture   Final    NO GROWTH 3  DAYS Performed at [facility], [address] [place],  [place] [address]    Report Status PENDING  Incomplete  Respiratory Panel by RT PCR (Flu A&B, Covid) - Nasopharyngeal Swab     Status: None   Collection Time: 11/03/19 12:06 PM   Specimen: Nasopharyngeal Swab  Result Value Ref Range Status   SARS Coronavirus 2 by RT PCR NEGATIVE NEGATIVE Final    Comment: (NOTE) SARS-CoV-2 target nucleic acids are NOT DETECTED. The SARS-CoV-2 RNA is generally detectable in upper respiratoy specimens during the acute phase of infection. The lowest concentration of SARS-CoV-2 viral copies this assay can detect is 131 copies/mL. A negative result does not preclude SARS-Cov-2 infection and should not be used as the sole basis for treatment or other patient management decisions. A negative result may occur with  improper specimen collection/handling, submission of specimen other than nasopharyngeal swab, presence of viral mutation(s) within the areas targeted by this assay,  and inadequate number of viral copies (<131 copies/mL). A negative result must be combined with clinical observations, patient history, and epidemiological information. The expected result is Negative. Fact Sheet for Patients:  PinkCheek.be Fact Sheet for Healthcare Providers:  GravelBags.it This test is not yet ap proved or cleared by the [place] FDA and  has been authorized for detection and/or diagnosis of SARS-CoV-2 by FDA under an Emergency Use Authorization (EUA). This EUA will remain  in effect (meaning this test can be used) for the duration of the COVID-19 declaration under Section 564(b)(1) of the Act, 21 U.S.C. section 360bbb-3(b)(1), unless the authorization is terminated or revoked sooner.    Influenza A by PCR NEGATIVE NEGATIVE Final   Influenza B by PCR NEGATIVE NEGATIVE Final    Comment: (NOTE) The Xpert Xpress SARS-CoV-2/FLU/RSV assay is intended as an aid in  the diagnosis of influenza from Nasopharyngeal swab specimens and  should not be used as a sole basis for treatment. Nasal washings and  aspirates are unacceptable for Xpert Xpress SARS-CoV-2/FLU/RSV  testing. Fact Sheet for Patients: PinkCheek.be Fact Sheet for Healthcare Providers: GravelBags.it This test is not yet approved or cleared by the [place] FDA and  has been authorized for detection and/or diagnosis of SARS-CoV-2 by  FDA under an Emergency Use Authorization (EUA). This EUA will remain  in effect (meaning this test can be used) for the duration of the  Covid-19 declaration under Section 564(b)(1) of the Act, 21  U.S.C. section 360bbb-3(b)(1), unless the authorization is  terminated or revoked. Performed at [facility], [address] [place], [place] [address]   Blood Culture (routine x 2)     Status: None (Preliminary result)   Collection Time: 11/03/19   2:28 PM   Specimen: BLOOD  Result Value Ref Range Status   Specimen Description BLOOD A-LINE  Final   Special Requests   Final    BOTTLES DRAWN AEROBIC AND ANAEROBIC Blood Culture adequate volume   Culture   Final    NO GROWTH 3 DAYS Performed at [facility], [address] [place], [place] [address]    Report Status PENDING  Incomplete  MRSA PCR Screening     Status: Abnormal   Collection Time: 11/04/19  6:37 PM   Specimen: Nasal Mucosa; Nasopharyngeal  Result Value Ref Range Status   MRSA by PCR (A) NEGATIVE Final    INVALID, UNABLE TO DETERMINE THE PRESENCE OF TARGET DUE TO SPECIMEN INTEGRITY. RECOLLECTION REQUESTED.    Comment: C/ MARICAR KIMREY ON 11/04/19 AT [address] [facility]  Performed at [facility], [address] [place], [place] [address]   MRSA PCR Screening     Status: None   Collection Time: 11/04/19 10:33 PM  Result Value Ref Range Status   MRSA by PCR NEGATIVE NEGATIVE Final    Comment:        The GeneXpert MRSA Assay (FDA approved for NASAL specimens only), is one component of a comprehensive MRSA colonization surveillance program. It is not intended to diagnose MRSA infection nor to guide or monitor treatment for MRSA infections. Performed at [facility], [address] [place], [place] [address]     Coagulation Studies: No results for input(s): LABPROT, INR in the last 72 hours.  Urinalysis: No results for input(s): COLORURINE, LABSPEC, PHURINE, GLUCOSEU, HGBUR, BILIRUBINUR, KETONESUR, PROTEINUR, UROBILINOGEN, NITRITE, LEUKOCYTESUR in the last 72 hours.  Invalid input(s): APPERANCEUR    Imaging: No results found.   Medications:    . acidophilus  1 capsule Oral BID  . atorvastatin  40 mg Oral q1800  . Chlorhexidine Gluconate Cloth  6 each Topical Q0600  . divalproex  125 mg Oral BID  . docusate sodium  100 mg Oral BID  . epoetin (EPOGEN/PROCRIT) injection  4,000 Units Subcutaneous Q T,Th,Sa-HD  . feeding supplement  (NEPRO CARB STEADY)  237 mL Oral Q24H  . folic acid  8,916 mcg Oral Daily  . heparin  5,000 Units Subcutaneous Q8H  . labetalol  100 mg Oral BID  . levothyroxine  100 mcg Oral Q0600  . liver oil-zinc oxide  1 application Topical TID  . meclizine  12.5 mg Oral Daily  . sevelamer carbonate  800 mg Oral TID WC   acetaminophen, ondansetron **OR** ondansetron (ZOFRAN) IV, traMADol  Assessment/ Plan:  [name] is a 79 y.o. black female with end stage renal disease on hemodialysis, dementia, diabetes mellitus type II, hypertension, hypothyroidism, osteomyelitis who is admitted to [facility] on 11/03/2019 for Confusion [R41.0] Fever in adult [X45.0] Acute metabolic encephalopathy [T88.82]  [facility] ([place] TTS RIJ 79kg.   1. End Stage Renal Disease: Continue TTS schedule.   2. Hypertension: elevations on dialysis treatment yesterday. Well controlled this morning.  Home regimen of labetalol  3. Anemia of chronic kidney disease: hemoglobin 8.5 - epo with HD treatment.   4. Secondary Hyperparathyroidism with hyperphophatemia: phosphorus 5.8. outpatient labs from 3/4 show PTH 168, phos 6.3 and calcium 8.1 - Sevelamer with meals.    LOS: 3 Taylor Tyler 4/4/20219:33 AM

## 2019-11-07 LAB — CBC
HCT: 26.9 % — ABNORMAL LOW (ref 36.0–46.0)
Hemoglobin: 7.9 g/dL — ABNORMAL LOW (ref 12.0–15.0)
MCH: 27.2 pg (ref 26.0–34.0)
MCHC: 29.4 g/dL — ABNORMAL LOW (ref 30.0–36.0)
MCV: 92.8 fL (ref 80.0–100.0)
Platelets: 192 10*3/uL (ref 150–400)
RBC: 2.9 MIL/uL — ABNORMAL LOW (ref 3.87–5.11)
RDW: 17.6 % — ABNORMAL HIGH (ref 11.5–15.5)
WBC: 17.2 10*3/uL — ABNORMAL HIGH (ref 4.0–10.5)
nRBC: 0 % (ref 0.0–0.2)

## 2019-11-07 LAB — BASIC METABOLIC PANEL
Anion gap: 10 (ref 5–15)
BUN: 44 mg/dL — ABNORMAL HIGH (ref 8–23)
CO2: 27 mmol/L (ref 22–32)
Calcium: 8.2 mg/dL — ABNORMAL LOW (ref 8.9–10.3)
Chloride: 98 mmol/L (ref 98–111)
Creatinine, Ser: 3.66 mg/dL — ABNORMAL HIGH (ref 0.44–1.00)
GFR calc Af Amer: 13 mL/min — ABNORMAL LOW (ref >60)
GFR calc non Af Amer: 11 mL/min — ABNORMAL LOW (ref >60)
Glucose, Bld: 75 mg/dL (ref 70–99)
Potassium: 3.6 mmol/L (ref 3.5–5.1)
Sodium: 135 mmol/L (ref 135–145)

## 2019-11-07 LAB — MAGNESIUM: Magnesium: 2.3 mg/dL (ref 1.7–2.4)

## 2019-11-07 NOTE — Progress Notes (Signed)
[facility]  ROUNDING NOTE   Subjective:   Pleasantly confused.    Objective:  Vital signs in last 24 hours:  Temp:  [97.6 F (36.4 C)-99.1 F (37.3 C)] 97.8 F (36.6 C) (04/05 1538) Pulse Rate:  [56-64] 60 (04/05 1538) Resp:  [16-19] 16 (04/05 1538) BP: (138-155)/(37-43) 155/37 (04/05 1538) SpO2:  [98 %-100 %] 100 % (04/05 1538) Weight:  [76.7 kg] 76.7 kg (04/05 0406)  Weight change: 1.95 kg Filed Weights   11/05/19 0518 11/06/19 0522 11/07/19 0406  Weight: 77 kg 74.7 kg 76.7 kg    Intake/Output: I/O last 3 completed shifts: In: 120 [P.O.:120] Out: 0    Intake/Output this shift:  No intake/output data recorded.  Physical Exam: General: NAD, laying in bed  Head: Normocephalic, atraumatic. Moist oral mucosal membranes  Eyes: Anicteric, PERRL  Neck: Supple, trachea midline  Lungs:  Clear to auscultation  Heart: Regular rate and rhythm  Abdomen:  Soft, nontender,   Extremities: no peripheral edema.  Neurologic: Alert to self and place only.   Skin: +decubitus ulcers  Access: RIJ permcath    Basic Metabolic Panel: Recent Labs  Lab 11/03/19 0846 11/03/19 0846 11/03/19 1429 11/04/19 0501 11/04/19 0501 11/05/19 0447 11/06/19 0440 11/07/19 0542  NA 132*  --   --  137  --  134* 135 135  K 4.8  --   --  3.8  --  4.3 3.4* 3.6  CL 97*  --   --  98  --  97* 98 98  CO2 23  --   --  27  --  27 26 27   GLUCOSE 94  --   --  73  --  76 73 75  BUN 60*  --   --  41*  --  57* 28* 44*  CREATININE 4.57*  --   --  3.47*  --  4.28* 2.83* 3.66*  CALCIUM 8.2*   < >  --  8.2*   < > 8.1* 8.1* 8.2*  MG  --   --   --   --   --  1.6* 1.6* 2.3  PHOS  --   --  3.7  --   --  5.8*  --   --    < > = values in this interval not displayed.    Liver Function Tests: Recent Labs  Lab 11/03/19 0846  AST 30  ALT 11  ALKPHOS 79  BILITOT 0.7  PROT 6.4*  ALBUMIN 2.0*   No results for input(s): LIPASE, AMYLASE in the last 168 hours. No results for input(s): AMMONIA in  the last 168 hours.  CBC: Recent Labs  Lab 11/03/19 0846 11/04/19 0501 11/05/19 0447 11/06/19 0440 11/07/19 0542  WBC 16.5* 11.3* 16.1* 15.7* 17.2*  NEUTROABS 14.5*  --   --   --   --   HGB 8.5* 8.8* 8.2* 8.5* 7.9*  HCT 28.0* 29.5* 26.6* 29.0* 26.9*  MCV 91.2 92.2 90.2 92.7 92.8  PLT 183 156 167 176 192    Cardiac Enzymes: No results for input(s): CKTOTAL, CKMB, CKMBINDEX, TROPONINI in the last 168 hours.  BNP: Invalid input(s): POCBNP  CBG: No results for input(s): GLUCAP in the last 168 hours.  Microbiology: Results for orders placed or performed during the hospital encounter of 11/03/19  Blood Culture (routine x 2)     Status: None (Preliminary result)   Collection Time: 11/03/19  8:46 AM   Specimen: BLOOD  Result Value Ref Range Status  Specimen Description BLOOD BLOOD RIGHT FOREARM  Final   Special Requests   Final    BOTTLES DRAWN AEROBIC AND ANAEROBIC Blood Culture adequate volume   Culture   Final    NO GROWTH 4 DAYS Performed at [facility], [address], [place], [place] [address]    Report Status PENDING  Incomplete  Respiratory Panel by RT PCR (Flu A&B, Covid) - Nasopharyngeal Swab     Status: None   Collection Time: 11/03/19 12:06 PM   Specimen: Nasopharyngeal Swab  Result Value Ref Range Status   SARS Coronavirus 2 by RT PCR NEGATIVE NEGATIVE Final    Comment: (NOTE) SARS-CoV-2 target nucleic acids are NOT DETECTED. The SARS-CoV-2 RNA is generally detectable in upper respiratoy specimens during the acute phase of infection. The lowest concentration of SARS-CoV-2 viral copies this assay can detect is 131 copies/mL. A negative result does not preclude SARS-Cov-2 infection and should not be used as the sole basis for treatment or other patient management decisions. A negative result may occur with  improper specimen collection/handling, submission of specimen other than nasopharyngeal swab, presence of viral mutation(s) within the areas  targeted by this assay, and inadequate number of viral copies (<131 copies/mL). A negative result must be combined with clinical observations, patient history, and epidemiological information. The expected result is Negative. Fact Sheet for Patients:  PinkCheek.be Fact Sheet for Healthcare Providers:  GravelBags.it This test is not yet ap proved or cleared by the [place] FDA and  has been authorized for detection and/or diagnosis of SARS-CoV-2 by FDA under an Emergency Use Authorization (EUA). This EUA will remain  in effect (meaning this test can be used) for the duration of the COVID-19 declaration under Section 564(b)(1) of the Act, 21 U.S.C. section 360bbb-3(b)(1), unless the authorization is terminated or revoked sooner.    Influenza A by PCR NEGATIVE NEGATIVE Final   Influenza B by PCR NEGATIVE NEGATIVE Final    Comment: (NOTE) The Xpert Xpress SARS-CoV-2/FLU/RSV assay is intended as an aid in  the diagnosis of influenza from Nasopharyngeal swab specimens and  should not be used as a sole basis for treatment. Nasal washings and  aspirates are unacceptable for Xpert Xpress SARS-CoV-2/FLU/RSV  testing. Fact Sheet for Patients: PinkCheek.be Fact Sheet for Healthcare Providers: GravelBags.it This test is not yet approved or cleared by the [place] FDA and  has been authorized for detection and/or diagnosis of SARS-CoV-2 by  FDA under an Emergency Use Authorization (EUA). This EUA will remain  in effect (meaning this test can be used) for the duration of the  Covid-19 declaration under Section 564(b)(1) of the Act, 21  U.S.C. section 360bbb-3(b)(1), unless the authorization is  terminated or revoked. Performed at [facility], [address] [place], [place] [address]   Blood Culture (routine x 2)     Status: None (Preliminary result)    Collection Time: 11/03/19  2:28 PM   Specimen: BLOOD  Result Value Ref Range Status   Specimen Description BLOOD A-LINE  Final   Special Requests   Final    BOTTLES DRAWN AEROBIC AND ANAEROBIC Blood Culture adequate volume   Culture   Final    NO GROWTH 4 DAYS Performed at [facility], [address], [place], [place] [address]    Report Status PENDING  Incomplete  MRSA PCR Screening     Status: Abnormal   Collection Time: 11/04/19  6:37 PM   Specimen: Nasal Mucosa; Nasopharyngeal  Result Value Ref  Range Status   MRSA by PCR (A) NEGATIVE Final    INVALID, UNABLE TO DETERMINE THE PRESENCE OF TARGET DUE TO SPECIMEN INTEGRITY. RECOLLECTION REQUESTED.    Comment: C/ MARICAR KIMREY ON 11/04/19 AT [address] [facility] Performed at [facility], [address] [place], [place] [address]   MRSA PCR Screening     Status: None   Collection Time: 11/04/19 10:33 PM  Result Value Ref Range Status   MRSA by PCR NEGATIVE NEGATIVE Final    Comment:        The GeneXpert MRSA Assay (FDA approved for NASAL specimens only), is one component of a comprehensive MRSA colonization surveillance program. It is not intended to diagnose MRSA infection nor to guide or monitor treatment for MRSA infections. Performed at [facility], [address] [place], [place] [address]     Coagulation Studies: No results for input(s): LABPROT, INR in the last 72 hours.  Urinalysis: No results for input(s): COLORURINE, LABSPEC, PHURINE, GLUCOSEU, HGBUR, BILIRUBINUR, KETONESUR, PROTEINUR, UROBILINOGEN, NITRITE, LEUKOCYTESUR in the last 72 hours.  Invalid input(s): APPERANCEUR    Imaging: No results found.   Medications:    . acidophilus  1 capsule Oral BID  . atorvastatin  40 mg Oral q1800  . Chlorhexidine Gluconate Cloth  6 each Topical Q0600  . divalproex  125 mg Oral BID  . docusate sodium  100 mg Oral BID  . epoetin (EPOGEN/PROCRIT) injection  4,000 Units Subcutaneous Q  T,Th,Sa-HD  . feeding supplement (NEPRO CARB STEADY)  237 mL Oral Q24H  . folic acid  2,536 mcg Oral Daily  . heparin  5,000 Units Subcutaneous Q8H  . labetalol  100 mg Oral BID  . levothyroxine  100 mcg Oral Q0600  . liver oil-zinc oxide  1 application Topical TID  . meclizine  12.5 mg Oral Daily  . sevelamer carbonate  800 mg Oral TID WC   acetaminophen, ondansetron **OR** ondansetron (ZOFRAN) IV, traMADol  Assessment/ Plan:  Taylor Tyler is a 79 y.o. black female with end stage renal disease on hemodialysis, dementia, diabetes mellitus type II, hypertension, hypothyroidism, osteomyelitis who is admitted to [facility] on 11/03/2019 for Confusion [R41.0] Fever in adult [U44.0] Acute metabolic encephalopathy [H47.42]  [facility] ([place]) [facility] TTS RIJ 79kg.   1. End Stage Renal Disease: Continue TTS schedule.   2. Hypertension:  Home regimen of labetalol  3. Anemia of chronic kidney disease:  - epo with HD treatment.   4. Secondary Hyperparathyroidism with hyperphophatemia: phosphorus 5.8. outpatient labs from 3/4 show PTH 168, phos 6.3 and calcium 8.1 - Sevelamer with meals.    LOS: 4 Renad Jenniges 4/5/20215:23 PM

## 2019-11-07 NOTE — Plan of Care (Signed)
  Problem: Clinical Measurements: Goal: Will remain free from infection Outcome: Progressing Note: WBC trending down from 16.1 to 15.7 Goal: Respiratory complications will improve Outcome: Progressing   Problem: Pain Managment: Goal: General experience of comfort will improve Outcome: Progressing   Problem: Safety: Goal: Ability to remain free from injury will improve Outcome: Progressing

## 2019-11-07 NOTE — Progress Notes (Signed)
PROGRESS NOTE    Taylor Tyler  WFU:[id number] DOB: 02-16-1941 DOA: 11/03/2019 PCP: McLean-Scocuzza, Nino Glow, MD    Assessment & Plan:   Principal Problem:   Fever in adult Active Problems:   Hypothyroidism   ESRD (end stage renal disease) ([place])   Acute metabolic encephalopathy   Dementia without behavioral disturbance ([place])   History of anemia due to chronic kidney disease    Taylor Tyler is a 79 y.o. AA female with medical history significant for end-stage renal disease on dialysis (T/TH/S), Covid + in December 2020, and on repeat testing done on 10/17/2019 for a procedure which was positive but technically within 90 days of her prior positive test), history of dementia and diabetes mellitus who was sent from SNF to the emergency room for evaluation of fever and confusion above her baseline.  Patient received her second COVID-19 vaccine on 11/02/19 and this morning was noted to be febrile with a temp of 102.33F.  She was said to be less interactive than normal.  At baseline she is normally awake, alert and oriented x 2 but this morning she is lethargic and less responsive.  Her repeat temperature in the emergency room was 100.61F.   Fever and leukocytosis of unclear etiology Patient was Covid positive on 08/01/19 and had a repeat test which was also positive in 10/17/19 (though within 90 days of her initial positive test) Repeat COVID test is negative. Fever may be post vaccination related but in a dialysis patient with a PermCath will want to rule out bacteremia, especially since patient has a high white count, and procal 1.5. --Wounds on the back do not appear infected. --vanc/cefe d/c'ed on 4/3  PLAN: --f/u blood cultures from PermCath, neg so far --Monitor for fever off of abx. --consult ID today for persistent leukocytosis  End-stage renal disease on hemodialysis T/TH/S  Secondary Hyperparathyroidism with hyperphophatemia --nephrology consult for renal replacement therapy during  this hospitalization --continue home sevelamer  Acute metabolic encephalopathy, improved Underlying dementia Most likely related to acute febrile process.  Hypothyroidism Continue Synthroid  Hypertension --BP fluctuating during dialysis --Continue labetalol with hold parameters  Anemia of chronic kidney disease - epo with HD treatment.   Incisional wound over lumbar spine, POA Pressure ulcer stage 1, POA --None look infected. --Wound care consult and wound care per nursing   DVT prophylaxis: Heparin SQ Code Status: Full code  Family Communication:  Disposition Plan: Back to SNF tomorrow, if cx all neg and fever and mental status resolve.   Subjective and Interval History:  Pt reported doing alright.  No fever, dyspnea, N/V, dysuria.   Objective: Vitals:   11/07/19 0406 11/07/19 0758 11/07/19 1118 11/07/19 1538  BP: (!) 146/43 (!) 139/42 (!) 145/43 (!) 155/37  Pulse: (!) 57 (!) 57 (!) 56 60  Resp: 19 16 16 16   Temp: 98.6 F (37 C) 98.7 F (37.1 C) 97.6 F (36.4 C) 97.8 F (36.6 C)  TempSrc: Oral Oral    SpO2: 100% 100% 100% 100%  Weight: 76.7 kg     Height:        Intake/Output Summary (Last 24 hours) at 11/07/2019 1841 Last data filed at 11/07/2019 1827 Gross per 24 hour  Intake 120 ml  Output 0 ml  Net 120 ml   Filed Weights   11/05/19 0518 11/06/19 0522 11/07/19 0406  Weight: 77 kg 74.7 kg 76.7 kg    Examination:    Constitutional: NAD, AAOx3, responding to questions appropriately HEENT: conjunctivae and lids  normal, EOMI CV: RRR no M,R,G. Distal pulses +2.  No cyanosis.   RESP: CTA B/L, normal respiratory effort, on 1L GI: +BS, NTND Extremities: No effusions, edema, or tenderness in BLE SKIN: warm, dry. Neuro: II - XII grossly intact.  Sensation intact  Photos taken on 11/04/19 Incisional wound from prior back surgery, 2 cm narrow tunneling at the distal end of the wound.    Superficial pressure ulcers, no fluctuance underneath       Data Reviewed: I have personally reviewed following labs and imaging studies  CBC: Recent Labs  Lab 11/03/19 0846 11/04/19 0501 11/05/19 0447 11/06/19 0440 11/07/19 0542  WBC 16.5* 11.3* 16.1* 15.7* 17.2*  NEUTROABS 14.5*  --   --   --   --   HGB 8.5* 8.8* 8.2* 8.5* 7.9*  HCT 28.0* 29.5* 26.6* 29.0* 26.9*  MCV 91.2 92.2 90.2 92.7 92.8  PLT 183 156 167 176 035   Basic Metabolic Panel: Recent Labs  Lab 11/03/19 0846 11/03/19 1429 11/04/19 0501 11/05/19 0447 11/06/19 0440 11/07/19 0542  NA 132*  --  137 134* 135 135  K 4.8  --  3.8 4.3 3.4* 3.6  CL 97*  --  98 97* 98 98  CO2 23  --  27 27 26 27   GLUCOSE 94  --  73 76 73 75  BUN 60*  --  41* 57* 28* 44*  CREATININE 4.57*  --  3.47* 4.28* 2.83* 3.66*  CALCIUM 8.2*  --  8.2* 8.1* 8.1* 8.2*  MG  --   --   --  1.6* 1.6* 2.3  PHOS  --  3.7  --  5.8*  --   --    GFR: Estimated Creatinine Clearance: 13.7 mL/min (A) (by C-G formula based on SCr of 3.66 mg/dL (H)). Liver Function Tests: Recent Labs  Lab 11/03/19 0846  AST 30  ALT 11  ALKPHOS 79  BILITOT 0.7  PROT 6.4*  ALBUMIN 2.0*   No results for input(s): LIPASE, AMYLASE in the last 168 hours. No results for input(s): AMMONIA in the last 168 hours. Coagulation Profile: No results for input(s): INR, PROTIME in the last 168 hours. Cardiac Enzymes: No results for input(s): CKTOTAL, CKMB, CKMBINDEX, TROPONINI in the last 168 hours. BNP (last 3 results) No results for input(s): PROBNP in the last 8760 hours. HbA1C: No results for input(s): HGBA1C in the last 72 hours. CBG: No results for input(s): GLUCAP in the last 168 hours. Lipid Profile: No results for input(s): CHOL, HDL, LDLCALC, TRIG, CHOLHDL, LDLDIRECT in the last 72 hours. Thyroid Function Tests: No results for input(s): TSH, T4TOTAL, FREET4, T3FREE, THYROIDAB in the last 72 hours. Anemia Panel: No results for input(s): VITAMINB12, FOLATE, FERRITIN, TIBC, IRON, RETICCTPCT in the last 72  hours. Sepsis Labs: Recent Labs  Lab 11/03/19 0846  PROCALCITON 1.50  LATICACIDVEN 1.1    Recent Results (from the past 240 hour(s))  Blood Culture (routine x 2)     Status: None (Preliminary result)   Collection Time: 11/03/19  8:46 AM   Specimen: BLOOD  Result Value Ref Range Status   Specimen Description BLOOD BLOOD RIGHT FOREARM  Final   Special Requests   Final    BOTTLES DRAWN AEROBIC AND ANAEROBIC Blood Culture adequate volume   Culture   Final    NO GROWTH 4 DAYS Performed at [facility], [address] [place], [place] [address]    Report Status PENDING  Incomplete  Respiratory Panel by RT PCR (Flu  A&B, Covid) - Nasopharyngeal Swab     Status: None   Collection Time: 11/03/19 12:06 PM   Specimen: Nasopharyngeal Swab  Result Value Ref Range Status   SARS Coronavirus 2 by RT PCR NEGATIVE NEGATIVE Final    Comment: (NOTE) SARS-CoV-2 target nucleic acids are NOT DETECTED. The SARS-CoV-2 RNA is generally detectable in upper respiratoy specimens during the acute phase of infection. The lowest concentration of SARS-CoV-2 viral copies this assay can detect is 131 copies/mL. A negative result does not preclude SARS-Cov-2 infection and should not be used as the sole basis for treatment or other patient management decisions. A negative result may occur with  improper specimen collection/handling, submission of specimen other than nasopharyngeal swab, presence of viral mutation(s) within the areas targeted by this assay, and inadequate number of viral copies (<131 copies/mL). A negative result must be combined with clinical observations, patient history, and epidemiological information. The expected result is Negative. Fact Sheet for Patients:  PinkCheek.be Fact Sheet for Healthcare Providers:  GravelBags.it This test is not yet ap proved or cleared by the [place] FDA and  has been authorized for  detection and/or diagnosis of SARS-CoV-2 by FDA under an Emergency Use Authorization (EUA). This EUA will remain  in effect (meaning this test can be used) for the duration of the COVID-19 declaration under Section 564(b)(1) of the Act, 21 U.S.C. section 360bbb-3(b)(1), unless the authorization is terminated or revoked sooner.    Influenza A by PCR NEGATIVE NEGATIVE Final   Influenza B by PCR NEGATIVE NEGATIVE Final    Comment: (NOTE) The Xpert Xpress SARS-CoV-2/FLU/RSV assay is intended as an aid in  the diagnosis of influenza from Nasopharyngeal swab specimens and  should not be used as a sole basis for treatment. Nasal washings and  aspirates are unacceptable for Xpert Xpress SARS-CoV-2/FLU/RSV  testing. Fact Sheet for Patients: PinkCheek.be Fact Sheet for Healthcare Providers: GravelBags.it This test is not yet approved or cleared by the [place] FDA and  has been authorized for detection and/or diagnosis of SARS-CoV-2 by  FDA under an Emergency Use Authorization (EUA). This EUA will remain  in effect (meaning this test can be used) for the duration of the  Covid-19 declaration under Section 564(b)(1) of the Act, 21  U.S.C. section 360bbb-3(b)(1), unless the authorization is  terminated or revoked. Performed at [facility], [address] [place], [place] [address]   Blood Culture (routine x 2)     Status: None (Preliminary result)   Collection Time: 11/03/19  2:28 PM   Specimen: BLOOD  Result Value Ref Range Status   Specimen Description BLOOD A-LINE  Final   Special Requests   Final    BOTTLES DRAWN AEROBIC AND ANAEROBIC Blood Culture adequate volume   Culture   Final    NO GROWTH 4 DAYS Performed at [facility], [address], [place], [place] [address]    Report Status PENDING  Incomplete  MRSA PCR Screening     Status: Abnormal   Collection Time: 11/04/19  6:37 PM   Specimen:  Nasal Mucosa; Nasopharyngeal  Result Value Ref Range Status   MRSA by PCR (A) NEGATIVE Final    INVALID, UNABLE TO DETERMINE THE PRESENCE OF TARGET DUE TO SPECIMEN INTEGRITY. RECOLLECTION REQUESTED.    Comment: C/ MARICAR KIMREY ON 11/04/19 AT [address] [facility] Performed at [facility], [address], [place], [place] [address]   MRSA PCR Screening     Status: None   Collection Time: 11/04/19  10:33 PM  Result Value Ref Range Status   MRSA by PCR NEGATIVE NEGATIVE Final    Comment:        The GeneXpert MRSA Assay (FDA approved for NASAL specimens only), is one component of a comprehensive MRSA colonization surveillance program. It is not intended to diagnose MRSA infection nor to guide or monitor treatment for MRSA infections. Performed at [facility], [address], [place], [place] [address]       Radiology Studies: No results found.   Scheduled Meds: . acidophilus  1 capsule Oral BID  . atorvastatin  40 mg Oral q1800  . Chlorhexidine Gluconate Cloth  6 each Topical Q0600  . divalproex  125 mg Oral BID  . docusate sodium  100 mg Oral BID  . epoetin (EPOGEN/PROCRIT) injection  4,000 Units Subcutaneous Q T,Th,Sa-HD  . feeding supplement (NEPRO CARB STEADY)  237 mL Oral Q24H  . folic acid  3,646 mcg Oral Daily  . heparin  5,000 Units Subcutaneous Q8H  . labetalol  100 mg Oral BID  . levothyroxine  100 mcg Oral Q0600  . liver oil-zinc oxide  1 application Topical TID  . meclizine  12.5 mg Oral Daily  . sevelamer carbonate  800 mg Oral TID WC   Continuous Infusions:    LOS: 4 days     Enzo Bi, MD [facility] If 7PM-7AM, please contact night-coverage 11/07/2019, 6:41 PM

## 2019-11-07 NOTE — Care Management Important Message (Signed)
Important Message  Patient Details  Name: Taylor Tyler MRN: [id number] Date of Birth: [birth year]   Medicare Important Message Given:  Yes     Dannette Barbara 11/07/2019, 12:12 PM

## 2019-11-08 ENCOUNTER — Inpatient Hospital Stay (HOSPITAL_COMMUNITY)
Admit: 2019-11-08 | Discharge: 2019-11-08 | Disposition: A | Discharge status: Home / Self Care | Payer: Medicare Other | Attending: Hospitalist | Admitting: Hospitalist

## 2019-11-08 DIAGNOSIS — Z91013 Allergy to seafood: Secondary | ICD-10-CM

## 2019-11-08 DIAGNOSIS — R4182 Altered mental status, unspecified: Secondary | ICD-10-CM

## 2019-11-08 DIAGNOSIS — Z888 Allergy status to other drugs, medicaments and biological substances status: Secondary | ICD-10-CM

## 2019-11-08 DIAGNOSIS — Z8616 Personal history of COVID-19: Secondary | ICD-10-CM

## 2019-11-08 DIAGNOSIS — E1122 Type 2 diabetes mellitus with diabetic chronic kidney disease: Secondary | ICD-10-CM

## 2019-11-08 DIAGNOSIS — R509 Fever, unspecified: Principal | ICD-10-CM

## 2019-11-08 DIAGNOSIS — I120 Hypertensive chronic kidney disease with stage 5 chronic kidney disease or end stage renal disease: Secondary | ICD-10-CM

## 2019-11-08 DIAGNOSIS — Z95828 Presence of other vascular implants and grafts: Secondary | ICD-10-CM

## 2019-11-08 DIAGNOSIS — Z91011 Allergy to milk products: Secondary | ICD-10-CM

## 2019-11-08 DIAGNOSIS — Z981 Arthrodesis status: Secondary | ICD-10-CM

## 2019-11-08 DIAGNOSIS — N186 End stage renal disease: Secondary | ICD-10-CM

## 2019-11-08 DIAGNOSIS — D72829 Elevated white blood cell count, unspecified: Secondary | ICD-10-CM

## 2019-11-08 DIAGNOSIS — Z992 Dependence on renal dialysis: Secondary | ICD-10-CM

## 2019-11-08 LAB — CULTURE, BLOOD (ROUTINE X 2)
Culture: NO GROWTH
Culture: NO GROWTH
Special Requests: ADEQUATE
Special Requests: ADEQUATE

## 2019-11-08 LAB — RENAL FUNCTION PANEL
Albumin: 1.6 g/dL — ABNORMAL LOW (ref 3.5–5.0)
Anion gap: 11 (ref 5–15)
BUN: 53 mg/dL — ABNORMAL HIGH (ref 8–23)
CO2: 25 mmol/L (ref 22–32)
Calcium: 8.1 mg/dL — ABNORMAL LOW (ref 8.9–10.3)
Chloride: 96 mmol/L — ABNORMAL LOW (ref 98–111)
Creatinine, Ser: 4.67 mg/dL — ABNORMAL HIGH (ref 0.44–1.00)
GFR calc Af Amer: 10 mL/min — ABNORMAL LOW (ref >60)
GFR calc non Af Amer: 8 mL/min — ABNORMAL LOW (ref >60)
Glucose, Bld: 66 mg/dL — ABNORMAL LOW (ref 70–99)
Phosphorus: 4.2 mg/dL (ref 2.5–4.6)
Potassium: 4.2 mmol/L (ref 3.5–5.1)
Sodium: 132 mmol/L — ABNORMAL LOW (ref 135–145)

## 2019-11-08 LAB — CBC
HCT: 25.6 % — ABNORMAL LOW (ref 36.0–46.0)
HCT: 27.2 % — ABNORMAL LOW (ref 36.0–46.0)
Hemoglobin: 8 g/dL — ABNORMAL LOW (ref 12.0–15.0)
Hemoglobin: 8.2 g/dL — ABNORMAL LOW (ref 12.0–15.0)
MCH: 27.7 pg (ref 26.0–34.0)
MCH: 27.6 pg (ref 26.0–34.0)
MCHC: 31.3 g/dL (ref 30.0–36.0)
MCHC: 30.1 g/dL (ref 30.0–36.0)
MCV: 88.6 fL (ref 80.0–100.0)
MCV: 91.6 fL (ref 80.0–100.0)
Platelets: 204 10*3/uL (ref 150–400)
Platelets: 212 10*3/uL (ref 150–400)
RBC: 2.89 MIL/uL — ABNORMAL LOW (ref 3.87–5.11)
RBC: 2.97 MIL/uL — ABNORMAL LOW (ref 3.87–5.11)
RDW: 17.8 % — ABNORMAL HIGH (ref 11.5–15.5)
RDW: 17.7 % — ABNORMAL HIGH (ref 11.5–15.5)
WBC: 18.2 10*3/uL — ABNORMAL HIGH (ref 4.0–10.5)
WBC: 19.6 10*3/uL — ABNORMAL HIGH (ref 4.0–10.5)
nRBC: 0 % (ref 0.0–0.2)
nRBC: 0 % (ref 0.0–0.2)

## 2019-11-08 LAB — BASIC METABOLIC PANEL
Anion gap: 13 (ref 5–15)
BUN: 53 mg/dL — ABNORMAL HIGH (ref 8–23)
CO2: 25 mmol/L (ref 22–32)
Calcium: 8.2 mg/dL — ABNORMAL LOW (ref 8.9–10.3)
Chloride: 96 mmol/L — ABNORMAL LOW (ref 98–111)
Creatinine, Ser: 4.61 mg/dL — ABNORMAL HIGH (ref 0.44–1.00)
GFR calc Af Amer: 10 mL/min — ABNORMAL LOW (ref >60)
GFR calc non Af Amer: 9 mL/min — ABNORMAL LOW (ref >60)
Glucose, Bld: 68 mg/dL — ABNORMAL LOW (ref 70–99)
Potassium: 4.1 mmol/L (ref 3.5–5.1)
Sodium: 134 mmol/L — ABNORMAL LOW (ref 135–145)

## 2019-11-08 LAB — MAGNESIUM: Magnesium: 2.2 mg/dL (ref 1.7–2.4)

## 2019-11-08 NOTE — Plan of Care (Signed)
  Problem: Education: Goal: Knowledge of General Education information will improve Description Including pain rating scale, medication(s)/side effects and non-pharmacologic comfort measures Outcome: Progressing   

## 2019-11-08 NOTE — Progress Notes (Signed)
[facility]  ROUNDING NOTE   Subjective:     HEMODIALYSIS FLOWSHEET:  Blood Flow Rate (mL/min): 400 mL/min Arterial Pressure (mmHg): -200 mmHg Venous Pressure (mmHg): 180 mmHg Transmembrane Pressure (mmHg): 40 mmHg Ultrafiltration Rate (mL/min): 710 mL/min Dialysate Flow Rate (mL/min): 600 ml/min Conductivity: Machine : 14 Conductivity: Machine : 14 Dialysis Fluid Bolus: Normal Saline Bolus Amount (mL): 250 mL    Objective:  Vital signs in last 24 hours:  Temp:  [97.8 F (36.6 C)-99.6 F (37.6 C)] 98.5 F (36.9 C) (04/06 1315) Pulse Rate:  [51-67] 64 (04/06 1320) Resp:  [12-19] 18 (04/06 1320) BP: (102-166)/(37-85) 131/47 (04/06 1315) SpO2:  [96 %-100 %] 100 % (04/06 1320) Weight:  [76.1 kg] 76.1 kg (04/06 0435)  Weight change: -0.544 kg Filed Weights   11/06/19 0522 11/07/19 0406 11/08/19 0435  Weight: 74.7 kg 76.7 kg 76.1 kg    Intake/Output: I/O last 3 completed shifts: In: 120 [P.O.:120] Out: 0    Intake/Output this shift:  Total I/O In: -  Out: 2000 [Other:2000]  Physical Exam: General: NAD, laying in bed  Head: Normocephalic, atraumatic. Moist oral mucosal membranes  Eyes: Anicteric, PERRL  Neck: Supple, trachea midline  Lungs:  Clear to auscultation  Heart: Regular rate and rhythm  Abdomen:  Soft, nontender,   Extremities: no peripheral edema.  Neurologic: Alert to self and place only.   Skin: +decubitus ulcers  Access: RIJ permcath    Basic Metabolic Panel: Recent Labs  Lab 11/03/19 1429 11/04/19 0501 11/05/19 0447 11/05/19 0447 11/06/19 0440 11/06/19 0440 11/07/19 0542 11/08/19 0509 11/08/19 0849  NA  --    < > 134*  --  135  --  135 134* 132*  K  --    < > 4.3  --  3.4*  --  3.6 4.1 4.2  CL  --    < > 97*  --  98  --  98 96* 96*  CO2  --    < > 27  --  26  --  27 25 25   GLUCOSE  --    < > 76  --  73  --  75 68* 66*  BUN  --    < > 57*  --  28*  --  44* 53* 53*  CREATININE  --    < > 4.28*  --  2.83*  --  3.66*  4.61* 4.67*  CALCIUM  --    < > 8.1*   < > 8.1*   < > 8.2* 8.2* 8.1*  MG  --   --  1.6*  --  1.6*  --  2.3 2.2  --   PHOS 3.7  --  5.8*  --   --   --   --   --  4.2   < > = values in this interval not displayed.    Liver Function Tests: Recent Labs  Lab 11/03/19 0846 11/08/19 0849  AST 30  --   ALT 11  --   ALKPHOS 79  --   BILITOT 0.7  --   PROT 6.4*  --   ALBUMIN 2.0* 1.6*   No results for input(s): LIPASE, AMYLASE in the last 168 hours. No results for input(s): AMMONIA in the last 168 hours.  CBC: Recent Labs  Lab 11/03/19 0846 11/04/19 0501 11/05/19 0447 11/06/19 0440 11/07/19 0542 11/08/19 0509 11/08/19 0849  WBC 16.5*   < > 16.1* 15.7* 17.2* 18.2* 19.6*  NEUTROABS 14.5*  --   --   --   --   --   --  HGB 8.5*   < > 8.2* 8.5* 7.9* 8.0* 8.2*  HCT 28.0*   < > 26.6* 29.0* 26.9* 25.6* 27.2*  MCV 91.2   < > 90.2 92.7 92.8 88.6 91.6  PLT 183   < > 167 176 192 204 212   < > = values in this interval not displayed.    Cardiac Enzymes: No results for input(s): CKTOTAL, CKMB, CKMBINDEX, TROPONINI in the last 168 hours.  BNP: Invalid input(s): POCBNP  CBG: No results for input(s): GLUCAP in the last 168 hours.  Microbiology: Results for orders placed or performed during the hospital encounter of 11/03/19  Blood Culture (routine x 2)     Status: None   Collection Time: 11/03/19  8:46 AM   Specimen: BLOOD  Result Value Ref Range Status   Specimen Description BLOOD BLOOD RIGHT FOREARM  Final   Special Requests   Final    BOTTLES DRAWN AEROBIC AND ANAEROBIC Blood Culture adequate volume   Culture   Final    NO GROWTH 5 DAYS Performed at [facility], [address] [place],  [address]    Report Status 11/08/2019 FINAL  Final  Respiratory Panel by RT PCR (Flu A&B, Covid) - Nasopharyngeal Swab     Status: None   Collection Time: 11/03/19 12:06 PM   Specimen: Nasopharyngeal Swab  Result Value Ref Range Status   SARS Coronavirus 2 by RT PCR  NEGATIVE NEGATIVE Final    Comment: (NOTE) SARS-CoV-2 target nucleic acids are NOT DETECTED. The SARS-CoV-2 RNA is generally detectable in upper respiratoy specimens during the acute phase of infection. The lowest concentration of SARS-CoV-2 viral copies this assay can detect is 131 copies/mL. A negative result does not preclude SARS-Cov-2 infection and should not be used as the sole basis for treatment or other patient management decisions. A negative result may occur with  improper specimen collection/handling, submission of specimen other than nasopharyngeal swab, presence of viral mutation(s) within the areas targeted by this assay, and inadequate number of viral copies (<131 copies/mL). A negative result must be combined with clinical observations, patient history, and epidemiological information. The expected result is Negative. Fact Sheet for Patients:  PinkCheek.be Fact Sheet for Healthcare Providers:  GravelBags.it This test is not yet ap proved or cleared by the [place] FDA and  has been authorized for detection and/or diagnosis of SARS-CoV-2 by FDA under an Emergency Use Authorization (EUA). This EUA will remain  in effect (meaning this test can be used) for the duration of the COVID-19 declaration under Section 564(b)(1) of the Act, 21 U.S.C. section 360bbb-3(b)(1), unless the authorization is terminated or revoked sooner.    Influenza A by PCR NEGATIVE NEGATIVE Final   Influenza B by PCR NEGATIVE NEGATIVE Final    Comment: (NOTE) The Xpert Xpress SARS-CoV-2/FLU/RSV assay is intended as an aid in  the diagnosis of influenza from Nasopharyngeal swab specimens and  should not be used as a sole basis for treatment. Nasal washings and  aspirates are unacceptable for Xpert Xpress SARS-CoV-2/FLU/RSV  testing. Fact Sheet for Patients: PinkCheek.be Fact Sheet for Healthcare  Providers: GravelBags.it This test is not yet approved or cleared by the [place] FDA and  has been authorized for detection and/or diagnosis of SARS-CoV-2 by  FDA under an Emergency Use Authorization (EUA). This EUA will remain  in effect (meaning this test can be used) for the duration of the  Covid-19 declaration under Section 564(b)(1) of the Act, 21  U.S.C. section 360bbb-3(b)(1), unless  the authorization is  terminated or revoked. Performed at [facility], [address] [place], [place] [address]   Blood Culture (routine x 2)     Status: None   Collection Time: 11/03/19  2:28 PM   Specimen: BLOOD  Result Value Ref Range Status   Specimen Description BLOOD A-LINE  Final   Special Requests   Final    BOTTLES DRAWN AEROBIC AND ANAEROBIC Blood Culture adequate volume   Culture   Final    NO GROWTH 5 DAYS Performed at [facility], [address], [place], [place] [address]    Report Status 11/08/2019 FINAL  Final  MRSA PCR Screening     Status: Abnormal   Collection Time: 11/04/19  6:37 PM   Specimen: Nasal Mucosa; Nasopharyngeal  Result Value Ref Range Status   MRSA by PCR (A) NEGATIVE Final    INVALID, UNABLE TO DETERMINE THE PRESENCE OF TARGET DUE TO SPECIMEN INTEGRITY. RECOLLECTION REQUESTED.    Comment: C/ MARICAR KIMREY ON 11/04/19 AT [address] [facility] Performed at [facility], [address] [place], [place] [address]   MRSA PCR Screening     Status: None   Collection Time: 11/04/19 10:33 PM  Result Value Ref Range Status   MRSA by PCR NEGATIVE NEGATIVE Final    Comment:        The GeneXpert MRSA Assay (FDA approved for NASAL specimens only), is one component of a comprehensive MRSA colonization surveillance program. It is not intended to diagnose MRSA infection nor to guide or monitor treatment for MRSA infections. Performed at [facility], [address] [place], [place] [address]      Coagulation Studies: No results for input(s): LABPROT, INR in the last 72 hours.  Urinalysis: No results for input(s): COLORURINE, LABSPEC, PHURINE, GLUCOSEU, HGBUR, BILIRUBINUR, KETONESUR, PROTEINUR, UROBILINOGEN, NITRITE, LEUKOCYTESUR in the last 72 hours.  Invalid input(s): APPERANCEUR    Imaging: No results found.   Medications:    . acidophilus  1 capsule Oral BID  . atorvastatin  40 mg Oral q1800  . Chlorhexidine Gluconate Cloth  6 each Topical Q0600  . divalproex  125 mg Oral BID  . docusate sodium  100 mg Oral BID  . epoetin (EPOGEN/PROCRIT) injection  4,000 Units Subcutaneous Q T,Th,Sa-HD  . feeding supplement (NEPRO CARB STEADY)  237 mL Oral Q24H  . folic acid  2,947 mcg Oral Daily  . heparin  5,000 Units Subcutaneous Q8H  . labetalol  100 mg Oral BID  . levothyroxine  100 mcg Oral Q0600  . liver oil-zinc oxide  1 application Topical TID  . meclizine  12.5 mg Oral Daily  . sevelamer carbonate  800 mg Oral TID WC   acetaminophen, ondansetron **OR** ondansetron (ZOFRAN) IV, traMADol  Assessment/ Plan:  Ms. COLETTE DICAMILLO is a 79 y.o. black female with end stage renal disease on hemodialysis, dementia, diabetes mellitus type II, hypertension, hypothyroidism, osteomyelitis who is admitted to [facility] on 11/03/2019 for Confusion [R41.0] Fever in adult [M54.6] Acute metabolic encephalopathy [T03.54]  [facility] ([place]) [facility] [address] TTS RIJ 79kg.   1. End Stage Renal Disease: Continue TTS schedule.  Seen and examined on hemodialysis treatment.   2. Hypertension:  Home regimen of labetalol  3. Anemia of chronic kidney disease:  - epo with HD treatment.   4. Secondary Hyperparathyroidism with hyperphophatemia: phosphorus 5.8. outpatient labs from 3/4 show PTH 168, phos 6.3 and calcium 8.1 - Sevelamer with meals.    LOS: 5  Pam Vanalstine 4/6/20211:54 PM

## 2019-11-08 NOTE — Progress Notes (Signed)
PROGRESS NOTE    Taylor Tyler  DZH:[id number] DOB: 06-09-1941 DOA: 11/03/2019 PCP: McLean-Scocuzza, Nino Glow, MD    Assessment & Plan:   Principal Problem:   Fever in adult Active Problems:   Hypothyroidism   ESRD (end stage renal disease) ([place])   Acute metabolic encephalopathy   Dementia without behavioral disturbance ([place])   History of anemia due to chronic kidney disease    Taylor Tyler is a 79 y.o. AA female with medical history significant for end-stage renal disease on dialysis (T/TH/S), Covid + in December 2020, and on repeat testing done on 10/17/2019 for a procedure which was positive but technically within 90 days of her prior positive test), history of dementia and diabetes mellitus who was sent from SNF to the emergency room for evaluation of fever and confusion above her baseline.  Patient received her second COVID-19 vaccine on 11/02/19 and this morning was noted to be febrile with a temp of 102.55F.  She was said to be less interactive than normal.  At baseline she is normally awake, alert and oriented x 2 but this morning she is lethargic and less responsive.  Her repeat temperature in the emergency room was 100.7F.   Fever and leukocytosis of unclear etiology Patient was Covid positive on 08/01/19 and had a repeat test which was also positive in 10/17/19 (though within 90 days of her initial positive test) Repeat COVID test is negative. Fever may be post vaccination related but in a dialysis patient with a PermCath will want to rule out bacteremia, especially since patient has a high white count, and procal 1.5. --Wounds on the back do not appear infected. --vanc/cefe d/c'ed on 4/3  PLAN: --f/u blood cultures from PermCath, neg so far --Monitor for fever off of abx. --consult ID today for persistent leukocytosis --TTE to assess for possible vegetation or thrombus attached to PermCath  End-stage renal disease on hemodialysis T/TH/S  Secondary Hyperparathyroidism with  hyperphophatemia --nephrology consult for renal replacement therapy during this hospitalization --continue home sevelamer  Acute metabolic encephalopathy, improved Underlying dementia Most likely related to acute febrile process.  Hypothyroidism Continue Synthroid  Hypertension --BP fluctuating during dialysis --Continue labetalol with hold parameters  Anemia of chronic kidney disease - epo with HD treatment.   Incisional wound over lumbar spine, POA Pressure ulcer stage 1, POA --None look infected. --Wound care consult and wound care per nursing   DVT prophylaxis: Heparin SQ Code Status: Full code  Family Communication: Son [name] updated on the phone today Disposition Plan: Back to SNF tomorrow, if cx all neg and fever and mental status resolve, and TTE wnl.   Subjective and Interval History:  Pt denied pain, dyspnea.  Just hungry while waiting for dialysis to finish.  No fever, chest N/V/D, dysuria.   Objective: Vitals:   11/08/19 1300 11/08/19 1315 11/08/19 1320 11/08/19 1557  BP: (!) 116/45 (!) 131/47  (!) 135/43  Pulse: 63 (!) 59 64 61  Resp: 16 15 18 16   Temp:  98.5 F (36.9 C)  98.3 F (36.8 C)  TempSrc:  Oral    SpO2: 100% 100% 100% 100%  Weight:      Height:        Intake/Output Summary (Last 24 hours) at 11/08/2019 1727 Last data filed at 11/08/2019 1330 Gross per 24 hour  Intake 0 ml  Output 2000 ml  Net -2000 ml   Filed Weights   11/06/19 0522 11/07/19 0406 11/08/19 0435  Weight: 74.7 kg 76.7 kg 76.1  kg    Examination:    Constitutional: NAD, AAOx3, responding to questions appropriately HEENT: conjunctivae and lids normal, EOMI CV: RRR no M,R,G. Distal pulses +2.  No cyanosis.   RESP: CTA B/L, normal respiratory effort, on 1L GI: +BS, NTND Extremities: No effusions, edema, or tenderness in BLE SKIN: warm, dry. Neuro: II - XII grossly intact.  Sensation intact  Photos taken on 11/04/19 Incisional wound from prior back surgery, 2 cm  narrow tunneling at the distal end of the wound.    Superficial pressure ulcers, no fluctuance underneath      Data Reviewed: I have personally reviewed following labs and imaging studies  CBC: Recent Labs  Lab 11/03/19 0846 11/04/19 0501 11/05/19 0447 11/06/19 0440 11/07/19 0542 11/08/19 0509 11/08/19 0849  WBC 16.5*   < > 16.1* 15.7* 17.2* 18.2* 19.6*  NEUTROABS 14.5*  --   --   --   --   --   --   HGB 8.5*   < > 8.2* 8.5* 7.9* 8.0* 8.2*  HCT 28.0*   < > 26.6* 29.0* 26.9* 25.6* 27.2*  MCV 91.2   < > 90.2 92.7 92.8 88.6 91.6  PLT 183   < > 167 176 192 204 212   < > = values in this interval not displayed.   Basic Metabolic Panel: Recent Labs  Lab 11/03/19 1429 11/04/19 0501 11/05/19 0447 11/06/19 0440 11/07/19 0542 11/08/19 0509 11/08/19 0849  NA  --    < > 134* 135 135 134* 132*  K  --    < > 4.3 3.4* 3.6 4.1 4.2  CL  --    < > 97* 98 98 96* 96*  CO2  --    < > 27 26 27 25 25   GLUCOSE  --    < > 76 73 75 68* 66*  BUN  --    < > 57* 28* 44* 53* 53*  CREATININE  --    < > 4.28* 2.83* 3.66* 4.61* 4.67*  CALCIUM  --    < > 8.1* 8.1* 8.2* 8.2* 8.1*  MG  --   --  1.6* 1.6* 2.3 2.2  --   PHOS 3.7  --  5.8*  --   --   --  4.2   < > = values in this interval not displayed.   GFR: Estimated Creatinine Clearance: 10.7 mL/min (A) (by C-G formula based on SCr of 4.67 mg/dL (H)). Liver Function Tests: Recent Labs  Lab 11/03/19 0846 11/08/19 0849  AST 30  --   ALT 11  --   ALKPHOS 79  --   BILITOT 0.7  --   PROT 6.4*  --   ALBUMIN 2.0* 1.6*   No results for input(s): LIPASE, AMYLASE in the last 168 hours. No results for input(s): AMMONIA in the last 168 hours. Coagulation Profile: No results for input(s): INR, PROTIME in the last 168 hours. Cardiac Enzymes: No results for input(s): CKTOTAL, CKMB, CKMBINDEX, TROPONINI in the last 168 hours. BNP (last 3 results) No results for input(s): PROBNP in the last 8760 hours. HbA1C: No results for input(s): HGBA1C in  the last 72 hours. CBG: No results for input(s): GLUCAP in the last 168 hours. Lipid Profile: No results for input(s): CHOL, HDL, LDLCALC, TRIG, CHOLHDL, LDLDIRECT in the last 72 hours. Thyroid Function Tests: No results for input(s): TSH, T4TOTAL, FREET4, T3FREE, THYROIDAB in the last 72 hours. Anemia Panel: No results for input(s): VITAMINB12, FOLATE, FERRITIN, TIBC, IRON, RETICCTPCT  in the last 72 hours. Sepsis Labs: Recent Labs  Lab 11/03/19 0846  PROCALCITON 1.50  LATICACIDVEN 1.1    Recent Results (from the past 240 hour(s))  Blood Culture (routine x 2)     Status: None   Collection Time: 11/03/19  8:46 AM   Specimen: BLOOD  Result Value Ref Range Status   Specimen Description BLOOD BLOOD RIGHT FOREARM  Final   Special Requests   Final    BOTTLES DRAWN AEROBIC AND ANAEROBIC Blood Culture adequate volume   Culture   Final    NO GROWTH 5 DAYS Performed at [facility], [address] [place], [place] [address]    Report Status 11/08/2019 FINAL  Final  Respiratory Panel by RT PCR (Flu A&B, Covid) - Nasopharyngeal Swab     Status: None   Collection Time: 11/03/19 12:06 PM   Specimen: Nasopharyngeal Swab  Result Value Ref Range Status   SARS Coronavirus 2 by RT PCR NEGATIVE NEGATIVE Final    Comment: (NOTE) SARS-CoV-2 target nucleic acids are NOT DETECTED. The SARS-CoV-2 RNA is generally detectable in upper respiratoy specimens during the acute phase of infection. The lowest concentration of SARS-CoV-2 viral copies this assay can detect is 131 copies/mL. A negative result does not preclude SARS-Cov-2 infection and should not be used as the sole basis for treatment or other patient management decisions. A negative result may occur with  improper specimen collection/handling, submission of specimen other than nasopharyngeal swab, presence of viral mutation(s) within the areas targeted by this assay, and inadequate number of viral copies (<131 copies/mL). A  negative result must be combined with clinical observations, patient history, and epidemiological information. The expected result is Negative. Fact Sheet for Patients:  PinkCheek.be Fact Sheet for Healthcare Providers:  GravelBags.it This test is not yet ap proved or cleared by the [place] FDA and  has been authorized for detection and/or diagnosis of SARS-CoV-2 by FDA under an Emergency Use Authorization (EUA). This EUA will remain  in effect (meaning this test can be used) for the duration of the COVID-19 declaration under Section 564(b)(1) of the Act, 21 U.S.C. section 360bbb-3(b)(1), unless the authorization is terminated or revoked sooner.    Influenza A by PCR NEGATIVE NEGATIVE Final   Influenza B by PCR NEGATIVE NEGATIVE Final    Comment: (NOTE) The Xpert Xpress SARS-CoV-2/FLU/RSV assay is intended as an aid in  the diagnosis of influenza from Nasopharyngeal swab specimens and  should not be used as a sole basis for treatment. Nasal washings and  aspirates are unacceptable for Xpert Xpress SARS-CoV-2/FLU/RSV  testing. Fact Sheet for Patients: PinkCheek.be Fact Sheet for Healthcare Providers: GravelBags.it This test is not yet approved or cleared by the [place] FDA and  has been authorized for detection and/or diagnosis of SARS-CoV-2 by  FDA under an Emergency Use Authorization (EUA). This EUA will remain  in effect (meaning this test can be used) for the duration of the  Covid-19 declaration under Section 564(b)(1) of the Act, 21  U.S.C. section 360bbb-3(b)(1), unless the authorization is  terminated or revoked. Performed at [facility], [address], [place],  [address]   Blood Culture (routine x 2)     Status: None   Collection Time: 11/03/19  2:28 PM   Specimen: BLOOD  Result Value Ref Range Status   Specimen Description  BLOOD A-LINE  Final   Special Requests   Final    BOTTLES DRAWN AEROBIC AND ANAEROBIC Blood Culture adequate volume  Culture   Final    NO GROWTH 5 DAYS Performed at [facility], [address] [place], [place] [address]    Report Status 11/08/2019 FINAL  Final  MRSA PCR Screening     Status: Abnormal   Collection Time: 11/04/19  6:37 PM   Specimen: Nasal Mucosa; Nasopharyngeal  Result Value Ref Range Status   MRSA by PCR (A) NEGATIVE Final    INVALID, UNABLE TO DETERMINE THE PRESENCE OF TARGET DUE TO SPECIMEN INTEGRITY. RECOLLECTION REQUESTED.    Comment: C/ MARICAR KIMREY ON 11/04/19 AT [address] [facility] Performed at [facility], [address] [place], [place] [address]   MRSA PCR Screening     Status: None   Collection Time: 11/04/19 10:33 PM  Result Value Ref Range Status   MRSA by PCR NEGATIVE NEGATIVE Final    Comment:        The GeneXpert MRSA Assay (FDA approved for NASAL specimens only), is one component of a comprehensive MRSA colonization surveillance program. It is not intended to diagnose MRSA infection nor to guide or monitor treatment for MRSA infections. Performed at [facility], [address], [place], [place] [address]       Radiology Studies: No results found.   Scheduled Meds: . acidophilus  1 capsule Oral BID  . atorvastatin  40 mg Oral q1800  . Chlorhexidine Gluconate Cloth  6 each Topical Q0600  . divalproex  125 mg Oral BID  . docusate sodium  100 mg Oral BID  . epoetin (EPOGEN/PROCRIT) injection  4,000 Units Subcutaneous Q T,Th,Sa-HD  . feeding supplement (NEPRO CARB STEADY)  237 mL Oral Q24H  . folic acid  9,147 mcg Oral Daily  . heparin  5,000 Units Subcutaneous Q8H  . labetalol  100 mg Oral BID  . levothyroxine  100 mcg Oral Q0600  . liver oil-zinc oxide  1 application Topical TID  . meclizine  12.5 mg Oral Daily  . sevelamer carbonate  800 mg Oral TID WC   Continuous Infusions:    LOS: 5 days      Enzo Bi, MD [facility] If 7PM-7AM, please contact night-coverage 11/08/2019, 5:27 PM

## 2019-11-08 NOTE — Plan of Care (Signed)
  Problem: Education: Goal: Knowledge of General Education information will improve Description: Including pain rating scale, medication(s)/side effects and non-pharmacologic comfort measures 11/08/2019 2353 by Jen Mow, RN Outcome: Progressing 11/08/2019 2352 by Jen Mow, RN Outcome: Progressing

## 2019-11-08 NOTE — Consult Note (Signed)
NAME: Taylor Tyler  DOB: [birth year]  MRN: [id number]  Date/Time: 11/08/2019 12:52 PM  REQUESTING PROVIDER: Huston Foley Subjective:  REASON FOR CONSULT: leucocytosis ? Pt is a poor historian- chart reviewed Taylor Tyler is a 79 y.o. with a history of ESRD, HTN, DM, h/o lumbar fusion, laminectomy 2014, with revision in 2992 complicated by wound  infection admitted from facility on 11/03/19 with fever and altered mental status , following mRNA vaccine 2nd dose which she received 24 hrs earlier. In the ED her vitals were 100.9, BP 121/42, HR 69 Labs revealed a WBC of 16.5, HB 8.5, PLT 183. Blood culture sent CXR no acute disease COVID test was negative The working diagnosis was vaccine induced fever-she had also received vanco and cefepime as she had a permacath and concern for line related infection was raised. The blood culture sent from [facility] and periphery were negative and both antibiotics were discontinued on 4/3. As the leucocytosis is increasing I am asked to see the patient   Medical History  In July 2014 she had  Bilateral L3-4 and L4-5 decompressive laminectomy with L3-4 and L4-5 Coflex posterior instrumentation  In July 2018 she underwent revision She had  removal of L3-4 and L4-5 Coflex devices, re-operative bilateral L3-4 and L4-5 laminectomies with L4-5 Gill procedure, L5-S1 initial laminectomy with facetectomy, posterior segmental instrumented fusion L3-S1  In Aug there was wound dehiscence and on 03/30/17 she underwent Lumbar wound exploration/debridement, placement of wound vac (N/A) - Lumbar wound exploration/debridement, placement of wound vac by Dr.Ditty Proteus in wound culture. initally treated with IV ceftriaxone and then on discharge changed to PO levaquin for 6 weeks- Was followed by [facility]  Finally the wound vac was removed feb 2019.  Saw her PCP in Sept 2019 and the  note mentions positive ANA and SSA, SSB. Was hospitalized in [facility] Jun 04, 2018 with  discharging sinus- transferred to [facility] on 06/05/18 and underwent Exploration, debridement, and closure of lumbar wound by neurosurgeon on 06/06/18. Culture  Again pansensitive proteus- was treated with IV followed by 2 weeks of Po amoxicillin by [facility]. After discharge she was intermittently followed by ID at [facility] and the wound continued to persist.  She was hospitalized again to [facility] in [place] and stayed between 10/26-11/30/20  and on 06/23/2019 underwent   Exploration of lumbar wound Sharp debridement of lumbar wound Removal of previously implanted lumbar hardware Application of wound VAC for closure by Dr.Nundkumar. OR Tissue culture was proteus The surgeon's note reads as follows The  previous hardware was identified including a cross-link, bilateral pedicle screws at L3, L4, L5, and S1, and the bilateral rods.  These were sequentially removed.  Of note, the bilateral L3 screws were noted to be very loose, without any real bony purchase.  The L4 and L5 screws appear to be well secured.  When I remove the left S1 screw we immediately encountered a copious amount of purulent material emanating from the sacrum suggesting chronic infection around this screw.  Pt was  Treated again with IV cefepime and then cefazolin n given during dialysis until 08/04/19 for >8 weeks  She was then diagnosed with COVID in Jan (3rd)  2021  She is now hospitalized because of decreased alertness following 2nd COVID vaccine- She also had a temp of 102.9 A repeat COVID test was negative  She was started on IV antibiotics as wbc was 16. I am asked  to see the patient for worsening leucocytosis. vanco and cfepime were  Dc on 11/05/19   Past Medical History:  Diagnosis Date  . Acute blood loss anemia 03/14/2017  . Acute renal failure superimposed on stage 3 chronic kidney disease ([place]) 03/14/2017  . AKI (acute kidney injury) ([place]) 02/28/2017  . Altered mental status 03/29/2017  . Anemia   . Aortic atherosclerosis ([place])    . Arthritis    "joints might ache at times; not that bad" (06/10/2018)  . Bilateral lower extremity edema 03/04/2017  . Bradycardia   . Chronic kidney disease    ?? renal insufficiency,   . CKD (chronic kidney disease) stage 3, GFR 30-59 ml/min 01/15/2017   ?? renal insufficiency, which she thinks is coming from "all these medications"  . COVID-19    10/17/19  . Dementia ([place])   . Diabetes mellitus without complication ([place])    diagnosed 4-5 yrs ago, 06/21/18- "that was years ago"  . Disease of pancreas   . Diverticulitis    s/p perforation and partial colectomy 01/27/14 with 3 benign lymph nodes   . Diverticulosis 01/15/2017  . DJD (degenerative joint disease)   . DM (diabetes mellitus), type 2 with renal complications ([place]) 1/61/0960  . Elevated ferritin level   . Fatty liver   . Hypertension   . Hypothyroidism    "had radiation" (06/10/2018)  . Kidney disease, chronic, stage V (GFR under 15 ml/min) ([place])    per notes from nursing home-she is on dialysis  . Kidney stone   . Lethargy 02/28/2017  . Obesity, Class III, BMI 40-49.9 (morbid obesity) ([place]) 02/27/2017  . Pleural lipoma   . Postoperative wound infection 04/02/2017  . Spinal stenosis of lumbar region 01/15/2017  . Status post lumbar surgery   . Wound healing, delayed    back    Past Surgical History:  Procedure Laterality Date  . ABDOMINAL EXPOSURE N/A 02/24/2017   Procedure: ABDOMINAL EXPOSURE;  Surgeon: Angelia Mould, MD;  Location: [place];  Service: Vascular;  Laterality: N/A;  . ABDOMINAL HYSTERECTOMY  1987   no h/o abnormal paps   . ANTERIOR LAT LUMBAR FUSION N/A 02/24/2017   Procedure: Lumbar three- five Anterior lateral lumbar interbody fusion;  Surgeon: Ditty, Kevan Ny, MD;  Location: [place];  Service: Neurosurgery;  Laterality: N/A;  L3-5 Anterior lateral lumbar interbody fusion with removal of coflex at L3-4, L4-5  . ANTERIOR LUMBAR FUSION N/A 02/24/2017   Procedure: Stage 1: Lumbar five-Sacral one  Anterior lumbar interbody fusion;  Surgeon: Ditty, Kevan Ny, MD;  Location: [place];  Service: Neurosurgery;  Laterality: N/A;  Stage 1: L5-S1 Anterior lumbar interbody fusion  . APPENDECTOMY    . APPLICATION OF ROBOTIC ASSISTANCE FOR SPINAL PROCEDURE N/A 02/26/2017   Procedure: APPLICATION OF ROBOTIC ASSISTANCE FOR SPINAL PROCEDURE;  Surgeon: Ditty, Kevan Ny, MD;  Location: [place];  Service: Neurosurgery;  Laterality: N/A;  . APPLICATION OF WOUND VAC N/A 03/30/2017   Procedure: APPLICATION OF WOUND VAC;  Surgeon: Ditty, Kevan Ny, MD;  Location: [place];  Service: Neurosurgery;  Laterality: N/A;  . APPLICATION OF WOUND VAC N/A 06/23/2019   Procedure: APPLICATION OF WOUND VAC;  Surgeon: Consuella Lose, MD;  Location: [place];  Service: Neurosurgery;  Laterality: N/A;  . AV FISTULA PLACEMENT Left 06/08/2019   Procedure: Arteriovenous (Av) Fistula Creation;  Surgeon: Rosetta Posner, MD;  Location: [place];  Service: Vascular;  Laterality: Left;  . BACK SURGERY  2013  . CATARACT EXTRACTION W/ INTRAOCULAR LENS IMPLANT Right   . CHOLECYSTECTOMY OPEN  1982  .  COLON SURGERY  01/26/2014   desc.sigmoid colectomy and ventral hernia repair and splenic flexure mobilization  . COLON SURGERY    . DILATION AND CURETTAGE OF UTERUS    . HERNIA REPAIR    . INSERTION OF DIALYSIS CATHETER Right 06/08/2019   Procedure: INSERTION OF DIALYSIS CATHETER RIGHT INTERNAL JUGULAR;  Surgeon: Rosetta Posner, MD;  Location: [place];  Service: Vascular;  Laterality: Right;  . LUMBAR LAMINECTOMY WITH SPINOUS PROCESS PLATE 2 LEVEL N/A 8/56/3149   Procedure: LUMBAR LAMINECTOMY/DECOMPRESSION MICRODISCECTOMY CoFlex;  Surgeon: Faythe Ghee, MD;  [facility]: [facility];  Service: Neurosurgery;  Laterality: N/A;  Lumbar three-four,Lumbar Four-Five Laminectomy with Coflex  . LUMBAR WOUND DEBRIDEMENT N/A 03/30/2017   Procedure: Lumbar wound exploration/debridement, placement of wound vac;  Surgeon: Ditty, Kevan Ny, MD;   Location: [place];  Service: Neurosurgery;  Laterality: N/A;  Lumbar wound exploration/debridement, placement of wound vac  . LUMBAR WOUND DEBRIDEMENT N/A 06/06/2018   Procedure: LUMBAR WOUND DEBRIDEMENT/EXPLORATION;  Surgeon: Consuella Lose, MD;  Location: [place];  Service: Neurosurgery;  Laterality: N/A;  . LUMBAR WOUND DEBRIDEMENT N/A 06/22/2018   Procedure: SIMPLE INCISION AND DRAINAGE OF WOUND, APPLICATION OF WOUND VAC;  Surgeon: Consuella Lose, MD;  Location: [place];  Service: Neurosurgery;  Laterality: N/A;  . LUMBAR WOUND DEBRIDEMENT N/A 06/23/2019   Procedure: LUMBAR WOUND DEBRIDEMENT WITH HARDWARE REMOVAL;  Surgeon: Consuella Lose, MD;  Location: [place];  Service: Neurosurgery;  Laterality: N/A;  . PARTIAL COLECTOMY     01/27/14 diverticulitis and 3 benign lymph nodes [facility] Dr. Pat Patrick   . REDUCTION MAMMAPLASTY Bilateral 1992  . THROMBECTOMY BRACHIAL ARTERY Left 06/08/2019   Procedure: Thrombectomy Brachial Artery;  Surgeon: Rosetta Posner, MD;  Location: [place];  Service: Vascular;  Laterality: Left;  . TUBAL LIGATION    . VENTRAL HERNIA REPAIR  2014    Social History   Socioeconomic History  . Marital status: Widowed    Spouse name: Not on file  . Number of children: Not on file  . Years of education: Not on file  . Highest education level: Not on file  Occupational History  . Occupation: retired Economist work  Tobacco Use  . Smoking status: Never Smoker  . Smokeless tobacco: Never Used  Substance and Sexual Activity  . Alcohol use: Not Currently    Comment: "nothing since age 74" (06/10/2018)  . Drug use: Never  . Sexual activity: Not Currently  Other Topics Concern  . Not on file  Social History Narrative   Admitted to [place] 03/03/17   Widowed since 2012    2 sons [name] comes to appts)    Never smoked   Alcohol none   Full Code   12th grade education retired    Occupational hygienist    Social Determinants of Radio broadcast assistant Strain:   .  Difficulty of Paying Living Expenses:   Food Insecurity:   . Worried About Charity fundraiser in the Last Year:   . Arboriculturist in the Last Year:   Transportation Needs:   . Film/video editor (Medical):   Marland Kitchen Lack of Transportation (Non-Medical):   Physical Activity:   . Days of Exercise per Week:   . Minutes of Exercise per Session:   Stress:   . Feeling of Stress :   Social Connections:   . Frequency of Communication with Friends and Family:   . Frequency of Social Gatherings with Friends and  Family:   . Attends Religious Services:   . Active Member of Clubs or Organizations:   . Attends Archivist Meetings:   Marland Kitchen Marital Status:   Intimate Partner Violence:   . Fear of Current or Ex-Partner:   . Emotionally Abused:   Marland Kitchen Physically Abused:   . Sexually Abused:     Family History  Problem Relation Age of Onset  . Diabetes Father   . Heart attack Father   . Hypertension Father   . Heart disease Father   . Kidney disease Father   . Diabetes Mother   . Hypertension Mother   . Heart disease Mother   . Kidney disease Mother   . Lupus Sister   . Heart disease Brother   . Diabetes Brother   . Heart disease Brother   . Diabetes Brother   . Lupus Sister    Allergies  Allergen Reactions  . Clams [Shellfish Allergy] Swelling and Other (See Comments)    THROAT SWELLS NECK TURNS RED  . Hydralazine Other (See Comments)    CHEST TIGHTNESS Patient has tolerated multiple doses of hydralazine since allergy was listed   . Norvasc [Amlodipine Besylate] Swelling    Leg edema   . Milk-Related Compounds Diarrhea    ? Current Facility-Administered Medications  Medication Dose Route Frequency Provider Last Rate Last Admin  . acetaminophen (TYLENOL) tablet 500 mg  500 mg Oral Daily PRN Agbata, Tochukwu, MD   500 mg at 11/03/19 2156  . acidophilus (RISAQUAD) capsule 1 capsule  1 capsule Oral BID Agbata, Tochukwu, MD   1 capsule at 11/07/19 2229  . atorvastatin  (LIPITOR) tablet 40 mg  40 mg Oral q1800 Agbata, Tochukwu, MD   40 mg at 11/07/19 1758  . Chlorhexidine Gluconate Cloth 2 % PADS 6 each  6 each Topical Q0600 Enzo Bi, MD   6 each at 11/08/19 [phone]  . divalproex (DEPAKOTE SPRINKLE) capsule 125 mg  125 mg Oral BID Agbata, Tochukwu, MD   125 mg at 11/07/19 2232  . docusate sodium (COLACE) capsule 100 mg  100 mg Oral BID Agbata, Tochukwu, MD   100 mg at 11/07/19 2229  . epoetin alfa (EPOGEN) injection 4,000 Units  4,000 Units Subcutaneous Q T,Th,Sa-HD Holley Raring, Munsoor, MD   4,000 Units at 11/05/19 1004  . feeding supplement (NEPRO CARB STEADY) liquid 237 mL  237 mL Oral Q24H Agbata, Tochukwu, MD   237 mL at 11/07/19 1147  . folic acid (FOLVITE) tablet 1 mg  1,000 mcg Oral Daily Agbata, Tochukwu, MD   1 mg at 11/07/19 0850  . heparin injection 5,000 Units  5,000 Units Subcutaneous Q8H Agbata, Tochukwu, MD   5,000 Units at 11/08/19 0545  . labetalol (NORMODYNE) tablet 100 mg  100 mg Oral BID Enzo Bi, MD   100 mg at 11/07/19 2229  . levothyroxine (SYNTHROID) tablet 100 mcg  100 mcg Oral Q0600 Agbata, Tochukwu, MD   100 mcg at 11/08/19 0546  . liver oil-zinc oxide (DESITIN) 40 % ointment 1 application  1 application Topical TID Agbata, Tochukwu, MD   1 application at 73/53/29 2200  . meclizine (ANTIVERT) tablet 12.5 mg  12.5 mg Oral Daily Agbata, Tochukwu, MD   12.5 mg at 11/07/19 0849  . ondansetron (ZOFRAN) tablet 4 mg  4 mg Oral Q6H PRN Agbata, Tochukwu, MD       Or  . ondansetron (ZOFRAN) injection 4 mg  4 mg Intravenous Q6H PRN Agbata, Tochukwu, MD      .  sevelamer carbonate (RENVELA) tablet 800 mg  800 mg Oral TID WC Agbata, Tochukwu, MD   800 mg at 11/07/19 1758  . traMADol (ULTRAM) tablet 25 mg  25 mg Oral Q6H PRN Agbata, Tochukwu, MD   25 mg at 11/06/19 2103     Abtx:  Anti-infectives (From admission, onward)   Start     Dose/Rate Route Frequency Ordered Stop   11/05/19 1200  vancomycin (VANCOCIN) IVPB 1000 mg/200 mL premix  Status:   Discontinued     1,000 mg 200 mL/hr over 60 Minutes Intravenous Every T-Th-Sa (Hemodialysis) 11/03/19 1426 11/05/19 0909   11/04/19 1800  ceFEPIme (MAXIPIME) 1 g in sodium chloride 0.9 % 100 mL IVPB  Status:  Discontinued     1 g 200 mL/hr over 30 Minutes Intravenous Every 24 hours 11/03/19 1426 11/05/19 0909   11/03/19 1015  vancomycin (VANCOREADY) IVPB 750 mg/150 mL     750 mg 150 mL/hr over 60 Minutes Intravenous  Once 11/03/19 0920 11/03/19 1331   11/03/19 0915  ceFEPIme (MAXIPIME) 2 g in sodium chloride 0.9 % 100 mL IVPB     2 g 200 mL/hr over 30 Minutes Intravenous  Once 11/03/19 0908 11/03/19 1015   11/03/19 0915  vancomycin (VANCOCIN) IVPB 1000 mg/200 mL premix     1,000 mg 200 mL/hr over 60 Minutes Intravenous  Once 11/03/19 0908 11/03/19 1119      REVIEW OF SYSTEMS: not reliable Const: denies any  fever, negative chills, negative weight loss Eyes: negative diplopia or visual changes, negative eye pain ENT: negative coryza, negative sore throat Resp: negative cough, hemoptysis, dyspnea Cards: negative for chest pain, palpitations, lower extremity edema GU: negative for frequency, dysuria and hematuria GI: Negative for abdominal pain, diarrhea, bleeding, constipation Skin: negative for rash and pruritus Heme: negative for easy bruising and gum/nose bleeding MS: negative for myalgias, arthralgias, back pain and muscle weakness Neurolo:negative for headaches, dizziness, vertigo, memory problems  Psych: negative for feelings of anxiety, depression  Allergy/Immunology-as mentioned above Objective:  VITALS:  BP (!) 113/46   Pulse (!) 59   Temp 99.5 F (37.5 C) (Axillary)   Resp 16   Ht 5\' 6"  (1.676 m)   Wt 76.1 kg   SpO2 100%   BMI 27.08 kg/m  PHYSICAL EXAM:  General: awake, oriented in person. Answers simple questions Head: Normocephalic, without obvious abnormality, atraumatic. Eyes: Conjunctivae clear, anicteric sclerae. Pupils are equal ENT Nares normal. No  drainage or sinus tenderness. Lips, mucosa, and tongue normal. No Thrush Neck: Supple,  Back:lumar surgical wound Does not look infected- is not deep- shiny     11/03/19    Lungs: b/l air entry Heart: irregular permacath rt IJ Abdomen: Soft, non-tender,not distended. Bowel sounds normal. No masses Extremities: atraumatic, no cyanosis. No edema. No clubbing Skin: hyperpigmentaion skin Lymph: Cervical, supraclavicular normal. Neurologic: did not examine in detail- moves all limbs Pertinent Labs Lab Results CBC    Component Value Date/Time   WBC 19.6 (H) 11/08/2019 0849   RBC 2.97 (L) 11/08/2019 0849   HGB 8.2 (L) 11/08/2019 0849   HGB 9.6 (L) 04/22/2014 0210   HCT 27.2 (L) 11/08/2019 0849   HCT 29.7 (L) 04/22/2014 0210   PLT 212 11/08/2019 0849   PLT 230 04/22/2014 0210   MCV 91.6 11/08/2019 0849   MCV 90 04/22/2014 0210   MCH 27.6 11/08/2019 0849   MCHC 30.1 11/08/2019 0849   RDW 17.7 (H) 11/08/2019 0849   RDW 17.8 (H) 04/22/2014 0210  LYMPHSABS 0.9 11/03/2019 0846   LYMPHSABS 2.0 04/22/2014 0210   MONOABS 0.7 11/03/2019 0846   MONOABS 0.6 04/22/2014 0210   EOSABS 0.0 11/03/2019 0846   EOSABS 0.0 04/22/2014 0210   BASOSABS 0.0 11/03/2019 0846   BASOSABS 0.0 04/22/2014 0210    CMP Latest Ref Rng & Units 11/08/2019 11/08/2019 11/07/2019  Glucose 70 - 99 mg/dL 66(L) 68(L) 75  BUN 8 - 23 mg/dL 53(H) 53(H) 44(H)  Creatinine 0.44 - 1.00 mg/dL 4.67(H) 4.61(H) 3.66(H)  Sodium 135 - 145 mmol/L 132(L) 134(L) 135  Potassium 3.5 - 5.1 mmol/L 4.2 4.1 3.6  Chloride 98 - 111 mmol/L 96(L) 96(L) 98  CO2 22 - 32 mmol/L 25 25 27   Calcium 8.9 - 10.3 mg/dL 8.1(L) 8.2(L) 8.2(L)  Total Protein 6.5 - 8.1 g/dL - - -  Total Bilirubin 0.3 - 1.2 mg/dL - - -  Alkaline Phos 38 - 126 U/L - - -  AST 15 - 41 U/L - - -  ALT 0 - 44 U/L - - -      Microbiology: Recent Results (from the past 240 hour(s))  Blood Culture (routine x 2)     Status: None   Collection Time: 11/03/19  8:46 AM    Specimen: BLOOD  Result Value Ref Range Status   Specimen Description BLOOD BLOOD RIGHT FOREARM  Final   Special Requests   Final    BOTTLES DRAWN AEROBIC AND ANAEROBIC Blood Culture adequate volume   Culture   Final    NO GROWTH 5 DAYS Performed at [facility], [address] [place], [place] [address]    Report Status 11/08/2019 FINAL  Final  Respiratory Panel by RT PCR (Flu A&B, Covid) - Nasopharyngeal Swab     Status: None   Collection Time: 11/03/19 12:06 PM   Specimen: Nasopharyngeal Swab  Result Value Ref Range Status   SARS Coronavirus 2 by RT PCR NEGATIVE NEGATIVE Final    Comment: (NOTE) SARS-CoV-2 target nucleic acids are NOT DETECTED. The SARS-CoV-2 RNA is generally detectable in upper respiratoy specimens during the acute phase of infection. The lowest concentration of SARS-CoV-2 viral copies this assay can detect is 131 copies/mL. A negative result does not preclude SARS-Cov-2 infection and should not be used as the sole basis for treatment or other patient management decisions. A negative result may occur with  improper specimen collection/handling, submission of specimen other than nasopharyngeal swab, presence of viral mutation(s) within the areas targeted by this assay, and inadequate number of viral copies (<131 copies/mL). A negative result must be combined with clinical observations, patient history, and epidemiological information. The expected result is Negative. Fact Sheet for Patients:  PinkCheek.be Fact Sheet for Healthcare Providers:  GravelBags.it This test is not yet ap proved or cleared by the [place] FDA and  has been authorized for detection and/or diagnosis of SARS-CoV-2 by FDA under an Emergency Use Authorization (EUA). This EUA will remain  in effect (meaning this test can be used) for the duration of the COVID-19 declaration under Section 564(b)(1) of the Act, 21 U.S.C.  section 360bbb-3(b)(1), unless the authorization is terminated or revoked sooner.    Influenza A by PCR NEGATIVE NEGATIVE Final   Influenza B by PCR NEGATIVE NEGATIVE Final    Comment: (NOTE) The Xpert Xpress SARS-CoV-2/FLU/RSV assay is intended as an aid in  the diagnosis of influenza from Nasopharyngeal swab specimens and  should not be used as a sole basis for treatment. Nasal washings and  aspirates are  unacceptable for Xpert Xpress SARS-CoV-2/FLU/RSV  testing. Fact Sheet for Patients: PinkCheek.be Fact Sheet for Healthcare Providers: GravelBags.it This test is not yet approved or cleared by the [place] FDA and  has been authorized for detection and/or diagnosis of SARS-CoV-2 by  FDA under an Emergency Use Authorization (EUA). This EUA will remain  in effect (meaning this test can be used) for the duration of the  Covid-19 declaration under Section 564(b)(1) of the Act, 21  U.S.C. section 360bbb-3(b)(1), unless the authorization is  terminated or revoked. Performed at [facility], [address] [place], [place] [address]   Blood Culture (routine x 2)     Status: None   Collection Time: 11/03/19  2:28 PM   Specimen: BLOOD  Result Value Ref Range Status   Specimen Description BLOOD A-LINE  Final   Special Requests   Final    BOTTLES DRAWN AEROBIC AND ANAEROBIC Blood Culture adequate volume   Culture   Final    NO GROWTH 5 DAYS Performed at [facility], [address], [place], [place] [address]    Report Status 11/08/2019 FINAL  Final  MRSA PCR Screening     Status: Abnormal   Collection Time: 11/04/19  6:37 PM   Specimen: Nasal Mucosa; Nasopharyngeal  Result Value Ref Range Status   MRSA by PCR (A) NEGATIVE Final    INVALID, UNABLE TO DETERMINE THE PRESENCE OF TARGET DUE TO SPECIMEN INTEGRITY. RECOLLECTION REQUESTED.    Comment: C/ MARICAR KIMREY ON 11/04/19 AT [address] [facility] Performed at  [facility], [address] [place], [place] [address]   MRSA PCR Screening     Status: None   Collection Time: 11/04/19 10:33 PM  Result Value Ref Range Status   MRSA by PCR NEGATIVE NEGATIVE Final    Comment:        The GeneXpert MRSA Assay (FDA approved for NASAL specimens only), is one component of a comprehensive MRSA colonization surveillance program. It is not intended to diagnose MRSA infection nor to guide or monitor treatment for MRSA infections. Performed at [facility], [address] [place], [place] [address]     IMAGING RESULTS: CXR no acute disease I have personally reviewed the films ? Impression/Recommendation ? Admitted with fever and latered mental status- initally thought to reaction to mRNA vaccine 2nd dose- but as she has permacath- cultures were sent and started on IV vanco and cefepime which were stopped in 48 hrs as culture neg Worsening leucocytosis- Could it be due to permacath or is it the lumbar wound?  The lumbar wound looks clean eventhough not as red as on admission. It is not deep Lumbar hardware infection with proteus recurrent since 2014 and in Nov 2020 all the hardware has been removed and she was treated with > 8 weeks of IV antibiotic As blood culture from permacath and periphery both were negative removal of the line cannot be justified currently- also patient is afebrile  IF leucocytosis persist- may have to restart vanco /ceftaz IV for a short duration -given during dialysis .  ESRD on dialysis? ? ___________________________________________________ Discussed with care team

## 2019-11-09 LAB — BASIC METABOLIC PANEL
Anion gap: 8 (ref 5–15)
BUN: 22 mg/dL (ref 8–23)
CO2: 32 mmol/L (ref 22–32)
Calcium: 7.9 mg/dL — ABNORMAL LOW (ref 8.9–10.3)
Chloride: 97 mmol/L — ABNORMAL LOW (ref 98–111)
Creatinine, Ser: 2.71 mg/dL — ABNORMAL HIGH (ref 0.44–1.00)
GFR calc Af Amer: 19 mL/min — ABNORMAL LOW (ref >60)
GFR calc non Af Amer: 16 mL/min — ABNORMAL LOW (ref >60)
Glucose, Bld: 74 mg/dL (ref 70–99)
Potassium: 3.3 mmol/L — ABNORMAL LOW (ref 3.5–5.1)
Sodium: 137 mmol/L (ref 135–145)

## 2019-11-09 LAB — CBC
HCT: 27.7 % — ABNORMAL LOW (ref 36.0–46.0)
Hemoglobin: 8.2 g/dL — ABNORMAL LOW (ref 12.0–15.0)
MCH: 27.2 pg (ref 26.0–34.0)
MCHC: 29.6 g/dL — ABNORMAL LOW (ref 30.0–36.0)
MCV: 91.7 fL (ref 80.0–100.0)
Platelets: 187 10*3/uL (ref 150–400)
RBC: 3.02 MIL/uL — ABNORMAL LOW (ref 3.87–5.11)
RDW: 17.8 % — ABNORMAL HIGH (ref 11.5–15.5)
WBC: 16.7 10*3/uL — ABNORMAL HIGH (ref 4.0–10.5)
nRBC: 0 % (ref 0.0–0.2)

## 2019-11-09 LAB — ECHOCARDIOGRAM COMPLETE
Height: 66 in
Weight: 2684.8 oz

## 2019-11-09 LAB — MAGNESIUM: Magnesium: 2.1 mg/dL (ref 1.7–2.4)

## 2019-11-09 MED ORDER — MECLIZINE HCL 12.5 MG PO TABS: 12.5000 mg | ORAL_TABLET | Freq: Every day | ORAL | 0 refills | Status: AC | PRN

## 2019-11-09 MED ORDER — EPOETIN ALFA 4000 UNIT/ML IJ SOLN: 4000.0000 [IU] | INTRAMUSCULAR | Status: AC

## 2019-11-09 NOTE — Progress Notes (Signed)
Pt weaned off of oxygen and is 100% on room air, denies sob or distress.

## 2019-11-09 NOTE — Discharge Planning (Addendum)
IV x2 and tele removed.  RN assessment and VS revealed stability for DC back to [facility].  Called report and s/w Leata Mouse, LPN.  Discharge papers printed and placed in packet.  EMS contacted to transport - waiting on arrival.

## 2019-11-09 NOTE — Discharge Summary (Signed)
Physician Discharge Summary   Taylor Tyler  female DOB: 05-12-1941  KXF:[id number]  PCP: McLean-Scocuzza, Nino Glow, MD  Admit date: 11/03/2019 Discharge date:  11/09/2019  Admitted From: SNF Disposition:  SNF CODE STATUS: Full code   Hospital Course:  For full details, please see H&P, progress notes, consult notes and ancillary notes.  Briefly,  Taylor Tyler a 79 y.o.AA femalewith medical history significant forend-stage renal disease on dialysis(T/TH/S),Covid + inDecember 2020, dementia and diabetes mellitus who was sent from SNF to the emergency room for evaluation of fever and confusion above her baseline.Patient received her second COVID-19 vaccine on 03/31/21and next morning was noted to be febrile with a temp of 102.82F. She was said to be less interactive than normal.At baseline she is normally awake,alert and oriented x 2.  Her repeat temperature in the emergency room was 100.44F.   Fever and leukocytosis of unclear etiology Patient was Covid positiveon 08/01/19 andhad a repeat test which was also positivein 10/17/19 (thoughwithin 90 days of her initial positive test).  Repeat COVID test was negative on 11/03/19 on presentation.  Fever may be post vaccination related but in a dialysis patient with a PermCath, a full infectious workup was initiated.  Pt also presented with procal 1.5 and WBC 16.5.  Vanc/cefe were started on presentation.  Blood culturesfrom PermCath neg, CXR clear, not-yet-fully-closed surgical wound on her back appeared clean and non-infected, no infected pressure ulcer present, and pt denied abdominal pain or dysuria.  TTE showed visualized dialysis cath without thrombus or vegetation.   Abx were d/c'ed on 4/3 to monitor pt without abx, and pt remained afebrile, however, WBC remained persistently elevated, with peak of 19.6.  ID was consulted, who recommended outpatient monitoring of WBC with dialysis, and to repeat blood cx (draw from PermCath and  peripheral) if WBC remained elevated.  Arrangements have been made by inpatient nephrologist.  End-stage renal disease on hemodialysis T/TH/S  Secondary Hyperparathyroidism with hyperphophatemia Pt received iHD via home schedule with no issue.  Continued home sevelamer  Acute metabolic encephalopathy, resolved Underlying dementia Most likely related toacute febrile process.  Hypothyroidism Continued Synthroid  Hypertension BP fluctuating during dialysis.  Continued labetalol with hold parameters  Anemia of chronic kidney disease Epo with HD treatment.   Incisional wound over lumbar spine, POA Pressure ulcer stage 1, POA None look infected.  Incisional wound over lumbar spine reportedly had not closed after years.  Wound bed appeared healthy and clean on presentation, with 2 cm narrow tunneling at the distal end of the wound.  Continue wound care per previous orders at the SNF.   Discharge Diagnoses:  Principal Problem:   Fever in adult Active Problems:   Hypothyroidism   ESRD (end stage renal disease) ([place])   Acute metabolic encephalopathy   Dementia without behavioral disturbance (HCC)   History of anemia due to chronic kidney disease    Discharge Instructions:  Allergies as of 11/09/2019      Reactions   Clams [shellfish Allergy] Swelling, Other (See Comments)   THROAT SWELLS NECK TURNS RED   Hydralazine Other (See Comments)   CHEST TIGHTNESS Patient has tolerated multiple doses of hydralazine since allergy was listed    Norvasc [amlodipine Besylate] Swelling   Leg edema    Milk-related Compounds Diarrhea      Medication List    STOP taking these medications   doxycycline 100 MG capsule Commonly known as: VIBRAMYCIN     TAKE these medications   acetaminophen 500 MG  tablet Commonly known as: TYLENOL Take 500 mg by mouth daily as needed for mild pain.   ACIDOPHILUS PO Take 175 mg by mouth 2 (two) times daily.   ATIVAN PO Apply 0.5 mg/mL  topically 2 (two) times daily as needed. 1 ml to skin   atorvastatin 40 MG tablet Commonly known as: LIPITOR Take 1 tablet (40 mg total) by mouth daily at 6 PM.   benzonatate 100 MG capsule Commonly known as: Tessalon Perles Take 1 capsule (100 mg total) by mouth 3 (three) times daily as needed for cough.   citalopram 10 MG tablet Commonly known as: CELEXA Take 10 mg by mouth daily.   divalproex 125 MG capsule Commonly known as: DEPAKOTE SPRINKLE Take 125 mg by mouth 2 (two) times daily.   docusate sodium 100 MG capsule Commonly known as: COLACE Take 100 mg by mouth 2 (two) times daily.   donepezil 5 MG tablet Commonly known as: ARICEPT Take 5 mg by mouth at bedtime.   epoetin alfa 4000 UNIT/ML injection Commonly known as: EPOGEN Inject 1 mL (4,000 Units total) into the skin Every Tuesday,Thursday,and Saturday with dialysis.   feeding supplement (NEPRO CARB STEADY) Liqd Take 237 mLs by mouth daily.   folic acid 557 MCG tablet Commonly known as: FOLVITE Take 400 mcg by mouth daily.   gabapentin 100 MG capsule Commonly known as: NEURONTIN Take 2 capsules (200 mg total) by mouth at bedtime.   Generlac 10 GM/15ML Soln Generic drug: lactulose (encephalopathy) Take 10 g by mouth daily.   labetalol 100 MG tablet Commonly known as: NORMODYNE Take 1 tablet (100 mg total) by mouth 2 (two) times daily.   levothyroxine 100 MCG tablet Commonly known as: SYNTHROID Take 100 mcg by mouth daily before breakfast.   meclizine 12.5 MG tablet Commonly known as: ANTIVERT Take 1 tablet (12.5 mg total) by mouth daily as needed for dizziness. What changed:   when to take this  reasons to take this   memantine 5 MG tablet Commonly known as: NAMENDA Take 5 mg by mouth 2 (two) times daily.   risperiDONE 0.25 MG tablet Commonly known as: RISPERDAL Take 0.25 mg by mouth 2 (two) times daily.   sevelamer carbonate 800 MG tablet Commonly known as: RENVELA Take 1 tablet (800 mg  total) by mouth 3 (three) times daily with meals.   traMADol 50 MG tablet Commonly known as: ULTRAM Take 25 mg by mouth every 6 (six) hours as needed.   zinc oxide 20 % ointment Apply 1 application topically in the morning, at noon, and at bedtime.       Follow-up Information    McLean-Scocuzza, Nino Glow, MD. Schedule an appointment as soon as possible for a visit in 1 week(s).   Specialty: Internal Medicine Contact information: [address] [place] [address] [phone]        Tsosie Billing, MD Follow up.   Specialty: Infectious Diseases Contact information: [place] [address] [phone]           Allergies  Allergen Reactions  . Clams [Shellfish Allergy] Swelling and Other (See Comments)    THROAT SWELLS NECK TURNS RED  . Hydralazine Other (See Comments)    CHEST TIGHTNESS Patient has tolerated multiple doses of hydralazine since allergy was listed   . Norvasc [Amlodipine Besylate] Swelling    Leg edema   . Milk-Related Compounds Diarrhea     The results of significant diagnostics from this hospitalization (including imaging, microbiology, ancillary and  laboratory) are listed below for reference.   Consultations:   Procedures/Studies: DG Chest Port 1 View  Result Date: 11/03/2019 CLINICAL DATA:  Fever. Altered mental status. EXAM: PORTABLE CHEST 1 VIEW COMPARISON:  08/07/2019 FINDINGS: Double lumen central venous catheter in place with the tips in the right atrium, unchanged. Heart size and vascularity are within normal limits considering the AP portable technique. Lungs are clear. Aortic atherosclerosis. No effusions. No acute bone abnormality. IMPRESSION: No acute disease. Electronically Signed   By: Lorriane Shire M.D.   On: 11/03/2019 09:19   ECHOCARDIOGRAM COMPLETE  Result Date: 11/09/2019    ECHOCARDIOGRAM REPORT   Patient Name:   Taylor Tyler Date of Exam: 11/08/2019 Medical Rec #:  [id number]    Height:        66.0 in Accession #:    [id number]   Weight:       167.8 lb Date of Birth:  1940/11/28    BSA:          1.856 m Patient Age:    76 years     BP:           117/39 mmHg Patient Gender: F            HR:           59 bpm. Exam Location:  [facility] Procedure: 2D Echo, Cardiac Doppler and Color Doppler Indications:     Leukocytosis  History:         Patient has prior history of Echocardiogram examinations, most                  recent 06/06/2019. Risk Factors:Hypertension and Diabetes.                  Chronic kidney disease. Hypothyroidism. Fatty Liver. COVID-19.                  Bradycardia. Bilateral lower extremity edema.  Sonographer:     Wilford Sports Rodgers-Jones Referring Phys:  [id number] [place] Diagnosing Phys: Nelva Bush MD IMPRESSIONS  1. Left ventricular ejection fraction, by estimation, is 60 to 65%. The left ventricle has normal function. The left ventricle has no regional wall motion abnormalities. There is severe asymmetric left ventricular hypertrophy of the basal-septal segment. Left ventricular diastolic parameters are consistent with Grade I diastolic dysfunction (impaired relaxation). Elevated left atrial pressure.  2. Right ventricular systolic function is normal. The right ventricular size is normal.  3. The mitral valve is normal in structure. No evidence of mitral valve regurgitation. No evidence of mitral stenosis.  4. The aortic valve is tricuspid. Aortic valve regurgitation is not visualized. Mild aortic valve sclerosis is present, with no evidence of aortic valve stenosis.  5. The inferior vena cava is normal in size with greater than 50% respiratory variability, suggesting right atrial pressure of 3 mmHg. FINDINGS  Left Ventricle: Left ventricular ejection fraction, by estimation, is 60 to 65%. The left ventricle has normal function. The left ventricle has no regional wall motion abnormalities. The left ventricular internal cavity size was normal in size. There is  severe asymmetric left  ventricular hypertrophy of the basal-septal segment. Left ventricular diastolic parameters are consistent with Grade I diastolic dysfunction (impaired relaxation). Elevated left atrial pressure. Right Ventricle: The right ventricular size is normal. No increase in right ventricular wall thickness. Right ventricular systolic function is normal. Left Atrium: Left atrial size was normal in size. Right Atrium: Right atrial size was normal in size. Pericardium: Trivial pericardial  effusion is present. The pericardial effusion is posterior to the left ventricle. Mitral Valve: The mitral valve is normal in structure. No evidence of mitral valve regurgitation. No evidence of mitral valve stenosis. Tricuspid Valve: The tricuspid valve is normal in structure. Tricuspid valve regurgitation is trivial. Aortic Valve: The aortic valve is tricuspid. . There is moderate thickening and mild calcification of the aortic valve. Aortic valve regurgitation is not visualized. Mild aortic valve sclerosis is present, with no evidence of aortic valve stenosis. There  is moderate thickening of the aortic valve. There is mild calcification of the aortic valve. Pulmonic Valve: The pulmonic valve was normal in structure. Pulmonic valve regurgitation is trivial. No evidence of pulmonic stenosis. Aorta: The aortic root is normal in size and structure. Pulmonary Artery: The pulmonary artery is of normal size. Venous: The inferior vena cava is normal in size with greater than 50% respiratory variability, suggesting right atrial pressure of 3 mmHg. IAS/Shunts: The interatrial septum was not well visualized. Additional Comments: A venous catheter is visualized in the right atrium.  LEFT VENTRICLE PLAX 2D LVIDd:         3.05 cm  Diastology LVIDs:         1.97 cm  LV e' lateral:   5.66 cm/s LV PW:         1.21 cm  LV E/e' lateral: 15.0 LV IVS:        1.79 cm  LV e' medial:    5.11 cm/s LVOT diam:     2.10 cm  LV E/e' medial:  16.6 LV SV:         106 LV  SV Index:   57 LVOT Area:     3.46 cm  RIGHT VENTRICLE RV Basal diam:  3.58 cm RV S prime:     14.30 cm/s TAPSE (M-mode): 2.7 cm LEFT ATRIUM             Index       RIGHT ATRIUM           Index LA diam:        4.20 cm 2.26 cm/m  RA Area:     11.20 cm LA Vol (A2C):   61.1 ml 32.92 ml/m RA Volume:   27.40 ml  14.76 ml/m LA Vol (A4C):   50.1 ml 26.99 ml/m LA Biplane Vol: 56.9 ml 30.66 ml/m  AORTIC VALVE LVOT Vmax:   135.00 cm/s LVOT Vmean:  98.200 cm/s LVOT VTI:    0.305 m  AORTA Ao Root diam: 2.90 cm MITRAL VALVE MV Area (PHT): 2.99 cm    SHUNTS MV Decel Time: 254 msec    Systemic VTI:  0.30 m MV E velocity: 84.80 cm/s  Systemic Diam: 2.10 cm MV A velocity: 89.20 cm/s MV E/A ratio:  0.95 Harrell Gave End MD Electronically signed by Nelva Bush MD Signature Date/Time: 11/09/2019/6:57:05 AM    Final       Labs: BNP (last 3 results) Recent Labs    05/30/19 2056  BNP 423.5*   Basic Metabolic Panel: Recent Labs  Lab 11/03/19 1429 11/04/19 0501 11/05/19 0447 11/05/19 0447 11/06/19 0440 11/07/19 0542 11/08/19 0509 11/08/19 0849 11/09/19 0427  NA  --    < > 134*   < > 135 135 134* 132* 137  K  --    < > 4.3   < > 3.4* 3.6 4.1 4.2 3.3*  CL  --    < > 97*   < > 98 98 96* 96*  97*  CO2  --    < > 27   < > 26 27 25 25  32  GLUCOSE  --    < > 76   < > 73 75 68* 66* 74  BUN  --    < > 57*   < > 28* 44* 53* 53* 22  CREATININE  --    < > 4.28*   < > 2.83* 3.66* 4.61* 4.67* 2.71*  CALCIUM  --    < > 8.1*   < > 8.1* 8.2* 8.2* 8.1* 7.9*  MG  --   --  1.6*  --  1.6* 2.3 2.2  --  2.1  PHOS 3.7  --  5.8*  --   --   --   --  4.2  --    < > = values in this interval not displayed.   Liver Function Tests: Recent Labs  Lab 11/03/19 0846 11/08/19 0849  AST 30  --   ALT 11  --   ALKPHOS 79  --   BILITOT 0.7  --   PROT 6.4*  --   ALBUMIN 2.0* 1.6*   No results for input(s): LIPASE, AMYLASE in the last 168 hours. No results for input(s): AMMONIA in the last 168 hours. CBC: Recent Labs  Lab  11/03/19 0846 11/04/19 0501 11/06/19 0440 11/07/19 0542 11/08/19 0509 11/08/19 0849 11/09/19 0427  WBC 16.5*   < > 15.7* 17.2* 18.2* 19.6* 16.7*  NEUTROABS 14.5*  --   --   --   --   --   --   HGB 8.5*   < > 8.5* 7.9* 8.0* 8.2* 8.2*  HCT 28.0*   < > 29.0* 26.9* 25.6* 27.2* 27.7*  MCV 91.2   < > 92.7 92.8 88.6 91.6 91.7  PLT 183   < > 176 192 204 212 187   < > = values in this interval not displayed.   Cardiac Enzymes: No results for input(s): CKTOTAL, CKMB, CKMBINDEX, TROPONINI in the last 168 hours. BNP: Invalid input(s): POCBNP CBG: No results for input(s): GLUCAP in the last 168 hours. D-Dimer No results for input(s): DDIMER in the last 72 hours. Hgb A1c No results for input(s): HGBA1C in the last 72 hours. Lipid Profile No results for input(s): CHOL, HDL, LDLCALC, TRIG, CHOLHDL, LDLDIRECT in the last 72 hours. Thyroid function studies No results for input(s): TSH, T4TOTAL, T3FREE, THYROIDAB in the last 72 hours.  Invalid input(s): FREET3 Anemia work up No results for input(s): VITAMINB12, FOLATE, FERRITIN, TIBC, IRON, RETICCTPCT in the last 72 hours. Urinalysis    Component Value Date/Time   COLORURINE AMBER (A) 06/15/2019 1800   APPEARANCEUR CLOUDY (A) 06/15/2019 1800   APPEARANCEUR Cloudy 12/14/2013 1727   LABSPEC 1.026 06/15/2019 1800   LABSPEC 1.012 12/14/2013 1727   PHURINE 5.0 06/15/2019 1800   GLUCOSEU 50 (A) 06/15/2019 1800   GLUCOSEU NEGATIVE 07/21/2017 1001   HGBUR NEGATIVE 06/15/2019 1800   BILIRUBINUR NEGATIVE 06/15/2019 1800   BILIRUBINUR Negative 12/14/2013 1727   KETONESUR NEGATIVE 06/15/2019 1800   PROTEINUR >=300 (A) 06/15/2019 1800   UROBILINOGEN 0.2 07/21/2017 1001   NITRITE NEGATIVE 06/15/2019 1800   LEUKOCYTESUR MODERATE (A) 06/15/2019 1800   LEUKOCYTESUR 3+ 12/14/2013 1727   Sepsis Labs Invalid input(s): PROCALCITONIN,  WBC,  LACTICIDVEN Microbiology Recent Results (from the past 240 hour(s))  Blood Culture (routine x 2)     Status:  None   Collection Time: 11/03/19  8:46 AM   Specimen: BLOOD  Result Value Ref Range Status   Specimen Description BLOOD BLOOD RIGHT FOREARM  Final   Special Requests   Final    BOTTLES DRAWN AEROBIC AND ANAEROBIC Blood Culture adequate volume   Culture   Final    NO GROWTH 5 DAYS Performed at [facility], [address] [place], [place] [address]    Report Status 11/08/2019 FINAL  Final  Respiratory Panel by RT PCR (Flu A&B, Covid) - Nasopharyngeal Swab     Status: None   Collection Time: 11/03/19 12:06 PM   Specimen: Nasopharyngeal Swab  Result Value Ref Range Status   SARS Coronavirus 2 by RT PCR NEGATIVE NEGATIVE Final    Comment: (NOTE) SARS-CoV-2 target nucleic acids are NOT DETECTED. The SARS-CoV-2 RNA is generally detectable in upper respiratoy specimens during the acute phase of infection. The lowest concentration of SARS-CoV-2 viral copies this assay can detect is 131 copies/mL. A negative result does not preclude SARS-Cov-2 infection and should not be used as the sole basis for treatment or other patient management decisions. A negative result may occur with  improper specimen collection/handling, submission of specimen other than nasopharyngeal swab, presence of viral mutation(s) within the areas targeted by this assay, and inadequate number of viral copies (<131 copies/mL). A negative result must be combined with clinical observations, patient history, and epidemiological information. The expected result is Negative. Fact Sheet for Patients:  PinkCheek.be Fact Sheet for Healthcare Providers:  GravelBags.it This test is not yet ap proved or cleared by the [place] FDA and  has been authorized for detection and/or diagnosis of SARS-CoV-2 by FDA under an Emergency Use Authorization (EUA). This EUA will remain  in effect (meaning this test can be used) for the duration of the COVID-19 declaration  under Section 564(b)(1) of the Act, 21 U.S.C. section 360bbb-3(b)(1), unless the authorization is terminated or revoked sooner.    Influenza A by PCR NEGATIVE NEGATIVE Final   Influenza B by PCR NEGATIVE NEGATIVE Final    Comment: (NOTE) The Xpert Xpress SARS-CoV-2/FLU/RSV assay is intended as an aid in  the diagnosis of influenza from Nasopharyngeal swab specimens and  should not be used as a sole basis for treatment. Nasal washings and  aspirates are unacceptable for Xpert Xpress SARS-CoV-2/FLU/RSV  testing. Fact Sheet for Patients: PinkCheek.be Fact Sheet for Healthcare Providers: GravelBags.it This test is not yet approved or cleared by the [place] FDA and  has been authorized for detection and/or diagnosis of SARS-CoV-2 by  FDA under an Emergency Use Authorization (EUA). This EUA will remain  in effect (meaning this test can be used) for the duration of the  Covid-19 declaration under Section 564(b)(1) of the Act, 21  U.S.C. section 360bbb-3(b)(1), unless the authorization is  terminated or revoked. Performed at [facility], [address] [place], [place] [address]   Blood Culture (routine x 2)     Status: None   Collection Time: 11/03/19  2:28 PM   Specimen: BLOOD  Result Value Ref Range Status   Specimen Description BLOOD A-LINE  Final   Special Requests   Final    BOTTLES DRAWN AEROBIC AND ANAEROBIC Blood Culture adequate volume   Culture   Final    NO GROWTH 5 DAYS Performed at [facility], [address], [place], [place] [address]    Report Status 11/08/2019 FINAL  Final  MRSA PCR Screening     Status: Abnormal   Collection Time: 11/04/19  6:37 PM   Specimen: Nasal  Mucosa; Nasopharyngeal  Result Value Ref Range Status   MRSA by PCR (A) NEGATIVE Final    INVALID, UNABLE TO DETERMINE THE PRESENCE OF TARGET DUE TO SPECIMEN INTEGRITY. RECOLLECTION REQUESTED.    Comment: C/ MARICAR  KIMREY ON 11/04/19 AT [address] [facility] Performed at [facility], [address] [place], [place] [address]   MRSA PCR Screening     Status: None   Collection Time: 11/04/19 10:33 PM  Result Value Ref Range Status   MRSA by PCR NEGATIVE NEGATIVE Final    Comment:        The GeneXpert MRSA Assay (FDA approved for NASAL specimens only), is one component of a comprehensive MRSA colonization surveillance program. It is not intended to diagnose MRSA infection nor to guide or monitor treatment for MRSA infections. Performed at [facility], [address] [place], [place] [address]      Total time spend on discharging this patient, including the last patient exam, discussing the hospital stay, instructions for ongoing care as it relates to all pertinent caregivers, as well as preparing the medical discharge records, prescriptions, and/or referrals as applicable, is 40 minutes.    Enzo Bi, MD  [facility] 11/09/2019, 10:23 AM  If 7PM-7AM, please contact night-coverage

## 2019-11-09 NOTE — Progress Notes (Signed)
[facility]  ROUNDING NOTE   Subjective:   Hemodialysis treatment yesterday. Tolerated treatment well. UF of 2 liters.   Patient is pleasantly confused  Objective:  Vital signs in last 24 hours:  Temp:  [98 F (36.7 C)-98.5 F (36.9 C)] 98 F (36.7 C) (04/07 0732) Pulse Rate:  [58-67] 62 (04/07 0911) Resp:  [12-18] 16 (04/07 0732) BP: (104-135)/(37-48) 128/46 (04/07 0911) SpO2:  [99 %-100 %] 100 % (04/07 0732) Weight:  [74.5 kg] 74.5 kg (04/07 0425)  Weight change: -1.633 kg Filed Weights   11/07/19 0406 11/08/19 0435 11/09/19 0425  Weight: 76.7 kg 76.1 kg 74.5 kg    Intake/Output: I/O last 3 completed shifts: In: 0  Out: 2000 [Other:2000]   Intake/Output this shift:  Total I/O In: 240 [P.O.:240] Out: 0   Physical Exam: General: NAD, laying in bed  Head: Normocephalic, atraumatic. Moist oral mucosal membranes  Eyes: Anicteric, PERRL  Neck: Supple, trachea midline  Lungs:  Clear to auscultation  Heart: Regular rate and rhythm  Abdomen:  Soft, nontender,   Extremities: no peripheral edema.  Neurologic: Alert to self and place only.   Skin: +decubitus ulcers  Access: RIJ permcath    Basic Metabolic Panel: Recent Labs  Lab 11/03/19 1429 11/04/19 0501 11/05/19 0447 11/05/19 0447 11/06/19 0440 11/06/19 0440 11/07/19 0542 11/07/19 0542 11/08/19 0509 11/08/19 0849 11/09/19 0427  NA  --    < > 134*   < > 135  --  135  --  134* 132* 137  K  --    < > 4.3   < > 3.4*  --  3.6  --  4.1 4.2 3.3*  CL  --    < > 97*   < > 98  --  98  --  96* 96* 97*  CO2  --    < > 27   < > 26  --  27  --  25 25 32  GLUCOSE  --    < > 76   < > 73  --  75  --  68* 66* 74  BUN  --    < > 57*   < > 28*  --  44*  --  53* 53* 22  CREATININE  --    < > 4.28*   < > 2.83*  --  3.66*  --  4.61* 4.67* 2.71*  CALCIUM  --    < > 8.1*   < > 8.1*   < > 8.2*   < > 8.2* 8.1* 7.9*  MG  --   --  1.6*  --  1.6*  --  2.3  --  2.2  --  2.1  PHOS 3.7  --  5.8*  --   --   --   --   --    --  4.2  --    < > = values in this interval not displayed.    Liver Function Tests: Recent Labs  Lab 11/03/19 0846 11/08/19 0849  AST 30  --   ALT 11  --   ALKPHOS 79  --   BILITOT 0.7  --   PROT 6.4*  --   ALBUMIN 2.0* 1.6*   No results for input(s): LIPASE, AMYLASE in the last 168 hours. No results for input(s): AMMONIA in the last 168 hours.  CBC: Recent Labs  Lab 11/03/19 0846 11/04/19 0501 11/06/19 0440 11/07/19 0542 11/08/19 0509 11/08/19 0849 11/09/19 0427  WBC 16.5*   < >  15.7* 17.2* 18.2* 19.6* 16.7*  NEUTROABS 14.5*  --   --   --   --   --   --   HGB 8.5*   < > 8.5* 7.9* 8.0* 8.2* 8.2*  HCT 28.0*   < > 29.0* 26.9* 25.6* 27.2* 27.7*  MCV 91.2   < > 92.7 92.8 88.6 91.6 91.7  PLT 183   < > 176 192 204 212 187   < > = values in this interval not displayed.    Cardiac Enzymes: No results for input(s): CKTOTAL, CKMB, CKMBINDEX, TROPONINI in the last 168 hours.  BNP: Invalid input(s): POCBNP  CBG: No results for input(s): GLUCAP in the last 168 hours.  Microbiology: Results for orders placed or performed during the hospital encounter of 11/03/19  Blood Culture (routine x 2)     Status: None   Collection Time: 11/03/19  8:46 AM   Specimen: BLOOD  Result Value Ref Range Status   Specimen Description BLOOD BLOOD RIGHT FOREARM  Final   Special Requests   Final    BOTTLES DRAWN AEROBIC AND ANAEROBIC Blood Culture adequate volume   Culture   Final    NO GROWTH 5 DAYS Performed at [facility], [address] [place], [place] [address]    Report Status 11/08/2019 FINAL  Final  Respiratory Panel by RT PCR (Flu A&B, Covid) - Nasopharyngeal Swab     Status: None   Collection Time: 11/03/19 12:06 PM   Specimen: Nasopharyngeal Swab  Result Value Ref Range Status   SARS Coronavirus 2 by RT PCR NEGATIVE NEGATIVE Final    Comment: (NOTE) SARS-CoV-2 target nucleic acids are NOT DETECTED. The SARS-CoV-2 RNA is generally detectable in upper  respiratoy specimens during the acute phase of infection. The lowest concentration of SARS-CoV-2 viral copies this assay can detect is 131 copies/mL. A negative result does not preclude SARS-Cov-2 infection and should not be used as the sole basis for treatment or other patient management decisions. A negative result may occur with  improper specimen collection/handling, submission of specimen other than nasopharyngeal swab, presence of viral mutation(s) within the areas targeted by this assay, and inadequate number of viral copies (<131 copies/mL). A negative result must be combined with clinical observations, patient history, and epidemiological information. The expected result is Negative. Fact Sheet for Patients:  PinkCheek.be Fact Sheet for Healthcare Providers:  GravelBags.it This test is not yet ap proved or cleared by the [place] FDA and  has been authorized for detection and/or diagnosis of SARS-CoV-2 by FDA under an Emergency Use Authorization (EUA). This EUA will remain  in effect (meaning this test can be used) for the duration of the COVID-19 declaration under Section 564(b)(1) of the Act, 21 U.S.C. section 360bbb-3(b)(1), unless the authorization is terminated or revoked sooner.    Influenza A by PCR NEGATIVE NEGATIVE Final   Influenza B by PCR NEGATIVE NEGATIVE Final    Comment: (NOTE) The Xpert Xpress SARS-CoV-2/FLU/RSV assay is intended as an aid in  the diagnosis of influenza from Nasopharyngeal swab specimens and  should not be used as a sole basis for treatment. Nasal washings and  aspirates are unacceptable for Xpert Xpress SARS-CoV-2/FLU/RSV  testing. Fact Sheet for Patients: PinkCheek.be Fact Sheet for Healthcare Providers: GravelBags.it This test is not yet approved or cleared by the [place] FDA and  has been authorized for  detection and/or diagnosis of SARS-CoV-2 by  FDA under an Emergency Use Authorization (EUA). This EUA will remain  in  effect (meaning this test can be used) for the duration of the  Covid-19 declaration under Section 564(b)(1) of the Act, 21  U.S.C. section 360bbb-3(b)(1), unless the authorization is  terminated or revoked. Performed at [facility], [address] [place], [place] [address]   Blood Culture (routine x 2)     Status: None   Collection Time: 11/03/19  2:28 PM   Specimen: BLOOD  Result Value Ref Range Status   Specimen Description BLOOD A-LINE  Final   Special Requests   Final    BOTTLES DRAWN AEROBIC AND ANAEROBIC Blood Culture adequate volume   Culture   Final    NO GROWTH 5 DAYS Performed at [facility], [address] [place], [place] [address]    Report Status 11/08/2019 FINAL  Final  MRSA PCR Screening     Status: Abnormal   Collection Time: 11/04/19  6:37 PM   Specimen: Nasal Mucosa; Nasopharyngeal  Result Value Ref Range Status   MRSA by PCR (A) NEGATIVE Final    INVALID, UNABLE TO DETERMINE THE PRESENCE OF TARGET DUE TO SPECIMEN INTEGRITY. RECOLLECTION REQUESTED.    Comment: C/ MARICAR KIMREY ON 11/04/19 AT [address] [facility] Performed at [facility], [address] [place], [place] [address]   MRSA PCR Screening     Status: None   Collection Time: 11/04/19 10:33 PM  Result Value Ref Range Status   MRSA by PCR NEGATIVE NEGATIVE Final    Comment:        The GeneXpert MRSA Assay (FDA approved for NASAL specimens only), is one component of a comprehensive MRSA colonization surveillance program. It is not intended to diagnose MRSA infection nor to guide or monitor treatment for MRSA infections. Performed at [facility], [address] [place],  [address]     Coagulation Studies: No results for input(s): LABPROT, INR in the last 72 hours.  Urinalysis: No results for input(s): COLORURINE, LABSPEC, PHURINE,  GLUCOSEU, HGBUR, BILIRUBINUR, KETONESUR, PROTEINUR, UROBILINOGEN, NITRITE, LEUKOCYTESUR in the last 72 hours.  Invalid input(s): APPERANCEUR    Imaging: ECHOCARDIOGRAM COMPLETE  Result Date: 11/09/2019    ECHOCARDIOGRAM REPORT   Patient Name:   Taylor Tyler Date of Exam: 11/08/2019 Medical Rec #:  [id number]    Height:       66.0 in Accession #:    [id number]   Weight:       167.8 lb Date of Birth:  Nov 04, 1940    BSA:          1.856 m Patient Age:    79 years     BP:           117/39 mmHg Patient Gender: F            HR:           59 bpm. Exam Location:  [facility] Procedure: 2D Echo, Cardiac Doppler and Color Doppler Indications:     Leukocytosis  History:         Patient has prior history of Echocardiogram examinations, most                  recent 06/06/2019. Risk Factors:Hypertension and Diabetes.                  Chronic kidney disease. Hypothyroidism. Fatty Liver. COVID-19.                  Bradycardia. Bilateral lower extremity edema.  Sonographer:     NaTashia Rodgers-Jones Referring  Phys:  [id number] Otila Kluver LAI Diagnosing Phys: Nelva Bush MD IMPRESSIONS  1. Left ventricular ejection fraction, by estimation, is 60 to 65%. The left ventricle has normal function. The left ventricle has no regional wall motion abnormalities. There is severe asymmetric left ventricular hypertrophy of the basal-septal segment. Left ventricular diastolic parameters are consistent with Grade I diastolic dysfunction (impaired relaxation). Elevated left atrial pressure.  2. Right ventricular systolic function is normal. The right ventricular size is normal.  3. The mitral valve is normal in structure. No evidence of mitral valve regurgitation. No evidence of mitral stenosis.  4. The aortic valve is tricuspid. Aortic valve regurgitation is not visualized. Mild aortic valve sclerosis is present, with no evidence of aortic valve stenosis.  5. The inferior vena cava is normal in size with greater than 50% respiratory variability, suggesting  right atrial pressure of 3 mmHg. FINDINGS  Left Ventricle: Left ventricular ejection fraction, by estimation, is 60 to 65%. The left ventricle has normal function. The left ventricle has no regional wall motion abnormalities. The left ventricular internal cavity size was normal in size. There is  severe asymmetric left ventricular hypertrophy of the basal-septal segment. Left ventricular diastolic parameters are consistent with Grade I diastolic dysfunction (impaired relaxation). Elevated left atrial pressure. Right Ventricle: The right ventricular size is normal. No increase in right ventricular wall thickness. Right ventricular systolic function is normal. Left Atrium: Left atrial size was normal in size. Right Atrium: Right atrial size was normal in size. Pericardium: Trivial pericardial effusion is present. The pericardial effusion is posterior to the left ventricle. Mitral Valve: The mitral valve is normal in structure. No evidence of mitral valve regurgitation. No evidence of mitral valve stenosis. Tricuspid Valve: The tricuspid valve is normal in structure. Tricuspid valve regurgitation is trivial. Aortic Valve: The aortic valve is tricuspid. . There is moderate thickening and mild calcification of the aortic valve. Aortic valve regurgitation is not visualized. Mild aortic valve sclerosis is present, with no evidence of aortic valve stenosis. There  is moderate thickening of the aortic valve. There is mild calcification of the aortic valve. Pulmonic Valve: The pulmonic valve was normal in structure. Pulmonic valve regurgitation is trivial. No evidence of pulmonic stenosis. Aorta: The aortic root is normal in size and structure. Pulmonary Artery: The pulmonary artery is of normal size. Venous: The inferior vena cava is normal in size with greater than 50% respiratory variability, suggesting right atrial pressure of 3 mmHg. IAS/Shunts: The interatrial septum was not well visualized. Additional Comments: A  venous catheter is visualized in the right atrium.  LEFT VENTRICLE PLAX 2D LVIDd:         3.05 cm  Diastology LVIDs:         1.97 cm  LV e' lateral:   5.66 cm/s LV PW:         1.21 cm  LV E/e' lateral: 15.0 LV IVS:        1.79 cm  LV e' medial:    5.11 cm/s LVOT diam:     2.10 cm  LV E/e' medial:  16.6 LV SV:         106 LV SV Index:   57 LVOT Area:     3.46 cm  RIGHT VENTRICLE RV Basal diam:  3.58 cm RV S prime:     14.30 cm/s TAPSE (M-mode): 2.7 cm LEFT ATRIUM             Index       RIGHT ATRIUM  Index LA diam:        4.20 cm 2.26 cm/m  RA Area:     11.20 cm LA Vol (A2C):   61.1 ml 32.92 ml/m RA Volume:   27.40 ml  14.76 ml/m LA Vol (A4C):   50.1 ml 26.99 ml/m LA Biplane Vol: 56.9 ml 30.66 ml/m  AORTIC VALVE LVOT Vmax:   135.00 cm/s LVOT Vmean:  98.200 cm/s LVOT VTI:    0.305 m  AORTA Ao Root diam: 2.90 cm MITRAL VALVE MV Area (PHT): 2.99 cm    SHUNTS MV Decel Time: 254 msec    Systemic VTI:  0.30 m MV E velocity: 84.80 cm/s  Systemic Diam: 2.10 cm MV A velocity: 89.20 cm/s MV E/A ratio:  0.95 Harrell Gave End MD Electronically signed by Nelva Bush MD Signature Date/Time: 11/09/2019/6:57:05 AM    Final      Medications:    . acidophilus  1 capsule Oral BID  . atorvastatin  40 mg Oral q1800  . Chlorhexidine Gluconate Cloth  6 each Topical Q0600  . divalproex  125 mg Oral BID  . docusate sodium  100 mg Oral BID  . epoetin (EPOGEN/PROCRIT) injection  4,000 Units Subcutaneous Q T,Th,Sa-HD  . feeding supplement (NEPRO CARB STEADY)  237 mL Oral Q24H  . folic acid  2,542 mcg Oral Daily  . heparin  5,000 Units Subcutaneous Q8H  . labetalol  100 mg Oral BID  . levothyroxine  100 mcg Oral Q0600  . liver oil-zinc oxide  1 application Topical TID  . meclizine  12.5 mg Oral Daily  . sevelamer carbonate  800 mg Oral TID WC   acetaminophen, ondansetron **OR** ondansetron (ZOFRAN) IV, traMADol  Assessment/ Plan:  Taylor Tyler is a 79 y.o. black female with end stage renal disease  on hemodialysis, dementia, diabetes mellitus type II, hypertension, hypothyroidism, osteomyelitis who is admitted to [facility] on 11/03/2019 for Confusion [R41.0] Fever in adult [H06.2] Acute metabolic encephalopathy [B76.28]  [facility] ([place]) [facility] [address] TTS RIJ 79kg.   1. End Stage Renal Disease: Continue TTS schedule.   2. Hypertension:  Home regimen of labetalol  3. Anemia of chronic kidney disease: hemoglobin 8.2 - epo with HD treatment.   4. Secondary Hyperparathyroidism with hyperphophatemia: phosphorus 5.8. outpatient labs from 3/4 show PTH 168, phos 6.3 and calcium 8.1 - Sevelamer with meals.   5. Leukocytosis with no evidence of infection.  - Appreciate ID input.  - CBC for next week.    LOS: 6 Day Deery 4/7/202111:14 AM

## 2019-11-09 NOTE — NC FL2 (Signed)
[place] LEVEL OF CARE SCREENING TOOL     IDENTIFICATION  Patient Name: Taylor Tyler Birthdate: 24-Dec-1940 Sex: female Admission Date (Current Location): 11/03/2019  [facility] and [place] Number:  Engineering geologist and Address:  [facility], [address] [place], [place] [address]      Provider Number: [id number]  Attending Physician Name and Address:  Enzo Bi, MD  Relative Name and Phone Number:       Current Level of Care: SNF Recommended Level of Care: Nursing Facility Prior Approval Number:    Date Approved/Denied:   PASRR Number:    Discharge Plan: SNF    Current Diagnoses: Patient Active Problem List   Diagnosis Date Noted  . Acute metabolic encephalopathy 62/83/1517  . Dementia without behavioral disturbance ([place]) 11/03/2019  . History of anemia due to chronic kidney disease 11/03/2019  . ESRD (end stage renal disease) ([place]) 07/22/2019  . Fever in adult   . Pressure injury of skin 06/08/2019  . Acute CHF (congestive heart failure) ([place]) 05/30/2019  . Infection and inflammatory reaction due to internal fixation device of spine, initial encounter ([place]) 06/05/2018  . Neuropathy 01/08/2018  . Fatty liver 01/05/2018  . Chronic pain of both knees 01/05/2018  . Ventral hernia 01/05/2018  . CKD (chronic kidney disease) stage 4, GFR 15-29 ml/min (HCC) 07/21/2017  . Vitamin D deficiency 07/21/2017  . Essential hypertension   . Postoperative wound infection 04/02/2017  . Anemia 03/14/2017  . Acute blood loss anemia 03/14/2017  . Acute renal failure superimposed on stage 3 chronic kidney disease ([place]) 03/14/2017  . Bilateral leg edema 03/04/2017  . Obesity, Class III, BMI 40-49.9 (morbid obesity) ([place]) 02/27/2017  . DM (diabetes mellitus), type 2 with renal complications ([place]) 61/60/7371  . Status post lumbar surgery 02/24/2017  . CKD (chronic kidney disease) stage 3, GFR 30-59 ml/min 01/15/2017  . Hypothyroidism  01/15/2017  . Diverticulosis 01/15/2017  . HLD (hyperlipidemia) 01/15/2017  . Spinal stenosis of lumbar region 01/15/2017  . Disease of pancreas 07/21/2012    Orientation RESPIRATION BLADDER Height & Weight     Self, Situation, Place  Normal Incontinent Weight: 74.5 kg Height:  5\' 6"  (167.6 cm)  BEHAVIORAL SYMPTOMS/MOOD NEUROLOGICAL BOWEL NUTRITION STATUS      Incontinent    AMBULATORY STATUS COMMUNICATION OF NEEDS Skin   Total Care Verbally                         Personal Care Assistance Level of Assistance  Bathing, Dressing Bathing Assistance: Maximum assistance   Dressing Assistance: Maximum assistance     Functional Limitations Info             SPECIAL CARE FACTORS FREQUENCY                       Contractures      Additional Factors Info                  Current Medications (11/09/2019):  This is the current hospital active medication list Current Facility-Administered Medications  Medication Dose Route Frequency Provider Last Rate Last Admin  . acetaminophen (TYLENOL) tablet 500 mg  500 mg Oral Daily PRN Agbata, Tochukwu, MD   500 mg at 11/03/19 2156  . acidophilus (RISAQUAD) capsule 1 capsule  1 capsule Oral BID Agbata, Tochukwu, MD   1 capsule at 11/09/19 0916  . atorvastatin (LIPITOR) tablet 40 mg  40  mg Oral q1800 Agbata, Tochukwu, MD   40 mg at 11/07/19 1758  . Chlorhexidine Gluconate Cloth 2 % PADS 6 each  6 each Topical Q0600 Enzo Bi, MD   6 each at 11/09/19 [phone]  . divalproex (DEPAKOTE SPRINKLE) capsule 125 mg  125 mg Oral BID Agbata, Tochukwu, MD   125 mg at 11/09/19 0912  . docusate sodium (COLACE) capsule 100 mg  100 mg Oral BID Agbata, Tochukwu, MD   100 mg at 11/09/19 0912  . epoetin alfa (EPOGEN) injection 4,000 Units  4,000 Units Subcutaneous Q T,Th,Sa-HD Holley Raring, Munsoor, MD   4,000 Units at 11/05/19 1004  . feeding supplement (NEPRO CARB STEADY) liquid 237 mL  237 mL Oral Q24H Agbata, Tochukwu, MD   237 mL at 11/08/19 1359  .  folic acid (FOLVITE) tablet 1 mg  1,000 mcg Oral Daily Agbata, Tochukwu, MD   1 mg at 11/09/19 0912  . heparin injection 5,000 Units  5,000 Units Subcutaneous Q8H Agbata, Tochukwu, MD   5,000 Units at 11/09/19 0513  . labetalol (NORMODYNE) tablet 100 mg  100 mg Oral BID Enzo Bi, MD   100 mg at 11/09/19 0912  . levothyroxine (SYNTHROID) tablet 100 mcg  100 mcg Oral Q0600 Agbata, Tochukwu, MD   100 mcg at 11/09/19 0514  . liver oil-zinc oxide (DESITIN) 40 % ointment 1 application  1 application Topical TID Agbata, Tochukwu, MD   1 application at 88/32/54 0913  . meclizine (ANTIVERT) tablet 12.5 mg  12.5 mg Oral Daily Agbata, Tochukwu, MD   12.5 mg at 11/09/19 0911  . ondansetron (ZOFRAN) tablet 4 mg  4 mg Oral Q6H PRN Agbata, Tochukwu, MD       Or  . ondansetron (ZOFRAN) injection 4 mg  4 mg Intravenous Q6H PRN Agbata, Tochukwu, MD      . sevelamer carbonate (RENVELA) tablet 800 mg  800 mg Oral TID WC Agbata, Tochukwu, MD   800 mg at 11/09/19 0912  . traMADol (ULTRAM) tablet 25 mg  25 mg Oral Q6H PRN Agbata, Tochukwu, MD   25 mg at 11/06/19 2103     Discharge Medications: Please see discharge summary for a list of discharge medications.  Relevant Imaging Results:  Relevant Lab Results:   Additional Information SSN: [id number]; Hemodialysis  Victorino Dike, RN

## 2019-11-09 NOTE — Progress Notes (Signed)
Tele called and reported 6 beats vtach.  Pt lying in bed with eyes open, denies feeling anything and denies any distress.  Vs's are stable.

## 2019-11-10 ENCOUNTER — Telehealth: Payer: Self-pay | Admitting: Internal Medicine

## 2019-11-10 DIAGNOSIS — N2581 Secondary hyperparathyroidism of renal origin: Secondary | ICD-10-CM | POA: Diagnosis not present

## 2019-11-10 DIAGNOSIS — D509 Iron deficiency anemia, unspecified: Secondary | ICD-10-CM | POA: Diagnosis not present

## 2019-11-10 DIAGNOSIS — D631 Anemia in chronic kidney disease: Secondary | ICD-10-CM | POA: Diagnosis not present

## 2019-11-10 DIAGNOSIS — D689 Coagulation defect, unspecified: Secondary | ICD-10-CM | POA: Diagnosis not present

## 2019-11-10 DIAGNOSIS — Z992 Dependence on renal dialysis: Secondary | ICD-10-CM | POA: Diagnosis not present

## 2019-11-10 DIAGNOSIS — N186 End stage renal disease: Secondary | ICD-10-CM | POA: Diagnosis not present

## 2019-11-10 NOTE — Telephone Encounter (Signed)
Tried calling patient to let her know that the appointment on 5/5 at 11am can not be scheduled at that time because her AWV has to be 365 days out.

## 2019-11-12 DIAGNOSIS — D689 Coagulation defect, unspecified: Secondary | ICD-10-CM | POA: Diagnosis not present

## 2019-11-12 DIAGNOSIS — D509 Iron deficiency anemia, unspecified: Secondary | ICD-10-CM | POA: Diagnosis not present

## 2019-11-12 DIAGNOSIS — N186 End stage renal disease: Secondary | ICD-10-CM | POA: Diagnosis not present

## 2019-11-12 DIAGNOSIS — N2581 Secondary hyperparathyroidism of renal origin: Secondary | ICD-10-CM | POA: Diagnosis not present

## 2019-11-12 DIAGNOSIS — Z992 Dependence on renal dialysis: Secondary | ICD-10-CM | POA: Diagnosis not present

## 2019-11-12 DIAGNOSIS — D631 Anemia in chronic kidney disease: Secondary | ICD-10-CM | POA: Diagnosis not present

## 2019-11-15 DIAGNOSIS — D509 Iron deficiency anemia, unspecified: Secondary | ICD-10-CM | POA: Diagnosis not present

## 2019-11-15 DIAGNOSIS — N2581 Secondary hyperparathyroidism of renal origin: Secondary | ICD-10-CM | POA: Diagnosis not present

## 2019-11-15 DIAGNOSIS — D631 Anemia in chronic kidney disease: Secondary | ICD-10-CM | POA: Diagnosis not present

## 2019-11-15 DIAGNOSIS — D689 Coagulation defect, unspecified: Secondary | ICD-10-CM | POA: Diagnosis not present

## 2019-11-15 DIAGNOSIS — N186 End stage renal disease: Secondary | ICD-10-CM | POA: Diagnosis not present

## 2019-11-15 DIAGNOSIS — Z992 Dependence on renal dialysis: Secondary | ICD-10-CM | POA: Diagnosis not present

## 2019-11-17 DIAGNOSIS — L89152 Pressure ulcer of sacral region, stage 2: Secondary | ICD-10-CM | POA: Diagnosis not present

## 2019-11-17 DIAGNOSIS — T8189XD Other complications of procedures, not elsewhere classified, subsequent encounter: Secondary | ICD-10-CM | POA: Diagnosis not present

## 2019-11-17 DIAGNOSIS — D631 Anemia in chronic kidney disease: Secondary | ICD-10-CM | POA: Diagnosis not present

## 2019-11-17 DIAGNOSIS — D689 Coagulation defect, unspecified: Secondary | ICD-10-CM | POA: Diagnosis not present

## 2019-11-17 DIAGNOSIS — L89322 Pressure ulcer of left buttock, stage 2: Secondary | ICD-10-CM | POA: Diagnosis not present

## 2019-11-17 DIAGNOSIS — Z992 Dependence on renal dialysis: Secondary | ICD-10-CM | POA: Diagnosis not present

## 2019-11-17 DIAGNOSIS — D509 Iron deficiency anemia, unspecified: Secondary | ICD-10-CM | POA: Diagnosis not present

## 2019-11-17 DIAGNOSIS — N186 End stage renal disease: Secondary | ICD-10-CM | POA: Diagnosis not present

## 2019-11-17 DIAGNOSIS — N2581 Secondary hyperparathyroidism of renal origin: Secondary | ICD-10-CM | POA: Diagnosis not present

## 2019-11-18 DIAGNOSIS — M6281 Muscle weakness (generalized): Secondary | ICD-10-CM | POA: Diagnosis not present

## 2019-11-19 ENCOUNTER — Other Ambulatory Visit: Payer: Self-pay

## 2019-11-19 ENCOUNTER — Emergency Department: Payer: Medicare Other

## 2019-11-19 ENCOUNTER — Inpatient Hospital Stay
Admission: EM | Admit: 2019-11-19 | Discharge: 2019-11-23 | DRG: 862 | Disposition: A | Discharge status: Skilled Nursing Facility | Payer: Medicare Other | Source: Ambulatory Visit | Attending: Internal Medicine | Admitting: Internal Medicine

## 2019-11-19 DIAGNOSIS — R001 Bradycardia, unspecified: Secondary | ICD-10-CM | POA: Diagnosis not present

## 2019-11-19 DIAGNOSIS — Z888 Allergy status to other drugs, medicaments and biological substances status: Secondary | ICD-10-CM

## 2019-11-19 DIAGNOSIS — D631 Anemia in chronic kidney disease: Secondary | ICD-10-CM | POA: Diagnosis present

## 2019-11-19 DIAGNOSIS — G9341 Metabolic encephalopathy: Secondary | ICD-10-CM | POA: Diagnosis not present

## 2019-11-19 DIAGNOSIS — E1122 Type 2 diabetes mellitus with diabetic chronic kidney disease: Secondary | ICD-10-CM | POA: Diagnosis not present

## 2019-11-19 DIAGNOSIS — T148XXA Other injury of unspecified body region, initial encounter: Secondary | ICD-10-CM | POA: Diagnosis present

## 2019-11-19 DIAGNOSIS — Z8249 Family history of ischemic heart disease and other diseases of the circulatory system: Secondary | ICD-10-CM

## 2019-11-19 DIAGNOSIS — Z832 Family history of diseases of the blood and blood-forming organs and certain disorders involving the immune mechanism: Secondary | ICD-10-CM

## 2019-11-19 DIAGNOSIS — I517 Cardiomegaly: Secondary | ICD-10-CM | POA: Diagnosis not present

## 2019-11-19 DIAGNOSIS — E039 Hypothyroidism, unspecified: Secondary | ICD-10-CM | POA: Diagnosis not present

## 2019-11-19 DIAGNOSIS — Z743 Need for continuous supervision: Secondary | ICD-10-CM | POA: Diagnosis not present

## 2019-11-19 DIAGNOSIS — Y838 Other surgical procedures as the cause of abnormal reaction of the patient, or of later complication, without mention of misadventure at the time of the procedure: Secondary | ICD-10-CM | POA: Diagnosis present

## 2019-11-19 DIAGNOSIS — L89626 Pressure-induced deep tissue damage of left heel: Secondary | ICD-10-CM | POA: Diagnosis not present

## 2019-11-19 DIAGNOSIS — I700 Atherosclerosis of aorta: Secondary | ICD-10-CM | POA: Diagnosis not present

## 2019-11-19 DIAGNOSIS — I120 Hypertensive chronic kidney disease with stage 5 chronic kidney disease or end stage renal disease: Secondary | ICD-10-CM | POA: Diagnosis present

## 2019-11-19 DIAGNOSIS — Z841 Family history of disorders of kidney and ureter: Secondary | ICD-10-CM

## 2019-11-19 DIAGNOSIS — Z91013 Allergy to seafood: Secondary | ICD-10-CM

## 2019-11-19 DIAGNOSIS — I959 Hypotension, unspecified: Secondary | ICD-10-CM | POA: Diagnosis not present

## 2019-11-19 DIAGNOSIS — N2581 Secondary hyperparathyroidism of renal origin: Secondary | ICD-10-CM | POA: Diagnosis not present

## 2019-11-19 DIAGNOSIS — Z79891 Long term (current) use of opiate analgesic: Secondary | ICD-10-CM

## 2019-11-19 DIAGNOSIS — Z91011 Allergy to milk products: Secondary | ICD-10-CM

## 2019-11-19 DIAGNOSIS — K760 Fatty (change of) liver, not elsewhere classified: Secondary | ICD-10-CM | POA: Diagnosis not present

## 2019-11-19 DIAGNOSIS — M2550 Pain in unspecified joint: Secondary | ICD-10-CM | POA: Diagnosis not present

## 2019-11-19 DIAGNOSIS — D72829 Elevated white blood cell count, unspecified: Secondary | ICD-10-CM | POA: Diagnosis present

## 2019-11-19 DIAGNOSIS — Z992 Dependence on renal dialysis: Secondary | ICD-10-CM | POA: Diagnosis not present

## 2019-11-19 DIAGNOSIS — F0390 Unspecified dementia without behavioral disturbance: Secondary | ICD-10-CM | POA: Diagnosis present

## 2019-11-19 DIAGNOSIS — E669 Obesity, unspecified: Secondary | ICD-10-CM | POA: Diagnosis present

## 2019-11-19 DIAGNOSIS — E876 Hypokalemia: Secondary | ICD-10-CM | POA: Diagnosis not present

## 2019-11-19 DIAGNOSIS — Z8616 Personal history of COVID-19: Secondary | ICD-10-CM | POA: Diagnosis not present

## 2019-11-19 DIAGNOSIS — Z6826 Body mass index (BMI) 26.0-26.9, adult: Secondary | ICD-10-CM

## 2019-11-19 DIAGNOSIS — Z90710 Acquired absence of both cervix and uterus: Secondary | ICD-10-CM

## 2019-11-19 DIAGNOSIS — B964 Proteus (mirabilis) (morganii) as the cause of diseases classified elsewhere: Secondary | ICD-10-CM | POA: Diagnosis not present

## 2019-11-19 DIAGNOSIS — Z981 Arthrodesis status: Secondary | ICD-10-CM | POA: Diagnosis not present

## 2019-11-19 DIAGNOSIS — Z833 Family history of diabetes mellitus: Secondary | ICD-10-CM

## 2019-11-19 DIAGNOSIS — G92 Toxic encephalopathy: Secondary | ICD-10-CM | POA: Diagnosis present

## 2019-11-19 DIAGNOSIS — Z20822 Contact with and (suspected) exposure to covid-19: Secondary | ICD-10-CM | POA: Diagnosis present

## 2019-11-19 DIAGNOSIS — R509 Fever, unspecified: Secondary | ICD-10-CM | POA: Diagnosis not present

## 2019-11-19 DIAGNOSIS — L89150 Pressure ulcer of sacral region, unstageable: Secondary | ICD-10-CM | POA: Diagnosis not present

## 2019-11-19 DIAGNOSIS — D72825 Bandemia: Secondary | ICD-10-CM | POA: Diagnosis not present

## 2019-11-19 DIAGNOSIS — Z7401 Bed confinement status: Secondary | ICD-10-CM | POA: Diagnosis not present

## 2019-11-19 DIAGNOSIS — Z9049 Acquired absence of other specified parts of digestive tract: Secondary | ICD-10-CM

## 2019-11-19 DIAGNOSIS — T8149XA Infection following a procedure, other surgical site, initial encounter: Secondary | ICD-10-CM | POA: Diagnosis not present

## 2019-11-19 DIAGNOSIS — N186 End stage renal disease: Secondary | ICD-10-CM | POA: Diagnosis present

## 2019-11-19 DIAGNOSIS — Z79890 Hormone replacement therapy (postmenopausal): Secondary | ICD-10-CM

## 2019-11-19 DIAGNOSIS — R404 Transient alteration of awareness: Secondary | ICD-10-CM | POA: Diagnosis not present

## 2019-11-19 DIAGNOSIS — I10 Essential (primary) hypertension: Secondary | ICD-10-CM | POA: Diagnosis not present

## 2019-11-19 DIAGNOSIS — B9620 Unspecified Escherichia coli [E. coli] as the cause of diseases classified elsewhere: Secondary | ICD-10-CM | POA: Diagnosis present

## 2019-11-19 DIAGNOSIS — Z79899 Other long term (current) drug therapy: Secondary | ICD-10-CM

## 2019-11-19 DIAGNOSIS — L089 Local infection of the skin and subcutaneous tissue, unspecified: Secondary | ICD-10-CM | POA: Diagnosis not present

## 2019-11-19 LAB — COMPREHENSIVE METABOLIC PANEL
ALT: 10 U/L (ref 0–44)
AST: 15 U/L (ref 15–41)
Albumin: 1.5 g/dL — ABNORMAL LOW (ref 3.5–5.0)
Alkaline Phosphatase: 87 U/L (ref 38–126)
Anion gap: 7 (ref 5–15)
BUN: 23 mg/dL (ref 8–23)
CO2: 26 mmol/L (ref 22–32)
Calcium: 8.4 mg/dL — ABNORMAL LOW (ref 8.9–10.3)
Chloride: 99 mmol/L (ref 98–111)
Creatinine, Ser: 3.1 mg/dL — ABNORMAL HIGH (ref 0.44–1.00)
GFR calc Af Amer: 16 mL/min — ABNORMAL LOW (ref >60)
GFR calc non Af Amer: 14 mL/min — ABNORMAL LOW (ref >60)
Glucose, Bld: 100 mg/dL — ABNORMAL HIGH (ref 70–99)
Potassium: 3.4 mmol/L — ABNORMAL LOW (ref 3.5–5.1)
Sodium: 132 mmol/L — ABNORMAL LOW (ref 135–145)
Total Bilirubin: 0.5 mg/dL (ref 0.3–1.2)
Total Protein: 5.8 g/dL — ABNORMAL LOW (ref 6.5–8.1)

## 2019-11-19 LAB — PHOSPHORUS: Phosphorus: <1 mg/dL — CL (ref 2.5–4.6)

## 2019-11-19 LAB — CBC
HCT: 28.7 % — ABNORMAL LOW (ref 36.0–46.0)
Hemoglobin: 8.6 g/dL — ABNORMAL LOW (ref 12.0–15.0)
MCH: 27.1 pg (ref 26.0–34.0)
MCHC: 30 g/dL (ref 30.0–36.0)
MCV: 90.5 fL (ref 80.0–100.0)
Platelets: 141 10*3/uL — ABNORMAL LOW (ref 150–400)
RBC: 3.17 MIL/uL — ABNORMAL LOW (ref 3.87–5.11)
RDW: 17.4 % — ABNORMAL HIGH (ref 11.5–15.5)
WBC: 21 10*3/uL — ABNORMAL HIGH (ref 4.0–10.5)
nRBC: 0 % (ref 0.0–0.2)

## 2019-11-19 LAB — LACTIC ACID, PLASMA: Lactic Acid, Venous: 1.4 mmol/L (ref 0.5–1.9)

## 2019-11-19 LAB — GLUCOSE, CAPILLARY: Glucose-Capillary: 75 mg/dL (ref 70–99)

## 2019-11-19 MED ORDER — INSULIN ASPART 100 UNIT/ML ~~LOC~~ SOLN
4.0000 [IU] | Freq: Three times a day (TID) | SUBCUTANEOUS | Status: DC
  Administered 2019-11-22: 17:00:00 4 [IU] via SUBCUTANEOUS
  Filled 2019-11-19 (×2): qty 1

## 2019-11-19 MED ORDER — VANCOMYCIN HCL 750 MG/150ML IV SOLN: 750.0000 mg | INTRAVENOUS | Status: DC

## 2019-11-19 MED ORDER — NEPRO/CARBSTEADY PO LIQD
237.0000 mL | ORAL | Status: DC
  Administered 2019-11-19 – 2019-11-23 (×5): 237 mL via ORAL

## 2019-11-19 MED ORDER — BENZONATATE 100 MG PO CAPS: 100.0000 mg | ORAL_CAPSULE | Freq: Three times a day (TID) | ORAL | Status: DC | PRN

## 2019-11-19 MED ORDER — DONEPEZIL HCL 5 MG PO TABS
5.0000 mg | ORAL_TABLET | Freq: Every day | ORAL | Status: DC
  Administered 2019-11-19 – 2019-11-22 (×4): 5 mg via ORAL
  Filled 2019-11-19 (×5): qty 1

## 2019-11-19 MED ORDER — ACETAMINOPHEN 500 MG PO TABS: 500.0000 mg | ORAL_TABLET | Freq: Every day | ORAL | Status: DC | PRN

## 2019-11-19 MED ORDER — FOLIC ACID 1 MG PO TABS
500.0000 ug | ORAL_TABLET | Freq: Every day | ORAL | Status: DC
  Administered 2019-11-19 – 2019-11-23 (×5): 0.5 mg via ORAL
  Filled 2019-11-19 (×4): qty 1
  Filled 2019-11-19: qty 0.5
  Filled 2019-11-19 (×2): qty 1
  Filled 2019-11-19: qty 0.5

## 2019-11-19 MED ORDER — MEMANTINE HCL 5 MG PO TABS
5.0000 mg | ORAL_TABLET | Freq: Two times a day (BID) | ORAL | Status: DC
  Administered 2019-11-19 – 2019-11-23 (×8): 5 mg via ORAL
  Filled 2019-11-19 (×8): qty 1

## 2019-11-19 MED ORDER — CITALOPRAM HYDROBROMIDE 20 MG PO TABS
10.0000 mg | ORAL_TABLET | Freq: Every day | ORAL | Status: DC
  Administered 2019-11-19 – 2019-11-23 (×5): 10 mg via ORAL
  Filled 2019-11-19 (×5): qty 1

## 2019-11-19 MED ORDER — MECLIZINE HCL 12.5 MG PO TABS
12.5000 mg | ORAL_TABLET | Freq: Every day | ORAL | Status: DC | PRN
  Filled 2019-11-19: qty 1

## 2019-11-19 MED ORDER — ATORVASTATIN CALCIUM 20 MG PO TABS
40.0000 mg | ORAL_TABLET | Freq: Every day | ORAL | Status: DC
  Administered 2019-11-19 – 2019-11-22 (×4): 40 mg via ORAL
  Filled 2019-11-19 (×4): qty 2

## 2019-11-19 MED ORDER — SEVELAMER CARBONATE 800 MG PO TABS
800.0000 mg | ORAL_TABLET | Freq: Three times a day (TID) | ORAL | Status: DC
  Administered 2019-11-19 – 2019-11-20 (×3): 800 mg via ORAL
  Filled 2019-11-19 (×3): qty 1

## 2019-11-19 MED ORDER — CHLORHEXIDINE GLUCONATE CLOTH 2 % EX PADS
6.0000 | MEDICATED_PAD | Freq: Every day | CUTANEOUS | Status: DC
  Administered 2019-11-21 – 2019-11-23 (×2): 6 via TOPICAL

## 2019-11-19 MED ORDER — VANCOMYCIN HCL 1750 MG/350ML IV SOLN
1750.0000 mg | Freq: Once | INTRAVENOUS | Status: DC
  Filled 2019-11-19: qty 350

## 2019-11-19 MED ORDER — HEPARIN SODIUM (PORCINE) 5000 UNIT/ML IJ SOLN
5000.0000 [IU] | Freq: Three times a day (TID) | INTRAMUSCULAR | Status: DC
  Administered 2019-11-19 – 2019-11-23 (×12): 5000 [IU] via SUBCUTANEOUS
  Filled 2019-11-19 (×12): qty 1

## 2019-11-19 MED ORDER — ONDANSETRON HCL 4 MG PO TABS: 4.0000 mg | ORAL_TABLET | Freq: Four times a day (QID) | ORAL | Status: DC | PRN

## 2019-11-19 MED ORDER — LABETALOL HCL 100 MG PO TABS
100.0000 mg | ORAL_TABLET | Freq: Two times a day (BID) | ORAL | Status: DC
  Administered 2019-11-19 – 2019-11-23 (×7): 100 mg via ORAL
  Filled 2019-11-19 (×9): qty 1

## 2019-11-19 MED ORDER — SODIUM CHLORIDE 0.9% FLUSH: 3.0000 mL | INTRAVENOUS | Status: DC | PRN

## 2019-11-19 MED ORDER — VANCOMYCIN HCL 750 MG/150ML IV SOLN
750.0000 mg | INTRAVENOUS | Status: DC
  Administered 2019-11-19: 750 mg via INTRAVENOUS
  Filled 2019-11-19 (×2): qty 150

## 2019-11-19 MED ORDER — LACTULOSE 10 GM/15ML PO SOLN
10.0000 g | Freq: Every day | ORAL | Status: DC
  Administered 2019-11-19 – 2019-11-23 (×4): 10 g via ORAL
  Filled 2019-11-19 (×4): qty 30

## 2019-11-19 MED ORDER — PENTAFLUOROPROP-TETRAFLUOROETH EX AERO
1.0000 "application " | INHALATION_SPRAY | CUTANEOUS | Status: DC | PRN
  Filled 2019-11-19: qty 30

## 2019-11-19 MED ORDER — GABAPENTIN 100 MG PO CAPS
200.0000 mg | ORAL_CAPSULE | Freq: Every day | ORAL | Status: DC
  Administered 2019-11-19 – 2019-11-22 (×4): 200 mg via ORAL
  Filled 2019-11-19 (×4): qty 2

## 2019-11-19 MED ORDER — EPOETIN ALFA 4000 UNIT/ML IJ SOLN
4000.0000 [IU] | INTRAMUSCULAR | Status: DC
  Administered 2019-11-22: 4000 [IU] via SUBCUTANEOUS
  Filled 2019-11-19 (×2): qty 1

## 2019-11-19 MED ORDER — INSULIN ASPART 100 UNIT/ML ~~LOC~~ SOLN
0.0000 [IU] | Freq: Three times a day (TID) | SUBCUTANEOUS | Status: DC
  Administered 2019-11-21: 2 [IU] via SUBCUTANEOUS
  Filled 2019-11-19: qty 1

## 2019-11-19 MED ORDER — DIVALPROEX SODIUM 125 MG PO CSDR
125.0000 mg | DELAYED_RELEASE_CAPSULE | Freq: Two times a day (BID) | ORAL | Status: DC
  Administered 2019-11-19 – 2019-11-23 (×8): 125 mg via ORAL
  Filled 2019-11-19 (×9): qty 1

## 2019-11-19 MED ORDER — LIDOCAINE HCL (PF) 1 % IJ SOLN
5.0000 mL | INTRAMUSCULAR | Status: DC | PRN
  Filled 2019-11-19: qty 5

## 2019-11-19 MED ORDER — ONDANSETRON HCL 4 MG/2ML IJ SOLN: 4.0000 mg | Freq: Four times a day (QID) | INTRAMUSCULAR | Status: DC | PRN

## 2019-11-19 MED ORDER — ALTEPLASE 2 MG IJ SOLR: 2.0000 mg | Freq: Once | INTRAMUSCULAR | Status: DC | PRN

## 2019-11-19 MED ORDER — LEVOTHYROXINE SODIUM 100 MCG PO TABS
100.0000 ug | ORAL_TABLET | Freq: Every day | ORAL | Status: DC
  Administered 2019-11-20 – 2019-11-23 (×4): 100 ug via ORAL
  Filled 2019-11-19 (×5): qty 1

## 2019-11-19 MED ORDER — SODIUM CHLORIDE 0.9 % IV SOLN: 100.0000 mL | INTRAVENOUS | Status: DC | PRN

## 2019-11-19 MED ORDER — LIDOCAINE-PRILOCAINE 2.5-2.5 % EX CREA
1.0000 "application " | TOPICAL_CREAM | CUTANEOUS | Status: DC | PRN
  Filled 2019-11-19: qty 5

## 2019-11-19 MED ORDER — SODIUM CHLORIDE 0.9% FLUSH
3.0000 mL | Freq: Two times a day (BID) | INTRAVENOUS | Status: DC
  Administered 2019-11-19 – 2019-11-23 (×7): 3 mL via INTRAVENOUS

## 2019-11-19 MED ORDER — SODIUM CHLORIDE 0.9 % IV SOLN
250.0000 mL | INTRAVENOUS | Status: DC | PRN
  Administered 2019-11-20: 250 mL via INTRAVENOUS

## 2019-11-19 MED ORDER — HEPARIN SODIUM (PORCINE) 1000 UNIT/ML DIALYSIS: 1000.0000 [IU] | INTRAMUSCULAR | Status: DC | PRN

## 2019-11-19 MED ORDER — DOCUSATE SODIUM 100 MG PO CAPS
100.0000 mg | ORAL_CAPSULE | Freq: Two times a day (BID) | ORAL | Status: DC
  Administered 2019-11-19 – 2019-11-23 (×6): 100 mg via ORAL
  Filled 2019-11-19 (×6): qty 1

## 2019-11-19 NOTE — ED Notes (Signed)
Report given to Jack, RN

## 2019-11-19 NOTE — ED Notes (Signed)
Lab states green top hemolysed- explained to lab that they would need to come draw recollect d/t pt already being stuck twice for labs

## 2019-11-19 NOTE — Progress Notes (Signed)
   11/19/19 2230  Hand-Off documentation  Handoff Given Given to shift RN/LPN  Report given to (Full Name) Crystal BAss  Handoff Received Received from shift RN/LPN  Report received from (Full Name) Sherren Mocha  Vital Signs  Temp (!) 97.2 F (36.2 C)  Temp Source Oral  Pulse Rate 67  Pulse Rate Source Monitor  Resp 16  BP (!) 104/49  Oxygen Therapy  SpO2 100 %  Pain Assessment  Pain Scale 0-10  Pain Score 0  During Hemodialysis Assessment  Blood Flow Rate (mL/min) 400 mL/min  Arterial Pressure (mmHg) -200 mmHg  Venous Pressure (mmHg) 140 mmHg  Transmembrane Pressure (mmHg) 50 mmHg  Ultrafiltration Rate (mL/min) 600 mL/min  Dialysate Flow Rate (mL/min) 800 ml/min  Conductivity: Machine  13.8  HD Safety Checks Performed Yes  Intra-Hemodialysis Comments Tolerated well;Tx completed  Post-Hemodialysis Assessment  Rinseback Volume (mL) 250 mL  KECN 66.4 V  Dialyzer Clearance Lightly streaked  Duration of HD Treatment -hour(s) 3 hour(s)  Hemodialysis Intake (mL) 500 mL  UF Total -Machine (mL) 1500 mL  Net UF (mL) 1000 mL  Tolerated HD Treatment Yes  Education / Care Plan  Dialysis Education Provided Yes  Hemodialysis Catheter Right Internal jugular Double lumen Permanent (Tunneled)  Placement Date/Time: 06/08/19 1400   Placed prior to admission: No  Time Out: Correct patient;Correct procedure;Correct site  Maximum sterile barrier precautions: Hand hygiene;Cap  Site Prep: Chlorhexidine (preferred)  Local Anesthetic: (c)   Ultrasou...  Site Condition No complications  Blue Lumen Status Flushed;Blood return noted  Red Lumen Status Flushed;Blood return noted  Catheter fill solution Heparin 1000 units/ml  Catheter fill volume (Arterial) 1.4 cc  Catheter fill volume (Venous) 1.4  Dressing Type Biopatch;Occlusive  Dressing Status Clean;Dry;Intact  Interventions Dressing changed  Drainage Description None  Post treatment catheter status Capped and Clamped  tolerated well no  c/o 1L removed

## 2019-11-19 NOTE — ED Triage Notes (Signed)
Pt arrives via EMS from [facility]- per the staff there pt was "nodding off"- pt did not have her dialysis- VSS per EMS- pt from [facility]

## 2019-11-19 NOTE — Progress Notes (Signed)
Received critical lab value of phosphorus<1, patient in dialysis, informed Rufina Falco, NP.

## 2019-11-19 NOTE — Progress Notes (Addendum)
Pharmacy Antibiotic Note  Taylor Tyler is a 79 y.o. female admitted on 11/19/2019 with wound infection.  Pharmacy has been consulted for Vancomycin dosing.  Pt is on HD every T-Th-Sat.   Plan: Vancomycin 1750 mg IV X 1 ordered for 4/17.  Vancomycin 750 mg IV Q T-Th-Sat with HD ordered.  Will draw 1st Vanc trough before 3rd HD session on 4/24 @ ~ 1200.  Nephrology has been consulted for renal replacement;  Will need to F/U future plans for HD and verify current Vanc dosing schedule is correct.   Height: 5\' 6"  (167.6 cm) Weight: 75 kg (165 lb 5.5 oz) IBW/kg (Calculated) : 59.3  Temp (24hrs), Avg:98 F (36.7 C), Min:97.4 F (36.3 C), Max:98.6 F (37 C)  Recent Labs  Lab 11/19/19 1111 11/19/19 1221  WBC 21.0*  --   CREATININE  --  3.10*  LATICACIDVEN  --  1.4    Estimated Creatinine Clearance: 16 mL/min (A) (by C-G formula based on SCr of 3.1 mg/dL (H)).    Allergies  Allergen Reactions  . Clams [Shellfish Allergy] Swelling and Other (See Comments)    THROAT SWELLS NECK TURNS RED  . Hydralazine Other (See Comments)    CHEST TIGHTNESS Patient has tolerated multiple doses of hydralazine since allergy was listed   . Norvasc [Amlodipine Besylate] Swelling    Leg edema   . Milk-Related Compounds Diarrhea    Antimicrobials this admission:   >>    >>   Dose adjustments this admission:   Microbiology results:  BCx:   UCx:    Sputum:    MRSA PCR:   Thank you for allowing pharmacy to be a part of this patient's care.  Taylor Tyler 11/19/2019 6:57 PM

## 2019-11-19 NOTE — ED Provider Notes (Signed)
[facility] Emergency Department Provider Note   ____________________________________________    I have reviewed the triage vital signs and the nursing notes.   HISTORY  Chief Complaint Altered Mental Status     HPI Taylor Tyler is a 79 y.o. female with a history of end-stage renal disease, diabetes who presents with altered mental status.  Patient was dropped off for dialysis today but when dialysis nurse went to assess her and the patient was nearly unresponsive.  Upon arrival patient is unable to provide any history.  She is arousable and is able to move all of her extremities.  She states she feels "weak all over ".  Review of medical records demonstrates the patient was recently evaluated in the hospital for sepsis, continued to have very high white blood cell count, recommended repeat blood cultures, unclear what happened as an outpatient.  She does have a dialysis catheter in her chest.   Past Medical History:  Diagnosis Date  . Acute blood loss anemia 03/14/2017  . Acute renal failure superimposed on stage 3 chronic kidney disease ([place]) 03/14/2017  . AKI (acute kidney injury) ([place]) 02/28/2017  . Altered mental status 03/29/2017  . Anemia   . Aortic atherosclerosis ([place])   . Arthritis    "joints might ache at times; not that bad" (06/10/2018)  . Bilateral lower extremity edema 03/04/2017  . Bradycardia   . Chronic kidney disease    ?? renal insufficiency,   . CKD (chronic kidney disease) stage 3, GFR 30-59 ml/min 01/15/2017   ?? renal insufficiency, which she thinks is coming from "all these medications"  . COVID-19    10/17/19  . Dementia ([place])   . Diabetes mellitus without complication ([place])    diagnosed 4-5 yrs ago, 06/21/18- "that was years ago"  . Disease of pancreas   . Diverticulitis    s/p perforation and partial colectomy 01/27/14 with 3 benign lymph nodes   . Diverticulosis 01/15/2017  . DJD (degenerative joint disease)   . DM  (diabetes mellitus), type 2 with renal complications ([place]) 6/73/4193  . Elevated ferritin level   . Fatty liver   . Hypertension   . Hypothyroidism    "had radiation" (06/10/2018)  . Kidney disease, chronic, stage V (GFR under 15 ml/min) ([place])    per notes from nursing home-she is on dialysis  . Kidney stone   . Lethargy 02/28/2017  . Obesity, Class III, BMI 40-49.9 (morbid obesity) ([place]) 02/27/2017  . Pleural lipoma   . Postoperative wound infection 04/02/2017  . Spinal stenosis of lumbar region 01/15/2017  . Status post lumbar surgery   . Wound healing, delayed    back    Patient Active Problem List   Diagnosis Date Noted  . Acute metabolic encephalopathy 79/09/4095  . Dementia without behavioral disturbance ([place]) 11/03/2019  . History of anemia due to chronic kidney disease 11/03/2019  . ESRD (end stage renal disease) ([place]) 07/22/2019  . Fever in adult   . Pressure injury of skin 06/08/2019  . Acute CHF (congestive heart failure) ([place]) 05/30/2019  . Infection and inflammatory reaction due to internal fixation device of spine, initial encounter ([place]) 06/05/2018  . Neuropathy 01/08/2018  . Fatty liver 01/05/2018  . Chronic pain of both knees 01/05/2018  . Ventral hernia 01/05/2018  . CKD (chronic kidney disease) stage 4, GFR 15-29 ml/min (HCC) 07/21/2017  . Vitamin D deficiency 07/21/2017  . Essential hypertension   . Postoperative wound infection 04/02/2017  . Anemia  03/14/2017  . Acute blood loss anemia 03/14/2017  . Acute renal failure superimposed on stage 3 chronic kidney disease ([place]) 03/14/2017  . Bilateral leg edema 03/04/2017  . Obesity, Class III, BMI 40-49.9 (morbid obesity) ([place]) 02/27/2017  . DM (diabetes mellitus), type 2 with renal complications ([place]) 40/97/3532  . Status post lumbar surgery 02/24/2017  . CKD (chronic kidney disease) stage 3, GFR 30-59 ml/min 01/15/2017  . Hypothyroidism 01/15/2017  . Diverticulosis 01/15/2017  . HLD (hyperlipidemia)  01/15/2017  . Spinal stenosis of lumbar region 01/15/2017  . Disease of pancreas 07/21/2012    Past Surgical History:  Procedure Laterality Date  . ABDOMINAL EXPOSURE N/A 02/24/2017   Procedure: ABDOMINAL EXPOSURE;  Surgeon: Angelia Mould, MD;  Location: [place];  Service: Vascular;  Laterality: N/A;  . ABDOMINAL HYSTERECTOMY  1987   no h/o abnormal paps   . ANTERIOR LAT LUMBAR FUSION N/A 02/24/2017   Procedure: Lumbar three- five Anterior lateral lumbar interbody fusion;  Surgeon: Ditty, Kevan Ny, MD;  Location: [place];  Service: Neurosurgery;  Laterality: N/A;  L3-5 Anterior lateral lumbar interbody fusion with removal of coflex at L3-4, L4-5  . ANTERIOR LUMBAR FUSION N/A 02/24/2017   Procedure: Stage 1: Lumbar five-Sacral one Anterior lumbar interbody fusion;  Surgeon: Ditty, Kevan Ny, MD;  Location: [place];  Service: Neurosurgery;  Laterality: N/A;  Stage 1: L5-S1 Anterior lumbar interbody fusion  . APPENDECTOMY    . APPLICATION OF ROBOTIC ASSISTANCE FOR SPINAL PROCEDURE N/A 02/26/2017   Procedure: APPLICATION OF ROBOTIC ASSISTANCE FOR SPINAL PROCEDURE;  Surgeon: Ditty, Kevan Ny, MD;  Location: [place];  Service: Neurosurgery;  Laterality: N/A;  . APPLICATION OF WOUND VAC N/A 03/30/2017   Procedure: APPLICATION OF WOUND VAC;  Surgeon: Ditty, Kevan Ny, MD;  Location: [place];  Service: Neurosurgery;  Laterality: N/A;  . APPLICATION OF WOUND VAC N/A 06/23/2019   Procedure: APPLICATION OF WOUND VAC;  Surgeon: Consuella Lose, MD;  Location: [place];  Service: Neurosurgery;  Laterality: N/A;  . AV FISTULA PLACEMENT Left 06/08/2019   Procedure: Arteriovenous (Av) Fistula Creation;  Surgeon: Rosetta Posner, MD;  Location: [name];  Service: Vascular;  Laterality: Left;  . BACK SURGERY  2013  . CATARACT EXTRACTION W/ INTRAOCULAR LENS IMPLANT Right   . CHOLECYSTECTOMY OPEN  1982  . COLON SURGERY  01/26/2014   desc.sigmoid colectomy and ventral hernia repair and splenic  flexure mobilization  . COLON SURGERY    . DILATION AND CURETTAGE OF UTERUS    . HERNIA REPAIR    . INSERTION OF DIALYSIS CATHETER Right 06/08/2019   Procedure: INSERTION OF DIALYSIS CATHETER RIGHT INTERNAL JUGULAR;  Surgeon: Rosetta Posner, MD;  Location: [place];  Service: Vascular;  Laterality: Right;  . LUMBAR LAMINECTOMY WITH SPINOUS PROCESS PLATE 2 LEVEL N/A 9/92/4268   Procedure: LUMBAR LAMINECTOMY/DECOMPRESSION MICRODISCECTOMY CoFlex;  Surgeon: Faythe Ghee, MD;  Location: [facility];  Service: Neurosurgery;  Laterality: N/A;  Lumbar three-four,Lumbar Four-Five Laminectomy with Coflex  . LUMBAR WOUND DEBRIDEMENT N/A 03/30/2017   Procedure: Lumbar wound exploration/debridement, placement of wound vac;  Surgeon: Ditty, Kevan Ny, MD;  Location: [place];  Service: Neurosurgery;  Laterality: N/A;  Lumbar wound exploration/debridement, placement of wound vac  . LUMBAR WOUND DEBRIDEMENT N/A 06/06/2018   Procedure: LUMBAR WOUND DEBRIDEMENT/EXPLORATION;  Surgeon: Consuella Lose, MD;  Location: [place];  Service: Neurosurgery;  Laterality: N/A;  . LUMBAR WOUND DEBRIDEMENT N/A 06/22/2018   Procedure: SIMPLE INCISION AND DRAINAGE OF WOUND, APPLICATION OF WOUND  VAC;  Surgeon: Consuella Lose, MD;  Location: [place];  Service: Neurosurgery;  Laterality: N/A;  . LUMBAR WOUND DEBRIDEMENT N/A 06/23/2019   Procedure: LUMBAR WOUND DEBRIDEMENT WITH HARDWARE REMOVAL;  Surgeon: Consuella Lose, MD;  Location: [place];  Service: Neurosurgery;  Laterality: N/A;  . PARTIAL COLECTOMY     01/27/14 diverticulitis and 3 benign lymph nodes [facility] Dr. Pat Patrick   . REDUCTION MAMMAPLASTY Bilateral 1992  . THROMBECTOMY BRACHIAL ARTERY Left 06/08/2019   Procedure: Thrombectomy Brachial Artery;  Surgeon: Rosetta Posner, MD;  Location: [place];  Service: Vascular;  Laterality: Left;  . TUBAL LIGATION    . VENTRAL HERNIA REPAIR  2014    Prior to Admission medications   Medication Sig Start Date End Date Taking? Authorizing  Provider  acetaminophen (TYLENOL) 500 MG tablet Take 500 mg by mouth daily as needed for mild pain.     [provider]  atorvastatin (LIPITOR) 40 MG tablet Take 1 tablet (40 mg total) by mouth daily at 6 PM. 09/03/18   McLean-Scocuzza, Nino Glow, MD  benzonatate (TESSALON PERLES) 100 MG capsule Take 1 capsule (100 mg total) by mouth 3 (three) times daily as needed for cough. 08/07/19 08/06/20  Earleen Newport, MD  citalopram (CELEXA) 10 MG tablet Take 10 mg by mouth daily.    [provider]  divalproex (DEPAKOTE SPRINKLE) 125 MG capsule Take 125 mg by mouth 2 (two) times daily.     [provider]  docusate sodium (COLACE) 100 MG capsule Take 100 mg by mouth 2 (two) times daily.    [provider]  donepezil (ARICEPT) 5 MG tablet Take 5 mg by mouth at bedtime.    [provider]  epoetin alfa (EPOGEN) 4000 UNIT/ML injection Inject 1 mL (4,000 Units total) into the skin Every Tuesday,Thursday,and Saturday with dialysis. 11/09/19   Enzo Bi, MD  folic acid (FOLVITE) 542 MCG tablet Take 400 mcg by mouth daily.    [provider]  gabapentin (NEURONTIN) 100 MG capsule Take 2 capsules (200 mg total) by mouth at bedtime. 07/04/19   Regalado, Belkys A, MD  labetalol (NORMODYNE) 100 MG tablet Take 1 tablet (100 mg total) by mouth 2 (two) times daily. 07/08/19   Kayleen Memos, DO  Lactobacillus (ACIDOPHILUS PO) Take 175 mg by mouth 2 (two) times daily.    [provider]  lactulose, encephalopathy, (GENERLAC) 10 GM/15ML SOLN Take 10 g by mouth daily.    [provider]  levothyroxine (SYNTHROID, LEVOTHROID) 100 MCG tablet Take 100 mcg by mouth daily before breakfast.    [provider]  LORazepam (ATIVAN PO) Apply 0.5 mg/mL topically 2 (two) times daily as needed. 1 ml to skin    [provider]  meclizine (ANTIVERT) 12.5 MG tablet Take 1 tablet (12.5 mg total) by mouth daily as needed for dizziness. 11/09/19   Enzo Bi,  MD  memantine (NAMENDA) 5 MG tablet Take 5 mg by mouth 2 (two) times daily.    [provider]  Nutritional Supplements (FEEDING SUPPLEMENT, NEPRO CARB STEADY,) LIQD Take 237 mLs by mouth daily.    [provider]  risperiDONE (RISPERDAL) 0.25 MG tablet Take 0.25 mg by mouth 2 (two) times daily.    [provider]  sevelamer carbonate (RENVELA) 800 MG tablet Take 1 tablet (800 mg total) by mouth 3 (three) times daily with meals. 07/04/19   Regalado, Belkys A, MD  traMADol (ULTRAM) 50 MG tablet Take 25 mg by mouth every  6 (six) hours as needed.     [provider]  zinc oxide 20 % ointment Apply 1 application topically in the morning, at noon, and at bedtime.    [provider]     Allergies Clams [shellfish allergy], Hydralazine, Norvasc [amlodipine besylate], and Milk-related compounds  Family History  Problem Relation Age of Onset  . Diabetes Father   . Heart attack Father   . Hypertension Father   . Heart disease Father   . Kidney disease Father   . Diabetes Mother   . Hypertension Mother   . Heart disease Mother   . Kidney disease Mother   . Lupus Sister   . Heart disease Brother   . Diabetes Brother   . Heart disease Brother   . Diabetes Brother   . Lupus Sister     Social History Social History   Tobacco Use  . Smoking status: Never Smoker  . Smokeless tobacco: Never Used  Substance Use Topics  . Alcohol use: Not Currently    Comment: "nothing since age 82" (06/10/2018)  . Drug use: Never    Review of Systems  Constitutional: No reports of fevers, feels weak all over Eyes: No visual changes.  ENT: No sore throat. Cardiovascular: Denies chest pain. Respiratory: Denies shortness of breath. Gastrointestinal: Denies abdominal pain Genitourinary: Negative for dysuria. Musculoskeletal: Negative for back pain. Skin: Negative for rash. Neurological: Negative for headaches or  weakness   ____________________________________________   PHYSICAL EXAM:  VITAL SIGNS: ED Triage Vitals  Enc Vitals Group     BP 11/19/19 1100 112/78     Pulse Rate 11/19/19 1100 66     Resp 11/19/19 1100 18     Temp 11/19/19 1100 98.6 F (37 C)     Temp Source 11/19/19 1100 Oral     SpO2 11/19/19 1054 97 %     Weight 11/19/19 1058 75 kg (165 lb 5.5 oz)     Height 11/19/19 1058 1.676 m (5\' 6" )     Head Circumference --      Peak Flow --      Pain Score 11/19/19 1058 0     Pain Loc --      Pain Edu? --      Excl. in [place]? --     Constitutional: Alert but appears very drowsy  Nose: No congestion/rhinnorhea. Mouth/Throat: Mucous membranes are moist.    Cardiovascular: Normal rate, regular rhythm.  Good peripheral circulation. Respiratory: Normal respiratory effort.  No retractions. Lungs CTAB. Gastrointestinal: Soft and nontender. No distention.  No CVA tenderness.  Musculoskeletal: No lower extremity tenderness nor edema.  Warm and well perfused Neurologic:  Normal speech and language. No gross focal neurologic deficits are appreciated.  Moves all extremities Skin:  Skin is warm, dry and intact. No rash noted.   ____________________________________________   LABS (all labs ordered are listed, but only abnormal results are displayed)  Labs Reviewed  CBC - Abnormal; Notable for the following components:      Result Value   WBC 21.0 (*)    RBC 3.17 (*)    Hemoglobin 8.6 (*)    HCT 28.7 (*)    RDW 17.4 (*)    Platelets 141 (*)    All other components within normal limits  COMPREHENSIVE METABOLIC PANEL - Abnormal; Notable for the following components:   Sodium 132 (*)    Potassium 3.4 (*)    Glucose, Bld 100 (*)    Creatinine, Ser 3.10 (*)  Calcium 8.4 (*)    Total Protein 5.8 (*)    Albumin 1.5 (*)    GFR calc non Af Amer 14 (*)    GFR calc Af Amer 16 (*)    All other components within normal limits  CULTURE, BLOOD (ROUTINE X 2)  CULTURE, BLOOD (ROUTINE  X 2)  LACTIC ACID, PLASMA   ____________________________________________  EKG  ED ECG REPORT I, Lavonia Drafts, the attending physician, personally viewed and interpreted this ECG.  Date: 11/19/2019  Rhythm: normal sinus rhythm QRS Axis: normal Intervals: normal ST/T Wave abnormalities: normal   ____________________________________________  RADIOLOGY  Chest x-ray unremarkable CT head unremarkable ____________________________________________   PROCEDURES  Procedure(s) performed: No  Procedures   Critical Care performed: No ____________________________________________   INITIAL IMPRESSION / ASSESSMENT AND PLAN / ED COURSE  Pertinent labs & imaging results that were available during my care of the patient were reviewed by me and considered in my medical decision making (see chart for details).  Patient presents with altered mental status, appears very drowsy and fatigued is arousable.  Does move all extremities.  Does not appear consistent with acute stroke.  She denies lightheadedness.  No fevers or chills reported.  Reviewed medical records recent work-up for sepsis.  Sent for CT head which is reassuring, chest x-ray unremarkable.  Notified of elevated white blood cell count of 21,000.  This is up from discharge white blood cell count.   Given her extreme fatigue/altered mental status and elevated white blood cell count.  Will resend lactic acid and blood cultures and will admit to the hospitalist service.  Doubt sepsis, patient does have a history of metabolic encephalopathy/altered mental status unrelated to infection and her presentation today seems more in keeping with that.  Lactic acid is reassuring.  Will hold on antibiotics at this time pending cultures  We will discuss with the hospitalist service    ____________________________________________   FINAL CLINICAL IMPRESSION(S) / ED DIAGNOSES  Final diagnoses:  Acute metabolic encephalopathy         Note:  This document was prepared using Dragon voice recognition software and may include unintentional dictation errors.   Lavonia Drafts, MD 11/19/19 1420

## 2019-11-19 NOTE — H&P (Addendum)
History and Physical    MEDORA ROORDA XIP:[id number] DOB: 10/07/1940 DOA: 11/19/2019  PCP: McLean-Scocuzza, Nino Glow, MD   Patient coming from: [place]  I have personally briefly reviewed patient's old medical records in [place]  Chief Complaint: Change in mental status  HPI: Taylor Tyler is a 79 y.o. female with medical history significant for end-stage renal disease on dialysis (T/TH/S), Covid + in December 2020,  repeat Covid test was negative on 11/03/19, history of dementia and diabetes mellitus who was sent to the emergency room for evaluation of mental status changes.  Patient was dropped off for her dialysis treatment today and when the dialysis nurse went to assess her, the patient was found unresponsive .She was sent to the emergency room for further evaluation .  In the ER patient was noted to be arousable to verbal stimuli and stated that she felt weak all over. During her last hospitalization she was noted to have leukocytosis and a fever and a full infectious work-up was done at that time to rule out line sepsis.  Blood cultures from permacath were negative.  She had a transthoracic echocardiogram which was negative for thrombus or vegetation.  There was no source of sepsis found but patient continued to have marked leukocytosis.  She was seen in consultation by ID who was going to follow follow-up as an outpatient for further evaluation.   ED Course:  Patient presents with altered mental status, appears very drowsy and fatigued but is arousable.  Does move all extremities.  Does not appear consistent with acute stroke.  She denies lightheadedness.  No fevers or chills reported.  Reviewed medical records recent work-up for sepsis. Patient had a CT head which is reassuring, chest x-ray unremarkable.  WBC is elevated at 21K and is up when compared to her white count on discharge Given her extreme fatigue/altered mental status and elevated white blood cell  count. Lactic acid is within normal limits and blood cultures have been sent Will hold on antibiotics at this time pending cultures  Review of Systems: As per HPI otherwise 10 point review of systems negative.    Past Medical History:  Diagnosis Date  . Acute blood loss anemia 03/14/2017  . Acute renal failure superimposed on stage 3 chronic kidney disease ([place]) 03/14/2017  . AKI (acute kidney injury) ([place]) 02/28/2017  . Altered mental status 03/29/2017  . Anemia   . Aortic atherosclerosis ([place])   . Arthritis    "joints might ache at times; not that bad" (06/10/2018)  . Bilateral lower extremity edema 03/04/2017  . Bradycardia   . Chronic kidney disease    ?? renal insufficiency,   . CKD (chronic kidney disease) stage 3, GFR 30-59 ml/min 01/15/2017   ?? renal insufficiency, which she thinks is coming from "all these medications"  . COVID-19    10/17/19  . Dementia ([place])   . Diabetes mellitus without complication ([place])    diagnosed 4-5 yrs ago, 06/21/18- "that was years ago"  . Disease of pancreas   . Diverticulitis    s/p perforation and partial colectomy 01/27/14 with 3 benign lymph nodes   . Diverticulosis 01/15/2017  . DJD (degenerative joint disease)   . DM (diabetes mellitus), type 2 with renal complications ([place]) 6/73/4193  . Elevated ferritin level   . Fatty liver   . Hypertension   . Hypothyroidism    "had radiation" (06/10/2018)  . Kidney disease, chronic, stage V (GFR under 15 ml/min) ([place])  per notes from nursing home-she is on dialysis  . Kidney stone   . Lethargy 02/28/2017  . Obesity, Class III, BMI 40-49.9 (morbid obesity) ([place]) 02/27/2017  . Pleural lipoma   . Postoperative wound infection 04/02/2017  . Spinal stenosis of lumbar region 01/15/2017  . Status post lumbar surgery   . Wound healing, delayed    back    Past Surgical History:  Procedure Laterality Date  . ABDOMINAL EXPOSURE N/A 02/24/2017   Procedure: ABDOMINAL EXPOSURE;  Surgeon: Angelia Mould, MD;  Location: [facility];  Service: Vascular;  Laterality: N/A;  . ABDOMINAL HYSTERECTOMY  1987   no h/o abnormal paps   . ANTERIOR LAT LUMBAR FUSION N/A 02/24/2017   Procedure: Lumbar three- five Anterior lateral lumbar interbody fusion;  Surgeon: Ditty, Kevan Ny, MD;  Location: [facility];  Service: Neurosurgery;  Laterality: N/A;  L3-5 Anterior lateral lumbar interbody fusion with removal of coflex at L3-4, L4-5  . ANTERIOR LUMBAR FUSION N/A 02/24/2017   Procedure: Stage 1: Lumbar five-Sacral one Anterior lumbar interbody fusion;  Surgeon: Ditty, Kevan Ny, MD;  Location: [facility];  Service: Neurosurgery;  Laterality: N/A;  Stage 1: L5-S1 Anterior lumbar interbody fusion  . APPENDECTOMY    . APPLICATION OF ROBOTIC ASSISTANCE FOR SPINAL PROCEDURE N/A 02/26/2017   Procedure: APPLICATION OF ROBOTIC ASSISTANCE FOR SPINAL PROCEDURE;  Surgeon: Ditty, Kevan Ny, MD;  Location: [facility];  Service: Neurosurgery;  Laterality: N/A;  . APPLICATION OF WOUND VAC N/A 03/30/2017   Procedure: APPLICATION OF WOUND VAC;  Surgeon: Ditty, Kevan Ny, MD;  Location: [facility];  Service: Neurosurgery;  Laterality: N/A;  . APPLICATION OF WOUND VAC N/A 06/23/2019   Procedure: APPLICATION OF WOUND VAC;  Surgeon: Consuella Lose, MD;  Location: [facility];  Service: Neurosurgery;  Laterality: N/A;  . AV FISTULA PLACEMENT Left 06/08/2019   Procedure: Arteriovenous (Av) Fistula Creation;  Surgeon: Rosetta Posner, MD;  Location: [facility];  Service: Vascular;  Laterality: Left;  . BACK SURGERY  2013  . CATARACT EXTRACTION W/ INTRAOCULAR LENS IMPLANT Right   . CHOLECYSTECTOMY OPEN  1982  . COLON SURGERY  01/26/2014   desc.sigmoid colectomy and ventral hernia repair and splenic flexure mobilization  . COLON SURGERY    . DILATION AND CURETTAGE OF UTERUS    . HERNIA REPAIR    . INSERTION OF DIALYSIS CATHETER Right 06/08/2019   Procedure: INSERTION OF DIALYSIS CATHETER RIGHT INTERNAL JUGULAR;  Surgeon: Rosetta Posner, MD;  Location:  [facility];  Service: Vascular;  Laterality: Right;  . LUMBAR LAMINECTOMY WITH SPINOUS PROCESS PLATE 2 LEVEL N/A 03/27/2352   Procedure: LUMBAR LAMINECTOMY/DECOMPRESSION MICRODISCECTOMY CoFlex;  Surgeon: Faythe Ghee, MD;  Location: [facility];  Service: Neurosurgery;  Laterality: N/A;  Lumbar three-four,Lumbar Four-Five Laminectomy with Coflex  . LUMBAR WOUND DEBRIDEMENT N/A 03/30/2017   Procedure: Lumbar wound exploration/debridement, placement of wound vac;  Surgeon: Ditty, Kevan Ny, MD;  Location: [facility];  Service: Neurosurgery;  Laterality: N/A;  Lumbar wound exploration/debridement, placement of wound vac  . LUMBAR WOUND DEBRIDEMENT N/A 06/06/2018   Procedure: LUMBAR WOUND DEBRIDEMENT/EXPLORATION;  Surgeon: Consuella Lose, MD;  Location: [facility];  Service: Neurosurgery;  Laterality: N/A;  . LUMBAR WOUND DEBRIDEMENT N/A 06/22/2018   Procedure: SIMPLE INCISION AND DRAINAGE OF WOUND, APPLICATION OF WOUND VAC;  Surgeon: Consuella Lose, MD;  Location: [facility];  Service: Neurosurgery;  Laterality: N/A;  . LUMBAR WOUND DEBRIDEMENT N/A 06/23/2019   Procedure: LUMBAR WOUND DEBRIDEMENT WITH HARDWARE REMOVAL;  Surgeon:  Consuella Lose, MD;  Location: [facility];  Service: Neurosurgery;  Laterality: N/A;  . PARTIAL COLECTOMY     01/27/14 diverticulitis and 3 benign lymph nodes [facility] Dr. Pat Patrick   . REDUCTION MAMMAPLASTY Bilateral 1992  . THROMBECTOMY BRACHIAL ARTERY Left 06/08/2019   Procedure: Thrombectomy Brachial Artery;  Surgeon: Rosetta Posner, MD;  Location: [facility];  Service: Vascular;  Laterality: Left;  . TUBAL LIGATION    . VENTRAL HERNIA REPAIR  2014     reports that she has never smoked. She has never used smokeless tobacco. She reports previous alcohol use. She reports that she does not use drugs.  Allergies  Allergen Reactions  . Clams [Shellfish Allergy] Swelling and Other (See Comments)    THROAT SWELLS NECK TURNS RED  . Hydralazine Other (See Comments)    CHEST TIGHTNESS Patient has  tolerated multiple doses of hydralazine since allergy was listed   . Norvasc [Amlodipine Besylate] Swelling    Leg edema   . Milk-Related Compounds Diarrhea    Family History  Problem Relation Age of Onset  . Diabetes Father   . Heart attack Father   . Hypertension Father   . Heart disease Father   . Kidney disease Father   . Diabetes Mother   . Hypertension Mother   . Heart disease Mother   . Kidney disease Mother   . Lupus Sister   . Heart disease Brother   . Diabetes Brother   . Heart disease Brother   . Diabetes Brother   . Lupus Sister      Prior to Admission medications   Medication Sig Start Date End Date Taking? Authorizing Provider  acetaminophen (TYLENOL) 500 MG tablet Take 500 mg by mouth daily as needed for mild pain.     [provider]  atorvastatin (LIPITOR) 40 MG tablet Take 1 tablet (40 mg total) by mouth daily at 6 PM. 09/03/18   McLean-Scocuzza, Nino Glow, MD  benzonatate (TESSALON PERLES) 100 MG capsule Take 1 capsule (100 mg total) by mouth 3 (three) times daily as needed for cough. 08/07/19 08/06/20  Earleen [place], MD  citalopram (CELEXA) 10 MG tablet Take 10 mg by mouth daily.    [provider]  divalproex (DEPAKOTE SPRINKLE) 125 MG capsule Take 125 mg by mouth 2 (two) times daily.     [provider]  docusate sodium (COLACE) 100 MG capsule Take 100 mg by mouth 2 (two) times daily.    [provider]  donepezil (ARICEPT) 5 MG tablet Take 5 mg by mouth at bedtime.    [provider]  epoetin alfa (EPOGEN) 4000 UNIT/ML injection Inject 1 mL (4,000 Units total) into the skin Every Tuesday,Thursday,and Saturday with dialysis. 11/09/19   Enzo Bi, MD  folic acid (FOLVITE) 416 MCG tablet Take 400 mcg by mouth daily.    [provider]  gabapentin (NEURONTIN) 100 MG capsule Take 2 capsules (200 mg total) by mouth at bedtime. 07/04/19   Regalado, Belkys A, MD  labetalol (NORMODYNE) 100 MG tablet Take 1 tablet  (100 mg total) by mouth 2 (two) times daily. 07/08/19   Kayleen Memos, DO  Lactobacillus (ACIDOPHILUS PO) Take 175 mg by mouth 2 (two) times daily.    [provider]  lactulose, encephalopathy, (GENERLAC) 10 GM/15ML SOLN Take 10 g by mouth daily.    [provider]  levothyroxine (SYNTHROID, LEVOTHROID) 100 MCG tablet Take 100 mcg by mouth daily before breakfast.    [provider]  LORazepam (ATIVAN PO) Apply 0.5 mg/mL topically 2 (two) times daily as needed. 1 ml to skin    [provider]  meclizine (ANTIVERT) 12.5 MG tablet Take 1 tablet (12.5 mg total) by mouth daily as needed for dizziness. 11/09/19   Enzo Bi, MD  memantine (NAMENDA) 5 MG tablet Take 5 mg by mouth 2 (two) times daily.    [provider]  Nutritional Supplements (FEEDING SUPPLEMENT, NEPRO CARB STEADY,) LIQD Take 237 mLs by mouth daily.    [provider]  risperiDONE (RISPERDAL) 0.25 MG tablet Take 0.25 mg by mouth 2 (two) times daily.    [provider]  sevelamer carbonate (RENVELA) 800 MG tablet Take 1 tablet (800 mg total) by mouth 3 (three) times daily with meals. 07/04/19   Regalado, Belkys A, MD  traMADol (ULTRAM) 50 MG tablet Take 25 mg by mouth every 6 (six) hours as needed.     [provider]  zinc oxide 20 % ointment Apply 1 application topically in the morning, at noon, and at bedtime.    [provider]    Physical Exam: Vitals:   11/19/19 1249 11/19/19 1250 11/19/19 1300 11/19/19 1330  BP: (!) 134/52  (!) 131/51 (!) 133/52  Pulse: 68 (!) 53  62  Resp:  15 15 14   Temp:      TempSrc:      SpO2: 98% 94%  96%  Weight:      Height:         Vitals:   11/19/19 1249 11/19/19 1250 11/19/19 1300 11/19/19 1330  BP: (!) 134/52  (!) 131/51 (!) 133/52  Pulse: 68 (!) 53  62  Resp:  15 15 14   Temp:      TempSrc:      SpO2: 98% 94%  96%  Weight:      Height:        Constitutional: NAD, lethargic but arouses to verbal  stimuli Eyes: PERRL, lids and conjunctivae normal ENMT: Mucous membranes are moist.  Neck: normal, supple, no masses, no thyromegaly Respiratory: clear to auscultation bilaterally, no wheezing, no crackles. Normal respiratory effort. No accessory muscle use.  Cardiovascular: Bradycardic, no murmurs / rubs / gallops. No extremity edema. 2+ pedal pulses. No carotid bruits.  Abdomen: no tenderness, no masses palpated. No hepatosplenomegaly. Bowel sounds positive.  Musculoskeletal: no clubbing / cyanosis. No joint deformity upper and lower extremities.  Skin: no rashes, lesions, non stageable ulcer on the back, open wound with drainage Neurologic: Able to move her extremities but weak Psychiatric: Normal mood and affect.   Labs on Admission: I have personally reviewed following labs and imaging studies  CBC: Recent Labs  Lab 11/19/19 1111  WBC 21.0*  HGB 8.6*  HCT 28.7*  MCV 90.5  PLT 938*   Basic Metabolic Panel: Recent Labs  Lab 11/19/19 1221  NA 132*  K 3.4*  CL 99  CO2 26  GLUCOSE 100*  BUN 23  CREATININE 3.10*  CALCIUM 8.4*   GFR: Estimated Creatinine Clearance: 16 mL/min (A) (by C-G formula based on SCr of 3.1 mg/dL (H)). Liver Function Tests: Recent Labs  Lab 11/19/19 1221  AST 15  ALT 10  ALKPHOS 87  BILITOT 0.5  PROT 5.8*  ALBUMIN 1.5*   No results for input(s): LIPASE, AMYLASE in the last 168 hours. No results for input(s): AMMONIA in the last 168 hours. Coagulation Profile: No results for input(s): INR, PROTIME in the last 168 hours. Cardiac Enzymes: No results for input(s):  CKTOTAL, CKMB, CKMBINDEX, TROPONINI in the last 168 hours. BNP (last 3 results) No results for input(s): PROBNP in the last 8760 hours. HbA1C: No results for input(s): HGBA1C in the last 72 hours. CBG: No results for input(s): GLUCAP in the last 168 hours. Lipid Profile: No results for input(s): CHOL, HDL, LDLCALC, TRIG, CHOLHDL, LDLDIRECT in the last 72 hours. Thyroid  Function Tests: No results for input(s): TSH, T4TOTAL, FREET4, T3FREE, THYROIDAB in the last 72 hours. Anemia Panel: No results for input(s): VITAMINB12, FOLATE, FERRITIN, TIBC, IRON, RETICCTPCT in the last 72 hours. Urine analysis:    Component Value Date/Time   COLORURINE AMBER (A) 06/15/2019 1800   APPEARANCEUR CLOUDY (A) 06/15/2019 1800   APPEARANCEUR Cloudy 12/14/2013 1727   LABSPEC 1.026 06/15/2019 1800   LABSPEC 1.012 12/14/2013 1727   PHURINE 5.0 06/15/2019 1800   GLUCOSEU 50 (A) 06/15/2019 1800   GLUCOSEU NEGATIVE 07/21/2017 1001   HGBUR NEGATIVE 06/15/2019 1800   BILIRUBINUR NEGATIVE 06/15/2019 1800   BILIRUBINUR Negative 12/14/2013 1727   KETONESUR NEGATIVE 06/15/2019 1800   PROTEINUR >=300 (A) 06/15/2019 1800   UROBILINOGEN 0.2 07/21/2017 1001   NITRITE NEGATIVE 06/15/2019 1800   LEUKOCYTESUR MODERATE (A) 06/15/2019 1800   LEUKOCYTESUR 3+ 12/14/2013 1727    Radiological Exams on Admission: CT Head Wo Contrast  Result Date: 11/19/2019 CLINICAL DATA:  Altered mental status. EXAM: CT HEAD WITHOUT CONTRAST TECHNIQUE: Contiguous axial images were obtained from the base of the skull through the vertex without intravenous contrast. COMPARISON:  03/29/2017 and brain MR dated 09/08/2019 FINDINGS: Brain: Stable mildly enlarged ventricles and cortical sulci. Stable mild patchy white matter low density in both cerebral hemispheres. No intracranial hemorrhage, mass lesion or CT evidence of acute infarction. Vascular: No hyperdense vessel or unexpected calcification. Skull: Normal. Negative for fracture or focal lesion. Sinuses/Orbits: Mild increase in posteriorly layering fluid/mucosal thickening in the sphenoid sinus on the left. Interval small amount of fluid in the left maxillary sinus. Status post cataract extraction on the right. Unremarkable left orbit. Other: None. IMPRESSION: 1. No acute abnormality. 2. Stable mild diffuse cerebral and cerebellar atrophy and mild chronic small  vessel white matter ischemic changes in both cerebral hemispheres. 3. Mild increase in the amount of fluid/mucosal thickening in the sphenoid sinus on the left. 4. Interval small amount of fluid in the left maxillary sinus, possibly due to acute sinusitis. Electronically Signed   By: Claudie Revering M.D.   On: 11/19/2019 12:17   DG Chest Port 1 View  Result Date: 11/19/2019 CLINICAL DATA:  Altered mental status, bradycardia EXAM: PORTABLE CHEST 1 VIEW COMPARISON:  11/03/2019 chest radiograph. FINDINGS: Right internal jugular central venous catheter terminates over high right atrium. Stable cardiomediastinal silhouette with mild cardiomegaly. No pneumothorax. No pleural effusion. No overt pulmonary edema. No acute consolidative airspace disease. IMPRESSION: Stable mild cardiomegaly without overt pulmonary edema. No active pulmonary disease. Electronically Signed   By: Ilona Sorrel M.D.   On: 11/19/2019 12:37    EKG: Independently reviewed.  Sinus rhythm  Assessment/Plan Principal Problem:   Acute metabolic encephalopathy Active Problems:   Hypothyroidism   Obesity, Class III, BMI 40-49.9 (morbid obesity) (HCC)   ESRD (end stage renal disease) (HCC)   Dementia without behavioral disturbance (HCC)   Leukocytosis     Acute metabolic encephalopathy Most likely secondary to wound infection Patient has a wound on the back with purulent drainage Patient had a CT scan of the head done without contrast which showed stable mild diffuse cerebral and cerebellar atrophy  and mild chronic small vessel white matter ischemic changes in both cerebral hemispheres. We will monitor closely during this hospitalization   End-stage renal disease on hemodialysis Dialysis days are Tuesday/Thursday/Saturday We will request nephrology consult for renal replacement therapy   Leukocytosis Most likely related to an infected wound on her back Obtain wound cultures Start patient empirically on IV Vancomycin pending  culture results Patient is afebrile.  Chest x-ray with no acute findings. Blood pressure is stable Patient had recent infectious work-up about 2 weeks ago We will blood cultures from [facility] cath during dialysis We will request ID consult   Anemia of chronic kidney disease  Stable  DVT prophylaxis: Heparin Code Status: Full code Family Communication: Called and left a voicemail for patient's son, Kalle Bernath, awaiting callback Disposition Plan: Back to previous home environment Consults called: Nephrology    Collier Bullock MD [facility]     11/19/2019, 3:13 PM

## 2019-11-19 NOTE — Progress Notes (Signed)
Patient taken to Dialysis by transporter

## 2019-11-19 NOTE — ED Notes (Signed)
Pt taken to CT.

## 2019-11-20 ENCOUNTER — Encounter: Payer: Self-pay | Admitting: Internal Medicine

## 2019-11-20 DIAGNOSIS — D72825 Bandemia: Secondary | ICD-10-CM

## 2019-11-20 LAB — BASIC METABOLIC PANEL
Anion gap: 6 (ref 5–15)
BUN: 11 mg/dL (ref 8–23)
CO2: 30 mmol/L (ref 22–32)
Calcium: 7.9 mg/dL — ABNORMAL LOW (ref 8.9–10.3)
Chloride: 100 mmol/L (ref 98–111)
Creatinine, Ser: 1.86 mg/dL — ABNORMAL HIGH (ref 0.44–1.00)
GFR calc Af Amer: 30 mL/min — ABNORMAL LOW (ref >60)
GFR calc non Af Amer: 26 mL/min — ABNORMAL LOW (ref >60)
Glucose, Bld: 74 mg/dL (ref 70–99)
Potassium: 3 mmol/L — ABNORMAL LOW (ref 3.5–5.1)
Sodium: 136 mmol/L (ref 135–145)

## 2019-11-20 LAB — CBC
HCT: 29.3 % — ABNORMAL LOW (ref 36.0–46.0)
Hemoglobin: 8.4 g/dL — ABNORMAL LOW (ref 12.0–15.0)
MCH: 26.7 pg (ref 26.0–34.0)
MCHC: 28.7 g/dL — ABNORMAL LOW (ref 30.0–36.0)
MCV: 93 fL (ref 80.0–100.0)
Platelets: 125 10*3/uL — ABNORMAL LOW (ref 150–400)
RBC: 3.15 MIL/uL — ABNORMAL LOW (ref 3.87–5.11)
RDW: 17.2 % — ABNORMAL HIGH (ref 11.5–15.5)
WBC: 16.4 10*3/uL — ABNORMAL HIGH (ref 4.0–10.5)
nRBC: 0 % (ref 0.0–0.2)

## 2019-11-20 LAB — HEMOGLOBIN A1C
Hgb A1c MFr Bld: 4.7 % — ABNORMAL LOW (ref 4.8–5.6)
Mean Plasma Glucose: 88.19 mg/dL

## 2019-11-20 LAB — GLUCOSE, CAPILLARY
Glucose-Capillary: 74 mg/dL (ref 70–99)
Glucose-Capillary: 86 mg/dL (ref 70–99)
Glucose-Capillary: 115 mg/dL — ABNORMAL HIGH (ref 70–99)
Glucose-Capillary: 140 mg/dL — ABNORMAL HIGH (ref 70–99)

## 2019-11-20 LAB — SARS CORONAVIRUS 2 (TAT 6-24 HRS): SARS Coronavirus 2: NEGATIVE

## 2019-11-20 MED ORDER — SODIUM CHLORIDE 0.9 % IV SOLN
1.0000 g | INTRAVENOUS | Status: DC
  Administered 2019-11-20 – 2019-11-21 (×2): 1 g via INTRAVENOUS
  Filled 2019-11-20 (×4): qty 1

## 2019-11-20 MED ORDER — VANCOMYCIN HCL 750 MG/150ML IV SOLN
750.0000 mg | Freq: Once | INTRAVENOUS | Status: AC
  Administered 2019-11-20: 750 mg via INTRAVENOUS
  Filled 2019-11-20: qty 150

## 2019-11-20 MED ORDER — POTASSIUM PHOSPHATES 15 MMOLE/5ML IV SOLN
30.0000 mmol | Freq: Once | INTRAVENOUS | Status: AC
  Administered 2019-11-20: 13:00:00 30 mmol via INTRAVENOUS
  Filled 2019-11-20: qty 10

## 2019-11-20 NOTE — Progress Notes (Signed)
[facility]  ROUNDING NOTE   Subjective:  Patient well-known to [place] from prior admissions. She came in with altered mental status. She went for outpatient hemodialysis yesterday but was found to be unresponsive and sent here. Blood cultures drawn and still pending at the moment. Patient did undergo dialysis after she reached here yesterday.   Objective:  Vital signs in last 24 hours:  Temp:  [97.2 F (36.2 C)-98.1 F (36.7 C)] 97.5 F (36.4 C) (04/18 1136) Pulse Rate:  [56-70] 57 (04/18 1136) Resp:  [12-22] 20 (04/18 0523) BP: (89-166)/(40-57) 146/48 (04/18 1136) SpO2:  [95 %-100 %] 95 % (04/18 1136)  Weight change:  Filed Weights   11/19/19 1058  Weight: 75 kg    Intake/Output: I/O last 3 completed shifts: In: -  Out: 1000 [Other:1000]   Intake/Output this shift:  Total I/O In: 240 [P.O.:240] Out: -   Physical Exam: General: No acute distress  Head: Normocephalic, atraumatic. Moist oral mucosal membranes  Eyes: Anicteric  Neck: Supple, trachea midline  Lungs:  Clear to auscultation, normal effort  Heart: S1S2 no rubs  Abdomen:  Soft, nontender, bowel sounds present  Extremities: No peripheral edema.  Neurologic: Awake, alert, but confused  Skin: Warm/dry  Access: Right IJ PermCath    Basic Metabolic Panel: Recent Labs  Lab 11/19/19 1221 11/19/19 2121 11/20/19 0504  NA 132*  --  136  K 3.4*  --  3.0*  CL 99  --  100  CO2 26  --  30  GLUCOSE 100*  --  74  BUN 23  --  11  CREATININE 3.10*  --  1.86*  CALCIUM 8.4*  --  7.9*  PHOS  --  <1.0*  --     Liver Function Tests: Recent Labs  Lab 11/19/19 1221  AST 15  ALT 10  ALKPHOS 87  BILITOT 0.5  PROT 5.8*  ALBUMIN 1.5*   No results for input(s): LIPASE, AMYLASE in the last 168 hours. No results for input(s): AMMONIA in the last 168 hours.  CBC: Recent Labs  Lab 11/19/19 1111 11/20/19 0504  WBC 21.0* 16.4*  HGB 8.6* 8.4*  HCT 28.7* 29.3*  MCV 90.5 93.0  PLT 141* 125*     Cardiac Enzymes: No results for input(s): CKTOTAL, CKMB, CKMBINDEX, TROPONINI in the last 168 hours.  BNP: Invalid input(s): POCBNP  CBG: Recent Labs  Lab 11/19/19 1849 11/20/19 0725 11/20/19 1133 11/20/19 1637  GLUCAP 75 74 86 115*    Microbiology: Results for orders placed or performed during the hospital encounter of 11/19/19  Blood culture (routine x 2)     Status: None (Preliminary result)   Collection Time: 11/19/19 12:22 PM   Specimen: BLOOD  Result Value Ref Range Status   Specimen Description BLOOD LAC  Final   Special Requests   Final    BOTTLES DRAWN AEROBIC AND ANAEROBIC Blood Culture adequate volume   Culture   Final    NO GROWTH < 24 HOURS Performed at [facility], [address] [place], [place] [address]    Report Status PENDING  Incomplete  Blood culture (routine x 2)     Status: None (Preliminary result)   Collection Time: 11/19/19 12:32 PM   Specimen: BLOOD  Result Value Ref Range Status   Specimen Description BLOOD BLOOD LEFT HAND  Final   Special Requests   Final    BOTTLES DRAWN AEROBIC ONLY Blood Culture results may not be optimal due to an inadequate volume of  blood received in culture bottles   Culture   Final    NO GROWTH < 24 HOURS Performed at [facility], [address] [place], [place] [address]    Report Status PENDING  Incomplete  SARS CORONAVIRUS 2 (TAT 6-24 HRS) Nasopharyngeal Nasopharyngeal Swab     Status: None   Collection Time: 11/19/19  4:11 PM   Specimen: Nasopharyngeal Swab  Result Value Ref Range Status   SARS Coronavirus 2 NEGATIVE NEGATIVE Final    Comment: (NOTE) SARS-CoV-2 target nucleic acids are NOT DETECTED. The SARS-CoV-2 RNA is generally detectable in upper and lower respiratory specimens during the acute phase of infection. Negative results do not preclude SARS-CoV-2 infection, do not rule out co-infections with other pathogens, and should not be used as the sole basis for treatment  or other patient management decisions. Negative results must be combined with clinical observations, patient history, and epidemiological information. The expected result is Negative. Fact Sheet for Patients: SugarRoll.be Fact Sheet for Healthcare Providers: https://www.woods-mathews.com/ This test is not yet approved or cleared by the [place] FDA and  has been authorized for detection and/or diagnosis of SARS-CoV-2 by FDA under an Emergency Use Authorization (EUA). This EUA will remain  in effect (meaning this test can be used) for the duration of the COVID-19 declaration under Section 56 4(b)(1) of the Act, 21 U.S.C. section 360bbb-3(b)(1), unless the authorization is terminated or revoked sooner. Performed at [facility], [place] [address] [place], [place] [address]   Aerobic/Anaerobic Culture (surgical/deep wound)     Status: None (Preliminary result)   Collection Time: 11/19/19  6:04 PM   Specimen: Wound  Result Value Ref Range Status   Specimen Description   Final    WOUND Performed at [facility], [address] [place], [place] [address]    Special Requests   Final    Normal Performed at [facility], [address] [place], [place] [address]    Gram Stain NO WBC SEEN FEW GRAM NEGATIVE RODS   Final   Culture   Final    MODERATE ESCHERICHIA COLI SUSCEPTIBILITIES TO FOLLOW Performed at [facility], [place] [address] [place],  [address]    Report Status PENDING  Incomplete    Coagulation Studies: No results for input(s): LABPROT, INR in the last 72 hours.  Urinalysis: No results for input(s): COLORURINE, LABSPEC, PHURINE, GLUCOSEU, HGBUR, BILIRUBINUR, KETONESUR, PROTEINUR, UROBILINOGEN, NITRITE, LEUKOCYTESUR in the last 72 hours.  Invalid input(s): APPERANCEUR    Imaging: CT Head Wo Contrast  Result Date: 11/19/2019 CLINICAL DATA:  Altered mental status. EXAM: CT HEAD  WITHOUT CONTRAST TECHNIQUE: Contiguous axial images were obtained from the base of the skull through the vertex without intravenous contrast. COMPARISON:  03/29/2017 and brain MR dated 09/08/2019 FINDINGS: Brain: Stable mildly enlarged ventricles and cortical sulci. Stable mild patchy white matter low density in both cerebral hemispheres. No intracranial hemorrhage, mass lesion or CT evidence of acute infarction. Vascular: No hyperdense vessel or unexpected calcification. Skull: Normal. Negative for fracture or focal lesion. Sinuses/Orbits: Mild increase in posteriorly layering fluid/mucosal thickening in the sphenoid sinus on the left. Interval small amount of fluid in the left maxillary sinus. Status post cataract extraction on the right. Unremarkable left orbit. Other: None. IMPRESSION: 1. No acute abnormality. 2. Stable mild diffuse cerebral and cerebellar atrophy and mild chronic small vessel white matter ischemic changes in both cerebral hemispheres. 3. Mild increase in the amount of fluid/mucosal thickening in the sphenoid  sinus on the left. 4. Interval small amount of fluid in the left maxillary sinus, possibly due to acute sinusitis. Electronically Signed   By: Claudie Revering M.D.   On: 11/19/2019 12:17   DG Chest Port 1 View  Result Date: 11/19/2019 CLINICAL DATA:  Altered mental status, bradycardia EXAM: PORTABLE CHEST 1 VIEW COMPARISON:  11/03/2019 chest radiograph. FINDINGS: Right internal jugular central venous catheter terminates over high right atrium. Stable cardiomediastinal silhouette with mild cardiomegaly. No pneumothorax. No pleural effusion. No overt pulmonary edema. No acute consolidative airspace disease. IMPRESSION: Stable mild cardiomegaly without overt pulmonary edema. No active pulmonary disease. Electronically Signed   By: Ilona Sorrel M.D.   On: 11/19/2019 12:37     Medications:   . sodium chloride 250 mL (11/20/19 1141)  . ceFEPime (MAXIPIME) IV    . potassium PHOSPHATE IVPB  (in mmol) 30 mmol (11/20/19 1300)  . vancomycin    . vancomycin 750 mg (11/19/19 2350)   . atorvastatin  40 mg Oral q1800  . Chlorhexidine Gluconate Cloth  6 each Topical Q0600  . citalopram  10 mg Oral Daily  . divalproex  125 mg Oral BID  . docusate sodium  100 mg Oral BID  . donepezil  5 mg Oral QHS  . epoetin alfa  4,000 Units Subcutaneous Q T,Th,Sa-HD  . feeding supplement (NEPRO CARB STEADY)  237 mL Oral Q24H  . folic acid  761 mcg Oral Daily  . gabapentin  200 mg Oral QHS  . heparin  5,000 Units Subcutaneous Q8H  . insulin aspart  0-15 Units Subcutaneous TID WC  . insulin aspart  4 Units Subcutaneous TID WC  . labetalol  100 mg Oral BID  . lactulose  10 g Oral Daily  . levothyroxine  100 mcg Oral QAC breakfast  . memantine  5 mg Oral BID  . sevelamer carbonate  800 mg Oral TID WC  . sodium chloride flush  3 mL Intravenous Q12H   sodium chloride, acetaminophen, benzonatate, meclizine, ondansetron **OR** ondansetron (ZOFRAN) IV, sodium chloride flush  Assessment/ Plan:  79 y.o. female  is a 79 y.o. black female with end stage renal disease on hemodialysis, dementia, diabetes mellitus type II, hypertension, hypothyroidism, osteomyelitis now admitted with altered mental status which is recurrent.  [facility] ([place]) [facility] [address] TTS RIJ [phone].  1.  ESRD on HD TTS.  Patient completed dialysis yesterday.  No acute indication for dialysis today.  Next Alysis treatment on Tuesday.  2.  Altered mental status.  Work-up as per hospitalist.  Blood cultures have been taken.  3.  Anemia of chronic kidney disease.  Hemoglobin 8.4.  Resume Epogen with next dialysis treatment.  4.  Secondary hyperparathyroidism.  Phosphorus actually noted to be low at less than 1.  Discontinue Renvela.  5.  Hypokalemia.  Serum potassium low at 3.0.  Discussed with pharmacy.  Patient be administered K-Phos.   LOS: 1 [place] 4/18/20215:26 PM

## 2019-11-20 NOTE — Progress Notes (Addendum)
Pharmacy Antibiotic Note  Taylor Tyler is a 79 y.o. female admitted on 11/19/2019 with wound infection.  Pharmacy has been consulted for Vancomycin and cefepime dosing.  Pt is on HD every T-Th-Sat.   Plan: Vancomycin 1750 mg IV X 1 ordered for 4/17. - Dose was never given. Patient received vancomycin 750mg  IV x 1 dose after HD 4/17 Will order 750mg  IV x 1 dose now.   Then Vancomycin 750 mg IV Q T-Th-Sat with HD Will consider drawing 1st Vanc level before 3rd HD session  Will add Cefepime 1g IV every 24 hours   Nephrology has been consulted for renal replacement;  Will need to F/U future plans for HD and verify current Vanc dosing schedule is correct.   Height: 5\' 6"  (167.6 cm) Weight: 75 kg (165 lb 5.5 oz) IBW/kg (Calculated) : 59.3  Temp (24hrs), Avg:97.5 F (36.4 C), Min:97.2 F (36.2 C), Max:98.1 F (36.7 C)  Recent Labs  Lab 11/19/19 1111 11/19/19 1221 11/20/19 0504  WBC 21.0*  --  16.4*  CREATININE  --  3.10* 1.86*  LATICACIDVEN  --  1.4  --     Estimated Creatinine Clearance: 26.6 mL/min (A) (by C-G formula based on SCr of 1.86 mg/dL (H)).    Allergies  Allergen Reactions  . Clams [Shellfish Allergy] Swelling and Other (See Comments)    THROAT SWELLS NECK TURNS RED  . Hydralazine Other (See Comments)    CHEST TIGHTNESS Patient has tolerated multiple doses of hydralazine since allergy was listed   . Norvasc [Amlodipine Besylate] Swelling    Leg edema   . Milk-Related Compounds Diarrhea    Antimicrobials this admission: 4/17 vancomycin  >>  4/18 cefepime >>  Microbiology results:  4/17 BCx: NG TD  Thank you for allowing pharmacy to be a part of this patient's care.  Pernell Dupre, PharmD, BCPS Clinical Pharmacist 11/20/2019 11:18 AM

## 2019-11-20 NOTE — Progress Notes (Addendum)
Progress Note    Taylor Tyler  SWF:[id number] DOB: July 02, 1941  DOA: 11/19/2019 PCP: McLean-Scocuzza, Nino Glow, MD      Brief Narrative:    Medical records reviewed and are as summarized below:  Taylor Tyler is an 79 y.o. female with medical history significant forend-stage renal disease on dialysis(T/TH/S),Covid + inDecember 2020,  repeat Covid test was negative on 11/03/19,history of dementia and diabetes mellitus who was sent to the emergency room for evaluation of mental status changes.  Patient was dropped off for her dialysis on 11/19/2019 and when the dialysis nurse went to assess her, the patient was found unresponsive .She was sent to the emergency room for further evaluation .       Assessment/Plan:   Principal Problem:   Acute metabolic encephalopathy Active Problems:   Hypothyroidism   Obesity, Class III, BMI 40-49.9 (morbid obesity) (HCC)   ESRD (end stage renal disease) (HCC)   Dementia without behavioral disturbance (HCC)   Leukocytosis   Acute toxic metabolic encephalopathy: Improved.  Wound infection on lower back/leukocytosis: Continue empiric IV antibiotics.  Consult wound care nurse for further management.  Wound culture growing gram-negative rods.  Follow-up wound and blood cultures.  End-stage renal disease on hemodialysis Follow-up with nephrologist for hemodialysis.  Anemia of chronic kidney disease  Stable  Unstageable sacral decubitus ulcer and left heel deep tissue injury all present on admission: Consult wound care nurse for further management  Body mass index is 26.69 kg/m.   Family Communication/Anticipated D/C date and plan/Code Status   DVT prophylaxis: Heparin Code Status: Full code Family Communication: Plan discussed with patient Disposition Plan:    Status is: Inpatient  Remains inpatient appropriate because:Inpatient level of care appropriate due to severity of illness   Dispo: The patient is from: Home          Anticipated d/c is to: Home              Anticipated d/c date is: 2 days              Patient currently is not medically stable to d/c.            Subjective:   She complains of lower back pain otherwise she feels okay.  Objective:    Vitals:   11/19/19 2200 11/19/19 2230 11/19/19 2333 11/20/19 0523  BP: (!) 101/43 (!) 104/49 (!) 120/43 (!) 135/57  Pulse: 65 67 62 64  Resp: 16 16 18 20   Temp:  (!) 97.2 F (36.2 C) 98.1 F (36.7 C) (!) 97.4 F (36.3 C)  TempSrc:  Oral Oral Oral  SpO2: 98% 100% 99% 96%  Weight:      Height:        Intake/Output Summary (Last 24 hours) at 11/20/2019 1026 Last data filed at 11/20/2019 2202 Gross per 24 hour  Intake 240 ml  Output 1000 ml  Net -760 ml   Filed Weights   11/19/19 1058  Weight: 75 kg    Exam:  GEN: NAD SKIN: Unstageable sacral decubitus ulcer, left heel deep tissue injury EYES: EOMI ENT: MMM CV: RRR PULM: CTA B ABD: soft, ND, NT, +BS CNS: AAO x 3, non focal EXT: No edema or tenderness   Data Reviewed:   I have personally reviewed following labs and imaging studies:  Labs: Labs show the following:   Basic Metabolic Panel: Recent Labs  Lab 11/19/19 1221 11/19/19 2121 11/20/19 0504  NA 132*  --  136  K  3.4*  --  3.0*  CL 99  --  100  CO2 26  --  30  GLUCOSE 100*  --  74  BUN 23  --  11  CREATININE 3.10*  --  1.86*  CALCIUM 8.4*  --  7.9*  PHOS  --  <1.0*  --    GFR Estimated Creatinine Clearance: 26.6 mL/min (A) (by C-G formula based on SCr of 1.86 mg/dL (H)). Liver Function Tests: Recent Labs  Lab 11/19/19 1221  AST 15  ALT 10  ALKPHOS 87  BILITOT 0.5  PROT 5.8*  ALBUMIN 1.5*   No results for input(s): LIPASE, AMYLASE in the last 168 hours. No results for input(s): AMMONIA in the last 168 hours. Coagulation profile No results for input(s): INR, PROTIME in the last 168 hours.  CBC: Recent Labs  Lab 11/19/19 1111 11/20/19 0504  WBC 21.0* 16.4*  HGB 8.6* 8.4*  HCT  28.7* 29.3*  MCV 90.5 93.0  PLT 141* 125*   Cardiac Enzymes: No results for input(s): CKTOTAL, CKMB, CKMBINDEX, TROPONINI in the last 168 hours. BNP (last 3 results) No results for input(s): PROBNP in the last 8760 hours. CBG: Recent Labs  Lab 11/19/19 1849 11/20/19 0725  GLUCAP 75 74   D-Dimer: No results for input(s): DDIMER in the last 72 hours. Hgb A1c: No results for input(s): HGBA1C in the last 72 hours. Lipid Profile: No results for input(s): CHOL, HDL, LDLCALC, TRIG, CHOLHDL, LDLDIRECT in the last 72 hours. Thyroid function studies: No results for input(s): TSH, T4TOTAL, T3FREE, THYROIDAB in the last 72 hours.  Invalid input(s): FREET3 Anemia work up: No results for input(s): VITAMINB12, FOLATE, FERRITIN, TIBC, IRON, RETICCTPCT in the last 72 hours. Sepsis Labs: Recent Labs  Lab 11/19/19 1111 11/19/19 1221 11/20/19 0504  WBC 21.0*  --  16.4*  LATICACIDVEN  --  1.4  --     Microbiology Recent Results (from the past 240 hour(s))  Blood culture (routine x 2)     Status: None (Preliminary result)   Collection Time: 11/19/19 12:22 PM   Specimen: BLOOD  Result Value Ref Range Status   Specimen Description BLOOD LAC  Final   Special Requests   Final    BOTTLES DRAWN AEROBIC AND ANAEROBIC Blood Culture adequate volume   Culture   Final    NO GROWTH < 24 HOURS Performed at [facility], [address] [place], [place] [address]    Report Status PENDING  Incomplete  Blood culture (routine x 2)     Status: None (Preliminary result)   Collection Time: 11/19/19 12:32 PM   Specimen: BLOOD  Result Value Ref Range Status   Specimen Description BLOOD BLOOD LEFT HAND  Final   Special Requests   Final    BOTTLES DRAWN AEROBIC ONLY Blood Culture results may not be optimal due to an inadequate volume of blood received in culture bottles   Culture   Final    NO GROWTH < 24 HOURS Performed at [facility], [address] [place], [place] [address]      Report Status PENDING  Incomplete  SARS CORONAVIRUS 2 (TAT 6-24 HRS) Nasopharyngeal Nasopharyngeal Swab     Status: None   Collection Time: 11/19/19  4:11 PM   Specimen: Nasopharyngeal Swab  Result Value Ref Range Status   SARS Coronavirus 2 NEGATIVE NEGATIVE Final    Comment: (NOTE) SARS-CoV-2 target nucleic acids are NOT DETECTED. The SARS-CoV-2 RNA is generally detectable in upper and lower respiratory specimens  during the acute phase of infection. Negative results do not preclude SARS-CoV-2 infection, do not rule out co-infections with other pathogens, and should not be used as the sole basis for treatment or other patient management decisions. Negative results must be combined with clinical observations, patient history, and epidemiological information. The expected result is Negative. Fact Sheet for Patients: SugarRoll.be Fact Sheet for Healthcare Providers: https://www.woods-mathews.com/ This test is not yet approved or cleared by the [place] FDA and  has been authorized for detection and/or diagnosis of SARS-CoV-2 by FDA under an Emergency Use Authorization (EUA). This EUA will remain  in effect (meaning this test can be used) for the duration of the COVID-19 declaration under Section 56 4(b)(1) of the Act, 21 U.S.C. section 360bbb-3(b)(1), unless the authorization is terminated or revoked sooner. Performed at [facility], [place] [address], [place], [place] [address]     Procedures and diagnostic studies:  CT Head Wo Contrast  Result Date: 11/19/2019 CLINICAL DATA:  Altered mental status. EXAM: CT HEAD WITHOUT CONTRAST TECHNIQUE: Contiguous axial images were obtained from the base of the skull through the vertex without intravenous contrast. COMPARISON:  03/29/2017 and brain MR dated 09/08/2019 FINDINGS: Brain: Stable mildly enlarged ventricles and cortical sulci. Stable mild patchy white matter low density in both  cerebral hemispheres. No intracranial hemorrhage, mass lesion or CT evidence of acute infarction. Vascular: No hyperdense vessel or unexpected calcification. Skull: Normal. Negative for fracture or focal lesion. Sinuses/Orbits: Mild increase in posteriorly layering fluid/mucosal thickening in the sphenoid sinus on the left. Interval small amount of fluid in the left maxillary sinus. Status post cataract extraction on the right. Unremarkable left orbit. Other: None. IMPRESSION: 1. No acute abnormality. 2. Stable mild diffuse cerebral and cerebellar atrophy and mild chronic small vessel white matter ischemic changes in both cerebral hemispheres. 3. Mild increase in the amount of fluid/mucosal thickening in the sphenoid sinus on the left. 4. Interval small amount of fluid in the left maxillary sinus, possibly due to acute sinusitis. Electronically Signed   By: Claudie Revering M.D.   On: 11/19/2019 12:17   DG Chest Port 1 View  Result Date: 11/19/2019 CLINICAL DATA:  Altered mental status, bradycardia EXAM: PORTABLE CHEST 1 VIEW COMPARISON:  11/03/2019 chest radiograph. FINDINGS: Right internal jugular central venous catheter terminates over high right atrium. Stable cardiomediastinal silhouette with mild cardiomegaly. No pneumothorax. No pleural effusion. No overt pulmonary edema. No acute consolidative airspace disease. IMPRESSION: Stable mild cardiomegaly without overt pulmonary edema. No active pulmonary disease. Electronically Signed   By: Ilona Sorrel M.D.   On: 11/19/2019 12:37    Medications:   . atorvastatin  40 mg Oral q1800  . Chlorhexidine Gluconate Cloth  6 each Topical Q0600  . citalopram  10 mg Oral Daily  . divalproex  125 mg Oral BID  . docusate sodium  100 mg Oral BID  . donepezil  5 mg Oral QHS  . epoetin alfa  4,000 Units Subcutaneous Q T,Th,Sa-HD  . feeding supplement (NEPRO CARB STEADY)  237 mL Oral Q24H  . folic acid  562 mcg Oral Daily  . gabapentin  200 mg Oral QHS  . heparin   5,000 Units Subcutaneous Q8H  . insulin aspart  0-15 Units Subcutaneous TID WC  . insulin aspart  4 Units Subcutaneous TID WC  . labetalol  100 mg Oral BID  . lactulose  10 g Oral Daily  . levothyroxine  100 mcg Oral QAC breakfast  . memantine  5  mg Oral BID  . sevelamer carbonate  800 mg Oral TID WC  . sodium chloride flush  3 mL Intravenous Q12H   Continuous Infusions: . sodium chloride    . vancomycin    . vancomycin 750 mg (11/19/19 2350)     LOS: 1 day   Kenni Newton  [facility]     11/20/2019, 10:26 AM

## 2019-11-21 ENCOUNTER — Inpatient Hospital Stay: Payer: Medicare Other | Attending: Oncology

## 2019-11-21 DIAGNOSIS — T148XXA Other injury of unspecified body region, initial encounter: Secondary | ICD-10-CM | POA: Diagnosis present

## 2019-11-21 DIAGNOSIS — L089 Local infection of the skin and subcutaneous tissue, unspecified: Secondary | ICD-10-CM | POA: Diagnosis present

## 2019-11-21 LAB — CBC WITH DIFFERENTIAL/PLATELET
Abs Immature Granulocytes: 0.12 10*3/uL — ABNORMAL HIGH (ref 0.00–0.07)
Basophils Absolute: 0 10*3/uL (ref 0.0–0.1)
Basophils Relative: 0 %
Eosinophils Absolute: 0.1 10*3/uL (ref 0.0–0.5)
Eosinophils Relative: 1 %
HCT: 28.3 % — ABNORMAL LOW (ref 36.0–46.0)
Hemoglobin: 8.2 g/dL — ABNORMAL LOW (ref 12.0–15.0)
Immature Granulocytes: 1 %
Lymphocytes Relative: 16 %
Lymphs Abs: 1.6 10*3/uL (ref 0.7–4.0)
MCH: 26.9 pg (ref 26.0–34.0)
MCHC: 29 g/dL — ABNORMAL LOW (ref 30.0–36.0)
MCV: 92.8 fL (ref 80.0–100.0)
Monocytes Absolute: 0.7 10*3/uL (ref 0.1–1.0)
Monocytes Relative: 7 %
Neutro Abs: 7.4 10*3/uL (ref 1.7–7.7)
Neutrophils Relative %: 75 %
Platelets: 123 10*3/uL — ABNORMAL LOW (ref 150–400)
RBC: 3.05 MIL/uL — ABNORMAL LOW (ref 3.87–5.11)
RDW: 17.5 % — ABNORMAL HIGH (ref 11.5–15.5)
Smear Review: NORMAL
WBC: 10 10*3/uL (ref 4.0–10.5)
nRBC: 0 % (ref 0.0–0.2)

## 2019-11-21 LAB — BASIC METABOLIC PANEL
Anion gap: 8 (ref 5–15)
BUN: 21 mg/dL (ref 8–23)
CO2: 25 mmol/L (ref 22–32)
Calcium: 8.1 mg/dL — ABNORMAL LOW (ref 8.9–10.3)
Chloride: 100 mmol/L (ref 98–111)
Creatinine, Ser: 2.68 mg/dL — ABNORMAL HIGH (ref 0.44–1.00)
GFR calc Af Amer: 19 mL/min — ABNORMAL LOW (ref >60)
GFR calc non Af Amer: 17 mL/min — ABNORMAL LOW (ref >60)
Glucose, Bld: 121 mg/dL — ABNORMAL HIGH (ref 70–99)
Potassium: 3.2 mmol/L — ABNORMAL LOW (ref 3.5–5.1)
Sodium: 133 mmol/L — ABNORMAL LOW (ref 135–145)

## 2019-11-21 LAB — GLUCOSE, CAPILLARY
Glucose-Capillary: 111 mg/dL — ABNORMAL HIGH (ref 70–99)
Glucose-Capillary: 126 mg/dL — ABNORMAL HIGH (ref 70–99)
Glucose-Capillary: 100 mg/dL — ABNORMAL HIGH (ref 70–99)
Glucose-Capillary: 100 mg/dL — ABNORMAL HIGH (ref 70–99)

## 2019-11-21 LAB — PHOSPHORUS: Phosphorus: 1.6 mg/dL — ABNORMAL LOW (ref 2.5–4.6)

## 2019-11-21 MED ORDER — POTASSIUM PHOSPHATES 15 MMOLE/5ML IV SOLN
30.0000 mmol | Freq: Once | INTRAVENOUS | Status: AC
  Administered 2019-11-21: 18:00:00 30 mmol via INTRAVENOUS
  Filled 2019-11-21: qty 10

## 2019-11-21 MED ORDER — COLLAGENASE 250 UNIT/GM EX OINT
TOPICAL_OINTMENT | Freq: Every day | CUTANEOUS | Status: DC
  Filled 2019-11-21: qty 30

## 2019-11-21 NOTE — Consult Note (Signed)
WOC Nurse Consult Note: Reason for Consult:CHronic nonhealing lumbar surgical wound with copious purulence Unstageable pressure injury to coccyx, present on admisison Moisture associated skin damage to sacral skin and buttocks fold from loose stools.  Wound type:Pressure and moisture Pressure Injury POA: Yes/ Measurement:lumbar spine:"  8 cm x 3 cm x 1 cm  Coccyx:  3 cm x 3 cm 100% slough Scattered 0.5 cm full thickness breakdown Wound PPJ:KDTOIZTIWPY tissue Surgical wound is pale pink and nongranulating Drainage (amount, consistency, odor) copious purulence from lumbar wound Coccyx with minimal serosanguinous Scant serosanguinous to MASD Periwound:intact Heels are at risk and have prophylactic dressing and PRevalon boots in place.  Patient is on mattress with low air loss feature for pressure redistribution Dressing procedure/placement/frequency: Cleanse lumbar wound with NS and pat dry. Fill wound with Aquacel .  Use entire 4x4 sheet due to drainage.  Top with dry gauze and ABD pad and tape. Change daily and PRN soilage.  Cleanse cocyx wound with NS and pat dry.  Apply Santyl to wound bed.  Cover with NS moist gauze.  Secure with sacral foam dressing. Change daily.  Barrier cream to buttocks and perineal skin twice daily and PRN soilage.   Will not follow at this time.  Please re-consult if needed.  Domenic Moras MSN, RN, FNP-BC CWON Wound, Ostomy, Continence Nurse Pager [phone]

## 2019-11-21 NOTE — Progress Notes (Addendum)
Progress Note    Taylor Tyler  PZW:[id number] DOB: [birth year]  DOA: 11/19/2019 PCP: McLean-Scocuzza, Nino Glow, MD      Brief Narrative:    Medical records reviewed and are as summarized below:  Taylor Tyler is an 79 y.o. female with medical history significant forend-stage renal disease on dialysis(T/TH/S),Covid + inDecember 2020,  repeat Covid test was negative on 11/03/19,history of dementia and diabetes mellitus who was sent to the emergency room for evaluation of mental status changes.  Patient was dropped off for her dialysis on 11/19/2019 and when the dialysis nurse went to assess her, the patient was found unresponsive .She was sent to the emergency room for further evaluation .       Assessment/Plan:   Principal Problem:   Acute metabolic encephalopathy Active Problems:   Hypothyroidism   Obesity, Class III, BMI 40-49.9 (morbid obesity) (HCC)   ESRD (end stage renal disease) (HCC)   Dementia without behavioral disturbance (HCC)   Leukocytosis   Wound infection   Acute toxic metabolic encephalopathy: Improved.  Wound infection on lower back: Leukocytosis has resolved.  Wound culture showed E. coli and Proteus mirabilis.  De-escalate IV antibiotics from cefepime and vancomycin to Unasyn.   End-stage renal disease on hemodialysis Follow-up with nephrologist for hemodialysis.  Anemia of chronic kidney disease  Stable  Unstageable coccygeal decubitus ulcer: Present on admission.  Appreciate assistance from wound care nurse.  Continue local wound care.  Body mass index is 26.69 kg/m.         Family Communication/Anticipated D/C date and plan/Code Status   DVT prophylaxis: Heparin Code Status: Full code Family Communication: Plan discussed with patient Disposition Plan:    Status is: Inpatient  Remains inpatient appropriate because:Inpatient level of care appropriate due to severity of illness   Dispo: The patient is from: SNF    Anticipated d/c is to: SNF              Anticipated d/c date is: 1 day              Patient currently is not medically stable to d/c.            Subjective:   Lower back pain is better today.  No shortness of breath or chest pain.  Objective:    Vitals:   11/20/19 1136 11/20/19 1950 11/21/19 0546 11/21/19 1144  BP: (!) 146/48 (!) 131/58 126/71 (!) 142/59  Pulse: (!) 57 68 77 67  Resp:  17 20 16   Temp: (!) 97.5 F (36.4 C) 99 F (37.2 C) 99.1 F (37.3 C) 99.2 F (37.3 C)  TempSrc: Oral Oral Oral Oral  SpO2: 95% 98% 98% 98%  Weight:      Height:        Intake/Output Summary (Last 24 hours) at 11/21/2019 1550 Last data filed at 11/21/2019 1414 Gross per 24 hour  Intake 938.3 ml  Output --  Net 938.3 ml   Filed Weights   11/19/19 1058  Weight: 75 kg    Exam:  GEN: No acute distress SKIN: Unstageable sacral decubitus ulcer, left heel deep tissue injury EYES: EOMI ENT: MMM CV: RRR PULM: No wheezing or rales heard ABD: soft, ND, NT, +BS CNS: AAO x 3, non focal EXT: No edema or tenderness   Data Reviewed:   I have personally reviewed following labs and imaging studies:  Labs: Labs show the following:   Basic Metabolic Panel: Recent Labs  Lab 11/19/19 1221 11/19/19 1221  11/19/19 2121 11/20/19 0504 11/21/19 0826  NA 132*  --   --  136 133*  K 3.4*   < >  --  3.0* 3.2*  CL 99  --   --  100 100  CO2 26  --   --  30 25  GLUCOSE 100*  --   --  74 121*  BUN 23  --   --  11 21  CREATININE 3.10*  --   --  1.86* 2.68*  CALCIUM 8.4*  --   --  7.9* 8.1*  PHOS  --   --  <1.0*  --  1.6*   < > = values in this interval not displayed.   GFR Estimated Creatinine Clearance: 18.5 mL/min (A) (by C-G formula based on SCr of 2.68 mg/dL (H)). Liver Function Tests: Recent Labs  Lab 11/19/19 1221  AST 15  ALT 10  ALKPHOS 87  BILITOT 0.5  PROT 5.8*  ALBUMIN 1.5*   No results for input(s): LIPASE, AMYLASE in the last 168 hours. No results for  input(s): AMMONIA in the last 168 hours. Coagulation profile No results for input(s): INR, PROTIME in the last 168 hours.  CBC: Recent Labs  Lab 11/19/19 1111 11/20/19 0504 11/21/19 0826  WBC 21.0* 16.4* 10.0  NEUTROABS  --   --  7.4  HGB 8.6* 8.4* 8.2*  HCT 28.7* 29.3* 28.3*  MCV 90.5 93.0 92.8  PLT 141* 125* 123*   Cardiac Enzymes: No results for input(s): CKTOTAL, CKMB, CKMBINDEX, TROPONINI in the last 168 hours. BNP (last 3 results) No results for input(s): PROBNP in the last 8760 hours. CBG: Recent Labs  Lab 11/20/19 1133 11/20/19 1637 11/20/19 2133 11/21/19 0747 11/21/19 1141  GLUCAP 86 115* 140* 111* 126*   D-Dimer: No results for input(s): DDIMER in the last 72 hours. Hgb A1c: Recent Labs    11/19/19 1111  HGBA1C 4.7*   Lipid Profile: No results for input(s): CHOL, HDL, LDLCALC, TRIG, CHOLHDL, LDLDIRECT in the last 72 hours. Thyroid function studies: No results for input(s): TSH, T4TOTAL, T3FREE, THYROIDAB in the last 72 hours.  Invalid input(s): FREET3 Anemia work up: No results for input(s): VITAMINB12, FOLATE, FERRITIN, TIBC, IRON, RETICCTPCT in the last 72 hours. Sepsis Labs: Recent Labs  Lab 11/19/19 1111 11/19/19 1221 11/20/19 0504 11/21/19 0826  WBC 21.0*  --  16.4* 10.0  LATICACIDVEN  --  1.4  --   --     Microbiology Recent Results (from the past 240 hour(s))  Blood culture (routine x 2)     Status: None (Preliminary result)   Collection Time: 11/19/19 12:22 PM   Specimen: BLOOD  Result Value Ref Range Status   Specimen Description BLOOD LAC  Final   Special Requests   Final    BOTTLES DRAWN AEROBIC AND ANAEROBIC Blood Culture adequate volume   Culture   Final    NO GROWTH 2 DAYS Performed at [facility], [address] [place], [place] [address]    Report Status PENDING  Incomplete  Blood culture (routine x 2)     Status: None (Preliminary result)   Collection Time: 11/19/19 12:32 PM   Specimen: BLOOD  Result  Value Ref Range Status   Specimen Description BLOOD BLOOD LEFT HAND  Final   Special Requests   Final    BOTTLES DRAWN AEROBIC ONLY Blood Culture results may not be optimal due to an inadequate volume of blood received in culture bottles   Culture   Final  NO GROWTH 2 DAYS Performed at [facility], [place], [place] [address]    Report Status PENDING  Incomplete  SARS CORONAVIRUS 2 (TAT 6-24 HRS) Nasopharyngeal Nasopharyngeal Swab     Status: None   Collection Time: 11/19/19  4:11 PM   Specimen: Nasopharyngeal Swab  Result Value Ref Range Status   SARS Coronavirus 2 NEGATIVE NEGATIVE Final    Comment: (NOTE) SARS-CoV-2 target nucleic acids are NOT DETECTED. The SARS-CoV-2 RNA is generally detectable in upper and lower respiratory specimens during the acute phase of infection. Negative results do not preclude SARS-CoV-2 infection, do not rule out co-infections with other pathogens, and should not be used as the sole basis for treatment or other patient management decisions. Negative results must be combined with clinical observations, patient history, and epidemiological information. The expected result is Negative. Fact Sheet for Patients: SugarRoll.be Fact Sheet for Healthcare Providers: https://www.woods-mathews.com/ This test is not yet approved or cleared by the [place] FDA and  has been authorized for detection and/or diagnosis of SARS-CoV-2 by FDA under an Emergency Use Authorization (EUA). This EUA will remain  in effect (meaning this test can be used) for the duration of the COVID-19 declaration under Section 56 4(b)(1) of the Act, 21 U.S.C. section 360bbb-3(b)(1), unless the authorization is terminated or revoked sooner. Performed at [facility], [place] [address] [place], [place] [address]   Aerobic/Anaerobic Culture (surgical/deep wound)     Status: None (Preliminary result)   Collection  Time: 11/19/19  6:04 PM   Specimen: Wound  Result Value Ref Range Status   Specimen Description   Final    WOUND Performed at [facility], [address] [place], [place] [address]    Special Requests   Final    Normal Performed at [facility], [place], [place] [address]    Gram Stain NO WBC SEEN FEW GRAM NEGATIVE RODS   Final   Culture   Final    MODERATE ESCHERICHIA COLI MODERATE PROTEUS MIRABILIS HOLDING FOR POSSIBLE ANAEROBE Performed at [facility], [address] [place],  [address]    Report Status PENDING  Incomplete   Organism ID, Bacteria ESCHERICHIA COLI  Final      Susceptibility   Escherichia coli - MIC*    AMPICILLIN >=32 RESISTANT Resistant     CEFAZOLIN >=64 RESISTANT Resistant     CEFEPIME <=0.12 SENSITIVE Sensitive     CEFTAZIDIME 2 SENSITIVE Sensitive     CEFTRIAXONE 2 INTERMEDIATE Intermediate     CIPROFLOXACIN <=0.25 SENSITIVE Sensitive     GENTAMICIN <=1 SENSITIVE Sensitive     IMIPENEM <=0.25 SENSITIVE Sensitive     TRIMETH/SULFA <=20 SENSITIVE Sensitive     AMPICILLIN/SULBACTAM 8 SENSITIVE Sensitive     PIP/TAZO <=4 SENSITIVE Sensitive     * MODERATE ESCHERICHIA COLI    Procedures and diagnostic studies:  No results found.  Medications:   . atorvastatin  40 mg Oral q1800  . Chlorhexidine Gluconate Cloth  6 each Topical Q0600  . citalopram  10 mg Oral Daily  . collagenase   Topical Daily  . divalproex  125 mg Oral BID  . docusate sodium  100 mg Oral BID  . donepezil  5 mg Oral QHS  . epoetin alfa  4,000 Units Subcutaneous Q T,Th,Sa-HD  . feeding supplement (NEPRO CARB STEADY)  237 mL Oral Q24H  . folic acid  893 mcg Oral Daily  . gabapentin  200  mg Oral QHS  . heparin  5,000 Units Subcutaneous Q8H  . insulin aspart  0-15 Units Subcutaneous TID WC  . insulin aspart  4 Units Subcutaneous TID WC  . labetalol  100 mg Oral BID  . lactulose  10 g Oral Daily  . levothyroxine  100 mcg Oral  QAC breakfast  . memantine  5 mg Oral BID  . sodium chloride flush  3 mL Intravenous Q12H   Continuous Infusions: . sodium chloride Stopped (11/20/19 2313)  . ceFEPime (MAXIPIME) IV Stopped (11/20/19 2124)  . potassium PHOSPHATE IVPB (in mmol)    . vancomycin    . vancomycin Stopped (11/20/19 0053)     LOS: 2 days   Madasyn Heath  [facility]     11/21/2019, 3:50 PM

## 2019-11-21 NOTE — Progress Notes (Signed)
Hemodialysis patient known at [facility] TTS 9:20am, patient is a resident at [facility]. Please contact me with any dialysis placement concerns.

## 2019-11-21 NOTE — Progress Notes (Signed)
[facility]  ROUNDING NOTE   Subjective:  Patient lethargic but arousable today. Due for hemodialysis again tomorrow. Wound culture positive for E. coli.  Objective:  Vital signs in last 24 hours:  Temp:  [99 F (37.2 C)-99.2 F (37.3 C)] 99.2 F (37.3 C) (04/19 1144) Pulse Rate:  [67-77] 67 (04/19 1144) Resp:  [16-20] 16 (04/19 1144) BP: (126-142)/(58-71) 142/59 (04/19 1144) SpO2:  [98 %] 98 % (04/19 1144)  Weight change:  Filed Weights   11/19/19 1058  Weight: 75 kg    Intake/Output: I/O last 3 completed shifts: In: 518.3 [P.O.:240; I.V.:27.2; IV Piggyback:251.1] Out: 1000 [Other:1000]   Intake/Output this shift:  Total I/O In: 660 [P.O.:660] Out: -   Physical Exam: General: No acute distress  Head: Normocephalic, atraumatic. Moist oral mucosal membranes  Eyes: Anicteric  Neck: Supple, trachea midline  Lungs:  Clear to auscultation, normal effort  Heart: S1S2 no rubs  Abdomen:  Soft, nontender, bowel sounds present  Extremities: No peripheral edema.  Neurologic: Lethargic but arousable and confused  Skin: Warm/dry  Access: Right IJ PermCath    Basic Metabolic Panel: Recent Labs  Lab 11/19/19 1221 11/19/19 2121 11/20/19 0504 11/21/19 0826  NA 132*  --  136 133*  K 3.4*  --  3.0* 3.2*  CL 99  --  100 100  CO2 26  --  30 25  GLUCOSE 100*  --  74 121*  BUN 23  --  11 21  CREATININE 3.10*  --  1.86* 2.68*  CALCIUM 8.4*  --  7.9* 8.1*  PHOS  --  <1.0*  --  1.6*    Liver Function Tests: Recent Labs  Lab 11/19/19 1221  AST 15  ALT 10  ALKPHOS 87  BILITOT 0.5  PROT 5.8*  ALBUMIN 1.5*   No results for input(s): LIPASE, AMYLASE in the last 168 hours. No results for input(s): AMMONIA in the last 168 hours.  CBC: Recent Labs  Lab 11/19/19 1111 11/20/19 0504 11/21/19 0826  WBC 21.0* 16.4* 10.0  NEUTROABS  --   --  7.4  HGB 8.6* 8.4* 8.2*  HCT 28.7* 29.3* 28.3*  MCV 90.5 93.0 92.8  PLT 141* 125* 123*    Cardiac  Enzymes: No results for input(s): CKTOTAL, CKMB, CKMBINDEX, TROPONINI in the last 168 hours.  BNP: Invalid input(s): POCBNP  CBG: Recent Labs  Lab 11/20/19 1133 11/20/19 1637 11/20/19 2133 11/21/19 0747 11/21/19 1141  GLUCAP 86 115* 140* 111* 126*    Microbiology: Results for orders placed or performed during the hospital encounter of 11/19/19  Blood culture (routine x 2)     Status: None (Preliminary result)   Collection Time: 11/19/19 12:22 PM   Specimen: BLOOD  Result Value Ref Range Status   Specimen Description BLOOD LAC  Final   Special Requests   Final    BOTTLES DRAWN AEROBIC AND ANAEROBIC Blood Culture adequate volume   Culture   Final    NO GROWTH 2 DAYS Performed at [facility], [address] [place], [place] [address]    Report Status PENDING  Incomplete  Blood culture (routine x 2)     Status: None (Preliminary result)   Collection Time: 11/19/19 12:32 PM   Specimen: BLOOD  Result Value Ref Range Status   Specimen Description BLOOD BLOOD LEFT HAND  Final   Special Requests   Final    BOTTLES DRAWN AEROBIC ONLY Blood Culture results may not be optimal due to an inadequate volume of  blood received in culture bottles   Culture   Final    NO GROWTH 2 DAYS Performed at [facility], [address] [place], [place] [address]    Report Status PENDING  Incomplete  SARS CORONAVIRUS 2 (TAT 6-24 HRS) Nasopharyngeal Nasopharyngeal Swab     Status: None   Collection Time: 11/19/19  4:11 PM   Specimen: Nasopharyngeal Swab  Result Value Ref Range Status   SARS Coronavirus 2 NEGATIVE NEGATIVE Final    Comment: (NOTE) SARS-CoV-2 target nucleic acids are NOT DETECTED. The SARS-CoV-2 RNA is generally detectable in upper and lower respiratory specimens during the acute phase of infection. Negative results do not preclude SARS-CoV-2 infection, do not rule out co-infections with other pathogens, and should not be used as the sole basis for  treatment or other patient management decisions. Negative results must be combined with clinical observations, patient history, and epidemiological information. The expected result is Negative. Fact Sheet for Patients: SugarRoll.be Fact Sheet for Healthcare Providers: https://www.woods-mathews.com/ This test is not yet approved or cleared by the [place] FDA and  has been authorized for detection and/or diagnosis of SARS-CoV-2 by FDA under an Emergency Use Authorization (EUA). This EUA will remain  in effect (meaning this test can be used) for the duration of the COVID-19 declaration under Section 56 4(b)(1) of the Act, 21 U.S.C. section 360bbb-3(b)(1), unless the authorization is terminated or revoked sooner. Performed at [facility], [place] [address] [place], [place] [address]   Aerobic/Anaerobic Culture (surgical/deep wound)     Status: None (Preliminary result)   Collection Time: 11/19/19  6:04 PM   Specimen: Wound  Result Value Ref Range Status   Specimen Description   Final    WOUND Performed at [facility], [address] [place], [place] [address]    Special Requests   Final    Normal Performed at [facility], [place], [place] [address]    Gram Stain NO WBC SEEN FEW GRAM NEGATIVE RODS   Final   Culture   Final    MODERATE ESCHERICHIA COLI MODERATE PROTEUS MIRABILIS HOLDING FOR POSSIBLE ANAEROBE Performed at [facility], [address], [place], [place] [address]    Report Status PENDING  Incomplete   Organism ID, Bacteria ESCHERICHIA COLI  Final      Susceptibility   Escherichia coli - MIC*    AMPICILLIN >=32 RESISTANT Resistant     CEFAZOLIN >=64 RESISTANT Resistant     CEFEPIME <=0.12 SENSITIVE Sensitive     CEFTAZIDIME 2 SENSITIVE Sensitive     CEFTRIAXONE 2 INTERMEDIATE Intermediate     CIPROFLOXACIN <=0.25 SENSITIVE Sensitive     GENTAMICIN <=1 SENSITIVE  Sensitive     IMIPENEM <=0.25 SENSITIVE Sensitive     TRIMETH/SULFA <=20 SENSITIVE Sensitive     AMPICILLIN/SULBACTAM 8 SENSITIVE Sensitive     PIP/TAZO <=4 SENSITIVE Sensitive     * MODERATE ESCHERICHIA COLI    Coagulation Studies: No results for input(s): LABPROT, INR in the last 72 hours.  Urinalysis: No results for input(s): COLORURINE, LABSPEC, PHURINE, GLUCOSEU, HGBUR, BILIRUBINUR, KETONESUR, PROTEINUR, UROBILINOGEN, NITRITE, LEUKOCYTESUR in the last 72 hours.  Invalid input(s): APPERANCEUR    Imaging: No results found.   Medications:   . sodium chloride Stopped (11/20/19 2313)  . ceFEPime (MAXIPIME) IV Stopped (11/20/19 2124)  . vancomycin    . vancomycin Stopped (11/20/19 0053)   . atorvastatin  40 mg Oral q1800  . Chlorhexidine Gluconate Cloth  6 each Topical Q0600  . citalopram  10 mg Oral Daily  . collagenase   Topical Daily  . divalproex  125 mg Oral BID  . docusate sodium  100 mg Oral BID  . donepezil  5 mg Oral QHS  . epoetin alfa  4,000 Units Subcutaneous Q T,Th,Sa-HD  . feeding supplement (NEPRO CARB STEADY)  237 mL Oral Q24H  . folic acid  654 mcg Oral Daily  . gabapentin  200 mg Oral QHS  . heparin  5,000 Units Subcutaneous Q8H  . insulin aspart  0-15 Units Subcutaneous TID WC  . insulin aspart  4 Units Subcutaneous TID WC  . labetalol  100 mg Oral BID  . lactulose  10 g Oral Daily  . levothyroxine  100 mcg Oral QAC breakfast  . memantine  5 mg Oral BID  . sodium chloride flush  3 mL Intravenous Q12H   sodium chloride, acetaminophen, benzonatate, meclizine, ondansetron **OR** ondansetron (ZOFRAN) IV, sodium chloride flush  Assessment/ Plan:  80 y.o. female  is a 79 y.o. black female with end stage renal disease on hemodialysis, dementia, diabetes mellitus type II, hypertension, hypothyroidism, osteomyelitis now admitted with altered mental status which is recurrent.  [facility] [address] TTS RIJ [phone].  1.  ESRD on HD  TTS.  Patient due for dialysis treatment again tomorrow.  Orders to be prepared.  2.  Altered mental status.  Could be related to infection.  Remains lethargic but arousable.  3.  Anemia of chronic kidney disease.   Lab Results  Component Value Date   HGB 8.2 (L) 11/21/2019  Epogen 4000 units IV with dialysis.    4.  Secondary hyperparathyroidism.  Patient taken off of Renvela given low phosphorus.  Recheck phosphorus tomorrow.  We will reorder potassium phosphorus 30 mmol IV today.  5.  Hypokalemia.  Phosphorus improved a bit to 3.2.  Potassium phosphate to be administered today.   LOS: 2 Mandrell Vangilder 4/19/20213:09 PM

## 2019-11-22 LAB — AEROBIC/ANAEROBIC CULTURE W GRAM STAIN (SURGICAL/DEEP WOUND)
Gram Stain: NONE SEEN
Special Requests: NORMAL

## 2019-11-22 LAB — BASIC METABOLIC PANEL
Anion gap: 8 (ref 5–15)
BUN: 12 mg/dL (ref 8–23)
CO2: 26 mmol/L (ref 22–32)
Calcium: 7.5 mg/dL — ABNORMAL LOW (ref 8.9–10.3)
Chloride: 98 mmol/L (ref 98–111)
Creatinine, Ser: 1.58 mg/dL — ABNORMAL HIGH (ref 0.44–1.00)
GFR calc Af Amer: 36 mL/min — ABNORMAL LOW (ref >60)
GFR calc non Af Amer: 31 mL/min — ABNORMAL LOW (ref >60)
Glucose, Bld: 79 mg/dL (ref 70–99)
Potassium: 3.9 mmol/L (ref 3.5–5.1)
Sodium: 132 mmol/L — ABNORMAL LOW (ref 135–145)

## 2019-11-22 LAB — GLUCOSE, CAPILLARY
Glucose-Capillary: 94 mg/dL (ref 70–99)
Glucose-Capillary: 81 mg/dL (ref 70–99)
Glucose-Capillary: 77 mg/dL (ref 70–99)

## 2019-11-22 LAB — PARATHYROID HORMONE, INTACT (NO CA): PTH: 46 pg/mL (ref 15–65)

## 2019-11-22 LAB — PHOSPHORUS: Phosphorus: 2 mg/dL — ABNORMAL LOW (ref 2.5–4.6)

## 2019-11-22 MED ORDER — AMOXICILLIN-POT CLAVULANATE 500-125 MG PO TABS
1.0000 | ORAL_TABLET | Freq: Two times a day (BID) | ORAL | Status: DC
  Administered 2019-11-22 – 2019-11-23 (×2): 500 mg via ORAL
  Filled 2019-11-22 (×3): qty 1

## 2019-11-22 MED ORDER — SODIUM CHLORIDE 0.9 % IV SOLN
3.0000 g | Freq: Two times a day (BID) | INTRAVENOUS | Status: DC
  Administered 2019-11-22: 3 g via INTRAVENOUS
  Filled 2019-11-22: qty 3
  Filled 2019-11-22 (×2): qty 8

## 2019-11-22 NOTE — NC FL2 (Signed)
[place] LEVEL OF CARE SCREENING TOOL     IDENTIFICATION  Patient Name: Taylor Tyler Birthdate: [birth year] Sex: female Admission Date (Current Location): 11/19/2019  [place] and [place] Number:  Engineering geologist and Address:  [facility], [address], [place], [place] [address]      Provider Number: [id number]  Attending Physician Name and Address:  Jennye Boroughs, MD  Relative Name and Phone Number:       Current Level of Care: Hospital Recommended Level of Care: [place] Prior Approval Number:    Date Approved/Denied:   PASRR Number: [id number]  Discharge Plan: SNF    Current Diagnoses: Patient Active Problem List   Diagnosis Date Noted  . Wound infection 11/21/2019  . Leukocytosis 11/19/2019  . Acute metabolic encephalopathy 78/46/9629  . Dementia without behavioral disturbance ([place]) 11/03/2019  . History of anemia due to chronic kidney disease 11/03/2019  . ESRD (end stage renal disease) ([place]) 07/22/2019  . Fever in adult   . Pressure injury of skin 06/08/2019  . Acute CHF (congestive heart failure) ([place]) 05/30/2019  . Infection and inflammatory reaction due to internal fixation device of spine, initial encounter ([place]) 06/05/2018  . Neuropathy 01/08/2018  . Fatty liver 01/05/2018  . Chronic pain of both knees 01/05/2018  . Ventral hernia 01/05/2018  . CKD (chronic kidney disease) stage 4, GFR 15-29 ml/min (HCC) 07/21/2017  . Vitamin D deficiency 07/21/2017  . Essential hypertension   . Postoperative wound infection 04/02/2017  . Anemia 03/14/2017  . Acute blood loss anemia 03/14/2017  . Acute renal failure superimposed on stage 3 chronic kidney disease ([place]) 03/14/2017  . Bilateral leg edema 03/04/2017  . Obesity, Class III, BMI 40-49.9 (morbid obesity) ([place]) 02/27/2017  . DM (diabetes mellitus), type 2 with renal complications ([place]) 52/84/1324  . Status post lumbar surgery 02/24/2017  . CKD  (chronic kidney disease) stage 3, GFR 30-59 ml/min 01/15/2017  . Hypothyroidism 01/15/2017  . Diverticulosis 01/15/2017  . HLD (hyperlipidemia) 01/15/2017  . Spinal stenosis of lumbar region 01/15/2017  . Disease of pancreas 07/21/2012    Orientation RESPIRATION BLADDER Height & Weight     Self, Place  Normal Incontinent Weight: 165 lb 5.5 oz (75 kg) Height:  5\' 6"  (167.6 cm)  BEHAVIORAL SYMPTOMS/MOOD NEUROLOGICAL BOWEL NUTRITION STATUS  (None) (Dementia) Incontinent Diet(Carb modified.)  AMBULATORY STATUS COMMUNICATION OF NEEDS Skin     Verbally Other (Comment)(Skin tear. Unstageable pressure injury on right mid sacrum: Gauze. Deep tissue injury on left heel: ABD. Non-pressure injury on medial proximal back: ABD, foam daily.)                       Personal Care Assistance Level of Assistance              Functional Limitations Info  Sight, Hearing, Speech Sight Info: Adequate Hearing Info: Adequate Speech Info: Adequate    SPECIAL CARE FACTORS FREQUENCY                       Contractures Contractures Info: Not present    Additional Factors Info  Code Status, Allergies Code Status Info: Full code Allergies Info: Clams (Shellfish Allergy), Hydralazine, Norvasc (Amlodipine Besylate), Milk-related Compounds           Current Medications (11/22/2019):  This is the current hospital active medication list Current Facility-Administered Medications  Medication Dose Route Frequency Provider Last Rate Last Admin  . 0.9 %  sodium chloride infusion  250 mL Intravenous PRN Collier Bullock, MD   Stopped at 11/21/19 2019  . acetaminophen (TYLENOL) tablet 500 mg  500 mg Oral Daily PRN Agbata, Tochukwu, MD      . Ampicillin-Sulbactam (UNASYN) 3 g in sodium chloride 0.9 % 100 mL IVPB  3 g Intravenous Q12H Jennye Boroughs, MD 200 mL/hr at 11/22/19 1014 3 g at 11/22/19 1014  . atorvastatin (LIPITOR) tablet 40 mg  40 mg Oral q1800 Agbata, Tochukwu, MD   40 mg at 11/21/19  1736  . benzonatate (TESSALON) capsule 100 mg  100 mg Oral TID PRN Agbata, Tochukwu, MD      . Chlorhexidine Gluconate Cloth 2 % PADS 6 each  6 each Topical Q0600 Holley Raring, Munsoor, MD   6 each at 11/21/19 0657  . citalopram (CELEXA) tablet 10 mg  10 mg Oral Daily Agbata, Tochukwu, MD   10 mg at 11/22/19 1011  . collagenase (SANTYL) ointment   Topical Daily Jennye Boroughs, MD   Given at 11/22/19 1011  . divalproex (DEPAKOTE SPRINKLE) capsule 125 mg  125 mg Oral BID Agbata, Tochukwu, MD   125 mg at 11/22/19 1010  . docusate sodium (COLACE) capsule 100 mg  100 mg Oral BID Agbata, Tochukwu, MD   100 mg at 11/22/19 1010  . donepezil (ARICEPT) tablet 5 mg  5 mg Oral QHS Agbata, Tochukwu, MD   5 mg at 11/21/19 2201  . epoetin alfa (EPOGEN) injection 4,000 Units  4,000 Units Subcutaneous Q T,Th,Sa-HD Agbata, Tochukwu, MD   4,000 Units at 11/22/19 1146  . feeding supplement (NEPRO CARB STEADY) liquid 237 mL  237 mL Oral Q24H Agbata, Tochukwu, MD   237 mL at 11/21/19 1540  . folic acid (FOLVITE) tablet 0.5 mg  500 mcg Oral Daily Agbata, Tochukwu, MD   0.5 mg at 11/21/19 1033  . gabapentin (NEURONTIN) capsule 200 mg  200 mg Oral QHS Agbata, Tochukwu, MD   200 mg at 11/21/19 2201  . heparin injection 5,000 Units  5,000 Units Subcutaneous Q8H Agbata, Tochukwu, MD   5,000 Units at 11/22/19 0531  . insulin aspart (novoLOG) injection 0-15 Units  0-15 Units Subcutaneous TID WC Agbata, Tochukwu, MD   2 Units at 11/21/19 1250  . insulin aspart (novoLOG) injection 4 Units  4 Units Subcutaneous TID WC Agbata, Tochukwu, MD      . labetalol (NORMODYNE) tablet 100 mg  100 mg Oral BID Agbata, Tochukwu, MD   100 mg at 11/22/19 1010  . lactulose (CHRONULAC) 10 GM/15ML solution 10 g  10 g Oral Daily Agbata, Tochukwu, MD   10 g at 11/22/19 1011  . levothyroxine (SYNTHROID) tablet 100 mcg  100 mcg Oral QAC breakfast Agbata, Tochukwu, MD   100 mcg at 11/22/19 0531  . meclizine (ANTIVERT) tablet 12.5 mg  12.5 mg Oral Daily PRN  Agbata, Tochukwu, MD      . memantine (NAMENDA) tablet 5 mg  5 mg Oral BID Agbata, Tochukwu, MD   5 mg at 11/22/19 1024  . ondansetron (ZOFRAN) tablet 4 mg  4 mg Oral Q6H PRN Agbata, Tochukwu, MD       Or  . ondansetron (ZOFRAN) injection 4 mg  4 mg Intravenous Q6H PRN Agbata, Tochukwu, MD      . sodium chloride flush (NS) 0.9 % injection 3 mL  3 mL Intravenous Q12H Agbata, Tochukwu, MD   3 mL at 11/22/19 1012  . sodium chloride flush (NS) 0.9 % injection 3 mL  3 mL  Intravenous PRN Agbata, Tochukwu, MD      . vancomycin (VANCOREADY) IVPB 1750 mg/350 mL  1,750 mg Intravenous Once Agbata, Tochukwu, MD         Discharge Medications: Please see discharge summary for a list of discharge medications.  Relevant Imaging Results:  Relevant Lab Results:   Additional Information SS#: [id number]. HD [place] TTS 9:20am. On low-air loss mattress.  Candie Chroman, LCSW

## 2019-11-22 NOTE — Progress Notes (Signed)
This note also relates to the following rows which could not be included: Pulse Rate - Cannot attach notes to unvalidated device data Resp - Cannot attach notes to unvalidated device data BP - Cannot attach notes to unvalidated device data  Hd completed  

## 2019-11-22 NOTE — TOC Initial Note (Signed)
Transition of Care [facility]) - Initial/Assessment Note    Patient Details  Name: Taylor Tyler MRN: [id number] Date of Birth: 1940-10-20  [facility] CM/SW Contact:    Candie Chroman, LCSW Phone Number: 11/22/2019, 12:29 PM  Clinical Narrative:  Patient not fully oriented. CSW called patient's son, Taylor Tyler, introduced role, and explained that discharge planning would be discussed. Patient's son confirmed she is a long-term resident at [facility] and plan is for her to return at discharge. No further concerns. CSW encouraged patient's son to contact CSW as needed. CSW will continue to follow patient and her son for support and facilitate return to SNF when stable.                Expected Discharge Plan: Skilled Nursing Facility Barriers to Discharge: Continued Medical Work up   Patient Goals and CMS Choice Patient states their goals for this hospitalization and ongoing recovery are:: Patient not fully oriented.   Choice offered to / list presented to : NA  Expected Discharge Plan and Services Expected Discharge Plan: [place] Acute Care Choice: Resumption of Svcs/PTA Provider Living arrangements for the past 2 months: [place]                                      Prior Living Arrangements/Services Living arrangements for the past 2 months: [place] Lives with:: Facility Resident Patient language and need for interpreter reviewed:: Yes Do you feel safe going back to the place where you live?: Yes      Need for Family Participation in Patient Care: Yes (Comment) Care giver support system in place?: Yes (comment)   Criminal Activity/Legal Involvement Pertinent to Current Situation/Hospitalization: No - Comment as needed  Activities of Daily Living      Permission Sought/Granted Permission sought to share information with : Facility Sport and exercise psychologist, Family Supports    Share Information  with NAME: Taylor Tyler  Permission granted to share info w AGENCY: [place] granted to share info w Relationship: Son  Permission granted to share info w Contact Information: [phone]  Emotional Assessment Appearance:: Appears stated age Attitude/Demeanor/Rapport: Unable to Assess Affect (typically observed): Unable to Assess Orientation: : Oriented to Self, Oriented to Place Alcohol / Substance Use: Not Applicable Psych Involvement: No (comment)  Admission diagnosis:  Acute metabolic encephalopathy [W29.56] Patient Active Problem List   Diagnosis Date Noted  . Wound infection 11/21/2019  . Leukocytosis 11/19/2019  . Acute metabolic encephalopathy 21/30/8657  . Dementia without behavioral disturbance ([place]) 11/03/2019  . History of anemia due to chronic kidney disease 11/03/2019  . ESRD (end stage renal disease) ([place]) 07/22/2019  . Fever in adult   . Pressure injury of skin 06/08/2019  . Acute CHF (congestive heart failure) ([place]) 05/30/2019  . Infection and inflammatory reaction due to internal fixation device of spine, initial encounter ([place]) 06/05/2018  . Neuropathy 01/08/2018  . Fatty liver 01/05/2018  . Chronic pain of both knees 01/05/2018  . Ventral hernia 01/05/2018  . CKD (chronic kidney disease) stage 4, GFR 15-29 ml/min (HCC) 07/21/2017  . Vitamin D deficiency 07/21/2017  . Essential hypertension   . Postoperative wound infection 04/02/2017  . Anemia 03/14/2017  . Acute blood loss anemia 03/14/2017  . Acute renal failure superimposed on stage 3 chronic kidney disease ([place]) 03/14/2017  .  Bilateral leg edema 03/04/2017  . Obesity, Class III, BMI 40-49.9 (morbid obesity) ([place]) 02/27/2017  . DM (diabetes mellitus), type 2 with renal complications ([place]) 25/27/1292  . Status post lumbar surgery 02/24/2017  . CKD (chronic kidney disease) stage 3, GFR 30-59 ml/min 01/15/2017  . Hypothyroidism 01/15/2017  . Diverticulosis 01/15/2017  . HLD  (hyperlipidemia) 01/15/2017  . Spinal stenosis of lumbar region 01/15/2017  . Disease of pancreas 07/21/2012   PCP:  McLean-Scocuzza, Nino Glow, MD Pharmacy:   [facility] [facility], [place] [facility] [address] [place] [address] Phone: [phone] Fax: [phone]     Social Determinants of Health (SDOH) Interventions    Readmission Risk Interventions No flowsheet data found.

## 2019-11-22 NOTE — Progress Notes (Signed)
[place] [facility]  ROUNDING NOTE   Subjective:  Patient due for dialysis treatment today. Remains quite lethargic but is arousable. She is maintained on decubitus ulcer treatment with antibiotics.  Objective:  Vital signs in last 24 hours:  Temp:  [97.8 F (36.6 C)-99.5 F (37.5 C)] 97.8 F (36.6 C) (04/20 1120) Pulse Rate:  [59-71] 63 (04/20 1200) Resp:  [15-20] 16 (04/20 1200) BP: (106-131)/(38-57) 123/57 (04/20 1145) SpO2:  [98 %-100 %] 98 % (04/20 1120)  Weight change:  Filed Weights   11/19/19 1058  Weight: 75 kg    Intake/Output: I/O last 3 completed shifts: In: 1578.8 [P.O.:997; I.V.:27.3; IV Piggyback:554.4] Out: 0    Intake/Output this shift:  No intake/output data recorded.  Physical Exam: General: No acute distress  Head: Normocephalic, atraumatic. Moist oral mucosal membranes  Eyes: Anicteric  Neck: Supple, trachea midline  Lungs:  Clear to auscultation, normal effort  Heart: S1S2 no rubs  Abdomen:  Soft, nontender, bowel sounds present  Extremities: Trace peripheral edema.  Neurologic: Lethargic but arousable   Skin: Warm/dry  Access: Right IJ PermCath    Basic Metabolic Panel: Recent Labs  Lab 11/19/19 1221 11/19/19 2121 11/20/19 0504 11/21/19 0826  NA 132*  --  136 133*  K 3.4*  --  3.0* 3.2*  CL 99  --  100 100  CO2 26  --  30 25  GLUCOSE 100*  --  74 121*  BUN 23  --  11 21  CREATININE 3.10*  --  1.86* 2.68*  CALCIUM 8.4*  --  7.9* 8.1*  PHOS  --  <1.0*  --  1.6*    Liver Function Tests: Recent Labs  Lab 11/19/19 1221  AST 15  ALT 10  ALKPHOS 87  BILITOT 0.5  PROT 5.8*  ALBUMIN 1.5*   No results for input(s): LIPASE, AMYLASE in the last 168 hours. No results for input(s): AMMONIA in the last 168 hours.  CBC: Recent Labs  Lab 11/19/19 1111 11/20/19 0504 11/21/19 0826  WBC 21.0* 16.4* 10.0  NEUTROABS  --   --  7.4  HGB 8.6* 8.4* 8.2*  HCT 28.7* 29.3* 28.3*  MCV 90.5 93.0 92.8  PLT 141* 125* 123*     Cardiac Enzymes: No results for input(s): CKTOTAL, CKMB, CKMBINDEX, TROPONINI in the last 168 hours.  BNP: Invalid input(s): POCBNP  CBG: Recent Labs  Lab 11/21/19 0747 11/21/19 1141 11/21/19 1711 11/21/19 2201 11/22/19 0817  GLUCAP 111* 126* 100* 100* 94    Microbiology: Results for orders placed or performed during the hospital encounter of 11/19/19  Blood culture (routine x 2)     Status: None (Preliminary result)   Collection Time: 11/19/19 12:22 PM   Specimen: BLOOD  Result Value Ref Range Status   Specimen Description BLOOD LAC  Final   Special Requests   Final    BOTTLES DRAWN AEROBIC AND ANAEROBIC Blood Culture adequate volume   Culture   Final    NO GROWTH 3 DAYS Performed at [facility], [address] [place], [place] [address]    Report Status PENDING  Incomplete  Blood culture (routine x 2)     Status: None (Preliminary result)   Collection Time: 11/19/19 12:32 PM   Specimen: BLOOD  Result Value Ref Range Status   Specimen Description BLOOD BLOOD LEFT HAND  Final   Special Requests   Final    BOTTLES DRAWN AEROBIC ONLY Blood Culture results may not be optimal due to an inadequate volume  of blood received in culture bottles   Culture   Final    NO GROWTH 3 DAYS Performed at [facility], [address] [place], [place] [address]    Report Status PENDING  Incomplete  SARS CORONAVIRUS 2 (TAT 6-24 HRS) Nasopharyngeal Nasopharyngeal Swab     Status: None   Collection Time: 11/19/19  4:11 PM   Specimen: Nasopharyngeal Swab  Result Value Ref Range Status   SARS Coronavirus 2 NEGATIVE NEGATIVE Final    Comment: (NOTE) SARS-CoV-2 target nucleic acids are NOT DETECTED. The SARS-CoV-2 RNA is generally detectable in upper and lower respiratory specimens during the acute phase of infection. Negative results do not preclude SARS-CoV-2 infection, do not rule out co-infections with other pathogens, and should not be used as the sole  basis for treatment or other patient management decisions. Negative results must be combined with clinical observations, patient history, and epidemiological information. The expected result is Negative. Fact Sheet for Patients: SugarRoll.be Fact Sheet for Healthcare Providers: https://www.woods-mathews.com/ This test is not yet approved or cleared by the [place] FDA and  has been authorized for detection and/or diagnosis of SARS-CoV-2 by FDA under an Emergency Use Authorization (EUA). This EUA will remain  in effect (meaning this test can be used) for the duration of the COVID-19 declaration under Section 56 4(b)(1) of the Act, 21 U.S.C. section 360bbb-3(b)(1), unless the authorization is terminated or revoked sooner. Performed at [facility], [place] [address], [place], [place] [address]   Aerobic/Anaerobic Culture (surgical/deep wound)     Status: None (Preliminary result)   Collection Time: 11/19/19  6:04 PM   Specimen: Wound  Result Value Ref Range Status   Specimen Description   Final    WOUND Performed at [facility], [address], [place], [place] [address]    Special Requests   Final    Normal Performed at [facility], [place], [place] [address]    Gram Stain NO WBC SEEN FEW GRAM NEGATIVE RODS   Final   Culture   Final    MODERATE ESCHERICHIA COLI MODERATE PROTEUS MIRABILIS HOLDING FOR POSSIBLE ANAEROBE Performed at [facility], [address], [place], [place] [address]    Report Status PENDING  Incomplete   Organism ID, Bacteria ESCHERICHIA COLI  Final   Organism ID, Bacteria PROTEUS MIRABILIS  Final      Susceptibility   Escherichia coli - MIC*    AMPICILLIN >=32 RESISTANT Resistant     CEFAZOLIN >=64 RESISTANT Resistant     CEFEPIME <=0.12 SENSITIVE Sensitive     CEFTAZIDIME 2 SENSITIVE Sensitive     CEFTRIAXONE 2 INTERMEDIATE Intermediate     CIPROFLOXACIN  <=0.25 SENSITIVE Sensitive     GENTAMICIN <=1 SENSITIVE Sensitive     IMIPENEM <=0.25 SENSITIVE Sensitive     TRIMETH/SULFA <=20 SENSITIVE Sensitive     AMPICILLIN/SULBACTAM 8 SENSITIVE Sensitive     PIP/TAZO <=4 SENSITIVE Sensitive     * MODERATE ESCHERICHIA COLI   Proteus mirabilis - MIC*    AMPICILLIN <=2 SENSITIVE Sensitive     CEFAZOLIN <=4 SENSITIVE Sensitive     CEFEPIME <=0.12 SENSITIVE Sensitive     CEFTAZIDIME <=1 SENSITIVE Sensitive     CEFTRIAXONE <=0.25 SENSITIVE Sensitive     CIPROFLOXACIN <=0.25 SENSITIVE Sensitive     GENTAMICIN <=1 SENSITIVE Sensitive     IMIPENEM 8 INTERMEDIATE Intermediate     TRIMETH/SULFA <=20 SENSITIVE Sensitive     AMPICILLIN/SULBACTAM <=2  SENSITIVE Sensitive     PIP/TAZO <=4 SENSITIVE Sensitive     * MODERATE PROTEUS MIRABILIS    Coagulation Studies: No results for input(s): LABPROT, INR in the last 72 hours.  Urinalysis: No results for input(s): COLORURINE, LABSPEC, PHURINE, GLUCOSEU, HGBUR, BILIRUBINUR, KETONESUR, PROTEINUR, UROBILINOGEN, NITRITE, LEUKOCYTESUR in the last 72 hours.  Invalid input(s): APPERANCEUR    Imaging: No results found.   Medications:   . sodium chloride Stopped (11/21/19 2019)  . ampicillin-sulbactam (UNASYN) IV 3 g (11/22/19 1014)  . vancomycin     . atorvastatin  40 mg Oral q1800  . Chlorhexidine Gluconate Cloth  6 each Topical Q0600  . citalopram  10 mg Oral Daily  . collagenase   Topical Daily  . divalproex  125 mg Oral BID  . docusate sodium  100 mg Oral BID  . donepezil  5 mg Oral QHS  . epoetin alfa  4,000 Units Subcutaneous Q T,Th,Sa-HD  . feeding supplement (NEPRO CARB STEADY)  237 mL Oral Q24H  . folic acid  191 mcg Oral Daily  . gabapentin  200 mg Oral QHS  . heparin  5,000 Units Subcutaneous Q8H  . insulin aspart  0-15 Units Subcutaneous TID WC  . insulin aspart  4 Units Subcutaneous TID WC  . labetalol  100 mg Oral BID  . lactulose  10 g Oral Daily  . levothyroxine  100 mcg Oral QAC  breakfast  . memantine  5 mg Oral BID  . sodium chloride flush  3 mL Intravenous Q12H   sodium chloride, acetaminophen, benzonatate, meclizine, ondansetron **OR** ondansetron (ZOFRAN) IV, sodium chloride flush  Assessment/ Plan:  79 y.o. female  is a 79 y.o. black female with end stage renal disease on hemodialysis, dementia, diabetes mellitus type II, hypertension, hypothyroidism, osteomyelitis now admitted with altered mental status which is recurrent.  Taylor [facility] ([place]) [facility] [address] TTS RIJ [phone].  1.  ESRD on HD TTS.  Patient due for dialysis treatment today.  Orders have been prepared.  2.  Altered mental status.  Overall patient remains quite lethargic.  She is arousable.  3.  Anemia of chronic Tyler disease.   Lab Results  Component Value Date   HGB 8.2 (L) 11/21/2019  Administer Epogen 4000 IV with dialysis today.  4.  Secondary hyperparathyroidism.  Most recent serum phosphorus was 1.6.  Continue to periodically monitor.  5.  Hypokalemia.  Adjust potassium bath to 4.0.   LOS: 3 Shaeley Segall 4/20/202112:05 PM

## 2019-11-22 NOTE — Progress Notes (Addendum)
Progress Note    Taylor Tyler  RDE:[id number] DOB: 06/17/1941  DOA: 11/19/2019 PCP: McLean-Scocuzza, Nino Glow, MD      Brief Narrative:    Medical records reviewed and are as summarized below:  Taylor Tyler is an 79 y.o. female with medical history significant forend-stage renal disease on dialysis(T/TH/S),Covid + inDecember 2020,  repeat Covid test was negative on 11/03/19,history of dementia and diabetes mellitus who was sent to the emergency room for evaluation of mental status changes.  Patient was dropped off for her dialysis on 11/19/2019 and when the dialysis nurse went to assess her, the patient was found unresponsive .She was sent to the emergency room for further evaluation .   She has a chronic wound on her lower back that was infected.  This was thought to be the source of infection.  She was treated with empiric IV antibiotics.  Had leukocytosis on admission but this has improved with antibiotics.  She was seen in consultation by the wound care nurse for local wound care.  Patient also had metabolic encephalopathy on admission but this has improved.  She was evaluated by the nephrologist because she has end-stage renal disease and she underwent hemodialysis.  She had hypokalemia and hypophosphatemia that were repleted as well.     Assessment/Plan:   Principal Problem:   Acute metabolic encephalopathy Active Problems:   Hypothyroidism   Obesity, Class III, BMI 40-49.9 (morbid obesity) (HCC)   ESRD (end stage renal disease) (HCC)   Dementia without behavioral disturbance (HCC)   Leukocytosis   Wound infection   Acute toxic metabolic encephalopathy: Improved.  Wound infection on lower back: Leukocytosis has resolved.  Wound culture showed E. coli and Proteus mirabilis. switch Unasyn to Augmentin   Hypokalemia and hypophosphatemia: Patient got IV potassium phosphate on 11/21/2019.  Monitor potassium and phosphorus levels.  Deferred to nephrologist for further  management  End-stage renal disease on hemodialysis Follow-up with nephrologist for hemodialysis.  Anemia of chronic kidney disease  Stable  Unstageable coccygeal decubitus ulcer: Present on admission.  Appreciate assistance from wound care nurse.  Continue local wound care.  Body mass index is 26.69 kg/m.         Family Communication/Anticipated D/C date and plan/Code Status   DVT prophylaxis: Heparin Code Status: Full code Family Communication: Plan discussed with patient Disposition Plan:    Status is: Inpatient  Remains inpatient appropriate because:Inpatient level of care appropriate due to severity of illness   Dispo: The patient is from: SNF              Anticipated d/c is to: SNF              Anticipated d/c date is: 1 day              Patient currently is not medically stable to d/c.            Subjective:   She does not provide much history today.  She is more sleepy/lethargic  Objective:    Vitals:   11/22/19 1345 11/22/19 1400 11/22/19 1415 11/22/19 1430  BP: (!) 90/46 (!) 107/52 (!) 98/48   Pulse: 61 (!) 55 (!) 57   Resp: 17 14 14    Temp:    (!) (P) 97.4 F (36.3 C)  TempSrc:    (P) Oral  SpO2:      Weight:      Height:        Intake/Output Summary (Last 24 hours)  at 11/22/2019 1606 Last data filed at 11/22/2019 1430 Gross per 24 hour  Intake 403.45 ml  Output 500 ml  Net -96.55 ml   Filed Weights   11/19/19 1058  Weight: 75 kg    Exam:  GEN: No acute distress SKIN: Unstageable sacral decubitus ulcer, left heel deep tissue injury EYES: EOMI ENT: MMM CV: RRR PULM: No wheezing or rales heard ABD: soft, ND, NT, +BS CNS: She was lethargic/sleepy at the time of my visit and could not follow commands.   EXT: No edema or tenderness   Data Reviewed:   I have personally reviewed following labs and imaging studies:  Labs: Labs show the following:   Basic Metabolic Panel: Recent Labs  Lab 11/19/19 1221  11/19/19 1221 11/19/19 2121 11/20/19 0504 11/21/19 0826  NA 132*  --   --  136 133*  K 3.4*   < >  --  3.0* 3.2*  CL 99  --   --  100 100  CO2 26  --   --  30 25  GLUCOSE 100*  --   --  74 121*  BUN 23  --   --  11 21  CREATININE 3.10*  --   --  1.86* 2.68*  CALCIUM 8.4*  --   --  7.9* 8.1*  PHOS  --   --  <1.0*  --  1.6*   < > = values in this interval not displayed.   GFR Estimated Creatinine Clearance: 18.5 mL/min (A) (by C-G formula based on SCr of 2.68 mg/dL (H)). Liver Function Tests: Recent Labs  Lab 11/19/19 1221  AST 15  ALT 10  ALKPHOS 87  BILITOT 0.5  PROT 5.8*  ALBUMIN 1.5*   No results for input(s): LIPASE, AMYLASE in the last 168 hours. No results for input(s): AMMONIA in the last 168 hours. Coagulation profile No results for input(s): INR, PROTIME in the last 168 hours.  CBC: Recent Labs  Lab 11/19/19 1111 11/20/19 0504 11/21/19 0826  WBC 21.0* 16.4* 10.0  NEUTROABS  --   --  7.4  HGB 8.6* 8.4* 8.2*  HCT 28.7* 29.3* 28.3*  MCV 90.5 93.0 92.8  PLT 141* 125* 123*   Cardiac Enzymes: No results for input(s): CKTOTAL, CKMB, CKMBINDEX, TROPONINI in the last 168 hours. BNP (last 3 results) No results for input(s): PROBNP in the last 8760 hours. CBG: Recent Labs  Lab 11/21/19 0747 11/21/19 1141 11/21/19 1711 11/21/19 2201 11/22/19 0817  GLUCAP 111* 126* 100* 100* 94   D-Dimer: No results for input(s): DDIMER in the last 72 hours. Hgb A1c: No results for input(s): HGBA1C in the last 72 hours. Lipid Profile: No results for input(s): CHOL, HDL, LDLCALC, TRIG, CHOLHDL, LDLDIRECT in the last 72 hours. Thyroid function studies: No results for input(s): TSH, T4TOTAL, T3FREE, THYROIDAB in the last 72 hours.  Invalid input(s): FREET3 Anemia work up: No results for input(s): VITAMINB12, FOLATE, FERRITIN, TIBC, IRON, RETICCTPCT in the last 72 hours. Sepsis Labs: Recent Labs  Lab 11/19/19 1111 11/19/19 1221 11/20/19 0504 11/21/19 0826  WBC  21.0*  --  16.4* 10.0  LATICACIDVEN  --  1.4  --   --     Microbiology Recent Results (from the past 240 hour(s))  Blood culture (routine x 2)     Status: None (Preliminary result)   Collection Time: 11/19/19 12:22 PM   Specimen: BLOOD  Result Value Ref Range Status   Specimen Description BLOOD LAC  Final   Special Requests  Final    BOTTLES DRAWN AEROBIC AND ANAEROBIC Blood Culture adequate volume   Culture   Final    NO GROWTH 3 DAYS Performed at [facility], [address] [place], [place] [address]    Report Status PENDING  Incomplete  Blood culture (routine x 2)     Status: None (Preliminary result)   Collection Time: 11/19/19 12:32 PM   Specimen: BLOOD  Result Value Ref Range Status   Specimen Description BLOOD BLOOD LEFT HAND  Final   Special Requests   Final    BOTTLES DRAWN AEROBIC ONLY Blood Culture results may not be optimal due to an inadequate volume of blood received in culture bottles   Culture   Final    NO GROWTH 3 DAYS Performed at [facility], [address] [place], [place] [address]    Report Status PENDING  Incomplete  SARS CORONAVIRUS 2 (TAT 6-24 HRS) Nasopharyngeal Nasopharyngeal Swab     Status: None   Collection Time: 11/19/19  4:11 PM   Specimen: Nasopharyngeal Swab  Result Value Ref Range Status   SARS Coronavirus 2 NEGATIVE NEGATIVE Final    Comment: (NOTE) SARS-CoV-2 target nucleic acids are NOT DETECTED. The SARS-CoV-2 RNA is generally detectable in upper and lower respiratory specimens during the acute phase of infection. Negative results do not preclude SARS-CoV-2 infection, do not rule out co-infections with other pathogens, and should not be used as the sole basis for treatment or other patient management decisions. Negative results must be combined with clinical observations, patient history, and epidemiological information. The expected result is Negative. Fact Sheet for  Patients: SugarRoll.be Fact Sheet for Healthcare Providers: https://www.woods-mathews.com/ This test is not yet approved or cleared by the [place] FDA and  has been authorized for detection and/or diagnosis of SARS-CoV-2 by FDA under an Emergency Use Authorization (EUA). This EUA will remain  in effect (meaning this test can be used) for the duration of the COVID-19 declaration under Section 56 4(b)(1) of the Act, 21 U.S.C. section 360bbb-3(b)(1), unless the authorization is terminated or revoked sooner. Performed at [facility], [place] [address], [place], [place] [address]   Aerobic/Anaerobic Culture (surgical/deep wound)     Status: None   Collection Time: 11/19/19  6:04 PM   Specimen: Wound  Result Value Ref Range Status   Specimen Description   Final    WOUND Performed at [facility], [address], [place], [place] [address]    Special Requests   Final    Normal Performed at [place], [place] [address]    Gram Stain NO WBC SEEN FEW GRAM NEGATIVE RODS   Final   Culture   Final    MODERATE ESCHERICHIA COLI MODERATE PROTEUS MIRABILIS ABUNDANT BACTEROIDES OVATUS BETA LACTAMASE POSITIVE Performed at [facility], [place] [address], [place], [place] [address]    Report Status 11/22/2019 FINAL  Final   Organism ID, Bacteria ESCHERICHIA COLI  Final   Organism ID, Bacteria PROTEUS MIRABILIS  Final      Susceptibility   Escherichia coli - MIC*    AMPICILLIN >=32 RESISTANT Resistant     CEFAZOLIN >=64 RESISTANT Resistant     CEFEPIME <=0.12 SENSITIVE Sensitive     CEFTAZIDIME 2 SENSITIVE Sensitive     CEFTRIAXONE 2 INTERMEDIATE Intermediate     CIPROFLOXACIN <=0.25 SENSITIVE Sensitive     GENTAMICIN <=1 SENSITIVE Sensitive     IMIPENEM <=0.25 SENSITIVE Sensitive  TRIMETH/SULFA <=20 SENSITIVE Sensitive     AMPICILLIN/SULBACTAM 8 SENSITIVE Sensitive     PIP/TAZO <=4  SENSITIVE Sensitive     * MODERATE ESCHERICHIA COLI   Proteus mirabilis - MIC*    AMPICILLIN <=2 SENSITIVE Sensitive     CEFAZOLIN <=4 SENSITIVE Sensitive     CEFEPIME <=0.12 SENSITIVE Sensitive     CEFTAZIDIME <=1 SENSITIVE Sensitive     CEFTRIAXONE <=0.25 SENSITIVE Sensitive     CIPROFLOXACIN <=0.25 SENSITIVE Sensitive     GENTAMICIN <=1 SENSITIVE Sensitive     IMIPENEM 8 INTERMEDIATE Intermediate     TRIMETH/SULFA <=20 SENSITIVE Sensitive     AMPICILLIN/SULBACTAM <=2 SENSITIVE Sensitive     PIP/TAZO <=4 SENSITIVE Sensitive     * MODERATE PROTEUS MIRABILIS    Procedures and diagnostic studies:  No results found.  Medications:   . amoxicillin-clavulanate  1 tablet Oral Q12H  . atorvastatin  40 mg Oral q1800  . Chlorhexidine Gluconate Cloth  6 each Topical Q0600  . citalopram  10 mg Oral Daily  . collagenase   Topical Daily  . divalproex  125 mg Oral BID  . docusate sodium  100 mg Oral BID  . donepezil  5 mg Oral QHS  . epoetin alfa  4,000 Units Subcutaneous Q T,Th,Sa-HD  . feeding supplement (NEPRO CARB STEADY)  237 mL Oral Q24H  . folic acid  616 mcg Oral Daily  . gabapentin  200 mg Oral QHS  . heparin  5,000 Units Subcutaneous Q8H  . insulin aspart  0-15 Units Subcutaneous TID WC  . insulin aspart  4 Units Subcutaneous TID WC  . labetalol  100 mg Oral BID  . lactulose  10 g Oral Daily  . levothyroxine  100 mcg Oral QAC breakfast  . memantine  5 mg Oral BID  . sodium chloride flush  3 mL Intravenous Q12H   Continuous Infusions: . sodium chloride Stopped (11/21/19 2019)  . vancomycin       LOS: 3 days   Chiante Peden  [facility]     11/22/2019, 4:06 PM

## 2019-11-22 NOTE — Care Management Important Message (Signed)
Important Message  Patient Details  Name: Taylor Tyler MRN: [id number] Date of Birth: 1940/09/24   Medicare Important Message Given:  Yes     Dannette Barbara 11/22/2019, 11:10 AM

## 2019-11-22 NOTE — Progress Notes (Signed)
This note also relates to the following rows which could not be included: Pulse Rate - Cannot attach notes to unvalidated device data Resp - Cannot attach notes to unvalidated device data  Hd started  

## 2019-11-23 DIAGNOSIS — G9341 Metabolic encephalopathy: Secondary | ICD-10-CM

## 2019-11-23 DIAGNOSIS — N186 End stage renal disease: Secondary | ICD-10-CM

## 2019-11-23 DIAGNOSIS — T148XXA Other injury of unspecified body region, initial encounter: Secondary | ICD-10-CM

## 2019-11-23 DIAGNOSIS — L089 Local infection of the skin and subcutaneous tissue, unspecified: Secondary | ICD-10-CM

## 2019-11-23 LAB — GLUCOSE, CAPILLARY
Glucose-Capillary: 88 mg/dL (ref 70–99)
Glucose-Capillary: 96 mg/dL (ref 70–99)

## 2019-11-23 MED ORDER — AMOXICILLIN-POT CLAVULANATE 500-125 MG PO TABS: 1.0000 | ORAL_TABLET | Freq: Two times a day (BID) | ORAL | 0 refills | Status: AC

## 2019-11-23 MED ORDER — COLLAGENASE 250 UNIT/GM EX OINT: TOPICAL_OINTMENT | Freq: Every day | CUTANEOUS | 0 refills | Status: AC

## 2019-11-23 NOTE — Progress Notes (Signed)
VAST consult to obtain IV access. Called unit and spoke with pt's nurse, Cory Roughen. Advised that pt no longer has IV meds or fluids ordered. Cory Roughen reviewed [facility] and stated medication was changed to PO route; he will contact physician to obtain order to leave IV access out for now.

## 2019-11-23 NOTE — Discharge Summary (Addendum)
Physician Discharge Summary  Taylor Tyler GHW:[id number] DOB: 15-Aug-1940 DOA: 11/19/2019  PCP: McLean-Scocuzza, Nino Glow, MD  Admit date: 11/19/2019 Discharge date: 11/23/2019  Admitted From: [name] Disposition:  [facility]  Recommendations for Outpatient Follow-up:  Follow up with PCP in 1-2 weeks Please obtain BMP/CBC in one week Please follow up on patient's low back and coccyx wounds.  Care instructions outlined below.    Home Health: No  Equipment/Devices: None   Discharge Condition: Stable  CODE STATUS: Full  Diet recommendation: Carb Modified   Brief/Interim Summary:  Taylor Tyler is an 79 y.o. female with medical history significant for end-stage renal disease on dialysis (T/TH/S), Covid + in December 2020,  repeat Covid test was negative on 11/03/19, history of dementia and diabetes mellitus who was sent to the emergency room for evaluation of mental status changes.  Patient was dropped off for her dialysis on 11/19/2019 and when the dialysis nurse went to assess her, the patient was found unresponsive .She was sent to the emergency room for further evaluation.  She has a chronic wound on her lower back that was found to be infected.  he was treated with empiric IV antibiotics which were de-escalated to Augmentin based on wound culture results.  She was seen in consultation by the wound care nurse for local wound care and instructions provided as outlined below.  Patient also had metabolic encephalopathy on admission but this has improved with treatment of the underlying infection.     Nephrologist was consulted for end-stage renal disease and hemodialysis during admission.    Acute toxic metabolic encephalopathy: Improved.   Lumbar wound infection (chronic nonhealing lumbar surgical wound present on admission):  Leukocytosis has resolved.  Wound culture showed E. coli and Proteus mirabilis. Continue Augmentin to complete course of therapy.  Monitor wound for improvement  and local wound care as outlined below.  Unstageable coccygeal decubitus ulcer: Present on admission.  Appreciate assistance from wound care nurse.  Continue local wound care.   Pressure Injury 11/19/19 Sacrum Mid;Right Unstageable - Full thickness tissue loss in which the base of the injury is covered by slough (yellow, tan, gray, green or brown) and/or eschar (tan, brown or black) in the wound bed. deep wound with bed covered  (Active)  11/19/19 1800  Location: Sacrum  Location Orientation: Mid;Right  Staging: Unstageable - Full thickness tissue loss in which the base of the injury is covered by slough (yellow, tan, gray, green or brown) and/or eschar (tan, brown or black) in the wound bed.  Wound Description (Comments): deep wound with bed covered by slough  Present on Admission: Yes     Pressure Injury 11/19/19 Heel Left Deep Tissue Pressure Injury - Purple or maroon localized area of discolored intact skin or blood-filled blister due to damage of underlying soft tissue from pressure and/or shear. (Active)  11/19/19 1800  Location: Heel  Location Orientation: Left  Staging: Deep Tissue Pressure Injury - Purple or maroon localized area of discolored intact skin or blood-filled blister due to damage of underlying soft tissue from pressure and/or shear.  Wound Description (Comments):   Present on Admission: Yes     Hypokalemia and hypophosphatemia: Patient got IV potassium phosphate on 11/21/2019.  Monitor potassium and phosphorus levels.  Deferred to nephrologist for further management    End-stage renal disease on hemodialysis Follow-up with nephrologist for hemodialysis.   Anemia of chronic kidney disease : Stable.  Monitor CBC   Body mass index is 26.69  kg/m.   Discharge Diagnoses: Principal Problem:   Acute metabolic encephalopathy Active Problems:   Hypothyroidism   Obesity, Class III, BMI 40-49.9 (morbid obesity) (HCC)   ESRD (end stage renal disease) (HCC)   Dementia  without behavioral disturbance (HCC)   Leukocytosis   Wound infection    Discharge Instructions   Discharge Instructions     Call MD for:  extreme fatigue   Complete by: As directed    Call MD for:  severe uncontrolled pain   Complete by: As directed    Call MD for:  temperature >100.4   Complete by: As directed    Diet - low sodium heart healthy   Complete by: As directed    Discharge instructions   Complete by: As directed    Take Augmenin for 6 more days to complete treatment for infected lower back wound.    Dressing procedure/placement/frequency:   --Cleanse LUMBAR wound with NS and pat dry.  --Fill wound with Aquacel .  Use entire 4x4 sheet due to drainage.  --Top with dry gauze and ABD pad and tape. Change daily and PRN soilage.   --Cleanse COCCYX wound with NS and pat dry.   --Apply Santyl to wound bed.  Cover with NS moist gauze.   --Secure with sacral foam dressing. Change daily.  --Barrier cream to buttocks and perineal skin twice daily and PRN soilage.   Increase activity slowly   Complete by: As directed       Allergies as of 11/23/2019       Reactions   Clams [shellfish Allergy] Swelling, Other (See Comments)   THROAT SWELLS NECK TURNS RED   Hydralazine Other (See Comments)   CHEST TIGHTNESS Patient has tolerated multiple doses of hydralazine since allergy was listed    Norvasc [amlodipine Besylate] Swelling   Leg edema    Milk-related Compounds Diarrhea        Medication List     STOP taking these medications    amoxicillin-clavulanate 250-62.5 MG/5ML suspension Commonly known as: AUGMENTIN Replaced by: amoxicillin-clavulanate 500-125 MG tablet       TAKE these medications    acetaminophen 500 MG tablet Commonly known as: TYLENOL Take 500 mg by mouth daily as needed for mild pain.   ACIDOPHILUS PO Take 175 mg by mouth 2 (two) times daily.   amoxicillin-clavulanate 500-125 MG tablet Commonly known as: AUGMENTIN Take 1 tablet  (500 mg total) by mouth every 12 (twelve) hours for 6 days. Replaces: amoxicillin-clavulanate 250-62.5 MG/5ML suspension   atorvastatin 40 MG tablet Commonly known as: LIPITOR Take 1 tablet (40 mg total) by mouth daily at 6 PM.   benzonatate 100 MG capsule Commonly known as: Tessalon Perles Take 1 capsule (100 mg total) by mouth 3 (three) times daily as needed for cough.   citalopram 10 MG tablet Commonly known as: CELEXA Take 10 mg by mouth daily.   collagenase ointment Commonly known as: SANTYL Apply topically daily. Start taking on: November 24, 2019   divalproex 125 MG capsule Commonly known as: DEPAKOTE SPRINKLE Take 125 mg by mouth 2 (two) times daily.   docusate sodium 100 MG capsule Commonly known as: COLACE Take 100 mg by mouth 2 (two) times daily.   donepezil 5 MG tablet Commonly known as: ARICEPT Take 5 mg by mouth at bedtime.   epoetin alfa 4000 UNIT/ML injection Commonly known as: EPOGEN Inject 1 mL (4,000 Units total) into the skin Every Tuesday,Thursday,and Saturday with dialysis.   feeding supplement (NEPRO CARB STEADY)  Liqd Take 237 mLs by mouth daily.   folic acid 009 MCG tablet Commonly known as: FOLVITE Take 400 mcg by mouth daily.   gabapentin 100 MG capsule Commonly known as: NEURONTIN Take 2 capsules (200 mg total) by mouth at bedtime.   Generlac 10 GM/15ML Soln Generic drug: lactulose (encephalopathy) Take 10 g by mouth daily.   labetalol 100 MG tablet Commonly known as: NORMODYNE Take 1 tablet (100 mg total) by mouth 2 (two) times daily.   levothyroxine 100 MCG tablet Commonly known as: SYNTHROID Take 100 mcg by mouth daily before breakfast.   meclizine 12.5 MG tablet Commonly known as: ANTIVERT Take 1 tablet (12.5 mg total) by mouth daily as needed for dizziness.   memantine 5 MG tablet Commonly known as: NAMENDA Take 5 mg by mouth 2 (two) times daily.   risperiDONE 0.25 MG tablet Commonly known as: RISPERDAL Take 0.25 mg by  mouth 2 (two) times daily.   sevelamer carbonate 800 MG tablet Commonly known as: RENVELA Take 1 tablet (800 mg total) by mouth 3 (three) times daily with meals.   traMADol 50 MG tablet Commonly known as: ULTRAM Take 25 mg by mouth every 6 (six) hours as needed for moderate pain.   zinc oxide 20 % ointment Apply 1 application topically in the morning, at noon, and at bedtime.         Allergies  Allergen Reactions   Clams [Shellfish Allergy] Swelling and Other (See Comments)    THROAT SWELLS NECK TURNS RED   Hydralazine Other (See Comments)    CHEST TIGHTNESS Patient has tolerated multiple doses of hydralazine since allergy was listed    Norvasc [Amlodipine Besylate] Swelling    Leg edema    Milk-Related Compounds Diarrhea      Procedures/Studies: CT Head Wo Contrast  Result Date: 11/19/2019 CLINICAL DATA:  Altered mental status. EXAM: CT HEAD WITHOUT CONTRAST TECHNIQUE: Contiguous axial images were obtained from the base of the skull through the vertex without intravenous contrast. COMPARISON:  03/29/2017 and brain MR dated 09/08/2019 FINDINGS: Brain: Stable mildly enlarged ventricles and cortical sulci. Stable mild patchy white matter low density in both cerebral hemispheres. No intracranial hemorrhage, mass lesion or CT evidence of acute infarction. Vascular: No hyperdense vessel or unexpected calcification. Skull: Normal. Negative for fracture or focal lesion. Sinuses/Orbits: Mild increase in posteriorly layering fluid/mucosal thickening in the sphenoid sinus on the left. Interval small amount of fluid in the left maxillary sinus. Status post cataract extraction on the right. Unremarkable left orbit. Other: None. IMPRESSION: 1. No acute abnormality. 2. Stable mild diffuse cerebral and cerebellar atrophy and mild chronic small vessel white matter ischemic changes in both cerebral hemispheres. 3. Mild increase in the amount of fluid/mucosal thickening in the sphenoid sinus on the  left. 4. Interval small amount of fluid in the left maxillary sinus, possibly due to acute sinusitis. Electronically Signed   By: Claudie Revering M.D.   On: 11/19/2019 12:17   DG Chest Port 1 View  Result Date: 11/19/2019 CLINICAL DATA:  Altered mental status, bradycardia EXAM: PORTABLE CHEST 1 VIEW COMPARISON:  11/03/2019 chest radiograph. FINDINGS: Right internal jugular central venous catheter terminates over high right atrium. Stable cardiomediastinal silhouette with mild cardiomegaly. No pneumothorax. No pleural effusion. No overt pulmonary edema. No acute consolidative airspace disease. IMPRESSION: Stable mild cardiomegaly without overt pulmonary edema. No active pulmonary disease. Electronically Signed   By: Ilona Sorrel M.D.   On: 11/19/2019 12:37   DG Chest [facility]  Result Date: 11/03/2019 CLINICAL DATA:  Fever. Altered mental status. EXAM: PORTABLE CHEST 1 VIEW COMPARISON:  08/07/2019 FINDINGS: Double lumen central venous catheter in place with the tips in the right atrium, unchanged. Heart size and vascularity are within normal limits considering the AP portable technique. Lungs are clear. Aortic atherosclerosis. No effusions. No acute bone abnormality. IMPRESSION: No acute disease. Electronically Signed   By: Lorriane Shire M.D.   On: 11/03/2019 09:19   ECHOCARDIOGRAM COMPLETE  Result Date: 11/09/2019    ECHOCARDIOGRAM REPORT   Patient Name:   Taylor Tyler Date of Exam: 11/08/2019 Medical Rec #:  [id number]    Height:       66.0 in Accession #:    [id number]   Weight:       167.8 lb Date of Birth:  [birth year]    BSA:          1.856 m Patient Age:    24 years     BP:           117/39 mmHg Patient Gender: F            HR:           59 bpm. Exam Location:  [facility] Procedure: 2D Echo, Cardiac Doppler and Color Doppler Indications:     Leukocytosis  History:         Patient has prior history of Echocardiogram examinations, most                  recent 06/06/2019. Risk Factors:Hypertension and Diabetes.                   Chronic kidney disease. Hypothyroidism. Fatty Liver. COVID-19.                  Bradycardia. Bilateral lower extremity edema.  Sonographer:     Wilford Sports Rodgers-Jones Referring Phys:  [id number] [place] Diagnosing Phys: Nelva Bush MD IMPRESSIONS  1. Left ventricular ejection fraction, by estimation, is 60 to 65%. The left ventricle has normal function. The left ventricle has no regional wall motion abnormalities. There is severe asymmetric left ventricular hypertrophy of the basal-septal segment. Left ventricular diastolic parameters are consistent with Grade I diastolic dysfunction (impaired relaxation). Elevated left atrial pressure.  2. Right ventricular systolic function is normal. The right ventricular size is normal.  3. The mitral valve is normal in structure. No evidence of mitral valve regurgitation. No evidence of mitral stenosis.  4. The aortic valve is tricuspid. Aortic valve regurgitation is not visualized. Mild aortic valve sclerosis is present, with no evidence of aortic valve stenosis.  5. The inferior vena cava is normal in size with greater than 50% respiratory variability, suggesting right atrial pressure of 3 mmHg. FINDINGS  Left Ventricle: Left ventricular ejection fraction, by estimation, is 60 to 65%. The left ventricle has normal function. The left ventricle has no regional wall motion abnormalities. The left ventricular internal cavity size was normal in size. There is  severe asymmetric left ventricular hypertrophy of the basal-septal segment. Left ventricular diastolic parameters are consistent with Grade I diastolic dysfunction (impaired relaxation). Elevated left atrial pressure. Right Ventricle: The right ventricular size is normal. No increase in right ventricular wall thickness. Right ventricular systolic function is normal. Left Atrium: Left atrial size was normal in size. Right Atrium: Right atrial size was normal in size. Pericardium: Trivial pericardial effusion  is present. The pericardial effusion is posterior to the left ventricle. Mitral Valve: The mitral valve is  normal in structure. No evidence of mitral valve regurgitation. No evidence of mitral valve stenosis. Tricuspid Valve: The tricuspid valve is normal in structure. Tricuspid valve regurgitation is trivial. Aortic Valve: The aortic valve is tricuspid. . There is moderate thickening and mild calcification of the aortic valve. Aortic valve regurgitation is not visualized. Mild aortic valve sclerosis is present, with no evidence of aortic valve stenosis. There  is moderate thickening of the aortic valve. There is mild calcification of the aortic valve. Pulmonic Valve: The pulmonic valve was normal in structure. Pulmonic valve regurgitation is trivial. No evidence of pulmonic stenosis. Aorta: The aortic root is normal in size and structure. Pulmonary Artery: The pulmonary artery is of normal size. Venous: The inferior vena cava is normal in size with greater than 50% respiratory variability, suggesting right atrial pressure of 3 mmHg. IAS/Shunts: The interatrial septum was not well visualized. Additional Comments: A venous catheter is visualized in the right atrium.  LEFT VENTRICLE PLAX 2D LVIDd:         3.05 cm  Diastology LVIDs:         1.97 cm  LV e' lateral:   5.66 cm/s LV PW:         1.21 cm  LV E/e' lateral: 15.0 LV IVS:        1.79 cm  LV e' medial:    5.11 cm/s LVOT diam:     2.10 cm  LV E/e' medial:  16.6 LV SV:         106 LV SV Index:   57 LVOT Area:     3.46 cm  RIGHT VENTRICLE RV Basal diam:  3.58 cm RV S prime:     14.30 cm/s TAPSE (M-mode): 2.7 cm LEFT ATRIUM             Index       RIGHT ATRIUM           Index LA diam:        4.20 cm 2.26 cm/m  RA Area:     11.20 cm LA Vol (A2C):   61.1 ml 32.92 ml/m RA Volume:   27.40 ml  14.76 ml/m LA Vol (A4C):   50.1 ml 26.99 ml/m LA Biplane Vol: 56.9 ml 30.66 ml/m  AORTIC VALVE LVOT Vmax:   135.00 cm/s LVOT Vmean:  98.200 cm/s LVOT VTI:    0.305 m  AORTA  Ao Root diam: 2.90 cm MITRAL VALVE MV Area (PHT): 2.99 cm    SHUNTS MV Decel Time: 254 msec    Systemic VTI:  0.30 m MV E velocity: 84.80 cm/s  Systemic Diam: 2.10 cm MV A velocity: 89.20 cm/s MV E/A ratio:  0.95 Harrell Gave End MD Electronically signed by Nelva Bush MD Signature Date/Time: 11/09/2019/6:57:05 AM    Final       Subjective: Patient seen this AM.  Says she feels well, much better today.  No fever chills, pain or discomfort of other issues.  No acute events reproted.    Discharge Exam: Vitals:   11/23/19 1009 11/23/19 1206  BP: (!) 126/41 (!) 118/36  Pulse: 64 62  Resp:  18  Temp: 98.7 F (37.1 C) 97.7 F (36.5 C)  SpO2: 100% 98%   Vitals:   11/22/19 2117 11/23/19 0401 11/23/19 1009 11/23/19 1206  BP: (!) 113/31 (!) 120/41 (!) 126/41 (!) 118/36  Pulse: (!) 59 (!) 57 64 62  Resp: 18 18  18   Temp: 97.7 F (36.5 C) 98.3 F (36.8 C) 98.7  F (37.1 C) 97.7 F (36.5 C)  TempSrc: Oral Oral Oral Oral  SpO2: 100% 98% 100% 98%  Weight:      Height:        General: Pt is alert, awake, not in acute distress, obese Cardiovascular: RRR, S1/S2 +, no rubs, no gallops Respiratory: CTA bilaterally, no wheezing, no rhonchi Abdominal: Soft, NT, ND, bowel sounds + Extremities: offloading boots on b/l feet, overgrown onychomycotic toenails    The results of significant diagnostics from this hospitalization (including imaging, microbiology, ancillary and laboratory) are listed below for reference.     Microbiology: Recent Results (from the past 240 hour(s))  Blood culture (routine x 2)     Status: None (Preliminary result)   Collection Time: 11/19/19 12:22 PM   Specimen: BLOOD  Result Value Ref Range Status   Specimen Description BLOOD LAC  Final   Special Requests   Final    BOTTLES DRAWN AEROBIC AND ANAEROBIC Blood Culture adequate volume   Culture   Final    NO GROWTH 4 DAYS Performed at [facility], [address] [place], [place] [address]     Report Status PENDING  Incomplete  Blood culture (routine x 2)     Status: None (Preliminary result)   Collection Time: 11/19/19 12:32 PM   Specimen: BLOOD  Result Value Ref Range Status   Specimen Description BLOOD BLOOD LEFT HAND  Final   Special Requests   Final    BOTTLES DRAWN AEROBIC ONLY Blood Culture results may not be optimal due to an inadequate volume of blood received in culture bottles   Culture   Final    NO GROWTH 4 DAYS Performed at [facility], [address] [place], [place] [address]    Report Status PENDING  Incomplete  SARS CORONAVIRUS 2 (TAT 6-24 HRS) Nasopharyngeal Nasopharyngeal Swab     Status: None   Collection Time: 11/19/19  4:11 PM   Specimen: Nasopharyngeal Swab  Result Value Ref Range Status   SARS Coronavirus 2 NEGATIVE NEGATIVE Final    Comment: (NOTE) SARS-CoV-2 target nucleic acids are NOT DETECTED. The SARS-CoV-2 RNA is generally detectable in upper and lower respiratory specimens during the acute phase of infection. Negative results do not preclude SARS-CoV-2 infection, do not rule out co-infections with other pathogens, and should not be used as the sole basis for treatment or other patient management decisions. Negative results must be combined with clinical observations, patient history, and epidemiological information. The expected result is Negative. Fact Sheet for Patients: SugarRoll.be Fact Sheet for Healthcare Providers: https://www.woods-mathews.com/ This test is not yet approved or cleared by the [place] FDA and  has been authorized for detection and/or diagnosis of SARS-CoV-2 by FDA under an Emergency Use Authorization (EUA). This EUA will remain  in effect (meaning this test can be used) for the duration of the COVID-19 declaration under Section 56 4(b)(1) of the Act, 21 U.S.C. section 360bbb-3(b)(1), unless the authorization is terminated or revoked sooner. Performed at  [facility], [place] [address] [place], [place] [address]   Aerobic/Anaerobic Culture (surgical/deep wound)     Status: None   Collection Time: 11/19/19  6:04 PM   Specimen: Wound  Result Value Ref Range Status   Specimen Description   Final    WOUND Performed at [facility], [address] [place], [place] [address]    Special Requests   Final    Normal Performed at [facility], [place],  [place] [address]    Gram Stain NO WBC SEEN FEW GRAM NEGATIVE RODS   Final   Culture   Final    MODERATE ESCHERICHIA COLI MODERATE PROTEUS MIRABILIS ABUNDANT BACTEROIDES OVATUS BETA LACTAMASE POSITIVE Performed at [facility], [place] [address] [place], [place] [address]    Report Status 11/22/2019 FINAL  Final   Organism ID, Bacteria ESCHERICHIA COLI  Final   Organism ID, Bacteria PROTEUS MIRABILIS  Final      Susceptibility   Escherichia coli - MIC*    AMPICILLIN >=32 RESISTANT Resistant     CEFAZOLIN >=64 RESISTANT Resistant     CEFEPIME <=0.12 SENSITIVE Sensitive     CEFTAZIDIME 2 SENSITIVE Sensitive     CEFTRIAXONE 2 INTERMEDIATE Intermediate     CIPROFLOXACIN <=0.25 SENSITIVE Sensitive     GENTAMICIN <=1 SENSITIVE Sensitive     IMIPENEM <=0.25 SENSITIVE Sensitive     TRIMETH/SULFA <=20 SENSITIVE Sensitive     AMPICILLIN/SULBACTAM 8 SENSITIVE Sensitive     PIP/TAZO <=4 SENSITIVE Sensitive     * MODERATE ESCHERICHIA COLI   Proteus mirabilis - MIC*    AMPICILLIN <=2 SENSITIVE Sensitive     CEFAZOLIN <=4 SENSITIVE Sensitive     CEFEPIME <=0.12 SENSITIVE Sensitive     CEFTAZIDIME <=1 SENSITIVE Sensitive     CEFTRIAXONE <=0.25 SENSITIVE Sensitive     CIPROFLOXACIN <=0.25 SENSITIVE Sensitive     GENTAMICIN <=1 SENSITIVE Sensitive     IMIPENEM 8 INTERMEDIATE Intermediate     TRIMETH/SULFA <=20 SENSITIVE Sensitive     AMPICILLIN/SULBACTAM <=2 SENSITIVE Sensitive     PIP/TAZO <=4 SENSITIVE Sensitive     * MODERATE PROTEUS MIRABILIS      Labs: BNP (last 3 results) Recent Labs    05/30/19 2056  BNP 694.8*   Basic Metabolic Panel: Recent Labs  Lab 11/19/19 1221 11/19/19 2121 11/20/19 0504 11/21/19 0826 11/22/19 1544  NA 132*  --  136 133* 132*  K 3.4*  --  3.0* 3.2* 3.9  CL 99  --  100 100 98  CO2 26  --  30 25 26   GLUCOSE 100*  --  74 121* 79  BUN 23  --  11 21 12   CREATININE 3.10*  --  1.86* 2.68* 1.58*  CALCIUM 8.4*  --  7.9* 8.1* 7.5*  PHOS  --  <1.0*  --  1.6* 2.0*   Liver Function Tests: Recent Labs  Lab 11/19/19 1221  AST 15  ALT 10  ALKPHOS 87  BILITOT 0.5  PROT 5.8*  ALBUMIN 1.5*   No results for input(s): LIPASE, AMYLASE in the last 168 hours. No results for input(s): AMMONIA in the last 168 hours. CBC: Recent Labs  Lab 11/19/19 1111 11/20/19 0504 11/21/19 0826  WBC 21.0* 16.4* 10.0  NEUTROABS  --   --  7.4  HGB 8.6* 8.4* 8.2*  HCT 28.7* 29.3* 28.3*  MCV 90.5 93.0 92.8  PLT 141* 125* 123*   Cardiac Enzymes: No results for input(s): CKTOTAL, CKMB, CKMBINDEX, TROPONINI in the last 168 hours. BNP: Invalid input(s): POCBNP CBG: Recent Labs  Lab 11/22/19 0817 11/22/19 1632 11/22/19 2225 11/23/19 0806 11/23/19 1203  GLUCAP 94 81 77 88 96   D-Dimer No results for input(s): DDIMER in the last 72 hours. Hgb A1c No results for input(s): HGBA1C in the last 72 hours. Lipid Profile No results for input(s): CHOL, HDL, LDLCALC, TRIG, CHOLHDL, LDLDIRECT in the last 72 hours. Thyroid function studies No results for input(s): TSH, T4TOTAL, T3FREE,  THYROIDAB in the last 72 hours.  Invalid input(s): FREET3 Anemia work up No results for input(s): VITAMINB12, FOLATE, FERRITIN, TIBC, IRON, RETICCTPCT in the last 72 hours. Urinalysis    Component Value Date/Time   COLORURINE AMBER (A) 06/15/2019 1800   APPEARANCEUR CLOUDY (A) 06/15/2019 1800   APPEARANCEUR Cloudy 12/14/2013 1727   LABSPEC 1.026 06/15/2019 1800   LABSPEC 1.012 12/14/2013 1727   PHURINE 5.0 06/15/2019 1800    GLUCOSEU 50 (A) 06/15/2019 1800   GLUCOSEU NEGATIVE 07/21/2017 1001   HGBUR NEGATIVE 06/15/2019 1800   BILIRUBINUR NEGATIVE 06/15/2019 1800   BILIRUBINUR Negative 12/14/2013 1727   KETONESUR NEGATIVE 06/15/2019 1800   PROTEINUR >=300 (A) 06/15/2019 1800   UROBILINOGEN 0.2 07/21/2017 1001   NITRITE NEGATIVE 06/15/2019 1800   LEUKOCYTESUR MODERATE (A) 06/15/2019 1800   LEUKOCYTESUR 3+ 12/14/2013 1727   Sepsis Labs Invalid input(s): PROCALCITONIN,  WBC,  LACTICIDVEN Microbiology Recent Results (from the past 240 hour(s))  Blood culture (routine x 2)     Status: None (Preliminary result)   Collection Time: 11/19/19 12:22 PM   Specimen: BLOOD  Result Value Ref Range Status   Specimen Description BLOOD LAC  Final   Special Requests   Final    BOTTLES DRAWN AEROBIC AND ANAEROBIC Blood Culture adequate volume   Culture   Final    NO GROWTH 4 DAYS Performed at [facility], [address] [place], [place] [address]    Report Status PENDING  Incomplete  Blood culture (routine x 2)     Status: None (Preliminary result)   Collection Time: 11/19/19 12:32 PM   Specimen: BLOOD  Result Value Ref Range Status   Specimen Description BLOOD BLOOD LEFT HAND  Final   Special Requests   Final    BOTTLES DRAWN AEROBIC ONLY Blood Culture results may not be optimal due to an inadequate volume of blood received in culture bottles   Culture   Final    NO GROWTH 4 DAYS Performed at [facility], [address] [place], [place] [address]    Report Status PENDING  Incomplete  SARS CORONAVIRUS 2 (TAT 6-24 HRS) Nasopharyngeal Nasopharyngeal Swab     Status: None   Collection Time: 11/19/19  4:11 PM   Specimen: Nasopharyngeal Swab  Result Value Ref Range Status   SARS Coronavirus 2 NEGATIVE NEGATIVE Final    Comment: (NOTE) SARS-CoV-2 target nucleic acids are NOT DETECTED. The SARS-CoV-2 RNA is generally detectable in upper and lower respiratory specimens during the acute phase of  infection. Negative results do not preclude SARS-CoV-2 infection, do not rule out co-infections with other pathogens, and should not be used as the sole basis for treatment or other patient management decisions. Negative results must be combined with clinical observations, patient history, and epidemiological information. The expected result is Negative. Fact Sheet for Patients: SugarRoll.be Fact Sheet for Healthcare Providers: https://www.woods-mathews.com/ This test is not yet approved or cleared by the [place] FDA and  has been authorized for detection and/or diagnosis of SARS-CoV-2 by FDA under an Emergency Use Authorization (EUA). This EUA will remain  in effect (meaning this test can be used) for the duration of the COVID-19 declaration under Section 56 4(b)(1) of the Act, 21 U.S.C. section 360bbb-3(b)(1), unless the authorization is terminated or revoked sooner. Performed at [facility], [place] [address], [place], [place] [address]   Aerobic/Anaerobic Culture (surgical/deep wound)     Status: None   Collection Time: 11/19/19  6:04 PM   Specimen:  Wound  Result Value Ref Range Status   Specimen Description   Final    WOUND Performed at [facility], [address] [place], [place] [address]    Special Requests   Final    Normal Performed at [facility], [address] [place], [place] [address]    Gram Stain NO WBC SEEN FEW GRAM NEGATIVE RODS   Final   Culture   Final    MODERATE ESCHERICHIA COLI MODERATE PROTEUS MIRABILIS ABUNDANT BACTEROIDES OVATUS BETA LACTAMASE POSITIVE Performed at [facility], [place] [address] [place], [place] [address]    Report Status 11/22/2019 FINAL  Final   Organism ID, Bacteria ESCHERICHIA COLI  Final   Organism ID, Bacteria PROTEUS MIRABILIS  Final      Susceptibility   Escherichia coli - MIC*    AMPICILLIN >=32 RESISTANT Resistant     CEFAZOLIN >=64  RESISTANT Resistant     CEFEPIME <=0.12 SENSITIVE Sensitive     CEFTAZIDIME 2 SENSITIVE Sensitive     CEFTRIAXONE 2 INTERMEDIATE Intermediate     CIPROFLOXACIN <=0.25 SENSITIVE Sensitive     GENTAMICIN <=1 SENSITIVE Sensitive     IMIPENEM <=0.25 SENSITIVE Sensitive     TRIMETH/SULFA <=20 SENSITIVE Sensitive     AMPICILLIN/SULBACTAM 8 SENSITIVE Sensitive     PIP/TAZO <=4 SENSITIVE Sensitive     * MODERATE ESCHERICHIA COLI   Proteus mirabilis - MIC*    AMPICILLIN <=2 SENSITIVE Sensitive     CEFAZOLIN <=4 SENSITIVE Sensitive     CEFEPIME <=0.12 SENSITIVE Sensitive     CEFTAZIDIME <=1 SENSITIVE Sensitive     CEFTRIAXONE <=0.25 SENSITIVE Sensitive     CIPROFLOXACIN <=0.25 SENSITIVE Sensitive     GENTAMICIN <=1 SENSITIVE Sensitive     IMIPENEM 8 INTERMEDIATE Intermediate     TRIMETH/SULFA <=20 SENSITIVE Sensitive     AMPICILLIN/SULBACTAM <=2 SENSITIVE Sensitive     PIP/TAZO <=4 SENSITIVE Sensitive     * MODERATE PROTEUS MIRABILIS     Time coordinating discharge: Over 30 minutes  SIGNED:   Ezekiel Slocumb, DO [facility] 11/23/2019, 1:01 PM   If 7PM-7AM, please contact night-coverage [email or web address]

## 2019-11-23 NOTE — TOC Transition Note (Signed)
Transition of Care [facility]) - CM/SW Discharge Note   Patient Details  Name: [name] MRN: [id number] Date of Birth: 1941/02/16  Transition of Care [facility]) CM/SW Contact:  Candie Chroman, LCSW Phone Number: 11/23/2019, 2:08 PM   Clinical Narrative: Patient has orders to discharge to [facility] today. Nurse has already called report and EMS has been arranged. No further concerns. CSW signing off.    Final next level of care: Skilled Nursing Facility Barriers to Discharge: Barriers Resolved   Patient Goals and CMS Choice Patient states their goals for this hospitalization and ongoing recovery are:: Patient not fully oriented.   Choice offered to / list presented to : NA  Discharge Placement   Existing PASRR number confirmed : 11/22/19          Patient chooses bed at: [facility] Patient to be transferred to facility by: EMS Name of family member notified: Jerusalen Mateja Patient and family notified of of transfer: 11/23/19  Discharge Plan and Services     Post Acute Care Choice: Resumption of Svcs/PTA Provider                               Social Determinants of Health (SDOH) Interventions     Readmission Risk Interventions No flowsheet data found.

## 2019-11-23 NOTE — Plan of Care (Signed)
The patient has been discharged via EMS to SNF. No falls. IV removed. No falls. Dressing changed performed to lumbar and sacral region.  Problem: Education: Goal: Knowledge of General Education information will improve Description: Including pain rating scale, medication(s)/side effects and non-pharmacologic comfort measures Outcome: Completed/Met   Problem: Health Behavior/Discharge Planning: Goal: Ability to manage health-related needs will improve Outcome: Completed/Met   Problem: Clinical Measurements: Goal: Ability to maintain clinical measurements within normal limits will improve Outcome: Completed/Met Goal: Will remain free from infection Outcome: Completed/Met Goal: Diagnostic test results will improve Outcome: Completed/Met Goal: Respiratory complications will improve Outcome: Completed/Met Goal: Cardiovascular complication will be avoided Outcome: Completed/Met   Problem: Activity: Goal: Risk for activity intolerance will decrease Outcome: Completed/Met   Problem: Nutrition: Goal: Adequate nutrition will be maintained Outcome: Completed/Met   Problem: Coping: Goal: Level of anxiety will decrease Outcome: Completed/Met   Problem: Elimination: Goal: Will not experience complications related to bowel motility Outcome: Completed/Met Goal: Will not experience complications related to urinary retention Outcome: Completed/Met   Problem: Pain Managment: Goal: General experience of comfort will improve Outcome: Completed/Met   Problem: Safety: Goal: Ability to remain free from injury will improve Outcome: Completed/Met   Problem: Skin Integrity: Goal: Risk for impaired skin integrity will decrease Outcome: Completed/Met

## 2019-11-23 NOTE — Progress Notes (Signed)
[facility]  ROUNDING NOTE   Subjective:  Patient more awake and alert today. Appears to be resting comfortably at the moment. Due for dialysis again tomorrow if still here.  Objective:  Vital signs in last 24 hours:  Temp:  [97.7 F (36.5 C)-98.7 F (37.1 C)] 97.7 F (36.5 C) (04/21 1206) Pulse Rate:  [57-64] 62 (04/21 1206) Resp:  [18] 18 (04/21 1206) BP: (113-126)/(31-41) 118/36 (04/21 1206) SpO2:  [98 %-100 %] 98 % (04/21 1206)  Weight change:  Filed Weights   11/19/19 1058  Weight: 75 kg    Intake/Output: I/O last 3 completed shifts: In: 304.3 [I.V.:0.1; IV Piggyback:304.2] Out: 500 [Other:500]   Intake/Output this shift:  No intake/output data recorded.  Physical Exam: General: No acute distress  Head: Normocephalic, atraumatic. Moist oral mucosal membranes  Eyes: Anicteric  Neck: Supple, trachea midline  Lungs:  Clear to auscultation, normal effort  Heart: S1S2 no rubs  Abdomen:  Soft, nontender, bowel sounds present  Extremities: Trace peripheral edema.  Neurologic: Awake, alert, conversant  Skin: Warm/dry  Access: Right IJ PermCath    Basic Metabolic Panel: Recent Labs  Lab 11/19/19 1221 11/19/19 1221 11/19/19 2121 11/20/19 0504 11/21/19 0826 11/22/19 1544  NA 132*  --   --  136 133* 132*  K 3.4*  --   --  3.0* 3.2* 3.9  CL 99  --   --  100 100 98  CO2 26  --   --  30 25 26   GLUCOSE 100*  --   --  74 121* 79  BUN 23  --   --  11 21 12   CREATININE 3.10*  --   --  1.86* 2.68* 1.58*  CALCIUM 8.4*   < >  --  7.9* 8.1* 7.5*  PHOS  --   --  <1.0*  --  1.6* 2.0*   < > = values in this interval not displayed.    Liver Function Tests: Recent Labs  Lab 11/19/19 1221  AST 15  ALT 10  ALKPHOS 87  BILITOT 0.5  PROT 5.8*  ALBUMIN 1.5*   No results for input(s): LIPASE, AMYLASE in the last 168 hours. No results for input(s): AMMONIA in the last 168 hours.  CBC: Recent Labs  Lab 11/19/19 1111 11/20/19 0504 11/21/19 0826   WBC 21.0* 16.4* 10.0  NEUTROABS  --   --  7.4  HGB 8.6* 8.4* 8.2*  HCT 28.7* 29.3* 28.3*  MCV 90.5 93.0 92.8  PLT 141* 125* 123*    Cardiac Enzymes: No results for input(s): CKTOTAL, CKMB, CKMBINDEX, TROPONINI in the last 168 hours.  BNP: Invalid input(s): POCBNP  CBG: Recent Labs  Lab 11/22/19 0817 11/22/19 1632 11/22/19 2225 11/23/19 0806 11/23/19 1203  GLUCAP 94 81 77 88 96    Microbiology: Results for orders placed or performed during the hospital encounter of 11/19/19  Blood culture (routine x 2)     Status: None (Preliminary result)   Collection Time: 11/19/19 12:22 PM   Specimen: BLOOD  Result Value Ref Range Status   Specimen Description BLOOD LAC  Final   Special Requests   Final    BOTTLES DRAWN AEROBIC AND ANAEROBIC Blood Culture adequate volume   Culture   Final    NO GROWTH 4 DAYS Performed at [facility], [address] [place], [place] [address]    Report Status PENDING  Incomplete  Blood culture (routine x 2)     Status: None (Preliminary result)  Collection Time: 11/19/19 12:32 PM   Specimen: BLOOD  Result Value Ref Range Status   Specimen Description BLOOD BLOOD LEFT HAND  Final   Special Requests   Final    BOTTLES DRAWN AEROBIC ONLY Blood Culture results may not be optimal due to an inadequate volume of blood received in culture bottles   Culture   Final    NO GROWTH 4 DAYS Performed at [facility], [address], [place], [place] [address]    Report Status PENDING  Incomplete  SARS CORONAVIRUS 2 (TAT 6-24 HRS) Nasopharyngeal Nasopharyngeal Swab     Status: None   Collection Time: 11/19/19  4:11 PM   Specimen: Nasopharyngeal Swab  Result Value Ref Range Status   SARS Coronavirus 2 NEGATIVE NEGATIVE Final    Comment: (NOTE) SARS-CoV-2 target nucleic acids are NOT DETECTED. The SARS-CoV-2 RNA is generally detectable in upper and lower respiratory specimens during the acute phase of infection. Negative results do  not preclude SARS-CoV-2 infection, do not rule out co-infections with other pathogens, and should not be used as the sole basis for treatment or other patient management decisions. Negative results must be combined with clinical observations, patient history, and epidemiological information. The expected result is Negative. Fact Sheet for Patients: SugarRoll.be Fact Sheet for Healthcare Providers: https://www.woods-mathews.com/ This test is not yet approved or cleared by the [place] FDA and  has been authorized for detection and/or diagnosis of SARS-CoV-2 by FDA under an Emergency Use Authorization (EUA). This EUA will remain  in effect (meaning this test can be used) for the duration of the COVID-19 declaration under Section 56 4(b)(1) of the Act, 21 U.S.C. section 360bbb-3(b)(1), unless the authorization is terminated or revoked sooner. Performed at [facility], [place] [address], [place], [place] [address]   Aerobic/Anaerobic Culture (surgical/deep wound)     Status: None   Collection Time: 11/19/19  6:04 PM   Specimen: Wound  Result Value Ref Range Status   Specimen Description   Final    WOUND Performed at [facility], [address] [place], [place] [address]    Special Requests   Final    Normal Performed at [place], [place] [address]    Gram Stain NO WBC SEEN FEW GRAM NEGATIVE RODS   Final   Culture   Final    MODERATE ESCHERICHIA COLI MODERATE PROTEUS MIRABILIS ABUNDANT BACTEROIDES OVATUS BETA LACTAMASE POSITIVE Performed at [facility], [place] [address], [place], [place] [address]    Report Status 11/22/2019 FINAL  Final   Organism ID, Bacteria ESCHERICHIA COLI  Final   Organism ID, Bacteria PROTEUS MIRABILIS  Final      Susceptibility   Escherichia coli - MIC*    AMPICILLIN >=32 RESISTANT Resistant     CEFAZOLIN >=64 RESISTANT Resistant     CEFEPIME  <=0.12 SENSITIVE Sensitive     CEFTAZIDIME 2 SENSITIVE Sensitive     CEFTRIAXONE 2 INTERMEDIATE Intermediate     CIPROFLOXACIN <=0.25 SENSITIVE Sensitive     GENTAMICIN <=1 SENSITIVE Sensitive     IMIPENEM <=0.25 SENSITIVE Sensitive     TRIMETH/SULFA <=20 SENSITIVE Sensitive     AMPICILLIN/SULBACTAM 8 SENSITIVE Sensitive     PIP/TAZO <=4 SENSITIVE Sensitive     * MODERATE ESCHERICHIA COLI   Proteus mirabilis - MIC*    AMPICILLIN <=2 SENSITIVE Sensitive     CEFAZOLIN <=4 SENSITIVE Sensitive     CEFEPIME <=0.12 SENSITIVE Sensitive  CEFTAZIDIME <=1 SENSITIVE Sensitive     CEFTRIAXONE <=0.25 SENSITIVE Sensitive     CIPROFLOXACIN <=0.25 SENSITIVE Sensitive     GENTAMICIN <=1 SENSITIVE Sensitive     IMIPENEM 8 INTERMEDIATE Intermediate     TRIMETH/SULFA <=20 SENSITIVE Sensitive     AMPICILLIN/SULBACTAM <=2 SENSITIVE Sensitive     PIP/TAZO <=4 SENSITIVE Sensitive     * MODERATE PROTEUS MIRABILIS    Coagulation Studies: No results for input(s): LABPROT, INR in the last 72 hours.  Urinalysis: No results for input(s): COLORURINE, LABSPEC, PHURINE, GLUCOSEU, HGBUR, BILIRUBINUR, KETONESUR, PROTEINUR, UROBILINOGEN, NITRITE, LEUKOCYTESUR in the last 72 hours.  Invalid input(s): APPERANCEUR    Imaging: No results found.   Medications:   . sodium chloride Stopped (11/21/19 2019)   . amoxicillin-clavulanate  1 tablet Oral Q12H  . atorvastatin  40 mg Oral q1800  . Chlorhexidine Gluconate Cloth  6 each Topical Q0600  . citalopram  10 mg Oral Daily  . collagenase   Topical Daily  . divalproex  125 mg Oral BID  . docusate sodium  100 mg Oral BID  . donepezil  5 mg Oral QHS  . epoetin alfa  4,000 Units Subcutaneous Q T,Th,Sa-HD  . feeding supplement (NEPRO CARB STEADY)  237 mL Oral Q24H  . folic acid  917 mcg Oral Daily  . gabapentin  200 mg Oral QHS  . heparin  5,000 Units Subcutaneous Q8H  . insulin aspart  0-15 Units Subcutaneous TID WC  . insulin aspart  4 Units Subcutaneous  TID WC  . labetalol  100 mg Oral BID  . lactulose  10 g Oral Daily  . levothyroxine  100 mcg Oral QAC breakfast  . memantine  5 mg Oral BID  . sodium chloride flush  3 mL Intravenous Q12H   sodium chloride, acetaminophen, benzonatate, meclizine, ondansetron **OR** ondansetron (ZOFRAN) IV, sodium chloride flush  Assessment/ Plan:  79 y.o. female  is a 79 y.o. black female with end stage renal disease on hemodialysis, dementia, diabetes mellitus type II, hypertension, hypothyroidism, osteomyelitis now admitted with altered mental status which is recurrent.  [facility] [address] TTS RIJ [phone].  1.  ESRD on HD TTS.  Patient had dialysis treatment yesterday.  No acute indication for dialysis today.  Next dialysis treatment scheduled for tomorrow.  2.  Altered mental status.  Her mental status did appear improved today.  She was awake, alert and conversant.  3.  Anemia of chronic kidney disease.   Lab Results  Component Value Date   HGB 8.2 (L) 11/21/2019  Continue Epogen 4000 units TTS with dialysis.  4.  Secondary hyperparathyroidism.  Phosphorus up to 2.0 yesterday.  Recheck tomorrow.  5.  Hypokalemia.  Potassium now up to 3.9 and much better than before.   LOS: 4 Taylor Tyler 4/21/20213:18 PM

## 2019-11-24 DIAGNOSIS — N2581 Secondary hyperparathyroidism of renal origin: Secondary | ICD-10-CM | POA: Diagnosis not present

## 2019-11-24 DIAGNOSIS — D509 Iron deficiency anemia, unspecified: Secondary | ICD-10-CM | POA: Diagnosis not present

## 2019-11-24 DIAGNOSIS — N186 End stage renal disease: Secondary | ICD-10-CM | POA: Diagnosis not present

## 2019-11-24 DIAGNOSIS — D689 Coagulation defect, unspecified: Secondary | ICD-10-CM | POA: Diagnosis not present

## 2019-11-24 DIAGNOSIS — D631 Anemia in chronic kidney disease: Secondary | ICD-10-CM | POA: Diagnosis not present

## 2019-11-24 DIAGNOSIS — Z992 Dependence on renal dialysis: Secondary | ICD-10-CM | POA: Diagnosis not present

## 2019-11-24 LAB — CULTURE, BLOOD (ROUTINE X 2)
Culture: NO GROWTH
Culture: NO GROWTH
Special Requests: ADEQUATE

## 2019-11-25 ENCOUNTER — Inpatient Hospital Stay: Payer: Medicare Other | Admitting: Oncology

## 2019-11-25 ENCOUNTER — Telehealth: Payer: Self-pay | Admitting: Oncology

## 2019-11-25 NOTE — Telephone Encounter (Signed)
MD was not able to connect with patient for appt. Writer attempted to phone WOM to reschedule but could not talk with scheduler or leave a message. Writer to attempt to phone again to reschedule at another time.

## 2019-11-26 DIAGNOSIS — Z992 Dependence on renal dialysis: Secondary | ICD-10-CM | POA: Diagnosis not present

## 2019-11-26 DIAGNOSIS — D689 Coagulation defect, unspecified: Secondary | ICD-10-CM | POA: Diagnosis not present

## 2019-11-26 DIAGNOSIS — D509 Iron deficiency anemia, unspecified: Secondary | ICD-10-CM | POA: Diagnosis not present

## 2019-11-26 DIAGNOSIS — N2581 Secondary hyperparathyroidism of renal origin: Secondary | ICD-10-CM | POA: Diagnosis not present

## 2019-11-26 DIAGNOSIS — N186 End stage renal disease: Secondary | ICD-10-CM | POA: Diagnosis not present

## 2019-11-26 DIAGNOSIS — D631 Anemia in chronic kidney disease: Secondary | ICD-10-CM | POA: Diagnosis not present

## 2019-11-29 DIAGNOSIS — Z992 Dependence on renal dialysis: Secondary | ICD-10-CM | POA: Diagnosis not present

## 2019-11-29 DIAGNOSIS — D689 Coagulation defect, unspecified: Secondary | ICD-10-CM | POA: Diagnosis not present

## 2019-11-29 DIAGNOSIS — D509 Iron deficiency anemia, unspecified: Secondary | ICD-10-CM | POA: Diagnosis not present

## 2019-11-29 DIAGNOSIS — D631 Anemia in chronic kidney disease: Secondary | ICD-10-CM | POA: Diagnosis not present

## 2019-11-29 DIAGNOSIS — N2581 Secondary hyperparathyroidism of renal origin: Secondary | ICD-10-CM | POA: Diagnosis not present

## 2019-11-29 DIAGNOSIS — N186 End stage renal disease: Secondary | ICD-10-CM | POA: Diagnosis not present

## 2019-11-30 DIAGNOSIS — D649 Anemia, unspecified: Secondary | ICD-10-CM | POA: Diagnosis not present

## 2019-11-30 DIAGNOSIS — G3184 Mild cognitive impairment, so stated: Secondary | ICD-10-CM | POA: Diagnosis not present

## 2019-11-30 DIAGNOSIS — R413 Other amnesia: Secondary | ICD-10-CM | POA: Diagnosis not present

## 2019-11-30 DIAGNOSIS — N186 End stage renal disease: Secondary | ICD-10-CM | POA: Diagnosis not present

## 2019-12-01 DIAGNOSIS — D631 Anemia in chronic kidney disease: Secondary | ICD-10-CM | POA: Diagnosis not present

## 2019-12-01 DIAGNOSIS — D509 Iron deficiency anemia, unspecified: Secondary | ICD-10-CM | POA: Diagnosis not present

## 2019-12-01 DIAGNOSIS — D689 Coagulation defect, unspecified: Secondary | ICD-10-CM | POA: Diagnosis not present

## 2019-12-01 DIAGNOSIS — N186 End stage renal disease: Secondary | ICD-10-CM | POA: Diagnosis not present

## 2019-12-01 DIAGNOSIS — N2581 Secondary hyperparathyroidism of renal origin: Secondary | ICD-10-CM | POA: Diagnosis not present

## 2019-12-01 DIAGNOSIS — Z992 Dependence on renal dialysis: Secondary | ICD-10-CM | POA: Diagnosis not present

## 2019-12-02 DIAGNOSIS — N186 End stage renal disease: Secondary | ICD-10-CM | POA: Diagnosis not present

## 2019-12-02 DIAGNOSIS — E1122 Type 2 diabetes mellitus with diabetic chronic kidney disease: Secondary | ICD-10-CM | POA: Diagnosis not present

## 2019-12-02 DIAGNOSIS — Z992 Dependence on renal dialysis: Secondary | ICD-10-CM | POA: Diagnosis not present

## 2019-12-03 DIAGNOSIS — Z992 Dependence on renal dialysis: Secondary | ICD-10-CM | POA: Diagnosis not present

## 2019-12-03 DIAGNOSIS — D509 Iron deficiency anemia, unspecified: Secondary | ICD-10-CM | POA: Diagnosis not present

## 2019-12-03 DIAGNOSIS — D631 Anemia in chronic kidney disease: Secondary | ICD-10-CM | POA: Diagnosis not present

## 2019-12-03 DIAGNOSIS — N186 End stage renal disease: Secondary | ICD-10-CM | POA: Diagnosis not present

## 2019-12-03 DIAGNOSIS — N2581 Secondary hyperparathyroidism of renal origin: Secondary | ICD-10-CM | POA: Diagnosis not present

## 2019-12-03 DIAGNOSIS — D689 Coagulation defect, unspecified: Secondary | ICD-10-CM | POA: Diagnosis not present

## 2019-12-05 DIAGNOSIS — I130 Hypertensive heart and chronic kidney disease with heart failure and stage 1 through stage 4 chronic kidney disease, or unspecified chronic kidney disease: Secondary | ICD-10-CM | POA: Diagnosis not present

## 2019-12-06 DIAGNOSIS — D689 Coagulation defect, unspecified: Secondary | ICD-10-CM | POA: Diagnosis not present

## 2019-12-06 DIAGNOSIS — Z992 Dependence on renal dialysis: Secondary | ICD-10-CM | POA: Diagnosis not present

## 2019-12-06 DIAGNOSIS — N186 End stage renal disease: Secondary | ICD-10-CM | POA: Diagnosis not present

## 2019-12-06 DIAGNOSIS — N2581 Secondary hyperparathyroidism of renal origin: Secondary | ICD-10-CM | POA: Diagnosis not present

## 2019-12-06 DIAGNOSIS — D631 Anemia in chronic kidney disease: Secondary | ICD-10-CM | POA: Diagnosis not present

## 2019-12-06 DIAGNOSIS — D509 Iron deficiency anemia, unspecified: Secondary | ICD-10-CM | POA: Diagnosis not present

## 2019-12-07 DIAGNOSIS — L89322 Pressure ulcer of left buttock, stage 2: Secondary | ICD-10-CM | POA: Diagnosis not present

## 2019-12-07 DIAGNOSIS — T8189XD Other complications of procedures, not elsewhere classified, subsequent encounter: Secondary | ICD-10-CM | POA: Diagnosis not present

## 2019-12-07 DIAGNOSIS — L89150 Pressure ulcer of sacral region, unstageable: Secondary | ICD-10-CM | POA: Diagnosis not present

## 2019-12-08 ENCOUNTER — Ambulatory Visit: Payer: Medicare Other

## 2019-12-08 DIAGNOSIS — D689 Coagulation defect, unspecified: Secondary | ICD-10-CM | POA: Diagnosis not present

## 2019-12-08 DIAGNOSIS — N186 End stage renal disease: Secondary | ICD-10-CM | POA: Diagnosis not present

## 2019-12-08 DIAGNOSIS — D509 Iron deficiency anemia, unspecified: Secondary | ICD-10-CM | POA: Diagnosis not present

## 2019-12-08 DIAGNOSIS — Z992 Dependence on renal dialysis: Secondary | ICD-10-CM | POA: Diagnosis not present

## 2019-12-08 DIAGNOSIS — D631 Anemia in chronic kidney disease: Secondary | ICD-10-CM | POA: Diagnosis not present

## 2019-12-08 DIAGNOSIS — N2581 Secondary hyperparathyroidism of renal origin: Secondary | ICD-10-CM | POA: Diagnosis not present

## 2019-12-09 ENCOUNTER — Ambulatory Visit: Payer: Medicare Other

## 2019-12-10 DIAGNOSIS — D509 Iron deficiency anemia, unspecified: Secondary | ICD-10-CM | POA: Diagnosis not present

## 2019-12-10 DIAGNOSIS — D631 Anemia in chronic kidney disease: Secondary | ICD-10-CM | POA: Diagnosis not present

## 2019-12-10 DIAGNOSIS — Z992 Dependence on renal dialysis: Secondary | ICD-10-CM | POA: Diagnosis not present

## 2019-12-10 DIAGNOSIS — D689 Coagulation defect, unspecified: Secondary | ICD-10-CM | POA: Diagnosis not present

## 2019-12-10 DIAGNOSIS — N2581 Secondary hyperparathyroidism of renal origin: Secondary | ICD-10-CM | POA: Diagnosis not present

## 2019-12-10 DIAGNOSIS — N186 End stage renal disease: Secondary | ICD-10-CM | POA: Diagnosis not present

## 2019-12-11 ENCOUNTER — Other Ambulatory Visit: Payer: Self-pay

## 2019-12-11 ENCOUNTER — Encounter: Payer: Self-pay | Admitting: Emergency Medicine

## 2019-12-11 ENCOUNTER — Inpatient Hospital Stay: Payer: Medicare Other

## 2019-12-11 ENCOUNTER — Inpatient Hospital Stay
Admission: EM | Admit: 2019-12-11 | Discharge: 2019-12-30 | DRG: 871 | Disposition: A | Discharge status: Skilled Nursing Facility | Payer: Medicare Other | Attending: Internal Medicine | Admitting: Internal Medicine

## 2019-12-11 ENCOUNTER — Emergency Department: Payer: Medicare Other

## 2019-12-11 DIAGNOSIS — M7989 Other specified soft tissue disorders: Secondary | ICD-10-CM | POA: Diagnosis not present

## 2019-12-11 DIAGNOSIS — Z6834 Body mass index (BMI) 34.0-34.9, adult: Secondary | ICD-10-CM

## 2019-12-11 DIAGNOSIS — R627 Adult failure to thrive: Secondary | ICD-10-CM | POA: Diagnosis not present

## 2019-12-11 DIAGNOSIS — I5032 Chronic diastolic (congestive) heart failure: Secondary | ICD-10-CM | POA: Diagnosis present

## 2019-12-11 DIAGNOSIS — K5730 Diverticulosis of large intestine without perforation or abscess without bleeding: Secondary | ICD-10-CM | POA: Diagnosis present

## 2019-12-11 DIAGNOSIS — M4628 Osteomyelitis of vertebra, sacral and sacrococcygeal region: Secondary | ICD-10-CM | POA: Diagnosis present

## 2019-12-11 DIAGNOSIS — R4020 Unspecified coma: Secondary | ICD-10-CM | POA: Diagnosis not present

## 2019-12-11 DIAGNOSIS — Z20822 Contact with and (suspected) exposure to covid-19: Secondary | ICD-10-CM | POA: Diagnosis not present

## 2019-12-11 DIAGNOSIS — R0902 Hypoxemia: Secondary | ICD-10-CM | POA: Diagnosis not present

## 2019-12-11 DIAGNOSIS — Z7189 Other specified counseling: Secondary | ICD-10-CM

## 2019-12-11 DIAGNOSIS — R000 Tachycardia, unspecified: Secondary | ICD-10-CM | POA: Diagnosis not present

## 2019-12-11 DIAGNOSIS — A419 Sepsis, unspecified organism: Secondary | ICD-10-CM | POA: Diagnosis not present

## 2019-12-11 DIAGNOSIS — D638 Anemia in other chronic diseases classified elsewhere: Secondary | ICD-10-CM

## 2019-12-11 DIAGNOSIS — Z8616 Personal history of COVID-19: Secondary | ICD-10-CM

## 2019-12-11 DIAGNOSIS — R1310 Dysphagia, unspecified: Secondary | ICD-10-CM | POA: Diagnosis not present

## 2019-12-11 DIAGNOSIS — E876 Hypokalemia: Secondary | ICD-10-CM

## 2019-12-11 DIAGNOSIS — G9341 Metabolic encephalopathy: Secondary | ICD-10-CM | POA: Diagnosis not present

## 2019-12-11 DIAGNOSIS — E1169 Type 2 diabetes mellitus with other specified complication: Secondary | ICD-10-CM | POA: Diagnosis present

## 2019-12-11 DIAGNOSIS — N200 Calculus of kidney: Secondary | ICD-10-CM | POA: Diagnosis not present

## 2019-12-11 DIAGNOSIS — Z6831 Body mass index (BMI) 31.0-31.9, adult: Secondary | ICD-10-CM

## 2019-12-11 DIAGNOSIS — I10 Essential (primary) hypertension: Secondary | ICD-10-CM | POA: Diagnosis not present

## 2019-12-11 DIAGNOSIS — N2581 Secondary hyperparathyroidism of renal origin: Secondary | ICD-10-CM | POA: Diagnosis not present

## 2019-12-11 DIAGNOSIS — R9431 Abnormal electrocardiogram [ECG] [EKG]: Secondary | ICD-10-CM | POA: Diagnosis not present

## 2019-12-11 DIAGNOSIS — E872 Acidosis: Secondary | ICD-10-CM | POA: Diagnosis present

## 2019-12-11 DIAGNOSIS — L89626 Pressure-induced deep tissue damage of left heel: Secondary | ICD-10-CM | POA: Diagnosis not present

## 2019-12-11 DIAGNOSIS — I132 Hypertensive heart and chronic kidney disease with heart failure and with stage 5 chronic kidney disease, or end stage renal disease: Secondary | ICD-10-CM | POA: Diagnosis present

## 2019-12-11 DIAGNOSIS — R6521 Severe sepsis with septic shock: Secondary | ICD-10-CM | POA: Diagnosis not present

## 2019-12-11 DIAGNOSIS — M869 Osteomyelitis, unspecified: Secondary | ICD-10-CM | POA: Diagnosis not present

## 2019-12-11 DIAGNOSIS — Z4682 Encounter for fitting and adjustment of non-vascular catheter: Secondary | ICD-10-CM | POA: Diagnosis not present

## 2019-12-11 DIAGNOSIS — N390 Urinary tract infection, site not specified: Secondary | ICD-10-CM | POA: Diagnosis not present

## 2019-12-11 DIAGNOSIS — M4620 Osteomyelitis of vertebra, site unspecified: Secondary | ICD-10-CM | POA: Diagnosis not present

## 2019-12-11 DIAGNOSIS — Z452 Encounter for adjustment and management of vascular access device: Secondary | ICD-10-CM | POA: Diagnosis not present

## 2019-12-11 DIAGNOSIS — Z4659 Encounter for fitting and adjustment of other gastrointestinal appliance and device: Secondary | ICD-10-CM

## 2019-12-11 DIAGNOSIS — R279 Unspecified lack of coordination: Secondary | ICD-10-CM | POA: Diagnosis not present

## 2019-12-11 DIAGNOSIS — G061 Intraspinal abscess and granuloma: Secondary | ICD-10-CM | POA: Diagnosis not present

## 2019-12-11 DIAGNOSIS — E871 Hypo-osmolality and hyponatremia: Secondary | ICD-10-CM | POA: Diagnosis not present

## 2019-12-11 DIAGNOSIS — E785 Hyperlipidemia, unspecified: Secondary | ICD-10-CM | POA: Diagnosis present

## 2019-12-11 DIAGNOSIS — N186 End stage renal disease: Secondary | ICD-10-CM | POA: Diagnosis not present

## 2019-12-11 DIAGNOSIS — Z841 Family history of disorders of kidney and ureter: Secondary | ICD-10-CM

## 2019-12-11 DIAGNOSIS — R5381 Other malaise: Secondary | ICD-10-CM | POA: Diagnosis not present

## 2019-12-11 DIAGNOSIS — Z992 Dependence on renal dialysis: Secondary | ICD-10-CM | POA: Diagnosis not present

## 2019-12-11 DIAGNOSIS — Z8249 Family history of ischemic heart disease and other diseases of the circulatory system: Secondary | ICD-10-CM | POA: Diagnosis not present

## 2019-12-11 DIAGNOSIS — E039 Hypothyroidism, unspecified: Secondary | ICD-10-CM | POA: Diagnosis not present

## 2019-12-11 DIAGNOSIS — Z981 Arthrodesis status: Secondary | ICD-10-CM

## 2019-12-11 DIAGNOSIS — A4159 Other Gram-negative sepsis: Principal | ICD-10-CM | POA: Diagnosis present

## 2019-12-11 DIAGNOSIS — K439 Ventral hernia without obstruction or gangrene: Secondary | ICD-10-CM | POA: Diagnosis present

## 2019-12-11 DIAGNOSIS — I5033 Acute on chronic diastolic (congestive) heart failure: Secondary | ICD-10-CM | POA: Diagnosis not present

## 2019-12-11 DIAGNOSIS — E669 Obesity, unspecified: Secondary | ICD-10-CM | POA: Diagnosis present

## 2019-12-11 DIAGNOSIS — E1122 Type 2 diabetes mellitus with diabetic chronic kidney disease: Secondary | ICD-10-CM | POA: Diagnosis present

## 2019-12-11 DIAGNOSIS — Z79890 Hormone replacement therapy (postmenopausal): Secondary | ICD-10-CM

## 2019-12-11 DIAGNOSIS — Z743 Need for continuous supervision: Secondary | ICD-10-CM | POA: Diagnosis not present

## 2019-12-11 DIAGNOSIS — K760 Fatty (change of) liver, not elsewhere classified: Secondary | ICD-10-CM | POA: Diagnosis present

## 2019-12-11 DIAGNOSIS — Z931 Gastrostomy status: Secondary | ICD-10-CM | POA: Diagnosis not present

## 2019-12-11 DIAGNOSIS — D696 Thrombocytopenia, unspecified: Secondary | ICD-10-CM | POA: Diagnosis present

## 2019-12-11 DIAGNOSIS — I700 Atherosclerosis of aorta: Secondary | ICD-10-CM | POA: Diagnosis present

## 2019-12-11 DIAGNOSIS — M1990 Unspecified osteoarthritis, unspecified site: Secondary | ICD-10-CM | POA: Diagnosis not present

## 2019-12-11 DIAGNOSIS — L89154 Pressure ulcer of sacral region, stage 4: Secondary | ICD-10-CM | POA: Diagnosis present

## 2019-12-11 DIAGNOSIS — Z832 Family history of diseases of the blood and blood-forming organs and certain disorders involving the immune mechanism: Secondary | ICD-10-CM

## 2019-12-11 DIAGNOSIS — G92 Toxic encephalopathy: Secondary | ICD-10-CM | POA: Diagnosis present

## 2019-12-11 DIAGNOSIS — E1129 Type 2 diabetes mellitus with other diabetic kidney complication: Secondary | ICD-10-CM | POA: Diagnosis not present

## 2019-12-11 DIAGNOSIS — J811 Chronic pulmonary edema: Secondary | ICD-10-CM | POA: Diagnosis not present

## 2019-12-11 DIAGNOSIS — L89300 Pressure ulcer of unspecified buttock, unstageable: Secondary | ICD-10-CM | POA: Diagnosis not present

## 2019-12-11 DIAGNOSIS — E890 Postprocedural hypothyroidism: Secondary | ICD-10-CM | POA: Diagnosis present

## 2019-12-11 DIAGNOSIS — D631 Anemia in chronic kidney disease: Secondary | ICD-10-CM | POA: Diagnosis present

## 2019-12-11 DIAGNOSIS — Z515 Encounter for palliative care: Secondary | ICD-10-CM

## 2019-12-11 DIAGNOSIS — Z431 Encounter for attention to gastrostomy: Secondary | ICD-10-CM | POA: Diagnosis not present

## 2019-12-11 DIAGNOSIS — M898X9 Other specified disorders of bone, unspecified site: Secondary | ICD-10-CM | POA: Diagnosis present

## 2019-12-11 DIAGNOSIS — R55 Syncope and collapse: Secondary | ICD-10-CM | POA: Diagnosis not present

## 2019-12-11 DIAGNOSIS — M6281 Muscle weakness (generalized): Secondary | ICD-10-CM | POA: Diagnosis not present

## 2019-12-11 DIAGNOSIS — R7881 Bacteremia: Secondary | ICD-10-CM | POA: Diagnosis not present

## 2019-12-11 DIAGNOSIS — R404 Transient alteration of awareness: Secondary | ICD-10-CM | POA: Diagnosis not present

## 2019-12-11 DIAGNOSIS — R2681 Unsteadiness on feet: Secondary | ICD-10-CM | POA: Diagnosis not present

## 2019-12-11 DIAGNOSIS — Z888 Allergy status to other drugs, medicaments and biological substances status: Secondary | ICD-10-CM

## 2019-12-11 DIAGNOSIS — R4182 Altered mental status, unspecified: Secondary | ICD-10-CM | POA: Diagnosis not present

## 2019-12-11 DIAGNOSIS — F0390 Unspecified dementia without behavioral disturbance: Secondary | ICD-10-CM | POA: Diagnosis present

## 2019-12-11 DIAGNOSIS — Z833 Family history of diabetes mellitus: Secondary | ICD-10-CM

## 2019-12-11 DIAGNOSIS — Z91011 Allergy to milk products: Secondary | ICD-10-CM

## 2019-12-11 DIAGNOSIS — Z79899 Other long term (current) drug therapy: Secondary | ICD-10-CM

## 2019-12-11 DIAGNOSIS — R0689 Other abnormalities of breathing: Secondary | ICD-10-CM | POA: Diagnosis not present

## 2019-12-11 DIAGNOSIS — I959 Hypotension, unspecified: Secondary | ICD-10-CM | POA: Diagnosis not present

## 2019-12-11 DIAGNOSIS — Z91013 Allergy to seafood: Secondary | ICD-10-CM

## 2019-12-11 DIAGNOSIS — I509 Heart failure, unspecified: Secondary | ICD-10-CM | POA: Diagnosis not present

## 2019-12-11 DIAGNOSIS — G9340 Encephalopathy, unspecified: Secondary | ICD-10-CM | POA: Diagnosis present

## 2019-12-11 DIAGNOSIS — N184 Chronic kidney disease, stage 4 (severe): Secondary | ICD-10-CM | POA: Diagnosis not present

## 2019-12-11 DIAGNOSIS — R633 Feeding difficulties: Secondary | ICD-10-CM | POA: Diagnosis not present

## 2019-12-11 LAB — CBC WITH DIFFERENTIAL/PLATELET
Abs Immature Granulocytes: 0.25 10*3/uL — ABNORMAL HIGH (ref 0.00–0.07)
Basophils Absolute: 0.1 10*3/uL (ref 0.0–0.1)
Basophils Relative: 1 %
Eosinophils Absolute: 0 10*3/uL (ref 0.0–0.5)
Eosinophils Relative: 0 %
HCT: 29.4 % — ABNORMAL LOW (ref 36.0–46.0)
Hemoglobin: 8.3 g/dL — ABNORMAL LOW (ref 12.0–15.0)
Immature Granulocytes: 2 %
Lymphocytes Relative: 7 %
Lymphs Abs: 0.9 10*3/uL (ref 0.7–4.0)
MCH: 27 pg (ref 26.0–34.0)
MCHC: 28.2 g/dL — ABNORMAL LOW (ref 30.0–36.0)
MCV: 95.8 fL (ref 80.0–100.0)
Monocytes Absolute: 0.4 10*3/uL (ref 0.1–1.0)
Monocytes Relative: 4 %
Neutro Abs: 9.9 10*3/uL — ABNORMAL HIGH (ref 1.7–7.7)
Neutrophils Relative %: 86 %
Platelets: 141 10*3/uL — ABNORMAL LOW (ref 150–400)
RBC: 3.07 MIL/uL — ABNORMAL LOW (ref 3.87–5.11)
RDW: 21.2 % — ABNORMAL HIGH (ref 11.5–15.5)
Smear Review: NORMAL
WBC: 11.5 10*3/uL — ABNORMAL HIGH (ref 4.0–10.5)
nRBC: 4.1 % — ABNORMAL HIGH (ref 0.0–0.2)

## 2019-12-11 LAB — TROPONIN I (HIGH SENSITIVITY): Troponin I (High Sensitivity): 56 ng/L — ABNORMAL HIGH (ref <18)

## 2019-12-11 LAB — URINALYSIS, ROUTINE W REFLEX MICROSCOPIC
Bacteria, UA: NONE SEEN
Bilirubin Urine: NEGATIVE
Glucose, UA: 50 mg/dL — AB
Ketones, ur: NEGATIVE mg/dL
Nitrite: NEGATIVE
Protein, ur: >=300 mg/dL — AB
Specific Gravity, Urine: 1.011 (ref 1.005–1.030)
WBC, UA: >50 WBC/hpf — ABNORMAL HIGH (ref 0–5)
pH: 7 (ref 5.0–8.0)

## 2019-12-11 LAB — COMPREHENSIVE METABOLIC PANEL
ALT: 23 U/L (ref 0–44)
AST: 30 U/L (ref 15–41)
Albumin: 1.5 g/dL — ABNORMAL LOW (ref 3.5–5.0)
Alkaline Phosphatase: 90 U/L (ref 38–126)
Anion gap: 10 (ref 5–15)
BUN: 20 mg/dL (ref 8–23)
CO2: 26 mmol/L (ref 22–32)
Calcium: 7.9 mg/dL — ABNORMAL LOW (ref 8.9–10.3)
Chloride: 102 mmol/L (ref 98–111)
Creatinine, Ser: 2.27 mg/dL — ABNORMAL HIGH (ref 0.44–1.00)
GFR calc Af Amer: 24 mL/min — ABNORMAL LOW (ref >60)
GFR calc non Af Amer: 20 mL/min — ABNORMAL LOW (ref >60)
Glucose, Bld: 137 mg/dL — ABNORMAL HIGH (ref 70–99)
Potassium: 2.5 mmol/L — CL (ref 3.5–5.1)
Sodium: 138 mmol/L (ref 135–145)
Total Bilirubin: 0.4 mg/dL (ref 0.3–1.2)
Total Protein: 5.1 g/dL — ABNORMAL LOW (ref 6.5–8.1)

## 2019-12-11 LAB — PROCALCITONIN: Procalcitonin: 2.84 ng/mL

## 2019-12-11 LAB — BLOOD GAS, VENOUS
Acid-Base Excess: 0.2 mmol/L (ref 0.0–2.0)
Bicarbonate: 26.9 mmol/L (ref 20.0–28.0)
FIO2: 1
O2 Saturation: 37.4 %
Patient temperature: 37
pCO2, Ven: 51 mmHg (ref 44.0–60.0)
pH, Ven: 7.33 (ref 7.250–7.430)
pO2, Ven: <31 mmHg — CL (ref 32.0–45.0)

## 2019-12-11 LAB — RESPIRATORY PANEL BY RT PCR (FLU A&B, COVID)
Influenza A by PCR: NEGATIVE
Influenza B by PCR: NEGATIVE
SARS Coronavirus 2 by RT PCR: NEGATIVE

## 2019-12-11 LAB — LACTIC ACID, PLASMA: Lactic Acid, Venous: 6.6 mmol/L (ref 0.5–1.9)

## 2019-12-11 LAB — APTT: aPTT: 29 seconds (ref 24–36)

## 2019-12-11 LAB — PHOSPHORUS: Phosphorus: <1 mg/dL — CL (ref 2.5–4.6)

## 2019-12-11 LAB — PROTIME-INR
INR: 1.6 — ABNORMAL HIGH (ref 0.8–1.2)
Prothrombin Time: 18.5 seconds — ABNORMAL HIGH (ref 11.4–15.2)

## 2019-12-11 LAB — MAGNESIUM: Magnesium: 1.9 mg/dL (ref 1.7–2.4)

## 2019-12-11 LAB — AMMONIA: Ammonia: 44 umol/L — ABNORMAL HIGH (ref 9–35)

## 2019-12-11 MED ORDER — SODIUM CHLORIDE 0.9 % IV BOLUS
1000.0000 mL | Freq: Once | INTRAVENOUS | Status: AC
  Administered 2019-12-11: 1000 mL via INTRAVENOUS

## 2019-12-11 MED ORDER — VANCOMYCIN HCL 1250 MG/250ML IV SOLN: 1250.0000 mg | INTRAVENOUS | Status: DC

## 2019-12-11 MED ORDER — NOREPINEPHRINE 4 MG/250ML-% IV SOLN
2.0000 ug/min | INTRAVENOUS | Status: DC
  Administered 2019-12-11: 2 ug/min via INTRAVENOUS
  Filled 2019-12-11: qty 250

## 2019-12-11 MED ORDER — IPRATROPIUM-ALBUTEROL 0.5-2.5 (3) MG/3ML IN SOLN: 3.0000 mL | RESPIRATORY_TRACT | Status: DC | PRN

## 2019-12-11 MED ORDER — VANCOMYCIN HCL 500 MG/100ML IV SOLN
500.0000 mg | Freq: Once | INTRAVENOUS | Status: AC
  Administered 2019-12-12: 500 mg via INTRAVENOUS
  Filled 2019-12-11: qty 100

## 2019-12-11 MED ORDER — POTASSIUM PHOSPHATES 15 MMOLE/5ML IV SOLN
30.0000 mmol | Freq: Once | INTRAVENOUS | Status: AC
  Administered 2019-12-12: 30 mmol via INTRAVENOUS
  Filled 2019-12-11: qty 10

## 2019-12-11 MED ORDER — SODIUM CHLORIDE 0.9 % IV BOLUS
500.0000 mL | Freq: Once | INTRAVENOUS | Status: AC
  Administered 2019-12-11: 500 mL via INTRAVENOUS

## 2019-12-11 MED ORDER — SODIUM CHLORIDE 0.9 % IV SOLN
2.0000 g | Freq: Once | INTRAVENOUS | Status: AC
  Administered 2019-12-11: 2 g via INTRAVENOUS
  Filled 2019-12-11: qty 2

## 2019-12-11 MED ORDER — ACETAMINOPHEN 325 MG PO TABS
650.0000 mg | ORAL_TABLET | ORAL | Status: DC | PRN
  Administered 2019-12-23: 650 mg via ORAL
  Filled 2019-12-11: qty 2

## 2019-12-11 MED ORDER — METRONIDAZOLE IN NACL 5-0.79 MG/ML-% IV SOLN
500.0000 mg | Freq: Once | INTRAVENOUS | Status: AC
  Administered 2019-12-11: 22:00:00 500 mg via INTRAVENOUS
  Filled 2019-12-11: qty 100

## 2019-12-11 MED ORDER — VANCOMYCIN HCL 1500 MG/300ML IV SOLN: 1500.0000 mg | INTRAVENOUS | Status: DC

## 2019-12-11 MED ORDER — VANCOMYCIN HCL IN DEXTROSE 1-5 GM/200ML-% IV SOLN
1000.0000 mg | Freq: Once | INTRAVENOUS | Status: AC
  Administered 2019-12-11: 1000 mg via INTRAVENOUS
  Filled 2019-12-11: qty 200

## 2019-12-11 MED ORDER — SODIUM CHLORIDE 0.9 % IV SOLN
250.0000 mL | INTRAVENOUS | Status: DC
  Administered 2019-12-12 – 2019-12-25 (×3): 250 mL via INTRAVENOUS

## 2019-12-11 MED ORDER — SODIUM CHLORIDE 0.9 % IV SOLN
2.0000 g | INTRAVENOUS | Status: DC
  Filled 2019-12-11: qty 2

## 2019-12-11 MED ORDER — ONDANSETRON HCL 4 MG/2ML IJ SOLN: 4.0000 mg | Freq: Four times a day (QID) | INTRAMUSCULAR | Status: DC | PRN

## 2019-12-11 MED ORDER — POLYETHYLENE GLYCOL 3350 17 G PO PACK: 17.0000 g | PACK | Freq: Every day | ORAL | Status: DC | PRN

## 2019-12-11 MED ORDER — DOCUSATE SODIUM 100 MG PO CAPS: 100.0000 mg | ORAL_CAPSULE | Freq: Two times a day (BID) | ORAL | Status: DC | PRN

## 2019-12-11 MED ORDER — HEPARIN SODIUM (PORCINE) 5000 UNIT/ML IJ SOLN
5000.0000 [IU] | Freq: Three times a day (TID) | INTRAMUSCULAR | Status: DC
  Administered 2019-12-12 – 2019-12-17 (×19): 5000 [IU] via SUBCUTANEOUS
  Filled 2019-12-11 (×19): qty 1

## 2019-12-11 MED ORDER — NOREPINEPHRINE 4 MG/250ML-% IV SOLN
INTRAVENOUS | Status: AC
  Filled 2019-12-11: qty 250

## 2019-12-11 NOTE — ED Provider Notes (Signed)
[facility] Emergency Department Provider Note ____________________________________________   First MD Initiated Contact with Patient 12/11/19 1954     (approximate)  I have reviewed the triage vital signs and the nursing notes.   HISTORY  Chief Complaint Code Sepsis  EM caveat unresponsive  HPI Taylor Tyler is a 79 y.o. female   here for evaluation of severe weakness and fever  EMS reports possible sepsis with fever concerns for possible infection.  Discussed with the patient's son, reports he saw her a couple hours ago and she seemed generally weak seem to be getting more fatigued throughout a week's time.  She seemed very tired today which they attributed to her having visitors.  But however she was able to converse and was conversant when I last saw her a few hours ago.  She does have a known ulcer on her back, and a history of previous infections, dialysis, chronic kidney disease, edema  Patient is full code  Past Medical History:  Diagnosis Date  . Acute blood loss anemia 03/14/2017  . Acute renal failure superimposed on stage 3 chronic kidney disease ([place]) 03/14/2017  . AKI (acute kidney injury) ([place]) 02/28/2017  . Altered mental status 03/29/2017  . Anemia   . Aortic atherosclerosis ([place])   . Arthritis    "joints might ache at times; not that bad" (06/10/2018)  . Bilateral lower extremity edema 03/04/2017  . Bradycardia   . Chronic kidney disease    ?? renal insufficiency,   . CKD (chronic kidney disease) stage 3, GFR 30-59 ml/min 01/15/2017   ?? renal insufficiency, which she thinks is coming from "all these medications"  . COVID-19    10/17/19  . Dementia ([place])   . Diabetes mellitus without complication ([place])    diagnosed 4-5 yrs ago, 06/21/18- "that was years ago"  . Disease of pancreas   . Diverticulitis    s/p perforation and partial colectomy 01/27/14 with 3 benign lymph nodes   . Diverticulosis 01/15/2017  . DJD (degenerative joint  disease)   . DM (diabetes mellitus), type 2 with renal complications ([place]) 1/74/9449  . Elevated ferritin level   . Fatty liver   . Hypertension   . Hypothyroidism    "had radiation" (06/10/2018)  . Kidney disease, chronic, stage V (GFR under 15 ml/min) ([place])    per notes from nursing home-she is on dialysis  . Kidney stone   . Lethargy 02/28/2017  . Obesity, Class III, BMI 40-49.9 (morbid obesity) ([place]) 02/27/2017  . Pleural lipoma   . Postoperative wound infection 04/02/2017  . Spinal stenosis of lumbar region 01/15/2017  . Status post lumbar surgery   . Wound healing, delayed    back    Patient Active Problem List   Diagnosis Date Noted  . Encephalopathy acute 12/11/2019  . Wound infection 11/21/2019  . Leukocytosis 11/19/2019  . Acute metabolic encephalopathy 67/59/1638  . Dementia without behavioral disturbance ([place]) 11/03/2019  . History of anemia due to chronic kidney disease 11/03/2019  . ESRD (end stage renal disease) ([place]) 07/22/2019  . Fever in adult   . Pressure injury of skin 06/08/2019  . Acute CHF (congestive heart failure) ([place]) 05/30/2019  . Infection and inflammatory reaction due to internal fixation device of spine, initial encounter ([place]) 06/05/2018  . Neuropathy 01/08/2018  . Fatty liver 01/05/2018  . Chronic pain of both knees 01/05/2018  . Ventral hernia 01/05/2018  . CKD (chronic kidney disease) stage 4, GFR 15-29 ml/min (H[facility]) 07/21/2017  .  Vitamin D deficiency 07/21/2017  . Essential hypertension   . Postoperative wound infection 04/02/2017  . Anemia 03/14/2017  . Acute blood loss anemia 03/14/2017  . Acute renal failure superimposed on stage 3 chronic kidney disease ([place]) 03/14/2017  . Bilateral leg edema 03/04/2017  . Obesity, Class III, BMI 40-49.9 (morbid obesity) ([place]) 02/27/2017  . DM (diabetes mellitus), type 2 with renal complications ([place]) 67/20/9470  . Status post lumbar surgery 02/24/2017  . CKD (chronic kidney disease) stage 3, GFR 30-59  ml/min 01/15/2017  . Hypothyroidism 01/15/2017  . Diverticulosis 01/15/2017  . HLD (hyperlipidemia) 01/15/2017  . Spinal stenosis of lumbar region 01/15/2017  . Disease of pancreas 07/21/2012    Past Surgical History:  Procedure Laterality Date  . ABDOMINAL EXPOSURE N/A 02/24/2017   Procedure: ABDOMINAL EXPOSURE;  Surgeon: Angelia Mould, MD;  Location: [place];  Service: Vascular;  Laterality: N/A;  . ABDOMINAL HYSTERECTOMY  1987   no h/o abnormal paps   . ANTERIOR LAT LUMBAR FUSION N/A 02/24/2017   Procedure: Lumbar three- five Anterior lateral lumbar interbody fusion;  Surgeon: Ditty, Kevan Ny, MD;  Location: [place];  Service: Neurosurgery;  Laterality: N/A;  L3-5 Anterior lateral lumbar interbody fusion with removal of coflex at L3-4, L4-5  . ANTERIOR LUMBAR FUSION N/A 02/24/2017   Procedure: Stage 1: Lumbar five-Sacral one Anterior lumbar interbody fusion;  Surgeon: Ditty, Kevan Ny, MD;  Location: [place];  Service: Neurosurgery;  Laterality: N/A;  Stage 1: L5-S1 Anterior lumbar interbody fusion  . APPENDECTOMY    . APPLICATION OF ROBOTIC ASSISTANCE FOR SPINAL PROCEDURE N/A 02/26/2017   Procedure: APPLICATION OF ROBOTIC ASSISTANCE FOR SPINAL PROCEDURE;  Surgeon: Ditty, Kevan Ny, MD;  Location: [place];  Service: Neurosurgery;  Laterality: N/A;  . APPLICATION OF WOUND VAC N/A 03/30/2017   Procedure: APPLICATION OF WOUND VAC;  Surgeon: Ditty, Kevan Ny, MD;  Location: [place];  Service: Neurosurgery;  Laterality: N/A;  . APPLICATION OF WOUND VAC N/A 06/23/2019   Procedure: APPLICATION OF WOUND VAC;  Surgeon: Consuella Lose, MD;  Location: [place];  Service: Neurosurgery;  Laterality: N/A;  . AV FISTULA PLACEMENT Left 06/08/2019   Procedure: Arteriovenous (Av) Fistula Creation;  Surgeon: Rosetta Posner, MD;  Location: [facility];  Service: Vascular;  Laterality: Left;  . BACK SURGERY  2013  . CATARACT EXTRACTION W/ INTRAOCULAR LENS IMPLANT Right   . CHOLECYSTECTOMY OPEN   1982  . COLON SURGERY  01/26/2014   desc.sigmoid colectomy and ventral hernia repair and splenic flexure mobilization  . COLON SURGERY    . DILATION AND CURETTAGE OF UTERUS    . HERNIA REPAIR    . INSERTION OF DIALYSIS CATHETER Right 06/08/2019   Procedure: INSERTION OF DIALYSIS CATHETER RIGHT INTERNAL JUGULAR;  Surgeon: Rosetta Posner, MD;  Location: [facility];  Service: Vascular;  Laterality: Right;  . LUMBAR LAMINECTOMY WITH SPINOUS PROCESS PLATE 2 LEVEL N/A 9/62/8366   Procedure: LUMBAR LAMINECTOMY/DECOMPRESSION MICRODISCECTOMY CoFlex;  Surgeon: Faythe Ghee, MD;  [facility]: [facility];  Service: Neurosurgery;  Laterality: N/A;  Lumbar three-four,Lumbar Four-Five Laminectomy with Coflex  . LUMBAR WOUND DEBRIDEMENT N/A 03/30/2017   Procedure: Lumbar wound exploration/debridement, placement of wound vac;  Surgeon: Ditty, Kevan Ny, MD;  Location: [facility];  Service: Neurosurgery;  Laterality: N/A;  Lumbar wound exploration/debridement, placement of wound vac  . LUMBAR WOUND DEBRIDEMENT N/A 06/06/2018   Procedure: LUMBAR WOUND DEBRIDEMENT/EXPLORATION;  Surgeon: Consuella Lose, MD;  Location: [facility];  Service: Neurosurgery;  Laterality: N/A;  .  LUMBAR WOUND DEBRIDEMENT N/A 06/22/2018   Procedure: SIMPLE INCISION AND DRAINAGE OF WOUND, APPLICATION OF WOUND VAC;  Surgeon: Consuella Lose, MD;  Location: [facility];  Service: Neurosurgery;  Laterality: N/A;  . LUMBAR WOUND DEBRIDEMENT N/A 06/23/2019   Procedure: LUMBAR WOUND DEBRIDEMENT WITH HARDWARE REMOVAL;  Surgeon: Consuella Lose, MD;  Location: [facility];  Service: Neurosurgery;  Laterality: N/A;  . PARTIAL COLECTOMY     01/27/14 diverticulitis and 3 benign lymph nodes [facility] Dr. Pat Patrick   . REDUCTION MAMMAPLASTY Bilateral 1992  . THROMBECTOMY BRACHIAL ARTERY Left 06/08/2019   Procedure: Thrombectomy Brachial Artery;  Surgeon: Rosetta Posner, MD;  Location: [facility];  Service: Vascular;  Laterality: Left;  . TUBAL LIGATION    . VENTRAL HERNIA REPAIR   2014    Prior to Admission medications   Medication Sig Start Date End Date Taking? Authorizing Provider  acetaminophen (TYLENOL) 500 MG tablet Take 500 mg by mouth daily as needed for mild pain.    Yes [provider]  atorvastatin (LIPITOR) 40 MG tablet Take 1 tablet (40 mg total) by mouth daily at 6 PM. 09/03/18  Yes McLean-Scocuzza, Nino Glow, MD  benzonatate (TESSALON PERLES) 100 MG capsule Take 1 capsule (100 mg total) by mouth 3 (three) times daily as needed for cough. 08/07/19 08/06/20 Yes Earleen Newport, MD  citalopram (CELEXA) 10 MG tablet Take 10 mg by mouth daily.   Yes [provider]  collagenase (SANTYL) ointment Apply topically daily. 11/24/19  Yes Nicole Kindred A, DO  divalproex (DEPAKOTE SPRINKLE) 125 MG capsule Take 125 mg by mouth 2 (two) times daily.    Yes [provider]  docusate sodium (COLACE) 100 MG capsule Take 100 mg by mouth 2 (two) times daily.   Yes [provider]  donepezil (ARICEPT) 5 MG tablet Take 5 mg by mouth at bedtime.   Yes [provider]  epoetin alfa (EPOGEN) 4000 UNIT/ML injection Inject 1 mL (4,000 Units total) into the skin Every Tuesday,Thursday,and Saturday with dialysis. 11/09/19  Yes Enzo Bi, MD  gabapentin (NEURONTIN) 100 MG capsule Take 2 capsules (200 mg total) by mouth at bedtime. 07/04/19  Yes Regalado, Belkys A, MD  labetalol (NORMODYNE) 100 MG tablet Take 1 tablet (100 mg total) by mouth 2 (two) times daily. 07/08/19  Yes Irene Pap N, DO  Lactobacillus (ACIDOPHILUS PO) Take 175 mg by mouth 2 (two) times daily.   Yes [provider]  lactulose, encephalopathy, (GENERLAC) 10 GM/15ML SOLN Take 10 g by mouth daily.   Yes [provider]  levothyroxine (SYNTHROID, LEVOTHROID) 100 MCG tablet Take 100 mcg by mouth daily before breakfast.   Yes [provider]  meclizine (ANTIVERT) 12.5 MG tablet Take 1 tablet (12.5 mg total) by mouth daily as needed for dizziness. 11/09/19   Yes Enzo Bi, MD  memantine (NAMENDA) 5 MG tablet Take 5 mg by mouth 2 (two) times daily.   Yes [provider]  Nutritional Supplements (FEEDING SUPPLEMENT, NEPRO CARB STEADY,) LIQD Take 237 mLs by mouth daily.   Yes [provider]  risperiDONE (RISPERDAL) 0.25 MG tablet Take 0.25 mg by mouth 2 (two) times daily.   Yes [provider]  sevelamer carbonate (RENVELA) 800 MG tablet Take 1 tablet (800 mg total) by mouth 3 (three) times daily with meals. 07/04/19  Yes Regalado, Belkys A, MD  traMADol (ULTRAM) 50 MG tablet Take 25 mg by mouth every 6 (six) hours as needed for moderate pain.    Yes  [provider]  zinc oxide 20 % ointment Apply 1 application topically in the morning, at noon, and at bedtime.   Yes [provider]  folic acid (FOLVITE) 478 MCG tablet Take 400 mcg by mouth daily.     [provider]    Allergies Clams [shellfish allergy], Hydralazine, Norvasc [amlodipine besylate], and Milk-related compounds  Family History  Problem Relation Age of Onset  . Diabetes Father   . Heart attack Father   . Hypertension Father   . Heart disease Father   . Kidney disease Father   . Diabetes Mother   . Hypertension Mother   . Heart disease Mother   . Kidney disease Mother   . Lupus Sister   . Heart disease Brother   . Diabetes Brother   . Heart disease Brother   . Diabetes Brother   . Lupus Sister     Social History Social History   Tobacco Use  . Smoking status: Never Smoker  . Smokeless tobacco: Never Used  Substance Use Topics  . Alcohol use: Not Currently    Comment: "nothing since age 59" (06/10/2018)  . Drug use: Never    Review of Systems  EM caveat   ____________________________________________   PHYSICAL EXAM:  VITAL SIGNS: ED Triage Vitals  Enc Vitals Group     BP 12/11/19 1947 (!) 101/44     Pulse Rate 12/11/19 1947 79     Resp 12/11/19 1947 (!) 34     Temp 12/11/19 2014 (!) 101.2 F (38.4  C)     Temp Source 12/11/19 2014 Axillary     SpO2 --      Weight 12/11/19 1944 165 lb 5.5 oz (75 kg)     Height 12/11/19 1944 5\' 6"  (1.676 m)     Head Circumference --      Peak Flow --      Pain Score --      Pain Loc --      Pain Edu? --      Excl. in [place]? --     Constitutional: Very lethargic, responds to stimuli.  Closes once his eyes but does not open spontaneously.  Appears very lethargic somnolent Eyes: Conjunctivae are normal. Head: Atraumatic. Nose: No congestion/rhinnorhea. Mouth/Throat: Mucous membranes are dry. Neck: No stridor.  Cardiovascular: Normal rate, regular rhythm. Grossly normal heart sounds.  Good peripheral circulation. Respiratory: Normal respiratory effort.  No retractions. Lungs CTAB. Gastrointestinal: Soft and nontender. No distention.  Large anterior hiatal hernia appears soft and nontender.  No notable abdominal tenderness but patient altered mental status Musculoskeletal: No lower extremity tenderness bilateral lower extremity edema.  Wound on back Neurologic: Lethargic.  Borderline obtunded.  Does not close and open eyes somewhat to pain, withdraws to pain in extremities.  Moans occasionally.  Clenches jaw off and on.  Intact gag. Skin:  Skin is warm, dry and intact. No rash noted. Psychiatric: Mood and affect are unable to be assessed  ____________________________________________   LABS (all labs ordered are listed, but only abnormal results are displayed)  Labs Reviewed  LACTIC ACID, PLASMA - Abnormal; Notable for the following components:      Result Value   Lactic Acid, Venous 6.6 (*)    All other components within normal limits  COMPREHENSIVE METABOLIC PANEL - Abnormal; Notable for the following components:   Potassium 2.5 (*)    Glucose, Bld 137 (*)    Creatinine, Ser 2.27 (*)    Calcium 7.9 (*)  Total Protein 5.1 (*)    Albumin 1.5 (*)    GFR calc non Af Amer 20 (*)    GFR calc Af Amer 24 (*)    All other components within normal  limits  PROTIME-INR - Abnormal; Notable for the following components:   Prothrombin Time 18.5 (*)    INR 1.6 (*)    All other components within normal limits  URINALYSIS, ROUTINE W REFLEX MICROSCOPIC - Abnormal; Notable for the following components:   Color, Urine AMBER (*)    APPearance TURBID (*)    Glucose, UA 50 (*)    Hgb urine dipstick SMALL (*)    Protein, ur >=300 (*)    Leukocytes,Ua MODERATE (*)    WBC, UA >50 (*)    All other components within normal limits  BLOOD GAS, VENOUS - Abnormal; Notable for the following components:   pO2, Ven <31.0 (*)    All other components within normal limits  AMMONIA - Abnormal; Notable for the following components:   Ammonia 44 (*)    All other components within normal limits  CBC WITH DIFFERENTIAL/PLATELET - Abnormal; Notable for the following components:   WBC 11.5 (*)    RBC 3.07 (*)    Hemoglobin 8.3 (*)    HCT 29.4 (*)    MCHC 28.2 (*)    RDW 21.2 (*)    Platelets 141 (*)    nRBC 4.1 (*)    Neutro Abs 9.9 (*)    Abs Immature Granulocytes 0.25 (*)    All other components within normal limits  TROPONIN I (HIGH SENSITIVITY) - Abnormal; Notable for the following components:   Troponin I (High Sensitivity) 56 (*)    All other components within normal limits  RESPIRATORY PANEL BY RT PCR (FLU A&B, COVID)  CULTURE, BLOOD (ROUTINE X 2)  CULTURE, BLOOD (ROUTINE X 2)  URINE CULTURE  AEROBIC/ANAEROBIC CULTURE (SURGICAL/DEEP WOUND)  APTT  PROCALCITONIN  CBC WITH DIFFERENTIAL/PLATELET  PROCALCITONIN  PATHOLOGIST SMEAR REVIEW  LACTIC ACID, PLASMA  LACTIC ACID, PLASMA  PHOSPHORUS  MAGNESIUM  CBG MONITORING, ED   ____________________________________________  EKG  Reviewed inter by me at 1950 Heart rate 75 QRS 100 QTc 530 Normal sinus rhythm, mild prolongation of QT interval.  No evidence of acute ischemia ____________________________________________  RADIOLOGY  CT ABDOMEN PELVIS WO CONTRAST  Result Date:  12/11/2019 CLINICAL DATA:  Unresponsive. EXAM: CT ABDOMEN AND PELVIS WITHOUT CONTRAST TECHNIQUE: Multidetector CT imaging of the abdomen and pelvis was performed following the standard protocol without IV contrast. COMPARISON:  March 29, 2017 FINDINGS: Lower chest: No acute abnormality. Hepatobiliary: No focal liver abnormality is seen. No gallstones, gallbladder wall thickening, or biliary dilatation. Pancreas: Unremarkable. No pancreatic ductal dilatation or surrounding inflammatory changes. Spleen: Normal in size without focal abnormality. Adrenals/Urinary Tract: Adrenal glands are unremarkable. Kidneys are normal in size, without focal lesions or hydronephrosis. A 6 mm nonobstructing renal stone is seen within the lower pole of the left kidney. A Foley catheter is seen within the urinary bladder. Stomach/Bowel: There is a small hiatal hernia. The appendix is not identified. Surgically anastomosed bowel is seen within the mid sigmoid colon. Noninflamed diverticula are also seen within the proximal sigmoid colon. No evidence of bowel dilatation. Vascular/Lymphatic: Moderate to marked severity aortic calcification. No enlarged abdominal or pelvic lymph nodes. Reproductive: Status post hysterectomy. No adnexal masses. Other: There is a predominant stable 11.9 cm x 3.7 cm ventral hernia seen just above the level of the umbilicus. This contains fat and  a segment of nondilated transverse colon. An additional 13.4 cm x 6.2 cm ventral hernia is seen along the anterior aspect of the lower pelvic wall. This is mildly increased in size when compared to the prior study and contains fat and multiple nondilated loops of distal small bowel. Musculoskeletal: Multilevel degenerative changes seen throughout the lumbar spine with a metallic density fusion plate and screws seen along the anterior aspect of the levels of L5 and S1. This represents a new finding when compared to the prior exam. The multiple bilateral pedicle screws  seen throughout the lumbar spine on the prior study have been removed. Additional postoperative changes are seen involving the posterior elements of the lumbar spine. A 6.2 cm x 4.5 cm area of air and fluid attenuation is seen posterior to the spinal canal at the level of the L4 vertebral body. Tiny foci of air are seen within the prevertebral soft tissues at the level of L4-L5, and within the adjacent portion of the lower left hemipelvis. These extend to the region adjacent to the mid sigmoid colon. A 3.2 cm x 3.1 cm x 2.0 cm soft tissue defect is seen along the posterior aspect of the lumbar spine at the level of L3 and L4. A 3.4 cm by 0.5 cm sacral decubitus ulcer is seen along the mid to lower sacrum, to the left of midline. A small amount of adjacent cortical destruction is seen. IMPRESSION: 1. Extensive postoperative changes within the lumbar spine, as described above, with a 6.2 cm x 4.5 cm area of air and fluid attenuation seen posterior to the spinal canal at the level of the L4 vertebral body. This may represent a postoperative collection/abscess. 2. Large, ventral hernias, as described above. 3. Sacral decubitus ulcer with additional findings suggestive of acute osteomyelitis. 4. Stable 6 mm nonobstructing renal stone within the left kidney. 5. Sigmoid diverticulosis. Aortic Atherosclerosis (ICD10-I70.0). Electronically Signed   By: Virgina Norfolk M.D.   On: 12/11/2019 22:02   CT HEAD WO CONTRAST  Result Date: 12/11/2019 CLINICAL DATA:  Unresponsive. EXAM: CT HEAD WITHOUT CONTRAST TECHNIQUE: Contiguous axial images were obtained from the base of the skull through the vertex without intravenous contrast. COMPARISON:  November 19, 2019 FINDINGS: Brain: There is mild to moderate severity cerebral atrophy with widening of the extra-axial spaces and ventricular dilatation. There are areas of decreased attenuation within the white matter tracts of the supratentorial brain, consistent with microvascular  disease changes. Vascular: No hyperdense vessel or unexpected calcification. Skull: Normal. Negative for fracture or focal lesion. Sinuses/Orbits: There is mild sphenoid sinus mucosal thickening. Other: None. IMPRESSION: 1. No acute intracranial abnormality. 2. Mild to moderate severity cerebral atrophy and microvascular disease changes of the supratentorial brain. 3. Mild sphenoid sinus disease. Electronically Signed   By: Virgina Norfolk M.D.   On: 12/11/2019 21:32   DG Chest Port 1 View  Result Date: 12/11/2019 CLINICAL DATA:  79 year old female with sepsis. EXAM: PORTABLE CHEST 1 VIEW COMPARISON:  Chest radiograph dated 11/19/2019. FINDINGS: Dialysis catheter in similar position. There is stable cardiomegaly. Probable mild vascular congestion. No edema. No focal consolidation, pleural effusion, pneumothorax. Atherosclerotic calcification of the aorta. No acute osseous pathology. Degenerative changes of the shoulders. IMPRESSION: No focal consolidation.  Probable mild vascular congestion. Electronically Signed   By: Anner Crete M.D.   On: 12/11/2019 20:33    Imaging studies reviewed, CT head negative for acute findings.  Chest x-ray no focal consolidations, probable vascular congestion  CT abdomen and pelvis with  some concerns of air and fluid levels posterior to the spinal canal at L4.  Possible osteomyelitis of the sacral decubitus ulcer.  Multiple other findings ____________________________________________   PROCEDURES  Procedure(s) performed: None  Procedures  Critical Care performed: Yes, see critical care note(s)  CRITICAL CARE Performed by: Delman Kitten   Total critical care time: 45 minutes  Critical care time was exclusive of separately billable procedures and treating other patients.  Critical care was necessary to treat or prevent imminent or life-threatening deterioration.  Critical care was time spent personally by me on the following activities: development of  treatment plan with patient and/or surrogate as well as nursing, discussions with consultants, evaluation of patient's response to treatment, examination of patient, obtaining history from patient or surrogate, ordering and performing treatments and interventions, ordering and review of laboratory studies, ordering and review of radiographic studies, pulse oximetry and re-evaluation of patient's condition.  ____________________________________________   INITIAL IMPRESSION / ASSESSMENT AND PLAN / ED COURSE  Pertinent labs & imaging results that were available during my care of the patient were reviewed by me and considered in my medical decision making (see chart for details).   Patient presents febrile with significant altered mental status somnolence.  She appears critically ill.  High concern for infection given clinical history reported by EMS including fever.  Activated prehospital is a sepsis alert by EMS.  Code sepsis initiated on ER arrival.  Antibiotics.  Search for etiology.  Differential diagnosis is broad, however does include acute vascular neurologic, intra-abdominal, pulmonary cardiac, or other event.  Based on her clinical presentation however I am most focused at this point upon likely septic shock  Clinical Course as of Dec 11 2254  Sun Dec 11, 2019  2043 Spoke and placed admit request with Beverlee Nims (NP) for CCM at this time   [MQ]  2123 Blood pressures improving.  Now normotensive on Levophed infusion, fluid resuscitation.  Patient mental status improving, alert, opening eyes spontaneously.  Currently she is in CT scan, admitting to critical care medicine.  Patient improving.  Seems to be fluid responsive at this point.  Working diagnosis severe sepsis and septic shock the differential remains a lot broad, awaiting imaging of the head 12 abdomen pelvis, significant lactic acidosis.  Hypokalemia but also known dialysis patient.   [MQ]  2217 [place] notification sent to Dr. Mortimer Fries re:  abd/plv findings of possible abcess and osteomyelitis.    [MQ]  2217 Sepsis reassessment completed at this time.  Patient improving.  Hemodynamics improving including blood pressure.  She is demonstrating evidence of fluid responsiveness.  Repeat lactic acid anticipated.  Altered mental status is improving, she is alert and able to speak no   [MQ]    Clinical Course User Index [MQ] Delman Kitten, MD   ----------------------------------------- 10:55 PM on 12/11/2019 -----------------------------------------  Patient is mental status improving.  Nursing reports the patient is now conversant.  ICU team at the bedside, had suggested a nursing to roll and examine patient back carefully but they report ICU team currently examining and obtaining cultures of area in question on imaging and examining.      ____________________________________________   FINAL CLINICAL IMPRESSION(S) / ED DIAGNOSES  Final diagnoses:  Osteomyelitis, unspecified site, unspecified type ([place])  Urinary tract infection, acute        Note:  This document was prepared using Dragon voice recognition software and may include unintentional dictation errors       Delman Kitten, MD 12/12/19 0040

## 2019-12-11 NOTE — Progress Notes (Addendum)
Pharmacy Antibiotic Note  Taylor Tyler is a 79 y.o. female admitted on 12/11/2019 with sepsis.  Pharmacy has been consulted for Vanc, Cefepime dosing.  Plan: 05/09 @ 2200 CrCl 21.8 ml/min will readjust cefepime dose to 2g IV q24h and adjust vanc regimen to vanc 1.25g IV q36h per traditional dosing d/t vanc reagent shortage.  Ke [id number] T1/2 30.8 ~ 36 hrs  Will re-evalulate need for vancomycin in the morning depending on clinical status w/ medical team and will continue to monitor and adjust per changing renal function patient w/ h/o CKD.  Height: 5\' 6"  (167.6 cm) Weight: 75 kg (165 lb 5.5 oz) IBW/kg (Calculated) : 59.3  Temp (24hrs), Avg:98.9 F (37.2 C), Min:95 F (35 C), Max:101.2 F (38.4 C)  Recent Labs  Lab 12/11/19 1945  WBC 11.5*  CREATININE 2.27*  LATICACIDVEN 6.6*    Estimated Creatinine Clearance: 21.8 mL/min (A) (by C-G formula based on SCr of 2.27 mg/dL (H)).    Allergies  Allergen Reactions  . Clams [Shellfish Allergy] Swelling and Other (See Comments)    THROAT SWELLS NECK TURNS RED  . Hydralazine Other (See Comments)    CHEST TIGHTNESS Patient has tolerated multiple doses of hydralazine since allergy was listed   . Norvasc [Amlodipine Besylate] Swelling    Leg edema   . Milk-Related Compounds Diarrhea    Thank you for allowing pharmacy to be a part of this patient's care.  Tobie Lords, PharmD, BCPS Clinical Pharmacist 12/11/2019 10:00 PM

## 2019-12-11 NOTE — Progress Notes (Signed)
Pharmacy Antibiotic Note  Taylor Tyler is a 79 y.o. female admitted on 12/11/2019 with sepsis.  Pharmacy has been consulted for Vanc, Cefepime dosing.  Plan:  Cefepime 2 gm IV X 1 given in ED on 5/9 @ 2000. Cefepime 2 gm IV Q12H ordered to start on 5/10 @ 2000.  Vancomycin 1500 mg IV X 1 give on 5/9 @ 2000. Vancomycin 1500 mg IV Q48H ordered to start on 5/11 @ 2000.  AUC = 497.1 Vanc trough = 10.3 mcg/mL Vd = 54L  Ke = 0.028 hr-1 T1/2 = 24.8 hrs     Height: 5\' 6"  (167.6 cm) Weight: 75 kg (165 lb 5.5 oz) IBW/kg (Calculated) : 59.3  Temp (24hrs), Avg:101.2 F (38.4 C), Min:101.2 F (38.4 C), Max:101.2 F (38.4 C)  Recent Labs  Lab 12/11/19 1945  WBC PENDING  LATICACIDVEN 6.6*    Estimated Creatinine Clearance: 31.4 mL/min (A) (by C-G formula based on SCr of 1.58 mg/dL (H)).    Allergies  Allergen Reactions  . Clams [Shellfish Allergy] Swelling and Other (See Comments)    THROAT SWELLS NECK TURNS RED  . Hydralazine Other (See Comments)    CHEST TIGHTNESS Patient has tolerated multiple doses of hydralazine since allergy was listed   . Norvasc [Amlodipine Besylate] Swelling    Leg edema   . Milk-Related Compounds Diarrhea    Antimicrobials this admission:   >>    >>   Dose adjustments this admission:   Microbiology results:  BCx:   UCx:    Sputum:   MRSA PCR:   Thank you for allowing pharmacy to be a part of this patient's care.  Avalynn Bowe D 12/11/2019 8:58 PM

## 2019-12-11 NOTE — H&P (Signed)
Name: Taylor Tyler MRN: [id number] DOB: [birth year]    ADMISSION DATE:  12/11/2019 CONSULTATION DATE: 12/11/2019  REFERRING MD : Dr. Jacqualine Code   CHIEF COMPLAINT: Unresponsiveness   BRIEF PATIENT DESCRIPTION:  79 yo female with ESRD on HD admitted with acute toxic metabolic encephalopathy secondary to severe sepsis in the setting of UTI, chronic nonhealing sacral decubitus ulcer CT suggestive of osteomyelitis along with L4 vertebral body postoperative collection/abscess requiring levophed gtt   SIGNIFICANT EVENTS/STUDIES:  05/9: Pt presented to [facility] ER minimally responsive and hypoxic.   05/9: Pt admitted to ICU with septic shock requiring levophed gtt with improvement in mentation and hypoxia resolved 05/9: CT Head revealed no acute intracranial abnormality. Mild to moderate severity cerebral atrophy and microvascular disease changes of the supratentorial brain. Mild sphenoid sinus disease. 05/9: CT Abd/Pelvis revealed extensive postoperative changes within the lumbar spine, as described above, with a 6.2 cm x 4.5 cm area of air and fluid attenuation seen posterior to the spinal canal at the level of the L4 vertebral body. This may represent a postoperative collection/abscess. Large, ventral hernias, as described above. Sacral decubitus ulcer with additional findings suggestive of acute osteomyelitis. Stable 6 mm nonobstructing renal stone within the left kidney. Sigmoid diverticulosis.   HISTORY OF PRESENT ILLNESS:   This is a 79 yo female with a PMH of Pleural Lipoma, Obesity, ESRD on Hemodialysis, Fatty Liver, Hypothyroidism, HTN, Type II Diabetes Mellitus, DJD, Diverticulosis, Dementia, COVID-19 (10/17/2019), Aortic Atherosclerosis, Anemia, Bradycardia, Acute Encephalopathy, Acute Blood Loss Anemia, Aortic Atherosclerosis, and Arthritis. She presented [facility] ER from [facility] on 05/9 via EMS with unresponsiveness and fevers (temp 101 F).  Per ER notes pts son reported the pt seemed more  fatigued throughout the week, but mental status worsened today.  In the ER pts O2 sats were 77%, therefore pt placed on NRB. COVID-19/Influenza PCR negative, however CXR concerning for mild vascular congestion.  Lab results revealed K+ 2.5, glucose 137, creatinine 2.27, calcium 7.9, albumin 1.5, ammonia 44, troponin 56, lactic acid 6.6, hgb 8.3, platelets 141, PT 18.5, INR 1.6, and UA positive for UTI.  Pt also noted to have sacral spine decubitus ulcers.  ER vital signs were: bp 101/44; rr 34, and temp 101.2.  Pt ruled in for sepsis she received 2L NS bolus, cefepime, flagyl, and vancomycin.  Due to continued hypotension levophed gtt initiated.  She remained minimally responsive, but she was able to protect her airway.  PCCM team contacted for ICU admission. Detailed hospital course outlined above   Significant Medical/Surgical  Hx:  02/2013: pt hadbilateral L3-4 and L4-5 decompressive laminectomy with L3-4 and L4-5 Coflex posterior instrumentation 02/2017: she hadremoval of L3-4 and L4-5 Coflex devices, re-operative bilateral L3-4 and L4-5 laminectomies with L4-5 Gill procedure, L5-S1 initial laminectomy with facetectomy, posterior segmental instrumented fusion L3-S1 06/23/2019: she underwent removal of  hardware on 06/23/2019 pt hospitalized 10/26-11/30/2020 requiring >8 weeks of IV antibiotic therapy   Pt with hx of lumbar hardware infection with proteus recurrent since 2014. See Infectious disease consult note on 11/08/2019 for additional details   PAST MEDICAL HISTORY :   has a past medical history of Acute blood loss anemia (03/14/2017), Acute renal failure superimposed on stage 3 chronic kidney disease ([place]) (03/14/2017), AKI (acute kidney injury) ([place]) (02/28/2017), Altered mental status (03/29/2017), Anemia, Aortic atherosclerosis ([place]), Arthritis, Bilateral lower extremity edema (03/04/2017), Bradycardia, Chronic kidney disease, CKD (chronic kidney disease) stage 3, GFR 30-59 ml/min (01/15/2017),  COVID-19, Dementia ([place]), Diabetes mellitus without complication ([place]),  Disease of pancreas, Diverticulitis, Diverticulosis (01/15/2017), DJD (degenerative joint disease), DM (diabetes mellitus), type 2 with renal complications ([place]) (3/78/5885), Elevated ferritin level, Fatty liver, Hypertension, Hypothyroidism, Kidney disease, chronic, stage V (GFR under 15 ml/min) ([place]), Kidney stone, Lethargy (02/28/2017), Obesity, Class III, BMI 40-49.9 (morbid obesity) ([place]) (02/27/2017), Pleural lipoma, Postoperative wound infection (04/02/2017), Spinal stenosis of lumbar region (01/15/2017), Status post lumbar surgery, and Wound healing, delayed.  has a past surgical history that includes Lumbar laminectomy with spinous process plate 2 level (N/A, 0/27/7412); Back surgery (2013); Anterior lumbar fusion (N/A, 02/24/2017); Anterior lat lumbar fusion (N/A, 02/24/2017); Abdominal exposure (N/A, 02/24/2017); Application of robotic assistance for spinal procedure (N/A, 02/26/2017); Lumbar wound debridement (N/A, 03/30/2017); Application if wound vac (N/A, 03/30/2017); Partial colectomy; Ventral hernia repair (2014); Appendectomy; Colon surgery (01/26/2014); Colon surgery; Lumbar wound debridement (N/A, 06/06/2018); Hernia repair; Cholecystectomy open (1982); Reduction mammaplasty (Bilateral, 1992); Cataract extraction w/ intraocular lens implant (Right); Abdominal hysterectomy (1987); Dilation and curettage of uterus; Tubal ligation; Lumbar wound debridement (N/A, 06/22/2018); Insertion of dialysis catheter (Right, 06/08/2019); AV fistula placement (Left, 06/08/2019); Thrombectomy brachial artery (Left, 06/08/2019); Lumbar wound debridement (N/A, 87/86/7672); and Application if wound vac (N/A, 06/23/2019). Prior to Admission medications   Medication Sig Start Date End Date Taking? Authorizing Provider  acetaminophen (TYLENOL) 500 MG tablet Take 500 mg by mouth daily as needed for mild pain.     [provider]  atorvastatin  (LIPITOR) 40 MG tablet Take 1 tablet (40 mg total) by mouth daily at 6 PM. 09/03/18   McLean-Scocuzza, Nino Glow, MD  benzonatate (TESSALON PERLES) 100 MG capsule Take 1 capsule (100 mg total) by mouth 3 (three) times daily as needed for cough. 08/07/19 08/06/20  Earleen Newport, MD  citalopram (CELEXA) 10 MG tablet Take 10 mg by mouth daily.    [provider]  collagenase (SANTYL) ointment Apply topically daily. 11/24/19   Nicole Kindred A, DO  divalproex (DEPAKOTE SPRINKLE) 125 MG capsule Take 125 mg by mouth 2 (two) times daily.     [provider]  docusate sodium (COLACE) 100 MG capsule Take 100 mg by mouth 2 (two) times daily.    [provider]  donepezil (ARICEPT) 5 MG tablet Take 5 mg by mouth at bedtime.    [provider]  epoetin alfa (EPOGEN) 4000 UNIT/ML injection Inject 1 mL (4,000 Units total) into the skin Every Tuesday,Thursday,and Saturday with dialysis. 11/09/19   Enzo Bi, MD  folic acid (FOLVITE) 094 MCG tablet Take 400 mcg by mouth daily.     [provider]  gabapentin (NEURONTIN) 100 MG capsule Take 2 capsules (200 mg total) by mouth at bedtime. 07/04/19   Regalado, Belkys A, MD  labetalol (NORMODYNE) 100 MG tablet Take 1 tablet (100 mg total) by mouth 2 (two) times daily. 07/08/19   Kayleen Memos, DO  Lactobacillus (ACIDOPHILUS PO) Take 175 mg by mouth 2 (two) times daily.    [provider]  lactulose, encephalopathy, (GENERLAC) 10 GM/15ML SOLN Take 10 g by mouth daily.    [provider]  levothyroxine (SYNTHROID, LEVOTHROID) 100 MCG tablet Take 100 mcg by mouth daily before breakfast.    [provider]  meclizine (ANTIVERT) 12.5 MG tablet Take 1 tablet (12.5 mg total) by mouth daily as needed for dizziness. 11/09/19   Enzo Bi, MD  memantine (NAMENDA) 5 MG tablet Take 5 mg by mouth 2 (two) times daily.    [provider]  Nutritional Supplements (FEEDING SUPPLEMENT, NEPRO CARB  STEADY,) LIQD  Take 237 mLs by mouth daily.    [provider]  risperiDONE (RISPERDAL) 0.25 MG tablet Take 0.25 mg by mouth 2 (two) times daily.    [provider]  sevelamer carbonate (RENVELA) 800 MG tablet Take 1 tablet (800 mg total) by mouth 3 (three) times daily with meals. 07/04/19   Regalado, Belkys A, MD  traMADol (ULTRAM) 50 MG tablet Take 25 mg by mouth every 6 (six) hours as needed for moderate pain.     [provider]  zinc oxide 20 % ointment Apply 1 application topically in the morning, at noon, and at bedtime.    [provider]   Allergies  Allergen Reactions  . Clams [Shellfish Allergy] Swelling and Other (See Comments)    THROAT SWELLS NECK TURNS RED  . Hydralazine Other (See Comments)    CHEST TIGHTNESS Patient has tolerated multiple doses of hydralazine since allergy was listed   . Norvasc [Amlodipine Besylate] Swelling    Leg edema   . Milk-Related Compounds Diarrhea    FAMILY HISTORY:  family history includes Diabetes in her brother, brother, father, and mother; Heart attack in her father; Heart disease in her brother, brother, father, and mother; Hypertension in her father and mother; Kidney disease in her father and mother; Lupus in her sister and sister. SOCIAL HISTORY:  reports that she has never smoked. She has never used smokeless tobacco. She reports previous alcohol use. She reports that she does not use drugs.  REVIEW OF SYSTEMS: Positives in BOLD  Gen: Denies fever, chills, weight change, fatigue, night sweats HEENT: Denies blurred vision, double vision, hearing loss, tinnitus, sinus congestion, rhinorrhea, sore throat, neck stiffness, dysphagia PULM: Denies shortness of breath, cough, sputum production, hemoptysis, wheezing CV: Denies chest pain, edema, orthopnea, paroxysmal nocturnal dyspnea, palpitations GI: Denies abdominal pain, nausea, vomiting, diarrhea, hematochezia, melena, constipation, change in bowel habits GU: sacral  spine pain, dysuria, hematuria, polyuria, oliguria, urethral discharge Endocrine: Denies hot or cold intolerance, polyuria, polyphagia or appetite change Derm: Denies rash, dry skin, scaling or peeling skin change Heme: Denies easy bruising, bleeding, bleeding gums Neuro: Denies headache, numbness, weakness, slurred speech, loss of memory or consciousness  SUBJECTIVE:  Pt c/o of mild buttocks pain  VITAL SIGNS: Temp:  [101.2 F (38.4 C)] 101.2 F (38.4 C) (05/09 2014) Pulse Rate:  [79] 79 (05/09 1947) Resp:  [34] 34 (05/09 1947) BP: (101)/(44) 101/44 (05/09 1947) Weight:  [75 kg] 75 kg (05/09 1944)  PHYSICAL EXAMINATION: General: chronically ill appearing female, NAD resting in bed  Neuro: alert, now able to answer questions, following commands  HEENT: supple, no JVD  Cardiovascular: nsr, rrr, no R/G, right internal jugular permcath  Lungs: faint crackles throughout, even, non labored  Abdomen: +BS x4, soft, obese, non distended, non tender  Musculoskeletal: normal tone, 2+ bilateral lower extremity pitting edema  Skin: stage IV sacral spine decubitus ulcer, lumbar surgical spine wound pink/no drainage present     No results for input(s): NA, K, CL, CO2, BUN, CREATININE, GLUCOSE in the last 168 hours. Recent Labs  Lab 12/11/19 1945  HGB 8.3*  HCT 29.4*  WBC PENDING  PLT 141*   DG Chest Port 1 View  Result Date: 12/11/2019 CLINICAL DATA:  79 year old female with sepsis. EXAM: PORTABLE CHEST 1 VIEW COMPARISON:  Chest radiograph dated 11/19/2019. FINDINGS: Dialysis catheter in similar position. There is stable cardiomegaly. Probable mild vascular congestion. No edema. No focal consolidation, pleural effusion, pneumothorax. Atherosclerotic calcification of the aorta.  No acute osseous pathology. Degenerative changes of the shoulders. IMPRESSION: No focal consolidation.  Probable mild vascular congestion. Electronically Signed   By: Anner Crete M.D.   On: 12/11/2019 20:33     ASSESSMENT / PLAN:  Septic shock secondary to UTI and decubitus ulcers  Continuous telemetry monitoring  Levophed gtt to maintain map >60  Hold outpatient antihypertensives for now  Continue outpatient atorvastatin  ESRD on Hemodialysis  Lactic acidosis  Hypokalemia  Trend BMP and lactic acid  Replace electrolytes as indicated  Avoid nephrotoxic medications  Nephrology consulted appreciate input-hemodialysis per recommendations   Leukocytosis secondary to UTI and sacral decubitus ulcer CT Abd/Pelvis suggesting possible osteomyelitis and L4 postoperative collection/abscess  Trend WBC and monitor fever curve  Trend PCT  Follow cultures to include wound cultures  Continue vancomycin and cefepime  Will consult General Surgery, Wound Care Team, and Infectious Disease  Turn q2hrs   Anemia without obvious acute blood loss  Thrombocytopenia  VTE px: subq heparin  Trend CBC  Monitor for s/sx of bleeding and transfuse for hgb <7  Acute toxic metabolic encephalopathy secondary to sepsis-improving  Acute pain  Frequent reorientation  Prn tylenol for pain   Discussed case as well as CT Abd Pelvis findings with ICU Intensivist Dr. Galen Daft via telephone she recommended continuing broad spectrum abx, consulting General Surgery, and Infectious Disease.  Also, recommended consulting Palliative Care Team to discuss code status and goals of care with pts family.  Marda Stalker, [place] Pager [phone] (please enter 7 digits) PCCM Consult Pager [phone] (please enter 7 digits)

## 2019-12-11 NOTE — ED Notes (Signed)
Pt now responding appropriately to this RN questions. Pt will open eyes in response to name being called. Pt able to tell this RN pt's name/birthday, as well as location. Pt denying pain at this time.

## 2019-12-11 NOTE — ED Notes (Signed)
Lab called for repeat lactic acid.

## 2019-12-11 NOTE — ED Notes (Signed)
Date and time results received: 12/11/19 2325 (use smartphrase ".now" to insert current time)  Test: Phosphorus Critical Value: <1  Name of Provider Notified: Flora Lipps  Orders Received? Or Actions Taken?: Provider notified

## 2019-12-11 NOTE — ED Triage Notes (Signed)
Pt presents via [facility] from [facility] with c/o unresponsiveness. Pt only responsive to painful stimuli. Facility reports that pt was running fever today of 101. Pt oxygen saturation room air 77%. Pt on non-rebreather upon arrival. Pt has bedsore on sacrum and thoracic region of pt's back. Pt is dialysis patient.

## 2019-12-11 NOTE — Progress Notes (Signed)
CODE SEPSIS - PHARMACY COMMUNICATION  **Broad Spectrum Antibiotics should be administered within 1 hour of Sepsis diagnosis**  Time Code Sepsis Called/Page Received:   5/9 @ 2000  Antibiotics Ordered: Vancomycin and Cefepime   Time of 1st antibiotic administration: Cefepime 2 gm IV @ 2012   Additional action taken by pharmacy:   If necessary, Name of Provider/Nurse Contacted:     Erikah Thumm D ,PharmD Clinical Pharmacist  12/11/2019  8:39 PM

## 2019-12-11 NOTE — Progress Notes (Signed)
PHARMACY -  BRIEF ANTIBIOTIC NOTE   Pharmacy has received consult(s) for Vanc, Cefepime from an ED provider.  The patient's profile has been reviewed for ht/wt/allergies/indication/available labs.    One time order(s) placed for Vancomycin 1500 mg X 1 and Cefepime 2 gm IV X 1   Further antibiotics/pharmacy consults should be ordered by admitting physician if indicated.                       Thank you, Trixie Maclaren D 12/11/2019  8:38 PM

## 2019-12-12 ENCOUNTER — Inpatient Hospital Stay: Payer: Medicare Other

## 2019-12-12 DIAGNOSIS — R6521 Severe sepsis with septic shock: Secondary | ICD-10-CM | POA: Diagnosis present

## 2019-12-12 DIAGNOSIS — L89154 Pressure ulcer of sacral region, stage 4: Secondary | ICD-10-CM | POA: Diagnosis present

## 2019-12-12 DIAGNOSIS — G9340 Encephalopathy, unspecified: Secondary | ICD-10-CM

## 2019-12-12 DIAGNOSIS — A419 Sepsis, unspecified organism: Secondary | ICD-10-CM | POA: Diagnosis present

## 2019-12-12 DIAGNOSIS — M869 Osteomyelitis, unspecified: Secondary | ICD-10-CM

## 2019-12-12 LAB — BASIC METABOLIC PANEL
Anion gap: 6 (ref 5–15)
BUN: 20 mg/dL (ref 8–23)
CO2: 23 mmol/L (ref 22–32)
Calcium: 7 mg/dL — ABNORMAL LOW (ref 8.9–10.3)
Chloride: 106 mmol/L (ref 98–111)
Creatinine, Ser: 2.14 mg/dL — ABNORMAL HIGH (ref 0.44–1.00)
GFR calc Af Amer: 25 mL/min — ABNORMAL LOW (ref >60)
GFR calc non Af Amer: 22 mL/min — ABNORMAL LOW (ref >60)
Glucose, Bld: 180 mg/dL — ABNORMAL HIGH (ref 70–99)
Potassium: 2.7 mmol/L — CL (ref 3.5–5.1)
Sodium: 135 mmol/L (ref 135–145)

## 2019-12-12 LAB — URINE CULTURE

## 2019-12-12 LAB — BLOOD CULTURE ID PANEL (REFLEXED)

## 2019-12-12 LAB — GLUCOSE, CAPILLARY
Glucose-Capillary: 130 mg/dL — ABNORMAL HIGH (ref 70–99)
Glucose-Capillary: 135 mg/dL — ABNORMAL HIGH (ref 70–99)
Glucose-Capillary: 94 mg/dL (ref 70–99)
Glucose-Capillary: 94 mg/dL (ref 70–99)
Glucose-Capillary: 98 mg/dL (ref 70–99)

## 2019-12-12 LAB — CBC WITH DIFFERENTIAL/PLATELET
Abs Immature Granulocytes: 0.33 10*3/uL — ABNORMAL HIGH (ref 0.00–0.07)
Basophils Absolute: 0.1 10*3/uL (ref 0.0–0.1)
Basophils Relative: 1 %
Eosinophils Absolute: 0 10*3/uL (ref 0.0–0.5)
Eosinophils Relative: 0 %
HCT: 26.7 % — ABNORMAL LOW (ref 36.0–46.0)
Hemoglobin: 7.9 g/dL — ABNORMAL LOW (ref 12.0–15.0)
Immature Granulocytes: 2 %
Lymphocytes Relative: 6 %
Lymphs Abs: 0.9 10*3/uL (ref 0.7–4.0)
MCH: 27.2 pg (ref 26.0–34.0)
MCHC: 29.6 g/dL — ABNORMAL LOW (ref 30.0–36.0)
MCV: 92.1 fL (ref 80.0–100.0)
Monocytes Absolute: 0.5 10*3/uL (ref 0.1–1.0)
Monocytes Relative: 3 %
Neutro Abs: 13.9 10*3/uL — ABNORMAL HIGH (ref 1.7–7.7)
Neutrophils Relative %: 88 %
Platelets: 134 10*3/uL — ABNORMAL LOW (ref 150–400)
RBC: 2.9 MIL/uL — ABNORMAL LOW (ref 3.87–5.11)
RDW: 21.2 % — ABNORMAL HIGH (ref 11.5–15.5)
WBC: 15.8 10*3/uL — ABNORMAL HIGH (ref 4.0–10.5)
nRBC: 1 % — ABNORMAL HIGH (ref 0.0–0.2)

## 2019-12-12 LAB — PATHOLOGIST SMEAR REVIEW

## 2019-12-12 LAB — MAGNESIUM: Magnesium: 1.4 mg/dL — ABNORMAL LOW (ref 1.7–2.4)

## 2019-12-12 LAB — LACTIC ACID, PLASMA: Lactic Acid, Venous: 1.4 mmol/L (ref 0.5–1.9)

## 2019-12-12 LAB — PHOSPHORUS
Phosphorus: 1.7 mg/dL — ABNORMAL LOW (ref 2.5–4.6)
Phosphorus: 2.2 mg/dL — ABNORMAL LOW (ref 2.5–4.6)

## 2019-12-12 LAB — MRSA PCR SCREENING: MRSA by PCR: NEGATIVE

## 2019-12-12 LAB — POTASSIUM: Potassium: 3.5 mmol/L (ref 3.5–5.1)

## 2019-12-12 LAB — PROCALCITONIN: Procalcitonin: 3.45 ng/mL

## 2019-12-12 MED ORDER — ORAL CARE MOUTH RINSE
15.0000 mL | Freq: Two times a day (BID) | OROMUCOSAL | Status: DC
  Administered 2019-12-12 – 2019-12-30 (×29): 15 mL via OROMUCOSAL

## 2019-12-12 MED ORDER — MORPHINE SULFATE (PF) 2 MG/ML IV SOLN
INTRAVENOUS | Status: AC
  Filled 2019-12-12: qty 1

## 2019-12-12 MED ORDER — IPRATROPIUM-ALBUTEROL 0.5-2.5 (3) MG/3ML IN SOLN
3.0000 mL | RESPIRATORY_TRACT | Status: DC
  Administered 2019-12-12 – 2019-12-14 (×15): 3 mL via RESPIRATORY_TRACT
  Filled 2019-12-12 (×15): qty 3

## 2019-12-12 MED ORDER — MAGNESIUM SULFATE 4 GM/100ML IV SOLN
4.0000 g | Freq: Once | INTRAVENOUS | Status: AC
  Administered 2019-12-12: 4 g via INTRAVENOUS
  Filled 2019-12-12: qty 100

## 2019-12-12 MED ORDER — SODIUM CHLORIDE 0.9 % IV SOLN
1.0000 g | INTRAVENOUS | Status: DC
  Administered 2019-12-12 – 2019-12-19 (×8): 1 g via INTRAVENOUS
  Filled 2019-12-12 (×9): qty 1

## 2019-12-12 MED ORDER — METRONIDAZOLE IN NACL 5-0.79 MG/ML-% IV SOLN
500.0000 mg | Freq: Three times a day (TID) | INTRAVENOUS | Status: DC
  Administered 2019-12-12 – 2019-12-20 (×22): 500 mg via INTRAVENOUS
  Filled 2019-12-12 (×30): qty 100

## 2019-12-12 MED ORDER — VANCOMYCIN VARIABLE DOSE PER UNSTABLE RENAL FUNCTION (PHARMACIST DOSING): Status: DC

## 2019-12-12 MED ORDER — NOREPINEPHRINE 4 MG/250ML-% IV SOLN
0.0000 ug/min | INTRAVENOUS | Status: DC
  Administered 2019-12-12: 6 ug/min via INTRAVENOUS
  Administered 2019-12-12: 10 ug/min via INTRAVENOUS
  Filled 2019-12-12 (×3): qty 250

## 2019-12-12 MED ORDER — CHLORHEXIDINE GLUCONATE 0.12 % MT SOLN
15.0000 mL | Freq: Two times a day (BID) | OROMUCOSAL | Status: DC
  Administered 2019-12-12 – 2019-12-30 (×30): 15 mL via OROMUCOSAL
  Filled 2019-12-12 (×29): qty 15

## 2019-12-12 MED ORDER — BUDESONIDE 0.25 MG/2ML IN SUSP
0.2500 mg | Freq: Two times a day (BID) | RESPIRATORY_TRACT | Status: DC
  Administered 2019-12-13 – 2019-12-16 (×7): 0.25 mg via RESPIRATORY_TRACT
  Filled 2019-12-12 (×8): qty 2

## 2019-12-12 MED ORDER — SODIUM CHLORIDE 0.9% FLUSH: 10.0000 mL | INTRAVENOUS | Status: DC | PRN

## 2019-12-12 MED ORDER — POTASSIUM CHLORIDE 10 MEQ/50ML IV SOLN
10.0000 meq | INTRAVENOUS | Status: AC
  Administered 2019-12-12 (×2): 10 meq via INTRAVENOUS
  Filled 2019-12-12 (×2): qty 50

## 2019-12-12 MED ORDER — CHLORHEXIDINE GLUCONATE CLOTH 2 % EX PADS
6.0000 | MEDICATED_PAD | Freq: Every day | CUTANEOUS | Status: DC
  Administered 2019-12-12 – 2019-12-30 (×13): 6 via TOPICAL

## 2019-12-12 MED ORDER — VANCOMYCIN HCL 750 MG/150ML IV SOLN
750.0000 mg | INTRAVENOUS | Status: DC
  Administered 2019-12-13: 750 mg via INTRAVENOUS
  Filled 2019-12-12: qty 150

## 2019-12-12 MED ORDER — VANCOMYCIN HCL 750 MG/150ML IV SOLN: 750.0000 mg | INTRAVENOUS | Status: DC

## 2019-12-12 MED ORDER — SODIUM CHLORIDE 0.9% FLUSH
10.0000 mL | Freq: Two times a day (BID) | INTRAVENOUS | Status: DC
  Administered 2019-12-12 – 2019-12-15 (×8): 10 mL
  Administered 2019-12-15: 30 mL
  Administered 2019-12-16 – 2019-12-17 (×2): 10 mL
  Administered 2019-12-17: 40 mL
  Administered 2019-12-18 – 2019-12-30 (×21): 10 mL

## 2019-12-12 MED ORDER — MORPHINE SULFATE (PF) 0.5 MG/ML IJ SOLN: 2.0000 mg | INTRAMUSCULAR | Status: DC | PRN

## 2019-12-12 MED ORDER — MORPHINE SULFATE (PF) 2 MG/ML IV SOLN
2.0000 mg | INTRAVENOUS | Status: DC | PRN
  Administered 2019-12-12 – 2019-12-18 (×9): 2 mg via INTRAVENOUS
  Filled 2019-12-12 (×8): qty 1

## 2019-12-12 NOTE — Progress Notes (Signed)
eLink Physician-Brief Progress Note Patient Name: Taylor Tyler DOB: [birth year] MRN: [id number]   Date of Service  12/12/2019  HPI/Events of Note  21 F ESRD on HD, DM, hypertension and hypothyroidism, chronic non healing decubitus ulcer presented with decreased LOC and hypoxia. CT with 6.2 x 4.5 cm fluid collection seen posterior to the spinal canal at the level of the L4 vertebral body, postop fluid collection vs abscess. Also with findings suggestive of osteomyelitis.  eICU Interventions  On vancomycin and cefepime. Surgery to be consulted. Lactic acid improved with fluid resuscitation     Intervention Category Major Interventions: Sepsis - evaluation and management;Change in mental status - evaluation and management Evaluation Type: New Patient Evaluation  Judd Lien 12/12/2019, 12:19 AM

## 2019-12-12 NOTE — Consult Note (Signed)
[facility] SURGICAL CONSULTATION NOTE (initial) - cpt: 09326   HISTORY OF PRESENT ILLNESS (HPI):  79 y.o. female presented to [facility] ED late on the evening of 05/09 for progressive weakness and fever. She comes from [facility] and was found to have a fever to 101F. Her son provided a majority of the history in the ED and notes the patient had ben getting progressively weaker and weaker over the course of the last week. Unable to provide much more history. On arrival in the Ed, she was found to be hypoxic to 77% and start on NRB. She was found to be negative for CoVID and influenza. Lab results revealed K+ 2.5, glucose 137, creatinine 2.27, calcium 7.9, albumin 1.5, ammonia 44, troponin 56, lactic acid 6.6, hgb 8.3, platelets 141, PT 18.5, INR 1.6, and UA positive for UTI. She also was noted to have a sacral decubitus ulceration. She was given IVF resuscitation and started on cefepime, flagyl, and vancomycin. She did not require intubation, and she was admitted to the ICU.   This morning, she will respond to her name but does not contribute further to history.   Surgery is consulted by PCCM provider Marda Stalker, NP in this context for evaluation and management of sacral decubitus ulcer in the setting of sepsis.   PAST MEDICAL HISTORY (PMH):  Past Medical History:  Diagnosis Date  . Acute blood loss anemia 03/14/2017  . Acute renal failure superimposed on stage 3 chronic kidney disease ([place]) 03/14/2017  . AKI (acute kidney injury) ([place]) 02/28/2017  . Altered mental status 03/29/2017  . Anemia   . Aortic atherosclerosis ([place])   . Arthritis    "joints might ache at times; not that bad" (06/10/2018)  . Bilateral lower extremity edema 03/04/2017  . Bradycardia   . Chronic kidney disease    ?? renal insufficiency,   . CKD (chronic kidney disease) stage 3, GFR 30-59 ml/min 01/15/2017   ?? renal insufficiency, which she thinks is coming from "all these medications"  . COVID-19    10/17/19  . Dementia ([place])   . Diabetes mellitus without complication ([place])    diagnosed 4-5 yrs ago, 06/21/18- "that was years ago"  . Disease of pancreas   . Diverticulitis    s/p perforation and partial colectomy 01/27/14 with 3 benign lymph nodes   . Diverticulosis 01/15/2017  . DJD (degenerative joint disease)   . DM (diabetes mellitus), type 2 with renal complications ([place]) 02/13/4579  . Elevated ferritin level   . Fatty liver   . Hypertension   . Hypothyroidism    "had radiation" (06/10/2018)  . Kidney disease, chronic, stage V (GFR under 15 ml/min) ([place])    per notes from nursing home-she is on dialysis  . Kidney stone   . Lethargy 02/28/2017  . Obesity, Class III, BMI 40-49.9 (morbid obesity) ([place]) 02/27/2017  . Pleural lipoma   . Postoperative wound infection 04/02/2017  . Spinal stenosis of lumbar region 01/15/2017  . Status post lumbar surgery   . Wound healing, delayed    back     PAST SURGICAL HISTORY ([place]):  Past Surgical History:  Procedure Laterality Date  . ABDOMINAL EXPOSURE N/A 02/24/2017   Procedure: ABDOMINAL EXPOSURE;  Surgeon: Angelia Mould, MD;  Location: [place];  Service: Vascular;  Laterality: N/A;  . ABDOMINAL HYSTERECTOMY  1987   no h/o abnormal paps   . ANTERIOR LAT LUMBAR FUSION N/A 02/24/2017   Procedure: Lumbar three- five Anterior lateral lumbar interbody  fusion;  Surgeon: Ditty, Kevan Ny, MD;  Location: [place];  Service: Neurosurgery;  Laterality: N/A;  L3-5 Anterior lateral lumbar interbody fusion with removal of coflex at L3-4, L4-5  . ANTERIOR LUMBAR FUSION N/A 02/24/2017   Procedure: Stage 1: Lumbar five-Sacral one Anterior lumbar interbody fusion;  Surgeon: Ditty, Kevan Ny, MD;  Location: [place];  Service: Neurosurgery;  Laterality: N/A;  Stage 1: L5-S1 Anterior lumbar interbody fusion  . APPENDECTOMY    . APPLICATION OF ROBOTIC ASSISTANCE FOR SPINAL PROCEDURE N/A 02/26/2017   Procedure: APPLICATION OF ROBOTIC ASSISTANCE FOR SPINAL  PROCEDURE;  Surgeon: Ditty, Kevan Ny, MD;  Location: [place];  Service: Neurosurgery;  Laterality: N/A;  . APPLICATION OF WOUND VAC N/A 03/30/2017   Procedure: APPLICATION OF WOUND VAC;  Surgeon: Ditty, Kevan Ny, MD;  Location: [place];  Service: Neurosurgery;  Laterality: N/A;  . APPLICATION OF WOUND VAC N/A 06/23/2019   Procedure: APPLICATION OF WOUND VAC;  Surgeon: Consuella Lose, MD;  Location: [place];  Service: Neurosurgery;  Laterality: N/A;  . AV FISTULA PLACEMENT Left 06/08/2019   Procedure: Arteriovenous (Av) Fistula Creation;  Surgeon: Rosetta Posner, MD;  Location: [place];  Service: Vascular;  Laterality: Left;  . BACK SURGERY  2013  . CATARACT EXTRACTION W/ INTRAOCULAR LENS IMPLANT Right   . CHOLECYSTECTOMY OPEN  1982  . COLON SURGERY  01/26/2014   desc.sigmoid colectomy and ventral hernia repair and splenic flexure mobilization  . COLON SURGERY    . DILATION AND CURETTAGE OF UTERUS    . HERNIA REPAIR    . INSERTION OF DIALYSIS CATHETER Right 06/08/2019   Procedure: INSERTION OF DIALYSIS CATHETER RIGHT INTERNAL JUGULAR;  Surgeon: Rosetta Posner, MD;  Location: [place];  Service: Vascular;  Laterality: Right;  . LUMBAR LAMINECTOMY WITH SPINOUS PROCESS PLATE 2 LEVEL N/A 12/07/3974   Procedure: LUMBAR LAMINECTOMY/DECOMPRESSION MICRODISCECTOMY CoFlex;  Surgeon: Faythe Ghee, MD;  Location: [facility];  Service: Neurosurgery;  Laterality: N/A;  Lumbar three-four,Lumbar Four-Five Laminectomy with Coflex  . LUMBAR WOUND DEBRIDEMENT N/A 03/30/2017   Procedure: Lumbar wound exploration/debridement, placement of wound vac;  Surgeon: Ditty, Kevan Ny, MD;  Location: [place];  Service: Neurosurgery;  Laterality: N/A;  Lumbar wound exploration/debridement, placement of wound vac  . LUMBAR WOUND DEBRIDEMENT N/A 06/06/2018   Procedure: LUMBAR WOUND DEBRIDEMENT/EXPLORATION;  Surgeon: Consuella Lose, MD;  Location: [place];  Service: Neurosurgery;  Laterality: N/A;  . LUMBAR WOUND  DEBRIDEMENT N/A 06/22/2018   Procedure: SIMPLE INCISION AND DRAINAGE OF WOUND, APPLICATION OF WOUND VAC;  Surgeon: Consuella Lose, MD;  Location: [place];  Service: Neurosurgery;  Laterality: N/A;  . LUMBAR WOUND DEBRIDEMENT N/A 06/23/2019   Procedure: LUMBAR WOUND DEBRIDEMENT WITH HARDWARE REMOVAL;  Surgeon: Consuella Lose, MD;  Location: [place];  Service: Neurosurgery;  Laterality: N/A;  . PARTIAL COLECTOMY     01/27/14 diverticulitis and 3 benign lymph nodes [facility] Dr. Pat Patrick   . REDUCTION MAMMAPLASTY Bilateral 1992  . THROMBECTOMY BRACHIAL ARTERY Left 06/08/2019   Procedure: Thrombectomy Brachial Artery;  Surgeon: Rosetta Posner, MD;  Location: [place];  Service: Vascular;  Laterality: Left;  . TUBAL LIGATION    . VENTRAL HERNIA REPAIR  2014     MEDICATIONS:  Prior to Admission medications   Medication Sig Start Date End Date Taking? Authorizing Provider  acetaminophen (TYLENOL) 500 MG tablet Take 500 mg by mouth daily as needed for mild pain.    Yes [provider]  atorvastatin (LIPITOR) 40 MG tablet  Take 1 tablet (40 mg total) by mouth daily at 6 PM. 09/03/18  Yes McLean-Scocuzza, Nino Glow, MD  benzonatate (TESSALON PERLES) 100 MG capsule Take 1 capsule (100 mg total) by mouth 3 (three) times daily as needed for cough. 08/07/19 08/06/20 Yes Earleen Newport, MD  citalopram (CELEXA) 10 MG tablet Take 10 mg by mouth daily.   Yes [provider]  collagenase (SANTYL) ointment Apply topically daily. 11/24/19  Yes Nicole Kindred A, DO  divalproex (DEPAKOTE SPRINKLE) 125 MG capsule Take 125 mg by mouth 2 (two) times daily.    Yes [provider]  docusate sodium (COLACE) 100 MG capsule Take 100 mg by mouth 2 (two) times daily.   Yes [provider]  donepezil (ARICEPT) 5 MG tablet Take 5 mg by mouth at bedtime.   Yes [provider]  epoetin alfa (EPOGEN) 4000 UNIT/ML injection Inject 1 mL (4,000 Units total) into the skin Every Tuesday,Thursday,and  Saturday with dialysis. 11/09/19  Yes Enzo Bi, MD  gabapentin (NEURONTIN) 100 MG capsule Take 2 capsules (200 mg total) by mouth at bedtime. 07/04/19  Yes Regalado, Belkys A, MD  labetalol (NORMODYNE) 100 MG tablet Take 1 tablet (100 mg total) by mouth 2 (two) times daily. 07/08/19  Yes Irene Pap N, DO  Lactobacillus (ACIDOPHILUS PO) Take 175 mg by mouth 2 (two) times daily.   Yes [provider]  lactulose, encephalopathy, (GENERLAC) 10 GM/15ML SOLN Take 10 g by mouth daily.   Yes [provider]  levothyroxine (SYNTHROID, LEVOTHROID) 100 MCG tablet Take 100 mcg by mouth daily before breakfast.   Yes [provider]  meclizine (ANTIVERT) 12.5 MG tablet Take 1 tablet (12.5 mg total) by mouth daily as needed for dizziness. 11/09/19  Yes Enzo Bi, MD  memantine (NAMENDA) 5 MG tablet Take 5 mg by mouth 2 (two) times daily.   Yes [provider]  Nutritional Supplements (FEEDING SUPPLEMENT, NEPRO CARB STEADY,) LIQD Take 237 mLs by mouth daily.   Yes [provider]  risperiDONE (RISPERDAL) 0.25 MG tablet Take 0.25 mg by mouth 2 (two) times daily.   Yes [provider]  sevelamer carbonate (RENVELA) 800 MG tablet Take 1 tablet (800 mg total) by mouth 3 (three) times daily with meals. 07/04/19  Yes Regalado, Belkys A, MD  traMADol (ULTRAM) 50 MG tablet Take 25 mg by mouth every 6 (six) hours as needed for moderate pain.    Yes [provider]  zinc oxide 20 % ointment Apply 1 application topically in the morning, at noon, and at bedtime.   Yes [provider]  folic acid (FOLVITE) 973 MCG tablet Take 400 mcg by mouth daily.     [provider]     ALLERGIES:  Allergies  Allergen Reactions  . Clams [Shellfish Allergy] Swelling and Other (See Comments)    THROAT SWELLS NECK TURNS RED  . Hydralazine Other (See Comments)    CHEST TIGHTNESS Patient has tolerated multiple doses of hydralazine since allergy was listed   .  Norvasc [Amlodipine Besylate] Swelling    Leg edema   . Milk-Related Compounds Diarrhea     SOCIAL HISTORY:  Social History   Socioeconomic History  . Marital status: Widowed    Spouse name: Not on file  . Number of children: Not on file  . Years of education: Not on file  . Highest education level: Not on file  Occupational History  . Occupation: retired Economist work  Tobacco Use  .  Smoking status: Never Smoker  . Smokeless tobacco: Never Used  Substance and Sexual Activity  . Alcohol use: Not Currently    Comment: "nothing since age 47" (06/10/2018)  . Drug use: Never  . Sexual activity: Not Currently  Other Topics Concern  . Not on file  Social History Narrative   Admitted to [facility] 03/03/17   Widowed since 2012    2 sons [name] comes to appts)    Never smoked   Alcohol none   Full Code   12th grade education retired    Occupational hygienist    Social Determinants of Radio broadcast assistant Strain:   . Difficulty of Paying Living Expenses:   Food Insecurity:   . Worried About Charity fundraiser in the Last Year:   . Arboriculturist in the Last Year:   Transportation Needs:   . Film/video editor (Medical):   Marland Kitchen Lack of Transportation (Non-Medical):   Physical Activity:   . Days of Exercise per Week:   . Minutes of Exercise per Session:   Stress:   . Feeling of Stress :   Social Connections:   . Frequency of Communication with Friends and Family:   . Frequency of Social Gatherings with Friends and Family:   . Attends Religious Services:   . Active Member of Clubs or Organizations:   . Attends Archivist Meetings:   Marland Kitchen Marital Status:   Intimate Partner Violence:   . Fear of Current or Ex-Partner:   . Emotionally Abused:   Marland Kitchen Physically Abused:   . Sexually Abused:      FAMILY HISTORY:  Family History  Problem Relation Age of Onset  . Diabetes Father   . Heart attack Father   . Hypertension Father   . Heart disease Father     . Kidney disease Father   . Diabetes Mother   . Hypertension Mother   . Heart disease Mother   . Kidney disease Mother   . Lupus Sister   . Heart disease Brother   . Diabetes Brother   . Heart disease Brother   . Diabetes Brother   . Lupus Sister       REVIEW OF SYSTEMS:  Review of Systems  Unable to perform ROS: Mental acuity    VITAL SIGNS:  Temp:  [95 F (35 C)-101.2 F (38.4 C)] 97.9 F (36.6 C) (05/10 0300) Pulse Rate:  [66-114] 75 (05/10 0230) Resp:  [11-34] 15 (05/10 0700) BP: (84-178)/(33-63) 133/51 (05/10 0700) SpO2:  [85 %-100 %] 100 % (05/10 0300) Weight:  [75 kg-75.8 kg] 75.8 kg (05/10 0025)     Height: 5\' 6"  (167.6 cm) Weight: 75.8 kg BMI (Calculated): 26.98   INTAKE/OUTPUT:  05/09 0701 - 05/10 0700 In: 758.6 [I.V.:336.8; IV Piggyback:421.8] Out: 100 [Urine:100]  PHYSICAL EXAM:  Physical Exam Constitutional:      General: She is not in acute distress.    Appearance: She is obese. She is not ill-appearing.     Comments: Pt is somnolent, arouses to name only and does not respond further to any additional questioning  HENT:     Head: Normocephalic and atraumatic.  Cardiovascular:     Rate and Rhythm: Normal rate and regular rhythm.     Pulses: Normal pulses.  Pulmonary:     Effort: Pulmonary effort is normal. No respiratory distress.     Comments: On [place] Genitourinary:    Comments: Deferred, foley in place Musculoskeletal:  Right lower leg: No edema.     Left lower leg: No edema.  Skin:    General: Skin is warm and dry.       Neurological:     Comments: Somnolent, arouses to name only  Psychiatric:     Comments: Unable to assess    Sacral Wound (12/12/2019):     Midline Lumbar Back Wound (12/12/2019):      Labs:  CBC Latest Ref Rng & Units 12/12/2019 12/11/2019 11/21/2019  WBC 4.0 - 10.5 K/uL 15.8(H) 11.5(H) 10.0  Hemoglobin 12.0 - 15.0 g/dL 7.9(L) 8.3(L) 8.2(L)  Hematocrit 36.0 - 46.0 % 26.7(L) 29.4(L) 28.3(L)  Platelets 150 -  400 K/uL 134(L) 141(L) 123(L)   CMP Latest Ref Rng & Units 12/12/2019 12/11/2019 11/22/2019  Glucose 70 - 99 mg/dL 180(H) 137(H) 79  BUN 8 - 23 mg/dL 20 20 12   Creatinine 0.44 - 1.00 mg/dL 2.14(H) 2.27(H) 1.58(H)  Sodium 135 - 145 mmol/L 135 138 132(L)  Potassium 3.5 - 5.1 mmol/L 2.7(LL) 2.5(LL) 3.9  Chloride 98 - 111 mmol/L 106 102 98  CO2 22 - 32 mmol/L 23 26 26   Calcium 8.9 - 10.3 mg/dL 7.0(L) 7.9(L) 7.5(L)  Total Protein 6.5 - 8.1 g/dL - 5.1(L) -  Total Bilirubin 0.3 - 1.2 mg/dL - 0.4 -  Alkaline Phos 38 - 126 U/L - 90 -  AST 15 - 41 U/L - 30 -  ALT 0 - 44 U/L - 23 -     Imaging studies:   CT Abdomen/Pelvis (12/11/2019) personally reviewed showing large ventral hernia and sacral decubitus ulceration, and radiologist report reviewed below:  IMPRESSION: 1. Extensive postoperative changes within the lumbar spine, as described above, with a 6.2 cm x 4.5 cm area of air and fluid attenuation seen posterior to the spinal canal at the level of the L4 vertebral body. This may represent a postoperative collection/abscess. 2. Large, ventral hernias, as described above. 3. Sacral decubitus ulcer with additional findings suggestive of acute osteomyelitis. 4. Stable 6 mm nonobstructing renal stone within the left kidney. 5. Sigmoid diverticulosis.    Assessment/Plan: (ICD-10's: M86.9) 79 y.o. female with admitted with sepsis of uncertain etiology thought to be either secondary to UTI vs decubitus ulceration, complicated by pertinent comorbidities including very deconditioned and debilitated state.   - I did preform bedside debridement of the bridge of necrotic appearing tissue overlaying the sacral wound. She tolerated this well. The wound was otherwise healthy but she does appear to likely have chronic osteomyelitis with very friable coccyx. We could consider bedside bone biopsy if needed.   - No need for surgical debridement at this time  - recommend local wound care +/- WOC RN consult,  frequent repositioning, low air loss mattress  - continue IV Abx; narrow as appropriate    - further management per primary service; we will follow for now   All of the above findings and recommendations were discussed with the medical team  Thank you for the opportunity to participate in this patient's care.   -- Edison Simon, PA-C [facility] 12/12/2019, 7:37 AM [phone] M-F: 7am - 4pm

## 2019-12-12 NOTE — Progress Notes (Signed)
The Clinical status was relayed to family in detail. [name] the patients son was updated.  Updated and notified of patients medical condition.  Patient with Progressive multiorgan failure with very low chance of meaningful recovery despite all aggressive and optimal medical therapy.  Patient is associated with suffering.  Family understands the situation.  Family are satisfied with Plan of action and management. All questions answered  Based on My conservation with [facility] ascorbates/surgeon, patient has chronic wound with nonhealing ulcer with osteomyelitis with multiple attempts of wash outs and wound vacs with NON-complaince of the patient.  At this time, there is NO further NEUROSURGICAL INTERVENTION AT THIS TIME THAT WOULD FIX HER LUMBER/SACRAL SPINE.  PROGNOSIS IS VERY POOR PALLIATIVE CARE TEAM TO MEET WITH FAMILY TOMORROW  Additional CC time 21 mins   Braxton Weisbecker Patricia Pesa, M.D.  [facility]  Medical Director [place] Director [facility] Cardio-Pulmonary Department

## 2019-12-12 NOTE — Progress Notes (Signed)
Pt alert only to voice throughout shift. Pt has remained Disoriented x4. NSR, O2-100% on 3L,. No urine output. Son was contacted by palliative and is suppose to have a meeting over the phone tomorrow at 11am.

## 2019-12-12 NOTE — Progress Notes (Signed)
Fluid resuscitation effective. LA down from 6.6 to 1.4 and patient is continuously being monitored in ICU.

## 2019-12-12 NOTE — Procedures (Signed)
Central Venous Catheter Insertion Procedure Note ARNESIA VINCELETTE [id number] Jun 20, 1941  Procedure: Insertion of Central Venous Catheter Indications: Assessment of intravascular volume, Drug and/or fluid administration and Frequent blood sampling  Procedure Details Consent: Risks of procedure as well as the alternatives and risks of each were explained to the (patient/caregiver).  Consent for procedure obtained. Time Out: Verified patient identification, verified procedure, site/side was marked, verified correct patient position, special equipment/implants available, medications/allergies/relevent history reviewed, required imaging and test results available.  Performed  Maximum sterile technique was used including antiseptics, cap, gloves, gown, hand hygiene, mask and sheet. Skin prep: Chlorhexidine; local anesthetic administered A antimicrobial bonded/coated triple lumen catheter was placed in the left internal jugular vein using the Seldinger technique.  Evaluation Blood flow good Complications: No apparent complications Patient did tolerate procedure well. Chest X-ray ordered to verify placement.  CXR: pending.  Left internal jugular central line placed utilizing ultrasound no complications noted during or following procedure  Marda Stalker, [place] Pager [phone] (please enter 7 digits) [place] Pager [phone] (please enter 7 digits)

## 2019-12-12 NOTE — Progress Notes (Signed)
PHARMACY - PHYSICIAN COMMUNICATION CRITICAL VALUE ALERT - BLOOD CULTURE IDENTIFICATION (BCID)  Taylor Tyler is an 79 y.o. female who presented to [facility] on 12/11/2019. Patient with sacral wounds and concern for osteomyelitis. Possible spinal abscess. Patient with h/o Proteus in spinal abscess cultures as well as multiple organisms in sacral wounds.  Assessment:  3/4 bottles Proteus  Name of physician (or Provider) Contacted: Dr. Mortimer Fries  Current antibiotics: vancomycin, cefepime  Changes to prescribed antibiotics recommended: adding metronidazole for anaerobic coverage. ID consulted. Will defer additional modifications to ID.  Results for orders placed or performed during the hospital encounter of 12/11/19  Blood Culture ID Panel (Reflexed) (Collected: 12/11/2019  7:45 PM)  Result Value Ref Range   Enterococcus species NOT DETECTED NOT DETECTED   Listeria monocytogenes NOT DETECTED NOT DETECTED   Staphylococcus species NOT DETECTED NOT DETECTED   Staphylococcus aureus (BCID) NOT DETECTED NOT DETECTED   Streptococcus species NOT DETECTED NOT DETECTED   Streptococcus agalactiae NOT DETECTED NOT DETECTED   Streptococcus pneumoniae NOT DETECTED NOT DETECTED   Streptococcus pyogenes NOT DETECTED NOT DETECTED   Acinetobacter baumannii NOT DETECTED NOT DETECTED   Enterobacteriaceae species DETECTED (A) NOT DETECTED   Enterobacter cloacae complex NOT DETECTED NOT DETECTED   Escherichia coli NOT DETECTED NOT DETECTED   Klebsiella oxytoca NOT DETECTED NOT DETECTED   Klebsiella pneumoniae NOT DETECTED NOT DETECTED   Proteus species DETECTED (A) NOT DETECTED   Serratia marcescens NOT DETECTED NOT DETECTED   Carbapenem resistance NOT DETECTED NOT DETECTED   Haemophilus influenzae NOT DETECTED NOT DETECTED   Neisseria meningitidis NOT DETECTED NOT DETECTED   Pseudomonas aeruginosa NOT DETECTED NOT DETECTED   Candida albicans NOT DETECTED NOT DETECTED   Candida glabrata NOT DETECTED NOT DETECTED    Candida krusei NOT DETECTED NOT DETECTED   Candida parapsilosis NOT DETECTED NOT DETECTED   Candida tropicalis NOT DETECTED NOT DETECTED    Tawnya Crook, PharmD 12/12/2019  12:28 PM

## 2019-12-12 NOTE — Plan of Care (Signed)
  Problem: Education: Goal: Knowledge of General Education information will improve Description: Including pain rating scale, medication(s)/side effects and non-pharmacologic comfort measures Outcome: Not Progressing   Problem: Health Behavior/Discharge Planning: Goal: Ability to manage health-related needs will improve Outcome: Not Progressing   Problem: Clinical Measurements: Goal: Ability to maintain clinical measurements within normal limits will improve Outcome: Not Progressing Goal: Will remain free from infection Outcome: Not Progressing Goal: Diagnostic test results will improve Outcome: Not Progressing   Problem: Activity: Goal: Risk for activity intolerance will decrease Outcome: Not Progressing   Problem: Nutrition: Goal: Adequate nutrition will be maintained Outcome: Not Progressing   Problem: Coping: Goal: Level of anxiety will decrease Outcome: Not Progressing   Problem: Elimination: Goal: Will not experience complications related to bowel motility Outcome: Not Progressing Goal: Will not experience complications related to urinary retention Outcome: Not Progressing   Problem: Pain Managment: Goal: General experience of comfort will improve Outcome: Not Progressing   Problem: Safety: Goal: Ability to remain free from injury will improve Outcome: Not Progressing   Problem: Skin Integrity: Goal: Risk for impaired skin integrity will decrease Outcome: Not Progressing

## 2019-12-12 NOTE — Progress Notes (Signed)
Pharmacy Antibiotic Note  Taylor Tyler is a 79 y.o. female admitted on 12/11/2019 with sepsis. Patient with sacral wounds and concern for osteomyelitis as well as spinal abscess. Patient with h/o Proteus in lumbar culture as well as multiple organisms in wound cultures. Now presenting with Proteus bacteremia. Pharmacy has been consulted for Vanc, Cefepime dosing. Will add metronidazole for anaerobic coverage. ID and surgery consulted, following for recommendations.  Patient with ESRD on HD TTS. Per nephrology, plan to continue with regular dialysis schedule.  Plan: Vancomycin 750 mg TTS with dialysis. Will follow Nephrology plan for any changes and adjust as needed.  Will change cefepime to 1 g IV q24h to be given in the evening.  Height: 5\' 6"  (167.6 cm) Weight: 75.8 kg (167 lb 1.7 oz) IBW/kg (Calculated) : 59.3  Temp (24hrs), Avg:98.6 F (37 C), Min:95 F (35 C), Max:101.2 F (38.4 C)  Recent Labs  Lab 12/11/19 1945 12/12/19 0206 12/12/19 0411  WBC 11.5*  --  15.8*  CREATININE 2.27*  --  2.14*  LATICACIDVEN 6.6* 1.4  --     Estimated Creatinine Clearance: 23.3 mL/min (A) (by C-G formula based on SCr of 2.14 mg/dL (H)).    Allergies  Allergen Reactions  . Clams [Shellfish Allergy] Swelling and Other (See Comments)    THROAT SWELLS NECK TURNS RED  . Hydralazine Other (See Comments)    CHEST TIGHTNESS Patient has tolerated multiple doses of hydralazine since allergy was listed   . Norvasc [Amlodipine Besylate] Swelling    Leg edema   . Milk-Related Compounds Diarrhea    Thank you for allowing pharmacy to be a part of this patient's care.  Dorena Bodo, PharmD Clinical Pharmacist 12/12/2019 2:25 PM

## 2019-12-12 NOTE — Progress Notes (Signed)
CRITICAL CARE NOTE 79 yo female with ESRD on HD admitted with acute toxic metabolic encephalopathy secondary to severe sepsis in the setting of UTI, chronic nonhealing sacral decubitus ulcer CT suggestive of osteomyelitis along with L4 vertebral body postoperative collection/abscess requiring levophed gtt   SIGNIFICANT EVENTS/STUDIES:  05/9: Pt presented to [facility] ER minimally responsive and hypoxic.   05/9: Pt admitted to ICU with septic shock requiring levophed gtt with improvement in mentation and hypoxia resolved 05/9: CT Head revealed no acute intracranial abnormality. Mild to moderate severity cerebral atrophy and microvascular disease changes of the supratentorial brain. Mild sphenoid sinus disease. 05/9: CT Abd/Pelvis revealed extensive postoperative changes within the lumbar spine, as described above, with a 6.2 cm x 4.5 cm area of air and fluid attenuation seen posterior to the spinal canal at the level of the L4 vertebral body. This may represent a postoperative collection/abscess. Large, ventral hernias, as described above. Sacral decubitus ulcer with additional findings suggestive of acute osteomyelitis. Stable 6 mm nonobstructing renal stone within the left kidney. Sigmoid diverticulosis.   CC  follow up septic shock  SUBJECTIVE Patient remains critically ill Prognosis is guarded   BP (!) 133/51   Pulse 75   Temp 97.9 F (36.6 C) (Axillary)   Resp 15   Ht 5\' 6"  (1.676 m)   Wt 75.8 kg   SpO2 100%   BMI 26.97 kg/m    I/O last 3 completed shifts: In: 758.6 [I.V.:336.8; IV Piggyback:421.8] Out: 100 [Urine:100] No intake/output data recorded.  SpO2: 100 % O2 Flow Rate (L/min): 2 L/min  Estimated body mass index is 26.97 kg/m as calculated from the following:   Height as of this encounter: 5\' 6"  (1.676 m).   Weight as of this encounter: 75.8 kg.  SIGNIFICANT EVENTS   REVIEW OF SYSTEMS  PATIENT IS UNABLE TO PROVIDE COMPLETE REVIEW OF SYSTEMS DUE TO SEVERE  CRITICAL ILLNESS   Pressure Injury 11/19/19 Sacrum Mid;Right Unstageable - Full thickness tissue loss in which the base of the injury is covered by slough (yellow, tan, gray, green or brown) and/or eschar (tan, brown or black) in the wound bed. deep wound with bed covered  (Active)  11/19/19 1800  Location: Sacrum  Location Orientation: Mid;Right  Staging: Unstageable - Full thickness tissue loss in which the base of the injury is covered by slough (yellow, tan, gray, green or brown) and/or eschar (tan, brown or black) in the wound bed.  Wound Description (Comments): deep wound with bed covered by slough  Present on Admission: Yes     Pressure Injury 11/19/19 Heel Left Deep Tissue Pressure Injury - Purple or maroon localized area of discolored intact skin or blood-filled blister due to damage of underlying soft tissue from pressure and/or shear. (Active)  11/19/19 1800  Location: Heel  Location Orientation: Left  Staging: Deep Tissue Pressure Injury - Purple or maroon localized area of discolored intact skin or blood-filled blister due to damage of underlying soft tissue from pressure and/or shear.  Wound Description (Comments):   Present on Admission: Yes      PHYSICAL EXAMINATION:  GENERAL:critically ill appearing,  HEAD: Normocephalic, atraumatic.  EYES: Pupils equal, round, reactive to light.  No scleral icterus.  MOUTH: Moist mucosal membrane. NECK: Supple.  PULMONARY: +rhonchi, +wheezing CARDIOVASCULAR: S1 and S2. Regular rate and rhythm. No murmurs, rubs, or gallops.  GASTROINTESTINAL: Soft, nontender, -distended.  Positive bowel sounds.   MUSCULOSKELETAL: stage 4 sacral decub NEUROLOGIC: lethargic SKIN:intact,warm,dry  MEDICATIONS: I have reviewed all medications and  confirmed regimen as documented   CULTURE RESULTS   Recent Results (from the past 240 hour(s))  Blood Culture (routine x 2)     Status: None (Preliminary result)   Collection Time: 12/11/19  7:45 PM    Specimen: BLOOD  Result Value Ref Range Status   Specimen Description BLOOD RIGHT FA  Final   Special Requests   Final    BOTTLES DRAWN AEROBIC AND ANAEROBIC Blood Culture adequate volume   Culture  Setup Time   Final    Organism ID to follow GRAM NEGATIVE RODS ANAEROBIC BOTTLE ONLY CRITICAL RESULT CALLED TO, READ BACK BY AND VERIFIED WITH: Performed at [facility], [address] [place], [place] [address]    Culture GRAM NEGATIVE RODS  Final   Report Status PENDING  Incomplete  Blood Culture (routine x 2)     Status: None (Preliminary result)   Collection Time: 12/11/19  7:45 PM   Specimen: BLOOD  Result Value Ref Range Status   Specimen Description BLOOD RIGHT ARM  Final   Special Requests   Final    BOTTLES DRAWN AEROBIC AND ANAEROBIC Blood Culture adequate volume   Culture  Setup Time   Final    GRAM NEGATIVE RODS ANAEROBIC BOTTLE ONLY Performed at [facility], [address] [place],  [address]    Culture GRAM NEGATIVE RODS  Final   Report Status PENDING  Incomplete  Respiratory Panel by RT PCR (Flu A&B, Covid) - Nasopharyngeal Swab     Status: None   Collection Time: 12/11/19  9:17 PM   Specimen: Nasopharyngeal Swab  Result Value Ref Range Status   SARS Coronavirus 2 by RT PCR NEGATIVE NEGATIVE Final    Comment: (NOTE) SARS-CoV-2 target nucleic acids are NOT DETECTED. The SARS-CoV-2 RNA is generally detectable in upper respiratoy specimens during the acute phase of infection. The lowest concentration of SARS-CoV-2 viral copies this assay can detect is 131 copies/mL. A negative result does not preclude SARS-Cov-2 infection and should not be used as the sole basis for treatment or other patient management decisions. A negative result may occur with  improper specimen collection/handling, submission of specimen other than nasopharyngeal swab, presence of viral mutation(s) within the areas targeted by this assay, and inadequate number of  viral copies (<131 copies/mL). A negative result must be combined with clinical observations, patient history, and epidemiological information. The expected result is Negative. Fact Sheet for Patients:  PinkCheek.be Fact Sheet for Healthcare Providers:  GravelBags.it This test is not yet ap proved or cleared by the [place] FDA and  has been authorized for detection and/or diagnosis of SARS-CoV-2 by FDA under an Emergency Use Authorization (EUA). This EUA will remain  in effect (meaning this test can be used) for the duration of the COVID-19 declaration under Section 564(b)(1) of the Act, 21 U.S.C. section 360bbb-3(b)(1), unless the authorization is terminated or revoked sooner.    Influenza A by PCR NEGATIVE NEGATIVE Final   Influenza B by PCR NEGATIVE NEGATIVE Final    Comment: (NOTE) The Xpert Xpress SARS-CoV-2/FLU/RSV assay is intended as an aid in  the diagnosis of influenza from Nasopharyngeal swab specimens and  should not be used as a sole basis for treatment. Nasal washings and  aspirates are unacceptable for Xpert Xpress SARS-CoV-2/FLU/RSV  testing. Fact Sheet for Patients: PinkCheek.be Fact Sheet for Healthcare Providers: GravelBags.it This test is not yet approved or cleared by the [place] FDA and  has been authorized for detection and/or diagnosis  of SARS-CoV-2 by  FDA under an Emergency Use Authorization (EUA). This EUA will remain  in effect (meaning this test can be used) for the duration of the  Covid-19 declaration under Section 564(b)(1) of the Act, 21  U.S.C. section 360bbb-3(b)(1), unless the authorization is  terminated or revoked. Performed at [facility], [address] [place], [place] [address]   MRSA PCR Screening     Status: None   Collection Time: 12/12/19 12:29 AM   Specimen: Nasopharyngeal  Result Value Ref  Range Status   MRSA by PCR NEGATIVE NEGATIVE Final    Comment:        The GeneXpert MRSA Assay (FDA approved for NASAL specimens only), is one component of a comprehensive MRSA colonization surveillance program. It is not intended to diagnose MRSA infection nor to guide or monitor treatment for MRSA infections. Performed at [facility], [address] [place], [place] [address]           IMAGING    CT ABDOMEN PELVIS WO CONTRAST  Result Date: 12/11/2019 CLINICAL DATA:  Unresponsive. EXAM: CT ABDOMEN AND PELVIS WITHOUT CONTRAST TECHNIQUE: Multidetector CT imaging of the abdomen and pelvis was performed following the standard protocol without IV contrast. COMPARISON:  March 29, 2017 FINDINGS: Lower chest: No acute abnormality. Hepatobiliary: No focal liver abnormality is seen. No gallstones, gallbladder wall thickening, or biliary dilatation. Pancreas: Unremarkable. No pancreatic ductal dilatation or surrounding inflammatory changes. Spleen: Normal in size without focal abnormality. Adrenals/Urinary Tract: Adrenal glands are unremarkable. Kidneys are normal in size, without focal lesions or hydronephrosis. A 6 mm nonobstructing renal stone is seen within the lower pole of the left kidney. A Foley catheter is seen within the urinary bladder. Stomach/Bowel: There is a small hiatal hernia. The appendix is not identified. Surgically anastomosed bowel is seen within the mid sigmoid colon. Noninflamed diverticula are also seen within the proximal sigmoid colon. No evidence of bowel dilatation. Vascular/Lymphatic: Moderate to marked severity aortic calcification. No enlarged abdominal or pelvic lymph nodes. Reproductive: Status post hysterectomy. No adnexal masses. Other: There is a predominant stable 11.9 cm x 3.7 cm ventral hernia seen just above the level of the umbilicus. This contains fat and a segment of nondilated transverse colon. An additional 13.4 cm x 6.2 cm ventral hernia is  seen along the anterior aspect of the lower pelvic wall. This is mildly increased in size when compared to the prior study and contains fat and multiple nondilated loops of distal small bowel. Musculoskeletal: Multilevel degenerative changes seen throughout the lumbar spine with a metallic density fusion plate and screws seen along the anterior aspect of the levels of L5 and S1. This represents a new finding when compared to the prior exam. The multiple bilateral pedicle screws seen throughout the lumbar spine on the prior study have been removed. Additional postoperative changes are seen involving the posterior elements of the lumbar spine. A 6.2 cm x 4.5 cm area of air and fluid attenuation is seen posterior to the spinal canal at the level of the L4 vertebral body. Tiny foci of air are seen within the prevertebral soft tissues at the level of L4-L5, and within the adjacent portion of the lower left hemipelvis. These extend to the region adjacent to the mid sigmoid colon. A 3.2 cm x 3.1 cm x 2.0 cm soft tissue defect is seen along the posterior aspect of the lumbar spine at the level of L3 and L4. A 3.4 cm by 0.5 cm sacral decubitus  ulcer is seen along the mid to lower sacrum, to the left of midline. A small amount of adjacent cortical destruction is seen. IMPRESSION: 1. Extensive postoperative changes within the lumbar spine, as described above, with a 6.2 cm x 4.5 cm area of air and fluid attenuation seen posterior to the spinal canal at the level of the L4 vertebral body. This may represent a postoperative collection/abscess. 2. Large, ventral hernias, as described above. 3. Sacral decubitus ulcer with additional findings suggestive of acute osteomyelitis. 4. Stable 6 mm nonobstructing renal stone within the left kidney. 5. Sigmoid diverticulosis. Aortic Atherosclerosis (ICD10-I70.0). Electronically Signed   By: Virgina Norfolk M.D.   On: 12/11/2019 22:02   CT HEAD WO CONTRAST  Result Date:  12/11/2019 CLINICAL DATA:  Unresponsive. EXAM: CT HEAD WITHOUT CONTRAST TECHNIQUE: Contiguous axial images were obtained from the base of the skull through the vertex without intravenous contrast. COMPARISON:  November 19, 2019 FINDINGS: Brain: There is mild to moderate severity cerebral atrophy with widening of the extra-axial spaces and ventricular dilatation. There are areas of decreased attenuation within the white matter tracts of the supratentorial brain, consistent with microvascular disease changes. Vascular: No hyperdense vessel or unexpected calcification. Skull: Normal. Negative for fracture or focal lesion. Sinuses/Orbits: There is mild sphenoid sinus mucosal thickening. Other: None. IMPRESSION: 1. No acute intracranial abnormality. 2. Mild to moderate severity cerebral atrophy and microvascular disease changes of the supratentorial brain. 3. Mild sphenoid sinus disease. Electronically Signed   By: Virgina Norfolk M.D.   On: 12/11/2019 21:32   DG Chest Port 1 View  Result Date: 12/12/2019 CLINICAL DATA:  Central line placement EXAM: PORTABLE CHEST 1 VIEW COMPARISON:  12/11/2019 FINDINGS: Single frontal view of the chest demonstrates left internal jugular catheter tip overlying superior vena cava. Stable right internal jugular dialysis catheter. The cardiac silhouette is enlarged. There is central vascular congestion, with interval development of patchy bilateral perihilar ground-glass airspace disease. No effusion or pneumothorax. No acute bony abnormalities. IMPRESSION: 1. No complication after left internal jugular catheter placement. 2. Worsening volume status, with development of mild pulmonary edema. Electronically Signed   By: Randa Ngo M.D.   On: 12/12/2019 02:03   DG Chest Port 1 View  Result Date: 12/11/2019 CLINICAL DATA:  79 year old female with sepsis. EXAM: PORTABLE CHEST 1 VIEW COMPARISON:  Chest radiograph dated 11/19/2019. FINDINGS: Dialysis catheter in similar position. There  is stable cardiomegaly. Probable mild vascular congestion. No edema. No focal consolidation, pleural effusion, pneumothorax. Atherosclerotic calcification of the aorta. No acute osseous pathology. Degenerative changes of the shoulders. IMPRESSION: No focal consolidation.  Probable mild vascular congestion. Electronically Signed   By: Anner Crete M.D.   On: 12/11/2019 20:33     Nutrition Status:           Indwelling Urinary Catheter continued, requirement due to   Reason to continue Indwelling Urinary Catheter strict Intake/Output monitoring for hemodynamic instability   Central Line/ continued, requirement due to  Reason to continue [place] of central venous pressure or other hemodynamic parameters and poor IV access       ASSESSMENT AND PLAN SYNOPSIS  Septic shock -use vasopressors to keep MAP>65 -follow ABG and LA -follow up cultures -emperic ABX -consider stress dose steroids    END STAGE  KIDNEY INJURY/Renal Failure -follow chem 7 -follow UO HD as needed  NEUROLOGY Acute encephalopathy from septic shock   CARDIAC ICU monitoring  ID -continue IV abx as prescibed -follow up cultures  GI GI  PROPHYLAXIS as indicated  NUTRITIONAL STATUS Nutrition Status:         DIET-->NPO Constipation protocol as indicated  ENDO - will use ICU hypoglycemic\Hyperglycemia protocol if indicated   ELECTROLYTES -follow labs as needed -replace as needed -pharmacy consultation and following   DVT/GI PRX ordered TRANSFUSIONS AS NEEDED MONITOR FSBS ASSESS the need for LABS as needed   Critical Care Time devoted to patient care services described in this note is 32 minutes.   Overall, patient is critically ill, prognosis is guarded.  Patient with Multiorgan failure and at high risk for cardiac arrest and death.   Recommend PALLIATIVE CARE  RECOMMEND DNR/DNI STATUS  Corrin Parker, M.D.  [facility]   Medical Director [place] Director [facility] Cardio-Pulmonary Department

## 2019-12-12 NOTE — Progress Notes (Signed)
PALLIATIVE NOTE:  Consult received for goals of care and code status. Chart reviewed and updates received from [place], [place]. Patient unable to engage in goals of care discussion appropriately.   I was able to speak with patient's son, [name] via phone. I introduced myself and Palliative's role in Ms. Fiebelkorn's care while she is hospitalized. He verbalized understanding. Mr. Bohan requested updates on his mother, which was provided. Questions answered.   Offered to meet for further goals of care discussion. Son is unavailable to meet face-to-face but request for phone discussion tomorrow 12/13/19 @ 11am. Confirmed date and time request. He is aware I will give him a call tomorrow at the specified time and the listed number on file.   Detailed note and recommendations to follow [place] discussion.   Thank you for your referral and allowing Palliative to assist in Mrs. Brooker's care.   Alda Lea, AGPCNP-BC Palliative Medicine Team  Phone: [phone] Pager: [phone] Amion: N. [place]   NO CHARGE

## 2019-12-12 NOTE — Progress Notes (Signed)
[facility]  ROUNDING NOTE   Subjective:  Patient well-known to [place] from prior admission. Presents now with sepsis most likely from decubitus ulcer versus UTI. She normally dialyzes on TTS dialysis schedule. Currently not verbalizing. Proteus noted in the blood.  Objective:  Vital signs in last 24 hours:  Temp:  [95 F (35 C)-101.2 F (38.4 C)] 98.1 F (36.7 C) (05/10 1253) Pulse Rate:  [66-114] 73 (05/10 1253) Resp:  [11-34] 14 (05/10 1253) BP: (84-178)/(33-63) 99/50 (05/10 1200) SpO2:  [85 %-100 %] 100 % (05/10 1253) Weight:  [75 kg-75.8 kg] 75.8 kg (05/10 0025)  Weight change:  Filed Weights   12/11/19 1944 12/12/19 0025  Weight: 75 kg 75.8 kg    Intake/Output: I/O last 3 completed shifts: In: 758.6 [I.V.:336.8; IV Piggyback:421.8] Out: 100 [Urine:100]   Intake/Output this shift:  Total I/O In: 581.3 [I.V.:184.8; IV Piggyback:396.5] Out: -   Physical Exam: General: Critically ill-appearing  Head: Normocephalic, atraumatic. Moist oral mucosal membranes  Eyes: Anicteric  Neck: Supple, trachea midline  Lungs:  Clear to auscultation, normal effort  Heart: S1S2 no rubs  Abdomen:  Soft, nontender, bowel sounds present  Extremities: Trace peripheral edema.  Neurologic: Lethargic, difficult to arouse  Skin: Warm/dry  Access: Right IJ PermCath    Basic Metabolic Panel: Recent Labs  Lab 12/11/19 1945 12/12/19 0411 12/12/19 1115  NA 138 135  --   K 2.5* 2.7* 3.5  CL 102 106  --   CO2 26 23  --   GLUCOSE 137* 180*  --   BUN 20 20  --   CREATININE 2.27* 2.14*  --   CALCIUM 7.9* 7.0*  --   MG 1.9 1.4*  --   PHOS <1.0* 1.7* 2.2*    Liver Function Tests: Recent Labs  Lab 12/11/19 1945  AST 30  ALT 23  ALKPHOS 90  BILITOT 0.4  PROT 5.1*  ALBUMIN 1.5*   No results for input(s): LIPASE, AMYLASE in the last 168 hours. Recent Labs  Lab 12/11/19 1945  AMMONIA 44*    CBC: Recent Labs  Lab 12/11/19 1945 12/12/19 0411  WBC 11.5*  15.8*  NEUTROABS 9.9* 13.9*  HGB 8.3* 7.9*  HCT 29.4* 26.7*  MCV 95.8 92.1  PLT 141* 134*    Cardiac Enzymes: No results for input(s): CKTOTAL, CKMB, CKMBINDEX, TROPONINI in the last 168 hours.  BNP: Invalid input(s): POCBNP  CBG: Recent Labs  Lab 12/12/19 0033 12/12/19 0722 12/12/19 1126  GLUCAP 135* 94 130*    Microbiology: Results for orders placed or performed during the hospital encounter of 12/11/19  Blood Culture (routine x 2)     Status: None (Preliminary result)   Collection Time: 12/11/19  7:45 PM   Specimen: BLOOD  Result Value Ref Range Status   Specimen Description BLOOD RIGHT FA  Final   Special Requests   Final    BOTTLES DRAWN AEROBIC AND ANAEROBIC Blood Culture adequate volume   Culture  Setup Time   Final    Organism ID to follow GRAM NEGATIVE RODS IN BOTH AEROBIC AND ANAEROBIC BOTTLES CRITICAL RESULT CALLED TO, READ BACK BY AND VERIFIED WITHRayna Sexton AT 5681 ON 12/12/19 SNG Performed at [facility], [address], [place], [place] [address]    Culture GRAM NEGATIVE RODS  Final   Report Status PENDING  Incomplete  Blood Culture (routine x 2)     Status: None (Preliminary result)   Collection Time: 12/11/19  7:45 PM   Specimen: BLOOD  Result Value Ref Range Status   Specimen Description BLOOD RIGHT ARM  Final   Special Requests   Final    BOTTLES DRAWN AEROBIC AND ANAEROBIC Blood Culture adequate volume   Culture  Setup Time   Final    GRAM NEGATIVE RODS IN BOTH AEROBIC AND ANAEROBIC BOTTLES CRITICAL VALUE NOTED.  VALUE IS CONSISTENT WITH PREVIOUSLY REPORTED AND CALLED VALUE. Performed at [facility], [address] [place], [place] [address]    Culture GRAM NEGATIVE RODS  Final   Report Status PENDING  Incomplete  Blood Culture ID Panel (Reflexed)     Status: Abnormal   Collection Time: 12/11/19  7:45 PM  Result Value Ref Range Status   Enterococcus species NOT DETECTED NOT DETECTED Final   Listeria monocytogenes  NOT DETECTED NOT DETECTED Final   Staphylococcus species NOT DETECTED NOT DETECTED Final   Staphylococcus aureus (BCID) NOT DETECTED NOT DETECTED Final   Streptococcus species NOT DETECTED NOT DETECTED Final   Streptococcus agalactiae NOT DETECTED NOT DETECTED Final   Streptococcus pneumoniae NOT DETECTED NOT DETECTED Final   Streptococcus pyogenes NOT DETECTED NOT DETECTED Final   Acinetobacter baumannii NOT DETECTED NOT DETECTED Final   Enterobacteriaceae species DETECTED (A) NOT DETECTED Final    Comment: Enterobacteriaceae represent a large family of gram-negative bacteria, not a single organism. CRITICAL RESULT CALLED TO, READ BACK BY AND VERIFIED WITH: Rayna Sexton AT [phone] ON 12/12/19 SNG    Enterobacter cloacae complex NOT DETECTED NOT DETECTED Final   Escherichia coli NOT DETECTED NOT DETECTED Final   Klebsiella oxytoca NOT DETECTED NOT DETECTED Final   Klebsiella pneumoniae NOT DETECTED NOT DETECTED Final   Proteus species DETECTED (A) NOT DETECTED Final    Comment: CRITICAL RESULT CALLED TO, READ BACK BY AND VERIFIED WITH: Rayna Sexton AT 8299 ON 12/12/19 SNG    Serratia marcescens NOT DETECTED NOT DETECTED Final   Carbapenem resistance NOT DETECTED NOT DETECTED Final   Haemophilus influenzae NOT DETECTED NOT DETECTED Final   Neisseria meningitidis NOT DETECTED NOT DETECTED Final   Pseudomonas aeruginosa NOT DETECTED NOT DETECTED Final   Candida albicans NOT DETECTED NOT DETECTED Final   Candida glabrata NOT DETECTED NOT DETECTED Final   Candida krusei NOT DETECTED NOT DETECTED Final   Candida parapsilosis NOT DETECTED NOT DETECTED Final   Candida tropicalis NOT DETECTED NOT DETECTED Final    Comment: Performed at [facility], [address] [place], [place] [address]  Respiratory Panel by RT PCR (Flu A&B, Covid) - Nasopharyngeal Swab     Status: None   Collection Time: 12/11/19  9:17 PM   Specimen: Nasopharyngeal Swab  Result Value Ref Range Status   SARS  Coronavirus 2 by RT PCR NEGATIVE NEGATIVE Final    Comment: (NOTE) SARS-CoV-2 target nucleic acids are NOT DETECTED. The SARS-CoV-2 RNA is generally detectable in upper respiratoy specimens during the acute phase of infection. The lowest concentration of SARS-CoV-2 viral copies this assay can detect is 131 copies/mL. A negative result does not preclude SARS-Cov-2 infection and should not be used as the sole basis for treatment or other patient management decisions. A negative result may occur with  improper specimen collection/handling, submission of specimen other than nasopharyngeal swab, presence of viral mutation(s) within the areas targeted by this assay, and inadequate number of viral copies (<131 copies/mL). A negative result must be combined with clinical observations, patient history, and epidemiological information. The expected result is Negative. Fact Sheet for Patients:  PinkCheek.be Fact Sheet for  Healthcare Providers:  GravelBags.it This test is not yet ap proved or cleared by the [place] and  has been authorized for detection and/or diagnosis of SARS-CoV-2 by FDA under an Emergency Use Authorization (EUA). This EUA will remain  in effect (meaning this test can be used) for the duration of the COVID-19 declaration under Section 564(b)(1) of the Act, 21 U.S.C. section 360bbb-3(b)(1), unless the authorization is terminated or revoked sooner.    Influenza A by PCR NEGATIVE NEGATIVE Final   Influenza B by PCR NEGATIVE NEGATIVE Final    Comment: (NOTE) The Xpert Xpress SARS-CoV-2/FLU/RSV assay is intended as an aid in  the diagnosis of influenza from Nasopharyngeal swab specimens and  should not be used as a sole basis for treatment. Nasal washings and  aspirates are unacceptable for Xpert Xpress SARS-CoV-2/FLU/RSV  testing. Fact Sheet for Patients: PinkCheek.be Fact Sheet  for Healthcare Providers: GravelBags.it This test is not yet approved or cleared by the [place] FDA and  has been authorized for detection and/or diagnosis of SARS-CoV-2 by  FDA under an Emergency Use Authorization (EUA). This EUA will remain  in effect (meaning this test can be used) for the duration of the  Covid-19 declaration under Section 564(b)(1) of the Act, 21  U.S.C. section 360bbb-3(b)(1), unless the authorization is  terminated or revoked. Performed at [facility], [address] [place], [place] [address]   MRSA PCR Screening     Status: None   Collection Time: 12/12/19 12:29 AM   Specimen: Nasopharyngeal  Result Value Ref Range Status   MRSA by PCR NEGATIVE NEGATIVE Final    Comment:        The GeneXpert MRSA Assay (FDA approved for NASAL specimens only), is one component of a comprehensive MRSA colonization surveillance program. It is not intended to diagnose MRSA infection nor to guide or monitor treatment for MRSA infections. Performed at [facility], [address] [place],  [address]     Coagulation Studies: Recent Labs    12/11/19 1945  LABPROT 18.5*  INR 1.6*    Urinalysis: Recent Labs    12/11/19 2009  COLORURINE AMBER*  LABSPEC 1.011  PHURINE 7.0  GLUCOSEU 50*  HGBUR SMALL*  BILIRUBINUR NEGATIVE  KETONESUR NEGATIVE  PROTEINUR >=300*  NITRITE NEGATIVE  LEUKOCYTESUR MODERATE*      Imaging: CT ABDOMEN PELVIS WO CONTRAST  Result Date: 12/11/2019 CLINICAL DATA:  Unresponsive. EXAM: CT ABDOMEN AND PELVIS WITHOUT CONTRAST TECHNIQUE: Multidetector CT imaging of the abdomen and pelvis was performed following the standard protocol without IV contrast. COMPARISON:  March 29, 2017 FINDINGS: Lower chest: No acute abnormality. Hepatobiliary: No focal liver abnormality is seen. No gallstones, gallbladder wall thickening, or biliary dilatation. Pancreas: Unremarkable. No pancreatic ductal  dilatation or surrounding inflammatory changes. Spleen: Normal in size without focal abnormality. Adrenals/Urinary Tract: Adrenal glands are unremarkable. Kidneys are normal in size, without focal lesions or hydronephrosis. A 6 mm nonobstructing renal stone is seen within the lower pole of the left kidney. A Foley catheter is seen within the urinary bladder. Stomach/Bowel: There is a small hiatal hernia. The appendix is not identified. Surgically anastomosed bowel is seen within the mid sigmoid colon. Noninflamed diverticula are also seen within the proximal sigmoid colon. No evidence of bowel dilatation. Vascular/Lymphatic: Moderate to marked severity aortic calcification. No enlarged abdominal or pelvic lymph nodes. Reproductive: Status post hysterectomy. No adnexal masses. Other: There is a predominant stable 11.9 cm x 3.7 cm ventral hernia seen just above  the level of the umbilicus. This contains fat and a segment of nondilated transverse colon. An additional 13.4 cm x 6.2 cm ventral hernia is seen along the anterior aspect of the lower pelvic wall. This is mildly increased in size when compared to the prior study and contains fat and multiple nondilated loops of distal small bowel. Musculoskeletal: Multilevel degenerative changes seen throughout the lumbar spine with a metallic density fusion plate and screws seen along the anterior aspect of the levels of L5 and S1. This represents a new finding when compared to the prior exam. The multiple bilateral pedicle screws seen throughout the lumbar spine on the prior study have been removed. Additional postoperative changes are seen involving the posterior elements of the lumbar spine. A 6.2 cm x 4.5 cm area of air and fluid attenuation is seen posterior to the spinal canal at the level of the L4 vertebral body. Tiny foci of air are seen within the prevertebral soft tissues at the level of L4-L5, and within the adjacent portion of the lower left hemipelvis. These  extend to the region adjacent to the mid sigmoid colon. A 3.2 cm x 3.1 cm x 2.0 cm soft tissue defect is seen along the posterior aspect of the lumbar spine at the level of L3 and L4. A 3.4 cm by 0.5 cm sacral decubitus ulcer is seen along the mid to lower sacrum, to the left of midline. A small amount of adjacent cortical destruction is seen. IMPRESSION: 1. Extensive postoperative changes within the lumbar spine, as described above, with a 6.2 cm x 4.5 cm area of air and fluid attenuation seen posterior to the spinal canal at the level of the L4 vertebral body. This may represent a postoperative collection/abscess. 2. Large, ventral hernias, as described above. 3. Sacral decubitus ulcer with additional findings suggestive of acute osteomyelitis. 4. Stable 6 mm nonobstructing renal stone within the left kidney. 5. Sigmoid diverticulosis. Aortic Atherosclerosis (ICD10-I70.0). Electronically Signed   By: Virgina Norfolk M.D.   On: 12/11/2019 22:02   CT HEAD WO CONTRAST  Result Date: 12/11/2019 CLINICAL DATA:  Unresponsive. EXAM: CT HEAD WITHOUT CONTRAST TECHNIQUE: Contiguous axial images were obtained from the base of the skull through the vertex without intravenous contrast. COMPARISON:  November 19, 2019 FINDINGS: Brain: There is mild to moderate severity cerebral atrophy with widening of the extra-axial spaces and ventricular dilatation. There are areas of decreased attenuation within the white matter tracts of the supratentorial brain, consistent with microvascular disease changes. Vascular: No hyperdense vessel or unexpected calcification. Skull: Normal. Negative for fracture or focal lesion. Sinuses/Orbits: There is mild sphenoid sinus mucosal thickening. Other: None. IMPRESSION: 1. No acute intracranial abnormality. 2. Mild to moderate severity cerebral atrophy and microvascular disease changes of the supratentorial brain. 3. Mild sphenoid sinus disease. Electronically Signed   By: Virgina Norfolk M.D.    On: 12/11/2019 21:32   DG Chest Port 1 View  Result Date: 12/12/2019 CLINICAL DATA:  Central line placement EXAM: PORTABLE CHEST 1 VIEW COMPARISON:  12/11/2019 FINDINGS: Single frontal view of the chest demonstrates left internal jugular catheter tip overlying superior vena cava. Stable right internal jugular dialysis catheter. The cardiac silhouette is enlarged. There is central vascular congestion, with interval development of patchy bilateral perihilar ground-glass airspace disease. No effusion or pneumothorax. No acute bony abnormalities. IMPRESSION: 1. No complication after left internal jugular catheter placement. 2. Worsening volume status, with development of mild pulmonary edema. Electronically Signed   By: [name].D.  On: 12/12/2019 02:03   DG Chest Port 1 View  Result Date: 12/11/2019 CLINICAL DATA:  79 year old female with sepsis. EXAM: PORTABLE CHEST 1 VIEW COMPARISON:  Chest radiograph dated 11/19/2019. FINDINGS: Dialysis catheter in similar position. There is stable cardiomegaly. Probable mild vascular congestion. No edema. No focal consolidation, pleural effusion, pneumothorax. Atherosclerotic calcification of the aorta. No acute osseous pathology. Degenerative changes of the shoulders. IMPRESSION: No focal consolidation.  Probable mild vascular congestion. Electronically Signed   By: Anner Crete M.D.   On: 12/11/2019 20:33     Medications:   . sodium chloride 5 mL/hr at 12/12/19 1200  . ceFEPime (MAXIPIME) IV    . metronidazole    . norepinephrine (LEVOPHED) Adult infusion 7 mcg/min (12/12/19 1200)  . [START ON 12/13/2019] vancomycin     . chlorhexidine  15 mL Mouth Rinse BID  . Chlorhexidine Gluconate Cloth  6 each Topical Q0600  . heparin  5,000 Units Subcutaneous Q8H  . mouth rinse  15 mL Mouth Rinse q12n4p  . sodium chloride flush  10-40 mL Intracatheter Q12H   acetaminophen, docusate sodium, ipratropium-albuterol, morphine injection, ondansetron (ZOFRAN)  IV, polyethylene glycol, sodium chloride flush  Assessment/ Plan:  79 y.o. female  is a 79 y.o. black female with end stage renal disease on hemodialysis, dementia, diabetes mellitus type II, hypertension, hypothyroidism, osteomyelitis, now admitted with decreased responsiveness and sepsis.  [facility] [address] TTS RIJ [phone].  1.  ESRD on HD TTS.  No acute indication for dialysis at the moment as her potassium is noted to be quite low.  Reassess for dialysis tomorrow.  2.  Altered mental status.  Likely secondary to underlying sepsis.  Quite lethargic at the moment.  3.  Anemia of chronic kidney disease.   Lab Results  Component Value Date   HGB 7.9 (L) 12/12/2019  Consider transfusion for hemoglobin of 7 or less.  4.  Secondary hyperparathyroidism.  Continue to monitor bone metabolism parameters over the course of the hospitalization.  5.  Sepsis.  Proteus growing in the blood.  Patient with underlying decubitus ulcer and possible urinary tract infection.  Antibiotic management as per hospitalist and critical care.   LOS: 1 Taylor Tyler 5/10/20211:05 PM

## 2019-12-12 NOTE — Progress Notes (Signed)
Hemodialysis patient known at [facility] TTS 9:20am, patient is a resident at [facility]. Please contact me with any dialysis placement concerns.  Elvera Bicker Dialysis Coordinator [phone]

## 2019-12-12 NOTE — Progress Notes (Signed)
5/9 Code Sepsis patient. Called lab to inquire why second LA was not resulted since stat LA was entered by NP when patient was admitted to the ICU. Lab informed sepsis team that when labs were collected on admit to ICU, the order fell off when collection manager was completed and LA was not sent. Lab will enter order stat to collect now. Repeat q 2 hour LA was to be collected in ED and delayed until patient was admitted to ICU.

## 2019-12-13 ENCOUNTER — Telehealth: Payer: Self-pay

## 2019-12-13 ENCOUNTER — Ambulatory Visit: Payer: Medicare Other

## 2019-12-13 DIAGNOSIS — R7881 Bacteremia: Secondary | ICD-10-CM

## 2019-12-13 DIAGNOSIS — Z515 Encounter for palliative care: Secondary | ICD-10-CM

## 2019-12-13 DIAGNOSIS — M4620 Osteomyelitis of vertebra, site unspecified: Secondary | ICD-10-CM

## 2019-12-13 DIAGNOSIS — Z7189 Other specified counseling: Secondary | ICD-10-CM

## 2019-12-13 LAB — GLUCOSE, CAPILLARY
Glucose-Capillary: 63 mg/dL — ABNORMAL LOW (ref 70–99)
Glucose-Capillary: 70 mg/dL (ref 70–99)
Glucose-Capillary: 74 mg/dL (ref 70–99)
Glucose-Capillary: 76 mg/dL (ref 70–99)
Glucose-Capillary: 77 mg/dL (ref 70–99)
Glucose-Capillary: 84 mg/dL (ref 70–99)
Glucose-Capillary: 94 mg/dL (ref 70–99)

## 2019-12-13 LAB — CBC WITH DIFFERENTIAL/PLATELET
Abs Immature Granulocytes: 0.34 10*3/uL — ABNORMAL HIGH (ref 0.00–0.07)
Basophils Absolute: 0.1 10*3/uL (ref 0.0–0.1)
Basophils Relative: 1 %
Eosinophils Absolute: 0.1 10*3/uL (ref 0.0–0.5)
Eosinophils Relative: 1 %
HCT: 26.2 % — ABNORMAL LOW (ref 36.0–46.0)
Hemoglobin: 8 g/dL — ABNORMAL LOW (ref 12.0–15.0)
Immature Granulocytes: 3 %
Lymphocytes Relative: 8 %
Lymphs Abs: 1 10*3/uL (ref 0.7–4.0)
MCH: 27.3 pg (ref 26.0–34.0)
MCHC: 30.5 g/dL (ref 30.0–36.0)
MCV: 89.4 fL (ref 80.0–100.0)
Monocytes Absolute: 0.5 10*3/uL (ref 0.1–1.0)
Monocytes Relative: 4 %
Neutro Abs: 11.7 10*3/uL — ABNORMAL HIGH (ref 1.7–7.7)
Neutrophils Relative %: 83 %
Platelets: 109 10*3/uL — ABNORMAL LOW (ref 150–400)
RBC: 2.93 MIL/uL — ABNORMAL LOW (ref 3.87–5.11)
RDW: 21.8 % — ABNORMAL HIGH (ref 11.5–15.5)
WBC: 13.8 10*3/uL — ABNORMAL HIGH (ref 4.0–10.5)
nRBC: 0.3 % — ABNORMAL HIGH (ref 0.0–0.2)

## 2019-12-13 LAB — BASIC METABOLIC PANEL
Anion gap: 8 (ref 5–15)
BUN: 29 mg/dL — ABNORMAL HIGH (ref 8–23)
CO2: 22 mmol/L (ref 22–32)
Calcium: 7.5 mg/dL — ABNORMAL LOW (ref 8.9–10.3)
Chloride: 107 mmol/L (ref 98–111)
Creatinine, Ser: 2.67 mg/dL — ABNORMAL HIGH (ref 0.44–1.00)
GFR calc Af Amer: 19 mL/min — ABNORMAL LOW (ref >60)
GFR calc non Af Amer: 17 mL/min — ABNORMAL LOW (ref >60)
Glucose, Bld: 88 mg/dL (ref 70–99)
Potassium: 3.1 mmol/L — ABNORMAL LOW (ref 3.5–5.1)
Sodium: 137 mmol/L (ref 135–145)

## 2019-12-13 LAB — PROCALCITONIN: Procalcitonin: 3.63 ng/mL

## 2019-12-13 LAB — PHOSPHORUS: Phosphorus: 2.7 mg/dL (ref 2.5–4.6)

## 2019-12-13 LAB — MAGNESIUM: Magnesium: 2.8 mg/dL — ABNORMAL HIGH (ref 1.7–2.4)

## 2019-12-13 MED ORDER — DEXTROSE 50 % IV SOLN
12.5000 g | INTRAVENOUS | Status: AC
  Administered 2019-12-13: 12.5 g via INTRAVENOUS

## 2019-12-13 MED ORDER — LIDOCAINE-PRILOCAINE 2.5-2.5 % EX CREA
1.0000 "application " | TOPICAL_CREAM | CUTANEOUS | Status: DC | PRN
  Filled 2019-12-13: qty 5

## 2019-12-13 MED ORDER — DEXTROSE 50 % IV SOLN
INTRAVENOUS | Status: AC
  Filled 2019-12-13: qty 50

## 2019-12-13 MED ORDER — SODIUM CHLORIDE 0.9 % IV SOLN: 100.0000 mL | INTRAVENOUS | Status: DC | PRN

## 2019-12-13 MED ORDER — DEXTROSE 5 % IV SOLN: INTRAVENOUS | Status: DC

## 2019-12-13 MED ORDER — LIDOCAINE HCL (PF) 1 % IJ SOLN
5.0000 mL | INTRAMUSCULAR | Status: DC | PRN
  Filled 2019-12-13: qty 5

## 2019-12-13 MED ORDER — EPOETIN ALFA 10000 UNIT/ML IJ SOLN
10000.0000 [IU] | INTRAMUSCULAR | Status: DC
  Administered 2019-12-15 – 2019-12-27 (×5): 10000 [IU] via INTRAVENOUS
  Filled 2019-12-13 (×5): qty 1

## 2019-12-13 MED ORDER — CHLORHEXIDINE GLUCONATE CLOTH 2 % EX PADS
6.0000 | MEDICATED_PAD | Freq: Every day | CUTANEOUS | Status: DC
  Administered 2019-12-13: 6 via TOPICAL

## 2019-12-13 MED ORDER — PENTAFLUOROPROP-TETRAFLUOROETH EX AERO
1.0000 "application " | INHALATION_SPRAY | CUTANEOUS | Status: DC | PRN
  Filled 2019-12-13: qty 30

## 2019-12-13 MED ORDER — HEPARIN SODIUM (PORCINE) 1000 UNIT/ML DIALYSIS: 1000.0000 [IU] | INTRAMUSCULAR | Status: DC | PRN

## 2019-12-13 MED ORDER — ALTEPLASE 2 MG IJ SOLR: 2.0000 mg | Freq: Once | INTRAMUSCULAR | Status: DC | PRN

## 2019-12-13 MED ORDER — ALBUMIN HUMAN 25 % IV SOLN
25.0000 g | INTRAVENOUS | Status: DC
  Administered 2019-12-13 – 2019-12-22 (×4): 25 g via INTRAVENOUS
  Filled 2019-12-13 (×7): qty 100

## 2019-12-13 NOTE — Consult Note (Signed)
Consultation Note Date: 12/13/2019   Patient Name: Taylor Tyler  DOB: 1940-09-04  MRN: [id number]  Age / Sex: 79 y.o., female   PCP: McLean-Scocuzza, Nino Glow, MD Referring Physician: Flora Lipps, MD   REASON FOR CONSULTATION:Establishing goals of care  Palliative Care consult requested for goals of care discussion in this 79 y.o. female with multiple medical problems including end-stage renal disease on dialysis(T/TH/S),Covid + inDecember 2020,  repeat Covid test was negative on 11/03/19 and 12/11/19, dementia, nonhealing scaral decubitus, and diabetes mellitus. Patient presented to ED from [facility] facility with complaints of altered mental status. During work-up UA positive for UTI. CT suggestive of osteomyelitis along with L4 vertebral body postoperative collection/abscess requiring levophed gtt for hypotension/septic shock. Head CT on 12/11/19 showed no acute abnormalities. Mild to moderate severity cerebral atrophy and microvascular disease changes. Since admission patient remains lethargic and requiring pressors. She is being followed by Nephrology. Receiving IV antibiotics.    Clinical Assessment and Goals of Care: I have reviewed medical records including lab results, imaging, Epic notes, and MAR, received report from the bedside RN, and assessed the patient. I was able to speak with son, Taylor Tyler via phone to discuss diagnosis prognosis, [name], EOL wishes, disposition and options.  I introduced Palliative Medicine as specialized medical care for people living with serious illness. It focuses on providing relief from the symptoms and stress of a serious illness. The goal is to improve quality of life for both the patient and the family.   We discussed a brief life review of the patient, along with her functional and nutritional status.  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 She has 2 sons who are involved in her care Taylor Tyler and Taylor Tyler). Son reports patient lived alone  and was independent until she had a fall and required a prolonged hospitalization (Oct  2020). Patient was initiated on HD during this admission also. Son reports patient was diagnosed with dementia over 3 years ago, however she was alert and oriented with some memory deficits. She was able to cover it well until her fall last year and since has seemed to progress with memory loss.   Per son, patient has been in a facility since November 2020 post hospitalization and health has seem to decline since then. He reports some days are better than others regarding patient's memory. Family all visited her at the facility for Mother's Day and patient seemed weak but was able to recognize all of her family.   We discussed Her current illness and what it means in the larger context of Her on-going co-morbidities. Natural disease trajectory and expectations at EOL were discussed.  Updates provided to [name]. We had a detailed discussion regarding progression of dementia, aspiration, continued infection/sepsis due to her unstageable nonhealing sacral wound. Son expressed concerns regarding the care of wound and infection. Detailed discussion regarding poor wound healing, high susceptibility of continued infection despite multiple antibiotics and treatments. Son verbalized understanding. Taylor Tyler expressed sadness seeing his mother in her current state and her health declining so rapidly over the past 6-36months. He shares he and his brother have been in conversation and are both on the same page with awareness that she will pass away and is not getting better. Support given.  I attempted to elicit values and goals of care important to the patient.    Advanced directives, concepts specific to code status, artifical feeding and hydration, and rehospitalization were considered and discussed. Patient does  have a documented advanced directive.   I discussed at length patient's full code status with consideration to her  current illness, co-morbidities, and poor prognosis. Taylor Tyler verbalized his understanding. He again would not want his mother to suffer but would like to further discuss with his brother. He also states patient has a living will and he is planning to go to her attorney and obtain the document to see if she mentions her wishes in the document before he and his brother make final decisions.   Questions and concerns were addressed. The family was encouraged to call with questions or concerns.  PMT will continue to support holistically.   SOCIAL HISTORY:     reports that she has never smoked. She has never used smokeless tobacco. She reports previous alcohol use. She reports that she does not use drugs.  CODE STATUS: Full code  ADVANCE DIRECTIVES: Children Taylor Tyler and Taylor Tyler)    SYMPTOM MANAGEMENT: per attending   Palliative Prophylaxis:   Aspiration, Bowel Regimen, Frequent Pain Assessment, Oral Care, Palliative Wound Care and Turn Reposition  PSYCHO-SOCIAL/SPIRITUAL:  Support System: Family  Desire for further Chaplaincy support: No   Additional Recommendations (Limitations, Scope, Preferences):  Full Scope Treatment   PAST MEDICAL HISTORY: Past Medical History:  Diagnosis Date  . Acute blood loss anemia 03/14/2017  . Acute renal failure superimposed on stage 3 chronic kidney disease ([place]) 03/14/2017  . AKI (acute kidney injury) ([place]) 02/28/2017  . Altered mental status 03/29/2017  . Anemia   . Aortic atherosclerosis ([place])   . Arthritis    "joints might ache at times; not that bad" (06/10/2018)  . Bilateral lower extremity edema 03/04/2017  . Bradycardia   . Chronic kidney disease    ?? renal insufficiency,   . CKD (chronic kidney disease) stage 3, GFR 30-59 ml/min 01/15/2017   ?? renal insufficiency, which she thinks is coming from "all these medications"  . COVID-19    10/17/19  . Dementia ([place])   . Diabetes mellitus without complication ([place])    diagnosed 4-5 yrs ago,  06/21/18- "that was years ago"  . Disease of pancreas   . Diverticulitis    s/p perforation and partial colectomy 01/27/14 with 3 benign lymph nodes   . Diverticulosis 01/15/2017  . DJD (degenerative joint disease)   . DM (diabetes mellitus), type 2 with renal complications ([place]) 1/61/0960  . Elevated ferritin level   . Fatty liver   . Hypertension   . Hypothyroidism    "had radiation" (06/10/2018)  . Kidney disease, chronic, stage V (GFR under 15 ml/min) ([place])    per notes from nursing home-she is on dialysis  . Kidney stone   . Lethargy 02/28/2017  . Obesity, Class III, BMI 40-49.9 (morbid obesity) ([place]) 02/27/2017  . Pleural lipoma   . Postoperative wound infection 04/02/2017  . Spinal stenosis of lumbar region 01/15/2017  . Status post lumbar surgery   . Wound healing, delayed    back    PAST SURGICAL HISTORY:  Past Surgical History:  Procedure Laterality Date  . ABDOMINAL EXPOSURE N/A 02/24/2017   Procedure: ABDOMINAL EXPOSURE;  Surgeon: Angelia Mould, MD;  Location: [place];  Service: Vascular;  Laterality: N/A;  . ABDOMINAL HYSTERECTOMY  1987   no h/o abnormal paps   . ANTERIOR LAT LUMBAR FUSION N/A 02/24/2017   Procedure: Lumbar three- five Anterior lateral lumbar interbody fusion;  Surgeon: Ditty, Kevan Ny, MD;  Location: [place];  Service: Neurosurgery;  Laterality: N/A;  L3-5  Anterior lateral lumbar interbody fusion with removal of coflex at L3-4, L4-5  . ANTERIOR LUMBAR FUSION N/A 02/24/2017   Procedure: Stage 1: Lumbar five-Sacral one Anterior lumbar interbody fusion;  Surgeon: Ditty, Kevan Ny, MD;  Location: [place];  Service: Neurosurgery;  Laterality: N/A;  Stage 1: L5-S1 Anterior lumbar interbody fusion  . APPENDECTOMY    . APPLICATION OF ROBOTIC ASSISTANCE FOR SPINAL PROCEDURE N/A 02/26/2017   Procedure: APPLICATION OF ROBOTIC ASSISTANCE FOR SPINAL PROCEDURE;  Surgeon: Ditty, Kevan Ny, MD;  Location: [place];  Service: Neurosurgery;  Laterality: N/A;    . APPLICATION OF WOUND VAC N/A 03/30/2017   Procedure: APPLICATION OF WOUND VAC;  Surgeon: Ditty, Kevan Ny, MD;  Location: [place];  Service: Neurosurgery;  Laterality: N/A;  . APPLICATION OF WOUND VAC N/A 06/23/2019   Procedure: APPLICATION OF WOUND VAC;  Surgeon: Consuella Lose, MD;  Location: [place];  Service: Neurosurgery;  Laterality: N/A;  . AV FISTULA PLACEMENT Left 06/08/2019   Procedure: Arteriovenous (Av) Fistula Creation;  Surgeon: Rosetta Posner, MD;  Location: [place];  Service: Vascular;  Laterality: Left;  . BACK SURGERY  2013  . CATARACT EXTRACTION W/ INTRAOCULAR LENS IMPLANT Right   . CHOLECYSTECTOMY OPEN  1982  . COLON SURGERY  01/26/2014   desc.sigmoid colectomy and ventral hernia repair and splenic flexure mobilization  . COLON SURGERY    . DILATION AND CURETTAGE OF UTERUS    . HERNIA REPAIR    . INSERTION OF DIALYSIS CATHETER Right 06/08/2019   Procedure: INSERTION OF DIALYSIS CATHETER RIGHT INTERNAL JUGULAR;  Surgeon: Rosetta Posner, MD;  Location: [place];  Service: Vascular;  Laterality: Right;  . LUMBAR LAMINECTOMY WITH SPINOUS PROCESS PLATE 2 LEVEL N/A 01/15/4314   Procedure: LUMBAR LAMINECTOMY/DECOMPRESSION MICRODISCECTOMY CoFlex;  Surgeon: Faythe Ghee, MD;  Location: [facility];  Service: Neurosurgery;  Laterality: N/A;  Lumbar three-four,Lumbar Four-Five Laminectomy with Coflex  . LUMBAR WOUND DEBRIDEMENT N/A 03/30/2017   Procedure: Lumbar wound exploration/debridement, placement of wound vac;  Surgeon: Ditty, Kevan Ny, MD;  Location: [place];  Service: Neurosurgery;  Laterality: N/A;  Lumbar wound exploration/debridement, placement of wound vac  . LUMBAR WOUND DEBRIDEMENT N/A 06/06/2018   Procedure: LUMBAR WOUND DEBRIDEMENT/EXPLORATION;  Surgeon: Consuella Lose, MD;  Location: [place];  Service: Neurosurgery;  Laterality: N/A;  . LUMBAR WOUND DEBRIDEMENT N/A 06/22/2018   Procedure: SIMPLE INCISION AND DRAINAGE OF WOUND, APPLICATION OF WOUND VAC;  Surgeon:  Consuella Lose, MD;  Location: [name];  Service: Neurosurgery;  Laterality: N/A;  . LUMBAR WOUND DEBRIDEMENT N/A 06/23/2019   Procedure: LUMBAR WOUND DEBRIDEMENT WITH HARDWARE REMOVAL;  Surgeon: Consuella Lose, MD;  Location: [place];  Service: Neurosurgery;  Laterality: N/A;  . PARTIAL COLECTOMY     01/27/14 diverticulitis and 3 benign lymph nodes [facility] Dr. Pat Patrick   . REDUCTION MAMMAPLASTY Bilateral 1992  . THROMBECTOMY BRACHIAL ARTERY Left 06/08/2019   Procedure: Thrombectomy Brachial Artery;  Surgeon: Rosetta Posner, MD;  Location: [place];  Service: Vascular;  Laterality: Left;  . TUBAL LIGATION    . VENTRAL HERNIA REPAIR  2014    ALLERGIES:  is allergic to clams [shellfish allergy]; hydralazine; norvasc [amlodipine besylate]; and milk-related compounds.   MEDICATIONS:  Current Facility-Administered Medications  Medication Dose Route Frequency Provider Last Rate Last Admin  . 0.9 %  sodium chloride infusion  250 mL Intravenous Continuous Delman Kitten, MD   Stopped at 12/12/19 2140  . acetaminophen (TYLENOL) tablet 650 mg  650 mg Oral Q4H  PRN Awilda Bill, NP      . albumin human 25 % solution 25 g  25 g Intravenous Q T,Th,Sa-HD Lateef, [place], MD      . budesonide (PULMICORT) nebulizer solution 0.25 mg  0.25 mg Nebulization BID Taylor Lipps, MD   0.25 mg at 12/13/19 0729  . ceFEPIme (MAXIPIME) 1 g in sodium chloride 0.9 % 100 mL IVPB  1 g Intravenous Q24H Taylor Lipps, MD   Stopped at 12/12/19 2040  . chlorhexidine (PERIDEX) 0.12 % solution 15 mL  15 mL Mouth Rinse BID Taylor Lipps, MD   15 mL at 12/13/19 0942  . Chlorhexidine Gluconate Cloth 2 % PADS 6 each  6 each Topical Q0600 Awilda Bill, NP   6 each at 12/13/19 0538  . Chlorhexidine Gluconate Cloth 2 % PADS 6 each  6 each Topical Q0600 Lateef, Munsoor, MD      . docusate sodium (COLACE) capsule 100 mg  100 mg Oral BID PRN Awilda Bill, NP      . heparin injection 5,000 Units  5,000 Units Subcutaneous Q8H Awilda Bill,  NP   5,000 Units at 12/13/19 0502  . ipratropium-albuterol (DUONEB) 0.5-2.5 (3) MG/3ML nebulizer solution 3 mL  3 mL Nebulization Q4H PRN Awilda Bill, NP      . ipratropium-albuterol (DUONEB) 0.5-2.5 (3) MG/3ML nebulizer solution 3 mL  3 mL Nebulization Q4H Taylor Lipps, MD   3 mL at 12/13/19 0729  . MEDLINE mouth rinse  15 mL Mouth Rinse q12n4p Taylor Lipps, MD   15 mL at 12/13/19 1101  . metroNIDAZOLE (FLAGYL) IVPB 500 mg  500 mg Intravenous Q8H Taylor Lipps, MD   Stopped at 12/13/19 0600  . morphine 2 MG/ML injection 2 mg  2 mg Intravenous Q1H PRN Taylor Lipps, MD   2 mg at 12/13/19 0427  . norepinephrine (LEVOPHED) 4mg  in 230mL premix infusion  0-40 mcg/min Intravenous Titrated Taylor Lipps, MD 11.25 mL/hr at 12/13/19 1100 3 mcg/min at 12/13/19 1100  . ondansetron (ZOFRAN) injection 4 mg  4 mg Intravenous Q6H PRN Awilda Bill, NP      . polyethylene glycol (MIRALAX / GLYCOLAX) packet 17 g  17 g Oral Daily PRN Awilda Bill, NP      . sodium chloride flush (NS) 0.9 % injection 10-40 mL  10-40 mL Intracatheter Q12H Awilda Bill, NP   10 mL at 12/13/19 1101  . sodium chloride flush (NS) 0.9 % injection 10-40 mL  10-40 mL Intracatheter PRN Awilda Bill, NP      . vancomycin (VANCOREADY) IVPB 750 mg/150 mL  750 mg Intravenous Q T,Th,Sa-HD Taylor Lipps, MD        VITAL SIGNS: BP (!) 98/52 (BP Location: Left Arm)   Pulse 72   Temp 97.6 F (36.4 C) (Axillary)   Resp (!) 9   Ht 5\' 6"  (1.676 m)   Wt 75.8 kg   SpO2 100%   BMI 26.97 kg/m  Filed Weights   12/11/19 1944 12/12/19 0025  Weight: 75 kg 75.8 kg    Estimated body mass index is 26.97 kg/m as calculated from the following:   Height as of this encounter: 5\' 6"  (1.676 m).   Weight as of this encounter: 75.8 kg.  LABS: CBC:    Component Value Date/Time   WBC 13.8 (H) 12/13/2019 0853   HGB 8.0 (L) 12/13/2019 0853   HGB 9.6 (L) 04/22/2014 0210   HCT 26.2 (L) 12/13/2019 8563  HCT 29.7 (L) 04/22/2014 0210   PLT  109 (L) 12/13/2019 0853   PLT 230 04/22/2014 0210   Comprehensive Metabolic Panel:    Component Value Date/Time   NA 137 12/13/2019 0853   NA 140 04/10/2017 0000   NA 140 04/22/2014 0210   K 3.1 (L) 12/13/2019 0853   K 4.0 04/22/2014 0210   CO2 22 12/13/2019 0853   CO2 24 04/22/2014 0210   BUN 29 (H) 12/13/2019 0853   BUN 27 (A) 04/10/2017 0000   BUN 27 (H) 04/22/2014 0210   CREATININE 2.67 (H) 12/13/2019 0853   CREATININE 2.64 (H) 01/12/2019 1352   ALBUMIN 1.5 (L) 12/11/2019 1945   ALBUMIN 2.1 (L) 01/27/2014 0503     Review of Systems   Physical Exam Deferred for comfort   Prognosis: Poor in the setting of advanced dementia, poor po intake, unstageable, nonhealing sacral wound, ESRD, hypotension, sepsis  Discharge Planning:  To Be Determined  Recommendations:  Full Code-as requested by son, family is obtaining her living will to review for patient's wishes. If not indicated sons will proceed to make decision.   Continue current plan of care per medical team  Sons aware of patient's poor prognosis. Detailed discussion regarding patient's overall decline, wound, aspiration, advanced dementia. Sons would not want mother to suffer. Understands and accepting her decline and that she is approaching end-of-life.   PMT will continue to support and follow.    Palliative Performance Scale: PPS 20%                Son expressed understanding and was in agreement with this plan.   Thank you for allowing the Palliative Medicine Team to assist in the care of this patient.  Time In: 1100 Time Out: 1150 Time Total: 50 min.   Visit consisted of counseling and education dealing with the complex and emotionally intense issues of symptom management and palliative care in the setting of serious and potentially life-threatening illness.Greater than 50%  of this time was spent counseling and coordinating care related to the above assessment and plan.  Signed by:  Alda Lea, AGPCNP-BC Palliative Medicine Team  Phone: [phone] Fax: [phone] Pager: [phone] Amion: Bjorn Pippin

## 2019-12-13 NOTE — Telephone Encounter (Signed)
Patient is currently admitted to hospital. AWV appointment to be rescheduled at a later date. Appointment cancelled.

## 2019-12-13 NOTE — Progress Notes (Signed)
CRITICAL CARE NOTE 79 yo female with ESRD on HD admitted with acute toxic metabolic encephalopathy secondary to severe sepsis in the setting of UTI, chronic nonhealing sacral decubitus ulcer CT suggestive of osteomyelitis along with L4 vertebral body postoperative collection/abscess requiring levophed gtt   SIGNIFICANT EVENTS/STUDIES:  05/9: Pt presented to [facility] ER minimally responsive and hypoxic.   05/9: Pt admitted to ICU with septic shock requiring levophed gtt with improvement in mentation and hypoxia resolved 05/9: CT Head revealed no acute intracranial abnormality. Mild to moderate severity cerebral atrophy and microvascular disease changes of the supratentorial brain. Mild sphenoid sinus disease. 05/9: CT Abd/Pelvis revealed extensive postoperative changes within the lumbar spine, as described above, with a 6.2 cm x 4.5 cm area of air and fluid attenuation seen posterior to the spinal canal at the level of the L4 vertebral body. This may represent a postoperative collection/abscess. Large, ventral hernias, as described above. Sacral decubitus ulcer with additional findings suggestive of acute osteomyelitis. Stable 6 mm nonobstructing renal stone within the left kidney. Sigmoid diverticulosis.  5/10 remains on pressors, slight increased WOB, lethargic   CC  Follow up septic shock  SUBJECTIVE Remains critically ill On pressors Lethargic  Poor prognosis   BP (!) 100/49 (BP Location: Left Arm)   Pulse 60   Temp (!) 96.5 F (35.8 C) (Axillary)   Resp (!) 8   Ht 5\' 6"  (1.676 m)   Wt 75.8 kg   SpO2 100%   BMI 26.97 kg/m    I/O last 3 completed shifts: In: 1994.6 [I.V.:876.4; IV Piggyback:1118.2] Out: 100 [Urine:100] Total I/O In: 127.8 [I.V.:27.8; IV Piggyback:100] Out: -   SpO2: 100 % O2 Flow Rate (L/min): 1 L/min  Estimated body mass index is 26.97 kg/m as calculated from the following:   Height as of this encounter: 5\' 6"  (1.676 m).   Weight as of this encounter:  75.8 kg.  REVIEW OF SYSTEMS  PATIENT IS UNABLE TO PROVIDE COMPLETE REVIEW OF SYSTEM S DUE TO SEVERE CRITICAL ILLNESS AND ENCEPHALOPATHY   Pressure Injury 11/19/19 Sacrum Mid;Right Unstageable - Full thickness tissue loss in which the base of the injury is covered by slough (yellow, tan, gray, green or brown) and/or eschar (tan, brown or black) in the wound bed. deep wound with bed covered  (Active)  11/19/19 1800  Location: Sacrum  Location Orientation: Mid;Right  Staging: Unstageable - Full thickness tissue loss in which the base of the injury is covered by slough (yellow, tan, gray, green or brown) and/or eschar (tan, brown or black) in the wound bed.  Wound Description (Comments): deep wound with bed covered by slough  Present on Admission: Yes     Pressure Injury 11/19/19 Heel Left Deep Tissue Pressure Injury - Purple or maroon localized area of discolored intact skin or blood-filled blister due to damage of underlying soft tissue from pressure and/or shear. (Active)  11/19/19 1800  Location: Heel  Location Orientation: Left  Staging: Deep Tissue Pressure Injury - Purple or maroon localized area of discolored intact skin or blood-filled blister due to damage of underlying soft tissue from pressure and/or shear.  Wound Description (Comments):   Present on Admission: Yes    PHYSICAL EXAMINATION:  GENERAL:critically ill appearing,  HEAD: Normocephalic, atraumatic.  EYES: Pupils equal, round, reactive to light.  No scleral icterus.  MOUTH: Moist mucosal membrane. NECK: Supple. No thyromegaly. No nodules. No JVD.  PULMONARY: +rhonchi,  CARDIOVASCULAR: S1 and S2. Regular rate and rhythm. No murmurs, rubs, or gallops.  GASTROINTESTINAL: Soft, nontender, -distended. Positive bowel sounds.  MUSCULOSKELETAL:  stage 4 sacral decub NEUROLOGIC: lethargic SKIN:intact,warm,dry  MEDICATIONS: I have reviewed all medications and confirmed regimen as documented   CULTURE RESULTS   Recent  Results (from the past 240 hour(s))  Blood Culture (routine x 2)     Status: None (Preliminary result)   Collection Time: 12/11/19  7:45 PM   Specimen: BLOOD  Result Value Ref Range Status   Specimen Description BLOOD RIGHT FA  Final   Special Requests   Final    BOTTLES DRAWN AEROBIC AND ANAEROBIC Blood Culture adequate volume   Culture  Setup Time   Final    Organism ID to follow GRAM NEGATIVE RODS IN BOTH AEROBIC AND ANAEROBIC BOTTLES CRITICAL RESULT CALLED TO, READ BACK BY AND VERIFIED WITHRayna Sexton AT 6010 ON 12/12/19 SNG Performed at [facility], [address], [place], [place] [address]    Culture GRAM NEGATIVE RODS  Final   Report Status PENDING  Incomplete  Blood Culture (routine x 2)     Status: None (Preliminary result)   Collection Time: 12/11/19  7:45 PM   Specimen: BLOOD  Result Value Ref Range Status   Specimen Description BLOOD RIGHT ARM  Final   Special Requests   Final    BOTTLES DRAWN AEROBIC AND ANAEROBIC Blood Culture adequate volume   Culture  Setup Time   Final    GRAM NEGATIVE RODS IN BOTH AEROBIC AND ANAEROBIC BOTTLES CRITICAL VALUE NOTED.  VALUE IS CONSISTENT WITH PREVIOUSLY REPORTED AND CALLED VALUE. Performed at [facility], [address] [place], [place] [address]    Culture GRAM NEGATIVE RODS  Final   Report Status PENDING  Incomplete  Blood Culture ID Panel (Reflexed)     Status: Abnormal   Collection Time: 12/11/19  7:45 PM  Result Value Ref Range Status   Enterococcus species NOT DETECTED NOT DETECTED Final   Listeria monocytogenes NOT DETECTED NOT DETECTED Final   Staphylococcus species NOT DETECTED NOT DETECTED Final   Staphylococcus aureus (BCID) NOT DETECTED NOT DETECTED Final   Streptococcus species NOT DETECTED NOT DETECTED Final   Streptococcus agalactiae NOT DETECTED NOT DETECTED Final   Streptococcus pneumoniae NOT DETECTED NOT DETECTED Final   Streptococcus pyogenes NOT DETECTED NOT DETECTED Final    Acinetobacter baumannii NOT DETECTED NOT DETECTED Final   Enterobacteriaceae species DETECTED (A) NOT DETECTED Final    Comment: Enterobacteriaceae represent a large family of gram-negative bacteria, not a single organism. CRITICAL RESULT CALLED TO, READ BACK BY AND VERIFIED WITH: Rayna Sexton AT 0254 ON 12/12/19 SNG    Enterobacter cloacae complex NOT DETECTED NOT DETECTED Final   Escherichia coli NOT DETECTED NOT DETECTED Final   Klebsiella oxytoca NOT DETECTED NOT DETECTED Final   Klebsiella pneumoniae NOT DETECTED NOT DETECTED Final   Proteus species DETECTED (A) NOT DETECTED Final    Comment: CRITICAL RESULT CALLED TO, READ BACK BY AND VERIFIED WITH: Rayna Sexton AT 2706 ON 12/12/19 SNG    Serratia marcescens NOT DETECTED NOT DETECTED Final   Carbapenem resistance NOT DETECTED NOT DETECTED Final   Haemophilus influenzae NOT DETECTED NOT DETECTED Final   Neisseria meningitidis NOT DETECTED NOT DETECTED Final   Pseudomonas aeruginosa NOT DETECTED NOT DETECTED Final   Candida albicans NOT DETECTED NOT DETECTED Final   Candida glabrata NOT DETECTED NOT DETECTED Final   Candida krusei NOT DETECTED NOT DETECTED Final   Candida parapsilosis NOT DETECTED NOT DETECTED Final   Candida tropicalis  NOT DETECTED NOT DETECTED Final    Comment: Performed at [facility], [address] [place], [place] [address]  Urine culture     Status: Abnormal   Collection Time: 12/11/19  8:09 PM   Specimen: In/Out Cath Urine  Result Value Ref Range Status   Specimen Description   Final    IN/OUT CATH URINE Performed at [facility], [address], [place], [place] [address]    Special Requests   Final    NONE Performed at [facility], [address] [place], [place] [address]    Culture MULTIPLE SPECIES PRESENT, SUGGEST RECOLLECTION (A)  Final   Report Status 12/12/2019 FINAL  Final  Respiratory Panel by RT PCR (Flu A&B, Covid) - Nasopharyngeal Swab     Status: None    Collection Time: 12/11/19  9:17 PM   Specimen: Nasopharyngeal Swab  Result Value Ref Range Status   SARS Coronavirus 2 by RT PCR NEGATIVE NEGATIVE Final    Comment: (NOTE) SARS-CoV-2 target nucleic acids are NOT DETECTED. The SARS-CoV-2 RNA is generally detectable in upper respiratoy specimens during the acute phase of infection. The lowest concentration of SARS-CoV-2 viral copies this assay can detect is 131 copies/mL. A negative result does not preclude SARS-Cov-2 infection and should not be used as the sole basis for treatment or other patient management decisions. A negative result may occur with  improper specimen collection/handling, submission of specimen other than nasopharyngeal swab, presence of viral mutation(s) within the areas targeted by this assay, and inadequate number of viral copies (<131 copies/mL). A negative result must be combined with clinical observations, patient history, and epidemiological information. The expected result is Negative. Fact Sheet for Patients:  PinkCheek.be Fact Sheet for Healthcare Providers:  GravelBags.it This test is not yet ap proved or cleared by the [place] FDA and  has been authorized for detection and/or diagnosis of SARS-CoV-2 by FDA under an Emergency Use Authorization (EUA). This EUA will remain  in effect (meaning this test can be used) for the duration of the COVID-19 declaration under Section 564(b)(1) of the Act, 21 U.S.C. section 360bbb-3(b)(1), unless the authorization is terminated or revoked sooner.    Influenza A by PCR NEGATIVE NEGATIVE Final   Influenza B by PCR NEGATIVE NEGATIVE Final    Comment: (NOTE) The Xpert Xpress SARS-CoV-2/FLU/RSV assay is intended as an aid in  the diagnosis of influenza from Nasopharyngeal swab specimens and  should not be used as a sole basis for treatment. Nasal washings and  aspirates are unacceptable for Xpert Xpress  SARS-CoV-2/FLU/RSV  testing. Fact Sheet for Patients: PinkCheek.be Fact Sheet for Healthcare Providers: GravelBags.it This test is not yet approved or cleared by the [place] FDA and  has been authorized for detection and/or diagnosis of SARS-CoV-2 by  FDA under an Emergency Use Authorization (EUA). This EUA will remain  in effect (meaning this test can be used) for the duration of the  Covid-19 declaration under Section 564(b)(1) of the Act, 21  U.S.C. section 360bbb-3(b)(1), unless the authorization is  terminated or revoked. Performed at [facility] [address] [place], [place] [address]   MRSA PCR Screening     Status: None   Collection Time: 12/12/19 12:29 AM   Specimen: Nasopharyngeal  Result Value Ref Range Status   MRSA by PCR NEGATIVE NEGATIVE Final    Comment:        The GeneXpert MRSA Assay (FDA approved for NASAL specimens only), is one component of  a comprehensive MRSA colonization surveillance program. It is not intended to diagnose MRSA infection nor to guide or monitor treatment for MRSA infections. Performed at [facility], [address] [place], [place] [address]   Aerobic/Anaerobic Culture (surgical/deep wound)     Status: None (Preliminary result)   Collection Time: 12/12/19  2:06 AM   Specimen: Sacral; Wound  Result Value Ref Range Status   Specimen Description   Final    SACRAL Performed at [facility], [address], [place], [place] [address]    Special Requests   Final    NONE Performed at [facility], [address] [place], [place] [address]    Gram Stain   Final    NO WBC SEEN NO ORGANISMS SEEN Performed at [facility], [address] [place], [place] [address]    Culture PENDING  Incomplete   Report Status PENDING  Incomplete  Aerobic/Anaerobic Culture (surgical/deep wound)     Status: None (Preliminary result)    Collection Time: 12/12/19  2:06 AM   Specimen: Back; Wound  Result Value Ref Range Status   Specimen Description   Final    BACK LUMBAR SPINE Performed at [facility], [address], [place], [place] [address]    Special Requests   Final    NONE Performed at [facility], [address] [place], [place] [address]    Gram Stain   Final    NO WBC SEEN NO ORGANISMS SEEN Performed at [facility], [place] [address] [place], [place] [address]    Culture PENDING  Incomplete   Report Status PENDING  Incomplete       Indwelling Urinary Catheter continued, requirement due to   Reason to continue Indwelling Urinary Catheter strict Intake/Output monitoring for hemodynamic instability   Central Line/ continued, requirement due to  Reason to continue [place] of central venous pressure or other hemodynamic parameters and poor IV access       ASSESSMENT AND PLAN SYNOPSIS   Septic shock due to UTI and acute osteomyelitis -use vasopressors to keep MAP>65 -emperic ABX -consider stress dose steroids  END STAGE KIDNEY INJURY/Renal Failure -follow chem 7 HD as needed    NEUROLOGY Acute encephalopathy from septic shock   CARDIAC ICU monitoring  INFECTIOUS DISEASE -continue antibiotics as prescribed -follow up cultures -follow up ID consultation   ELECTROLYTES -follow labs as needed -replace as needed -pharmacy consultation and following    DVT/GI PRX ordered TRANSFUSIONS AS NEEDED MONITOR FSBS ASSESS the need for LABS as needed    Critical Care Time devoted to patient care services described in this note is 32 minutes.   Overall, patient is critically ill, prognosis is guarded.  Patient with Multiorgan failure and at high risk for cardiac arrest and death.   RECOMMEND DNR/DNI STATUS  Corrin Parker, M.D.  [facility]  Medical Director [place] Director [facility]  Cardio-Pulmonary Department

## 2019-12-13 NOTE — Consult Note (Signed)
NAME: Taylor Tyler  DOB: 1940-11-05  MRN: [id number]  Date/Time: 12/13/2019 12:46 PM  REQUESTING PROVIDER: Dr. Mortimer Fries Subjective:  REASON FOR CONSULT: Stage IV sacral decubitus and osteomyelitis No history available from patient.  Chart reviewed.  ? Taylor Tyler is a 79 y.o. female with a history of end-stage renal disease, hypertension, diabetes, history of lumbar fusion, laminectomy 2014, with revision in 4098 complicated by wound infection due to Proteus   admitted from  facility with fever and altered mental status of 1 day duration. In the ED temperature was 101.2, BP 101/ 44, heart rate of 79 and respiratory rate of 34.  As blood pressure dropped further she was admitted to the ICU and started on pressors.  Her labs revealed a WBC of 11.5 which then became 15.8, hemoglobin of 7.9, platelet of 134.  Blood culture sent and CT abdomen and pelvis revealed extensive postoperative changes within the lumbar spine as described with a 6.2 into 4.5 cm area of air and fluid attenuation posterior to the spinal canal at the level of L4 vertebral body.  Sacral decubitus ulcer with additional finding suggestive of acute osteomyelitis present.  She is started on vancomycin, cefepime and Flagyl. Surgery saw her and debrided the sacral decubitus. I am seeing the patient for Proteus bacteremia.  Patient has a complicated infectious disease   history. In July 2014 she had bilateral L3-L4 and L4-L5 decompressive laminectomy with L3-L4 and L4-L5 Coflex posterior instrumentation.  In July 2018 she underwent revision.  She had removal of the L3-L4 and L4-L5 Coflex devices, reoperative bilateral L3-L4 and L4-L5 laminectomies with L4-L5 Gill procedure and L5-S1 initial laminectomy with facetectomy posterior segmental instrumented fusion L3-S1  In Aug there was wound dehiscence and on8/27/18she underwentLumbar wound exploration/debridement, placement of wound vac (N/A) - Lumbar wound exploration/debridement,  placement of wound vacby Dr.Ditty Proteus in wound culture.initally treated with IV ceftriaxone and then on discharge changed to PO levaquin for 6 weeks- Was followed by [facility]  Finally the wound vac was removed feb 2019.   Was hospitalized in [facility] Jun 04, 2018 with discharging sinus- transferred to [facility] on 06/05/18 and underwent Exploration, debridement, and closure of lumbar wound by neurosurgeon on 06/06/18. Culture  Again pansensitive proteus- was treated with IV followed by 2 weeks of Po amoxicillin by [facility]. After discharge she was intermittently followed by ID at [facility] and the wound continued to persist.  She was hospitalized again to [facility] in [place] and stayed between 10/26-11/30/20  and on 06/23/2019 underwent complete removal of hardware    Pt was  Treated again with IV cefepime and then cefazolin n given during dialysis until 08/04/19 for >8 weeks  Pt was hospitalized 4/17-4/21 with fever following covid vaccine- Blood culture neg- had a high wbc-back wound was okay    Past Medical History:  Diagnosis Date  . Acute blood loss anemia 03/14/2017  . Acute renal failure superimposed on stage 3 chronic kidney disease ([place]) 03/14/2017  . AKI (acute kidney injury) ([place]) 02/28/2017  . Altered mental status 03/29/2017  . Anemia   . Aortic atherosclerosis ([place])   . Arthritis    "joints might ache at times; not that bad" (06/10/2018)  . Bilateral lower extremity edema 03/04/2017  . Bradycardia   . Chronic kidney disease    ?? renal insufficiency,   . CKD (chronic kidney disease) stage 3, GFR 30-59 ml/min 01/15/2017   ?? renal insufficiency, which she thinks is coming from "all these medications"  .  COVID-19    10/17/19  . Dementia ([place])   . Diabetes mellitus without complication ([place])    diagnosed 4-5 yrs ago, 06/21/18- "that was years ago"  . Disease of pancreas   . Diverticulitis    s/p perforation and partial colectomy 01/27/14 with 3 benign lymph nodes   . Diverticulosis  01/15/2017  . DJD (degenerative joint disease)   . DM (diabetes mellitus), type 2 with renal complications ([place]) 5/73/2202  . Elevated ferritin level   . Fatty liver   . Hypertension   . Hypothyroidism    "had radiation" (06/10/2018)  . Kidney disease, chronic, stage V (GFR under 15 ml/min) ([place])    per notes from nursing home-she is on dialysis  . Kidney stone   . Lethargy 02/28/2017  . Obesity, Class III, BMI 40-49.9 (morbid obesity) ([place]) 02/27/2017  . Pleural lipoma   . Postoperative wound infection 04/02/2017  . Spinal stenosis of lumbar region 01/15/2017  . Status post lumbar surgery   . Wound healing, delayed    back    Past Surgical History:  Procedure Laterality Date  . ABDOMINAL EXPOSURE N/A 02/24/2017   Procedure: ABDOMINAL EXPOSURE;  Surgeon: Angelia Mould, MD;  Location: [place];  Service: Vascular;  Laterality: N/A;  . ABDOMINAL HYSTERECTOMY  1987   no h/o abnormal paps   . ANTERIOR LAT LUMBAR FUSION N/A 02/24/2017   Procedure: Lumbar three- five Anterior lateral lumbar interbody fusion;  Surgeon: Ditty, Kevan Ny, MD;  Location: [place];  Service: Neurosurgery;  Laterality: N/A;  L3-5 Anterior lateral lumbar interbody fusion with removal of coflex at L3-4, L4-5  . ANTERIOR LUMBAR FUSION N/A 02/24/2017   Procedure: Stage 1: Lumbar five-Sacral one Anterior lumbar interbody fusion;  Surgeon: Ditty, Kevan Ny, MD;  Location: [facility];  Service: Neurosurgery;  Laterality: N/A;  Stage 1: L5-S1 Anterior lumbar interbody fusion  . APPENDECTOMY    . APPLICATION OF ROBOTIC ASSISTANCE FOR SPINAL PROCEDURE N/A 02/26/2017   Procedure: APPLICATION OF ROBOTIC ASSISTANCE FOR SPINAL PROCEDURE;  Surgeon: Ditty, Kevan Ny, MD;  Location: [facility];  Service: Neurosurgery;  Laterality: N/A;  . APPLICATION OF WOUND VAC N/A 03/30/2017   Procedure: APPLICATION OF WOUND VAC;  Surgeon: Ditty, Kevan Ny, MD;  Location: [place];  Service: Neurosurgery;  Laterality: N/A;  . APPLICATION OF  WOUND VAC N/A 06/23/2019   Procedure: APPLICATION OF WOUND VAC;  Surgeon: Consuella Lose, MD;  Location: [facility] [facility];  Service: Neurosurgery;  Laterality: N/A;  . AV FISTULA PLACEMENT Left 06/08/2019   Procedure: Arteriovenous (Av) Fistula Creation;  Surgeon: Rosetta Posner, MD;  Location: [facility] [place];  Service: Vascular;  Laterality: Left;  . BACK SURGERY  2013  . CATARACT EXTRACTION W/ INTRAOCULAR LENS IMPLANT Right   . CHOLECYSTECTOMY OPEN  1982  . COLON SURGERY  01/26/2014   desc.sigmoid colectomy and ventral hernia repair and splenic flexure mobilization  . COLON SURGERY    . DILATION AND CURETTAGE OF UTERUS    . HERNIA REPAIR    . INSERTION OF DIALYSIS CATHETER Right 06/08/2019   Procedure: INSERTION OF DIALYSIS CATHETER RIGHT INTERNAL JUGULAR;  Surgeon: Rosetta Posner, MD;  Location: [place];  Service: Vascular;  Laterality: Right;  . LUMBAR LAMINECTOMY WITH SPINOUS PROCESS PLATE 2 LEVEL N/A 5/42/7062   Procedure: LUMBAR LAMINECTOMY/DECOMPRESSION MICRODISCECTOMY CoFlex;  Surgeon: Faythe Ghee, MD;  [facility];  Service: Neurosurgery;  Laterality: N/A;  Lumbar three-four,Lumbar Four-Five Laminectomy with Coflex  . LUMBAR WOUND DEBRIDEMENT N/A 03/30/2017  Procedure: Lumbar wound exploration/debridement, placement of wound vac;  Surgeon: Ditty, Kevan Ny, MD;  Location: [facility];  Service: Neurosurgery;  Laterality: N/A;  Lumbar wound exploration/debridement, placement of wound vac  . LUMBAR WOUND DEBRIDEMENT N/A 06/06/2018   Procedure: LUMBAR WOUND DEBRIDEMENT/EXPLORATION;  Surgeon: Consuella Lose, MD;  Location: [place] [facility];  Service: Neurosurgery;  Laterality: N/A;  . LUMBAR WOUND DEBRIDEMENT N/A 06/22/2018   Procedure: SIMPLE INCISION AND DRAINAGE OF WOUND, APPLICATION OF WOUND VAC;  Surgeon: Consuella Lose, MD;  Location: [facility];  Service: Neurosurgery;  Laterality: N/A;  . LUMBAR WOUND DEBRIDEMENT N/A 06/23/2019   Procedure: LUMBAR WOUND DEBRIDEMENT WITH HARDWARE REMOVAL;   Surgeon: Consuella Lose, MD;  Location: [facility];  Service: Neurosurgery;  Laterality: N/A;  . PARTIAL COLECTOMY     01/27/14 diverticulitis and 3 benign lymph nodes [facility] Dr. Pat Patrick   . REDUCTION MAMMAPLASTY Bilateral 1992  . THROMBECTOMY BRACHIAL ARTERY Left 06/08/2019   Procedure: Thrombectomy Brachial Artery;  Surgeon: Rosetta Posner, MD;  Location: [place];  Service: Vascular;  Laterality: Left;  . TUBAL LIGATION    . VENTRAL HERNIA REPAIR  2014    Social History   Socioeconomic History  . Marital status: Widowed    Spouse name: Not on file  . Number of children: Not on file  . Years of education: Not on file  . Highest education level: Not on file  Occupational History  . Occupation: retired Economist work  Tobacco Use  . Smoking status: Never Smoker  . Smokeless tobacco: Never Used  Substance and Sexual Activity  . Alcohol use: Not Currently    Comment: "nothing since age 74" (06/10/2018)  . Drug use: Never  . Sexual activity: Not Currently  Other Topics Concern  . Not on file  Social History Narrative   Admitted to [facility] 03/03/17   Widowed since 2012    2 sons [name] comes to appts)    Never smoked   Alcohol none   Full Code   12th grade education retired    Occupational hygienist    Social Determinants of Radio broadcast assistant Strain:   . Difficulty of Paying Living Expenses:   Food Insecurity:   . Worried About Charity fundraiser in the Last Year:   . Arboriculturist in the Last Year:   Transportation Needs:   . Film/video editor (Medical):   Marland Kitchen Lack of Transportation (Non-Medical):   Physical Activity:   . Days of Exercise per Week:   . Minutes of Exercise per Session:   Stress:   . Feeling of Stress :   Social Connections:   . Frequency of Communication with Friends and Family:   . Frequency of Social Gatherings with Friends and Family:   . Attends Religious Services:   . Active Member of Clubs or Organizations:   . Attends Theatre manager Meetings:   Marland Kitchen Marital Status:   Intimate Partner Violence:   . Fear of Current or Ex-Partner:   . Emotionally Abused:   Marland Kitchen Physically Abused:   . Sexually Abused:     Family History  Problem Relation Age of Onset  . Diabetes Father   . Heart attack Father   . Hypertension Father   . Heart disease Father   . Kidney disease Father   . Diabetes Mother   . Hypertension Mother   . Heart disease Mother   . Kidney disease Mother   . Lupus Sister   .  Heart disease Brother   . Diabetes Brother   . Heart disease Brother   . Diabetes Brother   . Lupus Sister    Allergies  Allergen Reactions  . Clams [Shellfish Allergy] Swelling and Other (See Comments)    THROAT SWELLS NECK TURNS RED  . Hydralazine Other (See Comments)    CHEST TIGHTNESS Patient has tolerated multiple doses of hydralazine since allergy was listed   . Norvasc [Amlodipine Besylate] Swelling    Leg edema   . Milk-Related Compounds Diarrhea    ? Current Facility-Administered Medications  Medication Dose Route Frequency Provider Last Rate Last Admin  . 0.9 %  sodium chloride infusion  250 mL Intravenous Continuous Delman Kitten, MD   Stopped at 12/12/19 2140  . acetaminophen (TYLENOL) tablet 650 mg  650 mg Oral Q4H PRN Awilda Bill, NP      . albumin human 25 % solution 25 g  25 g Intravenous Q T,Th,Sa-HD Lateef, Munsoor, MD      . budesonide (PULMICORT) nebulizer solution 0.25 mg  0.25 mg Nebulization BID Flora Lipps, MD   0.25 mg at 12/13/19 0729  . ceFEPIme (MAXIPIME) 1 g in sodium chloride 0.9 % 100 mL IVPB  1 g Intravenous Q24H Flora Lipps, MD   Stopped at 12/12/19 2040  . chlorhexidine (PERIDEX) 0.12 % solution 15 mL  15 mL Mouth Rinse BID Flora Lipps, MD   15 mL at 12/13/19 0942  . Chlorhexidine Gluconate Cloth 2 % PADS 6 each  6 each Topical Q0600 Awilda Bill, NP   6 each at 12/13/19 0538  . Chlorhexidine Gluconate Cloth 2 % PADS 6 each  6 each Topical Q0600 Lateef, Munsoor, MD      .  docusate sodium (COLACE) capsule 100 mg  100 mg Oral BID PRN Awilda Bill, NP      . Derrill Memo ON 12/15/2019] epoetin alfa (EPOGEN) injection 10,000 Units  10,000 Units Intravenous Q T,Th,Sa-HD Lateef, Munsoor, MD      . heparin injection 5,000 Units  5,000 Units Subcutaneous Q8H Awilda Bill, NP   5,000 Units at 12/13/19 0502  . ipratropium-albuterol (DUONEB) 0.5-2.5 (3) MG/3ML nebulizer solution 3 mL  3 mL Nebulization Q4H PRN Awilda Bill, NP      . ipratropium-albuterol (DUONEB) 0.5-2.5 (3) MG/3ML nebulizer solution 3 mL  3 mL Nebulization Q4H Flora Lipps, MD   3 mL at 12/13/19 1130  . MEDLINE mouth rinse  15 mL Mouth Rinse q12n4p Flora Lipps, MD   15 mL at 12/13/19 1101  . metroNIDAZOLE (FLAGYL) IVPB 500 mg  500 mg Intravenous Q8H Flora Lipps, MD   Stopped at 12/13/19 0600  . morphine 2 MG/ML injection 2 mg  2 mg Intravenous Q1H PRN Flora Lipps, MD   2 mg at 12/13/19 0427  . norepinephrine (LEVOPHED) 4mg  in 268mL premix infusion  0-40 mcg/min Intravenous Titrated Flora Lipps, MD 11.25 mL/hr at 12/13/19 1100 3 mcg/min at 12/13/19 1100  . ondansetron (ZOFRAN) injection 4 mg  4 mg Intravenous Q6H PRN Awilda Bill, NP      . polyethylene glycol (MIRALAX / GLYCOLAX) packet 17 g  17 g Oral Daily PRN Awilda Bill, NP      . sodium chloride flush (NS) 0.9 % injection 10-40 mL  10-40 mL Intracatheter Q12H Awilda Bill, NP   10 mL at 12/13/19 1101  . sodium chloride flush (NS) 0.9 % injection 10-40 mL  10-40 mL Intracatheter PRN Awilda Bill, NP      .  vancomycin (VANCOREADY) IVPB 750 mg/150 mL  750 mg Intravenous Q T,Th,Sa-HD Flora Lipps, MD         Abtx:  Anti-infectives (From admission, onward)   Start     Dose/Rate Route Frequency Ordered Stop   12/13/19 2000  vancomycin (VANCOREADY) IVPB 1500 mg/300 mL  Status:  Discontinued     1,500 mg 150 mL/hr over 120 Minutes Intravenous Every 48 hours 12/11/19 2058 12/11/19 2213   12/13/19 1200  vancomycin (VANCOREADY) IVPB  750 mg/150 mL     750 mg 150 mL/hr over 60 Minutes Intravenous Every T-Th-Sa (Hemodialysis) 12/12/19 1140     12/13/19 0800  vancomycin (VANCOREADY) IVPB 1250 mg/250 mL  Status:  Discontinued     1,250 mg 166.7 mL/hr over 90 Minutes Intravenous Every 36 hours 12/11/19 2213 12/12/19 0749   12/12/19 2100  vancomycin (VANCOREADY) IVPB 750 mg/150 mL  Status:  Discontinued     750 mg 150 mL/hr over 60 Minutes Intravenous Every 24 hours 12/12/19 0749 12/12/19 0752   12/12/19 2000  ceFEPIme (MAXIPIME) 2 g in sodium chloride 0.9 % 100 mL IVPB  Status:  Discontinued     2 g 200 mL/hr over 30 Minutes Intravenous Every 24 hours 12/11/19 2054 12/12/19 1431   12/12/19 2000  ceFEPIme (MAXIPIME) 1 g in sodium chloride 0.9 % 100 mL IVPB     1 g 200 mL/hr over 30 Minutes Intravenous Every 24 hours 12/12/19 1431     12/12/19 1400  metroNIDAZOLE (FLAGYL) IVPB 500 mg     500 mg 100 mL/hr over 60 Minutes Intravenous Every 8 hours 12/12/19 1138     12/12/19 0752  vancomycin variable dose per unstable renal function (pharmacist dosing)  Status:  Discontinued      Does not apply See admin instructions 12/12/19 0752 12/12/19 1140   12/11/19 2045  vancomycin (VANCOREADY) IVPB 500 mg/100 mL     500 mg 100 mL/hr over 60 Minutes Intravenous  Once 12/11/19 2037 12/12/19 0138   12/11/19 2000  ceFEPIme (MAXIPIME) 2 g in sodium chloride 0.9 % 100 mL IVPB     2 g 200 mL/hr over 30 Minutes Intravenous  Once 12/11/19 1956 12/11/19 2040   12/11/19 2000  metroNIDAZOLE (FLAGYL) IVPB 500 mg     500 mg 100 mL/hr over 60 Minutes Intravenous  Once 12/11/19 1956 12/11/19 2251   12/11/19 2000  vancomycin (VANCOCIN) IVPB 1000 mg/200 mL premix     1,000 mg 200 mL/hr over 60 Minutes Intravenous  Once 12/11/19 1956 12/11/19 2116      REVIEW OF SYSTEMS:  NA?  Objective:  VITALS:  BP (!) 108/37   Pulse 74   Temp 97.6 F (36.4 C) (Axillary)   Resp 10   Ht 5\' 6"  (1.676 m)   Wt 75.8 kg   SpO2 100%   BMI 26.97 kg/m    PHYSICAL EXAM:  General: Lethargic, somnolent, getting dialysis Head: Normocephalic, without obvious abnormality, atraumatic. Eyes: Conjunctivae clear, anicteric sclerae. Pupils are equal ENT Nares normal. No drainage or sinus tenderness. Oral cavity cannot be examined Neck: Left IJ, right chest wall permacath no carotid bruit and no JVD. Back: Pictures reviewed      Lungs: Bilateral air entry. Heart: Regular rate and rhythm,  Abdomen: Soft, para umbilical hernia  extremities: atraumatic, no cyanosis. No edema. No clubbing Skin: No rashes or lesions. Or bruising Lymph: Cervical, supraclavicular normal. Neurologic: Cannot be assessed Pertinent Labs Lab Results CBC    Component Value Date/Time  WBC 13.8 (H) 12/13/2019 0853   RBC 2.93 (L) 12/13/2019 0853   HGB 8.0 (L) 12/13/2019 0853   HGB 9.6 (L) 04/22/2014 0210   HCT 26.2 (L) 12/13/2019 0853   HCT 29.7 (L) 04/22/2014 0210   PLT 109 (L) 12/13/2019 0853   PLT 230 04/22/2014 0210   MCV 89.4 12/13/2019 0853   MCV 90 04/22/2014 0210   MCH 27.3 12/13/2019 0853   MCHC 30.5 12/13/2019 0853   RDW 21.8 (H) 12/13/2019 0853   RDW 17.8 (H) 04/22/2014 0210   LYMPHSABS 1.0 12/13/2019 0853   LYMPHSABS 2.0 04/22/2014 0210   MONOABS 0.5 12/13/2019 0853   MONOABS 0.6 04/22/2014 0210   EOSABS 0.1 12/13/2019 0853   EOSABS 0.0 04/22/2014 0210   BASOSABS 0.1 12/13/2019 0853   BASOSABS 0.0 04/22/2014 0210    CMP Latest Ref Rng & Units 12/13/2019 12/12/2019 12/12/2019  Glucose 70 - 99 mg/dL 88 - 180(H)  BUN 8 - 23 mg/dL 29(H) - 20  Creatinine 0.44 - 1.00 mg/dL 2.67(H) - 2.14(H)  Sodium 135 - 145 mmol/L 137 - 135  Potassium 3.5 - 5.1 mmol/L 3.1(L) 3.5 2.7(LL)  Chloride 98 - 111 mmol/L 107 - 106  CO2 22 - 32 mmol/L 22 - 23  Calcium 8.9 - 10.3 mg/dL 7.5(L) - 7.0(L)  Total Protein 6.5 - 8.1 g/dL - - -  Total Bilirubin 0.3 - 1.2 mg/dL - - -  Alkaline Phos 38 - 126 U/L - - -  AST 15 - 41 U/L - - -  ALT 0 - 44 U/L - - -       Microbiology: Recent Results (from the past 240 hour(s))  Blood Culture (routine x 2)     Status: None (Preliminary result)   Collection Time: 12/11/19  7:45 PM   Specimen: BLOOD  Result Value Ref Range Status   Specimen Description   Final    BLOOD RIGHT FA Performed at [facility], [address] [place], [place] [address]    Special Requests   Final    BOTTLES DRAWN AEROBIC AND ANAEROBIC Blood Culture adequate volume Performed at [facility], [address] [place], [place] [address]    Culture  Setup Time   Final    GRAM NEGATIVE RODS IN BOTH AEROBIC AND ANAEROBIC BOTTLES CRITICAL RESULT CALLED TO, READ BACK BY AND VERIFIED WITHRayna Sexton AT 8295 ON 12/12/19 SNG Performed at [facility], [place] [address], [place], [place] [address]    Culture GRAM NEGATIVE RODS  Final   Report Status PENDING  Incomplete  Blood Culture (routine x 2)     Status: None (Preliminary result)   Collection Time: 12/11/19  7:45 PM   Specimen: BLOOD  Result Value Ref Range Status   Specimen Description BLOOD RIGHT ARM  Final   Special Requests   Final    BOTTLES DRAWN AEROBIC AND ANAEROBIC Blood Culture adequate volume   Culture  Setup Time   Final    GRAM NEGATIVE RODS IN BOTH AEROBIC AND ANAEROBIC BOTTLES CRITICAL VALUE NOTED.  VALUE IS CONSISTENT WITH PREVIOUSLY REPORTED AND CALLED VALUE. Performed at [facility], [address] [place], [place] [address]    Culture GRAM NEGATIVE RODS  Final   Report Status PENDING  Incomplete  Blood Culture ID Panel (Reflexed)     Status: Abnormal   Collection Time: 12/11/19  7:45 PM  Result Value Ref Range Status   Enterococcus species NOT DETECTED NOT DETECTED Final  Listeria monocytogenes NOT DETECTED NOT DETECTED Final   Staphylococcus species NOT DETECTED NOT DETECTED Final   Staphylococcus aureus (BCID) NOT DETECTED NOT DETECTED Final   Streptococcus species NOT DETECTED NOT DETECTED Final    Streptococcus agalactiae NOT DETECTED NOT DETECTED Final   Streptococcus pneumoniae NOT DETECTED NOT DETECTED Final   Streptococcus pyogenes NOT DETECTED NOT DETECTED Final   Acinetobacter baumannii NOT DETECTED NOT DETECTED Final   Enterobacteriaceae species DETECTED (A) NOT DETECTED Final    Comment: Enterobacteriaceae represent a large family of gram-negative bacteria, not a single organism. CRITICAL RESULT CALLED TO, READ BACK BY AND VERIFIED WITH: Rayna Sexton AT 5277 ON 12/12/19 SNG    Enterobacter cloacae complex NOT DETECTED NOT DETECTED Final   Escherichia coli NOT DETECTED NOT DETECTED Final   Klebsiella oxytoca NOT DETECTED NOT DETECTED Final   Klebsiella pneumoniae NOT DETECTED NOT DETECTED Final   Proteus species DETECTED (A) NOT DETECTED Final    Comment: CRITICAL RESULT CALLED TO, READ BACK BY AND VERIFIED WITH: Rayna Sexton AT 8242 ON 12/12/19 SNG    Serratia marcescens NOT DETECTED NOT DETECTED Final   Carbapenem resistance NOT DETECTED NOT DETECTED Final   Haemophilus influenzae NOT DETECTED NOT DETECTED Final   Neisseria meningitidis NOT DETECTED NOT DETECTED Final   Pseudomonas aeruginosa NOT DETECTED NOT DETECTED Final   Candida albicans NOT DETECTED NOT DETECTED Final   Candida glabrata NOT DETECTED NOT DETECTED Final   Candida krusei NOT DETECTED NOT DETECTED Final   Candida parapsilosis NOT DETECTED NOT DETECTED Final   Candida tropicalis NOT DETECTED NOT DETECTED Final    Comment: Performed at [facility], [address], [place], [place] [address]  Urine culture     Status: Abnormal   Collection Time: 12/11/19  8:09 PM   Specimen: In/Out Cath Urine  Result Value Ref Range Status   Specimen Description   Final    IN/OUT CATH URINE Performed at [facility], [address] [place], [place] [address]    Special Requests   Final    NONE Performed at [facility], [address] [place], [place] [address]    Culture MULTIPLE  SPECIES PRESENT, SUGGEST RECOLLECTION (A)  Final   Report Status 12/12/2019 FINAL  Final  Respiratory Panel by RT PCR (Flu A&B, Covid) - Nasopharyngeal Swab     Status: None   Collection Time: 12/11/19  9:17 PM   Specimen: Nasopharyngeal Swab  Result Value Ref Range Status   SARS Coronavirus 2 by RT PCR NEGATIVE NEGATIVE Final    Comment: (NOTE) SARS-CoV-2 target nucleic acids are NOT DETECTED. The SARS-CoV-2 RNA is generally detectable in upper respiratoy specimens during the acute phase of infection. The lowest concentration of SARS-CoV-2 viral copies this assay can detect is 131 copies/mL. A negative result does not preclude SARS-Cov-2 infection and should not be used as the sole basis for treatment or other patient management decisions. A negative result may occur with  improper specimen collection/handling, submission of specimen other than nasopharyngeal swab, presence of viral mutation(s) within the areas targeted by this assay, and inadequate number of viral copies (<131 copies/mL). A negative result must be combined with clinical observations, patient history, and epidemiological information. The expected result is Negative. Fact Sheet for Patients:  PinkCheek.be Fact Sheet for Healthcare Providers:  GravelBags.it This test is not yet ap proved or cleared by the [place] FDA and  has been authorized for detection and/or diagnosis of SARS-CoV-2 by FDA under an Emergency  Use Authorization (EUA). This EUA will remain  in effect (meaning this test can be used) for the duration of the COVID-19 declaration under Section 564(b)(1) of the Act, 21 U.S.C. section 360bbb-3(b)(1), unless the authorization is terminated or revoked sooner.    Influenza A by PCR NEGATIVE NEGATIVE Final   Influenza B by PCR NEGATIVE NEGATIVE Final    Comment: (NOTE) The Xpert Xpress SARS-CoV-2/FLU/RSV assay is intended as an aid in  the  diagnosis of influenza from Nasopharyngeal swab specimens and  should not be used as a sole basis for treatment. Nasal washings and  aspirates are unacceptable for Xpert Xpress SARS-CoV-2/FLU/RSV  testing. Fact Sheet for Patients: PinkCheek.be Fact Sheet for Healthcare Providers: GravelBags.it This test is not yet approved or cleared by the [place] FDA and  has been authorized for detection and/or diagnosis of SARS-CoV-2 by  FDA under an Emergency Use Authorization (EUA). This EUA will remain  in effect (meaning this test can be used) for the duration of the  Covid-19 declaration under Section 564(b)(1) of the Act, 21  U.S.C. section 360bbb-3(b)(1), unless the authorization is  terminated or revoked. Performed at [facility], [address] [place], [place] [address]   MRSA PCR Screening     Status: None   Collection Time: 12/12/19 12:29 AM   Specimen: Nasopharyngeal  Result Value Ref Range Status   MRSA by PCR NEGATIVE NEGATIVE Final    Comment:        The GeneXpert MRSA Assay (FDA approved for NASAL specimens only), is one component of a comprehensive MRSA colonization surveillance program. It is not intended to diagnose MRSA infection nor to guide or monitor treatment for MRSA infections. Performed at [facility], [address] [place], [place] [address]   Aerobic/Anaerobic Culture (surgical/deep wound)     Status: None (Preliminary result)   Collection Time: 12/12/19  2:06 AM   Specimen: Sacral; Wound  Result Value Ref Range Status   Specimen Description   Final    SACRAL Performed at [facility], [address], [place], [place] [address]    Special Requests   Final    NONE Performed at [facility], [address] [place], [place] [address]    Gram Stain   Final    NO WBC SEEN NO ORGANISMS SEEN Performed at [facility], [place] [address],  [place], [place] [address]    Culture FEW GRAM NEGATIVE RODS  Final   Report Status PENDING  Incomplete  Aerobic/Anaerobic Culture (surgical/deep wound)     Status: None (Preliminary result)   Collection Time: 12/12/19  2:06 AM   Specimen: Back; Wound  Result Value Ref Range Status   Specimen Description   Final    BACK LUMBAR SPINE Performed at [facility], [address], [name], [place] [address]    Special Requests   Final    NONE Performed at [facility], [address] [place], [place] [address]    Gram Stain   Final    NO WBC SEEN NO ORGANISMS SEEN Performed at [facility], [place] [address], [place], [place] [address]    Culture FEW GRAM NEGATIVE RODS  Final   Report Status PENDING  Incomplete    IMAGING RESULTS: Extensive postoperative changes within the lumbar spine, as described above, with a 6.2 cm x 4.5 cm area of air and fluid attenuation seen posterior to the spinal canal at the level of the L4 vertebral  body. This may represent a postoperative Collection/abscess. 2. Large, ventral hernias, as described above.3. Sacral decubitus ulcer with additional findings suggestive of acute osteomyelitis. I have personally reviewed the films ? Impression/Recommendation Septic shock secondary to Proteus bacteremia and ? Wounds.  Doubt it is UTI On Levophed On IV antibiotics   Proteus bacteremia.  Differential diagnosis of the source is either the back or permacath. 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  The recent CT abdomen shows 6.2 cm x 4.5 cm area of air and fluid colection seen posterior to the spinal canal at the level of the L4 vertebral body.   She will need to get an MRI. The wound culture is so far gram-negative rod Recommend neurosurgery to see this patient. The collection will have to be drained either by neurosurgery or  by IR. Also recommend removing the permacath after the the collection has been drained.  She has had a permacath for nearly 6 months. Continue cefepime and Flagyl DC vancomycin  Encephalopathy due to the above  Sacral stage IV sacral decubitus new with underlying osteomyelitis. Surgery is debrided the wound.  Cultures so far On cefepime and Flagyl negative.  ?  Discussed the management with the nurse  Note:  This document was prepared using Dragon voice recognition software and may include unintentional dictation errors.

## 2019-12-13 NOTE — Progress Notes (Signed)
[facility]  ROUNDING NOTE   Subjective:  Patient remains critically ill. Still on pressors. Remains lethargic.  Objective:  Vital signs in last 24 hours:  Temp:  [96.5 F (35.8 C)-98.4 F (36.9 C)] 97.6 F (36.4 C) (05/11 1100) Pulse Rate:  [58-77] 72 (05/11 1100) Resp:  [0-17] 9 (05/11 1100) BP: (77-133)/(39-63) 98/52 (05/11 1100) SpO2:  [99 %-100 %] 100 % (05/11 1100)  Weight change:  Filed Weights   12/11/19 1944 12/12/19 0025  Weight: 75 kg 75.8 kg    Intake/Output: I/O last 3 completed shifts: In: 1994.6 [I.V.:876.4; IV Piggyback:1118.2] Out: 100 [Urine:100]   Intake/Output this shift:  Total I/O In: 167.1 [I.V.:67.1; IV Piggyback:100] Out: -   Physical Exam: General: Critically ill-appearing  Head: Normocephalic, atraumatic. Moist oral mucosal membranes  Eyes: Anicteric  Neck: Supple, trachea midline  Lungs:  Clear to auscultation, normal effort  Heart: S1S2 no rubs  Abdomen:  Soft, nontender, bowel sounds present  Extremities: Trace peripheral edema.  Neurologic: Lethargic, difficult to arouse  Skin: Warm/dry  Access: Right IJ PermCath    Basic Metabolic Panel: Recent Labs  Lab 12/11/19 1945 12/12/19 0411 12/12/19 1115 12/13/19 0853  NA 138 135  --  137  K 2.5* 2.7* 3.5 3.1*  CL 102 106  --  107  CO2 26 23  --  22  GLUCOSE 137* 180*  --  88  BUN 20 20  --  29*  CREATININE 2.27* 2.14*  --  2.67*  CALCIUM 7.9* 7.0*  --  7.5*  MG 1.9 1.4*  --  2.8*  PHOS <1.0* 1.7* 2.2* 2.7    Liver Function Tests: Recent Labs  Lab 12/11/19 1945  AST 30  ALT 23  ALKPHOS 90  BILITOT 0.4  PROT 5.1*  ALBUMIN 1.5*   No results for input(s): LIPASE, AMYLASE in the last 168 hours. Recent Labs  Lab 12/11/19 1945  AMMONIA 44*    CBC: Recent Labs  Lab 12/11/19 1945 12/12/19 0411 12/13/19 0853  WBC 11.5* 15.8* 13.8*  NEUTROABS 9.9* 13.9* 11.7*  HGB 8.3* 7.9* 8.0*  HCT 29.4* 26.7* 26.2*  MCV 95.8 92.1 89.4  PLT 141* 134* 109*     Cardiac Enzymes: No results for input(s): CKTOTAL, CKMB, CKMBINDEX, TROPONINI in the last 168 hours.  BNP: Invalid input(s): POCBNP  CBG: Recent Labs  Lab 12/13/19 0026 12/13/19 0359 12/13/19 0708 12/13/19 1136 12/13/19 1159  GLUCAP 94 74 77 63* 67    Microbiology: Results for orders placed or performed during the hospital encounter of 12/11/19  Blood Culture (routine x 2)     Status: None (Preliminary result)   Collection Time: 12/11/19  7:45 PM   Specimen: BLOOD  Result Value Ref Range Status   Specimen Description   Final    BLOOD RIGHT FA Performed at [facility], [address] [place], [place] [address]    Special Requests   Final    BOTTLES DRAWN AEROBIC AND ANAEROBIC Blood Culture adequate volume Performed at [facility], [address] [place], [place] [address]    Culture  Setup Time   Final    GRAM NEGATIVE RODS IN BOTH AEROBIC AND ANAEROBIC BOTTLES CRITICAL RESULT CALLED TO, READ BACK BY AND VERIFIED WITHRayna Sexton AT 3244 ON 12/12/19 SNG Performed at [facility], [place] [address], [place], [place] [address]    Culture GRAM NEGATIVE RODS  Final   Report Status PENDING  Incomplete  Blood Culture (routine x 2)  Status: None (Preliminary result)   Collection Time: 12/11/19  7:45 PM   Specimen: BLOOD  Result Value Ref Range Status   Specimen Description BLOOD RIGHT ARM  Final   Special Requests   Final    BOTTLES DRAWN AEROBIC AND ANAEROBIC Blood Culture adequate volume   Culture  Setup Time   Final    GRAM NEGATIVE RODS IN BOTH AEROBIC AND ANAEROBIC BOTTLES CRITICAL VALUE NOTED.  VALUE IS CONSISTENT WITH PREVIOUSLY REPORTED AND CALLED VALUE. Performed at [facility], [address] [place], [place] [address]    Culture GRAM NEGATIVE RODS  Final   Report Status PENDING  Incomplete  Blood Culture ID Panel (Reflexed)     Status: Abnormal   Collection Time: 12/11/19  7:45 PM  Result Value Ref Range Status    Enterococcus species NOT DETECTED NOT DETECTED Final   Listeria monocytogenes NOT DETECTED NOT DETECTED Final   Staphylococcus species NOT DETECTED NOT DETECTED Final   Staphylococcus aureus (BCID) NOT DETECTED NOT DETECTED Final   Streptococcus species NOT DETECTED NOT DETECTED Final   Streptococcus agalactiae NOT DETECTED NOT DETECTED Final   Streptococcus pneumoniae NOT DETECTED NOT DETECTED Final   Streptococcus pyogenes NOT DETECTED NOT DETECTED Final   Acinetobacter baumannii NOT DETECTED NOT DETECTED Final   Enterobacteriaceae species DETECTED (A) NOT DETECTED Final    Comment: Enterobacteriaceae represent a large family of gram-negative bacteria, not a single organism. CRITICAL RESULT CALLED TO, READ BACK BY AND VERIFIED WITH: Rayna Sexton AT 3845 ON 12/12/19 SNG    Enterobacter cloacae complex NOT DETECTED NOT DETECTED Final   Escherichia coli NOT DETECTED NOT DETECTED Final   Klebsiella oxytoca NOT DETECTED NOT DETECTED Final   Klebsiella pneumoniae NOT DETECTED NOT DETECTED Final   Proteus species DETECTED (A) NOT DETECTED Final    Comment: CRITICAL RESULT CALLED TO, READ BACK BY AND VERIFIED WITH: Rayna Sexton AT 3646 ON 12/12/19 SNG    Serratia marcescens NOT DETECTED NOT DETECTED Final   Carbapenem resistance NOT DETECTED NOT DETECTED Final   Haemophilus influenzae NOT DETECTED NOT DETECTED Final   Neisseria meningitidis NOT DETECTED NOT DETECTED Final   Pseudomonas aeruginosa NOT DETECTED NOT DETECTED Final   Candida albicans NOT DETECTED NOT DETECTED Final   Candida glabrata NOT DETECTED NOT DETECTED Final   Candida krusei NOT DETECTED NOT DETECTED Final   Candida parapsilosis NOT DETECTED NOT DETECTED Final   Candida tropicalis NOT DETECTED NOT DETECTED Final    Comment: Performed at [facility], [address] [place], [place] [address]  Urine culture     Status: Abnormal   Collection Time: 12/11/19  8:09 PM   Specimen: In/Out Cath Urine  Result Value  Ref Range Status   Specimen Description   Final    IN/OUT CATH URINE Performed at [facility], [address], [place], [place] [address]    Special Requests   Final    NONE Performed at [facility], [address] [place],  [address]    Culture MULTIPLE SPECIES PRESENT, SUGGEST RECOLLECTION (A)  Final   Report Status 12/12/2019 FINAL  Final  Respiratory Panel by RT PCR (Flu A&B, Covid) - Nasopharyngeal Swab     Status: None   Collection Time: 12/11/19  9:17 PM   Specimen: Nasopharyngeal Swab  Result Value Ref Range Status   SARS Coronavirus 2 by RT PCR NEGATIVE NEGATIVE Final    Comment: (NOTE) SARS-CoV-2 target nucleic acids are NOT DETECTED. The SARS-CoV-2 RNA  is generally detectable in upper respiratoy specimens during the acute phase of infection. The lowest concentration of SARS-CoV-2 viral copies this assay can detect is 131 copies/mL. A negative result does not preclude SARS-Cov-2 infection and should not be used as the sole basis for treatment or other patient management decisions. A negative result may occur with  improper specimen collection/handling, submission of specimen other than nasopharyngeal swab, presence of viral mutation(s) within the areas targeted by this assay, and inadequate number of viral copies (<131 copies/mL). A negative result must be combined with clinical observations, patient history, and epidemiological information. The expected result is Negative. Fact Sheet for Patients:  PinkCheek.be Fact Sheet for Healthcare Providers:  GravelBags.it This test is not yet ap proved or cleared by the [place] FDA and  has been authorized for detection and/or diagnosis of SARS-CoV-2 by FDA under an Emergency Use Authorization (EUA). This EUA will remain  in effect (meaning this test can be used) for the duration of the COVID-19 declaration under Section 564(b)(1) of  the Act, 21 U.S.C. section 360bbb-3(b)(1), unless the authorization is terminated or revoked sooner.    Influenza A by PCR NEGATIVE NEGATIVE Final   Influenza B by PCR NEGATIVE NEGATIVE Final    Comment: (NOTE) The Xpert Xpress SARS-CoV-2/FLU/RSV assay is intended as an aid in  the diagnosis of influenza from Nasopharyngeal swab specimens and  should not be used as a sole basis for treatment. Nasal washings and  aspirates are unacceptable for Xpert Xpress SARS-CoV-2/FLU/RSV  testing. Fact Sheet for Patients: PinkCheek.be Fact Sheet for Healthcare Providers: GravelBags.it This test is not yet approved or cleared by the [place] FDA and  has been authorized for detection and/or diagnosis of SARS-CoV-2 by  FDA under an Emergency Use Authorization (EUA). This EUA will remain  in effect (meaning this test can be used) for the duration of the  Covid-19 declaration under Section 564(b)(1) of the Act, 21  U.S.C. section 360bbb-3(b)(1), unless the authorization is  terminated or revoked. Performed at [facility], [address] [place], [place] [address]   MRSA PCR Screening     Status: None   Collection Time: 12/12/19 12:29 AM   Specimen: Nasopharyngeal  Result Value Ref Range Status   MRSA by PCR NEGATIVE NEGATIVE Final    Comment:        The GeneXpert MRSA Assay (FDA approved for NASAL specimens only), is one component of a comprehensive MRSA colonization surveillance program. It is not intended to diagnose MRSA infection nor to guide or monitor treatment for MRSA infections. Performed at [facility], [address] [place], [place] [address]   Aerobic/Anaerobic Culture (surgical/deep wound)     Status: None (Preliminary result)   Collection Time: 12/12/19  2:06 AM   Specimen: Sacral; Wound  Result Value Ref Range Status   Specimen Description   Final    SACRAL Performed at [facility], [address], [place], [place] [address]    Special Requests   Final    NONE Performed at [facility], [address] [place], [place] [address]    Gram Stain   Final    NO WBC SEEN NO ORGANISMS SEEN Performed at [facility], [place] [address] [place],  [address]    Culture FEW GRAM NEGATIVE RODS  Final   Report Status PENDING  Incomplete  Aerobic/Anaerobic Culture (surgical/deep wound)     Status: None (Preliminary result)   Collection Time: 12/12/19  2:06  AM   Specimen: Back; Wound  Result Value Ref Range Status   Specimen Description   Final    BACK LUMBAR SPINE Performed at [facility], [address] [place], [place] [address]    Special Requests   Final    NONE Performed at [facility], [address] [place], [place] [address]    Gram Stain   Final    NO WBC SEEN NO ORGANISMS SEEN Performed at [facility], [place] [address], [place], [place] [address]    Culture FEW GRAM NEGATIVE RODS  Final   Report Status PENDING  Incomplete    Coagulation Studies: Recent Labs    12/11/19 1945  LABPROT 18.5*  INR 1.6*    Urinalysis: Recent Labs    12/11/19 2009  COLORURINE AMBER*  LABSPEC 1.011  PHURINE 7.0  GLUCOSEU 50*  HGBUR SMALL*  BILIRUBINUR NEGATIVE  KETONESUR NEGATIVE  PROTEINUR >=300*  NITRITE NEGATIVE  LEUKOCYTESUR MODERATE*      Imaging: CT ABDOMEN PELVIS WO CONTRAST  Result Date: 12/11/2019 CLINICAL DATA:  Unresponsive. EXAM: CT ABDOMEN AND PELVIS WITHOUT CONTRAST TECHNIQUE: Multidetector CT imaging of the abdomen and pelvis was performed following the standard protocol without IV contrast. COMPARISON:  March 29, 2017 FINDINGS: Lower chest: No acute abnormality. Hepatobiliary: No focal liver abnormality is seen. No gallstones, gallbladder wall thickening, or biliary dilatation. Pancreas: Unremarkable. No pancreatic ductal dilatation or surrounding inflammatory changes. Spleen: Normal  in size without focal abnormality. Adrenals/Urinary Tract: Adrenal glands are unremarkable. Kidneys are normal in size, without focal lesions or hydronephrosis. A 6 mm nonobstructing renal stone is seen within the lower pole of the left kidney. A Foley catheter is seen within the urinary bladder. Stomach/Bowel: There is a small hiatal hernia. The appendix is not identified. Surgically anastomosed bowel is seen within the mid sigmoid colon. Noninflamed diverticula are also seen within the proximal sigmoid colon. No evidence of bowel dilatation. Vascular/Lymphatic: Moderate to marked severity aortic calcification. No enlarged abdominal or pelvic lymph nodes. Reproductive: Status post hysterectomy. No adnexal masses. Other: There is a predominant stable 11.9 cm x 3.7 cm ventral hernia seen just above the level of the umbilicus. This contains fat and a segment of nondilated transverse colon. An additional 13.4 cm x 6.2 cm ventral hernia is seen along the anterior aspect of the lower pelvic wall. This is mildly increased in size when compared to the prior study and contains fat and multiple nondilated loops of distal small bowel. Musculoskeletal: Multilevel degenerative changes seen throughout the lumbar spine with a metallic density fusion plate and screws seen along the anterior aspect of the levels of L5 and S1. This represents a new finding when compared to the prior exam. The multiple bilateral pedicle screws seen throughout the lumbar spine on the prior study have been removed. Additional postoperative changes are seen involving the posterior elements of the lumbar spine. A 6.2 cm x 4.5 cm area of air and fluid attenuation is seen posterior to the spinal canal at the level of the L4 vertebral body. Tiny foci of air are seen within the prevertebral soft tissues at the level of L4-L5, and within the adjacent portion of the lower left hemipelvis. These extend to the region adjacent to the mid sigmoid colon. A 3.2 cm x  3.1 cm x 2.0 cm soft tissue defect is seen along the posterior aspect of the lumbar spine at the level of L3 and L4. A 3.4 cm by 0.5 cm sacral decubitus  ulcer is seen along the mid to lower sacrum, to the left of midline. A small amount of adjacent cortical destruction is seen. IMPRESSION: 1. Extensive postoperative changes within the lumbar spine, as described above, with a 6.2 cm x 4.5 cm area of air and fluid attenuation seen posterior to the spinal canal at the level of the L4 vertebral body. This may represent a postoperative collection/abscess. 2. Large, ventral hernias, as described above. 3. Sacral decubitus ulcer with additional findings suggestive of acute osteomyelitis. 4. Stable 6 mm nonobstructing renal stone within the left kidney. 5. Sigmoid diverticulosis. Aortic Atherosclerosis (ICD10-I70.0). Electronically Signed   By: Virgina Norfolk M.D.   On: 12/11/2019 22:02   CT HEAD WO CONTRAST  Result Date: 12/11/2019 CLINICAL DATA:  Unresponsive. EXAM: CT HEAD WITHOUT CONTRAST TECHNIQUE: Contiguous axial images were obtained from the base of the skull through the vertex without intravenous contrast. COMPARISON:  November 19, 2019 FINDINGS: Brain: There is mild to moderate severity cerebral atrophy with widening of the extra-axial spaces and ventricular dilatation. There are areas of decreased attenuation within the white matter tracts of the supratentorial brain, consistent with microvascular disease changes. Vascular: No hyperdense vessel or unexpected calcification. Skull: Normal. Negative for fracture or focal lesion. Sinuses/Orbits: There is mild sphenoid sinus mucosal thickening. Other: None. IMPRESSION: 1. No acute intracranial abnormality. 2. Mild to moderate severity cerebral atrophy and microvascular disease changes of the supratentorial brain. 3. Mild sphenoid sinus disease. Electronically Signed   By: Virgina Norfolk M.D.   On: 12/11/2019 21:32   DG Chest Port 1 View  Result Date:  12/12/2019 CLINICAL DATA:  Central line placement EXAM: PORTABLE CHEST 1 VIEW COMPARISON:  12/11/2019 FINDINGS: Single frontal view of the chest demonstrates left internal jugular catheter tip overlying superior vena cava. Stable right internal jugular dialysis catheter. The cardiac silhouette is enlarged. There is central vascular congestion, with interval development of patchy bilateral perihilar ground-glass airspace disease. No effusion or pneumothorax. No acute bony abnormalities. IMPRESSION: 1. No complication after left internal jugular catheter placement. 2. Worsening volume status, with development of mild pulmonary edema. Electronically Signed   By: Randa Ngo M.D.   On: 12/12/2019 02:03   DG Chest Port 1 View  Result Date: 12/11/2019 CLINICAL DATA:  79 year old female with sepsis. EXAM: PORTABLE CHEST 1 VIEW COMPARISON:  Chest radiograph dated 11/19/2019. FINDINGS: Dialysis catheter in similar position. There is stable cardiomegaly. Probable mild vascular congestion. No edema. No focal consolidation, pleural effusion, pneumothorax. Atherosclerotic calcification of the aorta. No acute osseous pathology. Degenerative changes of the shoulders. IMPRESSION: No focal consolidation.  Probable mild vascular congestion. Electronically Signed   By: Anner Crete M.D.   On: 12/11/2019 20:33     Medications:   . sodium chloride Stopped (12/12/19 2140)  . albumin human    . ceFEPime (MAXIPIME) IV Stopped (12/12/19 2040)  . metronidazole Stopped (12/13/19 0600)  . norepinephrine (LEVOPHED) Adult infusion 3 mcg/min (12/13/19 1100)  . vancomycin     . budesonide (PULMICORT) nebulizer solution  0.25 mg Nebulization BID  . chlorhexidine  15 mL Mouth Rinse BID  . Chlorhexidine Gluconate Cloth  6 each Topical Q0600  . Chlorhexidine Gluconate Cloth  6 each Topical Q0600  . heparin  5,000 Units Subcutaneous Q8H  . ipratropium-albuterol  3 mL Nebulization Q4H  . mouth rinse  15 mL Mouth Rinse q12n4p   . sodium chloride flush  10-40 mL Intracatheter Q12H   acetaminophen, docusate sodium, ipratropium-albuterol, morphine injection, ondansetron (ZOFRAN) IV, polyethylene  glycol, sodium chloride flush  Assessment/ Plan:  79 y.o. female  is a 79 y.o. black female with end stage renal disease on hemodialysis, dementia, diabetes mellitus type II, hypertension, hypothyroidism, osteomyelitis, now admitted with decreased responsiveness and sepsis.  [facility] ([place]) [facility] [address] TTS RIJ [id number].  1.  ESRD on HD TTS.  Patient due for dialysis today.  No ultrafiltration planned as she is still hypotensive.  2.  Altered mental status.  Likely secondary to sepsis.  Continue to monitor mental status.  3.  Anemia of chronic kidney disease.   Lab Results  Component Value Date   HGB 8.0 (L) 12/13/2019  Start the patient on Epogen 10,000 units IV with dialysis treatments.  4.  Secondary hyperparathyroidism.  Phosphorus currently 2.7 and acceptable.  5.  Sepsis.  Proteus growing in the blood.  Patient remains on cefepime and metronidazole.   LOS: 2 Milen Lengacher 5/11/202112:20 PM

## 2019-12-14 LAB — CBC WITH DIFFERENTIAL/PLATELET
Abs Immature Granulocytes: 0.2 10*3/uL — ABNORMAL HIGH (ref 0.00–0.07)
Basophils Absolute: 0 10*3/uL (ref 0.0–0.1)
Basophils Relative: 0 %
Eosinophils Absolute: 0.1 10*3/uL (ref 0.0–0.5)
Eosinophils Relative: 1 %
HCT: 22.6 % — ABNORMAL LOW (ref 36.0–46.0)
Hemoglobin: 6.7 g/dL — ABNORMAL LOW (ref 12.0–15.0)
Immature Granulocytes: 2 %
Lymphocytes Relative: 14 %
Lymphs Abs: 1.2 10*3/uL (ref 0.7–4.0)
MCH: 26.6 pg (ref 26.0–34.0)
MCHC: 29.6 g/dL — ABNORMAL LOW (ref 30.0–36.0)
MCV: 89.7 fL (ref 80.0–100.0)
Monocytes Absolute: 0.5 10*3/uL (ref 0.1–1.0)
Monocytes Relative: 6 %
Neutro Abs: 6.7 10*3/uL (ref 1.7–7.7)
Neutrophils Relative %: 77 %
Platelets: 82 10*3/uL — ABNORMAL LOW (ref 150–400)
RBC: 2.52 MIL/uL — ABNORMAL LOW (ref 3.87–5.11)
RDW: 21.6 % — ABNORMAL HIGH (ref 11.5–15.5)
WBC: 8.8 10*3/uL (ref 4.0–10.5)
nRBC: 0.3 % — ABNORMAL HIGH (ref 0.0–0.2)

## 2019-12-14 LAB — HEPATITIS B SURFACE ANTIBODY, QUANTITATIVE: Hep B S AB Quant (Post): 21 m[IU]/mL (ref >9.9)

## 2019-12-14 LAB — HEPATITIS B SURFACE ANTIGEN: Hepatitis B Surface Ag: NEGATIVE — AB

## 2019-12-14 LAB — GLUCOSE, CAPILLARY
Glucose-Capillary: 101 mg/dL — ABNORMAL HIGH (ref 70–99)
Glucose-Capillary: 101 mg/dL — ABNORMAL HIGH (ref 70–99)
Glucose-Capillary: 105 mg/dL — ABNORMAL HIGH (ref 70–99)
Glucose-Capillary: 72 mg/dL (ref 70–99)
Glucose-Capillary: 77 mg/dL (ref 70–99)
Glucose-Capillary: 78 mg/dL (ref 70–99)
Glucose-Capillary: 80 mg/dL (ref 70–99)

## 2019-12-14 LAB — BASIC METABOLIC PANEL
Anion gap: 8 (ref 5–15)
BUN: 14 mg/dL (ref 8–23)
CO2: 26 mmol/L (ref 22–32)
Calcium: 7.2 mg/dL — ABNORMAL LOW (ref 8.9–10.3)
Chloride: 100 mmol/L (ref 98–111)
Creatinine, Ser: 1.8 mg/dL — ABNORMAL HIGH (ref 0.44–1.00)
GFR calc Af Amer: 31 mL/min — ABNORMAL LOW (ref >60)
GFR calc non Af Amer: 27 mL/min — ABNORMAL LOW (ref >60)
Glucose, Bld: 77 mg/dL (ref 70–99)
Potassium: 2.9 mmol/L — ABNORMAL LOW (ref 3.5–5.1)
Sodium: 134 mmol/L — ABNORMAL LOW (ref 135–145)

## 2019-12-14 LAB — MAGNESIUM: Magnesium: 2.1 mg/dL (ref 1.7–2.4)

## 2019-12-14 LAB — PHOSPHORUS: Phosphorus: 1.6 mg/dL — ABNORMAL LOW (ref 2.5–4.6)

## 2019-12-14 LAB — PARATHYROID HORMONE, INTACT (NO CA): PTH: 68 pg/mL — ABNORMAL HIGH (ref 15–65)

## 2019-12-14 MED ORDER — POTASSIUM PHOSPHATES 15 MMOLE/5ML IV SOLN
30.0000 mmol | Freq: Once | INTRAVENOUS | Status: AC
  Administered 2019-12-14: 30 mmol via INTRAVENOUS
  Filled 2019-12-14: qty 10

## 2019-12-14 MED ORDER — PANTOPRAZOLE SODIUM 40 MG IV SOLR
40.0000 mg | INTRAVENOUS | Status: DC
  Administered 2019-12-14 – 2019-12-24 (×11): 40 mg via INTRAVENOUS
  Filled 2019-12-14 (×11): qty 40

## 2019-12-14 NOTE — Progress Notes (Signed)
   Daily Progress Note   Patient Name: Taylor Tyler       Date: 12/14/2019 DOB: [birth year]  Age: 79 y.o. MRN#: [id number] Attending Physician: Flora Lipps, MD Primary Care Physician: McLean-Scocuzza, Nino Glow, MD Admit Date: 12/11/2019  Reason for Consultation/Follow-up: Establishing goals of care  Goals of Care  Chart reviewed. Updates received from RN. Patient remains lethargic. No family at the bedside. I was able to speak with son, [name]. He was requesting for patient's sisters to visit in case patient was to pass away. Support given. Education provided on visitation policy and ability for other family to visit without restrictions once family had made a decision to transition care to full comfort. Son verbalized understanding. He expressed he is in continued discussion with his brother and is not prepared to make a final decision.   He states he has another call that he has take but plans to call me back later today for further discussion.   Length of Stay: 3 days  Vital Signs: BP (!) 103/44 (BP Location: Left Arm)   Pulse 84   Temp 98 F (36.7 C) (Axillary)   Resp (!) 9   Ht 5\' 6"  (1.676 m)   Wt 75.8 kg   SpO2 97%   BMI 26.97 kg/m  SpO2: SpO2: 97 % O2 Device: O2 Device: Room Air O2 Flow Rate: O2 Flow Rate (L/min): 1 L/min  Intake/output summary:   Intake/Output Summary (Last 24 hours) at 12/14/2019 1227 Last data filed at 12/14/2019 1141 Gross per 24 hour  Intake 1021.63 ml  Output -500 ml  Net 1521.63 ml   LBM:   Baseline Weight: Weight: 75 kg Most recent weight: Weight: 75.8 kg   Palliative Care Assessment & Plan    Code Status:  Full code Goals of Care:  Pending final decisions and further discussions with sons. Family realistic in conversation and discussions regarding end-of-life. They are preparing themselves emotionally.   PMT will continue to support and follow   Prognosis: Poor   Discharge Planning: To Be Determined  Thank you for allowing the  Palliative Medicine Team to assist in the care of this patient.  Time Total: 20 min.   Visit consisted of counseling and education dealing with the complex and emotionally intense issues of symptom management and palliative care in the setting of serious and potentially life-threatening illness.Greater than 50%  of this time was spent counseling and coordinating care related to the above assessment and plan.  Alda Lea, AGPCNP-BC  Palliative Medicine Team [phone]

## 2019-12-14 NOTE — Progress Notes (Signed)
[facility]  ROUNDING NOTE   Subjective:  Patient lethargic but arousable. Weaned off of pressors. Underwent dialysis treatment yesterday.  Objective:  Vital signs in last 24 hours:  Temp:  [97.8 F (36.6 C)-98.2 F (36.8 C)] 98 F (36.7 C) (05/12 1200) Pulse Rate:  [78-103] 82 (05/12 1300) Resp:  [6-22] 10 (05/12 1300) BP: (85-126)/(38-82) 101/48 (05/12 1300) SpO2:  [94 %-100 %] 97 % (05/12 1300)  Weight change:  Filed Weights   12/11/19 1944 12/12/19 0025  Weight: 75 kg 75.8 kg    Intake/Output: I/O last 3 completed shifts: In: 1016.3 [I.V.:516.3; IV Piggyback:500] Out: -500    Intake/Output this shift:  Total I/O In: 534.1 [I.V.:434.1; IV Piggyback:100] Out: 0   Physical Exam: General: Critically ill-appearing  Head: Normocephalic, atraumatic. Moist oral mucosal membranes  Eyes: Anicteric  Neck: Supple, trachea midline  Lungs:  Clear to auscultation, normal effort  Heart: S1S2 no rubs  Abdomen:  Soft, nontender, bowel sounds present  Extremities: Trace peripheral edema.  Neurologic: Lethargic, but arousable.  Skin: Warm/dry  Access: Right IJ PermCath    Basic Metabolic Panel: Recent Labs  Lab 12/11/19 1945 12/11/19 1945 12/12/19 0411 12/12/19 1115 12/13/19 0853 12/14/19 0600  NA 138  --  135  --  137 134*  K 2.5*  --  2.7* 3.5 3.1* 2.9*  CL 102  --  106  --  107 100  CO2 26  --  23  --  22 26  GLUCOSE 137*  --  180*  --  88 77  BUN 20  --  20  --  29* 14  CREATININE 2.27*  --  2.14*  --  2.67* 1.80*  CALCIUM 7.9*   < > 7.0*  --  7.5* 7.2*  MG 1.9  --  1.4*  --  2.8* 2.1  PHOS <1.0*  --  1.7* 2.2* 2.7 1.6*   < > = values in this interval not displayed.    Liver Function Tests: Recent Labs  Lab 12/11/19 1945  AST 30  ALT 23  ALKPHOS 90  BILITOT 0.4  PROT 5.1*  ALBUMIN 1.5*   No results for input(s): LIPASE, AMYLASE in the last 168 hours. Recent Labs  Lab 12/11/19 1945  AMMONIA 44*    CBC: Recent Labs  Lab  12/11/19 1945 12/12/19 0411 12/13/19 0853 12/14/19 0600  WBC 11.5* 15.8* 13.8* 8.8  NEUTROABS 9.9* 13.9* 11.7* 6.7  HGB 8.3* 7.9* 8.0* 6.7*  HCT 29.4* 26.7* 26.2* 22.6*  MCV 95.8 92.1 89.4 89.7  PLT 141* 134* 109* 82*    Cardiac Enzymes: No results for input(s): CKTOTAL, CKMB, CKMBINDEX, TROPONINI in the last 168 hours.  BNP: Invalid input(s): POCBNP  CBG: Recent Labs  Lab 12/13/19 2007 12/14/19 0059 12/14/19 0427 12/14/19 0723 12/14/19 1103  GLUCAP 70 72 77 78 80    Microbiology: Results for orders placed or performed during the hospital encounter of 12/11/19  Blood Culture (routine x 2)     Status: Abnormal (Preliminary result)   Collection Time: 12/11/19  7:45 PM   Specimen: BLOOD  Result Value Ref Range Status   Specimen Description   Final    BLOOD RIGHT FA Performed at [facility], [address], [place], [place] [address]    Special Requests   Final    BOTTLES DRAWN AEROBIC AND ANAEROBIC Blood Culture adequate volume Performed at [facility], [address] [place], [place] [address]    Culture  Setup Time  Final    GRAM NEGATIVE RODS IN BOTH AEROBIC AND ANAEROBIC BOTTLES CRITICAL RESULT CALLED TO, READ BACK BY AND VERIFIED WITHRayna Sexton AT 8563 ON 12/12/19 SNG Performed at [facility], [place] [address] [place], [place] [address]    Culture PROTEUS MIRABILIS CITROBACTER YOUNGAE  (A)  Final   Report Status PENDING  Incomplete  Blood Culture (routine x 2)     Status: Abnormal (Preliminary result)   Collection Time: 12/11/19  7:45 PM   Specimen: BLOOD  Result Value Ref Range Status   Specimen Description   Final    BLOOD RIGHT ARM Performed at [facility], [address], [place], [place] [address]    Special Requests   Final    BOTTLES DRAWN AEROBIC AND ANAEROBIC Blood Culture adequate volume Performed at [facility], [address] [place], [place] [address]    Culture  Setup Time   Final     GRAM NEGATIVE RODS IN BOTH AEROBIC AND ANAEROBIC BOTTLES CRITICAL VALUE NOTED.  VALUE IS CONSISTENT WITH PREVIOUSLY REPORTED AND CALLED VALUE. Performed at [facility], [address] [place], [place] [address]    Culture (A)  Final    PROTEUS MIRABILIS CITROBACTER YOUNGAE SUSCEPTIBILITIES TO FOLLOW Performed at [facility], [place] [address], [place], [place] [address]    Report Status PENDING  Incomplete   Organism ID, Bacteria PROTEUS MIRABILIS  Final      Susceptibility   Proteus mirabilis - MIC*    AMPICILLIN <=2 SENSITIVE Sensitive     CEFAZOLIN <=4 SENSITIVE Sensitive     CEFEPIME <=1 SENSITIVE Sensitive     CEFTAZIDIME <=1 SENSITIVE Sensitive     CEFTRIAXONE <=1 SENSITIVE Sensitive     CIPROFLOXACIN <=0.25 SENSITIVE Sensitive     GENTAMICIN <=1 SENSITIVE Sensitive     IMIPENEM 4 SENSITIVE Sensitive     TRIMETH/SULFA <=20 SENSITIVE Sensitive     AMPICILLIN/SULBACTAM <=2 SENSITIVE Sensitive     PIP/TAZO <=4 SENSITIVE Sensitive     * PROTEUS MIRABILIS  Blood Culture ID Panel (Reflexed)     Status: Abnormal   Collection Time: 12/11/19  7:45 PM  Result Value Ref Range Status   Enterococcus species NOT DETECTED NOT DETECTED Final   Listeria monocytogenes NOT DETECTED NOT DETECTED Final   Staphylococcus species NOT DETECTED NOT DETECTED Final   Staphylococcus aureus (BCID) NOT DETECTED NOT DETECTED Final   Streptococcus species NOT DETECTED NOT DETECTED Final   Streptococcus agalactiae NOT DETECTED NOT DETECTED Final   Streptococcus pneumoniae NOT DETECTED NOT DETECTED Final   Streptococcus pyogenes NOT DETECTED NOT DETECTED Final   Acinetobacter baumannii NOT DETECTED NOT DETECTED Final   Enterobacteriaceae species DETECTED (A) NOT DETECTED Final    Comment: Enterobacteriaceae represent a large family of gram-negative bacteria, not a single organism. CRITICAL RESULT CALLED TO, READ BACK BY AND VERIFIED WITH: Rayna Sexton AT 7672 ON 12/12/19 SNG     Enterobacter cloacae complex NOT DETECTED NOT DETECTED Final   Escherichia coli NOT DETECTED NOT DETECTED Final   Klebsiella oxytoca NOT DETECTED NOT DETECTED Final   Klebsiella pneumoniae NOT DETECTED NOT DETECTED Final   Proteus species DETECTED (A) NOT DETECTED Final    Comment: CRITICAL RESULT CALLED TO, READ BACK BY AND VERIFIED WITH: Rayna Sexton AT 0947 ON 12/12/19 SNG    Serratia marcescens NOT DETECTED NOT DETECTED Final   Carbapenem resistance NOT DETECTED NOT DETECTED Final   Haemophilus influenzae NOT DETECTED NOT DETECTED Final  Neisseria meningitidis NOT DETECTED NOT DETECTED Final   Pseudomonas aeruginosa NOT DETECTED NOT DETECTED Final   Candida albicans NOT DETECTED NOT DETECTED Final   Candida glabrata NOT DETECTED NOT DETECTED Final   Candida krusei NOT DETECTED NOT DETECTED Final   Candida parapsilosis NOT DETECTED NOT DETECTED Final   Candida tropicalis NOT DETECTED NOT DETECTED Final    Comment: Performed at [facility], [address], [place], [place] [address]  Urine culture     Status: Abnormal   Collection Time: 12/11/19  8:09 PM   Specimen: In/Out Cath Urine  Result Value Ref Range Status   Specimen Description   Final    IN/OUT CATH URINE Performed at [facility], [address], [place], [place] [address]    Special Requests   Final    NONE Performed at [facility], [address] [place], [place] [address]    Culture MULTIPLE SPECIES PRESENT, SUGGEST RECOLLECTION (A)  Final   Report Status 12/12/2019 FINAL  Final  Respiratory Panel by RT PCR (Flu A&B, Covid) - Nasopharyngeal Swab     Status: None   Collection Time: 12/11/19  9:17 PM   Specimen: Nasopharyngeal Swab  Result Value Ref Range Status   SARS Coronavirus 2 by RT PCR NEGATIVE NEGATIVE Final    Comment: (NOTE) SARS-CoV-2 target nucleic acids are NOT DETECTED. The SARS-CoV-2 RNA is generally detectable in upper respiratoy specimens during the acute phase of  infection. The lowest concentration of SARS-CoV-2 viral copies this assay can detect is 131 copies/mL. A negative result does not preclude SARS-Cov-2 infection and should not be used as the sole basis for treatment or other patient management decisions. A negative result may occur with  improper specimen collection/handling, submission of specimen other than nasopharyngeal swab, presence of viral mutation(s) within the areas targeted by this assay, and inadequate number of viral copies (<131 copies/mL). A negative result must be combined with clinical observations, patient history, and epidemiological information. The expected result is Negative. Fact Sheet for Patients:  PinkCheek.be Fact Sheet for Healthcare Providers:  GravelBags.it This test is not yet ap proved or cleared by the [place] FDA and  has been authorized for detection and/or diagnosis of SARS-CoV-2 by FDA under an Emergency Use Authorization (EUA). This EUA will remain  in effect (meaning this test can be used) for the duration of the COVID-19 declaration under Section 564(b)(1) of the Act, 21 U.S.C. section 360bbb-3(b)(1), unless the authorization is terminated or revoked sooner.    Influenza A by PCR NEGATIVE NEGATIVE Final   Influenza B by PCR NEGATIVE NEGATIVE Final    Comment: (NOTE) The Xpert Xpress SARS-CoV-2/FLU/RSV assay is intended as an aid in  the diagnosis of influenza from Nasopharyngeal swab specimens and  should not be used as a sole basis for treatment. Nasal washings and  aspirates are unacceptable for Xpert Xpress SARS-CoV-2/FLU/RSV  testing. Fact Sheet for Patients: PinkCheek.be Fact Sheet for Healthcare Providers: GravelBags.it This test is not yet approved or cleared by the [place] FDA and  has been authorized for detection and/or diagnosis of SARS-CoV-2 by  FDA under  an Emergency Use Authorization (EUA). This EUA will remain  in effect (meaning this test can be used) for the duration of the  Covid-19 declaration under Section 564(b)(1) of the Act, 21  U.S.C. section 360bbb-3(b)(1), unless the authorization is  terminated or revoked. Performed at [facility], [address], [place], [place] [address]   MRSA PCR Screening  Status: None   Collection Time: 12/12/19 12:29 AM   Specimen: Nasopharyngeal  Result Value Ref Range Status   MRSA by PCR NEGATIVE NEGATIVE Final    Comment:        The GeneXpert MRSA Assay (FDA approved for NASAL specimens only), is one component of a comprehensive MRSA colonization surveillance program. It is not intended to diagnose MRSA infection nor to guide or monitor treatment for MRSA infections. Performed at [facility], [address] [place], [place] [address]   Aerobic/Anaerobic Culture (surgical/deep wound)     Status: None (Preliminary result)   Collection Time: 12/12/19  2:06 AM   Specimen: Sacral; Wound  Result Value Ref Range Status   Specimen Description   Final    SACRAL Performed at [facility], [address] [place], [place] [address]    Special Requests   Final    NONE Performed at [facility], [address] [place], [place] [address]    Gram Stain   Final    NO WBC SEEN NO ORGANISMS SEEN Performed at [facility], [place] [address] [place], [place] [address]    Culture   Final    FEW ESCHERICHIA COLI FEW CITROBACTER SPECIES FEW PROTEUS MIRABILIS    Report Status PENDING  Incomplete   Organism ID, Bacteria ESCHERICHIA COLI  Final   Organism ID, Bacteria CITROBACTER SPECIES  Final      Susceptibility   Citrobacter species - MIC*    CEFAZOLIN >=64 RESISTANT Resistant     CEFEPIME <=1 SENSITIVE Sensitive     CEFTAZIDIME >=64 RESISTANT Resistant     CEFTRIAXONE >=64 RESISTANT Resistant     CIPROFLOXACIN <=0.25 SENSITIVE Sensitive      GENTAMICIN <=1 SENSITIVE Sensitive     IMIPENEM <=0.25 SENSITIVE Sensitive     TRIMETH/SULFA <=20 SENSITIVE Sensitive     PIP/TAZO 64 INTERMEDIATE Intermediate     * FEW CITROBACTER SPECIES   Escherichia coli - MIC*    AMPICILLIN 16 INTERMEDIATE Intermediate     CEFAZOLIN >=64 RESISTANT Resistant     CEFEPIME <=1 SENSITIVE Sensitive     CEFTAZIDIME <=1 SENSITIVE Sensitive     CEFTRIAXONE <=1 SENSITIVE Sensitive     CIPROFLOXACIN <=0.25 SENSITIVE Sensitive     GENTAMICIN <=1 SENSITIVE Sensitive     IMIPENEM <=0.25 SENSITIVE Sensitive     TRIMETH/SULFA <=20 SENSITIVE Sensitive     AMPICILLIN/SULBACTAM 4 SENSITIVE Sensitive     PIP/TAZO <=4 SENSITIVE Sensitive     * FEW ESCHERICHIA COLI  Aerobic/Anaerobic Culture (surgical/deep wound)     Status: None (Preliminary result)   Collection Time: 12/12/19  2:06 AM   Specimen: Back; Wound  Result Value Ref Range Status   Specimen Description   Final    BACK LUMBAR SPINE Performed at [facility], [address] [place], [place] [address]    Special Requests   Final    NONE Performed at [facility], [address] [place], [place] [address]    Gram Stain   Final    NO WBC SEEN NO ORGANISMS SEEN Performed at [facility], [place] [address] [place], [place] [address]    Culture   Final    FEW PROTEUS MIRABILIS NO ANAEROBES ISOLATED; CULTURE IN PROGRESS FOR 5 DAYS    Report Status PENDING  Incomplete   Organism ID, Bacteria PROTEUS MIRABILIS  Final      Susceptibility   Proteus mirabilis - MIC*  AMPICILLIN <=2 SENSITIVE Sensitive     CEFAZOLIN <=4 SENSITIVE Sensitive     CEFEPIME <=1 SENSITIVE Sensitive     CEFTAZIDIME <=1 SENSITIVE Sensitive     CEFTRIAXONE <=1 SENSITIVE Sensitive     CIPROFLOXACIN <=0.25 SENSITIVE Sensitive     GENTAMICIN 2 SENSITIVE Sensitive     IMIPENEM 4 SENSITIVE Sensitive     TRIMETH/SULFA <=20 SENSITIVE Sensitive     AMPICILLIN/SULBACTAM <=2 SENSITIVE Sensitive      PIP/TAZO <=4 SENSITIVE Sensitive     * FEW PROTEUS MIRABILIS    Coagulation Studies: Recent Labs    12/11/19 1945  LABPROT 18.5*  INR 1.6*    Urinalysis: Recent Labs    12/11/19 2009  COLORURINE AMBER*  LABSPEC 1.011  PHURINE 7.0  GLUCOSEU 50*  HGBUR SMALL*  BILIRUBINUR NEGATIVE  KETONESUR NEGATIVE  PROTEINUR >=300*  NITRITE NEGATIVE  LEUKOCYTESUR MODERATE*      Imaging: No results found.   Medications:   . sodium chloride Stopped (12/12/19 2140)  . sodium chloride    . sodium chloride    . albumin human 25 g (12/13/19 1516)  . ceFEPime (MAXIPIME) IV Stopped (12/13/19 2041)  . dextrose 50 mL/hr at 12/14/19 1141  . metronidazole Stopped (12/14/19 2952)  . norepinephrine (LEVOPHED) Adult infusion Stopped (12/13/19 2330)  . potassium PHOSPHATE IVPB (in mmol) 30 mmol (12/14/19 1234)   . budesonide (PULMICORT) nebulizer solution  0.25 mg Nebulization BID  . chlorhexidine  15 mL Mouth Rinse BID  . Chlorhexidine Gluconate Cloth  6 each Topical Q0600  . Chlorhexidine Gluconate Cloth  6 each Topical Q0600  . [START ON 12/15/2019] epoetin (EPOGEN/PROCRIT) injection  10,000 Units Intravenous Q T,Th,Sa-HD  . heparin  5,000 Units Subcutaneous Q8H  . ipratropium-albuterol  3 mL Nebulization Q4H  . mouth rinse  15 mL Mouth Rinse q12n4p  . pantoprazole (PROTONIX) IV  40 mg Intravenous Q24H  . sodium chloride flush  10-40 mL Intracatheter Q12H   sodium chloride, sodium chloride, acetaminophen, alteplase, docusate sodium, heparin, ipratropium-albuterol, lidocaine (PF), lidocaine-prilocaine, morphine injection, ondansetron (ZOFRAN) IV, pentafluoroprop-tetrafluoroeth, polyethylene glycol, sodium chloride flush  Assessment/ Plan:  79 y.o. female  is a 79 y.o. black female with end stage renal disease on hemodialysis, dementia, diabetes mellitus type II, hypertension, hypothyroidism, osteomyelitis, now admitted with decreased responsiveness and sepsis.  [facility]  ([place]) [facility] [address] TTS RIJ [id number].  1.  ESRD on HD TTS.  Patient underwent dialysis yesterday.  No acute indication for dialysis today..  2.  Altered mental status.  Remains lethargic but is a bit more arousable today.  3.  Anemia of chronic kidney disease.   Lab Results  Component Value Date   HGB 6.7 (L) 12/14/2019  Hemoglobin down to 6.7.  Recommend blood transfusion but defer to primary team.  4.  Secondary hyperparathyroidism.  Phosphorus noted to be low.  Agree with replacement by pharmacy.  5.  Sepsis.  Proteus growing in the blood.  Continue cefepime and metronidazole.  Infectious disease consulted.  6.  Overall prognosis quite guarded.   LOS: 3 [place] Bolds 5/12/20212:01 PM

## 2019-12-14 NOTE — Progress Notes (Signed)
CRITICAL CARE NOTE 79 yo female with ESRD on HD admitted with acute toxic metabolic encephalopathy secondary to severe sepsis in the setting of UTI, chronic nonhealing sacral decubitus ulcer CT suggestive of osteomyelitis along with L4 vertebral body postoperative collection/abscess requiring levophed gtt   SIGNIFICANT EVENTS/STUDIES:  05/9: Pt presented to [facility] ER minimally responsive and hypoxic.   05/9: Pt admitted to ICU with septic shock requiring levophed gtt with improvement in mentation and hypoxia resolved 05/9: CT Head revealed no acute intracranial abnormality. Mild to moderate severity cerebral atrophy and microvascular disease changes of the supratentorial brain. Mild sphenoid sinus disease. 05/9: CT Abd/Pelvis revealed extensive postoperative changes within the lumbar spine, as described above, with a 6.2 cm x 4.5 cm area of air and fluid attenuation seen posterior to the spinal canal at the level of the L4 vertebral body. This may represent a postoperative collection/abscess. Large, ventral hernias, as described above. Sacral decubitus ulcer with additional findings suggestive of acute osteomyelitis. Stable 6 mm nonobstructing renal stone within the left kidney. Sigmoid diverticulosis.  5/10 remains on pressors, slight increased WOB, lethargic   CC  Follow up SEPTIC SHOCK  HPI Looks ill, off pressors Lethargic Poor prognosis    BP (!) 105/38   Pulse 78   Temp 98.2 F (36.8 C) (Axillary)   Resp (!) 9   Ht 5\' 6"  (1.676 m)   Wt 75.8 kg   SpO2 99%   BMI 26.97 kg/m    I/O last 3 completed shifts: In: 1016.3 [I.V.:516.3; IV Piggyback:500] Out: -500  No intake/output data recorded.  SpO2: 99 % O2 Flow Rate (L/min): 1 L/min  Estimated body mass index is 26.97 kg/m as calculated from the following:   Height as of this encounter: 5\' 6"  (1.676 m).   Weight as of this encounter: 75.8 kg.  REVIEW OF SYSTEMS  PATIENT IS UNABLE TO PROVIDE COMPLETE REVIEW OF SYSTEM   DUE TO ENCEPHALOPATHY    Pressure Injury 11/19/19 Sacrum Mid;Right Unstageable - Full thickness tissue loss in which the base of the injury is covered by slough (yellow, tan, gray, green or brown) and/or eschar (tan, brown or black) in the wound bed. deep wound with bed covered  (Active)  11/19/19 1800  Location: Sacrum  Location Orientation: Mid;Right  Staging: Unstageable - Full thickness tissue loss in which the base of the injury is covered by slough (yellow, tan, gray, green or brown) and/or eschar (tan, brown or black) in the wound bed.  Wound Description (Comments): deep wound with bed covered by slough  Present on Admission: Yes     Pressure Injury 11/19/19 Heel Left Deep Tissue Pressure Injury - Purple or maroon localized area of discolored intact skin or blood-filled blister due to damage of underlying soft tissue from pressure and/or shear. (Active)  11/19/19 1800  Location: Heel  Location Orientation: Left  Staging: Deep Tissue Pressure Injury - Purple or maroon localized area of discolored intact skin or blood-filled blister due to damage of underlying soft tissue from pressure and/or shear.  Wound Description (Comments):   Present on Admission: Yes    Physical Examination:   General Appearance: No distress  Neuro:without focal findings,  HEENT: PERRLA, EOM intact.   Pulmonary: normal breath sounds, No wheezing.  CardiovascularNormal S1,S2.  No m/r/g.   Abdomen: Benign, Soft, non-tender. Skin:   STAGE 4 sacral decub Extremities: +edema PSYCHIATRIC:lethargic poor insight   MEDICATIONS: I have reviewed all medications and confirmed regimen as documented   CULTURE RESULTS  Recent Results (from the past 240 hour(s))  Blood Culture (routine x 2)     Status: Abnormal (Preliminary result)   Collection Time: 12/11/19  7:45 PM   Specimen: BLOOD  Result Value Ref Range Status   Specimen Description   Final    BLOOD RIGHT FA Performed at [facility], [address], [place], [place] [address]    Special Requests   Final    BOTTLES DRAWN AEROBIC AND ANAEROBIC Blood Culture adequate volume Performed at [facility], [address] [place], [place] [address]    Culture  Setup Time   Final    GRAM NEGATIVE RODS IN BOTH AEROBIC AND ANAEROBIC BOTTLES CRITICAL RESULT CALLED TO, READ BACK BY AND VERIFIED WITHRayna Sexton AT 8182 ON 12/12/19 SNG Performed at [facility], [place] [address] [place], [place] [address]    Culture PROTEUS MIRABILIS (A)  Final   Report Status PENDING  Incomplete  Blood Culture (routine x 2)     Status: Abnormal (Preliminary result)   Collection Time: 12/11/19  7:45 PM   Specimen: BLOOD  Result Value Ref Range Status   Specimen Description   Final    BLOOD RIGHT ARM Performed at [facility], [address], [place], [place] [address]    Special Requests   Final    BOTTLES DRAWN AEROBIC AND ANAEROBIC Blood Culture adequate volume Performed at [facility], [address] [place], [place] [address]    Culture  Setup Time   Final    GRAM NEGATIVE RODS IN BOTH AEROBIC AND ANAEROBIC BOTTLES CRITICAL VALUE NOTED.  VALUE IS CONSISTENT WITH PREVIOUSLY REPORTED AND CALLED VALUE. Performed at [facility], [address] [place], [place] [address]    Culture PROTEUS MIRABILIS (A)  Final   Report Status PENDING  Incomplete  Blood Culture ID Panel (Reflexed)     Status: Abnormal   Collection Time: 12/11/19  7:45 PM  Result Value Ref Range Status   Enterococcus species NOT DETECTED NOT DETECTED Final   Listeria monocytogenes NOT DETECTED NOT DETECTED Final   Staphylococcus species NOT DETECTED NOT DETECTED Final   Staphylococcus aureus (BCID) NOT DETECTED NOT DETECTED Final   Streptococcus species NOT DETECTED NOT DETECTED Final   Streptococcus agalactiae NOT DETECTED NOT DETECTED Final   Streptococcus pneumoniae NOT DETECTED NOT DETECTED Final   Streptococcus pyogenes  NOT DETECTED NOT DETECTED Final   Acinetobacter baumannii NOT DETECTED NOT DETECTED Final   Enterobacteriaceae species DETECTED (A) NOT DETECTED Final    Comment: Enterobacteriaceae represent a large family of gram-negative bacteria, not a single organism. CRITICAL RESULT CALLED TO, READ BACK BY AND VERIFIED WITH: Rayna Sexton AT 2585 ON 12/12/19 SNG    Enterobacter cloacae complex NOT DETECTED NOT DETECTED Final   Escherichia coli NOT DETECTED NOT DETECTED Final   Klebsiella oxytoca NOT DETECTED NOT DETECTED Final   Klebsiella pneumoniae NOT DETECTED NOT DETECTED Final   Proteus species DETECTED (A) NOT DETECTED Final    Comment: CRITICAL RESULT CALLED TO, READ BACK BY AND VERIFIED WITH: Rayna Sexton AT 2778 ON 12/12/19 SNG    Serratia marcescens NOT DETECTED NOT DETECTED Final   Carbapenem resistance NOT DETECTED NOT DETECTED Final   Haemophilus influenzae NOT DETECTED NOT DETECTED Final   Neisseria meningitidis NOT DETECTED NOT DETECTED Final   Pseudomonas aeruginosa NOT DETECTED NOT DETECTED Final   Candida albicans NOT DETECTED NOT DETECTED Final   Candida glabrata NOT DETECTED NOT DETECTED Final  Candida krusei NOT DETECTED NOT DETECTED Final   Candida parapsilosis NOT DETECTED NOT DETECTED Final   Candida tropicalis NOT DETECTED NOT DETECTED Final    Comment: Performed at [facility], [address] [place], [place] [address]  Urine culture     Status: Abnormal   Collection Time: 12/11/19  8:09 PM   Specimen: In/Out Cath Urine  Result Value Ref Range Status   Specimen Description   Final    IN/OUT CATH URINE Performed at [facility], [address] [place], [place] [address]    Special Requests   Final    NONE Performed at [facility], [address] [place], [place] [address]    Culture MULTIPLE SPECIES PRESENT, SUGGEST RECOLLECTION (A)  Final   Report Status 12/12/2019 FINAL  Final  Respiratory Panel by RT PCR (Flu A&B, Covid) -  Nasopharyngeal Swab     Status: None   Collection Time: 12/11/19  9:17 PM   Specimen: Nasopharyngeal Swab  Result Value Ref Range Status   SARS Coronavirus 2 by RT PCR NEGATIVE NEGATIVE Final    Comment: (NOTE) SARS-CoV-2 target nucleic acids are NOT DETECTED. The SARS-CoV-2 RNA is generally detectable in upper respiratoy specimens during the acute phase of infection. The lowest concentration of SARS-CoV-2 viral copies this assay can detect is 131 copies/mL. A negative result does not preclude SARS-Cov-2 infection and should not be used as the sole basis for treatment or other patient management decisions. A negative result may occur with  improper specimen collection/handling, submission of specimen other than nasopharyngeal swab, presence of viral mutation(s) within the areas targeted by this assay, and inadequate number of viral copies (<131 copies/mL). A negative result must be combined with clinical observations, patient history, and epidemiological information. The expected result is Negative. Fact Sheet for Patients:  PinkCheek.be Fact Sheet for Healthcare Providers:  GravelBags.it This test is not yet ap proved or cleared by the [place] FDA and  has been authorized for detection and/or diagnosis of SARS-CoV-2 by FDA under an Emergency Use Authorization (EUA). This EUA will remain  in effect (meaning this test can be used) for the duration of the COVID-19 declaration under Section 564(b)(1) of the Act, 21 U.S.C. section 360bbb-3(b)(1), unless the authorization is terminated or revoked sooner.    Influenza A by PCR NEGATIVE NEGATIVE Final   Influenza B by PCR NEGATIVE NEGATIVE Final    Comment: (NOTE) The Xpert Xpress SARS-CoV-2/FLU/RSV assay is intended as an aid in  the diagnosis of influenza from Nasopharyngeal swab specimens and  should not be used as a sole basis for treatment. Nasal washings and  aspirates  are unacceptable for Xpert Xpress SARS-CoV-2/FLU/RSV  testing. Fact Sheet for Patients: PinkCheek.be Fact Sheet for Healthcare Providers: GravelBags.it This test is not yet approved or cleared by the [place] FDA and  has been authorized for detection and/or diagnosis of SARS-CoV-2 by  FDA under an Emergency Use Authorization (EUA). This EUA will remain  in effect (meaning this test can be used) for the duration of the  Covid-19 declaration under Section 564(b)(1) of the Act, 21  U.S.C. section 360bbb-3(b)(1), unless the authorization is  terminated or revoked. Performed at [facility], [address] [place],  [address]   MRSA PCR Screening     Status: None   Collection Time: 12/12/19 12:29 AM   Specimen: Nasopharyngeal  Result Value Ref Range Status   MRSA by PCR NEGATIVE NEGATIVE Final    Comment:  The GeneXpert MRSA Assay (FDA approved for NASAL specimens only), is one component of a comprehensive MRSA colonization surveillance program. It is not intended to diagnose MRSA infection nor to guide or monitor treatment for MRSA infections. Performed at [facility] [address] [place], [place] [address]   Aerobic/Anaerobic Culture (surgical/deep wound)     Status: None (Preliminary result)   Collection Time: 12/12/19  2:06 AM   Specimen: Sacral; Wound  Result Value Ref Range Status   Specimen Description   Final    SACRAL Performed at [facility], [address] [place], [place] [address]    Special Requests   Final    NONE Performed at [facility], [address] [place], [place] [address]    Gram Stain   Final    NO WBC SEEN NO ORGANISMS SEEN Performed at [facility], [place] [address], [place], [place] [address]    Culture FEW GRAM NEGATIVE RODS  Final   Report Status PENDING  Incomplete  Aerobic/Anaerobic Culture (surgical/deep  wound)     Status: None (Preliminary result)   Collection Time: 12/12/19  2:06 AM   Specimen: Back; Wound  Result Value Ref Range Status   Specimen Description   Final    BACK LUMBAR SPINE Performed at [facility], [address], [place], [place] [address]    Special Requests   Final    NONE Performed at [facility], [address] [place],  [address]    Gram Stain   Final    NO WBC SEEN NO ORGANISMS SEEN Performed at [facility], [place] [address], [place],  [address]    Culture FEW GRAM NEGATIVE RODS  Final   Report Status PENDING  Incomplete      ASSESSMENT AND PLAN SYNOPSIS   Septic shock due to UTI and acute osteomyelitis Wean off pressors Continue IV abx  END STAGE KIDNEY INJURY/Renal Failure -follow chem 7 HD as required   NEUROLOGY encephalopathy from sepsis   CARDIAC ICU monitoring   INFECTIOUS DISEASE -continue antibiotics as prescribed -follow up cultures -follow up ID consultation   ELECTROLYTES -follow labs as needed -replace as needed -pharmacy consultation and following    DVT/GI PRX ordered TRANSFUSIONS AS NEEDED MONITOR FSBS ASSESS the need for LABS as needed      Critical Care Time devoted to patient care services described in this note is 32  minutes.   Overall, patient is critically ill, prognosis is guarded.  Patient with Multiorgan failure and at high risk for cardiac arrest and death.  VERY POOR PROGNOSIS, VERY POOR CHANCE OF MEANINGFUL RECOVERY, I RECOMMEND HOSPICE AND COMFORT CARE    Kennesha Brewbaker Patricia Pesa, M.D.  [facility]  Medical Director [place] Director [facility] Cardio-Pulmonary Department

## 2019-12-14 NOTE — Progress Notes (Signed)
   Date of Admission:  12/11/2019      Subjective: Pt minimally verbal   Medications:  . budesonide (PULMICORT) nebulizer solution  0.25 mg Nebulization BID  . chlorhexidine  15 mL Mouth Rinse BID  . Chlorhexidine Gluconate Cloth  6 each Topical Q0600  . Chlorhexidine Gluconate Cloth  6 each Topical Q0600  . [START ON 12/15/2019] epoetin (EPOGEN/PROCRIT) injection  10,000 Units Intravenous Q T,Th,Sa-HD  . heparin  5,000 Units Subcutaneous Q8H  . ipratropium-albuterol  3 mL Nebulization Q4H  . mouth rinse  15 mL Mouth Rinse q12n4p  . pantoprazole (PROTONIX) IV  40 mg Intravenous Q24H  . sodium chloride flush  10-40 mL Intracatheter Q12H    Objective: Vital signs in last 24 hours: Temp:  [97.6 F (36.4 C)-98.2 F (36.8 C)] 98 F (36.7 C) (05/12 0800) Pulse Rate:  [72-103] 84 (05/12 1000) Resp:  [6-22] 8 (05/12 1000) BP: (85-126)/(37-63) 103/44 (05/12 1000) SpO2:  [94 %-100 %] 97 % (05/12 1000)  PHYSICAL EXAM:  General: lethargic, fine tremors Head: Normocephalic, without obvious abnormality, atraumatic. Eyes: Conjunctivae clear, anicteric sclerae. Pupils are equal Lungs:b/l air entry Heart: s1s2 Abdomen: Soft, non-tender,not distended. Bowel sounds normal. No masses      Extremities: atraumatic, no cyanosis. No edema. No clubbing Skin: No rashes or lesions. Or bruising Lymph: Cervical, supraclavicular normal. Neurologic: cannot be examined  Lab Results Recent Labs    12/13/19 0853 12/14/19 0600  WBC 13.8* 8.8  HGB 8.0* 6.7*  HCT 26.2* 22.6*  NA 137 134*  K 3.1* 2.9*  CL 107 100  CO2 22 26  BUN 29* 14  CREATININE 2.67* 1.80*   Liver Panel Recent Labs    12/11/19 1945  PROT 5.1*  ALBUMIN 1.5*  AST 30  ALT 23  ALKPHOS 90  BILITOT 0.4    Microbiology:  Studies/Results: No results found.   Assessment/Plan: Impression/Recommendation Septic shock secondary to Proteus bacteremia and ? Wounds.  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  The recent CT abdomen shows 6.2 cm x 4.5 cm area of air and fluid colection seen posterior to the spinal canal at the level of the L4 vertebral body.   The lumbar wound has proteus in the culture The sacral ulcer has citrobacter and ecoli  The collection will have to be drained either by neurosurgery or by IR. Spoke to [place] [facility] who does not recommend any surgical intervention as patient has had multiple surgeries since the past 4 years for the infection. Discussed  with IR. They can drain the fluid.  If aggressive management is to be pursued by family after talking to palliative then IR can drain. Will have to be off heparin  Also recommend removing the permacath after the the collection has been drained.  She has had a permacath for nearly 6 months. Continue cefepime and Flagyl   Encephalopathy due to the above  Sacral stage IV sacral decubitus new with underlying osteomyelitis. Surgery has  debrided the wound.  citrobacter and e.coli so far On cefepime and Flagyl   ?  Discussed the management with the nurse and Dr.Kasa and IR

## 2019-12-15 DIAGNOSIS — Z515 Encounter for palliative care: Secondary | ICD-10-CM

## 2019-12-15 DIAGNOSIS — Z7189 Other specified counseling: Secondary | ICD-10-CM

## 2019-12-15 DIAGNOSIS — L89154 Pressure ulcer of sacral region, stage 4: Secondary | ICD-10-CM

## 2019-12-15 DIAGNOSIS — M869 Osteomyelitis, unspecified: Secondary | ICD-10-CM

## 2019-12-15 LAB — AEROBIC/ANAEROBIC CULTURE W GRAM STAIN (SURGICAL/DEEP WOUND): Gram Stain: NONE SEEN

## 2019-12-15 LAB — CBC WITH DIFFERENTIAL/PLATELET
Abs Immature Granulocytes: 0.22 10*3/uL — ABNORMAL HIGH (ref 0.00–0.07)
Basophils Absolute: 0.1 10*3/uL (ref 0.0–0.1)
Basophils Relative: 1 %
Eosinophils Absolute: 0.1 10*3/uL (ref 0.0–0.5)
Eosinophils Relative: 1 %
HCT: 24.4 % — ABNORMAL LOW (ref 36.0–46.0)
Hemoglobin: 7.3 g/dL — ABNORMAL LOW (ref 12.0–15.0)
Immature Granulocytes: 2 %
Lymphocytes Relative: 8 %
Lymphs Abs: 0.8 10*3/uL (ref 0.7–4.0)
MCH: 26.6 pg (ref 26.0–34.0)
MCHC: 29.9 g/dL — ABNORMAL LOW (ref 30.0–36.0)
MCV: 89.1 fL (ref 80.0–100.0)
Monocytes Absolute: 0.3 10*3/uL (ref 0.1–1.0)
Monocytes Relative: 3 %
Neutro Abs: 8.6 10*3/uL — ABNORMAL HIGH (ref 1.7–7.7)
Neutrophils Relative %: 85 %
Platelets: 73 10*3/uL — ABNORMAL LOW (ref 150–400)
RBC: 2.74 MIL/uL — ABNORMAL LOW (ref 3.87–5.11)
RDW: 21.9 % — ABNORMAL HIGH (ref 11.5–15.5)
Smear Review: NORMAL
WBC: 10.1 10*3/uL (ref 4.0–10.5)
nRBC: 0.2 % (ref 0.0–0.2)

## 2019-12-15 LAB — GLUCOSE, CAPILLARY
Glucose-Capillary: 102 mg/dL — ABNORMAL HIGH (ref 70–99)
Glucose-Capillary: 90 mg/dL (ref 70–99)
Glucose-Capillary: 97 mg/dL (ref 70–99)
Glucose-Capillary: 95 mg/dL (ref 70–99)
Glucose-Capillary: 93 mg/dL (ref 70–99)
Glucose-Capillary: 90 mg/dL (ref 70–99)

## 2019-12-15 LAB — PHOSPHORUS: Phosphorus: 3.5 mg/dL (ref 2.5–4.6)

## 2019-12-15 LAB — BASIC METABOLIC PANEL
Anion gap: 8 (ref 5–15)
BUN: 16 mg/dL (ref 8–23)
CO2: 24 mmol/L (ref 22–32)
Calcium: 7.2 mg/dL — ABNORMAL LOW (ref 8.9–10.3)
Chloride: 99 mmol/L (ref 98–111)
Creatinine, Ser: 2.17 mg/dL — ABNORMAL HIGH (ref 0.44–1.00)
GFR calc Af Amer: 25 mL/min — ABNORMAL LOW (ref >60)
GFR calc non Af Amer: 21 mL/min — ABNORMAL LOW (ref >60)
Glucose, Bld: 106 mg/dL — ABNORMAL HIGH (ref 70–99)
Potassium: 3.4 mmol/L — ABNORMAL LOW (ref 3.5–5.1)
Sodium: 131 mmol/L — ABNORMAL LOW (ref 135–145)

## 2019-12-15 LAB — MAGNESIUM: Magnesium: 1.9 mg/dL (ref 1.7–2.4)

## 2019-12-15 MED ORDER — IPRATROPIUM-ALBUTEROL 0.5-2.5 (3) MG/3ML IN SOLN
3.0000 mL | Freq: Three times a day (TID) | RESPIRATORY_TRACT | Status: DC
  Administered 2019-12-15 – 2019-12-16 (×4): 3 mL via RESPIRATORY_TRACT
  Filled 2019-12-15 (×4): qty 3

## 2019-12-15 MED ORDER — DEXTROSE-NACL 5-0.9 % IV SOLN
INTRAVENOUS | Status: DC
  Administered 2019-12-15: 50 mL/h via INTRAVENOUS

## 2019-12-15 MED ORDER — IPRATROPIUM-ALBUTEROL 0.5-2.5 (3) MG/3ML IN SOLN
3.0000 mL | Freq: Four times a day (QID) | RESPIRATORY_TRACT | Status: DC
  Administered 2019-12-15: 3 mL via RESPIRATORY_TRACT
  Filled 2019-12-15: qty 3

## 2019-12-15 NOTE — Progress Notes (Signed)
Patient had a short run of SVT while this RN was performing mouth care.  Patient was biting down but showed no other signs of distress.

## 2019-12-15 NOTE — Progress Notes (Signed)
Post HD  

## 2019-12-15 NOTE — Progress Notes (Signed)
HD ended 

## 2019-12-15 NOTE — Progress Notes (Signed)
Patient remained stable throughout the shift.  Patient non-verbal, will stare straight ahead, will not track.  Will not follow commands.  Will only bite down during mouth care.  Vital signs remained stable.  At the beginning of shift patient was febrile at 100.5*F, by 1400 patient was down to 98.0*F. Patient repositioned frequently due to severe wounds on her posterior.  Patient currently receiving HD at this time.

## 2019-12-15 NOTE — Progress Notes (Addendum)
Palliative: Mrs. Hornbeck is lying quietly in bed.  She does not respond in any way to voice or touch.  She appears acutely/chronically ill.  She is unable to make her basic needs known.  There is no family at bedside at this time.  Call to son, Evin Chirco.  We talked about the treatment plan including but not limited to hemodialysis today, antibiotics IV fluids with sugar, other medications, sedating medications, lab results including white blood cell trending.   I share that Mrs. Reisch did not respond to me in any meaningful way when I saw her earlier.  [name] states that he is not sure if this is due to medications, but that his mother was "responding perfectly fine" until she came into hospital.  He tells me that family saw her on Sunday.   [name] shares that Mrs. Durante was hospitalized about a 2 weeks prior to this hospitalization.  He tells me that he understands that it will take time for her to recover.   He also tells me that he had his brother know her "chance of recovery is slim".     We talk some what if\'s and maybe\'s.  I share that Mrs. Balz is not eating, and that we do not keep people going with IV fluids.   I mentioned PEG tube, artificial feeding.  I share that tube feedings fundamentally does not change things for Mrs. Seier.  I shared that we must eat to live, but, the medical team would not recommend a tube to feed her.   We talk about CODE STATUS, "treat the treatable but allowing natural death".  [name] states that he will "finalize" this decision with his brother tonight.  Purportedly Mrs. Kliethermes has a living will that [name] was to retrieve from the attorney\'s office this week.  Unfortunately, this NP did not ask him what her living will specified.  It would be very important and helpful to know the contents of this document.  [name] states that he and Alvin have no sisters, they were going to speak to Mrs. Alvizo\'s sister\'s about her condition.  He shares that they are not to be  on the list to receive information about her status.   Conference with attending, bedside nursing staff, transition of care team, palliative team member related to patient condition, needs, goals of care discussion.  Plan:   Continue to treat the treatable.  Full scope/code.   PMT to follow.      40  minutes Quinn Axe, NP Palliative Medicine Team Team Phone # [phone] Greater than 50% of this time was spent counseling and coordinating care related to the above assessment and plan.

## 2019-12-15 NOTE — Progress Notes (Signed)
HD started. 

## 2019-12-15 NOTE — Progress Notes (Signed)
Pre HD  

## 2019-12-15 NOTE — Progress Notes (Signed)
CRITICAL CARE NOTE 79 yo female with ESRD on HD admitted with acute toxic metabolic encephalopathy secondary to severe sepsis in the setting of UTI, chronic nonhealing sacral decubitus ulcer CT suggestive of osteomyelitis along with L4 vertebral body postoperative collection/abscess requiring levophed gtt   SIGNIFICANT EVENTS/STUDIES:  05/9: Pt presented to [facility] ER minimally responsive and hypoxic.   05/9: Pt admitted to ICU with septic shock requiring levophed gtt with improvement in mentation and hypoxia resolved 05/9: CT Head revealed no acute intracranial abnormality. Mild to moderate severity cerebral atrophy and microvascular disease changes of the supratentorial brain. Mild sphenoid sinus disease. 05/9: CT Abd/Pelvis revealed extensive postoperative changes within the lumbar spine, as described above, with a 6.2 cm x 4.5 cm area of air and fluid attenuation seen posterior to the spinal canal at the level of the L4 vertebral body. This may represent a postoperative collection/abscess. Large, ventral hernias, as described above. Sacral decubitus ulcer with additional findings suggestive of acute osteomyelitis. Stable 6 mm nonobstructing renal stone within the left kidney. Sigmoid diverticulosis.  5/10 remains on pressors, slight increased WOB, lethargic 5/13-5/14 off vasopressors, palliative has met with family Lethargic, unable to provide ROS  CC  Follow up septic shock   HPI Off all pressors HD as needed Not on vent Very poor prognosis    BP 125/69   Pulse 88   Temp 97.9 F (36.6 C) (Axillary)   Resp 12   Ht '5\' 6"'  (1.676 m)   Wt 79 kg   SpO2 99%   BMI 28.11 kg/m    I/O last 3 completed shifts: In: 2605.9 [I.V.:1395.9; IV Piggyback:1210] Out: 0  No intake/output data recorded.  SpO2: 99 % O2 Flow Rate (L/min): 1 L/min  Estimated body mass index is 28.11 kg/m as calculated from the following:   Height as of this encounter: '5\' 6"'  (1.676 m).   Weight as of this  encounter: 79 kg.  REVIEW OF SYSTEMS  PATIENT IS UNABLE TO PROVIDE COMPLETE REVIEW OF SYSTEM S DUE TO ENCEPHALOPATHY     Pressure Injury 11/19/19 Sacrum Mid;Right Unstageable - Full thickness tissue loss in which the base of the injury is covered by slough (yellow, tan, gray, green or brown) and/or eschar (tan, brown or black) in the wound bed. deep wound with bed covered  (Active)  11/19/19 1800  Location: Sacrum  Location Orientation: Mid;Right  Staging: Unstageable - Full thickness tissue loss in which the base of the injury is covered by slough (yellow, tan, gray, green or brown) and/or eschar (tan, brown or black) in the wound bed.  Wound Description (Comments): deep wound with bed covered by slough  Present on Admission: Yes     Pressure Injury 11/19/19 Heel Left Deep Tissue Pressure Injury - Purple or maroon localized area of discolored intact skin or blood-filled blister due to damage of underlying soft tissue from pressure and/or shear. (Active)  11/19/19 1800  Location: Heel  Location Orientation: Left  Staging: Deep Tissue Pressure Injury - Purple or maroon localized area of discolored intact skin or blood-filled blister due to damage of underlying soft tissue from pressure and/or shear.  Wound Description (Comments):   Present on Admission: Yes    Physical Examination:   General Appearance: No distress  Neuro:lethargic HEENT: PERRLA, EOM intact.   Pulmonary: normal breath sounds, No wheezing.  CardiovascularNormal S1,S2.  No m/r/g.   Abdomen: Benign, Soft, non-tender. Renal:  No costovertebral tenderness  GU:  Not performed at this time. Endoc: No evident thyromegaly  LARGE SACRAL DECUB PSYCHIATRIC: poor insight   ALL OTHER ROS ARE NEGATIVE   MEDICATIONS: I have reviewed all medications and confirmed regimen as documented   CULTURE RESULTS   Recent Results (from the past 240 hour(s))  Blood Culture (routine x 2)     Status: Abnormal (Preliminary result)    Collection Time: 12/11/19  7:45 PM   Specimen: BLOOD  Result Value Ref Range Status   Specimen Description   Final    BLOOD RIGHT FA Performed at [facility], [address] [place], [place] [address]    Special Requests   Final    BOTTLES DRAWN AEROBIC AND ANAEROBIC Blood Culture adequate volume Performed at [facility], [address] [place], [place] [address]    Culture  Setup Time   Final    GRAM NEGATIVE RODS IN BOTH AEROBIC AND ANAEROBIC BOTTLES CRITICAL RESULT CALLED TO, READ BACK BY AND VERIFIED WITHRayna Sexton AT 5465 ON 12/12/19 SNG Performed at [facility], [place] [address] [place], [place] [address]    Culture PROTEUS MIRABILIS CITROBACTER YOUNGAE  (A)  Final   Report Status PENDING  Incomplete  Blood Culture (routine x 2)     Status: Abnormal (Preliminary result)   Collection Time: 12/11/19  7:45 PM   Specimen: BLOOD  Result Value Ref Range Status   Specimen Description   Final    BLOOD RIGHT ARM Performed at [facility], [address] [place], [place] [address]    Special Requests   Final    BOTTLES DRAWN AEROBIC AND ANAEROBIC Blood Culture adequate volume Performed at [facility], [address] [place], [place] [address]    Culture  Setup Time   Final    GRAM NEGATIVE RODS IN BOTH AEROBIC AND ANAEROBIC BOTTLES CRITICAL VALUE NOTED.  VALUE IS CONSISTENT WITH PREVIOUSLY REPORTED AND CALLED VALUE. Performed at [facility], [address] [place], [place] [address]    Culture (A)  Final    PROTEUS MIRABILIS CITROBACTER YOUNGAE SUSCEPTIBILITIES TO FOLLOW Performed at [facility], [place] [address] [place], [place] [address]    Report Status PENDING  Incomplete   Organism ID, Bacteria PROTEUS MIRABILIS  Final      Susceptibility   Proteus mirabilis - MIC*    AMPICILLIN <=2 SENSITIVE Sensitive     CEFAZOLIN <=4 SENSITIVE Sensitive     CEFEPIME <=1 SENSITIVE Sensitive     CEFTAZIDIME  <=1 SENSITIVE Sensitive     CEFTRIAXONE <=1 SENSITIVE Sensitive     CIPROFLOXACIN <=0.25 SENSITIVE Sensitive     GENTAMICIN <=1 SENSITIVE Sensitive     IMIPENEM 4 SENSITIVE Sensitive     TRIMETH/SULFA <=20 SENSITIVE Sensitive     AMPICILLIN/SULBACTAM <=2 SENSITIVE Sensitive     PIP/TAZO <=4 SENSITIVE Sensitive     * PROTEUS MIRABILIS  Blood Culture ID Panel (Reflexed)     Status: Abnormal   Collection Time: 12/11/19  7:45 PM  Result Value Ref Range Status   Enterococcus species NOT DETECTED NOT DETECTED Final   Listeria monocytogenes NOT DETECTED NOT DETECTED Final   Staphylococcus species NOT DETECTED NOT DETECTED Final   Staphylococcus aureus (BCID) NOT DETECTED NOT DETECTED Final   Streptococcus species NOT DETECTED NOT DETECTED Final   Streptococcus agalactiae NOT DETECTED NOT DETECTED Final   Streptococcus pneumoniae NOT DETECTED NOT DETECTED Final   Streptococcus pyogenes NOT DETECTED NOT DETECTED Final   Acinetobacter baumannii NOT DETECTED NOT DETECTED Final  Enterobacteriaceae species DETECTED (A) NOT DETECTED Final    Comment: Enterobacteriaceae represent a large family of gram-negative bacteria, not a single organism. CRITICAL RESULT CALLED TO, READ BACK BY AND VERIFIED WITH: Rayna Sexton AT 1914 ON 12/12/19 SNG    Enterobacter cloacae complex NOT DETECTED NOT DETECTED Final   Escherichia coli NOT DETECTED NOT DETECTED Final   Klebsiella oxytoca NOT DETECTED NOT DETECTED Final   Klebsiella pneumoniae NOT DETECTED NOT DETECTED Final   Proteus species DETECTED (A) NOT DETECTED Final    Comment: CRITICAL RESULT CALLED TO, READ BACK BY AND VERIFIED WITH: Rayna Sexton AT 7829 ON 12/12/19 SNG    Serratia marcescens NOT DETECTED NOT DETECTED Final   Carbapenem resistance NOT DETECTED NOT DETECTED Final   Haemophilus influenzae NOT DETECTED NOT DETECTED Final   Neisseria meningitidis NOT DETECTED NOT DETECTED Final   Pseudomonas aeruginosa NOT DETECTED NOT DETECTED Final    Candida albicans NOT DETECTED NOT DETECTED Final   Candida glabrata NOT DETECTED NOT DETECTED Final   Candida krusei NOT DETECTED NOT DETECTED Final   Candida parapsilosis NOT DETECTED NOT DETECTED Final   Candida tropicalis NOT DETECTED NOT DETECTED Final    Comment: Performed at [facility], [address], [place], [place] [address]  Urine culture     Status: Abnormal   Collection Time: 12/11/19  8:09 PM   Specimen: In/Out Cath Urine  Result Value Ref Range Status   Specimen Description   Final    IN/OUT CATH URINE Performed at [facility], [address], [place], [place] [address]    Special Requests   Final    NONE Performed at [facility], [address] [place], [place] [address]    Culture MULTIPLE SPECIES PRESENT, SUGGEST RECOLLECTION (A)  Final   Report Status 12/12/2019 FINAL  Final  Respiratory Panel by RT PCR (Flu A&B, Covid) - Nasopharyngeal Swab     Status: None   Collection Time: 12/11/19  9:17 PM   Specimen: Nasopharyngeal Swab  Result Value Ref Range Status   SARS Coronavirus 2 by RT PCR NEGATIVE NEGATIVE Final    Comment: (NOTE) SARS-CoV-2 target nucleic acids are NOT DETECTED. The SARS-CoV-2 RNA is generally detectable in upper respiratoy specimens during the acute phase of infection. The lowest concentration of SARS-CoV-2 viral copies this assay can detect is 131 copies/mL. A negative result does not preclude SARS-Cov-2 infection and should not be used as the sole basis for treatment or other patient management decisions. A negative result may occur with  improper specimen collection/handling, submission of specimen other than nasopharyngeal swab, presence of viral mutation(s) within the areas targeted by this assay, and inadequate number of viral copies (<131 copies/mL). A negative result must be combined with clinical observations, patient history, and epidemiological information. The expected result is Negative. Fact  Sheet for Patients:  PinkCheek.be Fact Sheet for Healthcare Providers:  GravelBags.it This test is not yet ap proved or cleared by the [place] FDA and  has been authorized for detection and/or diagnosis of SARS-CoV-2 by FDA under an Emergency Use Authorization (EUA). This EUA will remain  in effect (meaning this test can be used) for the duration of the COVID-19 declaration under Section 564(b)(1) of the Act, 21 U.S.C. section 360bbb-3(b)(1), unless the authorization is terminated or revoked sooner.    Influenza A by PCR NEGATIVE NEGATIVE Final   Influenza B by PCR NEGATIVE NEGATIVE Final    Comment: (NOTE) The Xpert Xpress SARS-CoV-2/FLU/RSV assay is intended as  an aid in  the diagnosis of influenza from Nasopharyngeal swab specimens and  should not be used as a sole basis for treatment. Nasal washings and  aspirates are unacceptable for Xpert Xpress SARS-CoV-2/FLU/RSV  testing. Fact Sheet for Patients: PinkCheek.be Fact Sheet for Healthcare Providers: GravelBags.it This test is not yet approved or cleared by the [place] FDA and  has been authorized for detection and/or diagnosis of SARS-CoV-2 by  FDA under an Emergency Use Authorization (EUA). This EUA will remain  in effect (meaning this test can be used) for the duration of the  Covid-19 declaration under Section 564(b)(1) of the Act, 21  U.S.C. section 360bbb-3(b)(1), unless the authorization is  terminated or revoked. Performed at [facility], [address] [place], [place] [address]   MRSA PCR Screening     Status: None   Collection Time: 12/12/19 12:29 AM   Specimen: Nasopharyngeal  Result Value Ref Range Status   MRSA by PCR NEGATIVE NEGATIVE Final    Comment:        The GeneXpert MRSA Assay (FDA approved for NASAL specimens only), is one component of a comprehensive MRSA  colonization surveillance program. It is not intended to diagnose MRSA infection nor to guide or monitor treatment for MRSA infections. Performed at [facility], [address] [place], [place] [address]   Aerobic/Anaerobic Culture (surgical/deep wound)     Status: None (Preliminary result)   Collection Time: 12/12/19  2:06 AM   Specimen: Sacral; Wound  Result Value Ref Range Status   Specimen Description   Final    SACRAL Performed at [facility], [address], [place], [place] [address]    Special Requests   Final    NONE Performed at [facility], [address] [place], [place] [address]    Gram Stain   Final    NO WBC SEEN NO ORGANISMS SEEN Performed at [facility], [place] [address], [place], [place] [address]    Culture   Final    FEW ESCHERICHIA COLI FEW CITROBACTER SPECIES FEW PROTEUS MIRABILIS    Report Status PENDING  Incomplete   Organism ID, Bacteria ESCHERICHIA COLI  Final   Organism ID, Bacteria CITROBACTER SPECIES  Final      Susceptibility   Citrobacter species - MIC*    CEFAZOLIN >=64 RESISTANT Resistant     CEFEPIME <=1 SENSITIVE Sensitive     CEFTAZIDIME >=64 RESISTANT Resistant     CEFTRIAXONE >=64 RESISTANT Resistant     CIPROFLOXACIN <=0.25 SENSITIVE Sensitive     GENTAMICIN <=1 SENSITIVE Sensitive     IMIPENEM <=0.25 SENSITIVE Sensitive     TRIMETH/SULFA <=20 SENSITIVE Sensitive     PIP/TAZO 64 INTERMEDIATE Intermediate     * FEW CITROBACTER SPECIES   Escherichia coli - MIC*    AMPICILLIN 16 INTERMEDIATE Intermediate     CEFAZOLIN >=64 RESISTANT Resistant     CEFEPIME <=1 SENSITIVE Sensitive     CEFTAZIDIME <=1 SENSITIVE Sensitive     CEFTRIAXONE <=1 SENSITIVE Sensitive     CIPROFLOXACIN <=0.25 SENSITIVE Sensitive     GENTAMICIN <=1 SENSITIVE Sensitive     IMIPENEM <=0.25 SENSITIVE Sensitive     TRIMETH/SULFA <=20 SENSITIVE Sensitive     AMPICILLIN/SULBACTAM 4 SENSITIVE Sensitive     PIP/TAZO <=4  SENSITIVE Sensitive     * FEW ESCHERICHIA COLI  Aerobic/Anaerobic Culture (surgical/deep wound)     Status: None (Preliminary result)   Collection Time: 12/12/19  2:06 AM  Specimen: Back; Wound  Result Value Ref Range Status   Specimen Description   Final    BACK LUMBAR SPINE Performed at [facility], [address] [place], [place] [address]    Special Requests   Final    NONE Performed at [facility], [address] [place], [place] [address]    Gram Stain   Final    NO WBC SEEN NO ORGANISMS SEEN Performed at [facility], [place] [address], [place], [place] [address]    Culture   Final    FEW PROTEUS MIRABILIS NO ANAEROBES ISOLATED; CULTURE IN PROGRESS FOR 5 DAYS    Report Status PENDING  Incomplete   Organism ID, Bacteria PROTEUS MIRABILIS  Final      Susceptibility   Proteus mirabilis - MIC*    AMPICILLIN <=2 SENSITIVE Sensitive     CEFAZOLIN <=4 SENSITIVE Sensitive     CEFEPIME <=1 SENSITIVE Sensitive     CEFTAZIDIME <=1 SENSITIVE Sensitive     CEFTRIAXONE <=1 SENSITIVE Sensitive     CIPROFLOXACIN <=0.25 SENSITIVE Sensitive     GENTAMICIN 2 SENSITIVE Sensitive     IMIPENEM 4 SENSITIVE Sensitive     TRIMETH/SULFA <=20 SENSITIVE Sensitive     AMPICILLIN/SULBACTAM <=2 SENSITIVE Sensitive     PIP/TAZO <=4 SENSITIVE Sensitive     * FEW PROTEUS MIRABILIS      ASSESSMENT AND PLAN SYNOPSIS  SEPTIC SHOCK DUE TO UTI AND ACUTE OSTEOMYELITIS OFF PRESSORS CONTINUE IV ABX PROTEUS SPECIES  INFECTIOUS DISEASE -continue antibiotics as prescribed -follow up cultures -follow up ID consultation   END STAGE KIDNEY INJURY/Renal Failure -follow chem 7 HD as required   NEUROLOGY Encephalopathy from sepsis and      ELECTROLYTES -follow labs as needed -replace as needed -pharmacy consultation and following    DVT/GI PRX ordered TRANSFUSIONS AS NEEDED MONITOR FSBS ASSESS the need for LABS as needed   VERY POOR PROGNOSIS, VERY  POOR CHANCE OF MEANINGFUL RECOVERY, I RECOMMEND [place], M.D.  [facility]  Medical Director [place] Director [place] Department

## 2019-12-15 NOTE — Progress Notes (Signed)
Pt scored an 8 on the RT protocol assessment. Her BS have been clear and diminished. She is not requiring 02. The nebulizer treatments are being changed to TID.

## 2019-12-15 NOTE — Progress Notes (Signed)
RENNY GUNNARSON  MRN: [id number]  DOB/AGE: 1940/11/05 79 y.o.  Primary Care Physician:McLean-Scocuzza, Nino Glow, MD  Admit date: 12/11/2019  Chief Complaint:  Chief Complaint  Patient presents with  . Code Sepsis    S-Pt presented on  12/11/2019 with  Chief Complaint  Patient presents with  . Code Sepsis  . Patient is lying comfortably on the bed. Patient is lethargic unable to offer any complaints  Medications . budesonide (PULMICORT) nebulizer solution  0.25 mg Nebulization BID  . chlorhexidine  15 mL Mouth Rinse BID  . Chlorhexidine Gluconate Cloth  6 each Topical Q0600  . epoetin (EPOGEN/PROCRIT) injection  10,000 Units Intravenous Q T,Th,Sa-HD  . heparin  5,000 Units Subcutaneous Q8H  . ipratropium-albuterol  3 mL Nebulization QID  . mouth rinse  15 mL Mouth Rinse q12n4p  . pantoprazole (PROTONIX) IV  40 mg Intravenous Q24H  . sodium chloride flush  10-40 mL Intracatheter Q12H         ROS: Unable to offer any complaints   Physical Exam: Vital signs in last 24 hours: Temp:  [97.9 F (36.6 C)-100.5 F (38.1 C)] 100.5 F (38.1 C) (05/13 0800) Pulse Rate:  [76-102] 90 (05/13 1100) Resp:  [8-16] 10 (05/13 1100) BP: (99-145)/(44-84) 124/54 (05/13 1100) SpO2:  [95 %-100 %] 98 % (05/13 1100) Weight:  [79 kg] 79 kg (05/13 0119) Weight change:  Last BM Date: 12/12/19  Intake/Output from previous day: 05/12 0701 - 05/13 0700 In: 2208.4 [I.V.:1198.4; IV Piggyback:1010] Out: 0  Total I/O In: 544 [I.V.:444; IV Piggyback:100] Out: -    Physical Exam: General-patient is critically ill appearing, lethargic Resp- No acute REsp distress,  NO Rhonchi CVS- S1S2 regular in rate and rhythm GIT- BS+, soft, NT, ND EXT- NO LE Edema, Cyanosis Access patient has right-sided tunneled catheter in situ-IJ   Lab Results: CBC Recent Labs    12/14/19 0600 12/15/19 0414  WBC 8.8 10.1  HGB 6.7* 7.3*  HCT 22.6* 24.4*  PLT 82* 73*    BMET Recent Labs    12/14/19 0600  12/15/19 0414  NA 134* 131*  K 2.9* 3.4*  CL 100 99  CO2 26 24  GLUCOSE 77 106*  BUN 14 16  CREATININE 1.80* 2.17*  CALCIUM 7.2* 7.2*    MICRO Recent Results (from the past 240 hour(s))  Blood Culture (routine x 2)     Status: Abnormal (Preliminary result)   Collection Time: 12/11/19  7:45 PM   Specimen: BLOOD  Result Value Ref Range Status   Specimen Description   Final    BLOOD RIGHT FA Performed at [facility], [address] [place], [place] [address]    Special Requests   Final    BOTTLES DRAWN AEROBIC AND ANAEROBIC Blood Culture adequate volume Performed at [facility], [address] [place], [place] [address]    Culture  Setup Time   Final    GRAM NEGATIVE RODS IN BOTH AEROBIC AND ANAEROBIC BOTTLES CRITICAL RESULT CALLED TO, READ BACK BY AND VERIFIED WITHRayna Sexton AT 5643 ON 12/12/19 SNG Performed at [facility], [place] [address] [place], [place] [address]    Culture PROTEUS MIRABILIS CITROBACTER YOUNGAE  (A)  Final   Report Status PENDING  Incomplete  Blood Culture (routine x 2)     Status: Abnormal (Preliminary result)   Collection Time: 12/11/19  7:45 PM   Specimen: BLOOD  Result Value Ref Range Status   Specimen Description   Final  BLOOD RIGHT ARM Performed at [facility], [address] [place], [place] [address]    Special Requests   Final    BOTTLES DRAWN AEROBIC AND ANAEROBIC Blood Culture adequate volume Performed at [facility], [address] [place], [place] [address]    Culture  Setup Time   Final    GRAM NEGATIVE RODS IN BOTH AEROBIC AND ANAEROBIC BOTTLES CRITICAL VALUE NOTED.  VALUE IS CONSISTENT WITH PREVIOUSLY REPORTED AND CALLED VALUE. Performed at [facility], [address], [place], [place] [address]    Culture (A)  Final    PROTEUS MIRABILIS CITROBACTER YOUNGAE REPEATING SUSCEPTIBILTIES Performed at [facility], [place] [address], [place], [place]  [address]    Report Status PENDING  Incomplete   Organism ID, Bacteria PROTEUS MIRABILIS  Final      Susceptibility   Proteus mirabilis - MIC*    AMPICILLIN <=2 SENSITIVE Sensitive     CEFAZOLIN <=4 SENSITIVE Sensitive     CEFEPIME <=1 SENSITIVE Sensitive     CEFTAZIDIME <=1 SENSITIVE Sensitive     CEFTRIAXONE <=1 SENSITIVE Sensitive     CIPROFLOXACIN <=0.25 SENSITIVE Sensitive     GENTAMICIN <=1 SENSITIVE Sensitive     IMIPENEM 4 SENSITIVE Sensitive     TRIMETH/SULFA <=20 SENSITIVE Sensitive     AMPICILLIN/SULBACTAM <=2 SENSITIVE Sensitive     PIP/TAZO <=4 SENSITIVE Sensitive     * PROTEUS MIRABILIS  Blood Culture ID Panel (Reflexed)     Status: Abnormal   Collection Time: 12/11/19  7:45 PM  Result Value Ref Range Status   Enterococcus species NOT DETECTED NOT DETECTED Final   Listeria monocytogenes NOT DETECTED NOT DETECTED Final   Staphylococcus species NOT DETECTED NOT DETECTED Final   Staphylococcus aureus (BCID) NOT DETECTED NOT DETECTED Final   Streptococcus species NOT DETECTED NOT DETECTED Final   Streptococcus agalactiae NOT DETECTED NOT DETECTED Final   Streptococcus pneumoniae NOT DETECTED NOT DETECTED Final   Streptococcus pyogenes NOT DETECTED NOT DETECTED Final   Acinetobacter baumannii NOT DETECTED NOT DETECTED Final   Enterobacteriaceae species DETECTED (A) NOT DETECTED Final    Comment: Enterobacteriaceae represent a large family of gram-negative bacteria, not a single organism. CRITICAL RESULT CALLED TO, READ BACK BY AND VERIFIED WITH: Rayna Sexton AT 6644 ON 12/12/19 SNG    Enterobacter cloacae complex NOT DETECTED NOT DETECTED Final   Escherichia coli NOT DETECTED NOT DETECTED Final   Klebsiella oxytoca NOT DETECTED NOT DETECTED Final   Klebsiella pneumoniae NOT DETECTED NOT DETECTED Final   Proteus species DETECTED (A) NOT DETECTED Final    Comment: CRITICAL RESULT CALLED TO, READ BACK BY AND VERIFIED WITH: Rayna Sexton AT 0347 ON 12/12/19 SNG    Serratia  marcescens NOT DETECTED NOT DETECTED Final   Carbapenem resistance NOT DETECTED NOT DETECTED Final   Haemophilus influenzae NOT DETECTED NOT DETECTED Final   Neisseria meningitidis NOT DETECTED NOT DETECTED Final   Pseudomonas aeruginosa NOT DETECTED NOT DETECTED Final   Candida albicans NOT DETECTED NOT DETECTED Final   Candida glabrata NOT DETECTED NOT DETECTED Final   Candida krusei NOT DETECTED NOT DETECTED Final   Candida parapsilosis NOT DETECTED NOT DETECTED Final   Candida tropicalis NOT DETECTED NOT DETECTED Final    Comment: Performed at [facility], [address] [place], [place] [address]  Urine culture     Status: Abnormal   Collection Time: 12/11/19  8:09 PM   Specimen: In/Out Cath Urine  Result Value Ref  Range Status   Specimen Description   Final    IN/OUT CATH URINE Performed at [facility], [address] [place], [place] [address]    Special Requests   Final    NONE Performed at [facility], [address] [place], [place] [address]    Culture MULTIPLE SPECIES PRESENT, SUGGEST RECOLLECTION (A)  Final   Report Status 12/12/2019 FINAL  Final  Respiratory Panel by RT PCR (Flu A&B, Covid) - Nasopharyngeal Swab     Status: None   Collection Time: 12/11/19  9:17 PM   Specimen: Nasopharyngeal Swab  Result Value Ref Range Status   SARS Coronavirus 2 by RT PCR NEGATIVE NEGATIVE Final    Comment: (NOTE) SARS-CoV-2 target nucleic acids are NOT DETECTED. The SARS-CoV-2 RNA is generally detectable in upper respiratoy specimens during the acute phase of infection. The lowest concentration of SARS-CoV-2 viral copies this assay can detect is 131 copies/mL. A negative result does not preclude SARS-Cov-2 infection and should not be used as the sole basis for treatment or other patient management decisions. A negative result may occur with  improper specimen collection/handling, submission of specimen other than nasopharyngeal swab, presence  of viral mutation(s) within the areas targeted by this assay, and inadequate number of viral copies (<131 copies/mL). A negative result must be combined with clinical observations, patient history, and epidemiological information. The expected result is Negative. Fact Sheet for Patients:  PinkCheek.be Fact Sheet for Healthcare Providers:  GravelBags.it This test is not yet ap proved or cleared by the [place] FDA and  has been authorized for detection and/or diagnosis of SARS-CoV-2 by FDA under an Emergency Use Authorization (EUA). This EUA will remain  in effect (meaning this test can be used) for the duration of the COVID-19 declaration under Section 564(b)(1) of the Act, 21 U.S.C. section 360bbb-3(b)(1), unless the authorization is terminated or revoked sooner.    Influenza A by PCR NEGATIVE NEGATIVE Final   Influenza B by PCR NEGATIVE NEGATIVE Final    Comment: (NOTE) The Xpert Xpress SARS-CoV-2/FLU/RSV assay is intended as an aid in  the diagnosis of influenza from Nasopharyngeal swab specimens and  should not be used as a sole basis for treatment. Nasal washings and  aspirates are unacceptable for Xpert Xpress SARS-CoV-2/FLU/RSV  testing. Fact Sheet for Patients: PinkCheek.be Fact Sheet for Healthcare Providers: GravelBags.it This test is not yet approved or cleared by the [place] FDA and  has been authorized for detection and/or diagnosis of SARS-CoV-2 by  FDA under an Emergency Use Authorization (EUA). This EUA will remain  in effect (meaning this test can be used) for the duration of the  Covid-19 declaration under Section 564(b)(1) of the Act, 21  U.S.C. section 360bbb-3(b)(1), unless the authorization is  terminated or revoked. Performed at [facility], [address] [place],  [address]   MRSA PCR Screening     Status: None    Collection Time: 12/12/19 12:29 AM   Specimen: Nasopharyngeal  Result Value Ref Range Status   MRSA by PCR NEGATIVE NEGATIVE Final    Comment:        The GeneXpert MRSA Assay (FDA approved for NASAL specimens only), is one component of a comprehensive MRSA colonization surveillance program. It is not intended to diagnose MRSA infection nor to guide or monitor treatment for MRSA infections. Performed at [facility], [address] [place],  [address]   Aerobic/Anaerobic Culture (surgical/deep wound)     Status: None (Preliminary  result)   Collection Time: 12/12/19  2:06 AM   Specimen: Sacral; Wound  Result Value Ref Range Status   Specimen Description   Final    SACRAL Performed at [facility], [address], [place], [place] [address]    Special Requests   Final    NONE Performed at [facility], [place], [place] [address]    Gram Stain NO WBC SEEN NO ORGANISMS SEEN   Final   Culture   Final    FEW ESCHERICHIA COLI FEW CITROBACTER SPECIES FEW PROTEUS MIRABILIS HOLDING FOR POSSIBLE ANAEROBE Performed at [facility], [place] [address] [place], [place] [address]    Report Status PENDING  Incomplete   Organism ID, Bacteria ESCHERICHIA COLI  Final   Organism ID, Bacteria CITROBACTER SPECIES  Final   Organism ID, Bacteria PROTEUS MIRABILIS  Final      Susceptibility   Citrobacter species - MIC*    CEFAZOLIN >=64 RESISTANT Resistant     CEFEPIME <=1 SENSITIVE Sensitive     CEFTAZIDIME >=64 RESISTANT Resistant     CEFTRIAXONE >=64 RESISTANT Resistant     CIPROFLOXACIN <=0.25 SENSITIVE Sensitive     GENTAMICIN <=1 SENSITIVE Sensitive     IMIPENEM <=0.25 SENSITIVE Sensitive     TRIMETH/SULFA <=20 SENSITIVE Sensitive     PIP/TAZO 64 INTERMEDIATE Intermediate     * FEW CITROBACTER SPECIES   Escherichia coli - MIC*    AMPICILLIN 16 INTERMEDIATE Intermediate     CEFAZOLIN >=64 RESISTANT Resistant     CEFEPIME  <=1 SENSITIVE Sensitive     CEFTAZIDIME <=1 SENSITIVE Sensitive     CEFTRIAXONE <=1 SENSITIVE Sensitive     CIPROFLOXACIN <=0.25 SENSITIVE Sensitive     GENTAMICIN <=1 SENSITIVE Sensitive     IMIPENEM <=0.25 SENSITIVE Sensitive     TRIMETH/SULFA <=20 SENSITIVE Sensitive     AMPICILLIN/SULBACTAM 4 SENSITIVE Sensitive     PIP/TAZO <=4 SENSITIVE Sensitive     * FEW ESCHERICHIA COLI   Proteus mirabilis - MIC*    AMPICILLIN <=2 SENSITIVE Sensitive     CEFAZOLIN <=4 SENSITIVE Sensitive     CEFEPIME <=1 SENSITIVE Sensitive     CEFTAZIDIME <=1 SENSITIVE Sensitive     CEFTRIAXONE <=1 SENSITIVE Sensitive     CIPROFLOXACIN <=0.25 SENSITIVE Sensitive     GENTAMICIN 2 SENSITIVE Sensitive     IMIPENEM 4 SENSITIVE Sensitive     TRIMETH/SULFA <=20 SENSITIVE Sensitive     AMPICILLIN/SULBACTAM <=2 SENSITIVE Sensitive     PIP/TAZO <=4 SENSITIVE Sensitive     * FEW PROTEUS MIRABILIS  Aerobic/Anaerobic Culture (surgical/deep wound)     Status: None (Preliminary result)   Collection Time: 12/12/19  2:06 AM   Specimen: Back; Wound  Result Value Ref Range Status   Specimen Description   Final    BACK LUMBAR SPINE Performed at [facility], [address], [place], [place] [address]    Special Requests   Final    NONE Performed at [facility], [address] [place], [place] [address]    Gram Stain   Final    NO WBC SEEN NO ORGANISMS SEEN Performed at [facility], [place] [address], [place], [place] [address]    Culture   Final    FEW PROTEUS MIRABILIS NO ANAEROBES ISOLATED; CULTURE IN PROGRESS FOR 5 DAYS    Report Status PENDING  Incomplete   Organism ID, Bacteria PROTEUS MIRABILIS  Final  Susceptibility   Proteus mirabilis - MIC*    AMPICILLIN <=2 SENSITIVE Sensitive     CEFAZOLIN <=4 SENSITIVE Sensitive     CEFEPIME <=1 SENSITIVE Sensitive     CEFTAZIDIME <=1 SENSITIVE Sensitive     CEFTRIAXONE <=1 SENSITIVE Sensitive     CIPROFLOXACIN <=0.25 SENSITIVE  Sensitive     GENTAMICIN 2 SENSITIVE Sensitive     IMIPENEM 4 SENSITIVE Sensitive     TRIMETH/SULFA <=20 SENSITIVE Sensitive     AMPICILLIN/SULBACTAM <=2 SENSITIVE Sensitive     PIP/TAZO <=4 SENSITIVE Sensitive     * FEW PROTEUS MIRABILIS      Lab Results  Component Value Date   PTH 68 (H) 12/13/2019   CALCIUM 7.2 (L) 12/15/2019   CAION 1.11 (L) 06/23/2019   PHOS 3.5 12/15/2019               Impression:   Patient is a 79 year old African-American female with a past medical history of end-stage renal disease on hemodialysis-Tuesday Thursday Saturday schedule, dementia, diabetes mellitus type 2, hypertension, hypothyroidism who was admitted to the hospital with chief complaint of altered mental status and sepsis  1)Renal end-stage renal disease on hemodialysis. Patient is on Tuesday Thursday Saturday schedule as an outpatient Patient is an outpatient is under the care of [place] [facility] [place] location. Patient was last dialyzed on Tuesday. Patient will be dialyzed today    2) hypotension Patient was earlier on pressors Patient is now off pressors Patient blood pressure is stable  3)Anemia of chronic disease  HGb is not at goal (9--11) Patient is on 10,000 units per treatment of Epogen  4) secondary hyperparathyroidism -CKD Mineral-Bone Disorder   Secondary Hyperparathyroidism present. Phosphorus at goal.   5) sepsis Patient is clinically better Patient is on cefepime/Flagyl   6) electrolytes   sodium Hyponatremia-secondary to ESRD   potassium Hypokalemia-we will use 3K bath    7)Acid base Co2 at goal     Plan:   We will dialyze patient today We will use low blood flow rate as patient was recently on pressors We will use 3K bath We will keep patient on Epogen    Lyndsy Gilberto s [facility] 12/15/2019, 11:21 AM

## 2019-12-16 ENCOUNTER — Inpatient Hospital Stay: Payer: Medicare Other

## 2019-12-16 LAB — CBC WITH DIFFERENTIAL/PLATELET
Abs Immature Granulocytes: 0.17 10*3/uL — ABNORMAL HIGH (ref 0.00–0.07)
Basophils Absolute: 0 10*3/uL (ref 0.0–0.1)
Basophils Relative: 0 %
Eosinophils Absolute: 0.1 10*3/uL (ref 0.0–0.5)
Eosinophils Relative: 1 %
HCT: 22.5 % — ABNORMAL LOW (ref 36.0–46.0)
Hemoglobin: 7.1 g/dL — ABNORMAL LOW (ref 12.0–15.0)
Immature Granulocytes: 2 %
Lymphocytes Relative: 16 %
Lymphs Abs: 1.6 10*3/uL (ref 0.7–4.0)
MCH: 26.7 pg (ref 26.0–34.0)
MCHC: 31.6 g/dL (ref 30.0–36.0)
MCV: 84.6 fL (ref 80.0–100.0)
Monocytes Absolute: 0.7 10*3/uL (ref 0.1–1.0)
Monocytes Relative: 6 %
Neutro Abs: 7.9 10*3/uL — ABNORMAL HIGH (ref 1.7–7.7)
Neutrophils Relative %: 75 %
Platelets: 77 10*3/uL — ABNORMAL LOW (ref 150–400)
RBC: 2.66 MIL/uL — ABNORMAL LOW (ref 3.87–5.11)
RDW: 21.9 % — ABNORMAL HIGH (ref 11.5–15.5)
WBC: 10.5 10*3/uL (ref 4.0–10.5)
nRBC: 0 % (ref 0.0–0.2)

## 2019-12-16 LAB — CULTURE, BLOOD (ROUTINE X 2)
Special Requests: ADEQUATE
Special Requests: ADEQUATE

## 2019-12-16 LAB — BASIC METABOLIC PANEL
Anion gap: 7 (ref 5–15)
BUN: 8 mg/dL (ref 8–23)
CO2: 28 mmol/L (ref 22–32)
Calcium: 7.3 mg/dL — ABNORMAL LOW (ref 8.9–10.3)
Chloride: 101 mmol/L (ref 98–111)
Creatinine, Ser: 1.44 mg/dL — ABNORMAL HIGH (ref 0.44–1.00)
GFR calc Af Amer: 41 mL/min — ABNORMAL LOW (ref >60)
GFR calc non Af Amer: 35 mL/min — ABNORMAL LOW (ref >60)
Glucose, Bld: 105 mg/dL — ABNORMAL HIGH (ref 70–99)
Potassium: 2.7 mmol/L — CL (ref 3.5–5.1)
Sodium: 136 mmol/L (ref 135–145)

## 2019-12-16 LAB — PHOSPHORUS: Phosphorus: 2.1 mg/dL — ABNORMAL LOW (ref 2.5–4.6)

## 2019-12-16 LAB — GLUCOSE, CAPILLARY
Glucose-Capillary: 101 mg/dL — ABNORMAL HIGH (ref 70–99)
Glucose-Capillary: 84 mg/dL (ref 70–99)
Glucose-Capillary: 101 mg/dL — ABNORMAL HIGH (ref 70–99)
Glucose-Capillary: 98 mg/dL (ref 70–99)
Glucose-Capillary: 129 mg/dL — ABNORMAL HIGH (ref 70–99)

## 2019-12-16 LAB — MAGNESIUM: Magnesium: 1.6 mg/dL — ABNORMAL LOW (ref 1.7–2.4)

## 2019-12-16 LAB — POTASSIUM: Potassium: 3 mmol/L — ABNORMAL LOW (ref 3.5–5.1)

## 2019-12-16 MED ORDER — POTASSIUM CHLORIDE 10 MEQ/50ML IV SOLN
10.0000 meq | INTRAVENOUS | Status: AC
  Administered 2019-12-16 (×3): 10 meq via INTRAVENOUS
  Filled 2019-12-16 (×3): qty 50

## 2019-12-16 MED ORDER — POTASSIUM PHOSPHATES 15 MMOLE/5ML IV SOLN
20.0000 mmol | Freq: Once | INTRAVENOUS | Status: AC
  Administered 2019-12-16: 20 mmol via INTRAVENOUS
  Filled 2019-12-16: qty 6.67

## 2019-12-16 MED ORDER — IPRATROPIUM-ALBUTEROL 0.5-2.5 (3) MG/3ML IN SOLN: 3.0000 mL | Freq: Four times a day (QID) | RESPIRATORY_TRACT | Status: DC | PRN

## 2019-12-16 MED ORDER — BUDESONIDE 0.25 MG/2ML IN SUSP: 0.2500 mg | Freq: Two times a day (BID) | RESPIRATORY_TRACT | Status: DC | PRN

## 2019-12-16 MED ORDER — MAGNESIUM SULFATE 2 GM/50ML IV SOLN
2.0000 g | Freq: Once | INTRAVENOUS | Status: AC
  Administered 2019-12-16: 2 g via INTRAVENOUS
  Filled 2019-12-16: qty 50

## 2019-12-16 NOTE — Progress Notes (Signed)
Palliative: Taylor Tyler is resting quietly in bed.  She appears acutely/chronically ill and frail.  She does not interact in any meaningful way.  She is unable to make her basic needs known. She has had no by mouth intake. There is no family at bedside at this time.   Call to son Taylor Tyler.   We talk about living will.  Taylor Tyler states that he has not gotten Taylor Tyler's living will.  He went over to her house and got several papers, but the attorney is out of town.  He talks about power of attorney, but I remind him that the law states he and Taylor Tyler are surrogate decision makers.  We talk about nutrition needs, no meaningful intake.  Taylor Tyler states that he would except a temporary feeding tube.  Considering what a permanent tube would look like.  We talked in detail about risks versus benefits.  We review selected labs such as white blood cells and hemoglobin.  Taylor Tyler states that Taylor Tyler would accept blood products if needed.  Taylor Tyler asks if there are any plans for drainage of her fluid pocket along the spine.  I share that neurosurgery has declined interventions, this question will be shared with attending.  Conference with attending, bedside nursing staff, transition care team related to patient condition, needs, goals of care.  Plan:   At this point full scope/full code, open to any and all interventions.  62 minutes Taylor Axe, NP Palliative Medicine Team Team Phone # [phone] Greater than 50% of this time was spent counseling and coordinating care related to the above asessement and plan.

## 2019-12-16 NOTE — Progress Notes (Signed)
Pharmacy Antibiotic Note  Taylor Tyler is a 79 y.o. female admitted on 12/11/2019 with sepsis. Patient with sacral wounds and concern for osteomyelitis as well as spinal abscess. Patient with h/o Proteus in lumbar culture as well as multiple organisms in wound cultures. Now presenting with Proteus and Citrobacter bacteremia. Pharmacy has been consulted for Cefepime dosing. Metronidazole for anaerobic coverage. ID and surgery consulted. No plan for surgical intervention at this time.  Patient with ESRD on HD TTS. Per nephrology, plan to continue with regular dialysis schedule.  Plan: Continue cefepime 1 g IV q24h to be given in the evening.  Height: 5\' 6"  (167.6 cm) Weight: 79.9 kg (176 lb 2.4 oz) IBW/kg (Calculated) : 59.3  Temp (24hrs), Avg:98.1 F (36.7 C), Min:97.4 F (36.3 C), Max:98.9 F (37.2 C)  Recent Labs  Lab 12/11/19 1945 12/11/19 1945 12/12/19 0206 12/12/19 0411 12/13/19 0853 12/14/19 0600 12/15/19 0414 12/16/19 0343  WBC 11.5*   < >  --  15.8* 13.8* 8.8 10.1 10.5  CREATININE 2.27*   < >  --  2.14* 2.67* 1.80* 2.17* 1.44*  LATICACIDVEN 6.6*  --  1.4  --   --   --   --   --    < > = values in this interval not displayed.    Estimated Creatinine Clearance: 35.4 mL/min (A) (by C-G formula based on SCr of 1.44 mg/dL (H)).    Allergies  Allergen Reactions  . Clams [Shellfish Allergy] Swelling and Other (See Comments)    THROAT SWELLS NECK TURNS RED  . Hydralazine Other (See Comments)    CHEST TIGHTNESS Patient has tolerated multiple doses of hydralazine since allergy was listed   . Norvasc [Amlodipine Besylate] Swelling    Leg edema   . Milk-Related Compounds Diarrhea   Microbiology Results: 5/9 BCx: proteus mirabilis, citrobacter youngae 5/10 Wound Cx (sacral): E.coli, citrobacter, proteus mirabilis, bacteroides ovatus 5/10 Wound Cx (lumbar spine): proteus mirabilis  Thank you for allowing pharmacy to be a part of this patient's care.  Dorena Bodo,  PharmD Clinical Pharmacist 12/16/2019 3:23 PM

## 2019-12-16 NOTE — Progress Notes (Signed)
Shift summary:  - Assumed care at 1100 hrs from [facility], RN. - Patient is non-verbal, rigoring. - VS WNL and stable for now. - On-going goals-of-care discussions with the family.

## 2019-12-16 NOTE — Progress Notes (Signed)
CRITICAL CARE NOTE 79 yo female with ESRD on HD admitted with acute toxic metabolic encephalopathy secondary to severe sepsis in the setting of UTI, chronic nonhealing sacral decubitus ulcer CT suggestive of osteomyelitis along with L4 vertebral body postoperative collection/abscess requiring levophed gtt   SIGNIFICANT EVENTS/STUDIES:  05/9: Pt presented to [facility] ER minimally responsive and hypoxic.   05/9: Pt admitted to ICU with septic shock requiring levophed gtt with improvement in mentation and hypoxia resolved 05/9: CT Head revealed no acute intracranial abnormality. Mild to moderate severity cerebral atrophy and microvascular disease changes of the supratentorial brain. Mild sphenoid sinus disease. 05/9: CT Abd/Pelvis revealed extensive postoperative changes within the lumbar spine, as described above, with a 6.2 cm x 4.5 cm area of air and fluid attenuation seen posterior to the spinal canal at the level of the L4 vertebral body. This may represent a postoperative collection/abscess. Large, ventral hernias, as described above. Sacral decubitus ulcer with additional findings suggestive of acute osteomyelitis. Stable 6 mm nonobstructing renal stone within the left kidney. Sigmoid diverticulosis.  5/10 remains on pressors, slight increased WOB, lethargic 5/13-5/14 off vasopressors, palliative has met with family Lethargic, unable to provide ROS  CC  Follow up septic shock  HPI Off pressors +encephalopathy Poor prognosis Unresponsive    BP (!) 123/51   Pulse 80   Temp 97.8 F (36.6 C) (Axillary)   Resp 13   Ht '5\' 6"'  (1.676 m)   Wt 79.9 kg   SpO2 94%   BMI 28.43 kg/m    I/O last 3 completed shifts: In: 2844.5 [I.V.:1841.6; IV Piggyback:1002.9] Out: 296 [Other:296] Total I/O In: 718.1 [I.V.:388.7; IV Piggyback:329.4] Out: -   SpO2: 94 % O2 Flow Rate (L/min): 2 L/min  Estimated body mass index is 28.43 kg/m as calculated from the following:   Height as of this  encounter: '5\' 6"'  (1.676 m).   Weight as of this encounter: 79.9 kg.  REVIEW OF SYSTEMS  PATIENT IS UNABLE TO PROVIDE COMPLETE REVIEW OF SYSTEM S DUE TO SEVERE  AND ENCEPHALOPATHY     Pressure Injury 11/19/19 Sacrum Mid;Right Unstageable - Full thickness tissue loss in which the base of the injury is covered by slough (yellow, tan, gray, green or brown) and/or eschar (tan, brown or black) in the wound bed. deep wound with bed covered  (Active)  11/19/19 1800  Location: Sacrum  Location Orientation: Mid;Right  Staging: Unstageable - Full thickness tissue loss in which the base of the injury is covered by slough (yellow, tan, gray, green or brown) and/or eschar (tan, brown or black) in the wound bed.  Wound Description (Comments): deep wound with bed covered by slough  Present on Admission: Yes     Pressure Injury 11/19/19 Heel Left Deep Tissue Pressure Injury - Purple or maroon localized area of discolored intact skin or blood-filled blister due to damage of underlying soft tissue from pressure and/or shear. (Active)  11/19/19 1800  Location: Heel  Location Orientation: Left  Staging: Deep Tissue Pressure Injury - Purple or maroon localized area of discolored intact skin or blood-filled blister due to damage of underlying soft tissue from pressure and/or shear.  Wound Description (Comments):   Present on Admission: Yes    PHYSICAL EXAMINATION:  GENERAL:critically ill appearing,  HEAD: Normocephalic, atraumatic.  EYES: Pupils equal, round, reactive to light.  No scleral icterus.  MOUTH: Moist mucosal membrane. NECK: Supple. No thyromegaly. No nodules. No JVD.  PULMONARY: +rhonchi, +wheezing CARDIOVASCULAR: S1 and S2. Regular rate and rhythm. No  murmurs, rubs, or gallops.  GASTROINTESTINAL: Soft, nontender, -distended. Positive bowel sounds.  MUSCULOSKELETAL:+ edema.  SACRAL DECUB NEUROLOGIC: obtunded SKIN:intact,warm,dry     ALL OTHER ROS ARE NEGATIVE   MEDICATIONS: I have  reviewed all medications and confirmed regimen as documented   CULTURE RESULTS   Recent Results (from the past 240 hour(s))  Blood Culture (routine x 2)     Status: Abnormal   Collection Time: 12/11/19  7:45 PM   Specimen: BLOOD  Result Value Ref Range Status   Specimen Description   Final    BLOOD RIGHT FA Performed at [facility], [address] [place], [place] [address]    Special Requests   Final    BOTTLES DRAWN AEROBIC AND ANAEROBIC Blood Culture adequate volume Performed at [facility], [address] [place], [place] [address]    Culture  Setup Time   Final    GRAM NEGATIVE RODS IN BOTH AEROBIC AND ANAEROBIC BOTTLES CRITICAL RESULT CALLED TO, READ BACK BY AND VERIFIED WITHRayna Sexton AT 2707 ON 12/12/19 SNG    Culture (A)  Final    PROTEUS MIRABILIS CITROBACTER YOUNGAE SUSCEPTIBILITIES PERFORMED ON PREVIOUS CULTURE WITHIN THE LAST 5 DAYS. Performed at [facility], [place] [address], [place], [place] [address]    Report Status 12/16/2019 FINAL  Final  Blood Culture (routine x 2)     Status: Abnormal   Collection Time: 12/11/19  7:45 PM   Specimen: BLOOD  Result Value Ref Range Status   Specimen Description   Final    BLOOD RIGHT ARM Performed at [facility], [address] [place], [place] [address]    Special Requests   Final    BOTTLES DRAWN AEROBIC AND ANAEROBIC Blood Culture adequate volume Performed at [facility], [address], [place], [place] [address]    Culture  Setup Time   Final    GRAM NEGATIVE RODS IN BOTH AEROBIC AND ANAEROBIC BOTTLES CRITICAL VALUE NOTED.  VALUE IS CONSISTENT WITH PREVIOUSLY REPORTED AND CALLED VALUE. Performed at [facility], [address] [place], [place] [address]    Culture PROTEUS MIRABILIS Su Hoff  (A)  Final   Report Status 12/16/2019 FINAL  Final   Organism ID, Bacteria PROTEUS MIRABILIS  Final   Organism ID, Bacteria CITROBACTER YOUNGAE   Final      Susceptibility   Citrobacter youngae - MIC*    CEFAZOLIN >=64 RESISTANT Resistant     CEFEPIME <=1 SENSITIVE Sensitive     CEFTAZIDIME >=64 RESISTANT Resistant     CEFTRIAXONE >=64 RESISTANT Resistant     CIPROFLOXACIN <=0.25 SENSITIVE Sensitive     GENTAMICIN <=1 SENSITIVE Sensitive     IMIPENEM 1 SENSITIVE Sensitive     TRIMETH/SULFA <=20 SENSITIVE Sensitive     PIP/TAZO 64 INTERMEDIATE Intermediate     * CITROBACTER YOUNGAE   Proteus mirabilis - MIC*    AMPICILLIN <=2 SENSITIVE Sensitive     CEFAZOLIN <=4 SENSITIVE Sensitive     CEFEPIME <=1 SENSITIVE Sensitive     CEFTAZIDIME <=1 SENSITIVE Sensitive     CEFTRIAXONE <=1 SENSITIVE Sensitive     CIPROFLOXACIN <=0.25 SENSITIVE Sensitive     GENTAMICIN <=1 SENSITIVE Sensitive     IMIPENEM 4 SENSITIVE Sensitive     TRIMETH/SULFA <=20 SENSITIVE Sensitive     AMPICILLIN/SULBACTAM <=2 SENSITIVE Sensitive     PIP/TAZO <=4 SENSITIVE Sensitive     * PROTEUS MIRABILIS  Blood Culture ID Panel (Reflexed)  Status: Abnormal   Collection Time: 12/11/19  7:45 PM  Result Value Ref Range Status   Enterococcus species NOT DETECTED NOT DETECTED Final   Listeria monocytogenes NOT DETECTED NOT DETECTED Final   Staphylococcus species NOT DETECTED NOT DETECTED Final   Staphylococcus aureus (BCID) NOT DETECTED NOT DETECTED Final   Streptococcus species NOT DETECTED NOT DETECTED Final   Streptococcus agalactiae NOT DETECTED NOT DETECTED Final   Streptococcus pneumoniae NOT DETECTED NOT DETECTED Final   Streptococcus pyogenes NOT DETECTED NOT DETECTED Final   Acinetobacter baumannii NOT DETECTED NOT DETECTED Final   Enterobacteriaceae species DETECTED (A) NOT DETECTED Final    Comment: Enterobacteriaceae represent a large family of gram-negative bacteria, not a single organism. CRITICAL RESULT CALLED TO, READ BACK BY AND VERIFIED WITH: Rayna Sexton AT 4098 ON 12/12/19 SNG    Enterobacter cloacae complex NOT DETECTED NOT DETECTED Final    Escherichia coli NOT DETECTED NOT DETECTED Final   Klebsiella oxytoca NOT DETECTED NOT DETECTED Final   Klebsiella pneumoniae NOT DETECTED NOT DETECTED Final   Proteus species DETECTED (A) NOT DETECTED Final    Comment: CRITICAL RESULT CALLED TO, READ BACK BY AND VERIFIED WITH: Rayna Sexton AT 1191 ON 12/12/19 SNG    Serratia marcescens NOT DETECTED NOT DETECTED Final   Carbapenem resistance NOT DETECTED NOT DETECTED Final   Haemophilus influenzae NOT DETECTED NOT DETECTED Final   Neisseria meningitidis NOT DETECTED NOT DETECTED Final   Pseudomonas aeruginosa NOT DETECTED NOT DETECTED Final   Candida albicans NOT DETECTED NOT DETECTED Final   Candida glabrata NOT DETECTED NOT DETECTED Final   Candida krusei NOT DETECTED NOT DETECTED Final   Candida parapsilosis NOT DETECTED NOT DETECTED Final   Candida tropicalis NOT DETECTED NOT DETECTED Final    Comment: Performed at [facility], [address] [place], [place] [address]  Urine culture     Status: Abnormal   Collection Time: 12/11/19  8:09 PM   Specimen: In/Out Cath Urine  Result Value Ref Range Status   Specimen Description   Final    IN/OUT CATH URINE Performed at [facility], [address], [place], [place] [address]    Special Requests   Final    NONE Performed at [facility], [address] [place], [place] [address]    Culture MULTIPLE SPECIES PRESENT, SUGGEST RECOLLECTION (A)  Final   Report Status 12/12/2019 FINAL  Final  Respiratory Panel by RT PCR (Flu A&B, Covid) - Nasopharyngeal Swab     Status: None   Collection Time: 12/11/19  9:17 PM   Specimen: Nasopharyngeal Swab  Result Value Ref Range Status   SARS Coronavirus 2 by RT PCR NEGATIVE NEGATIVE Final    Comment: (NOTE) SARS-CoV-2 target nucleic acids are NOT DETECTED. The SARS-CoV-2 RNA is generally detectable in upper respiratoy specimens during the acute phase of infection. The lowest concentration of SARS-CoV-2 viral copies  this assay can detect is 131 copies/mL. A negative result does not preclude SARS-Cov-2 infection and should not be used as the sole basis for treatment or other patient management decisions. A negative result may occur with  improper specimen collection/handling, submission of specimen other than nasopharyngeal swab, presence of viral mutation(s) within the areas targeted by this assay, and inadequate number of viral copies (<131 copies/mL). A negative result must be combined with clinical observations, patient history, and epidemiological information. The expected result is Negative. Fact Sheet for Patients:  PinkCheek.be Fact Sheet for Healthcare Providers:  GravelBags.it This test is  not yet ap proved or cleared by the [place] and  has been authorized for detection and/or diagnosis of SARS-CoV-2 by FDA under an Emergency Use Authorization (EUA). This EUA will remain  in effect (meaning this test can be used) for the duration of the COVID-19 declaration under Section 564(b)(1) of the Act, 21 U.S.C. section 360bbb-3(b)(1), unless the authorization is terminated or revoked sooner.    Influenza A by PCR NEGATIVE NEGATIVE Final   Influenza B by PCR NEGATIVE NEGATIVE Final    Comment: (NOTE) The Xpert Xpress SARS-CoV-2/FLU/RSV assay is intended as an aid in  the diagnosis of influenza from Nasopharyngeal swab specimens and  should not be used as a sole basis for treatment. Nasal washings and  aspirates are unacceptable for Xpert Xpress SARS-CoV-2/FLU/RSV  testing. Fact Sheet for Patients: PinkCheek.be Fact Sheet for Healthcare Providers: GravelBags.it This test is not yet approved or cleared by the [place] FDA and  has been authorized for detection and/or diagnosis of SARS-CoV-2 by  FDA under an Emergency Use Authorization (EUA). This EUA will remain  in  effect (meaning this test can be used) for the duration of the  Covid-19 declaration under Section 564(b)(1) of the Act, 21  U.S.C. section 360bbb-3(b)(1), unless the authorization is  terminated or revoked. Performed at [facility], [address] [place], [place] [address]   MRSA PCR Screening     Status: None   Collection Time: 12/12/19 12:29 AM   Specimen: Nasopharyngeal  Result Value Ref Range Status   MRSA by PCR NEGATIVE NEGATIVE Final    Comment:        The GeneXpert MRSA Assay (FDA approved for NASAL specimens only), is one component of a comprehensive MRSA colonization surveillance program. It is not intended to diagnose MRSA infection nor to guide or monitor treatment for MRSA infections. Performed at [facility], [address] [place], [place] [address]   Aerobic/Anaerobic Culture (surgical/deep wound)     Status: None   Collection Time: 12/12/19  2:06 AM   Specimen: Sacral; Wound  Result Value Ref Range Status   Specimen Description   Final    SACRAL Performed at [facility], [address] [place], [place] [address]    Special Requests   Final    NONE Performed at [facility], [place], [place] [address]    Gram Stain NO WBC SEEN NO ORGANISMS SEEN   Final   Culture   Final    FEW ESCHERICHIA COLI FEW CITROBACTER SPECIES FEW PROTEUS MIRABILIS FEW BACTEROIDES OVATUS BETA LACTAMASE POSITIVE Performed at [facility], [place] [address], [place], [place] [address]    Report Status 12/15/2019 FINAL  Final   Organism ID, Bacteria ESCHERICHIA COLI  Final   Organism ID, Bacteria CITROBACTER SPECIES  Final   Organism ID, Bacteria PROTEUS MIRABILIS  Final      Susceptibility   Citrobacter species - MIC*    CEFAZOLIN >=64 RESISTANT Resistant     CEFEPIME <=1 SENSITIVE Sensitive     CEFTAZIDIME >=64 RESISTANT Resistant     CEFTRIAXONE >=64 RESISTANT Resistant     CIPROFLOXACIN <=0.25 SENSITIVE  Sensitive     GENTAMICIN <=1 SENSITIVE Sensitive     IMIPENEM <=0.25 SENSITIVE Sensitive     TRIMETH/SULFA <=20 SENSITIVE Sensitive     PIP/TAZO 64 INTERMEDIATE Intermediate     * FEW CITROBACTER SPECIES   Escherichia coli - MIC*    AMPICILLIN 16 INTERMEDIATE Intermediate  CEFAZOLIN >=64 RESISTANT Resistant     CEFEPIME <=1 SENSITIVE Sensitive     CEFTAZIDIME <=1 SENSITIVE Sensitive     CEFTRIAXONE <=1 SENSITIVE Sensitive     CIPROFLOXACIN <=0.25 SENSITIVE Sensitive     GENTAMICIN <=1 SENSITIVE Sensitive     IMIPENEM <=0.25 SENSITIVE Sensitive     TRIMETH/SULFA <=20 SENSITIVE Sensitive     AMPICILLIN/SULBACTAM 4 SENSITIVE Sensitive     PIP/TAZO <=4 SENSITIVE Sensitive     * FEW ESCHERICHIA COLI   Proteus mirabilis - MIC*    AMPICILLIN <=2 SENSITIVE Sensitive     CEFAZOLIN <=4 SENSITIVE Sensitive     CEFEPIME <=1 SENSITIVE Sensitive     CEFTAZIDIME <=1 SENSITIVE Sensitive     CEFTRIAXONE <=1 SENSITIVE Sensitive     CIPROFLOXACIN <=0.25 SENSITIVE Sensitive     GENTAMICIN 2 SENSITIVE Sensitive     IMIPENEM 4 SENSITIVE Sensitive     TRIMETH/SULFA <=20 SENSITIVE Sensitive     AMPICILLIN/SULBACTAM <=2 SENSITIVE Sensitive     PIP/TAZO <=4 SENSITIVE Sensitive     * FEW PROTEUS MIRABILIS  Aerobic/Anaerobic Culture (surgical/deep wound)     Status: None (Preliminary result)   Collection Time: 12/12/19  2:06 AM   Specimen: Back; Wound  Result Value Ref Range Status   Specimen Description   Final    BACK LUMBAR SPINE Performed at [facility], [address] [place], [place] [address]    Special Requests   Final    NONE Performed at [facility], [address] [place], [place] [address]    Gram Stain   Final    NO WBC SEEN NO ORGANISMS SEEN Performed at [facility], [place] [address], [place], [place] [address]    Culture   Final    FEW PROTEUS MIRABILIS NO ANAEROBES ISOLATED; CULTURE IN PROGRESS FOR 5 DAYS    Report Status PENDING  Incomplete    Organism ID, Bacteria PROTEUS MIRABILIS  Final      Susceptibility   Proteus mirabilis - MIC*    AMPICILLIN <=2 SENSITIVE Sensitive     CEFAZOLIN <=4 SENSITIVE Sensitive     CEFEPIME <=1 SENSITIVE Sensitive     CEFTAZIDIME <=1 SENSITIVE Sensitive     CEFTRIAXONE <=1 SENSITIVE Sensitive     CIPROFLOXACIN <=0.25 SENSITIVE Sensitive     GENTAMICIN 2 SENSITIVE Sensitive     IMIPENEM 4 SENSITIVE Sensitive     TRIMETH/SULFA <=20 SENSITIVE Sensitive     AMPICILLIN/SULBACTAM <=2 SENSITIVE Sensitive     PIP/TAZO <=4 SENSITIVE Sensitive     * FEW PROTEUS MIRABILIS      ASSESSMENT AND PLAN SYNOPSIS  Proteus bacteremia and citrobacter bacteremia  Source from back wounds  SEPTIC SHOCK RESOLVED  INFECTIOUS DISEASE -continue antibiotics as prescribed -follow up cultures -follow up ID consultation  END STAGE  KIDNEY INJURY/Renal Failure HD as needed   NEUROLOGY Acute toxic metabolic encephalopathy MRI PENDING  ELECTROLYTES -follow labs as needed -replace as needed -pharmacy consultation and following    DVT/GI PRX ordered TRANSFUSIONS AS NEEDED MONITOR FSBS ASSESS the need for LABS as needed     VERY POOR PROGNOSIS, VERY POOR CHANCE OF MEANINGFUL RECOVERY, I RECOMMEND HOSPICE AND COMFORT CARE    Maylin [place] Patricia Pesa, M.D.  [facility]  Medical Director [place] Director [place] Department

## 2019-12-16 NOTE — Progress Notes (Signed)
ID history of end-stage renal disease, hypertension, diabetes, history of lumbar fusion, laminectomy 2014, with revision in 8250 complicated by wound infection due to Proteus  LDA  RT IJ- permacath for dialysis placed NOV 2020 Left IJ CVC 12/13/19   Patient Vitals for the past 24 hrs:  BP Temp Temp src Pulse Resp SpO2 Weight  12/16/19 1200 111/86 97.8 F (36.6 C) Axillary 83 10 100 % --  12/16/19 1100 130/63 -- -- 85 11 98 % --  12/16/19 1000 (!) 116/52 -- -- 79 (!) 9 98 % --  12/16/19 0900 (!) 107/57 -- -- 81 (!) 9 100 % --  12/16/19 0800 (!) 126/55 -- -- 79 (!) 8 98 % --  12/16/19 0750 -- (!) 97.4 F (36.3 C) Oral -- -- -- --  12/16/19 0746 -- -- -- -- -- 100 % --  12/16/19 0735 -- -- -- -- -- 98 % --  12/16/19 0700 (!) 131/54 -- -- 89 10 100 % --  12/16/19 0600 (!) 112/56 -- -- 75 (!) 9 100 % --  12/16/19 0500 (!) 141/52 -- -- 74 11 100 % --  12/16/19 0400 (!) 114/52 -- -- 69 10 100 % --  12/16/19 0300 (!) 110/59 -- -- 72 11 100 % --  12/16/19 0200 (!) 117/50 97.9 F (36.6 C) Axillary 70 11 100 % --  12/16/19 0152 -- -- -- -- -- -- 79.9 kg  12/16/19 0100 (!) 146/62 -- -- 97 11 100 % --  12/16/19 0000 (!) 127/54 -- -- 77 15 97 % --  12/15/19 2300 (!) 127/101 -- -- 97 11 96 % --  12/15/19 2200 (!) 123/55 -- -- 86 11 99 % --  12/15/19 2100 129/62 98.5 F (36.9 C) Axillary 88 10 98 % --  12/15/19 2015 126/62 98.9 F (37.2 C) Axillary 98 13 -- --  12/15/19 2010 (!) 132/59 -- -- 98 11 -- --  12/15/19 2000 (!) 116/54 -- -- 100 13 100 % --  12/15/19 1945 108/64 -- -- (!) 103 12 100 % --  12/15/19 1930 (!) 92/56 -- -- (!) 111 10 100 % --  12/15/19 1915 114/64 -- -- (!) 109 10 100 % --  12/15/19 1900 (!) 138/53 -- -- 85 14 98 % --  12/15/19 1845 114/62 -- -- 85 11 97 % --  12/15/19 1830 122/62 -- -- 80 13 95 % --  12/15/19 1815 (!) 137/59 -- -- 89 12 97 % --  12/15/19 1800 137/61 -- -- 80 12 98 % --  12/15/19 1745 (!) 109/55 -- -- 76 17 93 % --  12/15/19 1730 105/82 -- -- 89  13 93 % --  12/15/19 1715 (!) 134/51 -- -- 78 13 97 % --  12/15/19 1709 (!) 141/61 -- -- 77 12 93 % --  12/15/19 1700 (!) 143/63 -- -- 81 10 98 % 79 kg  12/15/19 1600 (!) 129/56 -- -- 76 10 96 % --  12/15/19 1500 (!) 132/59 -- -- 85 11 99 % --  12/15/19 1400 134/65 98 F (36.7 C) Axillary 88 10 98 % --  12/15/19 1300 (!) 110/53 -- -- 82 12 99 % --   Lethargic Feeble speech Chest b/l air entry Rt IJ permacath Left IJ CVC Sacral wound       CBC Latest Ref Rng & Units 12/16/2019 12/15/2019 12/14/2019  WBC 4.0 - 10.5 K/uL 10.5 10.1 8.8  Hemoglobin 12.0 - 15.0 g/dL 7.1(L) 7.3(L) 6.7(L)  Hematocrit  36.0 - 46.0 % 22.5(L) 24.4(L) 22.6(L)  Platelets 150 - 400 K/uL 77(L) 73(L) 82(L)    CMP Latest Ref Rng & Units 12/16/2019 12/16/2019 12/15/2019  Glucose 70 - 99 mg/dL - 105(H) 106(H)  BUN 8 - 23 mg/dL - 8 16  Creatinine 0.44 - 1.00 mg/dL - 1.44(H) 2.17(H)  Sodium 135 - 145 mmol/L - 136 131(L)  Potassium 3.5 - 5.1 mmol/L 3.0(L) 2.7(LL) 3.4(L)  Chloride 98 - 111 mmol/L - 101 99  CO2 22 - 32 mmol/L - 28 24  Calcium 8.9 - 10.3 mg/dL - 7.3(L) 7.2(L)  Total Protein 6.5 - 8.1 g/dL - - -  Total Bilirubin 0.3 - 1.2 mg/dL - - -  Alkaline Phos 38 - 126 U/L - - -  AST 15 - 41 U/L - - -  ALT 0 - 44 U/L - - -   Blood culture from 12/11/2019 4 out of 4 bottles Proteus mirabilis and Citrobacter youngae Proteus is pansensitive Citrobacter is sensitive to cefepime, ciprofloxacin, gentamicin and imipenem and trimethoprim sulfamethoxazole  12/12/2019 Site of prior surgery at the lumbar spine L4 area Proteus mirabilis pansensitive.  12/12/2019 from the new sacral ulcer Multiple organisms including E. coli, Citrobacter, Bacteroides, Proteus.   Impression/recommendation  Proteus bacteremia and citrobacter bacteremia  Source from back wounds Patient has had a complicated infectious history and initially had hardware which was infected with Proteus the entire system was removed in November 2020. The  recent CT abdomen shows 6.2 cm x 4.5 cm area of air and fluidcolectionseen posterior to the spinal canal at the level of the L4 vertebral body. The lumbar wound has proteus in the culture The sacral ulcer has citrobacter and ecoli  The collection will have to be drained  by  IR. Spoke to . Discussed  with IR. They can drain the fluid.  As family has opted for aggresisve management, patient needs to be off heparin Sunday night and IR to be contacted on [facility] for drain. Discussed with Dr.Shick on Wednesday May need to change the permacath  Continue cefepime and Flagyl   Encephalopathy due to the above  Sacral stage IV sacral decubitus new with underlying osteomyelitis. Surgery has  debrided the wound. citrobacter and e.coli so far On cefepime and Flagyl   Discussed the management with ICU NP ID wil follow her peripherally this weekend?

## 2019-12-16 NOTE — Plan of Care (Signed)
  Problem: Education: Goal: Knowledge of General Education information will improve Description: Including pain rating scale, medication(s)/side effects and non-pharmacologic comfort measures Outcome: Not Progressing   Problem: Health Behavior/Discharge Planning: Goal: Ability to manage health-related needs will improve Outcome: Not Progressing   Problem: Clinical Measurements: Goal: Ability to maintain clinical measurements within normal limits will improve Outcome: Not Progressing Goal: Will remain free from infection Outcome: Not Progressing Goal: Diagnostic test results will improve Outcome: Not Progressing   Problem: Activity: Goal: Risk for activity intolerance will decrease Outcome: Not Progressing   Problem: Nutrition: Goal: Adequate nutrition will be maintained Outcome: Not Progressing   Problem: Coping: Goal: Level of anxiety will decrease Outcome: Not Progressing   Problem: Elimination: Goal: Will not experience complications related to bowel motility Outcome: Not Progressing Goal: Will not experience complications related to urinary retention Outcome: Not Progressing   Problem: Pain Managment: Goal: General experience of comfort will improve Outcome: Not Progressing   Problem: Safety: Goal: Ability to remain free from injury will improve Outcome: Not Progressing   Problem: Skin Integrity: Goal: Risk for impaired skin integrity will decrease Outcome: Not Progressing

## 2019-12-16 NOTE — Progress Notes (Signed)
[facility]  ROUNDING NOTE   Subjective:  Patient is awake this a.m. but nonverbal. Does appear to track. Completed dialysis treatment yesterday.  Objective:  Vital signs in last 24 hours:  Temp:  [97.4 F (36.3 C)-98.9 F (37.2 C)] 97.8 F (36.6 C) (05/14 1200) Pulse Rate:  [69-111] 80 (05/14 1300) Resp:  [8-17] 13 (05/14 1300) BP: (92-146)/(50-101) 123/51 (05/14 1300) SpO2:  [93 %-100 %] 94 % (05/14 1303) Weight:  [79 kg-79.9 kg] 79.9 kg (05/14 0152)  Weight change: 0 kg Filed Weights   12/15/19 0119 12/15/19 1700 12/16/19 0152  Weight: 79 kg 79 kg 79.9 kg    Intake/Output: I/O last 3 completed shifts: In: 2844.5 [I.V.:1841.6; IV Piggyback:1002.9] Out: 296 [Other:296]   Intake/Output this shift:  Total I/O In: 718.1 [I.V.:388.7; IV Piggyback:329.4] Out: -   Physical Exam: General: Critically ill-appearing  Head: Normocephalic, atraumatic. Moist oral mucosal membranes  Eyes: Anicteric  Neck: Supple, trachea midline  Lungs:  Clear to auscultation, normal effort  Heart: S1S2 no rubs  Abdomen:  Soft, nontender, bowel sounds present  Extremities: Trace peripheral edema.  Neurologic: Lethargic, but arousable.  Skin: Warm/dry  Access: Right IJ PermCath    Basic Metabolic Panel: Recent Labs  Lab 12/12/19 0411 12/12/19 0411 12/12/19 1115 12/13/19 0853 12/13/19 0853 12/14/19 0600 12/15/19 0414 12/16/19 0343 12/16/19 0900  NA 135  --   --  137  --  134* 131* 136  --   K 2.7*   < > 3.5 3.1*  --  2.9* 3.4* 2.7* 3.0*  CL 106  --   --  107  --  100 99 101  --   CO2 23  --   --  22  --  26 24 28   --   GLUCOSE 180*  --   --  88  --  77 106* 105*  --   BUN 20  --   --  29*  --  14 16 8   --   CREATININE 2.14*  --   --  2.67*  --  1.80* 2.17* 1.44*  --   CALCIUM 7.0*   < >  --  7.5*   < > 7.2* 7.2* 7.3*  --   MG 1.4*  --   --  2.8*  --  2.1 1.9 1.6*  --   PHOS 1.7*   < > 2.2* 2.7  --  1.6* 3.5 2.1*  --    < > = values in this interval not displayed.     Liver Function Tests: Recent Labs  Lab 12/11/19 1945  AST 30  ALT 23  ALKPHOS 90  BILITOT 0.4  PROT 5.1*  ALBUMIN 1.5*   No results for input(s): LIPASE, AMYLASE in the last 168 hours. Recent Labs  Lab 12/11/19 1945  AMMONIA 44*    CBC: Recent Labs  Lab 12/12/19 0411 12/13/19 0853 12/14/19 0600 12/15/19 0414 12/16/19 0343  WBC 15.8* 13.8* 8.8 10.1 10.5  NEUTROABS 13.9* 11.7* 6.7 8.6* 7.9*  HGB 7.9* 8.0* 6.7* 7.3* 7.1*  HCT 26.7* 26.2* 22.6* 24.4* 22.5*  MCV 92.1 89.4 89.7 89.1 84.6  PLT 134* 109* 82* 73* 77*    Cardiac Enzymes: No results for input(s): CKTOTAL, CKMB, CKMBINDEX, TROPONINI in the last 168 hours.  BNP: Invalid input(s): POCBNP  CBG: Recent Labs  Lab 12/15/19 2017 12/15/19 2325 12/16/19 0343 12/16/19 0743 12/16/19 1133  GLUCAP 93 90 101* 84 101*    Microbiology: Results for orders placed or performed during  the hospital encounter of 12/11/19  Blood Culture (routine x 2)     Status: Abnormal   Collection Time: 12/11/19  7:45 PM   Specimen: BLOOD  Result Value Ref Range Status   Specimen Description   Final    BLOOD RIGHT FA Performed at [facility], [address], [place], [place] [address]    Special Requests   Final    BOTTLES DRAWN AEROBIC AND ANAEROBIC Blood Culture adequate volume Performed at [facility], [address] [place], [place] [address]    Culture  Setup Time   Final    GRAM NEGATIVE RODS IN BOTH AEROBIC AND ANAEROBIC BOTTLES CRITICAL RESULT CALLED TO, READ BACK BY AND VERIFIED WITH: Rayna Sexton AT 8099 ON 12/12/19 SNG    Culture (A)  Final    PROTEUS MIRABILIS CITROBACTER YOUNGAE SUSCEPTIBILITIES PERFORMED ON PREVIOUS CULTURE WITHIN THE LAST 5 DAYS. Performed at [facility], [place] [address] [place], [place] [address]    Report Status 12/16/2019 FINAL  Final  Blood Culture (routine x 2)     Status: Abnormal   Collection Time: 12/11/19  7:45 PM   Specimen: BLOOD  Result Value  Ref Range Status   Specimen Description   Final    BLOOD RIGHT ARM Performed at [facility], [address] [place], [place] [address]    Special Requests   Final    BOTTLES DRAWN AEROBIC AND ANAEROBIC Blood Culture adequate volume Performed at [facility], [address] [place], [place] [address]    Culture  Setup Time   Final    GRAM NEGATIVE RODS IN BOTH AEROBIC AND ANAEROBIC BOTTLES CRITICAL VALUE NOTED.  VALUE IS CONSISTENT WITH PREVIOUSLY REPORTED AND CALLED VALUE. Performed at [facility], [address] [place], [place] [address]    Culture PROTEUS MIRABILIS Su Hoff  (A)  Final   Report Status 12/16/2019 FINAL  Final   Organism ID, Bacteria PROTEUS MIRABILIS  Final   Organism ID, Bacteria CITROBACTER YOUNGAE  Final      Susceptibility   Citrobacter youngae - MIC*    CEFAZOLIN >=64 RESISTANT Resistant     CEFEPIME <=1 SENSITIVE Sensitive     CEFTAZIDIME >=64 RESISTANT Resistant     CEFTRIAXONE >=64 RESISTANT Resistant     CIPROFLOXACIN <=0.25 SENSITIVE Sensitive     GENTAMICIN <=1 SENSITIVE Sensitive     IMIPENEM 1 SENSITIVE Sensitive     TRIMETH/SULFA <=20 SENSITIVE Sensitive     PIP/TAZO 64 INTERMEDIATE Intermediate     * CITROBACTER YOUNGAE   Proteus mirabilis - MIC*    AMPICILLIN <=2 SENSITIVE Sensitive     CEFAZOLIN <=4 SENSITIVE Sensitive     CEFEPIME <=1 SENSITIVE Sensitive     CEFTAZIDIME <=1 SENSITIVE Sensitive     CEFTRIAXONE <=1 SENSITIVE Sensitive     CIPROFLOXACIN <=0.25 SENSITIVE Sensitive     GENTAMICIN <=1 SENSITIVE Sensitive     IMIPENEM 4 SENSITIVE Sensitive     TRIMETH/SULFA <=20 SENSITIVE Sensitive     AMPICILLIN/SULBACTAM <=2 SENSITIVE Sensitive     PIP/TAZO <=4 SENSITIVE Sensitive     * PROTEUS MIRABILIS  Blood Culture ID Panel (Reflexed)     Status: Abnormal   Collection Time: 12/11/19  7:45 PM  Result Value Ref Range Status   Enterococcus species NOT DETECTED NOT DETECTED Final   Listeria  monocytogenes NOT DETECTED NOT DETECTED Final   Staphylococcus species NOT DETECTED NOT DETECTED Final   Staphylococcus aureus (BCID) NOT  DETECTED NOT DETECTED Final   Streptococcus species NOT DETECTED NOT DETECTED Final   Streptococcus agalactiae NOT DETECTED NOT DETECTED Final   Streptococcus pneumoniae NOT DETECTED NOT DETECTED Final   Streptococcus pyogenes NOT DETECTED NOT DETECTED Final   Acinetobacter baumannii NOT DETECTED NOT DETECTED Final   Enterobacteriaceae species DETECTED (A) NOT DETECTED Final    Comment: Enterobacteriaceae represent a large family of gram-negative bacteria, not a single organism. CRITICAL RESULT CALLED TO, READ BACK BY AND VERIFIED WITH: Rayna Sexton AT 1017 ON 12/12/19 SNG    Enterobacter cloacae complex NOT DETECTED NOT DETECTED Final   Escherichia coli NOT DETECTED NOT DETECTED Final   Klebsiella oxytoca NOT DETECTED NOT DETECTED Final   Klebsiella pneumoniae NOT DETECTED NOT DETECTED Final   Proteus species DETECTED (A) NOT DETECTED Final    Comment: CRITICAL RESULT CALLED TO, READ BACK BY AND VERIFIED WITH: Rayna Sexton AT 5102 ON 12/12/19 SNG    Serratia marcescens NOT DETECTED NOT DETECTED Final   Carbapenem resistance NOT DETECTED NOT DETECTED Final   Haemophilus influenzae NOT DETECTED NOT DETECTED Final   Neisseria meningitidis NOT DETECTED NOT DETECTED Final   Pseudomonas aeruginosa NOT DETECTED NOT DETECTED Final   Candida albicans NOT DETECTED NOT DETECTED Final   Candida glabrata NOT DETECTED NOT DETECTED Final   Candida krusei NOT DETECTED NOT DETECTED Final   Candida parapsilosis NOT DETECTED NOT DETECTED Final   Candida tropicalis NOT DETECTED NOT DETECTED Final    Comment: Performed at [facility], [address] [place], [place] [address]  Urine culture     Status: Abnormal   Collection Time: 12/11/19  8:09 PM   Specimen: In/Out Cath Urine  Result Value Ref Range Status   Specimen Description   Final    IN/OUT CATH  URINE Performed at [facility], [address], [place], [place] [address]    Special Requests   Final    NONE Performed at [facility], [address] [place], [place] [address]    Culture MULTIPLE SPECIES PRESENT, SUGGEST RECOLLECTION (A)  Final   Report Status 12/12/2019 FINAL  Final  Respiratory Panel by RT PCR (Flu A&B, Covid) - Nasopharyngeal Swab     Status: None   Collection Time: 12/11/19  9:17 PM   Specimen: Nasopharyngeal Swab  Result Value Ref Range Status   SARS Coronavirus 2 by RT PCR NEGATIVE NEGATIVE Final    Comment: (NOTE) SARS-CoV-2 target nucleic acids are NOT DETECTED. The SARS-CoV-2 RNA is generally detectable in upper respiratoy specimens during the acute phase of infection. The lowest concentration of SARS-CoV-2 viral copies this assay can detect is 131 copies/mL. A negative result does not preclude SARS-Cov-2 infection and should not be used as the sole basis for treatment or other patient management decisions. A negative result may occur with  improper specimen collection/handling, submission of specimen other than nasopharyngeal swab, presence of viral mutation(s) within the areas targeted by this assay, and inadequate number of viral copies (<131 copies/mL). A negative result must be combined with clinical observations, patient history, and epidemiological information. The expected result is Negative. Fact Sheet for Patients:  PinkCheek.be Fact Sheet for Healthcare Providers:  GravelBags.it This test is not yet ap proved or cleared by the [place] FDA and  has been authorized for detection and/or diagnosis of SARS-CoV-2 by FDA under an Emergency Use Authorization (EUA). This EUA will remain  in effect (meaning this test can be used) for the duration of the COVID-19 declaration  under Section 564(b)(1) of the Act, 21 U.S.C. section 360bbb-3(b)(1), unless the  authorization is terminated or revoked sooner.    Influenza A by PCR NEGATIVE NEGATIVE Final   Influenza B by PCR NEGATIVE NEGATIVE Final    Comment: (NOTE) The Xpert Xpress SARS-CoV-2/FLU/RSV assay is intended as an aid in  the diagnosis of influenza from Nasopharyngeal swab specimens and  should not be used as a sole basis for treatment. Nasal washings and  aspirates are unacceptable for Xpert Xpress SARS-CoV-2/FLU/RSV  testing. Fact Sheet for Patients: PinkCheek.be Fact Sheet for Healthcare Providers: GravelBags.it This test is not yet approved or cleared by the [place] FDA and  has been authorized for detection and/or diagnosis of SARS-CoV-2 by  FDA under an Emergency Use Authorization (EUA). This EUA will remain  in effect (meaning this test can be used) for the duration of the  Covid-19 declaration under Section 564(b)(1) of the Act, 21  U.S.C. section 360bbb-3(b)(1), unless the authorization is  terminated or revoked. Performed at [facility], [address] [place], [place] [address]   MRSA PCR Screening     Status: None   Collection Time: 12/12/19 12:29 AM   Specimen: Nasopharyngeal  Result Value Ref Range Status   MRSA by PCR NEGATIVE NEGATIVE Final    Comment:        The GeneXpert MRSA Assay (FDA approved for NASAL specimens only), is one component of a comprehensive MRSA colonization surveillance program. It is not intended to diagnose MRSA infection nor to guide or monitor treatment for MRSA infections. Performed at [facility], [address] [place], [place] [address]   Aerobic/Anaerobic Culture (surgical/deep wound)     Status: None   Collection Time: 12/12/19  2:06 AM   Specimen: Sacral; Wound  Result Value Ref Range Status   Specimen Description   Final    SACRAL Performed at [facility], [address] [place], [place] [address]    Special Requests    Final    NONE Performed at [facility], [place], [place] [address]    Gram Stain NO WBC SEEN NO ORGANISMS SEEN   Final   Culture   Final    FEW ESCHERICHIA COLI FEW CITROBACTER SPECIES FEW PROTEUS MIRABILIS FEW BACTEROIDES OVATUS BETA LACTAMASE POSITIVE Performed at [facility], [place] [address] [place], [place] [address]    Report Status 12/15/2019 FINAL  Final   Organism ID, Bacteria ESCHERICHIA COLI  Final   Organism ID, Bacteria CITROBACTER SPECIES  Final   Organism ID, Bacteria PROTEUS MIRABILIS  Final      Susceptibility   Citrobacter species - MIC*    CEFAZOLIN >=64 RESISTANT Resistant     CEFEPIME <=1 SENSITIVE Sensitive     CEFTAZIDIME >=64 RESISTANT Resistant     CEFTRIAXONE >=64 RESISTANT Resistant     CIPROFLOXACIN <=0.25 SENSITIVE Sensitive     GENTAMICIN <=1 SENSITIVE Sensitive     IMIPENEM <=0.25 SENSITIVE Sensitive     TRIMETH/SULFA <=20 SENSITIVE Sensitive     PIP/TAZO 64 INTERMEDIATE Intermediate     * FEW CITROBACTER SPECIES   Escherichia coli - MIC*    AMPICILLIN 16 INTERMEDIATE Intermediate     CEFAZOLIN >=64 RESISTANT Resistant     CEFEPIME <=1 SENSITIVE Sensitive     CEFTAZIDIME <=1 SENSITIVE Sensitive     CEFTRIAXONE <=1 SENSITIVE Sensitive     CIPROFLOXACIN <=0.25 SENSITIVE Sensitive     GENTAMICIN <=1 SENSITIVE Sensitive  IMIPENEM <=0.25 SENSITIVE Sensitive     TRIMETH/SULFA <=20 SENSITIVE Sensitive     AMPICILLIN/SULBACTAM 4 SENSITIVE Sensitive     PIP/TAZO <=4 SENSITIVE Sensitive     * FEW ESCHERICHIA COLI   Proteus mirabilis - MIC*    AMPICILLIN <=2 SENSITIVE Sensitive     CEFAZOLIN <=4 SENSITIVE Sensitive     CEFEPIME <=1 SENSITIVE Sensitive     CEFTAZIDIME <=1 SENSITIVE Sensitive     CEFTRIAXONE <=1 SENSITIVE Sensitive     CIPROFLOXACIN <=0.25 SENSITIVE Sensitive     GENTAMICIN 2 SENSITIVE Sensitive     IMIPENEM 4 SENSITIVE Sensitive     TRIMETH/SULFA <=20 SENSITIVE Sensitive      AMPICILLIN/SULBACTAM <=2 SENSITIVE Sensitive     PIP/TAZO <=4 SENSITIVE Sensitive     * FEW PROTEUS MIRABILIS  Aerobic/Anaerobic Culture (surgical/deep wound)     Status: None (Preliminary result)   Collection Time: 12/12/19  2:06 AM   Specimen: Back; Wound  Result Value Ref Range Status   Specimen Description   Final    BACK LUMBAR SPINE Performed at [facility], [address] [place], [place] [address]    Special Requests   Final    NONE Performed at [facility], [address] [place], [place] [address]    Gram Stain   Final    NO WBC SEEN NO ORGANISMS SEEN Performed at [facility], [place] [address], [place], [place] [address]    Culture   Final    FEW PROTEUS MIRABILIS NO ANAEROBES ISOLATED; CULTURE IN PROGRESS FOR 5 DAYS    Report Status PENDING  Incomplete   Organism ID, Bacteria PROTEUS MIRABILIS  Final      Susceptibility   Proteus mirabilis - MIC*    AMPICILLIN <=2 SENSITIVE Sensitive     CEFAZOLIN <=4 SENSITIVE Sensitive     CEFEPIME <=1 SENSITIVE Sensitive     CEFTAZIDIME <=1 SENSITIVE Sensitive     CEFTRIAXONE <=1 SENSITIVE Sensitive     CIPROFLOXACIN <=0.25 SENSITIVE Sensitive     GENTAMICIN 2 SENSITIVE Sensitive     IMIPENEM 4 SENSITIVE Sensitive     TRIMETH/SULFA <=20 SENSITIVE Sensitive     AMPICILLIN/SULBACTAM <=2 SENSITIVE Sensitive     PIP/TAZO <=4 SENSITIVE Sensitive     * FEW PROTEUS MIRABILIS    Coagulation Studies: No results for input(s): LABPROT, INR in the last 72 hours.  Urinalysis: No results for input(s): COLORURINE, LABSPEC, PHURINE, GLUCOSEU, HGBUR, BILIRUBINUR, KETONESUR, PROTEINUR, UROBILINOGEN, NITRITE, LEUKOCYTESUR in the last 72 hours.  Invalid input(s): APPERANCEUR    Imaging: No results found.   Medications:   . sodium chloride Stopped (12/12/19 2140)  . sodium chloride    . sodium chloride    . albumin human Stopped (12/15/19 1209)  . ceFEPime (MAXIPIME) IV Stopped (12/15/19 2046)  .  dextrose 5 % and 0.9% NaCl Stopped (12/16/19 1219)  . metronidazole Stopped (12/16/19 2778)  . norepinephrine (LEVOPHED) Adult infusion Stopped (12/16/19 1217)  . potassium PHOSPHATE IVPB (in mmol) 85 mL/hr at 12/16/19 1300   . budesonide (PULMICORT) nebulizer solution  0.25 mg Nebulization BID  . chlorhexidine  15 mL Mouth Rinse BID  . Chlorhexidine Gluconate Cloth  6 each Topical Q0600  . epoetin (EPOGEN/PROCRIT) injection  10,000 Units Intravenous Q T,Th,Sa-HD  . heparin  5,000 Units Subcutaneous Q8H  . ipratropium-albuterol  3 mL Nebulization TID  . mouth rinse  15 mL Mouth Rinse q12n4p  . pantoprazole (PROTONIX) IV  40 mg Intravenous Q24H  .  sodium chloride flush  10-40 mL Intracatheter Q12H   sodium chloride, sodium chloride, acetaminophen, alteplase, docusate sodium, heparin, ipratropium-albuterol, lidocaine (PF), lidocaine-prilocaine, morphine injection, ondansetron (ZOFRAN) IV, pentafluoroprop-tetrafluoroeth, polyethylene glycol, sodium chloride flush  Assessment/ Plan:  79 y.o. female  is a 79 y.o. black female with end stage renal disease on hemodialysis, dementia, diabetes mellitus type II, hypertension, hypothyroidism, osteomyelitis, now admitted with decreased responsiveness and sepsis.  [facility] [address] TTS RIJ [id number].  1.  ESRD on HD TTS.  Patient underwent dialysis treatment yesterday.  We will plan for dialysis treatment again tomorrow.  2.  Altered mental status.  Patient is arousable and tracks with her eyes but nonverbal.  3.  Anemia of chronic kidney disease.   Lab Results  Component Value Date   HGB 7.1 (L) 12/16/2019  Hemoglobin up to 7.1 posttransfusion.  Continue to monitor.  4.  Secondary hyperparathyroidism.  Phosphorus still a bit low at 2.1.  Pharmacy has been intermittently replacing.  5.  Sepsis.  Patient being treated with cefepime and metronidazole.  Also on pressors.     LOS: 5 Phu Record 5/14/20212:41  PM

## 2019-12-17 ENCOUNTER — Inpatient Hospital Stay: Payer: Medicare Other

## 2019-12-17 DIAGNOSIS — N186 End stage renal disease: Secondary | ICD-10-CM

## 2019-12-17 DIAGNOSIS — G9341 Metabolic encephalopathy: Secondary | ICD-10-CM

## 2019-12-17 DIAGNOSIS — R627 Adult failure to thrive: Secondary | ICD-10-CM

## 2019-12-17 DIAGNOSIS — D638 Anemia in other chronic diseases classified elsewhere: Secondary | ICD-10-CM

## 2019-12-17 LAB — CBC WITH DIFFERENTIAL/PLATELET
Abs Immature Granulocytes: 0.19 10*3/uL — ABNORMAL HIGH (ref 0.00–0.07)
Basophils Absolute: 0.1 10*3/uL (ref 0.0–0.1)
Basophils Relative: 1 %
Eosinophils Absolute: 0.1 10*3/uL (ref 0.0–0.5)
Eosinophils Relative: 1 %
HCT: 23.9 % — ABNORMAL LOW (ref 36.0–46.0)
Hemoglobin: 7.2 g/dL — ABNORMAL LOW (ref 12.0–15.0)
Immature Granulocytes: 2 %
Lymphocytes Relative: 20 %
Lymphs Abs: 1.9 10*3/uL (ref 0.7–4.0)
MCH: 26.7 pg (ref 26.0–34.0)
MCHC: 30.1 g/dL (ref 30.0–36.0)
MCV: 88.5 fL (ref 80.0–100.0)
Monocytes Absolute: 0.8 10*3/uL (ref 0.1–1.0)
Monocytes Relative: 9 %
Neutro Abs: 6.6 10*3/uL (ref 1.7–7.7)
Neutrophils Relative %: 67 %
Platelets: 112 10*3/uL — ABNORMAL LOW (ref 150–400)
RBC: 2.7 MIL/uL — ABNORMAL LOW (ref 3.87–5.11)
RDW: 22 % — ABNORMAL HIGH (ref 11.5–15.5)
WBC: 9.7 10*3/uL (ref 4.0–10.5)
nRBC: 0 % (ref 0.0–0.2)

## 2019-12-17 LAB — GLUCOSE, CAPILLARY
Glucose-Capillary: 100 mg/dL — ABNORMAL HIGH (ref 70–99)
Glucose-Capillary: 103 mg/dL — ABNORMAL HIGH (ref 70–99)
Glucose-Capillary: 107 mg/dL — ABNORMAL HIGH (ref 70–99)
Glucose-Capillary: 109 mg/dL — ABNORMAL HIGH (ref 70–99)
Glucose-Capillary: 93 mg/dL (ref 70–99)
Glucose-Capillary: 97 mg/dL (ref 70–99)

## 2019-12-17 LAB — RENAL FUNCTION PANEL
Albumin: 1.7 g/dL — ABNORMAL LOW (ref 3.5–5.0)
Anion gap: 7 (ref 5–15)
BUN: 10 mg/dL (ref 8–23)
CO2: 24 mmol/L (ref 22–32)
Calcium: 7.6 mg/dL — ABNORMAL LOW (ref 8.9–10.3)
Chloride: 103 mmol/L (ref 98–111)
Creatinine, Ser: 2.07 mg/dL — ABNORMAL HIGH (ref 0.44–1.00)
GFR calc Af Amer: 26 mL/min — ABNORMAL LOW (ref >60)
GFR calc non Af Amer: 23 mL/min — ABNORMAL LOW (ref >60)
Glucose, Bld: 117 mg/dL — ABNORMAL HIGH (ref 70–99)
Phosphorus: 3.7 mg/dL (ref 2.5–4.6)
Potassium: 3.6 mmol/L (ref 3.5–5.1)
Sodium: 134 mmol/L — ABNORMAL LOW (ref 135–145)

## 2019-12-17 LAB — AEROBIC/ANAEROBIC CULTURE W GRAM STAIN (SURGICAL/DEEP WOUND): Gram Stain: NONE SEEN

## 2019-12-17 LAB — CBC
HCT: 24.7 % — ABNORMAL LOW (ref 36.0–46.0)
Hemoglobin: 7.4 g/dL — ABNORMAL LOW (ref 12.0–15.0)
MCH: 26.7 pg (ref 26.0–34.0)
MCHC: 30 g/dL (ref 30.0–36.0)
MCV: 89.2 fL (ref 80.0–100.0)
Platelets: 133 10*3/uL — ABNORMAL LOW (ref 150–400)
RBC: 2.77 MIL/uL — ABNORMAL LOW (ref 3.87–5.11)
RDW: 22 % — ABNORMAL HIGH (ref 11.5–15.5)
WBC: 13.2 10*3/uL — ABNORMAL HIGH (ref 4.0–10.5)
nRBC: 0 % (ref 0.0–0.2)

## 2019-12-17 LAB — PHOSPHORUS: Phosphorus: 3.4 mg/dL (ref 2.5–4.6)

## 2019-12-17 LAB — MAGNESIUM: Magnesium: 2 mg/dL (ref 1.7–2.4)

## 2019-12-17 MED ORDER — NEPRO/CARBSTEADY PO LIQD
1000.0000 mL | ORAL | Status: DC
  Administered 2019-12-17 – 2019-12-18 (×2): 1000 mL

## 2019-12-17 MED ORDER — PRO-STAT SUGAR FREE PO LIQD
30.0000 mL | Freq: Two times a day (BID) | ORAL | Status: DC
  Administered 2019-12-17 – 2019-12-30 (×23): 30 mL

## 2019-12-17 NOTE — Progress Notes (Addendum)
Shift summary:  - Patient remains nonverbal; grimaces with personal care. PRN morphine given this AM.  - NGT placed per MD order this AM.  - Non-healing wounds to back and sacrum.  - RN spoke with patient's son, [name], for update. Family member denies questions or concerns at this time.

## 2019-12-17 NOTE — Progress Notes (Signed)
Initial Nutrition Assessment  DOCUMENTATION CODES:   Not applicable  INTERVENTION:   Initiate tube feeding via NG tube: Nepro at 25 ml/h, increase by 10 ml every 4 hours to goal rate of 45 (1080 ml per day) Pro-stat 30 ml BID  Provides 2144 kcal, 117 gm protein, 785 ml free water daily  NUTRITION DIAGNOSIS:   Increased nutrient needs related to wound healing, chronic illness(ESRD on HD) as evidenced by estimated needs.  GOAL:   Patient will meet greater than or equal to 90% of their needs  MONITOR:   TF tolerance, Skin, Labs  REASON FOR ASSESSMENT:   Consult Enteral/tube feeding initiation and management  ASSESSMENT:   79 yo female admitted with sepsis, decreased responsiveness. PMH includes ESRD on HD, dementia, DM-2, HTN, hypothyroidism, osteomyelitis.   Patient has been minimally responsive since admission, NPO x 5 days. NG tube placed this morning. Tip is in the stomach per abdominal film. Received MD Consult for TF initiation and management.  She has lost 18% of usual weight within the past 6 months, which is significant for the time frame.   Suspect some form of malnutrition, however, unable to obtain enough information at this time for identification of malnutrition.   Labs reviewed. Sodium 134 (L), creat 2.07 (H) CBGs: 93-107  Medications reviewed and include epogen, protonix, flagyl. IVF: D5 NS at 50 ml/h  NUTRITION - FOCUSED PHYSICAL EXAM:  unable to complete  Diet Order:   Diet Order            Diet NPO time specified  Diet effective now              EDUCATION NEEDS:   Not appropriate for education at this time  Skin:  Skin Assessment: Skin Integrity Issues: Skin Integrity Issues:: DTI, Unstageable, Incisions DTI: L heel Unstageable: sacrum Incisions: non healing surgical wound to back  Last BM:  5/10  Height:   Ht Readings from Last 1 Encounters:  12/12/19 5\' 6"  (1.676 m)    Weight:   Wt Readings from Last 1 Encounters:   12/16/19 79.9 kg    Ideal Body Weight:  59.1 kg  BMI:  Body mass index is 28.43 kg/m.  Estimated Nutritional Needs:   Kcal:  6546-5035  Protein:  100-115 gm  Fluid:  UOP + 1 L    Lucas Mallow, RD, LDN, CNSC Please refer to Amion for contact information.

## 2019-12-17 NOTE — Progress Notes (Signed)
[facility]  ROUNDING NOTE   Subjective:   Opens eyes with verbal stimuli.   Objective:  Vital signs in last 24 hours:  Temp:  [97 F (36.1 C)-98.5 F (36.9 C)] 97.6 F (36.4 C) (05/15 0719) Pulse Rate:  [73-98] 90 (05/15 0800) Resp:  [9-15] 13 (05/15 0800) BP: (111-146)/(50-86) 146/59 (05/15 0800) SpO2:  [94 %-100 %] 99 % (05/15 0800)  Weight change:  Filed Weights   12/15/19 0119 12/15/19 1700 12/16/19 0152  Weight: 79 kg 79 kg 79.9 kg    Intake/Output: I/O last 3 completed shifts: In: 2952.8 [I.V.:1582.1; IV Piggyback:1370.8] Out: 296 [Other:296]   Intake/Output this shift:  Total I/O In: 199.5 [I.V.:99.5; IV Piggyback:100] Out: -   Physical Exam: General: ill-appearing  Head: +NGT  Eyes: Anicteric  Neck: Supple, trachea midline  Lungs:  Clear to auscultation, normal effort  Heart: regular  Abdomen:  +ventral hernia  Extremities: 1+ peripheral edema.  Neurologic: Lethargic, opens eyes to verbal stimuli. No tracking  Skin: Warm/dry, sacral wound not examined (pictures reviewed)  Access: Right IJ PermCath    Basic Metabolic Panel: Recent Labs  Lab 12/12/19 0411 12/12/19 1115 12/13/19 0853 12/13/19 0853 12/14/19 0600 12/15/19 0414 12/16/19 0343 12/16/19 0900 12/17/19 0447  NA 135  --  137  --  134* 131* 136  --   --   K 2.7*   < > 3.1*  --  2.9* 3.4* 2.7* 3.0*  --   CL 106  --  107  --  100 99 101  --   --   CO2 23  --  22  --  26 24 28   --   --   GLUCOSE 180*  --  88  --  77 106* 105*  --   --   BUN 20  --  29*  --  14 16 8   --   --   CREATININE 2.14*  --  2.67*  --  1.80* 2.17* 1.44*  --   --   CALCIUM 7.0*  --  7.5*   < > 7.2* 7.2* 7.3*  --   --   MG 1.4*  --  2.8*  --  2.1 1.9 1.6*  --  2.0  PHOS 1.7*   < > 2.7  --  1.6* 3.5 2.1*  --  3.4   < > = values in this interval not displayed.    Liver Function Tests: Recent Labs  Lab 12/11/19 1945  AST 30  ALT 23  ALKPHOS 90  BILITOT 0.4  PROT 5.1*  ALBUMIN 1.5*   No  results for input(s): LIPASE, AMYLASE in the last 168 hours. Recent Labs  Lab 12/11/19 1945  AMMONIA 44*    CBC: Recent Labs  Lab 12/13/19 0853 12/14/19 0600 12/15/19 0414 12/16/19 0343 12/17/19 0447  WBC 13.8* 8.8 10.1 10.5 9.7  NEUTROABS 11.7* 6.7 8.6* 7.9* 6.6  HGB 8.0* 6.7* 7.3* 7.1* 7.2*  HCT 26.2* 22.6* 24.4* 22.5* 23.9*  MCV 89.4 89.7 89.1 84.6 88.5  PLT 109* 82* 73* 77* 112*    Cardiac Enzymes: No results for input(s): CKTOTAL, CKMB, CKMBINDEX, TROPONINI in the last 168 hours.  BNP: Invalid input(s): POCBNP  CBG: Recent Labs  Lab 12/16/19 1542 12/16/19 2020 12/17/19 0013 12/17/19 0443 12/17/19 0727  GLUCAP 98 129* 109* 97 93    Microbiology: Results for orders placed or performed during the hospital encounter of 12/11/19  Blood Culture (routine x 2)     Status: Abnormal  Collection Time: 12/11/19  7:45 PM   Specimen: BLOOD  Result Value Ref Range Status   Specimen Description   Final    BLOOD RIGHT FA Performed at [facility], [address] [place], [place] [address]    Special Requests   Final    BOTTLES DRAWN AEROBIC AND ANAEROBIC Blood Culture adequate volume Performed at [facility], [address] [place], [place] [address]    Culture  Setup Time   Final    GRAM NEGATIVE RODS IN BOTH AEROBIC AND ANAEROBIC BOTTLES CRITICAL RESULT CALLED TO, READ BACK BY AND VERIFIED WITH: Rayna Sexton AT 2297 ON 12/12/19 SNG    Culture (A)  Final    PROTEUS MIRABILIS CITROBACTER YOUNGAE SUSCEPTIBILITIES PERFORMED ON PREVIOUS CULTURE WITHIN THE LAST 5 DAYS. Performed at [facility], [place] [address] [place], [place] [address]    Report Status 12/16/2019 FINAL  Final  Blood Culture (routine x 2)     Status: Abnormal   Collection Time: 12/11/19  7:45 PM   Specimen: BLOOD  Result Value Ref Range Status   Specimen Description   Final    BLOOD RIGHT ARM Performed at [facility], [address] [place], [place]  [address]    Special Requests   Final    BOTTLES DRAWN AEROBIC AND ANAEROBIC Blood Culture adequate volume Performed at [facility], [address] [place], [place] [address]    Culture  Setup Time   Final    GRAM NEGATIVE RODS IN BOTH AEROBIC AND ANAEROBIC BOTTLES CRITICAL VALUE NOTED.  VALUE IS CONSISTENT WITH PREVIOUSLY REPORTED AND CALLED VALUE. Performed at [facility], [address] [place], [place] [address]    Culture PROTEUS MIRABILIS Su Hoff  (A)  Final   Report Status 12/16/2019 FINAL  Final   Organism ID, Bacteria PROTEUS MIRABILIS  Final   Organism ID, Bacteria CITROBACTER YOUNGAE  Final      Susceptibility   Citrobacter youngae - MIC*    CEFAZOLIN >=64 RESISTANT Resistant     CEFEPIME <=1 SENSITIVE Sensitive     CEFTAZIDIME >=64 RESISTANT Resistant     CEFTRIAXONE >=64 RESISTANT Resistant     CIPROFLOXACIN <=0.25 SENSITIVE Sensitive     GENTAMICIN <=1 SENSITIVE Sensitive     IMIPENEM 1 SENSITIVE Sensitive     TRIMETH/SULFA <=20 SENSITIVE Sensitive     PIP/TAZO 64 INTERMEDIATE Intermediate     * CITROBACTER YOUNGAE   Proteus mirabilis - MIC*    AMPICILLIN <=2 SENSITIVE Sensitive     CEFAZOLIN <=4 SENSITIVE Sensitive     CEFEPIME <=1 SENSITIVE Sensitive     CEFTAZIDIME <=1 SENSITIVE Sensitive     CEFTRIAXONE <=1 SENSITIVE Sensitive     CIPROFLOXACIN <=0.25 SENSITIVE Sensitive     GENTAMICIN <=1 SENSITIVE Sensitive     IMIPENEM 4 SENSITIVE Sensitive     TRIMETH/SULFA <=20 SENSITIVE Sensitive     AMPICILLIN/SULBACTAM <=2 SENSITIVE Sensitive     PIP/TAZO <=4 SENSITIVE Sensitive     * PROTEUS MIRABILIS  Blood Culture ID Panel (Reflexed)     Status: Abnormal   Collection Time: 12/11/19  7:45 PM  Result Value Ref Range Status   Enterococcus species NOT DETECTED NOT DETECTED Final   Listeria monocytogenes NOT DETECTED NOT DETECTED Final   Staphylococcus species NOT DETECTED NOT DETECTED Final   Staphylococcus aureus (BCID) NOT  DETECTED NOT DETECTED Final   Streptococcus species NOT DETECTED NOT DETECTED Final   Streptococcus agalactiae NOT DETECTED  NOT DETECTED Final   Streptococcus pneumoniae NOT DETECTED NOT DETECTED Final   Streptococcus pyogenes NOT DETECTED NOT DETECTED Final   Acinetobacter baumannii NOT DETECTED NOT DETECTED Final   Enterobacteriaceae species DETECTED (A) NOT DETECTED Final    Comment: Enterobacteriaceae represent a large family of gram-negative bacteria, not a single organism. CRITICAL RESULT CALLED TO, READ BACK BY AND VERIFIED WITH: Rayna Sexton AT 6010 ON 12/12/19 SNG    Enterobacter cloacae complex NOT DETECTED NOT DETECTED Final   Escherichia coli NOT DETECTED NOT DETECTED Final   Klebsiella oxytoca NOT DETECTED NOT DETECTED Final   Klebsiella pneumoniae NOT DETECTED NOT DETECTED Final   Proteus species DETECTED (A) NOT DETECTED Final    Comment: CRITICAL RESULT CALLED TO, READ BACK BY AND VERIFIED WITH: Rayna Sexton AT 9323 ON 12/12/19 SNG    Serratia marcescens NOT DETECTED NOT DETECTED Final   Carbapenem resistance NOT DETECTED NOT DETECTED Final   Haemophilus influenzae NOT DETECTED NOT DETECTED Final   Neisseria meningitidis NOT DETECTED NOT DETECTED Final   Pseudomonas aeruginosa NOT DETECTED NOT DETECTED Final   Candida albicans NOT DETECTED NOT DETECTED Final   Candida glabrata NOT DETECTED NOT DETECTED Final   Candida krusei NOT DETECTED NOT DETECTED Final   Candida parapsilosis NOT DETECTED NOT DETECTED Final   Candida tropicalis NOT DETECTED NOT DETECTED Final    Comment: Performed at [facility], [address] [place], [place] [address]  Urine culture     Status: Abnormal   Collection Time: 12/11/19  8:09 PM   Specimen: In/Out Cath Urine  Result Value Ref Range Status   Specimen Description   Final    IN/OUT CATH URINE Performed at [facility], [address], [place], [place] [address]    Special Requests   Final    NONE Performed at  [facility], [address] [place], [place] [address]    Culture MULTIPLE SPECIES PRESENT, SUGGEST RECOLLECTION (A)  Final   Report Status 12/12/2019 FINAL  Final  Respiratory Panel by RT PCR (Flu A&B, Covid) - Nasopharyngeal Swab     Status: None   Collection Time: 12/11/19  9:17 PM   Specimen: Nasopharyngeal Swab  Result Value Ref Range Status   SARS Coronavirus 2 by RT PCR NEGATIVE NEGATIVE Final    Comment: (NOTE) SARS-CoV-2 target nucleic acids are NOT DETECTED. The SARS-CoV-2 RNA is generally detectable in upper respiratoy specimens during the acute phase of infection. The lowest concentration of SARS-CoV-2 viral copies this assay can detect is 131 copies/mL. A negative result does not preclude SARS-Cov-2 infection and should not be used as the sole basis for treatment or other patient management decisions. A negative result may occur with  improper specimen collection/handling, submission of specimen other than nasopharyngeal swab, presence of viral mutation(s) within the areas targeted by this assay, and inadequate number of viral copies (<131 copies/mL). A negative result must be combined with clinical observations, patient history, and epidemiological information. The expected result is Negative. Fact Sheet for Patients:  PinkCheek.be Fact Sheet for Healthcare Providers:  GravelBags.it This test is not yet ap proved or cleared by the [place] FDA and  has been authorized for detection and/or diagnosis of SARS-CoV-2 by FDA under an Emergency Use Authorization (EUA). This EUA will remain  in effect (meaning this test can be used) for the duration of the COVID-19 declaration under Section 564(b)(1) of the Act, 21 U.S.C. section 360bbb-3(b)(1), unless the authorization is terminated or revoked sooner.  Influenza A by PCR NEGATIVE NEGATIVE Final   Influenza B by PCR NEGATIVE NEGATIVE Final     Comment: (NOTE) The Xpert Xpress SARS-CoV-2/FLU/RSV assay is intended as an aid in  the diagnosis of influenza from Nasopharyngeal swab specimens and  should not be used as a sole basis for treatment. Nasal washings and  aspirates are unacceptable for Xpert Xpress SARS-CoV-2/FLU/RSV  testing. Fact Sheet for Patients: PinkCheek.be Fact Sheet for Healthcare Providers: GravelBags.it This test is not yet approved or cleared by the [place] FDA and  has been authorized for detection and/or diagnosis of SARS-CoV-2 by  FDA under an Emergency Use Authorization (EUA). This EUA will remain  in effect (meaning this test can be used) for the duration of the  Covid-19 declaration under Section 564(b)(1) of the Act, 21  U.S.C. section 360bbb-3(b)(1), unless the authorization is  terminated or revoked. Performed at [facility], [address] [place], [place] [address]   MRSA PCR Screening     Status: None   Collection Time: 12/12/19 12:29 AM   Specimen: Nasopharyngeal  Result Value Ref Range Status   MRSA by PCR NEGATIVE NEGATIVE Final    Comment:        The GeneXpert MRSA Assay (FDA approved for NASAL specimens only), is one component of a comprehensive MRSA colonization surveillance program. It is not intended to diagnose MRSA infection nor to guide or monitor treatment for MRSA infections. Performed at [facility], [address] [place], [place] [address]   Aerobic/Anaerobic Culture (surgical/deep wound)     Status: None   Collection Time: 12/12/19  2:06 AM   Specimen: Sacral; Wound  Result Value Ref Range Status   Specimen Description   Final    SACRAL Performed at [facility], [address], [place], [place] [address]    Special Requests   Final    NONE Performed at [facility], [place], [place] [address]    Gram Stain NO WBC SEEN NO ORGANISMS SEEN    Final   Culture   Final    FEW ESCHERICHIA COLI FEW CITROBACTER SPECIES FEW PROTEUS MIRABILIS FEW BACTEROIDES OVATUS BETA LACTAMASE POSITIVE Performed at [facility], [place] [address], [place], [place] [address]    Report Status 12/15/2019 FINAL  Final   Organism ID, Bacteria ESCHERICHIA COLI  Final   Organism ID, Bacteria CITROBACTER SPECIES  Final   Organism ID, Bacteria PROTEUS MIRABILIS  Final      Susceptibility   Citrobacter species - MIC*    CEFAZOLIN >=64 RESISTANT Resistant     CEFEPIME <=1 SENSITIVE Sensitive     CEFTAZIDIME >=64 RESISTANT Resistant     CEFTRIAXONE >=64 RESISTANT Resistant     CIPROFLOXACIN <=0.25 SENSITIVE Sensitive     GENTAMICIN <=1 SENSITIVE Sensitive     IMIPENEM <=0.25 SENSITIVE Sensitive     TRIMETH/SULFA <=20 SENSITIVE Sensitive     PIP/TAZO 64 INTERMEDIATE Intermediate     * FEW CITROBACTER SPECIES   Escherichia coli - MIC*    AMPICILLIN 16 INTERMEDIATE Intermediate     CEFAZOLIN >=64 RESISTANT Resistant     CEFEPIME <=1 SENSITIVE Sensitive     CEFTAZIDIME <=1 SENSITIVE Sensitive     CEFTRIAXONE <=1 SENSITIVE Sensitive     CIPROFLOXACIN <=0.25 SENSITIVE Sensitive     GENTAMICIN <=1 SENSITIVE Sensitive     IMIPENEM <=0.25 SENSITIVE Sensitive     TRIMETH/SULFA <=20 SENSITIVE Sensitive     AMPICILLIN/SULBACTAM 4 SENSITIVE  Sensitive     PIP/TAZO <=4 SENSITIVE Sensitive     * FEW ESCHERICHIA COLI   Proteus mirabilis - MIC*    AMPICILLIN <=2 SENSITIVE Sensitive     CEFAZOLIN <=4 SENSITIVE Sensitive     CEFEPIME <=1 SENSITIVE Sensitive     CEFTAZIDIME <=1 SENSITIVE Sensitive     CEFTRIAXONE <=1 SENSITIVE Sensitive     CIPROFLOXACIN <=0.25 SENSITIVE Sensitive     GENTAMICIN 2 SENSITIVE Sensitive     IMIPENEM 4 SENSITIVE Sensitive     TRIMETH/SULFA <=20 SENSITIVE Sensitive     AMPICILLIN/SULBACTAM <=2 SENSITIVE Sensitive     PIP/TAZO <=4 SENSITIVE Sensitive     * FEW PROTEUS MIRABILIS  Aerobic/Anaerobic Culture (surgical/deep wound)      Status: None (Preliminary result)   Collection Time: 12/12/19  2:06 AM   Specimen: Back; Wound  Result Value Ref Range Status   Specimen Description   Final    BACK LUMBAR SPINE Performed at [facility], [address], [place], [place] [address]    Special Requests   Final    NONE Performed at [facility], [address] [place], [place] [address]    Gram Stain   Final    NO WBC SEEN NO ORGANISMS SEEN Performed at [facility], [place] [address] [place],  [address]    Culture   Final    FEW PROTEUS MIRABILIS NO ANAEROBES ISOLATED; CULTURE IN PROGRESS FOR 5 DAYS    Report Status PENDING  Incomplete   Organism ID, Bacteria PROTEUS MIRABILIS  Final      Susceptibility   Proteus mirabilis - MIC*    AMPICILLIN <=2 SENSITIVE Sensitive     CEFAZOLIN <=4 SENSITIVE Sensitive     CEFEPIME <=1 SENSITIVE Sensitive     CEFTAZIDIME <=1 SENSITIVE Sensitive     CEFTRIAXONE <=1 SENSITIVE Sensitive     CIPROFLOXACIN <=0.25 SENSITIVE Sensitive     GENTAMICIN 2 SENSITIVE Sensitive     IMIPENEM 4 SENSITIVE Sensitive     TRIMETH/SULFA <=20 SENSITIVE Sensitive     AMPICILLIN/SULBACTAM <=2 SENSITIVE Sensitive     PIP/TAZO <=4 SENSITIVE Sensitive     * FEW PROTEUS MIRABILIS    Coagulation Studies: No results for input(s): LABPROT, INR in the last 72 hours.  Urinalysis: No results for input(s): COLORURINE, LABSPEC, PHURINE, GLUCOSEU, HGBUR, BILIRUBINUR, KETONESUR, PROTEINUR, UROBILINOGEN, NITRITE, LEUKOCYTESUR in the last 72 hours.  Invalid input(s): APPERANCEUR    Imaging: MR BRAIN WO CONTRAST  Result Date: 12/16/2019 CLINICAL DATA:  Encephalopathy. Stroke suspected. EXAM: MRI HEAD WITHOUT CONTRAST TECHNIQUE: Multiplanar, multiecho pulse sequences of the brain and surrounding structures were obtained without intravenous contrast. COMPARISON:  Head CT 12/11/2019 and MRI 09/08/2019 FINDINGS: Brain: No acute infarct, mass, midline shift, or extra-axial  fluid collection is identified. Chronic microhemorrhages in the left parietal and right occipital lobes are unchanged. T2 hyperintensities in the cerebral white matter bilaterally are unchanged and nonspecific but compatible with mild chronic small vessel ischemic disease. Mild-to-moderate cerebral atrophy is again noted. Vascular: Major intracranial vascular flow voids are preserved. Skull and upper cervical spine: Unremarkable bone marrow signal. Sinuses/Orbits: Right cataract extraction. Minimal fluid in the sphenoid sinuses. Clear mastoid air cells. Other: None. IMPRESSION: 1. No acute intracranial abnormality. 2. Mild chronic small vessel ischemic disease and mild-to-moderate cerebral atrophy. Electronically Signed   By: Logan Bores M.D.   On: 12/16/2019 14:49     Medications:   . sodium chloride Stopped (12/12/19 2140)  . sodium chloride    .  sodium chloride    . albumin human Stopped (12/15/19 1209)  . ceFEPime (MAXIPIME) IV Stopped (12/16/19 2009)  . dextrose 5 % and 0.9% NaCl 50 mL/hr at 12/17/19 0800  . metronidazole Stopped (12/17/19 0700)   . chlorhexidine  15 mL Mouth Rinse BID  . Chlorhexidine Gluconate Cloth  6 each Topical Q0600  . epoetin (EPOGEN/PROCRIT) injection  10,000 Units Intravenous Q T,Th,Sa-HD  . heparin  5,000 Units Subcutaneous Q8H  . mouth rinse  15 mL Mouth Rinse q12n4p  . pantoprazole (PROTONIX) IV  40 mg Intravenous Q24H  . sodium chloride flush  10-40 mL Intracatheter Q12H   sodium chloride, sodium chloride, acetaminophen, alteplase, budesonide (PULMICORT) nebulizer solution, docusate sodium, heparin, ipratropium-albuterol, ipratropium-albuterol, lidocaine (PF), lidocaine-prilocaine, morphine injection, ondansetron (ZOFRAN) IV, pentafluoroprop-tetrafluoroeth, polyethylene glycol, sodium chloride flush  Assessment/ Plan:  79 y.o. female  is a 79 y.o. black female with end stage renal disease on hemodialysis, dementia, diabetes mellitus type II, hypertension,  hypothyroidism, osteomyelitis, now admitted with decreased responsiveness and sepsis.  [facility] ([place]) [facility] [address] TTS RIJ [phone].  1.  ESRD on HD TTS.   Hemodialysis for later today.   2.  Altered mental status: MRI negative. On cefepime, consider changing.   3.  Anemia of chronic kidney disease.  Hemoglobin 7.2 - EPO with HD treatment.   4.  Secondary hyperparathyroidism: PTH and phosphorus are low. Corrected calcium at goal.   5.  Sepsis: proteus and citrobacter in blood culture. Most likely urinary sources.  Off vasopressors - cefepime and metronidazole.   Overall prognosis is very poor despite aggressive measures.    LOS: 6 Taylor Tyler 5/15/20219:28 AM

## 2019-12-17 NOTE — Progress Notes (Signed)
[name], female   DOB: 1941/05/31, 79 y.o.   MRN: [id number] [facility] PROGRESS NOTE  Taylor Tyler QBH:[id number] DOB: 1940-09-09 DOA: 12/11/2019 PCP: McLean-Scocuzza, Nino Glow, MD  HPI/Subjective: Patient resisted me opening her eyes.  Unresponsive to sternal rub or blood draw.  Son states things got worse after they remove hardware from her back recently.  He also states that things got worse once they started dialysis.  Patient has had this wound for years on her buttock.  Objective: Vitals:   12/17/19 1315 12/17/19 1330  BP: 97/63 (!) 111/59  Pulse: (!) 103 (!) 101  Resp: 15 12  Temp:    SpO2: 100% 100%    Intake/Output Summary (Last 24 hours) at 12/17/2019 1343 Last data filed at 12/17/2019 1100 Gross per 24 hour  Intake 1876.55 ml  Output --  Net 1876.55 ml   Filed Weights   12/15/19 0119 12/15/19 1700 12/16/19 0152  Weight: 79 kg 79 kg 79.9 kg    ROS: Review of Systems  Unable to perform ROS: Acuity of condition   Exam: Physical Exam  HENT:  Nose: No mucosal edema.  Eyes:  Patient resisted me opening her eyes.  When I did get her right I opened it was rolled back in her head.  Neck: Carotid bruit is not present.  Cardiovascular: Regular rhythm, S1 normal, S2 normal and normal heart sounds.  Respiratory: No respiratory distress. She has decreased breath sounds in the right lower field and the left lower field. She has no wheezes. She has no rhonchi. She has no rales.  GI: Soft. Bowel sounds are normal. There is no abdominal tenderness.  Musculoskeletal:     Right ankle: Swelling present.     Left ankle: Swelling present.  Lymphadenopathy:    She has no cervical adenopathy.  Neurological:  Unresponsive to sternal rub  Skin: Skin is warm.  As per pictures large stage IV decubitus on buttocks  Psychiatric:  Unable to test      Data Reviewed: Basic Metabolic Panel: Recent Labs  Lab 12/13/19 0853 12/13/19 0853 12/14/19 0600  12/15/19 0414 12/16/19 0343 12/16/19 0900 12/17/19 0447 12/17/19 1131  NA 137  --  134* 131* 136  --   --  134*  K 3.1*   < > 2.9* 3.4* 2.7* 3.0*  --  3.6  CL 107  --  100 99 101  --   --  103  CO2 22  --  26 24 28   --   --  24  GLUCOSE 88  --  77 106* 105*  --   --  117*  BUN 29*  --  14 16 8   --   --  10  CREATININE 2.67*  --  1.80* 2.17* 1.44*  --   --  2.07*  CALCIUM 7.5*  --  7.2* 7.2* 7.3*  --   --  7.6*  MG 2.8*  --  2.1 1.9 1.6*  --  2.0  --   PHOS 2.7   < > 1.6* 3.5 2.1*  --  3.4 3.7   < > = values in this interval not displayed.   Liver Function Tests: Recent Labs  Lab 12/11/19 1945 12/17/19 1131  AST 30  --   ALT 23  --   ALKPHOS 90  --   BILITOT 0.4  --   PROT 5.1*  --   ALBUMIN 1.5* 1.7*    Recent Labs  Lab 12/11/19 1945  AMMONIA  44*   CBC: Recent Labs  Lab 12/13/19 0853 12/13/19 0853 12/14/19 0600 12/15/19 0414 12/16/19 0343 12/17/19 0447 12/17/19 1131  WBC 13.8*   < > 8.8 10.1 10.5 9.7 13.2*  NEUTROABS 11.7*  --  6.7 8.6* 7.9* 6.6  --   HGB 8.0*   < > 6.7* 7.3* 7.1* 7.2* 7.4*  HCT 26.2*   < > 22.6* 24.4* 22.5* 23.9* 24.7*  MCV 89.4   < > 89.7 89.1 84.6 88.5 89.2  PLT 109*   < > 82* 73* 77* 112* 133*   < > = values in this interval not displayed.   BNP (last 3 results) Recent Labs    05/30/19 2056  BNP 851.4*    CBG: Recent Labs  Lab 12/16/19 2020 12/17/19 0013 12/17/19 0443 12/17/19 0727 12/17/19 1147  GLUCAP 129* 109* 97 93 107*    Recent Results (from the past 240 hour(s))  Blood Culture (routine x 2)     Status: Abnormal   Collection Time: 12/11/19  7:45 PM   Specimen: BLOOD  Result Value Ref Range Status   Specimen Description   Final    BLOOD RIGHT FA Performed at [facility], [address] [place], [place] [address]    Special Requests   Final    BOTTLES DRAWN AEROBIC AND ANAEROBIC Blood Culture adequate volume Performed at [facility], [address] [place], [place] [address]     Culture  Setup Time   Final    GRAM NEGATIVE RODS IN BOTH AEROBIC AND ANAEROBIC BOTTLES CRITICAL RESULT CALLED TO, READ BACK BY AND VERIFIED WITH: Rayna Sexton AT 8850 ON 12/12/19 SNG    Culture (A)  Final    PROTEUS MIRABILIS CITROBACTER YOUNGAE SUSCEPTIBILITIES PERFORMED ON PREVIOUS CULTURE WITHIN THE LAST 5 DAYS. Performed at [facility], [place] [address], [place], [place] [address]    Report Status 12/16/2019 FINAL  Final  Blood Culture (routine x 2)     Status: Abnormal   Collection Time: 12/11/19  7:45 PM   Specimen: BLOOD  Result Value Ref Range Status   Specimen Description   Final    BLOOD RIGHT ARM Performed at [facility], [address], [place], [place] [address]    Special Requests   Final    BOTTLES DRAWN AEROBIC AND ANAEROBIC Blood Culture adequate volume Performed at [facility], [address], [place], [place] [address]    Culture  Setup Time   Final    GRAM NEGATIVE RODS IN BOTH AEROBIC AND ANAEROBIC BOTTLES CRITICAL VALUE NOTED.  VALUE IS CONSISTENT WITH PREVIOUSLY REPORTED AND CALLED VALUE. Performed at [facility], [address] [place], [place] [address]    Culture PROTEUS MIRABILIS Su Hoff  (A)  Final   Report Status 12/16/2019 FINAL  Final   Organism ID, Bacteria PROTEUS MIRABILIS  Final   Organism ID, Bacteria CITROBACTER YOUNGAE  Final      Susceptibility   Citrobacter youngae - MIC*    CEFAZOLIN >=64 RESISTANT Resistant     CEFEPIME <=1 SENSITIVE Sensitive     CEFTAZIDIME >=64 RESISTANT Resistant     CEFTRIAXONE >=64 RESISTANT Resistant     CIPROFLOXACIN <=0.25 SENSITIVE Sensitive     GENTAMICIN <=1 SENSITIVE Sensitive     IMIPENEM 1 SENSITIVE Sensitive     TRIMETH/SULFA <=20 SENSITIVE Sensitive     PIP/TAZO 64 INTERMEDIATE Intermediate     * CITROBACTER YOUNGAE   Proteus mirabilis - MIC*  AMPICILLIN <=2 SENSITIVE Sensitive     CEFAZOLIN <=4 SENSITIVE Sensitive     CEFEPIME <=1  SENSITIVE Sensitive     CEFTAZIDIME <=1 SENSITIVE Sensitive     CEFTRIAXONE <=1 SENSITIVE Sensitive     CIPROFLOXACIN <=0.25 SENSITIVE Sensitive     GENTAMICIN <=1 SENSITIVE Sensitive     IMIPENEM 4 SENSITIVE Sensitive     TRIMETH/SULFA <=20 SENSITIVE Sensitive     AMPICILLIN/SULBACTAM <=2 SENSITIVE Sensitive     PIP/TAZO <=4 SENSITIVE Sensitive     * PROTEUS MIRABILIS  Blood Culture ID Panel (Reflexed)     Status: Abnormal   Collection Time: 12/11/19  7:45 PM  Result Value Ref Range Status   Enterococcus species NOT DETECTED NOT DETECTED Final   Listeria monocytogenes NOT DETECTED NOT DETECTED Final   Staphylococcus species NOT DETECTED NOT DETECTED Final   Staphylococcus aureus (BCID) NOT DETECTED NOT DETECTED Final   Streptococcus species NOT DETECTED NOT DETECTED Final   Streptococcus agalactiae NOT DETECTED NOT DETECTED Final   Streptococcus pneumoniae NOT DETECTED NOT DETECTED Final   Streptococcus pyogenes NOT DETECTED NOT DETECTED Final   Acinetobacter baumannii NOT DETECTED NOT DETECTED Final   Enterobacteriaceae species DETECTED (A) NOT DETECTED Final    Comment: Enterobacteriaceae represent a large family of gram-negative bacteria, not a single organism. CRITICAL RESULT CALLED TO, READ BACK BY AND VERIFIED WITH: Rayna Sexton AT 3734 ON 12/12/19 SNG    Enterobacter cloacae complex NOT DETECTED NOT DETECTED Final   Escherichia coli NOT DETECTED NOT DETECTED Final   Klebsiella oxytoca NOT DETECTED NOT DETECTED Final   Klebsiella pneumoniae NOT DETECTED NOT DETECTED Final   Proteus species DETECTED (A) NOT DETECTED Final    Comment: CRITICAL RESULT CALLED TO, READ BACK BY AND VERIFIED WITH: Rayna Sexton AT 2876 ON 12/12/19 SNG    Serratia marcescens NOT DETECTED NOT DETECTED Final   Carbapenem resistance NOT DETECTED NOT DETECTED Final   Haemophilus influenzae NOT DETECTED NOT DETECTED Final   Neisseria meningitidis NOT DETECTED NOT DETECTED Final   Pseudomonas aeruginosa  NOT DETECTED NOT DETECTED Final   Candida albicans NOT DETECTED NOT DETECTED Final   Candida glabrata NOT DETECTED NOT DETECTED Final   Candida krusei NOT DETECTED NOT DETECTED Final   Candida parapsilosis NOT DETECTED NOT DETECTED Final   Candida tropicalis NOT DETECTED NOT DETECTED Final    Comment: Performed at [facility], [address] [place], [place] [address]  Urine culture     Status: Abnormal   Collection Time: 12/11/19  8:09 PM   Specimen: In/Out Cath Urine  Result Value Ref Range Status   Specimen Description   Final    IN/OUT CATH URINE Performed at [facility], [address], [place], [place] [address]    Special Requests   Final    NONE Performed at [facility], [address] [place], [place] [address]    Culture MULTIPLE SPECIES PRESENT, SUGGEST RECOLLECTION (A)  Final   Report Status 12/12/2019 FINAL  Final  Respiratory Panel by RT PCR (Flu A&B, Covid) - Nasopharyngeal Swab     Status: None   Collection Time: 12/11/19  9:17 PM   Specimen: Nasopharyngeal Swab  Result Value Ref Range Status   SARS Coronavirus 2 by RT PCR NEGATIVE NEGATIVE Final    Comment: (NOTE) SARS-CoV-2 target nucleic acids are NOT DETECTED. The SARS-CoV-2 RNA is generally detectable in upper respiratoy specimens during the acute phase of infection. The lowest concentration of SARS-CoV-2 viral copies this  assay can detect is 131 copies/mL. A negative result does not preclude SARS-Cov-2 infection and should not be used as the sole basis for treatment or other patient management decisions. A negative result may occur with  improper specimen collection/handling, submission of specimen other than nasopharyngeal swab, presence of viral mutation(s) within the areas targeted by this assay, and inadequate number of viral copies (<131 copies/mL). A negative result must be combined with clinical observations, patient history, and epidemiological information.  The expected result is Negative. Fact Sheet for Patients:  PinkCheek.be Fact Sheet for Healthcare Providers:  GravelBags.it This test is not yet ap proved or cleared by the [place] FDA and  has been authorized for detection and/or diagnosis of SARS-CoV-2 by FDA under an Emergency Use Authorization (EUA). This EUA will remain  in effect (meaning this test can be used) for the duration of the COVID-19 declaration under Section 564(b)(1) of the Act, 21 U.S.C. section 360bbb-3(b)(1), unless the authorization is terminated or revoked sooner.    Influenza A by PCR NEGATIVE NEGATIVE Final   Influenza B by PCR NEGATIVE NEGATIVE Final    Comment: (NOTE) The Xpert Xpress SARS-CoV-2/FLU/RSV assay is intended as an aid in  the diagnosis of influenza from Nasopharyngeal swab specimens and  should not be used as a sole basis for treatment. Nasal washings and  aspirates are unacceptable for Xpert Xpress SARS-CoV-2/FLU/RSV  testing. Fact Sheet for Patients: PinkCheek.be Fact Sheet for Healthcare Providers: GravelBags.it This test is not yet approved or cleared by the [place] FDA and  has been authorized for detection and/or diagnosis of SARS-CoV-2 by  FDA under an Emergency Use Authorization (EUA). This EUA will remain  in effect (meaning this test can be used) for the duration of the  Covid-19 declaration under Section 564(b)(1) of the Act, 21  U.S.C. section 360bbb-3(b)(1), unless the authorization is  terminated or revoked. Performed at [facility], [address] [place], [place] [address]   MRSA PCR Screening     Status: None   Collection Time: 12/12/19 12:29 AM   Specimen: Nasopharyngeal  Result Value Ref Range Status   MRSA by PCR NEGATIVE NEGATIVE Final    Comment:        The GeneXpert MRSA Assay (FDA approved for NASAL specimens only), is one  component of a comprehensive MRSA colonization surveillance program. It is not intended to diagnose MRSA infection nor to guide or monitor treatment for MRSA infections. Performed at [facility], [address] [place], [place] [address]   Aerobic/Anaerobic Culture (surgical/deep wound)     Status: None   Collection Time: 12/12/19  2:06 AM   Specimen: Sacral; Wound  Result Value Ref Range Status   Specimen Description   Final    SACRAL Performed at [facility], [address] [place], [place] [address]    Special Requests   Final    NONE Performed at [facility], [place],  [address]    Gram Stain NO WBC SEEN NO ORGANISMS SEEN   Final   Culture   Final    FEW ESCHERICHIA COLI FEW CITROBACTER SPECIES FEW PROTEUS MIRABILIS FEW BACTEROIDES OVATUS BETA LACTAMASE POSITIVE Performed at [facility], [place] [address], [place],  [address]    Report Status 12/15/2019 FINAL  Final   Organism ID, Bacteria ESCHERICHIA COLI  Final   Organism ID, Bacteria CITROBACTER SPECIES  Final   Organism ID, Bacteria PROTEUS MIRABILIS  Final  Susceptibility   Citrobacter species - MIC*    CEFAZOLIN >=64 RESISTANT Resistant     CEFEPIME <=1 SENSITIVE Sensitive     CEFTAZIDIME >=64 RESISTANT Resistant     CEFTRIAXONE >=64 RESISTANT Resistant     CIPROFLOXACIN <=0.25 SENSITIVE Sensitive     GENTAMICIN <=1 SENSITIVE Sensitive     IMIPENEM <=0.25 SENSITIVE Sensitive     TRIMETH/SULFA <=20 SENSITIVE Sensitive     PIP/TAZO 64 INTERMEDIATE Intermediate     * FEW CITROBACTER SPECIES   Escherichia coli - MIC*    AMPICILLIN 16 INTERMEDIATE Intermediate     CEFAZOLIN >=64 RESISTANT Resistant     CEFEPIME <=1 SENSITIVE Sensitive     CEFTAZIDIME <=1 SENSITIVE Sensitive     CEFTRIAXONE <=1 SENSITIVE Sensitive     CIPROFLOXACIN <=0.25 SENSITIVE Sensitive     GENTAMICIN <=1 SENSITIVE Sensitive     IMIPENEM <=0.25 SENSITIVE Sensitive      TRIMETH/SULFA <=20 SENSITIVE Sensitive     AMPICILLIN/SULBACTAM 4 SENSITIVE Sensitive     PIP/TAZO <=4 SENSITIVE Sensitive     * FEW ESCHERICHIA COLI   Proteus mirabilis - MIC*    AMPICILLIN <=2 SENSITIVE Sensitive     CEFAZOLIN <=4 SENSITIVE Sensitive     CEFEPIME <=1 SENSITIVE Sensitive     CEFTAZIDIME <=1 SENSITIVE Sensitive     CEFTRIAXONE <=1 SENSITIVE Sensitive     CIPROFLOXACIN <=0.25 SENSITIVE Sensitive     GENTAMICIN 2 SENSITIVE Sensitive     IMIPENEM 4 SENSITIVE Sensitive     TRIMETH/SULFA <=20 SENSITIVE Sensitive     AMPICILLIN/SULBACTAM <=2 SENSITIVE Sensitive     PIP/TAZO <=4 SENSITIVE Sensitive     * FEW PROTEUS MIRABILIS  Aerobic/Anaerobic Culture (surgical/deep wound)     Status: None   Collection Time: 12/12/19  2:06 AM   Specimen: Back; Wound  Result Value Ref Range Status   Specimen Description   Final    BACK LUMBAR SPINE Performed at [facility], [address] [place], [place] [address]    Special Requests   Final    NONE Performed at [facility], [place], [place] [address]    Gram Stain NO WBC SEEN NO ORGANISMS SEEN   Final   Culture   Final    FEW PROTEUS MIRABILIS NO ANAEROBES ISOLATED Performed at [facility], [place] [address] [place], [place] [address]    Report Status 12/17/2019 FINAL  Final   Organism ID, Bacteria PROTEUS MIRABILIS  Final      Susceptibility   Proteus mirabilis - MIC*    AMPICILLIN <=2 SENSITIVE Sensitive     CEFAZOLIN <=4 SENSITIVE Sensitive     CEFEPIME <=1 SENSITIVE Sensitive     CEFTAZIDIME <=1 SENSITIVE Sensitive     CEFTRIAXONE <=1 SENSITIVE Sensitive     CIPROFLOXACIN <=0.25 SENSITIVE Sensitive     GENTAMICIN 2 SENSITIVE Sensitive     IMIPENEM 4 SENSITIVE Sensitive     TRIMETH/SULFA <=20 SENSITIVE Sensitive     AMPICILLIN/SULBACTAM <=2 SENSITIVE Sensitive     PIP/TAZO <=4 SENSITIVE Sensitive     * FEW PROTEUS MIRABILIS     Studies: MR BRAIN WO  CONTRAST  Result Date: 12/16/2019 CLINICAL DATA:  Encephalopathy. Stroke suspected. EXAM: MRI HEAD WITHOUT CONTRAST TECHNIQUE: Multiplanar, multiecho pulse sequences of the brain and surrounding structures were obtained without intravenous contrast. COMPARISON:  Head CT 12/11/2019 and MRI 09/08/2019 FINDINGS: Brain: No acute infarct, mass, midline shift, or extra-axial fluid collection is identified. Chronic microhemorrhages  in the left parietal and right occipital lobes are unchanged. T2 hyperintensities in the cerebral white matter bilaterally are unchanged and nonspecific but compatible with mild chronic small vessel ischemic disease. Mild-to-moderate cerebral atrophy is again noted. Vascular: Major intracranial vascular flow voids are preserved. Skull and upper cervical spine: Unremarkable bone marrow signal. Sinuses/Orbits: Right cataract extraction. Minimal fluid in the sphenoid sinuses. Clear mastoid air cells. Other: None. IMPRESSION: 1. No acute intracranial abnormality. 2. Mild chronic small vessel ischemic disease and mild-to-moderate cerebral atrophy. Electronically Signed   By: Logan Bores M.D.   On: 12/16/2019 14:49   DG Abd Portable 1V  Result Date: 12/17/2019 CLINICAL DATA:  NG tube placement. EXAM: PORTABLE ABDOMEN - 1 VIEW COMPARISON:  CT, 12/11/2019. FINDINGS: Nasogastric tube passes below the diaphragm, tip projects in the mid stomach. No bowel dilation. IMPRESSION: Nasal/orogastric tube tip projects in the mid stomach. Electronically Signed   By: Lajean Manes M.D.   On: 12/17/2019 10:37    Scheduled Meds: . chlorhexidine  15 mL Mouth Rinse BID  . Chlorhexidine Gluconate Cloth  6 each Topical Q0600  . epoetin (EPOGEN/PROCRIT) injection  10,000 Units Intravenous Q T,Th,Sa-HD  . heparin  5,000 Units Subcutaneous Q8H  . mouth rinse  15 mL Mouth Rinse q12n4p  . pantoprazole (PROTONIX) IV  40 mg Intravenous Q24H  . sodium chloride flush  10-40 mL Intracatheter Q12H   Continuous  Infusions: . sodium chloride Stopped (12/12/19 2140)  . sodium chloride    . sodium chloride    . albumin human Stopped (12/15/19 1209)  . ceFEPime (MAXIPIME) IV Stopped (12/16/19 2009)  . dextrose 5 % and 0.9% NaCl 50 mL/hr at 12/17/19 1100  . metronidazole Stopped (12/17/19 0700)    Assessment/Plan:  1. Septic shock present on admission.  Blood cultures positive for Proteus mirabilis and Citrobacter.  Patient on Maxipime and Flagyl.  Source likely stage IV decubitus.  Critical care team signed out to medical team.  Looks like infectious disease spoke with IR and will consider an IR drain on Monday. 2. Acute metabolic encephalopathy.  Likely from sepsis.  Underlying dementia. 3. Failure to thrive.  NG tube placed for tube feeding. 4. End-stage renal disease on hemodialysis Tuesday Thursday Saturday.  Having dialysis today. 5. Anemia of chronic disease.  Continue to monitor hemoglobin.  Pressure Injury 11/19/19 Sacrum Mid;Right Unstageable - Full thickness tissue loss in which the base of the injury is covered by slough (yellow, tan, gray, green or brown) and/or eschar (tan, brown or black) in the wound bed. deep wound with bed covered  (Active)  11/19/19 1800  Location: Sacrum  Location Orientation: Mid;Right  Staging: Unstageable - Full thickness tissue loss in which the base of the injury is covered by slough (yellow, tan, gray, green or brown) and/or eschar (tan, brown or black) in the wound bed.  Wound Description (Comments): deep wound with bed covered by slough  Present on Admission: Yes     Pressure Injury 11/19/19 Heel Left Deep Tissue Pressure Injury - Purple or maroon localized area of discolored intact skin or blood-filled blister due to damage of underlying soft tissue from pressure and/or shear. (Active)  11/19/19 1800  Location: Heel  Location Orientation: Left  Staging: Deep Tissue Pressure Injury - Purple or maroon localized area of discolored intact skin or  blood-filled blister due to damage of underlying soft tissue from pressure and/or shear.  Wound Description (Comments):   Present on Admission: Yes       Code  Status:     Code Status Orders  (From admission, onward)         Start     Ordered   12/11/19 2044  Full code  Continuous     12/11/19 2048        Code Status History    Date Active Date Inactive Code Status Order ID Comments User Context   11/19/2019 1527 11/23/2019 2115 Full Code [id number]  Collier Bullock, MD ED   11/03/2019 1234 11/09/2019 2034 Full Code [id number]  Collier Bullock, MD ED   05/31/2019 0016 07/08/2019 1758 Full Code [id number]  Rise Patience, MD ED   06/05/2018 1409 06/12/2018 1255 Full Code [id number]  Thurnell Lose, MD Inpatient   06/05/2018 1358 06/05/2018 1409 Full Code [id number]  Thurnell Lose, MD Inpatient   06/04/2018 0559 06/05/2018 1326 Full Code [id number]  Harrie Foreman, MD Inpatient   03/29/2017 2104 04/07/2017 2125 Full Code [id number]  Everrett Coombe, MD Inpatient   02/24/2017 1450 03/03/2017 1532 Full Code [id number]  Traci Sermon, PA-C Inpatient   02/24/2017 1450 02/24/2017 1450 Full Code [id number]  Elpidio Eric Inpatient   01/15/2017 1145 01/16/2017 2314 Full Code [id number]  Samella Parr, NP Inpatient   07/06/2016 1908 07/10/2016 1736 Full Code [id number]  Nicholes Mango, MD Inpatient   Advance Care Planning Activity     Family Communication: Spoke with family Disposition Plan: Status is: Inpatient  Dispo: The patient is from: Acute rehab              Anticipated d/c is to: Potentially back to rehab              Anticipated d/c date is: Unknown at this point              Patient currently not stable enough for disposition at this point time.  The patient is still unresponsive.  Everything will key off her mental status on whether she will be able to eat.  Patient will need long-term antibiotics.  Patient is high risk for cardiopulmonary  arrest.  Consultants:  Nephrology  Infectious disease  Palliative care  Antibiotics:  Cefepime  Flagyl  Time spent: 28 minutes  [place]

## 2019-12-17 NOTE — Progress Notes (Signed)
CRITICAL CARE NOTE 79 yo female with ESRD on HD admitted with acute toxic metabolic encephalopathy secondary to severe sepsis in the setting of UTI, chronic nonhealing sacral decubitus ulcer CT suggestive of osteomyelitis along with L4 vertebral body postoperative collection/abscess requiring levophed gtt   SIGNIFICANT EVENTS/STUDIES:  05/9: Pt presented to [facility] ER minimally responsive and hypoxic.   05/9: Pt admitted to ICU with septic shock requiring levophed gtt with improvement in mentation and hypoxia resolved 05/9: CT Head revealed no acute intracranial abnormality. Mild to moderate severity cerebral atrophy and microvascular disease changes of the supratentorial brain. Mild sphenoid sinus disease. 05/9: CT Abd/Pelvis revealed extensive postoperative changes within the lumbar spine, as described above, with a 6.2 cm x 4.5 cm area of air and fluid attenuation seen posterior to the spinal canal at the level of the L4 vertebral body. This may represent a postoperative collection/abscess. Large, ventral hernias, as described above. Sacral decubitus ulcer with additional findings suggestive of acute osteomyelitis. Stable 6 mm nonobstructing renal stone within the left kidney. Sigmoid diverticulosis.  5/10 remains on pressors, slight increased WOB, lethargic 5/13-5/14 off vasopressors, palliative has met with family Lethargic, unable to provide ROS  CC  Follow up sepsis  HPI Off pressors NG placed Poor prognosis MRI NEG Remains full code Will follow along in consultation      BP (!) 140/50   Pulse 82   Temp 97.6 F (36.4 C) (Axillary)   Resp 14   Ht '5\' 6"'  (1.676 m)   Wt 79.9 kg   SpO2 100%   BMI 28.43 kg/m    I/O last 3 completed shifts: In: 2952.8 [I.V.:1582.1; IV Piggyback:1370.8] Out: 296 [Other:296] No intake/output data recorded.  SpO2: 100 % O2 Flow Rate (L/min): 2 L/min  Estimated body mass index is 28.43 kg/m as calculated from the following:   Height as of  this encounter: '5\' 6"'  (1.676 m).   Weight as of this encounter: 79.9 kg.  ROS limited due to encephalopathy    Pressure Injury 11/19/19 Sacrum Mid;Right Unstageable - Full thickness tissue loss in which the base of the injury is covered by slough (yellow, tan, gray, green or brown) and/or eschar (tan, brown or black) in the wound bed. deep wound with bed covered  (Active)  11/19/19 1800  Location: Sacrum  Location Orientation: Mid;Right  Staging: Unstageable - Full thickness tissue loss in which the base of the injury is covered by slough (yellow, tan, gray, green or brown) and/or eschar (tan, brown or black) in the wound bed.  Wound Description (Comments): deep wound with bed covered by slough  Present on Admission: Yes     Pressure Injury 11/19/19 Heel Left Deep Tissue Pressure Injury - Purple or maroon localized area of discolored intact skin or blood-filled blister due to damage of underlying soft tissue from pressure and/or shear. (Active)  11/19/19 1800  Location: Heel  Location Orientation: Left  Staging: Deep Tissue Pressure Injury - Purple or maroon localized area of discolored intact skin or blood-filled blister due to damage of underlying soft tissue from pressure and/or shear.  Wound Description (Comments):   Present on Admission: Yes    Physical Examination:   General Appearance: No distress  HEENT: PERRLA, EOM intact.   Pulmonary: normal breath sounds, No wheezing.  CardiovascularNormal S1,S2.  No m/r/g.   Abdomen: Benign, Soft, non-tender. Renal:  No costovertebral tenderness  GU:  Not performed at this time. Endoc: No evident thyromegaly Skin:   warm, no rashes, no ecchymosis  +  SACRAL WOUND ALL OTHER ROS ARE NEGATIVE    MEDICATIONS: I have reviewed all medications and confirmed regimen as documented   CULTURE RESULTS   Recent Results (from the past 240 hour(s))  Blood Culture (routine x 2)     Status: Abnormal   Collection Time: 12/11/19  7:45 PM    Specimen: BLOOD  Result Value Ref Range Status   Specimen Description   Final    BLOOD RIGHT FA Performed at [facility], [address], [place], [place] [address]    Special Requests   Final    BOTTLES DRAWN AEROBIC AND ANAEROBIC Blood Culture adequate volume Performed at [facility], [address] [place], [place] [address]    Culture  Setup Time   Final    GRAM NEGATIVE RODS IN BOTH AEROBIC AND ANAEROBIC BOTTLES CRITICAL RESULT CALLED TO, READ BACK BY AND VERIFIED WITHRayna Sexton AT 0277 ON 12/12/19 SNG    Culture (A)  Final    PROTEUS MIRABILIS CITROBACTER YOUNGAE SUSCEPTIBILITIES PERFORMED ON PREVIOUS CULTURE WITHIN THE LAST 5 DAYS. Performed at [facility], [place] [address], [place], [place] [address]    Report Status 12/16/2019 FINAL  Final  Blood Culture (routine x 2)     Status: Abnormal   Collection Time: 12/11/19  7:45 PM   Specimen: BLOOD  Result Value Ref Range Status   Specimen Description   Final    BLOOD RIGHT ARM Performed at [facility], [address] [place], [place] [address]    Special Requests   Final    BOTTLES DRAWN AEROBIC AND ANAEROBIC Blood Culture adequate volume Performed at [facility], [address], [place], [place] [address]    Culture  Setup Time   Final    GRAM NEGATIVE RODS IN BOTH AEROBIC AND ANAEROBIC BOTTLES CRITICAL VALUE NOTED.  VALUE IS CONSISTENT WITH PREVIOUSLY REPORTED AND CALLED VALUE. Performed at [facility], [address] [place], [place] [address]    Culture PROTEUS MIRABILIS Su Hoff  (A)  Final   Report Status 12/16/2019 FINAL  Final   Organism ID, Bacteria PROTEUS MIRABILIS  Final   Organism ID, Bacteria CITROBACTER YOUNGAE  Final      Susceptibility   Citrobacter youngae - MIC*    CEFAZOLIN >=64 RESISTANT Resistant     CEFEPIME <=1 SENSITIVE Sensitive     CEFTAZIDIME >=64 RESISTANT Resistant     CEFTRIAXONE >=64 RESISTANT Resistant      CIPROFLOXACIN <=0.25 SENSITIVE Sensitive     GENTAMICIN <=1 SENSITIVE Sensitive     IMIPENEM 1 SENSITIVE Sensitive     TRIMETH/SULFA <=20 SENSITIVE Sensitive     PIP/TAZO 64 INTERMEDIATE Intermediate     * CITROBACTER YOUNGAE   Proteus mirabilis - MIC*    AMPICILLIN <=2 SENSITIVE Sensitive     CEFAZOLIN <=4 SENSITIVE Sensitive     CEFEPIME <=1 SENSITIVE Sensitive     CEFTAZIDIME <=1 SENSITIVE Sensitive     CEFTRIAXONE <=1 SENSITIVE Sensitive     CIPROFLOXACIN <=0.25 SENSITIVE Sensitive     GENTAMICIN <=1 SENSITIVE Sensitive     IMIPENEM 4 SENSITIVE Sensitive     TRIMETH/SULFA <=20 SENSITIVE Sensitive     AMPICILLIN/SULBACTAM <=2 SENSITIVE Sensitive     PIP/TAZO <=4 SENSITIVE Sensitive     * PROTEUS MIRABILIS  Blood Culture ID Panel (Reflexed)     Status: Abnormal   Collection Time: 12/11/19  7:45 PM  Result Value Ref Range Status   Enterococcus species  NOT DETECTED NOT DETECTED Final   Listeria monocytogenes NOT DETECTED NOT DETECTED Final   Staphylococcus species NOT DETECTED NOT DETECTED Final   Staphylococcus aureus (BCID) NOT DETECTED NOT DETECTED Final   Streptococcus species NOT DETECTED NOT DETECTED Final   Streptococcus agalactiae NOT DETECTED NOT DETECTED Final   Streptococcus pneumoniae NOT DETECTED NOT DETECTED Final   Streptococcus pyogenes NOT DETECTED NOT DETECTED Final   Acinetobacter baumannii NOT DETECTED NOT DETECTED Final   Enterobacteriaceae species DETECTED (A) NOT DETECTED Final    Comment: Enterobacteriaceae represent a large family of gram-negative bacteria, not a single organism. CRITICAL RESULT CALLED TO, READ BACK BY AND VERIFIED WITH: Rayna Sexton AT 5176 ON 12/12/19 SNG    Enterobacter cloacae complex NOT DETECTED NOT DETECTED Final   Escherichia coli NOT DETECTED NOT DETECTED Final   Klebsiella oxytoca NOT DETECTED NOT DETECTED Final   Klebsiella pneumoniae NOT DETECTED NOT DETECTED Final   Proteus species DETECTED (A) NOT DETECTED Final     Comment: CRITICAL RESULT CALLED TO, READ BACK BY AND VERIFIED WITH: Rayna Sexton AT 1607 ON 12/12/19 SNG    Serratia marcescens NOT DETECTED NOT DETECTED Final   Carbapenem resistance NOT DETECTED NOT DETECTED Final   Haemophilus influenzae NOT DETECTED NOT DETECTED Final   Neisseria meningitidis NOT DETECTED NOT DETECTED Final   Pseudomonas aeruginosa NOT DETECTED NOT DETECTED Final   Candida albicans NOT DETECTED NOT DETECTED Final   Candida glabrata NOT DETECTED NOT DETECTED Final   Candida krusei NOT DETECTED NOT DETECTED Final   Candida parapsilosis NOT DETECTED NOT DETECTED Final   Candida tropicalis NOT DETECTED NOT DETECTED Final    Comment: Performed at [facility], [address], [place], [place] [address]  Urine culture     Status: Abnormal   Collection Time: 12/11/19  8:09 PM   Specimen: In/Out Cath Urine  Result Value Ref Range Status   Specimen Description   Final    IN/OUT CATH URINE Performed at [facility], [address], [place], [place] [address]    Special Requests   Final    NONE Performed at [facility], [address] [place], [place] [address]    Culture MULTIPLE SPECIES PRESENT, SUGGEST RECOLLECTION (A)  Final   Report Status 12/12/2019 FINAL  Final  Respiratory Panel by RT PCR (Flu A&B, Covid) - Nasopharyngeal Swab     Status: None   Collection Time: 12/11/19  9:17 PM   Specimen: Nasopharyngeal Swab  Result Value Ref Range Status   SARS Coronavirus 2 by RT PCR NEGATIVE NEGATIVE Final    Comment: (NOTE) SARS-CoV-2 target nucleic acids are NOT DETECTED. The SARS-CoV-2 RNA is generally detectable in upper respiratoy specimens during the acute phase of infection. The lowest concentration of SARS-CoV-2 viral copies this assay can detect is 131 copies/mL. A negative result does not preclude SARS-Cov-2 infection and should not be used as the sole basis for treatment or other patient management decisions. A negative result  may occur with  improper specimen collection/handling, submission of specimen other than nasopharyngeal swab, presence of viral mutation(s) within the areas targeted by this assay, and inadequate number of viral copies (<131 copies/mL). A negative result must be combined with clinical observations, patient history, and epidemiological information. The expected result is Negative. Fact Sheet for Patients:  PinkCheek.be Fact Sheet for Healthcare Providers:  GravelBags.it This test is not yet ap proved or cleared by the [place] FDA and  has been authorized for detection and/or diagnosis  of SARS-CoV-2 by FDA under an Emergency Use Authorization (EUA). This EUA will remain  in effect (meaning this test can be used) for the duration of the COVID-19 declaration under Section 564(b)(1) of the Act, 21 U.S.C. section 360bbb-3(b)(1), unless the authorization is terminated or revoked sooner.    Influenza A by PCR NEGATIVE NEGATIVE Final   Influenza B by PCR NEGATIVE NEGATIVE Final    Comment: (NOTE) The Xpert Xpress SARS-CoV-2/FLU/RSV assay is intended as an aid in  the diagnosis of influenza from Nasopharyngeal swab specimens and  should not be used as a sole basis for treatment. Nasal washings and  aspirates are unacceptable for Xpert Xpress SARS-CoV-2/FLU/RSV  testing. Fact Sheet for Patients: PinkCheek.be Fact Sheet for Healthcare Providers: GravelBags.it This test is not yet approved or cleared by the [place] FDA and  has been authorized for detection and/or diagnosis of SARS-CoV-2 by  FDA under an Emergency Use Authorization (EUA). This EUA will remain  in effect (meaning this test can be used) for the duration of the  Covid-19 declaration under Section 564(b)(1) of the Act, 21  U.S.C. section 360bbb-3(b)(1), unless the authorization is  terminated or  revoked. Performed at [facility], [address] [place], [place] [address]   MRSA PCR Screening     Status: None   Collection Time: 12/12/19 12:29 AM   Specimen: Nasopharyngeal  Result Value Ref Range Status   MRSA by PCR NEGATIVE NEGATIVE Final    Comment:        The GeneXpert MRSA Assay (FDA approved for NASAL specimens only), is one component of a comprehensive MRSA colonization surveillance program. It is not intended to diagnose MRSA infection nor to guide or monitor treatment for MRSA infections. Performed at [facility], [address] [place], [place] [address]   Aerobic/Anaerobic Culture (surgical/deep wound)     Status: None   Collection Time: 12/12/19  2:06 AM   Specimen: Sacral; Wound  Result Value Ref Range Status   Specimen Description   Final    SACRAL Performed at [facility], [address] [place], [place] [address]    Special Requests   Final    NONE Performed at [facility] [place], [place] [address]    Gram Stain NO WBC SEEN NO ORGANISMS SEEN   Final   Culture   Final    FEW ESCHERICHIA COLI FEW CITROBACTER SPECIES FEW PROTEUS MIRABILIS FEW BACTEROIDES OVATUS BETA LACTAMASE POSITIVE Performed at [facility], [place] [address], [place], [place] [address]    Report Status 12/15/2019 FINAL  Final   Organism ID, Bacteria ESCHERICHIA COLI  Final   Organism ID, Bacteria CITROBACTER SPECIES  Final   Organism ID, Bacteria PROTEUS MIRABILIS  Final      Susceptibility   Citrobacter species - MIC*    CEFAZOLIN >=64 RESISTANT Resistant     CEFEPIME <=1 SENSITIVE Sensitive     CEFTAZIDIME >=64 RESISTANT Resistant     CEFTRIAXONE >=64 RESISTANT Resistant     CIPROFLOXACIN <=0.25 SENSITIVE Sensitive     GENTAMICIN <=1 SENSITIVE Sensitive     IMIPENEM <=0.25 SENSITIVE Sensitive     TRIMETH/SULFA <=20 SENSITIVE Sensitive     PIP/TAZO 64 INTERMEDIATE Intermediate     * FEW CITROBACTER  SPECIES   Escherichia coli - MIC*    AMPICILLIN 16 INTERMEDIATE Intermediate     CEFAZOLIN >=64 RESISTANT Resistant     CEFEPIME <=1 SENSITIVE Sensitive  CEFTAZIDIME <=1 SENSITIVE Sensitive     CEFTRIAXONE <=1 SENSITIVE Sensitive     CIPROFLOXACIN <=0.25 SENSITIVE Sensitive     GENTAMICIN <=1 SENSITIVE Sensitive     IMIPENEM <=0.25 SENSITIVE Sensitive     TRIMETH/SULFA <=20 SENSITIVE Sensitive     AMPICILLIN/SULBACTAM 4 SENSITIVE Sensitive     PIP/TAZO <=4 SENSITIVE Sensitive     * FEW ESCHERICHIA COLI   Proteus mirabilis - MIC*    AMPICILLIN <=2 SENSITIVE Sensitive     CEFAZOLIN <=4 SENSITIVE Sensitive     CEFEPIME <=1 SENSITIVE Sensitive     CEFTAZIDIME <=1 SENSITIVE Sensitive     CEFTRIAXONE <=1 SENSITIVE Sensitive     CIPROFLOXACIN <=0.25 SENSITIVE Sensitive     GENTAMICIN 2 SENSITIVE Sensitive     IMIPENEM 4 SENSITIVE Sensitive     TRIMETH/SULFA <=20 SENSITIVE Sensitive     AMPICILLIN/SULBACTAM <=2 SENSITIVE Sensitive     PIP/TAZO <=4 SENSITIVE Sensitive     * FEW PROTEUS MIRABILIS  Aerobic/Anaerobic Culture (surgical/deep wound)     Status: None (Preliminary result)   Collection Time: 12/12/19  2:06 AM   Specimen: Back; Wound  Result Value Ref Range Status   Specimen Description   Final    BACK LUMBAR SPINE Performed at [facility], [address] [place], [place] [address]    Special Requests   Final    NONE Performed at [facility], [address] [place], [place] [address]    Gram Stain   Final    NO WBC SEEN NO ORGANISMS SEEN Performed at [facility], [place] [address] [place], [place] [address]    Culture   Final    FEW PROTEUS MIRABILIS NO ANAEROBES ISOLATED; CULTURE IN PROGRESS FOR 5 DAYS    Report Status PENDING  Incomplete   Organism ID, Bacteria PROTEUS MIRABILIS  Final      Susceptibility   Proteus mirabilis - MIC*    AMPICILLIN <=2 SENSITIVE Sensitive     CEFAZOLIN <=4 SENSITIVE Sensitive     CEFEPIME <=1  SENSITIVE Sensitive     CEFTAZIDIME <=1 SENSITIVE Sensitive     CEFTRIAXONE <=1 SENSITIVE Sensitive     CIPROFLOXACIN <=0.25 SENSITIVE Sensitive     GENTAMICIN 2 SENSITIVE Sensitive     IMIPENEM 4 SENSITIVE Sensitive     TRIMETH/SULFA <=20 SENSITIVE Sensitive     AMPICILLIN/SULBACTAM <=2 SENSITIVE Sensitive     PIP/TAZO <=4 SENSITIVE Sensitive     * FEW PROTEUS MIRABILIS      ASSESSMENT AND PLAN SYNOPSIS  Proteus bacteremia and citrobacter bacteremia  Source from back wounds  SEPSIS RESOLVED  INFECTIOUS DISEASE -continue antibiotics as prescribed -follow up cultures -follow up ID consultation  END STAGE KIDNEY INJURY/Renal Failure -Avoid nephrotoxic agents -Ensure adequate renal perfusion, optimize oxygenation -Renal dose medications HD as needed    NEUROLOGY Acute toxic metabolic encephalopathy protection airway Advanced dementai  ELECTROLYTES -follow labs as needed -replace as needed -pharmacy consultation and following   DVT/GI PRX ordered and assessed TRANSFUSIONS AS NEEDED MONITOR FSBS I Assessed the need for Labs I Assessed the need for Central Venous Line I Assessed the need for Mobilization I made an Assessment of medications to be adjusted accordingly Safety Risk assessment completed  CASE DISCUSSED IN MULTIDISCIPLINARY ROUNDS WITH ICU TEAM    VERY POOR PROGNOSIS, VERY POOR CHANCE OF MEANINGFUL RECOVERY, I RECOMMEND HOSPICE AND COMFORT CARE    Daniella Dewberry Patricia Pesa, M.D.  [facility]  Medical Director  [facility]

## 2019-12-17 NOTE — Progress Notes (Signed)
Vital signs reviewed, ICU needs resolved  Will sign off at this time. No further recommendations at this time.  Please call [phone] for further questions. Thank you.    Nery Frappier David Rino Hosea, M.D.  [facility]  Medical Director ICU-[facility] Medical Director [facility]   

## 2019-12-17 NOTE — Plan of Care (Signed)
  Problem: Education: Goal: Knowledge of General Education information will improve Description: Including pain rating scale, medication(s)/side effects and non-pharmacologic comfort measures Outcome: Not Progressing   Problem: Health Behavior/Discharge Planning: Goal: Ability to manage health-related needs will improve Outcome: Not Progressing   Problem: Clinical Measurements: Goal: Ability to maintain clinical measurements within normal limits will improve Outcome: Not Progressing Goal: Will remain free from infection Outcome: Not Progressing Goal: Diagnostic test results will improve Outcome: Not Progressing   Problem: Activity: Goal: Risk for activity intolerance will decrease Outcome: Not Progressing   Problem: Nutrition: Goal: Adequate nutrition will be maintained Outcome: Not Progressing   Problem: Coping: Goal: Level of anxiety will decrease Outcome: Not Progressing   Problem: Elimination: Goal: Will not experience complications related to bowel motility Outcome: Not Progressing Goal: Will not experience complications related to urinary retention Outcome: Not Progressing   Problem: Pain Managment: Goal: General experience of comfort will improve Outcome: Not Progressing   Problem: Safety: Goal: Ability to remain free from injury will improve Outcome: Not Progressing   Problem: Skin Integrity: Goal: Risk for impaired skin integrity will decrease Outcome: Not Progressing

## 2019-12-18 DIAGNOSIS — R9431 Abnormal electrocardiogram [ECG] [EKG]: Secondary | ICD-10-CM

## 2019-12-18 DIAGNOSIS — E039 Hypothyroidism, unspecified: Secondary | ICD-10-CM

## 2019-12-18 LAB — BASIC METABOLIC PANEL
Anion gap: 7 (ref 5–15)
BUN: 6 mg/dL — ABNORMAL LOW (ref 8–23)
CO2: 27 mmol/L (ref 22–32)
Calcium: 7.5 mg/dL — ABNORMAL LOW (ref 8.9–10.3)
Chloride: 105 mmol/L (ref 98–111)
Creatinine, Ser: 1.38 mg/dL — ABNORMAL HIGH (ref 0.44–1.00)
GFR calc Af Amer: 43 mL/min — ABNORMAL LOW (ref >60)
GFR calc non Af Amer: 37 mL/min — ABNORMAL LOW (ref >60)
Glucose, Bld: 177 mg/dL — ABNORMAL HIGH (ref 70–99)
Potassium: 3 mmol/L — ABNORMAL LOW (ref 3.5–5.1)
Sodium: 139 mmol/L (ref 135–145)

## 2019-12-18 LAB — GLUCOSE, CAPILLARY
Glucose-Capillary: 129 mg/dL — ABNORMAL HIGH (ref 70–99)
Glucose-Capillary: 144 mg/dL — ABNORMAL HIGH (ref 70–99)
Glucose-Capillary: 155 mg/dL — ABNORMAL HIGH (ref 70–99)
Glucose-Capillary: 156 mg/dL — ABNORMAL HIGH (ref 70–99)
Glucose-Capillary: 161 mg/dL — ABNORMAL HIGH (ref 70–99)
Glucose-Capillary: 165 mg/dL — ABNORMAL HIGH (ref 70–99)
Glucose-Capillary: 169 mg/dL — ABNORMAL HIGH (ref 70–99)
Glucose-Capillary: 176 mg/dL — ABNORMAL HIGH (ref 70–99)
Glucose-Capillary: 179 mg/dL — ABNORMAL HIGH (ref 70–99)

## 2019-12-18 LAB — CBC WITH DIFFERENTIAL/PLATELET
Abs Immature Granulocytes: 0.24 10*3/uL — ABNORMAL HIGH (ref 0.00–0.07)
Basophils Absolute: 0.1 10*3/uL (ref 0.0–0.1)
Basophils Relative: 0 %
Eosinophils Absolute: 0.1 10*3/uL (ref 0.0–0.5)
Eosinophils Relative: 1 %
HCT: 24.8 % — ABNORMAL LOW (ref 36.0–46.0)
Hemoglobin: 7.3 g/dL — ABNORMAL LOW (ref 12.0–15.0)
Immature Granulocytes: 2 %
Lymphocytes Relative: 21 %
Lymphs Abs: 2.4 10*3/uL (ref 0.7–4.0)
MCH: 26.8 pg (ref 26.0–34.0)
MCHC: 29.4 g/dL — ABNORMAL LOW (ref 30.0–36.0)
MCV: 91.2 fL (ref 80.0–100.0)
Monocytes Absolute: 0.9 10*3/uL (ref 0.1–1.0)
Monocytes Relative: 8 %
Neutro Abs: 8 10*3/uL — ABNORMAL HIGH (ref 1.7–7.7)
Neutrophils Relative %: 68 %
Platelets: 162 10*3/uL (ref 150–400)
RBC: 2.72 MIL/uL — ABNORMAL LOW (ref 3.87–5.11)
RDW: 22.5 % — ABNORMAL HIGH (ref 11.5–15.5)
WBC: 11.7 10*3/uL — ABNORMAL HIGH (ref 4.0–10.5)
nRBC: 0 % (ref 0.0–0.2)

## 2019-12-18 LAB — MAGNESIUM: Magnesium: 1.9 mg/dL (ref 1.7–2.4)

## 2019-12-18 LAB — AMMONIA: Ammonia: 61 umol/L — ABNORMAL HIGH (ref 9–35)

## 2019-12-18 LAB — PHOSPHORUS: Phosphorus: 1.9 mg/dL — ABNORMAL LOW (ref 2.5–4.6)

## 2019-12-18 MED ORDER — LEVOTHYROXINE SODIUM 100 MCG/5ML IV SOLN
50.0000 ug | Freq: Every day | INTRAVENOUS | Status: DC
  Administered 2019-12-19 – 2019-12-23 (×4): 50 ug via INTRAVENOUS
  Filled 2019-12-18 (×7): qty 5

## 2019-12-18 NOTE — Progress Notes (Signed)
[name], female   DOB: 1940-08-29, 79 y.o.   MRN: [id number] [facility] PROGRESS NOTE  TYNIESHA HOWALD YIR:[id number] DOB: Sep 26, 1940 DOA: 12/11/2019 PCP: McLean-Scocuzza, Nino Glow, MD  HPI/Subjective: Patient opened her eyes today.  When I lifted up either arm and leg go she was able to slowly have her arm go down on the bed.  Objective: Vitals:   12/18/19 1300 12/18/19 1400  BP: (!) 146/59 (!) 143/55  Pulse: 90 90  Resp: 11 14  Temp:    SpO2: 98% 99%    Intake/Output Summary (Last 24 hours) at 12/18/2019 1437 Last data filed at 12/18/2019 1200 Gross per 24 hour  Intake 1021 ml  Output 212 ml  Net 809 ml   Filed Weights   12/15/19 0119 12/15/19 1700 12/16/19 0152  Weight: 79 kg 79 kg 79.9 kg    ROS: Review of Systems  Unable to perform ROS: Acuity of condition   Exam: Physical Exam  Constitutional: She appears lethargic.  HENT:  Nose: No mucosal edema.  Eyes:  Patient closed her eyes when she was trying to look in them.  Again resisted me opening them  Neck: Carotid bruit is not present.  Cardiovascular: Regular rhythm, S1 normal, S2 normal and normal heart sounds.  Respiratory: No respiratory distress. She has decreased breath sounds in the right lower field and the left lower field. She has no wheezes. She has no rhonchi. She has no rales.  GI: Soft. Bowel sounds are normal. There is no abdominal tenderness.  Musculoskeletal:     Right ankle: Swelling present.     Left ankle: Swelling present.  Lymphadenopathy:    She has no cervical adenopathy.  Neurological: She appears lethargic.  Opened eyes to voice.  When I lifted her arm she slowly allowed him to fall back on the bed.  Moved her foot a little bit with Babinski  Skin: Skin is warm.  As per pictures large stage IV decubitus on buttocks  Psychiatric:  Opened eyes today      Data Reviewed: Basic Metabolic Panel: Recent Labs  Lab 12/14/19 0600 12/14/19 0600 12/15/19 0414  12/16/19 0343 12/16/19 0900 12/17/19 0447 12/17/19 1131 12/18/19 0709  NA 134*  --  131* 136  --   --  134* 139  K 2.9*   < > 3.4* 2.7* 3.0*  --  3.6 3.0*  CL 100  --  99 101  --   --  103 105  CO2 26  --  24 28  --   --  24 27  GLUCOSE 77  --  106* 105*  --   --  117* 177*  BUN 14  --  16 8  --   --  10 6*  CREATININE 1.80*  --  2.17* 1.44*  --   --  2.07* 1.38*  CALCIUM 7.2*  --  7.2* 7.3*  --   --  7.6* 7.5*  MG 2.1  --  1.9 1.6*  --  2.0  --  1.9  PHOS 1.6*   < > 3.5 2.1*  --  3.4 3.7 1.9*   < > = values in this interval not displayed.   Liver Function Tests: Recent Labs  Lab 12/11/19 1945 12/17/19 1131  AST 30  --   ALT 23  --   ALKPHOS 90  --   BILITOT 0.4  --   PROT 5.1*  --   ALBUMIN 1.5* 1.7*    Recent Labs  Lab 12/11/19 1945 12/18/19 0709  AMMONIA 44* 61*   CBC: Recent Labs  Lab 12/14/19 0600 12/14/19 0600 12/15/19 0414 12/16/19 0343 12/17/19 0447 12/17/19 1131 12/18/19 0709  WBC 8.8   < > 10.1 10.5 9.7 13.2* 11.7*  NEUTROABS 6.7  --  8.6* 7.9* 6.6  --  8.0*  HGB 6.7*   < > 7.3* 7.1* 7.2* 7.4* 7.3*  HCT 22.6*   < > 24.4* 22.5* 23.9* 24.7* 24.8*  MCV 89.7   < > 89.1 84.6 88.5 89.2 91.2  PLT 82*   < > 73* 77* 112* 133* 162   < > = values in this interval not displayed.   BNP (last 3 results) Recent Labs    05/30/19 2056  BNP 851.4*    CBG: Recent Labs  Lab 12/18/19 0004 12/18/19 0348 12/18/19 0740 12/18/19 1127 12/18/19 1135  GLUCAP 129* 156* 165* 161* 155*    Recent Results (from the past 240 hour(s))  Blood Culture (routine x 2)     Status: Abnormal   Collection Time: 12/11/19  7:45 PM   Specimen: BLOOD  Result Value Ref Range Status   Specimen Description   Final    BLOOD RIGHT FA Performed at [facility], [address] [place], [place] [address]    Special Requests   Final    BOTTLES DRAWN AEROBIC AND ANAEROBIC Blood Culture adequate volume Performed at [facility], [address]  [place], [place] [address]    Culture  Setup Time   Final    GRAM NEGATIVE RODS IN BOTH AEROBIC AND ANAEROBIC BOTTLES CRITICAL RESULT CALLED TO, READ BACK BY AND VERIFIED WITH: Rayna Sexton AT 9735 ON 12/12/19 SNG    Culture (A)  Final    PROTEUS MIRABILIS CITROBACTER YOUNGAE SUSCEPTIBILITIES PERFORMED ON PREVIOUS CULTURE WITHIN THE LAST 5 DAYS. Performed at [facility], [place] [address], [place], [place] [address]    Report Status 12/16/2019 FINAL  Final  Blood Culture (routine x 2)     Status: Abnormal   Collection Time: 12/11/19  7:45 PM   Specimen: BLOOD  Result Value Ref Range Status   Specimen Description   Final    BLOOD RIGHT ARM Performed at [facility], [address], [place], [place] [address]    Special Requests   Final    BOTTLES DRAWN AEROBIC AND ANAEROBIC Blood Culture adequate volume Performed at [facility], [address], [place], [place] [address]    Culture  Setup Time   Final    GRAM NEGATIVE RODS IN BOTH AEROBIC AND ANAEROBIC BOTTLES CRITICAL VALUE NOTED.  VALUE IS CONSISTENT WITH PREVIOUSLY REPORTED AND CALLED VALUE. Performed at [facility], [address] [place], [place] [address]    Culture PROTEUS MIRABILIS Su [place]  (A)  Final   Report Status 12/16/2019 FINAL  Final   Organism ID, Bacteria PROTEUS MIRABILIS  Final   Organism ID, Bacteria CITROBACTER YOUNGAE  Final      Susceptibility   Citrobacter youngae - MIC*    CEFAZOLIN >=64 RESISTANT Resistant     CEFEPIME <=1 SENSITIVE Sensitive     CEFTAZIDIME >=64 RESISTANT Resistant     CEFTRIAXONE >=64 RESISTANT Resistant     CIPROFLOXACIN <=0.25 SENSITIVE Sensitive     GENTAMICIN <=1 SENSITIVE Sensitive     IMIPENEM 1 SENSITIVE Sensitive     TRIMETH/SULFA <=20 SENSITIVE Sensitive     PIP/TAZO 64 INTERMEDIATE Intermediate     * CITROBACTER YOUNGAE  Proteus mirabilis - MIC*    AMPICILLIN <=2 SENSITIVE Sensitive     CEFAZOLIN <=4 SENSITIVE Sensitive      CEFEPIME <=1 SENSITIVE Sensitive     CEFTAZIDIME <=1 SENSITIVE Sensitive     CEFTRIAXONE <=1 SENSITIVE Sensitive     CIPROFLOXACIN <=0.25 SENSITIVE Sensitive     GENTAMICIN <=1 SENSITIVE Sensitive     IMIPENEM 4 SENSITIVE Sensitive     TRIMETH/SULFA <=20 SENSITIVE Sensitive     AMPICILLIN/SULBACTAM <=2 SENSITIVE Sensitive     PIP/TAZO <=4 SENSITIVE Sensitive     * PROTEUS MIRABILIS  Blood Culture ID Panel (Reflexed)     Status: Abnormal   Collection Time: 12/11/19  7:45 PM  Result Value Ref Range Status   Enterococcus species NOT DETECTED NOT DETECTED Final   Listeria monocytogenes NOT DETECTED NOT DETECTED Final   Staphylococcus species NOT DETECTED NOT DETECTED Final   Staphylococcus aureus (BCID) NOT DETECTED NOT DETECTED Final   Streptococcus species NOT DETECTED NOT DETECTED Final   Streptococcus agalactiae NOT DETECTED NOT DETECTED Final   Streptococcus pneumoniae NOT DETECTED NOT DETECTED Final   Streptococcus pyogenes NOT DETECTED NOT DETECTED Final   Acinetobacter baumannii NOT DETECTED NOT DETECTED Final   Enterobacteriaceae species DETECTED (A) NOT DETECTED Final    Comment: Enterobacteriaceae represent a large family of gram-negative bacteria, not a single organism. CRITICAL RESULT CALLED TO, READ BACK BY AND VERIFIED WITH: Rayna Sexton AT 9562 ON 12/12/19 SNG    Enterobacter cloacae complex NOT DETECTED NOT DETECTED Final   Escherichia coli NOT DETECTED NOT DETECTED Final   Klebsiella oxytoca NOT DETECTED NOT DETECTED Final   Klebsiella pneumoniae NOT DETECTED NOT DETECTED Final   Proteus species DETECTED (A) NOT DETECTED Final    Comment: CRITICAL RESULT CALLED TO, READ BACK BY AND VERIFIED WITH: Rayna Sexton AT 1308 ON 12/12/19 SNG    Serratia marcescens NOT DETECTED NOT DETECTED Final   Carbapenem resistance NOT DETECTED NOT DETECTED Final   Haemophilus influenzae NOT DETECTED NOT DETECTED Final   Neisseria meningitidis NOT DETECTED NOT DETECTED Final    Pseudomonas aeruginosa NOT DETECTED NOT DETECTED Final   Candida albicans NOT DETECTED NOT DETECTED Final   Candida glabrata NOT DETECTED NOT DETECTED Final   Candida krusei NOT DETECTED NOT DETECTED Final   Candida parapsilosis NOT DETECTED NOT DETECTED Final   Candida tropicalis NOT DETECTED NOT DETECTED Final    Comment: Performed at [facility], [address] [place], [place] [address]  Urine culture     Status: Abnormal   Collection Time: 12/11/19  8:09 PM   Specimen: In/Out Cath Urine  Result Value Ref Range Status   Specimen Description   Final    IN/OUT CATH URINE Performed at [facility], [address] [place], [place] [address]    Special Requests   Final    NONE Performed at [facility], [address] [place], [place] [address]    Culture MULTIPLE SPECIES PRESENT, SUGGEST RECOLLECTION (A)  Final   Report Status 12/12/2019 FINAL  Final  Respiratory Panel by RT PCR (Flu A&B, Covid) - Nasopharyngeal Swab     Status: None   Collection Time: 12/11/19  9:17 PM   Specimen: Nasopharyngeal Swab  Result Value Ref Range Status   SARS Coronavirus 2 by RT PCR NEGATIVE NEGATIVE Final    Comment: (NOTE) SARS-CoV-2 target nucleic acids are NOT DETECTED. The SARS-CoV-2 RNA is generally detectable in upper respiratoy specimens during the acute phase of infection. The  lowest concentration of SARS-CoV-2 viral copies this assay can detect is 131 copies/mL. A negative result does not preclude SARS-Cov-2 infection and should not be used as the sole basis for treatment or other patient management decisions. A negative result may occur with  improper specimen collection/handling, submission of specimen other than nasopharyngeal swab, presence of viral mutation(s) within the areas targeted by this assay, and inadequate number of viral copies (<131 copies/mL). A negative result must be combined with clinical observations, patient history, and  epidemiological information. The expected result is Negative. Fact Sheet for Patients:  PinkCheek.be Fact Sheet for Healthcare Providers:  GravelBags.it This test is not yet ap proved or cleared by the [place] FDA and  has been authorized for detection and/or diagnosis of SARS-CoV-2 by FDA under an Emergency Use Authorization (EUA). This EUA will remain  in effect (meaning this test can be used) for the duration of the COVID-19 declaration under Section 564(b)(1) of the Act, 21 U.S.C. section 360bbb-3(b)(1), unless the authorization is terminated or revoked sooner.    Influenza A by PCR NEGATIVE NEGATIVE Final   Influenza B by PCR NEGATIVE NEGATIVE Final    Comment: (NOTE) The Xpert Xpress SARS-CoV-2/FLU/RSV assay is intended as an aid in  the diagnosis of influenza from Nasopharyngeal swab specimens and  should not be used as a sole basis for treatment. Nasal washings and  aspirates are unacceptable for Xpert Xpress SARS-CoV-2/FLU/RSV  testing. Fact Sheet for Patients: PinkCheek.be Fact Sheet for Healthcare Providers: GravelBags.it This test is not yet approved or cleared by the [place] FDA and  has been authorized for detection and/or diagnosis of SARS-CoV-2 by  FDA under an Emergency Use Authorization (EUA). This EUA will remain  in effect (meaning this test can be used) for the duration of the  Covid-19 declaration under Section 564(b)(1) of the Act, 21  U.S.C. section 360bbb-3(b)(1), unless the authorization is  terminated or revoked. Performed at [facility], [address] [place], [place] [address]   MRSA PCR Screening     Status: None   Collection Time: 12/12/19 12:29 AM   Specimen: Nasopharyngeal  Result Value Ref Range Status   MRSA by PCR NEGATIVE NEGATIVE Final    Comment:        The GeneXpert MRSA Assay (FDA approved for  NASAL specimens only), is one component of a comprehensive MRSA colonization surveillance program. It is not intended to diagnose MRSA infection nor to guide or monitor treatment for MRSA infections. Performed at [facility], [address] [place], [place] [address]   Aerobic/Anaerobic Culture (surgical/deep wound)     Status: None   Collection Time: 12/12/19  2:06 AM   Specimen: Sacral; Wound  Result Value Ref Range Status   Specimen Description   Final    SACRAL Performed at [facility], [address] [place], [place] [address]    Special Requests   Final    NONE Performed at [facility], [place], [place] [address]    Gram Stain NO WBC SEEN NO ORGANISMS SEEN   Final   Culture   Final    FEW ESCHERICHIA COLI FEW CITROBACTER SPECIES FEW PROTEUS MIRABILIS FEW BACTEROIDES OVATUS BETA LACTAMASE POSITIVE Performed at [facility], [place] [address] [place], [place] [address]    Report Status 12/15/2019 FINAL  Final   Organism ID, Bacteria ESCHERICHIA COLI  Final   Organism ID, Bacteria CITROBACTER SPECIES  Final   Organism ID, Bacteria  PROTEUS MIRABILIS  Final      Susceptibility   Citrobacter species - MIC*    CEFAZOLIN >=64 RESISTANT Resistant     CEFEPIME <=1 SENSITIVE Sensitive     CEFTAZIDIME >=64 RESISTANT Resistant     CEFTRIAXONE >=64 RESISTANT Resistant     CIPROFLOXACIN <=0.25 SENSITIVE Sensitive     GENTAMICIN <=1 SENSITIVE Sensitive     IMIPENEM <=0.25 SENSITIVE Sensitive     TRIMETH/SULFA <=20 SENSITIVE Sensitive     PIP/TAZO 64 INTERMEDIATE Intermediate     * FEW CITROBACTER SPECIES   Escherichia coli - MIC*    AMPICILLIN 16 INTERMEDIATE Intermediate     CEFAZOLIN >=64 RESISTANT Resistant     CEFEPIME <=1 SENSITIVE Sensitive     CEFTAZIDIME <=1 SENSITIVE Sensitive     CEFTRIAXONE <=1 SENSITIVE Sensitive     CIPROFLOXACIN <=0.25 SENSITIVE Sensitive     GENTAMICIN <=1 SENSITIVE Sensitive      IMIPENEM <=0.25 SENSITIVE Sensitive     TRIMETH/SULFA <=20 SENSITIVE Sensitive     AMPICILLIN/SULBACTAM 4 SENSITIVE Sensitive     PIP/TAZO <=4 SENSITIVE Sensitive     * FEW ESCHERICHIA COLI   Proteus mirabilis - MIC*    AMPICILLIN <=2 SENSITIVE Sensitive     CEFAZOLIN <=4 SENSITIVE Sensitive     CEFEPIME <=1 SENSITIVE Sensitive     CEFTAZIDIME <=1 SENSITIVE Sensitive     CEFTRIAXONE <=1 SENSITIVE Sensitive     CIPROFLOXACIN <=0.25 SENSITIVE Sensitive     GENTAMICIN 2 SENSITIVE Sensitive     IMIPENEM 4 SENSITIVE Sensitive     TRIMETH/SULFA <=20 SENSITIVE Sensitive     AMPICILLIN/SULBACTAM <=2 SENSITIVE Sensitive     PIP/TAZO <=4 SENSITIVE Sensitive     * FEW PROTEUS MIRABILIS  Aerobic/Anaerobic Culture (surgical/deep wound)     Status: None   Collection Time: 12/12/19  2:06 AM   Specimen: Back; Wound  Result Value Ref Range Status   Specimen Description   Final    BACK LUMBAR SPINE Performed at [facility], [address] [place], [place] [address]    Special Requests   Final    NONE Performed at [facility], [place], [place] [address]    Gram Stain NO WBC SEEN NO ORGANISMS SEEN   Final   Culture   Final    FEW PROTEUS MIRABILIS NO ANAEROBES ISOLATED Performed at [facility], [place] [address] [place],  [address]    Report Status 12/17/2019 FINAL  Final   Organism ID, Bacteria PROTEUS MIRABILIS  Final      Susceptibility   Proteus mirabilis - MIC*    AMPICILLIN <=2 SENSITIVE Sensitive     CEFAZOLIN <=4 SENSITIVE Sensitive     CEFEPIME <=1 SENSITIVE Sensitive     CEFTAZIDIME <=1 SENSITIVE Sensitive     CEFTRIAXONE <=1 SENSITIVE Sensitive     CIPROFLOXACIN <=0.25 SENSITIVE Sensitive     GENTAMICIN 2 SENSITIVE Sensitive     IMIPENEM 4 SENSITIVE Sensitive     TRIMETH/SULFA <=20 SENSITIVE Sensitive     AMPICILLIN/SULBACTAM <=2 SENSITIVE Sensitive     PIP/TAZO <=4 SENSITIVE Sensitive     * FEW PROTEUS MIRABILIS      Studies: DG Abd Portable 1V  Result Date: 12/17/2019 CLINICAL DATA:  NG tube placement. EXAM: PORTABLE ABDOMEN - 1 VIEW COMPARISON:  CT, 12/11/2019. FINDINGS: Nasogastric tube passes below the diaphragm, tip projects in the mid stomach. No bowel dilation. IMPRESSION: Nasal/orogastric tube tip projects in the mid stomach. Electronically  Signed   By: Lajean Manes M.D.   On: 12/17/2019 10:37    Scheduled Meds: . chlorhexidine  15 mL Mouth Rinse BID  . Chlorhexidine Gluconate Cloth  6 each Topical Q0600  . epoetin (EPOGEN/PROCRIT) injection  10,000 Units Intravenous Q T,Th,Sa-HD  . feeding supplement (PRO-STAT SUGAR FREE 64)  30 mL Per Tube BID  . [START ON 12/19/2019] levothyroxine  50 mcg Intravenous Daily  . mouth rinse  15 mL Mouth Rinse q12n4p  . pantoprazole (PROTONIX) IV  40 mg Intravenous Q24H  . sodium chloride flush  10-40 mL Intracatheter Q12H   Continuous Infusions: . sodium chloride Stopped (12/12/19 2140)  . albumin human Stopped (12/15/19 1209)  . ceFEPime (MAXIPIME) IV Stopped (12/17/19 2241)  . feeding supplement (NEPRO CARB STEADY) 25 mL/hr at 12/18/19 1200  . metronidazole Stopped (12/18/19 0947)    Assessment/Plan:  1. Septic shock present on admission.  Blood cultures positive for Proteus mirabilis and Citrobacter.  Patient on Maxipime and Flagyl.  Source is the stage IV decubitus (present on admission) and abscess.  Looks like infectious disease spoke with IR and will consider an IR drain on Monday. 2. Acute metabolic encephalopathy.  Likely from sepsis.  Underlying dementia.  MRI of the brain negative for stroke 3. Failure to thrive.  NG tube placed for tube feeding. 4. End-stage renal disease on hemodialysis Tuesday Thursday Saturday.  Having dialysis today. 5. Anemia of chronic disease.  Continue to monitor hemoglobin. 6. Hypothyroidism unspecified looks like she takes Synthroid at home 100 mcg I will start Synthroid 50 mcg IV daily starting tomorrow and check  a TSH in the morning. 7. Nurse noted some ST depression ST elevations seen on monitor.  EKG did not show ST elevation.  Pressure Injury 11/19/19 Sacrum Mid;Right Unstageable - Full thickness tissue loss in which the base of the injury is covered by slough (yellow, tan, gray, green or brown) and/or eschar (tan, brown or black) in the wound bed. deep wound with bed covered  (Active)  11/19/19 1800  Location: Sacrum  Location Orientation: Mid;Right  Staging: Unstageable - Full thickness tissue loss in which the base of the injury is covered by slough (yellow, tan, gray, green or brown) and/or eschar (tan, brown or black) in the wound bed.  Wound Description (Comments): deep wound with bed covered by slough  Present on Admission: Yes     Pressure Injury 11/19/19 Heel Left Deep Tissue Pressure Injury - Purple or maroon localized area of discolored intact skin or blood-filled blister due to damage of underlying soft tissue from pressure and/or shear. (Active)  11/19/19 1800  Location: Heel  Location Orientation: Left  Staging: Deep Tissue Pressure Injury - Purple or maroon localized area of discolored intact skin or blood-filled blister due to damage of underlying soft tissue from pressure and/or shear.  Wound Description (Comments):   Present on Admission: Yes       Code Status:     Code Status Orders  (From admission, onward)         Start     Ordered   12/11/19 2044  Full code  Continuous     12/11/19 2048        Code Status History    Date Active Date Inactive Code Status Order ID Comments User Context   11/19/2019 1527 11/23/2019 2115 Full Code [id number]  Collier Bullock, MD ED   11/03/2019 1234 11/09/2019 2034 Full Code [id number]  Collier Bullock, MD ED   05/31/2019 0016 07/08/2019  1758 Full Code [id number]  Rise Patience, MD ED   06/05/2018 1409 06/12/2018 1255 Full Code [id number]  Thurnell Lose, MD Inpatient   06/05/2018 1358 06/05/2018 1409 Full Code [id number]  Thurnell Lose, MD Inpatient   06/04/2018 0559 06/05/2018 1326 Full Code [id number]  Harrie Foreman, MD Inpatient   03/29/2017 2104 04/07/2017 2125 Full Code [id number]  Everrett Coombe, MD Inpatient   02/24/2017 1450 03/03/2017 1532 Full Code [id number]  Elpidio Eric Inpatient   02/24/2017 1450 02/24/2017 1450 Full Code [id number]  Elpidio Eric Inpatient   01/15/2017 1145 01/16/2017 2314 Full Code [id number]  Samella Parr, NP Inpatient   07/06/2016 1908 07/10/2016 1736 Full Code [id number]  Nicholes Mango, MD Inpatient   Advance Care Planning Activity     Family Communication: Spoke with family Disposition Plan: Status is: Inpatient  Dispo: The patient is from: Acute rehab              Anticipated d/c is to: Potentially back to rehab              Anticipated d/c date is: Unknown at this point              Patient currently not stable enough for disposition at this point time.  The patient is still unresponsive.  Everything will key off her mental status on whether she will be able to eat.  Patient will need long-term antibiotics.  Patient is high risk for cardiopulmonary arrest.  Consultants:  Nephrology  Infectious disease  Palliative care  Antibiotics:  Cefepime  Flagyl  Time spent: 28 minutes  [place]

## 2019-12-18 NOTE — Progress Notes (Signed)
[facility]  ROUNDING NOTE   Subjective:   Hemodialysis treatment yesterday. Unable to get any ultrafiltration. Kept even.   Started on tube feeds.   Objective:  Vital signs in last 24 hours:  Temp:  [97.8 F (36.6 C)-98.7 F (37.1 C)] 98.2 F (36.8 C) (05/16 0800) Pulse Rate:  [73-110] 87 (05/16 0800) Resp:  [11-20] 11 (05/16 0800) BP: (97-149)/(46-85) 135/55 (05/16 0800) SpO2:  [96 %-100 %] 100 % (05/16 0800)  Weight change:  Filed Weights   12/15/19 0119 12/15/19 1700 12/16/19 0152  Weight: 79 kg 79 kg 79.9 kg    Intake/Output: I/O last 3 completed shifts: In: 1721.5 [I.V.:954.7; IV Piggyback:766.8] Out: 212 [Other:212]   Intake/Output this shift:  Total I/O In: 94.2 [I.V.:94.2] Out: -   Physical Exam: General: ill-appearing  Head: +NGT  Eyes: Anicteric  Neck: Supple, trachea midline  Lungs:  Clear to auscultation, normal effort  Heart: regular  Abdomen:  +ventral hernia  Extremities: 1+ peripheral edema.  Neurologic: Lethargic, opens eyes to verbal stimuli. No tracking  Skin: Warm/dry, sacral wound not examined (pictures reviewed)  Access: Right IJ PermCath    Basic Metabolic Panel: Recent Labs  Lab 12/14/19 0600 12/14/19 0600 12/15/19 0414 12/15/19 0414 12/16/19 0343 12/16/19 0900 12/17/19 0447 12/17/19 1131 12/18/19 0709  NA 134*  --  131*  --  136  --   --  134* 139  K 2.9*   < > 3.4*  --  2.7* 3.0*  --  3.6 3.0*  CL 100  --  99  --  101  --   --  103 105  CO2 26  --  24  --  28  --   --  24 27  GLUCOSE 77  --  106*  --  105*  --   --  117* 177*  BUN 14  --  16  --  8  --   --  10 6*  CREATININE 1.80*  --  2.17*  --  1.44*  --   --  2.07* 1.38*  CALCIUM 7.2*   < > 7.2*   < > 7.3*  --   --  7.6* 7.5*  MG 2.1  --  1.9  --  1.6*  --  2.0  --  1.9  PHOS 1.6*   < > 3.5  --  2.1*  --  3.4 3.7 1.9*   < > = values in this interval not displayed.    Liver Function Tests: Recent Labs  Lab 12/11/19 1945 12/17/19 1131  AST 30   --   ALT 23  --   ALKPHOS 90  --   BILITOT 0.4  --   PROT 5.1*  --   ALBUMIN 1.5* 1.7*   No results for input(s): LIPASE, AMYLASE in the last 168 hours. Recent Labs  Lab 12/11/19 1945 12/18/19 0709  AMMONIA 44* 61*    CBC: Recent Labs  Lab 12/14/19 0600 12/14/19 0600 12/15/19 0414 12/16/19 0343 12/17/19 0447 12/17/19 1131 12/18/19 0709  WBC 8.8   < > 10.1 10.5 9.7 13.2* 11.7*  NEUTROABS 6.7  --  8.6* 7.9* 6.6  --  8.0*  HGB 6.7*   < > 7.3* 7.1* 7.2* 7.4* 7.3*  HCT 22.6*   < > 24.4* 22.5* 23.9* 24.7* 24.8*  MCV 89.7   < > 89.1 84.6 88.5 89.2 91.2  PLT 82*   < > 73* 77* 112* 133* 162   < > = values in this  interval not displayed.    Cardiac Enzymes: No results for input(s): CKTOTAL, CKMB, CKMBINDEX, TROPONINI in the last 168 hours.  BNP: Invalid input(s): POCBNP  CBG: Recent Labs  Lab 12/17/19 1607 12/17/19 1939 12/18/19 0004 12/18/19 0348 12/18/19 0740  GLUCAP 100* 103* 129* 156* 165*    Microbiology: Results for orders placed or performed during the hospital encounter of 12/11/19  Blood Culture (routine x 2)     Status: Abnormal   Collection Time: 12/11/19  7:45 PM   Specimen: BLOOD  Result Value Ref Range Status   Specimen Description   Final    BLOOD RIGHT FA Performed at [facility], [address] [place], [place] [address]    Special Requests   Final    BOTTLES DRAWN AEROBIC AND ANAEROBIC Blood Culture adequate volume Performed at [facility], [address] [place], [place] [address]    Culture  Setup Time   Final    GRAM NEGATIVE RODS IN BOTH AEROBIC AND ANAEROBIC BOTTLES CRITICAL RESULT CALLED TO, READ BACK BY AND VERIFIED WITH: Rayna Sexton AT 5300 ON 12/12/19 SNG    Culture (A)  Final    PROTEUS MIRABILIS CITROBACTER YOUNGAE SUSCEPTIBILITIES PERFORMED ON PREVIOUS CULTURE WITHIN THE LAST 5 DAYS. Performed at [facility], [place] [address], [place], [place] [address]    Report Status 12/16/2019 FINAL  Final   Blood Culture (routine x 2)     Status: Abnormal   Collection Time: 12/11/19  7:45 PM   Specimen: BLOOD  Result Value Ref Range Status   Specimen Description   Final    BLOOD RIGHT ARM Performed at [facility], [address], [place], [place] [address]    Special Requests   Final    BOTTLES DRAWN AEROBIC AND ANAEROBIC Blood Culture adequate volume Performed at [facility], [address], [place], [place] [address]    Culture  Setup Time   Final    GRAM NEGATIVE RODS IN BOTH AEROBIC AND ANAEROBIC BOTTLES CRITICAL VALUE NOTED.  VALUE IS CONSISTENT WITH PREVIOUSLY REPORTED AND CALLED VALUE. Performed at [facility], [address] [place], [place] [address]    Culture PROTEUS MIRABILIS Su Hoff  (A)  Final   Report Status 12/16/2019 FINAL  Final   Organism ID, Bacteria PROTEUS MIRABILIS  Final   Organism ID, Bacteria CITROBACTER YOUNGAE  Final      Susceptibility   Citrobacter youngae - MIC*    CEFAZOLIN >=64 RESISTANT Resistant     CEFEPIME <=1 SENSITIVE Sensitive     CEFTAZIDIME >=64 RESISTANT Resistant     CEFTRIAXONE >=64 RESISTANT Resistant     CIPROFLOXACIN <=0.25 SENSITIVE Sensitive     GENTAMICIN <=1 SENSITIVE Sensitive     IMIPENEM 1 SENSITIVE Sensitive     TRIMETH/SULFA <=20 SENSITIVE Sensitive     PIP/TAZO 64 INTERMEDIATE Intermediate     * CITROBACTER YOUNGAE   Proteus mirabilis - MIC*    AMPICILLIN <=2 SENSITIVE Sensitive     CEFAZOLIN <=4 SENSITIVE Sensitive     CEFEPIME <=1 SENSITIVE Sensitive     CEFTAZIDIME <=1 SENSITIVE Sensitive     CEFTRIAXONE <=1 SENSITIVE Sensitive     CIPROFLOXACIN <=0.25 SENSITIVE Sensitive     GENTAMICIN <=1 SENSITIVE Sensitive     IMIPENEM 4 SENSITIVE Sensitive     TRIMETH/SULFA <=20 SENSITIVE Sensitive     AMPICILLIN/SULBACTAM <=2 SENSITIVE Sensitive     PIP/TAZO <=4 SENSITIVE Sensitive     * PROTEUS MIRABILIS  Blood Culture ID Panel (Reflexed)     Status: Abnormal   Collection  Time: 12/11/19  7:45 PM  Result Value Ref Range Status   Enterococcus species NOT DETECTED NOT DETECTED Final   Listeria monocytogenes NOT DETECTED NOT DETECTED Final   Staphylococcus species NOT DETECTED NOT DETECTED Final   Staphylococcus aureus (BCID) NOT DETECTED NOT DETECTED Final   Streptococcus species NOT DETECTED NOT DETECTED Final   Streptococcus agalactiae NOT DETECTED NOT DETECTED Final   Streptococcus pneumoniae NOT DETECTED NOT DETECTED Final   Streptococcus pyogenes NOT DETECTED NOT DETECTED Final   Acinetobacter baumannii NOT DETECTED NOT DETECTED Final   Enterobacteriaceae species DETECTED (A) NOT DETECTED Final    Comment: Enterobacteriaceae represent a large family of gram-negative bacteria, not a single organism. CRITICAL RESULT CALLED TO, READ BACK BY AND VERIFIED WITH: Rayna Sexton AT 2119 ON 12/12/19 SNG    Enterobacter cloacae complex NOT DETECTED NOT DETECTED Final   Escherichia coli NOT DETECTED NOT DETECTED Final   Klebsiella oxytoca NOT DETECTED NOT DETECTED Final   Klebsiella pneumoniae NOT DETECTED NOT DETECTED Final   Proteus species DETECTED (A) NOT DETECTED Final    Comment: CRITICAL RESULT CALLED TO, READ BACK BY AND VERIFIED WITH: Rayna Sexton AT 4174 ON 12/12/19 SNG    Serratia marcescens NOT DETECTED NOT DETECTED Final   Carbapenem resistance NOT DETECTED NOT DETECTED Final   Haemophilus influenzae NOT DETECTED NOT DETECTED Final   Neisseria meningitidis NOT DETECTED NOT DETECTED Final   Pseudomonas aeruginosa NOT DETECTED NOT DETECTED Final   Candida albicans NOT DETECTED NOT DETECTED Final   Candida glabrata NOT DETECTED NOT DETECTED Final   Candida krusei NOT DETECTED NOT DETECTED Final   Candida parapsilosis NOT DETECTED NOT DETECTED Final   Candida tropicalis NOT DETECTED NOT DETECTED Final    Comment: Performed at [facility], [address] [place], [place] [address]  Urine culture     Status: Abnormal   Collection Time: 12/11/19   8:09 PM   Specimen: In/Out Cath Urine  Result Value Ref Range Status   Specimen Description   Final    IN/OUT CATH URINE Performed at [facility], [address] [place], [place] [address]    Special Requests   Final    NONE Performed at [facility], [address] [place], [place] [address]    Culture MULTIPLE SPECIES PRESENT, SUGGEST RECOLLECTION (A)  Final   Report Status 12/12/2019 FINAL  Final  Respiratory Panel by RT PCR (Flu A&B, Covid) - Nasopharyngeal Swab     Status: None   Collection Time: 12/11/19  9:17 PM   Specimen: Nasopharyngeal Swab  Result Value Ref Range Status   SARS Coronavirus 2 by RT PCR NEGATIVE NEGATIVE Final    Comment: (NOTE) SARS-CoV-2 target nucleic acids are NOT DETECTED. The SARS-CoV-2 RNA is generally detectable in upper respiratoy specimens during the acute phase of infection. The lowest concentration of SARS-CoV-2 viral copies this assay can detect is 131 copies/mL. A negative result does not preclude SARS-Cov-2 infection and should not be used as the sole basis for treatment or other patient management decisions. A negative result may occur with  improper specimen collection/handling, submission of specimen other than nasopharyngeal swab, presence of viral mutation(s) within the areas targeted by this assay, and inadequate number of viral copies (<131 copies/mL). A negative result must be combined with clinical observations, patient history, and epidemiological information. The expected result is Negative. Fact Sheet for Patients:  PinkCheek.be Fact  Sheet for Healthcare Providers:  GravelBags.it This test is not yet ap proved or cleared by the [place] FDA and  has been authorized for detection and/or diagnosis of SARS-CoV-2 by FDA under an Emergency Use Authorization (EUA). This EUA will remain  in effect (meaning this test can be used) for the duration of  the COVID-19 declaration under Section 564(b)(1) of the Act, 21 U.S.C. section 360bbb-3(b)(1), unless the authorization is terminated or revoked sooner.    Influenza A by PCR NEGATIVE NEGATIVE Final   Influenza B by PCR NEGATIVE NEGATIVE Final    Comment: (NOTE) The Xpert Xpress SARS-CoV-2/FLU/RSV assay is intended as an aid in  the diagnosis of influenza from Nasopharyngeal swab specimens and  should not be used as a sole basis for treatment. Nasal washings and  aspirates are unacceptable for Xpert Xpress SARS-CoV-2/FLU/RSV  testing. Fact Sheet for Patients: PinkCheek.be Fact Sheet for Healthcare Providers: GravelBags.it This test is not yet approved or cleared by the [place] FDA and  has been authorized for detection and/or diagnosis of SARS-CoV-2 by  FDA under an Emergency Use Authorization (EUA). This EUA will remain  in effect (meaning this test can be used) for the duration of the  Covid-19 declaration under Section 564(b)(1) of the Act, 21  U.S.C. section 360bbb-3(b)(1), unless the authorization is  terminated or revoked. Performed at [facility], [address] [place], [place] [address]   MRSA PCR Screening     Status: None   Collection Time: 12/12/19 12:29 AM   Specimen: Nasopharyngeal  Result Value Ref Range Status   MRSA by PCR NEGATIVE NEGATIVE Final    Comment:        The GeneXpert MRSA Assay (FDA approved for NASAL specimens only), is one component of a comprehensive MRSA colonization surveillance program. It is not intended to diagnose MRSA infection nor to guide or monitor treatment for MRSA infections. Performed at [facility], [address], [place], [place] [address]   Aerobic/Anaerobic Culture (surgical/deep wound)     Status: None   Collection Time: 12/12/19  2:06 AM   Specimen: Sacral; Wound  Result Value Ref Range Status   Specimen Description   Final     SACRAL Performed at [facility], [address] [place], [place] [address]    Special Requests   Final    NONE Performed at [facility], [place], [place] [address]    Gram Stain NO WBC SEEN NO ORGANISMS SEEN   Final   Culture   Final    FEW ESCHERICHIA COLI FEW CITROBACTER SPECIES FEW PROTEUS MIRABILIS FEW BACTEROIDES OVATUS BETA LACTAMASE POSITIVE Performed at [facility], [place] [address], [place], [place] [address]    Report Status 12/15/2019 FINAL  Final   Organism ID, Bacteria ESCHERICHIA COLI  Final   Organism ID, Bacteria CITROBACTER SPECIES  Final   Organism ID, Bacteria PROTEUS MIRABILIS  Final      Susceptibility   Citrobacter species - MIC*    CEFAZOLIN >=64 RESISTANT Resistant     CEFEPIME <=1 SENSITIVE Sensitive     CEFTAZIDIME >=64 RESISTANT Resistant     CEFTRIAXONE >=64 RESISTANT Resistant     CIPROFLOXACIN <=0.25 SENSITIVE Sensitive     GENTAMICIN <=1 SENSITIVE Sensitive     IMIPENEM <=0.25 SENSITIVE Sensitive     TRIMETH/SULFA <=20 SENSITIVE Sensitive     PIP/TAZO 64 INTERMEDIATE Intermediate     * FEW CITROBACTER SPECIES   Escherichia coli -  MIC*    AMPICILLIN 16 INTERMEDIATE Intermediate     CEFAZOLIN >=64 RESISTANT Resistant     CEFEPIME <=1 SENSITIVE Sensitive     CEFTAZIDIME <=1 SENSITIVE Sensitive     CEFTRIAXONE <=1 SENSITIVE Sensitive     CIPROFLOXACIN <=0.25 SENSITIVE Sensitive     GENTAMICIN <=1 SENSITIVE Sensitive     IMIPENEM <=0.25 SENSITIVE Sensitive     TRIMETH/SULFA <=20 SENSITIVE Sensitive     AMPICILLIN/SULBACTAM 4 SENSITIVE Sensitive     PIP/TAZO <=4 SENSITIVE Sensitive     * FEW ESCHERICHIA COLI   Proteus mirabilis - MIC*    AMPICILLIN <=2 SENSITIVE Sensitive     CEFAZOLIN <=4 SENSITIVE Sensitive     CEFEPIME <=1 SENSITIVE Sensitive     CEFTAZIDIME <=1 SENSITIVE Sensitive     CEFTRIAXONE <=1 SENSITIVE Sensitive     CIPROFLOXACIN <=0.25 SENSITIVE Sensitive     GENTAMICIN 2 SENSITIVE  Sensitive     IMIPENEM 4 SENSITIVE Sensitive     TRIMETH/SULFA <=20 SENSITIVE Sensitive     AMPICILLIN/SULBACTAM <=2 SENSITIVE Sensitive     PIP/TAZO <=4 SENSITIVE Sensitive     * FEW PROTEUS MIRABILIS  Aerobic/Anaerobic Culture (surgical/deep wound)     Status: None   Collection Time: 12/12/19  2:06 AM   Specimen: Back; Wound  Result Value Ref Range Status   Specimen Description   Final    BACK LUMBAR SPINE Performed at [facility], [address] [place], [place] [address]    Special Requests   Final    NONE Performed at [facility], [place], [place] [address]    Gram Stain NO WBC SEEN NO ORGANISMS SEEN   Final   Culture   Final    FEW PROTEUS MIRABILIS NO ANAEROBES ISOLATED Performed at [facility], [place] [address] [place], [place] [address]    Report Status 12/17/2019 FINAL  Final   Organism ID, Bacteria PROTEUS MIRABILIS  Final      Susceptibility   Proteus mirabilis - MIC*    AMPICILLIN <=2 SENSITIVE Sensitive     CEFAZOLIN <=4 SENSITIVE Sensitive     CEFEPIME <=1 SENSITIVE Sensitive     CEFTAZIDIME <=1 SENSITIVE Sensitive     CEFTRIAXONE <=1 SENSITIVE Sensitive     CIPROFLOXACIN <=0.25 SENSITIVE Sensitive     GENTAMICIN 2 SENSITIVE Sensitive     IMIPENEM 4 SENSITIVE Sensitive     TRIMETH/SULFA <=20 SENSITIVE Sensitive     AMPICILLIN/SULBACTAM <=2 SENSITIVE Sensitive     PIP/TAZO <=4 SENSITIVE Sensitive     * FEW PROTEUS MIRABILIS    Coagulation Studies: No results for input(s): LABPROT, INR in the last 72 hours.  Urinalysis: No results for input(s): COLORURINE, LABSPEC, PHURINE, GLUCOSEU, HGBUR, BILIRUBINUR, KETONESUR, PROTEINUR, UROBILINOGEN, NITRITE, LEUKOCYTESUR in the last 72 hours.  Invalid input(s): APPERANCEUR    Imaging: MR BRAIN WO CONTRAST  Result Date: 12/16/2019 CLINICAL DATA:  Encephalopathy. Stroke suspected. EXAM: MRI HEAD WITHOUT CONTRAST TECHNIQUE: Multiplanar, multiecho pulse sequences of  the brain and surrounding structures were obtained without intravenous contrast. COMPARISON:  Head CT 12/11/2019 and MRI 09/08/2019 FINDINGS: Brain: No acute infarct, mass, midline shift, or extra-axial fluid collection is identified. Chronic microhemorrhages in the left parietal and right occipital lobes are unchanged. T2 hyperintensities in the cerebral white matter bilaterally are unchanged and nonspecific but compatible with mild chronic small vessel ischemic disease. Mild-to-moderate cerebral atrophy is again noted. Vascular: Major intracranial vascular flow voids are preserved. Skull and upper cervical spine:  Unremarkable bone marrow signal. Sinuses/Orbits: Right cataract extraction. Minimal fluid in the sphenoid sinuses. Clear mastoid air cells. Other: None. IMPRESSION: 1. No acute intracranial abnormality. 2. Mild chronic small vessel ischemic disease and mild-to-moderate cerebral atrophy. Electronically Signed   By: Logan Bores M.D.   On: 12/16/2019 14:49   DG Abd Portable 1V  Result Date: 12/17/2019 CLINICAL DATA:  NG tube placement. EXAM: PORTABLE ABDOMEN - 1 VIEW COMPARISON:  CT, 12/11/2019. FINDINGS: Nasogastric tube passes below the diaphragm, tip projects in the mid stomach. No bowel dilation. IMPRESSION: Nasal/orogastric tube tip projects in the mid stomach. Electronically Signed   By: Lajean Manes M.D.   On: 12/17/2019 10:37     Medications:   . sodium chloride Stopped (12/12/19 2140)  . albumin human Stopped (12/15/19 1209)  . ceFEPime (MAXIPIME) IV Stopped (12/17/19 2241)  . feeding supplement (NEPRO CARB STEADY) 1,000 mL (12/17/19 1843)  . metronidazole 500 mg (12/18/19 0847)   . chlorhexidine  15 mL Mouth Rinse BID  . Chlorhexidine Gluconate Cloth  6 each Topical Q0600  . epoetin (EPOGEN/PROCRIT) injection  10,000 Units Intravenous Q T,Th,Sa-HD  . feeding supplement (PRO-STAT SUGAR FREE 64)  30 mL Per Tube BID  . mouth rinse  15 mL Mouth Rinse q12n4p  . pantoprazole  (PROTONIX) IV  40 mg Intravenous Q24H  . sodium chloride flush  10-40 mL Intracatheter Q12H   acetaminophen, docusate sodium, ipratropium-albuterol, ipratropium-albuterol, morphine injection, ondansetron (ZOFRAN) IV, polyethylene glycol, sodium chloride flush  Assessment/ Plan:  79 y.o. female  is a 79 y.o. black female with end stage renal disease on hemodialysis, dementia, diabetes mellitus type II, hypertension, hypothyroidism, osteomyelitis, now admitted with decreased responsiveness and sepsis.  [facility] ([place] [address] TTS RIJ [id number].  1.  ESRD on HD TTS.    2.  Altered mental status: MRI negative. On cefepime, consider changing.   3.  Anemia of chronic Tyler disease.  Hemoglobin 7.3 - EPO with HD treatment.   4.  Secondary hyperparathyroidism: PTH and phosphorus are low. Corrected calcium at goal.   5.  Sepsis: proteus and citrobacter in blood culture. Most likely urinary sources.  Off vasopressors - cefepime and metronidazole.   Overall prognosis is very poor despite aggressive measures.    LOS: 7 Sarath Kolluru 5/16/20219:18 AM

## 2019-12-18 NOTE — Progress Notes (Signed)
Shift Summary:  Pt remains non-verbal with eye opening and grimacing during q2 hr turns No contact with family during this RNs shift Plan to have spinal abscess drained in IR tomorrow 5/17 ST evelvation and depression noted during this shift Multiple EKG strips printed and placed in patients chart Dr. Leslye Peer made aware of changes-STAT EKG ordered & completed  Pt's son, Ollen Gross visited at bedside and was updated by Dr. Leslye Peer via phone call

## 2019-12-18 NOTE — Progress Notes (Signed)
Patient had an emesis of tube feeding at this time.  Placed tube feeding on hold.

## 2019-12-18 NOTE — Progress Notes (Signed)
End of Shift Summary:     Since picking up patient at 1500 the patient has remained non-verbal and does not interact during care.  Patient will grimace during repositioning.  Patient had only the one episode of an emesis at 1600.  Tube feeds still on hold as patient is belching at times. Head of bed >30* to prevent risk of aspiration.

## 2019-12-18 NOTE — Progress Notes (Signed)
Vital signs reviewed, ICU needs resolved  Will sign off at this time. No further recommendations at this time.  Please call [phone] for further questions. Thank you.    Owen Pagnotta David Deysy Schabel, M.D.  [facility]  Medical Director ICU-[facility] [place] Medical Director [facility] Cardio-Pulmonary Department   

## 2019-12-19 ENCOUNTER — Inpatient Hospital Stay: Payer: Medicare Other

## 2019-12-19 LAB — CBC WITH DIFFERENTIAL/PLATELET
Abs Immature Granulocytes: 0.26 10*3/uL — ABNORMAL HIGH (ref 0.00–0.07)
Basophils Absolute: 0.1 10*3/uL (ref 0.0–0.1)
Basophils Relative: 1 %
Eosinophils Absolute: 0.1 10*3/uL (ref 0.0–0.5)
Eosinophils Relative: 0 %
HCT: 24.1 % — ABNORMAL LOW (ref 36.0–46.0)
Hemoglobin: 7.3 g/dL — ABNORMAL LOW (ref 12.0–15.0)
Immature Granulocytes: 2 %
Lymphocytes Relative: 19 %
Lymphs Abs: 2.8 10*3/uL (ref 0.7–4.0)
MCH: 26.7 pg (ref 26.0–34.0)
MCHC: 30.3 g/dL (ref 30.0–36.0)
MCV: 88.3 fL (ref 80.0–100.0)
Monocytes Absolute: 1 10*3/uL (ref 0.1–1.0)
Monocytes Relative: 7 %
Neutro Abs: 10.4 10*3/uL — ABNORMAL HIGH (ref 1.7–7.7)
Neutrophils Relative %: 71 %
Platelets: 170 10*3/uL (ref 150–400)
RBC: 2.73 MIL/uL — ABNORMAL LOW (ref 3.87–5.11)
RDW: 22.8 % — ABNORMAL HIGH (ref 11.5–15.5)
Smear Review: NORMAL
WBC: 14.6 10*3/uL — ABNORMAL HIGH (ref 4.0–10.5)
nRBC: 0 % (ref 0.0–0.2)

## 2019-12-19 LAB — GLUCOSE, CAPILLARY
Glucose-Capillary: 105 mg/dL — ABNORMAL HIGH (ref 70–99)
Glucose-Capillary: 117 mg/dL — ABNORMAL HIGH (ref 70–99)
Glucose-Capillary: 105 mg/dL — ABNORMAL HIGH (ref 70–99)
Glucose-Capillary: 95 mg/dL (ref 70–99)
Glucose-Capillary: 99 mg/dL (ref 70–99)

## 2019-12-19 LAB — BASIC METABOLIC PANEL
Anion gap: 7 (ref 5–15)
BUN: 14 mg/dL (ref 8–23)
CO2: 26 mmol/L (ref 22–32)
Calcium: 7.9 mg/dL — ABNORMAL LOW (ref 8.9–10.3)
Chloride: 106 mmol/L (ref 98–111)
Creatinine, Ser: 1.9 mg/dL — ABNORMAL HIGH (ref 0.44–1.00)
GFR calc Af Amer: 29 mL/min — ABNORMAL LOW (ref >60)
GFR calc non Af Amer: 25 mL/min — ABNORMAL LOW (ref >60)
Glucose, Bld: 116 mg/dL — ABNORMAL HIGH (ref 70–99)
Potassium: 3.2 mmol/L — ABNORMAL LOW (ref 3.5–5.1)
Sodium: 139 mmol/L (ref 135–145)

## 2019-12-19 LAB — MAGNESIUM: Magnesium: 1.9 mg/dL (ref 1.7–2.4)

## 2019-12-19 LAB — PHOSPHORUS: Phosphorus: 1.9 mg/dL — ABNORMAL LOW (ref 2.5–4.6)

## 2019-12-19 LAB — TSH: TSH: 6.652 u[IU]/mL — ABNORMAL HIGH (ref 0.350–4.500)

## 2019-12-19 MED ORDER — FENTANYL CITRATE (PF) 100 MCG/2ML IJ SOLN
INTRAMUSCULAR | Status: AC
  Filled 2019-12-19: qty 2

## 2019-12-19 MED ORDER — NEPRO/CARBSTEADY PO LIQD
1000.0000 mL | ORAL | Status: DC
  Administered 2019-12-19: 1000 mL

## 2019-12-19 MED ORDER — MIDAZOLAM HCL 2 MG/2ML IJ SOLN
INTRAMUSCULAR | Status: AC
  Filled 2019-12-19: qty 2

## 2019-12-19 NOTE — Progress Notes (Signed)
   12/19/19 0027  Assess: MEWS Score  Temp 98.1 F (36.7 C)  BP (!) 126/59  Pulse Rate (!) 103  Resp 16  SpO2 99 %  Assess: MEWS Score  MEWS Temp 0  MEWS Systolic 0  MEWS Pulse 1  MEWS RR 0  MEWS LOC 1  MEWS Score 2  MEWS Score Color Yellow  Assess: if the MEWS score is Yellow or Red  Were vital signs taken at a resting state? Yes  Focused Assessment Documented focused assessment  Early Detection of Sepsis Score *See Row Information* Medium  MEWS guidelines implemented *See Row Information* Yes  Treat  MEWS Interventions Escalated (See documentation below)  Take Vital Signs  Increase Vital Sign Frequency  Yellow: Q 2hr X 2 then Q 4hr X 2, if remains yellow, continue Q 4hrs  Escalate  MEWS: Escalate Yellow: discuss with charge nurse/RN and consider discussing with provider and RRT  Notify: Charge Nurse/RN  Name of Charge Nurse/RN Notified De [place] RN  Date Charge Nurse/RN Notified 12/19/19  Time Charge Nurse/RN Notified 0300  Document  Patient Outcome Stabilized after interventions  Progress note created (see row info) Yes

## 2019-12-19 NOTE — Progress Notes (Signed)
[name], female   DOB: [birth year], 79 y.o.   MRN: [id number] [facility] PROGRESS NOTE  Taylor Tyler WYO:[id number] DOB: 22-Mar-1941 DOA: 12/11/2019 PCP: McLean-Scocuzza, Nino Glow, MD  HPI/Subjective: Patient again opened her eyes to voice.  Unable to talk or follow commands.  When I lifted up her arms and let go her right arm flopped down but her left arm went down slowly to the pillow.  Objective: Vitals:   12/19/19 0733 12/19/19 1145  BP: (!) 144/51 129/63  Pulse: 91 88  Resp: 16 16  Temp: (!) 97.3 F (36.3 C) 97.9 F (36.6 C)  SpO2: 98% 100%    Intake/Output Summary (Last 24 hours) at 12/19/2019 1304 Last data filed at 12/19/2019 0600 Gross per 24 hour  Intake 289.91 ml  Output --  Net 289.91 ml   Filed Weights   12/15/19 1700 12/16/19 0152 12/19/19 0500  Weight: 79 kg 79.9 kg 86.7 kg    ROS: Review of Systems  Unable to perform ROS: Acuity of condition   Exam: Physical Exam  Constitutional: She appears lethargic.  HENT:  Nose: No mucosal edema.  Eyes:  Patient closed her eyes when she was trying to look in them.  Again resisted me opening them  Neck: Carotid bruit is not present.  Cardiovascular: Regular rhythm, S1 normal, S2 normal and normal heart sounds.  Respiratory: No respiratory distress. She has decreased breath sounds in the right lower field and the left lower field. She has no wheezes. She has no rhonchi. She has no rales.  GI: Soft. Bowel sounds are normal. There is no abdominal tenderness.  Musculoskeletal:     Right ankle: Swelling present.     Left ankle: Swelling present.  Lymphadenopathy:    She has no cervical adenopathy.  Neurological: She appears lethargic.  Opened eyes to voice.  When I lifted her right arm and if feel straight down to the pillow.  When I lifted her left arm, it went down to the pillow very slowly.  Skin: Skin is warm.  As per pictures large stage IV decubitus on buttocks  Psychiatric:  Opened eyes  today      Data Reviewed: Basic Metabolic Panel: Recent Labs  Lab 12/15/19 0414 12/15/19 0414 12/16/19 0343 12/16/19 0900 12/17/19 0447 12/17/19 1131 12/18/19 0709 12/19/19 1036  NA 131*  --  136  --   --  134* 139 139  K 3.4*   < > 2.7* 3.0*  --  3.6 3.0* 3.2*  CL 99  --  101  --   --  103 105 106  CO2 24  --  28  --   --  24 27 26   GLUCOSE 106*  --  105*  --   --  117* 177* 116*  BUN 16  --  8  --   --  10 6* 14  CREATININE 2.17*  --  1.44*  --   --  2.07* 1.38* 1.90*  CALCIUM 7.2*  --  7.3*  --   --  7.6* 7.5* 7.9*  MG 1.9  --  1.6*  --  2.0  --  1.9 1.9  PHOS 3.5   < > 2.1*  --  3.4 3.7 1.9* 1.9*   < > = values in this interval not displayed.   Liver Function Tests: Recent Labs  Lab 12/17/19 1131  ALBUMIN 1.7*    Recent Labs  Lab 12/18/19 0709  AMMONIA 61*   CBC: Recent Labs  Lab 12/15/19 0414 12/15/19 0414 12/16/19 0343 12/17/19 0447 12/17/19 1131 12/18/19 0709 12/19/19 1036  WBC 10.1   < > 10.5 9.7 13.2* 11.7* 14.6*  NEUTROABS 8.6*  --  7.9* 6.6  --  8.0* 10.4*  HGB 7.3*   < > 7.1* 7.2* 7.4* 7.3* 7.3*  HCT 24.4*   < > 22.5* 23.9* 24.7* 24.8* 24.1*  MCV 89.1   < > 84.6 88.5 89.2 91.2 88.3  PLT 73*   < > 77* 112* 133* 162 170   < > = values in this interval not displayed.   BNP (last 3 results) Recent Labs    05/30/19 2056  BNP 851.4*    CBG: Recent Labs  Lab 12/18/19 2128 12/18/19 2358 12/19/19 0611 12/19/19 0730 12/19/19 1143  GLUCAP 169* 144* 105* 117* 105*    Recent Results (from the past 240 hour(s))  Blood Culture (routine x 2)     Status: Abnormal   Collection Time: 12/11/19  7:45 PM   Specimen: BLOOD  Result Value Ref Range Status   Specimen Description   Final    BLOOD RIGHT FA Performed at [facility], [address] [place], [place] [address]    Special Requests   Final    BOTTLES DRAWN AEROBIC AND ANAEROBIC Blood Culture adequate volume Performed at [facility], [address]  [place], [place] [address]    Culture  Setup Time   Final    GRAM NEGATIVE RODS IN BOTH AEROBIC AND ANAEROBIC BOTTLES CRITICAL RESULT CALLED TO, READ BACK BY AND VERIFIED WITH: Rayna Sexton AT 4235 ON 12/12/19 SNG    Culture (A)  Final    PROTEUS MIRABILIS CITROBACTER YOUNGAE SUSCEPTIBILITIES PERFORMED ON PREVIOUS CULTURE WITHIN THE LAST 5 DAYS. Performed at [facility], [place] [address] [place], [place] [address]    Report Status 12/16/2019 FINAL  Final  Blood Culture (routine x 2)     Status: Abnormal   Collection Time: 12/11/19  7:45 PM   Specimen: BLOOD  Result Value Ref Range Status   Specimen Description   Final    BLOOD RIGHT ARM Performed at [facility], [address] [place], [place] [address]    Special Requests   Final    BOTTLES DRAWN AEROBIC AND ANAEROBIC Blood Culture adequate volume Performed at [facility], [address] [place], [place] [address]    Culture  Setup Time   Final    GRAM NEGATIVE RODS IN BOTH AEROBIC AND ANAEROBIC BOTTLES CRITICAL VALUE NOTED.  VALUE IS CONSISTENT WITH PREVIOUSLY REPORTED AND CALLED VALUE. Performed at [facility], [address] [place], [place] [address]    Culture PROTEUS MIRABILIS Su Hoff  (A)  Final   Report Status 12/16/2019 FINAL  Final   Organism ID, Bacteria PROTEUS MIRABILIS  Final   Organism ID, Bacteria CITROBACTER YOUNGAE  Final      Susceptibility   Citrobacter youngae - MIC*    CEFAZOLIN >=64 RESISTANT Resistant     CEFEPIME <=1 SENSITIVE Sensitive     CEFTAZIDIME >=64 RESISTANT Resistant     CEFTRIAXONE >=64 RESISTANT Resistant     CIPROFLOXACIN <=0.25 SENSITIVE Sensitive     GENTAMICIN <=1 SENSITIVE Sensitive     IMIPENEM 1 SENSITIVE Sensitive     TRIMETH/SULFA <=20 SENSITIVE Sensitive     PIP/TAZO 64 INTERMEDIATE Intermediate     * CITROBACTER YOUNGAE   Proteus mirabilis - MIC*    AMPICILLIN <=2 SENSITIVE Sensitive  CEFAZOLIN <=4 SENSITIVE Sensitive      CEFEPIME <=1 SENSITIVE Sensitive     CEFTAZIDIME <=1 SENSITIVE Sensitive     CEFTRIAXONE <=1 SENSITIVE Sensitive     CIPROFLOXACIN <=0.25 SENSITIVE Sensitive     GENTAMICIN <=1 SENSITIVE Sensitive     IMIPENEM 4 SENSITIVE Sensitive     TRIMETH/SULFA <=20 SENSITIVE Sensitive     AMPICILLIN/SULBACTAM <=2 SENSITIVE Sensitive     PIP/TAZO <=4 SENSITIVE Sensitive     * PROTEUS MIRABILIS  Blood Culture ID Panel (Reflexed)     Status: Abnormal   Collection Time: 12/11/19  7:45 PM  Result Value Ref Range Status   Enterococcus species NOT DETECTED NOT DETECTED Final   Listeria monocytogenes NOT DETECTED NOT DETECTED Final   Staphylococcus species NOT DETECTED NOT DETECTED Final   Staphylococcus aureus (BCID) NOT DETECTED NOT DETECTED Final   Streptococcus species NOT DETECTED NOT DETECTED Final   Streptococcus agalactiae NOT DETECTED NOT DETECTED Final   Streptococcus pneumoniae NOT DETECTED NOT DETECTED Final   Streptococcus pyogenes NOT DETECTED NOT DETECTED Final   Acinetobacter baumannii NOT DETECTED NOT DETECTED Final   Enterobacteriaceae species DETECTED (A) NOT DETECTED Final    Comment: Enterobacteriaceae represent a large family of gram-negative bacteria, not a single organism. CRITICAL RESULT CALLED TO, READ BACK BY AND VERIFIED WITH: Rayna Sexton AT 6546 ON 12/12/19 SNG    Enterobacter cloacae complex NOT DETECTED NOT DETECTED Final   Escherichia coli NOT DETECTED NOT DETECTED Final   Klebsiella oxytoca NOT DETECTED NOT DETECTED Final   Klebsiella pneumoniae NOT DETECTED NOT DETECTED Final   Proteus species DETECTED (A) NOT DETECTED Final    Comment: CRITICAL RESULT CALLED TO, READ BACK BY AND VERIFIED WITH: Rayna Sexton AT 5035 ON 12/12/19 SNG    Serratia marcescens NOT DETECTED NOT DETECTED Final   Carbapenem resistance NOT DETECTED NOT DETECTED Final   Haemophilus influenzae NOT DETECTED NOT DETECTED Final   Neisseria meningitidis NOT DETECTED NOT DETECTED Final    Pseudomonas aeruginosa NOT DETECTED NOT DETECTED Final   Candida albicans NOT DETECTED NOT DETECTED Final   Candida glabrata NOT DETECTED NOT DETECTED Final   Candida krusei NOT DETECTED NOT DETECTED Final   Candida parapsilosis NOT DETECTED NOT DETECTED Final   Candida tropicalis NOT DETECTED NOT DETECTED Final    Comment: Performed at [facility], [address] [place], [place] [address]  Urine culture     Status: Abnormal   Collection Time: 12/11/19  8:09 PM   Specimen: In/Out Cath Urine  Result Value Ref Range Status   Specimen Description   Final    IN/OUT CATH URINE Performed at [facility], [address] [place], [place] [address]    Special Requests   Final    NONE Performed at [facility], [address] [place], [place] [address]    Culture MULTIPLE SPECIES PRESENT, SUGGEST RECOLLECTION (A)  Final   Report Status 12/12/2019 FINAL  Final  Respiratory Panel by RT PCR (Flu A&B, Covid) - Nasopharyngeal Swab     Status: None   Collection Time: 12/11/19  9:17 PM   Specimen: Nasopharyngeal Swab  Result Value Ref Range Status   SARS Coronavirus 2 by RT PCR NEGATIVE NEGATIVE Final    Comment: (NOTE) SARS-CoV-2 target nucleic acids are NOT DETECTED. The SARS-CoV-2 RNA is generally detectable in upper respiratoy specimens during the acute phase of infection. The lowest concentration of SARS-CoV-2 viral copies this assay can detect is 131 copies/mL. A negative  result does not preclude SARS-Cov-2 infection and should not be used as the sole basis for treatment or other patient management decisions. A negative result may occur with  improper specimen collection/handling, submission of specimen other than nasopharyngeal swab, presence of viral mutation(s) within the areas targeted by this assay, and inadequate number of viral copies (<131 copies/mL). A negative result must be combined with clinical observations, patient history, and  epidemiological information. The expected result is Negative. Fact Sheet for Patients:  PinkCheek.be Fact Sheet for Healthcare Providers:  GravelBags.it This test is not yet ap proved or cleared by the [place] FDA and  has been authorized for detection and/or diagnosis of SARS-CoV-2 by FDA under an Emergency Use Authorization (EUA). This EUA will remain  in effect (meaning this test can be used) for the duration of the COVID-19 declaration under Section 564(b)(1) of the Act, 21 U.S.C. section 360bbb-3(b)(1), unless the authorization is terminated or revoked sooner.    Influenza A by PCR NEGATIVE NEGATIVE Final   Influenza B by PCR NEGATIVE NEGATIVE Final    Comment: (NOTE) The Xpert Xpress SARS-CoV-2/FLU/RSV assay is intended as an aid in  the diagnosis of influenza from Nasopharyngeal swab specimens and  should not be used as a sole basis for treatment. Nasal washings and  aspirates are unacceptable for Xpert Xpress SARS-CoV-2/FLU/RSV  testing. Fact Sheet for Patients: PinkCheek.be Fact Sheet for Healthcare Providers: GravelBags.it This test is not yet approved or cleared by the [place] FDA and  has been authorized for detection and/or diagnosis of SARS-CoV-2 by  FDA under an Emergency Use Authorization (EUA). This EUA will remain  in effect (meaning this test can be used) for the duration of the  Covid-19 declaration under Section 564(b)(1) of the Act, 21  U.S.C. section 360bbb-3(b)(1), unless the authorization is  terminated or revoked. Performed at [facility], [address] [place], [place] [address]   MRSA PCR Screening     Status: None   Collection Time: 12/12/19 12:29 AM   Specimen: Nasopharyngeal  Result Value Ref Range Status   MRSA by PCR NEGATIVE NEGATIVE Final    Comment:        The GeneXpert MRSA Assay (FDA approved for  NASAL specimens only), is one component of a comprehensive MRSA colonization surveillance program. It is not intended to diagnose MRSA infection nor to guide or monitor treatment for MRSA infections. Performed at [facility], [address], [place], [place] [address]   Aerobic/Anaerobic Culture (surgical/deep wound)     Status: None   Collection Time: 12/12/19  2:06 AM   Specimen: Sacral; Wound  Result Value Ref Range Status   Specimen Description   Final    SACRAL Performed at [facility], [address] [place], [place] [address]    Special Requests   Final    NONE Performed at [facility], [place], [place] [address]    Gram Stain NO WBC SEEN NO ORGANISMS SEEN   Final   Culture   Final    FEW ESCHERICHIA COLI FEW CITROBACTER SPECIES FEW PROTEUS MIRABILIS FEW BACTEROIDES OVATUS BETA LACTAMASE POSITIVE Performed at [facility], [place] [address], [place], [place] [address]    Report Status 12/15/2019 FINAL  Final   Organism ID, Bacteria ESCHERICHIA COLI  Final   Organism ID, Bacteria CITROBACTER SPECIES  Final   Organism ID, Bacteria PROTEUS MIRABILIS  Final      Susceptibility   Citrobacter species -  MIC*    CEFAZOLIN >=64 RESISTANT Resistant     CEFEPIME <=1 SENSITIVE Sensitive     CEFTAZIDIME >=64 RESISTANT Resistant     CEFTRIAXONE >=64 RESISTANT Resistant     CIPROFLOXACIN <=0.25 SENSITIVE Sensitive     GENTAMICIN <=1 SENSITIVE Sensitive     IMIPENEM <=0.25 SENSITIVE Sensitive     TRIMETH/SULFA <=20 SENSITIVE Sensitive     PIP/TAZO 64 INTERMEDIATE Intermediate     * FEW CITROBACTER SPECIES   Escherichia coli - MIC*    AMPICILLIN 16 INTERMEDIATE Intermediate     CEFAZOLIN >=64 RESISTANT Resistant     CEFEPIME <=1 SENSITIVE Sensitive     CEFTAZIDIME <=1 SENSITIVE Sensitive     CEFTRIAXONE <=1 SENSITIVE Sensitive     CIPROFLOXACIN <=0.25 SENSITIVE Sensitive     GENTAMICIN <=1 SENSITIVE Sensitive      IMIPENEM <=0.25 SENSITIVE Sensitive     TRIMETH/SULFA <=20 SENSITIVE Sensitive     AMPICILLIN/SULBACTAM 4 SENSITIVE Sensitive     PIP/TAZO <=4 SENSITIVE Sensitive     * FEW ESCHERICHIA COLI   Proteus mirabilis - MIC*    AMPICILLIN <=2 SENSITIVE Sensitive     CEFAZOLIN <=4 SENSITIVE Sensitive     CEFEPIME <=1 SENSITIVE Sensitive     CEFTAZIDIME <=1 SENSITIVE Sensitive     CEFTRIAXONE <=1 SENSITIVE Sensitive     CIPROFLOXACIN <=0.25 SENSITIVE Sensitive     GENTAMICIN 2 SENSITIVE Sensitive     IMIPENEM 4 SENSITIVE Sensitive     TRIMETH/SULFA <=20 SENSITIVE Sensitive     AMPICILLIN/SULBACTAM <=2 SENSITIVE Sensitive     PIP/TAZO <=4 SENSITIVE Sensitive     * FEW PROTEUS MIRABILIS  Aerobic/Anaerobic Culture (surgical/deep wound)     Status: None   Collection Time: 12/12/19  2:06 AM   Specimen: Back; Wound  Result Value Ref Range Status   Specimen Description   Final    BACK LUMBAR SPINE Performed at [facility], [address] [place], [place] [address]    Special Requests   Final    NONE Performed at [facility], [place], [place] [address]    Gram Stain NO WBC SEEN NO ORGANISMS SEEN   Final   Culture   Final    FEW PROTEUS MIRABILIS NO ANAEROBES ISOLATED Performed at [facility], [place] [address], [place], [place] [address]    Report Status 12/17/2019 FINAL  Final   Organism ID, Bacteria PROTEUS MIRABILIS  Final      Susceptibility   Proteus mirabilis - MIC*    AMPICILLIN <=2 SENSITIVE Sensitive     CEFAZOLIN <=4 SENSITIVE Sensitive     CEFEPIME <=1 SENSITIVE Sensitive     CEFTAZIDIME <=1 SENSITIVE Sensitive     CEFTRIAXONE <=1 SENSITIVE Sensitive     CIPROFLOXACIN <=0.25 SENSITIVE Sensitive     GENTAMICIN 2 SENSITIVE Sensitive     IMIPENEM 4 SENSITIVE Sensitive     TRIMETH/SULFA <=20 SENSITIVE Sensitive     AMPICILLIN/SULBACTAM <=2 SENSITIVE Sensitive     PIP/TAZO <=4 SENSITIVE Sensitive     * FEW PROTEUS MIRABILIS      Studies: No results found.  Scheduled Meds: . chlorhexidine  15 mL Mouth Rinse BID  . Chlorhexidine Gluconate Cloth  6 each Topical Q0600  . epoetin (EPOGEN/PROCRIT) injection  10,000 Units Intravenous Q T,Th,Sa-HD  . feeding supplement (PRO-STAT SUGAR FREE 64)  30 mL Per Tube BID  . levothyroxine  50 mcg Intravenous Daily  . mouth rinse  15 mL Mouth  Rinse q12n4p  . pantoprazole (PROTONIX) IV  40 mg Intravenous Q24H  . sodium chloride flush  10-40 mL Intracatheter Q12H   Continuous Infusions: . sodium chloride Stopped (12/12/19 2140)  . albumin human Stopped (12/15/19 1209)  . ceFEPime (MAXIPIME) IV Stopped (12/18/19 2000)  . feeding supplement (NEPRO CARB STEADY) Stopped (12/18/19 1600)  . metronidazole 500 mg (12/19/19 1113)    Assessment/Plan:  1. Septic shock present on admission.  Blood cultures positive for Proteus mirabilis and Citrobacter.  Patient on Maxipime and Flagyl.  Source is the stage IV decubitus (present on admission) and abscess.  Case discussed with infectious disease and interventional radiology and they will attempt to do a drainage of abscess today. 2. Acute metabolic encephalopathy.  Likely from sepsis.  Underlying dementia.  MRI of the brain negative for stroke. 3. Failure to thrive.  NG tube placed for tube feeding but on hold for procedure today will restart tube feeds this evening. 4. End-stage renal disease on hemodialysis Tuesday, Thursday, Saturday.   5. Anemia of chronic disease.  Continue to monitor hemoglobin. 6. Hypothyroidism unspecified looks like she takes Synthroid at home 100 mcg I will start Synthroid 50 mcg IV daily.  TSH slightly high.   Pressure Injury 11/19/19 Sacrum Mid;Right Unstageable - Full thickness tissue loss in which the base of the injury is covered by slough (yellow, tan, gray, green or brown) and/or eschar (tan, brown or black) in the wound bed. deep wound with bed covered  (Active)  11/19/19 1800  Location: Sacrum   Location Orientation: Mid;Right  Staging: Unstageable - Full thickness tissue loss in which the base of the injury is covered by slough (yellow, tan, gray, green or brown) and/or eschar (tan, brown or black) in the wound bed.  Wound Description (Comments): deep wound with bed covered by slough  Present on Admission: Yes     Pressure Injury 11/19/19 Heel Left Deep Tissue Pressure Injury - Purple or maroon localized area of discolored intact skin or blood-filled blister due to damage of underlying soft tissue from pressure and/or shear. (Active)  11/19/19 1800  Location: Heel  Location Orientation: Left  Staging: Deep Tissue Pressure Injury - Purple or maroon localized area of discolored intact skin or blood-filled blister due to damage of underlying soft tissue from pressure and/or shear.  Wound Description (Comments):   Present on Admission: Yes       Code Status:     Code Status Orders  (From admission, onward)         Start     Ordered   12/11/19 2044  Full code  Continuous     12/11/19 2048        Code Status History    Date Active Date Inactive Code Status Order ID Comments User Context   11/19/2019 1527 11/23/2019 2115 Full Code [id number]  Collier Bullock, MD ED   11/03/2019 1234 11/09/2019 2034 Full Code [id number]  Collier Bullock, MD ED   05/31/2019 0016 07/08/2019 1758 Full Code [id number]  Rise Patience, MD ED   06/05/2018 1409 06/12/2018 1255 Full Code [id number]  Thurnell Lose, MD Inpatient   06/05/2018 1358 06/05/2018 1409 Full Code [id number]  Thurnell Lose, MD Inpatient   06/04/2018 0559 06/05/2018 1326 Full Code [id number]  Harrie Foreman, MD Inpatient   03/29/2017 2104 04/07/2017 2125 Full Code [id number]  Everrett Coombe, MD Inpatient   02/24/2017 1450 03/03/2017 1532 Full Code [id number]  Traci Sermon, PA-C Inpatient   02/24/2017 1450 02/24/2017  Marcellus Full Code [id number]  Elpidio Eric Inpatient   01/15/2017 1145 01/16/2017 2314 Full Code [id number]   Samella Parr, NP Inpatient   07/06/2016 1908 07/10/2016 1736 Full Code [id number]  Nicholes Mango, MD Inpatient   Advance Care Planning Activity     Family Communication: Spoke with son [name] on the phone Disposition Plan: Status is: Inpatient  Dispo: The patient is from: Acute rehab              Anticipated d/c is to: Potentially back to rehab              Anticipated d/c date is: Unknown at this point              Patient currently not stable enough for disposition at this point time.  Patient to have an interventional radiology procedure to drain an abscess.  Since her mental status and still impaired will have to have some way to feed the patient prior to any disposition.  Consultants:  Nephrology  Infectious disease  Palliative care  Antibiotics:  Cefepime  Flagyl  Time spent: 27 minutes  [place]

## 2019-12-19 NOTE — Care Management Important Message (Signed)
Important Message  Patient Details  Name: Taylor Tyler MRN: [id number] Date of Birth: 1940/09/22   Medicare Important Message Given:  Yes     Dannette Barbara 12/19/2019, 2:48 PM

## 2019-12-19 NOTE — Consult Note (Signed)
Chief Complaint: Patient was seen in consultation today for image guided aspiration/possible drain placement  Referring Physician(s): Dr. Leslye Peer  Supervising Physician: Markus Daft  Patient Status: [place] - In-pt  History of Present Illness: Taylor Tyler is a 79 y.o. female with a past medical history significant for dementia, DJD, COVID-19 (10/2019), fatty liver disease, HTN, HLD, anemia, DM, ESRD on HD, lumbar spinal stenosis s/p L3-4 and L4-5 decompressive laminectomy with Coflex posterior instrumentation (02/2013) with subsequent removal of Coflex devices, repeat bilateral L3-4, 4-5 laminectomies, L4-5 Gill procedure and L5-S1 initial laminectomy with facetectomy, posterior segmental instrumented fusion L3-S1 (02/2017) followed by removal of hardware on 06/23/2019 and chronic nonhealing sacral decubitus ulcer who presented to the ED via EMS from SNF on 12/11/19 due to fever and mental status change. In the ED she was found to be minimally responsive and hypoxic - she was admitted to the ICU and started on levophed gtt, vancomycin, cefepime and flagyl. Imaging showed no acute intracranial abnormalities but did note a 6.2 cm x 4.5 cm area of air and fluid posterior to the spinal canal and the level of the L4 vertebral body possibly representing abscess as well as changes suggestive of sacral osteomyelitis. Blood cultures were positive for proteus mirabilis and citrobacter. She underwent bedside debridement by general surgery on 5/10 and ID was consulted for further management. IR has been consulted for aspiration/possible drain placement within the fluid collection posterior to the lumbar spine as seen on imaging.  Patient seen in her room, alert but does not respond to any questions, no family members present during exam. Patient is unable to provide any history today. I spoke with Taylor Tyler's son Taylor Tyler via phone today who states that this is something that has occurred before and she spent 45 days in  the hospital before her mental status improved. He states that she has been "slowly losing her mind" and they believe it is all due to the wound on her buttocks. Taylor Tyler is hopeful that if the wound can get under control and her infection resolved her mental state will improve greatly. Discussed aspiration and possible drain placement today and he is agreeable to proceed.  Past Medical History:  Diagnosis Date   Acute blood loss anemia 03/14/2017   Acute renal failure superimposed on stage 3 chronic kidney disease ([place]) 03/14/2017   AKI (acute kidney injury) ([place]) 02/28/2017   Altered mental status 03/29/2017   Anemia    Aortic atherosclerosis ([facility])    Arthritis    "joints might ache at times; not that bad" (06/10/2018)   Bilateral lower extremity edema 03/04/2017   Bradycardia    Chronic kidney disease    ?? renal insufficiency,    CKD (chronic kidney disease) stage 3, GFR 30-59 ml/min 01/15/2017   ?? renal insufficiency, which she thinks is coming from "all these medications"   COVID-19    10/17/19   Dementia ([place])    Diabetes mellitus without complication ([place])    diagnosed 4-5 yrs ago, 06/21/18- "that was years ago"   Disease of pancreas    Diverticulitis    s/p perforation and partial colectomy 01/27/14 with 3 benign lymph nodes    Diverticulosis 01/15/2017   DJD (degenerative joint disease)    DM (diabetes mellitus), type 2 with renal complications ([place]) 09/23/2540   Elevated ferritin level    Fatty liver    Hypertension    Hypothyroidism    "had radiation" (06/10/2018)   Kidney disease, chronic, stage V (GFR under  15 ml/min) ([place])    per notes from nursing home-she is on dialysis   Kidney stone    Lethargy 02/28/2017   Obesity, Class III, BMI 40-49.9 (morbid obesity) ([place]) 02/27/2017   Pleural lipoma    Postoperative wound infection 04/02/2017   Spinal stenosis of lumbar region 01/15/2017   Status post lumbar surgery    Wound healing, delayed    back      Past Surgical History:  Procedure Laterality Date   ABDOMINAL EXPOSURE N/A 02/24/2017   Procedure: ABDOMINAL EXPOSURE;  Surgeon: Angelia Mould, MD;  Location: [facility];  Service: Vascular;  Laterality: N/A;   [place]   no h/o abnormal paps    ANTERIOR LAT LUMBAR FUSION N/A 02/24/2017   Procedure: Lumbar three- five Anterior lateral lumbar interbody fusion;  Surgeon: Ditty, Kevan Ny, MD;  Location: [place] [facility];  Service: Neurosurgery;  Laterality: N/A;  L3-5 Anterior lateral lumbar interbody fusion with removal of coflex at L3-4, L4-5   ANTERIOR LUMBAR FUSION N/A 02/24/2017   Procedure: Stage 1: Lumbar five-Sacral one Anterior lumbar interbody fusion;  Surgeon: Ditty, Kevan Ny, MD;  Location: [facility];  Service: Neurosurgery;  Laterality: N/A;  Stage 1: L5-S1 Anterior lumbar interbody fusion   APPENDECTOMY     APPLICATION OF ROBOTIC ASSISTANCE FOR SPINAL PROCEDURE N/A 02/26/2017   Procedure: APPLICATION OF ROBOTIC ASSISTANCE FOR SPINAL PROCEDURE;  Surgeon: Ditty, Kevan Ny, MD;  Location: [place];  Service: Neurosurgery;  Laterality: N/A;   APPLICATION OF WOUND VAC N/A 03/30/2017   Procedure: APPLICATION OF WOUND VAC;  Surgeon: Ditty, Kevan Ny, MD;  Location: [facility];  Service: Neurosurgery;  Laterality: N/A;   APPLICATION OF WOUND VAC N/A 06/23/2019   Procedure: APPLICATION OF WOUND VAC;  Surgeon: Consuella Lose, MD;  Location: [place];  Service: Neurosurgery;  Laterality: N/A;   AV FISTULA PLACEMENT Left 06/08/2019   Procedure: Arteriovenous (Av) Fistula Creation;  Surgeon: Rosetta Posner, MD;  Location: [facility];  Service: Vascular;  Laterality: Left;   BACK SURGERY  2013   CATARACT EXTRACTION W/ INTRAOCULAR LENS IMPLANT Right    CHOLECYSTECTOMY OPEN  1982   COLON SURGERY  01/26/2014   desc.sigmoid colectomy and ventral hernia repair and splenic flexure mobilization   COLON SURGERY     DILATION AND CURETTAGE OF UTERUS     HERNIA REPAIR      INSERTION OF DIALYSIS CATHETER Right 06/08/2019   Procedure: INSERTION OF DIALYSIS CATHETER RIGHT INTERNAL JUGULAR;  Surgeon: Rosetta Posner, MD;  [facility];  Service: Vascular;  Laterality: Right;   LUMBAR LAMINECTOMY WITH SPINOUS PROCESS PLATE 2 LEVEL N/A 08/12/3233   Procedure: LUMBAR LAMINECTOMY/DECOMPRESSION MICRODISCECTOMY CoFlex;  Surgeon: Faythe Ghee, MD;  Location: [facility];  Service: Neurosurgery;  Laterality: N/A;  Lumbar three-four,Lumbar Four-Five Laminectomy with Coflex   LUMBAR WOUND DEBRIDEMENT N/A 03/30/2017   Procedure: Lumbar wound exploration/debridement, placement of wound vac;  Surgeon: Ditty, Kevan Ny, MD;  Location: [place];  Service: Neurosurgery;  Laterality: N/A;  Lumbar wound exploration/debridement, placement of wound vac   LUMBAR WOUND DEBRIDEMENT N/A 06/06/2018   Procedure: LUMBAR WOUND DEBRIDEMENT/EXPLORATION;  Surgeon: Consuella Lose, MD;  Location: [place];  Service: Neurosurgery;  Laterality: N/A;   LUMBAR WOUND DEBRIDEMENT N/A 06/22/2018   Procedure: SIMPLE INCISION AND DRAINAGE OF WOUND, APPLICATION OF WOUND VAC;  Surgeon: Consuella Lose, MD;  Location: [place];  Service: Neurosurgery;  Laterality: N/A;   LUMBAR WOUND DEBRIDEMENT N/A 06/23/2019   Procedure: LUMBAR  WOUND DEBRIDEMENT WITH HARDWARE REMOVAL;  Surgeon: Consuella Lose, MD;  Location: [place];  Service: Neurosurgery;  Laterality: N/A;   PARTIAL COLECTOMY     01/27/14 diverticulitis and 3 benign lymph nodes [facility] Dr. Pat Patrick    REDUCTION MAMMAPLASTY Bilateral [place] ARTERY Left 06/08/2019   Procedure: Thrombectomy Brachial Artery;  Surgeon: Rosetta Posner, MD;  Location: [facility];  Service: Vascular;  Laterality: Left;   TUBAL LIGATION     VENTRAL HERNIA REPAIR  2014    Allergies: Clams [shellfish allergy], Hydralazine, Norvasc [amlodipine besylate], and Milk-related compounds  Medications: Prior to Admission medications   Medication Sig Start Date End  Date Taking? Authorizing Provider  acetaminophen (TYLENOL) 500 MG tablet Take 500 mg by mouth daily as needed for mild pain.    Yes [provider]  atorvastatin (LIPITOR) 40 MG tablet Take 1 tablet (40 mg total) by mouth daily at 6 PM. 09/03/18  Yes McLean-Scocuzza, Nino Glow, MD  benzonatate (TESSALON PERLES) 100 MG capsule Take 1 capsule (100 mg total) by mouth 3 (three) times daily as needed for cough. 08/07/19 08/06/20 Yes Earleen Newport, MD  citalopram (CELEXA) 10 MG tablet Take 10 mg by mouth daily.   Yes [provider]  collagenase (SANTYL) ointment Apply topically daily. 11/24/19  Yes Nicole Kindred A, DO  divalproex (DEPAKOTE SPRINKLE) 125 MG capsule Take 125 mg by mouth 2 (two) times daily.    Yes [provider]  docusate sodium (COLACE) 100 MG capsule Take 100 mg by mouth 2 (two) times daily.   Yes [provider]  donepezil (ARICEPT) 5 MG tablet Take 5 mg by mouth at bedtime.   Yes [provider]  epoetin alfa (EPOGEN) 4000 UNIT/ML injection Inject 1 mL (4,000 Units total) into the skin Every Tuesday,Thursday,and Saturday with dialysis. 11/09/19  Yes Enzo Bi, MD  gabapentin (NEURONTIN) 100 MG capsule Take 2 capsules (200 mg total) by mouth at bedtime. 07/04/19  Yes Regalado, Belkys A, MD  labetalol (NORMODYNE) 100 MG tablet Take 1 tablet (100 mg total) by mouth 2 (two) times daily. 07/08/19  Yes Irene Pap N, DO  Lactobacillus (ACIDOPHILUS PO) Take 175 mg by mouth 2 (two) times daily.   Yes [provider]  lactulose, encephalopathy, (GENERLAC) 10 GM/15ML SOLN Take 10 g by mouth daily.   Yes [provider]  levothyroxine (SYNTHROID, LEVOTHROID) 100 MCG tablet Take 100 mcg by mouth daily before breakfast.   Yes [provider]  meclizine (ANTIVERT) 12.5 MG tablet Take 1 tablet (12.5 mg total) by mouth daily as needed for dizziness. 11/09/19  Yes Enzo Bi, MD  memantine (NAMENDA) 5 MG tablet Take 5 mg by mouth 2  (two) times daily.   Yes [provider]  Nutritional Supplements (FEEDING SUPPLEMENT, NEPRO CARB STEADY,) LIQD Take 237 mLs by mouth daily.   Yes [provider]  risperiDONE (RISPERDAL) 0.25 MG tablet Take 0.25 mg by mouth 2 (two) times daily.   Yes [provider]  sevelamer carbonate (RENVELA) 800 MG tablet Take 1 tablet (800 mg total) by mouth 3 (three) times daily with meals. 07/04/19  Yes Regalado, Belkys A, MD  traMADol (ULTRAM) 50 MG tablet Take 25 mg by mouth every 6 (six) hours as needed for moderate pain.    Yes [provider]  zinc oxide 20 % ointment Apply 1 application topically in the morning, at noon, and at bedtime.   Yes [provider]  folic acid (FOLVITE)  400 MCG tablet Take 400 mcg by mouth daily.     [provider]     Family History  Problem Relation Age of Onset   Diabetes Father    Heart attack Father    Hypertension Father    Heart disease Father    Kidney disease Father    Diabetes Mother    Hypertension Mother    Heart disease Mother    Kidney disease Mother    Lupus Sister    Heart disease Brother    Diabetes Brother    Heart disease Brother    Diabetes Brother    Lupus Sister     Social History   Socioeconomic History   Marital status: Widowed    Spouse name: Not on file   Number of children: Not on file   Years of education: Not on file   Highest education level: Not on file  Occupational History   Occupation: retired Economist work  Tobacco Use   Smoking status: Never Smoker   Smokeless tobacco: Never Used  Substance and Sexual Activity   Alcohol use: Not Currently    Comment: "nothing since age 65" (06/10/2018)   Drug use: Never   Sexual activity: Not Currently  Other Topics Concern   Not on file  Social History Narrative   Admitted to [facility] 03/03/17   Widowed since 2012    2 sons Taylor Tyler comes to appts)    Never smoked   Alcohol none     Full Code   12th grade education retired    Occupational hygienist    Social Determinants of Radio broadcast assistant Strain:    Difficulty of Paying Living Expenses:   Food Insecurity:    Worried About Charity fundraiser in the Last Year:    Arboriculturist in the Last Year:   Transportation Needs:    Film/video editor (Medical):    Lack of Transportation (Non-Medical):   Physical Activity:    Days of Exercise per Week:    Minutes of Exercise per Session:   Stress:    Feeling of Stress :   Social Connections:    Frequency of Communication with Friends and Family:    Frequency of Social Gatherings with Friends and Family:    Attends Religious Services:    Active Member of Clubs or Organizations:    Attends Archivist Meetings:    Marital Status:      Review of Systems: A 12 point ROS discussed and pertinent positives are indicated in the HPI above.  All other systems are negative.  Review of Systems  Unable to perform ROS: Mental status change    Vital Signs: BP (!) 144/51 (BP Location: Left Arm)    Pulse 91    Temp (!) 97.3 F (36.3 C) (Axillary)    Resp 16    Ht 5\' 6"  (1.676 m)    Wt 191 lb 2.2 oz (86.7 kg)    SpO2 98%    BMI 30.85 kg/m   Physical Exam Vitals and nursing note reviewed.  Constitutional:      General: She is not in acute distress.    Appearance: She is obese. She is ill-appearing.  Cardiovascular:     Rate and Rhythm: Normal rate and regular rhythm.  Pulmonary:     Effort: Pulmonary effort is normal.     Breath sounds: Normal breath sounds.  Abdominal:     Palpations: Abdomen is  soft.  Skin:    General: Skin is warm and dry.  Neurological:     Mental Status: She is alert. She is disoriented.      MD Evaluation Airway: WNL Heart: WNL Abdomen: WNL Chest/ Lungs: WNL ASA  Classification: 3 Mallampati/Airway Score: (Unable to assess - patient does not follow commands)   Imaging: CT ABDOMEN PELVIS WO  CONTRAST  Result Date: 12/11/2019 CLINICAL DATA:  Unresponsive. EXAM: CT ABDOMEN AND PELVIS WITHOUT CONTRAST TECHNIQUE: Multidetector CT imaging of the abdomen and pelvis was performed following the standard protocol without IV contrast. COMPARISON:  March 29, 2017 FINDINGS: Lower chest: No acute abnormality. Hepatobiliary: No focal liver abnormality is seen. No gallstones, gallbladder wall thickening, or biliary dilatation. Pancreas: Unremarkable. No pancreatic ductal dilatation or surrounding inflammatory changes. Spleen: Normal in size without focal abnormality. Adrenals/Urinary Tract: Adrenal glands are unremarkable. Kidneys are normal in size, without focal lesions or hydronephrosis. A 6 mm nonobstructing renal stone is seen within the lower pole of the left kidney. A Foley catheter is seen within the urinary bladder. Stomach/Bowel: There is a small hiatal hernia. The appendix is not identified. Surgically anastomosed bowel is seen within the mid sigmoid colon. Noninflamed diverticula are also seen within the proximal sigmoid colon. No evidence of bowel dilatation. Vascular/Lymphatic: Moderate to marked severity aortic calcification. No enlarged abdominal or pelvic lymph nodes. Reproductive: Status post hysterectomy. No adnexal masses. Other: There is a predominant stable 11.9 cm x 3.7 cm ventral hernia seen just above the level of the umbilicus. This contains fat and a segment of nondilated transverse colon. An additional 13.4 cm x 6.2 cm ventral hernia is seen along the anterior aspect of the lower pelvic wall. This is mildly increased in size when compared to the prior study and contains fat and multiple nondilated loops of distal small bowel. Musculoskeletal: Multilevel degenerative changes seen throughout the lumbar spine with a metallic density fusion plate and screws seen along the anterior aspect of the levels of L5 and S1. This represents a new finding when compared to the prior exam. The multiple  bilateral pedicle screws seen throughout the lumbar spine on the prior study have been removed. Additional postoperative changes are seen involving the posterior elements of the lumbar spine. A 6.2 cm x 4.5 cm area of air and fluid attenuation is seen posterior to the spinal canal at the level of the L4 vertebral body. Tiny foci of air are seen within the prevertebral soft tissues at the level of L4-L5, and within the adjacent portion of the lower left hemipelvis. These extend to the region adjacent to the mid sigmoid colon. A 3.2 cm x 3.1 cm x 2.0 cm soft tissue defect is seen along the posterior aspect of the lumbar spine at the level of L3 and L4. A 3.4 cm by 0.5 cm sacral decubitus ulcer is seen along the mid to lower sacrum, to the left of midline. A small amount of adjacent cortical destruction is seen. IMPRESSION: 1. Extensive postoperative changes within the lumbar spine, as described above, with a 6.2 cm x 4.5 cm area of air and fluid attenuation seen posterior to the spinal canal at the level of the L4 vertebral body. This may represent a postoperative collection/abscess. 2. Large, ventral hernias, as described above. 3. Sacral decubitus ulcer with additional findings suggestive of acute osteomyelitis. 4. Stable 6 mm nonobstructing renal stone within the left kidney. 5. Sigmoid diverticulosis. Aortic Atherosclerosis (ICD10-I70.0). Electronically Signed   By: Joyce Gross.D.  On: 12/11/2019 22:02   CT HEAD WO CONTRAST  Result Date: 12/11/2019 CLINICAL DATA:  Unresponsive. EXAM: CT HEAD WITHOUT CONTRAST TECHNIQUE: Contiguous axial images were obtained from the base of the skull through the vertex without intravenous contrast. COMPARISON:  November 19, 2019 FINDINGS: Brain: There is mild to moderate severity cerebral atrophy with widening of the extra-axial spaces and ventricular dilatation. There are areas of decreased attenuation within the white matter tracts of the supratentorial brain, consistent  with microvascular disease changes. Vascular: No hyperdense vessel or unexpected calcification. Skull: Normal. Negative for fracture or focal lesion. Sinuses/Orbits: There is mild sphenoid sinus mucosal thickening. Other: None. IMPRESSION: 1. No acute intracranial abnormality. 2. Mild to moderate severity cerebral atrophy and microvascular disease changes of the supratentorial brain. 3. Mild sphenoid sinus disease. Electronically Signed   By: Virgina Norfolk M.D.   On: 12/11/2019 21:32   CT Head Wo Contrast  Result Date: 11/19/2019 CLINICAL DATA:  Altered mental status. EXAM: CT HEAD WITHOUT CONTRAST TECHNIQUE: Contiguous axial images were obtained from the base of the skull through the vertex without intravenous contrast. COMPARISON:  03/29/2017 and brain MR dated 09/08/2019 FINDINGS: Brain: Stable mildly enlarged ventricles and cortical sulci. Stable mild patchy white matter low density in both cerebral hemispheres. No intracranial hemorrhage, mass lesion or CT evidence of acute infarction. Vascular: No hyperdense vessel or unexpected calcification. Skull: Normal. Negative for fracture or focal lesion. Sinuses/Orbits: Mild increase in posteriorly layering fluid/mucosal thickening in the sphenoid sinus on the left. Interval small amount of fluid in the left maxillary sinus. Status post cataract extraction on the right. Unremarkable left orbit. Other: None. IMPRESSION: 1. No acute abnormality. 2. Stable mild diffuse cerebral and cerebellar atrophy and mild chronic small vessel white matter ischemic changes in both cerebral hemispheres. 3. Mild increase in the amount of fluid/mucosal thickening in the sphenoid sinus on the left. 4. Interval small amount of fluid in the left maxillary sinus, possibly due to acute sinusitis. Electronically Signed   By: Claudie Revering M.D.   On: 11/19/2019 12:17   MR BRAIN WO CONTRAST  Result Date: 12/16/2019 CLINICAL DATA:  Encephalopathy. Stroke suspected. EXAM: MRI HEAD  WITHOUT CONTRAST TECHNIQUE: Multiplanar, multiecho pulse sequences of the brain and surrounding structures were obtained without intravenous contrast. COMPARISON:  Head CT 12/11/2019 and MRI 09/08/2019 FINDINGS: Brain: No acute infarct, mass, midline shift, or extra-axial fluid collection is identified. Chronic microhemorrhages in the left parietal and right occipital lobes are unchanged. T2 hyperintensities in the cerebral white matter bilaterally are unchanged and nonspecific but compatible with mild chronic small vessel ischemic disease. Mild-to-moderate cerebral atrophy is again noted. Vascular: Major intracranial vascular flow voids are preserved. Skull and upper cervical spine: Unremarkable bone marrow signal. Sinuses/Orbits: Right cataract extraction. Minimal fluid in the sphenoid sinuses. Clear mastoid air cells. Other: None. IMPRESSION: 1. No acute intracranial abnormality. 2. Mild chronic small vessel ischemic disease and mild-to-moderate cerebral atrophy. Electronically Signed   By: Logan Bores M.D.   On: 12/16/2019 14:49   DG Chest Port 1 View  Result Date: 12/12/2019 CLINICAL DATA:  Central line placement EXAM: PORTABLE CHEST 1 VIEW COMPARISON:  12/11/2019 FINDINGS: Single frontal view of the chest demonstrates left internal jugular catheter tip overlying superior vena cava. Stable right internal jugular dialysis catheter. The cardiac silhouette is enlarged. There is central vascular congestion, with interval development of patchy bilateral perihilar ground-glass airspace disease. No effusion or pneumothorax. No acute bony abnormalities. IMPRESSION: 1. No complication after left internal jugular catheter  placement. 2. Worsening volume status, with development of mild pulmonary edema. Electronically Signed   By: Randa Ngo M.D.   On: 12/12/2019 02:03   DG Chest Port 1 View  Result Date: 12/11/2019 CLINICAL DATA:  79 year old female with sepsis. EXAM: PORTABLE CHEST 1 VIEW COMPARISON:  Chest  radiograph dated 11/19/2019. FINDINGS: Dialysis catheter in similar position. There is stable cardiomegaly. Probable mild vascular congestion. No edema. No focal consolidation, pleural effusion, pneumothorax. Atherosclerotic calcification of the aorta. No acute osseous pathology. Degenerative changes of the shoulders. IMPRESSION: No focal consolidation.  Probable mild vascular congestion. Electronically Signed   By: Anner Crete M.D.   On: 12/11/2019 20:33   DG Chest Port 1 View  Result Date: 11/19/2019 CLINICAL DATA:  Altered mental status, bradycardia EXAM: PORTABLE CHEST 1 VIEW COMPARISON:  11/03/2019 chest radiograph. FINDINGS: Right internal jugular central venous catheter terminates over high right atrium. Stable cardiomediastinal silhouette with mild cardiomegaly. No pneumothorax. No pleural effusion. No overt pulmonary edema. No acute consolidative airspace disease. IMPRESSION: Stable mild cardiomegaly without overt pulmonary edema. No active pulmonary disease. Electronically Signed   By: Ilona Sorrel M.D.   On: 11/19/2019 12:37   DG Abd Portable 1V  Result Date: 12/17/2019 CLINICAL DATA:  NG tube placement. EXAM: PORTABLE ABDOMEN - 1 VIEW COMPARISON:  CT, 12/11/2019. FINDINGS: Nasogastric tube passes below the diaphragm, tip projects in the mid stomach. No bowel dilation. IMPRESSION: Nasal/orogastric tube tip projects in the mid stomach. Electronically Signed   By: Lajean Manes M.D.   On: 12/17/2019 10:37    Labs:  CBC: Recent Labs    12/16/19 0343 12/17/19 0447 12/17/19 1131 12/18/19 0709  WBC 10.5 9.7 13.2* 11.7*  HGB 7.1* 7.2* 7.4* 7.3*  HCT 22.5* 23.9* 24.7* 24.8*  PLT 77* 112* 133* 162    COAGS: Recent Labs    06/08/19 0327 12/11/19 1945  INR 1.3* 1.6*  APTT  --  29    BMP: Recent Labs    12/15/19 0414 12/15/19 0414 12/16/19 0343 12/16/19 0900 12/17/19 1131 12/18/19 0709  NA 131*  --  136  --  134* 139  K 3.4*   < > 2.7* 3.0* 3.6 3.0*  CL 99  --  101   --  103 105  CO2 24  --  28  --  24 27  GLUCOSE 106*  --  105*  --  117* 177*  BUN 16  --  8  --  10 6*  CALCIUM 7.2*  --  7.3*  --  7.6* 7.5*  CREATININE 2.17*  --  1.44*  --  2.07* 1.38*  GFRNONAA 21*  --  35*  --  23* 37*  GFRAA 25*  --  41*  --  26* 43*   < > = values in this interval not displayed.    LIVER FUNCTION TESTS: Recent Labs    08/07/19 2245 08/07/19 2245 11/03/19 0846 11/03/19 0846 11/08/19 0849 11/19/19 1221 12/11/19 1945 12/17/19 1131  BILITOT 0.7  --  0.7  --   --  0.5 0.4  --   AST 29  --  30  --   --  15 30  --   ALT <5  --  11  --   --  10 23  --   ALKPHOS 59  --  79  --   --  87 90  --   PROT 6.3*  --  6.4*  --   --  5.8* 5.1*  --  ALBUMIN 2.3*   < > 2.0*   < > 1.6* 1.5* 1.5* 1.7*   < > = values in this interval not displayed.    TUMOR MARKERS: No results for input(s): AFPTM, CEA, CA199, CHROMGRNA in the last 8760 hours.  Assessment and Plan:  79 y/o F with history as per HPI with chronic nonhealing decubitus admitted for septic shock with blood cultures positive for proteus mirabilis and citrobacter - she is followed by ID and currently receiving cefepime + flagyl. CT A/P w/o contrast on 5/9 showed a 6.2 cm x 4.5 cm air and fluid collection posterior to the lumbar spine - IR has been asked to aspirate/possibly place a drain within this collection. Patient has been reviewed by Dr. Anselm Pancoast who approves procedure.  Discussed procedure risks, benefits and alternatives with Taylor Tyler's son Taylor Tyler today via phone who states understanding and is agreeable to proceed.  Risks and benefits discussed with the patient's son Taylor Tyler including bleeding, infection, damage to adjacent structures, bowel perforation/fistula connection, and sepsis.  All of the patient's son's questions were answered, patient's son is agreeable to proceed.  Verbal consent obtained via phone and placed in chart.  Thank you for this interesting consult.  I greatly enjoyed meeting Taylor Tyler and look forward to participating in their care.  A copy of this report was sent to the requesting provider on this date.  Electronically Signed: Joaquim Nam, PA-C 12/19/2019, 9:41 AM   I spent a total of 40 Minutes  in face to face in clinical consultation, greater than 50% of which was counseling/coordinating care for aspiration/drain placement.

## 2019-12-19 NOTE — Progress Notes (Signed)
Pharmacy Antibiotic Note  Taylor Tyler is a 79 y.o. female admitted on 12/11/2019 with sepsis. Patient with sacral wounds and concern for osteomyelitis as well as spinal abscess. Patient with h/o Proteus in lumbar culture as well as multiple organisms in wound cultures. Now presenting with Proteus and Citrobacter bacteremia. Pharmacy has been consulted for Cefepime dosing. Metronidazole for anaerobic coverage. ID and surgery consulted. No plan for surgical intervention at this time.  Patient with ESRD on HD TTS. Per nephrology, plan to continue with regular dialysis schedule.  Plan: Continue cefepime 1 g IV q24h to be given in the evening.  Height: 5\' 6"  (167.6 cm) Weight: 86.7 kg (191 lb 2.2 oz) IBW/kg (Calculated) : 59.3  Temp (24hrs), Avg:98.2 F (36.8 C), Min:97.3 F (36.3 C), Max:98.7 F (37.1 C)  Recent Labs  Lab 12/14/19 0600 12/14/19 0600 12/15/19 0414 12/16/19 0343 12/17/19 0447 12/17/19 1131 12/18/19 0709  WBC 8.8   < > 10.1 10.5 9.7 13.2* 11.7*  CREATININE 1.80*  --  2.17* 1.44*  --  2.07* 1.38*   < > = values in this interval not displayed.    Estimated Creatinine Clearance: 38.5 mL/min (A) (by C-G formula based on SCr of 1.38 mg/dL (H)).    Allergies  Allergen Reactions  . Clams [Shellfish Allergy] Swelling and Other (See Comments)    THROAT SWELLS NECK TURNS RED  . Hydralazine Other (See Comments)    CHEST TIGHTNESS Patient has tolerated multiple doses of hydralazine since allergy was listed   . Norvasc [Amlodipine Besylate] Swelling    Leg edema   . Milk-Related Compounds Diarrhea   Microbiology Results: 5/9 BCx x2: proteus mirabilis, citrobacter youngae 5/10 Wound Cx (sacral): E.coli, citrobacter, proteus mirabilis, bacteroides ovatus 5/10 Wound Cx (lumbar spine): proteus mirabilis  Thank you for allowing pharmacy to be a part of this patient's care.  Rayna Sexton, PharmD, BCPS Clinical Pharmacist 12/19/2019 9:11 AM

## 2019-12-19 NOTE — Plan of Care (Signed)
  Problem: Clinical Measurements: Goal: Ability to maintain clinical measurements within normal limits will improve Outcome: Not Progressing   Problem: Activity: Goal: Risk for activity intolerance will decrease Outcome: Not Progressing   Problem: Nutrition: Goal: Adequate nutrition will be maintained Outcome: Not Progressing   

## 2019-12-19 NOTE — Procedures (Signed)
Interventional Radiology Procedure:   Indications: Paraspinal fluid collection, suggestive for abscess  Procedure: CT guided aspiration  Findings: Aspirated 20 ml of brown fluid from posterior paraspinal collection.  Aspirated air as well.  Collection was partially decompressed after aspiration but no additional fluid could be obtained.   Complications: None     EBL: less than 10 ml  Plan: Send fluid for culture.     Aracelys Glade R. Anselm Pancoast, MD  Pager: [phone]

## 2019-12-19 NOTE — Progress Notes (Signed)
ID  Non verbal Opens eyes to calling her name but does not talk  Patient Vitals for the past 24 hrs:  BP Temp Temp src Pulse Resp SpO2 Weight  12/19/19 1551 (!) 154/59 98.6 F (37 C) -- 90 16 96 % --  12/19/19 1454 138/68 -- -- 97 16 98 % --  12/19/19 1448 138/66 -- -- 98 15 98 % --  12/19/19 1145 129/63 97.9 F (36.6 C) Axillary 88 16 100 % --  12/19/19 0733 (!) 144/51 (!) 97.3 F (36.3 C) Axillary 91 16 98 % --  12/19/19 0611 129/71 98.3 F (36.8 C) Oral (!) 102 18 99 % --  12/19/19 0500 -- -- -- -- -- -- 86.7 kg  12/19/19 0407 (!) 126/57 98.6 F (37 C) -- 96 16 100 % --  12/19/19 0402 (!) 131/58 98 F (36.7 C) Oral 97 -- 100 % --  12/19/19 0027 (!) 126/59 98.1 F (36.7 C) Axillary (!) 103 16 99 % --  12/18/19 2058 130/62 98.5 F (36.9 C) Oral (!) 103 16 99 % --  12/18/19 2000 (!) 149/55 -- -- 96 18 99 % --  12/18/19 1900 (!) 148/57 98.7 F (37.1 C) Oral 95 19 100 % --  12/18/19 1800 (!) 143/56 -- -- 93 18 99 % --  12/18/19 1700 (!) 150/61 -- -- 99 19 98 % --  12/18/19 1600 (!) 139/91 -- -- 96 17 98 % --   Chest b/l air entry Rt permacath Left arm Fistula Sacral wound        CBC Latest Ref Rng & Units 12/19/2019 12/18/2019 12/17/2019  WBC 4.0 - 10.5 K/uL 14.6(H) 11.7(H) 13.2(H)  Hemoglobin 12.0 - 15.0 g/dL 7.3(L) 7.3(L) 7.4(L)  Hematocrit 36.0 - 46.0 % 24.1(L) 24.8(L) 24.7(L)  Platelets 150 - 400 K/uL 170 162 133(L)    CMP Latest Ref Rng & Units 12/19/2019 12/18/2019 12/17/2019  Glucose 70 - 99 mg/dL 116(H) 177(H) 117(H)  BUN 8 - 23 mg/dL 14 6(L) 10  Creatinine 0.44 - 1.00 mg/dL 1.90(H) 1.38(H) 2.07(H)  Sodium 135 - 145 mmol/L 139 139 134(L)  Potassium 3.5 - 5.1 mmol/L 3.2(L) 3.0(L) 3.6  Chloride 98 - 111 mmol/L 106 105 103  CO2 22 - 32 mmol/L 26 27 24   Calcium 8.9 - 10.3 mg/dL 7.9(L) 7.5(L) 7.6(L)  Total Protein 6.5 - 8.1 g/dL - - -  Total Bilirubin 0.3 - 1.2 mg/dL - - -  Alkaline Phos 38 - 126 U/L - - -  AST 15 - 41 U/L - - -  ALT 0 - 44 U/L - - -     Microbiology  Blood culture from 12/11/2019 4 out of 4 bottles Proteus mirabilis and Citrobacter youngae Proteus is pansensitive Citrobacter is sensitive to cefepime, ciprofloxacin, gentamicin and imipenem and trimethoprim sulfamethoxazole    12/12/2019 Site of prior surgery at the lumbar spine L4 area Proteus mirabilis pansensitive.  12/12/2019 from the new sacral ulcer Multiple organisms including E. coli, Citrobacter, Bacteroides, Proteus.  5/17 aspirate culture   Impression/recommendation  Proteus bacteremiaand citrobacter bacteremia  Source from back wounds Patient has had a complicated infectious history and initially had hardware which was infected with Proteus the entire system was removed in November 2020. The recent CT abdomen shows 6.2 cm x 4.5 cm area of air and fluidcolectionseen posterior to the spinal canal at the level of the L4 vertebral body.The lumbar wound has proteus in the culture IR aspirated 20 cc of fluid from the paraspinal area Could not  leave a drain as the thecal sac is close by   Stage IV sacral decubitus- debrided The sacral ulcer has citrobacter , e.coli, proteus, anerobes ( bacteroides)  Continue cefepime and Flagyl   Encephalopathy   Sacral stage IV sacral decubitus new with underlying osteomyelitis. Surgeryhasdebrided the wound. citrobacter and e.coli so far On cefepime and Flagyl   Will need 4-6 weeks of IV cefepime, will give flagyl for a week more Discussed the management with the hospitalist and IR

## 2019-12-19 NOTE — Progress Notes (Signed)
Patient clinically stable post abscess aspiration drainage per Dr Anselm Pancoast, tolerated well, patient's baseline mental status unresponsive except to pain, same post procedure.no sedation given to patient, only monitored, vitals stable throughout. 22ml purulent drainage removed with procedure. Report called to Georga Hacking on 1A with questions answered.patient mental status unchanged post procedure.

## 2019-12-19 NOTE — Progress Notes (Signed)
[facility]  ROUNDING NOTE   Subjective:   Opening eyes spontaneously.   Objective:  Vital signs in last 24 hours:  Temp:  [97.3 F (36.3 C)-98.7 F (37.1 C)] 97.9 F (36.6 C) (05/17 1145) Pulse Rate:  [88-103] 88 (05/17 1145) Resp:  [11-19] 16 (05/17 1145) BP: (126-150)/(51-91) 129/63 (05/17 1145) SpO2:  [98 %-100 %] 100 % (05/17 1145) Weight:  [86.7 kg] 86.7 kg (05/17 0500)  Weight change:  Filed Weights   12/15/19 1700 12/16/19 0152 12/19/19 0500  Weight: 79 kg 79.9 kg 86.7 kg    Intake/Output: I/O last 3 completed shifts: In: 1210.9 [I.V.:188.9; NG/GT:432.1; IV Piggyback:589.9] Out: -    Intake/Output this shift:  No intake/output data recorded.  Physical Exam: General: ill-appearing  Head: +NGT  Eyes: Anicteric  Neck: Supple, trachea midline  Lungs:  Clear to auscultation, normal effort  Heart: regular  Abdomen:  +ventral hernia  Extremities: 1+ peripheral edema.  Neurologic: Lethargic, opens eyes to verbal stimuli. No tracking  Skin: Warm/dry, sacral wound not examined (pictures reviewed)  Access: Right IJ PermCath    Basic Metabolic Panel: Recent Labs  Lab 12/15/19 0414 12/15/19 0414 12/16/19 0343 12/16/19 0343 12/16/19 0900 12/17/19 0447 12/17/19 1131 12/18/19 0709 12/19/19 1036  NA 131*  --  136  --   --   --  134* 139 139  K 3.4*   < > 2.7*  --  3.0*  --  3.6 3.0* 3.2*  CL 99  --  101  --   --   --  103 105 106  CO2 24  --  28  --   --   --  24 27 26   GLUCOSE 106*  --  105*  --   --   --  117* 177* 116*  BUN 16  --  8  --   --   --  10 6* 14  CREATININE 2.17*  --  1.44*  --   --   --  2.07* 1.38* 1.90*  CALCIUM 7.2*   < > 7.3*   < >  --   --  7.6* 7.5* 7.9*  MG 1.9  --  1.6*  --   --  2.0  --  1.9 1.9  PHOS 3.5   < > 2.1*  --   --  3.4 3.7 1.9* 1.9*   < > = values in this interval not displayed.    Liver Function Tests: Recent Labs  Lab 12/17/19 1131  ALBUMIN 1.7*   No results for input(s): LIPASE, AMYLASE in the  last 168 hours. Recent Labs  Lab 12/18/19 0709  AMMONIA 61*    CBC: Recent Labs  Lab 12/15/19 0414 12/15/19 0414 12/16/19 0343 12/17/19 0447 12/17/19 1131 12/18/19 0709 12/19/19 1036  WBC 10.1   < > 10.5 9.7 13.2* 11.7* 14.6*  NEUTROABS 8.6*  --  7.9* 6.6  --  8.0* 10.4*  HGB 7.3*   < > 7.1* 7.2* 7.4* 7.3* 7.3*  HCT 24.4*   < > 22.5* 23.9* 24.7* 24.8* 24.1*  MCV 89.1   < > 84.6 88.5 89.2 91.2 88.3  PLT 73*   < > 77* 112* 133* 162 170   < > = values in this interval not displayed.    Cardiac Enzymes: No results for input(s): CKTOTAL, CKMB, CKMBINDEX, TROPONINI in the last 168 hours.  BNP: Invalid input(s): POCBNP  CBG: Recent Labs  Lab 12/18/19 2128 12/18/19 2358 12/19/19 0611 12/19/19 0730 12/19/19 1143  GLUCAP  169* 144* 105* 117* 47*    Microbiology: Results for orders placed or performed during the hospital encounter of 12/11/19  Blood Culture (routine x 2)     Status: Abnormal   Collection Time: 12/11/19  7:45 PM   Specimen: BLOOD  Result Value Ref Range Status   Specimen Description   Final    BLOOD RIGHT FA Performed at [facility], [address] [place], [place] [address]    Special Requests   Final    BOTTLES DRAWN AEROBIC AND ANAEROBIC Blood Culture adequate volume Performed at [facility], [address] [place], [place] [address]    Culture  Setup Time   Final    GRAM NEGATIVE RODS IN BOTH AEROBIC AND ANAEROBIC BOTTLES CRITICAL RESULT CALLED TO, READ BACK BY AND VERIFIED WITH: Rayna Sexton AT 0998 ON 12/12/19 SNG    Culture (A)  Final    PROTEUS MIRABILIS CITROBACTER YOUNGAE SUSCEPTIBILITIES PERFORMED ON PREVIOUS CULTURE WITHIN THE LAST 5 DAYS. Performed at [facility], [place] [address] [place], [place] [address]    Report Status 12/16/2019 FINAL  Final  Blood Culture (routine x 2)     Status: Abnormal   Collection Time: 12/11/19  7:45 PM   Specimen: BLOOD  Result Value Ref Range Status   Specimen  Description   Final    BLOOD RIGHT ARM Performed at [facility], [address] [place], [place] [address]    Special Requests   Final    BOTTLES DRAWN AEROBIC AND ANAEROBIC Blood Culture adequate volume Performed at [facility], [address] [place], [place] [address]    Culture  Setup Time   Final    GRAM NEGATIVE RODS IN BOTH AEROBIC AND ANAEROBIC BOTTLES CRITICAL VALUE NOTED.  VALUE IS CONSISTENT WITH PREVIOUSLY REPORTED AND CALLED VALUE. Performed at [facility], [address] [place], [place] [address]    Culture PROTEUS MIRABILIS Su Hoff  (A)  Final   Report Status 12/16/2019 FINAL  Final   Organism ID, Bacteria PROTEUS MIRABILIS  Final   Organism ID, Bacteria CITROBACTER YOUNGAE  Final      Susceptibility   Citrobacter youngae - MIC*    CEFAZOLIN >=64 RESISTANT Resistant     CEFEPIME <=1 SENSITIVE Sensitive     CEFTAZIDIME >=64 RESISTANT Resistant     CEFTRIAXONE >=64 RESISTANT Resistant     CIPROFLOXACIN <=0.25 SENSITIVE Sensitive     GENTAMICIN <=1 SENSITIVE Sensitive     IMIPENEM 1 SENSITIVE Sensitive     TRIMETH/SULFA <=20 SENSITIVE Sensitive     PIP/TAZO 64 INTERMEDIATE Intermediate     * CITROBACTER YOUNGAE   Proteus mirabilis - MIC*    AMPICILLIN <=2 SENSITIVE Sensitive     CEFAZOLIN <=4 SENSITIVE Sensitive     CEFEPIME <=1 SENSITIVE Sensitive     CEFTAZIDIME <=1 SENSITIVE Sensitive     CEFTRIAXONE <=1 SENSITIVE Sensitive     CIPROFLOXACIN <=0.25 SENSITIVE Sensitive     GENTAMICIN <=1 SENSITIVE Sensitive     IMIPENEM 4 SENSITIVE Sensitive     TRIMETH/SULFA <=20 SENSITIVE Sensitive     AMPICILLIN/SULBACTAM <=2 SENSITIVE Sensitive     PIP/TAZO <=4 SENSITIVE Sensitive     * PROTEUS MIRABILIS  Blood Culture ID Panel (Reflexed)     Status: Abnormal   Collection Time: 12/11/19  7:45 PM  Result Value Ref Range Status   Enterococcus species NOT DETECTED NOT DETECTED Final   Listeria monocytogenes NOT DETECTED  NOT DETECTED  Final   Staphylococcus species NOT DETECTED NOT DETECTED Final   Staphylococcus aureus (BCID) NOT DETECTED NOT DETECTED Final   Streptococcus species NOT DETECTED NOT DETECTED Final   Streptococcus agalactiae NOT DETECTED NOT DETECTED Final   Streptococcus pneumoniae NOT DETECTED NOT DETECTED Final   Streptococcus pyogenes NOT DETECTED NOT DETECTED Final   Acinetobacter baumannii NOT DETECTED NOT DETECTED Final   Enterobacteriaceae species DETECTED (A) NOT DETECTED Final    Comment: Enterobacteriaceae represent a large family of gram-negative bacteria, not a single organism. CRITICAL RESULT CALLED TO, READ BACK BY AND VERIFIED WITH: Rayna Sexton AT 7902 ON 12/12/19 SNG    Enterobacter cloacae complex NOT DETECTED NOT DETECTED Final   Escherichia coli NOT DETECTED NOT DETECTED Final   Klebsiella oxytoca NOT DETECTED NOT DETECTED Final   Klebsiella pneumoniae NOT DETECTED NOT DETECTED Final   Proteus species DETECTED (A) NOT DETECTED Final    Comment: CRITICAL RESULT CALLED TO, READ BACK BY AND VERIFIED WITH: Rayna Sexton AT 4097 ON 12/12/19 SNG    Serratia marcescens NOT DETECTED NOT DETECTED Final   Carbapenem resistance NOT DETECTED NOT DETECTED Final   Haemophilus influenzae NOT DETECTED NOT DETECTED Final   Neisseria meningitidis NOT DETECTED NOT DETECTED Final   Pseudomonas aeruginosa NOT DETECTED NOT DETECTED Final   Candida albicans NOT DETECTED NOT DETECTED Final   Candida glabrata NOT DETECTED NOT DETECTED Final   Candida krusei NOT DETECTED NOT DETECTED Final   Candida parapsilosis NOT DETECTED NOT DETECTED Final   Candida tropicalis NOT DETECTED NOT DETECTED Final    Comment: Performed at [facility], [address] [place], [place] [address]  Urine culture     Status: Abnormal   Collection Time: 12/11/19  8:09 PM   Specimen: In/Out Cath Urine  Result Value Ref Range Status   Specimen Description   Final    IN/OUT CATH URINE Performed at [facility], [address], [place], [place] [address]    Special Requests   Final    NONE Performed at [facility], [address] [place], [place] [address]    Culture MULTIPLE SPECIES PRESENT, SUGGEST RECOLLECTION (A)  Final   Report Status 12/12/2019 FINAL  Final  Respiratory Panel by RT PCR (Flu A&B, Covid) - Nasopharyngeal Swab     Status: None   Collection Time: 12/11/19  9:17 PM   Specimen: Nasopharyngeal Swab  Result Value Ref Range Status   SARS Coronavirus 2 by RT PCR NEGATIVE NEGATIVE Final    Comment: (NOTE) SARS-CoV-2 target nucleic acids are NOT DETECTED. The SARS-CoV-2 RNA is generally detectable in upper respiratoy specimens during the acute phase of infection. The lowest concentration of SARS-CoV-2 viral copies this assay can detect is 131 copies/mL. A negative result does not preclude SARS-Cov-2 infection and should not be used as the sole basis for treatment or other patient management decisions. A negative result may occur with  improper specimen collection/handling, submission of specimen other than nasopharyngeal swab, presence of viral mutation(s) within the areas targeted by this assay, and inadequate number of viral copies (<131 copies/mL). A negative result must be combined with clinical observations, patient history, and epidemiological information. The expected result is Negative. Fact Sheet for Patients:  PinkCheek.be Fact Sheet for Healthcare Providers:  GravelBags.it This test is not yet ap proved or cleared by the [place] FDA and  has been authorized for detection and/or diagnosis of SARS-CoV-2 by FDA under an Emergency Use Authorization (EUA). This EUA will remain  in effect (meaning this test can be used) for the duration of the COVID-19 declaration under Section 564(b)(1) of the Act, 21 U.S.C. section 360bbb-3(b)(1), unless the authorization is terminated or revoked  sooner.    Influenza A by PCR NEGATIVE NEGATIVE Final   Influenza B by PCR NEGATIVE NEGATIVE Final    Comment: (NOTE) The Xpert Xpress SARS-CoV-2/FLU/RSV assay is intended as an aid in  the diagnosis of influenza from Nasopharyngeal swab specimens and  should not be used as a sole basis for treatment. Nasal washings and  aspirates are unacceptable for Xpert Xpress SARS-CoV-2/FLU/RSV  testing. Fact Sheet for Patients: PinkCheek.be Fact Sheet for Healthcare Providers: GravelBags.it This test is not yet approved or cleared by the [place] FDA and  has been authorized for detection and/or diagnosis of SARS-CoV-2 by  FDA under an Emergency Use Authorization (EUA). This EUA will remain  in effect (meaning this test can be used) for the duration of the  Covid-19 declaration under Section 564(b)(1) of the Act, 21  U.S.C. section 360bbb-3(b)(1), unless the authorization is  terminated or revoked. Performed at [facility], [address] [place], [place] [address]   MRSA PCR Screening     Status: None   Collection Time: 12/12/19 12:29 AM   Specimen: Nasopharyngeal  Result Value Ref Range Status   MRSA by PCR NEGATIVE NEGATIVE Final    Comment:        The GeneXpert MRSA Assay (FDA approved for NASAL specimens only), is one component of a comprehensive MRSA colonization surveillance program. It is not intended to diagnose MRSA infection nor to guide or monitor treatment for MRSA infections. Performed at [facility], [address], [place], [place] [address]   Aerobic/Anaerobic Culture (surgical/deep wound)     Status: None   Collection Time: 12/12/19  2:06 AM   Specimen: Sacral; Wound  Result Value Ref Range Status   Specimen Description   Final    SACRAL Performed at [facility], [address], [place], [place] [address]    Special Requests   Final    NONE Performed at [facility], [place], [place] [address]    Gram Stain NO WBC SEEN NO ORGANISMS SEEN   Final   Culture   Final    FEW ESCHERICHIA COLI FEW CITROBACTER SPECIES FEW PROTEUS MIRABILIS FEW BACTEROIDES OVATUS BETA LACTAMASE POSITIVE Performed at [facility], [place] [address], [place], [place] [address]    Report Status 12/15/2019 FINAL  Final   Organism ID, Bacteria ESCHERICHIA COLI  Final   Organism ID, Bacteria CITROBACTER SPECIES  Final   Organism ID, Bacteria PROTEUS MIRABILIS  Final      Susceptibility   Citrobacter species - MIC*    CEFAZOLIN >=64 RESISTANT Resistant     CEFEPIME <=1 SENSITIVE Sensitive     CEFTAZIDIME >=64 RESISTANT Resistant     CEFTRIAXONE >=64 RESISTANT Resistant     CIPROFLOXACIN <=0.25 SENSITIVE Sensitive     GENTAMICIN <=1 SENSITIVE Sensitive     IMIPENEM <=0.25 SENSITIVE Sensitive     TRIMETH/SULFA <=20 SENSITIVE Sensitive     PIP/TAZO 64 INTERMEDIATE Intermediate     * FEW CITROBACTER SPECIES   Escherichia coli - MIC*    AMPICILLIN 16 INTERMEDIATE Intermediate     CEFAZOLIN >=64 RESISTANT Resistant     CEFEPIME <=1 SENSITIVE Sensitive     CEFTAZIDIME <=1 SENSITIVE Sensitive     CEFTRIAXONE <=1 SENSITIVE Sensitive  CIPROFLOXACIN <=0.25 SENSITIVE Sensitive     GENTAMICIN <=1 SENSITIVE Sensitive     IMIPENEM <=0.25 SENSITIVE Sensitive     TRIMETH/SULFA <=20 SENSITIVE Sensitive     AMPICILLIN/SULBACTAM 4 SENSITIVE Sensitive     PIP/TAZO <=4 SENSITIVE Sensitive     * FEW ESCHERICHIA COLI   Proteus mirabilis - MIC*    AMPICILLIN <=2 SENSITIVE Sensitive     CEFAZOLIN <=4 SENSITIVE Sensitive     CEFEPIME <=1 SENSITIVE Sensitive     CEFTAZIDIME <=1 SENSITIVE Sensitive     CEFTRIAXONE <=1 SENSITIVE Sensitive     CIPROFLOXACIN <=0.25 SENSITIVE Sensitive     GENTAMICIN 2 SENSITIVE Sensitive     IMIPENEM 4 SENSITIVE Sensitive     TRIMETH/SULFA <=20 SENSITIVE Sensitive     AMPICILLIN/SULBACTAM <=2 SENSITIVE Sensitive      PIP/TAZO <=4 SENSITIVE Sensitive     * FEW PROTEUS MIRABILIS  Aerobic/Anaerobic Culture (surgical/deep wound)     Status: None   Collection Time: 12/12/19  2:06 AM   Specimen: Back; Wound  Result Value Ref Range Status   Specimen Description   Final    BACK LUMBAR SPINE Performed at [facility], [address] [place], [place] [address]    Special Requests   Final    NONE Performed at [facility], [place], [place] [address]    Gram Stain NO WBC SEEN NO ORGANISMS SEEN   Final   Culture   Final    FEW PROTEUS MIRABILIS NO ANAEROBES ISOLATED Performed at [facility], [place] [address] [place], [place] [address]    Report Status 12/17/2019 FINAL  Final   Organism ID, Bacteria PROTEUS MIRABILIS  Final      Susceptibility   Proteus mirabilis - MIC*    AMPICILLIN <=2 SENSITIVE Sensitive     CEFAZOLIN <=4 SENSITIVE Sensitive     CEFEPIME <=1 SENSITIVE Sensitive     CEFTAZIDIME <=1 SENSITIVE Sensitive     CEFTRIAXONE <=1 SENSITIVE Sensitive     CIPROFLOXACIN <=0.25 SENSITIVE Sensitive     GENTAMICIN 2 SENSITIVE Sensitive     IMIPENEM 4 SENSITIVE Sensitive     TRIMETH/SULFA <=20 SENSITIVE Sensitive     AMPICILLIN/SULBACTAM <=2 SENSITIVE Sensitive     PIP/TAZO <=4 SENSITIVE Sensitive     * FEW PROTEUS MIRABILIS    Coagulation Studies: No results for input(s): LABPROT, INR in the last 72 hours.  Urinalysis: No results for input(s): COLORURINE, LABSPEC, PHURINE, GLUCOSEU, HGBUR, BILIRUBINUR, KETONESUR, PROTEINUR, UROBILINOGEN, NITRITE, LEUKOCYTESUR in the last 72 hours.  Invalid input(s): APPERANCEUR    Imaging: No results found.   Medications:   . sodium chloride Stopped (12/12/19 2140)  . albumin human Stopped (12/15/19 1209)  . ceFEPime (MAXIPIME) IV Stopped (12/18/19 2000)  . feeding supplement (NEPRO CARB STEADY) Stopped (12/18/19 1600)  . metronidazole 500 mg (12/19/19 1113)   . chlorhexidine  15 mL Mouth Rinse BID  .  Chlorhexidine Gluconate Cloth  6 each Topical Q0600  . epoetin (EPOGEN/PROCRIT) injection  10,000 Units Intravenous Q T,Th,Sa-HD  . feeding supplement (PRO-STAT SUGAR FREE 64)  30 mL Per Tube BID  . levothyroxine  50 mcg Intravenous Daily  . mouth rinse  15 mL Mouth Rinse q12n4p  . pantoprazole (PROTONIX) IV  40 mg Intravenous Q24H  . sodium chloride flush  10-40 mL Intracatheter Q12H   acetaminophen, docusate sodium, ipratropium-albuterol, ipratropium-albuterol, morphine injection, ondansetron (ZOFRAN) IV, polyethylene glycol, sodium chloride flush  Assessment/ Plan:  79 y.o. female  is a 79 y.o. black female with end stage renal disease on hemodialysis, dementia, diabetes mellitus type II, hypertension, hypothyroidism, osteomyelitis, now admitted with decreased responsiveness and sepsis.  [facility] [address] TTS RIJ [phone].  1.  ESRD on HD TTS.   Dialysis for tomorrow.   2.  Altered mental status: MRI negative. On cefepime and metronidazole.   3.  Anemia of chronic kidney disease.  Hemoglobin 7.3 - EPO with HD treatment.   4.  Secondary hyperparathyroidism: PTH and phosphorus are low. Corrected calcium at goal.   5. Hypotension: blood pressure stable off any anti-hypertensive agents.   Overall prognosis is very poor despite aggressive measures.    LOS: 8 Sarath Kolluru 5/17/202112:27 PM

## 2019-12-20 DIAGNOSIS — G061 Intraspinal abscess and granuloma: Secondary | ICD-10-CM

## 2019-12-20 DIAGNOSIS — R627 Adult failure to thrive: Secondary | ICD-10-CM

## 2019-12-20 DIAGNOSIS — R4182 Altered mental status, unspecified: Secondary | ICD-10-CM

## 2019-12-20 LAB — BASIC METABOLIC PANEL
Anion gap: 8 (ref 5–15)
BUN: 20 mg/dL (ref 8–23)
CO2: 25 mmol/L (ref 22–32)
Calcium: 8.1 mg/dL — ABNORMAL LOW (ref 8.9–10.3)
Chloride: 105 mmol/L (ref 98–111)
Creatinine, Ser: 2.3 mg/dL — ABNORMAL HIGH (ref 0.44–1.00)
GFR calc Af Amer: 23 mL/min — ABNORMAL LOW (ref >60)
GFR calc non Af Amer: 20 mL/min — ABNORMAL LOW (ref >60)
Glucose, Bld: 137 mg/dL — ABNORMAL HIGH (ref 70–99)
Potassium: 3.3 mmol/L — ABNORMAL LOW (ref 3.5–5.1)
Sodium: 138 mmol/L (ref 135–145)

## 2019-12-20 LAB — GLUCOSE, CAPILLARY
Glucose-Capillary: 113 mg/dL — ABNORMAL HIGH (ref 70–99)
Glucose-Capillary: 134 mg/dL — ABNORMAL HIGH (ref 70–99)
Glucose-Capillary: 154 mg/dL — ABNORMAL HIGH (ref 70–99)
Glucose-Capillary: 158 mg/dL — ABNORMAL HIGH (ref 70–99)

## 2019-12-20 LAB — CBC WITH DIFFERENTIAL/PLATELET
Abs Immature Granulocytes: 0.2 10*3/uL — ABNORMAL HIGH (ref 0.00–0.07)
Basophils Absolute: 0.1 10*3/uL (ref 0.0–0.1)
Basophils Relative: 1 %
Eosinophils Absolute: 0 10*3/uL (ref 0.0–0.5)
Eosinophils Relative: 0 %
HCT: 25.8 % — ABNORMAL LOW (ref 36.0–46.0)
Hemoglobin: 7.6 g/dL — ABNORMAL LOW (ref 12.0–15.0)
Immature Granulocytes: 2 %
Lymphocytes Relative: 20 %
Lymphs Abs: 2.6 10*3/uL (ref 0.7–4.0)
MCH: 27 pg (ref 26.0–34.0)
MCHC: 29.5 g/dL — ABNORMAL LOW (ref 30.0–36.0)
MCV: 91.8 fL (ref 80.0–100.0)
Monocytes Absolute: 1 10*3/uL (ref 0.1–1.0)
Monocytes Relative: 8 %
Neutro Abs: 8.8 10*3/uL — ABNORMAL HIGH (ref 1.7–7.7)
Neutrophils Relative %: 69 %
Platelets: 215 10*3/uL (ref 150–400)
RBC: 2.81 MIL/uL — ABNORMAL LOW (ref 3.87–5.11)
RDW: 22.9 % — ABNORMAL HIGH (ref 11.5–15.5)
WBC: 12.7 10*3/uL — ABNORMAL HIGH (ref 4.0–10.5)
nRBC: 0.3 % — ABNORMAL HIGH (ref 0.0–0.2)

## 2019-12-20 LAB — PHOSPHORUS: Phosphorus: 2.1 mg/dL — ABNORMAL LOW (ref 2.5–4.6)

## 2019-12-20 LAB — MAGNESIUM: Magnesium: 1.9 mg/dL (ref 1.7–2.4)

## 2019-12-20 MED ORDER — HYDROCODONE-ACETAMINOPHEN 7.5-325 MG/15ML PO SOLN
10.0000 mL | Freq: Four times a day (QID) | ORAL | Status: DC | PRN
  Filled 2019-12-20: qty 15

## 2019-12-20 MED ORDER — SODIUM CHLORIDE 0.9 % IV SOLN
500.0000 mg | INTRAVENOUS | Status: DC
  Administered 2019-12-20 – 2019-12-25 (×6): 500 mg via INTRAVENOUS
  Filled 2019-12-20: qty 0.5
  Filled 2019-12-20: qty 500
  Filled 2019-12-20: qty 0.5
  Filled 2019-12-20 (×3): qty 500
  Filled 2019-12-20: qty 0.5

## 2019-12-20 MED ORDER — NEPRO/CARBSTEADY PO LIQD
1000.0000 mL | ORAL | Status: DC
  Administered 2019-12-21: 1000 mL

## 2019-12-20 MED ORDER — HEPARIN SODIUM (PORCINE) 5000 UNIT/ML IJ SOLN
5000.0000 [IU] | Freq: Three times a day (TID) | INTRAMUSCULAR | Status: DC
  Administered 2019-12-20 – 2019-12-22 (×5): 5000 [IU] via SUBCUTANEOUS
  Filled 2019-12-20 (×5): qty 1

## 2019-12-20 MED ORDER — CIPROFLOXACIN IN D5W 400 MG/200ML IV SOLN
400.0000 mg | INTRAVENOUS | Status: DC
  Filled 2019-12-20: qty 200

## 2019-12-20 NOTE — Progress Notes (Signed)
Pharmacy Antibiotic Note  Taylor Tyler is a 79 y.o. female admitted on 12/11/2019 with sepsis. Patient with sacral wounds and concern for osteomyelitis as well as spinal abscess. Patient with h/o Proteus in lumbar culture as well as multiple organisms in wound cultures. Now presenting with Proteus and Citrobacter bacteremia. Pharmacy has been consulted for ciprofloxacin dosing. ID and surgery consulted. No plan for surgical intervention at this time.  Patient with ESRD on HD TTS. Per nephrology, plan to continue with regular dialysis schedule.  Plan: Start ciprofloxacin 400 mg IV q24hr given in the evenings.  Height: 5\' 6"  (167.6 cm) Weight: 89.8 kg (197 lb 15.6 oz) IBW/kg (Calculated) : 59.3  Temp (24hrs), Avg:98.2 F (36.8 C), Min:97.8 F (36.6 C), Max:98.5 F (36.9 C)  Recent Labs  Lab 12/16/19 0343 12/16/19 0343 12/17/19 0447 12/17/19 1131 12/18/19 0709 12/19/19 1036 12/20/19 0630  WBC 10.5   < > 9.7 13.2* 11.7* 14.6* 12.7*  CREATININE 1.44*  --   --  2.07* 1.38* 1.90* 2.30*   < > = values in this interval not displayed.    Estimated Creatinine Clearance: 23.5 mL/min (A) (by C-G formula based on SCr of 2.3 mg/dL (H)).    Allergies  Allergen Reactions  . Clams [Shellfish Allergy] Swelling and Other (See Comments)    THROAT SWELLS NECK TURNS RED  . Hydralazine Other (See Comments)    CHEST TIGHTNESS Patient has tolerated multiple doses of hydralazine since allergy was listed   . Norvasc [Amlodipine Besylate] Swelling    Leg edema   . Milk-Related Compounds Diarrhea   Microbiology Results: 5/9 BCx x2: proteus mirabilis, citrobacter youngae 5/10 Wound Cx (sacral): E.coli, citrobacter, proteus mirabilis, bacteroides ovatus 5/10 Wound Cx (lumbar spine): proteus mirabilis  Thank you for allowing pharmacy to be a part of this patient's care.  Dorena Bodo, PharmD Clinical Pharmacist 12/20/2019 4:34 PM

## 2019-12-20 NOTE — Progress Notes (Signed)
[facility]  ROUNDING NOTE   Subjective:   Seen and examined on hemodialysis treatment.   IR did aspiration of paraspinal fluid collection yesterday.    HEMODIALYSIS FLOWSHEET:  Blood Flow Rate (mL/min): 400 mL/min Arterial Pressure (mmHg): -110 mmHg Venous Pressure (mmHg): 90 mmHg Transmembrane Pressure (mmHg): 50 mmHg Ultrafiltration Rate (mL/min): 400 mL/min Dialysate Flow Rate (mL/min): 600 ml/min Conductivity: Machine : 13.8 Conductivity: Machine : 13.8 Dialysis Fluid Bolus: Normal Saline Bolus Amount (mL): 250 mL    Objective:  Vital signs in last 24 hours:  Temp:  [97.8 F (36.6 C)-98.6 F (37 C)] 97.8 F (36.6 C) (05/18 0905) Pulse Rate:  [90-117] 117 (05/18 1130) Resp:  [12-17] 16 (05/18 1130) BP: (100-154)/(49-72) 115/69 (05/18 1130) SpO2:  [96 %-100 %] 100 % (05/18 1130) Weight:  [89.8 kg] 89.8 kg (05/18 0500)  Weight change: 3.1 kg Filed Weights   12/16/19 0152 12/19/19 0500 12/20/19 0500  Weight: 79.9 kg 86.7 kg 89.8 kg    Intake/Output: I/O last 3 completed shifts: In: 729.6 [NG/GT:227.7; IV Piggyback:501.9] Out: -    Intake/Output this shift:  No intake/output data recorded.  Physical Exam: General: ill-appearing  Head: +NGT  Eyes: Anicteric  Neck: Supple, trachea midline  Lungs:  Clear to auscultation, normal effort  Heart: regular  Abdomen:  +ventral hernia  Extremities: 1+ peripheral edema.  Neurologic: Lethargic, opens eyes to verbal stimuli. No tracking  Skin: Warm/dry, sacral wound not examined (pictures reviewed)  Access: Right IJ PermCath    Basic Metabolic Panel: Recent Labs  Lab 12/16/19 0343 12/16/19 0343 12/16/19 0900 12/17/19 0447 12/17/19 1131 12/17/19 1131 12/18/19 0709 12/19/19 1036 12/20/19 0630  NA 136  --   --   --  134*  --  139 139 138  K 2.7*   < > 3.0*  --  3.6  --  3.0* 3.2* 3.3*  CL 101  --   --   --  103  --  105 106 105  CO2 28  --   --   --  24  --  27 26 25   GLUCOSE 105*  --   --    --  117*  --  177* 116* 137*  BUN 8  --   --   --  10  --  6* 14 20  CREATININE 1.44*  --   --   --  2.07*  --  1.38* 1.90* 2.30*  CALCIUM 7.3*   < >  --   --  7.6*   < > 7.5* 7.9* 8.1*  MG 1.6*  --   --  2.0  --   --  1.9 1.9 1.9  PHOS 2.1*   < >  --  3.4 3.7  --  1.9* 1.9* 2.1*   < > = values in this interval not displayed.    Liver Function Tests: Recent Labs  Lab 12/17/19 1131  ALBUMIN 1.7*   No results for input(s): LIPASE, AMYLASE in the last 168 hours. Recent Labs  Lab 12/18/19 0709  AMMONIA 61*    CBC: Recent Labs  Lab 12/16/19 0343 12/16/19 0343 12/17/19 0447 12/17/19 1131 12/18/19 0709 12/19/19 1036 12/20/19 0630  WBC 10.5   < > 9.7 13.2* 11.7* 14.6* 12.7*  NEUTROABS 7.9*  --  6.6  --  8.0* 10.4* 8.8*  HGB 7.1*   < > 7.2* 7.4* 7.3* 7.3* 7.6*  HCT 22.5*   < > 23.9* 24.7* 24.8* 24.1* 25.8*  MCV 84.6   < >  88.5 89.2 91.2 88.3 91.8  PLT 77*   < > 112* 133* 162 170 215   < > = values in this interval not displayed.    Cardiac Enzymes: No results for input(s): CKTOTAL, CKMB, CKMBINDEX, TROPONINI in the last 168 hours.  BNP: Invalid input(s): POCBNP  CBG: Recent Labs  Lab 12/19/19 1143 12/19/19 1656 12/19/19 2044 12/20/19 0055 12/20/19 0430  GLUCAP 105* 95 99 134* 113*    Microbiology: Results for orders placed or performed during the hospital encounter of 12/11/19  Blood Culture (routine x 2)     Status: Abnormal   Collection Time: 12/11/19  7:45 PM   Specimen: BLOOD  Result Value Ref Range Status   Specimen Description   Final    BLOOD RIGHT FA Performed at [facility], [address], [place], [place] [address]    Special Requests   Final    BOTTLES DRAWN AEROBIC AND ANAEROBIC Blood Culture adequate volume Performed at [facility], [address] [place], [place] [address]    Culture  Setup Time   Final    GRAM NEGATIVE RODS IN BOTH AEROBIC AND ANAEROBIC BOTTLES CRITICAL RESULT CALLED TO, READ BACK BY AND  VERIFIED WITH: Rayna Sexton AT 4481 ON 12/12/19 SNG    Culture (A)  Final    PROTEUS MIRABILIS CITROBACTER YOUNGAE SUSCEPTIBILITIES PERFORMED ON PREVIOUS CULTURE WITHIN THE LAST 5 DAYS. Performed at [facility], [place] [address] [place], [place] [address]    Report Status 12/16/2019 FINAL  Final  Blood Culture (routine x 2)     Status: Abnormal   Collection Time: 12/11/19  7:45 PM   Specimen: BLOOD  Result Value Ref Range Status   Specimen Description   Final    BLOOD RIGHT ARM Performed at [facility], [address] [place], [place] [address]    Special Requests   Final    BOTTLES DRAWN AEROBIC AND ANAEROBIC Blood Culture adequate volume Performed at [facility], [address] [place], [place] [address]    Culture  Setup Time   Final    GRAM NEGATIVE RODS IN BOTH AEROBIC AND ANAEROBIC BOTTLES CRITICAL VALUE NOTED.  VALUE IS CONSISTENT WITH PREVIOUSLY REPORTED AND CALLED VALUE. Performed at [facility], [address] [place], [place] [address]    Culture PROTEUS MIRABILIS Su Hoff  (A)  Final   Report Status 12/16/2019 FINAL  Final   Organism ID, Bacteria PROTEUS MIRABILIS  Final   Organism ID, Bacteria CITROBACTER YOUNGAE  Final      Susceptibility   Citrobacter youngae - MIC*    CEFAZOLIN >=64 RESISTANT Resistant     CEFEPIME <=1 SENSITIVE Sensitive     CEFTAZIDIME >=64 RESISTANT Resistant     CEFTRIAXONE >=64 RESISTANT Resistant     CIPROFLOXACIN <=0.25 SENSITIVE Sensitive     GENTAMICIN <=1 SENSITIVE Sensitive     IMIPENEM 1 SENSITIVE Sensitive     TRIMETH/SULFA <=20 SENSITIVE Sensitive     PIP/TAZO 64 INTERMEDIATE Intermediate     * CITROBACTER YOUNGAE   Proteus mirabilis - MIC*    AMPICILLIN <=2 SENSITIVE Sensitive     CEFAZOLIN <=4 SENSITIVE Sensitive     CEFEPIME <=1 SENSITIVE Sensitive     CEFTAZIDIME <=1 SENSITIVE Sensitive     CEFTRIAXONE <=1 SENSITIVE Sensitive     CIPROFLOXACIN <=0.25 SENSITIVE  Sensitive     GENTAMICIN <=1 SENSITIVE Sensitive     IMIPENEM 4 SENSITIVE Sensitive     TRIMETH/SULFA <=  20 SENSITIVE Sensitive     AMPICILLIN/SULBACTAM <=2 SENSITIVE Sensitive     PIP/TAZO <=4 SENSITIVE Sensitive     * PROTEUS MIRABILIS  Blood Culture ID Panel (Reflexed)     Status: Abnormal   Collection Time: 12/11/19  7:45 PM  Result Value Ref Range Status   Enterococcus species NOT DETECTED NOT DETECTED Final   Listeria monocytogenes NOT DETECTED NOT DETECTED Final   Staphylococcus species NOT DETECTED NOT DETECTED Final   Staphylococcus aureus (BCID) NOT DETECTED NOT DETECTED Final   Streptococcus species NOT DETECTED NOT DETECTED Final   Streptococcus agalactiae NOT DETECTED NOT DETECTED Final   Streptococcus pneumoniae NOT DETECTED NOT DETECTED Final   Streptococcus pyogenes NOT DETECTED NOT DETECTED Final   Acinetobacter baumannii NOT DETECTED NOT DETECTED Final   Enterobacteriaceae species DETECTED (A) NOT DETECTED Final    Comment: Enterobacteriaceae represent a large family of gram-negative bacteria, not a single organism. CRITICAL RESULT CALLED TO, READ BACK BY AND VERIFIED WITH: Rayna Sexton AT 7893 ON 12/12/19 SNG    Enterobacter cloacae complex NOT DETECTED NOT DETECTED Final   Escherichia coli NOT DETECTED NOT DETECTED Final   Klebsiella oxytoca NOT DETECTED NOT DETECTED Final   Klebsiella pneumoniae NOT DETECTED NOT DETECTED Final   Proteus species DETECTED (A) NOT DETECTED Final    Comment: CRITICAL RESULT CALLED TO, READ BACK BY AND VERIFIED WITH: Rayna Sexton AT 8101 ON 12/12/19 SNG    Serratia marcescens NOT DETECTED NOT DETECTED Final   Carbapenem resistance NOT DETECTED NOT DETECTED Final   Haemophilus influenzae NOT DETECTED NOT DETECTED Final   Neisseria meningitidis NOT DETECTED NOT DETECTED Final   Pseudomonas aeruginosa NOT DETECTED NOT DETECTED Final   Candida albicans NOT DETECTED NOT DETECTED Final   Candida glabrata NOT DETECTED NOT DETECTED Final    Candida krusei NOT DETECTED NOT DETECTED Final   Candida parapsilosis NOT DETECTED NOT DETECTED Final   Candida tropicalis NOT DETECTED NOT DETECTED Final    Comment: Performed at [facility], [address] [place], [place] [address]  Urine culture     Status: Abnormal   Collection Time: 12/11/19  8:09 PM   Specimen: In/Out Cath Urine  Result Value Ref Range Status   Specimen Description   Final    IN/OUT CATH URINE Performed at [facility], [address], [place], [place] [address]    Special Requests   Final    NONE Performed at [facility], [address] [place], [place] [address]    Culture MULTIPLE SPECIES PRESENT, SUGGEST RECOLLECTION (A)  Final   Report Status 12/12/2019 FINAL  Final  Respiratory Panel by RT PCR (Flu A&B, Covid) - Nasopharyngeal Swab     Status: None   Collection Time: 12/11/19  9:17 PM   Specimen: Nasopharyngeal Swab  Result Value Ref Range Status   SARS Coronavirus 2 by RT PCR NEGATIVE NEGATIVE Final    Comment: (NOTE) SARS-CoV-2 target nucleic acids are NOT DETECTED. The SARS-CoV-2 RNA is generally detectable in upper respiratoy specimens during the acute phase of infection. The lowest concentration of SARS-CoV-2 viral copies this assay can detect is 131 copies/mL. A negative result does not preclude SARS-Cov-2 infection and should not be used as the sole basis for treatment or other patient management decisions. A negative result may occur with  improper specimen collection/handling, submission of specimen other than nasopharyngeal swab, presence of viral mutation(s) within the areas targeted by this assay, and inadequate number of viral copies (<131 copies/mL).  A negative result must be combined with clinical observations, patient history, and epidemiological information. The expected result is Negative. Fact Sheet for Patients:  PinkCheek.be Fact Sheet for Healthcare Providers:   GravelBags.it This test is not yet ap proved or cleared by the [place] FDA and  has been authorized for detection and/or diagnosis of SARS-CoV-2 by FDA under an Emergency Use Authorization (EUA). This EUA will remain  in effect (meaning this test can be used) for the duration of the COVID-19 declaration under Section 564(b)(1) of the Act, 21 U.S.C. section 360bbb-3(b)(1), unless the authorization is terminated or revoked sooner.    Influenza A by PCR NEGATIVE NEGATIVE Final   Influenza B by PCR NEGATIVE NEGATIVE Final    Comment: (NOTE) The Xpert Xpress SARS-CoV-2/FLU/RSV assay is intended as an aid in  the diagnosis of influenza from Nasopharyngeal swab specimens and  should not be used as a sole basis for treatment. Nasal washings and  aspirates are unacceptable for Xpert Xpress SARS-CoV-2/FLU/RSV  testing. Fact Sheet for Patients: PinkCheek.be Fact Sheet for Healthcare Providers: GravelBags.it This test is not yet approved or cleared by the [place] FDA and  has been authorized for detection and/or diagnosis of SARS-CoV-2 by  FDA under an Emergency Use Authorization (EUA). This EUA will remain  in effect (meaning this test can be used) for the duration of the  Covid-19 declaration under Section 564(b)(1) of the Act, 21  U.S.C. section 360bbb-3(b)(1), unless the authorization is  terminated or revoked. Performed at [facility], [address] [place], [place] [address]   MRSA PCR Screening     Status: None   Collection Time: 12/12/19 12:29 AM   Specimen: Nasopharyngeal  Result Value Ref Range Status   MRSA by PCR NEGATIVE NEGATIVE Final    Comment:        The GeneXpert MRSA Assay (FDA approved for NASAL specimens only), is one component of a comprehensive MRSA colonization surveillance program. It is not intended to diagnose MRSA infection nor to guide or monitor  treatment for MRSA infections. Performed at [facility], [address] [place], [place] [address]   Aerobic/Anaerobic Culture (surgical/deep wound)     Status: None   Collection Time: 12/12/19  2:06 AM   Specimen: Sacral; Wound  Result Value Ref Range Status   Specimen Description   Final    SACRAL Performed at [facility], [address], [place], [place] [address]    Special Requests   Final    NONE Performed at [facility], [place], [place] [address]    Gram Stain NO WBC SEEN NO ORGANISMS SEEN   Final   Culture   Final    FEW ESCHERICHIA COLI FEW CITROBACTER SPECIES FEW PROTEUS MIRABILIS FEW BACTEROIDES OVATUS BETA LACTAMASE POSITIVE Performed at [facility], [place] [address], [place], [place] [address]    Report Status 12/15/2019 FINAL  Final   Organism ID, Bacteria ESCHERICHIA COLI  Final   Organism ID, Bacteria CITROBACTER SPECIES  Final   Organism ID, Bacteria PROTEUS MIRABILIS  Final      Susceptibility   Citrobacter species - MIC*    CEFAZOLIN >=64 RESISTANT Resistant     CEFEPIME <=1 SENSITIVE Sensitive     CEFTAZIDIME >=64 RESISTANT Resistant     CEFTRIAXONE >=64 RESISTANT Resistant     CIPROFLOXACIN <=0.25 SENSITIVE Sensitive     GENTAMICIN <=1 SENSITIVE Sensitive     IMIPENEM <=0.25 SENSITIVE Sensitive  TRIMETH/SULFA <=20 SENSITIVE Sensitive     PIP/TAZO 64 INTERMEDIATE Intermediate     * FEW CITROBACTER SPECIES   Escherichia coli - MIC*    AMPICILLIN 16 INTERMEDIATE Intermediate     CEFAZOLIN >=64 RESISTANT Resistant     CEFEPIME <=1 SENSITIVE Sensitive     CEFTAZIDIME <=1 SENSITIVE Sensitive     CEFTRIAXONE <=1 SENSITIVE Sensitive     CIPROFLOXACIN <=0.25 SENSITIVE Sensitive     GENTAMICIN <=1 SENSITIVE Sensitive     IMIPENEM <=0.25 SENSITIVE Sensitive     TRIMETH/SULFA <=20 SENSITIVE Sensitive     AMPICILLIN/SULBACTAM 4 SENSITIVE Sensitive     PIP/TAZO <=4 SENSITIVE Sensitive     * FEW  ESCHERICHIA COLI   Proteus mirabilis - MIC*    AMPICILLIN <=2 SENSITIVE Sensitive     CEFAZOLIN <=4 SENSITIVE Sensitive     CEFEPIME <=1 SENSITIVE Sensitive     CEFTAZIDIME <=1 SENSITIVE Sensitive     CEFTRIAXONE <=1 SENSITIVE Sensitive     CIPROFLOXACIN <=0.25 SENSITIVE Sensitive     GENTAMICIN 2 SENSITIVE Sensitive     IMIPENEM 4 SENSITIVE Sensitive     TRIMETH/SULFA <=20 SENSITIVE Sensitive     AMPICILLIN/SULBACTAM <=2 SENSITIVE Sensitive     PIP/TAZO <=4 SENSITIVE Sensitive     * FEW PROTEUS MIRABILIS  Aerobic/Anaerobic Culture (surgical/deep wound)     Status: None   Collection Time: 12/12/19  2:06 AM   Specimen: Back; Wound  Result Value Ref Range Status   Specimen Description   Final    BACK LUMBAR SPINE Performed at [facility], [address] [place], [place] [address]    Special Requests   Final    NONE Performed at [facility], [place], [place] [address]    Gram Stain NO WBC SEEN NO ORGANISMS SEEN   Final   Culture   Final    FEW PROTEUS MIRABILIS NO ANAEROBES ISOLATED Performed at [facility], [place] [address] [place], [place] [address]    Report Status 12/17/2019 FINAL  Final   Organism ID, Bacteria PROTEUS MIRABILIS  Final      Susceptibility   Proteus mirabilis - MIC*    AMPICILLIN <=2 SENSITIVE Sensitive     CEFAZOLIN <=4 SENSITIVE Sensitive     CEFEPIME <=1 SENSITIVE Sensitive     CEFTAZIDIME <=1 SENSITIVE Sensitive     CEFTRIAXONE <=1 SENSITIVE Sensitive     CIPROFLOXACIN <=0.25 SENSITIVE Sensitive     GENTAMICIN 2 SENSITIVE Sensitive     IMIPENEM 4 SENSITIVE Sensitive     TRIMETH/SULFA <=20 SENSITIVE Sensitive     AMPICILLIN/SULBACTAM <=2 SENSITIVE Sensitive     PIP/TAZO <=4 SENSITIVE Sensitive     * FEW PROTEUS MIRABILIS  Aerobic/Anaerobic Culture (surgical/deep wound)     Status: None (Preliminary result)   Collection Time: 12/19/19  3:08 PM   Specimen: Abscess  Result Value Ref Range Status    Specimen Description   Final    ABSCESS Performed at [facility], [address], [place], [place] [address]    Special Requests   Final    NONE Performed at [facility], [address] [place], [place] [address]    Gram Stain   Final    ABUNDANT WBC PRESENT, PREDOMINANTLY PMN RARE GRAM POSITIVE RODS RARE GRAM NEGATIVE RODS    Culture   Final    FEW GRAM NEGATIVE RODS CULTURE REINCUBATED FOR BETTER GROWTH Performed at [facility], [place]  [address] [place] [address], [place], [place] [address]    Report Status PENDING  Incomplete    Coagulation Studies: No results for input(s): LABPROT, INR in the last 72 hours.  Urinalysis: No results for input(s): COLORURINE, LABSPEC, PHURINE, GLUCOSEU, HGBUR, BILIRUBINUR, KETONESUR, PROTEINUR, UROBILINOGEN, NITRITE, LEUKOCYTESUR in the last 72 hours.  Invalid input(s): APPERANCEUR    Imaging: CT ASPIRATION  Result Date: 12/19/2019 INDICATION: 79 year old with decubitus ulcerations and an air-fluid collection in the posterior paraspinal tissues of the lumbar spine. Findings are concerning for a paraspinal abscess. Plan for CT-guided aspiration. EXAM: CT-GUIDED ASPIRATION OF POSTERIOR PARASPINAL FLUID COLLECTION MEDICATIONS: None ANESTHESIA/SEDATION: None COMPLICATIONS: None immediate. PROCEDURE: Informed consent was obtained for CT-guided aspiration. Patient was placed prone. CT images through the lumbar spine and pelvis were obtained. The posterior paraspinal air-fluid collection was identified and targeted. Skin was marked right of the upper decubitus ulcer. Skin was prepped with chlorhexidine and sterile field was created. Skin and soft tissues were anesthetized with 1% lidocaine. Using CT guidance, 18 gauge needle was directed into the paraspinal collection. Brown fluid and air was initially aspirated. Needle was slightly repositioned and small amount of serosanguineous fluid was aspirated. No additional fluid could be aspirated.  Needle was removed. Follow up CT images were obtained. Bandage placed over the puncture site. FINDINGS: Air-fluid collection in the posterior paraspinal region of the lumbar spine. Needle position was confirmed within the collection and approximately 20 mL of brown fluid and air was aspirated. Following the aspiration, there was still an air-fluid collection in the posterior paraspinal tissue. This collection appears to be complex or loculated. IMPRESSION: CT-guided aspiration of the posterior paraspinal fluid collection. 20 mL of brown fluid was removed and fluid was sent for culture. The collection appears to be complex with gas. The gas may be associated with the adjacent decubitus ulceration. Electronically Signed   By: Markus Daft M.D.   On: 12/19/2019 15:41     Medications:   . sodium chloride Stopped (12/12/19 2140)  . albumin human Stopped (12/15/19 1209)  . ceFEPime (MAXIPIME) IV Stopped (12/19/19 2111)  . feeding supplement (NEPRO CARB STEADY) 1,000 mL (12/19/19 2048)  . metronidazole Stopped (12/20/19 0046)   . chlorhexidine  15 mL Mouth Rinse BID  . Chlorhexidine Gluconate Cloth  6 each Topical Q0600  . epoetin (EPOGEN/PROCRIT) injection  10,000 Units Intravenous Q T,Th,Sa-HD  . feeding supplement (PRO-STAT SUGAR FREE 64)  30 mL Per Tube BID  . levothyroxine  50 mcg Intravenous Daily  . mouth rinse  15 mL Mouth Rinse q12n4p  . pantoprazole (PROTONIX) IV  40 mg Intravenous Q24H  . sodium chloride flush  10-40 mL Intracatheter Q12H   acetaminophen, docusate sodium, ipratropium-albuterol, ipratropium-albuterol, morphine injection, ondansetron (ZOFRAN) IV, polyethylene glycol, sodium chloride flush  Assessment/ Plan:  78 y.o. female  is a 79 y.o. black female with end stage renal disease on hemodialysis, dementia, diabetes mellitus type II, hypertension, hypothyroidism, osteomyelitis, now admitted with decreased responsiveness and sepsis.  [facility]  [address] TTS RIJ [phone].  1.  ESRD on HD TTS.   Seen and examined on hemodialysis treatment  2.  Altered mental status: MRI negative. On cefepime    3.  Anemia of chronic kidney disease.  Hemoglobin 7.6 - EPO with HD treatment.   4.  Secondary hyperparathyroidism: PTH and phosphorus are low. Corrected calcium at goal.   5. Hypotension: blood pressure stable off any anti-hypertensive agents.   Overall prognosis is very poor despite aggressive measures.  LOS: 9 Jatin Naumann 5/18/202111:51 AM

## 2019-12-20 NOTE — Progress Notes (Signed)
eeg completed ° °

## 2019-12-20 NOTE — Progress Notes (Signed)
Tube feeding increased  To 45 ml / hr  Per dietician  Larence Penning

## 2019-12-20 NOTE — Progress Notes (Signed)
Pharmacy Antibiotic Note  Taylor Tyler is a 79 y.o. female admitted on 12/11/2019 with bacteremia.  Pharmacy has been consulted for Meropenem dosing.  Was on cipro perviously but d/c'd due to concern for possible encephalopathy.  Pt is on HD every T-Th-Sat.   Plan: Will start Meropenem 500 mg IV Q24H on 5/18 @ 1800.    Height: 5\' 6"  (167.6 cm) Weight: 89.8 kg (197 lb 15.6 oz) IBW/kg (Calculated) : 59.3  Temp (24hrs), Avg:98.1 F (36.7 C), Min:97.8 F (36.6 C), Max:98.5 F (36.9 C)  Recent Labs  Lab 12/16/19 0343 12/16/19 0343 12/17/19 0447 12/17/19 1131 12/18/19 0709 12/19/19 1036 12/20/19 0630  WBC 10.5   < > 9.7 13.2* 11.7* 14.6* 12.7*  CREATININE 1.44*  --   --  2.07* 1.38* 1.90* 2.30*   < > = values in this interval not displayed.    Estimated Creatinine Clearance: 23.5 mL/min (A) (by C-G formula based on SCr of 2.3 mg/dL (H)).    Allergies  Allergen Reactions  . Clams [Shellfish Allergy] Swelling and Other (See Comments)    THROAT SWELLS NECK TURNS RED  . Hydralazine Other (See Comments)    CHEST TIGHTNESS Patient has tolerated multiple doses of hydralazine since allergy was listed   . Norvasc [Amlodipine Besylate] Swelling    Leg edema   . Milk-Related Compounds Diarrhea    Antimicrobials this admission:   >>    >>   Dose adjustments this admission:   Microbiology results:  BCx:   UCx:    Sputum:    MRSA PCR:   Thank you for allowing pharmacy to be a part of this patient's care.  Reise Gladney D 12/20/2019 5:00 PM

## 2019-12-20 NOTE — Procedures (Signed)
Patient Name: Taylor Tyler  MRN: [id number]  Epilepsy Attending: Lora Havens  Referring Physician/Provider: Dr. Loletha Grayer Date: 12/20/2019 Duration: 23.05 minutes  Patient history: 79 year old female with altered mental status.  EEG evaluate for seizures.  Level of alertness: Awake, drowsy, sleep, comatose, lethargic  AEDs during EEG study: None  Technical aspects: This EEG study was done with scalp electrodes positioned according to the 10-20 International system of electrode placement. Electrical activity was acquired at a sampling rate of 500Hz  and reviewed with a high frequency filter of 70Hz  and a low frequency filter of 1Hz . EEG data were recorded continuously and digitally stored.   Description: No clear posterior dominant rhythm seen. EEG showed continuous generalized polymorphic 3 to 6 Hz theta-delta slowing, at times with triphasic morphology. Physiology photic driving was not seen during photic stimulation.  Hyperventilation was not performed.     ABNORMALITY -Continuous slow, generalized  IMPRESSION: This study is suggestive of moderate diffuse encephalopathy, nonspecific etiology. No seizures or epileptiform discharges were seen throughout the recording.    Williams Dietrick Barbra Sarks

## 2019-12-20 NOTE — Consult Note (Addendum)
[facility] Nurse Consult Note: Reason for Consult:Chronic nonhealing lumbar surgical wound with copious purulence.  Due  Unstageable pressure injury to coccyx, present on admission, infectious.  Moisture associated skin damage to sacral skin and buttocks fold from loose stools.  Wound type:Pressure and moisture Pressure Injury POA: Yes/ Measurement:lumbar spine:  8 cm x 3 cm x 1 cm  Coccyx:  3 cm x 3 cm 100% slough Scattered 0.5 cm full thickness breakdown Wound KJZ:[id number] tissue Surgical wound is pale pink and nongranulating Drainage (amount, consistency, odor) copious purulence from lumbar wound Coccyx with minimal serosanguinous Scant serosanguinous to MASD Periwound:intact Heels are at risk and have prophylactic dressing and PRevalon boots in place.  Patient is on mattress with low air loss feature for pressure redistribution Dressing procedure/placement/frequency: Cleanse lumbar wound with NS and pat dry. Fill wound with Aquacel .  Use entire 4x4 sheet due to drainage.  Top with dry gauze and ABD pad and tape. Change daily and PRN soilage.  Cleanse coccyx wound with NS and pat dry.apply Aquacel to wound bed.  Cover with ABD pad and tape.  Change daily.Infectious disease to assess need for drain and additional intervention.   Albumin is critically low and healing is not likely with current state.  Barrier cream to buttocks and perineal skin twice daily and PRN soilage.   Will not follow at this time.  Please re-consult if needed.  Domenic Moras MSN, RN, FNP-BC [facility] Wound, Ostomy, Continence Nurse Pager [phone]

## 2019-12-20 NOTE — Progress Notes (Signed)
Palliative Note:  Chart reviewed. Updates received from RN. Patient is currently in HD.   Patient will open eyes to verbal stimuli. Remains somewhat lethargic and nonverbal. Unable to follow commands. S/P aspiration on 12/19/19 of 20 ml of paraspinal fluid from spinal abscess. Continues on normal HD schedule.   Patient's appetite remains poor. She is tolerating [place] feedings, however given mental state unless improvement she will most likely require further decisions regarding comfort with comfort feeds (if awake and able to take nutrition by mouth with awareness of high risk of aspiration) versus PEG placement (which would not be of best interest in the setting of advanced dementia, poor prognosis, and multiple co-morbidities).   Spoke with son, [name]. He verbalized recent updates by attending with a clear understanding of his mother's overall illness and decisions that will need to be made. He and his brother are thinking over things and deciding how to best move forward with decisions. He states he has no further questions at this time and they would like to continue to take things one day at a time with full code and full scope care.   Once further decisions are to be made, in the setting of no documented advanced directives, although [name] has been the main contact a discussion and clear wishes from both brothers will be needed given no designated HCPOA. [name] is aware of this from previous discussions.   All questions answered and support given.   Plan -Full Code -Continue with current plan of care per medical team -Sons are in discussion regarding decisions regarding PEG, comfort, and code status. Until then request for continued full scope treatment with hopes patient shows some improvement, despite awareness of poor prognosis.  -PMT will continue to support and follow.   .Time Total: 25 min.   Visit consisted of counseling and education dealing with the complex and emotionally  intense issues of symptom management and palliative care in the setting of serious and potentially life-threatening illness.Greater than 50%  of this time was spent counseling and coordinating care related to the above assessment and plan.  Alda Lea, AGPCNP-BC  Palliative Medicine Team [phone]

## 2019-12-20 NOTE — Progress Notes (Signed)
[name], female   DOB: 1940-09-24, 79 y.o.   MRN: [id number] [facility] PROGRESS NOTE  Taylor Tyler TDH:[id number] DOB: 12-29-1940 DOA: 12/11/2019 PCP: McLean-Scocuzza, Nino Glow, MD  HPI/Subjective: Patient opened her eyes.  I was able to lift up her left arm and she was able to hold it up there for a little while before she gradually put it down.  Right arm I was able to lift up and it flopped down on the bed.  Objective: Vitals:   12/20/19 1130 12/20/19 1144  BP: 115/69 (!) 133/6  Pulse: (!) 117 95  Resp: 16 17  Temp:  98.1 F (36.7 C)  SpO2: 100% 100%    Intake/Output Summary (Last 24 hours) at 12/20/2019 1300 Last data filed at 12/20/2019 1144 Gross per 24 hour  Intake 529.69 ml  Output -63 ml  Net 592.69 ml   Filed Weights   12/16/19 0152 12/19/19 0500 12/20/19 0500  Weight: 79.9 kg 86.7 kg 89.8 kg    ROS: Review of Systems  Unable to perform ROS: Acuity of condition   Exam: Physical Exam  Constitutional: She appears lethargic.  HENT:  Nose: No mucosal edema.  Eyes:  Patient closed her eyes when she was trying to look in them.  Again resisted me opening them  Neck: Carotid bruit is not present.  Cardiovascular: Regular rhythm, S1 normal, S2 normal and normal heart sounds.  Respiratory: No respiratory distress. She has decreased breath sounds in the right lower field and the left lower field. She has no wheezes. She has no rhonchi. She has no rales.  GI: Soft. Bowel sounds are normal. There is no abdominal tenderness.  Musculoskeletal:     Right ankle: Swelling present.     Left ankle: Swelling present.  Lymphadenopathy:    She has no cervical adenopathy.  Neurological: She appears lethargic.  Opened eyes to voice.  Patient able to lift up her left arm up off the bed and keep it up for a little while.  Skin: Skin is warm.  As per pictures large stage IV decubitus on buttocks  Psychiatric:  Opened eyes to voice.  Was able to hold her left  hand up a little bit when I lifted it up off the bed.      Data Reviewed: Basic Metabolic Panel: Recent Labs  Lab 12/16/19 0343 12/16/19 0343 12/16/19 0900 12/17/19 0447 12/17/19 1131 12/18/19 0709 12/19/19 1036 12/20/19 0630  NA 136  --   --   --  134* 139 139 138  K 2.7*   < > 3.0*  --  3.6 3.0* 3.2* 3.3*  CL 101  --   --   --  103 105 106 105  CO2 28  --   --   --  24 27 26 25   GLUCOSE 105*  --   --   --  117* 177* 116* 137*  BUN 8  --   --   --  10 6* 14 20  CREATININE 1.44*  --   --   --  2.07* 1.38* 1.90* 2.30*  CALCIUM 7.3*  --   --   --  7.6* 7.5* 7.9* 8.1*  MG 1.6*  --   --  2.0  --  1.9 1.9 1.9  PHOS 2.1*   < >  --  3.4 3.7 1.9* 1.9* 2.1*   < > = values in this interval not displayed.   Liver Function Tests: Recent Labs  Lab 12/17/19 1131  ALBUMIN 1.7*    Recent Labs  Lab 12/18/19 0709  AMMONIA 61*   CBC: Recent Labs  Lab 12/16/19 0343 12/16/19 0343 12/17/19 0447 12/17/19 1131 12/18/19 0709 12/19/19 1036 12/20/19 0630  WBC 10.5   < > 9.7 13.2* 11.7* 14.6* 12.7*  NEUTROABS 7.9*  --  6.6  --  8.0* 10.4* 8.8*  HGB 7.1*   < > 7.2* 7.4* 7.3* 7.3* 7.6*  HCT 22.5*   < > 23.9* 24.7* 24.8* 24.1* 25.8*  MCV 84.6   < > 88.5 89.2 91.2 88.3 91.8  PLT 77*   < > 112* 133* 162 170 215   < > = values in this interval not displayed.   BNP (last 3 results) Recent Labs    05/30/19 2056  BNP 851.4*    CBG: Recent Labs  Lab 12/19/19 1143 12/19/19 1656 12/19/19 2044 12/20/19 0055 12/20/19 0430  GLUCAP 105* 95 99 134* 113*    Recent Results (from the past 240 hour(s))  Blood Culture (routine x 2)     Status: Abnormal   Collection Time: 12/11/19  7:45 PM   Specimen: BLOOD  Result Value Ref Range Status   Specimen Description   Final    BLOOD RIGHT FA Performed at [facility], [address], [place], [place] [address]    Special Requests   Final    BOTTLES DRAWN AEROBIC AND ANAEROBIC Blood Culture adequate volume Performed at  [facility], [address] [place], [place] [address]    Culture  Setup Time   Final    GRAM NEGATIVE RODS IN BOTH AEROBIC AND ANAEROBIC BOTTLES CRITICAL RESULT CALLED TO, READ BACK BY AND VERIFIED WITH: Rayna Sexton AT 6195 ON 12/12/19 SNG    Culture (A)  Final    PROTEUS MIRABILIS CITROBACTER YOUNGAE SUSCEPTIBILITIES PERFORMED ON PREVIOUS CULTURE WITHIN THE LAST 5 DAYS. Performed at [facility], [place] [address] [place], [place] [address]    Report Status 12/16/2019 FINAL  Final  Blood Culture (routine x 2)     Status: Abnormal   Collection Time: 12/11/19  7:45 PM   Specimen: BLOOD  Result Value Ref Range Status   Specimen Description   Final    BLOOD RIGHT ARM Performed at [facility], [address], [place], [place] [address]    Special Requests   Final    BOTTLES DRAWN AEROBIC AND ANAEROBIC Blood Culture adequate volume Performed at [facility], [address] [place], [place] [address]    Culture  Setup Time   Final    GRAM NEGATIVE RODS IN BOTH AEROBIC AND ANAEROBIC BOTTLES CRITICAL VALUE NOTED.  VALUE IS CONSISTENT WITH PREVIOUSLY REPORTED AND CALLED VALUE. Performed at [facility], [address] [place], [place] [address]    Culture PROTEUS MIRABILIS Su Hoff  (A)  Final   Report Status 12/16/2019 FINAL  Final   Organism ID, Bacteria PROTEUS MIRABILIS  Final   Organism ID, Bacteria CITROBACTER YOUNGAE  Final      Susceptibility   Citrobacter youngae - MIC*    CEFAZOLIN >=64 RESISTANT Resistant     CEFEPIME <=1 SENSITIVE Sensitive     CEFTAZIDIME >=64 RESISTANT Resistant     CEFTRIAXONE >=64 RESISTANT Resistant     CIPROFLOXACIN <=0.25 SENSITIVE Sensitive     GENTAMICIN <=1 SENSITIVE Sensitive     IMIPENEM 1 SENSITIVE Sensitive     TRIMETH/SULFA <=20 SENSITIVE Sensitive     PIP/TAZO 64 INTERMEDIATE Intermediate     *  CITROBACTER YOUNGAE   Proteus mirabilis - MIC*    AMPICILLIN <=2 SENSITIVE  Sensitive     CEFAZOLIN <=4 SENSITIVE Sensitive     CEFEPIME <=1 SENSITIVE Sensitive     CEFTAZIDIME <=1 SENSITIVE Sensitive     CEFTRIAXONE <=1 SENSITIVE Sensitive     CIPROFLOXACIN <=0.25 SENSITIVE Sensitive     GENTAMICIN <=1 SENSITIVE Sensitive     IMIPENEM 4 SENSITIVE Sensitive     TRIMETH/SULFA <=20 SENSITIVE Sensitive     AMPICILLIN/SULBACTAM <=2 SENSITIVE Sensitive     PIP/TAZO <=4 SENSITIVE Sensitive     * PROTEUS MIRABILIS  Blood Culture ID Panel (Reflexed)     Status: Abnormal   Collection Time: 12/11/19  7:45 PM  Result Value Ref Range Status   Enterococcus species NOT DETECTED NOT DETECTED Final   Listeria monocytogenes NOT DETECTED NOT DETECTED Final   Staphylococcus species NOT DETECTED NOT DETECTED Final   Staphylococcus aureus (BCID) NOT DETECTED NOT DETECTED Final   Streptococcus species NOT DETECTED NOT DETECTED Final   Streptococcus agalactiae NOT DETECTED NOT DETECTED Final   Streptococcus pneumoniae NOT DETECTED NOT DETECTED Final   Streptococcus pyogenes NOT DETECTED NOT DETECTED Final   Acinetobacter baumannii NOT DETECTED NOT DETECTED Final   Enterobacteriaceae species DETECTED (A) NOT DETECTED Final    Comment: Enterobacteriaceae represent a large family of gram-negative bacteria, not a single organism. CRITICAL RESULT CALLED TO, READ BACK BY AND VERIFIED WITH: Rayna Sexton AT 3299 ON 12/12/19 SNG    Enterobacter cloacae complex NOT DETECTED NOT DETECTED Final   Escherichia coli NOT DETECTED NOT DETECTED Final   Klebsiella oxytoca NOT DETECTED NOT DETECTED Final   Klebsiella pneumoniae NOT DETECTED NOT DETECTED Final   Proteus species DETECTED (A) NOT DETECTED Final    Comment: CRITICAL RESULT CALLED TO, READ BACK BY AND VERIFIED WITH: Rayna Sexton AT 2426 ON 12/12/19 SNG    Serratia marcescens NOT DETECTED NOT DETECTED Final   Carbapenem resistance NOT DETECTED NOT DETECTED Final   Haemophilus influenzae NOT DETECTED NOT DETECTED Final   Neisseria  meningitidis NOT DETECTED NOT DETECTED Final   Pseudomonas aeruginosa NOT DETECTED NOT DETECTED Final   Candida albicans NOT DETECTED NOT DETECTED Final   Candida glabrata NOT DETECTED NOT DETECTED Final   Candida krusei NOT DETECTED NOT DETECTED Final   Candida parapsilosis NOT DETECTED NOT DETECTED Final   Candida tropicalis NOT DETECTED NOT DETECTED Final    Comment: Performed at [facility], [address], [place], [place] [address]  Urine culture     Status: Abnormal   Collection Time: 12/11/19  8:09 PM   Specimen: In/Out Cath Urine  Result Value Ref Range Status   Specimen Description   Final    IN/OUT CATH URINE Performed at [facility], [address] [place], [place] [address]    Special Requests   Final    NONE Performed at [facility], [address] [place], [place] [address]    Culture MULTIPLE SPECIES PRESENT, SUGGEST RECOLLECTION (A)  Final   Report Status 12/12/2019 FINAL  Final  Respiratory Panel by RT PCR (Flu A&B, Covid) - Nasopharyngeal Swab     Status: None   Collection Time: 12/11/19  9:17 PM   Specimen: Nasopharyngeal Swab  Result Value Ref Range Status   SARS Coronavirus 2 by RT PCR NEGATIVE NEGATIVE Final    Comment: (NOTE) SARS-CoV-2 target nucleic acids are NOT DETECTED. The SARS-CoV-2 RNA is generally detectable in upper respiratoy specimens during the acute  phase of infection. The lowest concentration of SARS-CoV-2 viral copies this assay can detect is 131 copies/mL. A negative result does not preclude SARS-Cov-2 infection and should not be used as the sole basis for treatment or other patient management decisions. A negative result may occur with  improper specimen collection/handling, submission of specimen other than nasopharyngeal swab, presence of viral mutation(s) within the areas targeted by this assay, and inadequate number of viral copies (<131 copies/mL). A negative result must be combined with  clinical observations, patient history, and epidemiological information. The expected result is Negative. Fact Sheet for Patients:  PinkCheek.be Fact Sheet for Healthcare Providers:  GravelBags.it This test is not yet ap proved or cleared by the [place] FDA and  has been authorized for detection and/or diagnosis of SARS-CoV-2 by FDA under an Emergency Use Authorization (EUA). This EUA will remain  in effect (meaning this test can be used) for the duration of the COVID-19 declaration under Section 564(b)(1) of the Act, 21 U.S.C. section 360bbb-3(b)(1), unless the authorization is terminated or revoked sooner.    Influenza A by PCR NEGATIVE NEGATIVE Final   Influenza B by PCR NEGATIVE NEGATIVE Final    Comment: (NOTE) The Xpert Xpress SARS-CoV-2/FLU/RSV assay is intended as an aid in  the diagnosis of influenza from Nasopharyngeal swab specimens and  should not be used as a sole basis for treatment. Nasal washings and  aspirates are unacceptable for Xpert Xpress SARS-CoV-2/FLU/RSV  testing. Fact Sheet for Patients: PinkCheek.be Fact Sheet for Healthcare Providers: GravelBags.it This test is not yet approved or cleared by the [place] FDA and  has been authorized for detection and/or diagnosis of SARS-CoV-2 by  FDA under an Emergency Use Authorization (EUA). This EUA will remain  in effect (meaning this test can be used) for the duration of the  Covid-19 declaration under Section 564(b)(1) of the Act, 21  U.S.C. section 360bbb-3(b)(1), unless the authorization is  terminated or revoked. Performed at [facility], [address] [place], [place] [address]   MRSA PCR Screening     Status: None   Collection Time: 12/12/19 12:29 AM   Specimen: Nasopharyngeal  Result Value Ref Range Status   MRSA by PCR NEGATIVE NEGATIVE Final    Comment:         The GeneXpert MRSA Assay (FDA approved for NASAL specimens only), is one component of a comprehensive MRSA colonization surveillance program. It is not intended to diagnose MRSA infection nor to guide or monitor treatment for MRSA infections. Performed at [facility], [address] [place], [place] [address]   Aerobic/Anaerobic Culture (surgical/deep wound)     Status: None   Collection Time: 12/12/19  2:06 AM   Specimen: Sacral; Wound  Result Value Ref Range Status   Specimen Description   Final    SACRAL Performed at [facility], [address] [place], [place] [address]    Special Requests   Final    NONE Performed at [facility], [place], [place] [address]    Gram Stain NO WBC SEEN NO ORGANISMS SEEN   Final   Culture   Final    FEW ESCHERICHIA COLI FEW CITROBACTER SPECIES FEW PROTEUS MIRABILIS FEW BACTEROIDES OVATUS BETA LACTAMASE POSITIVE Performed at [facility], [place] [address] [place], [place] [address]    Report Status 12/15/2019 FINAL  Final   Organism ID, Bacteria ESCHERICHIA COLI  Final   Organism ID, Bacteria CITROBACTER SPECIES  Final  Organism ID, Bacteria PROTEUS MIRABILIS  Final      Susceptibility   Citrobacter species - MIC*    CEFAZOLIN >=64 RESISTANT Resistant     CEFEPIME <=1 SENSITIVE Sensitive     CEFTAZIDIME >=64 RESISTANT Resistant     CEFTRIAXONE >=64 RESISTANT Resistant     CIPROFLOXACIN <=0.25 SENSITIVE Sensitive     GENTAMICIN <=1 SENSITIVE Sensitive     IMIPENEM <=0.25 SENSITIVE Sensitive     TRIMETH/SULFA <=20 SENSITIVE Sensitive     PIP/TAZO 64 INTERMEDIATE Intermediate     * FEW CITROBACTER SPECIES   Escherichia coli - MIC*    AMPICILLIN 16 INTERMEDIATE Intermediate     CEFAZOLIN >=64 RESISTANT Resistant     CEFEPIME <=1 SENSITIVE Sensitive     CEFTAZIDIME <=1 SENSITIVE Sensitive     CEFTRIAXONE <=1 SENSITIVE Sensitive     CIPROFLOXACIN <=0.25 SENSITIVE Sensitive      GENTAMICIN <=1 SENSITIVE Sensitive     IMIPENEM <=0.25 SENSITIVE Sensitive     TRIMETH/SULFA <=20 SENSITIVE Sensitive     AMPICILLIN/SULBACTAM 4 SENSITIVE Sensitive     PIP/TAZO <=4 SENSITIVE Sensitive     * FEW ESCHERICHIA COLI   Proteus mirabilis - MIC*    AMPICILLIN <=2 SENSITIVE Sensitive     CEFAZOLIN <=4 SENSITIVE Sensitive     CEFEPIME <=1 SENSITIVE Sensitive     CEFTAZIDIME <=1 SENSITIVE Sensitive     CEFTRIAXONE <=1 SENSITIVE Sensitive     CIPROFLOXACIN <=0.25 SENSITIVE Sensitive     GENTAMICIN 2 SENSITIVE Sensitive     IMIPENEM 4 SENSITIVE Sensitive     TRIMETH/SULFA <=20 SENSITIVE Sensitive     AMPICILLIN/SULBACTAM <=2 SENSITIVE Sensitive     PIP/TAZO <=4 SENSITIVE Sensitive     * FEW PROTEUS MIRABILIS  Aerobic/Anaerobic Culture (surgical/deep wound)     Status: None   Collection Time: 12/12/19  2:06 AM   Specimen: Back; Wound  Result Value Ref Range Status   Specimen Description   Final    BACK LUMBAR SPINE Performed at [facility], [address] [place], [place] [address]    Special Requests   Final    NONE Performed at [facility], [place], [place] [address]    Gram Stain NO WBC SEEN NO ORGANISMS SEEN   Final   Culture   Final    FEW PROTEUS MIRABILIS NO ANAEROBES ISOLATED Performed at [facility], [place] [address] [place], [place] [address]    Report Status 12/17/2019 FINAL  Final   Organism ID, Bacteria PROTEUS MIRABILIS  Final      Susceptibility   Proteus mirabilis - MIC*    AMPICILLIN <=2 SENSITIVE Sensitive     CEFAZOLIN <=4 SENSITIVE Sensitive     CEFEPIME <=1 SENSITIVE Sensitive     CEFTAZIDIME <=1 SENSITIVE Sensitive     CEFTRIAXONE <=1 SENSITIVE Sensitive     CIPROFLOXACIN <=0.25 SENSITIVE Sensitive     GENTAMICIN 2 SENSITIVE Sensitive     IMIPENEM 4 SENSITIVE Sensitive     TRIMETH/SULFA <=20 SENSITIVE Sensitive     AMPICILLIN/SULBACTAM <=2 SENSITIVE Sensitive     PIP/TAZO <=4 SENSITIVE  Sensitive     * FEW PROTEUS MIRABILIS  Aerobic/Anaerobic Culture (surgical/deep wound)     Status: None (Preliminary result)   Collection Time: 12/19/19  3:08 PM   Specimen: Abscess  Result Value Ref Range Status   Specimen Description   Final    ABSCESS Performed at [name], [address] [place],  [place] [address]    Special Requests   Final    NONE Performed at [facility], [place], [place] [address]    Gram Stain   Final    ABUNDANT WBC PRESENT, PREDOMINANTLY PMN RARE GRAM POSITIVE RODS RARE GRAM NEGATIVE RODS    Culture   Final    FEW GRAM NEGATIVE RODS CULTURE REINCUBATED FOR BETTER GROWTH Performed at [facility], [place] [address], [place], [place] [address]    Report Status PENDING  Incomplete     Studies: CT ASPIRATION  Result Date: 12/19/2019 INDICATION: 79 year old with decubitus ulcerations and an air-fluid collection in the posterior paraspinal tissues of the lumbar spine. Findings are concerning for a paraspinal abscess. Plan for CT-guided aspiration. EXAM: CT-GUIDED ASPIRATION OF POSTERIOR PARASPINAL FLUID COLLECTION MEDICATIONS: None ANESTHESIA/SEDATION: None COMPLICATIONS: None immediate. PROCEDURE: Informed consent was obtained for CT-guided aspiration. Patient was placed prone. CT images through the lumbar spine and pelvis were obtained. The posterior paraspinal air-fluid collection was identified and targeted. Skin was marked right of the upper decubitus ulcer. Skin was prepped with chlorhexidine and sterile field was created. Skin and soft tissues were anesthetized with 1% lidocaine. Using CT guidance, 18 gauge needle was directed into the paraspinal collection. Brown fluid and air was initially aspirated. Needle was slightly repositioned and small amount of serosanguineous fluid was aspirated. No additional fluid could be aspirated. Needle was removed. Follow up CT images were obtained. Bandage placed over the  puncture site. FINDINGS: Air-fluid collection in the posterior paraspinal region of the lumbar spine. Needle position was confirmed within the collection and approximately 20 mL of brown fluid and air was aspirated. Following the aspiration, there was still an air-fluid collection in the posterior paraspinal tissue. This collection appears to be complex or loculated. IMPRESSION: CT-guided aspiration of the posterior paraspinal fluid collection. 20 mL of brown fluid was removed and fluid was sent for culture. The collection appears to be complex with gas. The gas may be associated with the adjacent decubitus ulceration. Electronically Signed   By: Markus Daft M.D.   On: 12/19/2019 15:41    Scheduled Meds: . chlorhexidine  15 mL Mouth Rinse BID  . Chlorhexidine Gluconate Cloth  6 each Topical Q0600  . epoetin (EPOGEN/PROCRIT) injection  10,000 Units Intravenous Q T,Th,Sa-HD  . feeding supplement (PRO-STAT SUGAR FREE 64)  30 mL Per Tube BID  . levothyroxine  50 mcg Intravenous Daily  . mouth rinse  15 mL Mouth Rinse q12n4p  . pantoprazole (PROTONIX) IV  40 mg Intravenous Q24H  . sodium chloride flush  10-40 mL Intracatheter Q12H   Continuous Infusions: . sodium chloride Stopped (12/12/19 2140)  . albumin human Stopped (12/15/19 1209)  . ceFEPime (MAXIPIME) IV Stopped (12/19/19 2111)  . feeding supplement (NEPRO CARB STEADY) 1,000 mL (12/19/19 2048)  . metronidazole Stopped (12/20/19 0046)    Assessment/Plan:  1. Septic shock present on admission.  Blood cultures positive for Proteus mirabilis and Citrobacter.  Patient on Maxipime and Flagyl.  Source is the stage IV decubitus (present on admission) and abscess.  Interventional radiology drew off 20 cc of pus yesterday cultures still pending.  As per infectious disease specialist the patient will need IV cefepime for 4 to 6 weeks.  Wound care consult. 2. Acute metabolic encephalopathy.  Likely from sepsis.  Underlying dementia.  MRI of the brain  negative for stroke.  EEG ordered. 3. Failure to thrive.  NG tube placed for tube feedings.  Case discussed  with son on the phone that we usually do not put in a PEG tube for people with a history of dementia and I am hoping that her mental status gets better but if it does not get better then what do we do.  Son will think about things. 4. End-stage renal disease on hemodialysis Tuesday, Thursday, Saturday.   5. Anemia of chronic disease.  Continue to monitor hemoglobin. 6. Hypothyroidism unspecified looks like she takes Synthroid at home 100 mcg I will start Synthroid 50 mcg IV daily.  TSH slightly high.   Pressure Injury 11/19/19 Sacrum Mid;Right Unstageable - Full thickness tissue loss in which the base of the injury is covered by slough (yellow, tan, gray, green or brown) and/or eschar (tan, brown or black) in the wound bed. deep wound with bed covered  (Active)  11/19/19 1800  Location: Sacrum  Location Orientation: Mid;Right  Staging: Unstageable - Full thickness tissue loss in which the base of the injury is covered by slough (yellow, tan, gray, green or brown) and/or eschar (tan, brown or black) in the wound bed.  Wound Description (Comments): deep wound with bed covered by slough  Present on Admission: Yes     Pressure Injury 11/19/19 Heel Left Deep Tissue Pressure Injury - Purple or maroon localized area of discolored intact skin or blood-filled blister due to damage of underlying soft tissue from pressure and/or shear. (Active)  11/19/19 1800  Location: Heel  Location Orientation: Left  Staging: Deep Tissue Pressure Injury - Purple or maroon localized area of discolored intact skin or blood-filled blister due to damage of underlying soft tissue from pressure and/or shear.  Wound Description (Comments):   Present on Admission: Yes       Code Status:     Code Status Orders  (From admission, onward)         Start     Ordered   12/11/19 2044  Full code  Continuous      12/11/19 2048        Code Status History    Date Active Date Inactive Code Status Order ID Comments User Context   11/19/2019 1527 11/23/2019 2115 Full Code [id number]  Collier Bullock, MD ED   11/03/2019 1234 11/09/2019 2034 Full Code [id number]  Collier Bullock, MD ED   05/31/2019 0016 07/08/2019 1758 Full Code [id number]  Rise Patience, MD ED   06/05/2018 1409 06/12/2018 1255 Full Code [id number]  Thurnell Lose, MD Inpatient   06/05/2018 1358 06/05/2018 1409 Full Code [id number]  Thurnell Lose, MD Inpatient   06/04/2018 0559 06/05/2018 1326 Full Code [id number]  Harrie Foreman, MD Inpatient   03/29/2017 2104 04/07/2017 2125 Full Code [id number]  Everrett Coombe, MD Inpatient   02/24/2017 1450 03/03/2017 1532 Full Code [id number]  Traci Sermon, PA-C Inpatient   02/24/2017 1450 02/24/2017 1450 Full Code [id number]  Elpidio Eric Inpatient   01/15/2017 1145 01/16/2017 2314 Full Code [id number]  Samella Parr, NP Inpatient   07/06/2016 1908 07/10/2016 1736 Full Code [id number]  Nicholes Mango, MD Inpatient   Advance Care Planning Activity     Family Communication: Spoke with son [name] on the phone Disposition Plan: Status is: Inpatient  Dispo: The patient is from: Acute rehab              Anticipated d/c is to: Potentially back to rehab              Anticipated d/c date is: Unknown at this point  Patient currently not stable enough for disposition at this point time.  Patient will need 4 to 6 weeks of IV cefepime. Mental status not well enough to support her nutritional status.  Currently have an NG tube in and on low-dose feeding.  I told the patient's son that we normally do not keep an NG tube for too long because they start eroding the nose and the esophagus and tissues in the back of the mouth.  If the patient does not regain mental status and overall prognosis is poor.  Usually we do not place PEG feeding tubes in on people that have a history of dementia.  Son will  think about things on how to proceed moving forward.  Palliative care also on the case.  Consultants:  Nephrology  Infectious disease  Palliative care  Antibiotics:  Cefepime  Flagyl  Time spent: 27 minutes  [place]

## 2019-12-20 NOTE — Plan of Care (Signed)
Pt continues with lethargy and need of assistance with all ADLs Problem: Education: Goal: Knowledge of General Education information will improve Description: Including pain rating scale, medication(s)/side effects and non-pharmacologic comfort measures Outcome: Not Progressing   Problem: Health Behavior/Discharge Planning: Goal: Ability to manage health-related needs will improve Outcome: Not Progressing   Problem: Activity: Goal: Risk for activity intolerance will decrease Outcome: Not Progressing

## 2019-12-21 ENCOUNTER — Encounter: Payer: Self-pay | Admitting: Internal Medicine

## 2019-12-21 LAB — GLUCOSE, CAPILLARY
Glucose-Capillary: 124 mg/dL — ABNORMAL HIGH (ref 70–99)
Glucose-Capillary: 128 mg/dL — ABNORMAL HIGH (ref 70–99)
Glucose-Capillary: 149 mg/dL — ABNORMAL HIGH (ref 70–99)
Glucose-Capillary: 168 mg/dL — ABNORMAL HIGH (ref 70–99)
Glucose-Capillary: 169 mg/dL — ABNORMAL HIGH (ref 70–99)
Glucose-Capillary: 175 mg/dL — ABNORMAL HIGH (ref 70–99)
Glucose-Capillary: 177 mg/dL — ABNORMAL HIGH (ref 70–99)
Glucose-Capillary: 187 mg/dL — ABNORMAL HIGH (ref 70–99)

## 2019-12-21 NOTE — Progress Notes (Signed)
ID continues to be lethargic  with no response other than opening eyes  Patient Vitals for the past 24 hrs:  BP Temp Temp src Pulse Resp SpO2 Weight  12/21/19 1542 (!) 161/63 98.9 F (37.2 C) Oral 89 16 98 % --  12/21/19 0731 131/72 98.7 F (37.1 C) -- 95 14 100 % --  12/21/19 0500 -- -- -- -- -- -- 90.3 kg  12/20/19 2312 (!) 127/57 98 F (36.7 C) Oral 98 14 100 % --   NG tube Chest b/l air entry Non verbal  CNS -cannot be assessed    CBC Latest Ref Rng & Units 12/20/2019 12/19/2019 12/18/2019  WBC 4.0 - 10.5 K/uL 12.7(H) 14.6(H) 11.7(H)  Hemoglobin 12.0 - 15.0 g/dL 7.6(L) 7.3(L) 7.3(L)  Hematocrit 36.0 - 46.0 % 25.8(L) 24.1(L) 24.8(L)  Platelets 150 - 400 K/uL 215 170 162    CMP Latest Ref Rng & Units 12/20/2019 12/19/2019 12/18/2019  Glucose 70 - 99 mg/dL 137(H) 116(H) 177(H)  BUN 8 - 23 mg/dL 20 14 6(L)  Creatinine 0.44 - 1.00 mg/dL 2.30(H) 1.90(H) 1.38(H)  Sodium 135 - 145 mmol/L 138 139 139  Potassium 3.5 - 5.1 mmol/L 3.3(L) 3.2(L) 3.0(L)  Chloride 98 - 111 mmol/L 105 106 105  CO2 22 - 32 mmol/L 25 26 27   Calcium 8.9 - 10.3 mg/dL 8.1(L) 7.9(L) 7.5(L)  Total Protein 6.5 - 8.1 g/dL - - -  Total Bilirubin 0.3 - 1.2 mg/dL - - -  Alkaline Phos 38 - 126 U/L - - -  AST 15 - 41 U/L - - -  ALT 0 - 44 U/L - - -    Microbiology  Blood culture from 12/11/2019 4 out of 4 bottles Proteus mirabilis and Citrobacter youngae Proteus is pansensitive Citrobacter is sensitive to cefepime, ciprofloxacin, gentamicin and imipenem and trimethoprim sulfamethoxazole    12/12/2019 Site of prior surgery at the lumbar spine L4 area Proteus mirabilis pansensitive.  12/12/2019 from the new sacral ulcer Multiple organisms including E. coli, Citrobacter, Bacteroides, Proteus.  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 The recent CT abdomen shows 6.2 cm x 4.5 cm area of air and fluidcolectionseen posterior to the spinal canal at the level of the L4 vertebral body.The lumbar wound has proteus in the culture IR aspirated 20 cc of fluid from the paraspinal area on 12/19/19 and it is proteus Could not leave a drain as the thecal sac is close by   Stage IV sacral decubitus- debrided The sacral ulcer has citrobacter , e.coli, proteus, anerobes ( bacteroides)  Pt was on cefepime + flagyl and because of encephalopathy they were discontinued on 5/18 and she is now on meropenem  Encephalopathy   ESRD on dialysis  Pt has poor prognosis   Would decide whether she needs IV or could go on PO cipro depending on her condition. Doubt  antibiotic is ever going to cure the infection in the spine area especially with her being bed bound and having new pressure wounds as well Palliative on board -

## 2019-12-21 NOTE — Progress Notes (Signed)
[facility]  ROUNDING NOTE   Subjective:   Hemodialysis treatment yesterday. Did not tolerate treatment. Episodes of hypotension. No ultrafiltration.    Objective:  Vital signs in last 24 hours:  Temp:  [97.9 F (36.6 C)-98.7 F (37.1 C)] 98.7 F (37.1 C) (05/19 0731) Pulse Rate:  [91-98] 95 (05/19 0731) Resp:  [14-16] 14 (05/19 0731) BP: (127-136)/(57-72) 131/72 (05/19 0731) SpO2:  [100 %] 100 % (05/19 0731) Weight:  [90.3 kg] 90.3 kg (05/19 0500)  Weight change: 0.5 kg Filed Weights   12/19/19 0500 12/20/19 0500 12/21/19 0500  Weight: 86.7 kg 89.8 kg 90.3 kg    Intake/Output: I/O last 3 completed shifts: In: 324 [NG/GT:124; IV Piggyback:200] Out: -63    Intake/Output this shift:  No intake/output data recorded.  Physical Exam: General: ill-appearing  Head: +NGT  Eyes: Anicteric  Neck: Supple, trachea midline  Lungs:  Clear to auscultation, normal effort  Heart: regular  Abdomen:  + ventral hernia  Extremities: 1+ peripheral edema.  Neurologic: Lethargic, opens eyes to verbal stimuli. No tracking  Skin: Warm/dry, sacral wound not examined (pictures reviewed)  Access: Right IJ PermCath    Basic Metabolic Panel: Recent Labs  Lab 12/16/19 0343 12/16/19 0343 12/16/19 0900 12/17/19 0447 12/17/19 1131 12/17/19 1131 12/18/19 0709 12/19/19 1036 12/20/19 0630  NA 136  --   --   --  134*  --  139 139 138  K 2.7*   < > 3.0*  --  3.6  --  3.0* 3.2* 3.3*  CL 101  --   --   --  103  --  105 106 105  CO2 28  --   --   --  24  --  27 26 25   GLUCOSE 105*  --   --   --  117*  --  177* 116* 137*  BUN 8  --   --   --  10  --  6* 14 20  CREATININE 1.44*  --   --   --  2.07*  --  1.38* 1.90* 2.30*  CALCIUM 7.3*   < >  --   --  7.6*   < > 7.5* 7.9* 8.1*  MG 1.6*  --   --  2.0  --   --  1.9 1.9 1.9  PHOS 2.1*   < >  --  3.4 3.7  --  1.9* 1.9* 2.1*   < > = values in this interval not displayed.    Liver Function Tests: Recent Labs  Lab 12/17/19 1131   ALBUMIN 1.7*   No results for input(s): LIPASE, AMYLASE in the last 168 hours. Recent Labs  Lab 12/18/19 0709  AMMONIA 61*    CBC: Recent Labs  Lab 12/16/19 0343 12/16/19 0343 12/17/19 0447 12/17/19 1131 12/18/19 0709 12/19/19 1036 12/20/19 0630  WBC 10.5   < > 9.7 13.2* 11.7* 14.6* 12.7*  NEUTROABS 7.9*  --  6.6  --  8.0* 10.4* 8.8*  HGB 7.1*   < > 7.2* 7.4* 7.3* 7.3* 7.6*  HCT 22.5*   < > 23.9* 24.7* 24.8* 24.1* 25.8*  MCV 84.6   < > 88.5 89.2 91.2 88.3 91.8  PLT 77*   < > 112* 133* 162 170 215   < > = values in this interval not displayed.    Cardiac Enzymes: No results for input(s): CKTOTAL, CKMB, CKMBINDEX, TROPONINI in the last 168 hours.  BNP: Invalid input(s): POCBNP  CBG: Recent Labs  Lab 12/20/19 2033  12/20/19 2317 12/21/19 0352 12/21/19 0737 12/21/19 1154  GLUCAP 154* 158* 149* 169* 187*    Microbiology: Results for orders placed or performed during the hospital encounter of 12/11/19  Blood Culture (routine x 2)     Status: Abnormal   Collection Time: 12/11/19  7:45 PM   Specimen: BLOOD  Result Value Ref Range Status   Specimen Description   Final    BLOOD RIGHT FA Performed at [facility], [address], [place], [place] [address]    Special Requests   Final    BOTTLES DRAWN AEROBIC AND ANAEROBIC Blood Culture adequate volume Performed at [facility], [address] [place], [place] [address]    Culture  Setup Time   Final    GRAM NEGATIVE RODS IN BOTH AEROBIC AND ANAEROBIC BOTTLES CRITICAL RESULT CALLED TO, READ BACK BY AND VERIFIED WITH: Rayna Sexton AT 2542 ON 12/12/19 SNG    Culture (A)  Final    PROTEUS MIRABILIS CITROBACTER YOUNGAE SUSCEPTIBILITIES PERFORMED ON PREVIOUS CULTURE WITHIN THE LAST 5 DAYS. Performed at [facility], [place] [address] [place], [place] [address]    Report Status 12/16/2019 FINAL  Final  Blood Culture (routine x 2)     Status: Abnormal   Collection Time: 12/11/19  7:45 PM    Specimen: BLOOD  Result Value Ref Range Status   Specimen Description   Final    BLOOD RIGHT ARM Performed at [facility], [address], [place], [place] [address]    Special Requests   Final    BOTTLES DRAWN AEROBIC AND ANAEROBIC Blood Culture adequate volume Performed at [facility], [address], [place], [place] [address]    Culture  Setup Time   Final    GRAM NEGATIVE RODS IN BOTH AEROBIC AND ANAEROBIC BOTTLES CRITICAL VALUE NOTED.  VALUE IS CONSISTENT WITH PREVIOUSLY REPORTED AND CALLED VALUE. Performed at [facility], [address] [place],  [address]    Culture PROTEUS MIRABILIS Su [place]  (A)  Final   Report Status 12/16/2019 FINAL  Final   Organism ID, Bacteria PROTEUS MIRABILIS  Final   Organism ID, Bacteria CITROBACTER YOUNGAE  Final      Susceptibility   Citrobacter youngae - MIC*    CEFAZOLIN >=64 RESISTANT Resistant     CEFEPIME <=1 SENSITIVE Sensitive     CEFTAZIDIME >=64 RESISTANT Resistant     CEFTRIAXONE >=64 RESISTANT Resistant     CIPROFLOXACIN <=0.25 SENSITIVE Sensitive     GENTAMICIN <=1 SENSITIVE Sensitive     IMIPENEM 1 SENSITIVE Sensitive     TRIMETH/SULFA <=20 SENSITIVE Sensitive     PIP/TAZO 64 INTERMEDIATE Intermediate     * CITROBACTER YOUNGAE   Proteus mirabilis - MIC*    AMPICILLIN <=2 SENSITIVE Sensitive     CEFAZOLIN <=4 SENSITIVE Sensitive     CEFEPIME <=1 SENSITIVE Sensitive     CEFTAZIDIME <=1 SENSITIVE Sensitive     CEFTRIAXONE <=1 SENSITIVE Sensitive     CIPROFLOXACIN <=0.25 SENSITIVE Sensitive     GENTAMICIN <=1 SENSITIVE Sensitive     IMIPENEM 4 SENSITIVE Sensitive     TRIMETH/SULFA <=20 SENSITIVE Sensitive     AMPICILLIN/SULBACTAM <=2 SENSITIVE Sensitive     PIP/TAZO <=4 SENSITIVE Sensitive     * PROTEUS MIRABILIS  Blood Culture ID Panel (Reflexed)     Status: Abnormal   Collection Time: 12/11/19  7:45 PM  Result Value Ref Range Status   Enterococcus species NOT DETECTED  NOT  DETECTED Final   Listeria monocytogenes NOT DETECTED NOT DETECTED Final   Staphylococcus species NOT DETECTED NOT DETECTED Final   Staphylococcus aureus (BCID) NOT DETECTED NOT DETECTED Final   Streptococcus species NOT DETECTED NOT DETECTED Final   Streptococcus agalactiae NOT DETECTED NOT DETECTED Final   Streptococcus pneumoniae NOT DETECTED NOT DETECTED Final   Streptococcus pyogenes NOT DETECTED NOT DETECTED Final   Acinetobacter baumannii NOT DETECTED NOT DETECTED Final   Enterobacteriaceae species DETECTED (A) NOT DETECTED Final    Comment: Enterobacteriaceae represent a large family of gram-negative bacteria, not a single organism. CRITICAL RESULT CALLED TO, READ BACK BY AND VERIFIED WITH: Rayna Sexton AT 1610 ON 12/12/19 SNG    Enterobacter cloacae complex NOT DETECTED NOT DETECTED Final   Escherichia coli NOT DETECTED NOT DETECTED Final   Klebsiella oxytoca NOT DETECTED NOT DETECTED Final   Klebsiella pneumoniae NOT DETECTED NOT DETECTED Final   Proteus species DETECTED (A) NOT DETECTED Final    Comment: CRITICAL RESULT CALLED TO, READ BACK BY AND VERIFIED WITH: Rayna Sexton AT 9604 ON 12/12/19 SNG    Serratia marcescens NOT DETECTED NOT DETECTED Final   Carbapenem resistance NOT DETECTED NOT DETECTED Final   Haemophilus influenzae NOT DETECTED NOT DETECTED Final   Neisseria meningitidis NOT DETECTED NOT DETECTED Final   Pseudomonas aeruginosa NOT DETECTED NOT DETECTED Final   Candida albicans NOT DETECTED NOT DETECTED Final   Candida glabrata NOT DETECTED NOT DETECTED Final   Candida krusei NOT DETECTED NOT DETECTED Final   Candida parapsilosis NOT DETECTED NOT DETECTED Final   Candida tropicalis NOT DETECTED NOT DETECTED Final    Comment: Performed at [facility], [address] [place], [place] [address]  Urine culture     Status: Abnormal   Collection Time: 12/11/19  8:09 PM   Specimen: In/Out Cath Urine  Result Value Ref Range Status   Specimen  Description   Final    IN/OUT CATH URINE Performed at [facility], [address], [place], [place] [address]    Special Requests   Final    NONE Performed at [facility], [address] [place], [place] [address]    Culture MULTIPLE SPECIES PRESENT, SUGGEST RECOLLECTION (A)  Final   Report Status 12/12/2019 FINAL  Final  Respiratory Panel by RT PCR (Flu A&B, Covid) - Nasopharyngeal Swab     Status: None   Collection Time: 12/11/19  9:17 PM   Specimen: Nasopharyngeal Swab  Result Value Ref Range Status   SARS Coronavirus 2 by RT PCR NEGATIVE NEGATIVE Final    Comment: (NOTE) SARS-CoV-2 target nucleic acids are NOT DETECTED. The SARS-CoV-2 RNA is generally detectable in upper respiratoy specimens during the acute phase of infection. The lowest concentration of SARS-CoV-2 viral copies this assay can detect is 131 copies/mL. A negative result does not preclude SARS-Cov-2 infection and should not be used as the sole basis for treatment or other patient management decisions. A negative result may occur with  improper specimen collection/handling, submission of specimen other than nasopharyngeal swab, presence of viral mutation(s) within the areas targeted by this assay, and inadequate number of viral copies (<131 copies/mL). A negative result must be combined with clinical observations, patient history, and epidemiological information. The expected result is Negative. Fact Sheet for Patients:  PinkCheek.be Fact Sheet for Healthcare Providers:  GravelBags.it This test is not yet ap proved or cleared by the [place] FDA and  has been authorized for detection and/or diagnosis of SARS-CoV-2 by FDA  under an Emergency Use Authorization (EUA). This EUA will remain  in effect (meaning this test can be used) for the duration of the COVID-19 declaration under Section 564(b)(1) of the Act, 21 U.S.C. section  360bbb-3(b)(1), unless the authorization is terminated or revoked sooner.    Influenza A by PCR NEGATIVE NEGATIVE Final   Influenza B by PCR NEGATIVE NEGATIVE Final    Comment: (NOTE) The Xpert Xpress SARS-CoV-2/FLU/RSV assay is intended as an aid in  the diagnosis of influenza from Nasopharyngeal swab specimens and  should not be used as a sole basis for treatment. Nasal washings and  aspirates are unacceptable for Xpert Xpress SARS-CoV-2/FLU/RSV  testing. Fact Sheet for Patients: PinkCheek.be Fact Sheet for Healthcare Providers: GravelBags.it This test is not yet approved or cleared by the [place] FDA and  has been authorized for detection and/or diagnosis of SARS-CoV-2 by  FDA under an Emergency Use Authorization (EUA). This EUA will remain  in effect (meaning this test can be used) for the duration of the  Covid-19 declaration under Section 564(b)(1) of the Act, 21  U.S.C. section 360bbb-3(b)(1), unless the authorization is  terminated or revoked. Performed at [facility], [address] [place], [place] [address]   MRSA PCR Screening     Status: None   Collection Time: 12/12/19 12:29 AM   Specimen: Nasopharyngeal  Result Value Ref Range Status   MRSA by PCR NEGATIVE NEGATIVE Final    Comment:        The GeneXpert MRSA Assay (FDA approved for NASAL specimens only), is one component of a comprehensive MRSA colonization surveillance program. It is not intended to diagnose MRSA infection nor to guide or monitor treatment for MRSA infections. Performed at [facility], [address], [place], [place] [address]   Aerobic/Anaerobic Culture (surgical/deep wound)     Status: None   Collection Time: 12/12/19  2:06 AM   Specimen: Sacral; Wound  Result Value Ref Range Status   Specimen Description   Final    SACRAL Performed at [facility], [address], [place], [place]  [address]    Special Requests   Final    NONE Performed at [facility], [place], [place] [address]    Gram Stain NO WBC SEEN NO ORGANISMS SEEN   Final   Culture   Final    FEW ESCHERICHIA COLI FEW CITROBACTER SPECIES FEW PROTEUS MIRABILIS FEW BACTEROIDES OVATUS BETA LACTAMASE POSITIVE Performed at [facility], [place] [address], [place], [place] [address]    Report Status 12/15/2019 FINAL  Final   Organism ID, Bacteria ESCHERICHIA COLI  Final   Organism ID, Bacteria CITROBACTER SPECIES  Final   Organism ID, Bacteria PROTEUS MIRABILIS  Final      Susceptibility   Citrobacter species - MIC*    CEFAZOLIN >=64 RESISTANT Resistant     CEFEPIME <=1 SENSITIVE Sensitive     CEFTAZIDIME >=64 RESISTANT Resistant     CEFTRIAXONE >=64 RESISTANT Resistant     CIPROFLOXACIN <=0.25 SENSITIVE Sensitive     GENTAMICIN <=1 SENSITIVE Sensitive     IMIPENEM <=0.25 SENSITIVE Sensitive     TRIMETH/SULFA <=20 SENSITIVE Sensitive     PIP/TAZO 64 INTERMEDIATE Intermediate     * FEW CITROBACTER SPECIES   Escherichia coli - MIC*    AMPICILLIN 16 INTERMEDIATE Intermediate     CEFAZOLIN >=64 RESISTANT Resistant     CEFEPIME <=1 SENSITIVE Sensitive     CEFTAZIDIME <=1 SENSITIVE Sensitive  CEFTRIAXONE <=1 SENSITIVE Sensitive     CIPROFLOXACIN <=0.25 SENSITIVE Sensitive     GENTAMICIN <=1 SENSITIVE Sensitive     IMIPENEM <=0.25 SENSITIVE Sensitive     TRIMETH/SULFA <=20 SENSITIVE Sensitive     AMPICILLIN/SULBACTAM 4 SENSITIVE Sensitive     PIP/TAZO <=4 SENSITIVE Sensitive     * FEW ESCHERICHIA COLI   Proteus mirabilis - MIC*    AMPICILLIN <=2 SENSITIVE Sensitive     CEFAZOLIN <=4 SENSITIVE Sensitive     CEFEPIME <=1 SENSITIVE Sensitive     CEFTAZIDIME <=1 SENSITIVE Sensitive     CEFTRIAXONE <=1 SENSITIVE Sensitive     CIPROFLOXACIN <=0.25 SENSITIVE Sensitive     GENTAMICIN 2 SENSITIVE Sensitive     IMIPENEM 4 SENSITIVE Sensitive     TRIMETH/SULFA <=20 SENSITIVE  Sensitive     AMPICILLIN/SULBACTAM <=2 SENSITIVE Sensitive     PIP/TAZO <=4 SENSITIVE Sensitive     * FEW PROTEUS MIRABILIS  Aerobic/Anaerobic Culture (surgical/deep wound)     Status: None   Collection Time: 12/12/19  2:06 AM   Specimen: Back; Wound  Result Value Ref Range Status   Specimen Description   Final    BACK LUMBAR SPINE Performed at [facility], [address] [place], [place] [address]    Special Requests   Final    NONE Performed at [facility], [place], [place] [address]    Gram Stain NO WBC SEEN NO ORGANISMS SEEN   Final   Culture   Final    FEW PROTEUS MIRABILIS NO ANAEROBES ISOLATED Performed at [facility], [place] [address], [place], [place] [address]    Report Status 12/17/2019 FINAL  Final   Organism ID, Bacteria PROTEUS MIRABILIS  Final      Susceptibility   Proteus mirabilis - MIC*    AMPICILLIN <=2 SENSITIVE Sensitive     CEFAZOLIN <=4 SENSITIVE Sensitive     CEFEPIME <=1 SENSITIVE Sensitive     CEFTAZIDIME <=1 SENSITIVE Sensitive     CEFTRIAXONE <=1 SENSITIVE Sensitive     CIPROFLOXACIN <=0.25 SENSITIVE Sensitive     GENTAMICIN 2 SENSITIVE Sensitive     IMIPENEM 4 SENSITIVE Sensitive     TRIMETH/SULFA <=20 SENSITIVE Sensitive     AMPICILLIN/SULBACTAM <=2 SENSITIVE Sensitive     PIP/TAZO <=4 SENSITIVE Sensitive     * FEW PROTEUS MIRABILIS  Aerobic/Anaerobic Culture (surgical/deep wound)     Status: None (Preliminary result)   Collection Time: 12/19/19  3:08 PM   Specimen: Abscess  Result Value Ref Range Status   Specimen Description   Final    ABSCESS Performed at [facility], [address], [place], [place] [address]    Special Requests   Final    NONE Performed at [facility], [address] [place], [place] [address]    Gram Stain   Final    ABUNDANT WBC PRESENT, PREDOMINANTLY PMN RARE GRAM POSITIVE RODS RARE GRAM NEGATIVE RODS Performed at [facility],  [place] [address] [place], [place] [address]    Culture   Final    FEW GRAM NEGATIVE RODS MODERATE PROTEUS MIRABILIS CULTURE REINCUBATED FOR BETTER GROWTH NO ANAEROBES ISOLATED; CULTURE IN PROGRESS FOR 5 DAYS    Report Status PENDING  Incomplete    Coagulation Studies: No results for input(s): LABPROT, INR in the last 72 hours.  Urinalysis: No results for input(s): COLORURINE, LABSPEC, PHURINE, GLUCOSEU, HGBUR, BILIRUBINUR, KETONESUR, PROTEINUR, UROBILINOGEN, NITRITE, LEUKOCYTESUR in the last 72  hours.  Invalid input(s): APPERANCEUR    Imaging: EEG  Result Date: 12/20/2019 Lora Havens, MD     12/20/2019  2:52 PM Patient Name: Taylor Tyler MRN: [id number] Epilepsy Attending: Lora Havens Referring Physician/Provider: Dr. Loletha Grayer Date: 12/20/2019 Duration: 23.05 minutes Patient history: 79 year old female with altered mental status.  EEG evaluate for seizures. Level of alertness: Awake, drowsy, sleep, comatose, lethargic AEDs during EEG study: None Technical aspects: This EEG study was done with scalp electrodes positioned according to the 10-20 International system of electrode placement. Electrical activity was acquired at a sampling rate of 500Hz  and reviewed with a high frequency filter of 70Hz  and a low frequency filter of 1Hz . EEG data were recorded continuously and digitally stored. Description: No clear posterior dominant rhythm seen. EEG showed continuous generalized polymorphic 3 to 6 Hz theta-delta slowing, at times with triphasic morphology. Physiology photic driving was not seen during photic stimulation.  Hyperventilation was not performed.   ABNORMALITY -Continuous slow, generalized IMPRESSION: This study is suggestive of moderate diffuse encephalopathy, nonspecific etiology. No seizures or epileptiform discharges were seen throughout the recording. Lora Havens   CT ASPIRATION  Result Date: 12/19/2019 INDICATION: 79 year old with decubitus ulcerations and an  air-fluid collection in the posterior paraspinal tissues of the lumbar spine. Findings are concerning for a paraspinal abscess. Plan for CT-guided aspiration. EXAM: CT-GUIDED ASPIRATION OF POSTERIOR PARASPINAL FLUID COLLECTION MEDICATIONS: None ANESTHESIA/SEDATION: None COMPLICATIONS: None immediate. PROCEDURE: Informed consent was obtained for CT-guided aspiration. Patient was placed prone. CT images through the lumbar spine and pelvis were obtained. The posterior paraspinal air-fluid collection was identified and targeted. Skin was marked right of the upper decubitus ulcer. Skin was prepped with chlorhexidine and sterile field was created. Skin and soft tissues were anesthetized with 1% lidocaine. Using CT guidance, 18 gauge needle was directed into the paraspinal collection. Brown fluid and air was initially aspirated. Needle was slightly repositioned and small amount of serosanguineous fluid was aspirated. No additional fluid could be aspirated. Needle was removed. Follow up CT images were obtained. Bandage placed over the puncture site. FINDINGS: Air-fluid collection in the posterior paraspinal region of the lumbar spine. Needle position was confirmed within the collection and approximately 20 mL of brown fluid and air was aspirated. Following the aspiration, there was still an air-fluid collection in the posterior paraspinal tissue. This collection appears to be complex or loculated. IMPRESSION: CT-guided aspiration of the posterior paraspinal fluid collection. 20 mL of brown fluid was removed and fluid was sent for culture. The collection appears to be complex with gas. The gas may be associated with the adjacent decubitus ulceration. Electronically Signed   By: Markus Daft M.D.   On: 12/19/2019 15:41     Medications:   . sodium chloride Stopped (12/12/19 2140)  . albumin human 25 g (12/20/19 1955)  . feeding supplement (NEPRO CARB STEADY) 1,000 mL (12/21/19 0636)  . meropenem (MERREM) IV 500 mg  (12/20/19 1740)   . chlorhexidine  15 mL Mouth Rinse BID  . Chlorhexidine Gluconate Cloth  6 each Topical Q0600  . epoetin (EPOGEN/PROCRIT) injection  10,000 Units Intravenous Q T,Th,Sa-HD  . feeding supplement (PRO-STAT SUGAR FREE 64)  30 mL Per Tube BID  . heparin injection (subcutaneous)  5,000 Units Subcutaneous Q8H  . levothyroxine  50 mcg Intravenous Daily  . mouth rinse  15 mL Mouth Rinse q12n4p  . pantoprazole (PROTONIX) IV  40 mg Intravenous Q24H  . sodium chloride flush  10-40 mL Intracatheter Q12H  acetaminophen, docusate sodium, HYDROcodone-acetaminophen, ipratropium-albuterol, ipratropium-albuterol, morphine injection, ondansetron (ZOFRAN) IV, polyethylene glycol, sodium chloride flush  Assessment/ Plan:  78 y.o. female  is a 79 y.o. black female with end stage renal disease on hemodialysis, dementia, diabetes mellitus type II, hypertension, hypothyroidism, osteomyelitis, now admitted with decreased responsiveness and sepsis.  [facility] ([place] [address] TTS RIJ [phone].  1.  ESRD on HD TTS.   Dialysis for tomorrow.   2.  Altered mental status: MRI negative. On cefepime. Appreciate neurology input.   3.  Anemia of chronic kidney disease.  Hemoglobin 7.6 - EPO with HD treatment.   4.  Secondary hyperparathyroidism: PTH and phosphorus are low. Corrected calcium at goal.   5. Hypotension: blood pressure stable off any anti-hypertensive agents.  Consider midodrine via tube before next treatment.   Overall prognosis is very poor despite aggressive measures.    LOS: [place] 5/19/202112:09 PM

## 2019-12-21 NOTE — Progress Notes (Addendum)
Progress Note    Taylor Tyler  WCB:[id number] DOB: 03/21/1941  DOA: 12/11/2019 PCP: McLean-Scocuzza, Nino Glow, MD      Brief Narrative:    Medical records reviewed and are as summarized below:  Taylor Tyler is an 79 y.o. female  with a history of end-stage renal disease, hypertension, diabetes, history of lumbar fusion, laminectomy 2014, with revision in 6160 complicated by wound infection due to Proteus   admitted from  facility with fever and altered mental status of 1 day duration. In the ED temperature was 101.2, BP 101/ 44, heart rate of 79 and respiratory rate of 34.  As blood pressure dropped further she was admitted to the ICU and started on pressors.      Assessment/Plan:   Active Problems:   Anemia of chronic disease   Encephalopathy acute   Sacral decubitus ulcer, stage IV (HCC)   Severe sepsis with septic shock (HCC)   Osteomyelitis (HCC)   Goals of care, counseling/discussion   Palliative care by specialist   DNR (do not resuscitate) discussion   Failure to thrive in adult   S/p septic shock/stage IV sacral decubitus wound infection with abscess and underlying osteomyelitis/Proteus and Citrobacter bacteremia S/p debridement of necrotic sacral wound.  S/p CT-guided aspiration of 20 mls posterior paraspinal fluid collection.  Continue empiric IV antibiotics.  Follow-up with ID for further recommendations.  Acute toxic metabolic encephalopathy: Continue supportive care  ESRD: Follow-up with nephrologist for hemodialysis.  Anemia of chronic disease: Continue epoetin.  Monitor H&H.  Hypothyroidism: Continue IV Synthroid.  Unstageable sacral decubitus ulcer and left heel deep tissue injury (present on admission): Continue local wound care per wound nurse's recommendation.  Turn patient every 2 hours.   Body mass index is 32.13 kg/m.  (Obesity)             Pressure Injury 11/19/19 Sacrum Mid;Right Unstageable - Full thickness tissue loss in  which the base of the injury is covered by slough (yellow, tan, gray, green or brown) and/or eschar (tan, brown or black) in the wound bed. deep wound with bed covered  (Active)  11/19/19 1800  Location: Sacrum  Location Orientation: Mid;Right  Staging: Unstageable - Full thickness tissue loss in which the base of the injury is covered by slough (yellow, tan, gray, green or brown) and/or eschar (tan, brown or black) in the wound bed.  Wound Description (Comments): deep wound with bed covered by slough  Present on Admission: Yes     Pressure Injury 11/19/19 Heel Left Deep Tissue Pressure Injury - Purple or maroon localized area of discolored intact skin or blood-filled blister due to damage of underlying soft tissue from pressure and/or shear. (Active)  11/19/19 1800  Location: Heel  Location Orientation: Left  Staging: Deep Tissue Pressure Injury - Purple or maroon localized area of discolored intact skin or blood-filled blister due to damage of underlying soft tissue from pressure and/or shear.  Wound Description (Comments):   Present on Admission: Yes         Family Communication/Anticipated D/C date and plan/Code Status   DVT prophylaxis: Heparin Code Status: Full code Family Communication: None Disposition Plan:    Status is: Inpatient  Remains inpatient appropriate because:Altered mental status, IV treatments appropriate due to intensity of illness or inability to take PO and Inpatient level of care appropriate due to severity of illness   Dispo: The patient is from: SNF  Anticipated d/c is to: SNF              Anticipated d/c date is: > 3 days              Patient currently is not medically stable to d/c.            Subjective:   Patient is nonverbal and unable to provide any history.  Objective:    Vitals:   12/20/19 1635 12/20/19 2312 12/21/19 0500 12/21/19 0731  BP: 136/69 (!) 127/57  131/72  Pulse: 91 98  95  Resp: 16 14  14   Temp:  97.9 F (36.6 C) 98 F (36.7 C)  98.7 F (37.1 C)  TempSrc: Oral Oral    SpO2: 100% 100%  100%  Weight:   90.3 kg   Height:       No data found.  No intake or output data in the 24 hours ending 12/21/19 1421 Filed Weights   12/19/19 0500 12/20/19 0500 12/21/19 0500  Weight: 86.7 kg 89.8 kg 90.3 kg    Exam:  GEN: NAD SKIN: Stage IV sacral decubitus ulcer, deep tissue injury of the left heel (present on admission) EYES: EOMI ENT: MMM CV: RRR PULM: CTA B ABD: soft, ND, NT, +BS CNS: She is awake but she does not follow any commands.  She moves her head from side to side to follow my movements in the room. EXT: Bilateral ankle edema, no tenderness   Data Reviewed:   I have personally reviewed following labs and imaging studies:  Labs: Labs show the following:   Basic Metabolic Panel: Recent Labs  Lab 12/16/19 0343 12/16/19 0900 12/17/19 0447 12/17/19 1131 12/17/19 1131 12/18/19 0709 12/18/19 0709 12/19/19 1036 12/20/19 0630  NA 136  --   --  134*  --  139  --  139 138  K 2.7*   < >  --  3.6   < > 3.0*   < > 3.2* 3.3*  CL 101  --   --  103  --  105  --  106 105  CO2 28  --   --  24  --  27  --  26 25  GLUCOSE 105*  --   --  117*  --  177*  --  116* 137*  BUN 8  --   --  10  --  6*  --  14 20  CREATININE 1.44*  --   --  2.07*  --  1.38*  --  1.90* 2.30*  CALCIUM 7.3*  --   --  7.6*  --  7.5*  --  7.9* 8.1*  MG 1.6*  --  2.0  --   --  1.9  --  1.9 1.9  PHOS 2.1*  --  3.4 3.7  --  1.9*  --  1.9* 2.1*   < > = values in this interval not displayed.   GFR Estimated Creatinine Clearance: 23.6 mL/min (A) (by C-G formula based on SCr of 2.3 mg/dL (H)). Liver Function Tests: Recent Labs  Lab 12/17/19 1131  ALBUMIN 1.7*   No results for input(s): LIPASE, AMYLASE in the last 168 hours. Recent Labs  Lab 12/18/19 0709  AMMONIA 61*   Coagulation profile No results for input(s): INR, PROTIME in the last 168 hours.  CBC: Recent Labs  Lab 12/16/19 0343  12/16/19 0343 12/17/19 0447 12/17/19 1131 12/18/19 0709 12/19/19 1036 12/20/19 0630  WBC 10.5   < > 9.7 13.2* 11.7*  14.6* 12.7*  NEUTROABS 7.9*  --  6.6  --  8.0* 10.4* 8.8*  HGB 7.1*   < > 7.2* 7.4* 7.3* 7.3* 7.6*  HCT 22.5*   < > 23.9* 24.7* 24.8* 24.1* 25.8*  MCV 84.6   < > 88.5 89.2 91.2 88.3 91.8  PLT 77*   < > 112* 133* 162 170 215   < > = values in this interval not displayed.   Cardiac Enzymes: No results for input(s): CKTOTAL, CKMB, CKMBINDEX, TROPONINI in the last 168 hours. BNP (last 3 results) No results for input(s): PROBNP in the last 8760 hours. CBG: Recent Labs  Lab 12/20/19 2033 12/20/19 2317 12/21/19 0352 12/21/19 0737 12/21/19 1154  GLUCAP 154* 158* 149* 169* 187*   D-Dimer: No results for input(s): DDIMER in the last 72 hours. Hgb A1c: No results for input(s): HGBA1C in the last 72 hours. Lipid Profile: No results for input(s): CHOL, HDL, LDLCALC, TRIG, CHOLHDL, LDLDIRECT in the last 72 hours. Thyroid function studies: Recent Labs    12/19/19 1036  TSH 6.652*   Anemia work up: No results for input(s): VITAMINB12, FOLATE, FERRITIN, TIBC, IRON, RETICCTPCT in the last 72 hours. Sepsis Labs: Recent Labs  Lab 12/17/19 1131 12/18/19 0709 12/19/19 1036 12/20/19 0630  WBC 13.2* 11.7* 14.6* 12.7*    Microbiology Recent Results (from the past 240 hour(s))  Blood Culture (routine x 2)     Status: Abnormal   Collection Time: 12/11/19  7:45 PM   Specimen: BLOOD  Result Value Ref Range Status   Specimen Description   Final    BLOOD RIGHT FA Performed at [facility], [address] [place], [place] [address]    Special Requests   Final    BOTTLES DRAWN AEROBIC AND ANAEROBIC Blood Culture adequate volume Performed at [facility], [address] [place], [place] [address]    Culture  Setup Time   Final    GRAM NEGATIVE RODS IN BOTH AEROBIC AND ANAEROBIC BOTTLES CRITICAL RESULT CALLED TO, READ BACK BY AND VERIFIED WITH:  Rayna Sexton AT 5361 ON 12/12/19 SNG    Culture (A)  Final    PROTEUS MIRABILIS CITROBACTER YOUNGAE SUSCEPTIBILITIES PERFORMED ON PREVIOUS CULTURE WITHIN THE LAST 5 DAYS. Performed at [facility], [place] [address] [place], [place] [address]    Report Status 12/16/2019 FINAL  Final  Blood Culture (routine x 2)     Status: Abnormal   Collection Time: 12/11/19  7:45 PM   Specimen: BLOOD  Result Value Ref Range Status   Specimen Description   Final    BLOOD RIGHT ARM Performed at [facility], [address] [place], [place] [address]    Special Requests   Final    BOTTLES DRAWN AEROBIC AND ANAEROBIC Blood Culture adequate volume Performed at [facility], [address], [place], [place] [address]    Culture  Setup Time   Final    GRAM NEGATIVE RODS IN BOTH AEROBIC AND ANAEROBIC BOTTLES CRITICAL VALUE NOTED.  VALUE IS CONSISTENT WITH PREVIOUSLY REPORTED AND CALLED VALUE. Performed at [facility], [address] [place],  [address]    Culture PROTEUS MIRABILIS Su Hoff  (A)  Final   Report Status 12/16/2019 FINAL  Final   Organism ID, Bacteria PROTEUS MIRABILIS  Final   Organism ID, Bacteria CITROBACTER YOUNGAE  Final      Susceptibility   Citrobacter youngae - MIC*    CEFAZOLIN >=64 RESISTANT Resistant  CEFEPIME <=1 SENSITIVE Sensitive     CEFTAZIDIME >=64 RESISTANT Resistant     CEFTRIAXONE >=64 RESISTANT Resistant     CIPROFLOXACIN <=0.25 SENSITIVE Sensitive     GENTAMICIN <=1 SENSITIVE Sensitive     IMIPENEM 1 SENSITIVE Sensitive     TRIMETH/SULFA <=20 SENSITIVE Sensitive     PIP/TAZO 64 INTERMEDIATE Intermediate     * CITROBACTER YOUNGAE   Proteus mirabilis - MIC*    AMPICILLIN <=2 SENSITIVE Sensitive     CEFAZOLIN <=4 SENSITIVE Sensitive     CEFEPIME <=1 SENSITIVE Sensitive     CEFTAZIDIME <=1 SENSITIVE Sensitive     CEFTRIAXONE <=1 SENSITIVE Sensitive     CIPROFLOXACIN <=0.25 SENSITIVE Sensitive      GENTAMICIN <=1 SENSITIVE Sensitive     IMIPENEM 4 SENSITIVE Sensitive     TRIMETH/SULFA <=20 SENSITIVE Sensitive     AMPICILLIN/SULBACTAM <=2 SENSITIVE Sensitive     PIP/TAZO <=4 SENSITIVE Sensitive     * PROTEUS MIRABILIS  Blood Culture ID Panel (Reflexed)     Status: Abnormal   Collection Time: 12/11/19  7:45 PM  Result Value Ref Range Status   Enterococcus species NOT DETECTED NOT DETECTED Final   Listeria monocytogenes NOT DETECTED NOT DETECTED Final   Staphylococcus species NOT DETECTED NOT DETECTED Final   Staphylococcus aureus (BCID) NOT DETECTED NOT DETECTED Final   Streptococcus species NOT DETECTED NOT DETECTED Final   Streptococcus agalactiae NOT DETECTED NOT DETECTED Final   Streptococcus pneumoniae NOT DETECTED NOT DETECTED Final   Streptococcus pyogenes NOT DETECTED NOT DETECTED Final   Acinetobacter baumannii NOT DETECTED NOT DETECTED Final   Enterobacteriaceae species DETECTED (A) NOT DETECTED Final    Comment: Enterobacteriaceae represent a large family of gram-negative bacteria, not a single organism. CRITICAL RESULT CALLED TO, READ BACK BY AND VERIFIED WITH: Rayna Sexton AT 7782 ON 12/12/19 SNG    Enterobacter cloacae complex NOT DETECTED NOT DETECTED Final   Escherichia coli NOT DETECTED NOT DETECTED Final   Klebsiella oxytoca NOT DETECTED NOT DETECTED Final   Klebsiella pneumoniae NOT DETECTED NOT DETECTED Final   Proteus species DETECTED (A) NOT DETECTED Final    Comment: CRITICAL RESULT CALLED TO, READ BACK BY AND VERIFIED WITH: Rayna Sexton AT 4235 ON 12/12/19 SNG    Serratia marcescens NOT DETECTED NOT DETECTED Final   Carbapenem resistance NOT DETECTED NOT DETECTED Final   Haemophilus influenzae NOT DETECTED NOT DETECTED Final   Neisseria meningitidis NOT DETECTED NOT DETECTED Final   Pseudomonas aeruginosa NOT DETECTED NOT DETECTED Final   Candida albicans NOT DETECTED NOT DETECTED Final   Candida glabrata NOT DETECTED NOT DETECTED Final   Candida krusei  NOT DETECTED NOT DETECTED Final   Candida parapsilosis NOT DETECTED NOT DETECTED Final   Candida tropicalis NOT DETECTED NOT DETECTED Final    Comment: Performed at [facility], [address] [place], [place] [address]  Urine culture     Status: Abnormal   Collection Time: 12/11/19  8:09 PM   Specimen: In/Out Cath Urine  Result Value Ref Range Status   Specimen Description   Final    IN/OUT CATH URINE Performed at [facility], [address], [place], [place] [address]    Special Requests   Final    NONE Performed at [facility], [address] [place], [place] [address]    Culture MULTIPLE SPECIES PRESENT, SUGGEST RECOLLECTION (A)  Final   Report Status 12/12/2019 FINAL  Final  Respiratory Panel by RT PCR (Flu A&B,  Covid) - Nasopharyngeal Swab     Status: None   Collection Time: 12/11/19  9:17 PM   Specimen: Nasopharyngeal Swab  Result Value Ref Range Status   SARS Coronavirus 2 by RT PCR NEGATIVE NEGATIVE Final    Comment: (NOTE) SARS-CoV-2 target nucleic acids are NOT DETECTED. The SARS-CoV-2 RNA is generally detectable in upper respiratoy specimens during the acute phase of infection. The lowest concentration of SARS-CoV-2 viral copies this assay can detect is 131 copies/mL. A negative result does not preclude SARS-Cov-2 infection and should not be used as the sole basis for treatment or other patient management decisions. A negative result may occur with  improper specimen collection/handling, submission of specimen other than nasopharyngeal swab, presence of viral mutation(s) within the areas targeted by this assay, and inadequate number of viral copies (<131 copies/mL). A negative result must be combined with clinical observations, patient history, and epidemiological information. The expected result is Negative. Fact Sheet for Patients:  PinkCheek.be Fact Sheet for Healthcare Providers:    GravelBags.it This test is not yet ap proved or cleared by the [place] FDA and  has been authorized for detection and/or diagnosis of SARS-CoV-2 by FDA under an Emergency Use Authorization (EUA). This EUA will remain  in effect (meaning this test can be used) for the duration of the COVID-19 declaration under Section 564(b)(1) of the Act, 21 U.S.C. section 360bbb-3(b)(1), unless the authorization is terminated or revoked sooner.    Influenza A by PCR NEGATIVE NEGATIVE Final   Influenza B by PCR NEGATIVE NEGATIVE Final    Comment: (NOTE) The Xpert Xpress SARS-CoV-2/FLU/RSV assay is intended as an aid in  the diagnosis of influenza from Nasopharyngeal swab specimens and  should not be used as a sole basis for treatment. Nasal washings and  aspirates are unacceptable for Xpert Xpress SARS-CoV-2/FLU/RSV  testing. Fact Sheet for Patients: PinkCheek.be Fact Sheet for Healthcare Providers: GravelBags.it This test is not yet approved or cleared by the [place] FDA and  has been authorized for detection and/or diagnosis of SARS-CoV-2 by  FDA under an Emergency Use Authorization (EUA). This EUA will remain  in effect (meaning this test can be used) for the duration of the  Covid-19 declaration under Section 564(b)(1) of the Act, 21  U.S.C. section 360bbb-3(b)(1), unless the authorization is  terminated or revoked. Performed at [facility], [address] [place], [place] [address]   MRSA PCR Screening     Status: None   Collection Time: 12/12/19 12:29 AM   Specimen: Nasopharyngeal  Result Value Ref Range Status   MRSA by PCR NEGATIVE NEGATIVE Final    Comment:        The GeneXpert MRSA Assay (FDA approved for NASAL specimens only), is one component of a comprehensive MRSA colonization surveillance program. It is not intended to diagnose MRSA infection nor to guide or monitor  treatment for MRSA infections. Performed at [facility], [address] [place], [place] [address]   Aerobic/Anaerobic Culture (surgical/deep wound)     Status: None   Collection Time: 12/12/19  2:06 AM   Specimen: Sacral; Wound  Result Value Ref Range Status   Specimen Description   Final    SACRAL Performed at [facility], [address] [place], [place] [address]    Special Requests   Final    NONE Performed at [facility], [place], [place] [address]    Gram Stain NO WBC SEEN NO ORGANISMS SEEN  Final   Culture   Final    FEW ESCHERICHIA COLI FEW CITROBACTER SPECIES FEW PROTEUS MIRABILIS FEW BACTEROIDES OVATUS BETA LACTAMASE POSITIVE Performed at [facility], [place] [address] [place], [place] [address]    Report Status 12/15/2019 FINAL  Final   Organism ID, Bacteria ESCHERICHIA COLI  Final   Organism ID, Bacteria CITROBACTER SPECIES  Final   Organism ID, Bacteria PROTEUS MIRABILIS  Final      Susceptibility   Citrobacter species - MIC*    CEFAZOLIN >=64 RESISTANT Resistant     CEFEPIME <=1 SENSITIVE Sensitive     CEFTAZIDIME >=64 RESISTANT Resistant     CEFTRIAXONE >=64 RESISTANT Resistant     CIPROFLOXACIN <=0.25 SENSITIVE Sensitive     GENTAMICIN <=1 SENSITIVE Sensitive     IMIPENEM <=0.25 SENSITIVE Sensitive     TRIMETH/SULFA <=20 SENSITIVE Sensitive     PIP/TAZO 64 INTERMEDIATE Intermediate     * FEW CITROBACTER SPECIES   Escherichia coli - MIC*    AMPICILLIN 16 INTERMEDIATE Intermediate     CEFAZOLIN >=64 RESISTANT Resistant     CEFEPIME <=1 SENSITIVE Sensitive     CEFTAZIDIME <=1 SENSITIVE Sensitive     CEFTRIAXONE <=1 SENSITIVE Sensitive     CIPROFLOXACIN <=0.25 SENSITIVE Sensitive     GENTAMICIN <=1 SENSITIVE Sensitive     IMIPENEM <=0.25 SENSITIVE Sensitive     TRIMETH/SULFA <=20 SENSITIVE Sensitive     AMPICILLIN/SULBACTAM 4 SENSITIVE Sensitive     PIP/TAZO <=4 SENSITIVE Sensitive     * FEW  ESCHERICHIA COLI   Proteus mirabilis - MIC*    AMPICILLIN <=2 SENSITIVE Sensitive     CEFAZOLIN <=4 SENSITIVE Sensitive     CEFEPIME <=1 SENSITIVE Sensitive     CEFTAZIDIME <=1 SENSITIVE Sensitive     CEFTRIAXONE <=1 SENSITIVE Sensitive     CIPROFLOXACIN <=0.25 SENSITIVE Sensitive     GENTAMICIN 2 SENSITIVE Sensitive     IMIPENEM 4 SENSITIVE Sensitive     TRIMETH/SULFA <=20 SENSITIVE Sensitive     AMPICILLIN/SULBACTAM <=2 SENSITIVE Sensitive     PIP/TAZO <=4 SENSITIVE Sensitive     * FEW PROTEUS MIRABILIS  Aerobic/Anaerobic Culture (surgical/deep wound)     Status: None   Collection Time: 12/12/19  2:06 AM   Specimen: Back; Wound  Result Value Ref Range Status   Specimen Description   Final    BACK LUMBAR SPINE Performed at [facility], [address] [place], [place] [address]    Special Requests   Final    NONE Performed at [facility], [place], [place] [address]    Gram Stain NO WBC SEEN NO ORGANISMS SEEN   Final   Culture   Final    FEW PROTEUS MIRABILIS NO ANAEROBES ISOLATED Performed at [facility], [place] [address] [place],  [address]    Report Status 12/17/2019 FINAL  Final   Organism ID, Bacteria PROTEUS MIRABILIS  Final      Susceptibility   Proteus mirabilis - MIC*    AMPICILLIN <=2 SENSITIVE Sensitive     CEFAZOLIN <=4 SENSITIVE Sensitive     CEFEPIME <=1 SENSITIVE Sensitive     CEFTAZIDIME <=1 SENSITIVE Sensitive     CEFTRIAXONE <=1 SENSITIVE Sensitive     CIPROFLOXACIN <=0.25 SENSITIVE Sensitive     GENTAMICIN 2 SENSITIVE Sensitive     IMIPENEM 4 SENSITIVE Sensitive     TRIMETH/SULFA <=20 SENSITIVE Sensitive     AMPICILLIN/SULBACTAM <=2 SENSITIVE Sensitive  PIP/TAZO <=4 SENSITIVE Sensitive     * FEW PROTEUS MIRABILIS  Aerobic/Anaerobic Culture (surgical/deep wound)     Status: None (Preliminary result)   Collection Time: 12/19/19  3:08 PM   Specimen: Abscess  Result Value Ref Range Status    Specimen Description   Final    ABSCESS Performed at [facility], [address] [place], [place] [address]    Special Requests   Final    NONE Performed at [facility], [address] [place], [place] [address]    Gram Stain   Final    ABUNDANT WBC PRESENT, PREDOMINANTLY PMN RARE GRAM POSITIVE RODS RARE GRAM NEGATIVE RODS Performed at [facility], [place] [address], [place], [place] [address]    Culture   Final    FEW GRAM NEGATIVE RODS MODERATE PROTEUS MIRABILIS CULTURE REINCUBATED FOR BETTER GROWTH NO ANAEROBES ISOLATED; CULTURE IN PROGRESS FOR 5 DAYS    Report Status PENDING  Incomplete    Procedures and diagnostic studies:  EEG  Result Date: 12/20/2019 Lora Havens, MD     12/20/2019  2:52 PM Patient Name: IDABELLE MCPETERS MRN: [id number] Epilepsy Attending: Lora Havens Referring Physician/Provider: Dr. Loletha Grayer Date: 12/20/2019 Duration: 23.05 minutes Patient history: 79 year old female with altered mental status.  EEG evaluate for seizures. Level of alertness: Awake, drowsy, sleep, comatose, lethargic AEDs during EEG study: None Technical aspects: This EEG study was done with scalp electrodes positioned according to the 10-20 International system of electrode placement. Electrical activity was acquired at a sampling rate of 500Hz  and reviewed with a high frequency filter of 70Hz  and a low frequency filter of 1Hz . EEG data were recorded continuously and digitally stored. Description: No clear posterior dominant rhythm seen. EEG showed continuous generalized polymorphic 3 to 6 Hz theta-delta slowing, at times with triphasic morphology. Physiology photic driving was not seen during photic stimulation.  Hyperventilation was not performed.   ABNORMALITY -Continuous slow, generalized IMPRESSION: This study is suggestive of moderate diffuse encephalopathy, nonspecific etiology. No seizures or epileptiform discharges were seen throughout the recording.  Lora Havens   CT ASPIRATION  Result Date: 12/19/2019 INDICATION: 79 year old with decubitus ulcerations and an air-fluid collection in the posterior paraspinal tissues of the lumbar spine. Findings are concerning for a paraspinal abscess. Plan for CT-guided aspiration. EXAM: CT-GUIDED ASPIRATION OF POSTERIOR PARASPINAL FLUID COLLECTION MEDICATIONS: None ANESTHESIA/SEDATION: None COMPLICATIONS: None immediate. PROCEDURE: Informed consent was obtained for CT-guided aspiration. Patient was placed prone. CT images through the lumbar spine and pelvis were obtained. The posterior paraspinal air-fluid collection was identified and targeted. Skin was marked right of the upper decubitus ulcer. Skin was prepped with chlorhexidine and sterile field was created. Skin and soft tissues were anesthetized with 1% lidocaine. Using CT guidance, 18 gauge needle was directed into the paraspinal collection. Brown fluid and air was initially aspirated. Needle was slightly repositioned and small amount of serosanguineous fluid was aspirated. No additional fluid could be aspirated. Needle was removed. Follow up CT images were obtained. Bandage placed over the puncture site. FINDINGS: Air-fluid collection in the posterior paraspinal region of the lumbar spine. Needle position was confirmed within the collection and approximately 20 mL of brown fluid and air was aspirated. Following the aspiration, there was still an air-fluid collection in the posterior paraspinal tissue. This collection appears to be complex or loculated. IMPRESSION: CT-guided aspiration of the posterior paraspinal fluid collection. 20 mL of brown fluid was removed and fluid was sent for culture. The  collection appears to be complex with gas. The gas may be associated with the adjacent decubitus ulceration. Electronically Signed   By: Markus Daft M.D.   On: 12/19/2019 15:41    Medications:   . chlorhexidine  15 mL Mouth Rinse BID  . Chlorhexidine Gluconate  Cloth  6 each Topical Q0600  . epoetin (EPOGEN/PROCRIT) injection  10,000 Units Intravenous Q T,Th,Sa-HD  . feeding supplement (PRO-STAT SUGAR FREE 64)  30 mL Per Tube BID  . heparin injection (subcutaneous)  5,000 Units Subcutaneous Q8H  . levothyroxine  50 mcg Intravenous Daily  . mouth rinse  15 mL Mouth Rinse q12n4p  . pantoprazole (PROTONIX) IV  40 mg Intravenous Q24H  . sodium chloride flush  10-40 mL Intracatheter Q12H   Continuous Infusions: . sodium chloride Stopped (12/12/19 2140)  . albumin human 25 g (12/20/19 1955)  . feeding supplement (NEPRO CARB STEADY) 1,000 mL (12/21/19 0636)  . meropenem (MERREM) IV 500 mg (12/20/19 1740)     LOS: 10 days   Patrece Tallie  [facility]     12/21/2019, 2:21 PM

## 2019-12-21 NOTE — Progress Notes (Signed)
PT Cancellation Note  Patient Details Name: Taylor Tyler MRN: [id number] DOB: 1940/09/03   Cancelled Treatment:    Reason Eval/Treat Not Completed: Other (comment). Consult received and chart reviewed. Pt lethargic unable to participate. Deep sternal rub with slight little response. Lifted R arm, quickly falls down. L UE able to stay up, however appears more reflexive then intentional. Doesn't arouse to cues. Will re-attempt another date.   Kaylinn Dedic 12/21/2019, 2:39 PM  Greggory Stallion, PT, DPT [phone]

## 2019-12-21 NOTE — Plan of Care (Signed)
  Problem: Nutrition: Goal: Adequate nutrition will be maintained Outcome: Progressing   Problem: Coping: Goal: Level of anxiety will decrease Outcome: Progressing   Pt responds to voice, unable to vocalize needs at this time Problem: Education: Goal: Knowledge of General Education information will improve Description: Including pain rating scale, medication(s)/side effects and non-pharmacologic comfort measures Outcome: Not Progressing   Problem: Health Behavior/Discharge Planning: Goal: Ability to manage health-related needs will improve Outcome: Not Progressing

## 2019-12-22 LAB — RENAL FUNCTION PANEL
Albumin: 1.6 g/dL — ABNORMAL LOW (ref 3.5–5.0)
Anion gap: 8 (ref 5–15)
BUN: 36 mg/dL — ABNORMAL HIGH (ref 8–23)
CO2: 27 mmol/L (ref 22–32)
Calcium: 7.8 mg/dL — ABNORMAL LOW (ref 8.9–10.3)
Chloride: 102 mmol/L (ref 98–111)
Creatinine, Ser: 2.27 mg/dL — ABNORMAL HIGH (ref 0.44–1.00)
GFR calc Af Amer: 24 mL/min — ABNORMAL LOW (ref >60)
GFR calc non Af Amer: 20 mL/min — ABNORMAL LOW (ref >60)
Glucose, Bld: 190 mg/dL — ABNORMAL HIGH (ref 70–99)
Phosphorus: <1 mg/dL — CL (ref 2.5–4.6)
Potassium: 2.7 mmol/L — CL (ref 3.5–5.1)
Sodium: 137 mmol/L (ref 135–145)

## 2019-12-22 LAB — GLUCOSE, CAPILLARY
Glucose-Capillary: 190 mg/dL — ABNORMAL HIGH (ref 70–99)
Glucose-Capillary: 155 mg/dL — ABNORMAL HIGH (ref 70–99)
Glucose-Capillary: 166 mg/dL — ABNORMAL HIGH (ref 70–99)
Glucose-Capillary: 149 mg/dL — ABNORMAL HIGH (ref 70–99)

## 2019-12-22 LAB — CBC
HCT: 24.5 % — ABNORMAL LOW (ref 36.0–46.0)
Hemoglobin: 6.8 g/dL — ABNORMAL LOW (ref 12.0–15.0)
MCH: 26.7 pg (ref 26.0–34.0)
MCHC: 27.8 g/dL — ABNORMAL LOW (ref 30.0–36.0)
MCV: 96.1 fL (ref 80.0–100.0)
Platelets: 196 10*3/uL (ref 150–400)
RBC: 2.55 MIL/uL — ABNORMAL LOW (ref 3.87–5.11)
RDW: 23.6 % — ABNORMAL HIGH (ref 11.5–15.5)
WBC: 11.9 10*3/uL — ABNORMAL HIGH (ref 4.0–10.5)
nRBC: 0.3 % — ABNORMAL HIGH (ref 0.0–0.2)

## 2019-12-22 MED ORDER — MIDODRINE HCL 5 MG PO TABS
5.0000 mg | ORAL_TABLET | Freq: Three times a day (TID) | ORAL | Status: DC
  Administered 2019-12-22 – 2019-12-30 (×21): 5 mg
  Filled 2019-12-22 (×23): qty 1

## 2019-12-22 MED ORDER — JEVITY 1.5 CAL/FIBER PO LIQD
1000.0000 mL | ORAL | Status: DC
  Administered 2019-12-22 – 2019-12-27 (×5): 1000 mL

## 2019-12-22 MED ORDER — K PHOS MONO-SOD PHOS DI & MONO 155-852-130 MG PO TABS
500.0000 mg | ORAL_TABLET | Freq: Two times a day (BID) | ORAL | Status: DC
  Administered 2019-12-22 – 2019-12-30 (×14): 500 mg
  Filled 2019-12-22 (×19): qty 2

## 2019-12-22 NOTE — Care Management Important Message (Signed)
Important Message  Patient Details  Name: Taylor Tyler MRN: [id number] Date of Birth: 1940/08/13   Medicare Important Message Given:  Yes     Juliann Pulse A Nancylee Gaines 12/22/2019, 11:16 AM

## 2019-12-22 NOTE — Progress Notes (Signed)
[facility]  ROUNDING NOTE   Subjective:   Seen and examined on hemodialysis treatment. K 2.7 - changed to a 4K bath.    HEMODIALYSIS FLOWSHEET:  Blood Flow Rate (mL/min): 400 mL/min Arterial Pressure (mmHg): -180 mmHg Venous Pressure (mmHg): 140 mmHg Transmembrane Pressure (mmHg): 40 mmHg Ultrafiltration Rate (mL/min): 0 mL/min Dialysate Flow Rate (mL/min): 600 ml/min Conductivity: Machine : 14.1 Conductivity: Machine : 14.1 Dialysis Fluid Bolus: Normal Saline Bolus Amount (mL): 250 mL Dialysate Change: 4K    Objective:  Vital signs in last 24 hours:  Temp:  [98.1 F (36.7 C)-99.5 F (37.5 C)] 98.1 F (36.7 C) (05/20 0809) Pulse Rate:  [40-136] 114 (05/20 1026) Resp:  [9-26] 22 (05/20 1026) BP: (85-161)/(58-86) 118/64 (05/20 1026) SpO2:  [98 %-100 %] 100 % (05/20 1026) Weight:  [98.3 kg] 98.3 kg (05/20 0803)  Weight change: 8 kg Filed Weights   12/21/19 0500 12/22/19 0444 12/22/19 0803  Weight: 90.3 kg 98.3 kg 98.3 kg    Intake/Output: I/O last 3 completed shifts: In: 1315 [NG/GT:1215; IV Piggyback:100] Out: 0    Intake/Output this shift:  No intake/output data recorded.  Physical Exam: General: ill-appearing  Head: +NGT  Eyes: Anicteric  Neck: Supple, trachea midline  Lungs:  Clear to auscultation, normal effort  Heart: regular  Abdomen:  + ventral hernia  Extremities: 1+ peripheral edema.  Neurologic: Lethargic, opens eyes to verbal stimuli. No tracking  Skin: Warm/dry, sacral wound not examined (pictures reviewed)  Access: Right IJ PermCath    Basic Metabolic Panel: Recent Labs  Lab 12/16/19 0343 12/16/19 0343 12/16/19 0900 12/17/19 0447 12/17/19 1131 12/17/19 1131 12/18/19 0709 12/18/19 0709 12/19/19 1036 12/20/19 0630 12/22/19 0741  NA 136   < >  --   --  134*  --  139  --  139 138 137  K 2.7*   < >   < >  --  3.6  --  3.0*  --  3.2* 3.3* 2.7*  CL 101   < >  --   --  103  --  105  --  106 105 102  CO2 28   < >  --    --  24  --  27  --  26 25 27   GLUCOSE 105*   < >  --   --  117*  --  177*  --  116* 137* 190*  BUN 8   < >  --   --  10  --  6*  --  14 20 36*  CREATININE 1.44*   < >  --   --  2.07*  --  1.38*  --  1.90* 2.30* 2.27*  CALCIUM 7.3*   < >  --   --  7.6*   < > 7.5*   < > 7.9* 8.1* 7.8*  MG 1.6*  --   --  2.0  --   --  1.9  --  1.9 1.9  --   PHOS 2.1*   < >  --  3.4 3.7  --  1.9*  --  1.9* 2.1* <1.0*   < > = values in this interval not displayed.    Liver Function Tests: Recent Labs  Lab 12/17/19 1131 12/22/19 0741  ALBUMIN 1.7* 1.6*   No results for input(s): LIPASE, AMYLASE in the last 168 hours. Recent Labs  Lab 12/18/19 0709  AMMONIA 61*    CBC: Recent Labs  Lab 12/16/19 0343 12/16/19 0343 12/17/19 0447 12/17/19 0447  12/17/19 1131 12/18/19 0709 12/19/19 1036 12/20/19 0630 12/22/19 0741  WBC 10.5   < > 9.7   < > 13.2* 11.7* 14.6* 12.7* 11.9*  NEUTROABS 7.9*  --  6.6  --   --  8.0* 10.4* 8.8*  --   HGB 7.1*   < > 7.2*   < > 7.4* 7.3* 7.3* 7.6* 6.8*  HCT 22.5*   < > 23.9*   < > 24.7* 24.8* 24.1* 25.8* 24.5*  MCV 84.6   < > 88.5   < > 89.2 91.2 88.3 91.8 96.1  PLT 77*   < > 112*   < > 133* 162 170 215 196   < > = values in this interval not displayed.    Cardiac Enzymes: No results for input(s): CKTOTAL, CKMB, CKMBINDEX, TROPONINI in the last 168 hours.  BNP: Invalid input(s): POCBNP  CBG: Recent Labs  Lab 12/21/19 1154 12/21/19 1643 12/21/19 2008 12/21/19 2337 12/22/19 0431  GLUCAP 187* 175* 168* 177* 190*    Microbiology: Results for orders placed or performed during the hospital encounter of 12/11/19  Blood Culture (routine x 2)     Status: Abnormal   Collection Time: 12/11/19  7:45 PM   Specimen: BLOOD  Result Value Ref Range Status   Specimen Description   Final    BLOOD RIGHT FA Performed at [facility], [address], [place], [place] [address]    Special Requests   Final    BOTTLES DRAWN AEROBIC AND ANAEROBIC Blood Culture  adequate volume Performed at [facility], [address] [place], [place] [address]    Culture  Setup Time   Final    GRAM NEGATIVE RODS IN BOTH AEROBIC AND ANAEROBIC BOTTLES CRITICAL RESULT CALLED TO, READ BACK BY AND VERIFIED WITH: Rayna Sexton AT 7035 ON 12/12/19 SNG    Culture (A)  Final    PROTEUS MIRABILIS CITROBACTER YOUNGAE SUSCEPTIBILITIES PERFORMED ON PREVIOUS CULTURE WITHIN THE LAST 5 DAYS. Performed at [facility], [place] [address], [place], [place] [address]    Report Status 12/16/2019 FINAL  Final  Blood Culture (routine x 2)     Status: Abnormal   Collection Time: 12/11/19  7:45 PM   Specimen: BLOOD  Result Value Ref Range Status   Specimen Description   Final    BLOOD RIGHT ARM Performed at [facility], [address] [place], [place] [address]    Special Requests   Final    BOTTLES DRAWN AEROBIC AND ANAEROBIC Blood Culture adequate volume Performed at [facility], [address], [place], [place] [address]    Culture  Setup Time   Final    GRAM NEGATIVE RODS IN BOTH AEROBIC AND ANAEROBIC BOTTLES CRITICAL VALUE NOTED.  VALUE IS CONSISTENT WITH PREVIOUSLY REPORTED AND CALLED VALUE. Performed at [facility], [address] [place], [place] [address]    Culture PROTEUS MIRABILIS Su Hoff  (A)  Final   Report Status 12/16/2019 FINAL  Final   Organism ID, Bacteria PROTEUS MIRABILIS  Final   Organism ID, Bacteria CITROBACTER YOUNGAE  Final      Susceptibility   Citrobacter youngae - MIC*    CEFAZOLIN >=64 RESISTANT Resistant     CEFEPIME <=1 SENSITIVE Sensitive     CEFTAZIDIME >=64 RESISTANT Resistant     CEFTRIAXONE >=64 RESISTANT Resistant     CIPROFLOXACIN <=0.25 SENSITIVE Sensitive     GENTAMICIN <=1 SENSITIVE Sensitive     IMIPENEM 1 SENSITIVE Sensitive  TRIMETH/SULFA <=20 SENSITIVE Sensitive     PIP/TAZO 64 INTERMEDIATE Intermediate     * CITROBACTER YOUNGAE   Proteus mirabilis - MIC*     AMPICILLIN <=2 SENSITIVE Sensitive     CEFAZOLIN <=4 SENSITIVE Sensitive     CEFEPIME <=1 SENSITIVE Sensitive     CEFTAZIDIME <=1 SENSITIVE Sensitive     CEFTRIAXONE <=1 SENSITIVE Sensitive     CIPROFLOXACIN <=0.25 SENSITIVE Sensitive     GENTAMICIN <=1 SENSITIVE Sensitive     IMIPENEM 4 SENSITIVE Sensitive     TRIMETH/SULFA <=20 SENSITIVE Sensitive     AMPICILLIN/SULBACTAM <=2 SENSITIVE Sensitive     PIP/TAZO <=4 SENSITIVE Sensitive     * PROTEUS MIRABILIS  Blood Culture ID Panel (Reflexed)     Status: Abnormal   Collection Time: 12/11/19  7:45 PM  Result Value Ref Range Status   Enterococcus species NOT DETECTED NOT DETECTED Final   Listeria monocytogenes NOT DETECTED NOT DETECTED Final   Staphylococcus species NOT DETECTED NOT DETECTED Final   Staphylococcus aureus (BCID) NOT DETECTED NOT DETECTED Final   Streptococcus species NOT DETECTED NOT DETECTED Final   Streptococcus agalactiae NOT DETECTED NOT DETECTED Final   Streptococcus pneumoniae NOT DETECTED NOT DETECTED Final   Streptococcus pyogenes NOT DETECTED NOT DETECTED Final   Acinetobacter baumannii NOT DETECTED NOT DETECTED Final   Enterobacteriaceae species DETECTED (A) NOT DETECTED Final    Comment: Enterobacteriaceae represent a large family of gram-negative bacteria, not a single organism. CRITICAL RESULT CALLED TO, READ BACK BY AND VERIFIED WITH: Rayna Sexton AT 4098 ON 12/12/19 SNG    Enterobacter cloacae complex NOT DETECTED NOT DETECTED Final   Escherichia coli NOT DETECTED NOT DETECTED Final   Klebsiella oxytoca NOT DETECTED NOT DETECTED Final   Klebsiella pneumoniae NOT DETECTED NOT DETECTED Final   Proteus species DETECTED (A) NOT DETECTED Final    Comment: CRITICAL RESULT CALLED TO, READ BACK BY AND VERIFIED WITH: Rayna Sexton AT 1191 ON 12/12/19 SNG    Serratia marcescens NOT DETECTED NOT DETECTED Final   Carbapenem resistance NOT DETECTED NOT DETECTED Final   Haemophilus influenzae NOT DETECTED NOT DETECTED  Final   Neisseria meningitidis NOT DETECTED NOT DETECTED Final   Pseudomonas aeruginosa NOT DETECTED NOT DETECTED Final   Candida albicans NOT DETECTED NOT DETECTED Final   Candida glabrata NOT DETECTED NOT DETECTED Final   Candida krusei NOT DETECTED NOT DETECTED Final   Candida parapsilosis NOT DETECTED NOT DETECTED Final   Candida tropicalis NOT DETECTED NOT DETECTED Final    Comment: Performed at [facility], [address] [place], [place] [address]  Urine culture     Status: Abnormal   Collection Time: 12/11/19  8:09 PM   Specimen: In/Out Cath Urine  Result Value Ref Range Status   Specimen Description   Final    IN/OUT CATH URINE Performed at [facility], [address], [place], [place] [address]    Special Requests   Final    NONE Performed at [facility], [place] [place], [place] [address]    Culture MULTIPLE SPECIES PRESENT, SUGGEST RECOLLECTION (A)  Final   Report Status 12/12/2019 FINAL  Final  Respiratory Panel by RT PCR (Flu A&B, Covid) - Nasopharyngeal Swab     Status: None   Collection Time: 12/11/19  9:17 PM   Specimen: Nasopharyngeal Swab  Result Value Ref Range Status   SARS Coronavirus 2 by RT PCR NEGATIVE NEGATIVE Final    Comment: (NOTE) SARS-CoV-2 target nucleic  acids are NOT DETECTED. The SARS-CoV-2 RNA is generally detectable in upper respiratoy specimens during the acute phase of infection. The lowest concentration of SARS-CoV-2 viral copies this assay can detect is 131 copies/mL. A negative result does not preclude SARS-Cov-2 infection and should not be used as the sole basis for treatment or other patient management decisions. A negative result may occur with  improper specimen collection/handling, submission of specimen other than nasopharyngeal swab, presence of viral mutation(s) within the areas targeted by this assay, and inadequate number of viral copies (<131 copies/mL). A negative result must be  combined with clinical observations, patient history, and epidemiological information. The expected result is Negative. Fact Sheet for Patients:  PinkCheek.be Fact Sheet for Healthcare Providers:  GravelBags.it This test is not yet ap proved or cleared by the [place] FDA and  has been authorized for detection and/or diagnosis of SARS-CoV-2 by FDA under an Emergency Use Authorization (EUA). This EUA will remain  in effect (meaning this test can be used) for the duration of the COVID-19 declaration under Section 564(b)(1) of the Act, 21 U.S.C. section 360bbb-3(b)(1), unless the authorization is terminated or revoked sooner.    Influenza A by PCR NEGATIVE NEGATIVE Final   Influenza B by PCR NEGATIVE NEGATIVE Final    Comment: (NOTE) The Xpert Xpress SARS-CoV-2/FLU/RSV assay is intended as an aid in  the diagnosis of influenza from Nasopharyngeal swab specimens and  should not be used as a sole basis for treatment. Nasal washings and  aspirates are unacceptable for Xpert Xpress SARS-CoV-2/FLU/RSV  testing. Fact Sheet for Patients: PinkCheek.be Fact Sheet for Healthcare Providers: GravelBags.it This test is not yet approved or cleared by the [place] FDA and  has been authorized for detection and/or diagnosis of SARS-CoV-2 by  FDA under an Emergency Use Authorization (EUA). This EUA will remain  in effect (meaning this test can be used) for the duration of the  Covid-19 declaration under Section 564(b)(1) of the Act, 21  U.S.C. section 360bbb-3(b)(1), unless the authorization is  terminated or revoked. Performed at [facility], [address] [place], [place] [address]   MRSA PCR Screening     Status: None   Collection Time: 12/12/19 12:29 AM   Specimen: Nasopharyngeal  Result Value Ref Range Status   MRSA by PCR NEGATIVE NEGATIVE Final     Comment:        The GeneXpert MRSA Assay (FDA approved for NASAL specimens only), is one component of a comprehensive MRSA colonization surveillance program. It is not intended to diagnose MRSA infection nor to guide or monitor treatment for MRSA infections. Performed at [facility], [address] [place], [place] [address]   Aerobic/Anaerobic Culture (surgical/deep wound)     Status: None   Collection Time: 12/12/19  2:06 AM   Specimen: Sacral; Wound  Result Value Ref Range Status   Specimen Description   Final    SACRAL Performed at [facility], [address] [place], [place] [address]    Special Requests   Final    NONE Performed at [facility], [place], [place] [address]    Gram Stain NO WBC SEEN NO ORGANISMS SEEN   Final   Culture   Final    FEW ESCHERICHIA COLI FEW CITROBACTER SPECIES FEW PROTEUS MIRABILIS FEW BACTEROIDES OVATUS BETA LACTAMASE POSITIVE Performed at [facility], [place] [address] [place],  [address]    Report Status 12/15/2019 FINAL  Final  Organism ID, Bacteria ESCHERICHIA COLI  Final   Organism ID, Bacteria CITROBACTER SPECIES  Final   Organism ID, Bacteria PROTEUS MIRABILIS  Final      Susceptibility   Citrobacter species - MIC*    CEFAZOLIN >=64 RESISTANT Resistant     CEFEPIME <=1 SENSITIVE Sensitive     CEFTAZIDIME >=64 RESISTANT Resistant     CEFTRIAXONE >=64 RESISTANT Resistant     CIPROFLOXACIN <=0.25 SENSITIVE Sensitive     GENTAMICIN <=1 SENSITIVE Sensitive     IMIPENEM <=0.25 SENSITIVE Sensitive     TRIMETH/SULFA <=20 SENSITIVE Sensitive     PIP/TAZO 64 INTERMEDIATE Intermediate     * FEW CITROBACTER SPECIES   Escherichia coli - MIC*    AMPICILLIN 16 INTERMEDIATE Intermediate     CEFAZOLIN >=64 RESISTANT Resistant     CEFEPIME <=1 SENSITIVE Sensitive     CEFTAZIDIME <=1 SENSITIVE Sensitive     CEFTRIAXONE <=1 SENSITIVE Sensitive     CIPROFLOXACIN <=0.25  SENSITIVE Sensitive     GENTAMICIN <=1 SENSITIVE Sensitive     IMIPENEM <=0.25 SENSITIVE Sensitive     TRIMETH/SULFA <=20 SENSITIVE Sensitive     AMPICILLIN/SULBACTAM 4 SENSITIVE Sensitive     PIP/TAZO <=4 SENSITIVE Sensitive     * FEW ESCHERICHIA COLI   Proteus mirabilis - MIC*    AMPICILLIN <=2 SENSITIVE Sensitive     CEFAZOLIN <=4 SENSITIVE Sensitive     CEFEPIME <=1 SENSITIVE Sensitive     CEFTAZIDIME <=1 SENSITIVE Sensitive     CEFTRIAXONE <=1 SENSITIVE Sensitive     CIPROFLOXACIN <=0.25 SENSITIVE Sensitive     GENTAMICIN 2 SENSITIVE Sensitive     IMIPENEM 4 SENSITIVE Sensitive     TRIMETH/SULFA <=20 SENSITIVE Sensitive     AMPICILLIN/SULBACTAM <=2 SENSITIVE Sensitive     PIP/TAZO <=4 SENSITIVE Sensitive     * FEW PROTEUS MIRABILIS  Aerobic/Anaerobic Culture (surgical/deep wound)     Status: None   Collection Time: 12/12/19  2:06 AM   Specimen: Back; Wound  Result Value Ref Range Status   Specimen Description   Final    BACK LUMBAR SPINE Performed at [facility], [address] [place], [place] [address]    Special Requests   Final    NONE Performed at [facility], [place], [place] [address]    Gram Stain NO WBC SEEN NO ORGANISMS SEEN   Final   Culture   Final    FEW PROTEUS MIRABILIS NO ANAEROBES ISOLATED Performed at [facility], [place] [address] [place], [place] [address]    Report Status 12/17/2019 FINAL  Final   Organism ID, Bacteria PROTEUS MIRABILIS  Final      Susceptibility   Proteus mirabilis - MIC*    AMPICILLIN <=2 SENSITIVE Sensitive     CEFAZOLIN <=4 SENSITIVE Sensitive     CEFEPIME <=1 SENSITIVE Sensitive     CEFTAZIDIME <=1 SENSITIVE Sensitive     CEFTRIAXONE <=1 SENSITIVE Sensitive     CIPROFLOXACIN <=0.25 SENSITIVE Sensitive     GENTAMICIN 2 SENSITIVE Sensitive     IMIPENEM 4 SENSITIVE Sensitive     TRIMETH/SULFA <=20 SENSITIVE Sensitive     AMPICILLIN/SULBACTAM <=2 SENSITIVE Sensitive      PIP/TAZO <=4 SENSITIVE Sensitive     * FEW PROTEUS MIRABILIS  Aerobic/Anaerobic Culture (surgical/deep wound)     Status: None (Preliminary result)   Collection Time: 12/19/19  3:08 PM   Specimen: Abscess  Result Value Ref Range Status   Specimen  Description   Final    ABSCESS Performed at [facility], [address], [place], [place] [address]    Special Requests   Final    NONE Performed at [facility], [place], [place] [address]    Gram Stain   Final    ABUNDANT WBC PRESENT, PREDOMINANTLY PMN RARE GRAM POSITIVE RODS RARE GRAM NEGATIVE RODS Performed at [facility], [place] [address], [place], [place] [address]    Culture   Final    FEW GRAM NEGATIVE RODS MODERATE PROTEUS MIRABILIS CULTURE REINCUBATED FOR BETTER GROWTH NO ANAEROBES ISOLATED; CULTURE IN PROGRESS FOR 5 DAYS    Report Status PENDING  Incomplete    Coagulation Studies: No results for input(s): LABPROT, INR in the last 72 hours.  Urinalysis: No results for input(s): COLORURINE, LABSPEC, PHURINE, GLUCOSEU, HGBUR, BILIRUBINUR, KETONESUR, PROTEINUR, UROBILINOGEN, NITRITE, LEUKOCYTESUR in the last 72 hours.  Invalid input(s): APPERANCEUR    Imaging: EEG  Result Date: 12/20/2019 Lora Havens, MD     12/20/2019  2:52 PM Patient Name: Taylor Tyler MRN: [id number] Epilepsy Attending: Lora Havens Referring Physician/Provider: Dr. Loletha Grayer Date: 12/20/2019 Duration: 23.05 minutes Patient history: 79 year old female with altered mental status.  EEG evaluate for seizures. Level of alertness: Awake, drowsy, sleep, comatose, lethargic AEDs during EEG study: None Technical aspects: This EEG study was done with scalp electrodes positioned according to the 10-20 International system of electrode placement. Electrical activity was acquired at a sampling rate of 500Hz  and reviewed with a high frequency filter of 70Hz  and a low frequency filter of 1Hz . EEG data were recorded  continuously and digitally stored. Description: No clear posterior dominant rhythm seen. EEG showed continuous generalized polymorphic 3 to 6 Hz theta-delta slowing, at times with triphasic morphology. Physiology photic driving was not seen during photic stimulation.  Hyperventilation was not performed.   ABNORMALITY -Continuous slow, generalized IMPRESSION: This study is suggestive of moderate diffuse encephalopathy, nonspecific etiology. No seizures or epileptiform discharges were seen throughout the recording. Priyanka Barbra Sarks     Medications:   . sodium chloride Stopped (12/12/19 2140)  . albumin human 25 g (12/20/19 1955)  . feeding supplement (NEPRO CARB STEADY) 1,000 mL (12/21/19 0636)  . meropenem (MERREM) IV 500 mg (12/21/19 1705)   . chlorhexidine  15 mL Mouth Rinse BID  . Chlorhexidine Gluconate Cloth  6 each Topical Q0600  . epoetin (EPOGEN/PROCRIT) injection  10,000 Units Intravenous Q T,Th,Sa-HD  . feeding supplement (PRO-STAT SUGAR FREE 64)  30 mL Per Tube BID  . heparin injection (subcutaneous)  5,000 Units Subcutaneous Q8H  . levothyroxine  50 mcg Intravenous Daily  . mouth rinse  15 mL Mouth Rinse q12n4p  . pantoprazole (PROTONIX) IV  40 mg Intravenous Q24H  . sodium chloride flush  10-40 mL Intracatheter Q12H   acetaminophen, docusate sodium, HYDROcodone-acetaminophen, ipratropium-albuterol, ipratropium-albuterol, morphine injection, ondansetron (ZOFRAN) IV, polyethylene glycol, sodium chloride flush  Assessment/ Plan:  79 y.o.black female with end stage renal disease on hemodialysis, dementia, diabetes mellitus type II, hypertension, hypothyroidism, osteomyelitis, now admitted with decreased responsiveness and sepsis.  [facility] [address] TTS RIJ [phone].  1.  ESRD on HD TTS: seen and examined on hemodialysis treatment. With hypokalemia. 4K bath  2.  Altered mental status: MRI negative. Off cefepime. Appreciate neurology input.   3.   Anemia of chronic kidney disease.  Hemoglobin 6.8 - transfusion as per hospitalist.  - EPO with HD treatment.  4.  Secondary hyperparathyroidism:  - oral phosphorus replacement.  5. Hypotension: blood pressure stable off any anti-hypertensive agents.  - start midodrine.   Overall prognosis is very poor despite aggressive measures.    LOS: [place] 5/20/202110:28 AM

## 2019-12-22 NOTE — Progress Notes (Signed)
PT Cancellation Note  Patient Details Name: BRITNI DRISCOLL MRN: [id number] DOB: Feb 06, 1941   Cancelled Treatment:    Reason Eval/Treat Not Completed: Other (comment). CHart reviewed. Pt currently in HD at this time. Of note, Hgb at 6.8 and K+ at 2.7. Not appropriate for PT evaluation this date. Will continue to monitor and attempt as able.   Tahiry Spicer 12/22/2019, 9:52 AM  Greggory Stallion, PT, DPT [phone]

## 2019-12-22 NOTE — Progress Notes (Signed)
Pre HD Assessment    12/22/19 0751  Neurological  Level of Consciousness Alert  Orientation Level Oriented to person  Respiratory  Respiratory Pattern Regular  Chest Assessment Chest expansion symmetrical  Cardiac  Pulse Regular  Heart Sounds S1, S2  Jugular Venous Distention (JVD) No  ECG Monitor Yes  Cardiac Rhythm ST  Vascular  R Radial Pulse +2  L Radial Pulse +2  Integumentary  Integumentary (WDL) X  Musculoskeletal  Musculoskeletal (WDL) X

## 2019-12-22 NOTE — Progress Notes (Signed)
Post HD   UF switched off for last hour of tx d/t continuously decreasing SBP. Dose of epoetin administered, Pt tolerated tx well, report given to Jun, RN.   12/22/19 1115  Vital Signs  Temp 97.6 F (36.4 C)  Temp Source Oral  Pulse Rate (!) 102  Pulse Rate Source Dinamap  Resp (!) 23  BP (!) 128/53  BP Location Left Arm  BP Method Automatic  Patient Position (if appropriate) Lying  Oxygen Therapy  SpO2 99 %  O2 Device Room Air  PAINAD (Pain Assessment in Advanced Dementia)  Breathing 0  Negative Vocalization 0  Facial Expression 0  Body Language 0  Consolability 0  PAINAD Score 0  During Hemodialysis Assessment  HD Safety Checks Performed Yes  KECN 66.3 KECN  Dialysis Fluid Bolus Normal Saline  Bolus Amount (mL) 250 mL  Intra-Hemodialysis Comments Tx completed  Post-Hemodialysis Assessment  Rinseback Volume (mL) 250 mL  KECN 66.3 V  Dialyzer Clearance Lightly streaked  Duration of HD Treatment -hour(s) 3 hour(s)  Hemodialysis Intake (mL) 500 mL  UF Total -Machine (mL) 979 mL  Net UF (mL) 479 mL  Tolerated HD Treatment Yes  Post-Hemodialysis Comments  (none)  AVG/AVF Arterial Site Held (minutes)  (n/a)  AVG/AVF Venous Site Held (minutes)  (n/a)  Hemodialysis Catheter Right Internal jugular Double lumen Permanent (Tunneled)  Placement Date/Time: 06/08/19 1400   Placed prior to admission: No  Time Out: Correct patient;Correct procedure;Correct site  Maximum sterile barrier precautions: Hand hygiene;Cap  Site Prep: Chlorhexidine (preferred)  Local Anesthetic: (c)   Ultrasou...  Site Condition No complications  Blue Lumen Status Heparin locked  Red Lumen Status Heparin locked  Purple Lumen Status N/A  Catheter fill solution Heparin 1000 units/ml  Catheter fill volume (Arterial) 1.7 cc  Catheter fill volume (Venous) 1.7  Dressing Type Occlusive  Dressing Status Clean;Dry;Intact  Interventions  (none)  Drainage Description None  Dressing Change Due 12/26/19   Post treatment catheter status Capped and Clamped

## 2019-12-22 NOTE — Consult Note (Signed)
Lucilla Lame, MD [facility]  [address], [place], [place] [address] Phone: [phone] Fax : [phone]  Consultation  Referring Provider:     Dr. Mal Misty Primary Care Physician:  McLean-Scocuzza, Nino Glow, MD Primary Gastroenterologist:    Althia Forts     Reason for Consultation:     PEG tube placement  Date of Admission:  12/11/2019 Date of Consultation:  12/22/2019         HPI:   Taylor Tyler is a 79 y.o. female who was admitted on May 9 due to sepsis and being unresponsive.  The patient was in the intensive care unit with a diagnosis of acute toxic metabolic encephalopathy secondary to severe sepsis from UTI and chronic nonhealing sacral decubitus ulcer.  The patient was reported to have a CT showing the possibility of osteomyelitis. The patient has had a slight improvement over the course of her hospital stay but her mental status has not come back to normal.  The patient is unable to maintain her p.o. intake and had an NG tube placed for tube feedings.  The patient is presently on antibiotics for a wound infection with Proteus. The patient is unable to give any history and does not answer questions.  Past Medical History:  Diagnosis Date  . Acute blood loss anemia 03/14/2017  . Acute renal failure superimposed on stage 3 chronic kidney disease ([place]) 03/14/2017  . AKI (acute kidney injury) ([place]) 02/28/2017  . Altered mental status 03/29/2017  . Anemia   . Aortic atherosclerosis ([place])   . Arthritis    "joints might ache at times; not that bad" (06/10/2018)  . Bilateral lower extremity edema 03/04/2017  . Bradycardia   . Chronic kidney disease    ?? renal insufficiency,   . CKD (chronic kidney disease) stage 3, GFR 30-59 ml/min 01/15/2017   ?? renal insufficiency, which she thinks is coming from "all these medications"  . COVID-19    10/17/19  . Dementia ([place])   . Diabetes mellitus without complication ([place])    diagnosed 4-5 yrs ago, 06/21/18- "that was years ago"  . Disease  of pancreas   . Diverticulitis    s/p perforation and partial colectomy 01/27/14 with 3 benign lymph nodes   . Diverticulosis 01/15/2017  . DJD (degenerative joint disease)   . DM (diabetes mellitus), type 2 with renal complications ([place]) 12/12/2583  . Elevated ferritin level   . Fatty liver   . Hypertension   . Hypothyroidism    "had radiation" (06/10/2018)  . Kidney disease, chronic, stage V (GFR under 15 ml/min) ([place])    per notes from nursing home-she is on dialysis  . Kidney stone   . Lethargy 02/28/2017  . Obesity, Class III, BMI 40-49.9 (morbid obesity) ([place]) 02/27/2017  . Pleural lipoma   . Postoperative wound infection 04/02/2017  . Spinal stenosis of lumbar region 01/15/2017  . Status post lumbar surgery   . Wound healing, delayed    back    Past Surgical History:  Procedure Laterality Date  . ABDOMINAL EXPOSURE N/A 02/24/2017   Procedure: ABDOMINAL EXPOSURE;  Surgeon: Angelia Mould, MD;  Location: [place] [facility];  Service: Vascular;  Laterality: N/A;  . ABDOMINAL HYSTERECTOMY  1987   no h/o abnormal paps   . ANTERIOR LAT LUMBAR FUSION N/A 02/24/2017   Procedure: Lumbar three- five Anterior lateral lumbar interbody fusion;  Surgeon: Ditty, Kevan Ny, MD;  Location: [facility];  Service: Neurosurgery;  Laterality: N/A;  L3-5 Anterior lateral lumbar  interbody fusion with removal of coflex at L3-4, L4-5  . ANTERIOR LUMBAR FUSION N/A 02/24/2017   Procedure: Stage 1: Lumbar five-Sacral one Anterior lumbar interbody fusion;  Surgeon: Ditty, Kevan Ny, MD;  Location: [facility];  Service: Neurosurgery;  Laterality: N/A;  Stage 1: L5-S1 Anterior lumbar interbody fusion  . APPENDECTOMY    . APPLICATION OF ROBOTIC ASSISTANCE FOR SPINAL PROCEDURE N/A 02/26/2017   Procedure: APPLICATION OF ROBOTIC ASSISTANCE FOR SPINAL PROCEDURE;  Surgeon: Ditty, Kevan Ny, MD;  Location: [facility];  Service: Neurosurgery;  Laterality: N/A;  . APPLICATION OF WOUND VAC N/A 03/30/2017   Procedure: APPLICATION  OF WOUND VAC;  Surgeon: Ditty, Kevan Ny, MD;  Location: [place];  Service: Neurosurgery;  Laterality: N/A;  . APPLICATION OF WOUND VAC N/A 06/23/2019   Procedure: APPLICATION OF WOUND VAC;  Surgeon: Consuella Lose, MD;  Location: [facility];  Service: Neurosurgery;  Laterality: N/A;  . AV FISTULA PLACEMENT Left 06/08/2019   Procedure: Arteriovenous (Av) Fistula Creation;  Surgeon: Rosetta Posner, MD;  Location: [facility];  Service: Vascular;  Laterality: Left;  . BACK SURGERY  2013  . CATARACT EXTRACTION W/ INTRAOCULAR LENS IMPLANT Right   . CHOLECYSTECTOMY OPEN  1982  . COLON SURGERY  01/26/2014   desc.sigmoid colectomy and ventral hernia repair and splenic flexure mobilization  . COLON SURGERY    . DILATION AND CURETTAGE OF UTERUS    . HERNIA REPAIR    . INSERTION OF DIALYSIS CATHETER Right 06/08/2019   Procedure: INSERTION OF DIALYSIS CATHETER RIGHT INTERNAL JUGULAR;  Surgeon: Rosetta Posner, MD;  Location: [facility];  Service: Vascular;  Laterality: Right;  . LUMBAR LAMINECTOMY WITH SPINOUS PROCESS PLATE 2 LEVEL N/A 1/91/4782   Procedure: LUMBAR LAMINECTOMY/DECOMPRESSION MICRODISCECTOMY CoFlex;  Surgeon: Faythe Ghee, MD;  Location: [facility];  Service: Neurosurgery;  Laterality: N/A;  Lumbar three-four,Lumbar Four-Five Laminectomy with Coflex  . LUMBAR WOUND DEBRIDEMENT N/A 03/30/2017   Procedure: Lumbar wound exploration/debridement, placement of wound vac;  Surgeon: Ditty, Kevan Ny, MD;  Location: [facility];  Service: Neurosurgery;  Laterality: N/A;  Lumbar wound exploration/debridement, placement of wound vac  . LUMBAR WOUND DEBRIDEMENT N/A 06/06/2018   Procedure: LUMBAR WOUND DEBRIDEMENT/EXPLORATION;  Surgeon: Consuella Lose, MD;  Location: [place];  Service: Neurosurgery;  Laterality: N/A;  . LUMBAR WOUND DEBRIDEMENT N/A 06/22/2018   Procedure: SIMPLE INCISION AND DRAINAGE OF WOUND, APPLICATION OF WOUND VAC;  Surgeon: Consuella Lose, MD;  Location: [facility];  Service: Neurosurgery;   Laterality: N/A;  . LUMBAR WOUND DEBRIDEMENT N/A 06/23/2019   Procedure: LUMBAR WOUND DEBRIDEMENT WITH HARDWARE REMOVAL;  Surgeon: Consuella Lose, MD;  Location: [facility];  Service: Neurosurgery;  Laterality: N/A;  . PARTIAL COLECTOMY     01/27/14 diverticulitis and 3 benign lymph nodes [facility] Dr. Pat Patrick   . REDUCTION MAMMAPLASTY Bilateral 1992  . THROMBECTOMY BRACHIAL ARTERY Left 06/08/2019   Procedure: Thrombectomy Brachial Artery;  Surgeon: Rosetta Posner, MD;  Location: [facility];  Service: Vascular;  Laterality: Left;  . TUBAL LIGATION    . VENTRAL HERNIA REPAIR  2014    Prior to Admission medications   Medication Sig Start Date End Date Taking? Authorizing Provider  acetaminophen (TYLENOL) 500 MG tablet Take 500 mg by mouth daily as needed for mild pain.    Yes [provider]  atorvastatin (LIPITOR) 40 MG tablet Take 1 tablet (40 mg total) by mouth daily at 6 PM. 09/03/18  Yes McLean-Scocuzza, Nino Glow, MD  benzonatate (TESSALON PERLES) 100 MG  capsule Take 1 capsule (100 mg total) by mouth 3 (three) times daily as needed for cough. 08/07/19 08/06/20 Yes Earleen Newport, MD  citalopram (CELEXA) 10 MG tablet Take 10 mg by mouth daily.   Yes [provider]  collagenase (SANTYL) ointment Apply topically daily. 11/24/19  Yes Nicole Kindred A, DO  divalproex (DEPAKOTE SPRINKLE) 125 MG capsule Take 125 mg by mouth 2 (two) times daily.    Yes [provider]  docusate sodium (COLACE) 100 MG capsule Take 100 mg by mouth 2 (two) times daily.   Yes [provider]  donepezil (ARICEPT) 5 MG tablet Take 5 mg by mouth at bedtime.   Yes [provider]  epoetin alfa (EPOGEN) 4000 UNIT/ML injection Inject 1 mL (4,000 Units total) into the skin Every Tuesday,Thursday,and Saturday with dialysis. 11/09/19  Yes Enzo Bi, MD  gabapentin (NEURONTIN) 100 MG capsule Take 2 capsules (200 mg total) by mouth at bedtime. 07/04/19  Yes Regalado, Belkys A, MD  labetalol (NORMODYNE)  100 MG tablet Take 1 tablet (100 mg total) by mouth 2 (two) times daily. 07/08/19  Yes Irene Pap N, DO  Lactobacillus (ACIDOPHILUS PO) Take 175 mg by mouth 2 (two) times daily.   Yes [provider]  lactulose, encephalopathy, (GENERLAC) 10 GM/15ML SOLN Take 10 g by mouth daily.   Yes [provider]  levothyroxine (SYNTHROID, LEVOTHROID) 100 MCG tablet Take 100 mcg by mouth daily before breakfast.   Yes [provider]  meclizine (ANTIVERT) 12.5 MG tablet Take 1 tablet (12.5 mg total) by mouth daily as needed for dizziness. 11/09/19  Yes Enzo Bi, MD  memantine (NAMENDA) 5 MG tablet Take 5 mg by mouth 2 (two) times daily.   Yes [provider]  Nutritional Supplements (FEEDING SUPPLEMENT, NEPRO CARB STEADY,) LIQD Take 237 mLs by mouth daily.   Yes [provider]  risperiDONE (RISPERDAL) 0.25 MG tablet Take 0.25 mg by mouth 2 (two) times daily.   Yes [provider]  sevelamer carbonate (RENVELA) 800 MG tablet Take 1 tablet (800 mg total) by mouth 3 (three) times daily with meals. 07/04/19  Yes Regalado, Belkys A, MD  traMADol (ULTRAM) 50 MG tablet Take 25 mg by mouth every 6 (six) hours as needed for moderate pain.    Yes [provider]  zinc oxide 20 % ointment Apply 1 application topically in the morning, at noon, and at bedtime.   Yes [provider]  folic acid (FOLVITE) 329 MCG tablet Take 400 mcg by mouth daily.     [provider]    Family History  Problem Relation Age of Onset  . Diabetes Father   . Heart attack Father   . Hypertension Father   . Heart disease Father   . Kidney disease Father   . Diabetes Mother   . Hypertension Mother   . Heart disease Mother   . Kidney disease Mother   . Lupus Sister   . Heart disease Brother   . Diabetes Brother   . Heart disease Brother   . Diabetes Brother   . Lupus Sister      Social History   Tobacco Use  . Smoking status: Never Smoker  .  Smokeless tobacco: Never Used  Substance Use Topics  . Alcohol use: Not Currently    Comment: "nothing since age 7" (06/10/2018)  . Drug use: Never    Allergies as of 12/11/2019 - Review Complete 12/11/2019  Allergen Reaction Noted  . Clams [  shellfish allergy] Swelling and Other (See Comments) 12/26/2016  . Hydralazine Other (See Comments) 11/28/2015  . Norvasc [amlodipine besylate] Swelling 02/23/2018  . Milk-related compounds Diarrhea 04/04/2017    Review of Systems:    All systems reviewed and negative except where noted in HPI.   Physical Exam:  Vital signs in last 24 hours: Temp:  [97.6 F (36.4 C)-99.5 F (37.5 C)] 99.2 F (37.3 C) (05/20 1651) Pulse Rate:  [40-136] 96 (05/20 1651) Resp:  [9-26] 14 (05/20 1651) BP: (82-160)/(51-86) 130/56 (05/20 1651) SpO2:  [97 %-100 %] 100 % (05/20 1651) Weight:  [98.3 kg] 98.3 kg (05/20 0803) Last BM Date: 12/22/19 General:   Laying in bed and in NAD Head:  Normocephalic and atraumatic. Eyes:   No icterus.   Conjunctiva pink. PERRLA. Ears:  Normal auditory acuity. Neck:  Supple; no masses or thyroidomegaly Lungs: Respirations even and unlabored. Lungs clear to auscultation bilaterally.   No wheezes, crackles, or rhonchi.  Heart:  Regular rate and rhythm;  Without murmur, clicks, rubs or gallops Abdomen:  Soft, nondistended, nontender. Normal bowel sounds. No appreciable masses or hepatomegaly.  No rebound or guarding.  Positive ventral hernia Rectal:  Not performed. Msk:  Symmetrical without gross deformities.   Extremities:  Without edema, cyanosis or clubbing. Neurologic:  Alert nonverbal;  grossly normal neurologically. Skin:  Intact without significant lesions or rashes. Cervical Nodes:  No significant cervical adenopathy. Psych:  Alert and not cooperative.   LAB RESULTS: Recent Labs    12/20/19 0630 12/22/19 0741  WBC 12.7* 11.9*  HGB 7.6* 6.8*  HCT 25.8* 24.5*  PLT 215 196   BMET Recent Labs    12/20/19 0630  12/22/19 0741  NA 138 137  K 3.3* 2.7*  CL 105 102  CO2 25 27  GLUCOSE 137* 190*  BUN 20 36*  CREATININE 2.30* 2.27*  CALCIUM 8.1* 7.8*   LFT Recent Labs    12/22/19 0741  ALBUMIN 1.6*   PT/INR No results for input(s): LABPROT, INR in the last 72 hours.  STUDIES: No results found.    Impression / Plan:   Assessment: Active Problems:   Anemia of chronic disease   Encephalopathy acute   Sacral decubitus ulcer, stage IV (HCC)   Severe sepsis with septic shock (HCC)   Osteomyelitis (HCC)   Goals of care, counseling/discussion   Palliative care by specialist   DNR (do not resuscitate) discussion   Failure to thrive in adult   Taylor Tyler is a 79 y.o. y/o female with poor p.o. intake and I am being requested to set the patient up for a PEG tube placement.  Plan: I spoke with the patient's son about the feeding tube placement including the risks of bleeding perforation damaging adjacent organs and even death.  The patient's son states he would like to proceed with the feeding tube placement.  The patient is already on antibiotics and I will hold the subcutaneous heparin for the procedure to be done tomorrow.  Thank you for involving me in the care of this patient.      LOS: 11 days   Lucilla Lame, MD  12/22/2019, 4:57 PM Pager [phone] 7am-5pm  Check AMION for 5pm -7am coverage and on weekends   Note: This dictation was prepared with Dragon dictation along with smaller phrase technology. Any transcriptional errors that result from this process are unintentional.

## 2019-12-22 NOTE — Progress Notes (Signed)
Inpatient Diabetes Program Recommendations  AACE/ADA: New Consensus Statement on Inpatient Glycemic Control   Target Ranges:  Prepandial:   less than 140 mg/dL      Peak postprandial:   less than 180 mg/dL (1-2 hours)      Critically ill patients:  140 - 180 mg/dL  Results for Taylor Tyler, Taylor Tyler (MRN [id number]) as of 12/22/2019 11:02  Ref. Range 12/21/2019 07:37 12/21/2019 11:54 12/21/2019 16:43 12/21/2019 20:08 12/21/2019 23:37 12/22/2019 04:31  Glucose-Capillary Latest Ref Range: 70 - 99 mg/dL 169 (H) 187 (H) 175 (H) 168 (H) 177 (H) 190 (H)   Results for Taylor Tyler, Taylor Tyler (MRN [id number]) as of 12/22/2019 11:02  Ref. Range 11/19/2019 11:11  Hemoglobin A1C Latest Ref Range: 4.8 - 5.6 % 4.7 (L)   Review of Glycemic Control  Diabetes history: DM2 Outpatient Diabetes medications:None Current orders for Inpatient glycemic control: NONE: CBGs Q4H  Inpatient Diabetes Program Recommendations:   Correction (SSI): Glucose ranged from 168-190 mg/dl over the past 24 hours. May want to consider ordering Novolog 0-6 units Q4H.  Thanks, Barnie Alderman, RN, MSN, CDE Diabetes Coordinator Inpatient Diabetes Program [phone] (Team Pager from 8am to 5pm)

## 2019-12-22 NOTE — Progress Notes (Signed)
HD Completed    12/22/19 1115  Vital Signs  Temp 97.6 F (36.4 C)  Temp Source Oral  Pulse Rate (!) 102  Pulse Rate Source Dinamap  Resp (!) 23  BP (!) 128/53  BP Location Left Arm  BP Method Automatic  Patient Position (if appropriate) Lying  Oxygen Therapy  SpO2 99 %  O2 Device Room Air  PAINAD (Pain Assessment in Advanced Dementia)  Breathing 0  Negative Vocalization 0  Facial Expression 0  Body Language 0  Consolability 0  PAINAD Score 0  During Hemodialysis Assessment  HD Safety Checks Performed Yes  KECN 66.3 KECN  Dialysis Fluid Bolus Normal Saline  Bolus Amount (mL) 250 mL  Intra-Hemodialysis Comments Tx completed

## 2019-12-22 NOTE — Progress Notes (Signed)
Pre HD     12/22/19 0803  Vital Signs  Temp 98.1 F (36.7 C)  Temp Source Axillary  Pulse Rate 100  Pulse Rate Source Monitor  Resp (!) 9  BP (!) 151/62  BP Location Left Arm  BP Method Automatic  Patient Position (if appropriate) Lying  Oxygen Therapy  SpO2 100 %  O2 Device Room Air  PAINAD (Pain Assessment in Advanced Dementia)  Breathing 0  Negative Vocalization 0  Facial Expression 0  Body Language 0  Consolability 0  PAINAD Score 0  Dialysis Weight  Weight 98.3 kg  Type of Weight Pre-Dialysis  Time-Out for Hemodialysis  What Procedure? HD   Pt Identifiers(min of two) First/Last Name;MRN/Account#;Pt's DOB(use if MRN/Acct# not available  Correct Site? Yes  Correct Side? Yes  Correct Procedure? Yes  Consents Verified? Yes  Rad Studies Available? N/A  Safety Precautions Reviewed? Yes  Education / Care Plan  Dialysis Education Provided  (Unable to assess receptiveness to education)  Documented Education in [place]  (Unable to assess receptiveness to education)  Outpatient Plan of Care Reviewed and on Chart  (Unable to assess receptiveness to education)  Hemodialysis Catheter Right Internal jugular Double lumen Permanent (Tunneled)  Placement Date/Time: 06/08/19 1400   Placed prior to admission: No  Time Out: Correct patient;Correct procedure;Correct site  Maximum sterile barrier precautions: Hand hygiene;Cap  Site Prep: Chlorhexidine (preferred)  Local Anesthetic: (c)   Ultrasou...  Site Condition No complications  Blue Lumen Status Blood return noted  Red Lumen Status Blood return noted  Purple Lumen Status N/A  Dressing Type Occlusive  Dressing Status Clean;Dry;Intact  Interventions  (none)  Drainage Description None  Dressing Change Due 12/23/19

## 2019-12-22 NOTE — Progress Notes (Signed)
Progress Note    Taylor Tyler  ULA:[id number] DOB: 1940/12/10  DOA: 12/11/2019 PCP: McLean-Scocuzza, Nino Glow, MD      Brief Narrative:    Medical records reviewed and are as summarized below:  Taylor Tyler is an 79 y.o. female  with a history of end-stage renal disease, hypertension, diabetes, history of lumbar fusion, laminectomy 2014, with revision in 2122 complicated by wound infection due to Proteus   admitted from  facility with fever and altered mental status of 1 day duration. In the ED temperature was 101.2, BP 101/ 44, heart rate of 79 and respiratory rate of 34.  As blood pressure dropped further she was admitted to the ICU and started on pressors.      Assessment/Plan:   Active Problems:   Anemia of chronic disease   Encephalopathy acute   Sacral decubitus ulcer, stage IV (HCC)   Severe sepsis with septic shock (HCC)   Osteomyelitis (HCC)   Goals of care, counseling/discussion   Palliative care by specialist   DNR (do not resuscitate) discussion   Failure to thrive in adult   S/p septic shock/stage IV sacral decubitus wound infection with abscess and underlying osteomyelitis/Proteus and Citrobacter bacteremia S/p debridement of necrotic sacral wound.  S/p CT-guided aspiration of 20 mls posterior paraspinal fluid collection.  Continue empiric IV antibiotics.  Follow-up with ID for further recommendations.  Acute toxic metabolic encephalopathy: Patient is still nonverbal. Continue supportive care  Dysphagia: Patient is on enteral nutrition via NG tube.  Plan of care was discussed with her son, Taylor Tyler.  He wants to go forward with PEG tube placement for enteral nutrition.  Consulted gastroenterologist, Dr. Allen Norris to evaluate for PEG tube placement.  Hypokalemia and hypophosphatemia: Replete potassium and phosphorus and monitor levels.  Discussed with nephrologist.  ESRD: Follow-up with nephrologist for hemodialysis.  Anemia of chronic disease: Continue  epoetin.  Monitor H&H.  Hypothyroidism: Continue IV Synthroid.  Unstageable sacral decubitus ulcer and left heel deep tissue injury (present on admission): Continue local wound care per wound nurse's recommendation.  Turn patient every 2 hours.   Body mass index is 34.98 kg/m.  (Obesity)           Pressure Injury 11/19/19 Sacrum Mid;Right Unstageable - Full thickness tissue loss in which the base of the injury is covered by slough (yellow, tan, gray, green or brown) and/or eschar (tan, brown or black) in the wound bed. deep wound with bed covered  (Active)  11/19/19 1800  Location: Sacrum  Location Orientation: Mid;Right  Staging: Unstageable - Full thickness tissue loss in which the base of the injury is covered by slough (yellow, tan, gray, green or brown) and/or eschar (tan, brown or black) in the wound bed.  Wound Description (Comments): deep wound with bed covered by slough  Present on Admission: Yes     Pressure Injury 11/19/19 Heel Left Deep Tissue Pressure Injury - Purple or maroon localized area of discolored intact skin or blood-filled blister due to damage of underlying soft tissue from pressure and/or shear. (Active)  11/19/19 1800  Location: Heel  Location Orientation: Left  Staging: Deep Tissue Pressure Injury - Purple or maroon localized area of discolored intact skin or blood-filled blister due to damage of underlying soft tissue from pressure and/or shear.  Wound Description (Comments):   Present on Admission: Yes         Family Communication/Anticipated D/C date and plan/Code Status   DVT prophylaxis: Heparin Code Status: Full code  Family Communication: None Disposition Plan:    Status is: Inpatient  Remains inpatient appropriate because:Altered mental status, IV treatments appropriate due to intensity of illness or inability to take PO and Inpatient level of care appropriate due to severity of illness   Dispo: The patient is from: SNF               Anticipated d/c is to: SNF              Anticipated d/c date is: > 3 days              Patient currently is not medically stable to d/c.            Subjective:   Patient is nonverbal and unable to provide any history.  Objective:    Vitals:   12/22/19 1045 12/22/19 1100 12/22/19 1115 12/22/19 1220  BP: 98/63 (!) 82/51 (!) 128/53 109/61  Pulse: (!) 127 (!) 119 (!) 102 99  Resp: (!) 21 18 (!) 23   Temp:   97.6 F (36.4 C) 99.1 F (37.3 C)  TempSrc:   Oral Oral  SpO2: 100% 97% 99% 100%  Weight:      Height:       No data found.   Intake/Output Summary (Last 24 hours) at 12/22/2019 1238 Last data filed at 12/22/2019 1115 Gross per 24 hour  Intake 1315 ml  Output 479 ml  Net 836 ml   Filed Weights   12/21/19 0500 12/22/19 0444 12/22/19 0803  Weight: 90.3 kg 98.3 kg 98.3 kg    Exam:  GEN: NAD SKIN: Stage IV sacral decubitus ulcer, deep tissue injury of the left heel (present on admission) EYES: EOMI ENT: MMM, NG tube in place CV: RRR PULM: CTA B ABD: soft, obese, NT, +BS CNS: She is awake but she does not follow any commands.  She moves her head from side to side to follow my movements in the room. EXT: Bilateral ankle edema, no tenderness   Data Reviewed:   I have personally reviewed following labs and imaging studies:  Labs: Labs show the following:   Basic Metabolic Panel: Recent Labs  Lab 12/16/19 0343 12/16/19 0343 12/16/19 0900 12/17/19 0447 12/17/19 1131 12/17/19 1131 12/18/19 0709 12/18/19 0709 12/19/19 1036 12/19/19 1036 12/20/19 0630 12/22/19 0741  NA 136   < >  --   --  134*  --  139  --  139  --  138 137  K 2.7*   < >   < >  --  3.6   < > 3.0*   < > 3.2*   < > 3.3* 2.7*  CL 101   < >  --   --  103  --  105  --  106  --  105 102  CO2 28   < >  --   --  24  --  27  --  26  --  25 27  GLUCOSE 105*   < >  --   --  117*  --  177*  --  116*  --  137* 190*  BUN 8   < >  --   --  10  --  6*  --  14  --  20 36*  CREATININE 1.44*    < >  --   --  2.07*  --  1.38*  --  1.90*  --  2.30* 2.27*  CALCIUM 7.3*   < >  --   --  7.6*  --  7.5*  --  7.9*  --  8.1* 7.8*  MG 1.6*  --   --  2.0  --   --  1.9  --  1.9  --  1.9  --   PHOS 2.1*   < >  --  3.4 3.7  --  1.9*  --  1.9*  --  2.1* <1.0*   < > = values in this interval not displayed.   GFR Estimated Creatinine Clearance: 24.9 mL/min (A) (by C-G formula based on SCr of 2.27 mg/dL (H)). Liver Function Tests: Recent Labs  Lab 12/17/19 1131 12/22/19 0741  ALBUMIN 1.7* 1.6*   No results for input(s): LIPASE, AMYLASE in the last 168 hours. Recent Labs  Lab 12/18/19 0709  AMMONIA 61*   Coagulation profile No results for input(s): INR, PROTIME in the last 168 hours.  CBC: Recent Labs  Lab 12/16/19 0343 12/16/19 0343 12/17/19 0447 12/17/19 0447 12/17/19 1131 12/18/19 0709 12/19/19 1036 12/20/19 0630 12/22/19 0741  WBC 10.5   < > 9.7   < > 13.2* 11.7* 14.6* 12.7* 11.9*  NEUTROABS 7.9*  --  6.6  --   --  8.0* 10.4* 8.8*  --   HGB 7.1*   < > 7.2*   < > 7.4* 7.3* 7.3* 7.6* 6.8*  HCT 22.5*   < > 23.9*   < > 24.7* 24.8* 24.1* 25.8* 24.5*  MCV 84.6   < > 88.5   < > 89.2 91.2 88.3 91.8 96.1  PLT 77*   < > 112*   < > 133* 162 170 215 196   < > = values in this interval not displayed.   Cardiac Enzymes: No results for input(s): CKTOTAL, CKMB, CKMBINDEX, TROPONINI in the last 168 hours. BNP (last 3 results) No results for input(s): PROBNP in the last 8760 hours. CBG: Recent Labs  Lab 12/21/19 1154 12/21/19 1643 12/21/19 2008 12/21/19 2337 12/22/19 0431  GLUCAP 187* 175* 168* 177* 190*   D-Dimer: No results for input(s): DDIMER in the last 72 hours. Hgb A1c: No results for input(s): HGBA1C in the last 72 hours. Lipid Profile: No results for input(s): CHOL, HDL, LDLCALC, TRIG, CHOLHDL, LDLDIRECT in the last 72 hours. Thyroid function studies: No results for input(s): TSH, T4TOTAL, T3FREE, THYROIDAB in the last 72 hours.  Invalid input(s): FREET3 Anemia  work up: No results for input(s): VITAMINB12, FOLATE, FERRITIN, TIBC, IRON, RETICCTPCT in the last 72 hours. Sepsis Labs: Recent Labs  Lab 12/18/19 0709 12/19/19 1036 12/20/19 0630 12/22/19 0741  WBC 11.7* 14.6* 12.7* 11.9*    Microbiology Recent Results (from the past 240 hour(s))  Aerobic/Anaerobic Culture (surgical/deep wound)     Status: None (Preliminary result)   Collection Time: 12/19/19  3:08 PM   Specimen: Abscess  Result Value Ref Range Status   Specimen Description   Final    ABSCESS Performed at [facility], [address], [place], [place] [address]    Special Requests   Final    NONE Performed at [facility], [address] [place], [place] [address]    Gram Stain   Final    ABUNDANT WBC PRESENT, PREDOMINANTLY PMN RARE GRAM POSITIVE RODS RARE GRAM NEGATIVE RODS Performed at [facility], [place] [address], [place], [place] [address]    Culture   Final    FEW ESCHERICHIA COLI MODERATE PROTEUS MIRABILIS NO ANAEROBES ISOLATED; CULTURE IN PROGRESS FOR 5 DAYS    Report Status PENDING  Incomplete   Organism ID, Bacteria ESCHERICHIA COLI  Final   Organism ID, Bacteria PROTEUS MIRABILIS  Final      Susceptibility   Escherichia coli - MIC*    AMPICILLIN >=32 RESISTANT Resistant     CEFAZOLIN >=64 RESISTANT Resistant     CEFEPIME <=1 SENSITIVE Sensitive     CEFTAZIDIME >=64 RESISTANT Resistant     CEFTRIAXONE >=64 RESISTANT Resistant     CIPROFLOXACIN <=0.25 SENSITIVE Sensitive     GENTAMICIN <=1 SENSITIVE Sensitive     IMIPENEM 1 SENSITIVE Sensitive     TRIMETH/SULFA <=20 SENSITIVE Sensitive     AMPICILLIN/SULBACTAM >=32 RESISTANT Resistant     PIP/TAZO 64 INTERMEDIATE Intermediate     * FEW ESCHERICHIA COLI   Proteus mirabilis - MIC*    AMPICILLIN >=32 RESISTANT Resistant     CEFAZOLIN >=64 RESISTANT Resistant     CEFEPIME RESISTANT Resistant     CEFTAZIDIME >=64 RESISTANT Resistant     CEFTRIAXONE >=64 RESISTANT Resistant      CIPROFLOXACIN <=0.25 SENSITIVE Sensitive     GENTAMICIN <=1 SENSITIVE Sensitive     IMIPENEM 4 SENSITIVE Sensitive     TRIMETH/SULFA <=20 SENSITIVE Sensitive     AMPICILLIN/SULBACTAM <=2 SENSITIVE Sensitive     PIP/TAZO <=4 SENSITIVE Sensitive     * MODERATE PROTEUS MIRABILIS    Procedures and diagnostic studies:  EEG  Result Date: 12/20/2019 Taylor Havens, MD     12/20/2019  2:52 PM Patient Name: AMBIKA ZETTLEMOYER MRN: [id number] Epilepsy Attending: Lora Tyler Referring Physician/Provider: Dr. Loletha Tyler Date: 12/20/2019 Duration: 23.05 minutes Patient history: 79 year old female with altered mental status.  EEG evaluate for seizures. Level of alertness: Awake, drowsy, sleep, comatose, lethargic AEDs during EEG study: None Technical aspects: This EEG study was done with scalp electrodes positioned according to the 10-20 International system of electrode placement. Electrical activity was acquired at a sampling rate of 500Hz  and reviewed with a high frequency filter of 70Hz  and a low frequency filter of 1Hz . EEG data were recorded continuously and digitally stored. Description: No clear posterior dominant rhythm seen. EEG showed continuous generalized polymorphic 3 to 6 Hz theta-delta slowing, at times with triphasic morphology. Physiology photic driving was not seen during photic stimulation.  Hyperventilation was not performed.   ABNORMALITY -Continuous slow, generalized IMPRESSION: This study is suggestive of moderate diffuse encephalopathy, nonspecific etiology. No seizures or epileptiform discharges were seen throughout the recording. Taylor Tyler    Medications:   . chlorhexidine  15 mL Mouth Rinse BID  . Chlorhexidine Gluconate Cloth  6 each Topical Q0600  . epoetin (EPOGEN/PROCRIT) injection  10,000 Units Intravenous Q T,Th,Sa-HD  . feeding supplement (PRO-STAT SUGAR FREE 64)  30 mL Per Tube BID  . heparin injection (subcutaneous)  5,000 Units Subcutaneous Q8H  .  levothyroxine  50 mcg Intravenous Daily  . mouth rinse  15 mL Mouth Rinse q12n4p  . midodrine  5 mg Per Tube TID WC  . pantoprazole (PROTONIX) IV  40 mg Intravenous Q24H  . phosphorus  500 mg Per Tube BID  . sodium chloride flush  10-40 mL Intracatheter Q12H   Continuous Infusions: . sodium chloride Stopped (12/12/19 2140)  . albumin human 25 g (12/22/19 1215)  . feeding supplement (NEPRO CARB STEADY) 1,000 mL (12/21/19 0636)  . meropenem (MERREM) IV 500 mg (12/21/19 1705)     LOS: 11 days   BERNARD AYIKU  [facility]     12/22/2019, 12:38 PM

## 2019-12-22 NOTE — Progress Notes (Signed)
Pre HD Machine Checks    12/22/19 0740  Machine Checks  Machine Number 5  Station Number 2 ([id number])  UF/Alarm Test Passed  Conductivity: Meter 14  Conductivity: Machine  13.8  pH 7.2  Reverse Osmosis 4  Normal Saline Lot Number [id number]  Dialyzer Lot Number [id number]  Disposable Set Lot Number [id number]  Machine Temperature 98.6 F (37 C)  Musician and Audible Yes  Blood Lines Intact and Secured Yes  Pre Treatment Patient Checks  Vascular access used during treatment Catheter  HD catheter dressing before treatment WDL  Patient is receiving dialysis in a chair  (no)  Hepatitis B Surface Antigen Results Negative  Date Hepatitis B Surface Antigen Drawn 12/13/19  Isolation Initiated  (no )  Hepatitis B Surface Antibody  (>10)  Date Hepatitis B Surface Antibody Drawn 12/13/19  Hemodialysis Consent Verified Yes  Hemodialysis Standing Orders Initiated Yes  ECG (Telemetry) Monitor On Yes  Prime Ordered Normal Saline  Length of  DialysisTreatment -hour(s) 3 Hour(s)  Dialysis Treatment Comments  (Na 140)  Dialyzer Elisio 17H NR  Dialysate 2K;2.5 Ca  Dialysate Flow Ordered 600  Blood Flow Rate Ordered 400 mL/min  Ultrafiltration Goal 1 Liters  Dialysis Blood Pressure Support Ordered Normal Saline

## 2019-12-22 NOTE — Progress Notes (Signed)
Nutrition Follow-up  DOCUMENTATION CODES:   Not applicable  INTERVENTION:  Initiate new goal TF regimen of Jevity 1.5 Cal at 50 mL/hr (1200 mL goal daily volume) + Pro-Stat 30 mL BID per tube. Provides 2000 kcal, 107 grams of protein, 912 mL H2O daily.  Provide B-complex with C QHS per tube.  NUTRITION DIAGNOSIS:   Increased nutrient needs related to wound healing, chronic illness(ESRD on HD) as evidenced by estimated needs.  Ongoing.  GOAL:   Patient will meet greater than or equal to 90% of their needs  Met with TF regimen.  MONITOR:   TF tolerance, Skin, Labs  REASON FOR ASSESSMENT:   Consult Enteral/tube feeding initiation and management  ASSESSMENT:   79 yo female admitted with sepsis, decreased responsiveness. PMH includes ESRD on HD, dementia, DM-2, HTN, hypothyroidism, osteomyelitis.  Patient remains NPO with NGT in place and tube feeds infusing. Patient is receiving Nepro at 45 mL/hr with free water flush of 10 mL Q2hrs. Discussed with RN. Patient remains nonverbal at this time and with decreased responsiveness. She is tolerating tube feeds. Per RN plan is for PEG tube placement.  Patient resting comfortably in bed at time of RD assessment. No family members present. Patient had returned from HD today.   Medications reviewed and include: Epogen 10000 units during HD, Pro-Stat 30 mL BID per tube, levothyroxine, K Phos Neutral 500 mg BID per tube, 25 grams human albumin during HD, meropenem.  Labs reviewed: CBG 155-190, Potassium 2.7, BUN 36, Creatinine 2.27, Phosphorus <1.  Enteral Access: 14 Fr. NGT left nare; terminates in mid stomach per abdominal x-ray 5/15  Discussed with Nephrology via secure chat. Plan is to switch patient to a standard tube feed formula for now as she is experiencing hypophosphatemia and hypokalemia on Nepro.  NUTRITION - FOCUSED PHYSICAL EXAM:    Most Recent Value  Orbital Region  No depletion  Upper Arm Region  No depletion   Thoracic and Lumbar Region  No depletion  Buccal Region  No depletion  Temple Region  No depletion  Clavicle Bone Region  No depletion  Clavicle and Acromion Bone Region  No depletion  Scapular Bone Region  Unable to assess  Dorsal Hand  No depletion  Patellar Region  Unable to assess  Anterior Thigh Region  Unable to assess  Posterior Calf Region  Unable to assess  Edema (RD Assessment)  Moderate  Hair  Reviewed  Eyes  Reviewed  Mouth  Unable to assess  Skin  Reviewed  Nails  Reviewed     Diet Order:   Diet Order            Diet NPO time specified  Diet effective now             EDUCATION NEEDS:   Not appropriate for education at this time  Skin:  Skin Assessment: Skin Integrity Issues: Skin Integrity Issues:: DTI, Unstageable, Incisions DTI: L heel Unstageable: sacrum Incisions: non healing surgical wound to back  Last BM:  12/22/2019 - large type 6  Height:   Ht Readings from Last 1 Encounters:  12/12/19 '5\' 6"'  (1.676 m)   Weight:   Wt Readings from Last 1 Encounters:  12/22/19 98.3 kg   Ideal Body Weight:  59.1 kg  BMI:  Body mass index is 34.98 kg/m.  Estimated Nutritional Needs:   Kcal:  3546-5681  Protein:  100-115 gm  Fluid:  UOP + 1 L  Jacklynn Barnacle, MS, RD, LDN Pager number available on Amion

## 2019-12-22 NOTE — Plan of Care (Signed)
  Problem: Clinical Measurements: Goal: Ability to maintain clinical measurements within normal limits will improve Outcome: Progressing   Problem: Safety: Goal: Ability to remain free from injury will improve Outcome: Progressing   Problem: Skin Integrity: Goal: Risk for impaired skin integrity will decrease Outcome: Progressing   

## 2019-12-22 NOTE — Progress Notes (Signed)
Post HD Assessment    12/22/19 0751  Neurological  Level of Consciousness Alert  Orientation Level Oriented to person  Respiratory  Respiratory Pattern Regular  Chest Assessment Chest expansion symmetrical  Cardiac  Pulse Regular  Heart Sounds S1, S2  Jugular Venous Distention (JVD) No  ECG Monitor Yes  Cardiac Rhythm ST  Vascular  R Radial Pulse +2  L Radial Pulse +2  Integumentary  Integumentary (WDL) X  Musculoskeletal  Musculoskeletal (WDL) X

## 2019-12-22 NOTE — Progress Notes (Signed)
   Daily Progress Note   Patient Name: Taylor Tyler       Date: 12/22/2019 DOB: [birth year]  Age: 79 y.o. MRN#: [id number] Attending Physician: Jennye Boroughs, MD Primary Care Physician: McLean-Scocuzza, Nino Glow, MD Admit Date: 12/11/2019  Reason for Consultation/Follow-up: Establishing goals of care  Subjective: Chart reviewed. Patient remains somewhat lethargic. Will open eyes to verbal stimuli. No verbal response. Continues with enteral nutrition via Coretrak. Per nephrologist episode this week of hypotension with HD not allowing patient to complete. Midodrine initiated.   Despite discussions with sons regarding poor prognosis, family remains hopeful for some improvement. Requesting full scope and full code. They are interesting in pursuing aggressive measures such as PEG placement for continued nutrition, with awareness it is not recommended given co-morbidities and advanced dementia.   Length of Stay: 11 days  Vital Signs: BP 109/61 (BP Location: Left Arm)   Pulse 99   Temp 99.1 F (37.3 C) (Oral)   Resp 20   Ht 5\' 6"  (1.676 m)   Wt 98.3 kg   SpO2 100%   BMI 34.98 kg/m  SpO2: SpO2: 100 % O2 Device: O2 Device: Room Air O2 Flow Rate: O2 Flow Rate (L/min): 2 L/min  Intake/output summary:   Intake/Output Summary (Last 24 hours) at 12/22/2019 1522 Last data filed at 12/22/2019 1215 Gross per 24 hour  Intake 2386.78 ml  Output 479 ml  Net 1907.78 ml   LBM:   Baseline Weight: Weight: 75 kg Most recent weight: Weight: 98.3 kg  Physical Exam: -Chronically-ill appearing -Normal breathing efforts, NAD -Coretrak for nutrition     -lethargic, will open eyes to verbal stimuli, will not follow commands, nonverbal         Palliative Care Assessment & Plan    Code Status:  Full code   Goals of Care:  Full Code  Treat the treatable per sons. Continue with current aggressive medical care plan per medical team.   Sons are interested in pursuing PEG tube for long-term  nutrition despite awareness of patient's poor prognosis and recommendations against placement in the setting of advanced dementia.   PMT will continue to support and follow as needed. I will be off service after today and returning on Monday. For urgent needs please call the PMT line.   Prognosis: Guarded to Poor   Discharge Planning: To Be Determined  Thank you for allowing the Palliative Medicine Team to assist in the care of this patient.  Time Total: 25 min.   Visit consisted of counseling and education dealing with the complex and emotionally intense issues of symptom management and palliative care in the setting of serious and potentially life-threatening illness.Greater than 50%  of this time was spent counseling and coordinating care related to the above assessment and plan.  Alda Lea, AGPCNP-BC  Palliative Medicine Team [phone]

## 2019-12-22 NOTE — Progress Notes (Signed)
HD Inititiated    12/22/19 0809  Vital Signs  Temp 98.1 F (36.7 C)  Temp Source Axillary  Pulse Rate 100  Pulse Rate Source Dinamap  BP (!) 147/69  BP Location Left Arm  BP Method Automatic  Patient Position (if appropriate) Lying  Oxygen Therapy  SpO2 100 %  O2 Device Room Air  During Hemodialysis Assessment  Blood Flow Rate (mL/min) 400 mL/min  Arterial Pressure (mmHg) -150 mmHg  Venous Pressure (mmHg) 120 mmHg  Transmembrane Pressure (mmHg) 40 mmHg  Ultrafiltration Rate (mL/min) 500 mL/min  Dialysate Flow Rate (mL/min) 600 ml/min  Conductivity: Machine  14  HD Safety Checks Performed Yes  Dialysis Fluid Bolus Normal Saline  Bolus Amount (mL) 250 mL  Intra-Hemodialysis Comments Tx initiated

## 2019-12-23 ENCOUNTER — Encounter: Payer: Self-pay | Admitting: Certified Registered Nurse Anesthetist

## 2019-12-23 DIAGNOSIS — E876 Hypokalemia: Secondary | ICD-10-CM

## 2019-12-23 LAB — GLUCOSE, CAPILLARY
Glucose-Capillary: 163 mg/dL — ABNORMAL HIGH (ref 70–99)
Glucose-Capillary: 177 mg/dL — ABNORMAL HIGH (ref 70–99)
Glucose-Capillary: 185 mg/dL — ABNORMAL HIGH (ref 70–99)
Glucose-Capillary: 187 mg/dL — ABNORMAL HIGH (ref 70–99)
Glucose-Capillary: 193 mg/dL — ABNORMAL HIGH (ref 70–99)
Glucose-Capillary: 200 mg/dL — ABNORMAL HIGH (ref 70–99)
Glucose-Capillary: 201 mg/dL — ABNORMAL HIGH (ref 70–99)
Glucose-Capillary: 216 mg/dL — ABNORMAL HIGH (ref 70–99)

## 2019-12-23 LAB — HEMOGLOBIN: Hemoglobin: 6.8 g/dL — ABNORMAL LOW (ref 12.0–15.0)

## 2019-12-23 LAB — BASIC METABOLIC PANEL
Anion gap: 9 (ref 5–15)
BUN: 35 mg/dL — ABNORMAL HIGH (ref 8–23)
CO2: 29 mmol/L (ref 22–32)
Calcium: 7.9 mg/dL — ABNORMAL LOW (ref 8.9–10.3)
Chloride: 101 mmol/L (ref 98–111)
Creatinine, Ser: 1.83 mg/dL — ABNORMAL HIGH (ref 0.44–1.00)
GFR calc Af Amer: 31 mL/min — ABNORMAL LOW (ref >60)
GFR calc non Af Amer: 26 mL/min — ABNORMAL LOW (ref >60)
Glucose, Bld: 204 mg/dL — ABNORMAL HIGH (ref 70–99)
Potassium: 2.9 mmol/L — ABNORMAL LOW (ref 3.5–5.1)
Sodium: 139 mmol/L (ref 135–145)

## 2019-12-23 LAB — PHOSPHORUS: Phosphorus: 1.6 mg/dL — ABNORMAL LOW (ref 2.5–4.6)

## 2019-12-23 LAB — MAGNESIUM: Magnesium: 1.5 mg/dL — ABNORMAL LOW (ref 1.7–2.4)

## 2019-12-23 MED ORDER — MAGNESIUM SULFATE 2 GM/50ML IV SOLN
2.0000 g | Freq: Once | INTRAVENOUS | Status: AC
  Administered 2019-12-23: 2 g via INTRAVENOUS
  Filled 2019-12-23: qty 50

## 2019-12-23 MED ORDER — POTASSIUM PHOSPHATES 15 MMOLE/5ML IV SOLN
10.0000 mmol | Freq: Once | INTRAVENOUS | Status: AC
  Administered 2019-12-23: 10 mmol via INTRAVENOUS
  Filled 2019-12-23: qty 3.33

## 2019-12-23 MED ORDER — INSULIN ASPART 100 UNIT/ML ~~LOC~~ SOLN: 0.0000 [IU] | Freq: Every day | SUBCUTANEOUS | Status: DC

## 2019-12-23 MED ORDER — INSULIN ASPART 100 UNIT/ML ~~LOC~~ SOLN
0.0000 [IU] | Freq: Three times a day (TID) | SUBCUTANEOUS | Status: DC
  Administered 2019-12-23: 3 [IU] via SUBCUTANEOUS
  Administered 2019-12-26 (×2): 2 [IU] via SUBCUTANEOUS
  Administered 2019-12-26 – 2019-12-30 (×7): 1 [IU] via SUBCUTANEOUS
  Filled 2019-12-23 (×10): qty 1

## 2019-12-23 NOTE — Progress Notes (Signed)
ID Patient Vitals for the past 24 hrs:  BP Temp Temp src Pulse Resp SpO2  12/23/19 1650 (!) 151/52 98.7 F (37.1 C) Oral 88 16 100 %  12/23/19 0752 (!) 153/69 98.7 F (37.1 C) Oral 87 15 100 %  12/23/19 0006 (!) 136/53 98.5 F (36.9 C) Oral 95 16 100 %  12/22/19 2034 (!) 130/45 (!) 96.8 F (36 C) -- 89 16 99 %   Pt more awake today Responded to commands like showing her tongue and lifting arms Non verbal Chest b/l air entry HS irregular NG tube in place Rt IJ dialysis cath   CBC Latest Ref Rng & Units 12/23/2019 12/22/2019 12/20/2019  WBC 4.0 - 10.5 K/uL - 11.9(H) 12.7(H)  Hemoglobin 12.0 - 15.0 g/dL 6.8(L) 6.8(L) 7.6(L)  Hematocrit 36.0 - 46.0 % - 24.5(L) 25.8(L)  Platelets 150 - 400 K/uL - 196 215     CMP Latest Ref Rng & Units 12/23/2019 12/22/2019 12/20/2019  Glucose 70 - 99 mg/dL 204(H) 190(H) 137(H)  BUN 8 - 23 mg/dL 35(H) 36(H) 20  Creatinine 0.44 - 1.00 mg/dL 1.83(H) 2.27(H) 2.30(H)  Sodium 135 - 145 mmol/L 139 137 138  Potassium 3.5 - 5.1 mmol/L 2.9(L) 2.7(LL) 3.3(L)  Chloride 98 - 111 mmol/L 101 102 105  CO2 22 - 32 mmol/L 29 27 25   Calcium 8.9 - 10.3 mg/dL 7.9(L) 7.8(L) 8.1(L)  Total Protein 6.5 - 8.1 g/dL - - -  Total Bilirubin 0.3 - 1.2 mg/dL - - -  Alkaline Phos 38 - 126 U/L - - -  AST 15 - 41 U/L - - -  ALT 0 - 44 U/L - - -   Microbiology  Blood culture from 12/11/2019 4 out of 4 bottles Proteus mirabilis and Citrobacter youngae Proteus is pansensitive Citrobacter is sensitive to cefepime, ciprofloxacin, gentamicin and imipenem and trimethoprim sulfamethoxazole    12/12/2019 Site of prior surgery at the lumbar spine L4 area Proteus mirabilis pansensitive.  12/12/2019 from the new sacral ulcer Multiple organisms including E. coli, Citrobacter, Bacteroides, Proteus.  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 The recent CT abdomen shows 6.2 cm x 4.5 cm area of air and fluidcolectionseen posterior to the spinal canal at the level of the L4 vertebral body.The lumbar wound has proteus in the culture IR aspirated 20 cc of fluid from the paraspinal area on 12/19/19 and it is proteus  Stage IV sacral decubitus- debrided The sacral ulcer has citrobacter, e.coli, proteus, anerobes ( bacteroides)  Encephalopathy Pt was on cefepime + flagyl and because of encephalopathy they were discontinued on 5/18 and she is now on meropenem- some improvement    ESRD on dialysis  Pt has poor prognosis   Very likely will go on PO cipro until 01/22/20. Continue meropenem while in hospital  Doubt  antibiotic is ever going to cure the infection in the spine especially with her being bed bound and having new pressure wounds as well Palliative on board -  PEG pending  Discussed the management with her nurse

## 2019-12-23 NOTE — Progress Notes (Signed)
[facility]  ROUNDING NOTE   Subjective:   Hemodialysis treatment yesterday. Even with no ultrafiltration.   Gastric tube scheduled for placement today by Dr. Allen Norris.   Objective:  Vital signs in last 24 hours:  Temp:  [96.8 F (36 C)-99.2 F (37.3 C)] 98.7 F (37.1 C) (05/21 0752) Pulse Rate:  [87-119] 87 (05/21 0752) Resp:  [14-23] 15 (05/21 0752) BP: (82-153)/(45-69) 153/69 (05/21 0752) SpO2:  [97 %-100 %] 100 % (05/21 0752)  Weight change: 0 kg Filed Weights   12/21/19 0500 12/22/19 0444 12/22/19 0803  Weight: 90.3 kg 98.3 kg 98.3 kg    Intake/Output: I/O last 3 completed shifts: In: 2048 [NG/GT:1798; IV Piggyback:250] Out: 500 [Other:479]   Intake/Output this shift:  No intake/output data recorded.  Physical Exam: General: ill-appearing  Head: +NGT  Eyes: Anicteric  Neck: Supple, trachea midline  Lungs:  Clear to auscultation, normal effort  Heart: regular  Abdomen:  + ventral hernia  Extremities: 1+ peripheral edema.  Neurologic: opens eyes to verbal stimuli.    Skin: Warm/dry, sacral wound not examined (pictures reviewed)  Access: Right IJ PermCath    Basic Metabolic Panel: Recent Labs  Lab 12/17/19 0447 12/17/19 0447 12/17/19 1131 12/17/19 1131 12/18/19 0709 12/18/19 0709 12/19/19 1036 12/20/19 0630 12/22/19 0741  NA  --   --  134*  --  139  --  139 138 137  K  --   --  3.6  --  3.0*  --  3.2* 3.3* 2.7*  CL  --   --  103  --  105  --  106 105 102  CO2  --   --  24  --  27  --  26 25 27   GLUCOSE  --   --  117*  --  177*  --  116* 137* 190*  BUN  --   --  10  --  6*  --  14 20 36*  CREATININE  --   --  2.07*  --  1.38*  --  1.90* 2.30* 2.27*  CALCIUM  --   --  7.6*   < > 7.5*   < > 7.9* 8.1* 7.8*  MG 2.0  --   --   --  1.9  --  1.9 1.9  --   PHOS 3.4   < > 3.7  --  1.9*  --  1.9* 2.1* <1.0*   < > = values in this interval not displayed.    Liver Function Tests: Recent Labs  Lab 12/17/19 1131 12/22/19 0741  ALBUMIN 1.7*  1.6*   No results for input(s): LIPASE, AMYLASE in the last 168 hours. Recent Labs  Lab 12/18/19 0709  AMMONIA 61*    CBC: Recent Labs  Lab 12/17/19 0447 12/17/19 0447 12/17/19 1131 12/18/19 0709 12/19/19 1036 12/20/19 0630 12/22/19 0741  WBC 9.7   < > 13.2* 11.7* 14.6* 12.7* 11.9*  NEUTROABS 6.6  --   --  8.0* 10.4* 8.8*  --   HGB 7.2*   < > 7.4* 7.3* 7.3* 7.6* 6.8*  HCT 23.9*   < > 24.7* 24.8* 24.1* 25.8* 24.5*  MCV 88.5   < > 89.2 91.2 88.3 91.8 96.1  PLT 112*   < > 133* 162 170 215 196   < > = values in this interval not displayed.    Cardiac Enzymes: No results for input(s): CKTOTAL, CKMB, CKMBINDEX, TROPONINI in the last 168 hours.  BNP: Invalid input(s): POCBNP  CBG: Recent  Labs  Lab 12/22/19 1654 12/22/19 2035 12/23/19 0038 12/23/19 0419 12/23/19 0806  GLUCAP 166* 149* 163* 193* 185*    Microbiology: Results for orders placed or performed during the hospital encounter of 12/11/19  Blood Culture (routine x 2)     Status: Abnormal   Collection Time: 12/11/19  7:45 PM   Specimen: BLOOD  Result Value Ref Range Status   Specimen Description   Final    BLOOD RIGHT FA Performed at [facility], [address], [place], [place] [address]    Special Requests   Final    BOTTLES DRAWN AEROBIC AND ANAEROBIC Blood Culture adequate volume Performed at [facility], [address] [place], [place] [address]    Culture  Setup Time   Final    GRAM NEGATIVE RODS IN BOTH AEROBIC AND ANAEROBIC BOTTLES CRITICAL RESULT CALLED TO, READ BACK BY AND VERIFIED WITH: Rayna Sexton AT 2536 ON 12/12/19 SNG    Culture (A)  Final    PROTEUS MIRABILIS CITROBACTER YOUNGAE SUSCEPTIBILITIES PERFORMED ON PREVIOUS CULTURE WITHIN THE LAST 5 DAYS. Performed at [facility], [place] [address] [place], [place] [address]    Report Status 12/16/2019 FINAL  Final  Blood Culture (routine x 2)     Status: Abnormal   Collection Time: 12/11/19  7:45 PM    Specimen: BLOOD  Result Value Ref Range Status   Specimen Description   Final    BLOOD RIGHT ARM Performed at [facility], [address], [place], [place] [address]    Special Requests   Final    BOTTLES DRAWN AEROBIC AND ANAEROBIC Blood Culture adequate volume Performed at [facility], [address] [place], [place] [address]    Culture  Setup Time   Final    GRAM NEGATIVE RODS IN BOTH AEROBIC AND ANAEROBIC BOTTLES CRITICAL VALUE NOTED.  VALUE IS CONSISTENT WITH PREVIOUSLY REPORTED AND CALLED VALUE. Performed at [facility], [address] [place], [place] [address]    Culture PROTEUS MIRABILIS Su Hoff  (A)  Final   Report Status 12/16/2019 FINAL  Final   Organism ID, Bacteria PROTEUS MIRABILIS  Final   Organism ID, Bacteria CITROBACTER YOUNGAE  Final      Susceptibility   Citrobacter youngae - MIC*    CEFAZOLIN >=64 RESISTANT Resistant     CEFEPIME <=1 SENSITIVE Sensitive     CEFTAZIDIME >=64 RESISTANT Resistant     CEFTRIAXONE >=64 RESISTANT Resistant     CIPROFLOXACIN <=0.25 SENSITIVE Sensitive     GENTAMICIN <=1 SENSITIVE Sensitive     IMIPENEM 1 SENSITIVE Sensitive     TRIMETH/SULFA <=20 SENSITIVE Sensitive     PIP/TAZO 64 INTERMEDIATE Intermediate     * CITROBACTER YOUNGAE   Proteus mirabilis - MIC*    AMPICILLIN <=2 SENSITIVE Sensitive     CEFAZOLIN <=4 SENSITIVE Sensitive     CEFEPIME <=1 SENSITIVE Sensitive     CEFTAZIDIME <=1 SENSITIVE Sensitive     CEFTRIAXONE <=1 SENSITIVE Sensitive     CIPROFLOXACIN <=0.25 SENSITIVE Sensitive     GENTAMICIN <=1 SENSITIVE Sensitive     IMIPENEM 4 SENSITIVE Sensitive     TRIMETH/SULFA <=20 SENSITIVE Sensitive     AMPICILLIN/SULBACTAM <=2 SENSITIVE Sensitive     PIP/TAZO <=4 SENSITIVE Sensitive     * PROTEUS MIRABILIS  Blood Culture ID Panel (Reflexed)     Status: Abnormal   Collection Time: 12/11/19  7:45 PM  Result Value Ref Range Status   Enterococcus  species NOT DETECTED  NOT DETECTED Final   Listeria monocytogenes NOT DETECTED NOT DETECTED Final   Staphylococcus species NOT DETECTED NOT DETECTED Final   Staphylococcus aureus (BCID) NOT DETECTED NOT DETECTED Final   Streptococcus species NOT DETECTED NOT DETECTED Final   Streptococcus agalactiae NOT DETECTED NOT DETECTED Final   Streptococcus pneumoniae NOT DETECTED NOT DETECTED Final   Streptococcus pyogenes NOT DETECTED NOT DETECTED Final   Acinetobacter baumannii NOT DETECTED NOT DETECTED Final   Enterobacteriaceae species DETECTED (A) NOT DETECTED Final    Comment: Enterobacteriaceae represent a large family of gram-negative bacteria, not a single organism. CRITICAL RESULT CALLED TO, READ BACK BY AND VERIFIED WITH: Rayna Sexton AT 8676 ON 12/12/19 SNG    Enterobacter cloacae complex NOT DETECTED NOT DETECTED Final   Escherichia coli NOT DETECTED NOT DETECTED Final   Klebsiella oxytoca NOT DETECTED NOT DETECTED Final   Klebsiella pneumoniae NOT DETECTED NOT DETECTED Final   Proteus species DETECTED (A) NOT DETECTED Final    Comment: CRITICAL RESULT CALLED TO, READ BACK BY AND VERIFIED WITH: Rayna Sexton AT 1950 ON 12/12/19 SNG    Serratia marcescens NOT DETECTED NOT DETECTED Final   Carbapenem resistance NOT DETECTED NOT DETECTED Final   Haemophilus influenzae NOT DETECTED NOT DETECTED Final   Neisseria meningitidis NOT DETECTED NOT DETECTED Final   Pseudomonas aeruginosa NOT DETECTED NOT DETECTED Final   Candida albicans NOT DETECTED NOT DETECTED Final   Candida glabrata NOT DETECTED NOT DETECTED Final   Candida krusei NOT DETECTED NOT DETECTED Final   Candida parapsilosis NOT DETECTED NOT DETECTED Final   Candida tropicalis NOT DETECTED NOT DETECTED Final    Comment: Performed at [facility], [address], [place], [place] [address]  Urine culture     Status: Abnormal   Collection Time: 12/11/19  8:09 PM   Specimen: In/Out Cath Urine  Result Value Ref Range Status   Specimen  Description   Final    IN/OUT CATH URINE Performed at [facility], [address] [place], [place] [address]    Special Requests   Final    NONE Performed at [facility], [address] [place],  [address]    Culture MULTIPLE SPECIES PRESENT, SUGGEST RECOLLECTION (A)  Final   Report Status 12/12/2019 FINAL  Final  Respiratory Panel by RT PCR (Flu A&B, Covid) - Nasopharyngeal Swab     Status: None   Collection Time: 12/11/19  9:17 PM   Specimen: Nasopharyngeal Swab  Result Value Ref Range Status   SARS Coronavirus 2 by RT PCR NEGATIVE NEGATIVE Final    Comment: (NOTE) SARS-CoV-2 target nucleic acids are NOT DETECTED. The SARS-CoV-2 RNA is generally detectable in upper respiratoy specimens during the acute phase of infection. The lowest concentration of SARS-CoV-2 viral copies this assay can detect is 131 copies/mL. A negative result does not preclude SARS-Cov-2 infection and should not be used as the sole basis for treatment or other patient management decisions. A negative result may occur with  improper specimen collection/handling, submission of specimen other than nasopharyngeal swab, presence of viral mutation(s) within the areas targeted by this assay, and inadequate number of viral copies (<131 copies/mL). A negative result must be combined with clinical observations, patient history, and epidemiological information. The expected result is Negative. Fact Sheet for Patients:  PinkCheek.be Fact Sheet for Healthcare Providers:  GravelBags.it This test is not yet ap proved or cleared by the [place] FDA and  has been authorized for detection and/or  diagnosis of SARS-CoV-2 by FDA under an Emergency Use Authorization (EUA). This EUA will remain  in effect (meaning this test can be used) for the duration of the COVID-19 declaration under Section 564(b)(1) of the Act, 21 U.S.C. section  360bbb-3(b)(1), unless the authorization is terminated or revoked sooner.    Influenza A by PCR NEGATIVE NEGATIVE Final   Influenza B by PCR NEGATIVE NEGATIVE Final    Comment: (NOTE) The Xpert Xpress SARS-CoV-2/FLU/RSV assay is intended as an aid in  the diagnosis of influenza from Nasopharyngeal swab specimens and  should not be used as a sole basis for treatment. Nasal washings and  aspirates are unacceptable for Xpert Xpress SARS-CoV-2/FLU/RSV  testing. Fact Sheet for Patients: PinkCheek.be Fact Sheet for Healthcare Providers: GravelBags.it This test is not yet approved or cleared by the [place] FDA and  has been authorized for detection and/or diagnosis of SARS-CoV-2 by  FDA under an Emergency Use Authorization (EUA). This EUA will remain  in effect (meaning this test can be used) for the duration of the  Covid-19 declaration under Section 564(b)(1) of the Act, 21  U.S.C. section 360bbb-3(b)(1), unless the authorization is  terminated or revoked. Performed at [facility], [address] [place], [place] [address]   MRSA PCR Screening     Status: None   Collection Time: 12/12/19 12:29 AM   Specimen: Nasopharyngeal  Result Value Ref Range Status   MRSA by PCR NEGATIVE NEGATIVE Final    Comment:        The GeneXpert MRSA Assay (FDA approved for NASAL specimens only), is one component of a comprehensive MRSA colonization surveillance program. It is not intended to diagnose MRSA infection nor to guide or monitor treatment for MRSA infections. Performed at [facility], [address] [place], [name] [address]   Aerobic/Anaerobic Culture (surgical/deep wound)     Status: None   Collection Time: 12/12/19  2:06 AM   Specimen: Sacral; Wound  Result Value Ref Range Status   Specimen Description   Final    SACRAL Performed at [facility], [address] [place], [place]  [address]    Special Requests   Final    NONE Performed at [facility], [place], [place] [address]    Gram Stain NO WBC SEEN NO ORGANISMS SEEN   Final   Culture   Final    FEW ESCHERICHIA COLI FEW CITROBACTER SPECIES FEW PROTEUS MIRABILIS FEW BACTEROIDES OVATUS BETA LACTAMASE POSITIVE Performed at [facility], [place] [address], [place], [place] [address]    Report Status 12/15/2019 FINAL  Final   Organism ID, Bacteria ESCHERICHIA COLI  Final   Organism ID, Bacteria CITROBACTER SPECIES  Final   Organism ID, Bacteria PROTEUS MIRABILIS  Final      Susceptibility   Citrobacter species - MIC*    CEFAZOLIN >=64 RESISTANT Resistant     CEFEPIME <=1 SENSITIVE Sensitive     CEFTAZIDIME >=64 RESISTANT Resistant     CEFTRIAXONE >=64 RESISTANT Resistant     CIPROFLOXACIN <=0.25 SENSITIVE Sensitive     GENTAMICIN <=1 SENSITIVE Sensitive     IMIPENEM <=0.25 SENSITIVE Sensitive     TRIMETH/SULFA <=20 SENSITIVE Sensitive     PIP/TAZO 64 INTERMEDIATE Intermediate     * FEW CITROBACTER SPECIES   Escherichia coli - MIC*    AMPICILLIN 16 INTERMEDIATE Intermediate     CEFAZOLIN >=64 RESISTANT Resistant     CEFEPIME <=1 SENSITIVE Sensitive  CEFTAZIDIME <=1 SENSITIVE Sensitive     CEFTRIAXONE <=1 SENSITIVE Sensitive     CIPROFLOXACIN <=0.25 SENSITIVE Sensitive     GENTAMICIN <=1 SENSITIVE Sensitive     IMIPENEM <=0.25 SENSITIVE Sensitive     TRIMETH/SULFA <=20 SENSITIVE Sensitive     AMPICILLIN/SULBACTAM 4 SENSITIVE Sensitive     PIP/TAZO <=4 SENSITIVE Sensitive     * FEW ESCHERICHIA COLI   Proteus mirabilis - MIC*    AMPICILLIN <=2 SENSITIVE Sensitive     CEFAZOLIN <=4 SENSITIVE Sensitive     CEFEPIME <=1 SENSITIVE Sensitive     CEFTAZIDIME <=1 SENSITIVE Sensitive     CEFTRIAXONE <=1 SENSITIVE Sensitive     CIPROFLOXACIN <=0.25 SENSITIVE Sensitive     GENTAMICIN 2 SENSITIVE Sensitive     IMIPENEM 4 SENSITIVE Sensitive     TRIMETH/SULFA <=20 SENSITIVE  Sensitive     AMPICILLIN/SULBACTAM <=2 SENSITIVE Sensitive     PIP/TAZO <=4 SENSITIVE Sensitive     * FEW PROTEUS MIRABILIS  Aerobic/Anaerobic Culture (surgical/deep wound)     Status: None   Collection Time: 12/12/19  2:06 AM   Specimen: Back; Wound  Result Value Ref Range Status   Specimen Description   Final    BACK LUMBAR SPINE Performed at [facility], [address] [place], [place] [address]    Special Requests   Final    NONE Performed at [facility], [place], [place] [address]    Gram Stain NO WBC SEEN NO ORGANISMS SEEN   Final   Culture   Final    FEW PROTEUS MIRABILIS NO ANAEROBES ISOLATED Performed at [facility], [place] [address] [place], [place] [address]    Report Status 12/17/2019 FINAL  Final   Organism ID, Bacteria PROTEUS MIRABILIS  Final      Susceptibility   Proteus mirabilis - MIC*    AMPICILLIN <=2 SENSITIVE Sensitive     CEFAZOLIN <=4 SENSITIVE Sensitive     CEFEPIME <=1 SENSITIVE Sensitive     CEFTAZIDIME <=1 SENSITIVE Sensitive     CEFTRIAXONE <=1 SENSITIVE Sensitive     CIPROFLOXACIN <=0.25 SENSITIVE Sensitive     GENTAMICIN 2 SENSITIVE Sensitive     IMIPENEM 4 SENSITIVE Sensitive     TRIMETH/SULFA <=20 SENSITIVE Sensitive     AMPICILLIN/SULBACTAM <=2 SENSITIVE Sensitive     PIP/TAZO <=4 SENSITIVE Sensitive     * FEW PROTEUS MIRABILIS  Aerobic/Anaerobic Culture (surgical/deep wound)     Status: None (Preliminary result)   Collection Time: 12/19/19  3:08 PM   Specimen: Abscess  Result Value Ref Range Status   Specimen Description   Final    ABSCESS Performed at [facility], [address] [place], [place] [address]    Special Requests   Final    NONE Performed at [facility], [address] [place], [place] [address]    Gram Stain   Final    ABUNDANT WBC PRESENT, PREDOMINANTLY PMN RARE GRAM POSITIVE RODS RARE GRAM NEGATIVE RODS Performed at [facility],  [place] [address], [place],  [address]    Culture   Final    FEW ESCHERICHIA COLI MODERATE PROTEUS MIRABILIS NO ANAEROBES ISOLATED; CULTURE IN PROGRESS FOR 5 DAYS    Report Status PENDING  Incomplete   Organism ID, Bacteria ESCHERICHIA COLI  Final   Organism ID, Bacteria PROTEUS MIRABILIS  Final      Susceptibility   Escherichia coli - MIC*    AMPICILLIN >=32  RESISTANT Resistant     CEFAZOLIN >=64 RESISTANT Resistant     CEFEPIME <=1 SENSITIVE Sensitive     CEFTAZIDIME >=64 RESISTANT Resistant     CEFTRIAXONE >=64 RESISTANT Resistant     CIPROFLOXACIN <=0.25 SENSITIVE Sensitive     GENTAMICIN <=1 SENSITIVE Sensitive     IMIPENEM 1 SENSITIVE Sensitive     TRIMETH/SULFA <=20 SENSITIVE Sensitive     AMPICILLIN/SULBACTAM >=32 RESISTANT Resistant     PIP/TAZO 64 INTERMEDIATE Intermediate     * FEW ESCHERICHIA COLI   Proteus mirabilis - MIC*    AMPICILLIN >=32 RESISTANT Resistant     CEFAZOLIN >=64 RESISTANT Resistant     CEFEPIME RESISTANT Resistant     CEFTAZIDIME >=64 RESISTANT Resistant     CEFTRIAXONE >=64 RESISTANT Resistant     CIPROFLOXACIN <=0.25 SENSITIVE Sensitive     GENTAMICIN <=1 SENSITIVE Sensitive     IMIPENEM 4 SENSITIVE Sensitive     TRIMETH/SULFA <=20 SENSITIVE Sensitive     AMPICILLIN/SULBACTAM <=2 SENSITIVE Sensitive     PIP/TAZO <=4 SENSITIVE Sensitive     * MODERATE PROTEUS MIRABILIS    Coagulation Studies: No results for input(s): LABPROT, INR in the last 72 hours.  Urinalysis: No results for input(s): COLORURINE, LABSPEC, PHURINE, GLUCOSEU, HGBUR, BILIRUBINUR, KETONESUR, PROTEINUR, UROBILINOGEN, NITRITE, LEUKOCYTESUR in the last 72 hours.  Invalid input(s): APPERANCEUR    Imaging: No results found.   Medications:   . sodium chloride Stopped (12/12/19 2140)  . albumin human 25 g (12/22/19 1215)  . feeding supplement (JEVITY 1.5 CAL/FIBER) 1,000 mL (12/22/19 1704)  . meropenem (MERREM) IV 500 mg (12/22/19 1724)   . chlorhexidine  15 mL Mouth  Rinse BID  . Chlorhexidine Gluconate Cloth  6 each Topical Q0600  . epoetin (EPOGEN/PROCRIT) injection  10,000 Units Intravenous Q T,Th,Sa-HD  . feeding supplement (PRO-STAT SUGAR FREE 64)  30 mL Per Tube BID  . levothyroxine  50 mcg Intravenous Daily  . mouth rinse  15 mL Mouth Rinse q12n4p  . midodrine  5 mg Per Tube TID WC  . pantoprazole (PROTONIX) IV  40 mg Intravenous Q24H  . phosphorus  500 mg Per Tube BID  . sodium chloride flush  10-40 mL Intracatheter Q12H   acetaminophen, docusate sodium, HYDROcodone-acetaminophen, ipratropium-albuterol, ondansetron (ZOFRAN) IV, polyethylene glycol, sodium chloride flush  Assessment/ Plan:   [name] is a 79 y.o. black female with end stage renal disease on hemodialysis, dementia, diabetes mellitus type II, hypertension, hypothyroidism, osteomyelitis, admitted to [facility] on 12/11/2019 for Encephalopathy acute [G93.40] Urinary tract infection, acute [N39.0] Osteomyelitis, unspecified site, unspecified type ([place]) [M86.9]  [facility] [address] TTS RIJ 79kg.  1.  ESRD on HD TTS: Hemodialysis yesterday with 4K bath. Schedule dialysis for tomorrow.   2.  Anemia of chronic kidney disease.  Hemoglobin 6.8 - transfusion as per hospitalist.  - EPO with HD treatment.  - Appreciate GI input.   3.  Secondary hyperparathyroidism: with hypophosphatemia - oral phosphorus replacement.  4. Hypotension: blood pressure stable off any anti-hypertensive agents.  - midodrine.   Overall prognosis is very poor despite aggressive measures. Appreciate palliative care input.    LOS: 12 [place] 5/21/202110:46 AM

## 2019-12-23 NOTE — H&P (View-Only) (Signed)
Despite being put on for a PEG tube placement today and having orders to be n.p.o. the patient continues to receive tube feedings.  I have discussed the case with Dr. Posey Pronto and we will make the patient n.p.o. after midnight tonight and have the procedure done tomorrow by Dr. Alice Reichert who is on-call.

## 2019-12-23 NOTE — Progress Notes (Signed)
Despite being put on for a PEG tube placement today and having orders to be n.p.o. the patient continues to receive tube feedings.  I have discussed the case with Dr. Posey Pronto and we will make the patient n.p.o. after midnight tonight and have the procedure done tomorrow by Dr. Alice Reichert who is on-call.

## 2019-12-23 NOTE — Progress Notes (Signed)
PT Cancellation Note  Patient Details Name: Taylor Tyler MRN: [id number] DOB: November 22, 1940   Cancelled Treatment:    Reason Eval/Treat Not Completed: Other (comment). Chart reviewed. Pt continues to not be appropriate or medically stable at this time to tolerate PT evaluation. K+ at 2.7 and pt is currently NPO for possible PEG placement next date. Due to multiple holds, will dc current order at this time. Please re-order once medically stable.   Adeliz Tonkinson 12/23/2019, 1:38 PM  Greggory Stallion, PT, DPT [phone]

## 2019-12-23 NOTE — Progress Notes (Signed)
PHARMACY CONSULT NOTE - FOLLOW UP  Pharmacy Consult for Electrolyte Monitoring and Replacement   Recent Labs: Potassium (mmol/L)  Date Value  12/23/2019 2.9 (L)  04/22/2014 4.0   Magnesium (mg/dL)  Date Value  12/23/2019 1.5 (L)  01/31/2014 1.5 (L)   Calcium (mg/dL)  Date Value  12/23/2019 7.9 (L)   Calcium, Total (mg/dL)  Date Value  04/22/2014 8.5   Albumin (g/dL)  Date Value  12/22/2019 1.6 (L)  01/27/2014 2.1 (L)   Phosphorus (mg/dL)  Date Value  12/23/2019 1.6 (L)   Sodium  Date Value  12/23/2019 139 mmol/L  04/10/2017 140  04/22/2014 140 mmol/L     Assessment: Patient is a 79yo female with complicated wound infection. Patient is ESRD and on hemodialysis. Patient now with multiple electrolyte abnormalities.  K 2.9, Mag 1.5, Phos 16  Goal of Therapy:  Maintain electrolytes WNL  Plan:  Will supplement carefully due to ESRD. Will order KPhos 10 mmol IV once and Mag Sulfate 2g IV once. Follow on AM labs.  Paulina Fusi, PharmD, BCPS 12/23/2019 3:02 PM

## 2019-12-23 NOTE — Progress Notes (Signed)
[facility] [place] NAME: Taylor Tyler    MR#:  [id number]  DATE OF BIRTH:  April 02, 1941  SUBJECTIVE:   Patient remains unresponsive. Does not open eyes. She was supposed to get back to peg tube  placement however tube feeding is still going on. REVIEW OF SYSTEMS:   Review of Systems  Unable to perform ROS: Mental status change   Tolerating Diet:No --on NG feeds Tolerating PT: bedbound  DRUG ALLERGIES:   Allergies  Allergen Reactions  . Clams [Shellfish Allergy] Swelling and Other (See Comments)    THROAT SWELLS NECK TURNS RED  . Hydralazine Other (See Comments)    CHEST TIGHTNESS Patient has tolerated multiple doses of hydralazine since allergy was listed   . Norvasc [Amlodipine Besylate] Swelling    Leg edema   . Milk-Related Compounds Diarrhea    VITALS:  Blood pressure (!) 153/69, pulse 87, temperature 98.7 F (37.1 C), temperature source Oral, resp. rate 15, height 5\' 6"  (1.676 m), weight 98.3 kg, SpO2 100 %.  PHYSICAL EXAMINATION:   Physical Exam  GENERAL:  79 y.o.-year-old patient lying in the bed with no acute distress. Obese, chornically ill EYES: Pupils equal, round, reactive to light and accommodation. No scleral icterus.   SKIN: Stage IV sacral decubitus ulcer, deep tissue injury of the left heel (present on admission) Pressure Injury 11/19/19 Sacrum Mid;Right Unstageable - Full thickness tissue loss in which the base of the injury is covered by slough (yellow, tan, gray, green or brown) and/or eschar (tan, brown or black) in the wound bed. deep wound with bed covered  (Active)  11/19/19 1800  Location: Sacrum  Location Orientation: Mid;Right  Staging: Unstageable - Full thickness tissue loss in which the base of the injury is covered by slough (yellow, tan, gray, green or brown) and/or eschar (tan, brown or black) in the wound bed.  Wound Description (Comments): deep wound with bed covered by slough  Present on  Admission: Yes     Pressure Injury 11/19/19 Heel Left Deep Tissue Pressure Injury - Purple or maroon localized area of discolored intact skin or blood-filled blister due to damage of underlying soft tissue from pressure and/or shear. (Active)  11/19/19 1800  Location: Heel  Location Orientation: Left  Staging: Deep Tissue Pressure Injury - Purple or maroon localized area of discolored intact skin or blood-filled blister due to damage of underlying soft tissue from pressure and/or shear.  Wound Description (Comments):   Present on Admission: Yes   EYES: EOMI ENT: NG tube in place CV: RRR PULM: CTA B ABD: soft, obese, NT, +BS CNS: She is awake but she does not follow any commands.  She moves her head from side to side to follow my movements in the room. EXT: Bilateral ankle edema, no tenderness  LABORATORY PANEL:  CBC Recent Labs  Lab 12/22/19 0741  WBC 11.9*  HGB 6.8*  HCT 24.5*  PLT 196    Chemistries  Recent Labs  Lab 12/20/19 0630 12/20/19 0630 12/22/19 0741  NA 138   < > 137  K 3.3*   < > 2.7*  CL 105   < > 102  CO2 25   < > 27  GLUCOSE 137*   < > 190*  BUN 20   < > 36*  CREATININE 2.30*   < > 2.27*  CALCIUM 8.1*   < > 7.8*  MG 1.9  --   --    < > = values  in this interval not displayed.   Cardiac Enzymes No results for input(s): TROPONINI in the last 168 hours. RADIOLOGY:  No results found. ASSESSMENT AND PLAN:  Taylor Tyler is an 79 y.o. female with a history ofend-stage renal disease, hypertension, diabetes, history of lumbar fusion, laminectomy 2014, with revision in 9179 complicated by wound infectiondue to Proteus  S/p septic shock/stage IV sacral decubitus wound infection with abscess and underlying osteomyelitis/Proteus and Citrobacter bacteremia -s/p debridement of necrotic sacral wound. -  S/p CT-guided aspiration of 20 mls posterior paraspinal fluid collection. -Continue IV meropenem.   -Follow-up with ID for further recommendations.  Acute  toxic/metabolic encephalopathy: - Patient is still nonverbal. Continue supportive care  Dysphagia: Patient is on enteral nutrition via NG tube.  - Plan of care was discussed with her son, [name].  He wants to go forward with PEG tube placement for enteral nutrition. -  Consulted gastroenterologist, Dr. Allen Norris to evaluate for PEG tube placement--unable to do it today since pt is not NPO and still getting TF. -Dr Allen Norris will d/w Dr Alice Reichert to do the procedure -NPO orders placed now -Hold heparin  Hypokalemia and hypophosphatemia: Replete potassium and phosphorus and monitor levels.  Discussed with nephrologist.  ESRD: Follow-up with nephrologist for hemodialysis.  Anemia of chronic disease: Continue epoetin.  Monitor H&H. Hypothyroidism: Continue IV Synthroid.  Unstageable sacral decubitus ulcer and left heel deep tissuinjury (present on admission): Continue local wound care per wound nurse's recommendation.  Turn patient every 2 hours.  overall poor prognosis long term  DVT prophylaxis: Heparin (on hold for PEG)  Code Status: Full code Family Communication: son calvin aware of plan--per Dr Mal Misty Disposition Plan: TBD   Status is: Inpatient  Remains inpatient appropriate because:Altered mental status, IV treatments appropriate due to intensity of illness or inability to take PO and Inpatient level of care appropriate due to severity of illness PEG placement pending   Dispo: The patient is from: SNF  Anticipated d/c is to: SNF  Anticipated d/c date is: > 3 days  Patient currently is not medically stable to d/c.    TOTAL TIME TAKING CARE OF THIS PATIENT: 25 minutes.  >50% time spent on counselling and coordination of care  Note: This dictation was prepared with Dragon dictation along with smaller phrase technology. Any transcriptional errors that result from this process are unintentional.  Fritzi Mandes M.D    [facility]    CC: Primary care physician; [place]-Scocuzza, Nino Glow, MDPatient ID: Taylor Tyler, female   DOB: 1940-12-29, 79 y.o.   MRN: [id number]

## 2019-12-23 NOTE — Progress Notes (Signed)
Pharmacy Antibiotic Note  MANEH SIEBEN is a 79 y.o. female admitted on 12/11/2019 with bacteremia.  Pharmacy has been consulted for Meropenem dosing.  Was on cipro perviously but d/c'd due to concern for possible encephalopathy.  Pt is on HD every T-Th-Sat.   Plan: Continue Meropenem 500 mg IV Q24H on 5/18 @ 1800.     Height: 5\' 6"  (167.6 cm) Weight: 98.3 kg (216 lb 11.4 oz) IBW/kg (Calculated) : 59.3  Temp (24hrs), Avg:98.3 F (36.8 C), Min:96.8 F (36 C), Max:99.2 F (37.3 C)  Recent Labs  Lab 12/17/19 1131 12/17/19 1131 12/18/19 0709 12/19/19 1036 12/20/19 0630 12/22/19 0741 12/23/19 1319  WBC 13.2*  --  11.7* 14.6* 12.7* 11.9*  --   CREATININE 2.07*   < > 1.38* 1.90* 2.30* 2.27* 1.83*   < > = values in this interval not displayed.    Estimated Creatinine Clearance: 30.9 mL/min (A) (by C-G formula based on SCr of 1.83 mg/dL (H)).    Allergies  Allergen Reactions  . Clams [Shellfish Allergy] Swelling and Other (See Comments)    THROAT SWELLS NECK TURNS RED  . Hydralazine Other (See Comments)    CHEST TIGHTNESS Patient has tolerated multiple doses of hydralazine since allergy was listed   . Norvasc [Amlodipine Besylate] Swelling    Leg edema   . Milk-Related Compounds Diarrhea    Antimicrobials this admission: 5/9 cefepime>>5/18 5/9 Flagyl>>5/18 5/18 cipro>>stopped d/t possible encephalopathy 5/18 Mero>>   Dose adjustments this admission:   Microbiology results: 5/9 BCx x2: proteus mirabilis, citrobacter youngae 5/10 Wound Cx (sacral): E.coli, citrobacter, proteus mirabilis, bacteroides ovatus 5/10 Wound Cx (lumbar spine): proteus mirabilis  Thank you for allowing pharmacy to be a part of this patient's care.  Paulina Fusi, PharmD, BCPS 12/23/2019 3:54 PM

## 2019-12-23 NOTE — Plan of Care (Signed)
  Problem: Education: Goal: Knowledge of General Education information will improve Description: Including pain rating scale, medication(s)/side effects and non-pharmacologic comfort measures Outcome: Progressing   Problem: Clinical Measurements: Goal: Ability to maintain clinical measurements within normal limits will improve Outcome: Progressing Goal: Will remain free from infection Outcome: Progressing Goal: Diagnostic test results will improve Outcome: Progressing   Problem: Activity: Goal: Risk for activity intolerance will decrease Outcome: Progressing   Problem: Coping: Goal: Level of anxiety will decrease Outcome: Progressing   Problem: Safety: Goal: Ability to remain free from injury will improve Outcome: Progressing

## 2019-12-24 ENCOUNTER — Encounter
Admission: EM | Disposition: A | Discharge status: Skilled Nursing Facility | Payer: Self-pay | Source: Home / Self Care | Attending: Internal Medicine

## 2019-12-24 ENCOUNTER — Encounter: Payer: Self-pay | Admitting: Internal Medicine

## 2019-12-24 ENCOUNTER — Ambulatory Visit: Admit: 2019-12-24 | Payer: Medicare Other | Admitting: Internal Medicine

## 2019-12-24 ENCOUNTER — Inpatient Hospital Stay: Payer: Medicare Other | Admitting: Anesthesiology

## 2019-12-24 DIAGNOSIS — Z931 Gastrostomy status: Secondary | ICD-10-CM | POA: Insufficient documentation

## 2019-12-24 HISTORY — PX: PEG PLACEMENT: SHX5437

## 2019-12-24 HISTORY — PX: ESOPHAGOGASTRODUODENOSCOPY: SHX1529

## 2019-12-24 LAB — PHOSPHORUS: Phosphorus: 2.8 mg/dL (ref 2.5–4.6)

## 2019-12-24 LAB — PREPARE RBC (CROSSMATCH)

## 2019-12-24 LAB — AEROBIC/ANAEROBIC CULTURE W GRAM STAIN (SURGICAL/DEEP WOUND)

## 2019-12-24 LAB — BASIC METABOLIC PANEL
Anion gap: 8 (ref 5–15)
BUN: 44 mg/dL — ABNORMAL HIGH (ref 8–23)
CO2: 29 mmol/L (ref 22–32)
Calcium: 7.9 mg/dL — ABNORMAL LOW (ref 8.9–10.3)
Chloride: 103 mmol/L (ref 98–111)
Creatinine, Ser: 1.97 mg/dL — ABNORMAL HIGH (ref 0.44–1.00)
GFR calc Af Amer: 28 mL/min — ABNORMAL LOW (ref >60)
GFR calc non Af Amer: 24 mL/min — ABNORMAL LOW (ref >60)
Glucose, Bld: 144 mg/dL — ABNORMAL HIGH (ref 70–99)
Potassium: 2.9 mmol/L — ABNORMAL LOW (ref 3.5–5.1)
Sodium: 140 mmol/L (ref 135–145)

## 2019-12-24 LAB — GLUCOSE, CAPILLARY
Glucose-Capillary: 145 mg/dL — ABNORMAL HIGH (ref 70–99)
Glucose-Capillary: 139 mg/dL — ABNORMAL HIGH (ref 70–99)
Glucose-Capillary: 120 mg/dL — ABNORMAL HIGH (ref 70–99)
Glucose-Capillary: 104 mg/dL — ABNORMAL HIGH (ref 70–99)
Glucose-Capillary: 94 mg/dL (ref 70–99)

## 2019-12-24 LAB — HEMOGLOBIN AND HEMATOCRIT, BLOOD
HCT: 34.4 % — ABNORMAL LOW (ref 36.0–46.0)
Hemoglobin: 10.1 g/dL — ABNORMAL LOW (ref 12.0–15.0)

## 2019-12-24 LAB — MAGNESIUM: Magnesium: 1.9 mg/dL (ref 1.7–2.4)

## 2019-12-24 SURGERY — INSERTION, PEG TUBE
Anesthesia: General

## 2019-12-24 MED ORDER — ONDANSETRON HCL 4 MG/2ML IJ SOLN: 4.0000 mg | Freq: Once | INTRAMUSCULAR | Status: DC | PRN

## 2019-12-24 MED ORDER — FENTANYL CITRATE (PF) 100 MCG/2ML IJ SOLN: 25.0000 ug | INTRAMUSCULAR | Status: DC | PRN

## 2019-12-24 MED ORDER — PROPOFOL 10 MG/ML IV BOLUS
INTRAVENOUS | Status: DC | PRN
  Administered 2019-12-24: 100 mg via INTRAVENOUS

## 2019-12-24 MED ORDER — LIDOCAINE HCL (CARDIAC) PF 100 MG/5ML IV SOSY
PREFILLED_SYRINGE | INTRAVENOUS | Status: DC | PRN
  Administered 2019-12-24: 100 mg via INTRAVENOUS

## 2019-12-24 MED ORDER — SEVOFLURANE IN SOLN
RESPIRATORY_TRACT | Status: AC
  Filled 2019-12-24: qty 250

## 2019-12-24 MED ORDER — SODIUM CHLORIDE 0.9% IV SOLUTION: Freq: Once | INTRAVENOUS | Status: AC

## 2019-12-24 MED ORDER — LACTATED RINGERS IV SOLN: INTRAVENOUS | Status: DC | PRN

## 2019-12-24 MED ORDER — SUCCINYLCHOLINE CHLORIDE 20 MG/ML IJ SOLN
INTRAMUSCULAR | Status: DC | PRN
  Administered 2019-12-24: 100 mg via INTRAVENOUS

## 2019-12-24 MED ORDER — ONDANSETRON HCL 4 MG/2ML IJ SOLN
INTRAMUSCULAR | Status: DC | PRN
  Administered 2019-12-24: 4 mg via INTRAVENOUS

## 2019-12-24 NOTE — Anesthesia Postprocedure Evaluation (Signed)
Anesthesia Post Note  Patient: Taylor Tyler  Procedure(s) Performed: PERCUTANEOUS ENDOSCOPIC GASTROSTOMY (PEG) PLACEMENT (N/A )  Patient location during evaluation: PACU Anesthesia Type: General Level of consciousness: awake and alert Pain management: pain level controlled Vital Signs Assessment: post-procedure vital signs reviewed and stable Respiratory status: spontaneous breathing, nonlabored ventilation and respiratory function stable Cardiovascular status: blood pressure returned to baseline and stable Postop Assessment: no apparent nausea or vomiting Anesthetic complications: no     Last Vitals:  Vitals:   12/24/19 1500 12/24/19 1515  BP: 103/65 97/63  Pulse:  (!) 115  Resp: 15 16  Temp:    SpO2:      Last Pain:  Vitals:   12/24/19 1445  TempSrc: Axillary  PainSc:                  Tera Mater

## 2019-12-24 NOTE — Progress Notes (Signed)
This note also relates to the following rows which could not be included: Pulse Rate - Cannot attach notes to unvalidated device data Resp - Cannot attach notes to unvalidated device data BP - Cannot attach notes to unvalidated device data  Hd completed  

## 2019-12-24 NOTE — Interval H&P Note (Signed)
History and Physical Interval Note:  12/24/2019 8:16 AM  Taylor Tyler  has presented today for surgery, with the diagnosis of Poor PO Intake.  The various methods of treatment have been discussed with the patient and family. After consideration of risks, benefits and other options for treatment, the patient has consented to  Procedure(s): PERCUTANEOUS ENDOSCOPIC GASTROSTOMY (PEG) PLACEMENT (N/A) as a surgical intervention.  The patient's history has been reviewed, patient examined, no change in status, stable for surgery.  I have reviewed the patient's chart and labs.  Questions were answered to the patient's satisfaction.     [place], [place]

## 2019-12-24 NOTE — Progress Notes (Signed)
This note also relates to the following rows which could not be included: Pulse Rate - Cannot attach notes to unvalidated device data SpO2 - Cannot attach notes to unvalidated device data  Hd started

## 2019-12-24 NOTE — Anesthesia Preprocedure Evaluation (Addendum)
Anesthesia Evaluation  Patient identified by MRN, date of birth, ID band Patient awake    Reviewed: Allergy & Precautions, H&P , NPO status , Patient's Chart, lab work & pertinent test results  History of Anesthesia Complications Negative for: history of anesthetic complications  Airway Mallampati: IV      Comment: Airway exam limited by pt's ability to fully comply Dental  (+) Chipped   Pulmonary neg COPD,    Pulmonary exam normal        Cardiovascular hypertension, (-) angina+CHF (HFpEF)  (-) Past MI Normal cardiovascular exam(-) dysrhythmias      Neuro/Psych PSYCHIATRIC DISORDERS Dementia Admitted 5/9 with acute toxic metabolic encephalopathy secondary to septic shock in the setting of UTI, chronic nonhealing sacral decubitus ulcer with possible osteomyelitis, remains admitted. Mental status has not returned to baseline and she is unable to maintain PO intake, now presenting for PEG placement.       GI/Hepatic negative GI ROS, Neg liver ROS,   Endo/Other  diabetesHypothyroidism   Renal/GU ESRF and DialysisRenal disease     Musculoskeletal   Abdominal   Peds  Hematology  (+) Blood dyscrasia, anemia ,   Anesthesia Other Findings O2: RA Access: 18G RUE, HD catheter R IJ, NG Gtt: none  Past Medical History: 03/14/2017: Acute blood loss anemia 03/14/2017: Acute renal failure superimposed on stage 3 chronic kidney  disease ([place]) 02/28/2017: AKI (acute kidney injury) ([place]) 03/29/2017: Altered mental status No date: Anemia No date: Aortic atherosclerosis ([place]) No date: Arthritis     Comment:  "joints might ache at times; not that bad" (06/10/2018) 03/04/2017: Bilateral lower extremity edema No date: Bradycardia No date: Chronic kidney disease     Comment:  ?? renal insufficiency,  01/15/2017: CKD (chronic kidney disease) stage 3, GFR 30-59 ml/min     Comment:  ?? renal insufficiency, which she thinks is coming from                "all these medications" No date: COVID-19     Comment:  10/17/19 No date: Dementia ([place]) No date: Diabetes mellitus without complication (HCC)     Comment:  diagnosed 4-5 yrs ago, 06/21/18- "that was years ago" No date: Disease of pancreas No date: Diverticulitis     Comment:  s/p perforation and partial colectomy 01/27/14 with 3               benign lymph nodes  01/15/2017: Diverticulosis No date: DJD (degenerative joint disease) 02/27/2017: DM (diabetes mellitus), type 2 with renal complications  (HCC) No date: Elevated ferritin level No date: Fatty liver No date: Hypertension No date: Hypothyroidism     Comment:  "had radiation" (06/10/2018) No date: Kidney disease, chronic, stage V (GFR under 15 ml/min) ([place])     Comment:  per notes from nursing home-she is on dialysis No date: Kidney stone 02/28/2017: Lethargy 02/27/2017: Obesity, Class III, BMI 40-49.9 (morbid obesity) ([place]) No date: Pleural lipoma 04/02/2017: Postoperative wound infection 01/15/2017: Spinal stenosis of lumbar region No date: Status post lumbar surgery No date: Wound healing, delayed     Comment:  back  Past Surgical History: 02/24/2017: ABDOMINAL EXPOSURE; N/A     Comment:  Procedure: ABDOMINAL EXPOSURE;  Surgeon: Angelia Mould, MD;  Location: [facility];  Service: Vascular;               Laterality: N/A; 1987: ABDOMINAL HYSTERECTOMY     Comment:  no h/o abnormal paps  02/24/2017: ANTERIOR LAT LUMBAR FUSION; N/A     Comment:  Procedure: Lumbar three- five Anterior lateral lumbar               interbody fusion;  Surgeon: Ditty, Kevan Ny, MD;                Location: [facility];  Service: Neurosurgery;  Laterality:               N/A;  L3-5 Anterior lateral lumbar interbody fusion with               removal of coflex at L3-4, L4-5 02/24/2017: ANTERIOR LUMBAR FUSION; N/A     Comment:  Procedure: Stage 1: Lumbar five-Sacral one Anterior               lumbar interbody fusion;   Surgeon: Ditty, Kevan Ny,              MD;  Location: [place];  Service: Neurosurgery;                Laterality: N/A;  Stage 1: L5-S1 Anterior lumbar               interbody fusion No date: APPENDECTOMY 1/44/8185: APPLICATION OF ROBOTIC ASSISTANCE FOR SPINAL PROCEDURE; N/A     Comment:  Procedure: APPLICATION OF ROBOTIC ASSISTANCE FOR SPINAL               PROCEDURE;  Surgeon: Ditty, Kevan Ny, MD;                Location: [place];  Service: Neurosurgery;  Laterality:               N/A; 6/31/4970: APPLICATION OF WOUND VAC; N/A     Comment:  Procedure: APPLICATION OF WOUND VAC;  Surgeon: Ditty,               Kevan Ny, MD;  Location: [place];  Service:               Neurosurgery;  Laterality: N/A; 26/37/8588: APPLICATION OF WOUND VAC; N/A     Comment:  Procedure: APPLICATION OF WOUND VAC;  Surgeon:               Consuella Lose, MD;  Location: [place];  Service:               Neurosurgery;  Laterality: N/A; 06/08/2019: AV FISTULA PLACEMENT; Left     Comment:  Procedure: Arteriovenous (Av) Fistula Creation;                Surgeon: Rosetta Posner, MD;  Location: [place];  Service:               Vascular;  Laterality: Left; 2013: BACK SURGERY No date: CATARACT EXTRACTION W/ INTRAOCULAR LENS IMPLANT; Right 1982: CHOLECYSTECTOMY OPEN 01/26/2014: COLON SURGERY     Comment:  desc.sigmoid colectomy and ventral hernia repair and               splenic flexure mobilization No date: COLON SURGERY No date: DILATION AND CURETTAGE OF UTERUS No date: HERNIA REPAIR 06/08/2019: INSERTION OF DIALYSIS CATHETER; Right     Comment:  Procedure: INSERTION OF DIALYSIS CATHETER RIGHT INTERNAL              JUGULAR;  Surgeon: Rosetta Posner, MD;  Location: [place];                Service:  Vascular;  Laterality: Right; 03/01/2013: LUMBAR LAMINECTOMY WITH SPINOUS PROCESS PLATE 2 LEVEL; N/A     Comment:  Procedure: LUMBAR LAMINECTOMY/DECOMPRESSION               MICRODISCECTOMY CoFlex;  Surgeon: Faythe Ghee, MD;                Location: [facility];  Service: Neurosurgery;                Laterality: N/A;  Lumbar three-four,Lumbar Four-Five               Laminectomy with Coflex 03/30/2017: LUMBAR WOUND DEBRIDEMENT; N/A     Comment:  Procedure: Lumbar wound exploration/debridement,               placement of wound vac;  Surgeon: Ditty, Kevan Ny,               MD;  Location: [place];  Service: Neurosurgery;                Laterality: N/A;  Lumbar wound exploration/debridement,               placement of wound vac 06/06/2018: LUMBAR WOUND DEBRIDEMENT; N/A     Comment:  Procedure: LUMBAR WOUND DEBRIDEMENT/EXPLORATION;                Surgeon: Consuella Lose, MD;  Location: [place];                Service: Neurosurgery;  Laterality: N/A; 06/22/2018: LUMBAR WOUND DEBRIDEMENT; N/A     Comment:  Procedure: SIMPLE INCISION AND DRAINAGE OF WOUND,               APPLICATION OF WOUND VAC;  Surgeon: Consuella Lose,               MD;  Location: [facility];  Service: Neurosurgery;                Laterality: N/A; 06/23/2019: LUMBAR WOUND DEBRIDEMENT; N/A     Comment:  Procedure: LUMBAR WOUND DEBRIDEMENT WITH HARDWARE               REMOVAL;  Surgeon: Consuella Lose, MD;  Location: [place];  Service: Neurosurgery;  Laterality: N/A; No date: PARTIAL COLECTOMY     Comment:  01/27/14 diverticulitis and 3 benign lymph nodes [facility] Dr.              Pat Patrick  1992: REDUCTION MAMMAPLASTY; Bilateral 06/08/2019: THROMBECTOMY BRACHIAL ARTERY; Left     Comment:  Procedure: Thrombectomy Brachial Artery;  Surgeon:               Rosetta Posner, MD;  Location: [facility];  Service: Vascular;               Laterality: Left; No date: TUBAL LIGATION 2014: VENTRAL HERNIA REPAIR  BMI    Body Mass Index: 34.98 kg/m      Reproductive/Obstetrics negative OB ROS                           Anesthesia Physical Anesthesia Plan  ASA: IV  Anesthesia Plan: General ETT   Post-op Pain  Management:    Induction:   PONV Risk Score and Plan: Ondansetron, Dexamethasone and Treatment may vary due to age or medical condition  Airway Management Planned:   Additional  Equipment:   Intra-op Plan:   Post-operative Plan:   Informed Consent: I have reviewed the patients History and Physical, chart, labs and discussed the procedure including the risks, benefits and alternatives for the proposed anesthesia with the patient or authorized representative who has indicated his/her understanding and acceptance.     Dental Advisory Given  Plan Discussed with: Anesthesiologist, CRNA and Surgeon  Anesthesia Plan Comments: (Phone consent obtained from son Saadia Dewitt.  Risks of anesthesia discussed and son states he would like to proceed.  KR)       Anesthesia Quick Evaluation

## 2019-12-24 NOTE — Brief Op Note (Signed)
Pt intubated by anesthesia and NG tube removed pre procedure

## 2019-12-24 NOTE — Progress Notes (Signed)
[facility] [place] NAME: Shauntae Reitman    MR#:  [id number]  DATE OF BIRTH:  April 05, 1941  SUBJECTIVE:   Patient opens eyes to verbal command. Did tell me good morning today. When asked where she is she nodded yes to hospital.   REVIEW OF SYSTEMS:   Review of Systems  Unable to perform ROS: Mental status change   Tolerating Diet:No --on NG feeds Tolerating PT: bedbound  DRUG ALLERGIES:   Allergies  Allergen Reactions  . Clams [Shellfish Allergy] Swelling and Other (See Comments)    THROAT SWELLS NECK TURNS RED  . Hydralazine Other (See Comments)    CHEST TIGHTNESS Patient has tolerated multiple doses of hydralazine since allergy was listed   . Norvasc [Amlodipine Besylate] Swelling    Leg edema   . Milk-Related Compounds Diarrhea    VITALS:  Blood pressure 138/75, pulse 85, temperature 98.2 F (36.8 C), temperature source Oral, resp. rate 15, height 5\' 6"  (1.676 m), weight 98.3 kg, SpO2 98 %.  PHYSICAL EXAMINATION:   Physical Exam  GENERAL:  79 y.o.-year-old patient lying in the bed with no acute distress. Obese, chornically ill EYES: Pupils equal, round, reactive to light and accommodation. No scleral icterus.   SKIN: Stage IV sacral decubitus ulcer, deep tissue injury of the left heel (present on admission) Pressure Injury 11/19/19 Sacrum Mid;Right Unstageable - Full thickness tissue loss in which the base of the injury is covered by slough (yellow, tan, gray, green or brown) and/or eschar (tan, brown or black) in the wound bed. deep wound with bed covered  (Active)  11/19/19 1800  Location: Sacrum  Location Orientation: Mid;Right  Staging: Unstageable - Full thickness tissue loss in which the base of the injury is covered by slough (yellow, tan, gray, green or brown) and/or eschar (tan, brown or black) in the wound bed.  Wound Description (Comments): deep wound with bed covered by slough  Present on Admission: Yes      Pressure Injury 11/19/19 Heel Left Deep Tissue Pressure Injury - Purple or maroon localized area of discolored intact skin or blood-filled blister due to damage of underlying soft tissue from pressure and/or shear. (Active)  11/19/19 1800  Location: Heel  Location Orientation: Left  Staging: Deep Tissue Pressure Injury - Purple or maroon localized area of discolored intact skin or blood-filled blister due to damage of underlying soft tissue from pressure and/or shear.  Wound Description (Comments):   Present on Admission: Yes   EYES: EOMI ENT: NG tube in place CV: RRR PULM: CTA B ABD: soft, obese, NT, +BS PEG+ CNS: She is awake but she does follow some commands.  EXT: Bilateral ankle edema, no tenderness  LABORATORY PANEL:  CBC Recent Labs  Lab 12/22/19 0741 12/22/19 0741 12/23/19 1445  WBC 11.9*  --   --   HGB 6.8*   < > 6.8*  HCT 24.5*  --   --   PLT 196  --   --    < > = values in this interval not displayed.    Chemistries  Recent Labs  Lab 12/24/19 0424  NA 140  K 2.9*  CL 103  CO2 29  GLUCOSE 144*  BUN 44*  CREATININE 1.97*  CALCIUM 7.9*  MG 1.9   Cardiac Enzymes No results for input(s): TROPONINI in the last 168 hours. RADIOLOGY:  No results found. ASSESSMENT AND PLAN:  CARLISIA GENO is an 79 y.o. female with a history ofend-stage  renal disease, hypertension, diabetes, history of lumbar fusion, laminectomy 2014, with revision in 2536 complicated by wound infectiondue to Proteus  S/p septic shock/stage IV sacral decubitus wound infection with abscess and underlying osteomyelitis/Proteus and Citrobacter bacteremia -s/p debridement of necrotic sacral wound. - S/p CT-guided aspiration of 20 mls posterior paraspinal fluid collection.  -Continue IV meropenem while in the hospital and patient will discharged on PO Cipro till 01/22/20 -appreciate ID recommendations.  Acute toxic/metabolic encephalopathy: - Patient is still nonverbal-- said couple words  today. - Continue supportive care  Dysphagia:  -Patient is on enteral nutrition via NG tube.  -Plan of care was discussed with her son, [name].  He wants to go forward with PEG tube placement for enteral nutrition. - Consulted gastroenterologist, Dr. Allen Norris  -status post peg tube placement by Dr. Alice Reichert today  Hypokalemia and hypophosphatemia:  Replete potassium and phosphorus and monitor levels.    ESRD: Follow-up with nephrologist for hemodialysis. -Patient will need to set up for dialysis.  Anemia of chronic disease: Continue epoetin.  Monitor H&H. -Hemoglobin down to 6.8--- transfused one unit today--son Ollen Gross aware  Hypothyroidism: Continue IV Synthroid-- change to oral from tomorrow  Unstageable sacral decubitus ulcer and left heel deep tissuinjury (present on admission): Continue local wound care per wound nurse's recommendation.  Turn patient every 2 hours.  overall poor prognosis long term  DVT prophylaxis: Heparin Code Status: Full code Family Communication: son Ollen Gross on the phone today Disposition Plan: TBD   Status is: Inpatient  Remains inpatient appropriate because:Altered mental status, IV treatments appropriate due to intensity of illness or inability to take PO and Inpatient level of care appropriate due to severity of illness PEG placement done--needs to be started on feeding from 5/23. Pt will alos need to sit up for HD--quiet challenging with pt's mentation   Dispo: The patient is from: SNF  Anticipated d/c is to: SNF  Anticipated d/c date is: unsure  Patient currently is not medically stable to d/c.   TOTAL TIME TAKING CARE OF THIS PATIENT: 25 minutes.  >50% time spent on counselling and coordination of care  Note: This dictation was prepared with Dragon dictation along with smaller phrase technology. Any transcriptional errors that result from this process are unintentional.  Fritzi Mandes M.D    [facility]   CC: Primary care physician; McLean-Scocuzza, Nino Glow, MDPatient ID: Bo Merino, female   DOB: [birth year], 79 y.o.   MRN: [id number]

## 2019-12-24 NOTE — Anesthesia Procedure Notes (Signed)
Procedure Name: Intubation Date/Time: 12/24/2019 8:30 AM Performed by: Justus Memory, CRNA Pre-anesthesia Checklist: Patient identified, Patient being monitored, Timeout performed, Emergency Drugs available and Suction available Patient Re-evaluated:Patient Re-evaluated prior to induction Oxygen Delivery Method: Circle system utilized Preoxygenation: Pre-oxygenation with 100% oxygen Induction Type: IV induction, Rapid sequence and Cricoid Pressure applied Laryngoscope Size: 3 and McGraph Grade View: Grade I Tube type: Oral Tube size: 7.0 mm Number of attempts: 1 Airway Equipment and Method: Stylet and Video-laryngoscopy Placement Confirmation: ETT inserted through vocal cords under direct vision,  positive ETCO2,  breath sounds checked- equal and bilateral and CO2 detector Secured at: 21 cm Tube secured with: Tape Dental Injury: Teeth and Oropharynx as per pre-operative assessment  Difficulty Due To: Difficulty was anticipated and Difficult Airway- due to large tongue Future Recommendations: Recommend- induction with short-acting agent, and alternative techniques readily available

## 2019-12-24 NOTE — Transfer of Care (Signed)
Immediate Anesthesia Transfer of Care Note  Patient: Taylor Tyler  Procedure(s) Performed: PERCUTANEOUS ENDOSCOPIC GASTROSTOMY (PEG) PLACEMENT (N/A )  Patient Location: PACU  Anesthesia Type:General  Level of Consciousness: sedated  Airway & Oxygen Therapy: Patient Spontanous Breathing and Patient connected to face mask oxygen  Post-op Assessment: Report given to RN and Post -op Vital signs reviewed and stable  Post vital signs: Reviewed and stable  Last Vitals:  Vitals Value Taken Time  BP 136/55 12/24/19 0955  Temp 36.3 C 12/24/19 0924  Pulse 95 12/24/19 0956  Resp 21 12/24/19 0956  SpO2 95 % 12/24/19 0956  Vitals shown include unvalidated device data.  Last Pain:  Vitals:   12/24/19 0815  TempSrc: Temporal  PainSc: 0-No pain      Patients Stated Pain Goal: 4 (68/34/19 6222)  Complications: No apparent anesthesia complications

## 2019-12-24 NOTE — Progress Notes (Signed)
Taylor Tyler  MRN: [id number]  DOB/AGE: Oct 02, 1940 79 y.o.  Primary Care Physician:McLean-Scocuzza, Nino Glow, MD  Admit date: 12/11/2019  Chief Complaint:  Chief Complaint  Patient presents with  . Code Sepsis    S-Pt presented on  12/11/2019 with  Chief Complaint  Patient presents with  . Code Sepsis  . Patient is lying comfortably on the bed. Patient does open her eyes but does not offer any concerns  Medications . sodium chloride   Intravenous Once  . chlorhexidine  15 mL Mouth Rinse BID  . Chlorhexidine Gluconate Cloth  6 each Topical Q0600  . epoetin (EPOGEN/PROCRIT) injection  10,000 Units Intravenous Q T,Th,Sa-HD  . feeding supplement (PRO-STAT SUGAR FREE 64)  30 mL Per Tube BID  . insulin aspart  0-5 Units Subcutaneous QHS  . insulin aspart  0-9 Units Subcutaneous TID WC  . levothyroxine  50 mcg Intravenous Daily  . mouth rinse  15 mL Mouth Rinse q12n4p  . midodrine  5 mg Per Tube TID WC  . pantoprazole (PROTONIX) IV  40 mg Intravenous Q24H  . phosphorus  500 mg Per Tube BID  . sodium chloride flush  10-40 mL Intracatheter Q12H         ROS: Unable to offer any complaints   Physical Exam: Vital signs in last 24 hours: Temp:  [97.1 F (36.2 C)-98.7 F (37.1 C)] 98.2 F (36.8 C) (05/22 1029) Pulse Rate:  [84-90] 85 (05/22 1029) Resp:  [8-17] 15 (05/22 1029) BP: (111-151)/(52-75) 138/75 (05/22 1029) SpO2:  [97 %-100 %] 98 % (05/22 1029) Weight change:  Last BM Date: 12/24/19  Intake/Output from previous day: 05/21 0701 - 05/22 0700 In: 4.1 [I.V.:3; IV Piggyback:1.1] Out: -  Total I/O In: 400 [I.V.:400] Out: 2 [Blood:2]   Physical Exam: General-patient is critically ill appearing, lethargic but arousable Resp- No acute REsp distress,  NO Rhonchi CVS- S1S2 regular in rate and rhythm GIT- BS+, soft, NT, ND EXT- NO LE Edema, Cyanosis Access patient has right-sided tunneled catheter in situ-IJ   Lab Results: CBC Recent Labs    12/22/19 0741  12/23/19 1445  WBC 11.9*  --   HGB 6.8* 6.8*  HCT 24.5*  --   PLT 196  --     BMET Recent Labs    12/23/19 1319 12/24/19 0424  NA 139 140  K 2.9* 2.9*  CL 101 103  CO2 29 29  GLUCOSE 204* 144*  BUN 35* 44*  CREATININE 1.83* 1.97*  CALCIUM 7.9* 7.9*    MICRO Recent Results (from the past 240 hour(s))  Aerobic/Anaerobic Culture (surgical/deep wound)     Status: None   Collection Time: 12/19/19  3:08 PM   Specimen: Abscess  Result Value Ref Range Status   Specimen Description   Final    ABSCESS Performed at [facility], [address], [place], [place] [address]    Special Requests   Final    NONE Performed at [facility], [address] [place], [place] [address]    Gram Stain   Final    ABUNDANT WBC PRESENT, PREDOMINANTLY PMN RARE GRAM POSITIVE RODS RARE GRAM NEGATIVE RODS    Culture   Final    FEW ESCHERICHIA COLI MODERATE PROTEUS MIRABILIS NO ANAEROBES ISOLATED Performed at [facility], [place] [address] [place], [place] [address]    Report Status 12/24/2019 FINAL  Final   Organism ID, Bacteria ESCHERICHIA COLI  Final   Organism ID, Bacteria PROTEUS MIRABILIS  Final      Susceptibility   Escherichia coli - MIC*    AMPICILLIN >=32 RESISTANT Resistant     CEFAZOLIN >=64 RESISTANT Resistant     CEFEPIME <=1 SENSITIVE Sensitive     CEFTAZIDIME >=64 RESISTANT Resistant     CEFTRIAXONE >=64 RESISTANT Resistant     CIPROFLOXACIN <=0.25 SENSITIVE Sensitive     GENTAMICIN <=1 SENSITIVE Sensitive     IMIPENEM 1 SENSITIVE Sensitive     TRIMETH/SULFA <=20 SENSITIVE Sensitive     AMPICILLIN/SULBACTAM >=32 RESISTANT Resistant     PIP/TAZO 64 INTERMEDIATE Intermediate     * FEW ESCHERICHIA COLI   Proteus mirabilis - MIC*    AMPICILLIN >=32 RESISTANT Resistant     CEFAZOLIN >=64 RESISTANT Resistant     CEFEPIME RESISTANT Resistant     CEFTAZIDIME >=64 RESISTANT Resistant     CEFTRIAXONE >=64 RESISTANT Resistant     CIPROFLOXACIN  <=0.25 SENSITIVE Sensitive     GENTAMICIN <=1 SENSITIVE Sensitive     IMIPENEM 4 SENSITIVE Sensitive     TRIMETH/SULFA <=20 SENSITIVE Sensitive     AMPICILLIN/SULBACTAM <=2 SENSITIVE Sensitive     PIP/TAZO <=4 SENSITIVE Sensitive     * MODERATE PROTEUS MIRABILIS      Lab Results  Component Value Date   PTH 68 (H) 12/13/2019   CALCIUM 7.9 (L) 12/24/2019   CAION 1.11 (L) 06/23/2019   PHOS 2.8 12/24/2019               Impression:   Patient is a 79 year old African-American female with a past medical history of end-stage renal disease on hemodialysis-Tuesday Thursday Saturday schedule, dementia, diabetes mellitus type 2, hypertension, hypothyroidism who was admitted to the hospital with chief complaint of altered mental status and sepsis  1)Renal end-stage renal disease on hemodialysis. Patient is on Tuesday Thursday Saturday schedule as an outpatient Patient is an outpatient is under the care of [place] [facility] ([place] location. Patient will be dialyzed today    2) hypotension Patient was earlier on pressors Patient is now off pressors Patient blood pressure is stable  3)Anemia of chronic disease  HGb is not at goal (9--11) Patient is on  Epogen  4) secondary hyperparathyroidism -CKD Mineral-Bone Disorder   Secondary Hyperparathyroidism present. Phosphorus at goal.   5) sepsis Patient is clinically better Patient is on cefepime/Flagyl   6) electrolytes   sodium Hyponatremia-secondary to ESRD No better  potassium Hypokalemia-we will use 4K bath    7)Acid base Co2 at goal     Plan:   We will dialyze patient today We will use 4K bath We will keep patient on Epogen    Jonathan [facility] 12/24/2019, 11:39 AM

## 2019-12-24 NOTE — Progress Notes (Signed)
PHARMACY CONSULT NOTE - FOLLOW UP  Pharmacy Consult for Electrolyte Monitoring and Replacement   Recent Labs: Potassium (mmol/L)  Date Value  12/24/2019 2.9 (L)  04/22/2014 4.0   Magnesium (mg/dL)  Date Value  12/24/2019 1.9  01/31/2014 1.5 (L)   Calcium (mg/dL)  Date Value  12/24/2019 7.9 (L)   Calcium, Total (mg/dL)  Date Value  04/22/2014 8.5   Albumin (g/dL)  Date Value  12/22/2019 1.6 (L)  01/27/2014 2.1 (L)   Phosphorus (mg/dL)  Date Value  12/24/2019 2.8   Sodium  Date Value  12/24/2019 140 mmol/L  04/10/2017 140  04/22/2014 140 mmol/L     Assessment: Patient is a 79yo female with complicated wound infection. Patient is ESRD and on hemodialysis. Patient now with multiple electrolyte abnormalities.  K 2.9, Mag 1.5, Phos 16  Goal of Therapy:  Maintain electrolytes WNL  Plan:  Potassium low this morning. Will defer replacement for now as patient is scheduled for HD and will likely receive correction with appropriate K bath during HD session. Continue to follow electrolytes.  Dorena Bodo, PharmD 12/24/2019 8:12 AM

## 2019-12-25 LAB — BASIC METABOLIC PANEL
Anion gap: 9 (ref 5–15)
BUN: 27 mg/dL — ABNORMAL HIGH (ref 8–23)
CO2: 28 mmol/L (ref 22–32)
Calcium: 8 mg/dL — ABNORMAL LOW (ref 8.9–10.3)
Chloride: 103 mmol/L (ref 98–111)
Creatinine, Ser: 1.52 mg/dL — ABNORMAL HIGH (ref 0.44–1.00)
GFR calc Af Amer: 38 mL/min — ABNORMAL LOW (ref >60)
GFR calc non Af Amer: 33 mL/min — ABNORMAL LOW (ref >60)
Glucose, Bld: 85 mg/dL (ref 70–99)
Potassium: 4.1 mmol/L (ref 3.5–5.1)
Sodium: 140 mmol/L (ref 135–145)

## 2019-12-25 LAB — TYPE AND SCREEN
ABO/RH(D): A POS
Antibody Screen: NEGATIVE
Unit division: 0

## 2019-12-25 LAB — PHOSPHORUS: Phosphorus: 3.1 mg/dL (ref 2.5–4.6)

## 2019-12-25 LAB — GLUCOSE, CAPILLARY
Glucose-Capillary: 81 mg/dL (ref 70–99)
Glucose-Capillary: 94 mg/dL (ref 70–99)
Glucose-Capillary: 90 mg/dL (ref 70–99)
Glucose-Capillary: 110 mg/dL — ABNORMAL HIGH (ref 70–99)

## 2019-12-25 LAB — BPAM RBC
Blood Product Expiration Date: 202105252359
ISSUE DATE / TIME: 202105221427
Unit Type and Rh: 6200

## 2019-12-25 LAB — MAGNESIUM: Magnesium: 1.8 mg/dL (ref 1.7–2.4)

## 2019-12-25 MED ORDER — PANTOPRAZOLE SODIUM 40 MG PO PACK
40.0000 mg | PACK | Freq: Every day | ORAL | Status: DC
  Administered 2019-12-25 – 2019-12-30 (×5): 40 mg
  Filled 2019-12-25 (×6): qty 20

## 2019-12-25 MED ORDER — LEVOTHYROXINE SODIUM 100 MCG PO TABS
100.0000 ug | ORAL_TABLET | Freq: Every day | ORAL | Status: DC
  Administered 2019-12-25 – 2019-12-30 (×6): 100 ug
  Filled 2019-12-25 (×6): qty 1

## 2019-12-25 NOTE — Progress Notes (Signed)
Taylor Tyler  MRN: [id number]  DOB/AGE: Jul 09, 1941 79 y.o.  Primary Care Physician:McLean-Scocuzza, Nino Glow, MD  Admit date: 12/11/2019  Chief Complaint:  Chief Complaint  Patient presents with  . Code Sepsis    S-Pt presented on  12/11/2019 with  Chief Complaint  Patient presents with  . Code Sepsis  .  Patient does open her eyes but does not offer any concerns  Medications . chlorhexidine  15 mL Mouth Rinse BID  . Chlorhexidine Gluconate Cloth  6 each Topical Q0600  . epoetin (EPOGEN/PROCRIT) injection  10,000 Units Intravenous Q T,Th,Sa-HD  . feeding supplement (PRO-STAT SUGAR FREE 64)  30 mL Per Tube BID  . insulin aspart  0-5 Units Subcutaneous QHS  . insulin aspart  0-9 Units Subcutaneous TID WC  . levothyroxine  100 mcg Per Tube Q0600  . mouth rinse  15 mL Mouth Rinse q12n4p  . midodrine  5 mg Per Tube TID WC  . pantoprazole sodium  40 mg Per Tube Daily  . phosphorus  500 mg Per Tube BID  . sodium chloride flush  10-40 mL Intracatheter Q12H         ROS: Unable to offer any complaints   Physical Exam: Vital signs in last 24 hours: Temp:  [97.6 F (36.4 C)-98.6 F (37 C)] 98.2 F (36.8 C) (05/23 0720) Pulse Rate:  [80-115] 85 (05/23 0720) Resp:  [11-18] 18 (05/23 0720) BP: (54-156)/(30-76) 155/63 (05/23 0720) SpO2:  [97 %-100 %] 100 % (05/23 0720) Weight:  [89.4 kg] 89.4 kg (05/23 0500) Weight change:  Last BM Date: 12/24/19  Intake/Output from previous day: 05/22 0701 - 05/23 0700 In: 744.2 [I.V.:403.1; IV Piggyback:191.1] Out: 30 [Stool:1; Blood:2] Total I/O In: 73 [Other:90] Out: -    Physical Exam: General-patient is critically ill appearing, lethargic but arousable Resp- No acute REsp distress,  NO Rhonchi CVS- S1S2 regular in rate and rhythm GIT- BS+, soft, NT, ND, PEG tube in situ EXT- NO LE Edema, Cyanosis Access patient has right-sided tunneled catheter in situ-IJ   Lab Results: CBC Recent Labs    12/23/19 1445 12/24/19 1745   HGB 6.8* 10.1*  HCT  --  34.4*    BMET Recent Labs    12/24/19 0424 12/25/19 0514  NA 140 140  K 2.9* 4.1  CL 103 103  CO2 29 28  GLUCOSE 144* 85  BUN 44* 27*  CREATININE 1.97* 1.52*  CALCIUM 7.9* 8.0*    MICRO Recent Results (from the past 240 hour(s))  Aerobic/Anaerobic Culture (surgical/deep wound)     Status: None   Collection Time: 12/19/19  3:08 PM   Specimen: Abscess  Result Value Ref Range Status   Specimen Description   Final    ABSCESS Performed at [facility], [address] [place], [place] [address]    Special Requests   Final    NONE Performed at [facility], [address] [place], [place] [address]    Gram Stain   Final    ABUNDANT WBC PRESENT, PREDOMINANTLY PMN RARE GRAM POSITIVE RODS RARE GRAM NEGATIVE RODS    Culture   Final    FEW ESCHERICHIA COLI MODERATE PROTEUS MIRABILIS NO ANAEROBES ISOLATED Performed at [facility], [place] [address] [place], [place] [address]    Report Status 12/24/2019 FINAL  Final   Organism ID, Bacteria ESCHERICHIA COLI  Final   Organism ID, Bacteria PROTEUS MIRABILIS  Final      Susceptibility   Escherichia coli -  MIC*    AMPICILLIN >=32 RESISTANT Resistant     CEFAZOLIN >=64 RESISTANT Resistant     CEFEPIME <=1 SENSITIVE Sensitive     CEFTAZIDIME >=64 RESISTANT Resistant     CEFTRIAXONE >=64 RESISTANT Resistant     CIPROFLOXACIN <=0.25 SENSITIVE Sensitive     GENTAMICIN <=1 SENSITIVE Sensitive     IMIPENEM 1 SENSITIVE Sensitive     TRIMETH/SULFA <=20 SENSITIVE Sensitive     AMPICILLIN/SULBACTAM >=32 RESISTANT Resistant     PIP/TAZO 64 INTERMEDIATE Intermediate     * FEW ESCHERICHIA COLI   Proteus mirabilis - MIC*    AMPICILLIN >=32 RESISTANT Resistant     CEFAZOLIN >=64 RESISTANT Resistant     CEFEPIME RESISTANT Resistant     CEFTAZIDIME >=64 RESISTANT Resistant     CEFTRIAXONE >=64 RESISTANT Resistant     CIPROFLOXACIN <=0.25 SENSITIVE Sensitive     GENTAMICIN <=1  SENSITIVE Sensitive     IMIPENEM 4 SENSITIVE Sensitive     TRIMETH/SULFA <=20 SENSITIVE Sensitive     AMPICILLIN/SULBACTAM <=2 SENSITIVE Sensitive     PIP/TAZO <=4 SENSITIVE Sensitive     * MODERATE PROTEUS MIRABILIS      Lab Results  Component Value Date   PTH 68 (H) 12/13/2019   CALCIUM 8.0 (L) 12/25/2019   CAION 1.11 (L) 06/23/2019   PHOS 3.1 12/25/2019               Impression:   Patient is a 79 year old African-American female with a past medical history of end-stage renal disease on hemodialysis-Tuesday Thursday Saturday schedule, dementia, diabetes mellitus type 2, hypertension, hypothyroidism who was admitted to the hospital with chief complaint of altered mental status and sepsis  1)Renal end-stage renal disease on hemodialysis. Patient is on Tuesday Thursday Saturday schedule as an outpatient Patient is an outpatient is under the care of [place] [facility] ([place] location. Patient was last dialyzed yesterday No need for renal placement therapy today   2) hypotension Patient was earlier on pressors Patient is now off pressors Patient blood pressure is stable  3)Anemia of chronic disease  HGb is at goal (9--11) Patient did receive PRBC Hemoglobin now better Patient is on  Epogen  4) secondary hyperparathyroidism -CKD Mineral-Bone Disorder   Secondary Hyperparathyroidism present. Phosphorus at goal.   5) sepsis Patient is now clinically better    6) electrolytes   sodium Hyponatremia-secondary to ESRD Now better  potassium Hypokalemia-now better    7)Acid base Co2 at goal     Plan:   No need for renal placement therapy today    Khadejah Son s [facility] 12/25/2019, 10:24 AM

## 2019-12-25 NOTE — Progress Notes (Signed)
[facility] Gastroenterology Inpatient Progress Note  Subjective: Patient seen for follow up of PEG tube placement yesterday. Appears well, NAD. Somewhat more verbal. Responds "I think I'm alright" when asked how she is doing. Still significant psychomotor slowing.  Objective: Vital signs in last 24 hours: Temp:  [97.6 F (36.4 C)-98.6 F (37 C)] 98.2 F (36.8 C) (05/23 0720) Pulse Rate:  [80-115] 85 (05/23 0720) Resp:  [11-18] 18 (05/23 0720) BP: (54-156)/(30-76) 155/63 (05/23 0720) SpO2:  [97 %-100 %] 100 % (05/23 0720) Weight:  [89.4 kg] 89.4 kg (05/23 0500) Blood pressure (!) 155/63, pulse 85, temperature 98.2 F (36.8 C), temperature source Axillary, resp. rate 18, height 5\' 6"  (1.676 m), weight 89.4 kg, SpO2 100 %.    Intake/Output from previous day: 05/22 0701 - 05/23 0700 In: 744.2 [I.V.:403.1; IV Piggyback:191.1] Out: 549 [Stool:1; Blood:2]  Intake/Output this shift: Total I/O In: 90 [Other:90] Out: -    General appearance:  Alert, flat affect. Resp:  CTA Cardio:  RRR, No gallop GI:  Abdomen obese, PEG tube affixed with gauze and tape to abdominal wall. Tube feedings running at 43ml/hr currently. Non-tender, no induration or leaking of feeds. Tube at 4cm at abdominal incision now.  Extremities:  1+ edema.   Lab Results: Results for orders placed or performed during the hospital encounter of 12/11/19 (from the past 24 hour(s))  Glucose, capillary     Status: Abnormal   Collection Time: 12/24/19  5:15 PM  Result Value Ref Range   Glucose-Capillary 104 (H) 70 - 99 mg/dL  Hemoglobin and hematocrit, blood     Status: Abnormal   Collection Time: 12/24/19  5:45 PM  Result Value Ref Range   Hemoglobin 10.1 (L) 12.0 - 15.0 g/dL   HCT 34.4 (L) 36.0 - 46.0 %  Glucose, capillary     Status: None   Collection Time: 12/24/19  8:53 PM  Result Value Ref Range   Glucose-Capillary 94 70 - 99 mg/dL  Basic metabolic panel     Status: Abnormal   Collection Time: 12/25/19   5:14 AM  Result Value Ref Range   Sodium 140 135 - 145 mmol/L   Potassium 4.1 3.5 - 5.1 mmol/L   Chloride 103 98 - 111 mmol/L   CO2 28 22 - 32 mmol/L   Glucose, Bld 85 70 - 99 mg/dL   BUN 27 (H) 8 - 23 mg/dL   Creatinine, Ser 1.52 (H) 0.44 - 1.00 mg/dL   Calcium 8.0 (L) 8.9 - 10.3 mg/dL   GFR calc non Af Amer 33 (L) >60 mL/min   GFR calc Af Amer 38 (L) >60 mL/min   Anion gap 9 5 - 15  Magnesium     Status: None   Collection Time: 12/25/19  5:14 AM  Result Value Ref Range   Magnesium 1.8 1.7 - 2.4 mg/dL  Phosphorus     Status: None   Collection Time: 12/25/19  5:14 AM  Result Value Ref Range   Phosphorus 3.1 2.5 - 4.6 mg/dL  Glucose, capillary     Status: None   Collection Time: 12/25/19  7:25 AM  Result Value Ref Range   Glucose-Capillary 81 70 - 99 mg/dL  Glucose, capillary     Status: None   Collection Time: 12/25/19 11:47 AM  Result Value Ref Range   Glucose-Capillary 94 70 - 99 mg/dL     Recent Labs    12/23/19 1445 12/24/19 1745  HGB 6.8* 10.1*  HCT  --  34.4*  BMET Recent Labs    12/23/19 1319 12/24/19 0424 12/25/19 0514  NA 139 140 140  K 2.9* 2.9* 4.1  CL 101 103 103  CO2 29 29 28   GLUCOSE 204* 144* 85  BUN 35* 44* 27*  CREATININE 1.83* 1.97* 1.52*  CALCIUM 7.9* 7.9* 8.0*   LFT No results for input(s): PROT, ALBUMIN, AST, ALT, ALKPHOS, BILITOT, BILIDIR, IBILI in the last 72 hours. PT/INR No results for input(s): LABPROT, INR in the last 72 hours. Hepatitis Panel No results for input(s): HEPBSAG, HCVAB, HEPAIGM, HEPBIGM in the last 72 hours. C-Diff No results for input(s): CDIFFTOX in the last 72 hours. No results for input(s): CDIFFPCR in the last 72 hours.   Studies/Results: No results found.  Scheduled Inpatient Medications:   . chlorhexidine  15 mL Mouth Rinse BID  . Chlorhexidine Gluconate Cloth  6 each Topical Q0600  . epoetin (EPOGEN/PROCRIT) injection  10,000 Units Intravenous Q T,Th,Sa-HD  . feeding supplement (PRO-STAT SUGAR  FREE 64)  30 mL Per Tube BID  . insulin aspart  0-5 Units Subcutaneous QHS  . insulin aspart  0-9 Units Subcutaneous TID WC  . levothyroxine  100 mcg Per Tube Q0600  . mouth rinse  15 mL Mouth Rinse q12n4p  . midodrine  5 mg Per Tube TID WC  . pantoprazole sodium  40 mg Per Tube Daily  . phosphorus  500 mg Per Tube BID  . sodium chloride flush  10-40 mL Intracatheter Q12H    Continuous Inpatient Infusions:   . sodium chloride Stopped (12/24/19 1832)  . albumin human Stopped (12/22/19 1346)  . feeding supplement (JEVITY 1.5 CAL/FIBER) 1,000 mL (12/25/19 0951)  . meropenem (MERREM) IV Stopped (12/24/19 1815)    PRN Inpatient Medications:  acetaminophen, docusate sodium, HYDROcodone-acetaminophen, ipratropium-albuterol, ondansetron (ZOFRAN) IV, polyethylene glycol, sodium chloride flush    Assessment:  1. Altered mental status 2. Toxic/metabolic encephalopathy. 3. Sacral decubitus ulcer - Tx per ID, Dr. Delaine Lame. 4. S/p PEG tube - no obvious complications, tube appears well placed and functional.  Plan:  1. Continue feeds as per dietician. PEG care as prescribed.  2. GI sign off. Call us back if we can help.  Kristell Wooding K. Alice Reichert, M.D. 12/25/2019, 12:36 PM

## 2019-12-25 NOTE — Progress Notes (Signed)
 Brief Nutrition Note  Consult received for enteral/tube feeding initiation and management post PEG placement.   Received paged from MD regarding reinitiation of TF.  Resume Jevity 1.5 at 20 ml/hr today via PEG and titrate by 10 mL q 6 hours back to previous goal rate of 50 ml/hr   Labs:  Recent Labs  Lab 12/23/19 1319 12/24/19 0424 12/25/19 0514  NA 139 140 140  K 2.9* 2.9* 4.1  CL 101 103 103  CO2 29 29 28   BUN 35* 44* 27*  CREATININE 1.83* 1.97* 1.52*  CALCIUM 7.9* 7.9* 8.0*  MG 1.5* 1.9 1.8  PHOS 1.6* 2.8 3.1  GLUCOSE 204* 144* 85    Mirna Sutcliffe MS, RDN, LDN, CNSC RD Pager Number and RD On-Call Pager Number Located in [place]

## 2019-12-25 NOTE — Progress Notes (Signed)
Taylor Tyler at [place] NAME: Taylor Tyler    MR#:  [id number]  DATE OF BIRTH:  Jul 27, 1941  SUBJECTIVE:   Patient opens eyes to verbal command. Did tell me good morning today. When asked where she is she nodded yes to hospital.  Told me her sons's names today REVIEW OF SYSTEMS:   Review of Systems  Unable to perform ROS: Mental status change   Tolerating Diet:No --on NG feeds Tolerating PT: bedbound  DRUG ALLERGIES:   Allergies  Allergen Reactions  . Clams [Shellfish Allergy] Swelling and Other (See Comments)    THROAT SWELLS NECK TURNS RED  . Hydralazine Other (See Comments)    CHEST TIGHTNESS Patient has tolerated multiple doses of hydralazine since allergy was listed   . Norvasc [Amlodipine Besylate] Swelling    Leg edema   . Milk-Related Compounds Diarrhea    VITALS:  Blood pressure (!) 155/63, pulse 85, temperature 98.2 F (36.8 C), temperature source Axillary, resp. rate 18, height 5\' 6"  (1.676 m), weight 89.4 kg, SpO2 100 %.  PHYSICAL EXAMINATION:   Physical Exam  GENERAL: 79 y.o.-year-old patient lying in the bed with no acute distress. Obese, chornically ill EYES: Pupils equal, round, reactive to light and accommodation. No scleral icterus.   SKIN: Stage IV sacral decubitus ulcer, deep tissue injury of the left heel (present on admission) Pressure Injury 11/19/19 Sacrum Mid;Right Unstageable - Full thickness tissue loss in which the base of the injury is covered by slough (yellow, tan, gray, green or brown) and/or eschar (tan, brown or black) in the wound bed. deep wound with bed covered  (Active)  11/19/19 1800  Location: Sacrum  Location Orientation: Mid;Right  Staging: Unstageable - Full thickness tissue loss in which the base of the injury is covered by slough (yellow, tan, gray, green or brown) and/or eschar (tan, brown or black) in the wound bed.  Wound Description (Comments): deep wound with bed covered by slough   Present on Admission: Yes     Pressure Injury 11/19/19 Heel Left Deep Tissue Pressure Injury - Purple or maroon localized area of discolored intact skin or blood-filled blister due to damage of underlying soft tissue from pressure and/or shear. (Active)  11/19/19 1800  Location: Heel  Location Orientation: Left  Staging: Deep Tissue Pressure Injury - Purple or maroon localized area of discolored intact skin or blood-filled blister due to damage of underlying soft tissue from pressure and/or shear.  Wound Description (Comments):   Present on Admission: Yes   EYES: EOMI ENT: HD catheter+ neck CV: RRR PULM: CTA B ABD: soft, obese, NT, +BS PEG+ CNS: She is awake but she does follow some commands.  EXT: Bilateral ankle edema, no tenderness  LABORATORY PANEL:  CBC Recent Labs  Lab 12/22/19 0741 12/23/19 1445 12/24/19 1745  WBC 11.9*  --   --   HGB 6.8*   < > 10.1*  HCT 24.5*  --  34.4*  PLT 196  --   --    < > = values in this interval not displayed.    Chemistries  Recent Labs  Lab 12/25/19 0514  NA 140  K 4.1  CL 103  CO2 28  GLUCOSE 85  BUN 27*  CREATININE 1.52*  CALCIUM 8.0*  MG 1.8   Cardiac Enzymes No results for input(s): TROPONINI in the last 168 hours. RADIOLOGY:  No results found. ASSESSMENT AND PLAN:  Taylor Tyler is an 79 y.o. female with  a history ofend-stage renal disease, hypertension, diabetes, history of lumbar fusion, laminectomy 2014, with revision in 4944 complicated by wound infectiondue to Proteus  S/p septic shock/stage IV sacral decubitus wound infection with abscess and underlying osteomyelitis/Proteus and Citrobacter bacteremia -s/p debridement of necrotic sacral wound. - S/p CT-guided aspiration of 20 mls posterior paraspinal fluid collection.  -Continue IV meropenem while in the hospital and patient will discharged on PO Cipro till 01/22/20 -appreciate ID recommendations.  Acute toxic/metabolic encephalopathy: - Patient is still  nonverbal-- said couple words today. - Continue supportive care  Dysphagia -Patient is on enteral nutrition via NG tube.  -Plan of care was discussed with her son, Taylor Tyler.  He wants to go forward with PEG tube placement for enteral nutrition. - Consulted gastroenterologist, Dr. Allen Tyler  -status post peg tube placement by Dr. Alice Tyler 12/24/2019 -d/w dietitian-- resume TF  Hypokalemia and hypophosphatemia--repeleted.    ESRD: Follow-up with nephrologist for hemodialysis. -Patient will need to sit up for dialysis.  Anemia of chronic disease: Continue epoetin.  Monitor H&H. -Hemoglobin down to 6.8--- transfused one unit 5/22--son [facility] aware -hgb 10.1  Hypothyroidism:  -synthroid  Unstageable sacral decubitus ulcer and left heel deep tissuinjury (present on admission): Continue local wound care per wound nurse's recommendation.  Turn patient every 2 hours.  overall poor prognosis long term  DVT prophylaxis: Heparin Code Status: Full code Family Communication: son Taylor Tyler on the phone today Disposition Plan: TBD   Status is: Inpatient  Remains inpatient appropriate because:Altered mental status, IV treatments appropriate due to intensity of illness or inability to take PO and Inpatient level of care appropriate due to severity of illness PEG placement done--needs to be started on feeding from 5/23. Pt will alos need to sit up for HD--quiet challenging with pt's mentation   Dispo: The patient is from: SNF  Anticipated d/c is to: SNF  Anticipated d/c date is: unsure  Patient currently is not medically stable to d/c.   TOTAL TIME TAKING CARE OF THIS PATIENT: 25 minutes.  >50% time spent on counselling and coordination of care  Note: This dictation was prepared with Dragon dictation along with smaller phrase technology. Any transcriptional errors that result from this process are unintentional.  Taylor Tyler M.D    [facility]    CC: Primary care physician; Taylor Tyler, Taylor Tyler, MDPatient ID: Taylor Tyler, female   DOB: 27-Jul-1941, 79 y.o.   MRN: [id number]

## 2019-12-25 NOTE — Progress Notes (Signed)
PHARMACY CONSULT NOTE - FOLLOW UP  Pharmacy Consult for Electrolyte Monitoring and Replacement   Recent Labs: Potassium (mmol/L)  Date Value  12/25/2019 4.1  04/22/2014 4.0   Magnesium (mg/dL)  Date Value  12/25/2019 1.8  01/31/2014 1.5 (L)   Calcium (mg/dL)  Date Value  12/25/2019 8.0 (L)   Calcium, Total (mg/dL)  Date Value  04/22/2014 8.5   Albumin (g/dL)  Date Value  12/22/2019 1.6 (L)  01/27/2014 2.1 (L)   Phosphorus (mg/dL)  Date Value  12/25/2019 3.1   Sodium  Date Value  12/25/2019 140 mmol/L  04/10/2017 140  04/22/2014 140 mmol/L     Assessment: Patient is a 79yo female with complicated wound infection. Patient is ESRD and on hemodialysis. Patient now with multiple electrolyte abnormalities.  K 2.9, Mag 1.5, Phos 16  Goal of Therapy:  Maintain electrolytes WNL  Plan:  Electrolytes improved post HD session yesterday. No replacement indicated at this time. Continue to follow electrolytes.  Dorena Bodo, PharmD 12/25/2019 7:09 AM

## 2019-12-26 ENCOUNTER — Encounter: Payer: Self-pay | Admitting: *Deleted

## 2019-12-26 LAB — GLUCOSE, CAPILLARY
Glucose-Capillary: 128 mg/dL — ABNORMAL HIGH (ref 70–99)
Glucose-Capillary: 165 mg/dL — ABNORMAL HIGH (ref 70–99)
Glucose-Capillary: 154 mg/dL — ABNORMAL HIGH (ref 70–99)
Glucose-Capillary: 139 mg/dL — ABNORMAL HIGH (ref 70–99)
Glucose-Capillary: 123 mg/dL — ABNORMAL HIGH (ref 70–99)

## 2019-12-26 MED ORDER — SULFAMETHOXAZOLE-TRIMETHOPRIM 400-80 MG PO TABS
1.0000 | ORAL_TABLET | Freq: Two times a day (BID) | ORAL | Status: DC
  Administered 2019-12-27 (×2): 1 via ORAL
  Filled 2019-12-26 (×3): qty 1

## 2019-12-26 MED ORDER — DONEPEZIL HCL 5 MG PO TABS
5.0000 mg | ORAL_TABLET | Freq: Every day | ORAL | Status: DC
  Administered 2019-12-26 – 2019-12-27 (×2): 5 mg via ORAL
  Filled 2019-12-26 (×2): qty 1

## 2019-12-26 MED ORDER — RISPERIDONE 0.25 MG PO TABS
0.2500 mg | ORAL_TABLET | Freq: Two times a day (BID) | ORAL | Status: DC
  Administered 2019-12-26 – 2019-12-27 (×4): 0.25 mg via ORAL
  Filled 2019-12-26 (×5): qty 1

## 2019-12-26 MED ORDER — CITALOPRAM HYDROBROMIDE 10 MG PO TABS
10.0000 mg | ORAL_TABLET | Freq: Every day | ORAL | Status: DC
  Administered 2019-12-26 – 2019-12-27 (×2): 10 mg via ORAL
  Filled 2019-12-26 (×3): qty 1

## 2019-12-26 MED ORDER — HEPARIN SODIUM (PORCINE) 5000 UNIT/ML IJ SOLN
5000.0000 [IU] | Freq: Three times a day (TID) | INTRAMUSCULAR | Status: DC
  Administered 2019-12-26 – 2019-12-30 (×12): 5000 [IU] via SUBCUTANEOUS
  Filled 2019-12-26 (×12): qty 1

## 2019-12-26 MED ORDER — SEVELAMER CARBONATE 800 MG PO TABS
800.0000 mg | ORAL_TABLET | Freq: Three times a day (TID) | ORAL | Status: DC
  Administered 2019-12-26 – 2019-12-30 (×8): 800 mg via ORAL
  Filled 2019-12-26 (×13): qty 1

## 2019-12-26 MED ORDER — SULFAMETHOXAZOLE-TRIMETHOPRIM 800-160 MG PO TABS
1.0000 | ORAL_TABLET | Freq: Once | ORAL | Status: AC
  Administered 2019-12-26: 1 via ORAL
  Filled 2019-12-26: qty 1

## 2019-12-26 MED ORDER — FOLIC ACID 1 MG PO TABS
500.0000 ug | ORAL_TABLET | Freq: Every day | ORAL | Status: DC
  Administered 2019-12-26 – 2019-12-27 (×2): 0.5 mg via ORAL
  Filled 2019-12-26 (×2): qty 1

## 2019-12-26 MED ORDER — MEMANTINE HCL 5 MG PO TABS
5.0000 mg | ORAL_TABLET | Freq: Two times a day (BID) | ORAL | Status: DC
  Administered 2019-12-26 – 2019-12-27 (×4): 5 mg via ORAL
  Filled 2019-12-26 (×4): qty 1

## 2019-12-26 MED ORDER — ATORVASTATIN CALCIUM 20 MG PO TABS
40.0000 mg | ORAL_TABLET | Freq: Every day | ORAL | Status: DC
  Administered 2019-12-26 – 2019-12-29 (×4): 40 mg via ORAL
  Filled 2019-12-26 (×5): qty 2

## 2019-12-26 NOTE — Care Management Important Message (Signed)
Important Message  Patient Details  Name: Taylor Tyler MRN: [id number] Date of Birth: 06-29-1941   Medicare Important Message Given:  Yes     Shelbie Ammons, RN 12/26/2019, 1:24 PM

## 2019-12-26 NOTE — Progress Notes (Signed)
[place] at [place] NAME: Taylor Tyler    MR#:  [id number]  DATE OF BIRTH:  Apr 24, 1941  SUBJECTIVE:   Patient opens eyes to verbal command. Did tell me good morning today. When asked where she is she nodded yes to hospital. "I am Ok'  started PEG feeding --tolerating well REVIEW OF SYSTEMS:   Review of Systems  Unable to perform ROS: Mental status change   Tolerating Diet:No --on NG feeds Tolerating PT: bedbound  DRUG ALLERGIES:   Allergies  Allergen Reactions  . Clams [Shellfish Allergy] Swelling and Other (See Comments)    THROAT SWELLS NECK TURNS RED  . Hydralazine Other (See Comments)    CHEST TIGHTNESS Patient has tolerated multiple doses of hydralazine since allergy was listed   . Norvasc [Amlodipine Besylate] Swelling    Leg edema   . Milk-Related Compounds Diarrhea    VITALS:  Blood pressure 118/69, pulse 82, temperature 98.2 F (36.8 C), temperature source Oral, resp. rate 16, height 5\' 6"  (1.676 m), weight 89.4 kg, SpO2 100 %.  PHYSICAL EXAMINATION:   Physical Exam  GENERAL:  79 y.o.-year-old patient lying in the bed with no acute distress. Obese, chornically ill EYES: Pupils equal, round, reactive to light and accommodation. No scleral icterus.   SKIN: Stage IV sacral decubitus ulcer, deep tissue injury of the left heel (present on admission) Pressure Injury 11/19/19 Sacrum Mid;Right Unstageable - Full thickness tissue loss in which the base of the injury is covered by slough (yellow, tan, gray, green or brown) and/or eschar (tan, brown or black) in the wound bed. deep wound with bed covered  (Active)  11/19/19 1800  Location: Sacrum  Location Orientation: Mid;Right  Staging: Unstageable - Full thickness tissue loss in which the base of the injury is covered by slough (yellow, tan, gray, green or brown) and/or eschar (tan, brown or black) in the wound bed.  Wound Description (Comments): deep wound with bed covered  by slough  Present on Admission: Yes     Pressure Injury 11/19/19 Heel Left Deep Tissue Pressure Injury - Purple or maroon localized area of discolored intact skin or blood-filled blister due to damage of underlying soft tissue from pressure and/or shear. (Active)  11/19/19 1800  Location: Heel  Location Orientation: Left  Staging: Deep Tissue Pressure Injury - Purple or maroon localized area of discolored intact skin or blood-filled blister due to damage of underlying soft tissue from pressure and/or shear.  Wound Description (Comments):   Present on Admission: Yes   EYES: EOMI ENT: HD catheter+ neck CV: RRR PULM: CTA B ABD: soft, obese, NT, +BS PEG+ CNS: She is awake but she does follow some commands.  EXT: Bilateral ankle edema, no tenderness  LABORATORY PANEL:  CBC Recent Labs  Lab 12/22/19 0741 12/23/19 1445 12/24/19 1745  WBC 11.9*  --   --   HGB 6.8*   < > 10.1*  HCT 24.5*  --  34.4*  PLT 196  --   --    < > = values in this interval not displayed.    Chemistries  Recent Labs  Lab 12/25/19 0514  NA 140  K 4.1  CL 103  CO2 28  GLUCOSE 85  BUN 27*  CREATININE 1.52*  CALCIUM 8.0*  MG 1.8   Cardiac Enzymes No results for input(s): TROPONINI in the last 168 hours. RADIOLOGY:  No results found. ASSESSMENT AND PLAN:  Taylor Tyler is an 79 y.o. female  with a history ofend-stage renal disease, hypertension, diabetes, history of lumbar fusion, laminectomy 2014, with revision in 0263 complicated by wound infectiondue to Proteus  S/p septic shock/stage IV sacral decubitus wound infection with abscess and underlying osteomyelitis/Proteus and Citrobacter bacteremia -s/p debridement of necrotic sacral wound. - S/p CT-guided aspiration of 20 mls posterior paraspinal fluid collection.  -recieved IV meropenem while in the hospital and patient will be on Pob Bactrim indefinitely-appreciate ID recommendations.  Acute toxic/metabolic encephalopathy: - Patient's  mentation improving-- saying few things--taking longer to respond - Continue supportive care  Dysphagia -Patient was on enteral nutrition via NG tube.  -Plan of care was discussed with her son, [name].  He wants to go forward with PEG tube placement for enteral nutrition. - Consulted gastroenterologist, Dr. Prescott Gum -status post peg tube placement by Dr. Alice Reichert 12/24/2019 -d/w dietitian-- resumed TF  Hypokalemia and hypophosphatemia--repeleted.    ESRD: Follow-up with nephrologist for hemodialysis.resume sevelamer -Patient will need to sit up for dialysis.PT consulted  Anemia of chronic disease: Continue epoetin.  Monitor H&H. -Hemoglobin down to 6.8--- transfused one unit 5/22--son [facility] aware -hgb 10.1  Hypothyroidism:  -synthroid  Dementia resumed aricept, risperdal and nameda  Unstageable sacral decubitus ulcer and left heel deep tissuinjury (present on admission): Continue local wound care per wound nurse's recommendation.  Turn patient every 2 hours.  overall poor prognosis long term, slow improvement  DVT prophylaxis: Heparin Code Status: Full code Family Communication: son Ollen Gross on the phone today Disposition Plan: TBD   Status is: Inpatient  Remains inpatient appropriate because:Altered mental status PEG placement done--now  on PEG feeding from 5/23. Pt will also need to sit up for HD--quiet challenging with pt's mentation. PT consulted   Dispo: The patient is from: SNF  Anticipated d/c is to: SNF  Anticipated d/c date is: unsure  Patient currently is not medically stable to d/c.   TOTAL TIME TAKING CARE OF THIS PATIENT: 25 minutes.  >50% time spent on counselling and coordination of care  Note: This dictation was prepared with Dragon dictation along with smaller phrase technology. Any transcriptional errors that result from this process are unintentional.  Fritzi Mandes M.D    [facility]    CC: Primary care physician; McLean-Scocuzza, Nino Glow, MDPatient ID: Taylor Tyler, female   DOB: 08-03-1941, 79 y.o.   MRN: [id number]

## 2019-12-26 NOTE — Progress Notes (Signed)
[facility]  ROUNDING NOTE   Subjective:   No acute events Tracking in the room.  Able to follow simple commands Tube feeds going in at 40 cc/h  Objective:  Vital signs in last 24 hours:  Temp:  [97.8 F (36.6 C)-98.5 F (36.9 C)] 98.2 F (36.8 C) (05/24 0728) Pulse Rate:  [80-86] 82 (05/24 0728) Resp:  [15-18] 16 (05/24 0728) BP: (118-142)/(56-90) 118/69 (05/24 0728) SpO2:  [98 %-100 %] 100 % (05/24 0728)  Weight change:  Filed Weights   12/22/19 0444 12/22/19 0803 12/25/19 0500  Weight: 98.3 kg 98.3 kg 89.4 kg    Intake/Output: I/O last 3 completed shifts: In: 453.4 [I.V.:1.4; Other:210; NG/GT:142; IV Piggyback:100] Out: 1 [Stool:1]   Intake/Output this shift:  No intake/output data recorded.   Physical Exam: General:  No acute distress, laying in the bed, chronically ill-appearing  HEENT  anicteric, moist oral mucous membrane  Pulm/lungs  normal breathing effort, lungs are clear to auscultation  CVS/Heart  no rub or gallop  Abdomen:   Soft, nontender, PEG tube in place  Extremities:  Dependent edema is present  Neurologic:  Eyes are open, tracks person in room, able to follow simple commands such as squeeze hands and wiggle toes  Skin:  No acute rashes  Right IJ PermCath   Basic Metabolic Panel: Recent Labs  Lab 12/20/19 0630 12/20/19 0630 12/22/19 0741 12/22/19 0741 12/23/19 1319 12/24/19 0424 12/25/19 0514  NA 138  --  137  --  139 140 140  K 3.3*  --  2.7*  --  2.9* 2.9* 4.1  CL 105  --  102  --  101 103 103  CO2 25  --  27  --  29 29 28   GLUCOSE 137*  --  190*  --  204* 144* 85  BUN 20  --  36*  --  35* 44* 27*  CREATININE 2.30*  --  2.27*  --  1.83* 1.97* 1.52*  CALCIUM 8.1*   < > 7.8*   < > 7.9* 7.9* 8.0*  MG 1.9  --   --   --  1.5* 1.9 1.8  PHOS 2.1*  --  <1.0*  --  1.6* 2.8 3.1   < > = values in this interval not displayed.    Liver Function Tests: Recent Labs  Lab 12/22/19 0741  ALBUMIN 1.6*   No results for  input(s): LIPASE, AMYLASE in the last 168 hours. No results for input(s): AMMONIA in the last 168 hours.  CBC: Recent Labs  Lab 12/20/19 0630 12/22/19 0741 12/23/19 1445 12/24/19 1745  WBC 12.7* 11.9*  --   --   NEUTROABS 8.8*  --   --   --   HGB 7.6* 6.8* 6.8* 10.1*  HCT 25.8* 24.5*  --  34.4*  MCV 91.8 96.1  --   --   PLT 215 196  --   --     Cardiac Enzymes: No results for input(s): CKTOTAL, CKMB, CKMBINDEX, TROPONINI in the last 168 hours.  BNP: Invalid input(s): POCBNP  CBG: Recent Labs  Lab 12/25/19 1147 12/25/19 1626 12/25/19 2137 12/26/19 0728 12/26/19 1155  GLUCAP 94 90 110* 165* 154*    Microbiology: Results for orders placed or performed during the hospital encounter of 12/11/19  Blood Culture (routine x 2)     Status: Abnormal   Collection Time: 12/11/19  7:45 PM   Specimen: BLOOD  Result Value Ref Range Status   Specimen Description   Final  BLOOD RIGHT FA Performed at [facility], [address] [place], [place] [address]    Special Requests   Final    BOTTLES DRAWN AEROBIC AND ANAEROBIC Blood Culture adequate volume Performed at [facility], [address] [place], [place] [address]    Culture  Setup Time   Final    GRAM NEGATIVE RODS IN BOTH AEROBIC AND ANAEROBIC BOTTLES CRITICAL RESULT CALLED TO, READ BACK BY AND VERIFIED WITH: Rayna Sexton AT 4259 ON 12/12/19 SNG    Culture (A)  Final    PROTEUS MIRABILIS CITROBACTER YOUNGAE SUSCEPTIBILITIES PERFORMED ON PREVIOUS CULTURE WITHIN THE LAST 5 DAYS. Performed at [facility], [place] [address] [place], [place] [address]    Report Status 12/16/2019 FINAL  Final  Blood Culture (routine x 2)     Status: Abnormal   Collection Time: 12/11/19  7:45 PM   Specimen: BLOOD  Result Value Ref Range Status   Specimen Description   Final    BLOOD RIGHT ARM Performed at [facility], [address], [place], [place] [address]    Special Requests   Final     BOTTLES DRAWN AEROBIC AND ANAEROBIC Blood Culture adequate volume Performed at [facility], [address] [place], [place] [address]    Culture  Setup Time   Final    GRAM NEGATIVE RODS IN BOTH AEROBIC AND ANAEROBIC BOTTLES CRITICAL VALUE NOTED.  VALUE IS CONSISTENT WITH PREVIOUSLY REPORTED AND CALLED VALUE. Performed at [facility], [address] [place],  [address]    Culture PROTEUS MIRABILIS Su Hoff  (A)  Final   Report Status 12/16/2019 FINAL  Final   Organism ID, Bacteria PROTEUS MIRABILIS  Final   Organism ID, Bacteria CITROBACTER YOUNGAE  Final      Susceptibility   Citrobacter youngae - MIC*    CEFAZOLIN >=64 RESISTANT Resistant     CEFEPIME <=1 SENSITIVE Sensitive     CEFTAZIDIME >=64 RESISTANT Resistant     CEFTRIAXONE >=64 RESISTANT Resistant     CIPROFLOXACIN <=0.25 SENSITIVE Sensitive     GENTAMICIN <=1 SENSITIVE Sensitive     IMIPENEM 1 SENSITIVE Sensitive     TRIMETH/SULFA <=20 SENSITIVE Sensitive     PIP/TAZO 64 INTERMEDIATE Intermediate     * CITROBACTER YOUNGAE   Proteus mirabilis - MIC*    AMPICILLIN <=2 SENSITIVE Sensitive     CEFAZOLIN <=4 SENSITIVE Sensitive     CEFEPIME <=1 SENSITIVE Sensitive     CEFTAZIDIME <=1 SENSITIVE Sensitive     CEFTRIAXONE <=1 SENSITIVE Sensitive     CIPROFLOXACIN <=0.25 SENSITIVE Sensitive     GENTAMICIN <=1 SENSITIVE Sensitive     IMIPENEM 4 SENSITIVE Sensitive     TRIMETH/SULFA <=20 SENSITIVE Sensitive     AMPICILLIN/SULBACTAM <=2 SENSITIVE Sensitive     PIP/TAZO <=4 SENSITIVE Sensitive     * PROTEUS MIRABILIS  Blood Culture ID Panel (Reflexed)     Status: Abnormal   Collection Time: 12/11/19  7:45 PM  Result Value Ref Range Status   Enterococcus species NOT DETECTED NOT DETECTED Final   Listeria monocytogenes NOT DETECTED NOT DETECTED Final   Staphylococcus species NOT DETECTED NOT DETECTED Final   Staphylococcus aureus (BCID) NOT DETECTED NOT DETECTED Final    Streptococcus species NOT DETECTED NOT DETECTED Final   Streptococcus agalactiae NOT DETECTED NOT DETECTED Final   Streptococcus pneumoniae NOT DETECTED NOT DETECTED Final   Streptococcus pyogenes NOT DETECTED NOT DETECTED Final   Acinetobacter baumannii NOT  DETECTED NOT DETECTED Final   Enterobacteriaceae species DETECTED (A) NOT DETECTED Final    Comment: Enterobacteriaceae represent a large family of gram-negative bacteria, not a single organism. CRITICAL RESULT CALLED TO, READ BACK BY AND VERIFIED WITH: Rayna Sexton AT 6144 ON 12/12/19 SNG    Enterobacter cloacae complex NOT DETECTED NOT DETECTED Final   Escherichia coli NOT DETECTED NOT DETECTED Final   Klebsiella oxytoca NOT DETECTED NOT DETECTED Final   Klebsiella pneumoniae NOT DETECTED NOT DETECTED Final   Proteus species DETECTED (A) NOT DETECTED Final    Comment: CRITICAL RESULT CALLED TO, READ BACK BY AND VERIFIED WITH: Rayna Sexton AT 3154 ON 12/12/19 SNG    Serratia marcescens NOT DETECTED NOT DETECTED Final   Carbapenem resistance NOT DETECTED NOT DETECTED Final   Haemophilus influenzae NOT DETECTED NOT DETECTED Final   Neisseria meningitidis NOT DETECTED NOT DETECTED Final   Pseudomonas aeruginosa NOT DETECTED NOT DETECTED Final   Candida albicans NOT DETECTED NOT DETECTED Final   Candida glabrata NOT DETECTED NOT DETECTED Final   Candida krusei NOT DETECTED NOT DETECTED Final   Candida parapsilosis NOT DETECTED NOT DETECTED Final   Candida tropicalis NOT DETECTED NOT DETECTED Final    Comment: Performed at [facility], [address], [place], [place] [address]  Urine culture     Status: Abnormal   Collection Time: 12/11/19  8:09 PM   Specimen: In/Out Cath Urine  Result Value Ref Range Status   Specimen Description   Final    IN/OUT CATH URINE Performed at [facility], [address] [place], [place] [address]    Special Requests   Final    NONE Performed at [facility], [address] [place], [place] [address]    Culture MULTIPLE SPECIES PRESENT, SUGGEST RECOLLECTION (A)  Final   Report Status 12/12/2019 FINAL  Final  Respiratory Panel by RT PCR (Flu A&B, Covid) - Nasopharyngeal Swab     Status: None   Collection Time: 12/11/19  9:17 PM   Specimen: Nasopharyngeal Swab  Result Value Ref Range Status   SARS Coronavirus 2 by RT PCR NEGATIVE NEGATIVE Final    Comment: (NOTE) SARS-CoV-2 target nucleic acids are NOT DETECTED. The SARS-CoV-2 RNA is generally detectable in upper respiratoy specimens during the acute phase of infection. The lowest concentration of SARS-CoV-2 viral copies this assay can detect is 131 copies/mL. A negative result does not preclude SARS-Cov-2 infection and should not be used as the sole basis for treatment or other patient management decisions. A negative result may occur with  improper specimen collection/handling, submission of specimen other than nasopharyngeal swab, presence of viral mutation(s) within the areas targeted by this assay, and inadequate number of viral copies (<131 copies/mL). A negative result must be combined with clinical observations, patient history, and epidemiological information. The expected result is Negative. Fact Sheet for Patients:  PinkCheek.be Fact Sheet for Healthcare Providers:  GravelBags.it This test is not yet ap proved or cleared by the [place] FDA and  has been authorized for detection and/or diagnosis of SARS-CoV-2 by FDA under an Emergency Use Authorization (EUA). This EUA will remain  in effect (meaning this test can be used) for the duration of the COVID-19 declaration under Section 564(b)(1) of the Act, 21 U.S.C. section 360bbb-3(b)(1), unless the authorization is terminated or revoked sooner.    Influenza A by PCR NEGATIVE NEGATIVE Final   Influenza B by PCR NEGATIVE NEGATIVE Final    Comment: (NOTE) The Xpert Xpress  SARS-CoV-2/FLU/RSV assay is intended as an aid in  the diagnosis of influenza from Nasopharyngeal swab specimens and  should not be used as a sole basis for treatment. Nasal washings and  aspirates are unacceptable for Xpert Xpress SARS-CoV-2/FLU/RSV  testing. Fact Sheet for Patients: PinkCheek.be Fact Sheet for Healthcare Providers: GravelBags.it This test is not yet approved or cleared by the [place] FDA and  has been authorized for detection and/or diagnosis of SARS-CoV-2 by  FDA under an Emergency Use Authorization (EUA). This EUA will remain  in effect (meaning this test can be used) for the duration of the  Covid-19 declaration under Section 564(b)(1) of the Act, 21  U.S.C. section 360bbb-3(b)(1), unless the authorization is  terminated or revoked. Performed at [facility], [address] [place], [place] [address]   MRSA PCR Screening     Status: None   Collection Time: 12/12/19 12:29 AM   Specimen: Nasopharyngeal  Result Value Ref Range Status   MRSA by PCR NEGATIVE NEGATIVE Final    Comment:        The GeneXpert MRSA Assay (FDA approved for NASAL specimens only), is one component of a comprehensive MRSA colonization surveillance program. It is not intended to diagnose MRSA infection nor to guide or monitor treatment for MRSA infections. Performed at [facility], [address], [place], [place] [address]   Aerobic/Anaerobic Culture (surgical/deep wound)     Status: None   Collection Time: 12/12/19  2:06 AM   Specimen: Sacral; Wound  Result Value Ref Range Status   Specimen Description   Final    SACRAL Performed at [facility], [address] [place], [place] [address]    Special Requests   Final    NONE Performed at [facility], [place], [place] [address]    Gram Stain NO WBC SEEN NO ORGANISMS SEEN   Final   Culture   Final    FEW  ESCHERICHIA COLI FEW CITROBACTER SPECIES FEW PROTEUS MIRABILIS FEW BACTEROIDES OVATUS BETA LACTAMASE POSITIVE Performed at [facility], [place] [address] [place],  [address]    Report Status 12/15/2019 FINAL  Final   Organism ID, Bacteria ESCHERICHIA COLI  Final   Organism ID, Bacteria CITROBACTER SPECIES  Final   Organism ID, Bacteria PROTEUS MIRABILIS  Final      Susceptibility   Citrobacter species - MIC*    CEFAZOLIN >=64 RESISTANT Resistant     CEFEPIME <=1 SENSITIVE Sensitive     CEFTAZIDIME >=64 RESISTANT Resistant     CEFTRIAXONE >=64 RESISTANT Resistant     CIPROFLOXACIN <=0.25 SENSITIVE Sensitive     GENTAMICIN <=1 SENSITIVE Sensitive     IMIPENEM <=0.25 SENSITIVE Sensitive     TRIMETH/SULFA <=20 SENSITIVE Sensitive     PIP/TAZO 64 INTERMEDIATE Intermediate     * FEW CITROBACTER SPECIES   Escherichia coli - MIC*    AMPICILLIN 16 INTERMEDIATE Intermediate     CEFAZOLIN >=64 RESISTANT Resistant     CEFEPIME <=1 SENSITIVE Sensitive     CEFTAZIDIME <=1 SENSITIVE Sensitive     CEFTRIAXONE <=1 SENSITIVE Sensitive     CIPROFLOXACIN <=0.25 SENSITIVE Sensitive     GENTAMICIN <=1 SENSITIVE Sensitive     IMIPENEM <=0.25 SENSITIVE Sensitive     TRIMETH/SULFA <=20 SENSITIVE Sensitive     AMPICILLIN/SULBACTAM 4 SENSITIVE Sensitive     PIP/TAZO <=4 SENSITIVE Sensitive     * FEW ESCHERICHIA COLI   Proteus mirabilis - MIC*  AMPICILLIN <=2 SENSITIVE Sensitive     CEFAZOLIN <=4 SENSITIVE Sensitive     CEFEPIME <=1 SENSITIVE Sensitive     CEFTAZIDIME <=1 SENSITIVE Sensitive     CEFTRIAXONE <=1 SENSITIVE Sensitive     CIPROFLOXACIN <=0.25 SENSITIVE Sensitive     GENTAMICIN 2 SENSITIVE Sensitive     IMIPENEM 4 SENSITIVE Sensitive     TRIMETH/SULFA <=20 SENSITIVE Sensitive     AMPICILLIN/SULBACTAM <=2 SENSITIVE Sensitive     PIP/TAZO <=4 SENSITIVE Sensitive     * FEW PROTEUS MIRABILIS  Aerobic/Anaerobic Culture (surgical/deep wound)     Status: None   Collection  Time: 12/12/19  2:06 AM   Specimen: Back; Wound  Result Value Ref Range Status   Specimen Description   Final    BACK LUMBAR SPINE Performed at [facility], [address] [place], [place] [address]    Special Requests   Final    NONE Performed at [facility], [place], [place] [address]    Gram Stain NO WBC SEEN NO ORGANISMS SEEN   Final   Culture   Final    FEW PROTEUS MIRABILIS NO ANAEROBES ISOLATED Performed at [facility], [place] [address] [place], [place] [address]    Report Status 12/17/2019 FINAL  Final   Organism ID, Bacteria PROTEUS MIRABILIS  Final      Susceptibility   Proteus mirabilis - MIC*    AMPICILLIN <=2 SENSITIVE Sensitive     CEFAZOLIN <=4 SENSITIVE Sensitive     CEFEPIME <=1 SENSITIVE Sensitive     CEFTAZIDIME <=1 SENSITIVE Sensitive     CEFTRIAXONE <=1 SENSITIVE Sensitive     CIPROFLOXACIN <=0.25 SENSITIVE Sensitive     GENTAMICIN 2 SENSITIVE Sensitive     IMIPENEM 4 SENSITIVE Sensitive     TRIMETH/SULFA <=20 SENSITIVE Sensitive     AMPICILLIN/SULBACTAM <=2 SENSITIVE Sensitive     PIP/TAZO <=4 SENSITIVE Sensitive     * FEW PROTEUS MIRABILIS  Aerobic/Anaerobic Culture (surgical/deep wound)     Status: None   Collection Time: 12/19/19  3:08 PM   Specimen: Abscess  Result Value Ref Range Status   Specimen Description   Final    ABSCESS Performed at [facility], [address], [place], [place] [address]    Special Requests   Final    NONE Performed at [facility], [address] [place], [place] [address]    Gram Stain   Final    ABUNDANT WBC PRESENT, PREDOMINANTLY PMN RARE GRAM POSITIVE RODS RARE GRAM NEGATIVE RODS    Culture   Final    FEW ESCHERICHIA COLI MODERATE PROTEUS MIRABILIS NO ANAEROBES ISOLATED Performed at [facility], [place] [address], [place], [place] [address]    Report Status 12/24/2019 FINAL  Final   Organism ID, Bacteria ESCHERICHIA COLI  Final    Organism ID, Bacteria PROTEUS MIRABILIS  Final      Susceptibility   Escherichia coli - MIC*    AMPICILLIN >=32 RESISTANT Resistant     CEFAZOLIN >=64 RESISTANT Resistant     CEFEPIME <=1 SENSITIVE Sensitive     CEFTAZIDIME >=64 RESISTANT Resistant     CEFTRIAXONE >=64 RESISTANT Resistant     CIPROFLOXACIN <=0.25 SENSITIVE Sensitive     GENTAMICIN <=1 SENSITIVE Sensitive     IMIPENEM 1 SENSITIVE Sensitive     TRIMETH/SULFA <=20 SENSITIVE Sensitive     AMPICILLIN/SULBACTAM >=32 RESISTANT Resistant     PIP/TAZO 64 INTERMEDIATE Intermediate     *  FEW ESCHERICHIA COLI   Proteus mirabilis - MIC*    AMPICILLIN >=32 RESISTANT Resistant     CEFAZOLIN >=64 RESISTANT Resistant     CEFEPIME RESISTANT Resistant     CEFTAZIDIME >=64 RESISTANT Resistant     CEFTRIAXONE >=64 RESISTANT Resistant     CIPROFLOXACIN <=0.25 SENSITIVE Sensitive     GENTAMICIN <=1 SENSITIVE Sensitive     IMIPENEM 4 SENSITIVE Sensitive     TRIMETH/SULFA <=20 SENSITIVE Sensitive     AMPICILLIN/SULBACTAM <=2 SENSITIVE Sensitive     PIP/TAZO <=4 SENSITIVE Sensitive     * MODERATE PROTEUS MIRABILIS    Coagulation Studies: No results for input(s): LABPROT, INR in the last 72 hours.  Urinalysis: No results for input(s): COLORURINE, LABSPEC, PHURINE, GLUCOSEU, HGBUR, BILIRUBINUR, KETONESUR, PROTEINUR, UROBILINOGEN, NITRITE, LEUKOCYTESUR in the last 72 hours.  Invalid input(s): APPERANCEUR    Imaging: No results found.   Medications:   . sodium chloride Stopped (12/25/19 1756)  . albumin human Stopped (12/22/19 1346)  . feeding supplement (JEVITY 1.5 CAL/FIBER) 1,000 mL (12/25/19 1720)   . atorvastatin  40 mg Oral q1800  . chlorhexidine  15 mL Mouth Rinse BID  . Chlorhexidine Gluconate Cloth  6 each Topical Q0600  . citalopram  10 mg Oral Daily  . donepezil  5 mg Oral QHS  . epoetin (EPOGEN/PROCRIT) injection  10,000 Units Intravenous Q T,Th,Sa-HD  . feeding supplement (PRO-STAT SUGAR FREE 64)  30 mL Per Tube  BID  . folic acid  885 mcg Oral Daily  . heparin injection (subcutaneous)  5,000 Units Subcutaneous Q8H  . insulin aspart  0-5 Units Subcutaneous QHS  . insulin aspart  0-9 Units Subcutaneous TID WC  . levothyroxine  100 mcg Per Tube Q0600  . mouth rinse  15 mL Mouth Rinse q12n4p  . memantine  5 mg Oral BID  . midodrine  5 mg Per Tube TID WC  . pantoprazole sodium  40 mg Per Tube Daily  . phosphorus  500 mg Per Tube BID  . risperiDONE  0.25 mg Oral BID  . sevelamer carbonate  800 mg Oral TID WC  . sodium chloride flush  10-40 mL Intracatheter Q12H  . [START ON 12/27/2019] sulfamethoxazole-trimethoprim  1 tablet Oral Q12H   acetaminophen, docusate sodium, HYDROcodone-acetaminophen, ipratropium-albuterol, ondansetron (ZOFRAN) IV, polyethylene glycol, sodium chloride flush  Assessment/ Plan:   Taylor Tyler is a 79 y.o. black female with end stage renal disease on hemodialysis, dementia, diabetes mellitus type II, hypertension, hypothyroidism, osteomyelitis, admitted to [facility] on 12/11/2019 for Encephalopathy acute [G93.40] Urinary tract infection, acute [N39.0] Osteomyelitis, unspecified site, unspecified type ([place]) [M86.9]  [facility] [address] TTS RIJ 79kg.  #.  ESRD on HD TTS:   Next hemodialysis scheduled for Tuesday  #.  Anemia of chronic kidney disease.  Lab Results  Component Value Date   HGB 10.1 (L) 12/24/2019    - transfusion as per hospitalist.  - EPO with HD treatment.  - Appreciate GI input.   #  Secondary hyperparathyroidism: Lab Results  Component Value Date   PTH 68 (H) 12/13/2019   CALCIUM 8.0 (L) 12/25/2019   CAION 1.11 (L) 06/23/2019   PHOS 3.1 12/25/2019  Monitor calcium and phosphorus during this admission   #Sepsis with stage IV sacral decubitus wound infection with abscess and underlying osteomyelitis, Proteus and Citrobacter bacteremia Status post debridement of necrotic sacral wound Status post CT-guided  aspiration  Overall prognosis is very poor despite aggressive measures. Appreciate palliative care  input.    LOS: [place] 5/24/20212:40 PM

## 2019-12-26 NOTE — Progress Notes (Signed)
ID  Subjective- non available from patient  O/e  Pt is awake Noted fine tremors rt arm and face Responds to questions intermittently More alert than last week  Patient Vitals for the past 24 hrs:  BP Temp Temp src Pulse Resp SpO2  12/26/19 1548 (!) 157/69 98.2 F (36.8 C) -- 82 17 91 %  12/26/19 0728 118/69 98.2 F (36.8 C) Oral 82 16 100 %  12/26/19 0033 (!) 142/90 97.8 F (36.6 C) Oral 86 18 98 %   Chest b/l air entry Hs irregular  Sacral wound and lumbar wound not seen today Will evaluate with next dressing change     Rt IJ permacath   Labs CBC Latest Ref Rng & Units 12/24/2019 12/23/2019 12/22/2019  WBC 4.0 - 10.5 K/uL - - 11.9(H)  Hemoglobin 12.0 - 15.0 g/dL 10.1(L) 6.8(L) 6.8(L)  Hematocrit 36.0 - 46.0 % 34.4(L) - 24.5(L)  Platelets 150 - 400 K/uL - - 196    CMP Latest Ref Rng & Units 12/25/2019 12/24/2019 12/23/2019  Glucose 70 - 99 mg/dL 85 144(H) 204(H)  BUN 8 - 23 mg/dL 27(H) 44(H) 35(H)  Creatinine 0.44 - 1.00 mg/dL 1.52(H) 1.97(H) 1.83(H)  Sodium 135 - 145 mmol/L 140 140 139  Potassium 3.5 - 5.1 mmol/L 4.1 2.9(L) 2.9(L)  Chloride 98 - 111 mmol/L 103 103 101  CO2 22 - 32 mmol/L 28 29 29   Calcium 8.9 - 10.3 mg/dL 8.0(L) 7.9(L) 7.9(L)  Total Protein 6.5 - 8.1 g/dL - - -  Total Bilirubin 0.3 - 1.2 mg/dL - - -  Alkaline Phos 38 - 126 U/L - - -  AST 15 - 41 U/L - - -  ALT 0 - 44 U/L - - -   Microbiology  Blood culture from 12/11/2019 4 out of 4 bottles Proteus mirabilis and Citrobacter youngae Proteus is pansensitive Citrobacter is sensitive to cefepime, ciprofloxacin, gentamicin and imipenem and trimethoprim sulfamethoxazole    12/12/2019 Site of prior surgery at the lumbar spine L4 area Proteus mirabilis pansensitive.  12/12/2019 from the new sacral ulcer Multiple organisms including E. coli, Citrobacter, Bacteroides, Proteus.  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 The entire system was removed in November 2020. She has been treated three times with prolonged course of IV antibiotics in the past with recurrence ( or persistence) The recent CT abdomen showed  6.2 cm x 4.5 cm area of air and fluidcolectionseen posterior to the spinal canal at the level of the L4 vertebral body.Discussed with herneuroosurgeon Dr.Nundkumar who did not recommend surgical intervention with her underlying morbidities and also recurrent surgical intervention in the past. IR aspirated 20 cc of fluid from the paraspinal areaon 12/19/19 and it is proteus   Doubt antibiotic is ever going to cure the infection in the spine especially with her being bed bound and having new pressure wounds as well Completed 2 weeks of IV antibiotic She has had a permacth since NOV 2020 . The question is whether that will have to be removed? But as she has an underlying chronic paraspinal infection it does not matter . Will repeat another blood culture to see whether both bacteria have been eradicated- If so she can be switched to PO bactrim ( SS 1 BID) adjusted to Crcl which she is going to need indefinitely  Encephalopathy Pt was on cefepime + flagyl and because of encephalopathy they were discontinued on 5/18 and she is now on meropenem- back to baseline. Will avoid antibiotics that can cause encephalopathy like the above 2 and also ciprofloxacin  Stage IV sacral decubitus- debrided- as per son this is likely due to the hoist used in the NH The sacral ulcer has citrobacter, e.coli, proteus, anerobes ( bacteroides)  ESRD on dialysis  Pt has poor prognosis  Discussed with her son [place]onia Kitner in great detail Discussed with hospitalist

## 2019-12-26 NOTE — TOC Progression Note (Signed)
Transition of Care [facility]) - Progression Note    Patient Details  Name: Taylor Tyler MRN: [id number] Date of Birth: [birth year]  [facility] CM/SW Contact  Rey Fors, Gardiner Rhyme, LCSW Phone Number: 12/26/2019, 2:02 PM  Clinical Narrative:  Pt will need to be able to sit up for fours hours due to going to OP-HD and needing to do this. MD will write order for this. Will need an new COVID test once medically stable to return to [facility] where she is a long term pt. Seems to be doing better-more alert now PEG feedings started.          Expected Discharge Plan and Services                                                 Social Determinants of Health (SDOH) Interventions    Readmission Risk Interventions No flowsheet data found.

## 2019-12-26 NOTE — Progress Notes (Signed)
   Daily Progress Note   Patient Name: Taylor Tyler       Date: 12/26/2019 DOB: [birth year]  Age: 79 y.o. MRN#: [id number] Attending Physician: Fritzi Mandes, MD Primary Care Physician: McLean-Scocuzza, Nino Glow, MD Admit Date: 12/11/2019  Reason for Consultation/Follow-up: Establishing goals of care  Subjective: Patient resting in bed. Easily awakened on verbal stimuli. Much more awake and alert compared to previous weeks. Able to decline pain by stating "no". States she "feels alright!" This is much improved as previously she was not verbal or able to follow commands.   PEG tube was successfully placed on 12/24/19 and she seems to be tolerating feedings without difficulty. No family at the bedside.   Reached out to son, but he was not available. He has my contact information. If patient continues to tolerate feedings and HD with plans to return to long-term care facility. Would recommend outpatient palliative support.   Length of Stay: 15 days  Vital Signs: BP (!) 157/69 (BP Location: Left Arm)   Pulse 82   Temp 98.2 F (36.8 C)   Resp 17   Ht 5\' 6"  (1.676 m)   Wt 89.4 kg   SpO2 91%   BMI 31.81 kg/m  SpO2: SpO2: 91 % O2 Device: O2 Device: Room Air O2 Flow Rate: O2 Flow Rate (L/min): 6 L/min  Intake/output summary:   Intake/Output Summary (Last 24 hours) at 12/26/2019 2227 Last data filed at 12/26/2019 2204 Gross per 24 hour  Intake 10 ml  Output 0 ml  Net 10 ml   LBM:   Baseline Weight: Weight: 75 kg Most recent weight: Weight: 89.4 kg  Physical Exam: -awake, alert, chronically-ill appearing -normal breathing pattern -PEG in place -opens eyes and track, some verbal response (slow)            Palliative Care Assessment & Plan    Code Status:  Full code Goals of Care:  Patient somewhat improved from previous visits and weeks. Able to respond and will track with eyes. Tolerating feeding tube s/p PEG placement.   Recommend outpatient palliative support at discharge.    PMT will continue to support and follow as needed.   Prognosis: Guarded-Poor (long-term) Discharge Planning: To Be Determined   Thank you for allowing the Palliative Medicine Team to assist in the care of this patient.  Time Total: 25 min.   Visit consisted of counseling and education dealing with the complex and emotionally intense issues of symptom management and palliative care in the setting of serious and potentially life-threatening illness.Greater than 50%  of this time was spent counseling and coordinating care related to the above assessment and plan.  Alda Lea, AGPCNP-BC  Palliative Medicine Team [phone]

## 2019-12-26 NOTE — Evaluation (Signed)
Physical Therapy Evaluation Patient Details Name: Taylor Tyler MRN: [id number] DOB: Feb 15, 1941 Today's Date: 12/26/2019   History of Present Illness  presented to ER secondary to decreased level of responsiveness, hypoxia; admitted for management of toxic metabolic encephalopathy due to sepsis related to UTI, non-healing sacral decubitus ulcer.  Imaging suggestive of osteomyelitis, L4 post-op paraspinal abscess. Hospital course significant for PEG tube placement (5/22) and CT-guided aspiration of posterior paraspinal collection (5/17)  Clinical Impression  Patient with eyes open, generally aware of and partially responsive to external environment; does visually track and acknowledge therapist, intermittently answers questions and verbally interacts with therapist.  Very delayed in processing and interaction.  Patient globally weak and deconditioned throughout all extremities, requiring act assist/passive for movement throughout available range in all joints.  Mild/mod facial grimacing at end ranges, with limited tolerance especially for ROM to R UE (none completed beyond R wrist, hand and elbow).  Patient intermittent asking therapist to "stop", reporting "that's too much"; intermittently closing eyes and refusing interaction with therapist.  Additional mobility deferred at this time as result, though anticipate extensive +2 assist required for bed mobility and OOB efforts.  Will continue to assess/progress as medically appropriate. Would benefit from skilled PT to address above deficits and promote optimal return to PLOF.; recommend transition to STR upon discharge from acute hospitalization.     Follow Up Recommendations SNF    Equipment Recommendations       Recommendations for Other Services       Precautions / Restrictions Precautions Precautions: Fall Restrictions Weight Bearing Restrictions: No Other Position/Activity Restrictions: PEG, stage IV sacral decub      Mobility  Bed  Mobility               General bed mobility comments: unable to tolerate mobility due to facial grimacing/pain with ROM, intermittently asking therapist to "stop", "that's too much"; anticipate extensive assist +2 to complete  Transfers                 General transfer comment: unable to tolerate mobility due to facial grimacing/pain with ROM, intermittently asking therapist to "stop", "that's too much"; anticipate extensive assist +2 to complete  Ambulation/Gait             General Gait Details: unable to tolerate mobility due to facial grimacing/pain with ROM, intermittently asking therapist to "stop", "that's too much"; anticipate extensive assist +2 to complete  Stairs            Wheelchair Mobility    Modified Rankin (Stroke Patients Only)       Balance                                             Pertinent Vitals/Pain Pain Assessment: Faces Faces Pain Scale: Hurts even more Pain Location: R UE, bilat ankle Pain Descriptors / Indicators: Grimacing Pain Intervention(s): Limited activity within patient's tolerance;Monitored during session;Repositioned    Home Living Family/patient expects to be discharged to:: Skilled nursing facility                 Additional Comments: Patient unreliable historian; per previous documentation, at baseline, patient living alone in single-story home with 1-step to enter; intermittent use of SPC/RW with mobility as needed.  Per chart, most recently resident of [facility]    Prior Function Level of Independence: Needs assistance  Comments: Patient unreliable historian; per previous documentation, at baseline, patient living alone in single-story home with 1-step to enter; intermittent use of SPC/RW with mobility as needed     Hand Dominance   Dominant Hand: Right    Extremity/Trunk Assessment   Upper Extremity Assessment Upper Extremity Assessment: (grossly 2+ to 3-/5  throughout bilat UEs, resting tremors; mild increase in tone/resistance R UE)    Lower Extremity Assessment Lower Extremity Assessment: (grossly 2/5 throughout, limited active effort appreciated.  Bilat ankle ROM to neutral only.)       Communication   Communication: (very delayed processing, answering in 1-2 word sentences)  Cognition Arousal/Alertness: Awake/alert Behavior During Therapy: Flat affect                                   General Comments: oriented to self, location as hospital; follows 25-50% simple commands with significant delay in processing time.  Appears somewhat behavioral in responsiveness and level of interaction/participation throughout session      General Comments      Exercises Other Exercises Other Exercises: Supine LE therex, act assist/passive as appropriate: R wrist flex/ext, forearm pronation/supination, flexion/extension, 1x5 (refused further); L UE wrist flex/ext, forearm pronation/supination, elbow flex/ext, shoulder flex/ext/abduct/adduct, 1x10; bilat LE ankle pumps, heel slides, hip abduct/adduct.  Limited active effort/participation with ROM activities. Other Exercises: Noted with boggy end feel and mild discoloration to L > R heel; RN informed/aware and bilat heels floated end of session.   Assessment/Plan    PT Assessment Patient needs continued PT services  PT Problem List         PT Treatment Interventions DME instruction;Gait training;Functional mobility training;Therapeutic activities;Therapeutic exercise;Balance training;Neuromuscular re-education;Cognitive remediation;Patient/family education    PT Goals (Current goals can be found in the Care Plan section)  Acute Rehab PT Goals Patient Stated Goal: to get something to drink PT Goal Formulation: With patient Time For Goal Achievement: 01/09/20 Potential to Achieve Goals: Fair Additional Goals Additional Goal #1: Assess and establish goals for OOB activities as  appropriate.    Frequency Min 2X/week   Barriers to discharge        Co-evaluation               AM-PAC PT "6 Clicks" Mobility  Outcome Measure Help needed turning from your back to your side while in a flat bed without using bedrails?: Total Help needed moving from lying on your back to sitting on the side of a flat bed without using bedrails?: Total Help needed moving to and from a bed to a chair (including a wheelchair)?: Total Help needed standing up from a chair using your arms (e.g., wheelchair or bedside chair)?: Total Help needed to walk in hospital room?: Total Help needed climbing 3-5 steps with a railing? : Total 6 Click Score: 6    End of Session Equipment Utilized During Treatment: Gait belt Activity Tolerance: Patient tolerated treatment well Patient left: with bed alarm set;in bed;with call bell/phone within reach Nurse Communication: Mobility status PT Visit Diagnosis: Muscle weakness (generalized) (M62.81);Difficulty in walking, not elsewhere classified (R26.2);Pain    Time: 2876-8115 PT Time Calculation (min) (ACUTE ONLY): 23 min   Charges:   PT Evaluation $PT Eval Moderate Complexity: 1 Mod PT Treatments $Therapeutic Exercise: 8-22 mins        Rommie Dunn H. Owens Shark, PT, DPT, NCS 12/26/19, 3:44 PM [phone]

## 2019-12-27 LAB — C-REACTIVE PROTEIN: CRP: 3.3 mg/dL — ABNORMAL HIGH (ref <1.0)

## 2019-12-27 LAB — GLUCOSE, CAPILLARY
Glucose-Capillary: 132 mg/dL — ABNORMAL HIGH (ref 70–99)
Glucose-Capillary: 148 mg/dL — ABNORMAL HIGH (ref 70–99)
Glucose-Capillary: 139 mg/dL — ABNORMAL HIGH (ref 70–99)
Glucose-Capillary: 120 mg/dL — ABNORMAL HIGH (ref 70–99)

## 2019-12-27 LAB — SEDIMENTATION RATE: Sed Rate: 54 mm/hr — ABNORMAL HIGH (ref 0–30)

## 2019-12-27 MED ORDER — MEMANTINE HCL 5 MG PO TABS
5.0000 mg | ORAL_TABLET | Freq: Two times a day (BID) | ORAL | Status: DC
  Administered 2019-12-28 – 2019-12-30 (×4): 5 mg
  Filled 2019-12-27 (×4): qty 1

## 2019-12-27 MED ORDER — DONEPEZIL HCL 5 MG PO TABS
5.0000 mg | ORAL_TABLET | Freq: Every day | ORAL | Status: DC
  Administered 2019-12-28 – 2019-12-29 (×2): 5 mg
  Filled 2019-12-27 (×2): qty 1

## 2019-12-27 MED ORDER — FOLIC ACID 1 MG PO TABS
500.0000 ug | ORAL_TABLET | Freq: Every day | ORAL | Status: DC
  Administered 2019-12-28 – 2019-12-30 (×2): 0.5 mg
  Filled 2019-12-27 (×2): qty 1

## 2019-12-27 MED ORDER — CITALOPRAM HYDROBROMIDE 10 MG PO TABS
10.0000 mg | ORAL_TABLET | Freq: Every day | ORAL | Status: DC
  Administered 2019-12-28 – 2019-12-30 (×2): 10 mg
  Filled 2019-12-27 (×3): qty 1

## 2019-12-27 MED ORDER — SULFAMETHOXAZOLE-TRIMETHOPRIM 400-80 MG PO TABS
1.0000 | ORAL_TABLET | Freq: Two times a day (BID) | ORAL | Status: DC
  Administered 2019-12-28 – 2019-12-30 (×5): 1
  Filled 2019-12-27 (×6): qty 1

## 2019-12-27 MED ORDER — RISPERIDONE 0.25 MG PO TABS
0.2500 mg | ORAL_TABLET | Freq: Two times a day (BID) | ORAL | Status: DC
  Administered 2019-12-28 – 2019-12-30 (×4): 0.25 mg
  Filled 2019-12-27 (×6): qty 1

## 2019-12-27 NOTE — TOC Progression Note (Signed)
Transition of Care [facility]) - Progression Note    Patient Details  Name: Taylor Tyler MRN: [id number] Date of Birth: [birth year]  [facility] CM/SW Contact  Loraine Bhullar, Gardiner Rhyme, LCSW Phone Number: 12/27/2019, 12:19 PM  Clinical Narrative:  Have updated FL2 and working on having pt stay up to go to dialysis so when returns to facility she can go to OP-HD three days a week. Have sent updated FL2 to [facility]. Will await medical stability to return.          Expected Discharge Plan and Services                                                 Social Determinants of Health (SDOH) Interventions    Readmission Risk Interventions No flowsheet data found.

## 2019-12-27 NOTE — Progress Notes (Signed)
PT HR increased 120-140s, UF off. Normal saline given, HR decreased to 111.  MD notified.

## 2019-12-27 NOTE — Progress Notes (Signed)
[facility]  ROUNDING NOTE   Subjective:   Feels better today Denies any acute complaints Continues to have large amount of edema More interactive today Tube feeds continued via PEG tube  Objective:  Vital signs in last 24 hours:  Temp:  [97.9 F (36.6 C)-98.6 F (37 C)] 97.9 F (36.6 C) (05/25 0729) Pulse Rate:  [82-86] 86 (05/25 0729) Resp:  [17-20] 17 (05/25 0729) BP: (157-170)/(61-69) 170/66 (05/25 0729) SpO2:  [91 %-100 %] 100 % (05/25 0729)  Weight change:  Filed Weights   12/22/19 0444 12/22/19 0803 12/25/19 0500  Weight: 98.3 kg 98.3 kg 89.4 kg    Intake/Output: I/O last 3 completed shifts: In: 10 [I.V.:10] Out: 0    Intake/Output this shift:  Total I/O In: 10 [I.V.:10] Out: -    Physical Exam: General:  No acute distress, laying in the bed, chronically ill-appearing  HEENT  anicteric, moist oral mucous membrane  Pulm/lungs  normal breathing effort, lungs are clear to auscultation  CVS/Heart  no rub or gallop  Abdomen:   Soft, nontender, PEG tube in place  Extremities:  Dependent edema is present  Neurologic:  able to follow simple commands   Skin:  No acute rashes  Right IJ PermCath   Basic Metabolic Panel: Recent Labs  Lab 12/22/19 0741 12/22/19 0741 12/23/19 1319 12/24/19 0424 12/25/19 0514  NA 137  --  139 140 140  K 2.7*  --  2.9* 2.9* 4.1  CL 102  --  101 103 103  CO2 27  --  29 29 28   GLUCOSE 190*  --  204* 144* 85  BUN 36*  --  35* 44* 27*  CREATININE 2.27*  --  1.83* 1.97* 1.52*  CALCIUM 7.8*   < > 7.9* 7.9* 8.0*  MG  --   --  1.5* 1.9 1.8  PHOS <1.0*  --  1.6* 2.8 3.1   < > = values in this interval not displayed.    Liver Function Tests: Recent Labs  Lab 12/22/19 0741  ALBUMIN 1.6*   No results for input(s): LIPASE, AMYLASE in the last 168 hours. No results for input(s): AMMONIA in the last 168 hours.  CBC: Recent Labs  Lab 12/22/19 0741 12/23/19 1445 12/24/19 1745  WBC 11.9*  --   --   HGB 6.8*  6.8* 10.1*  HCT 24.5*  --  34.4*  MCV 96.1  --   --   PLT 196  --   --     Cardiac Enzymes: No results for input(s): CKTOTAL, CKMB, CKMBINDEX, TROPONINI in the last 168 hours.  BNP: Invalid input(s): POCBNP  CBG: Recent Labs  Lab 12/26/19 0728 12/26/19 1155 12/26/19 1549 12/26/19 2055 12/27/19 0730  GLUCAP 165* 154* 139* 123* 132*    Microbiology: Results for orders placed or performed during the hospital encounter of 12/11/19  Blood Culture (routine x 2)     Status: Abnormal   Collection Time: 12/11/19  7:45 PM   Specimen: BLOOD  Result Value Ref Range Status   Specimen Description   Final    BLOOD RIGHT FA Performed at [facility], [address], [place], [place] [address]    Special Requests   Final    BOTTLES DRAWN AEROBIC AND ANAEROBIC Blood Culture adequate volume Performed at [facility], [address], [place], [place] [address]    Culture  Setup Time   Final    GRAM NEGATIVE RODS IN BOTH AEROBIC AND ANAEROBIC BOTTLES CRITICAL RESULT  CALLED TO, READ BACK BY AND VERIFIED WITH: Rayna Sexton AT 2595 ON 12/12/19 SNG    Culture (A)  Final    PROTEUS MIRABILIS CITROBACTER YOUNGAE SUSCEPTIBILITIES PERFORMED ON PREVIOUS CULTURE WITHIN THE LAST 5 DAYS. Performed at [facility], [place] [address], [place], [place] [address]    Report Status 12/16/2019 FINAL  Final  Blood Culture (routine x 2)     Status: Abnormal   Collection Time: 12/11/19  7:45 PM   Specimen: BLOOD  Result Value Ref Range Status   Specimen Description   Final    BLOOD RIGHT ARM Performed at [facility], [address] [place] [address], [place], [place] [address]    Special Requests   Final    BOTTLES DRAWN AEROBIC AND ANAEROBIC Blood Culture adequate volume Performed at [facility], [address] [place], [place] [address]    Culture  Setup Time   Final    GRAM NEGATIVE RODS IN BOTH AEROBIC AND ANAEROBIC BOTTLES CRITICAL VALUE NOTED.  VALUE IS CONSISTENT  WITH PREVIOUSLY REPORTED AND CALLED VALUE. Performed at [facility], [address] [place], [place] [address]    Culture PROTEUS MIRABILIS [place]  (A)  Final   Report Status 12/16/2019 FINAL  Final   Organism ID, Bacteria PROTEUS MIRABILIS  Final   Organism ID, Bacteria CITROBACTER YOUNGAE  Final      Susceptibility   Citrobacter youngae - MIC*    CEFAZOLIN >=64 RESISTANT Resistant     CEFEPIME <=1 SENSITIVE Sensitive     CEFTAZIDIME >=64 RESISTANT Resistant     CEFTRIAXONE >=64 RESISTANT Resistant     CIPROFLOXACIN <=0.25 SENSITIVE Sensitive     GENTAMICIN <=1 SENSITIVE Sensitive     IMIPENEM 1 SENSITIVE Sensitive     TRIMETH/SULFA <=20 SENSITIVE Sensitive     PIP/TAZO 64 INTERMEDIATE Intermediate     * CITROBACTER YOUNGAE   Proteus mirabilis - MIC*    AMPICILLIN <=2 SENSITIVE Sensitive     CEFAZOLIN <=4 SENSITIVE Sensitive     CEFEPIME <=1 SENSITIVE Sensitive     CEFTAZIDIME <=1 SENSITIVE Sensitive     CEFTRIAXONE <=1 SENSITIVE Sensitive     CIPROFLOXACIN <=0.25 SENSITIVE Sensitive     GENTAMICIN <=1 SENSITIVE Sensitive     IMIPENEM 4 SENSITIVE Sensitive     TRIMETH/SULFA <=20 SENSITIVE Sensitive     AMPICILLIN/SULBACTAM <=2 SENSITIVE Sensitive     PIP/TAZO <=4 SENSITIVE Sensitive     * PROTEUS MIRABILIS  Blood Culture ID Panel (Reflexed)     Status: Abnormal   Collection Time: 12/11/19  7:45 PM  Result Value Ref Range Status   Enterococcus species NOT DETECTED NOT DETECTED Final   Listeria monocytogenes NOT DETECTED NOT DETECTED Final   Staphylococcus species NOT DETECTED NOT DETECTED Final   Staphylococcus aureus (BCID) NOT DETECTED NOT DETECTED Final   Streptococcus species NOT DETECTED NOT DETECTED Final   Streptococcus agalactiae NOT DETECTED NOT DETECTED Final   Streptococcus pneumoniae NOT DETECTED NOT DETECTED Final   Streptococcus pyogenes NOT DETECTED NOT DETECTED Final   Acinetobacter baumannii NOT DETECTED NOT DETECTED Final    Enterobacteriaceae species DETECTED (A) NOT DETECTED Final    Comment: Enterobacteriaceae represent a large family of gram-negative bacteria, not a single organism. CRITICAL RESULT CALLED TO, READ BACK BY AND VERIFIED WITH: Rayna Sexton AT 6301 ON 12/12/19 SNG    Enterobacter cloacae complex NOT DETECTED NOT DETECTED Final   Escherichia coli NOT DETECTED NOT DETECTED Final   Klebsiella oxytoca NOT  DETECTED NOT DETECTED Final   Klebsiella pneumoniae NOT DETECTED NOT DETECTED Final   Proteus species DETECTED (A) NOT DETECTED Final    Comment: CRITICAL RESULT CALLED TO, READ BACK BY AND VERIFIED WITH: Rayna Sexton AT 6720 ON 12/12/19 SNG    Serratia marcescens NOT DETECTED NOT DETECTED Final   Carbapenem resistance NOT DETECTED NOT DETECTED Final   Haemophilus influenzae NOT DETECTED NOT DETECTED Final   Neisseria meningitidis NOT DETECTED NOT DETECTED Final   Pseudomonas aeruginosa NOT DETECTED NOT DETECTED Final   Candida albicans NOT DETECTED NOT DETECTED Final   Candida glabrata NOT DETECTED NOT DETECTED Final   Candida krusei NOT DETECTED NOT DETECTED Final   Candida parapsilosis NOT DETECTED NOT DETECTED Final   Candida tropicalis NOT DETECTED NOT DETECTED Final    Comment: Performed at [facility], [address] [place], [place] [address]  Urine culture     Status: Abnormal   Collection Time: 12/11/19  8:09 PM   Specimen: In/Out Cath Urine  Result Value Ref Range Status   Specimen Description   Final    IN/OUT CATH URINE Performed at [facility], [address], [place], [place] [address]    Special Requests   Final    NONE Performed at [facility], [address] [place], [place] [address]    Culture MULTIPLE SPECIES PRESENT, SUGGEST RECOLLECTION (A)  Final   Report Status 12/12/2019 FINAL  Final  Respiratory Panel by RT PCR (Flu A&B, Covid) - Nasopharyngeal Swab     Status: None   Collection Time: 12/11/19  9:17 PM   Specimen:  Nasopharyngeal Swab  Result Value Ref Range Status   SARS Coronavirus 2 by RT PCR NEGATIVE NEGATIVE Final    Comment: (NOTE) SARS-CoV-2 target nucleic acids are NOT DETECTED. The SARS-CoV-2 RNA is generally detectable in upper respiratoy specimens during the acute phase of infection. The lowest concentration of SARS-CoV-2 viral copies this assay can detect is 131 copies/mL. A negative result does not preclude SARS-Cov-2 infection and should not be used as the sole basis for treatment or other patient management decisions. A negative result may occur with  improper specimen collection/handling, submission of specimen other than nasopharyngeal swab, presence of viral mutation(s) within the areas targeted by this assay, and inadequate number of viral copies (<131 copies/mL). A negative result must be combined with clinical observations, patient history, and epidemiological information. The expected result is Negative. Fact Sheet for Patients:  PinkCheek.be Fact Sheet for Healthcare Providers:  GravelBags.it This test is not yet ap proved or cleared by the [place] FDA and  has been authorized for detection and/or diagnosis of SARS-CoV-2 by FDA under an Emergency Use Authorization (EUA). This EUA will remain  in effect (meaning this test can be used) for the duration of the COVID-19 declaration under Section 564(b)(1) of the Act, 21 U.S.C. section 360bbb-3(b)(1), unless the authorization is terminated or revoked sooner.    Influenza A by PCR NEGATIVE NEGATIVE Final   Influenza B by PCR NEGATIVE NEGATIVE Final    Comment: (NOTE) The Xpert Xpress SARS-CoV-2/FLU/RSV assay is intended as an aid in  the diagnosis of influenza from Nasopharyngeal swab specimens and  should not be used as a sole basis for treatment. Nasal washings and  aspirates are unacceptable for Xpert Xpress SARS-CoV-2/FLU/RSV  testing. Fact Sheet for  Patients: PinkCheek.be Fact Sheet for Healthcare Providers: GravelBags.it This test is not yet approved or cleared by the [place] FDA and  has been  authorized for detection and/or diagnosis of SARS-CoV-2 by  FDA under an Emergency Use Authorization (EUA). This EUA will remain  in effect (meaning this test can be used) for the duration of the  Covid-19 declaration under Section 564(b)(1) of the Act, 21  U.S.C. section 360bbb-3(b)(1), unless the authorization is  terminated or revoked. Performed at [facility], [address] [place], [place] [address]   MRSA PCR Screening     Status: None   Collection Time: 12/12/19 12:29 AM   Specimen: Nasopharyngeal  Result Value Ref Range Status   MRSA by PCR NEGATIVE NEGATIVE Final    Comment:        The GeneXpert MRSA Assay (FDA approved for NASAL specimens only), is one component of a comprehensive MRSA colonization surveillance program. It is not intended to diagnose MRSA infection nor to guide or monitor treatment for MRSA infections. Performed at [facility], [address] [place], [place] [address]   Aerobic/Anaerobic Culture (surgical/deep wound)     Status: None   Collection Time: 12/12/19  2:06 AM   Specimen: Sacral; Wound  Result Value Ref Range Status   Specimen Description   Final    SACRAL Performed at [facility], [address], [place], [place] [address]    Special Requests   Final    NONE Performed at [facility], [place], [place] [address]    Gram Stain NO WBC SEEN NO ORGANISMS SEEN   Final   Culture   Final    FEW ESCHERICHIA COLI FEW CITROBACTER SPECIES FEW PROTEUS MIRABILIS FEW BACTEROIDES OVATUS BETA LACTAMASE POSITIVE Performed at [facility], [place] [address], [place], [place] [address]    Report Status 12/15/2019 FINAL  Final   Organism ID, Bacteria ESCHERICHIA COLI  Final    Organism ID, Bacteria CITROBACTER SPECIES  Final   Organism ID, Bacteria PROTEUS MIRABILIS  Final      Susceptibility   Citrobacter species - MIC*    CEFAZOLIN >=64 RESISTANT Resistant     CEFEPIME <=1 SENSITIVE Sensitive     CEFTAZIDIME >=64 RESISTANT Resistant     CEFTRIAXONE >=64 RESISTANT Resistant     CIPROFLOXACIN <=0.25 SENSITIVE Sensitive     GENTAMICIN <=1 SENSITIVE Sensitive     IMIPENEM <=0.25 SENSITIVE Sensitive     TRIMETH/SULFA <=20 SENSITIVE Sensitive     PIP/TAZO 64 INTERMEDIATE Intermediate     * FEW CITROBACTER SPECIES   Escherichia coli - MIC*    AMPICILLIN 16 INTERMEDIATE Intermediate     CEFAZOLIN >=64 RESISTANT Resistant     CEFEPIME <=1 SENSITIVE Sensitive     CEFTAZIDIME <=1 SENSITIVE Sensitive     CEFTRIAXONE <=1 SENSITIVE Sensitive     CIPROFLOXACIN <=0.25 SENSITIVE Sensitive     GENTAMICIN <=1 SENSITIVE Sensitive     IMIPENEM <=0.25 SENSITIVE Sensitive     TRIMETH/SULFA <=20 SENSITIVE Sensitive     AMPICILLIN/SULBACTAM 4 SENSITIVE Sensitive     PIP/TAZO <=4 SENSITIVE Sensitive     * FEW ESCHERICHIA COLI   Proteus mirabilis - MIC*    AMPICILLIN <=2 SENSITIVE Sensitive     CEFAZOLIN <=4 SENSITIVE Sensitive     CEFEPIME <=1 SENSITIVE Sensitive     CEFTAZIDIME <=1 SENSITIVE Sensitive     CEFTRIAXONE <=1 SENSITIVE Sensitive     CIPROFLOXACIN <=0.25 SENSITIVE Sensitive     GENTAMICIN 2 SENSITIVE Sensitive     IMIPENEM 4 SENSITIVE Sensitive     TRIMETH/SULFA <=20 SENSITIVE Sensitive  AMPICILLIN/SULBACTAM <=2 SENSITIVE Sensitive     PIP/TAZO <=4 SENSITIVE Sensitive     * FEW PROTEUS MIRABILIS  Aerobic/Anaerobic Culture (surgical/deep wound)     Status: None   Collection Time: 12/12/19  2:06 AM   Specimen: Back; Wound  Result Value Ref Range Status   Specimen Description   Final    BACK LUMBAR SPINE Performed at [facility], [address] [place], [place] [address]    Special Requests   Final    NONE Performed at [facility], [place], [place] [address]    Gram Stain NO WBC SEEN NO ORGANISMS SEEN   Final   Culture   Final    FEW PROTEUS MIRABILIS NO ANAEROBES ISOLATED Performed at [facility], [place] [address] [place], [place] [address]    Report Status 12/17/2019 FINAL  Final   Organism ID, Bacteria PROTEUS MIRABILIS  Final      Susceptibility   Proteus mirabilis - MIC*    AMPICILLIN <=2 SENSITIVE Sensitive     CEFAZOLIN <=4 SENSITIVE Sensitive     CEFEPIME <=1 SENSITIVE Sensitive     CEFTAZIDIME <=1 SENSITIVE Sensitive     CEFTRIAXONE <=1 SENSITIVE Sensitive     CIPROFLOXACIN <=0.25 SENSITIVE Sensitive     GENTAMICIN 2 SENSITIVE Sensitive     IMIPENEM 4 SENSITIVE Sensitive     TRIMETH/SULFA <=20 SENSITIVE Sensitive     AMPICILLIN/SULBACTAM <=2 SENSITIVE Sensitive     PIP/TAZO <=4 SENSITIVE Sensitive     * FEW PROTEUS MIRABILIS  Aerobic/Anaerobic Culture (surgical/deep wound)     Status: None   Collection Time: 12/19/19  3:08 PM   Specimen: Abscess  Result Value Ref Range Status   Specimen Description   Final    ABSCESS Performed at [facility], [address], [place], [place] [address]    Special Requests   Final    NONE Performed at [facility], [address] [place], [place] [address]    Gram Stain   Final    ABUNDANT WBC PRESENT, PREDOMINANTLY PMN RARE GRAM POSITIVE RODS RARE GRAM NEGATIVE RODS    Culture   Final    FEW ESCHERICHIA COLI MODERATE PROTEUS MIRABILIS NO ANAEROBES ISOLATED Performed at [facility], [place] [address], [place], [place] [address]    Report Status 12/24/2019 FINAL  Final   Organism ID, Bacteria ESCHERICHIA COLI  Final   Organism ID, Bacteria PROTEUS MIRABILIS  Final      Susceptibility   Escherichia coli - MIC*    AMPICILLIN >=32 RESISTANT Resistant     CEFAZOLIN >=64 RESISTANT Resistant     CEFEPIME <=1 SENSITIVE Sensitive     CEFTAZIDIME >=64 RESISTANT Resistant     CEFTRIAXONE >=64 RESISTANT  Resistant     CIPROFLOXACIN <=0.25 SENSITIVE Sensitive     GENTAMICIN <=1 SENSITIVE Sensitive     IMIPENEM 1 SENSITIVE Sensitive     TRIMETH/SULFA <=20 SENSITIVE Sensitive     AMPICILLIN/SULBACTAM >=32 RESISTANT Resistant     PIP/TAZO 64 INTERMEDIATE Intermediate     * FEW ESCHERICHIA COLI   Proteus mirabilis - MIC*    AMPICILLIN >=32 RESISTANT Resistant     CEFAZOLIN >=64 RESISTANT Resistant     CEFEPIME RESISTANT Resistant     CEFTAZIDIME >=64 RESISTANT Resistant     CEFTRIAXONE >=64 RESISTANT Resistant     CIPROFLOXACIN <=0.25 SENSITIVE Sensitive     GENTAMICIN <=1 SENSITIVE Sensitive     IMIPENEM 4  SENSITIVE Sensitive     TRIMETH/SULFA <=20 SENSITIVE Sensitive     AMPICILLIN/SULBACTAM <=2 SENSITIVE Sensitive     PIP/TAZO <=4 SENSITIVE Sensitive     * MODERATE PROTEUS MIRABILIS  Culture, blood (Routine X 2) w Reflex to ID Panel     Status: None (Preliminary result)   Collection Time: 12/26/19  1:25 PM   Specimen: BLOOD  Result Value Ref Range Status   Specimen Description BLOOD BLOOD RIGHT WRIST  Final   Special Requests   Final    BOTTLES DRAWN AEROBIC ONLY Blood Culture results may not be optimal due to an inadequate volume of blood received in culture bottles   Culture   Final    NO GROWTH < 24 HOURS Performed at [facility], [address] [place], [place] [address]    Report Status PENDING  Incomplete  Culture, blood (Routine X 2) w Reflex to ID Panel     Status: None (Preliminary result)   Collection Time: 12/26/19  3:48 PM   Specimen: BLOOD  Result Value Ref Range Status   Specimen Description BLOOD [facility]  Final   Special Requests   Final    BOTTLES DRAWN AEROBIC AND ANAEROBIC Blood Culture results may not be optimal due to an inadequate volume of blood received in culture bottles   Culture   Final    NO GROWTH < 24 HOURS Performed at [facility], [address] [place], [place] [address]    Report Status PENDING  Incomplete     Coagulation Studies: No results for input(s): LABPROT, INR in the last 72 hours.  Urinalysis: No results for input(s): COLORURINE, LABSPEC, PHURINE, GLUCOSEU, HGBUR, BILIRUBINUR, KETONESUR, PROTEINUR, UROBILINOGEN, NITRITE, LEUKOCYTESUR in the last 72 hours.  Invalid input(s): APPERANCEUR    Imaging: No results found.   Medications:   . sodium chloride Stopped (12/25/19 1756)  . albumin human Stopped (12/22/19 1346)  . feeding supplement (JEVITY 1.5 CAL/FIBER) 1,000 mL (12/26/19 1742)   . atorvastatin  40 mg Oral q1800  . chlorhexidine  15 mL Mouth Rinse BID  . Chlorhexidine Gluconate Cloth  6 each Topical Q0600  . citalopram  10 mg Oral Daily  . donepezil  5 mg Oral QHS  . epoetin (EPOGEN/PROCRIT) injection  10,000 Units Intravenous Q T,Th,Sa-HD  . feeding supplement (PRO-STAT SUGAR FREE 64)  30 mL Per Tube BID  . folic acid  638 mcg Oral Daily  . heparin injection (subcutaneous)  5,000 Units Subcutaneous Q8H  . insulin aspart  0-5 Units Subcutaneous QHS  . insulin aspart  0-9 Units Subcutaneous TID WC  . levothyroxine  100 mcg Per Tube Q0600  . mouth rinse  15 mL Mouth Rinse q12n4p  . memantine  5 mg Oral BID  . midodrine  5 mg Per Tube TID WC  . pantoprazole sodium  40 mg Per Tube Daily  . phosphorus  500 mg Per Tube BID  . risperiDONE  0.25 mg Oral BID  . sevelamer carbonate  800 mg Oral TID WC  . sodium chloride flush  10-40 mL Intracatheter Q12H  . sulfamethoxazole-trimethoprim  1 tablet Oral Q12H   acetaminophen, docusate sodium, HYDROcodone-acetaminophen, ipratropium-albuterol, ondansetron (ZOFRAN) IV, polyethylene glycol, sodium chloride flush  Assessment/ Plan:   Taylor Tyler is a 79 y.o. black female with end stage renal disease on hemodialysis, dementia, diabetes mellitus type II, hypertension, hypothyroidism, osteomyelitis, admitted to [facility] on 12/11/2019 for Encephalopathy acute [G93.40] Urinary tract infection, acute [N39.0] Osteomyelitis,  unspecified site,  unspecified type [facility]) [M86.9]  [facility] ([place]) [facility] [address] TTS RIJ 79kg.  #.  ESRD on HD TTS:   Next hemodialysis scheduled for Tuesday. Ultrafiltration goal 1.5 to 2 kg Dialysis in chair today  #.  Anemia of chronic kidney disease.  Lab Results  Component Value Date   HGB 10.1 (L) 12/24/2019  - transfusion as per hospitalist.  - EPO with HD treatment.  - Appreciate GI input.   #  Secondary hyperparathyroidism: Lab Results  Component Value Date   PTH 68 (H) 12/13/2019   CALCIUM 8.0 (L) 12/25/2019   CAION 1.11 (L) 06/23/2019   PHOS 3.1 12/25/2019  Monitor calcium and phosphorus during this admission   #Sepsis with stage IV sacral decubitus wound infection with abscess and underlying osteomyelitis, Proteus and Citrobacter bacteremia Status post debridement of necrotic sacral wound Status post CT-guided aspiration  Overall prognosis is very poor despite aggressive measures. Appreciate palliative care input.    LOS: [place] 5/25/202110:21 AM

## 2019-12-27 NOTE — Progress Notes (Signed)
Pt was moved from bed to chair with the lift staring 11am. Pt was moved to dialysis on the chair, tolerate the entire session sitting until she came back in the unit at 5pm.

## 2019-12-27 NOTE — Plan of Care (Signed)
  Problem: Nutrition: Goal: Adequate nutrition will be maintained Outcome: Progressing   Problem: Coping: Goal: Level of anxiety will decrease Outcome: Progressing   Problem: Health Behavior/Discharge Planning: Goal: Ability to manage health-related needs will improve Outcome: Not Progressing Note: Pt requires assistance with all ADLs/IADLs   Problem: Activity: Goal: Risk for activity intolerance will decrease Outcome: Not Progressing Note: Pt requires assistance with all ADLs/IADLs

## 2019-12-27 NOTE — Progress Notes (Signed)
Pre HD  

## 2019-12-27 NOTE — Progress Notes (Signed)
Post HD  

## 2019-12-27 NOTE — Progress Notes (Signed)
HD started. 

## 2019-12-27 NOTE — Progress Notes (Signed)
HD tx ended 

## 2019-12-27 NOTE — Progress Notes (Signed)
[facility] [place] NAME: Jackalynn Art    MR#:  [id number]  DATE OF BIRTH:  21-Jun-1941  SUBJECTIVE:   Patient opens eyes to verbal command. Did tell me good morning today. When asked where she is she nodded yes to hospital.  Staff working to get her to chair with lift  started PEG feeding --tolerating well REVIEW OF SYSTEMS:   Review of Systems  Unable to perform ROS: Mental status change   Tolerating Diet:No --on NG feeds Tolerating PT: bedbound  DRUG ALLERGIES:   Allergies  Allergen Reactions  . Clams [Shellfish Allergy] Swelling and Other (See Comments)    THROAT SWELLS NECK TURNS RED  . Hydralazine Other (See Comments)    CHEST TIGHTNESS Patient has tolerated multiple doses of hydralazine since allergy was listed   . Norvasc [Amlodipine Besylate] Swelling    Leg edema   . Milk-Related Compounds Diarrhea    VITALS:  Blood pressure (!) 170/66, pulse 86, temperature 97.9 F (36.6 C), resp. rate 17, height 5\' 6"  (1.676 m), weight 89.4 kg, SpO2 100 %.  PHYSICAL EXAMINATION:   Physical Exam  GENERAL:  79 y.o.-year-old patient lying in the bed with no acute distress. Obese, chornically ill EYES: Pupils equal, round, reactive to light and accommodation. No scleral icterus.   SKIN: Stage IV sacral decubitus ulcer, deep tissue injury of the left heel (present on admission) Pressure Injury 11/19/19 Sacrum Mid;Right Unstageable - Full thickness tissue loss in which the base of the injury is covered by slough (yellow, tan, gray, green or brown) and/or eschar (tan, brown or black) in the wound bed. deep wound with bed covered  (Active)  11/19/19 1800  Location: Sacrum  Location Orientation: Mid;Right  Staging: Unstageable - Full thickness tissue loss in which the base of the injury is covered by slough (yellow, tan, gray, green or brown) and/or eschar (tan, brown or black) in the wound bed.  Wound Description (Comments): deep wound  with bed covered by slough  Present on Admission: Yes     Pressure Injury 11/19/19 Heel Left Deep Tissue Pressure Injury - Purple or maroon localized area of discolored intact skin or blood-filled blister due to damage of underlying soft tissue from pressure and/or shear. (Active)  11/19/19 1800  Location: Heel  Location Orientation: Left  Staging: Deep Tissue Pressure Injury - Purple or maroon localized area of discolored intact skin or blood-filled blister due to damage of underlying soft tissue from pressure and/or shear.  Wound Description (Comments):   Present on Admission: Yes   EYES: EOMI ENT: HD catheter+ neck CV: RRR PULM: CTA B ABD: soft, obese, NT, +BS PEG+ CNS: She is awake but she does follow some commands.  EXT: Bilateral ankle edema, no tenderness  LABORATORY PANEL:  CBC Recent Labs  Lab 12/22/19 0741 12/23/19 1445 12/24/19 1745  WBC 11.9*  --   --   HGB 6.8*   < > 10.1*  HCT 24.5*  --  34.4*  PLT 196  --   --    < > = values in this interval not displayed.    Chemistries  Recent Labs  Lab 12/25/19 0514  NA 140  K 4.1  CL 103  CO2 28  GLUCOSE 85  BUN 27*  CREATININE 1.52*  CALCIUM 8.0*  MG 1.8   Cardiac Enzymes No results for input(s): TROPONINI in the last 168 hours. RADIOLOGY:  No results found. ASSESSMENT AND PLAN:  Bo Merino  is an 79 y.o. female with a history ofend-stage renal disease, hypertension, diabetes, history of lumbar fusion, laminectomy 2014, with revision in 5379 complicated by wound infectiondue to Proteus  S/p septic shock/stage IV sacral decubitus wound infection with abscess and underlying osteomyelitis/Proteus and Citrobacter bacteremia -s/p debridement of necrotic sacral wound. - S/p CT-guided aspiration of 20 mls posterior paraspinal fluid collection.  -recieved IV meropenem while in the hospital and patient now on Po Bactrim indefinitely-appreciate ID recommendations. -5/24>> repeat BC pending  Acute  toxic/metabolic encephalopathy: - Patient's mentation improving-- saying few things--taking longer to respond - Continue supportive care  Dysphagia -Patient was on enteral nutrition via NG tube.  -Plan of care was discussed with her son, [name].  He wants to go forward with PEG tube placement for enteral nutrition. - Consulted gastroenterologist, Dr. Prescott Gum -status post peg tube placement by Dr. Alice Reichert 12/24/2019 -d/w dietitian-- resumed TF  Hypokalemia and hypophosphatemia--repeleted.    ESRD: Follow-up with nephrologist for hemodialysis.resume sevelamer -Patient will need to sit up for dialysis. PT consult appreciated  Anemia of chronic disease: Continue epoetin.  Monitor H&H. -Hemoglobin down to 6.8--- transfused one unit on 5/22--son [facility] aware -hgb 10.1  Hypothyroidism:  -synthroid  Dementia resumed aricept, risperdal and nameda  Unstageable sacral decubitus ulcer and left heel deep tissuinjury (present on admission): Continue local wound care per wound nurse's recommendation.  Turn patient every 2 hours.  Overall poor prognosis long term, slow improvement Palliative care input appreciated--pt is FULL code, will need to follow with Palliative care  DVT prophylaxis: Heparin Code Status: Full code Family Communication: son Ollen Gross --left message on 5/25 Disposition Plan: to [facility] once pt able to sit for HD   Status is: Inpatient  Remains inpatient appropriate because:Altered mental status PEG placement done--now  on PEG feeding from 5/23. Pt will also need to sit up for HD--quiet challenging with pt's mentation. PT consulted   Dispo: The patient is from: SNF  Anticipated d/c is to: [facility]  Anticipated d/c date is: unsure  Patient currently is not medically stable to d/c.   TOTAL TIME TAKING CARE OF THIS PATIENT: 25 minutes.  >50% time spent on counselling and coordination of care  Note: This dictation was prepared with  Dragon dictation along with smaller phrase technology. Any transcriptional errors that result from this process are unintentional.  Fritzi Mandes M.D    [facility]   CC: Primary care physician; McLean-Scocuzza, Nino Glow, MDPatient ID: Bo Merino, female   DOB: 05/12/1941, 79 y.o.   MRN: [id number]

## 2019-12-27 NOTE — NC FL2 (Signed)
[place] LEVEL OF CARE SCREENING TOOL     IDENTIFICATION  Patient Name: Taylor Tyler Birthdate: 06/07/1941 Sex: female Admission Date (Current Location): 12/11/2019  [place] and [place] Number:  Selena Lesser [id number] T Facility and Address:  [facility], [address] [place], [place] [address]      Provider Number: [id number]  Attending Physician Name and Address:  Fritzi Mandes, MD  Relative Name and Phone Number:  Arnetta Odeh [phone]    Current Level of Care: Hospital Recommended Level of Care: [place] Prior Approval Number:    Date Approved/Denied:   PASRR Number: [id number]  Discharge Plan: SNF    Current Diagnoses: Patient Active Problem List   Diagnosis Date Noted  . Failure to thrive in adult   . Osteomyelitis ([place])   . Goals of care, counseling/discussion   . Palliative care by specialist   . DNR (do not resuscitate) discussion   . Sacral decubitus ulcer, stage IV ([place]) 12/12/2019  . Severe sepsis with septic shock ([place]) 12/12/2019  . Encephalopathy acute 12/11/2019  . Wound infection 11/21/2019  . Leukocytosis 11/19/2019  . Acute metabolic encephalopathy 33/82/5053  . Dementia without behavioral disturbance ([place]) 11/03/2019  . History of anemia due to chronic kidney disease 11/03/2019  . ESRD (end stage renal disease) ([place]) 07/22/2019  . Fever in adult   . Pressure injury of skin 06/08/2019  . Acute CHF (congestive heart failure) ([place]) 05/30/2019  . Infection and inflammatory reaction due to internal fixation device of spine, initial encounter ([place]) 06/05/2018  . Neuropathy 01/08/2018  . Hypokalemia 01/08/2018  . Fatty liver 01/05/2018  . Chronic pain of both knees 01/05/2018  . Ventral hernia 01/05/2018  . CKD (chronic kidney disease) stage 4, GFR 15-29 ml/min (HCC) 07/21/2017  . Vitamin D deficiency 07/21/2017  . Essential hypertension   . Postoperative wound infection 04/02/2017   . Anemia of chronic disease 03/14/2017  . Acute blood loss anemia 03/14/2017  . Acute renal failure superimposed on stage 3 chronic kidney disease ([place]) 03/14/2017  . Bilateral leg edema 03/04/2017  . Obesity, Class III, BMI 40-49.9 (morbid obesity) ([place]) 02/27/2017  . DM (diabetes mellitus), type 2 with renal complications ([place]) 97/67/3419  . Status post lumbar surgery 02/24/2017  . CKD (chronic kidney disease) stage 3, GFR 30-59 ml/min 01/15/2017  . Hypothyroidism 01/15/2017  . Diverticulosis 01/15/2017  . HLD (hyperlipidemia) 01/15/2017  . Spinal stenosis of lumbar region 01/15/2017  . Disease of pancreas 07/21/2012    Orientation RESPIRATION BLADDER Height & Weight     Self, Place  Normal Incontinent Weight: 197 lb 1.5 oz (89.4 kg) Height:  5\' 6"  (167.6 cm)  BEHAVIORAL SYMPTOMS/MOOD NEUROLOGICAL BOWEL NUTRITION STATUS      Incontinent Feeding tube(Jevity 1.5 cal 1,000 ml 24 hr)  AMBULATORY STATUS COMMUNICATION OF NEEDS Skin   Total Care Verbally PU Stage and Appropriate Care       PU Stage 4 Dressing: Daily               Personal Care Assistance Level of Assistance  Bathing, Feeding, Dressing Bathing Assistance: Maximum assistance Feeding assistance: Maximum assistance Dressing Assistance: Maximum assistance     Functional Limitations Info  Sight, Speech Sight Info: Adequate Hearing Info: Adequate Speech Info: Impaired    SPECIAL CARE FACTORS FREQUENCY  Speech therapy             Speech Therapy Frequency: 1 x week      Contractures Contractures Info: Not present  Additional Factors Info  Code Status, Allergies, Isolation Precautions Code Status Info: Full Code Allergies Info: Clams-shellfish, Hydralazine, Norvasc, Amlodipine, Besylate, Milk related compounds     Isolation Precautions Info: MRSA     Current Medications (12/27/2019):  This is the current hospital active medication list Current Facility-Administered Medications  Medication Dose  Route Frequency Provider Last Rate Last Admin  . 0.9 %  sodium chloride infusion  250 mL Intravenous Continuous Delman Kitten, MD   Stopped at 12/25/19 1756  . acetaminophen (TYLENOL) tablet 650 mg  650 mg Oral Q4H PRN Awilda Bill, NP   650 mg at 12/23/19 1011  . albumin human 25 % solution 25 g  25 g Intravenous Q T,Th,Sa-HD Anthonette Legato, MD   Stopped at 12/22/19 1346  . atorvastatin (LIPITOR) tablet 40 mg  40 mg Oral q1800 Fritzi Mandes, MD   40 mg at 12/26/19 1733  . chlorhexidine (PERIDEX) 0.12 % solution 15 mL  15 mL Mouth Rinse BID Flora Lipps, MD   15 mL at 12/27/19 0909  . Chlorhexidine Gluconate Cloth 2 % PADS 6 each  6 each Topical Q0600 Awilda Bill, NP   6 each at 12/26/19 4193  . citalopram (CELEXA) tablet 10 mg  10 mg Oral Daily Fritzi Mandes, MD   10 mg at 12/27/19 0911  . docusate sodium (COLACE) capsule 100 mg  100 mg Oral BID PRN Awilda Bill, NP      . donepezil (ARICEPT) tablet 5 mg  5 mg Oral QHS Fritzi Mandes, MD   5 mg at 12/26/19 2145  . epoetin alfa (EPOGEN) injection 10,000 Units  10,000 Units Intravenous Q T,Th,Sa-HD Holley Raring, [place], MD   10,000 Units at 12/24/19 1345  . feeding supplement (JEVITY 1.5 CAL/FIBER) liquid 1,000 mL  1,000 mL Per Tube Continuous Fritzi Mandes, MD 50 mL/hr at 12/26/19 1742 1,000 mL at 12/26/19 1742  . feeding supplement (PRO-STAT SUGAR FREE 64) liquid 30 mL  30 mL Per Tube BID Loletha Grayer, MD   30 mL at 12/27/19 0918  . folic acid (FOLVITE) tablet 0.5 mg  500 mcg Oral Daily Fritzi Mandes, MD   0.5 mg at 12/27/19 0909  . heparin injection 5,000 Units  5,000 Units Subcutaneous Q8H Fritzi Mandes, MD   5,000 Units at 12/27/19 0526  . HYDROcodone-acetaminophen (HYCET) 7.5-325 mg/15 ml solution 10 mL  10 mL Oral Q6H PRN Wieting, Richard, MD      . insulin aspart (novoLOG) injection 0-5 Units  0-5 Units Subcutaneous QHS Fritzi Mandes, MD      . insulin aspart (novoLOG) injection 0-9 Units  0-9 Units Subcutaneous TID WC Fritzi Mandes, MD   1 Units  at 12/27/19 [phone]  . ipratropium-albuterol (DUONEB) 0.5-2.5 (3) MG/3ML nebulizer solution 3 mL  3 mL Nebulization Q6H PRN Flora Lipps, MD      . levothyroxine (SYNTHROID) tablet 100 mcg  100 mcg Per Tube [id number] Fritzi Mandes, MD   100 mcg at 12/27/19 0526  . MEDLINE mouth rinse  15 mL Mouth Rinse q12n4p Flora Lipps, MD   15 mL at 12/26/19 1733  . memantine (NAMENDA) tablet 5 mg  5 mg Oral BID Fritzi Mandes, MD   5 mg at 12/27/19 0909  . midodrine (PROAMATINE) tablet 5 mg  5 mg Per Tube TID WC Kolluru, Sarath, MD   5 mg at 12/27/19 0909  . ondansetron (ZOFRAN) injection 4 mg  4 mg Intravenous Q6H PRN Awilda Bill, NP      .  pantoprazole sodium (PROTONIX) 40 mg/20 mL oral suspension 40 mg  40 mg Per Tube Daily Fritzi Mandes, MD   40 mg at 12/27/19 0918  . phosphorus (K PHOS NEUTRAL) tablet 500 mg  500 mg Per Tube BID Kolluru, Sarath, MD   500 mg at 12/27/19 0910  . polyethylene glycol (MIRALAX / GLYCOLAX) packet 17 g  17 g Oral Daily PRN Awilda Bill, NP      . risperiDONE (RISPERDAL) tablet 0.25 mg  0.25 mg Oral BID Fritzi Mandes, MD   0.25 mg at 12/27/19 0910  . sevelamer carbonate (RENVELA) tablet 800 mg  800 mg Oral TID WC Fritzi Mandes, MD   800 mg at 12/27/19 0910  . sodium chloride flush (NS) 0.9 % injection 10-40 mL  10-40 mL Intracatheter Q12H Awilda Bill, NP   10 mL at 12/27/19 0919  . sodium chloride flush (NS) 0.9 % injection 10-40 mL  10-40 mL Intracatheter PRN Awilda Bill, NP      . sulfamethoxazole-trimethoprim (BACTRIM) 400-80 MG per tablet 1 tablet  1 tablet Oral Q12H Tsosie Billing, MD   1 tablet at 12/27/19 0911     Discharge Medications: Please see discharge summary for a list of discharge medications.  Relevant Imaging Results:  Relevant Lab Results:   Additional Information SSN: [id number] HD [place] T,TH,Sat 9:20 am On low air loss mattress due to skin issues  Ohana Birdwell, Gardiner Rhyme, LCSW

## 2019-12-28 LAB — GLUCOSE, CAPILLARY
Glucose-Capillary: 117 mg/dL — ABNORMAL HIGH (ref 70–99)
Glucose-Capillary: 124 mg/dL — ABNORMAL HIGH (ref 70–99)
Glucose-Capillary: 120 mg/dL — ABNORMAL HIGH (ref 70–99)
Glucose-Capillary: 110 mg/dL — ABNORMAL HIGH (ref 70–99)

## 2019-12-28 LAB — SARS CORONAVIRUS 2 BY RT PCR (HOSPITAL ORDER, PERFORMED IN ~~LOC~~ HOSPITAL LAB): SARS Coronavirus 2: NEGATIVE

## 2019-12-28 NOTE — Op Note (Signed)
[facility] Gastroenterology Patient Name: Taylor Tyler Procedure Date: 12/24/2019 7:30 AM MRN: [id number] Account [id number] Date of Birth: Dec 14, 1940 Admit Type: Inpatient Age: 79 Room: [facility] ENDO ROOM 4 Gender: Female Note Status: Finalized Procedure:             Upper GI endoscopy with PEG placement Indications:           Place PEG Providers:             [name]. Alice Reichert MD, MD, Lilla Shook. [place], [place]                         (Assisting Doctor) Referring MD:          Nino Glow Mclean-Scocuzza MD, MD (Referring MD) Medicines:             Propofol per Anesthesia Complications:         No immediate complications. Estimated blood loss:                         Minimal. Procedure:             Pre-Anesthesia Assessment:                        - The risks and benefits of the procedure and the                         sedation options and risks were discussed with the                         patient. All questions were answered and informed                         consent was obtained.                        - Patient identification and proposed procedure were                         verified prior to the procedure by the nurse. The                         procedure was verified in the procedure room.                        - ASA Grade Assessment: IV - A patient with severe                         systemic disease that is a constant threat to life.                        - After reviewing the risks and benefits, the patient                         was deemed in satisfactory condition to undergo the                         procedure.  After obtaining informed consent, the endoscope was                         passed under direct vision. Throughout the procedure,                         the patient's blood pressure, pulse, and oxygen                         saturations were monitored continuously. The Endoscope                         was introduced  through the mouth, and advanced to the                         third part of duodenum. The upper GI endoscopy was                         accomplished without difficulty. The patient tolerated                         the procedure well. Findings:      The esophagus was normal.      The stomach was normal.      The examined duodenum was normal.      The patient was placed in the supine position for PEG placement. The       stomach was insufflated to appose gastric and abdominal walls. A site       was located in the body of the stomach with excellent transillumination       and manual external pressure for placement. The abdominal wall was       marked and prepped in a sterile manner. The area was anesthetized with 5       mL of 1% lidocaine. The trocar needle was introduced through the       abdominal wall and into the stomach under direct endoscopic view. A       snare was introduced through the endoscope and opened in the gastric       lumen. The guide wire was passed through the trocar and into the open       snare. The snare was closed around the guide wire. The endoscope and       snare were removed, pulling the wire out through the mouth. A skin       incision was made at the site of needle insertion. The externally       removable 20 Fr EndoVive Safety gastrostomy tube was lubricated. The       G-tube was tied to the guide wire and pulled through the mouth and into       the stomach. The trocar needle was removed, and the gastrostomy tube was       pulled out from the stomach through the skin. The external bumper was       attached to the gastrostomy tube, and the tube was cut to remove the       guide wire. The final position of the gastrostomy tube was confirmed by       relook endoscopy, and skin marking noted to be 3 cm at the external       bumper. The final tension and  compression of the abdominal wall by the       PEG tube and external bumper were checked and revealed that  the bumper       was moderately tight and mildly deforming the skin. The feeding tube was       capped, and the tube site cleaned and dressed. Impression:            - Normal esophagus.                        - Normal stomach.                        - Normal examined duodenum.                        - An externally removable PEG placement was                         successfully completed.                        - No specimens collected. Recommendation:        - Return patient to hospital ward for ongoing care.                        - Please follow the post-PEG recommendations                         including: Nutrition consult for formula and volume,                         change dressing once per day, may use PEG today for                         meds and water, may use PEG tomorrow for feedings and                         antibiotic ointment to site. Procedure Code(s):     --- Professional ---                        [phone], Esophagogastroduodenoscopy, flexible,                         transoral; with directed placement of percutaneous                         gastrostomy tube Diagnosis Code(s):     --- Professional ---                        Z43.1, Encounter for attention to gastrostomy CPT copyright 2019 [employer] Association. All rights reserved. The codes documented in this report are preliminary and upon coder review may  be revised to meet current compliance requirements. Efrain Sella MD, MD 12/24/2019 9:23:51 AM This report has been signed electronically. Lilla Shook. [place], [place] Number of Addenda: 0 Note Initiated On: 12/24/2019 7:30 AM Estimated Blood Loss:  Estimated blood loss was minimal.      [facility]

## 2019-12-28 NOTE — Progress Notes (Signed)
Physical Therapy Treatment Patient Details Name: Taylor Tyler MRN: [id number] DOB: 09-Nov-1940 Today's Date: 12/28/2019    History of Present Illness presented to ER secondary to decreased level of responsiveness, hypoxia; admitted for management of toxic metabolic encephalopathy due to sepsis related to UTI, non-healing sacral decubitus ulcer.  Imaging suggestive of osteomyelitis, L4 post-op paraspinal abscess. Hospital course significant for PEG tube placement (5/22) and CT-guided aspiration of posterior paraspinal collection (5/17)    PT Comments    Pt was long sitting in bed upon arriving. She does talk very limited amount during session with max encouragement to respond to questions. She did state she like to be called " Taylor Tyler" and is agreeable to PT session with encouragement. Demonstrates limited AROM in all extremities. +2 total assist to safely progress to EOB short sit with constant mod assist to prevent LOB. Sat EOB x several minutes prior to pt requesting to return to supine. She was repositioned in bed +2 assist. RN aware of her abilities and continued recommendation for use of hoyer lift for any/all OOB transfers. Acute PT recommends DC to SNF to address deficits and assist pt to PLOF.     Follow Up Recommendations  SNF     Equipment Recommendations  Other (comment)(defer to next level of care)    Recommendations for Other Services       Precautions / Restrictions Precautions Precautions: Fall Restrictions Weight Bearing Restrictions: No Other Position/Activity Restrictions: PEG, stage IV sacral decub    Mobility  Bed Mobility Overal bed mobility: Needs Assistance Bed Mobility: Supine to Sit;Sit to Supine     Supine to sit: +2 for safety/equipment;+2 for physical assistance;HOB elevated;Total assist Sit to supine: Total assist;+2 for safety/equipment;HOB elevated   General bed mobility comments: Pt was able to tolerate progressing from long sitting to short sit  with +2 assistance. pt does have active movements in all extremities however very slow to initiate and requires incraesed tactile and verbal cues. Toatl assist to achieve EOB sitting with constant +1 mod assist to prevent LOB. Pt has BLEs supported on floor and BUE support but poor righting/ reflexes   Transfers                 General transfer comment: unsafe to trial. currently using hoyer lift for safe transfers.  Ambulation/Gait                 Stairs             Wheelchair Mobility    Modified Rankin (Stroke Patients Only)       Balance                                            Cognition Arousal/Alertness: Awake/alert Behavior During Therapy: Flat affect Overall Cognitive Status: Difficult to assess                                 General Comments: Pt is not very verbal but does have eyes open and does follow simple commands consistently with increased time      Exercises      General Comments        Pertinent Vitals/Pain      Home Living  Prior Function            PT Goals (current goals can now be found in the care plan section) Acute Rehab PT Goals Patient Stated Goal: none states Progress towards PT goals: Progressing toward goals    Frequency    Min 2X/week      PT Plan Current plan remains appropriate    Co-evaluation              AM-PAC PT "6 Clicks" Mobility   Outcome Measure  Help needed turning from your back to your side while in a flat bed without using bedrails?: Total Help needed moving from lying on your back to sitting on the side of a flat bed without using bedrails?: Total Help needed moving to and from a bed to a chair (including a wheelchair)?: Total Help needed standing up from a chair using your arms (e.g., wheelchair or bedside chair)?: Total Help needed to walk in hospital room?: Total Help needed climbing 3-5 steps with a  railing? : Total 6 Click Score: 6    End of Session Equipment Utilized During Treatment: Gait belt Activity Tolerance: Patient tolerated treatment well Patient left: in bed;with call bell/phone within reach;with bed alarm set;with SCD's reapplied Nurse Communication: Mobility status PT Visit Diagnosis: Muscle weakness (generalized) (M62.81);Difficulty in walking, not elsewhere classified (R26.2);Pain     Time: 6553-7482 PT Time Calculation (min) (ACUTE ONLY): 10 min  Charges:  $Therapeutic Activity: 8-22 mins                     Taylor Tyler PTA 12/28/19, 1:15 PM

## 2019-12-28 NOTE — Progress Notes (Addendum)
End of Shift report:  Rectal tube placed without issue. Pressure sores assessed and dressings changed  after placement of rectal tube. Covid swab taken and sent to lab. Patient has been verbal today when she desires. Currently resting and watching tv. Generalized edema throughout . Dialysis scheduled for tomorrow before d/c. Tube feeds running at goal and tolerated well this shift.

## 2019-12-28 NOTE — Progress Notes (Signed)
Progress Note    Taylor Tyler  CLE:[id number] DOB: August 01, 1941  DOA: 12/11/2019 PCP: McLean-Scocuzza, Nino Glow, MD      Brief Narrative:    Medical records reviewed and are as summarized below:  Taylor Tyler is a 79 y.o. female with a history ofend-stage renal disease, hypertension, diabetes, history of lumbar fusion, laminectomy 2014, with revision in 9449 complicated by wound infectiondue to Proteus  She was admitted from skilled nursing facility facility with fever and altered mental status of 1 day duration. In the ED temperature was 101.2, BP 101/44, heart rate of 79 and respiratory rate of 34. As blood pressure dropped further she was admitted to the ICU and started on pressors.        Assessment/Plan:   Active Problems:   Anemia of chronic disease   Hypokalemia   Encephalopathy acute   Sacral decubitus ulcer, stage IV (HCC)   Severe sepsis with septic shock (HCC)   Osteomyelitis (HCC)   Goals of care, counseling/discussion   Palliative care by specialist   DNR (do not resuscitate) discussion   Failure to thrive in adult   S/p septic shock/stage IV sacral decubitus wound infection with abscess and underlying osteomyelitis/Proteus and Citrobacter bacteremia -s/p debridement of necrotic sacral wound. - S/p CT-guided aspiration of 20 mls posterior paraspinal fluid collection.  -recieved IV meropenem while in the hospital and patient now on oral Bactrim indefinitely-appreciate ID recommendations. -5/24>> repeat BC pending  Acute toxic/metabolic encephalopathy: -Patient's mentation improving-- saying few things--taking longer to respond -Continue supportive care  Dysphagia -status post peg tube placement by Dr. Alice Reichert 12/24/2019 Continue enteral nutrition via PEG tube.  Follow-up with dietitian for further recommendations.  Hypokalemia and hypophosphatemia--repeleted.   ESRD: Plan for hemodialysis tomorrow after her discharge considered.  Case  was discussed with Dr. Candiss Norse, nephrologist.    Anemia of chronic disease: Continue epoetin. Monitor H&H. S/p transfusion with one unit on  PRBCs 5/22.  Blood work was ordered today but unfortunately this cannot be obtained by phlebotomy team because of significant peripheral edema.  Plan to obtain blood work at dialysis tomorrow.  Case was discussed with nephrologist.  Hypothyroidism:  -synthroid  Dementia Continue aricept, risperdal and nameda  Unstageable sacral decubitus ulcer and left heel deep tissue injury (present on admission): Continue local wound care per wound nurse's recommendation. Turn patient every 2 hours.  Overall poor prognosis long term, slow improvement Palliative care input appreciated--pt is FULL code, will need to follow with Palliative care   Body mass index is 32.99 kg/m.  (Obesity)   Family Communication/Anticipated D/C date and plan/Code Status   DVT prophylaxis: Heparin Code Status: Full code Family Communication: None Disposition Plan:    Status is: Inpatient  Remains inpatient appropriate because:Unsafe d/c plan and Inpatient level of care appropriate due to severity of illness   Dispo: The patient is from: SNF              Anticipated d/c is to: SNF              Anticipated d/c date is: 1 day              Patient currently is not medically stable to d/c.            Subjective:   She is unable to provide an adequate history because of confusion.  She responded "fine" when she was asked how she was doing.  That was all she could say.  Objective:  Vitals:   12/27/19 1720 12/27/19 2320 12/28/19 0432 12/28/19 0753  BP: 115/90 (!) 133/47  (!) 121/32  Pulse: 97 91  96  Resp: 16 16  18   Temp: (!) 97.4 F (36.3 C) 97.8 F (36.6 C)  98.2 F (36.8 C)  TempSrc:    Oral  SpO2: 97% 100%  100%  Weight:   92.7 kg   Height:       No data found.   Intake/Output Summary (Last 24 hours) at 12/28/2019 1455 Last data filed at  12/27/2019 1620 Gross per 24 hour  Intake --  Output 516 ml  Net -516 ml   Filed Weights   12/25/19 0500 12/27/19 1241 12/28/19 0432  Weight: 89.4 kg 89.4 kg 92.7 kg    Exam:  GEN: NAD SKIN: Stage IV sacral decubitus ulcer, deep tissue injury of the left heel (present on admission) EYES: EOMI ENT: MMM CV: RRR PULM: CTA B ABD: soft, ND, NT, +BS CNS: Alert.  Patient unable to participate in full neurological exam. EXT: Edema of bilateral upper and lower extremities.  No tenderness.   Data Reviewed:   I have personally reviewed following labs and imaging studies:  Labs: Labs show the following:   Basic Metabolic Panel: Recent Labs  Lab 12/22/19 0741 12/22/19 0741 12/23/19 1319 12/23/19 1319 12/24/19 0424 12/25/19 0514  NA 137  --  139  --  140 140  K 2.7*   < > 2.9*   < > 2.9* 4.1  CL 102  --  101  --  103 103  CO2 27  --  29  --  29 28  GLUCOSE 190*  --  204*  --  144* 85  BUN 36*  --  35*  --  44* 27*  CREATININE 2.27*  --  1.83*  --  1.97* 1.52*  CALCIUM 7.8*  --  7.9*  --  7.9* 8.0*  MG  --   --  1.5*  --  1.9 1.8  PHOS <1.0*  --  1.6*  --  2.8 3.1   < > = values in this interval not displayed.   GFR Estimated Creatinine Clearance: 36.1 mL/min (A) (by C-G formula based on SCr of 1.52 mg/dL (H)). Liver Function Tests: Recent Labs  Lab 12/22/19 0741  ALBUMIN 1.6*   No results for input(s): LIPASE, AMYLASE in the last 168 hours. No results for input(s): AMMONIA in the last 168 hours. Coagulation profile No results for input(s): INR, PROTIME in the last 168 hours.  CBC: Recent Labs  Lab 12/22/19 0741 12/23/19 1445 12/24/19 1745  WBC 11.9*  --   --   HGB 6.8* 6.8* 10.1*  HCT 24.5*  --  34.4*  MCV 96.1  --   --   PLT 196  --   --    Cardiac Enzymes: No results for input(s): CKTOTAL, CKMB, CKMBINDEX, TROPONINI in the last 168 hours. BNP (last 3 results) No results for input(s): PROBNP in the last 8760 hours. CBG: Recent Labs  Lab  12/27/19 1152 12/27/19 1721 12/27/19 2058 12/28/19 0754 12/28/19 1144  GLUCAP 148* 139* 120* 117* 124*   D-Dimer: No results for input(s): DDIMER in the last 72 hours. Hgb A1c: No results for input(s): HGBA1C in the last 72 hours. Lipid Profile: No results for input(s): CHOL, HDL, LDLCALC, TRIG, CHOLHDL, LDLDIRECT in the last 72 hours. Thyroid function studies: No results for input(s): TSH, T4TOTAL, T3FREE, THYROIDAB in the last 72 hours.  Invalid input(s): FREET3 Anemia  work up: No results for input(s): VITAMINB12, FOLATE, FERRITIN, TIBC, IRON, RETICCTPCT in the last 72 hours. Sepsis Labs: Recent Labs  Lab 12/22/19 0741  WBC 11.9*    Microbiology Recent Results (from the past 240 hour(s))  Aerobic/Anaerobic Culture (surgical/deep wound)     Status: None   Collection Time: 12/19/19  3:08 PM   Specimen: Abscess  Result Value Ref Range Status   Specimen Description   Final    ABSCESS Performed at [facility], [address] [place], [place] [address]    Special Requests   Final    NONE Performed at [facility], [address] [place], [place] [address]    Gram Stain   Final    ABUNDANT WBC PRESENT, PREDOMINANTLY PMN RARE GRAM POSITIVE RODS RARE GRAM NEGATIVE RODS    Culture   Final    FEW ESCHERICHIA COLI MODERATE PROTEUS MIRABILIS NO ANAEROBES ISOLATED Performed at [facility], [place] [address], [place], [place] [address]    Report Status 12/24/2019 FINAL  Final   Organism ID, Bacteria ESCHERICHIA COLI  Final   Organism ID, Bacteria PROTEUS MIRABILIS  Final      Susceptibility   Escherichia coli - MIC*    AMPICILLIN >=32 RESISTANT Resistant     CEFAZOLIN >=64 RESISTANT Resistant     CEFEPIME <=1 SENSITIVE Sensitive     CEFTAZIDIME >=64 RESISTANT Resistant     CEFTRIAXONE >=64 RESISTANT Resistant     CIPROFLOXACIN <=0.25 SENSITIVE Sensitive     GENTAMICIN <=1 SENSITIVE Sensitive     IMIPENEM 1 SENSITIVE Sensitive      TRIMETH/SULFA <=20 SENSITIVE Sensitive     AMPICILLIN/SULBACTAM >=32 RESISTANT Resistant     PIP/TAZO 64 INTERMEDIATE Intermediate     * FEW ESCHERICHIA COLI   Proteus mirabilis - MIC*    AMPICILLIN >=32 RESISTANT Resistant     CEFAZOLIN >=64 RESISTANT Resistant     CEFEPIME RESISTANT Resistant     CEFTAZIDIME >=64 RESISTANT Resistant     CEFTRIAXONE >=64 RESISTANT Resistant     CIPROFLOXACIN <=0.25 SENSITIVE Sensitive     GENTAMICIN <=1 SENSITIVE Sensitive     IMIPENEM 4 SENSITIVE Sensitive     TRIMETH/SULFA <=20 SENSITIVE Sensitive     AMPICILLIN/SULBACTAM <=2 SENSITIVE Sensitive     PIP/TAZO <=4 SENSITIVE Sensitive     * MODERATE PROTEUS MIRABILIS  Culture, blood (Routine X 2) w Reflex to ID Panel     Status: None (Preliminary result)   Collection Time: 12/26/19  1:25 PM   Specimen: BLOOD  Result Value Ref Range Status   Specimen Description BLOOD BLOOD RIGHT WRIST  Final   Special Requests   Final    BOTTLES DRAWN AEROBIC ONLY Blood Culture results may not be optimal due to an inadequate volume of blood received in culture bottles   Culture   Final    NO GROWTH 2 DAYS Performed at [facility], [address], [place], [place] [address]    Report Status PENDING  Incomplete  Culture, blood (Routine X 2) w Reflex to ID Panel     Status: None (Preliminary result)   Collection Time: 12/26/19  3:48 PM   Specimen: BLOOD  Result Value Ref Range Status   Specimen Description BLOOD [facility]  Final   Special Requests   Final    BOTTLES DRAWN AEROBIC AND ANAEROBIC Blood Culture results may not be optimal due to an inadequate volume of blood received in culture bottles   Culture  Final    NO GROWTH 2 DAYS Performed at [facility], [place], [place] [address]    Report Status PENDING  Incomplete    Procedures and diagnostic studies:  No results found.  Medications:   . atorvastatin  40 mg Oral q1800  . chlorhexidine  15 mL Mouth Rinse BID  .  Chlorhexidine Gluconate Cloth  6 each Topical Q0600  . citalopram  10 mg Per Tube Daily  . donepezil  5 mg Per Tube QHS  . epoetin (EPOGEN/PROCRIT) injection  10,000 Units Intravenous Q T,Th,Sa-HD  . feeding supplement (PRO-STAT SUGAR FREE 64)  30 mL Per Tube BID  . folic acid  856 mcg Per Tube Daily  . heparin injection (subcutaneous)  5,000 Units Subcutaneous Q8H  . insulin aspart  0-5 Units Subcutaneous QHS  . insulin aspart  0-9 Units Subcutaneous TID WC  . levothyroxine  100 mcg Per Tube Q0600  . mouth rinse  15 mL Mouth Rinse q12n4p  . memantine  5 mg Per Tube BID  . midodrine  5 mg Per Tube TID WC  . pantoprazole sodium  40 mg Per Tube Daily  . phosphorus  500 mg Per Tube BID  . risperiDONE  0.25 mg Per Tube BID  . sevelamer carbonate  800 mg Oral TID WC  . sodium chloride flush  10-40 mL Intracatheter Q12H  . sulfamethoxazole-trimethoprim  1 tablet Per Tube Q12H   Continuous Infusions: . sodium chloride Stopped (12/25/19 1756)  . albumin human Stopped (12/22/19 1346)  . feeding supplement (JEVITY 1.5 CAL/FIBER) 1,000 mL (12/27/19 1723)     LOS: 17 days   Kristine Chahal  [facility]     12/28/2019, 2:55 PM

## 2019-12-28 NOTE — Plan of Care (Signed)
  Problem: Nutrition: Goal: Adequate nutrition will be maintained Outcome: Progressing Note: Cont. feeding via peg tube   Problem: Health Behavior/Discharge Planning: Goal: Ability to manage health-related needs will improve Outcome: Not Progressing   Problem: Activity: Goal: Risk for activity intolerance will decrease Outcome: Not Progressing

## 2019-12-28 NOTE — Progress Notes (Signed)
[facility]  ROUNDING NOTE   Subjective:   Feels fair. No acute events reported by nursing Continues to have large amount of edema. Labs could not be drawn More interactive today Tube feeds continued via PEG tube @ 50 cc/hr  Objective:  Vital signs in last 24 hours:  Temp:  [97.4 F (36.3 C)-98.4 F (36.9 C)] 98.2 F (36.8 C) (05/26 0753) Pulse Rate:  [91-134] 96 (05/26 0753) Resp:  [10-19] 18 (05/26 0753) BP: (90-157)/(32-96) 121/32 (05/26 0753) SpO2:  [97 %-100 %] 100 % (05/26 0753) Weight:  [89.4 kg-92.7 kg] 92.7 kg (05/26 0432)  Weight change:  Filed Weights   12/25/19 0500 12/27/19 1241 12/28/19 0432  Weight: 89.4 kg 89.4 kg 92.7 kg    Intake/Output: I/O last 3 completed shifts: In: 20 [I.V.:20] Out: 516 [Other:516]   Intake/Output this shift:  No intake/output data recorded.   Physical Exam: General:  No acute distress, laying in the bed, chronically ill-appearing  HEENT  anicteric, moist oral mucous membrane  Pulm/lungs  normal breathing effort, lungs are clear to auscultation  CVS/Heart  no rub or gallop  Abdomen:   Soft, nontender, PEG tube in place  Extremities:  Dependent edema is present  Neurologic:  able to follow simple commands   Skin:  No acute rashes  Right IJ PermCath   Basic Metabolic Panel: Recent Labs  Lab 12/22/19 0741 12/22/19 0741 12/23/19 1319 12/24/19 0424 12/25/19 0514  NA 137  --  139 140 140  K 2.7*  --  2.9* 2.9* 4.1  CL 102  --  101 103 103  CO2 27  --  29 29 28   GLUCOSE 190*  --  204* 144* 85  BUN 36*  --  35* 44* 27*  CREATININE 2.27*  --  1.83* 1.97* 1.52*  CALCIUM 7.8*   < > 7.9* 7.9* 8.0*  MG  --   --  1.5* 1.9 1.8  PHOS <1.0*  --  1.6* 2.8 3.1   < > = values in this interval not displayed.    Liver Function Tests: Recent Labs  Lab 12/22/19 0741  ALBUMIN 1.6*   No results for input(s): LIPASE, AMYLASE in the last 168 hours. No results for input(s): AMMONIA in the last 168  hours.  CBC: Recent Labs  Lab 12/22/19 0741 12/23/19 1445 12/24/19 1745  WBC 11.9*  --   --   HGB 6.8* 6.8* 10.1*  HCT 24.5*  --  34.4*  MCV 96.1  --   --   PLT 196  --   --     Cardiac Enzymes: No results for input(s): CKTOTAL, CKMB, CKMBINDEX, TROPONINI in the last 168 hours.  BNP: Invalid input(s): POCBNP  CBG: Recent Labs  Lab 12/27/19 0730 12/27/19 1152 12/27/19 1721 12/27/19 2058 12/28/19 0754  GLUCAP 132* 148* 139* 120* 117*    Microbiology: Results for orders placed or performed during the hospital encounter of 12/11/19  Blood Culture (routine x 2)     Status: Abnormal   Collection Time: 12/11/19  7:45 PM   Specimen: BLOOD  Result Value Ref Range Status   Specimen Description   Final    BLOOD RIGHT FA Performed at [facility], [address] [place], [place] [address]    Special Requests   Final    BOTTLES DRAWN AEROBIC AND ANAEROBIC Blood Culture adequate volume Performed at [facility], [address], [place], [place] [address]    Culture  Setup Time   Final  GRAM NEGATIVE RODS IN BOTH AEROBIC AND ANAEROBIC BOTTLES CRITICAL RESULT CALLED TO, READ BACK BY AND VERIFIED WITH: Rayna Sexton AT 8413 ON 12/12/19 SNG    Culture (A)  Final    PROTEUS MIRABILIS CITROBACTER YOUNGAE SUSCEPTIBILITIES PERFORMED ON PREVIOUS CULTURE WITHIN THE LAST 5 DAYS. Performed at [facility], [place] [address], [place], [place] [address]    Report Status 12/16/2019 FINAL  Final  Blood Culture (routine x 2)     Status: Abnormal   Collection Time: 12/11/19  7:45 PM   Specimen: BLOOD  Result Value Ref Range Status   Specimen Description   Final    BLOOD RIGHT ARM Performed at [facility], [address], [place], [place] [address]    Special Requests   Final    BOTTLES DRAWN AEROBIC AND ANAEROBIC Blood Culture adequate volume Performed at [facility], [address], [place], [place] [address]    Culture  Setup Time    Final    GRAM NEGATIVE RODS IN BOTH AEROBIC AND ANAEROBIC BOTTLES CRITICAL VALUE NOTED.  VALUE IS CONSISTENT WITH PREVIOUSLY REPORTED AND CALLED VALUE. Performed at [facility], [address] [place], [place] [address]    Culture PROTEUS MIRABILIS Su Hoff  (A)  Final   Report Status 12/16/2019 FINAL  Final   Organism ID, Bacteria PROTEUS MIRABILIS  Final   Organism ID, Bacteria CITROBACTER YOUNGAE  Final      Susceptibility   Citrobacter youngae - MIC*    CEFAZOLIN >=64 RESISTANT Resistant     CEFEPIME <=1 SENSITIVE Sensitive     CEFTAZIDIME >=64 RESISTANT Resistant     CEFTRIAXONE >=64 RESISTANT Resistant     CIPROFLOXACIN <=0.25 SENSITIVE Sensitive     GENTAMICIN <=1 SENSITIVE Sensitive     IMIPENEM 1 SENSITIVE Sensitive     TRIMETH/SULFA <=20 SENSITIVE Sensitive     PIP/TAZO 64 INTERMEDIATE Intermediate     * CITROBACTER YOUNGAE   Proteus mirabilis - MIC*    AMPICILLIN <=2 SENSITIVE Sensitive     CEFAZOLIN <=4 SENSITIVE Sensitive     CEFEPIME <=1 SENSITIVE Sensitive     CEFTAZIDIME <=1 SENSITIVE Sensitive     CEFTRIAXONE <=1 SENSITIVE Sensitive     CIPROFLOXACIN <=0.25 SENSITIVE Sensitive     GENTAMICIN <=1 SENSITIVE Sensitive     IMIPENEM 4 SENSITIVE Sensitive     TRIMETH/SULFA <=20 SENSITIVE Sensitive     AMPICILLIN/SULBACTAM <=2 SENSITIVE Sensitive     PIP/TAZO <=4 SENSITIVE Sensitive     * PROTEUS MIRABILIS  Blood Culture ID Panel (Reflexed)     Status: Abnormal   Collection Time: 12/11/19  7:45 PM  Result Value Ref Range Status   Enterococcus species NOT DETECTED NOT DETECTED Final   Listeria monocytogenes NOT DETECTED NOT DETECTED Final   Staphylococcus species NOT DETECTED NOT DETECTED Final   Staphylococcus aureus (BCID) NOT DETECTED NOT DETECTED Final   Streptococcus species NOT DETECTED NOT DETECTED Final   Streptococcus agalactiae NOT DETECTED NOT DETECTED Final   Streptococcus pneumoniae NOT DETECTED NOT DETECTED Final    Streptococcus pyogenes NOT DETECTED NOT DETECTED Final   Acinetobacter baumannii NOT DETECTED NOT DETECTED Final   Enterobacteriaceae species DETECTED (A) NOT DETECTED Final    Comment: Enterobacteriaceae represent a large family of gram-negative bacteria, not a single organism. CRITICAL RESULT CALLED TO, READ BACK BY AND VERIFIED WITH: Rayna Sexton AT 2595 ON 12/12/19 SNG    Enterobacter cloacae complex NOT DETECTED NOT DETECTED Final   Escherichia  coli NOT DETECTED NOT DETECTED Final   Klebsiella oxytoca NOT DETECTED NOT DETECTED Final   Klebsiella pneumoniae NOT DETECTED NOT DETECTED Final   Proteus species DETECTED (A) NOT DETECTED Final    Comment: CRITICAL RESULT CALLED TO, READ BACK BY AND VERIFIED WITH: Rayna Sexton AT 1540 ON 12/12/19 SNG    Serratia marcescens NOT DETECTED NOT DETECTED Final   Carbapenem resistance NOT DETECTED NOT DETECTED Final   Haemophilus influenzae NOT DETECTED NOT DETECTED Final   Neisseria meningitidis NOT DETECTED NOT DETECTED Final   Pseudomonas aeruginosa NOT DETECTED NOT DETECTED Final   Candida albicans NOT DETECTED NOT DETECTED Final   Candida glabrata NOT DETECTED NOT DETECTED Final   Candida krusei NOT DETECTED NOT DETECTED Final   Candida parapsilosis NOT DETECTED NOT DETECTED Final   Candida tropicalis NOT DETECTED NOT DETECTED Final    Comment: Performed at [facility], [address] [place], [place] [address]  Urine culture     Status: Abnormal   Collection Time: 12/11/19  8:09 PM   Specimen: In/Out Cath Urine  Result Value Ref Range Status   Specimen Description   Final    IN/OUT CATH URINE Performed at [facility], [address] [place], [place] [address]    Special Requests   Final    NONE Performed at [facility], [address] [place], [place] [address]    Culture MULTIPLE SPECIES PRESENT, SUGGEST RECOLLECTION (A)  Final   Report Status 12/12/2019 FINAL  Final  Respiratory Panel by RT PCR  (Flu A&B, Covid) - Nasopharyngeal Swab     Status: None   Collection Time: 12/11/19  9:17 PM   Specimen: Nasopharyngeal Swab  Result Value Ref Range Status   SARS Coronavirus 2 by RT PCR NEGATIVE NEGATIVE Final    Comment: (NOTE) SARS-CoV-2 target nucleic acids are NOT DETECTED. The SARS-CoV-2 RNA is generally detectable in upper respiratoy specimens during the acute phase of infection. The lowest concentration of SARS-CoV-2 viral copies this assay can detect is 131 copies/mL. A negative result does not preclude SARS-Cov-2 infection and should not be used as the sole basis for treatment or other patient management decisions. A negative result may occur with  improper specimen collection/handling, submission of specimen other than nasopharyngeal swab, presence of viral mutation(s) within the areas targeted by this assay, and inadequate number of viral copies (<131 copies/mL). A negative result must be combined with clinical observations, patient history, and epidemiological information. The expected result is Negative. Fact Sheet for Patients:  PinkCheek.be Fact Sheet for Healthcare Providers:  GravelBags.it This test is not yet ap proved or cleared by the [place] FDA and  has been authorized for detection and/or diagnosis of SARS-CoV-2 by FDA under an Emergency Use Authorization (EUA). This EUA will remain  in effect (meaning this test can be used) for the duration of the COVID-19 declaration under Section 564(b)(1) of the Act, 21 U.S.C. section 360bbb-3(b)(1), unless the authorization is terminated or revoked sooner.    Influenza A by PCR NEGATIVE NEGATIVE Final   Influenza B by PCR NEGATIVE NEGATIVE Final    Comment: (NOTE) The Xpert Xpress SARS-CoV-2/FLU/RSV assay is intended as an aid in  the diagnosis of influenza from Nasopharyngeal swab specimens and  should not be used as a sole basis for treatment. Nasal  washings and  aspirates are unacceptable for Xpert Xpress SARS-CoV-2/FLU/RSV  testing. Fact Sheet for Patients: PinkCheek.be Fact Sheet for Healthcare Providers: GravelBags.it This test is not yet approved  or cleared by the [place] and  has been authorized for detection and/or diagnosis of SARS-CoV-2 by  FDA under an Emergency Use Authorization (EUA). This EUA will remain  in effect (meaning this test can be used) for the duration of the  Covid-19 declaration under Section 564(b)(1) of the Act, 21  U.S.C. section 360bbb-3(b)(1), unless the authorization is  terminated or revoked. Performed at [facility], [address] [place], [place] [address]   MRSA PCR Screening     Status: None   Collection Time: 12/12/19 12:29 AM   Specimen: Nasopharyngeal  Result Value Ref Range Status   MRSA by PCR NEGATIVE NEGATIVE Final    Comment:        The GeneXpert MRSA Assay (FDA approved for NASAL specimens only), is one component of a comprehensive MRSA colonization surveillance program. It is not intended to diagnose MRSA infection nor to guide or monitor treatment for MRSA infections. Performed at [facility], [address], [place], [place] [address]   Aerobic/Anaerobic Culture (surgical/deep wound)     Status: None   Collection Time: 12/12/19  2:06 AM   Specimen: Sacral; Wound  Result Value Ref Range Status   Specimen Description   Final    SACRAL Performed at [facility], [address] [place], [place] [address]    Special Requests   Final    NONE Performed at [facility], [place], [place] [address]    Gram Stain NO WBC SEEN NO ORGANISMS SEEN   Final   Culture   Final    FEW ESCHERICHIA COLI FEW CITROBACTER SPECIES FEW PROTEUS MIRABILIS FEW BACTEROIDES OVATUS BETA LACTAMASE POSITIVE Performed at [facility], [place] [address]  [place], [place] [address]    Report Status 12/15/2019 FINAL  Final   Organism ID, Bacteria ESCHERICHIA COLI  Final   Organism ID, Bacteria CITROBACTER SPECIES  Final   Organism ID, Bacteria PROTEUS MIRABILIS  Final      Susceptibility   Citrobacter species - MIC*    CEFAZOLIN >=64 RESISTANT Resistant     CEFEPIME <=1 SENSITIVE Sensitive     CEFTAZIDIME >=64 RESISTANT Resistant     CEFTRIAXONE >=64 RESISTANT Resistant     CIPROFLOXACIN <=0.25 SENSITIVE Sensitive     GENTAMICIN <=1 SENSITIVE Sensitive     IMIPENEM <=0.25 SENSITIVE Sensitive     TRIMETH/SULFA <=20 SENSITIVE Sensitive     PIP/TAZO 64 INTERMEDIATE Intermediate     * FEW CITROBACTER SPECIES   Escherichia coli - MIC*    AMPICILLIN 16 INTERMEDIATE Intermediate     CEFAZOLIN >=64 RESISTANT Resistant     CEFEPIME <=1 SENSITIVE Sensitive     CEFTAZIDIME <=1 SENSITIVE Sensitive     CEFTRIAXONE <=1 SENSITIVE Sensitive     CIPROFLOXACIN <=0.25 SENSITIVE Sensitive     GENTAMICIN <=1 SENSITIVE Sensitive     IMIPENEM <=0.25 SENSITIVE Sensitive     TRIMETH/SULFA <=20 SENSITIVE Sensitive     AMPICILLIN/SULBACTAM 4 SENSITIVE Sensitive     PIP/TAZO <=4 SENSITIVE Sensitive     * FEW ESCHERICHIA COLI   Proteus mirabilis - MIC*    AMPICILLIN <=2 SENSITIVE Sensitive     CEFAZOLIN <=4 SENSITIVE Sensitive     CEFEPIME <=1 SENSITIVE Sensitive     CEFTAZIDIME <=1 SENSITIVE Sensitive     CEFTRIAXONE <=1 SENSITIVE Sensitive     CIPROFLOXACIN <=0.25 SENSITIVE Sensitive     GENTAMICIN 2 SENSITIVE Sensitive     IMIPENEM 4  SENSITIVE Sensitive     TRIMETH/SULFA <=20 SENSITIVE Sensitive     AMPICILLIN/SULBACTAM <=2 SENSITIVE Sensitive     PIP/TAZO <=4 SENSITIVE Sensitive     * FEW PROTEUS MIRABILIS  Aerobic/Anaerobic Culture (surgical/deep wound)     Status: None   Collection Time: 12/12/19  2:06 AM   Specimen: Back; Wound  Result Value Ref Range Status   Specimen Description   Final    BACK LUMBAR SPINE Performed at [facility],  [address] [place], [place] [address]    Special Requests   Final    NONE Performed at [facility], [place], [place] [address]    Gram Stain NO WBC SEEN NO ORGANISMS SEEN   Final   Culture   Final    FEW PROTEUS MIRABILIS NO ANAEROBES ISOLATED Performed at [facility], [place] [address], [place], [place] [address]    Report Status 12/17/2019 FINAL  Final   Organism ID, Bacteria PROTEUS MIRABILIS  Final      Susceptibility   Proteus mirabilis - MIC*    AMPICILLIN <=2 SENSITIVE Sensitive     CEFAZOLIN <=4 SENSITIVE Sensitive     CEFEPIME <=1 SENSITIVE Sensitive     CEFTAZIDIME <=1 SENSITIVE Sensitive     CEFTRIAXONE <=1 SENSITIVE Sensitive     CIPROFLOXACIN <=0.25 SENSITIVE Sensitive     GENTAMICIN 2 SENSITIVE Sensitive     IMIPENEM 4 SENSITIVE Sensitive     TRIMETH/SULFA <=20 SENSITIVE Sensitive     AMPICILLIN/SULBACTAM <=2 SENSITIVE Sensitive     PIP/TAZO <=4 SENSITIVE Sensitive     * FEW PROTEUS MIRABILIS  Aerobic/Anaerobic Culture (surgical/deep wound)     Status: None   Collection Time: 12/19/19  3:08 PM   Specimen: Abscess  Result Value Ref Range Status   Specimen Description   Final    ABSCESS Performed at [facility], [address] [place], [place] [address]    Special Requests   Final    NONE Performed at [facility], [address] [place], [place] [address]    Gram Stain   Final    ABUNDANT WBC PRESENT, PREDOMINANTLY PMN RARE GRAM POSITIVE RODS RARE GRAM NEGATIVE RODS    Culture   Final    FEW ESCHERICHIA COLI MODERATE PROTEUS MIRABILIS NO ANAEROBES ISOLATED Performed at [facility], [place] [address] [place], [place] [address]    Report Status 12/24/2019 FINAL  Final   Organism ID, Bacteria ESCHERICHIA COLI  Final   Organism ID, Bacteria PROTEUS MIRABILIS  Final      Susceptibility   Escherichia coli - MIC*    AMPICILLIN >=32 RESISTANT Resistant     CEFAZOLIN >=64 RESISTANT  Resistant     CEFEPIME <=1 SENSITIVE Sensitive     CEFTAZIDIME >=64 RESISTANT Resistant     CEFTRIAXONE >=64 RESISTANT Resistant     CIPROFLOXACIN <=0.25 SENSITIVE Sensitive     GENTAMICIN <=1 SENSITIVE Sensitive     IMIPENEM 1 SENSITIVE Sensitive     TRIMETH/SULFA <=20 SENSITIVE Sensitive     AMPICILLIN/SULBACTAM >=32 RESISTANT Resistant     PIP/TAZO 64 INTERMEDIATE Intermediate     * FEW ESCHERICHIA COLI   Proteus mirabilis - MIC*    AMPICILLIN >=32 RESISTANT Resistant     CEFAZOLIN >=64 RESISTANT Resistant     CEFEPIME RESISTANT Resistant     CEFTAZIDIME >=64 RESISTANT Resistant     CEFTRIAXONE >=64 RESISTANT Resistant     CIPROFLOXACIN <=0.25 SENSITIVE Sensitive  GENTAMICIN <=1 SENSITIVE Sensitive     IMIPENEM 4 SENSITIVE Sensitive     TRIMETH/SULFA <=20 SENSITIVE Sensitive     AMPICILLIN/SULBACTAM <=2 SENSITIVE Sensitive     PIP/TAZO <=4 SENSITIVE Sensitive     * MODERATE PROTEUS MIRABILIS  Culture, blood (Routine X 2) w Reflex to ID Panel     Status: None (Preliminary result)   Collection Time: 12/26/19  1:25 PM   Specimen: BLOOD  Result Value Ref Range Status   Specimen Description BLOOD BLOOD RIGHT WRIST  Final   Special Requests   Final    BOTTLES DRAWN AEROBIC ONLY Blood Culture results may not be optimal due to an inadequate volume of blood received in culture bottles   Culture   Final    NO GROWTH 2 DAYS Performed at [facility], [address], [place], [place] [address]    Report Status PENDING  Incomplete  Culture, blood (Routine X 2) w Reflex to ID Panel     Status: None (Preliminary result)   Collection Time: 12/26/19  3:48 PM   Specimen: BLOOD  Result Value Ref Range Status   Specimen Description BLOOD [facility]  Final   Special Requests   Final    BOTTLES DRAWN AEROBIC AND ANAEROBIC Blood Culture results may not be optimal due to an inadequate volume of blood received in culture bottles   Culture   Final    NO GROWTH 2 DAYS Performed at [facility], [address] [place], [place] [address]    Report Status PENDING  Incomplete    Coagulation Studies: No results for input(s): LABPROT, INR in the last 72 hours.  Urinalysis: No results for input(s): COLORURINE, LABSPEC, PHURINE, GLUCOSEU, HGBUR, BILIRUBINUR, KETONESUR, PROTEINUR, UROBILINOGEN, NITRITE, LEUKOCYTESUR in the last 72 hours.  Invalid input(s): APPERANCEUR    Imaging: No results found.   Medications:   . sodium chloride Stopped (12/25/19 1756)  . albumin human Stopped (12/22/19 1346)  . feeding supplement (JEVITY 1.5 CAL/FIBER) 1,000 mL (12/27/19 1723)   . atorvastatin  40 mg Oral q1800  . chlorhexidine  15 mL Mouth Rinse BID  . Chlorhexidine Gluconate Cloth  6 each Topical Q0600  . citalopram  10 mg Per Tube Daily  . donepezil  5 mg Per Tube QHS  . epoetin (EPOGEN/PROCRIT) injection  10,000 Units Intravenous Q T,Th,Sa-HD  . feeding supplement (PRO-STAT SUGAR FREE 64)  30 mL Per Tube BID  . folic acid  833 mcg Per Tube Daily  . heparin injection (subcutaneous)  5,000 Units Subcutaneous Q8H  . insulin aspart  0-5 Units Subcutaneous QHS  . insulin aspart  0-9 Units Subcutaneous TID WC  . levothyroxine  100 mcg Per Tube Q0600  . mouth rinse  15 mL Mouth Rinse q12n4p  . memantine  5 mg Per Tube BID  . midodrine  5 mg Per Tube TID WC  . pantoprazole sodium  40 mg Per Tube Daily  . phosphorus  500 mg Per Tube BID  . risperiDONE  0.25 mg Per Tube BID  . sevelamer carbonate  800 mg Oral TID WC  . sodium chloride flush  10-40 mL Intracatheter Q12H  . sulfamethoxazole-trimethoprim  1 tablet Per Tube Q12H   acetaminophen, docusate sodium, HYDROcodone-acetaminophen, ipratropium-albuterol, ondansetron (ZOFRAN) IV, polyethylene glycol, sodium chloride flush  Assessment/ Plan:   [name] is a 79 y.o. black female with end stage renal disease on hemodialysis, dementia, diabetes mellitus type II, hypertension, hypothyroidism, osteomyelitis,  admitted to [facility]  on 12/11/2019 for Encephalopathy acute [G93.40] Urinary tract infection, acute [N39.0] Osteomyelitis, unspecified site, unspecified type ([place]) [M86.9]  [facility] [address] TTS RIJ 79kg.  #.  ESRD on HD TTS:   Next hemodialysis scheduled for Thursday with iv albumin supplementation Ultrafiltration goal 1.5 to 2 kg but only 0.5 kg removed Patient was able to sit for Dialysis in chair   #.  Anemia of chronic kidney disease.  Lab Results  Component Value Date   HGB 10.1 (L) 12/24/2019  - transfusion as per hospitalist.  - EPO with HD treatment.  - Appreciate GI input.   #  Secondary hyperparathyroidism: Lab Results  Component Value Date   PTH 68 (H) 12/13/2019   CALCIUM 8.0 (L) 12/25/2019   CAION 1.11 (L) 06/23/2019   PHOS 3.1 12/25/2019  Monitor calcium and phosphorus during this admission   #Sepsis with stage IV sacral decubitus wound infection with abscess and underlying osteomyelitis, Proteus and Citrobacter bacteremia Status post debridement of necrotic sacral wound Status post CT-guided aspiration  Overall prognosis is very poor despite aggressive measures. Appreciate palliative care input.    LOS: [place] 5/26/202110:11 AM

## 2019-12-28 NOTE — TOC Progression Note (Addendum)
Transition of Care [facility]) - Progression Note    Patient Details  Name: Taylor Tyler MRN: [id number] Date of Birth: [birth year]  [facility] CM/SW Contact  Kareli Hossain, Gardiner Rhyme, LCSW Phone Number: 12/28/2019, 12:03 PM  Clinical Narrative:   MD feels pt may be medically ready to transfer back to [facility] tomorrow after HD session. Have asked for new COVID test and have left a message with son-[name]. Have given Deborah-WOM a heads up for possibility for transfer back tomorrow.  2:30 pm Have informed WOM they will need a hoyer and pad for pt to go to HD.       Expected Discharge Plan and Services                                                 Social Determinants of Health (SDOH) Interventions    Readmission Risk Interventions No flowsheet data found.

## 2019-12-29 LAB — RENAL FUNCTION PANEL
Albumin: 1.8 g/dL — ABNORMAL LOW (ref 3.5–5.0)
Anion gap: 8 (ref 5–15)
BUN: 45 mg/dL — ABNORMAL HIGH (ref 8–23)
CO2: 31 mmol/L (ref 22–32)
Calcium: 7.8 mg/dL — ABNORMAL LOW (ref 8.9–10.3)
Chloride: 99 mmol/L (ref 98–111)
Creatinine, Ser: 1.93 mg/dL — ABNORMAL HIGH (ref 0.44–1.00)
GFR calc Af Amer: 29 mL/min — ABNORMAL LOW (ref >60)
GFR calc non Af Amer: 25 mL/min — ABNORMAL LOW (ref >60)
Glucose, Bld: 153 mg/dL — ABNORMAL HIGH (ref 70–99)
Phosphorus: 5.1 mg/dL — ABNORMAL HIGH (ref 2.5–4.6)
Potassium: 4.1 mmol/L (ref 3.5–5.1)
Sodium: 138 mmol/L (ref 135–145)

## 2019-12-29 LAB — CBC WITH DIFFERENTIAL/PLATELET
Abs Immature Granulocytes: 0.05 10*3/uL (ref 0.00–0.07)
Basophils Absolute: 0.1 10*3/uL (ref 0.0–0.1)
Basophils Relative: 1 %
Eosinophils Absolute: 0.1 10*3/uL (ref 0.0–0.5)
Eosinophils Relative: 1 %
HCT: 27.3 % — ABNORMAL LOW (ref 36.0–46.0)
Hemoglobin: 8.2 g/dL — ABNORMAL LOW (ref 12.0–15.0)
Immature Granulocytes: 1 %
Lymphocytes Relative: 28 %
Lymphs Abs: 2.3 10*3/uL (ref 0.7–4.0)
MCH: 29.6 pg (ref 26.0–34.0)
MCHC: 30 g/dL (ref 30.0–36.0)
MCV: 98.6 fL (ref 80.0–100.0)
Monocytes Absolute: 0.6 10*3/uL (ref 0.1–1.0)
Monocytes Relative: 7 %
Neutro Abs: 4.9 10*3/uL (ref 1.7–7.7)
Neutrophils Relative %: 62 %
Platelets: 166 10*3/uL (ref 150–400)
RBC: 2.77 MIL/uL — ABNORMAL LOW (ref 3.87–5.11)
RDW: 21.6 % — ABNORMAL HIGH (ref 11.5–15.5)
Smear Review: NORMAL
WBC: 7.9 10*3/uL (ref 4.0–10.5)
nRBC: 0 % (ref 0.0–0.2)

## 2019-12-29 LAB — GLUCOSE, CAPILLARY
Glucose-Capillary: 143 mg/dL — ABNORMAL HIGH (ref 70–99)
Glucose-Capillary: 119 mg/dL — ABNORMAL HIGH (ref 70–99)

## 2019-12-29 MED ORDER — B COMPLEX-C PO TABS
1.0000 | ORAL_TABLET | Freq: Every day | ORAL | Status: DC
  Administered 2019-12-29: 1
  Filled 2019-12-29 (×2): qty 1

## 2019-12-29 NOTE — TOC Progression Note (Signed)
Transition of Care [facility]) - Progression Note    Patient Details  Name: NILLIE BARTOLOTTA MRN: [id number] Date of Birth: 09/02/1940  Transition of Care [facility]) CM/SW Contact  Tayler Heiden, Gardiner Rhyme, LCSW Phone Number: 12/29/2019, 1:56 PM  Clinical Narrative: Spoke with Deborah-WOM they would like to get SNF from [facility] health due to pt;s new PEG tube. Have begun auth for this and not sure if can get approval today. Will work on getting pt transferred to [facility]. They have the jhoyer and pad for HD.          Expected Discharge Plan and Services                                                 Social Determinants of Health (SDOH) Interventions    Readmission Risk Interventions No flowsheet data found.

## 2019-12-29 NOTE — Progress Notes (Signed)
Nutrition Follow-up  DOCUMENTATION CODES:   Not applicable  INTERVENTION:  Continue Jevity 1.5 Cal at 50 mL/hr (1200 mL goal daily volume) + Pro-Stat 30 ml BID per tube. Provides 2000 kcal, 107 grams of protein, 912 mL H2O daily.  Provide B-complex with C QHS per tube.  NUTRITION DIAGNOSIS:   Increased nutrient needs related to wound healing, chronic illness(ESRD on HD) as evidenced by estimated needs.  Ongoing - addressing with TF regimen.  GOAL:   Patient will meet greater than or equal to 90% of their needs  Met with TF regimen.  MONITOR:   TF tolerance, Skin, Labs  REASON FOR ASSESSMENT:   Consult Enteral/tube feeding initiation and management  ASSESSMENT:   79 yo female admitted with sepsis, decreased responsiveness. PMH includes ESRD on HD, dementia, DM-2, HTN, hypothyroidism, osteomyelitis.   -Patient s/p placement of 20 Fr. EndoVive Safety G-tube on 5/22.  Tube feeds were resumed on 5/23 and advanced back to goal. Patient was out of room and in HD at time of RD assessment. Per RN patient continues to tolerate tube feed regimen well. Plan is for patient to discharge back to [facility] when she returns from HD. Discussed patient with RD from [facility].  Medications reviewed and include: Epogen 10000 units during HD, folic acid 861 mcg daily per tube, Novolog 0-9 units TID, Novolog 0-5 units QHS, levothyroxine, Protonix, K Phos Neutral 500 mg BID per tube, Renvela 800 mg TID, 25 grams albumin during HD.  Labs reviewed: CBG 110-143, BUN 45, Creatinine 1.93, Phosphorus 5.1.  Enteral Access: PEG  I/O: pt anuric  Weight trend: 92.7 kg on 5/26; +17.2 kg from 5/10; pt documented to have mild-moderate pitting edema  Diet Order:   Diet Order            Diet NPO time specified  Diet effective midnight             EDUCATION NEEDS:   Not appropriate for education at this time  Skin:  Skin Assessment: Skin Integrity Issues: Skin Integrity Issues::  DTI, Unstageable, Incisions DTI: L heel (3cm x 3cm) Unstageable: sacrum (9cm x 5cm x 1cm) Incisions: non healing surgical wound to back   Last BM:  12/29/2019 - type 7; rectal tube now in place  Height:   Ht Readings from Last 1 Encounters:  12/12/19 5' 6" (1.676 m)   Weight:   Wt Readings from Last 1 Encounters:  12/28/19 92.7 kg   Ideal Body Weight:  59.1 kg  BMI:  Body mass index is 32.99 kg/m.  Estimated Nutritional Needs:   Kcal:  6837-2902  Protein:  100-115 gm  Fluid:  UOP + 1 L  Jacklynn Barnacle, MS, RD, LDN Pager number available on Amion

## 2019-12-29 NOTE — Progress Notes (Signed)
[facility]  ROUNDING NOTE   Subjective:   Feels fair. No acute events reported by nursing Continues to have large amount of edema.  More interactive today Tube feeds continued via PEG tube      HEMODIALYSIS FLOWSHEET:  Blood Flow Rate (mL/min): 400 mL/min Arterial Pressure (mmHg): -150 mmHg Venous Pressure (mmHg): 160 mmHg Transmembrane Pressure (mmHg): 50 mmHg Ultrafiltration Rate (mL/min): 490 mL/min Dialysate Flow Rate (mL/min): 800 ml/min Conductivity: Machine : 14 Conductivity: Machine : 14 Dialysis Fluid Bolus: Normal Saline Bolus Amount (mL): 250 mL Dialysate Change: 4K    Objective:  Vital signs in last 24 hours:  Temp:  [98.1 F (36.7 C)-99.8 F (37.7 C)] 98.4 F (36.9 C) (05/27 0753) Pulse Rate:  [85-90] 87 (05/27 0756) Resp:  [15-18] 18 (05/27 0753) BP: (142-173)/(55-67) 165/55 (05/27 0756) SpO2:  [98 %-100 %] 100 % (05/27 0753)  Weight change:  Filed Weights   12/25/19 0500 12/27/19 1241 12/28/19 0432  Weight: 89.4 kg 89.4 kg 92.7 kg    Intake/Output: I/O last 3 completed shifts: In: 37 [Other:60; NG/GT:550] Out: 1 [Stool:1]   Intake/Output this shift:  No intake/output data recorded.   Physical Exam: General:  No acute distress, chronically ill-appearing  HEENT  anicteric, moist oral mucous membrane  Pulm/lungs  normal breathing effort, lungs are clear to auscultation  CVS/Heart  no rub or gallop  Abdomen:   Soft, nontender, PEG tube in place  Extremities:  Dependent edema is present  Neurologic:  able to follow simple commands   Skin:  No acute rashes  Right IJ PermCath   Basic Metabolic Panel: Recent Labs  Lab 12/23/19 1319 12/23/19 1319 12/24/19 0424 12/25/19 0514 12/29/19 0630  NA 139  --  140 140 138  K 2.9*  --  2.9* 4.1 4.1  CL 101  --  103 103 99  CO2 29  --  29 28 31   GLUCOSE 204*  --  144* 85 153*  BUN 35*  --  44* 27* 45*  CREATININE 1.83*  --  1.97* 1.52* 1.93*  CALCIUM 7.9*   < > 7.9* 8.0* 7.8*   MG 1.5*  --  1.9 1.8  --   PHOS 1.6*  --  2.8 3.1 5.1*   < > = values in this interval not displayed.    Liver Function Tests: Recent Labs  Lab 12/29/19 0630  ALBUMIN 1.8*   No results for input(s): LIPASE, AMYLASE in the last 168 hours. No results for input(s): AMMONIA in the last 168 hours.  CBC: Recent Labs  Lab 12/23/19 1445 12/24/19 1745 12/29/19 0630  WBC  --   --  7.9  NEUTROABS  --   --  4.9  HGB 6.8* 10.1* 8.2*  HCT  --  34.4* 27.3*  MCV  --   --  98.6  PLT  --   --  166    Cardiac Enzymes: No results for input(s): CKTOTAL, CKMB, CKMBINDEX, TROPONINI in the last 168 hours.  BNP: Invalid input(s): POCBNP  CBG: Recent Labs  Lab 12/28/19 0754 12/28/19 1144 12/28/19 1654 12/28/19 2058 12/29/19 0754  GLUCAP 117* 124* 120* 110* 143*    Microbiology: Results for orders placed or performed during the hospital encounter of 12/11/19  Blood Culture (routine x 2)     Status: Abnormal   Collection Time: 12/11/19  7:45 PM   Specimen: BLOOD  Result Value Ref Range Status   Specimen Description   Final    BLOOD RIGHT FA Performed  at [facility], [address] [place], [place] [address]    Special Requests   Final    BOTTLES DRAWN AEROBIC AND ANAEROBIC Blood Culture adequate volume Performed at [facility], [address] [place], [place] [address]    Culture  Setup Time   Final    GRAM NEGATIVE RODS IN BOTH AEROBIC AND ANAEROBIC BOTTLES CRITICAL RESULT CALLED TO, READ BACK BY AND VERIFIED WITH: Taylor Tyler AT 0037 ON 12/12/19 SNG    Culture (A)  Final    PROTEUS MIRABILIS CITROBACTER YOUNGAE SUSCEPTIBILITIES PERFORMED ON PREVIOUS CULTURE WITHIN THE LAST 5 DAYS. Performed at [facility], [place] [address] [place], [place] [address]    Report Status 12/16/2019 FINAL  Final  Blood Culture (routine x 2)     Status: Abnormal   Collection Time: 12/11/19  7:45 PM   Specimen: BLOOD  Result Value Ref Range Status   Specimen  Description   Final    BLOOD RIGHT ARM Performed at [facility], [address], [place], [place] [address]    Special Requests   Final    BOTTLES DRAWN AEROBIC AND ANAEROBIC Blood Culture adequate volume Performed at [facility], [address], [place], [place] [address]    Culture  Setup Time   Final    GRAM NEGATIVE RODS IN BOTH AEROBIC AND ANAEROBIC BOTTLES CRITICAL VALUE NOTED.  VALUE IS CONSISTENT WITH PREVIOUSLY REPORTED AND CALLED VALUE. Performed at [facility], [address] [place], [place] [address]    Culture PROTEUS MIRABILIS Su [place]  (A)  Final   Report Status 12/16/2019 FINAL  Final   Organism ID, Bacteria PROTEUS MIRABILIS  Final   Organism ID, Bacteria CITROBACTER YOUNGAE  Final      Susceptibility   Citrobacter youngae - MIC*    CEFAZOLIN >=64 RESISTANT Resistant     CEFEPIME <=1 SENSITIVE Sensitive     CEFTAZIDIME >=64 RESISTANT Resistant     CEFTRIAXONE >=64 RESISTANT Resistant     CIPROFLOXACIN <=0.25 SENSITIVE Sensitive     GENTAMICIN <=1 SENSITIVE Sensitive     IMIPENEM 1 SENSITIVE Sensitive     TRIMETH/SULFA <=20 SENSITIVE Sensitive     PIP/TAZO 64 INTERMEDIATE Intermediate     * CITROBACTER YOUNGAE   Proteus mirabilis - MIC*    AMPICILLIN <=2 SENSITIVE Sensitive     CEFAZOLIN <=4 SENSITIVE Sensitive     CEFEPIME <=1 SENSITIVE Sensitive     CEFTAZIDIME <=1 SENSITIVE Sensitive     CEFTRIAXONE <=1 SENSITIVE Sensitive     CIPROFLOXACIN <=0.25 SENSITIVE Sensitive     GENTAMICIN <=1 SENSITIVE Sensitive     IMIPENEM 4 SENSITIVE Sensitive     TRIMETH/SULFA <=20 SENSITIVE Sensitive     AMPICILLIN/SULBACTAM <=2 SENSITIVE Sensitive     PIP/TAZO <=4 SENSITIVE Sensitive     * PROTEUS MIRABILIS  Blood Culture ID Panel (Reflexed)     Status: Abnormal   Collection Time: 12/11/19  7:45 PM  Result Value Ref Range Status   Enterococcus species NOT DETECTED NOT DETECTED Final   Listeria monocytogenes NOT DETECTED  NOT DETECTED Final   Staphylococcus species NOT DETECTED NOT DETECTED Final   Staphylococcus aureus (BCID) NOT DETECTED NOT DETECTED Final   Streptococcus species NOT DETECTED NOT DETECTED Final   Streptococcus agalactiae NOT DETECTED NOT DETECTED Final   Streptococcus pneumoniae NOT DETECTED NOT DETECTED Final   Streptococcus pyogenes NOT DETECTED NOT DETECTED Final   Acinetobacter baumannii NOT DETECTED NOT DETECTED Final  Enterobacteriaceae species DETECTED (A) NOT DETECTED Final    Comment: Enterobacteriaceae represent a large family of gram-negative bacteria, not a single organism. CRITICAL RESULT CALLED TO, READ BACK BY AND VERIFIED WITH: Taylor Tyler AT 8841 ON 12/12/19 SNG    Enterobacter cloacae complex NOT DETECTED NOT DETECTED Final   Escherichia coli NOT DETECTED NOT DETECTED Final   Klebsiella oxytoca NOT DETECTED NOT DETECTED Final   Klebsiella pneumoniae NOT DETECTED NOT DETECTED Final   Proteus species DETECTED (A) NOT DETECTED Final    Comment: CRITICAL RESULT CALLED TO, READ BACK BY AND VERIFIED WITH: Taylor Tyler AT 6606 ON 12/12/19 SNG    Serratia marcescens NOT DETECTED NOT DETECTED Final   Carbapenem resistance NOT DETECTED NOT DETECTED Final   Haemophilus influenzae NOT DETECTED NOT DETECTED Final   Neisseria meningitidis NOT DETECTED NOT DETECTED Final   Pseudomonas aeruginosa NOT DETECTED NOT DETECTED Final   Candida albicans NOT DETECTED NOT DETECTED Final   Candida glabrata NOT DETECTED NOT DETECTED Final   Candida krusei NOT DETECTED NOT DETECTED Final   Candida parapsilosis NOT DETECTED NOT DETECTED Final   Candida tropicalis NOT DETECTED NOT DETECTED Final    Comment: Performed at [facility], [address], [place], [place] [address]  Urine culture     Status: Abnormal   Collection Time: 12/11/19  8:09 PM   Specimen: In/Out Cath Urine  Result Value Ref Range Status   Specimen Description   Final    IN/OUT CATH URINE Performed at [facility], [address] [place], [place] [address]    Special Requests   Final    NONE Performed at [facility], [address] [place], [place] [address]    Culture MULTIPLE SPECIES PRESENT, SUGGEST RECOLLECTION (A)  Final   Report Status 12/12/2019 FINAL  Final  Respiratory Panel by RT PCR (Flu A&B, Covid) - Nasopharyngeal Swab     Status: None   Collection Time: 12/11/19  9:17 PM   Specimen: Nasopharyngeal Swab  Result Value Ref Range Status   SARS Coronavirus 2 by RT PCR NEGATIVE NEGATIVE Final    Comment: (NOTE) SARS-CoV-2 target nucleic acids are NOT DETECTED. The SARS-CoV-2 RNA is generally detectable in upper respiratoy specimens during the acute phase of infection. The lowest concentration of SARS-CoV-2 viral copies this assay can detect is 131 copies/mL. A negative result does not preclude SARS-Cov-2 infection and should not be used as the sole basis for treatment or other patient management decisions. A negative result may occur with  improper specimen collection/handling, submission of specimen other than nasopharyngeal swab, presence of viral mutation(s) within the areas targeted by this assay, and inadequate number of viral copies (<131 copies/mL). A negative result must be combined with clinical observations, patient history, and epidemiological information. The expected result is Negative. Fact Sheet for Patients:  PinkCheek.be Fact Sheet for Healthcare Providers:  GravelBags.it This test is not yet ap proved or cleared by the [place] FDA and  has been authorized for detection and/or diagnosis of SARS-CoV-2 by FDA under an Emergency Use Authorization (EUA). This EUA will remain  in effect (meaning this test can be used) for the duration of the COVID-19 declaration under Section 564(b)(1) of the Act, 21 U.S.C. section 360bbb-3(b)(1), unless the authorization is terminated or revoked  sooner.    Influenza A by PCR NEGATIVE NEGATIVE Final   Influenza B by PCR NEGATIVE NEGATIVE Final    Comment: (NOTE) The Xpert Xpress SARS-CoV-2/FLU/RSV assay is intended as  an aid in  the diagnosis of influenza from Nasopharyngeal swab specimens and  should not be used as a sole basis for treatment. Nasal washings and  aspirates are unacceptable for Xpert Xpress SARS-CoV-2/FLU/RSV  testing. Fact Sheet for Patients: PinkCheek.be Fact Sheet for Healthcare Providers: GravelBags.it This test is not yet approved or cleared by the [place] FDA and  has been authorized for detection and/or diagnosis of SARS-CoV-2 by  FDA under an Emergency Use Authorization (EUA). This EUA will remain  in effect (meaning this test can be used) for the duration of the  Covid-19 declaration under Section 564(b)(1) of the Act, 21  U.S.C. section 360bbb-3(b)(1), unless the authorization is  terminated or revoked. Performed at [facility], [address] [place], [place] [address]   MRSA PCR Screening     Status: None   Collection Time: 12/12/19 12:29 AM   Specimen: Nasopharyngeal  Result Value Ref Range Status   MRSA by PCR NEGATIVE NEGATIVE Final    Comment:        The GeneXpert MRSA Assay (FDA approved for NASAL specimens only), is one component of a comprehensive MRSA colonization surveillance program. It is not intended to diagnose MRSA infection nor to guide or monitor treatment for MRSA infections. Performed at [facility], [address] [place], [place] [address]   Aerobic/Anaerobic Culture (surgical/deep wound)     Status: None   Collection Time: 12/12/19  2:06 AM   Specimen: Sacral; Wound  Result Value Ref Range Status   Specimen Description   Final    SACRAL Performed at [facility], [address] [place], [place] [address]    Special Requests   Final    NONE Performed at [facility], [place], [place] [address]    Gram Stain NO WBC SEEN NO ORGANISMS SEEN   Final   Culture   Final    FEW ESCHERICHIA COLI FEW CITROBACTER SPECIES FEW PROTEUS MIRABILIS FEW BACTEROIDES OVATUS BETA LACTAMASE POSITIVE Performed at [facility], [place] [address], [place],  [address]    Report Status 12/15/2019 FINAL  Final   Organism ID, Bacteria ESCHERICHIA COLI  Final   Organism ID, Bacteria CITROBACTER SPECIES  Final   Organism ID, Bacteria PROTEUS MIRABILIS  Final      Susceptibility   Citrobacter species - MIC*    CEFAZOLIN >=64 RESISTANT Resistant     CEFEPIME <=1 SENSITIVE Sensitive     CEFTAZIDIME >=64 RESISTANT Resistant     CEFTRIAXONE >=64 RESISTANT Resistant     CIPROFLOXACIN <=0.25 SENSITIVE Sensitive     GENTAMICIN <=1 SENSITIVE Sensitive     IMIPENEM <=0.25 SENSITIVE Sensitive     TRIMETH/SULFA <=20 SENSITIVE Sensitive     PIP/TAZO 64 INTERMEDIATE Intermediate     * FEW CITROBACTER SPECIES   Escherichia coli - MIC*    AMPICILLIN 16 INTERMEDIATE Intermediate     CEFAZOLIN >=64 RESISTANT Resistant     CEFEPIME <=1 SENSITIVE Sensitive     CEFTAZIDIME <=1 SENSITIVE Sensitive     CEFTRIAXONE <=1 SENSITIVE Sensitive     CIPROFLOXACIN <=0.25 SENSITIVE Sensitive     GENTAMICIN <=1 SENSITIVE Sensitive     IMIPENEM <=0.25 SENSITIVE Sensitive     TRIMETH/SULFA <=20 SENSITIVE Sensitive     AMPICILLIN/SULBACTAM 4 SENSITIVE Sensitive     PIP/TAZO <=4 SENSITIVE Sensitive     * FEW ESCHERICHIA COLI   Proteus mirabilis - MIC*    AMPICILLIN <=2 SENSITIVE Sensitive  CEFAZOLIN <=4 SENSITIVE Sensitive     CEFEPIME <=1 SENSITIVE Sensitive     CEFTAZIDIME <=1 SENSITIVE Sensitive     CEFTRIAXONE <=1 SENSITIVE Sensitive     CIPROFLOXACIN <=0.25 SENSITIVE Sensitive     GENTAMICIN 2 SENSITIVE Sensitive     IMIPENEM 4 SENSITIVE Sensitive     TRIMETH/SULFA <=20 SENSITIVE Sensitive     AMPICILLIN/SULBACTAM <=2 SENSITIVE Sensitive      PIP/TAZO <=4 SENSITIVE Sensitive     * FEW PROTEUS MIRABILIS  Aerobic/Anaerobic Culture (surgical/deep wound)     Status: None   Collection Time: 12/12/19  2:06 AM   Specimen: Back; Wound  Result Value Ref Range Status   Specimen Description   Final    BACK LUMBAR SPINE Performed at [facility], [address] [place], [place] [address]    Special Requests   Final    NONE Performed at [facility], [place], [place] [address]    Gram Stain NO WBC SEEN NO ORGANISMS SEEN   Final   Culture   Final    FEW PROTEUS MIRABILIS NO ANAEROBES ISOLATED Performed at [facility], [place] [address] [place], [place] [address]    Report Status 12/17/2019 FINAL  Final   Organism ID, Bacteria PROTEUS MIRABILIS  Final      Susceptibility   Proteus mirabilis - MIC*    AMPICILLIN <=2 SENSITIVE Sensitive     CEFAZOLIN <=4 SENSITIVE Sensitive     CEFEPIME <=1 SENSITIVE Sensitive     CEFTAZIDIME <=1 SENSITIVE Sensitive     CEFTRIAXONE <=1 SENSITIVE Sensitive     CIPROFLOXACIN <=0.25 SENSITIVE Sensitive     GENTAMICIN 2 SENSITIVE Sensitive     IMIPENEM 4 SENSITIVE Sensitive     TRIMETH/SULFA <=20 SENSITIVE Sensitive     AMPICILLIN/SULBACTAM <=2 SENSITIVE Sensitive     PIP/TAZO <=4 SENSITIVE Sensitive     * FEW PROTEUS MIRABILIS  Aerobic/Anaerobic Culture (surgical/deep wound)     Status: None   Collection Time: 12/19/19  3:08 PM   Specimen: Abscess  Result Value Ref Range Status   Specimen Description   Final    ABSCESS Performed at [facility], [address], [place], [place] [address]    Special Requests   Final    NONE Performed at [facility], [address] [place], [place] [address]    Gram Stain   Final    ABUNDANT WBC PRESENT, PREDOMINANTLY PMN RARE GRAM POSITIVE RODS RARE GRAM NEGATIVE RODS    Culture   Final    FEW ESCHERICHIA COLI MODERATE PROTEUS MIRABILIS NO ANAEROBES ISOLATED Performed at [facility], [place] [address] [place],  [address]    Report Status 12/24/2019 FINAL  Final   Organism ID, Bacteria ESCHERICHIA COLI  Final   Organism ID, Bacteria PROTEUS MIRABILIS  Final      Susceptibility   Escherichia coli - MIC*    AMPICILLIN >=32 RESISTANT Resistant     CEFAZOLIN >=64 RESISTANT Resistant     CEFEPIME <=1 SENSITIVE Sensitive     CEFTAZIDIME >=64 RESISTANT Resistant     CEFTRIAXONE >=64 RESISTANT Resistant     CIPROFLOXACIN <=0.25 SENSITIVE Sensitive     GENTAMICIN <=1 SENSITIVE Sensitive     IMIPENEM 1 SENSITIVE Sensitive     TRIMETH/SULFA <=20 SENSITIVE Sensitive     AMPICILLIN/SULBACTAM >=32 RESISTANT Resistant     PIP/TAZO 64 INTERMEDIATE Intermediate     * FEW ESCHERICHIA COLI  Proteus mirabilis - MIC*    AMPICILLIN >=32 RESISTANT Resistant     CEFAZOLIN >=64 RESISTANT Resistant     CEFEPIME RESISTANT Resistant     CEFTAZIDIME >=64 RESISTANT Resistant     CEFTRIAXONE >=64 RESISTANT Resistant     CIPROFLOXACIN <=0.25 SENSITIVE Sensitive     GENTAMICIN <=1 SENSITIVE Sensitive     IMIPENEM 4 SENSITIVE Sensitive     TRIMETH/SULFA <=20 SENSITIVE Sensitive     AMPICILLIN/SULBACTAM <=2 SENSITIVE Sensitive     PIP/TAZO <=4 SENSITIVE Sensitive     * MODERATE PROTEUS MIRABILIS  Culture, blood (Routine X 2) w Reflex to ID Panel     Status: None (Preliminary result)   Collection Time: 12/26/19  1:25 PM   Specimen: BLOOD  Result Value Ref Range Status   Specimen Description BLOOD BLOOD RIGHT WRIST  Final   Special Requests   Final    BOTTLES DRAWN AEROBIC ONLY Blood Culture results may not be optimal due to an inadequate volume of blood received in culture bottles   Culture   Final    NO GROWTH 3 DAYS Performed at [facility], [address], [place], [place] [address]    Report Status PENDING  Incomplete  Culture, blood (Routine X 2) w Reflex to ID Panel     Status: None (Preliminary result)   Collection Time: 12/26/19  3:48 PM   Specimen:  BLOOD  Result Value Ref Range Status   Specimen Description BLOOD [facility]  Final   Special Requests   Final    BOTTLES DRAWN AEROBIC AND ANAEROBIC Blood Culture results may not be optimal due to an inadequate volume of blood received in culture bottles   Culture   Final    NO GROWTH 3 DAYS Performed at [facility], [address] [place], [place] [address]    Report Status PENDING  Incomplete  SARS Coronavirus 2 by RT PCR (hospital order, performed in [place] hospital lab) Nasopharyngeal Nasopharyngeal Swab     Status: None   Collection Time: 12/28/19  4:41 PM   Specimen: Nasopharyngeal Swab  Result Value Ref Range Status   SARS Coronavirus 2 NEGATIVE NEGATIVE Final    Comment: (NOTE) SARS-CoV-2 target nucleic acids are NOT DETECTED. The SARS-CoV-2 RNA is generally detectable in upper and lower respiratory specimens during the acute phase of infection. The lowest concentration of SARS-CoV-2 viral copies this assay can detect is 250 copies / mL. A negative result does not preclude SARS-CoV-2 infection and should not be used as the sole basis for treatment or other patient management decisions.  A negative result may occur with improper specimen collection / handling, submission of specimen other than nasopharyngeal swab, presence of viral mutation(s) within the areas targeted by this assay, and inadequate number of viral copies (<250 copies / mL). A negative result must be combined with clinical observations, patient history, and epidemiological information. Fact Sheet for Patients:   StrictlyIdeas.no Fact Sheet for Healthcare Providers: BankingDealers.co.za This test is not yet approved or cleared  by the [place] FDA and has been authorized for detection and/or diagnosis of SARS-CoV-2 by FDA under an Emergency Use Authorization (EUA).  This EUA will remain in effect (meaning this test can be used) for the duration of  the COVID-19 declaration under Section 564(b)(1) of the Act, 21 U.S.C. section 360bbb-3(b)(1), unless the authorization is terminated or revoked sooner. Performed at [facility], [address], [place], [place] [address]     Coagulation Studies: No  results for input(s): LABPROT, INR in the last 72 hours.  Urinalysis: No results for input(s): COLORURINE, LABSPEC, PHURINE, GLUCOSEU, HGBUR, BILIRUBINUR, KETONESUR, PROTEINUR, UROBILINOGEN, NITRITE, LEUKOCYTESUR in the last 72 hours.  Invalid input(s): APPERANCEUR    Imaging: No results found.   Medications:   . sodium chloride Stopped (12/25/19 1756)  . albumin human Stopped (12/22/19 1346)  . feeding supplement (JEVITY 1.5 CAL/FIBER) 1,000 mL (12/27/19 1723)   . atorvastatin  40 mg Oral q1800  . B-complex with vitamin C  1 tablet Per Tube QHS  . chlorhexidine  15 mL Mouth Rinse BID  . Chlorhexidine Gluconate Cloth  6 each Topical Q0600  . citalopram  10 mg Per Tube Daily  . donepezil  5 mg Per Tube QHS  . epoetin (EPOGEN/PROCRIT) injection  10,000 Units Intravenous Q T,Th,Sa-HD  . feeding supplement (PRO-STAT SUGAR FREE 64)  30 mL Per Tube BID  . folic acid  403 mcg Per Tube Daily  . heparin injection (subcutaneous)  5,000 Units Subcutaneous Q8H  . insulin aspart  0-5 Units Subcutaneous QHS  . insulin aspart  0-9 Units Subcutaneous TID WC  . levothyroxine  100 mcg Per Tube Q0600  . mouth rinse  15 mL Mouth Rinse q12n4p  . memantine  5 mg Per Tube BID  . midodrine  5 mg Per Tube TID WC  . pantoprazole sodium  40 mg Per Tube Daily  . phosphorus  500 mg Per Tube BID  . risperiDONE  0.25 mg Per Tube BID  . sevelamer carbonate  800 mg Oral TID WC  . sodium chloride flush  10-40 mL Intracatheter Q12H  . sulfamethoxazole-trimethoprim  1 tablet Per Tube Q12H   acetaminophen, docusate sodium, HYDROcodone-acetaminophen, ipratropium-albuterol, ondansetron (ZOFRAN) IV, polyethylene glycol, sodium chloride  flush  Assessment/ Plan:   [name] is a 79 y.o. black female with end stage renal disease on hemodialysis, dementia, diabetes mellitus type II, hypertension, hypothyroidism, osteomyelitis, admitted to [facility] on 12/11/2019 for Encephalopathy acute [G93.40] Urinary tract infection, acute [N39.0] Osteomyelitis, unspecified site, unspecified type ([place]) [M86.9]  [facility] ([place]) [facility] [address] TTS RIJ 79kg.  #.  ESRD on HD TTS:   Patient seen during dialysis Tolerating well UF goal ~ 3 L today  Patient was able to sit for Dialysis in chair   #.  Anemia of chronic kidney disease.  Lab Results  Component Value Date   HGB 8.2 (L) 12/29/2019  - transfusion as per hospitalist.  - EPO with HD treatment.  - Appreciate GI input.   #  Secondary hyperparathyroidism: Lab Results  Component Value Date   PTH 68 (H) 12/13/2019   CALCIUM 7.8 (L) 12/29/2019   CAION 1.11 (L) 06/23/2019   PHOS 5.1 (H) 12/29/2019  Monitor calcium and phosphorus during this admission   #Sepsis with stage IV sacral decubitus wound infection with abscess and underlying osteomyelitis, Proteus and Citrobacter bacteremia Status post debridement of necrotic sacral wound Status post CT-guided aspiration Now on Bactrim 400/80 q 12 hrs  Overall prognosis is very poor despite aggressive measures. Appreciate palliative care input.    LOS: [place] 5/27/20212:29 PM

## 2019-12-29 NOTE — Progress Notes (Addendum)
Progress Note    Taylor Tyler  YKD:[id number] DOB: [birth year]  DOA: 12/11/2019 PCP: McLean-Scocuzza, Nino Glow, MD      Brief Narrative:    Medical records reviewed and are as summarized below:  Taylor Tyler is a 79 y.o. female with a history ofend-stage renal disease, hypertension, diabetes, history of lumbar fusion, laminectomy 2014, with revision in 3976 complicated by wound infectiondue to Proteus  She was admitted from skilled nursing facility facility with fever and altered mental status of 1 day duration. In the ED temperature was 101.2, BP 101/44, heart rate of 79 and respiratory rate of 34. As blood pressure dropped further she was admitted to the ICU and started on pressors.        Assessment/Plan:   Active Problems:   Anemia of chronic disease   Hypokalemia   Encephalopathy acute   Sacral decubitus ulcer, stage IV (HCC)   Severe sepsis with septic shock (HCC)   Osteomyelitis (HCC)   Goals of care, counseling/discussion   Palliative care by specialist   DNR (do not resuscitate) discussion   Failure to thrive in adult   S/p septic shock/stage IV sacral decubitus wound infection with abscess and underlying osteomyelitis/Proteus and Citrobacter bacteremia -s/p debridement of necrotic sacral wound. - S/p CT-guided aspiration of 20 mls posterior paraspinal fluid collection.  -recieved IV meropenem while in the hospital and patient now on oral Bactrim indefinitely-appreciate ID recommendations. No growth on repeat blood culture from 12/26/2019  Acute toxic/metabolic encephalopathy: -Patient is more alert but not very communicative -Continue supportive care  Dysphagia -status post peg tube placement by Dr. Alice Reichert 12/24/2019 Continue enteral nutrition via PEG tube.  Follow-up with dietitian for further recommendations.  Hypokalemia and hypophosphatemia--repeleted.   ESRD: Follow-up with nephrologist  Anemia of chronic disease: Continue epoetin.  Monitor H&H. S/p transfusion with one unit on  PRBCs 5/22.  H&H is stable.   Hypothyroidism:  -synthroid  Dementia Continue aricept, risperdal and nameda  Unstageable sacral decubitus ulcer and left heel deep tissue injury (present on admission): Continue local wound care per wound nurse's recommendation. Turn patient every 2 hours.  Overall poor prognosis long term, slow improvement Palliative care input appreciated--pt is FULL code, will need to follow with Palliative care   Body mass index is 32.99 kg/m.  (Obesity)   Family Communication/Anticipated D/C date and plan/Code Status   DVT prophylaxis: Heparin Code Status: Full code Family Communication: None Disposition Plan:    Status is: Inpatient  Remains inpatient appropriate because:Unsafe d/c plan and Inpatient level of care appropriate due to severity of illness   Dispo: The patient is from: SNF              Anticipated d/c is to: SNF              Anticipated d/c date is: 1 day              Patient currently is medically stable to d/c.  Awaiting placement to SNF pending insurance authorization.            Subjective:   She is unable to provide an adequate history because of confusion.  She could not utter a word today at the time of my visit.  Objective:    Vitals:   12/28/19 2254 12/29/19 0753 12/29/19 0756 12/29/19 1607  BP: (!) 142/55 (!) 173/59 (!) 165/55 137/63  Pulse: 90 89 87 (!) 107  Resp: 16 18  18   Temp: 99.8 F (  37.7 C) 98.4 F (36.9 C)  98.9 F (37.2 C)  TempSrc: Oral Oral  Oral  SpO2: 98% 100%  100%  Weight:      Height:       No data found.   Intake/Output Summary (Last 24 hours) at 12/29/2019 1735 Last data filed at 12/29/2019 0500 Gross per 24 hour  Intake 610 ml  Output 1 ml  Net 609 ml   Filed Weights   12/25/19 0500 12/27/19 1241 12/28/19 0432  Weight: 89.4 kg 89.4 kg 92.7 kg    Exam:  GEN: No acute distress  SKIN: Stage IV sacral decubitus ulcer, deep  tissue injury of the left heel (present on admission) EYES: No pallor or icterus ENT: MMM CV: RRR PULM: CTA B ABD: soft, ND, NT, +BS CNS: Alert.  Patient unable to participate in full neurological exam. EXT: Edema of bilateral upper and lower extremities.  No tenderness.   Data Reviewed:   I have personally reviewed following labs and imaging studies:  Labs: Labs show the following:   Basic Metabolic Panel: Recent Labs  Lab 12/23/19 1319 12/23/19 1319 12/24/19 0424 12/24/19 0424 12/25/19 0514 12/29/19 0630  NA 139  --  140  --  140 138  K 2.9*   < > 2.9*   < > 4.1 4.1  CL 101  --  103  --  103 99  CO2 29  --  29  --  28 31  GLUCOSE 204*  --  144*  --  85 153*  BUN 35*  --  44*  --  27* 45*  CREATININE 1.83*  --  1.97*  --  1.52* 1.93*  CALCIUM 7.9*  --  7.9*  --  8.0* 7.8*  MG 1.5*  --  1.9  --  1.8  --   PHOS 1.6*  --  2.8  --  3.1 5.1*   < > = values in this interval not displayed.   GFR Estimated Creatinine Clearance: 28.5 mL/min (A) (by C-G formula based on SCr of 1.93 mg/dL (H)). Liver Function Tests: Recent Labs  Lab 12/29/19 0630  ALBUMIN 1.8*   No results for input(s): LIPASE, AMYLASE in the last 168 hours. No results for input(s): AMMONIA in the last 168 hours. Coagulation profile No results for input(s): INR, PROTIME in the last 168 hours.  CBC: Recent Labs  Lab 12/23/19 1445 12/24/19 1745 12/29/19 0630  WBC  --   --  7.9  NEUTROABS  --   --  4.9  HGB 6.8* 10.1* 8.2*  HCT  --  34.4* 27.3*  MCV  --   --  98.6  PLT  --   --  166   Cardiac Enzymes: No results for input(s): CKTOTAL, CKMB, CKMBINDEX, TROPONINI in the last 168 hours. BNP (last 3 results) No results for input(s): PROBNP in the last 8760 hours. CBG: Recent Labs  Lab 12/28/19 0754 12/28/19 1144 12/28/19 1654 12/28/19 2058 12/29/19 0754  GLUCAP 117* 124* 120* 110* 143*   D-Dimer: No results for input(s): DDIMER in the last 72 hours. Hgb A1c: No results for input(s):  HGBA1C in the last 72 hours. Lipid Profile: No results for input(s): CHOL, HDL, LDLCALC, TRIG, CHOLHDL, LDLDIRECT in the last 72 hours. Thyroid function studies: No results for input(s): TSH, T4TOTAL, T3FREE, THYROIDAB in the last 72 hours.  Invalid input(s): FREET3 Anemia work up: No results for input(s): VITAMINB12, FOLATE, FERRITIN, TIBC, IRON, RETICCTPCT in the last 72 hours. Sepsis Labs: Recent Labs  Lab 12/29/19 0630  WBC 7.9    Microbiology Recent Results (from the past 240 hour(s))  Culture, blood (Routine X 2) w Reflex to ID Panel     Status: None (Preliminary result)   Collection Time: 12/26/19  1:25 PM   Specimen: BLOOD  Result Value Ref Range Status   Specimen Description BLOOD BLOOD RIGHT WRIST  Final   Special Requests   Final    BOTTLES DRAWN AEROBIC ONLY Blood Culture results may not be optimal due to an inadequate volume of blood received in culture bottles   Culture   Final    NO GROWTH 3 DAYS Performed at [facility], [address], [place], [place] [address]    Report Status PENDING  Incomplete  Culture, blood (Routine X 2) w Reflex to ID Panel     Status: None (Preliminary result)   Collection Time: 12/26/19  3:48 PM   Specimen: BLOOD  Result Value Ref Range Status   Specimen Description BLOOD [facility]  Final   Special Requests   Final    BOTTLES DRAWN AEROBIC AND ANAEROBIC Blood Culture results may not be optimal due to an inadequate volume of blood received in culture bottles   Culture   Final    NO GROWTH 3 DAYS Performed at [facility], [address] [place], [place] [address]    Report Status PENDING  Incomplete  SARS Coronavirus 2 by RT PCR (hospital order, performed in [place] hospital lab) Nasopharyngeal Nasopharyngeal Swab     Status: None   Collection Time: 12/28/19  4:41 PM   Specimen: Nasopharyngeal Swab  Result Value Ref Range Status   SARS Coronavirus 2 NEGATIVE NEGATIVE Final    Comment: (NOTE) SARS-CoV-2  target nucleic acids are NOT DETECTED. The SARS-CoV-2 RNA is generally detectable in upper and lower respiratory specimens during the acute phase of infection. The lowest concentration of SARS-CoV-2 viral copies this assay can detect is 250 copies / mL. A negative result does not preclude SARS-CoV-2 infection and should not be used as the sole basis for treatment or other patient management decisions.  A negative result may occur with improper specimen collection / handling, submission of specimen other than nasopharyngeal swab, presence of viral mutation(s) within the areas targeted by this assay, and inadequate number of viral copies (<250 copies / mL). A negative result must be combined with clinical observations, patient history, and epidemiological information. Fact Sheet for Patients:   StrictlyIdeas.no Fact Sheet for Healthcare Providers: BankingDealers.co.za This test is not yet approved or cleared  by the [place] FDA and has been authorized for detection and/or diagnosis of SARS-CoV-2 by FDA under an Emergency Use Authorization (EUA).  This EUA will remain in effect (meaning this test can be used) for the duration of the COVID-19 declaration under Section 564(b)(1) of the Act, 21 U.S.C. section 360bbb-3(b)(1), unless the authorization is terminated or revoked sooner. Performed at [facility] [address] [place], [place] [address]     Procedures and diagnostic studies:  No results found.  Medications:   . atorvastatin  40 mg Oral q1800  . B-complex with vitamin C  1 tablet Per Tube QHS  . chlorhexidine  15 mL Mouth Rinse BID  . Chlorhexidine Gluconate Cloth  6 each Topical Q0600  . citalopram  10 mg Per Tube Daily  . donepezil  5 mg Per Tube QHS  . epoetin (EPOGEN/PROCRIT) injection  10,000 Units Intravenous Q T,Th,Sa-HD  . feeding supplement (PRO-STAT SUGAR  FREE 64)  30 mL Per Tube BID  . folic acid   947 mcg Per Tube Daily  . heparin injection (subcutaneous)  5,000 Units Subcutaneous Q8H  . insulin aspart  0-5 Units Subcutaneous QHS  . insulin aspart  0-9 Units Subcutaneous TID WC  . levothyroxine  100 mcg Per Tube Q0600  . mouth rinse  15 mL Mouth Rinse q12n4p  . memantine  5 mg Per Tube BID  . midodrine  5 mg Per Tube TID WC  . pantoprazole sodium  40 mg Per Tube Daily  . phosphorus  500 mg Per Tube BID  . risperiDONE  0.25 mg Per Tube BID  . sevelamer carbonate  800 mg Oral TID WC  . sodium chloride flush  10-40 mL Intracatheter Q12H  . sulfamethoxazole-trimethoprim  1 tablet Per Tube Q12H   Continuous Infusions: . sodium chloride Stopped (12/25/19 1756)  . albumin human Stopped (12/22/19 1346)  . feeding supplement (JEVITY 1.5 CAL/FIBER) 1,000 mL (12/27/19 1723)     LOS: 18 days   Danicka Hourihan  [facility]     12/29/2019, 5:35 PM

## 2019-12-29 NOTE — TOC Progression Note (Signed)
Transition of Care [facility]) - Progression Note    Patient Details  Name: Taylor Tyler MRN: [id number] Date of Birth: 06/17/1941  [facility] CM/SW Contact  Judeen Geralds, Gardiner Rhyme, LCSW Phone Number: 12/29/2019, 3:33 PM  Clinical Narrative:  Unfortuantely insurance has not been approved yet and will need auth before can transfer pt back to [facility]. Will message MD and let him know. [employer] takes 24 hr to approve insurance.          Expected Discharge Plan and Services                                                 Social Determinants of Health (SDOH) Interventions    Readmission Risk Interventions No flowsheet data found.

## 2019-12-29 NOTE — Plan of Care (Signed)
  Problem: Nutrition: Goal: Adequate nutrition will be maintained Outcome: Progressing   Problem: Elimination: Goal: Will not experience complications related to bowel motility Outcome: Progressing   Problem: Pain Managment: Goal: General experience of comfort will improve Outcome: Progressing   Problem: Safety: Goal: Ability to remain free from injury will improve Outcome: Progressing   

## 2019-12-30 DIAGNOSIS — R627 Adult failure to thrive: Secondary | ICD-10-CM | POA: Diagnosis not present

## 2019-12-30 DIAGNOSIS — N390 Urinary tract infection, site not specified: Secondary | ICD-10-CM | POA: Diagnosis not present

## 2019-12-30 DIAGNOSIS — M869 Osteomyelitis, unspecified: Secondary | ICD-10-CM | POA: Diagnosis not present

## 2019-12-30 DIAGNOSIS — R2681 Unsteadiness on feet: Secondary | ICD-10-CM | POA: Diagnosis not present

## 2019-12-30 DIAGNOSIS — R279 Unspecified lack of coordination: Secondary | ICD-10-CM | POA: Diagnosis not present

## 2019-12-30 DIAGNOSIS — Z992 Dependence on renal dialysis: Secondary | ICD-10-CM | POA: Diagnosis not present

## 2019-12-30 DIAGNOSIS — D631 Anemia in chronic kidney disease: Secondary | ICD-10-CM | POA: Diagnosis not present

## 2019-12-30 DIAGNOSIS — Z431 Encounter for attention to gastrostomy: Secondary | ICD-10-CM | POA: Diagnosis not present

## 2019-12-30 DIAGNOSIS — Z23 Encounter for immunization: Secondary | ICD-10-CM | POA: Diagnosis not present

## 2019-12-30 DIAGNOSIS — L89154 Pressure ulcer of sacral region, stage 4: Secondary | ICD-10-CM | POA: Diagnosis not present

## 2019-12-30 DIAGNOSIS — R5381 Other malaise: Secondary | ICD-10-CM | POA: Diagnosis not present

## 2019-12-30 DIAGNOSIS — G9340 Encephalopathy, unspecified: Secondary | ICD-10-CM | POA: Diagnosis not present

## 2019-12-30 DIAGNOSIS — M6281 Muscle weakness (generalized): Secondary | ICD-10-CM | POA: Diagnosis not present

## 2019-12-30 DIAGNOSIS — I5032 Chronic diastolic (congestive) heart failure: Secondary | ICD-10-CM | POA: Diagnosis not present

## 2019-12-30 DIAGNOSIS — M4628 Osteomyelitis of vertebra, sacral and sacrococcygeal region: Secondary | ICD-10-CM | POA: Diagnosis not present

## 2019-12-30 DIAGNOSIS — N184 Chronic kidney disease, stage 4 (severe): Secondary | ICD-10-CM | POA: Diagnosis not present

## 2019-12-30 DIAGNOSIS — A419 Sepsis, unspecified organism: Secondary | ICD-10-CM | POA: Diagnosis not present

## 2019-12-30 DIAGNOSIS — E8779 Other fluid overload: Secondary | ICD-10-CM | POA: Diagnosis not present

## 2019-12-30 DIAGNOSIS — I5033 Acute on chronic diastolic (congestive) heart failure: Secondary | ICD-10-CM | POA: Diagnosis not present

## 2019-12-30 DIAGNOSIS — E1129 Type 2 diabetes mellitus with other diabetic kidney complication: Secondary | ICD-10-CM | POA: Diagnosis not present

## 2019-12-30 DIAGNOSIS — D509 Iron deficiency anemia, unspecified: Secondary | ICD-10-CM | POA: Diagnosis not present

## 2019-12-30 DIAGNOSIS — N186 End stage renal disease: Secondary | ICD-10-CM | POA: Diagnosis not present

## 2019-12-30 DIAGNOSIS — E1122 Type 2 diabetes mellitus with diabetic chronic kidney disease: Secondary | ICD-10-CM | POA: Diagnosis not present

## 2019-12-30 DIAGNOSIS — D689 Coagulation defect, unspecified: Secondary | ICD-10-CM | POA: Diagnosis not present

## 2019-12-30 DIAGNOSIS — N2581 Secondary hyperparathyroidism of renal origin: Secondary | ICD-10-CM | POA: Diagnosis not present

## 2019-12-30 DIAGNOSIS — D638 Anemia in other chronic diseases classified elsewhere: Secondary | ICD-10-CM | POA: Diagnosis not present

## 2019-12-30 LAB — GLUCOSE, CAPILLARY
Glucose-Capillary: 136 mg/dL — ABNORMAL HIGH (ref 70–99)
Glucose-Capillary: 133 mg/dL — ABNORMAL HIGH (ref 70–99)

## 2019-12-30 MED ORDER — SULFAMETHOXAZOLE-TRIMETHOPRIM 400-80 MG PO TABS: 1.0000 | ORAL_TABLET | Freq: Two times a day (BID) | ORAL | Status: DC

## 2019-12-30 MED ORDER — MIDODRINE HCL 5 MG PO TABS: 5.0000 mg | ORAL_TABLET | Freq: Three times a day (TID) | ORAL | Status: AC

## 2019-12-30 MED ORDER — B COMPLEX-C PO TABS: 1.0000 | ORAL_TABLET | Freq: Every day | ORAL | Status: AC

## 2019-12-30 MED ORDER — PRO-STAT SUGAR FREE PO LIQD: 30.0000 mL | Freq: Two times a day (BID) | ORAL | 0 refills | Status: AC

## 2019-12-30 MED ORDER — JEVITY 1.5 CAL/FIBER PO LIQD: 1000.0000 mL | ORAL | Status: AC

## 2019-12-30 NOTE — Plan of Care (Signed)
°  Problem: Clinical Measurements: °Goal: Ability to maintain clinical measurements within normal limits will improve °Outcome: Progressing °Goal: Will remain free from infection °Outcome: Progressing °Goal: Diagnostic test results will improve °Outcome: Progressing °  °Problem: Pain Managment: °Goal: General experience of comfort will improve °Outcome: Progressing °  °

## 2019-12-30 NOTE — Progress Notes (Signed)
Physical Therapy Treatment Patient Details Name: Taylor Tyler MRN: [id number] DOB: January 12, 1941 Today's Date: 12/30/2019    History of Present Illness presented to ER secondary to decreased level of responsiveness, hypoxia; admitted for management of toxic metabolic encephalopathy due to sepsis related to UTI, non-healing sacral decubitus ulcer.  Imaging suggestive of osteomyelitis, L4 post-op paraspinal abscess. Hospital course significant for PEG tube placement (5/22) and CT-guided aspiration of posterior paraspinal collection (5/17)    PT Comments    Pt awake in bed.  Mostly non-verbal.  PROM B LE heel slides, ab/add, SLR,  x 10.  She makes no effort to participate in ex's.  Per notes she is able to tolerate OOB for dialysis session.  Deferred bed mobility skills due to lack of participation with ex's and rectal tube in place so not to displace tube.   Follow Up Recommendations  SNF     Equipment Recommendations       Recommendations for Other Services       Precautions / Restrictions Precautions Precautions: Fall Restrictions Weight Bearing Restrictions: No Other Position/Activity Restrictions: PEG, stage IV sacral decub    Mobility  Bed Mobility               General bed mobility comments: deferred  Transfers                    Ambulation/Gait                 Stairs             Wheelchair Mobility    Modified Rankin (Stroke Patients Only)       Balance                                            Cognition Arousal/Alertness: Awake/alert Behavior During Therapy: Flat affect Overall Cognitive Status: Difficult to assess                                 General Comments: Pt is not very verbal but does have eyes open and is able to shake head no when asked if she needed anything before leaving.      Exercises      General Comments        Pertinent Vitals/Pain Pain Assessment: Faces Faces  Pain Scale: Hurts even more Pain Location: R UE, bilat ankle Pain Descriptors / Indicators: Grimacing Pain Intervention(s): Limited activity within patient's tolerance;Monitored during session;Repositioned    Home Living                      Prior Function            PT Goals (current goals can now be found in the care plan section) Progress towards PT goals: Progressing toward goals    Frequency    Min 2X/week      PT Plan Current plan remains appropriate    Co-evaluation              AM-PAC PT "6 Clicks" Mobility   Outcome Measure  Help needed turning from your back to your side while in a flat bed without using bedrails?: Total Help needed moving from lying on your back to sitting on the side of a flat bed without using bedrails?: Total Help  needed moving to and from a bed to a chair (including a wheelchair)?: Total Help needed standing up from a chair using your arms (e.g., wheelchair or bedside chair)?: Total Help needed to walk in hospital room?: Total Help needed climbing 3-5 steps with a railing? : Total 6 Click Score: 6    End of Session   Activity Tolerance: Patient limited by pain;Other (comment) Patient left: in bed;with call bell/phone within reach;with bed alarm set;with SCD's reapplied Nurse Communication: Mobility status       Time: 8979-1504 PT Time Calculation (min) (ACUTE ONLY): 8 min  Charges:  $Therapeutic Exercise: 8-22 mins                     Chesley Noon, PTA 12/30/19, 10:49 AM

## 2019-12-30 NOTE — TOC Transition Note (Signed)
Transition of Care [facility]) - CM/SW Discharge Note   Patient Details  Name: Taylor Tyler MRN: [id number] Date of Birth: 1941/01/21  [facility]) CM/SW Contact:  Elease Hashimoto, LCSW Phone Number: 12/30/2019, 1:45 PM   Clinical Narrative:  Pt is medically stable for transfer back to [facility]. Insurance auth received via [employer] [id number] 5/28-6/2. Son aware of this plan. Awaiting DC paperwork to call EMS. Bedside RN to call report to [phone] . Called [facility] for transport to facility.    Final next level of care: Skilled Nursing Facility Barriers to Discharge: Barriers Resolved   Patient Goals and CMS Choice        Discharge Placement   Existing PASRR number confirmed : 11/22/19          Patient chooses bed at: [facility] Patient to be transferred to facility by: EMS Name of family member notified: [name]-son Patient and family notified of of transfer: 12/30/19  Discharge Plan and Services                                     Social Determinants of Health (SDOH) Interventions     Readmission Risk Interventions No flowsheet data found.

## 2019-12-30 NOTE — Discharge Summary (Signed)
Physician Discharge Summary  Taylor Tyler:[id number] DOB: 12-21-1940 DOA: 12/11/2019  PCP: McLean-Scocuzza, Nino Glow, MD  Admit date: 12/11/2019 Discharge date: 12/30/2019  Discharge disposition: Skilled nursing facility   Recommendations for Outpatient Follow-Up:   Outpatient follow-up with physician at the nursing home within 3 days of discharge Outpatient follow-up with nephrologist for hemodialysis   Discharge Diagnosis:   Active Problems:   Anemia of chronic disease   Hypokalemia   Encephalopathy acute   Sacral decubitus ulcer, stage IV (HCC)   Severe sepsis with septic shock (HCC)   Osteomyelitis ([place])   Goals of care, counseling/discussion   Palliative care by specialist   DNR (do not resuscitate) discussion   Failure to thrive in adult    Discharge Condition: Stable.  Diet recommendation: Enteral nutrition/tube feeding via PEG tube  Code status: Full code.    Hospital Course:   [name] is a 79 y.o. female with a history of dementia,end-stage renal disease, hypertension, diabetes, history of lumbar fusion, laminectomy 2014, with revision in 8119 complicated by wound infectiondue to Proteus  She was admitted from skilled nursing facility facility with fever and altered mental status of 1 day duration. In the ED temperature was 101.2, BP 101/44, heart rate of 79 and respiratory rate of 34. Her blood pressure dropped. She was transferred to the ICU where she was treated for septic shock. She was treated with empiric IV antibiotics and vasopressors.  She has stage IV sacral decubitus ulcer that was infected with underlying abscess and osteomyelitis. This was the source of her septic shock. She was seen by general surgeon and the sacral wound was debrided at the bedside. She was also seen by interventional radiologist and she underwent CT-guided aspiration of 20 mls of posterior paraspinal fluid collection. Blood culture revealed Proteus and Citrobacter  species. ID specialist was consulted to assist with antimicrobial therapy. ID recommended that patient be discharged on oral Bactrim indefinitely.  Patient also developed acute toxic metabolic encephalopathy. Mentation is slowly improved. However, her speech or communication has not returned to baseline. Occasionally, she will utter a few words but she is mostly nonverbal.  She also developed dysphagia and required enteral nutrition via nasogastric tube. Gastroenterology was consulted and PEG tube was placed on 12/24/2019 per family's request.  She was seen by the nephrologist for end-stage renal disease and she underwent hemodialysis. She had hypokalemia and hypophosphatemia that were repleted. She also developed severe anemia requiring blood transfusion.  PT and OT recommended further rehabilitation at a skilled nursing facility. She is deemed medically stable for discharge to SNF today. Overall, her prognosis is poor. Discharge plan was discussed with the patient's son, [name]. All his questions were answered.       Discharge Exam:   Vitals:   12/30/19 0031 12/30/19 0826  BP: 136/76 132/65  Pulse: (!) 103 90  Resp: 17 17  Temp: 99.6 F (37.6 C) 98.2 F (36.8 C)  SpO2: 98% 100%   Vitals:   12/29/19 1607 12/30/19 0031 12/30/19 0500 12/30/19 0826  BP: 137/63 136/76  132/65  Pulse: (!) 107 (!) 103  90  Resp: 18 17  17   Temp: 98.9 F (37.2 C) 99.6 F (37.6 C)  98.2 F (36.8 C)  TempSrc: Oral   Oral  SpO2: 100% 98%  100%  Weight:   88.5 kg   Height:         GEN: No acute distress  SKIN: Stage IV sacral decubitus ulcer, deep tissue  injury of the left heel (present on admission) EYES: No pallor or icterus ENT: MMM CV: RRR PULM: CTA B ABD: soft, ND, NT, +BS, +PEG tube CNS: Alert but nonverbal.  Patient unable to participate in full neurological exam. EXT: Edema of bilateral upper and lower extremities.  No tenderness.    The results of significant diagnostics from this  hospitalization (including imaging, microbiology, ancillary and laboratory) are listed below for reference.     Procedures and Diagnostic Studies:   CT ABDOMEN PELVIS WO CONTRAST  Result Date: 12/11/2019 CLINICAL DATA:  Unresponsive. EXAM: CT ABDOMEN AND PELVIS WITHOUT CONTRAST TECHNIQUE: Multidetector CT imaging of the abdomen and pelvis was performed following the standard protocol without IV contrast. COMPARISON:  March 29, 2017 FINDINGS: Lower chest: No acute abnormality. Hepatobiliary: No focal liver abnormality is seen. No gallstones, gallbladder wall thickening, or biliary dilatation. Pancreas: Unremarkable. No pancreatic ductal dilatation or surrounding inflammatory changes. Spleen: Normal in size without focal abnormality. Adrenals/Urinary Tract: Adrenal glands are unremarkable. Kidneys are normal in size, without focal lesions or hydronephrosis. A 6 mm nonobstructing renal stone is seen within the lower pole of the left kidney. A Foley catheter is seen within the urinary bladder. Stomach/Bowel: There is a small hiatal hernia. The appendix is not identified. Surgically anastomosed bowel is seen within the mid sigmoid colon. Noninflamed diverticula are also seen within the proximal sigmoid colon. No evidence of bowel dilatation. Vascular/Lymphatic: Moderate to marked severity aortic calcification. No enlarged abdominal or pelvic lymph nodes. Reproductive: Status post hysterectomy. No adnexal masses. Other: There is a predominant stable 11.9 cm x 3.7 cm ventral hernia seen just above the level of the umbilicus. This contains fat and a segment of nondilated transverse colon. An additional 13.4 cm x 6.2 cm ventral hernia is seen along the anterior aspect of the lower pelvic wall. This is mildly increased in size when compared to the prior study and contains fat and multiple nondilated loops of distal small bowel. Musculoskeletal: Multilevel degenerative changes seen throughout the lumbar spine with a  metallic density fusion plate and screws seen along the anterior aspect of the levels of L5 and S1. This represents a new finding when compared to the prior exam. The multiple bilateral pedicle screws seen throughout the lumbar spine on the prior study have been removed. Additional postoperative changes are seen involving the posterior elements of the lumbar spine. A 6.2 cm x 4.5 cm area of air and fluid attenuation is seen posterior to the spinal canal at the level of the L4 vertebral body. Tiny foci of air are seen within the prevertebral soft tissues at the level of L4-L5, and within the adjacent portion of the lower left hemipelvis. These extend to the region adjacent to the mid sigmoid colon. A 3.2 cm x 3.1 cm x 2.0 cm soft tissue defect is seen along the posterior aspect of the lumbar spine at the level of L3 and L4. A 3.4 cm by 0.5 cm sacral decubitus ulcer is seen along the mid to lower sacrum, to the left of midline. A small amount of adjacent cortical destruction is seen. IMPRESSION: 1. Extensive postoperative changes within the lumbar spine, as described above, with a 6.2 cm x 4.5 cm area of air and fluid attenuation seen posterior to the spinal canal at the level of the L4 vertebral body. This may represent a postoperative collection/abscess. 2. Large, ventral hernias, as described above. 3. Sacral decubitus ulcer with additional findings suggestive of acute osteomyelitis. 4. Stable 6 mm  nonobstructing renal stone within the left kidney. 5. Sigmoid diverticulosis. Aortic Atherosclerosis (ICD10-I70.0). Electronically Signed   By: Virgina Norfolk M.D.   On: 12/11/2019 22:02   CT HEAD WO CONTRAST  Result Date: 12/11/2019 CLINICAL DATA:  Unresponsive. EXAM: CT HEAD WITHOUT CONTRAST TECHNIQUE: Contiguous axial images were obtained from the base of the skull through the vertex without intravenous contrast. COMPARISON:  November 19, 2019 FINDINGS: Brain: There is mild to moderate severity cerebral atrophy  with widening of the extra-axial spaces and ventricular dilatation. There are areas of decreased attenuation within the white matter tracts of the supratentorial brain, consistent with microvascular disease changes. Vascular: No hyperdense vessel or unexpected calcification. Skull: Normal. Negative for fracture or focal lesion. Sinuses/Orbits: There is mild sphenoid sinus mucosal thickening. Other: None. IMPRESSION: 1. No acute intracranial abnormality. 2. Mild to moderate severity cerebral atrophy and microvascular disease changes of the supratentorial brain. 3. Mild sphenoid sinus disease. Electronically Signed   By: Virgina Norfolk M.D.   On: 12/11/2019 21:32   DG Chest Port 1 View  Result Date: 12/12/2019 CLINICAL DATA:  Central line placement EXAM: PORTABLE CHEST 1 VIEW COMPARISON:  12/11/2019 FINDINGS: Single frontal view of the chest demonstrates left internal jugular catheter tip overlying superior vena cava. Stable right internal jugular dialysis catheter. The cardiac silhouette is enlarged. There is central vascular congestion, with interval development of patchy bilateral perihilar ground-glass airspace disease. No effusion or pneumothorax. No acute bony abnormalities. IMPRESSION: 1. No complication after left internal jugular catheter placement. 2. Worsening volume status, with development of mild pulmonary edema. Electronically Signed   By: Randa Ngo M.D.   On: 12/12/2019 02:03   DG Chest Port 1 View  Result Date: 12/11/2019 CLINICAL DATA:  79 year old female with sepsis. EXAM: PORTABLE CHEST 1 VIEW COMPARISON:  Chest radiograph dated 11/19/2019. FINDINGS: Dialysis catheter in similar position. There is stable cardiomegaly. Probable mild vascular congestion. No edema. No focal consolidation, pleural effusion, pneumothorax. Atherosclerotic calcification of the aorta. No acute osseous pathology. Degenerative changes of the shoulders. IMPRESSION: No focal consolidation.  Probable mild  vascular congestion. Electronically Signed   By: Anner Crete M.D.   On: 12/11/2019 20:33     Labs:   Basic Metabolic Panel: Recent Labs  Lab 12/24/19 0424 12/24/19 0424 12/25/19 0514 12/29/19 0630  NA 140  --  140 138  K 2.9*   < > 4.1 4.1  CL 103  --  103 99  CO2 29  --  28 31  GLUCOSE 144*  --  85 153*  BUN 44*  --  27* 45*  CREATININE 1.97*  --  1.52* 1.93*  CALCIUM 7.9*  --  8.0* 7.8*  MG 1.9  --  1.8  --   PHOS 2.8  --  3.1 5.1*   < > = values in this interval not displayed.   GFR Estimated Creatinine Clearance: 27.8 mL/min (A) (by C-G formula based on SCr of 1.93 mg/dL (H)). Liver Function Tests: Recent Labs  Lab 12/29/19 0630  ALBUMIN 1.8*   No results for input(s): LIPASE, AMYLASE in the last 168 hours. No results for input(s): AMMONIA in the last 168 hours. Coagulation profile No results for input(s): INR, PROTIME in the last 168 hours.  CBC: Recent Labs  Lab 12/23/19 1445 12/24/19 1745 12/29/19 0630  WBC  --   --  7.9  NEUTROABS  --   --  4.9  HGB 6.8* 10.1* 8.2*  HCT  --  34.4* 27.3*  MCV  --   --  98.6  PLT  --   --  166   Cardiac Enzymes: No results for input(s): CKTOTAL, CKMB, CKMBINDEX, TROPONINI in the last 168 hours. BNP: Invalid input(s): POCBNP CBG: Recent Labs  Lab 12/28/19 2058 12/29/19 0754 12/29/19 2135 12/30/19 0825 12/30/19 1138  GLUCAP 110* 143* 119* 136* 133*   D-Dimer No results for input(s): DDIMER in the last 72 hours. Hgb A1c No results for input(s): HGBA1C in the last 72 hours. Lipid Profile No results for input(s): CHOL, HDL, LDLCALC, TRIG, CHOLHDL, LDLDIRECT in the last 72 hours. Thyroid function studies No results for input(s): TSH, T4TOTAL, T3FREE, THYROIDAB in the last 72 hours.  Invalid input(s): FREET3 Anemia work up No results for input(s): VITAMINB12, FOLATE, FERRITIN, TIBC, IRON, RETICCTPCT in the last 72 hours. Microbiology Recent Results (from the past 240 hour(s))  Culture, blood (Routine  X 2) w Reflex to ID Panel     Status: None (Preliminary result)   Collection Time: 12/26/19  1:25 PM   Specimen: BLOOD  Result Value Ref Range Status   Specimen Description BLOOD BLOOD RIGHT WRIST  Final   Special Requests   Final    BOTTLES DRAWN AEROBIC ONLY Blood Culture results may not be optimal due to an inadequate volume of blood received in culture bottles   Culture   Final    NO GROWTH 4 DAYS Performed at [facility], [address], [place], [place] [address]    Report Status PENDING  Incomplete  Culture, blood (Routine X 2) w Reflex to ID Panel     Status: None (Preliminary result)   Collection Time: 12/26/19  3:48 PM   Specimen: BLOOD  Result Value Ref Range Status   Specimen Description BLOOD [facility]  Final   Special Requests   Final    BOTTLES DRAWN AEROBIC AND ANAEROBIC Blood Culture results may not be optimal due to an inadequate volume of blood received in culture bottles   Culture   Final    NO GROWTH 4 DAYS Performed at [facility], [address], [place], [place] [address]    Report Status PENDING  Incomplete  SARS Coronavirus 2 by RT PCR (hospital order, performed in [place] hospital lab) Nasopharyngeal Nasopharyngeal Swab     Status: None   Collection Time: 12/28/19  4:41 PM   Specimen: Nasopharyngeal Swab  Result Value Ref Range Status   SARS Coronavirus 2 NEGATIVE NEGATIVE Final    Comment: (NOTE) SARS-CoV-2 target nucleic acids are NOT DETECTED. The SARS-CoV-2 RNA is generally detectable in upper and lower respiratory specimens during the acute phase of infection. The lowest concentration of SARS-CoV-2 viral copies this assay can detect is 250 copies / mL. A negative result does not preclude SARS-CoV-2 infection and should not be used as the sole basis for treatment or other patient management decisions.  A negative result may occur with improper specimen collection / handling, submission of specimen other than nasopharyngeal swab,  presence of viral mutation(s) within the areas targeted by this assay, and inadequate number of viral copies (<250 copies / mL). A negative result must be combined with clinical observations, patient history, and epidemiological information. Fact Sheet for Patients:   StrictlyIdeas.no Fact Sheet for Healthcare Providers: BankingDealers.co.za This test is not yet approved or cleared  by the [place] FDA and has been authorized for detection and/or diagnosis of SARS-CoV-2 by FDA under an Emergency Use Authorization (EUA).  This EUA will remain in effect (meaning this test can be used) for  the duration of the COVID-19 declaration under Section 564(b)(1) of the Act, 21 U.S.C. section 360bbb-3(b)(1), unless the authorization is terminated or revoked sooner. Performed at [facility], [address] [place], [place] [address]      Discharge Instructions:   Discharge Instructions    Discharge instructions   Complete by: As directed    All medications should be given via  PEG tube   Increase activity slowly   Complete by: As directed      Allergies as of 12/30/2019      Reactions   Clams [shellfish Allergy] Swelling, Other (See Comments)   THROAT SWELLS NECK TURNS RED   Hydralazine Other (See Comments)   CHEST TIGHTNESS Patient has tolerated multiple doses of hydralazine since allergy was listed    Norvasc [amlodipine Besylate] Swelling   Leg edema    Milk-related Compounds Diarrhea      Medication List    STOP taking these medications   benzonatate 100 MG capsule Commonly known as: Tessalon Perles   labetalol 100 MG tablet Commonly known as: NORMODYNE   traMADol 50 MG tablet Commonly known as: ULTRAM     TAKE these medications   acetaminophen 500 MG tablet Commonly known as: TYLENOL Take 500 mg by mouth daily as needed for mild pain.   ACIDOPHILUS PO Take 175 mg by mouth 2 (two) times daily.     atorvastatin 40 MG tablet Commonly known as: LIPITOR Take 1 tablet (40 mg total) by mouth daily at 6 PM.   B-complex with vitamin C tablet Place 1 tablet into feeding tube at bedtime.   citalopram 10 MG tablet Commonly known as: CELEXA Take 10 mg by mouth daily.   collagenase ointment Commonly known as: SANTYL Apply topically daily.   divalproex 125 MG capsule Commonly known as: DEPAKOTE SPRINKLE Take 125 mg by mouth 2 (two) times daily.   docusate sodium 100 MG capsule Commonly known as: COLACE Take 100 mg by mouth 2 (two) times daily.   donepezil 5 MG tablet Commonly known as: ARICEPT Take 5 mg by mouth at bedtime.   epoetin alfa 4000 UNIT/ML injection Commonly known as: EPOGEN Inject 1 mL (4,000 Units total) into the skin Every Tuesday,Thursday,and Saturday with dialysis.   feeding supplement (NEPRO CARB STEADY) Liqd Take 237 mLs by mouth daily. What changed: Another medication with the same name was added. Make sure you understand how and when to take each.   feeding supplement (JEVITY 1.5 CAL/FIBER) Liqd Place 1,000 mLs into feeding tube continuous. What changed: You were already taking a medication with the same name, and this prescription was added. Make sure you understand how and when to take each.   feeding supplement (PRO-STAT SUGAR FREE 64) Liqd Place 30 mLs into feeding tube 2 (two) times daily.   folic acid 268 MCG tablet Commonly known as: FOLVITE Take 400 mcg by mouth daily.   gabapentin 100 MG capsule Commonly known as: NEURONTIN Take 2 capsules (200 mg total) by mouth at bedtime.   Generlac 10 GM/15ML Soln Generic drug: lactulose (encephalopathy) Take 10 g by mouth daily.   levothyroxine 100 MCG tablet Commonly known as: SYNTHROID Take 100 mcg by mouth daily before breakfast.   meclizine 12.5 MG tablet Commonly known as: ANTIVERT Take 1 tablet (12.5 mg total) by mouth daily as needed for dizziness.   memantine 5 MG tablet Commonly  known as: NAMENDA Take 5 mg by mouth 2 (two) times daily.   midodrine 5 MG tablet Commonly  known as: PROAMATINE Place 1 tablet (5 mg total) into feeding tube 3 (three) times daily with meals.   risperiDONE 0.25 MG tablet Commonly known as: RISPERDAL Take 0.25 mg by mouth 2 (two) times daily.   sevelamer carbonate 800 MG tablet Commonly known as: RENVELA Take 1 tablet (800 mg total) by mouth 3 (three) times daily with meals.   sulfamethoxazole-trimethoprim 400-80 MG tablet Commonly known as: BACTRIM Place 1 tablet into feeding tube every 12 (twelve) hours.   zinc oxide 20 % ointment Apply 1 application topically in the morning, at noon, and at bedtime.      Contact information for after-discharge care    Destination    [facility] .   Service: Skilled Nursing Contact information: [address] [place] [phone]               Time coordinating discharge: 35 minutes  Signed:  Jennye Boroughs  [facility] 12/30/2019, 1:47 PM

## 2019-12-30 NOTE — TOC Progression Note (Signed)
Transition of Care [facility]) - Progression Note    Patient Details  Name: Taylor Tyler MRN: [id number] Date of Birth: 03-22-1941  [facility] of [facility]) CM/SW Contact  Brandin Stetzer, Gardiner Rhyme, LCSW Phone Number: 12/30/2019, 8:16 AM  Clinical Narrative:  [facility] have asked for additional clinicals, have faxed info. Hopeful to transfer pt back to [facility] today once determination made. COVID test negative. As long as medically stable work toward transfer         Expected Discharge Plan and [place] (SDOH) Interventions    Readmission Risk Interventions No flowsheet data found.

## 2019-12-30 NOTE — TOC Progression Note (Signed)
Transition of Care [facility]) - Progression Note    Patient Details  Name: Taylor Tyler MRN: [id number] Date of Birth: 03/03/1941  [facility] CM/SW Contact  Tahisha Hakim, Gardiner Rhyme, LCSW Phone Number: 12/30/2019, 10:58 AM  Clinical Narrative: Have received insurance auth for return to [facility]. Approved from 5/28-6/2 ref number [id number]. Awaiting MD to complete paperwork and beside RN aware to call report to [phone]. Will call son to inform on plan for today.           Expected Discharge Plan and Services                                                 Social Determinants of Health (SDOH) Interventions    Readmission Risk Interventions No flowsheet data found.

## 2019-12-30 NOTE — Progress Notes (Signed)
[facility]  ROUNDING NOTE   Subjective:   Feels fair. No acute events reported by nursing Continues to have large amount of edema.  Able to answer few yes/no questions Tube feeds continued via PEG tube  @ 50 cc/hr    Objective:  Vital signs in last 24 hours:  Temp:  [98.2 F (36.8 C)-99.6 F (37.6 C)] 98.2 F (36.8 C) (05/28 0826) Pulse Rate:  [90-107] 90 (05/28 0826) Resp:  [17-18] 17 (05/28 0826) BP: (132-137)/(63-76) 132/65 (05/28 0826) SpO2:  [98 %-100 %] 100 % (05/28 0826) Weight:  [88.5 kg] 88.5 kg (05/28 0500)  Weight change:  Filed Weights   12/27/19 1241 12/28/19 0432 12/30/19 0500  Weight: 89.4 kg 92.7 kg 88.5 kg    Intake/Output: I/O last 3 completed shifts: In: 7829 [Other:320; NG/GT:1100] Out: 801 [Stool:801]   Intake/Output this shift:  No intake/output data recorded.   Physical Exam: General:  No acute distress, chronically ill-appearing  HEENT  anicteric, moist oral mucous membrane  Pulm/lungs  normal breathing effort, lungs are clear to auscultation  CVS/Heart  no rub or gallop  Abdomen:   Soft, nontender, PEG tube in place  Extremities:  Dependent edema is present in arms and legs  Neurologic:  able to follow simple commands , left hemiparesis  Skin:  No acute rashes  Right IJ PermCath   Basic Metabolic Panel: Recent Labs  Lab 12/23/19 1319 12/23/19 1319 12/24/19 0424 12/25/19 0514 12/29/19 0630  NA 139  --  140 140 138  K 2.9*  --  2.9* 4.1 4.1  CL 101  --  103 103 99  CO2 29  --  29 28 31   GLUCOSE 204*  --  144* 85 153*  BUN 35*  --  44* 27* 45*  CREATININE 1.83*  --  1.97* 1.52* 1.93*  CALCIUM 7.9*   < > 7.9* 8.0* 7.8*  MG 1.5*  --  1.9 1.8  --   PHOS 1.6*  --  2.8 3.1 5.1*   < > = values in this interval not displayed.    Liver Function Tests: Recent Labs  Lab 12/29/19 0630  ALBUMIN 1.8*   No results for input(s): LIPASE, AMYLASE in the last 168 hours. No results for input(s): AMMONIA in the last 168  hours.  CBC: Recent Labs  Lab 12/23/19 1445 12/24/19 1745 12/29/19 0630  WBC  --   --  7.9  NEUTROABS  --   --  4.9  HGB 6.8* 10.1* 8.2*  HCT  --  34.4* 27.3*  MCV  --   --  98.6  PLT  --   --  166    Cardiac Enzymes: No results for input(s): CKTOTAL, CKMB, CKMBINDEX, TROPONINI in the last 168 hours.  BNP: Invalid input(s): POCBNP  CBG: Recent Labs  Lab 12/28/19 1654 12/28/19 2058 12/29/19 0754 12/29/19 2135 12/30/19 0825  GLUCAP 120* 110* 143* 119* 136*    Microbiology: Results for orders placed or performed during the hospital encounter of 12/11/19  Blood Culture (routine x 2)     Status: Abnormal   Collection Time: 12/11/19  7:45 PM   Specimen: BLOOD  Result Value Ref Range Status   Specimen Description   Final    BLOOD RIGHT FA Performed at [facility], [address] [place], [place] [address]    Special Requests   Final    BOTTLES DRAWN AEROBIC AND ANAEROBIC Blood Culture adequate volume Performed at [facility], [place],  [place], [place] [address]    Culture  Setup Time   Final    GRAM NEGATIVE RODS IN BOTH AEROBIC AND ANAEROBIC BOTTLES CRITICAL RESULT CALLED TO, READ BACK BY AND VERIFIED WITH: Rayna Sexton AT 9518 ON 12/12/19 SNG    Culture (A)  Final    PROTEUS MIRABILIS CITROBACTER YOUNGAE SUSCEPTIBILITIES PERFORMED ON PREVIOUS CULTURE WITHIN THE LAST 5 DAYS. Performed at [facility], [place] [address], [place], [place] [address]    Report Status 12/16/2019 FINAL  Final  Blood Culture (routine x 2)     Status: Abnormal   Collection Time: 12/11/19  7:45 PM   Specimen: BLOOD  Result Value Ref Range Status   Specimen Description   Final    BLOOD RIGHT ARM Performed at [facility], [address] [place], [place] [address]    Special Requests   Final    BOTTLES DRAWN AEROBIC AND ANAEROBIC Blood Culture adequate volume Performed at [facility], [address] [place], [place] [address]     Culture  Setup Time   Final    GRAM NEGATIVE RODS IN BOTH AEROBIC AND ANAEROBIC BOTTLES CRITICAL VALUE NOTED.  VALUE IS CONSISTENT WITH PREVIOUSLY REPORTED AND CALLED VALUE. Performed at [facility], [address] [place], [place] [address]    Culture PROTEUS MIRABILIS [place]  (A)  Final   Report Status 12/16/2019 FINAL  Final   Organism ID, Bacteria PROTEUS MIRABILIS  Final   Organism ID, Bacteria CITROBACTER YOUNGAE  Final      Susceptibility   Citrobacter youngae - MIC*    CEFAZOLIN >=64 RESISTANT Resistant     CEFEPIME <=1 SENSITIVE Sensitive     CEFTAZIDIME >=64 RESISTANT Resistant     CEFTRIAXONE >=64 RESISTANT Resistant     CIPROFLOXACIN <=0.25 SENSITIVE Sensitive     GENTAMICIN <=1 SENSITIVE Sensitive     IMIPENEM 1 SENSITIVE Sensitive     TRIMETH/SULFA <=20 SENSITIVE Sensitive     PIP/TAZO 64 INTERMEDIATE Intermediate     * CITROBACTER YOUNGAE   Proteus mirabilis - MIC*    AMPICILLIN <=2 SENSITIVE Sensitive     CEFAZOLIN <=4 SENSITIVE Sensitive     CEFEPIME <=1 SENSITIVE Sensitive     CEFTAZIDIME <=1 SENSITIVE Sensitive     CEFTRIAXONE <=1 SENSITIVE Sensitive     CIPROFLOXACIN <=0.25 SENSITIVE Sensitive     GENTAMICIN <=1 SENSITIVE Sensitive     IMIPENEM 4 SENSITIVE Sensitive     TRIMETH/SULFA <=20 SENSITIVE Sensitive     AMPICILLIN/SULBACTAM <=2 SENSITIVE Sensitive     PIP/TAZO <=4 SENSITIVE Sensitive     * PROTEUS MIRABILIS  Blood Culture ID Panel (Reflexed)     Status: Abnormal   Collection Time: 12/11/19  7:45 PM  Result Value Ref Range Status   Enterococcus species NOT DETECTED NOT DETECTED Final   Listeria monocytogenes NOT DETECTED NOT DETECTED Final   Staphylococcus species NOT DETECTED NOT DETECTED Final   Staphylococcus aureus (BCID) NOT DETECTED NOT DETECTED Final   Streptococcus species NOT DETECTED NOT DETECTED Final   Streptococcus agalactiae NOT DETECTED NOT DETECTED Final   Streptococcus pneumoniae NOT DETECTED NOT  DETECTED Final   Streptococcus pyogenes NOT DETECTED NOT DETECTED Final   Acinetobacter baumannii NOT DETECTED NOT DETECTED Final   Enterobacteriaceae species DETECTED (A) NOT DETECTED Final    Comment: Enterobacteriaceae represent a large family of gram-negative bacteria, not a single organism. CRITICAL RESULT CALLED TO, READ BACK BY AND VERIFIED WITH: Rayna Sexton AT [phone] ON  12/12/19 SNG    Enterobacter cloacae complex NOT DETECTED NOT DETECTED Final   Escherichia coli NOT DETECTED NOT DETECTED Final   Klebsiella oxytoca NOT DETECTED NOT DETECTED Final   Klebsiella pneumoniae NOT DETECTED NOT DETECTED Final   Proteus species DETECTED (A) NOT DETECTED Final    Comment: CRITICAL RESULT CALLED TO, READ BACK BY AND VERIFIED WITH: Rayna Sexton AT 9373 ON 12/12/19 SNG    Serratia marcescens NOT DETECTED NOT DETECTED Final   Carbapenem resistance NOT DETECTED NOT DETECTED Final   Haemophilus influenzae NOT DETECTED NOT DETECTED Final   Neisseria meningitidis NOT DETECTED NOT DETECTED Final   Pseudomonas aeruginosa NOT DETECTED NOT DETECTED Final   Candida albicans NOT DETECTED NOT DETECTED Final   Candida glabrata NOT DETECTED NOT DETECTED Final   Candida krusei NOT DETECTED NOT DETECTED Final   Candida parapsilosis NOT DETECTED NOT DETECTED Final   Candida tropicalis NOT DETECTED NOT DETECTED Final    Comment: Performed at [facility], [address], [place], [place] [address]  Urine culture     Status: Abnormal   Collection Time: 12/11/19  8:09 PM   Specimen: In/Out Cath Urine  Result Value Ref Range Status   Specimen Description   Final    IN/OUT CATH URINE Performed at [facility], [address] [place], [place] [address]    Special Requests   Final    NONE Performed at [facility], [address] [place], [place] [address]    Culture MULTIPLE SPECIES PRESENT, SUGGEST RECOLLECTION (A)  Final   Report Status 12/12/2019 FINAL  Final  Respiratory  Panel by RT PCR (Flu A&B, Covid) - Nasopharyngeal Swab     Status: None   Collection Time: 12/11/19  9:17 PM   Specimen: Nasopharyngeal Swab  Result Value Ref Range Status   SARS Coronavirus 2 by RT PCR NEGATIVE NEGATIVE Final    Comment: (NOTE) SARS-CoV-2 target nucleic acids are NOT DETECTED. The SARS-CoV-2 RNA is generally detectable in upper respiratoy specimens during the acute phase of infection. The lowest concentration of SARS-CoV-2 viral copies this assay can detect is 131 copies/mL. A negative result does not preclude SARS-Cov-2 infection and should not be used as the sole basis for treatment or other patient management decisions. A negative result may occur with  improper specimen collection/handling, submission of specimen other than nasopharyngeal swab, presence of viral mutation(s) within the areas targeted by this assay, and inadequate number of viral copies (<131 copies/mL). A negative result must be combined with clinical observations, patient history, and epidemiological information. The expected result is Negative. Fact Sheet for Patients:  PinkCheek.be Fact Sheet for Healthcare Providers:  GravelBags.it This test is not yet ap proved or cleared by the [place] FDA and  has been authorized for detection and/or diagnosis of SARS-CoV-2 by FDA under an Emergency Use Authorization (EUA). This EUA will remain  in effect (meaning this test can be used) for the duration of the COVID-19 declaration under Section 564(b)(1) of the Act, 21 U.S.C. section 360bbb-3(b)(1), unless the authorization is terminated or revoked sooner.    Influenza A by PCR NEGATIVE NEGATIVE Final   Influenza B by PCR NEGATIVE NEGATIVE Final    Comment: (NOTE) The Xpert Xpress SARS-CoV-2/FLU/RSV assay is intended as an aid in  the diagnosis of influenza from Nasopharyngeal swab specimens and  should not be used as a sole basis for  treatment. Nasal washings and  aspirates are unacceptable for Xpert Xpress SARS-CoV-2/FLU/RSV  testing. Fact  Sheet for Patients: PinkCheek.be Fact Sheet for Healthcare Providers: GravelBags.it This test is not yet approved or cleared by the [place] FDA and  has been authorized for detection and/or diagnosis of SARS-CoV-2 by  FDA under an Emergency Use Authorization (EUA). This EUA will remain  in effect (meaning this test can be used) for the duration of the  Covid-19 declaration under Section 564(b)(1) of the Act, 21  U.S.C. section 360bbb-3(b)(1), unless the authorization is  terminated or revoked. Performed at [facility], [address] [place], [place] [address]   MRSA PCR Screening     Status: None   Collection Time: 12/12/19 12:29 AM   Specimen: Nasopharyngeal  Result Value Ref Range Status   MRSA by PCR NEGATIVE NEGATIVE Final    Comment:        The GeneXpert MRSA Assay (FDA approved for NASAL specimens only), is one component of a comprehensive MRSA colonization surveillance program. It is not intended to diagnose MRSA infection nor to guide or monitor treatment for MRSA infections. Performed at [facility], [address] [place], [place] [address]   Aerobic/Anaerobic Culture (surgical/deep wound)     Status: None   Collection Time: 12/12/19  2:06 AM   Specimen: Sacral; Wound  Result Value Ref Range Status   Specimen Description   Final    SACRAL Performed at [facility], [address], [place], [place] [address]    Special Requests   Final    NONE Performed at [facility], [place], [place] [address]    Gram Stain NO WBC SEEN NO ORGANISMS SEEN   Final   Culture   Final    FEW ESCHERICHIA COLI FEW CITROBACTER SPECIES FEW PROTEUS MIRABILIS FEW BACTEROIDES OVATUS BETA LACTAMASE POSITIVE Performed at [facility], [place]  [address], [place], [place] [address]    Report Status 12/15/2019 FINAL  Final   Organism ID, Bacteria ESCHERICHIA COLI  Final   Organism ID, Bacteria CITROBACTER SPECIES  Final   Organism ID, Bacteria PROTEUS MIRABILIS  Final      Susceptibility   Citrobacter species - MIC*    CEFAZOLIN >=64 RESISTANT Resistant     CEFEPIME <=1 SENSITIVE Sensitive     CEFTAZIDIME >=64 RESISTANT Resistant     CEFTRIAXONE >=64 RESISTANT Resistant     CIPROFLOXACIN <=0.25 SENSITIVE Sensitive     GENTAMICIN <=1 SENSITIVE Sensitive     IMIPENEM <=0.25 SENSITIVE Sensitive     TRIMETH/SULFA <=20 SENSITIVE Sensitive     PIP/TAZO 64 INTERMEDIATE Intermediate     * FEW CITROBACTER SPECIES   Escherichia coli - MIC*    AMPICILLIN 16 INTERMEDIATE Intermediate     CEFAZOLIN >=64 RESISTANT Resistant     CEFEPIME <=1 SENSITIVE Sensitive     CEFTAZIDIME <=1 SENSITIVE Sensitive     CEFTRIAXONE <=1 SENSITIVE Sensitive     CIPROFLOXACIN <=0.25 SENSITIVE Sensitive     GENTAMICIN <=1 SENSITIVE Sensitive     IMIPENEM <=0.25 SENSITIVE Sensitive     TRIMETH/SULFA <=20 SENSITIVE Sensitive     AMPICILLIN/SULBACTAM 4 SENSITIVE Sensitive     PIP/TAZO <=4 SENSITIVE Sensitive     * FEW ESCHERICHIA COLI   Proteus mirabilis - MIC*    AMPICILLIN <=2 SENSITIVE Sensitive     CEFAZOLIN <=4 SENSITIVE Sensitive     CEFEPIME <=1 SENSITIVE Sensitive     CEFTAZIDIME <=1 SENSITIVE Sensitive     CEFTRIAXONE <=1 SENSITIVE Sensitive     CIPROFLOXACIN <=0.25  SENSITIVE Sensitive     GENTAMICIN 2 SENSITIVE Sensitive     IMIPENEM 4 SENSITIVE Sensitive     TRIMETH/SULFA <=20 SENSITIVE Sensitive     AMPICILLIN/SULBACTAM <=2 SENSITIVE Sensitive     PIP/TAZO <=4 SENSITIVE Sensitive     * FEW PROTEUS MIRABILIS  Aerobic/Anaerobic Culture (surgical/deep wound)     Status: None   Collection Time: 12/12/19  2:06 AM   Specimen: Back; Wound  Result Value Ref Range Status   Specimen Description   Final    BACK LUMBAR SPINE Performed at [facility], [address] [place], [place] [address]    Special Requests   Final    NONE Performed at [facility], [place], [place] [address]    Gram Stain NO WBC SEEN NO ORGANISMS SEEN   Final   Culture   Final    FEW PROTEUS MIRABILIS NO ANAEROBES ISOLATED Performed at [facility], [place] [address] [place], [place] [address]    Report Status 12/17/2019 FINAL  Final   Organism ID, Bacteria PROTEUS MIRABILIS  Final      Susceptibility   Proteus mirabilis - MIC*    AMPICILLIN <=2 SENSITIVE Sensitive     CEFAZOLIN <=4 SENSITIVE Sensitive     CEFEPIME <=1 SENSITIVE Sensitive     CEFTAZIDIME <=1 SENSITIVE Sensitive     CEFTRIAXONE <=1 SENSITIVE Sensitive     CIPROFLOXACIN <=0.25 SENSITIVE Sensitive     GENTAMICIN 2 SENSITIVE Sensitive     IMIPENEM 4 SENSITIVE Sensitive     TRIMETH/SULFA <=20 SENSITIVE Sensitive     AMPICILLIN/SULBACTAM <=2 SENSITIVE Sensitive     PIP/TAZO <=4 SENSITIVE Sensitive     * FEW PROTEUS MIRABILIS  Aerobic/Anaerobic Culture (surgical/deep wound)     Status: None   Collection Time: 12/19/19  3:08 PM   Specimen: Abscess  Result Value Ref Range Status   Specimen Description   Final    ABSCESS Performed at [facility], [address] [place], [place] [address]    Special Requests   Final    NONE Performed at [facility], [address] [place], [place] [address]    Gram Stain   Final    ABUNDANT WBC PRESENT, PREDOMINANTLY PMN RARE GRAM POSITIVE RODS RARE GRAM NEGATIVE RODS    Culture   Final    FEW ESCHERICHIA COLI MODERATE PROTEUS MIRABILIS NO ANAEROBES ISOLATED Performed at [facility], [place] [address] [place], [place] [address]    Report Status 12/24/2019 FINAL  Final   Organism ID, Bacteria ESCHERICHIA COLI  Final   Organism ID, Bacteria PROTEUS MIRABILIS  Final      Susceptibility   Escherichia coli - MIC*    AMPICILLIN >=32 RESISTANT Resistant     CEFAZOLIN >=64  RESISTANT Resistant     CEFEPIME <=1 SENSITIVE Sensitive     CEFTAZIDIME >=64 RESISTANT Resistant     CEFTRIAXONE >=64 RESISTANT Resistant     CIPROFLOXACIN <=0.25 SENSITIVE Sensitive     GENTAMICIN <=1 SENSITIVE Sensitive     IMIPENEM 1 SENSITIVE Sensitive     TRIMETH/SULFA <=20 SENSITIVE Sensitive     AMPICILLIN/SULBACTAM >=32 RESISTANT Resistant     PIP/TAZO 64 INTERMEDIATE Intermediate     * FEW ESCHERICHIA COLI   Proteus mirabilis - MIC*    AMPICILLIN >=32 RESISTANT Resistant     CEFAZOLIN >=64 RESISTANT Resistant     CEFEPIME RESISTANT Resistant     CEFTAZIDIME >=64 RESISTANT Resistant  CEFTRIAXONE >=64 RESISTANT Resistant     CIPROFLOXACIN <=0.25 SENSITIVE Sensitive     GENTAMICIN <=1 SENSITIVE Sensitive     IMIPENEM 4 SENSITIVE Sensitive     TRIMETH/SULFA <=20 SENSITIVE Sensitive     AMPICILLIN/SULBACTAM <=2 SENSITIVE Sensitive     PIP/TAZO <=4 SENSITIVE Sensitive     * MODERATE PROTEUS MIRABILIS  Culture, blood (Routine X 2) w Reflex to ID Panel     Status: None (Preliminary result)   Collection Time: 12/26/19  1:25 PM   Specimen: BLOOD  Result Value Ref Range Status   Specimen Description BLOOD BLOOD RIGHT WRIST  Final   Special Requests   Final    BOTTLES DRAWN AEROBIC ONLY Blood Culture results may not be optimal due to an inadequate volume of blood received in culture bottles   Culture   Final    NO GROWTH 4 DAYS Performed at [facility], [address] [place], [place] [address]    Report Status PENDING  Incomplete  Culture, blood (Routine X 2) w Reflex to ID Panel     Status: None (Preliminary result)   Collection Time: 12/26/19  3:48 PM   Specimen: BLOOD  Result Value Ref Range Status   Specimen Description BLOOD [facility]  Final   Special Requests   Final    BOTTLES DRAWN AEROBIC AND ANAEROBIC Blood Culture results may not be optimal due to an inadequate volume of blood received in culture bottles   Culture   Final    NO GROWTH 4 DAYS Performed  at [facility], [address] [place], [place] [address]    Report Status PENDING  Incomplete  SARS Coronavirus 2 by RT PCR (hospital order, performed in [facility] lab) Nasopharyngeal Nasopharyngeal Swab     Status: None   Collection Time: 12/28/19  4:41 PM   Specimen: Nasopharyngeal Swab  Result Value Ref Range Status   SARS Coronavirus 2 NEGATIVE NEGATIVE Final    Comment: (NOTE) SARS-CoV-2 target nucleic acids are NOT DETECTED. The SARS-CoV-2 RNA is generally detectable in upper and lower respiratory specimens during the acute phase of infection. The lowest concentration of SARS-CoV-2 viral copies this assay can detect is 250 copies / mL. A negative result does not preclude SARS-CoV-2 infection and should not be used as the sole basis for treatment or other patient management decisions.  A negative result may occur with improper specimen collection / handling, submission of specimen other than nasopharyngeal swab, presence of viral mutation(s) within the areas targeted by this assay, and inadequate number of viral copies (<250 copies / mL). A negative result must be combined with clinical observations, patient history, and epidemiological information. Fact Sheet for Patients:   StrictlyIdeas.no Fact Sheet for Healthcare Providers: BankingDealers.co.za This test is not yet approved or cleared  by the [place] FDA and has been authorized for detection and/or diagnosis of SARS-CoV-2 by FDA under an Emergency Use Authorization (EUA).  This EUA will remain in effect (meaning this test can be used) for the duration of the COVID-19 declaration under Section 564(b)(1) of the Act, 21 U.S.C. section 360bbb-3(b)(1), unless the authorization is terminated or revoked sooner. Performed at [facility], [address] [place], [place] [address]     Coagulation Studies: No results for input(s): LABPROT, INR  in the last 72 hours.  Urinalysis: No results for input(s): COLORURINE, LABSPEC, PHURINE, GLUCOSEU, HGBUR, BILIRUBINUR, KETONESUR, PROTEINUR, UROBILINOGEN, NITRITE, LEUKOCYTESUR in the last 72 hours.  Invalid input(s): APPERANCEUR  Imaging: No results found.   Medications:   . sodium chloride Stopped (12/25/19 1756)  . albumin human Stopped (12/22/19 1346)  . feeding supplement (JEVITY 1.5 CAL/FIBER) 1,000 mL (12/27/19 1723)   . atorvastatin  40 mg Oral q1800  . B-complex with vitamin C  1 tablet Per Tube QHS  . chlorhexidine  15 mL Mouth Rinse BID  . Chlorhexidine Gluconate Cloth  6 each Topical Q0600  . citalopram  10 mg Per Tube Daily  . donepezil  5 mg Per Tube QHS  . epoetin (EPOGEN/PROCRIT) injection  10,000 Units Intravenous Q T,Th,Sa-HD  . feeding supplement (PRO-STAT SUGAR FREE 64)  30 mL Per Tube BID  . folic acid  734 mcg Per Tube Daily  . heparin injection (subcutaneous)  5,000 Units Subcutaneous Q8H  . insulin aspart  0-5 Units Subcutaneous QHS  . insulin aspart  0-9 Units Subcutaneous TID WC  . levothyroxine  100 mcg Per Tube Q0600  . mouth rinse  15 mL Mouth Rinse q12n4p  . memantine  5 mg Per Tube BID  . midodrine  5 mg Per Tube TID WC  . pantoprazole sodium  40 mg Per Tube Daily  . phosphorus  500 mg Per Tube BID  . risperiDONE  0.25 mg Per Tube BID  . sevelamer carbonate  800 mg Oral TID WC  . sodium chloride flush  10-40 mL Intracatheter Q12H  . sulfamethoxazole-trimethoprim  1 tablet Per Tube Q12H   acetaminophen, docusate sodium, HYDROcodone-acetaminophen, ipratropium-albuterol, ondansetron (ZOFRAN) IV, polyethylene glycol, sodium chloride flush  Assessment/ Plan:   [name] is a 79 y.o. black female with end stage renal disease on hemodialysis, dementia, diabetes mellitus type II, hypertension, hypothyroidism, osteomyelitis, admitted to [facility] 12/11/2019 for Encephalopathy acute [G93.40] Urinary tract infection, acute  [N39.0] Osteomyelitis, unspecified site, unspecified type ([place]) [M86.9]  [facility] [address] TTS RIJ 79kg.  #.  ESRD on HD TTS:  With generalized edema Next dialysis to be scheduled for Saturday on her usual schedule Patient was able to sit for Dialysis in chair  Orders prepared.  UF goal about 3 L as tolerated.  #.  Anemia of chronic kidney disease.  Lab Results  Component Value Date   HGB 8.2 (L) 12/29/2019  - transfusion as per hospitalist.  - EPO with HD treatment.  - Appreciate GI input.   #  Secondary hyperparathyroidism: Lab Results  Component Value Date   PTH 68 (H) 12/13/2019   CALCIUM 7.8 (L) 12/29/2019   CAION 1.11 (L) 06/23/2019   PHOS 5.1 (H) 12/29/2019  Monitor calcium and phosphorus during this admission   #Sepsis with stage IV sacral decubitus wound infection with abscess and underlying osteomyelitis, Proteus and Citrobacter bacteremia Status post debridement of necrotic sacral wound Status post CT-guided aspiration on 12/19/2019 by VIR Dr Anselm Pancoast Now on Bactrim 400/80 q 12 hrs for growth of Proteus and E. coli Overall prognosis is very poor despite aggressive measures. Appreciate palliative care input.    LOS: [place] 5/28/20219:20 AM

## 2019-12-30 NOTE — Care Management Important Message (Signed)
Important Message  Patient Details  Name: GITEL BESTE MRN: [id number] Date of Birth: [birth year]   Medicare Important Message Given:  Yes     Dannette Barbara 12/30/2019, 12:21 PM

## 2019-12-31 DIAGNOSIS — N186 End stage renal disease: Secondary | ICD-10-CM | POA: Diagnosis not present

## 2019-12-31 DIAGNOSIS — D631 Anemia in chronic kidney disease: Secondary | ICD-10-CM | POA: Diagnosis not present

## 2019-12-31 DIAGNOSIS — N2581 Secondary hyperparathyroidism of renal origin: Secondary | ICD-10-CM | POA: Diagnosis not present

## 2019-12-31 DIAGNOSIS — Z992 Dependence on renal dialysis: Secondary | ICD-10-CM | POA: Diagnosis not present

## 2019-12-31 DIAGNOSIS — D689 Coagulation defect, unspecified: Secondary | ICD-10-CM | POA: Diagnosis not present

## 2019-12-31 DIAGNOSIS — D509 Iron deficiency anemia, unspecified: Secondary | ICD-10-CM | POA: Diagnosis not present

## 2019-12-31 LAB — CULTURE, BLOOD (ROUTINE X 2)
Culture: NO GROWTH
Culture: NO GROWTH

## 2020-01-01 LAB — GLUCOSE, CAPILLARY: Glucose-Capillary: 90 mg/dL (ref 70–99)

## 2020-01-03 DIAGNOSIS — D631 Anemia in chronic kidney disease: Secondary | ICD-10-CM | POA: Diagnosis not present

## 2020-01-03 DIAGNOSIS — A419 Sepsis, unspecified organism: Secondary | ICD-10-CM | POA: Diagnosis not present

## 2020-01-03 DIAGNOSIS — N186 End stage renal disease: Secondary | ICD-10-CM | POA: Diagnosis not present

## 2020-01-03 DIAGNOSIS — Z992 Dependence on renal dialysis: Secondary | ICD-10-CM | POA: Diagnosis not present

## 2020-01-03 DIAGNOSIS — Z23 Encounter for immunization: Secondary | ICD-10-CM | POA: Diagnosis not present

## 2020-01-03 DIAGNOSIS — D689 Coagulation defect, unspecified: Secondary | ICD-10-CM | POA: Diagnosis not present

## 2020-01-03 DIAGNOSIS — D509 Iron deficiency anemia, unspecified: Secondary | ICD-10-CM | POA: Diagnosis not present

## 2020-01-03 DIAGNOSIS — M4628 Osteomyelitis of vertebra, sacral and sacrococcygeal region: Secondary | ICD-10-CM | POA: Diagnosis not present

## 2020-01-03 DIAGNOSIS — R5381 Other malaise: Secondary | ICD-10-CM | POA: Diagnosis not present

## 2020-01-03 DIAGNOSIS — E1122 Type 2 diabetes mellitus with diabetic chronic kidney disease: Secondary | ICD-10-CM | POA: Diagnosis not present

## 2020-01-03 DIAGNOSIS — I5032 Chronic diastolic (congestive) heart failure: Secondary | ICD-10-CM | POA: Diagnosis not present

## 2020-01-03 DIAGNOSIS — N2581 Secondary hyperparathyroidism of renal origin: Secondary | ICD-10-CM | POA: Diagnosis not present

## 2020-01-05 DIAGNOSIS — E1122 Type 2 diabetes mellitus with diabetic chronic kidney disease: Secondary | ICD-10-CM | POA: Diagnosis not present

## 2020-01-05 DIAGNOSIS — D631 Anemia in chronic kidney disease: Secondary | ICD-10-CM | POA: Diagnosis not present

## 2020-01-05 DIAGNOSIS — D509 Iron deficiency anemia, unspecified: Secondary | ICD-10-CM | POA: Diagnosis not present

## 2020-01-05 DIAGNOSIS — N186 End stage renal disease: Secondary | ICD-10-CM | POA: Diagnosis not present

## 2020-01-05 DIAGNOSIS — N2581 Secondary hyperparathyroidism of renal origin: Secondary | ICD-10-CM | POA: Diagnosis not present

## 2020-01-05 DIAGNOSIS — Z23 Encounter for immunization: Secondary | ICD-10-CM | POA: Diagnosis not present

## 2020-01-05 DIAGNOSIS — D689 Coagulation defect, unspecified: Secondary | ICD-10-CM | POA: Diagnosis not present

## 2020-01-05 DIAGNOSIS — Z992 Dependence on renal dialysis: Secondary | ICD-10-CM | POA: Diagnosis not present

## 2020-01-06 ENCOUNTER — Encounter: Payer: Self-pay | Admitting: Internal Medicine

## 2020-01-07 DIAGNOSIS — D689 Coagulation defect, unspecified: Secondary | ICD-10-CM | POA: Diagnosis not present

## 2020-01-07 DIAGNOSIS — N2581 Secondary hyperparathyroidism of renal origin: Secondary | ICD-10-CM | POA: Diagnosis not present

## 2020-01-07 DIAGNOSIS — Z23 Encounter for immunization: Secondary | ICD-10-CM | POA: Diagnosis not present

## 2020-01-07 DIAGNOSIS — N186 End stage renal disease: Secondary | ICD-10-CM | POA: Diagnosis not present

## 2020-01-07 DIAGNOSIS — D631 Anemia in chronic kidney disease: Secondary | ICD-10-CM | POA: Diagnosis not present

## 2020-01-07 DIAGNOSIS — D509 Iron deficiency anemia, unspecified: Secondary | ICD-10-CM | POA: Diagnosis not present

## 2020-01-07 DIAGNOSIS — E1122 Type 2 diabetes mellitus with diabetic chronic kidney disease: Secondary | ICD-10-CM | POA: Diagnosis not present

## 2020-01-07 DIAGNOSIS — Z992 Dependence on renal dialysis: Secondary | ICD-10-CM | POA: Diagnosis not present

## 2020-01-10 DIAGNOSIS — D631 Anemia in chronic kidney disease: Secondary | ICD-10-CM | POA: Diagnosis not present

## 2020-01-10 DIAGNOSIS — E1122 Type 2 diabetes mellitus with diabetic chronic kidney disease: Secondary | ICD-10-CM | POA: Diagnosis not present

## 2020-01-10 DIAGNOSIS — D689 Coagulation defect, unspecified: Secondary | ICD-10-CM | POA: Diagnosis not present

## 2020-01-10 DIAGNOSIS — Z992 Dependence on renal dialysis: Secondary | ICD-10-CM | POA: Diagnosis not present

## 2020-01-10 DIAGNOSIS — N2581 Secondary hyperparathyroidism of renal origin: Secondary | ICD-10-CM | POA: Diagnosis not present

## 2020-01-10 DIAGNOSIS — N186 End stage renal disease: Secondary | ICD-10-CM | POA: Diagnosis not present

## 2020-01-10 DIAGNOSIS — Z23 Encounter for immunization: Secondary | ICD-10-CM | POA: Diagnosis not present

## 2020-01-10 DIAGNOSIS — D509 Iron deficiency anemia, unspecified: Secondary | ICD-10-CM | POA: Diagnosis not present

## 2020-01-12 DIAGNOSIS — Z992 Dependence on renal dialysis: Secondary | ICD-10-CM | POA: Diagnosis not present

## 2020-01-12 DIAGNOSIS — D689 Coagulation defect, unspecified: Secondary | ICD-10-CM | POA: Diagnosis not present

## 2020-01-12 DIAGNOSIS — N2581 Secondary hyperparathyroidism of renal origin: Secondary | ICD-10-CM | POA: Diagnosis not present

## 2020-01-12 DIAGNOSIS — D509 Iron deficiency anemia, unspecified: Secondary | ICD-10-CM | POA: Diagnosis not present

## 2020-01-12 DIAGNOSIS — E1122 Type 2 diabetes mellitus with diabetic chronic kidney disease: Secondary | ICD-10-CM | POA: Diagnosis not present

## 2020-01-12 DIAGNOSIS — D631 Anemia in chronic kidney disease: Secondary | ICD-10-CM | POA: Diagnosis not present

## 2020-01-12 DIAGNOSIS — Z23 Encounter for immunization: Secondary | ICD-10-CM | POA: Diagnosis not present

## 2020-01-12 DIAGNOSIS — N186 End stage renal disease: Secondary | ICD-10-CM | POA: Diagnosis not present

## 2020-01-13 DIAGNOSIS — N186 End stage renal disease: Secondary | ICD-10-CM | POA: Diagnosis not present

## 2020-01-13 DIAGNOSIS — Z992 Dependence on renal dialysis: Secondary | ICD-10-CM | POA: Diagnosis not present

## 2020-01-13 DIAGNOSIS — E8779 Other fluid overload: Secondary | ICD-10-CM | POA: Diagnosis not present

## 2020-01-13 DIAGNOSIS — N2581 Secondary hyperparathyroidism of renal origin: Secondary | ICD-10-CM | POA: Diagnosis not present

## 2020-01-14 DIAGNOSIS — N186 End stage renal disease: Secondary | ICD-10-CM | POA: Diagnosis not present

## 2020-01-14 DIAGNOSIS — D509 Iron deficiency anemia, unspecified: Secondary | ICD-10-CM | POA: Diagnosis not present

## 2020-01-14 DIAGNOSIS — E1122 Type 2 diabetes mellitus with diabetic chronic kidney disease: Secondary | ICD-10-CM | POA: Diagnosis not present

## 2020-01-14 DIAGNOSIS — Z23 Encounter for immunization: Secondary | ICD-10-CM | POA: Diagnosis not present

## 2020-01-14 DIAGNOSIS — D631 Anemia in chronic kidney disease: Secondary | ICD-10-CM | POA: Diagnosis not present

## 2020-01-14 DIAGNOSIS — D689 Coagulation defect, unspecified: Secondary | ICD-10-CM | POA: Diagnosis not present

## 2020-01-14 DIAGNOSIS — Z992 Dependence on renal dialysis: Secondary | ICD-10-CM | POA: Diagnosis not present

## 2020-01-14 DIAGNOSIS — N2581 Secondary hyperparathyroidism of renal origin: Secondary | ICD-10-CM | POA: Diagnosis not present

## 2020-01-17 DIAGNOSIS — D689 Coagulation defect, unspecified: Secondary | ICD-10-CM | POA: Diagnosis not present

## 2020-01-17 DIAGNOSIS — Z23 Encounter for immunization: Secondary | ICD-10-CM | POA: Diagnosis not present

## 2020-01-17 DIAGNOSIS — N186 End stage renal disease: Secondary | ICD-10-CM | POA: Diagnosis not present

## 2020-01-17 DIAGNOSIS — Z992 Dependence on renal dialysis: Secondary | ICD-10-CM | POA: Diagnosis not present

## 2020-01-17 DIAGNOSIS — D631 Anemia in chronic kidney disease: Secondary | ICD-10-CM | POA: Diagnosis not present

## 2020-01-17 DIAGNOSIS — E1122 Type 2 diabetes mellitus with diabetic chronic kidney disease: Secondary | ICD-10-CM | POA: Diagnosis not present

## 2020-01-17 DIAGNOSIS — N2581 Secondary hyperparathyroidism of renal origin: Secondary | ICD-10-CM | POA: Diagnosis not present

## 2020-01-17 DIAGNOSIS — D509 Iron deficiency anemia, unspecified: Secondary | ICD-10-CM | POA: Diagnosis not present

## 2020-01-18 DIAGNOSIS — N186 End stage renal disease: Secondary | ICD-10-CM | POA: Diagnosis not present

## 2020-01-18 DIAGNOSIS — Z992 Dependence on renal dialysis: Secondary | ICD-10-CM | POA: Diagnosis not present

## 2020-01-19 ENCOUNTER — Telehealth: Payer: Self-pay | Admitting: Internal Medicine

## 2020-01-19 DIAGNOSIS — D689 Coagulation defect, unspecified: Secondary | ICD-10-CM | POA: Diagnosis not present

## 2020-01-19 DIAGNOSIS — N2581 Secondary hyperparathyroidism of renal origin: Secondary | ICD-10-CM | POA: Diagnosis not present

## 2020-01-19 DIAGNOSIS — Z992 Dependence on renal dialysis: Secondary | ICD-10-CM | POA: Diagnosis not present

## 2020-01-19 DIAGNOSIS — E1122 Type 2 diabetes mellitus with diabetic chronic kidney disease: Secondary | ICD-10-CM | POA: Diagnosis not present

## 2020-01-19 DIAGNOSIS — D509 Iron deficiency anemia, unspecified: Secondary | ICD-10-CM | POA: Diagnosis not present

## 2020-01-19 DIAGNOSIS — N186 End stage renal disease: Secondary | ICD-10-CM | POA: Diagnosis not present

## 2020-01-19 DIAGNOSIS — Z23 Encounter for immunization: Secondary | ICD-10-CM | POA: Diagnosis not present

## 2020-01-19 DIAGNOSIS — D631 Anemia in chronic kidney disease: Secondary | ICD-10-CM | POA: Diagnosis not present

## 2020-01-19 NOTE — Telephone Encounter (Signed)
Left message for patient to call back and schedule Medicare Annual Wellness Visit (AWV) either virtually or audio only.  Last AWV 12/07/18; please schedule at anytime with Denisa O'Brien-Blaney at [facility].

## 2020-01-21 DIAGNOSIS — Z992 Dependence on renal dialysis: Secondary | ICD-10-CM | POA: Diagnosis not present

## 2020-01-21 DIAGNOSIS — Z23 Encounter for immunization: Secondary | ICD-10-CM | POA: Diagnosis not present

## 2020-01-21 DIAGNOSIS — D509 Iron deficiency anemia, unspecified: Secondary | ICD-10-CM | POA: Diagnosis not present

## 2020-01-21 DIAGNOSIS — E1122 Type 2 diabetes mellitus with diabetic chronic kidney disease: Secondary | ICD-10-CM | POA: Diagnosis not present

## 2020-01-21 DIAGNOSIS — D631 Anemia in chronic kidney disease: Secondary | ICD-10-CM | POA: Diagnosis not present

## 2020-01-21 DIAGNOSIS — N2581 Secondary hyperparathyroidism of renal origin: Secondary | ICD-10-CM | POA: Diagnosis not present

## 2020-01-21 DIAGNOSIS — N186 End stage renal disease: Secondary | ICD-10-CM | POA: Diagnosis not present

## 2020-01-21 DIAGNOSIS — D689 Coagulation defect, unspecified: Secondary | ICD-10-CM | POA: Diagnosis not present

## 2020-01-23 DIAGNOSIS — R1312 Dysphagia, oropharyngeal phase: Secondary | ICD-10-CM | POA: Diagnosis not present

## 2020-01-24 DIAGNOSIS — D509 Iron deficiency anemia, unspecified: Secondary | ICD-10-CM | POA: Diagnosis not present

## 2020-01-24 DIAGNOSIS — D631 Anemia in chronic kidney disease: Secondary | ICD-10-CM | POA: Diagnosis not present

## 2020-01-24 DIAGNOSIS — E1122 Type 2 diabetes mellitus with diabetic chronic kidney disease: Secondary | ICD-10-CM | POA: Diagnosis not present

## 2020-01-24 DIAGNOSIS — N186 End stage renal disease: Secondary | ICD-10-CM | POA: Diagnosis not present

## 2020-01-24 DIAGNOSIS — N2581 Secondary hyperparathyroidism of renal origin: Secondary | ICD-10-CM | POA: Diagnosis not present

## 2020-01-24 DIAGNOSIS — D689 Coagulation defect, unspecified: Secondary | ICD-10-CM | POA: Diagnosis not present

## 2020-01-24 DIAGNOSIS — Z992 Dependence on renal dialysis: Secondary | ICD-10-CM | POA: Diagnosis not present

## 2020-01-24 DIAGNOSIS — Z23 Encounter for immunization: Secondary | ICD-10-CM | POA: Diagnosis not present

## 2020-01-25 DIAGNOSIS — R1312 Dysphagia, oropharyngeal phase: Secondary | ICD-10-CM | POA: Diagnosis not present

## 2020-01-26 DIAGNOSIS — Z23 Encounter for immunization: Secondary | ICD-10-CM | POA: Diagnosis not present

## 2020-01-26 DIAGNOSIS — N186 End stage renal disease: Secondary | ICD-10-CM | POA: Diagnosis not present

## 2020-01-26 DIAGNOSIS — D509 Iron deficiency anemia, unspecified: Secondary | ICD-10-CM | POA: Diagnosis not present

## 2020-01-26 DIAGNOSIS — T8189XD Other complications of procedures, not elsewhere classified, subsequent encounter: Secondary | ICD-10-CM | POA: Diagnosis not present

## 2020-01-26 DIAGNOSIS — Z992 Dependence on renal dialysis: Secondary | ICD-10-CM | POA: Diagnosis not present

## 2020-01-26 DIAGNOSIS — D689 Coagulation defect, unspecified: Secondary | ICD-10-CM | POA: Diagnosis not present

## 2020-01-26 DIAGNOSIS — D631 Anemia in chronic kidney disease: Secondary | ICD-10-CM | POA: Diagnosis not present

## 2020-01-26 DIAGNOSIS — R1312 Dysphagia, oropharyngeal phase: Secondary | ICD-10-CM | POA: Diagnosis not present

## 2020-01-26 DIAGNOSIS — N2581 Secondary hyperparathyroidism of renal origin: Secondary | ICD-10-CM | POA: Diagnosis not present

## 2020-01-26 DIAGNOSIS — E1122 Type 2 diabetes mellitus with diabetic chronic kidney disease: Secondary | ICD-10-CM | POA: Diagnosis not present

## 2020-01-26 DIAGNOSIS — L89150 Pressure ulcer of sacral region, unstageable: Secondary | ICD-10-CM | POA: Diagnosis not present

## 2020-01-27 ENCOUNTER — Telehealth: Payer: Self-pay | Admitting: Oncology

## 2020-01-27 ENCOUNTER — Inpatient Hospital Stay: Payer: Medicare Other | Admitting: Oncology

## 2020-01-27 DIAGNOSIS — R1312 Dysphagia, oropharyngeal phase: Secondary | ICD-10-CM | POA: Diagnosis not present

## 2020-01-27 DIAGNOSIS — R1111 Vomiting without nausea: Secondary | ICD-10-CM | POA: Diagnosis not present

## 2020-01-27 DIAGNOSIS — R1084 Generalized abdominal pain: Secondary | ICD-10-CM | POA: Diagnosis not present

## 2020-01-27 NOTE — Telephone Encounter (Signed)
Vaughan Basta from [facility] phoned on this date and cancelled patient's appt with [facility] stating that patient was sick and throwing up. Vaughan Basta stated that the facility will phone back at a later date and reschedule.

## 2020-01-28 DIAGNOSIS — D509 Iron deficiency anemia, unspecified: Secondary | ICD-10-CM | POA: Diagnosis not present

## 2020-01-28 DIAGNOSIS — D689 Coagulation defect, unspecified: Secondary | ICD-10-CM | POA: Diagnosis not present

## 2020-01-28 DIAGNOSIS — Z992 Dependence on renal dialysis: Secondary | ICD-10-CM | POA: Diagnosis not present

## 2020-01-28 DIAGNOSIS — E1122 Type 2 diabetes mellitus with diabetic chronic kidney disease: Secondary | ICD-10-CM | POA: Diagnosis not present

## 2020-01-28 DIAGNOSIS — N186 End stage renal disease: Secondary | ICD-10-CM | POA: Diagnosis not present

## 2020-01-28 DIAGNOSIS — D631 Anemia in chronic kidney disease: Secondary | ICD-10-CM | POA: Diagnosis not present

## 2020-01-28 DIAGNOSIS — Z23 Encounter for immunization: Secondary | ICD-10-CM | POA: Diagnosis not present

## 2020-01-28 DIAGNOSIS — N2581 Secondary hyperparathyroidism of renal origin: Secondary | ICD-10-CM | POA: Diagnosis not present

## 2020-01-28 DIAGNOSIS — R109 Unspecified abdominal pain: Secondary | ICD-10-CM | POA: Diagnosis not present

## 2020-01-30 DIAGNOSIS — R1312 Dysphagia, oropharyngeal phase: Secondary | ICD-10-CM | POA: Diagnosis not present

## 2020-01-31 DIAGNOSIS — R1312 Dysphagia, oropharyngeal phase: Secondary | ICD-10-CM | POA: Diagnosis not present

## 2020-01-31 DIAGNOSIS — Z992 Dependence on renal dialysis: Secondary | ICD-10-CM | POA: Diagnosis not present

## 2020-01-31 DIAGNOSIS — Z23 Encounter for immunization: Secondary | ICD-10-CM | POA: Diagnosis not present

## 2020-01-31 DIAGNOSIS — E1122 Type 2 diabetes mellitus with diabetic chronic kidney disease: Secondary | ICD-10-CM | POA: Diagnosis not present

## 2020-01-31 DIAGNOSIS — D689 Coagulation defect, unspecified: Secondary | ICD-10-CM | POA: Diagnosis not present

## 2020-01-31 DIAGNOSIS — M4628 Osteomyelitis of vertebra, sacral and sacrococcygeal region: Secondary | ICD-10-CM | POA: Diagnosis not present

## 2020-01-31 DIAGNOSIS — N2581 Secondary hyperparathyroidism of renal origin: Secondary | ICD-10-CM | POA: Diagnosis not present

## 2020-01-31 DIAGNOSIS — K5909 Other constipation: Secondary | ICD-10-CM | POA: Diagnosis not present

## 2020-01-31 DIAGNOSIS — D631 Anemia in chronic kidney disease: Secondary | ICD-10-CM | POA: Diagnosis not present

## 2020-01-31 DIAGNOSIS — D509 Iron deficiency anemia, unspecified: Secondary | ICD-10-CM | POA: Diagnosis not present

## 2020-01-31 DIAGNOSIS — N186 End stage renal disease: Secondary | ICD-10-CM | POA: Diagnosis not present

## 2020-02-01 DIAGNOSIS — R1312 Dysphagia, oropharyngeal phase: Secondary | ICD-10-CM | POA: Diagnosis not present

## 2020-02-01 DIAGNOSIS — E1122 Type 2 diabetes mellitus with diabetic chronic kidney disease: Secondary | ICD-10-CM | POA: Diagnosis not present

## 2020-02-01 DIAGNOSIS — Z992 Dependence on renal dialysis: Secondary | ICD-10-CM | POA: Diagnosis not present

## 2020-02-01 DIAGNOSIS — N186 End stage renal disease: Secondary | ICD-10-CM | POA: Diagnosis not present

## 2020-02-02 DIAGNOSIS — M6281 Muscle weakness (generalized): Secondary | ICD-10-CM | POA: Diagnosis not present

## 2020-02-02 DIAGNOSIS — D689 Coagulation defect, unspecified: Secondary | ICD-10-CM | POA: Diagnosis not present

## 2020-02-02 DIAGNOSIS — R1312 Dysphagia, oropharyngeal phase: Secondary | ICD-10-CM | POA: Diagnosis not present

## 2020-02-02 DIAGNOSIS — D509 Iron deficiency anemia, unspecified: Secondary | ICD-10-CM | POA: Diagnosis not present

## 2020-02-02 DIAGNOSIS — N186 End stage renal disease: Secondary | ICD-10-CM | POA: Diagnosis not present

## 2020-02-02 DIAGNOSIS — Z992 Dependence on renal dialysis: Secondary | ICD-10-CM | POA: Diagnosis not present

## 2020-02-02 DIAGNOSIS — M4628 Osteomyelitis of vertebra, sacral and sacrococcygeal region: Secondary | ICD-10-CM | POA: Diagnosis not present

## 2020-02-02 DIAGNOSIS — Z23 Encounter for immunization: Secondary | ICD-10-CM | POA: Diagnosis not present

## 2020-02-02 DIAGNOSIS — G252 Other specified forms of tremor: Secondary | ICD-10-CM | POA: Diagnosis not present

## 2020-02-02 DIAGNOSIS — D631 Anemia in chronic kidney disease: Secondary | ICD-10-CM | POA: Diagnosis not present

## 2020-02-02 DIAGNOSIS — N2581 Secondary hyperparathyroidism of renal origin: Secondary | ICD-10-CM | POA: Diagnosis not present

## 2020-02-03 DIAGNOSIS — R1312 Dysphagia, oropharyngeal phase: Secondary | ICD-10-CM | POA: Diagnosis not present

## 2020-02-03 DIAGNOSIS — M4628 Osteomyelitis of vertebra, sacral and sacrococcygeal region: Secondary | ICD-10-CM | POA: Diagnosis not present

## 2020-02-03 DIAGNOSIS — Z992 Dependence on renal dialysis: Secondary | ICD-10-CM | POA: Diagnosis not present

## 2020-02-03 DIAGNOSIS — N186 End stage renal disease: Secondary | ICD-10-CM | POA: Diagnosis not present

## 2020-02-03 DIAGNOSIS — M6281 Muscle weakness (generalized): Secondary | ICD-10-CM | POA: Diagnosis not present

## 2020-02-04 DIAGNOSIS — N186 End stage renal disease: Secondary | ICD-10-CM | POA: Diagnosis not present

## 2020-02-04 DIAGNOSIS — D631 Anemia in chronic kidney disease: Secondary | ICD-10-CM | POA: Diagnosis not present

## 2020-02-04 DIAGNOSIS — Z992 Dependence on renal dialysis: Secondary | ICD-10-CM | POA: Diagnosis not present

## 2020-02-04 DIAGNOSIS — Z23 Encounter for immunization: Secondary | ICD-10-CM | POA: Diagnosis not present

## 2020-02-04 DIAGNOSIS — D689 Coagulation defect, unspecified: Secondary | ICD-10-CM | POA: Diagnosis not present

## 2020-02-04 DIAGNOSIS — D509 Iron deficiency anemia, unspecified: Secondary | ICD-10-CM | POA: Diagnosis not present

## 2020-02-04 DIAGNOSIS — N2581 Secondary hyperparathyroidism of renal origin: Secondary | ICD-10-CM | POA: Diagnosis not present

## 2020-02-06 DIAGNOSIS — R1312 Dysphagia, oropharyngeal phase: Secondary | ICD-10-CM | POA: Diagnosis not present

## 2020-02-06 DIAGNOSIS — M6281 Muscle weakness (generalized): Secondary | ICD-10-CM | POA: Diagnosis not present

## 2020-02-07 DIAGNOSIS — N2581 Secondary hyperparathyroidism of renal origin: Secondary | ICD-10-CM | POA: Diagnosis not present

## 2020-02-07 DIAGNOSIS — D689 Coagulation defect, unspecified: Secondary | ICD-10-CM | POA: Diagnosis not present

## 2020-02-07 DIAGNOSIS — N186 End stage renal disease: Secondary | ICD-10-CM | POA: Diagnosis not present

## 2020-02-07 DIAGNOSIS — D631 Anemia in chronic kidney disease: Secondary | ICD-10-CM | POA: Diagnosis not present

## 2020-02-07 DIAGNOSIS — D509 Iron deficiency anemia, unspecified: Secondary | ICD-10-CM | POA: Diagnosis not present

## 2020-02-07 DIAGNOSIS — Z992 Dependence on renal dialysis: Secondary | ICD-10-CM | POA: Diagnosis not present

## 2020-02-07 DIAGNOSIS — Z23 Encounter for immunization: Secondary | ICD-10-CM | POA: Diagnosis not present

## 2020-02-08 DIAGNOSIS — M4628 Osteomyelitis of vertebra, sacral and sacrococcygeal region: Secondary | ICD-10-CM | POA: Diagnosis not present

## 2020-02-08 DIAGNOSIS — R1312 Dysphagia, oropharyngeal phase: Secondary | ICD-10-CM | POA: Diagnosis not present

## 2020-02-08 DIAGNOSIS — N186 End stage renal disease: Secondary | ICD-10-CM | POA: Diagnosis not present

## 2020-02-08 DIAGNOSIS — Z992 Dependence on renal dialysis: Secondary | ICD-10-CM | POA: Diagnosis not present

## 2020-02-08 DIAGNOSIS — M6281 Muscle weakness (generalized): Secondary | ICD-10-CM | POA: Diagnosis not present

## 2020-02-09 DIAGNOSIS — D631 Anemia in chronic kidney disease: Secondary | ICD-10-CM | POA: Diagnosis not present

## 2020-02-09 DIAGNOSIS — Z23 Encounter for immunization: Secondary | ICD-10-CM | POA: Diagnosis not present

## 2020-02-09 DIAGNOSIS — M6281 Muscle weakness (generalized): Secondary | ICD-10-CM | POA: Diagnosis not present

## 2020-02-09 DIAGNOSIS — D689 Coagulation defect, unspecified: Secondary | ICD-10-CM | POA: Diagnosis not present

## 2020-02-09 DIAGNOSIS — N2581 Secondary hyperparathyroidism of renal origin: Secondary | ICD-10-CM | POA: Diagnosis not present

## 2020-02-09 DIAGNOSIS — R1312 Dysphagia, oropharyngeal phase: Secondary | ICD-10-CM | POA: Diagnosis not present

## 2020-02-09 DIAGNOSIS — Z992 Dependence on renal dialysis: Secondary | ICD-10-CM | POA: Diagnosis not present

## 2020-02-09 DIAGNOSIS — D509 Iron deficiency anemia, unspecified: Secondary | ICD-10-CM | POA: Diagnosis not present

## 2020-02-09 DIAGNOSIS — N186 End stage renal disease: Secondary | ICD-10-CM | POA: Diagnosis not present

## 2020-02-10 DIAGNOSIS — R1312 Dysphagia, oropharyngeal phase: Secondary | ICD-10-CM | POA: Diagnosis not present

## 2020-02-10 DIAGNOSIS — M6281 Muscle weakness (generalized): Secondary | ICD-10-CM | POA: Diagnosis not present

## 2020-02-11 DIAGNOSIS — D689 Coagulation defect, unspecified: Secondary | ICD-10-CM | POA: Diagnosis not present

## 2020-02-11 DIAGNOSIS — D631 Anemia in chronic kidney disease: Secondary | ICD-10-CM | POA: Diagnosis not present

## 2020-02-11 DIAGNOSIS — N2581 Secondary hyperparathyroidism of renal origin: Secondary | ICD-10-CM | POA: Diagnosis not present

## 2020-02-11 DIAGNOSIS — Z992 Dependence on renal dialysis: Secondary | ICD-10-CM | POA: Diagnosis not present

## 2020-02-11 DIAGNOSIS — Z23 Encounter for immunization: Secondary | ICD-10-CM | POA: Diagnosis not present

## 2020-02-11 DIAGNOSIS — N186 End stage renal disease: Secondary | ICD-10-CM | POA: Diagnosis not present

## 2020-02-11 DIAGNOSIS — D509 Iron deficiency anemia, unspecified: Secondary | ICD-10-CM | POA: Diagnosis not present

## 2020-02-13 DIAGNOSIS — R1312 Dysphagia, oropharyngeal phase: Secondary | ICD-10-CM | POA: Diagnosis not present

## 2020-02-13 DIAGNOSIS — M6281 Muscle weakness (generalized): Secondary | ICD-10-CM | POA: Diagnosis not present

## 2020-02-13 DIAGNOSIS — Z03818 Encounter for observation for suspected exposure to other biological agents ruled out: Secondary | ICD-10-CM | POA: Diagnosis not present

## 2020-02-14 DIAGNOSIS — Z23 Encounter for immunization: Secondary | ICD-10-CM | POA: Diagnosis not present

## 2020-02-14 DIAGNOSIS — Z992 Dependence on renal dialysis: Secondary | ICD-10-CM | POA: Diagnosis not present

## 2020-02-14 DIAGNOSIS — D509 Iron deficiency anemia, unspecified: Secondary | ICD-10-CM | POA: Diagnosis not present

## 2020-02-14 DIAGNOSIS — D631 Anemia in chronic kidney disease: Secondary | ICD-10-CM | POA: Diagnosis not present

## 2020-02-14 DIAGNOSIS — M6281 Muscle weakness (generalized): Secondary | ICD-10-CM | POA: Diagnosis not present

## 2020-02-14 DIAGNOSIS — N186 End stage renal disease: Secondary | ICD-10-CM | POA: Diagnosis not present

## 2020-02-14 DIAGNOSIS — N2581 Secondary hyperparathyroidism of renal origin: Secondary | ICD-10-CM | POA: Diagnosis not present

## 2020-02-14 DIAGNOSIS — R1312 Dysphagia, oropharyngeal phase: Secondary | ICD-10-CM | POA: Diagnosis not present

## 2020-02-14 DIAGNOSIS — D689 Coagulation defect, unspecified: Secondary | ICD-10-CM | POA: Diagnosis not present

## 2020-02-15 DIAGNOSIS — M6281 Muscle weakness (generalized): Secondary | ICD-10-CM | POA: Diagnosis not present

## 2020-02-15 DIAGNOSIS — R1312 Dysphagia, oropharyngeal phase: Secondary | ICD-10-CM | POA: Diagnosis not present

## 2020-02-16 DIAGNOSIS — D689 Coagulation defect, unspecified: Secondary | ICD-10-CM | POA: Diagnosis not present

## 2020-02-16 DIAGNOSIS — M6281 Muscle weakness (generalized): Secondary | ICD-10-CM | POA: Diagnosis not present

## 2020-02-16 DIAGNOSIS — N186 End stage renal disease: Secondary | ICD-10-CM | POA: Diagnosis not present

## 2020-02-16 DIAGNOSIS — N2581 Secondary hyperparathyroidism of renal origin: Secondary | ICD-10-CM | POA: Diagnosis not present

## 2020-02-16 DIAGNOSIS — D509 Iron deficiency anemia, unspecified: Secondary | ICD-10-CM | POA: Diagnosis not present

## 2020-02-16 DIAGNOSIS — Z23 Encounter for immunization: Secondary | ICD-10-CM | POA: Diagnosis not present

## 2020-02-16 DIAGNOSIS — Z992 Dependence on renal dialysis: Secondary | ICD-10-CM | POA: Diagnosis not present

## 2020-02-16 DIAGNOSIS — R1312 Dysphagia, oropharyngeal phase: Secondary | ICD-10-CM | POA: Diagnosis not present

## 2020-02-16 DIAGNOSIS — D631 Anemia in chronic kidney disease: Secondary | ICD-10-CM | POA: Diagnosis not present

## 2020-02-17 DIAGNOSIS — M6281 Muscle weakness (generalized): Secondary | ICD-10-CM | POA: Diagnosis not present

## 2020-02-17 DIAGNOSIS — R1312 Dysphagia, oropharyngeal phase: Secondary | ICD-10-CM | POA: Diagnosis not present

## 2020-02-18 DIAGNOSIS — D689 Coagulation defect, unspecified: Secondary | ICD-10-CM | POA: Diagnosis not present

## 2020-02-18 DIAGNOSIS — N2581 Secondary hyperparathyroidism of renal origin: Secondary | ICD-10-CM | POA: Diagnosis not present

## 2020-02-18 DIAGNOSIS — Z992 Dependence on renal dialysis: Secondary | ICD-10-CM | POA: Diagnosis not present

## 2020-02-18 DIAGNOSIS — D509 Iron deficiency anemia, unspecified: Secondary | ICD-10-CM | POA: Diagnosis not present

## 2020-02-18 DIAGNOSIS — Z23 Encounter for immunization: Secondary | ICD-10-CM | POA: Diagnosis not present

## 2020-02-18 DIAGNOSIS — N186 End stage renal disease: Secondary | ICD-10-CM | POA: Diagnosis not present

## 2020-02-18 DIAGNOSIS — D631 Anemia in chronic kidney disease: Secondary | ICD-10-CM | POA: Diagnosis not present

## 2020-02-18 DIAGNOSIS — M6281 Muscle weakness (generalized): Secondary | ICD-10-CM | POA: Diagnosis not present

## 2020-02-18 DIAGNOSIS — R1312 Dysphagia, oropharyngeal phase: Secondary | ICD-10-CM | POA: Diagnosis not present

## 2020-02-19 DIAGNOSIS — M6281 Muscle weakness (generalized): Secondary | ICD-10-CM | POA: Diagnosis not present

## 2020-02-19 DIAGNOSIS — R1312 Dysphagia, oropharyngeal phase: Secondary | ICD-10-CM | POA: Diagnosis not present

## 2020-02-20 DIAGNOSIS — R1312 Dysphagia, oropharyngeal phase: Secondary | ICD-10-CM | POA: Diagnosis not present

## 2020-02-20 DIAGNOSIS — M6281 Muscle weakness (generalized): Secondary | ICD-10-CM | POA: Diagnosis not present

## 2020-02-20 DIAGNOSIS — Z03818 Encounter for observation for suspected exposure to other biological agents ruled out: Secondary | ICD-10-CM | POA: Diagnosis not present

## 2020-02-21 DIAGNOSIS — Z23 Encounter for immunization: Secondary | ICD-10-CM | POA: Diagnosis not present

## 2020-02-21 DIAGNOSIS — D631 Anemia in chronic kidney disease: Secondary | ICD-10-CM | POA: Diagnosis not present

## 2020-02-21 DIAGNOSIS — R69 Illness, unspecified: Secondary | ICD-10-CM | POA: Diagnosis not present

## 2020-02-21 DIAGNOSIS — M6281 Muscle weakness (generalized): Secondary | ICD-10-CM | POA: Diagnosis not present

## 2020-02-21 DIAGNOSIS — N2581 Secondary hyperparathyroidism of renal origin: Secondary | ICD-10-CM | POA: Diagnosis not present

## 2020-02-21 DIAGNOSIS — D509 Iron deficiency anemia, unspecified: Secondary | ICD-10-CM | POA: Diagnosis not present

## 2020-02-21 DIAGNOSIS — D689 Coagulation defect, unspecified: Secondary | ICD-10-CM | POA: Diagnosis not present

## 2020-02-21 DIAGNOSIS — R1312 Dysphagia, oropharyngeal phase: Secondary | ICD-10-CM | POA: Diagnosis not present

## 2020-02-21 DIAGNOSIS — N186 End stage renal disease: Secondary | ICD-10-CM | POA: Diagnosis not present

## 2020-02-21 DIAGNOSIS — Z992 Dependence on renal dialysis: Secondary | ICD-10-CM | POA: Diagnosis not present

## 2020-02-22 DIAGNOSIS — M6281 Muscle weakness (generalized): Secondary | ICD-10-CM | POA: Diagnosis not present

## 2020-02-22 DIAGNOSIS — R1312 Dysphagia, oropharyngeal phase: Secondary | ICD-10-CM | POA: Diagnosis not present

## 2020-02-23 DIAGNOSIS — Z23 Encounter for immunization: Secondary | ICD-10-CM | POA: Diagnosis not present

## 2020-02-23 DIAGNOSIS — M6281 Muscle weakness (generalized): Secondary | ICD-10-CM | POA: Diagnosis not present

## 2020-02-23 DIAGNOSIS — R1312 Dysphagia, oropharyngeal phase: Secondary | ICD-10-CM | POA: Diagnosis not present

## 2020-02-23 DIAGNOSIS — D631 Anemia in chronic kidney disease: Secondary | ICD-10-CM | POA: Diagnosis not present

## 2020-02-23 DIAGNOSIS — D689 Coagulation defect, unspecified: Secondary | ICD-10-CM | POA: Diagnosis not present

## 2020-02-23 DIAGNOSIS — N2581 Secondary hyperparathyroidism of renal origin: Secondary | ICD-10-CM | POA: Diagnosis not present

## 2020-02-23 DIAGNOSIS — D509 Iron deficiency anemia, unspecified: Secondary | ICD-10-CM | POA: Diagnosis not present

## 2020-02-23 DIAGNOSIS — N186 End stage renal disease: Secondary | ICD-10-CM | POA: Diagnosis not present

## 2020-02-23 DIAGNOSIS — Z992 Dependence on renal dialysis: Secondary | ICD-10-CM | POA: Diagnosis not present

## 2020-02-24 DIAGNOSIS — R1312 Dysphagia, oropharyngeal phase: Secondary | ICD-10-CM | POA: Diagnosis not present

## 2020-02-24 DIAGNOSIS — M6281 Muscle weakness (generalized): Secondary | ICD-10-CM | POA: Diagnosis not present

## 2020-02-25 DIAGNOSIS — M6281 Muscle weakness (generalized): Secondary | ICD-10-CM | POA: Diagnosis not present

## 2020-02-25 DIAGNOSIS — D689 Coagulation defect, unspecified: Secondary | ICD-10-CM | POA: Diagnosis not present

## 2020-02-25 DIAGNOSIS — D509 Iron deficiency anemia, unspecified: Secondary | ICD-10-CM | POA: Diagnosis not present

## 2020-02-25 DIAGNOSIS — N186 End stage renal disease: Secondary | ICD-10-CM | POA: Diagnosis not present

## 2020-02-25 DIAGNOSIS — Z23 Encounter for immunization: Secondary | ICD-10-CM | POA: Diagnosis not present

## 2020-02-25 DIAGNOSIS — D631 Anemia in chronic kidney disease: Secondary | ICD-10-CM | POA: Diagnosis not present

## 2020-02-25 DIAGNOSIS — Z992 Dependence on renal dialysis: Secondary | ICD-10-CM | POA: Diagnosis not present

## 2020-02-25 DIAGNOSIS — N2581 Secondary hyperparathyroidism of renal origin: Secondary | ICD-10-CM | POA: Diagnosis not present

## 2020-02-25 DIAGNOSIS — R1312 Dysphagia, oropharyngeal phase: Secondary | ICD-10-CM | POA: Diagnosis not present

## 2020-02-26 DIAGNOSIS — M6281 Muscle weakness (generalized): Secondary | ICD-10-CM | POA: Diagnosis not present

## 2020-02-26 DIAGNOSIS — R1312 Dysphagia, oropharyngeal phase: Secondary | ICD-10-CM | POA: Diagnosis not present

## 2020-02-27 DIAGNOSIS — R1312 Dysphagia, oropharyngeal phase: Secondary | ICD-10-CM | POA: Diagnosis not present

## 2020-02-27 DIAGNOSIS — M6281 Muscle weakness (generalized): Secondary | ICD-10-CM | POA: Diagnosis not present

## 2020-02-27 DIAGNOSIS — Z03818 Encounter for observation for suspected exposure to other biological agents ruled out: Secondary | ICD-10-CM | POA: Diagnosis not present

## 2020-02-28 DIAGNOSIS — D631 Anemia in chronic kidney disease: Secondary | ICD-10-CM | POA: Diagnosis not present

## 2020-02-28 DIAGNOSIS — M6281 Muscle weakness (generalized): Secondary | ICD-10-CM | POA: Diagnosis not present

## 2020-02-28 DIAGNOSIS — R1312 Dysphagia, oropharyngeal phase: Secondary | ICD-10-CM | POA: Diagnosis not present

## 2020-02-28 DIAGNOSIS — D509 Iron deficiency anemia, unspecified: Secondary | ICD-10-CM | POA: Diagnosis not present

## 2020-02-28 DIAGNOSIS — N186 End stage renal disease: Secondary | ICD-10-CM | POA: Diagnosis not present

## 2020-02-28 DIAGNOSIS — Z23 Encounter for immunization: Secondary | ICD-10-CM | POA: Diagnosis not present

## 2020-02-28 DIAGNOSIS — Z992 Dependence on renal dialysis: Secondary | ICD-10-CM | POA: Diagnosis not present

## 2020-02-28 DIAGNOSIS — D689 Coagulation defect, unspecified: Secondary | ICD-10-CM | POA: Diagnosis not present

## 2020-02-28 DIAGNOSIS — N2581 Secondary hyperparathyroidism of renal origin: Secondary | ICD-10-CM | POA: Diagnosis not present

## 2020-02-29 DIAGNOSIS — L89150 Pressure ulcer of sacral region, unstageable: Secondary | ICD-10-CM | POA: Diagnosis not present

## 2020-02-29 DIAGNOSIS — T8189XD Other complications of procedures, not elsewhere classified, subsequent encounter: Secondary | ICD-10-CM | POA: Diagnosis not present

## 2020-02-29 DIAGNOSIS — M6281 Muscle weakness (generalized): Secondary | ICD-10-CM | POA: Diagnosis not present

## 2020-02-29 DIAGNOSIS — R1312 Dysphagia, oropharyngeal phase: Secondary | ICD-10-CM | POA: Diagnosis not present

## 2020-03-01 DIAGNOSIS — G3184 Mild cognitive impairment, so stated: Secondary | ICD-10-CM | POA: Diagnosis not present

## 2020-03-01 DIAGNOSIS — D689 Coagulation defect, unspecified: Secondary | ICD-10-CM | POA: Diagnosis not present

## 2020-03-01 DIAGNOSIS — D631 Anemia in chronic kidney disease: Secondary | ICD-10-CM | POA: Diagnosis not present

## 2020-03-01 DIAGNOSIS — N2581 Secondary hyperparathyroidism of renal origin: Secondary | ICD-10-CM | POA: Diagnosis not present

## 2020-03-01 DIAGNOSIS — R1312 Dysphagia, oropharyngeal phase: Secondary | ICD-10-CM | POA: Diagnosis not present

## 2020-03-01 DIAGNOSIS — M6281 Muscle weakness (generalized): Secondary | ICD-10-CM | POA: Diagnosis not present

## 2020-03-01 DIAGNOSIS — Z23 Encounter for immunization: Secondary | ICD-10-CM | POA: Diagnosis not present

## 2020-03-01 DIAGNOSIS — N186 End stage renal disease: Secondary | ICD-10-CM | POA: Diagnosis not present

## 2020-03-01 DIAGNOSIS — Z992 Dependence on renal dialysis: Secondary | ICD-10-CM | POA: Diagnosis not present

## 2020-03-01 DIAGNOSIS — K30 Functional dyspepsia: Secondary | ICD-10-CM | POA: Diagnosis not present

## 2020-03-01 DIAGNOSIS — Z79899 Other long term (current) drug therapy: Secondary | ICD-10-CM | POA: Diagnosis not present

## 2020-03-01 DIAGNOSIS — D509 Iron deficiency anemia, unspecified: Secondary | ICD-10-CM | POA: Diagnosis not present

## 2020-03-02 ENCOUNTER — Other Ambulatory Visit: Payer: Self-pay

## 2020-03-02 ENCOUNTER — Emergency Department
Admission: EM | Admit: 2020-03-02 | Discharge: 2020-03-02 | Disposition: A | Discharge status: Other Acute Inpatient Hospital | Payer: Medicare Other | Attending: Emergency Medicine | Admitting: Emergency Medicine

## 2020-03-02 ENCOUNTER — Emergency Department: Payer: Medicare Other

## 2020-03-02 ENCOUNTER — Emergency Department (HOSPITAL_COMMUNITY): Payer: Medicare Other

## 2020-03-02 ENCOUNTER — Inpatient Hospital Stay (HOSPITAL_COMMUNITY): Payer: Medicare Other

## 2020-03-02 ENCOUNTER — Inpatient Hospital Stay (HOSPITAL_COMMUNITY)
Admission: EM | Admit: 2020-03-02 | Discharge: 2020-04-04 | DRG: 871 | Disposition: E | Payer: Medicare Other | Source: Other Acute Inpatient Hospital | Attending: Pulmonary Disease | Admitting: Pulmonary Disease

## 2020-03-02 ENCOUNTER — Encounter (HOSPITAL_COMMUNITY): Payer: Self-pay | Admitting: Emergency Medicine

## 2020-03-02 DIAGNOSIS — Z841 Family history of disorders of kidney and ureter: Secondary | ICD-10-CM

## 2020-03-02 DIAGNOSIS — R4182 Altered mental status, unspecified: Secondary | ICD-10-CM | POA: Diagnosis present

## 2020-03-02 DIAGNOSIS — J9811 Atelectasis: Secondary | ICD-10-CM | POA: Diagnosis not present

## 2020-03-02 DIAGNOSIS — E872 Acidosis, unspecified: Secondary | ICD-10-CM

## 2020-03-02 DIAGNOSIS — R0689 Other abnormalities of breathing: Secondary | ICD-10-CM | POA: Diagnosis not present

## 2020-03-02 DIAGNOSIS — I700 Atherosclerosis of aorta: Secondary | ICD-10-CM | POA: Diagnosis present

## 2020-03-02 DIAGNOSIS — Z20822 Contact with and (suspected) exposure to covid-19: Secondary | ICD-10-CM | POA: Insufficient documentation

## 2020-03-02 DIAGNOSIS — N186 End stage renal disease: Secondary | ICD-10-CM | POA: Diagnosis present

## 2020-03-02 DIAGNOSIS — M5126 Other intervertebral disc displacement, lumbar region: Secondary | ICD-10-CM | POA: Diagnosis not present

## 2020-03-02 DIAGNOSIS — Z9049 Acquired absence of other specified parts of digestive tract: Secondary | ICD-10-CM

## 2020-03-02 DIAGNOSIS — Z743 Need for continuous supervision: Secondary | ICD-10-CM | POA: Diagnosis not present

## 2020-03-02 DIAGNOSIS — D649 Anemia, unspecified: Secondary | ICD-10-CM | POA: Diagnosis not present

## 2020-03-02 DIAGNOSIS — Z992 Dependence on renal dialysis: Secondary | ICD-10-CM | POA: Diagnosis not present

## 2020-03-02 DIAGNOSIS — M6281 Muscle weakness (generalized): Secondary | ICD-10-CM | POA: Diagnosis not present

## 2020-03-02 DIAGNOSIS — R7881 Bacteremia: Secondary | ICD-10-CM | POA: Diagnosis not present

## 2020-03-02 DIAGNOSIS — Z66 Do not resuscitate: Secondary | ICD-10-CM | POA: Diagnosis not present

## 2020-03-02 DIAGNOSIS — G9341 Metabolic encephalopathy: Secondary | ICD-10-CM | POA: Diagnosis present

## 2020-03-02 DIAGNOSIS — Z8616 Personal history of COVID-19: Secondary | ICD-10-CM

## 2020-03-02 DIAGNOSIS — K56609 Unspecified intestinal obstruction, unspecified as to partial versus complete obstruction: Secondary | ICD-10-CM | POA: Diagnosis present

## 2020-03-02 DIAGNOSIS — Z6831 Body mass index (BMI) 31.0-31.9, adult: Secondary | ICD-10-CM

## 2020-03-02 DIAGNOSIS — Z8249 Family history of ischemic heart disease and other diseases of the circulatory system: Secondary | ICD-10-CM

## 2020-03-02 DIAGNOSIS — L89890 Pressure ulcer of other site, unstageable: Secondary | ICD-10-CM | POA: Diagnosis not present

## 2020-03-02 DIAGNOSIS — L89154 Pressure ulcer of sacral region, stage 4: Secondary | ICD-10-CM | POA: Diagnosis present

## 2020-03-02 DIAGNOSIS — N250 Renal osteodystrophy: Secondary | ICD-10-CM | POA: Diagnosis not present

## 2020-03-02 DIAGNOSIS — M4628 Osteomyelitis of vertebra, sacral and sacrococcygeal region: Secondary | ICD-10-CM | POA: Diagnosis present

## 2020-03-02 DIAGNOSIS — R579 Shock, unspecified: Secondary | ICD-10-CM

## 2020-03-02 DIAGNOSIS — R6520 Severe sepsis without septic shock: Secondary | ICD-10-CM | POA: Diagnosis not present

## 2020-03-02 DIAGNOSIS — R6889 Other general symptoms and signs: Secondary | ICD-10-CM | POA: Diagnosis not present

## 2020-03-02 DIAGNOSIS — A419 Sepsis, unspecified organism: Secondary | ICD-10-CM

## 2020-03-02 DIAGNOSIS — E039 Hypothyroidism, unspecified: Secondary | ICD-10-CM | POA: Diagnosis present

## 2020-03-02 DIAGNOSIS — N2581 Secondary hyperparathyroidism of renal origin: Secondary | ICD-10-CM | POA: Diagnosis present

## 2020-03-02 DIAGNOSIS — R001 Bradycardia, unspecified: Secondary | ICD-10-CM | POA: Diagnosis not present

## 2020-03-02 DIAGNOSIS — Z981 Arthrodesis status: Secondary | ICD-10-CM | POA: Diagnosis not present

## 2020-03-02 DIAGNOSIS — Z431 Encounter for attention to gastrostomy: Secondary | ICD-10-CM | POA: Diagnosis not present

## 2020-03-02 DIAGNOSIS — A4181 Sepsis due to Enterococcus: Principal | ICD-10-CM | POA: Diagnosis present

## 2020-03-02 DIAGNOSIS — F0390 Unspecified dementia without behavioral disturbance: Secondary | ICD-10-CM | POA: Diagnosis present

## 2020-03-02 DIAGNOSIS — J9601 Acute respiratory failure with hypoxia: Secondary | ICD-10-CM | POA: Diagnosis not present

## 2020-03-02 DIAGNOSIS — G319 Degenerative disease of nervous system, unspecified: Secondary | ICD-10-CM | POA: Diagnosis not present

## 2020-03-02 DIAGNOSIS — I6782 Cerebral ischemia: Secondary | ICD-10-CM | POA: Diagnosis not present

## 2020-03-02 DIAGNOSIS — R6521 Severe sepsis with septic shock: Secondary | ICD-10-CM | POA: Diagnosis not present

## 2020-03-02 DIAGNOSIS — K5792 Diverticulitis of intestine, part unspecified, without perforation or abscess without bleeding: Secondary | ICD-10-CM | POA: Diagnosis not present

## 2020-03-02 DIAGNOSIS — I469 Cardiac arrest, cause unspecified: Secondary | ICD-10-CM | POA: Diagnosis not present

## 2020-03-02 DIAGNOSIS — I5032 Chronic diastolic (congestive) heart failure: Secondary | ICD-10-CM | POA: Diagnosis not present

## 2020-03-02 DIAGNOSIS — E669 Obesity, unspecified: Secondary | ICD-10-CM | POA: Diagnosis present

## 2020-03-02 DIAGNOSIS — E1140 Type 2 diabetes mellitus with diabetic neuropathy, unspecified: Secondary | ICD-10-CM | POA: Diagnosis present

## 2020-03-02 DIAGNOSIS — E1122 Type 2 diabetes mellitus with diabetic chronic kidney disease: Secondary | ICD-10-CM | POA: Diagnosis present

## 2020-03-02 DIAGNOSIS — Z79899 Other long term (current) drug therapy: Secondary | ICD-10-CM | POA: Diagnosis not present

## 2020-03-02 DIAGNOSIS — I120 Hypertensive chronic kidney disease with stage 5 chronic kidney disease or end stage renal disease: Secondary | ICD-10-CM | POA: Diagnosis not present

## 2020-03-02 DIAGNOSIS — Z931 Gastrostomy status: Secondary | ICD-10-CM

## 2020-03-02 DIAGNOSIS — S31000A Unspecified open wound of lower back and pelvis without penetration into retroperitoneum, initial encounter: Secondary | ICD-10-CM | POA: Diagnosis present

## 2020-03-02 DIAGNOSIS — R404 Transient alteration of awareness: Secondary | ICD-10-CM | POA: Diagnosis not present

## 2020-03-02 DIAGNOSIS — K439 Ventral hernia without obstruction or gangrene: Secondary | ICD-10-CM | POA: Diagnosis not present

## 2020-03-02 DIAGNOSIS — Z1621 Resistance to vancomycin: Secondary | ICD-10-CM | POA: Diagnosis present

## 2020-03-02 DIAGNOSIS — L8990 Pressure ulcer of unspecified site, unspecified stage: Secondary | ICD-10-CM

## 2020-03-02 DIAGNOSIS — R1312 Dysphagia, oropharyngeal phase: Secondary | ICD-10-CM | POA: Diagnosis not present

## 2020-03-02 DIAGNOSIS — Z833 Family history of diabetes mellitus: Secondary | ICD-10-CM

## 2020-03-02 DIAGNOSIS — I10 Essential (primary) hypertension: Secondary | ICD-10-CM | POA: Diagnosis not present

## 2020-03-02 DIAGNOSIS — K5669 Other partial intestinal obstruction: Secondary | ICD-10-CM | POA: Diagnosis not present

## 2020-03-02 DIAGNOSIS — D631 Anemia in chronic kidney disease: Secondary | ICD-10-CM | POA: Diagnosis not present

## 2020-03-02 DIAGNOSIS — N200 Calculus of kidney: Secondary | ICD-10-CM | POA: Diagnosis not present

## 2020-03-02 DIAGNOSIS — E11649 Type 2 diabetes mellitus with hypoglycemia without coma: Secondary | ICD-10-CM | POA: Diagnosis not present

## 2020-03-02 DIAGNOSIS — K6389 Other specified diseases of intestine: Secondary | ICD-10-CM | POA: Diagnosis not present

## 2020-03-02 DIAGNOSIS — I672 Cerebral atherosclerosis: Secondary | ICD-10-CM | POA: Diagnosis not present

## 2020-03-02 DIAGNOSIS — Z5189 Encounter for other specified aftercare: Secondary | ICD-10-CM

## 2020-03-02 DIAGNOSIS — Z0189 Encounter for other specified special examinations: Secondary | ICD-10-CM

## 2020-03-02 DIAGNOSIS — L89109 Pressure ulcer of unspecified part of back, unspecified stage: Secondary | ICD-10-CM | POA: Diagnosis not present

## 2020-03-02 DIAGNOSIS — Z4682 Encounter for fitting and adjustment of non-vascular catheter: Secondary | ICD-10-CM | POA: Diagnosis not present

## 2020-03-02 DIAGNOSIS — F32A Depression, unspecified: Secondary | ICD-10-CM | POA: Diagnosis present

## 2020-03-02 DIAGNOSIS — I132 Hypertensive heart and chronic kidney disease with heart failure and with stage 5 chronic kidney disease, or end stage renal disease: Secondary | ICD-10-CM | POA: Diagnosis present

## 2020-03-02 DIAGNOSIS — Z832 Family history of diseases of the blood and blood-forming organs and certain disorders involving the immune mechanism: Secondary | ICD-10-CM

## 2020-03-02 DIAGNOSIS — I468 Cardiac arrest due to other underlying condition: Secondary | ICD-10-CM | POA: Diagnosis not present

## 2020-03-02 DIAGNOSIS — Z90710 Acquired absence of both cervix and uterus: Secondary | ICD-10-CM

## 2020-03-02 DIAGNOSIS — E8889 Other specified metabolic disorders: Secondary | ICD-10-CM | POA: Diagnosis present

## 2020-03-02 DIAGNOSIS — B952 Enterococcus as the cause of diseases classified elsewhere: Secondary | ICD-10-CM | POA: Diagnosis not present

## 2020-03-02 DIAGNOSIS — Z87442 Personal history of urinary calculi: Secondary | ICD-10-CM

## 2020-03-02 DIAGNOSIS — K760 Fatty (change of) liver, not elsewhere classified: Secondary | ICD-10-CM | POA: Diagnosis present

## 2020-03-02 LAB — CBC WITH DIFFERENTIAL/PLATELET
Abs Immature Granulocytes: 1.09 10*3/uL — ABNORMAL HIGH (ref 0.00–0.07)
Basophils Absolute: 0 10*3/uL (ref 0.0–0.1)
Basophils Relative: 0 %
Eosinophils Absolute: 0 10*3/uL (ref 0.0–0.5)
Eosinophils Relative: 0 %
HCT: 36.4 % (ref 36.0–46.0)
Hemoglobin: 11.3 g/dL — ABNORMAL LOW (ref 12.0–15.0)
Immature Granulocytes: 3 %
Lymphocytes Relative: 6 %
Lymphs Abs: 2.3 10*3/uL (ref 0.7–4.0)
MCH: 31.7 pg (ref 26.0–34.0)
MCHC: 31 g/dL (ref 30.0–36.0)
MCV: 102 fL — ABNORMAL HIGH (ref 80.0–100.0)
Monocytes Absolute: 2.7 10*3/uL — ABNORMAL HIGH (ref 0.1–1.0)
Monocytes Relative: 7 %
Neutro Abs: 33 10*3/uL — ABNORMAL HIGH (ref 1.7–7.7)
Neutrophils Relative %: 84 %
Platelets: 257 10*3/uL (ref 150–400)
RBC: 3.57 MIL/uL — ABNORMAL LOW (ref 3.87–5.11)
RDW: 18 % — ABNORMAL HIGH (ref 11.5–15.5)
Smear Review: NORMAL
WBC: 39.1 10*3/uL — ABNORMAL HIGH (ref 4.0–10.5)
nRBC: 0.1 % (ref 0.0–0.2)

## 2020-03-02 LAB — COMPREHENSIVE METABOLIC PANEL
ALT: 22 U/L (ref 0–44)
AST: 40 U/L (ref 15–41)
Albumin: 2 g/dL — ABNORMAL LOW (ref 3.5–5.0)
Alkaline Phosphatase: 90 U/L (ref 38–126)
Anion gap: 19 — ABNORMAL HIGH (ref 5–15)
BUN: 36 mg/dL — ABNORMAL HIGH (ref 8–23)
CO2: 21 mmol/L — ABNORMAL LOW (ref 22–32)
Calcium: 9 mg/dL (ref 8.9–10.3)
Chloride: 95 mmol/L — ABNORMAL LOW (ref 98–111)
Creatinine, Ser: 2.17 mg/dL — ABNORMAL HIGH (ref 0.44–1.00)
GFR calc Af Amer: 25 mL/min — ABNORMAL LOW (ref >60)
GFR calc non Af Amer: 21 mL/min — ABNORMAL LOW (ref >60)
Glucose, Bld: 134 mg/dL — ABNORMAL HIGH (ref 70–99)
Potassium: 4 mmol/L (ref 3.5–5.1)
Sodium: 135 mmol/L (ref 135–145)
Total Bilirubin: 0.7 mg/dL (ref 0.3–1.2)
Total Protein: 5.9 g/dL — ABNORMAL LOW (ref 6.5–8.1)

## 2020-03-02 LAB — SEDIMENTATION RATE: Sed Rate: 8 mm/hr (ref 0–22)

## 2020-03-02 LAB — I-STAT VENOUS BLOOD GAS, ED
Acid-Base Excess: 0 mmol/L (ref 0.0–2.0)
Bicarbonate: 22.2 mmol/L (ref 20.0–28.0)
Calcium, Ion: 1.07 mmol/L — ABNORMAL LOW (ref 1.15–1.40)
HCT: 36 % (ref 36.0–46.0)
Hemoglobin: 12.2 g/dL (ref 12.0–15.0)
O2 Saturation: 83 %
Potassium: 3.5 mmol/L (ref 3.5–5.1)
Sodium: 135 mmol/L (ref 135–145)
TCO2: 23 mmol/L (ref 22–32)
pCO2, Ven: 28.2 mmHg — ABNORMAL LOW (ref 44.0–60.0)
pH, Ven: 7.504 — ABNORMAL HIGH (ref 7.250–7.430)
pO2, Ven: 42 mmHg (ref 32.0–45.0)

## 2020-03-02 LAB — CBC
HCT: 33.9 % — ABNORMAL LOW (ref 36.0–46.0)
Hemoglobin: 10.3 g/dL — ABNORMAL LOW (ref 12.0–15.0)
MCH: 31 pg (ref 26.0–34.0)
MCHC: 30.4 g/dL (ref 30.0–36.0)
MCV: 102.1 fL — ABNORMAL HIGH (ref 80.0–100.0)
Platelets: 213 10*3/uL (ref 150–400)
RBC: 3.32 MIL/uL — ABNORMAL LOW (ref 3.87–5.11)
RDW: 18 % — ABNORMAL HIGH (ref 11.5–15.5)
WBC: 48.7 10*3/uL — ABNORMAL HIGH (ref 4.0–10.5)
nRBC: 0.1 % (ref 0.0–0.2)

## 2020-03-02 LAB — GLUCOSE, CAPILLARY
Glucose-Capillary: 87 mg/dL (ref 70–99)
Glucose-Capillary: 87 mg/dL (ref 70–99)

## 2020-03-02 LAB — APTT: aPTT: 34 seconds (ref 24–36)

## 2020-03-02 LAB — C-REACTIVE PROTEIN: CRP: 20.4 mg/dL — ABNORMAL HIGH (ref <1.0)

## 2020-03-02 LAB — LACTIC ACID, PLASMA
Lactic Acid, Venous: 6.3 mmol/L (ref 0.5–1.9)
Lactic Acid, Venous: 7.9 mmol/L (ref 0.5–1.9)
Lactic Acid, Venous: 6.4 mmol/L (ref 0.5–1.9)
Lactic Acid, Venous: 5 mmol/L (ref 0.5–1.9)

## 2020-03-02 LAB — CREATININE, SERUM
Creatinine, Ser: 2.3 mg/dL — ABNORMAL HIGH (ref 0.44–1.00)
GFR calc Af Amer: 23 mL/min — ABNORMAL LOW (ref >60)
GFR calc non Af Amer: 20 mL/min — ABNORMAL LOW (ref >60)

## 2020-03-02 LAB — TSH: TSH: 4.948 u[IU]/mL — ABNORMAL HIGH (ref 0.350–4.500)

## 2020-03-02 LAB — SARS CORONAVIRUS 2 BY RT PCR (HOSPITAL ORDER, PERFORMED IN ~~LOC~~ HOSPITAL LAB): SARS Coronavirus 2: NEGATIVE

## 2020-03-02 LAB — AMMONIA: Ammonia: 18 umol/L (ref 9–35)

## 2020-03-02 LAB — PROTIME-INR
INR: 1.6 — ABNORMAL HIGH (ref 0.8–1.2)
Prothrombin Time: 18 seconds — ABNORMAL HIGH (ref 11.4–15.2)

## 2020-03-02 LAB — VALPROIC ACID LEVEL: Valproic Acid Lvl: <10 ug/mL — ABNORMAL LOW (ref 50.0–100.0)

## 2020-03-02 MED ORDER — CHLORHEXIDINE GLUCONATE CLOTH 2 % EX PADS
6.0000 | MEDICATED_PAD | Freq: Every day | CUTANEOUS | Status: DC
  Administered 2020-03-02: 6 via TOPICAL

## 2020-03-02 MED ORDER — LEVOTHYROXINE SODIUM 100 MCG/5ML IV SOLN: 50.0000 ug | Freq: Every day | INTRAVENOUS | Status: DC

## 2020-03-02 MED ORDER — LACTATED RINGERS IV SOLN: INTRAVENOUS | Status: DC

## 2020-03-02 MED ORDER — VANCOMYCIN HCL 1750 MG/350ML IV SOLN
1750.0000 mg | Freq: Once | INTRAVENOUS | Status: AC
  Administered 2020-03-02: 1750 mg via INTRAVENOUS
  Filled 2020-03-02: qty 350

## 2020-03-02 MED ORDER — NOREPINEPHRINE 4 MG/250ML-% IV SOLN
INTRAVENOUS | Status: AC
  Filled 2020-03-02: qty 250

## 2020-03-02 MED ORDER — LACTATED RINGERS IV BOLUS
1000.0000 mL | Freq: Once | INTRAVENOUS | Status: AC
  Administered 2020-03-02: 1000 mL via INTRAVENOUS

## 2020-03-02 MED ORDER — LACTATED RINGERS IV BOLUS
2000.0000 mL | Freq: Once | INTRAVENOUS | Status: AC
  Administered 2020-03-02: 2000 mL via INTRAVENOUS

## 2020-03-02 MED ORDER — CHLORHEXIDINE GLUCONATE 0.12 % MT SOLN
15.0000 mL | Freq: Two times a day (BID) | OROMUCOSAL | Status: DC
  Administered 2020-03-02 – 2020-03-03 (×3): 15 mL via OROMUCOSAL
  Filled 2020-03-02 (×2): qty 15

## 2020-03-02 MED ORDER — POLYETHYLENE GLYCOL 3350 17 G PO PACK: 17.0000 g | PACK | Freq: Every day | ORAL | Status: DC | PRN

## 2020-03-02 MED ORDER — INSULIN ASPART 100 UNIT/ML ~~LOC~~ SOLN
0.0000 [IU] | SUBCUTANEOUS | Status: DC
  Administered 2020-03-04: 1 [IU] via SUBCUTANEOUS

## 2020-03-02 MED ORDER — LEVOTHYROXINE SODIUM 25 MCG PO TABS: 100.0000 ug | ORAL_TABLET | Freq: Every day | ORAL | Status: DC

## 2020-03-02 MED ORDER — SODIUM CHLORIDE 0.9% FLUSH
10.0000 mL | Freq: Two times a day (BID) | INTRAVENOUS | Status: DC
  Administered 2020-03-02: 20 mL
  Administered 2020-03-03 (×2): 10 mL

## 2020-03-02 MED ORDER — SODIUM CHLORIDE 0.9 % IV SOLN: 250.0000 mL | INTRAVENOUS | Status: DC

## 2020-03-02 MED ORDER — ORAL CARE MOUTH RINSE
15.0000 mL | Freq: Two times a day (BID) | OROMUCOSAL | Status: DC
  Administered 2020-03-03 (×2): 15 mL via OROMUCOSAL

## 2020-03-02 MED ORDER — SODIUM CHLORIDE 0.9 % IV SOLN
1.0000 g | Freq: Once | INTRAVENOUS | Status: AC
  Administered 2020-03-02: 1 g via INTRAVENOUS
  Filled 2020-03-02: qty 1

## 2020-03-02 MED ORDER — NOREPINEPHRINE 4 MG/250ML-% IV SOLN
0.0000 ug/min | INTRAVENOUS | Status: DC
  Administered 2020-03-03: 2 ug/min via INTRAVENOUS
  Filled 2020-03-02: qty 250

## 2020-03-02 MED ORDER — LACTATED RINGERS IV BOLUS: 500.0000 mL | Freq: Once | INTRAVENOUS | Status: DC

## 2020-03-02 MED ORDER — HEPARIN SODIUM (PORCINE) 5000 UNIT/ML IJ SOLN
5000.0000 [IU] | Freq: Three times a day (TID) | INTRAMUSCULAR | Status: DC
  Administered 2020-03-02 – 2020-03-03 (×4): 5000 [IU] via SUBCUTANEOUS
  Filled 2020-03-02 (×4): qty 1

## 2020-03-02 MED ORDER — SODIUM CHLORIDE 0.9% FLUSH: 10.0000 mL | INTRAVENOUS | Status: DC | PRN

## 2020-03-02 MED ORDER — MORPHINE SULFATE (PF) 2 MG/ML IV SOLN
1.0000 mg | INTRAVENOUS | Status: DC | PRN
  Administered 2020-03-04 (×2): 1 mg via INTRAVENOUS
  Filled 2020-03-02 (×2): qty 1

## 2020-03-02 MED ORDER — DIATRIZOATE MEGLUMINE & SODIUM 66-10 % PO SOLN
90.0000 mL | Freq: Once | ORAL | Status: AC
  Administered 2020-03-03: 90 mL via NASOGASTRIC
  Filled 2020-03-02 (×2): qty 90

## 2020-03-02 MED ORDER — DOCUSATE SODIUM 100 MG PO CAPS: 100.0000 mg | ORAL_CAPSULE | Freq: Two times a day (BID) | ORAL | Status: DC | PRN

## 2020-03-02 NOTE — ED Provider Notes (Signed)
[facility] Emergency Department Provider Note   ____________________________________________   First MD Initiated Contact with Patient 02/15/2020 [phone]     (approximate)  I have reviewed the triage vital signs and the nursing notes.   HISTORY  Chief Complaint Altered Mental Status and Other (G tube issues)  EM caveat: Poor historian, altered mental status, encephalopathy  HPI Taylor Tyler is a 79 y.o. female extensive medical history including end-stage renal disease, decubitus wounds, diverticulitis, hypothyroidism, fatty liver previous Covid  Patient resides in nursing facility.   Per EMS they were called for concerns of fecal material noted to be coming from her feeding tube site and also increase in altered mental status.  They report that patient has severe wounds over her back which is being managed by her care facility  Patient herself denies pain.  She is not able to give a good history, fatigued but in no acute distress.  Past Medical History:  Diagnosis Date  . Acute blood loss anemia 03/14/2017  . Acute renal failure superimposed on stage 3 chronic kidney disease ([place]) 03/14/2017  . AKI (acute kidney injury) ([place]) 02/28/2017  . Altered mental status 03/29/2017  . Anemia   . Aortic atherosclerosis ([place])   . Arthritis    "joints might ache at times; not that bad" (06/10/2018)  . Bilateral lower extremity edema 03/04/2017  . Bradycardia   . Chronic kidney disease    ?? renal insufficiency,   . CKD (chronic kidney disease) stage 3, GFR 30-59 ml/min 01/15/2017   ?? renal insufficiency, which she thinks is coming from "all these medications"  . COVID-19    10/17/19  . Dementia ([place])   . Diabetes mellitus without complication ([place])    diagnosed 4-5 yrs ago, 06/21/18- "that was years ago"  . Disease of pancreas   . Diverticulitis    s/p perforation and partial colectomy 01/27/14 with 3 benign lymph nodes   . Diverticulosis 01/15/2017  . DJD  (degenerative joint disease)   . DM (diabetes mellitus), type 2 with renal complications ([place]) 6/94/8546  . Elevated ferritin level   . Fatty liver   . Hypertension   . Hypothyroidism    "had radiation" (06/10/2018)  . Kidney disease, chronic, stage V (GFR under 15 ml/min) ([place])    per notes from nursing home-she is on dialysis  . Kidney stone   . Lethargy 02/28/2017  . Obesity, Class III, BMI 40-49.9 (morbid obesity) ([place]) 02/27/2017  . Pleural lipoma   . Postoperative wound infection 04/02/2017  . Spinal stenosis of lumbar region 01/15/2017  . Status post lumbar surgery   . Wound healing, delayed    back    Patient Active Problem List   Diagnosis Date Noted  . Severe sepsis ([place]) 02/21/2020  . Depression 02/07/2020  . Chronic diastolic CHF (congestive heart failure) ([place]) 02/27/2020  . Sacral wound 02/12/2020  . SBO (small bowel obstruction) ([place]) 02/13/2020  . S/P percutaneous endoscopic gastrostomy (PEG) tube placement ([place]) 12/24/2019  . Failure to thrive in adult   . Osteomyelitis ([place])   . Goals of care, counseling/discussion   . Palliative care by specialist   . DNR (do not resuscitate) discussion   . Sacral decubitus ulcer, stage IV ([place]) 12/12/2019  . Severe sepsis with septic shock ([place]) 12/12/2019  . Encephalopathy acute 12/11/2019  . Wound infection 11/21/2019  . Leukocytosis 11/19/2019  . Acute metabolic encephalopathy 27/10/5007  . Dementia without behavioral disturbance ([place]) 11/03/2019  . History of anemia  due to chronic kidney disease 11/03/2019  . ESRD (end stage renal disease) ([place]) 07/22/2019  . Fever in adult   . Pressure injury of skin 06/08/2019  . Acute CHF (congestive heart failure) ([place]) 05/30/2019  . Infection and inflammatory reaction due to internal fixation device of spine, initial encounter ([place]) 06/05/2018  . Neuropathy 01/08/2018  . Hypokalemia 01/08/2018  . Fatty liver 01/05/2018  . Chronic pain of both knees 01/05/2018  . Ventral hernia  01/05/2018  . CKD (chronic kidney disease) stage 4, GFR 15-29 ml/min (HCC) 07/21/2017  . Vitamin D deficiency 07/21/2017  . Essential hypertension   . Postoperative wound infection 04/02/2017  . Anemia of chronic disease 03/14/2017  . Acute blood loss anemia 03/14/2017  . Acute renal failure superimposed on stage 3 chronic kidney disease ([place]) 03/14/2017  . Bilateral leg edema 03/04/2017  . Obesity, Class III, BMI 40-49.9 (morbid obesity) ([place]) 02/27/2017  . DM (diabetes mellitus), type 2 with renal complications ([place]) 78/93/8101  . Status post lumbar surgery 02/24/2017  . CKD (chronic kidney disease) stage 3, GFR 30-59 ml/min 01/15/2017  . Hypothyroidism 01/15/2017  . Diverticulosis 01/15/2017  . HLD (hyperlipidemia) 01/15/2017  . Spinal stenosis of lumbar region 01/15/2017  . Disease of pancreas 07/21/2012    Past Surgical History:  Procedure Laterality Date  . ABDOMINAL EXPOSURE N/A 02/24/2017   Procedure: ABDOMINAL EXPOSURE;  Surgeon: Angelia Mould, MD;  Location: [place];  Service: Vascular;  Laterality: N/A;  . ABDOMINAL HYSTERECTOMY  1987   no h/o abnormal paps   . ANTERIOR LAT LUMBAR FUSION N/A 02/24/2017   Procedure: Lumbar three- five Anterior lateral lumbar interbody fusion;  Surgeon: Ditty, Kevan Ny, MD;  Location: [place];  Service: Neurosurgery;  Laterality: N/A;  L3-5 Anterior lateral lumbar interbody fusion with removal of coflex at L3-4, L4-5  . ANTERIOR LUMBAR FUSION N/A 02/24/2017   Procedure: Stage 1: Lumbar five-Sacral one Anterior lumbar interbody fusion;  Surgeon: Ditty, Kevan Ny, MD;  Location: [place];  Service: Neurosurgery;  Laterality: N/A;  Stage 1: L5-S1 Anterior lumbar interbody fusion  . APPENDECTOMY    . APPLICATION OF ROBOTIC ASSISTANCE FOR SPINAL PROCEDURE N/A 02/26/2017   Procedure: APPLICATION OF ROBOTIC ASSISTANCE FOR SPINAL PROCEDURE;  Surgeon: Ditty, Kevan Ny, MD;  Location: [place];  Service: Neurosurgery;  Laterality: N/A;  .  APPLICATION OF WOUND VAC N/A 03/30/2017   Procedure: APPLICATION OF WOUND VAC;  Surgeon: Ditty, Kevan Ny, MD;  Location: [place];  Service: Neurosurgery;  Laterality: N/A;  . APPLICATION OF WOUND VAC N/A 06/23/2019   Procedure: APPLICATION OF WOUND VAC;  Surgeon: Consuella Lose, MD;  Location: [place];  Service: Neurosurgery;  Laterality: N/A;  . AV FISTULA PLACEMENT Left 06/08/2019   Procedure: Arteriovenous (Av) Fistula Creation;  Surgeon: Rosetta Posner, MD;  Location: [place];  Service: Vascular;  Laterality: Left;  . BACK SURGERY  2013  . CATARACT EXTRACTION W/ INTRAOCULAR LENS IMPLANT Right   . CHOLECYSTECTOMY OPEN  1982  . COLON SURGERY  01/26/2014   desc.sigmoid colectomy and ventral hernia repair and splenic flexure mobilization  . COLON SURGERY    . DILATION AND CURETTAGE OF UTERUS    . ESOPHAGOGASTRODUODENOSCOPY  12/24/2019   normal with PEG placement Dr. Alice Reichert  . HERNIA REPAIR    . INSERTION OF DIALYSIS CATHETER Right 06/08/2019   Procedure: INSERTION OF DIALYSIS CATHETER RIGHT INTERNAL JUGULAR;  Surgeon: Rosetta Posner, MD;  Location: [place];  Service: Vascular;  Laterality: Right;  .  LUMBAR LAMINECTOMY WITH SPINOUS PROCESS PLATE 2 LEVEL N/A 11/20/6220   Procedure: LUMBAR LAMINECTOMY/DECOMPRESSION MICRODISCECTOMY CoFlex;  Surgeon: Faythe Ghee, MD;  Location: [facility];  Service: Neurosurgery;  Laterality: N/A;  Lumbar three-four,Lumbar Four-Five Laminectomy with Coflex  . LUMBAR WOUND DEBRIDEMENT N/A 03/30/2017   Procedure: Lumbar wound exploration/debridement, placement of wound vac;  Surgeon: Ditty, Kevan Ny, MD;  Location: [place];  Service: Neurosurgery;  Laterality: N/A;  Lumbar wound exploration/debridement, placement of wound vac  . LUMBAR WOUND DEBRIDEMENT N/A 06/06/2018   Procedure: LUMBAR WOUND DEBRIDEMENT/EXPLORATION;  Surgeon: Consuella Lose, MD;  Location: [place];  Service: Neurosurgery;  Laterality: N/A;  . LUMBAR WOUND DEBRIDEMENT N/A 06/22/2018    Procedure: SIMPLE INCISION AND DRAINAGE OF WOUND, APPLICATION OF WOUND VAC;  Surgeon: Consuella Lose, MD;  Location: [place];  Service: Neurosurgery;  Laterality: N/A;  . LUMBAR WOUND DEBRIDEMENT N/A 06/23/2019   Procedure: LUMBAR WOUND DEBRIDEMENT WITH HARDWARE REMOVAL;  Surgeon: Consuella Lose, MD;  Location: [place];  Service: Neurosurgery;  Laterality: N/A;  . PARTIAL COLECTOMY     01/27/14 diverticulitis and 3 benign lymph nodes [facility] Dr. Pat Patrick   . PEG PLACEMENT N/A 12/24/2019   Procedure: PERCUTANEOUS ENDOSCOPIC GASTROSTOMY (PEG) PLACEMENT;  Surgeon: Toledo, [name], MD;  Location: [facility];  Service: Gastroenterology;  Laterality: N/A;  . REDUCTION MAMMAPLASTY Bilateral 1992  . THROMBECTOMY BRACHIAL ARTERY Left 06/08/2019   Procedure: Thrombectomy Brachial Artery;  Surgeon: Rosetta Posner, MD;  Location: [place];  Service: Vascular;  Laterality: Left;  . TUBAL LIGATION    . VENTRAL HERNIA REPAIR  2014    Prior to Admission medications   Medication Sig Start Date End Date Taking? Authorizing Provider  acetaminophen (TYLENOL) 500 MG tablet Take 500 mg by mouth daily as needed for mild pain.    Yes [provider]  atorvastatin (LIPITOR) 40 MG tablet Take 1 tablet (40 mg total) by mouth daily at 6 PM. 09/03/18  Yes McLean-Scocuzza, Nino Glow, MD  B Complex-C (B-COMPLEX WITH VITAMIN C) tablet Place 1 tablet into feeding tube at bedtime. 12/30/19  Yes Jennye Boroughs, MD  ciprofloxacin (CIPRO) 500 MG tablet Take 500 mg by mouth 2 (two) times daily. 02/28/20 03/16/20 Yes [provider]  citalopram (CELEXA) 20 MG tablet Take 20 mg by mouth daily.    Yes [provider]  collagenase (SANTYL) ointment Apply topically daily. 11/24/19  Yes Nicole Kindred A, DO  divalproex (DEPAKOTE SPRINKLE) 125 MG capsule Take 125 mg by mouth 2 (two) times daily.    Yes [provider]  docusate (COLACE) 50 MG/5ML liquid Take 100 mg by mouth 2 (two) times daily as needed for mild  constipation.   Yes [provider]  donepezil (ARICEPT) 5 MG tablet Take 5 mg by mouth at bedtime.   Yes [provider]  folic acid (FOLVITE) 979 MCG tablet Take 400 mcg by mouth daily.    Yes [provider]  gabapentin (NEURONTIN) 100 MG capsule Take 2 capsules (200 mg total) by mouth at bedtime. 07/04/19  Yes Regalado, Belkys A, MD  HYDROcodone-acetaminophen (NORCO/VICODIN) 5-325 MG tablet Take 1 tablet by mouth every 6 (six) hours as needed for moderate pain or severe pain.   Yes [provider]  Lactobacillus (ACIDOPHILUS PO) Take 175 mg by mouth 2 (two) times daily.   Yes [provider]  lactulose, encephalopathy, (GENERLAC) 10 GM/15ML SOLN Take 10 g by mouth daily.   Yes [provider]  levothyroxine (SYNTHROID, [place])  100 MCG tablet Take 100 mcg by mouth daily before breakfast.   Yes [provider]  meclizine (ANTIVERT) 12.5 MG tablet Take 1 tablet (12.5 mg total) by mouth daily as needed for dizziness. 11/09/19  Yes Enzo Bi, MD  memantine (NAMENDA) 5 MG tablet Take 5 mg by mouth 2 (two) times daily.   Yes [provider]  midodrine (PROAMATINE) 5 MG tablet Place 1 tablet (5 mg total) into feeding tube 3 (three) times daily with meals. 12/30/19  Yes Jennye Boroughs, MD  Nutritional Supplements (FEEDING SUPPLEMENT, JEVITY 1.5 CAL/FIBER,) LIQD Place 1,000 mLs into feeding tube continuous. 12/30/19  Yes Jennye Boroughs, MD  risperiDONE (RISPERDAL) 0.25 MG tablet Take 0.25 mg by mouth daily.    Yes [provider]  sulfamethoxazole-trimethoprim (BACTRIM DS) 800-160 MG tablet Take 1 tablet by mouth 2 (two) times daily.   Yes [provider]  traMADol (ULTRAM) 50 MG tablet Take 50 mg by mouth every 6 (six) hours as needed for moderate pain.   Yes [provider]  zinc oxide 20 % ointment Apply 1 application topically in the morning, at noon, and at bedtime.   Yes [provider]  Amino  Acids-Protein Hydrolys (FEEDING SUPPLEMENT, PRO-STAT SUGAR FREE 64,) LIQD Place 30 mLs into feeding tube 2 (two) times daily. 12/30/19   Jennye Boroughs, MD  epoetin alfa (EPOGEN) 4000 UNIT/ML injection Inject 1 mL (4,000 Units total) into the skin Every Tuesday,Thursday,and Saturday with dialysis. 11/09/19   Enzo Bi, MD    Allergies Clams [shellfish allergy], Hydralazine, Norvasc [amlodipine besylate], and Milk-related compounds  Family History  Problem Relation Age of Onset  . Diabetes Father   . Heart attack Father   . Hypertension Father   . Heart disease Father   . Kidney disease Father   . Diabetes Mother   . Hypertension Mother   . Heart disease Mother   . Kidney disease Mother   . Lupus Sister   . Heart disease Brother   . Diabetes Brother   . Heart disease Brother   . Diabetes Brother   . Lupus Sister     Social History Social History   Tobacco Use  . Smoking status: Never Smoker  . Smokeless tobacco: Never Used  Vaping Use  . Vaping Use: Never used  Substance Use Topics  . Alcohol use: Not Currently    Comment: "nothing since age 38" (06/10/2018)  . Drug use: Never    Review of Systems  EM caveat Specifically denies trouble breathing.  Denies chest pain.  ____________________________________________   PHYSICAL EXAM:  VITAL SIGNS: ED Triage Vitals  Enc Vitals Group     BP 02/08/2020 0843 (!) 105/55     Pulse Rate 02/29/2020 0843 97     Resp 02/14/2020 0843 22     Temp 02/13/2020 0843 98.1 F (36.7 C)     Temp Source 02/09/2020 0843 Oral     SpO2 02/13/2020 0843 96 %     Weight 02/03/2020 0844 195 lb 1.7 oz (88.5 kg)     Height 02/04/2020 0844 5\' 6"  (1.676 m)     Head Circumference --      Peak Flow --      Pain Score --      Pain Loc --      Pain Edu? --      Excl. in [place]? --     Constitutional: Alert and oriented to self and place but not to year.  Chronically ill but in  no acute distress.  Eyes: Conjunctivae are normal. Head: Atraumatic. Nose: No  congestion/rhinnorhea. Mouth/Throat: Mucous membranes are dry. Neck: No stridor.  Cardiovascular: Normal rate, regular rhythm. Grossly normal heart sounds.  Good peripheral circulation. Respiratory: Normal respiratory effort.  No retractions. Lungs CTAB. Gastrointestinal: Soft and nontender.  There is distention what appears to be a soft nontender large ventral hernia.  In addition patient has a feeding tube in the left upper quadrant that does have what appears to be dark or bilious possibly fecal content within Musculoskeletal: No lower extremity tenderness.  Patient is logrolled examined, she has dressings over her thoracic and lumbar spine, these are unfortunately soiled with brown stool.  The edges of the wounds do not demonstrate obvious infection, there is some erythema and is foul odor possibly related to the stool.  Unstageable in nature Neurologic:  Normal speech and language. No gross focal neurologic deficits are appreciated.  Skin:  Skin is warm, dry and intact except as noted over the back. Psychiatric: Mood and affect are calm  ____________________________________________   LABS (all labs ordered are listed, but only abnormal results are displayed)  Labs Reviewed  LACTIC ACID, PLASMA - Abnormal; Notable for the following components:      Result Value   Lactic Acid, Venous 6.3 (*)    All other components within normal limits  LACTIC ACID, PLASMA - Abnormal; Notable for the following components:   Lactic Acid, Venous 7.9 (*)    All other components within normal limits  COMPREHENSIVE METABOLIC PANEL - Abnormal; Notable for the following components:   Chloride 95 (*)    CO2 21 (*)    Glucose, Bld 134 (*)    BUN 36 (*)    Creatinine, Ser 2.17 (*)    Total Protein 5.9 (*)    Albumin 2.0 (*)    GFR calc non Af Amer 21 (*)    GFR calc Af Amer 25 (*)    Anion gap 19 (*)    All other components within normal limits  CBC WITH DIFFERENTIAL/PLATELET - Abnormal; Notable for the  following components:   WBC 39.1 (*)    RBC 3.57 (*)    Hemoglobin 11.3 (*)    MCV 102.0 (*)    RDW 18.0 (*)    Neutro Abs 33.0 (*)    Monocytes Absolute 2.7 (*)    Abs Immature Granulocytes 1.09 (*)    All other components within normal limits  PROTIME-INR - Abnormal; Notable for the following components:   Prothrombin Time 18.0 (*)    INR 1.6 (*)    All other components within normal limits  TSH - Abnormal; Notable for the following components:   TSH 4.948 (*)    All other components within normal limits  LACTIC ACID, PLASMA - Abnormal; Notable for the following components:   Lactic Acid, Venous 6.4 (*)    All other components within normal limits  SARS CORONAVIRUS 2 BY RT PCR (HOSPITAL ORDER, [facility])  CULTURE, BLOOD (SINGLE)  CULTURE, BLOOD (SINGLE)  URINE CULTURE  APTT  AMMONIA  GLUCOSE, CAPILLARY  URINALYSIS, COMPLETE (UACMP) WITH MICROSCOPIC   ____________________________________________  EKG  Reviewed interpreted at 9 AM Heart rate 109 QRS 99 QTc 540 Probable left ventricular hypertrophy, repolarization abnormality. Some slurring T waves somewhat diffuse fashion, nonspecific ____________________________________________  RADIOLOGY  CT ABDOMEN PELVIS WO CONTRAST  Result Date: 02/11/2020 CLINICAL DATA:  Fecal material in feeding tube.  Sacral wounds. EXAM: CT ABDOMEN AND PELVIS  WITHOUT CONTRAST TECHNIQUE: Multidetector CT imaging of the abdomen and pelvis was performed following the standard protocol without IV contrast. COMPARISON:  Dec 11, 2019. FINDINGS: Lower chest: No acute abnormality. Hepatobiliary: No focal liver abnormality is seen. Status post cholecystectomy. No biliary dilatation. Pancreas: Unremarkable. No pancreatic ductal dilatation or surrounding inflammatory changes. Spleen: Normal in size without focal abnormality. Adrenals/Urinary Tract: Adrenal glands appear normal. Nonobstructive left renal calculus is noted. No  hydronephrosis or renal obstruction is noted. Urinary bladder is decompressed. Stomach/Bowel: Gastrostomy tube is seen within the gastric lumen. The stomach is mild-to-moderately distended and fluid-filled. There is interval development of significant proximal small bowel dilatation is noted, with several loops of small bowel seen in large ventral hernia in the pelvis. The possibility of obstruction secondary to this herniated bowel cannot be excluded. There also appears to be a portion of transverse colon extending into large supraumbilical hernia, but it does not appear to be resulting in obstruction. Vascular/Lymphatic: Aortic atherosclerosis. No enlarged abdominal or pelvic lymph nodes. Reproductive: Status post hysterectomy. No adnexal masses. Other: 7.3 x 3.8 cm fluid collection is now seen in presacral space which contains air in its more superior portion, and is concerning for possible abscess or postoperative seroma. Large decubitus ulcer is seen overlying the distal portion of the sacrum and extending inferiorly, which is significantly enlarged compared to prior exam. Musculoskeletal: Extensive postsurgical changes are seen involving the lumbar spine, including laminectomies of L4, L5 and S1. Status post surgical anterior fusion of L5-S1. Patient appears to have undergone intrapedicular screw removal at the L3, L4 and L5 levels. There is a large fluid collection with air-fluid level in the soft tissues posterior to the lower lumbar spine extending from L3-S1. IMPRESSION: 1. Interval development of significant proximal small bowel dilatation is noted, with several loops of small bowel seen in large ventral hernia in the pelvis. The possibility of obstruction secondary to this herniated bowel cannot be excluded. 2. Significant enlargement of decubitus ulcer which is seen primarily just inferior to the sacrum posteriorly. 3. There also appears to be a portion of transverse colon extending into large  supraumbilical hernia, but it does not appear to be resulting in obstruction. 4. 7.3 x 3.8 cm fluid collection is now seen in presacral space which contains air in its more superior portion, and is concerning for possible abscess or postoperative seroma. 5. Extensive postsurgical changes are seen involving the lumbar spine, including laminectomies of L4, L5 and S1. Patient appears to have undergone intrapedicular screw removal at the L3, L4 and L5 levels. There is a large fluid collection with air-fluid level in the soft tissues posterior to the lower lumbar spine extending from L3-S1. This is not significantly changed compared to prior exam. 6. Nonobstructive left renal calculus. No hydronephrosis or renal obstruction is noted. Aortic Atherosclerosis (ICD10-I70.0). Electronically Signed   By: Marijo Conception M.D.   On: 03/01/2020 10:42   CT Head Wo Contrast  Result Date: 02/03/2020 CLINICAL DATA:  Altered mental status. EXAM: CT HEAD WITHOUT CONTRAST TECHNIQUE: Contiguous axial images were obtained from the base of the skull through the vertex without intravenous contrast. COMPARISON:  12/11/2019 FINDINGS: Brain: Generalized atrophy. Chronic small-vessel ischemic changes of the cerebral hemispheric white matter. No sign of recent infarction, mass lesion, hemorrhage, hydrocephalus or extra-axial collection. Vascular: There is atherosclerotic calcification of the major vessels at the base of the brain. Skull: Negative Sinuses/Orbits: Clear/normal Other: None IMPRESSION: No acute intracranial finding by CT. Atrophy and chronic small-vessel ischemic  changes as seen previously. Electronically Signed   By: Nelson Chimes M.D.   On: 02/16/2020 10:35   CT L-SPINE NO CHARGE  Result Date: 02/18/2020 CLINICAL DATA:  Altered mental status.  Back pain. EXAM: CT LUMBAR SPINE WITHOUT CONTRAST TECHNIQUE: Multidetector CT imaging of the lumbar spine was performed without intravenous contrast administration. Multiplanar CT  image reconstructions were also generated. COMPARISON:  12/19/2019.  06/17/2019. FINDINGS: Segmentation: 5 lumbar type vertebral bodies as numbered previously. Alignment: Anterolisthesis at L4-5 of 8 mm now present. Vertebrae: Anterior fusion at L5-S1. Removal of posterior hardware previously present from L3 to the sacrum. Screw shadows remain visible. Probable solid union at L3-4. Probable nonunion at L4-5 with partial anterior extrusion of the interbody spacer. Some endplate irregularity. Possibility of infection is not excluded at this level. Solid union at L5-S1. Paraspinal and other soft tissues: Fluid collection posteriorly from L3 to the sacrum, containing air or gas, worrisome for infection absent recent intervention. Disc levels: No significant finding seen at T12-L1 or L1-2. L2-3: Disc bulge. Facet degeneration and hypertrophy. Stenosis of the lateral recesses. L3 to sacrum: Previous posterior decompression and surgical changes as above. IMPRESSION: Status post posterior fusion hardware removal from L3 to the sacrum. Previous anterior discectomy and fusion procedure L5-S1 appears solid. L3-4 fusion appears solid. Probable nonunion at L4-5 with development of 8 mm of anterolisthesis and partial anterior extrusion of the interbody spacer. Some endplate irregularity. Infection at this level not excluded. Posterior fluid collection in the region of the surgery with air/gas, suggesting possibility of infection, absent performance of recent surgery. Electronically Signed   By: Nelson Chimes M.D.   On: 02/13/2020 10:34   DG Chest Port 1 View  Result Date: 02/21/2020 CLINICAL DATA:  Altered mental status, end-stage renal disease, diabetes mellitus, hypertension EXAM: PORTABLE CHEST 1 VIEW COMPARISON:  Portable exam 0939 hours compared to 12/12/2019 FINDINGS: RIGHT jugular dual-lumen catheter with tip projecting over RIGHT atrium. Enlargement of cardiac silhouette with pulmonary vascular congestion.  Atherosclerotic calcification aorta. Decreased lung volumes with bibasilar atelectasis. Slight crowding of markings in the perihilar regions versus minimal pulmonary edema. No pleural effusion or pneumothorax. IMPRESSION: Decreased lung volumes with bibasilar atelectasis. Enlargement of cardiac silhouette. Cannot exclude minimal perihilar edema. Electronically Signed   By: Lavonia Dana M.D.   On: 02/05/2020 09:48    Imaging studies reviewed by me discussed with Dr. Celine Ahr. Several of the key findings are bolded ____________________________________________   PROCEDURES  Procedure(s) performed: None  Procedures  Critical Care performed: Yes, see critical care note(s)  CRITICAL CARE Performed by: Delman Kitten   Total critical care time: 70 minutes  Critical care time was exclusive of separately billable procedures and treating other patients.  Critical care was necessary to treat or prevent imminent or life-threatening deterioration.  Critical care was time spent personally by me on the following activities: development of treatment plan with patient and/or surrogate as well as nursing, discussions with consultants, evaluation of patient's response to treatment, examination of patient, obtaining history from patient or surrogate, ordering and performing treatments and interventions, ordering and review of laboratory studies, ordering and review of radiographic studies, pulse oximetry and re-evaluation of patient's condition.  ____________________________________________   INITIAL IMPRESSION / ASSESSMENT AND PLAN / ED COURSE  Pertinent labs & imaging results that were available during my care of the patient were reviewed by me and considered in my medical decision making (see chart for details).   Patient is felt to mental status concerns for possible  change in contents coming from her feeding tube. Noted to have very severe appearing decubitus ulcers. Additionally, upon return of her labs  severe leukocytosis. Consultation to surgery revealing of an area of concern for infection or abscess with purulent drainage expressible. General surgery advising transfer for neurosurgical consultation, patient previously followed at [facility] neurosurgically  Patient has been covered broadly with antibiotics for concerns of severe sepsis. Fluid resuscitation weight-based administered 30 mL/kg. Patient blood pressures responded well to this, her mentation has slightly improved.  Clinical Course as of Mar 03 1651  Fri Mar 02, 2020  0911 Attempted to call listed emergency contact, son [name].  Phone rings but voicemail box full.  Unable to leave message.  Attempted to call 3 times without answer. Only 1 number lsited to reach him   [MQ]  2263 Delay in antibiotic initiation due to prioritization of care to another critical patient requiring resuscitation.  Consulting with pharmacy and Dr. Ola Spurr of infectious disease now regarding recommendations for antibiotics having reviewed CT, white count has now returned severely elevated.  Consistent with severe sepsis.  Hospitalist paged for admission.  Anticipate also need for probable surgical consultation as well   [MQ]  1232 Reassessment. Patient    [MQ]  1246 General surgery is seeing the patient at this time.   [MQ]  1251 Sepsis reassessment. Patient alert, oriented to self. Cap refill normal in right arm. Appears slighly more alert and somewhat improved with regard to mentation. Lactic acid elevating.  Surgery team at bedside recommending transfer to facilitate neurosurgical consult as they are able to express purulence from the lower border of her wound that may correspond with roughly an L3 region   [MQ]  1408 Continue to await callback from neurosurgery and hospitalist at [facility].   [MQ]    Clinical Course User Index [MQ] Delman Kitten, MD   Case has been discussed with nurse practitioner Army Melia of neurosurgery at [facility] who is  agreeable to having their team and neurosurgery consult on the patient once at [facility] health. Discussed admission with hospitalist Dr. Marva Panda, who accepted the patient in transfer. I also discussed the transfer with the ED physician Dr. Kathrynn Humble  Patient transferred via [facility] to [facility]. Condition very much guarded, seemingly slightly improved with slight improvement in lactic acidosis. Severe sepsis/shock. Suspect source of infection to be related to her chronic and decubitus wounds but further consultation with neurosurgery, admission to hospitalist service at [facility] health is anticipated.  ----------------------------------------- 315 PM on 02/14/2020 -----------------------------------------   Sepsis reassessment completed as well at time of transfer. Patient understanding of the plan to transfer but continues to exhibit altered mental status. Patient son agreeable with transfer and I discussed the case with over the phone. Patient requiring transfer for neurosurgical consultation. Continues to have normal capillary refill in her nailbeds  ED Sepsis - Repeat Assessment   Performed at:      Last Vitals:    Blood pressure (!) 97/49, pulse 97, temperature 98 F (36.7 C), temperature source Oral, resp. rate (!) 25, height 5\' 6"  (1.676 m), weight 88.5 kg, SpO2 97 %.   ____________________________________________   FINAL CLINICAL IMPRESSION(S) / ED DIAGNOSES  Final diagnoses:  Wound check, abscess  Pressure injury of skin, unspecified injury stage, unspecified location  Shock ([place])  Sepsis, due to unspecified organism, unspecified whether acute organ dysfunction present ([place])  Lactic acidosis        Note:  This document was prepared using Dragon voice recognition  software and may include unintentional dictation errors       Delman Kitten, MD 02/22/2020 1654

## 2020-03-02 NOTE — ED Provider Notes (Signed)
I was able to make contact with the patient's son, [name].  He is agreeable understanding of plan to transfer his mother for neurosurgical evaluation, previously she is been to [place]   Delman Kitten, MD 02/24/2020 [phone]

## 2020-03-02 NOTE — ED Notes (Signed)
IV team at bedside 

## 2020-03-02 NOTE — ED Notes (Signed)
Admitting at bedside 

## 2020-03-02 NOTE — Sepsis Progress Note (Signed)
Code sepsis called upon initial presentation at [facility] ED. Bundle complete. Code Sepsis called again with arrival to [facility] ED. Lactic acid remains elevated but has trended down from original draw. Pt has been seen by PCCM and is being admitted to ICU. I will continue to monitor vital signs until she arrives on the unit.

## 2020-03-02 NOTE — H&P (Addendum)
NAME:  Taylor Tyler, MRN:  [id number], DOB:  02-15-1941, LOS: 0 ADMISSION DATE:  02/28/2020, CONSULTATION DATE:  02/26/2020 REFERRING MD:  EDP, CHIEF COMPLAINT:  hypotension   Brief History   79 y.o. F with PMH significant for ESRD on HD, chronic spinal infection s/p multiple debridements, diverticulitis with perforation and partial colectomy in 2015, G-tube who resides at SNF who presented to the ED with concerns that stool was in G-tube.  Found to have spinal infection and transferred to [facility] for neurosurgical evaluation.  Blood pressure was borderline, so PCCM consulted for admission  History of present illness   Taylor Tyler is a 79 y.o. F with extensive PMH including ESRD, Type 2 DM, diverticulitis with perforation and partial colectomy in 2015, G-tube, chronic spinal infections following L-spine decompression and fusion 3 yrs ago with subsequent wound infection and several unsuccessful debridements and wound VAC placements, at least 5 operations.    She resides at [facility] where there was brown substance noted in G-tube, concern for stool, so presented to [facility].   In the ED, pt met sepsis criteria with WBC 39k, lactic acid 7.9 and SBP in the 80's.  CT abdomen/pelvis revealed possible SBO and  7.3 x 3.8 cm fluid collection  in presacral space concerning for abscess.  She was given vancomycin and meropenem along with 3.5 L IV fluids and transferred to [facility] for neurosurgical evaluation.  Her case was evaluated by Dr. Kathyrn Sheriff who felt that further surgical intervention was unlikely to be successful and recommended medical treatment.  General surgery was also consulted and NG tube to be placed.  Blood pressure improved after IV fluids with MAP 65-70.  PCCM consulted for admission.  Prior [facility] 12/2019 positive for Proteus and Citrobacter resistant to cephalosporins   Past Medical History   has a past medical history of Acute blood loss anemia (03/14/2017), Acute renal failure superimposed on stage  3 chronic kidney disease ([place]) (03/14/2017), AKI (acute kidney injury) ([place]) (02/28/2017), Altered mental status (03/29/2017), Anemia, Aortic atherosclerosis ([place]), Arthritis, Bilateral lower extremity edema (03/04/2017), Bradycardia, Chronic kidney disease, CKD (chronic kidney disease) stage 3, GFR 30-59 ml/min (01/15/2017), COVID-19, Dementia ([place]), Diabetes mellitus without complication ([place]), Disease of pancreas, Diverticulitis, Diverticulosis (01/15/2017), DJD (degenerative joint disease), DM (diabetes mellitus), type 2 with renal complications ([place]) (0/53/9767), Elevated ferritin level, Fatty liver, Hypertension, Hypothyroidism, Kidney disease, chronic, stage V (GFR under 15 ml/min) ([place]), Kidney stone, Lethargy (02/28/2017), Obesity, Class III, BMI 40-49.9 (morbid obesity) ([place]) (02/27/2017), Pleural lipoma, Postoperative wound infection (04/02/2017), Spinal stenosis of lumbar region (01/15/2017), Status post lumbar surgery, and Wound healing, delayed.   Significant Hospital Events   7/30 Transfer from [facility] and admit to PCCM  Consults:  Neurosurgery  Procedures:  02/19/2020 R femoral CVC placed by EDP  Significant Diagnostic Tests:  CT abdomen/pelvis:  Interval development of significant proximal small bowel dilatation is noted, with several loops of small bowel seen in large ventral hernia in the pelvis. The possibility of obstruction secondary to this herniated bowel cannot be excluded. There also appears to be a portion of transverse colon extending into large supraumbilical hernia, but it does not appear to be resulting in obstruction.  7.3 x 3.8 cm fluid collection is now seen in presacral space which contains air in its more superior portion, and is concerning for possible abscess or postoperative seroma.  Micro Data:  02/20/2020 BCx2>> 02/02/2020 Wound culture>>  Antimicrobials:  Meropenem 7/30- Vancomycin 7/30-  Interim history/subjective:  Pt somnolent, borderline  low BP but  stable  Objective   Blood pressure (!) 97/53, pulse 88, temperature 98.6 F (37 C), temperature source Oral, resp. rate 19, height '5\' 6"'  (1.676 m), weight 88.5 kg, SpO2 100 %.       No intake or output data in the 24 hours ending 02/14/2020 2112 Filed Weights   02/06/2020 1716  Weight: 88.5 kg   General:  Elderly, well-nourished F, in no acute distress HEENT: MM pink/moist Neuro: somnolent but arousable, tracks with eyes, not answering questions or following commands CV: s1s2 rrr, no m/r/g PULM:  No distress, no wheezing or rhonchi  GI: soft, bsx4 active  Extremities: warm/dry, 2+ edema  Skin: previously noted sacral pressure ulcers by [facility] Problem list     Assessment & Plan:    Severe Sepsis secondary to chronic non-healing spinal wound and likely Pre-sacral abscess Given 30 cc/kg and broad spectrum abx, per neurosurgery no surgical intervention indicated.   -Proceed with Vanc and Meropenem, follow blood and wound cultures -lactic acid down-trending, continue to follow -spoke with EDP, he also spoke IR who was not optimistic that they would be able to adequately drain abscess, did undergo CT guided aspiration by IR in May -not currently requiring pressors, has central line if Levophed need to maintain MAP >65 -Likely very poor prognosis, spoke with pt's son who is aware and wanted aggressive, full code and said he did not want to discuss goals of care or speak with the palliative care team   SBO Seen by surgery, likely poor surgical candidate -NPO, place NG tube -further recommendations pending surgery recs   ESRD  On HD TTS -Has vas cath RUE and will need Nephrology consult, no emergent need for CRRT overnight -follow electrolytes  Type 2 DM -SSI   Dementia -Holding Aricept while NPO  Hypothyroidism -Transition oral Levothyroxine to IV dosing  Best practice:  Diet: NPO Pain/Anxiety/Delirium protocol (if indicated): Morphine VAP protocol  (if indicated): n/a DVT prophylaxis: heparin GI prophylaxis: n/a Glucose control: SSI Mobility: bed rest Code Status: full code Family Communication: son updated  Disposition: ICU  Labs   CBC: Recent Labs  Lab 03/01/2020 0929 03/01/2020 1732  WBC 39.1*  --   NEUTROABS 33.0*  --   HGB 11.3* 12.2  HCT 36.4 36.0  MCV 102.0*  --   PLT 257  --     Basic Metabolic Panel: Recent Labs  Lab 02/22/2020 0929 02/27/2020 1732  NA 135 135  K 4.0 3.5  CL 95*  --   CO2 21*  --   GLUCOSE 134*  --   BUN 36*  --   CREATININE 2.17*  --   CALCIUM 9.0  --    GFR: Estimated Creatinine Clearance: 24.7 mL/min (A) (by C-G formula based on SCr of 2.17 mg/dL (H)). Recent Labs  Lab 02/21/2020 0929 02/18/2020 1129 02/03/2020 1359 02/13/2020 1722  WBC 39.1*  --   --   --   LATICACIDVEN 6.3* 7.9* 6.4* 5.0*    Liver Function Tests: Recent Labs  Lab 02/06/2020 0929  AST 40  ALT 22  ALKPHOS 90  BILITOT 0.7  PROT 5.9*  ALBUMIN 2.0*   No results for input(s): LIPASE, AMYLASE in the last 168 hours. Recent Labs  Lab 02/20/2020 0929  AMMONIA 18    ABG    Component Value Date/Time   PHART 7.448 03/29/2017 2052   PCO2ART 30.7 (L) 03/29/2017 2052   PO2ART 75.0 (L) 03/29/2017 2052   HCO3  22.2 02/14/2020 1732   TCO2 23 02/19/2020 1732   ACIDBASEDEF 2.0 03/29/2017 2052   O2SAT 83.0 02/11/2020 1732     Coagulation Profile: Recent Labs  Lab 02/02/2020 0929  INR 1.6*    Cardiac Enzymes: No results for input(s): CKTOTAL, CKMB, CKMBINDEX, TROPONINI in the last 168 hours.  HbA1C: HbA1c, POC (prediabetic range)  Date/Time Value Ref Range Status  02/23/2018 02:54 PM 5.7 5.7 - 6.4 % Final   Hgb A1c MFr Bld  Date/Time Value Ref Range Status  11/19/2019 11:11 AM 4.7 (L) 4.8 - 5.6 % Final    Comment:    (NOTE) Pre diabetes:          5.7%-6.4% Diabetes:              >6.4% Glycemic control for   <7.0% adults with diabetes   05/31/2019 12:23 AM 5.2 4.8 - 5.6 % Final    Comment:    (NOTE) Pre  diabetes:          5.7%-6.4% Diabetes:              >6.4% Glycemic control for   <7.0% adults with diabetes     CBG: Recent Labs  Lab 02/02/2020 1535  GLUCAP 87    Review of Systems:   Unable to obtain secondary to mental status  Past Medical History  She,  has a past medical history of Acute blood loss anemia (03/14/2017), Acute renal failure superimposed on stage 3 chronic kidney disease ([place]) (03/14/2017), AKI (acute kidney injury) ([place]) (02/28/2017), Altered mental status (03/29/2017), Anemia, Aortic atherosclerosis ([place]), Arthritis, Bilateral lower extremity edema (03/04/2017), Bradycardia, Chronic kidney disease, CKD (chronic kidney disease) stage 3, GFR 30-59 ml/min (01/15/2017), COVID-19, Dementia ([place]), Diabetes mellitus without complication ([place]), Disease of pancreas, Diverticulitis, Diverticulosis (01/15/2017), DJD (degenerative joint disease), DM (diabetes mellitus), type 2 with renal complications ([place]) (03/14/5725), Elevated ferritin level, Fatty liver, Hypertension, Hypothyroidism, Kidney disease, chronic, stage V (GFR under 15 ml/min) ([place]), Kidney stone, Lethargy (02/28/2017), Obesity, Class III, BMI 40-49.9 (morbid obesity) ([place]) (02/27/2017), Pleural lipoma, Postoperative wound infection (04/02/2017), Spinal stenosis of lumbar region (01/15/2017), Status post lumbar surgery, and Wound healing, delayed.   Surgical History    Past Surgical History:  Procedure Laterality Date  . ABDOMINAL EXPOSURE N/A 02/24/2017   Procedure: ABDOMINAL EXPOSURE;  Surgeon: Angelia Mould, MD;  Location: [name];  Service: Vascular;  Laterality: N/A;  . ABDOMINAL HYSTERECTOMY  1987   no h/o abnormal paps   . ANTERIOR LAT LUMBAR FUSION N/A 02/24/2017   Procedure: Lumbar three- five Anterior lateral lumbar interbody fusion;  Surgeon: Ditty, Kevan Ny, MD;  Location: [place];  Service: Neurosurgery;  Laterality: N/A;  L3-5 Anterior lateral lumbar interbody fusion with removal of coflex at L3-4, L4-5    . ANTERIOR LUMBAR FUSION N/A 02/24/2017   Procedure: Stage 1: Lumbar five-Sacral one Anterior lumbar interbody fusion;  Surgeon: Ditty, Kevan Ny, MD;  Location: [place];  Service: Neurosurgery;  Laterality: N/A;  Stage 1: L5-S1 Anterior lumbar interbody fusion  . APPENDECTOMY    . APPLICATION OF ROBOTIC ASSISTANCE FOR SPINAL PROCEDURE N/A 02/26/2017   Procedure: APPLICATION OF ROBOTIC ASSISTANCE FOR SPINAL PROCEDURE;  Surgeon: Ditty, Kevan Ny, MD;  Location: [place];  Service: Neurosurgery;  Laterality: N/A;  . APPLICATION OF WOUND VAC N/A 03/30/2017   Procedure: APPLICATION OF WOUND VAC;  Surgeon: Ditty, Kevan Ny, MD;  Location: [place];  Service: Neurosurgery;  Laterality: N/A;  . APPLICATION OF WOUND VAC N/A 06/23/2019  Procedure: APPLICATION OF WOUND VAC;  Surgeon: Consuella Lose, MD;  Location: [place];  Service: Neurosurgery;  Laterality: N/A;  . AV FISTULA PLACEMENT Left 06/08/2019   Procedure: Arteriovenous (Av) Fistula Creation;  Surgeon: Rosetta Posner, MD;  Location: [place];  Service: Vascular;  Laterality: Left;  . BACK SURGERY  2013  . CATARACT EXTRACTION W/ INTRAOCULAR LENS IMPLANT Right   . CHOLECYSTECTOMY OPEN  1982  . COLON SURGERY  01/26/2014   desc.sigmoid colectomy and ventral hernia repair and splenic flexure mobilization  . COLON SURGERY    . DILATION AND CURETTAGE OF UTERUS    . ESOPHAGOGASTRODUODENOSCOPY  12/24/2019   normal with PEG placement Dr. Alice Reichert  . HERNIA REPAIR    . INSERTION OF DIALYSIS CATHETER Right 06/08/2019   Procedure: INSERTION OF DIALYSIS CATHETER RIGHT INTERNAL JUGULAR;  Surgeon: Rosetta Posner, MD;  Location: [place];  Service: Vascular;  Laterality: Right;  . LUMBAR LAMINECTOMY WITH SPINOUS PROCESS PLATE 2 LEVEL N/A 10/09/6576   Procedure: LUMBAR LAMINECTOMY/DECOMPRESSION MICRODISCECTOMY CoFlex;  Surgeon: Faythe Ghee, MD;  Location: [facility];  Service: Neurosurgery;  Laterality: N/A;  Lumbar three-four,Lumbar Four-Five Laminectomy  with Coflex  . LUMBAR WOUND DEBRIDEMENT N/A 03/30/2017   Procedure: Lumbar wound exploration/debridement, placement of wound vac;  Surgeon: Ditty, Kevan Ny, MD;  Location: [place];  Service: Neurosurgery;  Laterality: N/A;  Lumbar wound exploration/debridement, placement of wound vac  . LUMBAR WOUND DEBRIDEMENT N/A 06/06/2018   Procedure: LUMBAR WOUND DEBRIDEMENT/EXPLORATION;  Surgeon: Consuella Lose, MD;  Location: [place];  Service: Neurosurgery;  Laterality: N/A;  . LUMBAR WOUND DEBRIDEMENT N/A 06/22/2018   Procedure: SIMPLE INCISION AND DRAINAGE OF WOUND, APPLICATION OF WOUND VAC;  Surgeon: Consuella Lose, MD;  Location: [place];  Service: Neurosurgery;  Laterality: N/A;  . LUMBAR WOUND DEBRIDEMENT N/A 06/23/2019   Procedure: LUMBAR WOUND DEBRIDEMENT WITH HARDWARE REMOVAL;  Surgeon: Consuella Lose, MD;  Location: [place];  Service: Neurosurgery;  Laterality: N/A;  . PARTIAL COLECTOMY     01/27/14 diverticulitis and 3 benign lymph nodes [facility] Dr. Pat Patrick   . PEG PLACEMENT N/A 12/24/2019   Procedure: PERCUTANEOUS ENDOSCOPIC GASTROSTOMY (PEG) PLACEMENT;  Surgeon: Toledo, [name], MD;  Location: [facility];  Service: Gastroenterology;  Laterality: N/A;  . REDUCTION MAMMAPLASTY Bilateral 1992  . THROMBECTOMY BRACHIAL ARTERY Left 06/08/2019   Procedure: Thrombectomy Brachial Artery;  Surgeon: Rosetta Posner, MD;  Location: [place];  Service: Vascular;  Laterality: Left;  . TUBAL LIGATION    . VENTRAL HERNIA REPAIR  2014     Social History   reports that she has never smoked. She has never used smokeless tobacco. She reports previous alcohol use. She reports that she does not use drugs.   Family History   Her family history includes Diabetes in her brother, brother, father, and mother; Heart attack in her father; Heart disease in her brother, brother, father, and mother; Hypertension in her father and mother; Kidney disease in her father and mother; Lupus in her sister and sister.    Allergies Allergies  Allergen Reactions  . Clams [Shellfish Allergy] Swelling and Other (See Comments)    THROAT SWELLS NECK TURNS RED  . Hydralazine Other (See Comments)    CHEST TIGHTNESS Patient has tolerated multiple doses of hydralazine since allergy was listed   . Norvasc [Amlodipine Besylate] Swelling    Leg edema   . Milk-Related Compounds Diarrhea     Home Medications  Prior to Admission medications   Medication Sig Start  Date End Date Taking? Authorizing Provider  acetaminophen (TYLENOL) 500 MG tablet Take 500 mg by mouth daily as needed for mild pain.    Yes [provider]  Amino Acids-Protein Hydrolys (FEEDING SUPPLEMENT, PRO-STAT SUGAR FREE 64,) LIQD Place 30 mLs into feeding tube 2 (two) times daily. 12/30/19  Yes Jennye Boroughs, MD  atorvastatin (LIPITOR) 40 MG tablet Take 1 tablet (40 mg total) by mouth daily at 6 PM. 09/03/18  Yes McLean-Scocuzza, Nino Glow, MD  B Complex-C (B-COMPLEX WITH VITAMIN C) tablet Place 1 tablet into feeding tube at bedtime. 12/30/19  Yes Jennye Boroughs, MD  ciprofloxacin (CIPRO) 500 MG tablet Take 500 mg by mouth 2 (two) times daily. 02/28/20 03/16/20 Yes [provider]  citalopram (CELEXA) 20 MG tablet Take 20 mg by mouth daily.    Yes [provider]  collagenase (SANTYL) ointment Apply topically daily. 11/24/19  Yes Nicole Kindred A, DO  divalproex (DEPAKOTE SPRINKLE) 125 MG capsule Take 125 mg by mouth 2 (two) times daily.    Yes [provider]  donepezil (ARICEPT) 5 MG tablet Take 5 mg by mouth at bedtime.   Yes [provider]  folic acid (FOLVITE) 253 MCG tablet Take 400 mcg by mouth daily.    Yes [provider]  gabapentin (NEURONTIN) 100 MG capsule Take 2 capsules (200 mg total) by mouth at bedtime. 07/04/19  Yes Regalado, Belkys A, MD  HYDROcodone-acetaminophen (NORCO/VICODIN) 5-325 MG tablet Take 1 tablet by mouth every 6 (six) hours as needed for moderate pain or severe pain.    Yes [provider]  Lactobacillus (ACIDOPHILUS PO) Take 175 mg by mouth 2 (two) times daily.   Yes [provider]  lactulose, encephalopathy, (GENERLAC) 10 GM/15ML SOLN Take 10 g by mouth daily.   Yes [provider]  levothyroxine (SYNTHROID, LEVOTHROID) 100 MCG tablet Take 100 mcg by mouth daily before breakfast.   Yes [provider]  meclizine (ANTIVERT) 12.5 MG tablet Take 1 tablet (12.5 mg total) by mouth daily as needed for dizziness. 11/09/19  Yes Enzo Bi, MD  memantine (NAMENDA) 5 MG tablet Take 5 mg by mouth 2 (two) times daily.   Yes [provider]  midodrine (PROAMATINE) 5 MG tablet Place 1 tablet (5 mg total) into feeding tube 3 (three) times daily with meals. 12/30/19  Yes Jennye Boroughs, MD  Nutritional Supplements (FEEDING SUPPLEMENT, JEVITY 1.5 CAL/FIBER,) LIQD Place 1,000 mLs into feeding tube continuous. 12/30/19  Yes Jennye Boroughs, MD  risperiDONE (RISPERDAL) 0.25 MG tablet Take 0.25 mg by mouth daily.    Yes [provider]  sulfamethoxazole-trimethoprim (BACTRIM DS) 800-160 MG tablet Take 1 tablet by mouth 2 (two) times daily.   Yes [provider]  traMADol (ULTRAM) 50 MG tablet Take 50 mg by mouth every 6 (six) hours as needed for moderate pain.   Yes [provider]  epoetin alfa (EPOGEN) 4000 UNIT/ML injection Inject 1 mL (4,000 Units total) into the skin Every Tuesday,Thursday,and Saturday with dialysis. Patient not taking: Reported on 02/27/2020 11/09/19   Enzo Bi, MD     Critical care time: 50 minutes       CRITICAL CARE Performed by: Otilio Carpen Jones Viviani   Total critical care time: 50 minutes  Critical care time was exclusive of separately billable procedures and treating other patients.  Critical care was necessary to treat or prevent imminent or life-threatening deterioration.  Critical care was time spent personally by me on the following activities: development of treatment  plan with  patient and/or surrogate as well as nursing, discussions with consultants, evaluation of patient's response to treatment, examination of patient, obtaining history from patient or surrogate, ordering and performing treatments and interventions, ordering and review of laboratory studies, ordering and review of radiographic studies, pulse oximetry and re-evaluation of patient's condition.   Otilio Carpen Matej [place], PA-C

## 2020-03-02 NOTE — ED Triage Notes (Signed)
Pt comes EMS from [facility] with increased AMS and what appears to be stool or dark brown substance coming from her g tube. Pt CBG 143. VSS with EMS. Pt appears sleepy but is arousable to voice.

## 2020-03-02 NOTE — Consult Note (Addendum)
[facility] Nurse Consult Note: Pt is familiar to [facility] team from previous admission on 12/20/19. Reason for Consult: Consult requested for sacrum and middle back wounds.   Wound type: Middle back with chronic full thickness wound; 8X5X.5cm; red and moist, mod amt thick tan pus draining from a pinhole opening at 6:00 o'clock, no odor, bone palpable Sacrum with chronic stage 4 pressure injury; 9X7X3cm, beefy red, mod amt pink drainage, no odor, bone palpable Pressure Injury POA: Yes Dressing procedure/placement/frequency: Pt is currently in the ED;  CT results indicate, "7.3 x 3.8 cm fluid collection is now seen in presacral space which contains air in its more superior portion, and is concerning for possible abscess or postoperative seroma." Iinformed physician of wound with pus and probable source of sepsis.  Topical treatment orders provided for bedside nurses to perform Q day as follows to absorb drainage and promote healing: Apply Aquacel packing to middle back wound Q day, then cover with foam dressing.  (Change foam dressing Q 3 days or PRN soiling.) Apply moist gauze packing to sacrum  wound Q day, then cover with foam dressing. (Change foam dressing Q 3 days or PRN soiling.) Please re-consult if further assistance is needed.  Thank-you,  Julien Girt MSN, [place], [place], [place], [place]

## 2020-03-02 NOTE — ED Notes (Signed)
Admitting MD at bedside.

## 2020-03-02 NOTE — ED Notes (Signed)
Pt unable to sign for transfer due to confusion. Dr. Jacqualine Code spoke with son about transfer.

## 2020-03-02 NOTE — ED Triage Notes (Signed)
Pt BIB [facility] from [facility]. Pt coming in for sepsis. Pt had back surgery and has two abscess' that are draining pus. Pt VSS. NAD.

## 2020-03-02 NOTE — Consult Note (Addendum)
[facility] SURGICAL CONSULTATION NOTE (initial) - cpt: [phone]   HISTORY OF PRESENT ILLNESS (HPI):  Patient with a history of dementia and essentially non verbal, no family at bedside, history obtained primarily through chart review and discussion with medical team.   79 y.o. female presented to [facility] ED today via EMS from [facility] for altered mental status. Reportedly, patient had decline in her baseline mental status and there was concern that "fecal matter" was coming from her gastric tube. Additionally, she has a history of chronic sacral decubitus ulceration and lumbar wound s/p lumbar fusion and subsequent debridements at [facility]. Further history is limited. Work up in the ED was concerning for leukocytosis to 39.1K, lactic acidosis to 7.9, acute on chronic AKI with sCr - 2.17, and CT was concerning for dilated loops of small bowel , large known ventral hernia, and fluid collection in the presacral space. Given significant concern for sepsis she was admitted to the medicine service. She was evaluated in the ED by [facility] RN and there was concern that there was purulent drainage from her midline lumbar wound.   Surgery is consulted by emergency medicine physician Dr. Delman Kitten, MD in this context for evaluation and management of sacral decubitus ulcer, midline lumbar wound, and small bowel dilation.   PAST MEDICAL HISTORY (PMH):  Past Medical History:  Diagnosis Date  . Acute blood loss anemia 03/14/2017  . Acute renal failure superimposed on stage 3 chronic kidney disease ([place]) 03/14/2017  . AKI (acute kidney injury) ([place]) 02/28/2017  . Altered mental status 03/29/2017  . Anemia   . Aortic atherosclerosis ([place])   . Arthritis    "joints might ache at times; not that bad" (06/10/2018)  . Bilateral lower extremity edema 03/04/2017  . Bradycardia   . Chronic kidney disease    ?? renal insufficiency,   . CKD (chronic kidney disease) stage 3, GFR 30-59 ml/min 01/15/2017   ??  renal insufficiency, which she thinks is coming from "all these medications"  . COVID-19    10/17/19  . Dementia ([place])   . Diabetes mellitus without complication ([place])    diagnosed 4-5 yrs ago, 06/21/18- "that was years ago"  . Disease of pancreas   . Diverticulitis    s/p perforation and partial colectomy 01/27/14 with 3 benign lymph nodes   . Diverticulosis 01/15/2017  . DJD (degenerative joint disease)   . DM (diabetes mellitus), type 2 with renal complications ([place]) 2/50/5397  . Elevated ferritin level   . Fatty liver   . Hypertension   . Hypothyroidism    "had radiation" (06/10/2018)  . Kidney disease, chronic, stage V (GFR under 15 ml/min) ([place])    per notes from nursing home-she is on dialysis  . Kidney stone   . Lethargy 02/28/2017  . Obesity, Class III, BMI 40-49.9 (morbid obesity) ([place]) 02/27/2017  . Pleural lipoma   . Postoperative wound infection 04/02/2017  . Spinal stenosis of lumbar region 01/15/2017  . Status post lumbar surgery   . Wound healing, delayed    back     PAST SURGICAL HISTORY ([place]):  Past Surgical History:  Procedure Laterality Date  . ABDOMINAL EXPOSURE N/A 02/24/2017   Procedure: ABDOMINAL EXPOSURE;  Surgeon: Angelia Mould, MD;  Location: [place];  Service: Vascular;  Laterality: N/A;  . ABDOMINAL HYSTERECTOMY  1987   no h/o abnormal paps   . ANTERIOR LAT LUMBAR FUSION N/A 02/24/2017   Procedure: Lumbar three- five Anterior lateral lumbar interbody fusion;  Surgeon: Ditty, Kevan Ny, MD;  Location: [place];  Service: Neurosurgery;  Laterality: N/A;  L3-5 Anterior lateral lumbar interbody fusion with removal of coflex at L3-4, L4-5  . ANTERIOR LUMBAR FUSION N/A 02/24/2017   Procedure: Stage 1: Lumbar five-Sacral one Anterior lumbar interbody fusion;  Surgeon: Ditty, Kevan Ny, MD;  Location: [place];  Service: Neurosurgery;  Laterality: N/A;  Stage 1: L5-S1 Anterior lumbar interbody fusion  . APPENDECTOMY    . APPLICATION OF ROBOTIC ASSISTANCE  FOR SPINAL PROCEDURE N/A 02/26/2017   Procedure: APPLICATION OF ROBOTIC ASSISTANCE FOR SPINAL PROCEDURE;  Surgeon: Ditty, Kevan Ny, MD;  Location: [place];  Service: Neurosurgery;  Laterality: N/A;  . APPLICATION OF WOUND VAC N/A 03/30/2017   Procedure: APPLICATION OF WOUND VAC;  Surgeon: Ditty, Kevan Ny, MD;  Location: [place];  Service: Neurosurgery;  Laterality: N/A;  . APPLICATION OF WOUND VAC N/A 06/23/2019   Procedure: APPLICATION OF WOUND VAC;  Surgeon: Consuella Lose, MD;  Location: [place];  Service: Neurosurgery;  Laterality: N/A;  . AV FISTULA PLACEMENT Left 06/08/2019   Procedure: Arteriovenous (Av) Fistula Creation;  Surgeon: Rosetta Posner, MD;  Location: [place];  Service: Vascular;  Laterality: Left;  . BACK SURGERY  2013  . CATARACT EXTRACTION W/ INTRAOCULAR LENS IMPLANT Right   . CHOLECYSTECTOMY OPEN  1982  . COLON SURGERY  01/26/2014   desc.sigmoid colectomy and ventral hernia repair and splenic flexure mobilization  . COLON SURGERY    . DILATION AND CURETTAGE OF UTERUS    . ESOPHAGOGASTRODUODENOSCOPY  12/24/2019   normal with PEG placement Dr. Alice Reichert  . HERNIA REPAIR    . INSERTION OF DIALYSIS CATHETER Right 06/08/2019   Procedure: INSERTION OF DIALYSIS CATHETER RIGHT INTERNAL JUGULAR;  Surgeon: Rosetta Posner, MD;  Location: [place];  Service: Vascular;  Laterality: Right;  . LUMBAR LAMINECTOMY WITH SPINOUS PROCESS PLATE 2 LEVEL N/A 9/38/1017   Procedure: LUMBAR LAMINECTOMY/DECOMPRESSION MICRODISCECTOMY CoFlex;  Surgeon: Faythe Ghee, MD;  Location: [facility];  Service: Neurosurgery;  Laterality: N/A;  Lumbar three-four,Lumbar Four-Five Laminectomy with Coflex  . LUMBAR WOUND DEBRIDEMENT N/A 03/30/2017   Procedure: Lumbar wound exploration/debridement, placement of wound vac;  Surgeon: Ditty, Kevan Ny, MD;  Location: [place];  Service: Neurosurgery;  Laterality: N/A;  Lumbar wound exploration/debridement, placement of wound vac  . LUMBAR WOUND DEBRIDEMENT N/A  06/06/2018   Procedure: LUMBAR WOUND DEBRIDEMENT/EXPLORATION;  Surgeon: Consuella Lose, MD;  Location: [name];  Service: Neurosurgery;  Laterality: N/A;  . LUMBAR WOUND DEBRIDEMENT N/A 06/22/2018   Procedure: SIMPLE INCISION AND DRAINAGE OF WOUND, APPLICATION OF WOUND VAC;  Surgeon: Consuella Lose, MD;  Location: [place];  Service: Neurosurgery;  Laterality: N/A;  . LUMBAR WOUND DEBRIDEMENT N/A 06/23/2019   Procedure: LUMBAR WOUND DEBRIDEMENT WITH HARDWARE REMOVAL;  Surgeon: Consuella Lose, MD;  Location: [place];  Service: Neurosurgery;  Laterality: N/A;  . PARTIAL COLECTOMY     01/27/14 diverticulitis and 3 benign lymph nodes [facility] Dr. Pat Patrick   . PEG PLACEMENT N/A 12/24/2019   Procedure: PERCUTANEOUS ENDOSCOPIC GASTROSTOMY (PEG) PLACEMENT;  Surgeon: Toledo, [name], MD;  Location: [facility];  Service: Gastroenterology;  Laterality: N/A;  . REDUCTION MAMMAPLASTY Bilateral 1992  . THROMBECTOMY BRACHIAL ARTERY Left 06/08/2019   Procedure: Thrombectomy Brachial Artery;  Surgeon: Rosetta Posner, MD;  Location: [place];  Service: Vascular;  Laterality: Left;  . TUBAL LIGATION    . VENTRAL HERNIA REPAIR  2014     MEDICATIONS:  Prior to Admission medications  Medication Sig Start Date End Date Taking? Authorizing Provider  acetaminophen (TYLENOL) 500 MG tablet Take 500 mg by mouth daily as needed for mild pain.     [provider]  Amino Acids-Protein Hydrolys (FEEDING SUPPLEMENT, PRO-STAT SUGAR FREE 64,) LIQD Place 30 mLs into feeding tube 2 (two) times daily. 12/30/19   Jennye Boroughs, MD  atorvastatin (LIPITOR) 40 MG tablet Take 1 tablet (40 mg total) by mouth daily at 6 PM. 09/03/18   McLean-Scocuzza, Nino Glow, MD  B Complex-C (B-COMPLEX WITH VITAMIN C) tablet Place 1 tablet into feeding tube at bedtime. 12/30/19   Jennye Boroughs, MD  citalopram (CELEXA) 10 MG tablet Take 10 mg by mouth daily.    [provider]  collagenase (SANTYL) ointment Apply topically daily. 11/24/19    Nicole Kindred A, DO  divalproex (DEPAKOTE SPRINKLE) 125 MG capsule Take 125 mg by mouth 2 (two) times daily.     [provider]  docusate sodium (COLACE) 100 MG capsule Take 100 mg by mouth 2 (two) times daily.    [provider]  donepezil (ARICEPT) 5 MG tablet Take 5 mg by mouth at bedtime.    [provider]  epoetin alfa (EPOGEN) 4000 UNIT/ML injection Inject 1 mL (4,000 Units total) into the skin Every Tuesday,Thursday,and Saturday with dialysis. 11/09/19   Enzo Bi, MD  folic acid (FOLVITE) 778 MCG tablet Take 400 mcg by mouth daily.     [provider]  gabapentin (NEURONTIN) 100 MG capsule Take 2 capsules (200 mg total) by mouth at bedtime. 07/04/19   Regalado, Belkys A, MD  Lactobacillus (ACIDOPHILUS PO) Take 175 mg by mouth 2 (two) times daily.    [provider]  lactulose, encephalopathy, (GENERLAC) 10 GM/15ML SOLN Take 10 g by mouth daily.    [provider]  levothyroxine (SYNTHROID, LEVOTHROID) 100 MCG tablet Take 100 mcg by mouth daily before breakfast.    [provider]  meclizine (ANTIVERT) 12.5 MG tablet Take 1 tablet (12.5 mg total) by mouth daily as needed for dizziness. 11/09/19   Enzo Bi, MD  memantine (NAMENDA) 5 MG tablet Take 5 mg by mouth 2 (two) times daily.    [provider]  midodrine (PROAMATINE) 5 MG tablet Place 1 tablet (5 mg total) into feeding tube 3 (three) times daily with meals. 12/30/19   Jennye Boroughs, MD  Nutritional Supplements (FEEDING SUPPLEMENT, JEVITY 1.5 CAL/FIBER,) LIQD Place 1,000 mLs into feeding tube continuous. 12/30/19   Jennye Boroughs, MD  Nutritional Supplements (FEEDING SUPPLEMENT, NEPRO CARB STEADY,) LIQD Take 237 mLs by mouth daily.    [provider]  risperiDONE (RISPERDAL) 0.25 MG tablet Take 0.25 mg by mouth 2 (two) times daily.    [provider]  sevelamer carbonate (RENVELA) 800 MG tablet Take 1 tablet (800 mg total) by mouth 3 (three) times  daily with meals. 07/04/19   Regalado, Belkys A, MD  sulfamethoxazole-trimethoprim (BACTRIM) 400-80 MG tablet Place 1 tablet into feeding tube every 12 (twelve) hours. 12/30/19   Jennye Boroughs, MD  zinc oxide 20 % ointment Apply 1 application topically in the morning, at noon, and at bedtime.    [provider]     ALLERGIES:  Allergies  Allergen Reactions  . Clams [Shellfish Allergy] Swelling and Other (See Comments)    THROAT SWELLS NECK TURNS RED  . Hydralazine Other (See Comments)    CHEST TIGHTNESS Patient has tolerated multiple doses of hydralazine since allergy was listed   . Norvasc [Amlodipine  Besylate] Swelling    Leg edema   . Milk-Related Compounds Diarrhea     SOCIAL HISTORY:  Social History   Socioeconomic History  . Marital status: Widowed    Spouse name: Not on file  . Number of children: Not on file  . Years of education: Not on file  . Highest education level: Not on file  Occupational History  . Occupation: retired Economist work  Tobacco Use  . Smoking status: Never Smoker  . Smokeless tobacco: Never Used  Vaping Use  . Vaping Use: Never used  Substance and Sexual Activity  . Alcohol use: Not Currently    Comment: "nothing since age 89" (06/10/2018)  . Drug use: Never  . Sexual activity: Not Currently  Other Topics Concern  . Not on file  Social History Narrative   Admitted to [facility] 03/03/17   Widowed since 2012    2 sons [name] comes to appts)    Never smoked   Alcohol none   Full Code   12th grade education retired    Occupational hygienist    Social Determinants of Radio broadcast assistant Strain:   . Difficulty of Paying Living Expenses:   Food Insecurity:   . Worried About Charity fundraiser in the Last Year:   . Arboriculturist in the Last Year:   Transportation Needs:   . Film/video editor (Medical):   Marland Kitchen Lack of Transportation (Non-Medical):   Physical Activity:   . Days of Exercise per Week:   . Minutes  of Exercise per Session:   Stress:   . Feeling of Stress :   Social Connections:   . Frequency of Communication with Friends and Family:   . Frequency of Social Gatherings with Friends and Family:   . Attends Religious Services:   . Active Member of Clubs or Organizations:   . Attends Archivist Meetings:   Marland Kitchen Marital Status:   Intimate Partner Violence:   . Fear of Current or Ex-Partner:   . Emotionally Abused:   Marland Kitchen Physically Abused:   . Sexually Abused:      FAMILY HISTORY:  Family History  Problem Relation Age of Onset  . Diabetes Father   . Heart attack Father   . Hypertension Father   . Heart disease Father   . Kidney disease Father   . Diabetes Mother   . Hypertension Mother   . Heart disease Mother   . Kidney disease Mother   . Lupus Sister   . Heart disease Brother   . Diabetes Brother   . Heart disease Brother   . Diabetes Brother   . Lupus Sister       REVIEW OF SYSTEMS:  Review of Systems  Unable to perform ROS: Dementia  Skin:       + Sacral wound + Midline Lumbar Wound    VITAL SIGNS:  Temp:  [98.1 F (36.7 C)] 98.1 F (36.7 C) (07/30 0843) Pulse Rate:  [92-97] 92 (07/30 1200) Resp:  [22-27] 27 (07/30 1200) BP: (103-143)/(53-63) 103/53 (07/30 1200) SpO2:  [96 %-100 %] 100 % (07/30 1200) Weight:  [88.5 kg] 88.5 kg (07/30 0844)     Height: 5\' 6"  (167.6 cm) Weight: 88.5 kg BMI (Calculated): 31.51   INTAKE/OUTPUT:  No intake/output data recorded.  PHYSICAL EXAM:  Physical Exam Constitutional:      Appearance: Normal appearance. She is obese.     Comments: Patient resting in bed,  opens eyes and tracks people in room, non-communicative   HENT:     Head: Normocephalic and atraumatic.  Eyes:     General: No scleral icterus.    Conjunctiva/sclera: Conjunctivae normal.  Cardiovascular:     Rate and Rhythm: Normal rate and regular rhythm.     Pulses: Normal pulses.  Pulmonary:     Effort: Pulmonary effort is normal. No respiratory  distress.     Breath sounds: Normal breath sounds.  Abdominal:     General: A surgical scar is present. There is distension.     Palpations: Abdomen is soft.     Tenderness: There is no guarding or rebound.     Hernia: A hernia is present. Hernia is present in the ventral area.       Comments: Abdomen is soft, unable to appreciate tenderness but limited by mental status, mild distension, large known ventral hernia containing loops of bowel, G-tube in the LUQ from which we vented ~1.5L of gastric contents  Genitourinary:    Comments: deferred Musculoskeletal:     Right lower leg: Edema present.     Left lower leg: Edema present.  Skin:    General: Skin is warm and dry.       Neurological:     Mental Status: She is alert.     Comments: Unable to reliably assess   Psychiatric:     Comments: Unable to reliably assess     Lumbar Wound (02/19/2020):      Labs:  CBC Latest Ref Rng & Units 02/14/2020 12/29/2019 12/24/2019  WBC 4.0 - 10.5 K/uL 39.1(H) 7.9 -  Hemoglobin 12.0 - 15.0 g/dL 11.3(L) 8.2(L) 10.1(L)  Hematocrit 36 - 46 % 36.4 27.3(L) 34.4(L)  Platelets 150 - 400 K/uL 257 166 -   CMP Latest Ref Rng & Units 02/11/2020 12/29/2019 12/25/2019  Glucose 70 - 99 mg/dL 134(H) 153(H) 85  BUN 8 - 23 mg/dL 36(H) 45(H) 27(H)  Creatinine 0.44 - 1.00 mg/dL 2.17(H) 1.93(H) 1.52(H)  Sodium 135 - 145 mmol/L 135 138 140  Potassium 3.5 - 5.1 mmol/L 4.0 4.1 4.1  Chloride 98 - 111 mmol/L 95(L) 99 103  CO2 22 - 32 mmol/L 21(L) 31 28  Calcium 8.9 - 10.3 mg/dL 9.0 7.8(L) 8.0(L)  Total Protein 6.5 - 8.1 g/dL 5.9(L) - -  Total Bilirubin 0.3 - 1.2 mg/dL 0.7 - -  Alkaline Phos 38 - 126 U/L 90 - -  AST 15 - 41 U/L 40 - -  ALT 0 - 44 U/L 22 - -     Imaging studies:   CT Abdomen/Pelvis (02/21/2020) personally reviewed which shows markedly dilated stomach and loops of small bowel, large known ventral hernia, sacral fluid collection, and known sacral and lumbar wounds, and radiologist report  reviewed:  IMPRESSION: 1. Interval development of significant proximal small bowel dilatation is noted, with several loops of small bowel seen in large ventral hernia in the pelvis. The possibility of obstruction secondary to this herniated bowel cannot be excluded. 2. Significant enlargement of decubitus ulcer which is seen primarily just inferior to the sacrum posteriorly. 3. There also appears to be a portion of transverse colon extending into large supraumbilical hernia, but it does not appear to be resulting in obstruction. 4. 7.3 x 3.8 cm fluid collection is now seen in presacral space which contains air in its more superior portion, and is concerning for possible abscess or postoperative seroma. 5. Extensive postsurgical changes are seen involving the lumbar spine, including laminectomies of  L4, L5 and S1. Patient appears to have undergone intrapedicular screw removal at the L3, L4 and L5 levels. There is a large fluid collection with air-fluid level in the soft tissues posterior to the lower lumbar spine extending from L3-S1. This is not significantly changed compared to prior exam. 6. Nonobstructive left renal calculus. No hydronephrosis or renal obstruction is noted.   Assessment/Plan: (ICD-10's: A41.9) 79 y.o. female with sepsis with unknown source however given purulent drainage from midline lumbar wound with prior history of lumbar fusion and subsequent infection, the most concerning source would be infected hardware. Urosepsis is possible but she is on dialysis and does not make much urine. Small bowel dilation most likely represents ileus in the setting of sepsis.   - Agree with plan for transfer to [facility] where she can be evaluated by neurosurgery given high suspicion for infected hardware as source of sepsis  - Continue Abx    - Recommend venting gastric tube/placing to DD to help decompress stomach/bowel  - no general surgery intervention   - In the interim, back  midline wound dressed with saline moistened gauze or calcium alginate, cover   - further management per primary service(s)   All of the above findings and recommendations were discussed with the medical team  Thank you for the opportunity to participate in this patient's care.   -- Edison Simon, PA-C [facility] 02/05/2020, 12:24 PM [phone] M-F: 7am - 4pm  I saw and evaluated the patient.  I agree with the above documentation, exam, and plan, which I have edited where appropriate. Fredirick Maudlin  2:47 PM

## 2020-03-02 NOTE — Progress Notes (Signed)
Mrs. Lurena Naeve is known to me from multiple previous encounters.  Briefly, she initially underwent lumbar instrumented decompression and fusion approximately 3 years ago by a previous partner.  Unfortunately, she developed wound infection which was initially debrided with placement of a wound VAC.  Unfortunately, her wound remained unhealed, with intermittent drainage.  I took over her care and initially performed another irrigation and debridement without success.  We subsequently placed a wound VAC which was removed without success.  Subsequent to that, I removed the previously implanted hardware and again placed a wound VAC.  Unfortunately, the wound VAC was removed but her wound never healed.  I had last seen her in outpatient follow-up about 4 months ago at which time we discussed treatment options for her nonhealing chronic lumbar wound.  At that time she was adamantly against replacement of the wound VAC.  At this point, it appears that there is no further surgical option in the treatment of this chronic nonhealing wound.  She has undergone at least five previous operations without success.  I certainly do not feel that a sixth operation is likely to be successful.  I think that medical treatment if she is septic is certainly reasonable, as is consideration of palliative care consult as this seems to be a chronic problem which has failed all reasonable medical and surgical treatment and it appears her overall condition continues to decline.  Consuella Lose, MD [facility]

## 2020-03-02 NOTE — ED Notes (Signed)
Pt swelling/hernia in abdomen appears to be getting bigger. Pt continues to be lethargic but arousable to voice and follows commands. Antibiotics started. MD aware.

## 2020-03-02 NOTE — Progress Notes (Signed)
PHARMACY -  BRIEF ANTIBIOTIC NOTE   Pharmacy has received consult(s) for meropenem and vancomycin from an ED provider.  The patient's profile has been reviewed for ht/wt/allergies/indication/available labs.    One time order(s) placed for Vancomycin 1750 mg IV  And meropenem 1g IV   Further antibiotics/pharmacy consults should be ordered by admitting physician if indicated.                       Thank you, Pernell Dupre, PharmD, BCPS Clinical Pharmacist 02/19/2020 11:50 AM

## 2020-03-02 NOTE — ED Provider Notes (Addendum)
[place] EMERGENCY DEPARTMENT Provider Note   CSN: [id number] Arrival date & time: 03/03/2020  1700     History Chief Complaint  Patient presents with  . Blood Infection    Taylor Tyler is a 79 y.o. female.  HPI    79 year old female comes in a chief complaint of infection. 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  Level 5 caveat for altered mental status.  Patient transferred here from [facility] emergency room for neurosurgical evaluation.  Patient had gone to [facility] regional for issues with her G-tube.  Apparently she is having fecal material come out.  While over there they noted that she was also having deep space infection.  CT scans were completed with revealed that patient has large pressure ulcers with fluid collection that might be tracking up towards her spine.  Surgery team at [facility] recommended neurosurgical evaluation therefore patient was sent to the ED at [facility].  At the [facility] regional ER patient had received broad-spectrum antibiotics and 3 L of fluid. EMS gave 500 cc fluid on top of that.  I discussed case with patient's son.  He reports that patient had similar episode in May.  At that time she received IV antibiotics and improved.  He was unaware of the spine infection issue, the facility had informed him that they were concerned about the feeding tube.  He thinks that patient might not have been receiving her antibiotics appropriately.  Past Medical History:  Diagnosis Date  . Acute blood loss anemia 03/14/2017  . Acute renal failure superimposed on stage 3 chronic kidney disease ([place]) 03/14/2017  . AKI (acute kidney injury) ([place]) 02/28/2017  . Altered mental status 03/29/2017  . Anemia   . Aortic atherosclerosis ([place])   . Arthritis    "joints might ache at times; not that bad" (06/10/2018)  . Bilateral lower extremity edema 03/04/2017  . Bradycardia     . Chronic kidney disease    ?? renal insufficiency,   . CKD (chronic kidney disease) stage 3, GFR 30-59 ml/min 01/15/2017   ?? renal insufficiency, which she thinks is coming from "all these medications"  . COVID-19    10/17/19  . Dementia ([place])   . Diabetes mellitus without complication ([place])    diagnosed 4-5 yrs ago, 06/21/18- "that was years ago"  . Disease of pancreas   . Diverticulitis    s/p perforation and partial colectomy 01/27/14 with 3 benign lymph nodes   . Diverticulosis 01/15/2017  . DJD (degenerative joint disease)   . DM (diabetes mellitus), type 2 with renal complications ([place]) 8/58/8502  . Elevated ferritin level   . Fatty liver   . Hypertension   . Hypothyroidism    "had radiation" (06/10/2018)  . Kidney disease, chronic, stage V (GFR under 15 ml/min) ([place])    per notes from nursing home-she is on dialysis  . Kidney stone   . Lethargy 02/28/2017  . Obesity, Class III, BMI 40-49.9 (morbid obesity) ([place]) 02/27/2017  . Pleural lipoma   . Postoperative wound infection 04/02/2017  . Spinal stenosis of lumbar region 01/15/2017  . Status post lumbar surgery   . Wound healing, delayed    back    Patient Active Problem List   Diagnosis Date Noted  . Severe sepsis ([place]) 02/06/2020  . Depression 02/24/2020  . Chronic diastolic CHF (congestive heart failure) ([place]) 02/20/2020  . Sacral wound 02/22/2020  . SBO (  small bowel obstruction) ([place]) 02/10/2020  . Sepsis ([place]) 02/23/2020  . S/P percutaneous endoscopic gastrostomy (PEG) tube placement ([place]) 12/24/2019  . Failure to thrive in adult   . Osteomyelitis ([place])   . Goals of care, counseling/discussion   . Palliative care by specialist   . DNR (do not resuscitate) discussion   . Sacral decubitus ulcer, stage IV ([place]) 12/12/2019  . Severe sepsis with septic shock ([place]) 12/12/2019  . Encephalopathy acute 12/11/2019  . Wound infection 11/21/2019  . Leukocytosis 11/19/2019  . Acute metabolic encephalopathy 76/73/4193  .  Dementia without behavioral disturbance ([place]) 11/03/2019  . History of anemia due to chronic kidney disease 11/03/2019  . ESRD (end stage renal disease) ([place]) 07/22/2019  . Fever in adult   . Pressure injury of skin 06/08/2019  . Acute CHF (congestive heart failure) ([place]) 05/30/2019  . Infection and inflammatory reaction due to internal fixation device of spine, initial encounter ([place]) 06/05/2018  . Neuropathy 01/08/2018  . Hypokalemia 01/08/2018  . Fatty liver 01/05/2018  . Chronic pain of both knees 01/05/2018  . Ventral hernia 01/05/2018  . CKD (chronic kidney disease) stage 4, GFR 15-29 ml/min (HCC) 07/21/2017  . Vitamin D deficiency 07/21/2017  . Essential hypertension   . Postoperative wound infection 04/02/2017  . Anemia of chronic disease 03/14/2017  . Acute blood loss anemia 03/14/2017  . Acute renal failure superimposed on stage 3 chronic kidney disease ([place]) 03/14/2017  . Bilateral leg edema 03/04/2017  . Obesity, Class III, BMI 40-49.9 (morbid obesity) ([place]) 02/27/2017  . DM (diabetes mellitus), type 2 with renal complications ([place]) 79/09/4095  . Status post lumbar surgery 02/24/2017  . CKD (chronic kidney disease) stage 3, GFR 30-59 ml/min 01/15/2017  . Hypothyroidism 01/15/2017  . Diverticulosis 01/15/2017  . HLD (hyperlipidemia) 01/15/2017  . Spinal stenosis of lumbar region 01/15/2017  . Disease of pancreas 07/21/2012    Past Surgical History:  Procedure Laterality Date  . ABDOMINAL EXPOSURE N/A 02/24/2017   Procedure: ABDOMINAL EXPOSURE;  Surgeon: Angelia Mould, MD;  Location: [place];  Service: Vascular;  Laterality: N/A;  . ABDOMINAL HYSTERECTOMY  1987   no h/o abnormal paps   . ANTERIOR LAT LUMBAR FUSION N/A 02/24/2017   Procedure: Lumbar three- five Anterior lateral lumbar interbody fusion;  Surgeon: Ditty, Kevan Ny, MD;  Location: [place];  Service: Neurosurgery;  Laterality: N/A;  L3-5 Anterior lateral lumbar interbody fusion with removal of  coflex at L3-4, L4-5  . ANTERIOR LUMBAR FUSION N/A 02/24/2017   Procedure: Stage 1: Lumbar five-Sacral one Anterior lumbar interbody fusion;  Surgeon: Ditty, Kevan Ny, MD;  Location: [place];  Service: Neurosurgery;  Laterality: N/A;  Stage 1: L5-S1 Anterior lumbar interbody fusion  . APPENDECTOMY    . APPLICATION OF ROBOTIC ASSISTANCE FOR SPINAL PROCEDURE N/A 02/26/2017   Procedure: APPLICATION OF ROBOTIC ASSISTANCE FOR SPINAL PROCEDURE;  Surgeon: Ditty, Kevan Ny, MD;  Location: [place];  Service: Neurosurgery;  Laterality: N/A;  . APPLICATION OF WOUND VAC N/A 03/30/2017   Procedure: APPLICATION OF WOUND VAC;  Surgeon: Ditty, Kevan Ny, MD;  Location: [place];  Service: Neurosurgery;  Laterality: N/A;  . APPLICATION OF WOUND VAC N/A 06/23/2019   Procedure: APPLICATION OF WOUND VAC;  Surgeon: Consuella Lose, MD;  Location: [place];  Service: Neurosurgery;  Laterality: N/A;  . AV FISTULA PLACEMENT Left 06/08/2019   Procedure: Arteriovenous (Av) Fistula Creation;  Surgeon: Rosetta Posner, MD;  Location: [facility];  Service: Vascular;  Laterality: Left;  . BACK  SURGERY  2013  . CATARACT EXTRACTION W/ INTRAOCULAR LENS IMPLANT Right   . CHOLECYSTECTOMY OPEN  1982  . COLON SURGERY  01/26/2014   desc.sigmoid colectomy and ventral hernia repair and splenic flexure mobilization  . COLON SURGERY    . DILATION AND CURETTAGE OF UTERUS    . ESOPHAGOGASTRODUODENOSCOPY  12/24/2019   normal with PEG placement Dr. Alice Reichert  . HERNIA REPAIR    . INSERTION OF DIALYSIS CATHETER Right 06/08/2019   Procedure: INSERTION OF DIALYSIS CATHETER RIGHT INTERNAL JUGULAR;  Surgeon: Rosetta Posner, MD;  Location: [facility];  Service: Vascular;  Laterality: Right;  . LUMBAR LAMINECTOMY WITH SPINOUS PROCESS PLATE 2 LEVEL N/A 5/36/6440   Procedure: LUMBAR LAMINECTOMY/DECOMPRESSION MICRODISCECTOMY CoFlex;  Surgeon: Faythe Ghee, MD;  Location: [facility];  Service: Neurosurgery;  Laterality: N/A;  Lumbar three-four,Lumbar  Four-Five Laminectomy with Coflex  . LUMBAR WOUND DEBRIDEMENT N/A 03/30/2017   Procedure: Lumbar wound exploration/debridement, placement of wound vac;  Surgeon: Ditty, Kevan Ny, MD;  Location: [place];  Service: Neurosurgery;  Laterality: N/A;  Lumbar wound exploration/debridement, placement of wound vac  . LUMBAR WOUND DEBRIDEMENT N/A 06/06/2018   Procedure: LUMBAR WOUND DEBRIDEMENT/EXPLORATION;  Surgeon: Consuella Lose, MD;  Location: [place];  Service: Neurosurgery;  Laterality: N/A;  . LUMBAR WOUND DEBRIDEMENT N/A 06/22/2018   Procedure: SIMPLE INCISION AND DRAINAGE OF WOUND, APPLICATION OF WOUND VAC;  Surgeon: Consuella Lose, MD;  Location: [place];  Service: Neurosurgery;  Laterality: N/A;  . LUMBAR WOUND DEBRIDEMENT N/A 06/23/2019   Procedure: LUMBAR WOUND DEBRIDEMENT WITH HARDWARE REMOVAL;  Surgeon: Consuella Lose, MD;  Location: [place];  Service: Neurosurgery;  Laterality: N/A;  . PARTIAL COLECTOMY     01/27/14 diverticulitis and 3 benign lymph nodes [facility] Dr. Pat Patrick   . PEG PLACEMENT N/A 12/24/2019   Procedure: PERCUTANEOUS ENDOSCOPIC GASTROSTOMY (PEG) PLACEMENT;  Surgeon: Toledo, [name], MD;  Location: [facility];  Service: Gastroenterology;  Laterality: N/A;  . REDUCTION MAMMAPLASTY Bilateral 1992  . THROMBECTOMY BRACHIAL ARTERY Left 06/08/2019   Procedure: Thrombectomy Brachial Artery;  Surgeon: Rosetta Posner, MD;  Location: [place];  Service: Vascular;  Laterality: Left;  . TUBAL LIGATION    . VENTRAL HERNIA REPAIR  2014     OB History   No obstetric history on file.     Family History  Problem Relation Age of Onset  . Diabetes Father   . Heart attack Father   . Hypertension Father   . Heart disease Father   . Kidney disease Father   . Diabetes Mother   . Hypertension Mother   . Heart disease Mother   . Kidney disease Mother   . Lupus Sister   . Heart disease Brother   . Diabetes Brother   . Heart disease Brother   . Diabetes Brother   . Lupus Sister      Social History   Tobacco Use  . Smoking status: Never Smoker  . Smokeless tobacco: Never Used  Vaping Use  . Vaping Use: Never used  Substance Use Topics  . Alcohol use: Not Currently    Comment: "nothing since age 5" (06/10/2018)  . Drug use: Never    Home Medications Prior to Admission medications   Medication Sig Start Date End Date Taking? Authorizing Provider  acetaminophen (TYLENOL) 500 MG tablet Take 500 mg by mouth daily as needed for mild pain.    Yes [provider]  Amino Acids-Protein Hydrolys (FEEDING SUPPLEMENT, PRO-STAT SUGAR FREE 64,) LIQD Place 30  mLs into feeding tube 2 (two) times daily. 12/30/19  Yes Jennye Boroughs, MD  atorvastatin (LIPITOR) 40 MG tablet Take 1 tablet (40 mg total) by mouth daily at 6 PM. 09/03/18  Yes McLean-Scocuzza, Nino Glow, MD  B Complex-C (B-COMPLEX WITH VITAMIN C) tablet Place 1 tablet into feeding tube at bedtime. 12/30/19  Yes Jennye Boroughs, MD  ciprofloxacin (CIPRO) 500 MG tablet Take 500 mg by mouth 2 (two) times daily. 02/28/20 03/16/20 Yes [provider]  citalopram (CELEXA) 20 MG tablet Take 20 mg by mouth daily.    Yes [provider]  collagenase (SANTYL) ointment Apply topically daily. 11/24/19  Yes Nicole Kindred A, DO  divalproex (DEPAKOTE SPRINKLE) 125 MG capsule Take 125 mg by mouth 2 (two) times daily.    Yes [provider]  donepezil (ARICEPT) 5 MG tablet Take 5 mg by mouth at bedtime.   Yes [provider]  folic acid (FOLVITE) 400 MCG tablet Take 400 mcg by mouth daily.    Yes [provider]  gabapentin (NEURONTIN) 100 MG capsule Take 2 capsules (200 mg total) by mouth at bedtime. 07/04/19  Yes Regalado, Belkys A, MD  HYDROcodone-acetaminophen (NORCO/VICODIN) 5-325 MG tablet Take 1 tablet by mouth every 6 (six) hours as needed for moderate pain or severe pain.   Yes [provider]  Lactobacillus (ACIDOPHILUS PO) Take 175 mg by mouth 2 (two) times daily.   Yes  [provider]  lactulose, encephalopathy, (GENERLAC) 10 GM/15ML SOLN Take 10 g by mouth daily.   Yes [provider]  levothyroxine (SYNTHROID, LEVOTHROID) 100 MCG tablet Take 100 mcg by mouth daily before breakfast.   Yes [provider]  meclizine (ANTIVERT) 12.5 MG tablet Take 1 tablet (12.5 mg total) by mouth daily as needed for dizziness. 11/09/19  Yes Enzo Bi, MD  memantine (NAMENDA) 5 MG tablet Take 5 mg by mouth 2 (two) times daily.   Yes [provider]  midodrine (PROAMATINE) 5 MG tablet Place 1 tablet (5 mg total) into feeding tube 3 (three) times daily with meals. 12/30/19  Yes Jennye Boroughs, MD  Nutritional Supplements (FEEDING SUPPLEMENT, JEVITY 1.5 CAL/FIBER,) LIQD Place 1,000 mLs into feeding tube continuous. 12/30/19  Yes Jennye Boroughs, MD  risperiDONE (RISPERDAL) 0.25 MG tablet Take 0.25 mg by mouth daily.    Yes [provider]  sulfamethoxazole-trimethoprim (BACTRIM DS) 800-160 MG tablet Take 1 tablet by mouth 2 (two) times daily.   Yes [provider]  traMADol (ULTRAM) 50 MG tablet Take 50 mg by mouth every 6 (six) hours as needed for moderate pain.   Yes [provider]  epoetin alfa (EPOGEN) 4000 UNIT/ML injection Inject 1 mL (4,000 Units total) into the skin Every Tuesday,Thursday,and Saturday with dialysis. Patient not taking: Reported on 02/19/2020 11/09/19   Enzo Bi, MD    Allergies    Clams [shellfish allergy], Hydralazine, Norvasc [amlodipine besylate], and Milk-related compounds  Review of Systems   Review of Systems  Unable to perform ROS: Dementia    Physical Exam Updated Vital Signs BP (!) 98/55   Pulse 86   Temp 98.6 F (37 C) (Oral)   Resp 18   Ht 5\' 6"  (1.676 m)   Wt 88.5 kg   SpO2 100%   BMI 31.49 kg/m   Physical Exam Vitals and nursing note reviewed.  Constitutional:      Appearance: She is well-developed.  HENT:     Head: Atraumatic.  Cardiovascular:     Rate and  Rhythm:  Normal rate.  Pulmonary:     Effort: Pulmonary effort is normal.  Abdominal:     General: There is distension.     Tenderness: There is abdominal tenderness.     Hernia: A hernia is present.  Musculoskeletal:     Cervical back: Normal range of motion and neck supple.     Comments: Patient has 2 large sacral pressure ulcers in her back, 1 over the sacrum and one superior to it.  The lesion that is more superior is draining pus.  Skin:    General: Skin is warm and dry.  Neurological:     Mental Status: She is oriented to person, place, and time.     ED Results / Procedures / Treatments   Labs (all labs ordered are listed, but only abnormal results are displayed) Labs Reviewed  C-REACTIVE PROTEIN - Abnormal; Notable for the following components:      Result Value   CRP 20.4 (*)    All other components within normal limits  LACTIC ACID, PLASMA - Abnormal; Notable for the following components:   Lactic Acid, Venous 5.0 (*)    All other components within normal limits  VALPROIC ACID LEVEL - Abnormal; Notable for the following components:   Valproic Acid Lvl <10 (*)    All other components within normal limits  I-STAT VENOUS BLOOD GAS, ED - Abnormal; Notable for the following components:   pH, Ven 7.504 (*)    pCO2, Ven 28.2 (*)    Calcium, Ion 1.07 (*)    All other components within normal limits  AEROBIC/ANAEROBIC CULTURE (SURGICAL/DEEP WOUND)  SEDIMENTATION RATE  LACTIC ACID, PLASMA  CBC  CREATININE, SERUM  CBC  BASIC METABOLIC PANEL  MAGNESIUM  PHOSPHORUS    EKG None  Radiology CT ABDOMEN PELVIS WO CONTRAST  Result Date: 02/15/2020 CLINICAL DATA:  Fecal material in feeding tube.  Sacral wounds. EXAM: CT ABDOMEN AND PELVIS WITHOUT CONTRAST TECHNIQUE: Multidetector CT imaging of the abdomen and pelvis was performed following the standard protocol without IV contrast. COMPARISON:  Dec 11, 2019. FINDINGS: Lower chest: No acute abnormality. Hepatobiliary: No focal liver  abnormality is seen. Status post cholecystectomy. No biliary dilatation. Pancreas: Unremarkable. No pancreatic ductal dilatation or surrounding inflammatory changes. Spleen: Normal in size without focal abnormality. Adrenals/Urinary Tract: Adrenal glands appear normal. Nonobstructive left renal calculus is noted. No hydronephrosis or renal obstruction is noted. Urinary bladder is decompressed. Stomach/Bowel: Gastrostomy tube is seen within the gastric lumen. The stomach is mild-to-moderately distended and fluid-filled. There is interval development of significant proximal small bowel dilatation is noted, with several loops of small bowel seen in large ventral hernia in the pelvis. The possibility of obstruction secondary to this herniated bowel cannot be excluded. There also appears to be a portion of transverse colon extending into large supraumbilical hernia, but it does not appear to be resulting in obstruction. Vascular/Lymphatic: Aortic atherosclerosis. No enlarged abdominal or pelvic lymph nodes. Reproductive: Status post hysterectomy. No adnexal masses. Other: 7.3 x 3.8 cm fluid collection is now seen in presacral space which contains air in its more superior portion, and is concerning for possible abscess or postoperative seroma. Large decubitus ulcer is seen overlying the distal portion of the sacrum and extending inferiorly, which is significantly enlarged compared to prior exam. Musculoskeletal: Extensive postsurgical changes are seen involving the lumbar spine, including laminectomies of L4, L5 and S1. Status post surgical anterior fusion of L5-S1. Patient appears to have undergone intrapedicular screw removal at the L3,  L4 and L5 levels. There is a large fluid collection with air-fluid level in the soft tissues posterior to the lower lumbar spine extending from L3-S1. IMPRESSION: 1. Interval development of significant proximal small bowel dilatation is noted, with several loops of small bowel seen in  large ventral hernia in the pelvis. The possibility of obstruction secondary to this herniated bowel cannot be excluded. 2. Significant enlargement of decubitus ulcer which is seen primarily just inferior to the sacrum posteriorly. 3. There also appears to be a portion of transverse colon extending into large supraumbilical hernia, but it does not appear to be resulting in obstruction. 4. 7.3 x 3.8 cm fluid collection is now seen in presacral space which contains air in its more superior portion, and is concerning for possible abscess or postoperative seroma. 5. Extensive postsurgical changes are seen involving the lumbar spine, including laminectomies of L4, L5 and S1. Patient appears to have undergone intrapedicular screw removal at the L3, L4 and L5 levels. There is a large fluid collection with air-fluid level in the soft tissues posterior to the lower lumbar spine extending from L3-S1. This is not significantly changed compared to prior exam. 6. Nonobstructive left renal calculus. No hydronephrosis or renal obstruction is noted. Aortic Atherosclerosis (ICD10-I70.0). Electronically Signed   By: Marijo Conception M.D.   On: 02/07/2020 10:42   CT Head Wo Contrast  Result Date: 02/11/2020 CLINICAL DATA:  Altered mental status. EXAM: CT HEAD WITHOUT CONTRAST TECHNIQUE: Contiguous axial images were obtained from the base of the skull through the vertex without intravenous contrast. COMPARISON:  12/11/2019 FINDINGS: Brain: Generalized atrophy. Chronic small-vessel ischemic changes of the cerebral hemispheric white matter. No sign of recent infarction, mass lesion, hemorrhage, hydrocephalus or extra-axial collection. Vascular: There is atherosclerotic calcification of the major vessels at the base of the brain. Skull: Negative Sinuses/Orbits: Clear/normal Other: None IMPRESSION: No acute intracranial finding by CT. Atrophy and chronic small-vessel ischemic changes as seen previously. Electronically Signed   By: Nelson Chimes M.D.   On: 02/27/2020 10:35   CT L-SPINE NO CHARGE  Result Date: 02/14/2020 CLINICAL DATA:  Altered mental status.  Back pain. EXAM: CT LUMBAR SPINE WITHOUT CONTRAST TECHNIQUE: Multidetector CT imaging of the lumbar spine was performed without intravenous contrast administration. Multiplanar CT image reconstructions were also generated. COMPARISON:  12/19/2019.  06/17/2019. FINDINGS: Segmentation: 5 lumbar type vertebral bodies as numbered previously. Alignment: Anterolisthesis at L4-5 of 8 mm now present. Vertebrae: Anterior fusion at L5-S1. Removal of posterior hardware previously present from L3 to the sacrum. Screw shadows remain visible. Probable solid union at L3-4. Probable nonunion at L4-5 with partial anterior extrusion of the interbody spacer. Some endplate irregularity. Possibility of infection is not excluded at this level. Solid union at L5-S1. Paraspinal and other soft tissues: Fluid collection posteriorly from L3 to the sacrum, containing air or gas, worrisome for infection absent recent intervention. Disc levels: No significant finding seen at T12-L1 or L1-2. L2-3: Disc bulge. Facet degeneration and hypertrophy. Stenosis of the lateral recesses. L3 to sacrum: Previous posterior decompression and surgical changes as above. IMPRESSION: Status post posterior fusion hardware removal from L3 to the sacrum. Previous anterior discectomy and fusion procedure L5-S1 appears solid. L3-4 fusion appears solid. Probable nonunion at L4-5 with development of 8 mm of anterolisthesis and partial anterior extrusion of the interbody spacer. Some endplate irregularity. Infection at this level not excluded. Posterior fluid collection in the region of the surgery with air/gas, suggesting possibility of infection, absent performance of  recent surgery. Electronically Signed   By: Nelson Chimes M.D.   On: 03/01/2020 10:34   DG Chest Port 1 View  Result Date: 02/05/2020 CLINICAL DATA:  Altered mental status,  end-stage renal disease, diabetes mellitus, hypertension EXAM: PORTABLE CHEST 1 VIEW COMPARISON:  Portable exam 0939 hours compared to 12/12/2019 FINDINGS: RIGHT jugular dual-lumen catheter with tip projecting over RIGHT atrium. Enlargement of cardiac silhouette with pulmonary vascular congestion. Atherosclerotic calcification aorta. Decreased lung volumes with bibasilar atelectasis. Slight crowding of markings in the perihilar regions versus minimal pulmonary edema. No pleural effusion or pneumothorax. IMPRESSION: Decreased lung volumes with bibasilar atelectasis. Enlargement of cardiac silhouette. Cannot exclude minimal perihilar edema. Electronically Signed   By: Lavonia Dana M.D.   On: 02/12/2020 09:48    Procedures .Critical Care Performed by: Varney Biles, MD Authorized by: Varney Biles, MD   Critical care provider statement:    Critical care time (minutes):  62   Critical care was necessary to treat or prevent imminent or life-threatening deterioration of the following conditions:  Sepsis and circulatory failure   Critical care was time spent personally by me on the following activities:  Discussions with consultants, evaluation of patient's response to treatment, examination of patient, ordering and performing treatments and interventions, ordering and review of laboratory studies, ordering and review of radiographic studies, pulse oximetry, re-evaluation of patient's condition, obtaining history from patient or surrogate and review of old charts .Central Line  Date/Time: 02/15/2020 8:59 PM Performed by: Kennyth Lose, MD Authorized by: Varney Biles, MD   Consent:    Consent obtained:  Verbal   Consent given by:  Guardian   Risks discussed:  Arterial puncture, incorrect placement, nerve damage, infection and bleeding Universal protocol:    Procedure explained and questions answered to patient or proxy's satisfaction: yes     Required blood products, implants, devices, and  special equipment available: yes     Site/side marked: yes     Immediately prior to procedure, a time out was called: yes     Patient identity confirmed:  Arm band Pre-procedure details:    Hand hygiene: Hand hygiene performed prior to insertion     Skin preparation:  2% chlorhexidine   Skin preparation agent: Skin preparation agent completely dried prior to procedure   Procedure details:    Location:  R femoral   Site selection rationale:  Uncooperative patient, has tunneled catheter over the right thorax   Patient position:  Flat   Procedural supplies:  Triple lumen   Landmarks identified: yes     Ultrasound guidance: yes     Sterile ultrasound techniques: Sterile gel and sterile probe covers were used     Number of attempts:  1   Successful placement: yes   Post-procedure details:    Post-procedure:  Dressing applied and line sutured   Assessment:  Blood return through all ports   Patient tolerance of procedure:  Tolerated well, no immediate complications   (including critical care time)  Medications Ordered in ED Medications  0.9 %  sodium chloride infusion (0 mLs Intravenous Hold 02/24/2020 1926)  diatrizoate meglumine-sodium (GASTROGRAFIN) 66-10 % solution 90 mL (has no administration in time range)  levothyroxine (SYNTHROID) tablet 100 mcg (has no administration in time range)  docusate sodium (COLACE) capsule 100 mg (has no administration in time range)  polyethylene glycol (MIRALAX / GLYCOLAX) packet 17 g (has no administration in time range)  heparin injection 5,000 Units (has no administration in time range)    ED  Course  I have reviewed the triage vital signs and the nursing notes.  Pertinent labs & imaging results that were available during my care of the patient were reviewed by me and considered in my medical decision making (see chart for details).  Clinical Course as of Mar 02 2201  Fri Mar 02, 2020  2102 With patient's SBP less than 90, and source not in  control, we consulted critical care medicine.  They will see the patient.  Pressors ordered.  Central line placed.   [AN]  2102 Dr. Bobbye Morton, general surgery will see the patient for the small obstruction.   [AN]  2103 I had discussed case with Dr. Owens Shark, interventional radiology.  He has reviewed patient's imaging and will consult with the admitting team to see if there is an opportunity for them to drain the fluid.    [AN]    Clinical Course User Index [AN] Varney Biles, MD   MDM Rules/Calculators/A&P                          79 year old female comes in a chief complaint of complicated wound with septic shock.  She had received 3.5 L of fluid prior to my assessment, final mL of which was per [facility].  She had also received antibiotics at [facility].  I have reviewed patient's imaging and discussed the case with her son.  It appears that patient could have small bowel obstruction based on the history and the CT findings.  General surgery will be paged.  The biggest issue is actually the complex wound with abscess.  It appears that there also could be discitis, osteomyelitis of the spine.  I consulted with neurosurgery and spoke with Dr. Kathyrn Sheriff.  He informed me that he has taken care of the patient in the past and knows of her well.  Patient is not a good candidate for surgery.  He recommends IV antibiotics, just like she received last time.  The son informs me that last time patient received IV antibiotics she improved dramatically and was discharged to the facility.  Patient's CODE STATUS is full code.  Lactic acid has come down slightly. Blood pressure has gotten worse at 7 PM reassessment. I will start norepinephrine at this time.  I discussed with the son palliative consult.  It appears that there has been poor experience earlier with palliative team.  Given that patient is having recurrent wounds with no definitive therapy available and poor wound healing and multiple  chronic comorbidities, I do not anticipate her to recover fully from this wound, in the long run at least.     Final Clinical Impression(s) / ED Diagnoses Final diagnoses:  Septic shock ([place])  SBO (small bowel obstruction) [facility])    Rx / DC Orders ED Discharge Orders    None       Varney Biles, MD 02/13/2020 1918    Varney Biles, MD 02/08/2020 2202

## 2020-03-02 NOTE — ED Notes (Signed)
Pt has two pressure wounds on lower back and bottom area. Previously documented and cared for in nursing home. Upon arrival, pt wounds had stool in them. Cleaned and irrigated with sterile water and packed with wet to dry dressing. Tegaderm placed over packing. Pt cleaned of stool and new sheets applied.

## 2020-03-02 NOTE — ED Notes (Addendum)
Central line cart at bedside

## 2020-03-02 NOTE — Progress Notes (Signed)
CODE SEPSIS - PHARMACY COMMUNICATION  **Broad Spectrum Antibiotics should be administered within 1 hour of Sepsis diagnosis**  Time Code Sepsis Called/Page Received: @ 1036   Antibiotics Ordered: Meropenem and Vancomycin   Time of 1st antibiotic administration: @ 1153   Additional action taken by pharmacy:  Messaged MD @ 1100 about antibiotics for Code Sepsis Called ED @ 1111 to speak to RN or MD about antibiotics and time left for Code Sepsis- Both MD and RN were caring for another patient who required CPR.  If necessary, Name of Provider/Nurse Contacted: Dr. Gearldine Shown, PharmD, BCPS Clinical Pharmacist 02/05/2020 11:53 AM

## 2020-03-02 NOTE — ED Notes (Signed)
Dr Celine Ahr and Alroy Dust, [place] at bedside. Assisted to roll pt and redress wounds. G-tube drained.

## 2020-03-02 NOTE — Consult Note (Signed)
Reason for Consult/Chief Complaint: SBO Consultant: Kathrynn Humble, MD  LUDMILA EBARB is an 79 y.o. female.   HPI: 85F with multiple medical problems presents from SNF with altered mentation. Initial presentation was to [facility], also with report of feculent material from G-tube. Patient was transferred to [facility] and presented in septic shock. Resuscitated and underwent evaluation by NSGY for known lumbosacral infection after lumbar fusion and multiple debridements. No surgical intervention recommended, however patient underwent CT scan of the abdomen concerning for SBO, for which general surgery is being consulted. The patient is completely non-verbal and history is obtained from chart review.   Past Medical History:  Diagnosis Date  . Acute blood loss anemia 03/14/2017  . Acute renal failure superimposed on stage 3 chronic kidney disease ([place]) 03/14/2017  . AKI (acute kidney injury) ([place]) 02/28/2017  . Altered mental status 03/29/2017  . Anemia   . Aortic atherosclerosis ([place])   . Arthritis    "joints might ache at times; not that bad" (06/10/2018)  . Bilateral lower extremity edema 03/04/2017  . Bradycardia   . Chronic kidney disease    ?? renal insufficiency,   . CKD (chronic kidney disease) stage 3, GFR 30-59 ml/min 01/15/2017   ?? renal insufficiency, which she thinks is coming from "all these medications"  . COVID-19    10/17/19  . Dementia ([place])   . Diabetes mellitus without complication ([place])    diagnosed 4-5 yrs ago, 06/21/18- "that was years ago"  . Disease of pancreas   . Diverticulitis    s/p perforation and partial colectomy 01/27/14 with 3 benign lymph nodes   . Diverticulosis 01/15/2017  . DJD (degenerative joint disease)   . DM (diabetes mellitus), type 2 with renal complications ([place]) 04/07/91  . Elevated ferritin level   . Fatty liver   . Hypertension   . Hypothyroidism    "had radiation" (06/10/2018)  . Kidney disease, chronic, stage V (GFR under 15 ml/min) ([place])    per  notes from nursing home-she is on dialysis  . Kidney stone   . Lethargy 02/28/2017  . Obesity, Class III, BMI 40-49.9 (morbid obesity) ([place]) 02/27/2017  . Pleural lipoma   . Postoperative wound infection 04/02/2017  . Spinal stenosis of lumbar region 01/15/2017  . Status post lumbar surgery   . Wound healing, delayed    back    Past Surgical History:  Procedure Laterality Date  . ABDOMINAL EXPOSURE N/A 02/24/2017   Procedure: ABDOMINAL EXPOSURE;  Surgeon: Angelia Mould, MD;  Location: [place];  Service: Vascular;  Laterality: N/A;  . ABDOMINAL HYSTERECTOMY  1987   no h/o abnormal paps   . ANTERIOR LAT LUMBAR FUSION N/A 02/24/2017   Procedure: Lumbar three- five Anterior lateral lumbar interbody fusion;  Surgeon: Ditty, Kevan Ny, MD;  Location: [place];  Service: Neurosurgery;  Laterality: N/A;  L3-5 Anterior lateral lumbar interbody fusion with removal of coflex at L3-4, L4-5  . ANTERIOR LUMBAR FUSION N/A 02/24/2017   Procedure: Stage 1: Lumbar five-Sacral one Anterior lumbar interbody fusion;  Surgeon: Ditty, Kevan Ny, MD;  Location: [place];  Service: Neurosurgery;  Laterality: N/A;  Stage 1: L5-S1 Anterior lumbar interbody fusion  . APPENDECTOMY    . APPLICATION OF ROBOTIC ASSISTANCE FOR SPINAL PROCEDURE N/A 02/26/2017   Procedure: APPLICATION OF ROBOTIC ASSISTANCE FOR SPINAL PROCEDURE;  Surgeon: Ditty, Kevan Ny, MD;  Location: [place];  Service: Neurosurgery;  Laterality: N/A;  . APPLICATION OF WOUND VAC N/A 03/30/2017   Procedure: APPLICATION  OF WOUND VAC;  Surgeon: Ditty, Kevan Ny, MD;  Location: [place];  Service: Neurosurgery;  Laterality: N/A;  . APPLICATION OF WOUND VAC N/A 06/23/2019   Procedure: APPLICATION OF WOUND VAC;  Surgeon: Consuella Lose, MD;  Location: [place];  Service: Neurosurgery;  Laterality: N/A;  . AV FISTULA PLACEMENT Left 06/08/2019   Procedure: Arteriovenous (Av) Fistula Creation;  Surgeon: Rosetta Posner, MD;  Location: [place];  Service:  Vascular;  Laterality: Left;  . BACK SURGERY  2013  . CATARACT EXTRACTION W/ INTRAOCULAR LENS IMPLANT Right   . CHOLECYSTECTOMY OPEN  1982  . COLON SURGERY  01/26/2014   desc.sigmoid colectomy and ventral hernia repair and splenic flexure mobilization  . COLON SURGERY    . DILATION AND CURETTAGE OF UTERUS    . ESOPHAGOGASTRODUODENOSCOPY  12/24/2019   normal with PEG placement Dr. Alice Reichert  . HERNIA REPAIR    . INSERTION OF DIALYSIS CATHETER Right 06/08/2019   Procedure: INSERTION OF DIALYSIS CATHETER RIGHT INTERNAL JUGULAR;  Surgeon: Rosetta Posner, MD;  Location: [place];  Service: Vascular;  Laterality: Right;  . LUMBAR LAMINECTOMY WITH SPINOUS PROCESS PLATE 2 LEVEL N/A 3/32/9518   Procedure: LUMBAR LAMINECTOMY/DECOMPRESSION MICRODISCECTOMY CoFlex;  Surgeon: Faythe Ghee, MD;  Location: [facility];  Service: Neurosurgery;  Laterality: N/A;  Lumbar three-four,Lumbar Four-Five Laminectomy with Coflex  . LUMBAR WOUND DEBRIDEMENT N/A 03/30/2017   Procedure: Lumbar wound exploration/debridement, placement of wound vac;  Surgeon: Ditty, Kevan Ny, MD;  Location: [place];  Service: Neurosurgery;  Laterality: N/A;  Lumbar wound exploration/debridement, placement of wound vac  . LUMBAR WOUND DEBRIDEMENT N/A 06/06/2018   Procedure: LUMBAR WOUND DEBRIDEMENT/EXPLORATION;  Surgeon: Consuella Lose, MD;  Location: [place];  Service: Neurosurgery;  Laterality: N/A;  . LUMBAR WOUND DEBRIDEMENT N/A 06/22/2018   Procedure: SIMPLE INCISION AND DRAINAGE OF WOUND, APPLICATION OF WOUND VAC;  Surgeon: Consuella Lose, MD;  Location: [place];  Service: Neurosurgery;  Laterality: N/A;  . LUMBAR WOUND DEBRIDEMENT N/A 06/23/2019   Procedure: LUMBAR WOUND DEBRIDEMENT WITH HARDWARE REMOVAL;  Surgeon: Consuella Lose, MD;  Location: [place];  Service: Neurosurgery;  Laterality: N/A;  . PARTIAL COLECTOMY     01/27/14 diverticulitis and 3 benign lymph nodes [facility] Dr. Pat Patrick   . PEG PLACEMENT N/A 12/24/2019   Procedure:  PERCUTANEOUS ENDOSCOPIC GASTROSTOMY (PEG) PLACEMENT;  Surgeon: Toledo, [name], MD;  Location: [facility];  Service: Gastroenterology;  Laterality: N/A;  . REDUCTION MAMMAPLASTY Bilateral 1992  . THROMBECTOMY BRACHIAL ARTERY Left 06/08/2019   Procedure: Thrombectomy Brachial Artery;  Surgeon: Rosetta Posner, MD;  Location: [place];  Service: Vascular;  Laterality: Left;  . TUBAL LIGATION    . VENTRAL HERNIA REPAIR  2014    Family History  Problem Relation Age of Onset  . Diabetes Father   . Heart attack Father   . Hypertension Father   . Heart disease Father   . Kidney disease Father   . Diabetes Mother   . Hypertension Mother   . Heart disease Mother   . Kidney disease Mother   . Lupus Sister   . Heart disease Brother   . Diabetes Brother   . Heart disease Brother   . Diabetes Brother   . Lupus Sister     Social History:  reports that she has never smoked. She has never used smokeless tobacco. She reports previous alcohol use. She reports that she does not use drugs.  Allergies:  Allergies  Allergen Reactions  . Clams [employer] Allergy]  Swelling and Other (See Comments)    THROAT SWELLS NECK TURNS RED  . Hydralazine Other (See Comments)    CHEST TIGHTNESS Patient has tolerated multiple doses of hydralazine since allergy was listed   . Norvasc [Amlodipine Besylate] Swelling    Leg edema   . Milk-Related Compounds Diarrhea    Medications: I have reviewed the patient's current medications.  Results for orders placed or performed during the hospital encounter of 02/03/2020 (from the past 48 hour(s))  Sedimentation rate     Status: None   Collection Time: 02/07/2020  5:07 PM  Result Value Ref Range   Sed Rate 8 0 - 22 mm/hr    Comment: Performed at [facility], [place] [address], [place], [place] [address]  C-reactive protein     Status: Abnormal   Collection Time: 02/07/2020  5:07 PM  Result Value Ref Range   CRP 20.4 (H) <1.0 mg/dL    Comment: Performed at [facility], [place] [address], [place], [place] [address]  Lactic acid, plasma     Status: Abnormal   Collection Time: 02/12/2020  5:22 PM  Result Value Ref Range   Lactic Acid, Venous 5.0 (HH) 0.5 - 1.9 mmol/L    Comment: CRITICAL RESULT CALLED TO, READ BACK BY AND VERIFIED WITH: R.HARDY RN 1750 02/11/2020 MCCORMICK K Performed at [facility], [place] [address], [place], [place] [address]   I-Stat venous blood gas, [facility])     Status: Abnormal   Collection Time: 02/09/2020  5:32 PM  Result Value Ref Range   pH, Ven 7.504 (H) 7.25 - 7.43   pCO2, Ven 28.2 (L) 44 - 60 mmHg   pO2, Ven 42.0 32 - 45 mmHg   Bicarbonate 22.2 20.0 - 28.0 mmol/L   TCO2 23 22 - 32 mmol/L   O2 Saturation 83.0 %   Acid-Base Excess 0.0 0.0 - 2.0 mmol/L   Sodium 135 135 - 145 mmol/L   Potassium 3.5 3.5 - 5.1 mmol/L   Calcium, Ion 1.07 (L) 1.15 - 1.40 mmol/L   HCT 36.0 36 - 46 %   Hemoglobin 12.2 12.0 - 15.0 g/dL   Sample type VENOUS   Valproic acid level     Status: Abnormal   Collection Time: 02/07/2020  6:30 PM  Result Value Ref Range   Valproic Acid Lvl <10 (L) 50.0 - 100.0 ug/mL    Comment: RESULTS CONFIRMED BY MANUAL DILUTION Performed at [facility], [address] [place],  [address]     CT ABDOMEN PELVIS WO CONTRAST  Result Date: 02/07/2020 CLINICAL DATA:  Fecal material in feeding tube.  Sacral wounds. EXAM: CT ABDOMEN AND PELVIS WITHOUT CONTRAST TECHNIQUE: Multidetector CT imaging of the abdomen and pelvis was performed following the standard protocol without IV contrast. COMPARISON:  Dec 11, 2019. FINDINGS: Lower chest: No acute abnormality. Hepatobiliary: No focal liver abnormality is seen. Status post cholecystectomy. No biliary dilatation. Pancreas: Unremarkable. No pancreatic ductal dilatation or surrounding inflammatory changes. Spleen: Normal in size without focal abnormality. Adrenals/Urinary Tract: Adrenal glands appear normal. Nonobstructive left renal calculus is noted. No  hydronephrosis or renal obstruction is noted. Urinary bladder is decompressed. Stomach/Bowel: Gastrostomy tube is seen within the gastric lumen. The stomach is mild-to-moderately distended and fluid-filled. There is interval development of significant proximal small bowel dilatation is noted, with several loops of small bowel seen in large ventral hernia in the pelvis. The possibility of obstruction secondary to this herniated bowel cannot  be excluded. There also appears to be a portion of transverse colon extending into large supraumbilical hernia, but it does not appear to be resulting in obstruction. Vascular/Lymphatic: Aortic atherosclerosis. No enlarged abdominal or pelvic lymph nodes. Reproductive: Status post hysterectomy. No adnexal masses. Other: 7.3 x 3.8 cm fluid collection is now seen in presacral space which contains air in its more superior portion, and is concerning for possible abscess or postoperative seroma. Large decubitus ulcer is seen overlying the distal portion of the sacrum and extending inferiorly, which is significantly enlarged compared to prior exam. Musculoskeletal: Extensive postsurgical changes are seen involving the lumbar spine, including laminectomies of L4, L5 and S1. Status post surgical anterior fusion of L5-S1. Patient appears to have undergone intrapedicular screw removal at the L3, L4 and L5 levels. There is a large fluid collection with air-fluid level in the soft tissues posterior to the lower lumbar spine extending from L3-S1. IMPRESSION: 1. Interval development of significant proximal small bowel dilatation is noted, with several loops of small bowel seen in large ventral hernia in the pelvis. The possibility of obstruction secondary to this herniated bowel cannot be excluded. 2. Significant enlargement of decubitus ulcer which is seen primarily just inferior to the sacrum posteriorly. 3. There also appears to be a portion of transverse colon extending into large  supraumbilical hernia, but it does not appear to be resulting in obstruction. 4. 7.3 x 3.8 cm fluid collection is now seen in presacral space which contains air in its more superior portion, and is concerning for possible abscess or postoperative seroma. 5. Extensive postsurgical changes are seen involving the lumbar spine, including laminectomies of L4, L5 and S1. Patient appears to have undergone intrapedicular screw removal at the L3, L4 and L5 levels. There is a large fluid collection with air-fluid level in the soft tissues posterior to the lower lumbar spine extending from L3-S1. This is not significantly changed compared to prior exam. 6. Nonobstructive left renal calculus. No hydronephrosis or renal obstruction is noted. Aortic Atherosclerosis (ICD10-I70.0). Electronically Signed   By: Marijo Conception M.D.   On: 02/19/2020 10:42   CT Head Wo Contrast  Result Date: 02/11/2020 CLINICAL DATA:  Altered mental status. EXAM: CT HEAD WITHOUT CONTRAST TECHNIQUE: Contiguous axial images were obtained from the base of the skull through the vertex without intravenous contrast. COMPARISON:  12/11/2019 FINDINGS: Brain: Generalized atrophy. Chronic small-vessel ischemic changes of the cerebral hemispheric white matter. No sign of recent infarction, mass lesion, hemorrhage, hydrocephalus or extra-axial collection. Vascular: There is atherosclerotic calcification of the major vessels at the base of the brain. Skull: Negative Sinuses/Orbits: Clear/normal Other: None IMPRESSION: No acute intracranial finding by CT. Atrophy and chronic small-vessel ischemic changes as seen previously. Electronically Signed   By: Nelson Chimes M.D.   On: 02/26/2020 10:35   CT L-SPINE NO CHARGE  Result Date: 02/28/2020 CLINICAL DATA:  Altered mental status.  Back pain. EXAM: CT LUMBAR SPINE WITHOUT CONTRAST TECHNIQUE: Multidetector CT imaging of the lumbar spine was performed without intravenous contrast administration. Multiplanar CT  image reconstructions were also generated. COMPARISON:  12/19/2019.  06/17/2019. FINDINGS: Segmentation: 5 lumbar type vertebral bodies as numbered previously. Alignment: Anterolisthesis at L4-5 of 8 mm now present. Vertebrae: Anterior fusion at L5-S1. Removal of posterior hardware previously present from L3 to the sacrum. Screw shadows remain visible. Probable solid union at L3-4. Probable nonunion at L4-5 with partial anterior extrusion of the interbody spacer. Some endplate irregularity. Possibility of infection is not excluded at this  level. Solid union at L5-S1. Paraspinal and other soft tissues: Fluid collection posteriorly from L3 to the sacrum, containing air or gas, worrisome for infection absent recent intervention. Disc levels: No significant finding seen at T12-L1 or L1-2. L2-3: Disc bulge. Facet degeneration and hypertrophy. Stenosis of the lateral recesses. L3 to sacrum: Previous posterior decompression and surgical changes as above. IMPRESSION: Status post posterior fusion hardware removal from L3 to the sacrum. Previous anterior discectomy and fusion procedure L5-S1 appears solid. L3-4 fusion appears solid. Probable nonunion at L4-5 with development of 8 mm of anterolisthesis and partial anterior extrusion of the interbody spacer. Some endplate irregularity. Infection at this level not excluded. Posterior fluid collection in the region of the surgery with air/gas, suggesting possibility of infection, absent performance of recent surgery. Electronically Signed   By: Nelson Chimes M.D.   On: 02/27/2020 10:34   DG Chest Port 1 View  Result Date: 02/26/2020 CLINICAL DATA:  Altered mental status, end-stage renal disease, diabetes mellitus, hypertension EXAM: PORTABLE CHEST 1 VIEW COMPARISON:  Portable exam 0939 hours compared to 12/12/2019 FINDINGS: RIGHT jugular dual-lumen catheter with tip projecting over RIGHT atrium. Enlargement of cardiac silhouette with pulmonary vascular congestion.  Atherosclerotic calcification aorta. Decreased lung volumes with bibasilar atelectasis. Slight crowding of markings in the perihilar regions versus minimal pulmonary edema. No pleural effusion or pneumothorax. IMPRESSION: Decreased lung volumes with bibasilar atelectasis. Enlargement of cardiac silhouette. Cannot exclude minimal perihilar edema. Electronically Signed   By: Lavonia Dana M.D.   On: 02/10/2020 09:48    ROS 10 point review of systems is negative except as listed above in HPI.   Physical Exam Blood pressure (!) 84/55, pulse 88, temperature 98.6 F (37 C), temperature source Oral, resp. rate 21, height 5\' 6"  (1.676 m), weight 88.5 kg, SpO2 100 %. Constitutional: well-developed, well-nourished HEENT: pupils equal, round, reactive to light, 17mm b/l, moist conjunctiva, external inspection of ears and nose normal Oropharynx: normal oropharyngeal mucosa, poor dentition Neck: no thyromegaly, trachea midline, unable to assess midline cervical tenderness to palpation Chest: breath sounds equal bilaterally, normal respiratory effort, no midline or lateral chest wall tenderness to palpation/deformity Abdomen: soft, multiple large ventral hernias, all very soft and do not seem tender, easily reduce, G-tube present without visible feculent material, no bruising, no hepatosplenomegaly GU: normal female genitalia  Back: no wounds, unable to assess thoracic/lumbar spine tenderness to palpation, no thoracic/lumbar spine stepoffs Rectal: deferred Extremities: 2+ radial and pedal pulses bilaterally, motor and sensation to bilateral UE and LE unable to be assessed, no peripheral edema MSK: unable to assess gait/station, no clubbing/cyanosis of fingers/toes Skin: warm, dry, no rashes Psych: nonverbal    Assessment/Plan: 55F with multiple co-morbidities presenting in what is likely septic shock and with radiographic suspicion for SBO. Recommend SB protocol.   Jesusita Oka, MD General and Arab Surgery

## 2020-03-02 NOTE — Progress Notes (Signed)
[facility] Progress Note Patient Name: Taylor Tyler DOB: [birth year] MRN: [id number]   Date of Service  02/27/2020  HPI/Events of Note  Hypotension - BP = 79/39 with MAP = 51.   eICU Interventions  Plan: 1. Norepinephrine IV infusion. Titrate to MAP >= 65.      Intervention Category Major Interventions: Hypotension - evaluation and management  Hale Chalfin Eugene 02/13/2020, 11:17 PM

## 2020-03-03 ENCOUNTER — Inpatient Hospital Stay (HOSPITAL_COMMUNITY): Payer: Medicare Other

## 2020-03-03 DIAGNOSIS — E1122 Type 2 diabetes mellitus with diabetic chronic kidney disease: Secondary | ICD-10-CM | POA: Diagnosis not present

## 2020-03-03 DIAGNOSIS — B952 Enterococcus as the cause of diseases classified elsewhere: Secondary | ICD-10-CM

## 2020-03-03 DIAGNOSIS — Z1621 Resistance to vancomycin: Secondary | ICD-10-CM

## 2020-03-03 DIAGNOSIS — Z992 Dependence on renal dialysis: Secondary | ICD-10-CM | POA: Diagnosis not present

## 2020-03-03 DIAGNOSIS — R7881 Bacteremia: Secondary | ICD-10-CM

## 2020-03-03 DIAGNOSIS — N186 End stage renal disease: Secondary | ICD-10-CM | POA: Diagnosis not present

## 2020-03-03 LAB — GLUCOSE, CAPILLARY
Glucose-Capillary: 100 mg/dL — ABNORMAL HIGH (ref 70–99)
Glucose-Capillary: 152 mg/dL — ABNORMAL HIGH (ref 70–99)
Glucose-Capillary: 157 mg/dL — ABNORMAL HIGH (ref 70–99)
Glucose-Capillary: 187 mg/dL — ABNORMAL HIGH (ref 70–99)
Glucose-Capillary: 51 mg/dL — ABNORMAL LOW (ref 70–99)
Glucose-Capillary: 62 mg/dL — ABNORMAL LOW (ref 70–99)
Glucose-Capillary: 75 mg/dL (ref 70–99)
Glucose-Capillary: 92 mg/dL (ref 70–99)

## 2020-03-03 LAB — MAGNESIUM: Magnesium: 2.4 mg/dL (ref 1.7–2.4)

## 2020-03-03 LAB — CBC
HCT: 32.1 % — ABNORMAL LOW (ref 36.0–46.0)
Hemoglobin: 9.7 g/dL — ABNORMAL LOW (ref 12.0–15.0)
MCH: 30.9 pg (ref 26.0–34.0)
MCHC: 30.2 g/dL (ref 30.0–36.0)
MCV: 102.2 fL — ABNORMAL HIGH (ref 80.0–100.0)
Platelets: 195 10*3/uL (ref 150–400)
RBC: 3.14 MIL/uL — ABNORMAL LOW (ref 3.87–5.11)
RDW: 18.2 % — ABNORMAL HIGH (ref 11.5–15.5)
WBC: 45.7 10*3/uL — ABNORMAL HIGH (ref 4.0–10.5)
nRBC: 0 % (ref 0.0–0.2)

## 2020-03-03 LAB — LACTIC ACID, PLASMA: Lactic Acid, Venous: 3.5 mmol/L (ref 0.5–1.9)

## 2020-03-03 LAB — BLOOD CULTURE ID PANEL (REFLEXED)
Acinetobacter baumannii: NOT DETECTED
Candida albicans: NOT DETECTED
Candida glabrata: NOT DETECTED
Candida krusei: NOT DETECTED
Candida parapsilosis: NOT DETECTED
Candida tropicalis: NOT DETECTED
Enterobacter cloacae complex: NOT DETECTED
Enterobacteriaceae species: NOT DETECTED
Enterococcus species: DETECTED — AB
Escherichia coli: NOT DETECTED
Haemophilus influenzae: NOT DETECTED
Klebsiella oxytoca: NOT DETECTED
Klebsiella pneumoniae: NOT DETECTED
Listeria monocytogenes: NOT DETECTED
Neisseria meningitidis: NOT DETECTED
Proteus species: NOT DETECTED
Pseudomonas aeruginosa: NOT DETECTED
Serratia marcescens: NOT DETECTED
Staphylococcus aureus (BCID): NOT DETECTED
Staphylococcus species: NOT DETECTED
Streptococcus agalactiae: NOT DETECTED
Streptococcus pneumoniae: NOT DETECTED
Streptococcus pyogenes: NOT DETECTED
Streptococcus species: NOT DETECTED
Vancomycin resistance: DETECTED — AB

## 2020-03-03 LAB — HEMOGLOBIN A1C
Hgb A1c MFr Bld: 4.1 % — ABNORMAL LOW (ref 4.8–5.6)
Mean Plasma Glucose: 70.97 mg/dL

## 2020-03-03 LAB — BASIC METABOLIC PANEL
Anion gap: 14 (ref 5–15)
BUN: 43 mg/dL — ABNORMAL HIGH (ref 8–23)
CO2: 23 mmol/L (ref 22–32)
Calcium: 8.6 mg/dL — ABNORMAL LOW (ref 8.9–10.3)
Chloride: 99 mmol/L (ref 98–111)
Creatinine, Ser: 2.35 mg/dL — ABNORMAL HIGH (ref 0.44–1.00)
GFR calc Af Amer: 23 mL/min — ABNORMAL LOW (ref >60)
GFR calc non Af Amer: 19 mL/min — ABNORMAL LOW (ref >60)
Glucose, Bld: 147 mg/dL — ABNORMAL HIGH (ref 70–99)
Potassium: 3.4 mmol/L — ABNORMAL LOW (ref 3.5–5.1)
Sodium: 136 mmol/L (ref 135–145)

## 2020-03-03 LAB — MRSA PCR SCREENING: MRSA by PCR: NEGATIVE

## 2020-03-03 LAB — PHOSPHORUS: Phosphorus: 4.4 mg/dL (ref 2.5–4.6)

## 2020-03-03 MED ORDER — DARBEPOETIN ALFA 150 MCG/0.3ML IJ SOSY: 150.0000 ug | PREFILLED_SYRINGE | INTRAMUSCULAR | Status: DC

## 2020-03-03 MED ORDER — DEXTROSE 50 % IV SOLN: 25.0000 g | INTRAVENOUS | Status: AC

## 2020-03-03 MED ORDER — DEXTROSE 50 % IV SOLN
12.5000 g | INTRAVENOUS | Status: AC
  Administered 2020-03-03: 12.5 g via INTRAVENOUS
  Filled 2020-03-03: qty 50

## 2020-03-03 MED ORDER — LINEZOLID 600 MG/300ML IV SOLN
600.0000 mg | Freq: Two times a day (BID) | INTRAVENOUS | Status: DC
  Administered 2020-03-03 (×2): 600 mg via INTRAVENOUS
  Filled 2020-03-03 (×3): qty 300

## 2020-03-03 MED ORDER — DEXTROSE 10 % IV SOLN: INTRAVENOUS | Status: DC

## 2020-03-03 MED ORDER — DEXTROSE 50 % IV SOLN
INTRAVENOUS | Status: AC
  Administered 2020-03-03: 25 g via INTRAVENOUS
  Filled 2020-03-03: qty 50

## 2020-03-03 MED ORDER — CHLORHEXIDINE GLUCONATE CLOTH 2 % EX PADS
6.0000 | MEDICATED_PAD | Freq: Every day | CUTANEOUS | Status: DC
  Administered 2020-03-03: 6 via TOPICAL

## 2020-03-03 NOTE — Progress Notes (Addendum)
Hypoglycemic Event  CBG: 51  Treatment: D50 50 mL (25 gm)  Symptoms: None  Follow-up CBG: Time:2042 CBG Result:187  Possible Reasons for Event: Inadequate meal intake  Comments/MD notified:Dr. Oletta Darter, MD (see new order for D10 at 64)    Taylor Tyler

## 2020-03-03 NOTE — Progress Notes (Addendum)
[facility] Progress Note Patient Name: Taylor Tyler DOB: [birth year] MRN: [id number]   Date of Service  03/03/2020  HPI/Events of Note  Multiple episodes of hypoglycemia. Blood glucose = 51. Already given D50.  eICU Interventions  Plan: 1. D10W IV infusion at 30 mL/hour.      Intervention Category Major Interventions: Other:  Lysle Dingwall 03/03/2020, 8:32 PM

## 2020-03-03 NOTE — Progress Notes (Signed)
PHARMACY - PHYSICIAN COMMUNICATION CRITICAL VALUE ALERT - BLOOD CULTURE IDENTIFICATION (BCID)  Taylor Tyler is an 79 y.o. female who presented to [facility] on 03/01/2020 with a chief complaint of concerns for stool in the g-tube  Assessment:  Patient with 3 out of 4 positive blood cultures for vanc resistant enterococcus - likely source chronic non-healing spinal wound and likely pre-sacral abscess  Name of physician (or Provider) Contacted: Noe Gens  Current antibiotics: None  Changes to prescribed antibiotics recommended:  Add adequate VRE coverage - likely daptomycin; Noe Gens to communicate results to Dr. Lamonte Sakai who will decide on coverage  Results for orders placed or performed during the hospital encounter of 02/25/2020  Blood Culture ID Panel (Reflexed) (Collected: 02/03/2020  9:29 AM)  Result Value Ref Range   Enterococcus species DETECTED (A) NOT DETECTED   Vancomycin resistance DETECTED (A) NOT DETECTED   Listeria monocytogenes NOT DETECTED NOT DETECTED   Staphylococcus species NOT DETECTED NOT DETECTED   Staphylococcus aureus (BCID) NOT DETECTED NOT DETECTED   Streptococcus species NOT DETECTED NOT DETECTED   Streptococcus agalactiae NOT DETECTED NOT DETECTED   Streptococcus pneumoniae NOT DETECTED NOT DETECTED   Streptococcus pyogenes NOT DETECTED NOT DETECTED   Acinetobacter baumannii NOT DETECTED NOT DETECTED   Enterobacteriaceae species NOT DETECTED NOT DETECTED   Enterobacter cloacae complex NOT DETECTED NOT DETECTED   Escherichia coli NOT DETECTED NOT DETECTED   Klebsiella oxytoca NOT DETECTED NOT DETECTED   Klebsiella pneumoniae NOT DETECTED NOT DETECTED   Proteus species NOT DETECTED NOT DETECTED   Serratia marcescens NOT DETECTED NOT DETECTED   Haemophilus influenzae NOT DETECTED NOT DETECTED   Neisseria meningitidis NOT DETECTED NOT DETECTED   Pseudomonas aeruginosa NOT DETECTED NOT DETECTED   Candida albicans NOT DETECTED NOT DETECTED   Candida  glabrata NOT DETECTED NOT DETECTED   Candida krusei NOT DETECTED NOT DETECTED   Candida parapsilosis NOT DETECTED NOT DETECTED   Candida tropicalis NOT DETECTED NOT DETECTED    Corinda Gubler 03/03/2020  7:59 AM

## 2020-03-03 NOTE — Progress Notes (Signed)
NAME:  Taylor Tyler, MRN:  [id number], DOB:  October 16, 1940, LOS: 1 ADMISSION DATE:  02/04/2020, CONSULTATION DATE:  03/03/20 REFERRING MD:  EDP, CHIEF COMPLAINT:  hypotension   Brief History   79 y.o. F with PMH significant for ESRD on HD, chronic spinal infection s/p multiple debridements, diverticulitis with perforation and partial colectomy in 2015, G-tube who resides at SNF who presented to the ED with concerns that stool was in G-tube.  Found to have spinal infection and transferred to [facility] for neurosurgical evaluation.  Blood pressure was borderline, so PCCM consulted for admission  History of present illness   Taylor Tyler is a 79 y.o. F with extensive PMH including ESRD, Type 2 DM, diverticulitis with perforation and partial colectomy in 2015, G-tube, chronic spinal infections following L-spine decompression and fusion 3 yrs ago with subsequent wound infection and several unsuccessful debridements and wound VAC placements, at least 5 operations.    She resides at [facility] where there was brown substance noted in G-tube, concern for stool, so presented to [facility].   In the ED, pt met sepsis criteria with WBC 39k, lactic acid 7.9 and SBP in the 80's.  CT abdomen/pelvis revealed possible SBO and  7.3 x 3.8 cm fluid collection  in presacral space concerning for abscess.  She was given vancomycin and meropenem along with 3.5 L IV fluids and transferred to [facility] for neurosurgical evaluation.  Her case was evaluated by Dr. Kathyrn Sheriff who felt that further surgical intervention was unlikely to be successful and recommended medical treatment.  General surgery was also consulted and NG tube to be placed.  Blood pressure improved after IV fluids with MAP 65-70.  PCCM consulted for admission.  Prior [facility] 12/2019 positive for Proteus and Citrobacter resistant to cephalosporins   Past Medical History   has a past medical history of Acute blood loss anemia (03/14/2017), Acute renal failure superimposed on stage  3 chronic kidney disease ([place]) (03/14/2017), AKI (acute kidney injury) ([place]) (02/28/2017), Altered mental status (03/29/2017), Anemia, Aortic atherosclerosis ([place]), Arthritis, Bilateral lower extremity edema (03/04/2017), Bradycardia, Chronic kidney disease, CKD (chronic kidney disease) stage 3, GFR 30-59 ml/min (01/15/2017), COVID-19, Dementia ([place]), Diabetes mellitus without complication ([place]), Disease of pancreas, Diverticulitis, Diverticulosis (01/15/2017), DJD (degenerative joint disease), DM (diabetes mellitus), type 2 with renal complications ([place]) (1/79/1505), Elevated ferritin level, Fatty liver, Hypertension, Hypothyroidism, Kidney disease, chronic, stage V (GFR under 15 ml/min) ([place]), Kidney stone, Lethargy (02/28/2017), Obesity, Class III, BMI 40-49.9 (morbid obesity) ([place]) (02/27/2017), Pleural lipoma, Postoperative wound infection (04/02/2017), Spinal stenosis of lumbar region (01/15/2017), Status post lumbar surgery, and Wound healing, delayed.   Significant Hospital Events   7/30 Transfer from [facility] and admit to PCCM  Consults:  Neurosurgery  Procedures:  03/03/20 R femoral CVC placed by EDP  Significant Diagnostic Tests:  CT abdomen/pelvis:  Interval development of significant proximal small bowel dilatation is noted, with several loops of small bowel seen in large ventral hernia in the pelvis. The possibility of obstruction secondary to this herniated bowel cannot be excluded. There also appears to be a portion of transverse colon extending into large supraumbilical hernia, but it does not appear to be resulting in obstruction.  7.3 x 3.8 cm fluid collection is now seen in presacral space which contains air in its more superior portion, and is concerning for possible abscess or postoperative seroma.  Micro Data:  03/03/20 BCx2>> VRE 3 of 4 03/03/20 Wound culture>>  Antimicrobials:  Meropenem 7/30- 7/31 Vancomycin 7/30-7/31 Linezolid 7/31 >>  Interim history/subjective:    Appears comfortable on 2 L/min nasal cannula Norepinephrine weaned to 2 Blood cultures as above  Objective   Blood pressure (!) 86/73, pulse 82, temperature (!) 97.5 F (36.4 C), temperature source Axillary, resp. rate (!) 25, height '5\' 6"'  (1.676 m), weight 73.3 kg, SpO2 100 %.        Intake/Output Summary (Last 24 hours) at 03/03/2020 1355 Last data filed at 03/03/2020 1300 Gross per 24 hour  Intake 851.65 ml  Output 1300 ml  Net -448.35 ml   Filed Weights   02/20/2020 1716 03/01/2020 2200 03/03/20 0336  Weight: 88.5 kg 72.1 kg 73.3 kg   General: Chronically ill-appearing woman, laying in bed in no distress on nasal cannula HEENT: Oropharynx clear, moist Neuro: She is awake, tracks, she does not answer questions, follow commands CV: Tachycardic, regular, no murmur PULM: Comfortable respiratory pattern, no wheezes or crackles GI: Nondistended, positive bowel sounds Extremities: 2+ edema Skin: Presacral abscess as described   Resolved Hospital Problem list     Assessment & Plan:    Septic shock secondary to chronic non-healing spinal wound and likely Pre-sacral abscess Given 30 cc/kg and broad spectrum abx, per neurosurgery no surgical intervention indicated.  VRE bacteremia -Antibiotics adjusted to linezolid, follow final culture information -Follow lactic acid for clearance -Wean PIV norepinephrine as able -Wound care -Very poor prognosis for any recovery from this infection.  No neurosurgical or surgical options, IR options available  SBO Seen by surgery, likely poor surgical candidate -N.p.o., NG tube in place for decompression -Follow KUB -Appreciate surgery input   ESRD  On HD TTS -Consulted nephrology on 7/31. -No clear urgent indication for hemodialysis so will likely defer on 7/31 to allow volume resuscitation and to achieve hemodynamic stability -Follow electrolytes  Type 2 DM -Sliding Scale insulin as ordered  Dementia Plan restart Aricept when  able to tolerate p.o.  Hypothyroidism -Levothyroxine IV  Best practice:  Diet: NPO Pain/Anxiety/Delirium protocol (if indicated): Morphine VAP protocol (if indicated): n/a DVT prophylaxis: heparin GI prophylaxis: n/a Glucose control: SSI Mobility: bed rest Code Status: full code Family Communication: son updated  Disposition: ICU  Labs   CBC: Recent Labs  Lab 02/18/2020 0929 02/05/2020 1732 02/17/2020 2303 03/03/20 0351  WBC 39.1*  --  48.7* 45.7*  NEUTROABS 33.0*  --   --   --   HGB 11.3* 12.2 10.3* 9.7*  HCT 36.4 36.0 33.9* 32.1*  MCV 102.0*  --  102.1* 102.2*  PLT 257  --  213 761    Basic Metabolic Panel: Recent Labs  Lab 02/05/2020 0929 02/04/2020 1732 02/26/2020 2303 03/03/20 0351  NA 135 135  --  136  K 4.0 3.5  --  3.4*  CL 95*  --   --  99  CO2 21*  --   --  23  GLUCOSE 134*  --   --  147*  BUN 36*  --   --  43*  CREATININE 2.17*  --  2.30* 2.35*  CALCIUM 9.0  --   --  8.6*  MG  --   --   --  2.4  PHOS  --   --   --  4.4   GFR: Estimated Creatinine Clearance: 20.9 mL/min (A) (by C-G formula based on SCr of 2.35 mg/dL (H)). Recent Labs  Lab 02/23/2020 0929 02/09/2020 0929 02/19/2020 1129 02/24/2020 1359 02/16/2020 1722 02/23/2020 2303 03/03/20 0351  WBC 39.1*  --   --   --   --  48.7* 45.7*  LATICACIDVEN 6.3*   < > 7.9* 6.4* 5.0* 3.5*  --    < > = values in this interval not displayed.    Liver Function Tests: Recent Labs  Lab 02/10/2020 0929  AST 40  ALT 22  ALKPHOS 90  BILITOT 0.7  PROT 5.9*  ALBUMIN 2.0*   No results for input(s): LIPASE, AMYLASE in the last 168 hours. Recent Labs  Lab 02/27/2020 0929  AMMONIA 18    ABG    Component Value Date/Time   PHART 7.448 03/29/2017 2052   PCO2ART 30.7 (L) 03/29/2017 2052   PO2ART 75.0 (L) 03/29/2017 2052   HCO3 22.2 02/19/2020 1732   TCO2 23 03/01/2020 1732   ACIDBASEDEF 2.0 03/29/2017 2052   O2SAT 83.0 03/01/2020 1732     Coagulation Profile: Recent Labs  Lab 02/12/2020 0929  INR 1.6*     Cardiac Enzymes: No results for input(s): CKTOTAL, CKMB, CKMBINDEX, TROPONINI in the last 168 hours.  HbA1C: HbA1c, POC (prediabetic range)  Date/Time Value Ref Range Status  02/23/2018 02:54 PM 5.7 5.7 - 6.4 % Final   Hgb A1c MFr Bld  Date/Time Value Ref Range Status  03/03/2020 03:51 AM 4.1 (L) 4.8 - 5.6 % Final    Comment:    (NOTE) Pre diabetes:          5.7%-6.4%  Diabetes:              >6.4%  Glycemic control for   <7.0% adults with diabetes   11/19/2019 11:11 AM 4.7 (L) 4.8 - 5.6 % Final    Comment:    (NOTE) Pre diabetes:          5.7%-6.4% Diabetes:              >6.4% Glycemic control for   <7.0% adults with diabetes     CBG: Recent Labs  Lab 02/18/2020 2303 03/03/20 0323 03/03/20 0349 03/03/20 0737 03/03/20 1144  GLUCAP 87 62* 152* 75 100*      Critical care time: 32 minutes     Baltazar Apo, MD, PhD 03/03/2020, 2:02 PM [facility] [phone] or if no answer [phone]

## 2020-03-03 NOTE — Progress Notes (Signed)
79 y.o, female inpatient transferred from [facility]. History of dementia,  ESRD on HD, spinal stenosis of the lumbar region, chronic spinal infections with L spine decompression s/p  fusions and multiple debridements. Patient presented to the ED at [facility] from SNF with concerns of  stool in her gastrostomy tube. The patient was found to have a spinal infection and was transferred to [facility] for neurosurgical evaluation. Upon arrival to [facility] patient was noted to be in septic shock secondary to acute on chronic sacral abscesses,  MOD with AMS.  WBC-  45.7. BUN - 43, Cr - 2.35, potassium -  3.4. Lactic acid from 7.30.21 - 3..  Blood cultures negative to date. CT abdomen pelvis from 7.30.21 shows proximal small bowel dilation with possible obstruction. Previously on 5.17.21 IR aspirated a  paraspinal fluid collection.  Team is requesting evaluation for possible drain placement.   The procedure request and correlating CT L Spine and CT abd pelvis from 7.30.21  by IR Attending Dr. Pascal Lux. Imaging reflects the presacral fluid collection is inaccessible percutaneously and the posterior fluid collection would have little therapeutic benefit as the wound is currently draining. Due to this the  Patient is not a candidate for percutaneous access. This was communicated directly to the Team by the IR Attending.

## 2020-03-03 NOTE — Consult Note (Signed)
[facility] Renal Consultation Note    Indication for Consultation:  Management of ESRD/hemodialysis; anemia, hypertension/volume and secondary hyperparathyroidism PCP: followed at [facility] in [place]  HPI: This is the fourth admission since April 1st for  Taylor Tyler is a 79 y.o. female with ESRD on TTS dialysis in [place] who initially presented to [facility] from her SNF  for AMS with feculent material from [place].  She was transferred to [facility] ED in septic shock.  She was resuscitated in the ED receiving 3.5 L IVF and antibiotics.  NS evaluated given prior hx of  LS infections after previous fusion and multiple debridements and recommended medical treatment only. .  CT of the abdomen was concerning for SBO .  She was subsequently evaluated by general surgery who recommended conservative treatment with placement of NG tube.  Her last dialysis was 7/29 pre HD BP was in usual range at 175/78 with post BP lower than usual 118/62.  Temp was 98.2.  Net UF was 1.3 with lowest BP 97/62. Post wt 74.7 (EDW 74).  Noted to have large BP drops during most HD treatments into 80 - 90s . She was admitted to the ICU and we are consulted to evaluate for dialysis needs as this is her usual dialysis days.    PMHx is also significant for DM, diverticulitis with prior perf and partial colectomy 2015, demential, COVID 10/2019, morbid obesity, chronic G tube feedings. Prior [facility] in May were positive for proteus and citrobacter.  BCs drawn 7/30 are positive for VRE.  Labs today Na 136 K 3.6 CO2 23 glu 147 BUN 43 Cr 2.35 Ca 8.6 P 4.4 QBC 45.7 hgb 9.7 plts 195.  CXR 7/30 minimal fluid. SBP today 80 - 90s. sats ok   Past Medical History:  Diagnosis Date  . Acute blood loss anemia 03/14/2017  . Acute renal failure superimposed on stage 3 chronic kidney disease ([place]) 03/14/2017  . AKI (acute kidney injury) ([place]) 02/28/2017  . Altered mental status 03/29/2017  . Anemia   . Aortic atherosclerosis ([place])   .  Arthritis    "joints might ache at times; not that bad" (06/10/2018)  . Bilateral lower extremity edema 03/04/2017  . Bradycardia   . Chronic kidney disease    ?? renal insufficiency,   . CKD (chronic kidney disease) stage 3, GFR 30-59 ml/min 01/15/2017   ?? renal insufficiency, which she thinks is coming from "all these medications"  . COVID-19    10/17/19  . Dementia ([place])   . Diabetes mellitus without complication ([place])    diagnosed 4-5 yrs ago, 06/21/18- "that was years ago"  . Disease of pancreas   . Diverticulitis    s/p perforation and partial colectomy 01/27/14 with 3 benign lymph nodes   . Diverticulosis 01/15/2017  . DJD (degenerative joint disease)   . DM (diabetes mellitus), type 2 with renal complications ([place]) 0/76/2263  . Elevated ferritin level   . Fatty liver   . Hypertension   . Hypothyroidism    "had radiation" (06/10/2018)  . Kidney disease, chronic, stage V (GFR under 15 ml/min) ([place])    per notes from nursing home-she is on dialysis  . Kidney stone   . Lethargy 02/28/2017  . Obesity, Class III, BMI 40-49.9 (morbid obesity) ([place]) 02/27/2017  . Pleural lipoma   . Postoperative wound infection 04/02/2017  . Spinal stenosis of lumbar region 01/15/2017  . Status post lumbar surgery   . Wound healing, delayed  back   Past Surgical History:  Procedure Laterality Date  . ABDOMINAL EXPOSURE N/A 02/24/2017   Procedure: ABDOMINAL EXPOSURE;  Surgeon: Angelia Mould, MD;  Location: [place];  Service: Vascular;  Laterality: N/A;  . ABDOMINAL HYSTERECTOMY  1987   no h/o abnormal paps   . ANTERIOR LAT LUMBAR FUSION N/A 02/24/2017   Procedure: Lumbar three- five Anterior lateral lumbar interbody fusion;  Surgeon: Ditty, Kevan Ny, MD;  Location: [place];  Service: Neurosurgery;  Laterality: N/A;  L3-5 Anterior lateral lumbar interbody fusion with removal of coflex at L3-4, L4-5  . ANTERIOR LUMBAR FUSION N/A 02/24/2017   Procedure: Stage 1: Lumbar five-Sacral one Anterior  lumbar interbody fusion;  Surgeon: Ditty, Kevan Ny, MD;  Location: [place];  Service: Neurosurgery;  Laterality: N/A;  Stage 1: L5-S1 Anterior lumbar interbody fusion  . APPENDECTOMY    . APPLICATION OF ROBOTIC ASSISTANCE FOR SPINAL PROCEDURE N/A 02/26/2017   Procedure: APPLICATION OF ROBOTIC ASSISTANCE FOR SPINAL PROCEDURE;  Surgeon: Ditty, Kevan Ny, MD;  Location: [place];  Service: Neurosurgery;  Laterality: N/A;  . APPLICATION OF WOUND VAC N/A 03/30/2017   Procedure: APPLICATION OF WOUND VAC;  Surgeon: Ditty, Kevan Ny, MD;  Location: [place];  Service: Neurosurgery;  Laterality: N/A;  . APPLICATION OF WOUND VAC N/A 06/23/2019   Procedure: APPLICATION OF WOUND VAC;  Surgeon: Consuella Lose, MD;  Location: [place];  Service: Neurosurgery;  Laterality: N/A;  . AV FISTULA PLACEMENT Left 06/08/2019   Procedure: Arteriovenous (Av) Fistula Creation;  Surgeon: Rosetta Posner, MD;  Location: [place];  Service: Vascular;  Laterality: Left;  . BACK SURGERY  2013  . CATARACT EXTRACTION W/ INTRAOCULAR LENS IMPLANT Right   . CHOLECYSTECTOMY OPEN  1982  . COLON SURGERY  01/26/2014   desc.sigmoid colectomy and ventral hernia repair and splenic flexure mobilization  . COLON SURGERY    . DILATION AND CURETTAGE OF UTERUS    . ESOPHAGOGASTRODUODENOSCOPY  12/24/2019   normal with PEG placement Dr. Alice Reichert  . HERNIA REPAIR    . INSERTION OF DIALYSIS CATHETER Right 06/08/2019   Procedure: INSERTION OF DIALYSIS CATHETER RIGHT INTERNAL JUGULAR;  Surgeon: Rosetta Posner, MD;  Location: [place];  Service: Vascular;  Laterality: Right;  . LUMBAR LAMINECTOMY WITH SPINOUS PROCESS PLATE 2 LEVEL N/A 04/04/5175   Procedure: LUMBAR LAMINECTOMY/DECOMPRESSION MICRODISCECTOMY CoFlex;  Surgeon: Faythe Ghee, MD;  Location: [facility];  Service: Neurosurgery;  Laterality: N/A;  Lumbar three-four,Lumbar Four-Five Laminectomy with Coflex  . LUMBAR WOUND DEBRIDEMENT N/A 03/30/2017   Procedure: Lumbar wound  exploration/debridement, placement of wound vac;  Surgeon: Ditty, Kevan Ny, MD;  Location: [place];  Service: Neurosurgery;  Laterality: N/A;  Lumbar wound exploration/debridement, placement of wound vac  . LUMBAR WOUND DEBRIDEMENT N/A 06/06/2018   Procedure: LUMBAR WOUND DEBRIDEMENT/EXPLORATION;  Surgeon: Consuella Lose, MD;  Location: [place];  Service: Neurosurgery;  Laterality: N/A;  . LUMBAR WOUND DEBRIDEMENT N/A 06/22/2018   Procedure: SIMPLE INCISION AND DRAINAGE OF WOUND, APPLICATION OF WOUND VAC;  Surgeon: Consuella Lose, MD;  Location: [place];  Service: Neurosurgery;  Laterality: N/A;  . LUMBAR WOUND DEBRIDEMENT N/A 06/23/2019   Procedure: LUMBAR WOUND DEBRIDEMENT WITH HARDWARE REMOVAL;  Surgeon: Consuella Lose, MD;  Location: [place];  Service: Neurosurgery;  Laterality: N/A;  . PARTIAL COLECTOMY     01/27/14 diverticulitis and 3 benign lymph nodes [facility] Dr. Pat Patrick   . PEG PLACEMENT N/A 12/24/2019   Procedure: PERCUTANEOUS ENDOSCOPIC GASTROSTOMY (PEG) PLACEMENT;  Surgeon: Isleton, Chance  K, MD;  Location: [facility];  Service: Gastroenterology;  Laterality: N/A;  . REDUCTION MAMMAPLASTY Bilateral 1992  . THROMBECTOMY BRACHIAL ARTERY Left 06/08/2019   Procedure: Thrombectomy Brachial Artery;  Surgeon: Rosetta Posner, MD;  Location: [place];  Service: Vascular;  Laterality: Left;  . TUBAL LIGATION    . VENTRAL HERNIA REPAIR  2014   Family History  Problem Relation Age of Onset  . Diabetes Father   . Heart attack Father   . Hypertension Father   . Heart disease Father   . Kidney disease Father   . Diabetes Mother   . Hypertension Mother   . Heart disease Mother   . Kidney disease Mother   . Lupus Sister   . Heart disease Brother   . Diabetes Brother   . Heart disease Brother   . Diabetes Brother   . Lupus Sister    Social History:  reports that she has never smoked. She has never used smokeless tobacco. She reports previous alcohol use. She reports that she does not  use drugs. Allergies  Allergen Reactions  . Clams [Shellfish Allergy] Swelling and Other (See Comments)    THROAT SWELLS NECK TURNS RED  . Hydralazine Other (See Comments)    CHEST TIGHTNESS Patient has tolerated multiple doses of hydralazine since allergy was listed   . Norvasc [Amlodipine Besylate] Swelling    Leg edema   . Milk-Related Compounds Diarrhea   Prior to Admission medications   Medication Sig Start Date End Date Taking? Authorizing Provider  acetaminophen (TYLENOL) 500 MG tablet Take 500 mg by mouth daily as needed for mild pain.    Yes [provider]  Amino Acids-Protein Hydrolys (FEEDING SUPPLEMENT, PRO-STAT SUGAR FREE 64,) LIQD Place 30 mLs into feeding tube 2 (two) times daily. 12/30/19  Yes Jennye Boroughs, MD  atorvastatin (LIPITOR) 40 MG tablet Take 1 tablet (40 mg total) by mouth daily at 6 PM. 09/03/18  Yes McLean-Scocuzza, Nino Glow, MD  B Complex-C (B-COMPLEX WITH VITAMIN C) tablet Place 1 tablet into feeding tube at bedtime. 12/30/19  Yes Jennye Boroughs, MD  ciprofloxacin (CIPRO) 500 MG tablet Take 500 mg by mouth 2 (two) times daily. 02/28/20 03/16/20 Yes [provider]  citalopram (CELEXA) 20 MG tablet Take 20 mg by mouth daily.    Yes [provider]  collagenase (SANTYL) ointment Apply topically daily. 11/24/19  Yes Nicole Kindred A, DO  divalproex (DEPAKOTE SPRINKLE) 125 MG capsule Take 125 mg by mouth 2 (two) times daily.    Yes [provider]  donepezil (ARICEPT) 5 MG tablet Take 5 mg by mouth at bedtime.   Yes [provider]  folic acid (FOLVITE) 626 MCG tablet Take 400 mcg by mouth daily.    Yes [provider]  gabapentin (NEURONTIN) 100 MG capsule Take 2 capsules (200 mg total) by mouth at bedtime. 07/04/19  Yes Regalado, Belkys A, MD  HYDROcodone-acetaminophen (NORCO/VICODIN) 5-325 MG tablet Take 1 tablet by mouth every 6 (six) hours as needed for moderate pain or severe pain.   Yes [provider]  Lactobacillus (ACIDOPHILUS PO) Take 175 mg by mouth 2 (two) times daily.   Yes [provider]  lactulose, encephalopathy, (GENERLAC) 10 GM/15ML SOLN Take 10 g by mouth daily.   Yes [provider]  levothyroxine (SYNTHROID, LEVOTHROID) 100 MCG tablet Take 100 mcg by mouth daily before breakfast.   Yes [provider]  meclizine (ANTIVERT) 12.5 MG tablet Take 1 tablet (12.5 mg total)  by mouth daily as needed for dizziness. 11/09/19  Yes Enzo Bi, MD  memantine (NAMENDA) 5 MG tablet Take 5 mg by mouth 2 (two) times daily.   Yes [provider]  midodrine (PROAMATINE) 5 MG tablet Place 1 tablet (5 mg total) into feeding tube 3 (three) times daily with meals. 12/30/19  Yes Jennye Boroughs, MD  Nutritional Supplements (FEEDING SUPPLEMENT, JEVITY 1.5 CAL/FIBER,) LIQD Place 1,000 mLs into feeding tube continuous. 12/30/19  Yes Jennye Boroughs, MD  risperiDONE (RISPERDAL) 0.25 MG tablet Take 0.25 mg by mouth daily.    Yes [provider]  sulfamethoxazole-trimethoprim (BACTRIM DS) 800-160 MG tablet Take 1 tablet by mouth 2 (two) times daily.   Yes [provider]  traMADol (ULTRAM) 50 MG tablet Take 50 mg by mouth every 6 (six) hours as needed for moderate pain.   Yes [provider]  epoetin alfa (EPOGEN) 4000 UNIT/ML injection Inject 1 mL (4,000 Units total) into the skin Every Tuesday,Thursday,and Saturday with dialysis. Patient not taking: Reported on 02/27/2020 11/09/19   Enzo Bi, MD   Current Facility-Administered Medications  Medication Dose Route Frequency Provider Last Rate Last Admin  . 0.9 %  sodium chloride infusion  250 mL Intravenous Continuous Gleason, Otilio Carpen, PA-C   Held at 02/15/2020 1926  . chlorhexidine (PERIDEX) 0.12 % solution 15 mL  15 mL Mouth Rinse BID Margaretha Seeds, MD   15 mL at 02/06/2020 2342  . Chlorhexidine Gluconate Cloth 2 % PADS 6 each  6 each Topical Q0600 Margaretha Seeds, MD   6 each at 02/05/2020 2238  .  docusate sodium (COLACE) capsule 100 mg  100 mg Oral BID PRN Gleason, Otilio Carpen, PA-C      . heparin injection 5,000 Units  5,000 Units Subcutaneous Q8H Gleason, Otilio Carpen, PA-C   5,000 Units at 03/03/20 0555  . insulin aspart (novoLOG) injection 0-6 Units  0-6 Units Subcutaneous Q4H Gleason, Otilio Carpen, PA-C      . [START ON 03/05/2020] levothyroxine (SYNTHROID, LEVOTHROID) injection 50 mcg  50 mcg Intravenous Daily Gleason, Otilio Carpen, PA-C      . linezolid (ZYVOX) IVPB 600 mg  600 mg Intravenous Q12H Collene Gobble, MD      . MEDLINE mouth rinse  15 mL Mouth Rinse q12n4p Margaretha Seeds, MD      . morphine 2 MG/ML injection 1 mg  1 mg Intravenous Q4H PRN Gleason, Otilio Carpen, PA-C      . norepinephrine (LEVOPHED) 4mg  in 275mL premix infusion  0-40 mcg/min Intravenous Titrated Anders Simmonds, MD 7.5 mL/hr at 03/03/20 0800 2 mcg/min at 03/03/20 0800  . polyethylene glycol (MIRALAX / GLYCOLAX) packet 17 g  17 g Oral Daily PRN Gleason, Otilio Carpen, PA-C      . sodium chloride flush (NS) 0.9 % injection 10-40 mL  10-40 mL Intracatheter Q12H Margaretha Seeds, MD   20 mL at 02/07/2020 2313  . sodium chloride flush (NS) 0.9 % injection 10-40 mL  10-40 mL Intracatheter PRN Margaretha Seeds, MD       Labs: Basic Metabolic Panel: Recent Labs  Lab 02/19/2020 0929 02/21/2020 1732 02/05/2020 2303 03/03/20 0351  NA 135 135  --  136  K 4.0 3.5  --  3.4*  CL 95*  --   --  99  CO2 21*  --   --  23  GLUCOSE 134*  --   --  147*  BUN 36*  --   --  43*  CREATININE 2.17*  --  2.30* 2.35*  CALCIUM 9.0  --   --  8.6*  PHOS  --   --   --  4.4   Liver Function Tests: Recent Labs  Lab 02/27/2020 0929  AST 40  ALT 22  ALKPHOS 90  BILITOT 0.7  PROT 5.9*  ALBUMIN 2.0*   No results for input(s): LIPASE, AMYLASE in the last 168 hours. Recent Labs  Lab 02/25/2020 0929  AMMONIA 18   CBC: Recent Labs  Lab 02/04/2020 0929 02/11/2020 0929 02/11/2020 1732 02/20/2020 2303 03/03/20 0351  WBC 39.1*  --   --  48.7* 45.7*  NEUTROABS  33.0*  --   --   --   --   HGB 11.3*   < > 12.2 10.3* 9.7*  HCT 36.4   < > 36.0 33.9* 32.1*  MCV 102.0*  --   --  102.1* 102.2*  PLT 257  --   --  213 195   < > = values in this interval not displayed.   Cardiac Enzymes: No results for input(s): CKTOTAL, CKMB, CKMBINDEX, TROPONINI in the last 168 hours. CBG: Recent Labs  Lab 02/22/2020 1535 02/11/2020 2303 03/03/20 0323 03/03/20 0349 03/03/20 0737  GLUCAP 87 87 62* 152* 75   Iron Studies: No results for input(s): IRON, TIBC, TRANSFERRIN, FERRITIN in the last 72 hours. Studies/Results: CT ABDOMEN PELVIS WO CONTRAST  Result Date: 03/01/2020 CLINICAL DATA:  Fecal material in feeding tube.  Sacral wounds. EXAM: CT ABDOMEN AND PELVIS WITHOUT CONTRAST TECHNIQUE: Multidetector CT imaging of the abdomen and pelvis was performed following the standard protocol without IV contrast. COMPARISON:  Dec 11, 2019. FINDINGS: Lower chest: No acute abnormality. Hepatobiliary: No focal liver abnormality is seen. Status post cholecystectomy. No biliary dilatation. Pancreas: Unremarkable. No pancreatic ductal dilatation or surrounding inflammatory changes. Spleen: Normal in size without focal abnormality. Adrenals/Urinary Tract: Adrenal glands appear normal. Nonobstructive left renal calculus is noted. No hydronephrosis or renal obstruction is noted. Urinary bladder is decompressed. Stomach/Bowel: Gastrostomy tube is seen within the gastric lumen. The stomach is mild-to-moderately distended and fluid-filled. There is interval development of significant proximal small bowel dilatation is noted, with several loops of small bowel seen in large ventral hernia in the pelvis. The possibility of obstruction secondary to this herniated bowel cannot be excluded. There also appears to be a portion of transverse colon extending into large supraumbilical hernia, but it does not appear to be resulting in obstruction. Vascular/Lymphatic: Aortic atherosclerosis. No enlarged abdominal  or pelvic lymph nodes. Reproductive: Status post hysterectomy. No adnexal masses. Other: 7.3 x 3.8 cm fluid collection is now seen in presacral space which contains air in its more superior portion, and is concerning for possible abscess or postoperative seroma. Large decubitus ulcer is seen overlying the distal portion of the sacrum and extending inferiorly, which is significantly enlarged compared to prior exam. Musculoskeletal: Extensive postsurgical changes are seen involving the lumbar spine, including laminectomies of L4, L5 and S1. Status post surgical anterior fusion of L5-S1. Patient appears to have undergone intrapedicular screw removal at the L3, L4 and L5 levels. There is a large fluid collection with air-fluid level in the soft tissues posterior to the lower lumbar spine extending from L3-S1. IMPRESSION: 1. Interval development of significant proximal small bowel dilatation is noted, with several loops of small bowel seen in large ventral hernia in the pelvis. The possibility of obstruction secondary to this herniated bowel cannot be excluded. 2. Significant enlargement of decubitus ulcer  which is seen primarily just inferior to the sacrum posteriorly. 3. There also appears to be a portion of transverse colon extending into large supraumbilical hernia, but it does not appear to be resulting in obstruction. 4. 7.3 x 3.8 cm fluid collection is now seen in presacral space which contains air in its more superior portion, and is concerning for possible abscess or postoperative seroma. 5. Extensive postsurgical changes are seen involving the lumbar spine, including laminectomies of L4, L5 and S1. Patient appears to have undergone intrapedicular screw removal at the L3, L4 and L5 levels. There is a large fluid collection with air-fluid level in the soft tissues posterior to the lower lumbar spine extending from L3-S1. This is not significantly changed compared to prior exam. 6. Nonobstructive left renal  calculus. No hydronephrosis or renal obstruction is noted. Aortic Atherosclerosis (ICD10-I70.0). Electronically Signed   By: Marijo Conception M.D.   On: 02/02/2020 10:42   CT Head Wo Contrast  Result Date: 02/13/2020 CLINICAL DATA:  Altered mental status. EXAM: CT HEAD WITHOUT CONTRAST TECHNIQUE: Contiguous axial images were obtained from the base of the skull through the vertex without intravenous contrast. COMPARISON:  12/11/2019 FINDINGS: Brain: Generalized atrophy. Chronic small-vessel ischemic changes of the cerebral hemispheric white matter. No sign of recent infarction, mass lesion, hemorrhage, hydrocephalus or extra-axial collection. Vascular: There is atherosclerotic calcification of the major vessels at the base of the brain. Skull: Negative Sinuses/Orbits: Clear/normal Other: None IMPRESSION: No acute intracranial finding by CT. Atrophy and chronic small-vessel ischemic changes as seen previously. Electronically Signed   By: Nelson Chimes M.D.   On: 02/08/2020 10:35   CT L-SPINE NO CHARGE  Result Date: 02/03/2020 CLINICAL DATA:  Altered mental status.  Back pain. EXAM: CT LUMBAR SPINE WITHOUT CONTRAST TECHNIQUE: Multidetector CT imaging of the lumbar spine was performed without intravenous contrast administration. Multiplanar CT image reconstructions were also generated. COMPARISON:  12/19/2019.  06/17/2019. FINDINGS: Segmentation: 5 lumbar type vertebral bodies as numbered previously. Alignment: Anterolisthesis at L4-5 of 8 mm now present. Vertebrae: Anterior fusion at L5-S1. Removal of posterior hardware previously present from L3 to the sacrum. Screw shadows remain visible. Probable solid union at L3-4. Probable nonunion at L4-5 with partial anterior extrusion of the interbody spacer. Some endplate irregularity. Possibility of infection is not excluded at this level. Solid union at L5-S1. Paraspinal and other soft tissues: Fluid collection posteriorly from L3 to the sacrum, containing air or gas,  worrisome for infection absent recent intervention. Disc levels: No significant finding seen at T12-L1 or L1-2. L2-3: Disc bulge. Facet degeneration and hypertrophy. Stenosis of the lateral recesses. L3 to sacrum: Previous posterior decompression and surgical changes as above. IMPRESSION: Status post posterior fusion hardware removal from L3 to the sacrum. Previous anterior discectomy and fusion procedure L5-S1 appears solid. L3-4 fusion appears solid. Probable nonunion at L4-5 with development of 8 mm of anterolisthesis and partial anterior extrusion of the interbody spacer. Some endplate irregularity. Infection at this level not excluded. Posterior fluid collection in the region of the surgery with air/gas, suggesting possibility of infection, absent performance of recent surgery. Electronically Signed   By: Nelson Chimes M.D.   On: 02/14/2020 10:34   DG Chest Port 1 View  Result Date: 03/01/2020 CLINICAL DATA:  Altered mental status, end-stage renal disease, diabetes mellitus, hypertension EXAM: PORTABLE CHEST 1 VIEW COMPARISON:  Portable exam 0939 hours compared to 12/12/2019 FINDINGS: RIGHT jugular dual-lumen catheter with tip projecting over RIGHT atrium. Enlargement of cardiac silhouette with pulmonary  vascular congestion. Atherosclerotic calcification aorta. Decreased lung volumes with bibasilar atelectasis. Slight crowding of markings in the perihilar regions versus minimal pulmonary edema. No pleural effusion or pneumothorax. IMPRESSION: Decreased lung volumes with bibasilar atelectasis. Enlargement of cardiac silhouette. Cannot exclude minimal perihilar edema. Electronically Signed   By: Lavonia Dana M.D.   On: 02/09/2020 09:48   DG Abd Portable 1V-Small Bowel Protocol-Position Verification  Result Date: 02/06/2020 CLINICAL DATA:  NG tube placement. EXAM: PORTABLE ABDOMEN - 1 VIEW COMPARISON:  CT earlier this day. FINDINGS: Tip and side port of the enteric tube below the diaphragm in the stomach.  Gastrostomy tube projects over the left upper quadrant. Dialysis catheter in the right lower chest. IMPRESSION: Tip and side port of the enteric tube below the diaphragm in the stomach. Electronically Signed   By: Keith Rake M.D.   On: 02/05/2020 23:59    ROS: As per HPI. Patient unable to communicate  Physical Exam: Vitals:   03/03/20 0715 03/03/20 0730 03/03/20 0800 03/03/20 0900  BP: (!) 100/61 (!) 100/63    Pulse:    (!) 185  Resp: 18 18  18   Temp:   (!) 97.4 F (36.3 C)   TempSrc:   Axillary   SpO2:   99%   Weight:      Height:         General: obese ill appearing female supine in bed Head: NCAT sclera not icteric MMM  Neck: Supple.  Lungs: CTA bilaterally anteriorly  Breathing is unlabored. Heart: RRR with S1 S2.  Abdomen: soft NT slightly distended chronic  ventral hernia - Gtube LUQ very mild dependent edema  Lower extremities: + edema - protective booties, toes cool, SCDs in place  Neuro: nonverbal, some seemingly involuntary mouth movement/nonambulatory at baseline Dialysis Access: right IJ [facility] - cath exit without drainage or erythema  Dialysis Orders: TTS [place] 4 hr EDW 74 2K 2.5 Ca 400/800 right IJ TDC heparin 5000 Retacrit 20,000 U 3 x a week no VDRA  Recent labs hgb 10.7 tsat 18% ferritin 1127   Assessment/Plan: 1. Septic shock secondary to VRE sepsis - IV antibiotics per CCM/pharmacy - narrowed to Zyvox 2. Probably SBO - NG placed - npo - seen by surgery - conservative management 3. Chronic Nonhealing sacral and back wounds (CT showed fluid collection in presacral space with purulent drainage - seen by NS/wound care RN  - conservative management local care/antibiotics 4. ESRD -  TTS - no acute need for dialysis today..  Will assess on a day by day basis.  Discussed with Dr. Lamonte Sakai. Limit fluids as much as possible. 5. Hypertension/volume  - BP drops have been limiting UF during outpt HD treatments - has been able to get to EDW at times -  6. Anemia  - hgb  9.7 slight down - on Retacrit as an outpatient - not clear why - will give Aranesp here - 7. Metabolic bone disease -  Outpatient Ca/P iPTH ok without binders or VDRA 8. Nutrition - alb low - NPO for now - prior GTube feeds 9. Dementia - prior to current admission - noted to have no insight into current condition 10. Goals of care - son has rescinded prior DNR and wishes full scope of care   Myriam Jacobson, PA-C [place] [phone] 03/03/2020, 9:18 AM

## 2020-03-03 NOTE — Progress Notes (Signed)
Hypoglycemic Event  CBG: 62  Treatment: D50 25 mL (12.5 gm)  Symptoms: Shaky  Follow-up CBG: [id number] CBG Result:152  Possible Reasons for Event: Inadequate meal intake     Levonne Hubert

## 2020-03-03 NOTE — Progress Notes (Signed)
Subjective/Chief Complaint: Not very communicative   Objective: Vital signs in last 24 hours: Temp:  [96.7 F (35.9 C)-98.6 F (37 C)] 97.5 F (36.4 C) (07/31 1142) Pulse Rate:  [32-185] 90 (07/31 1215) Resp:  [14-31] 16 (07/31 1215) BP: (79-114)/(39-78) 93/59 (07/31 1215) SpO2:  [77 %-100 %] 100 % (07/31 1215) Weight:  [72.1 kg-88.5 kg] 73.3 kg (07/31 0336) Last BM Date:  (PTA)  Intake/Output from previous day: 07/30 0701 - 07/31 0700 In: 506.6 [I.V.:416.6; NG/GT:90] Out: 1300 [Emesis/NG output:1300] Intake/Output this shift: Total I/O In: 330 [I.V.:30; IV Piggyback:300] Out: -   GI: mild distention, g tube in place, hernias reducible, not many bs  Lab Results:  Recent Labs    02/10/2020 2303 03/03/20 0351  WBC 48.7* 45.7*  HGB 10.3* 9.7*  HCT 33.9* 32.1*  PLT 213 195   BMET Recent Labs    02/06/2020 0929 02/02/2020 0929 02/27/2020 1732 02/20/2020 2303 03/03/20 0351  NA 135   < > 135  --  136  K 4.0   < > 3.5  --  3.4*  CL 95*  --   --   --  99  CO2 21*  --   --   --  23  GLUCOSE 134*  --   --   --  147*  BUN 36*  --   --   --  43*  CREATININE 2.17*   < >  --  2.30* 2.35*  CALCIUM 9.0  --   --   --  8.6*   < > = values in this interval not displayed.   PT/INR Recent Labs    02/23/2020 0929  LABPROT 18.0*  INR 1.6*   ABG Recent Labs    02/13/2020 1732  HCO3 22.2    Studies/Results: CT ABDOMEN PELVIS WO CONTRAST  Result Date: 02/25/2020 CLINICAL DATA:  Fecal material in feeding tube.  Sacral wounds. EXAM: CT ABDOMEN AND PELVIS WITHOUT CONTRAST TECHNIQUE: Multidetector CT imaging of the abdomen and pelvis was performed following the standard protocol without IV contrast. COMPARISON:  Dec 11, 2019. FINDINGS: Lower chest: No acute abnormality. Hepatobiliary: No focal liver abnormality is seen. Status post cholecystectomy. No biliary dilatation. Pancreas: Unremarkable. No pancreatic ductal dilatation or surrounding inflammatory changes. Spleen: Normal in  size without focal abnormality. Adrenals/Urinary Tract: Adrenal glands appear normal. Nonobstructive left renal calculus is noted. No hydronephrosis or renal obstruction is noted. Urinary bladder is decompressed. Stomach/Bowel: Gastrostomy tube is seen within the gastric lumen. The stomach is mild-to-moderately distended and fluid-filled. There is interval development of significant proximal small bowel dilatation is noted, with several loops of small bowel seen in large ventral hernia in the pelvis. The possibility of obstruction secondary to this herniated bowel cannot be excluded. There also appears to be a portion of transverse colon extending into large supraumbilical hernia, but it does not appear to be resulting in obstruction. Vascular/Lymphatic: Aortic atherosclerosis. No enlarged abdominal or pelvic lymph nodes. Reproductive: Status post hysterectomy. No adnexal masses. Other: 7.3 x 3.8 cm fluid collection is now seen in presacral space which contains air in its more superior portion, and is concerning for possible abscess or postoperative seroma. Large decubitus ulcer is seen overlying the distal portion of the sacrum and extending inferiorly, which is significantly enlarged compared to prior exam. Musculoskeletal: Extensive postsurgical changes are seen involving the lumbar spine, including laminectomies of L4, L5 and S1. Status post surgical anterior fusion of L5-S1. Patient appears to have undergone intrapedicular screw removal at the  L3, L4 and L5 levels. There is a large fluid collection with air-fluid level in the soft tissues posterior to the lower lumbar spine extending from L3-S1. IMPRESSION: 1. Interval development of significant proximal small bowel dilatation is noted, with several loops of small bowel seen in large ventral hernia in the pelvis. The possibility of obstruction secondary to this herniated bowel cannot be excluded. 2. Significant enlargement of decubitus ulcer which is seen  primarily just inferior to the sacrum posteriorly. 3. There also appears to be a portion of transverse colon extending into large supraumbilical hernia, but it does not appear to be resulting in obstruction. 4. 7.3 x 3.8 cm fluid collection is now seen in presacral space which contains air in its more superior portion, and is concerning for possible abscess or postoperative seroma. 5. Extensive postsurgical changes are seen involving the lumbar spine, including laminectomies of L4, L5 and S1. Patient appears to have undergone intrapedicular screw removal at the L3, L4 and L5 levels. There is a large fluid collection with air-fluid level in the soft tissues posterior to the lower lumbar spine extending from L3-S1. This is not significantly changed compared to prior exam. 6. Nonobstructive left renal calculus. No hydronephrosis or renal obstruction is noted. Aortic Atherosclerosis (ICD10-I70.0). Electronically Signed   By: Marijo Conception M.D.   On: 02/20/2020 10:42   CT Head Wo Contrast  Result Date: 02/02/2020 CLINICAL DATA:  Altered mental status. EXAM: CT HEAD WITHOUT CONTRAST TECHNIQUE: Contiguous axial images were obtained from the base of the skull through the vertex without intravenous contrast. COMPARISON:  12/11/2019 FINDINGS: Brain: Generalized atrophy. Chronic small-vessel ischemic changes of the cerebral hemispheric white matter. No sign of recent infarction, mass lesion, hemorrhage, hydrocephalus or extra-axial collection. Vascular: There is atherosclerotic calcification of the major vessels at the base of the brain. Skull: Negative Sinuses/Orbits: Clear/normal Other: None IMPRESSION: No acute intracranial finding by CT. Atrophy and chronic small-vessel ischemic changes as seen previously. Electronically Signed   By: Nelson Chimes M.D.   On: 02/05/2020 10:35   CT L-SPINE NO CHARGE  Result Date: 02/02/2020 CLINICAL DATA:  Altered mental status.  Back pain. EXAM: CT LUMBAR SPINE WITHOUT CONTRAST  TECHNIQUE: Multidetector CT imaging of the lumbar spine was performed without intravenous contrast administration. Multiplanar CT image reconstructions were also generated. COMPARISON:  12/19/2019.  06/17/2019. FINDINGS: Segmentation: 5 lumbar type vertebral bodies as numbered previously. Alignment: Anterolisthesis at L4-5 of 8 mm now present. Vertebrae: Anterior fusion at L5-S1. Removal of posterior hardware previously present from L3 to the sacrum. Screw shadows remain visible. Probable solid union at L3-4. Probable nonunion at L4-5 with partial anterior extrusion of the interbody spacer. Some endplate irregularity. Possibility of infection is not excluded at this level. Solid union at L5-S1. Paraspinal and other soft tissues: Fluid collection posteriorly from L3 to the sacrum, containing air or gas, worrisome for infection absent recent intervention. Disc levels: No significant finding seen at T12-L1 or L1-2. L2-3: Disc bulge. Facet degeneration and hypertrophy. Stenosis of the lateral recesses. L3 to sacrum: Previous posterior decompression and surgical changes as above. IMPRESSION: Status post posterior fusion hardware removal from L3 to the sacrum. Previous anterior discectomy and fusion procedure L5-S1 appears solid. L3-4 fusion appears solid. Probable nonunion at L4-5 with development of 8 mm of anterolisthesis and partial anterior extrusion of the interbody spacer. Some endplate irregularity. Infection at this level not excluded. Posterior fluid collection in the region of the surgery with air/gas, suggesting possibility of infection, absent performance  of recent surgery. Electronically Signed   By: Nelson Chimes M.D.   On: 02/05/2020 10:34   DG Chest Port 1 View  Result Date: 02/26/2020 CLINICAL DATA:  Altered mental status, end-stage renal disease, diabetes mellitus, hypertension EXAM: PORTABLE CHEST 1 VIEW COMPARISON:  Portable exam 0939 hours compared to 12/12/2019 FINDINGS: RIGHT jugular dual-lumen  catheter with tip projecting over RIGHT atrium. Enlargement of cardiac silhouette with pulmonary vascular congestion. Atherosclerotic calcification aorta. Decreased lung volumes with bibasilar atelectasis. Slight crowding of markings in the perihilar regions versus minimal pulmonary edema. No pleural effusion or pneumothorax. IMPRESSION: Decreased lung volumes with bibasilar atelectasis. Enlargement of cardiac silhouette. Cannot exclude minimal perihilar edema. Electronically Signed   By: Lavonia Dana M.D.   On: 02/18/2020 09:48   DG Abd Portable 1V-Small Bowel Obstruction Protocol-initial, 8 hr delay  Result Date: 03/03/2020 CLINICAL DATA:  79 year old female with nasogastric tube present. Evaluate small bowel obstruction. EXAM: PORTABLE ABDOMEN - 1 VIEW COMPARISON:  Prior abdominal radiograph 02/29/2020 FINDINGS: Gaseous dilation of loops of small bowel in the mid abdomen measuring up to 4.8 cm in diameter. A percutaneous gastrostomy tube projects over the stomach. A central venous catheter projects over the right anatomic pelvis with the tip terminating in the region of the right common iliac vein. Incompletely imaged tunneled hemodialysis catheter with the tip projecting over the right atrium. A lateral decubitus view is included. No evidence of free air. IMPRESSION: 1. Gas-filled dilated loops of small bowel consistent with the clinical history of small-bowel obstruction. Maximal small bowel diameter 4.8 cm. 2. Percutaneous gastrostomy tube and nasogastric tube appear to be in good position. 3. Incompletely imaged right femoral approach central venous catheter and tunneled hemodialysis catheter. Electronically Signed   By: Jacqulynn Cadet M.D.   On: 03/03/2020 09:36   DG Abd Portable 1V-Small Bowel Protocol-Position Verification  Result Date: 02/29/2020 CLINICAL DATA:  NG tube placement. EXAM: PORTABLE ABDOMEN - 1 VIEW COMPARISON:  CT earlier this day. FINDINGS: Tip and side port of the enteric tube  below the diaphragm in the stomach. Gastrostomy tube projects over the left upper quadrant. Dialysis catheter in the right lower chest. IMPRESSION: Tip and side port of the enteric tube below the diaphragm in the stomach. Electronically Signed   By: Keith Rake M.D.   On: 02/17/2020 23:59    Anti-infectives: Anti-infectives (From admission, onward)   Start     Dose/Rate Route Frequency Ordered Stop   03/03/20 1000  linezolid (ZYVOX) IVPB 600 mg     Discontinue     600 mg 300 mL/hr over 60 Minutes Intravenous Every 12 hours 03/03/20 0300        Assessment/Plan: SBO vs ileus? -no indication for any surgery right now, not sure any contrast orally given -continue ng tube, will put g tube to gravity today -follow up film in am  Rolm Bookbinder 03/03/2020

## 2020-03-04 ENCOUNTER — Inpatient Hospital Stay (HOSPITAL_COMMUNITY): Payer: Medicare Other

## 2020-03-04 DIAGNOSIS — I469 Cardiac arrest, cause unspecified: Secondary | ICD-10-CM

## 2020-03-04 LAB — RENAL FUNCTION PANEL
Albumin: 1.4 g/dL — ABNORMAL LOW (ref 3.5–5.0)
Anion gap: 14 (ref 5–15)
BUN: 32 mg/dL — ABNORMAL HIGH (ref 8–23)
CO2: 24 mmol/L (ref 22–32)
Calcium: 7.9 mg/dL — ABNORMAL LOW (ref 8.9–10.3)
Chloride: 96 mmol/L — ABNORMAL LOW (ref 98–111)
Creatinine, Ser: 1.96 mg/dL — ABNORMAL HIGH (ref 0.44–1.00)
GFR calc Af Amer: 28 mL/min — ABNORMAL LOW (ref >60)
GFR calc non Af Amer: 24 mL/min — ABNORMAL LOW (ref >60)
Glucose, Bld: 135 mg/dL — ABNORMAL HIGH (ref 70–99)
Phosphorus: 3.2 mg/dL (ref 2.5–4.6)
Potassium: 3.6 mmol/L (ref 3.5–5.1)
Sodium: 134 mmol/L — ABNORMAL LOW (ref 135–145)

## 2020-03-04 LAB — BASIC METABOLIC PANEL
Anion gap: 17 — ABNORMAL HIGH (ref 5–15)
BUN: 56 mg/dL — ABNORMAL HIGH (ref 8–23)
CO2: 22 mmol/L (ref 22–32)
Calcium: 8.3 mg/dL — ABNORMAL LOW (ref 8.9–10.3)
Chloride: 95 mmol/L — ABNORMAL LOW (ref 98–111)
Creatinine, Ser: 2.91 mg/dL — ABNORMAL HIGH (ref 0.44–1.00)
GFR calc Af Amer: 17 mL/min — ABNORMAL LOW (ref >60)
GFR calc non Af Amer: 15 mL/min — ABNORMAL LOW (ref >60)
Glucose, Bld: 150 mg/dL — ABNORMAL HIGH (ref 70–99)
Potassium: 4 mmol/L (ref 3.5–5.1)
Sodium: 134 mmol/L — ABNORMAL LOW (ref 135–145)

## 2020-03-04 LAB — CBC
HCT: 33.2 % — ABNORMAL LOW (ref 36.0–46.0)
Hemoglobin: 10.3 g/dL — ABNORMAL LOW (ref 12.0–15.0)
MCH: 31.5 pg (ref 26.0–34.0)
MCHC: 31 g/dL (ref 30.0–36.0)
MCV: 101.5 fL — ABNORMAL HIGH (ref 80.0–100.0)
Platelets: 209 10*3/uL (ref 150–400)
RBC: 3.27 MIL/uL — ABNORMAL LOW (ref 3.87–5.11)
RDW: 18 % — ABNORMAL HIGH (ref 11.5–15.5)
WBC: 57.8 10*3/uL (ref 4.0–10.5)
nRBC: 0.4 % — ABNORMAL HIGH (ref 0.0–0.2)

## 2020-03-04 LAB — MAGNESIUM: Magnesium: 2.6 mg/dL — ABNORMAL HIGH (ref 1.7–2.4)

## 2020-03-04 LAB — URINE CULTURE: Culture: 20000 — AB

## 2020-03-04 LAB — GLUCOSE, CAPILLARY: Glucose-Capillary: 115 mg/dL — ABNORMAL HIGH (ref 70–99)

## 2020-03-04 LAB — PHOSPHORUS: Phosphorus: 4.9 mg/dL — ABNORMAL HIGH (ref 2.5–4.6)

## 2020-03-04 MED ORDER — LIDOCAINE HCL (PF) 1 % IJ SOLN: 5.0000 mL | INTRAMUSCULAR | Status: DC | PRN

## 2020-03-04 MED ORDER — ATROPINE SULFATE 1 MG/10ML IJ SOSY
PREFILLED_SYRINGE | INTRAMUSCULAR | Status: AC
  Filled 2020-03-04: qty 10

## 2020-03-04 MED ORDER — LIDOCAINE-PRILOCAINE 2.5-2.5 % EX CREA
1.0000 "application " | TOPICAL_CREAM | CUTANEOUS | Status: DC | PRN
  Filled 2020-03-04: qty 5

## 2020-03-04 MED ORDER — PENTAFLUOROPROP-TETRAFLUOROETH EX AERO: 1.0000 "application " | INHALATION_SPRAY | CUTANEOUS | Status: DC | PRN

## 2020-03-04 MED ORDER — NOREPINEPHRINE 4 MG/250ML-% IV SOLN: 0.0000 ug/min | INTRAVENOUS | Status: DC

## 2020-03-04 MED ORDER — SODIUM CHLORIDE 0.9 % IV SOLN: 100.0000 mL | INTRAVENOUS | Status: DC | PRN

## 2020-03-04 MED ORDER — ALTEPLASE 2 MG IJ SOLR
2.0000 mg | Freq: Once | INTRAMUSCULAR | Status: DC | PRN
  Filled 2020-03-04: qty 2

## 2020-03-04 MED ORDER — HEPARIN SODIUM (PORCINE) 1000 UNIT/ML DIALYSIS: 1000.0000 [IU] | INTRAMUSCULAR | Status: DC | PRN

## 2020-03-04 MED ORDER — AMIODARONE HCL IN DEXTROSE 360-4.14 MG/200ML-% IV SOLN
INTRAVENOUS | Status: AC
  Filled 2020-03-04: qty 200

## 2020-03-04 MED FILL — Medication: Qty: 1 | Status: AC

## 2020-03-04 DEATH — deceased

## 2020-03-05 LAB — CULTURE, BLOOD (SINGLE)

## 2020-03-05 LAB — BLOOD CULTURE ID PANEL (REFLEXED)

## 2020-03-06 LAB — CULTURE, BLOOD (ROUTINE X 2)

## 2020-03-07 LAB — CULTURE, BLOOD (SINGLE): Specimen Description: ADEQUATE

## 2020-03-08 LAB — CULTURE, BLOOD (ROUTINE X 2)
Culture: NO GROWTH
Special Requests: ADEQUATE

## 2020-04-04 NOTE — Death Summary Note (Addendum)
DEATH SUMMARY   Patient Details  Name: Taylor Tyler MRN: [id number] DOB: 1940-12-29  Admission/Discharge Information   Admit Date:  2020-03-18  Date of Death:  2020/03/20  Time of Death:  0545  Length of Stay: 2  Referring Physician: McLean-Scocuzza, Nino Glow, MD   Reason(s) for Hospitalization  Severe sepsis secondary to spinal wound  Diagnoses  Preliminary cause of death: Cardiac arrest secondary to septic shock secondary presacral abscess and chronic wound infection Secondary Diagnoses (including complications and co-morbidities):  Active Problems:   Small bowel obstruction (HCC)   Septic shock (HCC)   Cardiac arrest, cause unspecified [facility])   Brief Hospital Course (including significant findings, care, treatment, and services provided and events leading to death)  Taylor Tyler is a 79 y.o. year old female with ESRD, Type 2 DM, diverticulitis with perforation and partial colectomy in 2013-11-09, G-tube, chronic spinal infections following L-spine decompression and fusion with subsequent wound infection and multiple unsuccessful debridements and wound VAC placements was admitted for severe sepsis secondary to chronic non-healing spinal wound and pre-sacral abscess seen on CT. She also had evidence of small bowel obstruction on imaging. Treatment included IVF, antibiotics, vasopressor support and dialysis. Neurosurgery, general surgery and IR were consulted for source control however patient was deemed non-surgical candidate. On Mar 20, 2020, she had cardiac arrhythmias ultimately leading to PEA arrest. CPR was performed for 30 minutes. She had ROSC with bradycardia however blood pressure continued to decline and was unresponsive to further medications. Family was contacted regarding her condition and expressed understanding that further care would be futile. She expired at 0545 AM.  Pertinent Labs and Studies  Significant Diagnostic Studies CT ABDOMEN PELVIS WO CONTRAST  Result Date:  03/18/20 CLINICAL DATA:  Fecal material in feeding tube.  Sacral wounds. EXAM: CT ABDOMEN AND PELVIS WITHOUT CONTRAST TECHNIQUE: Multidetector CT imaging of the abdomen and pelvis was performed following the standard protocol without IV contrast. COMPARISON:  Dec 11, 2019. FINDINGS: Lower chest: No acute abnormality. Hepatobiliary: No focal liver abnormality is seen. Status post cholecystectomy. No biliary dilatation. Pancreas: Unremarkable. No pancreatic ductal dilatation or surrounding inflammatory changes. Spleen: Normal in size without focal abnormality. Adrenals/Urinary Tract: Adrenal glands appear normal. Nonobstructive left renal calculus is noted. No hydronephrosis or renal obstruction is noted. Urinary bladder is decompressed. Stomach/Bowel: Gastrostomy tube is seen within the gastric lumen. The stomach is mild-to-moderately distended and fluid-filled. There is interval development of significant proximal small bowel dilatation is noted, with several loops of small bowel seen in large ventral hernia in the pelvis. The possibility of obstruction secondary to this herniated bowel cannot be excluded. There also appears to be a portion of transverse colon extending into large supraumbilical hernia, but it does not appear to be resulting in obstruction. Vascular/Lymphatic: Aortic atherosclerosis. No enlarged abdominal or pelvic lymph nodes. Reproductive: Status post hysterectomy. No adnexal masses. Other: 7.3 x 3.8 cm fluid collection is now seen in presacral space which contains air in its more superior portion, and is concerning for possible abscess or postoperative seroma. Large decubitus ulcer is seen overlying the distal portion of the sacrum and extending inferiorly, which is significantly enlarged compared to prior exam. Musculoskeletal: Extensive postsurgical changes are seen involving the lumbar spine, including laminectomies of L4, L5 and S1. Status post surgical anterior fusion of L5-S1. Patient  appears to have undergone intrapedicular screw removal at the L3, L4 and L5 levels. There is a large fluid collection with air-fluid level in the soft tissues posterior to the lower  lumbar spine extending from L3-S1. IMPRESSION: 1. Interval development of significant proximal small bowel dilatation is noted, with several loops of small bowel seen in large ventral hernia in the pelvis. The possibility of obstruction secondary to this herniated bowel cannot be excluded. 2. Significant enlargement of decubitus ulcer which is seen primarily just inferior to the sacrum posteriorly. 3. There also appears to be a portion of transverse colon extending into large supraumbilical hernia, but it does not appear to be resulting in obstruction. 4. 7.3 x 3.8 cm fluid collection is now seen in presacral space which contains air in its more superior portion, and is concerning for possible abscess or postoperative seroma. 5. Extensive postsurgical changes are seen involving the lumbar spine, including laminectomies of L4, L5 and S1. Patient appears to have undergone intrapedicular screw removal at the L3, L4 and L5 levels. There is a large fluid collection with air-fluid level in the soft tissues posterior to the lower lumbar spine extending from L3-S1. This is not significantly changed compared to prior exam. 6. Nonobstructive left renal calculus. No hydronephrosis or renal obstruction is noted. Aortic Atherosclerosis (ICD10-I70.0). Electronically Signed   By: Marijo Conception M.D.   On: 03/01/2020 10:42   CT Head Wo Contrast  Result Date: 02/23/2020 CLINICAL DATA:  Altered mental status. EXAM: CT HEAD WITHOUT CONTRAST TECHNIQUE: Contiguous axial images were obtained from the base of the skull through the vertex without intravenous contrast. COMPARISON:  12/11/2019 FINDINGS: Brain: Generalized atrophy. Chronic small-vessel ischemic changes of the cerebral hemispheric white matter. No sign of recent infarction, mass lesion,  hemorrhage, hydrocephalus or extra-axial collection. Vascular: There is atherosclerotic calcification of the major vessels at the base of the brain. Skull: Negative Sinuses/Orbits: Clear/normal Other: None IMPRESSION: No acute intracranial finding by CT. Atrophy and chronic small-vessel ischemic changes as seen previously. Electronically Signed   By: Nelson Chimes M.D.   On: 02/04/2020 10:35   CT L-SPINE NO CHARGE  Result Date: 02/07/2020 CLINICAL DATA:  Altered mental status.  Back pain. EXAM: CT LUMBAR SPINE WITHOUT CONTRAST TECHNIQUE: Multidetector CT imaging of the lumbar spine was performed without intravenous contrast administration. Multiplanar CT image reconstructions were also generated. COMPARISON:  12/19/2019.  06/17/2019. FINDINGS: Segmentation: 5 lumbar type vertebral bodies as numbered previously. Alignment: Anterolisthesis at L4-5 of 8 mm now present. Vertebrae: Anterior fusion at L5-S1. Removal of posterior hardware previously present from L3 to the sacrum. Screw shadows remain visible. Probable solid union at L3-4. Probable nonunion at L4-5 with partial anterior extrusion of the interbody spacer. Some endplate irregularity. Possibility of infection is not excluded at this level. Solid union at L5-S1. Paraspinal and other soft tissues: Fluid collection posteriorly from L3 to the sacrum, containing air or gas, worrisome for infection absent recent intervention. Disc levels: No significant finding seen at T12-L1 or L1-2. L2-3: Disc bulge. Facet degeneration and hypertrophy. Stenosis of the lateral recesses. L3 to sacrum: Previous posterior decompression and surgical changes as above. IMPRESSION: Status post posterior fusion hardware removal from L3 to the sacrum. Previous anterior discectomy and fusion procedure L5-S1 appears solid. L3-4 fusion appears solid. Probable nonunion at L4-5 with development of 8 mm of anterolisthesis and partial anterior extrusion of the interbody spacer. Some endplate  irregularity. Infection at this level not excluded. Posterior fluid collection in the region of the surgery with air/gas, suggesting possibility of infection, absent performance of recent surgery. Electronically Signed   By: Nelson Chimes M.D.   On: 02/07/2020 10:34   DG Chest [facility]  1 View  Result Date: 02/24/2020 CLINICAL DATA:  Altered mental status, end-stage renal disease, diabetes mellitus, hypertension EXAM: PORTABLE CHEST 1 VIEW COMPARISON:  Portable exam 0939 hours compared to 12/12/2019 FINDINGS: RIGHT jugular dual-lumen catheter with tip projecting over RIGHT atrium. Enlargement of cardiac silhouette with pulmonary vascular congestion. Atherosclerotic calcification aorta. Decreased lung volumes with bibasilar atelectasis. Slight crowding of markings in the perihilar regions versus minimal pulmonary edema. No pleural effusion or pneumothorax. IMPRESSION: Decreased lung volumes with bibasilar atelectasis. Enlargement of cardiac silhouette. Cannot exclude minimal perihilar edema. Electronically Signed   By: Lavonia Dana M.D.   On: 02/10/2020 09:48   DG Abd Portable 1V-Small Bowel Obstruction Protocol-initial, 8 hr delay  Result Date: 03/03/2020 CLINICAL DATA:  79 year old female with nasogastric tube present. Evaluate small bowel obstruction. EXAM: PORTABLE ABDOMEN - 1 VIEW COMPARISON:  Prior abdominal radiograph 02/03/2020 FINDINGS: Gaseous dilation of loops of small bowel in the mid abdomen measuring up to 4.8 cm in diameter. A percutaneous gastrostomy tube projects over the stomach. A central venous catheter projects over the right anatomic pelvis with the tip terminating in the region of the right common iliac vein. Incompletely imaged tunneled hemodialysis catheter with the tip projecting over the right atrium. A lateral decubitus view is included. No evidence of free air. IMPRESSION: 1. Gas-filled dilated loops of small bowel consistent with the clinical history of small-bowel obstruction. Maximal  small bowel diameter 4.8 cm. 2. Percutaneous gastrostomy tube and nasogastric tube appear to be in good position. 3. Incompletely imaged right femoral approach central venous catheter and tunneled hemodialysis catheter. Electronically Signed   By: Jacqulynn Cadet M.D.   On: 03/03/2020 09:36   DG Abd Portable 1V-Small Bowel Protocol-Position Verification  Result Date: 02/20/2020 CLINICAL DATA:  NG tube placement. EXAM: PORTABLE ABDOMEN - 1 VIEW COMPARISON:  CT earlier this day. FINDINGS: Tip and side port of the enteric tube below the diaphragm in the stomach. Gastrostomy tube projects over the left upper quadrant. Dialysis catheter in the right lower chest. IMPRESSION: Tip and side port of the enteric tube below the diaphragm in the stomach. Electronically Signed   By: Keith Rake M.D.   On: 02/15/2020 23:59    Microbiology Recent Results (from the past 240 hour(s))  Blood culture (routine single)     Status: None (Preliminary result)   Collection Time: 02/22/2020  9:29 AM   Specimen: BLOOD  Result Value Ref Range Status   Specimen Description BLOOD BLOOD LEFT FOREARM  Final   Special Requests   Final    BOTTLES DRAWN AEROBIC AND ANAEROBIC Blood Culture results may not be optimal due to an excessive volume of blood received in culture bottles   Culture  Setup Time   Final    Organism ID to follow IN BOTH AEROBIC AND ANAEROBIC BOTTLES GRAM POSITIVE COCCI CRITICAL RESULT CALLED TO, READ BACK BY AND VERIFIED WITH: KATHY PIERCE AT [place] 03/03/20.PMF Performed at [facility], [address] [place], [place] [address]    Culture [facility] POSITIVE COCCI  Final   Report Status PENDING  Incomplete  Blood Culture ID Panel (Reflexed)     Status: Abnormal   Collection Time: 02/28/2020  9:29 AM  Result Value Ref Range Status   Enterococcus species DETECTED (A) NOT DETECTED Final    Comment: CRITICAL RESULT CALLED TO, READ BACK BY AND VERIFIED WITH: KATHY PIERCE AT 0746 03/03/20.PMF     Vancomycin resistance DETECTED (A) NOT DETECTED Final    Comment: CRITICAL RESULT CALLED  TO, READ BACK BY AND VERIFIED WITH: KATHY PIERCE AT 5427 03/03/20.PMF    Listeria monocytogenes NOT DETECTED NOT DETECTED Final   Staphylococcus species NOT DETECTED NOT DETECTED Final   Staphylococcus aureus (BCID) NOT DETECTED NOT DETECTED Final   Streptococcus species NOT DETECTED NOT DETECTED Final   Streptococcus agalactiae NOT DETECTED NOT DETECTED Final   Streptococcus pneumoniae NOT DETECTED NOT DETECTED Final   Streptococcus pyogenes NOT DETECTED NOT DETECTED Final   Acinetobacter baumannii NOT DETECTED NOT DETECTED Final   Enterobacteriaceae species NOT DETECTED NOT DETECTED Final   Enterobacter cloacae complex NOT DETECTED NOT DETECTED Final   Escherichia coli NOT DETECTED NOT DETECTED Final   Klebsiella oxytoca NOT DETECTED NOT DETECTED Final   Klebsiella pneumoniae NOT DETECTED NOT DETECTED Final   Proteus species NOT DETECTED NOT DETECTED Final   Serratia marcescens NOT DETECTED NOT DETECTED Final   Haemophilus influenzae NOT DETECTED NOT DETECTED Final   Neisseria meningitidis NOT DETECTED NOT DETECTED Final   Pseudomonas aeruginosa NOT DETECTED NOT DETECTED Final   Candida albicans NOT DETECTED NOT DETECTED Final   Candida glabrata NOT DETECTED NOT DETECTED Final   Candida krusei NOT DETECTED NOT DETECTED Final   Candida parapsilosis NOT DETECTED NOT DETECTED Final   Candida tropicalis NOT DETECTED NOT DETECTED Final    Comment: Performed at [facility], [address] [place], [place] [address]  SARS Coronavirus 2 by RT PCR (hospital order, performed in [place] hospital lab) Nasopharyngeal Nasopharyngeal Swab     Status: None   Collection Time: 02/25/2020 10:50 AM   Specimen: Nasopharyngeal Swab  Result Value Ref Range Status   SARS Coronavirus 2 NEGATIVE NEGATIVE Final    Comment: (NOTE) SARS-CoV-2 target nucleic acids are NOT DETECTED.  The SARS-CoV-2 RNA is  generally detectable in upper and lower respiratory specimens during the acute phase of infection. The lowest concentration of SARS-CoV-2 viral copies this assay can detect is 250 copies / mL. A negative result does not preclude SARS-CoV-2 infection and should not be used as the sole basis for treatment or other patient management decisions.  A negative result may occur with improper specimen collection / handling, submission of specimen other than nasopharyngeal swab, presence of viral mutation(s) within the areas targeted by this assay, and inadequate number of viral copies (<250 copies / mL). A negative result must be combined with clinical observations, patient history, and epidemiological information.  Fact Sheet for Patients:   StrictlyIdeas.no  Fact Sheet for Healthcare Providers: BankingDealers.co.za  This test is not yet approved or  cleared by the [place] FDA and has been authorized for detection and/or diagnosis of SARS-CoV-2 by FDA under an Emergency Use Authorization (EUA).  This EUA will remain in effect (meaning this test can be used) for the duration of the COVID-19 declaration under Section 564(b)(1) of the Act, 21 U.S.C. section 360bbb-3(b)(1), unless the authorization is terminated or revoked sooner.  Performed at [facility], [address] [place], [place] [address]   Blood culture (single)     Status: None (Preliminary result)   Collection Time: 02/24/2020 11:51 AM   Specimen: BLOOD  Result Value Ref Range Status   Specimen Description BLOOD Blood Culture adequate volume  Final   Special Requests NONE  Final   Culture  Setup Time   Final    GRAM POSITIVE COCCI IN BOTH AEROBIC AND ANAEROBIC BOTTLES CRITICAL RESULT CALLED TO, READ BACK BY AND VERIFIED WITH: [facility] PIERCE AT 5176 03/03/20.PMF Performed at [facility]  [facility], [address] [place], [place] [address]    Culture GRAM POSITIVE  COCCI  Final   Report Status PENDING  Incomplete  MRSA PCR Screening     Status: None   Collection Time: 02/05/2020 10:40 PM   Specimen: Nasopharyngeal  Result Value Ref Range Status   MRSA by PCR NEGATIVE NEGATIVE Final    Comment:        The GeneXpert MRSA Assay (FDA approved for NASAL specimens only), is one component of a comprehensive MRSA colonization surveillance program. It is not intended to diagnose MRSA infection nor to guide or monitor treatment for MRSA infections. Performed at [facility], [place] [address], [place], [place] [address]     Lab Basic Metabolic Panel: Recent Labs  Lab 02/07/2020 [phone] 02/16/2020 1732 02/19/2020 2303 03/03/20 0351 Mar 23, 2020 0322 03-23-2020 0355  NA 135 135  --  136 134* 134*  K 4.0 3.5  --  3.4* 4.0 3.6  CL 95*  --   --  99 95* 96*  CO2 21*  --   --  23 22 24   GLUCOSE 134*  --   --  147* 150* 135*  BUN 36*  --   --  43* 56* 32*  CREATININE 2.17*  --  2.30* 2.35* 2.91* 1.96*  CALCIUM 9.0  --   --  8.6* 8.3* 7.9*  MG  --   --   --  2.4 2.6*  --   PHOS  --   --   --  4.4 4.9* 3.2   Liver Function Tests: Recent Labs  Lab 02/11/2020 0929 Mar 23, 2020 0355  AST 40  --   ALT 22  --   ALKPHOS 90  --   BILITOT 0.7  --   PROT 5.9*  --   ALBUMIN 2.0* 1.4*   No results for input(s): LIPASE, AMYLASE in the last 168 hours. Recent Labs  Lab 02/14/2020 0929  AMMONIA 18   CBC: Recent Labs  Lab 02/10/2020 0929 02/25/2020 1732 03/03/2020 2303 03/03/20 0351 23-Mar-2020 0322  WBC 39.1*  --  48.7* 45.7* 57.8*  NEUTROABS 33.0*  --   --   --   --   HGB 11.3* 12.2 10.3* 9.7* 10.3*  HCT 36.4 36.0 33.9* 32.1* 33.2*  MCV 102.0*  --  102.1* 102.2* 101.5*  PLT 257  --  213 195 209   Cardiac Enzymes: No results for input(s): CKTOTAL, CKMB, CKMBINDEX, TROPONINI in the last 168 hours. Sepsis Labs: Recent Labs  Lab 02/04/2020 0929 02/14/2020 0929 02/07/2020 1129 02/21/2020 1359 02/23/2020 1722 02/07/2020 2303 03/03/20 0351 Mar 23, 2020 0322  WBC 39.1*  --   --    --   --  48.7* 45.7* 57.8*  LATICACIDVEN 6.3*   < > 7.9* 6.4* 5.0* 3.5*  --   --    < > = values in this interval not displayed.    Procedures/Operations  R IJ HD R Femoral CVC   Asia Favata Rodman Pickle 03-23-20, 5:46 AM

## 2020-04-04 NOTE — Procedures (Signed)
Intubation Procedure Note  [name]  [id number]  05/23/1941  Date:2020-03-24  Time:04:41 AM   Provider Performing: Leigh Aurora, BS, RRT, RCP    Procedure: Intubation (95747)  Indication(s) Respiratory Failure, CODE BLUE  Consent Unable to obtain consent due to emergent nature of procedure. CODE BLUE/ACLS Protocol   Anesthesia NONE   Time Out Verified patient identification, verified procedure, site/side was marked, verified correct patient position, special equipment/implants available, medications/allergies/relevant history reviewed, required imaging and test results available.   Sterile Technique Usual hand hygeine, masks, and gloves were used   Procedure Description  Patient was intubated in the setting of a CODE BLUE, APNEA note. Airway difficulty was not anticipated. Patient was intubated by (SELF) on one attempt. ETT clearly visualized and passed through the Vocal Cords. Grade 2 View noted. positive color change noted. BBS to auscultation were Scattered Rhonchi bilaterally.  Capnography was 33 on the zoll. Condensation noted in the ETT.   Complications/Tolerance None; patient tolerated the procedure well. Chest X-ray is pending to verify placement.  Taylor Tyler, BS, RRT, RCP

## 2020-04-04 NOTE — Plan of Care (Signed)
PCCM Interval Note  Called to bedside for code blue. For full details, refer to code sheet. Briefly, patient was reported to be hypotensive on dialysis. Initial rhythm seen by staff was VT with pulse followed by bradycardia. Given atropine 0.5 mg. No improvement in HR was seen. Pulse was lost and CPR was initiated. CPR was performed for 30 minutes and the last pulse check was deemed PEA. After son was updated, staff noted thready pulse however pulse continued to diminish and associated with decline in BP. I called the son regarding her condition and we agreed to administer morphine at this time to address any pain or discomfort patient is having as she is actively dying.   Patient expired 0545 on 03/21/20.  Rodman Pickle, M.D. [facility] Pulmonary/Critical Care Medicine 21-Mar-2020 5:45 AM

## 2020-04-04 NOTE — Progress Notes (Signed)
HD tx not achieved as scheduled  due to patient being hypotensive.

## 2020-04-04 NOTE — Progress Notes (Signed)
Time of death called by Dr Loanne Drilling at [phone]. This RN and Technical brewer, RN assessed apical heart sounds for one minute. No heart tones auscultated. Son at the bedside at this time. Will continue to offer support.

## 2020-04-04 NOTE — Progress Notes (Signed)
Chaplain responded to Code Blue.  No family present.  Chaplain not needed at this time.  De Burrs Chaplain Resident

## 2020-04-04 NOTE — Progress Notes (Signed)
CRITICAL VALUE ALERT  Critical Value:  WBC 57.8  Date & Time Notied:  22-Mar-2020 3:59 AM   Provider Notified: Dr. Oletta Darter  Orders Received/Actions taken: Awaiting orders

## 2020-04-04 DEATH — deceased

## 2021-02-12 IMAGING — MR MR LUMBAR SPINE W/O CM
4 of 5 series · 23 of 48 positions shown · non-contrast
Comparison: 06/05/2018

CLINICAL DATA: Right buttock and hip pain. Surgery in June 2018
with nonhealing wound.

EXAM:
MRI LUMBAR SPINE WITHOUT CONTRAST
TECHNIQUE: Multiplanar, multisequence MR imaging of the lumbar spine was
performed. No intravenous contrast was administered.

[Series 4: T2 · sagittal · 4.0mm · 0.44mm/px · 6 of 12 slices shown (1 of 2)]
[im 1/12]
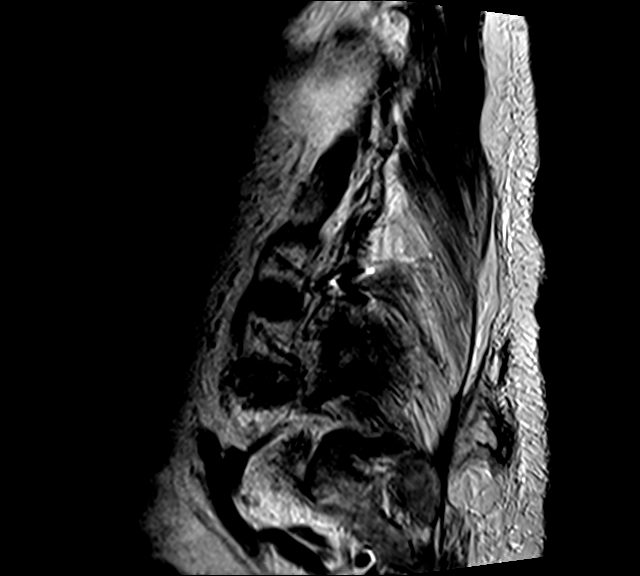
[im 3/12]
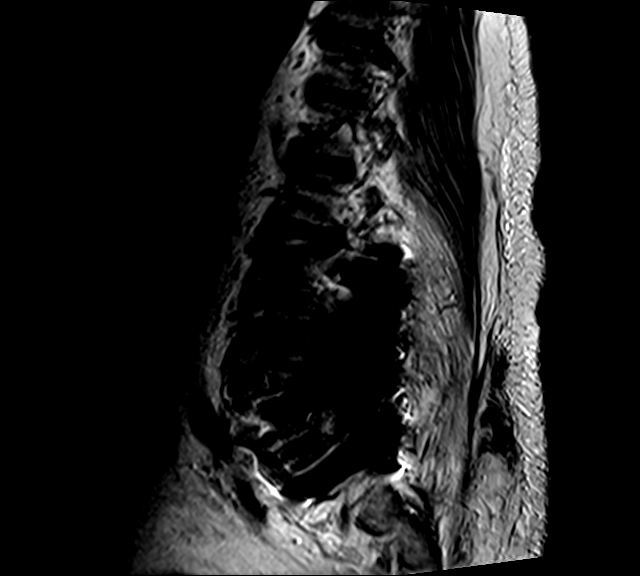
[im 5/12]
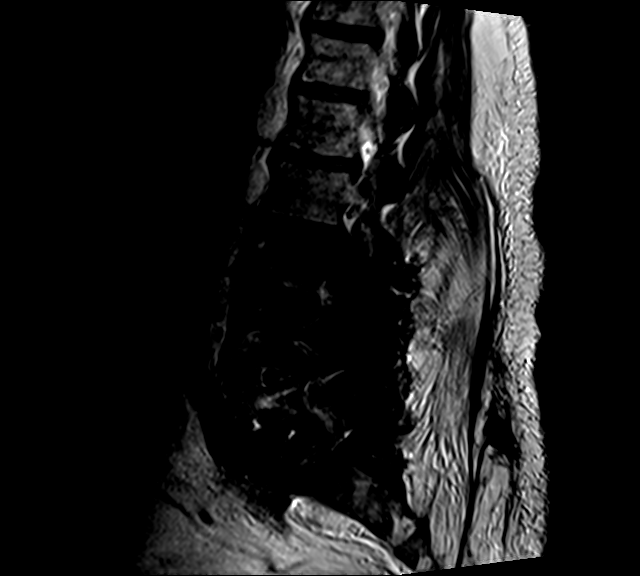
[im 7/12]
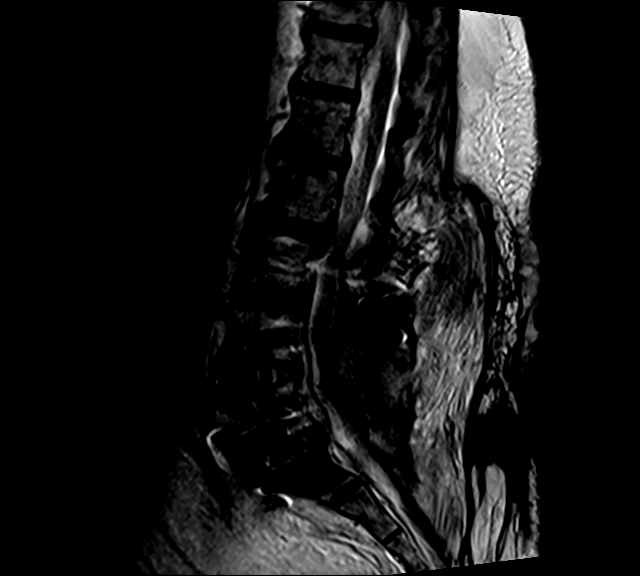
[im 9/12]
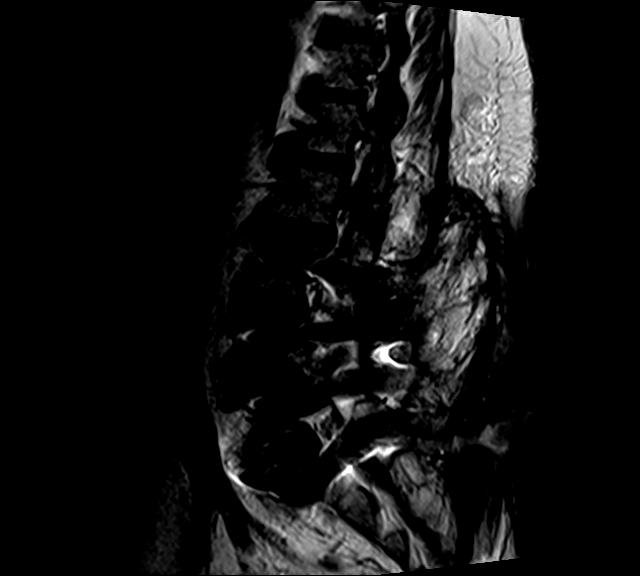
[im 12/12]
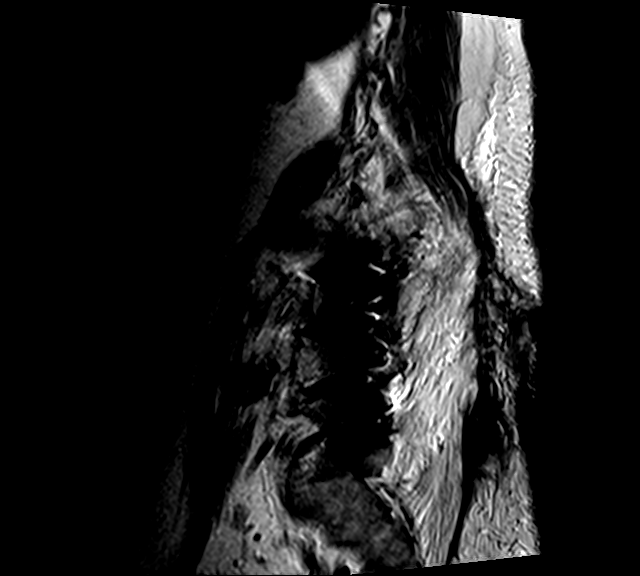

[Series 5: T1 · sagittal · 4.0mm · 0.55mm/px · 5 of 12 slices shown (1 of 2)]
[im 1/12]
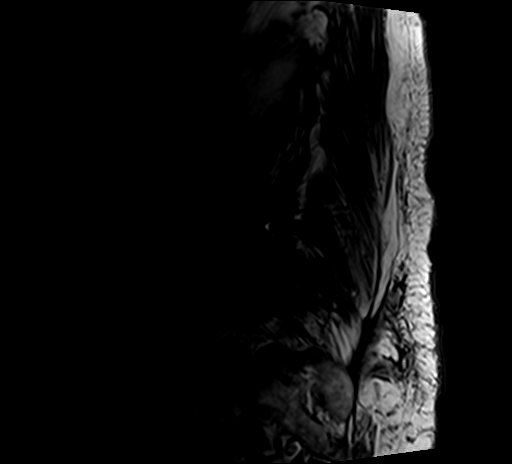
[im 3/12]
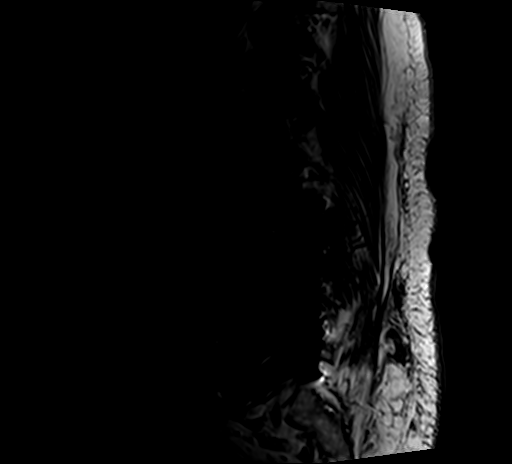
[im 5/12]
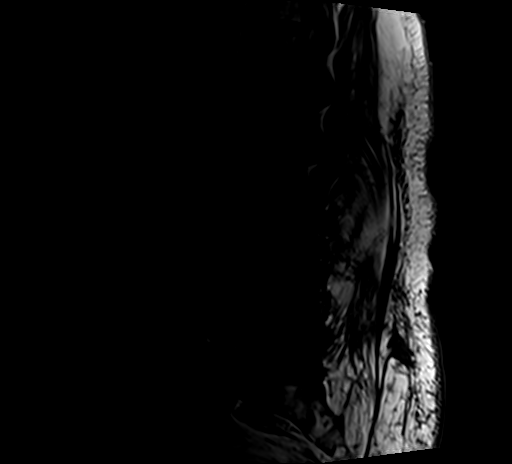
[im 7/12]
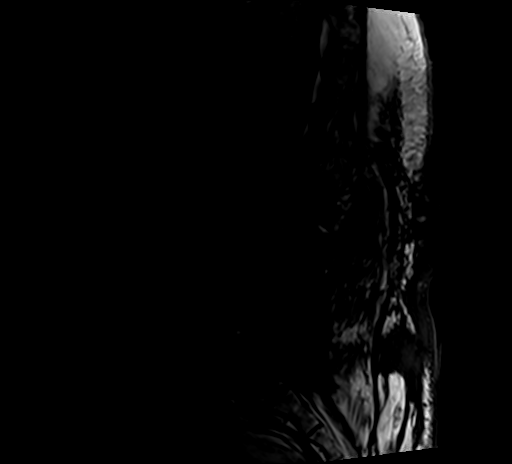
[im 12/12]
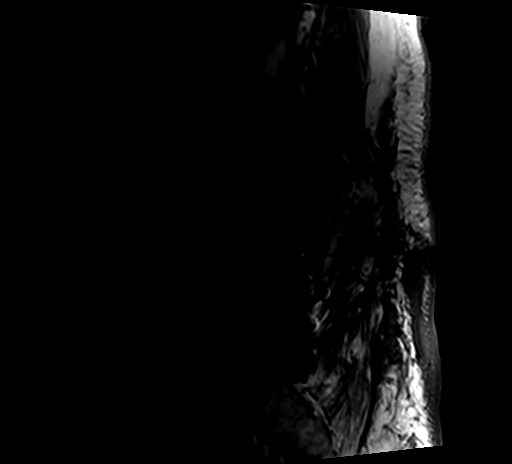

[Series 6: T1 · axial · 4.0mm · 0.37mm/px · z∈[-69,+69]mm · 3 of 32 slices shown (2 of 2)]
[im 5/32]
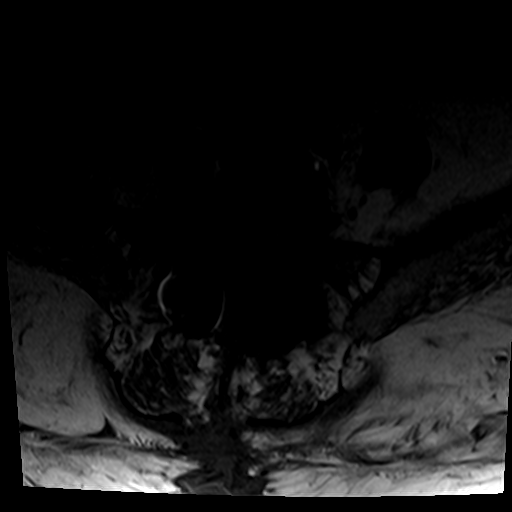
[im 16/32]
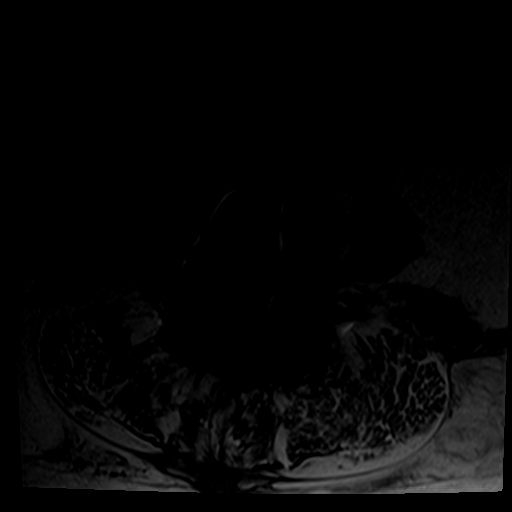
[im 27/32]
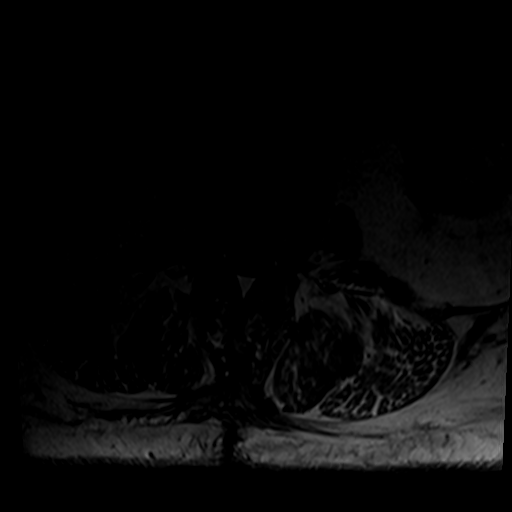

[Series 8: T2 · axial · 4.0mm · 0.74mm/px · z∈[-90,+136]mm · 9 of 32 slices shown (2 of 2)]
[im 1/32]
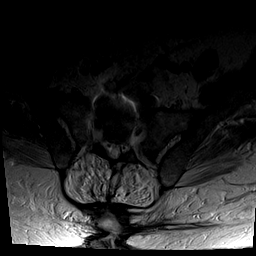
[im 5/32]
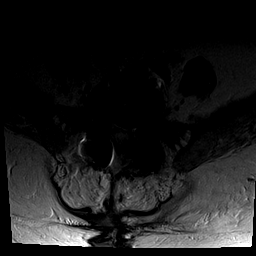
[im 9/32]
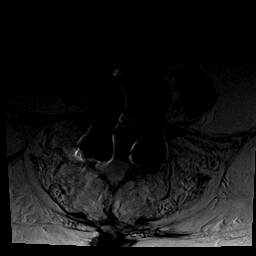
[im 14/32]
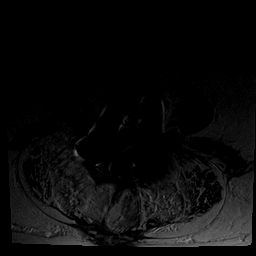
[im 16/32]
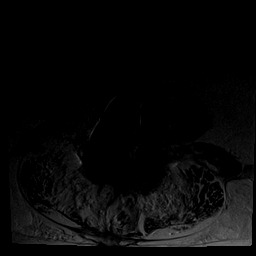
[im 18/32]
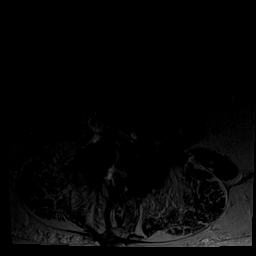
[im 23/32]
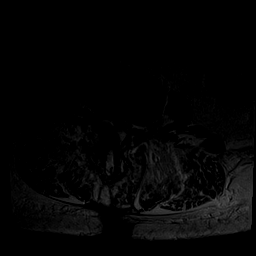
[im 27/32]
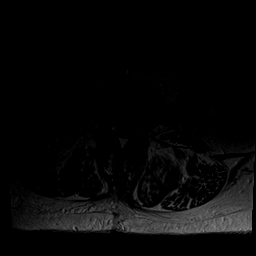
[im 32/32]
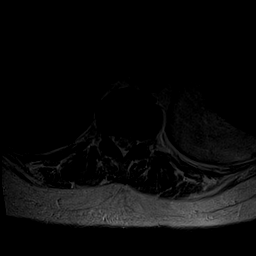

[23 of 48 positions shown; findings below may reference images not displayed]

FINDINGS: Segmentation: The lowest lumbar type non-rib-bearing vertebra is
labeled as L5.

Alignment:  3 mm of grade 1 degenerative retrolisthesis at L2-3.

Vertebrae: Considerable metal artifact from posterolateral rod and
pedicle screw fixation at L3-L4-L5-S1 bilaterally.

Suspected complex fluid collection along the posterior decompression
site measuring about 5.0 by 3.8 by 3.0 cm (volume = 30 cm^3).
There is previously a fluid collection with air-level in the site on
the prior exam from 06/04/2018. On image [DATE] and image [DATE],
possibility of the lower portion of this fluid collection connected
back into the subcutaneous tissues and potentially to the cutaneous
surface is raised, the appearance resembles a sinus tract. Correlate
with any continued leakage. I do not observe continuity of the
posterior fluid collection with the thecal sac itself.

Conus medullaris and cauda equina: Conus extends to the L1 level.
The conus appears normal. I do not see a definite abnormality of the
cauda equina, but the signal loss due to the artifact as well as
motion artifact somewhat obscure the cauda equina.

Paraspinal and other soft tissues: Accentuated T2 signal in the
paraspinal musculature as can be encountered in the postoperative
setting. Subtle presacral edema. I do not see any significant degree
of vertebral body edema to suggest osteomyelitis. Right common iliac
node 0.9 cm in short axis on image [DATE].

Disc levels:

T11-12: No impingement.  Disc bulge noted.

T12-L1: Unremarkable.

L1-2: No impingement, diffuse disc bulge.

L2-3: Moderate to prominent central narrowing of the thecal sac due
to disc bulge and facet arthropathy, similar to prior.

L3-4: No definite impingement.  Fused level.

L4-5: Stable mild to moderate left foraminal stenosis due to
spurring.

L5-S1: No impingement. Fused level. Residual disc material or
central spurring noted.
IMPRESSION: 1. Suspected chronic fluid collection along the posterior
decompression site in the lumbar spine, with appearance suggesting a
potential midline draining sinus tract from the inferior margin of
this process extending back to the cutaneous surface, for example on
image [DATE].
2. Spondylosis and degenerative disc disease lead to stable moderate
to prominent central narrowing of the thecal sac at L2-3 and stable
mild to moderate left foraminal impingement at L4-5.
3. Metal artifact and motion artifact reduced diagnostic sensitivity
and specificity.
4.

## 2021-07-29 IMAGING — DX DG CHEST 1V PORT
1 series · 1 of 1 positions shown · non-contrast
Comparison: 08/07/2019

CLINICAL DATA: Fever. Altered mental status.

EXAM:
PORTABLE CHEST 1 VIEW

[chest ap]
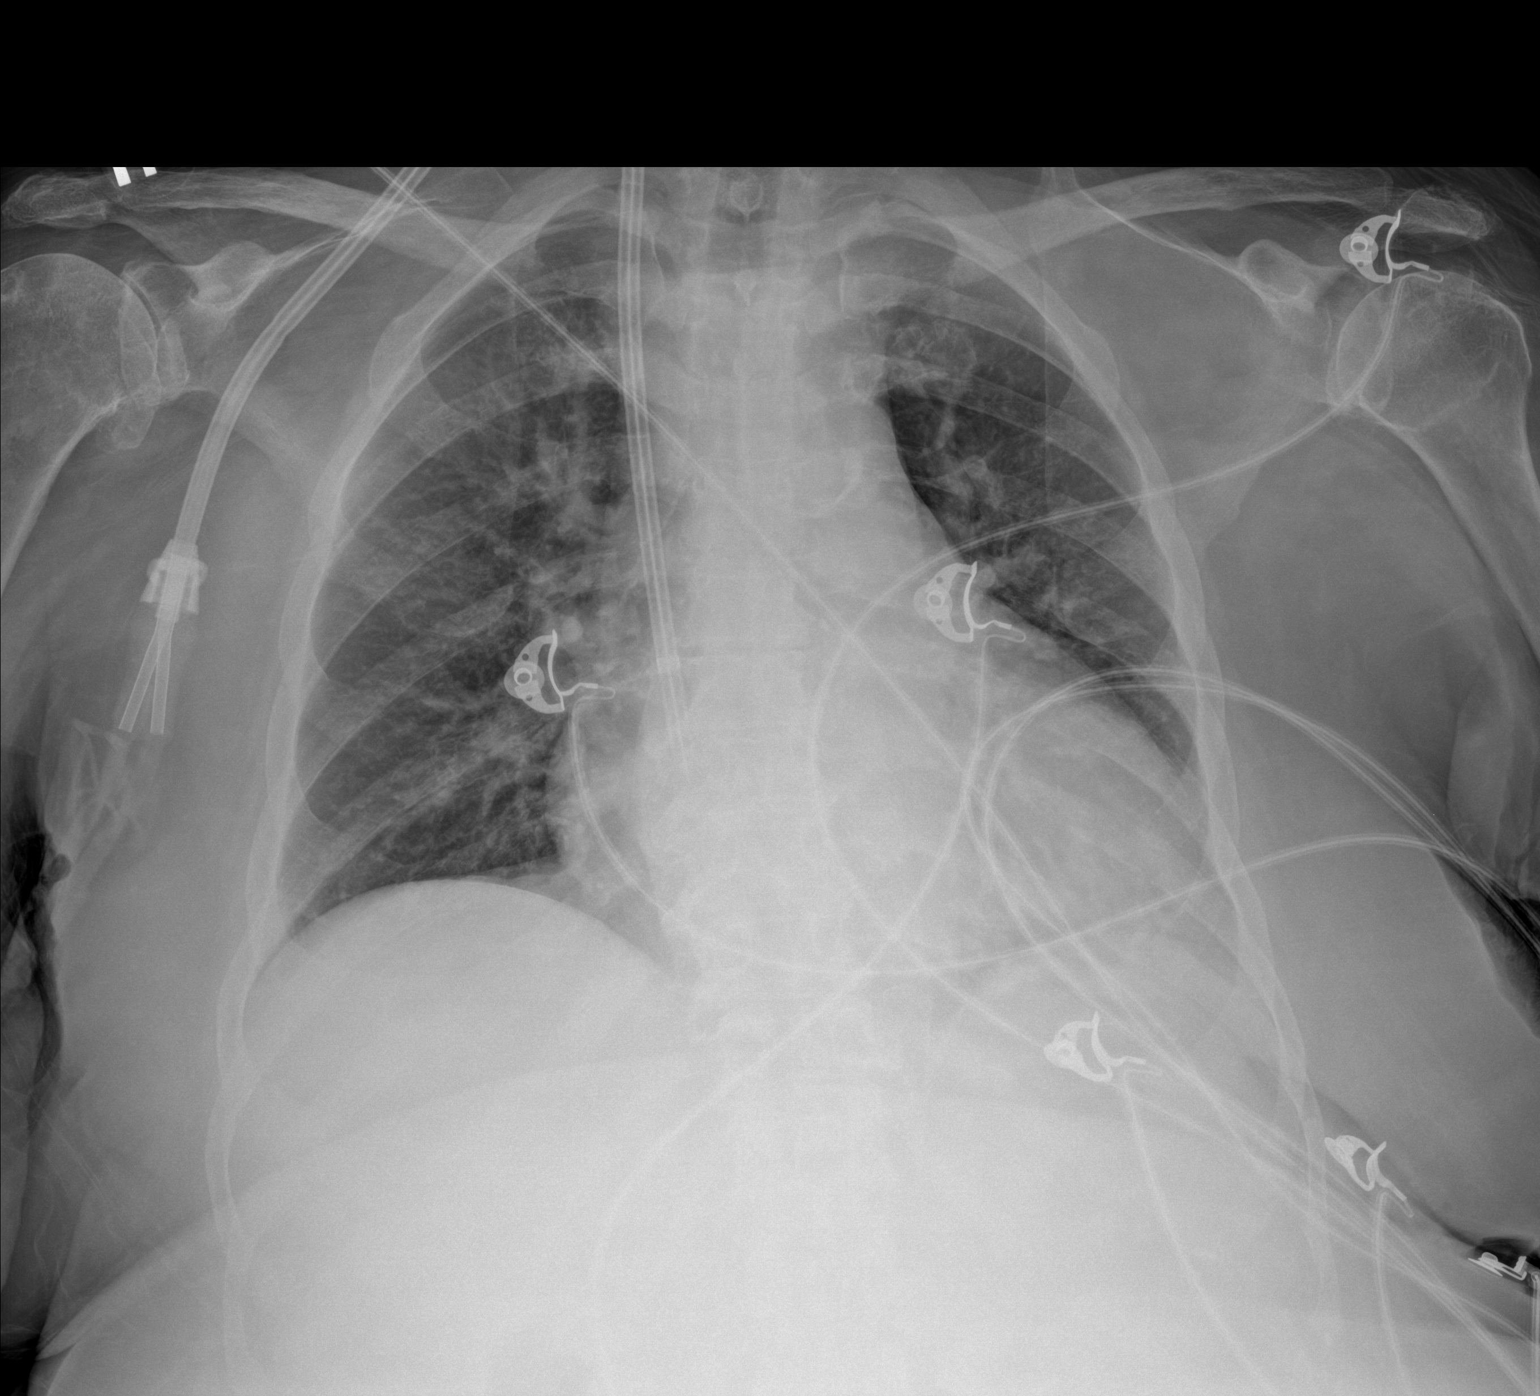

[1 of 1 positions shown; findings below may reference images not displayed]

FINDINGS: Double lumen central venous catheter in place with the tips in the
right atrium, unchanged. Heart size and vascularity are within
normal limits considering the AP portable technique. Lungs are
clear. Aortic atherosclerosis. No effusions. No acute bone
abnormality.
IMPRESSION: No acute disease.
